# Patient Record
Sex: Female | Born: 1955 | Race: White | Hispanic: No | Smoking: Former smoker
Health system: Southern US, Community
[De-identification: ages and names within clinical notes are randomized; demographics above are authoritative.]

## PROBLEM LIST (undated history)

## (undated) DIAGNOSIS — J449 Chronic obstructive pulmonary disease, unspecified: Secondary | ICD-10-CM

## (undated) DIAGNOSIS — K219 Gastro-esophageal reflux disease without esophagitis: Secondary | ICD-10-CM

## (undated) DIAGNOSIS — I5189 Other ill-defined heart diseases: Secondary | ICD-10-CM

## (undated) DIAGNOSIS — K08109 Complete loss of teeth, unspecified cause, unspecified class: Secondary | ICD-10-CM

## (undated) DIAGNOSIS — C3490 Malignant neoplasm of unspecified part of unspecified bronchus or lung: Secondary | ICD-10-CM

## (undated) DIAGNOSIS — R911 Solitary pulmonary nodule: Secondary | ICD-10-CM

## (undated) DIAGNOSIS — R7303 Prediabetes: Secondary | ICD-10-CM

## (undated) DIAGNOSIS — E119 Type 2 diabetes mellitus without complications: Secondary | ICD-10-CM

## (undated) DIAGNOSIS — Z972 Presence of dental prosthetic device (complete) (partial): Secondary | ICD-10-CM

## (undated) DIAGNOSIS — F329 Major depressive disorder, single episode, unspecified: Secondary | ICD-10-CM

## (undated) DIAGNOSIS — J189 Pneumonia, unspecified organism: Secondary | ICD-10-CM

## (undated) DIAGNOSIS — Z8719 Personal history of other diseases of the digestive system: Secondary | ICD-10-CM

## (undated) DIAGNOSIS — F32A Depression, unspecified: Secondary | ICD-10-CM

## (undated) DIAGNOSIS — E785 Hyperlipidemia, unspecified: Secondary | ICD-10-CM

## (undated) DIAGNOSIS — I2510 Atherosclerotic heart disease of native coronary artery without angina pectoris: Secondary | ICD-10-CM

## (undated) DIAGNOSIS — Z9981 Dependence on supplemental oxygen: Secondary | ICD-10-CM

## (undated) DIAGNOSIS — I272 Pulmonary hypertension, unspecified: Secondary | ICD-10-CM

## (undated) DIAGNOSIS — Z8614 Personal history of Methicillin resistant Staphylococcus aureus infection: Secondary | ICD-10-CM

## (undated) DIAGNOSIS — IMO0001 Reserved for inherently not codable concepts without codable children: Secondary | ICD-10-CM

## (undated) DIAGNOSIS — I509 Heart failure, unspecified: Secondary | ICD-10-CM

## (undated) DIAGNOSIS — J42 Unspecified chronic bronchitis: Secondary | ICD-10-CM

## (undated) DIAGNOSIS — M1990 Unspecified osteoarthritis, unspecified site: Secondary | ICD-10-CM

## (undated) DIAGNOSIS — E669 Obesity, unspecified: Secondary | ICD-10-CM

## (undated) HISTORY — DX: Hyperlipidemia, unspecified: E78.5

## (undated) HISTORY — DX: Solitary pulmonary nodule: R91.1

## (undated) HISTORY — PX: APPENDECTOMY: SHX54

## (undated) HISTORY — DX: Obesity, unspecified: E66.9

## (undated) HISTORY — PX: NISSEN FUNDOPLICATION: SHX2091

## (undated) HISTORY — DX: Prediabetes: R73.03

## (undated) HISTORY — DX: Personal history of Methicillin resistant Staphylococcus aureus infection: Z86.14

## (undated) HISTORY — DX: Chronic obstructive pulmonary disease, unspecified: J44.9

## (undated) HISTORY — DX: Atherosclerotic heart disease of native coronary artery without angina pectoris: I25.10

## (undated) HISTORY — DX: Other ill-defined heart diseases: I51.89

## (undated) HISTORY — PX: BACK SURGERY: SHX140

## (undated) HISTORY — PX: TONSILLECTOMY: SUR1361

## (undated) HISTORY — PX: KNEE ARTHROSCOPY: SHX127

## (undated) HISTORY — DX: Gastro-esophageal reflux disease without esophagitis: K21.9

## (undated) HISTORY — DX: Pulmonary hypertension, unspecified: I27.20

## (undated) HISTORY — PX: HERNIA REPAIR: SHX51

---

## 1977-04-04 HISTORY — PX: TUBAL LIGATION: SHX77

## 1977-04-04 HISTORY — PX: BILATERAL OOPHORECTOMY: SHX1221

## 1978-12-04 HISTORY — PX: ABDOMINAL HYSTERECTOMY: SHX81

## 1988-12-03 HISTORY — PX: BREAST LUMPECTOMY: SHX2

## 1997-10-30 ENCOUNTER — Encounter: Admission: RE | Admit: 1997-10-30 | Discharge: 1998-01-28 | Payer: Self-pay | Admitting: Family Medicine

## 1998-01-07 ENCOUNTER — Ambulatory Visit: Admission: RE | Admit: 1998-01-07 | Discharge: 1998-01-07 | Payer: Self-pay | Admitting: Gynecology

## 1998-01-13 ENCOUNTER — Inpatient Hospital Stay (HOSPITAL_COMMUNITY): Admission: RE | Admit: 1998-01-13 | Discharge: 1998-01-16 | Payer: Self-pay | Admitting: Obstetrics and Gynecology

## 1998-05-02 ENCOUNTER — Encounter: Payer: Self-pay | Admitting: Family Medicine

## 1998-05-02 ENCOUNTER — Ambulatory Visit (HOSPITAL_COMMUNITY): Admission: RE | Admit: 1998-05-02 | Discharge: 1998-05-02 | Payer: Self-pay | Admitting: Family Medicine

## 1998-07-30 ENCOUNTER — Ambulatory Visit (HOSPITAL_COMMUNITY): Admission: RE | Admit: 1998-07-30 | Discharge: 1998-07-30 | Payer: Self-pay | Admitting: Gastroenterology

## 1999-04-05 HISTORY — PX: LUMBAR DISC SURGERY: SHX700

## 1999-06-11 ENCOUNTER — Encounter: Payer: Self-pay | Admitting: Emergency Medicine

## 1999-06-11 ENCOUNTER — Emergency Department (HOSPITAL_COMMUNITY): Admission: EM | Admit: 1999-06-11 | Discharge: 1999-06-11 | Payer: Self-pay | Admitting: Emergency Medicine

## 1999-06-11 ENCOUNTER — Encounter: Payer: Self-pay | Admitting: Internal Medicine

## 2002-05-08 ENCOUNTER — Emergency Department (HOSPITAL_COMMUNITY): Admission: EM | Admit: 2002-05-08 | Discharge: 2002-05-08 | Payer: Self-pay | Admitting: Emergency Medicine

## 2003-05-02 ENCOUNTER — Emergency Department (HOSPITAL_COMMUNITY): Admission: EM | Admit: 2003-05-02 | Discharge: 2003-05-02 | Payer: Self-pay | Admitting: Family Medicine

## 2003-05-07 ENCOUNTER — Emergency Department (HOSPITAL_COMMUNITY): Admission: AD | Admit: 2003-05-07 | Discharge: 2003-05-07 | Payer: Self-pay | Admitting: Family Medicine

## 2003-05-16 ENCOUNTER — Emergency Department (HOSPITAL_COMMUNITY): Admission: EM | Admit: 2003-05-16 | Discharge: 2003-05-16 | Payer: Self-pay | Admitting: Family Medicine

## 2003-11-02 ENCOUNTER — Emergency Department (HOSPITAL_COMMUNITY): Admission: EM | Admit: 2003-11-02 | Discharge: 2003-11-02 | Payer: Self-pay | Admitting: Emergency Medicine

## 2003-11-14 ENCOUNTER — Encounter: Admission: RE | Admit: 2003-11-14 | Discharge: 2003-11-14 | Payer: Self-pay | Admitting: Family Medicine

## 2003-12-01 ENCOUNTER — Ambulatory Visit (HOSPITAL_COMMUNITY): Admission: RE | Admit: 2003-12-01 | Discharge: 2003-12-01 | Payer: Self-pay | Admitting: *Deleted

## 2004-09-24 ENCOUNTER — Encounter: Admission: RE | Admit: 2004-09-24 | Discharge: 2004-09-24 | Payer: Self-pay | Admitting: Family Medicine

## 2004-10-08 ENCOUNTER — Ambulatory Visit: Payer: Self-pay | Admitting: Critical Care Medicine

## 2004-10-14 ENCOUNTER — Ambulatory Visit: Payer: Self-pay | Admitting: Cardiology

## 2004-10-14 ENCOUNTER — Ambulatory Visit: Admission: RE | Admit: 2004-10-14 | Discharge: 2004-10-14 | Payer: Self-pay | Admitting: Critical Care Medicine

## 2006-02-27 ENCOUNTER — Ambulatory Visit: Payer: Self-pay | Admitting: Family Medicine

## 2006-05-02 ENCOUNTER — Ambulatory Visit: Payer: Self-pay | Admitting: Family Medicine

## 2006-06-20 IMAGING — CR DG CHEST 2V
2 series · 2 of 2 positions shown · non-contrast
Comparison: none

CLINICAL DATA: Shortness of breath.  Bilateral lower extremity edema.  Smoker. 
 CHEST, TWO VIEWS

[view not recorded (1 of 2)]
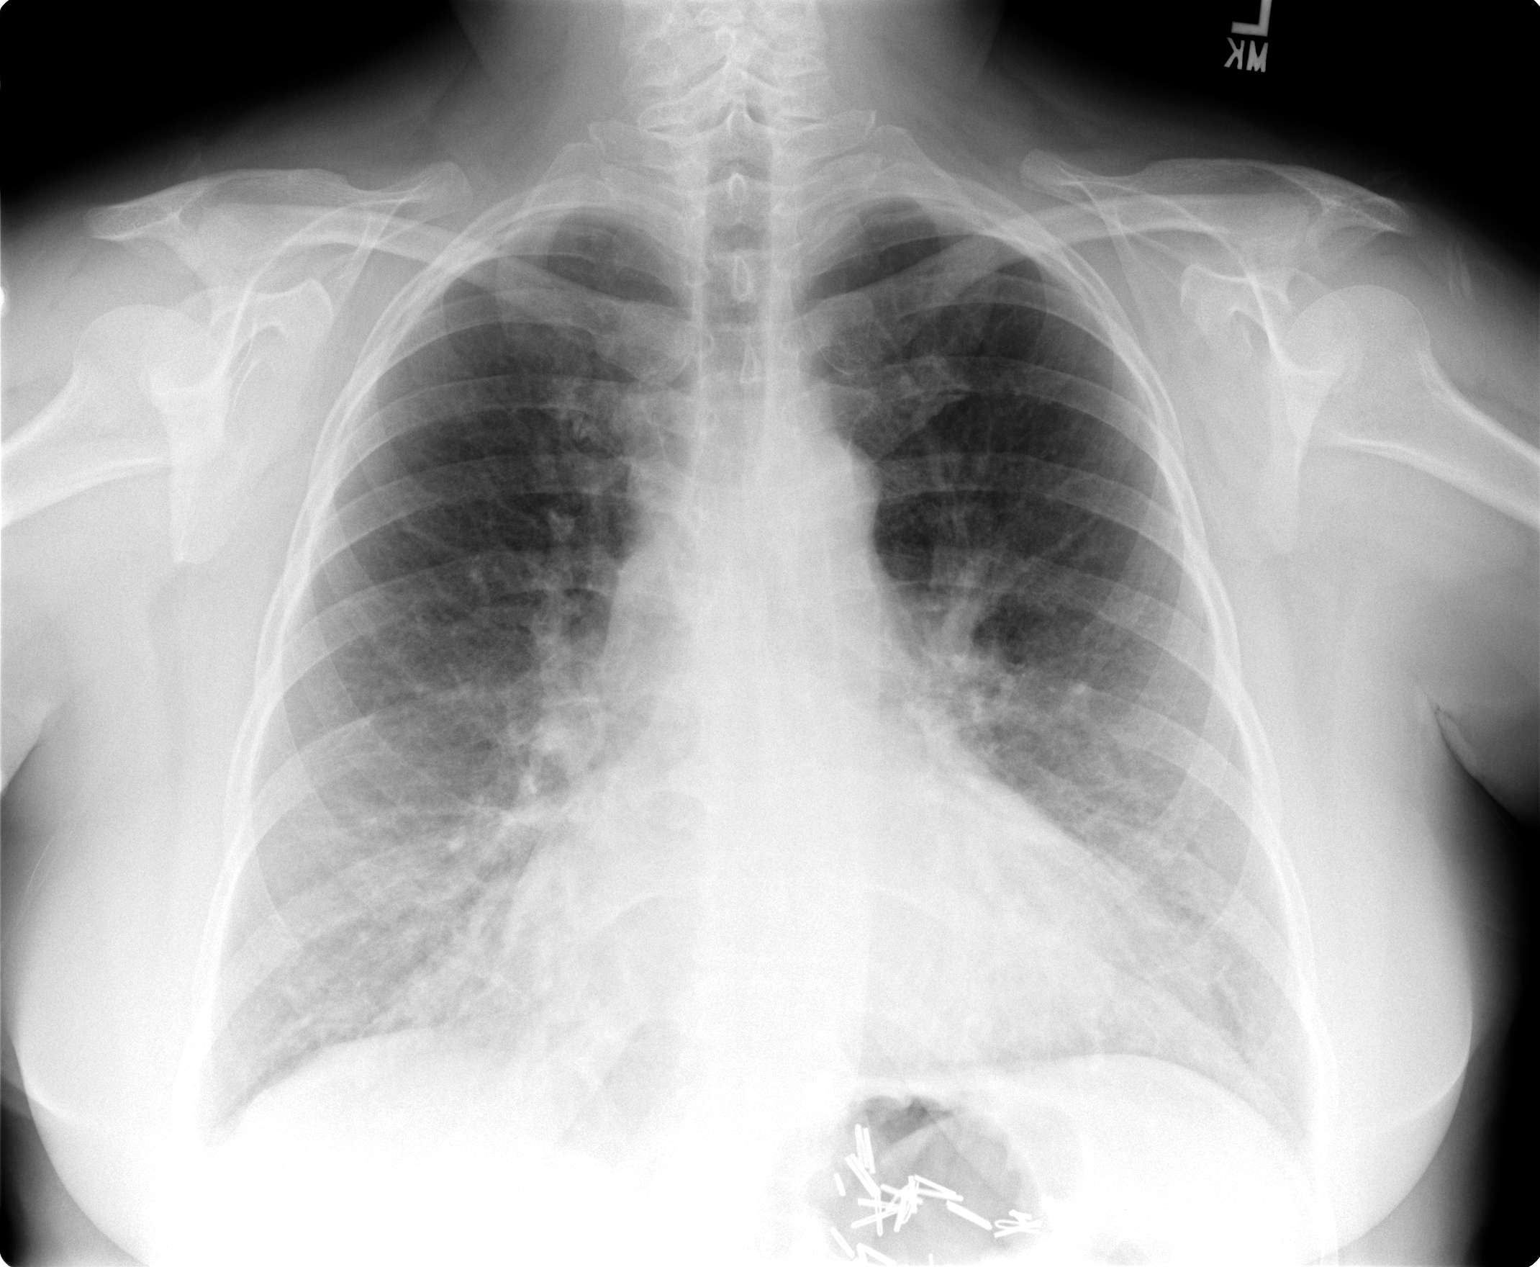

[view not recorded (2 of 2)]
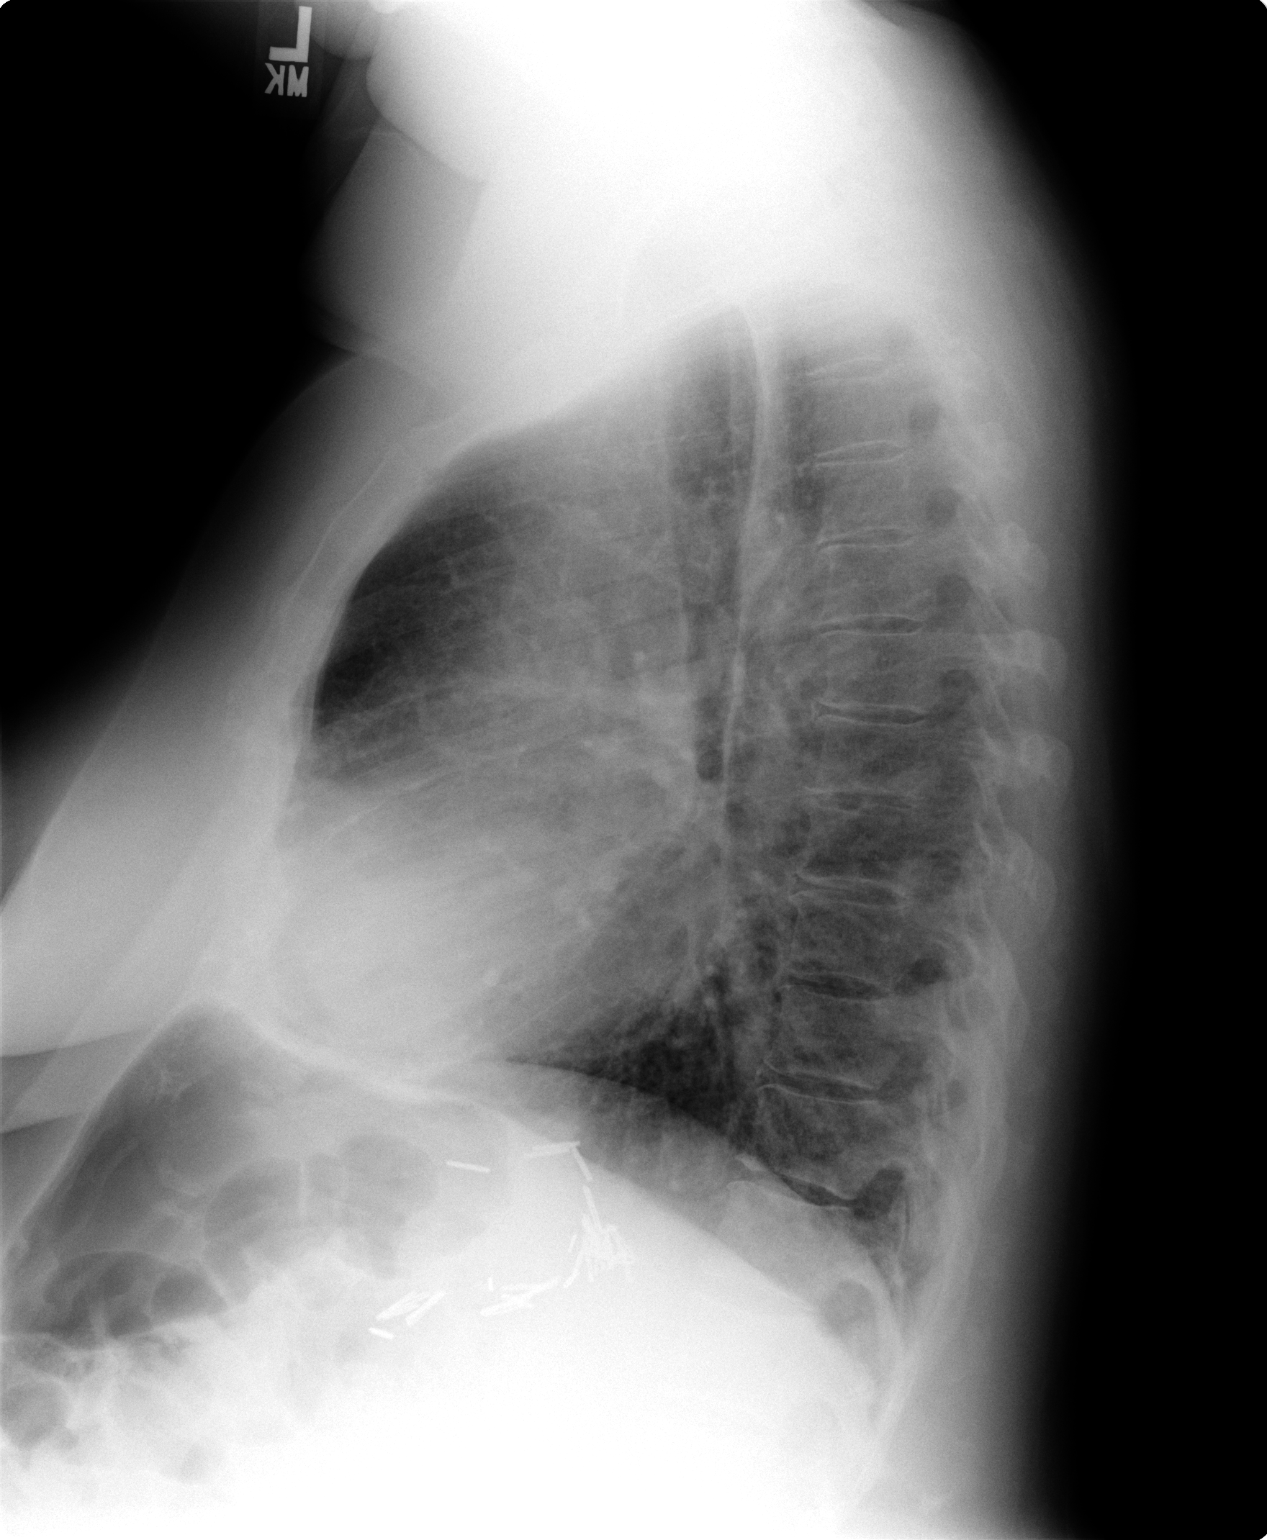

[2 of 2 positions shown; findings below may reference images not displayed]

FINDINGS: Comparison is made with [REDACTED] chest x-ray report 06/11/99.  Since that study again seen is slight cardiomegaly with perihilar interstitial edema.  No effusions are noted. Surgical clips are seen at the gastroesophageal junction region. 
 IMPRESSION
 Slight congestive heart failure.

## 2006-07-07 IMAGING — CR DG CHEST 2V
2 series · 2 of 2 positions shown · non-contrast
Comparison: 11/14/03.

CLINICAL DATA: Shortness of breath.  Follow-up congestive heart failure.  
 TWO VIEW CHEST

[view not recorded (1 of 2)]
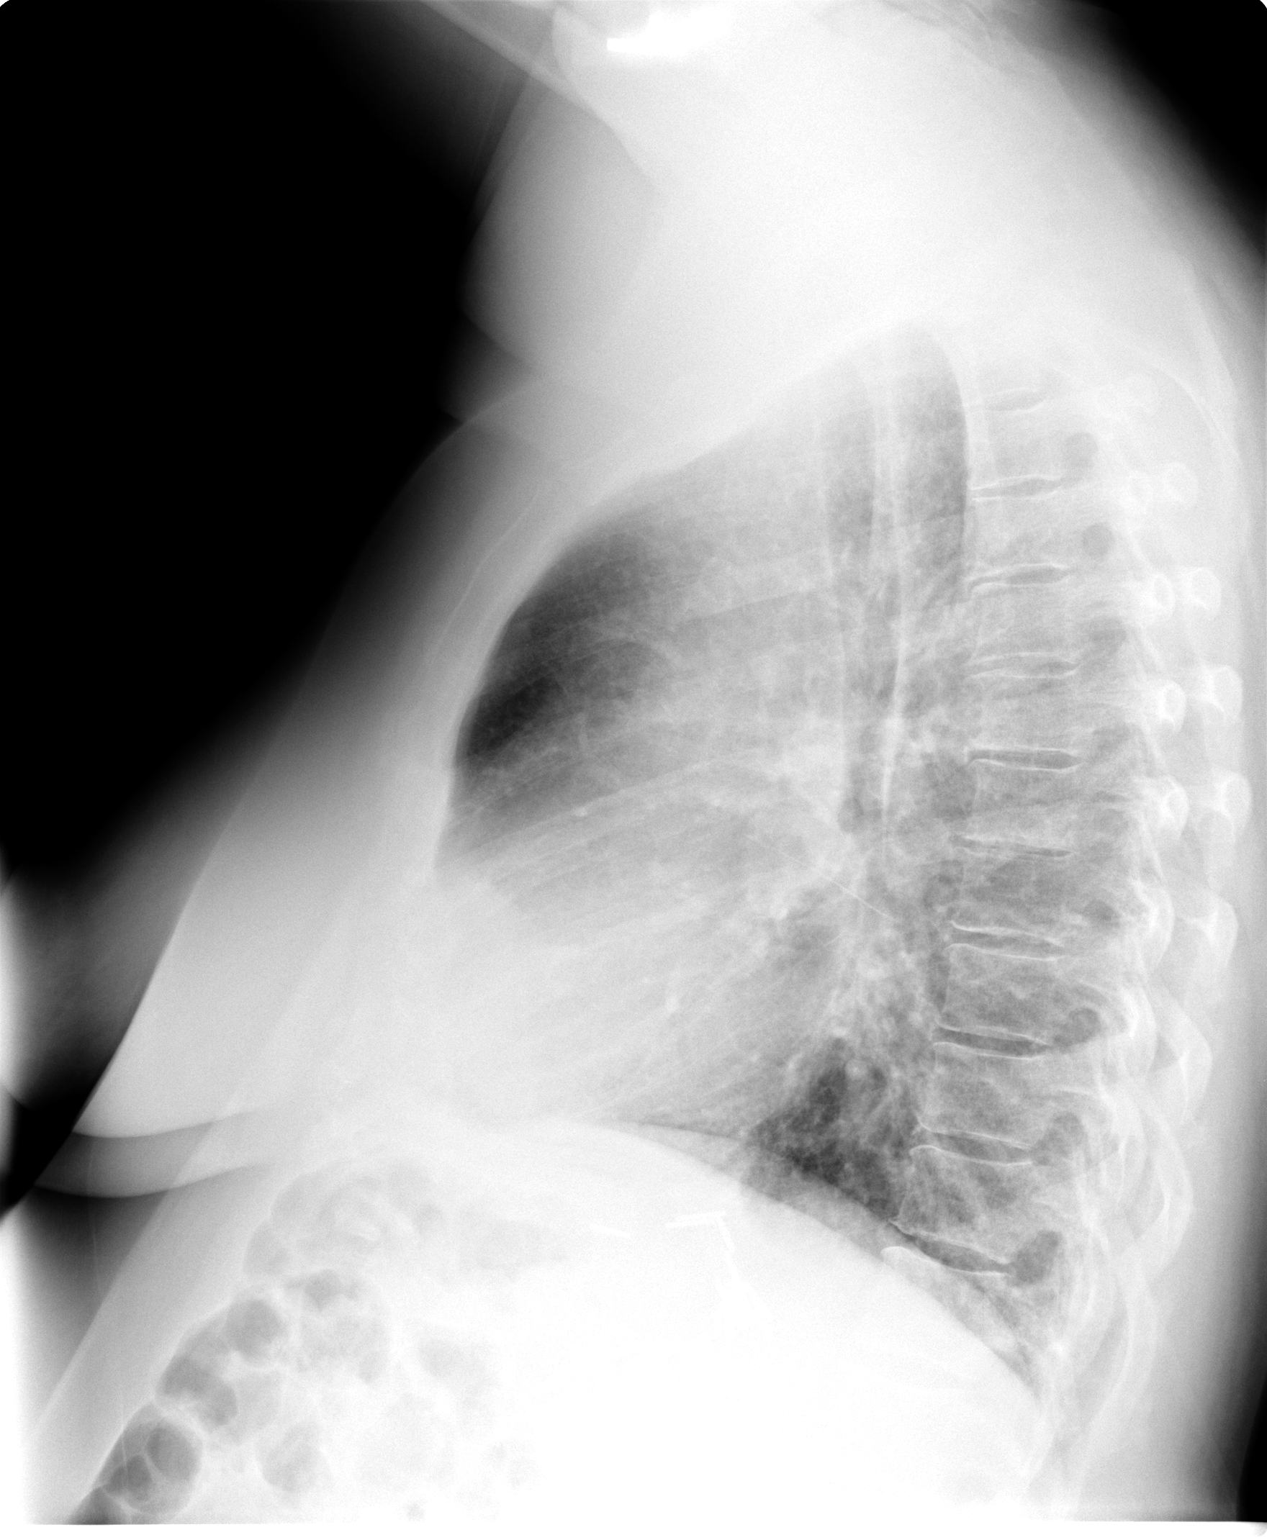

[view not recorded (2 of 2)]
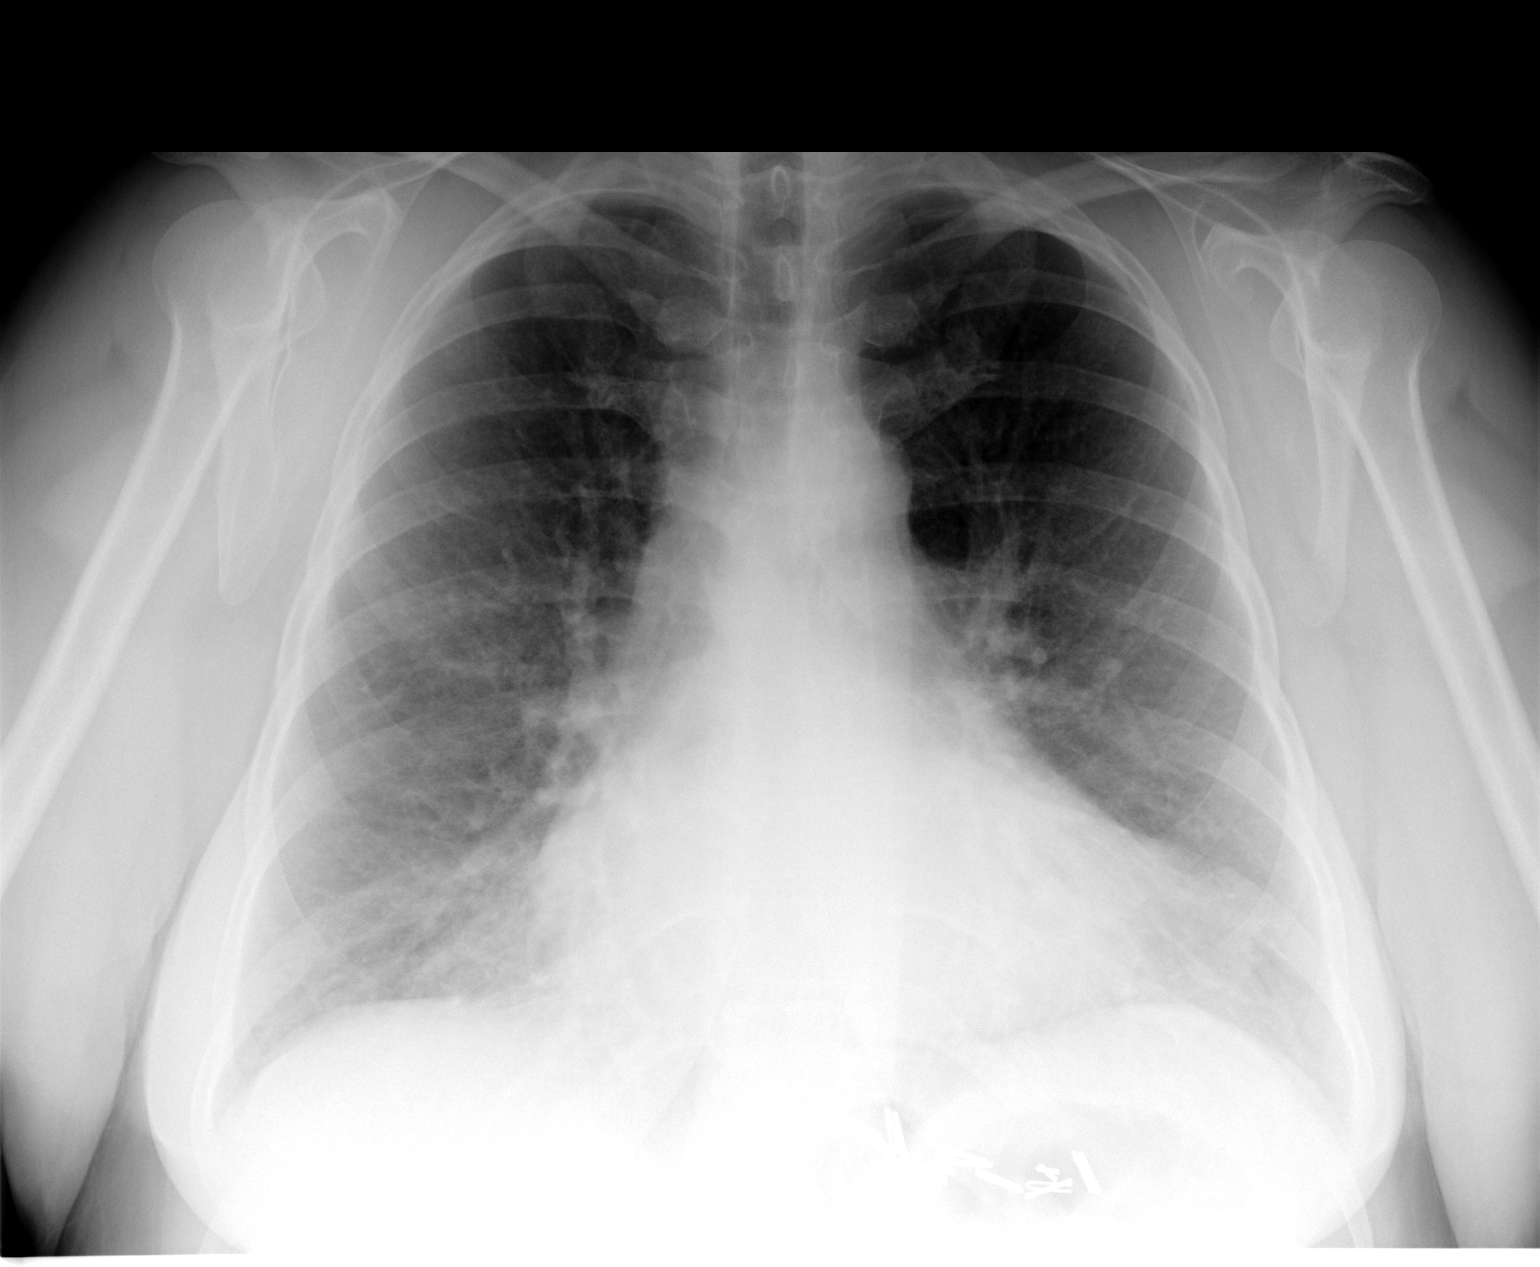

[2 of 2 positions shown; findings below may reference images not displayed]

Mild to moderate cardiomegaly is stable.  Pulmonary vascular redistribution is unchanged and consistent with chronic pulmonary venous hypertension.  There is no evidence of frank edema or focal infiltrate.  There is no evidence of pleural effusion.  No significant change is seen since the previous study.  Numerous surgical clips are again seen in the epigastric region.
 IMPRESSION
 Stable cardiomegaly and chronic pulmonary venous hypertension.  No acute pulmonary process.

## 2006-07-07 IMAGING — NM NM PULM PERFUSION & VENT (REBREATHING & WASHOUT)
1 series · 6 of 6 positions shown · non-contrast
Comparison: none

CLINICAL DATA: Progressive shortness of breath.  Evaluate for pulmonary embolism.  
 VENTILATION ? PERFUSION LUNG SCAN 
 Perfusion images were obtained in multiple projections following the intravenous injection of 3.3 mCi VVmUc MAA.  Ventilation images were subsequently obtained in the posterior projection using 30 mCi [id number] gas.  Comparison is made with chest radiograph also performed today. 
 The perfusion images show uniform distribution of Xenon activity throughout both lungs during the wash in phase.   Retention of Xenon activity is seen in both lungs which is symmetric, and raises suspicion for COPD.  However no focal areas of abnormal Xenon retention are noted in either lung. 
 Perfusion images show a diffusely heterogeneous pattern of radiopharmaceutical distribution throughout both lungs.  There are several small peripheral perfusion defects involving both lungs mainly in the bases however there is no evidence of moderate or large segmental perfusion defects in either lung.  These findings are consistent with a low probability of pulmonary embolism based on PIOPED criteria.    
 IMPRESSION
 Low probability for pulmonary embolism.

[vq lung vent/perf · 3.22mm/px · 6 of 12 frames shown]
[frame 2/12  full-range]
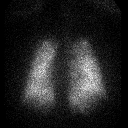
[frame 4/12  full-range]
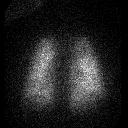
[frame 6/12  full-range]
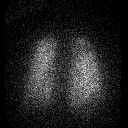
[frame 8/12  full-range]
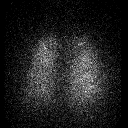
[frame 10/12  full-range]
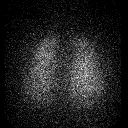
[frame 12/12  full-range]
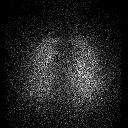

[6 of 6 positions shown; findings below may reference images not displayed]

## 2006-11-01 ENCOUNTER — Emergency Department (HOSPITAL_COMMUNITY): Admission: EM | Admit: 2006-11-01 | Discharge: 2006-11-01 | Payer: Self-pay | Admitting: Emergency Medicine

## 2007-05-01 IMAGING — CR DG CHEST 2V
2 series · 2 of 2 positions shown · non-contrast
Comparison: none

CLINICAL DATA: Short of breath, smoker, history of pulmonary hypertension. 
 CHEST, TWO VIEWS: 
 Since [REDACTED] chest x-ray 12/01/03 there is no interval change with stable slight cardiomegaly, pulmonary venous hypertension changes with the lungs otherwise clear.  Surgical clips at the superior stomach region are stable.  No other significant change nor abnormality is seen.

[w chest pa]
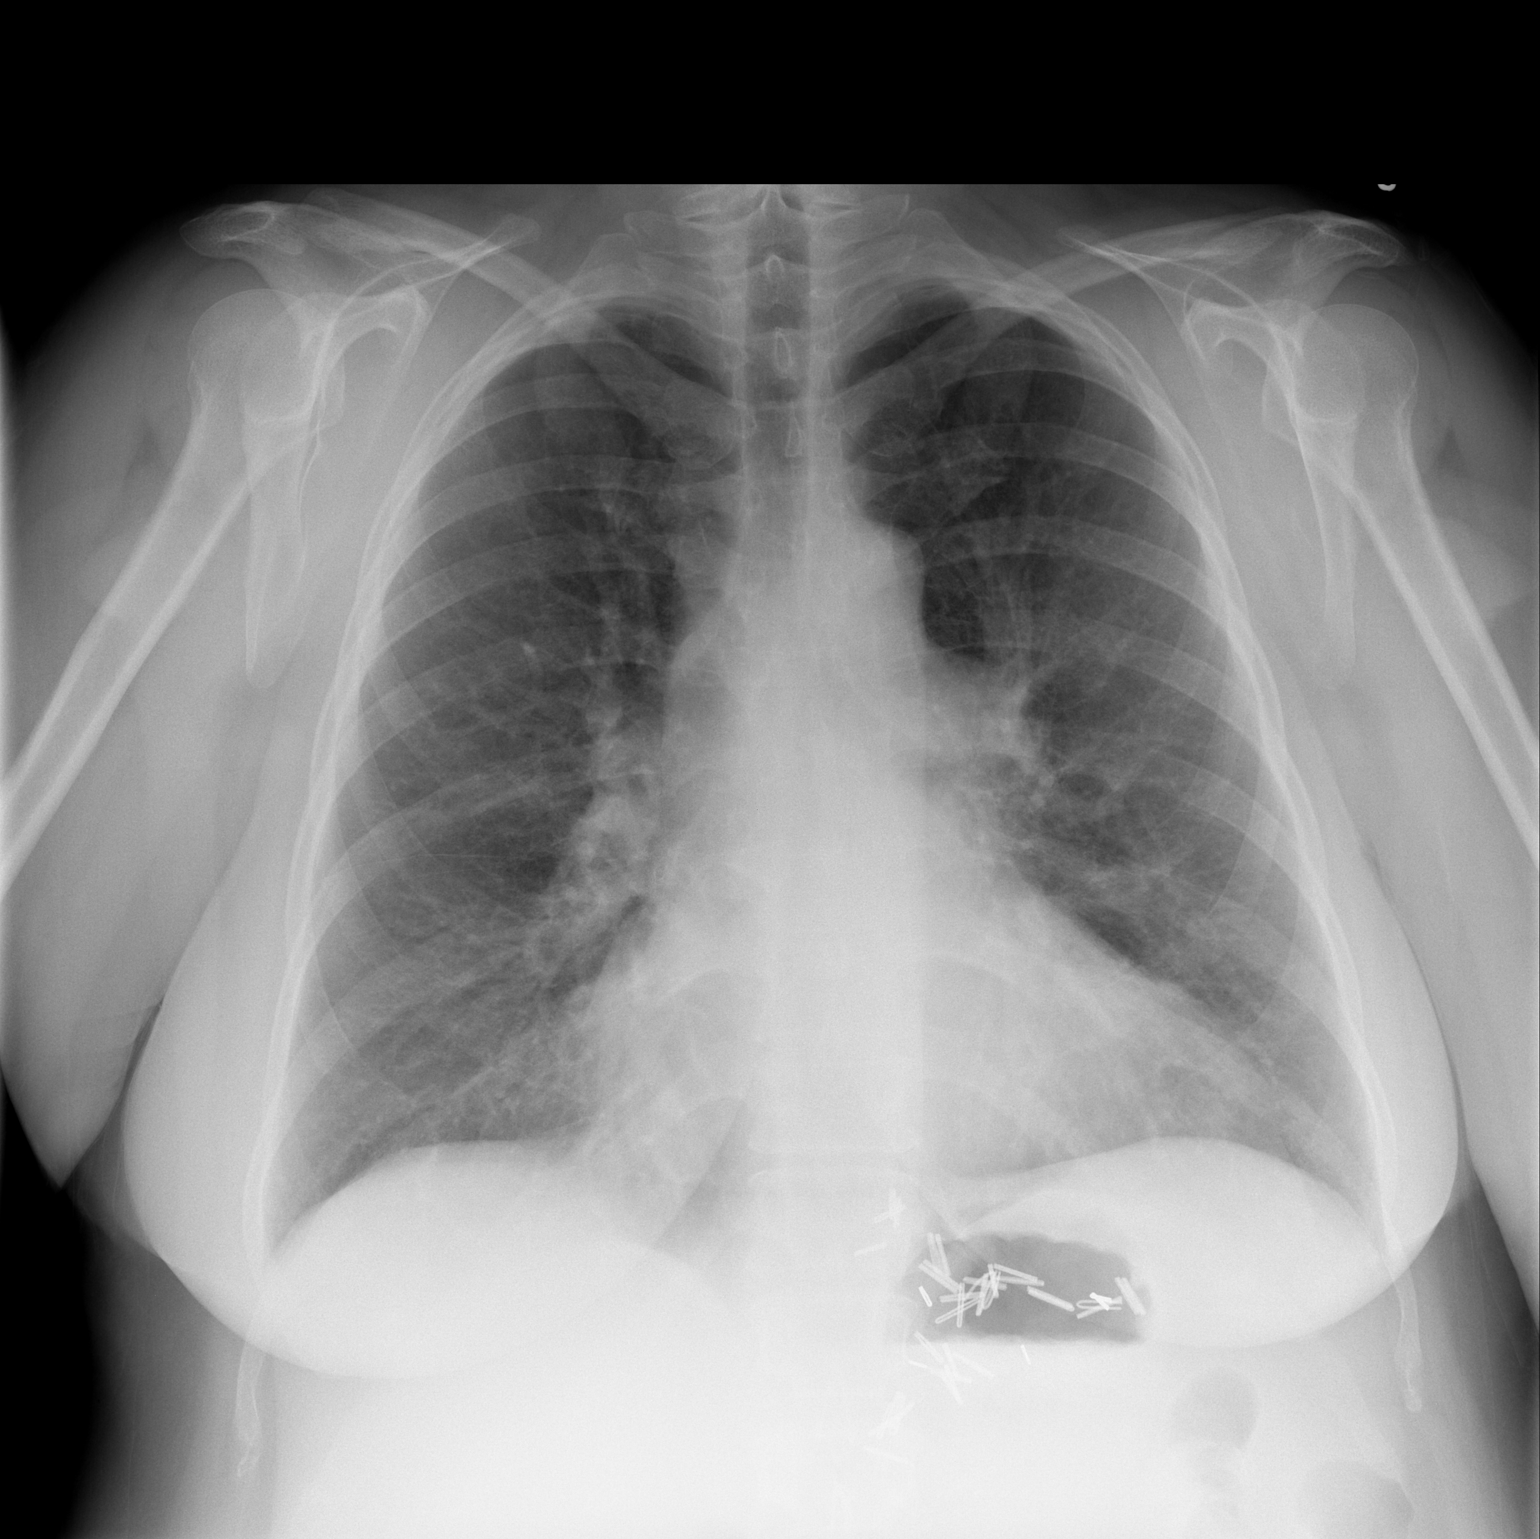

[w chest lat]
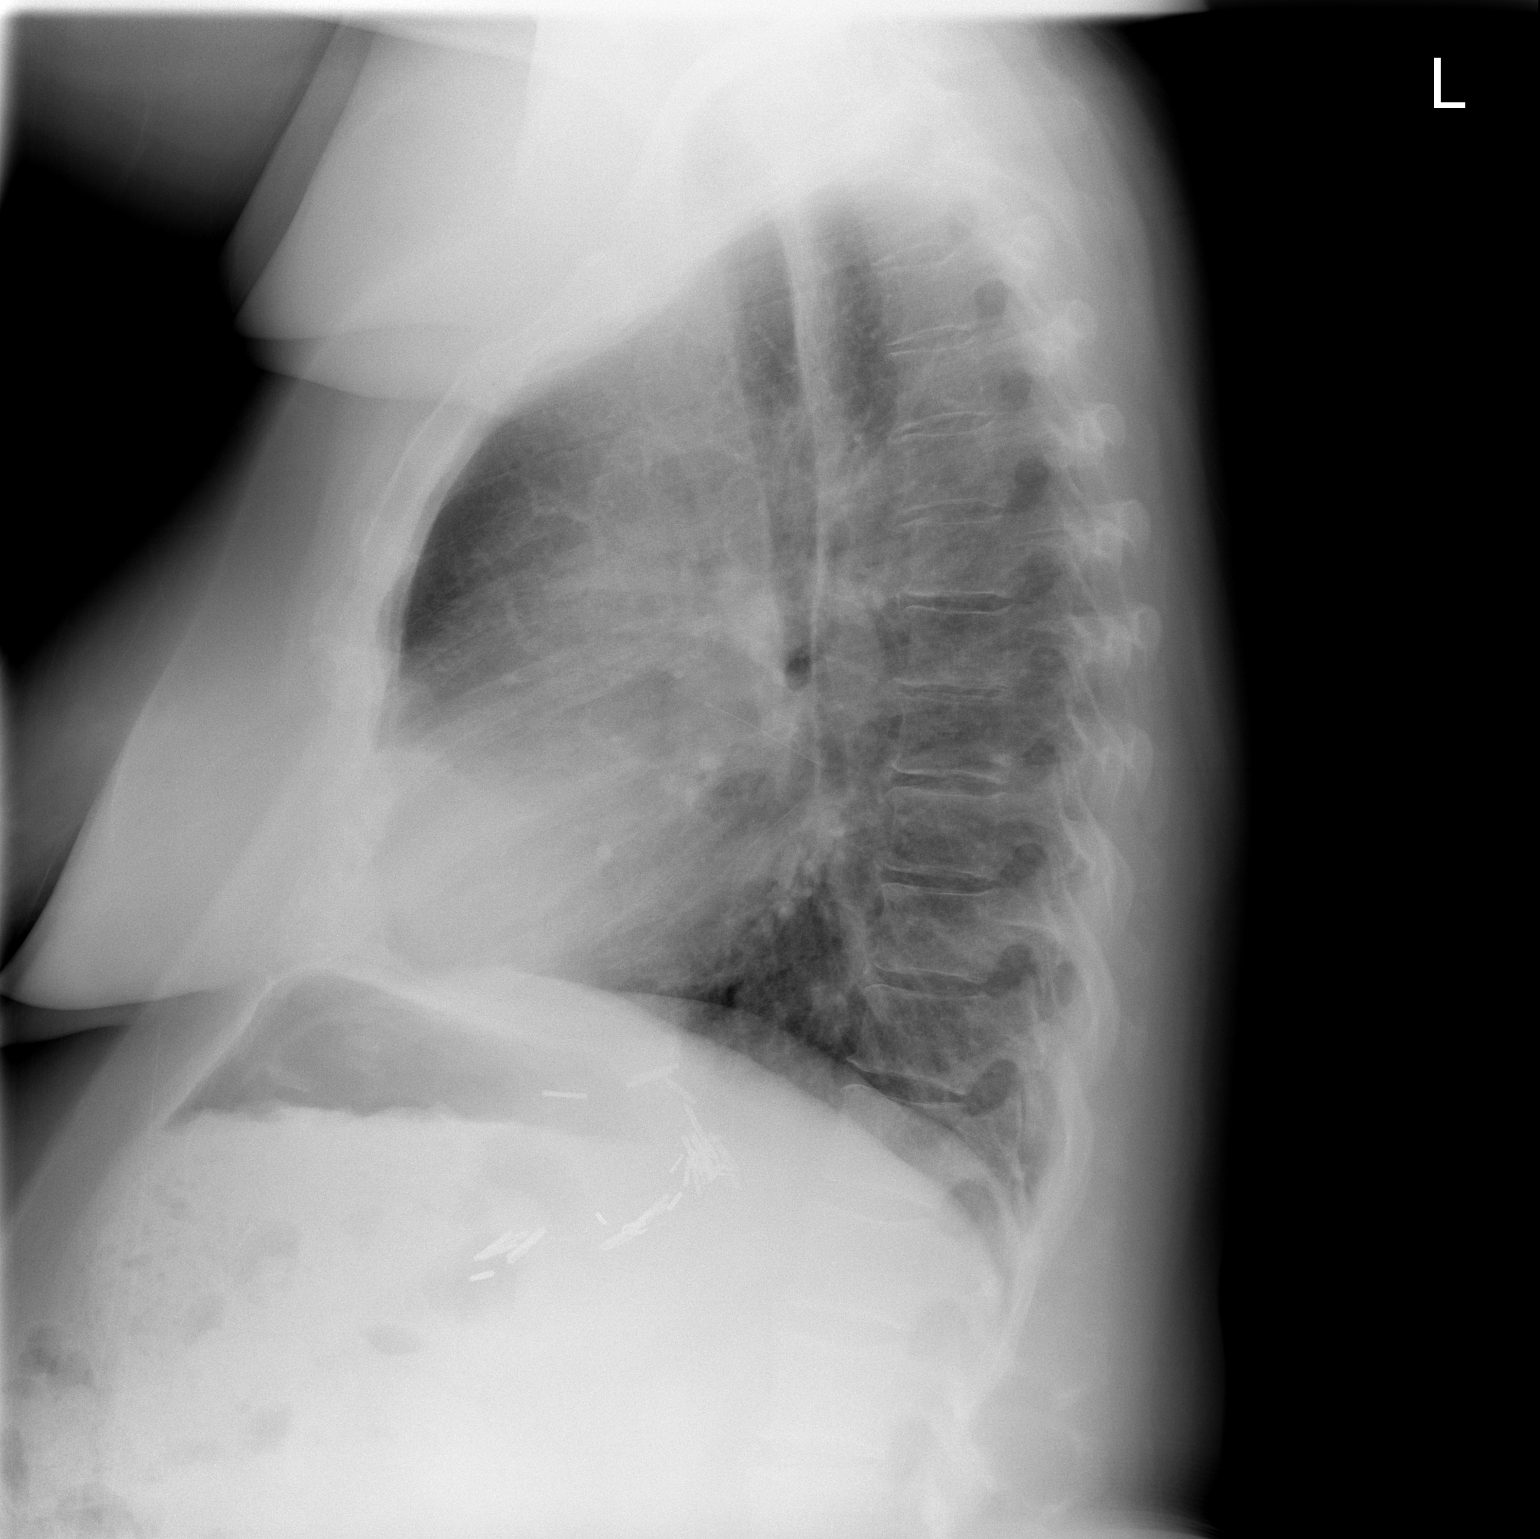

[2 of 2 positions shown; findings below may reference images not displayed]

IMPRESSION: Since [REDACTED] chest x-ray 12/01/03 no interval change: 
 1.  Stable slight cardiomegaly and chronic pulmonary venous hypertension. 
 2.  No active disease.

## 2007-05-21 IMAGING — CT CT ANGIO CHEST
2 of 3 series · 19 of 37 positions shown · IV contrast (APPLIED)
Comparison: none

HISTORY: Dyspnea, question pulmonary embolism

[Series 5: pe 1.0 b20f st · axial · 0.74mm/px · z∈[-341,-77]mm · 16 of 294 slices shown]
[im 15/294  lung]
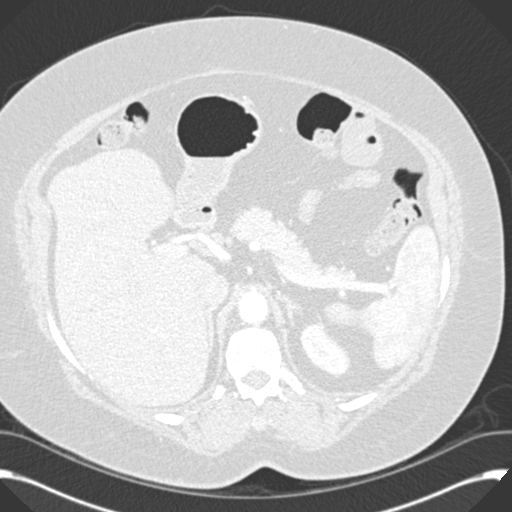
[im 30/294  mediastinal]
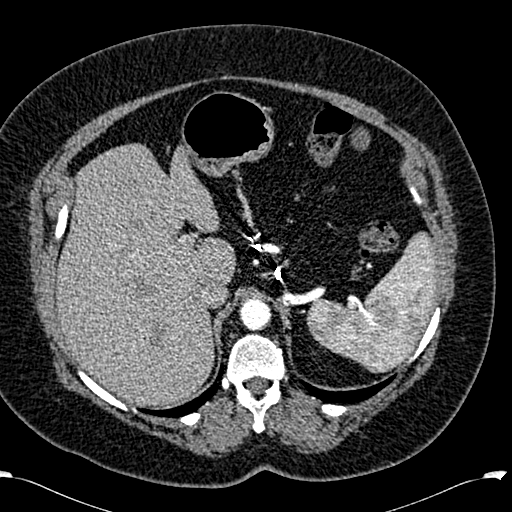
[im 59/294  lung]
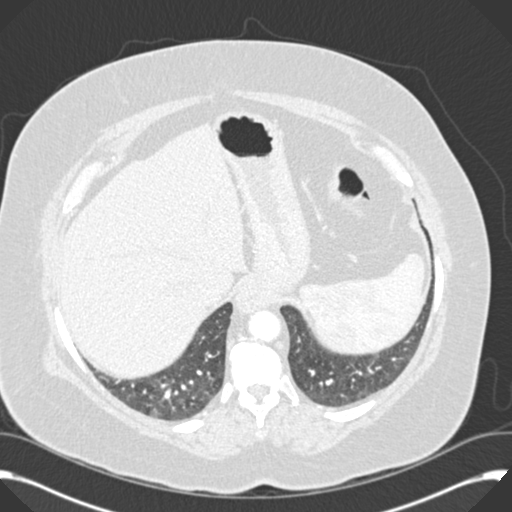
[im 74/294  mediastinal]
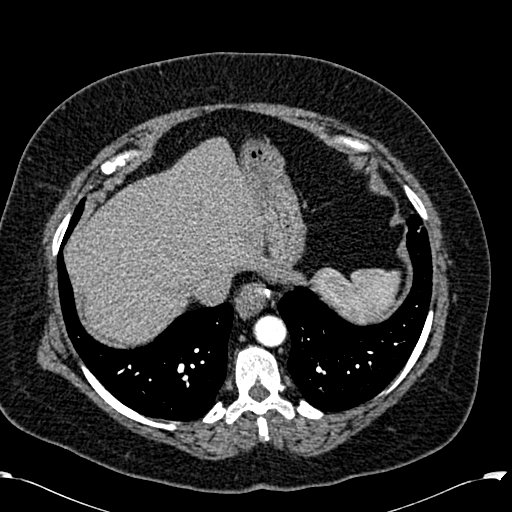
[im 88/294  lung]
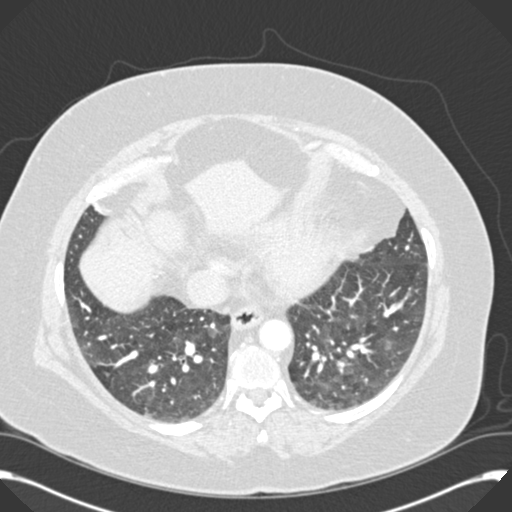
[im 103/294  mediastinal]
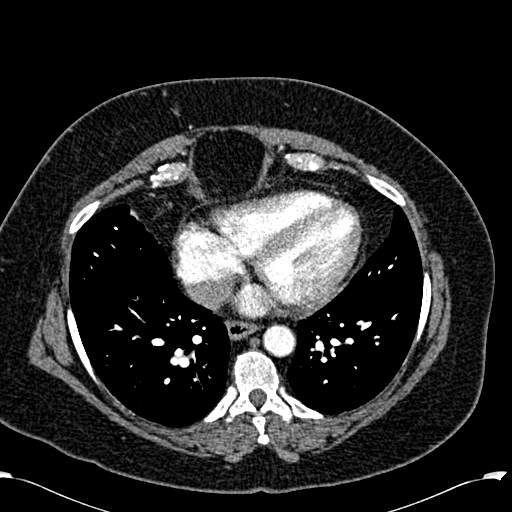
[im 132/294  lung]
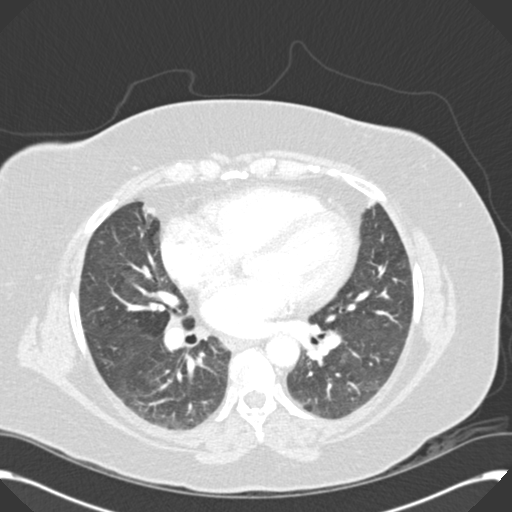
[im 147/294  mediastinal]
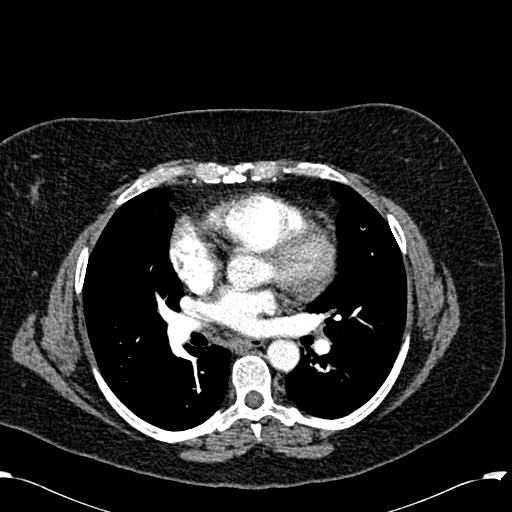
[im 162/294  lung]
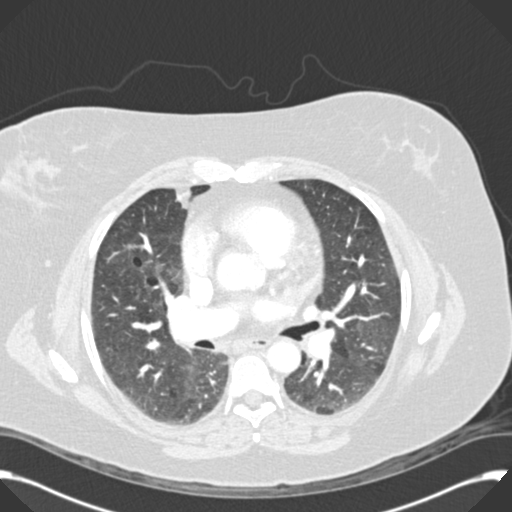
[im 176/294  mediastinal]
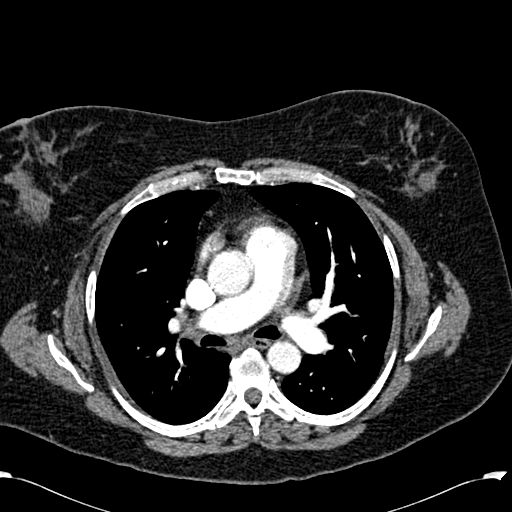
[im 191/294  lung]
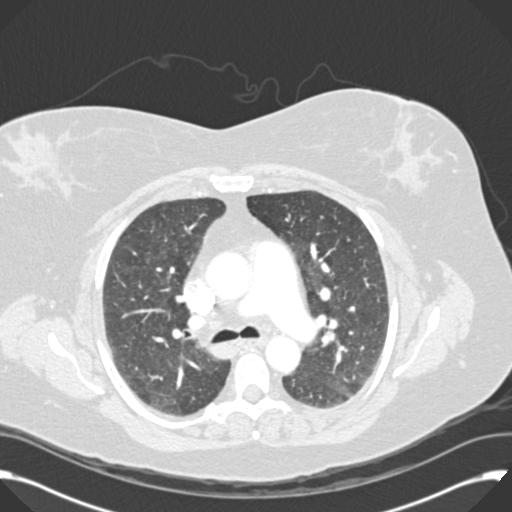
[im 206/294  mediastinal]
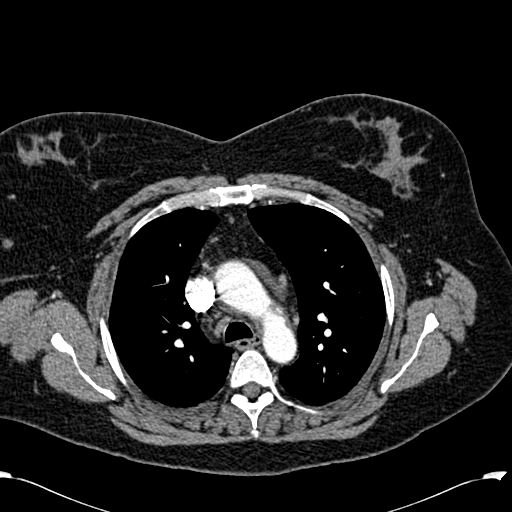
[im 220/294  lung]
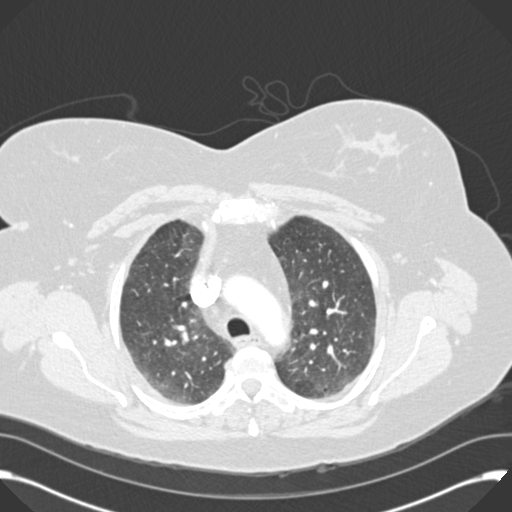
[im 235/294  mediastinal]
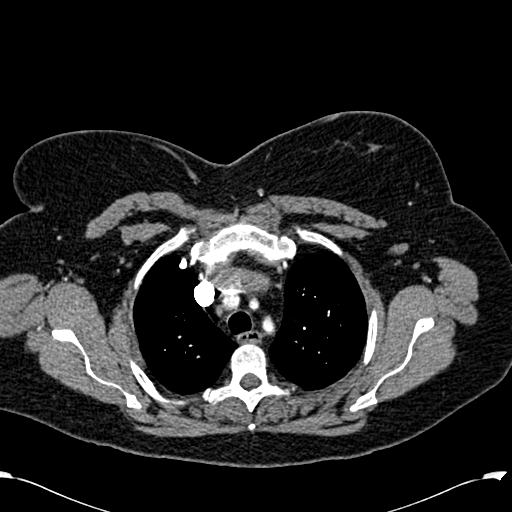
[im 264/294  lung]
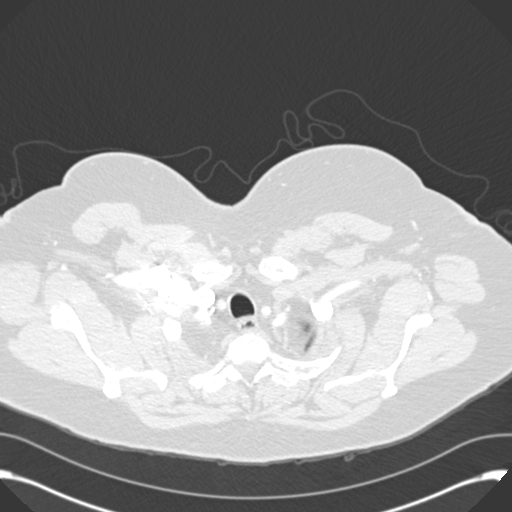
[im 279/294  mediastinal]
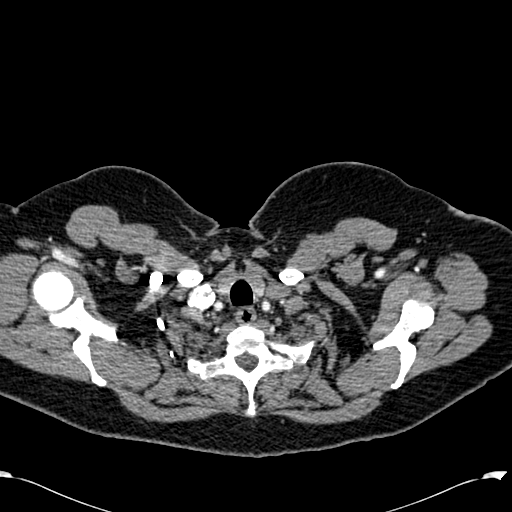

[Series 604: z radial range · coronal · 0.74mm/px · 3 of 40 slices shown]
[im 8/40  mediastinal]
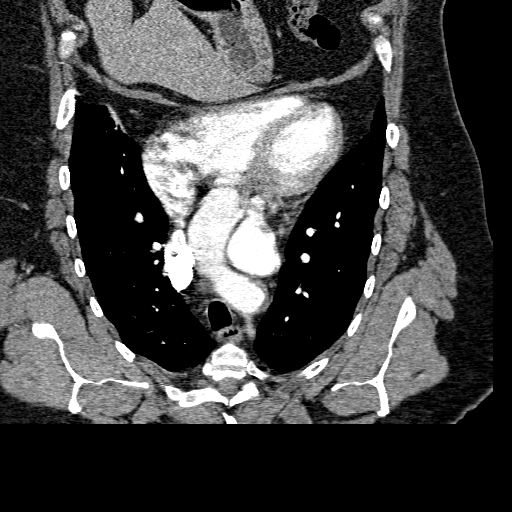
[im 16/40  mediastinal]
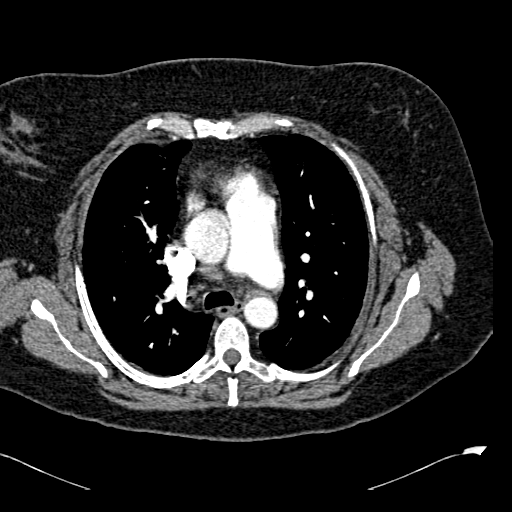
[im 20/40  mediastinal]
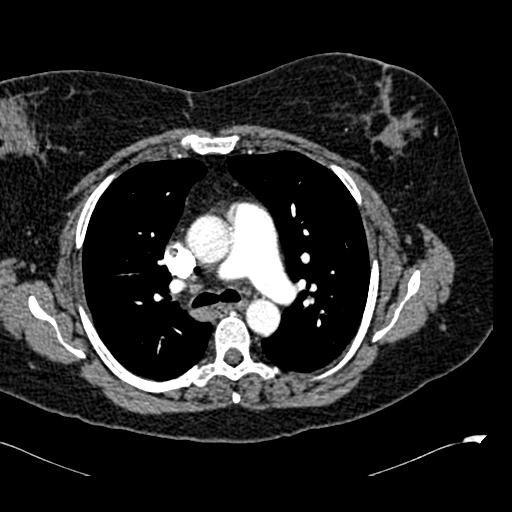

[19 of 37 positions shown; findings below may reference images not displayed]

CT ANGIO CHEST:

Multidetector helical CT imaging chest performed using pulmonary embolism
protocol following 150 cc Bmnipaque-4MM. Additional coronal and sagittal images
are reconstructed from the axial data set to assist in evaluation of the
thoracic vasculature.

Aorta normal caliber without aneurysm or dissection.
Pulmonary arteries patent without evidence of pulmonary embolism.
Single minimally prominent precarinal lymph node, 1.4 x 1.2 cm, image 33.
No other thoracic adenopathy.
Scattered mild groundglass infiltrate in mid and lower lungs bilaterally,
nonspecific, can be seen with alveolitis, edema. 
No pneumothorax.

Nonspecific 3 mm diameter right middle lobe nodule, image 52.
No other pulmonary nodules or pleural effusion.
Minimal subsegmental atelectasis medial right middle lobe.
Surgical clips at GE junction, question prior hiatal hernia repair or antireflux
procedure.
Remainder of upper abdomen unremarkable.
IMPRESSION: Mild scattered groundglass infiltrates, nonspecific.
No evidence of pulmonary embolism or other acute thoracic process.
3 mm diameter nonspecific right middle lobe nodule.
If patient has history of smoking, recommend followup CT in 6 months to confirm
nodule stability.

## 2007-06-06 ENCOUNTER — Inpatient Hospital Stay (HOSPITAL_COMMUNITY): Admission: EM | Admit: 2007-06-06 | Discharge: 2007-06-12 | Payer: Self-pay | Admitting: Emergency Medicine

## 2007-06-07 ENCOUNTER — Encounter (INDEPENDENT_AMBULATORY_CARE_PROVIDER_SITE_OTHER): Payer: Self-pay | Admitting: Emergency Medicine

## 2007-08-11 ENCOUNTER — Emergency Department (HOSPITAL_COMMUNITY): Admission: EM | Admit: 2007-08-11 | Discharge: 2007-08-11 | Payer: Self-pay | Admitting: Emergency Medicine

## 2007-09-28 ENCOUNTER — Emergency Department (HOSPITAL_COMMUNITY): Admission: EM | Admit: 2007-09-28 | Discharge: 2007-09-28 | Payer: Self-pay | Admitting: Emergency Medicine

## 2008-04-04 HISTORY — PX: CORONARY ANGIOPLASTY WITH STENT PLACEMENT: SHX49

## 2008-09-17 ENCOUNTER — Emergency Department (HOSPITAL_COMMUNITY): Admission: EM | Admit: 2008-09-17 | Discharge: 2008-09-18 | Payer: Self-pay | Admitting: Emergency Medicine

## 2008-10-22 ENCOUNTER — Emergency Department (HOSPITAL_COMMUNITY): Admission: EM | Admit: 2008-10-22 | Discharge: 2008-10-23 | Payer: Self-pay | Admitting: Emergency Medicine

## 2008-12-10 ENCOUNTER — Ambulatory Visit: Payer: Self-pay | Admitting: Internal Medicine

## 2008-12-10 ENCOUNTER — Ambulatory Visit: Payer: Self-pay | Admitting: Pulmonary Disease

## 2008-12-10 ENCOUNTER — Inpatient Hospital Stay (HOSPITAL_COMMUNITY): Admission: EM | Admit: 2008-12-10 | Discharge: 2008-12-14 | Payer: Self-pay | Admitting: Emergency Medicine

## 2008-12-10 ENCOUNTER — Encounter (INDEPENDENT_AMBULATORY_CARE_PROVIDER_SITE_OTHER): Payer: Self-pay | Admitting: Internal Medicine

## 2008-12-11 ENCOUNTER — Encounter (INDEPENDENT_AMBULATORY_CARE_PROVIDER_SITE_OTHER): Payer: Self-pay | Admitting: Internal Medicine

## 2008-12-11 ENCOUNTER — Encounter: Payer: Self-pay | Admitting: Internal Medicine

## 2008-12-11 ENCOUNTER — Encounter: Payer: Self-pay | Admitting: Pulmonary Disease

## 2008-12-11 ENCOUNTER — Ambulatory Visit: Payer: Self-pay | Admitting: Vascular Surgery

## 2008-12-14 ENCOUNTER — Encounter: Payer: Self-pay | Admitting: Internal Medicine

## 2008-12-22 ENCOUNTER — Ambulatory Visit: Payer: Self-pay | Admitting: Internal Medicine

## 2008-12-22 ENCOUNTER — Encounter: Payer: Self-pay | Admitting: Internal Medicine

## 2008-12-22 DIAGNOSIS — L0291 Cutaneous abscess, unspecified: Secondary | ICD-10-CM

## 2008-12-22 DIAGNOSIS — I519 Heart disease, unspecified: Secondary | ICD-10-CM

## 2008-12-22 DIAGNOSIS — Z794 Long term (current) use of insulin: Secondary | ICD-10-CM

## 2008-12-22 DIAGNOSIS — Z720 Tobacco use: Secondary | ICD-10-CM

## 2008-12-22 DIAGNOSIS — J449 Chronic obstructive pulmonary disease, unspecified: Secondary | ICD-10-CM | POA: Insufficient documentation

## 2008-12-22 DIAGNOSIS — E119 Type 2 diabetes mellitus without complications: Secondary | ICD-10-CM

## 2008-12-22 DIAGNOSIS — E785 Hyperlipidemia, unspecified: Secondary | ICD-10-CM

## 2008-12-22 DIAGNOSIS — R918 Other nonspecific abnormal finding of lung field: Secondary | ICD-10-CM | POA: Insufficient documentation

## 2008-12-22 DIAGNOSIS — L0390 Cellulitis, unspecified: Secondary | ICD-10-CM

## 2008-12-22 DIAGNOSIS — K219 Gastro-esophageal reflux disease without esophagitis: Secondary | ICD-10-CM

## 2008-12-22 DIAGNOSIS — I2510 Atherosclerotic heart disease of native coronary artery without angina pectoris: Secondary | ICD-10-CM

## 2008-12-31 DIAGNOSIS — A4902 Methicillin resistant Staphylococcus aureus infection, unspecified site: Secondary | ICD-10-CM | POA: Insufficient documentation

## 2008-12-31 DIAGNOSIS — J40 Bronchitis, not specified as acute or chronic: Secondary | ICD-10-CM

## 2008-12-31 DIAGNOSIS — I509 Heart failure, unspecified: Secondary | ICD-10-CM | POA: Insufficient documentation

## 2008-12-31 DIAGNOSIS — G4733 Obstructive sleep apnea (adult) (pediatric): Secondary | ICD-10-CM

## 2008-12-31 DIAGNOSIS — J449 Chronic obstructive pulmonary disease, unspecified: Secondary | ICD-10-CM

## 2008-12-31 DIAGNOSIS — Z6841 Body Mass Index (BMI) 40.0 and over, adult: Secondary | ICD-10-CM

## 2008-12-31 DIAGNOSIS — R0602 Shortness of breath: Secondary | ICD-10-CM

## 2009-01-01 ENCOUNTER — Ambulatory Visit: Payer: Self-pay | Admitting: Cardiology

## 2009-01-05 ENCOUNTER — Ambulatory Visit: Payer: Self-pay | Admitting: Critical Care Medicine

## 2009-01-05 DIAGNOSIS — I272 Pulmonary hypertension, unspecified: Secondary | ICD-10-CM | POA: Insufficient documentation

## 2009-01-06 ENCOUNTER — Telehealth (INDEPENDENT_AMBULATORY_CARE_PROVIDER_SITE_OTHER): Payer: Self-pay | Admitting: *Deleted

## 2009-01-08 ENCOUNTER — Telehealth: Payer: Self-pay | Admitting: Cardiology

## 2009-01-08 ENCOUNTER — Telehealth: Payer: Self-pay | Admitting: Internal Medicine

## 2009-01-09 ENCOUNTER — Telehealth: Payer: Self-pay | Admitting: Critical Care Medicine

## 2009-01-12 ENCOUNTER — Telehealth: Payer: Self-pay | Admitting: Critical Care Medicine

## 2009-01-12 ENCOUNTER — Encounter: Payer: Self-pay | Admitting: Internal Medicine

## 2009-01-19 ENCOUNTER — Telehealth: Payer: Self-pay | Admitting: Cardiology

## 2009-01-19 ENCOUNTER — Encounter: Payer: Self-pay | Admitting: Cardiology

## 2009-01-29 ENCOUNTER — Telehealth: Payer: Self-pay | Admitting: Cardiology

## 2009-01-30 ENCOUNTER — Telehealth (INDEPENDENT_AMBULATORY_CARE_PROVIDER_SITE_OTHER): Payer: Self-pay | Admitting: *Deleted

## 2009-02-02 ENCOUNTER — Telehealth (INDEPENDENT_AMBULATORY_CARE_PROVIDER_SITE_OTHER): Payer: Self-pay | Admitting: *Deleted

## 2009-02-04 ENCOUNTER — Inpatient Hospital Stay (HOSPITAL_COMMUNITY): Admission: AD | Admit: 2009-02-04 | Discharge: 2009-02-08 | Payer: Self-pay | Admitting: Internal Medicine

## 2009-02-04 ENCOUNTER — Ambulatory Visit: Payer: Self-pay | Admitting: Internal Medicine

## 2009-02-04 ENCOUNTER — Ambulatory Visit: Payer: Self-pay | Admitting: Cardiovascular Disease

## 2009-02-04 ENCOUNTER — Ambulatory Visit: Payer: Self-pay | Admitting: Emergency Medicine

## 2009-02-04 DIAGNOSIS — L0293 Carbuncle, unspecified: Secondary | ICD-10-CM

## 2009-02-04 DIAGNOSIS — L0292 Furuncle, unspecified: Secondary | ICD-10-CM | POA: Insufficient documentation

## 2009-02-05 ENCOUNTER — Encounter: Payer: Self-pay | Admitting: Cardiovascular Disease

## 2009-02-06 ENCOUNTER — Encounter (INDEPENDENT_AMBULATORY_CARE_PROVIDER_SITE_OTHER): Payer: Self-pay | Admitting: Internal Medicine

## 2009-02-09 ENCOUNTER — Telehealth (INDEPENDENT_AMBULATORY_CARE_PROVIDER_SITE_OTHER): Payer: Self-pay | Admitting: Internal Medicine

## 2009-02-12 ENCOUNTER — Telehealth: Payer: Self-pay | Admitting: Internal Medicine

## 2009-02-16 ENCOUNTER — Ambulatory Visit: Payer: Self-pay | Admitting: Internal Medicine

## 2009-02-17 LAB — CONVERTED CEMR LAB
AST: 14 units/L (ref 0–37)
Albumin: 3.8 g/dL (ref 3.5–5.2)
BUN: 10 mg/dL (ref 6–23)
CO2: 32 meq/L (ref 19–32)
Calcium: 9.6 mg/dL (ref 8.4–10.5)
Chloride: 98 meq/L (ref 96–112)
Cholesterol: 180 mg/dL (ref 0–200)
LDL Cholesterol: 109 mg/dL — ABNORMAL HIGH (ref 0–99)
Potassium: 4.2 meq/L (ref 3.5–5.3)
Total Bilirubin: 0.4 mg/dL (ref 0.3–1.2)
Total CHOL/HDL Ratio: 4.3
VLDL: 29 mg/dL (ref 0–40)

## 2009-02-18 ENCOUNTER — Ambulatory Visit: Payer: Self-pay | Admitting: Critical Care Medicine

## 2009-02-18 ENCOUNTER — Telehealth: Payer: Self-pay | Admitting: Cardiology

## 2009-02-19 ENCOUNTER — Encounter (INDEPENDENT_AMBULATORY_CARE_PROVIDER_SITE_OTHER): Payer: Self-pay | Admitting: *Deleted

## 2009-02-20 ENCOUNTER — Telehealth: Payer: Self-pay | Admitting: Cardiology

## 2009-02-27 ENCOUNTER — Telehealth: Payer: Self-pay | Admitting: Cardiology

## 2009-03-03 ENCOUNTER — Ambulatory Visit: Payer: Self-pay | Admitting: Cardiology

## 2009-03-03 ENCOUNTER — Encounter: Payer: Self-pay | Admitting: Cardiology

## 2009-03-03 ENCOUNTER — Inpatient Hospital Stay (HOSPITAL_COMMUNITY): Admission: EM | Admit: 2009-03-03 | Discharge: 2009-03-05 | Payer: Self-pay | Admitting: Emergency Medicine

## 2009-03-05 ENCOUNTER — Encounter: Payer: Self-pay | Admitting: Cardiology

## 2009-03-06 ENCOUNTER — Telehealth: Payer: Self-pay | Admitting: Cardiology

## 2009-03-16 ENCOUNTER — Telehealth: Payer: Self-pay | Admitting: Cardiology

## 2009-04-02 ENCOUNTER — Encounter (INDEPENDENT_AMBULATORY_CARE_PROVIDER_SITE_OTHER): Payer: Self-pay | Admitting: Internal Medicine

## 2009-04-08 ENCOUNTER — Encounter: Payer: Self-pay | Admitting: Internal Medicine

## 2009-04-10 ENCOUNTER — Encounter: Payer: Self-pay | Admitting: Internal Medicine

## 2009-05-04 ENCOUNTER — Telehealth (INDEPENDENT_AMBULATORY_CARE_PROVIDER_SITE_OTHER): Payer: Self-pay | Admitting: *Deleted

## 2009-05-05 ENCOUNTER — Encounter: Payer: Self-pay | Admitting: Internal Medicine

## 2009-05-30 ENCOUNTER — Emergency Department (HOSPITAL_COMMUNITY): Admission: EM | Admit: 2009-05-30 | Discharge: 2009-05-30 | Payer: Self-pay | Admitting: Emergency Medicine

## 2009-06-07 IMAGING — CR DG CHEST 2V
2 series · 2 of 2 positions shown · non-contrast
Comparison: 09/24/04.

CLINICAL DATA: Shortness of breath with bilateral lower extremity swelling for three to four days.  History of chronic bronchitis.  
 PA AND LATERAL CHEST - 2 VIEW:

[w chest pa]
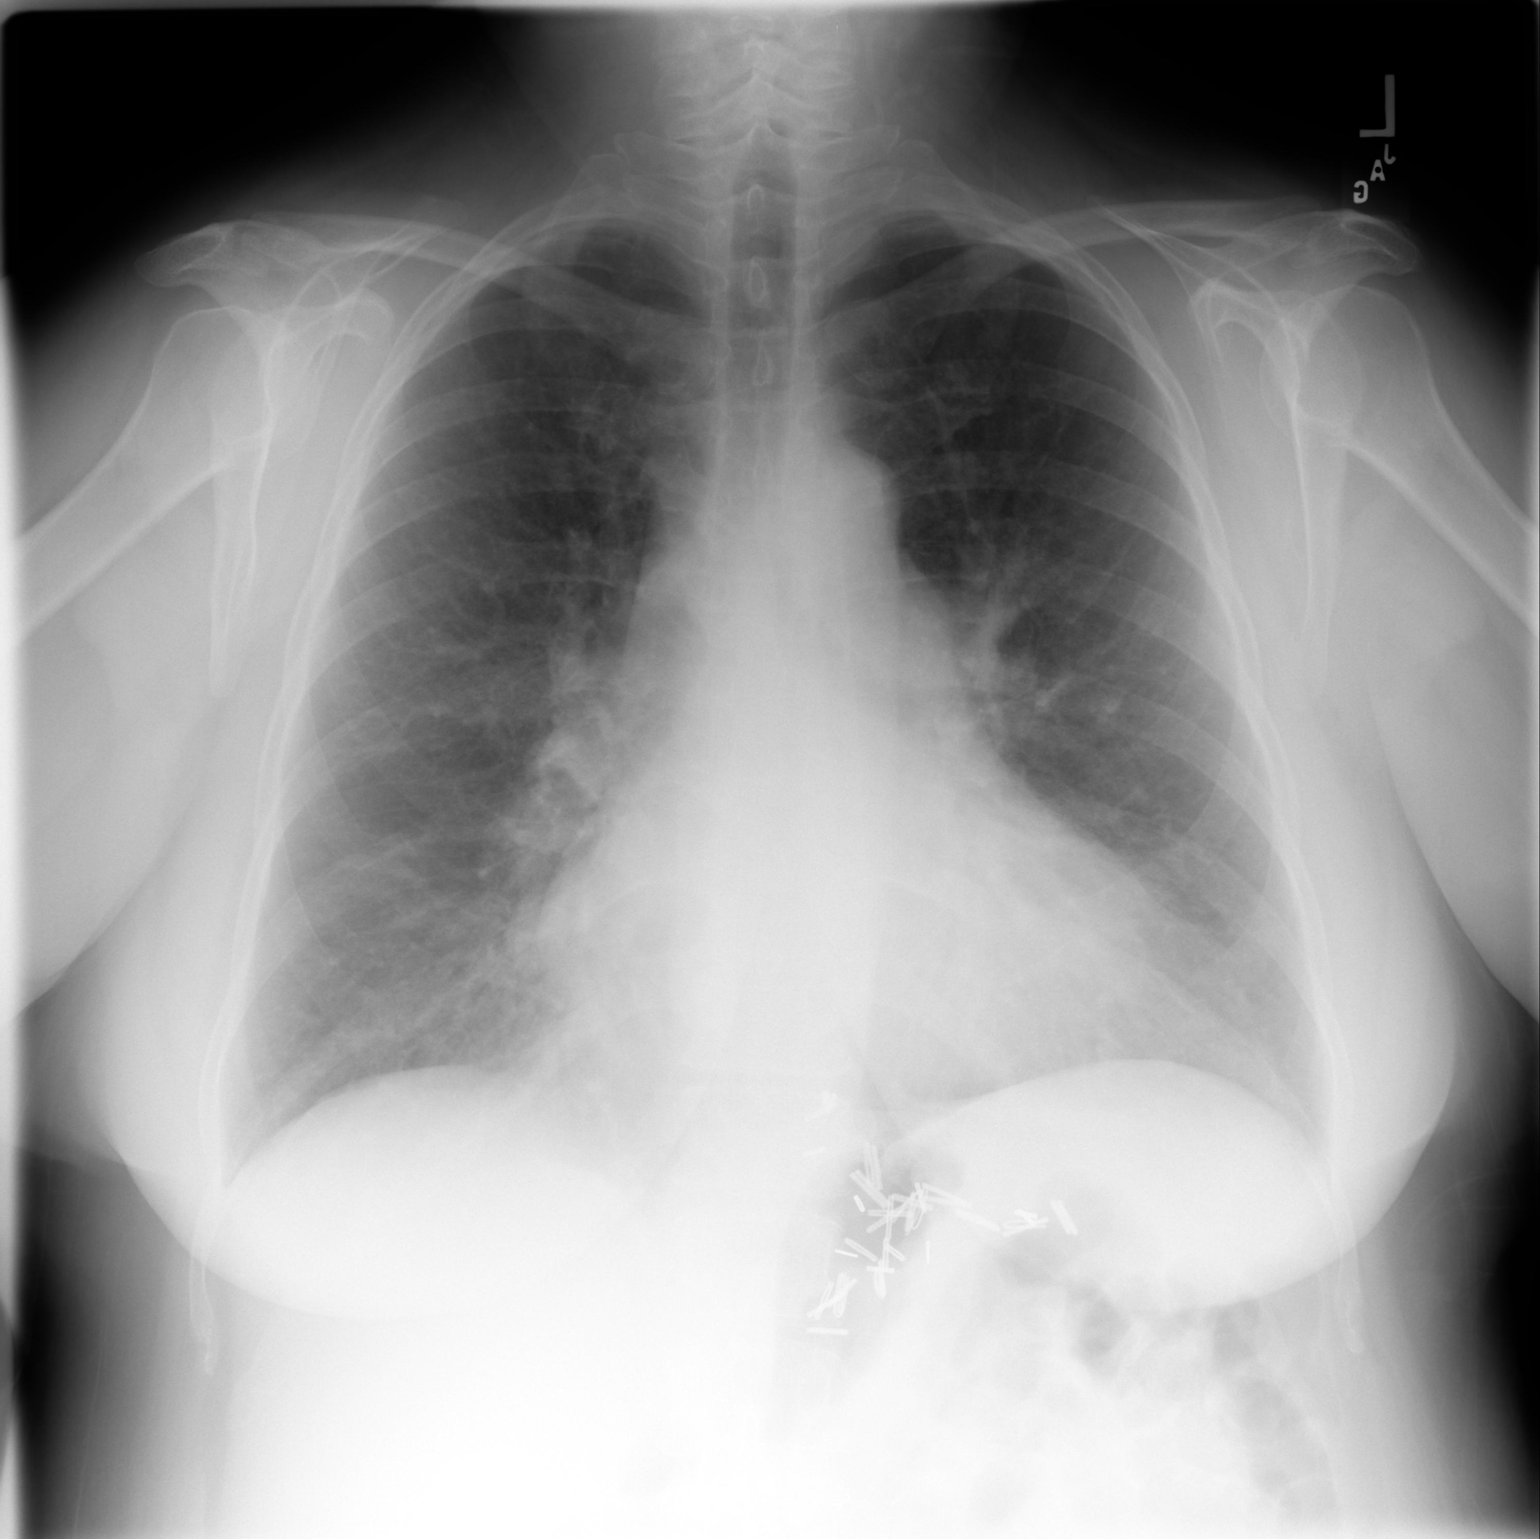

[w chest lat]
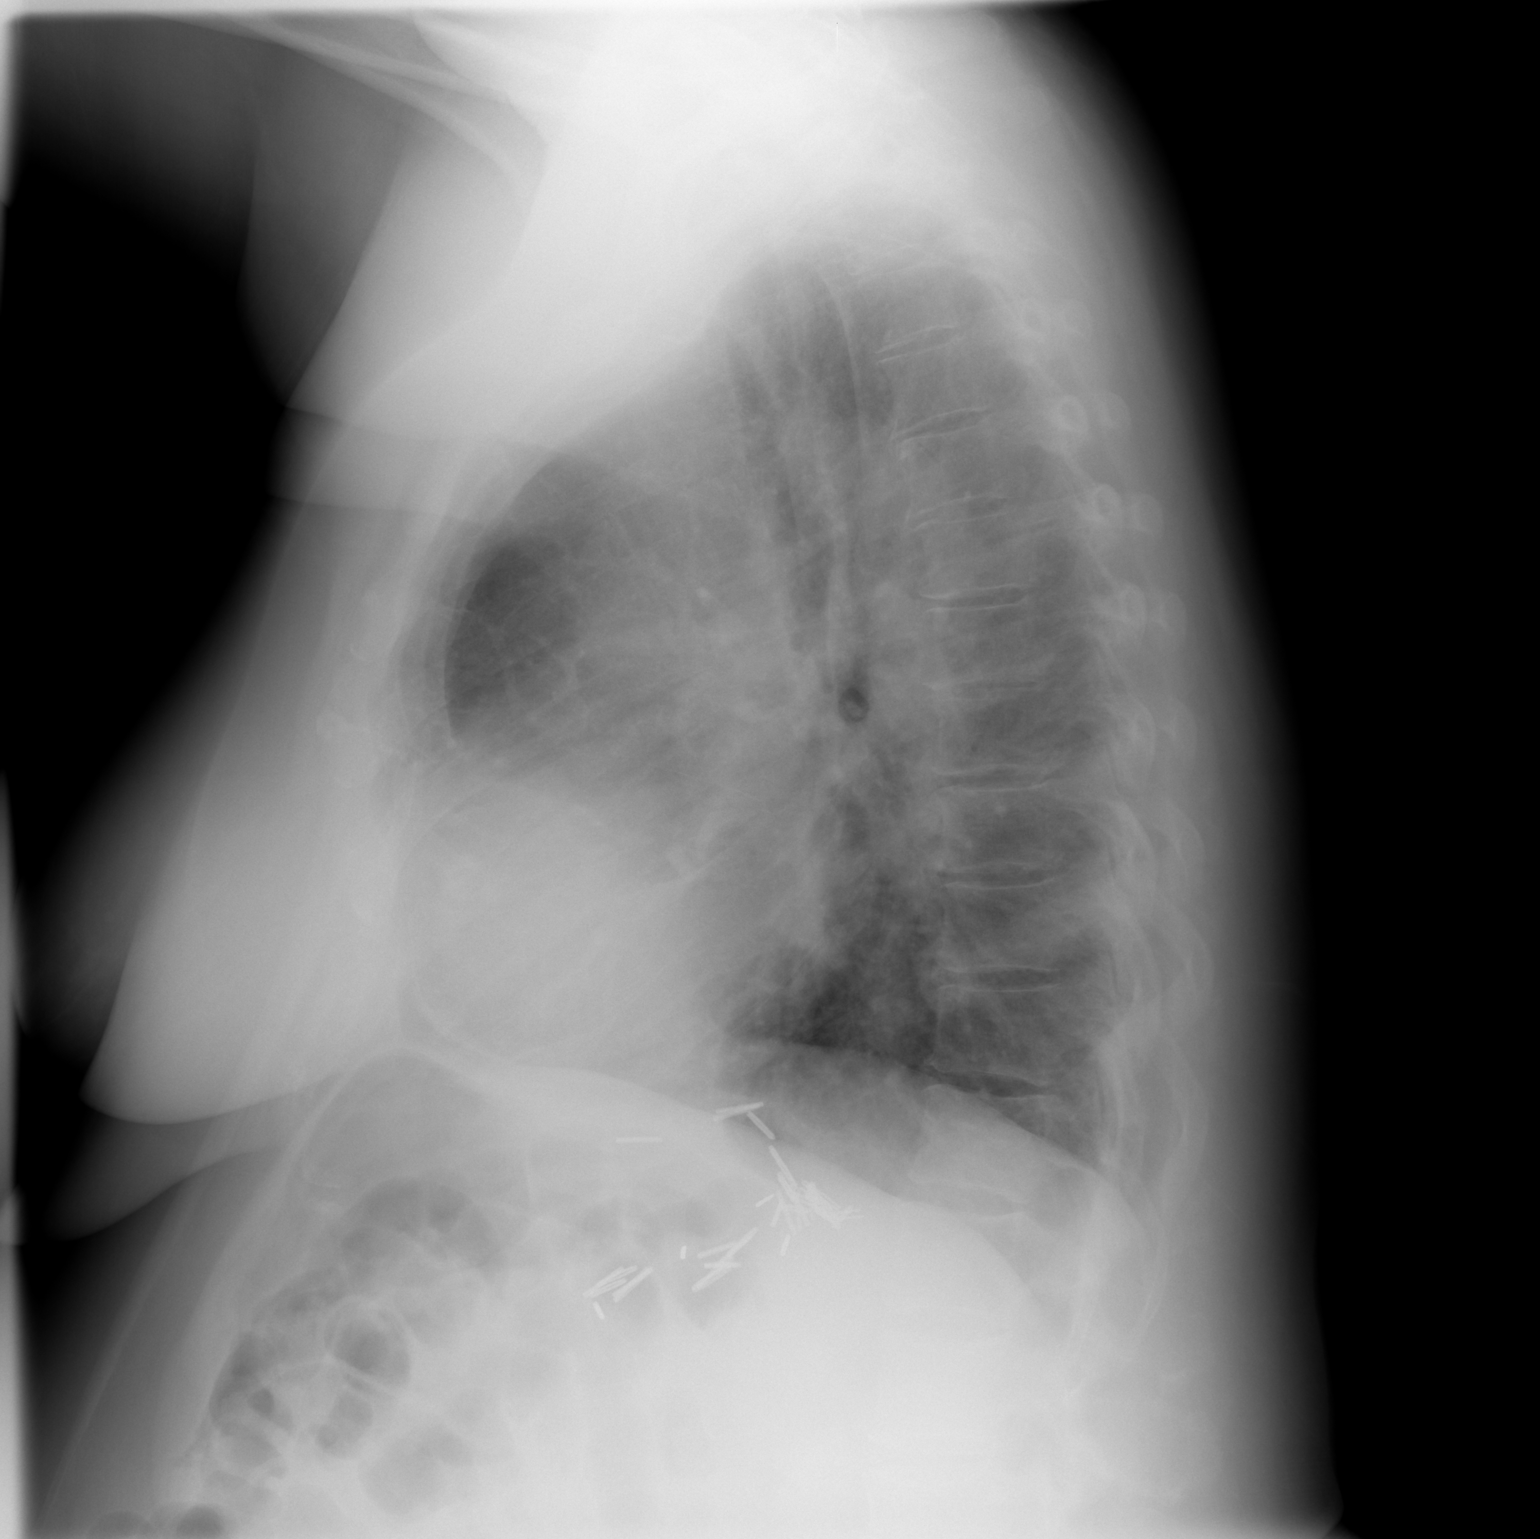

[2 of 2 positions shown; findings below may reference images not displayed]

FINDINGS: There is chronic cardiomegaly.  The pulmonary vascularity is at the upper limits of normal. There are no discrete consolidated infiltrates or effusions.  No acute bony abnormality.
IMPRESSION: No significant change since the prior study.  Chronic cardiomegaly.

## 2009-06-10 ENCOUNTER — Encounter: Payer: Self-pay | Admitting: Internal Medicine

## 2009-08-25 ENCOUNTER — Telehealth: Payer: Self-pay | Admitting: Cardiology

## 2009-08-26 ENCOUNTER — Telehealth (INDEPENDENT_AMBULATORY_CARE_PROVIDER_SITE_OTHER): Payer: Self-pay | Admitting: *Deleted

## 2009-09-03 ENCOUNTER — Telehealth (INDEPENDENT_AMBULATORY_CARE_PROVIDER_SITE_OTHER): Payer: Self-pay | Admitting: *Deleted

## 2009-09-30 ENCOUNTER — Emergency Department (HOSPITAL_COMMUNITY): Admission: EM | Admit: 2009-09-30 | Discharge: 2009-09-30 | Payer: Self-pay | Admitting: Emergency Medicine

## 2009-11-09 ENCOUNTER — Encounter: Payer: Self-pay | Admitting: Internal Medicine

## 2009-11-16 ENCOUNTER — Emergency Department (HOSPITAL_COMMUNITY): Admission: EM | Admit: 2009-11-16 | Discharge: 2009-11-16 | Payer: Self-pay | Admitting: Emergency Medicine

## 2009-12-16 ENCOUNTER — Telehealth (INDEPENDENT_AMBULATORY_CARE_PROVIDER_SITE_OTHER): Payer: Self-pay | Admitting: *Deleted

## 2010-01-10 IMAGING — CR DG CHEST 2V
2 series · 2 of 2 positions shown · non-contrast
Comparison: none

CLINICAL DATA: Short of breath

Chest 2 view:
Comparison 11/01/2006. There has been development of perihilar and bibasilar
interstitial edema or infiltrates. No definite effusion. Mild cardiomegaly.
Multiple vascular clips project at the GE junction. Visualized bones
unremarkable.

[w chest pa]
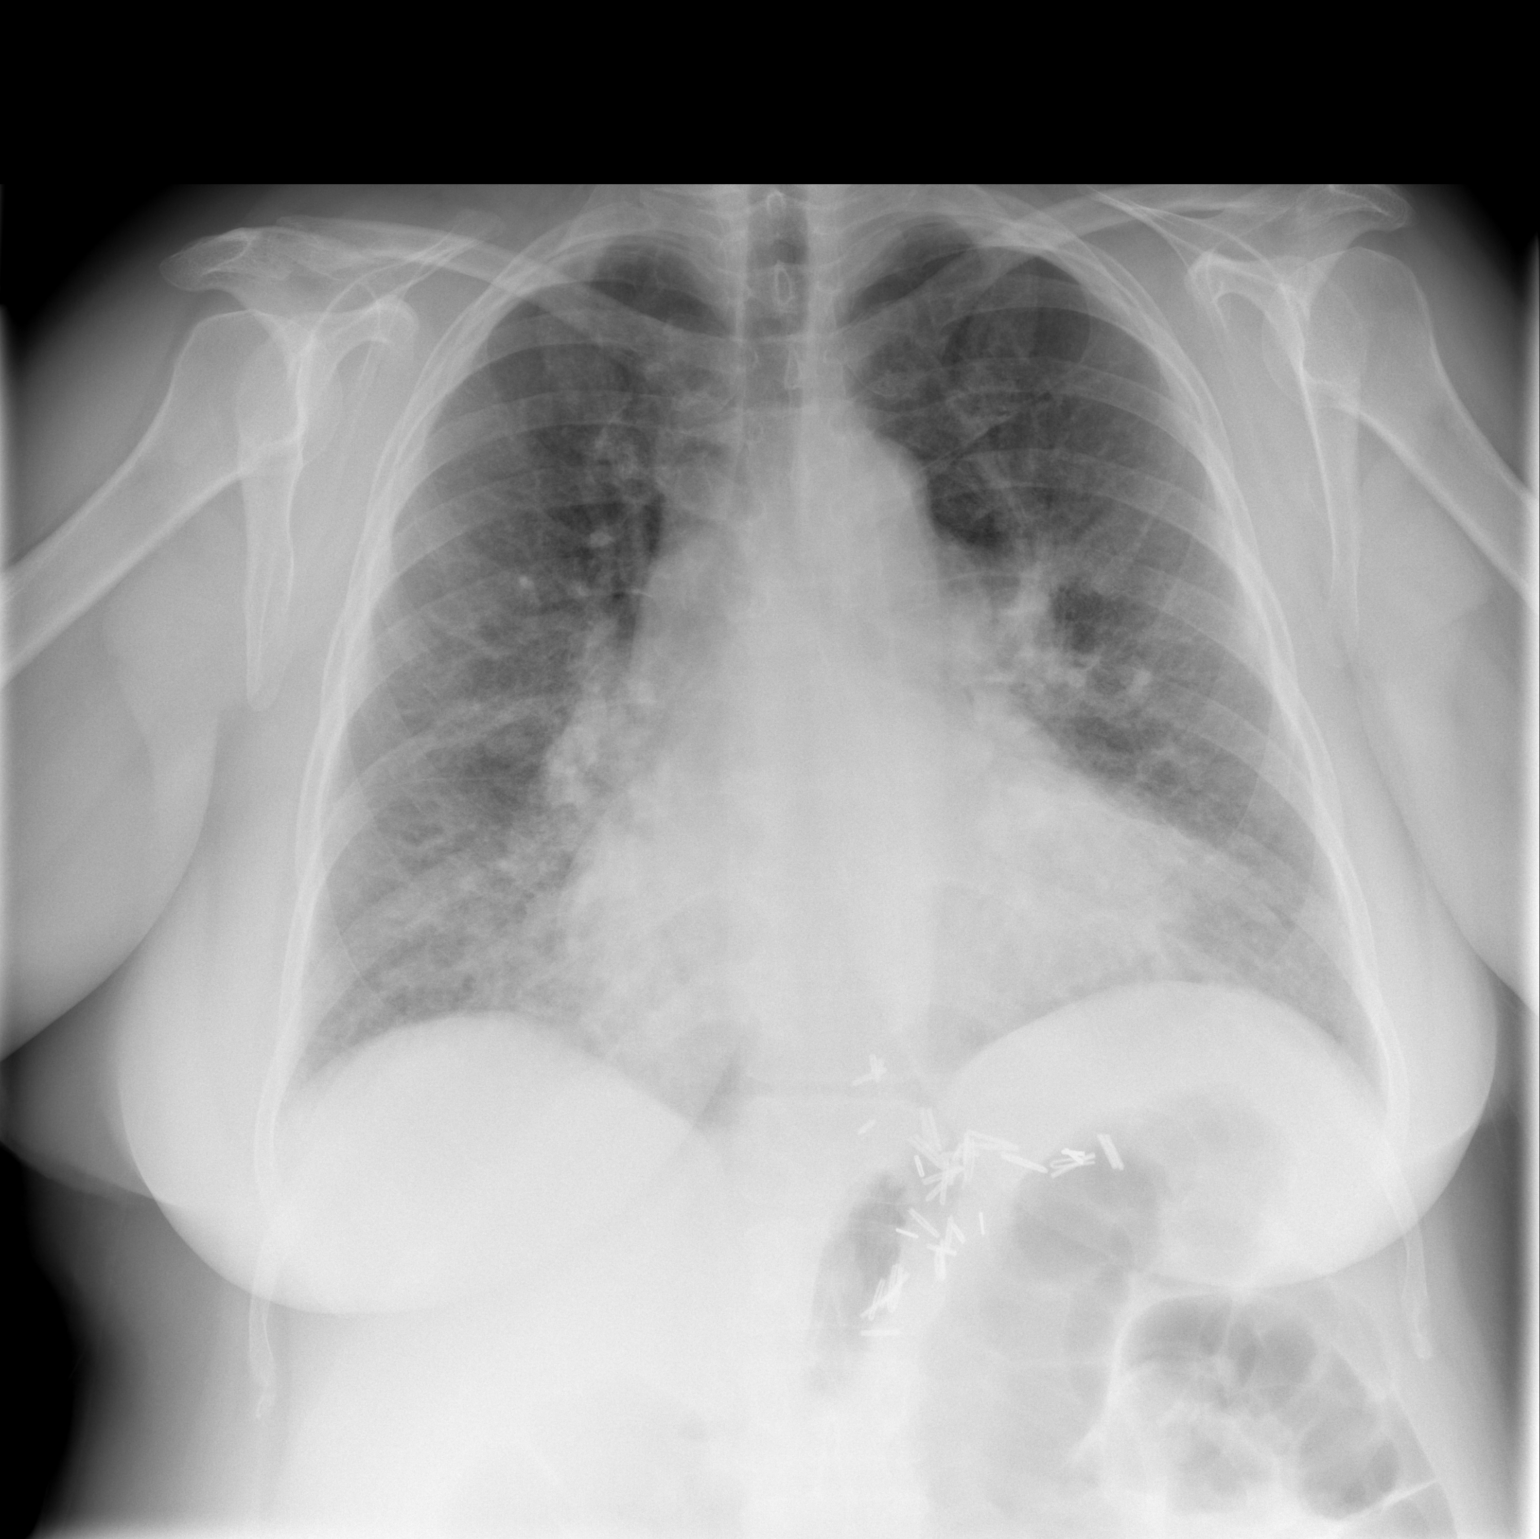

[w chest lat]
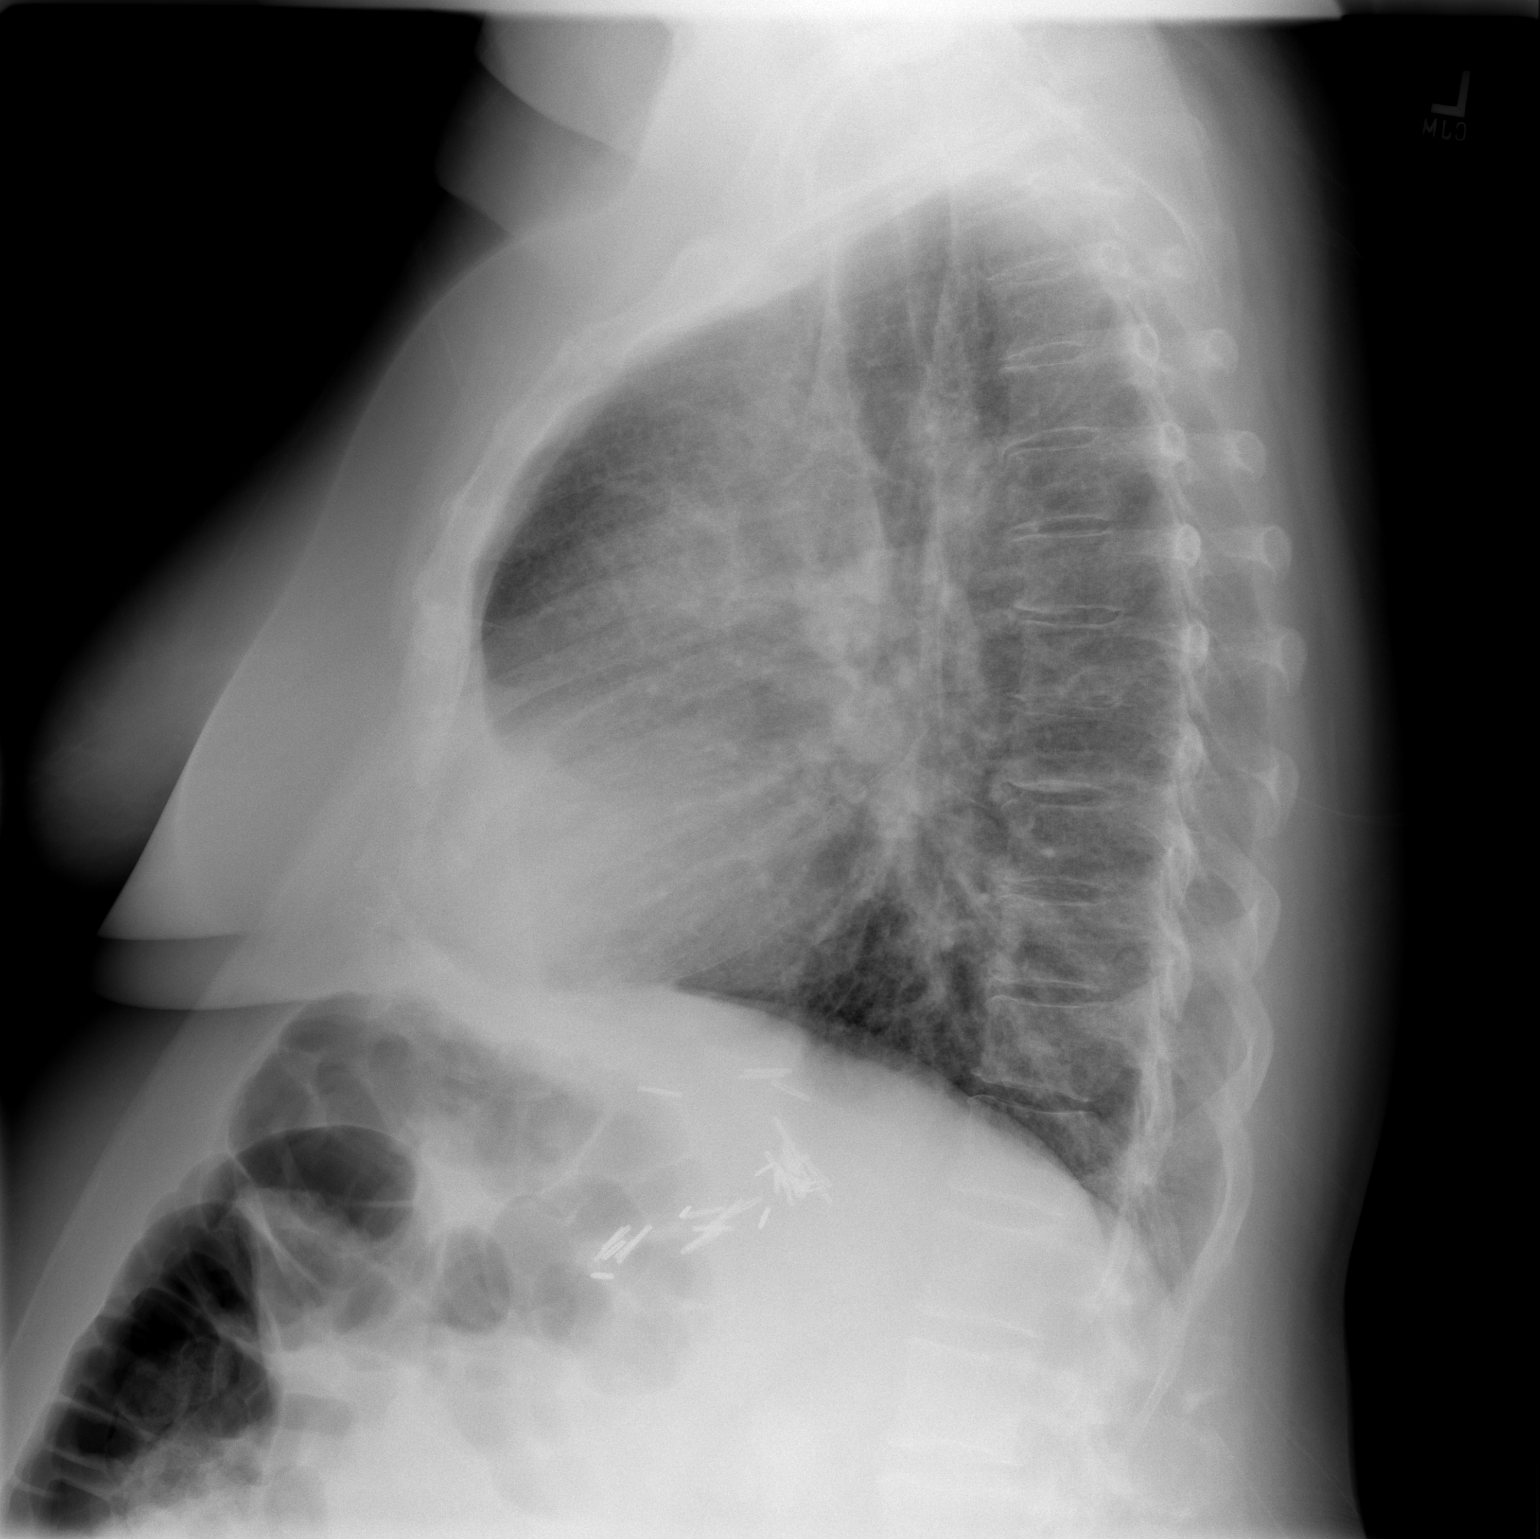

[2 of 2 positions shown; findings below may reference images not displayed]

IMPRESSION: 1. Stable  cardiomegaly with worsening bilateral edema or infiltrates

## 2010-01-11 IMAGING — CT CT CHEST W/O CM
3 series · 18 of 30 positions shown, 19 images · IV contrast (agent unspecified)
Comparison: CT chest with contrast dated 10/14/04.

CLINICAL DATA: Cough, 
CHEST CT WITHOUT CONTRAST:
TECHNIQUE: Multidetector CT imaging of the chest was performed following the standard protocol without IV contrast.

[Series 2: routine chest · axial · 0.70mm/px · z∈[-211,-71]mm · 4 of 57 slices shown, 5 images]
[im 15/57  mediastinal]
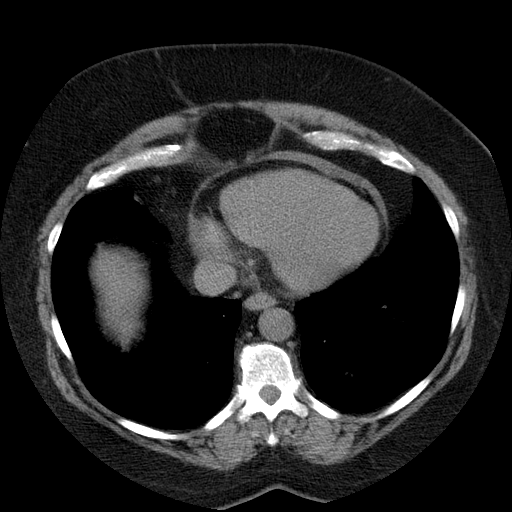
[im 15/57  lung]
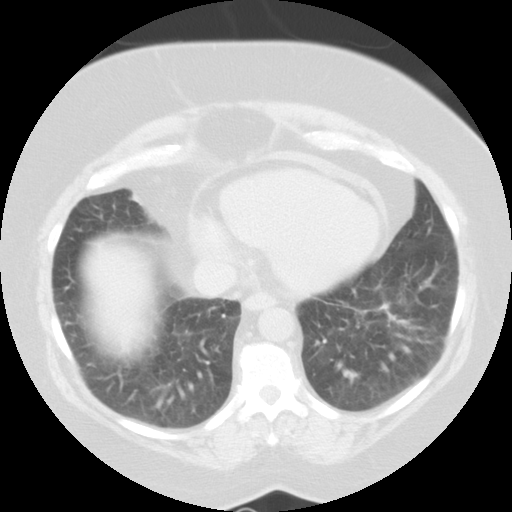
[im 29/57  lung]
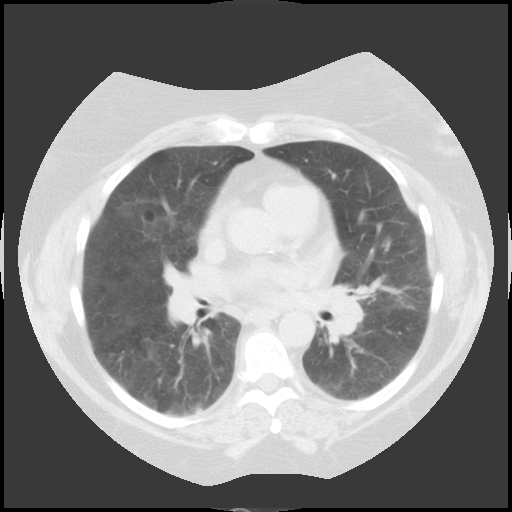
[im 31/57  lung]
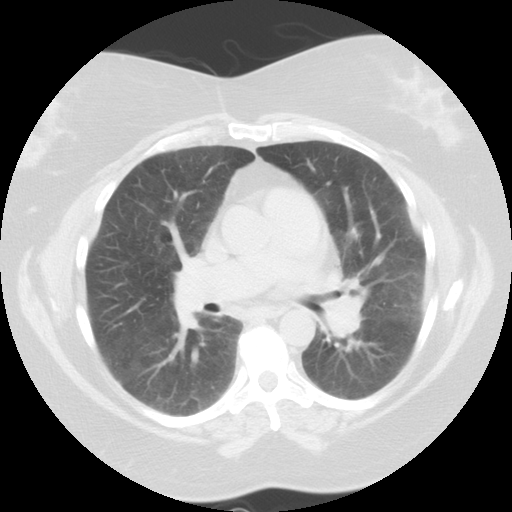
[im 43/57  lung]
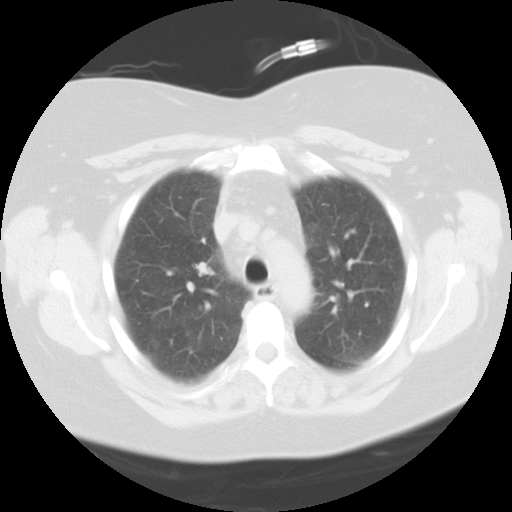

[Series 400: reformatted · coronal · 0.68mm/px · 6 of 125 slices shown (1 of 2)]
[im 13/125  lung]
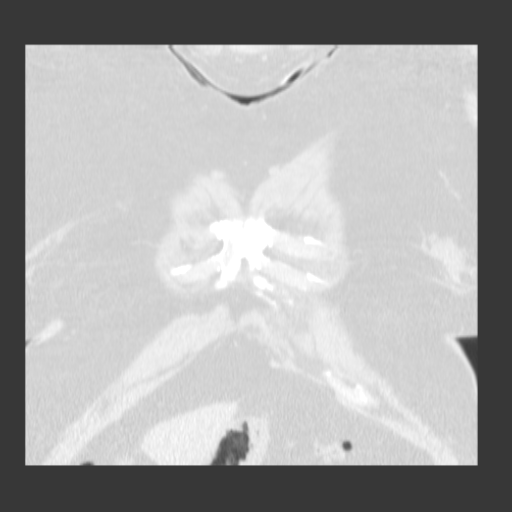
[im 25/125  lung]
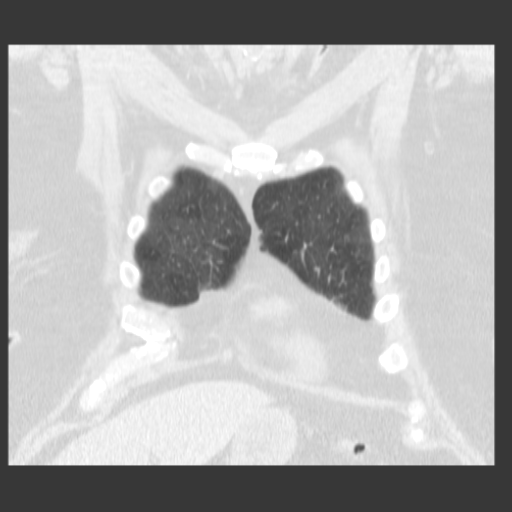
[im 38/125  lung]
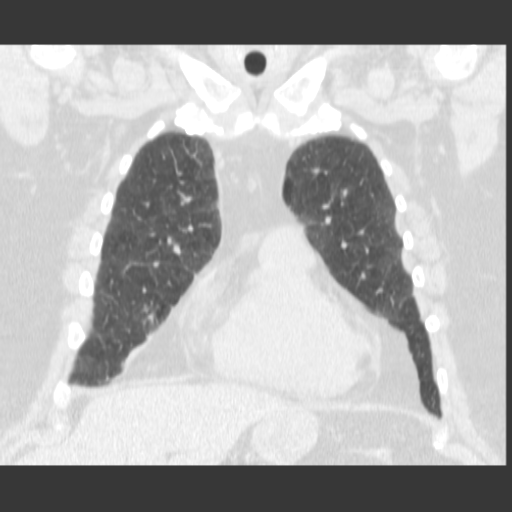
[im 50/125  lung]
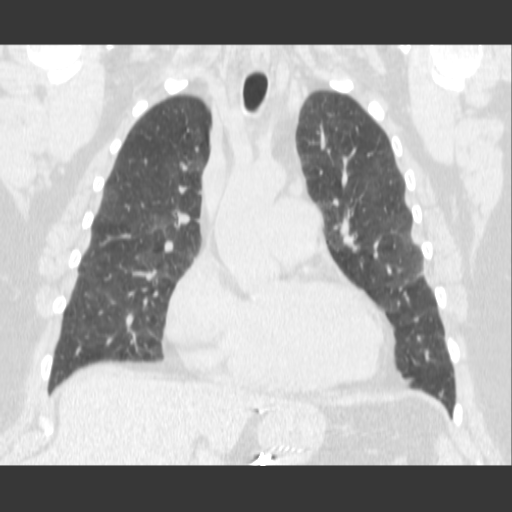
[im 75/125  lung]
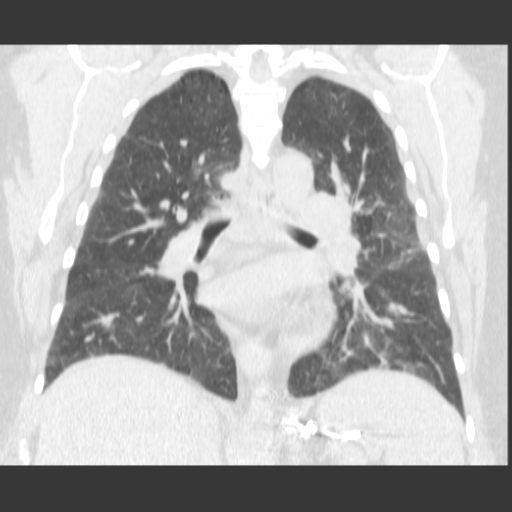
[im 87/125  lung]
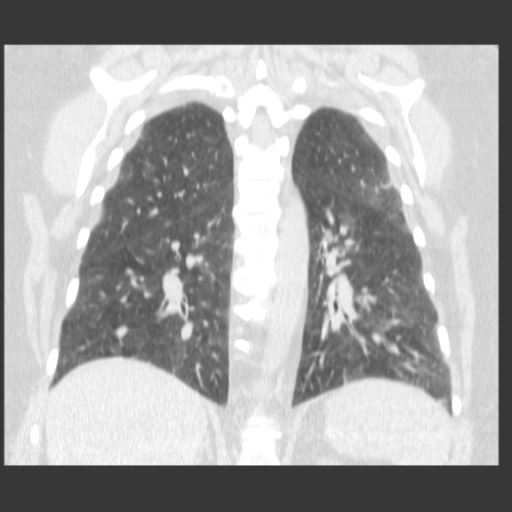

[Series 401: reformatted · sagittal · 0.68mm/px · 8 of 139 slices shown (2 of 2)]
[im 13/139  lung]
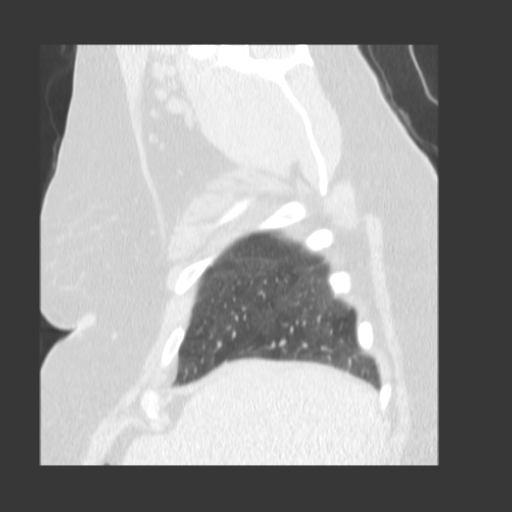
[im 38/139  lung]
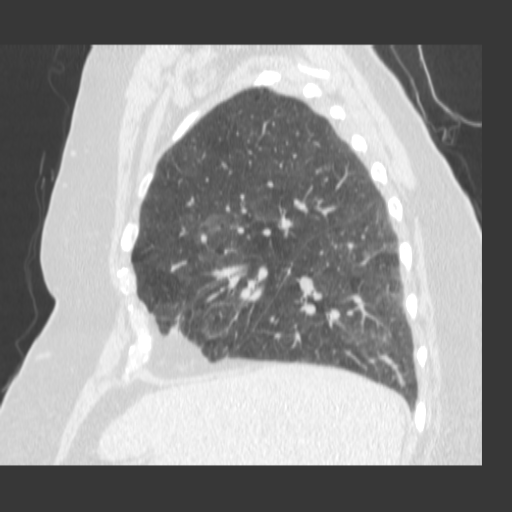
[im 51/139  lung]
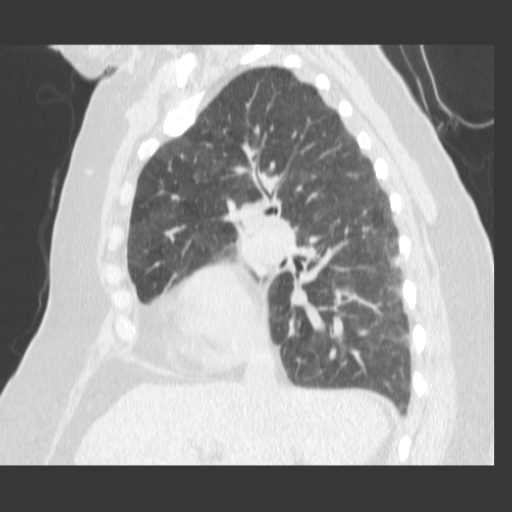
[im 63/139  lung]
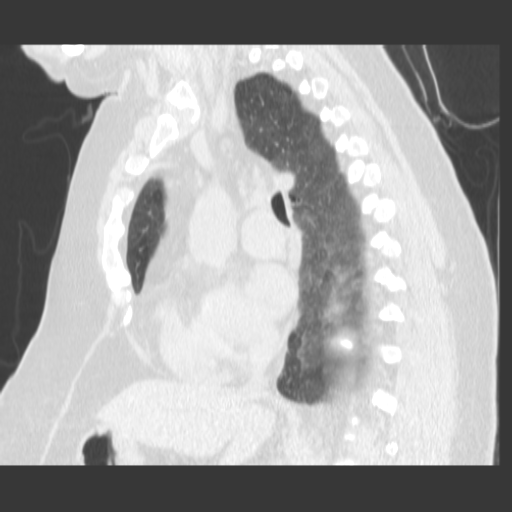
[im 76/139  lung]
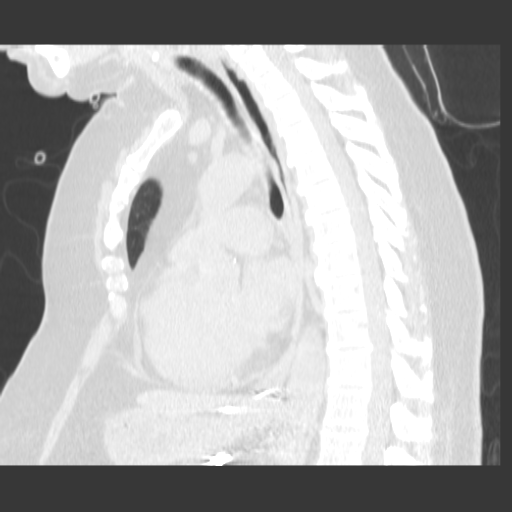
[im 88/139  lung]
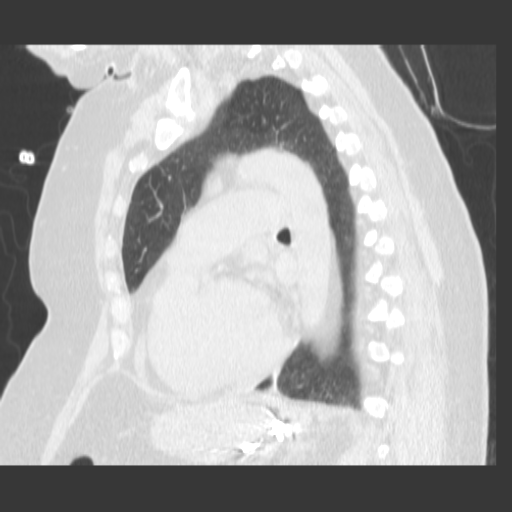
[im 101/139  lung]
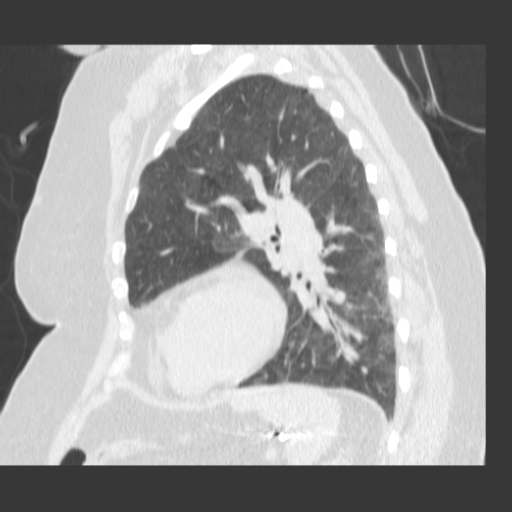
[im 126/139  lung]
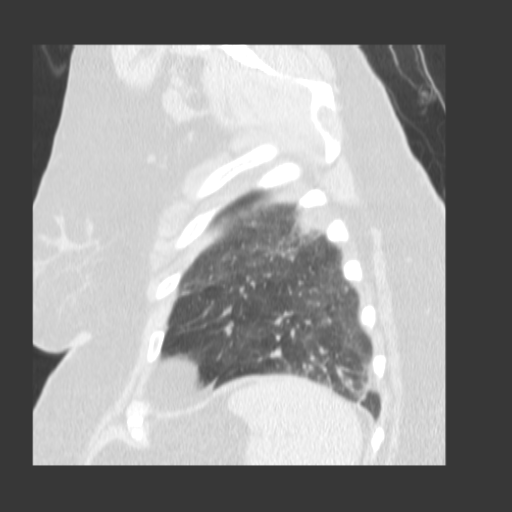

[18 of 30 positions shown; findings below may reference images not displayed]

FINDINGS: Review of lung parenchyma demonstrates ground-glass opacities diffusely in the lungs with lower lobe predominance.  No evidence of pleural effusions.  A small 3 mm pulmonary nodule in the right middle lobe (image 35) is unchanged from prior.  No evidence of consolidation.  No pneumothorax.  
The airway appears patent.  Review of the mediastinum demonstrates small peritracheal lymph nodes measuring 12 mm in the right lower paratracheal position on image 22, unchanged from 12 mm on prior.  There is a 10 mm prevascular node which compares to 9 mm on prior.  No new lymphadenopathy.  No evidence of supraclavicular or axillary lymphadenopathy. 
Limited view of the upper abdomen demonstrates surgical clips in the region of the gastric cardia.  Otherwise unremarkable.   There is a small pericardial effusion which is new from prior.  
Review of bone windows demonstrates no aggressive osseous lesion.
IMPRESSION: 1.  Ground-glass opacity diffusely in the lungs with lower lobe predominance, differential includes pulmonary edema and infection.  Favor pulmonary edema.  
2.  Mild mediastinal lymphadenopathy is unchanged from 10/14/04. 
3.  A small 3 mm pulmonary nodule is unchanged since 10/14/04 suggesting benign etiology. 
4.  New small pericardial effusion.

## 2010-01-14 IMAGING — CR DG CHEST 1V PORT
1 series · 1 of 1 positions shown · non-contrast
Comparison: 06/06/07.

CLINICAL DATA: Hypoxia.  Follow up congestive heart failure.  Pneumonia. 
 PORTABLE CHEST - 1 VIEW:

[view not recorded]
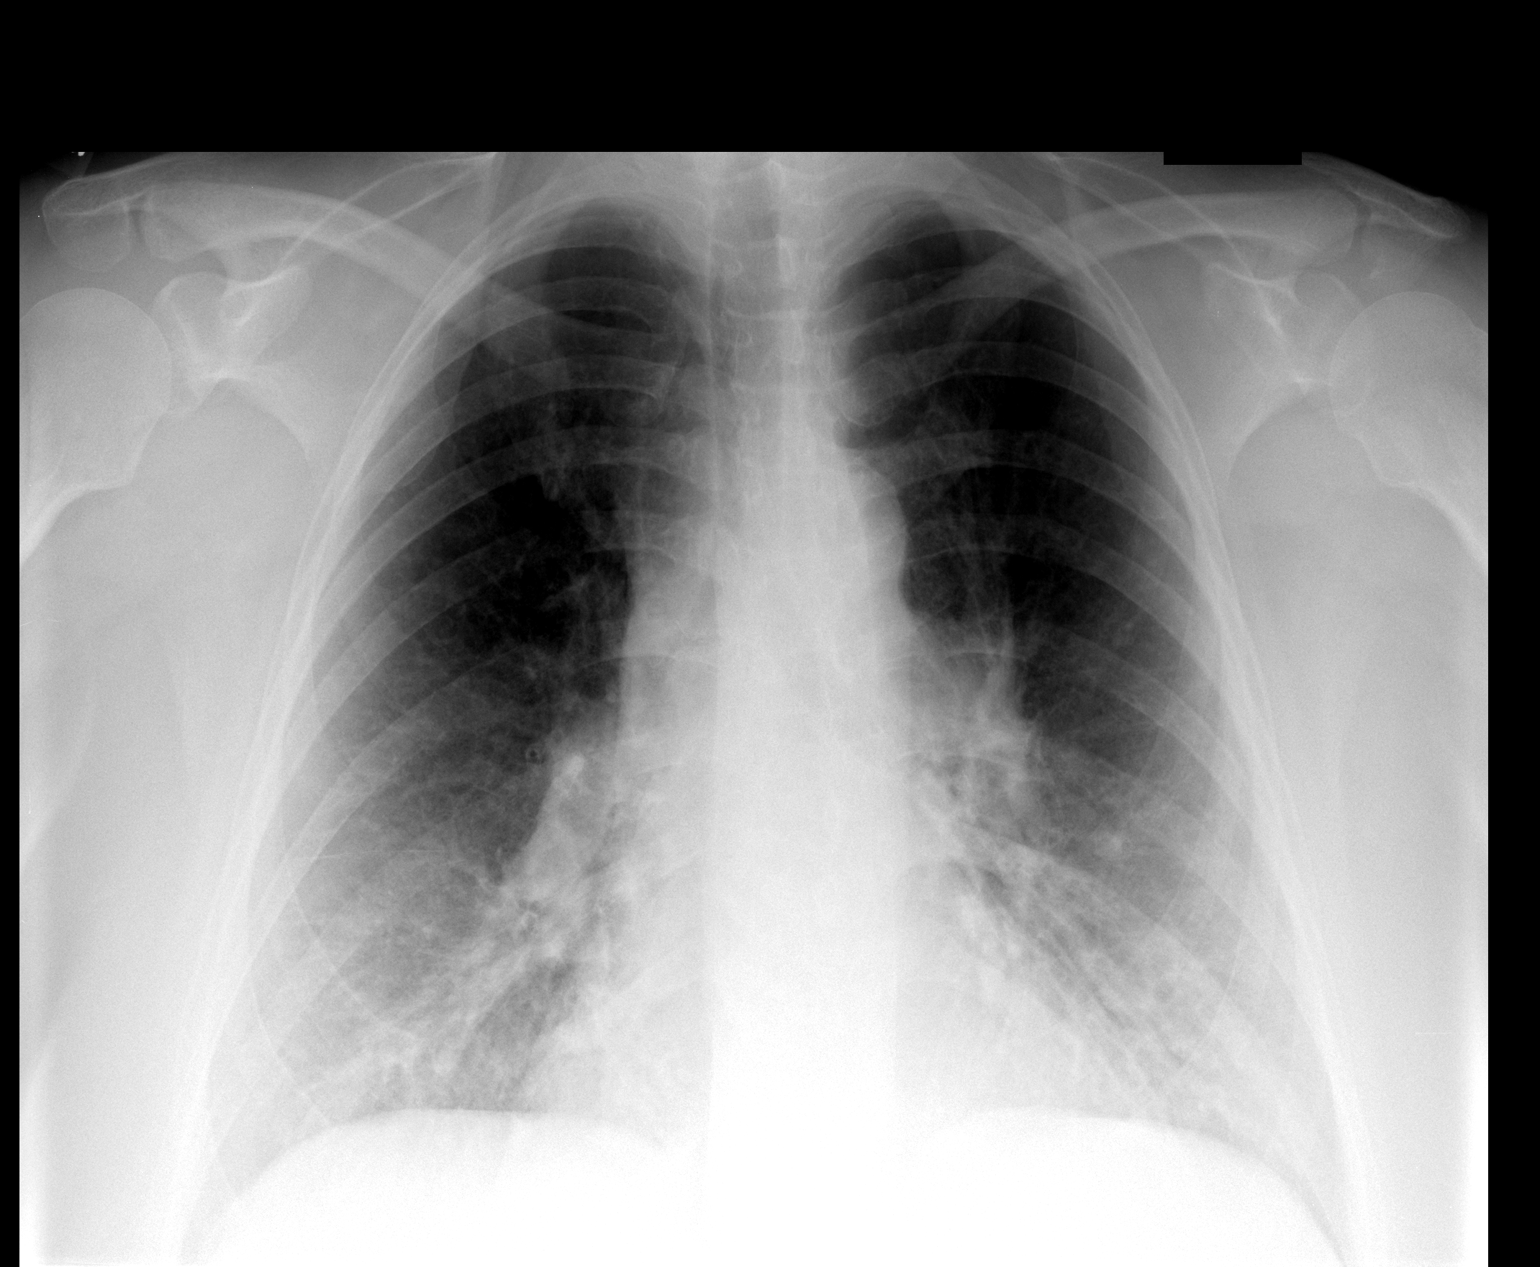

[1 of 1 positions shown; findings below may reference images not displayed]

FINDINGS: Decreased pulmonary edema seen since prior study.  There is some persistent edema mainly in the lung bases which shows improvement.  Cardiomegaly is again noted.
IMPRESSION: Resolving pulmonary edema since prior study.

## 2010-01-16 IMAGING — CR DG CHEST 1V PORT
1 series · 1 of 1 positions shown · non-contrast
Comparison: 06/10/2006

CLINICAL DATA: 52-year-old female, pneumonia. Hypoxia. 
 PORTABLE CHEST - 1 VIEW:

[view not recorded]
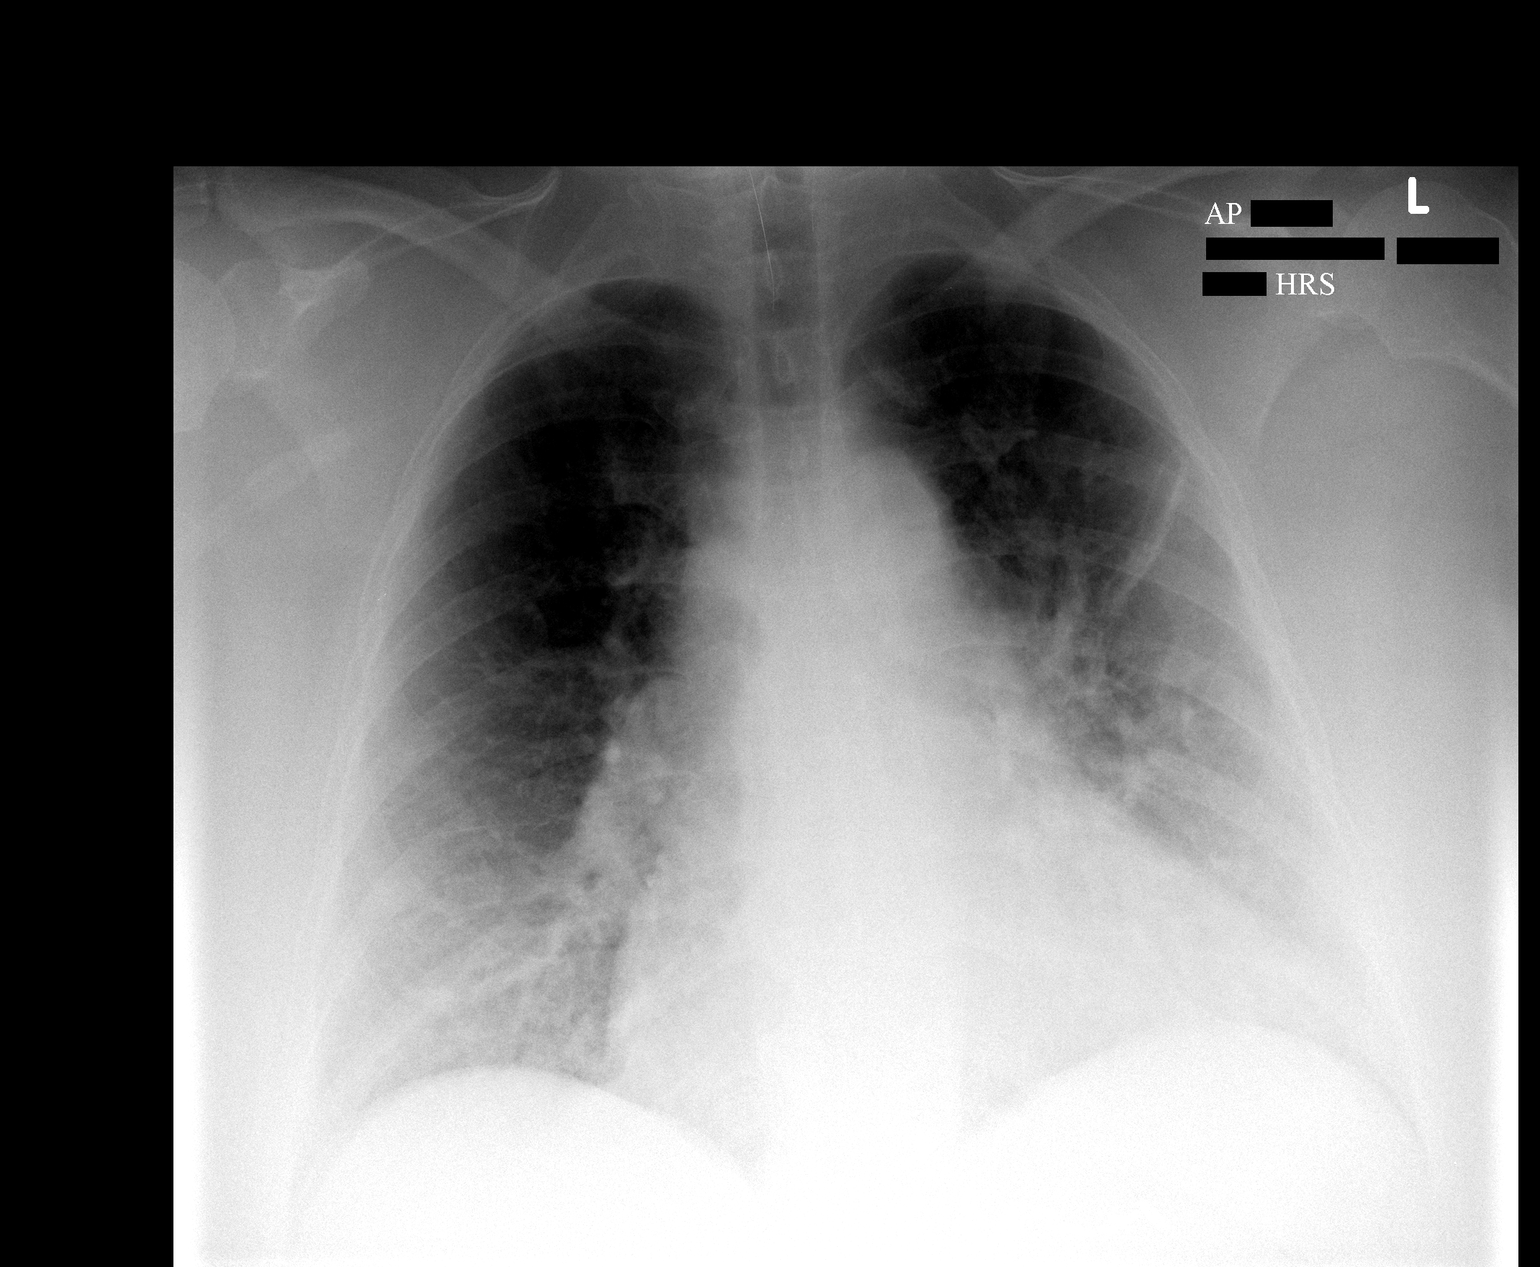

[1 of 1 positions shown; findings below may reference images not displayed]

FINDINGS: The cardiopericardial silhouette remains mildly enlarged. Lung volumes are low.  Moderate edema has increased somewhat over the past 2 days.  An area of subsegmental atelectasis is present in the left upper lobe.
IMPRESSION: 1.  Cardiomegaly and increasing edema compatible with congestive heart failure. 
 2.  Subsegmental atelectasis left upper lobe.

## 2010-01-19 ENCOUNTER — Encounter: Payer: Self-pay | Admitting: Internal Medicine

## 2010-01-20 ENCOUNTER — Emergency Department (HOSPITAL_COMMUNITY): Admission: EM | Admit: 2010-01-20 | Discharge: 2010-01-20 | Payer: Self-pay | Admitting: Emergency Medicine

## 2010-01-20 ENCOUNTER — Telehealth: Payer: Self-pay | Admitting: Internal Medicine

## 2010-01-22 ENCOUNTER — Telehealth (INDEPENDENT_AMBULATORY_CARE_PROVIDER_SITE_OTHER): Payer: Self-pay | Admitting: *Deleted

## 2010-03-11 ENCOUNTER — Emergency Department (HOSPITAL_COMMUNITY)
Admission: EM | Admit: 2010-03-11 | Discharge: 2010-03-11 | Payer: Self-pay | Source: Home / Self Care | Admitting: Emergency Medicine

## 2010-03-17 IMAGING — CR DG HAND COMPLETE 3+V*R*
3 series · 3 of 3 positions shown · non-contrast
Comparison: None available

CLINICAL DATA: Finger lesion

RIGHT HAND - COMPLETE 3+ VIEW

[x hand pa right]
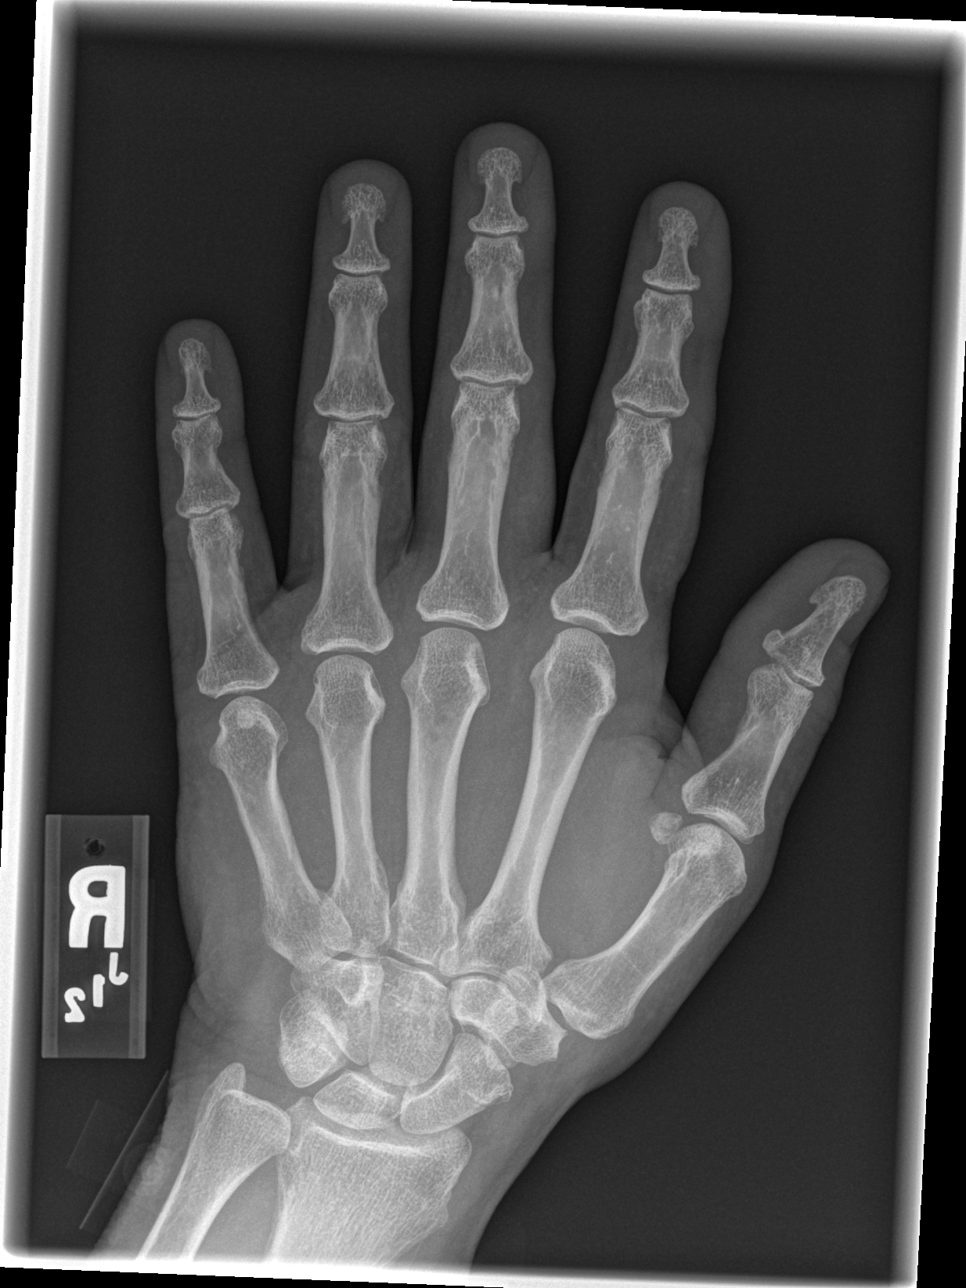

[x hand oblique right]
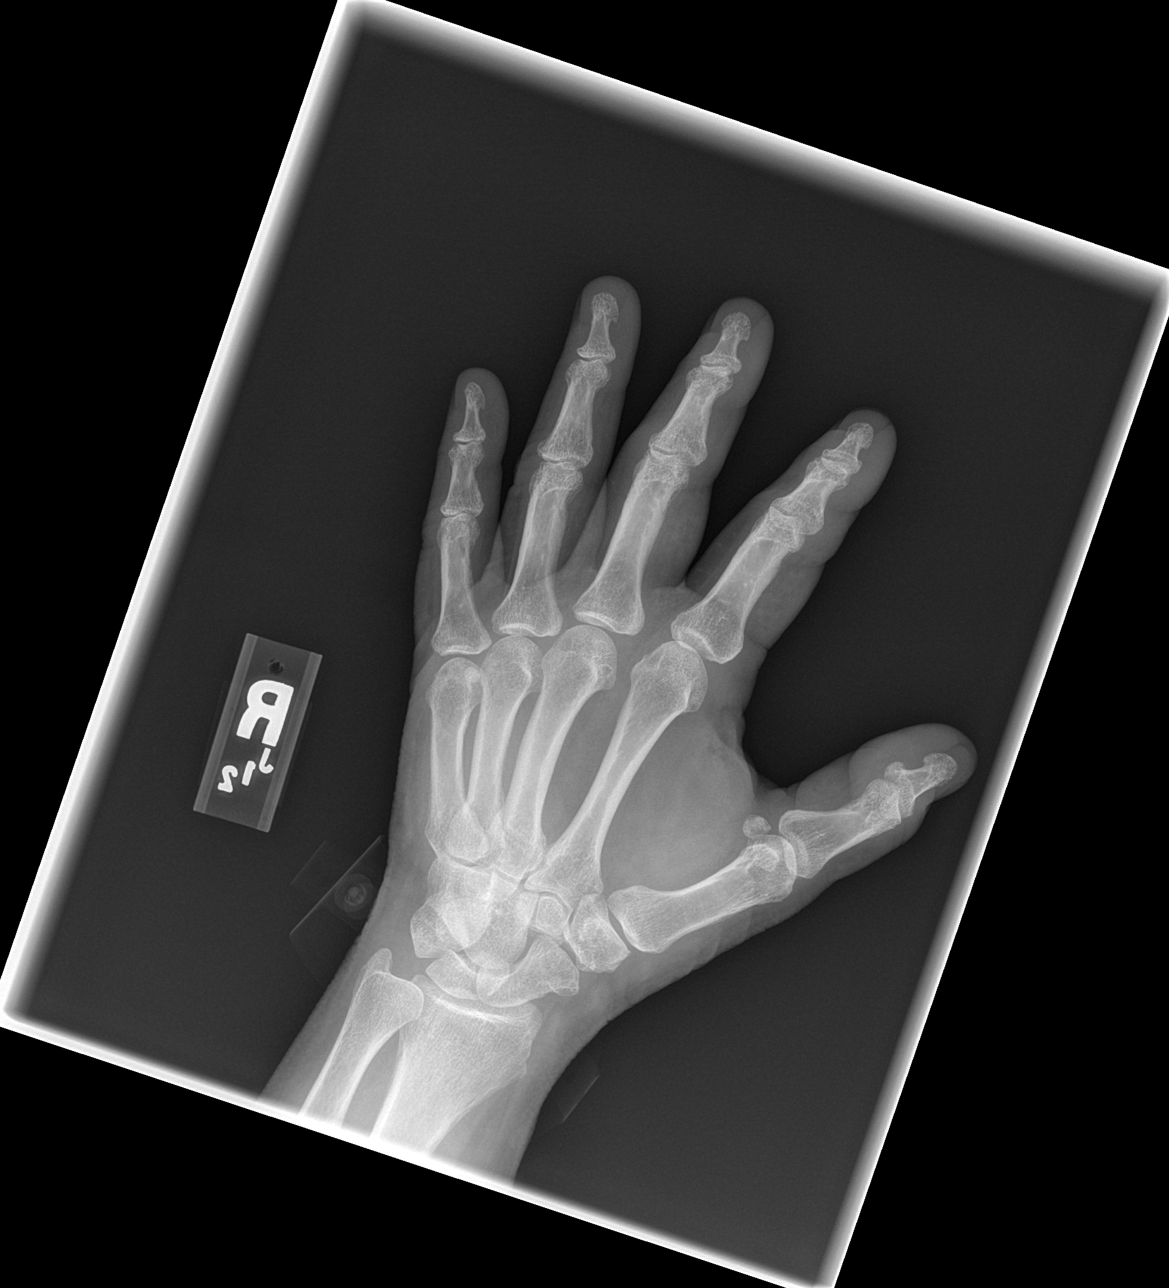

[x hand lat right]
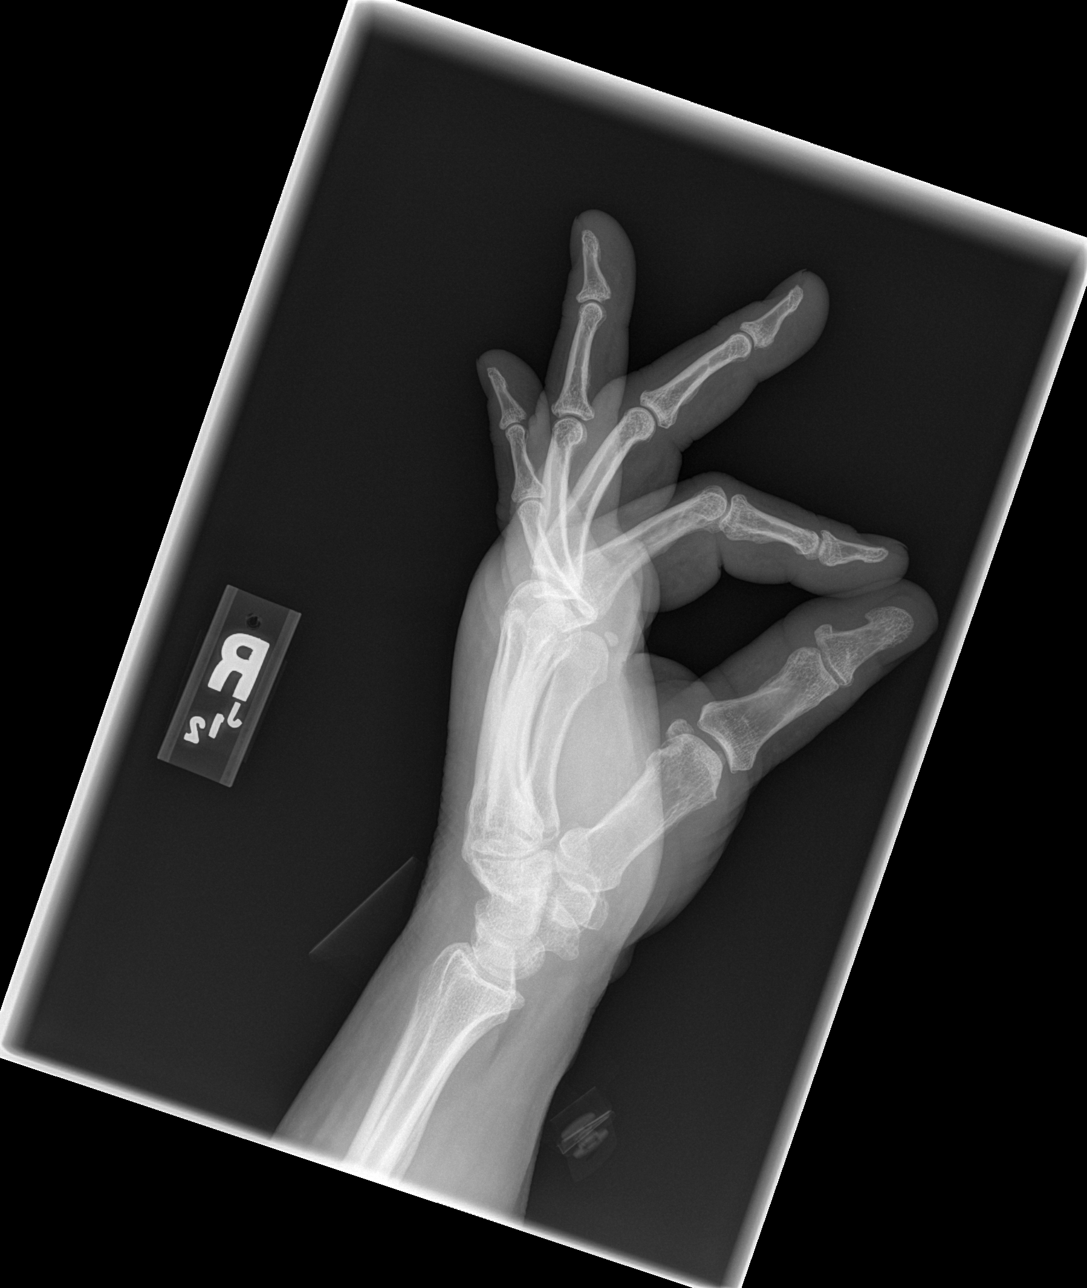

[3 of 3 positions shown; findings below may reference images not displayed]

FINDINGS: No radiodense foreign body or subcutaneous gas. Negative
for fracture, dislocation, or other acute abnormality.  Normal
alignment and mineralization. No significant degenerative change.
Regional soft tissues unremarkable.
IMPRESSION: Negative

## 2010-04-25 ENCOUNTER — Encounter: Payer: Self-pay | Admitting: Internal Medicine

## 2010-05-04 NOTE — Progress Notes (Signed)
Summary: plavix from [facility]  Phone Note Outgoing Call   Call placed by: Sim Boast, RN,  December 16, 2009 11:25 AM Call placed to: Patient Details for Reason: Plavix Summary of Call: called pt to let her know her Plavix 75 mg has arrived and will be at the front desk.  she states understanding.  lot number [id number]  EXP  JUL 2014 Initial call taken by: Sim Boast, RN,  December 16, 2009 11:27 AM

## 2010-05-04 NOTE — Progress Notes (Signed)
  Request from [facility] forwarded to [facility] for processing. [name]  May 04, 2009 8:50 AM    Appended Document:  2nd Request from [facility] forwarded to [facility] for processing

## 2010-05-04 NOTE — Miscellaneous (Signed)
Summary: [facility]: Verbal Orders  [facility]: Verbal Orders   Imported By: Bonner Puna 06/12/2009 14:10:30  _____________________________________________________________________  External Attachment:    Type:   Image     Comment:   External Document

## 2010-05-04 NOTE — Letter (Signed)
Summary: [place]   Imported By: Marilynne Drivers 04/18/2009 10:21:52  _____________________________________________________________________  External Attachment:    Type:   Image     Comment:   External Document

## 2010-05-04 NOTE — Miscellaneous (Signed)
Summary: [facility]: Verbal Order  [facility]: Verbal Order   Imported By: Bonner Puna 05/08/2009 11:44:45  _____________________________________________________________________  External Attachment:    Type:   Image     Comment:   External Document

## 2010-05-04 NOTE — Progress Notes (Signed)
  Phone Note Other Incoming   Details for Reason: run out meds for COPD Summary of Call: Got call for ED for medicine assistance. She went to ED for SOB due to COPD exacerbation on 01/20/2010 and has run out of COPD meds for months, can not afford meds including advair and albuterol. Needs medicine assistance for advair and albuterol and asked her to go to [facility] for the help such as finance or samples.  Initial call taken by: Geanie Kenning MD,  January 20, 2010 10:26 PM

## 2010-05-04 NOTE — Miscellaneous (Signed)
Summary: [place] CERTIFICATION   Imported By: Enedina Finner 01/21/2010 14:39:00  _____________________________________________________________________  External Attachment:    Type:   Image     Comment:   External Document

## 2010-05-04 NOTE — Miscellaneous (Signed)
Summary: [facility]: Orders  [facility]: Orders   Imported By: Bonner Puna 04/09/2009 13:51:15  _____________________________________________________________________  External Attachment:    Type:   Image     Comment:   External Document

## 2010-05-04 NOTE — Miscellaneous (Signed)
Summary: [facility]: Orders  [facility]: Orders   Imported By: Bonner Puna 04/13/2009 14:36:48  _____________________________________________________________________  External Attachment:    Type:   Image     Comment:   External Document

## 2010-05-04 NOTE — Letter (Signed)
Summary: [facility]   Imported By: Jamelle Haring 04/20/2009 08:27:01  _____________________________________________________________________  External Attachment:    Type:   Image     Comment:   External Document

## 2010-05-04 NOTE — Progress Notes (Signed)
Summary: returning call  Phone Note Call from Patient Call back at Home Phone [phone]   Caller: Patient Reason for Call: Talk to Nurse Summary of Call: returning call Initial call taken by: Darnell Level,  Aug 26, 2009 3:44 PM  Follow-up for Phone Call        order processed and should be in within 5 to 7 business days.  Pt aware that we will call her when it arrives. Follow-up by: Sim Boast, RN,  Aug 26, 2009 4:54 PM

## 2010-05-04 NOTE — Progress Notes (Signed)
Summary: Need Plavix ordered from [facility]  Phone Note Call from Patient Message from:  Patient on Aug 25, 2009 4:09 PM  Refills Requested: Medication #1:  PLAVIX 75 MG TABS Take 1 tablet by mouth once a day Caller: Patient Summary of Call: Pt calling  about her medication Plavix and getting it ordered from [facility] [phone] Initial call taken by: Delsa Sale,  Aug 25, 2009 4:11 PM  Follow-up for Phone Call        According to [facility] pt's paperwork needs to be redone.  forms started and mailed to pt for her to complete and return to [place] ASAP.  She is to include proof of income. lm for pt on voicemail of above. Follow-up by: Sim Boast, RN,  Aug 26, 2009 3:35 PM

## 2010-05-04 NOTE — Progress Notes (Signed)
Summary: samples request/gp  Phone Note Call from Patient   Caller: Patient Summary of Call: Pt. called and requested samples on her inhalers; pt. is taking Advair and Albuterol.  See Dr. Gwendolyn Fill note.  Stated she will talk to [facility] when she picks up the samples since she loss her insurance when she loss her job. Initial call taken by: Morrison Old RN,  January 22, 2010 12:22 PM  Follow-up for Phone Call        As long as we have samples and she is working with [facility] I am ok with giving her sample of each. Thanks Follow-up by: Larey Dresser MD,  January 22, 2010 1:52 PM  Additional Follow-up for Phone Call Additional follow up Details #1::        Pt. was called; instructed to talk to the Financial Counselor when she picks up the samples.  Additional Follow-up by: Morrison Old RN,  January 22, 2010 2:21 PM

## 2010-05-04 NOTE — Progress Notes (Signed)
Summary: plavix from [facility]  Phone Note Outgoing Call   Call placed by: Sim Boast, RN,  September 03, 2009 5:06 PM Call placed to: Patient Details for Reason: plavix samples Summary of Call: pt aware samples from [facility] here for her to pick up.  She also needs to schedule an appt. Initial call taken by: Sim Boast, RN,  September 03, 2009 5:06 PM

## 2010-05-04 NOTE — Miscellaneous (Signed)
Summary: [place] CERTIFICATION   Imported By: Enedina Finner 11/09/2009 16:38:40  _____________________________________________________________________  External Attachment:    Type:   Image     Comment:   External Document

## 2010-05-04 NOTE — Miscellaneous (Signed)
Summary: [facility] [place] By: Bonner Puna 04/09/2009 13:52:15  _____________________________________________________________________  External Attachment:    Type:   Image     Comment:   External Document

## 2010-05-07 NOTE — Miscellaneous (Signed)
Summary: [facility]: Orders  [facility]: Orders   Imported By: Bonner Puna 04/09/2009 12:34:10  _____________________________________________________________________  External Attachment:    Type:   Image     Comment:   External Document

## 2010-05-07 NOTE — Miscellaneous (Signed)
Summary: [facility]: Orders  [facility]: Orders   Imported By: Bonner Puna 05/08/2009 16:44:39  _____________________________________________________________________  External Attachment:    Type:   Image     Comment:   External Document

## 2010-06-01 ENCOUNTER — Emergency Department (HOSPITAL_COMMUNITY): Payer: Self-pay

## 2010-06-01 ENCOUNTER — Observation Stay (HOSPITAL_COMMUNITY)
Admission: EM | Admit: 2010-06-01 | Discharge: 2010-06-02 | Disposition: A | Discharge status: Home / Self Care | Payer: Self-pay | Attending: Internal Medicine | Admitting: Internal Medicine

## 2010-06-01 DIAGNOSIS — K219 Gastro-esophageal reflux disease without esophagitis: Secondary | ICD-10-CM | POA: Insufficient documentation

## 2010-06-01 DIAGNOSIS — I519 Heart disease, unspecified: Secondary | ICD-10-CM | POA: Insufficient documentation

## 2010-06-01 DIAGNOSIS — I2510 Atherosclerotic heart disease of native coronary artery without angina pectoris: Secondary | ICD-10-CM | POA: Insufficient documentation

## 2010-06-01 DIAGNOSIS — E669 Obesity, unspecified: Secondary | ICD-10-CM | POA: Insufficient documentation

## 2010-06-01 DIAGNOSIS — J4489 Other specified chronic obstructive pulmonary disease: Secondary | ICD-10-CM | POA: Insufficient documentation

## 2010-06-01 DIAGNOSIS — R0789 Other chest pain: Principal | ICD-10-CM | POA: Insufficient documentation

## 2010-06-01 DIAGNOSIS — Z9861 Coronary angioplasty status: Secondary | ICD-10-CM | POA: Insufficient documentation

## 2010-06-01 DIAGNOSIS — R079 Chest pain, unspecified: Secondary | ICD-10-CM

## 2010-06-01 DIAGNOSIS — J449 Chronic obstructive pulmonary disease, unspecified: Secondary | ICD-10-CM | POA: Insufficient documentation

## 2010-06-01 DIAGNOSIS — I2789 Other specified pulmonary heart diseases: Secondary | ICD-10-CM | POA: Insufficient documentation

## 2010-06-01 DIAGNOSIS — R0602 Shortness of breath: Secondary | ICD-10-CM

## 2010-06-01 DIAGNOSIS — F17200 Nicotine dependence, unspecified, uncomplicated: Secondary | ICD-10-CM | POA: Insufficient documentation

## 2010-06-01 DIAGNOSIS — R7309 Other abnormal glucose: Secondary | ICD-10-CM | POA: Insufficient documentation

## 2010-06-01 DIAGNOSIS — G4733 Obstructive sleep apnea (adult) (pediatric): Secondary | ICD-10-CM | POA: Insufficient documentation

## 2010-06-01 LAB — DIFFERENTIAL
Basophils Absolute: 0 10*3/uL (ref 0.0–0.1)
Basophils Relative: 0 % (ref 0–1)
Eosinophils Absolute: 0.3 10*3/uL (ref 0.0–0.7)
Eosinophils Relative: 3 % (ref 0–5)
Lymphs Abs: 1.9 10*3/uL (ref 0.7–4.0)
Neutro Abs: 6 10*3/uL (ref 1.7–7.7)
Neutrophils Relative %: 65 % (ref 43–77)

## 2010-06-01 LAB — COMPREHENSIVE METABOLIC PANEL
ALT: 19 U/L (ref 0–35)
Alkaline Phosphatase: 120 U/L — ABNORMAL HIGH (ref 39–117)
BUN: 10 mg/dL (ref 6–23)
CO2: 32 mEq/L (ref 19–32)
Chloride: 102 mEq/L (ref 96–112)
GFR calc non Af Amer: >60 mL/min (ref >60)
Glucose, Bld: 139 mg/dL — ABNORMAL HIGH (ref 70–99)
Potassium: 3.8 mEq/L (ref 3.5–5.1)
Total Bilirubin: 0.2 mg/dL — ABNORMAL LOW (ref 0.3–1.2)

## 2010-06-01 LAB — BRAIN NATRIURETIC PEPTIDE: Pro B Natriuretic peptide (BNP): <30 pg/mL (ref 0.0–100.0)

## 2010-06-01 LAB — CBC
HCT: 44.7 % (ref 36.0–46.0)
Hemoglobin: 14.3 g/dL (ref 12.0–15.0)
MCV: 92.5 fL (ref 78.0–100.0)
RBC: 4.83 MIL/uL (ref 3.87–5.11)
RDW: 13.1 % (ref 11.5–15.5)
WBC: 9.2 10*3/uL (ref 4.0–10.5)

## 2010-06-01 LAB — POCT CARDIAC MARKERS: CKMB, poc: <1 ng/mL — ABNORMAL LOW (ref 1.0–8.0)

## 2010-06-02 ENCOUNTER — Encounter: Payer: Self-pay | Admitting: Internal Medicine

## 2010-06-02 DIAGNOSIS — R0602 Shortness of breath: Secondary | ICD-10-CM

## 2010-06-02 DIAGNOSIS — R079 Chest pain, unspecified: Secondary | ICD-10-CM

## 2010-06-02 LAB — LIPID PANEL: Cholesterol: 268 mg/dL — ABNORMAL HIGH (ref 0–200)

## 2010-06-02 LAB — APTT: aPTT: 29 seconds (ref 24–37)

## 2010-06-02 LAB — CK TOTAL AND CKMB (NOT AT ARMC)
CK, MB: 1.2 ng/mL (ref 0.3–4.0)
Relative Index: INVALID (ref 0.0–2.5)
Total CK: 36 U/L (ref 7–177)

## 2010-06-02 LAB — CARDIAC PANEL(CRET KIN+CKTOT+MB+TROPI)
CK, MB: 1 ng/mL (ref 0.3–4.0)
Relative Index: INVALID (ref 0.0–2.5)
Total CK: 30 U/L (ref 7–177)

## 2010-06-02 LAB — PROTIME-INR: Prothrombin Time: 12.9 seconds (ref 11.6–15.2)

## 2010-06-02 NOTE — H&P (Signed)
Hospital Admission Note Date: 06/02/2010  Patient name: [name] Medical record number: [id number] Date of birth: [birth year] Age: 55 y.o. Gender: female PCP: Ludwig Lean, MD  Medical Service: Internal Medicine Teaching Service   Attending physician:  Dr. Eppie Gibson First Contact:   Dr. Shon Baton   Pager: [phone]  Second Contact:   Dr. Jerelene Redden  Pager: [phone] After Hours:    First Contact  Pager: [phone]      Second Contact  Pager: [phone]   Chief Complaint: Chest pain  History of Present Illness: Patient is a 55yo female with PMHx of CAD s/p PCI with LAD stenting in 12/2008, COPD, HTN, prediabetes, family history of early MI in mother and father, ongoing tobacco abuse, and medication noncompliance secondary to financial constraints who presents to [facility] for evaluation of chest pain. Patient describes sudden onset of substernal chest pain that began at 06:00PM on the evening prior to admission when the patient was at rest. Pain described as a constant, 8/10, nonradiating, pressure pain without notable alleviating or aggravating factors at home. Pain not worsened or alleviated by spicy foods, positional changes, or deep inspiration. However, patient does endorse reduction of pain to a 2/10 after receiving NTG in the ED. Associated with the chest pain, patient experienced difficulty catching her breath, and also describes short-lived burning epigastric pain in the days preceding admission. Otherwise, the patient denied associated diaphoresis, palpitations, tachycardia, nausea, vomiting, abdominal pain. Denies recent cough, sore throat, nasal congestion, or other symptoms suggestive of URI. Denies recent sick contacts. Denies recent long distance travel.  Of note, following her PCI with stenting in 12/2008, the patient was instructed to continue Plavix and aspirin, with close cardiology follow-up. However, secondary to financial constraints, pt has been off of all prescription medications except  Aspirin (including Plavix, pravastatin, inhalers) x 6-8 months. Patient also has not followed-up with cardiology as instructed. Furthermore, pt admits to continued tobacco abuse.   Current Outpatient Prescriptions: ** Of note, below are listed the medications that the patient was last instructed to continue. However, the patient is currently only taking ASA 369m qd and home oxygen.  Medication Sig  . albuterol (VENTOLIN HFA) 108 (90 BASE) MCG/ACT inhaler Inhale 2 puffs into the lungs. 2 puffs every 4-6 hours as needed for shortness of breath.   .Marland Kitchenaspirin 325 MG tablet Take 325 mg by mouth daily.    . clopidogrel (PLAVIX) 75 MG tablet Take 75 mg by mouth daily.    . Fluticasone-Salmeterol (ADVAIR DISKUS) 250-50 MCG/DOSE AEPB Inhale 1 puff into the lungs every 12 (twelve) hours.    . pravastatin (PRAVACHOL) 80 MG tablet Take 80 mg by mouth daily.    . Oxygen 4L [place]     Allergies: Fluconazole - swelling, itching.  Past Medical History: Diagnosis Date  . Coronary artery disease     S/P PCI of LAD with DES (12/2008). Total occlusion of RCA noted at that time., medically managed. ACS ruled out 03/2009 with Lexiscan myoview . Followed by [place].  . Pulmonary hypertension     2-D Echo (036/6440 - Systolic pressure was moderately increased. PA peak pressure  563mg. 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 Followed by Dr. WrJoya GaskinsLVelora Heckler . Diastolic dysfunction     2-D Echo (12/2008) - Normal LV Systolic funciton with EF 60-65%. Grade 1 diastolid dysfunction. No regional wall motion abnormalities. Moderate pulmonary HTN with PA peak pressure 5975m.  .Marland Kitchen  COPD (chronic obstructive pulmonary disease)     Severe. Gold Stage IV.  PFTs (12/2008) - severe obstructive airway disease. Active tobacco use. Requires 4L O2 at home.  . Pulmonary nodule, right     Small right middle lobe nodule. Stable as  of 12/2008.  Marland Kitchen Sleep apnea     Presumptive. No documented sleep studies.   . Prediabetes 12/2008    HgbA1c 6.4 (12/2008)  . Hx MRSA infection     Recurrent MRSA thigh abscesses.  . Tobacco abuse     Ongoing. 1/2 ppd since age 39yo  . Obesity   . Hyperlipidemia   . GERD (gastroesophageal reflux disease)     S/P Nissen fundoplication. (1995)    Past Surgical History: Procedure Date  . Total abdominal hysterectomy w/ bilateral salpingoophorectomy   . Nissen fundoplication 7907  . Appendectomy   . Rt knee surery     Family History: Problem Relation Age of Onset  . Heart disease Mother 54    Deceased from MI at 73yo  . Hypertension Mother   . Heart disease Father 57    Deceased of MI age 45yo  . Hypertension Father   . Hypertension Brother   . Lung cancer      Grandmother    Social History:  Social History  . Marital Status: Divorced    Number of Children: 2   Occupational History  . Former Scientist, water quality. Currently on disability    Social History Main Topics  . Smoking status: Current Everyday Smoker    Types: Cigarettes, 1/2 ppd since age 24 years old  . Smokeless tobacco: Never Used  . Alcohol Use: No, quit between 2005-2006.  . Drug Use: No  . Sexually Active: Not on file   Other Topics Concern  . Patient has not yet followed-up with Bonna Gains to assess qualification for orange card for medication assistance. States she makes too much money off of disability to qualify for Medicaid.    Review of Systems: Pertinent items are noted in HPI.  Vital Signs: T: 98 P: 78 BP: 131/60 RR: 20 O2 sat: 92 % on 2L   Physical Exam: General appearance: alert, cooperative, appears older than stated age and no distress Head: Normocephalic, without obvious abnormality, atraumatic Eyes: conjunctivae/corneas clear. PERRL, EOM's intact. Fundi benign. Nose: Nares normal. Septum midline. Mucosa normal. No drainage or sinus tenderness. Throat: lips, mucosa, and tongue normal;  teeth and gums normal Neck: no adenopathy and no JVD Lungs: BL diffuse coarse breath sounds. No wheezes appreciated. Heart: regular rate and rhythm, S1, S2 normal, no murmur, click, rub or gallop. Substernal chest pain reproducible on palpation. Abdomen: soft, non-tender; bowel sounds normal; no masses,  no organomegaly and obese Extremities: edema 2-3+ pitting pretibial edema Pulses: 2+ and symmetric  Lab results: CBC: WBC                                      9.2               4.0-10.5         K/uL RBC                                      4.83              3.87-5.11  MIL/uL Hemoglobin (HGB)                         14.3              12.0-15.0        g/dL Hematocrit (HCT)                         44.7              36.0-46.0        % MCV                                      92.5              78.0-100.0       fL MCH -                                    29.6              26.0-34.0        pg MCHC                                     32.0              30.0-36.0        g/dL RDW                                      13.1              11.5-15.5        % Platelet Count (PLT)                     179               150-400          K/uL Neutrophils, %                           65                43-77            % Lymphocytes, %                           21                12-46            % Monocytes, %                             10                3-12             % Eosinophils, %                           3  0-5              % Basophils, %                             0                 0-1              % Neutrophils, Absolute                    6.0               1.7-7.7          K/uL Lymphocytes, Absolute                    1.9               0.7-4.0          K/uL Monocytes, Absolute                      1.0               0.1-1.0          K/uL Eosinophils, Absolute                    0.3               0.0-0.7          K/uL Basophils, Absolute                      0.0                0.0-0.1          K/uL  CMET: Sodium (NA)                              143               135-145          mEq/L Potassium (K)                            3.8               3.5-5.1          mEq/L Chloride                                 102               96-112           mEq/L CO2                                      32                19-32            mEq/L Glucose                                  139  h      70-99            mg/dL BUN                                      10                6-23             mg/dL Creatinine                               0.75              0.4-1.2          mg/dL GFR, Est Non African American            >60               >60              mL/min Bilirubin, Total                         0.2        l      0.3-1.2          mg/dL Alkaline Phosphatase                     120        h      39-117           U/L SGOT (AST)                               16                0-37             U/L SGPT (ALT)                               19                0-35             U/L Total  Protein                           6.3               6.0-8.3          g/dL Albumin-Blood                            3.4        l      3.5-5.2          g/dL Calcium                                  9.2               8.4-10.5         Mg/dL  POC CE: CKMB, POC                                <  1.0       l      1.0-8.0          ng/mL Troponin I, POC                          <0.05             0.00-0.09        ng/mL Myoglobin, POC                           28.9              12-200           ng/mL  Beta Natriuretic Peptide                 <30.0             0.0-100.0        pg/mL  Imaging results:  1) CXR - Cardiomegaly and vascular congestion.  Chronic changes.   Assessment & Plan: 55 y/o female with PMHx of CAD, COPD, HLD, tobacco abuse and family history of early MI, non compliant with her plavix and statin, presents with substernal pressure-like CP going on for 1 day, at rest and with activity,  relieved with NTG and ASA in the ER.   1) CP - Differential for this patient with significant cardiac risk factors includes ACS, although CE neg x1, and patient without EKG changes > 8 hours post onset of pain. GERD exacerbation also a consideration given past severity of GERD requiring fundoplication without continued PPI therapy or GI follow-up. CP secondary to ACS or GERD may exhibit improvement with NTG, which was noted in the ED. Musculoskeletal pain also considered given reproducibility of symptoms on exam. Chest pain in the setting of severe secondary pulmonary hypertension and cor pulmonale a consider again in the setting of medication noncompliance and continued tobacco abuse. No evidence of infiltrate, PTX, widened mediastinum per CXR.  - Will admit for overnight observation - Will cycle cardiac enzymes and EKGs.  - Will get repeat 2D echo to evaluate EF, wall motion abnormality, progression of pulmonary hypertension.  - Consider inpatient cardiology consult - as patient has been lost to cardiology follow-up with [place] for > 1 year 2/2 financial constraints. Additionally, cardiology input regarding restarting Plavix and/or high dose aspirin > 1 year after placement of DES will aide management. -  Continue medical management with aspirin 325 mg, statin, smoking cessation.   2) COPD - Patient is mildly short of breath and has coarse breath sounds on exam. However, remains without wheezing or increased work of breathing to suggest acute COPD exacerbation. CXR negative for infiltrative process.  - Continue home inhalers - Continue home O2 at 4L by [place] - Social work consult for smoking cessation counseling.  3) Tobacco abuse - ongoing tobacco abuse 1/2 ppd x 40 years. - SW consult for smoking cessation   4) Financial difficulties - Patient cites financial constraints as reason for medication noncompliance x 6-8 months, and failure to have medical follow-up. Patient currently on disability,  however, does not have primary insurance.  - Will need to arrange follow-up with Clarice Pole for orange card  - Consider social work for additional medication assistance, once determine appropriate cardiac medications.   5) GERD - patient has history of severe GERD requiring Nissen fundoplication, however is not currently on daily PPI. GERD versus gastritis  may contribute to presenting symptoms. Will likely need outpatient Gi follow-up  - Start  Will treat with ppi bid in the hospital.  - Consider GI consult as an outpatient for follow-up of GERD s/p Nissen fundoplication.  6) HLD  - Check FLP - Resume home pravastatin at 80 mg daily, which may require further outpatient adjustment    7) Pre-diabetes - diagnosed 12/2008 with HgA1c of 6.4.  - Will recheck HbA1c   8) Mildly elevated ALP with normal transaminases - no symptoms of biliary obstruction and total bilirubin normal. Patient has had similar elevation of Alk phos since 2001. May reflect hepatic steatosis with h/o obesity, hyperlipidemia, tobacco abuse, versus vitamin D deficiency (although ca levels normal). Renal function normal.  - Check vitamin D levels - Will follow for now.   9) Diastolic dysfunction, grade 1 - pulmonary vascular congestion noted on CXR, patient with BL LE edema. Possible progression of disease with   - May benefit from ACEI in the setting of prediabetes and DHF versus lasix for pulmonary congestion/ peripheral edema. - Continue statin therapy - Recheck 2D echo for evaluation of progressive disease.     Chilton Greathouse, D.O. (PGY1):  ____________________________________  Date/ Time: ____________________________________    Lester Elko, M.D. (PGY2):   ____________________________________  Date/ Time: ____________________________________      I have seen and examined the patient. I reviewed the resident/fellow note and agree with the findings and plan of care as documented. My additions and revisions  are included.    Signature:  ____________________________________________     Internal Medicine Teaching Service Attending    Date:    ____________________________________________

## 2010-06-04 ENCOUNTER — Telehealth: Payer: Self-pay | Admitting: *Deleted

## 2010-06-04 ENCOUNTER — Other Ambulatory Visit: Payer: Self-pay | Admitting: Internal Medicine

## 2010-06-04 NOTE — Telephone Encounter (Signed)
Pt calls and states in order for her to get the nebulizer ordered that she needs [facility], please include the diag code on the script. Fax to [phone].

## 2010-06-04 NOTE — Discharge Summary (Signed)
Patient to follow-up with Dr. Wendee Beavers on March 13 at 815am.  Will need to see Clarice Pole re: orange card.  Address med compliance and financial difficulty at follow-up.

## 2010-06-09 ENCOUNTER — Telehealth: Payer: Self-pay | Admitting: *Deleted

## 2010-06-09 NOTE — Telephone Encounter (Signed)
Pt had called previously requesting an order for nebulizer machine. I called Dr. Shon Baton; she delivered the order to me . I faxed the order to [facility] who will deliver the machine.

## 2010-06-15 ENCOUNTER — Ambulatory Visit (INDEPENDENT_AMBULATORY_CARE_PROVIDER_SITE_OTHER): Payer: Self-pay | Admitting: Internal Medicine

## 2010-06-15 ENCOUNTER — Encounter: Payer: Self-pay | Admitting: Internal Medicine

## 2010-06-15 DIAGNOSIS — E785 Hyperlipidemia, unspecified: Secondary | ICD-10-CM

## 2010-06-15 DIAGNOSIS — I509 Heart failure, unspecified: Secondary | ICD-10-CM

## 2010-06-15 DIAGNOSIS — F17200 Nicotine dependence, unspecified, uncomplicated: Secondary | ICD-10-CM

## 2010-06-15 DIAGNOSIS — Z0000 Encounter for general adult medical examination without abnormal findings: Secondary | ICD-10-CM

## 2010-06-15 DIAGNOSIS — Z1231 Encounter for screening mammogram for malignant neoplasm of breast: Secondary | ICD-10-CM

## 2010-06-15 DIAGNOSIS — J449 Chronic obstructive pulmonary disease, unspecified: Secondary | ICD-10-CM

## 2010-06-15 DIAGNOSIS — J4489 Other specified chronic obstructive pulmonary disease: Secondary | ICD-10-CM

## 2010-06-15 DIAGNOSIS — R5381 Other malaise: Secondary | ICD-10-CM

## 2010-06-15 DIAGNOSIS — R5383 Other fatigue: Secondary | ICD-10-CM

## 2010-06-15 DIAGNOSIS — Z598 Other problems related to housing and economic circumstances: Secondary | ICD-10-CM

## 2010-06-15 DIAGNOSIS — Z599 Problem related to housing and economic circumstances, unspecified: Secondary | ICD-10-CM

## 2010-06-15 DIAGNOSIS — I2510 Atherosclerotic heart disease of native coronary artery without angina pectoris: Secondary | ICD-10-CM

## 2010-06-15 LAB — CBC
Hemoglobin: 14.7 g/dL (ref 12.0–15.0)
MCH: 29.9 pg (ref 26.0–34.0)
MCHC: 31.7 g/dL (ref 30.0–36.0)
Platelets: 177 10*3/uL (ref 150–400)
RBC: 4.92 MIL/uL (ref 3.87–5.11)

## 2010-06-15 LAB — BASIC METABOLIC PANEL
Calcium: 9.3 mg/dL (ref 8.4–10.5)
Glucose, Bld: 113 mg/dL — ABNORMAL HIGH (ref 70–99)
Potassium: 4.3 mEq/L (ref 3.5–5.3)
Sodium: 141 mEq/L (ref 135–145)

## 2010-06-15 NOTE — Assessment & Plan Note (Signed)
Uncontrolled. Patient recently started on statin 2 weeks ago. On follow up will recheck Lipid Panel, and cmet. Patient will most likely need to have statin increased and/or add second lipid lowering agent such as ezetimibe.

## 2010-06-15 NOTE — Assessment & Plan Note (Addendum)
No signs of volume overload. Continue current regimen.

## 2010-06-15 NOTE — Assessment & Plan Note (Signed)
Patient filled out paperwork for Mammogram scholarship. Will follow up.

## 2010-06-15 NOTE — Progress Notes (Deleted)
  Subjective:    Patient ID: [name], female    DOB: May 26, 1955, 55 y.o.   MRN: [id number]  HPI    Review of Systems     Objective:   Physical Exam        Assessment & Plan:

## 2010-06-15 NOTE — Assessment & Plan Note (Signed)
Counseled on smoking cessation  

## 2010-06-15 NOTE — Patient Instructions (Signed)
Make a follow up appointment in 1 month. Go to Education officer, museum referral for [employer]. Continue taking all medication as directed.

## 2010-06-15 NOTE — Assessment & Plan Note (Signed)
Stable. Continue aspirin/betablocker/statin. Once patient gets [employer] will refer to see Cardiologist for follow up.

## 2010-06-15 NOTE — Progress Notes (Signed)
  Subjective:    Patient ID: [name], female    DOB: 1955/11/15, 55 y.o.   MRN: [id number]  HPI  55 yr old woman with  Past Medical History  Diagnosis Date  . Coronary artery disease     S/P PCI of LAD with DES (12/2008). Total occlusion of RCA noted at that time., medically managed. ACS ruled out 03/2009 with Lexiscan myoview . Followed by [facility].  . Pulmonary hypertension     2-D Echo (70/9643) - Systolic pressure was moderately increased. PA peak pressure  3mHg. 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 Followed by Dr. WJoya Gaskins(Velora Heckler  . Diastolic dysfunction     2-D Echo (12/2008) - Normal LV Systolic funciton with EF 60-65%. Grade 1 diastolid dysfunction. No regional wall motion abnormalities. Moderate pulmonary HTN with PA peak pressure 545mg.  . Marland KitchenOPD (chronic obstructive pulmonary disease)     Severe. Gold Stage IV.  PFTs (12/2008) - severe obstructive airway disease. Active tobacco use. Requires 4L O2 at home.  . Pulmonary nodule, right     Small right middle lobe nodule. Stable as of 12/2008.  . Marland Kitchenleep apnea     Presumptive. No documented sleep studies.   . Prediabetes 12/2008    HgbA1c 6.4 (12/2008)  . Hx MRSA infection     Recurrent MRSA thigh abscesses.  . Tobacco abuse     Ongoing.  . Obesity   . Hyperlipidemia   . GERD (gastroesophageal reflux disease)     S/P Nissen fundoplication.   comes to the clinic for hospital follow up of Chest pain. Patient reports that chest pain resolved.   Reports that since starting medication she has been feeling weaker than usual. Denies more shortness of breath than usual, palpitations, diaphoresis, dysuria, frequency, hematochezia, or melena.  Hasn't seen Pulmonologist or Cardiologist, or gotten Mammogram over a year because she doesn't have the funds to do these tests. Reports to be taking all medication as directed.      Review of Systems  Constitutional: Positive for fatigue. Negative for fever and chills.  HENT: Negative.   Eyes: Negative.   Respiratory: Negative for cough, chest tightness and wheezing.   Cardiovascular: Negative for chest pain and palpitations.  Gastrointestinal: Negative.   Genitourinary: Negative.   Musculoskeletal: Negative.   Neurological: Negative.   Psychiatric/Behavioral: Negative.        Objective:   Physical Exam  Vitals reviewed. Constitutional: She is oriented to person, place, and time. She appears well-developed and well-nourished. No distress.       NAD on oxygen 2L, not using accessory muscles, talking in full sentences  HENT:  Mouth/Throat: Oropharynx is clear and moist.  Eyes: Conjunctivae and EOM are normal. Pupils are equal, round, and reactive to light.  Neck: Normal range of motion. Neck supple.  Cardiovascular: Normal rate, regular rhythm and normal heart sounds.   Pulmonary/Chest: Effort normal and breath sounds normal. No respiratory distress. She has no wheezes.  Abdominal: Soft. Bowel sounds are normal.  Musculoskeletal: Normal range of motion.  Neurological: She is alert and oriented to person, place, and time.  Skin: She is not diaphoretic.  Psychiatric: She has a normal mood and affect.          Assessment & Plan:

## 2010-06-15 NOTE — Assessment & Plan Note (Signed)
Stable. Continue current regimen. On follow up if patient has received [employer] will refer to Pulmonologist for follow up.

## 2010-06-15 NOTE — Assessment & Plan Note (Signed)
Refer to Education officer, museum for [employer]

## 2010-06-15 NOTE — Assessment & Plan Note (Addendum)
Most likely 2/2 to newly started beta blocker. Respiratory status stable. Continue betablocker for CAD. Will check CBC and Bmet.

## 2010-06-16 ENCOUNTER — Telehealth: Payer: Self-pay | Admitting: Licensed Clinical Social Worker

## 2010-06-16 LAB — POCT CARDIAC MARKERS
CKMB, poc: <1 ng/mL — ABNORMAL LOW (ref 1.0–8.0)
Myoglobin, poc: 23.1 ng/mL (ref 12–200)
Troponin i, poc: <0.05 ng/mL (ref 0.00–0.09)

## 2010-06-16 LAB — COMPREHENSIVE METABOLIC PANEL
ALT: 26 U/L (ref 0–35)
Alkaline Phosphatase: 112 U/L (ref 39–117)
BUN: 8 mg/dL (ref 6–23)
CO2: 38 mEq/L — ABNORMAL HIGH (ref 19–32)
Chloride: 99 mEq/L (ref 96–112)
GFR calc non Af Amer: >60 mL/min (ref >60)
Glucose, Bld: 104 mg/dL — ABNORMAL HIGH (ref 70–99)
Potassium: 3.8 mEq/L (ref 3.5–5.1)
Sodium: 143 mEq/L (ref 135–145)
Total Bilirubin: 0.5 mg/dL (ref 0.3–1.2)

## 2010-06-16 LAB — CBC
HCT: 44.8 % (ref 36.0–46.0)
Hemoglobin: 14.7 g/dL (ref 12.0–15.0)
MCV: 92.9 fL (ref 78.0–100.0)
RDW: 13.3 % (ref 11.5–15.5)
WBC: 7.4 10*3/uL (ref 4.0–10.5)

## 2010-06-16 LAB — DIFFERENTIAL
Basophils Absolute: 0 10*3/uL (ref 0.0–0.1)
Basophils Relative: 0 % (ref 0–1)
Eosinophils Absolute: 0.3 10*3/uL (ref 0.0–0.7)
Monocytes Relative: 10 % (ref 3–12)
Neutro Abs: 4 10*3/uL (ref 1.7–7.7)
Neutrophils Relative %: 54 % (ref 43–77)

## 2010-06-16 LAB — BRAIN NATRIURETIC PEPTIDE: Pro B Natriuretic peptide (BNP): 57 pg/mL (ref 0.0–100.0)

## 2010-06-16 LAB — D-DIMER, QUANTITATIVE: D-Dimer, Quant: 0.24 ug/mL-FEU (ref 0.00–0.48)

## 2010-06-17 NOTE — Telephone Encounter (Signed)
Extensive phone assessment and counseling with patient. Multiple health issues including COPD with oxygen. Patient said that she could not get in to see me because of her vehicle not working.   Patient informs me that she has been on SSD for one year and makes $1,212.  She will be eligible for Medicare in one year and is not eligible for Medicaid.  She as of yet has not met with Marlana Latus however I encouraged her to do so to obtain our sliding scale discount.   Patient said that her meds cost around $50 per month through [facility] and Dr. Shon Baton reworked her RX's to the [facility] $4 list.  She reports she is able to afford $50 per month.   The patient does not qualify for Foodstamps and she splits the rent and utilities with her roommate.  Rent is $575 per month.   Smoking Cessation:  The patient is not yet ready to quit smoking but she agreed to have me mail out the Quitline information and she knows that they are providing free patches or nicotine gum.   Colonscopy:  She informs me she needs a colonscopy and I will refer her to [facility] to speak with her about the study.   Transit:  I have given the patient information about the [employer] program so she can get to medical appointments.  Social Work as needed.

## 2010-06-17 NOTE — Telephone Encounter (Signed)
06/17/10  Left message

## 2010-06-21 LAB — DIFFERENTIAL
Basophils Absolute: 0 10*3/uL (ref 0.0–0.1)
Basophils Relative: 1 % (ref 0–1)
Monocytes Absolute: 0.6 10*3/uL (ref 0.1–1.0)
Neutro Abs: 4.4 10*3/uL (ref 1.7–7.7)

## 2010-06-21 LAB — POCT I-STAT, CHEM 8
Calcium, Ion: 1.11 mmol/L — ABNORMAL LOW (ref 1.12–1.32)
Chloride: 98 mEq/L (ref 96–112)
HCT: 46 % (ref 36.0–46.0)
Sodium: 142 mEq/L (ref 135–145)
TCO2: 41 mmol/L (ref 0–100)

## 2010-06-21 LAB — CBC
HCT: 43 % (ref 36.0–46.0)
MCHC: 33.9 g/dL (ref 30.0–36.0)
Platelets: 132 10*3/uL — ABNORMAL LOW (ref 150–400)
RDW: 14.1 % (ref 11.5–15.5)

## 2010-06-22 NOTE — Telephone Encounter (Addendum)
06/22/10 Called [facility] regarding colon research study and told her Beckie Busing was willing to make a home visit to begin process.  Appmt set for Thurs.  3/22 at 3:45 and patient was agreeable to that. Sampson Si

## 2010-06-25 LAB — WOUND CULTURE

## 2010-06-25 LAB — DIFFERENTIAL
Lymphs Abs: 1.4 10*3/uL (ref 0.7–4.0)
Monocytes Relative: 9 % (ref 3–12)
Neutro Abs: 6.8 10*3/uL (ref 1.7–7.7)
Neutrophils Relative %: 73 % (ref 43–77)

## 2010-06-25 LAB — CBC
RBC: 5.13 MIL/uL — ABNORMAL HIGH (ref 3.87–5.11)
WBC: 9.3 10*3/uL (ref 4.0–10.5)

## 2010-06-25 LAB — BASIC METABOLIC PANEL
Calcium: 9.3 mg/dL (ref 8.4–10.5)
Chloride: 101 mEq/L (ref 96–112)
Creatinine, Ser: 0.65 mg/dL (ref 0.4–1.2)
GFR calc Af Amer: >60 mL/min (ref >60)

## 2010-06-26 NOTE — Discharge Summary (Signed)
NAMEKRISTEE, [name]                  ACCOUNT NO.:  [id number]  MEDICAL RECORD NO.:  [id number]           PATIENT TYPE:  I  LOCATION:  2503                         FACILITY:  [facility]  PHYSICIAN:  Oval Linsey, MD     DATE OF BIRTH:  03/26/1956  DATE OF ADMISSION:  06/01/2010 DATE OF DISCHARGE:  06/02/2010                              DISCHARGE SUMMARY   ATTENDING PHYSICIAN:  Oval Linsey, MD  DISCHARGE DIAGNOSES: 1. Atypical chest pain likely secondary to costochondritis. 2. Coronary artery disease status post percutaneous coronary     intervention in September 2010. 3. Pulmonary hypertension. 4. Chronic obstructive pulmonary disease. 5. Tobacco abuse. 6. Gastroesophageal reflux disease. 7. Hyperlipidemia. 8. Prediabetes. 9. Grade 1 diastolic dysfunction per echocardiogram in 2010. 10.Obstructive sleep apnea, which is presumptive. 11.Ongoing tobacco abuse. 12.Obesity.  DISCHARGE MEDICATIONS: 1. Albuterol nebulizer 0.5% solution 2.5 mg inhaled every 6 hours as     needed for shortness of breath or wheezing. 2. Aspirin 81 mg by mouth daily. 3. Atrovent 500 mcg inhaled every 6 hours, use when awake     approximately 3 times a day, no more than 4 times in 24-hour     period. 4. Tylenol 650 mg by mouth every 6 hours as needed for pain. 5. Coreg 3.125 mg by mouth twice daily. 6. Ibuprofen 400 mg by mouth every 8 hours as needed for pain, do not     take more than prescribed amount, do not take other medicines, such     as Motrin, Aleve, BC, or Goody's powders with this medicine. 7. Pravastatin 40 mg by mouth daily. 8. Ranitidine 150 mg by mouth twice daily.  DISPOSITION AND FOLLOWUP:  [name] was discharged from the [facility] on June 02, 2010, in stable and improved condition.  Her chest pain was improved.  She is to follow up in the outpatient medicine clinic with Dr. Wendee Beavers on June 15, 2010, at 8:50 in the morning.  At that time, she should bring to [facility] the following: picture ID, tax return W2s from 2011, four current pay slips if working, a current bank statement, tax values of her home, and food stamps approval letters so she can undergo approval for an orange card.  It will need to be assured at her follow-up appointment that she has taken her nebulizers as instructed.  She will need to be questioned as to the stability of her financial situation.  CONSULTATIONS:  None.  PROCEDURES PERFORMED:  Two-view chest x-ray June 01, 2010, showing cardiomegaly and vascular congestion, chronic changes.  ADMISSION HISTORY:  [name] is a 55 year old woman with a past medical history of coronary artery disease status post percutaneous coronary intervention with LAD stenting in September 2010, chronic obstructive pulmonary disease, hypertension, prediabetes, a family history of early myocardial infarction in her mother and her father, ongoing tobacco abuse and medication noncompliance secondary to financial constraints who presents to the [facility] for evaluation of chest pain.  She describes sudden onset of substernal chest pain that began 6:00 p.m. the evening prior to admission when she was at  rest.  She describes a constant 8/10 nonradiating pressure-like pain without notable alleviating or aggravating factors at home.  Pain is not worsened or alleviated by spicy foods, positional changes, or deep inspiration.  However, she does endorse reduction of pain to 2/10 after receiving nitroglycerin in the emergency department.  Today with the chest pain, she experienced difficulty catching her breath, and also describes short-lived burning epigastric pain days prior to admission.  Otherwise, she denies any associated diaphoresis, palpitations, tachycardia, nausea, vomiting, or abdominal pain.  She denies any recent cough, sore throat, nasal congestion, or other symptoms suggestive of URI.  She denies recent sick contacts.   Denies recent long distance travel.  Of note, following her PCI with stenting in September 2010, she was instructed to continue Plavix and aspirin with close Cardiology follow-up.  However, secondary to financial constraints, she has been off all prescription medicines, except for aspirin, including her Plavix, pravastatin, and inhalers for 6-8 months.  She also has not followed up with Cardiology as instructed.  Furthermore, she admits to continued tobacco abuse.  PHYSICAL EXAMINATION: VITAL SIGNS:  Temperature 98, pulse 78, blood pressure 131/60,  respiratory rate 20, O2 sat 92% on 2 liters. GENERAL APPEARANCE:  Alert, cooperative, appears older than stated age, no acute distress. HEAD:  Normocephalic without obvious abnormality, atraumatic. EYES:  Conjunctivae and corneas are clear.  Pupils equal, round, reactive to light.  Extraocular movements intact.  Fundi benign. NOSE:  Nares are normal, septum midline.  Mucosa normal.  No drainage or sinus tenderness. THROAT:  Lips, mucosa and tongue are normal.  Teeth and gums are normal. NECK:  No adenopathy or JVD. LUNGS:  Bilateral diffuse coarse breath sounds.  No wheezes appreciated. HEART:  Regular rate and rhythm.  S1 and S2 normal.  No murmurs, clicks, rubs, or gallops.  Substernal chest pain is reproducible on palpation. ABDOMEN:  Soft, nontender.  Bowel sounds are normal.  No masses or organomegaly. Obese. EXTREMITIES:  Pitting pretibial edema 2 to 3+.  Pulses 2+ and symmetric.  ADMISSION LABORATORY DATA:  White blood cell count 9.2, hemoglobin 14.3, hematocrit 44.7, platelets 179. Sodium 143, potassium 3.8, chloride 102, bicarb 32, BUN 10, creatinine 0.75, glucose 139, total bili 0.2, alk phos 128, AST 16, ALT 19, total protein 6.3, albumin 3.4, calcium 9.2. Point-of-care cardiac markers, CK-MB less than 1, troponin less than 0.05, myoglobin 28.9. BNP less than 30.  HOSPITAL COURSE BY PROBLEM: 1. Atypical chest pain.  On  presentation, [name] had atypical     chest pain that was reproducible on physical exam most suggestive     of costochondritis.  However, given her significant cardiac risk     factors, she was admitted for chest pain rule out.  Cardiac enzymes     were negative x 3, and her EKG was without any change.  Given her     coronary artery disease history, it is important that she remain on     aspirin and beta-blocker.  She also was discharged with a statin     and on an H2 blocker.  At follow-up, it will need to be assessed     whether or not she has been compliant with her medical     regimen.  She was also given instructions to take low dose     ibuprofen for her costochondritis.  2. Chronic obstructive pulmonary disease.  [name] was continued     on her home bronchodilators and because of financial constraints,  was discharged on nebulizer treatments.  Social work consult for     smoking cessation counseling was also ordered, as she is a long-     term smoker, but is still actively smoking approximately half a pack     a day.  It is very important that she follow up with social work in     the outpatient medicine clinic to try to receive the [facility] card to     try to reduce cost as she is on expensive medicines for her chronic     obstructive pulmonary disease.  3. Tobacco abuse.  See above.  4. Gastroesophageal reflux disease.  [name] does have a history     of severe gastroesophageal reflux disease requiring Nissen     fundoplication.  Recurrence of her reflux disease may have     contributed to her presentation, and she was discharged home on an     H2 blocker given its reduced cost as compared to a proton pump     inhibitor.  5. Hyperlipidemia.  Fasting lipid panel was checked, and she was     above goal with cholesterol 268, triglycerides 124, HDL 41, LDL 202.       She was discharged on 40 mg of pravastatin daily because of cost.       However, if she is able to get the  orange card, at follow-up it would      be better if she is placed on more powerful statin treatment.  6. Prediabetes.  The patient was diagnosed with this in September 2010     and had a hemoglobin A1c at that time of 6.4%.  Repeat hemoglobin     A1c at this hospitalization showed a hemoglobin A1c of 5.9%.  She     should be monitored periodically as an outpatient.  DISCHARGE VITAL SIGNS:  Temperature 98.2, pulse 64, blood pressure 137/87, respiratory rate 15, O2 sat 99% on 4 L.  DISCHARGE LABORATORY DATA:  TSH 2.264, hemoglobin A1c 5.9%, PT 12.9, PTT 29.   ______________________________ Rikki Spearing, MD   ______________________________ Oval Linsey, MD   JW/MEDQ  D:  06/04/2010  T:  06/05/2010  Job:  [id number]  cc:   Ludwig Lean, MD  Electronically Signed by Rikki Spearing MD on 06/17/2010 05:50:10 PM Electronically Signed by Oval Linsey  on 06/26/2010 12:33:35 PM

## 2010-06-28 NOTE — Telephone Encounter (Deleted)
Date of below phone call should read 06/22/10.

## 2010-07-06 LAB — CBC
HCT: 37.8 % (ref 36.0–46.0)
Hemoglobin: 13.1 g/dL (ref 12.0–15.0)
MCHC: 34.7 g/dL (ref 30.0–36.0)
Platelets: 129 10*3/uL — ABNORMAL LOW (ref 150–400)
RDW: 13.3 % (ref 11.5–15.5)
RDW: 13.8 % (ref 11.5–15.5)

## 2010-07-06 LAB — HEPARIN LEVEL (UNFRACTIONATED)
Heparin Unfractionated: 0.22 IU/mL — ABNORMAL LOW (ref 0.30–0.70)
Heparin Unfractionated: 0.36 IU/mL (ref 0.30–0.70)

## 2010-07-06 LAB — BASIC METABOLIC PANEL
CO2: 34 mEq/L — ABNORMAL HIGH (ref 19–32)
Calcium: 9 mg/dL (ref 8.4–10.5)
Creatinine, Ser: 0.75 mg/dL (ref 0.4–1.2)
Glucose, Bld: 108 mg/dL — ABNORMAL HIGH (ref 70–99)

## 2010-07-07 LAB — COMPREHENSIVE METABOLIC PANEL
ALT: 20 U/L (ref 0–35)
AST: 13 U/L (ref 0–37)
Albumin: 3.3 g/dL — ABNORMAL LOW (ref 3.5–5.2)
Alkaline Phosphatase: 119 U/L — ABNORMAL HIGH (ref 39–117)
Alkaline Phosphatase: 122 U/L — ABNORMAL HIGH (ref 39–117)
BUN: 9 mg/dL (ref 6–23)
BUN: 6 mg/dL (ref 6–23)
CO2: 31 mEq/L (ref 19–32)
Chloride: 99 mEq/L (ref 96–112)
Chloride: 96 mEq/L (ref 96–112)
Creatinine, Ser: 0.64 mg/dL (ref 0.4–1.2)
GFR calc non Af Amer: >60 mL/min (ref >60)
Potassium: 3.8 mEq/L (ref 3.5–5.1)
Potassium: 3.5 mEq/L (ref 3.5–5.1)
Sodium: 136 mEq/L (ref 135–145)
Total Bilirubin: 0.7 mg/dL (ref 0.3–1.2)
Total Bilirubin: 1 mg/dL (ref 0.3–1.2)

## 2010-07-07 LAB — DRUGS OF ABUSE SCREEN W/O ALC, ROUTINE URINE
Cocaine Metabolites: NEGATIVE
Creatinine,U: 75.6 mg/dL
Opiate Screen, Urine: NEGATIVE
Phencyclidine (PCP): NEGATIVE

## 2010-07-07 LAB — CULTURE, BLOOD (ROUTINE X 2): Culture: NO GROWTH

## 2010-07-07 LAB — CBC
HCT: 36.6 % (ref 36.0–46.0)
HCT: 38.1 % (ref 36.0–46.0)
HCT: 43.1 % (ref 36.0–46.0)
Hemoglobin: 12.7 g/dL (ref 12.0–15.0)
Hemoglobin: 13.2 g/dL (ref 12.0–15.0)
Hemoglobin: 14.2 g/dL (ref 12.0–15.0)
Hemoglobin: 14.7 g/dL (ref 12.0–15.0)
MCHC: 35.2 g/dL (ref 30.0–36.0)
MCHC: 34.6 g/dL (ref 30.0–36.0)
MCHC: 34.7 g/dL (ref 30.0–36.0)
MCV: 91.6 fL (ref 78.0–100.0)
Platelets: 158 10*3/uL (ref 150–400)
Platelets: 137 10*3/uL — ABNORMAL LOW (ref 150–400)
RBC: 4.25 MIL/uL (ref 3.87–5.11)
RBC: 4.16 MIL/uL (ref 3.87–5.11)
RBC: 4.44 MIL/uL (ref 3.87–5.11)
RDW: 13.5 % (ref 11.5–15.5)
RDW: 13.1 % (ref 11.5–15.5)
RDW: 13.3 % (ref 11.5–15.5)
WBC: 11.7 10*3/uL — ABNORMAL HIGH (ref 4.0–10.5)
WBC: 8.9 10*3/uL (ref 4.0–10.5)

## 2010-07-07 LAB — BASIC METABOLIC PANEL
BUN: 5 mg/dL — ABNORMAL LOW (ref 6–23)
CO2: 34 mEq/L — ABNORMAL HIGH (ref 19–32)
CO2: 36 mEq/L — ABNORMAL HIGH (ref 19–32)
Calcium: 8.5 mg/dL (ref 8.4–10.5)
Calcium: 8.8 mg/dL (ref 8.4–10.5)
Chloride: 94 mEq/L — ABNORMAL LOW (ref 96–112)
Creatinine, Ser: 0.76 mg/dL (ref 0.4–1.2)
GFR calc Af Amer: >60 mL/min (ref >60)
GFR calc Af Amer: >60 mL/min (ref >60)
GFR calc non Af Amer: >60 mL/min (ref >60)
GFR calc non Af Amer: >60 mL/min (ref >60)
GFR calc non Af Amer: >60 mL/min (ref >60)
Glucose, Bld: 106 mg/dL — ABNORMAL HIGH (ref 70–99)
Potassium: 3.9 mEq/L (ref 3.5–5.1)
Potassium: 3.9 mEq/L (ref 3.5–5.1)
Sodium: 134 mEq/L — ABNORMAL LOW (ref 135–145)
Sodium: 138 mEq/L (ref 135–145)
Sodium: 144 mEq/L (ref 135–145)

## 2010-07-07 LAB — POCT CARDIAC MARKERS
CKMB, poc: <1 ng/mL — ABNORMAL LOW (ref 1.0–8.0)
Myoglobin, poc: 53.7 ng/mL (ref 12–200)
Troponin i, poc: <0.05 ng/mL (ref 0.00–0.09)

## 2010-07-07 LAB — POCT I-STAT, CHEM 8
Chloride: 97 mEq/L (ref 96–112)
HCT: 40 % (ref 36.0–46.0)
Potassium: 3.7 mEq/L (ref 3.5–5.1)
Sodium: 139 mEq/L (ref 135–145)

## 2010-07-07 LAB — PROTIME-INR
INR: 0.98 (ref 0.00–1.49)
Prothrombin Time: 12.9 seconds (ref 11.6–15.2)

## 2010-07-07 LAB — TROPONIN I: Troponin I: 0.01 ng/mL (ref 0.00–0.06)

## 2010-07-07 LAB — DIFFERENTIAL
Basophils Absolute: 0 10*3/uL (ref 0.0–0.1)
Basophils Relative: 0 % (ref 0–1)
Monocytes Relative: 10 % (ref 3–12)
Neutro Abs: 4.8 10*3/uL (ref 1.7–7.7)
Neutrophils Relative %: 58 % (ref 43–77)

## 2010-07-07 LAB — CULTURE, ROUTINE-ABSCESS

## 2010-07-07 LAB — URINALYSIS, ROUTINE W REFLEX MICROSCOPIC
Ketones, ur: NEGATIVE mg/dL
Nitrite: NEGATIVE
Protein, ur: NEGATIVE mg/dL
Urobilinogen, UA: 1 mg/dL (ref 0.0–1.0)

## 2010-07-07 LAB — ANAEROBIC CULTURE

## 2010-07-07 LAB — BRAIN NATRIURETIC PEPTIDE: Pro B Natriuretic peptide (BNP): 35 pg/mL (ref 0.0–100.0)

## 2010-07-09 LAB — DIFFERENTIAL
Basophils Absolute: 0 10*3/uL (ref 0.0–0.1)
Basophils Relative: 0 % (ref 0–1)
Basophils Relative: 1 % (ref 0–1)
Eosinophils Absolute: 0.2 10*3/uL (ref 0.0–0.7)
Lymphocytes Relative: 8 % — ABNORMAL LOW (ref 12–46)
Lymphs Abs: 1.8 10*3/uL (ref 0.7–4.0)
Monocytes Relative: 5 % (ref 3–12)
Neutro Abs: 8 10*3/uL — ABNORMAL HIGH (ref 1.7–7.7)
Neutrophils Relative %: 63 % (ref 43–77)
Neutrophils Relative %: 86 % — ABNORMAL HIGH (ref 43–77)

## 2010-07-09 LAB — BASIC METABOLIC PANEL
CO2: 36 mEq/L — ABNORMAL HIGH (ref 19–32)
CO2: 38 mEq/L — ABNORMAL HIGH (ref 19–32)
CO2: 39 mEq/L — ABNORMAL HIGH (ref 19–32)
Calcium: 9.1 mg/dL (ref 8.4–10.5)
Calcium: 9.2 mg/dL (ref 8.4–10.5)
Calcium: 9.3 mg/dL (ref 8.4–10.5)
Creatinine, Ser: 0.58 mg/dL (ref 0.4–1.2)
Creatinine, Ser: 0.66 mg/dL (ref 0.4–1.2)
Creatinine, Ser: 0.75 mg/dL (ref 0.4–1.2)
GFR calc Af Amer: >60 mL/min (ref >60)
GFR calc Af Amer: >60 mL/min (ref >60)
GFR calc Af Amer: >60 mL/min (ref >60)
GFR calc non Af Amer: >60 mL/min (ref >60)
GFR calc non Af Amer: >60 mL/min (ref >60)
Sodium: 137 mEq/L (ref 135–145)
Sodium: 142 mEq/L (ref 135–145)

## 2010-07-09 LAB — POCT I-STAT 3, ART BLOOD GAS (G3+)
Patient temperature: 97
pCO2 arterial: 74.7 mmHg (ref 35.0–45.0)
pH, Arterial: 7.389 (ref 7.350–7.400)
pO2, Arterial: 59 mmHg — ABNORMAL LOW (ref 80.0–100.0)

## 2010-07-09 LAB — SEDIMENTATION RATE: Sed Rate: 1 mm/hr (ref 0–22)

## 2010-07-09 LAB — BLOOD GAS, ARTERIAL
Acid-Base Excess: 7.1 mmol/L — ABNORMAL HIGH (ref 0.0–2.0)
Bicarbonate: 36.1 mEq/L — ABNORMAL HIGH (ref 20.0–24.0)
Drawn by: 270211
FIO2: 0.21 %
FIO2: 1 %
pCO2 arterial: 60.7 mmHg (ref 35.0–45.0)
pCO2 arterial: 66 mmHg (ref 35.0–45.0)
pH, Arterial: 7.32 — ABNORMAL LOW (ref 7.350–7.400)
pH, Arterial: 7.392 (ref 7.350–7.400)
pO2, Arterial: 44.5 mmHg — ABNORMAL LOW (ref 80.0–100.0)

## 2010-07-09 LAB — COMPREHENSIVE METABOLIC PANEL
ALT: 19 U/L (ref 0–35)
Alkaline Phosphatase: 108 U/L (ref 39–117)
BUN: 5 mg/dL — ABNORMAL LOW (ref 6–23)
CO2: 33 mEq/L — ABNORMAL HIGH (ref 19–32)
Calcium: 9.2 mg/dL (ref 8.4–10.5)
GFR calc non Af Amer: >60 mL/min (ref >60)
Glucose, Bld: 113 mg/dL — ABNORMAL HIGH (ref 70–99)
Sodium: 141 mEq/L (ref 135–145)

## 2010-07-09 LAB — POCT I-STAT 3, VENOUS BLOOD GAS (G3P V)
Acid-Base Excess: 8 mmol/L — ABNORMAL HIGH (ref 0.0–2.0)
Patient temperature: 37
Patient temperature: 37
pCO2, Ven: 76.6 mmHg (ref 45.0–50.0)
pH, Ven: 7.305 — ABNORMAL HIGH (ref 7.250–7.300)
pH, Ven: 7.327 — ABNORMAL HIGH (ref 7.250–7.300)
pO2, Ven: 40 mmHg (ref 30.0–45.0)

## 2010-07-09 LAB — CBC
Hemoglobin: 17.4 g/dL — ABNORMAL HIGH (ref 12.0–15.0)
Hemoglobin: 17.6 g/dL — ABNORMAL HIGH (ref 12.0–15.0)
MCHC: 33 g/dL (ref 30.0–36.0)
MCHC: 33.1 g/dL (ref 30.0–36.0)
MCHC: 33.6 g/dL (ref 30.0–36.0)
MCHC: 34 g/dL (ref 30.0–36.0)
MCV: 94.3 fL (ref 78.0–100.0)
RBC: 5.29 MIL/uL — ABNORMAL HIGH (ref 3.87–5.11)
RBC: 5.3 MIL/uL — ABNORMAL HIGH (ref 3.87–5.11)
RBC: 5.5 MIL/uL — ABNORMAL HIGH (ref 3.87–5.11)
RBC: 5.52 MIL/uL — ABNORMAL HIGH (ref 3.87–5.11)
RDW: 14.8 % (ref 11.5–15.5)
RDW: 14.9 % (ref 11.5–15.5)
WBC: 8.4 10*3/uL (ref 4.0–10.5)

## 2010-07-09 LAB — APTT: aPTT: 28 seconds (ref 24–37)

## 2010-07-09 LAB — CARDIAC PANEL(CRET KIN+CKTOT+MB+TROPI)
CK, MB: 2.2 ng/mL (ref 0.3–4.0)
Relative Index: INVALID (ref 0.0–2.5)
Troponin I: 0.02 ng/mL (ref 0.00–0.06)

## 2010-07-09 LAB — RHEUMATOID FACTOR: Rhuematoid fact SerPl-aCnc: <20 IU/mL (ref 0–20)

## 2010-07-09 LAB — TSH: TSH: 1.598 u[IU]/mL (ref 0.350–4.500)

## 2010-07-09 LAB — CK TOTAL AND CKMB (NOT AT ARMC): Relative Index: INVALID (ref 0.0–2.5)

## 2010-07-09 LAB — HIV ANTIBODY (ROUTINE TESTING W REFLEX): HIV: NONREACTIVE

## 2010-07-09 LAB — ANA: Anti Nuclear Antibody(ANA): NEGATIVE

## 2010-07-09 LAB — LIPID PANEL
Cholesterol: 210 mg/dL — ABNORMAL HIGH (ref 0–200)
HDL: 38 mg/dL — ABNORMAL LOW (ref >39)

## 2010-07-09 LAB — TECHNOLOGIST SMEAR REVIEW: Path Review: DECREASED

## 2010-07-09 LAB — TROPONIN I: Troponin I: 0.02 ng/mL (ref 0.00–0.06)

## 2010-07-09 LAB — C-REACTIVE PROTEIN: CRP: 0.5 mg/dL — ABNORMAL LOW (ref <0.6)

## 2010-07-09 LAB — PROTIME-INR: INR: 1 (ref 0.00–1.49)

## 2010-07-12 LAB — BRAIN NATRIURETIC PEPTIDE: Pro B Natriuretic peptide (BNP): 337 pg/mL — ABNORMAL HIGH (ref 0.0–100.0)

## 2010-07-12 LAB — DIFFERENTIAL
Lymphocytes Relative: 34 % (ref 12–46)
Monocytes Absolute: 0.6 10*3/uL (ref 0.1–1.0)
Monocytes Relative: 8 % (ref 3–12)
Neutro Abs: 4.2 10*3/uL (ref 1.7–7.7)

## 2010-07-12 LAB — BASIC METABOLIC PANEL
Calcium: 8.8 mg/dL (ref 8.4–10.5)
Chloride: 99 mEq/L (ref 96–112)
Creatinine, Ser: 0.8 mg/dL (ref 0.4–1.2)
GFR calc Af Amer: >60 mL/min (ref >60)
GFR calc non Af Amer: >60 mL/min (ref >60)

## 2010-07-12 LAB — CBC
Hemoglobin: 16.7 g/dL — ABNORMAL HIGH (ref 12.0–15.0)
RBC: 5.43 MIL/uL — ABNORMAL HIGH (ref 3.87–5.11)
WBC: 7.7 10*3/uL (ref 4.0–10.5)

## 2010-07-12 LAB — POCT CARDIAC MARKERS
CKMB, poc: <1 ng/mL — ABNORMAL LOW (ref 1.0–8.0)
Troponin i, poc: <0.05 ng/mL (ref 0.00–0.09)

## 2010-07-24 ENCOUNTER — Emergency Department (HOSPITAL_COMMUNITY)
Admission: EM | Admit: 2010-07-24 | Discharge: 2010-07-24 | Disposition: A | Discharge status: Home / Self Care | Payer: Self-pay | Attending: Emergency Medicine | Admitting: Emergency Medicine

## 2010-07-24 ENCOUNTER — Emergency Department (HOSPITAL_COMMUNITY): Payer: Self-pay

## 2010-07-24 DIAGNOSIS — I2510 Atherosclerotic heart disease of native coronary artery without angina pectoris: Secondary | ICD-10-CM | POA: Insufficient documentation

## 2010-07-24 DIAGNOSIS — J449 Chronic obstructive pulmonary disease, unspecified: Secondary | ICD-10-CM | POA: Insufficient documentation

## 2010-07-24 DIAGNOSIS — S92309A Fracture of unspecified metatarsal bone(s), unspecified foot, initial encounter for closed fracture: Secondary | ICD-10-CM | POA: Insufficient documentation

## 2010-07-24 DIAGNOSIS — J4489 Other specified chronic obstructive pulmonary disease: Secondary | ICD-10-CM | POA: Insufficient documentation

## 2010-07-24 DIAGNOSIS — IMO0002 Reserved for concepts with insufficient information to code with codable children: Secondary | ICD-10-CM | POA: Insufficient documentation

## 2010-07-24 DIAGNOSIS — W108XXA Fall (on) (from) other stairs and steps, initial encounter: Secondary | ICD-10-CM | POA: Insufficient documentation

## 2010-07-24 DIAGNOSIS — S82899A Other fracture of unspecified lower leg, initial encounter for closed fracture: Secondary | ICD-10-CM | POA: Insufficient documentation

## 2010-07-24 DIAGNOSIS — M79609 Pain in unspecified limb: Secondary | ICD-10-CM | POA: Insufficient documentation

## 2010-07-24 DIAGNOSIS — M25579 Pain in unspecified ankle and joints of unspecified foot: Secondary | ICD-10-CM | POA: Insufficient documentation

## 2010-07-24 DIAGNOSIS — Z8614 Personal history of Methicillin resistant Staphylococcus aureus infection: Secondary | ICD-10-CM | POA: Insufficient documentation

## 2010-07-24 DIAGNOSIS — Y92009 Unspecified place in unspecified non-institutional (private) residence as the place of occurrence of the external cause: Secondary | ICD-10-CM | POA: Insufficient documentation

## 2010-08-17 NOTE — H&P (Signed)
NAMEBRIANNA, BENNETT NO.:  [id number]   MEDICAL RECORD NO.:  [id number]          PATIENT TYPE:  EMS   LOCATION:  MAJO                         FACILITY:  [place]   PHYSICIAN:  Edythe Lynn, M.D.       DATE OF BIRTH:  [birth year]   DATE OF ADMISSION:  06/06/2007  DATE OF DISCHARGE:                              HISTORY & PHYSICAL   PRIMARY CARE PHYSICIAN:  Jill Alexanders, M.D.   CHIEF COMPLAINTS:  Dyspnea.   HISTORY OF PRESENT ILLNESS:  Mrs. Reggio is a 55 year old woman with  history of heavy tobacco abuse and bronchitis who presented to the  emergency room today after about 4 or 5 days of upper respiratory  symptoms followed by sore throat and coughing and now shortness of  breath.  The patient also is producing a greenish sputum.  She denies  any frank chest pain.   PAST MEDICAL HISTORY:  1. Obesity.  2. Hypertension.  3. Gastroesophageal reflux disease with recent Nissen procedure done      in 1995.  4. Pulmonary nodules in 2007.  5. Total hysterectomy and bilateral salpingo-oophorectomy.   MEDICATIONS:  None.   ALLERGIES:  NONE.   SOCIAL HISTORY:  Works at [employer].  She is divorced.  She has two  grown up children.  Does not drink any alcohol.   FAMILY HISTORY:  The patient's mother died with MI at age 27.  Father  died with an MI at age 27.  Grandmother with lung cancer.  Mother,  father and brother with hypertension.   REVIEW OF SYSTEMS:  As per HPI, all other symptoms systems reviewed and  negative.   PHYSICAL EXAM:  Upon admission indicates temperature of 100.4, blood  pressure 136/84, pulse 91, respirations 20, saturation 99 to zero% on  room air.  GENERAL APPEARANCE:  Obese woman in no acute distress sitting on the  stretcher, alert, oriented to place, person and time.  HEAD:  Normocephalic, atraumatic.  EYES:  Pupils equal, round and reactive to light and accommodation.  NOSE:  Intact.  THROAT:  Clear.  NECK: Supple.  No JVD.  CHEST:   Bilateral rhonchi.  Minimal wheezes and no crackles.  HEART: Regular rate and rhythm without murmurs, rubs or gallop.  The patient's abdomen is obese and soft, nontender.  Bowel sounds are  present.  LOWER EXTREMITIES: Without edema.  SKIN:  Warm, dry without any suspicious rashes.  The patient has  xanthelasma of both eyes.   LABORATORY VALUES:  On admission white blood cell count is 5.2,  hemoglobin 15.6, platelet count is 126.  Sodium 137, potassium 4,  chloride 99, bicarbonate 34, BUN 5, creatinine 0.7, glucose 145.  Chest  x-ray shows bilateral lower lobe infiltrate and cardiomegaly.   ASSESSMENT/PLAN:  45. A 55 year old woman tobacco abuser with history of reactive airways      presenting with a bilateral pneumonia, hypoxia and wheezing.  2. We will admit the patient to the hospital.  3. We will place her on intravenous empiric antibiotics for community-      acquired pneumonia  using Rocephin and Zithromax.  4. Bronchodilators will be used as needed.  5. Since the patient is a heavy tobacco user and there was question of      suspicious pulmonary nodules we will repeat the CT scan without      intravenous contrast to follow up on the possibility of lung      cancer.  6. Deep vein thrombosis prophylaxis and gastrointestinal prophylaxis      will be done in the usual fashion.      Edythe Lynn, M.D.  Electronically Signed     SL/MEDQ  D:  06/06/2007  T:  06/07/2007  Job:  [id number]   cc:   Jill Alexanders, M.D.

## 2010-08-20 NOTE — H&P (Signed)
[place]  Patient:    [name], [name]                    MRN: [id number] Adm. Date:  [id number] Attending:  Lajean Saver CC:         Farrel Gordon, M.D. (1 copy to his mailbox, another             Candace Gallus, M.D., [place]                         History and Physical  DATE OF BIRTH:  [birth year]  PROBLEM LIST: 1. Chest pressure, rule out myocardial infarction versus other etiologies. 2. History of hypertension.  No medications for the last six months.    History of left ventricular hypertrophy, mild by 2-D echocardiogram in    February 1999 with normal ejection fraction. 3. Heavy tobacco abuse, one and one-half to two packs per day for at least    20 years. 4. History of gastroesophageal reflux disease and hiatal hernia status post Nissen    fundoplication in 9379. 5. Obesity. 6. Chronic obstructive pulmonary disease with bilateral bibasilar scarring by    chest x-ray. 7. History of intermittent lower extremity edema in April 1999. 8. Total abdominal hysterectomy with bilateral salpingo-oophorectomy about eight    months ago on hormonal replacement therapy.  CHIEF COMPLAINT:  Chest pressure and shortness of breath.  HISTORY OF PRESENT ILLNESS:  [name] is a very pleasant 55 year old white female who woke up this morning at about 3 a.m. with substernal chest tightness.  The chest pain did not radiate to the neck, arms, or shoulders.  She describes some  shortness of breath with this chest tightness.  She denies nausea, vomiting, diaphoresis.  When she got up this morning, ambulation or physical activity did not affect her chest pressure.  She did not go to work and decided to come to the emergency department for further evaluation.   Her chest tightness improved and  then resolved after the third sublingual nitroglycerin.  She denies any recent upper respiratory infection, fever,  chills, cough, lower extremity edema.  She denies any previous history of diabetes.  She does not know her lipid status. he has been taking hormonal replacement therapy for at least 8 months after her hysterectomy.  She smokes as describes in Problem List, and she has a strong family medical history for early coronary artery disease as will be described in Family Medical History section.  The patient denies any previous anginal symptoms. She was at her usual health status until this morning.  She denies any recent heartburn.  She is not very active.  The patient denies any previous history of  similar chest symptoms.  PAST MEDICAL HISTORY:   As Problem List.  ALLERGIES:  None.  MEDICATIONS: 1. Premarin 0.625 mg p.o. q.d. 2. Albuterol MDI 2 puffs p.r.n.  FAMILY MEDICAL HISTORY:  The patients mother had her first MI at the age of 57 nd died due to an MI at the age of 80.  Her father had an MI at around 30 years old and died at age 41 from an acute MI.  Otherwise, there is no diabetes in the family.  Her grandmother had lung cancer.  Her mother, father, and brother had hypertension.  No history of strokes in the family.  SOCIAL HISTORY:  The patient is divorced.  She has two grown children.  She smokes as described in problem list.  She drinks occasionally.  She works in [employer].  REVIEW OF SYSTEMS:  As HPI.  The patient denies recent cough, fever, chills, nausea, vomiting, diarrhea, constipation, melena, tarry stools, bright red blood per rectum, focal weakness, swallowing problems, visual changes, skin rash, lower extremity edema, joint stiffness, or synovitis.  PHYSICAL EXAMINATION:  VITAL SIGNS:  Temperature 98.7, blood pressure 148/88, heart rate 105, respirations 22, oxygen saturation 95% on room air.  HEENT:  Normocephalic, atraumatic.  Nonicteric sclerae.  PERRLA.  EOMI. Funduscopic exam negative for papilledema or hemorrhages.  Mucous  membranes moist. Oropharynx clear.  Conjunctivae within normal limits.  NECK:  Supple.  No JVD, no bruits, no adenopathy, no thyromegaly.  LUNGS:  Clear to auscultation except for some occasional rales at the bases. No crackles, no wheezing.  Fair to poor air movement bilaterally.  CARDIAC:  Distant heart sounds.  Regular rate and rhythm.  No S3, no rubs.  I am unable to appreciate any murmurs.  BREASTS:  Within normal limits.  GU:  Within normal limits.  ABDOMEN:  Slightly obese, nontender, nondistended.  Bowel sounds were present. No hepatosplenomegaly.  No rebound, no guarding, no bruits, no masses.  RECTAL:  Normal sphincter tone, empty vault.  EXTREMITIES:  No edema, clubbing, or cyanosis.  Pulses 1+ bilaterally.  NEUROLOGIC:  Alert and oriented x 3.  Cranial nerves II-XII intact.  Strength 5/5 in all extremities.  DTRs 3/5 in all extremities.  Sensory intact.  Plantar reflexes downgoing bilaterally.  LABORATORY DATA:  EKG: Normal sinus rhythm with normal axis.  No ST changes. Normal R wave progression, ventricular rate of 93.  There is a T wave inversion in V1, otherwise negative EKG.  Troponin I less than 0.3, CK 50, MB 0.8.  Hemoglobin 14.9, MCV 89, WBC 7.5, no eft shift, platelet 181.  Sodium 138, potassium 3.7, chloride 99, CO2 29, BUN 8, creatinine 8.9, glucose 141.  LFTs within normal limits except alkaline phosphatase at 124 and albumin 3.4.  Initial chest x-ray consistent with cardiomegaly and possible CHF.  Repeat chest x-ray PA and lateral shows no evidence of CHF, no evidence of infiltrates. Cardiomegaly is present.  Bilateral lower lobe chronic scarring also appreciated in 1996.  ASSESSMENT/PLAN: 1. Chest pressure, rule out myocardial infarction.  The presentation of these chest    symptoms are somewhat atypical given the lack of history of anginal symptoms    with exercise.  Also to be noted, the symptoms persisted constantly for about     4-1/2 hours.  Despite the length of symptoms, the EKG and the cardiac enzymes    are negative so far.  The patients cardiac risk factors include a very strong     family medical history for early coronary artery disease, heavy smoking,    hypertension currently untreated, and obesity.  It is unknown to [place] the lipid    status of this patient.  She denies diabetes.  Currently the patient is chest    pain free.  The repeated chest x-ray revealed chronic pulmonary changes and o    congestive heart failure.  I discussed at length this case with Dr. Pernell Dupre,    [facility], who agrees with admitting [name] to a telemetry bed to [facility]    out MI.  If the EKG and cardiac enzymes series were to be negative, the patient    will be ambulated tomorrow on telemetry.  If no recurrent symptoms, Dr. Pernell Dupre recommended to discharge the patient with cardiac medications.  She will    return to Dr. Kyung Bacca office on Monday, June 14, 1999, to have an    adenosine Cardiolite.  If the cardiac enzymes or the EKG were to become    positive, the patient will remain in [facility] to undergo cardiac    catheterization on Monday.  In the meantime, we will use aspirin, nitroglycerin    drip, a beta blocker agent, and oxygen supplementation.  Also, Lasix will be    provided.  The BMET will be rechecked in the morning to rule out hypokalemia due    to Lasix. 2. Hypertension.  On admission, the patients blood pressure was slightly    increased.  The patient has a history of left ventricular hypertrophy noted y    echocardiogram in April 1999.  We will go ahead and resume the patients ACE    inhibitor and add Lasix as described in Problem #1.  A beta blocker agent also    could be upon discharge if the patients blood pressure and heart rate allow.  We will be monitoring the patients blood pressure throughout this hospital    stay. 3. Smoking.  I spent about 15 minutes talking about  the importance of smoking    cessation.  The patient will be started on nicotine patch. 4. History of gastroesophageal reflux disease.  The chest symptoms that the    patient is presenting with could be associated with reflux.  We will go ahead    and start the patient on PPI and follow the response.  Notice that esophageal    spasm and GERD tend to respond to nitrate therapy.  This could explain why the    patients substernal chest pressure got relief with nitroglycerin.    Nevertheless, the patient is being admitted to a telemetry bed to rule out    cardiac source of chest pain. DD:  06/11/99 TD:  06/11/99 Job: [id number]

## 2010-08-20 NOTE — Discharge Summary (Signed)
NAME:  [name], [name]                  ACCOUNT NO.:  [id number]   MEDICAL RECORD NO.:  [id number]          PATIENT TYPE:  INP   LOCATION:  6729                         FACILITY:  [place]   PHYSICIAN:  Doree Albee, M.D.DATE OF BIRTH:  1955/08/26   DATE OF ADMISSION:  06/06/2007  DATE OF DISCHARGE:  06/12/2007                               DISCHARGE SUMMARY   FINAL DISCHARGE DIAGNOSES:  1. Chronic obstructive pulmonary disease with type 2 respiratory      failure.  2. Pneumonia, improved.  3. Hypertension.  4. Hyperlipidemia.  5. Tobacco abuse.   CONDITION ON DISCHARGE:  Stable.   MEDICATIONS ON DISCHARGE:  1. Avelox 400 mg daily for 2 further days.  2. Zocor 20 mg daily, nightly.  3. Combivent inhaler 2 puffs four times a day.  4. Pulmicort inhaler 180 mcg 1 puff b.i.d.  5. Humibid 600 mg 2 tablets a day for 7 days.   HISTORY:  This 55 year old lady was admitted with 4-5 days history of  upper respiratory symptoms followed by sore throat and coughing and now  shortness of breath.  The patient has a cough productive of greenish  sputum.   Please see initial history and physical examination done by Dr. Edythe Lynn.   HOSPITAL PROGRESS:  The patient was admitted and started intravenous  antibiotics and nebulizers.  She underwent CT of the chest, which showed  a ground-glass opacities, which were consistent with probable pneumonia  bilaterally.  There was a small pericardial effusion and a small  pulmonary nodule.  She improved on the treatment described above  gradually.  She also underwent 2-D echocardiogram, which was largely  unrevealing.  Her main issue seemed to be one of  type 2 respiratory  failure.  In fact, the echocardiogram showed ejection fraction of 60-65%  with normal BNP.  On the day of discharge, she described her breathing  was much improved and it was felt that she would require home oxygen.  Unfortunately, the patient was unable to have home oxygen because  she  heated her house with an oak flame and that she was not able to make any  other arrangements.  She still wanted to go home despite concerns about  needing home oxygen.  She has been requested to followup with her  primary care physician.      Doree Albee, M.D.  Electronically Signed    NCG/MEDQ  D:  07/16/2007  T:  07/17/2007  Job:  [id number]

## 2010-08-20 NOTE — Consult Note (Signed)
[name], [name] NO.:  [id number]   MEDICAL RECORD NO.:  [id number]          PATIENT TYPE:  EMS   LOCATION:  [facility]JO                         FACILITY:  [facility]   PHYSICIAN:  Melrose Nakayama, MD  DATE OF BIRTH:  1955-06-11   DATE OF CONSULTATION:  DATE OF DISCHARGE:  08/11/2007                                 CONSULTATION   REQUESTING PHYSICIAN:  Milton Ferguson, MD   REASON FOR CONSULTATION:  Right long finger infection.   BRIEF HISTORY:  The patient is a 56-year right-hand-dominant female who  noticed a slow onset of pain and swelling in her right hand.  She does  not recall any specific trauma or any bites to the hand.  The patient  presented to the emergency department with a worsening redness and pain  to the right long finger.  I was consulted for the management of her  right long finger.  The concern was for an abscess in the dorsum of the  long finger.   She denies any fevers, chills, nausea, vomiting, or any other  constitutional symptoms.   PAST MEDICAL HISTORY:  Chronic bronchitis.   SOCIAL HISTORY:  She is a smoker.  No alcohol or drug use.   FAMILY HISTORY:  Coronary artery disease.  Both mother and father died  from heart attack.   She lives here in [place].   MEDICATIONS:  BC powder as needed.   ALLERGIES:  DIFLUCAN.   REVIEW OF SYSTEMS:  No recent illnesses or hospitalizations.   PHYSICAL EXAMINATION:  GENERAL:  She is a healthy-appearing female.  She  is alert and oriented to person, place, and time,  in no acute distress.  VITAL SIGNS:  Temperature 97.5, blood pressure 165/94, heart rate of 76,  and respirations 18.  EXTREMITIES:  On examination of the right hand, she does have some  swelling and fluctuance over the dorsal aspect of the right long finger.  Slight redness over the dorsum of the hand.  No ascending lymphangitis.  She has no evidence of flexor tenosynovitis.  She is able to make the OK  sign, cross her fingers,  abduct and adduct the digits.  Her fingertips  are warm, well perfused,  good capillary refill.  Good sensation.  She  does not have any other lesions to her index, ring, or small finger.  The abscess collection is over the dorsum of the long finger at the  level of proximal phalanx.   RADIOGRAPHY:  Two views of the hand did show the soft tissue swelling at  the long finger, did not see any evidence of fracture or foreign body.   IMPRESSION:  Right long finger abscess.   PROCEDURE:  After the patient was seen and examined, we talked about the  focal collection of the infection directly over the dorsum of the long  finger.  After the patient was counseled,  we elected to proceed with an  incision and drainage of the simple abscess over the dorsum of the long  finger.  A digital block was then performed with lidocaine 10  mL.  The  finger was then prepped with Betadine and sterilely draped.  After  adequate anesthesia, a small longitudinal incision was made centered  directly over the abscess where gross purulent material was then  encountered.  This was cultured.  The wound with the abscess area was  then evacuated and debridement was then carried out of the abscess  cavity.  The abscess was then thoroughly irrigated.  It was then packed  with iodoform gauze.  A sterile compressive dressing was then applied.  The patient was then placed in a finger splint to immobilize the digit.  The patient tolerated the procedure well.   IMPRESSION:  Right long finger abscess, status post incision and  drainage.   PLAN:  The patient is going to be discharged to home.  She will be sent  out on oral antibiotics, doxycycline 100 mg twice a day.  Pain  medications will be given.  She will be instructed to come back to the  office within 48 hours for wound check, packing removal, and likely  begin outpatient wound care.  Prior to discharge, the patient's  discharge instructions were explained to her and  all questions were  answered.  The patient voiced understanding.   DISCHARGE INSTRUCTIONS:  She is to keep the bandage on at all times.  All questions were addressed today.      Melrose Nakayama, MD  Electronically Signed     FWO/MEDQ  D:  08/13/2007  T:  08/14/2007  Job:  [id number]

## 2010-09-02 ENCOUNTER — Ambulatory Visit (HOSPITAL_COMMUNITY)
Admission: RE | Admit: 2010-09-02 | Discharge: 2010-09-02 | Disposition: A | Discharge status: Home / Self Care | Payer: No Typology Code available for payment source | Source: Ambulatory Visit | Attending: Internal Medicine | Admitting: Internal Medicine

## 2010-09-02 DIAGNOSIS — Z1231 Encounter for screening mammogram for malignant neoplasm of breast: Secondary | ICD-10-CM

## 2010-09-07 ENCOUNTER — Emergency Department (HOSPITAL_COMMUNITY): Payer: Self-pay

## 2010-09-07 ENCOUNTER — Observation Stay (HOSPITAL_COMMUNITY)
Admission: EM | Admit: 2010-09-07 | Discharge: 2010-09-08 | Disposition: A | Discharge status: Home / Self Care | Payer: Self-pay | Attending: Internal Medicine | Admitting: Internal Medicine

## 2010-09-07 ENCOUNTER — Encounter: Payer: Self-pay | Admitting: Internal Medicine

## 2010-09-07 DIAGNOSIS — R079 Chest pain, unspecified: Secondary | ICD-10-CM | POA: Insufficient documentation

## 2010-09-07 DIAGNOSIS — R0609 Other forms of dyspnea: Principal | ICD-10-CM | POA: Insufficient documentation

## 2010-09-07 DIAGNOSIS — J449 Chronic obstructive pulmonary disease, unspecified: Secondary | ICD-10-CM | POA: Insufficient documentation

## 2010-09-07 DIAGNOSIS — F17200 Nicotine dependence, unspecified, uncomplicated: Secondary | ICD-10-CM | POA: Insufficient documentation

## 2010-09-07 DIAGNOSIS — J441 Chronic obstructive pulmonary disease with (acute) exacerbation: Secondary | ICD-10-CM

## 2010-09-07 DIAGNOSIS — E785 Hyperlipidemia, unspecified: Secondary | ICD-10-CM | POA: Insufficient documentation

## 2010-09-07 DIAGNOSIS — J4489 Other specified chronic obstructive pulmonary disease: Secondary | ICD-10-CM | POA: Insufficient documentation

## 2010-09-07 DIAGNOSIS — R0989 Other specified symptoms and signs involving the circulatory and respiratory systems: Principal | ICD-10-CM | POA: Insufficient documentation

## 2010-09-07 DIAGNOSIS — K219 Gastro-esophageal reflux disease without esophagitis: Secondary | ICD-10-CM | POA: Insufficient documentation

## 2010-09-07 DIAGNOSIS — R0602 Shortness of breath: Secondary | ICD-10-CM

## 2010-09-07 LAB — D-DIMER, QUANTITATIVE: D-Dimer, Quant: 0.54 ug{FEU}/mL — ABNORMAL HIGH (ref 0.00–0.48)

## 2010-09-07 LAB — CBC
HCT: 40.9 % (ref 36.0–46.0)
Hemoglobin: 13.5 g/dL (ref 12.0–15.0)
MCH: 30.8 pg (ref 26.0–34.0)
MCHC: 33 g/dL (ref 30.0–36.0)
MCV: 93.2 fL (ref 78.0–100.0)
Platelets: 143 K/uL — ABNORMAL LOW (ref 150–400)
RBC: 4.39 MIL/uL (ref 3.87–5.11)
RDW: 13.4 % (ref 11.5–15.5)
WBC: 7.3 10*3/uL (ref 4.0–10.5)

## 2010-09-07 LAB — DIFFERENTIAL
Basophils Absolute: 0 10*3/uL (ref 0.0–0.1)
Basophils Relative: 0 % (ref 0–1)
Eosinophils Absolute: 0.3 K/uL (ref 0.0–0.7)
Eosinophils Relative: 3 % (ref 0–5)
Lymphocytes Relative: 31 % (ref 12–46)
Lymphs Abs: 2.2 K/uL (ref 0.7–4.0)
Monocytes Absolute: 0.7 K/uL (ref 0.1–1.0)
Monocytes Relative: 9 % (ref 3–12)
Neutro Abs: 4.1 10*3/uL (ref 1.7–7.7)
Neutrophils Relative %: 57 % (ref 43–77)

## 2010-09-07 LAB — COMPREHENSIVE METABOLIC PANEL WITH GFR
Albumin: 3.7 g/dL (ref 3.5–5.2)
BUN: 10 mg/dL (ref 6–23)
Calcium: 9 mg/dL (ref 8.4–10.5)
Glucose, Bld: 89 mg/dL (ref 70–99)
Total Protein: 6.8 g/dL (ref 6.0–8.3)

## 2010-09-07 LAB — CK TOTAL AND CKMB (NOT AT ARMC)
CK, MB: 1.5 ng/mL (ref 0.3–4.0)
Relative Index: INVALID (ref 0.0–2.5)
Total CK: 31 U/L (ref 7–177)

## 2010-09-07 LAB — PROTIME-INR
INR: 1.06 (ref 0.00–1.49)
Prothrombin Time: 14 seconds (ref 11.6–15.2)

## 2010-09-07 LAB — COMPREHENSIVE METABOLIC PANEL
ALT: 11 U/L (ref 0–35)
AST: 13 U/L (ref 0–37)
Alkaline Phosphatase: 104 U/L (ref 39–117)
CO2: 39 mEq/L — ABNORMAL HIGH (ref 19–32)
Chloride: 100 mEq/L (ref 96–112)
Creatinine, Ser: 0.61 mg/dL (ref 0.4–1.2)
GFR calc Af Amer: >60 mL/min (ref >60)
GFR calc non Af Amer: >60 mL/min (ref >60)
Potassium: 4.6 mEq/L (ref 3.5–5.1)
Sodium: 143 mEq/L (ref 135–145)
Total Bilirubin: 0.5 mg/dL (ref 0.3–1.2)

## 2010-09-07 LAB — TROPONIN I: Troponin I: <0.3 ng/mL (ref <0.30)

## 2010-09-07 LAB — APTT: aPTT: 34 seconds (ref 24–37)

## 2010-09-07 LAB — PRO B NATRIURETIC PEPTIDE: Pro B Natriuretic peptide (BNP): 233 pg/mL — ABNORMAL HIGH (ref 0–125)

## 2010-09-07 MED ORDER — IOHEXOL 300 MG/ML  SOLN
100.0000 mL | Freq: Once | INTRAMUSCULAR | Status: AC | PRN
  Administered 2010-09-07: 100 mL via INTRAVENOUS

## 2010-09-07 NOTE — Progress Notes (Signed)
Hospital Admission Note Date: 09/07/2010  Patient name: [name] Medical record number: [id number] Date of birth: Jan 24, 1956 Age: 55 y.o. Gender: female PCP: Ludwig Lean, MD  Medical Service: IMTS  Attending physician: Dr. Lane Hacker   Resident [phone]): Dr. Vanessa Kick   Pager: [phone] Resident ([place]): Dr. Silverio Decamp    Pager: [phone]  Chief Complaint: chest discomfort  History of Present Illness: Pt is a  55 year old woman with a h/o of CAD s/p PCI w/ LAD DES 12/2008,  COPD on home 02, HTN, a family history of early myocardial infarction in her mother and her father, ongoing tobacco abuse and medication noncompliance 2/2 financial constraints who presenting with chest discomfort. Patient noted sudden onset of right sided chest discomfort late last night. Describes the discomfort as a dull ache, radiates to her right scapula, and intensity does not vary with position or exertion. It has been persistent since onset and not associated with n/v/diaphoresis, palpitations, sob, fever or chills. Of note, she denies any recent cough, sore throat, nasal congestion, or other symptoms suggestive of URI. In the ED, her chest discomfort did not change after Nitro administration. She was admitted to the IM service for ACS rule out given her h/o and new EKG changes.   Of note, following her PCI with stenting in September 2010, she was instructed to continue Plavix and aspirin with close Cardiology follow-up. However, secondary to financial constraints, she has been off all prescription medicines, except for aspirin, including her Plavix, pravastatin, and inhalers until late March when she started taking her BB, statin and H2 blocker.  She also has not followed up with Cardiology as instructed.  Furthermore, she admits to continued tobacco abuse.   No current facility-administered medications for this visit.   Current Outpatient Prescriptions  Medication Sig Dispense Refill  . acetaminophen (TYLENOL)  325 MG tablet Take 650 mg by mouth every 6 (six) hours as needed.        Marland Kitchen albuterol (PROVENTIL) (2.5 MG/3ML) 0.083% nebulizer solution Take 2.5 mg by nebulization every 6 (six) hours as needed.        Marland Kitchen aspirin 81 MG tablet Take 81 mg by mouth daily.        . carvedilol (COREG) 3.125 MG tablet Take 3.125 mg by mouth 2 (two) times daily with a meal.        . furosemide (LASIX) 20 MG tablet Take 20 mg by mouth daily. As needed for lower extremity edema       . ibuprofen (ADVIL,MOTRIN) 400 MG tablet Take 400 mg by mouth every 8 (eight) hours as needed.        Marland Kitchen ipratropium (ATROVENT) 0.02 % nebulizer solution Take 500 mcg by nebulization 4 (four) times daily.        . pravastatin (PRAVACHOL) 40 MG tablet Take 40 mg by mouth daily.        . ranitidine (ZANTAC) 150 MG tablet Take 150 mg by mouth 2 (two) times daily.        Marland Kitchen UNABLE TO FIND Med Name:Oxygen 4L [place]        Facility-Administered Medications Ordered in Other Visits  Medication Dose Route Frequency Provider Last Rate Last Dose  . iohexol (OMNIPAQUE) 300 MG/ML injection 100 mL  100 mL Intravenous Once PRN Medication Radiologist   100 mL at 09/07/10 2237    Allergies: Fluconazole  Past Medical History  Diagnosis Date  . Coronary artery disease     S/P PCI of LAD with DES (12/2008). Total occlusion  of RCA noted at that time., medically managed. ACS ruled out 03/2009 with Lexiscan myoview . Followed by [facility].  . Pulmonary hypertension     2-D Echo (12/8117) - Systolic pressure was moderately increased. PA peak pressure  61mHg. 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 Followed by Dr. WJoya Gaskins(Vel[facility] Heckler  . Diastolic dysfunction     2-D Echo (12/2008) - Normal LV Systolic funciton with EF 60-65%. Grade 1 diastolid dysfunction. No regional wall motion abnormalities. Moderate pulmonary HTN with PA peak pressure 539mg.  . Marland KitchenOPD (chronic  obstructive pulmonary disease)     Severe. Gold Stage IV.  PFTs (12/2008) - severe obstructive airway disease. Active tobacco use. Requires 4L O2 at home.  . Pulmonary nodule, right     Small right middle lobe nodule. Stable as of 12/2008.  . Marland Kitchenleep apnea     Presumptive. No documented sleep studies.   . Prediabetes 12/2008    HgbA1c 6.4 (12/2008)  . Hx MRSA infection     Recurrent MRSA thigh abscesses.  . Tobacco abuse     Ongoing.  . Obesity   . Hyperlipidemia   . GERD (gastroesophageal reflux disease)     S/P Nissen fundoplication.    Past Surgical History  Procedure Date  . Total abdominal hysterectomy w/ bilateral salpingoophorectomy   . Nissen fundoplication     Family History  Problem Relation Age of Onset  . Heart disease Mother 4723  Deceased from MI at 56yo. Hypertension Mother   . Heart disease Father 5424  Deceased of MI age 289yo. Hypertension Father   . Hypertension Brother   . Lung cancer      Grandmother    History   Social History  . Marital Status: Single    Spouse Name: N/A    Number of Children: N/A  . Years of Education: N/A   Occupational History  . Not on file.   Social History Main Topics  . Smoking status: Current Everyday Smoker -- 0.5 packs/day    Types: Cigarettes  . Smokeless tobacco: Never Used  . Alcohol Use: Yes     occasional  . Drug Use: No  . Sexually Active: Not on file   Other Topics Concern  . Not on file   Social History Narrative   Formerly worked as a caScientist, water qualitynow disabled.Divorced.2 grown children.Lives with her grandson.  Smokes 1ppd and has been doing so for >3579yr Review of Systems: Pertinent items are noted in HPI.  Physical Exam: Vitals: t 98.1, p 86, rr 16, bp 154/74, 02 sat 93% 4L General appearance: alert, cooperative and no distress Head: Normocephalic, without obvious abnormality, atraumatic Throat: dry mucosal membranes Neck: no carotid bruit, no JVD and supple, symmetrical, trachea  midline Lungs: clear to auscultation bilaterally Heart: regular rate and rhythm, S1, S2 normal, no murmur, click, rub or gallop Abdomen: soft, non-tender; bowel sounds normal; no masses,  no organomegaly Extremities: extremities normal, atraumatic, no cyanosis or edema Pulses: 2+ and symmetric Neurologic: Grossly normal   Lab results: CBC:    Component Value Date/Time   WBC 7.3 09/07/2010 1946   HGB 13.5 09/07/2010 1946   HCT 40.9 09/07/2010 1946   PLT 143* 09/07/2010 1946   MCV 93.2 09/07/2010 1946   NEUTROABS 4.1 09/07/2010 1946   LYMPHSABS 2.2 09/07/2010 1946   MONOABS 0.7 09/07/2010 1946   EOSABS  0.3 09/07/2010 1946   BASOSABS 0.0 09/07/2010 1946    Comprehensive Metabolic Panel:    Component Value Date/Time   NA 143 09/07/2010 1946   K 4.6 09/07/2010 1946   CL 100 09/07/2010 1946   CO2 39* 09/07/2010 1946   BUN 10 09/07/2010 1946   CREATININE 0.61 09/07/2010 1946   CREATININE 0.73 06/15/2010 0920   GLUCOSE 89 09/07/2010 1946   CALCIUM 9.0 09/07/2010 1946   AST 13 09/07/2010 1946   ALT 11 09/07/2010 1946   ALKPHOS 104 09/07/2010 1946   BILITOT 0.5 09/07/2010 1946   PROT 6.8 09/07/2010 1946   ALBUMIN 3.7 09/07/2010 1946   D-Dimer, Fibrin Derivatives              0.54       h      0.00-0.48        ug/mL-FEU Beta Natriuretic Peptide                 233.0      h      0-125            Pg/mL  Creatine Kinase, Total                   31                7-177            U/L CK, MB                                   1.5               0.3-4.0          ng/mL Relative Index                           SEE NOTE.         0.0-2.5 Troponin I                               <0.30             <0.30            Ng/mL  Imaging results:   CHEST - 2 VIEW    Comparison: Chest x-ray 06/01/2010.    Findings: The heart is enlarged but stable.  The mediastinal and   hilar contours are slightly prominent but unchanged.  Chronic   bronchitic type lung changes but no acute pulmonary infiltrates or   effusions.  Streaky bibasilar  atelectasis is noted.    IMPRESSION:    1.  Stable cardiac enlargement.   2.  Chronic bronchitic type lung changes and streaky areas of   atelectasis.   CT ANGIOGRAPHY CHEST WITH CONTRAST    Technique:  Multidetector CT imaging of the chest was performed   using the standard protocol during bolus administration of   intravenous contrast.  Multiplanar CT image reconstructions   including MIPs were obtained to evaluate the vascular anatomy.    Contrast:  100 ml Omnipaque 300    Comparison:  12/13/2008    Findings:  Technically adequate study with good opacification of   the central and segmental pulmonary arteries.  No focal filling   defects.  No evidence of significant pulmonary embolus.  Normal   caliber  thoracic aorta with mild calcification.  Calcification in   the coronary arteries.  No significant lymphadenopathy in the   chest.  No pleural effusions.  Surgical clips in the EG junction.   Visualization of the lung fields is limited due to respiratory   motion artifact but there appears to be interstitial fibrosis in   the lung bases with linear atelectasis in the middle lungs   bilaterally.  No focal consolidation.    Review of the MIP images confirms the above findings.    IMPRESSION:   No evidence of significant pulmonary embolus.  Assessment & Plan by Problem: 55 y/o female with PMHx of CAD, COPD, HLD, tobacco abuse and family history of early MI, non compliant with her plavix presenting with chest discomfort.  1) Chest discomfort - Differential for this patient with significant cardiac risk factors includes ACS (although CE neg x1 but with new EKG changes). GERD exacerbation also a consideration given past severity of GERD requiring fundoplication without continued PPI therapy or GI follow-up in the past.  No evidence of infiltrate, PTX, widened mediastinum per CXR. PE ruled out with negative CTA. No radiographic or clinical indications for volume overload 2/2 CHF  exacerbation. - Will admit to tele for overnight observation  - Will cycle cardiac enzymes and EKGs.  - Check UDS - Consider inpatient cardiology consult - as patient has been lost to cardiology follow-up with [facility] for > 1 year 2/2 financial constraints. Additionally, cardiology input regarding restarting Plavix and/or high dose aspirin > 1 year after placement of DES will aide management.  - Continue medical management with aspirin 81 mg, statin, BB, smoking cessation.   2) COPD - At baseline, no wheezing or evidence of respiratory distress to suggest acute COPD exacerbation. CXR negative for infiltrative process.  - Continue home inhalers  - Continue home O2 at 4L by [place]  - Social work consult for smoking cessation counseling.   3) Tobacco abuse - ongoing tobacco abuse 1/2 ppd x 40 years.  - SW consult for smoking cessation  - declines nicotine patch  4) GERD -  - Resume home Ranitidine 164m BID  5) HLD  - check FLP - Resume home pravastatin at 40 mg daily, which may require further outpatient adjustment   6) DVT ppx - Heparin

## 2010-09-08 DIAGNOSIS — R079 Chest pain, unspecified: Secondary | ICD-10-CM

## 2010-09-08 LAB — URINALYSIS, ROUTINE W REFLEX MICROSCOPIC
Ketones, ur: NEGATIVE mg/dL
Nitrite: NEGATIVE
Protein, ur: NEGATIVE mg/dL
Specific Gravity, Urine: >1.046 — ABNORMAL HIGH (ref 1.005–1.030)
Urobilinogen, UA: 1 mg/dL (ref 0.0–1.0)

## 2010-09-08 LAB — BASIC METABOLIC PANEL
CO2: 34 mEq/L — ABNORMAL HIGH (ref 19–32)
Calcium: 9.3 mg/dL (ref 8.4–10.5)
Chloride: 99 mEq/L (ref 96–112)
Creatinine, Ser: 0.48 mg/dL (ref 0.4–1.2)
Glucose, Bld: 147 mg/dL — ABNORMAL HIGH (ref 70–99)

## 2010-09-08 LAB — LIPID PANEL
Total CHOL/HDL Ratio: 4.7 RATIO
VLDL: 18 mg/dL (ref 0–40)

## 2010-09-08 LAB — CARDIAC PANEL(CRET KIN+CKTOT+MB+TROPI)
CK, MB: 1.6 ng/mL (ref 0.3–4.0)
Relative Index: INVALID (ref 0.0–2.5)
Relative Index: INVALID (ref 0.0–2.5)
Total CK: 26 U/L (ref 7–177)
Troponin I: <0.3 ng/mL (ref <0.30)
Troponin I: <0.3 ng/mL (ref <0.30)

## 2010-09-08 LAB — CBC
MCH: 30.9 pg (ref 26.0–34.0)
MCHC: 33.9 g/dL (ref 30.0–36.0)
RDW: 13.2 % (ref 11.5–15.5)

## 2010-09-09 LAB — URINE CULTURE

## 2010-09-10 NOTE — Consult Note (Signed)
  NAMESHAUNIE, BOEHM NO.:  [id number]  MEDICAL RECORD NO.:  [id number]  LOCATION:  6580                         FACILITY:  [facility]  PHYSICIAN:  Carlena Bjornstad, MD, FACCDATE OF BIRTH:  [birth year]  DATE OF CONSULTATION: DATE OF DISCHARGE:                                CONSULTATION   HISTORY OF PRESENT ILLNESS:  The patient has been admitted with chest pain.  She is ready to go home and we have been asked to see if she can be cleared by Cardiology to go home for outpatient cardiology followup. The patient does have known coronary disease with a stent in 2010.  Her pain at that time was significant and in the center of her chest.  She was admitted to the hospital at this time with some vague right chest pain.  Her cardiac enzymes have been negative.  Her EKG has not shown any significant change.  ALLERGIES:  FLUCONAZOLE.  MEDICATIONS:  Albuterol, aspirin, carvedilol, furosemide, ibuprofen, Atrovent, Pravachol, and Zantac.  OTHER MEDICAL PROBLEMS:  See the list below.  FAMILY HISTORY:  There is a family history of coronary disease.  SOCIAL HISTORY:  The patient does continue to smoke.  REVIEW OF SYSTEMS:  Today, she denies fever, chills, headache, sweats, rash, change in vision, change in hearing, chest pain, cough, nausea, vomiting, or urinary symptoms.  All other systems are reviewed and are negative.  PHYSICAL EXAMINATION:  GENERAL:  The patient is overweight.  She is stable.  She has vague mild right chest discomfort that is not cardiac. The patient is oriented to person, time, and place.  Affect is normal. VITAL SIGNS:  Blood pressure is 147/92. HEAD:  Atraumatic. NECK:  There is no jugular venous distention. LUNGS:  A few scattered rhonchi.  There is no respiratory distress. CARDIAC:  S1 with an S2.  There are no clicks or significant murmurs. ABDOMEN:  Soft.  There is no significant peripheral edema.  LABORATORY DATA:  Troponins are completely  normal during this hospitalization.  EKGs reveal nonspecific ST-T wave changes.  Chest x- ray had shown no acute changes.  PROBLEMS: 1. History of coronary disease, stable at this time.  The patient had     a stent in 2010.  She is on appropriate medications. 2. History of pulmonary hypertension by echo in the past. 3. Evidence of diastolic dysfunction historically. 4. History of a pulmonary nodule, stable over time. 5. Tobacco abuse. 6. Obesity. 7. Hyperlipidemia. 8. GERD. 9. Current right chest pain.  I believe that this chest pain is     nonspecific.  I believe that is not cardiac in origin.  The patient is stable and she can be discharged home.  She should have a followup appointment with Dr. Percival Spanish who can then reassess her status and decide what type if any followup cardiac testing is needed.     Carlena Bjornstad, MD, [facility]     JDK/MEDQ  D:  09/08/2010  T:  09/08/2010  Job:  [id number]  Electronically Signed by Dola Argyle MD [place] on 09/10/2010 05:46:28 PM

## 2010-09-14 ENCOUNTER — Other Ambulatory Visit: Payer: Self-pay | Admitting: Internal Medicine

## 2010-09-14 DIAGNOSIS — R928 Other abnormal and inconclusive findings on diagnostic imaging of breast: Secondary | ICD-10-CM

## 2010-09-14 LAB — CULTURE, BLOOD (ROUTINE X 2)
Culture  Setup Time: 201206060820
Culture: NO GROWTH

## 2010-09-24 ENCOUNTER — Encounter: Payer: Self-pay | Admitting: Physician Assistant

## 2010-10-20 NOTE — Discharge Summary (Signed)
[name], [name] NO.:  [id number]  MEDICAL RECORD NO.:  [id number]  LOCATION:  6578                         FACILITY:  [facility]  PHYSICIAN:  [name], D.O.DATE OF BIRTH:  05/11/1955  DATE OF ADMISSION:  09/07/2010 DATE OF DISCHARGE:  09/08/2010                              DISCHARGE SUMMARY   DISCHARGE DIAGNOSES: 1. Chest discomfort that the likely secondary to musculoskeletal or     gastroesophageal reflux disease. 2. Chronic obstructive pulmonary disease. 3. Tobacco abuse. 4. Gastroesophageal reflux disease. 5. Hyperlipidemia.  DISCHARGE MEDICATIONS: 1. Bactroban 2% ointment apply to the nostril b.i.d. 2. Atrovent 0.5 nebulizer q.6 h. p.r.n. 3. Coreg 3.125 mg p.o. b.i.d. 4. Pravastatin 40 mg p.o. daily. 5. Ranitidine 150 mg p.o. b.i.d. 6. Flexeril 5 mg p.o. t.i.d. x2 weeks. 7. Aspirin 81 mg p.o. daily. 8. Meloxicam 50 mg p.o. daily. 9. Albuterol 2.5 mg nebulizer q.6 h. p.r.n.  DISPOSITION AND FOLLOWUP:  [name] was discharged from [facility] on September 08, 2010, in stable and improved condition.  [name] still has mild right chest discomfort,but  no nausea, vomiting, or shortness of breath.  Her condition is likely secondary to musculoskeletal versus GERD.  Likely noncardiac in origin.  The patient was seen and evaluated by [name] with [facility] and he recommend outpatient followup with [name]  The patient will have an appointment with [name], [place] on September 24, 2010 at 11 a.m.  [name] will also need to make an appointment with her primary care physician, [name] in 1-2 weeks for follow up.  At that time, he will need to 1. Reassess her fever as [name] did have one episode of elevated     temperature of 101.4, unclear etiology; however, her chest x-ray was clear,     urinalysis was also did not show any leukocytes or nitrite. 2. Reevaluate her right chest discomfort after 2 weeks of muscle      relaxant.  CONSULTATION:  Consultation, [place] cardiology with [name]  PROCEDURE PERFORMED: 1. Chest x-ray on September 07, 2010, shows stable cardiac enlargement.     Chronic lymphatic-type lung changes and streaky area of     atelectasis. 2. CT angiogram of chest on September 07, 2010, show no evidence of     significant pulmonary embolus.  ADMISSION HISTORY:  [name] is a 55 year old woman with a history of CAD, status post PCI with LAD/DES in September 2010, COPD on home oxygen, hypertension, family history of early myocardial infarction in her mother and father, ongoing tobacco abuse, and medication compliance secondary to financial constraints who presented with right chest discomfort.  The patient noted sudden onset of right-sided chest discomfort the night prior to admission.  [name] described the discomfort as a dull ache, radiates to her right scapula and intensity does not vary with position or exertion.  It has been consistent since the onset and has not associated with nausea, vomiting or diaphoresis or palpitation or shortness of breath, fever or chills.  Of note, [name] denies any recent cough, sore throat, nasal congestion, or other symptoms suggestive of URI.  In the ED, her chest  discomfort did not change after nitrite administration.  [name] was admitted to [facility] Service for ACS rule out given her history and new EKG changes.  Of note, following her PCI with stenting in September 2010, [name] was instructed to continue Plavix and aspirin with close Cardiology followup.  However, secondary to financial constraints, [name] has been off of her prescription medications except for aspirin.  [name] has not been taking her Plavix, pravastatin, and held them until late March when [name] started taking her beta-blocker, statin, and H2 blocker.  [name] also has not follow up with Cardiology as instructed. Furthermore, [name]  continues tobacco abuse.  PHYSICAL EXAMINATION ON ADMISSION:  VITAL SIGNS:   Temperature 98.1, pulse 86, blood pressure 154/74, respiration 16, O2 sat 93% on 4 liters nasal cannula. GENERAL APPEARANCE:  Alert and cooperative to examination.  No acute distress. HEENT:  Head:  Normocephalic, atraumatic.  Throat:  Dry mucosal membranes. NECK:  No carotid bruit.  No JVD, supple, symmetrical.  Trachea midline. LUNGS:  Clear to auscultation bilaterally.  No wheezes or crackles noted. HEART:  Regular rate and rhythm.  S1-S2 normal.  No murmur, click, rub, or gallop. ABDOMEN:  Soft, nontender, AND nondistended.  Normal bowel sounds, no mass, no organomegaly. EXTREMITIES:  [name] is in orthopedic boot, [name] is status post fall, in which [name] broke her left ankle and right leg, no cyanosis, mild edema on lower extremity.  Nontender.  Pulse +2 and symmetric. NEUROLOGIC:  Grossly normal.  ADMISSION LABS:  WBC 7.3, hemoglobin 13.5, hematocrit 40.9, platelet 145, MCV 93.2, absolute neutrophil 4.1.  Sodium 143, potassium 4.6, chloride 100, bicarb 39, BUN 10, creatinine 0.61, glucose 89.  Calcium 9, AST 13, ALT 11, alk phos 104, total bili 0.5, total protein 6.8, albumin 3.7, D-dimer 0.54.  BNP 233.  CK total of 33, CK-MB 1.5, troponin less than 0.030.  EKG reviews T-wave inversion on leads I, II, III, IV, normal sinus rhythm with heart rate of 63.  PR interval 164, QRS 86, and no ST elevation or depression.  HOSPITAL COURSE: 1. Right chest discomfort.  This is likely secondary to     musculoskeletal versus gastroesophageal reflux disease; however,     the patient had a significant cardiac risk factor including acute     coronary syndrome and new EKG changes, the patient was admitted for     further evaluation and workup.  The patient has no evidence of     infiltrates, pneumothorax, widened mediastinum per chest x-ray.     Pulmonary embolism was rule out with a negative CTA.  No     radiographic or clinical indication for volume overload secondary     to congestive heart  failure exacerbation.  [name] had EKG changes which shows T-wave inversion on V1-4 therefore, Cardiology was consulted     for possible Myoview; however, [name] evaluate the patient and     recommended that the patient can have follow up with [name]     as outpatient for further evaluation.  This current episode was     unlikely cardiac in origin.  We cycled her cardiac enzymes x3, which     were all negative.  The patient repeatedly states that [name] wanted     to go home and will agree to follow up as outpatient.  Even at     discharge, the patient still continued to have a right chest     discomfort, which [name] does not describe as pain.  The  patient will     need to discuss the option of restarting her Plavix or a high-dose     aspirin.  During this hospital course, we continued with medical     management with aspirin 81 mg, statin, beta-blocker and continued     counseling on smoking cessation.  The patient will have a followup     appointment with [name] on September 24, 2010.  [name]     was also sent home with a 2 weeks course of Flexeril 5 mg p.o.     t.i.d. to alleviate her muscle spasms.  The patient had one     elevated temperature of 101.4 of unknown etiology.  Chest x-ray was     clear, UA did not shows any leukocytes or nitrites.  [name] will need     to be follow up as outpatient if [name] continues to spike     temperature.   2. Chronic obstructive pulmonary disease.  The patient is at baseline     in which [name] uses 4-5 liters of nasal cannula at home.  No wheezing     or evidence of respiratory stress to suggest acute chronic     obstructive pulmonary disease exacerbation.  Chest x-ray negative     for infiltrate process.  We continue her home inhaler and 4 liters     of nasal cannula of O2. 3. Ongoing tobacco abuse, have a pack per day x40 years.  We consulted     a social work for smoking cessation.  The patient declines nicotine     patch at this time. 4.  Gastroesophageal reflux disease.  We will resume home ranitidine     150 mg t.i.d. 5. Hyperlipidemia.  Recheck her fasting lipid panel with a cholesterol     of 191, triglyceride 91, LDL 132, HDL 41.  We will resume her home     pravastatin 40 mg daily.  It is suggested that her PCP adjust her     statin during her outpatient appointment. 6. Deep venous thrombosis prophylaxis.  [name] received heparin 5000     units subcu t.i.d.  DISCHARGE VITAL SIGNS:  Temperature 97.3, blood pressure 150/102, respirations 20, pulse 82, O2 sat 90% on 4 liters of nasal cannula.  Her baseline is 88-92% on 4 liters at home.  DISCHARGE LABS:  Urinalysis shows small bilirubin, but negative nitrites and negative leukocytes.  Cardiac enzymes x3 were negative.  BMP; sodium 142, potassium 4.5, chloride 99, bicarb 34, BUN 13, creatinine 0.48, glucose 146.  CBC; WBC 7.6, hemoglobin 14.6, hematocrit 43.1, platelet 155.    ______________________________ Julius Bowels, MD   ______________________________ [name], D.O.    MH/MEDQ  D:  09/08/2010  T:  09/09/2010  Job:  [id number]  cc:   Minus Breeding, MD, [facility] Ludwig Lean, MD  Electronically Signed by Julius Bowels MD on 09/21/2010 01:59:14 PM Electronically Signed by Lane Hacker D.O. on 10/20/2010 08:57:59 AM

## 2010-12-04 ENCOUNTER — Other Ambulatory Visit: Payer: Self-pay | Admitting: Internal Medicine

## 2010-12-07 NOTE — Telephone Encounter (Signed)
Refills approved.  Please schedule a follow-up appointment with patient's PCP.

## 2010-12-07 NOTE — Telephone Encounter (Signed)
Message sent to front desk for an appt. 

## 2010-12-16 ENCOUNTER — Encounter: Payer: No Typology Code available for payment source | Admitting: Internal Medicine

## 2010-12-24 ENCOUNTER — Ambulatory Visit: Payer: Self-pay | Admitting: Licensed Clinical Social Worker

## 2010-12-24 ENCOUNTER — Ambulatory Visit (INDEPENDENT_AMBULATORY_CARE_PROVIDER_SITE_OTHER): Payer: Self-pay | Admitting: Internal Medicine

## 2010-12-24 ENCOUNTER — Encounter: Payer: Self-pay | Admitting: Internal Medicine

## 2010-12-24 ENCOUNTER — Other Ambulatory Visit: Payer: Self-pay | Admitting: Licensed Clinical Social Worker

## 2010-12-24 VITALS — BP 139/89 | HR 66 | Temp 98.7°F | Ht 65.0 in

## 2010-12-24 DIAGNOSIS — E785 Hyperlipidemia, unspecified: Secondary | ICD-10-CM

## 2010-12-24 DIAGNOSIS — K219 Gastro-esophageal reflux disease without esophagitis: Secondary | ICD-10-CM

## 2010-12-24 DIAGNOSIS — Z598 Other problems related to housing and economic circumstances: Secondary | ICD-10-CM

## 2010-12-24 DIAGNOSIS — F329 Major depressive disorder, single episode, unspecified: Secondary | ICD-10-CM | POA: Insufficient documentation

## 2010-12-24 DIAGNOSIS — Z599 Problem related to housing and economic circumstances, unspecified: Secondary | ICD-10-CM

## 2010-12-24 DIAGNOSIS — J449 Chronic obstructive pulmonary disease, unspecified: Secondary | ICD-10-CM

## 2010-12-24 DIAGNOSIS — I2510 Atherosclerotic heart disease of native coronary artery without angina pectoris: Secondary | ICD-10-CM

## 2010-12-24 MED ORDER — IPRATROPIUM BROMIDE 0.02 % IN SOLN: 500.0000 ug | Freq: Four times a day (QID) | RESPIRATORY_TRACT | Status: DC

## 2010-12-24 MED ORDER — FLUOXETINE HCL 20 MG PO CAPS: 20.0000 mg | ORAL_CAPSULE | Freq: Every day | ORAL | Status: DC

## 2010-12-24 MED ORDER — ALBUTEROL SULFATE (2.5 MG/3ML) 0.083% IN NEBU: 2.5000 mg | INHALATION_SOLUTION | Freq: Four times a day (QID) | RESPIRATORY_TRACT | Status: DC | PRN

## 2010-12-24 MED ORDER — ASPIRIN 81 MG PO TABS: 81.0000 mg | ORAL_TABLET | Freq: Every day | ORAL | Status: DC

## 2010-12-24 MED ORDER — ACETAMINOPHEN 325 MG PO TABS: 650.0000 mg | ORAL_TABLET | Freq: Four times a day (QID) | ORAL | Status: DC | PRN

## 2010-12-24 MED ORDER — PRAVASTATIN SODIUM 40 MG PO TABS: 40.0000 mg | ORAL_TABLET | Freq: Every day | ORAL | Status: DC

## 2010-12-24 MED ORDER — RANITIDINE HCL 150 MG PO TABS: 150.0000 mg | ORAL_TABLET | Freq: Two times a day (BID) | ORAL | Status: DC

## 2010-12-24 NOTE — Assessment & Plan Note (Signed)
Pain is likely 2/2 GERD that has been untreated. I would rather we do once daily PPI than H2 blocker, but financial constraints will not allow this. [name] will restart ranitidine.

## 2010-12-24 NOTE — Patient Instructions (Signed)
Return to clinic to see Dr. Nance Pew on 01/25/11. Please bring all your medications to your next clinic appointment.     What is this medicine? FLUOXETINE (floo OX e teen) belongs to a class of drugs known as selective serotonin reuptake inhibitors (SSRIs). It helps to treat mood problems such as depression, obsessive compulsive disorder, and panic attacks. It can also treat certain eating disorders. This medicine may be used for other purposes; ask your health care provider or pharmacist if you have questions.   What should I tell my health care provider before I take this medicine? They need to know if you have any of these conditions: -bipolar disorder or mania -diabetes -glaucoma -liver disease -psychosis -seizures -suicidal thoughts or history of attempted suicide -an unusual or allergic reaction to fluoxetine, other medicines, foods, dyes, or preservatives -pregnant or trying to get pregnant -breast-feeding   How should I use this medicine? Take this medicine by mouth with a glass of water. Follow the directions on the prescription label. You can take this medicine with or without food. Take your medicine at regular intervals. Do not take it more often than directed. Do not stop taking except on your doctor's advice.   A special MedGuide will be given to you by the pharmacist with each prescription and refill. Be sure to read this information carefully each time.   Talk to your pediatrician regarding the use of this medicine in children. While this drug may be prescribed for children as young as 7 years for selected conditions, precautions do apply.   Overdosage: If you think you have taken too much of this medicine contact a poison control center or emergency room at once. NOTE: This medicine is only for you. Do not share this medicine with others.   What if I miss a dose? If you miss a dose, skip the missed dose and go back to your regular dosing schedule. Do not take double  or extra doses.   What may interact with this medicine? Do not take fluoxetine with any of the following medications: -other medicines containing fluoxetine, like Sarafem or Symbyax -certain diet drugs like dexfenfluramine, fenfluramine, phentermine -cisapride -linezolid -medicines called MAO Inhibitors like Azilect, Carbex, Eldepryl, Marplan, Nardil, and Parnate -methylene blue -pimozide -procarbazine -thioridazine -tryptophan Fluoxetine may also interact with the following medications: -alcohol -any other medicines for depression, anxiety, or psychotic disturbances -aspirin and aspirin-like medicines -carbamazepine -cyproheptadine -dextromethorphan -flecainide -lithium -medicines for diabetes -medicines for migraine headache, like sumatriptan -medicines for sleep -medicines that treat or prevent blood clots like warfarin, enoxaparin, and dalteparin -metoprolol -NSAIDs, medicines for pain and inflammation, like ibuprofen or naproxen -phenytoin -propafenone -propranolol -St. John's wort -vinblastine This list may not describe all possible interactions. Give your health care provider a list of all the medicines, herbs, non-prescription drugs, or dietary supplements you use. Also tell them if you smoke, drink alcohol, or use illegal drugs. Some items may interact with your medicine.   What should I watch for while using this medicine? Visit your doctor or health care professional for regular checks on your progress. Continue to take your medicine even if you do not immediately feel better. It can take several weeks before you notice the full effect of this medicine.   Patients and their families should watch out for worsening depression or thoughts of suicide. Also watch out for any sudden or severe changes in feelings such as feeling anxious, agitated, panicky, irritable, hostile, aggressive, impulsive, severely restless, overly excited and hyperactive, or  not being able to  sleep. If this happens, especially at the beginning of treatment or after a change in dose, call your doctor.   You may get drowsy or dizzy. Do not drive, use machinery, or do anything that needs mental alertness until you know how this medicine affects you. Do not stand or sit up quickly, especially if you are an older patient. This reduces the risk of dizzy or fainting spells. Alcohol can make you more drowsy and dizzy. Avoid alcoholic drinks.   Your mouth may get dry. Chewing sugarless gum or sucking hard candy, and drinking plenty of water may help. Contact your doctor if the problem does not go away or is severe.   If you have diabetes, this medicine may affect blood sugar levels. Check your blood sugar. Talk to your doctor or health care professional if you notice changes.   If you have been taking this medicine regularly for some time, do not suddenly stop taking it. You must gradually reduce the dose or you may get side effects or have a worsening of your condition. Ask your doctor or health care professional for advice.   Do not treat yourself for coughs, colds or allergies without asking your doctor or health care professional for advice. Some ingredients can increase possible side effects.   What side effects may I notice from receiving this medicine? Side effects that you should report to your doctor or health care professional as soon as possible: -allergic reactions like skin rash, itching or hives, swelling of the face, lips, or tongue -breathing problems -confusion -fast or irregular heart rate, palpitations -flu-like fever, chills, cough, muscle or joint aches and pains -seizures -suicidal thoughts or other mood changes -tremors -trouble sleeping -unusual bleeding or bruising -unusually tired or weak -vomiting   Side effects that usually do not require medical attention (report to your doctor or health care professional if they continue or are bothersome): -blurred  vision -change in sex drive or performance -diarrhea -dry mouth -flushing -headache -increased or decreased appetite -nausea -sweating   This list may not describe all possible side effects. Call your doctor for medical advice about side effects. You may report side effects to FDA at 1-800-FDA-1088.   Where should I keep my medicine? Keep out of the reach of children.   Store at room temperature between 15 and 30 degrees C (59 and 86 degrees F). Throw away any unused medicine after the expiration date.   NOTE:This sheet is a summary. It may not cover all possible information. If you have questions about this medicine, talk to your doctor, pharmacist, or health care provider.      2011, [employer].

## 2010-12-24 NOTE — Assessment & Plan Note (Signed)
This is stable. I would like to see [name] on an inhaled steroid, but her financial situation will not allow it at this time. We will continue her albuterol and ipratropium nebs. I told her she could increase the frequency of ipratropium if she needed to.

## 2010-12-24 NOTE — Assessment & Plan Note (Signed)
She is getting paperwork together for [employer] [facility] [employer] our financial counselor.  I told her it is very important that she follow through, so we can get her any additional help we are able. I also put her in touch with Victory Dakin our social worker, who will work with Ms. [place] to get rides, food assistance and possibly other resources.

## 2010-12-24 NOTE — Assessment & Plan Note (Signed)
[name] needs to be on daily ASA, as it will absolutely decrease mortality.  She does not want to take beta blocker due to dizziness, which if discontinuing this medication gets her to take her other medications, I am okay with this. It too would decrease mortality, which I explained to Morgan Par today.  She is willing to restart ASA and pravastatin.

## 2010-12-24 NOTE — Assessment & Plan Note (Signed)
Given feeling of depression and hopeless, combined with the self-destructive behavior of discontinuing all meds, I am very concerned for [name]  She is really going through a lot medically, financially, and socially.  I referred her to [employer] our social worker, so that she can get more support. We will start Fluoxetine today.  I will have her follow up with Dr. Nance Pew in 1 month.

## 2010-12-24 NOTE — Progress Notes (Signed)
Subjective:   Patient ID: [name] female   DOB: Aug 15, 1955 56 y.o.   MRN: [id number] PI:000  [name] is a 55 y.o. woman PMH, CAD status post drug-eluting stent 2000 and, COPD on 4 L O2, GERD. She was recently hospitalized on the night of June 5 for chest pain rule out that was thought to be due to GERD. She presents today for followup of her GERD and COPD.  [name] states that she is feeling hopeless and has stopped taking her medications since Sept 1. She states that one of her medicines (she thinks it was Coreg) was making her dizzy and having falls. She states that she was getting dizzy whenever she transferred from laying to sitting or from sitting to standing. She states it was so bad, she just didn't want to deal with it anymore.  She is also getting overwhelmed with her home situation. Her 2 grandchildren (boy 18, and girl 4), live with her and she feels overwhelmed by their problems. She describes her grandson as being a "violent boy," but he has never been violent with her. [name] admits to feeling on the verge of depression. She has decreased sleep from baseline. She only gets 4-6 hours per night with many night time awakenings.  She does endorse a feeling of hopelessness.  She denies lack of interest, guilt, change in apatite, loss of concentration, psychomotor agitation, or suicidal ideation.  [name] has only needed her nebulizer treatments ~ BID   She has had an increase in her right sided chest pain since stopping ranitidine. She states the pain is a achy pain that is in her right chest and radiates to her back. It is unchanged with food. She states that this medicine releived her pain after her hospitalization and that it feels just like her GERD pain. Denies SOB, pain with activity, diaphoresis, or DOE above baseline COPD.        Past Medical History  Diagnosis Date  . Coronary artery disease     S/P PCI of LAD with DES (12/2008). Total occlusion of RCA noted at  that time., medically managed. ACS ruled out 03/2009 with Lexiscan myoview . Followed by [facility].  . Pulmonary hypertension     2-D Echo (61/9509) - Systolic pressure was moderately increased. PA peak pressure  20mHg. 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 Followed by Dr. WJoya Gaskins(Vel[facility] Heckler  . Diastolic dysfunction     2-D Echo (12/2008) - Normal LV Systolic funciton with EF 60-65%. Grade 1 diastolid dysfunction. No regional wall motion abnormalities. Moderate pulmonary HTN with PA peak pressure 561mg.  . Marland KitchenOPD (chronic obstructive pulmonary disease)     Severe. Gold Stage IV.  PFTs (12/2008) - severe obstructive airway disease. Active tobacco use. Requires 4L O2 at home.  . Pulmonary nodule, right     Small right middle lobe nodule. Stable as of 12/2008.  . Marland Kitchenleep apnea     Presumptive. No documented sleep studies.   . Prediabetes 12/2008    HgbA1c 6.4 (12/2008)  . Hx MRSA infection     Recurrent MRSA thigh abscesses.  . Tobacco abuse     Ongoing.  . Obesity   . Hyperlipidemia   . GERD (gastroesophageal reflux disease)     S/P Nissen fundoplication.   Current Outpatient Prescriptions  Medication Sig Dispense Refill  . albuterol (PROVENTIL) (2.5 MG/3ML) 0.083% nebulizer solution  Take 2.5 mg by nebulization every 6 (six) hours as needed.  75 mL  6  . ipratropium (ATROVENT) 0.02 % nebulizer solution Take 2.5 mLs (500 mcg total) by nebulization 4 (four) times daily.  75 mL  6  . UNABLE TO FIND Med Name:Oxygen 4L [place]       . acetaminophen (TYLENOL) 325 MG tablet Take 2 tablets (650 mg total) by mouth every 6 (six) hours as needed.  30 tablet  6  . aspirin 81 MG tablet Take 1 tablet (81 mg total) by mouth daily.  30 tablet  9  . carvedilol (COREG) 3.125 MG tablet TAKE ONE TABLET BY MOUTH TWICE DAILY  60 tablet  3  . FLUoxetine (PROZAC) 20 MG capsule Take 1 capsule (20 mg total) by mouth  daily.  30 capsule  2  . furosemide (LASIX) 20 MG tablet Take 20 mg by mouth daily. As needed for lower extremity edema       . pravastatin (PRAVACHOL) 40 MG tablet Take 1 tablet (40 mg total) by mouth daily.  30 tablet  6  . ranitidine (ZANTAC) 150 MG tablet Take 1 tablet (150 mg total) by mouth 2 (two) times daily.  60 tablet  6   Family History  Problem Relation Age of Onset  . Heart disease Mother 38    Deceased from MI at 18yo  . Hypertension Mother   . Heart disease Father 23    Deceased of MI age 75yo  . Hypertension Father   . Hypertension Brother   . Lung cancer      Grandmother   History   Social History  . Marital Status: Single    Spouse Name: N/A    Number of Children: N/A  . Years of Education: N/A   Social History Main Topics  . Smoking status: Current Everyday Smoker -- 0.5 packs/day    Types: Cigarettes  . Smokeless tobacco: Never Used  . Alcohol Use: Yes     occasional  . Drug Use: No  . Sexually Active: None   Other Topics Concern  . None   Social History Narrative   Formerly worked as a Scientist, water quality, now disabled.Divorced.2 grown children.Lives with her grandson.   Review of Systems: Constitutional: Denies fever, chills, diaphoresis, appetite change and Endorses fatigue.  HEENT: Denies  congestion, sore throat, rhinorrhea,  Respiratory: Denies SOB, DOE, cough. Endorses occasional chest tightness, and wheezing.   Cardiovascular: Denies chest pain, palpitations and leg swelling.  Gastrointestinal: Denies nausea, vomiting, Endorses abdominal pain.  Musculoskeletal: Denies myalgias, back pain, joint swelling, arthralgias and gait problem.  Psychiatric/Behavioral: Denies suicidal ideation, Endorses depressed mood   Objective:  Physical Exam: Filed Vitals:   12/24/10 0946  BP: 139/89  Pulse: 66  Temp: 98.7 F (37.1 C)  TempSrc: Oral  Height: _0  (1.651 m)   Constitutional: Vital signs reviewed.  Patient is an obese woman in no acute distress and  cooperative with exam. She is on o2 [place] today Head: Normocephalic and atraumatic Mouth: no erythema or exudates, MMM. Upper dentures present Eyes: PERRL, EOMI, conjunctivae normal, No scleral icterus.  Neck: Supple, Trachea midline normal ROM, No JVD Cardiovascular: RRR, S1 normal, S2 normal, no MRG, pulses symmetric and intact bilaterally. 1+ edema to the upper shins bilaterally Pulmonary/Chest: CTAB, no wheezes, rales, or rhonchi Abdominal: Soft. non-distended, bowel sounds are normal, no masses, organomegaly, or guarding present. There is mild TTP in the epigastrium Musculoskeletal: No joint deformities, erythema, or stiffness, ROM full and no  nontender Neurological: A&O x3, cranial nerve II-XII are grossly intact, no focal motor deficit, sensory intact to light touch bilaterally.  Skin: Warm, dry and intact. No rash, cyanosis, or clubbing.  Psychiatric: Depressed mood. Tearful during history. speech and behavior is normal. Judgment and thought content normal. Cognition and memory are normal.

## 2010-12-24 NOTE — Assessment & Plan Note (Signed)
LDL is above goal of <100, but given financial stress and social stress, I will hold off aggressive management for now.  If we can figure out how to pay for it,  I would like to start atorvastatin

## 2010-12-27 LAB — BLOOD GAS, ARTERIAL
Acid-Base Excess: 11.7 — ABNORMAL HIGH
Bicarbonate: 37.4 — ABNORMAL HIGH
O2 Saturation: 86.7
Patient temperature: 97.7
TCO2: 39.4
pH, Arterial: 7.381

## 2010-12-27 LAB — BASIC METABOLIC PANEL
BUN: 14
CO2: 34 — ABNORMAL HIGH
Chloride: 93 — ABNORMAL LOW
Chloride: 99
GFR calc Af Amer: >60
GFR calc non Af Amer: >60
Glucose, Bld: 155 — ABNORMAL HIGH
Potassium: 4
Potassium: 4.2
Sodium: 139

## 2010-12-27 LAB — CBC
HCT: 46.4 — ABNORMAL HIGH
HCT: 46.7 — ABNORMAL HIGH
HCT: 48.3 — ABNORMAL HIGH
Hemoglobin: 16 — ABNORMAL HIGH
Hemoglobin: 16.4 — ABNORMAL HIGH
MCHC: 33.7
MCHC: 33.9
MCV: 90.8
MCV: 91
MCV: 91.1
MCV: 91.2
Platelets: 119 — ABNORMAL LOW
Platelets: 126 — ABNORMAL LOW
RBC: 5.09
RDW: 13.6
RDW: 13.8
RDW: 14.4
WBC: 5.2

## 2010-12-27 LAB — DIFFERENTIAL
Eosinophils Absolute: 0
Eosinophils Relative: 1
Lymphocytes Relative: 17
Lymphs Abs: 0.9
Monocytes Relative: 15 — ABNORMAL HIGH
Neutrophils Relative %: 67

## 2010-12-27 LAB — COMPREHENSIVE METABOLIC PANEL
ALT: 19
ALT: 25
AST: 22
Albumin: 3.5
Alkaline Phosphatase: 88
BUN: 16
CO2: 32
Calcium: 8.9
Chloride: 95 — ABNORMAL LOW
Creatinine, Ser: 0.64
GFR calc Af Amer: >60
GFR calc non Af Amer: >60
GFR calc non Af Amer: >60
Glucose, Bld: 105 — ABNORMAL HIGH
Potassium: 3.8
Sodium: 138
Total Bilirubin: 0.5
Total Protein: 6.4
Total Protein: 6.9

## 2010-12-27 LAB — CULTURE, RESPIRATORY W GRAM STAIN: Culture: NORMAL

## 2010-12-27 LAB — RAPID STREP SCREEN (MED CTR MEBANE ONLY): Streptococcus, Group A Screen (Direct): NEGATIVE

## 2010-12-27 LAB — B-NATRIURETIC PEPTIDE (CONVERTED LAB): Pro B Natriuretic peptide (BNP): 54

## 2010-12-27 LAB — LIPID PANEL
Cholesterol: 210 — ABNORMAL HIGH
HDL: 27 — ABNORMAL LOW
Total CHOL/HDL Ratio: 7.8
Triglycerides: 97

## 2010-12-27 LAB — EXPECTORATED SPUTUM ASSESSMENT W GRAM STAIN, RFLX TO RESP C

## 2010-12-27 LAB — HEMOGLOBIN A1C: Mean Plasma Glucose: 129

## 2010-12-27 NOTE — Progress Notes (Signed)
Patient seen and examined. I agree with Dr. Pecola Leisure assessment and plan. I will see her back in my clinic in 4-5 weeks.

## 2010-12-27 NOTE — Progress Notes (Signed)
Patient was referred to Soc. Work by Dr. Rosine Door due to depression, financial difficulties, and issues of medication compliance. The patient's health issues include COPD, CAD, HTN, GERD, OSA. The patient is known to social work due to a previous referral in March where I spoke to the patient at length about her situation.  (Please see previous phone note 3/12).  Saw pt in exam room today.   The patient reported continued problems w/ transportation as her car is still not working.   Medicaid/Medicare:  The patient will be eligible for Medicare next year.  She is overincome for Medicaid.   Social Supports:  The patient takes care of her two grandchildren ages 33 and 13.  She reports difficulties w/ the 56 yo in that he does not wish to go on to college and is not working.  Her adult daughter is in the picture but struggling financially as well.   The patient lives w/ a roommate and they share expenses.     Financial Eligibility:  The patient still has not f/u with [name].  She was asked to do this back in March.  She tells me she is trying to get the information together.   The patient's SSD income is around 1,200, and her rent is just under $600 per month.  She does not get Foodstamps.     Church:  The patient said she is not connected w/ any church.   Depression:  I offered to provide information about counseling services and the patient declined at this time saying she would see how things go.  Urged her to connect w/ counseling before a major crisis came about.  PCP prescribed anti-depressant medication.   A/P:  55 yo female patient overwhelmed by health issues, primary caregiving of grandchildren, and limited resources.   1)  Handout given which explains how to access [employer].   2)  [employer] information given/  Encouraged her to call for appmt.   3)  Urged to certify with [employer] [facility]atus and explained to her that this would open some doors for her in terms of discounts and  resources.   4)  PCP and SW will continue to encourage MH counseling.

## 2010-12-30 LAB — CULTURE, ROUTINE-ABSCESS

## 2011-01-17 LAB — POCT CARDIAC MARKERS
CKMB, poc: <1 — ABNORMAL LOW
Myoglobin, poc: 40
Operator id: 277751
Troponin i, poc: <0.05

## 2011-01-17 LAB — DIFFERENTIAL
Eosinophils Absolute: 0.3
Lymphocytes Relative: 35
Lymphs Abs: 2.9
Neutrophils Relative %: 52

## 2011-01-17 LAB — CBC
Hemoglobin: 15.6 — ABNORMAL HIGH
MCHC: 33.6
MCV: 90.4
RBC: 5.15 — ABNORMAL HIGH

## 2011-01-17 LAB — COMPREHENSIVE METABOLIC PANEL
CO2: 28
Calcium: 9
Creatinine, Ser: 0.77
GFR calc non Af Amer: >60
Glucose, Bld: 86

## 2011-01-17 LAB — I-STAT 8, (EC8 V) (CONVERTED LAB)
Acid-Base Excess: 4 — ABNORMAL HIGH
BUN: 5 — ABNORMAL LOW
Bicarbonate: 31.2 — ABNORMAL HIGH
Chloride: 107
HCT: 52 — ABNORMAL HIGH
Hemoglobin: 17.7 — ABNORMAL HIGH
Operator id: 288831
Sodium: 141

## 2011-01-17 LAB — POCT I-STAT CREATININE: Creatinine, Ser: 0.9

## 2011-01-25 ENCOUNTER — Ambulatory Visit: Payer: Self-pay | Admitting: Internal Medicine

## 2011-04-24 IMAGING — CR DG CHEST 2V
2 series · 2 of 2 positions shown · non-contrast
Comparison: 06/12/2007.

CLINICAL DATA: Shortness of breath.

CHEST - 2 VIEW

[w chest pa]
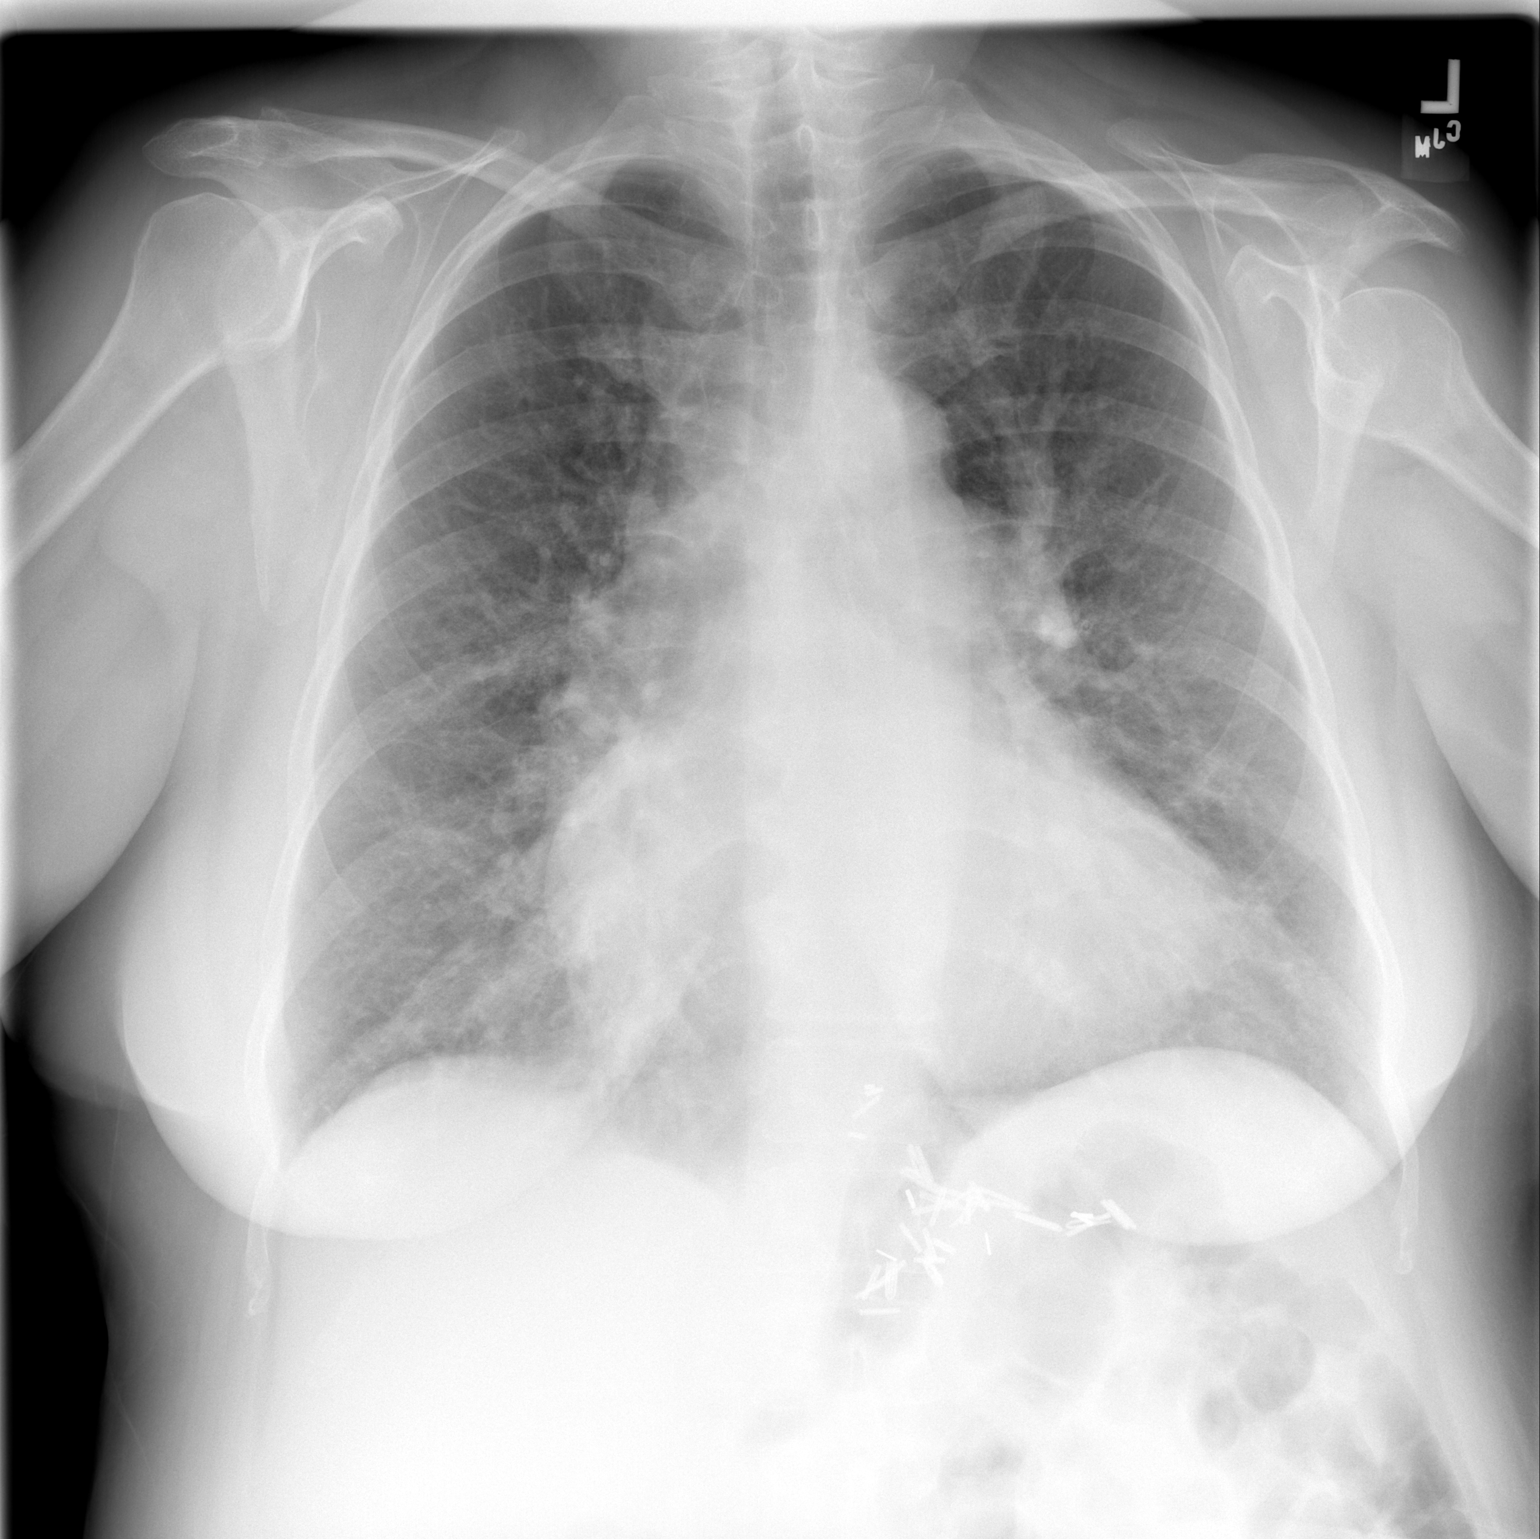

[w chest lat]
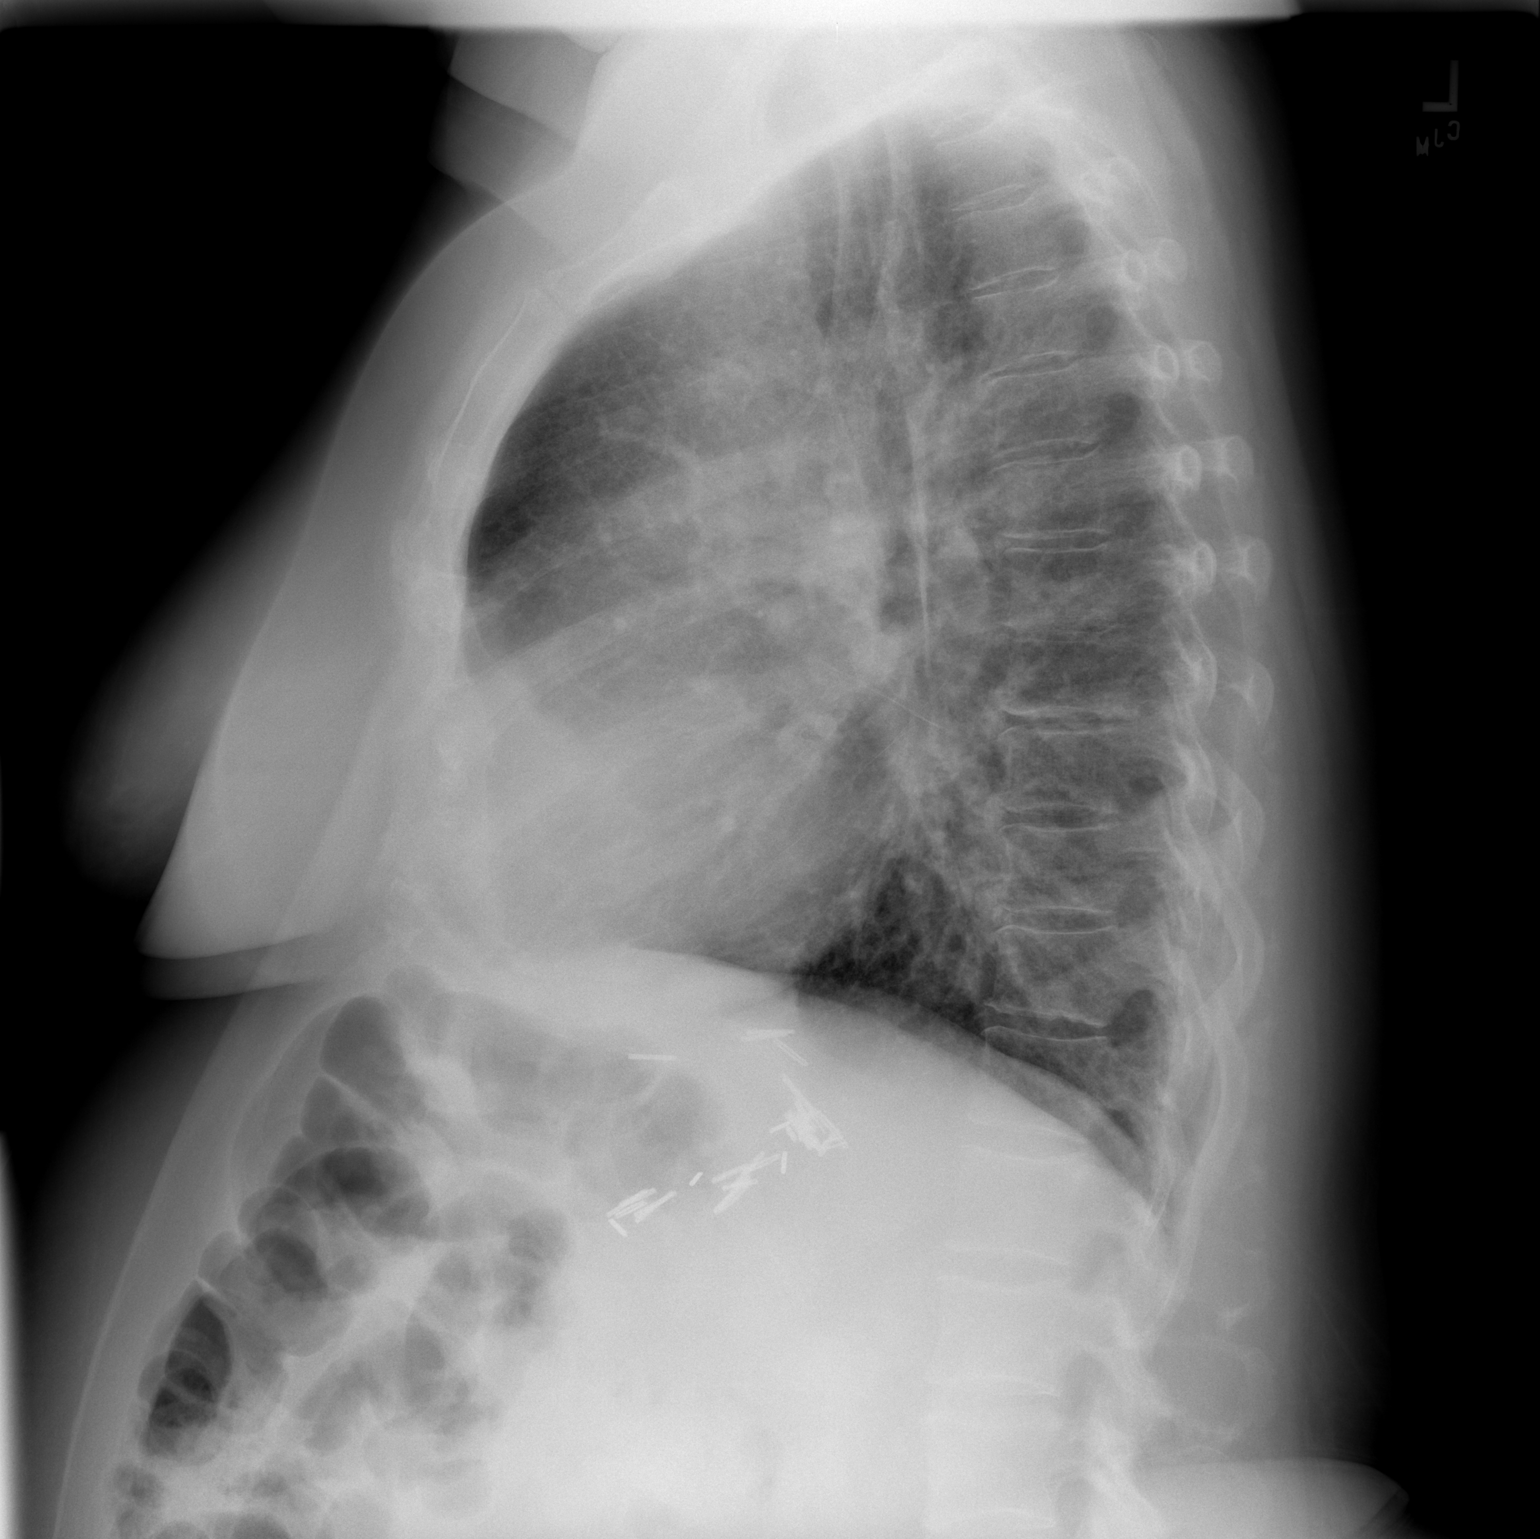

[2 of 2 positions shown; findings below may reference images not displayed]

FINDINGS: The cardiopericardial silhouette is enlarged. There is
vascular congestion without airspace pulmonary edema.  Numerous
surgical clips are seen in the region of the GE junction.  No
pleural effusion. Imaged bony structures of the thorax are intact.
IMPRESSION: Cardiomegaly with vascular congestion.

## 2011-05-12 ENCOUNTER — Emergency Department (HOSPITAL_COMMUNITY): Payer: Self-pay

## 2011-05-12 ENCOUNTER — Other Ambulatory Visit: Payer: Self-pay

## 2011-05-12 ENCOUNTER — Encounter (HOSPITAL_COMMUNITY): Payer: Self-pay | Admitting: Emergency Medicine

## 2011-05-12 ENCOUNTER — Emergency Department (HOSPITAL_COMMUNITY)
Admission: EM | Admit: 2011-05-12 | Discharge: 2011-05-12 | Disposition: A | Discharge status: Home / Self Care | Payer: Self-pay | Attending: Emergency Medicine | Admitting: Emergency Medicine

## 2011-05-12 DIAGNOSIS — J40 Bronchitis, not specified as acute or chronic: Secondary | ICD-10-CM | POA: Insufficient documentation

## 2011-05-12 DIAGNOSIS — R0602 Shortness of breath: Secondary | ICD-10-CM | POA: Insufficient documentation

## 2011-05-12 DIAGNOSIS — I509 Heart failure, unspecified: Secondary | ICD-10-CM | POA: Insufficient documentation

## 2011-05-12 MED ORDER — POTASSIUM CHLORIDE CRYS ER 10 MEQ PO TBCR: 10.0000 meq | EXTENDED_RELEASE_TABLET | Freq: Two times a day (BID) | ORAL | Status: DC

## 2011-05-12 MED ORDER — PREDNISONE 20 MG PO TABS: 60.0000 mg | ORAL_TABLET | Freq: Every day | ORAL | Status: DC

## 2011-05-12 MED ORDER — IPRATROPIUM BROMIDE 0.02 % IN SOLN
0.5000 mg | Freq: Once | RESPIRATORY_TRACT | Status: AC
  Administered 2011-05-12: 0.5 mg via RESPIRATORY_TRACT
  Filled 2011-05-12: qty 2.5

## 2011-05-12 MED ORDER — PREDNISONE 20 MG PO TABS
60.0000 mg | ORAL_TABLET | Freq: Once | ORAL | Status: AC
  Administered 2011-05-12: 60 mg via ORAL
  Filled 2011-05-12: qty 3

## 2011-05-12 MED ORDER — ALBUTEROL SULFATE (5 MG/ML) 0.5% IN NEBU
5.0000 mg | INHALATION_SOLUTION | Freq: Once | RESPIRATORY_TRACT | Status: AC
  Administered 2011-05-12: 5 mg via RESPIRATORY_TRACT
  Filled 2011-05-12: qty 1

## 2011-05-12 MED ORDER — ALBUTEROL SULFATE HFA 108 (90 BASE) MCG/ACT IN AERS
2.0000 | INHALATION_SPRAY | Freq: Once | RESPIRATORY_TRACT | Status: AC
  Administered 2011-05-12: 2 via RESPIRATORY_TRACT
  Filled 2011-05-12: qty 6.7

## 2011-05-12 MED ORDER — FUROSEMIDE 20 MG PO TABS: 40.0000 mg | ORAL_TABLET | Freq: Once | ORAL | Status: DC

## 2011-05-12 MED ORDER — FUROSEMIDE 20 MG PO TABS: 10.0000 mg | ORAL_TABLET | Freq: Two times a day (BID) | ORAL | Status: DC

## 2011-05-12 MED ORDER — AZITHROMYCIN 250 MG PO TABS
500.0000 mg | ORAL_TABLET | Freq: Once | ORAL | Status: AC
  Administered 2011-05-12: 500 mg via ORAL
  Filled 2011-05-12: qty 2

## 2011-05-12 MED ORDER — AZITHROMYCIN 250 MG PO TABS: ORAL_TABLET | ORAL | Status: DC

## 2011-05-12 NOTE — ED Notes (Signed)
Pt called for her ride. No distress. Has her o2 with her. States feeling better. Demonstrates usage of inhaler well.

## 2011-05-12 NOTE — ED Provider Notes (Signed)
Medical screening examination/treatment/procedure(s) were performed by non-physician practitioner and as supervising physician I was immediately available for consultation/collaboration.   Carmin Muskrat, MD 05/12/11 2348

## 2011-05-12 NOTE — ED Provider Notes (Signed)
History     CSN: [id number]  Arrival date & time 05/12/11  1419   First MD Initiated Contact with Patient 05/12/11 1529      Chief Complaint  Patient presents with  . Shortness of Breath    (Consider location/radiation/quality/duration/timing/severity/associated sxs/prior treatment) Patient is a 56 y.o. female presenting with shortness of breath. The history is provided by the patient.  Shortness of Breath  The current episode started 3 to 5 days ago. The onset was gradual. The problem occurs continuously. Associated symptoms include sore throat, cough, shortness of breath and wheezing. Pertinent negatives include no chest pain and no fever.  Pt states she has advanced COPD, on home O2 4LNC 24/7. States about 4 days ago, began having nasal congestion, sore throat, cough. States symptoms improving except for cough which is worsening. Pt denies any fever, states is having chills. Coughing up green phlegm. Denies chest pain, only pain with coughing. States she took her grand daughters albuterol inhaler, took it twice yesterday and once today, with no relief. Not tried any other medications. Deneis swelling in legs, n/v, weakness, dizziness, diaphoresis, recent surgeries, travel.   Past Medical History  Diagnosis Date  . Coronary artery disease     S/P PCI of LAD with DES (12/2008). Total occlusion of RCA noted at that time., medically managed. ACS ruled out 03/2009 with Lexiscan myoview . Followed by .  . Pulmonary hypertension     2-D Echo (01/6775) - Systolic pressure was moderately increased. PA peak pressure  34mHg. 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 Followed by Dr. WJoya Gaskins([facility] Heckler  . Diastolic dysfunction     2-D Echo (12/2008) - Normal LV Systolic funciton with EF 60-65%. Grade 1 diastolid dysfunction. No regional wall motion abnormalities. Moderate pulmonary HTN with  PA peak pressure 558mg.  . Marland KitchenOPD (chronic obstructive pulmonary disease)     Severe. Gold Stage IV.  PFTs (12/2008) - severe obstructive airway disease. Active tobacco use. Requires 4L O2 at home.  . Pulmonary nodule, right     Small right middle lobe nodule. Stable as of 12/2008.  . Marland Kitchenleep apnea     Presumptive. No documented sleep studies.   . Prediabetes 12/2008    HgbA1c 6.4 (12/2008)  . Hx MRSA infection     Recurrent MRSA thigh abscesses.  . Tobacco abuse     Ongoing.  . Obesity   . Hyperlipidemia   . GERD (gastroesophageal reflux disease)     S/P Nissen fundoplication.    Past Surgical History  Procedure Date  . Total abdominal hysterectomy w/ bilateral salpingoophorectomy   . Nissen fundoplication     Family History  Problem Relation Age of Onset  . Heart disease Mother 4785  Deceased from MI at 57yo. Hypertension Mother   . Heart disease Father 5418  Deceased of MI age 57yo. Hypertension Father   . Hypertension Brother   . Lung cancer      Grandmother    History  Substance Use Topics  . Smoking status: Current Everyday Smoker -- 0.5 packs/day    Types: Cigarettes  . Smokeless tobacco: Never Used  . Alcohol Use: Yes     occasional    OB History    Grav Para Term Preterm Abortions TAB SAB Ect Mult Living  Review of Systems  Constitutional: Negative for fever and chills.  HENT: Positive for congestion and sore throat. Negative for ear pain.   Eyes: Negative.   Respiratory: Positive for cough, shortness of breath and wheezing. Negative for chest tightness.   Cardiovascular: Negative for chest pain, palpitations and leg swelling.  Gastrointestinal: Negative.   Genitourinary: Negative.   Musculoskeletal: Negative.   Skin: Negative.   Neurological: Negative.   Psychiatric/Behavioral: Negative.     Allergies  Fluconazole  Home Medications   Current Outpatient Rx  Name Route Sig Dispense Refill  . BC HEADACHE POWDER PO Oral  Take 1 Package by mouth 3 (three) times daily as needed. For headache.      BP 145/85  Pulse 77  Temp(Src) 97.8 F (36.6 C) (Oral)  Resp 18  SpO2 93%  Physical Exam  Nursing note and vitals reviewed. Constitutional: She is oriented to person, place, and time. She appears well-developed and well-nourished. No distress.  HENT:  Head: Normocephalic and atraumatic.  Right Ear: External ear normal.  Left Ear: External ear normal.  Mouth/Throat: Oropharynx is clear and moist.       Clear rhinorrhea bilaterally  Eyes: Conjunctivae are normal.  Neck: Normal range of motion. Neck supple.  Cardiovascular: Normal rate, regular rhythm and normal heart sounds.   Pulmonary/Chest: Effort normal. She has wheezes.       Decreased air movement bilaterally. Wheezing in all lung fields  Abdominal: Soft. Bowel sounds are normal. She exhibits no distension. There is no tenderness.  Musculoskeletal: She exhibits no edema and no tenderness.  Lymphadenopathy:    She has no cervical adenopathy.  Neurological: She is alert and oriented to person, place, and time.  Skin: Skin is warm and dry.  Psychiatric: She has a normal mood and affect.    ED Course  Procedures (including critical care time)  Pt with URI symptoms, coughing, increase SOB. History of COPD. Pt in NAD. Wheezing on exam. Coughing. Doubt ACS, no CP, symptoms atypical. Will get ECG. CXR to rule out pneumonia. Will start nebs, steroids  Dg Chest 2 View  05/12/2011  *RADIOLOGY REPORT*  Clinical Data: Shortness of breath with cough for 3 days.  History of stent.  CHEST - 2 VIEW  Comparison: Radiographs and CT 09/07/2010.  Findings: There is stable cardiomegaly with chronic vascular congestion and diffuse central airway thickening.  There is no overt pulmonary edema or confluent airspace opacity.  There is no pleural effusion.  Multiple surgical clips are present within the epigastric region.  Osseous structures appear normal.  IMPRESSION: Stable  cardiomegaly, vascular congestion and chronic central airway thickening.  No acute cardiopulmonary process.  Original Report Authenticated By: Vivia Ewing, M.D.    Date: 05/12/2011  Rate: 67  Rhythm: normal sinus rhythm  QRS Axis: normal  Intervals: normal  ST/T Wave abnormalities: normal  Conduction Disutrbances:none  Narrative Interpretation:   Old EKG Reviewed: unchanged   6:21 PM Pt received two nebs, prednisone, zithromax 537m for possible bacterial infection. Vascular congestion on CXR, compared to old, similar in appearance. Pt used to be on lasix 10-25mBID, will start on that again. Pt's oxygen sat currenly 94-95% on 4L which is what she is on at home at all times. She is in no respiratory distress. Will d/c home. Pt states her insurance will kick in next month and she will be able to follow up.   No diagnosis found.    MDM  Renold Genta, PA 05/12/11 [place], PA 05/12/11 1825

## 2011-05-12 NOTE — ED Notes (Signed)
Pt c/o increased SOB and cough; pt sts recent bronchitis; pt sts painful with cough; pt on home O2 for COPD; pt sts not taking meds x 4 months

## 2011-06-10 ENCOUNTER — Ambulatory Visit (INDEPENDENT_AMBULATORY_CARE_PROVIDER_SITE_OTHER): Payer: Medicare Other | Admitting: Internal Medicine

## 2011-06-10 ENCOUNTER — Encounter: Payer: Self-pay | Admitting: Internal Medicine

## 2011-06-10 VITALS — BP 132/73 | HR 78 | Temp 98.0°F | Ht 65.0 in | Wt 262.5 lb

## 2011-06-10 DIAGNOSIS — N3941 Urge incontinence: Secondary | ICD-10-CM

## 2011-06-10 DIAGNOSIS — R350 Frequency of micturition: Secondary | ICD-10-CM

## 2011-06-10 DIAGNOSIS — J441 Chronic obstructive pulmonary disease with (acute) exacerbation: Secondary | ICD-10-CM

## 2011-06-10 LAB — POCT URINALYSIS DIPSTICK
Blood, UA: NEGATIVE
Protein, UA: NEGATIVE
Spec Grav, UA: 1.01
Urobilinogen, UA: 1
pH, UA: 7.5

## 2011-06-10 MED ORDER — DOXYCYCLINE HYCLATE 100 MG PO TABS: 100.0000 mg | ORAL_TABLET | Freq: Two times a day (BID) | ORAL | Status: AC

## 2011-06-10 MED ORDER — PREDNISONE 10 MG PO TABS: ORAL_TABLET | ORAL | Status: DC

## 2011-06-10 MED ORDER — TOLTERODINE TARTRATE 2 MG PO TABS: 1.0000 mg | ORAL_TABLET | Freq: Two times a day (BID) | ORAL | Status: DC

## 2011-06-10 NOTE — Progress Notes (Signed)
Subjective:     Patient ID: [name], female   DOB: 12-Dec-1955, 56 y.o.   MRN: [id number]  HPI Patient reports problems with urinary incontinence.  She has had six episodes of incontinence at night with associated urgency over the last 6 weeks.  She has always had urinary frequency but not incontinence.  Denies dysuria, fevers, or chills.  Denies hematuria, abnormal vaginal discharge or vaginal bleeding.  She denies incontinence/dribbling with coughing or laughing.  She admits to feelings of inability to empty her bladder.  She also reports low back pain for the past few days.  She describes the pain as an ache in her low back that worsens with laying down.  She does not describe any alleviating factors.  Admits to tingling and numbness in her left leg for the past month.  She admits to constipation for the past few weeks.    She also reports persistent symptoms of bronchitis.  Reports increased cough productive clear mucous with increased wheezing and SOB.  She is on 4L of continuous 02 at home.     Review of Systems  Constitutional: Negative for fever, chills, diaphoresis, activity change, appetite change, fatigue and unexpected weight change.  HENT: Negative for hearing loss, congestion and neck stiffness.   Eyes: Negative for photophobia, pain and visual disturbance.  Respiratory: Positive for cough, shortness of breath and wheezing.   Cardiovascular: Negative for chest pain and palpitations.  Gastrointestinal: Negative for abdominal pain, blood in stool and anal bleeding.  Genitourinary: Negative for dysuria, hematuria and difficulty urinating.  Musculoskeletal: Negative for joint swelling.  Neurological: Negative for syncope, speech difficulty, weakness, numbness and headaches.      Objective:   Physical Exam VItal signs reviewed GEN: No apparent distress.  Alert and oriented x 3.  Pleasant, conversant, and cooperative to exam. HEENT: head is autraumatic and normocephalic.  Neck  is supple without palpable masses or lymphadenopathy.  No JVD or carotid bruits.  Vision intact.  EOMI.  PERRLA.  Sclerae anicteric.  Conjunctivae without pallor or injection. Mucous membranes are moist.  Oropharynx is without erythema, exudates, or other abnormal lesions.   RESP:  Lungs are clear to ascultation bilaterally with good air movement.  No wheezes, ronchi, or rubs. CARDIOVASCULAR: regular rate, normal rhythm.  Clear S1, S2, no murmurs, gallops, or rubs. ABDOMEN: soft, non-tender, non-distended.  Bowels sounds present in all quadrants and normoactive.  No palpable masses. EXT: warm and dry.  Peripheral pulses equal, intact, and +2 globally.  No clubbing or cyanosis.  Trace edema in bilateral lower extremities. SKIN: warm and dry with normal turgor.  No rashes or abnormal lesions observed. NEURO: CN II-XII grossly intact.  Muscle strength +5/5 in bilateral upper and lower extremities.  Sensation is grossly intact.  No focal deficit.  Rectal tone normal.     Assessment:

## 2011-06-10 NOTE — Patient Instructions (Signed)
Schedule followup appointment with your primary care provider. We will refer you to a urologist for help with your incontinence. Tolterodine is a new medicine to help with incontinence. Use as directed. Doxycycline as an antibiotic to help with your bronchitis. Take as directed and do not miss any doses. Prednisone is a narrowing to help with your bronchitis. Take 4 tabs by mouth 43 days, then 3 tabs by mouth for 3 days, then 2 tabs by mouth for 3 days, then 1 tab by mouth for 3 days. If you develop any fevers, chills, worsening shortness of breath, weakness/numbness/tingling in any of her legs, worsening urinary incontinence, inability to urinate, bowel incontinence, inability to have a bowel movement go to the emergency room.

## 2011-06-15 DIAGNOSIS — N3941 Urge incontinence: Secondary | ICD-10-CM | POA: Insufficient documentation

## 2011-06-15 DIAGNOSIS — R350 Frequency of micturition: Secondary | ICD-10-CM | POA: Insufficient documentation

## 2011-06-15 NOTE — Assessment & Plan Note (Addendum)
Her back pain, contiptaion, and incontinence was initially concerning for possible cauda equina syndrome.  However, patient states that she has experience increased urinary urgency with nocturia for years and only recently has not woken up in time to make it to the bathroom.  She also admit to intermittent episodes constipation; this is not a new problem for her.  Additionally, she is without neurological deficit and has normal rectal tone on DRE performed today.  Given the additional history and exam findings, cauda equina syndrome seems unlikely; as does epidural abscess or other process causing neurological impingement.  Patient's symptoms seem most consistent with urge incontinence.  Will prescribe a trial of detrol for treatment of urge incontinence and obtain a u/a to assess for possible UTI that may be acutely exacerbating her incontinence. Will also refer pt to urology for further evaluation and treatment.

## 2011-06-15 NOTE — Assessment & Plan Note (Signed)
Will obtain urine analysis with culture to evaluate for urinary tract infection. Please refer to plan outlined in urge incontinence for additional details.

## 2011-07-17 IMAGING — CR DG CHEST 1V PORT
1 series · 1 of 1 positions shown · non-contrast
Comparison: 09/17/2008

CLINICAL DATA: Shortness of breath

PORTABLE CHEST - 1 VIEW

[AP]
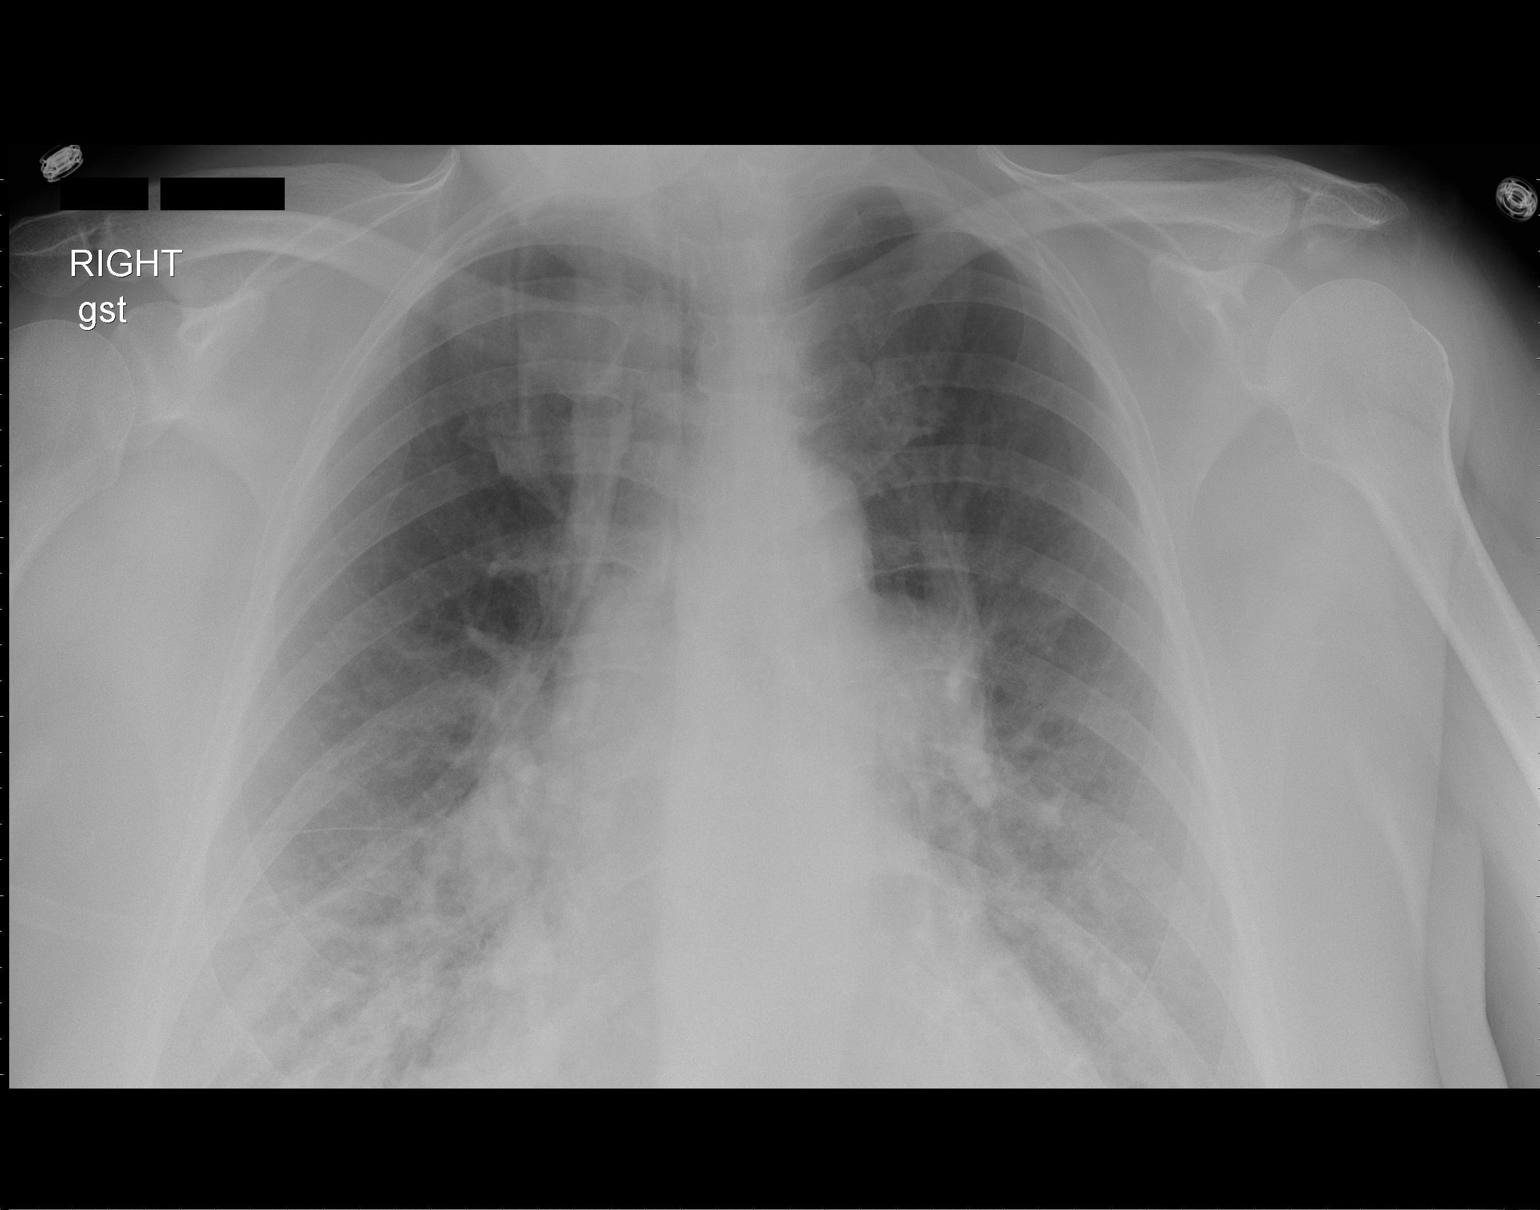

[1 of 1 positions shown; findings below may reference images not displayed]

FINDINGS: Heart size is enlarged.

No pleural effusion or pulmonary edema.

There is decreased aeration to the right lower lobe.

This may represent atelectasis and/or infiltrate.
IMPRESSION: 1.  Atelectasis versus infiltrate in the right lung base.

## 2011-07-17 IMAGING — CT CT ANGIO CHEST
2 of 7 series · 17 of 36 positions shown · IV contrast (APPLIED)
Comparison: Plain film of earlier today.  CT of 06/07/2007.

CLINICAL DATA: Chronic shortness of breath.  Hypoxia.  New right
ventricular dilatation on echocardiography.

CT ANGIOGRAPHY OF THE CHEST
TECHNIQUE: Multidetector CT angiography of the chest was performed
after contrast with bolus timed to evaluate the pulmonary arteries.
Multiplanar CT image reconstructions including MIPs were obtained
to evaluate the vascular anatomy.
Contrast:  100  ml [id number]

[Series 9: pulm embolism 1.0 b25f thins · axial · 0.65mm/px · z∈[-232,+6]mm · 15 of 274 slices shown]
[im 18/274  lung]
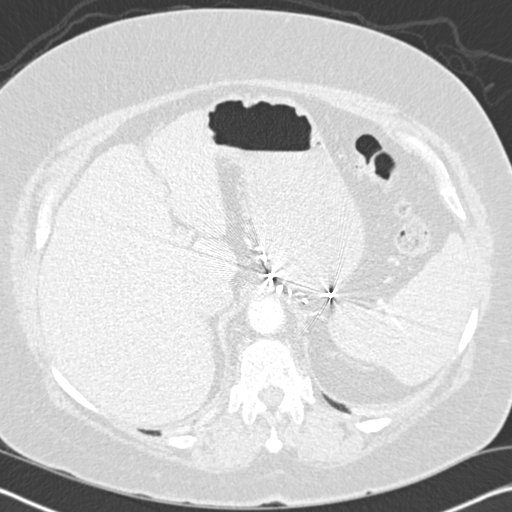
[im 35/274  mediastinal]
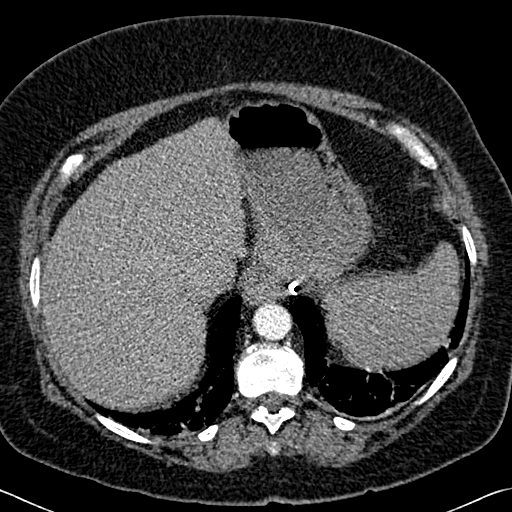
[im 52/274  lung]
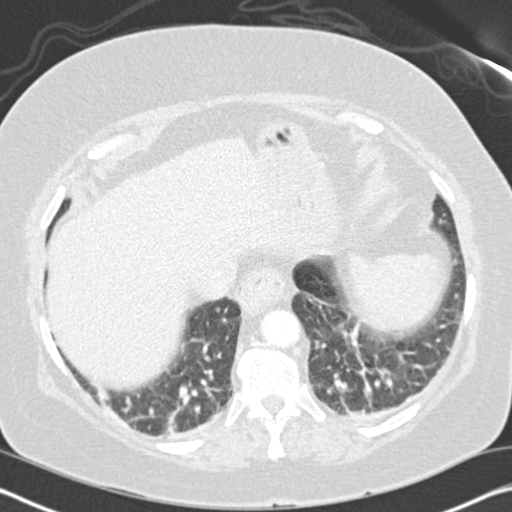
[im 69/274  mediastinal]
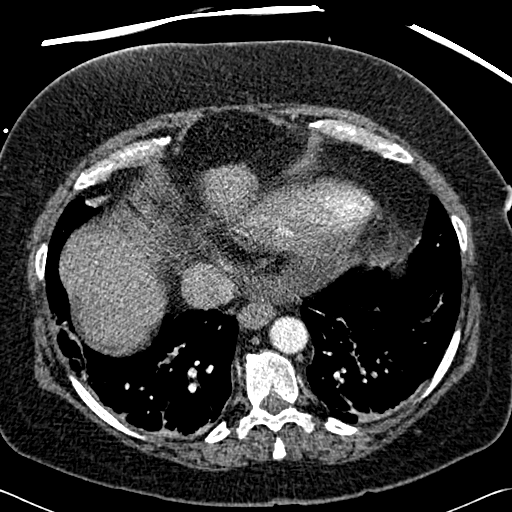
[im 86/274  lung]
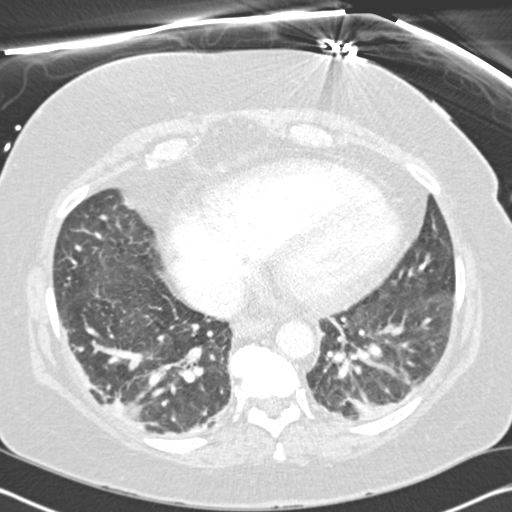
[im 103/274  mediastinal]
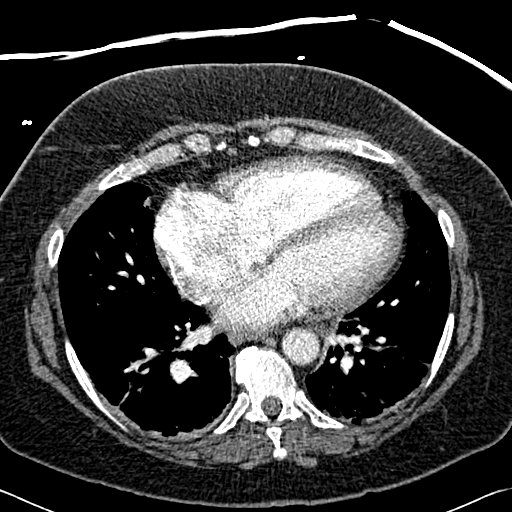
[im 120/274  lung]
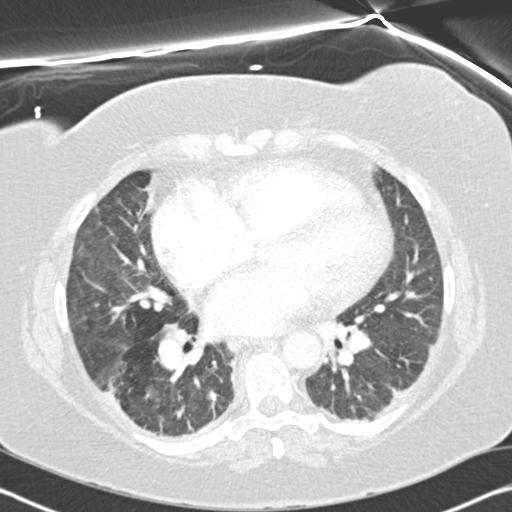
[im 137/274  mediastinal]
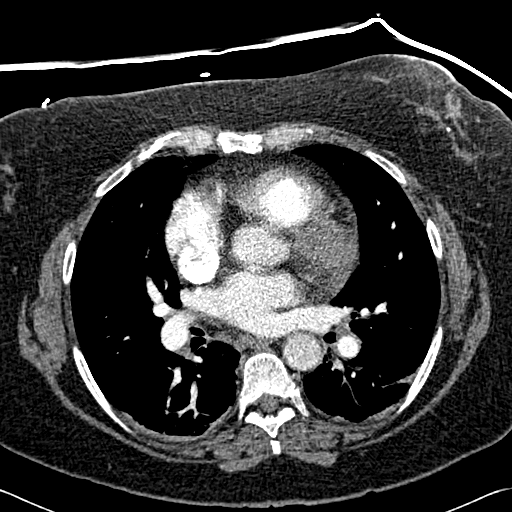
[im 154/274  lung]
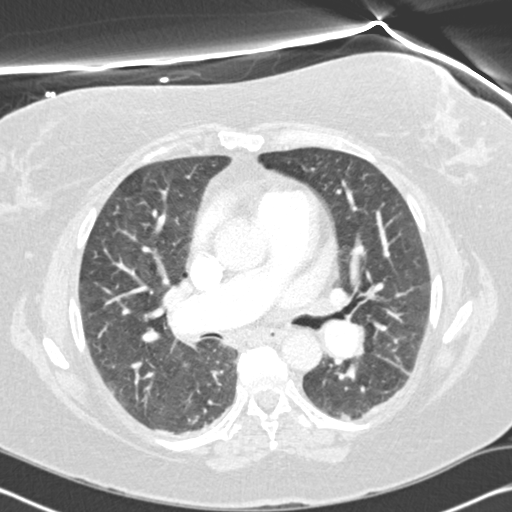
[im 171/274  mediastinal]
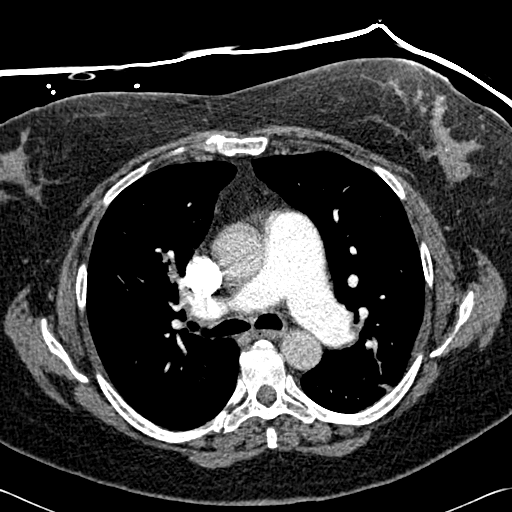
[im 188/274  lung]
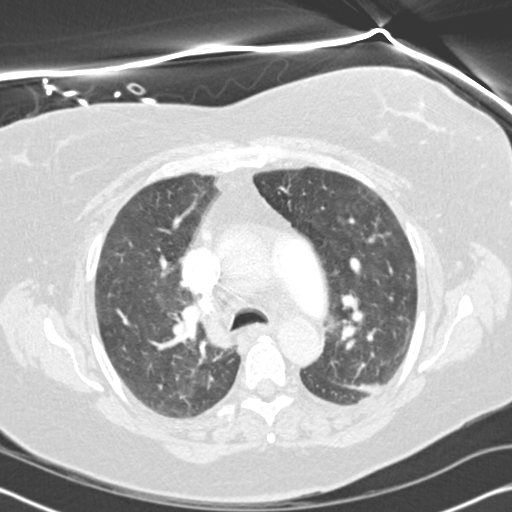
[im 205/274  mediastinal]
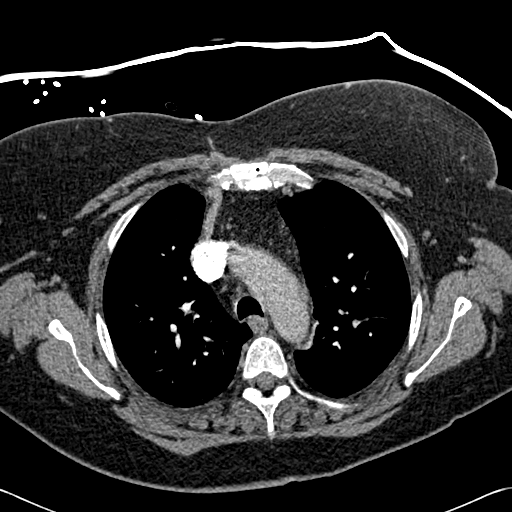
[im 222/274  lung]
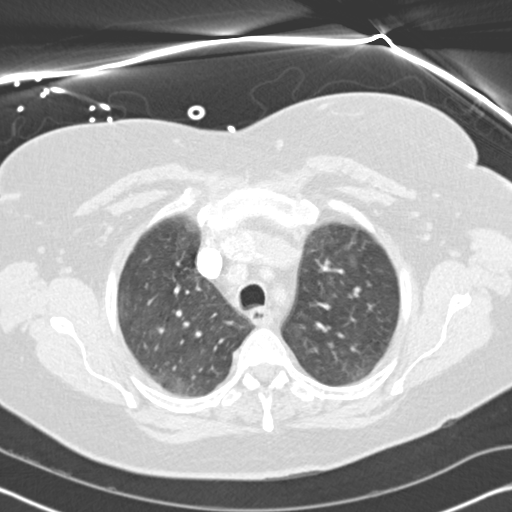
[im 239/274  mediastinal]
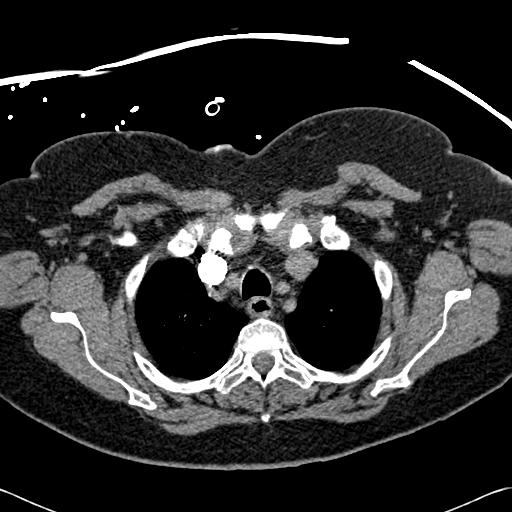
[im 256/274  lung]
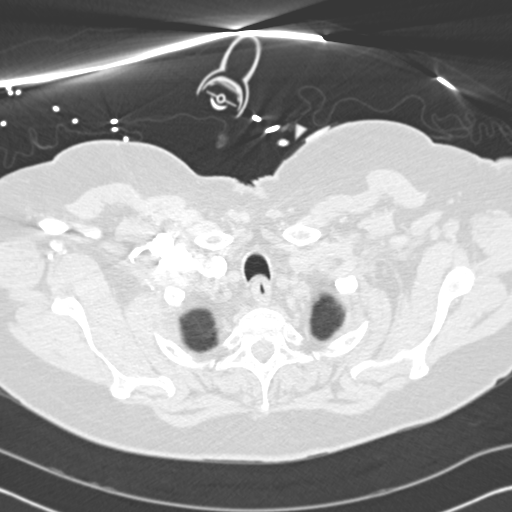

[Series 604: coronal mips · coronal · 0.65mm/px · 2 of 108 slices shown]
[im 36/108  mediastinal]
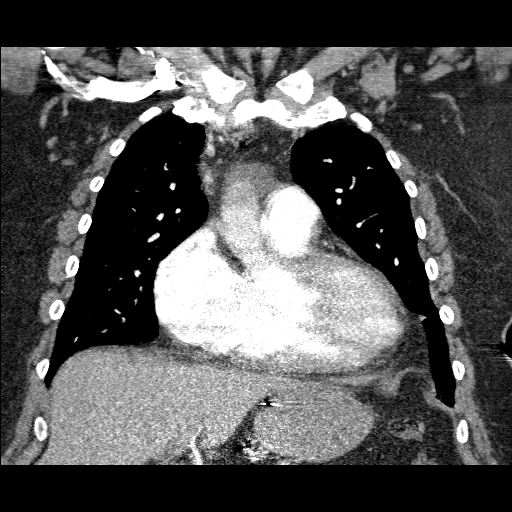
[im 72/108  mediastinal]
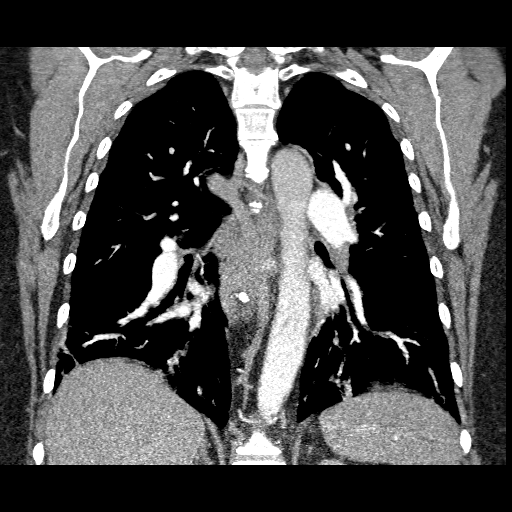

[17 of 36 positions shown; findings below may reference images not displayed]

FINDINGS: Lung windows demonstrate mild motion artifact.  Bibasilar
atelectasis.
Vague mild heterogeneous/mosaic attenuation within the lungs.  The
areas of relative lucency within the lungs have corresponding
decreased vascular size.  This suggests mosaic perfusion.  Felt to
be similar to on the prior exam.

Soft tissue windows:  The quality of this exam for evaluation of
pulmonary embolism is moderate.  The primary limitation is above
described motion artifact.  The bolus is mildly suboptimally timed.

No central, lobar, or large segmental embolism.

Enlargement of the pulmonary outflow tract.  This measures 3.4 cm.
Upper limits of normal 2.9 cm.  The pulmonary arteries measure
cm on the right and 2.6 cm left.  This pulmonary artery enlargement
was similar on the exam of 06/07/2007.

Normal aortic caliber without dissection.  Mild cardiomegaly.
Relative enlargement of the right ventricle relative to the left
ventricle suggests elevated right heart pressures. No pericardial
or pleural effusion.  9 mm low right paratracheal lymph node.
Mildly prominent left hilar nodal tissue including on image 45.

Small hiatal hernia with surgical changes at the gastroesophageal
junction.  Small prevascular lymph nodes which are decreased in
size since the prior exam.

Limited abdominal imaging demonstrates no significant findings. No
acute osseous abnormality.

 Review of the MIP images confirms the above findings.
IMPRESSION: 1. No evidence of pulmonary embolism.  Moderate quality exam with
limitations detailed above.
2.  Pulmonary artery enlargement suggests pulmonary arterial
hypertension.  Probable concurrent right heart pressure elevation
as  evidenced by relative displacement of the interventricular
septum towards the left ventricle.
3.  Mosaic perfusion within the lungs.  Differential considerations
include small airways disease or vascular phenomenon/secondary
vasoconstriction.  Expiratory imaging could help differentiate, if
desired.

## 2011-07-20 IMAGING — CT CT CHEST W/O CM
1 of 4 series · 9 of 32 positions shown, 12 images · non-contrast
Comparison: 12/10/2008

CLINICAL DATA: Follow up shortness of breath and hypoxia

CT CHEST WITHOUT CONTRAST
TECHNIQUE: Multidetector CT imaging of the chest was performed
following the standard protocol without IV contrast.

[Series 4: chest w/o 5.0 st · axial · non-contrast · 0.77mm/px · z∈[-742,-496]mm · 9 of 64 slices shown, 12 images]
[im 8/64  mediastinal]
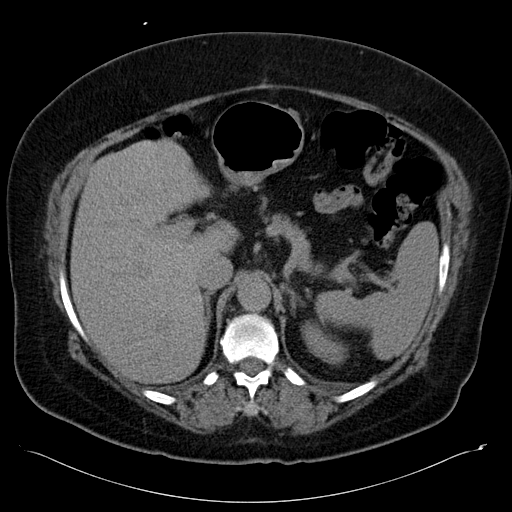
[im 8/64  lung]
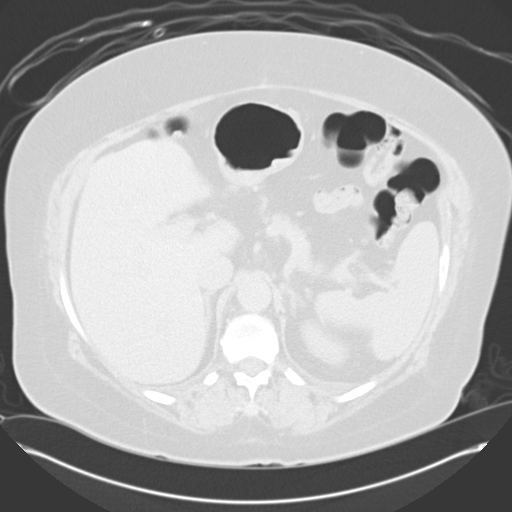
[im 15/64  lung]
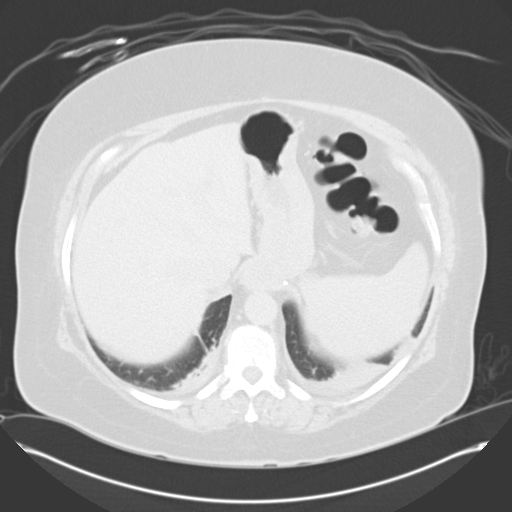
[im 22/64  lung]
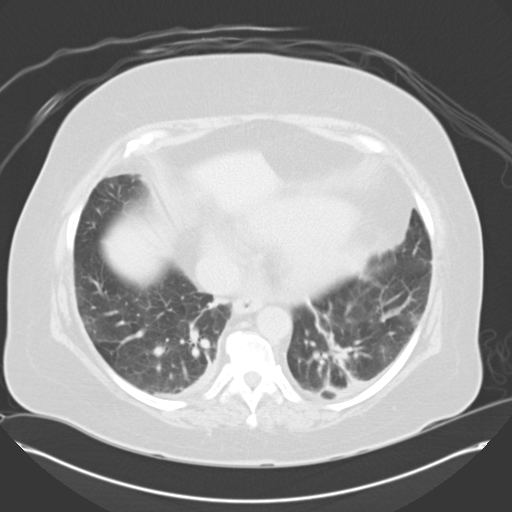
[im 29/64  lung]
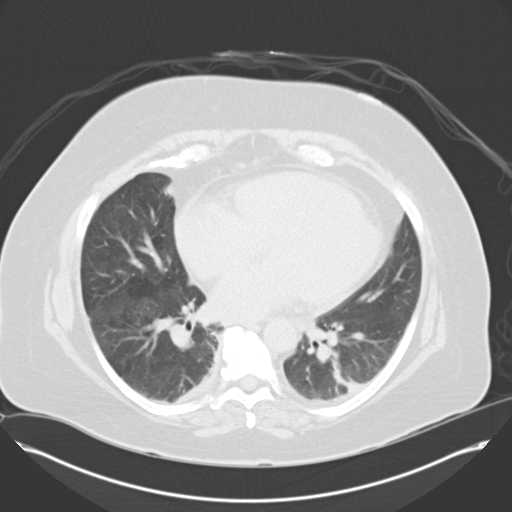
[im 31/64  mediastinal]
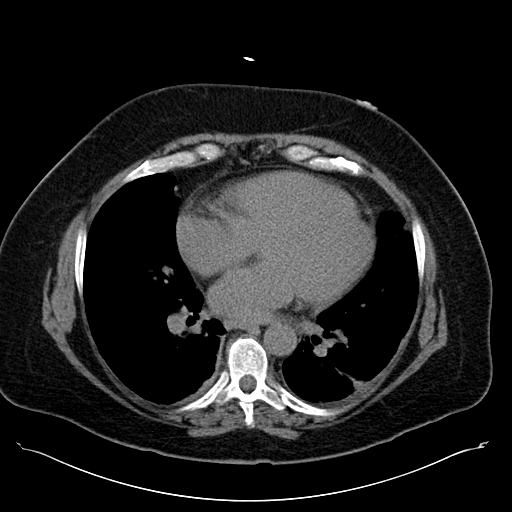
[im 31/64  lung]
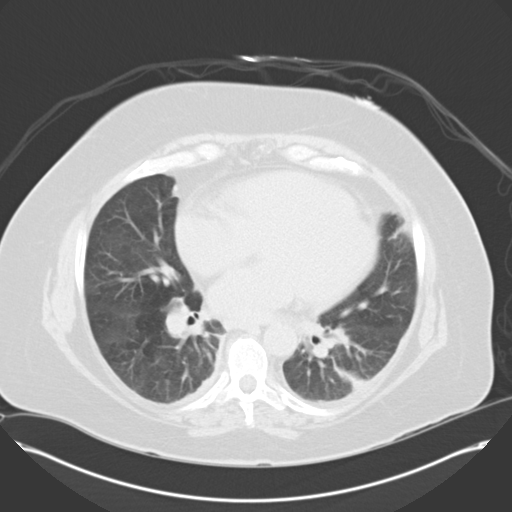
[im 36/64  lung]
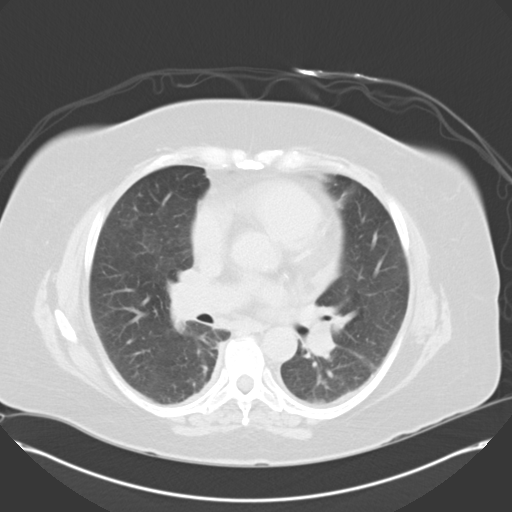
[im 43/64  lung]
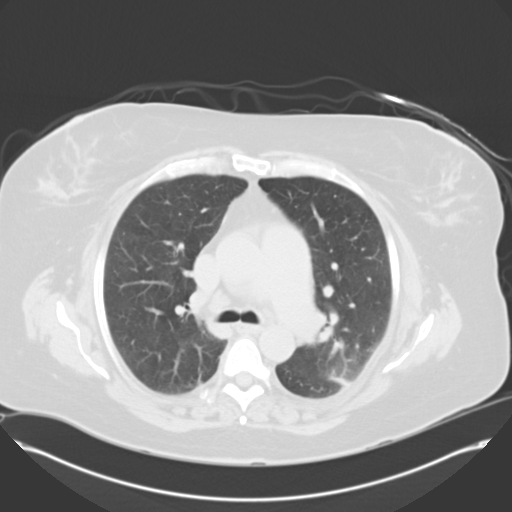
[im 50/64  lung]
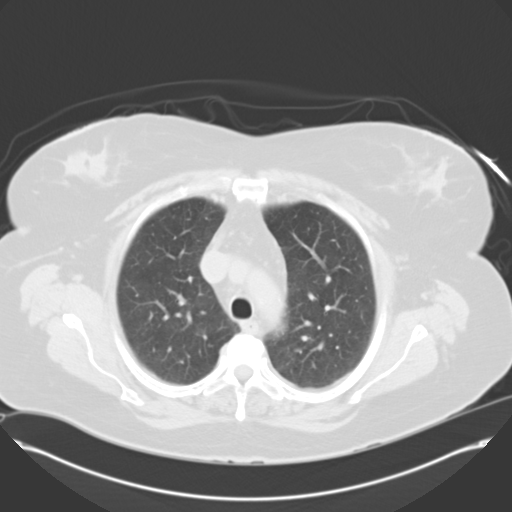
[im 57/64  mediastinal]
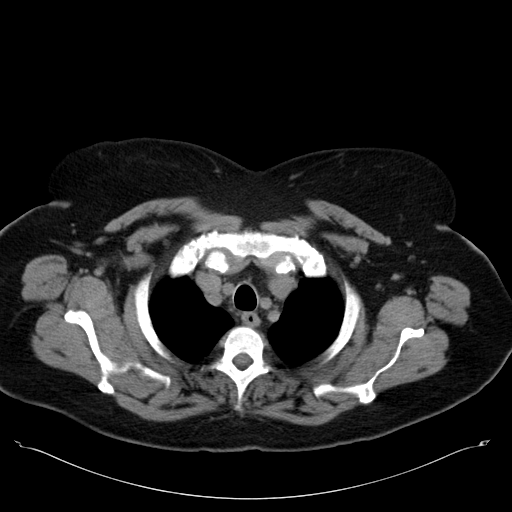
[im 57/64  lung]
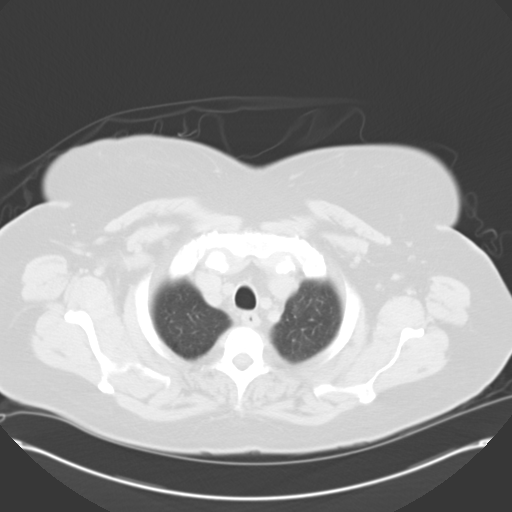

[9 of 32 positions shown; findings below may reference images not displayed]

FINDINGS: No enlarged axillary or supraclavicular lymph nodes.

There is no enlarged mediastinal or hilar lymph nodes. There is no
pericardial effusion.  There are two very small pleural effusions.

Atelectasis is present within both lung bases left greater than
right.

Tiny nodule within the right middle lobe is unchanged measuring 2-3
mm, image number 32.

There is a mosaic perfusion pattern within both lungs.  On the
excretory views there is evidence of air trapping which favors a
small airways disease.

Review of the visualized osseous structures shows multilevel
spondylosis.

Limited imaging through the upper abdomen shows extensive
postoperative changes in the region of the gastroesophageal
junction.
IMPRESSION: 1.  Very small bilateral pleural effusions with bibasilar
atelectasis.
2.  Abnormal air trapping on excretory views suggest a small
airways disease.

## 2011-07-27 NOTE — Progress Notes (Signed)
Addended by: Hulan Fray on: 07/27/2011 04:54 PM   Modules accepted: Orders

## 2011-10-08 IMAGING — CR DG CHEST 1V PORT
1 series · 1 of 1 positions shown · non-contrast
Comparison: Chest radiograph performed 12/10/2008, and CT of the
chest performed 12/13/2008

CLINICAL DATA: Mid to lower chest pain and shortness of breath;
history of smoking.

PORTABLE CHEST - 1 VIEW

[AP]
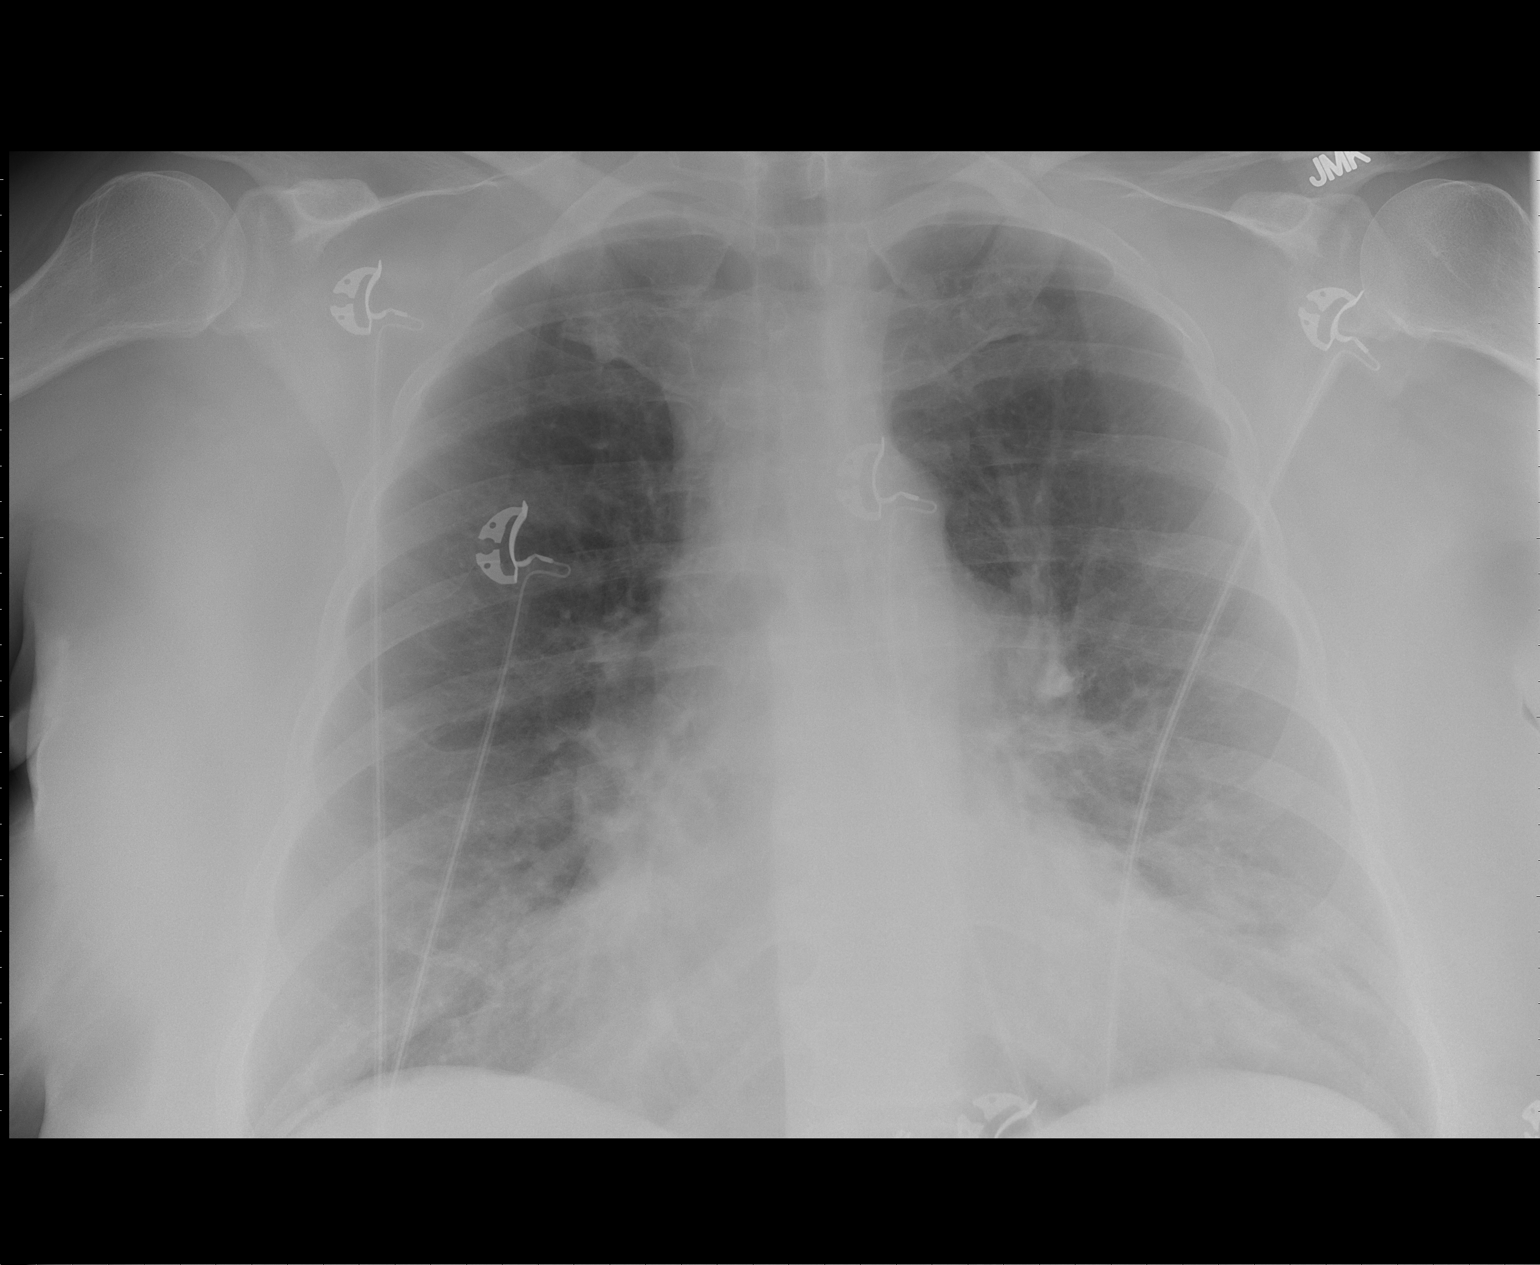

[1 of 1 positions shown; findings below may reference images not displayed]

FINDINGS: The lungs are well-aerated; mild bibasilar opacities may
reflect atelectasis or pneumonia.  There is no evidence of pleural
effusion or pneumothorax.

The cardiomediastinal silhouette is mildly enlarged.  No acute
osseous abnormalities are seen.
IMPRESSION: 1.  Mild bibasilar opacities may reflect atelectasis or pneumonia.
2.  Mild cardiomegaly.

## 2011-10-24 ENCOUNTER — Emergency Department (HOSPITAL_COMMUNITY)
Admission: EM | Admit: 2011-10-24 | Discharge: 2011-10-24 | Disposition: A | Discharge status: Home / Self Care | Payer: Medicare Other | Attending: Emergency Medicine | Admitting: Emergency Medicine

## 2011-10-24 ENCOUNTER — Encounter (HOSPITAL_COMMUNITY): Payer: Self-pay | Admitting: *Deleted

## 2011-10-24 ENCOUNTER — Emergency Department (HOSPITAL_COMMUNITY): Payer: Medicare Other

## 2011-10-24 DIAGNOSIS — G4730 Sleep apnea, unspecified: Secondary | ICD-10-CM | POA: Insufficient documentation

## 2011-10-24 DIAGNOSIS — F17200 Nicotine dependence, unspecified, uncomplicated: Secondary | ICD-10-CM | POA: Insufficient documentation

## 2011-10-24 DIAGNOSIS — Z9981 Dependence on supplemental oxygen: Secondary | ICD-10-CM | POA: Insufficient documentation

## 2011-10-24 DIAGNOSIS — I2510 Atherosclerotic heart disease of native coronary artery without angina pectoris: Secondary | ICD-10-CM | POA: Insufficient documentation

## 2011-10-24 DIAGNOSIS — E785 Hyperlipidemia, unspecified: Secondary | ICD-10-CM | POA: Insufficient documentation

## 2011-10-24 DIAGNOSIS — J4489 Other specified chronic obstructive pulmonary disease: Secondary | ICD-10-CM | POA: Insufficient documentation

## 2011-10-24 DIAGNOSIS — E669 Obesity, unspecified: Secondary | ICD-10-CM | POA: Insufficient documentation

## 2011-10-24 DIAGNOSIS — W010XXA Fall on same level from slipping, tripping and stumbling without subsequent striking against object, initial encounter: Secondary | ICD-10-CM | POA: Insufficient documentation

## 2011-10-24 DIAGNOSIS — I2789 Other specified pulmonary heart diseases: Secondary | ICD-10-CM | POA: Insufficient documentation

## 2011-10-24 DIAGNOSIS — M25579 Pain in unspecified ankle and joints of unspecified foot: Secondary | ICD-10-CM | POA: Insufficient documentation

## 2011-10-24 DIAGNOSIS — M79672 Pain in left foot: Secondary | ICD-10-CM

## 2011-10-24 DIAGNOSIS — M79609 Pain in unspecified limb: Secondary | ICD-10-CM | POA: Insufficient documentation

## 2011-10-24 DIAGNOSIS — K219 Gastro-esophageal reflux disease without esophagitis: Secondary | ICD-10-CM | POA: Insufficient documentation

## 2011-10-24 DIAGNOSIS — J449 Chronic obstructive pulmonary disease, unspecified: Secondary | ICD-10-CM | POA: Insufficient documentation

## 2011-10-24 MED ORDER — IBUPROFEN 800 MG PO TABS
800.0000 mg | ORAL_TABLET | Freq: Once | ORAL | Status: AC
  Administered 2011-10-24: 800 mg via ORAL
  Filled 2011-10-24: qty 1

## 2011-10-24 MED ORDER — IBUPROFEN 800 MG PO TABS: 400.0000 mg | ORAL_TABLET | Freq: Three times a day (TID) | ORAL | Status: AC

## 2011-10-24 NOTE — ED Notes (Signed)
Pt sts she was going down steps in her home earlier tonight when she lost her footing and fell. No LOC, did not hit or injure her head. Patient alert and oriented. sts that she fractured her left ankle last year. Can move all digits, pulse palpable. Patient ambulatory at this time, slight limp.

## 2011-10-24 NOTE — ED Provider Notes (Signed)
History     CSN: [id number]  Arrival date & time 10/24/11  1932   First MD Initiated Contact with Patient 10/24/11 2056      Chief Complaint  Patient presents with  . Ankle Pain    Left    (Consider location/radiation/quality/duration/timing/severity/associated sxs/prior treatment) HPI The patient presents immediately after a fall.  She notes that she tripped, and fell to her left ankle rolled inwards.  Since that time she's had soreness about the ankle.  She remains in the tori, but notes pain with weightbearing or any motion.  No relief with BC powder. Past Medical History  Diagnosis Date  . Coronary artery disease     S/P PCI of LAD with DES (12/2008). Total occlusion of RCA noted at that time., medically managed. ACS ruled out 03/2009 with Lexiscan myoview . Followed by [facility].  . Pulmonary hypertension     2-D Echo (89/3810) - Systolic pressure was moderately increased. PA peak pressure  66mHg. 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 Followed by Dr. WJoya Gaskins([facility]ler  . Diastolic dysfunction     2-D Echo (12/2008) - Normal LV Systolic funciton with EF 60-65%. Grade 1 diastolid dysfunction. No regional wall motion abnormalities. Moderate pulmonary HTN with PA peak pressure 527mg.  . Marland KitchenOPD (chronic obstructive pulmonary disease)     Severe. Gold Stage IV.  PFTs (12/2008) - severe obstructive airway disease. Active tobacco use. Requires 4L O2 at home.  . Pulmonary nodule, right     Small right middle lobe nodule. Stable as of 12/2008.  . Marland Kitchenleep apnea     Presumptive. No documented sleep studies.   . Prediabetes 12/2008    HgbA1c 6.4 (12/2008)  . Hx MRSA infection     Recurrent MRSA thigh abscesses.  . Tobacco abuse     Ongoing.  . Obesity   . Hyperlipidemia   . GERD (gastroesophageal reflux disease)     S/P Nissen fundoplication.    Past Surgical History  Procedure  Date  . Total abdominal hysterectomy w/ bilateral salpingoophorectomy   . Nissen fundoplication     Family History  Problem Relation Age of Onset  . Heart disease Mother [id number]  Deceased from MI at [age]yo. Hypertension Mother   . Heart disease Father [id number]  Deceased of MI age 57yo. Hypertension Father   . Hypertension Brother   . Lung cancer      Grandmother    History  Substance Use Topics  . Smoking status: Current Everyday Smoker -- 1.0 packs/day    Types: Cigarettes  . Smokeless tobacco: Never Used  . Alcohol Use: No    OB History    Grav Para Term Preterm Abortions TAB SAB Ect Mult Living                  Review of Systems  HENT: Negative.   Respiratory:       Patient is on oxygen for COPD, she denies any changes  Cardiovascular: Negative for chest pain.  Gastrointestinal: Negative.     Allergies  Fluconazole  Home Medications   Current Outpatient Rx  Name Route Sig Dispense Refill  . BC HEADACHE POWDER PO Oral Take 1 Package by mouth 3 (three) times daily as needed. For headache.      BP 136/77  Pulse 72  Temp 98.6 F (37 C) (Oral)  Resp 18  Ht 5' 5" (1.651 m)  Wt 250 lb (113.399 kg)  BMI 41.60 kg/m2  SpO2 91%  Physical Exam  Nursing note and vitals reviewed. Constitutional: She appears well-developed and well-nourished.  HENT:  Head: Normocephalic and atraumatic.  Eyes: Conjunctivae are normal.  Cardiovascular: Normal rate, regular rhythm and intact distal pulses.   Pulmonary/Chest: No stridor.       Patient is using supplemental oxygen  Musculoskeletal:       Feet:    ED Course  Procedures (including critical care time)  Labs Reviewed - No data to display No results found.   No diagnosis found.    MDM  The patient presents following a fall with left foot pain.  X-rays were negative.  The patient was discharged in stable condition to follow up with orthopedics as needed     Carmin Muskrat, MD 10/24/11 2153

## 2011-10-24 NOTE — ED Notes (Signed)
Pt verbalizes understanding

## 2012-01-04 IMAGING — CT CT MAXILLOFACIAL W/ CM
1 series · 16 of 30 positions shown, 20 images · IV contrast (agent unspecified)
Comparison: None.

CLINICAL DATA: Nasal and maxillary swelling and pain.  MRSA
infection.

CT MAXILLOFACIAL WITH CONTRAST
TECHNIQUE: Multidetector CT imaging of the maxillofacial
structures was performed with intravenous contrast. Multiplanar CT
image reconstructions were also generated.
Contrast: 100 ml Tmnipaque-EDD

[Series 3: orbit/facial 2.0 h30s · axial · 0.38mm/px · z∈[+1197,+1335]mm · 16 of 75 slices shown, 20 images]
[im 3/75  brain]
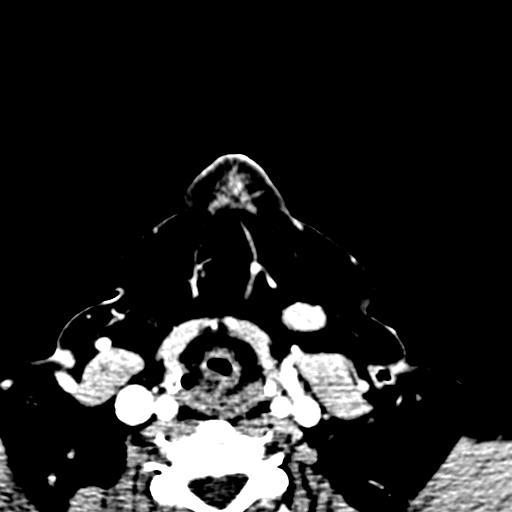
[im 3/75  bone]
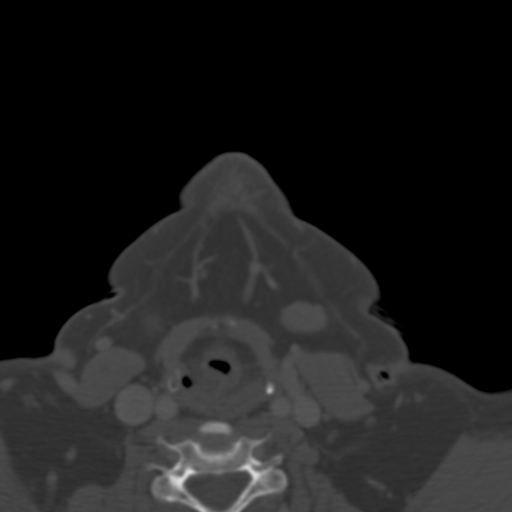
[im 8/75  bone]
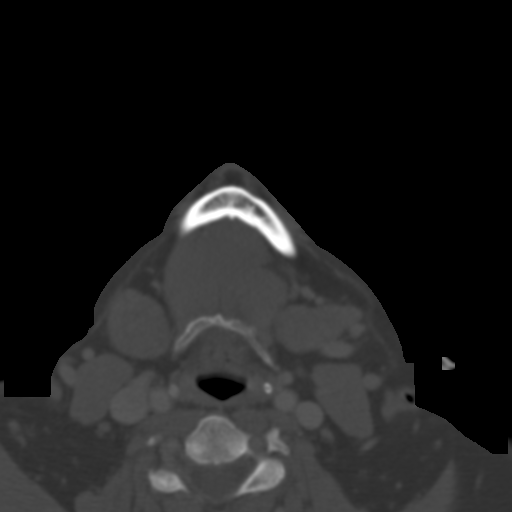
[im 13/75  bone]
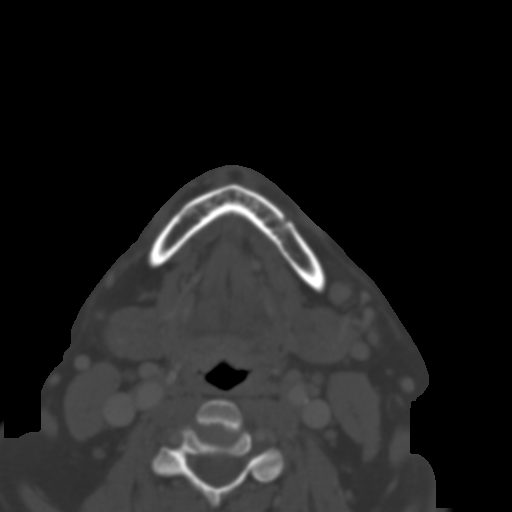
[im 18/75  bone]
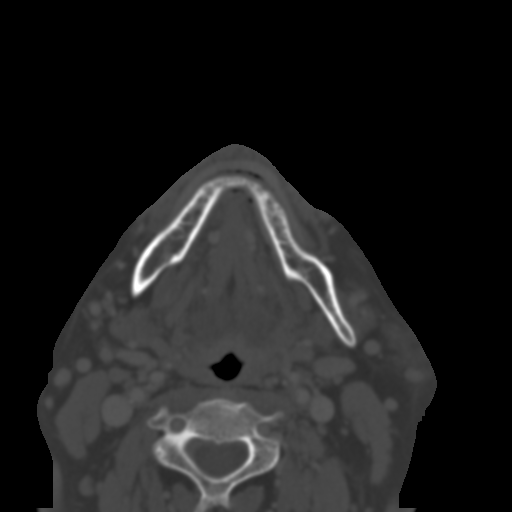
[im 21/75  brain]
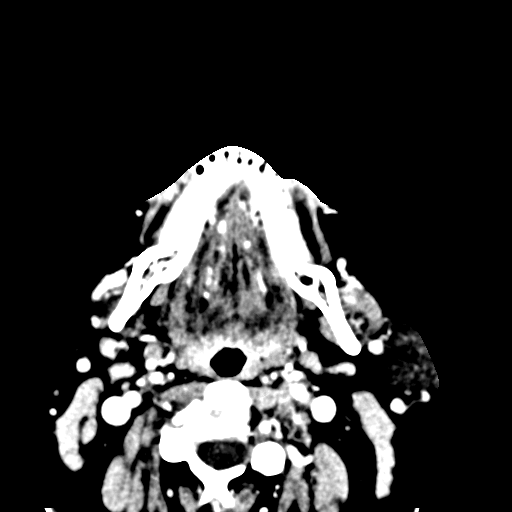
[im 21/75  bone]
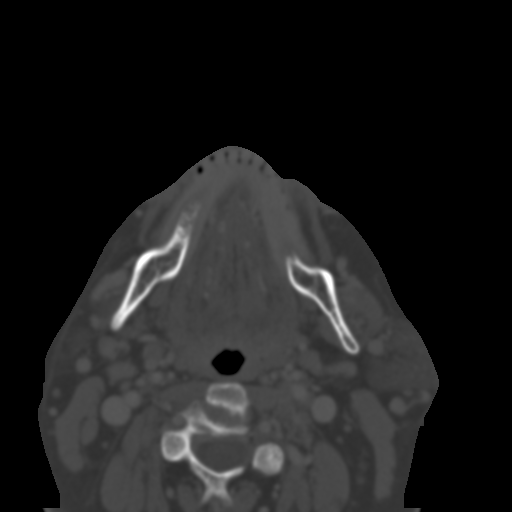
[im 26/75  bone]
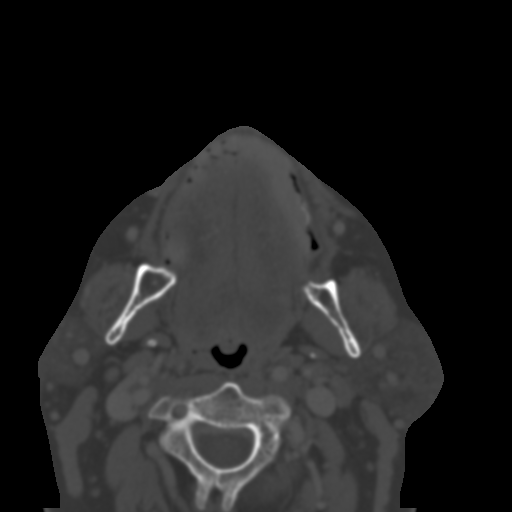
[im 31/75  bone]
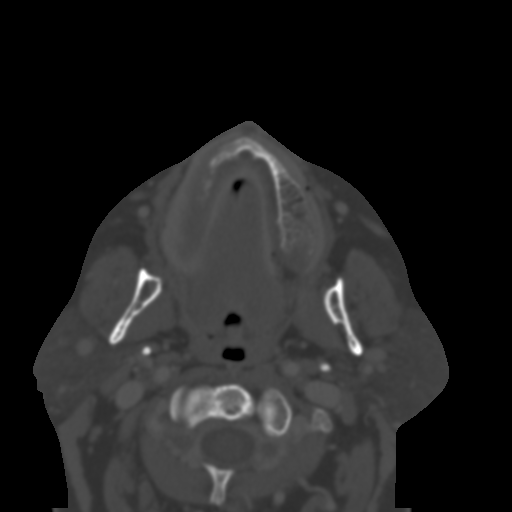
[im 36/75  bone]
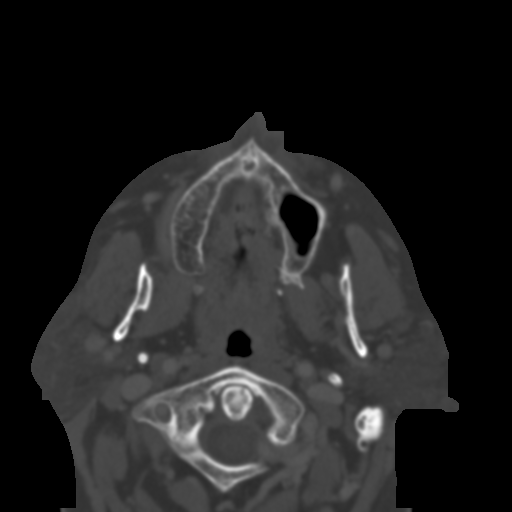
[im 39/75  brain]
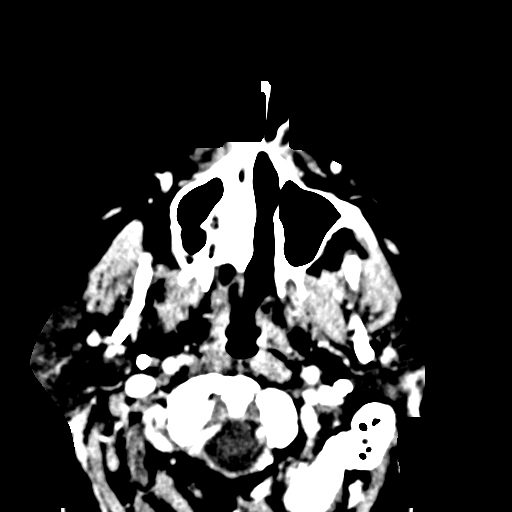
[im 39/75  bone]
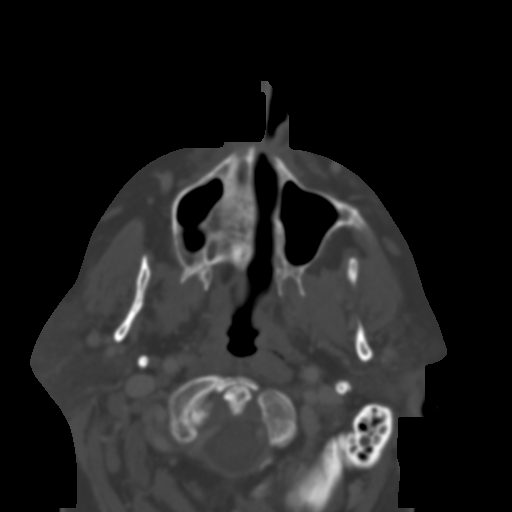
[im 44/75  bone]
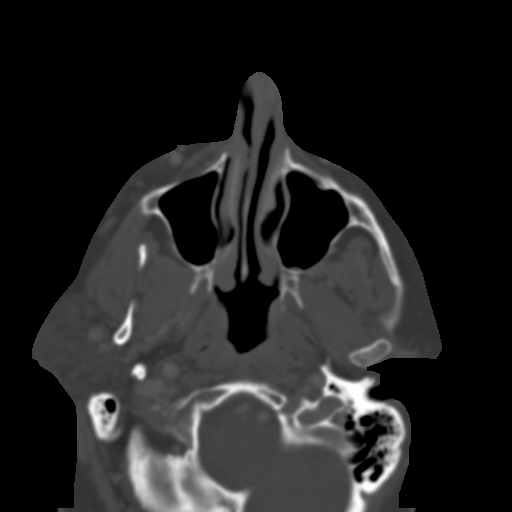
[im 49/75  bone]
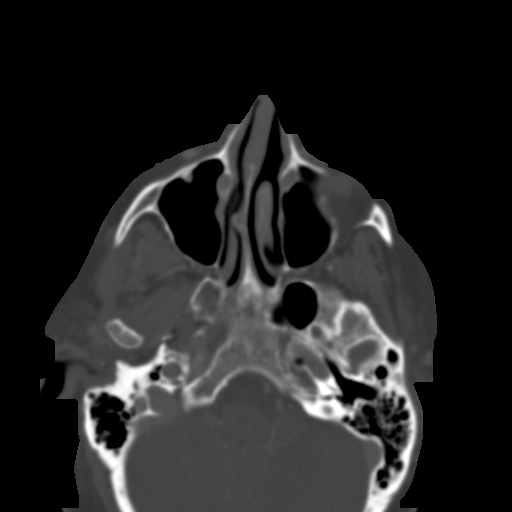
[im 54/75  bone]
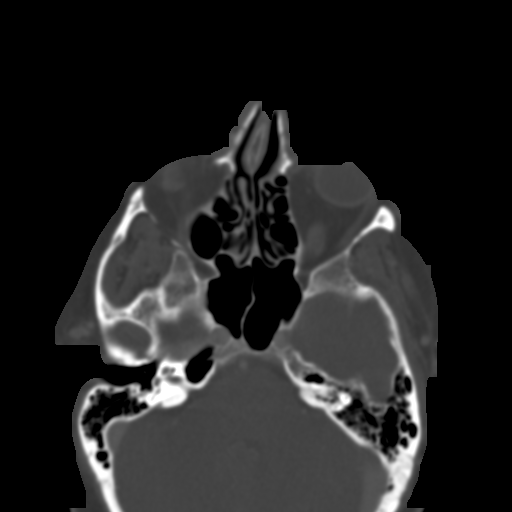
[im 57/75  brain]
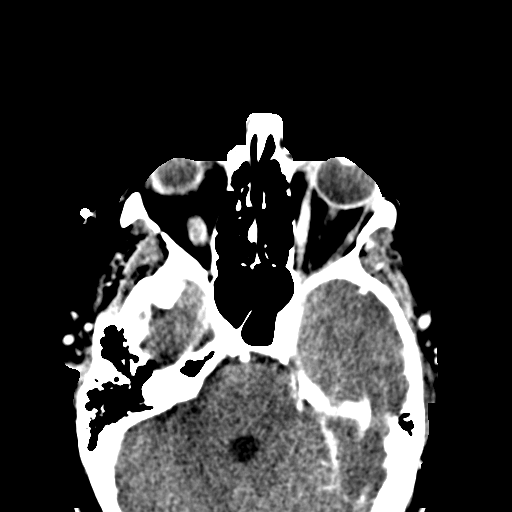
[im 57/75  bone]
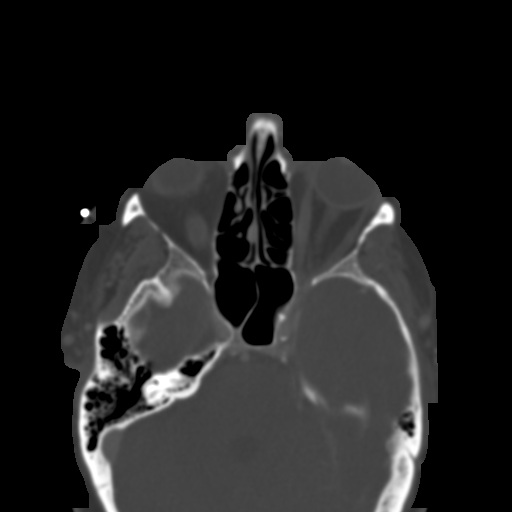
[im 62/75  bone]
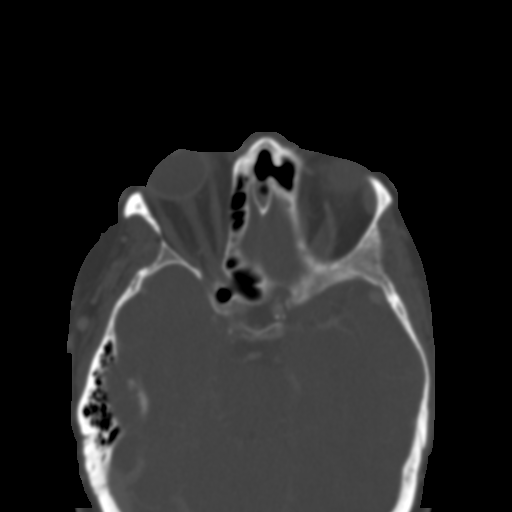
[im 67/75  bone]
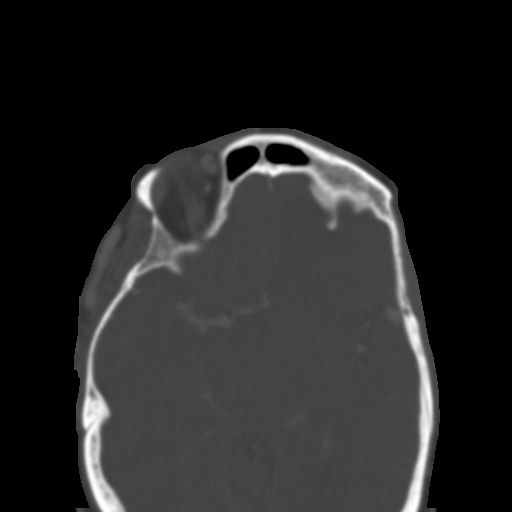
[im 72/75  bone]
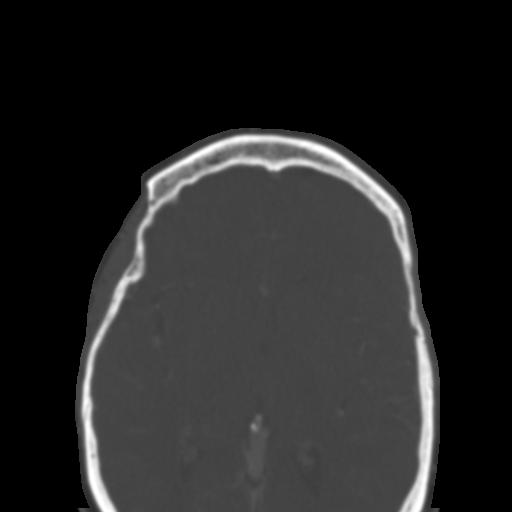

[16 of 30 positions shown; findings below may reference images not displayed]

FINDINGS: An 8 mm fluid attenuation collection is seen in the soft
tissues of the inferior nasal septum at the level of the anterior
maxillary spine, consistent with a small abscess.  Associated soft
tissue swelling is seen involving the nasal septum and upper
maxillary soft tissues in the midline.  No other abscess
identified.  No soft tissue mass identified.  No bone destruction
or other bone lesions identified.  The globes and intraorbital
structures appear normal.
IMPRESSION: 8 mm fluid collection in the soft tissues of the inferior nasal
septum, consistent with small abscess.

## 2012-01-12 ENCOUNTER — Emergency Department (HOSPITAL_COMMUNITY): Payer: Medicare Other

## 2012-01-12 ENCOUNTER — Encounter (HOSPITAL_COMMUNITY): Payer: Self-pay | Admitting: *Deleted

## 2012-01-12 ENCOUNTER — Emergency Department (HOSPITAL_COMMUNITY)
Admission: EM | Admit: 2012-01-12 | Discharge: 2012-01-12 | Disposition: A | Discharge status: Home / Self Care | Payer: Medicare Other | Attending: Emergency Medicine | Admitting: Emergency Medicine

## 2012-01-12 DIAGNOSIS — J449 Chronic obstructive pulmonary disease, unspecified: Secondary | ICD-10-CM | POA: Insufficient documentation

## 2012-01-12 DIAGNOSIS — J4489 Other specified chronic obstructive pulmonary disease: Secondary | ICD-10-CM | POA: Insufficient documentation

## 2012-01-12 DIAGNOSIS — Z9981 Dependence on supplemental oxygen: Secondary | ICD-10-CM | POA: Insufficient documentation

## 2012-01-12 DIAGNOSIS — F17200 Nicotine dependence, unspecified, uncomplicated: Secondary | ICD-10-CM | POA: Insufficient documentation

## 2012-01-12 DIAGNOSIS — K219 Gastro-esophageal reflux disease without esophagitis: Secondary | ICD-10-CM | POA: Insufficient documentation

## 2012-01-12 DIAGNOSIS — R079 Chest pain, unspecified: Secondary | ICD-10-CM | POA: Insufficient documentation

## 2012-01-12 DIAGNOSIS — R0602 Shortness of breath: Secondary | ICD-10-CM | POA: Insufficient documentation

## 2012-01-12 DIAGNOSIS — R0682 Tachypnea, not elsewhere classified: Secondary | ICD-10-CM | POA: Insufficient documentation

## 2012-01-12 DIAGNOSIS — E785 Hyperlipidemia, unspecified: Secondary | ICD-10-CM | POA: Insufficient documentation

## 2012-01-12 DIAGNOSIS — I2789 Other specified pulmonary heart diseases: Secondary | ICD-10-CM | POA: Insufficient documentation

## 2012-01-12 LAB — CBC WITH DIFFERENTIAL/PLATELET
Basophils Absolute: 0 10*3/uL (ref 0.0–0.1)
HCT: 43.4 % (ref 36.0–46.0)
Hemoglobin: 14 g/dL (ref 12.0–15.0)
Lymphocytes Relative: 25 % (ref 12–46)
Lymphs Abs: 1.9 10*3/uL (ref 0.7–4.0)
Monocytes Absolute: 0.7 10*3/uL (ref 0.1–1.0)
Neutro Abs: 4.7 10*3/uL (ref 1.7–7.7)
RBC: 4.59 MIL/uL (ref 3.87–5.11)
RDW: 13.3 % (ref 11.5–15.5)
WBC: 7.5 10*3/uL (ref 4.0–10.5)

## 2012-01-12 LAB — BASIC METABOLIC PANEL
CO2: 39 mEq/L — ABNORMAL HIGH (ref 19–32)
Chloride: 98 mEq/L (ref 96–112)
Creatinine, Ser: 0.58 mg/dL (ref 0.50–1.10)

## 2012-01-12 MED ORDER — ALBUTEROL SULFATE (5 MG/ML) 0.5% IN NEBU
5.0000 mg | INHALATION_SOLUTION | Freq: Once | RESPIRATORY_TRACT | Status: AC
  Administered 2012-01-12: 5 mg via RESPIRATORY_TRACT
  Filled 2012-01-12 (×2): qty 0.5

## 2012-01-12 MED ORDER — ALBUTEROL SULFATE HFA 108 (90 BASE) MCG/ACT IN AERS: 1.0000 | INHALATION_SPRAY | Freq: Four times a day (QID) | RESPIRATORY_TRACT | Status: DC | PRN

## 2012-01-12 MED ORDER — RANITIDINE HCL 150 MG PO CAPS: 150.0000 mg | ORAL_CAPSULE | Freq: Every day | ORAL | Status: DC

## 2012-01-12 MED ORDER — IPRATROPIUM BROMIDE 0.02 % IN SOLN
0.5000 mg | Freq: Once | RESPIRATORY_TRACT | Status: AC
  Administered 2012-01-12: 0.5 mg via RESPIRATORY_TRACT
  Filled 2012-01-12: qty 2.5

## 2012-01-12 MED ORDER — PREDNISONE 50 MG PO TABS: ORAL_TABLET | ORAL | Status: DC

## 2012-01-12 MED ORDER — PREDNISONE 20 MG PO TABS
60.0000 mg | ORAL_TABLET | Freq: Once | ORAL | Status: AC
  Administered 2012-01-12: 60 mg via ORAL
  Filled 2012-01-12: qty 3

## 2012-01-12 NOTE — ED Provider Notes (Signed)
History     CSN: [id number]  Arrival date & time 01/12/12  16   First MD Initiated Contact with Patient 01/12/12 1815      Chief Complaint  Patient presents with  . Shortness of Breath     Patient is a 56 y.o. female presenting with shortness of breath. The history is provided by the patient.  Shortness of Breath  Episode onset: several days ago. The onset was gradual. The problem occurs continuously. The problem has been gradually worsening. The problem is moderate. The symptoms are relieved by rest. Exacerbated by: exertion. Associated symptoms include chest pain and shortness of breath.  Pt presents from home for SOB She has h/o pulm HTN with chronic O2 (4L) at all times She feels SOB frequently.  She reports in past several days she feels more SOB with exertion.  She reports this is similar to prior worsening of her lung disease/COPD. She has no significant cough.  She also reports she has chest pain on the right side that is worse with breathing and palpation.  No trauma/falls but she might have slept on that side.  No fever/vomiting No new weakness reported   Past Medical History  Diagnosis Date  . Coronary artery disease     S/P PCI of LAD with DES (12/2008). Total occlusion of RCA noted at that time., medically managed. ACS ruled out 03/2009 with Lexiscan myoview . Followed by [facility].  . Pulmonary hypertension     2-D Echo (74/4514) - Systolic pressure was moderately increased. PA peak pressure  24mHg. 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 Followed by Dr. WJoya Gaskins(Velora Heckler  . Diastolic dysfunction     2-D Echo (12/2008) - Normal LV Systolic funciton with EF 60-65%. Grade 1 diastolid dysfunction. No regional wall motion abnormalities. Moderate pulmonary HTN with PA peak pressure 560mg.  . Marland KitchenOPD (chronic obstructive pulmonary disease)     Severe. Gold Stage IV.  PFTs  (12/2008) - severe obstructive airway disease. Active tobacco use. Requires 4L O2 at home.  . Pulmonary nodule, right     Small right middle lobe nodule. Stable as of 12/2008.  . Marland Kitchenleep apnea     Presumptive. No documented sleep studies.   . Prediabetes 12/2008    HgbA1c 6.4 (12/2008)  . Hx MRSA infection     Recurrent MRSA thigh abscesses.  . Tobacco abuse     Ongoing.  . Obesity   . Hyperlipidemia   . GERD (gastroesophageal reflux disease)     S/P Nissen fundoplication.    Past Surgical History  Procedure Date  . Total abdominal hysterectomy w/ bilateral salpingoophorectomy   . Nissen fundoplication     Family History  Problem Relation Age of Onset  . Heart disease Mother 471  Deceased from MI at 57yo. Hypertension Mother   . Heart disease Father [phone]  Deceased of MI age 57yo. Hypertension Father   . Hypertension Brother   . Lung cancer      Grandmother    History  Substance Use Topics  . Smoking status: Current Every Day Smoker -- 1.0 packs/day    Types: Cigarettes  . Smokeless tobacco: Never Used  . Alcohol Use: No    OB History    Grav Para Term Preterm Abortions TAB SAB Ect Mult Living  Review of Systems  Respiratory: Positive for shortness of breath.   Cardiovascular: Positive for chest pain.  All other systems reviewed and are negative.    Allergies  Fluconazole  Home Medications   Current Outpatient Rx  Name Route Sig Dispense Refill  . BC HEADACHE POWDER PO Oral Take 1 Package by mouth 3 (three) times daily as needed. For headache.      BP 151/100  Pulse 64  Temp 98.8 F (37.1 C) (Oral)  Resp 20  SpO2 93%  Physical Exam CONSTITUTIONAL: Well developed/well nourished HEAD AND FACE: Normocephalic/atraumatic EYES: EOMI/PERRL ENMT: Mucous membranes moist NECK: supple no meningeal signs SPINE:entire spine nontender CV: S1/S2 noted, no murmurs/rubs/gallops noted Chest - chest wall tender to palpation.  No  crepitance/bruising LUNGS: poor air entry bilaterally, tachypnea noted but she is able to speak to me.   ABDOMEN: soft, nontender, no rebound or guarding GU:no cva tenderness NEURO: Pt is awake/alert, moves all extremitiesx4 EXTREMITIES: pulses normal, full ROM, no pitting edema, no calf tenderness SKIN: warm, color normal PSYCH: no abnormalities of mood noted  ED Course  Procedures   Labs Reviewed  CBC WITH DIFFERENTIAL  BASIC METABOLIC PANEL   Dg Chest 2 View  01/12/2012  *RADIOLOGY REPORT*  Clinical Data: Chest tightness with shortness of breath for 2 days. Smoker.  CHEST - 2 VIEW  Comparison: Radiographs 05/12/2011.  CT 09/07/2010.  Findings: There is stable cardiac enlargement.  Central airway thickening and infrahilar scarring bilaterally are stable.  There is no airspace disease, edema or significant pleural effusion.  The osseous structures appear unchanged.  There are multiple surgical clips at the gastroesophageal junction.  IMPRESSION: Stable cardiomegaly and chronic lung disease.  No acute cardiopulmonary process.   Original Report Authenticated By: Vivia Ewing, M.D.    7:18 PM Pt well appearing, no significant distress, she is on her home O2 at this time.  She has poor air entry, will give nebs/steroids.  Her CP is reproducible and I doubt ACS/PE at this time   Pt ambulatory, no distress, no further pain.  At rest her pulse ox >90 with her home O2.  She feels improved and is requesting discharge home.  She would like to continue the prednisone at home.  She has improved air entry    MDM  Nursing notes including past medical history and social history reviewed and considered in documentation xrays reviewed and considered Labs/vital reviewed and considered Previous records reviewed and considered - h/o COPD per chart   I performed entire history/physical exam.  No midlevel involved in this case     Date: 01/12/2012  Rate: 69  Rhythm: normal sinus rhythm  QRS  Axis: normal  Intervals: normal  ST/T Wave abnormalities: nonspecific ST changes  Conduction Disutrbances:none  Narrative Interpretation:   Old EKG Reviewed: unchanged    Sharyon Cable, MD 01/13/12 0006

## 2012-01-12 NOTE — ED Notes (Signed)
Pt ambulating to bathroom without difficulty

## 2012-01-12 NOTE — ED Notes (Signed)
Patient with history of shortness of breath for the past few days.  Patient has been taking her breathing treatment with slight relief.  Patient is on home O2 at 4 Liters.  Patient also c/o right sided pain that radiates to her back

## 2012-02-05 ENCOUNTER — Encounter (HOSPITAL_COMMUNITY): Payer: Self-pay | Admitting: Emergency Medicine

## 2012-02-05 ENCOUNTER — Emergency Department (HOSPITAL_COMMUNITY): Payer: Medicare Other

## 2012-02-05 ENCOUNTER — Observation Stay (HOSPITAL_COMMUNITY)
Admission: EM | Admit: 2012-02-05 | Discharge: 2012-02-06 | Disposition: A | Discharge status: Home / Self Care | Payer: Medicare Other | Attending: Internal Medicine | Admitting: Internal Medicine

## 2012-02-05 DIAGNOSIS — Z7902 Long term (current) use of antithrombotics/antiplatelets: Secondary | ICD-10-CM | POA: Insufficient documentation

## 2012-02-05 DIAGNOSIS — Z23 Encounter for immunization: Secondary | ICD-10-CM | POA: Insufficient documentation

## 2012-02-05 DIAGNOSIS — E785 Hyperlipidemia, unspecified: Principal | ICD-10-CM | POA: Insufficient documentation

## 2012-02-05 DIAGNOSIS — I509 Heart failure, unspecified: Secondary | ICD-10-CM | POA: Insufficient documentation

## 2012-02-05 DIAGNOSIS — J441 Chronic obstructive pulmonary disease with (acute) exacerbation: Secondary | ICD-10-CM | POA: Insufficient documentation

## 2012-02-05 DIAGNOSIS — I2510 Atherosclerotic heart disease of native coronary artery without angina pectoris: Secondary | ICD-10-CM | POA: Insufficient documentation

## 2012-02-05 DIAGNOSIS — J438 Other emphysema: Secondary | ICD-10-CM | POA: Insufficient documentation

## 2012-02-05 DIAGNOSIS — I5189 Other ill-defined heart diseases: Secondary | ICD-10-CM | POA: Insufficient documentation

## 2012-02-05 DIAGNOSIS — Z720 Tobacco use: Secondary | ICD-10-CM | POA: Diagnosis present

## 2012-02-05 DIAGNOSIS — Z79899 Other long term (current) drug therapy: Secondary | ICD-10-CM | POA: Insufficient documentation

## 2012-02-05 DIAGNOSIS — R5383 Other fatigue: Secondary | ICD-10-CM | POA: Insufficient documentation

## 2012-02-05 DIAGNOSIS — I2789 Other specified pulmonary heart diseases: Secondary | ICD-10-CM | POA: Insufficient documentation

## 2012-02-05 DIAGNOSIS — J449 Chronic obstructive pulmonary disease, unspecified: Secondary | ICD-10-CM

## 2012-02-05 DIAGNOSIS — R0902 Hypoxemia: Secondary | ICD-10-CM

## 2012-02-05 DIAGNOSIS — J9612 Chronic respiratory failure with hypercapnia: Secondary | ICD-10-CM

## 2012-02-05 DIAGNOSIS — R5381 Other malaise: Secondary | ICD-10-CM | POA: Insufficient documentation

## 2012-02-05 DIAGNOSIS — R0602 Shortness of breath: Secondary | ICD-10-CM

## 2012-02-05 DIAGNOSIS — F17200 Nicotine dependence, unspecified, uncomplicated: Secondary | ICD-10-CM | POA: Insufficient documentation

## 2012-02-05 LAB — CBC WITH DIFFERENTIAL/PLATELET
Eosinophils Relative: 2 % (ref 0–5)
HCT: 43.5 % (ref 36.0–46.0)
Lymphocytes Relative: 18 % (ref 12–46)
Lymphs Abs: 1.3 10*3/uL (ref 0.7–4.0)
MCV: 97.1 fL (ref 78.0–100.0)
Monocytes Absolute: 0.8 10*3/uL (ref 0.1–1.0)
RBC: 4.48 MIL/uL (ref 3.87–5.11)
RDW: 13 % (ref 11.5–15.5)
WBC: 7.4 10*3/uL (ref 4.0–10.5)

## 2012-02-05 LAB — BASIC METABOLIC PANEL
BUN: 9 mg/dL (ref 6–23)
CO2: >45 mEq/L (ref 19–32)
Calcium: 9.4 mg/dL (ref 8.4–10.5)
Creatinine, Ser: 0.63 mg/dL (ref 0.50–1.10)
Glucose, Bld: 141 mg/dL — ABNORMAL HIGH (ref 70–99)

## 2012-02-05 LAB — URINALYSIS, ROUTINE W REFLEX MICROSCOPIC
Glucose, UA: NEGATIVE mg/dL
Hgb urine dipstick: NEGATIVE
Specific Gravity, Urine: 1.025 (ref 1.005–1.030)

## 2012-02-05 MED ORDER — ASPIRIN 81 MG PO CHEW
162.0000 mg | CHEWABLE_TABLET | Freq: Once | ORAL | Status: AC
  Administered 2012-02-05: 162 mg via ORAL
  Filled 2012-02-05: qty 2

## 2012-02-05 MED ORDER — DEXTROSE 5 % IV SOLN
500.0000 mg | INTRAVENOUS | Status: DC
  Administered 2012-02-05: 500 mg via INTRAVENOUS
  Filled 2012-02-05 (×2): qty 500

## 2012-02-05 MED ORDER — MUPIROCIN 2 % EX OINT
1.0000 "application " | TOPICAL_OINTMENT | Freq: Two times a day (BID) | CUTANEOUS | Status: DC
  Administered 2012-02-05: 1 via NASAL
  Filled 2012-02-05: qty 22

## 2012-02-05 MED ORDER — ALBUTEROL (5 MG/ML) CONTINUOUS INHALATION SOLN
15.0000 mg/h | INHALATION_SOLUTION | Freq: Once | RESPIRATORY_TRACT | Status: AC
  Administered 2012-02-05: 15 mg/h via RESPIRATORY_TRACT
  Filled 2012-02-05: qty 20

## 2012-02-05 MED ORDER — BIOTENE DRY MOUTH MT LIQD: 15.0000 mL | OROMUCOSAL | Status: DC | PRN

## 2012-02-05 MED ORDER — ASPIRIN EC 81 MG PO TBEC
81.0000 mg | DELAYED_RELEASE_TABLET | Freq: Every day | ORAL | Status: DC
  Administered 2012-02-05 – 2012-02-06 (×2): 81 mg via ORAL
  Filled 2012-02-05 (×2): qty 1

## 2012-02-05 MED ORDER — GUAIFENESIN-DM 100-10 MG/5ML PO SYRP: 5.0000 mL | ORAL_SOLUTION | ORAL | Status: DC | PRN

## 2012-02-05 MED ORDER — CHLORHEXIDINE GLUCONATE CLOTH 2 % EX PADS
6.0000 | MEDICATED_PAD | Freq: Every day | CUTANEOUS | Status: DC
  Administered 2012-02-06: 6 via TOPICAL

## 2012-02-05 MED ORDER — ALBUTEROL SULFATE (5 MG/ML) 0.5% IN NEBU: 2.5000 mg | INHALATION_SOLUTION | Freq: Once | RESPIRATORY_TRACT | Status: DC

## 2012-02-05 MED ORDER — INFLUENZA VIRUS VACC SPLIT PF IM SUSP
0.5000 mL | INTRAMUSCULAR | Status: AC
  Administered 2012-02-06: 0.5 mL via INTRAMUSCULAR
  Filled 2012-02-05: qty 0.5

## 2012-02-05 MED ORDER — ENOXAPARIN SODIUM 40 MG/0.4ML ~~LOC~~ SOLN
40.0000 mg | SUBCUTANEOUS | Status: DC
  Administered 2012-02-05: 40 mg via SUBCUTANEOUS
  Filled 2012-02-05 (×2): qty 0.4

## 2012-02-05 MED ORDER — SODIUM CHLORIDE 0.9 % IJ SOLN: 3.0000 mL | INTRAMUSCULAR | Status: DC | PRN

## 2012-02-05 MED ORDER — SODIUM CHLORIDE 0.9 % IV SOLN: 250.0000 mL | INTRAVENOUS | Status: DC | PRN

## 2012-02-05 MED ORDER — LEVALBUTEROL HCL 1.25 MG/0.5ML IN NEBU
1.2500 mg | INHALATION_SOLUTION | RESPIRATORY_TRACT | Status: DC
  Administered 2012-02-05 – 2012-02-06 (×6): 1.25 mg via RESPIRATORY_TRACT
  Filled 2012-02-05 (×10): qty 0.5

## 2012-02-05 MED ORDER — IPRATROPIUM BROMIDE 0.02 % IN SOLN
0.5000 mg | Freq: Once | RESPIRATORY_TRACT | Status: AC
  Administered 2012-02-05: 0.5 mg via RESPIRATORY_TRACT
  Filled 2012-02-05: qty 2.5

## 2012-02-05 MED ORDER — METHYLPREDNISOLONE SODIUM SUCC 125 MG IJ SOLR
125.0000 mg | Freq: Once | INTRAMUSCULAR | Status: AC
  Administered 2012-02-05: 125 mg via INTRAVENOUS
  Filled 2012-02-05: qty 2

## 2012-02-05 MED ORDER — IPRATROPIUM BROMIDE 0.02 % IN SOLN
0.5000 mg | RESPIRATORY_TRACT | Status: DC
  Administered 2012-02-05 – 2012-02-06 (×6): 0.5 mg via RESPIRATORY_TRACT
  Filled 2012-02-05 (×6): qty 2.5

## 2012-02-05 MED ORDER — AZITHROMYCIN 250 MG PO TABS
500.0000 mg | ORAL_TABLET | Freq: Once | ORAL | Status: AC
  Administered 2012-02-05: 500 mg via ORAL
  Filled 2012-02-05: qty 2

## 2012-02-05 MED ORDER — NICOTINE 7 MG/24HR TD PT24
7.0000 mg | MEDICATED_PATCH | Freq: Every day | TRANSDERMAL | Status: DC
  Administered 2012-02-05 – 2012-02-06 (×2): 7 mg via TRANSDERMAL
  Filled 2012-02-05 (×2): qty 1

## 2012-02-05 MED ORDER — SODIUM CHLORIDE 0.9 % IJ SOLN
3.0000 mL | Freq: Two times a day (BID) | INTRAMUSCULAR | Status: DC
  Administered 2012-02-05: 3 mL via INTRAVENOUS

## 2012-02-05 MED ORDER — SODIUM CHLORIDE 0.9 % IJ SOLN
3.0000 mL | Freq: Two times a day (BID) | INTRAMUSCULAR | Status: DC
  Administered 2012-02-06: 3 mL via INTRAVENOUS

## 2012-02-05 NOTE — ED Notes (Signed)
Pt c/o increased SOB and generalized weakness x 2 days with trouble holding things for a long period of time in both hands; pt sts some increased productive cough

## 2012-02-05 NOTE — H&P (Signed)
Hospital Admission Note Date: 02/05/2012  Patient name: [name] Medical record number: [id number] Date of birth: [birth year] Age: 56 y.o. Gender: female PCP: Karren Cobble, MD  Internal Medicine Teaching Service  Attending physician:  Dr. Daryll Drown     Internal Medicine Teaching Service Contact Information  1st Contact: Santa Lighter, MD  [phone] 2nd Contact:  Ivor Costa, MD   [phone]  After 5 pm or weekends: 1st Contact: Pager: [phone] 2nd Contact: Pager: [phone]  Chief Complaint: shortness of breath  History of Present Illness:  [name] is a 56 yo F with a history of COPD on home O2, recently out of her albuterol inhaler, CAD s/p 1 stent in 2011, current smoker, who presents with worsening shortness of breath and cough. SHe states that due to financial difficulties, she has not been able to refill her inhaler so she has been out of her albuterol for over a week. During that time she has had worsening SOB, dyspnea on exertion, and increased cough with green sputum production. She has also had some b/l hand tremors since she stopped smoking on Friday. Also dropped a few objects. Denies specific weakness or numbness.  +sick contacts (grandson and son with URI), itchy eyes, runny nose, nasal congestion. Decreased PO intake the past few days. Worsening short term memory over the past few weeks.  Denies chest pain, worsening LE edema, pillow orthopnea, nausea or vomiting, diarrhea or constipation.  Meds:   Medication List     As of 02/05/2012  7:32 PM    ASK your doctor about these medications         albuterol 108 (90 BASE) MCG/ACT inhaler   Commonly known as: PROVENTIL HFA;VENTOLIN HFA   Inhale 1-2 puffs into the lungs every 6 (six) hours as needed for wheezing.      albuterol (5 MG/ML) 0.5% nebulizer solution   Commonly known as: PROVENTIL   Take 2.5 mg by nebulization every 6 (six) hours as needed.      BC HEADACHE POWDER PO   Take 1 Package by mouth 3  (three) times daily as needed. For headache.      predniSONE 50 MG tablet   Commonly known as: DELTASONE   One tablet PO daily for 4 days      ranitidine 150 MG capsule   Commonly known as: ZANTAC   Take 1 capsule (150 mg total) by mouth daily.        Allergies: Allergies as of 02/05/2012 - Review Complete 02/05/2012  Allergen Reaction Noted  . Fluconazole Anaphylaxis    Past Medical History  Diagnosis Date  . Coronary artery disease     S/P PCI of LAD with DES (12/2008). Total occlusion of RCA noted at that time., medically managed. ACS ruled out 03/2009 with Lexiscan myoview . Followed by [facility].  . Pulmonary hypertension     2-D Echo (70/3500) - Systolic pressure was moderately increased. PA peak pressure  83mHg. 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 Followed by Dr. WJoya Gaskins([facility]ler  . Diastolic dysfunction     2-D Echo (12/2008) - Normal LV Systolic funciton with EF 60-65%. Grade 1 diastolid dysfunction. No regional wall motion abnormalities. Moderate pulmonary HTN with PA peak pressure 553mg.  . Marland KitchenOPD (chronic obstructive pulmonary disease)     Severe. Gold Stage IV.  PFTs (12/2008) - severe obstructive airway disease. Active tobacco use. Requires 4L O2 at home.  .Marland Kitchen  Pulmonary nodule, right     Small right middle lobe nodule. Stable as of 12/2008.  Marland Kitchen Sleep apnea     Presumptive. No documented sleep studies.   . Prediabetes 12/2008    HgbA1c 6.4 (12/2008)  . Hx MRSA infection     Recurrent MRSA thigh abscesses.  . Tobacco abuse     Ongoing.  . Obesity   . Hyperlipidemia   . GERD (gastroesophageal reflux disease)     S/P Nissen fundoplication.   Past Surgical History  Procedure Date  . Total abdominal hysterectomy w/ bilateral salpingoophorectomy   . Nissen fundoplication    Family History  Problem Relation Age of Onset  . Heart disease Mother 66     Deceased from MI at 25yo  . Hypertension Mother   . Heart disease Father 90    Deceased of MI age 31yo  . Hypertension Father   . Hypertension Brother   . Lung cancer      Grandmother   History   Social History  . Marital Status: Single    Spouse Name: N/A    Number of Children: N/A  . Years of Education: N/A   Occupational History  . Not on file.   Social History Main Topics  . Smoking status: Current Every Day Smoker -- 1.0 packs/day    Types: Cigarettes  . Smokeless tobacco: Never Used  . Alcohol Use: No  . Drug Use: No  . Sexually Active: Not on file   Other Topics Concern  . Not on file   Social History Narrative   Formerly worked as a Scientist, water quality, now disabled.Divorced.2 grown children.Lives with her grandson.    Review of Systems: Pertinent items noted in HPI   Physical Exam Blood pressure 107/64, pulse 98, temperature 98.2 F (36.8 C), temperature source Oral, resp. rate 11, SpO2 88.00%. General:  No acute distress, alert and oriented x 3 HEENT:  PERRL, EOMI, moist mucous membranes, b/l xanthomas noted Cardiovascular:  Rate is 100, normal S1, S2 no murmurs Respiratory:  Prolonged expiration, exp wheezes throughout, decreased BS throughout Abdomen:  Soft, nondistended, nontender, +bowel sounds Extremities:  Warm and well-perfused, trace edema Skin: Warm, dry, no rashes Strength: 5/5 throughout except grip strength 4/5 Cranial nerves tested and intact Neuro: Not anxious appearing, no depressed mood, normal affect  Lab results: Basic Metabolic Panel:  Basename 02/05/12 1526 02/05/12 1412  NA -- 143  K -- 3.7  CL -- 94*  CO2 -- >45*  GLUCOSE -- 141*  BUN -- 9  CREATININE -- 0.63  CALCIUM -- 9.4  MG 2.0 --  PHOS -- --   CBC:  Basename 02/05/12 1412  WBC 7.4  NEUTROABS 5.1  HGB 13.7  HCT 43.5  MCV 97.1  PLT 171   Cardiac Enzymes:  Basename 02/05/12 1526  CKTOTAL --  CKMB --  CKMBINDEX --  TROPONINI <0.30   BNP:  Basename 02/05/12  1412  PROBNP 592.2*   Urine Drug Screen: Drugs of Abuse     Component Value Date/Time   LABOPIA NEGATIVE 02/04/2009 2315   COCAINSCRNUR NEGATIVE 02/04/2009 2315   LABBENZ NEGATIVE 02/04/2009 2315   AMPHETMU NEGATIVE 02/04/2009 2315      Imaging results:  Dg Chest 2 View  02/05/2012  *RADIOLOGY REPORT*  Clinical Data: Cough, COPD, weakness  CHEST - 2 VIEW  Comparison: 01/12/2012  Findings: Stable cardiomegaly with vascular congestion and chronic interstitial changes, suspect related to chronic interstitial lung disease versus chronic edema.  Increased right base atelectasis  versus scarring.  Blunting of the right costophrenic angle noted, a small effusion is not excluded.  No pneumothorax.  Trachea is midline.  Postop changes of the GE junction.  IMPRESSION: Cardiomegaly with chronic interstitial changes versus edema.  Increased right base atelectasis and probable small right effusion.   Original Report Authenticated By: Jerilynn Mages. Annamaria Boots, M.D.       Assessment & Plan by Problem: Active Problems:  HYPERLIPIDEMIA  TOBACCO ABUSE  CORONARY ARTERY DISEASE  CHF  COPD exacerbation  Shortness of breath, cough - likely COPD exacerbation Pt with hx of COPD on home O2 presents with increased cough, green sputum production, worsening SOB, wheezing and hypoxemia in the setting of baseline COPD and recently being unable to refill her albuterol inhalers. CXR without consolidation to suggest pneumonia. Pt does not have an elevated WBC count or fever. CO2 was >45 indicating that she is chronically retaining CO2, compensated. She did have some hypotension to 70/52 in the ED, however, that resolved shortly after admission. PE less likely given no recent history of travel, no leg pain, no pleuritic pain. Pt does not appear volume overloaded, no crackles or edema on exam to indicate CHF exacerbation. CE negative x1. Will treat for COPD exacerbation.   -xopenex, ipratropium nebs q6 -iv solumedrol 19m, switch to  PO -azithromycin -ABG -O2 as needed to keep O2 bt 90-92% as se chronically retains CO2. Avoid O2 toxicity -incentive spirometry with aggressive pulmonary toilet -robitussen prn for cough  Tobacco abuse Pt notes b/l tremors since stopping smoking on Thursday. -discussed quitting smoking -will give nicotine patch  Hx of CAD s/p stent -ASA daily -pt should be on a statin, will start simvastatin  PPX -lovenox  Diet -regular  Dispo -consult social work to help with meds as outpatient   Signed: KSanta Lighter11/06/2011, 7:32 PM

## 2012-02-05 NOTE — ED Provider Notes (Addendum)
History     CSN: [id number]  Arrival date & time 02/05/12  1350   First MD Initiated Contact with Patient 02/05/12 1506      Chief Complaint  Patient presents with  . Shortness of Breath  . Weakness    (Consider location/radiation/quality/duration/timing/severity/associated sxs/prior treatment) HPI Comments: Pt with known CAD, advanced COPD, pulm HTN comes in with cc of sob, weakness. Pt reports that she has been a little more SOB than usual the last few days. She has baseline poor tolerance due to her disease to begin with, but feels her SOB is still worse than usual. She also has a new cough, with green sputum. Pt has been using 2-3 breathing tx as of late, and still feels that the wheezing never really subsides completely. No associated fevers. No chest pain, no orthopnea, PND. Pt reports feeling a little more tired than usual as well. She states that she will "doze off" while sitting on frequent occasions - though that is not new. Also reports difficulty gripping things with both of her hands - as e.g. She couldn't hold on to her diet pepsi can. Pt has no neck pain. She has no numbness, tingling in her upper extremities.   Patient is a 57 y.o. female presenting with shortness of breath and weakness. The history is provided by the patient and medical records.  Shortness of Breath  Associated symptoms include cough, shortness of breath and wheezing. Pertinent negatives include no chest pain and no fever.  Weakness The primary symptoms include dizziness. Primary symptoms do not include headaches, fever, nausea or vomiting.  Dizziness also occurs with weakness. Dizziness does not occur with nausea or vomiting.  Additional symptoms include weakness.    Past Medical History  Diagnosis Date  . Coronary artery disease     S/P PCI of LAD with DES (12/2008). Total occlusion of RCA noted at that time., medically managed. ACS ruled out 03/2009 with Lexiscan myoview . Followed by [facility].  .  Pulmonary hypertension     2-D Echo (02/6578) - Systolic pressure was moderately increased. PA peak pressure  15mHg. 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 Followed by Dr. WJoya Gaskins([facility] Heckler  . Diastolic dysfunction     2-D Echo (12/2008) - Normal LV Systolic funciton with EF 60-65%. Grade 1 diastolid dysfunction. No regional wall motion abnormalities. Moderate pulmonary HTN with PA peak pressure 553mg.  . Marland KitchenOPD (chronic obstructive pulmonary disease)     Severe. Gold Stage IV.  PFTs (12/2008) - severe obstructive airway disease. Active tobacco use. Requires 4L O2 at home.  . Pulmonary nodule, right     Small right middle lobe nodule. Stable as of 12/2008.  . Marland Kitchenleep apnea     Presumptive. No documented sleep studies.   . Prediabetes 12/2008    HgbA1c 6.4 (12/2008)  . Hx MRSA infection     Recurrent MRSA thigh abscesses.  . Tobacco abuse     Ongoing.  . Obesity   . Hyperlipidemia   . GERD (gastroesophageal reflux disease)     S/P Nissen fundoplication.    Past Surgical History  Procedure Date  . Total abdominal hysterectomy w/ bilateral salpingoophorectomy   . Nissen fundoplication     Family History  Problem Relation Age of Onset  . Heart disease Mother 4780  Deceased from MI at 57yo. Hypertension Mother   . Heart disease Father 5466  Deceased of MI age 80yo  . Hypertension Father   . Hypertension Brother   . Lung cancer      Grandmother    History  Substance Use Topics  . Smoking status: Current Every Day Smoker -- 1.0 packs/day    Types: Cigarettes  . Smokeless tobacco: Never Used  . Alcohol Use: No    OB History    Grav Para Term Preterm Abortions TAB SAB Ect Mult Living                  Review of Systems  Constitutional: Positive for activity change. Negative for fever.  HENT: Negative for facial swelling and neck pain.   Respiratory: Positive for  cough, shortness of breath and wheezing.   Cardiovascular: Negative for chest pain.  Gastrointestinal: Negative for nausea, vomiting, abdominal pain, diarrhea, constipation, blood in stool and abdominal distention.  Genitourinary: Negative for dysuria, hematuria and difficulty urinating.  Skin: Negative for color change.  Neurological: Positive for dizziness, tremors and weakness. Negative for speech difficulty, numbness and headaches.  Hematological: Does not bruise/bleed easily.  Psychiatric/Behavioral: Negative for confusion.    Allergies  Fluconazole  Home Medications   Current Outpatient Rx  Name  Route  Sig  Dispense  Refill  . ALBUTEROL SULFATE HFA 108 (90 BASE) MCG/ACT IN AERS   Inhalation   Inhale 1-2 puffs into the lungs every 6 (six) hours as needed for wheezing.   1 Inhaler   0   . ALBUTEROL SULFATE (5 MG/ML) 0.5% IN NEBU   Nebulization   Take 2.5 mg by nebulization every 6 (six) hours as needed.         . BC HEADACHE POWDER PO   Oral   Take 1 Package by mouth 3 (three) times daily as needed. For headache.         Marland Kitchen RANITIDINE HCL 150 MG PO CAPS   Oral   Take 1 capsule (150 mg total) by mouth daily.   30 capsule   0     BP 132/78  Pulse 97  Temp 98.2 F (36.8 C) (Oral)  Resp 22  SpO2 75%  Physical Exam  Nursing note and vitals reviewed. Constitutional: She is oriented to person, place, and time. She appears well-developed and well-nourished.  HENT:  Head: Normocephalic and atraumatic.       No cspine tenderness, and able to move neck freely  Eyes: EOM are normal. Pupils are equal, round, and reactive to light.  Neck: Neck supple. No JVD present.  Cardiovascular: Normal rate, regular rhythm and normal heart sounds.   No murmur heard. Pulmonary/Chest: Effort normal. No respiratory distress. She has wheezes.       Diffuse rhonchus breath sounds  Abdominal: Soft. She exhibits no distension. There is no tenderness. There is no rebound and no  guarding.  Musculoskeletal: She exhibits no edema and no tenderness.  Neurological: She is alert and oriented to person, place, and time. No cranial nerve deficit. Coordination normal.       Cerebellar exam is normal (finger to nose) Sensory exam normal for bilateral upper extremities - and patient is able to discriminate between sharp and dull. Motor exam is 4+/5 for upper and lower extremities, grip strength is equal bilaterally.    Skin: Skin is warm and dry.    ED Course  Procedures (including critical care time)  Labs Reviewed  BASIC METABOLIC PANEL - Abnormal; Notable for the following:    Chloride 94 (*)  CO2 >45 (*)     Glucose, Bld 141 (*)     All other components within normal limits  PRO B NATRIURETIC PEPTIDE - Abnormal; Notable for the following:    Pro B Natriuretic peptide (BNP) 592.2 (*)     All other components within normal limits  CBC WITH DIFFERENTIAL  MAGNESIUM  TROPONIN I  URINALYSIS, ROUTINE W REFLEX MICROSCOPIC  BLOOD GAS, ARTERIAL   Dg Chest 2 View  02/05/2012  *RADIOLOGY REPORT*  Clinical Data: Cough, COPD, weakness  CHEST - 2 VIEW  Comparison: 01/12/2012  Findings: Stable cardiomegaly with vascular congestion and chronic interstitial changes, suspect related to chronic interstitial lung disease versus chronic edema.  Increased right base atelectasis versus scarring.  Blunting of the right costophrenic angle noted, a small effusion is not excluded.  No pneumothorax.  Trachea is midline.  Postop changes of the GE junction.  IMPRESSION: Cardiomegaly with chronic interstitial changes versus edema.  Increased right base atelectasis and probable small right effusion.   Original Report Authenticated By: Jerilynn Mages. Annamaria Boots, M.D.      No diagnosis found.    MDM  Differential diagnosis includes: ACS syndrome CHF exacerbation COPD exacerbation Valvular disorder Myocarditis Pericarditis Pericardial effusion Pneumonia Pleural effusion Pulmonary  edema PE Anemia Neuropathy Electrolyte abnormality Disk herniation Spinal stenosis  Pt comes in with cc of sob, weakness, and some grip problems bilateral upper extremities. The SOB - can be attributed to his known severe cardiopulmonary disease. Has wheezing, has a new cough with new phlegm. Will get Azithromycin, Xray ordered to r/o pneumonia. If she has a pneumonia - she will need admission. Pt needs O2 at home all the time, no increased O2 need, but she does mention getting tired more easily. No new echo in at least 3 years - possible worsening pulm HTN? Has known CAD, last myoview also in 2010, and she had preserved EF at that time, and exam today doesn't show any fluid overload. O2 sats noted to be 92% on 4L, ans when she talks they drop to 87%. I checked the O2 sats from previous visit, and they are also in the upper 80s and low 90s - so doesn't appear to be a new finding - but we will keep PE in the back of our differential as well.  Finally, her neuro exam is normal, and grip strength is fine, neck exam is normal. Will check elytes. No concern for central process.   Date: 02/05/2012  Rate: 78  Rhythm: normal sinus rhythm  QRS Axis: normal  Intervals: normal  ST/T Wave abnormalities: normal  Conduction Disutrbances: none  Narrative Interpretation: unremarkable    Varney Biles, MD 02/05/12 1607  Wheezing persistent, although slightly improved air entry. Her peak flow number per RT is extremely poor. ABG shows respiratory failure on both ends - hypoxia and hypercapnia - though with pretty normal pH she is compensated. With COPD exacerbation, mild improvement with treatments - will admit this time.  Varney Biles, MD 02/05/12 1655  5:19 PM Pt not in respiratory distress, so no bipap ordered. Will repeat ABG in 2 hours, or recommend admitting team does so to trent pCO2, and to see if she will need bipap overnight. Again, compensated hypercapnic failure, so no bipap  acutely indicated.  Varney Biles, MD 02/05/12 1720

## 2012-02-06 LAB — COMPREHENSIVE METABOLIC PANEL
ALT: 11 U/L (ref 0–35)
Alkaline Phosphatase: 105 U/L (ref 39–117)
BUN: 13 mg/dL (ref 6–23)
CO2: >45 mEq/L (ref 19–32)
Chloride: 95 mEq/L — ABNORMAL LOW (ref 96–112)
GFR calc Af Amer: >90 mL/min (ref >90)
GFR calc non Af Amer: >90 mL/min (ref >90)
Glucose, Bld: 192 mg/dL — ABNORMAL HIGH (ref 70–99)
Potassium: 3.5 mEq/L (ref 3.5–5.1)
Sodium: 145 mEq/L (ref 135–145)
Total Bilirubin: 0.5 mg/dL (ref 0.3–1.2)
Total Protein: 6.9 g/dL (ref 6.0–8.3)

## 2012-02-06 LAB — CBC
HCT: 42.3 % (ref 36.0–46.0)
MCHC: 31 g/dL (ref 30.0–36.0)
Platelets: 180 10*3/uL (ref 150–400)
RDW: 13 % (ref 11.5–15.5)
WBC: 6.6 10*3/uL (ref 4.0–10.5)

## 2012-02-06 MED ORDER — ASPIRIN 81 MG PO TBEC: 81.0000 mg | DELAYED_RELEASE_TABLET | Freq: Every day | ORAL | Status: DC

## 2012-02-06 MED ORDER — PRAVASTATIN SODIUM 40 MG PO TABS: 40.0000 mg | ORAL_TABLET | Freq: Every day | ORAL | Status: DC

## 2012-02-06 MED ORDER — FLUTICASONE-SALMETEROL 250-50 MCG/DOSE IN AEPB: 1.0000 | INHALATION_SPRAY | Freq: Every day | RESPIRATORY_TRACT | Status: DC

## 2012-02-06 MED ORDER — SULFAMETHOXAZOLE-TMP DS 800-160 MG PO TABS: 1.0000 | ORAL_TABLET | Freq: Two times a day (BID) | ORAL | Status: DC

## 2012-02-06 MED ORDER — IPRATROPIUM BROMIDE 0.02 % IN SOLN: 250.0000 ug | Freq: Three times a day (TID) | RESPIRATORY_TRACT | Status: DC

## 2012-02-06 MED ORDER — PREDNISONE (PAK) 10 MG PO TABS: 10.0000 mg | ORAL_TABLET | Freq: Every day | ORAL | Status: DC

## 2012-02-06 NOTE — Discharge Summary (Signed)
Internal [facility] Discharge Note  Name: [name] MRN: [id number] DOB: March 27, 1956 56 y.o.  Date of Admission: 02/05/2012  2:41 PM Date of Discharge: 02/06/2012 Attending Physician: No att. providers found  Discharge Diagnosis: Active Problems:  HYPERLIPIDEMIA  TOBACCO ABUSE  CORONARY ARTERY DISEASE  CHF  COPD exacerbation   Discharge Medications:   Medication List     As of 02/06/2012  6:29 PM    STOP taking these medications         BC HEADACHE POWDER PO      predniSONE 50 MG tablet   Commonly known as: DELTASONE      TAKE these medications         albuterol 108 (90 BASE) MCG/ACT inhaler   Commonly known as: PROVENTIL HFA;VENTOLIN HFA   Inhale 1-2 puffs into the lungs every 6 (six) hours as needed for wheezing.      albuterol (5 MG/ML) 0.5% nebulizer solution   Commonly known as: PROVENTIL   Take 2.5 mg by nebulization every 6 (six) hours as needed.      aspirin 81 MG EC tablet   Take 1 tablet (81 mg total) by mouth daily.      Fluticasone-Salmeterol 250-50 MCG/DOSE Aepb   Commonly known as: ADVAIR   Inhale 1 puff into the lungs daily.      ipratropium 0.02 % nebulizer solution   Commonly known as: ATROVENT   Take 1.25 mLs (250 mcg total) by nebulization 3 (three) times daily.      pravastatin 40 MG tablet   Commonly known as: PRAVACHOL   Take 1 tablet (40 mg total) by mouth daily.      predniSONE 10 MG tablet   Commonly known as: STERAPRED UNI-PAK   Take 1 tablet (10 mg total) by mouth daily. Take 4 tabs daily for 3 days, 3 tabs daily for 3 days, 2 tabs daily for 3 days, 1 tab daily for 3 days, then stop.      ranitidine 150 MG capsule   Commonly known as: ZANTAC   Take 1 capsule (150 mg total) by mouth daily.      sulfamethoxazole-trimethoprim 800-160 MG per tablet   Commonly known as: BACTRIM DS   Take 1 tablet by mouth 2 (two) times daily.        Disposition and follow-up:   [name] was discharged from  [facility] in stable condition.  At the hospital follow up visit please address the following  -f/u COPD exacerbation -f/u with pt having trouble filling inhaler Rx -additional counseling on daily ASA use for CAD prevention -general risk stratification  Follow-up Appointments:     Follow-up Information    Follow up with Dorothy Spark, MD. On 02/15/2012. (13Nov at 2:45 pm with Dr. Jule Ser)    Contact information:   [address] [place] [address] [phone]         Discharge Orders    Future Appointments: Provider: Department: Dept Phone: Center:   02/15/2012 2:45 PM Dorothy Spark, MD [place] [phone] [facility]   03/09/2012 10:45 AM Terressa Koyanagi, MD [place] [phone] [facility]     Future Orders Please Complete By Expires   Diet - low sodium heart healthy      Increase activity slowly         Consultations:  none  Procedures Performed:  Dg Chest 2 View  02/05/2012  *RADIOLOGY REPORT*  Clinical Data: Cough, COPD, weakness  CHEST - 2 VIEW  Comparison: 01/12/2012  Findings: Stable cardiomegaly with vascular congestion and chronic interstitial changes, suspect related to chronic interstitial lung disease versus chronic edema.  Increased right base atelectasis versus scarring.  Blunting of the right costophrenic angle noted, a small effusion is not excluded.  No pneumothorax.  Trachea is midline.  Postop changes of the GE junction.  IMPRESSION: Cardiomegaly with chronic interstitial changes versus edema.  Increased right base atelectasis and probable small right effusion.   Original Report Authenticated By: Jerilynn Mages. Shick, M.D.    Dg Chest 2 View  01/12/2012  *RADIOLOGY REPORT*  Clinical Data: Chest tightness with shortness of breath for 2 days. Smoker.  CHEST - 2 VIEW  Comparison: Radiographs 05/12/2011.  CT 09/07/2010.  Findings: There is stable cardiac enlargement.  Central airway  thickening and infrahilar scarring bilaterally are stable.  There is no airspace disease, edema or significant pleural effusion.  The osseous structures appear unchanged.  There are multiple surgical clips at the gastroesophageal junction.  IMPRESSION: Stable cardiomegaly and chronic lung disease.  No acute cardiopulmonary process.   Original Report Authenticated By: Vivia Ewing, M.D.      Admission HPI: [name] is a 56 yo F with a history of COPD on home O2, recently out of her albuterol inhaler, CAD s/p 1 stent in 2011, current smoker, who presents with worsening shortness of breath and cough. SHe states that due to financial difficulties, she has not been able to refill her inhaler so she has been out of her albuterol for over a week. During that time she has had worsening SOB, dyspnea on exertion, and increased cough with green sputum production. She has also had some b/l hand tremors since she stopped smoking on Friday. Also dropped a few objects. Denies specific weakness or numbness.  +sick contacts (grandson and son with URI), itchy eyes, runny nose, nasal congestion. Decreased PO intake the past few days. Worsening short term memory over the past few weeks.  Denies chest pain, worsening LE edema, pillow orthopnea, nausea or vomiting, diarrhea or constipation   Hospital Course by problem list: Active Problems:  HYPERLIPIDEMIA  TOBACCO ABUSE  CORONARY ARTERY DISEASE  CHF  COPD exacerbation    COPD exacerbation  Pt with hx of COPD on home O2 presents with increased cough, green sputum production, worsening SOB, wheezing and hypoxemia in the setting of baseline COPD and recently being unable to refill her albuterol inhalers. CXR on 02/05/12 without consolidation to suggest pneumonia. She did not have an elevated WBC count or fever. CO2 was >45 indicating that she is chronically retaining CO2, compensated. She did have some hypotension to 70/52 in the ED, however, that resolved shortly  after admission. PE was thought to be less likely given no recent history of travel, no leg pain, no pleuritic pain. Pt did not appear volume overloaded, no crackles or edema on exam to indicate CHF exacerbation. CE were negative. She was treated for a COPD exacerbation with xopenex, ipratropium nebs q6, iv solumedrol 148m, azithromycin,O2 as needed to keep O2 bt 90-92% as she chronically retains CO2, and incentive spirometry with aggressive pulmonary toilet. On the day of discharge her symptoms had improved dramatically, her wheezing had improved, and her O2 requirement was stable. She was discharged to home with a Rx for a prednisone taper, antibiotics, as well as refills for her inhalers.  Tobacco abuse  Pt presented with b/l UR tremors since stopping smoking several days  previous to admission. She was given a nicotine patch and her tremors resolved. We discussed quitting smoking and offered resources and support. Will need outpatient f/u.   Hx of CAD s/p stent  Pt was taking BC at home which has >800 mg ASA. We recommended switching to daily 26m ASA. She is also not on a statin, so she was given a prescriptions for a statin on the [facility] $4 list.   Discharge Vitals:  BP 126/72  Pulse 86  Temp 98.5 F (36.9 C) (Oral)  Resp 20  Ht _0  (1.651 m)  Wt 249 lb 5.4 oz (113.1 kg)  BMI 41.49 kg/m2  SpO2 90%  Discharge Labs:  Results for orders placed during the hospital encounter of 02/05/12 (from the past 24 hour(s))  MRSA PCR SCREENING     Status: Abnormal   Collection Time   02/05/12  7:57 PM      Component Value Range   MRSA by PCR POSITIVE (*) NEGATIVE  URINALYSIS, ROUTINE W REFLEX MICROSCOPIC     Status: Abnormal   Collection Time   02/05/12  9:17 PM      Component Value Range   Color, Urine ORANGE (*) YELLOW   APPearance CLOUDY (*) CLEAR   Specific Gravity, Urine 1.025  1.005 - 1.030   pH 6.0  5.0 - 8.0   Glucose, UA NEGATIVE  NEGATIVE mg/dL   Hgb urine dipstick NEGATIVE   NEGATIVE   Bilirubin Urine LARGE (*) NEGATIVE   Ketones, ur 40 (*) NEGATIVE mg/dL   Protein, ur 30 (*) NEGATIVE mg/dL   Urobilinogen, UA >8.0 (*) 0.0 - 1.0 mg/dL   Nitrite NEGATIVE  NEGATIVE   Leukocytes, UA SMALL (*) NEGATIVE  URINE MICROSCOPIC-ADD ON     Status: Abnormal   Collection Time   02/05/12  9:17 PM      Component Value Range   Squamous Epithelial / LPF MANY (*) RARE   WBC, UA 3-6  <3 WBC/hpf   Bacteria, UA MANY (*) RARE   Casts HYALINE CASTS (*) NEGATIVE   Urine-Other LESS THAN 10 mL OF URINE SUBMITTED    COMPREHENSIVE METABOLIC PANEL     Status: Abnormal   Collection Time   02/06/12  4:35 AM      Component Value Range   Sodium 145  135 - 145 mEq/L   Potassium 3.5  3.5 - 5.1 mEq/L   Chloride 95 (*) 96 - 112 mEq/L   CO2 >45 (*) 19 - 32 mEq/L   Glucose, Bld 192 (*) 70 - 99 mg/dL   BUN 13  6 - 23 mg/dL   Creatinine, Ser 0.67  0.50 - 1.10 mg/dL   Calcium 9.4  8.4 - 10.5 mg/dL   Total Protein 6.9  6.0 - 8.3 g/dL   Albumin 3.1 (*) 3.5 - 5.2 g/dL   AST 13  0 - 37 U/L   ALT 11  0 - 35 U/L   Alkaline Phosphatase 105  39 - 117 U/L   Total Bilirubin 0.5  0.3 - 1.2 mg/dL   GFR calc non Af Amer >90  >90 mL/min   GFR calc Af Amer >90  >90 mL/min  CBC     Status: Normal   Collection Time   02/06/12  4:35 AM      Component Value Range   WBC 6.6  4.0 - 10.5 K/uL   RBC 4.39  3.87 - 5.11 MIL/uL   Hemoglobin 13.1  12.0 - 15.0 g/dL   HCT  42.3  36.0 - 46.0 %   MCV 96.4  78.0 - 100.0 fL   MCH 29.8  26.0 - 34.0 pg   MCHC 31.0  30.0 - 36.0 g/dL   RDW 13.0  11.5 - 15.5 %   Platelets 180  150 - 400 K/uL    Signed: Santa Lighter 02/06/2012, 6:29 PM   Time Spent on Discharge: 35 minutes Services Ordered on Discharge: none Equipment Ordered on Discharge: none

## 2012-02-06 NOTE — H&P (Signed)
Internal Medicine Teaching Service Attending Note Date: 02/06/2012  Patient name: [name]  Medical record number: [id number]  Date of birth: 31-Jul-1955   I have seen and evaluated [name] and discussed their care with the Residency Team.    [name] is a 56yo woman with a PMH of COPD on home O2 who presented to the [facility] ED with worsening SOB and cough.  She was noted by her roommate to be having increasing difficulty and "not looking right," which prompted her to get evaluated.  She has not been taking all of her medications for the past week due to expense.  She notes that she is able to afford her medications, but that she will need to cut back on her output to do so.  During the last week she has noted worsening SOB, DOE, increase cough with worsening greenish sputum.  She has had a sick contact in her grandson and son who have had a URI.  She reports rhinorrhea, congestion and decreased PO intake as well over the past few days.    Further symptoms include a bilateral hand tremor since stopping her inhaler and some worsening memory issues.   She specifically denied chest pain, LE edema, orthopnea, N/V/D/C  She received breathing treatments in the ED with improvement of her symptoms.   Further PMH, PSH, meds, allergies as per resident note.   Physical Exam: Blood pressure 126/72, pulse 86, temperature 98.5 F (36.9 C), temperature source Oral, resp. rate 20, height _0  (1.651 m), weight 249 lb 5.4 oz (113.1 kg), SpO2 90.00%. General appearance: alert, cooperative and no distress Head: Normocephalic, without obvious abnormality, atraumatic Eyes: EOMI, anicteric sclerae Lungs: decraesed breath sounds at bases, no wheezing, mildly increased expiratory phase Heart: mild tachycardia, regular rhythm, no murmur noted Abdomen: soft, NT, ND, +BS Extremities: warm, trace edema Pulses: 2+ and symmetric Skin: dry, no rash  Lab results: Results for orders placed during the hospital  encounter of 02/05/12 (from the past 24 hour(s))  MRSA PCR SCREENING     Status: Abnormal   Collection Time   02/05/12  7:57 PM      Component Value Range   MRSA by PCR POSITIVE (*) NEGATIVE  URINALYSIS, ROUTINE W REFLEX MICROSCOPIC     Status: Abnormal   Collection Time   02/05/12  9:17 PM      Component Value Range   Color, Urine ORANGE (*) YELLOW   APPearance CLOUDY (*) CLEAR   Specific Gravity, Urine 1.025  1.005 - 1.030   pH 6.0  5.0 - 8.0   Glucose, UA NEGATIVE  NEGATIVE mg/dL   Hgb urine dipstick NEGATIVE  NEGATIVE   Bilirubin Urine LARGE (*) NEGATIVE   Ketones, ur 40 (*) NEGATIVE mg/dL   Protein, ur 30 (*) NEGATIVE mg/dL   Urobilinogen, UA >8.0 (*) 0.0 - 1.0 mg/dL   Nitrite NEGATIVE  NEGATIVE   Leukocytes, UA SMALL (*) NEGATIVE  URINE MICROSCOPIC-ADD ON     Status: Abnormal   Collection Time   02/05/12  9:17 PM      Component Value Range   Squamous Epithelial / LPF MANY (*) RARE   WBC, UA 3-6  <3 WBC/hpf   Bacteria, UA MANY (*) RARE   Casts HYALINE CASTS (*) NEGATIVE   Urine-Other LESS THAN 10 mL OF URINE SUBMITTED    COMPREHENSIVE METABOLIC PANEL     Status: Abnormal   Collection Time   02/06/12  4:35 AM  Component Value Range   Sodium 145  135 - 145 mEq/L   Potassium 3.5  3.5 - 5.1 mEq/L   Chloride 95 (*) 96 - 112 mEq/L   CO2 >45 (*) 19 - 32 mEq/L   Glucose, Bld 192 (*) 70 - 99 mg/dL   BUN 13  6 - 23 mg/dL   Creatinine, Ser 0.67  0.50 - 1.10 mg/dL   Calcium 9.4  8.4 - 10.5 mg/dL   Total Protein 6.9  6.0 - 8.3 g/dL   Albumin 3.1 (*) 3.5 - 5.2 g/dL   AST 13  0 - 37 U/L   ALT 11  0 - 35 U/L   Alkaline Phosphatase 105  39 - 117 U/L   Total Bilirubin 0.5  0.3 - 1.2 mg/dL   GFR calc non Af Amer >90  >90 mL/min   GFR calc Af Amer >90  >90 mL/min  CBC     Status: Normal   Collection Time   02/06/12  4:35 AM      Component Value Range   WBC 6.6  4.0 - 10.5 K/uL   RBC 4.39  3.87 - 5.11 MIL/uL   Hemoglobin 13.1  12.0 - 15.0 g/dL   HCT 42.3  36.0 - 46.0 %   MCV  96.4  78.0 - 100.0 fL   MCH 29.8  26.0 - 34.0 pg   MCHC 31.0  30.0 - 36.0 g/dL   RDW 13.0  11.5 - 15.5 %   Platelets 180  150 - 400 K/uL    Imaging results:  Dg Chest 2 View  02/05/2012  *RADIOLOGY REPORT*  Clinical Data: Cough, COPD, weakness  CHEST - 2 VIEW  Comparison: 01/12/2012  Findings: Stable cardiomegaly with vascular congestion and chronic interstitial changes, suspect related to chronic interstitial lung disease versus chronic edema.  Increased right base atelectasis versus scarring.  Blunting of the right costophrenic angle noted, a small effusion is not excluded.  No pneumothorax.  Trachea is midline.  Postop changes of the GE junction.  IMPRESSION: Cardiomegaly with chronic interstitial changes versus edema.  Increased right base atelectasis and probable small right effusion.   Original Report Authenticated By: Jerilynn Mages. Annamaria Boots, M.D.     Assessment and Plan: I agree with the formulated Assessment and Plan with the following changes:   1. COPD exacerbation - Exacerbated by recent inability to get inhaler, albuterol.  - CXR above not concerning for pneumonia, with some atelectasis on the right - Agree with nebs q6 hours, prednisone, azithromycin, O2 prn, IS to bedside, robitussin for cough - As she improved considerably with breathing treatment in ED and she does not appear acutely volume overloaded, CHF exacerbation less likely to be the main cause of her symptoms - SW/CM for assistance with obtaining medications.  - Counsel tobacco cessation  Other issues as per Dr. Sherrell Puller note  Sid Falcon, MD 11/4/20134:41 PM

## 2012-02-06 NOTE — Progress Notes (Signed)
Utilization Review Completed.

## 2012-02-06 NOTE — Plan of Care (Signed)
Problem: Discharge Progression Outcomes Goal: Discharge plan in place and appropriate Outcome: Completed/Met Date Met:  02/06/12 Pt D/C'd to home, pt on O2 at home, all follow up appointments, medications, and instructions given to pt, pt verbalized understanding, pt left via wheelchair, pt stable upon d/c

## 2012-02-07 ENCOUNTER — Other Ambulatory Visit: Payer: Self-pay | Admitting: *Deleted

## 2012-02-07 LAB — URINE CULTURE: Colony Count: 35000

## 2012-02-08 ENCOUNTER — Other Ambulatory Visit: Payer: Self-pay | Admitting: *Deleted

## 2012-02-08 DIAGNOSIS — J4489 Other specified chronic obstructive pulmonary disease: Secondary | ICD-10-CM

## 2012-02-08 DIAGNOSIS — J449 Chronic obstructive pulmonary disease, unspecified: Secondary | ICD-10-CM

## 2012-02-08 MED ORDER — RANITIDINE HCL 150 MG PO CAPS: 150.0000 mg | ORAL_CAPSULE | Freq: Every day | ORAL | Status: DC

## 2012-02-08 MED ORDER — ALBUTEROL SULFATE (5 MG/ML) 0.5% IN NEBU: 2.5000 mg | INHALATION_SOLUTION | Freq: Four times a day (QID) | RESPIRATORY_TRACT | Status: DC | PRN

## 2012-02-08 NOTE — Telephone Encounter (Signed)
Patient refills approved.

## 2012-02-08 NOTE — Telephone Encounter (Signed)
Pt experience called, heather, and states pt needs her refills. i called pt and she states when she was disch the md only filled one of her nebulizer meds and she needs a breathing treatment, reviewing her med list i see that only one was filled, i apologized and told her it would be called in today please approve and i will call it in

## 2012-02-09 MED ORDER — ALBUTEROL SULFATE (2.5 MG/3ML) 0.083% IN NEBU: 2.5000 mg | INHALATION_SOLUTION | Freq: Four times a day (QID) | RESPIRATORY_TRACT | Status: DC | PRN

## 2012-02-15 ENCOUNTER — Ambulatory Visit (INDEPENDENT_AMBULATORY_CARE_PROVIDER_SITE_OTHER): Payer: Medicare Other | Admitting: Internal Medicine

## 2012-02-15 ENCOUNTER — Encounter: Payer: Self-pay | Admitting: Internal Medicine

## 2012-02-15 VITALS — BP 142/83 | HR 86 | Temp 99.6°F | Ht 65.0 in | Wt 256.7 lb

## 2012-02-15 DIAGNOSIS — Z0000 Encounter for general adult medical examination without abnormal findings: Secondary | ICD-10-CM

## 2012-02-15 DIAGNOSIS — J449 Chronic obstructive pulmonary disease, unspecified: Secondary | ICD-10-CM

## 2012-02-15 MED ORDER — TIOTROPIUM BROMIDE MONOHYDRATE 18 MCG IN CAPS: 18.0000 ug | ORAL_CAPSULE | Freq: Every day | RESPIRATORY_TRACT | Status: DC

## 2012-02-15 NOTE — Assessment & Plan Note (Addendum)
I did not address health maintenance issues at this visit except to discuss them with the patient and make her aware of them.  She says she has an appointment for an annual physical coming up in December with her PCP and we decided it would be best to defer these decision to that visit.  Her influenza vaccination is up to date. 1.   Health maintenance report shows last mammogram on Sep 02 2010, but I do not see one on that date.  I do see a screening mammogram in March 2012 that was BIRADS-0.  She needs to follow up with at least another screening mammogram soon. 2.   Consider referral for screening coloscopy at next visit. 3.   I do not see a pneumococcal vaccine documented, but she is eligible for one with her COPD. 4.   Tdap vaccination needed. 5.   Her last pap smear was around 5 years ago.  She has had a subtotal hysterectomy but still has her cervix, so cervical cancer screening is still indicated. 6.   Consider repeating a lipid panel.

## 2012-02-15 NOTE — Progress Notes (Signed)
  Subjective:    Patient ID: [name], female    DOB: 01/24/1956, 56 y.o.   MRN: [id number]  CC: COPD exacerbation.  HPI: This is a 56 year old woman with hyperlipidemia, hypertension, tobacco abuse, heart failure, COPD, and obesity.  She presents to the clinic today for hospital follow-up after a recent hospitalization for a COPD exacerbation.  She was admitted on November 3 and discharged November 4.  She was discharged home on a course of Bactrim which she has finished and a taper of prednisone, which she has 3 days left.  The COPD regimen she was sent home on was albuterol and ipratropium nebulized solutions as needed (albuterol q6hr, ipratropium TID) and fluticasone-salmeterol 1 puff daily.  She has some confusion with the pharmacy.  It seems she has numerous refills of the albuterol solution and only 1 remaining refill of the ipratropium solution.  When she went to pick up her medicines, she got 5 boxes of albuterol solution but only 1 box of ipratropium solution.  It was her understanding that the two should be taken together, which obviously could not happen the rest of the month - she will run out of ipratropium soon.  She is strapped for money; in fact, she reports having only $5 to her name and does not receive her next check until early December.  As far as the exacerbation goes, she is improving.  Her breathing is not quite back to baseline, but it is much improved.  Her cough has not resolved, but it too is much improved.  Sputum purulence has cleared; her cough is now producing a clear phlegm.  She denies chest pain, palpitations, and peripheral edema.   Review of Systems  All other systems reviewed and are negative.       Objective:   Physical Exam GENERAL: obese, in no distress LUNGS: normal work of breathing, diminished breath sounds in all fields, no rales or wheezes HEART: normal rate and regular rhythm; normal S1 and S2 without S3 or S4; no murmurs, rubs, or  clicks   Filed Vitals:   02/15/12 1515  BP:   Pulse: 86  Temp:         Assessment & Plan:

## 2012-02-15 NOTE — Patient Instructions (Addendum)
Continue using your ipratropium and albuterol nebulizer solution as you have been until your ipratropium runs out.  Then, begin using tiotropium (Spiriva) once a day.  Continue using your albuterol nebulizer solution alone from then on.  After your next paycheck, you will be able to pick up tiotropium from the pharmacy.  You should not need to use ipratropium at home anymore.

## 2012-02-15 NOTE — Assessment & Plan Note (Addendum)
Severe, gold stage IV.  Improving from recent exacerbation.  She needs to be on a long acting anticholinergic, a long acting beta-agonist, an inhaled corticosteroid, and short acting beta-agonist as needed.  Currently, she is lacking a long acting anticholinergic, but is instead on ipratropium as needed.  Because of her financial situation, we will have suboptimal coverage until December.  In an attempt to bridge the gap between now and her next check, I have instructed her to continue using ipratropium nebulized solution as she has been doing for the next few days.  Once she runs out of ipratropium, I have instructed her to begin daily tiotropium.  I have given her a sample of tiotropium today and sent over a rx for tiotropium to her pharmacy so that she can pick this up when she gets her check in early December.  At that point, she will be on a more appropriate regimen of daily fluticasone-salmeterol, daily tiotropium, and nebulized albuterol as needed. 1.   finish ipratropium then start daily inhaled 49mg tiotropium 2.   continue daily fluticasone-salmeterol 250-50, 1 puff once daily -- consider increasing to BID at next visit 3.   continue nebulized albuterol every six hours as needed 4.   finish last 3 days of prednisone taper

## 2012-03-09 ENCOUNTER — Ambulatory Visit: Payer: Medicare Other | Admitting: Internal Medicine

## 2012-05-06 IMAGING — CR DG CHEST 2V
2 series · 2 of 2 positions shown · non-contrast
Comparison: 03/03/2009

CLINICAL DATA: Shortness of breath.

CHEST - 2 VIEW

[w chest pa]
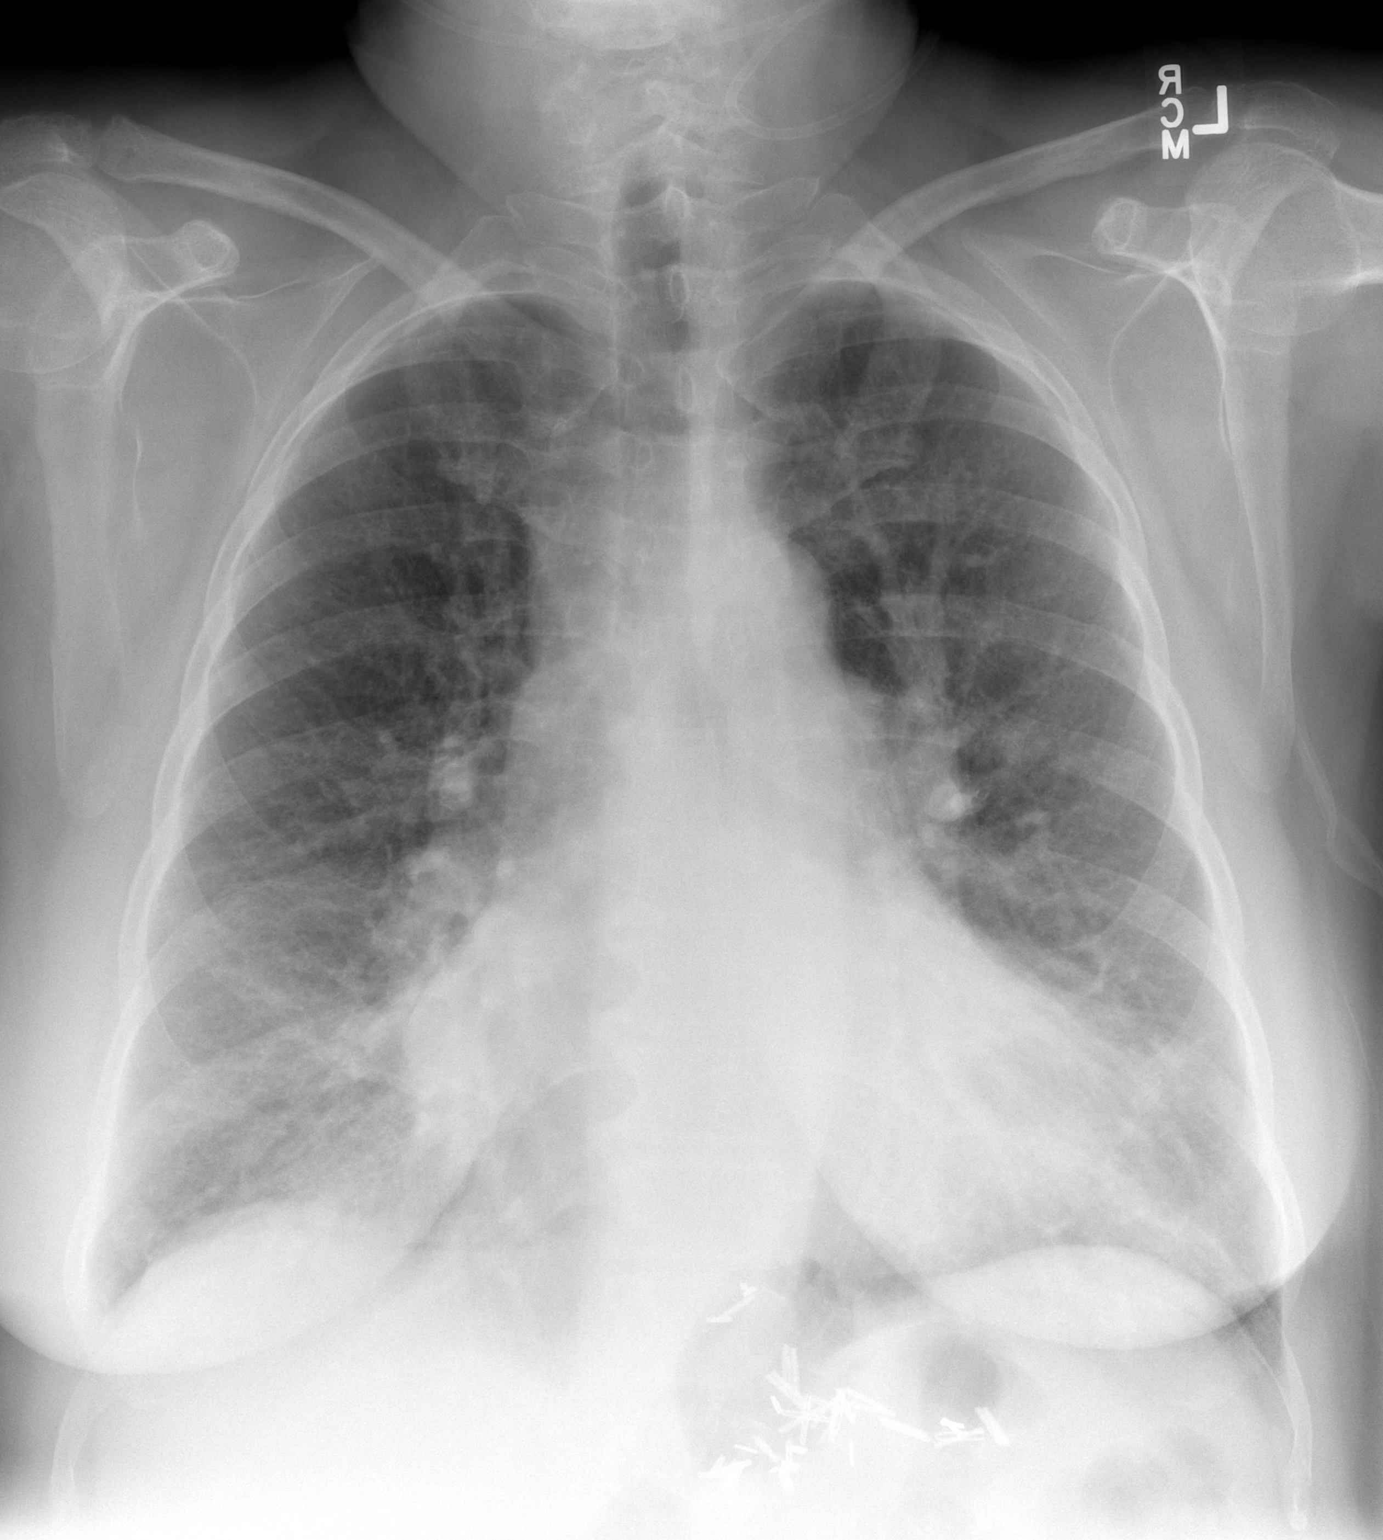

[w chest lat]
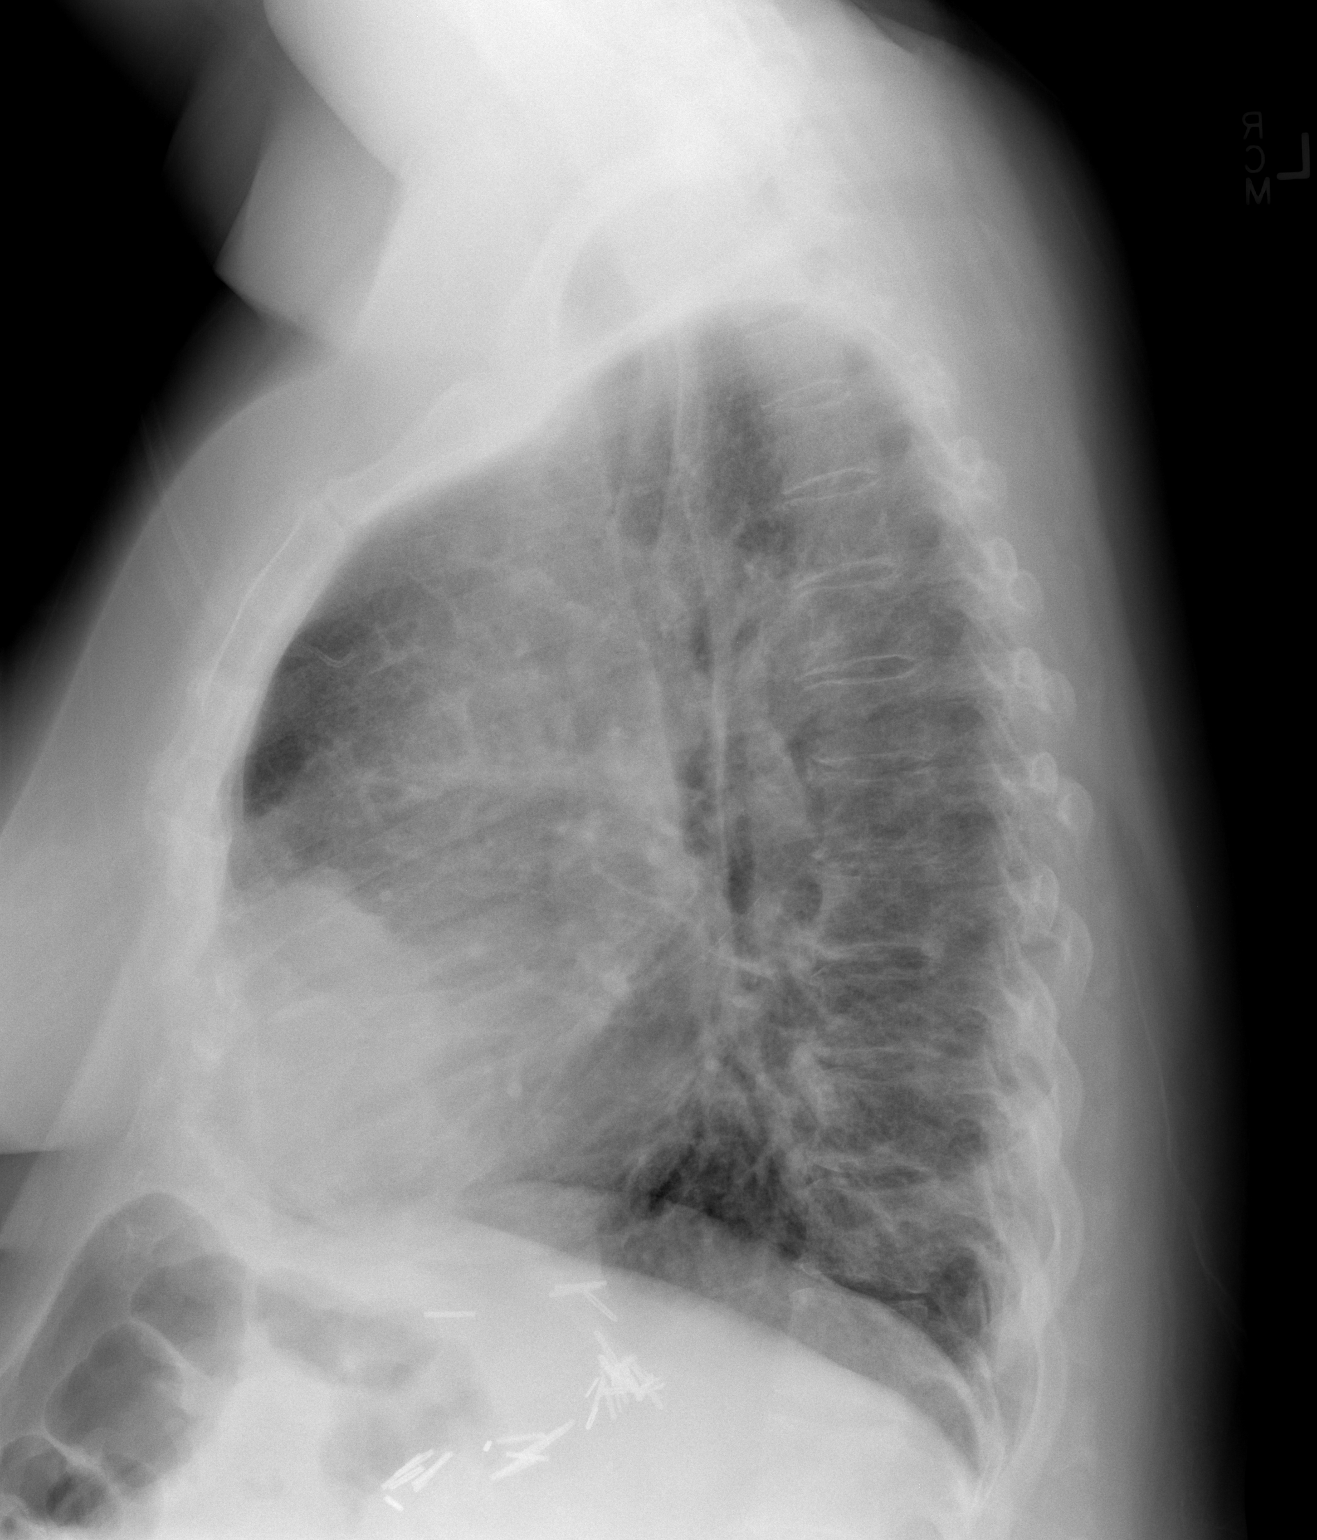

[2 of 2 positions shown; findings below may reference images not displayed]

FINDINGS: Cardiomegaly is identified.
Pulmonary vascular congestion is present.  Mild increased
interstitial opacities are present and may represent mild
interstitial edema or interstitial infection.
No definite focal airspace disease, pleural effusions, or
pneumothorax noted.
No acute bony abnormalities are identified.
Surgical clips overlying the upper abdomen are again identified.
IMPRESSION: Cardiomegaly with pulmonary vascular congestion and probable mild
interstitial edema versus interstitial infection.

## 2012-05-24 ENCOUNTER — Encounter: Payer: Self-pay | Admitting: Internal Medicine

## 2012-06-06 ENCOUNTER — Encounter: Payer: Self-pay | Admitting: Internal Medicine

## 2012-06-06 ENCOUNTER — Ambulatory Visit (INDEPENDENT_AMBULATORY_CARE_PROVIDER_SITE_OTHER): Payer: Medicare Other | Admitting: Internal Medicine

## 2012-06-06 VITALS — BP 147/81 | HR 65 | Temp 98.6°F | Ht 65.0 in | Wt 261.5 lb

## 2012-06-06 DIAGNOSIS — R0602 Shortness of breath: Secondary | ICD-10-CM

## 2012-06-06 MED ORDER — FLUTICASONE-SALMETEROL 250-50 MCG/DOSE IN AEPB: 1.0000 | INHALATION_SPRAY | Freq: Two times a day (BID) | RESPIRATORY_TRACT | Status: DC

## 2012-06-06 MED ORDER — IPRATROPIUM BROMIDE 0.02 % IN SOLN: 250.0000 ug | Freq: Three times a day (TID) | RESPIRATORY_TRACT | Status: DC

## 2012-06-06 MED ORDER — ALBUTEROL SULFATE HFA 108 (90 BASE) MCG/ACT IN AERS: 2.0000 | INHALATION_SPRAY | Freq: Four times a day (QID) | RESPIRATORY_TRACT | Status: DC | PRN

## 2012-06-06 MED ORDER — ALBUTEROL SULFATE (2.5 MG/3ML) 0.083% IN NEBU: 2.5000 mg | INHALATION_SOLUTION | Freq: Four times a day (QID) | RESPIRATORY_TRACT | Status: DC | PRN

## 2012-06-06 MED ORDER — PRAVASTATIN SODIUM 40 MG PO TABS: 40.0000 mg | ORAL_TABLET | Freq: Every day | ORAL | Status: DC

## 2012-06-06 NOTE — Progress Notes (Signed)
Subjective:     Patient ID: [name], female   DOB: 1955-10-23, 57 y.o.   MRN: [id number]  Cough Associated symptoms include headaches and shortness of breath. Pertinent negatives include no chest pain, chills, fever or wheezing.  Headache  Associated symptoms include coughing. Pertinent negatives include no abdominal pain, dizziness, fever, nausea, numbness, seizures, vomiting or weakness.   The patient is a 57 YO woman with PMH of COPD, GERD who is on 4 L O2 at home all the time. She comes in today with more problems breathing. She states she is out of her medicines except advair. She has not had albuterol or atrovent inhalers or nebulizers in some time. She is not having more cough or sputum production. She is able to ambulate without stopping to catch her breath for at least 100 feet. She is not taking any medicines currently except advair. She continues to smoke about 1/2 to 1 PPD and states that she does not have money for her medicines.   Review of Systems  Constitutional: Positive for activity change. Negative for fever, chills, diaphoresis, appetite change, fatigue and unexpected weight change.       Weights are stable. Less activity since breathing is not as good.  Respiratory: Positive for cough and shortness of breath. Negative for apnea, choking, chest tightness, wheezing and stridor.        Cough chronic and unchanged. O2 saturation on ambulation remained 91%.   Cardiovascular: Negative for chest pain, palpitations and leg swelling.  Gastrointestinal: Negative for nausea, vomiting, abdominal pain, diarrhea and constipation.  Musculoskeletal: Negative.   Skin: Negative.   Neurological: Positive for headaches. Negative for dizziness, tremors, seizures, syncope, facial asymmetry, speech difficulty, weakness, light-headedness and numbness.  Psychiatric/Behavioral: Negative.        Objective:   Physical Exam  Constitutional: She is oriented to person, place, and time. She  appears well-developed and well-nourished. No distress.  Slightly obese  HENT:  Head: Normocephalic and atraumatic.  Eyes: Conjunctivae are normal. Pupils are equal, round, and reactive to light.  Neck: Normal range of motion. Neck supple.  Cardiovascular: Normal rate and normal heart sounds.   No murmur heard. Pulmonary/Chest: Effort normal. No respiratory distress. She has no wheezes. She has no rales. She exhibits no tenderness.  Diminished breath sounds overall but no focal wheezing, rales, crackles.   Abdominal: Soft. Bowel sounds are normal. She exhibits no distension. There is no tenderness. There is no rebound.  Musculoskeletal: Normal range of motion. She exhibits no edema and no tenderness.  Neurological: She is alert and oriented to person, place, and time. No cranial nerve deficit.  Skin: Skin is warm and dry. She is not diaphoretic.       Assessment/Plan:   1. SOB - Likely secondary to non-compliance with COPD regimen. Not likely to be exacerbation and will not rx antibiotic or steroid at this time. Refilled all of her medications and advised her on their use. Advised her to call or return if breathing does not improve with resumption of her medications. Strongly advised smoking cessation and spent about 5-10 minutes in consultation explaining side effects however she does not wish to quit at this time.  2. Disposition - Refilled albuterol, spiriva, advair, ipratroprium and albuterol nebulized and pravachol. She can return in 2-3 months or sooner if symptoms do not alleviate or worsen.

## 2012-06-06 NOTE — Patient Instructions (Signed)
We will have you restart your nebulizer with albuterol and atrovent (ipratroprium) as needed. We would like you to use albuterol inhaler as needed.   You need to STOP SMOKING to improve your lungs. Your lungs will continue to decline and get worse and worse with the smoking.   Come back if your breathing gets worse or does not improve when you restart your nebulizers. Otherwise come back in 2-3 months to see your regular doctor.   Our number is [phone].

## 2012-07-06 ENCOUNTER — Encounter: Payer: Medicare Other | Admitting: Internal Medicine

## 2012-08-11 ENCOUNTER — Emergency Department (HOSPITAL_COMMUNITY): Payer: Medicare Other

## 2012-08-11 ENCOUNTER — Encounter (HOSPITAL_COMMUNITY): Payer: Self-pay | Admitting: *Deleted

## 2012-08-11 ENCOUNTER — Emergency Department (HOSPITAL_COMMUNITY)
Admission: EM | Admit: 2012-08-11 | Discharge: 2012-08-11 | Disposition: A | Discharge status: Home / Self Care | Payer: Medicare Other | Attending: Emergency Medicine | Admitting: Emergency Medicine

## 2012-08-11 DIAGNOSIS — Z8679 Personal history of other diseases of the circulatory system: Secondary | ICD-10-CM | POA: Insufficient documentation

## 2012-08-11 DIAGNOSIS — J159 Unspecified bacterial pneumonia: Secondary | ICD-10-CM | POA: Insufficient documentation

## 2012-08-11 DIAGNOSIS — I2789 Other specified pulmonary heart diseases: Secondary | ICD-10-CM | POA: Insufficient documentation

## 2012-08-11 DIAGNOSIS — R0989 Other specified symptoms and signs involving the circulatory and respiratory systems: Secondary | ICD-10-CM | POA: Insufficient documentation

## 2012-08-11 DIAGNOSIS — E785 Hyperlipidemia, unspecified: Secondary | ICD-10-CM | POA: Insufficient documentation

## 2012-08-11 DIAGNOSIS — E669 Obesity, unspecified: Secondary | ICD-10-CM | POA: Insufficient documentation

## 2012-08-11 DIAGNOSIS — Z8709 Personal history of other diseases of the respiratory system: Secondary | ICD-10-CM | POA: Insufficient documentation

## 2012-08-11 DIAGNOSIS — Z79899 Other long term (current) drug therapy: Secondary | ICD-10-CM | POA: Insufficient documentation

## 2012-08-11 DIAGNOSIS — I2510 Atherosclerotic heart disease of native coronary artery without angina pectoris: Secondary | ICD-10-CM | POA: Insufficient documentation

## 2012-08-11 DIAGNOSIS — F17200 Nicotine dependence, unspecified, uncomplicated: Secondary | ICD-10-CM | POA: Insufficient documentation

## 2012-08-11 DIAGNOSIS — J4489 Other specified chronic obstructive pulmonary disease: Secondary | ICD-10-CM | POA: Insufficient documentation

## 2012-08-11 DIAGNOSIS — J189 Pneumonia, unspecified organism: Secondary | ICD-10-CM

## 2012-08-11 DIAGNOSIS — R059 Cough, unspecified: Secondary | ICD-10-CM | POA: Insufficient documentation

## 2012-08-11 DIAGNOSIS — Z8614 Personal history of Methicillin resistant Staphylococcus aureus infection: Secondary | ICD-10-CM | POA: Insufficient documentation

## 2012-08-11 DIAGNOSIS — J449 Chronic obstructive pulmonary disease, unspecified: Secondary | ICD-10-CM | POA: Insufficient documentation

## 2012-08-11 DIAGNOSIS — R05 Cough: Secondary | ICD-10-CM | POA: Insufficient documentation

## 2012-08-11 DIAGNOSIS — Z8719 Personal history of other diseases of the digestive system: Secondary | ICD-10-CM | POA: Insufficient documentation

## 2012-08-11 LAB — COMPREHENSIVE METABOLIC PANEL
ALT: 13 U/L (ref 0–35)
AST: 10 U/L (ref 0–37)
Albumin: 3.5 g/dL (ref 3.5–5.2)
Alkaline Phosphatase: 114 U/L (ref 39–117)
BUN: 9 mg/dL (ref 6–23)
CO2: 41 mEq/L (ref 19–32)
Calcium: 9 mg/dL (ref 8.4–10.5)
Chloride: 97 mEq/L (ref 96–112)
Creatinine, Ser: 0.73 mg/dL (ref 0.50–1.10)
GFR calc Af Amer: >90 mL/min (ref >90)
GFR calc non Af Amer: >90 mL/min (ref >90)
Glucose, Bld: 61 mg/dL — ABNORMAL LOW (ref 70–99)
Potassium: 3.9 mEq/L (ref 3.5–5.1)
Sodium: 141 mEq/L (ref 135–145)
Total Bilirubin: 0.3 mg/dL (ref 0.3–1.2)
Total Protein: 6.8 g/dL (ref 6.0–8.3)

## 2012-08-11 LAB — CBC WITH DIFFERENTIAL/PLATELET
Basophils Absolute: 0.1 10*3/uL (ref 0.0–0.1)
Basophils Relative: 1 % (ref 0–1)
Eosinophils Absolute: 0.3 10*3/uL (ref 0.0–0.7)
Eosinophils Relative: 4 % (ref 0–5)
HCT: 42.9 % (ref 36.0–46.0)
Hemoglobin: 13.8 g/dL (ref 12.0–15.0)
Lymphocytes Relative: 27 % (ref 12–46)
Lymphs Abs: 2.1 10*3/uL (ref 0.7–4.0)
MCH: 30.3 pg (ref 26.0–34.0)
MCHC: 32.2 g/dL (ref 30.0–36.0)
MCV: 94.1 fL (ref 78.0–100.0)
Monocytes Absolute: 0.9 10*3/uL (ref 0.1–1.0)
Monocytes Relative: 12 % (ref 3–12)
Neutro Abs: 4.4 10*3/uL (ref 1.7–7.7)
Neutrophils Relative %: 57 % (ref 43–77)
Platelets: 168 10*3/uL (ref 150–400)
RBC: 4.56 MIL/uL (ref 3.87–5.11)
RDW: 13.6 % (ref 11.5–15.5)
WBC: 7.7 10*3/uL (ref 4.0–10.5)

## 2012-08-11 MED ORDER — DOXYCYCLINE HYCLATE 100 MG PO CAPS: 100.0000 mg | ORAL_CAPSULE | Freq: Two times a day (BID) | ORAL | Status: DC

## 2012-08-11 MED ORDER — ALBUTEROL SULFATE HFA 108 (90 BASE) MCG/ACT IN AERS: 2.0000 | INHALATION_SPRAY | RESPIRATORY_TRACT | Status: DC | PRN

## 2012-08-11 MED ORDER — PREDNISONE 20 MG PO TABS: ORAL_TABLET | ORAL | Status: DC

## 2012-08-11 NOTE — ED Provider Notes (Signed)
History     CSN: [id number]  Arrival date & time 08/11/12  1726   First MD Initiated Contact with Patient 08/11/12 1949      Chief Complaint  Patient presents with  . Shortness of Breath    (Consider location/radiation/quality/duration/timing/severity/associated sxs/prior treatment) HPI This 57 year old female has several days of coughing with shortness time she's not sure breath right now and does not want a nebulizer now, she has home nebulizers as needed, she has been using her albuterol nebulizer more often than usual over the last several days, she is no fever no confusion no chest pain no abdominal pain no vomiting or diarrhea. She tends to cough and wheeze more so at night with improvement with her nebulizer.She is slight edema to her ankles. Past Medical History  Diagnosis Date  . Coronary artery disease     S/P PCI of LAD with DES (12/2008). Total occlusion of RCA noted at that time., medically managed. ACS ruled out 03/2009 with Lexiscan myoview . Followed by [facility].  . Pulmonary hypertension     2-D Echo (33/8250) - Systolic pressure was moderately increased. PA peak pressure  17mHg. 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 Followed by Dr. WJoya Gaskins([facility] Heckler  . Diastolic dysfunction     2-D Echo (12/2008) - Normal LV Systolic funciton with EF 60-65%. Grade 1 diastolid dysfunction. No regional wall motion abnormalities. Moderate pulmonary HTN with PA peak pressure 566mg.  . Marland KitchenOPD (chronic obstructive pulmonary disease)     Severe. Gold Stage IV.  PFTs (12/2008) - severe obstructive airway disease. Active tobacco use. Requires 4L O2 at home.  . Pulmonary nodule, right     Small right middle lobe nodule. Stable as of 12/2008.  . Marland Kitchenleep apnea     Presumptive. No documented sleep studies.   . Prediabetes 12/2008    HgbA1c 6.4 (12/2008)  . Hx MRSA infection     Recurrent  MRSA thigh abscesses.  . Tobacco abuse     Ongoing.  . Obesity   . Hyperlipidemia   . GERD (gastroesophageal reflux disease)     S/P Nissen fundoplication.  . Shortness of breath     Past Surgical History  Procedure Laterality Date  . Total abdominal hysterectomy w/ bilateral salpingoophorectomy    . Nissen fundoplication      Family History  Problem Relation Age of Onset  . Heart disease Mother 4728  Deceased from MI at 58yo. Hypertension Mother   . Heart disease Father 5470  Deceased of MI age 58yo. Hypertension Father   . Hypertension Brother   . Lung cancer      Grandmother    History  Substance Use Topics  . Smoking status: Current Every Day Smoker -- 1.00 packs/day    Types: Cigarettes  . Smokeless tobacco: Never Used  . Alcohol Use: No    OB History   Grav Para Term Preterm Abortions TAB SAB Ect Mult Living                  Review of Systems 10 Systems reviewed and are negative for acute change except as noted in the HPI. Allergies  Fluconazole  Home Medications   Current Outpatient Rx  Name  Route  Sig  Dispense  Refill  . Aspirin-Salicylamide-Caffeine (BC HEADACHE POWDER PO)   Oral   Take 1 packet by mouth daily  as needed. For pain         . albuterol (PROVENTIL HFA;VENTOLIN HFA) 108 (90 BASE) MCG/ACT inhaler   Inhalation   Inhale 2 puffs into the lungs every 2 (two) hours as needed for wheezing or shortness of breath (cough).   1 Inhaler   0   . doxycycline (VIBRAMYCIN) 100 MG capsule   Oral   Take 1 capsule (100 mg total) by mouth 2 (two) times daily. One po bid x 7 days   14 capsule   0   . predniSONE (DELTASONE) 20 MG tablet      3 tabs po day one, then 2 po daily x 4 days   11 tablet   0     BP 160/98  Pulse 70  Temp(Src) 99 F (37.2 C) (Oral)  Resp 20  SpO2 92%  Physical Exam  Nursing note and vitals reviewed. Constitutional:  Awake, alert, nontoxic appearance.  HENT:  Head: Atraumatic.  Eyes: Right eye  exhibits no discharge. Left eye exhibits no discharge.  Neck: Neck supple.  Cardiovascular: Normal rate and regular rhythm.   No murmur heard. Pulmonary/Chest: Effort normal. No respiratory distress. She has no wheezes. She has rales. She exhibits no tenderness.  Lungs essentially clear except for a few bibasilar crackles  Abdominal: Soft. There is no tenderness. There is no rebound.  Musculoskeletal: She exhibits no tenderness.  Baseline ROM, no obvious new focal weakness. Trace edema lower legs.  Neurological:  Mental status and motor strength appears baseline for patient and situation.  Skin: No rash noted.  Psychiatric: She has a normal mood and affect.    ED Course  Procedures (including critical care time)  Clinically I suspect pulmonary infection with COPD exacerbation and not heart failure or ACS (BNP only mildly elevated). She is on home oxygen at 4 L per minute at baseline with pulse oximetry normal at 95% with her baseline supplemental oxygen.  Labs Reviewed  COMPREHENSIVE METABOLIC PANEL - Abnormal; Notable for the following:    CO2 41 (*)    Glucose, Bld 61 (*)    All other components within normal limits  PRO B NATRIURETIC PEPTIDE - Abnormal; Notable for the following:    Pro B Natriuretic peptide (BNP) 275.0 (*)    All other components within normal limits  CBC WITH DIFFERENTIAL  POCT I-STAT TROPONIN I   Dg Chest 2 View  08/11/2012  *RADIOLOGY REPORT*  Clinical Data: Shortness of breath.  Cough and congestion.  CHEST - 2 VIEW  Comparison: Two-view chest 02/05/2012.  Findings: The heart is enlarged.  Pulmonary vascular congestion has increased.  Bibasilar airspace disease is worse right than left. Small bilateral pleural effusions are suspected.  IMPRESSION:  1.  Cardiomegaly and increased pulmonary vascular congestion is worrisome for congestive heart failure. 2.  Bibasilar airspace disease, right greater than left.  Although this may represent atelectasis, infection is  not excluded.   Original Report Authenticated By: San Morelle, M.D.      1. CAP (community acquired pneumonia)   2. COPD (chronic obstructive pulmonary disease)       MDM  Patient informed of clinical course, understand medical decision-making process, and agree with plan.I doubt any other EMC precluding discharge at this time including, but not necessarily limited to the following:sepsis.        Babette Relic, MD 08/11/12 2255

## 2012-08-11 NOTE — ED Notes (Signed)
Pt in c/o increased shortness of breath, states she has a history of COPD and she has noted increased shortness of breath over last few days with exertion. Pt on home O2, speaking in full sentences in triage. Denies chest pain. Bilateral ankle swelling noted.

## 2012-08-24 ENCOUNTER — Ambulatory Visit: Payer: Medicare Other | Admitting: Internal Medicine

## 2012-08-26 IMAGING — CR DG CHEST 2V
2 series · 2 of 2 positions shown · non-contrast
Comparison: None.

CLINICAL DATA: Chest pain and shortness of breath.  COPD.

CHEST - 2 VIEW

[w chest pa]
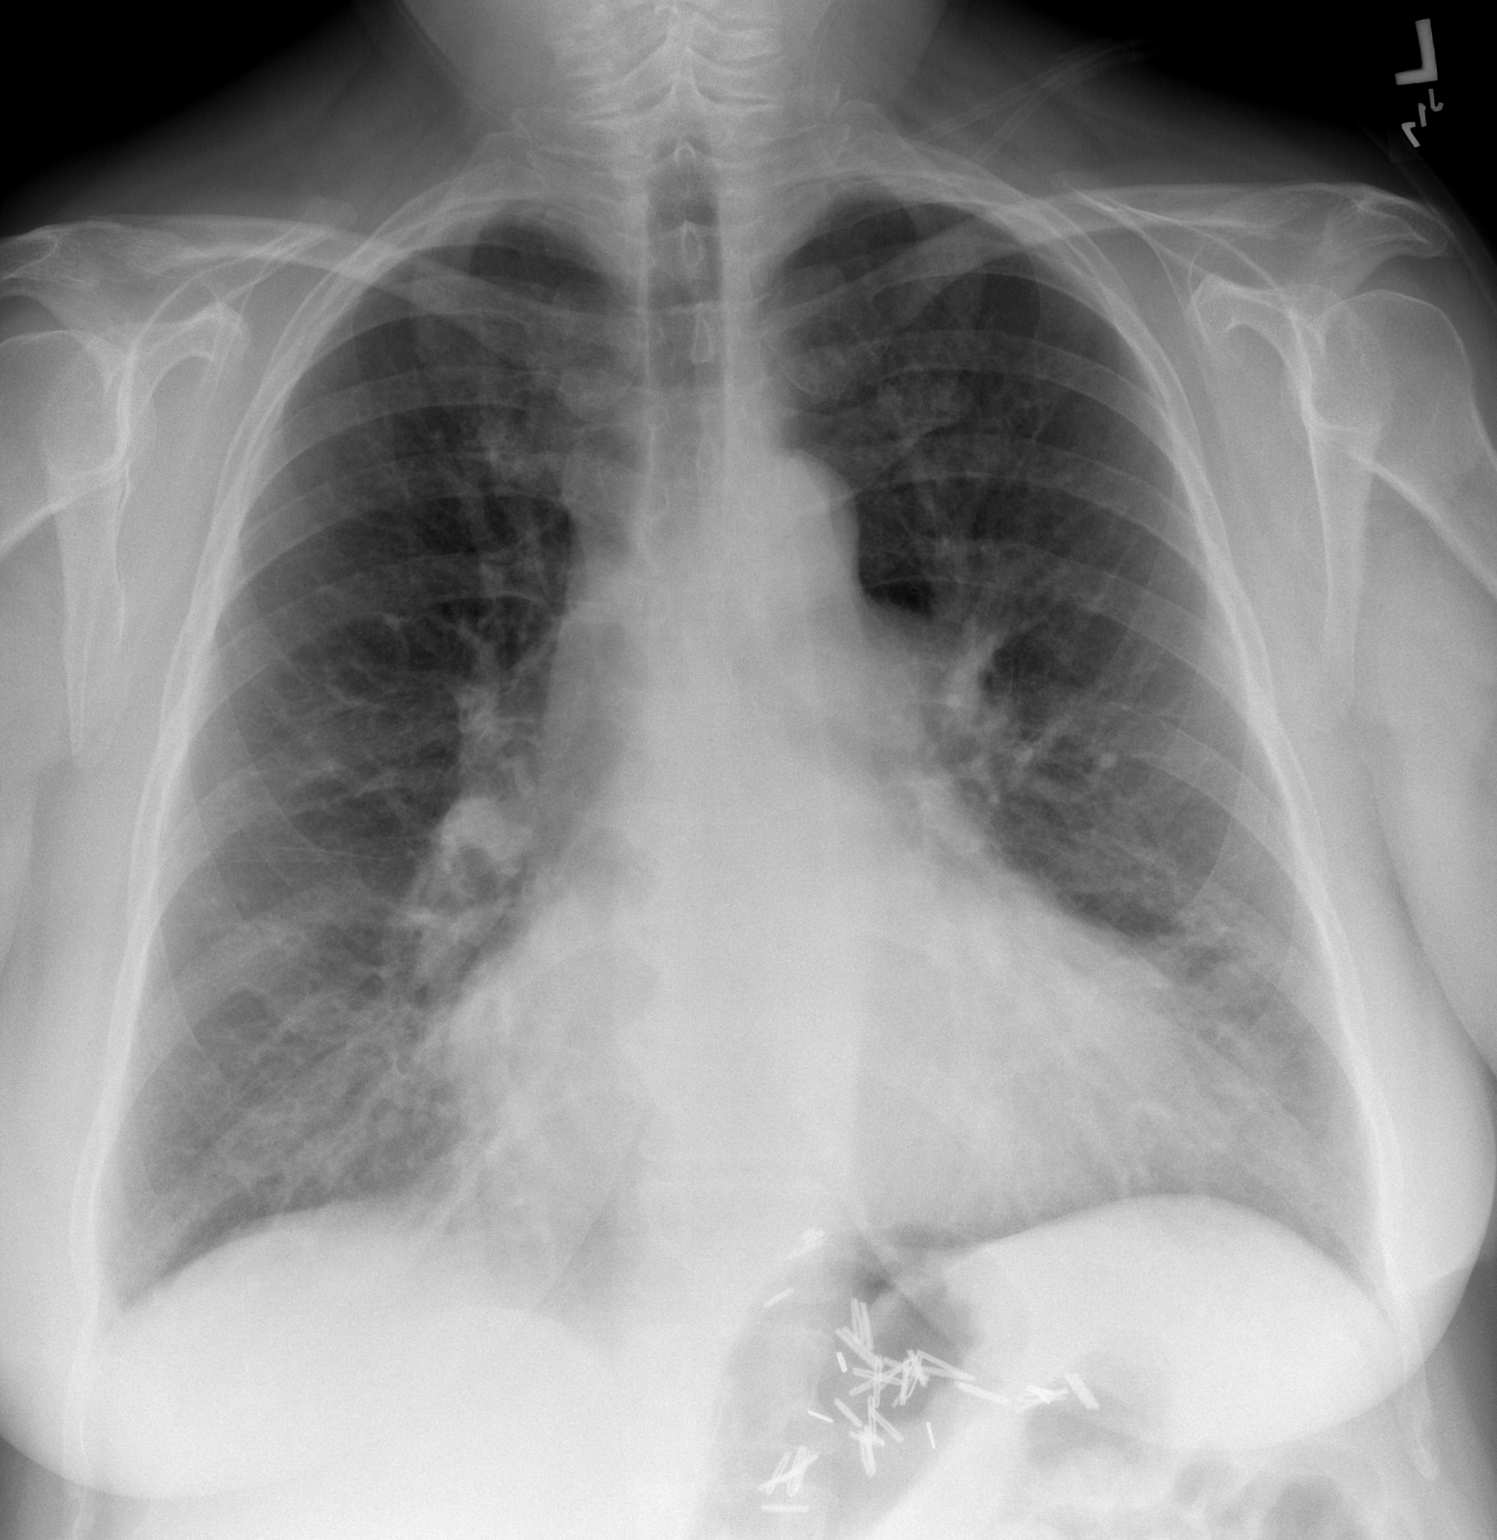

[w chest lat]
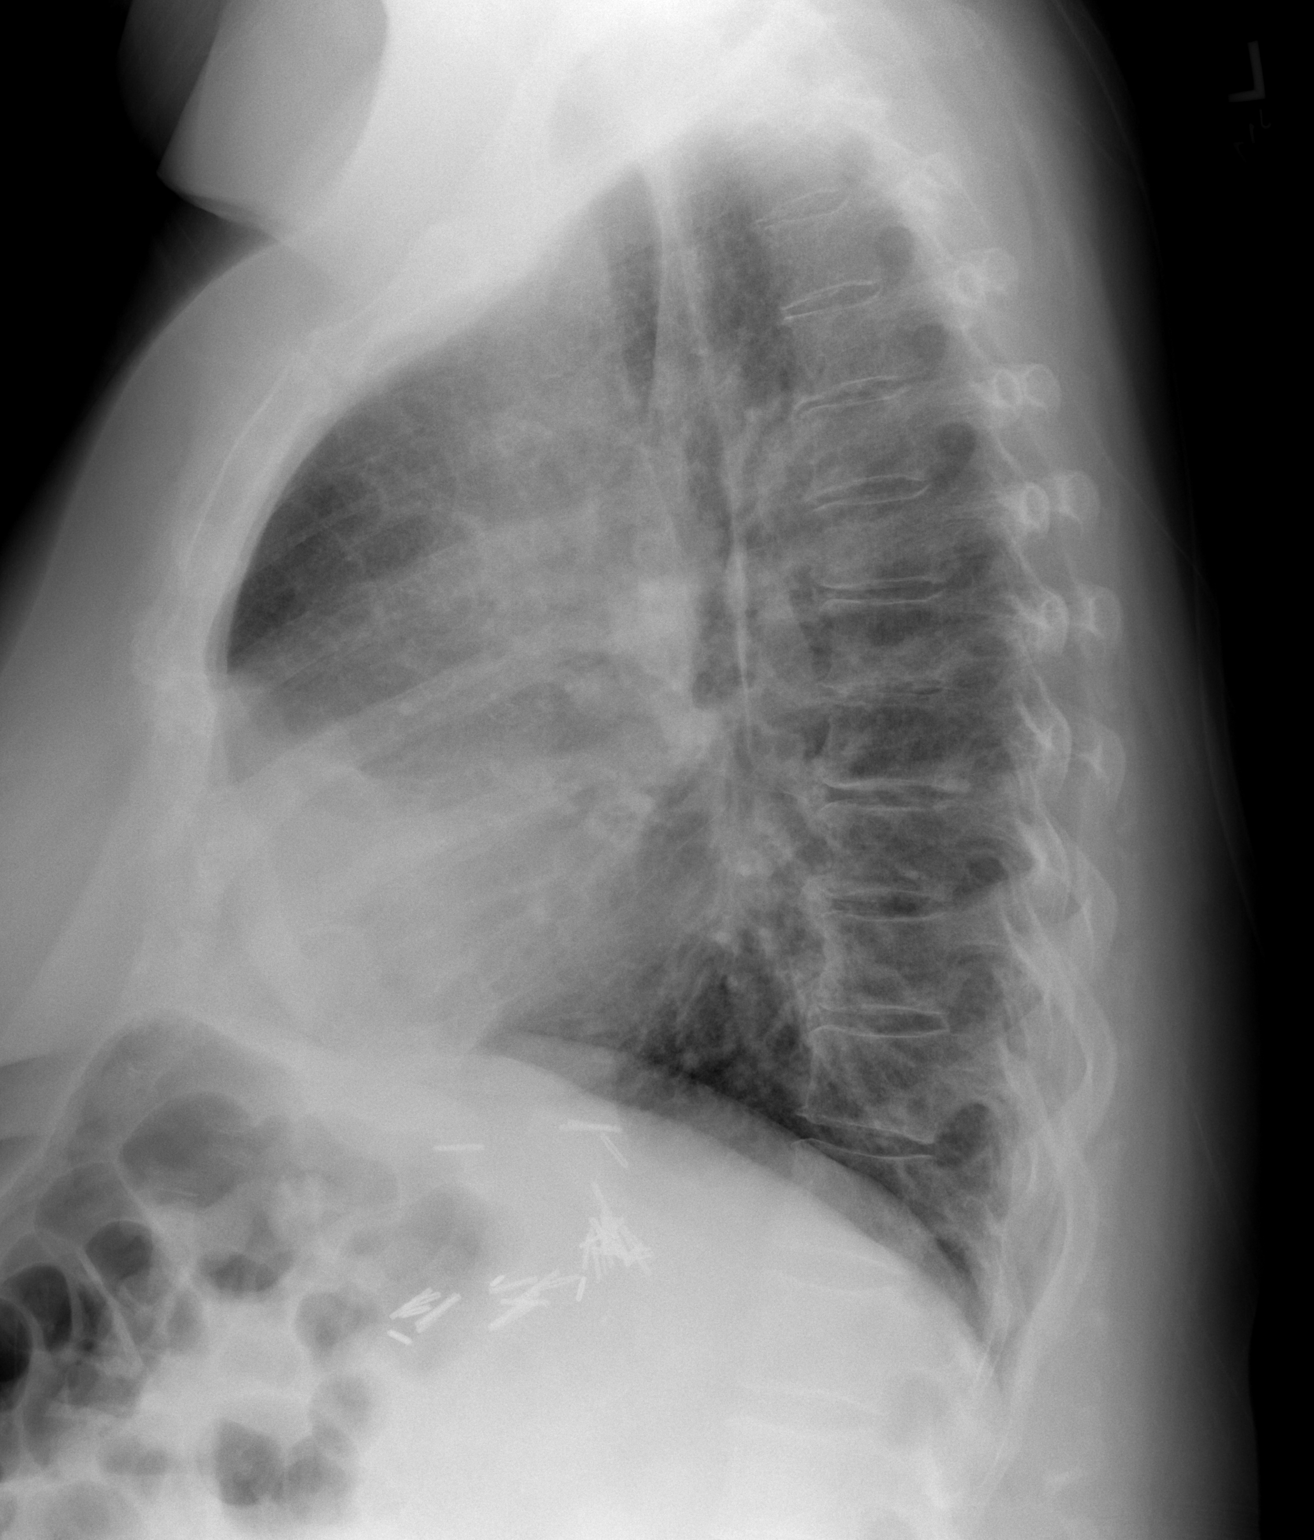

[2 of 2 positions shown; findings below may reference images not displayed]

FINDINGS: Cardiac enlargement is present.  Mild vascular
congestion.  Mild peribronchial  thickening suggesting chronic
bronchitis.  No overt failure or infiltrates.  Improved aeration
from priors.  Surgical clips surround the GE junction and are
stable.  Degenerative change thoracic spine.   No compression
fracture.
IMPRESSION: Cardiac enlargement with mild vascular congestion.  No definite
active infiltrates or failure.

## 2012-11-05 ENCOUNTER — Emergency Department (HOSPITAL_COMMUNITY): Payer: Medicare Other

## 2012-11-05 ENCOUNTER — Inpatient Hospital Stay (HOSPITAL_COMMUNITY)
Admission: EM | Admit: 2012-11-05 | Discharge: 2012-11-09 | DRG: 286 | Disposition: A | Discharge status: Home / Self Care | Payer: Medicare Other | Attending: Internal Medicine | Admitting: Internal Medicine

## 2012-11-05 ENCOUNTER — Encounter (HOSPITAL_COMMUNITY): Payer: Self-pay | Admitting: *Deleted

## 2012-11-05 DIAGNOSIS — Z9119 Patient's noncompliance with other medical treatment and regimen: Secondary | ICD-10-CM

## 2012-11-05 DIAGNOSIS — Z8614 Personal history of Methicillin resistant Staphylococcus aureus infection: Secondary | ICD-10-CM

## 2012-11-05 DIAGNOSIS — I5033 Acute on chronic diastolic (congestive) heart failure: Principal | ICD-10-CM

## 2012-11-05 DIAGNOSIS — J449 Chronic obstructive pulmonary disease, unspecified: Secondary | ICD-10-CM | POA: Diagnosis present

## 2012-11-05 DIAGNOSIS — I5030 Unspecified diastolic (congestive) heart failure: Secondary | ICD-10-CM

## 2012-11-05 DIAGNOSIS — Z91199 Patient's noncompliance with other medical treatment and regimen due to unspecified reason: Secondary | ICD-10-CM

## 2012-11-05 DIAGNOSIS — F17200 Nicotine dependence, unspecified, uncomplicated: Secondary | ICD-10-CM

## 2012-11-05 DIAGNOSIS — E662 Morbid (severe) obesity with alveolar hypoventilation: Secondary | ICD-10-CM | POA: Diagnosis present

## 2012-11-05 DIAGNOSIS — J9620 Acute and chronic respiratory failure, unspecified whether with hypoxia or hypercapnia: Secondary | ICD-10-CM | POA: Diagnosis present

## 2012-11-05 DIAGNOSIS — Z9981 Dependence on supplemental oxygen: Secondary | ICD-10-CM

## 2012-11-05 DIAGNOSIS — Z720 Tobacco use: Secondary | ICD-10-CM | POA: Diagnosis present

## 2012-11-05 DIAGNOSIS — E785 Hyperlipidemia, unspecified: Secondary | ICD-10-CM | POA: Diagnosis present

## 2012-11-05 DIAGNOSIS — J189 Pneumonia, unspecified organism: Secondary | ICD-10-CM | POA: Diagnosis present

## 2012-11-05 DIAGNOSIS — I272 Pulmonary hypertension, unspecified: Secondary | ICD-10-CM | POA: Diagnosis present

## 2012-11-05 DIAGNOSIS — Z9861 Coronary angioplasty status: Secondary | ICD-10-CM

## 2012-11-05 DIAGNOSIS — I2510 Atherosclerotic heart disease of native coronary artery without angina pectoris: Secondary | ICD-10-CM

## 2012-11-05 DIAGNOSIS — R7309 Other abnormal glucose: Secondary | ICD-10-CM | POA: Diagnosis present

## 2012-11-05 DIAGNOSIS — J438 Other emphysema: Secondary | ICD-10-CM | POA: Diagnosis present

## 2012-11-05 DIAGNOSIS — K219 Gastro-esophageal reflux disease without esophagitis: Secondary | ICD-10-CM | POA: Diagnosis present

## 2012-11-05 DIAGNOSIS — I509 Heart failure, unspecified: Secondary | ICD-10-CM | POA: Diagnosis present

## 2012-11-05 DIAGNOSIS — J9612 Chronic respiratory failure with hypercapnia: Secondary | ICD-10-CM

## 2012-11-05 DIAGNOSIS — R278 Other lack of coordination: Secondary | ICD-10-CM

## 2012-11-05 DIAGNOSIS — I5189 Other ill-defined heart diseases: Secondary | ICD-10-CM | POA: Diagnosis present

## 2012-11-05 DIAGNOSIS — E874 Mixed disorder of acid-base balance: Secondary | ICD-10-CM | POA: Diagnosis present

## 2012-11-05 DIAGNOSIS — I5021 Acute systolic (congestive) heart failure: Secondary | ICD-10-CM

## 2012-11-05 DIAGNOSIS — G4733 Obstructive sleep apnea (adult) (pediatric): Secondary | ICD-10-CM | POA: Diagnosis present

## 2012-11-05 DIAGNOSIS — I2789 Other specified pulmonary heart diseases: Secondary | ICD-10-CM | POA: Diagnosis present

## 2012-11-05 DIAGNOSIS — J9622 Acute and chronic respiratory failure with hypercapnia: Secondary | ICD-10-CM

## 2012-11-05 DIAGNOSIS — T380X5A Adverse effect of glucocorticoids and synthetic analogues, initial encounter: Secondary | ICD-10-CM | POA: Diagnosis not present

## 2012-11-05 DIAGNOSIS — R7303 Prediabetes: Secondary | ICD-10-CM | POA: Diagnosis present

## 2012-11-05 DIAGNOSIS — R911 Solitary pulmonary nodule: Secondary | ICD-10-CM | POA: Diagnosis present

## 2012-11-05 DIAGNOSIS — J9601 Acute respiratory failure with hypoxia: Secondary | ICD-10-CM

## 2012-11-05 DIAGNOSIS — E873 Alkalosis: Secondary | ICD-10-CM

## 2012-11-05 DIAGNOSIS — Z6841 Body Mass Index (BMI) 40.0 and over, adult: Secondary | ICD-10-CM

## 2012-11-05 DIAGNOSIS — J441 Chronic obstructive pulmonary disease with (acute) exacerbation: Secondary | ICD-10-CM

## 2012-11-05 HISTORY — DX: Heart failure, unspecified: I50.9

## 2012-11-05 LAB — URINALYSIS, ROUTINE W REFLEX MICROSCOPIC
Hgb urine dipstick: NEGATIVE
Nitrite: NEGATIVE
Specific Gravity, Urine: 1.007 (ref 1.005–1.030)
Urobilinogen, UA: 0.2 mg/dL (ref 0.0–1.0)
pH: 5 (ref 5.0–8.0)

## 2012-11-05 LAB — MRSA PCR SCREENING: MRSA by PCR: NEGATIVE

## 2012-11-05 LAB — CBC WITH DIFFERENTIAL/PLATELET
Basophils Absolute: 0 10*3/uL (ref 0.0–0.1)
Eosinophils Relative: 1 % (ref 0–5)
HCT: 43.6 % (ref 36.0–46.0)
Lymphocytes Relative: 12 % (ref 12–46)
MCHC: 31.4 g/dL (ref 30.0–36.0)
MCV: 97.8 fL (ref 78.0–100.0)
Monocytes Absolute: 1.4 10*3/uL — ABNORMAL HIGH (ref 0.1–1.0)
Monocytes Relative: 12 % (ref 3–12)
RDW: 13.3 % (ref 11.5–15.5)
WBC: 11.3 10*3/uL — ABNORMAL HIGH (ref 4.0–10.5)

## 2012-11-05 LAB — POCT I-STAT 3, ART BLOOD GAS (G3+)
Acid-Base Excess: 14 mmol/L — ABNORMAL HIGH (ref 0.0–2.0)
Bicarbonate: 43.5 mEq/L — ABNORMAL HIGH (ref 20.0–24.0)
O2 Saturation: 86 %
TCO2: 46 mmol/L (ref 0–100)
pO2, Arterial: 58 mmHg — ABNORMAL LOW (ref 80.0–100.0)

## 2012-11-05 LAB — BASIC METABOLIC PANEL
BUN: 10 mg/dL (ref 6–23)
CO2: 43 mEq/L (ref 19–32)
Calcium: 9.1 mg/dL (ref 8.4–10.5)
Creatinine, Ser: 0.73 mg/dL (ref 0.50–1.10)
Glucose, Bld: 122 mg/dL — ABNORMAL HIGH (ref 70–99)

## 2012-11-05 LAB — TROPONIN I: Troponin I: <0.3 ng/mL (ref <0.30)

## 2012-11-05 LAB — PHOSPHORUS: Phosphorus: 2.5 mg/dL (ref 2.3–4.6)

## 2012-11-05 MED ORDER — HEPARIN SODIUM (PORCINE) 5000 UNIT/ML IJ SOLN
5000.0000 [IU] | Freq: Three times a day (TID) | INTRAMUSCULAR | Status: DC
  Administered 2012-11-05 – 2012-11-08 (×10): 5000 [IU] via SUBCUTANEOUS
  Filled 2012-11-05 (×14): qty 1

## 2012-11-05 MED ORDER — METHYLPREDNISOLONE SODIUM SUCC 125 MG IJ SOLR
125.0000 mg | Freq: Once | INTRAMUSCULAR | Status: AC
  Administered 2012-11-05: 125 mg via INTRAVENOUS
  Filled 2012-11-05: qty 2

## 2012-11-05 MED ORDER — IPRATROPIUM BROMIDE 0.02 % IN SOLN
0.5000 mg | Freq: Once | RESPIRATORY_TRACT | Status: AC
  Administered 2012-11-05: 0.5 mg via RESPIRATORY_TRACT
  Filled 2012-11-05: qty 2.5

## 2012-11-05 MED ORDER — ASPIRIN 81 MG PO CHEW
324.0000 mg | CHEWABLE_TABLET | Freq: Once | ORAL | Status: AC
  Administered 2012-11-05: 324 mg via ORAL
  Filled 2012-11-05: qty 4

## 2012-11-05 MED ORDER — ALBUTEROL SULFATE (5 MG/ML) 0.5% IN NEBU
5.0000 mg | INHALATION_SOLUTION | Freq: Once | RESPIRATORY_TRACT | Status: AC
  Administered 2012-11-05: 5 mg via RESPIRATORY_TRACT
  Filled 2012-11-05: qty 1

## 2012-11-05 MED ORDER — SODIUM CHLORIDE 0.9 % IJ SOLN
3.0000 mL | Freq: Two times a day (BID) | INTRAMUSCULAR | Status: DC
  Administered 2012-11-06 – 2012-11-09 (×6): 3 mL via INTRAVENOUS

## 2012-11-05 MED ORDER — DEXTROSE 5 % IV SOLN
500.0000 mg | Freq: Once | INTRAVENOUS | Status: AC
  Administered 2012-11-05: 500 mg via INTRAVENOUS
  Filled 2012-11-05: qty 500

## 2012-11-05 MED ORDER — ALBUTEROL SULFATE (5 MG/ML) 0.5% IN NEBU
5.0000 mg | INHALATION_SOLUTION | RESPIRATORY_TRACT | Status: DC
  Administered 2012-11-05 – 2012-11-06 (×5): 5 mg via RESPIRATORY_TRACT
  Filled 2012-11-05 (×5): qty 1

## 2012-11-05 MED ORDER — FUROSEMIDE 10 MG/ML IJ SOLN
60.0000 mg | Freq: Once | INTRAMUSCULAR | Status: AC
  Administered 2012-11-05: 60 mg via INTRAVENOUS
  Filled 2012-11-05: qty 6

## 2012-11-05 MED ORDER — DEXTROSE 5 % IV SOLN
1.0000 g | Freq: Once | INTRAVENOUS | Status: AC
  Administered 2012-11-05: 1 g via INTRAVENOUS
  Filled 2012-11-05: qty 10

## 2012-11-05 MED ORDER — DEXTROSE 5 % IV SOLN
1.0000 g | INTRAVENOUS | Status: DC
  Administered 2012-11-06: 1 g via INTRAVENOUS
  Filled 2012-11-05 (×2): qty 10

## 2012-11-05 MED ORDER — FUROSEMIDE 10 MG/ML IJ SOLN
60.0000 mg | Freq: Four times a day (QID) | INTRAMUSCULAR | Status: DC
  Administered 2012-11-05 – 2012-11-06 (×2): 60 mg via INTRAVENOUS
  Filled 2012-11-05 (×4): qty 6

## 2012-11-05 MED ORDER — METHYLPREDNISOLONE SODIUM SUCC 125 MG IJ SOLR
125.0000 mg | Freq: Four times a day (QID) | INTRAMUSCULAR | Status: DC
  Administered 2012-11-05 – 2012-11-06 (×2): 125 mg via INTRAVENOUS
  Filled 2012-11-05 (×6): qty 2

## 2012-11-05 MED ORDER — SODIUM CHLORIDE 0.9 % IJ SOLN: 3.0000 mL | INTRAMUSCULAR | Status: DC | PRN

## 2012-11-05 MED ORDER — IPRATROPIUM BROMIDE 0.02 % IN SOLN
0.5000 mg | RESPIRATORY_TRACT | Status: DC
  Administered 2012-11-05 – 2012-11-06 (×5): 0.5 mg via RESPIRATORY_TRACT
  Filled 2012-11-05 (×5): qty 2.5

## 2012-11-05 MED ORDER — DEXTROSE 5 % IV SOLN
500.0000 mg | INTRAVENOUS | Status: DC
  Administered 2012-11-06: 500 mg via INTRAVENOUS
  Filled 2012-11-05 (×3): qty 500

## 2012-11-05 MED ORDER — SODIUM CHLORIDE 0.9 % IV SOLN: 250.0000 mL | INTRAVENOUS | Status: DC | PRN

## 2012-11-05 MED ORDER — NICOTINE 14 MG/24HR TD PT24
14.0000 mg | MEDICATED_PATCH | Freq: Every day | TRANSDERMAL | Status: DC
  Administered 2012-11-07 – 2012-11-09 (×3): 14 mg via TRANSDERMAL
  Filled 2012-11-05 (×5): qty 1

## 2012-11-05 NOTE — Consult Note (Signed)
PHARMACY CONSULT NOTE - INITIAL  Pharmacy Consult for : Azithromyciin, Ceftriaxone Indication: Empiric antibiotic coverage for presumed CAP  Allergies Fluconazole  CrCl: Estimated Creatinine Clearance: 95.6 ml/min (by C-G formula based on Cr of 0.73). CBC: WBC/Hgb/Hct/Plts:  11.3/13.7/43.6/198 (08/04 1553)   Current Medication:   albuterol 5 mg Nebulization Q4H  azithromycin 500 mg Intravenous Q24H  cefTRIAXone (ROCEPHIN)  IV 1 g Intravenous Q24H  [START ON 11/06/2012] furosemide 60 mg Intravenous Q6H  heparin 5,000 Units Subcutaneous Q8H  ipratropium 0.5 mg Nebulization Q4H  [START ON 11/06/2012] methylPREDNISolone (SOLU-MEDROL) injection 125 mg Intravenous Q6H  sodium chloride 3 mL Intravenous Q12H   Assessment:  57 y/o female with history of COPD to begin empiric antibiotic therapy for coverage of CAP vs COPD exacerbation vs new exacerbation of congestive heart failure.  CrCl 96 ml/min, WBC 11.3, 99.5 F (37.5 C) (Oral).   Goal of Therapy:   Renal Adjustment of Antibiotics  Plan:   Continue Azithromycin and Ceftriaxone as previously ordered.  No dose adjustments required.  Shanele Nissan, Craig Guess,  Pharm.D.  11/05/2012 8:24 PM

## 2012-11-05 NOTE — ED Provider Notes (Signed)
CSN: [id number]     Arrival date & time 11/05/12  1525 History     First MD Initiated Contact with Patient 11/05/12 1527     Chief Complaint  Patient presents with  . Shortness of Breath    HPI Patient reports worsening shortness of breath with associated cough over the past several days.  She has a history of COPD.  She wears 4 L of oxygen at home.  She continues to smoke cigarettes.  She has a history of coronary artery disease, pulmonary hypertension, diastolic dysfunction.  She states intermittent compliance with her medications.  She tried a breathing treatment today without improvement in her symptoms and now she presents emergency para per evaluation.  O2 sats on 4 L or 85%.  She denies fevers or chills.  She has no chest pain.  She does however have all mild discomfort in her chest only when she coughs.  She denies new bilateral leg swelling.   Past Medical History  Diagnosis Date  . Coronary artery disease     S/P PCI of LAD with DES (12/2008). Total occlusion of RCA noted at that time., medically managed. ACS ruled out 03/2009 with Lexiscan myoview . Followed by [facility].  . Pulmonary hypertension     2-D Echo (10/1217) - Systolic pressure was moderately increased. PA peak pressure  84mHg. 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 Followed by Dr. WJoya Gaskins([facility] Heckler  . Diastolic dysfunction     2-D Echo (12/2008) - Normal LV Systolic funciton with EF 60-65%. Grade 1 diastolid dysfunction. No regional wall motion abnormalities. Moderate pulmonary HTN with PA peak pressure 563mg.  . Marland KitchenOPD (chronic obstructive pulmonary disease)     Severe. Gold Stage IV.  PFTs (12/2008) - severe obstructive airway disease. Active tobacco use. Requires 4L O2 at home.  . Pulmonary nodule, right     Small right middle lobe nodule. Stable as of 12/2008.  . Marland Kitchenleep apnea     Presumptive. No documented  sleep studies.   . Prediabetes 12/2008    HgbA1c 6.4 (12/2008)  . Hx MRSA infection     Recurrent MRSA thigh abscesses.  . Tobacco abuse     Ongoing.  . Obesity   . Hyperlipidemia   . GERD (gastroesophageal reflux disease)     S/P Nissen fundoplication.  . Shortness of breath    Past Surgical History  Procedure Laterality Date  . Total abdominal hysterectomy w/ bilateral salpingoophorectomy    . Nissen fundoplication     Family History  Problem Relation Age of Onset  . Heart disease Mother 4733  Deceased from MI at 58yo. Hypertension Mother   . Heart disease Father 542  Deceased of MI age 58yo. Hypertension Father   . Hypertension Brother   . Lung cancer      Grandmother   History  Substance Use Topics  . Smoking status: Current Every Day Smoker -- 1.00 packs/day    Types: Cigarettes  . Smokeless tobacco: Never Used  . Alcohol Use: No   OB History   Grav Para Term Preterm Abortions TAB SAB Ect Mult Living                 Review of Systems  All other systems reviewed and are negative.    Allergies  Fluconazole  Home Medications   Current Outpatient Rx  Name  Route  Sig  Dispense  Refill  . albuterol (PROVENTIL HFA;VENTOLIN HFA) 108 (90 BASE) MCG/ACT inhaler   Inhalation   Inhale 2 puffs into the lungs every 2 (two) hours as needed for wheezing or shortness of breath (cough).   1 Inhaler   0   . Aspirin-Salicylamide-Caffeine (BC HEADACHE POWDER PO)   Oral   Take 1 packet by mouth daily as needed. For pain         . doxycycline (VIBRAMYCIN) 100 MG capsule   Oral   Take 1 capsule (100 mg total) by mouth 2 (two) times daily. One po bid x 7 days   14 capsule   0   . predniSONE (DELTASONE) 20 MG tablet      3 tabs po day one, then 2 po daily x 4 days   11 tablet   0    BP 124/67  Pulse 98  Temp(Src) 98.7 F (37.1 C) (Oral)  Resp 34  SpO2 89% Physical Exam  Nursing note and vitals reviewed. Constitutional: She is oriented to person,  place, and time. She appears well-developed and well-nourished. No distress.  HENT:  Head: Normocephalic and atraumatic.  Eyes: EOM are normal.  Neck: Normal range of motion.  Cardiovascular: Normal rate, regular rhythm and normal heart sounds.   Pulmonary/Chest: No respiratory distress. She has wheezes. She has rales.  Abdominal: Soft. She exhibits no distension. There is no tenderness.  Musculoskeletal: Normal range of motion. She exhibits no edema.  Neurological: She is alert and oriented to person, place, and time.  Skin: Skin is warm and dry.  Psychiatric: She has a normal mood and affect. Judgment normal.    ED Course   Procedures    Date: 11/05/2012  Rate: 98  Rhythm: normal sinus rhythm  QRS Axis: normal  Intervals: normal  ST/T Wave abnormalities: Inferior lateral T wave inversions  Conduction Disutrbances: none  Narrative Interpretation:   Old EKG Reviewed: Changed from prior EKG     Labs Reviewed  CBC WITH DIFFERENTIAL - Abnormal; Notable for the following:    WBC 11.3 (*)    Neutro Abs 8.5 (*)    Monocytes Absolute 1.4 (*)    All other components within normal limits  BASIC METABOLIC PANEL - Abnormal; Notable for the following:    Chloride 95 (*)    CO2 43 (*)    Glucose, Bld 122 (*)    All other components within normal limits  PRO B NATRIURETIC PEPTIDE - Abnormal; Notable for the following:    Pro B Natriuretic peptide (BNP) 4859.0 (*)    All other components within normal limits  TROPONIN I   Dg Chest Portable 1 View  11/05/2012   *RADIOLOGY REPORT*  Clinical Data: Respiratory distress, shortness of breath.  PORTABLE CHEST - 1 VIEW  Comparison: 08/11/2012  Findings: Cardiomegaly.  Vascular congestion.  Bilateral lower lobe atelectasis or infiltrates.  Airspace opacity in the right upper lobe peripherally.  Small bilateral pleural effusions.  Diffuse interstitial prominence could reflect interstitial edema.  IMPRESSION: Cardiomegaly with vascular congestion  and possible interstitial edema.  More focal opacities in the lung bases and inferior right upper lobe.  This could represent atelectasis or infiltrates.  Small effusions.   Original Report Authenticated By: Rolm Baptise, M.D.   I personally reviewed the imaging tests through PACS system I reviewed available ER/hospitalization records through the EMR   1. CHF (congestive heart failure), acute, systolic   2. COPD (chronic obstructive pulmonary disease)  MDM  Patient symptoms appear to possibly represent new onset congestive heart failure exacerbation.  IV Lasix will be given.  It's unclear as to whether or not some of this could represent pneumonia.  She will be given antibiotics to cover for community acquired pneumonia.  There is new inferior lateral T wave inversions as compared to prior EKG.  Aspirin given.  The patient has no active chest pain or other anginal symptoms at this time.  Hoy Morn, MD 11/05/12 [phone]

## 2012-11-05 NOTE — ED Notes (Signed)
PT reports a cough for 4 days. Pt reports  a Hx of COPD with a baseline O2 sats of 85 % on 4 liters nasal . Pt is also reporting to be an active smoker.

## 2012-11-05 NOTE — Progress Notes (Signed)
Arrived in pt room to place bipap at this time. Pt refused stating that she wanted to eat her dinner first, that she might give it a try later. Also pt stated that she was feeling a bit better. Pt also stated that she is very claustrophobic and she might not wear the mask. RT left bipap in room, Pt on 6L [place]. sats 87-89%. Pt has a compensated resp acidosis:  7.34/79/58/43. Will attempt to place bipap later on tonight per MD.

## 2012-11-05 NOTE — H&P (Signed)
Date: 11/06/2012               Patient Name:  [name] MRN: [id number]  DOB: [birth year] Age / Sex: 57 y.o., female   PCP: Provider Default, MD         Medical Service: Internal Medicine Teaching Service         Attending Physician: Dr. Dominic Pea, DO    First Contact: Dr. Juluis Mire Pager: [phone]  Second Contact: Dr. 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 Pt reports she was in her normal state of health until two days ago when she became short of breath with exertion, while at rest, and when sitting upright. She also reports having a productive cough at baseline but for the past two days it has been worse but dry in nature. She has also had subjective fever with chills. She also has had bilateral mild discomfort in her upper chest that is worse with cough. No nausea, vomiting, diarrhea.  She has history of CHF, but has been noncompliant with her home Lasix and has not taken it since last admission. States her leg swelling is at baseline. Denies orthopnea, PND. No recent changes in her diet or changes in weight. She reports fatigue for the past couple of months.  She reports being diagnosed with COPD 3 years ago with exacerbations every 6 months requiring hospitalization with no reported intubation. She is on 4L of home oxygen. She was using spirivia at home but ran out 2-3 months ago. Also has not been using rescue Albuterol inhaler. She continues to smoke 0.5 pack daily.    No recent sick  contacts or travel.      In the ER, her O2 sats on 4L were 85%.  Meds: Current Facility-Administered Medications  Medication Dose Route Frequency Provider Last Rate Last Dose  . 0.9 %  sodium chloride infusion  250 mL Intravenous PRN Na Li, MD 10 mL/hr at 11/05/12 2000 250 mL at 11/05/12 2000  . albuterol (PROVENTIL) (5 MG/ML) 0.5% nebulizer solution 5 mg  5 mg Nebulization Q4H Na Li, MD   5 mg at 11/06/12 0601  . azithromycin (ZITHROMAX) 500 mg in dextrose 5 % 250 mL IVPB  500 mg Intravenous Q24H Na Li, MD      . cefTRIAXone (ROCEPHIN) 1 g in dextrose 5 % 50 mL IVPB  1 g Intravenous Q24H Na Li, MD      . furosemide (LASIX) injection 60 mg  60 mg Intravenous Q6H Na Li, MD   60 mg at 11/06/12 0537  . heparin injection 5,000 Units  5,000 Units Subcutaneous Q8H Charlann Lange, MD   5,000 Units at 11/06/12 0537  . ipratropium (ATROVENT) nebulizer solution 0.5 mg  0.5 mg Nebulization Q4H Na Li, MD   0.5 mg at 11/06/12 0601  . methylPREDNISolone sodium succinate (SOLU-MEDROL) 125 mg/2 mL injection 125 mg  125 mg Intravenous Q6H Na Li, MD   125 mg at  11/06/12 0538  . nicotine (NICODERM CQ - dosed in mg/24 hours) patch 14 mg  14 mg Transdermal Daily Na Li, MD      . sodium chloride 0.9 % injection 3 mL  3 mL Intravenous Q12H Na Li, MD      . sodium chloride 0.9 % injection 3 mL  3 mL Intravenous PRN Charlann Lange, MD        Allergies: Allergies as of 11/05/2012 - Review Complete 11/05/2012  Allergen Reaction Noted  . Fluconazole Anaphylaxis    Past Medical History  Diagnosis Date  . Coronary artery disease     S/P PCI of LAD with DES (12/2008). Total occlusion of RCA noted at that time., medically managed. ACS ruled out 03/2009 with Lexiscan myoview . Followed by [facility].  . Pulmonary hypertension     2-D Echo (16/1096) - Systolic pressure was moderately increased. PA peak pressure  60mHg. secondary pulm htn likely on basis of comb of interstital lung disease, severe copd, small airways disease, severe sleep  apnea and cor pulmonale,. Followed by Dr. WJoya Gaskins(Vel[facility] Heckler  . Diastolic dysfunction     2-D Echo (12/2008) - Normal LV Systolic funciton with EF 60-65%. Grade 1 diastolid dysfunction. No regional wall motion abnormalities. Moderate pulmonary HTN with PA peak pressure 567mg.  . Marland KitchenOPD (chronic obstructive pulmonary disease)     Severe. Gold Stage IV.  PFTs (12/2008) - severe obstructive airway disease. Active tobacco use. Requires 4L O2 at home.  . Pulmonary nodule, right     Small right middle lobe nodule. Stable as of 12/2008.  . Marland Kitchenleep apnea     Presumptive. No documented sleep studies.   . Prediabetes 12/2008    HgbA1c 6.4 (12/2008)  . Hx MRSA infection     Recurrent MRSA thigh abscesses.  . Tobacco abuse     Ongoing.  . Obesity   . Hyperlipidemia   . GERD (gastroesophageal reflux disease)     S/P Nissen fundoplication.  . Shortness of breath    Past Surgical History  Procedure Laterality Date  . Total abdominal hysterectomy w/ bilateral salpingoophorectomy    . Nissen fundoplication     Family History  Problem Relation Age of Onset  . Heart disease Mother [id number]  Deceased from MI at 58yo. Hypertension Mother   . Heart disease Father [id number]  Deceased of MI age 58yo. Hypertension Father   . Hypertension Brother   . Lung cancer      Grandmother   History   Social History  . Marital Status: Single    Spouse Name: N/A    Number of Children: N/A  . Years of Education: N/A   Occupational History  . Not on file.   Social History Main Topics  . Smoking status: Current Every Day Smoker -- 1.00 packs/day    Types: Cigarettes  . Smokeless tobacco: Never Used  . Alcohol Use: No  . Drug Use: No  . Sexually Active: No   Other Topics Concern  . Not on file   Social History Narrative   Formerly worked as a caScientist, water qualitynow disabled.   Divorced.   2 grown children.   Lives with her grandson.    Review of Systems: Pertinent items are noted in HPI. 10 point ROS  performed.  Physical Exam: Blood pressure 121/87, pulse 77, temperature 97.7 F (36.5 C), temperature source Oral, resp. rate 15, height _0  (1.651 m), weight 111.9 kg (246 lb 11.1  oz), SpO2 78.00%.  Physical Exam  Constitutional: She is oriented to person, place, and time and well-developed, well-nourished, and in no distress. No distress.  HENT:  Head: Normocephalic and atraumatic.  Mouth/Throat: Oropharynx is clear and moist.  Eyes: Conjunctivae and EOM are normal. Pupils are equal, round, and reactive to light.  Neck: Normal range of motion. Neck supple.  Cardiovascular: Normal rate, regular rhythm, normal heart sounds and intact distal pulses.  Exam reveals no gallop and no friction rub.   No murmur heard. 1+ pitting edema to shins bilaterally  Pulmonary/Chest: No accessory muscle usage. Tachypnea ([place], RR=24) noted. No respiratory distress. She has rales in the right upper field, the right lower field and the left lower field.  On 6L oxygen via nasal cannula  Abdominal: Soft. Bowel sounds are normal.  Musculoskeletal: Normal range of motion.  Neurological: She is alert and oriented to person, place, and time. No cranial nerve deficit. GCS score is 15.  Skin: Skin is warm and dry. She is not diaphoretic.    Lab results:  Basic Metabolic Panel:  Recent Labs  11/05/12 1553 11/05/12 2045 11/06/12 0218  NA 142  --  140  K 3.6  --  3.3*  CL 95*  --  89*  CO2 43*  --  >45*  GLUCOSE 122*  --  240*  BUN 10  --  13  CREATININE 0.73  --  0.78  CALCIUM 9.1  --  8.9  MG  --  1.8  --   PHOS  --  2.5  --    CBC:  Recent Labs  11/05/12 1553 11/06/12 0218  WBC 11.3* 8.7  NEUTROABS 8.5*  --   HGB 13.7 14.1  HCT 43.6 43.5  MCV 97.8 96.9  PLT 198 211   Cardiac Enzymes:  Recent Labs  11/05/12 1553 11/05/12 2045 11/06/12 0218  TROPONINI <0.30 <0.30 <0.30   BNP:  Recent Labs  11/05/12 1553  PROBNP 4859.0*   Thyroid Function Tests:  Recent Labs   11/05/12 2045  TSH 0.708    Urinalysis:  Recent Labs  11/05/12 2115  COLORURINE YELLOW  LABSPEC 1.007  PHURINE 5.0  GLUCOSEU NEGATIVE  HGBUR NEGATIVE  BILIRUBINUR NEGATIVE  KETONESUR NEGATIVE  PROTEINUR NEGATIVE  UROBILINOGEN 0.2  NITRITE NEGATIVE  LEUKOCYTESUR NEGATIVE    Misc. Labs:  ABG    Component Value Date/Time   PHART 7.349* 11/05/2012 1738   PCO2ART 79.0* 11/05/2012 1738   PO2ART 58.0* 11/05/2012 1738   HCO3 43.5* 11/05/2012 1738   TCO2 46 11/05/2012 1738   O2SAT 86.0 11/05/2012 1738    Imaging results:  Dg Chest Portable 1 View  11/05/2012   *RADIOLOGY REPORT*  Clinical Data: Respiratory distress, shortness of breath.  PORTABLE CHEST - 1 VIEW  Comparison: 08/11/2012  Findings: Cardiomegaly.  Vascular congestion.  Bilateral lower lobe atelectasis or infiltrates.  Airspace opacity in the right upper lobe peripherally.  Small bilateral pleural effusions.  Diffuse interstitial prominence could reflect interstitial edema.  IMPRESSION: Cardiomegaly with vascular congestion and possible interstitial edema.  More focal opacities in the lung bases and inferior right upper lobe.  This could represent atelectasis or infiltrates.  Small effusions.   Original Report Authenticated By: Rolm Baptise, M.D.    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  # COPD exacerbation with chronic hypercapnia - O2 saturation 85 on 4LNC at presentation. Increase in SOB and cough x2 days. ABG shows a chronic hypercapnia with superimposed metabolic alkylosis. - No BiPap given chronicity of her hypercapnia - 6L O2 via [place] with continuous pulse ox, goal O2 sat 88-92% - Solu-Medrol 125 mg every 6 hours  - Duonebs q4h and PRN - Checking HIV, TSH - Plan discussed with Dr. Titus Mould by Dr. Nicoletta Dress  # community acquired pneumonia - WBC 11.3, multiple focal opacities on CXR, rales on physical exam. - IV Ceftriaxone and Azithromycin  - Checking blood cultures - Checking sputum culture, gram stain - Checking legionella antigen, strep pneumo urinary antigen, flu antigen - Incentive spirometry every 2 hours while awake  # possible acute on chronic CHF - BMP 4800. Lower extremity edema on exam. Denies orthopnea, PND. - Lasix 41m Q6H. Foley placed. - repeat 2D echo - Consult cards tomorrow am  # Hx of CAD - New inferior lateral T wave inversions as compared to prior EKG. No active chest pain or anginal symptoms. - ASA 3230mgiven in ED - Cycling troponins (neg x2)  # tobacco dependence  - nicotine patch 1411m#DVT PPX - Heparin subq  Dispo: Disposition is deferred at this time, awaiting improvement of current medical problems. Anticipated discharge in approximately 1-3 day(s).   The patient does have a current PCP (Provider Default, MD) and [facility].  The patient does not have transportation limitations that hinder transportation to clinic appointments.  Signed: MarJuluis MireD 11/06/2012, 6:48 AM

## 2012-11-06 ENCOUNTER — Encounter (HOSPITAL_COMMUNITY): Payer: Self-pay | Admitting: General Practice

## 2012-11-06 ENCOUNTER — Inpatient Hospital Stay (HOSPITAL_COMMUNITY): Payer: Medicare Other

## 2012-11-06 DIAGNOSIS — J441 Chronic obstructive pulmonary disease with (acute) exacerbation: Secondary | ICD-10-CM

## 2012-11-06 DIAGNOSIS — F17200 Nicotine dependence, unspecified, uncomplicated: Secondary | ICD-10-CM

## 2012-11-06 DIAGNOSIS — J9600 Acute respiratory failure, unspecified whether with hypoxia or hypercapnia: Secondary | ICD-10-CM

## 2012-11-06 DIAGNOSIS — J9610 Chronic respiratory failure, unspecified whether with hypoxia or hypercapnia: Secondary | ICD-10-CM

## 2012-11-06 DIAGNOSIS — I5033 Acute on chronic diastolic (congestive) heart failure: Principal | ICD-10-CM

## 2012-11-06 LAB — LEGIONELLA ANTIGEN, URINE: Legionella Antigen, Urine: NEGATIVE

## 2012-11-06 LAB — GLUCOSE, CAPILLARY
Glucose-Capillary: 348 mg/dL — ABNORMAL HIGH (ref 70–99)
Glucose-Capillary: 176 mg/dL — ABNORMAL HIGH (ref 70–99)

## 2012-11-06 LAB — BASIC METABOLIC PANEL
BUN: 13 mg/dL (ref 6–23)
CO2: >45 mEq/L (ref 19–32)
Chloride: 89 mEq/L — ABNORMAL LOW (ref 96–112)
GFR calc Af Amer: >90 mL/min (ref >90)
Glucose, Bld: 240 mg/dL — ABNORMAL HIGH (ref 70–99)
Potassium: 3.3 mEq/L — ABNORMAL LOW (ref 3.5–5.1)

## 2012-11-06 LAB — CBC
HCT: 43.5 % (ref 36.0–46.0)
Hemoglobin: 14.1 g/dL (ref 12.0–15.0)
MCHC: 32.4 g/dL (ref 30.0–36.0)
MCV: 96.9 fL (ref 78.0–100.0)

## 2012-11-06 LAB — HEMOGLOBIN A1C: Hgb A1c MFr Bld: 5.8 % — ABNORMAL HIGH (ref <5.7)

## 2012-11-06 LAB — TROPONIN I: Troponin I: <0.3 ng/mL (ref <0.30)

## 2012-11-06 MED ORDER — ACETAMINOPHEN 325 MG PO TABS
650.0000 mg | ORAL_TABLET | Freq: Once | ORAL | Status: AC
  Administered 2012-11-06: 650 mg via ORAL
  Filled 2012-11-06: qty 2

## 2012-11-06 MED ORDER — INSULIN ASPART 100 UNIT/ML ~~LOC~~ SOLN
0.0000 [IU] | SUBCUTANEOUS | Status: DC
  Administered 2012-11-06: 16 [IU] via SUBCUTANEOUS

## 2012-11-06 MED ORDER — IPRATROPIUM BROMIDE 0.02 % IN SOLN
0.5000 mg | Freq: Four times a day (QID) | RESPIRATORY_TRACT | Status: DC
  Administered 2012-11-06 – 2012-11-09 (×10): 0.5 mg via RESPIRATORY_TRACT
  Filled 2012-11-06 (×11): qty 2.5

## 2012-11-06 MED ORDER — INSULIN GLARGINE 100 UNIT/ML ~~LOC~~ SOLN
5.0000 [IU] | Freq: Every day | SUBCUTANEOUS | Status: DC
  Administered 2012-11-06 – 2012-11-08 (×3): 5 [IU] via SUBCUTANEOUS
  Filled 2012-11-06 (×5): qty 0.05

## 2012-11-06 MED ORDER — INSULIN ASPART 100 UNIT/ML ~~LOC~~ SOLN
0.0000 [IU] | Freq: Three times a day (TID) | SUBCUTANEOUS | Status: DC
  Administered 2012-11-06: 4 [IU] via SUBCUTANEOUS
  Administered 2012-11-07: 2 [IU] via SUBCUTANEOUS
  Administered 2012-11-07 (×2): 4 [IU] via SUBCUTANEOUS
  Administered 2012-11-08: 8 [IU] via SUBCUTANEOUS
  Administered 2012-11-08 (×2): 4 [IU] via SUBCUTANEOUS
  Administered 2012-11-09: 2 [IU] via SUBCUTANEOUS
  Administered 2012-11-09: 4 [IU] via SUBCUTANEOUS

## 2012-11-06 MED ORDER — POTASSIUM CHLORIDE CRYS ER 20 MEQ PO TBCR
40.0000 meq | EXTENDED_RELEASE_TABLET | Freq: Once | ORAL | Status: AC
  Administered 2012-11-06: 40 meq via ORAL
  Filled 2012-11-06: qty 2

## 2012-11-06 MED ORDER — ALBUTEROL SULFATE (5 MG/ML) 0.5% IN NEBU
5.0000 mg | INHALATION_SOLUTION | Freq: Four times a day (QID) | RESPIRATORY_TRACT | Status: DC
  Administered 2012-11-06 – 2012-11-09 (×11): 5 mg via RESPIRATORY_TRACT
  Filled 2012-11-06: qty 0.5
  Filled 2012-11-06 (×2): qty 1
  Filled 2012-11-06: qty 0.5
  Filled 2012-11-06: qty 1
  Filled 2012-11-06 (×4): qty 0.5
  Filled 2012-11-06: qty 1
  Filled 2012-11-06 (×4): qty 0.5
  Filled 2012-11-06 (×2): qty 1
  Filled 2012-11-06 (×2): qty 0.5

## 2012-11-06 MED ORDER — PREDNISONE 20 MG PO TABS
40.0000 mg | ORAL_TABLET | Freq: Every day | ORAL | Status: DC
  Administered 2012-11-07 – 2012-11-09 (×3): 40 mg via ORAL
  Filled 2012-11-06 (×4): qty 2

## 2012-11-06 MED ORDER — FUROSEMIDE 10 MG/ML IJ SOLN
60.0000 mg | Freq: Two times a day (BID) | INTRAMUSCULAR | Status: DC
  Administered 2012-11-06: 60 mg via INTRAVENOUS
  Filled 2012-11-06 (×2): qty 6

## 2012-11-06 NOTE — Progress Notes (Signed)
CSW order received for "tobacco abuse". This is not an appropriate CSW order.  Smoking cessation teaching/resources are provided by nursing services.  CSW will sign off.  Lorie Phenix. [place], [place]

## 2012-11-06 NOTE — Progress Notes (Signed)
Report called to [place], Therapist, sports.

## 2012-11-06 NOTE — Progress Notes (Signed)
Subjective:  Pt seen and examined. No acute events overnight. Pt reports feeling much better. Dyspnea , dry cough, and chest pain improved. No fevers, chills, palpitations, diaphoresis, or increased leg swelling.   Objective: Vital signs in last 24 hours: Filed Vitals:   11/06/12 0601 11/06/12 0724 11/06/12 0842 11/06/12 1140  BP:  118/62  122/65  Pulse: 77 92  63  Temp:  98.5 F (36.9 C)  97.7 F (36.5 C)  TempSrc:  Oral  Oral  Resp: _0 Height:      Weight:      SpO2: 78% 83% 89% 86%   Weight change:   Intake/Output Summary (Last 24 hours) at 11/06/12 1409 Last data filed at 11/06/12 1200  Gross per 24 hour  Intake    890 ml  Output   2620 ml  Net  -1730 ml   Constitutional: No distress.  HENT: Normocephalic    Neck: Normal range of motion. Neck supple.  Cardiovascular: Normal rate, regular rhythm, normal heart sounds and intact distal pulses.  1+ pitting edema to shins bilaterally  Pulmonary/Chest: No accessory muscle usage. No respiratory distress. She has rales in the right upper field, the right lower field and the left lower field.  Abdominal: Soft. Bowel sounds are normal.  Musculoskeletal: Normal range of motion.  Neurological: She is alert and oriented to person, place, and time. No cranial nerve deficit. GCS score is 15.  Skin: Skin is warm and dry. She is not diaphoretic.   Results: Basic Metabolic Panel:  Recent Labs Lab 11/05/12 1553 11/05/12 2045 11/06/12 0218  NA 142  --  140  K 3.6  --  3.3*  CL 95*  --  89*  CO2 43*  --  >45*  GLUCOSE 122*  --  240*  BUN 10  --  13  CREATININE 0.73  --  0.78  CALCIUM 9.1  --  8.9  MG  --  1.8  --   PHOS  --  2.5  --    Liver Function Tests: No results found for this basename: AST, ALT, ALKPHOS, BILITOT, PROT, ALBUMIN,  in the last 168 hours No results found for this basename: LIPASE, AMYLASE,  in the last 168 hours No results found for this basename: AMMONIA,  in the last 168  hours CBC:  Recent Labs Lab 11/05/12 1553 11/06/12 0218  WBC 11.3* 8.7  NEUTROABS 8.5*  --   HGB 13.7 14.1  HCT 43.6 43.5  MCV 97.8 96.9  PLT 198 211   Cardiac Enzymes:  Recent Labs Lab 11/05/12 2045 11/06/12 0218 11/06/12 1040  TROPONINI <0.30 <0.30 <0.30   BNP:  Recent Labs Lab 11/05/12 1553  PROBNP 4859.0*   D-Dimer: No results found for this basename: DDIMER,  in the last 168 hours CBG: No results found for this basename: GLUCAP,  in the last 168 hours Hemoglobin A1C: No results found for this basename: HGBA1C,  in the last 168 hours Fasting Lipid Panel: No results found for this basename: CHOL, HDL, LDLCALC, TRIG, CHOLHDL, LDLDIRECT,  in the last 168 hours Thyroid Function Tests:  Recent Labs Lab 11/05/12 2045  TSH 0.708   Coagulation: No results found for this basename: LABPROT, INR,  in the last 168 hours Anemia Panel: No results found for this basename: VITAMINB12, FOLATE, FERRITIN, TIBC, IRON, RETICCTPCT,  in the last 168 hours Urine Drug Screen: Drugs of Abuse     Component Value Date/Time   LABOPIA NEGATIVE 02/04/2009 2315  COCAINSCRNUR NEGATIVE 02/04/2009 2315   LABBENZ NEGATIVE 02/04/2009 2315   AMPHETMU NEGATIVE 02/04/2009 2315    Alcohol Level: No results found for this basename: ETH,  in the last 168 hours Urinalysis:  Recent Labs Lab 11/05/12 2115  [place] 1.007  PHURINE 5.0  [place]  PROTEINUR NEGATIVE  UROBILINOGEN 0.2  NITRITE NEGATIVE  [place]. Labs:   Micro Results: Recent Results (from the past 240 hour(s))  MRSA PCR SCREENING     Status: None   Collection Time    11/05/12  8:05 PM      Result Value Range Status   MRSA by PCR NEGATIVE  NEGATIVE Final   Comment:            The GeneXpert MRSA Assay (FDA     approved for NASAL specimens     only), is one component of a     comprehensive MRSA colonization      surveillance program. It is not     intended to diagnose MRSA     infection nor to guide or     monitor treatment for     MRSA infections.   Studies/Results: Dg Chest Port 1 View  11/06/2012   *RADIOLOGY REPORT*  Clinical Data: Pneumonia.  PORTABLE CHEST - 1 VIEW  Comparison: 11/05/2012  Findings: Cardiomegaly with vascular congestion.  Diffuse bilateral airspace opacities throughout the lungs.  This presumably represents edema.  No visible significant effusion.  No acute bony abnormality. Improvement in the focal opacity in the right upper lobe.  IMPRESSION: Cardiomegaly, vascular congestion and bilateral airspace opacities, most likely edema.   Original Report Authenticated By: Rolm Baptise, M.D.   Dg Chest Portable 1 View  11/05/2012   *RADIOLOGY REPORT*  Clinical Data: Respiratory distress, shortness of breath.  PORTABLE CHEST - 1 VIEW  Comparison: 08/11/2012  Findings: Cardiomegaly.  Vascular congestion.  Bilateral lower lobe atelectasis or infiltrates.  Airspace opacity in the right upper lobe peripherally.  Small bilateral pleural effusions.  Diffuse interstitial prominence could reflect interstitial edema.  IMPRESSION: Cardiomegaly with vascular congestion and possible interstitial edema.  More focal opacities in the lung bases and inferior right upper lobe.  This could represent atelectasis or infiltrates.  Small effusions.   Original Report Authenticated By: Rolm Baptise, M.D.   Medications: I have reviewed the patient's current medications. Scheduled Meds: . albuterol  5 mg Nebulization Q6H  . azithromycin  500 mg Intravenous Q24H  . cefTRIAXone (ROCEPHIN)  IV  1 g Intravenous Q24H  . [START ON 11/07/2012] furosemide  60 mg Intravenous Q12H  . heparin  5,000 Units Subcutaneous Q8H  . insulin aspart  0-24 Units Subcutaneous Q4H  . insulin glargine  5 Units Subcutaneous QHS  . ipratropium  0.5 mg Nebulization Q6H  . nicotine  14 mg Transdermal Daily  . potassium chloride  40 mEq Oral  Once  . [START ON 11/07/2012] predniSONE  40 mg Oral Q breakfast  . sodium chloride  3 mL Intravenous Q12H   Continuous Infusions:  PRN Meds:.sodium chloride, sodium chloride Assessment/Plan: Principal Problem:   Acute respiratory failure with hypoxia Active Problems:   OBESITY   TOBACCO ABUSE   CORONARY ARTERY DISEASE   PULMONARY HYPERTENSION, SECONDARY   CHRONIC OBSTRUCTIVE PULMONARY DISEASE   GASTROESOPHAGEAL REFLUX DISEASE   SLEEP APNEA   COPD (chronic obstructive pulmonary disease)   Hyperlipidemia   Coronary artery disease   Diastolic  dysfunction   CHF (congestive heart failure)   Hypercapnic respiratory failure, chronic   Metabolic alkalosis   CAP (community acquired pneumonia)   Acute on chronic diastolic heart failure   COPD exacerbation  Pt is a 57 yo Caucasian female with multiple pmh significant for obesity, HL, GERD s/p fundoplication, CAD s/p DES (2010), pulmonary HTN, 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   # COPD exacerbation with chronic hypercapnia - O2 saturation 85 on 4LNC at presentation. Increase in SOB and cough x2 days. ABG shows a chronic hypercapnia with superimposed metabolic alkylosis.  - No BiPap given chronicity of her hypercapnia  - 6L O2 via [place] with continuous pulse ox, goal O2 sat 88-92%  - Prednisone 40 mg daily  - Duonebs q6h and PRN  - HIV, TSH --> WNL - Ambulate order  # community acquired pneumonia - WBC 11.3, multiple focal opacities on CXR, rales on physical exam.  - IV Ceftriaxone and Azithromycin  - Awaiting blood cultures  - Awaiting legionella antigen, strep pneumo urinary antigen, flu antigen  - Incentive spirometry every 2 hours while awake  - Repeat posterior and lateral CXR - increasing b/l atelectasis and pulmonary  edema changes   # possible acute on chronic CHF - BMP 4800. Lower extremity edema on exam. Denies orthopnea, PND.  - Lasix 50m Q12H (foley placed)  - repeat EKG unchangedd    # Hx of CAD - New inferior lateral T wave inversions as compared to prior EKG. No active chest pain or anginal symptoms.  - ASA 3257mgiven in ED  - Cycling troponins (neg x3)   # tobacco dependence  - nicotine patch 145m#hyperglycemia - most likely due steroids -Start ISS -Lantus 5U before bed -Obtain HbA1c  #DVT PPX - Heparin subq   Dispo: Disposition is deferred at this time, awaiting improvement of current medical problems.  Anticipated discharge in approximately day(s).   The patient does have a current PCP (Provider Default, MD) and [facility].  The patient does have transportation limitations that hinder transportation to clinic appointments.  .Services Needed at time of discharge: Y = Yes, Blank = No PT:   OT:   RN:   Equipment:   Other:     LOS: 1 day   MarJuluis MireD 11/06/2012, 2:09 PM

## 2012-11-06 NOTE — Care Management Note (Addendum)
Page 1 of 1   11/09/2012     3:29:27 PM   CARE MANAGEMENT NOTE 11/09/2012  Patient:  [name], [name]   Account Number:  [id number]  Date Initiated:  11/06/2012  Documentation initiated by:  Elissa Hefty  Subjective/Objective Assessment:   adm w heart failure     Action/Plan:   lives alone   Anticipated DC Date:     Anticipated DC Plan:  [place]  CM consult      Choice offered to / List presented to:             [place].   Status of service:  In process, will continue to follow Medicare Important Message given?   (If response is "NO", the following Medicare IM given date fields will be blank) Date Medicare IM given:   Date Additional Medicare IM given:    Discharge Disposition:    Per UR Regulation:  Reviewed for med. necessity/level of care/duration of stay  If discussed at [place] of Stay Meetings, dates discussed:    Comments:  11/09/12 1150 In to speak with pt. about concerns of medications, and transportation.  Pt. states she does have prescription coverage with her insurance and can obtain her medications generally.  She states if she does not obtain her medications in the first of the month, then she does not have enough money to obtain them at the end of month. Pt. is a current smoker.  Pt. is not eligible for the [facility] program, since she has insurance, and her insurance has a prescription benefit.  This NCM offered [facility] as an option for her transportation difficulty, and she was interested. Will give pt. application for [facility]. Llana Aliment, RN, BSN NCM [phone]

## 2012-11-06 NOTE — H&P (Signed)
INTERNAL MEDICINE TEACHING SERVICE Attending Admission Note  Date: 11/06/2012  Patient name: [name]  Medical record number: [id number]  Date of birth: October 01, 1955    I have seen and evaluated Michelene Gardener and discussed their care with the Residency Team.  Acute on chronic hypoxic hypercarbic respiratory failure. She has O2 dependent GOLD stage 4 COPD. She continues to use tobacco, understands the risks.  She has not been taking her home lasix dose. She admits to eating an improper diet. She admits to skipping primary care appointments.  I explained medical consequences of her choices.   She is improved medically today. Decrease lasix to 60 mg IV q12hrs.  Continue IV Rocephin and azithro for now, but suspect this is more related to HFpEF. She very likely has Right sided failure due to pulmonary HTN causing left sided failure. Cont diuresis. Decrease steroids to prednisone 60 mg daily. PT consult.    Dominic Pea, DO 8/5/20141:31 PM

## 2012-11-07 DIAGNOSIS — I509 Heart failure, unspecified: Secondary | ICD-10-CM

## 2012-11-07 DIAGNOSIS — I5021 Acute systolic (congestive) heart failure: Secondary | ICD-10-CM

## 2012-11-07 DIAGNOSIS — R259 Unspecified abnormal involuntary movements: Secondary | ICD-10-CM

## 2012-11-07 DIAGNOSIS — R278 Other lack of coordination: Secondary | ICD-10-CM

## 2012-11-07 LAB — CBC
Hemoglobin: 13.6 g/dL (ref 12.0–15.0)
MCH: 29.6 pg (ref 26.0–34.0)
MCHC: 30.8 g/dL (ref 30.0–36.0)
Platelets: 248 10*3/uL (ref 150–400)
RDW: 13.2 % (ref 11.5–15.5)

## 2012-11-07 LAB — MAGNESIUM: Magnesium: 2.1 mg/dL (ref 1.5–2.5)

## 2012-11-07 LAB — HEPATIC FUNCTION PANEL
ALT: 14 U/L (ref 0–35)
AST: 12 U/L (ref 0–37)
Albumin: 3 g/dL — ABNORMAL LOW (ref 3.5–5.2)
Bilirubin, Direct: <0.1 mg/dL (ref 0.0–0.3)
Total Bilirubin: 0.2 mg/dL — ABNORMAL LOW (ref 0.3–1.2)

## 2012-11-07 LAB — BASIC METABOLIC PANEL
BUN: 26 mg/dL — ABNORMAL HIGH (ref 6–23)
BUN: 31 mg/dL — ABNORMAL HIGH (ref 6–23)
CO2: >45 mEq/L (ref 19–32)
Calcium: 9.3 mg/dL (ref 8.4–10.5)
Chloride: 84 mEq/L — ABNORMAL LOW (ref 96–112)
Creatinine, Ser: 0.8 mg/dL (ref 0.50–1.10)
Creatinine, Ser: 0.85 mg/dL (ref 0.50–1.10)
GFR calc Af Amer: >90 mL/min (ref >90)
GFR calc Af Amer: 86 mL/min — ABNORMAL LOW (ref >90)
GFR calc non Af Amer: 80 mL/min — ABNORMAL LOW (ref >90)
Potassium: 4.4 mEq/L (ref 3.5–5.1)
Potassium: 4.1 mEq/L (ref 3.5–5.1)

## 2012-11-07 LAB — GLUCOSE, CAPILLARY: Glucose-Capillary: 199 mg/dL — ABNORMAL HIGH (ref 70–99)

## 2012-11-07 MED ORDER — FUROSEMIDE 10 MG/ML IJ SOLN
60.0000 mg | Freq: Four times a day (QID) | INTRAMUSCULAR | Status: DC
  Administered 2012-11-07 – 2012-11-08 (×5): 60 mg via INTRAVENOUS
  Filled 2012-11-07 (×5): qty 6

## 2012-11-07 MED ORDER — AZITHROMYCIN 500 MG PO TABS
500.0000 mg | ORAL_TABLET | Freq: Every day | ORAL | Status: DC
  Administered 2012-11-07 – 2012-11-09 (×3): 500 mg via ORAL
  Filled 2012-11-07 (×4): qty 1

## 2012-11-07 MED ORDER — ACETAMINOPHEN 325 MG PO TABS
650.0000 mg | ORAL_TABLET | ORAL | Status: DC | PRN
  Administered 2012-11-07: 650 mg via ORAL
  Filled 2012-11-07: qty 2

## 2012-11-07 NOTE — Consult Note (Signed)
NEURO HOSPITALIST CONSULT NOTE    Reason for Consult: tremor  HPI:                                                                                                                                          [name] is an 57 y.o. female admitted for acute on chronic hypoxic hypercarbic respiratory failure. She has O2 dependent stage 4 COPD and  grade I diastolic dysfunction (EF 25-18%).  Per note diagnosed with COPD 3 years ago with exacerbations every 6 months requiring hospitalization. On ED admission patients O2 saturations on 4L was 85%. Currently she is on 6 liters [place] and sating at 88%.  Per patient she has intermittent waxing and waning bilateral arm "jerking" which is noticed also when she is sleeping.  Currently "it is not to bad but can get worse".  She often will have to put her utensils or glass down and wait a few seconds before attempting to perform her tasks. She denies tremor at rest and denies any familial history of tremor.      Past Medical History  Diagnosis Date  . Coronary artery disease     S/P PCI of LAD with DES (12/2008). Total occlusion of RCA noted at that time., medically managed. ACS ruled out 03/2009 with Lexiscan myoview . Followed by [facility].  . Pulmonary hypertension     2-D Echo (98/4210) - Systolic pressure was moderately increased. PA peak pressure  24mHg. 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 Followed by Dr. WJoya Gaskins(Vel[facility] Heckler  . Diastolic dysfunction     2-D Echo (12/2008) - Normal LV Systolic funciton with EF 60-65%. Grade 1 diastolid dysfunction. No regional wall motion abnormalities. Moderate pulmonary HTN with PA peak pressure 522mg.  . Marland KitchenOPD (chronic obstructive pulmonary disease)     Severe. Gold Stage IV.  PFTs (12/2008) - severe obstructive airway disease. Active tobacco use. Requires 4L O2 at home.  . Pulmonary nodule,  right     Small right middle lobe nodule. Stable as of 12/2008.  . Marland Kitchenleep apnea     Presumptive. No documented sleep studies.   . Prediabetes 12/2008    HgbA1c 6.4 (12/2008)  . Hx MRSA infection     Recurrent MRSA thigh abscesses.  . Tobacco abuse     Ongoing.  . Obesity   . Hyperlipidemia   . GERD (gastroesophageal reflux disease)     S/P Nissen fundoplication.  . Shortness of breath   . CHF (congestive heart failure)   . HeZXYOFVWA(677.3    Past Surgical History  Procedure Laterality Date  . Total abdominal hysterectomy w/ bilateral salpingoophorectomy    . Nissen fundoplication  Family History  Problem Relation Age of Onset  . Heart disease Mother 65    Deceased from MI at 85yo  . Hypertension Mother   . Heart disease Father 61    Deceased of MI age 40yo  . Hypertension Father   . Hypertension Brother   . Lung cancer      Grandmother     Social History:  reports that she has been smoking Cigarettes.  She has a 40 pack-year smoking history. She has never used smokeless tobacco. She reports that she does not drink alcohol or use illicit drugs.  Allergies  Allergen Reactions  . Fluconazole Anaphylaxis    MEDICATIONS:                                                                                                                     Prior to Admission:  Prescriptions prior to admission  Medication Sig Dispense Refill  . Aspirin-Salicylamide-Caffeine (BC HEADACHE POWDER PO) Take 1 packet by mouth daily as needed. For pain       Scheduled: . albuterol  5 mg Nebulization Q6H  . azithromycin  500 mg Oral Daily  . furosemide  60 mg Intravenous Q6H  . heparin  5,000 Units Subcutaneous Q8H  . insulin aspart  0-24 Units Subcutaneous TID AC & HS  . insulin glargine  5 Units Subcutaneous QHS  . ipratropium  0.5 mg Nebulization Q6H  . nicotine  14 mg Transdermal Daily  . predniSONE  40 mg Oral Q breakfast  . sodium chloride  3 mL Intravenous Q12H     ROS:                                                                                                                                        History obtained from the patient  General ROS: negative for - chills, fatigue, fever, night sweats, weight gain or weight loss Psychological ROS: negative for - behavioral disorder, hallucinations, memory difficulties, mood swings or suicidal ideation Ophthalmic ROS: negative for - blurry vision, double vision, eye pain or loss of vision ENT ROS: negative for - epistaxis, nasal discharge, oral lesions, sore throat, tinnitus or vertigo Allergy and Immunology ROS: negative for - hives or itchy/watery eyes Hematological and Lymphatic ROS: negative for - bleeding problems, bruising or swollen lymph nodes Endocrine ROS: negative for - galactorrhea, hair pattern changes, polydipsia/polyuria or temperature intolerance Respiratory ROS: negative  for - cough, hemoptysis, shortness of breath or wheezing Cardiovascular ROS: negative for - chest pain, dyspnea on exertion, edema or irregular heartbeat Gastrointestinal ROS: negative for - abdominal pain, diarrhea, hematemesis, nausea/vomiting or stool incontinence Genito-Urinary ROS: negative for - dysuria, hematuria, incontinence or urinary frequency/urgency Musculoskeletal ROS: negative for - joint swelling or muscular weakness Neurological ROS: as noted in HPI Dermatological ROS: negative for rash and skin lesion changes   Blood pressure 133/92, pulse 71, temperature 98.7 F (37.1 C), temperature source Oral, resp. rate 20, height _0  (1.651 m), weight 110.4 kg (243 lb 6.2 oz), SpO2 88.00%.   Neurologic Examination:                                                                                                      Mental Status: Alert, oriented, thought content appropriate.  Speech fluent without evidence of aphasia.  Able to follow 3 step commands without difficulty. Cranial Nerves: II: Discs flat bilaterally; Visual  fields grossly normal, pupils equal, round, reactive to light and accommodation III,IV, VI: ptosis not present, extra-ocular motions intact bilaterally V,VII: smile symmetric, facial light touch sensation normal bilaterally VIII: hearing normal bilaterally IX,X: gag reflex present XI: bilateral shoulder shrug XII: midline tongue extension Motor: 4/5 throughout --non tremor at rest, when arms held out reached patient showed intermittent quick, brief loss of tone in arms and wrist very much like asterixis Tone and bulk:normal tone throughout; no atrophy noted Sensory: Pinprick and light touch intact throughout, bilaterally Deep Tendon Reflexes:  Right: Upper Extremity   Left: Upper extremity   biceps (C-5 to C-6) 2/4   biceps (C-5 to C-6) 2/4 tricep (C7) 2/4    triceps (C7) 2/4 Brachioradialis (C6) 2/4  Brachioradialis (C6) 2/4  Lower Extremity Lower Extremity  quadriceps (L-2 to L-4) 2/4   quadriceps (L-2 to L-4) 2/4 Achilles (S1) 0/4   Achilles (S1) 0/4  Plantars: Right: downgoing   Left: downgoing Cerebellar: normal finger-to-nose,  normal heel-to-shin test CV: pulses palpable throughout    Lab Results  Component Value Date/Time   CHOL 191 09/08/2010  2:30 AM    Results for orders placed during the hospital encounter of 11/05/12 (from the past 48 hour(s))  CBC WITH DIFFERENTIAL     Status: Abnormal   Collection Time    11/05/12  3:53 PM      Result Value Range   WBC 11.3 (*) 4.0 - 10.5 K/uL   RBC 4.46  3.87 - 5.11 MIL/uL   Hemoglobin 13.7  12.0 - 15.0 g/dL   HCT 43.6  36.0 - 46.0 %   MCV 97.8  78.0 - 100.0 fL   MCH 30.7  26.0 - 34.0 pg   MCHC 31.4  30.0 - 36.0 g/dL   RDW 13.3  11.5 - 15.5 %   Platelets 198  150 - 400 K/uL   Neutrophils Relative % 75  43 - 77 %   Neutro Abs 8.5 (*) 1.7 - 7.7 K/uL   Lymphocytes Relative 12  12 - 46 %   Lymphs Abs 1.3  0.7 -  4.0 K/uL   Monocytes Relative 12  3 - 12 %   Monocytes Absolute 1.4 (*) 0.1 - 1.0 K/uL   Eosinophils Relative  1  0 - 5 %   Eosinophils Absolute 0.1  0.0 - 0.7 K/uL   Basophils Relative 0  0 - 1 %   Basophils Absolute 0.0  0.0 - 0.1 K/uL  BASIC METABOLIC PANEL     Status: Abnormal   Collection Time    11/05/12  3:53 PM      Result Value Range   Sodium 142  135 - 145 mEq/L   Potassium 3.6  3.5 - 5.1 mEq/L   Chloride 95 (*) 96 - 112 mEq/L   CO2 43 (*) 19 - 32 mEq/L   Comment: CRITICAL RESULT CALLED TO, READ BACK BY AND VERIFIED WITH:     RN MCKEOWN, A. 11/05/2012 1639 KERAN M.   Glucose, Bld 122 (*) 70 - 99 mg/dL   BUN 10  6 - 23 mg/dL   Creatinine, Ser 0.73  0.50 - 1.10 mg/dL   Calcium 9.1  8.4 - 10.5 mg/dL   GFR calc non Af Amer >90  >90 mL/min   GFR calc Af Amer >90  >90 mL/min   Comment:            The eGFR has been calculated     using the CKD EPI equation.     This calculation has not been     validated in all clinical     situations.     eGFR's persistently     <90 mL/min signify     possible Chronic Kidney Disease.  TROPONIN I     Status: None   Collection Time    11/05/12  3:53 PM      Result Value Range   Troponin I <0.30  <0.30 ng/mL   Comment:            Due to the release kinetics of cTnI,     a negative result within the first hours     of the onset of symptoms does not rule out     myocardial infarction with certainty.     If myocardial infarction is still suspected,     repeat the test at appropriate intervals.  PRO B NATRIURETIC PEPTIDE     Status: Abnormal   Collection Time    11/05/12  3:53 PM      Result Value Range   Pro B Natriuretic peptide (BNP) 4859.0 (*) 0 - 125 pg/mL  POCT I-STAT 3, BLOOD GAS (G3+)     Status: Abnormal   Collection Time    11/05/12  5:38 PM      Result Value Range   pH, Arterial 7.349 (*) 7.350 - 7.450   pCO2 arterial 79.0 (*) 35.0 - 45.0 mmHg   pO2, Arterial 58.0 (*) 80.0 - 100.0 mmHg   Bicarbonate 43.5 (*) 20.0 - 24.0 mEq/L   TCO2 46  0 - 100 mmol/L   O2 Saturation 86.0     Acid-Base Excess 14.0 (*) 0.0 - 2.0 mmol/L   Patient  temperature 98.6 F     Collection site RADIAL, ALLEN'S TEST ACCEPTABLE     Drawn by RT     Sample type ARTERIAL     Comment NOTIFIED PHYSICIAN    MRSA PCR SCREENING     Status: None   Collection Time    11/05/12  8:05 PM      Result Value Range  MRSA by PCR NEGATIVE  NEGATIVE   Comment:            The GeneXpert MRSA Assay (FDA     approved for NASAL specimens     only), is one component of a     comprehensive MRSA colonization     surveillance program. It is not     intended to diagnose MRSA     infection nor to guide or     monitor treatment for     MRSA infections.  CULTURE, BLOOD (ROUTINE X 2)     Status: None   Collection Time    11/05/12  8:20 PM      Result Value Range   Specimen Description BLOOD RIGHT ARM     Special Requests BOTTLES DRAWN AEROBIC AND ANAEROBIC 10CC EA     Culture  Setup Time       Value: 11/06/2012 05:55     Performed at [facility]   Culture       Value:        BLOOD CULTURE RECEIVED NO GROWTH TO DATE CULTURE WILL BE HELD FOR 5 DAYS BEFORE ISSUING A FINAL NEGATIVE REPORT     Performed at [facility]   Report Status PENDING    CULTURE, BLOOD (ROUTINE X 2)     Status: None   Collection Time    11/05/12  8:35 PM      Result Value Range   Specimen Description BLOOD RIGHT HAND     Special Requests BOTTLES DRAWN AEROBIC AND ANAEROBIC 10CC EA     Culture  Setup Time       Value: 11/06/2012 05:55     Performed at [facility]   Culture       Value:        BLOOD CULTURE RECEIVED NO GROWTH TO DATE CULTURE WILL BE HELD FOR 5 DAYS BEFORE ISSUING A FINAL NEGATIVE REPORT     Performed at [facility]   Report Status PENDING    HIV ANTIBODY (ROUTINE TESTING)     Status: None   Collection Time    11/05/12  8:45 PM      Result Value Range   HIV NON REACTIVE  NON REACTIVE   Comment: Performed at [place]     Status: None   Collection Time    11/05/12  8:45 PM      Result Value Range   Magnesium  1.8  1.5 - 2.5 mg/dL  PHOSPHORUS     Status: None   Collection Time    11/05/12  8:45 PM      Result Value Range   Phosphorus 2.5  2.3 - 4.6 mg/dL  TSH     Status: None   Collection Time    11/05/12  8:45 PM      Result Value Range   TSH 0.708  0.350 - 4.500 uIU/mL   Comment: Performed at [facility]  TROPONIN I     Status: None   Collection Time    11/05/12  8:45 PM      Result Value Range   Troponin I <0.30  <0.30 ng/mL   Comment:            Due to the release kinetics of cTnI,     a negative result within the first hours     of the onset of symptoms does not rule out     myocardial infarction with  certainty.     If myocardial infarction is still suspected,     repeat the test at appropriate intervals.  LEGIONELLA ANTIGEN, URINE     Status: None   Collection Time    11/05/12  9:15 PM      Result Value Range   Specimen Description URINE, CATHETERIZED     Special Requests NONE     Legionella Antigen, Urine       Value: Negative for Legionella pneumophilia serogroup 1     Performed at [facility]   Report Status 11/06/2012 FINAL    STREP PNEUMONIAE URINARY ANTIGEN     Status: None   Collection Time    11/05/12  9:15 PM      Result Value Range   Strep Pneumo Urinary Antigen NEGATIVE  NEGATIVE   Comment:            Infection due to S. pneumoniae     cannot be absolutely ruled out     since the antigen present     may be below the detection limit     of the test.  URINALYSIS, ROUTINE W REFLEX MICROSCOPIC     Status: None   Collection Time    11/05/12  9:15 PM      Result Value Range   Color, Urine YELLOW  YELLOW   APPearance CLEAR  CLEAR   Specific Gravity, Urine 1.007  1.005 - 1.030   pH 5.0  5.0 - 8.0   Glucose, UA NEGATIVE  NEGATIVE mg/dL   Hgb urine dipstick NEGATIVE  NEGATIVE   Bilirubin Urine NEGATIVE  NEGATIVE   Ketones, ur NEGATIVE  NEGATIVE mg/dL   Protein, ur NEGATIVE  NEGATIVE mg/dL   Urobilinogen, UA 0.2  0.0 - 1.0 mg/dL   Nitrite  NEGATIVE  NEGATIVE   Leukocytes, UA NEGATIVE  NEGATIVE   Comment: MICROSCOPIC NOT DONE ON URINES WITH NEGATIVE PROTEIN, BLOOD, LEUKOCYTES, NITRITE, OR GLUCOSE <1000 mg/dL.  BASIC METABOLIC PANEL     Status: Abnormal   Collection Time    11/06/12  2:18 AM      Result Value Range   Sodium 140  135 - 145 mEq/L   Potassium 3.3 (*) 3.5 - 5.1 mEq/L   Chloride 89 (*) 96 - 112 mEq/L   CO2 >45 (*) 19 - 32 mEq/L   Comment: CRITICAL RESULT CALLED TO, READ BACK BY AND VERIFIED WITH:     Judee Clara [id number] WILDERK   Glucose, Bld 240 (*) 70 - 99 mg/dL   BUN 13  6 - 23 mg/dL   Creatinine, Ser 0.78  0.50 - 1.10 mg/dL   Calcium 8.9  8.4 - 10.5 mg/dL   GFR calc non Af Amer >90  >90 mL/min   GFR calc Af Amer >90  >90 mL/min   Comment:            The eGFR has been calculated     using the CKD EPI equation.     This calculation has not been     validated in all clinical     situations.     eGFR's persistently     <90 mL/min signify     possible Chronic Kidney Disease.  CBC     Status: None   Collection Time    11/06/12  2:18 AM      Result Value Range   WBC 8.7  4.0 - 10.5 K/uL   RBC 4.49  3.87 - 5.11 MIL/uL   Hemoglobin  14.1  12.0 - 15.0 g/dL   HCT 43.5  36.0 - 46.0 %   MCV 96.9  78.0 - 100.0 fL   MCH 31.4  26.0 - 34.0 pg   MCHC 32.4  30.0 - 36.0 g/dL   RDW 13.2  11.5 - 15.5 %   Platelets 211  150 - 400 K/uL  TROPONIN I     Status: None   Collection Time    11/06/12  2:18 AM      Result Value Range   Troponin I <0.30  <0.30 ng/mL   Comment:            Due to the release kinetics of cTnI,     a negative result within the first hours     of the onset of symptoms does not rule out     myocardial infarction with certainty.     If myocardial infarction is still suspected,     repeat the test at appropriate intervals.  TROPONIN I     Status: None   Collection Time    11/06/12 10:40 AM      Result Value Range   Troponin I <0.30  <0.30 ng/mL   Comment:            Due to the  release kinetics of cTnI,     a negative result within the first hours     of the onset of symptoms does not rule out     myocardial infarction with certainty.     If myocardial infarction is still suspected,     repeat the test at appropriate intervals.  HEMOGLOBIN A1C     Status: Abnormal   Collection Time    11/06/12  2:11 PM      Result Value Range   Hemoglobin A1C 5.8 (*) <5.7 %   Comment: (NOTE)                                                                               According to the ADA Clinical Practice Recommendations for 2011, when     HbA1c is used as a screening test:      >=6.5%   Diagnostic of Diabetes Mellitus               (if abnormal result is confirmed)     5.7-6.4%   Increased risk of developing Diabetes Mellitus     References:Diagnosis and Classification of Diabetes Mellitus,Diabetes     EAVW,0981,19(JYNWG 1):S62-S69 and Standards of Medical Care in             Diabetes - 2011,Diabetes Care,2011,34 (Suppl 1):S11-S61.   Mean Plasma Glucose 120 (*) <117 mg/dL   Comment: Performed at [place], [place]     Status: Abnormal   Collection Time    11/06/12  4:16 PM      Result Value Range   Glucose-Capillary 348 (*) 70 - 99 mg/dL  GLUCOSE, [facility]     Status: Abnormal   Collection Time    11/06/12  9:15 PM      Result Value Range   Glucose-Capillary 176 (*) 70 - 99 mg/dL  BASIC METABOLIC PANEL  Status: Abnormal   Collection Time    11/07/12  5:00 AM      Result Value Range   Sodium 141  135 - 145 mEq/L   Potassium 4.4  3.5 - 5.1 mEq/L   Comment: DELTA CHECK NOTED     NO VISIBLE HEMOLYSIS   Chloride 90 (*) 96 - 112 mEq/L   CO2 45 (*) 19 - 32 mEq/L   Comment: CRITICAL RESULT CALLED TO, READ BACK BY AND VERIFIED WITH:     A WATSON,RN [id number] WILDERK 0656   Glucose, Bld 140 (*) 70 - 99 mg/dL   BUN 26 (*) 6 - 23 mg/dL   Comment: DELTA CHECK NOTED   Creatinine, Ser 0.80  0.50 - 1.10 mg/dL   Calcium 9.3  8.4 - 10.5 mg/dL   GFR  calc non Af Amer 80 (*) >90 mL/min   GFR calc Af Amer >90  >90 mL/min   Comment:            The eGFR has been calculated     using the CKD EPI equation.     This calculation has not been     validated in all clinical     situations.     eGFR's persistently     <90 mL/min signify     possible Chronic Kidney Disease.  CBC     Status: Abnormal   Collection Time    11/07/12  5:00 AM      Result Value Range   WBC 18.5 (*) 4.0 - 10.5 K/uL   RBC 4.60  3.87 - 5.11 MIL/uL   Hemoglobin 13.6  12.0 - 15.0 g/dL   HCT 44.1  36.0 - 46.0 %   MCV 95.9  78.0 - 100.0 fL   MCH 29.6  26.0 - 34.0 pg   MCHC 30.8  30.0 - 36.0 g/dL   RDW 13.2  11.5 - 15.5 %   Platelets 248  150 - 400 K/uL  MAGNESIUM     Status: None   Collection Time    11/07/12  5:00 AM      Result Value Range   Magnesium 2.2  1.5 - 2.5 mg/dL  GLUCOSE, [facility]     Status: Abnormal   Collection Time    11/07/12  6:12 AM      Result Value Range   Glucose-Capillary 144 (*) 70 - 99 mg/dL  GLUCOSE, CAPILL[facility]     Status: Abnormal   Collection Time    11/07/12 11:22 AM      Result Value Range   Glucose-Capillary 193 (*) 70 - 99 mg/dL    Dg Chest 2 View  11/06/2012   *RADIOLOGY REPORT*  Clinical Data: Dyspnea  CHEST - 2 VIEW  Comparison: 11/07/2010 609 hours  Findings: The cardiac shadow is again enlarged in size.  Patchy bilateral air elects cyst is noted.  There is also evidence of interstitial edema.  The overall appearance is slightly increased when compared the prior exam.  IMPRESSION: Increasing bilateral atelectasis as well as some pulmonary edema changes.   Original Report Authenticated By: Inez Catalina, M.D.   Dg Chest Port 1 View  11/06/2012   *RADIOLOGY REPORT*  Clinical Data: Pneumonia.  PORTABLE CHEST - 1 VIEW  Comparison: 11/05/2012  Findings: Cardiomegaly with vascular congestion.  Diffuse bilateral airspace opacities throughout the lungs.  This presumably represents edema.  No visible significant effusion.  No acute bony  abnormality. Improvement in the focal opacity in the right upper lobe.  IMPRESSION:  Cardiomegaly, vascular congestion and bilateral airspace opacities, most likely edema.   Original Report Authenticated By: Rolm Baptise, M.D.   Dg Chest Portable 1 View  11/05/2012   *RADIOLOGY REPORT*  Clinical Data: Respiratory distress, shortness of breath.  PORTABLE CHEST - 1 VIEW  Comparison: 08/11/2012  Findings: Cardiomegaly.  Vascular congestion.  Bilateral lower lobe atelectasis or infiltrates.  Airspace opacity in the right upper lobe peripherally.  Small bilateral pleural effusions.  Diffuse interstitial prominence could reflect interstitial edema.  IMPRESSION: Cardiomegaly with vascular congestion and possible interstitial edema.  More focal opacities in the lung bases and inferior right upper lobe.  This could represent atelectasis or infiltrates.  Small effusions.   Original Report Authenticated By: Rolm Baptise, M.D.    Assessment and plan discussed with with attending physician and they are in agreement.    Etta Quill PA-C [facility] [phone]  11/07/2012, 1:06 PM I have seen and evaluated the patient. I have reviewed the above note and made appropriate changes.   Assessment/Plan:  57 year old female with COPD and asterixis on exam. Asterixis is associated both with respiratory acidosis as well as hypoxia, I suspect that this secondary to her lung disease. She does not have any other findings to suggest liver dysfunction or renal dysfunction.  Treatment for this would be supportive, treating the underlying conditions that are associated with it.  No further recommendations at this time, please call if any further neurological questions remain.  Roland Rack, MD [facility] [phone]  If 7pm- 7am, please page neurology on call at [phone].

## 2012-11-07 NOTE — Progress Notes (Signed)
Pt. Very upset.  Wants to go home.  States her daughter is "upset with her" because the patient isn't at home to babysit.  States she is "claustrophobic" and can't stand the noises of a typical unit. I addressed the fact that we were continuing to treat her CHF and pneumonia, but she states, "I can take pills for that".  O2 sats continue to be 85-88% on 6 liters.  SOB with exertion.  When sitting bolt upright, patient is able to sat up to 90%.   MD notified.

## 2012-11-07 NOTE — Progress Notes (Signed)
Subjective: Pt seen and examined. No acute events overnight. Pt is feeling good and ready to go home. Denies fever, chills, dyspnea, CP, nausea, vomiting, abdominal pain, and increased leg swelling.    Objective: Vital signs in last 24 hours: Filed Vitals:   11/06/12 2105 11/07/12 0244 11/07/12 0453 11/07/12 0738  BP: 130/73  133/92   Pulse: 77 78 71   Temp: 98.3 F (36.8 C)  97.3 F (36.3 C)   TempSrc: Oral  Oral   Resp: _0 Height: 5' 5" (1.651 m)     Weight: 112.084 kg (247 lb 1.6 oz)  110.4 kg (243 lb 6.2 oz)   SpO2: 93% 94% 93% 90%   Weight change: 0.184 kg (6.5 oz)  Intake/Output Summary (Last 24 hours) at 11/07/12 0809 Last data filed at 11/07/12 0132  Gross per 24 hour  Intake 1227.17 ml  Output   1700 ml  Net -472.83 ml   Physical Exam: Constitutional: No distress.  HENT: Normocephalic  Neck: Normal range of motion. Neck supple.  Cardiovascular: Normal rate, regular rhythm, normal heart sounds and intact distal pulses.  1+ pitting edema to shins bilaterally  Pulmonary/Chest: No accessory muscle usage. No respiratory distress. She has rales in the right upper field, the right lower field and the left lower field.  Abdominal: Soft. Bowel sounds are normal.  Musculoskeletal: Normal range of motion.  Neurological: She is alert and oriented to person, place, and time. No cranial nerve deficit. GCS score is 15.  Skin: Skin is warm and dry. She is not diaphoretic    Lab Results: Basic Metabolic Panel:  Recent Labs Lab 11/05/12 1553 11/05/12 2045 11/06/12 0218 11/07/12 0500  NA 142  --  140 141  K 3.6  --  3.3* 4.4  CL 95*  --  89* 90*  CO2 43*  --  >45* 45*  GLUCOSE 122*  --  240* 140*  BUN 10  --  13 26*  CREATININE 0.73  --  0.78 0.80  CALCIUM 9.1  --  8.9 9.3  MG  --  1.8  --   --   PHOS  --  2.5  --   --    Liver Function Tests: No results found for this basename: AST, ALT, ALKPHOS, BILITOT, PROT, ALBUMIN,  in the last 168 hours No  results found for this basename: LIPASE, AMYLASE,  in the last 168 hours No results found for this basename: AMMONIA,  in the last 168 hours CBC:  Recent Labs Lab 11/05/12 1553 11/06/12 0218 11/07/12 0500  WBC 11.3* 8.7 18.5*  NEUTROABS 8.5*  --   --   HGB 13.7 14.1 13.6  HCT 43.6 43.5 44.1  MCV 97.8 96.9 95.9  PLT 198 211 248   Cardiac Enzymes:  Recent Labs Lab 11/05/12 2045 11/06/12 0218 11/06/12 1040  TROPONINI <0.30 <0.30 <0.30   BNP:  Recent Labs Lab 11/05/12 1553  PROBNP 4859.0*   D-Dimer: No results found for this basename: DDIMER,  in the last 168 hours CBG:  Recent Labs Lab 11/06/12 1616 11/06/12 2115 11/07/12 0612  GLUCAP 348* 176* 144*   Hemoglobin A1C:  Recent Labs Lab 11/06/12 1411  HGBA1C 5.8*   Fasting Lipid Panel: No results found for this basename: CHOL, HDL, LDLCALC, TRIG, CHOLHDL, LDLDIRECT,  in the last 168 hours Thyroid Function Tests:  Recent Labs Lab 11/05/12 2045  TSH 0.708   Coagulation: No results found for this basename: LABPROT, INR,  in the  last 168 hours Anemia Panel: No results found for this basename: VITAMINB12, FOLATE, FERRITIN, TIBC, IRON, RETICCTPCT,  in the last 168 hours Urine Drug Screen: Drugs of Abuse     Component Value Date/Time   LABOPIA NEGATIVE 02/04/2009 [place] 02/04/2009 [place] 02/04/2009 2315   AMPHETMU NEGATIVE 02/04/2009 2315    Alcohol Level: No results found for this basename: ETH,  in the last 168 hours Urinalysis:  Recent Labs Lab 11/05/12 2115  COLORURINE YELLOW  LABSPEC 1.007  PHURINE 5.0  [place]  UROBILINOGEN 0.2  NITRITE NEGATIVE  LEUKOCYTESUR NEGATIVE    Micro Results: Recent Results (from the past 240 hour(s))  MRSA PCR SCREENING     Status: None   Collection Time    11/05/12  8:05 PM      Result Value Range Status   MRSA by PCR NEGATIVE   NEGATIVE Final   Comment:            The GeneXpert MRSA Assay (FDA     approved for NASAL specimens     only), is one component of a     comprehensive MRSA colonization     surveillance program. It is not     intended to diagnose MRSA     infection nor to guide or     monitor treatment for     MRSA infections.   Studies/Results: Dg Chest 2 View  11/06/2012   *RADIOLOGY REPORT*  Clinical Data: Dyspnea  CHEST - 2 VIEW  Comparison: 11/07/2010 609 hours  Findings: The cardiac shadow is again enlarged in size.  Patchy bilateral air elects cyst is noted.  There is also evidence of interstitial edema.  The overall appearance is slightly increased when compared the prior exam.  IMPRESSION: Increasing bilateral atelectasis as well as some pulmonary edema changes.   Original Report Authenticated By: Inez Catalina, M.D.   Dg Chest Port 1 View  11/06/2012   *RADIOLOGY REPORT*  Clinical Data: Pneumonia.  PORTABLE CHEST - 1 VIEW  Comparison: 11/05/2012  Findings: Cardiomegaly with vascular congestion.  Diffuse bilateral airspace opacities throughout the lungs.  This presumably represents edema.  No visible significant effusion.  No acute bony abnormality. Improvement in the focal opacity in the right upper lobe.  IMPRESSION: Cardiomegaly, vascular congestion and bilateral airspace opacities, most likely edema.   Original Report Authenticated By: Rolm Baptise, M.D.   Dg Chest Portable 1 View  11/05/2012   *RADIOLOGY REPORT*  Clinical Data: Respiratory distress, shortness of breath.  PORTABLE CHEST - 1 VIEW  Comparison: 08/11/2012  Findings: Cardiomegaly.  Vascular congestion.  Bilateral lower lobe atelectasis or infiltrates.  Airspace opacity in the right upper lobe peripherally.  Small bilateral pleural effusions.  Diffuse interstitial prominence could reflect interstitial edema.  IMPRESSION: Cardiomegaly with vascular congestion and possible interstitial edema.  More focal opacities in the lung bases and inferior  right upper lobe.  This could represent atelectasis or infiltrates.  Small effusions.   Original Report Authenticated By: Rolm Baptise, M.D.   Medications: I have reviewed the patient's current medications. Scheduled Meds: . albuterol  5 mg Nebulization Q6H  . azithromycin  500 mg Oral Daily  . furosemide  60 mg Intravenous Q6H  . heparin  5,000 Units Subcutaneous Q8H  . insulin aspart  0-24 Units Subcutaneous TID AC & HS  . insulin glargine  5 Units Subcutaneous QHS  . ipratropium  0.5 mg Nebulization Q6H  . nicotine  14 mg Transdermal Daily  . predniSONE  40 mg Oral Q breakfast  . sodium chloride  3 mL Intravenous Q12H   Continuous Infusions:  PRN Meds:.sodium chloride, sodium chloride Assessment/Plan: Principal Problem:   Acute respiratory failure with hypoxia Active Problems:   OBESITY   TOBACCO ABUSE   CORONARY ARTERY DISEASE   PULMONARY HYPERTENSION, SECONDARY   CHRONIC OBSTRUCTIVE PULMONARY DISEASE   GASTROESOPHAGEAL REFLUX DISEASE   SLEEP APNEA   COPD (chronic obstructive pulmonary disease)   Hyperlipidemia   Coronary artery disease   Diastolic dysfunction   CHF (congestive heart failure)   Hypercapnic respiratory failure, chronic   Metabolic alkalosis   CAP (community acquired pneumonia)   Acute on chronic diastolic heart failure   COPD exacerbation  Pt is a 57 yo Caucasian female with multiple pmh significant for obesity, HL, GERD s/p fundoplication, CAD s/p DES (2010), pulmonary HTN, 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  # COPD exacerbation with chronic hypercapnia - O2 saturation 85 on 4LNC at presentation. Increase in SOB and cough x2 days. ABG shows a chronic hypercapnia with superimposed metabolic alkylosis.  - No BiPap given chronicity of her  hypercapnia  - 6L O2 via [place] with continuous pulse ox, goal O2 sat 88-92%  - Oral Prednisone 40 mg daily  - Duonebs q6h and PRN  - Ambulate order   # community acquired pneumonia - on admission WBC 11.3, multiple focal opacities on CXR, rales on physical exam.  - IV Azithromycin to PO Azithromycin Day 3/5 - Awaiting blood cultures  - Legionella antigen, strep pneumo urinary antigen negative ---> d/c ceftriaxone   - Incentive spirometry every 2 hours while awake   # possible acute on chronic CHF - BMP 4800. Lower extremity edema on exam. Denies orthopnea, PND.  8/5 CXR  increasing b/l atelectasis and pulmonary edema changes  - Increase IV Lasix 27m Q6H   - Check BMP and Mg levels x3 daily  - Obtain 2D- Echo to determine heart function - Obtain BNP - Strict I &O's and weight checks  # Hx of CAD - New inferior lateral T wave inversions as compared to prior EKG. No active chest pain or anginal symptoms.  - ASA 3259mgiven in ED  - Troponins neg x3  # tobacco dependence  - nicotine patch 1431m#hyperglycemia - most likely due steroids, HbA1c 5.8 - ISS  -Lantus 5U before bed   -Obtain HbA1c  #DVT PPX - Heparin subq  Dispo: Disposition is deferred at this time, awaiting improvement of current medical problems.  Anticipated discharge in approximately ? day(s).   The patient does have a current PCP (Provider Default, MD) and [facility].  The patient does have transportation limitations that hinder transportation to clinic appointments.  .Services Needed at time of discharge: Y = Yes, Blank = No PT: yes  OT:   RN:   Equipment:   Other:     LOS: 2 days   MarJuluis MireD 11/07/2012, 8:09 AM

## 2012-11-07 NOTE — Progress Notes (Addendum)
Echocardiogram 2D Echocardiogram has been performed.  11/07/2012 12:45 PM Maudry Mayhew, RVT, RDCS, RDMS

## 2012-11-07 NOTE — Progress Notes (Signed)
  Date: 11/07/2012  Patient name: [name]  Medical record number: [id number]  Date of birth: 09/05/1955   This patient has been seen and the plan of care was discussed with the house staff. Please see their note for complete details. I concur with their findings with the following additions/corrections:  Agree with escalation of IV diuresis. She has increasing pulmonary edema. Obtain TTE. She has known Pulm HTN and likely right sided failure. Concern for Left sided failure.  Follow BMP, Mg three times daily while on IV diuresis.  Watch telemetry.  Hemodynamics stable. Agree with PO prednisone and D/C rocephin.  Dominic Pea, DO 11/07/2012, 1:49 PM

## 2012-11-08 DIAGNOSIS — I2789 Other specified pulmonary heart diseases: Secondary | ICD-10-CM

## 2012-11-08 DIAGNOSIS — J9620 Acute and chronic respiratory failure, unspecified whether with hypoxia or hypercapnia: Secondary | ICD-10-CM

## 2012-11-08 DIAGNOSIS — I5031 Acute diastolic (congestive) heart failure: Secondary | ICD-10-CM

## 2012-11-08 DIAGNOSIS — R7303 Prediabetes: Secondary | ICD-10-CM | POA: Diagnosis present

## 2012-11-08 DIAGNOSIS — I2510 Atherosclerotic heart disease of native coronary artery without angina pectoris: Secondary | ICD-10-CM

## 2012-11-08 LAB — GLUCOSE, CAPILLARY
Glucose-Capillary: 110 mg/dL — ABNORMAL HIGH (ref 70–99)
Glucose-Capillary: 224 mg/dL — ABNORMAL HIGH (ref 70–99)

## 2012-11-08 LAB — BASIC METABOLIC PANEL
CO2: >45 mEq/L (ref 19–32)
CO2: >45 mEq/L (ref 19–32)
Chloride: 83 mEq/L — ABNORMAL LOW (ref 96–112)
Chloride: 83 mEq/L — ABNORMAL LOW (ref 96–112)
GFR calc Af Amer: 88 mL/min — ABNORMAL LOW (ref >90)
Glucose, Bld: 177 mg/dL — ABNORMAL HIGH (ref 70–99)
Potassium: 3.5 mEq/L (ref 3.5–5.1)
Potassium: 3.9 mEq/L (ref 3.5–5.1)
Sodium: 140 mEq/L (ref 135–145)
Sodium: 138 mEq/L (ref 135–145)

## 2012-11-08 LAB — URINALYSIS, ROUTINE W REFLEX MICROSCOPIC
Bilirubin Urine: NEGATIVE
Glucose, UA: NEGATIVE mg/dL
Hgb urine dipstick: NEGATIVE
Ketones, ur: NEGATIVE mg/dL
Specific Gravity, Urine: 1.014 (ref 1.005–1.030)
pH: 6 (ref 5.0–8.0)

## 2012-11-08 LAB — MAGNESIUM
Magnesium: 2.1 mg/dL (ref 1.5–2.5)
Magnesium: 2.2 mg/dL (ref 1.5–2.5)

## 2012-11-08 MED ORDER — SODIUM CHLORIDE 0.9 % IV SOLN: 250.0000 mL | INTRAVENOUS | Status: DC | PRN

## 2012-11-08 MED ORDER — FUROSEMIDE 10 MG/ML IJ SOLN
60.0000 mg | Freq: Three times a day (TID) | INTRAMUSCULAR | Status: DC
  Administered 2012-11-08 – 2012-11-09 (×2): 60 mg via INTRAVENOUS
  Filled 2012-11-08 (×3): qty 6

## 2012-11-08 MED ORDER — SODIUM CHLORIDE 0.9 % IJ SOLN
3.0000 mL | Freq: Two times a day (BID) | INTRAMUSCULAR | Status: DC
  Administered 2012-11-08 – 2012-11-09 (×3): 3 mL via INTRAVENOUS

## 2012-11-08 MED ORDER — SODIUM CHLORIDE 0.9 % IV SOLN
INTRAVENOUS | Status: DC
  Administered 2012-11-09: 1000 mL via INTRAVENOUS
  Administered 2012-11-09: 20 mL via INTRAVENOUS

## 2012-11-08 MED ORDER — SODIUM CHLORIDE 0.9 % IJ SOLN: 3.0000 mL | INTRAMUSCULAR | Status: DC | PRN

## 2012-11-08 MED ORDER — LORAZEPAM 0.5 MG PO TABS
0.5000 mg | ORAL_TABLET | Freq: Once | ORAL | Status: AC
  Administered 2012-11-08: 0.5 mg via ORAL
  Filled 2012-11-08: qty 1

## 2012-11-08 MED ORDER — ATORVASTATIN CALCIUM 20 MG PO TABS
20.0000 mg | ORAL_TABLET | Freq: Every day | ORAL | Status: DC
  Administered 2012-11-08 – 2012-11-09 (×2): 20 mg via ORAL
  Filled 2012-11-08 (×2): qty 1

## 2012-11-08 MED ORDER — ASPIRIN 81 MG PO CHEW
81.0000 mg | CHEWABLE_TABLET | Freq: Every day | ORAL | Status: DC
  Administered 2012-11-08 – 2012-11-09 (×2): 81 mg via ORAL
  Filled 2012-11-08 (×2): qty 1

## 2012-11-08 MED ORDER — POTASSIUM CHLORIDE CRYS ER 20 MEQ PO TBCR
40.0000 meq | EXTENDED_RELEASE_TABLET | Freq: Once | ORAL | Status: AC
  Administered 2012-11-08: 40 meq via ORAL
  Filled 2012-11-08 (×2): qty 1

## 2012-11-08 NOTE — Progress Notes (Signed)
Received referral for [facility] Care Management services. Will come to speak with patient regarding [place] Management services. Marthenia Rolling, MSN-Ed, RN,BSN- [facility] [phone]

## 2012-11-08 NOTE — Progress Notes (Signed)
Report given to receiving RN. Patient is stable with no complaints and no signs or symptoms of distress or discomfort.

## 2012-11-08 NOTE — Plan of Care (Signed)
Problem: Food- and Nutrition-Related Knowledge Deficit (NB-1.1) Goal: Nutrition education Formal process to instruct or train a patient/client in a skill or to impart knowledge to help patients/clients voluntarily manage or modify food choices and eating behavior to maintain or improve health. Outcome: Completed/Met Date Met:  11/08/12  Nutrition Education Note  RD consulted for nutrition education regarding new onset CHF. Pt reports that she has difficulty purchasing healthy foods. She doesn't like to eat vegetables. Dietary recall reveals: pizza, pastas, meats, "lots of macaroni and cheese."  RD provided "Low Sodium Nutrition Therapy" handout from the Academy of [employer]etics. Reviewed patient's dietary recall. Provided examples on ways to decrease sodium intake in diet. Discouraged intake of processed foods and use of salt shaker. Encouraged fresh fruits and vegetables as well as whole grain sources of carbohydrates to maximize fiber intake.   RD discussed why it is important for patient to adhere to diet recommendations, and emphasized the role of fluids, foods to avoid, and importance of weighing self daily. Teach back method used.  Expect poor compliance.  Body mass index is 39.61 kg/(m^2). Pt meets criteria for Obese Class II based on current BMI.  Current diet order is Carbohydrate Modified Medium, patient is consuming approximately 100% of meals at this time. Labs and medications reviewed. No further nutrition interventions warranted at this time. RD contact information provided. If additional nutrition issues arise, please re-consult RD.   Inda Coke MS, RD, LDN Pager: [phone] After-hours pager: [phone]

## 2012-11-08 NOTE — Progress Notes (Signed)
  Date: 11/08/2012  Patient name: [name]  Medical record number: [id number]  Date of birth: 1956-01-11   This patient has been seen and the plan of care was discussed with the house staff. Please see their note for complete details. I concur with their findings with the following additions/corrections:  She is -8.4 L since admission.  Continues to diuresis well on IV lasix.  Follow K, Mg every 8 hours.  Consulted HF team.  She is medically improving.  Plans for likely RHC due to elevated PA pressures.  Agree she likely has component of OHS/OSA, will need sleep study.  Dominic Pea, DO 11/08/2012, 2:21 PM

## 2012-11-08 NOTE — Progress Notes (Addendum)
Subjective: Pt seen and examined. No acute events overnight. She feels that her breathing has improved.  Denies fever, chills, dyspnea, CP, nausea, vomiting, abdominal pain, and increased leg swelling.    Objective: Vital signs in last 24 hours: Filed Vitals:   11/07/12 2016 11/08/12 0235 11/08/12 0538 11/08/12 0729  BP:   134/86   Pulse: 78 65 79   Temp:   98 F (36.7 C)   TempSrc:      Resp: _0 Height:      Weight:   107.956 kg (238 lb)   SpO2: 90% 92% 90% 90%   Weight change: -4.128 kg (-9 lb 1.6 oz)  Intake/Output Summary (Last 24 hours) at 11/08/12 0741 Last data filed at 11/08/12 0500  Gross per 24 hour  Intake    920 ml  Output   6450 ml  Net  -5530 ml   Physical Exam: Constitutional: No distress.  HENT: Normocephalic  Neck: Normal range of motion. Neck supple.  Cardiovascular: Normal rate, regular rhythm, normal heart sounds and intact distal pulses.  1+ pitting edema to shins bilaterally  Pulmonary/Chest: No accessory muscle usage. No respiratory distress. She has rales in the rightt lower field and the left lower field.  Abdominal: Soft. Bowel sounds are normal.  Musculoskeletal: Normal range of motion.  Neurological: She is alert and oriented to person, place, and time. .      Lab Results: Basic Metabolic Panel:  Recent Labs Lab 11/05/12 1553  11/05/12 2045  11/07/12 0500 11/07/12 1734  NA 142  --   --   < > 141 138  K 3.6  --   --   < > 4.4 4.1  CL 95*  --   --   < > 90* 84*  CO2 43*  --   --   < > 45* >45*  GLUCOSE 122*  --   --   < > 140* 229*  BUN 10  --   --   < > 26* 31*  CREATININE 0.73  --   --   < > 0.80 0.85  CALCIUM 9.1  --   --   < > 9.3 9.4  MG  --   < > 1.8  --  2.2 2.1  PHOS  --   --  2.5  --   --   --   < > = values in this interval not displayed. Liver Function Tests:  Recent Labs Lab 11/07/12 1734  AST 12  ALT 14  ALKPHOS 116  BILITOT 0.2*  PROT 7.0  ALBUMIN 3.0*   No results found for this basename:  LIPASE, AMYLASE,  in the last 168 hours  Recent Labs Lab 11/07/12 1644  AMMONIA 33   CBC:  Recent Labs Lab 11/05/12 1553 11/06/12 0218 11/07/12 0500  WBC 11.3* 8.7 18.5*  NEUTROABS 8.5*  --   --   HGB 13.7 14.1 13.6  HCT 43.6 43.5 44.1  MCV 97.8 96.9 95.9  PLT 198 211 248   Cardiac Enzymes:  Recent Labs Lab 11/05/12 2045 11/06/12 0218 11/06/12 1040  TROPONINI <0.30 <0.30 <0.30   BNP:  Recent Labs Lab 11/05/12 1553  PROBNP 4859.0*   D-Dimer: No results found for this basename: DDIMER,  in the last 168 hours CBG:  Recent Labs Lab 11/06/12 2115 11/07/12 0612 11/07/12 1122 11/07/12 1559 11/07/12 2102 11/08/12 0620  GLUCAP 176* 144* 193* 199* 152* 110*   Hemoglobin A1C:  Recent Labs Lab 11/06/12  1411  HGBA1C 5.8*   Fasting Lipid Panel: No results found for this basename: CHOL, HDL, LDLCALC, TRIG, CHOLHDL, LDLDIRECT,  in the last 168 hours Thyroid Function Tests:  Recent Labs Lab 11/05/12 2045  TSH 0.708   Coagulation: No results found for this basename: LABPROT, INR,  in the last 168 hours Anemia Panel: No results found for this basename: VITAMINB12, FOLATE, FERRITIN, TIBC, IRON, RETICCTPCT,  in the last 168 hours Urine Drug Screen: Drugs of Abuse     Component Value Date/Time   LABOPIA NEGATIVE 02/04/2009 [place] 02/04/2009 [place] 02/04/2009 [place] 02/04/2009 2315    Alcohol Level: No results found for this basename: ETH,  in the last 168 hours Urinalysis:  Recent Labs Lab 11/05/12 2115 11/07/12 1509  COLORURINE YELLOW YELLOW  LABSPEC 1.007 1.014  PHURINE 5.0 6.0  GLUCOSEU NEGATIVE NEGATIVE  HGBUR NEGATIVE NEGATIVE  BILIRUBINUR NEGATIVE NEGATIVE  KETONESUR NEGATIVE NEGATIVE  PROTEINUR NEGATIVE NEGATIVE  UROBILINOGEN 0.2 1.0  NITRITE NEGATIVE NEGATIVE  LEUKOCYTESUR NEGATIVE NEGATIVE    Micro Results: Recent Results (from the past 240 hour(s))  MRSA PCR SCREENING      Status: None   Collection Time    11/05/12  8:05 PM      Result Value Range Status   MRSA by PCR NEGATIVE  NEGATIVE Final   Comment:            The GeneXpert MRSA Assay (FDA     approved for NASAL specimens     only), is one component of a     comprehensive MRSA colonization     surveillance program. It is not     intended to diagnose MRSA     infection nor to guide or     monitor treatment for     MRSA infections.  CULTURE, BLOOD (ROUTINE X 2)     Status: None   Collection Time    11/05/12  8:20 PM      Result Value Range Status   Specimen Description BLOOD RIGHT ARM   Final   Special Requests BOTTLES DRAWN AEROBIC AND ANAEROBIC 10CC EA   Final   Culture  Setup Time     Final   Value: 11/06/2012 05:55     Performed at [facility]   Culture     Final   Value:        BLOOD CULTURE RECEIVED NO GROWTH TO DATE CULTURE WILL BE HELD FOR 5 DAYS BEFORE ISSUING A FINAL NEGATIVE REPORT     Performed at [facility]   Report Status PENDING   Incomplete  CULTURE, BLOOD (ROUTINE X 2)     Status: None   Collection Time    11/05/12  8:35 PM      Result Value Range Status   Specimen Description BLOOD RIGHT HAND   Final   Special Requests BOTTLES DRAWN AEROBIC AND ANAEROBIC 10CC EA   Final   Culture  Setup Time     Final   Value: 11/06/2012 05:55     Performed at [facility]   Culture     Final   Value:        BLOOD CULTURE RECEIVED NO GROWTH TO DATE CULTURE WILL BE HELD FOR 5 DAYS BEFORE ISSUING A FINAL NEGATIVE REPORT     Performed at [facility]   Report Status PENDING   Incomplete   Studies/Results: Dg Chest 2 View  11/06/2012   *  RADIOLOGY REPORT*  Clinical Data: Dyspnea  CHEST - 2 VIEW  Comparison: 11/07/2010 609 hours  Findings: The cardiac shadow is again enlarged in size.  Patchy bilateral air elects cyst is noted.  There is also evidence of interstitial edema.  The overall appearance is slightly increased when compared the prior exam.  IMPRESSION:  Increasing bilateral atelectasis as well as some pulmonary edema changes.   Original Report Authenticated By: Inez Catalina, M.D.   Medications: I have reviewed the patient's current medications. Scheduled Meds: . albuterol  5 mg Nebulization Q6H  . aspirin  81 mg Oral Daily  . atorvastatin  20 mg Oral q1800  . azithromycin  500 mg Oral Daily  . furosemide  60 mg Intravenous TID AC  . heparin  5,000 Units Subcutaneous Q8H  . insulin aspart  0-24 Units Subcutaneous TID AC & HS  . insulin glargine  5 Units Subcutaneous QHS  . ipratropium  0.5 mg Nebulization Q6H  . nicotine  14 mg Transdermal Daily  . predniSONE  40 mg Oral Q breakfast  . sodium chloride  3 mL Intravenous Q12H  . sodium chloride  3 mL Intravenous Q12H   Continuous Infusions:  PRN Meds:.sodium chloride, acetaminophen, sodium chloride Assessment/Plan: Principal Problem:   Acute respiratory failure with hypoxia Active Problems:   OBESITY   TOBACCO ABUSE   CORONARY ARTERY DISEASE   PULMONARY HYPERTENSION, SECONDARY   CHRONIC OBSTRUCTIVE PULMONARY DISEASE   GASTROESOPHAGEAL REFLUX DISEASE   SLEEP APNEA   COPD (chronic obstructive pulmonary disease)   Hyperlipidemia   Coronary artery disease   Diastolic dysfunction   CHF (congestive heart failure)   Hypercapnic respiratory failure, chronic   Metabolic alkalosis   CAP (community acquired pneumonia)   Acute on chronic diastolic heart failure   COPD exacerbation   Asterixis   Pre-diabetes  Pt is a 57 yo Caucasian female with multiple pmh significant for obesity, HL, GERD s/p fundoplication, CAD s/p DES (2010), pulmonary HTN, 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  # COPD exacerbation with chronic hypercapnia - O2 saturation 85 on 4LNC at presentation.  Increase in SOB and cough x2 days. ABG shows a chronic hypercapnia with superimposed metabolic alkalosis.  - No BiPap given chronicity of her hypercapnia  - Per cardiology 4L O2 via [place] with continuous pulse ox, goal O2 sat 88-92%  - Oral Prednisone 40 mg daily  - Duonebs q6h and PRN  - Ambulate order  -Per cardiology, needs pulmonology f/u and sleep study   # Acute on chronic CHF - BMP 4800. Lower extremity edema on exam. Denies orthopnea, PND.  8/5 CXR  increasing b/l atelectasis and pulmonary edema changes  - Continue IV Lasix 59m Q6H   - Check BMP and Mg levels x3 daily  - 2D- Echo with EF 55-60%, Pulmonary HTN 639mg ---> per CHF team RHC for tomm AM  - Strict I &O's - 1.7L out in last day. 8.1L since admission - Obtain daily weight - 5lb wt loss in last day, 7lb since admission - Adhere to salt restricted diet and fluid restriction <1500cc   # community acquired pneumonia - on admission WBC 11.3, multiple focal opacities on CXR, rales on physical exam.  - PO Azithromycin Day 4/5, s/p IV ceftriaxone and axithromycin - Awaiting blood cultures  -  Legionella antigen, strep pneumo urinary antigen negative   - Incentive spirometry every 2 hours while awake    # Hx of CAD - New inferior lateral T wave inversions as compared to prior EKG. No active chest pain or anginal symptoms. Troponins neg x 3 - Continue ASA 34m  - Continue Atorvastatin 213m- Obtain lipid panel   #hyperglycemia - due to pre-diabetes HbA1c 5.8 and steroids -Continue ISS  -Continue Lantus 5U before bed    # tobacco dependence  - nicotine patch 1417m-smoking cessation counseling   #DVT PPX - Heparin subq  Dispo: Disposition is deferred at this time, awaiting improvement of current medical problems.  Anticipated discharge in approximately ? day(s).   The patient does have a current PCP (Provider Default, MD) and [facility].  The patient does have  transportation limitations that hinder transportation to clinic appointments.  .Services Needed at time of discharge: Y = Yes, Blank = No PT: yes  OT:   RN:   Equipment:   Other:     LOS: 3 days   MarJuluis MireD 11/08/2012, 7:41 AM

## 2012-11-08 NOTE — Progress Notes (Signed)
Co-signed for Philemon Kingdom RN for medications administrations, assessments, progress notes, I & O's, care plans, and patient education. Tonny Branch, RN, BSN

## 2012-11-08 NOTE — Progress Notes (Addendum)
CRITICAL VALUE ALERT  Critical value received: CO2 greater than 45  Date of notification:  11-08-12  Time of notification:  1038AM  Critical value read back:yes  Nurse who received alert:  Lonzo Cloud RN  MD notified (1st page):  Dr. Naaman Plummer   Time of first page:  1050AM  MD notified (2nd page):  Time of second page:  Responding MD: Dr. Naaman Plummer   Time MD responded:  1053AM  No new orders given.

## 2012-11-08 NOTE — Progress Notes (Signed)
CRITICAL VALUE ALERT  Critical value received:  CO2 >45  Date of notification:  11/08/12  Time of notification:  1312  Critical value read back:yes  Nurse who received alert:  Gae Gallop RN  MD notified (1st page):  Dr. Naaman Plummer  Time of first page:  1313  MD notified (2nd page):  Time of second page:  Responding MD:  Dr. Naaman Plummer  Time MD responded:  [phone]

## 2012-11-08 NOTE — Consult Note (Signed)
Advanced Heart Failure Team Consult Note  Referring Physician: Dr Murlean Caller Primary Physician: Primary Cardiologist:    Reason for Consultation: Diastolic Heart Failure  HPI:    The Heart Failure Team was asked to provide a consult for diastolic heart failure.   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   Prior to admit she had not been taking any medications for at least 6 months. She admits to difficulty paying for medications.  She says the only time she sees a physician is during hospital admit. Unable to follow for appointments due to transportation.  Prior to admit she had been smoking 1/2 pack per day. Complains of difficulty performing ADLS. Able to walk short distances but required frequent rest breaks. She does not weight at home. Lives at home with grandson.   She presented to [facility] ED with increased dyspnea on exertion. Admit weight 246 pounds. In the ED O2 sats 85%She has been diuresing with intermittent IV lasix. Weight trending down.   Pertinent admission labs include: Pro BNP 4859, Ces negative, TSH 0.7, HIV NR, CO2 43, Potassium 3.6, creatinine 0.73, WBC 11.3, and Hemoglobin 13.7.   Dyspnea improving. + Orthopnea.    11/07/12 ECHO EF 55-60% Peak PA pressure 64    Review of Systems: [y] = yes, _0  = no   General: Weight gain _1 ; Weight loss _2 ; Anorexia _3 ; Fatigue [Y ]; Fever _4 ; Chills _5 ; Weakness _6   Cardiac: Chest pain/pressure _7 ; Resting SOB [Y ]; Exertional SOB [Y ]; Orthopnea [Y ]; Pedal Edema _8 ; Palpitations _9 ; Syncope _10 ; Presyncope _11 ; Paroxysmal nocturnal dyspnea[Y ]  Pulmonary: Cough [Y ]; Wheezing_12 ; Hemoptysis_13 ; Sputum _14 ; Snoring _15   GI: Vomiting_16 ; Dysphagia_17 ; Melena_18 ; Hematochezia _19 ; Heartburn_20 ; Abdominal pain _21 ;  Constipation _22 ; Diarrhea _23 ; BRBPR _24   GU: Hematuria_25 ; Dysuria _26 ; Nocturia_27   Vascular: Pain in legs with walking _28 ; Pain in feet with lying flat _29 ; Non-healing sores _30 ; Stroke _31 ; TIA _32 ; Slurred speech _33 ;  Neuro: Headaches_34 ; Vertigo_35 ; Seizures_36 ; Paresthesias_37 ;Blurred vision _38 ; Diplopia _39 ; Vision changes _40   Ortho/Skin: Arthritis _41 ; Joint pain [Y ]; Muscle pain _42 ; Joint swelling _43 ; Back Pain _44 ; Rash _45   Psych: Depression_46 ; Anxiety_47   Heme: Bleeding problems _48 ; Clotting disorders _49 ; Anemia _50   Endocrine: Diabetes _51 ; Thyroid dysfunction_52   Home Medications Prior to Admission medications   Medication Sig Start Date End Date Taking? Authorizing Provider  Aspirin-Salicylamide-Caffeine (BC HEADACHE POWDER PO) Take 1 packet by mouth daily as needed. For pain   Yes Historical Provider, MD    Past Medical History: Past Medical History  Diagnosis Date  . Coronary artery disease     S/P PCI of LAD with DES (12/2008). Total occlusion of RCA noted at that time., medically managed. ACS ruled out 03/2009 with Lexiscan myoview . Followed by .  . Pulmonary hypertension     2-D Echo (52/8413) - Systolic pressure was moderately increased. PA peak pressure  43mHg. secondary pulm htn likely on basis of comb of interstital lung disease, severe  copd, small airways disease, severe sleep apnea and cor pulmonale,. Followed by Dr. Joya Gaskins Velora Heckler)  . Diastolic dysfunction     2-D Echo (12/2008) - Normal LV Systolic funciton with EF 60-65%. Grade 1 diastolid dysfunction. No regional wall motion abnormalities. Moderate pulmonary HTN with PA peak pressure 52mHg.  .Marland KitchenCOPD (chronic obstructive pulmonary disease)     Severe. Gold Stage IV.  PFTs (12/2008) - severe obstructive airway disease. Active tobacco use. Requires 4L O2 at home.  . Pulmonary nodule, right     Small right middle lobe nodule. Stable as of 12/2008.  .Marland KitchenSleep apnea     Presumptive. No  documented sleep studies.   . Prediabetes 12/2008    HgbA1c 6.4 (12/2008)  . Hx MRSA infection     Recurrent MRSA thigh abscesses.  . Tobacco abuse     Ongoing.  . Obesity   . Hyperlipidemia   . GERD (gastroesophageal reflux disease)     S/P Nissen fundoplication.  . Shortness of breath   . CHF (congestive heart failure)   . HMHWKGSUP(103.1     Past Surgical History: Past Surgical History  Procedure Laterality Date  . Total abdominal hysterectomy w/ bilateral salpingoophorectomy    . Nissen fundoplication      Family History: Family History  Problem Relation Age of Onset  . Heart disease Mother 433   Deceased from MI at 437yo . Hypertension Mother   . Heart disease Father 519   Deceased of MI age 58yo . Hypertension Father   . Hypertension Brother   . Lung cancer      Grandmother    Social History: History   Social History  . Marital Status: Single    Spouse Name: N/A    Number of Children: N/A  . Years of Education: N/A   Social History Main Topics  . Smoking status: Current Every Day Smoker -- 1.00 packs/day for 40 years    Types: Cigarettes  . Smokeless tobacco: Never Used  . Alcohol Use: No  . Drug Use: No  . Sexually Active: No   Other Topics Concern  . None   Social History Narrative   Formerly worked as a cScientist, water quality now disabled.   Divorced.   2 grown children.   Lives with her grandson.    Allergies:  Allergies  Allergen Reactions  . Fluconazole Anaphylaxis    Objective:    Vital Signs:   Temp:  [98 F (36.7 C)-98.8 F (37.1 C)] 98 F (36.7 C) (08/07 0538) Pulse Rate:  [65-94] 94 (08/07 0859) Resp:  [18-22] 18 (08/07 0859) BP: (92-142)/(47-111) 108/76 mmHg (08/07 0908) SpO2:  [89 %-95 %] 89 % (08/07 0859) Weight:  [238 lb (107.956 kg)] 238 lb (107.956 kg) (08/07 0538) Last BM Date: 11/07/12  Weight change: Filed Weights   11/06/12 2105 11/07/12 0453 11/08/12 0538  Weight: 247 lb 1.6 oz (112.084 kg) 243 lb 6.2 oz (110.4 kg)  238 lb (107.956 kg)    Intake/Output:   Intake/Output Summary (Last 24 hours) at 11/08/12 1123 Last data filed at 11/08/12 1100  Gross per 24 hour  Intake    923 ml  Output   6500 ml  Net  -5577 ml     Physical Exam: General:  Chronically ill appearing. No resp difficulty HEENT: normal Neck: supple. JVP to jaw . Carotids 2+ bilat; no bruits. No lymphadenopathy or thryomegaly appreciated. Cor: PMI nondisplaced. Regular rate & rhythm. No rubs, gallops or murmurs. Lungs: decreased  in the bases on 4 liters [place] Abdomen: obese soft, nontender, nondistended. No hepatosplenomegaly. No bruits or masses. Good bowel sounds. Extremities: no cyanosis, clubbing, rash, edema Neuro: alert & orientedx3, cranial nerves grossly intact. moves all 4 extremities w/o difficulty. Affect pleasant  Telemetry: SR 80s  Labs: Basic Metabolic Panel:  Recent Labs Lab 11/05/12 1553 11/05/12 2045 11/06/12 0218 11/07/12 0500 11/07/12 1734 11/08/12 0845  NA 142  --  140 141 138 140  K 3.6  --  3.3* 4.4 4.1 3.5  CL 95*  --  89* 90* 84* 83*  CO2 43*  --  >45* 45* >45* >45*  GLUCOSE 122*  --  240* 140* 229* 224*  BUN 10  --  13 26* 31* 30*  CREATININE 0.73  --  0.78 0.80 0.85 0.83  CALCIUM 9.1  --  8.9 9.3 9.4 9.8  MG  --  1.8  --  2.2 2.1 2.1  PHOS  --  2.5  --   --   --   --     Liver Function Tests:  Recent Labs Lab 11/07/12 1734  AST 12  ALT 14  ALKPHOS 116  BILITOT 0.2*  PROT 7.0  ALBUMIN 3.0*   No results found for this basename: LIPASE, AMYLASE,  in the last 168 hours  Recent Labs Lab 11/07/12 1644  AMMONIA 33    CBC:  Recent Labs Lab 11/05/12 1553 11/06/12 0218 11/07/12 0500  WBC 11.3* 8.7 18.5*  NEUTROABS 8.5*  --   --   HGB 13.7 14.1 13.6  HCT 43.6 43.5 44.1  MCV 97.8 96.9 95.9  PLT 198 211 248    Cardiac Enzymes:  Recent Labs Lab 11/05/12 1553 11/05/12 2045 11/06/12 0218 11/06/12 1040  TROPONINI <0.30 <0.30 <0.30 <0.30    BNP: BNP (last 3  results)  Recent Labs  02/05/12 1412 08/11/12 1732 11/05/12 1553  PROBNP 592.2* 275.0* 4859.0*    CBG:  Recent Labs Lab 11/07/12 1122 11/07/12 1559 11/07/12 2102 11/08/12 0620 11/08/12 1109  GLUCAP 193* 199* 152* 110* 187*    Coagulation Studies: No results found for this basename: LABPROT, INR,  in the last 72 hours  Other results: EKG:  Imaging: Dg Chest 2 View  11/06/2012   *RADIOLOGY REPORT*  Clinical Data: Dyspnea  CHEST - 2 VIEW  Comparison: 11/07/2010 609 hours  Findings: The cardiac shadow is again enlarged in size.  Patchy bilateral air elects cyst is noted.  There is also evidence of interstitial edema.  The overall appearance is slightly increased when compared the prior exam.  IMPRESSION: Increasing bilateral atelectasis as well as some pulmonary edema changes.   Original Report Authenticated By: Inez Catalina, M.D.      Medications:     Current Medications: . albuterol  5 mg Nebulization Q6H  . azithromycin  500 mg Oral Daily  . furosemide  60 mg Intravenous Q6H  . heparin  5,000 Units Subcutaneous Q8H  . insulin aspart  0-24 Units Subcutaneous TID AC & HS  . insulin glargine  5 Units Subcutaneous QHS  . ipratropium  0.5 mg Nebulization Q6H  . nicotine  14 mg Transdermal Daily  . potassium chloride  40 mEq Oral Once  . predniSONE  40 mg Oral Q breakfast  . sodium chloride  3 mL Intravenous Q12H     Infusions:      Assessment:   1. A/C respiratory failure - on chronic 4 liters [place] 2. Acute diastolic Heart Failure 3. Pulmonary Hypertensio -  Peak Pa pressure 64 3. COPD 4. CAD-DES LAD total RCA occlusion 12/2008  5.Obesity 6. Tobacco- current smoker 1/2 pack per day 7. Noncompliance   Plan/Discussion:    [name] is 57 year old admitted with increased dyspnea which is multifactorial given her noncompliance with medications, diastolic heart failure and COPD.   1. Diastolic Heart Failure Diuresing well with lasix 60 mg IV every 6 hours. Renal  function ok Change to low sodium diet and limit fluids to < 1500cc Consult HF dietitian.  2. Pulmonary HTN ECHO 11/07/12 Peak PA pressure 64 Plan RHC for tomorrow to sort out volume.  Continue oxygen 4 liters [place] Will need sleep study once discharged. Refer to pulmonary for follow up in the community  3. COPD- chronic 4l iters [place] Continue 4 liters [place]. Would benefit form pulmonary follow up for ongoing management and sleep study.   4. CAD- DES -LAD total occlusion RCL 2010 Last lipid panel 09/08/10 Check lipid panel in am  Start aspirin 81 mg daily and lipitor 20 mg dialy  5. Current smoker Needs to stop smoking. Continue nicotine patch  6. Noncompliance Consult SW to assist with transportation to follow up appointments. Refer to [facility]   Length of Stay: 3  CLEGG,AMY 11/08/2012, 11:23 AM  Advanced Heart Failure Team Pager [phone] (M-F; 7a - 4p)  Please contact [facility] for night-coverage after hours (4p -7a ) and weekends on [email or web address]  Patient seen with NP, agree with the above note.    Patient remains volume overloaded on exam with acute on chronic diastolic CHF.  Continue Lasix 60 mg IV every 8 hours.  RHC tomorrow to aid with determination of volume status and to assess PA pressure.    Pulmonary hypertension is likely a mixed picture from left heart failure, parenchymal lung disease (COPD) and suspected OSA.    Loralie Champagne 11/08/2012 2:13 PM

## 2012-11-09 ENCOUNTER — Encounter (HOSPITAL_COMMUNITY)
Admission: EM | Disposition: A | Discharge status: Home / Self Care | Payer: Self-pay | Source: Home / Self Care | Attending: Internal Medicine

## 2012-11-09 DIAGNOSIS — I509 Heart failure, unspecified: Secondary | ICD-10-CM

## 2012-11-09 HISTORY — PX: RIGHT HEART CATHETERIZATION: SHX5447

## 2012-11-09 LAB — POCT I-STAT 3, VENOUS BLOOD GAS (G3P V)
Bicarbonate: 53.5 mEq/L — ABNORMAL HIGH (ref 20.0–24.0)
TCO2: >50 mmol/L (ref 0–100)
pCO2, Ven: 87.8 mmHg (ref 45.0–50.0)
pH, Ven: 7.393 — ABNORMAL HIGH (ref 7.250–7.300)
pO2, Ven: 33 mmHg (ref 30.0–45.0)

## 2012-11-09 LAB — BASIC METABOLIC PANEL
BUN: 35 mg/dL — ABNORMAL HIGH (ref 6–23)
BUN: 37 mg/dL — ABNORMAL HIGH (ref 6–23)
CO2: >45 mEq/L (ref 19–32)
CO2: 41 mEq/L (ref 19–32)
CO2: 39 mEq/L — ABNORMAL HIGH (ref 19–32)
Calcium: 9.8 mg/dL (ref 8.4–10.5)
Calcium: 9.7 mg/dL (ref 8.4–10.5)
Chloride: 86 mEq/L — ABNORMAL LOW (ref 96–112)
Chloride: 84 mEq/L — ABNORMAL LOW (ref 96–112)
Creatinine, Ser: 0.85 mg/dL (ref 0.50–1.10)
Creatinine, Ser: 0.82 mg/dL (ref 0.50–1.10)
GFR calc non Af Amer: 75 mL/min — ABNORMAL LOW (ref >90)
Glucose, Bld: 235 mg/dL — ABNORMAL HIGH (ref 70–99)
Glucose, Bld: 238 mg/dL — ABNORMAL HIGH (ref 70–99)
Potassium: 4.2 mEq/L (ref 3.5–5.1)

## 2012-11-09 LAB — CBC
HCT: 47.8 % — ABNORMAL HIGH (ref 36.0–46.0)
Hemoglobin: 15.7 g/dL — ABNORMAL HIGH (ref 12.0–15.0)
MCV: 93.2 fL (ref 78.0–100.0)
RBC: 5.13 MIL/uL — ABNORMAL HIGH (ref 3.87–5.11)
RDW: 13.2 % (ref 11.5–15.5)
WBC: 15.3 10*3/uL — ABNORMAL HIGH (ref 4.0–10.5)

## 2012-11-09 LAB — GLUCOSE, CAPILLARY
Glucose-Capillary: 141 mg/dL — ABNORMAL HIGH (ref 70–99)
Glucose-Capillary: 174 mg/dL — ABNORMAL HIGH (ref 70–99)

## 2012-11-09 LAB — LIPID PANEL
HDL: 30 mg/dL — ABNORMAL LOW (ref >39)
LDL Cholesterol: 136 mg/dL — ABNORMAL HIGH (ref 0–99)
Total CHOL/HDL Ratio: 7.9 RATIO
Triglycerides: 356 mg/dL — ABNORMAL HIGH (ref <150)
VLDL: 71 mg/dL — ABNORMAL HIGH (ref 0–40)

## 2012-11-09 LAB — MAGNESIUM: Magnesium: 2.5 mg/dL (ref 1.5–2.5)

## 2012-11-09 LAB — PROTIME-INR: Prothrombin Time: 12.2 seconds (ref 11.6–15.2)

## 2012-11-09 SURGERY — RIGHT HEART CATH
Anesthesia: LOCAL

## 2012-11-09 MED ORDER — ACETAMINOPHEN 325 MG PO TABS: 650.0000 mg | ORAL_TABLET | ORAL | Status: DC | PRN

## 2012-11-09 MED ORDER — FUROSEMIDE 20 MG PO TABS: 20.0000 mg | ORAL_TABLET | Freq: Two times a day (BID) | ORAL | Status: DC

## 2012-11-09 MED ORDER — LIDOCAINE HCL (PF) 1 % IJ SOLN
INTRAMUSCULAR | Status: AC
  Filled 2012-11-09: qty 30

## 2012-11-09 MED ORDER — FUROSEMIDE 40 MG PO TABS
40.0000 mg | ORAL_TABLET | Freq: Two times a day (BID) | ORAL | Status: DC
  Administered 2012-11-09 (×2): 40 mg via ORAL
  Filled 2012-11-09 (×4): qty 1

## 2012-11-09 MED ORDER — LOVASTATIN 20 MG PO TABS: 20.0000 mg | ORAL_TABLET | Freq: Every day | ORAL | Status: DC

## 2012-11-09 MED ORDER — ALBUTEROL SULFATE HFA 108 (90 BASE) MCG/ACT IN AERS: 1.0000 | INHALATION_SPRAY | RESPIRATORY_TRACT | Status: DC | PRN

## 2012-11-09 MED ORDER — HEPARIN SODIUM (PORCINE) 5000 UNIT/ML IJ SOLN
5000.0000 [IU] | Freq: Three times a day (TID) | INTRAMUSCULAR | Status: DC
  Filled 2012-11-09 (×4): qty 1

## 2012-11-09 MED ORDER — MIDAZOLAM HCL 2 MG/2ML IJ SOLN
INTRAMUSCULAR | Status: AC
  Filled 2012-11-09: qty 2

## 2012-11-09 MED ORDER — SODIUM CHLORIDE 0.9 % IJ SOLN: 3.0000 mL | Freq: Two times a day (BID) | INTRAMUSCULAR | Status: DC

## 2012-11-09 MED ORDER — TIOTROPIUM BROMIDE MONOHYDRATE 18 MCG IN CAPS
18.0000 ug | ORAL_CAPSULE | Freq: Every day | RESPIRATORY_TRACT | Status: DC
  Filled 2012-11-09: qty 5

## 2012-11-09 MED ORDER — ONDANSETRON HCL 4 MG/2ML IJ SOLN: 4.0000 mg | Freq: Four times a day (QID) | INTRAMUSCULAR | Status: DC | PRN

## 2012-11-09 MED ORDER — FENTANYL CITRATE 0.05 MG/ML IJ SOLN
INTRAMUSCULAR | Status: AC
  Filled 2012-11-09: qty 2

## 2012-11-09 MED ORDER — SODIUM CHLORIDE 0.9 % IV SOLN: 250.0000 mL | INTRAVENOUS | Status: DC | PRN

## 2012-11-09 MED ORDER — SODIUM CHLORIDE 0.9 % IJ SOLN: 3.0000 mL | INTRAMUSCULAR | Status: DC | PRN

## 2012-11-09 MED ORDER — PREDNISONE 20 MG PO TABS: 40.0000 mg | ORAL_TABLET | Freq: Every day | ORAL | Status: DC

## 2012-11-09 MED ORDER — ASPIRIN 81 MG PO CHEW: 81.0000 mg | CHEWABLE_TABLET | Freq: Every day | ORAL | Status: DC

## 2012-11-09 MED ORDER — TIOTROPIUM BROMIDE MONOHYDRATE 18 MCG IN CAPS: 18.0000 ug | ORAL_CAPSULE | Freq: Every day | RESPIRATORY_TRACT | Status: DC

## 2012-11-09 MED ORDER — NICOTINE 14 MG/24HR TD PT24: 1.0000 | MEDICATED_PATCH | TRANSDERMAL | Status: DC

## 2012-11-09 MED ORDER — ALBUTEROL SULFATE HFA 108 (90 BASE) MCG/ACT IN AERS
1.0000 | INHALATION_SPRAY | RESPIRATORY_TRACT | Status: DC | PRN
  Administered 2012-11-09: 1 via RESPIRATORY_TRACT
  Filled 2012-11-09: qty 6.7

## 2012-11-09 MED ORDER — MOMETASONE FURO-FORMOTEROL FUM 200-5 MCG/ACT IN AERO
2.0000 | INHALATION_SPRAY | Freq: Two times a day (BID) | RESPIRATORY_TRACT | Status: DC
  Administered 2012-11-09: 2 via RESPIRATORY_TRACT
  Filled 2012-11-09: qty 8.8

## 2012-11-09 MED ORDER — HEPARIN (PORCINE) IN NACL 2-0.9 UNIT/ML-% IJ SOLN
INTRAMUSCULAR | Status: AC
  Filled 2012-11-09: qty 500

## 2012-11-09 MED ORDER — MOMETASONE FURO-FORMOTEROL FUM 200-5 MCG/ACT IN AERO: 2.0000 | INHALATION_SPRAY | Freq: Two times a day (BID) | RESPIRATORY_TRACT | Status: DC

## 2012-11-09 MED ORDER — LORAZEPAM 2 MG/ML IJ SOLN: 1.0000 mg | Freq: Once | INTRAMUSCULAR | Status: DC

## 2012-11-09 NOTE — Progress Notes (Signed)
Pt post Rt AC. Cath site. With dsg D&I.  Spoke with Dr. Aundra Dubin asked if is ok for pt to get OOB now as no order seen indicating that.  Dr.  Aundra Dubin instructed it is ok to get pt OOB.  Pt informed and instructed to call when she is ready to do so.  Verbalized understanding.  Boss Danielsen,RN.

## 2012-11-09 NOTE — Progress Notes (Signed)
  Date: 11/09/2012  Patient name: [name]  Medical record number: [id number]  Date of birth: [birth year]   This patient has been seen and the plan of care was discussed with the house staff. Please see their note for complete details. I concur with their findings with the following additions/corrections:  Improved today. She is s/p RHC.  She has mild-mod PAH and now normal PCWP.  She will need aggressive treatment of COPD and continued O2 dependence as well as evaluation for OSA.  She can be changed to PO lasix, start with 40 mg PO bid for 7 days then 20 mg PO bid. She must be seen in one week in our clinic, I emphasized to her how important this is. She is at risk of being readmitted if she does not adhere to treatment and does not follow up. She understands.  She is eager to go home. Her COPD regimen needs to be discussed with CM to assure she can afford these medications. Prednisone 40 mg PO daily for 7 days total, including treatment here. She is medically stable for D/C if ok with cardiology. Discussed tobacco cessation, she understands.   Dominic Pea, DO 11/09/2012, 2:22 PM

## 2012-11-09 NOTE — Progress Notes (Signed)
Pt assessment completed, pt very anxious to go home today. Pt dc home per MD order, dc instruction and prescription given by Allayne Gitelman charge nurse. Pt taken down to the lobby on WC by Alinda Sierras NT, family member at her side. Pt's own portable O2 present at dc.

## 2012-11-09 NOTE — Progress Notes (Signed)
Thank you to teaching service for this referral.  We are not in network with the [facility] insurance.  For this reason the patient is not eligible for services.  Of note, [facility] Care Management services does not replace or interfere with any services that are arranged by inpatient case management or social work.  For additional questions or referrals please contact Corliss Blacker BSN RN [facility] Liaison at [phone].

## 2012-11-09 NOTE — Progress Notes (Signed)
Pt's Co2 41 paged on call MD at 1220 no response.  Able to get Dr. Naaman Plummer on the phone at this time and informed her of pt's Co2.  No new orders given.   Will continue to monitor.

## 2012-11-09 NOTE — Progress Notes (Signed)
Subjective:  Pt seen and examined. No complaints.  Wants to go home.    Objective: Vital signs in last 24 hours: Filed Vitals:   11/08/12 2028 11/08/12 2127 11/09/12 0223 11/09/12 0638  BP:  120/73  150/83  Pulse:  84  71  Temp:  98.5 F (36.9 C)  97.8 F (36.6 C)  TempSrc:  Oral  Oral  Resp:  20  20  Height:      Weight:    107 kg (235 lb 14.3 oz)  SpO2: 92% 92% 93% 92%   Weight change: -0.956 kg (-2 lb 1.7 oz)  Intake/Output Summary (Last 24 hours) at 11/09/12 0716 Last data filed at 11/09/12 0700  Gross per 24 hour  Intake    706 ml  Output   3275 ml  Net  -2569 ml   Physical Exam: Constitutional: No distress.  HENT: Normocephalic  Neck: Normal range of motion. Neck supple.  Cardiovascular: Normal rate, regular rhythm, normal heart sounds and intact distal pulses.  1+ pitting edema to shins bilaterally  Pulmonary/Chest: No accessory muscle usage. No respiratory distress. She has rales in the rightt lower field and the left lower field.  Abdominal: Soft. Bowel sounds are normal.  Musculoskeletal: Normal range of motion.  Neurological: She is alert and oriented to person, place, and time. .      Lab Results: Basic Metabolic Panel:  Recent Labs Lab 11/05/12 1553 11/05/12 2045  11/08/12 1215 11/08/12 1806 11/09/12 0447  NA 142  --   < > 138  --  138  K 3.6  --   < > 3.9  --  4.2  CL 95*  --   < > 83*  --  86*  CO2 43*  --   < > >45*  --  >45*  GLUCOSE 122*  --   < > 177*  --  142*  BUN 10  --   < > 31*  --  36*  CREATININE 0.73  --   < > 0.84  --  0.85  CALCIUM 9.1  --   < > 9.6  --  9.9  MG  --  1.8  < > 2.1 2.2 2.5  PHOS  --  2.5  --   --   --   --   < > = values in this interval not displayed. Liver Function Tests:  Recent Labs Lab 11/07/12 1734  AST 12  ALT 14  ALKPHOS 116  BILITOT 0.2*  PROT 7.0  ALBUMIN 3.0*   No results found for this basename: LIPASE, AMYLASE,  in the last 168 hours  Recent Labs Lab 11/07/12 1644  AMMONIA 33     CBC:  Recent Labs Lab 11/05/12 1553  11/07/12 0500 11/09/12 0447  WBC 11.3*  < > 18.5* 15.3*  NEUTROABS 8.5*  --   --   --   HGB 13.7  < > 13.6 15.7*  HCT 43.6  < > 44.1 47.8*  MCV 97.8  < > 95.9 93.2  PLT 198  < > 248 232  < > = values in this interval not displayed. Cardiac Enzymes:  Recent Labs Lab 11/05/12 2045 11/06/12 0218 11/06/12 1040  TROPONINI <0.30 <0.30 <0.30   BNP:  Recent Labs Lab 11/05/12 1553  PROBNP 4859.0*   D-Dimer: No results found for this basename: DDIMER,  in the last 168 hours CBG:  Recent Labs Lab 11/07/12 2102 11/08/12 0620 11/08/12 1109 11/08/12 1556 11/08/12 2110 11/09/12 0624  GLUCAP  152* 110* 187* 224* 161* 139*   Hemoglobin A1C:  Recent Labs Lab 11/06/12 1411  HGBA1C 5.8*   Fasting Lipid Panel:  Recent Labs Lab 11/09/12 0447  CHOL 237*  HDL 30*  LDLCALC 136*  TRIG 356*  CHOLHDL 7.9   Thyroid Function Tests:  Recent Labs Lab 11/05/12 2045  TSH 0.708   Coagulation:  Recent Labs Lab 11/09/12 0447  LABPROT 12.2  INR 0.92   Anemia Panel: No results found for this basename: VITAMINB12, FOLATE, FERRITIN, TIBC, IRON, RETICCTPCT,  in the last 168 hours Urine Drug Screen: Drugs of Abuse     Component Value Date/Time   LABOPIA NEGATIVE 02/04/2009 [place] 02/04/2009 [place] 02/04/2009 2315   AMPHETMU NEGATIVE 02/04/2009 2315    Alcohol Level: No results found for this basename: ETH,  in the last 168 hours Urinalysis:  Recent Labs Lab 11/05/12 2115 11/07/12 1509  COLORURINE YELLOW YELLOW  LABSPEC 1.007 1.014  PHURINE 5.0 6.0  GLUCOSEU NEGATIVE NEGATIVE  HGBUR NEGATIVE NEGATIVE  BILIRUBINUR NEGATIVE NEGATIVE  KETONESUR NEGATIVE NEGATIVE  PROTEINUR NEGATIVE NEGATIVE  UROBILINOGEN 0.2 1.0  NITRITE NEGATIVE NEGATIVE  LEUKOCYTESUR NEGATIVE NEGATIVE    Micro Results: Recent Results (from the past 240 hour(s))  MRSA PCR SCREENING     Status: None   Collection  Time    11/05/12  8:05 PM      Result Value Range Status   MRSA by PCR NEGATIVE  NEGATIVE Final   Comment:            The GeneXpert MRSA Assay (FDA     approved for NASAL specimens     only), is one component of a     comprehensive MRSA colonization     surveillance program. It is not     intended to diagnose MRSA     infection nor to guide or     monitor treatment for     MRSA infections.  CULTURE, BLOOD (ROUTINE X 2)     Status: None   Collection Time    11/05/12  8:20 PM      Result Value Range Status   Specimen Description BLOOD RIGHT ARM   Final   Special Requests BOTTLES DRAWN AEROBIC AND ANAEROBIC 10CC EA   Final   Culture  Setup Time     Final   Value: 11/06/2012 05:55     Performed at [facility]   Culture     Final   Value:        BLOOD CULTURE RECEIVED NO GROWTH TO DATE CULTURE WILL BE HELD FOR 5 DAYS BEFORE ISSUING A FINAL NEGATIVE REPORT     Performed at [facility]   Report Status PENDING   Incomplete  CULTURE, BLOOD (ROUTINE X 2)     Status: None   Collection Time    11/05/12  8:35 PM      Result Value Range Status   Specimen Description BLOOD RIGHT HAND   Final   Special Requests BOTTLES DRAWN AEROBIC AND ANAEROBIC 10CC EA   Final   Culture  Setup Time     Final   Value: 11/06/2012 05:55     Performed at [facility]   Culture     Final   Value:        BLOOD CULTURE RECEIVED NO GROWTH TO DATE CULTURE WILL BE HELD FOR 5 DAYS BEFORE ISSUING A FINAL NEGATIVE REPORT     Performed at  [facility]   Report Status PENDING   Incomplete   Studies/Results: No results found. Medications: I have reviewed the patient's current medications. Scheduled Meds: . albuterol  5 mg Nebulization Q6H  . aspirin  81 mg Oral Daily  . atorvastatin  20 mg Oral q1800  . azithromycin  500 mg Oral Daily  . furosemide  60 mg Intravenous TID AC  . heparin  5,000 Units Subcutaneous Q8H  . insulin aspart  0-24 Units Subcutaneous TID AC & HS  .  insulin glargine  5 Units Subcutaneous QHS  . ipratropium  0.5 mg Nebulization Q6H  . nicotine  14 mg Transdermal Daily  . predniSONE  40 mg Oral Q breakfast  . sodium chloride  3 mL Intravenous Q12H  . sodium chloride  3 mL Intravenous Q12H   Continuous Infusions: . sodium chloride 20 mL (11/09/12 0552)   PRN Meds:.sodium chloride, sodium chloride, acetaminophen, sodium chloride, sodium chloride Assessment/Plan: Principal Problem:   Acute respiratory failure with hypoxia Active Problems:   OBESITY   TOBACCO ABUSE   CORONARY ARTERY DISEASE   PULMONARY HYPERTENSION, SECONDARY   CHRONIC OBSTRUCTIVE PULMONARY DISEASE   GASTROESOPHAGEAL REFLUX DISEASE   SLEEP APNEA   COPD (chronic obstructive pulmonary disease)   Hyperlipidemia   Coronary artery disease   Diastolic dysfunction   CHF (congestive heart failure)   Hypercapnic respiratory failure, chronic   Metabolic alkalosis   CAP (community acquired pneumonia)   Acute on chronic diastolic heart failure   COPD exacerbation   Asterixis   Pre-diabetes  Pt is a 57 yo Caucasian female with multiple pmh significant for obesity, HL, GERD s/p fundoplication, CAD s/p DES (2010), pulmonary HTN, 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  # COPD exacerbation with chronic hypercapnia - O2 saturation 85 on 4LNC at presentation. Increase in SOB and cough x2 days. ABG shows a chronic hypercapnia with superimposed metabolic alkalosis.  - No BiPap given chronicity of her hypercapnia  - Per cardiology 4L O2 via [place] with continuous pulse ox, goal O2 sat 88-92%  - PT with 4L of oxygen and record O2 sats  - Oral Prednisone 40 mg daily Day 5/7 - Duonebs q6h and PRN  - Ambulate order  -Per cardiology, needs pulmonology f/u and sleep  study   # Acute on chronic CHF - BMP 4800. Lower extremity edema on exam. Denies orthopnea, PND.  8/5 CXR  increasing b/l atelectasis and pulmonary edema changes  - Continue IV Lasix 82m Q6H   - Check BMP and Mg levels x3 daily  - 2D- Echo with EF 55-60%, Pulmonary HTN 651mg --->RHC in AM  - Strict I &O's - 3.3L out in last day. 8.1L since admission - Obtain daily weight - 3lb wt loss in last day, 11lb since admission - Adhere to salt restricted diet and fluid restriction <1500cc   # community acquired pneumonia - on admission WBC 11.3, multiple focal opacities on CXR, rales on physical exam.  - PO Azithromycin Day 5 /5, s/p IV ceftriaxone and axithromycin - Awaiting blood cultures  - Legionella antigen, strep pneumo urinary antigen negative   - Incentive spirometry every 2 hours while awake    # Hx of CAD - New inferior lateral T wave inversions as compared to prior EKG. No active chest  pain or anginal symptoms. Troponins neg x 3 - Continue ASA 92m  - Continue Atorvastatin 241m- Obtain lipid panel -  Chol 237, Trig 356, LDL 136, HDL 30 - [place] Coronary Heart Disease Risk of 20.8% risk of MI or death in next 10 years  #hyperglycemia - due to pre-diabetes HbA1c 5.8 and steroids -Continue ISS  -Continue Lantus 5U before bed   # tobacco dependence  - nicotine patch 1461m-smoking cessation counseling   #DVT PPX - Heparin subq  Dispo: Disposition is deferred at this time, awaiting improvement of current medical problems.  Anticipated discharge in approximately ? day(s).   The patient does have a current PCP (Provider Default, MD) and [facility].  The patient does have transportation limitations that hinder transportation to clinic appointments.  .Services Needed at time of discharge: Y = Yes, Blank = No PT: yes  OT:   RN:   Equipment:   Other:     LOS: 4 days   MarJuluis MireD 11/09/2012, 7:16 AM

## 2012-11-09 NOTE — Progress Notes (Signed)
Pt refused to continue running NS ordered as was disconnected earlier to ambulate with pt and with plan to d/c today.  Zoe Goonan ABuck, RN.

## 2012-11-09 NOTE — Progress Notes (Signed)
Discharge instructions, home medications and heart failure education reviewed with patient.  Patient verbalized understanding and denied having any questions. Patient d/c to home with room mate on her own home oxygen. Tresa Endo

## 2012-11-09 NOTE — Progress Notes (Signed)
Patient ID: JAHARI WIGINTON, female   DOB: 07-13-1955, 57 y.o.   MRN: [id number]    SUBJECTIVE: Patient diuresed reasonably well yesterday.  She is breathing better.  Still on 6L O2 (4L at home).   Marland Kitchen albuterol  5 mg Nebulization Q6H  . aspirin  81 mg Oral Daily  . atorvastatin  20 mg Oral q1800  . azithromycin  500 mg Oral Daily  . [COMPLETED] fentaNYL      . furosemide  60 mg Intravenous TID AC  . heparin  5,000 Units Subcutaneous Q8H  . insulin aspart  0-24 Units Subcutaneous TID AC & HS  . insulin glargine  5 Units Subcutaneous QHS  . ipratropium  0.5 mg Nebulization Q6H  . [COMPLETED] midazolam      . nicotine  14 mg Transdermal Daily  . predniSONE  40 mg Oral Q breakfast  . sodium chloride  3 mL Intravenous Q12H  . sodium chloride  3 mL Intravenous Q12H      Filed Vitals:   11/08/12 2127 11/09/12 0223 11/09/12 0638 11/09/12 0822  BP: 120/73  150/83 131/79  Pulse: 84  71 86  Temp: 98.5 F (36.9 C)  97.8 F (36.6 C) 98.1 F (36.7 C)  TempSrc: Oral  Oral Oral  Resp: _0 Height:      Weight:   107 kg (235 lb 14.3 oz)   SpO2: 92% 93% 92% 95%    Intake/Output Summary (Last 24 hours) at 11/09/12 0909 Last data filed at 11/09/12 0800  Gross per 24 hour  Intake    466 ml  Output   3375 ml  Net  -2909 ml    LABS: Basic Metabolic Panel:  Recent Labs  11/08/12 1215 11/08/12 1806 11/09/12 0447  NA 138  --  138  K 3.9  --  4.2  CL 83*  --  86*  CO2 >45*  --  >45*  GLUCOSE 177*  --  142*  BUN 31*  --  36*  CREATININE 0.84  --  0.85  CALCIUM 9.6  --  9.9  MG 2.1 2.2 2.5   Liver Function Tests:  Recent Labs  11/07/12 1734  AST 12  ALT 14  ALKPHOS 116  BILITOT 0.2*  PROT 7.0  ALBUMIN 3.0*   No results found for this basename: LIPASE, AMYLASE,  in the last 72 hours CBC:  Recent Labs  11/07/12 0500 11/09/12 0447  WBC 18.5* 15.3*  HGB 13.6 15.7*  HCT 44.1 47.8*  MCV 95.9 93.2  PLT 248 232   Cardiac Enzymes:  Recent Labs  11/06/12 1040    TROPONINI <0.30   BNP: No components found with this basename: POCBNP,  D-Dimer: No results found for this basename: DDIMER,  in the last 72 hours Hemoglobin A1C:  Recent Labs  11/06/12 1411  HGBA1C 5.8*   Fasting Lipid Panel:  Recent Labs  11/09/12 0447  CHOL 237*  HDL 30*  LDLCALC 136*  TRIG 356*  CHOLHDL 7.9   Thyroid Function Tests: No results found for this basename: TSH, T4TOTAL, FREET3, T3FREE, THYROIDAB,  in the last 72 hours Anemia Panel: No results found for this basename: VITAMINB12, FOLATE, FERRITIN, TIBC, IRON, RETICCTPCT,  in the last 72 hours  RADIOLOGY: Dg Chest 2 View  11/06/2012   *RADIOLOGY REPORT*  Clinical Data: Dyspnea  CHEST - 2 VIEW  Comparison: 11/07/2010 609 hours  Findings: The cardiac shadow is again enlarged in size.  Patchy bilateral air elects cyst  is noted.  There is also evidence of interstitial edema.  The overall appearance is slightly increased when compared the prior exam.  IMPRESSION: Increasing bilateral atelectasis as well as some pulmonary edema changes.   Original Report Authenticated By: Inez Catalina, M.D.   Dg Chest Port 1 View  11/06/2012   *RADIOLOGY REPORT*  Clinical Data: Pneumonia.  PORTABLE CHEST - 1 VIEW  Comparison: 11/05/2012  Findings: Cardiomegaly with vascular congestion.  Diffuse bilateral airspace opacities throughout the lungs.  This presumably represents edema.  No visible significant effusion.  No acute bony abnormality. Improvement in the focal opacity in the right upper lobe.  IMPRESSION: Cardiomegaly, vascular congestion and bilateral airspace opacities, most likely edema.   Original Report Authenticated By: Rolm Baptise, M.D.   Dg Chest Portable 1 View  11/05/2012   *RADIOLOGY REPORT*  Clinical Data: Respiratory distress, shortness of breath.  PORTABLE CHEST - 1 VIEW  Comparison: 08/11/2012  Findings: Cardiomegaly.  Vascular congestion.  Bilateral lower lobe atelectasis or infiltrates.  Airspace opacity in the right  upper lobe peripherally.  Small bilateral pleural effusions.  Diffuse interstitial prominence could reflect interstitial edema.  IMPRESSION: Cardiomegaly with vascular congestion and possible interstitial edema.  More focal opacities in the lung bases and inferior right upper lobe.  This could represent atelectasis or infiltrates.  Small effusions.   Original Report Authenticated By: Rolm Baptise, M.D.    PHYSICAL EXAM General: NAD Neck: JVP difficult, ? 8-9 cm, no thyromegaly or thyroid nodule.  Lungs: Distant breath sounds bilaterally.  CV: Nondisplaced PMI.  Heart regular S1/S2, no S3/S4, no murmur.  No peripheral edema.  No carotid bruit.   Abdomen: Soft, nontender, no hepatosplenomegaly, no distention.  Neurologic: Alert and oriented x 3.  Psych: Normal affect. Extremities: No clubbing or cyanosis.   TELEMETRY: Reviewed telemetry pt in NSR  Assessment:   1. A/C respiratory failure - on chronic 4 liters [place]  2. Acute diastolic Heart Failure  3. Pulmonary Hypertension - Peak Pa pressure 64 by echo 3. COPD on 4L home oxygen.  4. CAD-DES LAD total RCA occlusion 12/2008  5.Obesity  6. Tobacco- current smoker 1/2 pack per day  7. Noncompliance   Plan/Discussion:   Ms Macaluso is 57 year old admitted with increased dyspnea which is multifactorial given her noncompliance with medications, diastolic heart failure and COPD.   1. Acute Diastolic Heart Failure  - Diuresing well with lasix 60 mg IV every 6 hours. Renal function ok  - Difficult exam for volume, RHC today.  2. Pulmonary HTN: ECHO 11/07/12 Peak PA pressure 64.  Suspect multifactorial: COPD, possible OSA, elevated LA pressure from diastolic dysfunction.  - RHC today - Continue oxygen 4 liters [place]  - Will need sleep study once discharged. Refer to pulmonary for follow up in the community  3. COPD- chronic 4 liters [place]  Continue 4 liters [place]. Would benefit form pulmonary follow up for ongoing management and sleep study.  4. CAD- DES -LAD  total occlusion RCL 2010  - Continue statin and ASA.  5. Current smoker  Needs to stop smoking. Continue nicotine patch  6. Noncompliance  Consult SW to assist with transportation to follow up appointments. Refer to [place] 11/09/2012 9:13 AM

## 2012-11-09 NOTE — Progress Notes (Signed)
Back at bedside. Explained [place] [facility] services. Consents signed. Clearly has issues with transportation. Will explore options with [place] [facility] community staff to see how can assist. Will follow up with patient post discharge. Confirmed best contact number.  Marthenia Rolling, MSN-Ed, RN, BSN- [facility] [phone]

## 2012-11-09 NOTE — Progress Notes (Signed)
Physical Therapy Evaluation Patient Details Name: [name] MRN: [id number] DOB: 24-Sep-1955 Today's Date: 11/09/2012 Time: 5956-3875 PT Time Calculation (min): 34 min  PT Assessment / Plan / Recommendation History of Present Illness  Pt presented with worsening SOB. Had been off meds for some time.   Clinical Impression  Pt is mobilizing at mod I level, feeling better than when she came into hospital. Encouraged her to get home pulse ox for biofeedback and monitoring her baseline levels. Encouraged her in HEP and healthy lifestyle. No acute or f/u PT needs at this time. PT signing off.    PT Assessment  Patent does not need any further PT services    Follow Up Recommendations  No PT follow up    Does the patient have the potential to tolerate intense rehabilitation      Barriers to Discharge        Equipment Recommendations  None recommended by PT    Recommendations for Other Services     Frequency      Precautions / Restrictions Precautions Precautions: Other (comment) (O2 dependent) Precaution Comments: O2 24/7 4L at home Restrictions Weight Bearing Restrictions: No   Pertinent Vitals/Pain O2 sats on 4L before ambulation 90% After ambulation 90%     Mobility  Bed Mobility Bed Mobility: Supine to Sit;Sitting - Scoot to Edge of Bed Supine to Sit: 7: Independent Sitting - Scoot to Edge of Bed: 7: Independent Transfers Transfers: Sit to Stand;Stand to Sit Sit to Stand: 7: Independent;From bed;From toilet Stand to Sit: 7: Independent;To chair/3-in-1 Ambulation/Gait Ambulation/Gait Assistance: 6: Modified independent (Device/Increase time) Ambulation Distance (Feet): 300 Feet Assistive device: None Ambulation/Gait Assistance Details: pt ambulates without AD with decreased gait speed but otherwise WFL Gait Pattern: Within Functional Limits Stairs: No Wheelchair Mobility Wheelchair Mobility: No    Exercises Other Exercises Other Exercises: discussed HEP and pt  verbalized understanding, admits that she needs to work on being more active, eating healthier, quitting smoking etc. Encouraged in these areas   PT Diagnosis:    PT Problem List:   PT Treatment Interventions:       PT Goals(Current goals can be found in the care plan section) Acute Rehab PT Goals Patient Stated Goal: return home PT Goal Formulation: No goals set, d/c therapy  Visit Information  Last PT Received On: 11/09/12 Assistance Needed: +1 History of Present Illness: Pt presented with worsening SOB. Had been off meds for some time.        Prior Functioning  Home Living Family/patient expects to be discharged to:: Private residence Living Arrangements: Other relatives;Non-relatives/Friends Available Help at Discharge: Family;Available 24 hours/day Type of Home: House Home Access: Stairs to enter [employer] of Steps: 1 Entrance Stairs-Rails: None Home Layout: One level (but with sunken rooms with 1 step) Home Equipment: Tub bench;Wheelchair - [employer] - 2 wheels;Cane - single point Prior Function Level of Independence: Independent Communication Communication: No difficulties    Cognition  Cognition Arousal/Alertness: Awake/alert Behavior During Therapy: WFL for tasks assessed/performed Overall Cognitive Status: Within Functional Limits for tasks assessed    Extremity/Trunk Assessment Upper Extremity Assessment Upper Extremity Assessment: Overall WFL for tasks assessed Lower Extremity Assessment Lower Extremity Assessment: Overall WFL for tasks assessed Cervical / Trunk Assessment Cervical / Trunk Assessment: Normal   Balance Balance Balance Assessed: Yes Dynamic Standing Balance Dynamic Standing - Balance Support: No upper extremity supported;During functional activity Dynamic Standing - Level of Assistance: 6: Modified independent (Device/Increase time)  End of Session PT - End of  Session Equipment Utilized During Treatment: Gait  belt;Oxygen Activity Tolerance: Patient tolerated treatment well Patient left: in chair;with call bell/phone within reach Nurse Communication: Mobility status  GP   Leighton Roach, [place]  [place], [place] 11/09/2012, 3:54 PM

## 2012-11-09 NOTE — Plan of Care (Signed)
Problem: Phase II Progression Outcomes Goal: O2 sats > equal to 90% on RA or at baseline Outcome: Completed/Met Date Met:  11/09/12 D/c on home oxygen  Problem: Discharge Progression Outcomes Goal: O2 sats > or equal 90% or at baseline Outcome: Completed/Met Date Met:  11/09/12 D/c home on oxygen

## 2012-11-09 NOTE — Progress Notes (Signed)
Came to bedside to speak with patient about [place] Management services. However, she was off the unit for cardiac cath. Left information at bedside. Will come back at later time. If unable to meet at bedside, will call patient post discharge and assess needs for [facility] Care Management services.  Marthenia Rolling, MSN- Ed, [facility] [phone]

## 2012-11-09 NOTE — CV Procedure (Signed)
   Cardiac Catheterization Procedure Note  Name: MYRICAL ANDUJO MRN: [id number] DOB: 1955-08-23  Procedure: Right Heart Cath  Indication: CHF, pulmonary hypertension.    Procedural Details: A peripheral IV was placed in the right brachial vein prior to procedure.  This was exchanged out for a 68F sheath. A Swan-Ganz catheter was used for the right heart catheterization. Standard protocol was followed for recording of right heart pressures and sampling of oxygen saturations. Fick cardiac output was calculated. There were no immediate procedural complications. The patient was transferred to the post catheterization recovery area for further monitoring.  Procedural Findings: Hemodynamics (mmHg) RA mean 9 RV 46/7 PA 49/27, mean 36 PCWP mean 11  Oxygen saturations: PA 58% AO 89%  Cardiac Output (Fick) 4.26  Cardiac Index (Fick) 2.01 PVR 5.9 WU  Final Conclusions:  Mild to moderate pulmonary arterial hypertension, normal PCWP.  I suspect that the pulmonary arterial HTN is due to a combination of COPD with hypoxemia and possible OSA.  She will need sleep study as outpatient and will need to continue home oxygen.  Given normal PCWP, can transition to po Lasix today.   [name] 11/09/2012, 9:41 AM

## 2012-11-09 NOTE — Progress Notes (Signed)
Foley d/c'd and pt tolerated well.  Urine clear and yellow.  Karie Kirks, Therapist, sports.

## 2012-11-11 NOTE — Discharge Summary (Signed)
Name: [name] MRN: [id number] DOB: 1955/04/15 57 y.o. PCP: Provider Default, MD  Date of Admission: 11/05/2012  3:25 PM Date of Discharge: 11/11/2012 Attending Physician: Dominic Pea, DO  Discharge Diagnosis: 1. Principal Problem:   Acute respiratory failure with hypoxia Active Problems:   OBESITY   TOBACCO ABUSE   CORONARY ARTERY DISEASE   PULMONARY HYPERTENSION, SECONDARY   CHRONIC OBSTRUCTIVE PULMONARY DISEASE   GASTROESOPHAGEAL REFLUX DISEASE   SLEEP APNEA   COPD (chronic obstructive pulmonary disease)   Hyperlipidemia   Coronary artery disease   Diastolic dysfunction   CHF (congestive heart failure)   Hypercapnic respiratory failure, chronic   Metabolic alkalosis   CAP (community acquired pneumonia)   Acute on chronic diastolic heart failure   COPD exacerbation   Asterixis   Pre-diabetes  Discharge Medications:   Medication List    STOP taking these medications       BC HEADACHE POWDER PO      TAKE these medications       albuterol 108 (90 BASE) MCG/ACT inhaler  Commonly known as:  PROVENTIL HFA;VENTOLIN HFA  Inhale 1 puff into the lungs every 4 (four) hours as needed for wheezing or shortness of breath.     aspirin 81 MG chewable tablet  Chew 1 tablet (81 mg total) by mouth daily.     furosemide 20 MG tablet  Commonly known as:  LASIX  Take 1 tablet (20 mg total) by mouth 2 (two) times daily.     lovastatin 20 MG tablet  Commonly known as:  MEVACOR  Take 1 tablet (20 mg total) by mouth at bedtime.     mometasone-formoterol 200-5 MCG/ACT Aero  Commonly known as:  DULERA  Inhale 2 puffs into the lungs 2 (two) times daily.     nicotine 14 mg/24hr patch  Commonly known as:  NICODERM CQ - dosed in mg/24 hours  Place 1 patch onto the skin daily.     predniSONE 20 MG tablet  Commonly known as:  DELTASONE  Take 2 tablets (40 mg total) by mouth daily with breakfast.     tiotropium 18 MCG inhalation capsule  Commonly known as:  SPIRIVA    Place 1 capsule (18 mcg total) into inhaler and inhale daily.        Disposition and follow-up:   Ms.[name] was discharged from [facility] in Stable condition.  At the hospital follow up visit please address:  1.  Compliance with medications, diet, monitoring of weight      Pulmonology follow-up for evaluation of OSA - sleep study      Pre-diabetes - continue to monitor       Recheck lipid panel and LFT's in 8 weeks - on statin therapy           2.  Labs / imaging needed at time of follow-up:  BMP   3.  Pending labs/ test needing follow-up: none  Follow-up Appointments:     Follow-up Information   Follow up with [facility]. Schedule an appointment as soon as possible for a visit in 1 week. (we will call you for follow up appt. )    Contact information:   [address] [place] [address] [phone]      Discharge Instructions:   Consultations: Treatment Team:  Larey Dresser, MD Neurology - Kathrynn Speed, MD  Procedures Performed:  Dg Chest 2 View  11/06/2012   *RADIOLOGY REPORT*  Clinical  Data: Dyspnea  CHEST - 2 VIEW  Comparison: 11/07/2010 609 hours  Findings: The cardiac shadow is again enlarged in size.  Patchy bilateral air elects cyst is noted.  There is also evidence of interstitial edema.  The overall appearance is slightly increased when compared the prior exam.  IMPRESSION: Increasing bilateral atelectasis as well as some pulmonary edema changes.   Original Report Authenticated By: Inez Catalina, M.D.   Dg Chest Port 1 View  11/06/2012   *RADIOLOGY REPORT*  Clinical Data: Pneumonia.  PORTABLE CHEST - 1 VIEW  Comparison: 11/05/2012  Findings: Cardiomegaly with vascular congestion.  Diffuse bilateral airspace opacities throughout the lungs.  This presumably represents edema.  No visible significant effusion.  No acute bony abnormality. Improvement in the focal opacity in the right upper lobe.  IMPRESSION:  Cardiomegaly, vascular congestion and bilateral airspace opacities, most likely edema.   Original Report Authenticated By: Rolm Baptise, M.D.   Dg Chest Portable 1 View  11/05/2012   *RADIOLOGY REPORT*  Clinical Data: Respiratory distress, shortness of breath.  PORTABLE CHEST - 1 VIEW  Comparison: 08/11/2012  Findings: Cardiomegaly.  Vascular congestion.  Bilateral lower lobe atelectasis or infiltrates.  Airspace opacity in the right upper lobe peripherally.  Small bilateral pleural effusions.  Diffuse interstitial prominence could reflect interstitial edema.  IMPRESSION: Cardiomegaly with vascular congestion and possible interstitial edema.  More focal opacities in the lung bases and inferior right upper lobe.  This could represent atelectasis or infiltrates.  Small effusions.   Original Report Authenticated By: Rolm Baptise, M.D.    2D Echo: 11/07/12 ------------------------------------------------------------ Study Conclusions  - Left ventricle: Systolic function was normal. The estimated ejection fraction was in the range of 55% to 60%. Regional wall motion abnormalities cannot be excluded. There was an increased relative contribution of atrial contraction to ventricular filling. - Ventricular septum: Septal motion showed paradox. - Left atrium: The atrium was mildly dilated. - Right atrium: The atrium was mildly dilated. - Pulmonary arteries: Systolic pressure was moderately increased. PA peak pressure: 67m Hg (S). Transthoracic echocardiography. M-mode, complete 2D, spectral Doppler, and color Doppler. Height: Height: 165.1cm. Height: 65in. Weight: Weight: 110.2kg. Weight: 242.5lb. Body mass index: BMI: 40.4kg/m^2. Body surface area: BSA: 2.140m. Patient status: Inpatient.  ------------------------------------------------------------  ------------------------------------------------------------ Left ventricle: Systolic function was normal. The estimated ejection fraction was in the  range of 55% to 60%. Regional wall motion abnormalities cannot be excluded. There was an increased relative contribution of atrial contraction to ventricular filling.  ------------------------------------------------------------ Aorta: Aortic root: The aortic root was not dilated.  ------------------------------------------------------------ Mitral valve: Doppler: Trivial regurgitation. Peak gradient: 36m25mg (D).  ------------------------------------------------------------ Left atrium: The atrium was mildly dilated.  ------------------------------------------------------------ Atrial septum: Poorly visualized.  ------------------------------------------------------------ Right ventricle: The cavity size was normal. Wall thickness was normal. Systolic function was normal.  ------------------------------------------------------------ Ventricular septum: Septal motion showed paradox.  ------------------------------------------------------------ Pulmonic valve: Poorly visualized.  ------------------------------------------------------------ Tricuspid valve: Poorly visualized. Mildly thickened leaflets. Doppler: Trivial regurgitation.  ------------------------------------------------------------ Pulmonary artery: Systolic pressure was moderately increased.  ------------------------------------------------------------ Right atrium: The atrium was mildly dilated.  ------------------------------------------------------------ Pericardium: There was no pericardial effusion.  ------------------------------------------------------------ Systemic veins: Inferior vena cava: The vessel was dilated; the respirophasic diameter changes were blunted (< 50%); findings are consistent with elevated central venous pressure.     Cardiac Cath: right heart catherization 11/09/12 Procedure: Right Heart Cath  Indication: CHF, pulmonary hypertension.  Procedural Details: A peripheral IV was  placed in the right brachial vein prior to procedure. This was exchanged out for  a 75F sheath. A Swan-Ganz catheter was used for the right heart catheterization. Standard protocol was followed for recording of right heart pressures and sampling of oxygen saturations. Fick cardiac output was calculated. There were no immediate procedural complications. The patient was transferred to the post catheterization recovery area for further monitoring.  Procedural Findings:  Hemodynamics (mmHg)  RA mean 9  RV 46/7  PA 49/27, mean 36  PCWP mean 11  Oxygen saturations:  PA 58%  AO 89%  Cardiac Output (Fick) 4.26  Cardiac Index (Fick) 2.01  PVR 5.9 WU  Final Conclusions: Mild to moderate pulmonary arterial hypertension, normal PCWP. I suspect that the pulmonary arterial HTN is due to a combination of COPD with hypoxemia and possible OSA.    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 Pt reports she was in her normal state of health until two days ago when she became short of breath with exertion, while at rest, and when sitting upright. She also reports having a productive cough at baseline but for the past two days it has been worse but dry in nature. She has also had subjective fever with chills. She also has had bilateral mild discomfort in her upper chest that is worse with cough. No nausea, vomiting, diarrhea. She has history of CHF, but has been noncompliant with her home Lasix and has not taken it since last admission. States her leg swelling is at baseline. Denies orthopnea, PND. No recent changes in her diet or changes in weight. She reports fatigue for the past couple of months. She reports being diagnosed with COPD 3 years ago with  exacerbations every 6 months requiring hospitalization with no reported intubation. She is on 4L of home oxygen. She was using spirivia at home but ran out 2-3 months ago. Also has not been using rescue Albuterol inhaler. She continues to smoke 0.5 pack daily. No recent sick contacts or travel. In the ER, her O2 sats on 4L were 85%.   Hospital Course by problem list: Principal Problem:   Acute respiratory failure with hypoxia Active Problems:   OBESITY   TOBACCO ABUSE   CORONARY ARTERY DISEASE   PULMONARY HYPERTENSION, SECONDARY   CHRONIC OBSTRUCTIVE PULMONARY DISEASE   GASTROESOPHAGEAL REFLUX DISEASE   SLEEP APNEA   COPD (chronic obstructive pulmonary disease)   Hyperlipidemia   Coronary artery disease   Diastolic dysfunction   CHF (congestive heart failure)   Hypercapnic respiratory failure, chronic   Metabolic alkalosis   CAP (community acquired pneumonia)   Acute on chronic diastolic heart failure   COPD exacerbation   Asterixis   Pre-diabetes   Acute Respiratory Failure with Hypoxia - Patient presented with worsening dyspnea and found on ABG to have chronic hypercapnia with compensated metabolic alkalosis. BiPaP was not indicated due to chronicity of hypercapnia. Patient was given supplemental oxygen  with oxygen saturation ranging 83-96% as well as dunonebs.   UDS and UA were negative. Patient with improved dyspnea during hospitalization and ambulated on home  4L oxygen with oxygen saturation of 90%.     Metabolic Alkalosis - Patient with chronic hypercapnia with resulting compensatory metabolic alkalosis due to COPD as evidenced by ABG.    Community Acquired Pneumonia - Patient  with dyspnea, cough, rales on exam and mild leukocytosis. Initial chest xray revealed focal opacities in the lung bases and inferior right upper lobe, possibly representing infiltrates. Patient was started on IV ceftriaxone (later discontinued) and azithromycin for 5 days. Blood cultures were negative.  Legionella, strep pneumo antigen were negative. HIV non-reactive.   COPD Exacerbation - Patient with  Gold stage 4 COPD. Patient was given supplemental oxygen to keep oxygen saturation above 88%, systemic corticosteroids, and duonebs. At time of discharge patient was instructed to use dulera twice a day, spiriva once a day, and albuterol as needed for acute symptoms. Patient also to take 42m prednisone for 2 additional days (received total of 7 days ).   Acute on chronic Diastolic Heart Failure - Patient with dyspnea, cough, noncompliance with diuretic therapy, salt rich diet, LE edema, and elevated BNP. Chest xray revealed vascular congestion and bilateral airspace opacities, most likely due to edema.Patient was started on IV lasix. 2-D echo revealed normal EF (55-60%) and pulmonary hypertension. Heart failure team  was consulted who performed right heart catherization due to elevated pulmonary hypertension which revealed normal PCWP.  Patient with weight loss of 11 lbs during hospitalization.  Patient's electrolytes were closely monitored and salt/fluid restricted diet was followed. Patient at time of discharge was instructed to take PO lasix, start with 40 mg PO bid for 7 days then 20 mg PO bid.   Pulmonary Hypertension - 2-D Echo with pulmonary hypertension 64 mmHg. Right heart catherization revealed mild to moderate pulmonary arterial hypertension, normal PCWP and it was suspected that the pulmonary arterial HTN is due to a combination of COPD with hypoxemia and possible OSA. It was recommended she obtain sleep study as outpatient and continue home oxygen.  Coronary Artery Disease - Patient is status post DES in 2010. Initial EKG revealed new inferior T wave inversions as compared to prior EKG with repeat unchanged.  Troponins were negative. Lipid panel revealed elevated cholesterol, triglycerides, LDH, and low HDL.Patient remained chest pain free throughout hospitalization. She was continued on  aspirin and atorvastatin during hospitalization and instructed to take aspirin 852mand mevacor 2040mt home.    Hyperlipidemia - Lipid panel revealed elevated cholesterol, triglycerides, LDH, and low HDL. Patient was continued on atorvastatin therapy throughout hospitalization and instructed to take mevacor 6m45mily.    Pre-diabetes - Patient with  HbA1c 5.8 and glucose levels of  122-348. Patient was started on ISS and lantus 5U.    Hypoalbuminemia - Patient with low albumin most likely due to malnutrition.  Asterixis - Patient with chronic jerking of arms. Neurology was consulted and etiology most likely  asterixis from hypoxia and respiratory acidosis. No further treatment was recommended.    Tobacco Dependence - Patient with history of tobacco use. Nicotine patch was administered during hospitalization as well as smoking cessation counseling.   Obesity - Patient with Obese class II classification with BMI of 39.61.  Discharge Vitals:   BP 116/80  Pulse 86  Temp(Src) 98.7 F (37.1 C) (Oral)  Resp 20  Ht _0  (1.651 m)  Wt 107 kg (235 lb 14.3 oz)  BMI 39.25 kg/m2  SpO2 96%  Discharge Labs:  No results found for this or any previous visit (from the past 24 hour(s)).  Signed: MarjJuluis Mire 11/11/2012, 5:31 PM   Time Spent on Discharge: 60 minutes Services Ordered on Discharge: none Equipment Ordered on Discharge: home portable oxygen

## 2012-11-12 LAB — CULTURE, BLOOD (ROUTINE X 2): Culture: NO GROWTH

## 2012-11-16 ENCOUNTER — Encounter: Payer: Self-pay | Admitting: Internal Medicine

## 2012-11-16 ENCOUNTER — Ambulatory Visit (INDEPENDENT_AMBULATORY_CARE_PROVIDER_SITE_OTHER): Payer: Medicare Other | Admitting: Internal Medicine

## 2012-11-16 VITALS — BP 114/71 | HR 80 | Temp 97.6°F | Ht 65.0 in | Wt 242.6 lb

## 2012-11-16 DIAGNOSIS — J449 Chronic obstructive pulmonary disease, unspecified: Secondary | ICD-10-CM

## 2012-11-16 DIAGNOSIS — R252 Cramp and spasm: Secondary | ICD-10-CM

## 2012-11-16 DIAGNOSIS — E669 Obesity, unspecified: Secondary | ICD-10-CM

## 2012-11-16 DIAGNOSIS — I519 Heart disease, unspecified: Secondary | ICD-10-CM

## 2012-11-16 DIAGNOSIS — I5189 Other ill-defined heart diseases: Secondary | ICD-10-CM

## 2012-11-16 MED ORDER — FLUTICASONE-SALMETEROL 100-50 MCG/DOSE IN AEPB: 1.0000 | INHALATION_SPRAY | Freq: Two times a day (BID) | RESPIRATORY_TRACT | Status: DC

## 2012-11-16 NOTE — Patient Instructions (Addendum)
General Instructions: -Use Spiriva, Advair, as instructed. Albuterol inhaler as needed.  -Follow up with your lung doctor.  -Follow up with your heart failure doctor.  -Take Lasix 39m twice per day, last dose no later than 4PM to avoid going to the bathroom at night.  -Limit your fluid intake to 1.5-2L per day.  -Follow up with [facility] two weeks.    Treatment Goals:  Goals (1 Years of Data) as of 11/16/12     Lifestyle    . Quit smoking / using tobacco       Progress Toward Treatment Goals:  Treatment Goal 11/16/2012  Stop smoking smoking less  Prevent falls improved    Self Care Goals & Plans:  Self Care Goal 11/16/2012  Manage my medications take my medicines as prescribed; bring my medications to every visit; refill my medications on time  Eat healthy foods drink diet soda or water instead of juice or soda; eat more vegetables; eat foods that are low in salt; eat baked foods instead of fried foods  Be physically active take a walk every day  Stop smoking call [facility] (1-800-QUIT-NOW)       Care Management & Community Referrals:  Referral 11/16/2012  Referrals made for care management support none needed  Referrals made to community resources none

## 2012-11-17 LAB — BASIC METABOLIC PANEL WITH GFR
BUN: 10 mg/dL (ref 6–23)
CO2: 36 mEq/L — ABNORMAL HIGH (ref 19–32)
Chloride: 95 mEq/L — ABNORMAL LOW (ref 96–112)
Glucose, Bld: 107 mg/dL — ABNORMAL HIGH (ref 70–99)
Potassium: 4.1 mEq/L (ref 3.5–5.3)
Sodium: 139 mEq/L (ref 135–145)

## 2012-11-17 NOTE — Assessment & Plan Note (Addendum)
She finished her prednisone taper and feels much better. No indication to prescribed doxycycline at this time.  Rx for Advair, Spiriva, and albuterol inhaler as well as OPC samples for these medications.  Referral to Pulmonology given concomitant pulmonary hypertension.

## 2012-11-17 NOTE — Progress Notes (Signed)
  Subjective:    Patient ID: [name], female    DOB: 03-03-1956, 57 y.o.   MRN: [id number]  HPI [name] is a 57 year old woman with PMH significant for COPD on home O2 at 4L continuous, obesity, tobacco use, pulmonary hypertension, and CHF who presents for HFU visit. She was hospitalized for management of acute respiratory failure with hypoxia thought to be secondary to volume overload and COPD exacerbation. She states that since her discharge her shortness of breath is back to baseline. She never took doxycycline but states that she did not have a cough or increased sputum production. Today she starts Lasix 19m BID, she took Lasix 475mBID for 7 days as instructed and had increased diuresis. She has dizziness upon standing but no falls.  She is unable to pay the copays for her albuterol inhaler, Spiriva, and Advair.  She was exposed to pink eye last week but denies eye discharge, itching/redness.    Review of Systems  Constitutional: Negative for fever, chills, diaphoresis, activity change, appetite change and fatigue.  Eyes: Positive for redness. Negative for discharge and itching.       Chronic dry eyes  Respiratory: Positive for shortness of breath. Negative for cough, chest tightness and wheezing.   Cardiovascular: Negative for chest pain, palpitations and leg swelling.  Gastrointestinal: Negative for abdominal distention.  Genitourinary: Positive for frequency. Negative for dysuria.  Skin: Negative for color change, pallor, rash and wound.  Neurological: Positive for dizziness. Negative for syncope.  Psychiatric/Behavioral: Negative for behavioral problems and agitation.       Objective:   Physical Exam  Nursing note and vitals reviewed. Constitutional: She is oriented to person, place, and time. She appears well-developed and well-nourished. No distress.  Obese  Eyes: Right eye exhibits no discharge. Left eye exhibits no discharge. No scleral icterus.  Mild redness of bl  conjunctiva   Cardiovascular: Normal rate and regular rhythm.   Pulmonary/Chest: Effort normal and breath sounds normal. No respiratory distress. She has no wheezes. She has no rales.  Abdominal: Soft.  Musculoskeletal: She exhibits edema. She exhibits no tenderness.  Trace pretibial edema  Neurological: She is alert and oriented to person, place, and time.  Skin: Skin is warm and dry. No rash noted. She is not diaphoretic. No erythema. No pallor.  Psychiatric: She has a normal mood and affect. Her behavior is normal.          Assessment & Plan:

## 2012-11-17 NOTE — Assessment & Plan Note (Addendum)
Pt now back to her regular Lasix dose of 65m BID, reports SOB at her baseline. Pt with O2 sats >90% with ambulation and continuous 2L O2 (pt's portable O2 tank delivers only up to 2L continuous and 4L pulse).  Referral to HF clinic given concomitant COPD and pulmonary hypertension.  BMET with nl K

## 2012-11-19 NOTE — Progress Notes (Signed)
Case discussed with Dr. Hayes Ludwig soon after the resident saw the patient.  We reviewed the resident's history and exam and pertinent patient test results.  I agree with the assessment, diagnosis, and plan of care documented in the resident's note.

## 2012-11-22 ENCOUNTER — Other Ambulatory Visit: Payer: Self-pay | Admitting: Internal Medicine

## 2012-11-22 DIAGNOSIS — J449 Chronic obstructive pulmonary disease, unspecified: Secondary | ICD-10-CM

## 2012-11-29 NOTE — Discharge Summary (Signed)
  Date: 11/29/2012  Patient name: [name]  Medical record number: [id number]  Date of birth: 12/26/1955   This patient has been discussed with the house staff. Please see their note for complete details. I concur with their findings and plan.  Dominic Pea, DO, [facility] 11/29/2012, 9:01 AM

## 2012-11-30 ENCOUNTER — Ambulatory Visit: Payer: Medicare Other | Admitting: Internal Medicine

## 2012-12-05 ENCOUNTER — Encounter (HOSPITAL_COMMUNITY): Payer: Medicare Other

## 2013-01-05 IMAGING — CR DG CHEST 2V
2 series · 2 of 2 positions shown · non-contrast
Comparison: 03/11/2010

CLINICAL DATA: Chest pain

CHEST - 2 VIEW

[w chest pa]
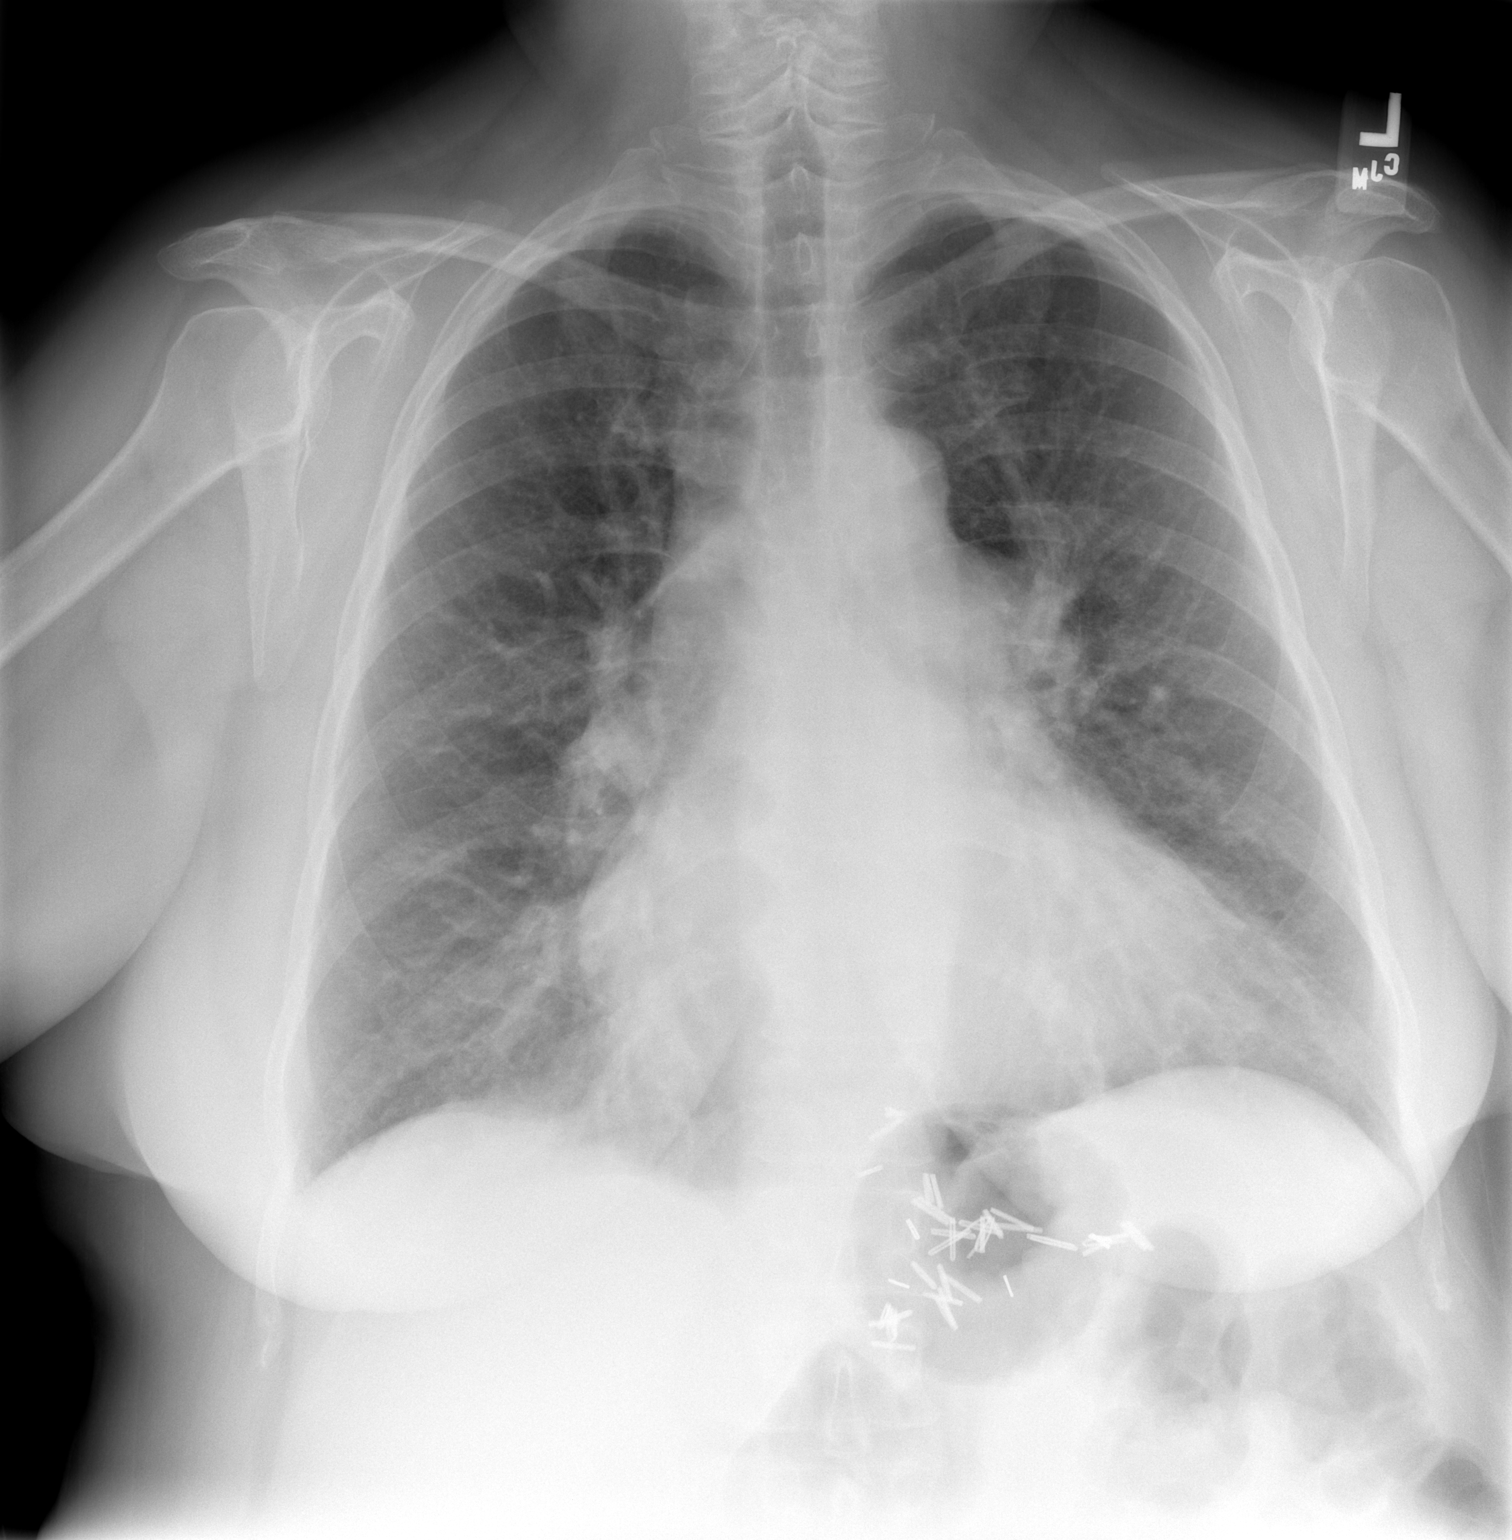

[w chest lat]
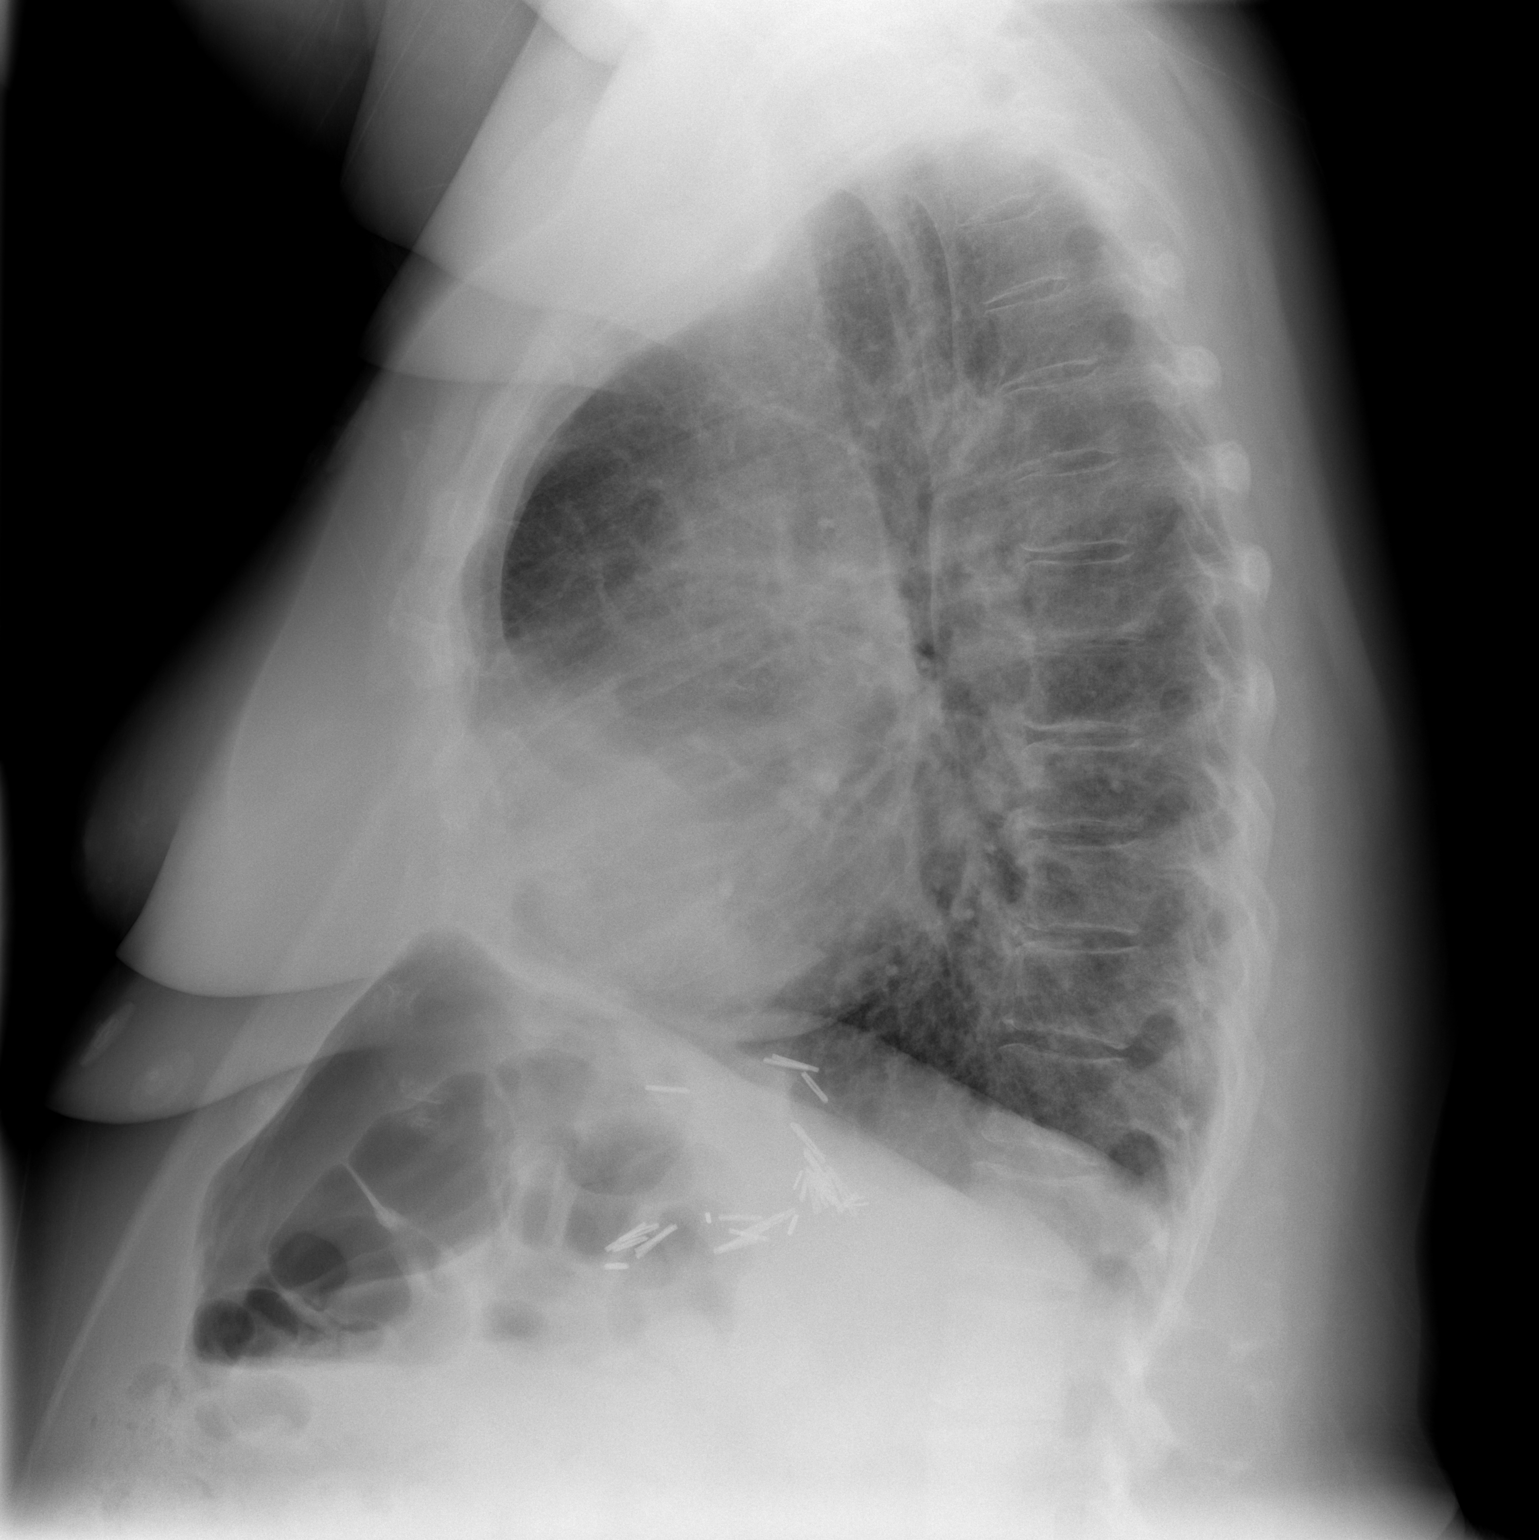

[2 of 2 positions shown; findings below may reference images not displayed]

FINDINGS: Moderate cardiomegaly.  Vascular congestion.  No Kerley B
lines.  No pneumothorax or pleural effusion.  Chronic basilar lung
opacities.
IMPRESSION: Cardiomegaly and vascular congestion.  Chronic changes.

## 2013-01-08 ENCOUNTER — Encounter (HOSPITAL_COMMUNITY): Payer: Medicare Other

## 2013-01-09 ENCOUNTER — Encounter: Payer: Self-pay | Admitting: Internal Medicine

## 2013-01-09 ENCOUNTER — Ambulatory Visit (HOSPITAL_COMMUNITY)
Admission: RE | Admit: 2013-01-09 | Discharge: 2013-01-09 | Disposition: A | Discharge status: Home / Self Care | Payer: Medicare Other | Source: Ambulatory Visit | Attending: Internal Medicine | Admitting: Internal Medicine

## 2013-01-09 ENCOUNTER — Ambulatory Visit (INDEPENDENT_AMBULATORY_CARE_PROVIDER_SITE_OTHER): Payer: Medicare Other | Admitting: Internal Medicine

## 2013-01-09 ENCOUNTER — Encounter: Payer: Self-pay | Admitting: Licensed Clinical Social Worker

## 2013-01-09 VITALS — BP 100/62 | HR 80 | Temp 97.7°F | Ht 65.0 in | Wt 251.8 lb

## 2013-01-09 DIAGNOSIS — J441 Chronic obstructive pulmonary disease with (acute) exacerbation: Secondary | ICD-10-CM

## 2013-01-09 DIAGNOSIS — R059 Cough, unspecified: Secondary | ICD-10-CM | POA: Insufficient documentation

## 2013-01-09 DIAGNOSIS — R509 Fever, unspecified: Secondary | ICD-10-CM | POA: Insufficient documentation

## 2013-01-09 DIAGNOSIS — J029 Acute pharyngitis, unspecified: Secondary | ICD-10-CM | POA: Insufficient documentation

## 2013-01-09 DIAGNOSIS — M546 Pain in thoracic spine: Secondary | ICD-10-CM | POA: Insufficient documentation

## 2013-01-09 DIAGNOSIS — R05 Cough: Secondary | ICD-10-CM | POA: Insufficient documentation

## 2013-01-09 DIAGNOSIS — I509 Heart failure, unspecified: Secondary | ICD-10-CM

## 2013-01-09 LAB — CBC WITH DIFFERENTIAL/PLATELET
Basophils Absolute: 0 10*3/uL (ref 0.0–0.1)
Eosinophils Absolute: 0.3 10*3/uL (ref 0.0–0.7)
Eosinophils Relative: 5 % (ref 0–5)
Lymphocytes Relative: 23 % (ref 12–46)
Lymphs Abs: 1.5 10*3/uL (ref 0.7–4.0)
MCH: 30.5 pg (ref 26.0–34.0)
MCV: 91.9 fL (ref 78.0–100.0)
Neutrophils Relative %: 55 % (ref 43–77)
Platelets: 143 10*3/uL — ABNORMAL LOW (ref 150–400)
RBC: 4.59 MIL/uL (ref 3.87–5.11)
RDW: 14.8 % (ref 11.5–15.5)
WBC: 6.5 10*3/uL (ref 4.0–10.5)

## 2013-01-09 LAB — COMPLETE METABOLIC PANEL WITH GFR
ALT: 29 U/L (ref 0–35)
Albumin: 3.7 g/dL (ref 3.5–5.2)
BUN: 8 mg/dL (ref 6–23)
CO2: 36 mEq/L — ABNORMAL HIGH (ref 19–32)
Calcium: 9.1 mg/dL (ref 8.4–10.5)
Chloride: 99 mEq/L (ref 96–112)
Creat: 0.69 mg/dL (ref 0.50–1.10)
GFR, Est African American: >89 mL/min
Potassium: 4.1 mEq/L (ref 3.5–5.3)
Sodium: 138 mEq/L (ref 135–145)

## 2013-01-09 MED ORDER — DOXYCYCLINE HYCLATE 50 MG PO CAPS: 100.0000 mg | ORAL_CAPSULE | Freq: Two times a day (BID) | ORAL | Status: DC

## 2013-01-09 MED ORDER — FUROSEMIDE 20 MG PO TABS: 40.0000 mg | ORAL_TABLET | ORAL | Status: DC

## 2013-01-09 MED ORDER — PREDNISONE 20 MG PO TABS: 40.0000 mg | ORAL_TABLET | Freq: Every day | ORAL | Status: DC

## 2013-01-09 MED ORDER — METHYLPREDNISOLONE SODIUM SUCC 125 MG IJ SOLR
125.0000 mg | Freq: Once | INTRAMUSCULAR | Status: AC
  Administered 2013-01-09: 125 mg via INTRAMUSCULAR

## 2013-01-09 MED ORDER — METHYLPREDNISOLONE SODIUM SUCC 125 MG IJ SOLR: 80.0000 mg | Freq: Once | INTRAMUSCULAR | Status: DC

## 2013-01-09 MED ORDER — IPRATROPIUM BROMIDE 0.02 % IN SOLN: 0.5000 mg | RESPIRATORY_TRACT | Status: DC | PRN

## 2013-01-09 NOTE — Patient Instructions (Addendum)
Think of your respiratory medications as multiple steps you can take to control your symptoms and avoid having to go to the ER  1. Plan A is your maintenance daily no matter what meds: Take 2 puffs of Dulera inhaler first thing in am and then another 2 puffs about 12 hours later. Hold each breath about 5 seconds and work on a smooth deep full insiration to get all the medication all the way into your lungs. Rinse and gargle after use and if any mouth or throat irritation then use [employer] toothpaste after each use.  2. Plan B only use after you've used your maintenance (Plan A) medication, and only if you can't catch your breath: Use Albuterol inhaler 2 puffs every 30 minute. You may repeat this medications up 4 times a day if not relief  3. Plan C only use after you've used plan A and B and still can't catch your breath: Albuterol nebulizer. I will call in Atrovent nebulizer as well. These two will work better in combination   3. Plan D (for Doctor):  If you've used A thru C and not doing a lot better or still needing C more than a once a day,  D = call the doctor for evaluation asap  4. Plan E (for ER):  If still not able to catch your breath, even after using your nebulizer up to every 4 hours, go to ER   Please take the prednisone, 50 mg every day for 5 days starting tomorrow, 01/10/2013  Please take doxycycline 100 mg twice a day for the next one week  Please come back to the clinic on Friday, 01/11/2013 for evaluation  Please take your Lasix pills 40 mg every morning. You need to take this medication daily to help with your fluids.  Please stop taking Flovent inhaler  I will ask our social worker Edwena Blow, to help you with transportation problems.

## 2013-01-09 NOTE — Assessment & Plan Note (Addendum)
She admits to noncompliance with her Lasix. She also reports that she has not been evaluated at heart failures clinic. Weight is up by 16 pounds in the last 8 weeks. Dry wt is around 234Lb. Chest x ray with possible pulmonary edema.  Plan. - Changed to furosemide 40 mg every morning as opposed to 20 mg twice a day. This will help with nocturia and improve her compliance. - Once she is stable from a COPD exacerbation, will discuss referring her to heart failure clinic

## 2013-01-09 NOTE — Progress Notes (Signed)
Patient ID: [name], female   DOB: [birth year], 57 y.o.   MRN: [id number]   Subjective:   HPI: MorganDannon B [name] is a 57 y.o. woman with past medical history of chronic respiratory failure due to COPD, on home oxygen 4 L per minute, morbidly obese, coronary artery disease, CHF, and obstructive sleep apnea, presents to the clinic with increasing shortness of breath and cough for 5 days.  Symptoms have been present for 5 days. At baseline, the patient is short of breath on moderate ambulation, but she is able to do her house chores with minimal difficulty. Since 5 days ago, she became more short of breath, with increased sputum production and wheezing. The sputum is green in color without blood. She report chest pain and abdominal pain which she attributes to coughing. She reports that 4 days prior to presentation she checked her temperature and it was 101.3. She reports sore throat, increased sneezing. No ear pain or drainage. She used her regular albuterol inhaler on the morning of presentation to the clinic. Normally she uses it 2 times a day but recently she has been using it up 4 time a day without much relief. She does also sometimes use albuterol inhaler once a day. She did not use it today. She reports that she has not been using her Dulera for 2 months but she used Flovent inhaler yesterday. She has significant difficulties affording her medications including inhalers in combination with noncompliance.   She denies history of a sick contact. She denies chills, fevers, or rigors. She reports increased fatigue. She reports nausea, but no vomiting, or diarrhea. No urinary symptoms.  Patient has gained almost 16 pounds over the last 2 months. She reports, that she's not been taking her frusemide 20 mg bid for the past 3 weeks due to nocturia.   Past Medical History  Diagnosis Date  . Coronary artery disease     S/P PCI of LAD with DES (12/2008). Total occlusion of RCA noted at that time.,  medically managed. ACS ruled out 03/2009 with Lexiscan myoview . Followed by [facility].  . Pulmonary hypertension     2-D Echo (35/4562) - Systolic pressure was moderately increased. PA peak pressure  62mHg. 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 Followed by Dr. WJoya Gaskins([facility] Heckler  . Diastolic dysfunction     2-D Echo (12/2008) - Normal LV Systolic funciton with EF 60-65%. Grade 1 diastolid dysfunction. No regional wall motion abnormalities. Moderate pulmonary HTN with PA peak pressure 535mg.  . Marland KitchenOPD (chronic obstructive pulmonary disease)     Severe. Gold Stage IV.  PFTs (12/2008) - severe obstructive airway disease. Active tobacco use. Requires 4L O2 at home.  . Pulmonary nodule, right     Small right middle lobe nodule. Stable as of 12/2008.  . Marland Kitchenleep apnea     Presumptive. No documented sleep studies.   . Prediabetes 12/2008    HgbA1c 6.4 (12/2008)  . Hx MRSA infection     Recurrent MRSA thigh abscesses.  . Tobacco abuse     Ongoing.  . Obesity   . Hyperlipidemia   . GERD (gastroesophageal reflux disease)     S/P Nissen fundoplication.  . Shortness of breath   . CHF (congestive heart failure)   . HeBWLSLHTD(428.7   Current Outpatient Prescriptions  Medication Sig Dispense Refill  . albuterol (PROVENTIL HFA;VENTOLIN HFA) 108 (90 BASE) MCG/ACT inhaler Inhale  1 puff into the lungs every 4 (four) hours as needed for wheezing or shortness of breath.  1 Inhaler  11  . aspirin 81 MG chewable tablet Chew 1 tablet (81 mg total) by mouth daily.  30 tablet  11  . doxycycline (VIBRAMYCIN) 50 MG capsule Take 2 capsules (100 mg total) by mouth 2 (two) times daily.  28 capsule  0  . fluticasone (FLOVENT HFA) 110 MCG/ACT inhaler Inhale 1 puff into the lungs 2 (two) times daily.      . furosemide (LASIX) 20 MG tablet Take 2 tablets (40 mg total) by mouth every morning.  90 tablet  11  .  lovastatin (MEVACOR) 20 MG tablet Take 1 tablet (20 mg total) by mouth at bedtime.  30 tablet  11  . nicotine (NICODERM CQ - DOSED IN MG/24 HOURS) 14 mg/24hr patch Place 1 patch onto the skin daily.  28 patch  0  . [START ON 01/10/2013] predniSONE (DELTASONE) 20 MG tablet Take 2 tablets (40 mg total) by mouth daily.  10 tablet  0  . mometasone-formoterol (DULERA) 100-5 MCG/ACT AERO Inhale 2 puffs into the lungs.       No current facility-administered medications for this visit.   Family History  Problem Relation Age of Onset  . Heart disease Mother 19    Deceased from MI at 77yo  . Hypertension Mother   . Heart disease Father 22    Deceased of MI age 5yo  . Hypertension Father   . Hypertension Brother   . Lung cancer      Grandmother   History   Social History  . Marital Status: Single    Spouse Name: N/A    Number of Children: N/A  . Years of Education: N/A   Social History Main Topics  . Smoking status: Current Every Day Smoker -- 0.50 packs/day for 40 years    Types: Cigarettes  . Smokeless tobacco: Never Used  . Alcohol Use: No  . Drug Use: No  . Sexual Activity: No   Other Topics Concern  . None   Social History Narrative   Formerly worked as a Scientist, water quality, now disabled.   Divorced.   2 grown children.   Lives with her grandson.   Review of Systems: Constitutional: Denies fever, chills, diaphoresis, appetite change and fatigue.  Cardiovascular: No chest pain, palpitations. She reports leg swelling.  Gastrointestinal:reports abdominal pain - due to coughing. Denies Nausea, vomiting, bloody stools Genitourinary: No dysuria, frequency, hematuria, or flank pain.  Musculoskeletal: reports myalgias and arthralgias  Psych: No depression symptoms. No SI or SA.   Objective:  Physical Exam: Filed Vitals:   01/09/13 0827  BP: 100/62  Pulse: 80  Temp: 97.7 F (36.5 C)  TempSrc: Oral  Height: _0  (1.651 m)  Weight: 251 lb 12.8 oz (114.216 kg)  SpO2: 82%    General: Obese.  MMM. On 4/L and saturates at 94% (82% at arrival on pulsatile oxygen delivery device). Poor inhaler technique (less than 50%).  Lungs: mild respiratory distress. Talking in full sentences, bilateral wheezing.  HEENT: marginal airway but not exudates or hyperemia of the oral pharynx.  Heart: RRR; no extra sounds or murmurs  Abdomen: Non-distended, normal BS, soft, nontender; no hepatosplenomegaly  Extremities:bilateral pedal edema to level of knees. No joint swelling or tenderness. Neurologic: Normal EOM,  Alert and oriented x3. No obvious neurologic/cranial nerve deficits.  Assessment & Plan:  I have discussed my assessment and plan  with  my attending  in the clinic, Dr. Lynnae January  as detailed under problem based charting.

## 2013-01-09 NOTE — Progress Notes (Signed)
Ms. Bandel was referred to CSW for assistance with transportation and medication procurement.  Pt is in need of inhaler but cost through insurance copayment is out of patient's ability to pay.  CSW met with Ms. Kilian following her scheduled [facility] appt.  Pt has Medicare Part D, but has not applied for the ExtraHelp program.  CSW provided Ms. Husk with information on ExtraHelp, pt will need to provide documentation that she is ineligible for future prescription assistance programs.  Pt is over income for Medicaid benefits.  Discussed transportation concerns, pt is currently using her roommate's car.  And, can you car for all morning appt.  Roommate goes in to work at noon.  Pt states her O2 tanks will not last beyond 2hrs, therefore she does not use public transportation.  CSW obtain applications for both [employer] and [employer]air patient assistance programs.  Will provide information to Ms. Gossman at her next appointment.

## 2013-01-09 NOTE — Assessment & Plan Note (Signed)
The patient's clinical presentation is consistent with COPD exacerbation as a result medication noncompliance, poor inhaler technique and lack of proper medications due to cost. Triggers of her current exacerbation is likely viral versus bacteria. Her oxygen saturation improved from 82% to 94% when she was put back on 4 L of home oxygen continuous flow. Chest exam with bilateral moderate wheezing. There is also component of CHF.  Plan. - Chest x-ray performed reveals bilateral, pulmonary scarring, versus pulmonary edema. No pneumonia - Outpatient management - IM Solu-Medrol 80 mg administered in the clinic - Continue with prednisone 40 mg daily for 5 days. - Doxycycline 100 mg twice a day for one week. - CBC, and CMP - Demonstrated to the patient the proper inhaler technique. - Discussed with social work about possibility of providing with Spiriva. She has Dulera and albuterol inhalers. She will also require assistance with transportation to the clinic.  - We will add Atrovent nebulizer in addition to albuterol - Stopped Flovent - Change her Lasix from 20 mg twice a day to 40 mg every morning to help with nocturia and thus better compliance. Encouraged her to restarted taking it as this is likely to help her breathing. - re-Evaluation on Friday, 01/11/2013.

## 2013-01-10 NOTE — Progress Notes (Signed)
I saw and evaluated the patient.  I personally confirmed the key portions of the history and exam documented by Dr. Alice Rieger and I reviewed pertinent patient test results.  The assessment, diagnosis, and plan were formulated together and I agree with the documentation in the resident's note.

## 2013-01-11 ENCOUNTER — Encounter: Payer: Self-pay | Admitting: Internal Medicine

## 2013-01-11 ENCOUNTER — Ambulatory Visit: Payer: Medicare Other

## 2013-01-15 ENCOUNTER — Encounter: Payer: Self-pay | Admitting: Licensed Clinical Social Worker

## 2013-01-15 NOTE — Progress Notes (Signed)
Pt was a no show to her next appointment.  Letter and information mailed out on 01/15/13

## 2013-01-16 ENCOUNTER — Encounter (HOSPITAL_COMMUNITY): Payer: Medicare Other

## 2013-02-04 ENCOUNTER — Telehealth (HOSPITAL_COMMUNITY): Payer: Self-pay

## 2013-02-04 NOTE — Telephone Encounter (Signed)
I have called and left a message with Gretchen to inquire about participation in [facility]monary [facility]. Will send letter in mail and follow up.

## 2013-02-06 ENCOUNTER — Encounter: Payer: Self-pay | Admitting: Internal Medicine

## 2013-02-06 ENCOUNTER — Ambulatory Visit (INDEPENDENT_AMBULATORY_CARE_PROVIDER_SITE_OTHER): Payer: Medicare Other | Admitting: Internal Medicine

## 2013-02-06 VITALS — BP 102/66 | HR 78 | Temp 98.8°F | Wt 247.9 lb

## 2013-02-06 DIAGNOSIS — H6690 Otitis media, unspecified, unspecified ear: Secondary | ICD-10-CM | POA: Insufficient documentation

## 2013-02-06 DIAGNOSIS — Z0000 Encounter for general adult medical examination without abnormal findings: Secondary | ICD-10-CM

## 2013-02-06 DIAGNOSIS — I509 Heart failure, unspecified: Secondary | ICD-10-CM

## 2013-02-06 DIAGNOSIS — T148XXA Other injury of unspecified body region, initial encounter: Secondary | ICD-10-CM | POA: Insufficient documentation

## 2013-02-06 DIAGNOSIS — J449 Chronic obstructive pulmonary disease, unspecified: Secondary | ICD-10-CM

## 2013-02-06 DIAGNOSIS — H6691 Otitis media, unspecified, right ear: Secondary | ICD-10-CM

## 2013-02-06 DIAGNOSIS — Z23 Encounter for immunization: Secondary | ICD-10-CM

## 2013-02-06 MED ORDER — AMOXICILLIN 500 MG PO CAPS: 500.0000 mg | ORAL_CAPSULE | Freq: Two times a day (BID) | ORAL | Status: DC

## 2013-02-06 MED ORDER — CYCLOBENZAPRINE HCL 5 MG PO TABS: 5.0000 mg | ORAL_TABLET | Freq: Three times a day (TID) | ORAL | Status: DC | PRN

## 2013-02-06 NOTE — Assessment & Plan Note (Signed)
Patient reports noncompliance with her Lasix secondary to inability to tolerate urinary frequency when she does take it. Patient has been taking her Lasix 40 mg as needed when she notices increased shortness of breath or bilateral lower extremity edema. I discussed the patient that she should be taking Lasix 40 mg every morning, however understanding that she has difficulty with this regimen and is unable to leave the house secondary to increased  urinary frequency, I told her to at least take 20 mg every morning if she can tolerate that as opposed to not taking any Lasix at all. I encouraged her to take Lasix 40 mg every morning if she is able to as this is the prescribed dose. Today patient's lungs are clear and she does not have much bilateral lower should the edema, therefore do not believe she is fluid overloaded. However her weight is up at 247.9 pounds, dry weight reported at 234 pounds. I asked her to take the Lasix 40 mg dose today and tomorrow at the very least given her weight is up.

## 2013-02-06 NOTE — Assessment & Plan Note (Signed)
Patient has some shortness of breath at baseline, however is able to ambulate and get around. She feels as though her breathing is almost at baseline though perhaps slightly worse than usual. I believe this might be due to the fact that she could be slightly volume overloaded given Lasix noncompliance. I do not have concern for acute COPD exacerbation at this time. Patient reports that she is out of her albuterol inhaler and has difficulty affording her medications therefore I gave her a free sample of albuterol today.  Patient received the flu shot today.

## 2013-02-06 NOTE — Assessment & Plan Note (Signed)
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 Patient requests muscle relaxer to help with this pain. I prescribed Flexeril at 5 mg to take 3 times a day as needed for back pain, #15.

## 2013-02-06 NOTE — Progress Notes (Signed)
Patient ID: [name], female   DOB: May 16, 1955, 57 y.o.   MRN: [id number] HPI The patient is a 57 y.o. female with a history of chronic respiratory failure due to COPD, on home oxygen 4 L per minute, morbid obesity, coronary artery disease, grade 1 diastolic CHF abuse, mild to moderate pulmonary hypertension presents to the clinic for an acute visit.  Her main complaint today is right ear pain that began Saturday morning when she woke up. She reports having dull achy constant pain with throbbing in her right ear, reporting it feels like" my ear is stopped up." Patient denies swimming recently or contact with any body of water. She does shower regularly and when she gets water in her ear she uses a Q-tip to remove the water. No recent trauma to her ear. The pain is interfering with her sleep.  She was last seen in clinic on October 8 by her PCP at which time she was diagnosed with an acute PD exacerbation attributed to medication noncompliance. Labs at this time were markedly unremarkable aside from a chronically elevated bicarbonate. Chest x-ray at this time showed mild perihilar and bibasal or edema versus scarring. Patient was given Solu-Medrol 80 mg IM in the clinic and sent home on prednisone 40 mg for a 5 day course. She was also given doxycycline 100 mg twice a day for a week. At her last clinic visit, Flovent was stopped, Atrovent nebulizer was added to her regimen and dulera was continued. However today Atrovent and does not appear her medication list. Patient was scheduled for a followup appointment on October 10 but she was a no-show. Today patient feels overall improved from her last visit. She finished the course of doxycycline and prednisone, which helped improve her symptoms. Over the last few days she notes having some worsening of her SOB and mild sputum production, but this is much less than her usual exacerbations. At baseline, the patient is short of breath on moderate ambulation, but she  is able to do her house chores with minimal difficulty- today she feels almost at baseline. She has been taking her albuterol inhaler twice daily, her duoneb once per week, and dulera twice daily. He no longer uses Flovent. She was not able to fill the Atrovent because she was not able to afford it. She still uses home O2 at 4LPM. Today she denies bilateral lower extremity edema, chest pain, nausea vomiting, sore throat, nasal congestion, conjunctivitis, headache, fevers, chills.  Patient has history of pulmonary hypertension attributed to COPD in combination with likely obstructive sleep apnea. She also has a history of diastolic CHF. She was hospitalized in August 2014 for acute on chronic respiratory failure attributed to COPD exacerbation with contributing acute on chronic diastolic CHF. Echo at this time showed preserved EF 55% with elevated pulmonary pressures 64 mmHg, questionable wall motion abnormalities. A right heart cath was done which showed mild to moderate pulmonary hypertension. Patient is on Lasix 40 mg by mouth every morning (this had been changed at her last appointment from 20 mg twice a day secondary to nocturia). Today weight is 247.9lbs. Her dry weight is reportedly 234 pounds. She reports taking her lasix only as needed (when she notices increasing SOB or BLE edema) since she is unable to tolerate the increased urinary frequency during the day.   Patient also reports she's helping to take care of a sick friend and is having to lift her and help transfer the patient. He reports 2 days ago  she strained her back and is now having some back pain. She is requesting Flexeril to help with this just for a couple of days.  ROS: General: no fevers, chills, changes in weight, changes in appetite Skin: no rash HEENT: no blurry vision, hearing changes, sore throat Pulm: no dyspnea, coughing, wheezing CV: no chest pain, palpitations, shortness of breath Abd: no abdominal pain, nausea/vomiting,  diarrhea/constipation GU: no dysuria, hematuria, polyuria Ext: no arthralgias, myalgias Neuro: no weakness, numbness, or tingling  Filed Vitals:   02/06/13 0931  BP: 102/66  Pulse: 78  Temp: 98.8 F (37.1 C)  Weight is 247lbs  Physical exam: General: alert, cooperative, and in no apparent distress; on 4LPM O2 HEENT: pupils equal round and reactive to light, vision grossly intact, oropharynx clear and non-erythematous; R TM w/ erythema and what looks like an injury to her external auditory canal, no exudate or drainage of any kind Neck: supple, no lymphadenopathy Lungs: clear to ascultation bilaterally, normal work of respiration, no wheezes, rales, ronchi Heart: regular rate and rhythm, no murmurs, gallops, or rubs Abdomen: obese, soft, non-tender, non-distended, normal bowel sounds Extremities: warm extremities bilaterally, unable to palpate DP pulses, trace BLE edema, xerosis to BLE  Neurologic: alert & oriented X3, cranial nerves II-XII grossly intact, strength grossly intact, sensation intact to light touch  Current Outpatient Prescriptions on File Prior to Visit  Medication Sig Dispense Refill  . albuterol (PROVENTIL HFA;VENTOLIN HFA) 108 (90 BASE) MCG/ACT inhaler Inhale 1 puff into the lungs every 4 (four) hours as needed for wheezing or shortness of breath.  1 Inhaler  11  . aspirin 81 MG chewable tablet Chew 1 tablet (81 mg total) by mouth daily.  30 tablet  11  . doxycycline (VIBRAMYCIN) 50 MG capsule Take 2 capsules (100 mg total) by mouth 2 (two) times daily.  28 capsule  0  . fluticasone (FLOVENT HFA) 110 MCG/ACT inhaler Inhale 1 puff into the lungs 2 (two) times daily.      . furosemide (LASIX) 20 MG tablet Take 2 tablets (40 mg total) by mouth every morning.  90 tablet  11  . ipratropium (ATROVENT) 0.02 % nebulizer solution Take 2.5 mLs (0.5 mg total) by nebulization every 4 (four) hours as needed for wheezing.  75 mL  2  . lovastatin (MEVACOR) 20 MG tablet Take 1 tablet  (20 mg total) by mouth at bedtime.  30 tablet  11  . mometasone-formoterol (DULERA) 100-5 MCG/ACT AERO Inhale 2 puffs into the lungs.      . nicotine (NICODERM CQ - DOSED IN MG/24 HOURS) 14 mg/24hr patch Place 1 patch onto the skin daily.  28 patch  0  . predniSONE (DELTASONE) 20 MG tablet Take 2 tablets (40 mg total) by mouth daily.  10 tablet  0   No current facility-administered medications on file prior to visit.    Assessment/Plan

## 2013-02-06 NOTE — Assessment & Plan Note (Signed)
I ordered a diagnostic bilateral mammogram given patient did have abnormalities on previous mammogram. I also ordered a colonoscopy for patient. She received the flu shot. Patient does not want to do a sleep study and she is claustrophobic and would not use the CPAP or BiPAP provided to her. Her pulmonary hypertension is thought to be due to a combination of OSA and COPD. I told her that if she changes her mind she can pursue the sleep study.

## 2013-02-06 NOTE — Assessment & Plan Note (Signed)
Patient presents with right ear pain that has been going on for 4 days. Exam there is some erythema to the ear canal and right tympanic membrane, no bulging of the tympanic membrane. There is also an area of irregularity in the ear canal that looks to be injured, however no active bleeding, discharge. I'm unable to elicit pain with deletion of the pinna, therefore do not believe this represents otitis externa. It is unclear if patient did in fact injure her ear canal, however she does not report any previous injury. She does use Q-tips regularly. I believe the current symptoms may be due to otitis media. -Amoxicillin 500 mg every 12 hours for 7 days -Tylenol when necessary for pain -Instructed patient to followup if her symptoms do not improve over the next week

## 2013-02-06 NOTE — Patient Instructions (Signed)
You for your visit today. Please return to clinic in 2 months to followup with your primary care doctor. However if your symptoms do not resolve within a week after you finish your antibiotic please call and make an appointment in our clinic or go to the emergency department.  For your ear pain, I gave you a seven-day course of amoxicillin 500 mg to take daily.   For your back strain, I gave you Flexeril should last you approximately 5 days.  I also submitted an order for a mammogram as well as a colonoscopy. They should call you to set these appointments up.  You received the flu shot today.

## 2013-02-07 ENCOUNTER — Encounter: Payer: Self-pay | Admitting: Internal Medicine

## 2013-02-07 NOTE — Progress Notes (Signed)
I saw and evaluated the patient.  I personally confirmed the key portions of the history and exam documented by Dr. Joan Flores and I reviewed pertinent patient test results.  The assessment, diagnosis, and plan were formulated together and I agree with the documentation in the resident's note.

## 2013-02-22 ENCOUNTER — Telehealth: Payer: Self-pay | Admitting: *Deleted

## 2013-02-22 NOTE — Telephone Encounter (Signed)
[facility] ([id number]). THEY CALLED THE PATIENT ON November 3. 014 WITH NO ANSWER.  OFFICE SPOKE WITH PATIENT ON November 20.014. [place] THAT PATIENT REFUSE ENTRY INTO THE PROGRAM. DUE TO TRANSPORTATION UNABLE TO COMMIT TO PROGRAM.  LELA Mosquero NTII 11-21-014  3;41PM

## 2013-02-25 ENCOUNTER — Encounter: Payer: Self-pay | Admitting: Internal Medicine

## 2013-02-26 ENCOUNTER — Telehealth: Payer: Self-pay | Admitting: *Deleted

## 2013-02-26 ENCOUNTER — Ambulatory Visit
Admission: RE | Admit: 2013-02-26 | Discharge: 2013-02-26 | Disposition: A | Discharge status: Home / Self Care | Payer: Medicare Other | Source: Ambulatory Visit | Attending: Internal Medicine | Admitting: Internal Medicine

## 2013-02-26 DIAGNOSIS — Z0000 Encounter for general adult medical examination without abnormal findings: Secondary | ICD-10-CM

## 2013-02-26 NOTE — Telephone Encounter (Signed)
[facility]ast ctr called states pt came in today for mammo, pt said she was not there for a mammo, tech went to check order and pt left while she was gone. They need to f/ due to past hx

## 2013-02-27 IMAGING — CR DG ANKLE COMPLETE 3+V*R*
3 series · 3 of 3 positions shown · non-contrast
Comparison: None.

CLINICAL DATA: Fall

RIGHT ANKLE - COMPLETE 3+ VIEW

[t ankle joint ap right]
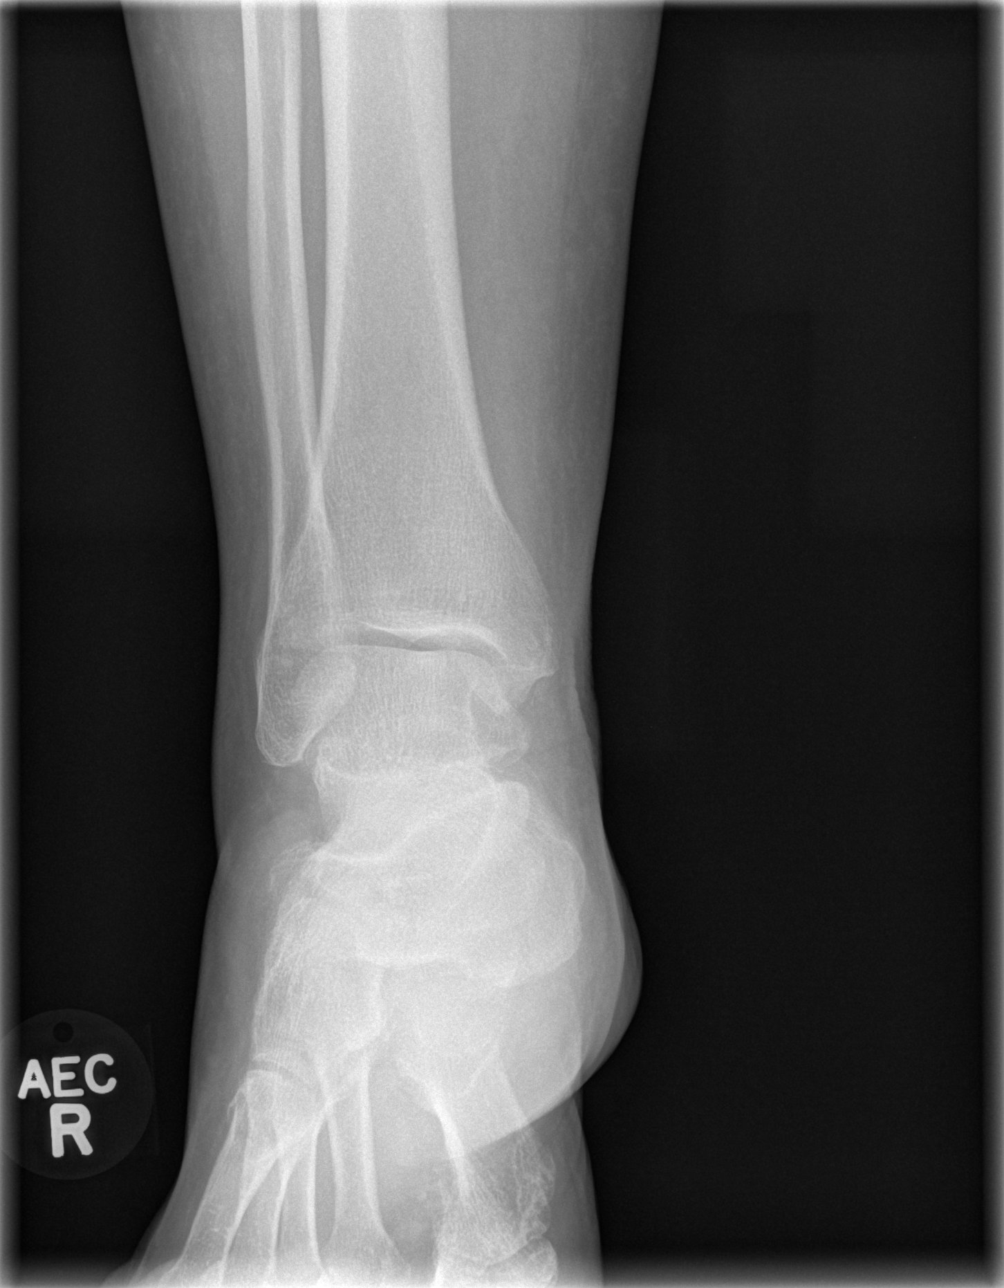

[t ankle joint oblique right]
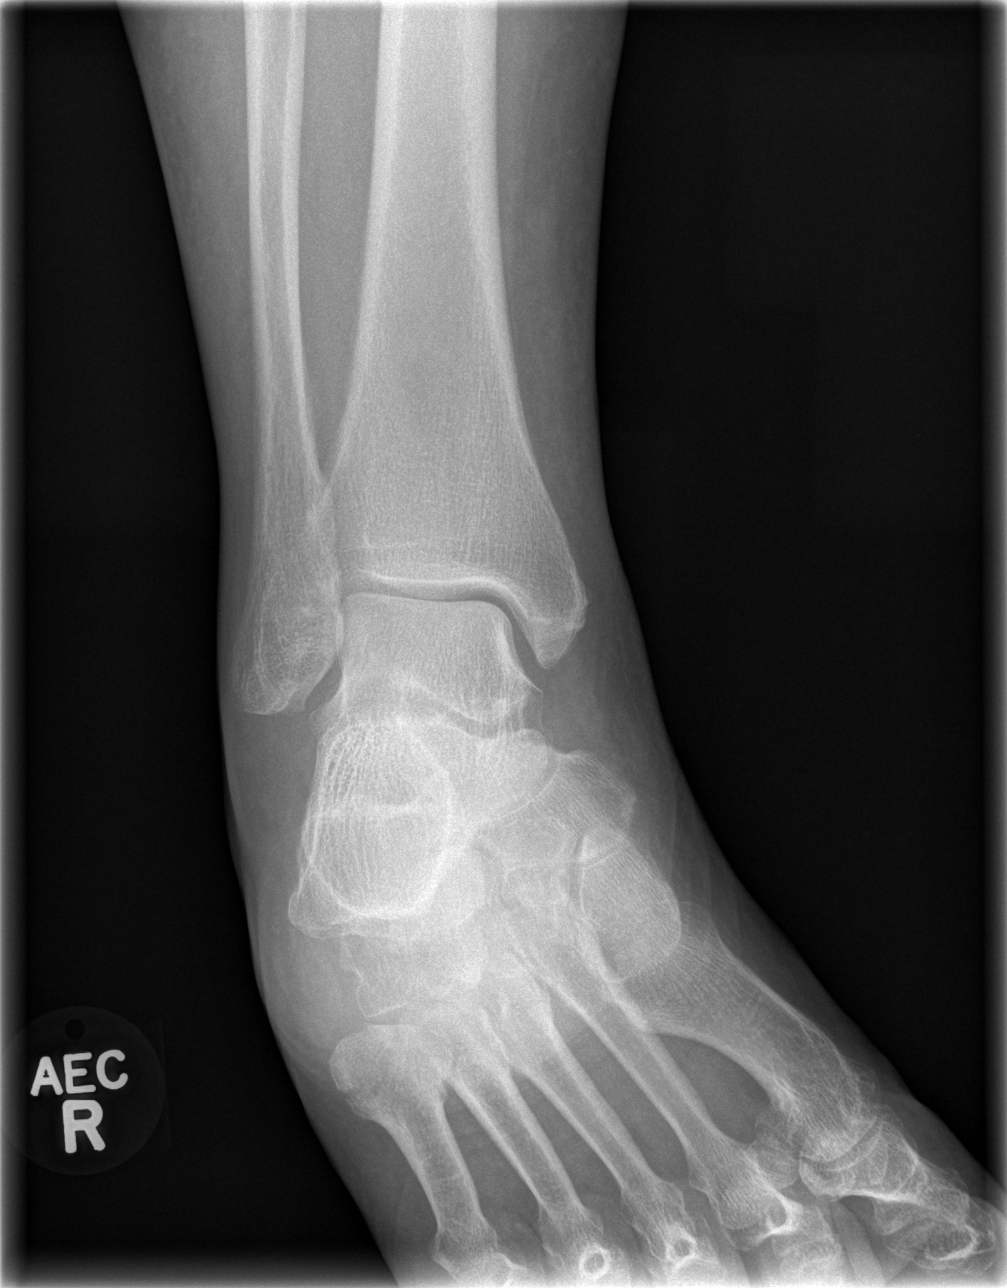

[t ankle joint lat right]
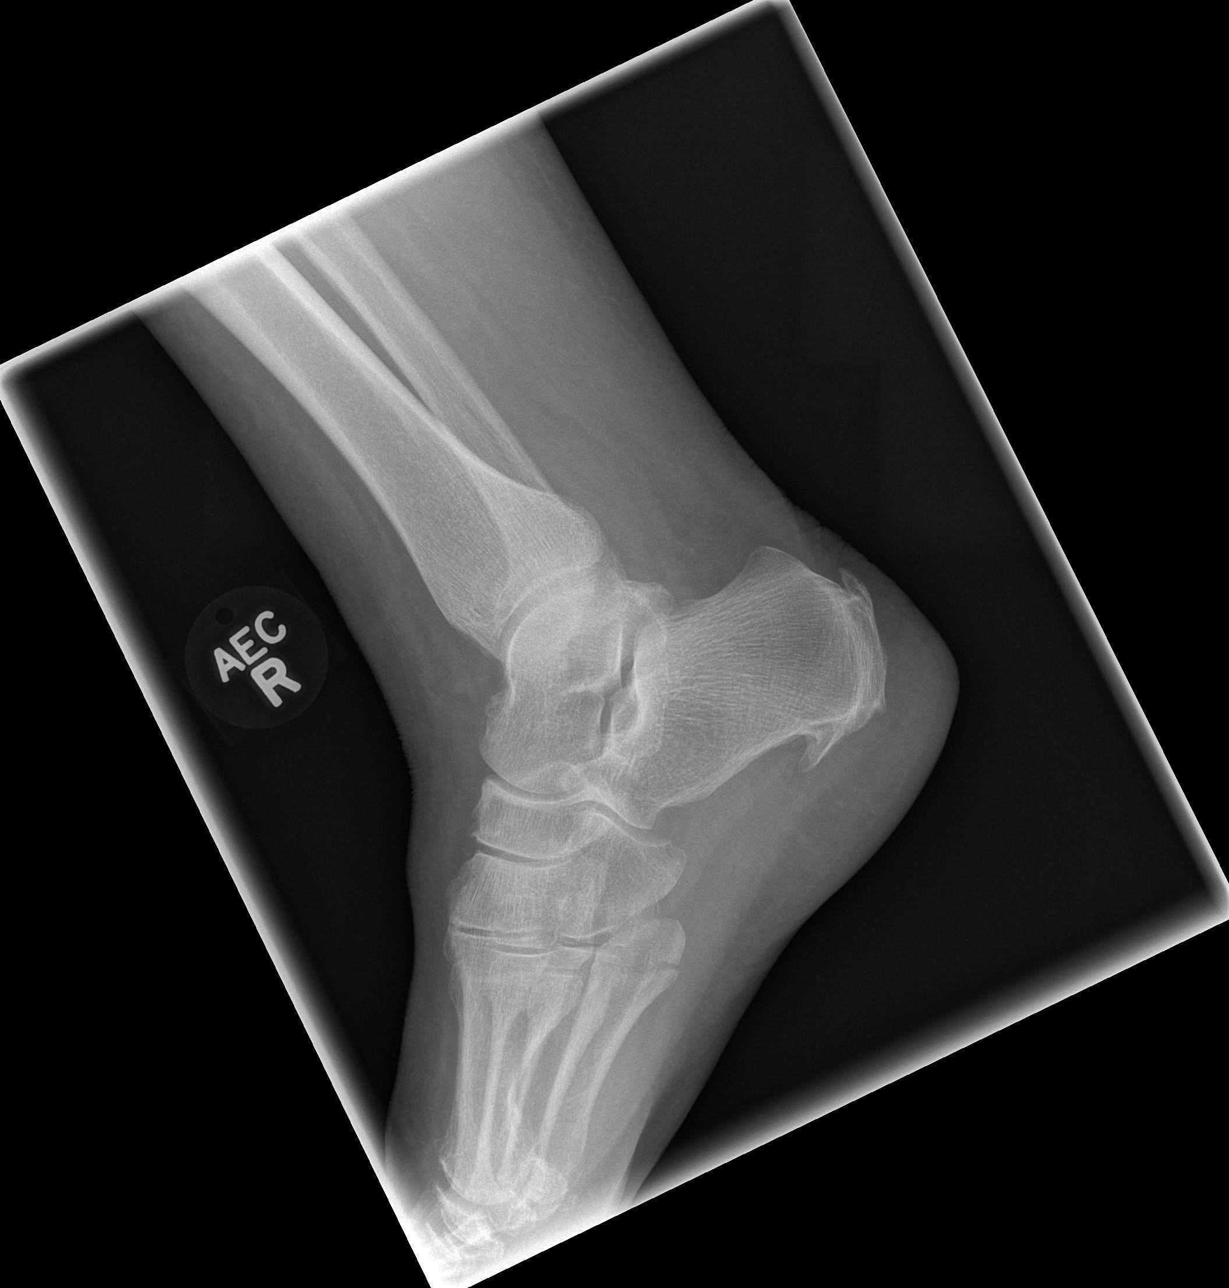

[3 of 3 positions shown; findings below may reference images not displayed]

FINDINGS: No acute fracture and no dislocation about the ankle
joint.  Spurring at the posterior and inferior calcaneus.  There is
a minimally displaced fracture at the base of the fifth metatarsal.
IMPRESSION: Acute fifth metatarsal fracture.

## 2013-02-27 IMAGING — CR DG ANKLE COMPLETE 3+V*L*
3 series · 3 of 3 positions shown · non-contrast
Comparison: None

CLINICAL DATA: Fall with left ankle pain.

LEFT ANKLE COMPLETE - 3+ VIEW

[t ankle joint ap left]
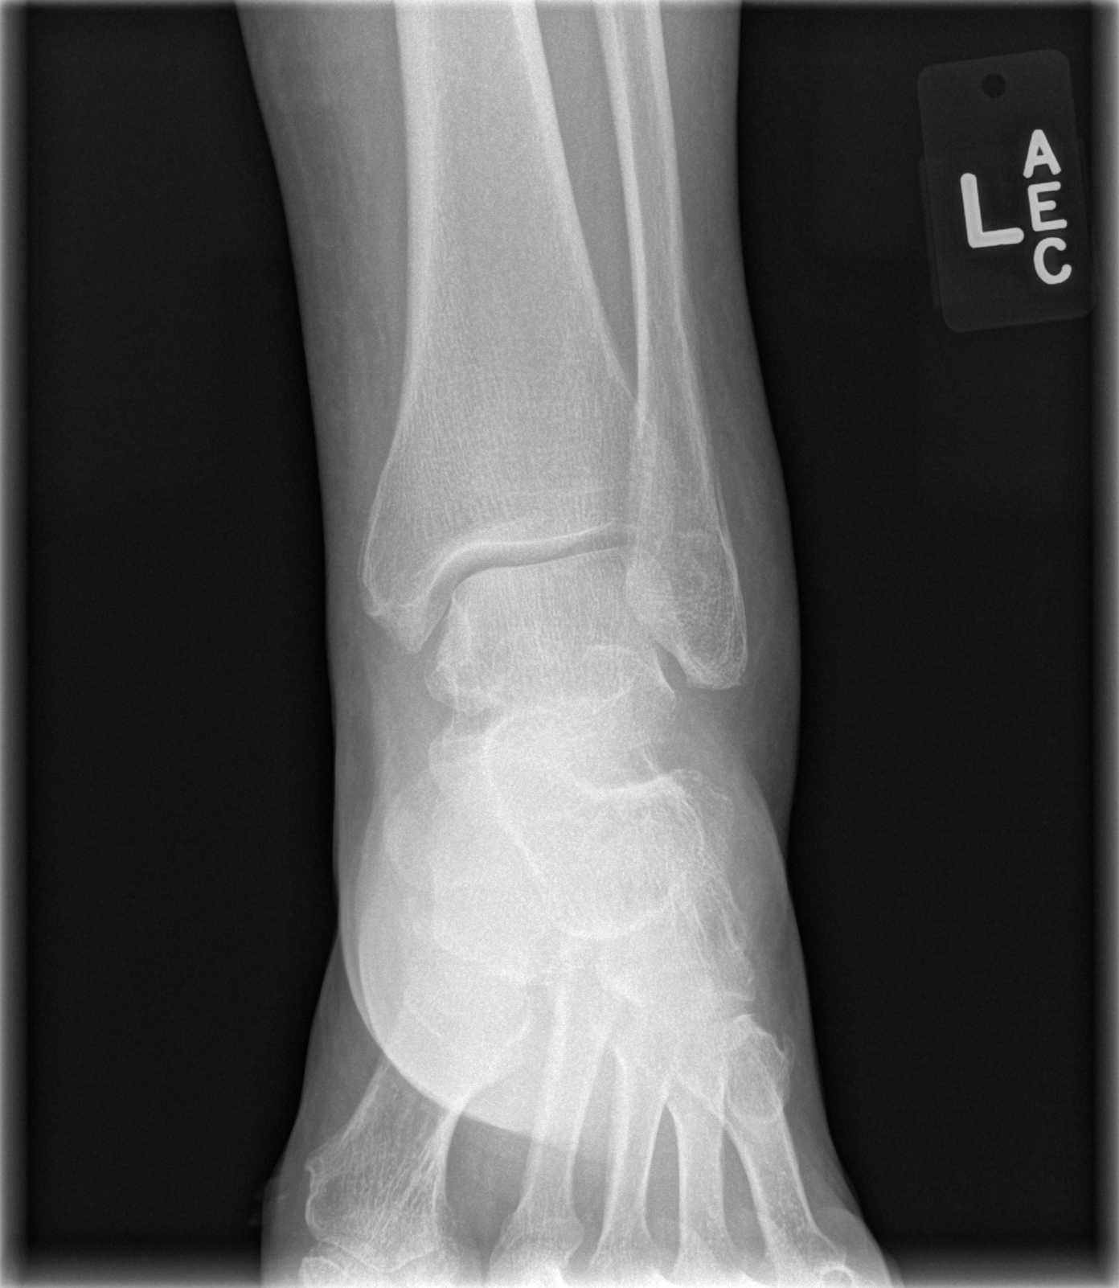

[t ankle joint oblique left]
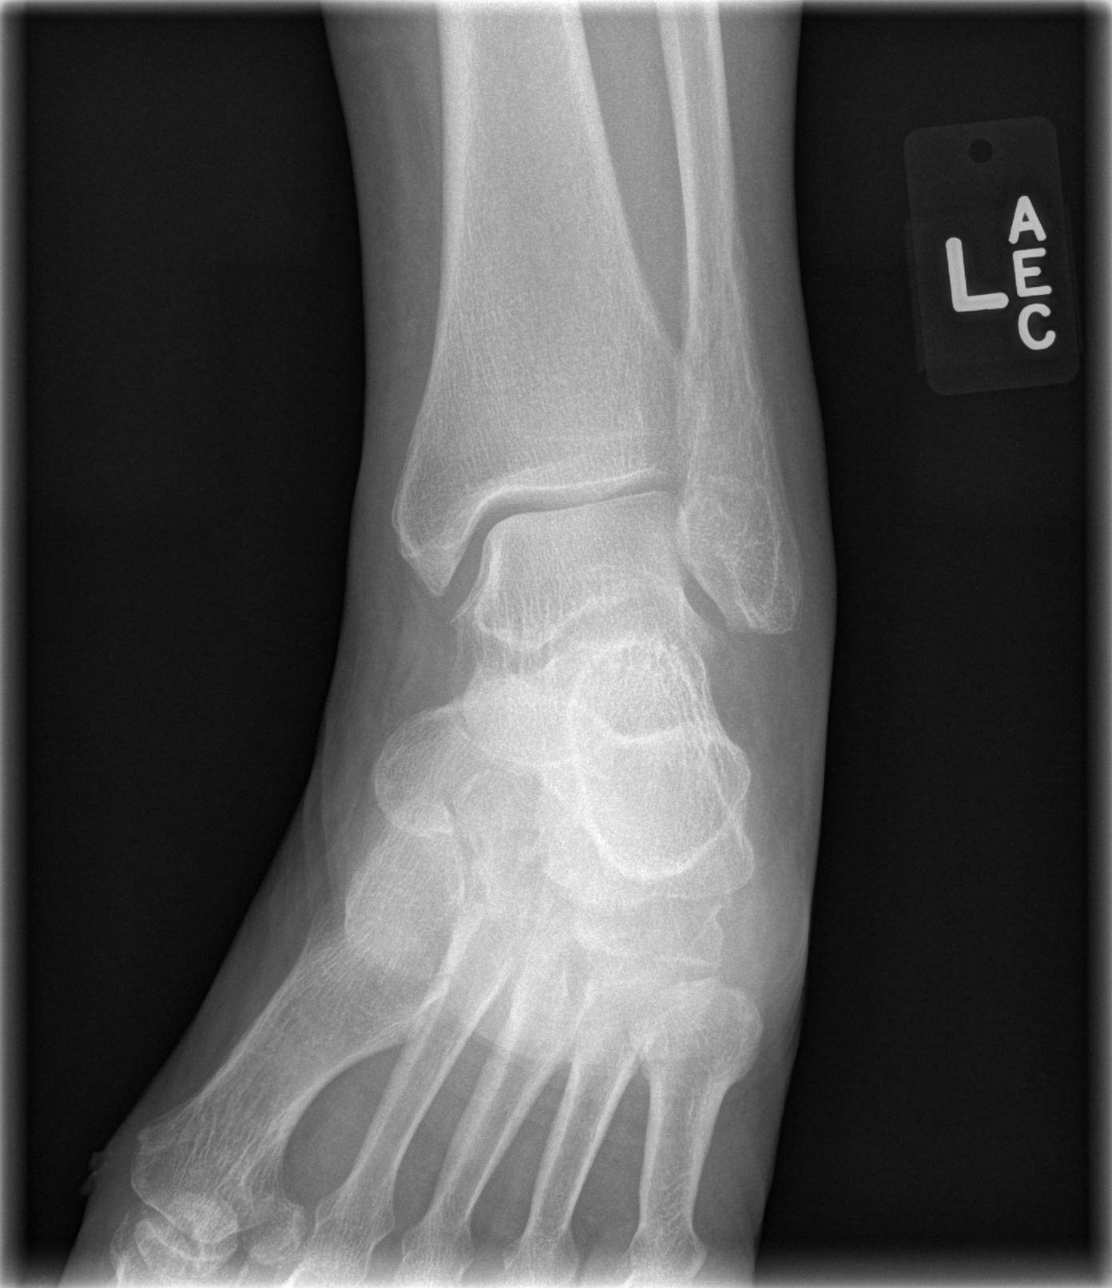

[t ankle joint lat left]
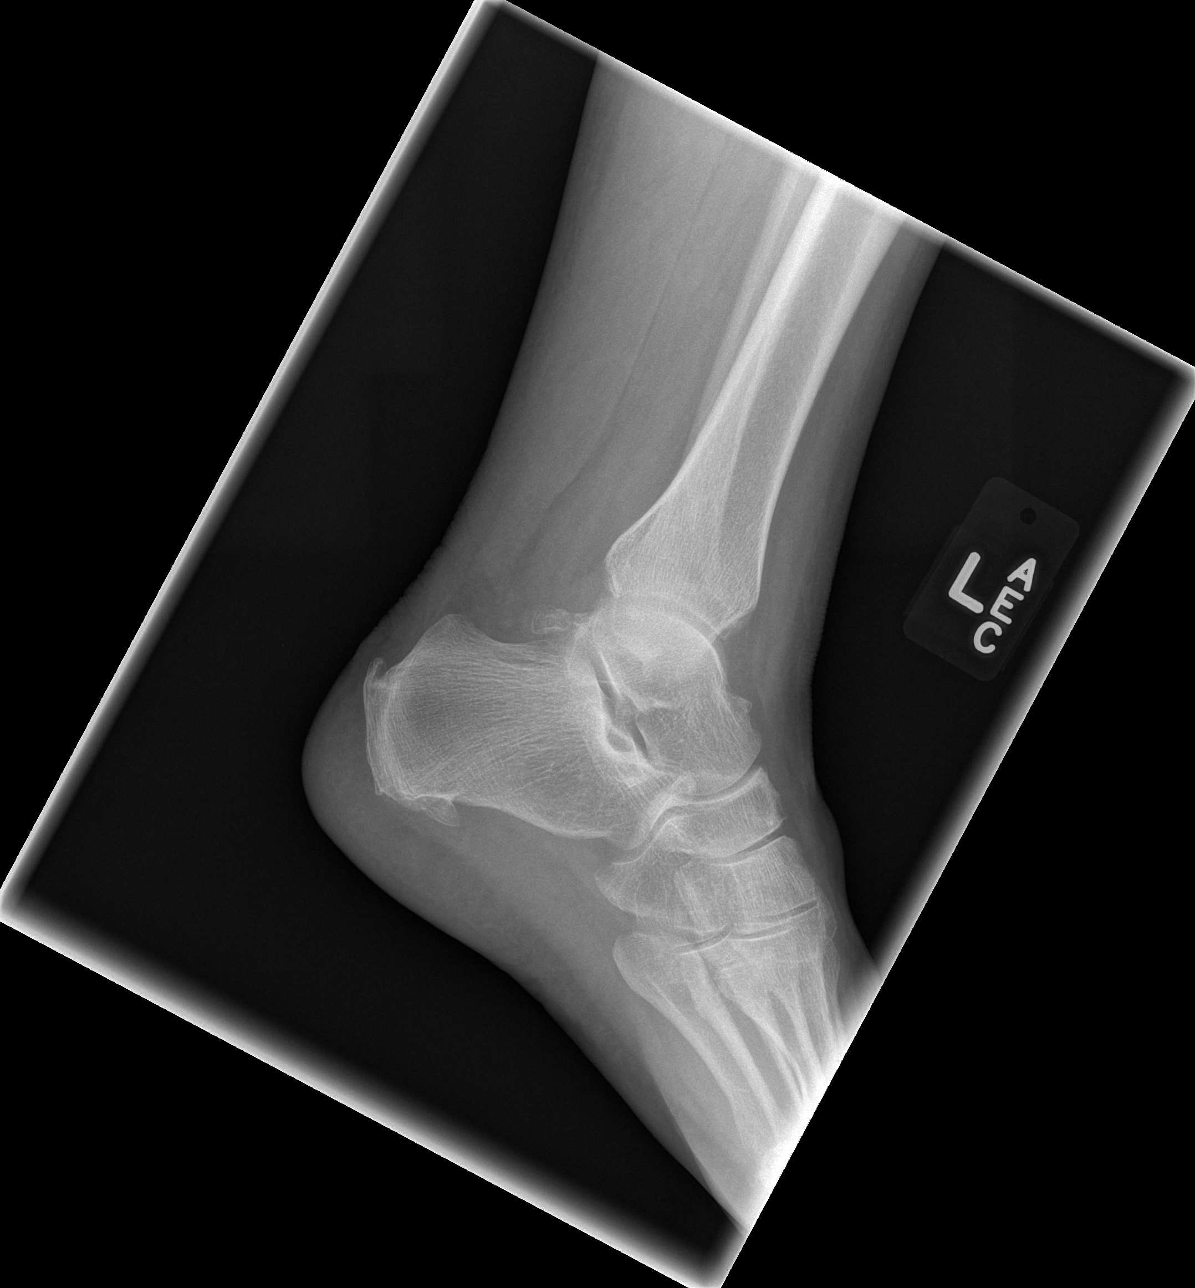

[3 of 3 positions shown; findings below may reference images not displayed]

FINDINGS: Mild periosteal thickening along the distal fibula noted.
There appears to be a subtle fracture line of the distal fibula but
may be subacute given the healing changes.
There is no evidence of sedation or dislocation.
No other fractures are identified.
A calcaneal spur is identified.
IMPRESSION: Nondisplaced distal fibular fracture with mild periosteal reaction
suggesting a subacute nature.

Calcaneal spur.

## 2013-02-27 NOTE — Telephone Encounter (Signed)
Pt's last MMG was 2012 and she required additional images but has never had them. She needs to return to the [facility]AP. It appears that the orders are in EPIC. Let me know if i need to do anything else to get this to happen.
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
## 2013-03-04 NOTE — Telephone Encounter (Signed)
Thanks. Pt has appointment with me on 04/17/2013. I will meet for first time then and discuss this further.

## 2013-03-04 NOTE — Telephone Encounter (Signed)
Thanks for F/U. Will send to PCP

## 2013-03-11 ENCOUNTER — Inpatient Hospital Stay (HOSPITAL_COMMUNITY)
Admission: EM | Admit: 2013-03-11 | Discharge: 2013-03-13 | DRG: 291 | Disposition: A | Discharge status: Home Health | Payer: Medicare Other | Attending: Internal Medicine | Admitting: Internal Medicine

## 2013-03-11 ENCOUNTER — Emergency Department (HOSPITAL_COMMUNITY): Payer: Medicare Other

## 2013-03-11 ENCOUNTER — Encounter (HOSPITAL_COMMUNITY): Payer: Self-pay | Admitting: Emergency Medicine

## 2013-03-11 DIAGNOSIS — Z9981 Dependence on supplemental oxygen: Secondary | ICD-10-CM

## 2013-03-11 DIAGNOSIS — K5900 Constipation, unspecified: Secondary | ICD-10-CM | POA: Diagnosis present

## 2013-03-11 DIAGNOSIS — J441 Chronic obstructive pulmonary disease with (acute) exacerbation: Secondary | ICD-10-CM | POA: Diagnosis present

## 2013-03-11 DIAGNOSIS — Z79899 Other long term (current) drug therapy: Secondary | ICD-10-CM

## 2013-03-11 DIAGNOSIS — E785 Hyperlipidemia, unspecified: Secondary | ICD-10-CM | POA: Diagnosis present

## 2013-03-11 DIAGNOSIS — G4730 Sleep apnea, unspecified: Secondary | ICD-10-CM | POA: Diagnosis present

## 2013-03-11 DIAGNOSIS — I5033 Acute on chronic diastolic (congestive) heart failure: Principal | ICD-10-CM | POA: Diagnosis present

## 2013-03-11 DIAGNOSIS — I2510 Atherosclerotic heart disease of native coronary artery without angina pectoris: Secondary | ICD-10-CM | POA: Diagnosis present

## 2013-03-11 DIAGNOSIS — Z9861 Coronary angioplasty status: Secondary | ICD-10-CM

## 2013-03-11 DIAGNOSIS — J449 Chronic obstructive pulmonary disease, unspecified: Secondary | ICD-10-CM

## 2013-03-11 DIAGNOSIS — E669 Obesity, unspecified: Secondary | ICD-10-CM | POA: Diagnosis present

## 2013-03-11 DIAGNOSIS — D72829 Elevated white blood cell count, unspecified: Secondary | ICD-10-CM | POA: Diagnosis present

## 2013-03-11 DIAGNOSIS — I2789 Other specified pulmonary heart diseases: Secondary | ICD-10-CM | POA: Diagnosis present

## 2013-03-11 DIAGNOSIS — I5030 Unspecified diastolic (congestive) heart failure: Secondary | ICD-10-CM | POA: Diagnosis present

## 2013-03-11 DIAGNOSIS — K219 Gastro-esophageal reflux disease without esophagitis: Secondary | ICD-10-CM | POA: Diagnosis present

## 2013-03-11 DIAGNOSIS — F17200 Nicotine dependence, unspecified, uncomplicated: Secondary | ICD-10-CM | POA: Diagnosis present

## 2013-03-11 DIAGNOSIS — Z91199 Patient's noncompliance with other medical treatment and regimen due to unspecified reason: Secondary | ICD-10-CM

## 2013-03-11 DIAGNOSIS — Z8614 Personal history of Methicillin resistant Staphylococcus aureus infection: Secondary | ICD-10-CM

## 2013-03-11 DIAGNOSIS — Z9119 Patient's noncompliance with other medical treatment and regimen: Secondary | ICD-10-CM

## 2013-03-11 DIAGNOSIS — Z720 Tobacco use: Secondary | ICD-10-CM | POA: Diagnosis present

## 2013-03-11 DIAGNOSIS — I509 Heart failure, unspecified: Secondary | ICD-10-CM | POA: Diagnosis present

## 2013-03-11 DIAGNOSIS — Z7982 Long term (current) use of aspirin: Secondary | ICD-10-CM

## 2013-03-11 DIAGNOSIS — Z6841 Body Mass Index (BMI) 40.0 and over, adult: Secondary | ICD-10-CM

## 2013-03-11 DIAGNOSIS — J9600 Acute respiratory failure, unspecified whether with hypoxia or hypercapnia: Secondary | ICD-10-CM | POA: Diagnosis present

## 2013-03-11 LAB — CBC WITH DIFFERENTIAL/PLATELET
Basophils Absolute: 0 10*3/uL (ref 0.0–0.1)
Eosinophils Absolute: 0.1 10*3/uL (ref 0.0–0.7)
Hemoglobin: 14.3 g/dL (ref 12.0–15.0)
Lymphocytes Relative: 11 % — ABNORMAL LOW (ref 12–46)
Lymphs Abs: 1.4 10*3/uL (ref 0.7–4.0)
MCHC: 31.2 g/dL (ref 30.0–36.0)
MCV: 98.5 fL (ref 78.0–100.0)
Neutro Abs: 10.4 10*3/uL — ABNORMAL HIGH (ref 1.7–7.7)
Platelets: 121 10*3/uL — ABNORMAL LOW (ref 150–400)
RBC: 4.65 MIL/uL (ref 3.87–5.11)
RDW: 13.3 % (ref 11.5–15.5)
WBC: 13.2 10*3/uL — ABNORMAL HIGH (ref 4.0–10.5)

## 2013-03-11 LAB — POCT I-STAT 3, ART BLOOD GAS (G3+)
Acid-Base Excess: 10 mmol/L — ABNORMAL HIGH (ref 0.0–2.0)
Bicarbonate: 40 mEq/L — ABNORMAL HIGH (ref 20.0–24.0)
O2 Saturation: 85 %
TCO2: 42 mmol/L (ref 0–100)
pCO2 arterial: 76.1 mmHg (ref 35.0–45.0)
pO2, Arterial: 60 mmHg — ABNORMAL LOW (ref 80.0–100.0)

## 2013-03-11 LAB — POCT I-STAT TROPONIN I

## 2013-03-11 LAB — COMPREHENSIVE METABOLIC PANEL
ALT: 13 U/L (ref 0–35)
AST: 10 U/L (ref 0–37)
Alkaline Phosphatase: 100 U/L (ref 39–117)
CO2: 40 mEq/L (ref 19–32)
Chloride: 95 mEq/L — ABNORMAL LOW (ref 96–112)
GFR calc Af Amer: >90 mL/min (ref >90)
GFR calc non Af Amer: >90 mL/min (ref >90)
Glucose, Bld: 126 mg/dL — ABNORMAL HIGH (ref 70–99)
Potassium: 3.9 mEq/L (ref 3.5–5.1)
Sodium: 138 mEq/L (ref 135–145)
Total Bilirubin: 0.7 mg/dL (ref 0.3–1.2)

## 2013-03-11 MED ORDER — IPRATROPIUM BROMIDE 0.02 % IN SOLN
1.0000 mg | Freq: Once | RESPIRATORY_TRACT | Status: AC
  Administered 2013-03-11: 1 mg via RESPIRATORY_TRACT
  Filled 2013-03-11: qty 5

## 2013-03-11 MED ORDER — ALBUTEROL (5 MG/ML) CONTINUOUS INHALATION SOLN
15.0000 mg/h | INHALATION_SOLUTION | Freq: Once | RESPIRATORY_TRACT | Status: AC
  Administered 2013-03-11: 15 mg/h via RESPIRATORY_TRACT
  Filled 2013-03-11: qty 20

## 2013-03-11 MED ORDER — ALBUTEROL (5 MG/ML) CONTINUOUS INHALATION SOLN
15.0000 mg/h | INHALATION_SOLUTION | RESPIRATORY_TRACT | Status: DC
  Administered 2013-03-11: 15 mg/h via RESPIRATORY_TRACT

## 2013-03-11 MED ORDER — FUROSEMIDE 10 MG/ML IJ SOLN
40.0000 mg | Freq: Once | INTRAMUSCULAR | Status: AC
  Administered 2013-03-11: 40 mg via INTRAVENOUS
  Filled 2013-03-11: qty 4

## 2013-03-11 NOTE — ED Notes (Signed)
Pt placed on 6L [place] by Respiratory tech and patients O2 decreased to

## 2013-03-11 NOTE — ED Provider Notes (Signed)
CSN: [id number]     Arrival date & time 03/11/13  1935 History   First MD Initiated Contact with Patient 03/11/13 1942     Chief Complaint  Patient presents with  . Shortness of Breath   (Consider location/radiation/quality/duration/timing/severity/associated sxs/prior Treatment) The history is provided by the patient.  [name] is a 57 y.o. female history of COPD, pulmonary hypertension, CAD here presenting with shortness of breath. Shortness of breath the last several days with some cough. Denies fevers or chills. Today has worsening short of breath. She called EMS and was found to be hypoxic 86% on 4 L oxygen. She was placed on BiPAP and was given 125 mg of Solu-Medrol and 1 duoneb.   Past Medical History  Diagnosis Date  . Coronary artery disease     S/P PCI of LAD with DES (12/2008). Total occlusion of RCA noted at that time., medically managed. ACS ruled out 03/2009 with Lexiscan myoview . Followed by [facility].  . Pulmonary hypertension     2-D Echo (83/8184) - Systolic pressure was moderately increased. PA peak pressure  94mHg. 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 Followed by Dr. WJoya Gaskins(Velora Heckler  . Diastolic dysfunction     2-D Echo (12/2008) - Normal LV Systolic funciton with EF 60-65%. Grade 1 diastolid dysfunction. No regional wall motion abnormalities. Moderate pulmonary HTN with PA peak pressure 532mg.  . Marland KitchenOPD (chronic obstructive pulmonary disease)     Severe. Gold Stage IV.  PFTs (12/2008) - severe obstructive airway disease. Active tobacco use. Requires 4L O2 at home.  . Pulmonary nodule, right     Small right middle lobe nodule. Stable as of 12/2008.  . Marland Kitchenleep apnea     Presumptive. No documented sleep studies.   . Prediabetes 12/2008    HgbA1c 6.4 (12/2008)  . Hx MRSA infection     Recurrent MRSA thigh abscesses.  . Tobacco abuse     Ongoing.  .  Obesity   . Hyperlipidemia   . GERD (gastroesophageal reflux disease)     S/P Nissen fundoplication.  . Shortness of breath   . CHF (congestive heart failure)   . HeCRFVOHKG(677.0   Past Surgical History  Procedure Laterality Date  . Total abdominal hysterectomy w/ bilateral salpingoophorectomy    . Nissen fundoplication     Family History  Problem Relation Age of Onset  . Heart disease Mother [id number]  Deceased from MI at 58yo. Hypertension Mother   . Heart disease Father 5425  Deceased of MI age 58yo. Hypertension Father   . Hypertension Brother   . Lung cancer      Grandmother   History  Substance Use Topics  . Smoking status: Current Every Day Smoker -- 0.50 packs/day for 40 years    Types: Cigarettes  . Smokeless tobacco: Never Used  . Alcohol Use: No   OB History   Grav Para Term Preterm Abortions TAB SAB Ect Mult Living                 Review of Systems  Respiratory: Positive for shortness of breath.   All other systems reviewed and are negative.    Allergies  Fluconazole  Home Medications   Current Outpatient Rx  Name  Route  Sig  Dispense  Refill  . albuterol (PROVENTIL HFA;VENTOLIN HFA) 108 (90 BASE) MCG/ACT inhaler  Inhalation   Inhale 1 puff into the lungs every 4 (four) hours as needed for wheezing or shortness of breath.   1 Inhaler   11   . aspirin 81 MG chewable tablet   Oral   Chew 1 tablet (81 mg total) by mouth daily.   30 tablet   11   . cyclobenzaprine (FLEXERIL) 5 MG tablet   Oral   Take 1 tablet (5 mg total) by mouth 3 (three) times daily as needed for muscle spasms.   15 tablet   0   . fluticasone (FLOVENT HFA) 110 MCG/ACT inhaler   Inhalation   Inhale 1 puff into the lungs 2 (two) times daily.         . furosemide (LASIX) 20 MG tablet   Oral   Take 2 tablets (40 mg total) by mouth every morning.   90 tablet   11   . ipratropium (ATROVENT) 0.02 % nebulizer solution   Nebulization   Take 2.5 mLs (0.5 mg total)  by nebulization every 4 (four) hours as needed for wheezing.   75 mL   2   . lovastatin (MEVACOR) 20 MG tablet   Oral   Take 1 tablet (20 mg total) by mouth at bedtime.   30 tablet   11   . mometasone-formoterol (DULERA) 100-5 MCG/ACT AERO   Inhalation   Inhale 2 puffs into the lungs.          BP 145/50  Pulse 101  Temp(Src) 100.1 F (37.8 C) (Oral)  Resp 18  SpO2 91% Physical Exam  Nursing note and vitals reviewed. Constitutional: She is oriented to person, place, and time.  Tachypneic, talking in full sentences   HENT:  Head: Normocephalic.  Mouth/Throat: Oropharynx is clear and moist.  Eyes: Conjunctivae are normal. Pupils are equal, round, and reactive to light.  Neck: Normal range of motion. Neck supple.  Cardiovascular: Normal rate and normal heart sounds.   Pulmonary/Chest:  Tachypneic, diminished breath sounds throughout. + crackles bilateral bases   Abdominal: Soft. Bowel sounds are normal. She exhibits no distension. There is no tenderness. There is no rebound and no guarding.  Musculoskeletal: Normal range of motion.  1+ edema bilaterally   Neurological: She is alert and oriented to person, place, and time.  Skin: Skin is warm and dry.  Psychiatric: She has a normal mood and affect. Her behavior is normal. Judgment and thought content normal.    ED Course  Procedures (including critical care time)  CRITICAL CARE Performed by: Darl Householder, Avontae Burkhead   Total critical care time: 30 min   Critical care time was exclusive of separately billable procedures and treating other patients.  Critical care was necessary to treat or prevent imminent or life-threatening deterioration.  Critical care was time spent personally by me on the following activities: development of treatment plan with patient and/or surrogate as well as nursing, discussions with consultants, evaluation of patient's response to treatment, examination of patient, obtaining history from patient or  surrogate, ordering and performing treatments and interventions, ordering and review of laboratory studies, ordering and review of radiographic studies, pulse oximetry and re-evaluation of patient's condition.   Labs Review Labs Reviewed  CBC WITH DIFFERENTIAL - Abnormal; Notable for the following:    WBC 13.2 (*)    Platelets 121 (*)    Neutrophils Relative % 79 (*)    Neutro Abs 10.4 (*)    Lymphocytes Relative 11 (*)    Monocytes Absolute 1.3 (*)  All other components within normal limits  COMPREHENSIVE METABOLIC PANEL - Abnormal; Notable for the following:    Chloride 95 (*)    CO2 40 (*)    Glucose, Bld 126 (*)    Albumin 3.3 (*)    All other components within normal limits  PRO B NATRIURETIC PEPTIDE - Abnormal; Notable for the following:    Pro B Natriuretic peptide (BNP) 663.4 (*)    All other components within normal limits  POCT I-STAT 3, BLOOD GAS (G3+) - Abnormal; Notable for the following:    pH, Arterial 7.332 (*)    pCO2 arterial 76.1 (*)    pO2, Arterial 60.0 (*)    Bicarbonate 40.0 (*)    Acid-Base Excess 10.0 (*)    All other components within normal limits  BLOOD GAS, ARTERIAL  POCT I-STAT TROPONIN I   Imaging Review Dg Chest Portable 1 View  03/11/2013   CLINICAL DATA:  Short of breath.  COPD.  EXAM: PORTABLE CHEST - 1 VIEW  COMPARISON:  01/09/2013.  FINDINGS: Cardiomegaly. Perihilar and basilar predominant airspace disease is present compatible with CHF and pulmonary edema. Monitoring leads project over the chest. Basilar atelectasis.  IMPRESSION: Moderate CHF.   Electronically Signed   By: Dereck Ligas M.D.   On: 03/11/2013 20:17    EKG Interpretation    Date/Time:  Monday March 11 2013 19:43:51 EST Ventricular Rate:  86 PR Interval:  135 QRS Duration: 91 QT Interval:  364 QTC Calculation: 435 R Axis:   102 Text Interpretation:  Sinus rhythm Consider right ventricular hypertrophy No significant change since last tracing Confirmed by Riyad Keena  MD,  Chenay Nesmith [id number]) on 03/11/2013 7:50:50 PM            MDM  No diagnosis found. Arliss Hepburn is a 57 y.o. female here with SOB. Likely COPD vs CHF. I titrated her off bipap. Will get ABG. Will likely admit.   9:53 PM Labs showed CO2 40. ABG showed CO2 77. However, she is compensated. I offered her bipap, but she refused. Will admit to internal medicine on step down. Ordered second continuous neb.    Wandra Arthurs, MD 03/11/13 2154

## 2013-03-11 NOTE — H&P (Signed)
Date: 03/11/2013               Patient Name:  [name] MRN: [id number]  DOB: [birth year] Age / Sex: 57 y.o., female   PCP: Jessee Avers, MD         Medical Service: Internal Medicine Teaching Service         Attending physician: Dr. Lynnae January  Internal Medicine Teaching Service Contact Information  Weekday Hours (7AM-5PM):  1st Contact: Dr. Bing Neighbors      Pager: [phone] 2nd Contact: Dr. Jessee Avers      Pager:[phone]  ** If no return call within 15 minutes (after trying both pagers listed above), please call after hours pagers.  After 5 pm or weekends: 1st Contact: Pager: [phone] 2nd Contact: Pager: [phone]   Chief Complaint: SOB, chest tightness  History of Present Illness: [name] is a 57yo WF w/ PMH obesity, HL, GERD, CAD s/p DES to LAD (2010), mild-moderate pulmonary HTN (R heart cath 11/2012), stage IV COPD on 4L home oxygen (last PFTs 2010), dCHF (grade I diastolic dysfunction w/ EF 60-65%) and tobacco abuse who presents with worsening SOB and chest tightness x 2 days.   Pt reports she was in her normal state of health until two days ago when she noticed her baseline SOB worsening. Today when she was sitting on the couch her SOB gradually worsened further, causing her to call EMS. Patient also has constant, worsening chest tightness over the past two days. She has a productive cough at baseline and denies that her cough has changed in nature recently, sputum is clear in color and no recent increase in amount of sputum produced. Patient is supposed to be on albuterol inhaler prn, albuterol/atrovent neb prn, flovent, dulera and lasix 76m daily. Patient ran out of her albuterol inhaler three days ago and just got it refilled yesterday. She took her albuterol inhaler twice yesterday and only once today. She has not been taking her nebulizer treatments because she "forgets she has them," nor has she been taking the dulera 2/2 financial constraints. She does take  flovent. She takes only half of the prescribed dose of lasix given urinary frequency interferes w/ her daily activities if she takes the full dose. She denies F/C, N/V/D, recent URI symptoms, pain in calves, leg swelling, dysuria, hematuria. She does endorse having a chronic issue w/ constipation that causes her some abd pain, but no change recently. Of note, patient is disabled and does not work. She lives w/ her grandson and a roommate who help care for her when necessary. No recent travel, but she does lead a rather sedentary lifestyle.   Patient was originally put on bipap by EMS for O2 sat 86% on 4LPM O2. Per EMS, she also received 1 duoneb as well as solumedrol 1243mby EMS. In the ED, T 100.1, BP 145/50, HR 101, RR 18, satting 91% on 5L via nonrebreather mask. CMP: bicarb 40 (approx baseline), Cr .62. CBC: WBC 13.2, PLT 121. Pro-BNP 663. POC Trop neg x 1. ABG: pH 7.3, pCO2 76.1, pO2 60, bicarb 40. CXR showed pulmonary edema c/w CHF. EKG showed NSR w/o change from prior.  When she arrived in ED bipap was removed at she was placed on nonrebreather and given continuous albuterol neb w/ atrovent x 1 dose, as well as lasix 4083mV x 1 dose.  Meds: Current Facility-Administered Medications  Medication Dose Route Frequency Provider Last Rate Last Dose  . albuterol (PROVENTIL,VENTOLIN) solution continuous neb  15 mg/hr Nebulization Continuous Wandra Arthurs, MD 3 mL/hr at 03/11/13 2114 15 mg/hr at 03/11/13 2114   Current Outpatient Prescriptions  Medication Sig Dispense Refill  . albuterol (PROVENTIL HFA;VENTOLIN HFA) 108 (90 BASE) MCG/ACT inhaler Inhale 1 puff into the lungs every 4 (four) hours as needed for wheezing or shortness of breath.  1 Inhaler  11  . aspirin 81 MG chewable tablet Chew 1 tablet (81 mg total) by mouth daily.  30 tablet  11  . cyclobenzaprine (FLEXERIL) 5 MG tablet Take 1 tablet (5 mg total) by mouth 3 (three) times daily as needed for muscle spasms.  15 tablet  0  . fluticasone  (FLOVENT HFA) 110 MCG/ACT inhaler Inhale 1 puff into the lungs 2 (two) times daily.      . furosemide (LASIX) 20 MG tablet Take 2 tablets (40 mg total) by mouth every morning.  90 tablet  11  . ipratropium (ATROVENT) 0.02 % nebulizer solution Take 2.5 mLs (0.5 mg total) by nebulization every 4 (four) hours as needed for wheezing.  75 mL  2  . lovastatin (MEVACOR) 20 MG tablet Take 1 tablet (20 mg total) by mouth at bedtime.  30 tablet  11  . mometasone-formoterol (DULERA) 100-5 MCG/ACT AERO Inhale 2 puffs into the lungs.        Allergies: Allergies as of 03/11/2013 - Review Complete 03/11/2013  Allergen Reaction Noted  . Fluconazole Anaphylaxis    Past Medical History  Diagnosis Date  . Coronary artery disease     S/P PCI of LAD with DES (12/2008). Total occlusion of RCA noted at that time., medically managed. ACS ruled out 03/2009 with Lexiscan myoview . Followed by [facility].  . Pulmonary hypertension     2-D Echo (62/8366) - Systolic pressure was moderately increased. PA peak pressure  77mHg. 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 Followed by Dr. WJoya Gaskins(Velora Heckler  . Diastolic dysfunction     2-D Echo (12/2008) - Normal LV Systolic funciton with EF 60-65%. Grade 1 diastolid dysfunction. No regional wall motion abnormalities. Moderate pulmonary HTN with PA peak pressure 514mg.  . Marland KitchenOPD (chronic obstructive pulmonary disease)     Severe. Gold Stage IV.  PFTs (12/2008) - severe obstructive airway disease. Active tobacco use. Requires 4L O2 at home.  . Pulmonary nodule, right     Small right middle lobe nodule. Stable as of 12/2008.  . Marland Kitchenleep apnea     Presumptive. No documented sleep studies.   . Prediabetes 12/2008    HgbA1c 6.4 (12/2008)  . Hx MRSA infection     Recurrent MRSA thigh abscesses.  . Tobacco abuse     Ongoing.  . Obesity   . Hyperlipidemia   . GERD  (gastroesophageal reflux disease)     S/P Nissen fundoplication.  . Shortness of breath   . CHF (congestive heart failure)   . HeQHUTMLYY(503.5   Past Surgical History  Procedure Laterality Date  . Total abdominal hysterectomy w/ bilateral salpingoophorectomy    . Nissen fundoplication     Family History  Problem Relation Age of Onset  . Heart disease Mother 474  Deceased from MI at 58yo. Hypertension Mother   . Heart disease Father 5473  Deceased of MI age 58yo. Hypertension Father   . Hypertension Brother   . Lung cancer      Grandmother  History   Social History  . Marital Status: Single    Spouse Name: N/A    Number of Children: N/A  . Years of Education: N/A   Occupational History  . Not on file.   Social History Main Topics  . Smoking status: Current Every Day Smoker --1ppd for 40 years    Types: Cigarettes  . Smokeless tobacco: Never Used  . Alcohol Use: No  . Drug Use: No- cocaine x 2 years "many years ago"- no use recently  . Sexual Activity: Not on file   Other Topics Concern  . Not on file   Social History Narrative   Formerly worked as a Scientist, water quality, now disabled.   Divorced.   2 grown children.   Lives with her grandson as well as a roommate   Review of Systems: General: no fevers, chills, changes in weight, changes in appetite Skin: no rash HEENT: no HA, sore throat Pulm: see HPI CV: see HPI Abd: +chronic abd pain 2/2 constipation, no N/V/D GU: no dysuria, hematuria Ext: no arthralgias, myalgias Neuro: no weakness, numbness, or tingling  Physical Exam: Blood pressure 93/49, pulse 109, temperature 100.1 F (37.8 C), temperature source Oral, resp. rate 21, SpO2 90.00%. on nonrebreather mask @ 5LPM O2  General: alert, cooperative, and in no apparent distress; nonrebreather mask in place HEENT: pupils equal round and reactive to light, vision grossly intact, oropharynx clear and non-erythematous; MMM Neck: supple, no JVD or carotid  bruits Lungs: decreased air movement bilaterally with bibasilar inspiratory crackles, mildly increased WOB, no wheezes or rhonchi Heart: tachycardic to low 100s, normal rhythm; no murmurs, gallops, or rubs Abdomen: obese abdomen; soft, non-tender, non-distended, normal bowel sounds Extremities: warm extremities bilaterally, trace BLE edema; no palpable cords or calf tenderness; no erythema to BLE Neurologic: alert & oriented X3, cranial nerves II-XII grossly intact, strength grossly intact, sensation intact to light touch  Lab results: Basic Metabolic Panel:  Recent Labs  03/11/13 1948  NA 138  K 3.9  CL 95*  CO2 40*  GLUCOSE 126*  BUN 7  CREATININE 0.62  CALCIUM 9.1   Liver Function Tests:  Recent Labs  03/11/13 1948  AST 10  ALT 13  ALKPHOS 100  BILITOT 0.7  PROT 6.5  ALBUMIN 3.3*   CBC:  Recent Labs  03/11/13 1948  WBC 13.2*  NEUTROABS 10.4*  HGB 14.3  HCT 45.8  MCV 98.5  PLT 121*   Cardiac Enzymes: No results found for this basename: CKTOTAL, CKMB, CKMBINDEX, TROPONINI,  in the last 72 hours BNP:  Recent Labs  03/11/13 1948  PROBNP 663.4*   MISC: ABG: pH 7.3, pCO2 76.1, pO2 60, bicarb 40  Imaging results:  Dg Chest Portable 1 View  03/11/2013   CLINICAL DATA:  Short of breath.  COPD.  EXAM: PORTABLE CHEST - 1 VIEW  COMPARISON:  01/09/2013.  FINDINGS: Cardiomegaly. Perihilar and basilar predominant airspace disease is present compatible with CHF and pulmonary edema. Monitoring leads project over the chest. Basilar atelectasis.  IMPRESSION: Moderate CHF.   Electronically Signed   By: Dereck Ligas M.D.   On: 03/11/2013 20:17   Other results: EKG: NSR, normal axis and intervals, RSR' in leads aVR and V1 c/w R heart strain, as well as biphasic P wave in lead V1 c/w L atrial enlargement. No ischemic changes.   Assessment & Plan by Problem:  # Shortness of breath: Patient's shortness of breath most likely represents a combination of COPD and CHF  exacerbation. She has  gold stage IV COPD and she has not been compliant to her home medications. She was supposed to take albuterol, Flovent, and Dulera inhalers, and albuterol/Atrovent nebulizers at home, but she is only taking albuterol and Flovent inhalers at home. She continues to smoke one pack a day. Patient has hx of grade 2 diastolic dysfunction by echo in 2010. She also has mild to moderate pulmonary hypertension as pulmonary artery pressure 36 as per R heart cath 11/2012. She supposed to take Lasix 40 mg daily, but she is only taking 20 mg daily. Although her proBNP is not significant elevated today (at 663), her physical exam and chest x-ray are consistent with pulmonary edema 2/2 CHF. Another important differential diagnosis is pulmonary embolism, which is unlikely given no signs of DVT, chest tightness is chronic and not pleuritic, no recent long distance traveling. Her Well's score is low probability (1.5). PNA is a possibility given leukocytosis and mild fever, though this did not appear on CXR and her leukocytosis is most likely due to Solu-Medrol treatment by EMS.  - Admit to tele - nebulizers: Xopenex and Atrovent every 6 hours - Albuterol nebulizers when necessary every 2 hours - Oral prednisone 50 mg daily - Robitussen prn for cough - O2 to keep O2 90-92% as she chronically retains CO2 (Avoid O2 toxicity) - incentive spirometry - start Z-Pak - Lasix 40 mg IV X 1. - will get 2 view CXR in AM after better diuresis.  - follow BMP for electrolytes - blood culture X 2 given fever  # Hx of CAD: Stable. The patient does have chest tightness that is not anginal in nature and is chronic. There are no ischemic changes on EKG and poc troponin negative.  - continue home aspirin 81 mg daily. - check trop X 1  # Hyperlipidemia: LDL was 136 on 11/09/12. Patient was on lovastatin 20 mg daily at home. -will switch to simvastatin 10 mg daily while in hospital  # Tobacco dependence  - nicotine  patch 66m -discussed quitting smoking  #DVT PPX: Sq lovenox  Dispo: Disposition is deferred at this time, awaiting improvement of current medical problems. Anticipated discharge in approximately 2 day(s).   The patient does have a current PCP (Jessee Avers MD) and does [facility].  The patient does not have transportation limitations that hinder transportation to clinic appointments.  Signed: RRebecca Eaton MD 03/11/2013, 10:31 PM

## 2013-03-11 NOTE — ED Notes (Signed)
Dr. Darl Householder at bedside. Respiratory at bedside.

## 2013-03-11 NOTE — ED Notes (Signed)
Patient called ems for SOB. Upon arrival found to have O2 saturations @ 86% using home O2 @ 4L via [place]. EMS gave 125 Solu-medrol and 5/.5 of Albuterol and Atrovent. Patient arrived on C-Pap with saturations maintaining at 89-90%.

## 2013-03-11 NOTE — ED Notes (Signed)
Dr. Darl Householder made aware of pts critical CO2 of 40.

## 2013-03-11 NOTE — Progress Notes (Signed)
Dr. Darl Householder made aware of critical values on pt ABG. CO2 76.1

## 2013-03-11 NOTE — ED Notes (Signed)
Pt O2 saturation at 86% with 6L [place]. Respiratory paged. Will try venturi mask.

## 2013-03-12 ENCOUNTER — Inpatient Hospital Stay (HOSPITAL_COMMUNITY): Payer: Medicare Other

## 2013-03-12 LAB — CBC
HCT: 44.5 % (ref 36.0–46.0)
Hemoglobin: 13.7 g/dL (ref 12.0–15.0)
MCV: 98.5 fL (ref 78.0–100.0)
Platelets: 125 10*3/uL — ABNORMAL LOW (ref 150–400)
RBC: 4.52 MIL/uL (ref 3.87–5.11)
RDW: 13.3 % (ref 11.5–15.5)
WBC: 12 10*3/uL — ABNORMAL HIGH (ref 4.0–10.5)

## 2013-03-12 LAB — BASIC METABOLIC PANEL
BUN: 10 mg/dL (ref 6–23)
CO2: 34 mEq/L — ABNORMAL HIGH (ref 19–32)
CO2: 37 mEq/L — ABNORMAL HIGH (ref 19–32)
Chloride: 90 mEq/L — ABNORMAL LOW (ref 96–112)
Chloride: 96 mEq/L (ref 96–112)
Creatinine, Ser: 0.83 mg/dL (ref 0.50–1.10)
Creatinine, Ser: 1.06 mg/dL (ref 0.50–1.10)
GFR calc non Af Amer: 76 mL/min — ABNORMAL LOW (ref >90)
Glucose, Bld: 405 mg/dL — ABNORMAL HIGH (ref 70–99)
Glucose, Bld: 205 mg/dL — ABNORMAL HIGH (ref 70–99)
Sodium: 138 mEq/L (ref 135–145)

## 2013-03-12 LAB — TROPONIN I: Troponin I: <0.3 ng/mL (ref <0.30)

## 2013-03-12 MED ORDER — NICOTINE 21 MG/24HR TD PT24
21.0000 mg | MEDICATED_PATCH | Freq: Every day | TRANSDERMAL | Status: DC
  Administered 2013-03-12 – 2013-03-13 (×2): 21 mg via TRANSDERMAL
  Filled 2013-03-12 (×2): qty 1

## 2013-03-12 MED ORDER — LEVALBUTEROL HCL 0.63 MG/3ML IN NEBU
0.6300 mg | INHALATION_SOLUTION | Freq: Four times a day (QID) | RESPIRATORY_TRACT | Status: DC
  Administered 2013-03-12 – 2013-03-13 (×6): 0.63 mg via RESPIRATORY_TRACT
  Filled 2013-03-12 (×14): qty 3

## 2013-03-12 MED ORDER — SODIUM CHLORIDE 0.9 % IV SOLN: 250.0000 mL | INTRAVENOUS | Status: DC | PRN

## 2013-03-12 MED ORDER — AZITHROMYCIN 250 MG PO TABS
250.0000 mg | ORAL_TABLET | Freq: Every day | ORAL | Status: DC
  Administered 2013-03-13: 250 mg via ORAL
  Filled 2013-03-12: qty 1

## 2013-03-12 MED ORDER — CYCLOBENZAPRINE HCL 10 MG PO TABS: 5.0000 mg | ORAL_TABLET | Freq: Three times a day (TID) | ORAL | Status: DC | PRN

## 2013-03-12 MED ORDER — ASPIRIN 81 MG PO CHEW
81.0000 mg | CHEWABLE_TABLET | Freq: Every day | ORAL | Status: DC
  Administered 2013-03-12 – 2013-03-13 (×2): 81 mg via ORAL
  Filled 2013-03-12 (×2): qty 1

## 2013-03-12 MED ORDER — SODIUM CHLORIDE 0.9 % IJ SOLN
3.0000 mL | Freq: Two times a day (BID) | INTRAMUSCULAR | Status: DC
  Administered 2013-03-12: 3 mL via INTRAVENOUS

## 2013-03-12 MED ORDER — GUAIFENESIN 100 MG/5ML PO SYRP
200.0000 mg | ORAL_SOLUTION | ORAL | Status: DC | PRN
  Filled 2013-03-12: qty 10

## 2013-03-12 MED ORDER — FUROSEMIDE 40 MG PO TABS
40.0000 mg | ORAL_TABLET | Freq: Every day | ORAL | Status: DC
  Administered 2013-03-13: 10:00:00 40 mg via ORAL
  Filled 2013-03-12 (×2): qty 1

## 2013-03-12 MED ORDER — POTASSIUM CHLORIDE CRYS ER 20 MEQ PO TBCR
40.0000 meq | EXTENDED_RELEASE_TABLET | ORAL | Status: AC
  Administered 2013-03-12 (×2): 40 meq via ORAL
  Filled 2013-03-12 (×2): qty 2

## 2013-03-12 MED ORDER — SODIUM CHLORIDE 0.9 % IJ SOLN
3.0000 mL | Freq: Two times a day (BID) | INTRAMUSCULAR | Status: DC
  Administered 2013-03-12 – 2013-03-13 (×3): 3 mL via INTRAVENOUS

## 2013-03-12 MED ORDER — AZITHROMYCIN 500 MG PO TABS
500.0000 mg | ORAL_TABLET | Freq: Every day | ORAL | Status: AC
  Administered 2013-03-12: 13:00:00 500 mg via ORAL
  Filled 2013-03-12: qty 1

## 2013-03-12 MED ORDER — ENOXAPARIN SODIUM 60 MG/0.6ML ~~LOC~~ SOLN
55.0000 mg | SUBCUTANEOUS | Status: DC
  Administered 2013-03-12 – 2013-03-13 (×2): 55 mg via SUBCUTANEOUS
  Filled 2013-03-12 (×2): qty 0.6

## 2013-03-12 MED ORDER — IPRATROPIUM BROMIDE 0.02 % IN SOLN
0.5000 mg | Freq: Four times a day (QID) | RESPIRATORY_TRACT | Status: DC
  Administered 2013-03-12 – 2013-03-13 (×6): 0.5 mg via RESPIRATORY_TRACT
  Filled 2013-03-12 (×6): qty 2.5

## 2013-03-12 MED ORDER — PREDNISONE 50 MG PO TABS
50.0000 mg | ORAL_TABLET | Freq: Every day | ORAL | Status: DC
  Administered 2013-03-12 – 2013-03-13 (×2): 50 mg via ORAL
  Filled 2013-03-12 (×3): qty 1

## 2013-03-12 MED ORDER — ALBUTEROL SULFATE (5 MG/ML) 0.5% IN NEBU: 2.5000 mg | INHALATION_SOLUTION | RESPIRATORY_TRACT | Status: DC | PRN

## 2013-03-12 MED ORDER — SODIUM CHLORIDE 0.9 % IJ SOLN: 3.0000 mL | INTRAMUSCULAR | Status: DC | PRN

## 2013-03-12 MED ORDER — FUROSEMIDE 10 MG/ML IJ SOLN
40.0000 mg | Freq: Once | INTRAMUSCULAR | Status: AC
  Administered 2013-03-12: 01:00:00 40 mg via INTRAVENOUS
  Filled 2013-03-12: qty 4

## 2013-03-12 NOTE — Progress Notes (Signed)
UR completed. Lilla Callejo RN CCM Case Mgmt phone [phone] 

## 2013-03-12 NOTE — Progress Notes (Signed)
Patient refused Lasix and was not accepting of heart failure teaching

## 2013-03-12 NOTE — Care Management Note (Signed)
Page 1 of 1   03/14/2013     11:30:39 AM   CARE MANAGEMENT NOTE 03/14/2013  Patient:  [name], [name]   Account Number:  [id number]  Date Initiated:  03/12/2013  Documentation initiated by:  [facility]  Subjective/Objective Assessment:   COPD, hypoxia     Action/Plan:   Anticipated DC Date:  03/13/2013   Anticipated DC Plan:  [place]  CM consult      Choice offered to / List presented to:             Status of service:  Completed, signed off Medicare Important Message given?   (If response is "NO", the following Medicare IM given date fields will be blank) Date Medicare IM given:   Date Additional Medicare IM given:    Discharge Disposition:  HOME/SELF CARE  Per UR Regulation:  Reviewed for med. necessity/level of care/duration of stay  If discussed at [place] of Stay Meetings, dates discussed:    Comments:  03/14/13 11:29 Tomi Bamberger RN, BSN [phone] patient dc to home, no NCM referral, no needs anticipated.  03/12/13 16:29 Tomi Bamberger RN, BSN [phone] patient lives with grandson, patient on NRB face mask at 31 L, NCM will continue to follow for dc needs.

## 2013-03-12 NOTE — Progress Notes (Signed)
Inpatient Diabetes Program Recommendations  AACE/ADA: New Consensus Statement on Inpatient Glycemic Control (2013)  Target Ranges:  Prepandial:   less than 140 mg/dL      Peak postprandial:   less than 180 mg/dL (1-2 hours)      Critically ill patients:  140 - 180 mg/dL   Reason for Visit: Hyperglycemia  Results for HAJAR, PENNINGER (MRN [id number]) as of 03/12/2013 11:29  Ref. Range 03/12/2013 01:15  Glucose Latest Range: 70-99 mg/dL 405 (H)   On Prednisone 49m QD.  Hx of "pre-DM" (05/12/2010)  **Consider addition of Novolog sensitive correction tidwc.  Will continue to follow. Thank you. RLorenda Peck RD, LDN, CDE Inpatient Diabetes Coordinator [phone]

## 2013-03-12 NOTE — Progress Notes (Addendum)
Subjective: Pt reports that she feels much better today. No new complaints. Currently on non-rebreather mask at 15L. No change or increase in sputum, breathing better today. No dizziness.  Objective: Vital signs in last 24 hours: Filed Vitals:   03/12/13 0115 03/12/13 0119 03/12/13 0530 03/12/13 0935  BP: 129/64  115/57   Pulse: 100  86   Temp: 98.4 F (36.9 C)  98.3 F (36.8 C)   TempSrc: Oral  Oral   Resp: 22  22   Height: _0  (1.651 m)     Weight: 245 lb 6 oz (111.3 kg)     SpO2: 91% 91% 95% 91%   Weight change:   Intake/Output Summary (Last 24 hours) at 03/12/13 1314 Last data filed at 03/12/13 1238  Gross per 24 hour  Intake    581 ml  Output    250 ml  Net    331 ml   General appearance: alert, cooperative and appears stated age, on O2 by nasal canula. Lungs: few Diffuse crackles heard. Heart: regular rate and rhythm, S1, S2 normal, no murmur, click, rub or gallop Abdomen: soft, non-tender; bowel sounds normal; no masses,  no organomegaly Extremities: extremities normal, atraumatic, no cyanosis or edema Pulses: No pedal edema.  Lab Results: Basic Metabolic Panel:  Recent Labs Lab 03/11/13 1948 03/12/13 0115  NA 138 140  K 3.9 3.0*  CL 95* 90*  CO2 40* 34*  GLUCOSE 126* 405*  BUN 7 10  CREATININE 0.62 0.83  CALCIUM 9.1 8.8   Liver Function Tests:  Recent Labs Lab 03/11/13 1948  AST 10  ALT 13  ALKPHOS 100  BILITOT 0.7  PROT 6.5  ALBUMIN 3.3*   CBC:  Recent Labs Lab 03/11/13 1948 03/12/13 0115  WBC 13.2* 12.0*  NEUTROABS 10.4*  --   HGB 14.3 13.7  HCT 45.8 44.5  MCV 98.5 98.5  PLT 121* 125*   Cardiac Enzymes:  Recent Labs Lab 03/12/13 0115  TROPONINI <0.30   BNP:  Recent Labs Lab 03/11/13 1948  PROBNP 663.4*   Urine Drug Screen: Drugs of Abuse     Component Value Date/Time   LABOPIA NEGATIVE 02/04/2009 2315   COCAINSCRNUR NEGATIVE 02/04/2009 2315   LABBENZ NEGATIVE 02/04/2009 2315   AMPHETMU NEGATIVE 02/04/2009 2315      Micro Results: No results found for this or any previous visit (from the past 240 hour(s)). Studies/Results: X-ray Chest Pa And Lateral   03/12/2013   CLINICAL DATA:  Short of breath, COPD  EXAM: CHEST  2 VIEW  COMPARISON:  Chest radiograph 03/11/2013   IMPRESSION: Bibasilar airspace opacity suggesting pulmonary edema versus infiltrates or combination of both. No significant change from prior.   Electronically Signed   By: Suzy Bouchard M.D.   On: 03/12/2013 09:15   Dg Chest Portable 1 View  03/11/2013   CLINICAL DATA:  Short of breath.  COPD.  EXAM: PORTABLE CHEST - 1 VIEW  COMPARISON:  01/09/2013.    IMPRESSION: Moderate CHF.   Electronically Signed   By: Dereck Ligas M.D.   On: 03/11/2013 20:17   Medications: I have reviewed the patient's current medications.  Scheduled Meds: . aspirin  81 mg Oral Daily  . [START ON 03/13/2013] azithromycin  250 mg Oral Daily  . enoxaparin (LOVENOX) injection  55 mg Subcutaneous Q24H  . ipratropium  0.5 mg Nebulization Q6H  . levalbuterol  0.63 mg Nebulization Q6H  . nicotine  21 mg Transdermal Daily  . predniSONE  50  mg Oral Q breakfast  . sodium chloride  3 mL Intravenous Q12H  . sodium chloride  3 mL Intravenous Q12H   Continuous Infusions:  PRN Meds:.sodium chloride, albuterol, cyclobenzaprine, guaifenesin, sodium chloride Assessment/Plan: Active Problems:   TOBACCO ABUSE   Coronary artery disease   Chronic diastolic CHF (congestive heart failure)   COPD exacerbation  # Shortness of breath: Likely due to COPD Vs Acute on chronic dCHF exacerbation. She has gold stage IV COPD, not been compliant to her home medications. Home meds-  albuterol, Flovent, and Dulera inhalers, and albuterol/Atrovent nebulizers at home, but she is only taking albuterol and Flovent inhalers at home. Current smoker. Echo 2014  - Grade 2 dCHF, 11/07/2012- Echo reveal EF- 55-60%.  She also has mild to moderate pulmonary hypertension as pulmonary artery pressure  36 as per R heart cath 11/2012. She supposed to take Lasix 40 mg daily, but she is only taking 20 mg daily. ProBNP- 663. PNA is a possibility given leukocytosis and mild fever, though this did not appear on CXR, repeat CBC repeat WBC- 12.0, likely due to Solu-Medrol treatment by EMS. Repeat Chest xray- read as no signif change from prior. Pt has gotten 40m Iv total of lasix, in-put/out-put chat- Output- 2551m. As per pts nurse, pt has not passed much urine since morning, and will have a scan done. - Cont albulizers: Xopenex and Atrovent every 6 hours  - Albuterol nebulizers when necessary every 2 hours  - Cont Oral prednisone 50 mg daily  - Robitussen prn for cough  - Change non-rebreather to O2, keep O2 sats- 88-92% as she chronically retains CO2 (Avoid O2 toxicity)  - incentive spirometry  - start Z-Pak . - follow BMP for electrolytes. - blood culture X2 given fever- Pending.`  # Hx of CAD: Stable. No chest pain, POC-Trop Neg. Hasnt seen her cardiologist in 2 years, because she said she couldn't afford it financially, though she has insurance. Says her cardiologist is with [facility].   # Hyperlipidemia: LDL was 136 on 11/09/12. Patient was on lovastatin 20 mg daily at home.  -will switch to simvastatin 10 mg daily while in hospital   # Tobacco dependence  - nicotine patch 1454m-discussed quitting smoking   #DVT PPX: Sq lovenox.  Dispo: Disposition is deferred at this time, awaiting improvement of current medical problems.  The patient does have a current PCP (RiJessee AversD) and [facility].  The patient does have transportation limitations that hinder transportation to clinic appointments.  .Services Needed at time of discharge: Y = Yes, Blank = No PT:   OT:   RN:   Equipment:   Other:     LOS: 1 day   EjiJenetta DownerD 03/12/2013, 1:14 PM

## 2013-03-12 NOTE — H&P (Signed)
  Date: 03/12/2013  Patient name: [name]  Medical record number: [id number]  Date of birth: 1955/12/01   I have seen and evaluated [name] and discussed their care with the Residency Team. [name] was admitted with acute Resp failure liekly 2/2 both COD exac and HF exac. On exam, she has poor air movement 2/2 body habitus but no rhonchi / crackles appreciated. She uses O2 at home continuously. She cont to smoke.   Assessment and Plan: I have seen and evaluated the patient as outlined above. I agree with the formulated Assessment and Plan as detailed in the residents' admission note, with the following changes:   1. Acute Resp Failure - 2/2 COPD and HF exac. She was tx with IV steroids, lasix, ABX, O2, and nebs and currently feels better but not at baseline. Cont tx both etiologies and follow exam, I/O, and wts.   2. Tobacco use - nicotine patch.  Bartholomew Crews, MD 12/9/20142:11 PM

## 2013-03-12 NOTE — Progress Notes (Signed)
PT arrived from ED via stretcher with venturi mask on. Pt in no apparent distress. Pt's VSS. Pt was placed on telemetry (box #16) and CCMD was notified. RN to get pt something to eat for dinner. Pt refusing to get swabbed for MRSA. Pt to placed on contact precautions but refuses to be swabbed for MRSA stating "unless they can do a blood test to prove that I have it, I'm not going to take any antibiotics or anything for it. It's embarrassing when I have to tell my visitors that I have MRSA and they have to put on gowns to come see me"  RN explained to pt the importance of being swabbed and the protocal. Pt states she understands. RN will continue to monitor pt.

## 2013-03-13 DIAGNOSIS — J441 Chronic obstructive pulmonary disease with (acute) exacerbation: Secondary | ICD-10-CM

## 2013-03-13 LAB — MRSA PCR SCREENING: MRSA by PCR: NEGATIVE

## 2013-03-13 MED ORDER — PREDNISONE 20 MG PO TABS: 40.0000 mg | ORAL_TABLET | Freq: Every day | ORAL | Status: DC

## 2013-03-13 MED ORDER — AZITHROMYCIN 250 MG PO TABS: 250.0000 mg | ORAL_TABLET | Freq: Every day | ORAL | Status: AC

## 2013-03-13 MED ORDER — ACETAMINOPHEN 325 MG PO TABS
650.0000 mg | ORAL_TABLET | Freq: Four times a day (QID) | ORAL | Status: DC | PRN
  Administered 2013-03-13: 650 mg via ORAL
  Filled 2013-03-13: qty 2

## 2013-03-13 MED ORDER — AZITHROMYCIN 250 MG PO TABS: 250.0000 mg | ORAL_TABLET | Freq: Once | ORAL | Status: DC

## 2013-03-13 NOTE — Discharge Summary (Signed)
Name: [name] MRN: [id number] DOB: 1955-06-29 57 y.o. PCP: Jessee Avers, MD  Date of Admission: 03/11/2013  7:35 PM Date of Discharge: 03/13/2013 Attending Physician: No att. providers found  Discharge Diagnosis:  Active Problems:   TOBACCO ABUSE   Coronary artery disease   Chronic diastolic CHF (congestive heart failure)   COPD exacerbation  Discharge Medications:   Medication List         albuterol 108 (90 BASE) MCG/ACT inhaler  Commonly known as:  PROVENTIL HFA;VENTOLIN HFA  Inhale 1 puff into the lungs every 4 (four) hours as needed for wheezing or shortness of breath.     aspirin 81 MG chewable tablet  Chew 1 tablet (81 mg total) by mouth daily.     azithromycin 250 MG tablet  Commonly known as:  ZITHROMAX  Take 1 tablet (250 mg total) by mouth daily.     cyclobenzaprine 5 MG tablet  Commonly known as:  FLEXERIL  Take 1 tablet (5 mg total) by mouth 3 (three) times daily as needed for muscle spasms.     fluticasone 110 MCG/ACT inhaler  Commonly known as:  FLOVENT HFA  Inhale 1 puff into the lungs 2 (two) times daily.     furosemide 20 MG tablet  Commonly known as:  LASIX  Take 2 tablets (40 mg total) by mouth every morning.     ipratropium 0.02 % nebulizer solution  Commonly known as:  ATROVENT  Take 2.5 mLs (0.5 mg total) by nebulization every 4 (four) hours as needed for wheezing.     lovastatin 20 MG tablet  Commonly known as:  MEVACOR  Take 1 tablet (20 mg total) by mouth at bedtime.     mometasone-formoterol 100-5 MCG/ACT Aero  Commonly known as:  DULERA  Inhale 2 puffs into the lungs.     predniSONE 20 MG tablet  Commonly known as:  DELTASONE  Take 2 tablets (40 mg total) by mouth daily with breakfast.        Disposition and follow-up:   [name] was discharged from [facility] in Good condition.  At the hospital follow up visit please address:  1.  Compliance with medications, inhalers and  frusemide, BMP, O2 requirements at home returned to normal? Pt should have a  Follow up visit with her PCP- so that she can have an out-pt cardiology follow up- she hasnt seen her cardiologist in about 2 years, and has CAD and pulm HTN. Continued counselling on smoking cessation.  2.  Labs / imaging needed at time of follow-up: None.  3.  Pending labs/ test needing follow-up: none.  Follow-up Appointments:     Follow-up Information   Follow up with Jessee Avers, MD On 03/18/2013. (At 10.30am)    Specialty:  Internal Medicine   Contact information:   [place] [address] [phone]       Discharge Instructions: Discharge Orders   Future Appointments Provider Department Dept Phone   03/18/2013 10:30 AM Clinton Gallant, MD [place] [phone]   04/11/2013 8:45 AM Gatha Mayer, MD [facility] [phone]   04/17/2013 3:45 PM Jessee Avers, MD [facility] [place] [phone]   Future Orders Complete By Expires   (HEART FAILURE PATIENTS) Call MD:  Anytime you have any of the following symptoms: 1) 3 pound weight gain in 24 hours or 5 pounds in 1 week 2) shortness of breath, with or without a dry hacking  cough 3) swelling in the hands, feet or stomach 4) if you have to sleep on extra pillows at night in order to breathe.  As directed    Call MD for:  persistant dizziness or light-headedness  As directed    Diet - low sodium heart healthy  As directed    Discharge instructions  As directed    Comments:     Please follow up with [place] in clinic on the 15th of this month. We will be discharging you homne on Azithromycin- 272m once a day for 3 more days. Also take your prednisone- 464mdaily for 5 more days.  Please use your inhalers and nebulizers and oxygen.  Also take your frusemide- 4030mnce a day to prevent fluid from accumulating in your body.   Increase activity slowly  As directed        Consultations:  None  Procedures Performed:  X-ray Chest Pa And Lateral   03/12/2013   CLINICAL DATA:  Short of breath, COPD  EXAM: CHEST  2 VIEW  COMPARISON:  Chest radiograph 03/11/2013  FINDINGS: Stable enlarged cardiac silhouette. There is bibasilar airspace disease . Trace effusions are present. There is central venous congestion in the upper lobes. No pneumothorax  IMPRESSION: Bibasilar airspace opacity suggesting pulmonary edema versus infiltrates or combination of both. No significant change from prior.   Electronically Signed   By: SteSuzy BouchardD.   On: 03/12/2013 09:15   Dg Chest Portable 1 View  03/11/2013   CLINICAL DATA:  Short of breath.  COPD.  EXAM: PORTABLE CHEST - 1 VIEW  COMPARISON:  01/09/2013.  FINDINGS: Cardiomegaly. Perihilar and basilar predominant airspace disease is present compatible with CHF and pulmonary edema. Monitoring leads project over the chest. Basilar atelectasis.  IMPRESSION: Moderate CHF.   Electronically Signed   By: GeoDereck LigasD.   On: 03/11/2013 20:17    2D Echo: None.  Cardiac Cath: None.  Admission HPI: Chief Complaint: SOB, chest tightness  History of Present Illness:  [name] a [age]yo w/ PMH obesity, HL, GERD, CAD s/p DES to LAD (2010), mild-moderate pulmonary HTN (R heart cath 11/2012), stage IV COPD on 4L home oxygen (last PFTs 2010), dCHF (grade I diastolic dysfunction w/ EF 60-65%) and tobacco abuse who presents with worsening SOB and chest tightness x 2 days.  Pt reports she was in her normal state of health until two days ago when she noticed her baseline SOB worsening. Today when she was sitting on the couch her SOB gradually worsened further, causing her to call EMS. Patient also has constant, worsening chest tightness over the past two days. She has a productive cough at baseline and denies that her cough has changed in nature recently, sputum is clear in color and no recent increase in amount of sputum produced. Patient  is supposed to be on albuterol inhaler prn, albuterol/atrovent neb prn, flovent, dulera and lasix 59m41mily. Patient ran out of her albuterol inhaler three days ago and just got it refilled yesterday. She took her albuterol inhaler twice yesterday and only once today. She has not been taking her nebulizer treatments because she "forgets she has them," nor has she been taking the dulera 2/2 financial constraints. She does take flovent. She takes only half of the prescribed dose of lasix given urinary frequency interferes w/ her daily activities if she takes the full dose. She denies F/C, N/V/D, recent URI symptoms, pain in calves, leg swelling, dysuria, hematuria. She does endorse  having a chronic issue w/ constipation that causes her some abd pain, but no change recently. Of note, patient is disabled and does not work. She lives w/ her grandson and a roommate who help care for her when necessary. No recent travel, but she does lead a rather sedentary lifestyle.  Patient was originally put on bipap by EMS for O2 sat 86% on 4LPM O2. Per EMS, she also received 1 duoneb as well as solumedrol 130m by EMS. In the ED, T 100.1, BP 145/50, HR 101, RR 18, satting 91% on 5L via nonrebreather mask. CMP: bicarb 40 (approx baseline), Cr .62. CBC: WBC 13.2, PLT 121. Pro-BNP 663. POC Trop neg x 1. ABG: pH 7.3, pCO2 76.1, pO2 60, bicarb 40. CXR showed pulmonary edema c/w CHF. EKG showed NSR w/o change from prior. When she arrived in ED bipap was removed at she was placed on nonrebreather and given continuous albuterol neb w/ atrovent x 1 dose, as well as lasix 415mIV x 1 dose.   Physical Exam:  Blood pressure 93/49, pulse 109, temperature 100.1 F (37.8 C), temperature source Oral, resp. rate 21, SpO2 90.00%. on nonrebreather mask @ 5LPM O2  General: alert, cooperative, and in no apparent distress; nonrebreather mask in place HEENT: pupils equal round and reactive to light, vision grossly intact, oropharynx clear and  non-erythematous; MMM Neck: supple, no JVD or carotid bruits Lungs: decreased air movement bilaterally with bibasilar inspiratory crackles, mildly increased WOB, no wheezes or rhonchi Heart: tachycardic to low 100s, normal rhythm; no murmurs, gallops, or rubs Abdomen: obese abdomen; soft, non-tender, non-distended, normal bowel sounds  Extremities: warm extremities bilaterally, trace BLE edema; no palpable cords or calf tenderness; no erythema to BLE Neurologic: alert & oriented X3, cranial nerves II-XII grossly intact, strength grossly intact, sensation intact to light touch  Hospital Course by problem list:    # Acute on Chronic Diastolic CHF, with COPD exacerbation: Pt presented with shortness of breath likely a combination of COPD and CHF exacerbation. She has gold stage IV COPD and she has not been compliant to her home medications. She was supposed to take albuterol, Flovent, and Dulera inhalers, and albuterol/Atrovent nebulizers at home, but she is only taking albuterol and Flovent inhalers at home. She continues to smoke one pack a day. Patient has hx of grade 2 diastolic dysfunction by echo in 2010. She also has mild to moderate pulmonary hypertension as pulmonary artery pressure 36 as per R heart cath 11/2012. She supposed to take Lasix 40 mg daily, but she is only taking 20 mg daily. Although her proBNP was no significantly elevated (663 on this admission), her physical exam and chest x-ray are consistent with pulmonary edema 2/2 CHF. PNA also possible given leukocytosis and mild fever, though not present on CXR. Pt was started on Albuterol, xopenex and ipratropium inhalers, was given a total of IV 8063masix, and started on her oral home dose of lasix- 12m79mily-one dose, with appropriate response, also Azithromycin was given to cover for CAP. Pt showed significant improvement, goal of O2 sats was 88-92% on O2 by nasal canula. Pt uses 4L of O2 at baseline at home. Discharged home to take- Oral  prednisone 40 mg daily for 5 more days then stop, and to complete 3 more days of 250mg63mAzithromycin.  # Hx of CAD: Stable. The patient does have chest tightness that is not anginal in nature and is chronic. There are no ischemic changes on EKG and poc troponin negative  and trop X1 negative. Continue home aspirin 81 mg daily. Chest pain soon resolved with diuretics and antibiotic treament.  # Hyperlipidemia: LDL was 136 on 11/09/12. Patient was on lovastatin 20 mg daily at home, continued on discharge.  # Tobacco dependence - nicotine patch 11m and counselled on smoking cessation, not ready to quit.  Discharge Vitals:   BP 147/70  Pulse 88  Temp(Src) 98.9 F (37.2 C) (Oral)  Resp 20  Ht _0  (1.651 m)  Wt 245 lb 15.1 oz (111.56 kg)  BMI 40.93 kg/m2  SpO2 93%  Discharge Labs:  Results for orders placed during the hospital encounter of 03/11/13 (from the past 24 hour(s))  MRSA PCR SCREENING     Status: None   Collection Time    03/13/13  9:53 AM      Result Value Range   MRSA by PCR NEGATIVE  NEGATIVE    Signed: EJenetta Downer MD 03/13/2013, 5:47 PM   Time Spent on Discharge: 35 minutes Services Ordered on Discharge: None Equipment Ordered on Discharge: None

## 2013-03-13 NOTE — Progress Notes (Signed)
  Date: 03/13/2013  Patient name: [name]  Medical record number: [id number]  Date of birth: 1955/12/07   This patient has been seen and the plan of care was discussed with the house staff. Please see their note for complete details. I concur with their findings with the following additions/corrections: Ms Worthy states that she is feeling better than her baseline, except for the freq urination. Her breathing is better and on exam, her air movement is better than last exam and there are no sig crackles / wheezing. She is stable for D/C on steroids,lasix, ABX, and MDI with close F/U.   Bartholomew Crews, MD 03/13/2013, 6:15 PM

## 2013-03-13 NOTE — Progress Notes (Addendum)
Subjective: Pt reports that she feel better than her baseline. Was initally adamnant about taking her frusemide yesterday, saying she didn't want to be up all night using the bathroom, but took her frusemide this morning. Pt eager to go home. Breathing better.   Objective: Vital signs in last 24 hours: Filed Vitals:   03/12/13 2155 03/13/13 0256 03/13/13 0715 03/13/13 0907  BP:   112/70   Pulse:   85   Temp:   98.3 F (36.8 C)   TempSrc:   Oral   Resp:   20   Height:      Weight:   245 lb 15.1 oz (111.56 kg)   SpO2: 91% 93% 91% 92%   Weight change:   Intake/Output Summary (Last 24 hours) at 03/13/13 1130 Last data filed at 03/13/13 0900  Gross per 24 hour  Intake   1025 ml  Output   1550 ml  Net   -525 ml   General appearance: NAD, alert, cooperative and appears as stated age, on O2 by nasal canula- 5L today. Lungs: Mild crackles in Rt lower lung zone. Much improved compared to yesterdays assessment. Heart: regular rate and rhythm, S1, S2 normal, no murmur, click, rub or gallop Abdomen: soft, non-tender; bowel sounds normal; no masses,  no organomegaly Extremities: extremities normal, atraumatic, no cyanosis or edema Pulses: No pedal edema.  Lab Results: Basic Metabolic Panel:  Recent Labs Lab 03/12/13 0115 03/12/13 1620  NA 140 138  K 3.0* 4.9  CL 90* 96  CO2 34* 37*  GLUCOSE 405* 205*  BUN 10 20  CREATININE 0.83 1.06  CALCIUM 8.8 9.1   Liver Function Tests:  Recent Labs Lab 03/11/13 1948  AST 10  ALT 13  ALKPHOS 100  BILITOT 0.7  PROT 6.5  ALBUMIN 3.3*   CBC:  Recent Labs Lab 03/11/13 1948 03/12/13 0115  WBC 13.2* 12.0*  NEUTROABS 10.4*  --   HGB 14.3 13.7  HCT 45.8 44.5  MCV 98.5 98.5  PLT 121* 125*   Cardiac Enzymes:  Recent Labs Lab 03/12/13 0115  TROPONINI <0.30   BNP:  Recent Labs Lab 03/11/13 1948  PROBNP 663.4*   Urine Drug Screen: Drugs of Abuse     Component Value Date/Time   LABOPIA NEGATIVE 02/04/2009 2315   COCAINSCRNUR NEGATIVE 02/04/2009 2315   LABBENZ NEGATIVE 02/04/2009 2315   AMPHETMU NEGATIVE 02/04/2009 2315     Micro Results: No results found for this or any previous visit (from the past 240 hour(s)). Studies/Results: X-ray Chest Pa And Lateral   03/12/2013   CLINICAL DATA:  Short of breath, COPD  EXAM: CHEST  2 VIEW  COMPARISON:  Chest radiograph 03/11/2013   IMPRESSION: Bibasilar airspace opacity suggesting pulmonary edema versus infiltrates or combination of both. No significant change from prior.   Electronically Signed   By: Suzy Bouchard M.D.   On: 03/12/2013 09:15   Dg Chest Portable 1 View  03/11/2013   CLINICAL DATA:  Short of breath.  COPD.  EXAM: PORTABLE CHEST - 1 VIEW  COMPARISON:  01/09/2013.    IMPRESSION: Moderate CHF.   Electronically Signed   By: Dereck Ligas M.D.   On: 03/11/2013 20:17   Medications: I have reviewed the patient's current medications.  Scheduled Meds: . aspirin  81 mg Oral Daily  . azithromycin  250 mg Oral Daily  . enoxaparin (LOVENOX) injection  55 mg Subcutaneous Q24H  . furosemide  40 mg Oral Daily  . ipratropium  0.5 mg  Nebulization Q6H  . levalbuterol  0.63 mg Nebulization Q6H  . nicotine  21 mg Transdermal Daily  . predniSONE  50 mg Oral Q breakfast  . sodium chloride  3 mL Intravenous Q12H  . sodium chloride  3 mL Intravenous Q12H   Continuous Infusions:  PRN Meds:.sodium chloride, acetaminophen, albuterol, cyclobenzaprine, guaifenesin, sodium chloride Assessment/Plan: Active Problems:   TOBACCO ABUSE   Coronary artery disease   Chronic diastolic CHF (congestive heart failure)   COPD exacerbation  # Shortness of breath: Combination of COPD and CHF exacerbation, with possible PNA. She has gold stage IV COPD, not been compliant to her home medications. Home meds-  albuterol, Flovent, and Dulera inhalers, and albuterol/Atrovent nebulizers at home, but she is only taking albuterol and Flovent inhalers at home. Current smoker. Echo  2014  - Grade 2 dCHF, 11/07/2012- Echo reveal EF- 55-60%.  She also has mild to moderate pulmonary hypertension as pulmonary artery pressure 36 as per R heart cath 11/2012. She supposed to take Lasix 40 mg daily, but she is only taking 20 mg daily. ProBNP- 663. PNA is a possibility given leukocytosis and mild fever, though this did not appear on CXR, repeat CBC repeat WBC- 12.0, likely due to Solu-Medrol treatment by EMS. Repeat Chest xray- read as no signif change from prior. Urine Output- 1550. O2 sats over the past 24hrs- 88-94%. - Discharge home today, with close follow up in clinic on Monday.  - Ambulatory pulse ox- O2 sats 88-96% on 5L, pt uses 4L at home, with continued diuresis O2 sats will improve and oxygen requirement will reduce to baseline.  - Prednisone 63m daily on discharge for 5 days. - Cont albulizers: Xopenex and Atrovent every 6 hours  - Albuterol nebulizers when necessary every 2 hours  -  Robitussen prn for cough  - Pt is on 2nd day of azithro, Continue azithromycin 2551mfor 3 more days.  # Hx of CAD: Stable. No chest pain, POC-Trop Neg. Hasnt seen her cardiologist in 2 years, because she said she couldn't afford it financially, though she has insurance. Says her cardiologist is with [facility].   # Hyperlipidemia: LDL was 136 on 11/09/12. Patient was on lovastatin 20 mg daily at home.  -will switch to simvastatin 10 mg daily while in hospital   # Tobacco dependence  - nicotine patch 1430m-discussed quitting smoking   #DVT PPX: Sq lovenox.  Dispo: Disposition is deferred at this time, awaiting improvement of current medical problems.  The patient does have a current PCP (RiJessee AversD) and [facility].  The patient does have transportation limitations that hinder transportation to clinic appointments.  .Services Needed at time of discharge: Y = Yes, Blank = No PT:   OT:   RN:   Equipment:   Other:     LOS: 2  days   EjiJenetta DownerD 03/13/2013, 11:30 AM

## 2013-03-13 NOTE — Progress Notes (Signed)
Patient is complaining of a HA. Internal teaching medicine was paged. Provider Rebecca Eaton told about patient's HA and order for Tylenol was placed. Will administered once available. Will continue to monitor.  Kanijah Groseclose J. Conley Canal RN

## 2013-03-13 NOTE — Clinical Documentation Improvement (Signed)
THIS DOCUMENT IS NOT A PERMANENT PART OF THE MEDICAL RECORD  Please update your documentation with the medical record to reflect your response to this query. If you need help knowing how to do this please call [phone].  03/13/13  Dear Dr. [facility],  In a better effort to capture your patient's severity of illness, reflect appropriate length of stay and utilization of resources, a review of the patient medical record has revealed the patient was admitted with respiratory failure 2/2 COPD and HF exacerbation (12/9 progress note).    Based on your clinical judgment, please clarify and document in progress note and discharge summary the:   Type of CHF - Systolic, Diastolic, Systolic and Diastolic, Other  Acuity of - Acute, Chronic, Acute on Chronic  In responding to this query please exercise your independent judgment.  The fact that a query is asked, does not imply that any particular answer is desired or expected.   Reviewed: chronic diastolic heart failure is documented  Thank You,  Zoila Shutter  Clinical Documentation Specialist: [place]

## 2013-03-18 ENCOUNTER — Encounter: Payer: Self-pay | Admitting: Internal Medicine

## 2013-03-18 ENCOUNTER — Ambulatory Visit: Payer: Medicare Other | Admitting: Internal Medicine

## 2013-03-21 NOTE — Addendum Note (Signed)
Addended by: Hulan Fray on: 03/21/2013 08:19 PM   Modules accepted: Orders

## 2013-04-11 ENCOUNTER — Ambulatory Visit: Payer: Medicare Other | Admitting: Internal Medicine

## 2013-04-13 IMAGING — CT CT ANGIO CHEST
2 of 7 series · 19 of 46 positions shown · IV contrast (APPLIED)
Comparison: 12/13/2008

CLINICAL DATA: Chest pain and shortness of breath.  Broken both
feet with in the past 6 weeks.

CT ANGIOGRAPHY CHEST WITH CONTRAST
TECHNIQUE: Multidetector CT imaging of the chest was performed
using the standard protocol during bolus administration of
intravenous contrast.  Multiplanar CT image reconstructions
including MIPs were obtained to evaluate the vascular anatomy.
Contrast:  100 ml Omnipaque 300

[Series 10: pulm embolism 1.0 b25f thin · axial · 0.70mm/px · z∈[-214,+60]mm · 16 of 301 slices shown]
[im 14/301  lung]
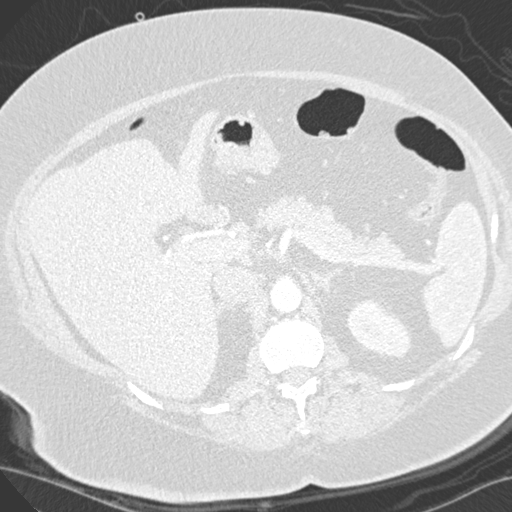
[im 28/301  soft-tissue]
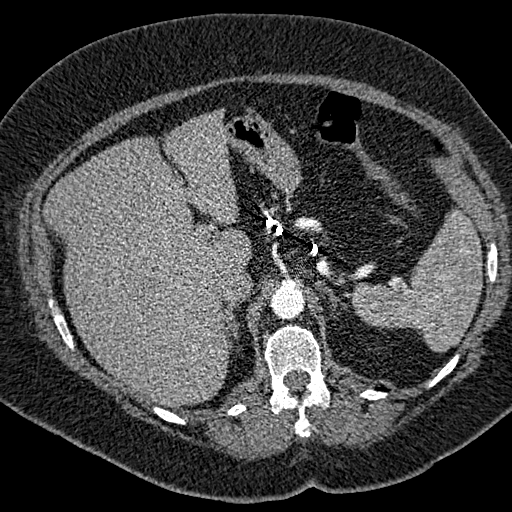
[im 55/301  lung]
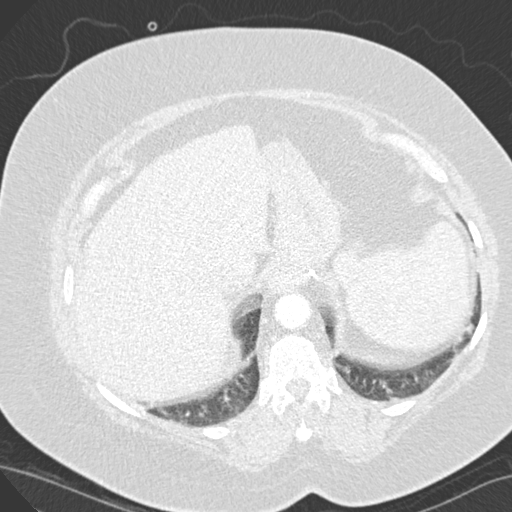
[im 69/301  soft-tissue]
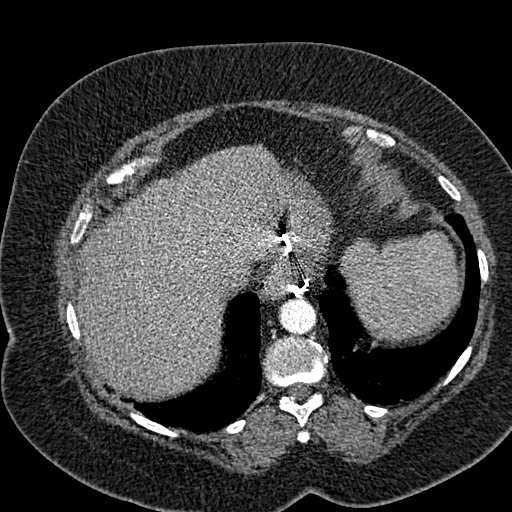
[im 82/301  lung]
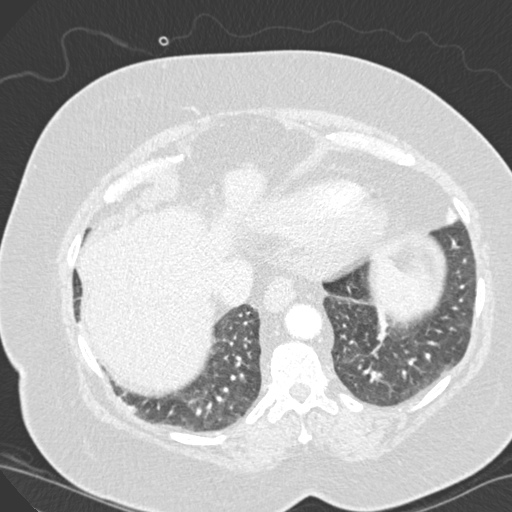
[im 110/301  soft-tissue]
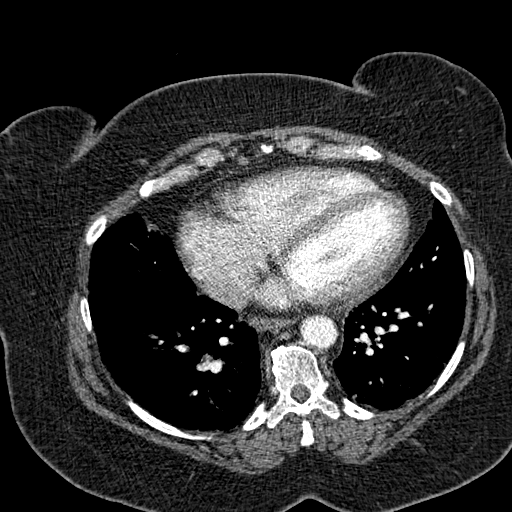
[im 123/301  lung]
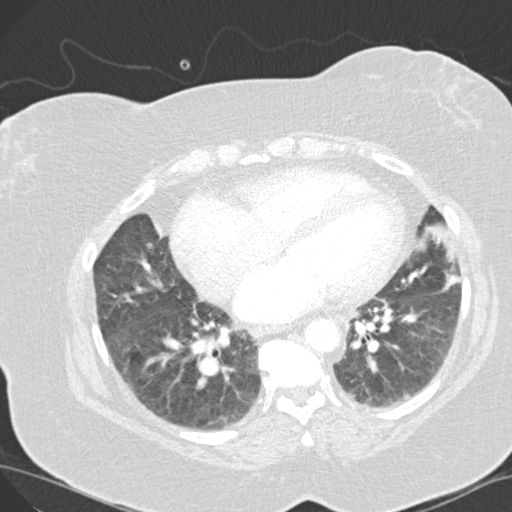
[im 137/301  soft-tissue]
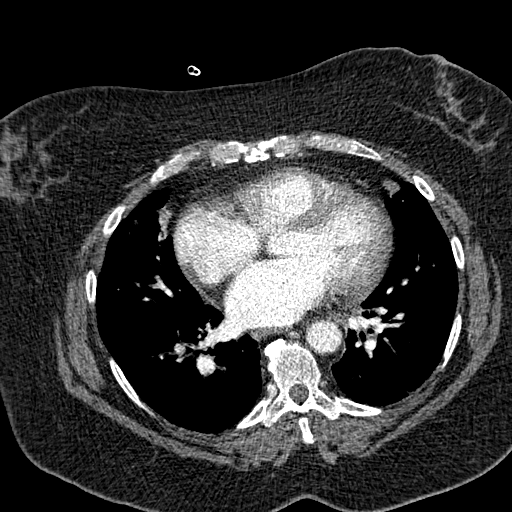
[im 164/301  lung]
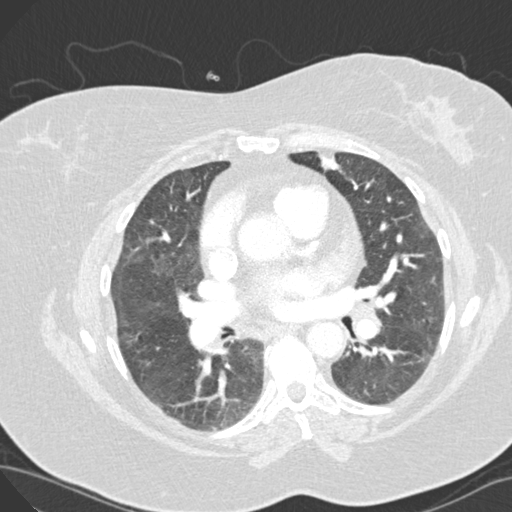
[im 178/301  soft-tissue]
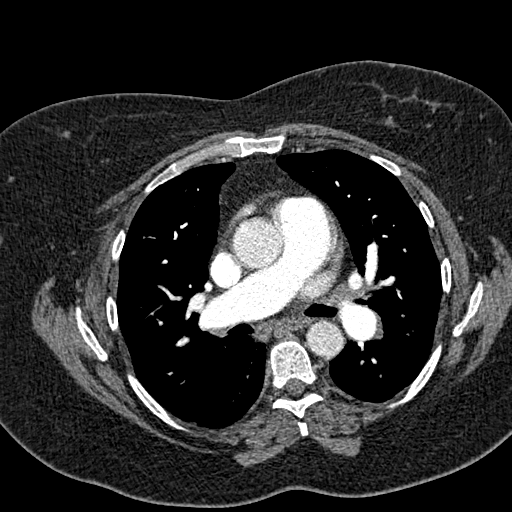
[im 191/301  lung]
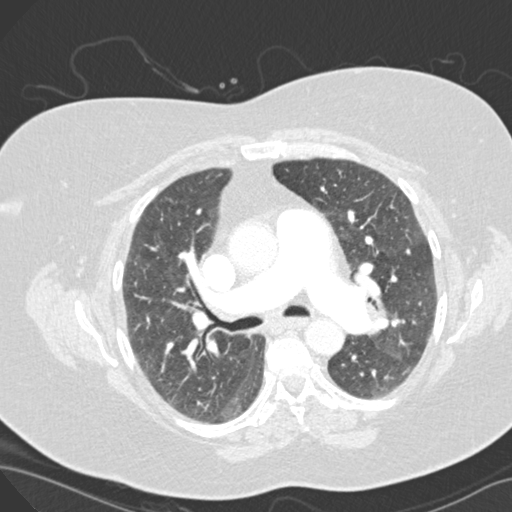
[im 219/301  soft-tissue]
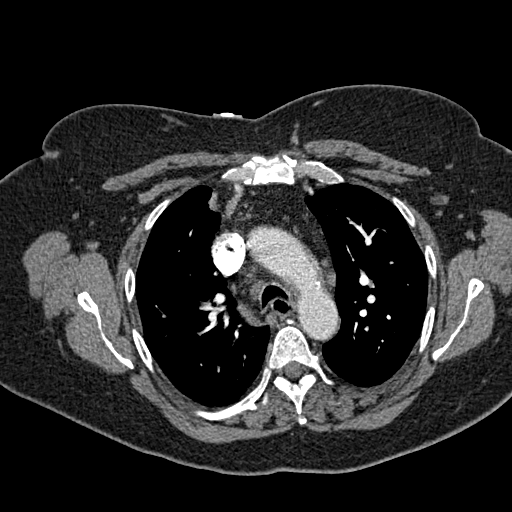
[im 232/301  lung]
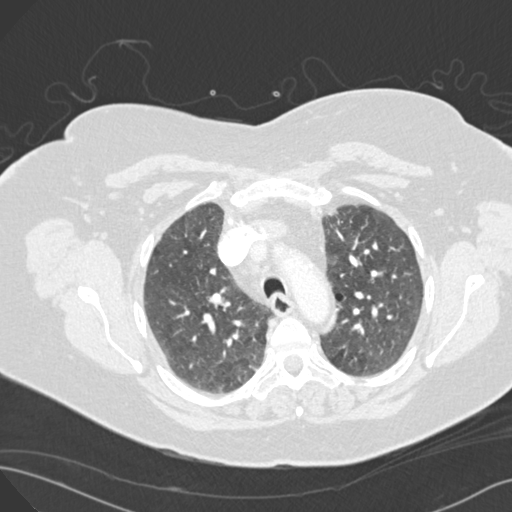
[im 246/301  soft-tissue]
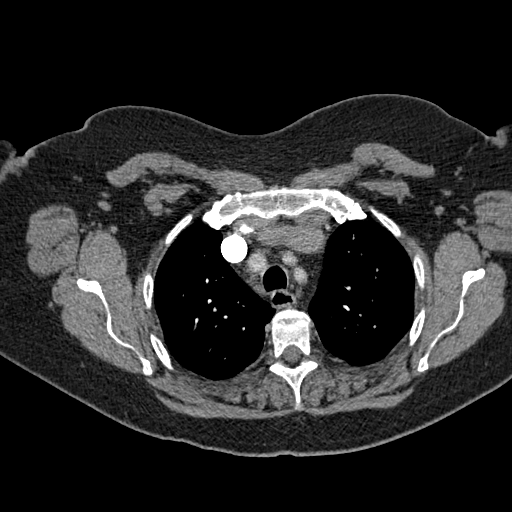
[im 273/301  lung]
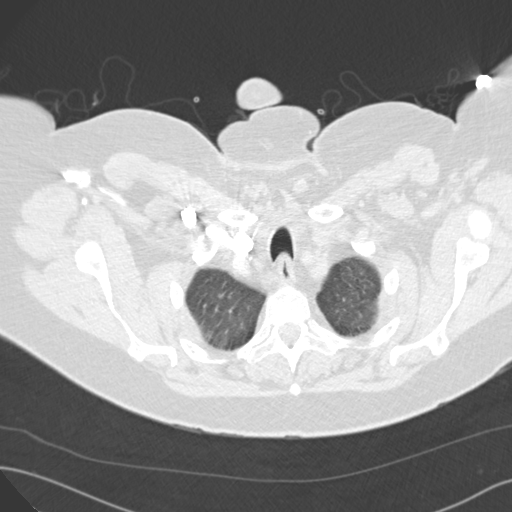
[im 287/301  soft-tissue]
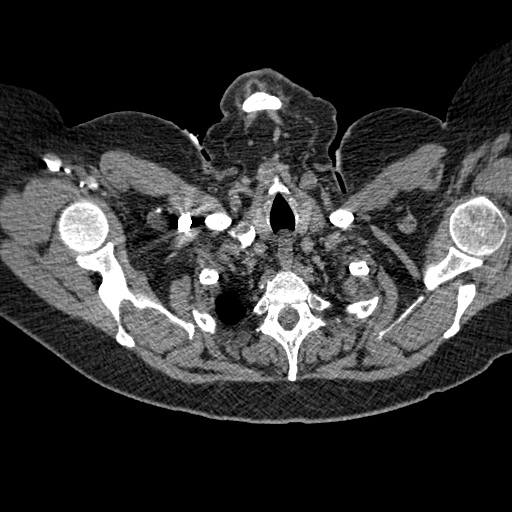

[Series 11: pulm embolism 2.0 spo thin · coronal · 0.70mm/px · 3 of 150 slices shown]
[im 38/150  soft-tissue]
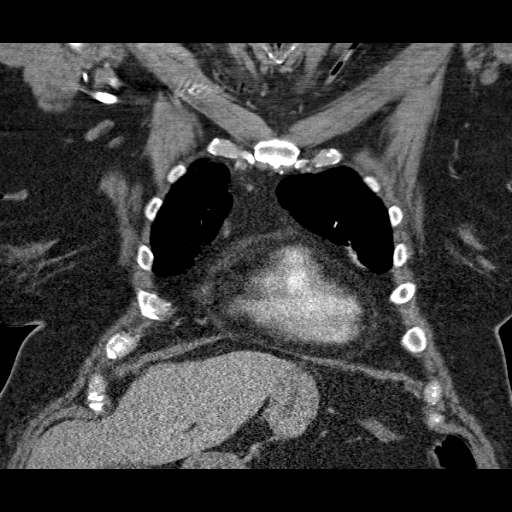
[im 75/150  soft-tissue]
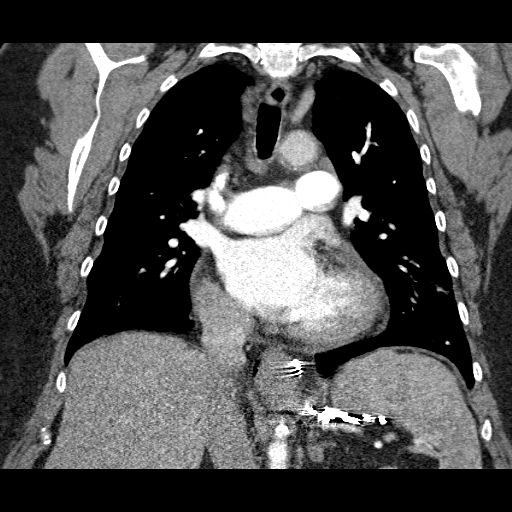
[im 112/150  soft-tissue]
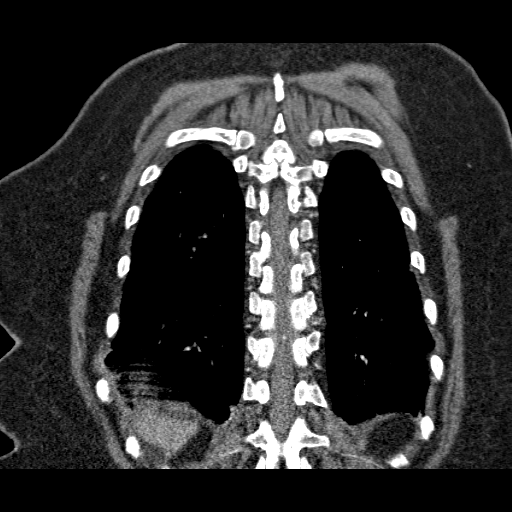

[19 of 46 positions shown; findings below may reference images not displayed]

FINDINGS: Technically adequate study with good opacification of
the central and segmental pulmonary arteries.  No focal filling
defects.  No evidence of significant pulmonary embolus.  Normal
caliber thoracic aorta with mild calcification.  Calcification in
the coronary arteries.  No significant lymphadenopathy in the
chest.  No pleural effusions.  Surgical clips in the EG junction.
Visualization of the lung fields is limited due to respiratory
motion artifact but there appears to be interstitial fibrosis in
the lung bases with linear atelectasis in the middle lungs
bilaterally.  No focal consolidation.

Review of the MIP images confirms the above findings.
IMPRESSION: No evidence of significant pulmonary embolus.

## 2013-04-13 IMAGING — CR DG CHEST 2V
2 series · 2 of 2 positions shown · non-contrast
Comparison: Chest x-ray 06/01/2010.

CLINICAL DATA: Chest pain.

CHEST - 2 VIEW

[w chest pa]
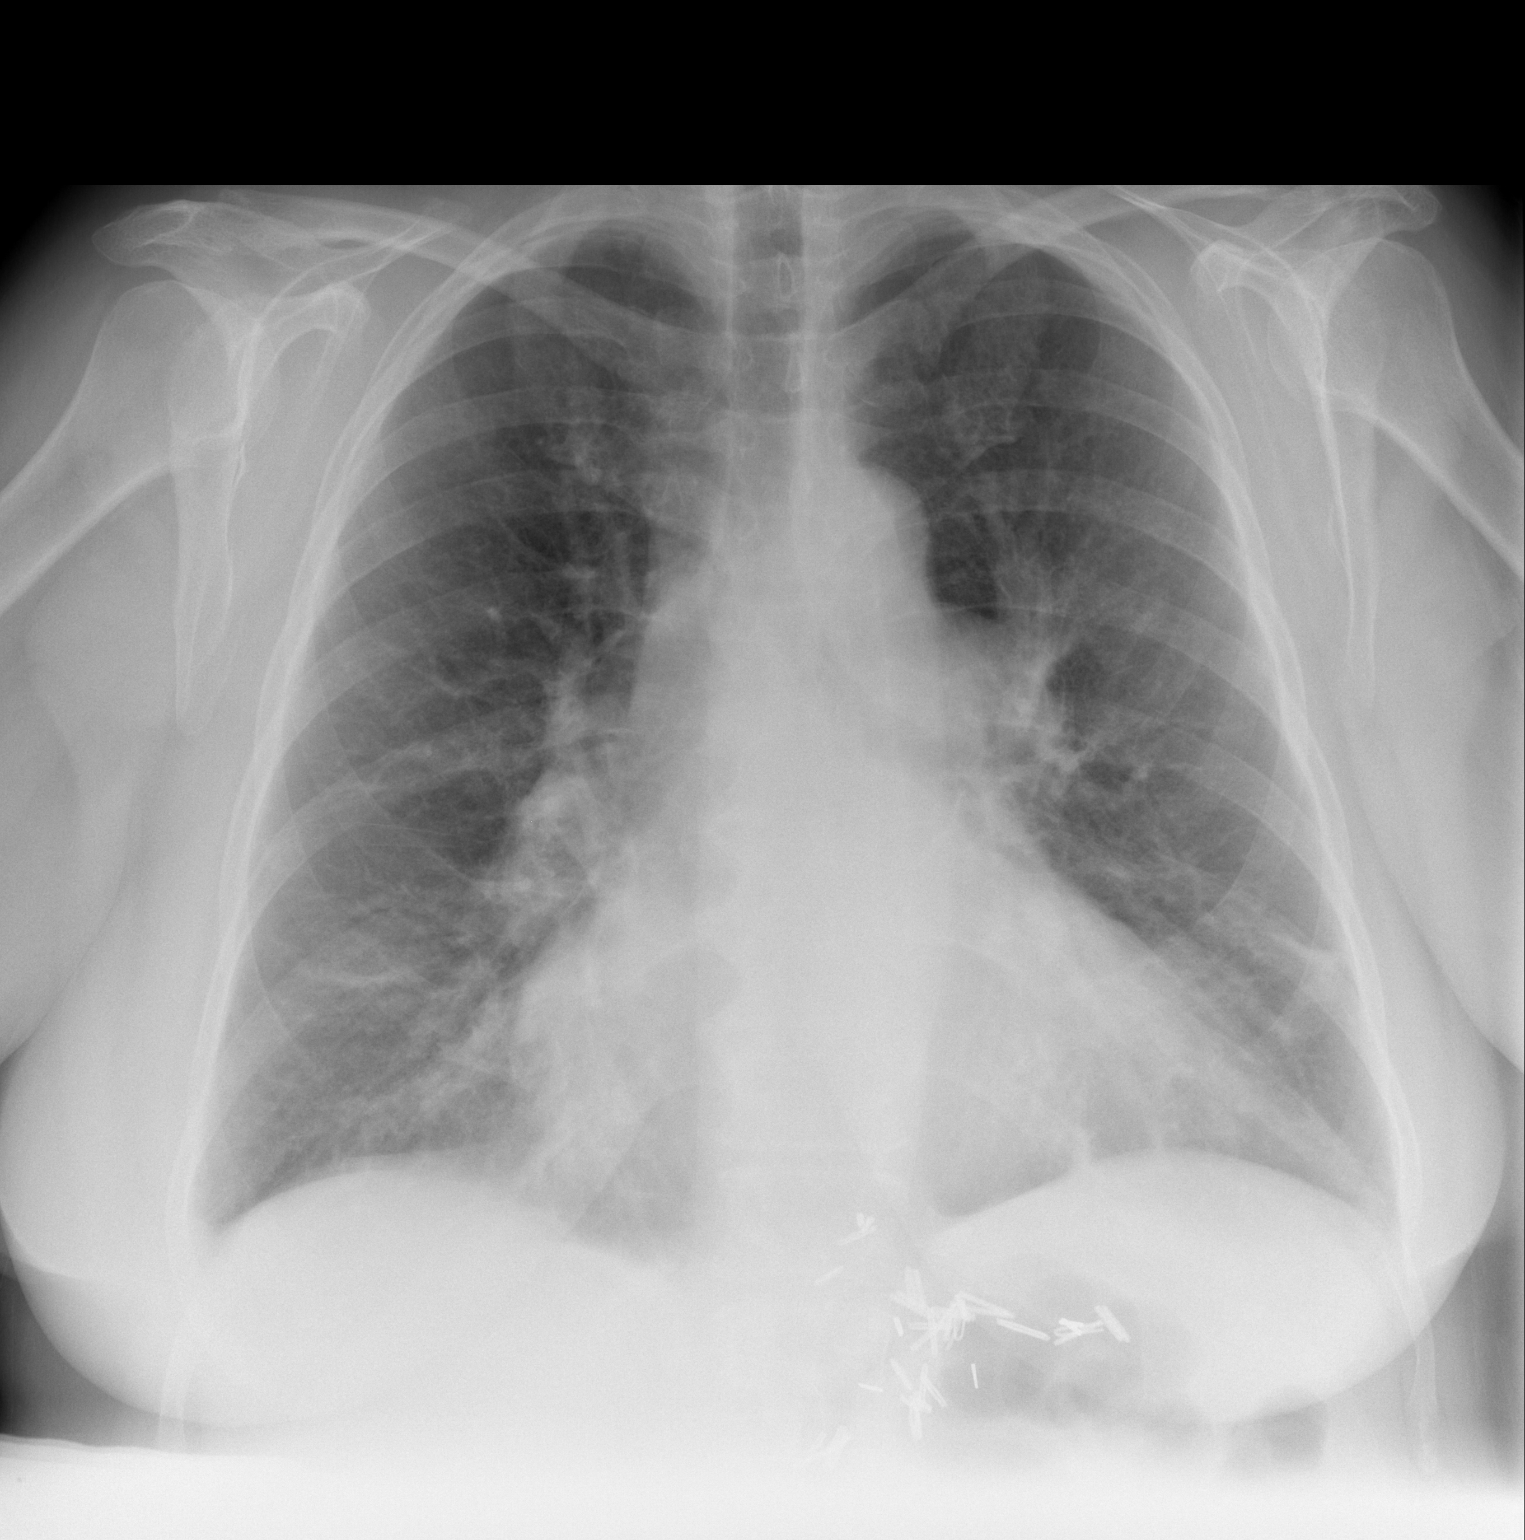

[w chest lat *]
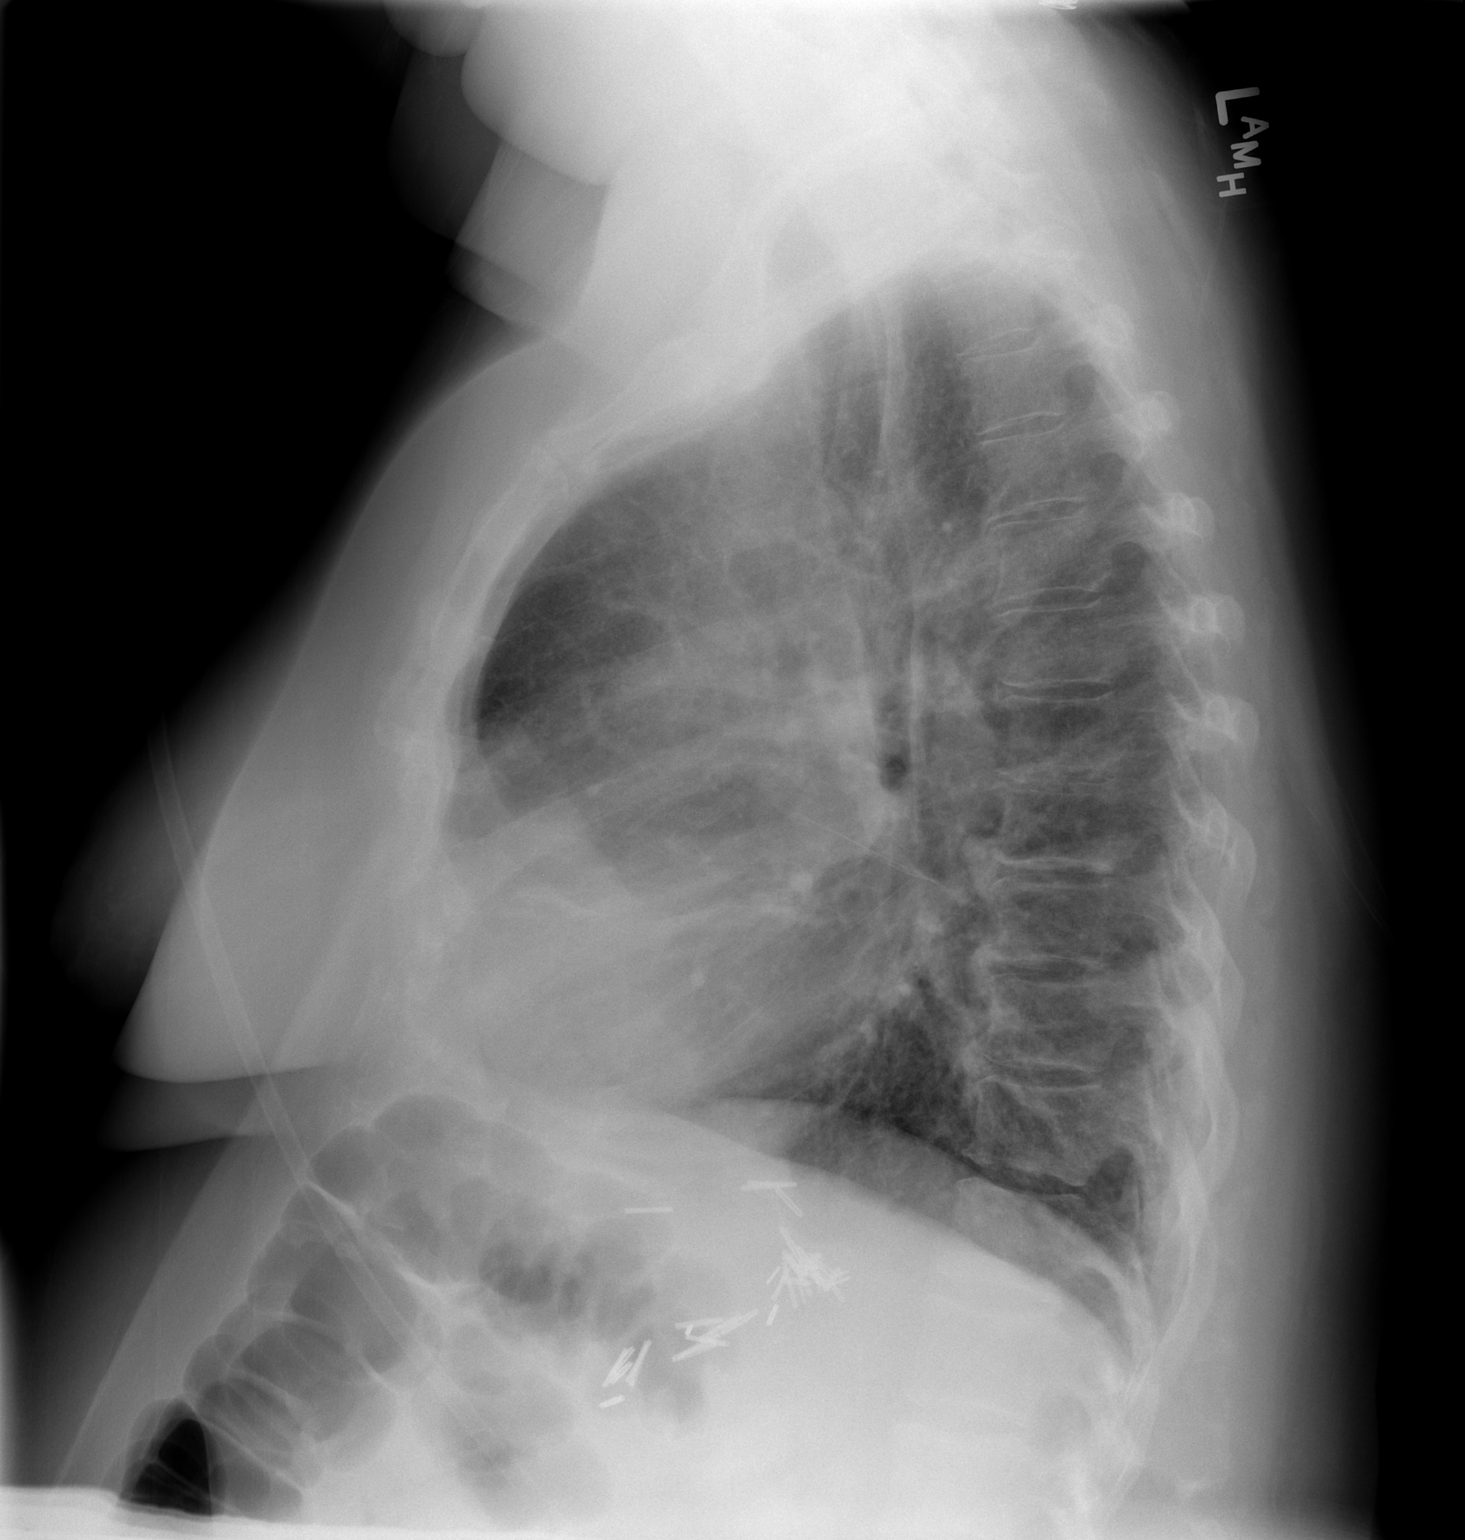

[2 of 2 positions shown; findings below may reference images not displayed]

FINDINGS: The heart is enlarged but stable.  The mediastinal and
hilar contours are slightly prominent but unchanged.  Chronic
bronchitic type lung changes but no acute pulmonary infiltrates or
effusions.  Streaky bibasilar atelectasis is noted.
IMPRESSION: 1.  Stable cardiac enlargement.
2.  Chronic bronchitic type lung changes and streaky areas of
atelectasis.

## 2013-04-16 NOTE — Addendum Note (Signed)
Addended by: Ebbie Latus on: 04/16/2013 08:57 AM   Modules accepted: Orders

## 2013-04-17 ENCOUNTER — Encounter: Payer: Medicare Other | Admitting: Internal Medicine

## 2013-04-18 ENCOUNTER — Encounter: Payer: Medicare Other | Admitting: Internal Medicine

## 2013-04-23 ENCOUNTER — Encounter: Payer: Self-pay | Admitting: Internal Medicine

## 2013-04-24 ENCOUNTER — Other Ambulatory Visit: Payer: Self-pay | Admitting: Licensed Clinical Social Worker

## 2013-04-24 ENCOUNTER — Encounter: Payer: Self-pay | Admitting: Licensed Clinical Social Worker

## 2013-04-24 ENCOUNTER — Encounter: Payer: Self-pay | Admitting: Internal Medicine

## 2013-04-24 ENCOUNTER — Ambulatory Visit (INDEPENDENT_AMBULATORY_CARE_PROVIDER_SITE_OTHER): Payer: Medicare HMO | Admitting: Internal Medicine

## 2013-04-24 VITALS — BP 120/80 | HR 68 | Temp 98.0°F | Ht 65.0 in | Wt 242.5 lb

## 2013-04-24 DIAGNOSIS — I2789 Other specified pulmonary heart diseases: Secondary | ICD-10-CM

## 2013-04-24 DIAGNOSIS — Z598 Other problems related to housing and economic circumstances: Secondary | ICD-10-CM

## 2013-04-24 DIAGNOSIS — R7309 Other abnormal glucose: Secondary | ICD-10-CM

## 2013-04-24 DIAGNOSIS — I2510 Atherosclerotic heart disease of native coronary artery without angina pectoris: Secondary | ICD-10-CM

## 2013-04-24 DIAGNOSIS — J449 Chronic obstructive pulmonary disease, unspecified: Secondary | ICD-10-CM

## 2013-04-24 DIAGNOSIS — J9610 Chronic respiratory failure, unspecified whether with hypoxia or hypercapnia: Secondary | ICD-10-CM

## 2013-04-24 DIAGNOSIS — G4730 Sleep apnea, unspecified: Secondary | ICD-10-CM

## 2013-04-24 DIAGNOSIS — J4489 Other specified chronic obstructive pulmonary disease: Secondary | ICD-10-CM

## 2013-04-24 DIAGNOSIS — I5032 Chronic diastolic (congestive) heart failure: Secondary | ICD-10-CM

## 2013-04-24 DIAGNOSIS — F17200 Nicotine dependence, unspecified, uncomplicated: Secondary | ICD-10-CM

## 2013-04-24 DIAGNOSIS — Z599 Problem related to housing and economic circumstances, unspecified: Secondary | ICD-10-CM

## 2013-04-24 DIAGNOSIS — Z0000 Encounter for general adult medical examination without abnormal findings: Secondary | ICD-10-CM

## 2013-04-24 LAB — GLUCOSE, CAPILLARY: Glucose-Capillary: 113 mg/dL — ABNORMAL HIGH (ref 70–99)

## 2013-04-24 LAB — POCT GLYCOSYLATED HEMOGLOBIN (HGB A1C): Hemoglobin A1C: 5.6

## 2013-04-24 NOTE — Assessment & Plan Note (Signed)
A few previous labs indicate elevated blood glucose. However, this might have been due to the steroids, which she was taking during her recent hospitalization for COPD exacerbation. Will need a repeat A1c today in order to exclude diabetes.

## 2013-04-24 NOTE — Assessment & Plan Note (Signed)
Symptoms are stable.  Unable to afford her medications, including Dulera.  Plan -Continue with home oxygen. -Will work on transportation issues before scheduling an appointment with pulmonary rehabilitation. - Continue with albuterol inhaler when necessary.

## 2013-04-24 NOTE — Assessment & Plan Note (Signed)
Patient reports that she is claustrophobic and that she would never use a use a mask. She further mentioned that it was not easy to get used to the canula O2 due to this claustrophobia. She also confirmed that she has never had a sleep study before.  Plan. -No need for sleep study if patient will not tolerate a mask. -Advised the patient instead focus on weight loss.

## 2013-04-24 NOTE — Assessment & Plan Note (Signed)
Not ready to quit. Provided information about the risks of smoking.

## 2013-04-24 NOTE — Patient Instructions (Addendum)
General Instructions: We will focus on trying to get you transportation and after that we can get your pending doctors' visits set up  Please take your medications as before We will check for diabetes today with a test called A1c and I will call you with the results.  Please come back in 1 month   Treatment Goals:  Goals (1 Years of Data) as of 04/24/13         As of Today     Lifestyle    . Quit smoking / using tobacco  No      Progress Toward Treatment Goals:  Treatment Goal 04/24/2013  Stop smoking smoking the same amount  Prevent falls unchanged    Self Care Goals & Plans:  Self Care Goal 04/24/2013  Manage my medications take my medicines as prescribed; bring my medications to every visit; refill my medications on time  Eat healthy foods -  Be physically active -  Stop smoking -    No flowsheet data found.   Care Management & Community Referrals:  Referral 01/09/2013  Referrals made for care management support -  Referrals made to community resources noncommercial health insurance options

## 2013-04-24 NOTE — Assessment & Plan Note (Signed)
Symptoms are stable. Will consider referral to pulmonary rehabilitation once transportation issue has been sorted out. Discussed with Education officer, museum , who referred the patient to [facility].

## 2013-04-24 NOTE — Progress Notes (Signed)
Ms. Biffle was referred to CSW as transportation has been a barrier for [name] and attending her medical appointments.  Pt states she has to rely on others for transportation.  CSW discussed SCAT, pt states she didn't she would qualify.  CSW encouraged pt to apply, as it would present a difficulty for pt to utilized the fixed route with her O2 tank.  In addition, Ms. Whisonant states her portable O2 tank only last 2 hours and the one pt is using does not have a setting for 4L.  Ms. Sampsel states her other portable tank has a slow leak and does not last 2 hours.  Pt states she can not afford to have [facility] charge her for another tank.  CSW discussed referral to [facility] to provide Ms. Degroff with additional support in the community to help address barriers.  Pt in agreement and CSW confirmed phone number and address.  Ms. Stegner provided with Part A of the SCAT application and a SASE to return application.  Once, CSW receives Part A, will forward Part B to PCP.

## 2013-04-24 NOTE — Progress Notes (Signed)
Patient ID: [name], female   DOB: May 11, 1955, 58 y.o.   MRN: [id number]   Subjective:   HPI: [name] is a 58 y.o. woman with past medical history of chronic respiratory failure due to COPD, on home oxygen 4 L per minute, morbidly obese, coronary artery disease, dCHF, and obstructive sleep apnea, PAH who presents for a routine follow up visit.    Patient was admitted last month for COPD exacerbation. She reports that she feels well today. She has been unable to afford her [facility] or Flovent. However, she reports, that she uses albuterol inhaler only 1 to 2 times a day. She denies any cough, wheezing, chest pain, or increased shortness of breath. She is compliant with her home oxygen.  Significantly, patient has issues with transportation, and as a, result she's been unable to attend her pulmonary rehabilitation. She was also unable to attend her colonoscopy appointment and mammogram. She lives with a friend and a son who lives out of town for transportation to clinic appointments. This is the main obstacle for the patient's health care in addition to medication cost. I discussed the issue with our social worker, who has referred the patient to [facility] for assistance with transportation.    Kindly see the A&P for the status of the pt's chronic medical problems.    Past Medical History  Diagnosis Date  . Coronary artery disease     S/P PCI of LAD with DES (12/2008). Total occlusion of RCA noted at that time., medically managed. ACS ruled out 03/2009 with Lexiscan myoview . Followed by [facility].  . Pulmonary hypertension     2-D Echo (40/9811) - Systolic pressure was moderately increased. PA peak pressure  30mHg. 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 Followed by Dr. WJoya Gaskins(Velora Heckler  . Diastolic dysfunction     2-D Echo (12/2008) - Normal LV Systolic funciton with EF  60-65%. Grade 1 diastolid dysfunction. No regional wall motion abnormalities. Moderate pulmonary HTN with PA peak pressure 543mg.  . Marland KitchenOPD (chronic obstructive pulmonary disease)     Severe. Gold Stage IV.  PFTs (12/2008) - severe obstructive airway disease. Active tobacco use. Requires 4L O2 at home.  . Pulmonary nodule, right     Small right middle lobe nodule. Stable as of 12/2008.  . Marland Kitchenleep apnea     Presumptive. No documented sleep studies.   . Prediabetes 12/2008    HgbA1c 6.4 (12/2008)  . Hx MRSA infection     Recurrent MRSA thigh abscesses.  . Tobacco abuse     Ongoing.  . Obesity   . Hyperlipidemia   . GERD (gastroesophageal reflux disease)     S/P Nissen fundoplication.  . Shortness of breath   . CHF (congestive heart failure)    Current Outpatient Prescriptions  Medication Sig Dispense Refill  . albuterol (PROVENTIL HFA;VENTOLIN HFA) 108 (90 BASE) MCG/ACT inhaler Inhale 1 puff into the lungs every 4 (four) hours as needed for wheezing or shortness of breath.  1 Inhaler  11  . aspirin 81 MG chewable tablet Chew 1 tablet (81 mg total) by mouth daily.  30 tablet  11  . furosemide (LASIX) 20 MG tablet Take 2 tablets (40 mg total) by mouth every morning.  90 tablet  11  . lovastatin (MEVACOR) 20 MG tablet Take 1 tablet (20 mg total) by mouth at bedtime.  30 tablet  11  .  cyclobenzaprine (FLEXERIL) 5 MG tablet Take 1 tablet (5 mg total) by mouth 3 (three) times daily as needed for muscle spasms.  15 tablet  0  . fluticasone (FLOVENT HFA) 110 MCG/ACT inhaler Inhale 1 puff into the lungs 2 (two) times daily.      Marland Kitchen ipratropium (ATROVENT) 0.02 % nebulizer solution Take 2.5 mLs (0.5 mg total) by nebulization every 4 (four) hours as needed for wheezing.  75 mL  2  . mometasone-formoterol (DULERA) 100-5 MCG/ACT AERO Inhale 2 puffs into the lungs.       No current facility-administered medications for this visit.   Family History  Problem Relation Age of Onset  . Heart disease Mother  25    Deceased from MI at 64yo  . Hypertension Mother   . Heart disease Father 33    Deceased of MI age 36yo  . Hypertension Father   . Hypertension Brother   . Lung cancer      Grandmother   History   Social History  . Marital Status: Single    Spouse Name: N/A    Number of Children: N/A  . Years of Education: N/A   Social History Main Topics  . Smoking status: Current Every Day Smoker -- 0.50 packs/day for 40 years    Types: Cigarettes  . Smokeless tobacco: Never Used  . Alcohol Use: No  . Drug Use: No  . Sexual Activity: None   Other Topics Concern  . None   Social History Narrative   Formerly worked as a Scientist, water quality, now disabled.   Divorced.   2 grown children.   Lives with her grandson.   Review of Systems: Constitutional: Denies fever, chills, diaphoresis, appetite change and fatigue.  Cardiovascular: No chest pain, palpitations. She denies leg swelling.  Gastrointestinal:reports abdominal pain - due to coughing. Denies Nausea, vomiting, bloody stools Genitourinary: No dysuria, frequency, hematuria, or flank pain.  Musculoskeletal: denies increased leg edema  Psych: No depression symptoms. No SI or SA.   Objective:  Physical Exam: Filed Vitals:   04/24/13 0914  BP: 120/80  Pulse: 68  Temp: 98 F (36.7 C)  TempSrc: Oral  Height: _0  (1.651 m)  Weight: 242 lb 8 oz (109.997 kg)  SpO2: 98%   General: Obese.  MMM. On 4/L and and not in acute distress, she looks comfortable  Lungs: normal work of breathing. No wheezing.  HEENT: marginal airway but not exudates or hyperemia of the oral pharynx.  Heart: RRR; no extra sounds or murmurs  Abdomen: Non-distended, normal BS, soft, nontender; no hepatosplenomegaly  Extremities:bilateral pedal edema to level of knees. No joint swelling or tenderness. Neurologic: Normal EOM,  Alert and oriented x3. No obvious neurologic/cranial nerve deficits.  Assessment & Plan:  I have discussed my assessment and plan  with  my  attending in the clinic, Dr. Lynnae January  as detailed under problem based charting.

## 2013-04-25 NOTE — Progress Notes (Signed)
Case discussed with Dr. Kazibwe soon after the resident saw the patient.  We reviewed the resident's history and exam and pertinent patient test results.  I agree with the assessment, diagnosis, and plan of care documented in the resident's note. 

## 2013-05-13 ENCOUNTER — Emergency Department (HOSPITAL_COMMUNITY): Payer: Medicare HMO

## 2013-05-13 ENCOUNTER — Encounter: Payer: Self-pay | Admitting: Licensed Clinical Social Worker

## 2013-05-13 ENCOUNTER — Emergency Department (HOSPITAL_COMMUNITY)
Admission: EM | Admit: 2013-05-13 | Discharge: 2013-05-14 | Disposition: A | Discharge status: Home / Self Care | Payer: Medicare HMO | Attending: Emergency Medicine | Admitting: Emergency Medicine

## 2013-05-13 ENCOUNTER — Encounter (HOSPITAL_COMMUNITY): Payer: Self-pay | Admitting: Emergency Medicine

## 2013-05-13 DIAGNOSIS — Z7982 Long term (current) use of aspirin: Secondary | ICD-10-CM | POA: Insufficient documentation

## 2013-05-13 DIAGNOSIS — Z8614 Personal history of Methicillin resistant Staphylococcus aureus infection: Secondary | ICD-10-CM | POA: Insufficient documentation

## 2013-05-13 DIAGNOSIS — R259 Unspecified abnormal involuntary movements: Secondary | ICD-10-CM | POA: Insufficient documentation

## 2013-05-13 DIAGNOSIS — E785 Hyperlipidemia, unspecified: Secondary | ICD-10-CM | POA: Insufficient documentation

## 2013-05-13 DIAGNOSIS — IMO0002 Reserved for concepts with insufficient information to code with codable children: Secondary | ICD-10-CM | POA: Insufficient documentation

## 2013-05-13 DIAGNOSIS — Z8719 Personal history of other diseases of the digestive system: Secondary | ICD-10-CM | POA: Insufficient documentation

## 2013-05-13 DIAGNOSIS — R079 Chest pain, unspecified: Secondary | ICD-10-CM | POA: Insufficient documentation

## 2013-05-13 DIAGNOSIS — J111 Influenza due to unidentified influenza virus with other respiratory manifestations: Secondary | ICD-10-CM | POA: Insufficient documentation

## 2013-05-13 DIAGNOSIS — I2789 Other specified pulmonary heart diseases: Secondary | ICD-10-CM | POA: Insufficient documentation

## 2013-05-13 DIAGNOSIS — E669 Obesity, unspecified: Secondary | ICD-10-CM | POA: Insufficient documentation

## 2013-05-13 DIAGNOSIS — R002 Palpitations: Secondary | ICD-10-CM | POA: Insufficient documentation

## 2013-05-13 DIAGNOSIS — J441 Chronic obstructive pulmonary disease with (acute) exacerbation: Secondary | ICD-10-CM | POA: Insufficient documentation

## 2013-05-13 DIAGNOSIS — F17200 Nicotine dependence, unspecified, uncomplicated: Secondary | ICD-10-CM | POA: Insufficient documentation

## 2013-05-13 DIAGNOSIS — R202 Paresthesia of skin: Secondary | ICD-10-CM

## 2013-05-13 DIAGNOSIS — R69 Illness, unspecified: Secondary | ICD-10-CM

## 2013-05-13 DIAGNOSIS — I509 Heart failure, unspecified: Secondary | ICD-10-CM | POA: Insufficient documentation

## 2013-05-13 DIAGNOSIS — Z79899 Other long term (current) drug therapy: Secondary | ICD-10-CM | POA: Insufficient documentation

## 2013-05-13 DIAGNOSIS — R209 Unspecified disturbances of skin sensation: Secondary | ICD-10-CM | POA: Insufficient documentation

## 2013-05-13 DIAGNOSIS — I2510 Atherosclerotic heart disease of native coronary artery without angina pectoris: Secondary | ICD-10-CM | POA: Insufficient documentation

## 2013-05-13 LAB — CBC
HCT: 42.8 % (ref 36.0–46.0)
Hemoglobin: 13.6 g/dL (ref 12.0–15.0)
MCH: 30.2 pg (ref 26.0–34.0)
MCHC: 31.8 g/dL (ref 30.0–36.0)
MCV: 95.1 fL (ref 78.0–100.0)
PLATELETS: 134 10*3/uL — AB (ref 150–400)
RBC: 4.5 MIL/uL (ref 3.87–5.11)
RDW: 13 % (ref 11.5–15.5)
WBC: 6.8 10*3/uL (ref 4.0–10.5)

## 2013-05-13 LAB — COMPREHENSIVE METABOLIC PANEL
ALT: 9 U/L (ref 0–35)
AST: 14 U/L (ref 0–37)
Albumin: 3.4 g/dL — ABNORMAL LOW (ref 3.5–5.2)
Alkaline Phosphatase: 88 U/L (ref 39–117)
BUN: 12 mg/dL (ref 6–23)
CALCIUM: 9.4 mg/dL (ref 8.4–10.5)
CO2: 42 mEq/L (ref 19–32)
CREATININE: 0.66 mg/dL (ref 0.50–1.10)
Chloride: 94 mEq/L — ABNORMAL LOW (ref 96–112)
GFR calc non Af Amer: >90 mL/min (ref >90)
Glucose, Bld: 88 mg/dL (ref 70–99)
Potassium: 4.4 mEq/L (ref 3.7–5.3)
Sodium: 143 mEq/L (ref 137–147)
TOTAL PROTEIN: 7 g/dL (ref 6.0–8.3)
Total Bilirubin: 0.4 mg/dL (ref 0.3–1.2)

## 2013-05-13 LAB — POCT I-STAT TROPONIN I: Troponin i, poc: 0 ng/mL (ref 0.00–0.08)

## 2013-05-13 LAB — POCT I-STAT, CHEM 8
BUN: 15 mg/dL (ref 6–23)
CHLORIDE: 89 meq/L — AB (ref 96–112)
Calcium, Ion: 1.2 mmol/L (ref 1.12–1.23)
Creatinine, Ser: 0.9 mg/dL (ref 0.50–1.10)
GLUCOSE: 106 mg/dL — AB (ref 70–99)
HCT: 43 % (ref 36.0–46.0)
HEMOGLOBIN: 14.6 g/dL (ref 12.0–15.0)
POTASSIUM: 4.3 meq/L (ref 3.7–5.3)
SODIUM: 139 meq/L (ref 137–147)
TCO2: 45 mmol/L (ref 0–100)

## 2013-05-13 LAB — PRO B NATRIURETIC PEPTIDE: Pro B Natriuretic peptide (BNP): 167.7 pg/mL — ABNORMAL HIGH (ref 0–125)

## 2013-05-13 MED ORDER — ALBUTEROL SULFATE (2.5 MG/3ML) 0.083% IN NEBU
5.0000 mg | INHALATION_SOLUTION | Freq: Once | RESPIRATORY_TRACT | Status: AC
  Administered 2013-05-13: 5 mg via RESPIRATORY_TRACT
  Filled 2013-05-13: qty 6

## 2013-05-13 MED ORDER — IPRATROPIUM BROMIDE 0.02 % IN SOLN
0.5000 mg | Freq: Once | RESPIRATORY_TRACT | Status: AC
  Administered 2013-05-13: 0.5 mg via RESPIRATORY_TRACT
  Filled 2013-05-13: qty 2.5

## 2013-05-13 NOTE — ED Provider Notes (Signed)
CSN: [id number]     Arrival date & time 05/13/13  1339 History   First MD Initiated Contact with Patient 05/13/13 2204     Chief Complaint  Patient presents with  . Shortness of Breath     (Consider location/radiation/quality/duration/timing/severity/associated sxs/prior Treatment) HPI Comments: Patient is a 58 year old female with a history of COPD, CHF, diastolic dysfunction, pulmonary hypertension, and coronary artery disease s/p PCI of LAD in 2010 who presents to the emergency department for shortness of breath x2 days. Patient is on 4 L chronic home O2 [place] she has been short of breath despite oxygen use. She also states that symptoms have been associated with central chest palpitations described as a "fluttering in her chest", an intermittent fleeting left-sided facial numbness, and sporadic b/l hand and arm "jerking" x2 days. She states these symptoms come on and resolve spontaneously. She denies any modifying factors of her symptoms. Patient denies fever, hemoptysis, cough, syncope/near syncope, vomiting, abdominal pain, and extremity weakness.  Patient with echo in 11/2012 with EF of 55-60%  The history is provided by the patient. No language interpreter was used.    Past Medical History  Diagnosis Date  . Coronary artery disease     S/P PCI of LAD with DES (12/2008). Total occlusion of RCA noted at that time., medically managed. ACS ruled out 03/2009 with Lexiscan myoview . Followed by [place].  . Pulmonary hypertension     2-D Echo (00/8676) - Systolic pressure was moderately increased. PA peak pressure  40mHg. 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 Followed by Dr. WJoya Gaskins(Velora Heckler  . Diastolic dysfunction     2-D Echo (12/2008) - Normal LV Systolic funciton with EF 60-65%. Grade 1 diastolid dysfunction. No regional wall motion abnormalities. Moderate pulmonary HTN with PA peak  pressure 551mg.  . Marland KitchenOPD (chronic obstructive pulmonary disease)     Severe. Gold Stage IV.  PFTs (12/2008) - severe obstructive airway disease. Active tobacco use. Requires 4L O2 at home.  . Pulmonary nodule, right     Small right middle lobe nodule. Stable as of 12/2008.  . Marland Kitchenleep apnea     Presumptive. No documented sleep studies.   . Prediabetes 12/2008    HgbA1c 6.4 (12/2008)  . Hx MRSA infection     Recurrent MRSA thigh abscesses.  . Tobacco abuse     Ongoing.  . Obesity   . Hyperlipidemia   . GERD (gastroesophageal reflux disease)     S/P Nissen fundoplication.  . Shortness of breath   . CHF (congestive heart failure)    Past Surgical History  Procedure Laterality Date  . Total abdominal hysterectomy w/ bilateral salpingoophorectomy    . Nissen fundoplication     Family History  Problem Relation Age of Onset  . Heart disease Mother 4741  Deceased from MI at 59yo. Hypertension Mother   . Heart disease Father 5419  Deceased of MI age 59yo. Hypertension Father   . Hypertension Brother   . Lung cancer      Grandmother   History  Substance Use Topics  . Smoking status: Current Every Day Smoker -- 0.50 packs/day for 40 years    Types: Cigarettes  . Smokeless tobacco: Never Used  . Alcohol Use: No   OB History   Grav Para Term Preterm Abortions TAB SAB Ect Mult Living  Review of Systems  Constitutional: Negative for fever.  Respiratory: Positive for shortness of breath.   Cardiovascular: Positive for chest pain and palpitations.  Gastrointestinal: Negative for vomiting, abdominal pain and diarrhea.  Neurological: Negative for weakness.  All other systems reviewed and are negative.     Allergies  Fluconazole  Home Medications   Current Outpatient Rx  Name  Route  Sig  Dispense  Refill  . albuterol (PROVENTIL HFA;VENTOLIN HFA) 108 (90 BASE) MCG/ACT inhaler   Inhalation   Inhale 1 puff into the lungs every 4 (four) hours as needed  for wheezing or shortness of breath.   1 Inhaler   11   . Ascorbic Acid Buffered (BUFFERED C POWDER PO)   Oral   Take 1 packet by mouth as needed (headache).         Marland Kitchen aspirin 81 MG chewable tablet   Oral   Chew 1 tablet (81 mg total) by mouth daily.   30 tablet   11   . furosemide (LASIX) 20 MG tablet   Oral   Take 2 tablets (40 mg total) by mouth every morning.   90 tablet   11   . ipratropium (ATROVENT) 0.02 % nebulizer solution   Nebulization   Take 2.5 mLs (0.5 mg total) by nebulization every 4 (four) hours as needed for wheezing.   75 mL   2   . lovastatin (MEVACOR) 20 MG tablet   Oral   Take 1 tablet (20 mg total) by mouth at bedtime.   30 tablet   11   . mometasone-formoterol (DULERA) 100-5 MCG/ACT AERO   Inhalation   Inhale 2 puffs into the lungs.          BP 165/72  Pulse 81  Temp(Src) 98.1 F (36.7 C) (Oral)  Resp 21  SpO2 95%   Physical Exam  Nursing note and vitals reviewed. Constitutional: She is oriented to person, place, and time. She appears well-developed and well-nourished. No distress.  HENT:  Head: Normocephalic and atraumatic.  Eyes: Conjunctivae and EOM are normal. Pupils are equal, round, and reactive to light. No scleral icterus.  Neck: Normal range of motion.  Cardiovascular: Normal rate, regular rhythm and normal heart sounds.   Pulmonary/Chest: Effort normal. No respiratory distress. She has no wheezes. She has no rales.  Decreased breath sounds diffusely. Poor effort. No retractions or accessory muscle use. Symmetric chest expansion.  Abdominal: Soft. There is no tenderness. There is no rebound and no guarding.  Musculoskeletal: Normal range of motion.  Neurological: She is alert and oriented to person, place, and time.  No focal neurologic deficits appreciated. No facial drooping. No gross sensory deficits appreciated. Patient speaks in full, unbroken, goal oriented sentences. GCS 15.  Skin: Skin is warm and dry. No rash  noted. She is not diaphoretic. No erythema. No pallor.  Psychiatric: She has a normal mood and affect. Her behavior is normal.     ED Course  Procedures (including critical care time) Labs Review Labs Reviewed  CBC - Abnormal; Notable for the following:    Platelets 134 (*)    All other components within normal limits  COMPREHENSIVE METABOLIC PANEL - Abnormal; Notable for the following:    Chloride 94 (*)    CO2 42 (*)    Albumin 3.4 (*)    All other components within normal limits  PRO B NATRIURETIC PEPTIDE - Abnormal; Notable for the following:    Pro B Natriuretic peptide (BNP) 167.7 (*)  All other components within normal limits  POCT I-STAT, CHEM 8 - Abnormal; Notable for the following:    Chloride 89 (*)    Glucose, Bld 106 (*)    All other components within normal limits  POCT I-STAT TROPONIN I   Imaging Review Dg Chest 2 View  05/14/2013   CLINICAL DATA:  Short of breath.  Right-sided chest pain.  EXAM: CHEST  2 VIEW  COMPARISON:  DG CHEST 2 VIEW dated 05/13/2013  FINDINGS: There is no interval change compared to the recent prior examination. Cardiomegaly and bilateral basilar predominant interstitial and alveolar opacity is present, most compatible with CHF. Blunting of both costophrenic angles is present compatible with tiny pleural effusions. Enlargement of the pulmonary arteries suggesting pulmonary arterial hypertension.  The study has nipple markers. There are no pulmonary nodules identified.  IMPRESSION: No interval change in the chest. Findings remain compatible with CHF. No discrete pulmonary nodules identified.   Electronically Signed   By: Dereck Ligas M.D.   On: 05/14/2013 01:00   Dg Chest 2 View  05/13/2013   CLINICAL DATA:  Chest pain and shortness of breath  EXAM: CHEST  2 VIEW  COMPARISON:  March 12, 2013  FINDINGS: There is a small area of patchy infiltrate in the lateral left base. There is no frank edema or consolidation elsewhere. There is an apparent  nipple shadow on the right. Heart is enlarged with pulmonary vascularity within normal limits. No adenopathy. No bone lesions. There is degenerative change in the thoracic spine. There are surgical clips in the upper abdomen.  IMPRESSION: Small area of patchy infiltrate left base. Probable nipple shadow on the right. Repeat study with nipple markers to confirm that this opacity indeed represents a nipple shadow would be advisable.  Stable cardiomegaly.   Electronically Signed   By: Lowella Grip M.D.   On: 05/13/2013 14:48    EKG Interpretation    Date/Time:  Monday May 13 2013 14:17:11 EST Ventricular Rate:  81 PR Interval:  132 QRS Duration: 80 QT Interval:  382 QTC Calculation: 443 R Axis:   89 Text Interpretation:  Normal sinus rhythm with sinus arrhythmia Normal ECG ED PHYSICIAN INTERPRETATION AVAILABLE IN [facility] Confirmed by TEST, RECORD ([id number]) on 05/15/2013 12:07:45 PM            MDM   Final diagnoses:  Influenza-like illness  Paresthesias    2315 - Patient presenting for mixed picture including shortness of breath, central chest palpitations, an intermittent fleeting left-sided facial numbness, and sporadic b/l hand and arm "jerking" x2 days. Symptoms are all intermittent. Patient is well and nontoxic appearing, hemodynamically stable, and afebrile. Neurologic exam today is nonfocal. Breath sounds decreased diffusely and patient has poor effort on exam. Otherwise, physical exam unremarkable. Initial chest x-ray shows small area of patchy infiltrate in the left lung base as well as probable nipple shadow on the right. Given symptoms of shortness of breath and x-ray findings, will repeat with nipple markers.  0110 - Have consulted with radiologist regarding repeat CXR; Dr. Gerilyn Pilgrim confirms no evidence of focal consolidation, infiltrate, or evidence of PNA. Though radiologist reads findings c/w with CHF do not believe clinical picture suggests acute CHF  exacerbation today. Patient does not appear to be fluid overloaded. She also has lowest BNP today since 2 years ago. No weight gain. Have discussed plan with Dr. Tawnya Crook who believe beneficial to treat patient for flu today given symptoms. Despite vitals, patient has been satting between 91-93% on  her chronic 4L home O2 on every encounter without dypsnea or tachypnea; this appears c/w her baseline as compared to her prior visits and vitals on last inpatient discharge. Believe patient hemodynamically stable and appropriate for discharge today. Tamiflu prescribed. Patient seen and evaluated also by Dr. Ernestina Patches who is in agreement with this workup, assessment, management plan, and patient's stability for discharge.     Antonietta Breach, PA-C 05/17/13 8264  Medical screening examination/treatment/procedure(s) were performed by non-physician practitioner and as supervising physician I was immediately available for consultation/collaboration.  EKG Interpretation    Date/Time:  Monday May 13 2013 14:17:11 EST Ventricular Rate:  81 PR Interval:  132 QRS Duration: 80 QT Interval:  382 QTC Calculation: 443 R Axis:   89 Text Interpretation:  Normal sinus rhythm with sinus arrhythmia Normal ECG ED PHYSICIAN INTERPRETATION AVAILABLE IN [facility] Confirmed by TEST, RECORD ([id number]) on 05/15/2013 12:07:45 PM              Neta Ehlers, MD 05/17/13 [phone]

## 2013-05-13 NOTE — ED Notes (Signed)
CRITICAL VALUE ALERT  Critical value received:  CO2 42  Date of notification:  05/13/2013  Time of notification:  0177  Critical value read back:yes  Nurse who received alert:  MG Braulio Conte, RN  MD notified (1st page):  Dr. Maryan Rued  Time of first page:  1544  MD notified (2nd page):  Time of second page:  Responding MD: Dr. Maryan Rued  Time MD responded:  [phone]

## 2013-05-13 NOTE — ED Notes (Signed)
Pt given graham crackers and drink.  Updated on plan of care.  Pt voices understanding.

## 2013-05-13 NOTE — Progress Notes (Signed)
Patient ID: [name], female   DOB: 12-Sep-1955, 58 y.o.   MRN: [id number] Pt returned Part A of her [facility] application.  CSW has placed PART B in PCP mailbox.  Once received back from PCP, CSW will fax to [facility].

## 2013-05-13 NOTE — ED Notes (Addendum)
Face numb for 2 days and increasing sob and arms jerking x 2 days pt is home o2 at 4 liters feels fluttering in the chest states that she has only had 2 cigarettes today

## 2013-05-14 ENCOUNTER — Emergency Department (HOSPITAL_COMMUNITY): Payer: Medicare HMO

## 2013-05-14 MED ORDER — OSELTAMIVIR PHOSPHATE 75 MG PO CAPS: 75.0000 mg | ORAL_CAPSULE | Freq: Once | ORAL | Status: DC

## 2013-05-14 MED ORDER — OSELTAMIVIR PHOSPHATE 75 MG PO CAPS: 75.0000 mg | ORAL_CAPSULE | Freq: Two times a day (BID) | ORAL | Status: DC

## 2013-05-14 MED ORDER — OSELTAMIVIR PHOSPHATE 75 MG PO CAPS
75.0000 mg | ORAL_CAPSULE | Freq: Once | ORAL | Status: DC
  Filled 2013-05-14: qty 1

## 2013-05-14 NOTE — ED Provider Notes (Addendum)
1:17 AM Pt presents w/ several days of illness beginning w/ myalgias & chills, now w/ cough & SOB.  On PE, pt afebrile, in NAD.  No significant wheezing or crackles on pulm exam.  Minimal BLLE edema.  CXR w/ possible opacity vs nipple shadow.  Will repeat.    Repeat CXR not c/w pna.  Suspect influenza.  In reviewing records O2 stats stable from prior. Pt has ambulated in dept.  As pt does not appear fluid overloaded, dehydrated or toxic appearing, I feel she is safe for d/c w/ Rx for tamiflu, close PCP f/u.   1. Influenza-like illness   2. Paresthesias      Neta Ehlers, MD 05/14/13 5396  Neta Ehlers, MD 05/17/13 7289

## 2013-05-14 NOTE — Discharge Instructions (Signed)
Influenza, Adult Influenza ("the flu") is a viral infection of the respiratory tract. It occurs more often in winter months because people spend more time in close contact with one another. Influenza can make you feel very sick. Influenza easily spreads from person to person (contagious). CAUSES  Influenza is caused by a virus that infects the respiratory tract. You can catch the virus by breathing in droplets from an infected person's cough or sneeze. You can also catch the virus by touching something that was recently contaminated with the virus and then touching your mouth, nose, or eyes. SYMPTOMS  Symptoms typically last 4 to 10 days and may include:  Fever.  Chills.  Headache, body aches, and muscle aches.  Sore throat.  Chest discomfort and cough.  Poor appetite.  Weakness or feeling tired.  Dizziness.  Nausea or vomiting. DIAGNOSIS  Diagnosis of influenza is often made based on your history and a physical exam. A nose or throat swab test can be done to confirm the diagnosis. RISKS AND COMPLICATIONS You may be at risk for a more severe case of influenza if you smoke cigarettes, have diabetes, have chronic heart disease (such as heart failure) or lung disease (such as asthma), or if you have a weakened immune system. Elderly people and pregnant women are also at risk for more serious infections. The most common complication of influenza is a lung infection (pneumonia). Sometimes, this complication can require emergency medical care and may be life-threatening. PREVENTION  An annual influenza vaccination (flu shot) is the best way to avoid getting influenza. An annual flu shot is now routinely recommended for all adults in the [place] TREATMENT  In mild cases, influenza goes away on its own. Treatment is directed at relieving symptoms. For more severe cases, your caregiver may prescribe antiviral medicines to shorten the sickness. Antibiotic medicines are not effective, because the  infection is caused by a virus, not by bacteria. HOME CARE INSTRUCTIONS  Only take over-the-counter or prescription medicines for pain, discomfort, or fever as directed by your caregiver.  Use a cool mist humidifier to make breathing easier.  Get plenty of rest until your temperature returns to normal. This usually takes 3 to 4 days.  Drink enough fluids to keep your urine clear or pale yellow.  Cover your mouth and nose when coughing or sneezing, and wash your hands well to avoid spreading the virus.  Stay home from work or school until your fever has been gone for at least 1 full day. SEEK MEDICAL CARE IF:   You have chest pain or a deep cough that worsens or produces more mucus.  You have nausea, vomiting, or diarrhea. SEEK IMMEDIATE MEDICAL CARE IF:   You have difficulty breathing, shortness of breath, or your skin or nails turn bluish.  You have severe neck pain or stiffness.  You have a severe headache, facial pain, or earache.  You have a worsening or recurring fever.  You have nausea or vomiting that cannot be controlled. MAKE SURE YOU:  Understand these instructions.  Will watch your condition.  Will get help right away if you are not doing well or get worse. Document Released: 03/18/2000 Document Revised: 09/20/2011 Document Reviewed: 06/20/2011 [facility] Patient Information 2014 [place], [place].

## 2013-05-14 NOTE — ED Notes (Signed)
Pt ambulatory to bathroom with 02 without any problems

## 2013-05-23 ENCOUNTER — Encounter: Payer: Self-pay | Admitting: Licensed Clinical Social Worker

## 2013-05-29 ENCOUNTER — Encounter: Payer: Medicare HMO | Admitting: Internal Medicine

## 2013-06-12 ENCOUNTER — Ambulatory Visit (INDEPENDENT_AMBULATORY_CARE_PROVIDER_SITE_OTHER): Payer: Medicare HMO | Admitting: Internal Medicine

## 2013-06-12 ENCOUNTER — Encounter: Payer: Self-pay | Admitting: Internal Medicine

## 2013-06-12 VITALS — BP 116/71 | HR 68 | Temp 99.0°F | Ht 65.0 in | Wt 243.6 lb

## 2013-06-12 DIAGNOSIS — Z0000 Encounter for general adult medical examination without abnormal findings: Secondary | ICD-10-CM

## 2013-06-12 DIAGNOSIS — J9610 Chronic respiratory failure, unspecified whether with hypoxia or hypercapnia: Secondary | ICD-10-CM

## 2013-06-12 DIAGNOSIS — E669 Obesity, unspecified: Secondary | ICD-10-CM

## 2013-06-12 DIAGNOSIS — F17200 Nicotine dependence, unspecified, uncomplicated: Secondary | ICD-10-CM

## 2013-06-12 DIAGNOSIS — I2789 Other specified pulmonary heart diseases: Secondary | ICD-10-CM

## 2013-06-12 DIAGNOSIS — J449 Chronic obstructive pulmonary disease, unspecified: Secondary | ICD-10-CM

## 2013-06-12 DIAGNOSIS — Z23 Encounter for immunization: Secondary | ICD-10-CM

## 2013-06-12 DIAGNOSIS — I2510 Atherosclerotic heart disease of native coronary artery without angina pectoris: Secondary | ICD-10-CM

## 2013-06-12 DIAGNOSIS — J984 Other disorders of lung: Secondary | ICD-10-CM

## 2013-06-12 DIAGNOSIS — E785 Hyperlipidemia, unspecified: Secondary | ICD-10-CM

## 2013-06-12 DIAGNOSIS — R7309 Other abnormal glucose: Secondary | ICD-10-CM

## 2013-06-12 MED ORDER — TIOTROPIUM BROMIDE MONOHYDRATE 18 MCG IN CAPS: 18.0000 ug | ORAL_CAPSULE | Freq: Every day | RESPIRATORY_TRACT | Status: DC

## 2013-06-12 MED ORDER — FUROSEMIDE 20 MG PO TABS: 40.0000 mg | ORAL_TABLET | ORAL | Status: DC

## 2013-06-12 MED ORDER — FLUTICASONE-SALMETEROL 250-50 MCG/DOSE IN AEPB
1.0000 | INHALATION_SPRAY | Freq: Two times a day (BID) | RESPIRATORY_TRACT | Status: DC
Start: 2013-06-12 — End: 2013-06-13

## 2013-06-12 MED ORDER — LOVASTATIN 20 MG PO TABS: 20.0000 mg | ORAL_TABLET | Freq: Every day | ORAL | Status: DC

## 2013-06-12 NOTE — Assessment & Plan Note (Addendum)
Stable today. She is now able to afford her inhalers however, she is hesitant about pulmonary rehabilitation.  Plan  - Start Spiriva,  And consider additional of Advair inhaler if her symptoms are worse.  - Continue with albuterol inhaler - Reiterated the need for pulmonary rehabilitation when she is ready and she has transportation.

## 2013-06-12 NOTE — Assessment & Plan Note (Signed)
Over the time, patient has been very hesitant in pursuing colonoscopy. I discussed risks and benefits of a colonoscopy for cancer screening as it has the potential for reducing morbidity and mortality.  However, I also went over risks of a colonoscopy given her comorbidities, especially her respiratory failure and oxygen dependence. I also discussed with her the option of colon cancer screening with other modalities including annual FOBT testing. Patient elected for annual FOBT screening in the place of a colonoscopy. Further risk stratification for the patient reveals no family history of colonic cancer. However, the patient is obese, and continues to smoke. This also has been discussed with the patient specifically mentioning to her that continuing cigarette smoking raises her risk for multiple cancers cancer, including colon cancer.Patient verbalized understanding. She is willing to have screening mammograms. Plan -Will not pursue colonoscopy per patient preference - Will continue with annual FOBT screening - Encouraged the patient to go for her mammograms - She'll get her Pap smear and a Pap smear clinic. - Given Tdap today.

## 2013-06-12 NOTE — Assessment & Plan Note (Signed)
Patient now ready to quit.

## 2013-06-12 NOTE — Assessment & Plan Note (Signed)
Start Spiriva , and Advair. Will follow up. Continue with home O2. I discussed the benefits of pulmonary rehabilitation , but the patient is not interested in trying it yet.

## 2013-06-12 NOTE — Patient Instructions (Signed)
Please start taking Spiriva and Advair  I strongly encourage you to consider Pulmonary rehab  Please use stool cards for screening of cancer of the colon Please follow up in 1 month

## 2013-06-12 NOTE — Progress Notes (Signed)
Patient ID: [name], female   DOB: Feb 28, 1956, 58 y.o.   MRN: [id number]  Subjective:   HPI: [name] is a 58 y.o. woman with past medical history of chronic respiratory failure due to COPD, on home oxygen 4 L per minute, morbidly obese, coronary artery disease, dCHF, and obstructive sleep apnea, PAH who presents for a routine follow up visit.   She otherwise feels well today. Recently, she obtained health insurance, and she now believes she can afford her inhalers. She also reports that she will be able to start utilizing a free, transportation service ([employer]) to doctor's appointments.  Kindly see the A&P for the status of the pt's chronic medical problems.    Past Medical History  Diagnosis Date  . Coronary artery disease     S/P PCI of LAD with DES (12/2008). Total occlusion of RCA noted at that time., medically managed. ACS ruled out 03/2009 with Lexiscan myoview . Followed by [place].  . Pulmonary hypertension     2-D Echo (49/8264) - Systolic pressure was moderately increased. PA peak pressure  69mHg. 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 Followed by Dr. WJoya Gaskins(Velora Heckler  . Diastolic dysfunction     2-D Echo (12/2008) - Normal LV Systolic funciton with EF 60-65%. Grade 1 diastolid dysfunction. No regional wall motion abnormalities. Moderate pulmonary HTN with PA peak pressure 536mg.  . Marland KitchenOPD (chronic obstructive pulmonary disease)     Severe. Gold Stage IV.  PFTs (12/2008) - severe obstructive airway disease. Active tobacco use. Requires 4L O2 at home.  . Pulmonary nodule, right     Small right middle lobe nodule. Stable as of 12/2008.  . Marland Kitchenleep apnea     Presumptive. No documented sleep studies.   . Prediabetes 12/2008    HgbA1c 6.4 (12/2008)  . Hx MRSA infection     Recurrent MRSA thigh abscesses.  . Tobacco abuse     Ongoing.  . Obesity   .  Hyperlipidemia   . GERD (gastroesophageal reflux disease)     S/P Nissen fundoplication.  . Shortness of breath   . CHF (congestive heart failure)    Current Outpatient Prescriptions  Medication Sig Dispense Refill  . albuterol (ACCUNEB) 1.25 MG/3ML nebulizer solution Take 1 ampule by nebulization every 6 (six) hours as needed for wheezing.      . Marland Kitchenlbuterol (PROVENTIL HFA;VENTOLIN HFA) 108 (90 BASE) MCG/ACT inhaler Inhale 1 puff into the lungs every 4 (four) hours as needed for wheezing or shortness of breath.  1 Inhaler  11  . Ascorbic Acid Buffered (BUFFERED C POWDER PO) Take 1 packet by mouth as needed (headache).      . Marland Kitchenspirin 81 MG chewable tablet Chew 1 tablet (81 mg total) by mouth daily.  30 tablet  11  . furosemide (LASIX) 20 MG tablet Take 2 tablets (40 mg total) by mouth every morning.  90 tablet  11  . ipratropium (ATROVENT) 0.02 % nebulizer solution Take 2.5 mLs (0.5 mg total) by nebulization every 4 (four) hours as needed for wheezing.  75 mL  2  . lovastatin (MEVACOR) 20 MG tablet Take 1 tablet (20 mg total) by mouth at bedtime.  30 tablet  11  . Fluticasone-Salmeterol (ADVAIR DISKUS) 250-50 MCG/DOSE AEPB Inhale 1 puff into the lungs 2 (two) times daily.  60 each  6  . tiotropium (SPIRIVA HANDIHALER) 18 MCG inhalation capsule Place 1  capsule (18 mcg total) into inhaler and inhale daily.  30 capsule  12   No current facility-administered medications for this visit.   Family History  Problem Relation Age of Onset  . Heart disease Mother 50    Deceased from MI at 47yo  . Hypertension Mother   . Heart disease Father 75    Deceased of MI age 3yo  . Hypertension Father   . Hypertension Brother   . Lung cancer      Grandmother   History   Social History  . Marital Status: Single    Spouse Name: N/A    Number of Children: N/A  . Years of Education: N/A   Social History Main Topics  . Smoking status: Current Every Day Smoker -- 0.50 packs/day for 40 years    Types:  Cigarettes  . Smokeless tobacco: Never Used  . Alcohol Use: No  . Drug Use: No  . Sexual Activity: None   Other Topics Concern  . None   Social History Narrative   Formerly worked as a Scientist, water quality, now disabled.   Divorced.   2 grown children.   Lives with her grandson.   Review of Systems: Constitutional: Denies fever, chills, diaphoresis, appetite change and fatigue.  Cardiovascular: No chest pain, palpitations. Improved/stable leg swelling.  Gastrointestinal:reports abdominal pain - due to coughing. Denies Nausea, vomiting, bloody stools Genitourinary: No dysuria, frequency, hematuria, or flank pain.  Psych: No depression symptoms. No SI or SA.   Objective:  Physical Exam: Filed Vitals:   06/12/13 1618  BP: 116/71  Pulse: 68  Temp: 99 F (37.2 C)  TempSrc: Oral  Height: _0  (1.651 m)  Weight: 243 lb 9.6 oz (110.496 kg)  SpO2: 91%   General: Obese.  MMM. On 4/L and and not in acute distress, she looks comfortable  Lungs: normal work of breathing. No wheezing.  Heart: RRR; no extra sounds or murmurs  Abdomen: Non-distended, normal BS, soft, nontender; no hepatosplenomegaly  Extremities:bilateral pedal edema to level of knees. No joint swelling or tenderness. Neurologic: Normal EOM,  Alert and oriented x3. No obvious neurologic/cranial nerve deficits.  Assessment & Plan:  I have discussed my assessment and plan  with  my attending in the clinic, Dr. Ellwood Dense  as detailed under problem based charting.

## 2013-06-13 ENCOUNTER — Telehealth: Payer: Self-pay | Admitting: *Deleted

## 2013-06-13 MED ORDER — ALBUTEROL SULFATE HFA 108 (90 BASE) MCG/ACT IN AERS: 1.0000 | INHALATION_SPRAY | RESPIRATORY_TRACT | Status: DC | PRN

## 2013-06-13 NOTE — Addendum Note (Signed)
Addended by: Jessee Avers on: 06/13/2013 07:37 PM   Modules accepted: Orders, Medications

## 2013-06-13 NOTE — Telephone Encounter (Signed)
Call from pt - First, she picked up Spiriva from the pharmacy - stated to check w/her doctor about taking Advair and Spiriva together which they do the same thing. Also was told yesterday you were going to refill Dulera and Albuterol.  Lovastatin and Lasix rxs called to [facility] on [address].

## 2013-06-14 NOTE — Telephone Encounter (Signed)
Talked to the patient last evening and clarified on her prescripts. All her meds are now sorted out.

## 2013-06-17 NOTE — Progress Notes (Signed)
Case discussed with Dr.Kazibwe at the time of the visit.  We reviewed the resident's history and exam and pertinent patient test results.  I agree with the assessment, diagnosis, and plan of care documented in the resident's note.

## 2013-06-24 ENCOUNTER — Other Ambulatory Visit: Payer: Self-pay | Admitting: Internal Medicine

## 2013-06-24 DIAGNOSIS — J9610 Chronic respiratory failure, unspecified whether with hypoxia or hypercapnia: Secondary | ICD-10-CM

## 2013-06-30 ENCOUNTER — Emergency Department (HOSPITAL_COMMUNITY): Payer: Medicare HMO

## 2013-06-30 ENCOUNTER — Inpatient Hospital Stay (HOSPITAL_COMMUNITY)
Admission: EM | Admit: 2013-06-30 | Discharge: 2013-07-03 | DRG: 291 | Disposition: A | Discharge status: Home / Self Care | Payer: Medicare HMO | Attending: Internal Medicine | Admitting: Internal Medicine

## 2013-06-30 ENCOUNTER — Encounter (HOSPITAL_COMMUNITY): Payer: Self-pay | Admitting: Emergency Medicine

## 2013-06-30 DIAGNOSIS — Z8249 Family history of ischemic heart disease and other diseases of the circulatory system: Secondary | ICD-10-CM

## 2013-06-30 DIAGNOSIS — Z91199 Patient's noncompliance with other medical treatment and regimen due to unspecified reason: Secondary | ICD-10-CM

## 2013-06-30 DIAGNOSIS — J189 Pneumonia, unspecified organism: Secondary | ICD-10-CM

## 2013-06-30 DIAGNOSIS — M25569 Pain in unspecified knee: Secondary | ICD-10-CM

## 2013-06-30 DIAGNOSIS — B957 Other staphylococcus as the cause of diseases classified elsewhere: Secondary | ICD-10-CM | POA: Diagnosis not present

## 2013-06-30 DIAGNOSIS — R7881 Bacteremia: Secondary | ICD-10-CM

## 2013-06-30 DIAGNOSIS — E874 Mixed disorder of acid-base balance: Secondary | ICD-10-CM | POA: Diagnosis present

## 2013-06-30 DIAGNOSIS — E662 Morbid (severe) obesity with alveolar hypoventilation: Secondary | ICD-10-CM | POA: Diagnosis present

## 2013-06-30 DIAGNOSIS — Z9981 Dependence on supplemental oxygen: Secondary | ICD-10-CM

## 2013-06-30 DIAGNOSIS — Z801 Family history of malignant neoplasm of trachea, bronchus and lung: Secondary | ICD-10-CM

## 2013-06-30 DIAGNOSIS — I2582 Chronic total occlusion of coronary artery: Secondary | ICD-10-CM | POA: Diagnosis present

## 2013-06-30 DIAGNOSIS — I2789 Other specified pulmonary heart diseases: Secondary | ICD-10-CM | POA: Diagnosis present

## 2013-06-30 DIAGNOSIS — Z6841 Body Mass Index (BMI) 40.0 and over, adult: Secondary | ICD-10-CM

## 2013-06-30 DIAGNOSIS — J441 Chronic obstructive pulmonary disease with (acute) exacerbation: Secondary | ICD-10-CM

## 2013-06-30 DIAGNOSIS — I509 Heart failure, unspecified: Secondary | ICD-10-CM

## 2013-06-30 DIAGNOSIS — R7309 Other abnormal glucose: Secondary | ICD-10-CM | POA: Diagnosis present

## 2013-06-30 DIAGNOSIS — J449 Chronic obstructive pulmonary disease, unspecified: Secondary | ICD-10-CM | POA: Diagnosis present

## 2013-06-30 DIAGNOSIS — Z794 Long term (current) use of insulin: Secondary | ICD-10-CM | POA: Diagnosis present

## 2013-06-30 DIAGNOSIS — Z720 Tobacco use: Secondary | ICD-10-CM | POA: Diagnosis present

## 2013-06-30 DIAGNOSIS — Z7982 Long term (current) use of aspirin: Secondary | ICD-10-CM

## 2013-06-30 DIAGNOSIS — Z9861 Coronary angioplasty status: Secondary | ICD-10-CM

## 2013-06-30 DIAGNOSIS — I272 Pulmonary hypertension, unspecified: Secondary | ICD-10-CM

## 2013-06-30 DIAGNOSIS — G8929 Other chronic pain: Secondary | ICD-10-CM | POA: Diagnosis present

## 2013-06-30 DIAGNOSIS — I2510 Atherosclerotic heart disease of native coronary artery without angina pectoris: Secondary | ICD-10-CM | POA: Diagnosis present

## 2013-06-30 DIAGNOSIS — F17200 Nicotine dependence, unspecified, uncomplicated: Secondary | ICD-10-CM

## 2013-06-30 DIAGNOSIS — M25572 Pain in left ankle and joints of left foot: Secondary | ICD-10-CM | POA: Diagnosis present

## 2013-06-30 DIAGNOSIS — M545 Low back pain, unspecified: Secondary | ICD-10-CM | POA: Diagnosis present

## 2013-06-30 DIAGNOSIS — Z888 Allergy status to other drugs, medicaments and biological substances status: Secondary | ICD-10-CM

## 2013-06-30 DIAGNOSIS — J4489 Other specified chronic obstructive pulmonary disease: Secondary | ICD-10-CM | POA: Diagnosis present

## 2013-06-30 DIAGNOSIS — E119 Type 2 diabetes mellitus without complications: Secondary | ICD-10-CM | POA: Diagnosis present

## 2013-06-30 DIAGNOSIS — I10 Essential (primary) hypertension: Secondary | ICD-10-CM | POA: Diagnosis present

## 2013-06-30 DIAGNOSIS — J9622 Acute and chronic respiratory failure with hypercapnia: Secondary | ICD-10-CM

## 2013-06-30 DIAGNOSIS — I5033 Acute on chronic diastolic (congestive) heart failure: Principal | ICD-10-CM

## 2013-06-30 DIAGNOSIS — Z6839 Body mass index (BMI) 39.0-39.9, adult: Secondary | ICD-10-CM

## 2013-06-30 DIAGNOSIS — J9620 Acute and chronic respiratory failure, unspecified whether with hypoxia or hypercapnia: Secondary | ICD-10-CM

## 2013-06-30 DIAGNOSIS — M25562 Pain in left knee: Secondary | ICD-10-CM | POA: Diagnosis present

## 2013-06-30 DIAGNOSIS — Z79899 Other long term (current) drug therapy: Secondary | ICD-10-CM

## 2013-06-30 DIAGNOSIS — J9819 Other pulmonary collapse: Secondary | ICD-10-CM | POA: Diagnosis present

## 2013-06-30 DIAGNOSIS — G4733 Obstructive sleep apnea (adult) (pediatric): Secondary | ICD-10-CM

## 2013-06-30 DIAGNOSIS — E785 Hyperlipidemia, unspecified: Secondary | ICD-10-CM | POA: Diagnosis present

## 2013-06-30 DIAGNOSIS — W19XXXA Unspecified fall, initial encounter: Secondary | ICD-10-CM

## 2013-06-30 DIAGNOSIS — Z9119 Patient's noncompliance with other medical treatment and regimen: Secondary | ICD-10-CM

## 2013-06-30 DIAGNOSIS — E876 Hypokalemia: Secondary | ICD-10-CM | POA: Diagnosis not present

## 2013-06-30 LAB — CBC WITH DIFFERENTIAL/PLATELET
Basophils Absolute: 0 10*3/uL (ref 0.0–0.1)
Basophils Relative: 0 % (ref 0–1)
EOS PCT: 1 % (ref 0–5)
Eosinophils Absolute: 0 10*3/uL (ref 0.0–0.7)
HCT: 43.9 % (ref 36.0–46.0)
Hemoglobin: 13.4 g/dL (ref 12.0–15.0)
LYMPHS ABS: 0.8 10*3/uL (ref 0.7–4.0)
LYMPHS PCT: 13 % (ref 12–46)
MCH: 30 pg (ref 26.0–34.0)
MCHC: 30.5 g/dL (ref 30.0–36.0)
MCV: 98.2 fL (ref 78.0–100.0)
MONO ABS: 0.5 10*3/uL (ref 0.1–1.0)
Monocytes Relative: 8 % (ref 3–12)
Neutro Abs: 4.6 10*3/uL (ref 1.7–7.7)
Neutrophils Relative %: 78 % — ABNORMAL HIGH (ref 43–77)
PLATELETS: 150 10*3/uL (ref 150–400)
RBC: 4.47 MIL/uL (ref 3.87–5.11)
RDW: 13.3 % (ref 11.5–15.5)
WBC: 5.9 10*3/uL (ref 4.0–10.5)

## 2013-06-30 LAB — BLOOD GAS, ARTERIAL
ACID-BASE EXCESS: 13 mmol/L — AB (ref 0.0–2.0)
BICARBONATE: 42.8 meq/L — AB (ref 20.0–24.0)
Drawn by: 232811
O2 Content: 4 L/min
O2 SAT: 88.3 %
PCO2 ART: 85.4 mmHg — AB (ref 35.0–45.0)
PO2 ART: 54.9 mmHg — AB (ref 80.0–100.0)
Patient temperature: 98.7
TCO2: 38.7 mmol/L (ref 0–100)
pH, Arterial: 7.321 — ABNORMAL LOW (ref 7.350–7.450)

## 2013-06-30 LAB — BASIC METABOLIC PANEL
BUN: 8 mg/dL (ref 6–23)
CO2: 41 meq/L — AB (ref 19–32)
CREATININE: 0.57 mg/dL (ref 0.50–1.10)
Calcium: 9.5 mg/dL (ref 8.4–10.5)
Chloride: 92 mEq/L — ABNORMAL LOW (ref 96–112)
GFR calc Af Amer: >90 mL/min (ref >90)
GLUCOSE: 120 mg/dL — AB (ref 70–99)
Potassium: 4.7 mEq/L (ref 3.7–5.3)
SODIUM: 139 meq/L (ref 137–147)

## 2013-06-30 LAB — TROPONIN I: Troponin I: <0.3 ng/mL (ref <0.30)

## 2013-06-30 MED ORDER — CEFTRIAXONE SODIUM 1 G IJ SOLR: 1.0000 g | Freq: Once | INTRAMUSCULAR | Status: DC

## 2013-06-30 MED ORDER — DEXTROSE 5 % IV SOLN: 500.0000 mg | Freq: Once | INTRAVENOUS | Status: DC

## 2013-06-30 NOTE — ED Notes (Signed)
Bed: FX25 Expected date:  Expected time:  Means of arrival:  Comments: EMS 14F fall

## 2013-06-30 NOTE — ED Provider Notes (Signed)
CSN: [id number]     Arrival date & time 06/30/13  1926 History   First MD Initiated Contact with Patient 06/30/13 1932     Chief Complaint  Patient presents with  . Knee Pain    HPI  [name] is a 58 y.o. female with a PMH of COPD on 4L home O2, CAD s/p DES, pulmonary HTN, CHF, diastolic dysfunction, sleep apnea, pre-diabetes, tobacco abuse, hyperlipidemia, and GERD who presents to the ED for evaluation of knee pain. History was provided by the patient. Patient alert and oriented x 3. Patient lethargic falling asleep during history. Patient is a poor historian and is changing her story and complaints. Patient states that PTA she was walking in the hallway and twisted and her knees gave out. She fell and hit her head on the stairs. Had a brief moment of LOC. Patient complains of knee pain bilaterally. She also has a mild headache. No neck pain. Has chronic lower back pain with no acute changes. Upon ROS patient does admit to chest pain which started upon arrival in the ED but has since resolved. Chest pain located on the right diffusely without radiation and is described as an aching pain. Patient states her breathing has been more difficulty during transfer to hospital. Patient arrived with hypoxia at 82%. She denies any changes in her SOB which is her baseline. Has chronic cough. No fevers, abdominal pain, emesis, vision changes, weakness, loss of sensation, numbness/tingling. Currently still smoking.    Past Medical History  Diagnosis Date  . Coronary artery disease     S/P PCI of LAD with DES (12/2008). Total occlusion of RCA noted at that time., medically managed. ACS ruled out 03/2009 with Lexiscan myoview . Followed by [facility].  . Pulmonary hypertension     2-D Echo (29/1916) - Systolic pressure was moderately increased. PA peak pressure  57mHg. secondary pulm htn likely on basis of comb of interstital lung disease, severe copd, small airways disease, severe sleep apnea and cor  pulmonale,. Followed by Dr. WJoya Gaskins(Velora Heckler  . Diastolic dysfunction     2-D Echo (12/2008) - Normal LV Systolic funciton with EF 60-65%. Grade 1 diastolid dysfunction. No regional wall motion abnormalities. Moderate pulmonary HTN with PA peak pressure 537mg.  . Marland KitchenOPD (chronic obstructive pulmonary disease)     Severe. Gold Stage IV.  PFTs (12/2008) - severe obstructive airway disease. Active tobacco use. Requires 4L O2 at home.  . Pulmonary nodule, right     Small right middle lobe nodule. Stable as of 12/2008.  . Marland Kitchenleep apnea     Presumptive. No documented sleep studies.   . Prediabetes 12/2008    HgbA1c 6.4 (12/2008)  . Hx MRSA infection     Recurrent MRSA thigh abscesses.  . Tobacco abuse     Ongoing.  . Obesity   . Hyperlipidemia   . GERD (gastroesophageal reflux disease)     S/P Nissen fundoplication.  . Shortness of breath   . CHF (congestive heart failure)    Past Surgical History  Procedure Laterality Date  . Total abdominal hysterectomy w/ bilateral salpingoophorectomy    . Nissen fundoplication     Family History  Problem Relation Age of Onset  . Heart disease Mother [id number]  Deceased from MI at 473yo. Hypertension Mother   . Heart disease Father [id number]  Deceased of MI age 59yo. Hypertension Father   . Hypertension Brother   . Lung cancer  Grandmother   History  Substance Use Topics  . Smoking status: Current Every Day Smoker -- 0.50 packs/day for 40 years    Types: Cigarettes  . Smokeless tobacco: Never Used  . Alcohol Use: No   OB History   Grav Para Term Preterm Abortions TAB SAB Ect Mult Living                 Review of Systems  Constitutional: Negative for fever.  Eyes: Negative for photophobia and visual disturbance.  Respiratory: Positive for cough and shortness of breath (chronic). Negative for wheezing.   Cardiovascular: Positive for chest pain and leg swelling.  Gastrointestinal: Negative for nausea, vomiting and abdominal pain.   Musculoskeletal: Positive for arthralgias (knees) and back pain (chronic). Negative for myalgias and neck pain.  Skin: Negative for wound.  Neurological: Positive for headaches. Negative for weakness.    Allergies  Fluconazole  Home Medications   Current Outpatient Rx  Name  Route  Sig  Dispense  Refill  . albuterol (ACCUNEB) 1.25 MG/3ML nebulizer solution   Nebulization   Take 1 ampule by nebulization every 6 (six) hours as needed for wheezing.         Marland Kitchen albuterol (PROVENTIL HFA;VENTOLIN HFA) 108 (90 BASE) MCG/ACT inhaler   Inhalation   Inhale 1 puff into the lungs every 4 (four) hours as needed for wheezing or shortness of breath.   1 Inhaler   11   . Ascorbic Acid Buffered (BUFFERED C POWDER PO)   Oral   Take 1 packet by mouth as needed (headache).         Marland Kitchen aspirin 81 MG chewable tablet   Oral   Chew 1 tablet (81 mg total) by mouth daily.   30 tablet   11   . furosemide (LASIX) 20 MG tablet   Oral   Take 2 tablets (40 mg total) by mouth every morning.   90 tablet   11   . ipratropium (ATROVENT) 0.02 % nebulizer solution   Nebulization   Take 2.5 mLs (0.5 mg total) by nebulization every 4 (four) hours as needed for wheezing.   75 mL   2   . lovastatin (MEVACOR) 20 MG tablet   Oral   Take 1 tablet (20 mg total) by mouth at bedtime.   30 tablet   11   . tiotropium (SPIRIVA HANDIHALER) 18 MCG inhalation capsule   Inhalation   Place 1 capsule (18 mcg total) into inhaler and inhale daily.   30 capsule   12    BP 146/95  Pulse 81  Temp(Src) 98.4 F (36.9 C) (Oral)  Resp 26  SpO2 81%   Physical Exam  Nursing note and vitals reviewed. Constitutional: She is oriented to person, place, and time. She appears well-developed and well-nourished. No distress.  Lethargic  HENT:  Head: Normocephalic and atraumatic.  Right Ear: External ear normal.  Left Ear: External ear normal.  Nose: Nose normal.  Mouth/Throat: Oropharynx is clear and moist.  Eyes:  Conjunctivae and EOM are normal. Pupils are equal, round, and reactive to light. Right eye exhibits no discharge. Left eye exhibits no discharge.  Neck: Normal range of motion. Neck supple.  No cervical spinal or paraspinal tenderness to palpation throughout.  No limitations with neck ROM.    Cardiovascular: Normal rate, regular rhythm, normal heart sounds and intact distal pulses.  Exam reveals no gallop and no friction rub.   No murmur heard. Dorsalis pedis pulses present and equal bilaterally  Pulmonary/Chest: Effort normal and breath sounds normal. No respiratory distress. She has no wheezes. She has no rales. She exhibits no tenderness.  Decreased breath sounds throughout. Patient speaking in full sentences.   Abdominal: Soft. Bowel sounds are normal. She exhibits no distension and no mass. There is no tenderness. There is no rebound and no guarding.  Musculoskeletal: Normal range of motion. She exhibits no edema and no tenderness.  No tenderness to palpation to the thoracic or lumbar spinous processes throughout.  No tenderness to palpation to the paraspinal muscles throughout. No tenderness to palpation to the LE or UE bilaterally. No knee tenderness bilaterally. Strength 5/5 in the upper and lower extremities bilaterally. 1+ pedal edema bilaterally.   Neurological: She is alert and oriented to person, place, and time.  GCS 15.  No focal neurological deficits.  CN 2-12 intact.  No pronator drift.   Skin: Skin is warm and dry. She is not diaphoretic.  No wounds throughout    ED Course  Procedures (including critical care time) Labs Review Labs Reviewed  CBC WITH DIFFERENTIAL  BASIC METABOLIC PANEL  TROPONIN I  BLOOD GAS, ARTERIAL   Imaging Review No results found.   EKG Interpretation   Date/Time:  Sunday June 30 2013 19:39:22 EDT Ventricular Rate:  82 PR Interval:  140 QRS Duration: 95 QT Interval:  401 QTC Calculation: 468 R Axis:   101 Text Interpretation:  Sinus  rhythm Consider right ventricular hypertrophy  Baseline wander in lead(s) V4 No significant change since last tracing  Confirmed by KNAPP  MD-J, JON ([id number]) on 06/30/2013 8:17:28 PM      CT Head Wo Contrast (Final result)  Result time: 06/30/13 20:44:31    Final result by Rad Results In Interface (06/30/13 20:44:31)    Narrative:   CLINICAL DATA: Dizzy. Near Fall.  EXAM: CT HEAD WITHOUT CONTRAST  TECHNIQUE: Contiguous axial images were obtained from the base of the skull through the vertex without intravenous contrast.  COMPARISON: None.  FINDINGS: Ventricles are normal in size and configuration. There are no parenchymal masses or mass effect. There is no evidence of an infarct. Minimal white matter hypoattenuation is noted consistent with chronic microvascular ischemic change.  No extra-axial masses or abnormal fluid collections.  There is no intracranial hemorrhage.  Visualized sinuses and mastoid air cells are clear.  IMPRESSION: 1. No acute intracranial abnormalities. 2. Minimal chronic microvascular ischemic change. 3. No other abnormalities.   Electronically Signed By: Lajean Manes M.D. On: 06/30/2013 20:44             DG Chest 2 View (Final result)  Result time: 06/30/13 20:35:12    Final result by Rad Results In Interface (06/30/13 20:35:12)    Narrative:   CLINICAL DATA: Short of breath.  EXAM: CHEST 2 VIEW  COMPARISON: 05/14/2013  FINDINGS: Cardiac silhouette is mildly enlarged. Normal mediastinal contours. Stable prominent pulmonary arteries.  Lungs are hyperexpanded. There is increased opacity at the right lung base when compared to the prior exam. This silhouettes the portion of the right lateral posterior hemidiaphragm.  There are prominent bronchovascular markings similar to the prior exam as well as mild bilateral interstitial thickening.  No pleural effusion or pneumothorax.  The bony thorax is demineralized but  intact.  IMPRESSION: 1. New right lower lobe opacity. This suggests pneumonia in the proper clinical setting. It could be due to atelectasis. 2. Lung hyperexpansion with prominent bronchovascular and mildly thickened interstitial markings. These findings are stable from the prior study. 3.  Mild stable cardiomegaly.   Electronically Signed By: Lajean Manes M.D. On: 06/30/2013 20:35             DG Knee Complete 4 Views Right (Final result)  Result time: 06/30/13 20:40:18    Final result by Rad Results In Interface (06/30/13 20:40:18)    Narrative:   CLINICAL DATA: Fall, bilateral knee pain  EXAM: RIGHT KNEE - COMPLETE 4+ VIEW  COMPARISON: Concurrently obtained radiographs of the contralateral left knee. Prior right knee MRI 03/04/2006  FINDINGS: There is no evidence of fracture, dislocation, or joint effusion. There is no evidence of arthropathy or other focal bone abnormality. Minimal tricompartmental degenerative change. Incidental note is made of a fabella. Soft tissues are unremarkable.  IMPRESSION: Negative.  Minimal tricompartmental degenerative changes.   Electronically Signed By: Jacqulynn Cadet M.D. On: 06/30/2013 20:40             DG Knee Complete 4 Views Left (Final result)  Result time: 06/30/13 20:39:17    Final result by Rad Results In Interface (06/30/13 20:39:17)    Narrative:   CLINICAL DATA: Fall, bilateral knee pain  EXAM: LEFT KNEE - COMPLETE 4+ VIEW  COMPARISON: Concurrently obtained radiographs of the contralateral right knee  FINDINGS: There is no evidence of fracture, dislocation, or joint effusion. There is no evidence of arthropathy or other focal bone abnormality. Incidental note is made of a fabella. Minimal tricompartmental degenerative changes. Soft tissues are unremarkable. Trace atherosclerotic calcification along the course of the superficial femoral artery.  IMPRESSION: 1. No acute fracture, malalignment or  joint effusion. 2. Minimal tricompartmental degenerative changes. 3. Trace atherosclerotic calcification along the course of the superficial femoral artery.   Electronically Signed By: Jacqulynn Cadet M.D. On: 06/30/2013 20:39    CBC WITH DIFFERENTIAL      Result Value Ref Range   WBC 5.9  4.0 - 10.5 K/uL   RBC 4.47  3.87 - 5.11 MIL/uL   Hemoglobin 13.4  12.0 - 15.0 g/dL   HCT 43.9  36.0 - 46.0 %   MCV 98.2  78.0 - 100.0 fL   MCH 30.0  26.0 - 34.0 pg   MCHC 30.5  30.0 - 36.0 g/dL   RDW 13.3  11.5 - 15.5 %   Platelets 150  150 - 400 K/uL   Neutrophils Relative % 78 (*) 43 - 77 %   Neutro Abs 4.6  1.7 - 7.7 K/uL   Lymphocytes Relative 13  12 - 46 %   Lymphs Abs 0.8  0.7 - 4.0 K/uL   Monocytes Relative 8  3 - 12 %   Monocytes Absolute 0.5  0.1 - 1.0 K/uL   Eosinophils Relative 1  0 - 5 %   Eosinophils Absolute 0.0  0.0 - 0.7 K/uL   Basophils Relative 0  0 - 1 %   Basophils Absolute 0.0  0.0 - 0.1 K/uL  BASIC METABOLIC PANEL      Result Value Ref Range   Sodium 139  137 - 147 mEq/L   Potassium 4.7  3.7 - 5.3 mEq/L   Chloride 92 (*) 96 - 112 mEq/L   CO2 41 (*) 19 - 32 mEq/L   Glucose, Bld 120 (*) 70 - 99 mg/dL   BUN 8  6 - 23 mg/dL   Creatinine, Ser 0.57  0.50 - 1.10 mg/dL   Calcium 9.5  8.4 - 10.5 mg/dL   GFR calc non Af Amer >90  >90 mL/min   GFR calc Af Amer >90  >  90 mL/min  TROPONIN I      Result Value Ref Range   Troponin I <0.30  <0.30 ng/mL  BLOOD GAS, ARTERIAL      Result Value Ref Range   O2 Content 4.0     Delivery systems NASAL CANNULA     pH, Arterial 7.321 (*) 7.350 - 7.450   pCO2 arterial 85.4 (*) 35.0 - 45.0 mmHg   pO2, Arterial 54.9 (*) 80.0 - 100.0 mmHg   Bicarbonate 42.8 (*) 20.0 - 24.0 mEq/L   TCO2 38.7  0 - 100 mmol/L   Acid-Base Excess 13.0 (*) 0.0 - 2.0 mmol/L   O2 Saturation 88.3     Patient temperature 98.7     Collection site RIGHT RADIAL     Drawn by [id number]     Sample type ARTERIAL     Allens test (pass/fail) PASS  PASS    MDM    Mailin Coglianese is a 58 y.o. female with a PMH of COPD on 4L home O2, CAD s/p DES, pulmonary HTN, CHF, diastolic dysfunction, sleep apnea, pre-diabetes, tobacco abuse, hyperlipidemia, and GERD who presents to the ED for evaluation of knee pain.  Rechecks  9:00 PM = Patient placed on BiPAP.   Consults  10:00 PM = Spoke with Dr. Nicoletta Dress. [facility] will be transferred to [facility] and admitted to step-down   Patient evaluated for a mechanical fall. Patient sustained a head injury with LOC. Head CT negative. Patient also complained of knee pain. X-rays showed degeneratives changes with no acute fx or malalignment. Patient hypoxic upon arrival. Found to have acute on chronic respiratory failure with hypercapnia. Patient placed on BiPAP due to elevated CO2 (85.4). Patient has hypoxia in the high 80 to low 90's on 4 L which appears to be her baseline. Patient found to have developing pneumonia. Blood cultures drawn. Patient started on Rocephin and azithromycin. Patient also complained of chest pain on ROS which resolved. EKG negative for any acute ischemic changes. Troponin negative. Possibly due to pneumonia vs other respiratory etiology. Patient stable. Transferred to [facility] for [facility].    Final impressions: 1. Acute on chronic respiratory failure with hypercapnia   2. Fall   3. Knee pain   4. Community acquired pneumonia     Mercy Moore PA-C   This patient was discussed with Dr. Shirlee Latch, PA-C 07/02/13 [phone]

## 2013-06-30 NOTE — Progress Notes (Signed)
Per RN, pt removed bipap mask and is refusing to put it back on.  Pt seen, HR80, rr21, spo2 91% on 4lnc.  Spoke with pt about benefits of using bipap, but she still refuses to wear it at this time.  Bipap remains in room on standby.

## 2013-06-30 NOTE — ED Provider Notes (Signed)
Pt presents with a recent fall.  She denies any acute problems with her breathing however she has chronic copd , is on o2 and was noted to be hypoxic here.  On exam she is alert, no resp distress but does appear somnolent and easily nods off.  AbG is consistent with an acute on chronic resp acidosis.  Will start bipap.  Start abx to treat CAP.  Las hospitalization was greater than 90 days ago.  Medical screening examination/treatment/procedure(s) were conducted as a shared visit with non-physician practitioner(s) and myself.  I personally evaluated the patient during the encounter.   EKG Interpretation   Date/Time:  Sunday June 30 2013 19:39:22 EDT Ventricular Rate:  82 PR Interval:  140 QRS Duration: 95 QT Interval:  401 QTC Calculation: 468 R Axis:   101 Text Interpretation:  Sinus rhythm Consider right ventricular hypertrophy  Baseline wander in lead(s) V4 No significant change since last tracing  Confirmed by Loden Laurent  MD-J, Carlon Chaloux ([id number]) on 06/30/2013 8:17:28 PM        Kathalene Frames, MD 06/30/13 [id number]

## 2013-06-30 NOTE — H&P (Signed)
Date: 06/30/2013               Patient Name:  [name] MRN: [id number]  DOB: 1956-01-22 Age / Sex: 58 y.o., female   PCP: Jessee Avers, MD         Medical Service: Internal Medicine Teaching Service         Attending Physician: Dr. Dominic Pea, DO    First Contact: Dr. Lucila Maine Pager: [phone]  Second Contact: Dr. Hayes Ludwig Pager: [phone]       After Hours (After 5p/  First Contact Pager: [phone]  weekends / holidays): Second Contact Pager: [phone]   Chief Complaint: Fall at home, hypoxia  History of Present Illness: [name] is a 58 y.o. female w/ PMHx of HTN, HLD, COPD (on 4L home O2), CAD (DES to LAD, 2010), dCHF, PAH, OSA, and GERD s/p Nissen Fundoplication, presents to the [facility] ED w/ complaints of recent fall at home. The patient claims that she has not been feeling well for the past 2 days and has been in bed, saying that she has been very tired. She got up today to go the the bathroom and said that she felt unsteady on her feet for a short time and fell to the ground, backwards, hit her head and hurt her knees bilaterally. She went to the [facility] ED for her knee pain and headache. She denies LOC, change in vision, dizziness, lightheadedness, palpitations, or chest pain.  On arrival to the ED, patient was noted to have SpO2 of 82%, but she denied any significant change from her baseline SOB as she has severe COPD and uses 4L O2. The patient was placed on BiPAP w/ significant improvement in SpO2 and decrease in SOB.  She denies any recent sick contacts, acute cough, nausea, vomiting, abdominal pain, diarrhea, LE swelling, or significant weight change.  Meds: Current Facility-Administered Medications  Medication Dose Route Frequency Provider Last Rate Last Dose  . azithromycin (ZITHROMAX) 500 mg in dextrose 5 % 250 mL IVPB  500 mg Intravenous Once Lucila Maine, PA-C      . cefTRIAXone (ROCEPHIN) 1 g in dextrose 5 % 50 mL IVPB  1 g Intravenous Once Lucila Maine, PA-C       Current Outpatient Prescriptions  Medication Sig Dispense Refill  . albuterol (ACCUNEB) 1.25 MG/3ML nebulizer solution Take 1 ampule by nebulization every 6 (six) hours as needed for wheezing.      Marland Kitchen albuterol (PROVENTIL HFA;VENTOLIN HFA) 108 (90 BASE) MCG/ACT inhaler Inhale 1 puff into the lungs every 4 (four) hours as needed for wheezing or shortness of breath.  1 Inhaler  11  . aspirin 81 MG chewable tablet Chew 1 tablet (81 mg total) by mouth daily.  30 tablet  11  . furosemide (LASIX) 20 MG tablet Take 40 mg by mouth 2 (two) times daily.      Marland Kitchen ipratropium (ATROVENT) 0.02 % nebulizer solution Take 2.5 mLs (0.5 mg total) by nebulization every 4 (four) hours as needed for wheezing.  75 mL  2  . lovastatin (MEVACOR) 20 MG tablet Take 1 tablet (20 mg total) by mouth at bedtime.  30 tablet  11  . tiotropium (SPIRIVA HANDIHALER) 18 MCG inhalation capsule Place 1 capsule (18 mcg total) into inhaler and inhale daily.  30 capsule  12    Allergies: Allergies as of 06/30/2013 - Review Complete 06/30/2013  Allergen Reaction Noted  . Fluconazole Anaphylaxis    Past Medical History  Diagnosis Date  . Coronary artery disease     S/P PCI of LAD with DES (12/2008). Total occlusion of RCA noted at that time., medically managed. ACS ruled out 03/2009 with Lexiscan myoview . Followed by [place].  . Pulmonary hypertension     2-D Echo (67/6720) - Systolic pressure was moderately increased. PA peak pressure  49mHg. 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 Followed by Dr. WJoya Gaskins(Velora Heckler  . Diastolic dysfunction     2-D Echo (12/2008) - Normal LV Systolic funciton with EF 60-65%. Grade 1 diastolid dysfunction. No regional wall motion abnormalities. Moderate pulmonary HTN with PA peak pressure 595mg.  . Marland KitchenOPD (chronic obstructive pulmonary disease)     Severe. Gold Stage IV.  PFTs  (12/2008) - severe obstructive airway disease. Active tobacco use. Requires 4L O2 at home.  . Pulmonary nodule, right     Small right middle lobe nodule. Stable as of 12/2008.  . Marland Kitchenleep apnea     Presumptive. No documented sleep studies.   . Prediabetes 12/2008    HgbA1c 6.4 (12/2008)  . Hx MRSA infection     Recurrent MRSA thigh abscesses.  . Tobacco abuse     Ongoing.  . Obesity   . Hyperlipidemia   . GERD (gastroesophageal reflux disease)     S/P Nissen fundoplication.  . Shortness of breath   . CHF (congestive heart failure)    Past Surgical History  Procedure Laterality Date  . Total abdominal hysterectomy w/ bilateral salpingoophorectomy    . Nissen fundoplication     Family History  Problem Relation Age of Onset  . Heart disease Mother 4722  Deceased from MI at 59yo. Hypertension Mother   . Heart disease Father 5433  Deceased of MI age 59yo. Hypertension Father   . Hypertension Brother   . Lung cancer      Grandmother   History   Social History  . Marital Status: Single    Spouse Name: N/A    Number of Children: N/A  . Years of Education: N/A   Occupational History  . Not on file.   Social History Main Topics  . Smoking status: Current Every Day Smoker -- 0.50 packs/day for 40 years    Types: Cigarettes  . Smokeless tobacco: Never Used  . Alcohol Use: No  . Drug Use: No  . Sexual Activity: Not on file   Other Topics Concern  . Not on file   Social History Narrative   Formerly worked as a caScientist, water qualitynow disabled.   Divorced.   2 grown children.   Lives with her grandson.    Review of Systems: General: Positive for fatigue. Denies fever, chills, diaphoresis, appetite change.  Respiratory: Positive for SOB. Denies cough, chest tightness, and wheezing.   Cardiovascular: Denies chest pain and palpitations.  Gastrointestinal: Denies nausea, vomiting, abdominal pain, diarrhea, constipation, blood in stool and abdominal distention.  Genitourinary:  Denies dysuria, urgency, frequency, hematuria, and flank pain. Endocrine: Denies hot or cold intolerance, polyuria, and polydipsia. Musculoskeletal: Positive for b/l knee pain and left ankle pain. Denies myalgias, back pain, joint swelling, and gait problem.  Skin: Denies pallor, rash and wounds.  Neurological: Positive for generalized weakness. Denies dizziness, seizures, syncope, lightheadedness, numbness and headaches.  Psychiatric/Behavioral: Denies mood changes, confusion, nervousness, sleep disturbance and agitation.  Physical Exam: Filed Vitals:   06/30/13 1926 06/30/13  1940 06/30/13 2117  BP:  146/95 117/83  Pulse: 80 81 86  Temp:  98.4 F (36.9 C)   TempSrc:  Oral   Resp: _0 Height:   _1  (1.651 m)  SpO2: 82% 81% 93%  General: Vital signs reviewed.  Patient is an obese female, in no acute distress and cooperative with exam. Lethargic on exam.  Head: Normocephalic and atraumatic. Eyes: PERRL, EOMI, conjunctivae normal, No scleral icterus. Xanthelasma. Neck: Supple, trachea midline, normal ROM, No JVD, masses, thyromegaly, or carotid bruit present.  Cardiovascular: RRR, S1 normal, S2 normal, no murmurs, gallops, or rubs. Pulmonary/Chest: Air entry equal bilaterally, no wheezes. Mild bibasilar crackles.  Abdominal: Soft, non-tender, non-distended, BS +, no masses, organomegaly, or guarding present.  Musculoskeletal: No joint deformities, erythema, or stiffness, ROM full and nontender. Extremities: No swelling or edema,  pulses symmetric and intact bilaterally. No cyanosis or clubbing. Left ankle tenderness to palpation.  Neurological: A&O x3, Strength is normal and symmetric bilaterally, cranial nerve II-XII are grossly intact, no focal motor deficit, sensory intact to light touch bilaterally.  Skin: Warm, dry and intact. No rashes or erythema.  Psychiatric: Normal mood and affect. speech and behavior is normal. Cognition and memory are normal.   Lab results: Basic  Metabolic Panel:  Recent Labs  06/30/13 2008  NA 139  K 4.7  CL 92*  CO2 41*  GLUCOSE 120*  BUN 8  CREATININE 0.57  CALCIUM 9.5   CBC:  Recent Labs  06/30/13 2008  WBC 5.9  NEUTROABS 4.6  HGB 13.4  HCT 43.9  MCV 98.2  PLT 150   Cardiac Enzymes:  Recent Labs  06/30/13 2008  TROPONINI <0.30   Imaging results:  Dg Chest 2 View  06/30/2013   CLINICAL DATA:  Short of breath.  EXAM: CHEST  2 VIEW  COMPARISON:  05/14/2013  FINDINGS: Cardiac silhouette is mildly enlarged. Normal mediastinal contours. Stable prominent pulmonary arteries.  Lungs are hyperexpanded. There is increased opacity at the right lung base when compared to the prior exam. This silhouettes the portion of the right lateral posterior hemidiaphragm.  There are prominent bronchovascular markings similar to the prior exam as well as mild bilateral interstitial thickening.  No pleural effusion or pneumothorax.  The bony thorax is demineralized but intact.  IMPRESSION: 1. New right lower lobe opacity. This suggests pneumonia in the proper clinical setting. It could be due to atelectasis. 2. Lung hyperexpansion with prominent bronchovascular and mildly thickened interstitial markings. These findings are stable from the prior study. 3. Mild stable cardiomegaly.   Electronically Signed   By: Lajean Manes M.D.   On: 06/30/2013 20:35   Ct Head Wo Contrast  06/30/2013   CLINICAL DATA:  Dizzy.  Near Fall.  EXAM: CT HEAD WITHOUT CONTRAST  TECHNIQUE: Contiguous axial images were obtained from the base of the skull through the vertex without intravenous contrast.  COMPARISON:  None.  FINDINGS: Ventricles are normal in size and configuration. There are no parenchymal masses or mass effect. There is no evidence of an infarct. Minimal white matter hypoattenuation is noted consistent with chronic microvascular ischemic change.  No extra-axial masses or abnormal fluid collections.  There is no intracranial hemorrhage.  Visualized  sinuses and mastoid air cells are clear.  IMPRESSION: 1. No acute intracranial abnormalities. 2. Minimal chronic microvascular ischemic change. 3. No other abnormalities.   Electronically Signed   By: Lajean Manes M.D.   On: 06/30/2013 20:44   Dg Knee Complete  4 Views Left  06/30/2013   CLINICAL DATA:  Fall, bilateral knee pain  EXAM: LEFT KNEE - COMPLETE 4+ VIEW  COMPARISON:  Concurrently obtained radiographs of the contralateral right knee  FINDINGS: There is no evidence of fracture, dislocation, or joint effusion. There is no evidence of arthropathy or other focal bone abnormality. Incidental note is made of a fabella. Minimal tricompartmental degenerative changes. Soft tissues are unremarkable. Trace atherosclerotic calcification along the course of the superficial femoral artery.  IMPRESSION: 1. No acute fracture, malalignment or joint effusion. 2. Minimal tricompartmental degenerative changes. 3. Trace atherosclerotic calcification along the course of the superficial femoral artery.   Electronically Signed   By: Jacqulynn Cadet M.D.   On: 06/30/2013 20:39   Dg Knee Complete 4 Views Right  06/30/2013   CLINICAL DATA:  Fall, bilateral knee pain  EXAM: RIGHT KNEE - COMPLETE 4+ VIEW  COMPARISON:  Concurrently obtained radiographs of the contralateral left knee. Prior right knee MRI 03/04/2006  FINDINGS: There is no evidence of fracture, dislocation, or joint effusion. There is no evidence of arthropathy or other focal bone abnormality. Minimal tricompartmental degenerative change. Incidental note is made of a fabella. Soft tissues are unremarkable.  IMPRESSION: Negative.  Minimal tricompartmental degenerative changes.   Electronically Signed   By: Jacqulynn Cadet M.D.   On: 06/30/2013 20:40   Other results: EKG: NSR @ 82 bpm, right axis deviation  Assessment & Plan by Problem:  Ms. Brownie Gockel is a 58 y.o. female w/ PMHx of HTN, HLD, COPD (on 4L home O2), CAD (DES to LAD, 2010), dCHF, PAH,  OSA, and GERD s/p Nissen Fundoplication, admitted for suspected COPD exacerbation.  Acute on Chronic Respiratory Failure 2/2 Mild Volume Overload- Patient w/ h/o severe COPD, on 4L O2 via [place] at home. Found to be hypoxic in [facility] ED, however, patient has baseline respiratory failure. ABG showed pH 7.32, pCO2 85.4, pO2 55, HCO3 42.8, SpO2 88.3%. Patient takes Lasix 40 mg po qd, but claims that she hasn't taken it in the last 2 days because she hasn't felt well and was supposed to go to her doctor's appointment. Patient w/ mild bibasilar crackles on exam and 3 pillow orthopnea. CXR in ED shows b/l opacities, originally thought to be pneumonia in the [facility] ED, started on Azithromycin + Rocephin for CAP coverage. No leukocytosis (5.9) on admission, no h/o cough, significant change from baseline SOB, or increased O2 requirement at home. Suspect that patient has mild pulmonary congestion 2/2 recent Lasix non-compliance in the setting of previously existing chronic respiratory failure. Troponin x1 negative. -Lasix 40 mg IV once; then restart home Lasix 40 mg po qd -Repeat CXR in AM -Troponin x2 pending -Hold Abx for now; can restart if no improvement in symptoms and CXR findings -Continue Duonebs q6h prn -CBC, BMP, magnesium in AM -O2 via [place] -Continue home Spiriva -Incentive spirometry for atelectasis  dCHF- Previous ECHO performed on 11/07/12 showed EF of 62-03%, normal systolic function. Paradoxical septal motion, pulmonary artery pressure of 64 mm Hg (see below). On Lasix as above, however, noncompliant for 2 days, likely contributing to current respiratory status. -Lasix 40 mg IV as above, then restart home dose  Fall at Home- Patient's chief complaint on presentation. She claims she fell at home from standing, fell backwards and hit her head and hurt her knees and left ankle. Bilateral knee XR show mild degenerative changes, otherwise completely normal. CT head w/out acute findings. Still w/ left ankle  pain. -Left ankle  XR -Ibuprofen prn pain -Ice pack for left ankle  HTN- Normotensive on admission. Patient takes Lasix 40 mg qd at home. -Lasix IV as above  CAD- Previous cardiac catheterization on 11/05/12 showed moderate PAH, likely d/t COPD + OSA. In 12/2008, patient had DES to proximal LAD w/ total RCA occlusion. On ASA 81 + Lovastatin 20 mg qhs.  -Continue ASA + Zocor  HLD- Patient on Lovastatin as above. Most recent lipid panel (11/09/12) shows total cholesterol of 237, triglycerides of 356, HDL of 30, LDL of 136. Xanthelasmas on exam. -Zocor   OSA- Does not use CPAP at home. No previous sleep study on record.  -Patient will likely need sleep study as an outpatient.  Pulmonary HTN- Previous ECHO from 11/07/12 shows PA pressure of 64 mm Hg. Likely contributing to respiratory status at this time. -O2 as above -Lasix IV  GERD- S/p Nissen Fundoplication. Not on any medications at home. Stable at this time.   DVT/PE PPx- Heparin [place]  Dispo: Disposition is deferred at this time, awaiting improvement of current medical problems. Anticipated discharge in approximately 1-2 day(s).   The patient does have a current PCP Jessee Avers, MD) and does need an [facility] hospital follow-up appointment after discharge.  The patient does not have transportation limitations that hinder transportation to clinic appointments.  Signed: Corky Sox, MD 06/30/2013, 10:26 PM

## 2013-06-30 NOTE — ED Notes (Signed)
Pt arrived to the ED via EMS with a complaint of a fall.  Pt states's he got up and felt dizzy then fell and hit her knees and head.  Pt's EMS account differs drastically from pt's account.  Pt told EMS that her knees hurt and gave out on her and she fell.  Pt is a COPD hx and was sating at 79%.  EMS gave her 6L of O2 to get her level at 84%.  Pt put on a non re breather and is sating at 91%.

## 2013-06-30 NOTE — ED Notes (Signed)
Respiratory notified for Bipap

## 2013-07-01 ENCOUNTER — Observation Stay (HOSPITAL_COMMUNITY): Payer: Medicare HMO

## 2013-07-01 DIAGNOSIS — I2789 Other specified pulmonary heart diseases: Secondary | ICD-10-CM

## 2013-07-01 DIAGNOSIS — J441 Chronic obstructive pulmonary disease with (acute) exacerbation: Secondary | ICD-10-CM

## 2013-07-01 DIAGNOSIS — I509 Heart failure, unspecified: Secondary | ICD-10-CM | POA: Diagnosis present

## 2013-07-01 DIAGNOSIS — G4733 Obstructive sleep apnea (adult) (pediatric): Secondary | ICD-10-CM

## 2013-07-01 DIAGNOSIS — J9620 Acute and chronic respiratory failure, unspecified whether with hypoxia or hypercapnia: Secondary | ICD-10-CM

## 2013-07-01 DIAGNOSIS — I272 Pulmonary hypertension, unspecified: Secondary | ICD-10-CM

## 2013-07-01 DIAGNOSIS — F17200 Nicotine dependence, unspecified, uncomplicated: Secondary | ICD-10-CM

## 2013-07-01 DIAGNOSIS — M25562 Pain in left knee: Secondary | ICD-10-CM | POA: Diagnosis present

## 2013-07-01 DIAGNOSIS — I5033 Acute on chronic diastolic (congestive) heart failure: Secondary | ICD-10-CM

## 2013-07-01 DIAGNOSIS — W19XXXA Unspecified fall, initial encounter: Secondary | ICD-10-CM | POA: Diagnosis present

## 2013-07-01 DIAGNOSIS — M25572 Pain in left ankle and joints of left foot: Secondary | ICD-10-CM | POA: Diagnosis present

## 2013-07-01 LAB — GLUCOSE, CAPILLARY: Glucose-Capillary: 122 mg/dL — ABNORMAL HIGH (ref 70–99)

## 2013-07-01 LAB — CBC
HCT: 43.1 % (ref 36.0–46.0)
Hemoglobin: 13.3 g/dL (ref 12.0–15.0)
MCH: 30 pg (ref 26.0–34.0)
MCHC: 30.9 g/dL (ref 30.0–36.0)
MCV: 97.3 fL (ref 78.0–100.0)
PLATELETS: 141 10*3/uL — AB (ref 150–400)
RBC: 4.43 MIL/uL (ref 3.87–5.11)
RDW: 13.2 % (ref 11.5–15.5)
WBC: 7.4 10*3/uL (ref 4.0–10.5)

## 2013-07-01 LAB — RAPID URINE DRUG SCREEN, HOSP PERFORMED
AMPHETAMINES: NOT DETECTED
BARBITURATES: NOT DETECTED
Benzodiazepines: NOT DETECTED
COCAINE: NOT DETECTED
OPIATES: NOT DETECTED
Tetrahydrocannabinol: NOT DETECTED

## 2013-07-01 LAB — MRSA PCR SCREENING: MRSA by PCR: NEGATIVE

## 2013-07-01 LAB — BLOOD GAS, ARTERIAL
Acid-Base Excess: 22.4 mmol/L — ABNORMAL HIGH (ref 0.0–2.0)
BICARBONATE: 49.2 meq/L — AB (ref 20.0–24.0)
Drawn by: 31394
O2 Content: 5 L/min
O2 SAT: 90.5 %
Patient temperature: 98.6
TCO2: 52 mmol/L (ref 0–100)
pCO2 arterial: 89.4 mmHg (ref 35.0–45.0)
pH, Arterial: 7.36 (ref 7.350–7.450)
pO2, Arterial: 58.5 mmHg — ABNORMAL LOW (ref 80.0–100.0)

## 2013-07-01 LAB — MAGNESIUM: MAGNESIUM: 1.7 mg/dL (ref 1.5–2.5)

## 2013-07-01 LAB — HEPATIC FUNCTION PANEL
ALBUMIN: 3.3 g/dL — AB (ref 3.5–5.2)
ALT: 14 U/L (ref 0–35)
AST: 13 U/L (ref 0–37)
Alkaline Phosphatase: 106 U/L (ref 39–117)
BILIRUBIN TOTAL: 0.6 mg/dL (ref 0.3–1.2)
Total Protein: 6.6 g/dL (ref 6.0–8.3)

## 2013-07-01 LAB — BASIC METABOLIC PANEL
BUN: 9 mg/dL (ref 6–23)
CO2: 39 meq/L — AB (ref 19–32)
CREATININE: 0.6 mg/dL (ref 0.50–1.10)
Calcium: 9.6 mg/dL (ref 8.4–10.5)
Chloride: 92 mEq/L — ABNORMAL LOW (ref 96–112)
GFR calc Af Amer: >90 mL/min (ref >90)
GFR calc non Af Amer: >90 mL/min (ref >90)
GLUCOSE: 119 mg/dL — AB (ref 70–99)
Potassium: 4.6 mEq/L (ref 3.7–5.3)
SODIUM: 141 meq/L (ref 137–147)

## 2013-07-01 LAB — TROPONIN I

## 2013-07-01 MED ORDER — METHYLPREDNISOLONE SODIUM SUCC 125 MG IJ SOLR
60.0000 mg | Freq: Two times a day (BID) | INTRAMUSCULAR | Status: DC
  Administered 2013-07-01 – 2013-07-02 (×2): 60 mg via INTRAVENOUS
  Filled 2013-07-01 (×4): qty 0.96

## 2013-07-01 MED ORDER — IPRATROPIUM-ALBUTEROL 0.5-2.5 (3) MG/3ML IN SOLN
3.0000 mL | RESPIRATORY_TRACT | Status: DC
  Administered 2013-07-01 (×3): 3 mL via RESPIRATORY_TRACT
  Filled 2013-07-01 (×2): qty 3

## 2013-07-01 MED ORDER — SODIUM CHLORIDE 0.9 % IJ SOLN: 3.0000 mL | INTRAMUSCULAR | Status: DC | PRN

## 2013-07-01 MED ORDER — ALBUTEROL SULFATE (2.5 MG/3ML) 0.083% IN NEBU: 2.5000 mg | INHALATION_SOLUTION | RESPIRATORY_TRACT | Status: DC | PRN

## 2013-07-01 MED ORDER — ALBUTEROL SULFATE (2.5 MG/3ML) 0.083% IN NEBU: 1.2500 mg | INHALATION_SOLUTION | Freq: Four times a day (QID) | RESPIRATORY_TRACT | Status: DC | PRN

## 2013-07-01 MED ORDER — NAFCILLIN SODIUM 2 G IJ SOLR
2.0000 g | INTRAMUSCULAR | Status: DC
  Filled 2013-07-01 (×3): qty 2000

## 2013-07-01 MED ORDER — LEVOFLOXACIN IN D5W 750 MG/150ML IV SOLN
750.0000 mg | INTRAVENOUS | Status: DC
  Administered 2013-07-01: 750 mg via INTRAVENOUS
  Filled 2013-07-01: qty 150

## 2013-07-01 MED ORDER — FUROSEMIDE 10 MG/ML IJ SOLN
40.0000 mg | INTRAMUSCULAR | Status: AC
  Administered 2013-07-01: 40 mg via INTRAVENOUS
  Filled 2013-07-01: qty 4

## 2013-07-01 MED ORDER — ASPIRIN 81 MG PO CHEW
81.0000 mg | CHEWABLE_TABLET | Freq: Every day | ORAL | Status: DC
  Administered 2013-07-01 – 2013-07-03 (×3): 81 mg via ORAL
  Filled 2013-07-01 (×3): qty 1

## 2013-07-01 MED ORDER — IPRATROPIUM BROMIDE 0.02 % IN SOLN: 0.5000 mg | RESPIRATORY_TRACT | Status: DC | PRN

## 2013-07-01 MED ORDER — TIOTROPIUM BROMIDE MONOHYDRATE 18 MCG IN CAPS
18.0000 ug | ORAL_CAPSULE | Freq: Every day | RESPIRATORY_TRACT | Status: DC
  Administered 2013-07-01: 18 ug via RESPIRATORY_TRACT
  Filled 2013-07-01: qty 5

## 2013-07-01 MED ORDER — IPRATROPIUM BROMIDE 0.02 % IN SOLN: 0.5000 mg | Freq: Four times a day (QID) | RESPIRATORY_TRACT | Status: DC

## 2013-07-01 MED ORDER — FUROSEMIDE 40 MG PO TABS
40.0000 mg | ORAL_TABLET | Freq: Every day | ORAL | Status: DC
  Administered 2013-07-01 – 2013-07-03 (×3): 40 mg via ORAL
  Filled 2013-07-01 (×3): qty 1

## 2013-07-01 MED ORDER — IBUPROFEN 200 MG PO TABS
200.0000 mg | ORAL_TABLET | Freq: Once | ORAL | Status: DC
  Filled 2013-07-01: qty 1

## 2013-07-01 MED ORDER — IPRATROPIUM-ALBUTEROL 0.5-2.5 (3) MG/3ML IN SOLN
3.0000 mL | Freq: Four times a day (QID) | RESPIRATORY_TRACT | Status: DC
  Filled 2013-07-01: qty 3

## 2013-07-01 MED ORDER — IPRATROPIUM-ALBUTEROL 0.5-2.5 (3) MG/3ML IN SOLN
3.0000 mL | Freq: Four times a day (QID) | RESPIRATORY_TRACT | Status: DC
  Administered 2013-07-02 – 2013-07-03 (×6): 3 mL via RESPIRATORY_TRACT
  Filled 2013-07-01 (×6): qty 3

## 2013-07-01 MED ORDER — SODIUM CHLORIDE 0.9 % IJ SOLN
3.0000 mL | Freq: Two times a day (BID) | INTRAMUSCULAR | Status: DC
  Administered 2013-07-01 – 2013-07-02 (×4): 3 mL via INTRAVENOUS

## 2013-07-01 MED ORDER — NAFCILLIN SODIUM 2 G IJ SOLR
2.0000 g | INTRAMUSCULAR | Status: DC
  Filled 2013-07-01 (×4): qty 2000

## 2013-07-01 MED ORDER — SODIUM CHLORIDE 0.9 % IJ SOLN: 3.0000 mL | Freq: Two times a day (BID) | INTRAMUSCULAR | Status: DC

## 2013-07-01 MED ORDER — OSELTAMIVIR PHOSPHATE 75 MG PO CAPS
75.0000 mg | ORAL_CAPSULE | Freq: Two times a day (BID) | ORAL | Status: DC
  Administered 2013-07-01: 75 mg via ORAL
  Filled 2013-07-01 (×3): qty 1

## 2013-07-01 MED ORDER — SODIUM CHLORIDE 0.9 % IV SOLN: 250.0000 mL | INTRAVENOUS | Status: DC | PRN

## 2013-07-01 MED ORDER — ALBUTEROL SULFATE HFA 108 (90 BASE) MCG/ACT IN AERS: 1.0000 | INHALATION_SPRAY | RESPIRATORY_TRACT | Status: DC | PRN

## 2013-07-01 MED ORDER — SIMVASTATIN 10 MG PO TABS
10.0000 mg | ORAL_TABLET | Freq: Every day | ORAL | Status: DC
  Administered 2013-07-02: 10 mg via ORAL
  Filled 2013-07-01 (×3): qty 1

## 2013-07-01 MED ORDER — MOMETASONE FURO-FORMOTEROL FUM 100-5 MCG/ACT IN AERO
2.0000 | INHALATION_SPRAY | Freq: Two times a day (BID) | RESPIRATORY_TRACT | Status: DC
  Filled 2013-07-01: qty 8.8

## 2013-07-01 MED ORDER — ALBUTEROL SULFATE (2.5 MG/3ML) 0.083% IN NEBU: 1.2500 mg | INHALATION_SOLUTION | Freq: Four times a day (QID) | RESPIRATORY_TRACT | Status: DC

## 2013-07-01 MED ORDER — HEPARIN SODIUM (PORCINE) 5000 UNIT/ML IJ SOLN
5000.0000 [IU] | Freq: Three times a day (TID) | INTRAMUSCULAR | Status: DC
  Administered 2013-07-01 – 2013-07-03 (×7): 5000 [IU] via SUBCUTANEOUS
  Filled 2013-07-01 (×10): qty 1

## 2013-07-01 MED ORDER — IBUPROFEN 800 MG PO TABS
800.0000 mg | ORAL_TABLET | Freq: Once | ORAL | Status: AC
  Administered 2013-07-01: 800 mg via ORAL
  Filled 2013-07-01 (×2): qty 1

## 2013-07-01 MED ORDER — VANCOMYCIN HCL IN DEXTROSE 1-5 GM/200ML-% IV SOLN
1000.0000 mg | Freq: Two times a day (BID) | INTRAVENOUS | Status: DC
  Administered 2013-07-01 – 2013-07-03 (×3): 1000 mg via INTRAVENOUS
  Filled 2013-07-01 (×5): qty 200

## 2013-07-01 MED ORDER — NAFCILLIN SODIUM 2 G IJ SOLR
2.0000 g | INTRAVENOUS | Status: DC
  Administered 2013-07-02 (×3): 2 g via INTRAVENOUS
  Filled 2013-07-01 (×7): qty 2000

## 2013-07-01 NOTE — H&P (Signed)
INTERNAL MEDICINE TEACHING SERVICE Attending Admission Note  Date: 07/01/2013  Patient name: [name]  Medical record number: [id number]  Date of birth: [birth year]    I have seen and evaluated [name] and discussed their care with the Residency Team.  58 yr old woman with hx CAD s/p DES to LAD, OSA, HFpEF, PAH, HTN, COPD on 4L home O2, s/p Nissen Fundoplication, presented with fall. She states she has been increasingly fatigued at home and fell unsteady on her feet, leading to a fall. She states she hit her head and hurt knees and left ankle. She denied LOC. In the ED, she was noted to have an O2 sat of 81% on 3 L [place]. She was placed on BiPAP with improvement. She was weaned off. She admits she has not followed up with pulmonary in months due to transportation problems. CXR shows persistent patchy right basilar atelectasis vs infiltrate. A left ankle xray showed no acute fracture or subluxation but evidence of an old fracture of distal fibula as well as posterior spurring of calcaneus. A CT of the head w/o contrast was performed without acute intracranial abnormalities. Both right and left knee 4 view xrays were performed and were negative for fracture but with evidence of tricompartmental degenerative changes. Her initial ABG actually shows chronic compensated respiratory acidosis. This morning she was noted to become more somnolent with increase in O2 rate, so concern for worsening respiratory acidosis was raised. Repeat ABG shows again chronic respiratory acidosis (though the CO2 increased slightly), but with higher HCO3 than expected, indicated a component of metabolic alkalosis.   On exam,  Filed Vitals:   07/01/13 1155  BP: 139/87  Pulse: 70  Temp:   Resp: 16  O2 sat = 91% on 5 L O2 Temp = 98.4 F  GEN: NAD, somewhat confused. HEENT: EOMI, PERRL, no icterus, xanthelasma noted. CV: S1S2, no m/r/g, RRR PULM: decreased breath sounds, crackles at bases. ABD/GI: Soft, NT,  +BS, obese, no guarding. LE/UE: L ankle is tender to palpation on lateral aspect, but there is no joint instability and no significant edema. There is also no echymosis.   A/P -Acute on chronic hypoxic respiratory failure: I agree with pulmonary consult. Her confusion may indicate that she has decompensation w/ hypercarbia. In this regard, will need to allow degree of hypoxia to 88%. Agree with duoneb tx and addition of solumedrol. If begins to have worsening symptoms such as productive cough, would add abx coverage. -HFpEF: recent rise in HCO3 with metabolic alkalosis on ABG indicates she responded well to diureses. Resume home dose lasix. -Fall: Likely due to above.  Treat L ankle pain conservatively. -rest per resident note.  Dominic Pea, DO, [facility] 07/01/2013, 2:01 PM

## 2013-07-01 NOTE — Evaluation (Signed)
Physical Therapy Evaluation Patient Details Name: [name] MRN: [id number] DOB: 1955-07-13 Today's Date: 07/01/2013   History of Present Illness  [name] is a 58 y.o. female w/ PMHx of HTN, HLD, COPD (on 4L home O2), CAD (DES to LAD, 2010), dCHF, PAH, OSA, and GERD s/p Nissen Fundoplication, presents to the [facility] ED w/ complaints of recent fall at home. On arrival to the ED, patient was noted to have SpO2 of 82%, but she denied any significant change from her baseline SOB as she has severe COPD and uses 4L O2.  Clinical Impression  Patient presents with problems listed below.  Will benefit from acute PT to maximize independence prior to discharge home with grandson.  Patient unable to attempt ambulation today due to decreased O2 sats with transfers and feeling lightheaded.  Was able to transfer bed <> chair with min assist.      Follow Up Recommendations Home health PT;Supervision/Assistance - 24 hour    Equipment Recommendations  None recommended by PT    Recommendations for Other Services       Precautions / Restrictions Precautions Precautions: Fall Precaution Comments: On O2 at 4 l/min at home. Restrictions Weight Bearing Restrictions: No      Mobility  Bed Mobility Overal bed mobility: Needs Assistance Bed Mobility: Supine to Sit;Sit to Supine     Supine to sit: Min guard Sit to supine: Min guard   General bed mobility comments: Patient using bed rail to move to sitting.  Once upright, patient reports feeling very lightheaded.  O2 sats at 90% on 5 liters O2.  At end of session, patient able to move to supine with min guard assist.  Transfers Overall transfer level: Needs assistance Equipment used: None Transfers: Sit to/from [facility] Sit to Stand: Min assist Stand pivot transfers: Min assist       General transfer comment: Verbal cues for hand placement and technique.  Assist to rise to standing and for balance.  Patient able  to bear min weight on LLE.  Able to take several steps to pivot to [facility].  Patient reports feeling lightheaded - O2 sats at 88%.  Pivoted back to bed with min assist.  Patient unable to attempt standing/ambulation due to lightheadedness and O2 sats at 88% with sitting.  Patient states she needs to lay down - returned to supine.  Encouraged patient to elevate HOB.  At 23*, patient with decreased O2 sats to 87%.  States feels better with head down.  Returned bed to 25*.  RN notified.  Ambulation/Gait             General Gait Details: Unable to attempt due to lightheadedness, decreased O2 sats, and Lt ankle pain.  Stairs            Wheelchair Mobility    Modified Rankin (Stroke Patients Only)       Balance                                     Pertinent Vitals/Pain Pain 8/10 in Lt ankle with mobility. O2 sats dropped to 87% with mobility/transfers.  Patient with lightheadedness and difficulty remaining awake.  O2 on during session at 5 l/min.    Home Living Family/patient expects to be discharged to:: Private residence Living Arrangements: Other relatives ([place] (59 yo)) Available Help at Discharge: Family;Available 24 hours/day Type of Home: House Home Access: Stairs  to enter Entrance Stairs-Rails: None Entrance Stairs-Number of Steps: 1 Home Layout: One level Home Equipment: Tub bench;Wheelchair - [employer] - 2 wheels;Cane - single point      Prior Function Level of Independence: Independent         Comments: Uses O2 at home     Hand Dominance        Extremity/Trunk Assessment   Upper Extremity Assessment: Generalized weakness           Lower Extremity Assessment: Generalized weakness;LLE deficits/detail   LLE Deficits / Details: Patient with decreased strength and ROM Lt ankle due to pain     Communication   Communication: No difficulties  Cognition Arousal/Alertness: Lethargic (Difficulty staying awake) Behavior During  Therapy: Flat affect Overall Cognitive Status: Within Functional Limits for tasks assessed                      General Comments      Exercises        Assessment/Plan    PT Assessment Patient needs continued PT services  PT Diagnosis Difficulty walking;Generalized weakness;Acute pain   PT Problem List Decreased strength;Decreased activity tolerance;Decreased balance;Decreased mobility;Decreased knowledge of use of DME;Cardiopulmonary status limiting activity;Obesity;Pain  PT Treatment Interventions DME instruction;Gait training;Functional mobility training;Therapeutic exercise;Patient/family education   PT Goals (Current goals can be found in the Care Plan section) Acute Rehab PT Goals Patient Stated Goal: To feel better PT Goal Formulation: With patient Time For Goal Achievement: 07/08/13 Potential to Achieve Goals: Good    Frequency Min 4X/week   Barriers to discharge        End of Session Equipment Utilized During Treatment: Gait belt;Oxygen Activity Tolerance: Patient limited by lethargy;Patient limited by pain;Treatment limited secondary to medical complications (Comment) (Decreased O2 sats) Patient left: in bed;with call bell/phone within reach    Functional Assessment Tool Used: Clinical judgement Functional Limitation: Mobility: Walking and moving around Mobility: Walking and Moving Around Current Status ([id number]): At least 20 percent but less than 40 percent impaired, limited or restricted Mobility: Walking and Moving Around Goal Status [phone]): At least 1 percent but less than 20 percent impaired, limited or restricted    Time: 1153-1216 PT Time Calculation (min): 23 min   Charges:   PT Evaluation $Initial PT Evaluation Tier I: 1 Procedure PT Treatments $Therapeutic Activity: 8-22 mins   PT G Codes:   Functional Assessment Tool Used: Clinical judgement Functional Limitation: Mobility: Walking and moving around    Despina Pole 07/01/2013, 12:58  PM Carita Pian. Sanjuana Kava, [place] Pager [phone]

## 2013-07-01 NOTE — Progress Notes (Addendum)
Subjective: Patient seen and examined at the bedside this morning. She is quite somnolent, but arousable. Initially satting 86% on 4L [place] and breathing about 10 breaths per minute, but upon arousal and increasing O2 to 5L, her sats increased to 90-95%. We have been trying to get her to see pulm for 8 months. She tells [place] she has missed her appointments due to transportation issues. She takes Spiriva and Albuterol prn at home but no Advair/Dulera. Denies starting any new medications last night. She says her fall was because she "got dizzy and tripped".  Objective: Vital signs in last 24 hours: Filed Vitals:   07/01/13 0434 07/01/13 0446 07/01/13 0730 07/01/13 0828  BP:  145/70 142/75   Pulse:  78 85   Temp:  98.4 F (36.9 C) 98.4 F (36.9 C)   TempSrc:  Oral Oral   Resp:  18 15   Height:      Weight: 244 lb 7.8 oz (110.9 kg)     SpO2:  95% 93% 92%   Weight change:   Intake/Output Summary (Last 24 hours) at 07/01/13 1127 Last data filed at 07/01/13 1100  Gross per 24 hour  Intake    363 ml  Output   2800 ml  Net  -2437 ml   General: Vital signs reviewed. Patient is an obese female, in no acute distress and cooperative with exam. Quite lethargic on exam.  Head: Normocephalic and atraumatic.  Eyes: PERRL, EOMI, conjunctivae normal, No scleral icterus. Xanthelasma.  Neck: Supple, trachea midline, normal ROM, No JVD, masses, thyromegaly, or carotid bruit present.  Cardiovascular: RRR, S1 normal, S2 normal, no murmurs, gallops, or rubs.  Pulmonary/Chest: Air entry equal bilaterally, no wheezes. Bibasilar crackles.  Abdominal: Soft, non-tender, non-distended, BS +, no masses, organomegaly, or guarding present.  Musculoskeletal: No joint deformities, erythema, or stiffness, ROM full and nontender.  Extremities: No swelling or edema, pulses symmetric and intact bilaterally. No cyanosis or clubbing. Left ankle tenderness to palpation, full range if motion.  Neurological: A&O x3, Strength  is normal and symmetric bilaterally, cranial nerve II-XII are grossly intact, no focal motor deficit, sensory intact to light touch bilaterally.  Skin: Warm, dry and intact. No rashes or erythema.  Psychiatric: Normal mood and affect. speech and behavior is normal. Cognition and memory are normal.   Lab Results: Basic Metabolic Panel:  Recent Labs Lab 06/30/13 2008 07/01/13 0500  NA 139 141  K 4.7 4.6  CL 92* 92*  CO2 41* 39*  GLUCOSE 120* 119*  BUN 8 9  CREATININE 0.57 0.60  CALCIUM 9.5 9.6  MG  --  1.7   Liver Function Tests:  Recent Labs Lab 07/01/13 0500  AST 13  ALT 14  ALKPHOS 106  BILITOT 0.6  PROT 6.6  ALBUMIN 3.3*   No results found for this basename: LIPASE, AMYLASE,  in the last 168 hours No results found for this basename: AMMONIA,  in the last 168 hours CBC:  Recent Labs Lab 06/30/13 2008 07/01/13 0500  WBC 5.9 7.4  NEUTROABS 4.6  --   HGB 13.4 13.3  HCT 43.9 43.1  MCV 98.2 97.3  PLT 150 141*   Cardiac Enzymes:  Recent Labs Lab 06/30/13 2008 07/01/13 0500  TROPONINI <0.30 <0.30   BNP: No results found for this basename: PROBNP,  in the last 168 hours D-Dimer: No results found for this basename: DDIMER,  in the last 168 hours CBG:  Recent Labs Lab 07/01/13 0609  GLUCAP 122*  Hemoglobin A1C: No results found for this basename: HGBA1C,  in the last 168 hours Fasting Lipid Panel: No results found for this basename: CHOL, HDL, LDLCALC, TRIG, CHOLHDL, LDLDIRECT,  in the last 168 hours Thyroid Function Tests: No results found for this basename: TSH, T4TOTAL, FREET4, T3FREE, THYROIDAB,  in the last 168 hours Coagulation: No results found for this basename: LABPROT, INR,  in the last 168 hours Anemia Panel: No results found for this basename: VITAMINB12, FOLATE, FERRITIN, TIBC, IRON, RETICCTPCT,  in the last 168 hours Urine Drug Screen: Drugs of Abuse     Component Value Date/Time   LABOPIA NEGATIVE 02/04/2009 [place] 02/04/2009 [place] 02/04/2009 2315   AMPHETMU NEGATIVE 02/04/2009 2315    Alcohol Level: No results found for this basename: ETH,  in the last 168 hours Urinalysis: No results found for this basename: COLORURINE, APPERANCEUR, LABSPEC, PHURINE, GLUCOSEU, HGBUR, BILIRUBINUR, KETONESUR, PROTEINUR, UROBILINOGEN, NITRITE, LEUKOCYTESUR,  in the last 168 hours Misc. Labs:  Micro Results: Recent Results (from the past 240 hour(s))  MRSA PCR SCREENING     Status: None   Collection Time    07/01/13  2:08 AM      Result Value Ref Range Status   MRSA by PCR NEGATIVE  NEGATIVE Final   Comment:            The GeneXpert MRSA Assay (FDA     approved for NASAL specimens     only), is one component of a     comprehensive MRSA colonization     surveillance program. It is not     intended to diagnose MRSA     infection nor to guide or     monitor treatment for     MRSA infections.   Studies/Results: Dg Chest 2 View  07/01/2013   CLINICAL DATA:  COPD, sleep apnea  EXAM: CHEST  2 VIEW  COMPARISON:  06/30/2013  FINDINGS: Cardiomediastinal silhouette is stable. Persistent patchy right basilar atelectasis or infiltrate. No pulmonary edema.  IMPRESSION: Cardiomegaly. Persistent patchy right basilar atelectasis or infiltrate. No pulmonary edema.   Electronically Signed   By: Lahoma Crocker M.D.   On: 07/01/2013 10:22   Dg Chest 2 View  06/30/2013   CLINICAL DATA:  Short of breath.  EXAM: CHEST  2 VIEW  COMPARISON:  05/14/2013  FINDINGS: Cardiac silhouette is mildly enlarged. Normal mediastinal contours. Stable prominent pulmonary arteries.  Lungs are hyperexpanded. There is increased opacity at the right lung base when compared to the prior exam. This silhouettes the portion of the right lateral posterior hemidiaphragm.  There are prominent bronchovascular markings similar to the prior exam as well as mild bilateral interstitial thickening.  No pleural effusion or pneumothorax.  The bony  thorax is demineralized but intact.  IMPRESSION: 1. New right lower lobe opacity. This suggests pneumonia in the proper clinical setting. It could be due to atelectasis. 2. Lung hyperexpansion with prominent bronchovascular and mildly thickened interstitial markings. These findings are stable from the prior study. 3. Mild stable cardiomegaly.   Electronically Signed   By: Lajean Manes M.D.   On: 06/30/2013 20:35   Dg Ankle Complete Left  07/01/2013   CLINICAL DATA:  Fall, left ankle pain and swelling  EXAM: LEFT ANKLE COMPLETE - 3+ VIEW  COMPARISON:  10/24/2011  FINDINGS: Three views of left ankle submitted. No acute fracture or subluxation. Ankle mortise is preserved. There is plantar and posterior spurring of calcaneus. Again noted  old fracture or distal left fibula.  IMPRESSION: No acute fracture or subluxation. Plantar and posterior spurring of calcaneus. Old fracture of distal fibula.   Electronically Signed   By: Lahoma Crocker M.D.   On: 07/01/2013 09:50   Ct Head Wo Contrast  06/30/2013   CLINICAL DATA:  Dizzy.  Near Fall.  EXAM: CT HEAD WITHOUT CONTRAST  TECHNIQUE: Contiguous axial images were obtained from the base of the skull through the vertex without intravenous contrast.  COMPARISON:  None.  FINDINGS: Ventricles are normal in size and configuration. There are no parenchymal masses or mass effect. There is no evidence of an infarct. Minimal white matter hypoattenuation is noted consistent with chronic microvascular ischemic change.  No extra-axial masses or abnormal fluid collections.  There is no intracranial hemorrhage.  Visualized sinuses and mastoid air cells are clear.  IMPRESSION: 1. No acute intracranial abnormalities. 2. Minimal chronic microvascular ischemic change. 3. No other abnormalities.   Electronically Signed   By: Lajean Manes M.D.   On: 06/30/2013 20:44   Dg Knee Complete 4 Views Left  06/30/2013   CLINICAL DATA:  Fall, bilateral knee pain  EXAM: LEFT KNEE - COMPLETE 4+ VIEW   COMPARISON:  Concurrently obtained radiographs of the contralateral right knee  FINDINGS: There is no evidence of fracture, dislocation, or joint effusion. There is no evidence of arthropathy or other focal bone abnormality. Incidental note is made of a fabella. Minimal tricompartmental degenerative changes. Soft tissues are unremarkable. Trace atherosclerotic calcification along the course of the superficial femoral artery.  IMPRESSION: 1. No acute fracture, malalignment or joint effusion. 2. Minimal tricompartmental degenerative changes. 3. Trace atherosclerotic calcification along the course of the superficial femoral artery.   Electronically Signed   By: Jacqulynn Cadet M.D.   On: 06/30/2013 20:39   Dg Knee Complete 4 Views Right  06/30/2013   CLINICAL DATA:  Fall, bilateral knee pain  EXAM: RIGHT KNEE - COMPLETE 4+ VIEW  COMPARISON:  Concurrently obtained radiographs of the contralateral left knee. Prior right knee MRI 03/04/2006  FINDINGS: There is no evidence of fracture, dislocation, or joint effusion. There is no evidence of arthropathy or other focal bone abnormality. Minimal tricompartmental degenerative change. Incidental note is made of a fabella. Soft tissues are unremarkable.  IMPRESSION: Negative.  Minimal tricompartmental degenerative changes.   Electronically Signed   By: Jacqulynn Cadet M.D.   On: 06/30/2013 20:40   Medications: I have reviewed the patient's current medications. Scheduled Meds: . albuterol  1.25 mg Nebulization Q6H  . aspirin  81 mg Oral Daily  . furosemide  40 mg Oral Daily  . heparin  5,000 Units Subcutaneous 3 times per day  . ipratropium  0.5 mg Nebulization Q6H  . mometasone-formoterol  2 puff Inhalation BID  . simvastatin  10 mg Oral q1800  . sodium chloride  3 mL Intravenous Q12H  . sodium chloride  3 mL Intravenous Q12H   Continuous Infusions:  PRN Meds:.sodium chloride, sodium chloride  Assessment/Plan: [name] is a 58 y.o. female w/  PMHx of HTN, HLD, COPD (on 4L home O2), CAD (DES to LAD, 2010), dCHF, PAH, OSA, and GERD s/p Nissen Fundoplication, admitted for suspected COPD exacerbation.   Acute on Chronic Hypoxic Respiratory Failure - Patient w/ h/o severe COPD, Gold stage 4, on 4L O2 via [place] at home. Satting 88-92% on 4L [place] here. She says her SOB is at baseline. Suspect a large element of this is her baseline, chronic respiratory  failure 2/2 COPD, she has been unable to follow up with pulmonology or pulmonary rehab due to transportation issues. (We are actively working to get her SCAT in the clinic.) We are holding off on steroids for now. CXR shows atelectasis vs. Infiltrate but do not suspect pneumonia given no leukocytosis, no h/o cough, no significant change from baseline SOB. Holding off on antibiotics for now. - Consult to pulmonology for further management recommendations and to get her linked into their system > ADDENDUM: Spoke with PA Darlina Sicilian, patient more somnolent on her exam, concerning for acute on chronic hypercarbic respiratory failure, will repeat ABG, proceed with BiPAP, give O2 to keep sats 88-92% (do not want to suppress respiratory drive), and add steroids for AECOPD - Duonebs q6h prn  - Starting Dulera 2 puffs BID - Flutter valve and consult to RT for teaching - UDS given somnolence - Consult to social work given transportation needs - PT eval and treat - BMP, CBC in am  dCHF - Mild volume overload 2/2 lasix noncompliance likely also playing a role in her respiratory failure, we are diuresing her. 2D echo on 11/07/12 showed EF of 98-26%, normal systolic function. Paradoxical septal motion, pulmonary artery pressure of 64 mm Hg (see below). Troponins have been negative. She is s/p 45m IV Lasix. Repeat CXR this am was without pulmonary edema. - Continue home Lasix 40 mg po qd   [place] her chronic hypoxia/COPD is contributing. Bilateral knee XR show mild degenerative changes,  otherwise completely normal. CT head w/out acute findings. Still w/ left ankle pain but ROM intact and XR shows no acute fracture or subluxation. - Ibuprofen prn pain  - Ice pack for left ankle   HTN - Normotensive on admission.  - Lasix as above  CAD- Previous cardiac catheterization on 11/05/12 showed moderate PAH, likely d/t COPD + OSA. In 12/2008, patient had DES to proximal LAD w/ total RCA occlusion. On ASA 81 + Lovastatin 20 mg qhs.  - Continue ASA + Zocor   HLD- Patient on Lovastatin as above. Most recent lipid panel (11/09/12) shows total cholesterol of 237, triglycerides of 356, HDL of 30, LDL of 136. Xanthelasmas on exam.  - Continue Zocor   OSA - Does not use CPAP at home. No previous sleep study on record.  -Patient will likely need sleep study as an outpatient if we can get her transportation needs worked out  Pulmonary HTN- Previous ECHO from 11/07/12 shows PA pressure of 64 mm Hg. Likely contributing to respiratory status at this time.  - O2 as above  - Lasix as above  GERD - S/p Nissen Fundoplication. Not on any medications at home. Stable at this time.   DVT/PE PPx - Heparin [place]   Dispo: Disposition is deferred at this time, awaiting improvement of current medical problems.  Anticipated discharge in approximately 1-3 day(s).   The patient does have a current PCP (Jessee Avers MD) and [facility].  The patient does not have transportation limitations that hinder transportation to clinic appointments.  .Services Needed at time of discharge: Y = Yes, Blank = No PT:   OT:   RN:   Equipment:   Other:     LOS: 1 day   SLesly Dukes MD 07/01/2013, 11:27 AM

## 2013-07-01 NOTE — ED Notes (Signed)
[facility] called for patient transport

## 2013-07-01 NOTE — Consult Note (Signed)
PULMONARY / CRITICAL CARE MEDICINE  Name: [name] MRN: [id number] DOB: 02-Jun-1955    ADMISSION DATE:  06/30/2013 CONSULTATION DATE:  07/01/2013  REFERRING MD :  Graciella Freer PRIMARY SERVICE:  [facility]  CHIEF COMPLAINT:  Acute on chronic respiratory failure   BRIEF PATIENT DESCRIPTION: 58 yo female with hx COPD ( 4L oxygen ), PAH, diastolic CHF, OSA (noncompliant with CPAP) admitted 3/29 by IMT[facility] with multifactorial acute on chronic respiratory failure.    SIGNIFICANT EVENTS / STUDIES:  3/29  CT head  >>> neg   LINES / TUBES:  CULTURES: 3/29  Blood >>>  ANTIBIOTICS: Rocephin 3/29 >>> Azithromycin  3/29 >>>  HISTORY OF PRESENT ILLNESS:  58 yo female with hx COPD (4L home O2), HTN, PAH, dCHF, OSA admitted 3/29 by IMTS after a fall at home.  She c/o 2 weeks increased SOB and significant malaise x 2 days.  She has not taken her lasix x 2 days.  She got up to go to the bathroom and had a mechanical fall, no LOC, no syncope which prompted her to seek care.  She was significantly hypoxic on arrival to ER and quickly improved with bipap.  Now in SDU.  Denies chest pain, lightheadedness, hemoptysis, purulent sputum.  SOB worse over past 2 weeks.  Does endorse orthopnea, some increased BLE edema. Difficult to obtain hpi - falls asleep quickly.   PAST MEDICAL HISTORY :  Past Medical History  Diagnosis Date  . Coronary artery disease     S/P PCI of LAD with DES (12/2008). Total occlusion of RCA noted at that time., medically managed. ACS ruled out 03/2009 with Lexiscan myoview . Followed by [facility].  . Pulmonary hypertension     2-D Echo (85/8850) - Systolic pressure was moderately increased. PA peak pressure  14mHg. 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 Followed by Dr. WJoya Gaskins(Vel[facility] Heckler  . Diastolic dysfunction     2-D Echo (12/2008) - Normal LV Systolic funciton with EF 60-65%. Grade 1  diastolid dysfunction. No regional wall motion abnormalities. Moderate pulmonary HTN with PA peak pressure 580mg.  . Marland KitchenOPD (chronic obstructive pulmonary disease)     Severe. Gold Stage IV.  PFTs (12/2008) - severe obstructive airway disease. Active tobacco use. Requires 4L O2 at home.  . Pulmonary nodule, right     Small right middle lobe nodule. Stable as of 12/2008.  . Marland Kitchenleep apnea     Presumptive. No documented sleep studies.   . Prediabetes 12/2008    HgbA1c 6.4 (12/2008)  . Hx MRSA infection     Recurrent MRSA thigh abscesses.  . Tobacco abuse     Ongoing.  . Obesity   . Hyperlipidemia   . GERD (gastroesophageal reflux disease)     S/P Nissen fundoplication.  . Shortness of breath   . CHF (congestive heart failure)    Past Surgical History  Procedure Laterality Date  . Total abdominal hysterectomy w/ bilateral salpingoophorectomy    . Nissen fundoplication     Prior to Admission medications   Medication Sig Start Date End Date Taking? Authorizing Provider  albuterol (ACCUNEB) 1.25 MG/3ML nebulizer solution Take 1 ampule by nebulization every 6 (six) hours as needed for wheezing.   Yes Historical Provider, MD  albuterol (PROVENTIL HFA;VENTOLIN HFA) 108 (90 BASE) MCG/ACT inhaler Inhale 1 puff into the lungs every 4 (four) hours as needed for wheezing or shortness of breath. 06/13/13  Yes Jessee Avers, MD  aspirin 81 MG chewable tablet Chew 1 tablet (81 mg total) by mouth daily. 11/09/12  Yes Na Li, MD  furosemide (LASIX) 20 MG tablet Take 40 mg by mouth 2 (two) times daily. 06/12/13  Yes Jessee Avers, MD  ipratropium (ATROVENT) 0.02 % nebulizer solution Take 2.5 mLs (0.5 mg total) by nebulization every 4 (four) hours as needed for wheezing. 01/09/13 01/09/14 Yes Jessee Avers, MD  lovastatin (MEVACOR) 20 MG tablet Take 1 tablet (20 mg total) by mouth at bedtime. 06/12/13  Yes Jessee Avers, MD  tiotropium (SPIRIVA HANDIHALER) 18 MCG inhalation capsule Place 1 capsule (18 mcg  total) into inhaler and inhale daily. 06/12/13  Yes Jessee Avers, MD   Allergies  Allergen Reactions  . Fluconazole Anaphylaxis    DIFLUCAN   FAMILY HISTORY:  Family History  Problem Relation Age of Onset  . Heart disease Mother 5    Deceased from MI at 52yo  . Hypertension Mother   . Heart disease Father 24    Deceased of MI age 58yo  . Hypertension Father   . Hypertension Brother   . Lung cancer      Grandmother   SOCIAL HISTORY:  reports that she has been smoking Cigarettes.  She has a 20 pack-year smoking history. She has never used smokeless tobacco. She reports that she does not drink alcohol or use illicit drugs.  REVIEW OF SYSTEMS:  Unable to provide.  VITAL SIGNS: Temp:  [98.4 F (36.9 C)-99.2 F (37.3 C)] 98.4 F (36.9 C) (03/30 0730) Pulse Rate:  [78-92] 85 (03/30 0730) Resp:  [15-27] 15 (03/30 0730) BP: (117-164)/(66-106) 142/75 mmHg (03/30 0730) SpO2:  [81 %-95 %] 92 % (03/30 0828) FiO2 (%):  [45 %] 45 % (03/29 2117) Weight:  [244 lb 7.8 oz (110.9 kg)-244 lb 14.9 oz (111.1 kg)] 244 lb 7.8 oz (110.9 kg) (03/30 0434)  PHYSICAL EXAMINATION: General:  Chronically ill appearing female, NAD  Neuro:  Somnolent, arousable and appropriate but quickly falls asleep, appears hypercarbic HEENT:  Mm dry, no JVD Cardiovascular:  s1s2 rrr Lungs:  resps even, shallow, diminished throughout, few bibasilar crackles, exp wheeze  Abdomen:  Soft, =bs Musculoskeletal:  Warm and dry, 1+ BLE edema   LABS: CBC  Recent Labs Lab 06/30/13 2008 07/01/13 0500  WBC 5.9 7.4  HGB 13.4 13.3  HCT 43.9 43.1  PLT 150 141*   Coag's No results found for this basename: APTT, INR,  in the last 168 hours BMET  Recent Labs Lab 06/30/13 2008 07/01/13 0500  NA 139 141  K 4.7 4.6  CL 92* 92*  CO2 41* 39*  BUN 8 9  CREATININE 0.57 0.60  GLUCOSE 120* 119*   Electrolytes  Recent Labs Lab 06/30/13 2008 07/01/13 0500  CALCIUM 9.5 9.6  MG  --  1.7   Sepsis Markers No  results found for this basename: LATICACIDVEN, PROCALCITON, O2SATVEN,  in the last 168 hours ABG  Recent Labs Lab 06/30/13 2038 07/01/13 1200  PHART 7.321* 7.360  PCO2ART 85.4* 89.4*  PO2ART 54.9* 58.5*   Liver Enzymes  Recent Labs Lab 07/01/13 0500  AST 13  ALT 14  ALKPHOS 106  BILITOT 0.6  ALBUMIN 3.3*   Cardiac Enzymes  Recent Labs Lab 06/30/13 2008 07/01/13 0500  TROPONINI <0.30 <0.30   Glucose  Recent Labs Lab 07/01/13 0609  GLUCAP 122*   IMAGING: Dg Chest 2 View  07/01/2013   CLINICAL DATA:  COPD, sleep apnea  EXAM: CHEST  2 VIEW  COMPARISON:  06/30/2013  FINDINGS: Cardiomediastinal silhouette is stable. Persistent patchy right basilar atelectasis or infiltrate. No pulmonary edema.  IMPRESSION: Cardiomegaly. Persistent patchy right basilar atelectasis or infiltrate. No pulmonary edema.   Electronically Signed   By: Lahoma Crocker M.D.   On: 07/01/2013 10:22   Dg Chest 2 View  06/30/2013   CLINICAL DATA:  Short of breath.  EXAM: CHEST  2 VIEW  COMPARISON:  05/14/2013  FINDINGS: Cardiac silhouette is mildly enlarged. Normal mediastinal contours. Stable prominent pulmonary arteries.  Lungs are hyperexpanded. There is increased opacity at the right lung base when compared to the prior exam. This silhouettes the portion of the right lateral posterior hemidiaphragm.  There are prominent bronchovascular markings similar to the prior exam as well as mild bilateral interstitial thickening.  No pleural effusion or pneumothorax.  The bony thorax is demineralized but intact.  IMPRESSION: 1. New right lower lobe opacity. This suggests pneumonia in the proper clinical setting. It could be due to atelectasis. 2. Lung hyperexpansion with prominent bronchovascular and mildly thickened interstitial markings. These findings are stable from the prior study. 3. Mild stable cardiomegaly.   Electronically Signed   By: Lajean Manes M.D.   On: 06/30/2013 20:35   Dg Ankle Complete  Left  07/01/2013   CLINICAL DATA:  Fall, left ankle pain and swelling  EXAM: LEFT ANKLE COMPLETE - 3+ VIEW  COMPARISON:  10/24/2011  FINDINGS: Three views of left ankle submitted. No acute fracture or subluxation. Ankle mortise is preserved. There is plantar and posterior spurring of calcaneus. Again noted old fracture or distal left fibula.  IMPRESSION: No acute fracture or subluxation. Plantar and posterior spurring of calcaneus. Old fracture of distal fibula.   Electronically Signed   By: Lahoma Crocker M.D.   On: 07/01/2013 09:50   Ct Head Wo Contrast  06/30/2013   CLINICAL DATA:  Dizzy.  Near Fall.  EXAM: CT HEAD WITHOUT CONTRAST  TECHNIQUE: Contiguous axial images were obtained from the base of the skull through the vertex without intravenous contrast.  COMPARISON:  None.  FINDINGS: Ventricles are normal in size and configuration. There are no parenchymal masses or mass effect. There is no evidence of an infarct. Minimal white matter hypoattenuation is noted consistent with chronic microvascular ischemic change.  No extra-axial masses or abnormal fluid collections.  There is no intracranial hemorrhage.  Visualized sinuses and mastoid air cells are clear.  IMPRESSION: 1. No acute intracranial abnormalities. 2. Minimal chronic microvascular ischemic change. 3. No other abnormalities.   Electronically Signed   By: Lajean Manes M.D.   On: 06/30/2013 20:44   Dg Knee Complete 4 Views Left  06/30/2013   CLINICAL DATA:  Fall, bilateral knee pain  EXAM: LEFT KNEE - COMPLETE 4+ VIEW  COMPARISON:  Concurrently obtained radiographs of the contralateral right knee  FINDINGS: There is no evidence of fracture, dislocation, or joint effusion. There is no evidence of arthropathy or other focal bone abnormality. Incidental note is made of a fabella. Minimal tricompartmental degenerative changes. Soft tissues are unremarkable. Trace atherosclerotic calcification along the course of the superficial femoral artery.   IMPRESSION: 1. No acute fracture, malalignment or joint effusion. 2. Minimal tricompartmental degenerative changes. 3. Trace atherosclerotic calcification along the course of the superficial femoral artery.   Electronically Signed   By: Jacqulynn Cadet M.D.   On: 06/30/2013 20:39   Dg Knee Complete 4 Views Right  06/30/2013   CLINICAL DATA:  Fall, bilateral knee pain  EXAM: RIGHT KNEE - COMPLETE 4+ VIEW  COMPARISON:  Concurrently obtained radiographs of the contralateral left knee. Prior right knee MRI 03/04/2006  FINDINGS: There is no evidence of fracture, dislocation, or joint effusion. There is no evidence of arthropathy or other focal bone abnormality. Minimal tricompartmental degenerative change. Incidental note is made of a fabella. Soft tissues are unremarkable.  IMPRESSION: Negative.  Minimal tricompartmental degenerative changes.   Electronically Signed   By: Jacqulynn Cadet M.D.   On: 06/30/2013 20:40   ASSESSMENT / PLAN:  Acute on chronic respiratory failure   Supplemental oxygen for SpO2>92  BiPAP qhs and PRN  ABG  Acute on chronic diastolic congestive heart failure  Per primary team  Lasix 40 mg PO daily  Acute exacerbation of COPD GOLD stage IV based on need for supplemental oxygen  Start DuoNeb and Albuterol PRN  Add Levaquin   Solu-Medrol 60 mg Iv q6h  Flutter valve / pulmonary hygiene  Pulmonary hypertension, secondary to pulmonary / cardiac disease  No specific treatment indicated, treat underlying disease   Obstructive sleep apnea, presumed - no polysomnography, noncompliant wit CPAP  Would recommend using BiPAP in stead of CPAP given hypercarbia  Needs polysomnography as outpatient  Encourage compliance  Tobacco use disorder  Tobacco avoidance  WHITEHEART,KATHRYN, NP 07/01/2013  11:21 AM Pager: [phone] or [phone]  I have personally obtained history, examined patient, evaluated and interpreted laboratory and imaging results, reviewed medical  records, formulated assessment / plan and placed orders.  Doree Fudge, MD [facility] [place] Pager: [phone]  07/01/2013, 1:19 PM

## 2013-07-01 NOTE — Consult Note (Addendum)
ANTIBIOTIC CONSULT NOTE - INITIAL  Pharmacy Consult for Levaquin Indication: COPD exacerbation  Allergies  Allergen Reactions  . Fluconazole Anaphylaxis    DIFLUCAN    Patient Measurements: Height: _0  (165.1 cm) Weight: 244 lb 7.8 oz (110.9 kg) IBW/kg (Calculated) : 57  Vital Signs: Temp: 98.4 F (36.9 C) (03/30 0730) Temp src: Oral (03/30 0730) BP: 139/87 mmHg (03/30 1155) Pulse Rate: 70 (03/30 1155) Intake/Output from previous day: 03/29 0701 - 03/30 0700 In: 240 [P.O.:240] Out: 1975 [Urine:1975] Intake/Output from this shift: Total I/O In: 123 [P.O.:120; I.V.:3] Out: 1775 [Urine:1775]  Labs:  Recent Labs  06/30/13 2008 07/01/13 0500  WBC 5.9 7.4  HGB 13.4 13.3  PLT 150 141*  CREATININE 0.57 0.60   Estimated Creatinine Clearance: 94 ml/min (by C-G formula based on Cr of 0.6).  Microbiology: Recent Results (from the past 720 hour(s))  MRSA PCR SCREENING     Status: None   Collection Time    07/01/13  2:08 AM      Result Value Ref Range Status   MRSA by PCR NEGATIVE  NEGATIVE Final   Comment:            The GeneXpert MRSA Assay (FDA     approved for NASAL specimens     only), is one component of a     comprehensive MRSA colonization     surveillance program. It is not     intended to diagnose MRSA     infection nor to guide or     monitor treatment for     MRSA infections.    Medical History: Past Medical History  Diagnosis Date  . Coronary artery disease     S/P PCI of LAD with DES (12/2008). Total occlusion of RCA noted at that time., medically managed. ACS ruled out 03/2009 with Lexiscan myoview . Followed by [facility].  . Pulmonary hypertension     2-D Echo (41/2878) - Systolic pressure was moderately increased. PA peak pressure  12mHg. 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 Followed by Dr. WJoya Gaskins(Velora Heckler  . Diastolic dysfunction     2-D Echo (12/2008) - Normal LV Systolic funciton with EF 60-65%. Grade 1 diastolid dysfunction. No regional wall motion abnormalities. Moderate pulmonary HTN with PA peak pressure 535mg.  . Marland KitchenOPD (chronic obstructive pulmonary disease)     Severe. Gold Stage IV.  PFTs (12/2008) - severe obstructive airway disease. Active tobacco use. Requires 4L O2 at home.  . Pulmonary nodule, right     Small right middle lobe nodule. Stable as of 12/2008.  . Marland Kitchenleep apnea     Presumptive. No documented sleep studies.   . Prediabetes 12/2008    HgbA1c 6.4 (12/2008)  . Hx MRSA infection     Recurrent MRSA thigh abscesses.  . Tobacco abuse     Ongoing.  . Obesity   . Hyperlipidemia   . GERD (gastroesophageal reflux disease)     S/P Nissen fundoplication.  . Shortness of breath   . CHF (congestive heart failure)    Assessment: 58yom to begin levaquin for COPD exacerbation. CXR shows persistent patchy right basilar atelectasis or infiltrate. Renal function wnl.  Goal of Therapy:  Appropriate levaquin dosing  Plan:  1) Levaquin 75021mV q24 2) Follow renal function, clinical progress, LOT  MarDeboraha Sprang30/2015,1:39 PM  Addendum: Adding Vancomycin to her regimen.  She is morbidly obese with  TBW of 110 kg.  She has an estimated crcl of 94 ml/min.  Will use weight adjusted dosing for her.  Plan: Begin Vancomycin 1gm IV every 12 hours. F/U renal function and UOP Monitor micro data and de-escalate antibiotics as appropriate.  Rober Minion, PharmD., MS Clinical Pharmacist Pager:  [phone] Thank you for allowing pharmacy to be part of this patients care team.

## 2013-07-01 NOTE — Progress Notes (Signed)
Lab called with ABG critical value, pH 7.36, Pco2 89.4, Po2 58.5, Bicarb 49.2. Called and spoke with Dr. Eulas Post with Internal Medicine. No new orders given at this time, will continue to monitor.

## 2013-07-01 NOTE — Progress Notes (Addendum)
UR completed.  Patient changed to inpatient- requiring IV antibiotics and IV solumedrol.

## 2013-07-02 DIAGNOSIS — I5033 Acute on chronic diastolic (congestive) heart failure: Principal | ICD-10-CM

## 2013-07-02 DIAGNOSIS — B9689 Other specified bacterial agents as the cause of diseases classified elsewhere: Secondary | ICD-10-CM

## 2013-07-02 DIAGNOSIS — R7881 Bacteremia: Secondary | ICD-10-CM

## 2013-07-02 DIAGNOSIS — I509 Heart failure, unspecified: Secondary | ICD-10-CM

## 2013-07-02 DIAGNOSIS — J9620 Acute and chronic respiratory failure, unspecified whether with hypoxia or hypercapnia: Secondary | ICD-10-CM

## 2013-07-02 LAB — BASIC METABOLIC PANEL
BUN: 16 mg/dL (ref 6–23)
CO2: 43 meq/L — AB (ref 19–32)
CREATININE: 0.71 mg/dL (ref 0.50–1.10)
Calcium: 9.4 mg/dL (ref 8.4–10.5)
Chloride: 88 mEq/L — ABNORMAL LOW (ref 96–112)
GFR calc Af Amer: >90 mL/min (ref >90)
GFR calc non Af Amer: >90 mL/min (ref >90)
Glucose, Bld: 131 mg/dL — ABNORMAL HIGH (ref 70–99)
Potassium: 3.9 mEq/L (ref 3.7–5.3)
Sodium: 140 mEq/L (ref 137–147)

## 2013-07-02 LAB — INFLUENZA PANEL BY PCR (TYPE A & B)
H1N1FLUPCR: NOT DETECTED
INFLBPCR: NEGATIVE
Influenza A By PCR: NEGATIVE

## 2013-07-02 LAB — HIV ANTIBODY (ROUTINE TESTING W REFLEX): HIV: NONREACTIVE

## 2013-07-02 MED ORDER — BUDESONIDE-FORMOTEROL FUMARATE 160-4.5 MCG/ACT IN AERO
2.0000 | INHALATION_SPRAY | Freq: Two times a day (BID) | RESPIRATORY_TRACT | Status: DC
  Administered 2013-07-02 – 2013-07-03 (×2): 2 via RESPIRATORY_TRACT
  Filled 2013-07-02: qty 6

## 2013-07-02 MED ORDER — VANCOMYCIN HCL 10 G IV SOLR
2000.0000 mg | Freq: Once | INTRAVENOUS | Status: AC
  Administered 2013-07-02: 2000 mg via INTRAVENOUS
  Filled 2013-07-02 (×2): qty 2000

## 2013-07-02 NOTE — Progress Notes (Signed)
Subjective: Patient seen and examined at the bedside this morning. She feels at baseline in terms of her SOB. Her only complaint is dizziness which is a chronic issue but perhaps worse over the past few days. She is alert, oriented, and not somnolent today. She does not like to use the BiPAP.   Objective: Vital signs in last 24 hours: Filed Vitals:   07/02/13 0828 07/02/13 0830 07/02/13 1155 07/02/13 1210  BP:  143/87    Pulse:  88 82   Temp:  98.3 F (36.8 C) 98.2 F (36.8 C)   TempSrc:  Oral Oral   Resp:  14 20   Height:      Weight:      SpO2: 97% 92% 98% 99%   Weight change: -9 lb 14.7 oz (-4.5 kg)  Intake/Output Summary (Last 24 hours) at 07/02/13 1348 Last data filed at 07/02/13 1300  Gross per 24 hour  Intake    920 ml  Output   2875 ml  Net  -1955 ml   General: Vital signs reviewed. Patient is an obese female, in no acute distress and cooperative with exam. Less lethargic today. Head: Normocephalic and atraumatic.  Eyes: PERRL, EOMI, conjunctivae normal, No scleral icterus. Xanthelasma.  Neck: Supple, trachea midline, normal ROM, No JVD, masses, thyromegaly, or carotid bruit present.  Cardiovascular: RRR, S1 normal, S2 normal, no murmurs, gallops, or rubs.  Pulmonary/Chest: Air entry equal bilaterally, no wheezes. Bibasilar crackles, improved. Abdominal: Soft, non-tender, non-distended, BS +, no masses, organomegaly, or guarding present.  Musculoskeletal: No joint deformities, erythema, or stiffness, ROM full and nontender.  Extremities: No swelling or edema, pulses symmetric and intact bilaterally. No cyanosis or clubbing. Left ankle tenderness to palpation, full range of motion.  Neurological: A&O x3, Strength is normal and symmetric bilaterally, cranial nerve II-XII are grossly intact, no focal motor deficit, sensory intact to light touch bilaterally.  Skin: Warm, dry and intact. No rashes or erythema.  Psychiatric: Normal mood and affect. speech and behavior is  normal. Cognition and memory are normal.   Lab Results: Basic Metabolic Panel:  Recent Labs Lab 07/01/13 0500 07/02/13 0324  NA 141 140  K 4.6 3.9  CL 92* 88*  CO2 39* 43*  GLUCOSE 119* 131*  BUN 9 16  CREATININE 0.60 0.71  CALCIUM 9.6 9.4  MG 1.7  --    Liver Function Tests:  Recent Labs Lab 07/01/13 0500  AST 13  ALT 14  ALKPHOS 106  BILITOT 0.6  PROT 6.6  ALBUMIN 3.3*   No results found for this basename: LIPASE, AMYLASE,  in the last 168 hours No results found for this basename: AMMONIA,  in the last 168 hours CBC:  Recent Labs Lab 06/30/13 2008 07/01/13 0500  WBC 5.9 7.4  NEUTROABS 4.6  --   HGB 13.4 13.3  HCT 43.9 43.1  MCV 98.2 97.3  PLT 150 141*   Cardiac Enzymes:  Recent Labs Lab 06/30/13 2008 07/01/13 0500  TROPONINI <0.30 <0.30   BNP: No results found for this basename: PROBNP,  in the last 168 hours D-Dimer: No results found for this basename: DDIMER,  in the last 168 hours CBG:  Recent Labs Lab 07/01/13 0609  GLUCAP 122*   Hemoglobin A1C: No results found for this basename: HGBA1C,  in the last 168 hours Fasting Lipid Panel: No results found for this basename: CHOL, HDL, LDLCALC, TRIG, CHOLHDL, LDLDIRECT,  in the last 168 hours Thyroid Function Tests: No results found for this  basename: TSH, T4TOTAL, FREET4, T3FREE, THYROIDAB,  in the last 168 hours Coagulation: No results found for this basename: LABPROT, INR,  in the last 168 hours Anemia Panel: No results found for this basename: VITAMINB12, FOLATE, FERRITIN, TIBC, IRON, RETICCTPCT,  in the last 168 hours Urine Drug Screen: Drugs of Abuse     Component Value Date/Time   LABOPIA NONE DETECTED 07/01/2013 1218   [place] 02/04/2009 [place] 07/01/2013 1218   [place] 02/04/2009 [place] DETECTED 07/01/2013 [place] 02/04/2009 2315   AMPHETMU NONE DETECTED 07/01/2013 1218   [place] 02/04/2009 2315    THCU NONE DETECTED 07/01/2013 1218   LABBARB NONE DETECTED 07/01/2013 1218    Alcohol Level: No results found for this basename: ETH,  in the last 168 hours Urinalysis: No results found for this basename: COLORURINE, APPERANCEUR, LABSPEC, PHURINE, GLUCOSEU, HGBUR, BILIRUBINUR, KETONESUR, PROTEINUR, UROBILINOGEN, NITRITE, LEUKOCYTESUR,  in the last 168 hours Misc. Labs:  Micro Results: Recent Results (from the past 240 hour(s))  CULTURE, BLOOD (ROUTINE X 2)     Status: None   Collection Time    06/30/13  9:40 PM      Result Value Ref Range Status   Specimen Description BLOOD BLOOD LEFT FOREARM   Final   Special Requests BOTTLES DRAWN AEROBIC ONLY 5CC   Final   Culture  Setup Time     Final   Value: 07/01/2013 01:36     Performed at [facility]   Culture     Final   Value: STAPHYLOCOCCUS SPECIES (COAGULASE NEGATIVE)     Note: RIFAMPIN AND GENTAMICIN SHOULD NOT BE USED AS SINGLE DRUGS FOR TREATMENT OF STAPH INFECTIONS.     Note: Gram Stain Report Called to,Read Back By and Verified With: HEATHER RICHARD ON 07/01/2013 AT 8:48P BY WILEJ     Performed at [facility]   Report Status PENDING   Incomplete  CULTURE, BLOOD (ROUTINE X 2)     Status: None   Collection Time    06/30/13  9:45 PM      Result Value Ref Range Status   Specimen Description BLOOD RIGHT ANTECUBITAL   Final   Special Requests BOTTLES DRAWN AEROBIC AND ANAEROBIC 5CC   Final   Culture  Setup Time     Final   Value: 07/01/2013 01:36     Performed at [facility]   Culture     Final   Value: STAPHYLOCOCCUS SPECIES (COAGULASE NEGATIVE)     Note: Gram Stain Report Called to,Read Back By and Verified With: HEATHER RICHARD ON 07/01/2013 AT 8:48P BY WILEJ     Performed at [facility]   Report Status PENDING   Incomplete  MRSA PCR SCREENING     Status: None   Collection Time    07/01/13  2:08 AM      Result Value Ref Range Status   MRSA by PCR NEGATIVE  NEGATIVE Final   Comment:              The GeneXpert MRSA Assay (FDA     approved for NASAL specimens     only), is one component of a     comprehensive MRSA colonization     surveillance program. It is not     intended to diagnose MRSA     infection nor to guide or     monitor treatment for     MRSA infections.  Studies/Results: Dg Chest 2 View  07/01/2013   CLINICAL DATA:  COPD, sleep apnea  EXAM: CHEST  2 VIEW  COMPARISON:  06/30/2013  FINDINGS: Cardiomediastinal silhouette is stable. Persistent patchy right basilar atelectasis or infiltrate. No pulmonary edema.  IMPRESSION: Cardiomegaly. Persistent patchy right basilar atelectasis or infiltrate. No pulmonary edema.   Electronically Signed   By: Lahoma Crocker M.D.   On: 07/01/2013 10:22   Dg Chest 2 View  06/30/2013   CLINICAL DATA:  Short of breath.  EXAM: CHEST  2 VIEW  COMPARISON:  05/14/2013  FINDINGS: Cardiac silhouette is mildly enlarged. Normal mediastinal contours. Stable prominent pulmonary arteries.  Lungs are hyperexpanded. There is increased opacity at the right lung base when compared to the prior exam. This silhouettes the portion of the right lateral posterior hemidiaphragm.  There are prominent bronchovascular markings similar to the prior exam as well as mild bilateral interstitial thickening.  No pleural effusion or pneumothorax.  The bony thorax is demineralized but intact.  IMPRESSION: 1. New right lower lobe opacity. This suggests pneumonia in the proper clinical setting. It could be due to atelectasis. 2. Lung hyperexpansion with prominent bronchovascular and mildly thickened interstitial markings. These findings are stable from the prior study. 3. Mild stable cardiomegaly.   Electronically Signed   By: Lajean Manes M.D.   On: 06/30/2013 20:35   Dg Ankle Complete Left  07/01/2013   CLINICAL DATA:  Fall, left ankle pain and swelling  EXAM: LEFT ANKLE COMPLETE - 3+ VIEW  COMPARISON:  10/24/2011  FINDINGS: Three views of left ankle submitted. No acute fracture  or subluxation. Ankle mortise is preserved. There is plantar and posterior spurring of calcaneus. Again noted old fracture or distal left fibula.  IMPRESSION: No acute fracture or subluxation. Plantar and posterior spurring of calcaneus. Old fracture of distal fibula.   Electronically Signed   By: Lahoma Crocker M.D.   On: 07/01/2013 09:50   Ct Head Wo Contrast  06/30/2013   CLINICAL DATA:  Dizzy.  Near Fall.  EXAM: CT HEAD WITHOUT CONTRAST  TECHNIQUE: Contiguous axial images were obtained from the base of the skull through the vertex without intravenous contrast.  COMPARISON:  None.  FINDINGS: Ventricles are normal in size and configuration. There are no parenchymal masses or mass effect. There is no evidence of an infarct. Minimal white matter hypoattenuation is noted consistent with chronic microvascular ischemic change.  No extra-axial masses or abnormal fluid collections.  There is no intracranial hemorrhage.  Visualized sinuses and mastoid air cells are clear.  IMPRESSION: 1. No acute intracranial abnormalities. 2. Minimal chronic microvascular ischemic change. 3. No other abnormalities.   Electronically Signed   By: Lajean Manes M.D.   On: 06/30/2013 20:44   Dg Knee Complete 4 Views Left  06/30/2013   CLINICAL DATA:  Fall, bilateral knee pain  EXAM: LEFT KNEE - COMPLETE 4+ VIEW  COMPARISON:  Concurrently obtained radiographs of the contralateral right knee  FINDINGS: There is no evidence of fracture, dislocation, or joint effusion. There is no evidence of arthropathy or other focal bone abnormality. Incidental note is made of a fabella. Minimal tricompartmental degenerative changes. Soft tissues are unremarkable. Trace atherosclerotic calcification along the course of the superficial femoral artery.  IMPRESSION: 1. No acute fracture, malalignment or joint effusion. 2. Minimal tricompartmental degenerative changes. 3. Trace atherosclerotic calcification along the course of the superficial femoral artery.    Electronically Signed   By: Jacqulynn Cadet M.D.   On: 06/30/2013 20:39  Dg Knee Complete 4 Views Right  06/30/2013   CLINICAL DATA:  Fall, bilateral knee pain  EXAM: RIGHT KNEE - COMPLETE 4+ VIEW  COMPARISON:  Concurrently obtained radiographs of the contralateral left knee. Prior right knee MRI 03/04/2006  FINDINGS: There is no evidence of fracture, dislocation, or joint effusion. There is no evidence of arthropathy or other focal bone abnormality. Minimal tricompartmental degenerative change. Incidental note is made of a fabella. Soft tissues are unremarkable.  IMPRESSION: Negative.  Minimal tricompartmental degenerative changes.   Electronically Signed   By: Jacqulynn Cadet M.D.   On: 06/30/2013 20:40   Medications: I have reviewed the patient's current medications. Scheduled Meds: . aspirin  81 mg Oral Daily  . furosemide  40 mg Oral Daily  . heparin  5,000 Units Subcutaneous 3 times per day  . ipratropium-albuterol  3 mL Nebulization QID  . methylPREDNISolone (SOLU-MEDROL) injection  60 mg Intravenous Q12H  . simvastatin  10 mg Oral q1800  . sodium chloride  3 mL Intravenous Q12H  . sodium chloride  3 mL Intravenous Q12H  . vancomycin  1,000 mg Intravenous Q12H   Continuous Infusions:  PRN Meds:.sodium chloride, albuterol, sodium chloride  Assessment/Plan: Ms. Anorah Trias is a 58 y.o. female w/ PMHx of HTN, HLD, COPD (on 4L home O2), CAD (DES to LAD, 2010), dCHF, PAH, OSA, and GERD s/p Nissen Fundoplication, admitted for suspected COPD exacerbation.   #Chronic Respiratory Failure 2/2 COPD - Presentation/ABG initially concerning for acute on chronic hypercarbic respiratory failure, however this is more likely her baseline, poorly-controlled Gold IV COPD (baseline FEV1 0.88 L , 37% pred 2010). UDS negative for illicits. Her SOB is at baseline. Denies cough. No fever, chills, lung exam only with mild bibasilar crackles today. Do not feel this is a COPD exacerbation or  pneumonia. - Appreciate PCCM recs, they have signed off - BiPAP prn and QHS (but patient is not complying) - O2 to keep sats 88-92% (do not want to suppress respiratory drive) - Stop steroids - Antibiotics > Vancomycin IV, as below - Symbicort 2 puffs BID - Duonebs q6h prn  - Restart Spiriva on discahrge - Flutter valve - Consult to social work given transportation needs - Needs outpatient sleep study and BIPAP titration study - Needs outpatient PFT - Educated at length to quit smoking - BMP, CBC in am  ABG    Component Value Date/Time   PHART 7.360 07/01/2013 1200   PCO2ART 89.4* 07/01/2013 1200   PO2ART 58.5* 07/01/2013 1200   HCO3 49.2* 07/01/2013 1200   TCO2 52.0 07/01/2013 1200   O2SAT 90.5 07/01/2013 1200    #Coagulase negative staph bacteremia - Blood cultures from 3/29 are positive x2 for STAPHYLOCOCCUS SPECIES (COAGULASE NEGATIVE). Clinically she is not bacteremic - no fever, AVSS, at her baseline state of health. Per Dr. Linus Salmons, this can be associatied with cardiac device (pacemaker) or vascular device but the patient does not have either, only cardiac stent. No other foreign material such as prosthetic joints, no lines. Therefore no indication for further evaluation. A short course of antibioitcs is sufficient as long as repeat blood cultures remain negative. No echocardiogram indicated at this time.  - Appreciate ID recs - Continue vancomycin for (day 2/7) - can transition to po linezolid to complete course if discharged tomorrow - Social work consult for [facility] letter - Follow up repeat blood cultures, drawn 3/31  #dCHF - Presented with volume overload 2/2 lasix noncompliance, we are diuresing her with her home  dose Lasix. 2D echo on 11/07/12 showed EF of 70-01%, normal systolic function. Paradoxical septal motion, pulmonary artery pressure of 64 mm Hg (see below). Troponins have been negative. Repeat CXR was without pulmonary edema. - Continue home Lasix 40 mg po qd  - Daily  weights > 244 > 235 - I/Os > UOP 3775 yesterday, net -[id number] since admission - Educated on importance of lasix adherence  #Fall at [place] feel her chronic hypoxia/COPD/Lasix noncompliance is contributing. Bilateral knee XR show mild degenerative changes, otherwise completely normal. CT head w/out acute findings. Still w/ left ankle pain but ROM intact and XR shows no acute fracture or subluxation. - Ibuprofen prn pain  - Ice pack for left ankle   #HTN - Normotensive. - Lasix as above  #CAD - Previous cardiac catheterization on 11/05/12 showed moderate PAH, likely d/t COPD + OSA. In 12/2008, patient had DES to proximal LAD w/ total RCA occlusion. On ASA 81 + Lovastatin 20 mg qhs.  - Continue ASA + Zocor   #HLD- Patient on Lovastatin as above. Most recent lipid panel (11/09/12) shows total cholesterol of 237, triglycerides of 356, HDL of 30, LDL of 136. Xanthelasmas on exam.  - Continue Zocor   #OSA - Does not use CPAP at home. No previous sleep study on record.  - Patient will likely need sleep study as an outpatient if we can get her transportation needs worked out  #Pulmonary HTN- Previous ECHO from 11/07/12 shows PA pressure of 64 mm Hg. Likely contributing to respiratory status at this time.  - O2 as above  - Lasix as above  #GERD - S/p Nissen Fundoplication. Not on any medications at home. Stable at this time.   #DVT/PE PPx - Heparin [place]   Dispo: Disposition is deferred at this time, awaiting improvement of current medical problems.  Anticipated discharge in approximately 1-3 day(s).   The patient does have a current PCP Jessee Avers, MD) and does need an [facility] hospital follow-up appointment after discharge.  The patient does not have transportation limitations that hinder transportation to clinic appointments.  .Services Needed at time of discharge: Y = Yes, Blank = No PT: Home health PT;Supervision/Assistance - 24 hour  OT:   RN:   Equipment:   Other:     LOS: 2 days    Lesly Dukes, MD 07/02/2013, 1:48 PM  Lesly Dukes, MD  [email or web address] Pager # [phone] Office # [phone]

## 2013-07-02 NOTE — Progress Notes (Signed)
  Date: 07/02/2013  Patient name: [name]  Medical record number: [id number]  Date of birth: 08/01/1955   This patient has been seen and the plan of care was discussed with the house staff. Please see their note for complete details. I concur with their findings with the following additions/corrections:  Overnight, surprising results of CoNS bacteremia 2/2 BC's. Would repeat BC today. She was started on Vanc and nafcillin overnight. Would watch another day to assure that repeat [facility] are negative. Clinically, she is improved. I agree that she does not have an exacerbation of COPD given overall clinical picture, she does have advanced COPD. Appreciate Pulm input. She will need to stop TOB use and needs outpatient sleep study.  Dominic Pea, DO, [facility] 07/02/2013, 1:48 PM

## 2013-07-02 NOTE — Progress Notes (Signed)
[facility] PCCM  S: Feels better  O:  Filed Vitals:   07/02/13 0446 07/02/13 0700 07/02/13 0828 07/02/13 0830  BP: 136/88   143/87  Pulse: 69   88  Temp: 98.3 F (36.8 C) 98.8 F (37.1 C)  98.3 F (36.8 C)  TempSrc: Axillary Oral  Oral  Resp: 15   14  Height:      Weight: 106.6 kg (235 lb 0.2 oz)     SpO2: 95%  97% 92%   4L [place]  Gen: chronically ill appearing, no acute distress HEENT: NCAT, EOMi, OP clear PULM: Few wheezes bilaterally, few crackles in bases CV: RRR, systolic murmur noted, no JVD AB: BS+, soft, nontender, no hsm Ext: warm, trace pretibial edema, no clubbing, no cyanosis  Impression: 1) Acute exacerbation of diastolic CHF > improving 2) Severe COPD (baseline FEV1 0.88 L , 37% pred 2010), still smoking; not in exacerbation 3) WHO III Pulmonary hypertension > no indication for pulmonary vasodilator 4) Chronic resting hypercarbia > from COPD and 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

## 2013-07-02 NOTE — Consult Note (Addendum)
[place] for Infectious Disease     Reason for Consult: bacteremia    Referring Physician: Dr. Murlean Caller  Principal Problem:   Acute-on-chronic respiratory failure Active Problems:   OBESITY   TOBACCO ABUSE   PULMONARY HYPERTENSION, SECONDARY   CHRONIC OBSTRUCTIVE PULMONARY DISEASE   SLEEP APNEA   PREDIABETES   Chronic respiratory failure   Acute on chronic diastolic heart failure   Fall   Left ankle pain   Left knee pain   Acute on chronic heart failure   Acute on chronic diastolic CHF (congestive heart failure), NYHA class 1   Acute on chronic respiratory failure   COPD exacerbation   Obstructive sleep apnea   Pulmonary hypertension   . aspirin  81 mg Oral Daily  . furosemide  40 mg Oral Daily  . heparin  5,000 Units Subcutaneous 3 times per day  . ipratropium-albuterol  3 mL Nebulization QID  . methylPREDNISolone (SOLU-MEDROL) injection  60 mg Intravenous Q12H  . nafcillin IV  2 g Intravenous 6 times per day  . simvastatin  10 mg Oral q1800  . sodium chloride  3 mL Intravenous Q12H  . sodium chloride  3 mL Intravenous Q12H  . vancomycin  2,000 mg Intravenous Once  . vancomycin  1,000 mg Intravenous Q12H    Recommendations: Continue vancomycin for 5-7 days (now day 2) - can use po linezolid to complete course if discharged soon Will repeat blood cultures I will d/c nafcillin  Assessment: She has CoNS bacteremia with 2/2. This can be associatied with cardiac device (pacemaker) or vascular device but the patient does not have either, only cardiac stent.  No other foreign material such as prosthetic joints, no lines.  Therefore no indication for further evaluation.  A short course of antibioitcs is sufficient as long as repeat blood cultures remain negative. No echocardiogram indicated.    Thanks for consult, call with questions  Antibiotics: Vancomycin day 2  HPI: [name] is a 58 y.o. female with COPD on chronic O2 at home, persistent smoker, CAD  with history of PCI who came in to ED after a fall at home.  She does not remember much of it.  She was brought in by EMS.  She was hypoxic and new infiltrate on CXR noted.  Also some ankle pain after twisting it but now resolved.  Blood cultures done for pneumonia work up grew 2/2 CoNS.  No fever, no chills.     Review of Systems: A comprehensive review of systems was negative.  Past Medical History  Diagnosis Date  . Coronary artery disease     S/P PCI of LAD with DES (12/2008). Total occlusion of RCA noted at that time., medically managed. ACS ruled out 03/2009 with Lexiscan myoview . Followed by [place].  . Pulmonary hypertension     2-D Echo (54/4920) - Systolic pressure was moderately increased. PA peak pressure  25mHg. 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 Followed by Dr. WJoya Gaskins(Velora Heckler  . Diastolic dysfunction     2-D Echo (12/2008) - Normal LV Systolic funciton with EF 60-65%. Grade 1 diastolid dysfunction. No regional wall motion abnormalities. Moderate pulmonary HTN with PA peak pressure 523mg.  . Marland KitchenOPD (chronic obstructive pulmonary disease)     Severe. Gold Stage IV.  PFTs (12/2008) - severe obstructive airway disease. Active tobacco use. Requires 4L O2 at home.  . Pulmonary nodule,  right     Small right middle lobe nodule. Stable as of 12/2008.  Marland Kitchen Sleep apnea     Presumptive. No documented sleep studies.   . Prediabetes 12/2008    HgbA1c 6.4 (12/2008)  . Hx MRSA infection     Recurrent MRSA thigh abscesses.  . Tobacco abuse     Ongoing.  . Obesity   . Hyperlipidemia   . GERD (gastroesophageal reflux disease)     S/P Nissen fundoplication.  . Shortness of breath   . CHF (congestive heart failure)     History  Substance Use Topics  . Smoking status: Current Every Day Smoker -- 0.50 packs/day for 40 years    Types: Cigarettes  . Smokeless tobacco: Never Used   . Alcohol Use: No    Family History  Problem Relation Age of Onset  . Heart disease Mother 103    Deceased from MI at 34yo  . Hypertension Mother   . Heart disease Father 67    Deceased of MI age 46yo  . Hypertension Father   . Hypertension Brother   . Lung cancer      Grandmother   Allergies  Allergen Reactions  . Fluconazole Anaphylaxis    DIFLUCAN    OBJECTIVE: Blood pressure 143/87, pulse 82, temperature 98.2 F (36.8 C), temperature source Oral, resp. rate 20, height _0  (1.651 m), weight 235 lb 0.2 oz (106.6 kg), SpO2 99.00%. General: awake, alert, nad Skin: no rashes Lungs: bibasilar crackles, fine Cor: RRR without m Abdomen: soft, nt, nd, Ext: no edema, no ankle or knee effusion  Microbiology: Recent Results (from the past 240 hour(s))  CULTURE, BLOOD (ROUTINE X 2)     Status: None   Collection Time    06/30/13  9:40 PM      Result Value Ref Range Status   Specimen Description BLOOD BLOOD LEFT FOREARM   Final   Special Requests BOTTLES DRAWN AEROBIC ONLY 5CC   Final   Culture  Setup Time     Final   Value: 07/01/2013 01:36     Performed at [facility]   Culture     Final   Value: STAPHYLOCOCCUS SPECIES (COAGULASE NEGATIVE)     Note: RIFAMPIN AND GENTAMICIN SHOULD NOT BE USED AS SINGLE DRUGS FOR TREATMENT OF STAPH INFECTIONS.     Note: Gram Stain Report Called to,Read Back By and Verified With: HEATHER RICHARD ON 07/01/2013 AT 8:48P BY WILEJ     Performed at [facility]   Report Status PENDING   Incomplete  CULTURE, BLOOD (ROUTINE X 2)     Status: None   Collection Time    06/30/13  9:45 PM      Result Value Ref Range Status   Specimen Description BLOOD RIGHT ANTECUBITAL   Final   Special Requests BOTTLES DRAWN AEROBIC AND ANAEROBIC 5CC   Final   Culture  Setup Time     Final   Value: 07/01/2013 01:36     Performed at [facility]   Culture     Final   Value: STAPHYLOCOCCUS SPECIES (COAGULASE NEGATIVE)     Note: Gram Stain  Report Called to,Read Back By and Verified With: HEATHER RICHARD ON 07/01/2013 AT 8:48P BY WILEJ     Performed at [facility]   Report Status PENDING   Incomplete  MRSA PCR SCREENING     Status: None   Collection Time    07/01/13  2:08 AM  Result Value Ref Range Status   MRSA by PCR NEGATIVE  NEGATIVE Final   Comment:            The GeneXpert MRSA Assay (FDA     approved for NASAL specimens     only), is one component of a     comprehensive MRSA colonization     surveillance program. It is not     intended to diagnose MRSA     infection nor to guide or     monitor treatment for     MRSA infections.    Scharlene Gloss, [place] [facility] [place] [email or web address] [phone] pager  [phone] cell 07/02/2013, 1:07 PM

## 2013-07-02 NOTE — Progress Notes (Addendum)
Physical Therapy Treatment Patient Details Name: [name] MRN: [id number] DOB: May 24, 1955 Today's Date: 07/02/2013    History of Present Illness [name] is a 58 y.o. female w/ PMHx of HTN, HLD, COPD (on 4L home O2), CAD (DES to LAD, 2010), dCHF, PAH, OSA, and GERD s/p Nissen Fundoplication, presents to the [facility] ED w/ complaints of recent fall at home. On arrival to the ED, patient was noted to have SpO2 of 82%, but she denied any significant change from her baseline SOB as she has severe COPD and uses 4L O2.    PT Comments    Cause for patient's dizziness remains unknown. Pt not orthostatic during session and had negative peripheral vestibular assessments. She reports the dizziness feels like "just before you fall asleep when you feel so tired you can barely stand up." Denies vertigo at time of fall or while in hospital, but reports she has had spinning sensation at times. Today, she was dizzy with initial supine to sit and then again with sit to stand. SaO2 was >90% throughout (and most often >94%0 on 4L.   Follow Up Recommendations  Home health PT;Supervision for mobility/OOB     Equipment Recommendations  None recommended by PT    Recommendations for Other Services       Precautions / Restrictions Precautions Precautions: Fall Precaution Comments: On O2 at 4 l/min at home. Restrictions Weight Bearing Restrictions: No    Mobility  Bed Mobility Overal bed mobility: Modified Independent Bed Mobility: Supine to Sit     Supine to sit: Modified independent (Device/Increase time)     General bed mobility comments: uses momentum to come up to sit; reported dizziness, however remained steady in sitting  Transfers Overall transfer level: Needs assistance Equipment used: None Transfers: Sit to/from Stand Sit to Stand: Min guard         General transfer comment: x3; feels unsteady, no perceptible sway noticed; monitored standing BP on 2nd attempt with no  orthostasis  Ambulation/Gait Ambulation/Gait assistance: Min guard Ambulation Distance (Feet): 38 Feet Assistive device: None Gait Pattern/deviations: Step-through pattern;Wide base of support;Decreased stride length   Gait velocity interpretation: Below normal speed for age/gender General Gait Details: remained in room due to reports of dizziness/lightheadedness; seated rest x 1; no overt loss of balance, however pt feels off-balance   Stairs            Wheelchair Mobility    Modified Rankin (Stroke Patients Only)       Balance Overall balance assessment: Needs assistance Sitting-balance support: No upper extremity supported;Feet supported Sitting balance-Leahy Scale: Good     Standing balance support: No upper extremity supported Standing balance-Leahy Scale: Fair                      Cognition Arousal/Alertness: Lethargic;Awake/alert Behavior During Therapy: Flat affect Overall Cognitive Status: Within Functional Limits for tasks assessed                      Exercises      General Comments General comments (skin integrity, edema, etc.): Pt reports she sometimes has vertigo/"spinning" (not recently or at time of fall). Pt with smooth pursuits and normal saccades. Normal VOR without dizziness precipitated. Not orthostatic. Cause of dizziness unknown      Pertinent Vitals/Pain SaO2 95% on 4L at rest          90-96% on 4L with activity  BP sitting 105/61  Stand 117/71    Home Living             Home Layout: Multi-level        Prior Function            PT Goals (current goals can now be found in the care plan section) Acute Rehab PT Goals Patient Stated Goal: To feel better PT Goal Formulation: With patient Time For Goal Achievement: 07/08/13 Potential to Achieve Goals: Good Progress towards PT goals: Progressing toward goals    Frequency  Min 3X/week    PT Plan Current plan remains appropriate    End of  Session Equipment Utilized During Treatment: Gait belt;Oxygen Activity Tolerance: Patient limited by fatigue Patient left: with call bell/phone within reach;in chair     Time: 8472-0721 PT Time Calculation (min): 34 min  Charges:  $Gait Training: 8-22 mins $Therapeutic Activity: 8-22 mins                    G Codes:    Yessika Otte 2013-07-24, 3:18 PM Pager [phone]

## 2013-07-02 NOTE — Care Management Note (Signed)
    Page 1 of 2   07/03/2013     11:38:42 AM   CARE MANAGEMENT NOTE 07/03/2013  Patient:  TRESA, JOLLEY   Account Number:  [id number]  Date Initiated:  07/02/2013  Documentation initiated by:  GRAVES-BIGELOW,BRENDA  Subjective/Objective Assessment:   Pt admitted for Fall at home, hypoxia. Plan for d/c on zyvox.     Action/Plan:   CM did call [facility]- zyvox can be ordered, none in stock. CM did call [facility] on [address]- they have medication available. CM called [employer] and zyvox is a tier 5 med with co pay being $6.60 for 20 tablets no prior authorization.   Anticipated DC Date:  07/03/2013   Anticipated DC Plan:  [place]  CM consult  Medication Assistance      [facility] Choice  HOME HEALTH   Choice offered to / List presented to:  C-1 Patient        [place] arranged  [place] [facility]      [place].   Status of service:  Completed, signed off Medicare Important Message given?   (If response is "NO", the following Medicare IM given date fields will be blank) Date Medicare IM given:   Date Additional Medicare IM given:    Discharge Disposition:  [place]  Per UR Regulation:  Reviewed for med. necessity/level of care/duration of stay  If discussed at [place] of Stay Meetings, dates discussed:    Comments:  07/03/13- 70- Marvetta Gibbons RN, BSN [phone] Pt for d/c later today- order for [facility]- PT placed - in to speak with pt- per conversation- pt agreeable to [facility]- list of [place] agencies for [facility] provides- per pt choice- she would like to use [facility] for services- referral called to Butch Penny with [facility] for HH-PT-  pt also states that she recently changed to [employer] and has home 02 with [facility] - she is in the process of getting her home 02 changed to a company in [employer] that contracts with [employer]- she is working through her PCP for this, CSW to see pt for transportation needs-  information given to pt on the Zvvox coverage and which pharmacy has drug in stock.

## 2013-07-03 LAB — CULTURE, BLOOD (ROUTINE X 2)

## 2013-07-03 LAB — CBC WITH DIFFERENTIAL/PLATELET
BASOS ABS: 0 10*3/uL (ref 0.0–0.1)
Basophils Relative: 0 % (ref 0–1)
Eosinophils Absolute: 0.1 10*3/uL (ref 0.0–0.7)
Eosinophils Relative: 1 % (ref 0–5)
HCT: 41 % (ref 36.0–46.0)
Hemoglobin: 13 g/dL (ref 12.0–15.0)
LYMPHS ABS: 2.1 10*3/uL (ref 0.7–4.0)
LYMPHS PCT: 24 % (ref 12–46)
MCH: 30.1 pg (ref 26.0–34.0)
MCHC: 31.7 g/dL (ref 30.0–36.0)
MCV: 94.9 fL (ref 78.0–100.0)
Monocytes Absolute: 1 10*3/uL (ref 0.1–1.0)
Monocytes Relative: 12 % (ref 3–12)
NEUTROS ABS: 5.4 10*3/uL (ref 1.7–7.7)
Neutrophils Relative %: 63 % (ref 43–77)
PLATELETS: 147 10*3/uL — AB (ref 150–400)
RBC: 4.32 MIL/uL (ref 3.87–5.11)
RDW: 13.4 % (ref 11.5–15.5)
WBC: 8.6 10*3/uL (ref 4.0–10.5)

## 2013-07-03 LAB — BASIC METABOLIC PANEL
BUN: 19 mg/dL (ref 6–23)
CO2: 39 mEq/L — ABNORMAL HIGH (ref 19–32)
CREATININE: 0.71 mg/dL (ref 0.50–1.10)
Calcium: 9 mg/dL (ref 8.4–10.5)
Chloride: 92 mEq/L — ABNORMAL LOW (ref 96–112)
GFR calc non Af Amer: >90 mL/min (ref >90)
Glucose, Bld: 118 mg/dL — ABNORMAL HIGH (ref 70–99)
Potassium: 3.3 mEq/L — ABNORMAL LOW (ref 3.7–5.3)
SODIUM: 141 meq/L (ref 137–147)

## 2013-07-03 MED ORDER — BUDESONIDE-FORMOTEROL FUMARATE 160-4.5 MCG/ACT IN AERO: 2.0000 | INHALATION_SPRAY | Freq: Two times a day (BID) | RESPIRATORY_TRACT | Status: DC

## 2013-07-03 MED ORDER — DIPHENHYDRAMINE HCL 12.5 MG/5ML PO ELIX
12.5000 mg | ORAL_SOLUTION | Freq: Once | ORAL | Status: AC
  Administered 2013-07-03: 12.5 mg via ORAL
  Filled 2013-07-03: qty 5

## 2013-07-03 MED ORDER — LINEZOLID 600 MG PO TABS: 600.0000 mg | ORAL_TABLET | Freq: Two times a day (BID) | ORAL | Status: AC

## 2013-07-03 MED ORDER — POTASSIUM CHLORIDE CRYS ER 20 MEQ PO TBCR
40.0000 meq | EXTENDED_RELEASE_TABLET | Freq: Once | ORAL | Status: AC
  Administered 2013-07-03: 40 meq via ORAL
  Filled 2013-07-03: qty 2

## 2013-07-03 NOTE — Discharge Summary (Signed)
Name: [name] MRN: [id number] DOB: Aug 06, 1955 58 y.o. PCP: Jessee Avers, MD  Date of Admission: 06/30/2013  7:30 PM Date of Discharge: 07/03/2013 Attending Physician: Axel Filler, MD  Discharge Diagnosis: Principal Problem:   Acute-on-chronic respiratory failure Active Problems:   OBESITY   TOBACCO ABUSE   PULMONARY HYPERTENSION, SECONDARY   CHRONIC OBSTRUCTIVE PULMONARY DISEASE   SLEEP APNEA   PREDIABETES   Chronic respiratory failure   Acute on chronic diastolic heart failure   Fall   Left ankle pain   Left knee pain   Acute on chronic heart failure   Acute on chronic diastolic CHF (congestive heart failure), NYHA class 1   Acute on chronic respiratory failure   COPD exacerbation   Obstructive sleep apnea   Pulmonary hypertension  Discharge Medications:   Medication List         albuterol 1.25 MG/3ML nebulizer solution  Commonly known as:  ACCUNEB  Take 1 ampule by nebulization every 6 (six) hours as needed for wheezing.     albuterol 108 (90 BASE) MCG/ACT inhaler  Commonly known as:  PROVENTIL HFA;VENTOLIN HFA  Inhale 1 puff into the lungs every 4 (four) hours as needed for wheezing or shortness of breath.     aspirin 81 MG chewable tablet  Chew 1 tablet (81 mg total) by mouth daily.     budesonide-formoterol 160-4.5 MCG/ACT inhaler  Commonly known as:  SYMBICORT  Inhale 2 puffs into the lungs 2 (two) times daily.     furosemide 20 MG tablet  Commonly known as:  LASIX  Take 40 mg by mouth 2 (two) times daily.     ipratropium 0.02 % nebulizer solution  Commonly known as:  ATROVENT  Take 2.5 mLs (0.5 mg total) by nebulization every 4 (four) hours as needed for wheezing.     linezolid 600 MG tablet  Commonly known as:  ZYVOX  Take 1 tablet (600 mg total) by mouth 2 (two) times daily.     lovastatin 20 MG tablet  Commonly known as:  MEVACOR  Take 1 tablet (20 mg total) by mouth at bedtime.     tiotropium 18 MCG inhalation capsule    Commonly known as:  SPIRIVA HANDIHALER  Place 1 capsule (18 mcg total) into inhaler and inhale daily.        Disposition and follow-up:   [name] was discharged from [facility] in Stable condition.  At the hospital follow up visit please address:  1.  Shortness of breath. Smoking cessation. Medication compliance. Social needs - Was her oxygen supplier changed (old one did not contract with new insurance company)? Transportation needs (has she established with [facility])? Able to follow up with pulmonology (perhaps the most important thing for her)?   2.  Labs / imaging needed at time of follow-up: BMP, sleep study, pulmononary rehab  3.  Pending labs/ test needing follow-up: Blood cultures 3/31 (NGTD)  Follow-up Appointments:     Follow-up Information   Follow up with Cresenciano Genre, MD On 07/09/2013. (_0 :45am. This is your primary care clinic.)    Specialty:  Internal Medicine   Contact information:   [place] [address] [phone]       Follow up with Simonne Maffucci, MD On 07/10/2013. (_1 . This is your lung doctor.)    Specialty:  Pulmonary Disease   Contact information:   [place] [address] [place] [address] [phone]  Follow up with [facility]. (HH-PT)    Contact information:   [address] [place] [address] [phone]       Discharge Instructions: Discharge Orders   Future Appointments Provider Department Dept Phone   07/09/2013 8:45 AM Cresenciano Genre, MD [place] [phone]   07/10/2013 10:00 AM Juanito Doom, MD [facility] [phone]   08/07/2013 3:15 PM Jessee Avers, MD [place] [phone]   Future Orders Complete By Expires   Call MD for:  difficulty breathing, headache or visual disturbances  As directed    Call MD for:  persistant dizziness or light-headedness  As directed     Call MD for:  temperature >100.4  As directed    Diet - low sodium heart healthy  As directed    Increase activity slowly  As directed       Consultations:  PCCM  Procedures Performed:  Dg Chest 2 View  07/01/2013   CLINICAL DATA:  COPD, sleep apnea  EXAM: CHEST  2 VIEW  COMPARISON:  06/30/2013  FINDINGS: Cardiomediastinal silhouette is stable. Persistent patchy right basilar atelectasis or infiltrate. No pulmonary edema.  IMPRESSION: Cardiomegaly. Persistent patchy right basilar atelectasis or infiltrate. No pulmonary edema.   Electronically Signed   By: Lahoma Crocker M.D.   On: 07/01/2013 10:22   Dg Chest 2 View  06/30/2013   CLINICAL DATA:  Short of breath.  EXAM: CHEST  2 VIEW  COMPARISON:  05/14/2013  FINDINGS: Cardiac silhouette is mildly enlarged. Normal mediastinal contours. Stable prominent pulmonary arteries.  Lungs are hyperexpanded. There is increased opacity at the right lung base when compared to the prior exam. This silhouettes the portion of the right lateral posterior hemidiaphragm.  There are prominent bronchovascular markings similar to the prior exam as well as mild bilateral interstitial thickening.  No pleural effusion or pneumothorax.  The bony thorax is demineralized but intact.  IMPRESSION: 1. New right lower lobe opacity. This suggests pneumonia in the proper clinical setting. It could be due to atelectasis. 2. Lung hyperexpansion with prominent bronchovascular and mildly thickened interstitial markings. These findings are stable from the prior study. 3. Mild stable cardiomegaly.   Electronically Signed   By: Lajean Manes M.D.   On: 06/30/2013 20:35   Dg Ankle Complete Left  07/01/2013   CLINICAL DATA:  Fall, left ankle pain and swelling  EXAM: LEFT ANKLE COMPLETE - 3+ VIEW  COMPARISON:  10/24/2011  FINDINGS: Three views of left ankle submitted. No acute fracture or subluxation. Ankle mortise is preserved. There is plantar and posterior spurring of calcaneus. Again noted  old fracture or distal left fibula.  IMPRESSION: No acute fracture or subluxation. Plantar and posterior spurring of calcaneus. Old fracture of distal fibula.   Electronically Signed   By: Lahoma Crocker M.D.   On: 07/01/2013 09:50   Ct Head Wo Contrast  06/30/2013   CLINICAL DATA:  Dizzy.  Near Fall.  EXAM: CT HEAD WITHOUT CONTRAST  TECHNIQUE: Contiguous axial images were obtained from the base of the skull through the vertex without intravenous contrast.  COMPARISON:  None.  FINDINGS: Ventricles are normal in size and configuration. There are no parenchymal masses or mass effect. There is no evidence of an infarct. Minimal white matter hypoattenuation is noted consistent with chronic microvascular ischemic change.  No extra-axial masses or abnormal fluid collections.  There is no intracranial hemorrhage.  Visualized sinuses and mastoid air cells are  clear.  IMPRESSION: 1. No acute intracranial abnormalities. 2. Minimal chronic microvascular ischemic change. 3. No other abnormalities.   Electronically Signed   By: Lajean Manes M.D.   On: 06/30/2013 20:44   Dg Knee Complete 4 Views Left  06/30/2013   CLINICAL DATA:  Fall, bilateral knee pain  EXAM: LEFT KNEE - COMPLETE 4+ VIEW  COMPARISON:  Concurrently obtained radiographs of the contralateral right knee  FINDINGS: There is no evidence of fracture, dislocation, or joint effusion. There is no evidence of arthropathy or other focal bone abnormality. Incidental note is made of a fabella. Minimal tricompartmental degenerative changes. Soft tissues are unremarkable. Trace atherosclerotic calcification along the course of the superficial femoral artery.  IMPRESSION: 1. No acute fracture, malalignment or joint effusion. 2. Minimal tricompartmental degenerative changes. 3. Trace atherosclerotic calcification along the course of the superficial femoral artery.   Electronically Signed   By: Jacqulynn Cadet M.D.   On: 06/30/2013 20:39   Dg Knee Complete 4 Views  Right  06/30/2013   CLINICAL DATA:  Fall, bilateral knee pain  EXAM: RIGHT KNEE - COMPLETE 4+ VIEW  COMPARISON:  Concurrently obtained radiographs of the contralateral left knee. Prior right knee MRI 03/04/2006  FINDINGS: There is no evidence of fracture, dislocation, or joint effusion. There is no evidence of arthropathy or other focal bone abnormality. Minimal tricompartmental degenerative change. Incidental note is made of a fabella. Soft tissues are unremarkable.  IMPRESSION: Negative.  Minimal tricompartmental degenerative changes.   Electronically Signed   By: Jacqulynn Cadet M.D.   On: 06/30/2013 20:40    Admission HPI:  [name] is a 58 y.o. female w/ PMHx of HTN, HLD, COPD (on 4L home O2), CAD (DES to LAD, 2010), dCHF, PAH, OSA, and GERD s/p Nissen Fundoplication, presents to the [facility] ED w/ complaints of recent fall at home. The patient claims that she has not been feeling well for the past 2 days and has been in bed, saying that she has been very tired. She got up today to go the the bathroom and said that she felt unsteady on her feet for a short time and fell to the ground, backwards, hit her head and hurt her knees bilaterally. She went to the [facility] ED for her knee pain and headache. She denies LOC, change in vision, dizziness, lightheadedness, palpitations, or chest pain.  On arrival to the ED, patient was noted to have SpO2 of 82%, but she denied any significant change from her baseline SOB as she has severe COPD and uses 4L O2. The patient was placed on BiPAP w/ significant improvement in SpO2 and decrease in SOB.  She denies any recent sick contacts, acute cough, nausea, vomiting, abdominal pain, diarrhea, LE swelling, or significant weight change.  Physical Exam:  Filed Vitals:    06/30/13 1926  06/30/13 1940  06/30/13 2117   BP:   146/95  117/83   Pulse:  80  81  86   Temp:   98.4 F (36.9 C)    TempSrc:   Oral    Resp:  _0 Height:    _1  (1.651 m)   SpO2:   82%  81%  93%   General: Vital signs reviewed. Patient is an obese female, in no acute distress and cooperative with exam. Lethargic on exam.  Head: Normocephalic and atraumatic.  Eyes: PERRL, EOMI, conjunctivae normal, No scleral icterus. Xanthelasma.  Neck: Supple, trachea midline, normal ROM, No  JVD, masses, thyromegaly, or carotid bruit present.  Cardiovascular: RRR, S1 normal, S2 normal, no murmurs, gallops, or rubs.  Pulmonary/Chest: Air entry equal bilaterally, no wheezes. Mild bibasilar crackles.  Abdominal: Soft, non-tender, non-distended, BS +, no masses, organomegaly, or guarding present.  Musculoskeletal: No joint deformities, erythema, or stiffness, ROM full and nontender.  Extremities: No swelling or edema, pulses symmetric and intact bilaterally. No cyanosis or clubbing. Left ankle tenderness to palpation.  Neurological: A&O x3, Strength is normal and symmetric bilaterally, cranial nerve II-XII are grossly intact, no focal motor deficit, sensory intact to light touch bilaterally.  Skin: Warm, dry and intact. No rashes or erythema.  Psychiatric: Normal mood and affect. speech and behavior is normal. Cognition and memory are normal.    Hospital Course by problem list: [name] is a 58 y.o. female w/ PMHx of HTN, HLD, COPD (on 4L home O2), CAD (DES to LAD, 2010), dCHF, PAH, OSA, and GERD s/p Nissen Fundoplication, admitted for suspected COPD exacerbation.   1. Chronic respiratory failure 2/2 COPD - ABG (below) initially concerning for acute on chronic hypercarbic respiratory failure, however we - and PCCM in consultation - ultimately felt this was more consistent with her baseline, poorly-controlled Gold IV COPD (FEV1 0.88 L, 37% pred 2010) and likely obesity hypoventilation syndrome. Her SOB was at baseline on home 4L continuous O2 via [employer]. She denied cough. No fever or chills, and her lung exam was only with mild bibasilar crackles that improved with resumption of her home  Lasix (she had been noncompliant at home). AVSS. Afebrile. We offered BiPAP prn and QHS but she refused this. O2 was provided to keep sats 88-92% - we did not want to suppress respiratory drive. She was maintained on Symbicort 2 puffs BID and Duonebs QID. She was encouraged to use her flutter valve. Since we did not suspect COPD exacerbation or pneumonia, steroids and antibiotics were stopped (though we did start her back on vancomycin for CoNS bacteremia, see below). We restarted her home Spiriva on discharge. Social work talked to the patient about her transportation needs. Care management talked to her about her oxygen needs (her new insurance company does not contract with her old [employer], so she is in the process of being switched to a different O2 supplier).  She really needs to stop smoking and follow up with pulmonology for repeat PFTs and outpatient sleep study with CPAP titration. This was arranged upon discharge.  ABG    Component  Value  Date/Time    PHART  7.360  07/01/2013 1200    PCO2ART  89.4*  07/01/2013 1200    PO2ART  58.5*  07/01/2013 1200    HCO3  49.2*  07/01/2013 1200    TCO2  52.0  07/01/2013 1200    O2SAT  90.5  07/01/2013 1200    2. Coagulase negative staph bacteremia - Blood cultures from 3/29 were positive x2 for STAPHYLOCOCCUS SPECIES (COAGULASE NEGATIVE). Clinically she did not appear bacteremic - no fever, AVSS, at her baseline state of health. Per Dr. Linus Salmons, who saw her in consultation, CoNS bacteremia is typically associated with foreign devices (pacemaker, etc.), but the patient only has a cardiac DES stent. She has no other foreign material such as prosthetic joints. No central lines. Therefore, ID felt there was no indication for further evaluation with TEE. A short course of antibioitcs should be sufficient as long as repeat blood cultures remained negative. (Blood cultures drawn 3/31 are NGTD.)  She received Vancomycin  IV for 3 days and on discharge, we transitioned her  antibiotics to Linezolid 675m po BID as recommended by ID. Care management called her insurance company to confirm that Linezolid is a Tier 5 med with co-pay being $6.60 for 20 tablets with no need for prior authorization. I sent her prescription to [facility] as they had it in stock.  3. Acute on chronic dCHF - Patient presented with volume overload 2/2 Lasix noncompliance at home. She diuresed very well on her home dose Lasix 452mpo daily. Weight trended down from 244 to 235. I/Os were net -6152 since admission. The crackles heard on admission exam improved. Troponins were trended and negative. Repeat CXR was without pulmonary edema. Patient was educated on importance of Lasix adherence prior to discharge.  4. Mechanical fall at Home - Bilateral knee XR showed mild degenerative changes, otherwise completely normal. CT head was without acute findings. Left ankle XR showed no acute fracture or subluxation. We provided ibuprofen prn pain and an ice pack for her left ankle.  5. Dizziness - Patient complained of dizziness, going on for months but perhaps worse over the past few days. It felt like "just before you fall asleep when you feel so tired you can barely stand up." She denied vertigo at time of her fall, or while in hospital, though she does report a "spinning sensation at times". PT was consulted. She was not orthostatic during the session and had negative peripheral vestibular assessments. They noted she was dizzy with initial supine to sit, and then again with sit to stand. SaO2 was >90% throughout on 4L. We arranged for her to get home health PT and discussed fall precautions.  6. HTN - Normotensive here.  7. CAD - Previous cardiac catheterization on 11/05/12 showed moderate PAH, likely due to COPD + OSA. In 12/2008, patient had DES to proximal LAD w/ total RCA occlusion. On ASA 81 + Lovastatin 20 mg qhs.  8. HLD - Patient on Lovastatin as above. Most recent lipid panel (11/09/12) showed total  cholesterol of 237, triglycerides of 356, HDL of 30, LDL of 136. Xanthelasmas on exam.   9. OSA - Does not use CPAP at home. No previous sleep study on record. She refused CPAP here. Patient needs a sleep study as an outpatient.  10. Pulmonary HTN - Previous ECHO from 11/07/12 shows PA pressure of 64 mm Hg. WHO III Pulmonary hypertension > no indication for pulmonary vasodilator, per PCCM.  11. Hypokalemia - Trend below. We gave K-dur [id number]. Repeat as outpatient.   Potassium   Date  Value  Ref Range  Status   07/03/2013  3.3*  3.7 - 5.3 mEq/L  Final   07/02/2013  3.9  3.7 - 5.3 mEq/L  Final   07/01/2013  4.6  3.7 - 5.3 mEq/L  Final   06/30/2013  4.7  3.7 - 5.3 mEq/L  Final   05/13/2013  4.3  3.7 - 5.3 mEq/L  Final    12. GERD - S/p Nissen Fundoplication. Not on any medications at home. Stable at this time.    Discharge Vitals:   BP 104/69  Pulse 81  Temp(Src) 98 F (36.7 C) (Oral)  Resp 16  Ht _0  (1.651 m)  Wt 235 lb 7.2 oz (106.8 kg)  BMI 39.18 kg/m2  SpO2 94%  Discharge Labs:  Results for orders placed during the hospital encounter of 06/30/13 (from the past 24 hour(s))  BASIC METABOLIC PANEL     Status: Abnormal  Collection Time    07/03/13  3:05 AM      Result Value Ref Range   Sodium 141  137 - 147 mEq/L   Potassium 3.3 (*) 3.7 - 5.3 mEq/L   Chloride 92 (*) 96 - 112 mEq/L   CO2 39 (*) 19 - 32 mEq/L   Glucose, Bld 118 (*) 70 - 99 mg/dL   BUN 19  6 - 23 mg/dL   Creatinine, Ser 0.71  0.50 - 1.10 mg/dL   Calcium 9.0  8.4 - 10.5 mg/dL   GFR calc non Af Amer >90  >90 mL/min   GFR calc Af Amer >90  >90 mL/min  CBC WITH DIFFERENTIAL     Status: Abnormal   Collection Time    07/03/13  3:05 AM      Result Value Ref Range   WBC 8.6  4.0 - 10.5 K/uL   RBC 4.32  3.87 - 5.11 MIL/uL   Hemoglobin 13.0  12.0 - 15.0 g/dL   HCT 41.0  36.0 - 46.0 %   MCV 94.9  78.0 - 100.0 fL   MCH 30.1  26.0 - 34.0 pg   MCHC 31.7  30.0 - 36.0 g/dL   RDW 13.4  11.5 - 15.5 %   Platelets 147  (*) 150 - 400 K/uL   Neutrophils Relative % 63  43 - 77 %   Neutro Abs 5.4  1.7 - 7.7 K/uL   Lymphocytes Relative 24  12 - 46 %   Lymphs Abs 2.1  0.7 - 4.0 K/uL   Monocytes Relative 12  3 - 12 %   Monocytes Absolute 1.0  0.1 - 1.0 K/uL   Eosinophils Relative 1  0 - 5 %   Eosinophils Absolute 0.1  0.0 - 0.7 K/uL   Basophils Relative 0  0 - 1 %   Basophils Absolute 0.0  0.0 - 0.1 K/uL    Signed: Lesly Dukes, MD 07/03/2013, 1:41 PM   Time Spent on Discharge: 40 minutes Services Ordered on Discharge: [facility] PT Equipment Ordered on Discharge: None

## 2013-07-03 NOTE — Progress Notes (Signed)
[name] to be D/C'd Home per MD order.  Discussed with the patient and all questions fully answered.    Medication List         albuterol 1.25 MG/3ML nebulizer solution  Commonly known as:  ACCUNEB  Take 1 ampule by nebulization every 6 (six) hours as needed for wheezing.     albuterol 108 (90 BASE) MCG/ACT inhaler  Commonly known as:  PROVENTIL HFA;VENTOLIN HFA  Inhale 1 puff into the lungs every 4 (four) hours as needed for wheezing or shortness of breath.     aspirin 81 MG chewable tablet  Chew 1 tablet (81 mg total) by mouth daily.     budesonide-formoterol 160-4.5 MCG/ACT inhaler  Commonly known as:  SYMBICORT  Inhale 2 puffs into the lungs 2 (two) times daily.     furosemide 20 MG tablet  Commonly known as:  LASIX  Take 40 mg by mouth 2 (two) times daily.     ipratropium 0.02 % nebulizer solution  Commonly known as:  ATROVENT  Take 2.5 mLs (0.5 mg total) by nebulization every 4 (four) hours as needed for wheezing.     linezolid 600 MG tablet  Commonly known as:  ZYVOX  Take 1 tablet (600 mg total) by mouth 2 (two) times daily.     lovastatin 20 MG tablet  Commonly known as:  MEVACOR  Take 1 tablet (20 mg total) by mouth at bedtime.     tiotropium 18 MCG inhalation capsule  Commonly known as:  SPIRIVA HANDIHALER  Place 1 capsule (18 mcg total) into inhaler and inhale daily.        VVS, Skin clean, dry and intact without evidence of skin break down, no evidence of skin tears noted. IV catheter discontinued intact. Site without signs and symptoms of complications. Dressing and pressure applied.  An After Visit Summary was printed and given to the patient.  D/c education completed with patient/family including follow up instructions, medication list, d/c activities limitations if indicated, with other d/c instructions as indicated by MD - patient able to verbalize understanding, all questions fully answered.   Patient instructed to return to ED, call  [phone], or call MD for any changes in condition.   Patient escorted via [place], and D/C home via private auto.  Wonda Cerise D 07/03/2013 2:25 PM

## 2013-07-03 NOTE — Progress Notes (Signed)
Subjective: Patient seen and examined at the bedside this morning. She feels at her baseline. She denies shortness of breath, chest pain. She is amenable to going home today on the Linezolid. She needs to talk to Care Management about her oxygen needs and Social Work about her transportation needs prior to discharge. I spoke with both providers personally.  Objective: Vital signs in last 24 hours: Filed Vitals:   07/03/13 0328 07/03/13 0500 07/03/13 0749 07/03/13 0836  BP: 128/82  140/74   Pulse: 76  52   Temp: 97.7 F (36.5 C)  98.5 F (36.9 C)   TempSrc: Oral  Oral   Resp: 12  18   Height:      Weight:  235 lb 7.2 oz (106.8 kg)    SpO2: 96%  92% 99%   Weight change: 7.1 oz (0.2 kg)  Intake/Output Summary (Last 24 hours) at 07/03/13 0915 Last data filed at 07/03/13 0800  Gross per 24 hour  Intake    560 ml  Output   1850 ml  Net  -1290 ml   Physical Exam General: Vital signs reviewed. Patient is an obese female, in no acute distress and cooperative with exam. Alert, conversant, oriented. Head: Normocephalic and atraumatic.  Eyes: PERRL, EOMI, conjunctivae normal, No scleral icterus. Xanthelasma.  Neck: Supple, trachea midline, normal ROM, No JVD, masses, thyromegaly, or carotid bruit present.  Cardiovascular: RRR, S1 normal, S2 normal, no murmurs, gallops, or rubs.  Pulmonary/Chest: Air entry equal bilaterally, no wheezes. Fine bibasilar crackles, improved. Abdominal: Soft, non-tender, non-distended, BS +, no masses, organomegaly, or guarding present.  Musculoskeletal: No joint deformities, erythema, or stiffness, ROM full and nontender.  Extremities: No swelling or edema, pulses symmetric and intact bilaterally. No cyanosis or clubbing. Neurological: A&O x3, Strength is normal and symmetric bilaterally, cranial nerve II-XII are grossly intact, no focal motor deficit, sensory intact to light touch bilaterally.  Skin: Warm, dry and intact. No rashes or erythema.    Psychiatric: Normal mood and affect. speech and behavior is normal. Cognition and memory are normal.   Lab Results: Basic Metabolic Panel:  Recent Labs Lab 07/01/13 0500 07/02/13 0324 07/03/13 0305  NA 141 140 141  K 4.6 3.9 3.3*  CL 92* 88* 92*  CO2 39* 43* 39*  GLUCOSE 119* 131* 118*  BUN _0 CREATININE 0.60 0.71 0.71  CALCIUM 9.6 9.4 9.0  MG 1.7  --   --    Liver Function Tests:  Recent Labs Lab 07/01/13 0500  AST 13  ALT 14  ALKPHOS 106  BILITOT 0.6  PROT 6.6  ALBUMIN 3.3*   CBC:  Recent Labs Lab 06/30/13 2008 07/01/13 0500 07/03/13 0305  WBC 5.9 7.4 8.6  NEUTROABS 4.6  --  5.4  HGB 13.4 13.3 13.0  HCT 43.9 43.1 41.0  MCV 98.2 97.3 94.9  PLT 150 141* 147*   Cardiac Enzymes:  Recent Labs Lab 06/30/13 2008 07/01/13 0500  TROPONINI <0.30 <0.30   CBG:  Recent Labs Lab 07/01/13 0609  GLUCAP 122*   Urine Drug Screen: Drugs of Abuse     Component Value Date/Time   LABOPIA NONE DETECTED 07/01/2013 1218   LABOPIA NEGATIVE 02/04/2009 2315   COCAINSCRNUR NONE DETECTED 07/01/2013 1218   COCAINSCRNUR NEGATIVE 02/04/2009 2315   LABBENZ NONE DETECTED 07/01/2013 1218   LABBENZ NEGATIVE 02/04/2009 2315   AMPHETMU NONE DETECTED 07/01/2013 1218   AMPHETMU NEGATIVE 02/04/2009 2315   THCU NONE DETECTED 07/01/2013 1218   LABBARB NONE  DETECTED 07/01/2013 1218     Micro Results: Recent Results (from the past 240 hour(s))  CULTURE, BLOOD (ROUTINE X 2)     Status: None   Collection Time    06/30/13  9:40 PM      Result Value Ref Range Status   Specimen Description BLOOD BLOOD LEFT FOREARM   Final   Special Requests BOTTLES DRAWN AEROBIC ONLY 5CC   Final   Culture  Setup Time     Final   Value: 07/01/2013 01:36     Performed at [facility]   Culture     Final   Value: STAPHYLOCOCCUS SPECIES (COAGULASE NEGATIVE)     Note: RIFAMPIN AND GENTAMICIN SHOULD NOT BE USED AS SINGLE DRUGS FOR TREATMENT OF STAPH INFECTIONS.     Note: Gram Stain Report  Called to,Read Back By and Verified With: HEATHER RICHARD ON 07/01/2013 AT 8:48P BY WILEJ     Performed at [facility]   Report Status 07/03/2013 FINAL   Final   Organism ID, Bacteria STAPHYLOCOCCUS SPECIES (COAGULASE NEGATIVE)   Final  CULTURE, BLOOD (ROUTINE X 2)     Status: None   Collection Time    06/30/13  9:45 PM      Result Value Ref Range Status   Specimen Description BLOOD RIGHT ANTECUBITAL   Final   Special Requests BOTTLES DRAWN AEROBIC AND ANAEROBIC 5CC   Final   Culture  Setup Time     Final   Value: 07/01/2013 01:36     Performed at [facility]   Culture     Final   Value: STAPHYLOCOCCUS SPECIES (COAGULASE NEGATIVE)     Note: SUSCEPTIBILITIES PERFORMED ON PREVIOUS CULTURE WITHIN THE LAST 5 DAYS.     Note: Gram Stain Report Called to,Read Back By and Verified With: HEATHER RICHARD ON 07/01/2013 AT 8:48P BY Dennard Nip     Performed at [facility]   Report Status 07/03/2013 FINAL   Final  MRSA PCR SCREENING     Status: None   Collection Time    07/01/13  2:08 AM      Result Value Ref Range Status   MRSA by PCR NEGATIVE  NEGATIVE Final   Comment:            The GeneXpert MRSA Assay (FDA     approved for NASAL specimens     only), is one component of a     comprehensive MRSA colonization     surveillance program. It is not     intended to diagnose MRSA     infection nor to guide or     monitor treatment for     MRSA infections.  CULTURE, BLOOD (ROUTINE X 2)     Status: None   Collection Time    07/02/13  8:35 AM      Result Value Ref Range Status   Specimen Description BLOOD RIGHT ANTECUBITAL   Final   Special Requests BOTTLES DRAWN AEROBIC ONLY 10CC   Final   Culture  Setup Time     Final   Value: 07/02/2013 13:32     Performed at [facility]   Culture     Final   Value:        BLOOD CULTURE RECEIVED NO GROWTH TO DATE CULTURE WILL BE HELD FOR 5 DAYS BEFORE ISSUING A FINAL NEGATIVE REPORT     Performed at [facility]    Report Status PENDING   Incomplete  CULTURE, BLOOD (  ROUTINE X 2)     Status: None   Collection Time    07/02/13  8:40 AM      Result Value Ref Range Status   Specimen Description BLOOD LEFT ARM   Final   Special Requests BOTTLES DRAWN AEROBIC ONLY 10CC   Final   Culture  Setup Time     Final   Value: 07/02/2013 13:32     Performed at [facility]   Culture     Final   Value:        BLOOD CULTURE RECEIVED NO GROWTH TO DATE CULTURE WILL BE HELD FOR 5 DAYS BEFORE ISSUING A FINAL NEGATIVE REPORT     Performed at [facility]   Report Status PENDING   Incomplete   Studies/Results: Dg Chest 2 View  07/01/2013   CLINICAL DATA:  COPD, sleep apnea  EXAM: CHEST  2 VIEW  COMPARISON:  06/30/2013  FINDINGS: Cardiomediastinal silhouette is stable. Persistent patchy right basilar atelectasis or infiltrate. No pulmonary edema.  IMPRESSION: Cardiomegaly. Persistent patchy right basilar atelectasis or infiltrate. No pulmonary edema.   Electronically Signed   By: Lahoma Crocker M.D.   On: 07/01/2013 10:22   Dg Ankle Complete Left  07/01/2013   CLINICAL DATA:  Fall, left ankle pain and swelling  EXAM: LEFT ANKLE COMPLETE - 3+ VIEW  COMPARISON:  10/24/2011  FINDINGS: Three views of left ankle submitted. No acute fracture or subluxation. Ankle mortise is preserved. There is plantar and posterior spurring of calcaneus. Again noted old fracture or distal left fibula.  IMPRESSION: No acute fracture or subluxation. Plantar and posterior spurring of calcaneus. Old fracture of distal fibula.   Electronically Signed   By: Lahoma Crocker M.D.   On: 07/01/2013 09:50   Medications: I have reviewed the patient's current medications. Scheduled Meds: . aspirin  81 mg Oral Daily  . budesonide-formoterol  2 puff Inhalation BID  . furosemide  40 mg Oral Daily  . heparin  5,000 Units Subcutaneous 3 times per day  . ipratropium-albuterol  3 mL Nebulization QID  . simvastatin  10 mg Oral q1800  . sodium chloride  3 mL  Intravenous Q12H  . sodium chloride  3 mL Intravenous Q12H  . vancomycin  1,000 mg Intravenous Q12H   Continuous Infusions:  PRN Meds:.sodium chloride, albuterol, sodium chloride  Assessment/Plan: [name] is a 58 y.o. female w/ PMHx of HTN, HLD, COPD (on 4L home O2), CAD (DES to LAD, 2010), dCHF, PAH, OSA, and GERD s/p Nissen Fundoplication, admitted for suspected COPD exacerbation.   #Chronic respiratory failure 2/2 COPD - Presentation/ABG initially concerning for acute on chronic hypercarbic respiratory failure, however we feel this is more consistent with her baseline, poorly-controlled Gold IV COPD (FEV1 0.88 L, 37% pred 2010). Her SOB is at baseline today, improved with diuresis (see dCHF below). Denies cough. No fever, chills, lung exam only with mild bibasilar crackles today. Mental status is clear. AVSS. Afebrile. Clinically, do not feel this is a COPD exacerbation or pneumonia. She needs to stop smoking. - Appreciate PCCM recs, they have signed off, I will arrange outpatient follow up - BiPAP prn and QHS (but patient is not complying) - O2 to keep sats 88-92% (do not want to suppress respiratory drive) - Steroids have been discontinued - Antibiotics > Vancomycin IV, as below - Symbicort 2 puffs BID - Duonebs qid - Albuterol prn - Restart Spiriva on discharge - Flutter valve - Consult to social work given  transportation needs - Consult to care management for oxygen needs (her new insurance does not contract with [facility]) - Needs outpatient sleep study and BiPAP titration study - Needs outpatient repeat PFTs - Educated at length to quit smoking - Medically stable for discharge today  ABG    Component Value Date/Time   PHART 7.360 07/01/2013 1200   PCO2ART 89.4* 07/01/2013 1200   PO2ART 58.5* 07/01/2013 1200   HCO3 49.2* 07/01/2013 1200   TCO2 52.0 07/01/2013 1200   O2SAT 90.5 07/01/2013 1200    #Coagulase negative staph bacteremia - Blood cultures from 3/29 were  positive x2 for STAPHYLOCOCCUS SPECIES (COAGULASE NEGATIVE). Clinically she does not appear bacteremic - no fever, AVSS, at her baseline state of health. Per Dr. Linus Salmons, CoNS bacteremia is typically associated with foreign devices (pacemaker, etc.), but the patient only has a cardiac DES stent. No other foreign material such as prosthetic joints. No central lines. Therefore, no indication for further evaluation with TEE. A short course of antibioitcs should be sufficient as long as repeat blood cultures remain negative. (Blood cultures drawn 3/31 are NGTD.) - Appreciate ID recs - Vancomycin IV (day 3/7) - On discharge, will transition antibiotics to Linezolid 679m po BID - Care Management called her insurance company, Linezolid is a tier 5 med with co-pay being $6.60 for 20 tablets with no need for prior authorization, will send prescription to [facility] as they have it in stock  #Acute on chronic dCHF - Patient presented with volume overload 2/2 Lasix noncompliance, she is diuresing well on her home dose Lasix. 2D echo on 11/07/12 showed EF of 516-60% normal systolic function. Paradoxical septal motion, pulmonary artery pressure of 64 mm Hg (see below). Troponins trended and negative. Repeat CXR was without pulmonary edema. - Continue home Lasix 40 mg po qd  - Daily weights > 244 > 235 > 235 - I/Os > UOP 2125 yesterday, net -[id number] admission - Educated on importance of Lasix adherence  [place] is contributing. Bilateral knee XR showed mild degenerative changes, otherwise completely normal. CT head w/out acute findings. Left ankle XR showed no acute fracture or subluxation. - Ibuprofen prn pain  - Ice pack for left ankle   #Dizziness - Patient complaining of dizziness, going on for months but perhaps worse over the past few days. It feels like "just before you fall asleep when you feel so tired you can barely stand up." Denies vertigo  at time of her fall, or while in hospital, though she does report a "spinning sensation at times". PT was consulted. She was not orthostatic during the session and had negative peripheral vestibular assessments. They noted she was dizzy with initial supine to sit, and then again with sit to stand. SaO2 was >90% throughout on 4L. - Home health PT - Fall precautions  #HTN - Normotensive. - Lasix as above  #CAD - Previous cardiac catheterization on 11/05/12 showed moderate PAH, likely due to COPD + OSA. In 12/2008, patient had DES to proximal LAD w/ total RCA occlusion. On ASA 81 + Lovastatin 20 mg qhs.  - Continue ASA daily  #HLD - Patient on Lovastatin as above. Most recent lipid panel (11/09/12) showed total cholesterol of 237, triglycerides of 356, HDL of 30, LDL of 136. Xanthelasmas on exam.  - Continue Zocor   #OSA - Does not use CPAP at home. No previous sleep study on record.  - Patient will likely need sleep study as an  outpatient if we can get her transportation needs worked out  #Pulmonary HTN - Previous ECHO from 11/07/12 shows PA pressure of 64 mm Hg. Likely contributing to respiratory status at this time.  - O2 as above  - Lasix as above  #Hypokalemia - Trend below. - Gave K-dur 50mq x1 - Continue to monitor  Potassium  Date Value Ref Range Status  07/03/2013 3.3* 3.7 - 5.3 mEq/L Final  07/02/2013 3.9  3.7 - 5.3 mEq/L Final  07/01/2013 4.6  3.7 - 5.3 mEq/L Final  06/30/2013 4.7  3.7 - 5.3 mEq/L Final  05/13/2013 4.3  3.7 - 5.3 mEq/L Final    #GERD - S/p Nissen Fundoplication. Not on any medications at home. Stable at this time.   #DVT/PE PPx - Heparin subq   Dispo: Anticipated discharge in approximately 0-1 day(s).   The patient does have a current PCP (Jessee Avers MD) and [facility].  The patient does not have transportation limitations that hinder transportation to clinic appointments.  .Services Needed at time of  discharge: Y = Yes, Blank = No PT: Home health PT;Supervision for mobility/OOB  OT:   RN:   Equipment: None recommended by PT   Other:     LOS: 3 days   SLesly Dukes MD 07/03/2013, 9:15 AM  SLesly Dukes MD  [email or web address] Pager # [phone] # [phone]

## 2013-07-03 NOTE — Clinical Social Work Psychosocial (Signed)
Clinical Social Work Department BRIEF PSYCHOSOCIAL ASSESSMENT 07/03/2013  Patient:  [name], [name]     Account Number:  [id number]     Admit date:  06/30/2013  Clinical Social Worker:  Lovey Newcomer  Date/Time:  07/03/2013 10:49 AM  Referred by:  Physician  Date Referred:  07/03/2013 Referred for  SNF Placement   Other Referral:   Interview type:  Patient Other interview type:   Patient alert and oriented at time of assessment.    PSYCHOSOCIAL DATA Living Status:  FAMILY Admitted from facility:   Level of care:   Primary support name:  Heather Primary support relationship to patient:  CHILD, ADULT Degree of support available:   Support is adequate.    CURRENT CONCERNS Current Concerns  Other - See comment   Other Concerns:    SOCIAL WORK ASSESSMENT / PLAN CSW met with patient at bedside to complete assessment. Patient sitting upright in chair at bedside. CSW inquired about patient's transportation needs. Patient states that she is connected with [facility] but her interview date was during this hospitalization and she missed it. Patient plans to follow up with [facility] post discharge. Patient states that she lives with her 83 year old grandson and has many family members that are supports for her. CSW offered patient list of local transportation resources for medical/non-medical transport. Patient accepted. Patient is happy to be returning home today.   Assessment/plan status:  No Further Intervention Required Other assessment/ plan:   Information/referral to community resources:   List of transportation resources given to patient.    PATIENT'S/FAMILY'S RESPONSE TO PLAN OF CARE: Patient plans to follow up with [facility] at discharge to complete process of signing up for their services. Patient was pleasant, appropriate, and appreciative of CSW contact. CSW signing off at this time.       Liz Beach MSW, [place], [place], [phone]

## 2013-07-03 NOTE — Discharge Instructions (Signed)
It was a pleasure taking care of you. - Please take Linezolid tablets for another 5 days to treat your bacterial blood infection. These should only cost about $6. Please note, I had to send the prescription to [facility] as your normal pharmacy did not have the medicine in stock. Please take your first pill tonight. - Please use your new Symbicort inhaler every day. This is to treat your COPD. Continue your Spiriva inhaler daily and Albuterol inhaler as needed. - Please follow up in the clinic as scheduled above. - We have also arranged for you to follow up with Dr. Lake Bells, the lung doctor who saw you in the hospital. It is very important that you establish care with him. He can set you up with pulmonary rehabilitation and other needed testing. - Please consider quitting tobacco. This would probably be the best thing for your health. - We have arranged for Home Health Physical Therapy to prevent falls and work on your strength. - If you develop shortness of breath, fever, cough, please call the clinic or return to the ED.    HH-PT arranged with [place]

## 2013-07-08 LAB — CULTURE, BLOOD (ROUTINE X 2)
Culture: NO GROWTH
Culture: NO GROWTH

## 2013-07-09 ENCOUNTER — Ambulatory Visit: Payer: Medicare HMO | Admitting: Internal Medicine

## 2013-07-10 ENCOUNTER — Inpatient Hospital Stay: Payer: Medicare HMO | Admitting: Pulmonary Disease

## 2013-08-07 ENCOUNTER — Encounter: Payer: Self-pay | Admitting: Internal Medicine

## 2013-08-07 ENCOUNTER — Ambulatory Visit (INDEPENDENT_AMBULATORY_CARE_PROVIDER_SITE_OTHER): Payer: Commercial Managed Care - HMO | Admitting: Internal Medicine

## 2013-08-07 VITALS — BP 115/73 | HR 70 | Temp 98.6°F | Ht 65.0 in | Wt 246.7 lb

## 2013-08-07 DIAGNOSIS — J449 Chronic obstructive pulmonary disease, unspecified: Secondary | ICD-10-CM

## 2013-08-07 DIAGNOSIS — J9610 Chronic respiratory failure, unspecified whether with hypoxia or hypercapnia: Secondary | ICD-10-CM

## 2013-08-07 DIAGNOSIS — H1045 Other chronic allergic conjunctivitis: Secondary | ICD-10-CM

## 2013-08-07 DIAGNOSIS — I509 Heart failure, unspecified: Secondary | ICD-10-CM

## 2013-08-07 DIAGNOSIS — I5032 Chronic diastolic (congestive) heart failure: Secondary | ICD-10-CM

## 2013-08-07 DIAGNOSIS — J4489 Other specified chronic obstructive pulmonary disease: Secondary | ICD-10-CM

## 2013-08-07 DIAGNOSIS — H1013 Acute atopic conjunctivitis, bilateral: Secondary | ICD-10-CM | POA: Insufficient documentation

## 2013-08-07 MED ORDER — OLOPATADINE HCL 0.2 % OP SOLN: 1.0000 [drp] | Freq: Two times a day (BID) | OPHTHALMIC | Status: DC

## 2013-08-07 NOTE — Assessment & Plan Note (Signed)
Assessment: Most likely diagnosis: Allergic conjunctivitis in view of clinical symptoms and examination findings. Patient endorses allergies. She does not have red flags for acute conjunctivitis.  Ddx: Viral conjunctivitis however, bacterial conjunctivitis is unlikely.    Plan: 1. Labs/imaging: none 2. Therapy: Will prescribe antihistamine ophthalmic drops: Olopatadine. 3. Follow up: Instructed her to seek emergent medical care in case of photophobia, increased headaches, reduced vision, fevers, or increased drainage from her eyes.

## 2013-08-07 NOTE — Assessment & Plan Note (Signed)
Please see my note under chronic respiratory failure.

## 2013-08-07 NOTE — Progress Notes (Signed)
Patient ID: Georga Stys, female   DOB: [birth year], 58 y.o.   MRN: [id number]   Subjective:   HPI: Ms.Symiah Brissia Delisa is a 58 y.o. with past medical history below presents with 3 days history of bilateral itching of the eyes.  Patient describes acute onset of bilateral itching of the eyes with thin drainage. No change in vision, and no sensation of foreign body in the eyes. She denies fevers, chills, rigors, nausea, vomiting. She denies history of contact with a person, with red eyes. She denies stickiness of her eyelids when she wakes up in the morning. She does not wear contacts. She denies photophobia.  Kindly see the A&P for the status of the pt's chronic medical problems. Specifically, she denies any symptoms for acute COPD exacerbation.    ROS: Constitutional: Denies fever, chills, diaphoresis, appetite change and fatigue.  Respiratory: Denies SOB, DOE, cough, chest tightness, and wheezing. Denies chest pain. CVS: No chest pain, palpitations and leg swelling. She continues to report easy fatigability with exertion.  GI: No abdominal pain, nausea, vomiting, bloody stools GU: No dysuria, frequency, hematuria, or flank pain.  MSK: No myalgias, back pain, joint swelling, arthralgias  Psych: No depression symptoms. No SI or SA.   Past Medical History  Diagnosis Date  . Coronary artery disease     S/P PCI of LAD with DES (12/2008). Total occlusion of RCA noted at that time., medically managed. ACS ruled out 03/2009 with Lexiscan myoview . Followed by [place].  . Pulmonary hypertension     2-D Echo (78/4696) - Systolic pressure was moderately increased. PA peak pressure  1mHg. 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 Followed by Dr. WJoya Gaskins(Velora Heckler  . Diastolic dysfunction     2-D Echo (12/2008) - Normal LV Systolic funciton with EF 60-65%. Grade 1 diastolid dysfunction. No  regional wall motion abnormalities. Moderate pulmonary HTN with PA peak pressure 579mg.  . Marland KitchenOPD (chronic obstructive pulmonary disease)     Severe. Gold Stage IV.  PFTs (12/2008) - severe obstructive airway disease. Active tobacco use. Requires 4L O2 at home.  . Pulmonary nodule, right     Small right middle lobe nodule. Stable as of 12/2008.  . Marland Kitchenleep apnea     Presumptive. No documented sleep studies.   . Prediabetes 12/2008    HgbA1c 6.4 (12/2008)  . Hx MRSA infection     Recurrent MRSA thigh abscesses.  . Tobacco abuse     Ongoing.  . Obesity   . Hyperlipidemia   . GERD (gastroesophageal reflux disease)     S/P Nissen fundoplication.  . Shortness of breath   . CHF (congestive heart failure)    Current Outpatient Prescriptions  Medication Sig Dispense Refill  . albuterol (ACCUNEB) 1.25 MG/3ML nebulizer solution Take 1 ampule by nebulization every 6 (six) hours as needed for wheezing.      . Marland Kitchenlbuterol (PROVENTIL HFA;VENTOLIN HFA) 108 (90 BASE) MCG/ACT inhaler Inhale 1 puff into the lungs every 4 (four) hours as needed for wheezing or shortness of breath.  1 Inhaler  11  . aspirin 81 MG chewable tablet Chew 1 tablet (81 mg total) by mouth daily.  30 tablet  11  . furosemide (LASIX) 20 MG tablet Take 40 mg by mouth 2 (two) times daily.      . Marland Kitchenpratropium (ATROVENT) 0.02 % nebulizer solution Take 2.5 mLs (0.5 mg total) by nebulization every 4 (  four) hours as needed for wheezing.  75 mL  2  . lovastatin (MEVACOR) 20 MG tablet Take 1 tablet (20 mg total) by mouth at bedtime.  30 tablet  11  . tiotropium (SPIRIVA HANDIHALER) 18 MCG inhalation capsule Place 1 capsule (18 mcg total) into inhaler and inhale daily.  30 capsule  12  . budesonide-formoterol (SYMBICORT) 160-4.5 MCG/ACT inhaler Inhale 2 puffs into the lungs 2 (two) times daily.  1 Inhaler  12  . Olopatadine HCl 0.2 % SOLN Apply 1 drop to eye 2 (two) times daily.  2.5 mL  0   No current facility-administered medications for this  visit.   Family History  Problem Relation Age of Onset  . Heart disease Mother 7    Deceased from MI at 59yo  . Hypertension Mother   . Heart disease Father 59    Deceased of MI age 59yo  . Hypertension Father   . Hypertension Brother   . Lung cancer      Grandmother   History   Social History  . Marital Status: Single    Spouse Name: N/A    Number of Children: N/A  . Years of Education: N/A   Social History Main Topics  . Smoking status: Current Every Day Smoker -- 0.50 packs/day for 40 years    Types: Cigarettes  . Smokeless tobacco: Never Used  . Alcohol Use: No  . Drug Use: No  . Sexual Activity: None   Other Topics Concern  . None   Social History Narrative   Formerly worked as a Scientist, water quality, now disabled.   Divorced.   2 grown children.   Lives with her grandson.    Objective:  Physical Exam: Filed Vitals:   08/07/13 1523  BP: 115/73  Pulse: 70  Temp: 98.6 F (37 C)  TempSrc: Oral  Height: _0  (1.651 m)  Weight: 246 lb 11.2 oz (111.902 kg)  SpO2: 90%   Her weight is about baseline.  General: Well nourished. No acute distress.  HEENT: Normal oral mucosa. MMM.  Bilateral conjunctival redness. Visual acuity is preserved. Pupils are equal, and reactive to light bilaterally. Thin discharge from both eyes is appreciated. No evidence of corneal abrasions. No foreign body in the eyes Lungs: CTA bilaterally without evidence of wheezing. She is on oxygen 4 L per minute.Marland Kitchen Heart: RRR; no extra sounds or murmurs  Abdomen: Non-distended, normal BS, soft, nontender; no hepatosplenomegaly  Extremities: minimal bilateral pedal edema. No joint swelling or tenderness. Neurologic: Normal EOM,  Alert and oriented x3. No obvious neurologic/cranial nerve deficits.  Assessment & Plan:  I have discussed my assessment and plan  with  my attending in the clinic, Dr. Ellwood Dense as detailed under problem based charting.

## 2013-08-07 NOTE — Assessment & Plan Note (Signed)
During her most recent hospitalization last month, patient was referred to pulmonology for further management. However, she did not show up for appointment. Emphasized to the patient the importance of pulmonology followup. She is also hesitant about pulmonology rehabilitation even though she understands the benefits. She admits to noncompliance with Symbicort due to the delivery device traumatizing her upper lip. However, she reports, that she is using Spiriva, and albuterol consistently.  Plan. -Reemphasized the need to followup with pulmonology. Patient verbalizes understanding and she stated that she will call Dr. Kyra Searles office to make appointment. - Reemphasized need for pulmonary rehabilitation - I will work with our staff to change her oxygen supplier [facility]. - I advised her to continue using her Spiriva and albuterol. In terms of her Symbicort, patient said that she actually has Advair at home, which I encouraged her to use in place of Symbicort if she pleases.

## 2013-08-07 NOTE — Patient Instructions (Signed)
Please use eye drops  (Olopatadine) for your eyes  Please experience changing your vision he should be evaluated immediately in the emergency department or call the clinic Please continue taking your medications, including inhalers. I strongly encourage you to followup with the lung doctors for your COPD I will order some labs today and if they're of concern, I will call you.

## 2013-08-07 NOTE — Assessment & Plan Note (Signed)
Symptoms symptoms stable at this time. Her weight is about baseline, even though our clinic , weighing scale indicates 246 pounds, which is up from 235 pounds 4 weeks ago - checked from different scale. On review of her chart her baseline dry weight seems to be around 240. She's been compliant with Lasix 40 mg daily. Plan. -Continue with the current diuretic dose of Lasix 40 mg daily - BMP today -She will followup in one month. -Encouraged home weight checks.

## 2013-08-08 LAB — BASIC METABOLIC PANEL WITH GFR
BUN: 8 mg/dL (ref 6–23)
CO2: 36 meq/L — AB (ref 19–32)
Calcium: 8.9 mg/dL (ref 8.4–10.5)
Chloride: 98 mEq/L (ref 96–112)
Creat: 0.71 mg/dL (ref 0.50–1.10)
GFR, Est African American: >89 mL/min
GFR, Est Non African American: >89 mL/min
GLUCOSE: 67 mg/dL — AB (ref 70–99)
Potassium: 4.3 mEq/L (ref 3.5–5.3)
SODIUM: 142 meq/L (ref 135–145)

## 2013-08-08 NOTE — Progress Notes (Signed)
Case discussed with Dr.Kazibwe at the time of the visit.  We reviewed the resident's history and exam and pertinent patient test results.  I agree with the assessment, diagnosis, and plan of care documented in the resident's note.    

## 2013-08-14 ENCOUNTER — Other Ambulatory Visit: Payer: Self-pay | Admitting: Internal Medicine

## 2013-08-14 DIAGNOSIS — J449 Chronic obstructive pulmonary disease, unspecified: Secondary | ICD-10-CM

## 2013-08-19 ENCOUNTER — Emergency Department (HOSPITAL_COMMUNITY): Payer: Medicare HMO

## 2013-08-19 ENCOUNTER — Encounter (HOSPITAL_COMMUNITY): Payer: Self-pay | Admitting: Emergency Medicine

## 2013-08-19 ENCOUNTER — Inpatient Hospital Stay (HOSPITAL_COMMUNITY)
Admission: EM | Admit: 2013-08-19 | Discharge: 2013-08-21 | DRG: 291 | Disposition: A | Discharge status: Home / Self Care | Payer: Medicare HMO | Attending: Internal Medicine | Admitting: Internal Medicine

## 2013-08-19 DIAGNOSIS — I2510 Atherosclerotic heart disease of native coronary artery without angina pectoris: Secondary | ICD-10-CM | POA: Diagnosis present

## 2013-08-19 DIAGNOSIS — G4733 Obstructive sleep apnea (adult) (pediatric): Secondary | ICD-10-CM | POA: Diagnosis present

## 2013-08-19 DIAGNOSIS — E785 Hyperlipidemia, unspecified: Secondary | ICD-10-CM | POA: Diagnosis present

## 2013-08-19 DIAGNOSIS — R7309 Other abnormal glucose: Secondary | ICD-10-CM | POA: Diagnosis present

## 2013-08-19 DIAGNOSIS — E669 Obesity, unspecified: Secondary | ICD-10-CM | POA: Diagnosis present

## 2013-08-19 DIAGNOSIS — Z9861 Coronary angioplasty status: Secondary | ICD-10-CM

## 2013-08-19 DIAGNOSIS — I272 Pulmonary hypertension, unspecified: Secondary | ICD-10-CM

## 2013-08-19 DIAGNOSIS — J4489 Other specified chronic obstructive pulmonary disease: Secondary | ICD-10-CM | POA: Diagnosis present

## 2013-08-19 DIAGNOSIS — J438 Other emphysema: Secondary | ICD-10-CM | POA: Diagnosis present

## 2013-08-19 DIAGNOSIS — I10 Essential (primary) hypertension: Secondary | ICD-10-CM | POA: Diagnosis present

## 2013-08-19 DIAGNOSIS — Z801 Family history of malignant neoplasm of trachea, bronchus and lung: Secondary | ICD-10-CM

## 2013-08-19 DIAGNOSIS — K219 Gastro-esophageal reflux disease without esophagitis: Secondary | ICD-10-CM | POA: Diagnosis present

## 2013-08-19 DIAGNOSIS — R911 Solitary pulmonary nodule: Secondary | ICD-10-CM | POA: Diagnosis present

## 2013-08-19 DIAGNOSIS — Z8249 Family history of ischemic heart disease and other diseases of the circulatory system: Secondary | ICD-10-CM

## 2013-08-19 DIAGNOSIS — I2789 Other specified pulmonary heart diseases: Secondary | ICD-10-CM | POA: Diagnosis present

## 2013-08-19 DIAGNOSIS — F17200 Nicotine dependence, unspecified, uncomplicated: Secondary | ICD-10-CM | POA: Diagnosis present

## 2013-08-19 DIAGNOSIS — Z9981 Dependence on supplemental oxygen: Secondary | ICD-10-CM

## 2013-08-19 DIAGNOSIS — J449 Chronic obstructive pulmonary disease, unspecified: Secondary | ICD-10-CM

## 2013-08-19 DIAGNOSIS — J9620 Acute and chronic respiratory failure, unspecified whether with hypoxia or hypercapnia: Secondary | ICD-10-CM | POA: Diagnosis present

## 2013-08-19 DIAGNOSIS — I5033 Acute on chronic diastolic (congestive) heart failure: Principal | ICD-10-CM | POA: Diagnosis present

## 2013-08-19 DIAGNOSIS — IMO0002 Reserved for concepts with insufficient information to code with codable children: Secondary | ICD-10-CM

## 2013-08-19 DIAGNOSIS — H1045 Other chronic allergic conjunctivitis: Secondary | ICD-10-CM | POA: Diagnosis present

## 2013-08-19 DIAGNOSIS — E662 Morbid (severe) obesity with alveolar hypoventilation: Secondary | ICD-10-CM | POA: Diagnosis present

## 2013-08-19 DIAGNOSIS — Z6841 Body Mass Index (BMI) 40.0 and over, adult: Secondary | ICD-10-CM

## 2013-08-19 DIAGNOSIS — I509 Heart failure, unspecified: Secondary | ICD-10-CM | POA: Diagnosis present

## 2013-08-19 DIAGNOSIS — Z7982 Long term (current) use of aspirin: Secondary | ICD-10-CM

## 2013-08-19 DIAGNOSIS — D696 Thrombocytopenia, unspecified: Secondary | ICD-10-CM | POA: Diagnosis present

## 2013-08-19 DIAGNOSIS — Z79899 Other long term (current) drug therapy: Secondary | ICD-10-CM

## 2013-08-19 DIAGNOSIS — Z888 Allergy status to other drugs, medicaments and biological substances status: Secondary | ICD-10-CM

## 2013-08-19 DIAGNOSIS — Z720 Tobacco use: Secondary | ICD-10-CM | POA: Diagnosis present

## 2013-08-19 DIAGNOSIS — Z8614 Personal history of Methicillin resistant Staphylococcus aureus infection: Secondary | ICD-10-CM

## 2013-08-19 LAB — BASIC METABOLIC PANEL
BUN: 12 mg/dL (ref 6–23)
CHLORIDE: 99 meq/L (ref 96–112)
CO2: 39 mEq/L — ABNORMAL HIGH (ref 19–32)
CREATININE: 0.71 mg/dL (ref 0.50–1.10)
Calcium: 9.2 mg/dL (ref 8.4–10.5)
GFR calc Af Amer: >90 mL/min (ref >90)
GFR calc non Af Amer: >90 mL/min (ref >90)
Glucose, Bld: 97 mg/dL (ref 70–99)
Potassium: 5 mEq/L (ref 3.7–5.3)
Sodium: 142 mEq/L (ref 137–147)

## 2013-08-19 LAB — HEPATIC FUNCTION PANEL
ALBUMIN: 3.5 g/dL (ref 3.5–5.2)
ALK PHOS: 103 U/L (ref 39–117)
ALT: 10 U/L (ref 0–35)
AST: 13 U/L (ref 0–37)
Bilirubin, Direct: <0.2 mg/dL (ref 0.0–0.3)
Total Bilirubin: 0.5 mg/dL (ref 0.3–1.2)
Total Protein: 6.9 g/dL (ref 6.0–8.3)

## 2013-08-19 LAB — BLOOD GAS, ARTERIAL
ACID-BASE EXCESS: 13.1 mmol/L — AB (ref 0.0–2.0)
BICARBONATE: 39.1 meq/L — AB (ref 20.0–24.0)
Drawn by: 23404
O2 Content: 4 L/min
O2 SAT: 87.7 %
PATIENT TEMPERATURE: 98.6
TCO2: 41.3 mmol/L (ref 0–100)
pCO2 arterial: 71 mmHg (ref 35.0–45.0)
pH, Arterial: 7.36 (ref 7.350–7.450)
pO2, Arterial: 55.5 mmHg — ABNORMAL LOW (ref 80.0–100.0)

## 2013-08-19 LAB — CBC WITH DIFFERENTIAL/PLATELET
Basophils Absolute: 0 10*3/uL (ref 0.0–0.1)
Basophils Relative: 1 % (ref 0–1)
Eosinophils Absolute: 0.1 10*3/uL (ref 0.0–0.7)
Eosinophils Relative: 2 % (ref 0–5)
HEMATOCRIT: 42.8 % (ref 36.0–46.0)
HEMOGLOBIN: 13.2 g/dL (ref 12.0–15.0)
Lymphocytes Relative: 16 % (ref 12–46)
Lymphs Abs: 0.9 10*3/uL (ref 0.7–4.0)
MCH: 30.8 pg (ref 26.0–34.0)
MCHC: 30.8 g/dL (ref 30.0–36.0)
MCV: 100 fL (ref 78.0–100.0)
MONO ABS: 0.5 10*3/uL (ref 0.1–1.0)
MONOS PCT: 9 % (ref 3–12)
Neutro Abs: 4 10*3/uL (ref 1.7–7.7)
Neutrophils Relative %: 72 % (ref 43–77)
Platelets: 124 10*3/uL — ABNORMAL LOW (ref 150–400)
RBC: 4.28 MIL/uL (ref 3.87–5.11)
RDW: 14.3 % (ref 11.5–15.5)
WBC: 5.5 10*3/uL (ref 4.0–10.5)

## 2013-08-19 LAB — MAGNESIUM: MAGNESIUM: 1.7 mg/dL (ref 1.5–2.5)

## 2013-08-19 LAB — PHOSPHORUS: Phosphorus: 4 mg/dL (ref 2.3–4.6)

## 2013-08-19 LAB — PRO B NATRIURETIC PEPTIDE: PRO B NATRI PEPTIDE: 1093 pg/mL — AB (ref 0–125)

## 2013-08-19 LAB — TROPONIN I: Troponin I: <0.3 ng/mL (ref <0.30)

## 2013-08-19 LAB — I-STAT TROPONIN, ED: Troponin i, poc: 0 ng/mL (ref 0.00–0.08)

## 2013-08-19 LAB — GLUCOSE, CAPILLARY: Glucose-Capillary: 269 mg/dL — ABNORMAL HIGH (ref 70–99)

## 2013-08-19 LAB — HEMOGLOBIN A1C
Hgb A1c MFr Bld: 5.9 % — ABNORMAL HIGH (ref <5.7)
Mean Plasma Glucose: 123 mg/dL — ABNORMAL HIGH (ref <117)

## 2013-08-19 MED ORDER — ALBUTEROL SULFATE (2.5 MG/3ML) 0.083% IN NEBU: 5.0000 mg | INHALATION_SOLUTION | RESPIRATORY_TRACT | Status: AC | PRN

## 2013-08-19 MED ORDER — FUROSEMIDE 10 MG/ML IJ SOLN
20.0000 mg | Freq: Once | INTRAMUSCULAR | Status: AC
  Administered 2013-08-19: 20 mg via INTRAVENOUS
  Filled 2013-08-19: qty 2

## 2013-08-19 MED ORDER — SODIUM CHLORIDE 0.9 % IJ SOLN: 3.0000 mL | INTRAMUSCULAR | Status: DC | PRN

## 2013-08-19 MED ORDER — METHYLPREDNISOLONE SODIUM SUCC 125 MG IJ SOLR
125.0000 mg | Freq: Once | INTRAMUSCULAR | Status: AC
  Administered 2013-08-19: 125 mg via INTRAVENOUS
  Filled 2013-08-19: qty 2

## 2013-08-19 MED ORDER — SODIUM CHLORIDE 0.9 % IJ SOLN
3.0000 mL | Freq: Two times a day (BID) | INTRAMUSCULAR | Status: DC
  Administered 2013-08-19 – 2013-08-20 (×3): 3 mL via INTRAVENOUS

## 2013-08-19 MED ORDER — SODIUM CHLORIDE 0.9 % IJ SOLN
3.0000 mL | Freq: Two times a day (BID) | INTRAMUSCULAR | Status: DC
  Administered 2013-08-20: 3 mL via INTRAVENOUS

## 2013-08-19 MED ORDER — IPRATROPIUM-ALBUTEROL 0.5-2.5 (3) MG/3ML IN SOLN
3.0000 mL | Freq: Once | RESPIRATORY_TRACT | Status: AC
  Administered 2013-08-19: 3 mL via RESPIRATORY_TRACT
  Filled 2013-08-19: qty 3

## 2013-08-19 MED ORDER — SODIUM CHLORIDE 0.9 % IV SOLN: 250.0000 mL | INTRAVENOUS | Status: DC | PRN

## 2013-08-19 MED ORDER — BENZONATATE 100 MG PO CAPS
100.0000 mg | ORAL_CAPSULE | Freq: Three times a day (TID) | ORAL | Status: DC
  Administered 2013-08-19 – 2013-08-20 (×3): 100 mg via ORAL
  Filled 2013-08-19 (×4): qty 1

## 2013-08-19 MED ORDER — ASPIRIN 81 MG PO CHEW
81.0000 mg | CHEWABLE_TABLET | Freq: Every day | ORAL | Status: DC
  Administered 2013-08-19 – 2013-08-21 (×3): 81 mg via ORAL
  Filled 2013-08-19 (×2): qty 1

## 2013-08-19 MED ORDER — FUROSEMIDE 10 MG/ML IJ SOLN
60.0000 mg | Freq: Every day | INTRAMUSCULAR | Status: DC
  Administered 2013-08-20: 60 mg via INTRAVENOUS
  Filled 2013-08-19 (×2): qty 6

## 2013-08-19 MED ORDER — ENOXAPARIN SODIUM 40 MG/0.4ML ~~LOC~~ SOLN
40.0000 mg | SUBCUTANEOUS | Status: DC
  Administered 2013-08-19: 40 mg via SUBCUTANEOUS
  Filled 2013-08-19 (×3): qty 0.4

## 2013-08-19 MED ORDER — ALBUTEROL SULFATE (2.5 MG/3ML) 0.083% IN NEBU
5.0000 mg | INHALATION_SOLUTION | Freq: Once | RESPIRATORY_TRACT | Status: AC
  Administered 2013-08-19: 5 mg via RESPIRATORY_TRACT
  Filled 2013-08-19: qty 6

## 2013-08-19 MED ORDER — IPRATROPIUM BROMIDE 0.02 % IN SOLN
0.5000 mg | Freq: Once | RESPIRATORY_TRACT | Status: AC
  Administered 2013-08-19: 0.5 mg via RESPIRATORY_TRACT
  Filled 2013-08-19: qty 2.5

## 2013-08-19 MED ORDER — ACETAMINOPHEN 500 MG PO TABS
500.0000 mg | ORAL_TABLET | ORAL | Status: DC | PRN
  Administered 2013-08-19 – 2013-08-20 (×3): 500 mg via ORAL
  Filled 2013-08-19 (×3): qty 1

## 2013-08-19 MED ORDER — NICOTINE 21 MG/24HR TD PT24
21.0000 mg | MEDICATED_PATCH | Freq: Every day | TRANSDERMAL | Status: DC
  Administered 2013-08-19 – 2013-08-21 (×3): 21 mg via TRANSDERMAL
  Filled 2013-08-19 (×3): qty 1

## 2013-08-19 MED ORDER — IPRATROPIUM-ALBUTEROL 0.5-2.5 (3) MG/3ML IN SOLN
3.0000 mL | RESPIRATORY_TRACT | Status: DC
  Administered 2013-08-19 – 2013-08-21 (×9): 3 mL via RESPIRATORY_TRACT
  Filled 2013-08-19 (×8): qty 3

## 2013-08-19 MED ORDER — POLYVINYL ALCOHOL 1.4 % OP SOLN
1.0000 [drp] | OPHTHALMIC | Status: DC | PRN
  Filled 2013-08-19: qty 15

## 2013-08-19 MED ORDER — FUROSEMIDE 10 MG/ML IJ SOLN
40.0000 mg | Freq: Once | INTRAMUSCULAR | Status: AC
  Administered 2013-08-19: 40 mg via INTRAVENOUS
  Filled 2013-08-19: qty 4

## 2013-08-19 NOTE — ED Notes (Signed)
Patient transported to X-ray

## 2013-08-19 NOTE — ED Provider Notes (Signed)
CSN: [id number]     Arrival date & time 08/19/13  1038 History   First MD Initiated Contact with Patient 08/19/13 1042     Chief Complaint  Patient presents with  . Shortness of Breath     (Consider location/radiation/quality/duration/timing/severity/associated sxs/prior Treatment) Patient is a 58 y.o. female presenting with shortness of breath. The history is provided by the patient and medical records.  Shortness of Breath Associated symptoms: cough and wheezing    This is a 58 year old female with past medical history significant for COPD, coronary artery disease status post stenting, pulmonary hypertension, congestive heart failure, presented to the ED for shortness of breath which began last night.   The patient is on 4 L nasal cannula at all times, states she increased it to 6 L with some improvement. Patient endorses recent nonproductive cough. No associated fever or chills. No recent sick contacts.  Patient states she has not been doing breathing treatments at home as her nebulizer machine is stacked on a shelf and has no one to help her get it down.  She has been using her inhalers. No significant weight change or change in nighttime orthopnea.  She denies any chest pain or palpitations.   She has previously seen Dr. Percival Spanish of [facility] cardiology after stentint-- no FU in approx 2 years but is hoping to re-establish.  Pt is currently being set up with [facility] pulmonology as well.  Pt continues to be a daily smoker.  Past Medical History  Diagnosis Date  . Coronary artery disease     S/P PCI of LAD with DES (12/2008). Total occlusion of RCA noted at that time., medically managed. ACS ruled out 03/2009 with Lexiscan myoview . Followed by [place].  . Pulmonary hypertension     2-D Echo (51/7616) - Systolic pressure was moderately increased. PA peak pressure  24mHg. secondary pulm htn likely on basis of comb of interstital lung disease, severe copd, small airways disease, severe sleep  apnea and cor pulmonale,. Followed by Dr. WJoya Gaskins(Velora Heckler  . Diastolic dysfunction     2-D Echo (12/2008) - Normal LV Systolic funciton with EF 60-65%. Grade 1 diastolid dysfunction. No regional wall motion abnormalities. Moderate pulmonary HTN with PA peak pressure 558mg.  . Marland KitchenOPD (chronic obstructive pulmonary disease)     Severe. Gold Stage IV.  PFTs (12/2008) - severe obstructive airway disease. Active tobacco use. Requires 4L O2 at home.  . Pulmonary nodule, right     Small right middle lobe nodule. Stable as of 12/2008.  . Marland Kitchenleep apnea     Presumptive. No documented sleep studies.   . Prediabetes 12/2008    HgbA1c 6.4 (12/2008)  . Hx MRSA infection     Recurrent MRSA thigh abscesses.  . Tobacco abuse     Ongoing.  . Obesity   . Hyperlipidemia   . GERD (gastroesophageal reflux disease)     S/P Nissen fundoplication.  . Shortness of breath   . CHF (congestive heart failure)    Past Surgical History  Procedure Laterality Date  . Total abdominal hysterectomy w/ bilateral salpingoophorectomy    . Nissen fundoplication     Family History  Problem Relation Age of Onset  . Heart disease Mother 4772  Deceased from MI at 59yo. Hypertension Mother   . Heart disease Father [id number]  Deceased of MI age 59yo. Hypertension Father   . Hypertension Brother   . Lung cancer      Grandmother  History  Substance Use Topics  . Smoking status: Current Every Day Smoker -- 0.50 packs/day for 40 years    Types: Cigarettes  . Smokeless tobacco: Never Used  . Alcohol Use: No   OB History   Grav Para Term Preterm Abortions TAB SAB Ect Mult Living                 Review of Systems  Respiratory: Positive for cough, shortness of breath and wheezing.   All other systems reviewed and are negative.     Allergies  Fluconazole  Home Medications   Prior to Admission medications   Medication Sig Start Date End Date Taking? Authorizing Provider  albuterol (ACCUNEB) 1.25 MG/3ML  nebulizer solution Take 1 ampule by nebulization every 6 (six) hours as needed for wheezing.    Historical Provider, MD  albuterol (PROVENTIL HFA;VENTOLIN HFA) 108 (90 BASE) MCG/ACT inhaler Inhale 1 puff into the lungs every 4 (four) hours as needed for wheezing or shortness of breath. 06/13/13   Jessee Avers, MD  aspirin 81 MG chewable tablet Chew 1 tablet (81 mg total) by mouth daily. 11/09/12   Charlann Lange, MD  budesonide-formoterol (SYMBICORT) 160-4.5 MCG/ACT inhaler Inhale 2 puffs into the lungs 2 (two) times daily. 07/03/13   Lesly Dukes, MD  furosemide (LASIX) 20 MG tablet Take 40 mg by mouth 2 (two) times daily. 06/12/13   Jessee Avers, MD  ipratropium (ATROVENT) 0.02 % nebulizer solution Take 2.5 mLs (0.5 mg total) by nebulization every 4 (four) hours as needed for wheezing. 01/09/13 01/09/14  Jessee Avers, MD  lovastatin (MEVACOR) 20 MG tablet Take 1 tablet (20 mg total) by mouth at bedtime. 06/12/13   Jessee Avers, MD  Olopatadine HCl 0.2 % SOLN Apply 1 drop to eye 2 (two) times daily. 08/07/13   Jessee Avers, MD  tiotropium (SPIRIVA HANDIHALER) 18 MCG inhalation capsule Place 1 capsule (18 mcg total) into inhaler and inhale daily. 06/12/13   Jessee Avers, MD   BP 132/74  Pulse 71  Temp(Src) 98.2 F (36.8 C) (Oral)  Resp 20  Ht _0  (1.626 m)  Wt 246 lb (111.585 kg)  BMI 42.21 kg/m2  SpO2 92%  Physical Exam  Nursing note and vitals reviewed. Constitutional: She is oriented to person, place, and time. She appears well-developed and well-nourished. No distress.  6L via [place]  HENT:  Head: Normocephalic and atraumatic.  Mouth/Throat: Oropharynx is clear and moist.  Eyes: Conjunctivae and EOM are normal. Pupils are equal, round, and reactive to light.  Neck: Normal range of motion.  Cardiovascular: Normal rate, regular rhythm and normal heart sounds.   Pulmonary/Chest: Effort normal. No accessory muscle usage. Not tachypneic. No respiratory distress. She has wheezes. She has rales.   Normal work of breathing without accessory muscle use, scattered wheezes and rales at bilateral bases, speaking in full complete sentences without difficulty  Abdominal: Soft. Bowel sounds are normal.  Musculoskeletal: Normal range of motion.  Trace edema bilateral ankles  Neurological: She is alert and oriented to person, place, and time.  Skin: Skin is warm and dry.  Psychiatric: She has a normal mood and affect.    ED Course  Procedures (including critical care time) Labs Review Labs Reviewed  CBC WITH DIFFERENTIAL - Abnormal; Notable for the following:    Platelets 124 (*)    All other components within normal limits  BASIC METABOLIC PANEL - Abnormal; Notable for the following:    CO2 39 (*)    All other components  within normal limits  PRO B NATRIURETIC PEPTIDE - Abnormal; Notable for the following:    Pro B Natriuretic peptide (BNP) 1093.0 (*)    All other components within normal limits  Randolm Idol, ED    Imaging Review Dg Chest 2 View  08/19/2013   CLINICAL DATA:  Shortness of Breath  EXAM: CHEST  2 VIEW  COMPARISON:  July 01, 2013 and May 13, 2013  FINDINGS: Interstitium in the mid and lower lung zones is thickened, a chronic finding. There is a minimal right effusion. There is cardiomegaly with pulmonary venous hypertension. There is no airspace consolidation. There are multiple surgical clips in the left upper abdomen. There is no appreciable adenopathy. There is degenerative change in the thoracic spine.  IMPRESSION: Findings indicative a degree of chronic congestive heart failure. No appreciable airspace consolidation.   Electronically Signed   By: Lowella Grip M.D.   On: 08/19/2013 11:30     EKG Interpretation None      MDM   Final diagnoses:  Acute on chronic respiratory failure  Acute on chronic diastolic heart failure  COPD (chronic obstructive pulmonary disease)  Pulmonary hypertension  TOBACCO ABUSE   58 year old female with hx of COPD,  CHF, on chronic supplemental oxygen therapy, presenting to the ED for shortness of breath. On exam she is in no acute distress. She does have scattered wheezes and rales at bilateral bases.  Will EKG, basic labs including CBC, BMP, BNP, trop, and CXR.  Solu-medrol and nebs given.  Will monitor closely.  After 3 nebs, dose of lasix, solu-medrol no significant improvement in condition.  On re-checks pt continues to be hypoxic with sats from 88-91% on 6L via [place], however she is in NAD and do not feel she required Bipap at this time.  Will admit for further management of her acute on chronic respiratory failure, likely secondary to combination of COPD/CHF.  Discussed with IM resident, Dr. Aundra Dubin who will admit to telemetry.  Temp admission orders placed.  Larene Pickett, PA-C 08/19/13 1455

## 2013-08-19 NOTE — H&P (Signed)
Date: 08/19/2013               Patient Name:  [name] MRN: [id number]  DOB: 17-Sep-1955 Age / Sex: 58 y.o., female   PCP: Morgan Avers, MD           Medical Service: Internal Medicine Teaching Service         Attending Physician: Dr. Madilyn Fireman, MD    First Contact: Dr. Michail Jewels, MD Pager: [phone] (7AM-5PM Mon-Fri)  Second Contact: Dr. Karlyn Agee, MD Pager: [phone]       After Hours (After 5p/  First Contact Pager: [phone]  weekends / holidays): Second Contact Pager: [phone]    Most Recent Discharge Date:  07/03/13  Chief Complaint:  Chief Complaint  Patient presents with  . Shortness of Breath      History of Present Illness:  [name] is a 58 y.o. female with a PMH of CAD, COPD (on 4L continuous home O2) with pulmonary hypertension, diastolic dysfunction, right pulmonary nodule, OSA, dyslipidemia, and GERD.  Pt presents to the ED with SOB, cough (with clear sputum production), and wheezing that has worsened since last night.  She states that she was sitting in her chair yesterday evening when she suddenly became short of breath.  She denies any CP, palpitations, N/V/D/C, abdominal pain, or dysuria.  She does report some chills (no fever), PND and LE swelling, but denies any orthopnea.  She denies any long distance travel or any sick contacts.  No history of blood clots.  She has never been intubated and was on prednisone and antibiotics in April 2015.  She reports compliance with her lasix 76m daily and with her inhalers.  She denies missing any doses besides today.  She does report consuming more sodium recently and had a sausage biscuit yesterday.  She has been using her rescue inhaler more frequently yesterday about 2-3 times which is unusual for her.  She is still smoking about 1 ppd of cigarettes but would like to quit.  She has not seen a pulmonologist in 4 years since she was diagnosed with COPD.  She does not weight herself daily but reports she  has gained weight recently.  According to chart review, she was 235 lbs on 3/31 and increased to 246 on 5/6, and is now 240.    Of note, she was last discharged on 07/03/13 with acute respiratory failure secondary to COPD/CHF.  Pt has been admitted 3 times in the past 6 months for similar issues.    ED course: In the ED, pt was given duonebs x2, albuterol/atrovent, 1222msolumedrol, and 2048mV lasix.  When we saw her she was saturating 94% on 6L [place].  She does not wish to be intubated but is agreeable to CPR.    Meds: Current Facility-Administered Medications  Medication Dose Route Frequency Provider Last Rate Last Dose  . 0.9 %  sodium chloride infusion  250 mL Intravenous PRN Morgan GenreD      . acetaminophen (TYLENOL) tablet 500 mg  500 mg Oral Q4H PRN Morgan GenreD   500 mg at 08/19/13 1717  . albuterol (PROVENTIL) (2.5 MG/3ML) 0.083% nebulizer solution 5 mg  5 mg Nebulization Q4H PRN Morgan PickettA-C      . aspirin chewable tablet 81 mg  81 mg Oral Daily Morgan GenreD   81 mg at 08/19/13 2000  . benzonatate (TESSALON) capsule 100 mg  100 mg Oral TID  Morgan Genre, MD   100 mg at 08/19/13 2137  . enoxaparin (LOVENOX) injection 40 mg  40 mg Subcutaneous Q24H Morgan Genre, MD   40 mg at 08/19/13 2000  . [START ON 08/20/2013] furosemide (LASIX) injection 60 mg  60 mg Intravenous Daily Morgan Genre, MD      . ipratropium-albuterol (DUONEB) 0.5-2.5 (3) MG/3ML nebulizer solution 3 mL  3 mL Nebulization Q4H [name]   3 mL at 08/19/13 2053  . nicotine (NICODERM CQ - dosed in mg/24 hours) patch 21 mg  21 mg Transdermal Daily Morgan Genre, MD   21 mg at 08/19/13 2000  . polyvinyl alcohol (LIQUIFILM TEARS) 1.4 % ophthalmic solution 1 drop  1 drop Both Eyes PRN Morgan Genre, MD      . sodium chloride 0.9 % injection 3 mL  3 mL Intravenous Q12H Morgan Genre, MD      . sodium chloride 0.9 % injection 3 mL  3 mL Intravenous Q12H Morgan Genre, MD   3 mL at 08/19/13  2136  . sodium chloride 0.9 % injection 3 mL  3 mL Intravenous PRN Morgan Genre, MD       Prescriptions prior to admission  Medication Sig Dispense Refill  . albuterol (ACCUNEB) 1.25 MG/3ML nebulizer solution Take 1 ampule by nebulization every 6 (six) hours as needed for wheezing.      Marland Kitchen albuterol (PROVENTIL HFA;VENTOLIN HFA) 108 (90 BASE) MCG/ACT inhaler Inhale 1 puff into the lungs every 4 (four) hours as needed for wheezing or shortness of breath.  1 Inhaler  11  . aspirin 81 MG chewable tablet Chew 1 tablet (81 mg total) by mouth daily.  30 tablet  11  . Fluticasone-Salmeterol (ADVAIR) 500-50 MCG/DOSE AEPB Inhale 1 puff into the lungs 2 (two) times daily.      . furosemide (LASIX) 20 MG tablet Take 20 mg by mouth 2 (two) times daily.       Marland Kitchen lovastatin (MEVACOR) 20 MG tablet Take 1 tablet (20 mg total) by mouth at bedtime.  30 tablet  11  . Olopatadine HCl 0.2 % SOLN Apply 1 drop to eye 2 (two) times daily.  2.5 mL  0  . tiotropium (SPIRIVA HANDIHALER) 18 MCG inhalation capsule Place 1 capsule (18 mcg total) into inhaler and inhale daily.  30 capsule  12    Allergies: Allergies as of 08/19/2013 - Review Complete 08/19/2013  Allergen Reaction Noted  . Fluconazole Anaphylaxis     PMH: Past Medical History  Diagnosis Date  . Coronary artery disease     S/P PCI of LAD with DES (12/2008). Total occlusion of RCA noted at that time., medically managed. ACS ruled out 03/2009 with Lexiscan myoview . Followed by [facility].  . Pulmonary hypertension     2-D Echo (02/314) - Systolic pressure was moderately increased. PA peak pressure  71mHg. 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 Followed by [name]([name]  . Diastolic dysfunction     2-D Echo (12/2008) - Normal LV Systolic funciton with EF 60-65%. Grade 1 diastolid dysfunction. No regional wall motion abnormalities. Moderate  pulmonary HTN with PA peak pressure 551mg.  . Marland KitchenOPD (chronic obstructive pulmonary disease)     Severe. Gold Stage IV.  PFTs (12/2008) - severe obstructive airway disease. Active tobacco use. Requires 4L O2 at home.  . Pulmonary nodule,  right     Small right middle lobe nodule. Stable as of 12/2008.  Marland Kitchen Sleep apnea     Presumptive. No documented sleep studies.   . Prediabetes 12/2008    HgbA1c 6.4 (12/2008)  . Hx MRSA infection     Recurrent MRSA thigh abscesses.  . Tobacco abuse     Ongoing.  . Obesity   . Hyperlipidemia   . GERD (gastroesophageal reflux disease)     S/P Nissen fundoplication.  . Shortness of breath   . CHF (congestive heart failure)     PSH: Past Surgical History  Procedure Laterality Date  . Total abdominal hysterectomy w/ bilateral salpingoophorectomy    . Nissen fundoplication      FH: Family History  Problem Relation Age of Onset  . Heart disease Mother 1    Deceased from MI at 71yo  . Hypertension Mother   . Heart disease Father 47    Deceased of MI age 28yo  . Hypertension Father   . Hypertension Brother   . Lung cancer      Grandmother    SH: History  Substance Use Topics  . Smoking status: Current Every Day Smoker -- 0.50 packs/day for 40 years    Types: Cigarettes  . Smokeless tobacco: Never Used  . Alcohol Use: No    Review of Systems: Pertinent items are noted in HPI.  Physical Exam: Filed Vitals:   08/19/13 2054  BP:   Pulse: 88  Temp:   Resp: 14   Physical Exam Constitutional: Vital signs reviewed.  Patient is a well-developed and well-nourished female in no acute distress who appears comfortable and is cooperative with exam.  Head: Normocephalic and atraumatic Eyes: PERRL, EOMI, conjunctivae injected, no scleral icterus.  Bilateral xanthelasma.   Neck: Supple, trachea midline no JVD appreciated, or carotid bruit present.  Cardiovascular: RRR, no MRG, pulses symmetric and intact bilaterally Pulmonary/Chest: normal  respiratory effort, no accessory muscle use, bibasilar crackles without wheezes or rhonchi Abdominal: Obese.  Soft. Non-tender, non-distended, bowel sounds are normal Neurological: A&O x3, cranial nerve II-XII are grossly intact, no focal motor deficit, tremulous with asterixis  Extremities: no c/c, trace-1+ LE pitting edema R>L Skin: Warm, dry and intact. No rash, cyanosis, or clubbing.   Lab results:  Basic Metabolic Panel:  Recent Labs  08/19/13 1055 08/19/13 1751  NA 142  --   K 5.0  --   CL 99  --   CO2 39*  --   GLUCOSE 97  --   BUN 12  --   CREATININE 0.71  --   CALCIUM 9.2  --   MG  --  1.7  PHOS  --  4.0   Anion Gap: 4  Calcium/Magnesium/Phosphorus:  Recent Labs Lab 08/19/13 1055 08/19/13 1751  CALCIUM 9.2  --   MG  --  1.7  PHOS  --  4.0    Liver Function Tests:  Recent Labs  08/19/13 1751  AST 13  ALT 10  ALKPHOS 103  BILITOT 0.5  PROT 6.9  ALBUMIN 3.5   CBC: Lab Results  Component Value Date   WBC 5.5 08/19/2013   HGB 13.2 08/19/2013   HCT 42.8 08/19/2013   MCV 100.0 08/19/2013   PLT 124* 08/19/2013   Lipase: No results found for this basename: LIPASE   Lactic Acid/Procalcitonin: No results found for this basename: LATICACIDVEN, PROCALCITON, O2SATVEN,  in the last 168 hours  Cardiac Enzymes:  Recent Labs  08/19/13 1107  TROPIPOC 0.00  Lab Results  Component Value Date   CKTOTAL 27 09/08/2010   CKMB 1.7 09/08/2010   TROPONINI <0.30 08/19/2013   BNP:  Recent Labs  08/19/13 1055  PROBNP 1093.0*   D-Dimer: No results found for this basename: DDIMER,  in the last 72 hours  Urine Drug Screen: Drugs of Abuse:     Component Value Date/Time   LABOPIA NONE DETECTED 07/01/2013 [place] 02/04/2009 [place] DETECTED 07/01/2013 [place] 02/04/2009 2315   LABBENZ NONE DETECTED 07/01/2013 [place] 02/04/2009 2315   AMPHETMU NONE DETECTED 07/01/2013 [place]  02/04/2009 2315   THCU NONE DETECTED 07/01/2013 1218   LABBARB NONE DETECTED 07/01/2013 1218    Urinalysis:   Imaging results:  Dg Chest 2 View  08/19/2013   CLINICAL DATA:  Shortness of Breath  EXAM: CHEST  2 VIEW  COMPARISON:  July 01, 2013 and May 13, 2013  FINDINGS: Interstitium in the mid and lower lung zones is thickened, a chronic finding. There is a minimal right effusion. There is cardiomegaly with pulmonary venous hypertension. There is no airspace consolidation. There are multiple surgical clips in the left upper abdomen. There is no appreciable adenopathy. There is degenerative change in the thoracic spine.  IMPRESSION: Findings indicative a degree of chronic congestive heart failure. No appreciable airspace consolidation.   Electronically Signed   By: Lowella Grip M.D.   On: 08/19/2013 11:30    EKG: EKG Interpretation  Date/Time:  Monday Aug 19 2013 10:42:21 EDT Ventricular Rate:  73 PR Interval:  139 QRS Duration: 93 QT Interval:  360 QTC Calculation: 397 R Axis:   105 Text Interpretation:  Sinus rhythm Consider right atrial enlargement Probable RVH w/ secondary repol abnormality Nonspecific T wave abnormality No significant change since last tracing Confirmed by Ashok Cordia  MD, Lennette Bihari ([id number]) on 08/19/2013 10:57:48 AM  Antibiotics: Antibiotics Given (last 72 hours)   None      Anti-infectives   None      SIRS/Sepsis/Septic Shock criteria met:  No  Consults:    Assessment & Plan by Problem: Principal Problem:   Acute on chronic respiratory failure Active Problems:   TOBACCO ABUSE   PULMONARY HYPERTENSION, SECONDARY   CHRONIC OBSTRUCTIVE PULMONARY DISEASE   SLEEP APNEA   Acute on chronic diastolic heart failure  [name] is a 58 y.o. female with a PMH of CAD, COPD (on 4L continuous home O2) with pulmonary hypertension, diastolic dysfunction, right pulmonary nodule, OSA, dyslipidemia, and GERD.  Pt presents to the ED with SOB, cough (with clear  sputum production), and wheezing that has worsened since last night.   Acute on chronic hypoxemic and hypercapnic respiratory failure  Pt presented with SOB, cough, and wheezing since last PM.  She is without wheezing on our exam but has bibasilar crackles.  SpO2 94 on 6L [place].  11/07/12 TTE: LVEF 55-60% with LA/RA dilation, with increased PAP 27mgHg.  5/18 CXR shows chronic CHF with no appreciable airspace consolidation.  There is cardiomegaly with pulmonary venous hypertension with minimal right effusion.  Most likely c/w acute diastolic CHF exacerbation given CXR findings, questionable compliance with lasix, and elevated BNP (1093) from 167 in February 2015.  AECOPD is also possible but unlikely as pt is without wheezing on exam.  OSA, pulmonary HTN, and obesity hypoventilation syndrome likely contributing also.  PE is unlikely given low Wells score and would not be expected to cause  hypercapnic failure.  Trop-i was negative.  PNA unlikely given afebrile without leukocytosis and negative CXR.   -lasix 78m IV given in ED with additional 450mIV Now -lasix 6074mV qd -trops x 3 -influenza panel, strep pneumo/legionella antigen -check PCT, hepatic panel -duonebs q4h   -albuterol q2h PRN -consider prednisone 78m67mily x 5 days if patient doesn't improve with lasix  -O2 [place] to keep O2sat>92%  -provide smoking cessation education -continuous pulse oximetry -PT/OT -ABG if decompensates -consult to respiratory care -strict Is/Os and daily weights  COPD  Pt reports increased SOB, cough, and sputum production compared to baseline.  O2 sats in the ED were 94% on 6L [place].  Pt is on continuous home O2_0 . CXR was c/w chronic CHF without appreciable airspace consolidation.  CXR excludes PNA and pneumothorax.  Wells score: 0 so risk of PE is low probability.  Pt last exacerbation requiring hospitalization was March 2015 and has never been intubated.  Pt reports compliance with medications and has been using her  rescue inhaler more frequently.  Pt last use of antibiotics was March 2015 and last use of prednisone was March 2015.  She was supposed to follow up with pulmonology according to OV notes, but did not go to her appointment.    -per above  Hypertension  Stable.  -continue home meds   CAD Cardiac cath on 11/05/12 showed moderate PAH.  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 On ASA 81mg12montinue ASA 81mg 46mstatin   Allergic conjuctivitis -lubricating eye drops  h/o Prediabetes Last HA1C 5.6 which does not fall in the category of prediabetes.   Lab Results  Component Value Date   HGBA1C 5.6 04/24/2013   h/o GERD  Not on any meds at home.  -monitor  Dyslipidemia  Pt is on lovastatin 20mg a101mme.  Bilateral xanthelasma.   Lab Results  Component Value Date   LDLCALC 136* 11/09/2012  -continue statin therapy  Mild chronic thrombocytopenia Platelets on admission 124 without symptoms/signs of bleeding.    -monitor for bleeding -monitor cbc  Obstructive sleep apnea  -CPAP   Substance abuse  Pt has a h/o tobacco use.  She is still smoking 1 ppd.  -nicotine patch 21mg da45m-CSW consult  FEN  Fluids-None Electrolytes- Nutrition- Carb modified  VTE prophylaxis  lovenox 78mg SQ 92mDisposition Disposition deferred at this time, awaiting improvement of current medical problems. Anticipated discharge in approximately 1-2 day(s).     Emergency Contact Contact Information   Name Relation Home Work Mobile   [place] [phone] patient does have a current PCP ([facility] [name].  Signed JacquelynJones Bales1, Internal Medicine Teaching Service [phone] Mon-Fri) 08/19/2013, 9:57 PM

## 2013-08-19 NOTE — ED Notes (Signed)
Attempted report 

## 2013-08-19 NOTE — Progress Notes (Signed)
1830Report  Received from ed 1900Transferred from ED ambulated to bed . Steady gait. [id number] 5.5 l/[place]  No apparent distress

## 2013-08-19 NOTE — ED Notes (Signed)
Respiratory to complete neb tx at 17:30

## 2013-08-19 NOTE — ED Notes (Signed)
Sob this am after feeling tired last night is on home o2 at 4 lnc all the time except when she takes it off to smoke so she ran her o2 up to 5 l  after she got sob states she only smoked 1/2 a cig this am

## 2013-08-20 DIAGNOSIS — F17200 Nicotine dependence, unspecified, uncomplicated: Secondary | ICD-10-CM

## 2013-08-20 DIAGNOSIS — G4733 Obstructive sleep apnea (adult) (pediatric): Secondary | ICD-10-CM

## 2013-08-20 DIAGNOSIS — R7309 Other abnormal glucose: Secondary | ICD-10-CM

## 2013-08-20 DIAGNOSIS — H1089 Other conjunctivitis: Secondary | ICD-10-CM

## 2013-08-20 DIAGNOSIS — I2510 Atherosclerotic heart disease of native coronary artery without angina pectoris: Secondary | ICD-10-CM

## 2013-08-20 DIAGNOSIS — J449 Chronic obstructive pulmonary disease, unspecified: Secondary | ICD-10-CM

## 2013-08-20 DIAGNOSIS — E785 Hyperlipidemia, unspecified: Secondary | ICD-10-CM

## 2013-08-20 DIAGNOSIS — D696 Thrombocytopenia, unspecified: Secondary | ICD-10-CM

## 2013-08-20 DIAGNOSIS — I10 Essential (primary) hypertension: Secondary | ICD-10-CM

## 2013-08-20 DIAGNOSIS — J9620 Acute and chronic respiratory failure, unspecified whether with hypoxia or hypercapnia: Secondary | ICD-10-CM

## 2013-08-20 DIAGNOSIS — K219 Gastro-esophageal reflux disease without esophagitis: Secondary | ICD-10-CM

## 2013-08-20 LAB — BASIC METABOLIC PANEL
BUN: 16 mg/dL (ref 6–23)
CO2: 43 meq/L — AB (ref 19–32)
CREATININE: 0.64 mg/dL (ref 0.50–1.10)
Calcium: 9.4 mg/dL (ref 8.4–10.5)
Chloride: 97 mEq/L (ref 96–112)
GFR calc Af Amer: >90 mL/min (ref >90)
GFR calc non Af Amer: >90 mL/min (ref >90)
GLUCOSE: 109 mg/dL — AB (ref 70–99)
POTASSIUM: 4.6 meq/L (ref 3.7–5.3)
Sodium: 144 mEq/L (ref 137–147)

## 2013-08-20 LAB — CBC WITH DIFFERENTIAL/PLATELET
BASOS ABS: 0 10*3/uL (ref 0.0–0.1)
Basophils Relative: 0 % (ref 0–1)
EOS PCT: 0 % (ref 0–5)
Eosinophils Absolute: 0 10*3/uL (ref 0.0–0.7)
HEMATOCRIT: 43.3 % (ref 36.0–46.0)
HEMOGLOBIN: 13.4 g/dL (ref 12.0–15.0)
LYMPHS PCT: 10 % — AB (ref 12–46)
Lymphs Abs: 0.8 10*3/uL (ref 0.7–4.0)
MCH: 30.2 pg (ref 26.0–34.0)
MCHC: 30.9 g/dL (ref 30.0–36.0)
MCV: 97.5 fL (ref 78.0–100.0)
MONO ABS: 0.7 10*3/uL (ref 0.1–1.0)
MONOS PCT: 9 % (ref 3–12)
Neutro Abs: 6.7 10*3/uL (ref 1.7–7.7)
Neutrophils Relative %: 81 % — ABNORMAL HIGH (ref 43–77)
Platelets: 151 10*3/uL (ref 150–400)
RBC: 4.44 MIL/uL (ref 3.87–5.11)
RDW: 14 % (ref 11.5–15.5)
WBC: 8.3 10*3/uL (ref 4.0–10.5)

## 2013-08-20 LAB — INFLUENZA PANEL BY PCR (TYPE A & B)
H1N1 flu by pcr: NOT DETECTED
Influenza A By PCR: NEGATIVE
Influenza B By PCR: NEGATIVE

## 2013-08-20 LAB — GLUCOSE, CAPILLARY
Glucose-Capillary: 115 mg/dL — ABNORMAL HIGH (ref 70–99)
Glucose-Capillary: 288 mg/dL — ABNORMAL HIGH (ref 70–99)

## 2013-08-20 LAB — TROPONIN I: Troponin I: <0.3 ng/mL (ref <0.30)

## 2013-08-20 LAB — URINALYSIS, ROUTINE W REFLEX MICROSCOPIC
BILIRUBIN URINE: NEGATIVE
GLUCOSE, UA: 100 mg/dL — AB
HGB URINE DIPSTICK: NEGATIVE
KETONES UR: NEGATIVE mg/dL
Leukocytes, UA: NEGATIVE
NITRITE: NEGATIVE
PH: 6.5 (ref 5.0–8.0)
Protein, ur: NEGATIVE mg/dL
SPECIFIC GRAVITY, URINE: 1.015 (ref 1.005–1.030)
Urobilinogen, UA: 1 mg/dL (ref 0.0–1.0)

## 2013-08-20 MED ORDER — PREDNISONE 50 MG PO TABS
50.0000 mg | ORAL_TABLET | Freq: Every day | ORAL | Status: DC
  Administered 2013-08-20: 50 mg via ORAL
  Filled 2013-08-20 (×2): qty 1

## 2013-08-20 MED ORDER — PREDNISONE 20 MG PO TABS
40.0000 mg | ORAL_TABLET | Freq: Every day | ORAL | Status: DC
  Administered 2013-08-21: 40 mg via ORAL
  Filled 2013-08-20 (×2): qty 2

## 2013-08-20 MED ORDER — ALBUTEROL SULFATE (2.5 MG/3ML) 0.083% IN NEBU: 5.0000 mg | INHALATION_SOLUTION | RESPIRATORY_TRACT | Status: AC | PRN

## 2013-08-20 MED ORDER — DM-GUAIFENESIN ER 30-600 MG PO TB12
1.0000 | ORAL_TABLET | Freq: Two times a day (BID) | ORAL | Status: DC | PRN
  Filled 2013-08-20: qty 1

## 2013-08-20 NOTE — Care Management Note (Addendum)
  Page 2 of 2   08/21/2013     3:12:27 PM CARE MANAGEMENT NOTE 08/21/2013  Patient:  [name], [name]   Account Number:  [id number]  Date Initiated:  08/20/2013  Documentation initiated by:  Mariann Laster  Subjective/Objective Assessment:   Admitted with Acute on chronic respiratory failure     Action/Plan:   CM to follow for disposition needs   Anticipated DC Date:  08/23/2013   Anticipated DC Plan:  [place]  CM consult      PAC Choice  DURABLE MEDICAL EQUIPMENT   Choice offered to / List presented to:  C-1 Patient   DME arranged  [facility]           Status of service:  Completed, signed off Medicare Important Message given?  YES (If response is "NO", the following Medicare IM given date fields will be blank) Date Medicare IM given:  08/20/2013 Date Additional Medicare IM given:    Discharge Disposition:  HOME/SELF CARE  Per UR Regulation:  Reviewed for med. necessity/level of care/duration of stay  If discussed at [place] of Stay Meetings, dates discussed:    Comments:  Donald Jacque RN, BSN, MSHL, CCM  Nurse - Case Manager, ([facility] [place])  [phone]  08/21/2013 Social:  From home with family and roommate.   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 Home DME:  walker, w/c, crutches, home O2 Patient states home oxygen currently set up through [facility] who is out of network with [facility] and has been trying to get things switched but has not occurred yet.  States [facility] is not getting paid for this service right now. CM contacted [facility] to confirm oxygen in place and instructed to complete new O2 sats and process with another Walking sats dropped to 88/89 with 4 L [place]  provider.   [facility] will pick up their o2 once new O2 is in place. CM faxed order to [facility]:  [place] phone and [phone] fax CM notified patient via TC post d/c of arrangements and plan.

## 2013-08-20 NOTE — Progress Notes (Signed)
UR completed Trace Wirick K. Shayleen Eppinger, RN, BSN, [place], CCM  08/20/2013 12:02 PM

## 2013-08-20 NOTE — Progress Notes (Addendum)
Subjective:   Pt has no new complaints this AM.  Pt is feeling better and did not have any events overnight. VSS.  Breathing is not yet back to baseline, however, she is back on her home oxygen requirements of 4L [place].  She was able to eat some breakfast.  -460m yesterday?  Weight down 6 lbs since yesterday?   Objective:   Vital signs in last 24 hours: Filed Vitals:   08/20/13 0214 08/20/13 0608 08/20/13 0736 08/20/13 0855  BP: 136/64 121/75  106/56  Pulse: 73 74  71  Temp: 98 F (36.7 C) 98.3 F (36.8 C)  98.6 F (37 C)  TempSrc: Oral Oral  Oral  Resp: _0 Height:      Weight:  240 lb 15.4 oz (109.3 kg)    SpO2: 95% 98% 91% 96%    Weight: Filed Weights   08/19/13 1044 08/19/13 1914 08/20/13 0608  Weight: 246 lb (111.585 kg) 240 lb 8 oz (109.09 kg) 240 lb 15.4 oz (109.3 kg)    Ins/Outs:  Intake/Output Summary (Last 24 hours) at 08/20/13 1309 Last data filed at 08/20/13 1108  Gross per 24 hour  Intake    802 ml  Output   1400 ml  Net   -598 ml    Physical Exam: Constitutional: Vital signs reviewed.  Patient is lying in bed in no acute distress and cooperative with exam.   HEENT: [place]/AT; PERRL, EOMI, conjunctivae injected, no scleral icterus  Cardiovascular: RRR, no MRG Pulmonary/Chest: normal respiratory effort, no accessory muscle use, mild bibasilar crackles improved from yesterday Abdominal: Soft. +BS, NT/ND Neurological: A&O x3, CN II_XII grossly intact; non-focal exam Extremities: 2+DP b/l, no C/C, trace-pitting edema bilaterally improved from yesterday  Skin: Warm, dry and intact. No rash  Lab Results:  BMP:  Recent Labs Lab 08/19/13 1055 08/19/13 1751 08/20/13 0730  NA 142  --  144  K 5.0  --  4.6  CL 99  --  97  CO2 39*  --  43*  GLUCOSE 97  --  109*  BUN 12  --  16  CREATININE 0.71  --  0.64  CALCIUM 9.2  --  9.4  MG  --  1.7  --   PHOS  --  4.0  --    Anion Gap:  4  CBC:  Recent Labs Lab 08/19/13 1055 08/20/13 0730  WBC  5.5 8.3  NEUTROABS 4.0 6.7  HGB 13.2 13.4  HCT 42.8 43.3  MCV 100.0 97.5  PLT 124* 151    Coagulation: No results found for this basename: LABPROT, INR,  in the last 168 hours  CBG:            Recent Labs Lab 08/19/13 2227 08/20/13 0651  GLUCAP 269* 115*           HA1C:       Recent Labs Lab 08/19/13 1751  HGBA1C 5.9*    Lipid Panel: No results found for this basename: CHOL, HDL, LDLCALC, TRIG, CHOLHDL, LDLDIRECT,  in the last 168 hours  LFTs:  Recent Labs Lab 08/19/13 1751  AST 13  ALT 10  ALKPHOS 103  BILITOT 0.5  PROT 6.9  ALBUMIN 3.5    Pancreatic Enzymes: No results found for this basename: LIPASE, AMYLASE,  in the last 168 hours  Lactic Acid/Procalcitonin: No results found for this basename: LATICACIDVEN, PROCALCITON,  in the last 168 hours  Ammonia: No results found for this basename: AMMONIA,  in the last 168 hours  Cardiac Enzymes:  Recent Labs Lab 08/19/13 2020 08/20/13 0110 08/20/13 0730  TROPONINI <0.30 <0.30 <0.30    EKG: EKG Interpretation  Date/Time:  Monday Aug 19 2013 10:42:21 EDT Ventricular Rate:  73 PR Interval:  139 QRS Duration: 93 QT Interval:  360 QTC Calculation: 397 R Axis:   105 Text Interpretation:  Sinus rhythm Consider right atrial enlargement Probable RVH w/ secondary repol abnormality Nonspecific T wave abnormality No significant change since last tracing Confirmed by STEINL  MD, Lennette Bihari ([id number]) on 08/19/2013 10:57:48 AM   BNP:  Recent Labs Lab 08/19/13 1055  PROBNP 1093.0*    D-Dimer: No results found for this basename: DDIMER,  in the last 168 hours  Urinalysis:  Recent Labs Lab 08/20/13 0610  COLORURINE YELLOW  LABSPEC 1.015  PHURINE 6.5  GLUCOSEU 100*  HGBUR NEGATIVE  BILIRUBINUR NEGATIVE  KETONESUR NEGATIVE  PROTEINUR NEGATIVE  UROBILINOGEN 1.0  NITRITE NEGATIVE  LEUKOCYTESUR NEGATIVE    Micro Results: No results found for this or any previous visit (from the past 240  hour(s)).  Blood Culture:    Component Value Date/Time   SDES BLOOD LEFT ARM 07/02/2013 0840   SPECREQUEST BOTTLES DRAWN AEROBIC ONLY 10CC 07/02/2013 0840   CULT  Value: NO GROWTH 5 DAYS Performed at [facility] 07/02/2013 0840   REPTSTATUS 07/08/2013 FINAL 07/02/2013 0840    Studies/Results: Dg Chest 2 View  08/19/2013   CLINICAL DATA:  Shortness of Breath  EXAM: CHEST  2 VIEW  COMPARISON:  July 01, 2013 and May 13, 2013  FINDINGS: Interstitium in the mid and lower lung zones is thickened, a chronic finding. There is a minimal right effusion. There is cardiomegaly with pulmonary venous hypertension. There is no airspace consolidation. There are multiple surgical clips in the left upper abdomen. There is no appreciable adenopathy. There is degenerative change in the thoracic spine.  IMPRESSION: Findings indicative a degree of chronic congestive heart failure. No appreciable airspace consolidation.   Electronically Signed   By: Lowella Grip M.D.   On: 08/19/2013 11:30    Medications:  Scheduled Meds: . aspirin  81 mg Oral Daily  . benzonatate  100 mg Oral TID  . enoxaparin (LOVENOX) injection  40 mg Subcutaneous Q24H  . furosemide  60 mg Intravenous Daily  . ipratropium-albuterol  3 mL Nebulization Q4H  . nicotine  21 mg Transdermal Daily  . predniSONE  50 mg Oral Q breakfast  . sodium chloride  3 mL Intravenous Q12H  . sodium chloride  3 mL Intravenous Q12H   Continuous Infusions:  PRN Meds: sodium chloride, acetaminophen, polyvinyl alcohol, sodium chloride  Antibiotics: Antibiotics Given (last 72 hours)   None      Day of Hospitalization: 1  Consults:  None  Assessment/Plan:   Principal Problem:   Acute on chronic respiratory failure Active Problems:   TOBACCO ABUSE   PULMONARY HYPERTENSION, SECONDARY   CHRONIC OBSTRUCTIVE PULMONARY DISEASE   SLEEP APNEA   Acute on chronic diastolic heart failure  Acute on chronic hypoxemic and hypercapnic  respiratory failure  Pt presented with SOB, cough, and wheezing since last PM. She reports her breathing is better this AM but not back to baseline.  She still has mild bibasilar crackles but without wheezing.  She is on her home 02 requirement of 4L [place]. Most likely c/w acute diastolic CHF exacerbation given CXR findings, questionable compliance with lasix, and elevated BNP (1093) from 167 in February 2015.  -continue  lasix 72m IV qd  -continue duonebs q4 h -albuterol q2h PRN  -add prednisone 421mdaily x 5 days  -O2 [place] to keep O2sat>92%  -provide smoking cessation education  -continuous pulse oximetry  -ambulate patient with pulse ox -ABG if decompensates  -consult to respiratory care  -strict Is/Os and daily weights   Acute on chronic diastolic heart failure -per above  COPD  -per above   Hypertension  Stable.  -continue home meds   CAD  Cardiac cath on 11/05/12 showed moderate PAH. 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 On ASA 8156m-Continue ASA 52m89md statin   Allergic conjuctivitis  -lubricating eye drops   h/o Prediabetes  Last HA1C 5.6 which does not fall in the category of prediabetes.  Lab Results   Component  Value  Date    HGBA1C  5.6  04/24/2013    h/o GERD  Not on any meds at home.  -monitor   Dyslipidemia  Pt is on lovastatin 20mg24mhome. Bilateral xanthelasma.  Lab Results   Component  Value  Date    LDLCALC  136*  11/09/2012   -continue statin therapy   Mild chronic thrombocytopenia  Platelets on admission 124 without symptoms/signs of bleeding.  -monitor for bleeding  -monitor cbc   Obstructive sleep apnea  -CPAP   Substance abuse  Pt has a h/o tobacco use. She is still smoking 1 ppd.  -nicotine patch 21mg 62my  -CSW consult   FEN  Fluids-None  Electrolytes-  Nutrition- Carb modified   VTE prophylaxis  lovenox 40mg S48m   Disposition Disposition is deferred, awaiting improvement of current medical  problems.  Anticipated discharge in approximately 1-2 day(s).     LOS: 1 day   JacquelJones BalesY-1, Internal Medicine Teaching Service [phone] Mon-Fri) 08/20/2013, 1:09 PM

## 2013-08-20 NOTE — H&P (Signed)
INTERNAL MEDICINE TEACHING ATTENDING NOTE  Day 1 of stay  Patient name: [name]  MRN: [id number] Date of birth: May 28, 1955   58 y.o. lady with CAD, COPD and diastolic dysfunction admitted with shortness of breath, assessed to be largely due to fluid overload. The patient has been diuresed and has put out >800 ml of urine since yesterday (from the chart).   Today, she feels subjectively better. Her lung exam is significant for no rales, rare occassional wheeze. Trace pedal edema. She has been decreased to 4l [place] at this time and breathing comfortably - saturating 96%.  Imaging and labs reviewed.   Marland Kitchen aspirin  81 mg Oral Daily  . benzonatate  100 mg Oral TID  . enoxaparin (LOVENOX) injection  40 mg Subcutaneous Q24H  . furosemide  60 mg Intravenous Daily  . ipratropium-albuterol  3 mL Nebulization Q4H  . nicotine  21 mg Transdermal Daily  . sodium chloride  3 mL Intravenous Q12H  . sodium chloride  3 mL Intravenous Q12H      Assessment and Plan  CHF exacerbation - Would continue lasix 60 mg daily. Patient seems to be clinically improving. Weight change from 246 to 240 lb overnight.  Component of COPD - I would give a quick course of 25m prednisone for 5 days.    I have seen and evaluated this patient and discussed it with my IM resident team.  Please see the rest of the plan per resident note from today.   Averee Harb 08/20/2013, 11:52 AM.

## 2013-08-20 NOTE — Progress Notes (Signed)
Patient is stable. VSS.  Complained of headache, gave PRN tylenol.  Pain was reduced.  No other complaints.

## 2013-08-20 NOTE — ED Provider Notes (Signed)
Medical screening examination/treatment/procedure(s) were conducted as a shared visit with non-physician practitioner(s) and myself.  I personally evaluated the patient during the encounter.   EKG Interpretation   Date/Time:  Monday Aug 19 2013 10:42:21 EDT Ventricular Rate:  73 PR Interval:  139 QRS Duration: 93 QT Interval:  360 QTC Calculation: 397 R Axis:   105 Text Interpretation:  Sinus rhythm Consider right atrial enlargement  Probable RVH w/ secondary repol abnormality Nonspecific T wave abnormality  No significant change since last tracing Confirmed by Cariann Kinnamon  MD, Lennette Bihari  ([id number]) on 08/19/2013 10:57:48 AM      Pt c/o wheezing, sob, hx copd. No fevers. Non prod cough. Wheezing bil. Improved mildly w nebs. Cxr.   Mirna Mires, MD 08/20/13 [phone]

## 2013-08-21 DIAGNOSIS — I5033 Acute on chronic diastolic (congestive) heart failure: Principal | ICD-10-CM

## 2013-08-21 DIAGNOSIS — H1045 Other chronic allergic conjunctivitis: Secondary | ICD-10-CM

## 2013-08-21 LAB — BASIC METABOLIC PANEL
BUN: 21 mg/dL (ref 6–23)
CHLORIDE: 94 meq/L — AB (ref 96–112)
CO2: 42 meq/L — AB (ref 19–32)
Calcium: 9.8 mg/dL (ref 8.4–10.5)
Creatinine, Ser: 0.73 mg/dL (ref 0.50–1.10)
GFR calc Af Amer: >90 mL/min (ref >90)
GFR calc non Af Amer: >90 mL/min (ref >90)
GLUCOSE: 105 mg/dL — AB (ref 70–99)
POTASSIUM: 4.6 meq/L (ref 3.7–5.3)
SODIUM: 143 meq/L (ref 137–147)

## 2013-08-21 LAB — GLUCOSE, CAPILLARY
GLUCOSE-CAPILLARY: 104 mg/dL — AB (ref 70–99)
Glucose-Capillary: 93 mg/dL (ref 70–99)

## 2013-08-21 MED ORDER — FUROSEMIDE 40 MG PO TABS
60.0000 mg | ORAL_TABLET | Freq: Every day | ORAL | Status: DC
  Administered 2013-08-21: 60 mg via ORAL
  Filled 2013-08-21: qty 1

## 2013-08-21 MED ORDER — BIOTENE DRY MOUTH MT LIQD
15.0000 mL | Freq: Two times a day (BID) | OROMUCOSAL | Status: DC
  Administered 2013-08-21: 15 mL via OROMUCOSAL

## 2013-08-21 MED ORDER — IPRATROPIUM-ALBUTEROL 0.5-2.5 (3) MG/3ML IN SOLN
3.0000 mL | Freq: Three times a day (TID) | RESPIRATORY_TRACT | Status: DC
  Administered 2013-08-21: 3 mL via RESPIRATORY_TRACT
  Filled 2013-08-21 (×2): qty 3

## 2013-08-21 MED ORDER — PREDNISONE 20 MG PO TABS: 40.0000 mg | ORAL_TABLET | Freq: Every day | ORAL | Status: DC

## 2013-08-21 MED ORDER — CHLORHEXIDINE GLUCONATE 0.12 % MT SOLN
15.0000 mL | Freq: Two times a day (BID) | OROMUCOSAL | Status: DC
  Administered 2013-08-21: 15 mL via OROMUCOSAL
  Filled 2013-08-21 (×3): qty 15

## 2013-08-21 MED ORDER — FUROSEMIDE 20 MG PO TABS: 60.0000 mg | ORAL_TABLET | Freq: Every day | ORAL | Status: DC

## 2013-08-21 NOTE — Discharge Summary (Signed)
Name: [name] MRN: [id number] DOB: [birth year] 58 y.o. PCP: Jessee Avers, MD _____________________________________________________  Date of Admission: 08/19/2013 10:38 AM Date of Discharge: 08/21/2013 Attending Physician: Madilyn Fireman, MD   Discharge Diagnosis: Acute on chronic hypoxemic and hypercarbic respiratory failure Acute on chronic diastolic HF CAD COPD Pulmonary hypertension Prediabetes Hypertension Dyslipidemia Obstructive sleep apnea Chronic mild thrombocyopenia Substance abuse  Discharge Medications:   Medication List         albuterol 1.25 MG/3ML nebulizer solution  Commonly known as:  ACCUNEB  Take 1 ampule by nebulization every 6 (six) hours as needed for wheezing.     albuterol 108 (90 BASE) MCG/ACT inhaler  Commonly known as:  PROVENTIL HFA;VENTOLIN HFA  Inhale 1 puff into the lungs every 4 (four) hours as needed for wheezing or shortness of breath.     aspirin 81 MG chewable tablet  Chew 1 tablet (81 mg total) by mouth daily.     Fluticasone-Salmeterol 500-50 MCG/DOSE Aepb  Commonly known as:  ADVAIR  Inhale 1 puff into the lungs 2 (two) times daily.     furosemide 20 MG tablet  Commonly known as:  LASIX  Take 3 tablets (60 mg total) by mouth daily.     lovastatin 20 MG tablet  Commonly known as:  MEVACOR  Take 1 tablet (20 mg total) by mouth at bedtime.     Olopatadine HCl 0.2 % Soln  Apply 1 drop to eye 2 (two) times daily.     predniSONE 20 MG tablet  Commonly known as:  DELTASONE  Take 2 tablets (40 mg total) by mouth daily with breakfast.     tiotropium 18 MCG inhalation capsule  Commonly known as:  SPIRIVA HANDIHALER  Place 1 capsule (18 mcg total) into inhaler and inhale daily.        Disposition and follow-up:   [name] was discharged from [facility] in stable condition to home.  She will be followed by [facility] services going forward.   Please address the following problems post  discharge:  1.Compliance with lasix and low-sodium diet.  2. Reassess oxygen requirements, on 5L home O2.  3.   Labs / imaging needed at time of follow-up: BMP  Pending labs/ test needing follow-up: None  Follow-up Appointments: Follow-up Information   Follow up with Elnora Morrison, MD On 08/29/2013. (3:00PM)    Specialty:  Internal Medicine   Contact information:   [place] [id number] [phone]       Discharge Instructions: Discharge Instructions   ([place]) Call MD:  Anytime you have any of the following symptoms: 1) 3 pound weight gain in 24 hours or 5 pounds in 1 week 2) shortness of breath, with or without a dry hacking cough 3) swelling in the hands, feet or stomach 4) if you have to sleep on extra pillows at night in order to breathe.    Complete by:  As directed      Call MD for:  extreme fatigue    Complete by:  As directed      Call MD for:  persistant dizziness or light-headedness    Complete by:  As directed      Diet - low sodium heart healthy    Complete by:  As directed      Increase activity slowly    Complete by:  As directed            Consultations:  None  Procedures Performed:  Dg Chest 2 View  08/19/2013   CLINICAL DATA:  Shortness of Breath  EXAM: CHEST  2 VIEW  COMPARISON:  July 01, 2013 and May 13, 2013  FINDINGS: Interstitium in the mid and lower lung zones is thickened, a chronic finding. There is a minimal right effusion. There is cardiomegaly with pulmonary venous hypertension. There is no airspace consolidation. There are multiple surgical clips in the left upper abdomen. There is no appreciable adenopathy. There is degenerative change in the thoracic spine.  IMPRESSION: Findings indicative a degree of chronic congestive heart failure. No appreciable airspace consolidation.   Electronically Signed   By: Lowella Grip M.D.   On: 08/19/2013 11:30    2D Echo: N/A  Cardiac Cath: N/A  Admission HPI:    [name] is a 58 y.o. female with a PMH of CAD, COPD (on 4L continuous home O2) with pulmonary hypertension, diastolic dysfunction, right pulmonary nodule, OSA, dyslipidemia, and GERD. Pt presents to the ED with SOB, cough (with clear sputum production), and wheezing that has worsened since last night. She states that she was sitting in her chair yesterday evening when she suddenly became short of breath. She denies any CP, palpitations, N/V/D/C, abdominal pain, or dysuria. She does report some chills (no fever), PND and LE swelling, but denies any orthopnea. She denies any long distance travel or any sick contacts. No history of blood clots. She has never been intubated and was on prednisone and antibiotics in April 2015. She reports compliance with her lasix 51m daily and with her inhalers. She denies missing any doses besides today. She does report consuming more sodium recently and had a sausage biscuit yesterday. She has been using her rescue inhaler more frequently yesterday about 2-3 times which is unusual for her. She is still smoking about 1 ppd of cigarettes but would like to quit. She has not seen a pulmonologist in 4 years since she was diagnosed with COPD. She does not weight herself daily but reports she has gained weight recently. According to chart review, she was 235 lbs on 3/31 and increased to 246 on 5/6, and is now 240.  Of note, she was last discharged on 07/03/13 with acute respiratory failure secondary to COPD/CHF. Pt has been admitted 3 times in the past 6 months for similar issues.  ED course: In the ED, pt was given duonebs x2, albuterol/atrovent, 1242msolumedrol, and 2048mV lasix. When we saw her she was saturating 94% on 6L [place]. She does not wish to be intubated but is agreeable to CPR.   Hospital Course by problem list: Principal Problem:   Acute on chronic respiratory failure Active Problems:   TOBACCO ABUSE   PULMONARY HYPERTENSION, SECONDARY   CHRONIC OBSTRUCTIVE  PULMONARY DISEASE   SLEEP APNEA   Acute on chronic diastolic heart failure   Acute on chronic hypoxemic and hypercapnic respiratory failure  Pt presented with SOB, cough, and wheezing.  She is without wheezing on our exam but has bibasilar crackles. SpO2 94 on 6L . 11/07/12 TTE: LVEF 55-60% with LA/RA dilation, with increased PAP 16m18m. 5/18 CXR shows chronic CHF with no appreciable airspace consolidation. There is cardiomegaly with pulmonary venous hypertension with minimal right effusion. Most likely c/w acute diastolic CHF exacerbation given CXR findings, questionable compliance with lasix, and elevated BNP (1093) from 167 in February 2015. AECOPD is also possible but unlikely as pt is without wheezing on exam. OSA, pulmonary HTN, and obesity hypoventilation syndrome likely contributing  also. PE is unlikely given low Wells score and would not be expected to cause hypercapnic failure. Trops x 3 negative. PNA unlikely given afebrile without leukocytosis and negative CXR.   Most likely c/w acute diastolic CHF exacerbation given CXR findings, questionable compliance with lasix, and elevated BNP (1093) from 167 in February 2015.  She was started on 10m lasix IV followed by resumption of her home dose of 427mlasix po.  Also given duobnebs every 4 hours and albuterol as needed every 2 hours.  She was discharged on her home O2 requirement of 4L but was 88% on 4L while ambulating.  I phoned her and requested she increase her O2 to 5L [place] and [facility].    Acute on chronic diastolic heart failure  Per above.  COPD  Per above.  Hypertension  Stable.  Continued home meds,   CAD  Cardiac cath on 11/05/12 showed moderate PAH. 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 On ASA 8111mContinued ASA 68m46md statin.  Allergic conjuctivitis  Provided lubricating eye drops.  h/o Prediabetes  Last HA1C 5.6 which does not fall in the  category of prediabetes.  Lab Results   Component  Value  Date    HGBA1C  5.6  04/24/2013    h/o GERD  Not on any meds at home.   Dyslipidemia  Pt is on lovastatin 20mg11mhome. Bilateral xanthelasma.  Lab Results   Component  Value  Date    LDLCALC  136*  11/09/2012    Mild chronic thrombocytopenia  Platelets on admission 124 without symptoms/signs of bleeding.   Obstructive sleep apnea  CPAP provided.   Substance abuse  Pt has a h/o tobacco use. She is still smoking 1 ppd. Provided nicotine patch 21mg 15my.  CSW consulted.   Discharge Vitals:   BP 133/77  Pulse 67  Temp(Src) 98.3 F (36.8 C) (Oral)  Resp 18  Ht _0  (1.651 m)  Wt 236 lb 8.9 oz (107.3 kg)  BMI 39.36 kg/m2  SpO2 96%  Discharge Labs:  Results for orders placed during the hospital encounter of 08/19/13 (from the past 24 hour(s))  GLUCOSE, CAPILLARY     Status: Abnormal   Collection Time    08/20/13  6:11 PM      Result Value Ref Range   Glucose-Capillary 288 (*) 70 - 99 mg/dL  BASIC METABOLIC PANEL     Status: Abnormal   Collection Time    08/21/13  5:00 AM      Result Value Ref Range   Sodium 143  137 - 147 mEq/L   Potassium 4.6  3.7 - 5.3 mEq/L   Chloride 94 (*) 96 - 112 mEq/L   CO2 42 (*) 19 - 32 mEq/L   Glucose, Bld 105 (*) 70 - 99 mg/dL   BUN 21  6 - 23 mg/dL   Creatinine, Ser 0.73  0.50 - 1.10 mg/dL   Calcium 9.8  8.4 - 10.5 mg/dL   GFR calc non Af Amer >90  >90 mL/min   GFR calc Af Amer >90  >90 mL/min  GLUCOSE, CAPILLARY     Status: Abnormal   Collection Time    08/21/13  6:07 AM      Result Value Ref Range   Glucose-Capillary 104 (*) 70 - 99 mg/dL    Signed: JacqueJones [id number], 11:32 AM   Time Spent on  Discharge: 80mnutes Services Ordered on Discharge: None Equipment Ordered on Discharge: Home oxygen.

## 2013-08-21 NOTE — Progress Notes (Signed)
Patient evaluated for community based chronic disease management services with [facility] as a benefit of patient's [employer]. Spoke with patient at bedside to explain [facility] services. Services have been accepted with written consent.  She has no authorized contacts at this time.  She is changing her oxygen service to [employer] from [facility].  Contact number is [phone].  Patient will receive a post discharge transition of care call and will be evaluated for monthly home visits for assessments and disease process education.  Left contact information and [facility] literature at bedside. Made Inpatient Case Manager aware that [place] [facility] following. Of note, [facility] Care Management services does not replace or interfere with any services that are arranged by inpatient case management or social work.  For additional questions or referrals please contact Corliss Blacker BSN RN [facility] Liaison at [phone].

## 2013-08-21 NOTE — Discharge Instructions (Signed)
Please keep your follow-up appointments; this is very important for your continued recovery.    We have made the following changes to your medications: increased your lasix (furosemide) to 75m (3 tablets) daily.   Please continue to take all of your medications as prescribed.  Do not miss any doses without contacting your primary physician.  If you have questions, please contact your physician.    Please bring your medicications with you to your appointments; medicications may be eye drops, herbals, vitamins, or pills.    If you believe you are having a life-threatening emergency, go to the nearest Emergency Department.      CLiz ClaiborneGuide  1) Find a DCharity fundraiserAlthough you won't have to find out who is covered by yFPL [place], it is a good idea to ask around and get recommendations. You will then need to call the office and see if the doctor you have chosen will accept you as a new patient and what types of options they offer for patients who are self-pay. Some doctors offer discounts or will set up payment plans for their patients who do not have insurance, but you will need to ask so you aren't surprised when you get to your appointment.  2) Contact Your Local Health Department Not all health departments have doctors that can see patients for sick visits, but many do, so it is worth a call to see if yours does. If you don't know where your local health department is, you can check in your phone book. The CDC also has a tool to help you locate your state's health department, and many state websites also have listings of all of their local health departments.  3) Find a WAlfarata ClinicIf your illness is not likely to be very severe or complicated, you may want to try a walk in clinic. These are popping up all over the country in pharmacies, drugstores, and shopping centers. They're usually staffed by nurse practitioners or physician assistants that have been  trained to treat common illnesses and complaints. They're usually fairly quick and inexpensive. However, if you have serious medical issues or chronic medical problems, these are probably not your best option.  No Primary Care Doctor: - Call Health Connect at  [phone]- they can help you locate a primary care doctor that  accepts your insurance, provides certain services, etc. - Physician Referral Service- [phone] Chronic Pain Problems: Organization         Address  Phone   Notes  [facility] [phone] need to be referred by their primary care doctor.   Medication Assistance: Organization         Address  Phone   Notes  [facility] HospitalMedication [facility]1[place] [place], [place] [id number]([phone]--Must be a resident of [facility]-- Must have NO insurance coverage whatsoever (no Medicaid/ Medicare, etc.) -- The pt. MUST have a primary care doctor that directs their care regularly and follows them in the community   MedAssist  [phone]  [employer] ([phone]   Agencies that provide inexpensive medical care: Organization         Address  Phone   Notes  M[place] ([phone]  MZ[facility] PontesInternal Medicine    [phone]  [facility] [place] [address]([phone]  [facility] [address] [place] [phone]   Planned Parenthood    [phone]   [facility]    [phone]   [place] and [address], [place] Phone:  [phone], Fax:  [phone] Hours of Operation:  9 am - 6 pm, M-F.  Also accepts Medicaid/Medicare and self-pay.  [facility] for [place], [address], [place] Phone: [phone], Fax: [phone]. Hours of Operation:  8:30 am - 5:30 pm, M-F.  Also accepts Medicaid and self-pay.   [place] [address], [place] Phone: [phone]   [place], [place], [place] [phone], Ext. [phone] Mondays & Thursdays: 7-9 AM.  First 15 patients are seen on a first come, first serve basis.    [place] Providers:  Organization         Address  Phone   Notes  [facility] [address], [place] [phone] Also accepts self-pay patients.  [facility] [address], [place]  [phone]   [place], [address], [place] [phone]   [facility] [address] [place] [phone]   Lucianne Lei [address], [place]   [phone] Only accepts [place] Access [place] patients after they have their name applied to their card.   Self-Pay (no insurance) in [facility]:  Organization         Address  Phone   Notes  Sickle Cell Patients, [facility] [place] [phone]   [facility] [place] [phone]   [facility] [place]  [place], [place], [place] [phone]   [facility]/Dr. Osei-Bonsu  [address], [place] or [address], [place] [phone] Phone number for both [place] and [place] locations is the same.  Urgent Medical and [facility] [address] [place] [phone]   [facility] [address], [place] or [address] [phone]   [facility] [address], [place] [phone], phone; [phone], fax Sees patients 1st and 3rd Saturday of every month.  Must not qualify for public or private insurance (i.e. Medicaid, Medicare, [place] Health Choice, Veterans' Benefits)  Household income should be no  more than 200% of the poverty level The clinic cannot treat you if you are pregnant or think you are pregnant  Sexually transmitted diseases are not treated at the clinic.    Dental Care: Organization         Address  Phone  Notes  [facility] Department of [facility] [phone] Accepts children up to age 58 who are enrolled in [place] or [place]; pregnant women with a Medicaid card; and children who have applied for Medicaid or  Health Choice, but were declined, whose parents can pay a reduced fee at time of service.  [facility] Department of [facility]  [address], [place] [phone] Accepts children up to age 85 who are enrolled in [place] or [place]; pregnant women with a Medicaid card; and  children who have applied for Medicaid or [place] Health Choice, but were declined, whose parents can pay a reduced fee at time of service.  [place] Adult Dental Access PROGRAM  [place] [phone] Patients are seen by appointment only. Walk-ins are not accepted. [place] will see patients 90 years of age and older. Monday - Tuesday (8am-5pm) Most Wednesdays (8:30-5pm) $30 per visit, cash only  [facility] Adult Dental Access PROGRAM  [address], [facility] [phone] Patients are seen by appointment only. Walk-ins are not accepted. [place] will see patients 15 years of age and older. One Wednesday Evening (Monthly: Volunteer Based).  $30 per visit, cash only  [place]  [phone] for adults; Children under age 46, call Graduate Pediatric Dentistry at [phone]. Children aged 9-14, please call [phone] to request a pediatric application.  Dental services are provided in all areas of dental care including fillings, crowns and bridges, complete and partial dentures, implants, gum treatment, root canals,  and extractions. Preventive care is also provided. Treatment is provided to both adults and children. Patients are selected via a lottery and there is often a waiting list.   [facility] [address] [place]  [phone] [email or web address]   [facility] [address] [place], [place] [phone], Ext. [phone] Second and Fourth Thursday of each month, opens at 6:30 AM; Clinic ends at 9 AM.  Patients are seen on a first-come first-served basis, and a limited number are seen during each clinic.   [facility]  [address] Hillard Danker [place], [place] [phone]   Eligibility Requirements You must have lived in [place], [place], or [place] counties for at least the last three months.   You cannot be eligible for state or federal sponsored [employer], including [employer], [place], or [employer].   You generally cannot be eligible for healthcare insurance through your employer.    How to apply: Eligibility screenings are held every Tuesday and Wednesday afternoon from 1:00 pm until 4:00 pm. You do not need an appointment for the interview!  [facility] [address] [place], [place]   [place]  [place] Department  [place]  [phone]    Behavioral Health Resources in the Community: Intensive Outpatient Programs Organization         Address  Phone  Notes  [place]. [address] [place], [place] [phone]   [facility]patient [address] [place], [place]   ADS: Alcohol & Drug Svcs [address] [place], [place]   [place] [address],  [place], [place] or [phone]   Substance Abuse Resources Organization         Address  Phone  Notes  Alcohol and Drug Services  [phone]    [place]  [phone]   The [place]   Chinita Pester  [phone]   Residential & Outpatient Substance Abuse Program  [phone]   Psychological Services Organization         Address  Phone  Notes  [facility]  [place]  [phone]   [place] [address], [place] or [phone]    Mobile Crisis Teams Organization  Address  Phone  Notes  [facility]atives, Mobile Crisis Care Unit  [phone]   Assertive Psychotherapeutic Services  [address] [place], [place]   [facility] [place] [address] [place] [phone]    Self-Help/Support Groups Organization         Address  Phone             Notes  Joy. of [place] - variety of support groups  [place] Call for more information  Narcotics Anonymous (NA), Caring Services [address], [employer] [place]  2 meetings at this location   Special educational needs teacher         Address  Phone  Notes  [facility] Residential Treatment [place],    [place]  [phone]   [facility]  [address], [place] [address], [place], [place]   [place], [place] [phone] Admissions: 8am-3pm M-F  Incentives Substance [place] [address],    [place], [place] [phone]   [facility] [address] [place], [place], [place]   [facility] [address]  [place], [place]   Insight Programs - Intensive Outpatient [address], [place], [place]   [facility] ([place].) [place].,  [place], [place] [phone] or [phone]   Residential Treatment Services (RTS) [address], [place], [place] Accepts Medicaid  [facility] [place] [address]    [place] [phone] Substance Abuse/Addiction Treatment   [facility] Organization         Address  Phone  Notes  [facility]  [phone]   Domenic Schwab, PhD [address] Arlis Porta [place], [place]   [phone] or [phone]   [place], [place] [phone]   [facility] [address], [place], [place] [phone] Insurance/Medicaid/sponsorship through [facility] and Families [address]                                    [place], [place] [phone] [place] [address], [place] [phone]    Dr. Adele Schilder  [phone]   [facility] [place] [facility] Dept. 1) [address] [place] 2) [place] 3)  [address], [place] [phone]  [phone]   [place] [phone] or [phone] (After Hours)

## 2013-08-21 NOTE — Progress Notes (Addendum)
3:19 PM  Phoned Ms. Ellender to request that she increase her oxygen to 5L [place] because her SpO2 was dropping to 88-89% on 4L with ambulation.  [facility] and can reassess her oxygen requirements at that time.    Jones Bales, MD

## 2013-08-21 NOTE — Progress Notes (Signed)
Subjective:   VSS.  Weight down 246-->236 lb; down 1.0L since admission.  She is 96% on 4L which is her home 02 requirement.  She feels she is back to her baseline in terms of breathing.    Objective:   Vital signs in last 24 hours: Filed Vitals:   08/20/13 2012 08/20/13 2321 08/21/13 0035 08/21/13 0610  BP:  120/56  133/77  Pulse:  66  67  Temp:  98 F (36.7 C)  98.3 F (36.8 C)  TempSrc:  Oral  Oral  Resp:  16  18  Height:      Weight:    236 lb 8.9 oz (107.3 kg)  SpO2: 96% 94% 94% 96%    Weight: Filed Weights   08/19/13 1914 08/20/13 0608 08/21/13 0610  Weight: 240 lb 8 oz (109.09 kg) 240 lb 15.4 oz (109.3 kg) 236 lb 8.9 oz (107.3 kg)    Ins/Outs:  Intake/Output Summary (Last 24 hours) at 08/21/13 1140 Last data filed at 08/21/13 0700  Gross per 24 hour  Intake    702 ml  Output   1150 ml  Net   -448 ml    Physical Exam: Constitutional: Vital signs reviewed.  Patient is lying in bed in no acute distress and cooperative with exam.   HEENT: /AT; PERRL, EOMI, conjunctivae injected, no scleral icterus  Cardiovascular: RRR, no MRG Pulmonary/Chest: normal respiratory effort, no accessory muscle use, CTAB Abdominal: Soft. +BS, NT/ND Neurological: A&O x3, CN II-XII grossly intact; non-focal exam Extremities: 2+DP b/l, no c/c/e Skin: Warm, dry and intact. No rash  Lab Results:  BMP:  Recent Labs Lab 08/19/13 1751 08/20/13 0730 08/21/13 0500  NA  --  144 143  K  --  4.6 4.6  CL  --  97 94*  CO2  --  43* 42*  GLUCOSE  --  109* 105*  BUN  --  16 21  CREATININE  --  0.64 0.73  CALCIUM  --  9.4 9.8  MG 1.7  --   --   PHOS 4.0  --   --    Anion Gap:    CBC:  Recent Labs Lab 08/19/13 1055 08/20/13 0730  WBC 5.5 8.3  NEUTROABS 4.0 6.7  HGB 13.2 13.4  HCT 42.8 43.3  MCV 100.0 97.5  PLT 124* 151    Coagulation: No results found for this basename: LABPROT, INR,  in the last 168 hours  CBG:            Recent Labs Lab 08/19/13 2227  08/20/13 0651 08/20/13 1811 08/21/13 0607  GLUCAP 269* 115* 288* 104*           HA1C:       Recent Labs Lab 08/19/13 1751  HGBA1C 5.9*    Lipid Panel: No results found for this basename: CHOL, HDL, LDLCALC, TRIG, CHOLHDL, LDLDIRECT,  in the last 168 hours  LFTs:  Recent Labs Lab 08/19/13 1751  AST 13  ALT 10  ALKPHOS 103  BILITOT 0.5  PROT 6.9  ALBUMIN 3.5    Pancreatic Enzymes: No results found for this basename: LIPASE, AMYLASE,  in the last 168 hours  Lactic Acid/Procalcitonin: No results found for this basename: LATICACIDVEN, PROCALCITON,  in the last 168 hours  Ammonia: No results found for this basename: AMMONIA,  in the last 168 hours  Cardiac Enzymes:  Recent Labs Lab 08/19/13 2020 08/20/13 0110 08/20/13 0730  TROPONINI <0.30 <0.30 <0.30    EKG: EKG Interpretation  Date/Time:  Monday Aug 19 2013 10:42:21 EDT Ventricular Rate:  73 PR Interval:  139 QRS Duration: 93 QT Interval:  360 QTC Calculation: 397 R Axis:   105 Text Interpretation:  Sinus rhythm Consider right atrial enlargement Probable RVH w/ secondary repol abnormality Nonspecific T wave abnormality No significant change since last tracing Confirmed by STEINL  MD, Lennette Bihari ([id number]) on 08/19/2013 10:57:48 AM   BNP:  Recent Labs Lab 08/19/13 1055  PROBNP 1093.0*    D-Dimer: No results found for this basename: DDIMER,  in the last 168 hours  Urinalysis:  Recent Labs Lab 08/20/13 0610  [place] 1.015  PHURINE 6.5  GLUCOSEU 100*  [place] 1.0  NITRITE NEGATIVE  LEUKOCYTESUR NEGATIVE    Micro Results: Recent Results (from the past 240 hour(s))  CULTURE, BLOOD (ROUTINE X 2)     Status: None   Collection Time    08/19/13  8:20 PM      Result Value Ref Range Status   Specimen Description BLOOD LEFT ARM   Final   Special Requests     Final   Value: BOTTLES DRAWN  AEROBIC AND ANAEROBIC BLUE 10CC RED 8CC   Culture  Setup Time     Final   Value: 08/20/2013 00:41     Performed at [facility]   Culture     Final   Value:        BLOOD CULTURE RECEIVED NO GROWTH TO DATE CULTURE WILL BE HELD FOR 5 DAYS BEFORE ISSUING A FINAL NEGATIVE REPORT     Performed at [facility]   Report Status PENDING   Incomplete    Blood Culture:    Component Value Date/Time   SDES BLOOD LEFT ARM 08/19/2013 2020   [place] 10CC RED [place] 08/19/2013 2020   CULT  Value:        BLOOD CULTURE RECEIVED NO GROWTH TO DATE CULTURE WILL BE HELD FOR 5 DAYS BEFORE ISSUING A FINAL NEGATIVE REPORT Performed at [facility] 08/19/2013 2020   REPTSTATUS PENDING 08/19/2013 2020    Studies/Results: No results found.  Medications:  Scheduled Meds: . antiseptic oral rinse  15 mL Mouth Rinse q12n4p  . aspirin  81 mg Oral Daily  . chlorhexidine  15 mL Mouth Rinse BID  . enoxaparin (LOVENOX) injection  40 mg Subcutaneous Q24H  . furosemide  60 mg Oral Daily  . ipratropium-albuterol  3 mL Nebulization TID  . nicotine  21 mg Transdermal Daily  . predniSONE  40 mg Oral Q breakfast  . sodium chloride  3 mL Intravenous Q12H  . sodium chloride  3 mL Intravenous Q12H   Continuous Infusions:  PRN Meds: sodium chloride, acetaminophen, dextromethorphan-guaiFENesin, polyvinyl alcohol, sodium chloride  Antibiotics: Antibiotics Given (last 72 hours)   None      Day of Hospitalization: 2  Consults:  None  Assessment/Plan:   Principal Problem:   Acute on chronic respiratory failure Active Problems:   TOBACCO ABUSE   PULMONARY HYPERTENSION, SECONDARY   CHRONIC OBSTRUCTIVE PULMONARY DISEASE   SLEEP APNEA   Acute on chronic diastolic heart failure  Acute on chronic hypoxemic and hypercapnic respiratory failure  Resolved.  Pt is back to her baseline in terms of breathing.  Likely due to acute on chronic diastolic HF.  She  has no other complaints and can be discharged today.  Pt ambulated on 4L  and O2 desaturated to 88-89%.   -d/c lasix 55m IV qd  -change home dose to 637mlasix po -continue prednisone 4018maily for 3 more days  Acute on chronic diastolic heart failure -per above  COPD  -per above   Hypertension  Stable.  -continue home meds   CAD  Cardiac cath on 11/05/12 showed moderate PAH. 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 On ASA 7m66mContinue ASA 7mg20m statin   Allergic conjuctivitis  -lubricating eye drops   h/o Prediabetes  Last HA1C 5.6 which does not fall in the category of prediabetes.  Lab Results   Component  Value  Date    HGBA1C  5.6  04/24/2013    h/o GERD  Not on any meds at home.  -monitor   Dyslipidemia  Pt is on lovastatin 20mg 70mome. Bilateral xanthelasma.  Lab Results   Component  Value  Date    LDLCALC  136*  11/09/2012   -would increase statin dose at next OV  Mild chronic thrombocytopenia  Platelets on admission 124 without symptoms/signs of bleeding.  -monitor for bleeding   Obstructive sleep apnea  -CPAP   Substance abuse  Pt has a h/o tobacco use. She is still smoking 1 ppd.  -nicotine patch 21mg d58m  -CSW consult   FEN  Fluids-None  Electrolytes-  Nutrition- Carb modified   VTE prophylaxis  lovenox 40mg SQ66m  Disposition Anticipated discharge today.  Patient has agreed to accept [facility] going forward.     LOS: 2 days   JacquelyJones Bales-1, Internal Medicine Teaching Service [phone] Mon-Fri) 08/21/2013, 11:40 AM

## 2013-08-21 NOTE — Progress Notes (Signed)
    Day 2 of stay      Patient name: [name]  Medical record number: [id number]  Date of birth: Jun 11, 1955  Patient looks much improved. She has no new complaints.   Filed Vitals:   08/20/13 2012 08/20/13 2321 08/21/13 0035 08/21/13 0610  BP:  120/56  133/77  Pulse:  66  67  Temp:  98 F (36.7 C)  98.3 F (36.8 C)  TempSrc:  Oral  Oral  Resp:  16  18  Height:      Weight:    236 lb 8.9 oz (107.3 kg)  SpO2: 96% 94% 94% 96%     Physical Exam   General: Resting in bed. HEENT: PERRL, EOMI, no scleral icterus. Heart: RRR, no rubs, murmurs or gallops. Lungs: Clear to auscultation bilaterally, no wheezes, rales, or rhonchi. Abdomen: Soft, nontender, nondistended, BS present. Extremities: Warm, no pedal edema. Neuro: Alert and oriented X3, gross intact.   Recent Labs Lab 08/19/13 1055 08/20/13 0730  HGB 13.2 13.4  HCT 42.8 43.3  WBC 5.5 8.3  PLT 124* 151     Recent Labs Lab 08/19/13 1055 08/19/13 1751 08/20/13 0730 08/21/13 0500  NA 142  --  144 143  K 5.0  --  4.6 4.6  CL 99  --  97 94*  CO2 39*  --  43* 42*  GLUCOSE 97  --  109* 105*  BUN 12  --  16 21  CREATININE 0.71  --  0.64 0.73  CALCIUM 9.2  --  9.4 9.8  MG  --  1.7  --   --   PHOS  --  4.0  --   --    Output -  >1 liter since admission  Weight change - 246lbs to 236 lbs in 3 days.  Assessment and Plan    The patient seems improved. She is closer to her outpatient weight. She will be sent home on increased dose of lasix 60 mg per day. Her ambulatory sats dropped to 88-89% on 4l when ambulated. We will ambulate her again on 5l and reevaluate. If she keeps >90% on 5l then we will send her home on 5l with an early PCP follow up and she can be reevaluated and oxygen readjusted based on her recovery.  Had a discussion with patient that she needs to see pulmonology and cardiology as outpatient and she agrees. Will ask PCP for the specialty referrals upon hospital follow up.    I have discussed  the care of this patient with my IM team residents. Please see the resident note for details.  Carmella Kees 08/21/2013, 12:57 PM.

## 2013-08-21 NOTE — Progress Notes (Signed)
Pts sat on 4L [place] is 94% at rest. Ambulated in hallway on 4L. Sat dropped to 88-89%. Pt reports no discomfort, feel back to baseline. Sats come back to 94% after resting

## 2013-08-22 LAB — RESPIRATORY VIRUS PANEL
Adenovirus: NOT DETECTED
INFLUENZA A H1: NOT DETECTED
INFLUENZA A H3: NOT DETECTED
INFLUENZA A: NOT DETECTED
INFLUENZA B 1: NOT DETECTED
Metapneumovirus: NOT DETECTED
PARAINFLUENZA 3 A: NOT DETECTED
Parainfluenza 1: NOT DETECTED
Parainfluenza 2: NOT DETECTED
RESPIRATORY SYNCYTIAL VIRUS A: NOT DETECTED
RESPIRATORY SYNCYTIAL VIRUS B: NOT DETECTED
Rhinovirus: NOT DETECTED

## 2013-08-26 LAB — CULTURE, BLOOD (ROUTINE X 2): CULTURE: NO GROWTH

## 2013-08-30 NOTE — Discharge Summary (Signed)
Reviewed. Agree with documentation.

## 2013-11-25 ENCOUNTER — Emergency Department (HOSPITAL_COMMUNITY): Payer: Medicare HMO

## 2013-11-25 ENCOUNTER — Encounter (HOSPITAL_COMMUNITY): Payer: Self-pay | Admitting: Emergency Medicine

## 2013-11-25 ENCOUNTER — Inpatient Hospital Stay (HOSPITAL_COMMUNITY)
Admission: EM | Admit: 2013-11-25 | Discharge: 2013-11-27 | DRG: 189 | Disposition: A | Discharge status: Home / Self Care | Payer: Medicare HMO | Attending: Internal Medicine | Admitting: Internal Medicine

## 2013-11-25 DIAGNOSIS — I2510 Atherosclerotic heart disease of native coronary artery without angina pectoris: Secondary | ICD-10-CM | POA: Diagnosis present

## 2013-11-25 DIAGNOSIS — E669 Obesity, unspecified: Secondary | ICD-10-CM | POA: Diagnosis present

## 2013-11-25 DIAGNOSIS — Z6841 Body Mass Index (BMI) 40.0 and over, adult: Secondary | ICD-10-CM | POA: Diagnosis not present

## 2013-11-25 DIAGNOSIS — G4733 Obstructive sleep apnea (adult) (pediatric): Secondary | ICD-10-CM | POA: Diagnosis present

## 2013-11-25 DIAGNOSIS — E662 Morbid (severe) obesity with alveolar hypoventilation: Secondary | ICD-10-CM | POA: Diagnosis present

## 2013-11-25 DIAGNOSIS — J441 Chronic obstructive pulmonary disease with (acute) exacerbation: Secondary | ICD-10-CM | POA: Diagnosis present

## 2013-11-25 DIAGNOSIS — F17200 Nicotine dependence, unspecified, uncomplicated: Secondary | ICD-10-CM | POA: Diagnosis present

## 2013-11-25 DIAGNOSIS — I2582 Chronic total occlusion of coronary artery: Secondary | ICD-10-CM | POA: Diagnosis present

## 2013-11-25 DIAGNOSIS — IMO0002 Reserved for concepts with insufficient information to code with codable children: Secondary | ICD-10-CM | POA: Diagnosis not present

## 2013-11-25 DIAGNOSIS — R0602 Shortness of breath: Secondary | ICD-10-CM | POA: Diagnosis present

## 2013-11-25 DIAGNOSIS — I5033 Acute on chronic diastolic (congestive) heart failure: Secondary | ICD-10-CM | POA: Diagnosis present

## 2013-11-25 DIAGNOSIS — Z888 Allergy status to other drugs, medicaments and biological substances status: Secondary | ICD-10-CM

## 2013-11-25 DIAGNOSIS — J9622 Acute and chronic respiratory failure with hypercapnia: Secondary | ICD-10-CM | POA: Diagnosis present

## 2013-11-25 DIAGNOSIS — Z8249 Family history of ischemic heart disease and other diseases of the circulatory system: Secondary | ICD-10-CM

## 2013-11-25 DIAGNOSIS — Z79899 Other long term (current) drug therapy: Secondary | ICD-10-CM

## 2013-11-25 DIAGNOSIS — I2789 Other specified pulmonary heart diseases: Secondary | ICD-10-CM | POA: Diagnosis present

## 2013-11-25 DIAGNOSIS — E872 Acidosis, unspecified: Secondary | ICD-10-CM | POA: Diagnosis present

## 2013-11-25 DIAGNOSIS — I272 Pulmonary hypertension, unspecified: Secondary | ICD-10-CM | POA: Diagnosis present

## 2013-11-25 DIAGNOSIS — J9620 Acute and chronic respiratory failure, unspecified whether with hypoxia or hypercapnia: Principal | ICD-10-CM | POA: Diagnosis present

## 2013-11-25 DIAGNOSIS — Z8614 Personal history of Methicillin resistant Staphylococcus aureus infection: Secondary | ICD-10-CM

## 2013-11-25 DIAGNOSIS — I509 Heart failure, unspecified: Secondary | ICD-10-CM | POA: Diagnosis present

## 2013-11-25 DIAGNOSIS — J9600 Acute respiratory failure, unspecified whether with hypoxia or hypercapnia: Secondary | ICD-10-CM | POA: Insufficient documentation

## 2013-11-25 DIAGNOSIS — R079 Chest pain, unspecified: Secondary | ICD-10-CM | POA: Diagnosis present

## 2013-11-25 DIAGNOSIS — Z720 Tobacco use: Secondary | ICD-10-CM | POA: Diagnosis present

## 2013-11-25 DIAGNOSIS — E785 Hyperlipidemia, unspecified: Secondary | ICD-10-CM | POA: Diagnosis present

## 2013-11-25 DIAGNOSIS — K219 Gastro-esophageal reflux disease without esophagitis: Secondary | ICD-10-CM | POA: Diagnosis present

## 2013-11-25 DIAGNOSIS — R0789 Other chest pain: Secondary | ICD-10-CM | POA: Diagnosis present

## 2013-11-25 DIAGNOSIS — Z66 Do not resuscitate: Secondary | ICD-10-CM | POA: Diagnosis present

## 2013-11-25 DIAGNOSIS — Z9861 Coronary angioplasty status: Secondary | ICD-10-CM | POA: Diagnosis not present

## 2013-11-25 DIAGNOSIS — Z801 Family history of malignant neoplasm of trachea, bronchus and lung: Secondary | ICD-10-CM | POA: Diagnosis not present

## 2013-11-25 DIAGNOSIS — J9601 Acute respiratory failure with hypoxia: Secondary | ICD-10-CM

## 2013-11-25 DIAGNOSIS — I5030 Unspecified diastolic (congestive) heart failure: Secondary | ICD-10-CM | POA: Diagnosis present

## 2013-11-25 DIAGNOSIS — J9602 Acute respiratory failure with hypercapnia: Secondary | ICD-10-CM

## 2013-11-25 LAB — I-STAT ARTERIAL BLOOD GAS, ED
ACID-BASE EXCESS: 14 mmol/L — AB (ref 0.0–2.0)
Bicarbonate: 46.2 mEq/L — ABNORMAL HIGH (ref 20.0–24.0)
O2 SAT: 85 %
TCO2: 49 mmol/L (ref 0–100)
pCO2 arterial: 95 mmHg (ref 35.0–45.0)
pH, Arterial: 7.294 — ABNORMAL LOW (ref 7.350–7.450)
pO2, Arterial: 60 mmHg — ABNORMAL LOW (ref 80.0–100.0)

## 2013-11-25 LAB — CBC
HCT: 47.7 % — ABNORMAL HIGH (ref 36.0–46.0)
Hemoglobin: 14.6 g/dL (ref 12.0–15.0)
MCH: 30.6 pg (ref 26.0–34.0)
MCHC: 30.6 g/dL (ref 30.0–36.0)
MCV: 100 fL (ref 78.0–100.0)
Platelets: 135 10*3/uL — ABNORMAL LOW (ref 150–400)
RBC: 4.77 MIL/uL (ref 3.87–5.11)
RDW: 13.4 % (ref 11.5–15.5)
WBC: 7.2 10*3/uL (ref 4.0–10.5)

## 2013-11-25 LAB — BASIC METABOLIC PANEL
ANION GAP: 6 (ref 5–15)
BUN: 13 mg/dL (ref 6–23)
CALCIUM: 9.1 mg/dL (ref 8.4–10.5)
CO2: 40 mEq/L (ref 19–32)
Chloride: 99 mEq/L (ref 96–112)
Creatinine, Ser: 0.74 mg/dL (ref 0.50–1.10)
GFR calc Af Amer: >90 mL/min (ref >90)
Glucose, Bld: 120 mg/dL — ABNORMAL HIGH (ref 70–99)
POTASSIUM: 4.7 meq/L (ref 3.7–5.3)
SODIUM: 145 meq/L (ref 137–147)

## 2013-11-25 LAB — I-STAT TROPONIN, ED: Troponin i, poc: 0 ng/mL (ref 0.00–0.08)

## 2013-11-25 LAB — PRO B NATRIURETIC PEPTIDE: PRO B NATRI PEPTIDE: 1501 pg/mL — AB (ref 0–125)

## 2013-11-25 MED ORDER — LEVOFLOXACIN IN D5W 750 MG/150ML IV SOLN
750.0000 mg | Freq: Once | INTRAVENOUS | Status: AC
  Administered 2013-11-25: 750 mg via INTRAVENOUS
  Filled 2013-11-25: qty 150

## 2013-11-25 MED ORDER — SODIUM CHLORIDE 0.9 % IJ SOLN
3.0000 mL | Freq: Two times a day (BID) | INTRAMUSCULAR | Status: DC
  Administered 2013-11-26 – 2013-11-27 (×3): 3 mL via INTRAVENOUS

## 2013-11-25 MED ORDER — MOMETASONE FURO-FORMOTEROL FUM 200-5 MCG/ACT IN AERO
2.0000 | INHALATION_SPRAY | Freq: Two times a day (BID) | RESPIRATORY_TRACT | Status: DC
  Administered 2013-11-25 – 2013-11-27 (×4): 2 via RESPIRATORY_TRACT
  Filled 2013-11-25: qty 8.8

## 2013-11-25 MED ORDER — TIOTROPIUM BROMIDE MONOHYDRATE 18 MCG IN CAPS
18.0000 ug | ORAL_CAPSULE | Freq: Every day | RESPIRATORY_TRACT | Status: DC
  Filled 2013-11-25: qty 5

## 2013-11-25 MED ORDER — ACETAMINOPHEN 650 MG RE SUPP: 650.0000 mg | Freq: Four times a day (QID) | RECTAL | Status: DC | PRN

## 2013-11-25 MED ORDER — IPRATROPIUM-ALBUTEROL 0.5-2.5 (3) MG/3ML IN SOLN
3.0000 mL | RESPIRATORY_TRACT | Status: DC
  Administered 2013-11-25 (×2): 3 mL via RESPIRATORY_TRACT
  Filled 2013-11-25 (×2): qty 3

## 2013-11-25 MED ORDER — OLOPATADINE HCL 0.1 % OP SOLN
1.0000 [drp] | Freq: Two times a day (BID) | OPHTHALMIC | Status: DC
  Administered 2013-11-25 – 2013-11-27 (×4): 1 [drp] via OPHTHALMIC
  Filled 2013-11-25: qty 5

## 2013-11-25 MED ORDER — ACETAMINOPHEN 325 MG PO TABS: 650.0000 mg | ORAL_TABLET | Freq: Four times a day (QID) | ORAL | Status: DC | PRN

## 2013-11-25 MED ORDER — ALBUTEROL SULFATE (2.5 MG/3ML) 0.083% IN NEBU: 2.5000 mg | INHALATION_SOLUTION | RESPIRATORY_TRACT | Status: DC | PRN

## 2013-11-25 MED ORDER — ALBUTEROL (5 MG/ML) CONTINUOUS INHALATION SOLN
10.0000 mg/h | INHALATION_SOLUTION | Freq: Once | RESPIRATORY_TRACT | Status: AC
  Administered 2013-11-25: 10 mg/h via RESPIRATORY_TRACT
  Filled 2013-11-25: qty 20

## 2013-11-25 MED ORDER — SIMVASTATIN 10 MG PO TABS
10.0000 mg | ORAL_TABLET | Freq: Every day | ORAL | Status: DC
  Administered 2013-11-26: 10 mg via ORAL
  Filled 2013-11-25 (×3): qty 1

## 2013-11-25 MED ORDER — ENOXAPARIN SODIUM 40 MG/0.4ML ~~LOC~~ SOLN
40.0000 mg | SUBCUTANEOUS | Status: DC
  Administered 2013-11-25 – 2013-11-26 (×2): 40 mg via SUBCUTANEOUS
  Filled 2013-11-25 (×3): qty 0.4

## 2013-11-25 MED ORDER — LEVOFLOXACIN IN D5W 750 MG/150ML IV SOLN
750.0000 mg | INTRAVENOUS | Status: DC
  Administered 2013-11-26: 750 mg via INTRAVENOUS
  Filled 2013-11-25 (×2): qty 150

## 2013-11-25 MED ORDER — METHYLPREDNISOLONE SODIUM SUCC 125 MG IJ SOLR
125.0000 mg | Freq: Once | INTRAMUSCULAR | Status: AC
  Administered 2013-11-25: 125 mg via INTRAVENOUS
  Filled 2013-11-25: qty 2

## 2013-11-25 MED ORDER — SODIUM CHLORIDE 0.9 % IJ SOLN
3.0000 mL | Freq: Two times a day (BID) | INTRAMUSCULAR | Status: DC
  Administered 2013-11-25: 3 mL via INTRAVENOUS

## 2013-11-25 MED ORDER — IPRATROPIUM-ALBUTEROL 0.5-2.5 (3) MG/3ML IN SOLN
3.0000 mL | Freq: Three times a day (TID) | RESPIRATORY_TRACT | Status: DC
  Administered 2013-11-26 (×2): 3 mL via RESPIRATORY_TRACT
  Filled 2013-11-25 (×2): qty 3

## 2013-11-25 NOTE — ED Notes (Signed)
Admitting at bedside.

## 2013-11-25 NOTE — ED Notes (Signed)
Respiratory tech paged and made aware of orders.

## 2013-11-25 NOTE — ED Notes (Signed)
Pt c/o SOB starting yesterday; pt noted to be tachpanic and cyanotic upon exam; pt on home O2 4L with sats 76%; pt placed on NRB with sats 98% on 15L

## 2013-11-25 NOTE — Progress Notes (Signed)
Pt transported on BiPAP V60 to 3S08. No complications noted. Vitals stable.

## 2013-11-25 NOTE — ED Notes (Signed)
pt presents with increased sob x2 days, also reports +1 pitting edema to bilateral legs. Pt states she is supporse to take 80  mg of lasix a day but has only been taking 40 mg a day because " I dont want to be in the bathroom all day."

## 2013-11-25 NOTE — ED Provider Notes (Signed)
CSN: [id number]     Arrival date & time 11/25/13  1056 History   First MD Initiated Contact with Patient 11/25/13 1112     Chief Complaint  Patient presents with  . Shortness of Breath     (Consider location/radiation/quality/duration/timing/severity/associated sxs/prior Treatment) HPI Comments: Patient presents to the ER for evaluation of shortness of breath. Patient reports that she started to feel increased shortness of breath yesterday. She said she was up last night with difficulty breathing and then this morning it worsened. Patient reports that she could not ambulate in her home without getting severely short of breath. She even had to use her oxygen while in the shower this morning. Patient has been experiencing some discomfort in the center of her chest associated with shortness of breath. She describes it as a heaviness. Discomfort is not currently present. She has not had any fever or increased shortness of breath.  Patient is a 58 y.o. female presenting with shortness of breath.  Shortness of Breath Associated symptoms: chest pain     Past Medical History  Diagnosis Date  . Coronary artery disease     S/P PCI of LAD with DES (12/2008). Total occlusion of RCA noted at that time., medically managed. ACS ruled out 03/2009 with Lexiscan myoview . Followed by [facility].  . Pulmonary hypertension     2-D Echo (70/0174) - Systolic pressure was moderately increased. PA peak pressure  28mHg. 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 Followed by Dr. WJoya Gaskins(Velora Heckler  . Diastolic dysfunction     2-D Echo (12/2008) - Normal LV Systolic funciton with EF 60-65%. Grade 1 diastolid dysfunction. No regional wall motion abnormalities. Moderate pulmonary HTN with PA peak pressure 516mg.  . Marland KitchenOPD (chronic obstructive pulmonary disease)     Severe. Gold Stage IV.  PFTs (12/2008) - severe  obstructive airway disease. Active tobacco use. Requires 4L O2 at home.  . Pulmonary nodule, right     Small right middle lobe nodule. Stable as of 12/2008.  . Marland Kitchenleep apnea     Presumptive. No documented sleep studies.   . Prediabetes 12/2008    HgbA1c 6.4 (12/2008)  . Hx MRSA infection     Recurrent MRSA thigh abscesses.  . Tobacco abuse     Ongoing.  . Obesity   . Hyperlipidemia   . GERD (gastroesophageal reflux disease)     S/P Nissen fundoplication.  . Shortness of breath   . CHF (congestive heart failure)    Past Surgical History  Procedure Laterality Date  . Total abdominal hysterectomy w/ bilateral salpingoophorectomy    . Nissen fundoplication     Family History  Problem Relation Age of Onset  . Heart disease Mother 472  Deceased from MI at 59yo. Hypertension Mother   . Heart disease Father 5471  Deceased of MI age 59yo. Hypertension Father   . Hypertension Brother   . Lung cancer      Grandmother   History  Substance Use Topics  . Smoking status: Current Every Day Smoker -- 0.50 packs/day for 40 years    Types: Cigarettes  . Smokeless tobacco: Never Used  . Alcohol Use: No   OB History   Grav Para Term Preterm Abortions TAB SAB Ect Mult Living                 Review of Systems  Respiratory: Positive for shortness of breath.   Cardiovascular: Positive for chest pain.  All other systems reviewed and are negative.     Allergies  Fluconazole  Home Medications   Prior to Admission medications   Medication Sig Start Date End Date Taking? Authorizing Provider  albuterol (ACCUNEB) 1.25 MG/3ML nebulizer solution Take 1 ampule by nebulization every 6 (six) hours as needed for wheezing.    Historical Provider, MD  albuterol (PROVENTIL HFA;VENTOLIN HFA) 108 (90 BASE) MCG/ACT inhaler Inhale 1 puff into the lungs every 4 (four) hours as needed for wheezing or shortness of breath. 06/13/13   Jessee Avers, MD  aspirin 81 MG chewable tablet Chew 1 tablet  (81 mg total) by mouth daily. 11/09/12   Charlann Lange, MD  Fluticasone-Salmeterol (ADVAIR) 500-50 MCG/DOSE AEPB Inhale 1 puff into the lungs 2 (two) times daily.    Historical Provider, MD  furosemide (LASIX) 20 MG tablet Take 3 tablets (60 mg total) by mouth daily. 08/21/13   Jones Bales, MD  lovastatin (MEVACOR) 20 MG tablet Take 1 tablet (20 mg total) by mouth at bedtime. 06/12/13   Jessee Avers, MD  Olopatadine HCl 0.2 % SOLN Apply 1 drop to eye 2 (two) times daily. 08/07/13   Jessee Avers, MD  predniSONE (DELTASONE) 20 MG tablet Take 2 tablets (40 mg total) by mouth daily with breakfast. 08/21/13   Jones Bales, MD  tiotropium (SPIRIVA HANDIHALER) 18 MCG inhalation capsule Place 1 capsule (18 mcg total) into inhaler and inhale daily. 06/12/13   Jessee Avers, MD   BP 107/72  Pulse 81  Resp 18  Ht _0  (1.651 m)  Wt 245 lb (111.131 kg)  BMI 40.77 kg/m2  SpO2 91% Physical Exam  Constitutional: She is oriented to person, place, and time. She appears well-developed and well-nourished. No distress.  HENT:  Head: Normocephalic and atraumatic.  Right Ear: Hearing normal.  Left Ear: Hearing normal.  Nose: Nose normal.  Mouth/Throat: Oropharynx is clear and moist and mucous membranes are normal.  Eyes: Conjunctivae and EOM are normal. Pupils are equal, round, and reactive to light.  Neck: Normal range of motion. Neck supple.  Cardiovascular: Regular rhythm, S1 normal and S2 normal.  Exam reveals no gallop and no friction rub.   No murmur heard. Pulmonary/Chest: Accessory muscle usage present. Tachypnea noted. She is in respiratory distress. She has decreased breath sounds. She exhibits no tenderness.  Abdominal: Soft. Normal appearance and bowel sounds are normal. There is no hepatosplenomegaly. There is no tenderness. There is no rebound, no guarding, no tenderness at McBurney's point and negative Murphy's sign. No hernia.  Musculoskeletal: Normal range of motion.  Neurological: She is  alert and oriented to person, place, and time. She has normal strength. No cranial nerve deficit or sensory deficit. Coordination normal. GCS eye subscore is 4. GCS verbal subscore is 5. GCS motor subscore is 6.  Skin: Skin is warm, dry and intact. No rash noted. No cyanosis.  Psychiatric: She has a normal mood and affect. Her speech is normal and behavior is normal. Thought content normal.    ED Course  Procedures (including critical care time) Labs Review Labs Reviewed  [place], ED  I-STAT ARTERIAL BLOOD GAS, ED    Imaging Review No results found.   EKG Interpretation   Date/Time:  Monday November 25 2013 11:05:39 EDT Ventricular Rate:  81 PR Interval:  128 QRS Duration: 82 QT Interval:  364 QTC Calculation: 422 R Axis:   100 Text Interpretation:  Normal sinus rhythm Possible Left atrial enlargement  Rightward axis Nonspecific ST and T wave abnormality Abnormal ECG No  significant change since last tracing Confirmed by POLLINA  MD,  CHRISTOPHER [phone]) on 11/25/2013 11:23:13 AM      MDM   Final diagnoses:  None   acute hypercarbic respiratory failure  COPD exacerbation  Patient presents to the ER for evaluation of difficulty breathing. Patient reports increased shortness of breath yesterday and last night, significant worsening today. She has severe dyspnea on exertion. Patient does have a history of COPD, uses 4 L by nasal cannula continuously at home. Upon arrival to the ER, oxygen saturation was 76% on her supplemental oxygen. She was placed on a nonrebreather initially, was then weaned back down to nasal cannula. Blood gas shows significant CO2 retention, was switched to BiPAP. Patient will require admission to the hospital for further management.  CRITICAL CARE Performed by: Orpah Greek   Total critical care time: 50mn  Critical care time was exclusive of separately billable procedures  and treating other patients.  Critical care was necessary to treat or prevent imminent or life-threatening deterioration.  Critical care was time spent personally by me on the following activities: development of treatment plan with patient and/or surrogate as well as nursing, discussions with consultants, evaluation of patient's response to treatment, examination of patient, obtaining history from patient or surrogate, ordering and performing treatments and interventions, ordering and review of laboratory studies, ordering and review of radiographic studies, pulse oximetry and re-evaluation of patient's condition.     COrpah Greek MD 11/25/13 [phone]

## 2013-11-25 NOTE — Progress Notes (Signed)
ANTIBIOTIC CONSULT NOTE - INITIAL  Pharmacy Consult for levaquin Indication: COPD exacerbation  Allergies  Allergen Reactions  . Fluconazole Anaphylaxis    DIFLUCAN    Patient Measurements: Height: _0  (165.1 cm) Weight: 251 lb 12.3 oz (114.2 kg) IBW/kg (Calculated) : 57 Adjusted Body Weight:   Vital Signs: Temp: 98.6 F (37 C) (08/24 1951) Temp src: Axillary (08/24 1951) BP: 123/67 mmHg (08/24 1951) Pulse Rate: 69 (08/24 1951) Intake/Output from previous day:   Intake/Output from this shift:    Labs:  Recent Labs  11/25/13 1113  WBC 7.2  HGB 14.6  PLT 135*  CREATININE 0.74   Estimated Creatinine Clearance: 95.5 ml/min (by C-G formula based on Cr of 0.74). No results found for this basename: VANCOTROUGH, VANCOPEAK, VANCORANDOM, GENTTROUGH, GENTPEAK, GENTRANDOM, TOBRATROUGH, TOBRAPEAK, TOBRARND, AMIKACINPEAK, AMIKACINTROU, AMIKACIN,  in the last 72 hours   Microbiology: No results found for this or any previous visit (from the past 720 hour(s)).  Medical History: Past Medical History  Diagnosis Date  . Coronary artery disease     S/P PCI of LAD with DES (12/2008). Total occlusion of RCA noted at that time., medically managed. ACS ruled out 03/2009 with Lexiscan myoview . Followed by .  . Pulmonary hypertension     2-D Echo (47/9987) - Systolic pressure was moderately increased. PA peak pressure  33mHg. 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 Followed by Dr. WJoya Gaskins(Velora Heckler  . Diastolic dysfunction     2-D Echo (12/2008) - Normal LV Systolic funciton with EF 60-65%. Grade 1 diastolid dysfunction. No regional wall motion abnormalities. Moderate pulmonary HTN with PA peak pressure 531mg.  . Marland KitchenOPD (chronic obstructive pulmonary disease)     Severe. Gold Stage IV.  PFTs (12/2008) - severe obstructive airway disease. Active tobacco use. Requires 4L O2 at  home.  . Pulmonary nodule, right     Small right middle lobe nodule. Stable as of 12/2008.  . Marland Kitchenleep apnea     Presumptive. No documented sleep studies.   . Prediabetes 12/2008    HgbA1c 6.4 (12/2008)  . Hx MRSA infection     Recurrent MRSA thigh abscesses.  . Tobacco abuse     Ongoing.  . Obesity   . Hyperlipidemia   . GERD (gastroesophageal reflux disease)     S/P Nissen fundoplication.  . Shortness of breath   . CHF (congestive heart failure)     Medications:  Anti-infectives   Start     Dose/Rate Route Frequency Ordered Stop   11/26/13 1400  levofloxacin (LEVAQUIN) IVPB 750 mg     750 mg 100 mL/hr over 90 Minutes Intravenous Every 24 hours 11/25/13 2053     11/25/13 1400  levofloxacin (LEVAQUIN) IVPB 750 mg     750 mg 100 mL/hr over 90 Minutes Intravenous  Once 11/25/13 1347 11/25/13 1547     Assessment: 5951of presented to the hospital with SOB. To start levaquin for COPD exacerbation. Pt is afebrile, WBC Is WNL and Scr is WNL. She has already received her first dose earlier today.   Levaquin 8/24>>  Goal of Therapy:  Eradication of infection  Plan:  1. Levaquin 75064mV Q24H 2. F/u renal fxn, C&S, clinical status  Jaquila Santelli, RacRande Lawman24/2015,8:55 PM

## 2013-11-25 NOTE — H&P (Signed)
Date: 11/25/2013               Patient Name:  [name] MRN: [id number]  DOB: 06-May-1955 Age / Sex: 58 y.o., female   PCP: Provider Default, MD         Medical Service: Internal Medicine Teaching Service         Attending Physician: Dr. Bartholomew Crews, MD    First Contact: Dr. Charlott Rakes Pager: [phone]  Second Contact: Dr. Adele Barthel Pager: [phone]       After Hours (After 5p/  First Contact Pager: [phone]  weekends / holidays): Second Contact Pager: [phone]   Chief Complaint: shortness of breath  History of Present Illness: [name] is 58 year old with COPD, CHF (EF 55-60%, 11/07/13), CAD, who presents today with shortness of breath. Interview was limited by her BiPAP mask.   She first noted symptoms on Saturday after coming home from seeing a friend. She was able to sleep through the night and slept most of Sunday. This morning, as she stepped into the shower, her symptoms worsened to the point where she needed to come into the ED.   For her home COPD medications, she takes Advair x 2/day, Spiriva x 1/day, and albuterol. She is inconsistent with her albuterol, but on her worst day, she uses it three times. She was hospitalized 3/29-4/1 for acute-on-chronic respiratory failure and scheduled to follow-up with pulmonology but did not make it to the appointment. She was last seen in clinic on 5/6 by Dr. Alice Rieger and noted her non-adherence with Symbicort since it damaged her upper lip and encouraged her to attend the appointment.  Associated with this shortness of breath is a productive cough with white-colored sputum. She also notes leg swelling since she has not taken her Lasix since Saturday. She denies fever, chills, nausea, chest pain, abdominal pain, vomiting.   In the ED, ABG showed pH 7.29, CO2 95, O2 60, bicarb 46 and was started on BiPAP.   Meds: Current Facility-Administered Medications  Medication Dose Route Frequency Provider Last Rate Last Dose  .  levofloxacin (LEVAQUIN) IVPB 750 mg  750 mg Intravenous Once Orpah Greek, MD 100 mL/hr at 11/25/13 1417 750 mg at 11/25/13 1417   Current Outpatient Prescriptions  Medication Sig Dispense Refill  . albuterol (ACCUNEB) 1.25 MG/3ML nebulizer solution Take 1 ampule by nebulization every 6 (six) hours as needed for wheezing.      Marland Kitchen albuterol (PROVENTIL HFA;VENTOLIN HFA) 108 (90 BASE) MCG/ACT inhaler Inhale 1 puff into the lungs every 4 (four) hours as needed for wheezing or shortness of breath.  1 Inhaler  11  . Aspirin-Caffeine 845-65 MG PACK Take 1-2 packets by mouth every 4 (four) hours as needed (pain).      . Fluticasone-Salmeterol (ADVAIR) 500-50 MCG/DOSE AEPB Inhale 1 puff into the lungs 2 (two) times daily.      . furosemide (LASIX) 20 MG tablet Take 3 tablets (60 mg total) by mouth daily.  90 tablet  3  . lovastatin (MEVACOR) 20 MG tablet Take 1 tablet (20 mg total) by mouth at bedtime.  30 tablet  11  . Olopatadine HCl 0.2 % SOLN Apply 1 drop to eye 2 (two) times daily.  2.5 mL  0  . tiotropium (SPIRIVA HANDIHALER) 18 MCG inhalation capsule Place 1 capsule (18 mcg total) into inhaler and inhale daily.  30 capsule  12    Allergies: Allergies as of 11/25/2013 - Review Complete 11/25/2013  Allergen Reaction  Noted  . Fluconazole Anaphylaxis    Past Medical History  Diagnosis Date  . Coronary artery disease     S/P PCI of LAD with DES (12/2008). Total occlusion of RCA noted at that time., medically managed. ACS ruled out 03/2009 with Lexiscan myoview . Followed by [facility].  . Pulmonary hypertension     2-D Echo (89/1694) - Systolic pressure was moderately increased. PA peak pressure  81mHg. 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 Followed by Dr. WJoya Gaskins(Velora Heckler  . Diastolic dysfunction     2-D Echo (12/2008) - Normal LV Systolic funciton with EF 60-65%. Grade 1 diastolid  dysfunction. No regional wall motion abnormalities. Moderate pulmonary HTN with PA peak pressure 563mg.  . Marland KitchenOPD (chronic obstructive pulmonary disease)     Severe. Gold Stage IV.  PFTs (12/2008) - severe obstructive airway disease. Active tobacco use. Requires 4L O2 at home.  . Pulmonary nodule, right     Small right middle lobe nodule. Stable as of 12/2008.  . Marland Kitchenleep apnea     Presumptive. No documented sleep studies.   . Prediabetes 12/2008    HgbA1c 6.4 (12/2008)  . Hx MRSA infection     Recurrent MRSA thigh abscesses.  . Tobacco abuse     Ongoing.  . Obesity   . Hyperlipidemia   . GERD (gastroesophageal reflux disease)     S/P Nissen fundoplication.  . Shortness of breath   . CHF (congestive heart failure)    Past Surgical History  Procedure Laterality Date  . Total abdominal hysterectomy w/ bilateral salpingoophorectomy    . Nissen fundoplication     Family History  Problem Relation Age of Onset  . Heart disease Mother 4766  Deceased from MI at 59yo. Hypertension Mother   . Heart disease Father 5429  Deceased of MI age 59yo. Hypertension Father   . Hypertension Brother   . Lung cancer      Grandmother   History   Social History  . Marital Status: Single    Spouse Name: N/A    Number of Children: N/A  . Years of Education: N/A   Occupational History  . Not on file.   Social History Main Topics  . Smoking status: Current Every Day Smoker -- 0.50 packs/day for 40 years    Types: Cigarettes  . Smokeless tobacco: Never Used  . Alcohol Use: No  . Drug Use: No  . Sexual Activity: Not on file   Other Topics Concern  . Not on file   Social History Narrative   Formerly worked as a caScientist, water qualitynow disabled.   Divorced.   2 grown children.   Lives with her grandson.    Review of Systems: Review of Systems  Constitutional: Negative for fever and chills.  Respiratory: Positive for cough, sputum production and shortness of breath.   Cardiovascular:  Positive for leg swelling. Negative for chest pain.  Gastrointestinal: Negative for nausea, vomiting, abdominal pain, diarrhea and constipation.  All other systems reviewed and are negative.    Physical Exam: Blood pressure 121/87, pulse 79, resp. rate 21, height _0  (1.651 m), weight 245 lb (111.131 kg), SpO2 95.00%.  General: resting in bed with BiPAP mask HEENT: PERRL, EOMI, no scleral icterus Cardiac: RRR, no rubs, murmurs or gallops, limited by body habitus and machine sounds Pulm: clear to auscultation bilaterally, no wheezes, rales,  or rhonchi Abd: soft, nontender, nondistended, BS present Ext: warm and well perfused, trace pitting edema on LE bilaterally Neuro: alert and oriented X3, cranial nerves II-XII grossly intact, strength and sensation to light touch equal in bilateral upper and lower extremities   Lab results: Basic Metabolic Panel:  Recent Labs  11/25/13 1113  NA 145  K 4.7  CL 99  CO2 40*  GLUCOSE 120*  BUN 13  CREATININE 0.74  CALCIUM 9.1   CBC:  Recent Labs  11/25/13 1113  WBC 7.2  HGB 14.6  HCT 47.7*  MCV 100.0  PLT 135*   BNP:  Recent Labs  11/25/13 1138  PROBNP 1501.0*    Imaging results:  Dg Chest 2 View  11/25/2013   CLINICAL DATA:  Shortness of breath, chest tightness, history COPD, pulmonary hypertension, coronary artery disease, smoking, CHF  EXAM: CHEST  2 VIEW  COMPARISON:  08/19/2013  FINDINGS: Enlargement of cardiac silhouette with pulmonary vascular congestion.  Mediastinal contour stable.  Slightly decreased interstitial prominence since previous exam.  No definite acute pulmonary edema, segmental consolidation, pleural effusion, or pneumothorax.  Bones appear demineralized with endplate spur formation lower thoracic spine.  Surgical clips at perigastric region.  IMPRESSION: Enlargement of cardiac silhouette with chronic pulmonary vascular congestion.  No acute pulmonary edema or consolidation identified.   Electronically  Signed   By: Lavonia Dana M.D.   On: 11/25/2013 12:41    Other results: EKG: Reviewed and compared with 08/19/13 Normal rhythm Normal axis Possible tall P waves in II, III, aVF stable from prior EKG   Assessment & Plan by Problem: Ms. Mullan is 58 year old with COPD, CHF, pulmonary HTN, CAD, tobacco abuse who presents today with acute-on-chronic hypoxic hypercarbic respiratory failure.  #Acute-on-chronic hypoxic hypercarbic respiratory failure: COPD exacerbation is possible given her history of non-adherence to treatment though a trigger cannot be identified. Infection is possible though she is not febrile and does not report any sick contacts. Though she missed her dose of Lasix, CHF exacerbation is less likely given CXR findings.  -Continue BiPAP -Reassess tonight and wean as tolerated -Give solumedrol 175m IV and consider prednisone if symptoms do not improve -Continue albuterol, Duoneb, Dulera, Spiriva  #CHF: Pro BNP on admission was 1501 vs 1093 (5/18). Though she does have some edema, SBP is ranging 94-128 and precludes u[place]oreafrom starting her on home dose of Lasix.   #CAD: Continue simvastatin 130m -Monitor on telemetry  #FEN:  -Diet: NPO  #DVT prophylaxis: Lovenox  #CODE STATUS: DNR/DNI -We discussed with the patient the risks/benefits of intubation and CPR, and she declined. She acknowledged understanding of her code status.  Dispo: Disposition is deferred at this time, awaiting improvement of current medical problems. Anticipated discharge in approximately 1-2 day(s).   The patient does not have a current PCP (Provider Default, MD) and [facility].  The patient does not know have transportation limitations that hinder transportation to clinic appointments.  Signed: RuCharlott RakesMD 11/25/2013, 2:33 PM

## 2013-11-26 ENCOUNTER — Encounter (HOSPITAL_COMMUNITY): Payer: Self-pay | Admitting: *Deleted

## 2013-11-26 DIAGNOSIS — J9620 Acute and chronic respiratory failure, unspecified whether with hypoxia or hypercapnia: Principal | ICD-10-CM

## 2013-11-26 DIAGNOSIS — I2510 Atherosclerotic heart disease of native coronary artery without angina pectoris: Secondary | ICD-10-CM

## 2013-11-26 DIAGNOSIS — F17200 Nicotine dependence, unspecified, uncomplicated: Secondary | ICD-10-CM

## 2013-11-26 DIAGNOSIS — R079 Chest pain, unspecified: Secondary | ICD-10-CM | POA: Diagnosis present

## 2013-11-26 DIAGNOSIS — I509 Heart failure, unspecified: Secondary | ICD-10-CM

## 2013-11-26 LAB — COMPREHENSIVE METABOLIC PANEL
ALK PHOS: 97 U/L (ref 39–117)
ALT: 8 U/L (ref 0–35)
AST: 9 U/L (ref 0–37)
Albumin: 3 g/dL — ABNORMAL LOW (ref 3.5–5.2)
Anion gap: 4 — ABNORMAL LOW (ref 5–15)
BILIRUBIN TOTAL: 0.4 mg/dL (ref 0.3–1.2)
BUN: 15 mg/dL (ref 6–23)
CALCIUM: 9.4 mg/dL (ref 8.4–10.5)
CHLORIDE: 98 meq/L (ref 96–112)
CO2: 41 meq/L — AB (ref 19–32)
Creatinine, Ser: 0.65 mg/dL (ref 0.50–1.10)
GFR calc Af Amer: >90 mL/min (ref >90)
Glucose, Bld: 150 mg/dL — ABNORMAL HIGH (ref 70–99)
Potassium: 4.7 mEq/L (ref 3.7–5.3)
SODIUM: 143 meq/L (ref 137–147)
Total Protein: 6.3 g/dL (ref 6.0–8.3)

## 2013-11-26 LAB — CBC
HCT: 44.7 % (ref 36.0–46.0)
Hemoglobin: 13.8 g/dL (ref 12.0–15.0)
MCH: 30.1 pg (ref 26.0–34.0)
MCHC: 30.9 g/dL (ref 30.0–36.0)
MCV: 97.4 fL (ref 78.0–100.0)
PLATELETS: 143 10*3/uL — AB (ref 150–400)
RBC: 4.59 MIL/uL (ref 3.87–5.11)
RDW: 13.3 % (ref 11.5–15.5)
WBC: 6.1 10*3/uL (ref 4.0–10.5)

## 2013-11-26 LAB — TROPONIN I: Troponin I: <0.3 ng/mL (ref <0.30)

## 2013-11-26 MED ORDER — IPRATROPIUM-ALBUTEROL 0.5-2.5 (3) MG/3ML IN SOLN: 3.0000 mL | RESPIRATORY_TRACT | Status: DC | PRN

## 2013-11-26 MED ORDER — PREDNISONE 50 MG PO TABS
60.0000 mg | ORAL_TABLET | Freq: Three times a day (TID) | ORAL | Status: DC
  Administered 2013-11-26: 60 mg via ORAL
  Filled 2013-11-26 (×3): qty 1

## 2013-11-26 MED ORDER — FUROSEMIDE 20 MG PO TABS
20.0000 mg | ORAL_TABLET | Freq: Every day | ORAL | Status: DC
Start: 2013-11-26 — End: 2013-11-27
  Administered 2013-11-26: 20 mg via ORAL
  Filled 2013-11-26 (×2): qty 1

## 2013-11-26 MED ORDER — TIOTROPIUM BROMIDE MONOHYDRATE 18 MCG IN CAPS
18.0000 ug | ORAL_CAPSULE | Freq: Every day | RESPIRATORY_TRACT | Status: DC
  Administered 2013-11-27: 18 ug via RESPIRATORY_TRACT
  Filled 2013-11-26: qty 5

## 2013-11-26 MED ORDER — PREDNISONE 50 MG PO TABS
60.0000 mg | ORAL_TABLET | Freq: Every day | ORAL | Status: DC
  Administered 2013-11-27: 60 mg via ORAL
  Filled 2013-11-26: qty 1

## 2013-11-26 MED ORDER — TRAMADOL HCL 50 MG PO TABS
50.0000 mg | ORAL_TABLET | Freq: Four times a day (QID) | ORAL | Status: DC | PRN
  Administered 2013-11-26: 50 mg via ORAL
  Filled 2013-11-26: qty 1

## 2013-11-26 MED ORDER — FUROSEMIDE 20 MG PO TABS
20.0000 mg | ORAL_TABLET | Freq: Once | ORAL | Status: DC
  Filled 2013-11-26: qty 1

## 2013-11-26 NOTE — Progress Notes (Signed)
Utilization review completed

## 2013-11-26 NOTE — Progress Notes (Signed)
Pt transferred to 5W04 via wheelchair per order. Report called to [facility]. Belongings sent with pt, family made aware by pt of new room number. CCMD(Tisha) called and notified of transfer. Pt stable at time of transfer

## 2013-11-26 NOTE — Progress Notes (Signed)
Pt refusing bedalarm. Pt states that she will call for assistance. Placed yellow armband and yellow socks on pt. Will continue to monitor pt. [name]

## 2013-11-26 NOTE — Progress Notes (Signed)
Subjective: Overnight, she complained of R chest pain that was reproducible. Troponins were cycled, and EKG was repeated.  This AM, she is off BiPAP on 4L O2 and is able to talk to [place]. She says she is on O2 at home though uses her friend's supply and states that it is 4L. She also notes increased sleepiness during the daytime though has never been formally tested for OSA. She also notes a headache and has not had a meal for 3 days.  Objective: Vital signs in last 24 hours: Filed Vitals:   11/26/13 0850 11/26/13 1207 11/26/13 1442 11/26/13 1500  BP:  123/64  107/60  Pulse:  71  77  Temp:  98.6 F (37 C)  98.3 F (36.8 C)  TempSrc:  Oral  Oral  Resp:  20  19  Height:      Weight:      SpO2: 98% 93% 93% 92%   Weight change:   Intake/Output Summary (Last 24 hours) at 11/26/13 1756 Last data filed at 11/26/13 1500  Gross per 24 hour  Intake    750 ml  Output    400 ml  Net    350 ml   General: resting in bed on 4L O2 by  HEENT: PERRL, EOMI, no scleral icterus Cardiac: RRR, no rubs, murmurs or gallops Pulm: bibasilar crackles, no wheezes, rales, or rhonchi Abd: soft, nontender, nondistended, BS present Ext: warm and well perfused, trace pedal edema bilaterally Neuro: responding to our questions appropriately   Lab Results: Basic Metabolic Panel:  Recent Labs Lab 11/25/13 1113 11/26/13 0051  NA 145 143  K 4.7 4.7  CL 99 98  CO2 40* 41*  GLUCOSE 120* 150*  BUN 13 15  CREATININE 0.74 0.65  CALCIUM 9.1 9.4   Liver Function Tests:  Recent Labs Lab 11/26/13 0051  AST 9  ALT 8  ALKPHOS 97  BILITOT 0.4  PROT 6.3  ALBUMIN 3.0*   CBC:  Recent Labs Lab 11/25/13 1113 11/26/13 0051  WBC 7.2 6.1  HGB 14.6 13.8  HCT 47.7* 44.7  MCV 100.0 97.4  PLT 135* 143*   Cardiac Enzymes:  Recent Labs Lab 11/26/13 0051 11/26/13 0654  TROPONINI <0.30 <0.30    Studies/Results: Dg Chest 2 View  11/25/2013   CLINICAL DATA:  Shortness of breath, chest  tightness, history COPD, pulmonary hypertension, coronary artery disease, smoking, CHF  EXAM: CHEST  2 VIEW  COMPARISON:  08/19/2013  FINDINGS: Enlargement of cardiac silhouette with pulmonary vascular congestion.  Mediastinal contour stable.  Slightly decreased interstitial prominence since previous exam.  No definite acute pulmonary edema, segmental consolidation, pleural effusion, or pneumothorax.  Bones appear demineralized with endplate spur formation lower thoracic spine.  Surgical clips at perigastric region.  IMPRESSION: Enlargement of cardiac silhouette with chronic pulmonary vascular congestion.  No acute pulmonary edema or consolidation identified.   Electronically Signed   By: Lavonia Dana M.D.   On: 11/25/2013 12:41   EKG: Reviewed and compared with 11/25/13 Normal sinus rhythm Right axis T wave inversions in V1-V4  Medications: I have reviewed the patient's current medications. Scheduled Meds: . enoxaparin (LOVENOX) injection  40 mg Subcutaneous Q24H  . furosemide  20 mg Oral Daily  . furosemide  20 mg Oral Once  . levofloxacin (LEVAQUIN) IV  750 mg Intravenous Q24H  . mometasone-formoterol  2 puff Inhalation BID  . olopatadine  1 drop Both Eyes BID  . [START ON 11/27/2013] predniSONE  60 mg Oral Daily  .  simvastatin  10 mg Oral q1800  . sodium chloride  3 mL Intravenous Q12H  . sodium chloride  3 mL Intravenous Q12H  . tiotropium  18 mcg Inhalation Daily   Continuous Infusions:  PRN Meds:.acetaminophen, acetaminophen, ipratropium-albuterol Assessment/Plan: Active Problems:   Chronic diastolic CHF (congestive heart failure)   Acute on chronic respiratory failure with hypercapnia  [name] is 58 year old with COPD, CHF, pulmonary HTN, CAD, tobacco abuse who was hospitalized for  acute-on-chronic hypoxic hypercarbic respiratory failure with right-sided chest pain likely MSK.   #Acute-on-chronic hypoxic hypercarbic respiratory failure: Likely multifactorial: poorly controlled  COPD & OSA coupled with pulmonary HTN. She is doing better today. -Discontinue BiPAP -Give prednisone 2m today and consider for tomorrow pending her symptoms -Switch to Spiriva -Switch Duoneb to q4h prn for wheezing -Continue Dulera -Transfer to floor today  #R chest pain: Troponins were negative x 3. EKG findings do show signs of right heart strain.  -Give Tylenol prn for pain  #CAD: Continue simvastatin 137m #CHF: As she can tolerate PO intake, she can resume Lasix 2010mShe takes 55m14m home. -Reassess later and add another dose if necessary    #FEN:  -Diet: Heart Healthy  #DVT prophylaxis: Lovenox   #CODE STATUS: DNR/DNI  -We discussed with the patient the risks/benefits of intubation and CPR, and she declined. She acknowledged understanding of her code status.   Dispo: Disposition is deferred at this time, awaiting improvement of current medical problems.  Anticipated discharge in approximately 1-2 day(s).   The patient does not have a current PCP (Provider Default, MD) and does not know need an [facility] follow-up appointment after discharge.  The patient does have transportation limitations that hinder transportation to clinic appointments.  .Services Needed at time of discharge: Y = Yes, Blank = No PT:   OT:   RN:   Equipment:   Other:     LOS: 1 day   [name] 11/26/2013, 5:56 PM

## 2013-11-26 NOTE — Progress Notes (Signed)
Pt. Was taken off BIPAP by RN & placed on a 6L [place]. Pt. Is doing well at this time & isn't in any distress. Pt. States that she doesn't use BIPAP or CPAP at home QHS but does use O2. BIPAP is at the pt.'s bedside on standby & will be used if needed.

## 2013-11-26 NOTE — Progress Notes (Signed)
  Date: 11/26/2013  Patient name: [name]  Medical record number: [id number]  Date of birth: Nov 10, 1955   I have seen and evaluated [name] and discussed their care with the Residency Team. [name] has severe COPD per PFT's from 12/2008. She also has elevated pul artery pressure at 64 on ECHO 11/2012. She is on home O2, Advair, SPiriva, and albuterol. She had a rather acute onset of dyspnea, increased sleepiness, productive cough. She had an ABG on admission of 7.29/95/60 which is most c/w chronic resp failure. Her mental status was altered but adequate for Bipap and was able to be transitioned to [place] O2 6 L overnight. She was also tx with ABX and 125 solumedrol. On this AM, she is on 4 L and is able to speak in full sentences, c/o HA, but feels better.   NAD, speaks in full sentences, lungs with good air flow and no end exp wheezing. Ext with trace edema B. She deseat to mid 80's when breathing through mouth during chest ausculation.   Assessment and Plan: I have seen and evaluated the patient as outlined above. I agree with the formulated Assessment and Plan as detailed in the residents' admission note, with the following changes:   1. Acute on chronic resp failure - She was both hypoxic and hypercapnic. The hypercapnia appears to be chronic. She has known COPD and this was thought to be 2/2 COPD exac but she responded too quickly (no wheezing on exam today) to be fully c/w a COPD exac. Never the less, she needs tx for COPD exac with PO steroids, ABX, and nebs. Additionally, she likely has OSA and OHS - both would contribute to her chronic CO2 retention. She has been questioned about OSA sxs and she does fall asleep easily during the day. Her PCP elected not to pursue a sleep study bc she refuses to wear a mask. It might be worthwhile to pursue though for information and she might tolerate a nose mask. Would rec setting up as outpt. Weight loss also needs to be encouraged. As for her pul  HTN, she has had a R heart cath in 2012 and thought to be 2/2 COPD and possible OSA and a sleep study and home O2 were rec.   2. Acute on chronic diastolic HF - Pt had not taken lasix for a few days. Resume lasix but at a lower dose. Follow volume status.   She will likely be ready for D/C in 1-2 days. Will need outpt sleep study, weight loss, and smoking cessation.   Bartholomew Crews, MD 8/25/20154:03 PM

## 2013-11-26 NOTE — Progress Notes (Signed)
Notified MD oncall that pt is having pain a 7 in back. Pt requesting pain medication. Pt states tynenol doesn't work. MD stated she would put order in. Will continue to monitor pt. Ranelle Oyster, RN

## 2013-11-26 NOTE — Progress Notes (Signed)
S: Went to eval pt on bipap. Pt doing well had just been transitioned off and on 4L O2 but then having some desats to 86s therefore increased to 6L [place]. Pt in NAD. Having some right sharp chest pain that is reproducible to palpation, no cough, no increased SOB. Pt very concerned about eating.   O:  Filed Vitals:   11/26/13 0000  BP: 110/69  Pulse: 70  Temp:   Resp: 16   General: resting in bed, NAD, very obese HEENT: PERRL, EOMI, no scleral icterus Cardiac: RRR, no rubs, murmurs or gallops, ttp over right sternal border Pulm: some decreased BS in lower bases, some expiratory wheeze and prolonged expiratory phase, moving normal volumes of air Abd: soft, nontender, nondistended, BS present Ext: warm and well perfused, no pedal edema Neuro: alert and oriented X3, cranial nerves II-XII grossly intact  A/P:  Pt off bipap but will keep NPO given possible need for bipap again tonight. Pt made aware of this possibility and amenable although resistant.   -CP most likely MSK in nature given reproducibility, pt has had GERD in past pt declined any GI cocktail or other PPI Rx when offered, admission EKG w/some TWI in v4-V5 which is new compared to 5/15, pt w/o active CP, TIMI score of 2 (ASA and known CAD). Will trend trops, repeat EKG in AM.   Clinton Gallant, MD  PGY-3 Pgr: [phone]

## 2013-11-26 NOTE — Clinical Documentation Improvement (Signed)
  Please clarify if obese in setting of BMI=41.86 kg/m2 can be further specified as one of the diagnoses listed below and document in pn or d/c summary.  Possible Clinical conditions  Morbid Obesity W/ BMI  Underweight w/BMI  Other condition___________________  Cannot clinically determine _____________  Risk Factors: Sign & Symptoms: Acute hypercarbic/hypoxic Resp Failure, CHF, Obese per pn 11/26/13 Diagnostics:  Treatment Monitoring  Thank You, Heloise Beecham ,RN Clinical Documentation Specialist:  [place] Information Management

## 2013-11-26 NOTE — Progress Notes (Signed)
Pt transferred from [facility]. Pt is A&Ox4. Pt has bruising to bilateral arms. Pt is from home. Pt oriented to unit and room. Will continue to monitor pt. Ranelle Oyster, RN

## 2013-11-26 NOTE — Clinical Documentation Improvement (Signed)
Please clarify the CHF (acuity and type) for this admission in setting of BNP= 1501 and document in pn or d/c summary   Possible Clinical Conditions?  Chronic Systolic Congestive Heart Failure Chronic Diastolic Congestive Heart Failure Chronic Systolic & Diastolic Congestive Heart Failure Acute Systolic Congestive Heart Failure Acute Diastolic Congestive Heart Failure Acute Systolic & Diastolic Congestive Heart Failure Acute on Chronic Systolic Congestive Heart Failure Acute on Chronic Diastolic Congestive Heart Failure Acute on Chronic Systolic & Diastolic Congestive Heart Failure Other Condition________________________________________ Cannot Clinically Determine  Supporting Information:  Risk Factors: Acute hypercarbic/hypoxic Resp Failure, CHF,  Signs & Symptoms: CHF (EF 55-60%, 11/07/13), per [facility]: Component      Pro B Natriuretic peptide (BNP)  Latest Ref Rng      0 - 125 pg/mL  11/25/2013      1501.0 (H)   Treatment: furosemide (LASIX) tablet 20 mg [  Thank You, Heloise Beecham ,RN Clinical Documentation Specialist:  [place] Information Management

## 2013-11-27 LAB — BASIC METABOLIC PANEL
ANION GAP: 4 — AB (ref 5–15)
BUN: 19 mg/dL (ref 6–23)
CHLORIDE: 97 meq/L (ref 96–112)
CO2: 43 mEq/L (ref 19–32)
CREATININE: 0.71 mg/dL (ref 0.50–1.10)
Calcium: 9.6 mg/dL (ref 8.4–10.5)
GFR calc non Af Amer: >90 mL/min (ref >90)
Glucose, Bld: 124 mg/dL — ABNORMAL HIGH (ref 70–99)
Potassium: 4.6 mEq/L (ref 3.7–5.3)
Sodium: 144 mEq/L (ref 137–147)

## 2013-11-27 LAB — MRSA PCR SCREENING: MRSA BY PCR: NEGATIVE

## 2013-11-27 MED ORDER — LEVOFLOXACIN 750 MG PO TABS
750.0000 mg | ORAL_TABLET | Freq: Every day | ORAL | Status: DC
  Administered 2013-11-27: 750 mg via ORAL
  Filled 2013-11-27: qty 1

## 2013-11-27 MED ORDER — LEVOFLOXACIN 750 MG PO TABS: 750.0000 mg | ORAL_TABLET | Freq: Every day | ORAL | Status: DC

## 2013-11-27 MED ORDER — TRAMADOL HCL 50 MG PO TABS: 50.0000 mg | ORAL_TABLET | Freq: Four times a day (QID) | ORAL | Status: DC | PRN

## 2013-11-27 MED ORDER — DOCUSATE SODIUM 100 MG PO CAPS
200.0000 mg | ORAL_CAPSULE | Freq: Once | ORAL | Status: AC
  Administered 2013-11-27: 200 mg via ORAL
  Filled 2013-11-27 (×2): qty 2

## 2013-11-27 MED ORDER — FUROSEMIDE 20 MG PO TABS: 40.0000 mg | ORAL_TABLET | Freq: Every day | ORAL | Status: DC

## 2013-11-27 MED ORDER — FUROSEMIDE 40 MG PO TABS
40.0000 mg | ORAL_TABLET | Freq: Every day | ORAL | Status: DC
  Administered 2013-11-27: 40 mg via ORAL
  Filled 2013-11-27 (×2): qty 1

## 2013-11-27 MED ORDER — LEVOFLOXACIN IN D5W 750 MG/150ML IV SOLN: 750.0000 mg | INTRAVENOUS | Status: DC

## 2013-11-27 MED ORDER — PREDNISONE 20 MG PO TABS: 60.0000 mg | ORAL_TABLET | Freq: Every day | ORAL | Status: DC

## 2013-11-27 MED ORDER — FUROSEMIDE 40 MG PO TABS
40.0000 mg | ORAL_TABLET | Freq: Every day | ORAL | Status: DC
  Filled 2013-11-27: qty 1

## 2013-11-27 NOTE — Discharge Summary (Signed)
Name: [name] MRN: [id number] DOB: 1955-12-05 58 y.o. PCP: Provider Default, MD  Date of Admission: 11/25/2013 11:08 AM Date of Discharge: 11/27/2013 Attending Physician: Larey Dresser, MD  Discharge Diagnosis: Principal Problem:   Acute on chronic respiratory failure with hypercapnia Active Problems:   OBESITY   TOBACCO ABUSE   PULMONARY HYPERTENSION, SECONDARY   Chronic diastolic CHF (congestive heart failure)   Right-sided chest pain  Discharge Medications:   Medication List    STOP taking these medications       Aspirin-Caffeine 845-65 MG Pack      TAKE these medications       albuterol 1.25 MG/3ML nebulizer solution  Commonly known as:  ACCUNEB  Take 1 ampule by nebulization every 6 (six) hours as needed for wheezing.     albuterol 108 (90 BASE) MCG/ACT inhaler  Commonly known as:  PROVENTIL HFA;VENTOLIN HFA  Inhale 1 puff into the lungs every 4 (four) hours as needed for wheezing or shortness of breath.     Fluticasone-Salmeterol 500-50 MCG/DOSE Aepb  Commonly known as:  ADVAIR  Inhale 1 puff into the lungs 2 (two) times daily.     furosemide 20 MG tablet  Commonly known as:  LASIX  Take 2 tablets (40 mg total) by mouth daily.     levofloxacin 750 MG tablet  Commonly known as:  LEVAQUIN  Take 1 tablet (750 mg total) by mouth daily.     lovastatin 20 MG tablet  Commonly known as:  MEVACOR  Take 1 tablet (20 mg total) by mouth at bedtime.     Olopatadine HCl 0.2 % Soln  Apply 1 drop to eye 2 (two) times daily.     predniSONE 20 MG tablet  Commonly known as:  DELTASONE  Take 3 tablets (60 mg total) by mouth daily.     tiotropium 18 MCG inhalation capsule  Commonly known as:  SPIRIVA HANDIHALER  Place 1 capsule (18 mcg total) into inhaler and inhale daily.     traMADol 50 MG tablet  Commonly known as:  ULTRAM  Take 1 tablet (50 mg total) by mouth every 6 (six) hours as needed for severe pain.        Disposition and follow-up:     [name] was discharged from [facility] in Stable condition.  At the hospital follow up visit please address:  1.  Pulmonology follow-up: sleep study, pulm rehab, PFTs  2.  CHF: weight log and possible adjustment of Lasix dose  3.  Labs / imaging needed at time of follow-up: none  4.  Pending labs/ test needing follow-up: none  Follow-up Appointments: Follow-up Information   Follow up with Juluis Mire, MD On 12/02/2013. (3:15PM)    Specialty:  Internal Medicine   Contact information:   [address] [place] [address] [phone]       Discharge Instructions: Discharge Instructions   ([place]) Call MD:  Anytime you have any of the following symptoms: 1) 3 pound weight gain in 24 hours or 5 pounds in 1 week 2) shortness of breath, with or without a dry hacking cough 3) swelling in the hands, feet or stomach 4) if you have to sleep on extra pillows at night in order to breathe.    Complete by:  As directed      Call MD for:  difficulty breathing, headache or visual disturbances    Complete by:  As directed      Call  MD for:  extreme fatigue    Complete by:  As directed      Diet - low sodium heart healthy    Complete by:  As directed      Increase activity slowly    Complete by:  As directed            Consultations:    Procedures Performed:  Dg Chest 2 View  11/25/2013   CLINICAL DATA:  Shortness of breath, chest tightness, history COPD, pulmonary hypertension, coronary artery disease, smoking, CHF  EXAM: CHEST  2 VIEW  COMPARISON:  08/19/2013  FINDINGS: Enlargement of cardiac silhouette with pulmonary vascular congestion.  Mediastinal contour stable.  Slightly decreased interstitial prominence since previous exam.  No definite acute pulmonary edema, segmental consolidation, pleural effusion, or pneumothorax.  Bones appear demineralized with endplate spur formation lower thoracic spine.  Surgical clips at perigastric region.   IMPRESSION: Enlargement of cardiac silhouette with chronic pulmonary vascular congestion.  No acute pulmonary edema or consolidation identified.   Electronically Signed   By: Lavonia Dana M.D.   On: 11/25/2013 12:41    Admission HPI: Ms. Gammage is 58 year old with COPD, CHF (EF 55-60%, 11/07/13), CAD, who presents today with shortness of breath. Interview was limited by her BiPAP mask.   She first noted symptoms on Saturday after coming home from seeing a friend. She was able to sleep through the night and slept most of Sunday. This morning, as she stepped into the shower, her symptoms worsened to the point where she needed to come into the ED.   For her home COPD medications, she takes Advair x 2/day, Spiriva x 1/day, and albuterol. She is inconsistent with her albuterol, but on her worst day, she uses it three times. She was hospitalized 3/29-4/1 for acute-on-chronic respiratory failure and scheduled to follow-up with pulmonology but did not make it to the appointment. She was last seen in clinic on 5/6 by Dr. Alice Rieger and noted her non-adherence with Symbicort since it damaged her upper lip and encouraged her to attend the appointment.   Associated with this shortness of breath is a productive cough with white-colored sputum. She also notes leg swelling since she has not taken her Lasix since Saturday. She denies fever, chills, nausea, chest pain, abdominal pain, vomiting.   In the ED, ABG showed pH 7.29, CO2 95, O2 60, bicarb 46 and was started on BiPAP.    Hospital Course by problem list:  #Acute-on-chronic respiratory failure with hypercapnia: Likely related to COPD exacerbation in the setting of her CHF and pulmonary HTN. She was treated with BiPAP before being transitioned oxygen by nasal cannula weaned down to her home 4L. Once she could tolerate PO intake, she received prednisone 36m x 5 days & Levaquin 725 mg x 5 days (of which 3 days completed inpatient) and was encouraged to reduce her  smoking.   #CHF: Lasix was held given her SBP trended 90-120s. As she began tolerating PO intake, Lasix was restarted as she had not taken it for 3 days. She reported inconsistent use at home and was encouraged to weigh herself and to call the clinic should her weight have increased >3lbs/day or >5lbs/week. Her weight at discharge was 247 lbs, admission was 245 lbs.   #Right-sided chest pain: Likely MSK given reproducibility on palpation. Troponins negative x 3 and stable EKG findings were reassuring to rule out ACS. She felt relief with tramadol 572mq6h and will need a refill of it at her next  office visit.   #OSA: She had never completed a sleep study before but advised to arrange with the help of her PCP.    Discharge Vitals:   BP 135/79  Pulse 70  Temp(Src) 98.4 F (36.9 C) (Oral)  Resp 20  Ht _0  (1.651 m)  Wt 247 lb 8 oz (112.265 kg)  BMI 41.19 kg/m2  SpO2 92%  Discharge Labs:  No results found for this or any previous visit (from the past 24 hour(s)).  Signed: Charlott Rakes, MD 12/01/2013, 8:41 PM    Services Ordered on Discharge: None Equipment Ordered on Discharge: None

## 2013-11-27 NOTE — Progress Notes (Signed)
Subjective: Yesterday, she complained of back pain/R chest pain that did not respond to Tylenol. She was given tramadol 5m for pain and felt much better. She is on 4L [place] with O2 sats trending in low 90s. As for her Lasix dose, she has reported different dosages.   Objective: Vital signs in last 24 hours: Filed Vitals:   11/26/13 2147 11/27/13 0149 11/27/13 0531 11/27/13 1349  BP: 139/64  141/70 135/79  Pulse: 65  60 70  Temp: 98.6 F (37 C)  98.4 F (36.9 C) 98.4 F (36.9 C)  TempSrc: Oral  Oral Oral  Resp:   16 20  Height: _0  (1.651 m)     Weight: 245 lb (111.131 kg) 241 lb 12.8 oz (109.68 kg) 247 lb 8 oz (112.265 kg)   SpO2: 92%  93% 92%   Weight change: 0 lb (0 kg)  Intake/Output Summary (Last 24 hours) at 11/27/13 1557 Last data filed at 11/27/13 1300  Gross per 24 hour  Intake    820 ml  Output      0 ml  Net    820 ml   General: resting in bed on 4L O2 by [place]  HEENT: PERRL, EOMI, no scleral icterus  Cardiac: RRR, no rubs, murmurs or gallops  Pulm: bibasilar crackles, no wheezes, rales, or rhonchi  Abd: soft, nontender, nondistended, BS present  Ext: warm and well perfused, trace pedal edema bilaterally  Neuro: responding to our questions appropriately   Lab Results: Basic Metabolic Panel:  Recent Labs Lab 11/26/13 0051 11/27/13 0635  NA 143 144  K 4.7 4.6  CL 98 97  CO2 41* 43*  GLUCOSE 150* 124*  BUN 15 19  CREATININE 0.65 0.71  CALCIUM 9.4 9.6   CBC:  Recent Labs Lab 11/25/13 1113 11/26/13 0051  WBC 7.2 6.1  HGB 14.6 13.8  HCT 47.7* 44.7  MCV 100.0 97.4  PLT 135* 143*   Medications: I have reviewed the patient's current medications. Scheduled Meds: . enoxaparin (LOVENOX) injection  40 mg Subcutaneous Q24H  . furosemide  40 mg Oral Daily  . levofloxacin  750 mg Oral Daily  . mometasone-formoterol  2 puff Inhalation BID  . olopatadine  1 drop Both Eyes BID  . predniSONE  60 mg Oral Daily  . simvastatin  10 mg Oral q1800  .  sodium chloride  3 mL Intravenous Q12H  . sodium chloride  3 mL Intravenous Q12H  . tiotropium  18 mcg Inhalation Daily   Continuous Infusions:  PRN Meds:.acetaminophen, acetaminophen, ipratropium-albuterol, traMADol Assessment/Plan:  Ms. PPlamondonis 58year old with COPD, CHF, pulmonary HTN, CAD, tobacco abuse who was hospitalized for acute-on-chronic hypoxic hypercarbic respiratory failure with right-sided chest pain likely MSK.   #Acute-on-chronic hypoxic hypercarbic respiratory failure: Likely multifactorial: poorly controlled COPD & OSA coupled with pulmonary HTN. She is doing better today and stable for discharge. -Continue prednisone 674mto complete 5-day course -Continue levofloxacin 72529mo complete 5-day course -Will need outpatient follow-up with pulmonology for pulm rehab, PFTs, and sleep study for OSA  #R musculoskeletal chest pain: She responded to Tramadol 57m61md felt much better.    #CAD: Continue simvastatin 10mg88mCHF: She will be discharged on 40mg 63m plan to follow-up in our clinic on Monday to reassessment of dose. -Educate her on how to check weights and call should she need to increase her dose  #FEN:  -Diet: Heart Healthy   #DVT prophylaxis: Lovenox   #CODE STATUS: DNR/DNI  Dispo: Disposition is deferred at this time, awaiting improvement of current medical problems.  Anticipated discharge in approximately 1 day(s).   The patient does have a current PCP (Provider Default, MD) and does need an [facility] hospital follow-up appointment after discharge.  The patient does have transportation limitations that hinder transportation to clinic appointments.  .Services Needed at time of discharge: Y = Yes, Blank = No PT:   OT:   RN:   Equipment:   Other:     LOS: 2 days   Charlott Rakes, MD 11/27/2013, 3:57 PM

## 2013-11-27 NOTE — Progress Notes (Signed)
Nsg Discharge Note  Admit Date:  11/25/2013 Discharge date: 11/27/2013   Marlinda Miranda to be D/C'd Home per MD order.  AVS completed.  Copy for chart, and copy for patient signed, and dated. Patient/caregiver able to verbalize understanding.  Discharge Medication:   Medication List    STOP taking these medications       Aspirin-Caffeine 845-65 MG Pack      TAKE these medications       albuterol 1.25 MG/3ML nebulizer solution  Commonly known as:  ACCUNEB  Take 1 ampule by nebulization every 6 (six) hours as needed for wheezing.     albuterol 108 (90 BASE) MCG/ACT inhaler  Commonly known as:  PROVENTIL HFA;VENTOLIN HFA  Inhale 1 puff into the lungs every 4 (four) hours as needed for wheezing or shortness of breath.     Fluticasone-Salmeterol 500-50 MCG/DOSE Aepb  Commonly known as:  ADVAIR  Inhale 1 puff into the lungs 2 (two) times daily.     furosemide 20 MG tablet  Commonly known as:  LASIX  Take 2 tablets (40 mg total) by mouth daily.     levofloxacin 750 MG tablet  Commonly known as:  LEVAQUIN  Take 1 tablet (750 mg total) by mouth daily.     lovastatin 20 MG tablet  Commonly known as:  MEVACOR  Take 1 tablet (20 mg total) by mouth at bedtime.     Olopatadine HCl 0.2 % Soln  Apply 1 drop to eye 2 (two) times daily.     predniSONE 20 MG tablet  Commonly known as:  DELTASONE  Take 3 tablets (60 mg total) by mouth daily.     tiotropium 18 MCG inhalation capsule  Commonly known as:  SPIRIVA HANDIHALER  Place 1 capsule (18 mcg total) into inhaler and inhale daily.     traMADol 50 MG tablet  Commonly known as:  ULTRAM  Take 1 tablet (50 mg total) by mouth every 6 (six) hours as needed for severe pain.        Discharge Assessment: Filed Vitals:   11/27/13 1349  BP: 135/79  Pulse: 70  Temp: 98.4 F (36.9 C)  Resp: 20   Skin clean, dry and intact without evidence of skin break down, no evidence of skin tears noted. IV catheter discontinued intact.  Site without signs and symptoms of complications - no redness or edema noted at insertion site, patient denies c/o pain - only slight tenderness at site.  Dressing with slight pressure applied.  D/c Instructions-Education: Discharge instructions given to patient/family with verbalized understanding. D/c education completed with patient/family including follow up instructions, medication list, d/c activities limitations if indicated, with other d/c instructions as indicated by MD - patient able to verbalize understanding, all questions fully answered. Patient instructed to return to ED, call 911, or call MD for any changes in condition.  Patient escorted via [place], and D/C home via private auto.  Dayle Points, RN 11/27/2013 3:39 PM

## 2013-11-27 NOTE — Care Management Note (Addendum)
Page 1 of 1   11/27/2013     4:45:17 PM CARE MANAGEMENT NOTE 11/27/2013  Patient:  [name], [name]   Account Number:  [id number]  Date Initiated:  11/26/2013  Documentation initiated by:  Marvetta Gibbons  Subjective/Objective Assessment:   Pt admitted with acute resp. failure  patient has oxygen at home and has her oxygen in her room.     Action/Plan:   PTA pt lived at home with family- followed by the outpt IM clinic   Anticipated DC Date:  11/27/2013   Anticipated DC Plan:  [place]  CM consult      Choice offered to / List presented to:             Status of service:  Completed, signed off Medicare Important Message given?  NA - LOS <3 / Initial given by admissions (If response is "NO", the following Medicare IM given date fields will be blank) Date Medicare IM given:   Medicare IM given by:   Date Additional Medicare IM given:   Additional Medicare IM given by:    Discharge Disposition:  HOME/SELF CARE  Per UR Regulation:  Reviewed for med. necessity/level of care/duration of stay  If discussed at [place] of Stay Meetings, dates discussed:    Comments:  11/27/13 [place], BSN [phone] patient dc today, patient has home oxygen and she had her oxygen tank in her room.

## 2013-11-27 NOTE — Progress Notes (Signed)
  Date: 11/27/2013  Patient name: [name]  Medical record number: [id number]  Date of birth: [birth year]   This patient has been seen and the plan of care was discussed with the house staff. Please see their note for complete details. I concur with their findings with the following additions/corrections: Pt feeling better today and is on home O2 of 4 L. Lungs have great air flow, no wheezing, and B inspiratory crackles B. Ms Kilty is quite anxious to be D/C'd today. She had not taken her lasix for a few days prior to admit and we had not Rx'd it on admit as she was unstable and no overt pul edema. It was resumed yesterday. Therefore, Ok to D/C on lasix and close F/U. She will eventually need pul/cardiac rehab. Sleep study. Smoking cessation. Tobacco cessation. Repeat PFTs.   Bartholomew Crews, MD 11/27/2013, 2:41 PM

## 2013-11-27 NOTE — Discharge Instructions (Signed)
Thank you for trusting [place] with your medical care!  You were hospitalized for acute respiratory failure and were treated with BiPAP and your home medications. Please continue taking prednisone and levaquin once a day until you run out of tablets.  For your heart failure, please take Lasix 71m every morning. Also weigh yourself and write them down each day, and if you notice your weight has gone up by 3 lbs in one day or 5 lbs in one week, please call the clinic ([phone] so that we can help you avoid coming into the hospital.

## 2013-12-02 ENCOUNTER — Encounter: Payer: Self-pay | Admitting: Internal Medicine

## 2013-12-02 ENCOUNTER — Ambulatory Visit (INDEPENDENT_AMBULATORY_CARE_PROVIDER_SITE_OTHER): Payer: Commercial Managed Care - HMO | Admitting: Internal Medicine

## 2013-12-02 VITALS — BP 107/81 | HR 71 | Temp 98.6°F | Ht 65.0 in | Wt 254.2 lb

## 2013-12-02 DIAGNOSIS — J449 Chronic obstructive pulmonary disease, unspecified: Secondary | ICD-10-CM

## 2013-12-02 DIAGNOSIS — I5032 Chronic diastolic (congestive) heart failure: Secondary | ICD-10-CM

## 2013-12-02 DIAGNOSIS — F17200 Nicotine dependence, unspecified, uncomplicated: Secondary | ICD-10-CM

## 2013-12-02 DIAGNOSIS — Z23 Encounter for immunization: Secondary | ICD-10-CM

## 2013-12-02 DIAGNOSIS — G4730 Sleep apnea, unspecified: Secondary | ICD-10-CM

## 2013-12-02 DIAGNOSIS — I509 Heart failure, unspecified: Secondary | ICD-10-CM

## 2013-12-02 DIAGNOSIS — Z0000 Encounter for general adult medical examination without abnormal findings: Secondary | ICD-10-CM

## 2013-12-02 NOTE — Progress Notes (Signed)
Patient ID: Whitney Hillegass, female   DOB: 03-03-1956, 58 y.o.   MRN: [id number]     Subjective:   Patient ID: Roshanda Balazs female   DOB: 1955-09-25 59 y.o.   MRN: [id number]  HPI: Ms.Monnica Amanie Mcculley is a 58 y.o. 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   She was hospitalized from 8/24-8/26 for acute on chronic respiratory failure thought to be due to COPD exacerbation in setting of chronic diastolic CHF and pulmonary hypertension. She was instructed to take prednisone 60 mg and levaquin for 2 days which she reports completing. She is compliant with taking spiriva and advair with normal rescue inhaler usage. She has been taking 40 mg of furosemide in the morning and 20 mg at night which she has not taken today because she was going to come to the doctor. She has noticed bilateral LE swelling that worsened today with no orthopnea or fatigue. Her weight on discharge was 247 lb and today is 254 lb. She denies dietary indiscretion. She denies fever, chills, cough, dyspnea, wheezing, or chest tightness. She continues to be on 4L home oxygen and smokes 0.5 pack per day. She declines sleep study because she does not want to wear a mask when she sleeps.    Past Medical History  Diagnosis Date  . Coronary artery disease     S/P PCI of LAD with DES (12/2008). Total occlusion of RCA noted at that time., medically managed. ACS ruled out 03/2009 with Lexiscan myoview . Followed by [facility].  . Pulmonary hypertension     2-D Echo (78/2956) - Systolic pressure was moderately increased. PA peak pressure  70mHg. 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 Followed by Dr. WJoya Gaskins([facility] Heckler  . Diastolic dysfunction     2-D Echo (12/2008)  - Normal LV Systolic funciton with EF 60-65%. Grade 1 diastolid dysfunction. No regional wall motion abnormalities. Moderate pulmonary HTN with PA peak pressure 563mg.  . Marland KitchenOPD (chronic obstructive pulmonary disease)     Severe. Gold Stage IV.  PFTs (12/2008) - severe obstructive airway disease. Active tobacco use. Requires 4L O2 at home.  . Pulmonary nodule, right     Small right middle lobe nodule. Stable as of 12/2008.  . Marland Kitchenleep apnea     Presumptive. No documented sleep studies.   . Prediabetes 12/2008    HgbA1c 6.4 (12/2008)  . Hx MRSA infection     Recurrent MRSA thigh abscesses.  . Tobacco abuse     Ongoing.  . Obesity   . Hyperlipidemia   . GERD (gastroesophageal reflux disease)     S/P Nissen fundoplication.  . Shortness of breath   . CHF (congestive heart failure)    Current Outpatient Prescriptions  Medication Sig Dispense Refill  . albuterol (ACCUNEB) 1.25 MG/3ML nebulizer solution Take 1 ampule by nebulization every 6 (six) hours as needed for wheezing.      . Marland Kitchenlbuterol (PROVENTIL HFA;VENTOLIN HFA) 108 (90 BASE) MCG/ACT inhaler Inhale 1 puff into the lungs every 4 (four) hours as needed for wheezing or shortness of breath.  1 Inhaler  11  . Fluticasone-Salmeterol (ADVAIR) 500-50 MCG/DOSE AEPB Inhale 1 puff into the lungs 2 (two) times daily.      . furosemide (LASIX) 20 MG tablet  Take 2 tablets (40 mg total) by mouth daily.  90 tablet  3  . levofloxacin (LEVAQUIN) 750 MG tablet Take 1 tablet (750 mg total) by mouth daily.  2 tablet  0  . lovastatin (MEVACOR) 20 MG tablet Take 1 tablet (20 mg total) by mouth at bedtime.  30 tablet  11  . Olopatadine HCl 0.2 % SOLN Apply 1 drop to eye 2 (two) times daily.  2.5 mL  0  . predniSONE (DELTASONE) 20 MG tablet Take 3 tablets (60 mg total) by mouth daily.  3 tablet  0  . tiotropium (SPIRIVA HANDIHALER) 18 MCG inhalation capsule Place 1 capsule (18 mcg total) into inhaler and inhale daily.  30 capsule  12  . traMADol (ULTRAM) 50 MG  tablet Take 1 tablet (50 mg total) by mouth every 6 (six) hours as needed for severe pain.  15 tablet  0   No current facility-administered medications for this visit.   Family History  Problem Relation Age of Onset  . Heart disease Mother 66    Deceased from MI at 52yo  . Hypertension Mother   . Heart disease Father 65    Deceased of MI age 10yo  . Hypertension Father   . Hypertension Brother   . Lung cancer      Grandmother   History   Social History  . Marital Status: Single    Spouse Name: N/A    Number of Children: N/A  . Years of Education: N/A   Social History Main Topics  . Smoking status: Current Every Day Smoker -- 0.50 packs/day for 40 years    Types: Cigarettes  . Smokeless tobacco: Never Used  . Alcohol Use: No  . Drug Use: No  . Sexual Activity: None   Other Topics Concern  . None   Social History Narrative   Formerly worked as a Scientist, water quality, now disabled.   Divorced.   2 grown children.   Lives with her grandson.   Review of Systems: Review of Systems  Constitutional: Negative for fever, chills and malaise/fatigue.       Weight gain  Respiratory: Negative for cough, sputum production, shortness of breath and wheezing.   Cardiovascular: Positive for leg swelling (bilateral LE ). Negative for chest pain and orthopnea.  Gastrointestinal: Positive for constipation. Negative for nausea, vomiting, abdominal pain and diarrhea.  Genitourinary: Negative for dysuria, urgency and frequency.  Neurological: Negative for dizziness.    Objective:  Physical Exam: Filed Vitals:   12/02/13 1521  BP: 107/81  Pulse: 71  Temp: 98.6 F (37 C)  TempSrc: Oral  Height: _0  (1.651 m)  Weight: 254 lb 3.2 oz (115.304 kg)  SpO2: 96%    Physical Exam  Constitutional: She is oriented to person, place, and time. She appears well-developed and well-nourished. No distress.  HENT:  Head: Normocephalic and atraumatic.  Eyes: EOM are normal.  Neck: Normal range of  motion. Neck supple.  Cardiovascular: Normal rate and regular rhythm.   Pulmonary/Chest: Effort normal and breath sounds normal.  Breathing on 4 L oxygen   Abdominal: Soft. Bowel sounds are normal. She exhibits no distension. There is no tenderness. There is no rebound and no guarding.  Musculoskeletal: Normal range of motion. She exhibits edema (+1 b/l pitting LE edema ). She exhibits no tenderness.  Neurological: She is alert and oriented to person, place, and time.  Skin: Skin is warm and dry. No rash noted. She is not diaphoretic. No erythema. No pallor.  Psychiatric: She has a normal mood and affect. Her behavior is normal. Judgment and thought content normal.    Assessment & Plan:   Please see problem list for problem-based assessment and plan

## 2013-12-02 NOTE — Patient Instructions (Addendum)
-  Start taking lasix 40 mg in the AM and 20 mg in PM, if you start to swell, take 40 mg twice a day -Glad you are doing better, Dr. Alice Rieger will see you back in 1 month  General Instructions:   Please bring your medicines with you each time you come to clinic.  Medicines may include prescription medications, over-the-counter medications, herbal remedies, eye drops, vitamins, or other pills.   Progress Toward Treatment Goals:  Treatment Goal 04/24/2013  Stop smoking smoking the same amount  Prevent falls unchanged    Self Care Goals & Plans:  Self Care Goal 12/02/2013  Manage my medications take my medicines as prescribed; bring my medications to every visit; refill my medications on time; follow the sick day instructions if I am sick  Monitor my health keep track of my weight  Eat healthy foods eat more vegetables; eat fruit for snacks and desserts  Be physically active find an activity I enjoy  Stop smoking -    No flowsheet data found.   Care Management & Community Referrals:  Referral 04/24/2013  Referrals made for care management support [facility] case management program  Referrals made to community resources transportation

## 2013-12-03 NOTE — Assessment & Plan Note (Addendum)
Assessment: Pt with chronic grade 1 diastolic CHF with recent hospitalization for exacerbation who presents with mild volume overload.   Plan:  -Pt instructed to continue furosemide 40 mg in AM and 20 in PM and if has fluid retention to take 40 mg BID (pt reported had not taken today)  -Pt instructed to follow salt and fluid restricted diet and monitor daily weight at home  -Consider starting aspirin 81 mg daily

## 2013-12-03 NOTE — Assessment & Plan Note (Signed)
Assessment: Pt with presumed OSA in setting of morbid obesity and pulmonary hypertension with no formal diagnosis on sleep study.   Plan:  -Pt declined sleep study testing

## 2013-12-03 NOTE — Assessment & Plan Note (Signed)
Assessment: Pt is a chronic smoker currently smoking 0.5 ppd who presents with no motivation to quit smoking cigarettes.   Plan:  -Encourage tobacco cessation -Pt aware of resources to aid in tobacco cessation

## 2013-12-03 NOTE — Assessment & Plan Note (Addendum)
Assessment: Pt with Gold Stage IV COPD on home oxygen compliant with maintenance therapy with recent hospitalization for exacerbation who presents with improved symptoms after corticosteroid and antibiotic therapy.   Plan:  -Measure SpO2 ---> 95% on 4 L O2, continue 4 L oxygen therapy   -Continue spiriva 18 mcg daily and advair 1 puff BID  -Continue albuterol nebulizer Q 6 hr and inhaler Q 4 hr PRN acute bronchospasm -Pt counseled on tobacco cessation  -Consider updated PFT (last one in 2010) and pulmonary rehabilitation

## 2013-12-03 NOTE — Assessment & Plan Note (Signed)
Pt received annual influenza vaccination today on 12/02/13.

## 2013-12-05 NOTE — Progress Notes (Signed)
INTERNAL MEDICINE TEACHING ATTENDING ADDENDUM - Corrado Hymon, MD: I reviewed and discussed at the time of visit with the resident Dr. Rabbani, the patient's medical history, physical examination, diagnosis and results of pertinent tests and treatment and I agree with the patient's care as documented.  

## 2013-12-22 ENCOUNTER — Emergency Department (HOSPITAL_COMMUNITY): Payer: Medicare HMO

## 2013-12-22 ENCOUNTER — Emergency Department (HOSPITAL_COMMUNITY)
Admission: EM | Admit: 2013-12-22 | Discharge: 2013-12-22 | Disposition: A | Discharge status: Home / Self Care | Payer: Medicare HMO | Attending: Emergency Medicine | Admitting: Emergency Medicine

## 2013-12-22 ENCOUNTER — Encounter (HOSPITAL_COMMUNITY): Payer: Self-pay | Admitting: Emergency Medicine

## 2013-12-22 DIAGNOSIS — G4730 Sleep apnea, unspecified: Secondary | ICD-10-CM | POA: Insufficient documentation

## 2013-12-22 DIAGNOSIS — I2510 Atherosclerotic heart disease of native coronary artery without angina pectoris: Secondary | ICD-10-CM | POA: Insufficient documentation

## 2013-12-22 DIAGNOSIS — Z8614 Personal history of Methicillin resistant Staphylococcus aureus infection: Secondary | ICD-10-CM | POA: Diagnosis not present

## 2013-12-22 DIAGNOSIS — J441 Chronic obstructive pulmonary disease with (acute) exacerbation: Secondary | ICD-10-CM | POA: Insufficient documentation

## 2013-12-22 DIAGNOSIS — I2789 Other specified pulmonary heart diseases: Secondary | ICD-10-CM | POA: Insufficient documentation

## 2013-12-22 DIAGNOSIS — R0602 Shortness of breath: Secondary | ICD-10-CM | POA: Diagnosis present

## 2013-12-22 DIAGNOSIS — Z8719 Personal history of other diseases of the digestive system: Secondary | ICD-10-CM | POA: Insufficient documentation

## 2013-12-22 DIAGNOSIS — F17200 Nicotine dependence, unspecified, uncomplicated: Secondary | ICD-10-CM | POA: Diagnosis not present

## 2013-12-22 DIAGNOSIS — Z79899 Other long term (current) drug therapy: Secondary | ICD-10-CM | POA: Insufficient documentation

## 2013-12-22 DIAGNOSIS — E785 Hyperlipidemia, unspecified: Secondary | ICD-10-CM | POA: Diagnosis not present

## 2013-12-22 DIAGNOSIS — I509 Heart failure, unspecified: Secondary | ICD-10-CM | POA: Diagnosis not present

## 2013-12-22 DIAGNOSIS — E669 Obesity, unspecified: Secondary | ICD-10-CM | POA: Insufficient documentation

## 2013-12-22 DIAGNOSIS — IMO0002 Reserved for concepts with insufficient information to code with codable children: Secondary | ICD-10-CM | POA: Insufficient documentation

## 2013-12-22 DIAGNOSIS — Z8679 Personal history of other diseases of the circulatory system: Secondary | ICD-10-CM

## 2013-12-22 DIAGNOSIS — Z8709 Personal history of other diseases of the respiratory system: Secondary | ICD-10-CM

## 2013-12-22 DIAGNOSIS — R0600 Dyspnea, unspecified: Secondary | ICD-10-CM

## 2013-12-22 LAB — BASIC METABOLIC PANEL
BUN: 10 mg/dL (ref 6–23)
CO2: >45 mEq/L (ref 19–32)
Calcium: 9.2 mg/dL (ref 8.4–10.5)
Chloride: 94 mEq/L — ABNORMAL LOW (ref 96–112)
Creatinine, Ser: 0.68 mg/dL (ref 0.50–1.10)
GFR calc Af Amer: >90 mL/min (ref >90)
GFR calc non Af Amer: >90 mL/min (ref >90)
Glucose, Bld: 98 mg/dL (ref 70–99)
Potassium: 4.3 mEq/L (ref 3.7–5.3)
Sodium: 144 mEq/L (ref 137–147)

## 2013-12-22 LAB — CBC
HCT: 45 % (ref 36.0–46.0)
Hemoglobin: 13.7 g/dL (ref 12.0–15.0)
MCH: 30.2 pg (ref 26.0–34.0)
MCHC: 30.4 g/dL (ref 30.0–36.0)
MCV: 99.1 fL (ref 78.0–100.0)
PLATELETS: 134 10*3/uL — AB (ref 150–400)
RBC: 4.54 MIL/uL (ref 3.87–5.11)
RDW: 13.5 % (ref 11.5–15.5)
WBC: 6.1 10*3/uL (ref 4.0–10.5)

## 2013-12-22 LAB — I-STAT TROPONIN, ED: TROPONIN I, POC: 0.02 ng/mL (ref 0.00–0.08)

## 2013-12-22 LAB — PRO B NATRIURETIC PEPTIDE: PRO B NATRI PEPTIDE: 861.4 pg/mL — AB (ref 0–125)

## 2013-12-22 LAB — D-DIMER, QUANTITATIVE (NOT AT ARMC): D-Dimer, Quant: <0.27 ug/mL-FEU (ref 0.00–0.48)

## 2013-12-22 NOTE — ED Notes (Signed)
Patient transported to X-ray

## 2013-12-22 NOTE — ED Notes (Signed)
Patient returned from xray, monitor reapplied

## 2013-12-22 NOTE — ED Notes (Signed)
Harvie Heck, PA at bedside

## 2013-12-22 NOTE — ED Provider Notes (Signed)
CSN: [id number]     Arrival date & time 12/22/13  1237 History   First MD Initiated Contact with Patient 12/22/13 1303     Chief Complaint  Patient presents with  . Shortness of Breath     (Consider location/radiation/quality/duration/timing/severity/associated sxs/prior Treatment) HPI Comments: This is a 58 year old female with past medical history significant for COPD, coronary artery disease status post stenting, pulmonary hypertension, congestive heart failure, presented to the ED for shortness of breath, onset 4 days ago.  The patient reports onset of symptoms triggered by a neighbor using chemicals to clean and spray for bugs. She reports increase in symptoms today. She reports associated lower extremity swelling right chronically greater than left. She reports taking increase in Lasix yesterday, last dose last night without full resolution of symptoms. Patient The patient is on home oxygen 4 L [place] all times. Increase to 4.5 L today. Patient reports right lower chest discomfort worsened with cough. PCP: Jessee Avers, MD   Patient is a 58 y.o. female presenting with shortness of breath. The history is provided by the patient. No language interpreter was used.  Shortness of Breath Associated symptoms: chest pain   Associated symptoms: no cough and no fever     Past Medical History  Diagnosis Date  . Coronary artery disease     S/P PCI of LAD with DES (12/2008). Total occlusion of RCA noted at that time., medically managed. ACS ruled out 03/2009 with Lexiscan myoview . Followed by [place].  . Pulmonary hypertension     2-D Echo (60/1093) - Systolic pressure was moderately increased. PA peak pressure  79mHg. 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 Followed by Dr. WJoya Gaskins(Velora Heckler  . Diastolic dysfunction     2-D Echo (12/2008) - Normal LV Systolic funciton with EF 60-65%.  Grade 1 diastolid dysfunction. No regional wall motion abnormalities. Moderate pulmonary HTN with PA peak pressure 590mg.  . Marland KitchenOPD (chronic obstructive pulmonary disease)     Severe. Gold Stage IV.  PFTs (12/2008) - severe obstructive airway disease. Active tobacco use. Requires 4L O2 at home.  . Pulmonary nodule, right     Small right middle lobe nodule. Stable as of 12/2008.  . Marland Kitchenleep apnea     Presumptive. No documented sleep studies.   . Prediabetes 12/2008    HgbA1c 6.4 (12/2008)  . Hx MRSA infection     Recurrent MRSA thigh abscesses.  . Tobacco abuse     Ongoing.  . Obesity   . Hyperlipidemia   . GERD (gastroesophageal reflux disease)     S/P Nissen fundoplication.  . Shortness of breath   . CHF (congestive heart failure)    Past Surgical History  Procedure Laterality Date  . Total abdominal hysterectomy w/ bilateral salpingoophorectomy    . Nissen fundoplication     Family History  Problem Relation Age of Onset  . Heart disease Mother 4747  Deceased from MI at 59yo. Hypertension Mother   . Heart disease Father [id number]  Deceased of MI age 59yo. Hypertension Father   . Hypertension Brother   . Lung cancer      Grandmother   History  Substance Use Topics  . Smoking status: Current Every Day Smoker -- 0.50 packs/day for 40 years    Types: Cigarettes  . Smokeless tobacco: Never Used  . Alcohol Use: No   OB History  Grav Para Term Preterm Abortions TAB SAB Ect Mult Living                 Review of Systems  Constitutional: Negative for fever and chills.  Respiratory: Positive for shortness of breath. Negative for cough.   Cardiovascular: Positive for chest pain and leg swelling. Negative for palpitations.  Skin: Negative for color change and wound.      Allergies  Fluconazole  Home Medications   Prior to Admission medications   Medication Sig Start Date End Date Taking? Authorizing Provider  albuterol (ACCUNEB) 1.25 MG/3ML nebulizer solution Take 1  ampule by nebulization every 6 (six) hours as needed for wheezing.    Historical Provider, MD  albuterol (PROVENTIL HFA;VENTOLIN HFA) 108 (90 BASE) MCG/ACT inhaler Inhale 1 puff into the lungs every 4 (four) hours as needed for wheezing or shortness of breath. 06/13/13   Jessee Avers, MD  Fluticasone-Salmeterol (ADVAIR) 500-50 MCG/DOSE AEPB Inhale 1 puff into the lungs 2 (two) times daily.    Historical Provider, MD  furosemide (LASIX) 20 MG tablet Take 2 tablets (40 mg total) by mouth daily. 11/27/13   Charlott Rakes, MD  lovastatin (MEVACOR) 20 MG tablet Take 1 tablet (20 mg total) by mouth at bedtime. 06/12/13   Jessee Avers, MD  Olopatadine HCl 0.2 % SOLN Apply 1 drop to eye 2 (two) times daily. 08/07/13   Jessee Avers, MD  tiotropium (SPIRIVA HANDIHALER) 18 MCG inhalation capsule Place 1 capsule (18 mcg total) into inhaler and inhale daily. 06/12/13   Jessee Avers, MD  traMADol (ULTRAM) 50 MG tablet Take 1 tablet (50 mg total) by mouth every 6 (six) hours as needed for severe pain. 11/27/13   Charlott Rakes, MD   BP 116/68  Pulse 78  Temp(Src) 98.5 F (36.9 C) (Oral)  Resp 18  Ht _0  (1.651 m)  Wt 254 lb (115.214 kg)  BMI 42.27 kg/m2  SpO2 93% Physical Exam  Nursing note and vitals reviewed. Constitutional: She appears well-developed and well-nourished.  Non-toxic appearance. She appears distressed. Nasal cannula in place.  HENT:  Head: Normocephalic and atraumatic.  Cardiovascular: Normal rate and regular rhythm.   Trace pitting edema to left lower extremity. 1+ pitting edema to right lower extremity.  Pulmonary/Chest: Effort normal. No respiratory distress. She has decreased breath sounds in the right upper field, the right middle field, the right lower field, the left upper field, the left middle field and the left lower field. She has no wheezes. She has no rhonchi. She has no rales. She exhibits no tenderness and no bony tenderness.  Patient is able to speak in complete  sentences. Decrease breath sounds in all lung fields  Abdominal: Soft. There is no tenderness. There is no rigidity, no rebound, no guarding and negative Murphy's sign.  Obese abdomen  Skin: She is not diaphoretic.    ED Course  Procedures (including critical care time) Labs Review Results for orders placed during the hospital encounter of 12/22/13  CBC      Result Value Ref Range   WBC 6.1  4.0 - 10.5 K/uL   RBC 4.54  3.87 - 5.11 MIL/uL   Hemoglobin 13.7  12.0 - 15.0 g/dL   HCT 45.0  36.0 - 46.0 %   MCV 99.1  78.0 - 100.0 fL   MCH 30.2  26.0 - 34.0 pg   MCHC 30.4  30.0 - 36.0 g/dL   RDW 13.5  11.5 - 15.5 %   Platelets 134 (*)  150 - 400 K/uL  BASIC METABOLIC PANEL      Result Value Ref Range   Sodium 144  137 - 147 mEq/L   Potassium 4.3  3.7 - 5.3 mEq/L   Chloride 94 (*) 96 - 112 mEq/L   CO2 >45 (*) 19 - 32 mEq/L   Glucose, Bld 98  70 - 99 mg/dL   BUN 10  6 - 23 mg/dL   Creatinine, Ser 0.68  0.50 - 1.10 mg/dL   Calcium 9.2  8.4 - 10.5 mg/dL   GFR calc non Af Amer >90  >90 mL/min   GFR calc Af Amer >90  >90 mL/min  PRO B NATRIURETIC PEPTIDE      Result Value Ref Range   Pro B Natriuretic peptide (BNP) 861.4 (*) 0 - 125 pg/mL  D-DIMER, QUANTITATIVE      Result Value Ref Range   D-Dimer, Quant <0.27  0.00 - 0.48 ug/mL-FEU  I-STAT TROPOININ, ED      Result Value Ref Range   Troponin i, poc 0.02  0.00 - 0.08 ng/mL   Comment 3            Dg Chest 2 View  12/22/2013   CLINICAL DATA:  Two-day history of shortness of breath; history of CHF, coronary artery disease with stent placement, COPD, tobacco use, and secondary pulmonary hypertension.  EXAM: CHEST  2 VIEW  COMPARISON:  PA and lateral chest of November 25, 2013  FINDINGS: The cardiopericardial silhouette remains enlarged. The pulmonary vascularity remains engorged but is slightly less conspicuous than on the previous study. There is no pleural effusion or alveolar infiltrate. The lungs are mildly hyperinflated. There is  hemidiaphragm flattening. The bony thorax is unremarkable.  IMPRESSION: CHF superimposed upon COPD. There is mild pulmonary interstitial edema. There is no alveolar pneumonia.   Electronically Signed   By: David  Martinique   On: 12/22/2013 15:05    Imaging Review No results found.   EKG Interpretation None      MDM   Final diagnoses:  Dyspnea  History of COPD  History of CHF (congestive heart failure)   Patient presents with increased shortness breath over the last several days history of CHF and COPD no wheezing on exam patient is in no acute distress, questionable CHF versus pneumonia 2 right lower lung. X-ray negative for acute findings chronic COPD and CHF, decrease in vascular congestion since prior, BNP 861 down from 15013 weeks ago. Given age and right-sided chest discomfort d-dimer was ordered, it was negative doubt PE.    4:05 PM Reevaluation patient resting comfortably in room oxygen saturation 97% on 4 L nasal cannula discussed negative workup with the patient as far will ambulate the patient for oxygen saturation. Pulse ox 88% 4L with ambulation.  Plan to discharge home with increase home oxygen and follow up with PCP.   Harvie Heck, PA-C 12/22/13 1742

## 2013-12-22 NOTE — ED Notes (Signed)
Pt reports having copd, wears home o2. Was around someone spraying bug spray on Wednesday and having increase in sob since. spo2 91% at triage.

## 2013-12-22 NOTE — Discharge Instructions (Signed)
Increase your oxygen to 4.5-5L. Call for a follow up appointment with a Family or Primary Care Provider.  Return if Symptoms worsen.   Take medication as prescribed.

## 2013-12-22 NOTE — ED Notes (Addendum)
Pt O2 while ambulating was 91%. Pt O2 then dropped to 88% on 4L of O2.

## 2013-12-23 ENCOUNTER — Other Ambulatory Visit: Payer: Self-pay | Admitting: Internal Medicine

## 2013-12-23 DIAGNOSIS — R928 Other abnormal and inconclusive findings on diagnostic imaging of breast: Secondary | ICD-10-CM

## 2013-12-26 ENCOUNTER — Telehealth: Payer: Self-pay | Admitting: *Deleted

## 2013-12-31 ENCOUNTER — Ambulatory Visit
Admission: RE | Admit: 2013-12-31 | Discharge: 2013-12-31 | Disposition: A | Discharge status: Home / Self Care | Payer: Medicare HMO | Source: Ambulatory Visit | Attending: Internal Medicine | Admitting: Internal Medicine

## 2013-12-31 DIAGNOSIS — R928 Other abnormal and inconclusive findings on diagnostic imaging of breast: Secondary | ICD-10-CM

## 2014-01-01 NOTE — ED Provider Notes (Signed)
Medical screening examination/treatment/procedure(s) were performed by non-physician practitioner and as supervising physician I was immediately available for consultation/collaboration.   EKG Interpretation   Date/Time:  Sunday December 22 2013 12:43:31 EDT Ventricular Rate:  77 PR Interval:  138 QRS Duration: 84 QT Interval:  378 QTC Calculation: 427 R Axis:   93 Text Interpretation:  Normal sinus rhythm with sinus arrhythmia Rightward  axis ST \T\ T wave abnormality, consider inferior ischemia ST \T\ T wave  abnormality, consider anterior ischemia Abnormal ECG ED PHYSICIAN  INTERPRETATION AVAILABLE IN [facility] Confirmed by TEST, Record  ([id number]) on 12/24/2013 7:23:44 AM       Babette Relic, MD 01/01/14 1329

## 2014-01-02 ENCOUNTER — Ambulatory Visit: Payer: Commercial Managed Care - HMO | Admitting: Internal Medicine

## 2014-01-02 ENCOUNTER — Encounter: Payer: Self-pay | Admitting: Internal Medicine

## 2014-01-22 ENCOUNTER — Encounter: Payer: Self-pay | Admitting: Internal Medicine

## 2014-01-22 ENCOUNTER — Ambulatory Visit (INDEPENDENT_AMBULATORY_CARE_PROVIDER_SITE_OTHER): Payer: Commercial Managed Care - HMO | Admitting: Internal Medicine

## 2014-01-22 VITALS — BP 109/62 | HR 83 | Temp 98.0°F | Ht 65.0 in | Wt 261.8 lb

## 2014-01-22 DIAGNOSIS — M79641 Pain in right hand: Secondary | ICD-10-CM | POA: Insufficient documentation

## 2014-01-22 DIAGNOSIS — M79642 Pain in left hand: Secondary | ICD-10-CM | POA: Insufficient documentation

## 2014-01-22 DIAGNOSIS — J9611 Chronic respiratory failure with hypoxia: Secondary | ICD-10-CM

## 2014-01-22 DIAGNOSIS — J449 Chronic obstructive pulmonary disease, unspecified: Secondary | ICD-10-CM

## 2014-01-22 DIAGNOSIS — I5032 Chronic diastolic (congestive) heart failure: Secondary | ICD-10-CM

## 2014-01-22 MED ORDER — FUROSEMIDE 20 MG PO TABS: 40.0000 mg | ORAL_TABLET | Freq: Two times a day (BID) | ORAL | Status: DC

## 2014-01-22 NOTE — Assessment & Plan Note (Signed)
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 Patient is chronic retainer of CO2. Ddx: Physical examination findings do not support, carpal tunnel syndrome. I do not suspect parkinsonism. Endocrine abnormalities can't be excluded, especially given the patient is using Lasix, even though she is noncompliant.   Plan: 1. Labs/imaging: BMP and magnesium level, referral for nerve conduction study. X ray of hands is unlikely to be helpful since pain does not seem arthritic in nature. 2. Therapy: Therapy will depend on the results from tests 3. Follow up: Patient is unlikely to go for his appointment with neurology. We'll try to investigate her problem from [facility] and see if a referral to neurology, might be beneficial.

## 2014-01-22 NOTE — Assessment & Plan Note (Signed)
No evidence of exacerbation currently. Cont with current treatment.

## 2014-01-22 NOTE — Progress Notes (Signed)
Patient ID: [name], female   DOB: [birth year], 58 y.o.   MRN: [id number]   Subjective:   HPI: [name] is a 58 y.o. woman with past medical history of Gold Stage IV COPD on continous 4L O2, chronic grade 1 diastolic CHF, pulmonary hypertension, CAD s/p stenting, hyperlipidemia, and tobacco abuse.   Bilateral hand pain: Patient describes episodes of hand pain or discomfort, which has been present for several years, but is becoming more frequent over the past few months. She describes episodes where her hands become numb and sometimes jerk. She has woken up several times with pain in her hands bilaterally. Sometimes the hands, just become "frozen". Occasionally, it is just one hand, but it does not affect one hand, more than the other. These episodes seem not to be increased by repetitive use of her hands. She's been afraid to lift fragile objects like water glasses due to fear of dropping them. She doesn't feel like her symptoms involve particular fingers of her hands. She has no problems with ambulation. She has no history of arthritis. The pain, or more precisely discomfort, of her hands is not affected by the weather. She has not noticed any relieving factors. She has no similar symptoms in her feet. She has never sought treatment for this problem but she is becoming more distressed and would like something to be done about it. She denies history of muscle cramps.  Kindly see the A&P for the status of the pt's chronic medical problems.  ROS: Constitutional: Denies fever, chills, diaphoresis, appetite change and fatigue.  Respiratory: Stable, shortness of breath has not worsened recently. She continues to use her home O2. No recent fever is increased wheezing, or chest tightness.  CVS: No chest pain, palpitations and leg swelling.  GI: No abdominal pain, nausea, vomiting, bloody stools GU: No dysuria, frequency, hematuria, or flank pain.  MSK: No myalgias, back pain, joint  swelling, arthralgias  Psych: No depression symptoms. No SI or SA.    Objective:  Physical Exam: Filed Vitals:   01/22/14 1402  BP: 109/62  Pulse: 83  Temp: 98 F (36.7 C)  TempSrc: Oral  Height: 5' 5" (1.651 m)  Weight: 261 lb 12.8 oz (118.752 kg)  SpO2: 83%   General: Well nourished. No acute distress. Bluish hue of the lips. HEENT: Normal oral mucosa. MMM.  Lungs: CTA bilaterally. No wheezing. Normal work of breathing. She is on 4 L of oxygen via nasal cannula. Initial reading of her O2 sats was 83%, but improved to 93% when the oxygen was increased to 4 L. Heart: RRR; no extra sounds or murmurs  Abdomen: Non-distended, normal bowel sounds, soft, nontender; no hepatosplenomegaly  Extremities: Bilateral pedal edema, or to the level shin. No joint swelling or tenderness. Phalen and Tinel's sign are both negative. Grip is normal. Sensation is equal, and there is no significant differences in the distribution of the pattern. Pain is not elicited by tapping of the carpal tunnel area. Tips of the fingers look normal. No skin breakdowns or wounds.  I witnesed occasional jerking of the hands at rest.   Neurologic: Normal EOM,  Alert and oriented x3. No obvious neurologic/cranial nerve deficits.  Assessment & Plan:  Discussed case with my attending in the clinic, Dr. Eppie Gibson See problem based charting.

## 2014-01-22 NOTE — Assessment & Plan Note (Signed)
Stable. Cont with O2 4L continously

## 2014-01-22 NOTE — Patient Instructions (Signed)
General Instructions: Please increase Lasix to 40 mg daily  I will order some labs today and send you to for nerve conduction studies.  I will give a you a call  Please come back in 2- 3 months  Please bring your medicines with you each time you come to clinic.  Medicines may include prescription medications, over-the-counter medications, herbal remedies, eye drops, vitamins, or other pills.   Progress Toward Treatment Goals:  Treatment Goal 04/24/2013  Stop smoking smoking the same amount  Prevent falls unchanged    Self Care Goals & Plans:  Self Care Goal 01/22/2014  Manage my medications take my medicines as prescribed; bring my medications to every visit; refill my medications on time  Monitor my health -  Eat healthy foods drink diet soda or water instead of juice or soda; eat more vegetables; eat foods that are low in salt; eat baked foods instead of fried foods; eat fruit for snacks and desserts  Be physically active -  Stop smoking go to the [employer] ([email or web address].info/)    No flowsheet data found.   Care Management & Community Referrals:  Referral 04/24/2013  Referrals made for care management support [facility] case management program  Referrals made to community resources transportation

## 2014-01-22 NOTE — Assessment & Plan Note (Signed)
Wt Readings from Last 5 Encounters:  01/22/14 261 lb 12.8 oz (118.752 kg)  12/22/13 254 lb (115.214 kg)  12/02/13 254 lb 3.2 oz (115.304 kg)  11/27/13 247 lb 8 oz (112.265 kg)  08/21/13 236 lb 8.9 oz (107.3 kg)   Her weight is up by 7 pounds since her last visit on 12/22/2013. Patient reports that she does not take her Lasix consistently due to disturbance with frequent urination. Physical examination reveals bilateral lower extremity edema to the level of the shins. However, her lung exam is normal and doesn't appear to be in respiratory distress. It is clear that she has excess volume is building up gradually. Discussed with the patient about her volume status, and the need to take her Lasix. Advised her to schedule her morning lasix dose earlier the day and avoid taking it in the evening. Plan  - increase lasix to 40 mg bid - patient is not compliant so I doubt she will take even daily - will check bmp today

## 2014-01-23 LAB — BASIC METABOLIC PANEL WITH GFR
BUN: 8 mg/dL (ref 6–23)
CO2: >45 mEq/L — ABNORMAL HIGH (ref 19–32)
Calcium: 8.7 mg/dL (ref 8.4–10.5)
Chloride: 94 mEq/L — ABNORMAL LOW (ref 96–112)
Creat: 0.78 mg/dL (ref 0.50–1.10)
GFR, Est African American: >89 mL/min
GFR, Est Non African American: 83 mL/min
Glucose, Bld: 85 mg/dL (ref 70–99)
Potassium: 4.4 mEq/L (ref 3.5–5.3)
SODIUM: 143 meq/L (ref 135–145)

## 2014-01-23 LAB — MAGNESIUM: MAGNESIUM: 1.9 mg/dL (ref 1.5–2.5)

## 2014-01-26 ENCOUNTER — Inpatient Hospital Stay (HOSPITAL_COMMUNITY)
Admission: EM | Admit: 2014-01-26 | Discharge: 2014-02-02 | DRG: 190 | Disposition: A | Discharge status: Home / Self Care | Payer: Medicare HMO | Attending: Internal Medicine | Admitting: Internal Medicine

## 2014-01-26 ENCOUNTER — Encounter (HOSPITAL_COMMUNITY): Payer: Self-pay | Admitting: Emergency Medicine

## 2014-01-26 ENCOUNTER — Emergency Department (HOSPITAL_COMMUNITY): Payer: Medicare HMO

## 2014-01-26 DIAGNOSIS — I5032 Chronic diastolic (congestive) heart failure: Secondary | ICD-10-CM | POA: Diagnosis present

## 2014-01-26 DIAGNOSIS — E669 Obesity, unspecified: Secondary | ICD-10-CM | POA: Diagnosis present

## 2014-01-26 DIAGNOSIS — Z9981 Dependence on supplemental oxygen: Secondary | ICD-10-CM

## 2014-01-26 DIAGNOSIS — R0602 Shortness of breath: Secondary | ICD-10-CM

## 2014-01-26 DIAGNOSIS — F17200 Nicotine dependence, unspecified, uncomplicated: Secondary | ICD-10-CM

## 2014-01-26 DIAGNOSIS — E874 Mixed disorder of acid-base balance: Secondary | ICD-10-CM | POA: Diagnosis present

## 2014-01-26 DIAGNOSIS — Z801 Family history of malignant neoplasm of trachea, bronchus and lung: Secondary | ICD-10-CM

## 2014-01-26 DIAGNOSIS — Z9861 Coronary angioplasty status: Secondary | ICD-10-CM

## 2014-01-26 DIAGNOSIS — J9620 Acute and chronic respiratory failure, unspecified whether with hypoxia or hypercapnia: Secondary | ICD-10-CM | POA: Diagnosis present

## 2014-01-26 DIAGNOSIS — I272 Other secondary pulmonary hypertension: Secondary | ICD-10-CM | POA: Diagnosis present

## 2014-01-26 DIAGNOSIS — K219 Gastro-esophageal reflux disease without esophagitis: Secondary | ICD-10-CM | POA: Diagnosis present

## 2014-01-26 DIAGNOSIS — F17210 Nicotine dependence, cigarettes, uncomplicated: Secondary | ICD-10-CM | POA: Diagnosis present

## 2014-01-26 DIAGNOSIS — R252 Cramp and spasm: Secondary | ICD-10-CM

## 2014-01-26 DIAGNOSIS — I2510 Atherosclerotic heart disease of native coronary artery without angina pectoris: Secondary | ICD-10-CM | POA: Diagnosis present

## 2014-01-26 DIAGNOSIS — Z66 Do not resuscitate: Secondary | ICD-10-CM | POA: Diagnosis present

## 2014-01-26 DIAGNOSIS — J449 Chronic obstructive pulmonary disease, unspecified: Secondary | ICD-10-CM | POA: Diagnosis present

## 2014-01-26 DIAGNOSIS — G253 Myoclonus: Secondary | ICD-10-CM | POA: Diagnosis not present

## 2014-01-26 DIAGNOSIS — Z8614 Personal history of Methicillin resistant Staphylococcus aureus infection: Secondary | ICD-10-CM

## 2014-01-26 DIAGNOSIS — Z720 Tobacco use: Secondary | ICD-10-CM | POA: Diagnosis present

## 2014-01-26 DIAGNOSIS — Z8249 Family history of ischemic heart disease and other diseases of the circulatory system: Secondary | ICD-10-CM

## 2014-01-26 DIAGNOSIS — J441 Chronic obstructive pulmonary disease with (acute) exacerbation: Principal | ICD-10-CM | POA: Diagnosis present

## 2014-01-26 DIAGNOSIS — E785 Hyperlipidemia, unspecified: Secondary | ICD-10-CM | POA: Diagnosis present

## 2014-01-26 DIAGNOSIS — M79642 Pain in left hand: Secondary | ICD-10-CM

## 2014-01-26 DIAGNOSIS — Z6841 Body Mass Index (BMI) 40.0 and over, adult: Secondary | ICD-10-CM

## 2014-01-26 DIAGNOSIS — R451 Restlessness and agitation: Secondary | ICD-10-CM | POA: Diagnosis not present

## 2014-01-26 DIAGNOSIS — I5030 Unspecified diastolic (congestive) heart failure: Secondary | ICD-10-CM | POA: Diagnosis present

## 2014-01-26 DIAGNOSIS — J9622 Acute and chronic respiratory failure with hypercapnia: Secondary | ICD-10-CM | POA: Diagnosis present

## 2014-01-26 DIAGNOSIS — M79641 Pain in right hand: Secondary | ICD-10-CM

## 2014-01-26 DIAGNOSIS — R339 Retention of urine, unspecified: Secondary | ICD-10-CM | POA: Diagnosis not present

## 2014-01-26 DIAGNOSIS — J9621 Acute and chronic respiratory failure with hypoxia: Secondary | ICD-10-CM | POA: Diagnosis present

## 2014-01-26 DIAGNOSIS — Z9119 Patient's noncompliance with other medical treatment and regimen: Secondary | ICD-10-CM | POA: Diagnosis present

## 2014-01-26 LAB — PRO B NATRIURETIC PEPTIDE: Pro B Natriuretic peptide (BNP): 2153 pg/mL — ABNORMAL HIGH (ref 0–125)

## 2014-01-26 LAB — COMPREHENSIVE METABOLIC PANEL
ALT: 11 U/L (ref 0–35)
AST: 16 U/L (ref 0–37)
Albumin: 3.2 g/dL — ABNORMAL LOW (ref 3.5–5.2)
Alkaline Phosphatase: 103 U/L (ref 39–117)
Anion gap: 3 — ABNORMAL LOW (ref 5–15)
BUN: 9 mg/dL (ref 6–23)
CALCIUM: 9.3 mg/dL (ref 8.4–10.5)
CO2: 45 meq/L — AB (ref 19–32)
Chloride: 93 mEq/L — ABNORMAL LOW (ref 96–112)
Creatinine, Ser: 0.79 mg/dL (ref 0.50–1.10)
GFR calc Af Amer: >90 mL/min (ref >90)
GFR calc non Af Amer: 89 mL/min — ABNORMAL LOW (ref >90)
Glucose, Bld: 114 mg/dL — ABNORMAL HIGH (ref 70–99)
Potassium: 4.5 mEq/L (ref 3.7–5.3)
SODIUM: 141 meq/L (ref 137–147)
Total Bilirubin: 0.8 mg/dL (ref 0.3–1.2)
Total Protein: 6.7 g/dL (ref 6.0–8.3)

## 2014-01-26 LAB — CBC WITH DIFFERENTIAL/PLATELET
BASOS ABS: 0 10*3/uL (ref 0.0–0.1)
Basophils Relative: 0 % (ref 0–1)
Eosinophils Absolute: 0.2 10*3/uL (ref 0.0–0.7)
Eosinophils Relative: 2 % (ref 0–5)
HCT: 45 % (ref 36.0–46.0)
Hemoglobin: 13.3 g/dL (ref 12.0–15.0)
LYMPHS PCT: 17 % (ref 12–46)
Lymphs Abs: 1.2 10*3/uL (ref 0.7–4.0)
MCH: 29.8 pg (ref 26.0–34.0)
MCHC: 29.6 g/dL — ABNORMAL LOW (ref 30.0–36.0)
MCV: 100.9 fL — ABNORMAL HIGH (ref 78.0–100.0)
Monocytes Absolute: 0.7 10*3/uL (ref 0.1–1.0)
Monocytes Relative: 9 % (ref 3–12)
Neutro Abs: 4.9 10*3/uL (ref 1.7–7.7)
Neutrophils Relative %: 72 % (ref 43–77)
PLATELETS: 147 10*3/uL — AB (ref 150–400)
RBC: 4.46 MIL/uL (ref 3.87–5.11)
RDW: 14.9 % (ref 11.5–15.5)
WBC: 6.9 10*3/uL (ref 4.0–10.5)

## 2014-01-26 LAB — TROPONIN I: Troponin I: <0.3 ng/mL (ref <0.30)

## 2014-01-26 MED ORDER — HYDROMORPHONE HCL 1 MG/ML IJ SOLN
1.0000 mg | Freq: Once | INTRAMUSCULAR | Status: AC
  Administered 2014-01-26: 1 mg via INTRAVENOUS
  Filled 2014-01-26: qty 1

## 2014-01-26 MED ORDER — ALBUTEROL (5 MG/ML) CONTINUOUS INHALATION SOLN
10.0000 mg/h | INHALATION_SOLUTION | RESPIRATORY_TRACT | Status: DC
  Administered 2014-01-26: 10 mg/h via RESPIRATORY_TRACT
  Filled 2014-01-26: qty 20

## 2014-01-26 MED ORDER — NALOXONE HCL 0.4 MG/ML IJ SOLN
0.4000 mg | Freq: Once | INTRAMUSCULAR | Status: AC
  Administered 2014-01-26: 0.4 mg via INTRAVENOUS
  Filled 2014-01-26: qty 1

## 2014-01-26 NOTE — ED Notes (Signed)
Patient transported to X-ray

## 2014-01-26 NOTE — ED Notes (Signed)
Pt desaturating down to 88% when on 4 L [place], pt placed on non rebreather, sat up to 95%

## 2014-01-26 NOTE — ED Notes (Signed)
Non rebreather removed by Dr. Colin Rhein, pt placed on 4L. Sats 96%

## 2014-01-26 NOTE — ED Notes (Signed)
Pt. reports progressing SOB with productive cough onset yesterday , denies fever or chills. Pt. states she still smokes cigarettes , history of COPD and CHF .

## 2014-01-26 NOTE — ED Notes (Signed)
Respiratory tech at the bedside with [employer].

## 2014-01-26 NOTE — ED Notes (Signed)
CRITICAL VALUE ALERT  Critical value received: PCO2 100.0, CO2 45  Date of notification:  01/26/2014  Time of notification: 2300

## 2014-01-26 NOTE — ED Provider Notes (Signed)
CSN: [id number]     Arrival date & time 01/26/14  1952 History   First MD Initiated Contact with Patient 01/26/14 2046     Chief Complaint  Patient presents with  . Shortness of Breath     (Consider location/radiation/quality/duration/timing/severity/associated sxs/prior Treatment) Patient is a 58 y.o. female presenting with shortness of breath.  Shortness of Breath Severity:  Severe Onset quality:  Gradual Duration:  1 day Timing:  Constant Progression:  Worsening Chronicity:  New Context: not activity, not pollens and not URI   Relieved by:  Nothing Worsened by:  Nothing tried Ineffective treatments: only intermittently using inhaler and nebulizer. Associated symptoms: cough (nonproductive)   Associated symptoms: no abdominal pain, no chest pain, no hemoptysis, no syncope and no vomiting     Past Medical History  Diagnosis Date  . Coronary artery disease     S/P PCI of LAD with DES (12/2008). Total occlusion of RCA noted at that time., medically managed. ACS ruled out 03/2009 with Lexiscan myoview . Followed by [facility].  . Pulmonary hypertension     2-D Echo (95/1884) - Systolic pressure was moderately increased. PA peak pressure  62mHg. 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 Followed by Dr. WJoya Gaskins(Velora Heckler  . Diastolic dysfunction     2-D Echo (12/2008) - Normal LV Systolic funciton with EF 60-65%. Grade 1 diastolid dysfunction. No regional wall motion abnormalities. Moderate pulmonary HTN with PA peak pressure 549mg.  . Marland KitchenOPD (chronic obstructive pulmonary disease)     Severe. Gold Stage IV.  PFTs (12/2008) - severe obstructive airway disease. Active tobacco use. Requires 4L O2 at home.  . Pulmonary nodule, right     Small right middle lobe nodule. Stable as of 12/2008.  . Marland Kitchenleep apnea     Presumptive. No documented sleep studies.   . Prediabetes 12/2008    HgbA1c  6.4 (12/2008)  . Hx MRSA infection     Recurrent MRSA thigh abscesses.  . Tobacco abuse     Ongoing.  . Obesity   . Hyperlipidemia   . GERD (gastroesophageal reflux disease)     S/P Nissen fundoplication.  . Shortness of breath   . CHF (congestive heart failure)    Past Surgical History  Procedure Laterality Date  . Total abdominal hysterectomy w/ bilateral salpingoophorectomy    . Nissen fundoplication     Family History  Problem Relation Age of Onset  . Heart disease Mother 4772  Deceased from MI at 59yo. Hypertension Mother   . Heart disease Father 5435  Deceased of MI age 59yo. Hypertension Father   . Hypertension Brother   . Lung cancer      Grandmother   History  Substance Use Topics  . Smoking status: Current Every Day Smoker -- 0.50 packs/day for 40 years    Types: Cigarettes  . Smokeless tobacco: Never Used     Comment: 2 per day  . Alcohol Use: No   OB History   Grav Para Term Preterm Abortions TAB SAB Ect Mult Living                 Review of Systems  Respiratory: Positive for cough (nonproductive) and shortness of breath. Negative for hemoptysis.   Cardiovascular: Negative for chest pain and syncope.  Gastrointestinal: Negative for vomiting and abdominal pain.  All other systems reviewed and are negative.  Allergies  Fluconazole  Home Medications   Prior to Admission medications   Medication Sig Start Date End Date Taking? Authorizing Provider  albuterol (PROVENTIL HFA;VENTOLIN HFA) 108 (90 BASE) MCG/ACT inhaler Inhale 2 puffs into the lungs 3 (three) times daily as needed for wheezing or shortness of breath.   Yes Historical Provider, MD  albuterol (PROVENTIL) (2.5 MG/3ML) 0.083% nebulizer solution Take 2.5 mg by nebulization daily as needed for wheezing or shortness of breath.   Yes Historical Provider, MD  Aspirin-Salicylamide-Caffeine (BC HEADACHE POWDER PO) Take 1 packet by mouth 5 (five) times daily as needed (pain/headache).   Yes  Historical Provider, MD  budesonide-formoterol (SYMBICORT) 160-4.5 MCG/ACT inhaler Inhale 1 puff into the lungs daily.   Yes Historical Provider, MD  Fluticasone-Salmeterol (ADVAIR) 500-50 MCG/DOSE AEPB Inhale 2 puffs into the lungs 2 (two) times daily.    Yes Historical Provider, MD  furosemide (LASIX) 20 MG tablet Take 60 mg by mouth daily.   Yes Historical Provider, MD  lovastatin (MEVACOR) 20 MG tablet Take 1 tablet (20 mg total) by mouth at bedtime. 06/12/13  Yes Jessee Avers, MD  tiotropium (SPIRIVA HANDIHALER) 18 MCG inhalation capsule Place 1 capsule (18 mcg total) into inhaler and inhale daily. 06/12/13  Yes Jessee Avers, MD   BP 136/83  Pulse 77  Temp(Src) 98.5 F (36.9 C) (Oral)  Resp 19  Ht _0  (1.651 m)  Wt 256 lb 13.4 oz (116.5 kg)  BMI 42.74 kg/m2  SpO2 93% Physical Exam  Vitals reviewed. Constitutional: She is oriented to person, place, and time. She appears well-developed and well-nourished.  HENT:  Head: Normocephalic and atraumatic.  Right Ear: External ear normal.  Left Ear: External ear normal.  Eyes: Conjunctivae and EOM are normal. Pupils are equal, round, and reactive to light.  Neck: Normal range of motion. Neck supple.  Cardiovascular: Normal rate, regular rhythm, normal heart sounds and intact distal pulses.   Pulmonary/Chest: Accessory muscle usage present. Tachypnea noted. She has decreased breath sounds (bilaterally). She has rales (bil).  Abdominal: Soft. Bowel sounds are normal. There is no tenderness.  Musculoskeletal: Normal range of motion.  Neurological: She is alert and oriented to person, place, and time.  Skin: Skin is warm and dry.    ED Course  Procedures (including critical care time) Labs Review Labs Reviewed  CBC WITH DIFFERENTIAL - Abnormal; Notable for the following:    MCV 100.9 (*)    MCHC 29.6 (*)    Platelets 147 (*)    All other components within normal limits  COMPREHENSIVE METABOLIC PANEL - Abnormal; Notable for the  following:    Chloride 93 (*)    CO2 45 (*)    Glucose, Bld 114 (*)    Albumin 3.2 (*)    GFR calc non Af Amer 89 (*)    Anion gap 3 (*)    All other components within normal limits  PRO B NATRIURETIC PEPTIDE - Abnormal; Notable for the following:    Pro B Natriuretic peptide (BNP) 2153.0 (*)    All other components within normal limits  PHOSPHORUS - Abnormal; Notable for the following:    Phosphorus 4.8 (*)    All other components within normal limits  BASIC METABOLIC PANEL - Abnormal; Notable for the following:    Chloride 92 (*)    CO2 45 (*)    Glucose, Bld 175 (*)    GFR calc non Af Amer 79 (*)    All other components within normal limits  CBC - Abnormal; Notable for the following:    HCT 46.4 (*)    MCV 102.0 (*)    MCHC 29.3 (*)    Platelets 123 (*)    All other components within normal limits  BLOOD GAS, ARTERIAL - Abnormal; Notable for the following:    pH, Arterial 7.210 (*)    pO2, Arterial 74.7 (*)    Bicarbonate 48.0 (*)    Acid-Base Excess 19.6 (*)    All other components within normal limits  BLOOD GAS, ARTERIAL - Abnormal; Notable for the following:    pH, Arterial 7.311 (*)    pCO2 arterial 98.7 (*)    pO2, Arterial 70.9 (*)    Bicarbonate 48.2 (*)    Acid-Base Excess 21.0 (*)    All other components within normal limits  BLOOD GAS, ARTERIAL - Abnormal; Notable for the following:    pH, Arterial 7.331 (*)    pCO2 arterial 95.8 (*)    pO2, Arterial 71.7 (*)    Bicarbonate 49.0 (*)    Acid-Base Excess 21.9 (*)    All other components within normal limits  TROPONIN I  MAGNESIUM  TSH  TROPONIN I  TROPONIN I  TROPONIN I  BLOOD GAS, ARTERIAL    Imaging Review Dg Chest 2 View  01/26/2014   CLINICAL DATA:  Initial evaluation for shortness of breath, cough. History of COPD. Smoker.  EXAM: CHEST  2 VIEW  COMPARISON:  Prior radiograph from 12/22/2013  FINDINGS: Study is limited by technique patient is rotated to the right. Cardiomegaly is grossly stable  from prior exam. Mediastinal silhouette within normal limits.  Changes suggesting COPD again seen. No focal infiltrate to suggest acute infectious pneumonitis. There is mild diffuse pulmonary vascular congestion without frank pulmonary edema. No pleural effusion. No pneumothorax.  No acute osseus abnormality. Surgical clips overlie the GE junction.  IMPRESSION: 1. Cardiomegaly with mild diffuse pulmonary vascular congestion without overt pulmonary edema. 2. COPD.   Electronically Signed   By: Jeannine Boga M.D.   On: 01/26/2014 21:39     EKG Interpretation   Date/Time:  Sunday January 26 2014 20:57:22 EDT Ventricular Rate:  85 PR Interval:  145 QRS Duration: 92 QT Interval:  352 QTC Calculation: 418 R Axis:   107 Text Interpretation:  Sinus rhythm Probable left atrial enlargement Right  axis deviation Abnormal T, consider ischemia, diffuse leads No significant  change since last tracing Confirmed by Debby Freiberg [phone]) on  01/26/2014 9:35:26 PM     CRITICAL CARE Performed by: Debby Freiberg   Total critical care time: 90 minutes  Critical care time was exclusive of separately billable procedures and treating other patients.  Critical care was necessary to treat or prevent imminent or life-threatening deterioration from acute hypercarbic respiratory failure  Critical care was time spent personally by me on the following activities: development of treatment plan with patient and/or surrogate as well as nursing, discussions with consultants, evaluation of patient's response to treatment, examination of patient, obtaining history from patient or surrogate, ordering and performing treatments and interventions, ordering and review of laboratory studies, ordering and review of radiographic studies, pulse oximetry and re-evaluation of patient's condition.  MDM   Final diagnoses:  SOB (shortness of breath)  Acute on chronic respiratory failure with hypercapnia  Bilateral hand  pain  Chronic diastolic CHF (congestive heart failure)  COPD, severe  Tobacco use disorder    58 y.o. female with pertinent PMH of COPD on 4L home O2, prior hypercarbic respiratory  failure, CAD, pulmonary HTN, CHF presents with recurrent acute on chronic dyspnea x 1 day.  No fevers, producitve cough, or other infectious symptoms.  Pt denies chest pain.  No known precipitating factors with exception of that pt continues to smoke.  On arrival vitals as above with hypoxia on home O2, intermittently requiring NRB at 15 L to maintain sats.  She was placed on bipap after VBG demonstrated hypercarbic respiratory failure.  Pt was given pain medication for chronic pain, shortly therafter became somnolent and unresponsive except to vigorous stimulation.  This resolved with narcan.  Pt then verified DNR/I status.  Consulted internal medicine for stepdown admission.  1. SOB (shortness of breath)   2. Acute on chronic respiratory failure with hypercapnia   3. Bilateral hand pain   4. Chronic diastolic CHF (congestive heart failure)   5. COPD, severe   6. Tobacco use disorder         Debby Freiberg, MD 01/27/14 [phone]

## 2014-01-26 NOTE — ED Notes (Addendum)
Pt very lethargic while nurse tech rounding, pt O2 saturation low after breathing treatment, pt very pale, and diaphoretic. Dr. Colin Rhein notified order to place pt on Bipap gotten, Respiratory tech notified.

## 2014-01-27 DIAGNOSIS — Z801 Family history of malignant neoplasm of trachea, bronchus and lung: Secondary | ICD-10-CM | POA: Diagnosis not present

## 2014-01-27 DIAGNOSIS — F17210 Nicotine dependence, cigarettes, uncomplicated: Secondary | ICD-10-CM | POA: Diagnosis present

## 2014-01-27 DIAGNOSIS — Z9119 Patient's noncompliance with other medical treatment and regimen: Secondary | ICD-10-CM | POA: Diagnosis present

## 2014-01-27 DIAGNOSIS — I2510 Atherosclerotic heart disease of native coronary artery without angina pectoris: Secondary | ICD-10-CM | POA: Diagnosis present

## 2014-01-27 DIAGNOSIS — I5032 Chronic diastolic (congestive) heart failure: Secondary | ICD-10-CM | POA: Diagnosis present

## 2014-01-27 DIAGNOSIS — R0602 Shortness of breath: Secondary | ICD-10-CM | POA: Diagnosis present

## 2014-01-27 DIAGNOSIS — Z9861 Coronary angioplasty status: Secondary | ICD-10-CM | POA: Diagnosis not present

## 2014-01-27 DIAGNOSIS — Z8614 Personal history of Methicillin resistant Staphylococcus aureus infection: Secondary | ICD-10-CM | POA: Diagnosis not present

## 2014-01-27 DIAGNOSIS — Z8249 Family history of ischemic heart disease and other diseases of the circulatory system: Secondary | ICD-10-CM | POA: Diagnosis not present

## 2014-01-27 DIAGNOSIS — Z720 Tobacco use: Secondary | ICD-10-CM

## 2014-01-27 DIAGNOSIS — Z6841 Body Mass Index (BMI) 40.0 and over, adult: Secondary | ICD-10-CM | POA: Diagnosis not present

## 2014-01-27 DIAGNOSIS — I272 Other secondary pulmonary hypertension: Secondary | ICD-10-CM | POA: Diagnosis present

## 2014-01-27 DIAGNOSIS — E785 Hyperlipidemia, unspecified: Secondary | ICD-10-CM

## 2014-01-27 DIAGNOSIS — R451 Restlessness and agitation: Secondary | ICD-10-CM | POA: Diagnosis not present

## 2014-01-27 DIAGNOSIS — Z66 Do not resuscitate: Secondary | ICD-10-CM | POA: Diagnosis present

## 2014-01-27 DIAGNOSIS — K219 Gastro-esophageal reflux disease without esophagitis: Secondary | ICD-10-CM | POA: Diagnosis present

## 2014-01-27 DIAGNOSIS — J9622 Acute and chronic respiratory failure with hypercapnia: Secondary | ICD-10-CM

## 2014-01-27 DIAGNOSIS — G253 Myoclonus: Secondary | ICD-10-CM | POA: Diagnosis not present

## 2014-01-27 DIAGNOSIS — J449 Chronic obstructive pulmonary disease, unspecified: Secondary | ICD-10-CM

## 2014-01-27 DIAGNOSIS — J9621 Acute and chronic respiratory failure with hypoxia: Secondary | ICD-10-CM | POA: Diagnosis present

## 2014-01-27 DIAGNOSIS — E874 Mixed disorder of acid-base balance: Secondary | ICD-10-CM | POA: Diagnosis present

## 2014-01-27 DIAGNOSIS — J441 Chronic obstructive pulmonary disease with (acute) exacerbation: Secondary | ICD-10-CM | POA: Diagnosis present

## 2014-01-27 DIAGNOSIS — R339 Retention of urine, unspecified: Secondary | ICD-10-CM | POA: Diagnosis not present

## 2014-01-27 DIAGNOSIS — Z9981 Dependence on supplemental oxygen: Secondary | ICD-10-CM | POA: Diagnosis not present

## 2014-01-27 DIAGNOSIS — E669 Obesity, unspecified: Secondary | ICD-10-CM | POA: Diagnosis present

## 2014-01-27 LAB — BLOOD GAS, ARTERIAL
Acid-Base Excess: 19.6 mmol/L — ABNORMAL HIGH (ref 0.0–2.0)
Acid-Base Excess: 21 mmol/L — ABNORMAL HIGH (ref 0.0–2.0)
Acid-Base Excess: 21.9 mmol/L — ABNORMAL HIGH (ref 0.0–2.0)
Acid-Base Excess: 21.6 mmol/L — ABNORMAL HIGH (ref 0.0–2.0)
BICARBONATE: 48 meq/L — AB (ref 20.0–24.0)
Bicarbonate: 48 mEq/L — ABNORMAL HIGH (ref 20.0–24.0)
Bicarbonate: 48.2 mEq/L — ABNORMAL HIGH (ref 20.0–24.0)
Bicarbonate: 49 mEq/L — ABNORMAL HIGH (ref 20.0–24.0)
DELIVERY SYSTEMS: POSITIVE
DELIVERY SYSTEMS: POSITIVE
Delivery systems: POSITIVE
Delivery systems: POSITIVE
Drawn by: 283401
Drawn by: 27733
Drawn by: 27733
Drawn by: 277331
EXPIRATORY PAP: 8
EXPIRATORY PAP: 8
EXPIRATORY PAP: 8
Expiratory PAP: 8
FIO2: 0.7 %
FIO2: 0.55 %
FIO2: 0.55 %
FIO2: 0.55 %
INSPIRATORY PAP: 20
Inspiratory PAP: 20
Inspiratory PAP: 20
Inspiratory PAP: 20
LHR: 12 {breaths}/min
O2 SAT: 95.7 %
O2 Saturation: 93 %
O2 Saturation: 93.8 %
O2 Saturation: 93.9 %
PATIENT TEMPERATURE: 98.6
PATIENT TEMPERATURE: 99.1
PCO2 ART: 95.8 mmHg — AB (ref 35.0–45.0)
PO2 ART: 71.7 mmHg — AB (ref 80.0–100.0)
Patient temperature: 99.1
Patient temperature: 99
RATE: 12 resp/min
RATE: 12 resp/min
RATE: 12 resp/min
TCO2: 51.9 mmol/L (ref 0–100)
TCO2: 51.2 mmol/L (ref 0–100)
TCO2: 51.9 mmol/L (ref 0–100)
TCO2: 50.5 mmol/L (ref 0–100)
pCO2 arterial: 98.7 mmHg (ref 35.0–45.0)
pCO2 arterial: 81.6 mmHg (ref 35.0–45.0)
pH, Arterial: 7.21 — ABNORMAL LOW (ref 7.350–7.450)
pH, Arterial: 7.311 — ABNORMAL LOW (ref 7.350–7.450)
pH, Arterial: 7.331 — ABNORMAL LOW (ref 7.350–7.450)
pH, Arterial: 7.389 (ref 7.350–7.450)
pO2, Arterial: 74.7 mmHg — ABNORMAL LOW (ref 80.0–100.0)
pO2, Arterial: 70.9 mmHg — ABNORMAL LOW (ref 80.0–100.0)
pO2, Arterial: 72.6 mmHg — ABNORMAL LOW (ref 80.0–100.0)

## 2014-01-27 LAB — CBC
HCT: 46.4 % — ABNORMAL HIGH (ref 36.0–46.0)
HEMOGLOBIN: 13.6 g/dL (ref 12.0–15.0)
MCH: 29.9 pg (ref 26.0–34.0)
MCHC: 29.3 g/dL — AB (ref 30.0–36.0)
MCV: 102 fL — ABNORMAL HIGH (ref 78.0–100.0)
Platelets: 123 10*3/uL — ABNORMAL LOW (ref 150–400)
RBC: 4.55 MIL/uL (ref 3.87–5.11)
RDW: 14.8 % (ref 11.5–15.5)
WBC: 6.6 10*3/uL (ref 4.0–10.5)

## 2014-01-27 LAB — BASIC METABOLIC PANEL
ANION GAP: 9 (ref 5–15)
BUN: 9 mg/dL (ref 6–23)
CO2: 45 mEq/L (ref 19–32)
CREATININE: 0.8 mg/dL (ref 0.50–1.10)
Calcium: 9.4 mg/dL (ref 8.4–10.5)
Chloride: 92 mEq/L — ABNORMAL LOW (ref 96–112)
GFR calc Af Amer: >90 mL/min (ref >90)
GFR, EST NON AFRICAN AMERICAN: 79 mL/min — AB (ref >90)
Glucose, Bld: 175 mg/dL — ABNORMAL HIGH (ref 70–99)
POTASSIUM: 4.2 meq/L (ref 3.7–5.3)
Sodium: 146 mEq/L (ref 137–147)

## 2014-01-27 LAB — MAGNESIUM: Magnesium: 1.9 mg/dL (ref 1.5–2.5)

## 2014-01-27 LAB — TROPONIN I: Troponin I: <0.3 ng/mL (ref <0.30)

## 2014-01-27 LAB — PHOSPHORUS: Phosphorus: 4.8 mg/dL — ABNORMAL HIGH (ref 2.3–4.6)

## 2014-01-27 LAB — TSH: TSH: 2.06 u[IU]/mL (ref 0.350–4.500)

## 2014-01-27 MED ORDER — LEVOFLOXACIN IN D5W 750 MG/150ML IV SOLN
750.0000 mg | INTRAVENOUS | Status: DC
  Administered 2014-01-27 – 2014-02-01 (×6): 750 mg via INTRAVENOUS
  Filled 2014-01-27 (×6): qty 150

## 2014-01-27 MED ORDER — IBUPROFEN 600 MG PO TABS
600.0000 mg | ORAL_TABLET | Freq: Once | ORAL | Status: AC
  Administered 2014-01-27: 600 mg via ORAL
  Filled 2014-01-27: qty 1

## 2014-01-27 MED ORDER — METHYLPREDNISOLONE SODIUM SUCC 125 MG IJ SOLR
125.0000 mg | INTRAMUSCULAR | Status: AC
  Administered 2014-01-27: 125 mg via INTRAVENOUS
  Filled 2014-01-27: qty 2

## 2014-01-27 MED ORDER — HEPARIN SODIUM (PORCINE) 5000 UNIT/ML IJ SOLN
5000.0000 [IU] | Freq: Three times a day (TID) | INTRAMUSCULAR | Status: DC
  Administered 2014-01-27 – 2014-02-02 (×19): 5000 [IU] via SUBCUTANEOUS
  Filled 2014-01-27 (×26): qty 1

## 2014-01-27 MED ORDER — NALOXONE HCL 0.4 MG/ML IJ SOLN: 0.4000 mg | Freq: Once | INTRAMUSCULAR | Status: DC

## 2014-01-27 MED ORDER — SODIUM CHLORIDE 0.9 % IJ SOLN
3.0000 mL | Freq: Two times a day (BID) | INTRAMUSCULAR | Status: DC
  Administered 2014-01-27 – 2014-02-01 (×6): 3 mL via INTRAVENOUS

## 2014-01-27 MED ORDER — BUDESONIDE-FORMOTEROL FUMARATE 160-4.5 MCG/ACT IN AERO
1.0000 | INHALATION_SPRAY | Freq: Every day | RESPIRATORY_TRACT | Status: DC
  Filled 2014-01-27: qty 6

## 2014-01-27 MED ORDER — SODIUM CHLORIDE 0.9 % IV SOLN
250.0000 mL | INTRAVENOUS | Status: DC | PRN
Start: 2014-01-27 — End: 2014-02-02

## 2014-01-27 MED ORDER — MOMETASONE FURO-FORMOTEROL FUM 200-5 MCG/ACT IN AERO
2.0000 | INHALATION_SPRAY | Freq: Two times a day (BID) | RESPIRATORY_TRACT | Status: DC
  Administered 2014-01-27 – 2014-02-02 (×10): 2 via RESPIRATORY_TRACT
  Filled 2014-01-27 (×2): qty 8.8

## 2014-01-27 MED ORDER — METHYLPREDNISOLONE SODIUM SUCC 125 MG IJ SOLR
80.0000 mg | Freq: Two times a day (BID) | INTRAMUSCULAR | Status: DC
  Administered 2014-01-27 – 2014-01-29 (×5): 80 mg via INTRAVENOUS
  Filled 2014-01-27 (×4): qty 1.28
  Filled 2014-01-27 (×2): qty 2
  Filled 2014-01-27 (×2): qty 1.28

## 2014-01-27 MED ORDER — SODIUM CHLORIDE 0.9 % IJ SOLN: 3.0000 mL | INTRAMUSCULAR | Status: DC | PRN

## 2014-01-27 MED ORDER — PNEUMOCOCCAL VAC POLYVALENT 25 MCG/0.5ML IJ INJ
0.5000 mL | INJECTION | INTRAMUSCULAR | Status: DC
  Filled 2014-01-27: qty 0.5

## 2014-01-27 MED ORDER — FUROSEMIDE 10 MG/ML IJ SOLN
40.0000 mg | INTRAMUSCULAR | Status: AC
  Administered 2014-01-27: 40 mg via INTRAVENOUS
  Filled 2014-01-27: qty 4

## 2014-01-27 MED ORDER — IPRATROPIUM-ALBUTEROL 0.5-2.5 (3) MG/3ML IN SOLN
3.0000 mL | RESPIRATORY_TRACT | Status: DC
  Administered 2014-01-27 – 2014-01-29 (×10): 3 mL via RESPIRATORY_TRACT
  Filled 2014-01-27 (×9): qty 3

## 2014-01-27 MED ORDER — SODIUM CHLORIDE 0.9 % IJ SOLN
3.0000 mL | Freq: Two times a day (BID) | INTRAMUSCULAR | Status: DC
  Administered 2014-01-27 – 2014-02-01 (×9): 3 mL via INTRAVENOUS

## 2014-01-27 MED ORDER — ONDANSETRON HCL 4 MG PO TABS
4.0000 mg | ORAL_TABLET | Freq: Four times a day (QID) | ORAL | Status: DC | PRN
Start: 2014-01-27 — End: 2014-02-02

## 2014-01-27 MED ORDER — ONDANSETRON HCL 4 MG/2ML IJ SOLN: 4.0000 mg | Freq: Four times a day (QID) | INTRAMUSCULAR | Status: DC | PRN

## 2014-01-27 MED ORDER — POLYVINYL ALCOHOL 1.4 % OP SOLN
1.0000 [drp] | OPHTHALMIC | Status: DC | PRN
  Administered 2014-01-27: 1 [drp] via OPHTHALMIC
  Filled 2014-01-27: qty 15

## 2014-01-27 NOTE — Progress Notes (Signed)
Case discussed with Dr. Kazibwe soon after the resident saw the patient.  We reviewed the resident's history and exam and pertinent patient test results.  I agree with the assessment, diagnosis, and plan of care documented in the resident's note. 

## 2014-01-27 NOTE — Progress Notes (Signed)
Attempted to call pt's daughter left message to call back.

## 2014-01-27 NOTE — Progress Notes (Signed)
Discussed with Dr. Posey Pronto if there was a need to do a bladder scan since pt has voided. He said it was ok to not do it. Will continue to monitor.

## 2014-01-27 NOTE — Progress Notes (Signed)
Dr. Posey Pronto returned page at 1439 and will come see family shortly.

## 2014-01-27 NOTE — Progress Notes (Signed)
CRITICAL VALUE ALERT  Critical value received:  PCO2 81.6  Date of notification:  01/27/14  Time of notification:  7616  Critical value read back:Yes.    Nurse who received alert:  Noreene Larsson  MD notified (1st page): Teaching Service  Time of first page:  1813  MD notified (2nd page):  Time of second page:  Responding MD:  Dr. Posey Pronto  Time MD responded:  [phone]

## 2014-01-27 NOTE — Progress Notes (Signed)
CRITICAL VALUE ALERT  Critical value received:  PCO2 95.8  Date of notification:  01/27/14  Time of notification:  1250  Critical value read back:Yes.    Nurse who received alert:  Noreene Larsson   MD notified (1st page):  Dr. Posey Pronto  Time of first page:  1300  MD notified (2nd page):  Time of second page:  Responding MD: Dr. Posey Pronto  Time MD responded:  [phone]

## 2014-01-27 NOTE — ED Notes (Addendum)
Pt still very lethargic, just responding to pain, VSS, pt continues on Bipap, Lasix 40 mg IV given as ordered by MD, pt still no make any urine, report given to stepdown nurse, awaiting for respiratory tech for transport to the floor.

## 2014-01-27 NOTE — Progress Notes (Signed)
Subjective: She was on BiPAP at the time of interview and difficult to understand. She admits to smoking though notes she is now at 2 ppd from 4 ppd a few weeks ago. She is frustrated that everyone seems to be ignoring her hand twitches and the doctor she saw in clinic a few days ago thought she was doing okay. She expressed her dissatisfaction with having to be hospitalized repeatedly and was anxious about whether or not she would make it out of the hospital alive.   I reassured that while she was in a precarious place with regards to her health, she could still make a turnaround but that we would be dishonest if we didn't tell her how poor her blood gases appeared. She confirmed that she did not want to be intubated with me again and also did not want to see her daughter later.   Objective: Vital signs in last 24 hours: Filed Vitals:   01/27/14 0446 01/27/14 0737 01/27/14 0826 01/27/14 0828  BP:  130/80 130/80   Pulse: 80 89 79   Temp:  99.1 F (37.3 C)    TempSrc:  Axillary    Resp: _0 Height:      Weight:      SpO2: 88% 94% 99% 95%   Weight change:   Intake/Output Summary (Last 24 hours) at 01/27/14 0841 Last data filed at 01/27/14 0500  Gross per 24 hour  Intake      0 ml  Output    700 ml  Net   -700 ml   General: resting in bed, wearing BiPAP mask HEENT: PERRL, EOMI, no scleral icterus Cardiac: RRR, no rubs, murmurs or gallops Pulm: difficult to appreciate breath sounds amidst the machine sounds Abd: soft, nontender, nondistended, BS present Ext: warm and well perfused, no pedal edema Neuro: alert and oriented X3, cranial nerves II-XII grossly intact, strength and sensation to light touch equal in bilateral upper and lower extremities  Lab Results: Basic Metabolic Panel:  Recent Labs Lab 01/22/14 1444 01/26/14 2138 01/27/14 0407  NA 143 141 146  K 4.4 4.5 4.2  CL 94* 93* 92*  CO2 >45* 45* 45*  GLUCOSE 85 114* 175*  BUN _1 CREATININE 0.78 0.79  0.80  CALCIUM 8.7 9.3 9.4  MG 1.9  --  1.9  PHOS  --   --  4.8*   CBC:  Recent Labs Lab 01/26/14 2138 01/27/14 0407  WBC 6.9 6.6  NEUTROABS 4.9  --   HGB 13.3 13.6  HCT 45.0 46.4*  MCV 100.9* 102.0*  PLT 147* 123*   Cardiac Enzymes:  Recent Labs Lab 01/26/14 2150 01/27/14 0407  TROPONINI <0.30 <0.30   Thyroid Function Tests:  Recent Labs Lab 01/27/14 0407  TSH 2.060   Medications: I have reviewed the patient's current medications. Scheduled Meds: . budesonide-formoterol  1 puff Inhalation Daily  . heparin  5,000 Units Subcutaneous 3 times per day  . ipratropium-albuterol  3 mL Nebulization Q4H  . levofloxacin (LEVAQUIN) IV  750 mg Intravenous Q24H  . mometasone-formoterol  2 puff Inhalation BID  . [START ON 01/28/2014] pneumococcal 23 valent vaccine  0.5 mL Intramuscular Tomorrow-1000  . sodium chloride  3 mL Intravenous Q12H  . sodium chloride  3 mL Intravenous Q12H   Continuous Infusions:  PRN Meds:.sodium chloride, ondansetron (ZOFRAN) IV, ondansetron, sodium chloride Assessment/Plan: Principal Problem:   Acute on chronic respiratory failure with hypercapnia Active Problems:   TOBACCO ABUSE  COPD, severe   Hyperlipidemia   Chronic diastolic CHF (congestive heart failure)  [name] is 58 year old with COPD, CHF, pulmonary HTN, CAD, HLD, tobacco abuse who presents today with acute-on-chronic hypoxic hypercarbic respiratory failure who is hospitalized for acute-on-chronic hypercarbic hypercapnic respiratory failure.  Acute-on-chronic hypercarbic hypercapnic respiratory failure: Unclear from her as to what may have triggered this episode though she does have a long history of not taking her medication. Last ABG is consistent with respiratory acidosis and metabolic alkalosis. Less likely cardiac given CXR findings, stable EKG findings though she did have significant LE edema at the time of admission. Plan to treat as COPD exacerbation. -Continue Solumedrol  54m IV BID -Continue Levaquin 7517m(Day 1/5) -Hold home meds until she can be weaned off BiPAP mask  CHF: Weight on admission was 260lbs, up from 247lbs at the time of discharge from last hospitalization. -Hold Lasix for now -Continue daily weights, strict I&Os  HLD: Hold home meds until she can tolerate PO intake.  #FEN:  -Diet: NPO  #DVT prophylaxis: heparin 5000 units subcutaneous  #CODE STATUS: DNR/DNI  Dispo: Disposition is deferred at this time, awaiting improvement of current medical problems.  Anticipated discharge in approximately 1-2 day(s).   The patient does have a current PCP (RJessee AversMD) and [facility].  The patient does have transportation limitations that hinder transportation to clinic appointments.  .Services Needed at time of discharge: Y = Yes, Blank = No PT:   OT:   RN:   Equipment:   Other:     LOS: 1 day   RuCharlott RakesMD 01/27/2014, 8:41 AM

## 2014-01-27 NOTE — Progress Notes (Signed)
CRITICAL VALUE ALERT  Critical value received:  Pco2  Unreadable greater than 130  Date of notification:  26 oct 15  Time of notification: 0405  Critical value read back:Yes.    Nurse who received alert:  Corie Chiquito   MD notified (1st page):  Dr Eulas Post  Time of first page:  0410  MD notified (2nd page):  Time of second page:  Responding MD: DR.Glenn  Time MD responded: [phone]

## 2014-01-27 NOTE — ED Notes (Signed)
Report attempted, RN no available at this time to get report, he will call back for report.

## 2014-01-27 NOTE — Progress Notes (Signed)
Paged Dr. Posey Pronto to let him know that pts family is at bedside. Awaiting call back.

## 2014-01-27 NOTE — Progress Notes (Signed)
**Note De-Identified  Obfuscation** Patient removed from BIPAP to 55% Venti mask,  SAT 90%, BBS dim/clr. RT to cont. To monitor

## 2014-01-27 NOTE — H&P (Signed)
Date: 01/27/2014               Patient Name:  [name] MRN: [id number]  DOB: 1955/11/17 Age / Sex: 58 y.o., female   PCP: Jessee Avers, MD         Medical Service: Internal Medicine Teaching Service         Attending Physician: Dr. Sid Falcon, MD    First Contact: Dr. Posey Pronto Pager: [phone]  Second Contact: Dr. Heber Franquez Pager: [phone]       After Hours (After 5p/  First Contact Pager: [phone]  weekends / holidays): Second Contact Pager: [phone]   Chief Complaint: shortness of breath  History of Present Illness: 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 It has been worsening over the past week requiring her to use her albuterol. She reports compliance of her medications at home. She has cough - unclear if productive. Denies fevers, chills, nausea/vomiting, abdominal pain, trouble urinating.  Rest of HPI per ED as pt is somnolent and undergoing BiPAP tx. No exacerbating or relieving factors. Only intermittently using inhaler and nebulizer. No hemoptysis, chest pain, or syncope.  She was last seen in clinic on 01/22/2014. Her weight was up 7 lbs to 261 from her last visit on 12/22/2013. Her lasix was increased to 74m BID. Her chronic respiratory failure and COPD were noted to be stable.   She was hospitalized from 8/24-8/26 for acute on chronic respiratory failure thought to be due to COPD exacerbation. Her ABG on admission was pH 7.29, CO2 95, O2 60, bicarb 46. She was treated with BiPAP and transitioned to [place]. She received prednisone 670mand levaquin 72547maily x 5 days.  Meds: Current Facility-Administered Medications  Medication Dose Route Frequency Provider Last Rate Last Dose  . albuterol (PROVENTIL,VENTOLIN) solution continuous neb  10 mg/hr Nebulization Continuous MatDebby FreibergD 2 mL/hr at 01/26/14  2221 10 mg/hr at 01/26/14 2221   Current Outpatient Prescriptions  Medication Sig Dispense Refill  . albuterol (PROVENTIL HFA;VENTOLIN HFA) 108 (90 BASE) MCG/ACT inhaler Inhale 2 puffs into the lungs 3 (three) times daily as needed for wheezing or shortness of breath.      . aMarland Kitchenbuterol (PROVENTIL) (2.5 MG/3ML) 0.083% nebulizer solution Take 2.5 mg by nebulization daily as needed for wheezing or shortness of breath.      . Aspirin-Salicylamide-Caffeine (BC HEADACHE POWDER PO) Take 1 packet by mouth 5 (five) times daily as needed (pain/headache).      . budesonide-formoterol (SYMBICORT) 160-4.5 MCG/ACT inhaler Inhale 1 puff into the lungs daily.      . Fluticasone-Salmeterol (ADVAIR) 500-50 MCG/DOSE AEPB Inhale 2 puffs into the lungs 2 (two) times daily.       . furosemide (LASIX) 20 MG tablet Take 60 mg by mouth daily.      . lMarland Kitchenvastatin (MEVACOR) 20 MG tablet Take 1 tablet (20 mg total) by mouth at bedtime.  30 tablet  11  . tiotropium (SPIRIVA HANDIHALER) 18 MCG inhalation capsule Place 1 capsule (18 mcg total) into inhaler and inhale daily.  30 capsule  12    Allergies: Allergies as of 01/26/2014 - Review Complete 01/26/2014  Allergen Reaction Noted  . Fluconazole Anaphylaxis    Past Medical History  Diagnosis Date  . Coronary artery disease     S/P PCI of LAD with DES (  12/2008). Total occlusion of RCA noted at that time., medically managed. ACS ruled out 03/2009 with Lexiscan myoview . Followed by [facility].  . Pulmonary hypertension     2-D Echo (23/7628) - Systolic pressure was moderately increased. PA peak pressure  10mHg. 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 Followed by Dr. WJoya Gaskins(Velora Heckler  . Diastolic dysfunction     2-D Echo (12/2008) - Normal LV Systolic funciton with EF 60-65%. Grade 1 diastolid dysfunction. No regional wall motion abnormalities. Moderate pulmonary HTN with  PA peak pressure 531mg.  . Marland KitchenOPD (chronic obstructive pulmonary disease)     Severe. Gold Stage IV.  PFTs (12/2008) - severe obstructive airway disease. Active tobacco use. Requires 4L O2 at home.  . Pulmonary nodule, right     Small right middle lobe nodule. Stable as of 12/2008.  . Marland Kitchenleep apnea     Presumptive. No documented sleep studies.   . Prediabetes 12/2008    HgbA1c 6.4 (12/2008)  . Hx MRSA infection     Recurrent MRSA thigh abscesses.  . Tobacco abuse     Ongoing.  . Obesity   . Hyperlipidemia   . GERD (gastroesophageal reflux disease)     S/P Nissen fundoplication.  . Shortness of breath   . CHF (congestive heart failure)    Past Surgical History  Procedure Laterality Date  . Total abdominal hysterectomy w/ bilateral salpingoophorectomy    . Nissen fundoplication     Family History  Problem Relation Age of Onset  . Heart disease Mother 4773  Deceased from MI at 59yo. Hypertension Mother   . Heart disease Father 5459  Deceased of MI age 575yo. Hypertension Father   . Hypertension Brother   . Lung cancer      Grandmother   History   Social History  . Marital Status: Single    Spouse Name: N/A    Number of Children: N/A  . Years of Education: N/A   Occupational History  . Not on file.   Social History Main Topics  . Smoking status: Current Every Day Smoker -- 0.50 packs/day for 40 years    Types: Cigarettes  . Smokeless tobacco: Never Used     Comment: 2 per day  . Alcohol Use: No  . Drug Use: No  . Sexual Activity: Not on file   Other Topics Concern  . Not on file   Social History Narrative   Formerly worked as a caScientist, water qualitynow disabled.   Divorced.   2 grown children.   Lives with her grandson.    Review of Systems: Constitutional: no fevers/chills Ears, nose, mouth, throat, and face: +cough Respiratory: +shortness of breath Cardiovascular: no chest pain Gastrointestinal: no nausea/vomiting, no abdominal pain Genitourinary: no trouble  urinating Hematologic/lymphatic: +edema   Physical Exam: Blood pressure 134/78, pulse 88, temperature 97.4 F (36.3 C), temperature source Oral, resp. rate 28, height _0  (1.651 m), weight 260 lb (117.935 kg), SpO2 96.00%. General Apperance: lying bed with BiPAP on  Head: Normocephalic, atraumatic Eyes: PERRL, EOMI, anicteric sclera Ears: Normal external ear canal Nose: Nares normal, septum midline, mucosa normal Throat: Lips, mucosa and tongue normal  Neck: Supple, trachea midline Back: No tenderness or bony abnormality  Lungs: Clear to auscultation bilaterally. No wheezes, rhonchi or rales. Limited by ongoing BiPAP therapy Chest Wall: Nontender, no deformity Heart: Regular rate and rhythm,  no murmur/rub/gallop. Limited by ongoing BiPAP therapy and body habitus. Abdomen: Soft, nontender, nondistended, no rebound/guarding Extremities: Normal, atraumatic, warm and well perfused, 3+ pitting edema pretibial  Pulses: 2+ throughout Skin: No rashes or lesions Neurologic: Somnolent but arousable and able to nod responses to questions.   Lab results: Basic Metabolic Panel:  Recent Labs  01/26/14 2138  NA 141  K 4.5  CL 93*  CO2 45*  GLUCOSE 114*  BUN 9  CREATININE 0.79  CALCIUM 9.3   Liver Function Tests:  Recent Labs  01/26/14 2138  AST 16  ALT 11  ALKPHOS 103  BILITOT 0.8  PROT 6.7  ALBUMIN 3.2*   CBC:  Recent Labs  01/26/14 2138  WBC 6.9  NEUTROABS 4.9  HGB 13.3  HCT 45.0  MCV 100.9*  PLT 147*   Cardiac Enzymes:  Recent Labs  01/26/14 2150  TROPONINI <0.30   BNP:  Recent Labs  01/26/14 2138  PROBNP 2153.0*    Imaging results:  Dg Chest 2 View  01/26/2014   CLINICAL DATA:  Initial evaluation for shortness of breath, cough. History of COPD. Smoker.  EXAM: CHEST  2 VIEW  COMPARISON:  Prior radiograph from 12/22/2013  FINDINGS: Study is limited by technique patient is rotated to the right. Cardiomegaly is grossly stable from prior exam.  Mediastinal silhouette within normal limits.  Changes suggesting COPD again seen. No focal infiltrate to suggest acute infectious pneumonitis. There is mild diffuse pulmonary vascular congestion without frank pulmonary edema. No pleural effusion. No pneumothorax.  No acute osseus abnormality. Surgical clips overlie the GE junction.  IMPRESSION: 1. Cardiomegaly with mild diffuse pulmonary vascular congestion without overt pulmonary edema. 2. COPD.   Electronically Signed   By: Jeannine Boga M.D.   On: 01/26/2014 21:39    Other results: EKG: Normal sinus rhythm. T wave inversion in II, III, aVF, V3, V4 seen on previous EKG. New T wave inversion in aVR, V6.  Assessment & Plan by Problem: Principal Problem:   Acute on chronic respiratory failure with hypercapnia Active Problems:   TOBACCO ABUSE   COPD, severe   Hyperlipidemia   Chronic diastolic CHF (congestive heart failure)  Acute on chronic hypercarbic hypoxic respiratory failure: Concerned for COPD exacerbation. Infection unlikely as she is afebrile with no leukocytosis. Cr stable - unlikely to be dehydrated. Low risk for PE by Wells and Geneva score. She has a history of grade 1 diastolic CHF (last echo 12/09/6732 - EF 55-60%). Her weight is stable from her clinic visit on 1021/2015. She is noncompliant with her Lasix therapy at home. Her Pro BNP is elevated to 2153. CXR with mild diffuse pulmonary vascular congestion without overt pulmonary edema. In the ED, an ABG found her pCO2 to be 100. She was started on BiPAP.  -Check TSH -Check Phos, Mag -Continue BiPAP -Wean as tolerated -Solumedrol 160m IV x1 -Levaquin 752mdaily -continue home symbicort 160-4.5 mcg 1 puff daily -continue home advair as dulera 200-5 mcg 2 puff BID -Duoneb 48m10m4hr -Lasix 40m248m x1 -2D echo in AM -Repeat ABG at 4AM -home spiriva 18mc648mily held  Diastolic CHF: Last echo 11/08/19/12/3788 55-60%. Her weight is stable from her clinic visit on 1021/2015. She  is noncompliant with her Lasix therapy at home. Her Pro BNP is elevated to 2153. ABG with nonspecific T wave abnormalities. Initial troponin negative. -Hold home lasix 60mg 69maily -Daily weight, strict i/os -Lasix 40mg I6m -2D echo in AM -Repeat EKG in AM -  Trend troponin  HLD:   -Hold home lovastatin 55m daily  FEN:  -NPO   DVT prophylaxis: subq heparin 5000u TID  Dispo: Disposition is deferred at this time, awaiting improvement of current medical problems. Anticipated discharge in approximately 2-3 day(s).   The patient does have a current PCP (Jessee Avers MD) and [facility].  The patient does not have transportation limitations that hinder transportation to clinic appointments.  Signed: JJacques Earthly MD 01/27/2014, 1:08 AM

## 2014-01-27 NOTE — H&P (Signed)
  Date: 01/27/2014  Patient name: [name]  Medical record number: [id number]  Date of birth: [birth year]   I have seen and evaluated [name] and discussed their care with the Residency Team.  Briefly, [name] is a 58yo woman with severe COPD on home oxygen, grade 1 dCHF, pHTN, CAD, HLD, tobacco use who presents with SOB, worsening over past week, requiring increased use of albuterol, however, this was not working.  She does note that she has been taking her medications at home.  When I asked her what changed to bring her in, she stated "I keep smoking and the weather change."  She does not note any URI or viral symptoms.  She has a cough as well.  Her ABG on admission showed acidosis with a very high CO2, she was placed on bipap, given steroids and levaquin.  It does not appear that she is having a concominant acute heart failure exacerbation given CXR.  I reviewed her chart, as her home meds note 2 inhaled steroids (Advair and symbicort).  She was prescribed symbicort a while ago (March 2015) however, it appears from chart review that she was actually taking Advair and her chart was appropriately updated.  However, on this admission when med reconciled by pharmacy tech, both medications were added to the list.  It is unclear if patient was taking both or merely had both with her on admission as I am not sure a thorough medication review could have been done based on her state at admission.   As her mental status and breathing improves, a thorough medication review will need to be done.    [name] did complain as well with hand jerking bilaterally, ocasional numbness and claw like contracture in her hands.  She notes that she has trouble holding things and is scared she will drop them.  I reviewed Dr. Arsenio Katz note where she complained of similar things.  NCS were considered, but were not ordered at that time.  This was [name] main concern.    Assessment and Plan: I have seen and  evaluated the patient as outlined above. I agree with the formulated Assessment and Plan as detailed in the residents' admission note, with the following changes:   1. Acute on chronic hypercarbic respiratory failure - Likely related to COPD exacerbation, possibly due to weather change or a viral illness (few symptoms to support though).  - Continue Bipap, monitor CO2 with ABG and attempt to wean as tolerated - Breathing treatments will likely not be consistently done with mask on, recommence once weaned off bipap  - Solumedrol 61m IV BID today, consider switch to oral prednisone tomorrow - Levaquin - D/C symbicort, only advair - Lasix X 1 - Consider repeating TTE given acute change and last was > 1 year ago.   2. Chronic diastolic heart failure.  - I am more inclined at this point to attribute her symptoms to COPD as noted above, given stable weight, only mild increase in BNP and no over fluid on CXR - Consider repeat TTE given > 1 year since last - Daily weight, i/o's - Trend TnI  3. Hand jerking/cramps - Would monitor for electrolyte abnormality - Unlikely to get NCS while here, but agree that this is likely the next step in work up - Once patient able to be weaned from [place] will investigate further  DNR/DNI, confirmed to resident team.    ESid Falcon MD 10/26/201512:45 PM

## 2014-01-27 NOTE — Progress Notes (Signed)
Pt's daughter Nira Conn called back said she would be up later in the morning will continue to monitor

## 2014-01-27 NOTE — Progress Notes (Signed)
58yo female c/o progressive SOB w/ productive cough since yesterday, still abuses tobacco, initial CXR negative, to begin IV ABX for severe COPD exacerbation.  Will begin Levaquin 767m IV Q24H for CrCl ~1014mmin and monitor CBC and Cx.  VeWynona NeatPharmD, BCPS 01/27/2014 3:20 AM

## 2014-01-27 NOTE — Progress Notes (Signed)
Dr. Eulas Post on the Floor made aware of patients Critical Abg, also dicussed with MD pts code status md has been unable to get family to notify will continue to monitor

## 2014-01-28 ENCOUNTER — Inpatient Hospital Stay (HOSPITAL_COMMUNITY): Payer: Medicare HMO

## 2014-01-28 DIAGNOSIS — M79643 Pain in unspecified hand: Secondary | ICD-10-CM

## 2014-01-28 DIAGNOSIS — R253 Fasciculation: Secondary | ICD-10-CM

## 2014-01-28 DIAGNOSIS — I2510 Atherosclerotic heart disease of native coronary artery without angina pectoris: Secondary | ICD-10-CM

## 2014-01-28 DIAGNOSIS — I272 Other secondary pulmonary hypertension: Secondary | ICD-10-CM

## 2014-01-28 LAB — BLOOD GAS, ARTERIAL
Acid-Base Excess: 18.8 mmol/L — ABNORMAL HIGH (ref 0.0–2.0)
Bicarbonate: 45.2 mEq/L — ABNORMAL HIGH (ref 20.0–24.0)
Drawn by: 31101
FIO2: 0.55 %
O2 Saturation: 89.4 %
PH ART: 7.369 (ref 7.350–7.450)
Patient temperature: 98.6
TCO2: 47.7 mmol/L (ref 0–100)
pCO2 arterial: 80.3 mmHg (ref 35.0–45.0)
pO2, Arterial: 58.5 mmHg — ABNORMAL LOW (ref 80.0–100.0)

## 2014-01-28 LAB — URINALYSIS, ROUTINE W REFLEX MICROSCOPIC
Glucose, UA: NEGATIVE mg/dL
Hgb urine dipstick: NEGATIVE
Ketones, ur: 15 mg/dL — AB
LEUKOCYTES UA: NEGATIVE
Nitrite: NEGATIVE
PROTEIN: NEGATIVE mg/dL
Specific Gravity, Urine: 1.02 (ref 1.005–1.030)
UROBILINOGEN UA: 1 mg/dL (ref 0.0–1.0)
pH: 7.5 (ref 5.0–8.0)

## 2014-01-28 LAB — BASIC METABOLIC PANEL
ANION GAP: 9 (ref 5–15)
BUN: 20 mg/dL (ref 6–23)
CALCIUM: 9.5 mg/dL (ref 8.4–10.5)
CO2: 43 meq/L — AB (ref 19–32)
CREATININE: 0.81 mg/dL (ref 0.50–1.10)
Chloride: 91 mEq/L — ABNORMAL LOW (ref 96–112)
GFR calc Af Amer: >90 mL/min (ref >90)
GFR calc non Af Amer: 78 mL/min — ABNORMAL LOW (ref >90)
Glucose, Bld: 195 mg/dL — ABNORMAL HIGH (ref 70–99)
Potassium: 4.1 mEq/L (ref 3.7–5.3)
Sodium: 143 mEq/L (ref 137–147)

## 2014-01-28 MED ORDER — FUROSEMIDE 10 MG/ML PO SOLN
40.0000 mg | Freq: Once | ORAL | Status: AC
  Administered 2014-01-28: 40 mg via ORAL
  Filled 2014-01-28: qty 4

## 2014-01-28 MED ORDER — FUROSEMIDE 8 MG/ML PO SOLN
40.0000 mg | Freq: Once | ORAL | Status: DC
  Filled 2014-01-28: qty 5

## 2014-01-28 MED ORDER — ACETAMINOPHEN 325 MG PO TABS
650.0000 mg | ORAL_TABLET | Freq: Once | ORAL | Status: AC | PRN
  Administered 2014-01-28: 650 mg via ORAL
  Filled 2014-01-28: qty 2

## 2014-01-28 NOTE — Progress Notes (Signed)
Placed pt. Back on bipap.

## 2014-01-28 NOTE — Progress Notes (Addendum)
Subjective: Overnight, she was weaned off BiPAP but required it again when O2 dropped to low 80s. She also required a Foley overnight for urinary retention. Repeat UA was showed ketones but was otherwise unremarkable.  This AM, she would like to try PO intake as she hasn't eaten since Saturday.   Yesterday, she expressed to me that if she is unable to express her preferences for medical decision making, she would like her children (Heather & Catalina Antigua) and grandson Beverely Low) to be involved.   Objective: Vital signs in last 24 hours: Filed Vitals:   01/28/14 0301 01/28/14 0500 01/28/14 0508 01/28/14 0650  BP:      Pulse:   78   Temp:      TempSrc:      Resp:   15   Height:      Weight:  261 lb 0.4 oz (118.4 kg)    SpO2: 95%  95% 82%   Weight change: 1 lb 0.4 oz (0.465 kg)  Intake/Output Summary (Last 24 hours) at 01/28/14 0701 Last data filed at 01/28/14 0500  Gross per 24 hour  Intake      0 ml  Output   1125 ml  Net  -1125 ml   General: resting in bed, wearing BiPAP mask HEENT: PERRL, EOMI, no scleral icterus Cardiac: RRR, no rubs, murmurs or gallops Pulm: difficult to appreciate breath sounds on posterior auscultation Abd: soft, nontender, nondistended, BS present Ext: warm and well perfused, no pedal edema Neuro: alert and oriented X3, cranial nerves II-XII grossly intact, strength and sensation to light touch equal in bilateral upper and lower extremities  Lab Results: Basic Metabolic Panel:  Recent Labs Lab 01/22/14 1444  01/27/14 0407 01/28/14 0359  NA 143  < > 146 143  K 4.4  < > 4.2 4.1  CL 94*  < > 92* 91*  CO2 >45*  < > 45* 43*  GLUCOSE 85  < > 175* 195*  BUN 8  < > 9 20  CREATININE 0.78  < > 0.80 0.81  CALCIUM 8.7  < > 9.4 9.5  MG 1.9  --  1.9  --   PHOS  --   --  4.8*  --   < > = values in this interval not displayed. CBC:  Recent Labs Lab 01/26/14 2138 01/27/14 0407  WBC 6.9 6.6  NEUTROABS 4.9  --   HGB 13.3 13.6  HCT 45.0 46.4*  MCV 100.9*  102.0*  PLT 147* 123*   Cardiac Enzymes:  Recent Labs Lab 01/27/14 0407 01/27/14 1003 01/27/14 1715  TROPONINI <0.30 <0.30 <0.30   Thyroid Function Tests:  Recent Labs Lab 01/27/14 0407  TSH 2.060   Medications: I have reviewed the patient's current medications. Scheduled Meds: . heparin  5,000 Units Subcutaneous 3 times per day  . ipratropium-albuterol  3 mL Nebulization Q4H  . levofloxacin (LEVAQUIN) IV  750 mg Intravenous Q24H  . methylPREDNISolone (SOLU-MEDROL) injection  80 mg Intravenous Q12H  . mometasone-formoterol  2 puff Inhalation BID  . pneumococcal 23 valent vaccine  0.5 mL Intramuscular Tomorrow-1000  . sodium chloride  3 mL Intravenous Q12H  . sodium chloride  3 mL Intravenous Q12H   Continuous Infusions:  PRN Meds:.sodium chloride, ondansetron (ZOFRAN) IV, ondansetron, polyvinyl alcohol, sodium chloride Assessment/Plan: Principal Problem:   Acute on chronic respiratory failure with hypercapnia Active Problems:   TOBACCO ABUSE   COPD, severe   Hyperlipidemia   Chronic diastolic CHF (congestive heart failure)  Ms. Piercey is  58 year old with COPD, CHF, pulmonary HTN, CAD, HLD, tobacco abuse who presents today with acute-on-chronic hypoxic hypercarbic respiratory failure who is hospitalized for acute-on-chronic hypercarbic hypercapnic respiratory failure and complains of hand pain/twitches.  Acute-on-chronic hypercarbic hypercapnic respiratory failure: Likely 2/2 COPD exacerbation from non-adherence to treatment. Repeat ABG shows improved CO2 though persistent respiratory acidosis and metabolic alkalosis.  -Continue Solumedrol 71m IV BID -Continue Levaquin 7529m(Day 2/5) -Continue Duonebs, Dulera as she can tolerate with mask  Hand pain/twitches: Unclear etiology though likely two separate processes. Not considered to be arthritic in nature per her last clinic note.  -Order hand XR -Continue assessing  CHF: Weight 261lbs, up from 256lbs yesterday.   -Give Lasix 4054mV and reassess tomorrow -Continue daily weights, strict I&Os  HLD: Hold home meds until she can tolerate PO intake.  #FEN:  -Diet: Heart Healthy  #DVT prophylaxis: heparin 5000 units subcutaneous  #CODE STATUS: DNR/DNI  Dispo: Disposition is deferred at this time, awaiting improvement of current medical problems.  Anticipated discharge in approximately 1-2 day(s).   The patient does have a current PCP (RiJessee AversD) and [facility].  The patient does have transportation limitations that hinder transportation to clinic appointments.  .Services Needed at time of discharge: Y = Yes, Blank = No PT:   OT:   RN:   Equipment:   Other:     LOS: 2 days   RusCharlott RakesD 01/28/2014, 7:01 AM

## 2014-01-28 NOTE — Progress Notes (Signed)
Utilization review completed

## 2014-01-28 NOTE — Progress Notes (Signed)
Pt  Has not voided since approxate 1400 hrs bladder scanned pt had a volume of greater than 650. Pt attempted to void multiple times with out success.  Placed foley per previous order with Toney Sang RN  to assist, pt tolerated well UA/ Culture sent per protocol.

## 2014-01-28 NOTE — Progress Notes (Signed)
  Date: 01/28/2014  Patient name: [name]  Medical record number: [id number]  Date of birth: 30-Apr-1955   This patient has been seen and the plan of care was discussed with the house staff. Please see their note for complete details. I concur with their findings.  Sid Falcon, MD 01/28/2014, 3:22 PM

## 2014-01-28 NOTE — Telephone Encounter (Signed)
Encounter closed

## 2014-01-29 DIAGNOSIS — R410 Disorientation, unspecified: Secondary | ICD-10-CM

## 2014-01-29 LAB — BASIC METABOLIC PANEL
Anion gap: 7 (ref 5–15)
BUN: 32 mg/dL — ABNORMAL HIGH (ref 6–23)
CALCIUM: 9.6 mg/dL (ref 8.4–10.5)
CHLORIDE: 91 meq/L — AB (ref 96–112)
CO2: 43 meq/L — AB (ref 19–32)
CREATININE: 0.93 mg/dL (ref 0.50–1.10)
GFR calc non Af Amer: 66 mL/min — ABNORMAL LOW (ref >90)
GFR, EST AFRICAN AMERICAN: 76 mL/min — AB (ref >90)
Glucose, Bld: 170 mg/dL — ABNORMAL HIGH (ref 70–99)
Potassium: 4.2 mEq/L (ref 3.7–5.3)
SODIUM: 141 meq/L (ref 137–147)

## 2014-01-29 LAB — URINE CULTURE
COLONY COUNT: NO GROWTH
Culture: NO GROWTH
Special Requests: NORMAL

## 2014-01-29 LAB — BLOOD GAS, ARTERIAL
Acid-Base Excess: 17.9 mmol/L — ABNORMAL HIGH (ref 0.0–2.0)
BICARBONATE: 43.9 meq/L — AB (ref 20.0–24.0)
Delivery systems: POSITIVE
Drawn by: 252031
EXPIRATORY PAP: 8
FIO2: 0.55 %
Inspiratory PAP: 20
O2 SAT: 95.5 %
PATIENT TEMPERATURE: 98.6
TCO2: 46.2 mmol/L (ref 0–100)
pCO2 arterial: 74.7 mmHg (ref 35.0–45.0)
pH, Arterial: 7.387 (ref 7.350–7.450)
pO2, Arterial: 83.3 mmHg (ref 80.0–100.0)

## 2014-01-29 MED ORDER — IPRATROPIUM-ALBUTEROL 0.5-2.5 (3) MG/3ML IN SOLN
3.0000 mL | Freq: Three times a day (TID) | RESPIRATORY_TRACT | Status: DC
  Administered 2014-01-29 – 2014-02-02 (×12): 3 mL via RESPIRATORY_TRACT
  Filled 2014-01-29 (×14): qty 3

## 2014-01-29 MED ORDER — PREDNISONE 20 MG PO TABS
40.0000 mg | ORAL_TABLET | Freq: Two times a day (BID) | ORAL | Status: DC
  Administered 2014-01-29 – 2014-01-30 (×2): 40 mg via ORAL
  Filled 2014-01-29 (×4): qty 2

## 2014-01-29 NOTE — Progress Notes (Signed)
CRITICAL VALUE ALERT  Critical value received:  CO2-43  Date of notification:  01/29/14  Time of notification:  8333  Critical value read back:Yes.    Nurse who received alert:  S.Young,RN  MD notified (1st page):  Gareth Morgan, MD at bedside  Time of first page:  0356  MD notified (2nd page):  Time of second page:  Responding MD:    Time MD responded:

## 2014-01-29 NOTE — Progress Notes (Signed)
Subjective: Overnight, she was put back on BiPAP with worsening agitation and disorientation. Repeat ABG showed improved pH and CO2.  This AM, she was oriented to person [facility]) and place [facility]) but not to date (November 2nd, 2015). She appeared very sleepy and noted a poor night.   Objective: Vital signs in last 24 hours: Filed Vitals:   01/29/14 0306 01/29/14 0625 01/29/14 0727 01/29/14 0743  BP: 149/78   157/93  Pulse: 76   81  Temp: 98.5 F (36.9 C)  96.6 F (35.9 C)   TempSrc: Axillary  Axillary   Resp: 19   27  Height:      Weight:  248 lb 7.3 oz (112.7 kg)    SpO2: 90%   96%   Weight change: -12 lb 9.1 oz (-5.7 kg)  Intake/Output Summary (Last 24 hours) at 01/29/14 1114 Last data filed at 01/29/14 0730  Gross per 24 hour  Intake    270 ml  Output   1750 ml  Net  -1480 ml   General: resting in bed, wearing O2 mask off BiPAP HEENT: PERRL, EOMI, no scleral icterus Cardiac: RRR, no rubs, murmurs or gallops Pulm: faint bibasilar crackles Abd: soft, nontender, nondistended, BS present Ext: warm and well perfused, 1+ trace pitting edema Neuro: alert and oriented x 2 as noted in HPI, cranial nerves II-XII grossly intact, strength and sensation to light touch equal in bilateral upper and lower extremities  Lab Results: Basic Metabolic Panel:  Recent Labs Lab 01/22/14 1444  01/27/14 0407 01/28/14 0359 01/29/14 0304  NA 143  < > 146 143 141  K 4.4  < > 4.2 4.1 4.2  CL 94*  < > 92* 91* 91*  CO2 >45*  < > 45* 43* 43*  GLUCOSE 85  < > 175* 195* 170*  BUN 8  < > 9 20 32*  CREATININE 0.78  < > 0.80 0.81 0.93  CALCIUM 8.7  < > 9.4 9.5 9.6  MG 1.9  --  1.9  --   --   PHOS  --   --  4.8*  --   --   < > = values in this interval not displayed.  CBC:  Recent Labs Lab 01/26/14 2138 01/27/14 0407  WBC 6.9 6.6  NEUTROABS 4.9  --   HGB 13.3 13.6  HCT 45.0 46.4*  MCV 100.9* 102.0*  PLT 147* 123*   Cardiac Enzymes:  Recent Labs Lab  01/27/14 0407 01/27/14 1003 01/27/14 1715  TROPONINI <0.30 <0.30 <0.30   Thyroid Function Tests:  Recent Labs Lab 01/27/14 0407  TSH 2.060   Medications: I have reviewed the patient's current medications. Scheduled Meds: . heparin  5,000 Units Subcutaneous 3 times per day  . ipratropium-albuterol  3 mL Nebulization TID  . levofloxacin (LEVAQUIN) IV  750 mg Intravenous Q24H  . methylPREDNISolone (SOLU-MEDROL) injection  80 mg Intravenous Q12H  . mometasone-formoterol  2 puff Inhalation BID  . pneumococcal 23 valent vaccine  0.5 mL Intramuscular Tomorrow-1000  . sodium chloride  3 mL Intravenous Q12H  . sodium chloride  3 mL Intravenous Q12H   Continuous Infusions:  PRN Meds:.sodium chloride, ondansetron (ZOFRAN) IV, ondansetron, polyvinyl alcohol, sodium chloride Assessment/Plan: Principal Problem:   Acute on chronic respiratory failure with hypercapnia Active Problems:   TOBACCO ABUSE   COPD, severe   Hyperlipidemia   Chronic diastolic CHF (congestive heart failure)  Ms. Bark is 58 year old with COPD, CHF, pulmonary HTN, CAD, HLD, tobacco  abuse who presents today with acute-on-chronic hypoxic hypercarbic respiratory failure who is hospitalized for acute-on-chronic hypercarbic hypercapnic respiratory failure now with confusion.  Confusion: Possibly delirium. Possibly related to receiving IV steroids overnight. Less likely alcohol withdrawal given no reported history. Less likely 2/2 hypercarbia given recent ABG.  -Encourage her to stay awake during the day with environmental cues -Communicate with caregivers -Transition IV solumedrol->prednisone 57m BID PO  Acute-on-chronic hypercarbic hypercapnic respiratory failure: Likely 2/2 COPD exacerbation from non-adherence to treatment. Repeat ABG shows improved CO2 though persistent respiratory acidosis and metabolic alkalosis 2/2 post-hypercapnic respiratory failure.  -Switch steroids as noted above -Continue Levaquin 7542m(Day  3/5) -Continue Duonebs, Dulera as she can tolerate with mask  Hand pain/twitches: XR unremarkable for acute findings.  -Continue assessing  CHF: Weight 248lbs, down from 261lbs yesterday; accuracy is thus questionable. She received Lasix 4016mV x 1 yesterday with net -1.6L. Higher BUN/Crt ratio suggest she is a bit more dehydrated; physical exam is not concerning for volume overload. -Continue daily weights, strict I&Os -Reassess for Lasix tomorrow  HLD: Hold home meds until she can tolerate PO intake.  #FEN:  -Diet: Heart Healthy  #DVT prophylaxis: heparin 5000 units subcutaneous  #CODE STATUS: DNR/DNI  Dispo: Disposition is deferred at this time, awaiting improvement of current medical problems.  Anticipated discharge in approximately 1-2 day(s).   The patient does have a current PCP (RiJessee AversD) and [facility].  The patient does have transportation limitations that hinder transportation to clinic appointments.  .Services Needed at time of discharge: Y = Yes, Blank = No PT:   OT:   RN:   Equipment:   Other:     LOS: 3 days   RusCharlott RakesD 01/29/2014, 11:14 AM

## 2014-01-29 NOTE — Progress Notes (Signed)
  Date: 01/29/2014  Patient name: [name]  Medical record number: [id number]  Date of birth: May 24, 1955   This patient has been seen and the plan of care was discussed with the house staff. Please see their note for complete details. I concur with their findings with the following additions/corrections:  Respiratory status is improving. She is requiring high levels of oxygen during the day and Bipap at night in the last 24 hours.  Will continue to titrate down support as possible.  ABG CO2 is improved to the 70s.  She is now having delirium which could be related to medications, sundowning, lack of sleep.  I am less inclined to think it is due to her respiratory status given this is improving daily.  Will transition to PO steroids and attempt to not given them late in the evening as this could be stimulating her and decreasing her sleep.    Sid Falcon, MD 01/29/2014, 2:53 PM

## 2014-01-30 LAB — BASIC METABOLIC PANEL
Anion gap: 4 — ABNORMAL LOW (ref 5–15)
BUN: 28 mg/dL — AB (ref 6–23)
CO2: 45 meq/L — AB (ref 19–32)
CREATININE: 0.78 mg/dL (ref 0.50–1.10)
Calcium: 9.2 mg/dL (ref 8.4–10.5)
Chloride: 93 mEq/L — ABNORMAL LOW (ref 96–112)
GFR calc Af Amer: >90 mL/min (ref >90)
GFR calc non Af Amer: 90 mL/min — ABNORMAL LOW (ref >90)
Glucose, Bld: 178 mg/dL — ABNORMAL HIGH (ref 70–99)
Potassium: 4.5 mEq/L (ref 3.7–5.3)
Sodium: 142 mEq/L (ref 137–147)

## 2014-01-30 MED ORDER — SENNOSIDES-DOCUSATE SODIUM 8.6-50 MG PO TABS
2.0000 | ORAL_TABLET | Freq: Once | ORAL | Status: AC
  Administered 2014-01-30: 2 via ORAL
  Filled 2014-01-30 (×2): qty 2

## 2014-01-30 MED ORDER — PREDNISONE 20 MG PO TABS
20.0000 mg | ORAL_TABLET | Freq: Once | ORAL | Status: AC
  Administered 2014-01-31: 20 mg via ORAL
  Filled 2014-01-30: qty 1

## 2014-01-30 NOTE — Progress Notes (Signed)
  Date: 01/30/2014  Patient name: [name]  Medical record number: [id number]  Date of birth: December 04, 1955   This patient has been seen and the plan of care was discussed with the house staff. Please see their upcoming note for complete details. I concur with their findings with the following additions/corrections:  When I saw her [name] was resting comfortably on NRB and O2 sat was 96%.  Can start weaning oxygen today as her goal O2 sat would be 88-92%.  I do not think she will likely need further BIPAP and she appears to be improving with therapy for her acute COPD exacerbation.    Sid Falcon, MD 01/30/2014, 3:18 PM

## 2014-01-30 NOTE — Progress Notes (Addendum)
Subjective: Overnight, she had retained about 500cc of urine. This AM, she was oriented to person [facility]) and place [facility]) but not to date (12/31/2013).   Objective: Vital signs in last 24 hours: Filed Vitals:   01/30/14 1100 01/30/14 1415 01/30/14 1416 01/30/14 1500  BP:      Pulse:   66   Temp: 97.8 F (36.6 C)   98.6 F (37 C)  TempSrc: Axillary   Axillary  Resp:   13   Height:      Weight:      SpO2:  94% 95%    Weight change: 3 lb 1.4 oz (1.4 kg)  Intake/Output Summary (Last 24 hours) at 01/30/14 1530 Last data filed at 01/30/14 0930  Gross per 24 hour  Intake    360 ml  Output    750 ml  Net   -390 ml   General: resting in bed, wearing BiPAP mask HEENT: PERRL, EOMI, no scleral icterus Cardiac: RRR, no rubs, murmurs or gallops Pulm: unable to auscultate Abd: soft, nontender, nondistended, BS present Ext: warm and well perfused, 1+ trace pitting edema otherwise stable from yesterday Neuro: alert and oriented x 2 as noted in HPI, cranial nerves II-XII grossly intact, strength and sensation to light touch equal in bilateral upper and lower extremities  Lab Results: Basic Metabolic Panel:  Recent Labs Lab 01/27/14 0407  01/29/14 0304 01/30/14 0255  NA 146  < > 141 142  K 4.2  < > 4.2 4.5  CL 92*  < > 91* 93*  CO2 45*  < > 43* 45*  GLUCOSE 175*  < > 170* 178*  BUN 9  < > 32* 28*  CREATININE 0.80  < > 0.93 0.78  CALCIUM 9.4  < > 9.6 9.2  MG 1.9  --   --   --   PHOS 4.8*  --   --   --   < > = values in this interval not displayed.  CBC:  Recent Labs Lab 01/26/14 2138 01/27/14 0407  WBC 6.9 6.6  NEUTROABS 4.9  --   HGB 13.3 13.6  HCT 45.0 46.4*  MCV 100.9* 102.0*  PLT 147* 123*   Cardiac Enzymes:  Recent Labs Lab 01/27/14 0407 01/27/14 1003 01/27/14 1715  TROPONINI <0.30 <0.30 <0.30   Thyroid Function Tests:  Recent Labs Lab 01/27/14 0407  TSH 2.060   Medications: I have reviewed the patient's current  medications. Scheduled Meds: . heparin  5,000 Units Subcutaneous 3 times per day  . ipratropium-albuterol  3 mL Nebulization TID  . levofloxacin (LEVAQUIN) IV  750 mg Intravenous Q24H  . mometasone-formoterol  2 puff Inhalation BID  . pneumococcal 23 valent vaccine  0.5 mL Intramuscular Tomorrow-1000  . [START ON 01/31/2014] predniSONE  20 mg Oral Once  . sodium chloride  3 mL Intravenous Q12H  . sodium chloride  3 mL Intravenous Q12H   Continuous Infusions:  PRN Meds:.sodium chloride, ondansetron (ZOFRAN) IV, ondansetron, polyvinyl alcohol, sodium chloride Assessment/Plan: Principal Problem:   Acute on chronic respiratory failure with hypercapnia Active Problems:   TOBACCO ABUSE   COPD, severe   Hyperlipidemia   Chronic diastolic CHF (congestive heart failure)  Ms. Hasten is 58 year old with COPD, CHF, pulmonary HTN, CAD, HLD, tobacco abuse who presents today with acute-on-chronic hypoxic hypercarbic respiratory failure who is hospitalized for acute-on-chronic hypercarbic hypercapnic respiratory failure with intermittent delirium.  Intermittent delirium: Last ABG makes hypercapnia less likely as cause of her confusion. Infection  less likely given vital signs and otherwise unremarkable UA on admission. Steroid effect possible though she has never had similar effects on prednisone in the past.   -Encourage her to stay awake during the day with environmental cues -Communicate with caregivers  Acute-on-chronic hypercarbic hypercapnic respiratory failure: Likely 2/2 COPD exacerbation from non-adherence to treatment.  -Transition off BiPAP today -Taper prednisone 52m -> prednisone 241mtomorrow -Continue Levaquin 75062mDay 4/5) -Continue Duonebs, Dulera  Hand pain/twitches: XR unremarkable for acute findings.  -Continue assessing  CHF: Weight 251lbs, up from 248lbs yesterday; BUN/Crt ratio continues to suggest that she is not volume overloaded. -Continue daily weights, strict  I&Os -Reassess for Lasix tomorrow  HLD: Hold home meds until she can tolerate PO intake.  #FEN:  -Diet: Heart Healthy  #DVT prophylaxis: heparin 5000 units subcutaneous  #CODE STATUS: DNR/DNI  Dispo: Disposition is deferred at this time, awaiting improvement of current medical problems.  Anticipated discharge in approximately 1-2 day(s).   The patient does have a current PCP (RiJessee AversD) and [facility].  The patient does have transportation limitations that hinder transportation to clinic appointments.  .Services Needed at time of discharge: Y = Yes, Blank = No PT:   OT:   RN:   Equipment:   Other:     LOS: 4 days   RusCharlott RakesD 01/30/2014, 3:30 PM

## 2014-01-30 NOTE — Progress Notes (Signed)
Pt stated she was doing fine on Venturi Device and did not want to go on BiPAP at this time. RT will continue to monitor.

## 2014-01-31 DIAGNOSIS — J441 Chronic obstructive pulmonary disease with (acute) exacerbation: Principal | ICD-10-CM

## 2014-01-31 LAB — CBC
HEMATOCRIT: 46.1 % — AB (ref 36.0–46.0)
Hemoglobin: 14.2 g/dL (ref 12.0–15.0)
MCH: 30.3 pg (ref 26.0–34.0)
MCHC: 30.8 g/dL (ref 30.0–36.0)
MCV: 98.5 fL (ref 78.0–100.0)
Platelets: 102 10*3/uL — ABNORMAL LOW (ref 150–400)
RBC: 4.68 MIL/uL (ref 3.87–5.11)
RDW: 14.4 % (ref 11.5–15.5)
WBC: 8.4 10*3/uL (ref 4.0–10.5)

## 2014-01-31 LAB — BASIC METABOLIC PANEL
BUN: 27 mg/dL — AB (ref 6–23)
CHLORIDE: 97 meq/L (ref 96–112)
CREATININE: 0.81 mg/dL (ref 0.50–1.10)
Calcium: 9.1 mg/dL (ref 8.4–10.5)
GFR calc non Af Amer: 78 mL/min — ABNORMAL LOW (ref >90)
GLUCOSE: 109 mg/dL — AB (ref 70–99)
POTASSIUM: 3.8 meq/L (ref 3.7–5.3)
Sodium: 146 mEq/L (ref 137–147)

## 2014-01-31 MED ORDER — LORAZEPAM 2 MG/ML IJ SOLN: 1.0000 mg | INTRAMUSCULAR | Status: DC | PRN

## 2014-01-31 MED ORDER — PREDNISONE 20 MG PO TABS
20.0000 mg | ORAL_TABLET | Freq: Once | ORAL | Status: AC
  Administered 2014-02-01: 20 mg via ORAL
  Filled 2014-01-31: qty 1

## 2014-01-31 NOTE — Progress Notes (Signed)
CRITICAL VALUE ALERT  Critical value received:  CO2 >45  Date of notification:  01/31/2014  Time of notification:  9450  Critical value read back:Yes.    Nurse who received alert:  K. Royden Purl, RN  MD notified (1st page):  Dr. Randell Patient  Time of first page:  [phone]  MD notified (2nd page):  Time of second page:  Responding MD:  Dr. Randell Patient  Time MD responded:  [phone]  No new orders received.

## 2014-01-31 NOTE — Progress Notes (Signed)
Subjective: She did not require BiPAP overnight and was maintained on 14L O2 this AM. Upon assessing orientation, she correctly stated the name, place, and date.   Objective: Vital signs in last 24 hours: Filed Vitals:   01/31/14 0405 01/31/14 0700 01/31/14 0915 01/31/14 1100  BP:      Pulse:      Temp:  97.5 F (36.4 C)  98.5 F (36.9 C)  TempSrc:  Oral  Oral  Resp:      Height:      Weight: 246 lb 11.1 oz (111.9 kg)     SpO2:   96%    Weight change: -4 lb 13.6 oz (-2.2 kg)  Intake/Output Summary (Last 24 hours) at 01/31/14 1213 Last data filed at 01/31/14 1100  Gross per 24 hour  Intake    753 ml  Output   1050 ml  Net   -297 ml   General: resting in bed, wearing Venturi mask HEENT: PERRL, EOMI, no scleral icterus Cardiac: RRR, no rubs, murmurs or gallops Pulm: fine bibasilar crackles Abd: soft, nontender, nondistended, BS present Ext: warm and well perfused, 1+ trace pitting edema otherwise stable from yesterday Neuro: alert and oriented x 3 as noted in HPI, cranial nerves II-XII grossly intact, strength and sensation to light touch equal in bilateral upper and lower extremities  Lab Results: Basic Metabolic Panel:  Recent Labs Lab 01/27/14 0407  01/30/14 0255 01/31/14 0232  NA 146  < > 142 146  K 4.2  < > 4.5 3.8  CL 92*  < > 93* 97  CO2 45*  < > 45* >45*  GLUCOSE 175*  < > 178* 109*  BUN 9  < > 28* 27*  CREATININE 0.80  < > 0.78 0.81  CALCIUM 9.4  < > 9.2 9.1  MG 1.9  --   --   --   PHOS 4.8*  --   --   --   < > = values in this interval not displayed.  CBC:  Recent Labs Lab 01/26/14 2138 01/27/14 0407 01/31/14 0232  WBC 6.9 6.6 8.4  NEUTROABS 4.9  --   --   HGB 13.3 13.6 14.2  HCT 45.0 46.4* 46.1*  MCV 100.9* 102.0* 98.5  PLT 147* 123* 102*   Cardiac Enzymes:  Recent Labs Lab 01/27/14 0407 01/27/14 1003 01/27/14 1715  TROPONINI <0.30 <0.30 <0.30   Thyroid Function Tests:  Recent Labs Lab 01/27/14 0407  TSH 2.060    Medications: I have reviewed the patient's current medications. Scheduled Meds: . heparin  5,000 Units Subcutaneous 3 times per day  . ipratropium-albuterol  3 mL Nebulization TID  . levofloxacin (LEVAQUIN) IV  750 mg Intravenous Q24H  . mometasone-formoterol  2 puff Inhalation BID  . pneumococcal 23 valent vaccine  0.5 mL Intramuscular Tomorrow-1000  . sodium chloride  3 mL Intravenous Q12H  . sodium chloride  3 mL Intravenous Q12H   Continuous Infusions:  PRN Meds:.sodium chloride, ondansetron (ZOFRAN) IV, ondansetron, polyvinyl alcohol, sodium chloride Assessment/Plan: Principal Problem:   Acute on chronic respiratory failure with hypercapnia Active Problems:   TOBACCO ABUSE   COPD, severe   Hyperlipidemia   Chronic diastolic CHF (congestive heart failure)  Ms. Scritchfield is 58 year old with COPD, CHF, pulmonary HTN, CAD, HLD, tobacco abuse who presents today with acute-on-chronic hypoxic hypercarbic respiratory failure who is hospitalized for acute-on-chronic hypercarbic hypercapnic respiratory failure with improved delirium.  Improved delirium: Likely related to dysregulation of day/night cycle and perhaps irregular steroid  dosing.   -Encourage her to stay awake during the day with environmental cues -Communicate with caregivers -Continue prednisone 72m  Acute-on-chronic hypercarbic hypercapnic respiratory failure: Likely 2/2 COPD exacerbation from non-adherence to treatment.  -Continue to wean O2 as tolerated -Continue prednisone 237mtomorrow -Continue Levaquin 75087mDay 5/5) -Continue Duonebs, Dulera  Hand pain/twitches: XR unremarkable for acute findings.  -Continue assessing  CHF: Weight 246lbs, down from 251lbs yesterday; BUN/Crt ratio does not suggest she is hypervolemic.  -Continue daily weights, strict I&Os -Defer Lasix until tomorrow  HLD: Hold home meds until she is better tolerating PO intake.  #FEN:  -Diet: Heart Healthy  #DVT prophylaxis: heparin 5000  units subcutaneous  #CODE STATUS: DNR/DNI  Dispo: Disposition is deferred at this time, awaiting improvement of current medical problems.  Anticipated discharge in approximately 1-2 day(s).   The patient does have a current PCP (RiJessee AversD) and [facility].  The patient does have transportation limitations that hinder transportation to clinic appointments.  .Services Needed at time of discharge: Y = Yes, Blank = No PT:   OT:   RN:   Equipment:   Other:     LOS: 5 days   RusCharlott RakesD 01/31/2014, 12:13 PM

## 2014-01-31 NOTE — Progress Notes (Signed)
  Date: 01/31/2014  Patient name: [name]  Medical record number: [id number]  Date of birth: 19-Nov-1955   This patient has been seen and the plan of care was discussed with the house staff. Please see their note for complete details. I concur with their findings with the following additions/corrections:  Patient off of Bipap X 24 hours now.  Still requiring high levels of oxygen by NRB.  Will request RT change her to an oximizer if possible to use nasal prongs so that she can eat and be active.  Will attempt to continue to wean her O2 today.  She likely needs to remain in SDU X 1 more day as we attempt to wean her O2 more.    Sid Falcon, MD 01/31/2014, 12:54 PM

## 2014-01-31 NOTE — Progress Notes (Signed)
Medicare Important Message given? YES  (If response is "NO", the following Medicare IM given date fields will be blank)  Date Medicare IM given: 01/31/14 Medicare IM given by:  Dahlia Client [employer]

## 2014-01-31 NOTE — Progress Notes (Signed)
Utilization review completed

## 2014-01-31 NOTE — Progress Notes (Signed)
ANTIBIOTIC CONSULT NOTE - FOLLOW UP  Pharmacy Consult for Levaquin Indication: COPD exac  Allergies  Allergen Reactions  . Fluconazole Anaphylaxis    DIFLUCAN    Patient Measurements: Height: _0  (165.1 cm) Weight: 246 lb 11.1 oz (111.9 kg) IBW/kg (Calculated) : 57 Adjusted Body Weight:   Vital Signs: Temp: 98.5 F (36.9 C) (10/30 1100) Temp Source: Oral (10/30 1100) BP: 127/72 mmHg (10/30 0346) Intake/Output from previous day: 10/29 0701 - 10/30 0700 In: 993 [P.O.:840; I.V.:3; IV Piggyback:150] Out: 900 [Urine:900] Intake/Output from this shift: Total I/O In: -  Out: 150 [Urine:150]  Labs:  Recent Labs  01/29/14 0304 01/30/14 0255 01/31/14 0232  WBC  --   --  8.4  HGB  --   --  14.2  PLT  --   --  102*  CREATININE 0.93 0.78 0.81   Estimated Creatinine Clearance: 93.3 ml/min (by C-G formula based on Cr of 0.81). No results found for this basename: Letta Median, VANCORANDOM, GENTTROUGH, GENTPEAK, GENTRANDOM, TOBRATROUGH, TOBRAPEAK, TOBRARND, AMIKACINPEAK, AMIKACINTROU, AMIKACIN,  in the last 72 hours   Microbiology: Recent Results (from the past 720 hour(s))  URINE CULTURE     Status: None   Collection Time    01/28/14  5:10 AM      Result Value Ref Range Status   Specimen Description URINE, CATHETERIZED   Final   Special Requests Normal   Final   Culture  Setup Time     Final   Value: 01/28/2014 06:25     Performed at [place]     Final   Value: NO GROWTH     Performed at [facility]   Culture     Final   Value: NO GROWTH     Performed at [facility]   Report Status 01/29/2014 FINAL   Final    Anti-infectives   Start     Dose/Rate Route Frequency Ordered Stop   01/27/14 0400  levofloxacin (LEVAQUIN) IVPB 750 mg     750 mg 100 mL/hr over 90 Minutes Intravenous Every 24 hours 01/27/14 0318        Assessment: 58 yo F with progressive SOB, productive cough x 24 hours. CXR neg for  pna.  Infectious Disease: IV abx for COPD exac. AFeb. Improving, but inc O2 needs per MD note. Will leave IV for now. Afebrile. WBC 8.4. LVQ 10/26 >> (plan 5 days)  Cardiovascular: HF, pulm HTN, CAD, HLD. BP elevated, HR WNL  Nephrology: SCr 0.8  Pulmonary: COPD exac (still smokes 2ppd) - SM 80 q12h >> prednisone, duonebs, dulera; VentiMask at 55% FiO2  Hematology / Oncology: Hgb WNL. Plts down to 102 (max 151)  PTA Medication Issues: Lasix on hold, statin, spiriva  Best Practices: Hep SQ  Goal of Therapy:  Treatment of respiratory condition  Plan:  Expected duration 5 days with resolution of temperature and/or normalization of WBC Levaquin 728m IV q24h. Dose ok for CrCl>50 Pharmacy will sign off of Levaquin protocol Please watch platelet drop on SQ heparin.  RAlford Highland CThe Timken Company10/30/2015,2:39 PM

## 2014-02-01 LAB — BASIC METABOLIC PANEL
BUN: 21 mg/dL (ref 6–23)
CO2: >45 mEq/L (ref 19–32)
CREATININE: 0.83 mg/dL (ref 0.50–1.10)
Calcium: 9 mg/dL (ref 8.4–10.5)
Chloride: 93 mEq/L — ABNORMAL LOW (ref 96–112)
GFR calc Af Amer: 88 mL/min — ABNORMAL LOW (ref >90)
GFR, EST NON AFRICAN AMERICAN: 76 mL/min — AB (ref >90)
GLUCOSE: 126 mg/dL — AB (ref 70–99)
POTASSIUM: 4 meq/L (ref 3.7–5.3)
Sodium: 143 mEq/L (ref 137–147)

## 2014-02-01 MED ORDER — PRAVASTATIN SODIUM 20 MG PO TABS
20.0000 mg | ORAL_TABLET | Freq: Every day | ORAL | Status: DC
  Administered 2014-02-01: 20 mg via ORAL
  Filled 2014-02-01 (×3): qty 1

## 2014-02-01 MED ORDER — ACETAMINOPHEN 325 MG PO TABS
650.0000 mg | ORAL_TABLET | Freq: Once | ORAL | Status: AC
  Administered 2014-02-01: 650 mg via ORAL
  Filled 2014-02-01: qty 2

## 2014-02-01 MED ORDER — PREDNISONE 10 MG PO TABS
10.0000 mg | ORAL_TABLET | Freq: Once | ORAL | Status: AC
  Administered 2014-02-02: 10 mg via ORAL
  Filled 2014-02-01 (×3): qty 1

## 2014-02-01 NOTE — Discharge Summary (Signed)
Name: [name] MRN: [id number] DOB: 1955-08-24 58 y.o. PCP: Morgan Avers, MD  Date of Admission: 01/26/2014  8:46 PM Date of Discharge: 02/02/2014 Attending Physician: Morgan Chiquito, MD  Discharge Diagnosis: Active Problems:   TOBACCO ABUSE   COPD, severe   Hyperlipidemia   Chronic diastolic CHF (congestive heart failure)  Discharge Medications:   Medication List    STOP taking these medications        budesonide-formoterol 160-4.5 MCG/ACT inhaler  Commonly known as:  SYMBICORT      TAKE these medications        albuterol 108 (90 BASE) MCG/ACT inhaler  Commonly known as:  PROVENTIL HFA;VENTOLIN HFA  Inhale 2 puffs into the lungs 3 (three) times daily as needed for wheezing or shortness of breath.     albuterol (2.5 MG/3ML) 0.083% nebulizer solution  Commonly known as:  PROVENTIL  Take 2.5 mg by nebulization daily as needed for wheezing or shortness of breath.     BC HEADACHE POWDER PO  Take 1 packet by mouth 5 (five) times daily as needed (pain/headache).     Fluticasone-Salmeterol 500-50 MCG/DOSE Aepb  Commonly known as:  ADVAIR  Inhale 2 puffs into the lungs 2 (two) times daily.     furosemide 20 MG tablet  Commonly known as:  LASIX  Take 60 mg by mouth daily.     lovastatin 20 MG tablet  Commonly known as:  MEVACOR  Take 1 tablet (20 mg total) by mouth at bedtime.     tiotropium 18 MCG inhalation capsule  Commonly known as:  SPIRIVA HANDIHALER  Place 1 capsule (18 mcg total) into inhaler and inhale daily.        Disposition and follow-up:   [name] was discharged from [facility] in Stable condition.  At the hospital follow up visit please address:  1.  COPD: adherence to treatment, f/u with pulmonology  2.  Smoking cessation  3.  Labs / imaging needed at time of follow-up: none  4.  Pending labs/ test needing follow-up: none  Follow-up Appointments: Follow-up Information    Follow up with Morgan Avers, MD.   Specialty:  Internal Medicine   Why:  We will call you with an appointment for early next week.   Contact information:   [place] [phone] [phone]       Discharge Instructions: Discharge Instructions    Diet - low sodium heart healthy    Complete by:  As directed      Discharge instructions    Complete by:  As directed   We will be calling you with an appointment for you to see [place] in clinic. You can schedule an appointment earlier if you feel SOB.   Also please continue with your prednisone taper- Take 50m tomorrow and 5 mg for 245mo days, then stop.     Increase activity slowly    Complete by:  As directed            Consultations:    Procedures Performed:  Dg Chest 2 View  01/26/2014   CLINICAL DATA:  Initial evaluation for shortness of breath, cough. History of COPD. Smoker.  EXAM: CHEST  2 VIEW  COMPARISON:  Prior radiograph from 12/22/2013  FINDINGS: Study is limited by technique patient is rotated to the right. Cardiomegaly is grossly stable from prior exam. Mediastinal silhouette within normal limits.  Changes suggesting COPD again seen. No focal infiltrate to suggest acute  infectious pneumonitis. There is mild diffuse pulmonary vascular congestion without frank pulmonary edema. No pleural effusion. No pneumothorax.  No acute osseus abnormality. Surgical clips overlie the GE junction.  IMPRESSION: 1. Cardiomegaly with mild diffuse pulmonary vascular congestion without overt pulmonary edema. 2. COPD.   Electronically Signed   By: [name] M.D.   On: 01/26/2014 21:39   Dg Hand Complete Left  01/28/2014   CLINICAL DATA:  Bilateral hand pain. No injury. Initial evaluation.  EXAM: LEFT HAND - COMPLETE 3+ VIEW  COMPARISON:  None.  FINDINGS: Subtle nondisplaced fracture of the distal shaft of the left fifth metacarpal cannot be excluded. This is identified only on one view. Lucency may just represent a soft tissue fold. No other  acute bony abnormalities identified. Diffuse degenerative change.  IMPRESSION: 1. Cannot exclude subtle nondisplaced fracture of the distal shaft of the left fifth metacarpal. This is seen only on one view . Lucency identified may just represent a soft tissue fold. 2. Diffuse degenerative change.   Electronically Signed   By: [name]   On: 01/28/2014 21:56   Dg Hand Complete Right  01/28/2014   CLINICAL DATA:  Bilateral hand pain. No known injury. Initial evaluation.  EXAM: RIGHT HAND - COMPLETE 3+ VIEW  COMPARISON:  None.  FINDINGS: No acute soft tissue bony abnormality identified. Diffuse mild degenerative change.  IMPRESSION: No acute bony abnormality.  Diffuse mild degenerative change.   Electronically Signed   By: Morgan Moores  [name]   On: 01/28/2014 21:59    Admission HPI: 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 It has been worsening over the past week requiring her to use her albuterol. She reports compliance of her medications at home. She has cough - unclear if productive. Denies fevers, chills, nausea/vomiting, abdominal pain, trouble urinating.  Rest of HPI per ED as pt is somnolent and undergoing BiPAP tx. No exacerbating or relieving factors. Only intermittently using inhaler and nebulizer. No hemoptysis, chest pain, or syncope.  She was last seen in clinic on 01/22/2014. Her weight was up 7 lbs to 261 from her last visit on 12/22/2013. Her lasix was increased to 10m BID. Her chronic respiratory failure and COPD were noted to be stable.   She was hospitalized from 8/24-8/26 for acute on chronic respiratory failure thought to be due to COPD exacerbation. Her ABG on admission was pH 7.29, CO2 95, O2 60, bicarb 46. She was treated with BiPAP and transitioned to [place]. She received prednisone 59and levaquin 592maily x 5  days.  Hospital Course by problem list: Principal Problem:   Acute on chronic respiratory failure with hypercapnia Active Problems:   TOBACCO ABUSE   COPD, severe   Hyperlipidemia   Chronic diastolic CHF (congestive heart failure)   Acute on chronic respiratory failure with hypercapnia: 2/2 severe COPD. She was treated with BiPAP, scheduled Duonebs, and solumedrol IV. BiPAP was weaned down to her home O2 with O2 sats 88-92%. She received levaquin IV x 6 days. Once she was tolerating PO intake, she was transitioned back to her home COPD meds and a prednisone taper (61m30m2, 20mg86m, 10mg 93mdays and 5mg x 51mays).   Delirium: Likely 2/2 steroid effect in the setting of respiratory distress. She was intermittently disoriented per family members and RN and  at bedside on morning rounds. Her steroid dosing was adjusted to allow her to receive them in the AM to better allow for her to maintain day/night cycle. As her respiratory status improved, her disorientation cleared as well. She was oriented to person, place, and time on the day of discharge.   Myoclonus: 2/2 hypercapnia. She complained of muscle jerking on admission which had worsened over the past weeks. Improved with BiPAP and therapy.  Tobacco abuse: She reported smoking 2 packs/day and was counseled on cutting back to better help her reduce her level of CO2.   HLD: Stable on home statin once she was tolerating PO intake.  CHF: Home Lasix was held on admission given that she appeared euvolemic. Daily weights were followed though too variable for assessment. She was asked to resume her home dose at the time of discharge with follow-up in clinic.   Discharge Vitals:   BP 134/74 mmHg  Pulse 75  Temp(Src) 98 F (36.7 C) (Oral)  Resp 24  Ht 5' 5" (1.651 m)  Wt 250 lb 3.6 oz (113.5 kg)  BMI 41.64 kg/m2  SpO2 93%  Discharge Labs:  No results found for this or any previous visit (from the past 24 hour(s)).  Signed: Charlott Rakes,  MD 02/11/2014, 4:54 PM    Services Ordered on Discharge: [place] Ordered on Discharge: None

## 2014-02-01 NOTE — Progress Notes (Signed)
CRITICAL VALUE ALERT  Critical value received: CO2.>45  Date of notification: Feb 01 2014  Time of notification:  0531  Critical value read back:Yes.    Nurse who received alert:  Christain [place]  MD notified (1st page):  Dr Randell Patient  Time of first page:  [phone]   Responding MD:  Dr. Randell Patient  Time MD responded:  [phone]

## 2014-02-01 NOTE — Progress Notes (Signed)
Subjective: Overnight, she continued to be off BiPAP and is now on 5-6L O2 with O2 sats 88-92%. Per RN, she is mentating well and has been making good urine. She reports using 4-5L O2 at home as well.   Objective: Vital signs in last 24 hours: Filed Vitals:   02/01/14 0841 02/01/14 0855 02/01/14 0900 02/01/14 1142  BP:  125/68 125/68 120/67  Pulse:  68 80 79  Temp:  98.6 F (37 C)  99 F (37.2 C)  TempSrc:  Oral  Oral  Resp:  _0 Height:      Weight:      SpO2: 88% 90% 97% 91%   Weight change: 4 lb 10.1 oz (2.1 kg)  Intake/Output Summary (Last 24 hours) at 02/01/14 1229 Last data filed at 02/01/14 1000  Gross per 24 hour  Intake    640 ml  Output   1650 ml  Net  -1010 ml   General: resting in bed, 5.5L O2 on nasal cannula HEENT: PERRL, EOMI, no scleral icterus Cardiac: RRR, no rubs, murmurs or gallops Pulm: fine bibasilar crackles Abd: soft, nontender, nondistended, BS present Ext: warm and well perfused, 1+ trace pitting edema otherwise stable from yesterday Neuro: alert and oriented x 3, cranial nerves II-XII grossly intact, strength and sensation to light touch equal in bilateral upper and lower extremities  Lab Results: Basic Metabolic Panel:  Recent Labs Lab 01/27/14 0407  01/31/14 0232 02/01/14 0335  NA 146  < > 146 143  K 4.2  < > 3.8 4.0  CL 92*  < > 97 93*  CO2 45*  < > >45* >45*  GLUCOSE 175*  < > 109* 126*  BUN 9  < > 27* 21  CREATININE 0.80  < > 0.81 0.83  CALCIUM 9.4  < > 9.1 9.0  MG 1.9  --   --   --   PHOS 4.8*  --   --   --   < > = values in this interval not displayed.  CBC:  Recent Labs Lab 01/26/14 2138 01/27/14 0407 01/31/14 0232  WBC 6.9 6.6 8.4  NEUTROABS 4.9  --   --   HGB 13.3 13.6 14.2  HCT 45.0 46.4* 46.1*  MCV 100.9* 102.0* 98.5  PLT 147* 123* 102*   Cardiac Enzymes:  Recent Labs Lab 01/27/14 0407 01/27/14 1003 01/27/14 1715  TROPONINI <0.30 <0.30 <0.30   Thyroid Function Tests:  Recent Labs Lab  01/27/14 0407  TSH 2.060   Medications: I have reviewed the patient's current medications. Scheduled Meds: . heparin  5,000 Units Subcutaneous 3 times per day  . ipratropium-albuterol  3 mL Nebulization TID  . mometasone-formoterol  2 puff Inhalation BID  . pneumococcal 23 valent vaccine  0.5 mL Intramuscular Tomorrow-1000  . sodium chloride  3 mL Intravenous Q12H  . sodium chloride  3 mL Intravenous Q12H   Continuous Infusions:  PRN Meds:.sodium chloride, ondansetron (ZOFRAN) IV, ondansetron, polyvinyl alcohol, sodium chloride Assessment/Plan: Principal Problem:   Acute on chronic respiratory failure with hypercapnia Active Problems:   TOBACCO ABUSE   COPD, severe   Hyperlipidemia   Chronic diastolic CHF (congestive heart failure)  [name] is 58 year old with COPD, CHF, pulmonary HTN, CAD, HLD, tobacco abuse who presents today with acute-on-chronic hypoxic hypercarbic respiratory failure who is hospitalized for acute-on-chronic hypercarbic hypercapnic respiratory failure with resolved delirium.  Resolved delirium: Likely related to dysregulation of day/night cycle and perhaps irregular steroid dosing.   Acute-on-chronic hypercarbic  hypercapnic respiratory failure: Likely 2/2 COPD exacerbation from non-adherence to treatment.  -Continue to wean O2 as tolerated to home 4L -Taper prednisone 72m->10mg tomorrow -Continue Duonebs, Dulera  Hand pain/twitches: XR unremarkable for acute findings. Likely related to hypercapnia.  -Continue assessing   CHF: Weight 251lbs, up from 246lbs yesterday; BUN/Crt ratio does not suggest she is hypervolemic.  -Continue daily weights, strict I&Os -Defer Lasix for now  HLD: Restart home statin.  #FEN:  -Diet: Heart Healthy  #DVT prophylaxis: heparin 5000 units subcutaneous  #CODE STATUS: DNR/DNI  Dispo: Disposition is deferred at this time, awaiting improvement of current medical problems.  Anticipated discharge in approximately 1-2  day(s).   The patient does have a current PCP (Morgan Avers MD) and [facility].  The patient does have transportation limitations that hinder transportation to clinic appointments.  .Services Needed at time of discharge: Y = Yes, Blank = No PT:   OT:   RN:   Equipment:   Other:     LOS: 6 days   RCharlott Rakes MD 02/01/2014, 12:29 PM

## 2014-02-02 ENCOUNTER — Other Ambulatory Visit: Payer: Self-pay | Admitting: Internal Medicine

## 2014-02-02 LAB — BASIC METABOLIC PANEL
Anion gap: 3 — ABNORMAL LOW (ref 5–15)
BUN: 16 mg/dL (ref 6–23)
CALCIUM: 8.7 mg/dL (ref 8.4–10.5)
CHLORIDE: 98 meq/L (ref 96–112)
CO2: 42 mEq/L (ref 19–32)
CREATININE: 0.65 mg/dL (ref 0.50–1.10)
GFR calc non Af Amer: >90 mL/min (ref >90)
Glucose, Bld: 133 mg/dL — ABNORMAL HIGH (ref 70–99)
Potassium: 3.9 mEq/L (ref 3.7–5.3)
Sodium: 143 mEq/L (ref 137–147)

## 2014-02-02 MED ORDER — PREDNISONE 5 MG PO TABS: 10.0000 mg | ORAL_TABLET | Freq: Every day | ORAL | Status: DC

## 2014-02-02 NOTE — Progress Notes (Signed)
Subjective: No complaints today. No use of BIPAP in 3 days. Saturating- 93% on %L of O2. No SOB and feels back to baseline. Ready to go home today.  Objective: Vital signs in last 24 hours: Filed Vitals:   02/01/14 2305 02/02/14 0155 02/02/14 0349 02/02/14 0736  BP: 130/90  120/71 134/74  Pulse: 71  68 75  Temp: 97.6 F (36.4 C)  98.4 F (36.9 C) 98 F (36.7 C)  TempSrc: Oral  Oral Oral  Resp: _0 Height:      Weight:   250 lb 3.6 oz (113.5 kg)   SpO2: 92% 84% 94% 93%   Weight change: -1 lb 1.6 oz (-0.5 kg)  Intake/Output Summary (Last 24 hours) at 02/02/14 0930 Last data filed at 02/02/14 0730  Gross per 24 hour  Intake   1000 ml  Output   2200 ml  Net  -1200 ml   General: resting in bed, 5L O2 on nasal cannula, Alert and oriented HEENT: PERRL, EOMI, no scleral icterus Cardiac: RRR, no rubs, murmurs or gallops Pulm: No crackles on exam Abd: soft, nontender, distended but normal for pt, BS present Ext: warm and well perfused, 1+ trace pitting edema  Neuro: alert and oriented x 3, cranial nerves II-XII grossly intact, strength and sensation to light touch equal in bilateral upper and lower extremities  Lab Results: Basic Metabolic Panel:  Recent Labs Lab 01/27/14 0407  02/01/14 0335 02/02/14 0353  NA 146  < > 143 143  K 4.2  < > 4.0 3.9  CL 92*  < > 93* 98  CO2 45*  < > >45* 42*  GLUCOSE 175*  < > 126* 133*  BUN 9  < > 21 16  CREATININE 0.80  < > 0.83 0.65  CALCIUM 9.4  < > 9.0 8.7  MG 1.9  --   --   --   PHOS 4.8*  --   --   --   < > = values in this interval not displayed.  CBC:  Recent Labs Lab 01/26/14 2138 01/27/14 0407 01/31/14 0232  WBC 6.9 6.6 8.4  NEUTROABS 4.9  --   --   HGB 13.3 13.6 14.2  HCT 45.0 46.4* 46.1*  MCV 100.9* 102.0* 98.5  PLT 147* 123* 102*   Cardiac Enzymes:  Recent Labs Lab 01/27/14 0407 01/27/14 1003 01/27/14 1715  TROPONINI <0.30 <0.30 <0.30   Thyroid Function Tests:  Recent Labs Lab 01/27/14 0407   TSH 2.060   Medications: I have reviewed the patient's current medications. Scheduled Meds: . heparin  5,000 Units Subcutaneous 3 times per day  . ipratropium-albuterol  3 mL Nebulization TID  . mometasone-formoterol  2 puff Inhalation BID  . pravastatin  20 mg Oral q1800  . predniSONE  10 mg Oral Once   Continuous Infusions:  PRN Meds:.sodium chloride, ondansetron **OR** ondansetron (ZOFRAN) IV, polyvinyl alcohol, sodium chloride Assessment/Plan: Principal Problem:   Acute on chronic respiratory failure with hypercapnia Active Problems:   TOBACCO ABUSE   COPD, severe   Hyperlipidemia   Chronic diastolic CHF (congestive heart failure)  Ms. Grimes is 58 year old with COPD, CHF, pulmonary HTN, CAD, HLD, tobacco abuse who presents today with acute-on-chronic hypoxic hypercarbic respiratory failure who is hospitalized for acute-on-chronic hypercarbic hypercapnic respiratory failure with resolved delirium.  Resolved delirium: Resolved. Likely related to dysregulation of day/night cycle and perhaps irregular steroid dosing.   Acute-on-chronic hypercarbic hypercapnic respiratory failure: Likely 2/2 COPD exacerbation from non-adherence to  treatment.  - Discharge home today, follow up with [place] in 1 week. -Continue to wean O2 as tolerated to home 5L -Cont prednisone taper 33m->10mg for 2 days, then 546mfor 2 days -Continue Duonebs, Dulera, discharge on home inhalers and nebs.  Hand pain/twitches: Resolved. XR unremarkable for acute findings. Likely related to hypercapnia.   CHF: Net negative- 7L since admission. Weight stable. -Defer Lasix for now  HLD: Restart home statin.  #FEN:  -Diet: Heart Healthy  #DVT prophylaxis: heparin 5000 units subcutaneous  #CODE STATUS: DNR/DNI  Dispo: Disposition is deferred at this time, awaiting improvement of current medical problems.  Anticipated discharge in approximately 1-2 day(s).   The patient does have a current PCP (RJessee AversMD)  and [facility].  The patient does have transportation limitations that hinder transportation to clinic appointments.  .Services Needed at time of discharge: Y = Yes, Blank = No PT:   OT:   RN:   Equipment:   Other:     LOS: 7 days   EjBethena RoysMD 02/02/2014, 9:30 AM

## 2014-02-02 NOTE — Discharge Instructions (Signed)
Thank you for trusting [place] with your medical care!  You were hospitalized for acute on chronic respiratory failure. You were treated with oxygen and steroids.   Please continue taking prednisone: 31m tomorrow, then 5 mg for 2 more days. That is a total of three days.  It is important you stop smoking to allow your body to breathe off carbon dioxide.   Chronic Respiratory Failure Respiratory failure is when your lungs are not working well and your breathing (respiratory) system fails. When respiratory failure occurs, it is difficult for your lungs to get enough oxygen or get rid of carbon dioxide or both. Respiratory failure can be life threatening.  Respiratory failure can be acute or chronic. Acute respiratory failure is sudden, severe, and requires emergency medical treatment. Chronic respiratory failure is less severe, happens over time, and requires ongoing treatment.  CAUSES  Any problem affecting the heart or lungs can cause respiratory failure. Some of these causes may be:  Chronic bronchitis and emphysema (COPD).  Blood clot going to the lung (pulmonary embolism).  Having water in the lungs caused by heart failure, lung injury, or infection (pulmonary edema).  Collapsed lung (pneumothorax).  Pneumonia.  Pulmonary fibrosis.  Obesity.  Asthma.  Heart failure.  Any type of trauma to the chest that can make breathing difficult.  Nerve or muscle diseases making chest movements difficult. SYMPTOMS  Signs and symptoms of chronic respiratory failure include:  Shortness of breath (dyspnea) with or without activity.  Rapid, fast breathing (tachypnea).  Wheezing.  Fast heart rate.  Bluish color to the fingernail or toenail beds.  Confusion or drowsiness or both. DIAGNOSIS  Initial diagnosis requires a thorough history and a physical exam by your health care provider. Additional tests may include:  Chest X-ray.  CT scan of your lungs.  Ultrasound to check for  blood clots.  Blood tests, such as an arterial blood gas test (ABG). This is a blood test that looks at the oxygen and carbon dioxide levels in your arterial blood.  Your vital signs will be taken. This includes your respiratory rate (how many times a minute you are breathing), oxygen saturation (this measures the oxygen level in your blood), heart rate, and blood pressure. These numbers help your health care provider determine the next steps.  Electrocardiogram. TREATMENT  Treatment of chronic respiratory failure depends on the cause of the respiratory failure. Treatment can include the following:  Oxygen. Oxygen can be delivered through the following:  Nasal cannula. This is small tubing that goes in your nose to give you oxygen.  Face mask. A face mask covers your nose and mouth to give you oxygen.  Medicine. Different medicines can be given to help with breathing. These can include:  Nebulizers. Nebulizers deliver medicines to open the air passages (bronchodilators). These medicines help to open or relax the airways in the lungs so you can breathe better. They can also help loosen mucus from your lungs.  Diuretics. Diuretic medicines can help you breathe better by getting rid of extra fluid in your body.  Steroids. Steroid medicines can help decrease inflammation in your lungs.  Chest tube. If you have a collapsed lung (pneumothorax), a chest tube is placed to help reinflate the lung.  Non-invasive positive pressure ventilation (NPPV). This is a tight-fitting mask that goes over your nose and mouth. The mask has tubing that is attached to a machine. The machine blows air into the tubing, which helps to keep the tiny air sacs (alveoli) in your  lungs open. This machine allows you to breathe on your own.  Ventilator. A ventilator is a breathing machine. When on a ventilator, a breathing tube is put into the lungs. A ventilator is used when you can no longer breathe well enough on your  own. You may have low oxygen levels or high carbon dioxide (CO2) levels in your blood. When you are on a ventilator, sedation and pain medicines are given to make you sleep so your lungs can heal. HOME CARE INSTRUCTIONS  Follow your health care provider's directions about medicines and respiratory therapy.  Quit smoking if you smoke. SEEK MEDICAL CARE IF:  You have increasing shortness of breath and are less functional than you have been.  You have increased sputum, wheezing, coughing, or loss of energy.  You are on oxygen and are requiring more. SEEK IMMEDIATE MEDICAL CARE IF:  Your shortness of breath is significantly worse.  You are unable to say more than a few words without having to catch your breath.  You are much less functional. MAKE SURE YOU:  Understand these instructions.  Will watch your condition.  Will get help right away if you are not doing well or get worse.

## 2014-02-02 NOTE — Progress Notes (Signed)
Discharge instructions given to patient with teach-back, all questions answered.  No complaints. Discharged home.

## 2014-02-03 NOTE — Progress Notes (Signed)
Rx called in to pharmacy. 

## 2014-02-03 NOTE — Progress Notes (Signed)
CARE MANAGEMENT NOTE 02/03/2014  Patient:  [name], [name]   Account Number:  [id number]  Date Initiated:  01/28/2014  Documentation initiated by:  Marvetta Gibbons  Subjective/Objective Assessment:   Pt admitted with resp failure/COPD     Action/Plan:   PTA pt lived at home- has home 02-uses 4L- NCM to follow for d/c needs   Anticipated DC Date:  02/03/2014   Anticipated DC Plan:  [place]  CM consult      Choice offered to / List presented to:             Status of service:  Completed, signed off Medicare Important Message given?  YES (If response is "NO", the following Medicare IM given date fields will be blank) Date Medicare IM given:  01/31/2014 Medicare IM given by:  Marvetta Gibbons Date Additional Medicare IM given:   Additional Medicare IM given by:    Discharge Disposition:  HOME/SELF CARE  Per UR Regulation:  Reviewed for med. necessity/level of care/duration of stay  If discussed at [place] of Stay Meetings, dates discussed:    Comments:  02/03/14- 1200- Marvetta Gibbons RN, BSN [phone] pt discharged over weekend, noted referral to [facility]- spoke with Domenick Bookbinder. with [facility] regarding referral- pt was recently discharged from [facility]- per conversation with Domenick Bookbinder. they will re-look at pt for appropriateness of services with [facility].  01/31/14- 90- Marvetta Gibbons RN, BSN [phone] Per conversation with pt- she has home 02- with [employer]- she also has a home nebulizer and RW. she has used [facility] in past for [facility]- NCM to follow for any d/c needs

## 2014-02-10 ENCOUNTER — Ambulatory Visit (INDEPENDENT_AMBULATORY_CARE_PROVIDER_SITE_OTHER): Payer: Medicare HMO | Admitting: Internal Medicine

## 2014-02-10 ENCOUNTER — Encounter: Payer: Self-pay | Admitting: Internal Medicine

## 2014-02-10 VITALS — BP 113/59 | HR 86 | Temp 98.2°F | Ht 65.0 in | Wt 256.1 lb

## 2014-02-10 DIAGNOSIS — I5032 Chronic diastolic (congestive) heart failure: Secondary | ICD-10-CM

## 2014-02-10 DIAGNOSIS — Z0000 Encounter for general adult medical examination without abnormal findings: Secondary | ICD-10-CM

## 2014-02-10 DIAGNOSIS — H538 Other visual disturbances: Secondary | ICD-10-CM

## 2014-02-10 DIAGNOSIS — R7303 Prediabetes: Secondary | ICD-10-CM

## 2014-02-10 DIAGNOSIS — R7309 Other abnormal glucose: Secondary | ICD-10-CM

## 2014-02-10 DIAGNOSIS — J449 Chronic obstructive pulmonary disease, unspecified: Secondary | ICD-10-CM

## 2014-02-10 LAB — CBC WITH DIFFERENTIAL/PLATELET
Basophils Absolute: 0 10*3/uL (ref 0.0–0.1)
Basophils Relative: 0 % (ref 0–1)
EOS ABS: 0.2 10*3/uL (ref 0.0–0.7)
EOS PCT: 2 % (ref 0–5)
HCT: 44.9 % (ref 36.0–46.0)
Hemoglobin: 14.3 g/dL (ref 12.0–15.0)
Lymphocytes Relative: 14 % (ref 12–46)
Lymphs Abs: 1.4 10*3/uL (ref 0.7–4.0)
MCH: 30 pg (ref 26.0–34.0)
MCHC: 31.8 g/dL (ref 30.0–36.0)
MCV: 94.1 fL (ref 78.0–100.0)
Monocytes Absolute: 0.8 10*3/uL (ref 0.1–1.0)
Monocytes Relative: 8 % (ref 3–12)
Neutro Abs: 7.5 10*3/uL (ref 1.7–7.7)
Neutrophils Relative %: 76 % (ref 43–77)
Platelets: 196 10*3/uL (ref 150–400)
RBC: 4.77 MIL/uL (ref 3.87–5.11)
RDW: 15.2 % (ref 11.5–15.5)
WBC: 9.9 10*3/uL (ref 4.0–10.5)

## 2014-02-10 LAB — BASIC METABOLIC PANEL WITH GFR
BUN: 8 mg/dL (ref 6–23)
CALCIUM: 9.1 mg/dL (ref 8.4–10.5)
CO2: 39 meq/L — AB (ref 19–32)
Chloride: 97 mEq/L (ref 96–112)
Creat: 0.72 mg/dL (ref 0.50–1.10)
GFR, Est African American: >89 mL/min
GFR, Est Non African American: >89 mL/min
GLUCOSE: 132 mg/dL — AB (ref 70–99)
Potassium: 4.4 mEq/L (ref 3.5–5.3)
SODIUM: 144 meq/L (ref 135–145)

## 2014-02-10 LAB — POCT GLYCOSYLATED HEMOGLOBIN (HGB A1C): Hemoglobin A1C: 5.6

## 2014-02-10 NOTE — Patient Instructions (Addendum)
General Instructions:  Please bring your medicines with you each time you come to clinic.  Medicines may include prescription medications, over-the-counter medications, herbal remedies, eye drops, vitamins, or other pills.  Please take your lasix daily as prescribed and weigh yourself daily. Weigh yourself daily, if your weight is up or down 3 pounds call us right away [phone]. If you start having worsening sob, chest pain, swelling, call us as well or if severe, go back to ER.   Please try to stop smoking. You can call 1800quitnow for assistance  Follow up with eye doctor when scheduled for appointment  Worsening of your headaches, let us know or go to emergency room. Try cutting back on the bc powders  Follow up with pcp next available  Progress Toward Treatment Goals:  Treatment Goal 02/10/2014  Stop smoking smoking the same amount  Prevent falls -    Self Care Goals & Plans:  Self Care Goal 01/22/2014  Manage my medications take my medicines as prescribed; bring my medications to every visit; refill my medications on time  Monitor my health -  Eat healthy foods drink diet soda or water instead of juice or soda; eat more vegetables; eat foods that are low in salt; eat baked foods instead of fried foods; eat fruit for snacks and desserts  Be physically active -  Stop smoking go to the [employer] (https://scott-booker.info/)    No flowsheet data found.   Care Management & Community Referrals:  Referral 04/24/2013  Referrals made for care management support [facility] case management program  Referrals made to community resources transportation

## 2014-02-10 NOTE — Assessment & Plan Note (Signed)
Recheck a1c today--5.6  Will continue to monitor, hopefully she can reduce weight as well

## 2014-02-10 NOTE — Progress Notes (Signed)
Subjective:   Patient ID: [name] female   DOB: 10-27-1955 58 y.o.   MRN: [id number]  HPI: [name] is a 58 y.o. female with COPD stage 4 on 4L NCO2 and diastolic CHF with elevated PA pressure presenting to opc today for hospital follow up visit. Recent admission for COPD exacerbation.   COPD stage 4--completed prednisone taper. Feeling back to baseline in regards to breathing. Taking advair and spiriva. Has not needed albuterol inhaler or nebulizer treatment since discharge. Still smoking up to 4-5 cigarettes per day. No intention to quit at this time and related to stress.   dCHF--weight up to 256 today (was 250.22lb's on 11/1). Non-compliant with lasix. Last dose was on Saturday of 49m daily. Reports improvement in edema and frequent urination when she does take her lasix. Will take today. Stressed importance of daily weights and medication compliance.   Headaches--frequent, constant, between eyes, occasional blurry vision. Chronic. Improved with bc powders. Possibly due to constant oxygen. No pain with eye movements or pressure behind eyes per patient. Last CT head 06/2013 for dizziness and near fall: no acute intracranial abnormality, minimal chronic microvascular ischemic change, no other abnormalities.  No recent fall lately but has fallen in the past. Does feel lightheaded at times and needs to take her time standing up. Orthostatic vital signs negative today.   Past Medical History  Diagnosis Date  . Coronary artery disease     S/P PCI of LAD with DES (12/2008). Total occlusion of RCA noted at that time., medically managed. ACS ruled out 03/2009 with Lexiscan myoview . Followed by [facility].  . Pulmonary hypertension     2-D Echo (040/9811 - Systolic pressure was moderately increased. PA peak pressure  578mg. 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 Followed by  Dr. WrJoya GaskinsLVelora Heckler . Diastolic dysfunction     2-D Echo (12/2008) - Normal LV Systolic funciton with EF 60-65%. Grade 1 diastolid dysfunction. No regional wall motion abnormalities. Moderate pulmonary HTN with PA peak pressure 593m.  . CMarland KitchenPD (chronic obstructive pulmonary disease)     Severe. Gold Stage IV.  PFTs (12/2008) - severe obstructive airway disease. Active tobacco use. Requires 4L O2 at home.  . Pulmonary nodule, right     Small right middle lobe nodule. Stable as of 12/2008.  . SMarland Kitcheneep apnea     Presumptive. No documented sleep studies.   . Prediabetes 12/2008    HgbA1c 6.4 (12/2008)  . Hx MRSA infection     Recurrent MRSA thigh abscesses.  . Tobacco abuse     Ongoing.  . Obesity   . Hyperlipidemia   . GERD (gastroesophageal reflux disease)     S/P Nissen fundoplication.  . Shortness of breath   . CHF (congestive heart failure)    Current Outpatient Prescriptions  Medication Sig Dispense Refill  . Aspirin-Salicylamide-Caffeine (BC HEADACHE POWDER PO) Take 1 packet by mouth 5 (five) times daily as needed (pain/headache).    . Fluticasone-Salmeterol (ADVAIR) 500-50 MCG/DOSE AEPB Inhale 2 puffs into the lungs 2 (two) times daily.     . furosemide (LASIX) 20 MG tablet Take 60 mg by mouth daily.    . lMarland Kitchenvastatin (MEVACOR) 20 MG tablet Take 1 tablet (20 mg total) by mouth at bedtime. 30 tablet 11  . tiotropium (SPIRIVA HANDIHALER) 18 MCG inhalation capsule Place 1 capsule (18 mcg total) into inhaler and inhale daily. 30Galva  capsule 12  . albuterol (PROVENTIL HFA;VENTOLIN HFA) 108 (90 BASE) MCG/ACT inhaler Inhale 2 puffs into the lungs 3 (three) times daily as needed for wheezing or shortness of breath.    Marland Kitchen albuterol (PROVENTIL) (2.5 MG/3ML) 0.083% nebulizer solution Take 2.5 mg by nebulization daily as needed for wheezing or shortness of breath.    . traMADol (ULTRAM) 50 MG tablet   0   No current facility-administered medications for this visit.   Family History  Problem  Relation Age of Onset  . Heart disease Mother 17    Deceased from MI at 63yo  . Hypertension Mother   . Heart disease Father 51    Deceased of MI age 24yo  . Hypertension Father   . Hypertension Brother   . Lung cancer      Grandmother   History   Social History  . Marital Status: Single    Spouse Name: N/A    Number of Children: N/A  . Years of Education: N/A   Social History Main Topics  . Smoking status: Current Every Day Smoker -- 0.50 packs/day for 40 years    Types: Cigarettes  . Smokeless tobacco: Never Used     Comment: 2 per day  . Alcohol Use: No  . Drug Use: No  . Sexual Activity: None   Other Topics Concern  . None   Social History Narrative   Formerly worked as a Scientist, water quality, now disabled.   Divorced.   2 grown children.   Lives with her grandson.   Review of Systems:  Constitutional:  Denies fever, chills  HEENT:  Denies congestion  Respiratory:  On chronic oxygen  Cardiovascular:  Denies chest pain  Gastrointestinal:  Constipation. Denies nausea, vomiting, abdominal pain.   Genitourinary:  Denies dysuria  Musculoskeletal:  Denies gait problem.   Skin:  Dry  Neurological:  Headaches occasional dizziness   Objective:  Physical Exam: Filed Vitals:   02/10/14 0916  BP: 113/59  Pulse: 86  Temp: 98.2 F (36.8 C)  TempSrc: Oral  Height: _0  (1.651 m)  Weight: 256 lb 1.6 oz (116.166 kg)  SpO2: 90%   Vitals reviewed. General: sitting in chair, NAD, on 4L [place] O2 HEENT: PERRL slightly slow to react, EOMI, no scleral icterus No obvious hemorrhage or dilated vessels on fundoscopy Cardiac: RRR Pulm: clear to auscultation bilaterally but decreased air movement, no wheezing Abd: soft, obese, BS present Ext: warm and well perfused, +1 pitting edema b/l Neuro: alert and oriented X3, strength and sensation to light touch equal in bilateral upper and lower extremities, gait stable without support, slight unsteadiness when eyes closed, no slurred speech,  finger to nose intact, able to puff cheeks, no tongue deviation noted, shrugging shoulders and closing eyes tightly  Assessment & Plan:  Discussed with Dr. Ellwood Dense

## 2014-02-10 NOTE — Progress Notes (Signed)
Patient ID: [name], female   DOB: [birth year], 58 y.o.   MRN: [id number] [facility] Attending  Case discussed with Dr. Eula Fried at the time of the visit.  We reviewed the resident's history and exam and pertinent patient test results.  I agree with the assessment, diagnosis, and plan of care documented in the resident's note.

## 2014-02-10 NOTE — Assessment & Plan Note (Addendum)
Associated with headache. No pain in eyes and pressure or pain with movement. Headache, chronic, CT head with no obvious mass 06/2013, relieved with bc powder. Occasionally blurry.   optho referral today, has never had eye exam

## 2014-02-10 NOTE — Assessment & Plan Note (Signed)
Does not want colonoscopy referral (reports constipation and trouble with anesthesia) or papsmear today. Encourage discussion with pcp on follow up.   Consider stool card if she can give sample Recommended miralax for constipation but she does not want to take anything for it

## 2014-02-10 NOTE — Assessment & Plan Note (Addendum)
Weight increasing. Up ~5 pounds since hospital discharge. Non-compliant with lasix.   Last echo 11/2012: Study Conclusions  - Left ventricle: Systolic function was normal. The estimated ejection fraction was in the range of 55% to 60%. Regional wall motion abnormalities cannot be excluded. There was an increased relative contribution of atrial contraction to ventricular filling. - Ventricular septum: Septal motion showed paradox. - Left atrium: The atrium was mildly dilated. - Right atrium: The atrium was mildly dilated. - Pulmonary arteries: Systolic pressure was moderately increased. PA peak pressure: 69m Hg (S). Transthoracic echocardiography. M-mode, complete 2D, spectral Doppler, and color Doppler. Height: Height: 165.1cm. Height: 65in. Weight: Weight: 110.2kg. Weight: 242.5lb. Body mass index: BMI: 40.4kg/m^2. Body surface area:  BSA: 2.148m. Patient status: Inpatient.  -recommended restart lasix today--6050maily as prescribed (although last pcp assessment last month says 37m76md, dose may have been increased on hospital discharge but d/c summary not complete yet) -daily weights, informed to let us k[place]oreaw if 3 pound changes or worsening of symptoms including sob or cp -bmet and cbc today -follow up 2 weeks for weight and with pcp

## 2014-02-10 NOTE — Assessment & Plan Note (Signed)
Back to baseline breathing s/p recent hospital admission for exacerbation per patient. Completed prednisone taper. On 4L [place] O2. Still smoking.   -advised to stop smoking if possible

## 2014-02-11 DIAGNOSIS — J9620 Acute and chronic respiratory failure, unspecified whether with hypoxia or hypercapnia: Secondary | ICD-10-CM | POA: Diagnosis present

## 2014-02-20 NOTE — Addendum Note (Signed)
Addended by: Marcelino Duster on: 02/20/2014 10:31 AM   Modules accepted: Orders

## 2014-02-22 ENCOUNTER — Emergency Department (HOSPITAL_COMMUNITY): Payer: Medicare HMO

## 2014-02-22 ENCOUNTER — Inpatient Hospital Stay (HOSPITAL_COMMUNITY)
Admission: EM | Admit: 2014-02-22 | Discharge: 2014-02-24 | DRG: 291 | Disposition: A | Discharge status: Home / Self Care | Payer: Medicare HMO | Attending: Internal Medicine | Admitting: Internal Medicine

## 2014-02-22 ENCOUNTER — Encounter (HOSPITAL_COMMUNITY): Payer: Self-pay | Admitting: Emergency Medicine

## 2014-02-22 DIAGNOSIS — I272 Other secondary pulmonary hypertension: Secondary | ICD-10-CM | POA: Diagnosis present

## 2014-02-22 DIAGNOSIS — J40 Bronchitis, not specified as acute or chronic: Secondary | ICD-10-CM

## 2014-02-22 DIAGNOSIS — I2781 Cor pulmonale (chronic): Secondary | ICD-10-CM | POA: Diagnosis present

## 2014-02-22 DIAGNOSIS — I2510 Atherosclerotic heart disease of native coronary artery without angina pectoris: Secondary | ICD-10-CM | POA: Diagnosis present

## 2014-02-22 DIAGNOSIS — Z9861 Coronary angioplasty status: Secondary | ICD-10-CM

## 2014-02-22 DIAGNOSIS — Z9119 Patient's noncompliance with other medical treatment and regimen: Secondary | ICD-10-CM | POA: Diagnosis present

## 2014-02-22 DIAGNOSIS — Z888 Allergy status to other drugs, medicaments and biological substances status: Secondary | ICD-10-CM

## 2014-02-22 DIAGNOSIS — J441 Chronic obstructive pulmonary disease with (acute) exacerbation: Secondary | ICD-10-CM | POA: Diagnosis present

## 2014-02-22 DIAGNOSIS — R0602 Shortness of breath: Secondary | ICD-10-CM | POA: Diagnosis not present

## 2014-02-22 DIAGNOSIS — G4730 Sleep apnea, unspecified: Secondary | ICD-10-CM | POA: Diagnosis present

## 2014-02-22 DIAGNOSIS — F17210 Nicotine dependence, cigarettes, uncomplicated: Secondary | ICD-10-CM | POA: Diagnosis present

## 2014-02-22 DIAGNOSIS — E785 Hyperlipidemia, unspecified: Secondary | ICD-10-CM | POA: Diagnosis present

## 2014-02-22 DIAGNOSIS — J9622 Acute and chronic respiratory failure with hypercapnia: Secondary | ICD-10-CM | POA: Diagnosis present

## 2014-02-22 DIAGNOSIS — I5033 Acute on chronic diastolic (congestive) heart failure: Secondary | ICD-10-CM | POA: Diagnosis not present

## 2014-02-22 DIAGNOSIS — J9620 Acute and chronic respiratory failure, unspecified whether with hypoxia or hypercapnia: Secondary | ICD-10-CM | POA: Diagnosis present

## 2014-02-22 DIAGNOSIS — Z9981 Dependence on supplemental oxygen: Secondary | ICD-10-CM

## 2014-02-22 DIAGNOSIS — K219 Gastro-esophageal reflux disease without esophagitis: Secondary | ICD-10-CM | POA: Diagnosis present

## 2014-02-22 DIAGNOSIS — Z7951 Long term (current) use of inhaled steroids: Secondary | ICD-10-CM

## 2014-02-22 LAB — BASIC METABOLIC PANEL
Anion gap: 6 (ref 5–15)
BUN: 8 mg/dL (ref 6–23)
CHLORIDE: 95 meq/L — AB (ref 96–112)
CO2: 41 mEq/L (ref 19–32)
CREATININE: 0.77 mg/dL (ref 0.50–1.10)
Calcium: 8.9 mg/dL (ref 8.4–10.5)
GFR calc non Af Amer: >90 mL/min (ref >90)
Glucose, Bld: 134 mg/dL — ABNORMAL HIGH (ref 70–99)
Potassium: 3.7 mEq/L (ref 3.7–5.3)
Sodium: 142 mEq/L (ref 137–147)

## 2014-02-22 LAB — CBC WITH DIFFERENTIAL/PLATELET
BASOS PCT: 0 % (ref 0–1)
Basophils Absolute: 0 10*3/uL (ref 0.0–0.1)
Eosinophils Absolute: 0.2 10*3/uL (ref 0.0–0.7)
Eosinophils Relative: 3 % (ref 0–5)
HCT: 42.8 % (ref 36.0–46.0)
Hemoglobin: 12.8 g/dL (ref 12.0–15.0)
Lymphocytes Relative: 25 % (ref 12–46)
Lymphs Abs: 1.5 10*3/uL (ref 0.7–4.0)
MCH: 29.4 pg (ref 26.0–34.0)
MCHC: 29.9 g/dL — AB (ref 30.0–36.0)
MCV: 98.2 fL (ref 78.0–100.0)
MONO ABS: 0.6 10*3/uL (ref 0.1–1.0)
Monocytes Relative: 9 % (ref 3–12)
Neutro Abs: 3.8 10*3/uL (ref 1.7–7.7)
Neutrophils Relative %: 63 % (ref 43–77)
Platelets: 132 10*3/uL — ABNORMAL LOW (ref 150–400)
RBC: 4.36 MIL/uL (ref 3.87–5.11)
RDW: 14 % (ref 11.5–15.5)
WBC: 6.1 10*3/uL (ref 4.0–10.5)

## 2014-02-22 LAB — PRO B NATRIURETIC PEPTIDE: Pro B Natriuretic peptide (BNP): 1172 pg/mL — ABNORMAL HIGH (ref 0–125)

## 2014-02-22 LAB — TROPONIN I
Troponin I: <0.3 ng/mL (ref <0.30)
Troponin I: <0.3 ng/mL (ref <0.30)

## 2014-02-22 MED ORDER — IPRATROPIUM-ALBUTEROL 0.5-2.5 (3) MG/3ML IN SOLN
3.0000 mL | Freq: Once | RESPIRATORY_TRACT | Status: AC
  Administered 2014-02-23: 3 mL via RESPIRATORY_TRACT
  Filled 2014-02-22: qty 3

## 2014-02-22 MED ORDER — AZITHROMYCIN 250 MG PO TABS: ORAL_TABLET | ORAL | Status: DC

## 2014-02-22 MED ORDER — IPRATROPIUM-ALBUTEROL 0.5-2.5 (3) MG/3ML IN SOLN
3.0000 mL | Freq: Once | RESPIRATORY_TRACT | Status: AC
  Administered 2014-02-22: 3 mL via RESPIRATORY_TRACT
  Filled 2014-02-22: qty 3

## 2014-02-22 MED ORDER — PREDNISONE 20 MG PO TABS: 60.0000 mg | ORAL_TABLET | Freq: Every day | ORAL | Status: DC

## 2014-02-22 MED ORDER — FUROSEMIDE 10 MG/ML IJ SOLN
20.0000 mg | Freq: Once | INTRAMUSCULAR | Status: AC
  Administered 2014-02-22: 20 mg via INTRAVENOUS
  Filled 2014-02-22: qty 2

## 2014-02-22 MED ORDER — DEXAMETHASONE SODIUM PHOSPHATE 10 MG/ML IJ SOLN
10.0000 mg | Freq: Once | INTRAMUSCULAR | Status: AC
  Administered 2014-02-22: 10 mg via INTRAVENOUS
  Filled 2014-02-22: qty 1

## 2014-02-22 MED ORDER — IPRATROPIUM-ALBUTEROL 0.5-2.5 (3) MG/3ML IN SOLN: 3.0000 mL | Freq: Once | RESPIRATORY_TRACT | Status: DC

## 2014-02-22 NOTE — ED Notes (Signed)
Pt c/o increased cough and SOB today; pt sts feels like she may be getting bronchitis; pt on home O2

## 2014-02-22 NOTE — ED Provider Notes (Addendum)
CSN: [id number]     Arrival date & time 02/22/14  1819 History   First MD Initiated Contact with Patient 02/22/14 1912     Chief Complaint  Patient presents with  . Cough  . Shortness of Breath     (Consider location/radiation/quality/duration/timing/severity/associated sxs/prior Treatment) HPI Comments: 58 year old female history of obesity, chronic tobacco abuse, CAD, pulmonary hypertension, severe COPD, sleep apnea, diastolic heart failure recent admission for COPD presents with worsening shortness of breath the past 24 hours. She feels this is overall similar to COPD however at times difficult to discern from bronchitis alone versus heart issues. Patient has fairly constant shortness of breath not worse with lying flat, mild exertional. No significant weight change or unilateral leg swelling or leg pain. Patient has mild anterior chest tightness similar to previous, brief, no specific times it's worse. Nonradiating. Patient on 6 L nasal cannula at home, patient has not required increased oxygen recently. No current antibiotics or steroids, recently finished  Patient is a 58 y.o. female presenting with cough and shortness of breath. The history is provided by the patient.  Cough Associated symptoms: shortness of breath   Associated symptoms: no chest pain, no chills, no fever, no headaches and no rash   Shortness of Breath Associated symptoms: cough   Associated symptoms: no abdominal pain, no chest pain, no fever, no headaches, no neck pain, no rash and no vomiting     Past Medical History  Diagnosis Date  . Coronary artery disease     S/P PCI of LAD with DES (12/2008). Total occlusion of RCA noted at that time., medically managed. ACS ruled out 03/2009 with Lexiscan myoview . Followed by [place].  . Pulmonary hypertension     2-D Echo (80/9983) - Systolic pressure was moderately increased. PA peak pressure  42mHg. secondary pulm htn likely on basis of comb of interstital lung  disease, severe copd, small airways disease, severe sleep apnea and cor pulmonale,. Followed by Dr. WJoya Gaskins(Velora Heckler  . Diastolic dysfunction     2-D Echo (12/2008) - Normal LV Systolic funciton with EF 60-65%. Grade 1 diastolid dysfunction. No regional wall motion abnormalities. Moderate pulmonary HTN with PA peak pressure 518mg.  . Marland KitchenOPD (chronic obstructive pulmonary disease)     Severe. Gold Stage IV.  PFTs (12/2008) - severe obstructive airway disease. Active tobacco use. Requires 4L O2 at home.  . Pulmonary nodule, right     Small right middle lobe nodule. Stable as of 12/2008.  . Marland Kitchenleep apnea     Presumptive. No documented sleep studies.   . Prediabetes 12/2008    HgbA1c 6.4 (12/2008)  . Hx MRSA infection     Recurrent MRSA thigh abscesses.  . Tobacco abuse     Ongoing.  . Obesity   . Hyperlipidemia   . GERD (gastroesophageal reflux disease)     S/P Nissen fundoplication.  . Shortness of breath   . CHF (congestive heart failure)    Past Surgical History  Procedure Laterality Date  . Total abdominal hysterectomy w/ bilateral salpingoophorectomy    . Nissen fundoplication     Family History  Problem Relation Age of Onset  . Heart disease Mother 4748  Deceased from MI at 59yo. Hypertension Mother   . Heart disease Father [id number]  Deceased of MI age 59yo. Hypertension Father   . Hypertension Brother   . Lung cancer      Grandmother   History  Substance Use Topics  .  Smoking status: Current Every Day Smoker -- 0.50 packs/day for 40 years    Types: Cigarettes  . Smokeless tobacco: Never Used     Comment: 2 per day  . Alcohol Use: No   OB History    No data available     Review of Systems  Constitutional: Negative for fever and chills.  HENT: Negative for congestion.   Eyes: Negative for visual disturbance.  Respiratory: Positive for cough, chest tightness and shortness of breath.   Cardiovascular: Negative for chest pain and leg swelling.  Gastrointestinal:  Negative for vomiting and abdominal pain.  Genitourinary: Negative for dysuria and flank pain.  Musculoskeletal: Negative for back pain, neck pain and neck stiffness.  Skin: Negative for rash.  Neurological: Negative for light-headedness and headaches.      Allergies  Fluconazole  Home Medications   Prior to Admission medications   Medication Sig Start Date End Date Taking? Authorizing Provider  albuterol (PROVENTIL HFA;VENTOLIN HFA) 108 (90 BASE) MCG/ACT inhaler Inhale 2 puffs into the lungs 3 (three) times daily as needed for wheezing or shortness of breath.   Yes Historical Provider, MD  albuterol (PROVENTIL) (2.5 MG/3ML) 0.083% nebulizer solution Take 2.5 mg by nebulization daily as needed for wheezing or shortness of breath.   Yes Historical Provider, MD  Aspirin-Salicylamide-Caffeine (BC HEADACHE POWDER PO) Take 1 packet by mouth 5 (five) times daily as needed (pain/headache).   Yes Historical Provider, MD  Fluticasone-Salmeterol (ADVAIR) 500-50 MCG/DOSE AEPB Inhale 2 puffs into the lungs 2 (two) times daily.    Yes Historical Provider, MD  furosemide (LASIX) 20 MG tablet Take 60 mg by mouth daily.   Yes Historical Provider, MD  lovastatin (MEVACOR) 20 MG tablet Take 1 tablet (20 mg total) by mouth at bedtime. 06/12/13  Yes Jessee Avers, MD  tiotropium (SPIRIVA HANDIHALER) 18 MCG inhalation capsule Place 1 capsule (18 mcg total) into inhaler and inhale daily. 06/12/13  Yes Jessee Avers, MD  azithromycin (ZITHROMAX Z-PAK) 250 MG tablet 2 po day one, then 1 daily x 4 days 02/22/14   Mariea Clonts, MD  predniSONE (DELTASONE) 20 MG tablet Take 3 tablets (60 mg total) by mouth daily with breakfast. 02/23/14   Mariea Clonts, MD   BP 154/92 mmHg  Pulse 84  Temp(Src) 98.1 F (36.7 C)  Resp 20  SpO2 90% Physical Exam  Constitutional: She is oriented to person, place, and time. She appears well-developed. No distress.  HENT:  Head: Normocephalic and atraumatic.  Eyes:  Conjunctivae are normal. Right eye exhibits no discharge. Left eye exhibits no discharge.  Neck: Normal range of motion. Neck supple. No tracheal deviation present.  Cardiovascular: Normal rate and regular rhythm.   Pulmonary/Chest: Effort normal. She has rales (fine crackles mid and lower lungs bilateral, and x-ray wheeze bilateral, no distress, nasal cannula in place).  Abdominal: Soft. She exhibits no distension. There is no tenderness. There is no guarding.  Musculoskeletal: She exhibits no edema or tenderness.  Neurological: She is alert and oriented to person, place, and time.  Skin: Skin is warm. No rash noted.  Psychiatric: She has a normal mood and affect.  Nursing note and vitals reviewed.   ED Course  Procedures (including critical care time) Labs Review Labs Reviewed  BASIC METABOLIC PANEL - Abnormal; Notable for the following:    Chloride 95 (*)    CO2 41 (*)    Glucose, Bld 134 (*)    All other components within normal limits  CBC WITH  DIFFERENTIAL - Abnormal; Notable for the following:    MCHC 29.9 (*)    Platelets 132 (*)    All other components within normal limits  PRO B NATRIURETIC PEPTIDE - Abnormal; Notable for the following:    Pro B Natriuretic peptide (BNP) 1172.0 (*)    All other components within normal limits  TROPONIN I  TROPONIN I    Imaging Review Dg Chest 2 View (if Patient Has Fever And/or Copd)  02/22/2014   CLINICAL DATA:  Shortness of breath starting today. Right back pain. Smoker. History of congestive heart failure, COPD.  EXAM: CHEST  2 VIEW  COMPARISON:  01/26/2014  FINDINGS: There is cardiomegaly. Mild hyperinflation of the lungs. Diffuse interstitial prominence could reflect interstitial edema. No confluent opacities or effusions. No acute bony abnormality.  IMPRESSION: Hyperinflation/COPD.  Cardiomegaly. Diffuse interstitial prominence, question early interstitial edema.   Electronically Signed   By: Rolm Baptise M.D.   On: 02/22/2014 19:24      EKG Interpretation   Date/Time:  Saturday February 22 2014 20:23:15 EST Ventricular Rate:  83 PR Interval:  135 QRS Duration: 104 QT Interval:  372 QTC Calculation: 437 R Axis:   100 Text Interpretation:  Sinus rhythm Atrial premature complex Consider RVH  w/ secondary repol abnormality T wave inversions improved since previous  Confirmed by Eshawn Coor  MD, Tisha Cline ([id number]) on 02/22/2014 9:37:00 PM      MDM   Final diagnoses:  Acute exacerbation of chronic obstructive pulmonary disease (COPD)  Bronchitis   Patient with significant lung disease history and heart disease presents with worsening dyspnea. Discussed differential likely COPD with possible bronchitis versus pneumonia versus mild pulmonary edema. Blood work, EKG, chest x-ray. Chest x-ray reviewed diffuse interstitial concerning for possible early edema.  20 mg IV Lasix given. Multiple rechecks in ER, patient feels improved after breathing treatment and being observed. Discussed observation/admission versus close follow-up outpatient on Monday. Patient was recently hospitalized and wishes to try to go home with close outpatient follow-up. Strict reasons to return given, patient will return for any worsening symptoms. Steroids given in ER. Plan for oral antibiotics, steroids at home. Pt has capacity to make decisions.   Initially patient hoped to go home however when bringing discharge papers in patient's oxygen in the upper 80s on 5 L which is more than her baseline of 4 L. Patient also mild sedate. We discussed the risk of sending home a night without being monitored the patient agreed to come in at least for observation versus full admission to ensure she turned the corner and improve.  [facility] antral medicine resident for bed placement. Repeat neb ordered. Patient received Lasix.  The patients results and plan were reviewed and discussed.   Any x-rays performed were personally reviewed by myself.   Differential diagnosis were  considered with the presenting HPI.  Medications  ipratropium-albuterol (DUONEB) 0.5-2.5 (3) MG/3ML nebulizer solution 3 mL (not administered)  dexamethasone (DECADRON) injection 10 mg (10 mg Intravenous Given 02/22/14 2048)  ipratropium-albuterol (DUONEB) 0.5-2.5 (3) MG/3ML nebulizer solution 3 mL (3 mLs Nebulization Given 02/22/14 2021)  furosemide (LASIX) injection 20 mg (20 mg Intravenous Given 02/22/14 2115)    Filed Vitals:   02/22/14 2200 02/22/14 2215 02/22/14 2245 02/22/14 2300  BP: 135/71 150/80 154/92 148/84  Pulse: 82 84 84 91  Temp:      Resp: _0 SpO2: 89% 87% 90% 87%    Final diagnoses:  Acute exacerbation of chronic obstructive pulmonary disease (COPD)  Bronchitis    Admission/ observation were discussed with the admitting physician, patient and/or family and they are comfortable with the plan.    Mariea Clonts, MD 02/23/14 [id number]

## 2014-02-22 NOTE — H&P (Signed)
Date: 02/22/2014               Patient Name:  [name] MRN: [id number]  DOB: February 09, 1956 Age / Sex: 58 y.o., female   PCP: Morgan Avers, MD         Medical Service: Internal Medicine Teaching Service         Attending Physician: Dr. Mariea Clonts, MD    First Contact: Dr. Posey [name] Pager: [phone]  Second Contact: Dr. Denton [name] Pager: [phone]       After Hours (After 5p/  First Contact Pager: [phone]  weekends / holidays): Second Contact Pager: [phone]   Chief Complaint: shortness of breath  History of Present Illness: 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 It started around 5 or 6pm as she was leaving a friend's house. Cough is productive of clear sputum. Albuterol improved her symptoms a little. She reports mild chest pain today mid sternal, nonradiating and throbbing in nature. No exacerbating or relieving factors. Denies sick contacts, recent travel. She has had her influenza vaccine. No past intubations. Continues to smoke 1/2 pack per day. Reports compliance of Advair and Spiriva. She has chills, mild increase in LE edema. Denies fevers, vision changes, hemoptysis, change in baseline exertional SOB or orthopnea, PND, nausea, vomiting, abdominal pain, diarrhea, dysuria, hematuria, rash, bleeding/bruising, edema, arthralgias, myalgias, LE pain, paresthesias, weakness, LH/dizziness. No history of clots.  She was last hospitalized 01/27/2014 to 02/02/2014 for acute on chronic respiratory failure with hypercapnia. She was treated with BiPAP, scheduled Duoneb tx, and IV solumedrol. She received IV Levaquin x 6 days and prednisone taper x 8 days.  She was seen in clinic 02/10/2014 for hospital follow up. At that time, she felt back to baseline in terms of her respiratory status. Also noted to be  noncompliant on lasix.  Meds: Current Facility-Administered Medications  Medication Dose Route Frequency Provider Last Rate Last Dose  . ipratropium-albuterol (DUONEB) 0.5-2.5 (3) MG/3ML nebulizer solution 3 mL  3 mL Nebulization Once Morgan Clonts, MD       Current Outpatient Prescriptions  Medication Sig Dispense Refill  . albuterol (PROVENTIL HFA;VENTOLIN HFA) 108 (90 BASE) MCG/ACT inhaler Inhale 2 puffs into the lungs 3 (three) times daily as needed for wheezing or shortness of breath.    Marland Kitchen albuterol (PROVENTIL) (2.5 MG/3ML) 0.083% nebulizer solution Take 2.5 mg by nebulization daily as needed for wheezing or shortness of breath.    . Aspirin-Salicylamide-Caffeine (BC HEADACHE POWDER PO) Take 1 packet by mouth 5 (five) times daily as needed (pain/headache).    . Fluticasone-Salmeterol (ADVAIR) 500-50 MCG/DOSE AEPB Inhale 2 puffs into the lungs 2 (two) times daily.     . furosemide (LASIX) 20 MG tablet Take 60 mg by mouth daily.    Marland Kitchen lovastatin (MEVACOR) 20 MG tablet Take 1 tablet (20 mg total) by mouth at bedtime. 30 tablet 11  . tiotropium (SPIRIVA HANDIHALER) 18 MCG inhalation capsule Place 1 capsule (18 mcg total) into inhaler and inhale daily. 30 capsule 12  . azithromycin (ZITHROMAX Z-PAK) 250 MG tablet 2 po day one, then 1 daily x 4 days 5 tablet 0  . [START ON 02/23/2014] predniSONE (DELTASONE) 20 MG tablet Take 3 tablets (60 mg total) by mouth daily with breakfast. 15 tablet 0    Allergies: Allergies  as of 02/22/2014 - Review Complete 02/22/2014  Allergen Reaction Noted  . Fluconazole Anaphylaxis    Past Medical History  Diagnosis Date  . Coronary artery disease     S/P PCI of LAD with DES (12/2008). Total occlusion of RCA noted at that time., medically managed. ACS ruled out 03/2009 with Lexiscan myoview . Followed by [facility].  . Pulmonary hypertension     2-D Echo (42/3536) - Systolic pressure was moderately increased. PA peak pressure  55mHg. secondary pulm htn likely  on basis of comb of interstital lung disease, severe copd, small airways disease, severe sleep apnea and cor pulmonale,. Followed by Dr. WJoya [name](Velora Heckler  . Diastolic dysfunction     2-D Echo (12/2008) - Normal LV Systolic funciton with EF 60-65%. Grade 1 diastolid dysfunction. No regional wall motion abnormalities. Moderate pulmonary HTN with PA peak pressure 560mg.  . Marland KitchenOPD (chronic obstructive pulmonary disease)     Severe. Gold Stage IV.  PFTs (12/2008) - severe obstructive airway disease. Active tobacco use. Requires 4L O2 at home.  . Pulmonary nodule, right     Small right middle lobe nodule. Stable as of 12/2008.  . Marland Kitchenleep apnea     Presumptive. No documented sleep studies.   . Prediabetes 12/2008    HgbA1c 6.4 (12/2008)  . Hx MRSA infection     Recurrent MRSA thigh abscesses.  . Tobacco abuse     Ongoing.  . Obesity   . Hyperlipidemia   . GERD (gastroesophageal reflux disease)     S/P Nissen fundoplication.  . Shortness of breath   . CHF (congestive heart failure)    Past Surgical History  Procedure Laterality Date  . Total abdominal hysterectomy w/ bilateral salpingoophorectomy    . Nissen fundoplication     Family History  Problem Relation Age of Onset  . Heart disease Mother 4764  Deceased from MI at 59yo. Hypertension Mother   . Heart disease Father 5466  Deceased of MI age 59yo. Hypertension Father   . Hypertension Brother   . Lung cancer      Grandmother   History   Social History  . Marital Status: Single    Spouse Name: N/A    Number of Children: N/A  . Years of Education: N/A   Occupational History  . Not on file.   Social History Main Topics  . Smoking status: Current Every Day Smoker -- 0.50 packs/day for 40 years    Types: Cigarettes  . Smokeless tobacco: Never Used     Comment: 2 per day  . Alcohol Use: No  . Drug Use: No  . Sexual Activity: Not on file   Other Topics Concern  . Not on file   Social History Narrative   Formerly  worked as a caScientist, water qualitynow disabled.   Divorced.   2 grown children.   Lives with her grandson.    Review of Systems: Constitutional: no fevers, +chills Eyes: no vision changes Ears, nose, mouth, throat, and face: +cough Respiratory: +shortness of breath Cardiovascular: +chest pain Gastrointestinal: no nausea/vomiting, no abdominal pain, no constipation, no diarrhea Genitourinary: no dysuria, no hematuria Integument: no rash Hematologic/lymphatic: no bleeding/bruising, +edema Musculoskeletal: no arthralgias, no myalgias Neurological: no paresthesias, no weakness  Physical Exam: Blood pressure 148/84, pulse 91, temperature 98.1 F (36.7 C), resp. rate 16, SpO2 87 %. 91% on 4L [address] General Apperance: NAD Head: Normocephalic, atraumatic Eyes: PERRL, EOMI, anicteric sclera Ears: Normal external ear canal Nose:  Nares normal, septum midline, mucosa normal Throat: Lips, mucosa and tongue normal  Neck: Supple, trachea midline, no JVD Back: No tenderness or bony abnormality  Lungs: Clear to auscultation bilaterally. No wheezes, rhonchi. Rare crackles. Breathing comfortably on 4L [place] Chest Wall: Nontender, no deformity Heart: Regular rate and rhythm, no murmur/rub/gallop Abdomen: Soft, nontender, nondistended, no rebound/guarding Extremities: Normal, atraumatic, warm and well perfused, 2+ pitting edema to midshin Pulses: 2+ throughout Skin: No rashes or lesions Neurologic: Alert and oriented x 3. CNII-XII intact. Normal strength and sensation  Lab results: Basic Metabolic Panel:  Recent Labs  02/22/14 1941  NA 142  K 3.7  CL 95*  CO2 41*  GLUCOSE 134*  BUN 8  CREATININE 0.77  CALCIUM 8.9   CBC:  Recent Labs  02/22/14 1941  WBC 6.1  NEUTROABS 3.8  HGB 12.8  HCT 42.8  MCV 98.2  PLT 132*   Cardiac Enzymes:  Recent Labs  02/22/14 1941 02/22/14 2149  TROPONINI <0.30 <0.30   BNP:  Recent Labs  02/22/14 1941  PROBNP 1172.0*   Misc. Labs: ABG    Component  Value Date/Time   PHART 7.306* 02/23/2014 0059   PCO2ART 96.0* 02/23/2014 0059   PO2ART 60.0* 02/23/2014 0059   HCO3 47.9* 02/23/2014 0059   TCO2 >50 02/23/2014 0059   O2SAT 86.0 02/23/2014 0059   Imaging results:  Dg Chest 2 View (if Patient Has Fever And/or Copd)  02/22/2014   CLINICAL DATA:  Shortness of breath starting today. Right back pain. Smoker. History of congestive heart failure, COPD.  EXAM: CHEST  2 VIEW  COMPARISON:  01/26/2014  FINDINGS: There is cardiomegaly. Mild hyperinflation of the lungs. Diffuse interstitial prominence could reflect interstitial edema. No confluent opacities or effusions. No acute bony abnormality.  IMPRESSION: Hyperinflation/COPD.  Cardiomegaly. Diffuse interstitial prominence, question early interstitial edema.   Electronically Signed   By: Rolm Baptise M.D.   On: 02/22/2014 19:24    Other results: EKG: Normal sinus rhythm, T wave inversion in III, aVF, V2, V3, V4 seen in previous EKG. T wave normalization in V6.  Assessment & Plan by Problem: Active Problems:   Acute on chronic respiratory failure with hypercapnia  Acute on chronic hypercarbic respiratory failure: Concerned for COPD exacerbation. Infection unlikely as she is afebrile with no leukocytosis. Low risk for PE by Wells and Geneva score. She has a history of grade 1 diastolic CHF (last echo 10/09/6765 - EF 55-60%). EKG without acute ischemic changes and troponins negative x 2. Her weight is 253lb from 256 on 02/10/2014. She is noncompliant with her Lasix therapy at home. Her Pro BNP is elevated to 1172 but decreased from previous (2153 on 01/26/2014). CXR with diffuse interstitial prominence. In the ED, she was given Decadron 42m IV x1, and duonebs x 2. An ABG found her pCO2 to be 96. -IV cefepime  -continue home advair as dulera 200-5 mcg 2 puff BID -Duoneb 362mQ6hr -BiPAP -home spiriva 1853mdaily held -Sputum culture -O2 prn to maintain sat 88-20-94%iastolic CHF: Last echo 8/67/0/9628 EF 55-60%. Her weight is 253lb from 256 on 02/10/2014. She is noncompliant with her Lasix therapy at home. Her Pro BNP is elevated to 1172 but decreased from previous (2153 on 01/26/2014). EKG without acute ischemic changes and troponins negative x 2. She received lasix 63m76m x 1 in the ED. -Hold home lasix 60mg59mdaily -Daily weight, strict i/os -Repeat EKG in AM -Trend troponin  HLD:  -Continue home lovastatin  73m daily as pravastatin 2106mdaily  FEN:  -heart healthy  DVT prophylaxis: subq heparin 5000u TID  Dispo: Disposition is deferred at this time, awaiting improvement of current medical problems. Anticipated discharge in approximately 1-2 day(s).   The patient does have a current PCP (RJessee AversMD) [facility].  The patient does not have transportation limitations that hinder transportation to clinic appointments.  Signed: JeJacques EarthlyMD 02/22/2014, 11:25 PM

## 2014-02-23 ENCOUNTER — Encounter (HOSPITAL_COMMUNITY): Payer: Self-pay | Admitting: *Deleted

## 2014-02-23 DIAGNOSIS — I272 Other secondary pulmonary hypertension: Secondary | ICD-10-CM | POA: Diagnosis present

## 2014-02-23 DIAGNOSIS — I2510 Atherosclerotic heart disease of native coronary artery without angina pectoris: Secondary | ICD-10-CM | POA: Diagnosis present

## 2014-02-23 DIAGNOSIS — Z7951 Long term (current) use of inhaled steroids: Secondary | ICD-10-CM | POA: Diagnosis not present

## 2014-02-23 DIAGNOSIS — I5033 Acute on chronic diastolic (congestive) heart failure: Secondary | ICD-10-CM | POA: Diagnosis present

## 2014-02-23 DIAGNOSIS — Z720 Tobacco use: Secondary | ICD-10-CM

## 2014-02-23 DIAGNOSIS — R0602 Shortness of breath: Secondary | ICD-10-CM | POA: Diagnosis present

## 2014-02-23 DIAGNOSIS — K219 Gastro-esophageal reflux disease without esophagitis: Secondary | ICD-10-CM | POA: Diagnosis present

## 2014-02-23 DIAGNOSIS — E785 Hyperlipidemia, unspecified: Secondary | ICD-10-CM | POA: Diagnosis present

## 2014-02-23 DIAGNOSIS — J9622 Acute and chronic respiratory failure with hypercapnia: Secondary | ICD-10-CM | POA: Diagnosis present

## 2014-02-23 DIAGNOSIS — J441 Chronic obstructive pulmonary disease with (acute) exacerbation: Secondary | ICD-10-CM | POA: Diagnosis present

## 2014-02-23 DIAGNOSIS — Z9119 Patient's noncompliance with other medical treatment and regimen: Secondary | ICD-10-CM | POA: Diagnosis present

## 2014-02-23 DIAGNOSIS — Z9981 Dependence on supplemental oxygen: Secondary | ICD-10-CM | POA: Diagnosis not present

## 2014-02-23 DIAGNOSIS — I2781 Cor pulmonale (chronic): Secondary | ICD-10-CM | POA: Diagnosis present

## 2014-02-23 DIAGNOSIS — G4730 Sleep apnea, unspecified: Secondary | ICD-10-CM | POA: Diagnosis present

## 2014-02-23 DIAGNOSIS — J9620 Acute and chronic respiratory failure, unspecified whether with hypoxia or hypercapnia: Secondary | ICD-10-CM | POA: Diagnosis present

## 2014-02-23 DIAGNOSIS — F17210 Nicotine dependence, cigarettes, uncomplicated: Secondary | ICD-10-CM | POA: Diagnosis present

## 2014-02-23 DIAGNOSIS — Z9861 Coronary angioplasty status: Secondary | ICD-10-CM | POA: Diagnosis not present

## 2014-02-23 DIAGNOSIS — Z888 Allergy status to other drugs, medicaments and biological substances status: Secondary | ICD-10-CM | POA: Diagnosis not present

## 2014-02-23 LAB — CBC
HEMATOCRIT: 45.6 % (ref 36.0–46.0)
HEMOGLOBIN: 13.5 g/dL (ref 12.0–15.0)
MCH: 29.3 pg (ref 26.0–34.0)
MCHC: 29.6 g/dL — ABNORMAL LOW (ref 30.0–36.0)
MCV: 99.1 fL (ref 78.0–100.0)
Platelets: 132 10*3/uL — ABNORMAL LOW (ref 150–400)
RBC: 4.6 MIL/uL (ref 3.87–5.11)
RDW: 14.2 % (ref 11.5–15.5)
WBC: 4.1 10*3/uL (ref 4.0–10.5)

## 2014-02-23 LAB — COMPREHENSIVE METABOLIC PANEL
ALBUMIN: 3.2 g/dL — AB (ref 3.5–5.2)
ALT: 8 U/L (ref 0–35)
ANION GAP: 9 (ref 5–15)
AST: 11 U/L (ref 0–37)
Alkaline Phosphatase: 111 U/L (ref 39–117)
BUN: 7 mg/dL (ref 6–23)
CALCIUM: 9.1 mg/dL (ref 8.4–10.5)
CO2: 40 mEq/L (ref 19–32)
Chloride: 96 mEq/L (ref 96–112)
Creatinine, Ser: 0.68 mg/dL (ref 0.50–1.10)
GFR calc non Af Amer: >90 mL/min (ref >90)
Glucose, Bld: 207 mg/dL — ABNORMAL HIGH (ref 70–99)
Potassium: 4.7 mEq/L (ref 3.7–5.3)
Sodium: 145 mEq/L (ref 137–147)
TOTAL PROTEIN: 6.7 g/dL (ref 6.0–8.3)
Total Bilirubin: 0.3 mg/dL (ref 0.3–1.2)

## 2014-02-23 LAB — POCT I-STAT 3, ART BLOOD GAS (G3+)
Acid-Base Excess: 18 mmol/L — ABNORMAL HIGH (ref 0.0–2.0)
BICARBONATE: 49.5 meq/L — AB (ref 20.0–24.0)
O2 Saturation: 88 %
PCO2 ART: 96.3 mmHg — AB (ref 35.0–45.0)
pH, Arterial: 7.319 — ABNORMAL LOW (ref 7.350–7.450)
pO2, Arterial: 64 mmHg — ABNORMAL LOW (ref 80.0–100.0)

## 2014-02-23 LAB — I-STAT ARTERIAL BLOOD GAS, ED
ACID-BASE EXCESS: 16 mmol/L — AB (ref 0.0–2.0)
BICARBONATE: 47.9 meq/L — AB (ref 20.0–24.0)
O2 Saturation: 86 %
pCO2 arterial: 96 mmHg (ref 35.0–45.0)
pH, Arterial: 7.306 — ABNORMAL LOW (ref 7.350–7.450)
pO2, Arterial: 60 mmHg — ABNORMAL LOW (ref 80.0–100.0)

## 2014-02-23 LAB — TROPONIN I: Troponin I: <0.3 ng/mL (ref <0.30)

## 2014-02-23 MED ORDER — DEXTROSE 5 % IV SOLN
1.0000 g | Freq: Three times a day (TID) | INTRAVENOUS | Status: DC
  Administered 2014-02-23: 1 g via INTRAVENOUS
  Filled 2014-02-23 (×3): qty 1

## 2014-02-23 MED ORDER — MOMETASONE FURO-FORMOTEROL FUM 200-5 MCG/ACT IN AERO
2.0000 | INHALATION_SPRAY | Freq: Two times a day (BID) | RESPIRATORY_TRACT | Status: DC
  Administered 2014-02-23 – 2014-02-24 (×3): 2 via RESPIRATORY_TRACT
  Filled 2014-02-23: qty 8.8

## 2014-02-23 MED ORDER — HEPARIN SODIUM (PORCINE) 5000 UNIT/ML IJ SOLN
5000.0000 [IU] | Freq: Three times a day (TID) | INTRAMUSCULAR | Status: DC
  Administered 2014-02-23 – 2014-02-24 (×4): 5000 [IU] via SUBCUTANEOUS
  Filled 2014-02-23 (×7): qty 1

## 2014-02-23 MED ORDER — PRAVASTATIN SODIUM 20 MG PO TABS
20.0000 mg | ORAL_TABLET | Freq: Every day | ORAL | Status: DC
  Administered 2014-02-23: 20 mg via ORAL
  Filled 2014-02-23 (×2): qty 1

## 2014-02-23 MED ORDER — HEPARIN SODIUM (PORCINE) 5000 UNIT/ML IJ SOLN: 5000.0000 [IU] | Freq: Three times a day (TID) | INTRAMUSCULAR | Status: DC

## 2014-02-23 MED ORDER — AZITHROMYCIN 250 MG PO TABS
250.0000 mg | ORAL_TABLET | Freq: Every day | ORAL | Status: DC
  Administered 2014-02-24: 250 mg via ORAL
  Filled 2014-02-23: qty 1

## 2014-02-23 MED ORDER — SODIUM CHLORIDE 0.9 % IJ SOLN
3.0000 mL | Freq: Two times a day (BID) | INTRAMUSCULAR | Status: DC
  Administered 2014-02-23: 3 mL via INTRAVENOUS

## 2014-02-23 MED ORDER — FUROSEMIDE 40 MG PO TABS
60.0000 mg | ORAL_TABLET | Freq: Every day | ORAL | Status: DC
  Administered 2014-02-23 – 2014-02-24 (×2): 60 mg via ORAL
  Filled 2014-02-23 (×2): qty 1

## 2014-02-23 MED ORDER — PREDNISONE 20 MG PO TABS
40.0000 mg | ORAL_TABLET | Freq: Every day | ORAL | Status: DC
  Administered 2014-02-23 – 2014-02-24 (×2): 40 mg via ORAL
  Filled 2014-02-23 (×3): qty 2

## 2014-02-23 MED ORDER — IPRATROPIUM-ALBUTEROL 0.5-2.5 (3) MG/3ML IN SOLN
3.0000 mL | Freq: Four times a day (QID) | RESPIRATORY_TRACT | Status: DC
  Administered 2014-02-23 – 2014-02-24 (×4): 3 mL via RESPIRATORY_TRACT
  Filled 2014-02-23 (×6): qty 3

## 2014-02-23 MED ORDER — SODIUM CHLORIDE 0.9 % IJ SOLN
3.0000 mL | Freq: Two times a day (BID) | INTRAMUSCULAR | Status: DC
  Administered 2014-02-23 (×2): 3 mL via INTRAVENOUS

## 2014-02-23 MED ORDER — AZITHROMYCIN 500 MG PO TABS
500.0000 mg | ORAL_TABLET | Freq: Every day | ORAL | Status: AC
  Administered 2014-02-23: 500 mg via ORAL
  Filled 2014-02-23: qty 1

## 2014-02-23 MED ORDER — LORAZEPAM 1 MG PO TABS
1.0000 mg | ORAL_TABLET | Freq: Once | ORAL | Status: AC
  Administered 2014-02-23: 1 mg via ORAL
  Filled 2014-02-23: qty 1

## 2014-02-23 NOTE — Progress Notes (Signed)
58yo female to begin IV ABX for COPD exacerbation.  Will start cefepime 1g IV Q8H for CrCl ~95 ml/min and monitor CBC and Cx.  Wynona Neat, PharmD, BCPS 02/23/2014 2:20 AM

## 2014-02-23 NOTE — ED Notes (Signed)
Bipap stopped, patient refused. Redmond Pulling, DO aware.

## 2014-02-23 NOTE — Progress Notes (Signed)
Patient refused BIPAP after 5 minutes could not tolerate. RN made aware

## 2014-02-23 NOTE — Progress Notes (Signed)
CRITICAL VALUE ALERT  Critical value received:  CO2 40  Date of notification:   02/23/2014  Time of notification:  0629  Critical value read back:Yes.    Nurse who received alert:  Tyrell Antonio  MD notified (1st page):  Lia Hopping cover,MD  Time of first page:  [phone]  MD notified (2nd page):  Time of second page:  Responding MD:  Lia Hopping cover, MD  Time MD responded: [phone]

## 2014-02-23 NOTE — ED Notes (Signed)
RT called to inform of need for Bipap.

## 2014-02-23 NOTE — Progress Notes (Signed)
Patient arrived via stretcher from ED. 02 at 4L/[place] and home oxygen tank with patient upon arrival to room. Patient alert and cooperative, but refused to have the MRSA swab done. "Each one of [place] have MRSA and I don't like those blue gowns." CHG bath only 1/2 completed. Patient refused to remove pants. Isolation gowns placed outside of room due to not knowing if patient is active MRSA or not, she has a positive history. VSS  and patient is NSR on monitor. 02 sats remain 89 to 90% on 4.5L. Patient refused to wear Bi-Pap. Will monitor closely.

## 2014-02-23 NOTE — ED Notes (Addendum)
Patient initially refused to have Bipap therapy started, but then agreed. Currently using bedside commode.

## 2014-02-23 NOTE — Progress Notes (Signed)
Patient educated in the need for continuous BiPap use per MD orders, patient refused overnight due to per patient statement of being claustrophobic. [name] is now willing to give the Bipap a try. Patient educated  about risk for respiratory compromise.  Morgan Dike RN

## 2014-02-23 NOTE — ED Notes (Signed)
Admitting residents at bedside

## 2014-02-23 NOTE — Progress Notes (Addendum)
Subjective: No complaints today. Saturating 88-91% on 4L. Stable and comfortable. No fevers. Pt refused BIPAP. But apperas very comfortable without it.  Objective: Vital signs in last 24 hours: Filed Vitals:   02/23/14 0742 02/23/14 0900 02/23/14 0908 02/23/14 0958  BP: 142/78   110/79  Pulse: 88 86  82  Temp: 98.1 F (36.7 C)     TempSrc: Oral     Resp: _0 Height:      Weight:      SpO2: 89% 86% 93% 93%   Weight change:   Intake/Output Summary (Last 24 hours) at 02/23/14 1009 Last data filed at 02/23/14 0900  Gross per 24 hour  Intake     50 ml  Output   1400 ml  Net  -1350 ml   GENERAL- alert,  appears as stated age, not in any distress. HEENT- Atraumatic, normocephalic, PERRL, EOMI, oral mucosa appears moist, neck supple. CARDIAC- RRR, no murmurs, rubs or gallops. RESP- Moving equal volumes of air but minimal air movement.  ABDOMEN- Soft, nontender, no guarding or rebound. EXTREMITIES- Pitting pedal edema. SKIN- Warm, dry, No rash or lesion. PSYCH- Normal mood and affect, appropriate thought content and speech.  Lab Results: Basic Metabolic Panel:  Recent Labs Lab 02/22/14 1941 02/23/14 0510  NA 142 145  K 3.7 4.7  CL 95* 96  CO2 41* 40*  GLUCOSE 134* 207*  BUN 8 7  CREATININE 0.77 0.68  CALCIUM 8.9 9.1   Liver Function Tests:  Recent Labs Lab 02/23/14 0510  AST 11  ALT 8  ALKPHOS 111  BILITOT 0.3  PROT 6.7  ALBUMIN 3.2*   CBC:  Recent Labs Lab 02/22/14 1941 02/23/14 0510  WBC 6.1 4.1  NEUTROABS 3.8  --   HGB 12.8 13.5  HCT 42.8 45.6  MCV 98.2 99.1  PLT 132* 132*   Cardiac Enzymes:  Recent Labs Lab 02/22/14 1941 02/22/14 2149 02/23/14 0510  TROPONINI <0.30 <0.30 <0.30   BNP:  Recent Labs Lab 02/22/14 1941  PROBNP 1172.0*   Studies/Results: Dg Chest 2 View (if Patient Has Fever And/or Copd)  02/22/2014   CLINICAL DATA:  Shortness of breath starting today. Right back pain. Smoker. History of congestive heart  failure, COPD.  EXAM: CHEST  2 VIEW  COMPARISON:  01/26/2014  FINDINGS: There is cardiomegaly. Mild hyperinflation of the lungs. Diffuse interstitial prominence could reflect interstitial edema. No confluent opacities or effusions. No acute bony abnormality.  IMPRESSION: Hyperinflation/COPD.  Cardiomegaly. Diffuse interstitial prominence, question early interstitial edema.   Electronically Signed   By: Rolm Baptise M.D.   On: 02/22/2014 19:24   Medications: I have reviewed the patient's current medications. Scheduled Meds: . ceFEPime (MAXIPIME) IV  1 g Intravenous 3 times per day  . heparin  5,000 Units Subcutaneous 3 times per day  . ipratropium-albuterol  3 mL Nebulization Q6H  . mometasone-formoterol  2 puff Inhalation BID  . pravastatin  20 mg Oral q1800  . sodium chloride  3 mL Intravenous Q12H  . sodium chloride  3 mL Intravenous Q12H   Continuous Infusions:  PRN Meds:. Assessment/Plan:  Acute on chronic hypercarbic respiratory failure: Pt appears comfortable. On 4L of room air. Her weight is 253lb from 256 on 02/10/2014. She is noncompliant with her Lasix therapy at home. Her Pro BNP is elevated to 1172 but decreased from previous (2153 on 01/26/2014). CXR with diffuse interstitial prominence.  -IV cefepime - D/c no evidence of infection. -Continue home advair as  dulera 200-5 mcg 2 puff BID -BiPAP- Hold pending ABG at 11am this morning.  -Home spiriva 53mg daily held -O2 prn to maintain sat 88-92% - Will discharge home on Azithro.  Diastolic CHF: Last echo 83/08/6699- EF 55-60%. Her weight is 253lb from 256 on 02/10/2014. She is noncompliant with her Lasix therapy at home.  She received lasix 2105mIV x 1 in the ED. - Discharge home on home lasix- home lasix 6037mO daily -Daily weight, strict i/os -Repeat EKG in AM -Pt encouraged to take home lasix, says its is a challenge having to use the bathroom, frequently when she is out of the house.  HLD:  -Continue home lovastatin 69m44maily as pravastatin 69mg33mly  FEN:  -heart healthy  DVT prophylaxis: subq heparin 5000u TID Dispo: Disposition is deferred at this time, awaiting improvement of current medical problems.  Anticipated discharge in approximately tomorrow.  Addendum- Pt ABG still with Elevated PCo2- 96, Ph- 7.31. No significant change. Pt is on Bipap. Cont Overnight. Give a break about 5pm, so pt can eat. Then continue BIPAP later overnight. Recheck ABG in the Am.   The patient does have a current PCP (RichJessee Avers and [facility].  The patient does not have transportation limitations that hinder transportation to clinic appointments.  .Services Needed at time of discharge: Y = Yes, Blank = No PT:   OT:   RN:   Equipment:   Other:     LOS: 1 day   EjiroBethena Roys11/22/2015, 10:09 AM

## 2014-02-23 NOTE — ED Notes (Signed)
Bipap placed by RT.

## 2014-02-24 DIAGNOSIS — J9622 Acute and chronic respiratory failure with hypercapnia: Secondary | ICD-10-CM

## 2014-02-24 LAB — POCT I-STAT 3, ART BLOOD GAS (G3+)
Acid-Base Excess: 20 mmol/L — ABNORMAL HIGH (ref 0.0–2.0)
Bicarbonate: 50.5 mEq/L — ABNORMAL HIGH (ref 20.0–24.0)
O2 SAT: 83 %
PO2 ART: 50 mmHg — AB (ref 80.0–100.0)
Patient temperature: 98.6
pCO2 arterial: 78.1 mmHg (ref 35.0–45.0)
pH, Arterial: 7.419 (ref 7.350–7.450)

## 2014-02-24 MED ORDER — PREDNISONE 10 MG PO TABS: ORAL_TABLET | ORAL | Status: DC

## 2014-02-24 MED ORDER — AZITHROMYCIN 250 MG PO TABS: ORAL_TABLET | ORAL | Status: DC

## 2014-02-24 NOTE — Progress Notes (Signed)
Unable to perform ABG on RA.  SPO2 dropped to 82% within a minute of taking off O2.  Placed pt back on 4L SPO2 86%

## 2014-02-24 NOTE — Progress Notes (Signed)
Subjective: This AM, she was taken off BiPAP and able to tolerate breakfast. She feels ready to go home. We reiterated the importance of taking her Lasix as it will relieve stress on her lungs.   Objective: Vital signs in last 24 hours: Filed Vitals:   02/24/14 0400 02/24/14 0453 02/24/14 0500 02/24/14 0755  BP: 138/64 138/64  145/65  Pulse: 64 65 66 68  Temp:    97.7 F (36.5 C)  TempSrc:    Oral  Resp: _0 Height:      Weight:   251 lb (113.853 kg)   SpO2: 93% 93% 90% 97%   Weight change: -2 lb 4.9 oz (-1.047 kg)  Intake/Output Summary (Last 24 hours) at 02/24/14 0830 Last data filed at 02/24/14 0500  Gross per 24 hour  Intake     50 ml  Output   2250 ml  Net  -2200 ml   General: resting in bed, wearing BiPAP mask HEENT: PERRL, EOMI, no scleral icterus Cardiac: RRR, no rubs, murmurs or gallops Pulm: clear to auscultation bilaterally, no wheezes, rales, or rhonchi though limited by low breath sounds Abd: soft, nontender, nondistended, BS present Ext: warm and well perfused, trace 1+ pitting edema Neuro: responds to questions appropriately; moving all extremities freely   Lab Results: Basic Metabolic Panel:  Recent Labs Lab 02/22/14 1941 02/23/14 0510  NA 142 145  K 3.7 4.7  CL 95* 96  CO2 41* 40*  GLUCOSE 134* 207*  BUN 8 7  CREATININE 0.77 0.68  CALCIUM 8.9 9.1   Liver Function Tests:  Recent Labs Lab 02/23/14 0510  AST 11  ALT 8  ALKPHOS 111  BILITOT 0.3  PROT 6.7  ALBUMIN 3.2*   CBC:  Recent Labs Lab 02/22/14 1941 02/23/14 0510  WBC 6.1 4.1  NEUTROABS 3.8  --   HGB 12.8 13.5  HCT 42.8 45.6  MCV 98.2 99.1  PLT 132* 132*   Cardiac Enzymes:  Recent Labs Lab 02/22/14 1941 02/22/14 2149 02/23/14 0510  TROPONINI <0.30 <0.30 <0.30   BNP:  Recent Labs Lab 02/22/14 1941  PROBNP 1172.0*   Studies/Results: Dg Chest 2 View (if Patient Has Fever And/or Copd)  02/22/2014   CLINICAL DATA:  Shortness of breath starting  today. Right back pain. Smoker. History of congestive heart failure, COPD.  EXAM: CHEST  2 VIEW  COMPARISON:  01/26/2014  FINDINGS: There is cardiomegaly. Mild hyperinflation of the lungs. Diffuse interstitial prominence could reflect interstitial edema. No confluent opacities or effusions. No acute bony abnormality.  IMPRESSION: Hyperinflation/COPD.  Cardiomegaly. Diffuse interstitial prominence, question early interstitial edema.   Electronically Signed   By: Rolm Baptise M.D.   On: 02/22/2014 19:24   Medications: I have reviewed the patient's current medications. Scheduled Meds: . azithromycin  250 mg Oral Daily  . furosemide  60 mg Oral Daily  . heparin  5,000 Units Subcutaneous 3 times per day  . ipratropium-albuterol  3 mL Nebulization Q6H  . mometasone-formoterol  2 puff Inhalation BID  . pravastatin  20 mg Oral q1800  . predniSONE  40 mg Oral Q breakfast  . sodium chloride  3 mL Intravenous Q12H  . sodium chloride  3 mL Intravenous Q12H   Continuous Infusions:  PRN Meds:. Assessment/Plan:  Acute on chronic hypercarbic respiratory failure: Likely 2/2 diastolic CHF in the setting of non-adherence to treatment with possible COPD exacerbation. Wt 241 lbs today, down from 256 on 02/10/2014. Leg edema improved.  -Discharge on  home Lasix 33m -Resume home COPD meds -Discharge on prednisone taper: 433mx 3 days, 2037m 3 days, 10 mg x 3 days  -Discharge on azithromycin 250m70m3 days  HLD:JQG:BEEFEOe statin.   FEN:  -Diet: Heart healthy  DVT prophylaxis: subq heparin 5000u TID  Dispo: Disposition is deferred at this time, awaiting improvement of current medical problems.      The patient does have a current PCP (RicJessee Avers) and [facility].  The patient does not have transportation limitations that hinder transportation to clinic appointments.  .Services Needed at time of discharge: Y = Yes, Blank = No PT:   OT:     RN:   Equipment:   Other:     LOS: 2 days   RushCharlott Rakes 02/24/2014, 8:30 AM

## 2014-02-24 NOTE — Progress Notes (Signed)
CRITICAL VALUE ALERT  Critical value received:  abg results PO2 50; PCO2 78.1  Date of notification:  02/24/14  Time of notification:  12:05  Critical value read back:Yes.    Nurse who received alert:  Tamar Miano Mariana Arn   MD notified (1st page):  Dr. Posey Pronto  Time of first page: 1205  MD notified (2nd page):  Time of second page:  Responding MD:  Dr. Posey Pronto  Time MD responded:  1207

## 2014-02-24 NOTE — Discharge Summary (Signed)
Name: [name] MRN: [id number] DOB: [birth year] 58 y.o. PCP: Jessee Avers, MD  Date of Admission: 02/22/2014  7:11 PM Date of Discharge: 02/24/2014 Attending Physician: Aldine Contes, MD  Discharge Diagnosis: Principal Problem:   Acute on chronic respiratory failure with hypercapnia Active Problems:   COPD exacerbation   Acute on chronic diastolic CHF (congestive heart failure)  Discharge Medications:   Medication List    STOP taking these medications        BC HEADACHE POWDER PO      TAKE these medications        albuterol 108 (90 BASE) MCG/ACT inhaler  Commonly known as:  PROVENTIL HFA;VENTOLIN HFA  Inhale 2 puffs into the lungs 3 (three) times daily as needed for wheezing or shortness of breath.     albuterol (2.5 MG/3ML) 0.083% nebulizer solution  Commonly known as:  PROVENTIL  Take 2.5 mg by nebulization daily as needed for wheezing or shortness of breath.     azithromycin 250 MG tablet  Commonly known as:  ZITHROMAX Z-PAK  Take 1 tablet per day until you run out.     Fluticasone-Salmeterol 500-50 MCG/DOSE Aepb  Commonly known as:  ADVAIR  Inhale 2 puffs into the lungs 2 (two) times daily.     furosemide 20 MG tablet  Commonly known as:  LASIX  Take 60 mg by mouth daily.     lovastatin 20 MG tablet  Commonly known as:  MEVACOR  Take 1 tablet (20 mg total) by mouth at bedtime.     predniSONE 10 MG tablet  Commonly known as:  DELTASONE  Take 4 tablets for three days, 2 tablets for three days, and 1 tablet for three days.     tiotropium 18 MCG inhalation capsule  Commonly known as:  SPIRIVA HANDIHALER  Place 1 capsule (18 mcg total) into inhaler and inhale daily.        Disposition and follow-up:   Ms.Jamiah Pricsilla Lindvall was discharged from [facility] in Stable condition.  At the hospital follow up visit please address:  1.  DNR/DNI status: clarify given that she has indicated it at prior hospitalizations and was  listed as Full Code on most recent; needs to be revisited frequently given the severity of her lung disease  2.  Adherence to Lasix & COPD meds  3.  Labs / imaging needed at time of follow-up: none   3.  Pending labs/ test needing follow-up: none  Follow-up Appointments: Follow-up Information    Follow up with Jacques Earthly, MD. Go on 03/03/2014.   Specialty:  Internal Medicine   Why:  130 PM   Contact information:   [place] [address] [phone]       Discharge Instructions: Discharge Instructions    Call MD for:  difficulty breathing, headache or visual disturbances    Complete by:  As directed      Call MD for:  persistant dizziness or light-headedness    Complete by:  As directed      Call MD for:  persistant nausea and vomiting    Complete by:  As directed      Call MD for:  temperature >100.4    Complete by:  As directed      Diet - low sodium heart healthy    Complete by:  As directed      Increase activity slowly    Complete by:  As directed  Consultations:    Procedures Performed:  Dg Chest 2 View (if Patient Has Fever And/or Copd)  02/22/2014   CLINICAL DATA:  Shortness of breath starting today. Right back pain. Smoker. History of congestive heart failure, COPD.  EXAM: CHEST  2 VIEW  COMPARISON:  01/26/2014  FINDINGS: There is cardiomegaly. Mild hyperinflation of the lungs. Diffuse interstitial prominence could reflect interstitial edema. No confluent opacities or effusions. No acute bony abnormality.  IMPRESSION: Hyperinflation/COPD.  Cardiomegaly. Diffuse interstitial prominence, question early interstitial edema.   Electronically Signed   By: Rolm Baptise M.D.   On: 02/22/2014 19:24   Dg Chest 2 View  01/26/2014   CLINICAL DATA:  Initial evaluation for shortness of breath, cough. History of COPD. Smoker.  EXAM: CHEST  2 VIEW  COMPARISON:  Prior radiograph from 12/22/2013  FINDINGS: Study is limited by technique patient is  rotated to the right. Cardiomegaly is grossly stable from prior exam. Mediastinal silhouette within normal limits.  Changes suggesting COPD again seen. No focal infiltrate to suggest acute infectious pneumonitis. There is mild diffuse pulmonary vascular congestion without frank pulmonary edema. No pleural effusion. No pneumothorax.  No acute osseus abnormality. Surgical clips overlie the GE junction.  IMPRESSION: 1. Cardiomegaly with mild diffuse pulmonary vascular congestion without overt pulmonary edema. 2. COPD.   Electronically Signed   By: Jeannine Boga M.D.   On: 01/26/2014 21:39   Dg Hand Complete Left  01/28/2014   CLINICAL DATA:  Bilateral hand pain. No injury. Initial evaluation.  EXAM: LEFT HAND - COMPLETE 3+ VIEW  COMPARISON:  None.  FINDINGS: Subtle nondisplaced fracture of the distal shaft of the left fifth metacarpal cannot be excluded. This is identified only on one view. Lucency may just represent a soft tissue fold. No other acute bony abnormalities identified. Diffuse degenerative change.  IMPRESSION: 1. Cannot exclude subtle nondisplaced fracture of the distal shaft of the left fifth metacarpal. This is seen only on one view . Lucency identified may just represent a soft tissue fold. 2. Diffuse degenerative change.   Electronically Signed   By: [place]   On: 01/28/2014 21:56   Dg Hand Complete Right  01/28/2014   CLINICAL DATA:  Bilateral hand pain. No known injury. Initial evaluation.  EXAM: RIGHT HAND - COMPLETE 3+ VIEW  COMPARISON:  None.  FINDINGS: No acute soft tissue bony abnormality identified. Diffuse mild degenerative change.  IMPRESSION: No acute bony abnormality.  Diffuse mild degenerative change.   Electronically Signed   By: Marcello Moores  Register   On: 01/28/2014 21:59    Admission HPI: Ms. 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 It started around 5 or 6pm as she was leaving a friend's house. Cough is productive of clear sputum. Albuterol improved her symptoms a little. She reports mild chest pain today mid sternal, nonradiating and throbbing in nature. No exacerbating or relieving factors. Denies sick contacts, recent travel. She has had her influenza vaccine. No past intubations. Continues to smoke 1/2 pack per day. Reports compliance of Advair and Spiriva. She has chills, mild increase in LE edema. Denies fevers, vision changes, hemoptysis, change in baseline exertional SOB or orthopnea, PND, nausea, vomiting, abdominal pain, diarrhea, dysuria, hematuria, rash, bleeding/bruising, edema, arthralgias, myalgias, LE pain, paresthesias, weakness, LH/dizziness. No history of clots.  She was last  hospitalized 01/27/2014 to 02/02/2014 for acute on chronic respiratory failure with hypercapnia. She was treated with BiPAP, scheduled Duoneb tx, and IV solumedrol. She received IV Levaquin x 6 days and prednisone taper x 8 days.  She was seen in clinic 02/10/2014 for hospital follow up. At that time, she felt back to baseline in terms of her respiratory status. Also noted to be noncompliant on lasix  Hospital Course by problem list: Principal Problem:   Acute on chronic respiratory failure with hypercapnia Active Problems:   COPD exacerbation   Acute on chronic diastolic CHF (congestive heart failure)   Acute on chronic respiratory failure with hypercapnia: Likely 2/2 acute on chronic diastolic CHF with possible COPD exacerbation given that she reported not taking her home Lasix on admission as it made her urinate frequently. She was initially placed on BiPAP and her home COPD meds were continued; she was transitioned off once her ABG showed improved gases (CO2 96->78). At the time of discharge, she could tolerate 4L O2 and was prescribed a prednisone taper (15m x 3 days, 293mx 3 days, 10 mg x 3  days) as well as azithromycin to complete a total 5-day course of antibiotics. She was also asked to resume Lasix 6063mD; her discharge weight was 251 lbs (net -3.4L).  Hyperlipidemia: Remained stable on home medications.   Discharge Vitals:   BP 126/77 mmHg  Pulse 83  Temp(Src) 98.9 F (37.2 C) (Oral)  Resp 20  Ht _0  (1.651 m)  Wt 251 lb (113.853 kg)  BMI 41.77 kg/m2  SpO2 87%  Discharge Labs:  Results for orders placed or performed during the hospital encounter of 02/22/14 (from the past 24 hour(s))  I-STAT 3, arterial blood gas (G3+)     Status: Abnormal   Collection Time: 02/24/14 11:50 AM  Result Value Ref Range   pH, Arterial 7.419 7.350 - 7.450   pCO2 arterial 78.1 (HH) 35.0 - 45.0 mmHg   pO2, Arterial 50.0 (L) 80.0 - 100.0 mmHg   Bicarbonate 50.5 (H) 20.0 - 24.0 mEq/L   TCO2 >50 0 - 100 mmol/L   O2 Saturation 83.0 %   Acid-Base Excess 20.0 (H) 0.0 - 2.0 mmol/L   Patient temperature 98.6 F    Collection site RADIAL, ALLEN'S TEST ACCEPTABLE    Sample type ARTERIAL    Comment NOTIFIED PHYSICIAN     Signed: RusCharlott RakesD 02/24/2014, 1:49 PM    Services Ordered on Discharge: None Equipment Ordered on Discharge: None

## 2014-02-24 NOTE — Progress Notes (Signed)
DC instructions given to pt at this time re: f/u appts, home meds, activity, s/s of problems to report to md.  Pt verbalized understanding of all instructions.  No s/s of any acute distress noted.  Pt waiting on ride

## 2014-02-24 NOTE — Progress Notes (Signed)
Pt seen and examined with Dr. Posey Pronto. Case d/w residents in detail. Please refer to resident note for details  Pt feels well today. Tolerated PO. Is off BIPAP and has no new complaints   Physical exam: Gen: AAO*3, NAD CVS: RRR, normal heart sounds Pulm: diminished air entry b/l but CTA b/l Abd: soft, non tender, BS + Ext: b/l LE edema improving - now trace  Assessment and Plan: 58 y/o female with likely acute COPD exacerbation  Acute COPD exacerbation: - Repeat ABG noted. Resp acidosis now resolved - Pt is currently asymptomatic and feels well - c/w dulera and nebs - c/w azithromycin. Resume home meds - c/w O2 via [place]  Mild Acute on chronic diastolic HF exacerbation: - c/w lasix - Pt educated about importance of medication compliance - daily weights, I's and O's - No further w/u for now  Pt stable for d/c home today

## 2014-02-24 NOTE — Discharge Instructions (Signed)
Thank you for trusting [place] with your medical care!  You were hospitalized for COPD exacerbation and treated with BiPAP, Lasix, prednisone.   Please pick up these medications from the pharmacy, and continue taking this medications at home. -Prednisone: 4 tablets (Tuesday-Thursday), 2 tablets (Friday-Sunday), 1 tablet (Monday-Wednesday) -Azithromycin: 1 tablet (Tuesday-Thursday)  Please continue taking Lasix at home as well to help keep stress off your lungs.

## 2014-03-03 ENCOUNTER — Encounter: Payer: Self-pay | Admitting: Internal Medicine

## 2014-03-03 ENCOUNTER — Ambulatory Visit: Payer: Medicare HMO | Admitting: Pulmonary Disease

## 2014-03-11 ENCOUNTER — Other Ambulatory Visit: Payer: Self-pay | Admitting: *Deleted

## 2014-03-11 MED ORDER — ALBUTEROL SULFATE HFA 108 (90 BASE) MCG/ACT IN AERS: 2.0000 | INHALATION_SPRAY | Freq: Three times a day (TID) | RESPIRATORY_TRACT | Status: DC | PRN

## 2014-03-13 ENCOUNTER — Encounter (HOSPITAL_COMMUNITY): Payer: Self-pay | Admitting: Cardiology

## 2014-03-18 ENCOUNTER — Observation Stay (HOSPITAL_COMMUNITY)
Admission: EM | Admit: 2014-03-18 | Discharge: 2014-03-20 | Disposition: A | Discharge status: Home / Self Care | Payer: Commercial Managed Care - HMO | Attending: Internal Medicine | Admitting: Internal Medicine

## 2014-03-18 ENCOUNTER — Encounter (HOSPITAL_COMMUNITY): Payer: Self-pay | Admitting: Physical Medicine and Rehabilitation

## 2014-03-18 ENCOUNTER — Emergency Department (HOSPITAL_COMMUNITY): Payer: Commercial Managed Care - HMO

## 2014-03-18 DIAGNOSIS — I5030 Unspecified diastolic (congestive) heart failure: Secondary | ICD-10-CM | POA: Diagnosis present

## 2014-03-18 DIAGNOSIS — E785 Hyperlipidemia, unspecified: Secondary | ICD-10-CM | POA: Insufficient documentation

## 2014-03-18 DIAGNOSIS — K219 Gastro-esophageal reflux disease without esophagitis: Secondary | ICD-10-CM | POA: Diagnosis not present

## 2014-03-18 DIAGNOSIS — I272 Other secondary pulmonary hypertension: Secondary | ICD-10-CM | POA: Insufficient documentation

## 2014-03-18 DIAGNOSIS — I2510 Atherosclerotic heart disease of native coronary artery without angina pectoris: Secondary | ICD-10-CM | POA: Insufficient documentation

## 2014-03-18 DIAGNOSIS — E669 Obesity, unspecified: Secondary | ICD-10-CM | POA: Insufficient documentation

## 2014-03-18 DIAGNOSIS — F17210 Nicotine dependence, cigarettes, uncomplicated: Secondary | ICD-10-CM | POA: Diagnosis not present

## 2014-03-18 DIAGNOSIS — I509 Heart failure, unspecified: Secondary | ICD-10-CM | POA: Diagnosis not present

## 2014-03-18 DIAGNOSIS — Z9981 Dependence on supplemental oxygen: Secondary | ICD-10-CM | POA: Insufficient documentation

## 2014-03-18 DIAGNOSIS — Z79899 Other long term (current) drug therapy: Secondary | ICD-10-CM | POA: Diagnosis not present

## 2014-03-18 DIAGNOSIS — J9620 Acute and chronic respiratory failure, unspecified whether with hypoxia or hypercapnia: Secondary | ICD-10-CM | POA: Diagnosis present

## 2014-03-18 DIAGNOSIS — J441 Chronic obstructive pulmonary disease with (acute) exacerbation: Principal | ICD-10-CM | POA: Insufficient documentation

## 2014-03-18 DIAGNOSIS — Z720 Tobacco use: Secondary | ICD-10-CM | POA: Diagnosis present

## 2014-03-18 DIAGNOSIS — Z6841 Body Mass Index (BMI) 40.0 and over, adult: Secondary | ICD-10-CM

## 2014-03-18 DIAGNOSIS — R0602 Shortness of breath: Secondary | ICD-10-CM

## 2014-03-18 LAB — CBC WITH DIFFERENTIAL/PLATELET
Basophils Absolute: 0 10*3/uL (ref 0.0–0.1)
Basophils Relative: 0 % (ref 0–1)
EOS ABS: 0.1 10*3/uL (ref 0.0–0.7)
Eosinophils Relative: 2 % (ref 0–5)
HEMATOCRIT: 44 % (ref 36.0–46.0)
HEMOGLOBIN: 13.4 g/dL (ref 12.0–15.0)
Lymphocytes Relative: 24 % (ref 12–46)
Lymphs Abs: 1.6 10*3/uL (ref 0.7–4.0)
MCH: 29.7 pg (ref 26.0–34.0)
MCHC: 30.5 g/dL (ref 30.0–36.0)
MCV: 97.6 fL (ref 78.0–100.0)
Monocytes Absolute: 0.5 10*3/uL (ref 0.1–1.0)
Monocytes Relative: 8 % (ref 3–12)
NEUTROS PCT: 66 % (ref 43–77)
Neutro Abs: 4.5 10*3/uL (ref 1.7–7.7)
Platelets: 137 10*3/uL — ABNORMAL LOW (ref 150–400)
RBC: 4.51 MIL/uL (ref 3.87–5.11)
RDW: 14.1 % (ref 11.5–15.5)
WBC: 6.8 10*3/uL (ref 4.0–10.5)

## 2014-03-18 LAB — COMPREHENSIVE METABOLIC PANEL
ALT: 9 U/L (ref 0–35)
ANION GAP: 8 (ref 5–15)
AST: 12 U/L (ref 0–37)
Albumin: 3.3 g/dL — ABNORMAL LOW (ref 3.5–5.2)
Alkaline Phosphatase: 113 U/L (ref 39–117)
BILIRUBIN TOTAL: 0.4 mg/dL (ref 0.3–1.2)
BUN: 8 mg/dL (ref 6–23)
CHLORIDE: 98 meq/L (ref 96–112)
CO2: 37 mEq/L — ABNORMAL HIGH (ref 19–32)
Calcium: 9.2 mg/dL (ref 8.4–10.5)
Creatinine, Ser: 0.65 mg/dL (ref 0.50–1.10)
GFR calc non Af Amer: >90 mL/min (ref >90)
GLUCOSE: 103 mg/dL — AB (ref 70–99)
POTASSIUM: 4.5 meq/L (ref 3.7–5.3)
Sodium: 143 mEq/L (ref 137–147)
TOTAL PROTEIN: 6.5 g/dL (ref 6.0–8.3)

## 2014-03-18 LAB — I-STAT TROPONIN, ED: Troponin i, poc: 0.01 ng/mL (ref 0.00–0.08)

## 2014-03-18 LAB — PRO B NATRIURETIC PEPTIDE: Pro B Natriuretic peptide (BNP): 400.4 pg/mL — ABNORMAL HIGH (ref 0–125)

## 2014-03-18 MED ORDER — METHYLPREDNISOLONE SODIUM SUCC 125 MG IJ SOLR
125.0000 mg | Freq: Once | INTRAMUSCULAR | Status: AC
  Administered 2014-03-18: 125 mg via INTRAVENOUS
  Filled 2014-03-18: qty 2

## 2014-03-18 MED ORDER — IPRATROPIUM BROMIDE 0.02 % IN SOLN
1.0000 mg | Freq: Once | RESPIRATORY_TRACT | Status: AC
  Administered 2014-03-18: 1 mg via RESPIRATORY_TRACT
  Filled 2014-03-18: qty 5

## 2014-03-18 MED ORDER — ALBUTEROL (5 MG/ML) CONTINUOUS INHALATION SOLN
10.0000 mg/h | INHALATION_SOLUTION | Freq: Once | RESPIRATORY_TRACT | Status: AC
  Administered 2014-03-18: 10 mg/h via RESPIRATORY_TRACT
  Filled 2014-03-18: qty 20

## 2014-03-18 NOTE — ED Notes (Signed)
Pt presents to department for evaluation of SOB. History of COPD, wears 4L O2 at home, states breathing became worse today. Denies chest pain. Pt is alert and oriented x4.

## 2014-03-18 NOTE — ED Provider Notes (Signed)
CSN: [id number]     Arrival date & time 03/18/14  1801 History   First MD Initiated Contact with Patient 03/18/14 1947     Chief Complaint  Patient presents with  . Shortness of Breath     HPI Pt was seen at 2010.  Per pt, c/o gradual onset and worsening of persistent cough, wheezing and SOB that began earlier today.  Describes her symptoms as "my COPD is acting up."  Has been using home MDI, nebs, and home O2 N/C 4L continuously without relief.  Denies CP/palpitations, no back pain, no abd pain, no N/V/D, no fevers, no rash.     Past Medical History  Diagnosis Date  . Coronary artery disease     S/P PCI of LAD with DES (12/2008). Total occlusion of RCA noted at that time., medically managed. ACS ruled out 03/2009 with Lexiscan myoview . Followed by [facility].  . Pulmonary hypertension     2-D Echo (30/1601) - Systolic pressure was moderately increased. PA peak pressure  18mHg. 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 Followed by Dr. WJoya Gaskins(Velora Heckler  . Diastolic dysfunction     2-D Echo (12/2008) - Normal LV Systolic funciton with EF 60-65%. Grade 1 diastolid dysfunction. No regional wall motion abnormalities. Moderate pulmonary HTN with PA peak pressure 580mg.  . Marland KitchenOPD (chronic obstructive pulmonary disease)     Severe. Gold Stage IV.  PFTs (12/2008) - severe obstructive airway disease. Active tobacco use. Requires 4L O2 at home.  . Pulmonary nodule, right     Small right middle lobe nodule. Stable as of 12/2008.  . Marland Kitchenleep apnea     Presumptive. No documented sleep studies.   . Prediabetes 12/2008    HgbA1c 6.4 (12/2008)  . Hx MRSA infection     Recurrent MRSA thigh abscesses.  . Tobacco abuse     Ongoing.  . Obesity   . Hyperlipidemia   . GERD (gastroesophageal reflux disease)     S/P Nissen fundoplication.  . Shortness of breath   . CHF (congestive heart failure)     Past Surgical History  Procedure Laterality Date  . Total abdominal hysterectomy w/ bilateral salpingoophorectomy    . Nissen fundoplication    . Right heart catheterization N/A 11/09/2012    Procedure: RIGHT HEART CATH;  Surgeon: DaLarey DresserMD;  [facility];  Service: Cardiovascular;  Laterality: N/A;   Family History  Problem Relation Age of Onset  . Heart disease Mother 4718  Deceased from MI at 476yo. Hypertension Mother   . Heart disease Father 5426  Deceased of MI age 59yo. Hypertension Father   . Hypertension Brother   . Lung cancer      Grandmother   History  Substance Use Topics  . Smoking status: Current Every Day Smoker -- 0.50 packs/day for 40 years    Types: Cigarettes  . Smokeless tobacco: Never Used     Comment: 2 per day  . Alcohol Use: No    Review of Systems ROS: Statement: All systems negative except as marked or noted in the HPI; Constitutional: Negative for fever and chills. ; ; Eyes: Negative for eye pain, redness and discharge. ; ; ENMT: Negative for ear pain, hoarseness, nasal congestion, sinus pressure and sore throat. ; ; Cardiovascular: Negative for chest pain, palpitations, diaphoresis, and peripheral edema. ; ; Respiratory: +cough,  wheezing, SOB. Negative for stridor. ; ; Gastrointestinal: Negative for nausea, vomiting, diarrhea, abdominal pain, blood in stool, hematemesis, jaundice and rectal bleeding. . ; ; Genitourinary: Negative for dysuria, flank pain and hematuria. ; ; Musculoskeletal: Negative for back pain and neck pain. Negative for swelling and trauma.; ; Skin: Negative for pruritus, rash, abrasions, blisters, bruising and skin lesion.; ; Neuro: Negative for headache, lightheadedness and neck stiffness. Negative for weakness, altered level of consciousness , altered mental status, extremity weakness, paresthesias, involuntary movement, seizure and syncope.      Allergies  Fluconazole  Home Medications   Prior to  Admission medications   Medication Sig Start Date End Date Taking? Authorizing Provider  ADVAIR DISKUS 250-50 MCG/DOSE AEPB Inhale 1 puff into the lungs daily. 03/04/14  Yes Historical Provider, MD  albuterol (PROVENTIL HFA;VENTOLIN HFA) 108 (90 BASE) MCG/ACT inhaler Inhale 2 puffs into the lungs 3 (three) times daily as needed for wheezing or shortness of breath. 03/11/14  Yes Jessee Avers, MD  Aspirin-Salicylamide-Caffeine (BC HEADACHE POWDER PO) Take 1 Package by mouth as needed (for headache).   Yes Historical Provider, MD  furosemide (LASIX) 20 MG tablet Take 40 mg by mouth 2 (two) times daily as needed for fluid or edema.    Yes Historical Provider, MD  lovastatin (MEVACOR) 20 MG tablet Take 1 tablet (20 mg total) by mouth at bedtime. 06/12/13  Yes Jessee Avers, MD  SYMBICORT 160-4.5 MCG/ACT inhaler Inhale 1 puff into the lungs daily. 03/04/14  Yes Historical Provider, MD  tiotropium (SPIRIVA HANDIHALER) 18 MCG inhalation capsule Place 1 capsule (18 mcg total) into inhaler and inhale daily. 06/12/13  Yes Jessee Avers, MD  albuterol (PROVENTIL) (2.5 MG/3ML) 0.083% nebulizer solution Take 2.5 mg by nebulization daily as needed for wheezing or shortness of breath.    Historical Provider, MD  azithromycin (ZITHROMAX Z-PAK) 250 MG tablet Take 1 tablet per day until you run out. 02/24/14   Charlott Rakes, MD  predniSONE (DELTASONE) 10 MG tablet Take 4 tablets for three days, 2 tablets for three days, and 1 tablet for three days. 02/24/14   Charlott Rakes, MD   BP 145/76 mmHg  Pulse 87  Temp(Src) 98.4 F (36.9 C) (Oral)  Resp 22  Ht _0  (1.651 m)  Wt 250 lb (113.399 kg)  BMI 41.60 kg/m2  SpO2 92% Physical Exam  2015; Physical examination:  Nursing notes reviewed; Vital signs and O2 SAT reviewed;  Constitutional: Well developed, Well nourished, Well hydrated, Uncomfortable appearing; Head:  Normocephalic, atraumatic; Eyes: EOMI, PERRL, No scleral icterus; ENMT: Mouth and pharynx normal,  Mucous membranes moist; Neck: Supple, Full range of motion, No lymphadenopathy; Cardiovascular: Regular rate and rhythm, No gallop; Respiratory: Breath sounds diminished & equal bilaterally, faint scattered wheezes. No audible wheezing. Speaking long phrases, tachypneic.; Chest: Nontender, Movement normal; Abdomen: Soft, Nontender, Nondistended, Normal bowel sounds; Genitourinary: No CVA tenderness; Extremities: Pulses normal, No tenderness, No edema, No calf edema or asymmetry.; Neuro: AA&Ox3, Major CN grossly intact.  Speech clear. No gross focal motor or sensory deficits in extremities.; Skin: Color normal, Warm, Dry.   ED Course  Procedures     EKG Interpretation   Date/Time:  Tuesday March 18 2014 18:38:01 EST Ventricular Rate:  78 PR Interval:  140 QRS Duration: 86 QT Interval:  400 QTC Calculation: 456 R Axis:   90 Text Interpretation:  Normal sinus rhythm Rightward axis RSR' or QR  pattern in V1 suggests right ventricular conduction delay Borderline ECG  When compared with ECG  of 02/22/2014 No significant change was found  Confirmed by [facility]  MD, Nunzio Cory [id number]) on 03/18/2014 9:35:16 PM      MDM  MDM Reviewed: previous chart, nursing note and vitals Reviewed previous: labs and ECG Interpretation: labs, ECG and x-ray Total time providing critical care: 30-74 minutes. This excludes time spent performing separately reportable procedures and services. Consults: admitting MD   CRITICAL CARE Performed by: Alfonzo Feller Total critical care time: 35 Critical care time was exclusive of separately billable procedures and treating other patients. Critical care was necessary to treat or prevent imminent or life-threatening deterioration. Critical care was time spent personally by me on the following activities: development of treatment plan with patient and/or surrogate as well as nursing, discussions with consultants, evaluation of patient's response to treatment,  examination of patient, obtaining history from patient or surrogate, ordering and performing treatments and interventions, ordering and review of laboratory studies, ordering and review of radiographic studies, pulse oximetry and re-evaluation of patient's condition.   Results for orders placed or performed during the hospital encounter of 03/18/14  CBC with Differential  Result Value Ref Range   WBC 6.8 4.0 - 10.5 K/uL   RBC 4.51 3.87 - 5.11 MIL/uL   Hemoglobin 13.4 12.0 - 15.0 g/dL   HCT 44.0 36.0 - 46.0 %   MCV 97.6 78.0 - 100.0 fL   MCH 29.7 26.0 - 34.0 pg   MCHC 30.5 30.0 - 36.0 g/dL   RDW 14.1 11.5 - 15.5 %   Platelets 137 (L) 150 - 400 K/uL   Neutrophils Relative % 66 43 - 77 %   Neutro Abs 4.5 1.7 - 7.7 K/uL   Lymphocytes Relative 24 12 - 46 %   Lymphs Abs 1.6 0.7 - 4.0 K/uL   Monocytes Relative 8 3 - 12 %   Monocytes Absolute 0.5 0.1 - 1.0 K/uL   Eosinophils Relative 2 0 - 5 %   Eosinophils Absolute 0.1 0.0 - 0.7 K/uL   Basophils Relative 0 0 - 1 %   Basophils Absolute 0.0 0.0 - 0.1 K/uL  Comprehensive metabolic panel  Result Value Ref Range   Sodium 143 137 - 147 mEq/L   Potassium 4.5 3.7 - 5.3 mEq/L   Chloride 98 96 - 112 mEq/L   CO2 37 (H) 19 - 32 mEq/L   Glucose, Bld 103 (H) 70 - 99 mg/dL   BUN 8 6 - 23 mg/dL   Creatinine, Ser 0.65 0.50 - 1.10 mg/dL   Calcium 9.2 8.4 - 10.5 mg/dL   Total Protein 6.5 6.0 - 8.3 g/dL   Albumin 3.3 (L) 3.5 - 5.2 g/dL   AST 12 0 - 37 U/L   ALT 9 0 - 35 U/L   Alkaline Phosphatase 113 39 - 117 U/L   Total Bilirubin 0.4 0.3 - 1.2 mg/dL   GFR calc non Af Amer >90 >90 mL/min   GFR calc Af Amer >90 >90 mL/min   Anion gap 8 5 - 15  Pro b natriuretic peptide  Result Value Ref Range   Pro B Natriuretic peptide (BNP) 400.4 (H) 0 - 125 pg/mL  I-Stat Troponin, ED (not at [facility])  Result Value Ref Range   Troponin i, poc 0.01 0.00 - 0.08 ng/mL   Comment 3           Dg Chest 2 View 03/18/2014   CLINICAL DATA:  Shortness of breath.  EXAM:  CHEST  2 VIEW  COMPARISON:  February 22, 2014.  FINDINGS:  Stable cardiomegaly. Stable mild central pulmonary vascular congestion is noted. No pneumothorax or significant pleural effusion is noted. Stable bibasilar interstitial densities are noted most consistent with scarring, but superimposed pulmonary edema cannot be excluded. Bony thorax appears intact.  IMPRESSION: Stable bibasilar interstitial densities are noted most consistent with scarring, but superimposed pulmonary edema cannot be excluded. Stable mild central pulmonary vascular congestion is noted.   Electronically Signed   By: Sabino Dick M.D.   On: 03/18/2014 20:24    2345:  On arrival to ED: pt with decreased lung sounds, tachypneic. Hour long neb and IV solumedrol given. After neb completed: lungs coarse bilat, no wheezing, Sats 93-96% on O2 4L N/C at rest. Pt ambulated with O2 Sats dropping to 78% while on her usual O2 4L N/C, and pt c/o increasing SOB with increasing RR. Pt escorted back to stretcher. Dx and testing d/w pt.  Questions answered.  Verb understanding, agreeable to admit. T/C to [facility] Resident, case discussed, including:  HPI, pertinent PM/SHx, VS/PE, dx testing, ED course and treatment:  Agreeable to admit, requests to write temporary orders, obtain tele bed to Dr. Wilber Bihari service.   Francine Graven, DO 03/21/14 1352

## 2014-03-18 NOTE — ED Notes (Signed)
Pt O2 sats dropped to 78% while ambulating on 4L O2 via [place].

## 2014-03-19 ENCOUNTER — Other Ambulatory Visit: Payer: Self-pay

## 2014-03-19 DIAGNOSIS — I272 Other secondary pulmonary hypertension: Secondary | ICD-10-CM | POA: Diagnosis not present

## 2014-03-19 DIAGNOSIS — F17210 Nicotine dependence, cigarettes, uncomplicated: Secondary | ICD-10-CM | POA: Diagnosis not present

## 2014-03-19 DIAGNOSIS — J441 Chronic obstructive pulmonary disease with (acute) exacerbation: Secondary | ICD-10-CM | POA: Diagnosis present

## 2014-03-19 DIAGNOSIS — Z9981 Dependence on supplemental oxygen: Secondary | ICD-10-CM | POA: Diagnosis not present

## 2014-03-19 LAB — TROPONIN I: Troponin I: <0.3 ng/mL (ref <0.30)

## 2014-03-19 MED ORDER — PREDNISONE 20 MG PO TABS
40.0000 mg | ORAL_TABLET | Freq: Every day | ORAL | Status: DC
  Administered 2014-03-19 – 2014-03-20 (×2): 40 mg via ORAL
  Filled 2014-03-19 (×3): qty 2

## 2014-03-19 MED ORDER — ACETAMINOPHEN 325 MG PO TABS
650.0000 mg | ORAL_TABLET | Freq: Four times a day (QID) | ORAL | Status: DC | PRN
  Administered 2014-03-19: 650 mg via ORAL
  Filled 2014-03-19: qty 2

## 2014-03-19 MED ORDER — DOXYCYCLINE HYCLATE 100 MG PO TABS: 100.0000 mg | ORAL_TABLET | Freq: Two times a day (BID) | ORAL | Status: AC

## 2014-03-19 MED ORDER — PREDNISONE 20 MG PO TABS: 40.0000 mg | ORAL_TABLET | Freq: Every day | ORAL | Status: DC

## 2014-03-19 MED ORDER — IPRATROPIUM-ALBUTEROL 0.5-2.5 (3) MG/3ML IN SOLN
3.0000 mL | RESPIRATORY_TRACT | Status: DC
  Filled 2014-03-19: qty 3

## 2014-03-19 MED ORDER — IPRATROPIUM-ALBUTEROL 0.5-2.5 (3) MG/3ML IN SOLN
3.0000 mL | RESPIRATORY_TRACT | Status: DC
  Administered 2014-03-19 (×2): 3 mL via RESPIRATORY_TRACT
  Filled 2014-03-19 (×2): qty 3

## 2014-03-19 MED ORDER — ALBUTEROL SULFATE (2.5 MG/3ML) 0.083% IN NEBU
2.5000 mg | INHALATION_SOLUTION | RESPIRATORY_TRACT | Status: AC | PRN
  Filled 2014-03-19: qty 3

## 2014-03-19 MED ORDER — PRAVASTATIN SODIUM 20 MG PO TABS
20.0000 mg | ORAL_TABLET | Freq: Every day | ORAL | Status: DC
  Administered 2014-03-19 – 2014-03-20 (×2): 20 mg via ORAL
  Filled 2014-03-19 (×2): qty 1

## 2014-03-19 MED ORDER — GUAIFENESIN ER 600 MG PO TB12
600.0000 mg | ORAL_TABLET | Freq: Two times a day (BID) | ORAL | Status: DC
  Administered 2014-03-19 – 2014-03-20 (×4): 600 mg via ORAL
  Filled 2014-03-19 (×5): qty 1

## 2014-03-19 MED ORDER — IBUPROFEN 800 MG PO TABS
800.0000 mg | ORAL_TABLET | Freq: Once | ORAL | Status: AC
  Administered 2014-03-19: 800 mg via ORAL
  Filled 2014-03-19: qty 1

## 2014-03-19 MED ORDER — DOXYCYCLINE HYCLATE 100 MG PO TABS
100.0000 mg | ORAL_TABLET | Freq: Two times a day (BID) | ORAL | Status: DC
  Administered 2014-03-19 – 2014-03-20 (×4): 100 mg via ORAL
  Filled 2014-03-19 (×5): qty 1

## 2014-03-19 MED ORDER — NICOTINE 14 MG/24HR TD PT24
14.0000 mg | MEDICATED_PATCH | Freq: Every day | TRANSDERMAL | Status: DC
  Administered 2014-03-19 – 2014-03-20 (×2): 14 mg via TRANSDERMAL
  Filled 2014-03-19 (×2): qty 1

## 2014-03-19 MED ORDER — IPRATROPIUM-ALBUTEROL 0.5-2.5 (3) MG/3ML IN SOLN
3.0000 mL | RESPIRATORY_TRACT | Status: DC | PRN
  Administered 2014-03-19: 3 mL via RESPIRATORY_TRACT

## 2014-03-19 MED ORDER — NICOTINE 14 MG/24HR TD PT24: 14.0000 mg | MEDICATED_PATCH | Freq: Every day | TRANSDERMAL | Status: DC

## 2014-03-19 MED ORDER — ENOXAPARIN SODIUM 40 MG/0.4ML ~~LOC~~ SOLN
40.0000 mg | SUBCUTANEOUS | Status: DC
  Administered 2014-03-19 – 2014-03-20 (×2): 40 mg via SUBCUTANEOUS
  Filled 2014-03-19 (×2): qty 0.4

## 2014-03-19 MED ORDER — FUROSEMIDE 40 MG PO TABS
40.0000 mg | ORAL_TABLET | Freq: Two times a day (BID) | ORAL | Status: DC | PRN
  Filled 2014-03-19: qty 1

## 2014-03-19 NOTE — Discharge Instructions (Signed)
Please follow-up with your primary care provider at the internal medicine clinic (Dr. Alice Rieger) on December 29 at 2:15 PM. Please continue taking your prednisone as prescribed. Please continue taking your doxycycline as prescribed.   Chronic Obstructive Pulmonary Disease Exacerbation  Chronic obstructive pulmonary disease (COPD) is a common lung problem. In COPD, the flow of air from the lungs is limited. COPD exacerbations are times that breathing gets worse and you need extra treatment. Without treatment they can be life threatening. If they happen often, your lungs can become more damaged. HOME CARE  Do not smoke.  Avoid tobacco smoke and other things that bother your lungs.  If given, take your antibiotic medicine as told. Finish the medicine even if you start to feel better.  Only take medicines as told by your doctor.  Drink enough fluids to keep your pee (urine) clear or pale yellow (unless your doctor has told you not to).  Use a cool mist machine (vaporizer).  If you use oxygen or a machine that turns liquid medicine into a mist (nebulizer), continue to use them as told.  Keep up with shots (vaccinations) as told by your doctor.  Exercise regularly.  Eat healthy foods.  Keep all doctor visits as told. GET HELP RIGHT AWAY IF:  You are very short of breath and it gets worse.  You have trouble talking.  You have bad chest pain.  You have blood in your spit (sputum).  You have a fever.  You keep throwing up (vomiting).  You feel weak, or you pass out (faint).  You feel confused.  You keep getting worse. MAKE SURE YOU:   Understand these instructions.  Will watch your condition.  Will get help right away if you are not doing well or get worse. Document Released: 03/10/2011 Document Revised: 01/09/2013 Document Reviewed: 11/23/2012 [facility] Patient Information 2015 [place], [place]. This information is not intended to replace advice given to you by your health  care provider. Make sure you discuss any questions you have with your health care provider.

## 2014-03-19 NOTE — Progress Notes (Signed)
Subjective:  Patient reporting she is feeling much better today, almost at her baseline.   Objective: Vital signs in last 24 hours: Filed Vitals:   03/19/14 0145 03/19/14 0407 03/19/14 0500 03/19/14 0856  BP: 117/75  110/41   Pulse: 90 90 90   Temp: 97.7 F (36.5 C)  98.1 F (36.7 C)   TempSrc: Oral  Oral   Resp: _0 Height: 5' 5" (1.651 m)     Weight: 255 lb 11.7 oz (116 kg)     SpO2: 92% 91% 92% 92%   Weight change:   Intake/Output Summary (Last 24 hours) at 03/19/14 1128 Last data filed at 03/19/14 1000  Gross per 24 hour  Intake    960 ml  Output    450 ml  Net    510 ml   General: alert, sitting up in bed, NAD, breathing comfortably   HEENT: [place]/AT, EOMI, sclera anicteric, mucus membranes moist CV: RRR, normal S1/S2, no m/g/r Pulm: CTA bilaterally, no wheezing present, appears comfortable on 5 L oxygen via [place] Abd: BS+, obese, non-tender Ext: warm, moves all, 2+ edema in bilateral lower extremities Neuro: alert and oriented x 3, CNs II-XII intact, strength 5/5 in upper and lower extremities bilaterally  Lab Results: Basic Metabolic Panel:  Recent Labs Lab 03/18/14 1839  NA 143  K 4.5  CL 98  CO2 37*  GLUCOSE 103*  BUN 8  CREATININE 0.65  CALCIUM 9.2   Liver Function Tests:  Recent Labs Lab 03/18/14 1839  AST 12  ALT 9  ALKPHOS 113  BILITOT 0.4  PROT 6.5  ALBUMIN 3.3*   No results for input(s): LIPASE, AMYLASE in the last 168 hours. No results for input(s): AMMONIA in the last 168 hours. CBC:  Recent Labs Lab 03/18/14 1839  WBC 6.8  NEUTROABS 4.5  HGB 13.4  HCT 44.0  MCV 97.6  PLT 137*   Cardiac Enzymes:  Recent Labs Lab 03/19/14 0402  TROPONINI <0.30   BNP:  Recent Labs Lab 03/18/14 1840  PROBNP 400.4*   D-Dimer: No results for input(s): DDIMER in the last 168 hours. CBG: No results for input(s): GLUCAP in the last 168 hours. Hemoglobin A1C: No results for input(s): HGBA1C in the last 168 hours. Fasting  Lipid Panel: No results for input(s): CHOL, HDL, LDLCALC, TRIG, CHOLHDL, LDLDIRECT in the last 168 hours. Thyroid Function Tests: No results for input(s): TSH, T4TOTAL, FREET4, T3FREE, THYROIDAB in the last 168 hours. Coagulation: No results for input(s): LABPROT, INR in the last 168 hours. Anemia Panel: No results for input(s): VITAMINB12, FOLATE, FERRITIN, TIBC, IRON, RETICCTPCT in the last 168 hours. Urine Drug Screen: Drugs of Abuse     Component Value Date/Time   LABOPIA NONE DETECTED 07/01/2013 1218   LABOPIA NEGATIVE 02/04/2009 2315   COCAINSCRNUR NONE DETECTED 07/01/2013 1218   COCAINSCRNUR NEGATIVE 02/04/2009 2315   LABBENZ NONE DETECTED 07/01/2013 1218   LABBENZ NEGATIVE 02/04/2009 2315   AMPHETMU NONE DETECTED 07/01/2013 1218   AMPHETMU NEGATIVE 02/04/2009 2315   THCU NONE DETECTED 07/01/2013 1218   LABBARB NONE DETECTED 07/01/2013 1218    Alcohol Level: No results for input(s): ETH in the last 168 hours. Urinalysis: No results for input(s): COLORURINE, LABSPEC, PHURINE, GLUCOSEU, HGBUR, BILIRUBINUR, KETONESUR, PROTEINUR, UROBILINOGEN, NITRITE, LEUKOCYTESUR in the last 168 hours.  Invalid input(s): APPERANCEUR Micro Results: No results found for this or any previous visit (from the past 240 hour(s)). Studies/Results: Dg Chest 2 View  03/18/2014   CLINICAL  DATA:  Shortness of breath.  EXAM: CHEST  2 VIEW  COMPARISON:  February 22, 2014.  FINDINGS: Stable cardiomegaly. Stable mild central pulmonary vascular congestion is noted. No pneumothorax or significant pleural effusion is noted. Stable bibasilar interstitial densities are noted most consistent with scarring, but superimposed pulmonary edema cannot be excluded. Bony thorax appears intact.  IMPRESSION: Stable bibasilar interstitial densities are noted most consistent with scarring, but superimposed pulmonary edema cannot be excluded. Stable mild central pulmonary vascular congestion is noted.   Electronically Signed    By: Sabino Dick M.D.   On: 03/18/2014 20:24   Medications: I have reviewed the patient's current medications. Scheduled Meds: . doxycycline  100 mg Oral Q12H  . enoxaparin (LOVENOX) injection  40 mg Subcutaneous Q24H  . guaiFENesin  600 mg Oral BID  . nicotine  14 mg Transdermal Daily  . pravastatin  20 mg Oral q1800  . 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 She was 11/30 for acute on chronic hypercapnic respiratory failure. She nearly required intubation in 01/2014.  Acute COPD exacerbation: End stage COPD on 4 L at home. Increased dyspnea and cough. Sputum production has been at her baseline. CXR with no evidence of pneumonia but possible edema.  -recommend follow up with pulmonology as an outpatient -continue doxycycline 100 mg BID as outpatient (start date 03/18/14) -maintain O2 sats between 88-92% -prednisone 40 mg PO -duonebs q 4 hours prn -albuterol -case management to help patient with affordability regarding outpatient medications  Tobacco abuse: -counseled on smoking cessation -nicotine patch  Diastolic CHF: Last echo 7/0/9295 - EF 55-60%. Her weight is 255lb from 250 on 03/03/2014. She is noncompliant with her Lasix therapy at home. Upon admission, ProBNP is 400 down from 1172 on 02/22/2014. EKG without acute ischemic changes and troponins negative.   - Cont with lasix 40 mg PO daily - Daily weights, strict i/os  Thrombocytopenia: Admission platelets  of 137. Stable from prior.   Diet: Carb modified VTE PPx: Lovenox SQ Dispo: Anticipated discharge is today.   The patient does have a current PCP Jessee Avers, MD) and does need an [facility] hospital follow-up appointment after discharge.  The patient does not have transportation limitations that hinder transportation to clinic appointments.   LOS: 1 day   Luan Moore, MD 03/19/2014, 11:28 AM

## 2014-03-19 NOTE — Care Management Note (Signed)
    Page 1 of 2   03/19/2014     2:36:35 PM CARE MANAGEMENT NOTE 03/19/2014  Patient:  [name], [name]   Account Number:  [id number]  Date Initiated:  03/19/2014  Documentation initiated by:  Mariann Laster  Subjective/Objective Assessment:   SOB, COPD     Action/Plan:   CM to follow for disposition needs   Anticipated DC Date:  03/19/2014   Anticipated DC Plan:  [place]  CM consult  Medication Assistance      [facility] Choice  NA   Choice offered to / List presented to:  NA           Status of service:  Completed, signed off Medicare Important Message given?  NO (If response is "NO", the following Medicare IM given date fields will be blank) Date Medicare IM given:   Medicare IM given by:   Date Additional Medicare IM given:   Additional Medicare IM given by:    Discharge Disposition:  HOME/SELF CARE  Per UR Regulation:  Reviewed for med. necessity/level of care/duration of stay  If discussed at [place] of Stay Meetings, dates discussed:    Comments:  Inara Dike RN, BSN, MSHL, CCM  Nurse - Case Manager,  ([facility])  [phone]  03/19/2014 CM Consult:  Medication Assistance Social:  From home with son &  family.  Patient on SSD and insured with [facility] HMO. Patient receives  her SSD check on 3rd of the month. PCP:  [facility] Clinic:  Dr. Jessee Avers Patient inelligible for [facility] Program - insured CM reviewed new meds:  -  doxycycline 100 MG tablet - nicotine 14 mg/24hr patch-  predniSONE 20 MG tablet Advised Prednisone on $4.00 med list available at [facility] and [facility].  (Patient states "I never go to [facility] because I do not like to stand in line")  CM encouraged to try [facility] or [facility] if preferred. CM provided education and hard copy of $4.00 med list.  CM encouraged to allow family members to asssit with task as much as possible. CM provided additional resource hard copy of [place] but advised that  eligibility for medications would exclude insuance coverage.  Patient confirms she is enrolled with [employer] for 2016 - 2017 term.

## 2014-03-19 NOTE — Plan of Care (Signed)
Problem: Phase I Progression Outcomes Goal: O2 sats > or equal 90% or at baseline Outcome: Progressing Patient admitted to floor from [facility] ED for further treatment of shortness of breath.  She arrived via stretcher requiring 6L O2 via nasal cannula with O2 sats 93-94%.  With exertion she desats to 85% on O2.  She is alert and oriented x4.  Placed on cardiac monitor and oriented to room and floor procedures.  With rest O2 sats increased to 91% on 6L nasal cannula.  Will continue to monitor patient condition.

## 2014-03-19 NOTE — Discharge Summary (Signed)
Name: [name] MRN: [id number] DOB: Aug 14, 1955 58 y.o. PCP: Jessee Avers, MD  Date of Admission: 03/18/2014  7:36 PM Date of Discharge: 03/20/2014 Attending Physician: No att. providers found  Discharge Diagnosis: Principal Problem:   COPD with acute exacerbation Active Problems:   Obesity   TOBACCO ABUSE   Coronary atherosclerosis   Chronic diastolic CHF (congestive heart failure)   Acute on chronic respiratory failure with hypercapnia  Discharge Medications:   Medication List    STOP taking these medications        azithromycin 250 MG tablet  Commonly known as:  ZITHROMAX Z-PAK      TAKE these medications        ADVAIR DISKUS 250-50 MCG/DOSE Aepb  Generic drug:  Fluticasone-Salmeterol  Inhale 1 puff into the lungs daily.     albuterol (2.5 MG/3ML) 0.083% nebulizer solution  Commonly known as:  PROVENTIL  Take 2.5 mg by nebulization daily as needed for wheezing or shortness of breath.     albuterol 108 (90 BASE) MCG/ACT inhaler  Commonly known as:  PROVENTIL HFA;VENTOLIN HFA  Inhale 2 puffs into the lungs 3 (three) times daily as needed for wheezing or shortness of breath.     BC HEADACHE POWDER PO  Take 1 Package by mouth as needed (for headache).     doxycycline 100 MG tablet  Commonly known as:  VIBRA-TABS  Take 1 tablet (100 mg total) by mouth every 12 (twelve) hours.     DSS 100 MG Caps  Take 100 mg by mouth daily.     furosemide 20 MG tablet  Commonly known as:  LASIX  Take 40 mg by mouth 2 (two) times daily as needed for fluid or edema.     lovastatin 20 MG tablet  Commonly known as:  MEVACOR  Take 1 tablet (20 mg total) by mouth at bedtime.     nicotine 14 mg/24hr patch  Commonly known as:  NICODERM CQ - dosed in mg/24 hours  Place 1 patch (14 mg total) onto the skin daily.     polyethylene glycol packet  Commonly known as:  MIRALAX / GLYCOLAX  Take 17 g by mouth daily as needed for mild constipation.     predniSONE 20 MG  tablet  Commonly known as:  DELTASONE  Take 2 tablets (40 mg total) by mouth daily with breakfast.     SYMBICORT 160-4.5 MCG/ACT inhaler  Generic drug:  budesonide-formoterol  Inhale 1 puff into the lungs daily.     tiotropium 18 MCG inhalation capsule  Commonly known as:  SPIRIVA HANDIHALER  Place 1 capsule (18 mcg total) into inhaler and inhale daily.        Disposition and follow-up:   MorganYeng Seanna [name] was discharged from [facility] in Stable condition.  At the hospital follow up visit please address:  1.  Completion of antibiotic and steroid therapy course.  Smoking cessation.  2.  Labs / imaging needed at time of follow-up: none  3.  Pending labs/ test needing follow-up: none  Follow-up Appointments:  Dr. Alice Rieger on 04/01/14.    Discharge Instructions: Discharge Instructions    Call MD for:  difficulty breathing, headache or visual disturbances    Complete by:  As directed      Call MD for:  extreme fatigue    Complete by:  As directed      Call MD for:  hives    Complete by:  As directed  Call MD for:  persistant dizziness or light-headedness    Complete by:  As directed      Call MD for:  persistant nausea and vomiting    Complete by:  As directed      Call MD for:  redness, tenderness, or signs of infection (pain, swelling, redness, odor or green/yellow discharge around incision site)    Complete by:  As directed      Call MD for:  severe uncontrolled pain    Complete by:  As directed      Call MD for:  temperature >100.4    Complete by:  As directed      Diet - low sodium heart healthy    Complete by:  As directed      Increase activity slowly    Complete by:  As directed            Consultations:    Procedures Performed:  Dg Chest 2 View  03/18/2014   CLINICAL DATA:  Shortness of breath.  EXAM: CHEST  2 VIEW  COMPARISON:  February 22, 2014.  FINDINGS: Stable cardiomegaly. Stable mild central pulmonary vascular  congestion is noted. No pneumothorax or significant pleural effusion is noted. Stable bibasilar interstitial densities are noted most consistent with scarring, but superimposed pulmonary edema cannot be excluded. Bony thorax appears intact.  IMPRESSION: Stable bibasilar interstitial densities are noted most consistent with scarring, but superimposed pulmonary edema cannot be excluded. Stable mild central pulmonary vascular congestion is noted.   Electronically Signed   By: Sabino Dick M.D.   On: 03/18/2014 20:24   Dg Chest 2 View (if Patient Has Fever And/or Copd)  02/22/2014   CLINICAL DATA:  Shortness of breath starting today. Right back pain. Smoker. History of congestive heart failure, COPD.  EXAM: CHEST  2 VIEW  COMPARISON:  01/26/2014  FINDINGS: There is cardiomegaly. Mild hyperinflation of the lungs. Diffuse interstitial prominence could reflect interstitial edema. No confluent opacities or effusions. No acute bony abnormality.  IMPRESSION: Hyperinflation/COPD.  Cardiomegaly. Diffuse interstitial prominence, question early interstitial edema.   Electronically Signed   By: Rolm Baptise M.D.   On: 02/22/2014 19:24   Admission HPI:   [name] is a 58yo woman w/ PMHx of end-stage COPD on 4 L oxygen, pulmonary hypertension, CAD s/p stent in 2585, grade I diastolic CHF, and tobacco abuse who presents to the ED with shortness of breath. Patient states she has had gradual worsening of her persistent cough and shortness of breath for the last 2-3 days. She reports her cough has been productive of yellow sputum. She reports chills, fatigue, and "feeling shaky", but denies fevers and wheezing. She states nothing has really changed from her baseline, but she came to the hospital because she "couldn't get air in or out." She denies any sick contacts. She reports she is still smoking 5-6 cigarettes a day despite being on oxygen. She reports her breathing improved after receiving a nebulizer treatment in the ED. Of  note, patient has been hospitalized twice in the last 6 weeks for acute on chronic hypercarbic respiratory failure due to her severe COPD. She also has a history of medication noncompliance.   In the ED, patient was saturating at 96% on 4 L oxygen and then desaturated to 87% upon ambulation. A CXR was done which showed no evidence of pneumonia, only stable mild central pulmonary vascular congestion.   Hospital Course by problem list: Principal Problem:   COPD with acute exacerbation Active Problems:  Obesity   TOBACCO ABUSE   Coronary atherosclerosis   Chronic diastolic CHF (congestive heart failure)   Acute on chronic respiratory failure with hypercapnia   Acute COPD exacerbation: Patient initially presenting with increased dyspnea, cough, and wheezing consistent with a acute COPD exacerbation. Patient was afebrile with no leukocytosis. Chest x-ray was clear without any evidence of pneumonia. EKG and troponins were unremarkable for any cardiac etiologies. Although patient felt better after several nebulizer treatments in the emergency department, patient desaturated into the upper 70s on ambulation, and therefore she was admitted to our service. Patient quickly weaned back down to her home levels of oxygen, 4 L, and was reporting that she was feeling back to her baseline. However, patient continued to desaturate down to the mid 80s with ambulation over 2 days. Given her lack of improvement, a d-dimer was tested to evaluate for pulmonary embolism, which was negative. Patient was discharged home with a five-day course total of prednisone 40 mg daily as well as continuing patient's home nebulizers and doxycycline 100 mg twice a day also for a five-day course (end date for both doxycycline and prednisone 03/23/2014). We continued to counsel the patient regarding smoking cessation.  Diastolic CHF: EKG without any acute ischemic changes and troponins were negative. Patient with no clinical signs of acute  decompensation. Patient was continued on home Lasix 40 mg by mouth daily.  Discharge Vitals:   BP 140/81 mmHg  Pulse 88  Temp(Src) 98.4 F (36.9 C) (Oral)  Resp 20  Ht 5' 5" (1.651 m)  Wt 254 lb 3.1 oz (115.3 kg)  BMI 42.30 kg/m2  SpO2 94%  Discharge Labs:  No results found for this or any previous visit (from the past 24 hour(s)).  Signed: Luan Moore, MD 03/21/2014, 1:41 PM    Services Ordered on Discharge: none Equipment Ordered on Discharge: none

## 2014-03-19 NOTE — H&P (Signed)
Date: 03/19/2014               Patient Name:  [name] MRN: [id number]  DOB: [birth year] Age / Sex: 58 y.o., female   PCP: Jessee Avers, MD         Medical Service: Internal Medicine Teaching Service         Attending Physician: Dr. Aldine Contes, MD    First Contact: Dr. Luan Moore Pager: [phone]  Second Contact: Dr. 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 Patient states she has had gradual worsening of her persistent cough and shortness of breath for the last 2-3 days. She reports her cough has been productive of yellow sputum. She reports chills, fatigue, and "feeling shaky", but denies fevers and wheezing. She states nothing has really changed from her baseline, but she came to the hospital because she "couldn't get air in or out." She denies any sick contacts. She reports she is still smoking 5-6 cigarettes a day despite being on oxygen. She reports her breathing improved after receiving a nebulizer treatment in the ED. Of note, patient has been hospitalized twice in the last 6 weeks for acute on chronic hypercarbic respiratory failure due to her severe COPD. She also has a history of medication noncompliance.   In the ED, patient was saturating at 96% on 4 L oxygen and then desaturated to 87% upon ambulation. A CXR was done which showed no evidence of pneumonia, only stable mild central pulmonary vascular congestion.   Meds: No current facility-administered medications for this encounter.    Allergies: Allergies as of 03/18/2014 - Review Complete 03/18/2014  Allergen Reaction Noted  . Fluconazole  Anaphylaxis, Itching, and Swelling    Past Medical History  Diagnosis Date  . Coronary artery disease     S/P PCI of LAD with DES (12/2008). Total occlusion of RCA noted at that time., medically managed. ACS ruled out 03/2009 with Lexiscan myoview . Followed by [facility].  . Pulmonary hypertension     2-D Echo (77/8242) - Systolic pressure was moderately increased. PA peak pressure  71mHg. 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 Followed by Dr. WJoya Gaskins(Vel[facility] Heckler  . Diastolic dysfunction     2-D Echo (12/2008) - Normal LV Systolic funciton with EF 60-65%. Grade 1 diastolid dysfunction. No regional wall motion abnormalities. Moderate pulmonary HTN with PA peak pressure 525mg.  . Marland KitchenOPD (chronic obstructive pulmonary disease)     Severe. Gold Stage IV.  PFTs (12/2008) - severe obstructive airway disease. Active tobacco use. Requires 4L O2 at home.  . Pulmonary nodule, right     Small right middle lobe nodule. Stable as of 12/2008.  . Marland Kitchenleep apnea     Presumptive. No documented sleep studies.   . Prediabetes 12/2008    HgbA1c 6.4 (12/2008)  . Hx MRSA infection     Recurrent MRSA thigh abscesses.  . Tobacco abuse     Ongoing.  . Obesity   . Hyperlipidemia   . GERD (gastroesophageal  reflux disease)     S/P Nissen fundoplication.  . Shortness of breath   . CHF (congestive heart failure)    Past Surgical History  Procedure Laterality Date  . Total abdominal hysterectomy w/ bilateral salpingoophorectomy    . Nissen fundoplication    . Right heart catheterization N/A 11/09/2012    Procedure: RIGHT HEART CATH;  Surgeon: Larey Dresser, MD;  Location: [facility] CATH LAB;  Service: Cardiovascular;  Laterality: N/A;   Family History  Problem Relation Age of Onset  . Heart disease Mother 74    Deceased from MI at 106yo  . Hypertension Mother   . Heart disease Father 51    Deceased of MI age 75yo    . Hypertension Father   . Hypertension Brother   . Lung cancer      Grandmother   History   Social History  . Marital Status: Single    Spouse Name: N/A    Number of Children: N/A  . Years of Education: N/A   Occupational History  . Not on file.   Social History Main Topics  . Smoking status: Current Every Day Smoker -- 0.50 packs/day for 40 years    Types: Cigarettes  . Smokeless tobacco: Never Used     Comment: 2 per day  . Alcohol Use: No  . Drug Use: No  . Sexual Activity: Not on file   Other Topics Concern  . Not on file   Social History Narrative   Formerly worked as a Scientist, water quality, now disabled.   Divorced.   2 grown children.   Lives with her grandson.    Review of Systems: General: Denies night sweats, changes in weight, changes in appetite HEENT: Denies headaches, ear pain, changes in vision, rhinorrhea, sore throat CV: Denies CP, palpitations, orthopnea Pulm: See HPI GI: Denies abdominal pain, nausea, vomiting, diarrhea, constipation, melena, hematochezia GU: Denies dysuria, hematuria, frequency Msk: Denies muscle cramps, joint pains Neuro: Denies weakness, numbness, tingling Skin: Denies rashes, bruising  Physical Exam: Blood pressure 166/83, pulse 95, temperature 98.4 F (36.9 C), temperature source Oral, resp. rate 22, height _0  (1.651 m), weight 113.399 kg (250 lb), SpO2 92 %. General: alert, sitting up in bed, NAD, breathing comfortably  HEENT: [place]/AT, EOMI, sclera anicteric, mucus membranes moist CV: RRR, normal S1/S2, no m/g/r Pulm: CTA bilaterally, no wheezing present, appears comfortable on 4 L oxygen via  Abd: BS+, obese, non-tender Ext: warm, no edema, moves all Neuro: alert and oriented x 3, CNs II-XII intact, strength 5/5 in upper and lower extremities bilaterally  Lab results: Basic Metabolic Panel:  Recent Labs  03/18/14 1839  NA 143  K 4.5  CL 98  CO2 37*  GLUCOSE 103*  BUN 8  CREATININE 0.65  CALCIUM 9.2   Liver  Function Tests:  Recent Labs  03/18/14 1839  AST 12  ALT 9  ALKPHOS 113  BILITOT 0.4  PROT 6.5  ALBUMIN 3.3*   No results for input(s): LIPASE, AMYLASE in the last 72 hours. No results for input(s): AMMONIA in the last 72 hours. CBC:  Recent Labs  03/18/14 1839  WBC 6.8  NEUTROABS 4.5  HGB 13.4  HCT 44.0  MCV 97.6  PLT 137*   Cardiac Enzymes: No results for input(s): CKTOTAL, CKMB, CKMBINDEX, TROPONINI in the last 72 hours. BNP:  Recent Labs  03/18/14 1840  PROBNP 400.4*   D-Dimer: No results for input(s): DDIMER in the last 72 hours. CBG: No results for input(s): GLUCAP in  the last 72 hours. Hemoglobin A1C: No results for input(s): HGBA1C in the last 72 hours. Fasting Lipid Panel: No results for input(s): CHOL, HDL, LDLCALC, TRIG, CHOLHDL, LDLDIRECT in the last 72 hours. Thyroid Function Tests: No results for input(s): TSH, T4TOTAL, FREET4, T3FREE, THYROIDAB in the last 72 hours. Anemia Panel: No results for input(s): VITAMINB12, FOLATE, FERRITIN, TIBC, IRON, RETICCTPCT in the last 72 hours. Coagulation: No results for input(s): LABPROT, INR in the last 72 hours. Urine Drug Screen: Drugs of Abuse     Component Value Date/Time   LABOPIA NONE DETECTED 07/01/2013 1218   LABOPIA NEGATIVE 02/04/2009 2315   COCAINSCRNUR NONE DETECTED 07/01/2013 1218   COCAINSCRNUR NEGATIVE 02/04/2009 2315   LABBENZ NONE DETECTED 07/01/2013 1218   LABBENZ NEGATIVE 02/04/2009 2315   AMPHETMU NONE DETECTED 07/01/2013 1218   AMPHETMU NEGATIVE 02/04/2009 2315   THCU NONE DETECTED 07/01/2013 1218   LABBARB NONE DETECTED 07/01/2013 1218    Alcohol Level: No results for input(s): ETH in the last 72 hours. Urinalysis: No results for input(s): COLORURINE, LABSPEC, PHURINE, GLUCOSEU, HGBUR, BILIRUBINUR, KETONESUR, PROTEINUR, UROBILINOGEN, NITRITE, LEUKOCYTESUR in the last 72 hours.  Invalid input(s): APPERANCEUR   Imaging results:  Dg Chest 2 View  03/18/2014   CLINICAL  DATA:  Shortness of breath.  EXAM: CHEST  2 VIEW  COMPARISON:  February 22, 2014.  FINDINGS: Stable cardiomegaly. Stable mild central pulmonary vascular congestion is noted. No pneumothorax or significant pleural effusion is noted. Stable bibasilar interstitial densities are noted most consistent with scarring, but superimposed pulmonary edema cannot be excluded. Bony thorax appears intact.  IMPRESSION: Stable bibasilar interstitial densities are noted most consistent with scarring, but superimposed pulmonary edema cannot be excluded. Stable mild central pulmonary vascular congestion is noted.   Electronically Signed   By: Sabino Dick M.D.   On: 03/18/2014 20:24    Other results: EKG: normal sinus rhythm, rightward axis  Assessment & Plan by Problem: Principal Problem:   COPD with acute exacerbation Active Problems:   Obesity   TOBACCO ABUSE   Coronary atherosclerosis   Chronic diastolic CHF (congestive heart failure)   Acute on chronic respiratory failure with hypercapnia Ms. Mertz is a 58 year old woman with history of Gold stage IV COPD on continuous 4L O2, grade 1 diastolic CHF, pulmonary HTN, CAD s/p PCI in 2010, HLD, tobacco abuse presenting with shortness of breath and cough x 2-3 days. She was 11/30 for acute on chronic hypercapnic respiratory failure. She nearly required intubation in 01/2014.  Acute COPD exacerbation: Increased shortness of breath, cough, wheezing, and general fatigue consistent with acute COPD exacerbation. She is afebrile. No leukocytosis. Chest x-ray does not indicate pneumonia. She felt better after nebulizing treatment in the ED, but continued to desaturate to upper 70s on ambulation thus admission. Her home regimen includes Advair, Spiriva and short acting bronchodilators. She received Solu-Medrol 125 mg IV.  Plan - Admit under observation  - By mouth prednisone 40 mg daily. - Duo-Nebs every 4 hours - Nebulizing albuterol every 2 hours when necessary -  Doxycycline 100 mg twice a day - Caution with oxygen to maintain O2 sats between 88-92%. Avoid higher oxygen saturations given her chronic hypercapnic state. - Hold Spiriva and Advair. restart on discharge.  -  Nicotine patch. She continues to smoke.  Diastolic CHF: Last echo 04/10/4942 - EF 55-60%. Her weight is 255lb from 250 on 03/03/2014. She is noncompliant with her Lasix therapy at home. ProBNP is 400 down from 1172 on 02/22/2014. EKG without acute  ischemic changes and troponins negative.  Plan  - Cont with lasix 40 mg PO daily - Daily weights, strict i/os - Repeat EKG in AM  Diet: Carb modified  VTE PPx: Lovenox SQ Dispo: Disposition is deferred at this time, awaiting improvement of current medical problems. Anticipated discharge in approximately 1-2 day(s).   The patient does have a current PCP Jessee Avers, MD) and does need an [facility] hospital follow-up appointment after discharge.  The patient does not have transportation limitations that hinder transportation to clinic appointments.  Signed: Albin Felling, MD 03/19/2014, 1:32 AM

## 2014-03-19 NOTE — Progress Notes (Signed)
Patient ambulated about 100 ft in the hall way with O2 at 4L, her pulse ox was 85. Increased it to 6 and she desat down to 80%. Monitoring pulse ox at rest to see how she does.

## 2014-03-19 NOTE — Progress Notes (Signed)
UR completed.

## 2014-03-20 DIAGNOSIS — I5030 Unspecified diastolic (congestive) heart failure: Secondary | ICD-10-CM

## 2014-03-20 DIAGNOSIS — D696 Thrombocytopenia, unspecified: Secondary | ICD-10-CM

## 2014-03-20 DIAGNOSIS — Z720 Tobacco use: Secondary | ICD-10-CM

## 2014-03-20 LAB — D-DIMER, QUANTITATIVE: D-Dimer, Quant: 0.3 ug/mL-FEU (ref 0.00–0.48)

## 2014-03-20 MED ORDER — POLYETHYLENE GLYCOL 3350 17 G PO PACK
17.0000 g | PACK | Freq: Every day | ORAL | Status: DC | PRN
Start: 2014-03-20 — End: 2014-04-10

## 2014-03-20 MED ORDER — POLYETHYLENE GLYCOL 3350 17 G PO PACK
17.0000 g | PACK | Freq: Every day | ORAL | Status: DC | PRN
  Administered 2014-03-20: 17 g via ORAL
  Filled 2014-03-20 (×2): qty 1

## 2014-03-20 MED ORDER — DSS 100 MG PO CAPS: 100.0000 mg | ORAL_CAPSULE | Freq: Every day | ORAL | Status: DC

## 2014-03-20 MED ORDER — DOCUSATE SODIUM 100 MG PO CAPS
100.0000 mg | ORAL_CAPSULE | Freq: Every day | ORAL | Status: DC
  Filled 2014-03-20: qty 1

## 2014-03-20 NOTE — Progress Notes (Addendum)
Subjective:  Patient desaturated to 85% with ambulation yesterday. Patient reports doing well this morning, able to ambulate back and forth from the bathroom with no major issues. Patient was extensively counseled on the importance of smoking cessation and its ability to keep her from having to come into the hospital.   She was short of breath with ambulation this morning with desat to 81 after about 173f. Feels that this is not her baseline.  Objective: Vital signs in last 24 hours: Filed Vitals:   03/19/14 1456 03/19/14 2101 03/20/14 0200 03/20/14 0546  BP: 152/84 132/59 123/73 124/70  Pulse: 84 87 89 84  Temp: 98.2 F (36.8 C) 98 F (36.7 C) 98 F (36.7 C) 98.2 F (36.8 C)  TempSrc: Oral Oral Oral Oral  Resp: _0 Height:      Weight:    254 lb 3.1 oz (115.3 kg)  SpO2: 90% 92% 94% 94%   Weight change: 4 lb 3.1 oz (1.901 kg)  Intake/Output Summary (Last 24 hours) at 03/20/14 0826 Last data filed at 03/20/14 06219 Gross per 24 hour  Intake   1440 ml  Output   1250 ml  Net    190 ml   General: alert, sitting up in bed, NAD, breathing comfortably   HEENT: [place]/AT, EOMI, sclera anicteric, mucus membranes moist CV: RRR, normal S1/S2, no m/g/r Pulm: CTA bilaterally, no wheezing, rales, or rhonchi, no increased work of breathing, on 4 L nasal cannula Ext: warm, moves all, 2+ edema in bilateral lower extremities Neuro: alert and oriented x 3, CNs II-XII intact, strength 5/5 in upper and lower extremities bilaterally  Lab Results: Basic Metabolic Panel:  Recent Labs Lab 03/18/14 1839  NA 143  K 4.5  CL 98  CO2 37*  GLUCOSE 103*  BUN 8  CREATININE 0.65  CALCIUM 9.2   Liver Function Tests:  Recent Labs Lab 03/18/14 1839  AST 12  ALT 9  ALKPHOS 113  BILITOT 0.4  PROT 6.5  ALBUMIN 3.3*   No results for input(s): LIPASE, AMYLASE in the last 168 hours. No results for input(s): AMMONIA in the last 168 hours. CBC:  Recent Labs Lab 03/18/14 1839   WBC 6.8  NEUTROABS 4.5  HGB 13.4  HCT 44.0  MCV 97.6  PLT 137*   Cardiac Enzymes:  Recent Labs Lab 03/19/14 0402  TROPONINI <0.30   BNP:  Recent Labs Lab 03/18/14 1840  PROBNP 400.4*   D-Dimer: No results for input(s): DDIMER in the last 168 hours. CBG: No results for input(s): GLUCAP in the last 168 hours. Hemoglobin A1C: No results for input(s): HGBA1C in the last 168 hours. Fasting Lipid Panel: No results for input(s): CHOL, HDL, LDLCALC, TRIG, CHOLHDL, LDLDIRECT in the last 168 hours. Thyroid Function Tests: No results for input(s): TSH, T4TOTAL, FREET4, T3FREE, THYROIDAB in the last 168 hours. Coagulation: No results for input(s): LABPROT, INR in the last 168 hours. Anemia Panel: No results for input(s): VITAMINB12, FOLATE, FERRITIN, TIBC, IRON, RETICCTPCT in the last 168 hours. Urine Drug Screen: Drugs of Abuse     Component Value Date/Time   LABOPIA NONE DETECTED 07/01/2013 1218   LABOPIA NEGATIVE 02/04/2009 2315   COCAINSCRNUR NONE DETECTED 07/01/2013 1218   COCAINSCRNUR NEGATIVE 02/04/2009 2315   LABBENZ NONE DETECTED 07/01/2013 1218   LABBENZ NEGATIVE 02/04/2009 2315   AMPHETMU NONE DETECTED 07/01/2013 1218   AMPHETMU NEGATIVE 02/04/2009 2315   THCU NONE DETECTED 07/01/2013 1218   LABBARB NONE DETECTED 07/01/2013  1218    Alcohol Level: No results for input(s): ETH in the last 168 hours. Urinalysis: No results for input(s): COLORURINE, LABSPEC, PHURINE, GLUCOSEU, HGBUR, BILIRUBINUR, KETONESUR, PROTEINUR, UROBILINOGEN, NITRITE, LEUKOCYTESUR in the last 168 hours.  Invalid input(s): APPERANCEUR Micro Results: No results found for this or any previous visit (from the past 240 hour(s)). Studies/Results: Dg Chest 2 View  03/18/2014   CLINICAL DATA:  Shortness of breath.  EXAM: CHEST  2 VIEW  COMPARISON:  February 22, 2014.  FINDINGS: Stable cardiomegaly. Stable mild central pulmonary vascular congestion is noted. No pneumothorax or significant  pleural effusion is noted. Stable bibasilar interstitial densities are noted most consistent with scarring, but superimposed pulmonary edema cannot be excluded. Bony thorax appears intact.  IMPRESSION: Stable bibasilar interstitial densities are noted most consistent with scarring, but superimposed pulmonary edema cannot be excluded. Stable mild central pulmonary vascular congestion is noted.   Electronically Signed   By: Sabino Dick M.D.   On: 03/18/2014 20:24   Medications: I have reviewed the patient's current medications. Scheduled Meds: . doxycycline  100 mg Oral Q12H  . enoxaparin (LOVENOX) injection  40 mg Subcutaneous Q24H  . guaiFENesin  600 mg Oral BID  . nicotine  14 mg Transdermal Daily  . pravastatin  20 mg Oral q1800  . predniSONE  40 mg Oral Q breakfast   Continuous Infusions:  PRN Meds:.acetaminophen, furosemide, ipratropium-albuterol Assessment/Plan: Principal Problem:   COPD with acute exacerbation Active Problems:   Obesity   TOBACCO ABUSE   Coronary atherosclerosis   Chronic diastolic CHF (congestive heart failure)   Acute on chronic respiratory failure with hypercapnia  Patient is a 58 year old woman with history of Gold stage IV COPD on continuous 4L O2, grade 1 diastolic CHF, pulmonary HTN, CAD s/p PCI in 2010, HLD, tobacco abuse presenting with COPD exacerbation.   Acute COPD exacerbation: End stage COPD on 4 L. Increased dyspnea and cough on presentation, now improved. Sputum production has been at her baseline. Admission CXR with no evidence of pneumonia but possible edema. Desaturated to 66 yesterday with ambulation. Patient has weaned back to home level of 4 L oxygen this morning. Desat to 81 on ambulation after 142f of walking.  -recommend follow up with pulmonology as an outpatient -continue doxycycline 100 mg BID (start date 03/19/14) -maintain O2 sats between 88-92% -prednisone 40 mg PO (start date 03/19/14) -duonebs q 4 hours  prn -albuterol -appreciate case management aid with medicine affordability -stat d-dimer, will consider further imaging if positive  Tobacco abuse: -counseled on smoking cessation -nicotine patch  Diastolic CHF: Last echo 85/0/9326- EF 55-60%. Her weight is 254 lb from 250 on 03/03/2014. Patient is +640 mL since admission. She is noncompliant with her Lasix therapy at home. Upon admission, ProBNP is 400 down from 1172 on 02/22/2014. EKG without acute ischemic changes and troponins negative.   - Cont with lasix 40 mg PO daily. Admission x-ray could not exclude edema.  - Consider repeat chest x-ray if patient's saturations do not improve.  - Daily weights, strict i/os  Thrombocytopenia: Admission platelets of 137. Stable from prior.   Diet: Carb modified VTE PPx: Lovenox SQ Dispo: Anticipated discharge is today.  The patient does have a current PCP (Jessee Avers MD) and [facility].  The patient does not have transportation limitations that hinder transportation to clinic appointments.   LOS: 2 days   LLuan Moore MD 03/20/2014, 8:26 AM

## 2014-03-20 NOTE — Progress Notes (Signed)
Patient is currently active with [facility] for chronic disease management services.  Patient has been engaged by a [employer] and LCSW.  Our community based plan of care has focused on disease management  And community resource support.  Patient will receive a post discharge transition of care call and will be evaluated for monthly home visits for assessments and disease process education.  Made Inpatient Case Manager aware that [facility] following. Patient states she will accept telephone calls for follow up but doesn't currently see the need for someone coming to her home. Of note, [facility] Management services does not replace or interfere with any services that are arranged by inpatient case management or social work.  For additional questions or referrals please contact, Natividad Brood, RN, BSN, CCM, [facility], [phone].

## 2014-03-20 NOTE — Progress Notes (Signed)
Nutrition Brief Note  Patient identified on the Malnutrition Screening Tool (MST) Report  Wt Readings from Last 15 Encounters:  03/20/14 254 lb 3.1 oz (115.3 kg)  02/24/14 251 lb (113.853 kg)  02/10/14 256 lb 1.6 oz (116.166 kg)  02/02/14 250 lb 3.6 oz (113.5 kg)  01/22/14 261 lb 12.8 oz (118.752 kg)  12/22/13 254 lb (115.214 kg)  12/02/13 254 lb 3.2 oz (115.304 kg)  11/27/13 247 lb 8 oz (112.265 kg)  08/21/13 236 lb 8.9 oz (107.3 kg)  08/07/13 246 lb 11.2 oz (111.902 kg)  07/03/13 235 lb 7.2 oz (106.8 kg)  06/12/13 243 lb 9.6 oz (110.496 kg)  04/24/13 242 lb 8 oz (109.997 kg)  03/13/13 245 lb 15.1 oz (111.56 kg)  02/06/13 247 lb 14.4 oz (112.447 kg)    Body mass index is 42.3 kg/(m^2). Patient meets criteria for Morbid Obesity based on current BMI. No evidence of weight loss per weight history. Pt reports having a decreased appetite 2-3 days PTA; her appetite has not returned to normal but, she is eating her meals.   Current diet order is Carb Modified, patient is consuming approximately 75-100% of meals at this time. Discussed diet and answered patient's questions. Labs and medications reviewed.   No nutrition interventions warranted at this time. If nutrition issues arise, please consult RD.   Pryor Ochoa RD, LDN Inpatient Clinical Dietitian Pager: [phone] After Hours Pager: [phone]

## 2014-03-20 NOTE — Progress Notes (Signed)
UR completed.

## 2014-03-20 NOTE — Progress Notes (Signed)
Pt seen and examined with Dr. Raelene Bott. Case d/w residents in detail on rounds. Please refer to resident note for details  Pt still with episode of desaturation to the early 80s whole ambulating and still with DOE.  Physical Exam: Gen: AAO*3, NAD CVS: RRR, normal heart sounds Pulm: CTA b/l, no wheezes Abd: soft, non tender, BS + Ext: no edema  Assessment and Plan: 58 y/o female with likely acute COPD exacerbation  Acute COPD exacerbation: - Pt with no evidence of PNA on exam and with normal WBC. Will treat as a COPD exacerbation - c/w nebs prn, O2 via [place] - Pt still with episodes of desaturation to 80s on ambulation- likely secondary to COPD - Pt with low Wells score and D Dimer is negative- unlikely PE - c/w prednisone 40 mg * 5 days. Restart advair on d/c - c/w doxycycline for now - Will reassess in the afternoon

## 2014-03-20 NOTE — Progress Notes (Signed)
Patient is discharged from room 3E16 at this time. Alert and in stable condition. IV site d/c'd as well as tele. Instructions read to patient and understanding verbalized. Left unit via wheelchair with all belongings at side.

## 2014-03-20 NOTE — Progress Notes (Signed)
Patient ambulated this am to the hallway with O2 4L in situ. She desats between 81 and 84 and was very exhausted after the 1101f walk. Put back in bed and will keep monitoring.

## 2014-04-01 ENCOUNTER — Ambulatory Visit: Payer: Medicare HMO | Admitting: Internal Medicine

## 2014-04-10 ENCOUNTER — Telehealth: Payer: Self-pay | Admitting: *Deleted

## 2014-04-10 ENCOUNTER — Ambulatory Visit (INDEPENDENT_AMBULATORY_CARE_PROVIDER_SITE_OTHER): Payer: Medicare HMO | Admitting: Internal Medicine

## 2014-04-10 ENCOUNTER — Inpatient Hospital Stay (HOSPITAL_COMMUNITY)
Admission: AD | Admit: 2014-04-10 | Discharge: 2014-04-12 | DRG: 293 | Disposition: A | Discharge status: Home / Self Care | Payer: Commercial Managed Care - HMO | Source: Ambulatory Visit | Attending: Internal Medicine | Admitting: Internal Medicine

## 2014-04-10 ENCOUNTER — Observation Stay (HOSPITAL_COMMUNITY): Payer: Commercial Managed Care - HMO

## 2014-04-10 ENCOUNTER — Encounter: Payer: Self-pay | Admitting: Internal Medicine

## 2014-04-10 ENCOUNTER — Encounter (HOSPITAL_COMMUNITY): Payer: Self-pay | Admitting: Surgery

## 2014-04-10 ENCOUNTER — Other Ambulatory Visit: Payer: Self-pay

## 2014-04-10 VITALS — BP 113/68 | HR 88 | Temp 98.0°F | Ht 65.0 in | Wt 256.9 lb

## 2014-04-10 DIAGNOSIS — Z955 Presence of coronary angioplasty implant and graft: Secondary | ICD-10-CM

## 2014-04-10 DIAGNOSIS — R0789 Other chest pain: Secondary | ICD-10-CM | POA: Diagnosis present

## 2014-04-10 DIAGNOSIS — I10 Essential (primary) hypertension: Secondary | ICD-10-CM | POA: Diagnosis present

## 2014-04-10 DIAGNOSIS — E785 Hyperlipidemia, unspecified: Secondary | ICD-10-CM | POA: Diagnosis not present

## 2014-04-10 DIAGNOSIS — I272 Other secondary pulmonary hypertension: Secondary | ICD-10-CM | POA: Diagnosis not present

## 2014-04-10 DIAGNOSIS — Z720 Tobacco use: Secondary | ICD-10-CM | POA: Diagnosis not present

## 2014-04-10 DIAGNOSIS — I5033 Acute on chronic diastolic (congestive) heart failure: Principal | ICD-10-CM | POA: Diagnosis present

## 2014-04-10 DIAGNOSIS — J449 Chronic obstructive pulmonary disease, unspecified: Secondary | ICD-10-CM | POA: Diagnosis not present

## 2014-04-10 DIAGNOSIS — R0600 Dyspnea, unspecified: Secondary | ICD-10-CM | POA: Diagnosis present

## 2014-04-10 DIAGNOSIS — Z883 Allergy status to other anti-infective agents status: Secondary | ICD-10-CM

## 2014-04-10 DIAGNOSIS — Z9981 Dependence on supplemental oxygen: Secondary | ICD-10-CM | POA: Diagnosis not present

## 2014-04-10 DIAGNOSIS — R918 Other nonspecific abnormal finding of lung field: Secondary | ICD-10-CM | POA: Diagnosis not present

## 2014-04-10 DIAGNOSIS — I2510 Atherosclerotic heart disease of native coronary artery without angina pectoris: Secondary | ICD-10-CM | POA: Diagnosis not present

## 2014-04-10 DIAGNOSIS — K5900 Constipation, unspecified: Secondary | ICD-10-CM | POA: Diagnosis present

## 2014-04-10 DIAGNOSIS — J9621 Acute and chronic respiratory failure with hypoxia: Secondary | ICD-10-CM

## 2014-04-10 DIAGNOSIS — Z9114 Patient's other noncompliance with medication regimen: Secondary | ICD-10-CM | POA: Diagnosis present

## 2014-04-10 DIAGNOSIS — Z90710 Acquired absence of both cervix and uterus: Secondary | ICD-10-CM

## 2014-04-10 DIAGNOSIS — Z8249 Family history of ischemic heart disease and other diseases of the circulatory system: Secondary | ICD-10-CM | POA: Diagnosis not present

## 2014-04-10 DIAGNOSIS — F17210 Nicotine dependence, cigarettes, uncomplicated: Secondary | ICD-10-CM | POA: Diagnosis not present

## 2014-04-10 DIAGNOSIS — M940 Chondrocostal junction syndrome [Tietze]: Secondary | ICD-10-CM | POA: Diagnosis not present

## 2014-04-10 LAB — CBC WITH DIFFERENTIAL/PLATELET
BASOS ABS: 0 10*3/uL (ref 0.0–0.1)
BASOS PCT: 0 % (ref 0–1)
EOS ABS: 0.2 10*3/uL (ref 0.0–0.7)
EOS PCT: 2 % (ref 0–5)
HEMATOCRIT: 46 % (ref 36.0–46.0)
Hemoglobin: 14.1 g/dL (ref 12.0–15.0)
LYMPHS PCT: 21 % (ref 12–46)
Lymphs Abs: 1.6 10*3/uL (ref 0.7–4.0)
MCH: 30.7 pg (ref 26.0–34.0)
MCHC: 30.7 g/dL (ref 30.0–36.0)
MCV: 100.2 fL — ABNORMAL HIGH (ref 78.0–100.0)
Monocytes Absolute: 0.9 10*3/uL (ref 0.1–1.0)
Monocytes Relative: 12 % (ref 3–12)
NEUTROS PCT: 65 % (ref 43–77)
Neutro Abs: 4.9 10*3/uL (ref 1.7–7.7)
PLATELETS: 146 10*3/uL — AB (ref 150–400)
RBC: 4.59 MIL/uL (ref 3.87–5.11)
RDW: 13.7 % (ref 11.5–15.5)
WBC: 7.5 10*3/uL (ref 4.0–10.5)

## 2014-04-10 LAB — BLOOD GAS, ARTERIAL
ACID-BASE EXCESS: 19.8 mmol/L — AB (ref 0.0–2.0)
Bicarbonate: 46.1 mEq/L — ABNORMAL HIGH (ref 20.0–24.0)
O2 CONTENT: 3 L/min
O2 SAT: 94.6 %
Patient temperature: 98.6
TCO2: 48.6 mmol/L (ref 0–100)
pCO2 arterial: 78.9 mmHg (ref 35.0–45.0)
pH, Arterial: 7.385 (ref 7.350–7.450)
pO2, Arterial: 65.7 mmHg — ABNORMAL LOW (ref 80.0–100.0)

## 2014-04-10 LAB — COMPREHENSIVE METABOLIC PANEL
ALK PHOS: 106 U/L (ref 39–117)
ALT: 10 U/L (ref 0–35)
ANION GAP: 6 (ref 5–15)
AST: 15 U/L (ref 0–37)
Albumin: 3.3 g/dL — ABNORMAL LOW (ref 3.5–5.2)
BILIRUBIN TOTAL: 0.7 mg/dL (ref 0.3–1.2)
BUN: 5 mg/dL — ABNORMAL LOW (ref 6–23)
CHLORIDE: 92 meq/L — AB (ref 96–112)
CO2: 43 mmol/L (ref 19–32)
Calcium: 9 mg/dL (ref 8.4–10.5)
Creatinine, Ser: 0.8 mg/dL (ref 0.50–1.10)
GFR calc Af Amer: >90 mL/min (ref >90)
GFR calc non Af Amer: 79 mL/min — ABNORMAL LOW (ref >90)
Glucose, Bld: 92 mg/dL (ref 70–99)
Potassium: 4.3 mmol/L (ref 3.5–5.1)
SODIUM: 141 mmol/L (ref 135–145)
Total Protein: 6.3 g/dL (ref 6.0–8.3)

## 2014-04-10 LAB — BRAIN NATRIURETIC PEPTIDE: B NATRIURETIC PEPTIDE 5: 171.9 pg/mL — AB (ref 0.0–100.0)

## 2014-04-10 LAB — TROPONIN I: Troponin I: <0.03 ng/mL (ref <0.031)

## 2014-04-10 LAB — MRSA PCR SCREENING: MRSA by PCR: NEGATIVE

## 2014-04-10 MED ORDER — FUROSEMIDE 10 MG/ML IJ SOLN
40.0000 mg | Freq: Two times a day (BID) | INTRAMUSCULAR | Status: DC
  Administered 2014-04-11: 40 mg via INTRAVENOUS
  Filled 2014-04-10 (×3): qty 4

## 2014-04-10 MED ORDER — ONDANSETRON HCL 4 MG PO TABS: 4.0000 mg | ORAL_TABLET | Freq: Four times a day (QID) | ORAL | Status: DC | PRN

## 2014-04-10 MED ORDER — SODIUM CHLORIDE 0.9 % IV SOLN: 250.0000 mL | INTRAVENOUS | Status: DC | PRN

## 2014-04-10 MED ORDER — SODIUM CHLORIDE 0.9 % IJ SOLN: 3.0000 mL | INTRAMUSCULAR | Status: DC | PRN

## 2014-04-10 MED ORDER — ALBUTEROL SULFATE (2.5 MG/3ML) 0.083% IN NEBU: 2.5000 mg | INHALATION_SOLUTION | Freq: Every day | RESPIRATORY_TRACT | Status: DC | PRN

## 2014-04-10 MED ORDER — ALBUTEROL SULFATE (2.5 MG/3ML) 0.083% IN NEBU: 3.0000 mL | INHALATION_SOLUTION | Freq: Three times a day (TID) | RESPIRATORY_TRACT | Status: DC | PRN

## 2014-04-10 MED ORDER — DOCUSATE SODIUM 100 MG PO CAPS
100.0000 mg | ORAL_CAPSULE | Freq: Every day | ORAL | Status: DC
  Administered 2014-04-11: 100 mg via ORAL
  Filled 2014-04-10: qty 1

## 2014-04-10 MED ORDER — ACETAMINOPHEN 650 MG RE SUPP: 650.0000 mg | Freq: Four times a day (QID) | RECTAL | Status: DC | PRN

## 2014-04-10 MED ORDER — PRAVASTATIN SODIUM 20 MG PO TABS
20.0000 mg | ORAL_TABLET | Freq: Every day | ORAL | Status: DC
  Administered 2014-04-10 – 2014-04-11 (×2): 20 mg via ORAL
  Filled 2014-04-10 (×3): qty 1

## 2014-04-10 MED ORDER — CETYLPYRIDINIUM CHLORIDE 0.05 % MT LIQD
7.0000 mL | Freq: Two times a day (BID) | OROMUCOSAL | Status: DC
  Administered 2014-04-11: 7 mL via OROMUCOSAL

## 2014-04-10 MED ORDER — BUDESONIDE-FORMOTEROL FUMARATE 160-4.5 MCG/ACT IN AERO
1.0000 | INHALATION_SPRAY | Freq: Every day | RESPIRATORY_TRACT | Status: DC
  Administered 2014-04-10 – 2014-04-12 (×3): 1 via RESPIRATORY_TRACT
  Filled 2014-04-10: qty 6

## 2014-04-10 MED ORDER — SODIUM CHLORIDE 0.9 % IJ SOLN
3.0000 mL | Freq: Two times a day (BID) | INTRAMUSCULAR | Status: DC
  Administered 2014-04-10 – 2014-04-12 (×4): 3 mL via INTRAVENOUS

## 2014-04-10 MED ORDER — ONDANSETRON HCL 4 MG/2ML IJ SOLN: 4.0000 mg | Freq: Four times a day (QID) | INTRAMUSCULAR | Status: DC | PRN

## 2014-04-10 MED ORDER — TIOTROPIUM BROMIDE MONOHYDRATE 18 MCG IN CAPS
18.0000 ug | ORAL_CAPSULE | Freq: Every day | RESPIRATORY_TRACT | Status: DC
  Administered 2014-04-11 – 2014-04-12 (×2): 18 ug via RESPIRATORY_TRACT
  Filled 2014-04-10: qty 5

## 2014-04-10 MED ORDER — MOMETASONE FURO-FORMOTEROL FUM 100-5 MCG/ACT IN AERO
2.0000 | INHALATION_SPRAY | Freq: Two times a day (BID) | RESPIRATORY_TRACT | Status: DC
  Filled 2014-04-10: qty 8.8

## 2014-04-10 MED ORDER — ALBUTEROL SULFATE (2.5 MG/3ML) 0.083% IN NEBU
2.5000 mg | INHALATION_SOLUTION | Freq: Once | RESPIRATORY_TRACT | Status: AC
  Administered 2014-04-10: 2.5 mg via RESPIRATORY_TRACT

## 2014-04-10 MED ORDER — ACETAMINOPHEN 325 MG PO TABS
650.0000 mg | ORAL_TABLET | Freq: Four times a day (QID) | ORAL | Status: DC | PRN
  Administered 2014-04-10 – 2014-04-11 (×2): 650 mg via ORAL
  Filled 2014-04-10 (×2): qty 2

## 2014-04-10 MED ORDER — SODIUM CHLORIDE 0.9 % IJ SOLN
3.0000 mL | Freq: Two times a day (BID) | INTRAMUSCULAR | Status: DC
  Administered 2014-04-11 (×2): 3 mL via INTRAVENOUS

## 2014-04-10 MED ORDER — NICOTINE 14 MG/24HR TD PT24
14.0000 mg | MEDICATED_PATCH | Freq: Every day | TRANSDERMAL | Status: DC
  Administered 2014-04-10 – 2014-04-12 (×3): 14 mg via TRANSDERMAL
  Filled 2014-04-10 (×3): qty 1

## 2014-04-10 MED ORDER — IPRATROPIUM BROMIDE 0.02 % IN SOLN
0.5000 mg | Freq: Once | RESPIRATORY_TRACT | Status: AC
  Administered 2014-04-10: 0.5 mg via RESPIRATORY_TRACT

## 2014-04-10 MED ORDER — HEPARIN SODIUM (PORCINE) 5000 UNIT/ML IJ SOLN
5000.0000 [IU] | Freq: Three times a day (TID) | INTRAMUSCULAR | Status: DC
  Administered 2014-04-10 – 2014-04-12 (×5): 5000 [IU] via SUBCUTANEOUS
  Filled 2014-04-10 (×8): qty 1

## 2014-04-10 NOTE — Progress Notes (Signed)
Patient arrived on unit from IM center, vital signs stable, placed on telemetry.  Patient's oxygen saturation 91% on 6L via nasal cannula.  Dr. Hayes Ludwig made aware; per MD, maintain saturations above 88%.  Patient rating throbbing pain in right chest 7/10.  Dr. Posey Pronto at bedside and aware.  Will await further orders and continue to monitor.

## 2014-04-10 NOTE — Assessment & Plan Note (Addendum)
Pt admitted 12/15 with COPD exacerbation with hypoxia and was started on a 5 day course of steroids and antibiotics. She states that even after completing the therapy course, she did not feel back to her baseline, and her breathing has been progressively getting worse. She endorses a fever of 102 over the weekend but has not checked her temp since. She has intermittently increased her O2 to 6L. She does endorse right lower thoracic pain over the past few days as well with green sputum. She is afebrile in the clinic but dose desaturate to 87% with ambulation on 4L O2. On exam, she has poor air movement, especially at the bases. She is still smoking but is down to 3-4 cig/day. With her h/o severe COPD and her deterioration from hospital discharge with a fever and right sided thoracic pain, I am concerned for a possible pneumonia in the setting of possible COPD exacerbation vs viral illness. She does have increased peripheral edema on exam, and has a reported h/o diastolic dysfunction. Last ECHO 11/2012 with normal EF, normal systolic function, with mildly dilated left and right atria. She states that she at potato chips yesterday and has not been taking her Lasix for 2 weeks, so CHF exacerbation with volume overload could also be contributing.  - Admit to IMTS to tele observation - CBC with diff  - CMP - ProBNP - Respiratory viral panel (unable to be obtained in the clinic) - CXR 2 view once to the floor - Consider fluid restricted diet

## 2014-04-10 NOTE — Progress Notes (Signed)
EKG performed and in chart as patient continues to have right medial chest pain.  Dr. Hayes Ludwig and Dr. Posey Pronto made aware; no additional orders at this time.  Dr. Hayes Ludwig turned patient's oxygen down to 4L via nasal cannula and added parameters for oxygen saturations.  Will continue to monitor.

## 2014-04-10 NOTE — Telephone Encounter (Signed)
Pt called with c/o SOB, cough and fever ( 101 today  - 102 yesterday) Chest hurts with cough, she states it feels like Bronchitis is coming on.  Hx : bronchitis, COPD Onset 2 days ago. Taking OTC meds for symptoms. Drinking okay, denies dizziness. Hospitalized 12/15 - 12/17  Offered an appointment this AM but she is not able to get here until PM Appointment at 1:45.  Pt advised to go to ED if any changes or return of chest pain.  She voices understanding.

## 2014-04-10 NOTE — Progress Notes (Signed)
CRITICAL VALUE ALERT  Critical value received:  CO2 78.9  Date of notification:  04/10/2014  Time of notification:  18:25  Critical value read back:Yes.    Nurse who received alert:  Levander Campion, RN  MD notified (1st page):  Dr. Hayes Ludwig  Time of first page:  18:15  MD notified (2nd page):  Time of second page:  Responding MD:  Dr. Hayes Ludwig  Time MD responded:  18:20

## 2014-04-10 NOTE — Telephone Encounter (Signed)
Agree with plan 

## 2014-04-10 NOTE — Progress Notes (Signed)
CRITICAL VALUE ALERT  Critical value received:  CO2 43  Date of notification:  04/10/2014  Time of notification:  1640  Critical value read back:Yes.    Nurse who received alert:  Levander Campion, RN  MD notified (1st page):  Dr. Hayes Ludwig  Time of first page:  1641  MD notified (2nd page):  Time of second page:  Responding MD:  Dr. Posey Pronto  Time MD responded:  16:50

## 2014-04-10 NOTE — Progress Notes (Signed)
Patient ID: Andria Head, female   DOB: July 30, 1955, 59 y.o.   MRN: [id number]  Subjective:   Patient ID: Britini Garcilazo female   DOB: 1956/01/25 59 y.o.   MRN: [id number]  HPI: Ms.Ikeisha Landree Fernholz is a 59 y.o. F w/ PMH COPD on 4 L [place], pulm HTN, CAD s/p stent, diastolic CHF who presents for an acute visit for increased SOB and dizziness.  She was discharged from the hospital 12/17 after being admitted with a COPD exacerbation. CXR was w/o PNA in the hospital. She was afebrile w/o leukocytosis. Sx improved after neb treatments initially but desaturated to the upper 70s with ambulation. A d-dimer was checked due to persistent hypoxia which was negative. She was ultimately discharged home on a 5 day course of steroids and doxycycline and advised to continue neb treatments.   Today she presents with increased SOB and green sputum production. She endorses dizziness with ambulation. She is is smoking 3-4 cig/day. She states that she did finish the steroid and abx after hospital discharge, but states that even after completion, she did not feel back to baseline and since her respiratory status has been slowly declining.   She did eat potato chips yesterday and has not been taking her Lasix x2 weeks.    Past Medical History  Diagnosis Date  . Coronary artery disease     S/P PCI of LAD with DES (12/2008). Total occlusion of RCA noted at that time., medically managed. ACS ruled out 03/2009 with Lexiscan myoview . Followed by [place] [place].  . Pulmonary hypertension     2-D Echo (42/5956) - Systolic pressure was moderately increased. PA peak pressure  57mHg. 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 Followed by Dr. WJoya Gaskins(Velora Heckler  . Diastolic dysfunction     2-D Echo (12/2008) - Normal LV Systolic funciton with EF 60-65%. Grade 1 diastolid dysfunction. No regional wall motion abnormalities. Moderate  pulmonary HTN with PA peak pressure 510mg.  . Marland KitchenOPD (chronic obstructive pulmonary disease)     Severe. Gold Stage IV.  PFTs (12/2008) - severe obstructive airway disease. Active tobacco use. Requires 4L O2 at home.  . Pulmonary nodule, right     Small right middle lobe nodule. Stable as of 12/2008.  . Marland Kitchenleep apnea     Presumptive. No documented sleep studies.   . Prediabetes 12/2008    HgbA1c 6.4 (12/2008)  . Hx MRSA infection     Recurrent MRSA thigh abscesses.  . Tobacco abuse     Ongoing.  . Obesity   . Hyperlipidemia   . GERD (gastroesophageal reflux disease)     S/P Nissen fundoplication.  . Shortness of breath   . CHF (congestive heart failure)    Current Outpatient Prescriptions  Medication Sig Dispense Refill  . ADVAIR DISKUS 250-50 MCG/DOSE AEPB Inhale 1 puff into the lungs daily.  6  . albuterol (PROVENTIL HFA;VENTOLIN HFA) 108 (90 BASE) MCG/ACT inhaler Inhale 2 puffs into the lungs 3 (three) times daily as needed for wheezing or shortness of breath. 3.7 g 11  . albuterol (PROVENTIL) (2.5 MG/3ML) 0.083% nebulizer solution Take 2.5 mg by nebulization daily as needed for wheezing or shortness of breath.    . Aspirin-Salicylamide-Caffeine (BC HEADACHE POWDER PO) Take 1 Package by mouth as needed (for headache).    . docusate sodium 100 MG CAPS Take 100 mg by mouth daily. 10 capsule 0  . furosemide (  LASIX) 20 MG tablet Take 40 mg by mouth 2 (two) times daily as needed for fluid or edema.     . lovastatin (MEVACOR) 20 MG tablet Take 1 tablet (20 mg total) by mouth at bedtime. 30 tablet 11  . nicotine (NICODERM CQ - DOSED IN MG/24 HOURS) 14 mg/24hr patch Place 1 patch (14 mg total) onto the skin daily. 28 patch 0  . polyethylene glycol (MIRALAX / GLYCOLAX) packet Take 17 g by mouth daily as needed for mild constipation. 14 each 0  . predniSONE (DELTASONE) 20 MG tablet Take 2 tablets (40 mg total) by mouth daily with breakfast. 4 tablet 0  . SYMBICORT 160-4.5 MCG/ACT inhaler Inhale  1 puff into the lungs daily.  11  . tiotropium (SPIRIVA HANDIHALER) 18 MCG inhalation capsule Place 1 capsule (18 mcg total) into inhaler and inhale daily. 30 capsule 12   No current facility-administered medications for this visit.   Family History  Problem Relation Age of Onset  . Heart disease Mother 68    Deceased from MI at 55yo  . Hypertension Mother   . Heart disease Father 13    Deceased of MI age 9yo  . Hypertension Father   . Hypertension Brother   . Lung cancer      Grandmother   History   Social History  . Marital Status: Single    Spouse Name: N/A    Number of Children: N/A  . Years of Education: N/A   Social History Main Topics  . Smoking status: Current Every Day Smoker -- 0.50 packs/day for 40 years    Types: Cigarettes  . Smokeless tobacco: Never Used     Comment: 1/2 pack or less. Wants to try the patches.  . Alcohol Use: No  . Drug Use: No  . Sexual Activity: None   Other Topics Concern  . None   Social History Narrative   Formerly worked as a Scientist, water quality, now disabled.   Divorced.   2 grown children.   Lives with her grandson.   Review of Systems: A 12 point ROS was performed; pertinent positives and negatives are noted below.  +malaise, fevers, chills, SOB, DOE, peripheral edema, or difficulty ambulating due to DOE.  Objective:  Physical Exam: Filed Vitals:   04/10/14 1350  BP: 113/68  Pulse: 88  Temp: 98 F (36.7 C)  TempSrc: Oral  Height: _0  (1.651 m)  Weight: 256 lb 14.4 oz (116.529 kg)  SpO2: 91%   Constitutional: Vital signs reviewed.  Patient is a well-developed and well-nourished female in moderate distress. Alert and oriented x3.  Head: Normocephalic and atraumatic Eyes: EOMI Neck: Supple, Trachea midline, normal ROM Cardiovascular: RRR, no MRG Pulmonary/Chest: Increased work of breathing. Poor air movement, esp at the bases. Abdominal: Soft. Non-tender, non-distended Musculoskeletal: Moves all 4 extremities Hematology:  No cervical adenopathy.  Neurological: A&O x3, cranial nerve II-XII are grossly intact, no focal motor deficit.  Skin: Scattered ecchymoses on the forearms Psychiatric: Appears ill. Speech and behavior normal for mood.   Assessment & Plan:   Please refer to Problem List based Assessment and Plan

## 2014-04-10 NOTE — H&P (Addendum)
Date: 04/10/2014               Patient Name:  [name] MRN: [id number]  DOB: 1955-06-29 Age / Sex: 59 y.o., female   PCP: Axel Filler, MD         Medical Service: Internal Medicine Teaching Service         Attending Physician: Dr. Sid Falcon, MD    First Contact: Dr. Charlott Rakes Pager: [phone]  Second Contact: Dr. Bing Neighbors Pager: [phone]       After Hours (After 5p/  First Contact Pager: [phone]  weekends / holidays): Second Contact Pager: [phone]   Chief Complaint: shortness of breath  History of Present Illness: [name] is a 59 year old female with COPD (home O2 4L), pulmonary HTN, CAD s/p stent, CHF (EF 55-60%),  who presented to clinic with shortness of breath.   On Tuesday, she was at her friend's house when she was washing clothes and first noted feeling short of breath. She reports that there are dogs there which may have affected her breathing and feels that she is not back at baseline. She reports having a temperature of 102-103F on Saturday but denies any sick contacts. Her shortness of breath has been associated with a cough productive of white-green mucus but no blood. She reports taking her COPD medications regularly [Spiriva in the morning, Advair in the morning & evening, Symbicort once a day, albuterol 0-3 puffs/day] though reports stopping Lasix for the last two weeks since she felt her ankles had shrunk; she reports her pharmacist told her that she may not need to take Lasix daily and has not weighed herself in over 2 years since she bought a scale for herself at home. Other than eating a bag of potato chips yesterday, she denies any change to PO intake, nausea, vomiting, diarrhea, wheezing, increased oxygen requirement.   She has been hospitalized four times in the last six months for acute-on-chronic hypercapnic respiratory failure, the most recent of which was 12/15-12/17 and was discharged on a five-day course of prednisone 89m and doxycycline  1079mBID which she reports completing.      Meds: Current Facility-Administered Medications  Medication Dose Route Frequency Provider Last Rate Last Dose  . 0.9 %  sodium chloride infusion  250 mL Intravenous PRN SoBlain PaisMD      . acetaminophen (TYLENOL) tablet 650 mg  650 mg Oral Q6H PRN SoBlain PaisMD   650 mg at 04/10/14 1845   Or  . acetaminophen (TYLENOL) suppository 650 mg  650 mg Rectal Q6H PRN SoBlain PaisMD      . albuterol (PROVENTIL) (2.5 MG/3ML) 0.083% nebulizer solution 2.5 mg  2.5 mg Nebulization Daily PRN SoBlain PaisMD      . albuterol (PROVENTIL) (2.5 MG/3ML) 0.083% nebulizer solution 3 mL  3 mL Inhalation TID PRN SoBlain PaisMD      . antiseptic oral rinse (CPC / CETYLPYRIDINIUM CHLORIDE 0.05%) solution 7 mL  7 mL Mouth Rinse BID EmSid FalconMD      . budesonide-formoterol (SYMBICORT) 160-4.5 MCG/ACT inhaler 1 puff  1 puff Inhalation Daily SoBlain PaisMD      . [SDerrill MemoN 04/11/2014] docusate sodium (COLACE) capsule 100 mg  100 mg Oral Daily SoBlain PaisMD      . heparin injection 5,000 Units  5,000 Units Subcutaneous 3 times per day SoBlain PaisMD      .  nicotine (NICODERM CQ - dosed in mg/24 hours) patch 14 mg  14 mg Transdermal Daily Blain Pais, MD      . ondansetron [facility]) tablet 4 mg  4 mg Oral Q6H PRN Blain Pais, MD       Or  . ondansetron (ZOFRAN) injection 4 mg  4 mg Intravenous Q6H PRN Blain Pais, MD      . pravastatin (PRAVACHOL) tablet 20 mg  20 mg Oral q1800 Blain Pais, MD      . sodium chloride 0.9 % injection 3 mL  3 mL Intravenous Q12H Blain Pais, MD      . sodium chloride 0.9 % injection 3 mL  3 mL Intravenous Q12H Blain Pais, MD      . sodium chloride 0.9 % injection 3 mL  3 mL Intravenous PRN Blain Pais, MD      . tiotropium (SPIRIVA) inhalation capsule 18 mcg  18 mcg Inhalation Daily Blain Pais, MD         Allergies: Allergies as of 04/10/2014 - Review Complete 04/10/2014  Allergen Reaction Noted  . Fluconazole Anaphylaxis, Itching, and Swelling    Past Medical History  Diagnosis Date  . Coronary artery disease     S/P PCI of LAD with DES (12/2008). Total occlusion of RCA noted at that time., medically managed. ACS ruled out 03/2009 with Lexiscan myoview . Followed by [place].  . Pulmonary hypertension     2-D Echo (33/0076) - Systolic pressure was moderately increased. PA peak pressure  69mHg. 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 Followed by Dr. WJoya Gaskins(Velora Heckler  . Diastolic dysfunction     2-D Echo (12/2008) - Normal LV Systolic funciton with EF 60-65%. Grade 1 diastolid dysfunction. No regional wall motion abnormalities. Moderate pulmonary HTN with PA peak pressure 512mg.  . Marland KitchenOPD (chronic obstructive pulmonary disease)     Severe. Gold Stage IV.  PFTs (12/2008) - severe obstructive airway disease. Active tobacco use. Requires 4L O2 at home.  . Pulmonary nodule, right     Small right middle lobe nodule. Stable as of 12/2008.  . Marland Kitchenleep apnea     Presumptive. No documented sleep studies.   . Prediabetes 12/2008    HgbA1c 6.4 (12/2008)  . Hx MRSA infection     Recurrent MRSA thigh abscesses.  . Tobacco abuse     Ongoing.  . Obesity   . Hyperlipidemia   . GERD (gastroesophageal reflux disease)     S/P Nissen fundoplication.  . Shortness of breath   . CHF (congestive heart failure)    Past Surgical History  Procedure Laterality Date  . Total abdominal hysterectomy w/ bilateral salpingoophorectomy    . Nissen fundoplication    . Right heart catheterization N/A 11/09/2012    Procedure: RIGHT HEART CATH;  Surgeon: DaLarey DresserMD;  [facility];  Service: Cardiovascular;  Laterality: N/A;   Family History  Problem Relation Age of Onset  . Heart disease  Mother 4724  Deceased from MI at 60yo. Hypertension Mother   . Heart disease Father 5413  Deceased of MI age 60yo. Hypertension Father   . Hypertension Brother   . Lung cancer      Grandmother   History   Social History  . Marital Status: Single    Spouse Name: N/A  Number of Children: N/A  . Years of Education: N/A   Occupational History  . Not on file.   Social History Main Topics  . Smoking status: Current Every Day Smoker -- 0.50 packs/day for 40 years    Types: Cigarettes  . Smokeless tobacco: Never Used     Comment: 1/2 pack or less. Wants to try the patches.  . Alcohol Use: No  . Drug Use: No  . Sexual Activity: Not on file   Other Topics Concern  . Not on file   Social History Narrative   Formerly worked as a Scientist, water quality, now disabled.   Divorced.   2 grown children.   Lives with her grandson.    Review of Systems: Review of Systems  Constitutional: Positive for fever.  Respiratory: Positive for cough, sputum production and shortness of breath. Negative for hemoptysis and wheezing.   Cardiovascular: Positive for chest pain.       Localized to R side, stable from last discharge, feels it might be related to indigestion  Gastrointestinal: Positive for constipation. Negative for nausea, vomiting, abdominal pain and diarrhea.       Last BM 6 days ago       Physical Exam: Blood pressure 119/67, pulse 87, temperature 98.2 F (36.8 C), temperature source Oral, resp. rate 18, height _0  (1.651 m), weight 251 lb 3.2 oz (113.944 kg), SpO2 89 %.  General: resting in bed, 4L O2 by  HEENT: PERRL, EOMI, no scleral icterus, oropharynx clear Cardiac: RRR, no rubs, murmurs or gallops Pulm: limited airflow bilaterally, no wheezes, rales, or rhonchi Abd: soft, nontender, nondistended, BS present, bruising present in RLQ (reports from prior hospitalization) Ext: warm and well perfused, 1-2+ pitting edema Neuro: alert and oriented X3, cranial nerves II-XII  grossly intact, strength and sensation to light touch equal in bilateral upper and lower extremities   Lab results: Basic Metabolic Panel:  Recent Labs  04/10/14 1542  NA 141  K 4.3  CL 92*  CO2 43*  GLUCOSE 92  BUN 5*  CREATININE 0.80  CALCIUM 9.0   Liver Function Tests:  Recent Labs  04/10/14 1542  AST 15  ALT 10  ALKPHOS 106  BILITOT 0.7  PROT 6.3  ALBUMIN 3.3*   CBC:  Recent Labs  04/10/14 1542  WBC 7.5  NEUTROABS 4.9  HGB 14.1  HCT 46.0  MCV 100.2*  PLT 146*    Imaging results:  X-ray Chest Pa And Lateral  04/10/2014   CLINICAL DATA:  Acute onset of dyspnea. Chronic shortness of breath. Initial encounter.  EXAM: CHEST  2 VIEW  COMPARISON:  Chest radiograph performed 03/18/2014  FINDINGS: Vascular congestion is noted, with bibasilar opacities, concerning for minimal interstitial edema. No definite pleural effusion or pneumothorax is seen.  The cardiomediastinal silhouette is mildly enlarged. No acute osseous abnormalities are identified. Postoperative change is noted at the upper abdomen.  IMPRESSION: Vascular congestion and mild cardiomegaly, with bibasilar airspace opacities, concerning for minimal interstitial edema.   Electronically Signed   By: Garald Balding M.D.   On: 04/10/2014 18:01    Other results: EKG: Reviewed and compared with 03/19/14 Normal sinus rhythm, axis aVF, V4: T wave flattening   Assessment & Plan by Problem:  Acute on chronic respiratory failure: Likely acute CHF exacerbation though may be multifactorial in etiology as she has pulmonary HTN likely to have gotten worse in the setting on missed Lasix concomitant (as suggested by CXR findings though BNP 172) with possible infection though  no infiltrate seen on CXR. ABG with CO2 79, improved from prior values in the 80s-90s.  -Check viral panel & continue droplet precautions -Continue home O2 with goal 88-92% -Order sputum culture -Check troponins x 3 -Give Lasix 68m IV tomorrow  AM -Give albuterol nebs prn for dyspnea -Give Symbicort 1 puff QD  -Give Spiriva   Atypical chest pain: Does not appear to be acute in nature as it's been present since her last hospitalization. Cardiac less likely with troponin negative x 1, EKG with mostly stable findings. GI pathology suspect given it's report as indigestion and prior GI procedure. MSK also possible given her large body habitus though unsure why it wouldn't have resolved.  -Continue reassessing tomorrow  Tobacco abuse: Reports smoking 3-4 cigarettes/day.  -Give nicotine 14g/patch -Recommend cessation  CHF: Unsure of her dry weight though has been 240-250 lbs over the past year per chart review. Currently 251 lbs. -Lasix as noted above  Constipation: Give Colace 107m   Hyperlipidemia: Continue home statin.   #FEN:  -Diet: 2g Na  #DVT prophylaxis: heparin 5000 units subcutaneous  #CODE STATUS: FULL CODE -Defer to daughter HeNira Conn son MiLegrand Comof patients lacks decision-making capacity -Confirmed with patient on admission      Dispo: Disposition is deferred at this time, awaiting improvement of current medical problems.   The patient does have a current PCP (JAxel FillerMD) and [facility].  The patient does have transportation limitations that hinder transportation to clinic appointments.  Signed: RuCharlott RakesMD 04/10/2014, 6:53 PM

## 2014-04-10 NOTE — Progress Notes (Signed)
Internal Medicine Clinic Attending  Case discussed with Dr. Glenn at the time of the visit.  We reviewed the resident's history and exam and pertinent patient test results.  I agree with the assessment, diagnosis, and plan of care documented in the resident's note.  

## 2014-04-11 DIAGNOSIS — Z720 Tobacco use: Secondary | ICD-10-CM | POA: Diagnosis not present

## 2014-04-11 DIAGNOSIS — Z8249 Family history of ischemic heart disease and other diseases of the circulatory system: Secondary | ICD-10-CM | POA: Diagnosis not present

## 2014-04-11 DIAGNOSIS — Z883 Allergy status to other anti-infective agents status: Secondary | ICD-10-CM | POA: Diagnosis not present

## 2014-04-11 DIAGNOSIS — K5900 Constipation, unspecified: Secondary | ICD-10-CM | POA: Diagnosis present

## 2014-04-11 DIAGNOSIS — I5033 Acute on chronic diastolic (congestive) heart failure: Principal | ICD-10-CM

## 2014-04-11 DIAGNOSIS — Z90710 Acquired absence of both cervix and uterus: Secondary | ICD-10-CM | POA: Diagnosis not present

## 2014-04-11 DIAGNOSIS — J449 Chronic obstructive pulmonary disease, unspecified: Secondary | ICD-10-CM

## 2014-04-11 DIAGNOSIS — M940 Chondrocostal junction syndrome [Tietze]: Secondary | ICD-10-CM

## 2014-04-11 DIAGNOSIS — F17210 Nicotine dependence, cigarettes, uncomplicated: Secondary | ICD-10-CM

## 2014-04-11 DIAGNOSIS — R0600 Dyspnea, unspecified: Secondary | ICD-10-CM | POA: Diagnosis present

## 2014-04-11 DIAGNOSIS — Z955 Presence of coronary angioplasty implant and graft: Secondary | ICD-10-CM | POA: Diagnosis not present

## 2014-04-11 DIAGNOSIS — I2510 Atherosclerotic heart disease of native coronary artery without angina pectoris: Secondary | ICD-10-CM | POA: Diagnosis present

## 2014-04-11 DIAGNOSIS — E785 Hyperlipidemia, unspecified: Secondary | ICD-10-CM | POA: Diagnosis present

## 2014-04-11 DIAGNOSIS — R0789 Other chest pain: Secondary | ICD-10-CM | POA: Diagnosis present

## 2014-04-11 DIAGNOSIS — I10 Essential (primary) hypertension: Secondary | ICD-10-CM | POA: Diagnosis present

## 2014-04-11 DIAGNOSIS — Z9114 Patient's other noncompliance with medication regimen: Secondary | ICD-10-CM | POA: Diagnosis present

## 2014-04-11 DIAGNOSIS — Z9981 Dependence on supplemental oxygen: Secondary | ICD-10-CM | POA: Diagnosis not present

## 2014-04-11 DIAGNOSIS — I272 Other secondary pulmonary hypertension: Secondary | ICD-10-CM | POA: Diagnosis present

## 2014-04-11 LAB — RESPIRATORY VIRUS PANEL
Adenovirus: NOT DETECTED
INFLUENZA A H3: NOT DETECTED
Influenza A H1: NOT DETECTED
Influenza A: NOT DETECTED
Influenza B: NOT DETECTED
METAPNEUMOVIRUS: NOT DETECTED
PARAINFLUENZA 1 A: NOT DETECTED
PARAINFLUENZA 3 A: NOT DETECTED
Parainfluenza 2: NOT DETECTED
Respiratory Syncytial Virus A: NOT DETECTED
Respiratory Syncytial Virus B: NOT DETECTED
Rhinovirus: NOT DETECTED

## 2014-04-11 MED ORDER — SENNOSIDES-DOCUSATE SODIUM 8.6-50 MG PO TABS
2.0000 | ORAL_TABLET | Freq: Every day | ORAL | Status: DC
  Administered 2014-04-11: 2 via ORAL
  Filled 2014-04-11 (×2): qty 2

## 2014-04-11 MED ORDER — FUROSEMIDE 10 MG/ML IJ SOLN
60.0000 mg | Freq: Two times a day (BID) | INTRAMUSCULAR | Status: DC
  Administered 2014-04-11 – 2014-04-12 (×2): 60 mg via INTRAVENOUS
  Filled 2014-04-11 (×3): qty 6

## 2014-04-11 MED ORDER — GUAIFENESIN-CODEINE 100-10 MG/5ML PO SOLN
5.0000 mL | Freq: Four times a day (QID) | ORAL | Status: DC | PRN
  Administered 2014-04-11: 5 mL via ORAL
  Filled 2014-04-11: qty 5

## 2014-04-11 NOTE — Plan of Care (Signed)
Problem: Phase I Progression Outcomes Goal: EF % per last Echo/documented,Core Reminder form on chart Outcome: Progressing EF is 55-60% as of 11/2012

## 2014-04-11 NOTE — Progress Notes (Signed)
  Date: 04/11/2014  Patient name: ERCELL PERLMAN  Medical record number: [id number]  Date of birth: 12-14-1955   I have seen and evaluated Michelene Gardener and discussed their care with the Residency Team.  Briefly, Ms. Tinner is a 59yo woman with pMH of COPD on home O2, HTN, CAD, CHF who presented to the clinic with SOB.  She has noted the symptoms starting on Tuesday when she was at a friends house.  She further notes a fever as far back as Saturday, however, this has not recurred.  She has a cough with white sputum and has been taking her medications as prescribed for her COPD.  She does take both advair and symbicort.  She notes she had been exposed to dog dander, but had no sick contacts.  She previously had been on lasix for 2nd pHTN and dCHF, however, she stopped taking this medication 2 weeks ago b/c it made her require the restroom too often and limited her lifestyle.  She has not noticed any weight gain, but does not weigh herself.  She further notes chronic chest pain on the right side of her chest which is reproducible, over the ribs, worse with movement and coughing.  She has had multiple recent admissions for acute on chronic respiratory failure, most recently in December.  On exam, she appeared like she felt unwell, but objectively there was not much concerning.  She was moving well in bed, RR, NR, no murmur noted.  She had limited airflow in the lungs, however, no wheezing or rales (I expect this is normal for her), she has some mild edema to bilateral LE to the calves.  She did have a dry cough while we were in there.  Her WBC was normal. CXR done showed minimal interstitial edema.  Assessment and Plan: I have seen and evaluated the patient as outlined above. I agree with the formulated Assessment and Plan as detailed in the residents' admission note, with the following changes:   1. Acute on chronic diastolic HF - I would anticipate this is her main problem and contributing to her SOB.  She may also  have an underlying viral URI that is worsening her course and affecting her feelings of fatigue.  She is not requiring more O2 or breathing treatments and is not wheezing, so I am not concerned about a COPD exacerbation at this time.  - Would continue her chronic medications for COPD - Lasix IV X 1 and restart home dose.  If her symptoms improve, it would be prudent to get her on a home dose which she can tolerate, as her previous dose was lifestyle limiting.  It will also be necessary to educate her on the need for this medication.  - Agree with checking viral panel and flu panel - Home O2 - If sputum, collect for culture  2. Atypical chest pain, likely costochondritis - Symptoms consistent, try voltaren gel - CE X 3 negative, not consistent with ACS  3. Tobacco abuse - Advise cessation, particularly in the setting of oxygen use - Nicotine patch  4. Constipation - Colace, consider senna or sorbitol as needed  Other issues are chronic and noted in Dr. Serita Grit H&P.  Sid Falcon, MD 1/8/20161:27 PM

## 2014-04-11 NOTE — Progress Notes (Addendum)
Subjective: This AM, she was sleeping on exam. She feels about the same as yesterday though reports wanting to lay down anytime she sits up as it makes her a little dizzy. For her home meds, she reports preferring Advair over Symbicort in terms of ease of use.    Objective: Vital signs in last 24 hours: Filed Vitals:   04/11/14 0150 04/11/14 0614 04/11/14 0730 04/11/14 1121  BP: 129/54 135/63  110/61  Pulse: 87 95  83  Temp: 98.6 F (37 C) 98.1 F (36.7 C)    TempSrc: Oral Oral    Resp: 20 20    Height:      Weight:  250 lb (113.399 kg)    SpO2: 90% 87% 88%    Weight change:   Intake/Output Summary (Last 24 hours) at 04/11/14 1236 Last data filed at 04/11/14 1124  Gross per 24 hour  Intake   1183 ml  Output   1900 ml  Net   -717 ml   General: resting in bed, NAD HEENT: PERRL, EOMI, no scleral icterus, oropharynx clear Cardiac: RRR, no rubs, murmurs or gallops, tenderness to palpation of R chest wall just medial to R breast Pulm: clear to auscultation bilaterally, no wheezes, rales, or rhonchi Abd: soft, nontender, nondistended, BS present Ext: warm and well perfused, 1-2+ pretibial edema, stable from yesterday Neuro: responds to questions appropriately; moving all extremities freely    Lab Results: Basic Metabolic Panel:  Recent Labs Lab 04/10/14 1542  NA 141  K 4.3  CL 92*  CO2 43*  GLUCOSE 92  BUN 5*  CREATININE 0.80  CALCIUM 9.0   Liver Function Tests:  Recent Labs Lab 04/10/14 1542  AST 15  ALT 10  ALKPHOS 106  BILITOT 0.7  PROT 6.3  ALBUMIN 3.3*   CBC:  Recent Labs Lab 04/10/14 1542  WBC 7.5  NEUTROABS 4.9  HGB 14.1  HCT 46.0  MCV 100.2*  PLT 146*   Cardiac Enzymes:  Recent Labs Lab 04/10/14 1815  TROPONINI <0.03   Urine Drug Screen: Drugs of Abuse     Component Value Date/Time   LABOPIA NONE DETECTED 07/01/2013 1218   [place] 02/04/2009 2315   COCAINSCRNUR NONE DETECTED 07/01/2013 [place]  NEGATIVE 02/04/2009 2315   LABBENZ NONE DETECTED 07/01/2013 1218   LABBENZ NEGATIVE 02/04/2009 2315   AMPHETMU NONE DETECTED 07/01/2013 1218   AMPHETMU NEGATIVE 02/04/2009 2315   THCU NONE DETECTED 07/01/2013 1218   LABBARB NONE DETECTED 07/01/2013 1218      Micro Results: Recent Results (from the past 240 hour(s))  MRSA PCR Screening     Status: None   Collection Time: 04/10/14  6:50 PM  Result Value Ref Range Status   MRSA by PCR NEGATIVE NEGATIVE Final    Comment:        The GeneXpert MRSA Assay (FDA approved for NASAL specimens only), is one component of a comprehensive MRSA colonization surveillance program. It is not intended to diagnose MRSA infection nor to guide or monitor treatment for MRSA infections.    Studies/Results: X-ray Chest Pa And Lateral  04/10/2014   CLINICAL DATA:  Acute onset of dyspnea. Chronic shortness of breath. Initial encounter.  EXAM: CHEST  2 VIEW  COMPARISON:  Chest radiograph performed 03/18/2014  FINDINGS: Vascular congestion is noted, with bibasilar opacities, concerning for minimal interstitial edema. No definite pleural effusion or pneumothorax is seen.  The cardiomediastinal silhouette is mildly enlarged. No acute osseous abnormalities are identified. Postoperative change  is noted at the upper abdomen.  IMPRESSION: Vascular congestion and mild cardiomegaly, with bibasilar airspace opacities, concerning for minimal interstitial edema.   Electronically Signed   By: Garald Balding M.D.   On: 04/10/2014 18:01   Medications: I have reviewed the patient's current medications. Scheduled Meds: . antiseptic oral rinse  7 mL Mouth Rinse BID  . budesonide-formoterol  1 puff Inhalation Daily  . docusate sodium  100 mg Oral Daily  . furosemide  40 mg Intravenous Q12H  . heparin  5,000 Units Subcutaneous 3 times per day  . nicotine  14 mg Transdermal Daily  . pravastatin  20 mg Oral q1800  . sodium chloride  3 mL Intravenous Q12H  . sodium chloride  3  mL Intravenous Q12H  . tiotropium  18 mcg Inhalation Daily   Continuous Infusions:  PRN Meds:.sodium chloride, acetaminophen **OR** acetaminophen, albuterol, ondansetron **OR** ondansetron (ZOFRAN) IV, sodium chloride Assessment/Plan: Acute on chronic heart failure: Likely 2/2 Lasix non-adherence with possible infection though no infiltrate seen on CXR.   -Check viral panel & continue droplet precautions -Continue home O2 with goal 88-92% -Order sputum culture -Increase to Lasix 81m IV BID -Give albuterol nebs prn for dyspnea -Give Symbicort 1 puff QD  -Give Spiriva   Atypical chest pain: MSK given reproducibility and location given the strain on her chest wall she is laying down on her side.  -Continue assessing to see how bothersome it truly is and whether or not she needs medication  Tobacco abuse: Reports smoking 3-4 cigarettes/day.  -Give nicotine 14g/patch -Recommend cessation  CHF: Wt 250lbs, down from 251 yesterday. Unsure of her dry weight though has been 240-250 lbs over the past year per chart review.  -Lasix as noted above  Constipation: Continue Colace 1033mand reassess.   Hyperlipidemia: Continue home statin.   #FEN:  -Diet: 2g Na  #DVT prophylaxis: heparin 5000 units subcutaneous  #CODE STATUS: FULL CODE -Defer to daughter HeNira Conn son MiLegrand Comof patients lacks decision-making capacity -Confirmed with patient on admission  Dispo: Disposition is deferred at this time, awaiting improvement of current medical problems.    The patient does have a current PCP (JAxel FillerMD) and [facility].  The patient does have transportation limitations that hinder transportation to clinic appointments.  .Services Needed at time of discharge: Y = Yes, Blank = No PT:   OT:   RN:   Equipment:   Other:     LOS: 1 day   RuCharlott RakesMD 04/11/2014, 12:36 PM

## 2014-04-11 NOTE — Progress Notes (Signed)
Respiratory panel came back negative for flu or upper respiratory virus.

## 2014-04-11 NOTE — Progress Notes (Signed)
Patient is currently active with [facility] for chronic disease management services.  Patient has been engaged by a [employer].  Our community based plan of care has focused on disease management of COPD/Smoking cessation and community resource support.  Patient will receive a post discharge transition of care call and will be evaluated for monthly home visits for assessments and disease process education.  Made Inpatient Case Manager aware that [facility] [facility] following. Of note, [facility] Care Management services does not replace or interfere with any services that are arranged by inpatient case management or social work.  For additional questions or referrals please contact Natividad Brood, RN, BSN, CCM, [facility] Liaison at [phone].

## 2014-04-11 NOTE — Progress Notes (Signed)
Notified on-call doctor of patient requesting something for coughing and chest pain from coughing so much. Orders given and will follow through with orders. Will continue to monitor patient to end of shift.

## 2014-04-11 NOTE — Care Management Note (Unsigned)
    Page 1 of 1   04/11/2014     4:46:15 PM CARE MANAGEMENT NOTE 04/11/2014  Patient:  [name], [name]   Account Number:  [id number]  Date Initiated:  04/11/2014  Documentation initiated by:  Harry Shuck  Subjective/Objective Assessment:   Pt adm on 04/10/13 with CHF exacerbation.  PTA, pt resides at home with grandson, roommate and her husband.  She states she is quite independent normally.  She is on chronic home oxygen,provided by [facility].     Action/Plan:   Pt denies any needs for home currently.  Will cont to follow for dc needs as pt progresses.   Anticipated DC Date:  04/14/2014   Anticipated DC Plan:  [place]  CM consult      Choice offered to / List presented to:             Status of service:  In process, will continue to follow Medicare Important Message given?   (If response is "NO", the following Medicare IM given date fields will be blank) Date Medicare IM given:   Medicare IM given by:   Date Additional Medicare IM given:   Additional Medicare IM given by:    Discharge Disposition:    Per UR Regulation:  Reviewed for med. necessity/level of care/duration of stay  If discussed at [place] of Stay Meetings, dates discussed:    Comments:

## 2014-04-11 NOTE — Progress Notes (Signed)
UR completed.

## 2014-04-12 LAB — BASIC METABOLIC PANEL
Anion gap: 7 (ref 5–15)
BUN: 11 mg/dL (ref 6–23)
CHLORIDE: 88 meq/L — AB (ref 96–112)
CO2: 44 mmol/L — AB (ref 19–32)
Calcium: 8.9 mg/dL (ref 8.4–10.5)
Creatinine, Ser: 0.74 mg/dL (ref 0.50–1.10)
GFR calc Af Amer: >90 mL/min (ref >90)
GFR calc non Af Amer: >90 mL/min (ref >90)
Glucose, Bld: 156 mg/dL — ABNORMAL HIGH (ref 70–99)
Potassium: 3.6 mmol/L (ref 3.5–5.1)
SODIUM: 139 mmol/L (ref 135–145)

## 2014-04-12 MED ORDER — FUROSEMIDE 20 MG PO TABS
40.0000 mg | ORAL_TABLET | Freq: Two times a day (BID) | ORAL | Status: DC
Start: 2014-04-12 — End: 2014-05-27

## 2014-04-12 MED ORDER — SENNOSIDES-DOCUSATE SODIUM 8.6-50 MG PO TABS: 2.0000 | ORAL_TABLET | Freq: Every day | ORAL | Status: DC

## 2014-04-12 MED ORDER — DICLOFENAC SODIUM 1 % TD GEL
2.0000 g | Freq: Four times a day (QID) | TRANSDERMAL | Status: DC
  Administered 2014-04-12: 2 g via TOPICAL
  Filled 2014-04-12: qty 100

## 2014-04-12 MED ORDER — DICLOFENAC SODIUM 1 % TD GEL: 2.0000 g | Freq: Four times a day (QID) | TRANSDERMAL | Status: DC

## 2014-04-12 MED ORDER — POLYETHYLENE GLYCOL 3350 17 G PO PACK: 17.0000 g | PACK | Freq: Every day | ORAL | Status: DC

## 2014-04-12 NOTE — Progress Notes (Signed)
Subjective: Pt asking when she can go home, she says she is not sure about why she was admitted in the first place. She feels okay. Back to her baseline. She has put out lot of urine.   Objective: Vital signs in last 24 hours: Filed Vitals:   04/11/14 1714 04/11/14 2204 04/12/14 0713 04/12/14 0723  BP: 128/91 110/58 96/62   Pulse: 82 81 92   Temp:  98.6 F (37 C) 98.5 F (36.9 C)   TempSrc:  Oral    Resp:  18    Height:      Weight:    248 lb (112.492 kg)  SpO2:  94%     Weight change:   Intake/Output Summary (Last 24 hours) at 04/12/14 1041 Last data filed at 04/12/14 0909  Gross per 24 hour  Intake   2295 ml  Output   3950 ml  Net  -1655 ml   General: resting in bed, NAD HEENT: PERRL, EOMI, no scleral icterus, oropharynx clear. Cardiac: RRR, no rubs, murmurs or gallops, minimal tenderness to palpation of R chest wall just medial to R breast Pulm: clear to auscultation bilaterally, no wheezes, rales, or rhonchi Abd: soft, nontender, nondistended, BS present Ext: warm and well perfused, 1-2+ pretibial edema, improved from yesterday Neuro: responds to questions appropriately; moving all extremities freely  Lab Results: Basic Metabolic Panel:  Recent Labs Lab 04/10/14 1542 04/12/14 0333  NA 141 139  K 4.3 3.6  CL 92* 88*  CO2 43* 44*  GLUCOSE 92 156*  BUN 5* 11  CREATININE 0.80 0.74  CALCIUM 9.0 8.9   Liver Function Tests:  Recent Labs Lab 04/10/14 1542  AST 15  ALT 10  ALKPHOS 106  BILITOT 0.7  PROT 6.3  ALBUMIN 3.3*   CBC:  Recent Labs Lab 04/10/14 1542  WBC 7.5  NEUTROABS 4.9  HGB 14.1  HCT 46.0  MCV 100.2*  PLT 146*   Cardiac Enzymes:  Recent Labs Lab 04/10/14 1815  TROPONINI <0.03   Urine Drug Screen: Drugs of Abuse     Component Value Date/Time   LABOPIA NONE DETECTED 07/01/2013 1218   [place] 02/04/2009 2315   [place] DETECTED 07/01/2013 [place] NEGATIVE 02/04/2009 2315   LABBENZ NONE  DETECTED 07/01/2013 1218   LABBENZ NEGATIVE 02/04/2009 2315   AMPHETMU NONE DETECTED 07/01/2013 1218   AMPHETMU NEGATIVE 02/04/2009 2315   THCU NONE DETECTED 07/01/2013 1218   [place] DETECTED 07/01/2013 1218      Micro Results: Recent Results (from the past 240 hour(s))  MRSA PCR Screening     Status: None   Collection Time: 04/10/14  6:50 PM  Result Value Ref Range Status   MRSA by PCR NEGATIVE NEGATIVE Final    Comment:        The GeneXpert MRSA Assay (FDA approved for NASAL specimens only), is one component of a comprehensive MRSA colonization surveillance program. It is not intended to diagnose MRSA infection nor to guide or monitor treatment for MRSA infections.   Respiratory virus panel (routine influenza)     Status: None   Collection Time: 04/11/14 12:33 AM  Result Value Ref Range Status   Source - RVPAN NASOPHARYNGEAL  Final   Respiratory Syncytial Virus A NOT DETECTED  Final   Respiratory Syncytial Virus B NOT DETECTED  Final   Influenza A NOT DETECTED  Final   Influenza B NOT DETECTED  Final   Parainfluenza 1 NOT DETECTED  Final  Parainfluenza 2 NOT DETECTED  Final   Parainfluenza 3 NOT DETECTED  Final   Metapneumovirus NOT DETECTED  Final   Rhinovirus NOT DETECTED  Final   Adenovirus NOT DETECTED  Final   Influenza A H1 NOT DETECTED  Final   Influenza A H3 NOT DETECTED  Final    Comment: (NOTE)       Normal Reference Range for each Analyte: NOT DETECTED Testing performed using the Luminex xTAG Respiratory Viral Panel test kit. The analytical performance characteristics of this assay have been determined by [employer].  The modifications have not been cleared or approved by the FDA. This assay has been validated pursuant to the CLIA regulations and is used for clinical purposes. Performed at [employer]    Studies/Results: X-ray Chest Pa And Lateral  04/10/2014   CLINICAL DATA:  Acute onset of dyspnea. Chronic shortness of  breath. Initial encounter.  EXAM: CHEST  2 VIEW  COMPARISON:  Chest radiograph performed 03/18/2014  FINDINGS: Vascular congestion is noted, with bibasilar opacities, concerning for minimal interstitial edema. No definite pleural effusion or pneumothorax is seen.  The cardiomediastinal silhouette is mildly enlarged. No acute osseous abnormalities are identified. Postoperative change is noted at the upper abdomen.  IMPRESSION: Vascular congestion and mild cardiomegaly, with bibasilar airspace opacities, concerning for minimal interstitial edema.   Electronically Signed   By: Garald Balding M.D.   On: 04/10/2014 18:01   Medications: I have reviewed the patient's current medications. Scheduled Meds: . antiseptic oral rinse  7 mL Mouth Rinse BID  . budesonide-formoterol  1 puff Inhalation Daily  . diclofenac sodium  2 g Topical QID  . heparin  5,000 Units Subcutaneous 3 times per day  . nicotine  14 mg Transdermal Daily  . pravastatin  20 mg Oral q1800  . senna-docusate  2 tablet Oral QHS  . sodium chloride  3 mL Intravenous Q12H  . sodium chloride  3 mL Intravenous Q12H  . tiotropium  18 mcg Inhalation Daily   Continuous Infusions:  PRN Meds:.sodium chloride, acetaminophen **OR** acetaminophen, albuterol, guaiFENesin-codeine, ondansetron **OR** ondansetron (ZOFRAN) IV, sodium chloride   Assessment/Plan: Acute on chronic heart failure: Likely 2/2 Lasix non-adherence with possible infection though no infiltrate seen on CXR.  - Stable for discharge home today, to continue lasix at home- 8m BID, start tomorrow.   - respiratory viral panel neg -Continue home O2 with goal 88-92% -Give albuterol nebs prn for dyspnea -Give Symbicort 1 puff QD  -Give Spiriva   Atypical chest pain: MSK given reproducibility and location given the strain on her chest wall she is laying down on her side.  -Voltaren gel  Tobacco abuse: Reports smoking 3-4 cigarettes/day.  -Give nicotine 14g/patch -Recommend  cessation  CHF: Wt 248 lbs, down from 251 on admission. Weight appears stable over the past year.  -Lasix as noted above  Constipation: Continue Colace 1011m Hyperlipidemia: Continue home statin.   #FEN:  -Diet:   #DVT prophylaxis: heparin 5000 units subcutaneous  #CODE STATUS: FULL CODE -Defer to daughter HeNira Conn son MiLegrand Comof patients lacks decision-making capacity -Confirmed with patient on admission  Dispo: Disposition is deferred at this time, awaiting improvement of current medical problems.    The patient does have a current PCP (JAxel FillerMD) and [facility].  The patient does have transportation limitations that hinder transportation to clinic appointments.  .Services Needed at time of discharge: Y = Yes, Blank =  No PT:   OT:   RN:   Equipment:   Other:     LOS: 2 days   Bethena Roys, MD 04/12/2014, 10:41 AM

## 2014-04-12 NOTE — Plan of Care (Signed)
Problem: Phase I Progression Outcomes Goal: EF % per last Echo/documented,Core Reminder form on chart Outcome: Completed/Met Date Met:  04/12/14 EF 55-60% as of 11/2012 Goal: Up in chair, BRP Outcome: Completed/Met Date Met:  04/12/14 Patient independent in room

## 2014-04-12 NOTE — Progress Notes (Signed)
Patient discharged to home, transported by daughter.  IV removed prior to discharge; IV site clean, dry, and intact.  Discharge instructions, education, medications, and follow-up discussed with patient prior to discharge.  Patient denies any questions or concerns at this time.  Will continue to monitor.

## 2014-04-12 NOTE — Progress Notes (Signed)
Dr. Denton Brick notified as patient has not had a BM since 1/1.  Patient states she thinks she will be able to go "when I get home".  Per Dr. Denton Brick, Sennakot-S and Miralax prescribed for patient at home.  Patient updated and agreeable.

## 2014-04-14 NOTE — Discharge Summary (Signed)
Name: [name] MRN: [id number] DOB: 1956/02/02 59 y.o. PCP: Axel Filler, MD  Date of Admission: 04/10/2014  4:24 PM Date of Discharge: 04/14/2014 Attending Physician: No att. providers found  Discharge Diagnosis: Acute on chronic diastolic CHF exacerbation Atypical chest pain COPD Tobacco abuse Constipation Hyperlipidemia  Discharge Medications:   Medication List    STOP taking these medications        ADVAIR DISKUS 250-50 MCG/DOSE Aepb  Generic drug:  Fluticasone-Salmeterol     nicotine 14 mg/24hr patch  Commonly known as:  NICODERM CQ - dosed in mg/24 hours      TAKE these medications        albuterol (2.5 MG/3ML) 0.083% nebulizer solution  Commonly known as:  PROVENTIL  Take 2.5 mg by nebulization daily as needed for wheezing or shortness of breath.     albuterol 108 (90 BASE) MCG/ACT inhaler  Commonly known as:  PROVENTIL HFA;VENTOLIN HFA  Inhale 2 puffs into the lungs 3 (three) times daily as needed for wheezing or shortness of breath.     BC HEADACHE POWDER PO  Take 1 Package by mouth as needed (for headache).     diclofenac sodium 1 % Gel  Commonly known as:  VOLTAREN  Apply 2 g topically 4 (four) times daily.     furosemide 20 MG tablet  Commonly known as:  LASIX  Take 2 tablets (40 mg total) by mouth 2 (two) times daily.     lovastatin 20 MG tablet  Commonly known as:  MEVACOR  Take 1 tablet (20 mg total) by mouth at bedtime.     polyethylene glycol packet  Commonly known as:  MIRALAX / GLYCOLAX  Take 17 g by mouth daily.     senna-docusate 8.6-50 MG per tablet  Commonly known as:  Senokot-S  Take 2 tablets by mouth at bedtime.     SYMBICORT 160-4.5 MCG/ACT inhaler  Generic drug:  budesonide-formoterol  Inhale 1 puff into the lungs daily.     tiotropium 18 MCG inhalation capsule  Commonly known as:  SPIRIVA HANDIHALER  Place 1 capsule (18 mcg total) into inhaler and inhale daily.        Disposition and follow-up:   MorganMaeby  B Cortner was [facility] from [facility] in Stable condition.  At the hospital follow up visit please address:  -CHF: adherence to Lasix, weight -Constipation: tolerance of Sennokot-S -COPD: smoking cessation -Code status: 6 hospitalizations in past year, smoking & non-adherence to medication in the setting of advanced cardiopulmonary disease  2.  Labs / imaging needed at time of follow-up: none  3.  Pending labs/ test needing follow-up: none  Follow-up Appointments: Follow-up Information    Follow up with Axel Filler, MD.   Specialty:  Internal Medicine   Why:  We will call you with an appointment to follow up with [place] in clinic in a week.   Contact information:   [place] [address] [phone]       Discharge Instructions: Discharge Instructions    Diet - low sodium heart healthy    Complete by:  As directed      Discharge instructions    Complete by:  As directed   Please take all your medications as prescribed. Remember to take your Lasix, do not take it today when you go home.  Continue with 67m two times a day till you follow [facility] with [place] [facility].  You will be called with an  appointment next week.  We have added miralax once a day to help with constipation. You can get it over the counter, we sent in a prescription. Which ever one is cheaper for you.  Cont using your inhalers and work on trying to quit smoking.     Increase activity slowly    Complete by:  As directed            Consultations:    Procedures Performed:  X-ray Chest Pa And Lateral  04/10/2014   CLINICAL DATA:  Acute onset of dyspnea. Chronic shortness of breath. Initial encounter.  EXAM: CHEST  2 VIEW  COMPARISON:  Chest radiograph performed 03/18/2014  FINDINGS: Vascular congestion is noted, with bibasilar opacities, concerning for minimal interstitial edema. No definite pleural effusion or pneumothorax is seen.  The cardiomediastinal silhouette is mildly  enlarged. No acute osseous abnormalities are identified. Postoperative change is noted at the upper abdomen.  IMPRESSION: Vascular congestion and mild cardiomegaly, with bibasilar airspace opacities, concerning for minimal interstitial edema.   Electronically Signed   By: Garald Balding M.D.   On: 04/10/2014 18:01   Dg Chest 2 View  03/18/2014   CLINICAL DATA:  Shortness of breath.  EXAM: CHEST  2 VIEW  COMPARISON:  February 22, 2014.  FINDINGS: Stable cardiomegaly. Stable mild central pulmonary vascular congestion is noted. No pneumothorax or significant pleural effusion is noted. Stable bibasilar interstitial densities are noted most consistent with scarring, but superimposed pulmonary edema cannot be excluded. Bony thorax appears intact.  IMPRESSION: Stable bibasilar interstitial densities are noted most consistent with scarring, but superimposed pulmonary edema cannot be excluded. Stable mild central pulmonary vascular congestion is noted.   Electronically Signed   By: Sabino Dick M.D.   On: 03/18/2014 20:24    Admission HPI: [name] is a 59 year old female with COPD (home O2 4L), pulmonary HTN, CAD s/p stent, CHF (EF 55-60%), who presented to clinic with shortness of breath.   On Tuesday, she was at her friend's house when she was washing clothes and first noted feeling short of breath. She reports that there are dogs there which may have affected her breathing and feels that she is not back at baseline. She reports having a temperature of 102-103F on Saturday but denies any sick contacts. Her shortness of breath has been associated with a cough productive of white-green mucus but no blood. She reports taking her COPD medications regularly [Spiriva in the morning, Advair in the morning & evening, Symbicort once a day, albuterol 0-3 puffs/day] though reports stopping Lasix for the last two weeks since she felt her ankles had shrunk; she reports her pharmacist told her that she may not need to take  Lasix daily and has not weighed herself in over 2 years since she bought a scale for herself at home. Other than eating a bag of potato chips yesterday, she denies any change to PO intake, nausea, vomiting, diarrhea, wheezing, increased oxygen requirement.   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   Hospital Course by problem list:   Acute on chronic CHF: Likely 2/2 non-adherence to home Lasix therapy. She was given Lasix 4067mV & 5m53m and asked to resume Lasix 40mg53m. Weight on admission of 251 lbs came down to 248 lbs by the time  of discharge. She reports having a scale at home but not using it for over a year.  Atypical chest pain: Likely MSK as it was reproducible and initial troponin on admission was negative. Pain improved with Voltaren gel and was prescribed at discharge.  COPD: Remained stable on home medications and oxygen.  Tobacco abuse: She was continued on 14g nicotine patch during her hospital stay and advised to cut down on her smoking.  Constipation: She was given Colace & Sennokot-S as she reported not having a bowel movement for several days prior to admission and was discharged on Sennokot-S.  Hyperlipidemia: Remained stable on home medications.  Code status: On admission, she reported that she would like to have CPR and intubation - inconsistent with prior admissions. She reported she had conversations with those that knew her and thus came to this decision. Her code status should be revisited at clinic visit in light of the likely poor prognosis conferred upon her by the severity of cardiopulmonary disease complicated by her numerous reports of non-adherence to medical therapy and further evidenced by repeat hospitalizations over the past year.      Discharge Vitals:   BP 114/64 mmHg  Pulse 96  Temp(Src) 98.5 F (36.9 C) (Oral)  Resp 18  Ht _0  (1.651 m)  Wt 248 lb (112.492 kg)  BMI 41.27 kg/m2  SpO2 90%  Discharge Labs:  No results found for this or any previous visit (from the past 24 hour(s)).  Signed: Charlott Rakes, MD 04/21/2014, 1:55 PM    Services Ordered on Discharge: None Equipment Ordered on Discharge: None

## 2014-04-23 ENCOUNTER — Ambulatory Visit: Payer: Medicare HMO | Admitting: Internal Medicine

## 2014-04-23 ENCOUNTER — Encounter: Payer: Self-pay | Admitting: Internal Medicine

## 2014-05-01 ENCOUNTER — Encounter: Payer: Self-pay | Admitting: Internal Medicine

## 2014-05-01 ENCOUNTER — Ambulatory Visit: Payer: Medicare HMO | Admitting: Internal Medicine

## 2014-05-07 DIAGNOSIS — J9600 Acute respiratory failure, unspecified whether with hypoxia or hypercapnia: Secondary | ICD-10-CM | POA: Diagnosis not present

## 2014-05-07 DIAGNOSIS — R0902 Hypoxemia: Secondary | ICD-10-CM | POA: Diagnosis not present

## 2014-05-07 DIAGNOSIS — J449 Chronic obstructive pulmonary disease, unspecified: Secondary | ICD-10-CM | POA: Diagnosis not present

## 2014-05-18 DIAGNOSIS — R0602 Shortness of breath: Secondary | ICD-10-CM | POA: Diagnosis not present

## 2014-05-22 ENCOUNTER — Inpatient Hospital Stay (HOSPITAL_COMMUNITY)
Admission: EM | Admit: 2014-05-22 | Discharge: 2014-05-27 | DRG: 291 | Disposition: A | Discharge status: Home Health | Payer: Commercial Managed Care - HMO | Attending: Internal Medicine | Admitting: Internal Medicine

## 2014-05-22 ENCOUNTER — Emergency Department (HOSPITAL_COMMUNITY): Payer: Commercial Managed Care - HMO

## 2014-05-22 ENCOUNTER — Encounter (HOSPITAL_COMMUNITY): Payer: Self-pay | Admitting: *Deleted

## 2014-05-22 DIAGNOSIS — I2510 Atherosclerotic heart disease of native coronary artery without angina pectoris: Secondary | ICD-10-CM | POA: Diagnosis present

## 2014-05-22 DIAGNOSIS — J9622 Acute and chronic respiratory failure with hypercapnia: Secondary | ICD-10-CM | POA: Diagnosis not present

## 2014-05-22 DIAGNOSIS — I2582 Chronic total occlusion of coronary artery: Secondary | ICD-10-CM | POA: Diagnosis present

## 2014-05-22 DIAGNOSIS — R109 Unspecified abdominal pain: Secondary | ICD-10-CM | POA: Diagnosis not present

## 2014-05-22 DIAGNOSIS — I272 Other secondary pulmonary hypertension: Secondary | ICD-10-CM | POA: Diagnosis present

## 2014-05-22 DIAGNOSIS — R410 Disorientation, unspecified: Secondary | ICD-10-CM | POA: Diagnosis not present

## 2014-05-22 DIAGNOSIS — Z91138 Patient's unintentional underdosing of medication regimen for other reason: Secondary | ICD-10-CM | POA: Diagnosis present

## 2014-05-22 DIAGNOSIS — R7309 Other abnormal glucose: Secondary | ICD-10-CM | POA: Diagnosis present

## 2014-05-22 DIAGNOSIS — Z888 Allergy status to other drugs, medicaments and biological substances status: Secondary | ICD-10-CM

## 2014-05-22 DIAGNOSIS — J441 Chronic obstructive pulmonary disease with (acute) exacerbation: Secondary | ICD-10-CM | POA: Diagnosis not present

## 2014-05-22 DIAGNOSIS — T380X5A Adverse effect of glucocorticoids and synthetic analogues, initial encounter: Secondary | ICD-10-CM | POA: Diagnosis not present

## 2014-05-22 DIAGNOSIS — T450X5A Adverse effect of antiallergic and antiemetic drugs, initial encounter: Secondary | ICD-10-CM | POA: Diagnosis not present

## 2014-05-22 DIAGNOSIS — Z7982 Long term (current) use of aspirin: Secondary | ICD-10-CM | POA: Diagnosis not present

## 2014-05-22 DIAGNOSIS — Z7951 Long term (current) use of inhaled steroids: Secondary | ICD-10-CM

## 2014-05-22 DIAGNOSIS — Z720 Tobacco use: Secondary | ICD-10-CM | POA: Diagnosis present

## 2014-05-22 DIAGNOSIS — T501X6A Underdosing of loop [high-ceiling] diuretics, initial encounter: Secondary | ICD-10-CM | POA: Diagnosis present

## 2014-05-22 DIAGNOSIS — T424X5A Adverse effect of benzodiazepines, initial encounter: Secondary | ICD-10-CM | POA: Diagnosis not present

## 2014-05-22 DIAGNOSIS — G4730 Sleep apnea, unspecified: Secondary | ICD-10-CM | POA: Diagnosis present

## 2014-05-22 DIAGNOSIS — E785 Hyperlipidemia, unspecified: Secondary | ICD-10-CM | POA: Diagnosis present

## 2014-05-22 DIAGNOSIS — K5900 Constipation, unspecified: Secondary | ICD-10-CM | POA: Diagnosis not present

## 2014-05-22 DIAGNOSIS — I517 Cardiomegaly: Secondary | ICD-10-CM | POA: Diagnosis not present

## 2014-05-22 DIAGNOSIS — Z8249 Family history of ischemic heart disease and other diseases of the circulatory system: Secondary | ICD-10-CM | POA: Diagnosis not present

## 2014-05-22 DIAGNOSIS — R05 Cough: Secondary | ICD-10-CM | POA: Diagnosis not present

## 2014-05-22 DIAGNOSIS — I5033 Acute on chronic diastolic (congestive) heart failure: Principal | ICD-10-CM | POA: Diagnosis present

## 2014-05-22 DIAGNOSIS — F17210 Nicotine dependence, cigarettes, uncomplicated: Secondary | ICD-10-CM | POA: Diagnosis present

## 2014-05-22 DIAGNOSIS — Z9114 Patient's other noncompliance with medication regimen: Secondary | ICD-10-CM | POA: Diagnosis present

## 2014-05-22 DIAGNOSIS — J9621 Acute and chronic respiratory failure with hypoxia: Secondary | ICD-10-CM | POA: Diagnosis not present

## 2014-05-22 DIAGNOSIS — Z955 Presence of coronary angioplasty implant and graft: Secondary | ICD-10-CM | POA: Diagnosis not present

## 2014-05-22 DIAGNOSIS — E669 Obesity, unspecified: Secondary | ICD-10-CM | POA: Diagnosis present

## 2014-05-22 DIAGNOSIS — Z6841 Body Mass Index (BMI) 40.0 and over, adult: Secondary | ICD-10-CM | POA: Diagnosis not present

## 2014-05-22 DIAGNOSIS — Z8614 Personal history of Methicillin resistant Staphylococcus aureus infection: Secondary | ICD-10-CM | POA: Diagnosis not present

## 2014-05-22 DIAGNOSIS — T486X6A Underdosing of antiasthmatics, initial encounter: Secondary | ICD-10-CM | POA: Diagnosis present

## 2014-05-22 DIAGNOSIS — I509 Heart failure, unspecified: Secondary | ICD-10-CM | POA: Diagnosis not present

## 2014-05-22 DIAGNOSIS — R079 Chest pain, unspecified: Secondary | ICD-10-CM | POA: Diagnosis not present

## 2014-05-22 DIAGNOSIS — J449 Chronic obstructive pulmonary disease, unspecified: Secondary | ICD-10-CM | POA: Diagnosis present

## 2014-05-22 DIAGNOSIS — F99 Mental disorder, not otherwise specified: Secondary | ICD-10-CM | POA: Diagnosis not present

## 2014-05-22 DIAGNOSIS — R0602 Shortness of breath: Secondary | ICD-10-CM | POA: Diagnosis not present

## 2014-05-22 DIAGNOSIS — Z9119 Patient's noncompliance with other medical treatment and regimen: Secondary | ICD-10-CM | POA: Diagnosis present

## 2014-05-22 DIAGNOSIS — Z598 Other problems related to housing and economic circumstances: Secondary | ICD-10-CM | POA: Diagnosis not present

## 2014-05-22 DIAGNOSIS — Z9981 Dependence on supplemental oxygen: Secondary | ICD-10-CM

## 2014-05-22 DIAGNOSIS — J9602 Acute respiratory failure with hypercapnia: Secondary | ICD-10-CM | POA: Diagnosis not present

## 2014-05-22 LAB — COMPREHENSIVE METABOLIC PANEL
ALBUMIN: 3 g/dL — AB (ref 3.5–5.2)
ALT: 8 U/L (ref 0–35)
AST: 10 U/L (ref 0–37)
Alkaline Phosphatase: 103 U/L (ref 39–117)
Anion gap: 6 (ref 5–15)
CO2: 46 mmol/L (ref 19–32)
CREATININE: 0.69 mg/dL (ref 0.50–1.10)
Calcium: 9 mg/dL (ref 8.4–10.5)
Chloride: 87 mmol/L — ABNORMAL LOW (ref 96–112)
GFR calc Af Amer: >90 mL/min (ref >90)
GFR calc non Af Amer: >90 mL/min (ref >90)
Glucose, Bld: 176 mg/dL — ABNORMAL HIGH (ref 70–99)
Potassium: 3.5 mmol/L (ref 3.5–5.1)
Sodium: 139 mmol/L (ref 135–145)
TOTAL PROTEIN: 5.8 g/dL — AB (ref 6.0–8.3)
Total Bilirubin: 1 mg/dL (ref 0.3–1.2)

## 2014-05-22 LAB — CBC
HCT: 41.6 % (ref 36.0–46.0)
HEMOGLOBIN: 12.9 g/dL (ref 12.0–15.0)
MCH: 31.1 pg (ref 26.0–34.0)
MCHC: 31 g/dL (ref 30.0–36.0)
MCV: 100.2 fL — ABNORMAL HIGH (ref 78.0–100.0)
Platelets: 136 10*3/uL — ABNORMAL LOW (ref 150–400)
RBC: 4.15 MIL/uL (ref 3.87–5.11)
RDW: 14.6 % (ref 11.5–15.5)
WBC: 6.7 10*3/uL (ref 4.0–10.5)

## 2014-05-22 LAB — I-STAT TROPONIN, ED: Troponin i, poc: 0.02 ng/mL (ref 0.00–0.08)

## 2014-05-22 LAB — BRAIN NATRIURETIC PEPTIDE: B Natriuretic Peptide: 536 pg/mL — ABNORMAL HIGH (ref 0.0–100.0)

## 2014-05-22 MED ORDER — LEVOFLOXACIN IN D5W 750 MG/150ML IV SOLN
750.0000 mg | INTRAVENOUS | Status: DC
  Administered 2014-05-23 – 2014-05-24 (×3): 750 mg via INTRAVENOUS
  Filled 2014-05-22 (×3): qty 150

## 2014-05-22 MED ORDER — IPRATROPIUM BROMIDE 0.02 % IN SOLN
0.5000 mg | Freq: Once | RESPIRATORY_TRACT | Status: AC
  Administered 2014-05-22: 0.5 mg via RESPIRATORY_TRACT
  Filled 2014-05-22: qty 2.5

## 2014-05-22 MED ORDER — ALBUTEROL (5 MG/ML) CONTINUOUS INHALATION SOLN
10.0000 mg/h | INHALATION_SOLUTION | RESPIRATORY_TRACT | Status: DC
  Administered 2014-05-22: 10 mg/h via RESPIRATORY_TRACT
  Filled 2014-05-22: qty 20

## 2014-05-22 MED ORDER — IPRATROPIUM-ALBUTEROL 0.5-2.5 (3) MG/3ML IN SOLN
3.0000 mL | Freq: Once | RESPIRATORY_TRACT | Status: AC
  Administered 2014-05-22: 3 mL via RESPIRATORY_TRACT
  Filled 2014-05-22: qty 3

## 2014-05-22 MED ORDER — FUROSEMIDE 10 MG/ML IJ SOLN
40.0000 mg | INTRAMUSCULAR | Status: AC
  Administered 2014-05-22: 40 mg via INTRAVENOUS
  Filled 2014-05-22: qty 4

## 2014-05-22 MED ORDER — METHYLPREDNISOLONE SODIUM SUCC 125 MG IJ SOLR
125.0000 mg | Freq: Once | INTRAMUSCULAR | Status: AC
  Administered 2014-05-22: 125 mg via INTRAVENOUS
  Filled 2014-05-22: qty 2

## 2014-05-22 MED ORDER — METHYLPREDNISOLONE SODIUM SUCC 125 MG IJ SOLR
50.0000 mg | Freq: Three times a day (TID) | INTRAMUSCULAR | Status: DC
  Administered 2014-05-23 – 2014-05-24 (×4): 50 mg via INTRAVENOUS
  Filled 2014-05-22: qty 2
  Filled 2014-05-22 (×2): qty 0.8
  Filled 2014-05-22 (×3): qty 2
  Filled 2014-05-22: qty 0.8

## 2014-05-22 MED ORDER — ACETAMINOPHEN 325 MG PO TABS
650.0000 mg | ORAL_TABLET | Freq: Four times a day (QID) | ORAL | Status: DC | PRN
  Administered 2014-05-23: 650 mg via ORAL
  Filled 2014-05-22: qty 2

## 2014-05-22 MED ORDER — PRAVASTATIN SODIUM 20 MG PO TABS
20.0000 mg | ORAL_TABLET | Freq: Every day | ORAL | Status: DC
  Administered 2014-05-23 – 2014-05-26 (×4): 20 mg via ORAL
  Filled 2014-05-22 (×5): qty 1

## 2014-05-22 MED ORDER — SODIUM CHLORIDE 0.9 % IJ SOLN
3.0000 mL | Freq: Two times a day (BID) | INTRAMUSCULAR | Status: DC
  Administered 2014-05-23 – 2014-05-27 (×9): 3 mL via INTRAVENOUS

## 2014-05-22 MED ORDER — SENNOSIDES-DOCUSATE SODIUM 8.6-50 MG PO TABS
2.0000 | ORAL_TABLET | Freq: Every day | ORAL | Status: DC
  Administered 2014-05-23 – 2014-05-26 (×4): 2 via ORAL
  Filled 2014-05-22 (×4): qty 2

## 2014-05-22 MED ORDER — ENOXAPARIN SODIUM 40 MG/0.4ML ~~LOC~~ SOLN: 40.0000 mg | SUBCUTANEOUS | Status: DC

## 2014-05-22 MED ORDER — ACETAMINOPHEN 650 MG RE SUPP: 650.0000 mg | Freq: Four times a day (QID) | RECTAL | Status: DC | PRN

## 2014-05-22 MED ORDER — ASPIRIN 81 MG PO CHEW
81.0000 mg | CHEWABLE_TABLET | Freq: Every day | ORAL | Status: DC
  Administered 2014-05-23 – 2014-05-27 (×5): 81 mg via ORAL
  Filled 2014-05-22 (×5): qty 1

## 2014-05-22 MED ORDER — MOMETASONE FURO-FORMOTEROL FUM 100-5 MCG/ACT IN AERO
2.0000 | INHALATION_SPRAY | Freq: Two times a day (BID) | RESPIRATORY_TRACT | Status: DC
  Administered 2014-05-23 – 2014-05-27 (×8): 2 via RESPIRATORY_TRACT
  Filled 2014-05-22: qty 8.8

## 2014-05-22 MED ORDER — GUAIFENESIN-DM 100-10 MG/5ML PO SYRP: 5.0000 mL | ORAL_SOLUTION | ORAL | Status: DC | PRN

## 2014-05-22 MED ORDER — FUROSEMIDE 10 MG/ML IJ SOLN
20.0000 mg | Freq: Once | INTRAMUSCULAR | Status: AC
Start: 2014-05-22 — End: 2014-05-22
  Administered 2014-05-22: 20 mg via INTRAVENOUS
  Filled 2014-05-22: qty 2

## 2014-05-22 MED ORDER — IPRATROPIUM-ALBUTEROL 0.5-2.5 (3) MG/3ML IN SOLN
3.0000 mL | RESPIRATORY_TRACT | Status: DC
  Administered 2014-05-23 – 2014-05-25 (×13): 3 mL via RESPIRATORY_TRACT
  Filled 2014-05-22 (×12): qty 3

## 2014-05-22 NOTE — ED Notes (Signed)
Will, PA made aware of pt's CO2 levels.

## 2014-05-22 NOTE — ED Notes (Signed)
Respiratory at bedside started continuous nebulizer treatment.

## 2014-05-22 NOTE — H&P (Signed)
Date: 05/23/2014               Patient Name:  [name] MRN: [id number]  DOB: 1955-07-05 Age / Sex: 59 y.o., female   PCP: Karren Cobble, MD         Medical Service: Internal Medicine Teaching Service         Attending Physician: Dr. Madilyn Fireman, MD    First Contact: Dr. Sherrine Maples Pager: [phone]  Second Contact: Dr. Redmond Pulling Pager: [phone]       After Hours (After 5p/  First Contact Pager: [phone]  weekends / holidays): Second Contact Pager: [phone]   Chief Complaint: SOB, hypoxia  History of Present Illness:   59 yo female with CHF, COPD gold stage IV, pulm HTN, CAD, tobacco abuse, multiple admission in the past with A o C CHF exacerbation last 1/7-1/11/16, on home oxygen 4L, here with SOB, found to be hypercarbic and hypoxic. Having SOB for last several days, has been using her nebs (having problem getting albuterol through insurance), hasn't been taking lasix. SOB is associated with cough and increased sputum. Has possible sick contact who has been sick in the hospital.   Her weight is also up from her baseline weight. Has leg swelling. Sat 73% on 4L, 76% on 6L. ABG obtained. Placed on bipap. Started on cont nebs, got 78m IV lasix, solumedrol.  BNP elevated 536, last 171.9. Last echo EF 525-85% diastolic dysfunction, mod increased pulm pressure 64.   Meds: Current Facility-Administered Medications  Medication Dose Route Frequency Provider Last Rate Last Dose  . acetaminophen (TYLENOL) tablet 650 mg  650 mg Oral Q6H PRN SWilber Oliphant MD       Or  . acetaminophen (TYLENOL) suppository 650 mg  650 mg Rectal Q6H PRN SWilber Oliphant MD      . aspirin chewable tablet 81 mg  81 mg Oral Daily Daryn Hicks, MD      . enoxaparin (LOVENOX) injection 40 mg  40 mg Subcutaneous Q24H SWilber Oliphant MD      . guaiFENesin-dextromethorphan (ROBITUSSIN DM) 100-10 MG/5ML syrup 5 mL  5 mL Oral Q4H PRN SWilber Oliphant MD      . ipratropium-albuterol (DUONEB) 0.5-2.5 (3) MG/3ML  nebulizer solution 3 mL  3 mL Nebulization Q4H SWilber Oliphant MD      . levofloxacin (LEVAQUIN) IVPB 750 mg  750 mg Intravenous Q24H SWilber Oliphant MD      . methylPREDNISolone sodium succinate (SOLU-MEDROL) 125 mg/2 mL injection 50 mg  50 mg Intravenous 3 times per day SWilber Oliphant MD      . mometasone-formoterol (DULERA) 100-5 MCG/ACT inhaler 2 puff  2 puff Inhalation BID SWilber Oliphant MD      . pravastatin (PRAVACHOL) tablet 20 mg  20 mg Oral q1800 SWilber Oliphant MD      . senna-docusate (Senokot-S) tablet 2 tablet  2 tablet Oral QHS SWilber Oliphant MD      . sodium chloride 0.9 % injection 3 mL  3 mL Intravenous Q12H SWilber Oliphant MD       Current Outpatient Prescriptions  Medication Sig Dispense Refill  . albuterol (PROVENTIL HFA;VENTOLIN HFA) 108 (90 BASE) MCG/ACT inhaler Inhale 2 puffs into the lungs 3 (three) times daily as needed for wheezing or shortness of breath. 3.7 g 11  . albuterol (PROVENTIL) (2.5 MG/3ML) 0.083% nebulizer solution Take 2.5 mg by nebulization daily as needed for wheezing or shortness of breath.    .Marland Kitchen  Aspirin-Salicylamide-Caffeine (BC HEADACHE POWDER PO) Take 1 Package by mouth as needed (for headache).    . diclofenac sodium (VOLTAREN) 1 % GEL Apply 2 g topically 4 (four) times daily. 1 Tube 0  . Fluticasone-Salmeterol (ADVAIR) 250-50 MCG/DOSE AEPB Inhale 1 puff into the lungs 2 (two) times daily.    . furosemide (LASIX) 20 MG tablet Take 2 tablets (40 mg total) by mouth 2 (two) times daily. 30 tablet   . lovastatin (MEVACOR) 20 MG tablet Take 1 tablet (20 mg total) by mouth at bedtime. 30 tablet 11  . polyethylene glycol (MIRALAX / GLYCOLAX) packet Take 17 g by mouth daily. 14 each 0  . senna-docusate (SENOKOT-S) 8.6-50 MG per tablet Take 2 tablets by mouth at bedtime. 60 tablet 0  . SYMBICORT 160-4.5 MCG/ACT inhaler Inhale 1 puff into the lungs daily.  11  . tiotropium (SPIRIVA HANDIHALER) 18 MCG inhalation capsule Place 1 capsule (18 mcg  total) into inhaler and inhale daily. 30 capsule 12    Allergies: Allergies as of 05/22/2014 - Review Complete 05/22/2014  Allergen Reaction Noted  . Fluconazole Anaphylaxis, Itching, and Swelling    Past Medical History  Diagnosis Date  . Coronary artery disease     S/P PCI of LAD with DES (12/2008). Total occlusion of RCA noted at that time., medically managed. ACS ruled out 03/2009 with Lexiscan myoview . Followed by [facility].  . Pulmonary hypertension     2-D Echo (31/5400) - Systolic pressure was moderately increased. PA peak pressure  69mHg. 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 Followed by Dr. WJoya Gaskins(Velora Heckler  . Diastolic dysfunction     2-D Echo (12/2008) - Normal LV Systolic funciton with EF 60-65%. Grade 1 diastolid dysfunction. No regional wall motion abnormalities. Moderate pulmonary HTN with PA peak pressure 584mg.  . Marland KitchenOPD (chronic obstructive pulmonary disease)     Severe. Gold Stage IV.  PFTs (12/2008) - severe obstructive airway disease. Active tobacco use. Requires 4L O2 at home.  . Pulmonary nodule, right     Small right middle lobe nodule. Stable as of 12/2008.  . Marland Kitchenrediabetes 12/2008    HgbA1c 6.4 (12/2008)  . Hx MRSA infection     Recurrent MRSA thigh abscesses.  . Tobacco abuse     Ongoing.  . Obesity   . Hyperlipidemia   . GERD (gastroesophageal reflux disease)     S/P Nissen fundoplication.  . Shortness of breath   . CHF (congestive heart failure)    Past Surgical History  Procedure Laterality Date  . Total abdominal hysterectomy w/ bilateral salpingoophorectomy    . Nissen fundoplication    . Right heart catheterization N/A 11/09/2012    Procedure: RIGHT HEART CATH;  Surgeon: DaLarey DresserMD;  [facility];  Service: Cardiovascular;  Laterality: N/A;   Family History  Problem Relation Age of Onset  . Heart disease Mother 4738   Deceased from MI at 60yo. Hypertension Mother   . Heart disease Father 5416  Deceased of MI age 60yo. Hypertension Father   . Hypertension Brother   . Lung cancer      Grandmother   History   Social History  . Marital Status: Single    Spouse Name: N/A  . Number of Children: N/A  . Years of Education: N/A   Occupational History  . Not on file.  Social History Main Topics  . Smoking status: Current Every Day Smoker -- 0.50 packs/day for 40 years    Types: Cigarettes  . Smokeless tobacco: Never Used     Comment: 1/2 pack or less. Wants to try the patches.  . Alcohol Use: No  . Drug Use: No  . Sexual Activity: Not on file   Other Topics Concern  . Not on file   Social History Narrative   Formerly worked as a Scientist, water quality, now disabled.   Divorced.   2 grown children.   Lives with her grandson.    Review of Systems: Review of Systems  Constitutional: Negative for fever, chills and diaphoresis.  HENT: Negative for congestion and sore throat.   Eyes: Negative for blurred vision, discharge and redness.  Respiratory: Positive for cough, sputum production and shortness of breath. Negative for wheezing.   Cardiovascular: Positive for leg swelling. Negative for chest pain and palpitations.  Gastrointestinal: Negative for heartburn, nausea, vomiting and abdominal pain.  Genitourinary: Negative for dysuria and hematuria.  Musculoskeletal: Negative.   Skin: Negative for itching and rash.  Neurological: Negative.  Negative for weakness and headaches.  Endo/Heme/Allergies: Negative.   Psychiatric/Behavioral: Negative.      Physical Exam: Blood pressure 132/69, pulse 98, temperature 98.7 F (37.1 C), temperature source Oral, resp. rate 18, height _0  (1.651 m), weight 254 lb (115.214 kg), SpO2 92 %. Physical Exam  Constitutional: She is oriented to person, place, and time. She appears well-developed and well-nourished. She appears distressed.  Ob bipap  HENT:  Head:  Normocephalic and atraumatic.  Right Ear: External ear normal.  Left Ear: External ear normal.  Mouth/Throat: Oropharynx is clear and moist.  Eyes: Conjunctivae and EOM are normal. Pupils are equal, round, and reactive to light. Right eye exhibits no discharge. Left eye exhibits no discharge. No scleral icterus.  Neck: Normal range of motion. Neck supple.  Cardiovascular: Normal rate, regular rhythm and normal heart sounds.  Exam reveals no gallop and no friction rub.   No murmur heard. Respiratory: She is in respiratory distress. She has no wheezes. She has rales. She exhibits no tenderness.  Has rales on RLL. Air movement low. No wheezing.   GI: Soft. Bowel sounds are normal. She exhibits no distension. There is no tenderness. There is no guarding.  Musculoskeletal: Normal range of motion.  Has 2+ pitting edema both legs. Right leg chronically bigger than left. No TTP.  Neurological: She is alert and oriented to person, place, and time. No cranial nerve deficit. Coordination normal.  Psychiatric: She has a normal mood and affect.     Lab results: Basic Metabolic Panel:  Recent Labs  05/22/14 1849  NA 139  K 3.5  CL 87*  CO2 46*  GLUCOSE 176*  BUN <5*  CREATININE 0.69  CALCIUM 9.0   Liver Function Tests:  Recent Labs  05/22/14 1849  AST 10  ALT 8  ALKPHOS 103  BILITOT 1.0  PROT 5.8*  ALBUMIN 3.0*  CBC:  Recent Labs  05/22/14 1849  WBC 6.7  HGB 12.9  HCT 41.6  MCV 100.2*  PLT 136*  Imaging results:  Dg Chest Port 1 View  05/22/2014   CLINICAL DATA:  Intermittent shortness of breath since 05/18/2014.  EXAM: PORTABLE CHEST - 1 VIEW  COMPARISON:  PA and lateral chest 04/10/2014 and 03/18/2014.  FINDINGS: Cardiomegaly and vascular congestion appear unchanged. No consolidative process, pneumothorax or effusion is identified. No focal bony abnormality is noted.  IMPRESSION: No  acute abnormality.  Cardiomegaly and chronic vascular congestion.   Electronically Signed    By: Inge Rise M.D.   On: 05/22/2014 20:02    Other results: EKG: TWI on precordial leads, same as prior.   Assessment & Plan by Problem: Principal Problem:   Acute on chronic respiratory failure with hypercapnia Active Problems:   Obesity   TOBACCO ABUSE   Pulmonary hypertension   COPD, severe   Acute on chronic diastolic CHF (congestive heart failure)  59 yo female with diastolic CHF, tobacco abuse, gold stage IV COPD, here with acute on chronic hypoxic,hypercarbic resp failure.  Acute on chronic hypoxic,hypercarbic resp failure - 2/2 mixed COPD Exacerbation Gold Stage IV or CHF exacerbation (hx of diastolic Heart failure and pulm HTN - follows with Dr. Joya Gaskins) also likely has OHS.  Mentation is intact, appears to be somewhat sleepy but likely since it's late.  Patient admits to medication noncompliance (lasix and nebs)  ABG initially 7.32/>100/91, on bipap for 1 hr repeat showed 7.306/>100/70. Sating 90-mid 90's.  Baseline bicarb around 44, and PCO2 around 60's.  Unlikely pneumonia as no fever, cxr neg for infiltrate. Unlikely PE, wells zero, has chronically larger right leg than left, no TTP, no erythema, no warmth, no recent travel. EKG unchanged, trop neg  Has COPD signs: increased cough and sputum. CHF signs: lower ext edema, increased weight, CXR showing vascular congestion, noncompliance with lasix. BNP 536, previously 171.   - Will treat for both COPD and CHF exacerbation. - Got lasix 65m IV, diuresing well with this. Will do lasix 238mIV once now and then consider 4074mV BID tomorrow if renal function allows.  - strict i/o, may insert foley if required for i/o and comfort measures - treat for COPD exc too with duoneb q4hr + dulera (in place of advair) + solumedrol 54m79mhr + Levaquin - check sputum cx - cont Bipap, will repeat ABG again around 1 am. If doesn't improve, may need intubation. Patient is ok with intubation if needed. - neuro checks q4hr to  monitor mental status while on bipap.  Tobacco abuse - 1/2 pack daily - encouraged tobacco cessation, refused nicotine patch  CAD -S/P PCI of LAD with DES (12/2008). Total occlusion of RCA noted at that time, medically managed. - Not on asa. Will add asa 81mg56mly.  Constipation - chronic  - cont senokot-S  Lovenox for dvt ppx. NPO as on bipap. Full code.  Dispo: Disposition is deferred at this time, awaiting improvement of current medical problems. Anticipated discharge in approximately 2-3 day(s).   The patient does have a current PCP (LawrKarren Cobble and [facility].  The patient does have transportation limitations that hinder transportation to clinic appointments.  Signed: TasriDellia Nims2/19/2016, 12:06 AM

## 2014-05-22 NOTE — ED Provider Notes (Signed)
CSN: [id number]     Arrival date & time 05/22/14  1836 History   First MD Initiated Contact with Patient 05/22/14 1855     Chief Complaint  Patient presents with  . Shortness of Breath   [name] is a 59 y.o. female with a history of COPD, CHF, pulmonary hypertension, coronary artery disease, diastolic dysfunction and smoker who presents to the emergency department complaining of worsening shortness of breath the past 5 days. She also reports she did not take her Lasix today. She is due to be on 40 mg Lasix twice a day. She reports she last took yesterday. Patient reports she stopped smoking 6 days ago. The patient is also complaining of some right upper quadrant/right sided low chest wall pain since today. The patient is on home oxygen at home on 4 L via nasal cannula. She reports no relief with this. She reports chronic constipation. The patient denies fevers, nausea, vomiting, palpitations, rashes,   (Consider location/radiation/quality/duration/timing/severity/associated sxs/prior Treatment) HPI  Past Medical History  Diagnosis Date  . Coronary artery disease     S/P PCI of LAD with DES (12/2008). Total occlusion of RCA noted at that time., medically managed. ACS ruled out 03/2009 with Lexiscan myoview . Followed by [facility].  . Pulmonary hypertension     2-D Echo (40/1027) - Systolic pressure was moderately increased. PA peak pressure  49mHg. 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 Followed by Dr. WJoya Gaskins(Velora Heckler  . Diastolic dysfunction     2-D Echo (12/2008) - Normal LV Systolic funciton with EF 60-65%. Grade 1 diastolid dysfunction. No regional wall motion abnormalities. Moderate pulmonary HTN with PA peak pressure 568mg.  . Marland KitchenOPD (chronic obstructive pulmonary disease)     Severe. Gold Stage IV.  PFTs (12/2008) - severe obstructive airway disease. Active tobacco use. Requires  4L O2 at home.  . Pulmonary nodule, right     Small right middle lobe nodule. Stable as of 12/2008.  . Marland Kitchenrediabetes 12/2008    HgbA1c 6.4 (12/2008)  . Hx MRSA infection     Recurrent MRSA thigh abscesses.  . Tobacco abuse     Ongoing.  . Obesity   . Hyperlipidemia   . GERD (gastroesophageal reflux disease)     S/P Nissen fundoplication.  . Shortness of breath   . CHF (congestive heart failure)    Past Surgical History  Procedure Laterality Date  . Total abdominal hysterectomy w/ bilateral salpingoophorectomy    . Nissen fundoplication    . Right heart catheterization N/A 11/09/2012    Procedure: RIGHT HEART CATH;  Surgeon: DaLarey DresserMD;  [facility];  Service: Cardiovascular;  Laterality: N/A;   Family History  Problem Relation Age of Onset  . Heart disease Mother 4738  Deceased from MI at 60yo. Hypertension Mother   . Heart disease Father 5486  Deceased of MI age 60yo. Hypertension Father   . Hypertension Brother   . Lung cancer      Grandmother   History  Substance Use Topics  . Smoking status: Current Every Day Smoker -- 0.50 packs/day for 40 years    Types: Cigarettes  . Smokeless tobacco: Never Used     Comment: 1/2 pack or less. Wants to try the patches.  . Alcohol Use: No   OB History    No data available  Review of Systems  Constitutional: Negative for fever and chills.  HENT: Negative for congestion, ear pain, sore throat and trouble swallowing.   Eyes: Negative for pain and visual disturbance.  Respiratory: Positive for cough and shortness of breath.   Cardiovascular: Positive for leg swelling. Negative for chest pain and palpitations.  Gastrointestinal: Positive for abdominal pain. Negative for nausea, vomiting and diarrhea.  Genitourinary: Negative for dysuria.  Musculoskeletal: Negative for back pain and neck pain.  Skin: Negative for rash.  Neurological: Negative for headaches.      Allergies  Fluconazole  Home  Medications   Prior to Admission medications   Medication Sig Start Date End Date Taking? Authorizing Provider  albuterol (PROVENTIL HFA;VENTOLIN HFA) 108 (90 BASE) MCG/ACT inhaler Inhale 2 puffs into the lungs 3 (three) times daily as needed for wheezing or shortness of breath. 03/11/14  Yes Jessee Avers, MD  albuterol (PROVENTIL) (2.5 MG/3ML) 0.083% nebulizer solution Take 2.5 mg by nebulization daily as needed for wheezing or shortness of breath.   Yes Historical Provider, MD  Aspirin-Salicylamide-Caffeine (BC HEADACHE POWDER PO) Take 1 Package by mouth as needed (for headache).   Yes Historical Provider, MD  diclofenac sodium (VOLTAREN) 1 % GEL Apply 2 g topically 4 (four) times daily. 04/12/14  Yes Ejiroghene E Emokpae, MD  Fluticasone-Salmeterol (ADVAIR) 250-50 MCG/DOSE AEPB Inhale 1 puff into the lungs 2 (two) times daily.   Yes Historical Provider, MD  furosemide (LASIX) 20 MG tablet Take 2 tablets (40 mg total) by mouth 2 (two) times daily. 04/12/14  Yes Ejiroghene E Emokpae, MD  lovastatin (MEVACOR) 20 MG tablet Take 1 tablet (20 mg total) by mouth at bedtime. 06/12/13  Yes Jessee Avers, MD  polyethylene glycol [facility] / GLYCOLAX) packet Take 17 g by mouth daily. 04/12/14  Yes Ejiroghene E Emokpae, MD  senna-docusate (SENOKOT-S) 8.6-50 MG per tablet Take 2 tablets by mouth at bedtime. 04/12/14  Yes Ejiroghene Arlyce Dice, MD  SYMBICORT 160-4.5 MCG/ACT inhaler Inhale 1 puff into the lungs daily. 03/04/14  Yes Historical Provider, MD  tiotropium (SPIRIVA HANDIHALER) 18 MCG inhalation capsule Place 1 capsule (18 mcg total) into inhaler and inhale daily. 06/12/13  Yes Jessee Avers, MD   BP 117/49 mmHg  Pulse 92  Temp(Src) 98.7 F (37.1 C) (Oral)  Resp 19  Ht _0  (1.651 m)  Wt 254 lb (115.214 kg)  BMI 42.27 kg/m2  SpO2 94% Physical Exam  Constitutional: She is oriented to person, place, and time. She appears well-developed and well-nourished. No distress.  HENT:  Head: Normocephalic  and atraumatic.  Mouth/Throat: Oropharynx is clear and moist. No oropharyngeal exudate.  Eyes: Conjunctivae are normal. Pupils are equal, round, and reactive to light. Right eye exhibits no discharge. Left eye exhibits no discharge.  Neck: Neck supple. No JVD present.  Cardiovascular: Normal rate, regular rhythm, normal heart sounds and intact distal pulses.  Exam reveals no gallop and no friction rub.   No murmur heard. Pulmonary/Chest:  Diminished lung sounds bilaterally with little air movement and rales. With increased work of breathing.   Abdominal: Soft. Bowel sounds are normal. She exhibits no distension and no mass. There is tenderness. There is no rebound and no guarding.  Musculoskeletal: She exhibits edema.  Mild bilateral lower extremity edema.   Lymphadenopathy:    She has no cervical adenopathy.  Neurological: She is alert and oriented to person, place, and time. Coordination normal.  Skin: Skin is warm and dry. No rash noted. No erythema. No pallor.  Psychiatric: She has a normal mood and affect. Her behavior is normal.  Nursing note and vitals reviewed.   ED Course  Procedures (including critical care time) Labs Review Labs Reviewed  CBC - Abnormal; Notable for the following:    MCV 100.2 (*)    Platelets 136 (*)    All other components within normal limits  BRAIN NATRIURETIC PEPTIDE - Abnormal; Notable for the following:    B Natriuretic Peptide 536.0 (*)    All other components within normal limits  COMPREHENSIVE METABOLIC PANEL - Abnormal; Notable for the following:    Chloride 87 (*)    CO2 46 (*)    Glucose, Bld 176 (*)    BUN <5 (*)    Total Protein 5.8 (*)    Albumin 3.0 (*)    All other components within normal limits  I-STAT ARTERIAL BLOOD GAS, ED - Abnormal; Notable for the following:    pCO2 arterial 99.5 (*)    pO2, Arterial 79.0 (*)    Bicarbonate 56.4 (*)    Acid-Base Excess 24.0 (*)    All other components within normal limits  CULTURE,  EXPECTORATED SPUTUM-ASSESSMENT  URINE CULTURE  BLOOD GAS, ARTERIAL  BASIC METABOLIC PANEL  CBC  MAGNESIUM  HIV ANTIBODY (ROUTINE TESTING)  URINALYSIS, ROUTINE W REFLEX MICROSCOPIC  BLOOD GAS, ARTERIAL  I-STAT TROPOININ, ED    Imaging Review Dg Chest Port 1 View  05/22/2014   CLINICAL DATA:  Intermittent shortness of breath since 05/18/2014.  EXAM: PORTABLE CHEST - 1 VIEW  COMPARISON:  PA and lateral chest 04/10/2014 and 03/18/2014.  FINDINGS: Cardiomegaly and vascular congestion appear unchanged. No consolidative process, pneumothorax or effusion is identified. No focal bony abnormality is noted.  IMPRESSION: No acute abnormality.  Cardiomegaly and chronic vascular congestion.   Electronically Signed   By: Inge Rise M.D.   On: 05/22/2014 20:02     EKG Interpretation   Date/Time:  Thursday May 22 2014 18:48:21 EST Ventricular Rate:  82 PR Interval:  140 QRS Duration: 84 QT Interval:  386 QTC Calculation: 450 R Axis:   94 Text Interpretation:  Normal sinus rhythm with sinus arrhythmia Rightward  axis RSR' or QR pattern in V1 suggests right ventricular conduction delay  ST \T\ T wave abnormality, consider anterior ischemia Abnormal ECG since  last tracing no significant change Confirmed by WENTZ  MD, ELLIOTT [phone])  on 05/22/2014 7:01:11 PM      Filed Vitals:   05/23/14 0030 05/23/14 0033 05/23/14 0045 05/23/14 0100  BP: 122/76  112/69 117/49  Pulse: 91  95 92  Temp:      TempSrc:      Resp:      Height:      Weight:      SpO2: 89% 91% 93% 94%     MDM   Final diagnoses:  Acute on chronic respiratory failure with hypercapnia   This is a 59 y.o. female with a history of COPD, CHF, pulmonary hypertension, coronary artery disease, diastolic dysfunction and smoker who presents to the emergency department complaining of worsening shortness of breath the past 5 days. She also reports she did not take her Lasix today. She is on 4 L of oxygen at home via nasal  cannula. According to triage nurse the patient had oxygen saturations in the 70s on 4 L. Patient placed on nonrebreather and improved. Patient was given 40 mg IV Lasix, 125 of Solu-Medrol, and continuous neb initially. CXR shows cardiomegaly and chronic vascular congestion. CMP  shows CO2 of 46. BNP is 536.  Still having shortness of breath and diminished lung sounds. ABG indicated a PCO2 of greater than 100 and a PH of 7.32. Patient placed on bipap and consulted for admission. Patient accepted for admission by internal medicine with attending Dr. Ellwood Dense.  Patient is okay with intubation if needed.   This patient was discussed with and evaluated by Dr. Eulis Foster who agrees with assessment and plan.   CRITICAL CARE Performed by: Hanley Hays   Total critical care time: 35 minutes   Critical care time was exclusive of separately billable procedures and treating other patients.  Critical care was necessary to treat or prevent imminent or life-threatening deterioration.  Critical care was time spent personally by me on the following activities: development of treatment plan with patient and/or surrogate as well as nursing, discussions with consultants, evaluation of patient's response to treatment, examination of patient, obtaining history from patient or surrogate, ordering and performing treatments and interventions, ordering and review of laboratory studies, ordering and review of radiographic studies, pulse oximetry and re-evaluation of patient's condition.   Hanley Hays, PA-C 05/23/14 0132  Richarda Blade, MD 05/23/14 [phone]

## 2014-05-22 NOTE — ED Notes (Signed)
Per admitting mds, if pt continues to tire out page.  Pt ok with intubation if needed.  Recheck ABG in an hr.

## 2014-05-22 NOTE — ED Notes (Signed)
Admitting at bedside.

## 2014-05-22 NOTE — ED Provider Notes (Signed)
  Face-to-face evaluation   History: She complains of trouble breathing for several days, and difficulty taking her nebulizer because it is "out of date".  She takes oxygen at home.  She stopped smoking 6 days ago.  Physical exam: Alert, calm, cooperative.  She is not lethargic.  Lungs with decreased abnormal bilaterally and scattered rails.  No wheezes.  Medical screening examination/treatment/procedure(s) were conducted as a shared visit with non-physician practitioner(s) and myself.  I personally evaluated the patient during the encounter   EKG Interpretation  Date/Time:  Thursday May 22 2014 18:48:21 EST Ventricular Rate:  82 PR Interval:  140 QRS Duration: 84 QT Interval:  386 QTC Calculation: 450 R Axis:   94 Text Interpretation:  Normal sinus rhythm with sinus arrhythmia Rightward axis RSR' or QR pattern in V1 suggests right ventricular conduction delay ST \T\ T wave abnormality, consider anterior ischemia Abnormal ECG since last tracing no significant change Confirmed by Eulis Foster  MD, Karmah Potocki [phone]) on 05/22/2014 7:01:11 PM       Richarda Blade, MD 05/23/14 [phone]

## 2014-05-22 NOTE — ED Notes (Addendum)
Pt sts she did not take her Lasix today due to not feeling well.  Stated she did not want to be up all day.

## 2014-05-22 NOTE — ED Notes (Signed)
Pt with intermittent sob since Sunday.  Today pt had increased sob.  Sats of 73% on 4 L in triage.  Increased to 76% on 6 L.  Pt c/o pain to R chest under R breast.

## 2014-05-22 NOTE — ED Notes (Signed)
Pt placed back on NRB.  On 4L [place], pt desated once again to 88%.  Will, PA made aware.

## 2014-05-22 NOTE — ED Notes (Addendum)
After completion of breathing treatment, pt still with diminished breath sounds with very little air movement.  Pt desated to 86% on 4L.  Pt placed on NRB once again.  Will, PA made aware.

## 2014-05-23 DIAGNOSIS — J9622 Acute and chronic respiratory failure with hypercapnia: Secondary | ICD-10-CM

## 2014-05-23 DIAGNOSIS — J441 Chronic obstructive pulmonary disease with (acute) exacerbation: Secondary | ICD-10-CM

## 2014-05-23 DIAGNOSIS — F17210 Nicotine dependence, cigarettes, uncomplicated: Secondary | ICD-10-CM

## 2014-05-23 DIAGNOSIS — I5033 Acute on chronic diastolic (congestive) heart failure: Principal | ICD-10-CM

## 2014-05-23 DIAGNOSIS — Z7982 Long term (current) use of aspirin: Secondary | ICD-10-CM

## 2014-05-23 DIAGNOSIS — I2510 Atherosclerotic heart disease of native coronary artery without angina pectoris: Secondary | ICD-10-CM

## 2014-05-23 DIAGNOSIS — K5900 Constipation, unspecified: Secondary | ICD-10-CM

## 2014-05-23 LAB — BLOOD GAS, ARTERIAL
Acid-Base Excess: 22.9 mmol/L — ABNORMAL HIGH (ref 0.0–2.0)
Bicarbonate: 49.3 mEq/L — ABNORMAL HIGH (ref 20.0–24.0)
Drawn by: 406621
FIO2: 0.6 %
O2 Saturation: 97.6 %
PATIENT TEMPERATURE: 98.6
PCO2 ART: 79.2 mmHg — AB (ref 35.0–45.0)
TCO2: 51.7 mmol/L (ref 0–100)
pH, Arterial: 7.411 (ref 7.350–7.450)
pO2, Arterial: 96.2 mmHg (ref 80.0–100.0)

## 2014-05-23 LAB — I-STAT ARTERIAL BLOOD GAS, ED
ACID-BASE EXCESS: 24 mmol/L — AB (ref 0.0–2.0)
Acid-Base Excess: 23 mmol/L — ABNORMAL HIGH (ref 0.0–2.0)
BICARBONATE: 56.4 meq/L — AB (ref 20.0–24.0)
BICARBONATE: 54.1 meq/L — AB (ref 20.0–24.0)
O2 SAT: 93 %
O2 Saturation: 92 %
PH ART: 7.369 (ref 7.350–7.450)
PO2 ART: 72 mmHg — AB (ref 80.0–100.0)
TCO2: >50 mmol/L (ref 0–100)
pCO2 arterial: 99.5 mmHg (ref 35.0–45.0)
pCO2 arterial: 93.9 mmHg (ref 35.0–45.0)
pH, Arterial: 7.362 (ref 7.350–7.450)
pO2, Arterial: 79 mmHg — ABNORMAL LOW (ref 80.0–100.0)

## 2014-05-23 LAB — URINALYSIS, ROUTINE W REFLEX MICROSCOPIC
Bilirubin Urine: NEGATIVE
Glucose, UA: NEGATIVE mg/dL
HGB URINE DIPSTICK: NEGATIVE
Ketones, ur: NEGATIVE mg/dL
Leukocytes, UA: NEGATIVE
Nitrite: NEGATIVE
PH: 5 (ref 5.0–8.0)
Protein, ur: NEGATIVE mg/dL
SPECIFIC GRAVITY, URINE: 1.008 (ref 1.005–1.030)
Urobilinogen, UA: 1 mg/dL (ref 0.0–1.0)

## 2014-05-23 LAB — BASIC METABOLIC PANEL
CALCIUM: 9 mg/dL (ref 8.4–10.5)
Chloride: 84 mmol/L — ABNORMAL LOW (ref 96–112)
Creatinine, Ser: 0.8 mg/dL (ref 0.50–1.10)
GFR calc non Af Amer: 79 mL/min — ABNORMAL LOW (ref >90)
Glucose, Bld: 257 mg/dL — ABNORMAL HIGH (ref 70–99)
Potassium: 3.1 mmol/L — ABNORMAL LOW (ref 3.5–5.1)
Sodium: 143 mmol/L (ref 135–145)

## 2014-05-23 LAB — MAGNESIUM: Magnesium: 1.4 mg/dL — ABNORMAL LOW (ref 1.5–2.5)

## 2014-05-23 LAB — GLUCOSE, CAPILLARY: Glucose-Capillary: 190 mg/dL — ABNORMAL HIGH (ref 70–99)

## 2014-05-23 LAB — CBC
HCT: 42.3 % (ref 36.0–46.0)
Hemoglobin: 13 g/dL (ref 12.0–15.0)
MCH: 30.6 pg (ref 26.0–34.0)
MCHC: 30.7 g/dL (ref 30.0–36.0)
MCV: 99.5 fL (ref 78.0–100.0)
PLATELETS: 133 10*3/uL — AB (ref 150–400)
RBC: 4.25 MIL/uL (ref 3.87–5.11)
RDW: 14.6 % (ref 11.5–15.5)
WBC: 8.6 10*3/uL (ref 4.0–10.5)

## 2014-05-23 LAB — MRSA PCR SCREENING: MRSA by PCR: NEGATIVE

## 2014-05-23 MED ORDER — POTASSIUM CHLORIDE CRYS ER 20 MEQ PO TBCR
40.0000 meq | EXTENDED_RELEASE_TABLET | Freq: Once | ORAL | Status: AC
  Administered 2014-05-23: 40 meq via ORAL
  Filled 2014-05-23: qty 2

## 2014-05-23 MED ORDER — ENOXAPARIN SODIUM 60 MG/0.6ML ~~LOC~~ SOLN
0.5000 mg/kg | SUBCUTANEOUS | Status: DC
  Administered 2014-05-23 – 2014-05-27 (×5): 60 mg via SUBCUTANEOUS
  Filled 2014-05-23 (×5): qty 0.6

## 2014-05-23 MED ORDER — IBUPROFEN 200 MG PO TABS
400.0000 mg | ORAL_TABLET | ORAL | Status: DC | PRN
  Administered 2014-05-23 – 2014-05-25 (×2): 400 mg via ORAL
  Filled 2014-05-23 (×2): qty 2

## 2014-05-23 MED ORDER — MAGNESIUM SULFATE 2 GM/50ML IV SOLN
2.0000 g | Freq: Once | INTRAVENOUS | Status: AC
  Administered 2014-05-23: 2 g via INTRAVENOUS
  Filled 2014-05-23: qty 50

## 2014-05-23 MED ORDER — IPRATROPIUM-ALBUTEROL 0.5-2.5 (3) MG/3ML IN SOLN: 3.0000 mL | RESPIRATORY_TRACT | Status: DC | PRN

## 2014-05-23 MED ORDER — CETYLPYRIDINIUM CHLORIDE 0.05 % MT LIQD
7.0000 mL | Freq: Two times a day (BID) | OROMUCOSAL | Status: DC
  Administered 2014-05-23 – 2014-05-27 (×5): 7 mL via OROMUCOSAL

## 2014-05-23 MED ORDER — FUROSEMIDE 10 MG/ML IJ SOLN
20.0000 mg | Freq: Once | INTRAMUSCULAR | Status: AC
  Administered 2014-05-23: 20 mg via INTRAVENOUS
  Filled 2014-05-23: qty 2

## 2014-05-23 NOTE — Progress Notes (Signed)
Went to check on patient. Talked to RT earlier, patient was on [place] , sat has been at goal 88-90's. Then when she was falling asleep, the sat dropped to high 70's. Was put back on partial non-re-breather. Currently sat high 90's on this setting. Patient is feeling fine. SOB has improved a lot, no chest pain. Has mild headache for which she is getting tylenol/ibuprofen.   Exam:  Heent: [place]/at Lungs: still has low airmovement, has RLL crackles, no wheezing on exam Heart: rrr, no mr/g Abd: soft, nt, nd.  A/p:  Continue same treatment plan for copd and chf exacerbation. Continue partial non-re-breather for now, goal oxygen sat is 88% or higher. Primary team will follow up tomorrow, otherwise we will keep monitoring her sats overnight.

## 2014-05-23 NOTE — Progress Notes (Signed)
Spoke with the patient about removing her foley. Pt stated "I don't want to have to get up and down to go to the bathroom." Educated the patient on the risks for infection with a urinary catheter and she said she understood and wanted to keep the foley in. Will attempt later to remove the foley and educate the pt.

## 2014-05-23 NOTE — ED Notes (Signed)
Called Dr. Gilford Rile with blood gas results.

## 2014-05-23 NOTE — ED Notes (Signed)
Patient is resting comfortably. 

## 2014-05-23 NOTE — Progress Notes (Signed)
Subjective: [name] feels much better this morning. She admits that she has not been taking her lasix at home, and only using some of her inhalers.  Objective: Vital signs in last 24 hours: Filed Vitals:   05/23/14 1101 05/23/14 1123 05/23/14 1200 05/23/14 1209  BP: 121/59  151/91   Pulse: 91  84   Temp: 98.4 F (36.9 C)     TempSrc: Oral     Resp: 22  14   Height:      Weight:      SpO2: 86% 81% 95% 95%   Weight change:   Intake/Output Summary (Last 24 hours) at 05/23/14 1213 Last data filed at 05/23/14 0900  Gross per 24 hour  Intake      0 ml  Output   2415 ml  Net  -2415 ml   Physical Exam: Appearance: in NAD, talkative, partial non-rebreather in place HEENT: [place]/AT, PERRL, EOMi, no lymphadenopathy Heart: RRR, normal S1S2, no MRG Lungs: on partial non-rebreather, no longer in respiratory distress, no wheezes, crackles at bases b/l Abdomen: BS+, soft, nontender, no organomegaly Extremities: normal range of motion, 1+ pitting edema R>L, right leg bigger than left (this is chronic for her), no tenderness Neurologic: A&Ox3, grossly intact Skin: no rashes or lesions  Lab Results: Basic Metabolic Panel:  Recent Labs Lab 05/22/14 1849 05/23/14 0120  NA 139 143  K 3.5 3.1*  CL 87* 84*  CO2 46* >50*  GLUCOSE 176* 257*  BUN <5* <5*  CREATININE 0.69 0.80  CALCIUM 9.0 9.0  MG  --  1.4*   Liver Function Tests:  Recent Labs Lab 05/22/14 1849  AST 10  ALT 8  ALKPHOS 103  BILITOT 1.0  PROT 5.8*  ALBUMIN 3.0*   CBC:  Recent Labs Lab 05/22/14 1849 05/23/14 0120  WBC 6.7 8.6  HGB 12.9 13.0  HCT 41.6 42.3  MCV 100.2* 99.5  PLT 136* 133*   CBG:  Recent Labs Lab 05/23/14 0729  GLUCAP 190*   Urine Drug Screen: Drugs of Abuse     Component Value Date/Time   LABOPIA NONE DETECTED 07/01/2013 1218   LABOPIA NEGATIVE 02/04/2009 2315   COCAINSCRNUR NONE DETECTED 07/01/2013 1218   COCAINSCRNUR NEGATIVE 02/04/2009 2315   LABBENZ NONE DETECTED  07/01/2013 1218   LABBENZ NEGATIVE 02/04/2009 2315   AMPHETMU NONE DETECTED 07/01/2013 1218   AMPHETMU NEGATIVE 02/04/2009 2315   THCU NONE DETECTED 07/01/2013 1218   LABBARB NONE DETECTED 07/01/2013 1218    Urinalysis:  Recent Labs Lab 05/23/14 0048  COLORURINE YELLOW  LABSPEC 1.008  PHURINE 5.0  GLUCOSEU NEGATIVE  HGBUR NEGATIVE  BILIRUBINUR NEGATIVE  KETONESUR NEGATIVE  PROTEINUR NEGATIVE  UROBILINOGEN 1.0  NITRITE NEGATIVE  LEUKOCYTESUR NEGATIVE    Micro Results: Recent Results (from the past 240 hour(s))  MRSA PCR Screening     Status: None   Collection Time: 05/23/14  7:10 AM  Result Value Ref Range Status   MRSA by PCR NEGATIVE NEGATIVE Final    Comment:        The GeneXpert MRSA Assay (FDA approved for NASAL specimens only), is one component of a comprehensive MRSA colonization surveillance program. It is not intended to diagnose MRSA infection nor to guide or monitor treatment for MRSA infections.    Studies/Results: Dg Chest Port 1 View  05/22/2014   CLINICAL DATA:  Intermittent shortness of breath since 05/18/2014.  EXAM: PORTABLE CHEST - 1 VIEW  COMPARISON:  PA and lateral chest 04/10/2014 and 03/18/2014.  FINDINGS: Cardiomegaly and vascular congestion appear unchanged. No consolidative process, pneumothorax or effusion is identified. No focal bony abnormality is noted.  IMPRESSION: No acute abnormality.  Cardiomegaly and chronic vascular congestion.   Electronically Signed   By: [name] M.D.   On: 05/22/2014 20:02   Medications: I have reviewed the patient's current medications. Scheduled Meds: . antiseptic oral rinse  7 mL Mouth Rinse BID  . aspirin  81 mg Oral Daily  . enoxaparin (LOVENOX) injection  0.5 mg/kg Subcutaneous Q24H  . ipratropium-albuterol  3 mL Nebulization Q4H  . levofloxacin (LEVAQUIN) IV  750 mg Intravenous Q24H  . methylPREDNISolone (SOLU-MEDROL) injection  50 mg Intravenous 3 times per day  . mometasone-formoterol  2  puff Inhalation BID  . pravastatin  20 mg Oral q1800  . senna-docusate  2 tablet Oral QHS  . sodium chloride  3 mL Intravenous Q12H   Continuous Infusions:  PRN Meds:.acetaminophen **OR** acetaminophen, guaiFENesin-dextromethorphan, ipratropium-albuterol Assessment/Plan: Principal Problem:   Acute on chronic respiratory failure with hypercapnia Active Problems:   Obesity   TOBACCO ABUSE   Pulmonary hypertension   COPD, severe   Acute on chronic diastolic CHF (congestive heart failure)  59 yo female with history of diastolic CHF, tobacco abuse and gold stage IV COPD admitted overnight with acute on chronic hypoxic, hypercarbic respiratory failure. Responded well to lasix and bipap.  Acute on chronic hypoxic,hypercarbic resp failure: Likely due to mixed COPD Exacerbation Gold Stage IV and CHF exacerbation (hx of diastolic heart failure and pulmonary HTN - follows with Dr. Joya [name] Also likely has OHS. Patient has been experiencing an unproductive cough and increase weight with CXR showing vascular congestion in the setting of noncompliance with lasix. BNP 536, previously 171. She has had clear mentation throughout and admitted to medication noncompliance, particularly with her lasix over the past few days. ABG initially 7.32/>100/91, on bipap for 1 hour repeat showed 7.306/>100/70. Of note, her baseline bicarb around 44, and PCO2 around 60's. Sating 90-mid 90's. CXR negative for infiltrate. Unlikely PE (Wells' Score is zero, has chronically larger right leg than left leg, no TTP, no erythema, no warmth). Negative troponin with unchanged EKG. Treating for both COPD and CHF exacerbation - Repeat ABG this afternoon ~4:00PM; goal for CO2 ~70s (near baseline 60s) - Got lasix 55m IV and lasix 215mIV; giving another 2074mV this afternoon ~2:00PM (Cr. 0.80 today) - Strict i/o, has foley, down 2325m53mter first 2 lasix doses - Treat for COPD exacerbation with duonebs q4hr + dulera (in place of advair)  + solumedrol 50mg27mr + Levaquin 750 mg daily - Sputum cx pending - Continue Bipap and/or partial non-rebreather as needed - Neuro checks q4hr to monitor mental status while on bipap  Tobacco abuse: 1/2 pack daily. Quit 5 days prior to admission due to breathing status. - Encouraged continued tobacco cessation - Refused nicotine patch  CAD: S/P PCI of LAD with DES (12/2008). Total occlusion of RCA noted at that time, medically managed. - Continue with added asa 81mg 8my in context of stent  Constipation: chronic  - Continue senokot-S  DVT Ppx: Lovenox  Diet: Patient allowed to eat (can take a break from bipap if necessary) - Regular diet  Full code including intubation.  Dispo: Disposition is deferred at this time, awaiting improvement of current medical problems.  Anticipated discharge in approximately 1-2 day(s).   The patient does have a current PCP (Morgan Cobbleand [facility]  [facility].  The patient does not have transportation limitations that hinder transportation to clinic appointments.  .Services Needed at time of discharge: Y = Yes, Blank = No PT:   OT:   RN:   Equipment:   Other:     LOS: 1 day   Drucilla Schmidt, MD 05/23/2014, 12:13 PM

## 2014-05-23 NOTE — Progress Notes (Signed)
Nutrition Brief Note  Patient identified on the Malnutrition Screening Tool (MST) Report  Wt Readings from Last 15 Encounters:  05/22/14 254 lb (115.214 kg)  04/12/14 248 lb (112.492 kg)  04/10/14 256 lb 14.4 oz (116.529 kg)  03/20/14 254 lb 3.1 oz (115.3 kg)  02/24/14 251 lb (113.853 kg)  02/10/14 256 lb 1.6 oz (116.166 kg)  01/22/14 261 lb 12.8 oz (118.752 kg)  12/22/13 254 lb (115.214 kg)  12/02/13 254 lb 3.2 oz (115.304 kg)  11/27/13 247 lb 8 oz (112.265 kg)  08/21/13 236 lb 8.9 oz (107.3 kg)  08/07/13 246 lb 11.2 oz (111.902 kg)  07/03/13 235 lb 7.2 oz (106.8 kg)  06/12/13 243 lb 9.6 oz (110.496 kg)  04/24/13 242 lb 8 oz (109.997 kg)   59 yo female with CHF, COPD gold stage IV, pulm HTN, CAD, tobacco abuse, multiple admission in the past with A o C CHF exacerbation last 1/7-1/11/16, on home oxygen 4L, here with SOB, found to be hypercarbic and hypoxic. Having SOB for last several days, has been using her nebs (having problem getting albuterol through insurance), hasn't been taking lasix. SOB is associated with cough and increased sputum. Has possible sick contact who has been sick in the hospital.    Spoke with RN who reports pt has a good appetite currently and PTA. Wt hx reveals no wt loss. RN reports that pt has been noncompliant with diuretics. Pt ate all of her breakfast this AM.   Body mass index is 42.27 kg/(m^2). Patient meets criteria for extreme obesity, class III based on current BMI.   Current diet order is regular, patient is consuming approximately 100% of meals at this time. Labs and medications reviewed.   No nutrition interventions warranted at this time. If nutrition issues arise, please consult RD.   Salil Raineri A. Jimmye Norman, RD, LDN, CDE Pager: [phone] After hours Pager: [phone]

## 2014-05-23 NOTE — Progress Notes (Signed)
CRITICAL VALUE ALERT  Critical value received:  CO2 79.2   Date of notification: 05/23/14  Time of notification:  1700  Critical value read back:Yes.    Nurse who received alert:  Newman Nickels RN  MD notified (1st page):  Dr. Sherrine Maples  Time of first page:  1704  Responding MD:  Dr. Sherrine Maples  Time MD responded: [phone]

## 2014-05-23 NOTE — Care Management Note (Addendum)
Page 1 of 1   05/27/2014     3:59:38 PM CARE MANAGEMENT NOTE 05/27/2014  Patient:  FOREST, PRUDEN   Account Number:  [id number]  Date Initiated:  05/23/2014  Documentation initiated by:  Elissa Hefty  Subjective/Objective Assessment:   adm w copd, chf     Action/Plan:   lives at home   Anticipated DC Date:  05/27/2014   Anticipated DC Plan:  [place]  CM consult      Choice offered to / List presented to:          [facility] arranged  [place] - 11 Patient Refused      Status of service:   Medicare Important Message given?  YES (If response is "NO", the following Medicare IM given date fields will be blank) Date Medicare IM given:  05/26/2014 Medicare IM given by:  Elissa Hefty Date Additional Medicare IM given:   Additional Medicare IM given by:    Discharge Disposition:  HOME/SELF CARE  Per UR Regulation:  Reviewed for med. necessity/level of care/duration of stay  If discussed at [place] of Stay Meetings, dates discussed:   05/27/2014    Comments:  2/23 1548 debbie Ulysse Siemen rn,bsn spoke w pt. she uses [facility] for home o2. she does have her portable o2 tank for dc home w her. she does not want hhrn or hhpt. she politely refuses hhc.

## 2014-05-23 NOTE — Progress Notes (Signed)
Went to check on patient. She is breathing comfortably. On non-re-breather currently. satting 95 on it. Agrees to go back on bipap after she gets something to eat. Asked RN to give her some food and then put her back on bipap.

## 2014-05-23 NOTE — Progress Notes (Signed)
Switched pt from partial rebreather to Venturi Mask 8L/40%. RT will continue to wean O2 as tolerated

## 2014-05-23 NOTE — Progress Notes (Signed)
Inpatient Diabetes Program Recommendations  AACE/ADA: New Consensus Statement on Inpatient Glycemic Control (2013)  Target Ranges:  Prepandial:   less than 140 mg/dL      Peak postprandial:   less than 180 mg/dL (1-2 hours)      Critically ill patients:  140 - 180 mg/dL   Reason for Assessment: Hyperglycemia  Diabetes history: Pre-diabetes 2010 with A1C of 6.4 Outpatient Diabetes medications: N/A Current orders for Inpatient glycemic control: None except for fasting CBG daily  Results for FLEURETTE, WOOLBRIGHT (MRN [id number]) as of 05/23/2014 11:47  Ref. Range 08/19/2013 17:51 02/10/2014 10:44  Hemoglobin A1C Latest Range: <5.7 % 5.9 (H) 5.6    NotResults for ANNARAE, MACNAIR (MRN [id number]) as of 05/23/2014 11:47  Ref. Range 05/23/2014 07:29  Glucose-Capillary Latest Range: 70-99 mg/dL 190 (H)   On SoluMedrol.  Glucose elevations likely related to SoluMedrol.    Recommendations: Increase frequency of CBGs to ac and HS.  Consider ordering correction Novolog correction if deemed appropriate.  Order another A1C if deemed appropriate Consider CHO Modified restriction to diet order if appropriate    Thank you.  Tekila Caillouet S. Marcelline Mates, RN, CNS, CDE Inpatient Diabetes Program, team pager [phone]

## 2014-05-24 LAB — TROPONIN I
TROPONIN I: 0.03 ng/mL (ref <0.031)
Troponin I: 0.03 ng/mL (ref <0.031)
Troponin I: 0.03 ng/mL (ref <0.031)

## 2014-05-24 LAB — CBC
HCT: 40 % (ref 36.0–46.0)
HEMOGLOBIN: 12.3 g/dL (ref 12.0–15.0)
MCH: 30.5 pg (ref 26.0–34.0)
MCHC: 30.8 g/dL (ref 30.0–36.0)
MCV: 99.3 fL (ref 78.0–100.0)
Platelets: 158 10*3/uL (ref 150–400)
RBC: 4.03 MIL/uL (ref 3.87–5.11)
RDW: 14.6 % (ref 11.5–15.5)
WBC: 11.5 10*3/uL — AB (ref 4.0–10.5)

## 2014-05-24 LAB — POCT I-STAT 3, ART BLOOD GAS (G3+)
Acid-Base Excess: 23 mmol/L — ABNORMAL HIGH (ref 0.0–2.0)
BICARBONATE: 52.4 meq/L — AB (ref 20.0–24.0)
O2 SAT: 99 %
PCO2 ART: 77.9 mmHg — AB (ref 35.0–45.0)
TCO2: >50 mmol/L (ref 0–100)
pH, Arterial: 7.436 (ref 7.350–7.450)
pO2, Arterial: 160 mmHg — ABNORMAL HIGH (ref 80.0–100.0)

## 2014-05-24 LAB — URINE CULTURE
CULTURE: NO GROWTH
Colony Count: NO GROWTH
Special Requests: NORMAL

## 2014-05-24 LAB — GLUCOSE, CAPILLARY
GLUCOSE-CAPILLARY: 215 mg/dL — AB (ref 70–99)
Glucose-Capillary: 180 mg/dL — ABNORMAL HIGH (ref 70–99)
Glucose-Capillary: 150 mg/dL — ABNORMAL HIGH (ref 70–99)
Glucose-Capillary: 253 mg/dL — ABNORMAL HIGH (ref 70–99)

## 2014-05-24 LAB — BASIC METABOLIC PANEL
BUN: 18 mg/dL (ref 6–23)
CHLORIDE: 84 mmol/L — AB (ref 96–112)
CO2: >50 mmol/L (ref 19–32)
CREATININE: 0.84 mg/dL (ref 0.50–1.10)
Calcium: 9 mg/dL (ref 8.4–10.5)
GFR calc Af Amer: 86 mL/min — ABNORMAL LOW (ref >90)
GFR calc non Af Amer: 75 mL/min — ABNORMAL LOW (ref >90)
GLUCOSE: 193 mg/dL — AB (ref 70–99)
Potassium: 4.5 mmol/L (ref 3.5–5.1)
Sodium: 139 mmol/L (ref 135–145)

## 2014-05-24 MED ORDER — METHYLPREDNISOLONE SODIUM SUCC 125 MG IJ SOLR
50.0000 mg | Freq: Two times a day (BID) | INTRAMUSCULAR | Status: AC
Start: 2014-05-24 — End: 2014-05-24
  Administered 2014-05-24: 50 mg via INTRAVENOUS

## 2014-05-24 MED ORDER — INSULIN ASPART 100 UNIT/ML ~~LOC~~ SOLN
0.0000 [IU] | Freq: Three times a day (TID) | SUBCUTANEOUS | Status: DC
  Administered 2014-05-24: 1 [IU] via SUBCUTANEOUS
  Administered 2014-05-24: 3 [IU] via SUBCUTANEOUS
  Administered 2014-05-24: 2 [IU] via SUBCUTANEOUS
  Administered 2014-05-25: 1 [IU] via SUBCUTANEOUS
  Administered 2014-05-25: 2 [IU] via SUBCUTANEOUS
  Administered 2014-05-25: 3 [IU] via SUBCUTANEOUS
  Administered 2014-05-26: 2 [IU] via SUBCUTANEOUS
  Administered 2014-05-26: 5 [IU] via SUBCUTANEOUS
  Administered 2014-05-27 (×2): 1 [IU] via SUBCUTANEOUS

## 2014-05-24 MED ORDER — PANTOPRAZOLE SODIUM 20 MG PO TBEC
20.0000 mg | DELAYED_RELEASE_TABLET | Freq: Every day | ORAL | Status: DC
  Administered 2014-05-24 – 2014-05-27 (×4): 20 mg via ORAL
  Filled 2014-05-24 (×4): qty 1

## 2014-05-24 MED ORDER — DIPHENHYDRAMINE HCL 25 MG PO CAPS
25.0000 mg | ORAL_CAPSULE | Freq: Every evening | ORAL | Status: DC | PRN
  Administered 2014-05-24: 25 mg via ORAL
  Filled 2014-05-24: qty 1

## 2014-05-24 MED ORDER — FUROSEMIDE 10 MG/ML IJ SOLN
20.0000 mg | Freq: Two times a day (BID) | INTRAMUSCULAR | Status: DC
  Administered 2014-05-24 (×2): 20 mg via INTRAVENOUS
  Filled 2014-05-24 (×4): qty 2

## 2014-05-24 NOTE — Progress Notes (Addendum)
Pt. Awaken from sleep thinking she see's her granddaughter in hall. Refuses to listen when reorienting. Pt getting very agitated and standing up beside bed.  Pt knows her name, DOB and that she is in [facility] hospital.  Called daughter, and pt talked with her.  Pt calmed down and was corroborative and apologized.  Bed alarm on.  Will continue to monitor. notified md. Saunders Revel T

## 2014-05-24 NOTE — Progress Notes (Signed)
Patient's CO2 >50.  Informed MD Ahmed of same.  MD asked about patient's glucose, advised lab value is 193.  Vicie Mutters, RN

## 2014-05-24 NOTE — Progress Notes (Signed)
Subjective: Pt reports feeling better.  States she has been coughing a lot, describes sputum as clear. C/o pain along the R rib cage underneath her breast.  The pain sometimes radiates to the sternum.  Pt states the pain is sporadic and doesn't recall any particular trigger.  Denies sweating, increased shortness of breath or radiating pain in arm.          Objective: Vital signs in last 24 hours: Filed Vitals:   05/23/14 2340 05/24/14 0400 05/24/14 0404 05/24/14 0719  BP:   135/85 135/78  Pulse:   86 82  Temp:   98.1 F (36.7 C) 98.7 F (37.1 C)  TempSrc:   Oral Oral  Resp:   19 20  Height:  _0  (1.651 m)    Weight:  113.7 kg (250 lb 10.6 oz)    SpO2: 88%  89% 92%   Weight change: -9.518 kg (-20 lb 15.7 oz).  Initial weight is likely incorrect.  Past recent measures have pt's baseline weight around 252-254 lbs.  A weight change of approx 4 lbs is  more likely.      Intake/Output Summary (Last 24 hours) at 05/24/14 0837 Last data filed at 05/24/14 0600  Gross per 24 hour  Intake   1110 ml  Output   1190 ml  Net    -80 ml   General appearance: alert, cooperative and sitting at the side of the bed, eating breakfast Lungs: Lower left lobe crackles, clear to ausculation in other lung fields Heart: regular rate and rhythm, S1, S2 normal, no murmur, click, rub or gallop. Tenderness to palpation over sternum.   Abdomen: soft, non tender with normal bowel sounds Extremities: no calf tenderness bilaterally.  Residual edema still present  Micro Results: Sputum Culture pending  Studies/Results: Dg Chest Port 1 View  05/22/2014   CLINICAL DATA:  Intermittent shortness of breath since 05/18/2014.  EXAM: PORTABLE CHEST - 1 VIEW  COMPARISON:  PA and lateral chest 04/10/2014 and 03/18/2014.  FINDINGS: Cardiomegaly and vascular congestion appear unchanged. No consolidative process, pneumothorax or effusion is identified. No focal bony abnormality is noted.  IMPRESSION: No acute abnormality.   Cardiomegaly and chronic vascular congestion.   Electronically Signed   By: Inge Rise M.D.   On: 05/22/2014 20:02   Medications: I have reviewed the patient's current medications. Scheduled Meds: . antiseptic oral rinse  7 mL Mouth Rinse BID  . aspirin  81 mg Oral Daily  . enoxaparin (LOVENOX) injection  0.5 mg/kg Subcutaneous Q24H  . insulin aspart  0-9 Units Subcutaneous TID WC  . ipratropium-albuterol  3 mL Nebulization Q4H  . levofloxacin (LEVAQUIN) IV  750 mg Intravenous Q24H  . methylPREDNISolone (SOLU-MEDROL) injection  50 mg Intravenous 3 times per day  . mometasone-formoterol  2 puff Inhalation BID  . pravastatin  20 mg Oral q1800  . senna-docusate  2 tablet Oral QHS  . sodium chloride  3 mL Intravenous Q12H   PRN Meds:.acetaminophen **OR** acetaminophen, guaiFENesin-dextromethorphan, ibuprofen, ipratropium-albuterol  Assessment/Plan: 59 yo female with history of diastolic CHF, tobacco abuse and gold stage IV COPD admitted with acute on chronic hypoxic, hypercarbic respiratory failure. Pt continues to improve with current treatment.    #Acute on chronic hypoxic, hypercarbic resp failure:   Treating for both COPD and CHF exacerbation - Two doses of Lasix 30m, 1st dose @ 8:45am - Strict i/o, has foley, down 23257mafter first 2 lasix doses, net output since admission -262546m Treat for COPD exacerbation with  duonebs q4hr + dulera (in place of advair) + solumedrol 40m q8hr + Levaquin 750 mg daily - Sputum cx pending - Continue Bipap and/or partial non-rebreather as needed-begin weaning of oxygen to nasal canula - Neuro checks q4hr to monitor mental status while on bipap  #Elevated Glucose -glucose levels remain elevated since admission, most recent was 193.   -Sliding scale insulin started   #Tobacco abuse: 1/2 pack daily. Quit 5 days prior to admission due to breathing status. - Encouraged continued tobacco cessation - Refused nicotine patch  #CAD: S/P PCI of  LAD with DES (12/2008). Total occlusion of RCA noted at that time, medically managed. - Continue with added asa 880mdaily in context of stent -troponin labs and EKG ordered due to pt complaints of chest pain.  R/o MI  #Constipation: chronic  - Continue senokot-S  #DVT Ppx: Lovenox  #Diet: Patient allowed to eat (can take a break from bipap if necessary) - Regular diet   This is a [employer] information officerote.  The care of the patient was discussed with Dr. BhEllwood Densend the assessment and plan formulated with their assistance.  Please see their attached note for official documentation of the daily encounter.   LOS: 2 days   KeClois DupesMed Student 05/24/2014, 8:37 AM

## 2014-05-24 NOTE — Progress Notes (Signed)
  PROGRESS NOTE MEDICINE TEACHING ATTENDING   Day 2 of stay Patient name: [name]   Medical record number: [id number] Date of birth: [birth year]   [name] reports breathing better this morning. She had a bout of chest pain, right sided under her breast last night, shooting up to her sternum. She does not associate it with nausea, vomiting or diaphoresis. It does not radiate up to her jaw or arm. It was at rest. It is resolved now.   Vitals: Blood pressure 135/78, pulse 82, temperature 98.7 F (37.1 C), temperature source Oral, resp. rate 20, height _0  (1.651 m), weight 250 lb 10.6 oz (113.7 kg), SpO2 90 %. General: Comfortable, sitting up in bed, no distress.  HEENT: PERRL, EOMI, no scleral icterus. Chest: Right inframammary tenderness. No tenderness over sternum.  Heart: RRR, no rubs, murmurs or gallops. Lungs: Low air movement, mild basal crackles on right side (much improved from yesterday), no wheezes, rales, or rhonchi. Abdomen: Soft, nontender, nondistended, BS present. Extremities: Warm, mild pedal edema present.  Brief Assessment and Plan  EKG reviewed. ST depression on precordial leads with persistent TWIs. Will get troponins and serial EKGs. Will consult cardiology if chest pain returns, EKG shows persistent changes or troponin is positive.   I do not think this right sided pain is liver or Gb pathology as liver enzymes have been normal and there are no associated symptoms of nausea or vomiting. If it persists, we will repeat liver enzymes and USG GB as needed.   I will try a PPI to see if it helps given the patient is on steroids and prone to gastritis.   Continue antibiotics. Would monitor QTc on levofloxacin - now 457.  CHF exacerbation - lungs better, breathing better. Weight loss from 269 lb on admission to 254lb today. Will continue diuretics    I have discussed the care of this patient with my IM team residents - Dr Redmond Pulling. Please see the resident note for  details.  [name], [name] 05/24/2014, 10:35 AM.

## 2014-05-24 NOTE — Progress Notes (Signed)
Subjective: [name] had an episode overnight where she thought she saw a mouse in the room.  She was reassured that there was not a mouse and went back to sleep.  This morning she is awake and alert, sitting up eating breakfast.  She says she is feeling better but reports chest pain.  Says she has had it off and on for the past two weeks.  It starts below right rib cage and travels to the middle of her chest.  She has not had radiation of the pain.   Objective: Vital signs in last 24 hours: Filed Vitals:   05/23/14 2340 05/24/14 0400 05/24/14 0404 05/24/14 0719  BP:   135/85 135/78  Pulse:   86 82  Temp:   98.1 F (36.7 C) 98.7 F (37.1 C)  TempSrc:   Oral Oral  Resp:   19 20  Height:  _0  (1.651 m)    Weight:  113.7 kg (250 lb 10.6 oz)    SpO2: 88%  89% 92%   Weight change: -9.518 kg (-20 lb 15.7 oz)  Intake/Output Summary (Last 24 hours) at 05/24/14 0805 Last data filed at 05/24/14 0600  Gross per 24 hour  Intake   1110 ml  Output   1190 ml  Net    -80 ml   Physical Exam: Appearance: sitting up on side of bed in NAD, talkative, venturi in place (she moves it at times in order to take sips of juice) HEENT: [name]/AT Heart: RRR, normal S1S2, no MRG, sternum TTP Lungs: on venturi, in no respiratory distress, no wheezes, crackles in the left lung base Abdomen: BS+, soft, nontender, no organomegaly Extremities: normal range of motion, trace pitting edema R>L, right leg bigger than left (this is chronic for her), no tenderness Neurologic: A&Ox3, grossly intact  Lab Results: Basic Metabolic Panel:  Recent Labs Lab 05/23/14 0120 05/24/14 0347  NA 143 139  K 3.1* 4.5  CL 84* 84*  CO2 >50* >50*  GLUCOSE 257* 193*  BUN <5* 18  CREATININE 0.80 0.84  CALCIUM 9.0 9.0  MG 1.4*  --    CBC:  Recent Labs Lab 05/23/14 0120 05/24/14 0347  WBC 8.6 11.5*  HGB 13.0 12.3  HCT 42.3 40.0  MCV 99.5 99.3  PLT 133* 158   CBG:  Recent Labs Lab 05/23/14 0729 05/24/14 0716    GLUCAP 190* 180*    Medications: I have reviewed the patient's current medications. Scheduled Meds: . antiseptic oral rinse  7 mL Mouth Rinse BID  . aspirin  81 mg Oral Daily  . enoxaparin (LOVENOX) injection  0.5 mg/kg Subcutaneous Q24H  . insulin aspart  0-9 Units Subcutaneous TID WC  . ipratropium-albuterol  3 mL Nebulization Q4H  . levofloxacin (LEVAQUIN) IV  750 mg Intravenous Q24H  . methylPREDNISolone (SOLU-MEDROL) injection  50 mg Intravenous 3 times per day  . mometasone-formoterol  2 puff Inhalation BID  . pravastatin  20 mg Oral q1800  . senna-docusate  2 tablet Oral QHS  . sodium chloride  3 mL Intravenous Q12H   Continuous Infusions: none  PRN Meds:.acetaminophen **OR** acetaminophen, guaiFENesin-dextromethorphan, ibuprofen, ipratropium-albuterol   Assessment/Plan: 59 yo female with history of diastolic CHF, tobacco abuse and gold stage IV COPD admitted overnight with acute on chronic hypoxic, hypercarbic respiratory failure. Responded well to lasix and bipap.  Acute on chronic hypoxic,hypercarbic resp failure: Similar to prior admissions, dyspnea appears to be combination of HF exacerbation and COPD exacerbation due to non-compliance with medications.  She admits to not regularly taking Lasix because it makes her urinate too much.  Also, reports she has not had her albuterol inhaler due to insurance coverage issues.  Reports compliance with 4L oxygen at home but has had to increase to 5L recently.  She says she quit smoking 1 week ago which is a big step.  Repeat ABG yesterday afternoon had improved CO2 to 79 (baseline 70s).  She has not required BiPAP since admission.  Initial weight was likely inaccurate (recorded as 269 and then 254 1 hour apart).  It is more likely that she was 254 pounds at admission since she is 250 today.  Baseline weight appears to be around 250 (this was her weight at discharge after prior admission).  Net fluid off is 2.4L.  She feels better and is  likely near baseline. - Lasix 65m IV BID today; reassess and consider resuming home po dose tomorrow - Strict i/o and daily weights - duonebs q4hr, dulera (in place of advair), Levaquin 750 mg daily - decrease solumedrol from 5105mTID to BID, can likely switch to po pred tomorrow - Sputum cx pending - Continue Bipap and/or partial non-rebreather as needed  Tobacco abuse: 1/2 pack daily. Quit 5 days prior to admission due to breathing status. - Encouraged continued tobacco cessation - declined nicotine patch  Chest pain in patient w/CAD: S/P PCI of LAD with DES (12/2008). Total occlusion of RCA noted at that time, medically managed.  ASA added this admission.  The patient is eating breakfast, in no distress, chest pain is reproducible on exam making me less suspicious for ACS.  Telemetry reviewed and there are respiratory alarms for desaturation but no alarms for cardiac abnormalities.  It is likely that the pain is musculoskeletal related to coughing. Stat EKG shows ? depressed ST in V1-V5, new TWI in III.  RN reports patient denied chest pain during EKG.   - awaiting stat troponin and will continue to trend (x 3 total) - repeating EKG - will consult cardiology if significant troponin elevation   Constipation: chronic  - Continue senokot-S  DVT Ppx: [place] Lovenox  Diet: heart healthy  Full code including intubation.  Dispo: Disposition is deferred at this time, awaiting improvement of current medical problems.  Anticipated discharge in approximately 1-2 day(s).   The patient does have a current PCP ([name]) and [facility].  The patient does not have transportation limitations that hinder transportation to clinic appointments.  .Services Needed at time of discharge: Y = Yes, Blank = No PT:   OT:   RN:   Equipment:   Other:     LOS: 2 days   Morgan OmanDO 05/24/2014, 8:05 AM

## 2014-05-24 NOTE — Progress Notes (Signed)
EKG CRITICAL VALUE     12 lead EKG performed.  Critical value noted. Tanda Rockers, RN notified.   Yehuda Mao, [place] 05/24/2014 12:25 PM

## 2014-05-24 NOTE — Progress Notes (Signed)
Patient called RN to her room, stated "there is a mouse in the corner of the ceiling". Patient also asked me if I saw water running down the wall. Patient oriented to person, place, and time.  Reassured patient that there was no mouse and no water. Asked patient if she ever saw mice at home, she stated yes but that was because there are mice at her home. Patient stated she believed me that there was no mouse and she would go back to sleep. Called MD Ahmed and informed of same and that patient was back to sleep.  Vicie Mutters, RN

## 2014-05-24 NOTE — Progress Notes (Signed)
Pt continues to hallucinate. Pt is oriented to time, but states she is not in the right room and needs to be taken to her room. Pt also states that she was given money by a friend and ran down the hall to hide it in another room. Pt is seeing bunnies and rain on the walls. MD was notified of the pts behavior. MD ordered an ABG. Will continue to monitor and orient pt.

## 2014-05-25 DIAGNOSIS — I509 Heart failure, unspecified: Secondary | ICD-10-CM

## 2014-05-25 DIAGNOSIS — F99 Mental disorder, not otherwise specified: Secondary | ICD-10-CM

## 2014-05-25 LAB — BASIC METABOLIC PANEL
BUN: 22 mg/dL (ref 6–23)
CO2: >50 mmol/L (ref 19–32)
Calcium: 8.9 mg/dL (ref 8.4–10.5)
Chloride: 87 mmol/L — ABNORMAL LOW (ref 96–112)
Creatinine, Ser: 0.86 mg/dL (ref 0.50–1.10)
GFR calc Af Amer: 84 mL/min — ABNORMAL LOW (ref >90)
GFR calc non Af Amer: 73 mL/min — ABNORMAL LOW (ref >90)
GLUCOSE: 184 mg/dL — AB (ref 70–99)
Potassium: 3.8 mmol/L (ref 3.5–5.1)
SODIUM: 139 mmol/L (ref 135–145)

## 2014-05-25 LAB — GLUCOSE, CAPILLARY
Glucose-Capillary: 155 mg/dL — ABNORMAL HIGH (ref 70–99)
Glucose-Capillary: 146 mg/dL — ABNORMAL HIGH (ref 70–99)
Glucose-Capillary: 245 mg/dL — ABNORMAL HIGH (ref 70–99)
Glucose-Capillary: 222 mg/dL — ABNORMAL HIGH (ref 70–99)

## 2014-05-25 LAB — CBC WITH DIFFERENTIAL/PLATELET
BASOS ABS: 0 10*3/uL (ref 0.0–0.1)
Basophils Relative: 0 % (ref 0–1)
Eosinophils Absolute: 0 10*3/uL (ref 0.0–0.7)
Eosinophils Relative: 0 % (ref 0–5)
HCT: 41.1 % (ref 36.0–46.0)
HEMOGLOBIN: 13 g/dL (ref 12.0–15.0)
Lymphocytes Relative: 5 % — ABNORMAL LOW (ref 12–46)
Lymphs Abs: 0.5 10*3/uL — ABNORMAL LOW (ref 0.7–4.0)
MCH: 30.8 pg (ref 26.0–34.0)
MCHC: 31.6 g/dL (ref 30.0–36.0)
MCV: 97.4 fL (ref 78.0–100.0)
Monocytes Absolute: 0.8 10*3/uL (ref 0.1–1.0)
Monocytes Relative: 8 % (ref 3–12)
NEUTROS PCT: 87 % — AB (ref 43–77)
Neutro Abs: 8.8 10*3/uL — ABNORMAL HIGH (ref 1.7–7.7)
Platelets: 153 10*3/uL (ref 150–400)
RBC: 4.22 MIL/uL (ref 3.87–5.11)
RDW: 14.7 % (ref 11.5–15.5)
WBC: 10.1 10*3/uL (ref 4.0–10.5)

## 2014-05-25 MED ORDER — LEVOFLOXACIN 750 MG PO TABS
750.0000 mg | ORAL_TABLET | Freq: Every day | ORAL | Status: DC
  Administered 2014-05-25 – 2014-05-26 (×2): 750 mg via ORAL
  Filled 2014-05-25 (×3): qty 1

## 2014-05-25 MED ORDER — PREDNISONE 20 MG PO TABS
40.0000 mg | ORAL_TABLET | Freq: Every day | ORAL | Status: DC
  Filled 2014-05-25: qty 2

## 2014-05-25 MED ORDER — FUROSEMIDE 40 MG PO TABS
40.0000 mg | ORAL_TABLET | Freq: Two times a day (BID) | ORAL | Status: DC
  Administered 2014-05-25 – 2014-05-27 (×4): 40 mg via ORAL
  Filled 2014-05-25 (×7): qty 1

## 2014-05-25 MED ORDER — PREDNISONE 20 MG PO TABS
40.0000 mg | ORAL_TABLET | Freq: Every day | ORAL | Status: DC
  Administered 2014-05-25 – 2014-05-26 (×2): 40 mg via ORAL
  Filled 2014-05-25 (×3): qty 2

## 2014-05-25 MED ORDER — LORAZEPAM 2 MG/ML IJ SOLN
1.0000 mg | Freq: Once | INTRAMUSCULAR | Status: AC
  Administered 2014-05-25: 1 mg via INTRAVENOUS

## 2014-05-25 MED ORDER — LORAZEPAM 2 MG/ML IJ SOLN
INTRAMUSCULAR | Status: AC
  Filled 2014-05-25: qty 1

## 2014-05-25 MED ORDER — IPRATROPIUM-ALBUTEROL 0.5-2.5 (3) MG/3ML IN SOLN
3.0000 mL | Freq: Three times a day (TID) | RESPIRATORY_TRACT | Status: DC
  Administered 2014-05-25 – 2014-05-27 (×6): 3 mL via RESPIRATORY_TRACT
  Filled 2014-05-25 (×5): qty 3

## 2014-05-25 NOTE — Progress Notes (Signed)
Pt very agitated and pulling off  o2 and leads. Pt o2 level down to 60-70's.  Getting more agitated.  Tried to  Reorient and reason with pt.  Unable to.  Md paged.  Daughter called.  Security called.  Pt wanting to leave.  Saunders Revel T

## 2014-05-25 NOTE — Progress Notes (Signed)
Took over from the outgoing RN, pt is asleep.

## 2014-05-25 NOTE — Progress Notes (Signed)
Assisted pt up to [facility].  Pt stated " go tell my son to come back in here.  He's right out there.  "  Reoriented pt and she gets little agitated but follows commands.  Bed alarm on.  Will continue to monitor. Saunders Revel T

## 2014-05-25 NOTE — Progress Notes (Signed)
Spoke with Dr. Radford Pax concerning patients increasing confusion, agitation, and hallucinations. Encouraged Dr. Radford Pax to send someone to come and assess the patient. Patient continues to remove O2. O2 sats 70-60% on RA. When O2 is on O2 sats 87-90%.

## 2014-05-25 NOTE — Progress Notes (Signed)
Echocardiogram 2D Echocardiogram has been performed.  Doyle Askew 05/25/2014, 3:41 PM

## 2014-05-25 NOTE — Progress Notes (Signed)
  PROGRESS NOTE MEDICINE TEACHING ATTENDING   Day 3 of stay Patient name: [name]   Medical record number: [id number] Date of birth: 1955/05/13   Met and examined patient. Although appears to be clinically improving, she is disoriented to time, person and place. She is talking but not meaningful. Does not report chest pain.  Blood pressure 129/68, pulse 77, temperature 98.2 F (36.8 C), temperature source Oral, resp. rate 18, height _0  (1.651 m), weight 247 lb 2.2 oz (112.1 kg), SpO2 94 %. Heart - regular rhythm Chest - no tenderness Abdomen - soft Lungs - examined lying - rales all fields. Pedal edema mild.   Treatment in the right direction, although acute delirium or psychosis likely due to steroids or sedating medicines or acute condition? Patient does not have a history of dementia. Currently - will try to give her time, relaxing environment, try reorientation through family interaction, might not be a good idea to move her to a different room at this time because change in environment might exacerbate mental condition. However it can be done if absolutely necessary. Discussed this with Dr Redmond Pulling.     I have discussed the care of this patient with my IM team residents. Please see the resident Dr Dois Davenport note note for details.  [place], [place] 05/25/2014, 12:07 PM.

## 2014-05-25 NOTE — Progress Notes (Signed)
Pt educated to keep right arm straight for iv antbx.  Refuses and requesting iv meds to be turned off.  Notified Md about request and meds being turned off. Will continue to monitor. Saunders Revel T

## 2014-05-25 NOTE — Progress Notes (Signed)
Pt pulled lead and o2 probe off.  Keeps pulling at things.  Pt talking about food in her room that we brought in.   Reoriented pt that nobody's been in her room and there is no food.  Will continue to monitor. Notified Md.  Saunders Revel T

## 2014-05-25 NOTE — Progress Notes (Signed)
Patient continues to refuse to wear O2, monitoring equipment. Family medicine left without declaring patient incompetent or ordering meds. Daughter has been called. MDs paged.

## 2014-05-25 NOTE — Progress Notes (Signed)
Subjective: RNs report patient with hallucinations and paranoid thinking over night.  She reportedly tried to remove her oxygen.  The patient was seen this AM and is alert and oriented but then mentions that the doctors last night were going to let her go out on a boat.  She is calm and says that she would like to go home and sleep in her own bed tonight.  She agrees to stay in the hospital today so I can see how she responds to po medications.  Objective: Vital signs in last 24 hours: Filed Vitals:   05/24/14 2005 05/24/14 2306 05/25/14 0302 05/25/14 0335  BP:  145/102 162/83   Pulse:  77    Temp:  98.9 F (37.2 C) 97.7 F (36.5 C)   TempSrc:  Oral Oral   Resp:   20   Height:      Weight:    112.1 kg (247 lb 2.2 oz)  SpO2: 91% 90% 94% 92%   Weight change: -0.4 kg (-14.1 oz)  Intake/Output Summary (Last 24 hours) at 05/25/14 7588 Last data filed at 05/25/14 0100  Gross per 24 hour  Intake    555 ml  Output   3375 ml  Net  -2820 ml   Physical Exam: Appearance: sitting up on side of bed in NAD, calm, nasal cannula in place HEENT: [place]/AT Heart: RRR, normal S1S2, no MRG Lungs: on nasal cannula 4.5L, in no respiratory distress, no wheezes/rales/rhonchi Abdomen: BS+, soft, nontender, nondistended Extremities: trace pitting edema R>L, right leg bigger than left (this is chronic for her), no tenderness Neurologic: A&Ox3, following commands, reports the doctors last night told her she could go out on a boat (clearly inaccurate)  Lab Results: Basic Metabolic Panel:  Recent Labs Lab 05/23/14 0120 05/24/14 0347 05/25/14 0322  NA 143 139 139  K 3.1* 4.5 3.8  CL 84* 84* 87*  CO2 >50* >50* >50*  GLUCOSE 257* 193* 184*  BUN <5* 18 22  CREATININE 0.80 0.84 0.86  CALCIUM 9.0 9.0 8.9  MG 1.4*  --   --    CBC:  Recent Labs Lab 05/24/14 0347 05/25/14 0322  WBC 11.5* 10.1  NEUTROABS  --  8.8*  HGB 12.3 13.0  HCT 40.0 41.1  MCV 99.3 97.4  PLT 158 153   CBG:  Recent  Labs Lab 05/23/14 0729 05/24/14 0716 05/24/14 1210 05/24/14 1712 05/24/14 2116  GLUCAP 190* 180* 215* 150* 253*    Medications: I have reviewed the patient's current medications. Scheduled Meds: . antiseptic oral rinse  7 mL Mouth Rinse BID  . aspirin  81 mg Oral Daily  . enoxaparin (LOVENOX) injection  0.5 mg/kg Subcutaneous Q24H  . furosemide  40 mg Oral BID  . insulin aspart  0-9 Units Subcutaneous TID WC  . ipratropium-albuterol  3 mL Nebulization Q4H  . levofloxacin  750 mg Oral Daily  . mometasone-formoterol  2 puff Inhalation BID  . pantoprazole  20 mg Oral Daily  . pravastatin  20 mg Oral q1800  . predniSONE  40 mg Oral Daily  . senna-docusate  2 tablet Oral QHS  . sodium chloride  3 mL Intravenous Q12H   Continuous Infusions: none  PRN Meds:.acetaminophen **OR** acetaminophen, guaiFENesin-dextromethorphan, ibuprofen, ipratropium-albuterol   Assessment/Plan: 59 yo female with history of diastolic CHF, tobacco abuse and gold stage IV COPD admitted overnight with acute on chronic hypoxic, hypercarbic respiratory failure. Responded well to lasix and bipap.  Acute on chronic hypoxic,hypercarbic resp failure:  From  a respiratory standpoint she has improved.  She has been weaned to 4.5L nasal cannula (her home oxygen is 4L), she is down 5L and her weight is down 7 pounds (currently 247 pounds, baseline is 250 pounds).  She reports breathing better.  She has oxygen at home.  I think her confusion and hallucinations during admission are likely due to high dose steroids and exacerbated by Ativan and Benadryl that she received overnight for sleep. - will stop IV Lasix today and resume home Lasix po 40m BID - ambulate and check oxygen saturations - Strict i/o and daily weights - duonebs q4hr, dulera (in place of advair), Levaquin 750 mg daily (stop IV and start po today x 2 more days for 5 day total) - stop IV Solumedrol and start po prednisone 450mdaily x 2 more days (5 day total  steroid) - avoid Benadryl and Ativan - Sputum cx pending - continue Bipap and/or partial non-rebreather as needed  Tobacco abuse: 1/2 pack daily. Quit 5 days prior to admission due to breathing status. - encouraged continued tobacco cessation - declined nicotine patch  Resolved chest pain in patient w/CAD: S/P PCI of LAD with DES (12/2008). Total occlusion of RCA noted at that time, medically managed.  ASA added this admission.  She reported chest pain yesterday morning that resolved.  Serial EKGs yesterday revealed depressed ST segments in V2-V6.  Troponin negative x 3.  The patient denies pain this AM.  Review of telemetry reveals respiratory alarms but no cardiac alarms.  Patient was no longer on telemetry this AM and will keep off to prevent agitation/anxiety.  I am not concerned for ACS given atypical pain, negative serial troponin.   - repeating EKG this AM - she will need cardiology follow-up  Constipation: chronic  - Continue senokot-S  DVT Ppx: [place] Lovenox  Diet: heart healthy  Full code including intubation.  Dispo: Disposition is deferred at this time, awaiting improvement of current medical problems.  Anticipated discharge in approximately 0-1 day(s).   The patient does have a current PCP (LKarren CobbleMD) and [facility].  The patient does not have transportation limitations that hinder transportation to clinic appointments.  .Services Needed at time of discharge: Y = Yes, Blank = No PT:   OT:   RN:   Equipment:   Other:     LOS: 3 days   AlFrancesca OmanDO 05/25/2014, 8:03 AM

## 2014-05-26 DIAGNOSIS — Z9981 Dependence on supplemental oxygen: Secondary | ICD-10-CM

## 2014-05-26 LAB — BASIC METABOLIC PANEL
Anion gap: 3 — ABNORMAL LOW (ref 5–15)
BUN: 21 mg/dL (ref 6–23)
CO2: 48 mmol/L (ref 19–32)
Calcium: 8.6 mg/dL (ref 8.4–10.5)
Chloride: 88 mmol/L — ABNORMAL LOW (ref 96–112)
Creatinine, Ser: 0.8 mg/dL (ref 0.50–1.10)
GFR calc non Af Amer: 79 mL/min — ABNORMAL LOW (ref >90)
Glucose, Bld: 178 mg/dL — ABNORMAL HIGH (ref 70–99)
Potassium: 4.3 mmol/L (ref 3.5–5.1)
SODIUM: 139 mmol/L (ref 135–145)

## 2014-05-26 LAB — CBC WITH DIFFERENTIAL/PLATELET
Basophils Absolute: 0 10*3/uL (ref 0.0–0.1)
Basophils Relative: 0 % (ref 0–1)
EOS PCT: 0 % (ref 0–5)
Eosinophils Absolute: 0 10*3/uL (ref 0.0–0.7)
HCT: 42.8 % (ref 36.0–46.0)
Hemoglobin: 13.4 g/dL (ref 12.0–15.0)
Lymphocytes Relative: 8 % — ABNORMAL LOW (ref 12–46)
Lymphs Abs: 0.7 10*3/uL (ref 0.7–4.0)
MCH: 30.7 pg (ref 26.0–34.0)
MCHC: 31.3 g/dL (ref 30.0–36.0)
MCV: 98.2 fL (ref 78.0–100.0)
Monocytes Absolute: 0.8 10*3/uL (ref 0.1–1.0)
Monocytes Relative: 10 % (ref 3–12)
Neutro Abs: 7 10*3/uL (ref 1.7–7.7)
Neutrophils Relative %: 82 % — ABNORMAL HIGH (ref 43–77)
Platelets: 144 10*3/uL — ABNORMAL LOW (ref 150–400)
RBC: 4.36 MIL/uL (ref 3.87–5.11)
RDW: 14.8 % (ref 11.5–15.5)
WBC: 8.5 10*3/uL (ref 4.0–10.5)

## 2014-05-26 LAB — HIV ANTIBODY (ROUTINE TESTING W REFLEX): HIV Screen 4th Generation wRfx: NONREACTIVE

## 2014-05-26 LAB — GLUCOSE, CAPILLARY
GLUCOSE-CAPILLARY: 265 mg/dL — AB (ref 70–99)
Glucose-Capillary: 102 mg/dL — ABNORMAL HIGH (ref 70–99)
Glucose-Capillary: 185 mg/dL — ABNORMAL HIGH (ref 70–99)
Glucose-Capillary: 243 mg/dL — ABNORMAL HIGH (ref 70–99)

## 2014-05-26 NOTE — Progress Notes (Signed)
CRITICAL VALUE ALERT  Critical value received: CO2 48  Date of notification:  05/26/14  Time of notification:  6546  Critical value read back:Yes.    Nurse who received alert:  [place], RN  MD notified (1st page):  Dr. Genene Churn  Time of first page:  [phone]  Responding MD:  Dr. Genene Churn  Time MD responded:  [phone]  MD made aware of lab trends, no new orders at this time. Will continue to monitor patient.

## 2014-05-26 NOTE — Progress Notes (Addendum)
  PROGRESS NOTE MEDICINE TEACHING ATTENDING   Day 4 of stay Patient name: [name]   Medical record number: [id number] Date of birth: 1955-08-30   Met with Ms Yankovich. Completely oriented today. Yesterday's disorientation likely due to ativan and benadryl. Reports doing better. No CP. Wants to go home. Blood pressure 108/62, pulse 94, temperature 98.1 F (36.7 C), temperature source Oral, resp. rate 20, height _0  (1.651 m), weight 247 lb 9.2 oz (112.3 kg), SpO2 91 %. Looks tired, deconditioned. Lung exam - crackly bases bilateral. Heart regular rate and rhythm, normal heart sounds. Pedal edema same as before 1+ pitting. The patient could be moved out of step down unit today to a regular room, ambulated with nursing care, and if no deterioration in condition might be discharged by the end of the day or tomorrow morning with follow up with PCP this week.   I have discussed the care of this patient with my IM team residents - Dr Hulen Luster and Eulas Post. Please see the resident note for details. [place], [place] 05/26/2014, 11:43 AM.

## 2014-05-26 NOTE — Progress Notes (Signed)
Inpatient Diabetes Program Recommendations  AACE/ADA: New Consensus Statement on Inpatient Glycemic Control (2013)  Target Ranges:  Prepandial:   less than 140 mg/dL      Peak postprandial:   less than 180 mg/dL (1-2 hours)      Critically ill patients:  140 - 180 mg/dL   Pt on sensitive correction tidwc while on Prednisone/steroid therapy. Please also consider:  Thank you Rosita Kea, RN, MSN, CDE  Diabetes Inpatient Program Office: [phone] Pager: [phone]

## 2014-05-26 NOTE — Progress Notes (Signed)
Subjective: Pt reports doing well overnight, no troubles breathing.  States she would sleep better if there was no one bothering her every 3-4 hrs.    Objective: Vital signs in last 24 hours: Filed Vitals:   05/26/14 0027 05/26/14 0500 05/26/14 0730 05/26/14 0818  BP: 123/104 139/73 108/62   Pulse:      Temp:  98.1 F (36.7 C) 98 F (36.7 C)   TempSrc:  Oral Oral   Resp: 20  20   Height:      Weight:  112.3 kg (247 lb 9.2 oz)    SpO2: 92% 92% 91% 95%   Weight change: 0.2 kg (7.1 oz)  Intake/Output Summary (Last 24 hours) at 05/26/14 2482 Last data filed at 05/26/14 0400  Gross per 24 hour  Intake    156 ml  Output   2450 ml  Net  -2294 ml   General appearance: alert, cooperative, no distress and sitting on bedside, eating breakfast Lungs: Lower left lobe with crackles, clear to auscultation in other lobes Heart: regular rate and rhythm, S1, S2 normal, no murmur, click, rub or gallop Abdomen: soft, non-tender; bowel sounds normal Extremities: mild residual edema, decreased since admission Neurologic: Grossly normal  Lab Results: Sputum culture pending  Micro Results: Recent Results (from the past 240 hour(s))  Urine culture     Status: None   Collection Time: 05/23/14 12:48 AM  Result Value Ref Range Status   Specimen Description URINE, CATHETERIZED  Final   Special Requests Normal  Final   Colony Count NO GROWTH Performed at [facility]   Final   Culture NO GROWTH Performed at [facility]   Final   Report Status 05/24/2014 FINAL  Final  MRSA PCR Screening     Status: None   Collection Time: 05/23/14  7:10 AM  Result Value Ref Range Status   MRSA by PCR NEGATIVE NEGATIVE Final    Comment:        The GeneXpert MRSA Assay (FDA approved for NASAL specimens only), is one component of a comprehensive MRSA colonization surveillance program. It is not intended to diagnose MRSA infection nor to guide or monitor treatment for MRSA  infections.    Studies/Results: 2D Echo with contrast 05/25/14 Study Conclusions - Left ventricle: The cavity size was normal. Wall thickness was increased in a pattern of mild LVH. Systolic function was normal. The estimated ejection fraction was in the range of 50% to 55%. Regional wall motion abnormalities cannot be excluded. Doppler parameters are consistent with abnormal left ventricular relaxation (grade 1 diastolic dysfunction). - Aortic valve: Valve area (Vmax): 2.59 cm^2. - Left atrium: The atrium was mildly dilated. - Right atrium: The atrium was mildly dilated. - Pericardium, extracardiac: A trivial pericardial effusion was identified posterior to the heart.  Medications: I have reviewed the patient's current medications. Scheduled Meds: . antiseptic oral rinse  7 mL Mouth Rinse BID  . aspirin  81 mg Oral Daily  . enoxaparin (LOVENOX) injection  0.5 mg/kg Subcutaneous Q24H  . furosemide  40 mg Oral BID  . insulin aspart  0-9 Units Subcutaneous TID WC  . ipratropium-albuterol  3 mL Nebulization TID  . levofloxacin  750 mg Oral Daily  . mometasone-formoterol  2 puff Inhalation BID  . pantoprazole  20 mg Oral Daily  . pravastatin  20 mg Oral q1800  . predniSONE  40 mg Oral Daily  . senna-docusate  2 tablet Oral QHS  . sodium chloride  3 mL Intravenous  Q12H   PRN Meds:.acetaminophen **OR** acetaminophen, guaiFENesin-dextromethorphan, ibuprofen, ipratropium-albuterol  Assessment/Plan: 59 yo female with history of diastolic CHF, tobacco abuse and gold stage IV COPD admitted with acute on chronic hypoxic, hypercarbic respiratory failure. Responded well to lasix and bipap.  Acute on chronic hypoxic,hypercarbic resp failure: From a respiratory standpoint she has improved. She has been weaned to 4.5L nasal cannula (her home oxygen is 4L), she is down 5L and her weight is down 7 pounds (currently 247 pounds, baseline is 250 pounds).   - pt on home Lasix po 26m  BID - ambulate and check oxygen saturations, pt seems doubtful that she'll tolerate it, but will try - Strict i/o and daily weights - duonebs q4hr, dulera (in place of advair), Levaquin 750 mg daily (stop IV and start po today x 2 more days for 5 day total) - po prednisone 410mdaily x 2 more days (5 day total steroid) - avoid Benadryl and Ativan-pt with improved cognition, confusion and hallucinations likely from Benadryl and Ativan - Sputum cx pending - continue Bipap and/or partial non-rebreather as needed.  Currently on nasal cannula, 4.5L  Tobacco abuse: 1/2 pack daily. Quit 5 days prior to admission due to breathing status. - encouraged continued tobacco cessation - declined nicotine patch  Resolved chest pain in patient w/CAD: S/P PCI of LAD with DES (12/2008). Total occlusion of RCA noted at that time, medically managed. ASA added this admission. She reported chest pain that has resolved. Serial EKGs yesterday revealed depressed ST segments in V2-V6. Troponin negative x 3. The patient denies pain this AM.  2D Echo showed no abnormalities and is unchanged from prior echo in Aug 2014   - she will need cardiology follow-up  Constipation: chronic  - Continue senokot-S  DVT Ppx: [place] Lovenox  Diet: heart healthy  This is a MeCare[employer] information officerote.  The care of the patient was discussed with Dr. BhEllwood Densend the assessment and plan formulated with their assistance.  Please see their attached note for official documentation of the daily encounter.   LOS: 4 days   KeClois DupesMed Student 05/26/2014, 9:09 AM

## 2014-05-26 NOTE — Progress Notes (Signed)
Subjective: Pt alert and oriented. States breathing is improved. Normally on 4L of O2 at home. States she will try and ambulate w/pulse ox today. Asking when she can go home.   Objective: Vital signs in last 24 hours: Filed Vitals:   05/26/14 0027 05/26/14 0500 05/26/14 0730 05/26/14 0818  BP: 123/104 139/73 108/62   Pulse:      Temp:  98.1 F (36.7 C) 98 F (36.7 C)   TempSrc:  Oral Oral   Resp: 20  20   Height:      Weight:  247 lb 9.2 oz (112.3 kg)    SpO2: 92% 92% 91% 95%   Weight change: 7.1 oz (0.2 kg)  Intake/Output Summary (Last 24 hours) at 05/26/14 1103 Last data filed at 05/26/14 0800  Gross per 24 hour  Intake    393 ml  Output   2450 ml  Net  -2057 ml   Physical Exam: Appearance: sitting up on side of bed in NAD, nasal cannula in place, calm and cooperative Heart: RRR, normal S1S2, no MRG Lungs: on nasal cannula 4L, in no respiratory distress, no wheezes/rales/rhonchi Abdomen: BS+, soft, nontender, nondistended Extremities: neg for pedal edema Neurologic: A&Ox3, affect appropriate  Lab Results: Basic Metabolic Panel:  Recent Labs Lab 05/23/14 0120  05/25/14 0322 05/26/14 0303  NA 143  < > 139 139  K 3.1*  < > 3.8 4.3  CL 84*  < > 87* 88*  CO2 >50*  < > >50* 48*  GLUCOSE 257*  < > 184* 178*  BUN <5*  < > 22 21  CREATININE 0.80  < > 0.86 0.80  CALCIUM 9.0  < > 8.9 8.6  MG 1.4*  --   --   --   < > = values in this interval not displayed. CBC:  Recent Labs Lab 05/25/14 0322 05/26/14 0303  WBC 10.1 8.5  NEUTROABS 8.8* 7.0  HGB 13.0 13.4  HCT 41.1 42.8  MCV 97.4 98.2  PLT 153 144*   CBG:  Recent Labs Lab 05/24/14 2116 05/25/14 0842 05/25/14 1147 05/25/14 1646 05/25/14 2131 05/26/14 0733  GLUCAP 253* 155* 146* 245* 222* 102*    Medications: I have reviewed the patient's current medications. Scheduled Meds: . antiseptic oral rinse  7 mL Mouth Rinse BID  . aspirin  81 mg Oral Daily  . enoxaparin (LOVENOX) injection  0.5 mg/kg  Subcutaneous Q24H  . furosemide  40 mg Oral BID  . insulin aspart  0-9 Units Subcutaneous TID WC  . ipratropium-albuterol  3 mL Nebulization TID  . levofloxacin  750 mg Oral Daily  . mometasone-formoterol  2 puff Inhalation BID  . pantoprazole  20 mg Oral Daily  . pravastatin  20 mg Oral q1800  . predniSONE  40 mg Oral Daily  . senna-docusate  2 tablet Oral QHS  . sodium chloride  3 mL Intravenous Q12H   Continuous Infusions: none  PRN Meds:.acetaminophen **OR** acetaminophen, guaiFENesin-dextromethorphan, ibuprofen, ipratropium-albuterol   Assessment/Plan: 59 yo female with history of diastolic CHF, tobacco abuse and gold stage IV COPD admitted overnight with acute on chronic hypoxic, hypercarbic respiratory failure. Responded well to lasix and bipap.  Acute on chronic hypoxic,hypercarbic resp failure:  Back on home 4L O2. Breathing has improved and pt is on po meds. Net negative 7.159 L output since admission and weight down to 247 lbs from admission weight of 269lbs.  - continue home Lasix po 34m BID - ambulate and check oxygen saturations - Strict  i/o and daily weights  - duonebs q4hr, dulera (in place of advair), Levaquin 750 mg qd, currently day 4. Will stop abx tomorrow for total of 5 days coverage.   - continue po prednisone 1m daily x 1 more day(5 day total steroid) - avoid Benadryl and Ativan as could be source of confusion as well as steroid psychosis - Sputum cx needs to be collected - can transfer out of stepdown if she is not discharged today  Resolved chest pain in patient w/CAD: S/P PCI of LAD with DES (12/2008). Total occlusion of RCA noted at that time, medically managed. Troponin negative x 3.   - ECHO yesterday revealed EF of 50-55% minimally decreased from EF of 55-60% on ECHO in 11/2012 - asa 864m- she will need cardiology follow-up  Constipation: chronic  - Continue senokot-S  DVT Ppx: [place] Lovenox  Diet: heart healthy  Full code including  intubation.  Dispo: Disposition today or tomorrow  The patient does have a current PCP (LKarren CobbleMD) and [facility].  The patient does not have transportation limitations that hinder transportation to clinic appointments.  .Services Needed at time of discharge: Y = Yes, Blank = No PT:   OT:   RN:   Equipment:   Other:     LOS: 4 days   DiJulious OkaMD 05/26/2014, 11:03 AM

## 2014-05-26 NOTE — Progress Notes (Signed)
PT ambulated with nurse on 4LNC and SPo2 remained 86-87%. Assisted back to chair.

## 2014-05-27 DIAGNOSIS — J449 Chronic obstructive pulmonary disease, unspecified: Secondary | ICD-10-CM

## 2014-05-27 LAB — GLUCOSE, CAPILLARY
GLUCOSE-CAPILLARY: 135 mg/dL — AB (ref 70–99)
Glucose-Capillary: 133 mg/dL — ABNORMAL HIGH (ref 70–99)

## 2014-05-27 MED ORDER — ASPIRIN 81 MG PO CHEW: 81.0000 mg | CHEWABLE_TABLET | Freq: Every day | ORAL | Status: DC

## 2014-05-27 MED ORDER — LEVOFLOXACIN 750 MG PO TABS
750.0000 mg | ORAL_TABLET | Freq: Once | ORAL | Status: AC
  Administered 2014-05-27: 750 mg via ORAL
  Filled 2014-05-27: qty 1

## 2014-05-27 MED ORDER — ALBUTEROL SULFATE HFA 108 (90 BASE) MCG/ACT IN AERS: 2.0000 | INHALATION_SPRAY | Freq: Four times a day (QID) | RESPIRATORY_TRACT | Status: DC | PRN

## 2014-05-27 MED ORDER — FUROSEMIDE 20 MG PO TABS: 40.0000 mg | ORAL_TABLET | Freq: Every day | ORAL | Status: DC

## 2014-05-27 NOTE — Progress Notes (Signed)
Subjective: [name] reports feeling well, c/o headache that is not being well controled with acetominophen.  She continues to have difficulty with sleeping, states "its hard to get sleep in this place."  Denies any difficulty with breathing at rest or with walking.  She is on 4L nasal canula.  Tolerated walking with nurse yesterday.  Asked about using BC powder for headache.  Discussed with team.      Objective: Vital signs in last 24 hours: Filed Vitals:   05/27/14 0730 05/27/14 0826 05/27/14 1112 05/27/14 1410  BP: 123/95  100/70   Pulse: 82  88   Temp: 98 F (36.7 C)  97.5 F (36.4 C)   TempSrc: Oral  Oral   Resp: 18  18   Height:      Weight:      SpO2: 92% 92% 92% 90%   Weight change: -0.4 kg (-14.1 oz)  Intake/Output Summary (Last 24 hours) at 05/27/14 1525 Last data filed at 05/27/14 1408  Gross per 24 hour  Intake   1166 ml  Output   1950 ml  Net   -784 ml   General appearance: alert and lying comfortably in bed in no acute distress.   Lungs: Left Lower lobe with crackles, clear to ausculation in all other lung fields Heart: regular rate and rhythm, S1, S2 normal, no murmur, click, rub or gallop Extremities: edema minimal.  Decreased since admission Lab Results: _0 @ Micro Results: Recent Results (from the past 240 hour(s))  Urine culture     Status: None   Collection Time: 05/23/14 12:48 AM  Result Value Ref Range Status   Specimen Description URINE, CATHETERIZED  Final   Special Requests Normal  Final   Colony Count NO GROWTH Performed at [facility]   Final   Culture NO GROWTH Performed at [facility]   Final   Report Status 05/24/2014 FINAL  Final  MRSA PCR Screening     Status: None   Collection Time: 05/23/14  7:10 AM  Result Value Ref Range Status   MRSA by PCR NEGATIVE NEGATIVE Final    Comment:        The GeneXpert MRSA Assay (FDA approved for NASAL specimens only), is one component of a comprehensive MRSA  colonization surveillance program. It is not intended to diagnose MRSA infection nor to guide or monitor treatment for MRSA infections.    Studies/Results: No results found. Medications: I have reviewed the patient's current medications. Scheduled Meds: . antiseptic oral rinse  7 mL Mouth Rinse BID  . aspirin  81 mg Oral Daily  . enoxaparin (LOVENOX) injection  0.5 mg/kg Subcutaneous Q24H  . furosemide  40 mg Oral BID  . insulin aspart  0-9 Units Subcutaneous TID WC  . ipratropium-albuterol  3 mL Nebulization TID  . mometasone-formoterol  2 puff Inhalation BID  . pantoprazole  20 mg Oral Daily  . pravastatin  20 mg Oral q1800  . senna-docusate  2 tablet Oral QHS  . sodium chloride  3 mL Intravenous Q12H   PRN Meds:.acetaminophen **OR** acetaminophen, guaiFENesin-dextromethorphan, ibuprofen, ipratropium-albuterol  Assessment/Plan: [name] is a 59 yo female with history of diastolic CHF, tobacco abuse and gold stage IV COPD admitted with acute on chronic hypoxic, hypercarbic respiratory failure.   Acute on chronic hypoxic,hypercarbic resp failure: From a respiratory standpoint she has improved. She has been weaned to 4.5L nasal cannula (her home oxygen is 4L), she is down 5L and her weight is down 7 pounds (currently  247 pounds, baseline is 250 pounds).  - pt on home Lasix po 15m BID-there was some discussion about how much lasix she is willing to take.  Will encourage compliance and discuss possible changes at f/u visit next week. - ambulate and check oxygen saturations, pt tolerated well.  Returned to her normal baseline - Strict i/o and daily weights - duonebs q4hr, dulera (in place of advair), completed Levaquin 750 mg antibiotic course.   - completed 5 day prednisone course - Sputum culture unable to be obtained during admission due to decreased sputum production.  Pt with no symptoms of infection and completed a full antibiotic course.  No further work up needed. -Pt  with home oxygen of 4 L -Discontinued symbicort, pt is already taking Advair.  Changed inhaler to Provent from ProAir, pt states her insurance will not cover the ProAir therefore she has not been using it.  Pt instructed to call clinic if she is unable to fill new prescription.  Will reassess at next week's follow up.    Tobacco abuse: 1/2 pack daily. Quit 5 days prior to admission due to breathing status. - encouraged continued tobacco cessation.  Will discuss at follow up and discuss other smoking cessation options, if pt is open to it.   - declined nicotine patch during admission.    Resolved chest pain in patient w/CAD: S/P PCI of LAD with DES (12/2008). Total occlusion of RCA noted at that time, medically managed. ASA added this admission. She reported chest pain that has resolved. Serial EKGs yesterday revealed depressed ST segments in V2-V6. Troponin negative x 3. The patient denies pain this AM. 2D Echo showed no abnormalities and is unchanged from prior echo in Aug 2014  - she will need cardiology follow-up  Constipation: chronic  - Pt states the current med regimen is not helping but thinks it will definitely improve once she's able to go home.    DVT Ppx: [place] Lovenox discontinued upon discharge.     Diet: Pt tolerating heart healthy diet well with no nausea or vomiting.   This is a MCareers information officerNote.  The care of the patient was discussed with Dr. JMarinda Elkand the assessment and plan formulated with their assistance.  Please see their attached note for official documentation of the daily encounter.   LOS: 5 days   KClois Dupes Med Student 05/27/2014, 3:25 PM

## 2014-05-27 NOTE — Progress Notes (Signed)
Subjective: Pt alert and oriented. States breathing is improved.   Objective: Vital signs in last 24 hours: Filed Vitals:   05/27/14 0410 05/27/14 0730 05/27/14 0826 05/27/14 1112  BP: 153/82 123/95  100/70  Pulse:  82  88  Temp: 98.5 F (36.9 C) 98 F (36.7 C)  97.5 F (36.4 C)  TempSrc: Oral Oral  Oral  Resp:  18  18  Height:      Weight:      SpO2: 95% 92% 92% 92%   Weight change: -14.1 oz (-0.4 kg)  Intake/Output Summary (Last 24 hours) at 05/27/14 1242 Last data filed at 05/27/14 1100  Gross per 24 hour  Intake    726 ml  Output   2275 ml  Net  -1549 ml   Physical Exam: Appearance:  NAD, nasal cannula in place, calm and cooperative Heart: RRR, normal S1S2, no MRG Lungs: on nasal cannula 4L, in no respiratory distress, no wheezes/rales/rhonchi Abdomen: BS+, soft, nontender, nondistended Extremities: trace pedal edema, rt leg larger than left- chronic Neurologic: A&Ox3, affect appropriate  Lab Results: Basic Metabolic Panel:  Recent Labs Lab 05/23/14 0120  05/25/14 0322 05/26/14 0303  NA 143  < > 139 139  K 3.1*  < > 3.8 4.3  CL 84*  < > 87* 88*  CO2 >50*  < > >50* 48*  GLUCOSE 257*  < > 184* 178*  BUN <5*  < > 22 21  CREATININE 0.80  < > 0.86 0.80  CALCIUM 9.0  < > 8.9 8.6  MG 1.4*  --   --   --   < > = values in this interval not displayed. CBC:  Recent Labs Lab 05/25/14 0322 05/26/14 0303  WBC 10.1 8.5  NEUTROABS 8.8* 7.0  HGB 13.0 13.4  HCT 41.1 42.8  MCV 97.4 98.2  PLT 153 144*   CBG:  Recent Labs Lab 05/25/14 2131 05/26/14 0733 05/26/14 1141 05/26/14 1632 05/26/14 2105 05/27/14 0727  GLUCAP 222* 102* 185* 265* 243* 133*    Medications: I have reviewed the patient's current medications. Scheduled Meds: . antiseptic oral rinse  7 mL Mouth Rinse BID  . aspirin  81 mg Oral Daily  . enoxaparin (LOVENOX) injection  0.5 mg/kg Subcutaneous Q24H  . furosemide  40 mg Oral BID  . insulin aspart  0-9 Units Subcutaneous TID WC  .  ipratropium-albuterol  3 mL Nebulization TID  . levofloxacin  750 mg Oral Once  . mometasone-formoterol  2 puff Inhalation BID  . pantoprazole  20 mg Oral Daily  . pravastatin  20 mg Oral q1800  . senna-docusate  2 tablet Oral QHS  . sodium chloride  3 mL Intravenous Q12H   Continuous Infusions: none  PRN Meds:.acetaminophen **OR** acetaminophen, guaiFENesin-dextromethorphan, ibuprofen, ipratropium-albuterol   Assessment/Plan: 59 yo female with history of diastolic CHF, tobacco abuse and gold stage IV COPD admitted overnight with acute on chronic hypoxic, hypercarbic respiratory failure. Responded well to lasix and bipap.  Acute on chronic hypoxic,hypercarbic resp failure:  Back on home 4L O2. Breathing has improved. Net negative 8.868 L output since admission and weight down to 246 lbs from admission weight of 269lbs.  - continue home Lasix po 28m BID, pt states she was only taking lasix 215mq AM. Non compliance has been an issue for patient.  - duonebs q4hr, dulera (in place of advair), Levaquin 750 mg qd, currently day 5.   Resolved chest pain in patient w/CAD: S/P PCI of LAD with  DES (12/2008). Total occlusion of RCA noted at that time, medically managed. Troponin negative x 3.   - ECHO this admission revealed EF of 50-55% minimally decreased from EF of 55-60% on ECHO in 11/2012 - asa 39m - she will need cardiology follow-up  Constipation: chronic  - Continue senokot-S  DVT Ppx: [place] Lovenox  Diet: heart healthy  Full code including intubation.  Dispo: Disposition today  The patient does have a current PCP (Karren Cobble MD) and [facility].  The patient does not have transportation limitations that hinder transportation to clinic appointments.  .Services Needed at time of discharge: Y = Yes, Blank = No PT:   OT:   RN:   Equipment:   Other:     LOS: 5 days   DJulious Oka MD 05/27/2014, 12:42 PM

## 2014-05-27 NOTE — Discharge Instructions (Signed)
Start taking lasix 39m once a day. Also, we prescribed you a new albuterol inhaler called Proair. Call the clinic if your insurance does not pay for this for another presciption.   Call your PCP or go to the ED if your breathing worsens.

## 2014-05-27 NOTE — Progress Notes (Signed)
Discharged home by wheelchair, discharge instructions given to pt. Belongings taken home

## 2014-05-27 NOTE — Progress Notes (Signed)
SATURATION QUALIFICATIONS: (This note is used to comply with regulatory documentation for home oxygen)  Patient Saturations on 4L at Rest = 89%  Patient Saturations on 6L at rest = 95%  Patient Saturations on 6 Liters of oxygen while Ambulating = 90%  Please briefly explain why patient needs home oxygen:Pt requiring 4L at rest and 6L with acitivity due to desaturation.  [name], [place]

## 2014-05-27 NOTE — Discharge Summary (Signed)
Name: [name] MRN: [id number] DOB: 1956-01-08 60 y.o. PCP: Karren Cobble, MD  Date of Admission: 05/22/2014  6:44 PM Date of Discharge: 05/27/2014 Attending Physician: Axel Filler, MD  Discharge Diagnosis: Acute on chronic respiratory failure with hypercapnia w/ acute on chronic diastolic CHF contributing to SOB.   TOBACCO ABUSE   COPD, severe  Discharge Medications:   Medication List    STOP taking these medications        BC HEADACHE POWDER PO      TAKE these medications        albuterol (2.5 MG/3ML) 0.083% nebulizer solution  Commonly known as:  PROVENTIL  Take 2.5 mg by nebulization daily as needed for wheezing or shortness of breath.     albuterol 108 (90 BASE) MCG/ACT inhaler  Commonly known as:  PROVENTIL HFA;VENTOLIN HFA  Inhale 2 puffs into the lungs 3 (three) times daily as needed for wheezing or shortness of breath.     albuterol 108 (90 BASE) MCG/ACT inhaler  Commonly known as:  PROVENTIL HFA;VENTOLIN HFA  Inhale 2 puffs into the lungs every 6 (six) hours as needed for wheezing or shortness of breath.     aspirin 81 MG chewable tablet  Chew 1 tablet (81 mg total) by mouth daily.     diclofenac sodium 1 % Gel  Commonly known as:  VOLTAREN  Apply 2 g topically 4 (four) times daily.     Fluticasone-Salmeterol 250-50 MCG/DOSE Aepb  Commonly known as:  ADVAIR  Inhale 1 puff into the lungs 2 (two) times daily.     furosemide 20 MG tablet  Commonly known as:  LASIX  Take 2 tablets (40 mg total) by mouth daily.     lovastatin 20 MG tablet  Commonly known as:  MEVACOR  Take 1 tablet (20 mg total) by mouth at bedtime.     polyethylene glycol packet  Commonly known as:  MIRALAX / GLYCOLAX  Take 17 g by mouth daily.     senna-docusate 8.6-50 MG per tablet  Commonly known as:  Senokot-S  Take 2 tablets by mouth at bedtime.     SYMBICORT 160-4.5 MCG/ACT inhaler  Generic drug:  budesonide-formoterol  Inhale 1 puff into the lungs daily.       tiotropium 18 MCG inhalation capsule  Commonly known as:  SPIRIVA HANDIHALER  Place 1 capsule (18 mcg total) into inhaler and inhale daily.        Disposition and follow-up:   [name] was discharged from [facility] in Stable condition.  At the hospital follow up visit please address:  1.  Please help set patient up with cardiology follow up now that she has medicare. Also please ensure she was able to fill albuterol inhaler. Her lasix was decreased from 35m BID to daily to avoid contraction alkalosis. Please reassess and adjust does as needed.   2.  Labs / imaging needed at time of follow-up: BMET  3.  Pending labs/ test needing follow-up: none  Follow-up Appointments: Follow-up Information    Follow up with [facility] 06[facility]/2014.   Why:  appointment at 10:15am   Contact information:   [id number]     Follow up with PAsencion Noble MD On 06/16/2014.   Specialty:  Pulmonary Disease   Why:  at 10:00am   Contact information:   [address] [phone]  Procedures Performed:  Dg Chest Port 1 View  05/22/2014   CLINICAL DATA:  Intermittent shortness of breath since 05/18/2014.  EXAM: PORTABLE CHEST - 1 VIEW  COMPARISON:  PA and lateral chest 04/10/2014 and 03/18/2014.  FINDINGS: Cardiomegaly and vascular congestion appear unchanged. No consolidative process, pneumothorax or effusion is identified. No focal bony abnormality is noted.  IMPRESSION: No acute abnormality.  Cardiomegaly and chronic vascular congestion.   Electronically Signed   By: Inge Rise M.D.   On: 05/22/2014 20:02    2D Echo: 05/25/14 Study Conclusions  - Left ventricle: The cavity size was normal. Wall thickness was increased in a pattern of mild LVH. Systolic function was normal. The estimated ejection fraction was in the range of 50% to 55%. Regional wall motion  abnormalities cannot be excluded. Doppler parameters are consistent with abnormal left ventricular relaxation (grade 1 diastolic dysfunction). - Aortic valve: Valve area (Vmax): 2.59 cm^2. - Left atrium: The atrium was mildly dilated. - Right atrium: The atrium was mildly dilated. - Pericardium, extracardiac: A trivial pericardial effusion was identified posterior to the heart.   Admission HPI: 59 yo female with CHF, COPD gold stage IV, pulm HTN, CAD, tobacco abuse, multiple admission in the past with A o C CHF exacerbation last 1/7-1/11/16, on home oxygen 4L, here with SOB, found to be hypercarbic and hypoxic. Having SOB for last several days, has been using her nebs (having problem getting albuterol through insurance), hasn't been taking lasix. SOB is associated with cough and increased sputum. Has possible sick contact who has been sick in the hospital.   Her weight is also up from her baseline weight. Has leg swelling. Sat 73% on 4L, 76% on 6L. ABG obtained. Placed on bipap. Started on cont nebs, got 24m IV lasix, solumedrol.  BNP elevated 536, last 171.9. Last echo EF 511-94% diastolic dysfunction, mod increased pulm pressure 64.   Hospital Course by problem list:   Acute on chronic respiratory failure with hypercapnia w/ acute on chronic diastolic CHF contributing to SOB.   TOBACCO ABUSE   COPD, severe    [name] a 59yo female with history of diastolic CHF, tobacco abuse and gold stage IV COPD admitted with acute on chronic hypoxic, hypercarbic respiratory failure. Pt became stable and was ambulating and tolerating a regular diet.   #Acute on chronic hypoxic,hypercarbic resp failure: On admission patient was placed on IV Lasix, duonebs q4hr, dulera (in place of advair), Levaquin 750 mg daily IV for 3 days, and IV solumedrol. Prior to discharge, the IV Levaquin and solumedrol was transitioned to PO for an additional 2 days, for 5 days total. Her IV Lasix was transitioned to  her home med of 432mBID. She was d/c'd home on lasix 4019maily to avoid contraction alkalosis. Pt was given Bipap and a partial non-rebreather prn on admission and was progressed to home 4L nasal cannula. Her weight decreased 8 lbs from admission, initial weight was 254. Her baseline weight is around 250 lbs, she is currently 246 lbs. at discharge. Net input/output was -8.868L. Pt ambulated with physical therapy and O2 sats on 4L was 89% at rest, and on ambulation with 6L O2 pt satted at 90%. Pt adjusts her home O2 up to 5L as needed such as when the weather gets cold.   #Delirium :On hospital day # 3, pt became confused and reported visual hallucinations. She also became agitated, demanding to go home. Treatment involved discontinuing Ativan and Benadryl, reorientation through family  interaction, calm environment and rest. Pt was not transferred to floor as initially plan to help prevent further deterioration. Symptoms resolved and pt returned to baseline. The likely cause may have been high dose steroids exacerbated by Ativan and Benadryl.   #Tobacco abuse: Pt smokes 1/2 pack daily. Quit 5 days prior to admission due to breathing status. Pt declined nicotine patch and was encouraged to continue tobacco cessation.   #Resolved chest pain in patient w/CAD: S/P PCI of LAD with DES (12/2008). Total occlusion of RCA noted at that time, medically managed. She reported chest pain on the morning of HD#3. On physical exam she reported symptoms in R rib cage that extended up to her sternum. Tender to palpation over sternum. A full cardiac workup included: serial EKGs, serial troponin, and 2D echocardiogram. Pt was on continuous telemetry which showed no cardiac alarms but revealed only respiratory alarms. Troponins were negative x3 with an EKG showing depressed ST segments in V2-V6. ASA added this admission. Recommend cardiology follow up.    Discharge Vitals:   BP 100/70 mmHg  Pulse 88  Temp(Src) 97.5 F (36.4  C) (Oral)  Resp 18  Ht _0  (1.651 m)  Wt 246 lb 11.1 oz (111.9 kg)  BMI 41.05 kg/m2  SpO2 92%  Discharge Labs:  Results for orders placed or performed during the hospital encounter of 05/22/14 (from the past 24 hour(s))  Glucose, capillary     Status: Abnormal   Collection Time: 05/26/14  4:32 PM  Result Value Ref Range   Glucose-Capillary 265 (H) 70 - 99 mg/dL   Comment 1 Notify RN   Glucose, capillary     Status: Abnormal   Collection Time: 05/26/14  9:05 PM  Result Value Ref Range   Glucose-Capillary 243 (H) 70 - 99 mg/dL   Comment 1 Capillary Specimen   Glucose, capillary     Status: Abnormal   Collection Time: 05/27/14  7:27 AM  Result Value Ref Range   Glucose-Capillary 133 (H) 70 - 99 mg/dL   Comment 1 Notify RN   Glucose, capillary     Status: Abnormal   Collection Time: 05/27/14 11:11 AM  Result Value Ref Range   Glucose-Capillary 135 (H) 70 - 99 mg/dL   Comment 1 Notify RN     Signed: Julious Oka, MD 05/27/2014, 1:51 PM    Services Ordered on Discharge: home health PT Equipment Ordered on Discharge: none

## 2014-05-27 NOTE — Progress Notes (Signed)
Pt refused lasix states "I don't want to be up all night on the bedside commode." Pt requested for med to be postponed until 4 am. Will give med then.

## 2014-05-27 NOTE — Evaluation (Signed)
Physical Therapy Evaluation Patient Details Name: [name] MRN: [id number] DOB: 07-12-1955 Today's Date: 05/27/2014   History of Present Illness  59 yo female with history of diastolic CHF, tobacco abuse and gold stage IV COPD admitted 2/18 with acute on chronic hypoxic, hypercarbic respiratory failure  Clinical Impression  Ms.Mcginnis states she has had progressive decline in function over the last 4 years and even more significantly over the last 8mowhere she will get out at times for grocery shopping but mainly stays in the home. Pt with sats 89-90% on arrival at rest on 4L when bumped to 6L sats 95% and maintained 90% on 6L with gait. Pt reported not being able to lie flat due to spinning. Performed DMarye Roundright with positive rotational nystagmus 6 sec, hegative on the left and treated with Epley maneuver. Pt states the spinning has been ongoing for years and never treated but positional along with periods of falling for no apparent reason which from pt description those instances sound more cardiovascular in origin. Pt educated for energy conservation, tripod breathing, benefit of RW for breathing and fall reduction. Pt with all above and below deficits who will benefit from continued therapy to maximize mobility, function, and independence to decrease fall risk.    Follow Up Recommendations Home health PT    Equipment Recommendations  None recommended by PT    Recommendations for Other Services       Precautions / Restrictions Precautions Precautions: Fall      Mobility  Bed Mobility Overal bed mobility: Modified Independent                Transfers Overall transfer level: Modified independent                  Ambulation/Gait Ambulation/Gait assistance: Supervision Ambulation Distance (Feet): 100 Feet Assistive device: Rolling walker (2 wheeled) Gait Pattern/deviations: Step-through pattern;Decreased stride length   Gait velocity interpretation: Below  normal speed for age/gender General Gait Details: cues for posture, breathing technique and activity tolerance  Stairs            Wheelchair Mobility    Modified Rankin (Stroke Patients Only)       Balance Overall balance assessment: History of Falls                                           Pertinent Vitals/Pain Pain Assessment: No/denies pain    Home Living Family/patient expects to be discharged to:: Private residence Living Arrangements: Non-relatives/Friends;Other relatives Available Help at Discharge: Family;Available 24 hours/day Type of Home: House Home Access: Stairs to enter Entrance Stairs-Rails: None Entrance Stairs-Number of Steps: 1 Home Layout: One level Home Equipment: Tub bench;Wheelchair - m[employer]- 2 wheels;Cane - single point      Prior Function Level of Independence: Independent         Comments: pt states she uses shower chair     Hand Dominance        Extremity/Trunk Assessment   Upper Extremity Assessment: Overall WFL for tasks assessed           Lower Extremity Assessment: Generalized weakness      Cervical / Trunk Assessment: Normal  Communication   Communication: No difficulties  Cognition Arousal/Alertness: Awake/alert Behavior During Therapy: WFL for tasks assessed/performed Overall Cognitive Status: Within Functional Limits for tasks assessed  General Comments      Exercises        Assessment/Plan    PT Assessment Patient needs continued PT services  PT Diagnosis Difficulty walking;Generalized weakness   PT Problem List Decreased strength;Decreased activity tolerance;Decreased balance;Decreased knowledge of use of DME;Decreased mobility;Cardiopulmonary status limiting activity  PT Treatment Interventions Gait training;DME instruction;Functional mobility training;Patient/family education;Therapeutic exercise;Therapeutic activities;Other (comment)  (canalith repositioning)   PT Goals (Current goals can be found in the Care Plan section) Acute Rehab PT Goals Patient Stated Goal: go home PT Goal Formulation: With patient Time For Goal Achievement: 06/03/14 Potential to Achieve Goals: Good    Frequency Min 3X/week   Barriers to discharge Decreased caregiver support      Co-evaluation               End of Session   Activity Tolerance: Patient tolerated treatment well Patient left: in bed;with call bell/phone within reach Nurse Communication: Precautions;Mobility status         Time: 1326-1405 PT Time Calculation (min) (ACUTE ONLY): 39 min   Charges:   PT Evaluation $Initial PT Evaluation Tier I: 1 Procedure PT Treatments $Therapeutic Activity: 8-22 mins $Canalith Rep Proc: 8-22 mins   PT G Codes:        Melford Aase 05/27/2014, 2:19 PM Elwyn Reach, [place]

## 2014-05-27 NOTE — Progress Notes (Signed)
MD made aware about the o2 saturation done during ambulation as reported  by PT. No order given.

## 2014-05-27 NOTE — Progress Notes (Signed)
Refused home health RN/PT.

## 2014-05-27 NOTE — Progress Notes (Signed)
Internal Medicine Attending  Date: 05/27/2014  Patient name: [name] Medical record number: [id number] Date of birth: 01/26/1956 Age: 59 y.o. Gender: female  I saw and evaluated the patient, and discussed her care with resident on A.M rounds.  I reviewed the resident's note by Dr. Hulen Luster and I agree with the resident's findings and plans as documented in her note, with the following additional comments.  Patient reports that she is doing well on her home regimen of 4 L/m nasal cannula oxygen; lung exam is clear.  She feels that she is at baseline and ready to go home.  I agree with plan to discharge home today with outpatient follow-up next week.  She has chronic hypercarbic respiratory failure with renal compensation, and would  likely benefit from outpatient pulmonology referral.  We discussed discharge plans and follow-up plans with patient at bedside.

## 2014-05-29 ENCOUNTER — Other Ambulatory Visit: Payer: Self-pay | Admitting: *Deleted

## 2014-05-29 DIAGNOSIS — E785 Hyperlipidemia, unspecified: Secondary | ICD-10-CM

## 2014-05-29 DIAGNOSIS — J449 Chronic obstructive pulmonary disease, unspecified: Secondary | ICD-10-CM

## 2014-05-29 MED ORDER — LOVASTATIN 20 MG PO TABS: 20.0000 mg | ORAL_TABLET | Freq: Every day | ORAL | Status: DC

## 2014-05-29 MED ORDER — TIOTROPIUM BROMIDE MONOHYDRATE 18 MCG IN CAPS: 18.0000 ug | ORAL_CAPSULE | Freq: Every day | RESPIRATORY_TRACT | Status: DC

## 2014-05-29 MED ORDER — FUROSEMIDE 40 MG PO TABS: 40.0000 mg | ORAL_TABLET | Freq: Every day | ORAL | Status: DC

## 2014-05-30 IMAGING — CR DG FOOT COMPLETE 3+V*L*
3 series · 3 of 3 positions shown · non-contrast
Comparison: None.

CLINICAL DATA: Fall

LEFT FOOT - COMPLETE 3+ VIEW

[x foot ap left]
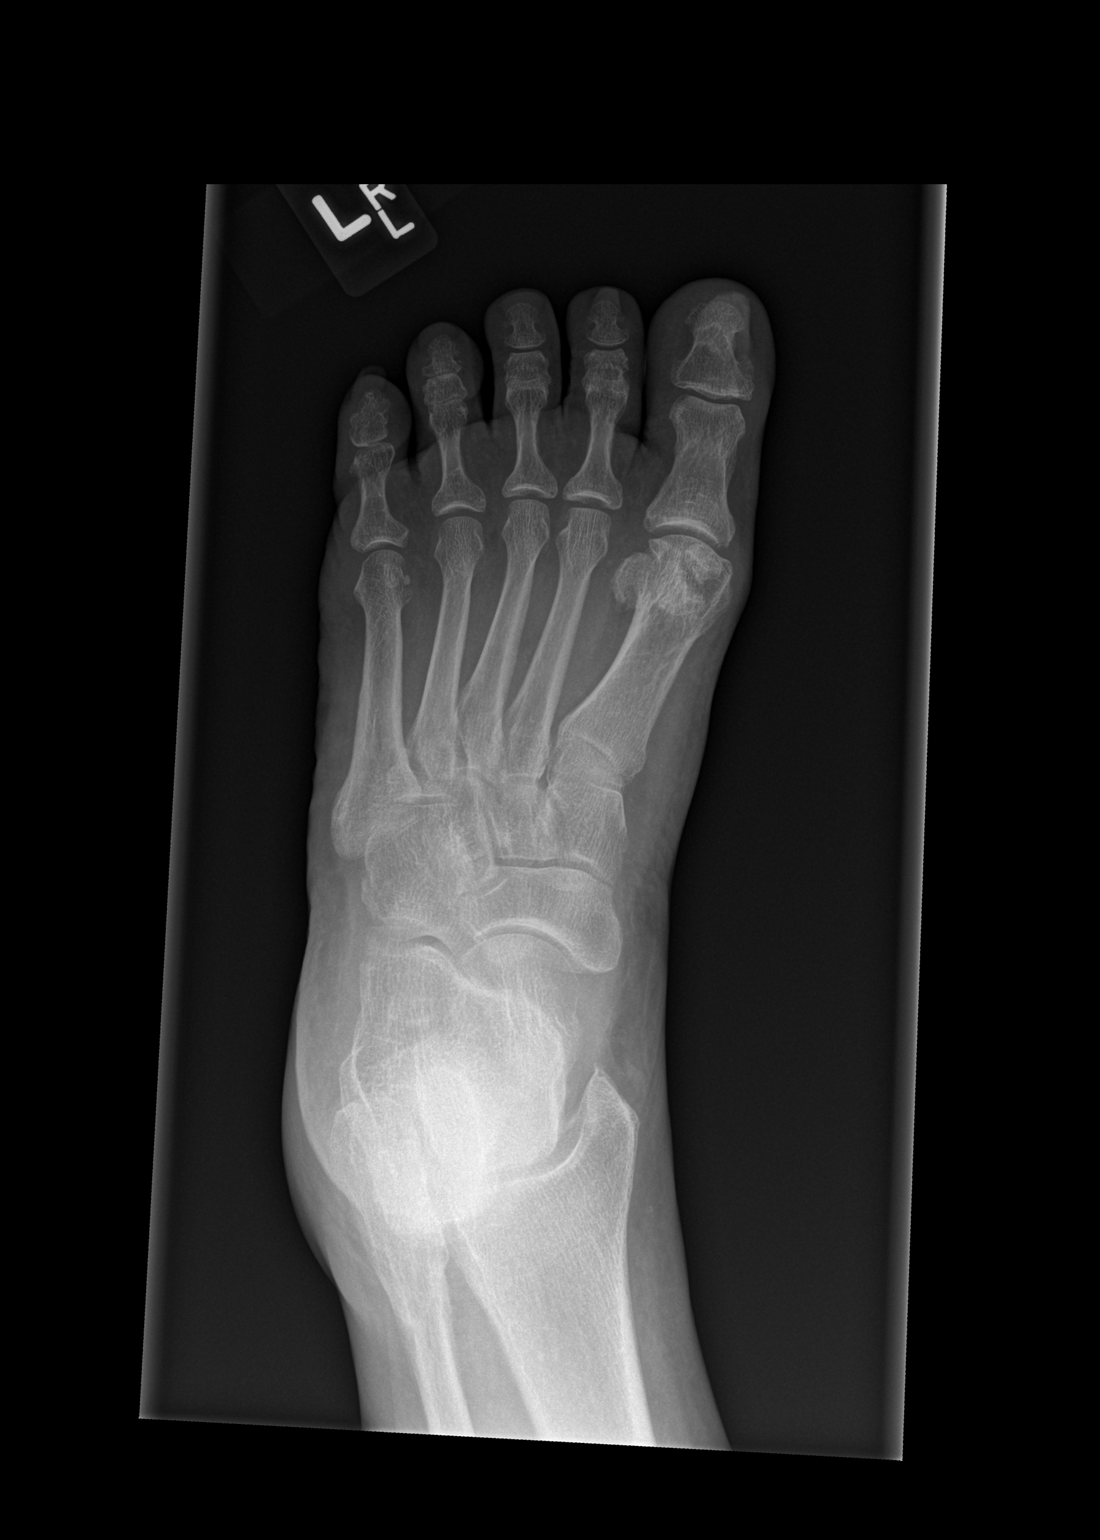

[x foot obl left]
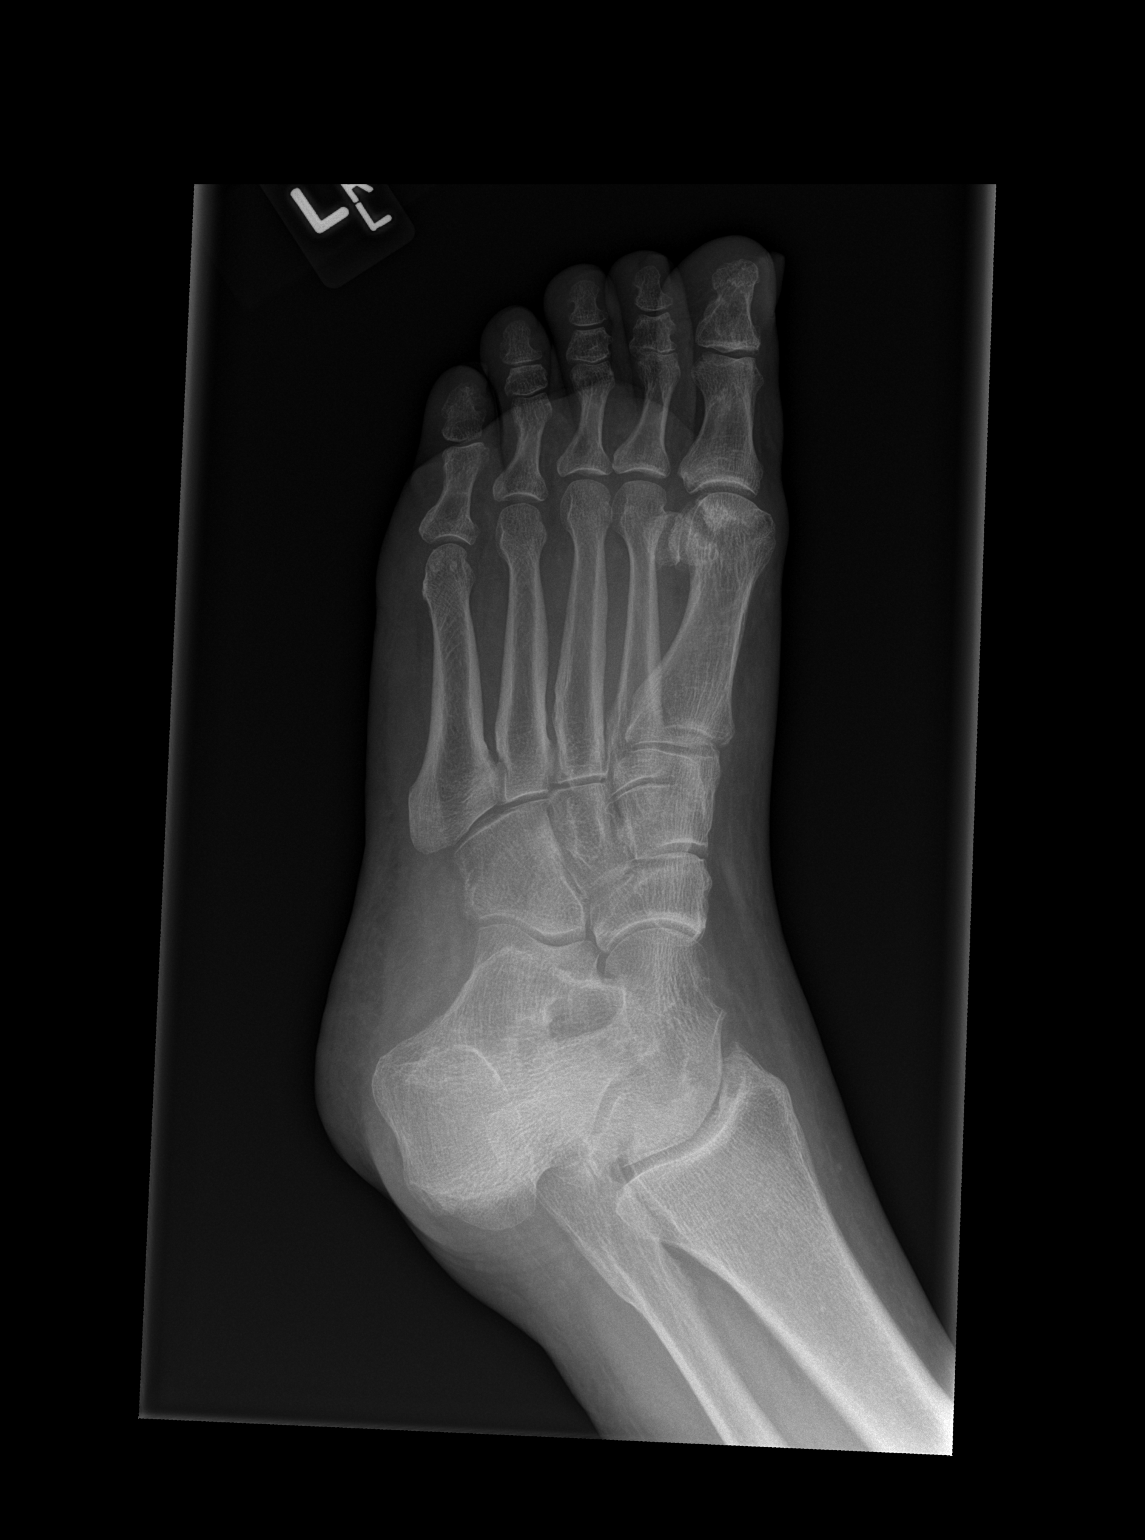

[x foot lat left]
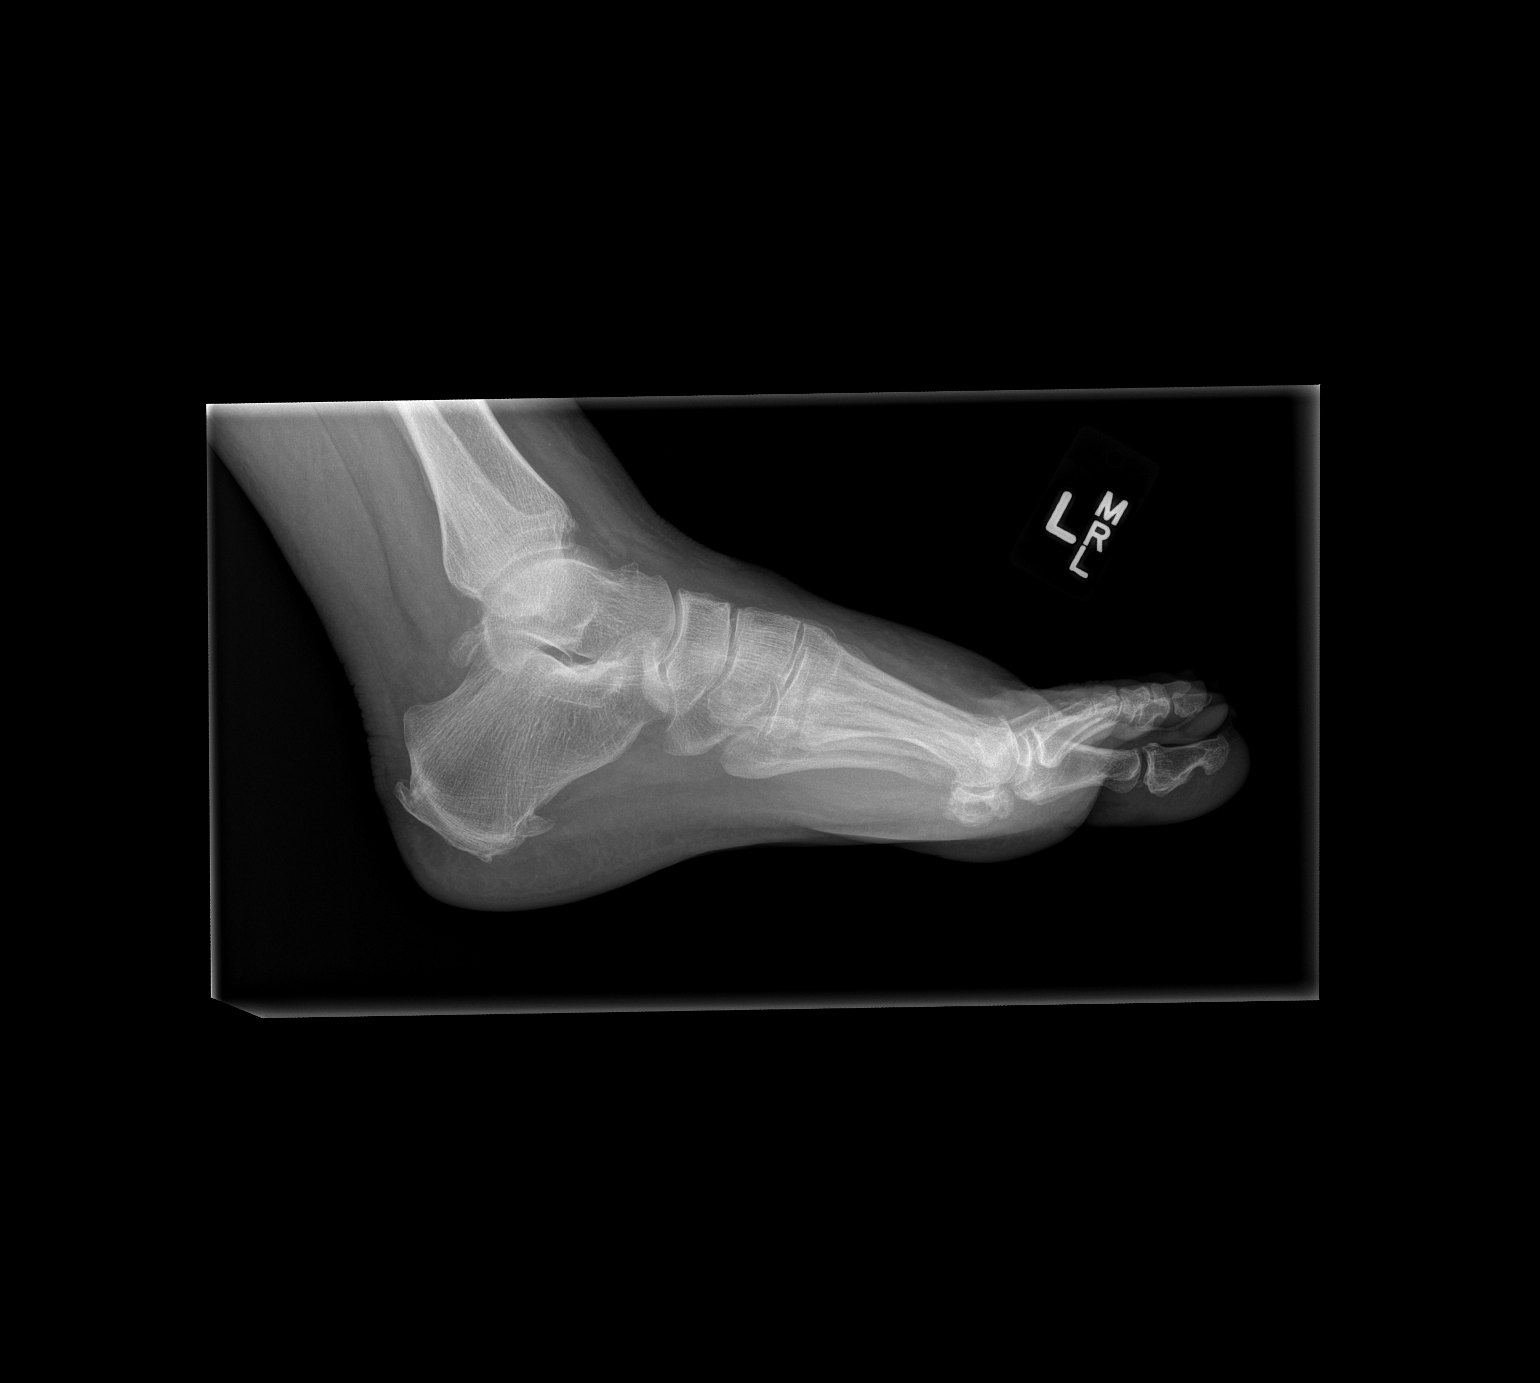

[3 of 3 positions shown; findings below may reference images not displayed]

FINDINGS: Negative for fracture.  Spurring of the calcaneus is
present.
IMPRESSION: Negative for fracture.

## 2014-06-02 ENCOUNTER — Encounter: Payer: Self-pay | Admitting: Internal Medicine

## 2014-06-02 ENCOUNTER — Ambulatory Visit: Payer: Medicare HMO | Admitting: Internal Medicine

## 2014-06-05 DIAGNOSIS — R0902 Hypoxemia: Secondary | ICD-10-CM | POA: Diagnosis not present

## 2014-06-05 DIAGNOSIS — J449 Chronic obstructive pulmonary disease, unspecified: Secondary | ICD-10-CM | POA: Diagnosis not present

## 2014-06-05 DIAGNOSIS — J9600 Acute respiratory failure, unspecified whether with hypoxia or hypercapnia: Secondary | ICD-10-CM | POA: Diagnosis not present

## 2014-06-16 ENCOUNTER — Inpatient Hospital Stay: Payer: Medicare HMO | Admitting: Critical Care Medicine

## 2014-06-29 ENCOUNTER — Emergency Department (HOSPITAL_COMMUNITY): Payer: Commercial Managed Care - HMO

## 2014-06-29 ENCOUNTER — Other Ambulatory Visit (HOSPITAL_COMMUNITY): Payer: Self-pay

## 2014-06-29 ENCOUNTER — Observation Stay (HOSPITAL_COMMUNITY)
Admission: EM | Admit: 2014-06-29 | Discharge: 2014-07-01 | Disposition: A | Discharge status: Home / Self Care | Payer: Commercial Managed Care - HMO | Attending: Oncology | Admitting: Oncology

## 2014-06-29 ENCOUNTER — Encounter (HOSPITAL_COMMUNITY): Payer: Self-pay | Admitting: Nurse Practitioner

## 2014-06-29 DIAGNOSIS — E669 Obesity, unspecified: Secondary | ICD-10-CM | POA: Insufficient documentation

## 2014-06-29 DIAGNOSIS — R079 Chest pain, unspecified: Secondary | ICD-10-CM | POA: Diagnosis not present

## 2014-06-29 DIAGNOSIS — Z79899 Other long term (current) drug therapy: Secondary | ICD-10-CM | POA: Insufficient documentation

## 2014-06-29 DIAGNOSIS — K219 Gastro-esophageal reflux disease without esophagitis: Secondary | ICD-10-CM | POA: Insufficient documentation

## 2014-06-29 DIAGNOSIS — Z888 Allergy status to other drugs, medicaments and biological substances status: Secondary | ICD-10-CM | POA: Diagnosis not present

## 2014-06-29 DIAGNOSIS — M25559 Pain in unspecified hip: Secondary | ICD-10-CM | POA: Insufficient documentation

## 2014-06-29 DIAGNOSIS — I200 Unstable angina: Secondary | ICD-10-CM

## 2014-06-29 DIAGNOSIS — I272 Other secondary pulmonary hypertension: Secondary | ICD-10-CM | POA: Insufficient documentation

## 2014-06-29 DIAGNOSIS — R0602 Shortness of breath: Secondary | ICD-10-CM | POA: Diagnosis not present

## 2014-06-29 DIAGNOSIS — Z955 Presence of coronary angioplasty implant and graft: Secondary | ICD-10-CM | POA: Diagnosis not present

## 2014-06-29 DIAGNOSIS — R52 Pain, unspecified: Secondary | ICD-10-CM

## 2014-06-29 DIAGNOSIS — J449 Chronic obstructive pulmonary disease, unspecified: Secondary | ICD-10-CM | POA: Diagnosis present

## 2014-06-29 DIAGNOSIS — Z9981 Dependence on supplemental oxygen: Secondary | ICD-10-CM | POA: Insufficient documentation

## 2014-06-29 DIAGNOSIS — I5021 Acute systolic (congestive) heart failure: Secondary | ICD-10-CM | POA: Diagnosis not present

## 2014-06-29 DIAGNOSIS — F17210 Nicotine dependence, cigarettes, uncomplicated: Secondary | ICD-10-CM | POA: Insufficient documentation

## 2014-06-29 DIAGNOSIS — I2510 Atherosclerotic heart disease of native coronary artery without angina pectoris: Secondary | ICD-10-CM | POA: Diagnosis not present

## 2014-06-29 DIAGNOSIS — I5033 Acute on chronic diastolic (congestive) heart failure: Secondary | ICD-10-CM | POA: Diagnosis present

## 2014-06-29 DIAGNOSIS — E785 Hyperlipidemia, unspecified: Secondary | ICD-10-CM | POA: Diagnosis not present

## 2014-06-29 DIAGNOSIS — Z720 Tobacco use: Secondary | ICD-10-CM | POA: Diagnosis present

## 2014-06-29 LAB — BASIC METABOLIC PANEL
Anion gap: 9 (ref 5–15)
BUN: 8 mg/dL (ref 6–23)
CALCIUM: 9 mg/dL (ref 8.4–10.5)
CHLORIDE: 91 mmol/L — AB (ref 96–112)
CO2: 39 mmol/L — AB (ref 19–32)
Creatinine, Ser: 0.76 mg/dL (ref 0.50–1.10)
GFR calc Af Amer: >90 mL/min (ref >90)
GFR, EST NON AFRICAN AMERICAN: 90 mL/min — AB (ref >90)
GLUCOSE: 102 mg/dL — AB (ref 70–99)
Potassium: 4 mmol/L (ref 3.5–5.1)
Sodium: 139 mmol/L (ref 135–145)

## 2014-06-29 LAB — BRAIN NATRIURETIC PEPTIDE: B Natriuretic Peptide: 195.1 pg/mL — ABNORMAL HIGH (ref 0.0–100.0)

## 2014-06-29 LAB — CBC
HCT: 43.1 % (ref 36.0–46.0)
Hemoglobin: 13.4 g/dL (ref 12.0–15.0)
MCH: 31 pg (ref 26.0–34.0)
MCHC: 31.1 g/dL (ref 30.0–36.0)
MCV: 99.8 fL (ref 78.0–100.0)
Platelets: 159 10*3/uL (ref 150–400)
RBC: 4.32 MIL/uL (ref 3.87–5.11)
RDW: 13.9 % (ref 11.5–15.5)
WBC: 7.8 10*3/uL (ref 4.0–10.5)

## 2014-06-29 LAB — I-STAT TROPONIN, ED: TROPONIN I, POC: 0.01 ng/mL (ref 0.00–0.08)

## 2014-06-29 MED ORDER — MORPHINE SULFATE 4 MG/ML IJ SOLN
4.0000 mg | Freq: Once | INTRAMUSCULAR | Status: AC
  Administered 2014-06-29: 4 mg via INTRAVENOUS
  Filled 2014-06-29: qty 1

## 2014-06-29 MED ORDER — ASPIRIN 81 MG PO CHEW
81.0000 mg | CHEWABLE_TABLET | Freq: Every day | ORAL | Status: DC
  Administered 2014-06-30 – 2014-07-01 (×2): 81 mg via ORAL
  Filled 2014-06-29 (×2): qty 1

## 2014-06-29 MED ORDER — BUDESONIDE-FORMOTEROL FUMARATE 160-4.5 MCG/ACT IN AERO: 1.0000 | INHALATION_SPRAY | Freq: Every day | RESPIRATORY_TRACT | Status: DC

## 2014-06-29 MED ORDER — ASPIRIN 81 MG PO CHEW
162.0000 mg | CHEWABLE_TABLET | Freq: Once | ORAL | Status: AC
  Administered 2014-06-29: 162 mg via ORAL
  Filled 2014-06-29: qty 2

## 2014-06-29 MED ORDER — MORPHINE SULFATE 2 MG/ML IJ SOLN: 1.0000 mg | INTRAMUSCULAR | Status: DC | PRN

## 2014-06-29 MED ORDER — ONDANSETRON HCL 4 MG/2ML IJ SOLN: 4.0000 mg | Freq: Four times a day (QID) | INTRAMUSCULAR | Status: DC | PRN

## 2014-06-29 MED ORDER — FUROSEMIDE 10 MG/ML IJ SOLN
40.0000 mg | Freq: Once | INTRAMUSCULAR | Status: AC
  Administered 2014-06-29: 40 mg via INTRAVENOUS
  Filled 2014-06-29: qty 4

## 2014-06-29 MED ORDER — MOMETASONE FURO-FORMOTEROL FUM 100-5 MCG/ACT IN AERO
2.0000 | INHALATION_SPRAY | Freq: Two times a day (BID) | RESPIRATORY_TRACT | Status: DC
  Administered 2014-06-30 (×2): 2 via RESPIRATORY_TRACT
  Filled 2014-06-29: qty 8.8

## 2014-06-29 MED ORDER — TIOTROPIUM BROMIDE MONOHYDRATE 18 MCG IN CAPS
18.0000 ug | ORAL_CAPSULE | Freq: Every day | RESPIRATORY_TRACT | Status: DC
  Administered 2014-06-30: 18 ug via RESPIRATORY_TRACT
  Filled 2014-06-29: qty 5

## 2014-06-29 MED ORDER — HEPARIN (PORCINE) IN NACL 100-0.45 UNIT/ML-% IJ SOLN
1100.0000 [IU]/h | INTRAMUSCULAR | Status: DC
  Administered 2014-06-29: 1100 [IU]/h via INTRAVENOUS
  Filled 2014-06-29: qty 250

## 2014-06-29 MED ORDER — SENNOSIDES-DOCUSATE SODIUM 8.6-50 MG PO TABS
2.0000 | ORAL_TABLET | Freq: Every day | ORAL | Status: DC
  Administered 2014-06-30: 2 via ORAL
  Filled 2014-06-29 (×3): qty 2

## 2014-06-29 MED ORDER — POLYETHYLENE GLYCOL 3350 17 G PO PACK
17.0000 g | PACK | Freq: Every day | ORAL | Status: DC
  Filled 2014-06-29 (×2): qty 1

## 2014-06-29 MED ORDER — ALBUTEROL SULFATE (2.5 MG/3ML) 0.083% IN NEBU: 2.5000 mg | INHALATION_SOLUTION | Freq: Every day | RESPIRATORY_TRACT | Status: DC | PRN

## 2014-06-29 MED ORDER — NITROGLYCERIN 0.4 MG SL SUBL
0.4000 mg | SUBLINGUAL_TABLET | SUBLINGUAL | Status: DC | PRN
  Administered 2014-06-29 (×2): 0.4 mg via SUBLINGUAL
  Filled 2014-06-29: qty 1

## 2014-06-29 MED ORDER — HEPARIN BOLUS VIA INFUSION
4000.0000 [IU] | Freq: Once | INTRAVENOUS | Status: AC
Start: 2014-06-29 — End: 2014-06-29
  Administered 2014-06-29: 4000 [IU] via INTRAVENOUS
  Filled 2014-06-29: qty 4000

## 2014-06-29 MED ORDER — FUROSEMIDE 40 MG PO TABS
40.0000 mg | ORAL_TABLET | Freq: Every day | ORAL | Status: DC
  Administered 2014-06-30 – 2014-07-01 (×2): 40 mg via ORAL
  Filled 2014-06-29 (×2): qty 1

## 2014-06-29 MED ORDER — ALBUTEROL SULFATE (2.5 MG/3ML) 0.083% IN NEBU
3.0000 mL | INHALATION_SOLUTION | Freq: Four times a day (QID) | RESPIRATORY_TRACT | Status: DC | PRN
  Administered 2014-07-01: 3 mL via RESPIRATORY_TRACT
  Filled 2014-06-29: qty 3

## 2014-06-29 MED ORDER — ALBUTEROL SULFATE HFA 108 (90 BASE) MCG/ACT IN AERS: 2.0000 | INHALATION_SPRAY | Freq: Three times a day (TID) | RESPIRATORY_TRACT | Status: DC | PRN

## 2014-06-29 MED ORDER — PRAVASTATIN SODIUM 40 MG PO TABS
40.0000 mg | ORAL_TABLET | Freq: Every day | ORAL | Status: DC
  Administered 2014-06-30 – 2014-07-01 (×2): 40 mg via ORAL
  Filled 2014-06-29 (×2): qty 1

## 2014-06-29 MED ORDER — ACETAMINOPHEN 325 MG PO TABS: 650.0000 mg | ORAL_TABLET | ORAL | Status: DC | PRN

## 2014-06-29 NOTE — Progress Notes (Addendum)
ANTICOAGULATION CONSULT NOTE - Initial Consult  Pharmacy Consult for Lovenox  (changing from IV heparin to Lovenox SQ) Indication: ACS / NSTEMI / CP  Allergies  Allergen Reactions  . Fluconazole Anaphylaxis, Itching and Swelling    Patient Measurements: Height: _0  (165.1 cm) Weight: 253 lb 15.5 oz (115.2 kg) IBW/kg (Calculated) : 57   Vital Signs: Temp: 98.9 F (37.2 C) (03/27 2333) Temp Source: Oral (03/27 2333) BP: 120/75 mmHg (03/27 2333) Pulse Rate: 86 (03/27 2333)  Labs:  Recent Labs  06/29/14 1855  HGB 13.4  HCT 43.1  PLT 159  CREATININE 0.76    Estimated Creatinine Clearance: 94.8 mL/min (by C-G formula based on Cr of 0.76).   Medications:  Scheduled:  . [START ON 06/30/2014] aspirin  81 mg Oral Daily  . [START ON 06/30/2014] furosemide  40 mg Oral Daily  . mometasone-formoterol  2 puff Inhalation BID  . [START ON 06/30/2014] polyethylene glycol  17 g Oral Daily  . [START ON 06/30/2014] pravastatin  40 mg Oral q1800  . senna-docusate  2 tablet Oral QHS  . [START ON 06/30/2014] tiotropium  18 mcg Inhalation Daily    Assessment: 59 y.o female was started on IV heparin drip at 21: 55.  Now MD has discontinued the heparin and changing to lovenox.   CBC within normal.  4000 units bolus of heparin was given at 21:55.  RN reports that heparin drip was discontinued at 23:20. Patient weighs 115 kg. SCr 0.76, CrCl ~ 95 ml/min   Goal of Therapy:  Anti-Xa level 0.6-1 units/ml 4hrs after LMWH dose given Monitor platelets by anticoagulation protocol: Yes   Plan:  Change from Heparin to  Lovenox 115 mg SQ q12h.  Okay to start lovenox now.  CBC q72 hours   Nicole Cella, RPh Clinical Pharmacist Pager: [phone] 06/29/2014,11:45 PM

## 2014-06-29 NOTE — Progress Notes (Signed)
ANTICOAGULATION CONSULT NOTE - Initial Consult  Pharmacy Consult for heparin Indication: chest pain/ACS  Allergies  Allergen Reactions  . Fluconazole Anaphylaxis, Itching and Swelling    Patient Measurements:  IBW: 57 Heparin Dosing Weight: 84 kg  Vital Signs: Temp: 98.8 F (37.1 C) (03/27 1816) Temp Source: Oral (03/27 1816) BP: 111/67 mmHg (03/27 2015) Pulse Rate: 83 (03/27 2015)  Labs:  Recent Labs  06/29/14 1855  HGB 13.4  HCT 43.1  PLT 159  CREATININE 0.76    CrCl cannot be calculated (Unknown ideal weight.).   Medical History: Past Medical History  Diagnosis Date  . Coronary artery disease     S/P PCI of LAD with DES (12/2008). Total occlusion of RCA noted at that time., medically managed. ACS ruled out 03/2009 with Lexiscan myoview . Followed by [place].  . Pulmonary hypertension     2-D Echo (56/9794) - Systolic pressure was moderately increased. PA peak pressure  43mHg. 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 Followed by Dr. WJoya Gaskins(Velora Heckler  . Diastolic dysfunction     2-D Echo (12/2008) - Normal LV Systolic funciton with EF 60-65%. Grade 1 diastolid dysfunction. No regional wall motion abnormalities. Moderate pulmonary HTN with PA peak pressure 562mg.  . Marland KitchenOPD (chronic obstructive pulmonary disease)     Severe. Gold Stage IV.  PFTs (12/2008) - severe obstructive airway disease. Active tobacco use. Requires 4L O2 at home.  . Pulmonary nodule, right     Small right middle lobe nodule. Stable as of 12/2008.  . Marland Kitchenrediabetes 12/2008    HgbA1c 6.4 (12/2008)  . Hx MRSA infection     Recurrent MRSA thigh abscesses.  . Tobacco abuse     Ongoing.  . Obesity   . Hyperlipidemia   . GERD (gastroesophageal reflux disease)     S/P Nissen fundoplication.  . Shortness of breath   . CHF (congestive heart failure)     Medications:  See  EMR   Assessment: Heparin for ACS. Scr <1, CBC stable and wnl.  Goal of Therapy:  Heparin level 0.3-0.7 units/ml Monitor platelets by anticoagulation protocol: Yes   Plan:  -Heparin bolus 4000 units x1 -Heparin 1100 units/hr  -Daily HL, CBC   AlHughes BetterPharmD, BCPS Clinical Pharmacist Pager: [phone]/27/2016 9:07 PM

## 2014-06-29 NOTE — Progress Notes (Signed)
Pt arrived to [facility] from [facility]. Pt ambulated from stretcher to bed with no assist.  Pt in no acute distress. Oriented to room and call bell.

## 2014-06-29 NOTE — ED Notes (Signed)
Bedside commode placed at bedside and rail is down at patients request. Non slip socks provided. All lines are on one side to prevent tangling. Patient has call bell within reach and gait is has been assessed and is steady. Tx team has been made aware of the need for frequent toileting rounds.

## 2014-06-29 NOTE — ED Notes (Addendum)
Pt states "my breathing just doesn't feel right. Ive had a cough and back pain." denies fevers. She wears 4L o2 [place] continuously. She is A&Ox4, able to speak in full sentences now

## 2014-06-29 NOTE — ED Notes (Signed)
Internal medicine at the bedside.

## 2014-06-29 NOTE — H&P (Signed)
Date: 06/29/2014               Patient Name:  [name] MRN: [id number]  DOB: May 04, 1955 Age / Sex: 59 y.o., female   PCP: Oval Linsey, MD         Medical Service: Internal Medicine Teaching Service         Attending Physician: Dr. Annia Belt, MD    First Contact: Dr. Osa Craver  Pager: [phone]  Second Contact: Dr. Bing Neighbors Pager: [phone]       After Hours (After 5p/  First Contact Pager: [phone]  weekends / holidays): Second Contact Pager: [phone]   Chief Complaint: Chest pain  History of Present Illness: [name] is a 59 year old female with severe COPD [home 4 L F7], diastolic CHF, coronary artery disease status post CABG, tobacco abuse who presents with chest pain.  Yesterday, she reports the onset of midsternal chest pain while resting at home. She describes the sensation as "something sitting on my chest." Pain does not radiate or extend anywhere, and she has never had this sensation before. She does not recall any alleviating or exacerbating factors. Of note, she has not taken her Lasix the last 3 days as she reports feeling dizzy and dry. Other than some baseline abdominal pain from her constipation, she denies any nausea, vomiting, diarrhea, fever, sick contacts, wheezing, falls, increased oxygen requirement, missing any of her other medications. She has a history of being hospitalized for acute respiratory failure, usually in the setting of acute COPD exacerbation with or without acute heart failure exacerbation, most recently last month. She does report having received the flu vaccine this past year. She was at home with her daughter and has smoked half pack per day since the age of 89 that denies any recent alcohol or illicit drug use. In the emergency department, her pain improved with sublingual nitroglycerin and morphine 4 mg IV. She also received aspirin 160 mg, Lasix 40 mg IV, and was started on heparin infusion for unstable  angina.   Meds: Current Facility-Administered Medications  Medication Dose Route Frequency Provider Last Rate Last Dose  . acetaminophen (TYLENOL) tablet 650 mg  650 mg Oral Q4H PRN Otho Bellows, MD      . albuterol (PROVENTIL) (2.5 MG/3ML) 0.083% nebulizer solution 3 mL  3 mL Inhalation Q6H PRN Otho Bellows, MD      . aspirin chewable tablet 81 mg  81 mg Oral Daily Otho Bellows, MD      . furosemide (LASIX) tablet 40 mg  40 mg Oral Daily Otho Bellows, MD      . mometasone-formoterol [facility]) 100-5 MCG/ACT inhaler 2 puff  2 puff Inhalation BID Otho Bellows, MD      . morphine 2 MG/ML injection 1-2 mg  1-2 mg Intravenous Q4H PRN Otho Bellows, MD      . nitroGLYCERIN (NITROSTAT) SL tablet 0.4 mg  0.4 mg Sublingual Q5 min PRN Varney Biles, MD   0.4 mg at 06/29/14 1927  . ondansetron (ZOFRAN) injection 4 mg  4 mg Intravenous Q6H PRN Otho Bellows, MD      . polyethylene glycol [facility] / GLYCOLAX) packet 17 g  17 g Oral Daily Otho Bellows, MD      . pravastatin (PRAVACHOL) tablet 40 mg  40 mg Oral q1800 Otho Bellows, MD      . senna-docusate (Senokot-S) tablet 2 tablet  2 tablet Oral QHS Otho Bellows,  MD      . tiotropium (SPIRIVA) inhalation capsule 18 mcg  18 mcg Inhalation Daily Otho Bellows, MD        Allergies: Allergies as of 06/29/2014 - Review Complete 06/29/2014  Allergen Reaction Noted  . Fluconazole Anaphylaxis, Itching, and Swelling    Past Medical History  Diagnosis Date  . Coronary artery disease     S/P PCI of LAD with DES (12/2008). Total occlusion of RCA noted at that time., medically managed. ACS ruled out 03/2009 with Lexiscan myoview . Followed by [place].  . Pulmonary hypertension     2-D Echo (21/3086) - Systolic pressure was moderately increased. PA peak pressure  52mHg. 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 Followed by Dr.  WJoya Gaskins(Velora Heckler  . Diastolic dysfunction     2-D Echo (12/2008) - Normal LV Systolic funciton with EF 60-65%. Grade 1 diastolid dysfunction. No regional wall motion abnormalities. Moderate pulmonary HTN with PA peak pressure 563mg.  . Marland KitchenOPD (chronic obstructive pulmonary disease)     Severe. Gold Stage IV.  PFTs (12/2008) - severe obstructive airway disease. Active tobacco use. Requires 4L O2 at home.  . Pulmonary nodule, right     Small right middle lobe nodule. Stable as of 12/2008.  . Marland Kitchenrediabetes 12/2008    HgbA1c 6.4 (12/2008)  . Hx MRSA infection     Recurrent MRSA thigh abscesses.  . Tobacco abuse     Ongoing.  . Obesity   . Hyperlipidemia   . GERD (gastroesophageal reflux disease)     S/P Nissen fundoplication.  . Shortness of breath   . CHF (congestive heart failure)    Past Surgical History  Procedure Laterality Date  . Total abdominal hysterectomy w/ bilateral salpingoophorectomy    . Nissen fundoplication    . Right heart catheterization N/A 11/09/2012    Procedure: RIGHT HEART CATH;  Surgeon: DaLarey DresserMD;  [facility];  Service: Cardiovascular;  Laterality: N/A;   Family History  Problem Relation Age of Onset  . Heart disease Mother [id number]  Deceased from MI at 60yo. Hypertension Mother   . Heart disease Father [id number]  Deceased of MI age 60yo. Hypertension Father   . Hypertension Brother   . Lung cancer      Grandmother   History   Social History  . Marital Status: Single    Spouse Name: N/A  . Number of Children: N/A  . Years of Education: N/A   Occupational History  . Not on file.   Social History Main Topics  . Smoking status: Current Every Day Smoker -- 0.50 packs/day for 40 years    Types: Cigarettes  . Smokeless tobacco: Never Used     Comment: 1/2 pack or less. Wants to try the patches.  . Alcohol Use: No  . Drug Use: No  . Sexual Activity: Not on file   Other Topics Concern  . Not on file   Social History Narrative    Formerly worked as a caScientist, water qualitynow disabled.   Divorced.   2 grown children.   Lives with her grandson.    Review of Systems: As noted in the history of present illness  Physical Exam: Blood pressure 128/74, pulse 90, temperature 98.3 F (36.8 C), temperature source Oral, resp. rate 18, SpO2 91 %.  General: Obese Caucasian female, sitting at the  edge of the bed, 4 L oxygen by nasal cannula HEENT: PERRL, EOMI, no scleral icterus, oropharynx clear Cardiac: Tachycardic but difficult to auscultate given large body habitus, no rubs, murmurs or gallops Pulm: Poor airflow bilaterally Abd: soft, nontender, nondistended, BS present Ext: warm and well perfused, 1+ trace pitting edema bilaterally Neuro: responds to questions appropriately; moving all extremities freely  Lab results: Basic Metabolic Panel:  Recent Labs  06/29/14 1855  NA 139  K 4.0  CL 91*  CO2 39*  GLUCOSE 102*  BUN 8  CREATININE 0.76  CALCIUM 9.0   CBC:  Recent Labs  06/29/14 1855  WBC 7.8  HGB 13.4  HCT 43.1  MCV 99.8  PLT 159      Imaging results:  Dg Chest 2 View  06/29/2014   CLINICAL DATA:  59 year old female with shortness of breath and midsternal chest pain since yesterday.  EXAM: CHEST  2 VIEW  COMPARISON:  Chest x-ray 05/22/2014.  FINDINGS: There is cephalization of the pulmonary vasculature and slight indistinctness of the interstitial markings suggestive of mild pulmonary edema. No pleural effusions. Mild cardiomegaly. Upper mediastinal contours are within normal limits. Numerous surgical clips near the gastroesophageal junction.  IMPRESSION: 1. The appearance of the chest suggests mild congestive heart failure, as above.   Electronically Signed   By: Vinnie Langton M.D.   On: 06/29/2014 18:47    Other results: EKG: Reviewed and compared with 05/24/2014 Normal sinus rhythm  Right axis deviation T wave pseudonormalization in V5, V6 T-wave flattening in V3 Q waves in V3, stable from  prior   Assessment & Plan by Problem:  Unstable angina: Her symptoms appear consistent with a cardiac etiology given its nonreproducibility, description, location. HEART score 6, which puts her at moderate risk for major adverse cardiac events. She also has risk factors given her obesity, prior history of cardiac disease, active smoking habit, lung disease, family history [mother died of MI in 17s, father died of MI in 27s]. Physical exam findings do not appear consistent with worsening of her lung disease given absence of wheezing. She underwent placement of drug-eluting stent in the proximal LAD after is found to be 90% stenosed on 12/12/2008; also notable was an occluded right coronary artery. Diastolic CHF may be contributory given chest x-ray findings of mild pulmonary edema though BNP was decreased from last hospitalization and physical exam findings were notable for leg swelling but no crackles. -Monitor on telemetry -Check troponins x 3 -Repeat EKG in AM -Continue aspirin 81 mg -Continue morphine 1-2 mg every 4 hours as needed for pain -Continue nitroglycerin sublingual prn for chest pain -Continue pravastatin 40 mg  Severe COPD [home 4 L O2]: FEV1/FVC 61%, FEV1 37% predicted with no improvement on bronchodilators though suggestive of severe airway obstruction as noted on PFTs 12/29/2008. She has been referred to Pulmonology multiple times in follow-up once on 12/11/2008 though has not followed up since citing cost and lack of transportation. Her medications include albuterol, Advair, Spiriva. -Continue albuterol -Continue Dulera 2 puffs twice daily -Continue Spiriva  Diastolic CHF: EF 37-35% with grade 1 diastolic dysfunction as noted on echo 05/25/14. Weight 246 pounds at the time of discharge her most recent hospitalization. -Continue Lasix 40 mg  Tobacco abuse: Continue smoking cessation counseling  Constipation: Continue MiraLAX and Senokot S  #FEN:  -Diet: Heart healthy  #DVT  prophylaxis: Lovenox  #CODE STATUS: FULL CODE -Defer to daughter Nira Conn [phone] if patients lacks decision-making capacity -Confirmed with patient on admission who  also reports that she would want to be intubated for no more than 2 weeks.  Dispo: Disposition is deferred at this time, awaiting improvement of current medical problems.    The patient does have a current PCP Oval Linsey, MD) and does need an [facility] hospital follow-up appointment after discharge.  The patient does have transportation limitations that hinder transportation to clinic appointments.  Signed: Riccardo Dubin, MD 06/29/2014, 10:29 PM

## 2014-06-29 NOTE — ED Notes (Signed)
Pt assisted to bathroom immediately upon arrival to room.  Pt then taken immediately taken to X-ray.

## 2014-06-29 NOTE — ED Provider Notes (Signed)
CSN: [id number]     Arrival date & time 06/29/14  22 History   First MD Initiated Contact with Patient 06/29/14 1836     Chief Complaint  Patient presents with  . Shortness of Breath     (Consider location/radiation/quality/duration/timing/severity/associated sxs/prior Treatment) HPI Comments: Pt comes in with cc of chest pain and DIB. Pt has hx of CAD, s/p stent placement, COPD on 4 L O2, CHF, Pulm HTN. She reports that she stopped taking her lasix 3 days ago. Titus Dubin, she started having some dib. She notices now, that just mild exertion gets her short of breath. This morning, she started having chest pain. Chest pain is described as heaviness in the middle of the chest. There is no associated diophoresis, and she has no specific aggravating or relieving factors. No increased wheezing, leg swelling or cough.    ROS 10 Systems reviewed and are negative for acute change except as noted in the HPI.     Patient is a 59 y.o. female presenting with shortness of breath. The history is provided by the patient.  Shortness of Breath Associated symptoms: chest pain     Past Medical History  Diagnosis Date  . Coronary artery disease     S/P PCI of LAD with DES (12/2008). Total occlusion of RCA noted at that time., medically managed. ACS ruled out 03/2009 with Lexiscan myoview . Followed by [facility].  . Pulmonary hypertension     2-D Echo (65/6812) - Systolic pressure was moderately increased. PA peak pressure  73mHg. 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 Followed by Dr. WJoya Gaskins(Velora Heckler  . Diastolic dysfunction     2-D Echo (12/2008) - Normal LV Systolic funciton with EF 60-65%. Grade 1 diastolid dysfunction. No regional wall motion abnormalities. Moderate pulmonary HTN with PA peak pressure 527mg.  . Marland KitchenOPD (chronic obstructive pulmonary disease)     Severe. Gold Stage IV.  PFTs (12/2008)  - severe obstructive airway disease. Active tobacco use. Requires 4L O2 at home.  . Pulmonary nodule, right     Small right middle lobe nodule. Stable as of 12/2008.  . Marland Kitchenrediabetes 12/2008    HgbA1c 6.4 (12/2008)  . Hx MRSA infection     Recurrent MRSA thigh abscesses.  . Tobacco abuse     Ongoing.  . Obesity   . Hyperlipidemia   . GERD (gastroesophageal reflux disease)     S/P Nissen fundoplication.  . Shortness of breath   . CHF (congestive heart failure)    Past Surgical History  Procedure Laterality Date  . Total abdominal hysterectomy w/ bilateral salpingoophorectomy    . Nissen fundoplication    . Right heart catheterization N/A 11/09/2012    Procedure: RIGHT HEART CATH;  Surgeon: DaLarey DresserMD;  [facility];  Service: Cardiovascular;  Laterality: N/A;   Family History  Problem Relation Age of Onset  . Heart disease Mother [id number]  Deceased from MI at 60yo. Hypertension Mother   . Heart disease Father 5421  Deceased of MI age 60yo. Hypertension Father   . Hypertension Brother   . Lung cancer      Grandmother   History  Substance Use Topics  . Smoking status: Current Every Day Smoker -- 0.50 packs/day for 40 years    Types: Cigarettes  . Smokeless tobacco: Never Used     Comment: 1/2 pack  or less. Wants to try the patches.  . Alcohol Use: No   OB History    No data available     Review of Systems  Respiratory: Positive for shortness of breath.   Cardiovascular: Positive for chest pain.  All other systems reviewed and are negative.     Allergies  Fluconazole  Home Medications   Prior to Admission medications   Medication Sig Start Date End Date Taking? Authorizing Provider  albuterol (PROVENTIL HFA;VENTOLIN HFA) 108 (90 BASE) MCG/ACT inhaler Inhale 2 puffs into the lungs 3 (three) times daily as needed for wheezing or shortness of breath. 03/11/14  Yes Jessee Avers, MD  albuterol (PROVENTIL) (2.5 MG/3ML) 0.083% nebulizer solution  Take 2.5 mg by nebulization daily as needed for wheezing or shortness of breath.   Yes Historical Provider, MD  Aspirin-Salicylamide-Caffeine (BC HEADACHE POWDER PO) Take 1 packet by mouth every 6 (six) hours as needed (headache).   Yes Historical Provider, MD  Fluticasone-Salmeterol (ADVAIR) 250-50 MCG/DOSE AEPB Inhale 1 puff into the lungs 2 (two) times daily.   Yes Historical Provider, MD  furosemide (LASIX) 40 MG tablet Take 1 tablet (40 mg total) by mouth daily. Please note higher strength tablet.  Only take 1 tablet daily. 05/29/14  Yes Oval Linsey, MD  lovastatin (MEVACOR) 20 MG tablet Take 1 tablet (20 mg total) by mouth at bedtime. 05/29/14  Yes Oval Linsey, MD  SYMBICORT 160-4.5 MCG/ACT inhaler Inhale 1 puff into the lungs daily. 03/04/14  Yes Historical Provider, MD  tiotropium (SPIRIVA HANDIHALER) 18 MCG inhalation capsule Place 1 capsule (18 mcg total) into inhaler and inhale daily. 05/29/14  Yes Oval Linsey, MD  albuterol (PROVENTIL HFA;VENTOLIN HFA) 108 (90 BASE) MCG/ACT inhaler Inhale 2 puffs into the lungs every 6 (six) hours as needed for wheezing or shortness of breath. 05/27/14   Norman Herrlich, MD  aspirin 81 MG chewable tablet Chew 1 tablet (81 mg total) by mouth daily. 05/27/14   Norman Herrlich, MD  diclofenac sodium (VOLTAREN) 1 % GEL Apply 2 g topically 4 (four) times daily. 04/12/14   Ejiroghene Arlyce Dice, MD  polyethylene glycol (MIRALAX / GLYCOLAX) packet Take 17 g by mouth daily. 04/12/14   Ejiroghene Arlyce Dice, MD  senna-docusate (SENOKOT-S) 8.6-50 MG per tablet Take 2 tablets by mouth at bedtime. 04/12/14   Ejiroghene E Emokpae, MD   BP 111/67 mmHg  Pulse 83  Temp(Src) 98.8 F (37.1 C) (Oral)  Resp 19  SpO2 95% Physical Exam  Constitutional: She is oriented to person, place, and time. She appears well-developed and well-nourished.  HENT:  Head: Normocephalic and atraumatic.  Eyes: EOM are normal. Pupils are equal, round, and reactive to light.  Neck: Neck supple.  JVD present.  Cardiovascular: Normal rate and regular rhythm.   Murmur heard. Pulmonary/Chest: Effort normal. No respiratory distress.  Abdominal: Soft. She exhibits no distension. There is no tenderness. There is no rebound and no guarding.  Musculoskeletal: She exhibits no tenderness.  Trace bilateral pitting edema  Neurological: She is alert and oriented to person, place, and time.  Skin: Skin is warm and dry.  Nursing note and vitals reviewed.   ED Course  Procedures (including critical care time) Labs Review Labs Reviewed  BASIC METABOLIC PANEL - Abnormal; Notable for the following:    Chloride 91 (*)    CO2 39 (*)    Glucose, Bld 102 (*)    GFR calc non Af Amer 90 (*)    All other components within  normal limits  BRAIN NATRIURETIC PEPTIDE - Abnormal; Notable for the following:    B Natriuretic Peptide 195.1 (*)    All other components within normal limits  CBC  HEPARIN LEVEL (UNFRACTIONATED)  CBC  I-STAT TROPOININ, ED    Imaging Review Dg Chest 2 View  06/29/2014   CLINICAL DATA:  59 year old female with shortness of breath and midsternal chest pain since yesterday.  EXAM: CHEST  2 VIEW  COMPARISON:  Chest x-ray 05/22/2014.  FINDINGS: There is cephalization of the pulmonary vasculature and slight indistinctness of the interstitial markings suggestive of mild pulmonary edema. No pleural effusions. Mild cardiomegaly. Upper mediastinal contours are within normal limits. Numerous surgical clips near the gastroesophageal junction.  IMPRESSION: 1. The appearance of the chest suggests mild congestive heart failure, as above.   Electronically Signed   By: Vinnie Langton M.D.   On: 06/29/2014 18:47     EKG Interpretation   Date/Time:  Sunday June 29 2014 18:04:47 EDT Ventricular Rate:  81 PR Interval:  136 QRS Duration: 80 QT Interval:  354 QTC Calculation: 411 R Axis:   89 Text Interpretation:  Normal sinus rhythm RSR' or QR pattern in V1  suggests right ventricular  conduction delay Nonspecific T wave abnormality  artifact vs flutter wave in lead II Abnormal ECG Confirmed by Kathrynn Humble,  MD, Thelma Comp [phone]) on 06/29/2014 6:39:32 PM      MDM   Final diagnoses:  Unstable angina  Acute systolic congestive heart failure    Differential diagnosis includes: ACS syndrome CHF exacerbation COPD exacerbation Valvular disorder Pericardial effusion Pleural effusion/ Pulmonary edema PE Anemia Musculoskeletal pain  Pt comes in with cc of chest pain and dib. Pt has CAD and cardiac risk factors. She has no evidence of wheezing on exam - and this is unlikely to be COPD. Clinically, with dyspnea on exertion, + JVD, med non compliance - this appears to be CHF exacerbation. CXR shows mild congestion only. Pt has had COPD and CHF exacerbation - no chest pain like such in the past -and so we have to consider ACS in the ddx. Pt given nitro - pain went from 8/10 to "0.5/10." Pt will get morphine. Once pain free, she will be moves to telemetry. Heparin started here, as this could be unstable angina. HD stable, ekg has no acute changes.  CRITICAL CARE Performed by: Varney Biles   Total critical care time: 32 minutes  Critical care time was exclusive of separately billable procedures and treating other patients.  Critical care was necessary to treat or prevent imminent or life-threatening deterioration.  Critical care was time spent personally by me on the following activities: development of treatment plan with patient and/or surrogate as well as nursing, discussions with consultants, evaluation of patient's response to treatment, examination of patient, obtaining history from patient or surrogate, ordering and performing treatments and interventions, ordering and review of laboratory studies, ordering and review of radiographic studies, pulse oximetry and re-evaluation of patient's condition.   Varney Biles, MD 06/29/14 2144

## 2014-06-30 ENCOUNTER — Observation Stay (HOSPITAL_COMMUNITY): Payer: Commercial Managed Care - HMO

## 2014-06-30 ENCOUNTER — Encounter (HOSPITAL_COMMUNITY): Payer: Self-pay | Admitting: Cardiology

## 2014-06-30 DIAGNOSIS — R079 Chest pain, unspecified: Secondary | ICD-10-CM | POA: Diagnosis not present

## 2014-06-30 DIAGNOSIS — I200 Unstable angina: Secondary | ICD-10-CM | POA: Diagnosis not present

## 2014-06-30 DIAGNOSIS — I272 Other secondary pulmonary hypertension: Secondary | ICD-10-CM | POA: Diagnosis not present

## 2014-06-30 DIAGNOSIS — I5021 Acute systolic (congestive) heart failure: Secondary | ICD-10-CM | POA: Diagnosis not present

## 2014-06-30 DIAGNOSIS — E669 Obesity, unspecified: Secondary | ICD-10-CM | POA: Diagnosis not present

## 2014-06-30 DIAGNOSIS — E785 Hyperlipidemia, unspecified: Secondary | ICD-10-CM | POA: Diagnosis not present

## 2014-06-30 DIAGNOSIS — R072 Precordial pain: Secondary | ICD-10-CM

## 2014-06-30 DIAGNOSIS — J449 Chronic obstructive pulmonary disease, unspecified: Secondary | ICD-10-CM

## 2014-06-30 DIAGNOSIS — K219 Gastro-esophageal reflux disease without esophagitis: Secondary | ICD-10-CM | POA: Diagnosis not present

## 2014-06-30 DIAGNOSIS — R0789 Other chest pain: Secondary | ICD-10-CM | POA: Diagnosis not present

## 2014-06-30 DIAGNOSIS — F17200 Nicotine dependence, unspecified, uncomplicated: Secondary | ICD-10-CM | POA: Diagnosis not present

## 2014-06-30 DIAGNOSIS — I5030 Unspecified diastolic (congestive) heart failure: Secondary | ICD-10-CM

## 2014-06-30 DIAGNOSIS — I2510 Atherosclerotic heart disease of native coronary artery without angina pectoris: Secondary | ICD-10-CM | POA: Diagnosis not present

## 2014-06-30 DIAGNOSIS — S7002XA Contusion of left hip, initial encounter: Secondary | ICD-10-CM | POA: Diagnosis not present

## 2014-06-30 DIAGNOSIS — K5900 Constipation, unspecified: Secondary | ICD-10-CM

## 2014-06-30 DIAGNOSIS — F17210 Nicotine dependence, cigarettes, uncomplicated: Secondary | ICD-10-CM | POA: Diagnosis not present

## 2014-06-30 LAB — TROPONIN I
Troponin I: <0.03 ng/mL (ref <0.031)
Troponin I: <0.03 ng/mL (ref <0.031)
Troponin I: <0.03 ng/mL (ref <0.031)
Troponin I: <0.03 ng/mL (ref <0.031)
Troponin I: <0.03 ng/mL (ref <0.031)

## 2014-06-30 LAB — CBC
HCT: 44.2 % (ref 36.0–46.0)
Hemoglobin: 13.5 g/dL (ref 12.0–15.0)
MCH: 31 pg (ref 26.0–34.0)
MCHC: 30.5 g/dL (ref 30.0–36.0)
MCV: 101.6 fL — AB (ref 78.0–100.0)
PLATELETS: 145 10*3/uL — AB (ref 150–400)
RBC: 4.35 MIL/uL (ref 3.87–5.11)
RDW: 13.9 % (ref 11.5–15.5)
WBC: 8.4 10*3/uL (ref 4.0–10.5)

## 2014-06-30 MED ORDER — ENOXAPARIN SODIUM 120 MG/0.8ML ~~LOC~~ SOLN
115.0000 mg | Freq: Two times a day (BID) | SUBCUTANEOUS | Status: DC
  Administered 2014-06-30 – 2014-07-01 (×4): 115 mg via SUBCUTANEOUS
  Filled 2014-06-30 (×5): qty 0.8

## 2014-06-30 NOTE — Progress Notes (Signed)
UR completed.

## 2014-06-30 NOTE — Progress Notes (Signed)
Subjective:  Patient was seen and examined this morning. Patient states her pain is much improved to a 1/10, located substernally, not reproducible with palpation, no radiation of pain. Pain was relieved yesterday with nitroglycerin and did not feel similar in nature to her chest pain in the past. She denies symptoms of reflux or anxiety. No nausea or vomiting.   Objective: Vital signs in last 24 hours: Filed Vitals:   06/29/14 2156 06/29/14 2333 06/30/14 0534 06/30/14 0936  BP: 128/74 120/75 114/60   Pulse: 90 86 77   Temp: 98.3 F (36.8 C) 98.9 F (37.2 C) 98.6 F (37 C)   TempSrc: Oral Oral Oral   Resp: _0 Height:  _1  (1.651 m)    Weight:  253 lb 15.5 oz (115.2 kg)    SpO2: 91% 91% 92% 90%   Weight change:   Intake/Output Summary (Last 24 hours) at 06/30/14 1034 Last data filed at 06/30/14 1004  Gross per 24 hour  Intake    120 ml  Output    500 ml  Net   -380 ml   General: Vital signs reviewed.  Patient is obese, in no acute distress and cooperative with exam.  Cardiovascular: RRR. Pain not reproducible with palpation of chest wall. Pulmonary/Chest: Clear to auscultation bilaterally, no wheezes, rales, or rhonchi.  Abdominal: Soft, non-tender, non-distended, BS + Extremities: 1-2+ pitting edema in right lower extremity. Trace pitting edema in left lower extremity. No calf tenderness. Neurological: A&O x3 Skin: Warm, dry and intact. No rashes or erythema. Psychiatric: Normal mood and affect. speech and behavior is normal. Cognition and memory are normal.   Lab Results: Basic Metabolic Panel:  Recent Labs Lab 06/29/14 1855  NA 139  K 4.0  CL 91*  CO2 39*  GLUCOSE 102*  BUN 8  CREATININE 0.76  CALCIUM 9.0   CBC:  Recent Labs Lab 06/29/14 1855 06/30/14 0207  WBC 7.8 8.4  HGB 13.4 13.5  HCT 43.1 44.2  MCV 99.8 101.6*  PLT 159 145*   Cardiac Enzymes:  Recent Labs Lab 06/30/14 0207 06/30/14 0500 06/30/14 0858  TROPONINI <0.03 <0.03  <0.03   Urine Drug Screen: Drugs of Abuse     Component Value Date/Time   LABOPIA NONE DETECTED 07/01/2013 1218   LABOPIA NEGATIVE 02/04/2009 2315   COCAINSCRNUR NONE DETECTED 07/01/2013 1218   COCAINSCRNUR NEGATIVE 02/04/2009 2315   LABBENZ NONE DETECTED 07/01/2013 1218   LABBENZ NEGATIVE 02/04/2009 2315   AMPHETMU NONE DETECTED 07/01/2013 1218   AMPHETMU NEGATIVE 02/04/2009 2315   THCU NONE DETECTED 07/01/2013 1218   LABBARB NONE DETECTED 07/01/2013 1218    Studies/Results: Dg Chest 2 View  06/29/2014   CLINICAL DATA:  59 year old female with shortness of breath and midsternal chest pain since yesterday.  EXAM: CHEST  2 VIEW  COMPARISON:  Chest x-ray 05/22/2014.  FINDINGS: There is cephalization of the pulmonary vasculature and slight indistinctness of the interstitial markings suggestive of mild pulmonary edema. No pleural effusions. Mild cardiomegaly. Upper mediastinal contours are within normal limits. Numerous surgical clips near the gastroesophageal junction.  IMPRESSION: 1. The appearance of the chest suggests mild congestive heart failure, as above.   Electronically Signed   By: Vinnie Langton M.D.   On: 06/29/2014 18:47   Medications:  I have reviewed the patient's current medications. Prior to Admission:  Prescriptions prior to admission  Medication Sig Dispense Refill Last Dose  . albuterol (PROVENTIL HFA;VENTOLIN HFA) 108 (90 BASE) MCG/ACT inhaler Inhale  2 puffs into the lungs 3 (three) times daily as needed for wheezing or shortness of breath. 3.7 g 11 06/29/2014 at Unknown time  . albuterol (PROVENTIL) (2.5 MG/3ML) 0.083% nebulizer solution Take 2.5 mg by nebulization daily as needed for wheezing or shortness of breath.   Past Week at Unknown time  . Aspirin-Salicylamide-Caffeine (BC HEADACHE POWDER PO) Take 1 packet by mouth every 6 (six) hours as needed (headache).   06/29/2014 at Unknown time  . Fluticasone-Salmeterol (ADVAIR) 250-50 MCG/DOSE AEPB Inhale 1 puff into the  lungs 2 (two) times daily.   06/29/2014 at Unknown time  . furosemide (LASIX) 40 MG tablet Take 1 tablet (40 mg total) by mouth daily. Please note higher strength tablet.  Only take 1 tablet daily. 30 tablet 2 Past Week at Unknown time  . lovastatin (MEVACOR) 20 MG tablet Take 1 tablet (20 mg total) by mouth at bedtime. 90 tablet 3 Past Week at Unknown time  . SYMBICORT 160-4.5 MCG/ACT inhaler Inhale 1 puff into the lungs daily.  11 06/29/2014 at Unknown time  . tiotropium (SPIRIVA HANDIHALER) 18 MCG inhalation capsule Place 1 capsule (18 mcg total) into inhaler and inhale daily. 30 capsule 12 06/29/2014 at Unknown time  . albuterol (PROVENTIL HFA;VENTOLIN HFA) 108 (90 BASE) MCG/ACT inhaler Inhale 2 puffs into the lungs every 6 (six) hours as needed for wheezing or shortness of breath. 1 Inhaler 2   . aspirin 81 MG chewable tablet Chew 1 tablet (81 mg total) by mouth daily. 30 tablet 11   . diclofenac sodium (VOLTAREN) 1 % GEL Apply 2 g topically 4 (four) times daily. 1 Tube 0 Past Month at Unknown time  . polyethylene glycol (MIRALAX / GLYCOLAX) packet Take 17 g by mouth daily. 14 each 0   . senna-docusate (SENOKOT-S) 8.6-50 MG per tablet Take 2 tablets by mouth at bedtime. 60 tablet 0 Past Week at Unknown time   Scheduled Meds: . aspirin  81 mg Oral Daily  . enoxaparin (LOVENOX) injection  115 mg Subcutaneous Q12H  . furosemide  40 mg Oral Daily  . mometasone-formoterol  2 puff Inhalation BID  . polyethylene glycol  17 g Oral Daily  . pravastatin  40 mg Oral q1800  . senna-docusate  2 tablet Oral QHS  . tiotropium  18 mcg Inhalation Daily   Continuous Infusions:  PRN Meds:.acetaminophen, albuterol, morphine injection, nitroGLYCERIN, ondansetron (ZOFRAN) IV Assessment/Plan: Principal Problem:   Chest pain Active Problems:   TOBACCO ABUSE   Coronary atherosclerosis   Severe chronic obstructive pulmonary disease   Hyperlipidemia   Acute on chronic diastolic CHF (congestive heart  failure)  Unstable Angina: Chest pain has improved from yesterday. Troponins negative x 3 (but cycled 2 hours apart). EKG shows old biphasic TWI in V4 and 5 with a possible new TWI in V6. HEART score 6, which puts her at moderate risk for major adverse cardiac events. History of drug-eluting stent in the proximal LAD after it was found to be 90% stenosed on 12/12/2008; also notable was an occluded right coronary artery. Patient was given ASA 325 and heparin yesterday for concern for ACS. Given history of stent placement and recurrent chest pain, patient may benefit from a myoview. We will consult cardiology to see if they have any further inpatient recommendations or if patient can be managed as an outpatient.  -Consult to Cardiology, appreciate recommendations -Monitor on telemetry -Trend 2 more troponins -Continue aspirin 81 mg daily -Continue morphine 1-2 mg Q4H prn -Continue nitroglycerin sublingual prn  for chest pain -Continue pravastatin 40 mg daily  Severe COPD [home 4 L O2]: FEV1/FVC 61%, FEV1 37% predicted with no improvement on bronchodilators though suggestive of severe airway obstruction as noted on PFTs 12/29/2008. Her home medications include albuterol, Advair, Spiriva. Patient is satting 90-92% on 4 L [place]. No wheezes or rhonchi on auscultation.  -Continue albuterol Q6H prn  -Continue Dulera 2 puffs BID -Continue Spiriva daily  Diastolic CHF: EF 91-66% with grade 1 diastolic dysfunction as noted on echo 05/25/14. Patient had not taken lasix in 3 days due to feeling unwell. BNP elevated at 195.1, but lower than previous BNP 536.0 on 05/22/14. 1-2+ piitting edema in right lower extremity, trace in left. No crackles on examination.  -Continue Lasix 40 mg daily  Chronic Left Hip Pain: Patient states she continues to have pain in her left hip that has been addressed at prior office visits. Not recently imaged.  -Left Hip Xray -On morphine as above  Tobacco abuse: Patient continues to smoke  1/2 ppd.  -Continue smoking cessation counseling  FEN:  -Diet: Heart healthy  DVT prophylaxis: Lovenox SQ QD  CODE STATUS: FULL CODE -Defer to daughter Nira Conn [phone] if patients lacks decision-making capacity -Confirmed with patient on admission who also reports that she would want to be intubated for no more than 2 weeks.  Dispo: Disposition is deferred at this time, awaiting improvement of current medical problems.  Anticipated discharge in approximately 1-2 day(s).   The patient does have a current PCP Oval Linsey, MD) and does need an [facility] hospital follow-up appointment after discharge.  The patient does not have transportation limitations that hinder transportation to clinic appointments.  .Services Needed at time of discharge: Y = Yes, Blank = No PT:   OT:   RN:   Equipment:   Other:     LOS: 1 day   Osa Craver, DO PGY-1 Internal Medicine Resident Pager # [phone] 06/30/2014 10:34 AM

## 2014-06-30 NOTE — Consult Note (Signed)
Primary cardiologist: Dr Percival Spanish  HPI: 59 year old female with past medical history of coronary artery disease, severe home O2 dependent COPD for evaluation of chest pain. Patient has had previous PCI of her LAD in 2010; RCA occluded at that time. Right heart catheterization August 2014 showed PA pressure 49/27 and pulmonary capillary wedge pressure 11. Patient has chronic dyspnea on exertion. Occasional orthopnea and pedal edema. She typically does not have exertional chest pain. 2 days ago she complained of general malaise. Yesterday she developed pain in the substernal area without radiation. Described as a sharp pain. No associated symptoms. Not pleuritic, positional or related to food. Pain lasted for 30 minutes and resolved. She had 2 subsequent episodes of similar description. No pain since last evening. Cardiology asked to evaluate.  Medications Prior to Admission  Medication Sig Dispense Refill  . albuterol (PROVENTIL HFA;VENTOLIN HFA) 108 (90 BASE) MCG/ACT inhaler Inhale 2 puffs into the lungs 3 (three) times daily as needed for wheezing or shortness of breath. 3.7 g 11  . albuterol (PROVENTIL) (2.5 MG/3ML) 0.083% nebulizer solution Take 2.5 mg by nebulization daily as needed for wheezing or shortness of breath.    . Aspirin-Salicylamide-Caffeine (BC HEADACHE POWDER PO) Take 1 packet by mouth every 6 (six) hours as needed (headache).    . Fluticasone-Salmeterol (ADVAIR) 250-50 MCG/DOSE AEPB Inhale 1 puff into the lungs 2 (two) times daily.    . furosemide (LASIX) 40 MG tablet Take 1 tablet (40 mg total) by mouth daily. Please note higher strength tablet.  Only take 1 tablet daily. 30 tablet 2  . lovastatin (MEVACOR) 20 MG tablet Take 1 tablet (20 mg total) by mouth at bedtime. 90 tablet 3  . SYMBICORT 160-4.5 MCG/ACT inhaler Inhale 1 puff into the lungs daily.  11  . tiotropium (SPIRIVA HANDIHALER) 18 MCG inhalation capsule Place 1 capsule (18 mcg total) into inhaler and inhale daily.  30 capsule 12  . albuterol (PROVENTIL HFA;VENTOLIN HFA) 108 (90 BASE) MCG/ACT inhaler Inhale 2 puffs into the lungs every 6 (six) hours as needed for wheezing or shortness of breath. 1 Inhaler 2  . aspirin 81 MG chewable tablet Chew 1 tablet (81 mg total) by mouth daily. 30 tablet 11  . diclofenac sodium (VOLTAREN) 1 % GEL Apply 2 g topically 4 (four) times daily. 1 Tube 0  . polyethylene glycol (MIRALAX / GLYCOLAX) packet Take 17 g by mouth daily. 14 each 0  . senna-docusate (SENOKOT-S) 8.6-50 MG per tablet Take 2 tablets by mouth at bedtime. 60 tablet 0    Allergies  Allergen Reactions  . Fluconazole Anaphylaxis, Itching and Swelling    Past Medical History  Diagnosis Date  . Coronary artery disease     S/P PCI of LAD with DES (12/2008). Total occlusion of RCA noted at that time., medically managed. ACS ruled out 03/2009 with Lexiscan myoview . Followed by [facility].  . Pulmonary hypertension     2-D Echo (84/1660) - Systolic pressure was moderately increased. PA peak pressure  6mHg. 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 Followed by Dr. WJoya Gaskins(Velora Heckler  . Diastolic dysfunction     2-D Echo (12/2008) - Normal LV Systolic funciton with EF 60-65%. Grade 1 diastolid dysfunction. No regional wall motion abnormalities. Moderate pulmonary HTN with PA peak pressure 569mg.  . Marland KitchenOPD (chronic obstructive pulmonary disease)     Severe. Gold Stage IV.  PFTs (12/2008) -  severe obstructive airway disease. Active tobacco use. Requires 4L O2 at home.  . Pulmonary nodule, right     Small right middle lobe nodule. Stable as of 12/2008.  Marland Kitchen Prediabetes 12/2008    HgbA1c 6.4 (12/2008)  . Hx MRSA infection     Recurrent MRSA thigh abscesses.  . Tobacco abuse     Ongoing.  . Obesity   . Hyperlipidemia   . GERD (gastroesophageal reflux disease)     S/P Nissen fundoplication.  . Shortness of  breath   . CHF (congestive heart failure)     Past Surgical History  Procedure Laterality Date  . Total abdominal hysterectomy w/ bilateral salpingoophorectomy    . Nissen fundoplication    . Right heart catheterization N/A 11/09/2012    Procedure: RIGHT HEART CATH;  Surgeon: Larey Dresser, MD;  [facility] C[facility] LAB;  Service: Cardiovascular;  Laterality: N/A;    History   Social History  . Marital Status: Single    Spouse Name: N/A  . Number of Children: N/A  . Years of Education: N/A   Occupational History  . Not on file.   Social History Main Topics  . Smoking status: Current Every Day Smoker -- 0.50 packs/day for 40 years    Types: Cigarettes  . Smokeless tobacco: Never Used     Comment: 1/2 pack or less. Wants to try the patches.  . Alcohol Use: No  . Drug Use: No  . Sexual Activity: Not on file   Other Topics Concern  . Not on file   Social History Narrative   Formerly worked as a Scientist, water quality, now disabled.   Divorced.   2 grown children.   Lives with her grandson.    Family History  Problem Relation Age of Onset  . Heart disease Mother 72    Deceased from MI at 17yo  . Hypertension Mother   . Heart disease Father 24    Deceased of MI age 66yo  . Hypertension Father   . Hypertension Brother   . Lung cancer      Grandmother    ROS:  Positive nonproductive cough but no fevers or chills, hemoptysis, dysphasia, odynophagia, melena, hematochezia, dysuria, hematuria, rash, seizure activity, orthopnea, PND, claudication. Remaining systems are negative.  Physical Exam:   Blood pressure 114/60, pulse 77, temperature 98.6 F (37 C), temperature source Oral, resp. rate 20, height _0  (1.651 m), weight 253 lb 15.5 oz (115.2 kg), SpO2 90 %.  General:  Well developed/obese, chronically ill appearing in NAD Skin warm/dry Patient not depressed No peripheral clubbing Back-normal HEENT-normal/normal eyelids Neck supple/normal carotid upstroke bilaterally; no  bruits; no JVD; no thyromegaly chest - diminished BS throughout; mild basilar crackles CV - RRR/normal S1 and S2; no murmurs, rubs or gallops;  PMI nondisplaced Abdomen -NT/ND, no HSM, no mass, + bowel sounds, no bruit 2+ femoral pulses, no bruits Ext-no edema, chords, 2+ DP Neuro-grossly nonfocal  ECG sinus rhythm, RV conduction delay, right axis deviation, nonspecific ST changes.  Results for orders placed or performed during the hospital encounter of 06/29/14 (from the past 48 hour(s))  CBC     Status: None   Collection Time: 06/29/14  6:55 PM  Result Value Ref Range   WBC 7.8 4.0 - 10.5 K/uL   RBC 4.32 3.87 - 5.11 MIL/uL   Hemoglobin 13.4 12.0 - 15.0 g/dL   HCT 43.1 36.0 - 46.0 %   MCV 99.8 78.0 - 100.0 fL   MCH 31.0 26.0 -  34.0 pg   MCHC 31.1 30.0 - 36.0 g/dL   RDW 13.9 11.5 - 15.5 %   Platelets 159 150 - 400 K/uL  Basic metabolic panel     Status: Abnormal   Collection Time: 06/29/14  6:55 PM  Result Value Ref Range   Sodium 139 135 - 145 mmol/L   Potassium 4.0 3.5 - 5.1 mmol/L   Chloride 91 (L) 96 - 112 mmol/L   CO2 39 (H) 19 - 32 mmol/L   Glucose, Bld 102 (H) 70 - 99 mg/dL   BUN 8 6 - 23 mg/dL   Creatinine, Ser 0.76 0.50 - 1.10 mg/dL   Calcium 9.0 8.4 - 10.5 mg/dL   GFR calc non Af Amer 90 (L) >90 mL/min   GFR calc Af Amer >90 >90 mL/min    Comment: (NOTE) The eGFR has been calculated using the CKD EPI equation. This calculation has not been validated in all clinical situations. eGFR's persistently <90 mL/min signify possible Chronic Kidney Disease.    Anion gap 9 5 - 15  BNP (order ONLY if patient complains of dyspnea/SOB AND you have documented it for THIS visit)     Status: Abnormal   Collection Time: 06/29/14  6:55 PM  Result Value Ref Range   B Natriuretic Peptide 195.1 (H) 0.0 - 100.0 pg/mL  I-stat troponin, ED (not at [facility])     Status: None   Collection Time: 06/29/14  7:04 PM  Result Value Ref Range   Troponin i, poc 0.01 0.00 - 0.08 ng/mL   Comment 3             Comment: Due to the release kinetics of cTnI, a negative result within the first hours of the onset of symptoms does not rule out myocardial infarction with certainty. If myocardial infarction is still suspected, repeat the test at appropriate intervals.   Troponin I-serum (0, 3, 6 hours)     Status: None   Collection Time: 06/29/14 11:35 PM  Result Value Ref Range   Troponin I <0.03 <0.031 ng/mL    Comment:        NO INDICATION OF MYOCARDIAL INJURY.   CBC     Status: Abnormal   Collection Time: 06/30/14  2:07 AM  Result Value Ref Range   WBC 8.4 4.0 - 10.5 K/uL   RBC 4.35 3.87 - 5.11 MIL/uL   Hemoglobin 13.5 12.0 - 15.0 g/dL   HCT 44.2 36.0 - 46.0 %   MCV 101.6 (H) 78.0 - 100.0 fL   MCH 31.0 26.0 - 34.0 pg   MCHC 30.5 30.0 - 36.0 g/dL   RDW 13.9 11.5 - 15.5 %   Platelets 145 (L) 150 - 400 K/uL  Troponin I-serum (0, 3, 6 hours)     Status: None   Collection Time: 06/30/14  2:07 AM  Result Value Ref Range   Troponin I <0.03 <0.031 ng/mL    Comment:        NO INDICATION OF MYOCARDIAL INJURY.   Troponin I-serum (0, 3, 6 hours)     Status: None   Collection Time: 06/30/14  5:00 AM  Result Value Ref Range   Troponin I <0.03 <0.031 ng/mL    Comment:        NO INDICATION OF MYOCARDIAL INJURY.     Dg Chest 2 View  06/29/2014   CLINICAL DATA:  59 year old female with shortness of breath and midsternal chest pain since yesterday.  EXAM: CHEST  2 VIEW  COMPARISON:  Chest x-ray 05/22/2014.  FINDINGS: There is cephalization of the pulmonary vasculature and slight indistinctness of the interstitial markings suggestive of mild pulmonary edema. No pleural effusions. Mild cardiomegaly. Upper mediastinal contours are within normal limits. Numerous surgical clips near the gastroesophageal junction.  IMPRESSION: 1. The appearance of the chest suggests mild congestive heart failure, as above.   Electronically Signed   By: Vinnie Langton M.D.   On: 06/29/2014 18:47     Assessment/Plan 1 chest pain-symptoms atypical. Enzymes negative. Will arrange nuclear study for risk stratification tomorrow morning. 2 coronary artery disease-continue aspirin and statin. 3 COPD-home O2 dependent 4 tobacco abuse-patient counseled on discontinuing. 5 history of chronic diastolic/right heart failure-euvolemic on examination. Continue present dose of Lasix.  Kirk Ruths MD 06/30/2014, 10:10 AM

## 2014-07-01 ENCOUNTER — Observation Stay (HOSPITAL_COMMUNITY): Payer: Commercial Managed Care - HMO

## 2014-07-01 DIAGNOSIS — I5033 Acute on chronic diastolic (congestive) heart failure: Secondary | ICD-10-CM

## 2014-07-01 DIAGNOSIS — F17200 Nicotine dependence, unspecified, uncomplicated: Secondary | ICD-10-CM | POA: Diagnosis not present

## 2014-07-01 DIAGNOSIS — I5021 Acute systolic (congestive) heart failure: Secondary | ICD-10-CM | POA: Diagnosis not present

## 2014-07-01 DIAGNOSIS — F17210 Nicotine dependence, cigarettes, uncomplicated: Secondary | ICD-10-CM | POA: Diagnosis not present

## 2014-07-01 DIAGNOSIS — I200 Unstable angina: Secondary | ICD-10-CM | POA: Diagnosis not present

## 2014-07-01 DIAGNOSIS — M1612 Unilateral primary osteoarthritis, left hip: Secondary | ICD-10-CM

## 2014-07-01 DIAGNOSIS — E669 Obesity, unspecified: Secondary | ICD-10-CM | POA: Diagnosis not present

## 2014-07-01 DIAGNOSIS — I272 Other secondary pulmonary hypertension: Secondary | ICD-10-CM | POA: Diagnosis not present

## 2014-07-01 DIAGNOSIS — R0789 Other chest pain: Secondary | ICD-10-CM | POA: Diagnosis not present

## 2014-07-01 DIAGNOSIS — E785 Hyperlipidemia, unspecified: Secondary | ICD-10-CM | POA: Diagnosis not present

## 2014-07-01 DIAGNOSIS — R079 Chest pain, unspecified: Secondary | ICD-10-CM | POA: Diagnosis not present

## 2014-07-01 DIAGNOSIS — K219 Gastro-esophageal reflux disease without esophagitis: Secondary | ICD-10-CM | POA: Diagnosis not present

## 2014-07-01 DIAGNOSIS — I2510 Atherosclerotic heart disease of native coronary artery without angina pectoris: Secondary | ICD-10-CM | POA: Diagnosis not present

## 2014-07-01 DIAGNOSIS — J449 Chronic obstructive pulmonary disease, unspecified: Secondary | ICD-10-CM | POA: Diagnosis not present

## 2014-07-01 DIAGNOSIS — M25559 Pain in unspecified hip: Secondary | ICD-10-CM | POA: Insufficient documentation

## 2014-07-01 LAB — BASIC METABOLIC PANEL
ANION GAP: 7 (ref 5–15)
BUN: 8 mg/dL (ref 6–23)
CO2: 45 mmol/L — AB (ref 19–32)
CREATININE: 0.59 mg/dL (ref 0.50–1.10)
Calcium: 8.9 mg/dL (ref 8.4–10.5)
Chloride: 89 mmol/L — ABNORMAL LOW (ref 96–112)
GFR calc Af Amer: >90 mL/min (ref >90)
GFR calc non Af Amer: >90 mL/min (ref >90)
GLUCOSE: 113 mg/dL — AB (ref 70–99)
POTASSIUM: 3.7 mmol/L (ref 3.5–5.1)
SODIUM: 141 mmol/L (ref 135–145)

## 2014-07-01 LAB — CBC
HCT: 43 % (ref 36.0–46.0)
HEMOGLOBIN: 13.4 g/dL (ref 12.0–15.0)
MCH: 31.5 pg (ref 26.0–34.0)
MCHC: 31.2 g/dL (ref 30.0–36.0)
MCV: 101.2 fL — AB (ref 78.0–100.0)
PLATELETS: 141 10*3/uL — AB (ref 150–400)
RBC: 4.25 MIL/uL (ref 3.87–5.11)
RDW: 14 % (ref 11.5–15.5)
WBC: 9 10*3/uL (ref 4.0–10.5)

## 2014-07-01 MED ORDER — TECHNETIUM TC 99M SESTAMIBI GENERIC - CARDIOLITE
30.0000 | Freq: Once | INTRAVENOUS | Status: AC | PRN
  Administered 2014-07-01: 30 via INTRAVENOUS

## 2014-07-01 MED ORDER — TECHNETIUM TC 99M SESTAMIBI GENERIC - CARDIOLITE
10.0000 | Freq: Once | INTRAVENOUS | Status: AC | PRN
  Administered 2014-07-01: 10 via INTRAVENOUS

## 2014-07-01 MED ORDER — PANTOPRAZOLE SODIUM 40 MG PO TBEC
40.0000 mg | DELAYED_RELEASE_TABLET | Freq: Every day | ORAL | Status: DC
  Administered 2014-07-01: 40 mg via ORAL

## 2014-07-01 MED ORDER — REGADENOSON 0.4 MG/5ML IV SOLN
INTRAVENOUS | Status: AC
  Administered 2014-07-01: 0.4 mg via INTRAVENOUS
  Filled 2014-07-01: qty 5

## 2014-07-01 MED ORDER — NAPROXEN 500 MG PO TABS: 500.0000 mg | ORAL_TABLET | Freq: Two times a day (BID) | ORAL | Status: DC

## 2014-07-01 MED ORDER — PANTOPRAZOLE SODIUM 40 MG PO TBEC: 40.0000 mg | DELAYED_RELEASE_TABLET | Freq: Every day | ORAL | Status: DC

## 2014-07-01 MED ORDER — REGADENOSON 0.4 MG/5ML IV SOLN
0.4000 mg | Freq: Once | INTRAVENOUS | Status: AC
  Administered 2014-07-01: 0.4 mg via INTRAVENOUS
  Filled 2014-07-01: qty 5

## 2014-07-01 MED ORDER — NAPROXEN 500 MG PO TABS
500.0000 mg | ORAL_TABLET | Freq: Two times a day (BID) | ORAL | Status: DC
  Administered 2014-07-01: 500 mg via ORAL
  Filled 2014-07-01 (×2): qty 1

## 2014-07-01 NOTE — Progress Notes (Signed)
Patient ID: [name], female   DOB: 05-19-1955, 59 y.o.   MRN: [id number] Medicine attending: I examined this patient this morning together with resident physician Dr. Osa Craver and I concur with her evaluation and management plan. The patient had a myocardial perfusion study this morning which showed a normal left ventricular ejection fraction of 63% with normal wall motion, no ventricular dilatation, and no areas of ischemia or infarction. She continues to complain of left hip pain. 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 We will start her on a nonsteroidal. If symptoms persist then referral to sports medicine or orthopedics.

## 2014-07-01 NOTE — Progress Notes (Signed)
UR completed.

## 2014-07-01 NOTE — Progress Notes (Signed)
Subjective:  Patient was seen and examined this morning after myoview. She denies any complaints of chest pain, increased shortness of breath, nausea or vomiting. She does have chronic hip pain when she walks.    Objective: Vital signs in last 24 hours: Filed Vitals:   07/01/14 0927 07/01/14 0929 07/01/14 0931 07/01/14 0933  BP: 121/69 111/70 107/68 91/67  Pulse: 99 90 88 87  Temp:      TempSrc:      Resp:      Height:      Weight:      SpO2:       Weight change: -5 lb 12.3 oz (-2.617 kg)  Intake/Output Summary (Last 24 hours) at 07/01/14 1107 Last data filed at 06/30/14 2101  Gross per 24 hour  Intake    360 ml  Output    600 ml  Net   -240 ml   General: Vital signs reviewed.  Patient is obese, in no acute distress and cooperative with exam.  Cardiovascular: RRR Pulmonary/Chest: Clear to auscultation bilaterally, no wheezes, rales, or rhonchi.  Abdominal: Soft, non-tender, non-distended, BS + Extremities: 1+ pitting edema in right lower extremity. Trace pitting edema in left lower extremity. No calf tenderness. Neurological: A&O x3 Skin: Warm, dry and intact. No rashes or erythema. Psychiatric: Normal mood and affect. speech and behavior is normal. Cognition and memory are normal.   Lab Results: Basic Metabolic Panel:  Recent Labs Lab 06/29/14 1855 07/01/14 0345  NA 139 141  K 4.0 3.7  CL 91* 89*  CO2 39* 45*  GLUCOSE 102* 113*  BUN 8 8  CREATININE 0.76 0.59  CALCIUM 9.0 8.9   CBC:  Recent Labs Lab 06/30/14 0207 07/01/14 0345  WBC 8.4 9.0  HGB 13.5 13.4  HCT 44.2 43.0  MCV 101.6* 101.2*  PLT 145* 141*   Cardiac Enzymes:  Recent Labs Lab 06/30/14 0500 06/30/14 0858 06/30/14 1337  TROPONINI <0.03 <0.03 <0.03   Urine Drug Screen: Drugs of Abuse     Component Value Date/Time   LABOPIA NONE DETECTED 07/01/2013 1218   LABOPIA NEGATIVE 02/04/2009 2315   COCAINSCRNUR NONE DETECTED 07/01/2013 1218   COCAINSCRNUR NEGATIVE 02/04/2009 2315   LABBENZ NONE DETECTED 07/01/2013 1218   LABBENZ NEGATIVE 02/04/2009 2315   AMPHETMU NONE DETECTED 07/01/2013 1218   AMPHETMU NEGATIVE 02/04/2009 2315   THCU NONE DETECTED 07/01/2013 1218   LABBARB NONE DETECTED 07/01/2013 1218    Studies/Results: Dg Chest 2 View  06/29/2014   CLINICAL DATA:  59 year old female with shortness of breath and midsternal chest pain since yesterday.  EXAM: CHEST  2 VIEW  COMPARISON:  Chest x-ray 05/22/2014.  FINDINGS: There is cephalization of the pulmonary vasculature and slight indistinctness of the interstitial markings suggestive of mild pulmonary edema. No pleural effusions. Mild cardiomegaly. Upper mediastinal contours are within normal limits. Numerous surgical clips near the gastroesophageal junction.  IMPRESSION: 1. The appearance of the chest suggests mild congestive heart failure, as above.   Electronically Signed   By: Vinnie Langton M.D.   On: 06/29/2014 18:47   Dg Hip Unilat With Pelvis 2-3 Views Left  06/30/2014   CLINICAL DATA:  Patient status post fall 6-9 months ago. Left hip pain and bruising in the gluteal area. No numbness or tingling.  EXAM: LEFT HIP (WITH PELVIS) 2-3 VIEWS  COMPARISON:  None.  FINDINGS: Mild left hip joint degenerative changes. Normal anatomic alignment. No evidence for acute fracture or dislocation. Exam limited due to body habitus.  IMPRESSION: Exam limited due to body habitus.  No acute osseous abnormality.   Electronically Signed   By: Lovey Newcomer M.D.   On: 06/30/2014 12:04   Medications:  I have reviewed the patient's current medications. Prior to Admission:  Prescriptions prior to admission  Medication Sig Dispense Refill Last Dose  . albuterol (PROVENTIL HFA;VENTOLIN HFA) 108 (90 BASE) MCG/ACT inhaler Inhale 2 puffs into the lungs 3 (three) times daily as needed for wheezing or shortness of breath. 3.7 g 11 06/29/2014 at Unknown time  . albuterol (PROVENTIL) (2.5 MG/3ML) 0.083% nebulizer solution Take 2.5 mg by  nebulization daily as needed for wheezing or shortness of breath.   Past Week at Unknown time  . Aspirin-Salicylamide-Caffeine (BC HEADACHE POWDER PO) Take 1 packet by mouth every 6 (six) hours as needed (headache).   06/29/2014 at Unknown time  . Fluticasone-Salmeterol (ADVAIR) 250-50 MCG/DOSE AEPB Inhale 1 puff into the lungs 2 (two) times daily.   06/29/2014 at Unknown time  . furosemide (LASIX) 40 MG tablet Take 1 tablet (40 mg total) by mouth daily. Please note higher strength tablet.  Only take 1 tablet daily. 30 tablet 2 Past Week at Unknown time  . lovastatin (MEVACOR) 20 MG tablet Take 1 tablet (20 mg total) by mouth at bedtime. 90 tablet 3 Past Week at Unknown time  . SYMBICORT 160-4.5 MCG/ACT inhaler Inhale 1 puff into the lungs daily.  11 06/29/2014 at Unknown time  . tiotropium (SPIRIVA HANDIHALER) 18 MCG inhalation capsule Place 1 capsule (18 mcg total) into inhaler and inhale daily. 30 capsule 12 06/29/2014 at Unknown time  . albuterol (PROVENTIL HFA;VENTOLIN HFA) 108 (90 BASE) MCG/ACT inhaler Inhale 2 puffs into the lungs every 6 (six) hours as needed for wheezing or shortness of breath. 1 Inhaler 2   . aspirin 81 MG chewable tablet Chew 1 tablet (81 mg total) by mouth daily. 30 tablet 11   . diclofenac sodium (VOLTAREN) 1 % GEL Apply 2 g topically 4 (four) times daily. 1 Tube 0 Past Month at Unknown time  . polyethylene glycol (MIRALAX / GLYCOLAX) packet Take 17 g by mouth daily. 14 each 0   . senna-docusate (SENOKOT-S) 8.6-50 MG per tablet Take 2 tablets by mouth at bedtime. 60 tablet 0 Past Week at Unknown time   Scheduled Meds: . aspirin  81 mg Oral Daily  . enoxaparin (LOVENOX) injection  115 mg Subcutaneous Q12H  . furosemide  40 mg Oral Daily  . mometasone-formoterol  2 puff Inhalation BID  . polyethylene glycol  17 g Oral Daily  . pravastatin  40 mg Oral q1800  . senna-docusate  2 tablet Oral QHS  . tiotropium  18 mcg Inhalation Daily   Continuous Infusions:  PRN  Meds:.acetaminophen, albuterol, nitroGLYCERIN, ondansetron (ZOFRAN) IV Assessment/Plan: Principal Problem:   Chest pain Active Problems:   TOBACCO ABUSE   Coronary atherosclerosis   Severe chronic obstructive pulmonary disease   Hyperlipidemia   Acute on chronic diastolic CHF (congestive heart failure)  Unstable Angina: Troponins were negative yesterday. Patient went for a myoview this morning for which results are still pending. If normal, patient can possibly be discharged home today. We will speak with Cardiology concerning their recommendations.   -Consult to Cardiology, appreciate recommendations -Monitor on telemetry -Continue aspirin 81 mg daily -Continue nitroglycerin sublingual prn for chest pain -Continue pravastatin 40 mg daily  Severe COPD [home 4 L O2]: FEV1/FVC 61%, FEV1 37% predicted with no improvement on bronchodilators though suggestive of severe airway obstruction  as noted on PFTs 12/29/2008. Patient is on albuterol, Advair, Spiriva at home along with 4 L of oxygen. Patient is satting 90-92% on 4 L [place]. CO2 this morning was high at 45 likely secondary to a chronic respiratory acidosis from COPD. Patient was not in any distress nor wheezing. She felt comfortable. -Continue albuterol Q6H prn  -Continue Dulera 2 puffs BID -Continue Spiriva daily  Diastolic CHF: EF 77-37% with grade 1 diastolic dysfunction as noted on echo 05/25/14. Patient had not taken lasix in 3 days due to feeling unwell. BNP elevated at 195.1, but lower than previous BNP 536.0 on 05/22/14. 1-2+ piitting edema in right lower extremity, trace in left. No crackles on examination.  -Continue Lasix 40 mg daily  Chronic Left Hip Osteoarthritis: Left hip xray was limited due to body habitus, but showed left hip degenerative changes but no acute osseous abnormality. -Follow up as outpatient -Consider naproxen 500 mg BID on discharge  Tobacco abuse: Patient continues to smoke 1/2 ppd.  -Continue smoking  cessation counseling  FEN:  -Diet: Heart healthy  DVT prophylaxis: Lovenox SQ QD  CODE STATUS: FULL CODE -Defer to daughter Nira Conn [phone] if patients lacks decision-making capacity -Confirmed with patient on admission who also reports that she would want to be intubated for no more than 2 weeks.  Dispo: Disposition is deferred at this time, awaiting improvement of current medical problems.  Anticipated discharge in approximately 1-2 day(s).   The patient does have a current PCP Oval Linsey, MD) and does need an [facility] hospital follow-up appointment after discharge.  The patient does not have transportation limitations that hinder transportation to clinic appointments.  .Services Needed at time of discharge: Y = Yes, Blank = No PT:   OT:   RN:   Equipment:   Other:     LOS: 2 days   Osa Craver, DO PGY-1 Internal Medicine Resident Pager # [phone] 07/01/2014 11:07 AM

## 2014-07-01 NOTE — Discharge Instructions (Signed)
Thank you for allowing [place] to be involved in your healthcare while you were hospitalized at [facility].   Please note that there have been changes to your home medications.  --> PLEASE LOOK AT YOUR DISCHARGE MEDICATION LIST FOR DETAILS.  Please call your PCP if you have any questions or concerns, or any difficulty getting any of your medications.  Please return to the ER if you have worsening of your symptoms or new severe symptoms arise.  Your stress test was normal. Please follow up with Cardiology. They will call you with an appointment.   Please follow up with the Internal Medicine Clinic as listed.  Please try naproxen 500 mg twice a day WITH MEALS for your back pain. Also take protonix 40 mg daily to protect your stomach and prevent reflux.  Osteoarthritis Osteoarthritis is a disease that causes soreness and inflammation of a joint. It occurs when the cartilage at the affected joint wears down. Cartilage acts as a cushion, covering the ends of bones where they meet to form a joint. Osteoarthritis is the most common form of arthritis. It often occurs in older people. The joints affected most often by this condition include those in the:  Ends of the fingers.  Thumbs.  Neck.  Lower back.  Knees.  Hips. CAUSES  Over time, the cartilage that covers the ends of bones begins to wear away. This causes bone to rub on bone, producing pain and stiffness in the affected joints.  RISK FACTORS Certain factors can increase your chances of having osteoarthritis, including:  Older age.  Excessive body weight.  Overuse of joints.  Previous joint injury. SIGNS AND SYMPTOMS   Pain, swelling, and stiffness in the joint.  Over time, the joint may lose its normal shape.  Small deposits of bone (osteophytes) may grow on the edges of the joint.  Bits of bone or cartilage can break off and float inside the joint space. This may cause more pain and damage. DIAGNOSIS    Your health care provider will do a physical exam and ask about your symptoms. Various tests may be ordered, such as:  X-rays of the affected joint.  An MRI scan.  Blood tests to rule out other types of arthritis.  Joint fluid tests. This involves using a needle to draw fluid from the joint and examining the fluid under a microscope. TREATMENT  Goals of treatment are to control pain and improve joint function. Treatment plans may include:  A prescribed exercise program that allows for rest and joint relief.  A weight control plan.  Pain relief techniques, such as:  Properly applied heat and cold.  Electric pulses delivered to nerve endings under the skin (transcutaneous electrical nerve stimulation [TENS]).  Massage.  Certain nutritional supplements.  Medicines to control pain, such as:  Acetaminophen.  Nonsteroidal anti-inflammatory drugs (NSAIDs), such as naproxen.  Narcotic or central-acting agents, such as tramadol.  Corticosteroids. These can be given orally or as an injection.  Surgery to reposition the bones and relieve pain (osteotomy) or to remove loose pieces of bone and cartilage. Joint replacement may be needed in advanced states of osteoarthritis. HOME CARE INSTRUCTIONS   Take medicines only as directed by your health care provider.  Maintain a healthy weight. Follow your health care provider's instructions for weight control. This may include dietary instructions.  Exercise as directed. Your health care provider can recommend specific types of exercise. These may include:  Strengthening exercises. These are done to strengthen  the muscles that support joints affected by arthritis. They can be performed with weights or with exercise bands to add resistance.  Aerobic activities. These are exercises, such as brisk walking or low-impact aerobics, that get your heart pumping.  Range-of-motion activities. These keep your joints limber.  Balance and agility  exercises. These help you maintain daily living skills.  Rest your affected joints as directed by your health care provider.  Keep all follow-up visits as directed by your health care provider. SEEK MEDICAL CARE IF:   Your skin turns red.  You develop a rash in addition to your joint pain.  You have worsening joint pain.  You have a fever along with joint or muscle aches. SEEK IMMEDIATE MEDICAL CARE IF:  You have a significant loss of weight or appetite.  You have night sweats. [place] of Arthritis and Musculoskeletal and Skin Diseases: www.niams.SouthExposed.es  [employer] on Aging: http://kim-miller.com/  American College of Rheumatology: www.rheumatology.org Document Released: 03/21/2005 Document Revised: 08/05/2013 Document Reviewed: 11/26/2012 [facility] Patient Information 2015 [place], [place]. This information is not intended to replace advice given to you by your health care provider. Make sure you discuss any questions you have with your health care provider.

## 2014-07-01 NOTE — Care Management Note (Signed)
    Page 1 of 1   07/01/2014     3:39:05 PM CARE MANAGEMENT NOTE 07/01/2014  Patient:  [name], [name]   Account Number:  [id number]  Date Initiated:  07/01/2014  Documentation initiated by:  Mehar Kirkwood  Subjective/Objective Assessment:   Pt adm on 06/29/14 with SOB, CP.  PTA, pt resides at home with family and is independent.  She is on home oxygen.     Action/Plan:   No dc needs identified.  Pt follows up at Internal Medicine Clinic.   Anticipated DC Date:  07/01/2014   Anticipated DC Plan:  [place]  CM consult      Choice offered to / List presented to:             Status of service:  Completed, signed off Medicare Important Message given?  NO (If response is "NO", the following Medicare IM given date fields will be blank) Date Medicare IM given:   Medicare IM given by:   Date Additional Medicare IM given:   Additional Medicare IM given by:    Discharge Disposition:  HOME/SELF CARE  Per UR Regulation:  Reviewed for med. necessity/level of care/duration of stay  If discussed at [place] of Stay Meetings, dates discussed:    Comments:

## 2014-07-01 NOTE — Discharge Summary (Signed)
Name: [name] MRN: [id number] DOB: 29-Nov-1955 59 y.o. PCP: Oval Linsey, MD  Date of Admission: 06/29/2014  6:07 PM Date of Discharge: 07/01/14 Attending Physician: Dr. Beryle Beams  Discharge Diagnosis:  Principal Problem:   Chest pain Active Problems:   TOBACCO ABUSE   Coronary atherosclerosis   Severe chronic obstructive pulmonary disease   Hyperlipidemia   Acute on chronic diastolic CHF (congestive heart failure)   Hip pain  Discharge Medications:   Medication List    STOP taking these medications        BC HEADACHE POWDER PO      TAKE these medications        albuterol (2.5 MG/3ML) 0.083% nebulizer solution  Commonly known as:  PROVENTIL  Take 2.5 mg by nebulization daily as needed for wheezing or shortness of breath.     albuterol 108 (90 BASE) MCG/ACT inhaler  Commonly known as:  PROVENTIL HFA;VENTOLIN HFA  Inhale 2 puffs into the lungs every 6 (six) hours as needed for wheezing or shortness of breath.     aspirin 81 MG chewable tablet  Chew 1 tablet (81 mg total) by mouth daily.     diclofenac sodium 1 % Gel  Commonly known as:  VOLTAREN  Apply 2 g topically 4 (four) times daily.     Fluticasone-Salmeterol 250-50 MCG/DOSE Aepb  Commonly known as:  ADVAIR  Inhale 1 puff into the lungs 2 (two) times daily.     furosemide 40 MG tablet  Commonly known as:  LASIX  Take 1 tablet (40 mg total) by mouth daily. Please note higher strength tablet.  Only take 1 tablet daily.     lovastatin 20 MG tablet  Commonly known as:  MEVACOR  Take 1 tablet (20 mg total) by mouth at bedtime.     naproxen 500 MG tablet  Commonly known as:  NAPROSYN  Take 1 tablet (500 mg total) by mouth 2 (two) times daily with a meal.     pantoprazole 40 MG tablet  Commonly known as:  PROTONIX  Take 1 tablet (40 mg total) by mouth daily.     polyethylene glycol packet  Commonly known as:  MIRALAX / GLYCOLAX  Take 17 g by mouth daily.     senna-docusate 8.6-50 MG per  tablet  Commonly known as:  Senokot-S  Take 2 tablets by mouth at bedtime.     SYMBICORT 160-4.5 MCG/ACT inhaler  Generic drug:  budesonide-formoterol  Inhale 1 puff into the lungs daily.     tiotropium 18 MCG inhalation capsule  Commonly known as:  SPIRIVA HANDIHALER  Place 1 capsule (18 mcg total) into inhaler and inhale daily.       Disposition and follow-up:   [name] was discharged from [facility] in Good condition.  At the hospital follow up visit please address:  1.  Chest Pain: Please assess any recurrence. Please discuss with patient increasing her to a moderate intensity statin like pravastatin 40 mg daily.   Left Hip Osteoarthritis: Please assess improvement of pain on Naproxen 500 mg BID.  2.  Labs / imaging needed at time of follow-up: None  3.  Pending labs/ test needing follow-up: None  Follow-up Appointments: Follow-up Information    Follow up with Duwaine Maxin, DO On 07/15/2014.   Specialty:  Internal Medicine   Why:  10:45 am   Contact information:   [place] [address] [phone]       Follow up  with Minus Breeding, MD.   Specialty:  Cardiology   Why:  The office will call.Appt. on April 27 _0 :30 with Cecilie Kicks please arrive 15 minutes early   Contact information:   [place] [address] [phone]       Discharge Instructions: Discharge Instructions    Diet - low sodium heart healthy    Complete by:  As directed      Increase activity slowly    Complete by:  As directed            Consultations: Treatment Team:  Rounding Lbcardiology, MD  Procedures Performed:  Dg Chest 2 View  06/29/2014   CLINICAL DATA:  59 year old female with shortness of breath and midsternal chest pain since yesterday.  EXAM: CHEST  2 VIEW  COMPARISON:  Chest x-ray 05/22/2014.  FINDINGS: There is cephalization of the pulmonary vasculature and slight indistinctness of the interstitial markings  suggestive of mild pulmonary edema. No pleural effusions. Mild cardiomegaly. Upper mediastinal contours are within normal limits. Numerous surgical clips near the gastroesophageal junction.  IMPRESSION: 1. The appearance of the chest suggests mild congestive heart failure, as above.   Electronically Signed   By: Vinnie Langton M.D.   On: 06/29/2014 18:47   Nm Myocar Multi W/spect W/wall Motion / Ef  07/01/2014   CLINICAL DATA:  Acute chest pain  EXAM: MYOCARDIAL IMAGING WITH SPECT (REST AND PHARMACOLOGIC-STRESS)  GATED LEFT VENTRICULAR WALL MOTION STUDY  LEFT VENTRICULAR EJECTION FRACTION  TECHNIQUE: Standard myocardial SPECT imaging was performed after resting intravenous injection of 10 mCi Tc-78msestamibi. Subsequently, intravenous infusion of Lexiscan was performed under the supervision of the Cardiology staff. At peak effect of the drug, 30 mCi [id number] was injected intravenously and standard myocardial SPECT imaging was performed. Quantitative gated imaging was also performed to evaluate left ventricular wall motion, and estimate left ventricular ejection fraction.  COMPARISON:  None.  FINDINGS: Perfusion: Allowing for attenuation artifact of the inferior wall and septum, No definite decreased activity in the left ventricle on stress imaging to suggest reversible ischemia or infarction.  Wall Motion: Normal left ventricular wall motion. No left ventricular dilation.  Left Ventricular Ejection Fraction: 63 %  End diastolic volume 98 ml  End systolic volume 37 ml  IMPRESSION: 1. No reversible ischemia or infarction.  2. Normal left ventricular wall motion.  3. Left ventricular ejection fraction 63%  4. Low-risk stress test findings*.  *2012 Appropriate Use Criteria for Coronary Revascularization Focused Update: J Am Coll Cardiol. 207858;85(0):277-412http://content.onairportbarriers.comspx?articleid=1201161   Electronically Signed   By: M.Jerilynn Mages Shick M.D.   On: 07/01/2014 11:27   Dg Hip Unilat With  Pelvis 2-3 Views Left  06/30/2014   CLINICAL DATA:  Patient status post fall 6-9 months ago. Left hip pain and bruising in the gluteal area. No numbness or tingling.  EXAM: LEFT HIP (WITH PELVIS) 2-3 VIEWS  COMPARISON:  None.  FINDINGS: Mild left hip joint degenerative changes. Normal anatomic alignment. No evidence for acute fracture or dislocation. Exam limited due to body habitus.  IMPRESSION: Exam limited due to body habitus.  No acute osseous abnormality.   Electronically Signed   By: DrLovey Newcomer.D.   On: 06/30/2014 12:04    2D Echo:  Study Conclusions  - Left ventricle: The cavity size was normal. Wall thickness was increased in a pattern of mild LVH. Systolic function was normal. The estimated ejection fraction was in the range of 50% to 55%. Regional wall motion  abnormalities cannot be excluded. Doppler parameters are consistent with abnormal left ventricular relaxation (grade 1 diastolic dysfunction). - Aortic valve: Valve area (Vmax): 2.59 cm^2. - Left atrium: The atrium was mildly dilated. - Right atrium: The atrium was mildly dilated. - Pericardium, extracardiac: A trivial pericardial effusion was identified posterior to the heart.  Admission HPI: Ms. Glaza is a 59 year old female with severe COPD [home 4 L Z6], diastolic CHF, coronary artery disease status post CABG, tobacco abuse who presents with chest pain.  Yesterday, she reports the onset of midsternal chest pain while resting at home. She describes the sensation as "something sitting on my chest." Pain does not radiate or extend anywhere, and she has never had this sensation before. She does not recall any alleviating or exacerbating factors. Of note, she has not taken her Lasix the last 3 days as she reports feeling dizzy and dry. Other than some baseline abdominal pain from her constipation, she denies any nausea, vomiting, diarrhea, fever, sick contacts, wheezing, falls, increased oxygen requirement, missing  any of her other medications. She has a history of being hospitalized for acute respiratory failure, usually in the setting of acute COPD exacerbation with or without acute heart failure exacerbation, most recently last month. She does report having received the flu vaccine this past year. She was at home with her daughter and has smoked half pack per day since the age of 7 that denies any recent alcohol or illicit drug use. In the emergency department, her pain improved with sublingual nitroglycerin and morphine 4 mg IV. She also received aspirin 160 mg, Lasix 40 mg IV, and was started on heparin infusion for unstable angina.  Hospital Course by problem list: Principal Problem:   Chest pain Active Problems:   TOBACCO ABUSE   Coronary atherosclerosis   Severe chronic obstructive pulmonary disease   Hyperlipidemia   Acute on chronic diastolic CHF (congestive heart failure)   Hip pain   Chest Pain: Patient presented with chest pain as described above. Symptoms appeared consistent with a cardiac etiology given its nonreproducibility, description, location. HEART score 6, which puts her at moderate risk for major adverse cardiac events. She also had risk factors given her obesity, prior history of cardiac disease, active smoking habit, lung disease, family history [mother died of MI in 60s, father died of MI in 74s]. Physical exam findings did not appear consistent with worsening of her lung disease given absence of wheezing. She underwent placement of drug-eluting stent in the proximal LAD after is found to be 90% stenosed on 12/12/2008; also notable was an occluded right coronary artery. Troponins were negative yesterday. Patient went for a myoview which revealed no reversible ischemia or infarction, normal left ventricular wall motion, left ventricular ejection fraction 63%, which revealed low-risk stress test findings. Patient was discharged on aspirin 81 mg daily and lovastatin 20 mg daily. Patient was  managed on pravastatin 40 mg daily during admission. Please discuss with patient increasing her to a moderate intensity statin like pravastatin 40 mg daily.  Severe COPD [home 4 L O2]: Previous PFTs revealed FEV1/FVC 61%, FEV1 37% predicted with no improvement on bronchodilators though suggestive of severe airway obstruction as noted on PFTs 12/29/2008. Patient is on albuterol, Advair, Spiriva at home along with 4 L of oxygen. Patient was satting 90-94% on 4 L [place]. Patient was continued on her home medications.  Diastolic CHF: EF 10-96% with grade 1 diastolic dysfunction as noted on echo 05/25/14. Patient had not taken lasix in 3 days  due to feeling unwell. BNP elevated at 195.1, but lower than previous BNP 536.0 on 05/22/14. 1-2+ piitting edema in right lower extremity, trace in left. No crackles on examination. Patient was continued on her home Lasix 40 mg daily.  Chronic Left Hip Osteoarthritis: Patient complained of chronic left hip pain. Left hip xray was limited due to body habitus, but showed left hip degenerative changes but no acute osseous abnormality. Patient was discharged on naproxen 500 mg BID on discharge. Please assess pain control on follow up.  Tobacco abuse: Patient continues to smoke 1/2 ppd. Patient recieved smoking cessation counseling.  Discharge Vitals:   BP 91/67 mmHg  Pulse 87  Temp(Src) 97.5 F (36.4 C) (Oral)  Resp 17  Ht _0  (1.651 m)  Wt 248 lb 3.2 oz (112.583 kg)  BMI 41.30 kg/m2  SpO2 91%  Signed: Osa Craver, DO PGY-1 Internal Medicine Resident Pager # [phone] 07/03/2014 8:17 PM

## 2014-07-01 NOTE — Progress Notes (Signed)
CRITICAL VALUE ALERT  Critical value received:  CO2=45  Date of notification:  07/01/2014  Time of notification:  0654  Critical value read back:yes  Nurse who received alert:  Milus Height  MD notified (1st page):  Dr. Marvel Plan  Time of first page:  [phone]  MD notified (2nd page):  Time of second page:  Responding MD:  Dr. Marvel Plan  Time MD responded:  [phone]

## 2014-07-01 NOTE — Progress Notes (Signed)
Lexiscan MV performed. Same day study.  Rosaria Ferries, PA-C 07/01/2014 9:37 AM Beeper [phone]

## 2014-07-01 NOTE — Progress Notes (Signed)
Patient Name: [name] Date of Encounter: 07/01/2014  Principal Problem:   Chest pain Active Problems:   TOBACCO ABUSE   Coronary atherosclerosis   Severe chronic obstructive pulmonary disease   Hyperlipidemia   Acute on chronic diastolic CHF (congestive heart failure)   Primary Cardiologist: Dr Percival Spanish  Patient Profile: 59 yo female w/ hx COPD on O2, LAD PCI w/ RCA 100% 2010, PAH, borderline DM, tob use, D-CHF, who was admitted 03/27 w/ chest pain.  SUBJECTIVE: No more chest pain, was a little SOB, got a neb and got better.  OBJECTIVE Filed Vitals:   06/30/14 1855 06/30/14 2103 07/01/14 0448 07/01/14 0843  BP: 99/47 95/65 116/60 115/62  Pulse: 80 80 81 75  Temp: 98.2 F (36.8 C) 98.5 F (36.9 C) 97.5 F (36.4 C)   TempSrc: Oral Oral Oral   Resp: _0 Height:      Weight:   248 lb 3.2 oz (112.583 kg)   SpO2: 93% 92% 91%     Intake/Output Summary (Last 24 hours) at 07/01/14 0034 Last data filed at 06/30/14 2101  Gross per 24 hour  Intake    580 ml  Output    600 ml  Net    -20 ml   Filed Weights   06/29/14 2333 07/01/14 0448  Weight: 253 lb 15.5 oz (115.2 kg) 248 lb 3.2 oz (112.583 kg)    PHYSICAL EXAM General: Well developed, well nourished, female in no acute distress. Head: Normocephalic, atraumatic.  Neck: Supple without bruits, JVD not seen elevated, difficult to assess 2nd body habitus. Lungs:  Resp regular and unlabored, rales bases. Heart: RRR, S1, S2, no S3, S4, or murmur; no rub. Abdomen: Soft, non-tender, non-distended, BS + x 4.  Extremities: No clubbing, cyanosis, no edema.  Neuro: Alert and oriented X 3. Moves all extremities spontaneously. Psych: Normal affect.  LABS: CBC: Recent Labs  06/30/14 0207 07/01/14 0345  WBC 8.4 9.0  HGB 13.5 13.4  HCT 44.2 43.0  MCV 101.6* 101.2*  PLT 145* 917*   Basic Metabolic Panel: Recent Labs  06/29/14 1855 07/01/14 0345  NA 139 141  K 4.0 3.7  CL 91* 89*  CO2 39* 45*    GLUCOSE 102* 113*  BUN 8 8  CREATININE 0.76 0.59  CALCIUM 9.0 8.9   Cardiac Enzymes: Recent Labs  06/30/14 0500 06/30/14 0858 06/30/14 1337  TROPONINI <0.03 <0.03 <0.03    Recent Labs  06/29/14 1904  TROPIPOC 0.01   BNP:  B NATRIURETIC PEPTIDE  Date/Time Value Ref Range Status  06/29/2014 06:55 PM 195.1* 0.0 - 100.0 pg/mL Final  05/22/2014 06:49 PM 536.0* 0.0 - 100.0 pg/mL Final   TELE: SR, seen in nuc med  Radiology/Studies: Dg Chest 2 View 06/29/2014   CLINICAL DATA:  59 year old female with shortness of breath and midsternal chest pain since yesterday.  EXAM: CHEST  2 VIEW  COMPARISON:  Chest x-ray 05/22/2014.  FINDINGS: There is cephalization of the pulmonary vasculature and slight indistinctness of the interstitial markings suggestive of mild pulmonary edema. No pleural effusions. Mild cardiomegaly. Upper mediastinal contours are within normal limits. Numerous surgical clips near the gastroesophageal junction.  IMPRESSION: 1. The appearance of the chest suggests mild congestive heart failure, as above.   Electronically Signed   By: Vinnie Langton M.D.   On: 06/29/2014 18:47   Dg Hip Unilat With Pelvis 2-3 Views Left 06/30/2014   CLINICAL DATA:  Patient status post fall 6-9 months  ago. Left hip pain and bruising in the gluteal area. No numbness or tingling.  EXAM: LEFT HIP (WITH PELVIS) 2-3 VIEWS  COMPARISON:  None.  FINDINGS: Mild left hip joint degenerative changes. Normal anatomic alignment. No evidence for acute fracture or dislocation. Exam limited due to body habitus.  IMPRESSION: Exam limited due to body habitus.  No acute osseous abnormality.   Electronically Signed   By: Lovey Newcomer M.D.   On: 06/30/2014 12:04   Current Medications:  . aspirin  81 mg Oral Daily  . enoxaparin (LOVENOX) injection  115 mg Subcutaneous Q12H  . furosemide  40 mg Oral Daily  . mometasone-formoterol  2 puff Inhalation BID  . polyethylene glycol  17 g Oral Daily  . pravastatin  40 mg  Oral q1800  . senna-docusate  2 tablet Oral QHS  . tiotropium  18 mcg Inhalation Daily   ASSESSMENT AND PLAN: Principal Problem:   Chest pain - ez neg MI, for MV today to evaluate for ischemia - known RCA occlusion, treated medically - pt does not routinely get chest pain, this was unusual - on ASA, statin  Active Problems:   TOBACCO ABUSE - per IM, cessation encouraged    Coronary atherosclerosis - see above    Severe chronic obstructive pulmonary disease - per IM    Hyperlipidemia - on statin, don't see any recent lipid profile - will leave to IM    Acute on chronic diastolic CHF (congestive heart failure) - Pt had 40 mg IV Lasix x 1 yesterday - wt down 5 lbs and no JVD seen - agree w/ po Lasix  Otherwise, per IM, will f/u on results.  Signed, Rosaria Ferries , PA-C 9:21 AM 07/01/2014  The nuclear study shows no ischemia. OK for patient to be discharged.  Daryel November, MD

## 2014-07-01 NOTE — Progress Notes (Signed)
1758 discharge instructions given and verbalized understanding Whheled in lobby by NT with portable O2  On by 

## 2014-07-03 DIAGNOSIS — R079 Chest pain, unspecified: Secondary | ICD-10-CM | POA: Diagnosis not present

## 2014-07-03 DIAGNOSIS — I200 Unstable angina: Secondary | ICD-10-CM | POA: Diagnosis not present

## 2014-07-03 DIAGNOSIS — F17200 Nicotine dependence, unspecified, uncomplicated: Secondary | ICD-10-CM | POA: Diagnosis not present

## 2014-07-03 DIAGNOSIS — R0789 Other chest pain: Secondary | ICD-10-CM | POA: Diagnosis not present

## 2014-07-03 DIAGNOSIS — J449 Chronic obstructive pulmonary disease, unspecified: Secondary | ICD-10-CM | POA: Diagnosis not present

## 2014-07-03 NOTE — Progress Notes (Signed)
Patient ID: [name], female   DOB: 31-Mar-1956, 59 y.o.   MRN: [id number] Medicine attending discharge note: I personally examined this patient on the day of discharge together with resident physician Dr. Osa Craver and I attest to the accuracy of her discharge evaluation and plan as recorded in her progress note dated 07/01/2014.  Clinical summary: 59 year old obese woman with oxygen-dependent obstructive airway disease who continues to smoke, coronary artery disease status post placement of a drug eluting stent to the LAD in 2010, right coronary artery found to be occluded at that time. She gives no history of prior MI, no history of coronary artery bypass surgery despite some notes to the contrary recorded by other examiners in her chart.. Echocardiogram done 05/25/2014 with ejection fraction 50-55 percent, grade 1 diastolic dysfunction, no major valve abnormalities. Increased ventricular wall thickness compatible with mild left ventricular hypertrophy. Regional wall motion abnormalities could not be excluded. She presented at the current time with sudden onset of nonradiating midsternal chest pressure while at rest; no associated nausea. Pain improved with administration of sublingual nitroglycerin and IV morphine in the emergency department. Aspirin and heparin were initiated. No acute ischemic changes on initial cardiogram which showed sinus rhythm, right ventricular conduction delay, right axis deviation, and nonspecific ST/T-wave changes. On initial exam blood pressure 128/74, pulse 90 regular, temperature 98.3, respirations 18, oxygen saturation 91%. Regular cardiac rhythm without murmur or gallop. Diffuse decreased breath sounds over the lung fields with poor air movement. 1+ peripheral edema.  Hospital course: Serial troponins and electrocardiograms showed no changes. However due to the ischemic quality of the chest pain in a lady with known underlying cardiopulmonary disease who had not  been reevaluated since December 2010, we asked cardiology to consult on the patient. They did feel that a follow-up Myoview study was indicated. This was done on March 29 which showed a normal left ventricular ejection fraction 63%, normal wall motion, no ventricular dilatation, no areas of ischemia or infarction. Her chest symptoms subsided spontaneously. Her main complaint was actually left hip pain. X-rays showed degenerative changes not unexpected in this obese woman. No other acute issues identified. She was discharged in stable condition. She will follow-up in our outpatient internal medicine clinic. She will continue her outpatient medications including aspirin and a statin. There were no complications.

## 2014-07-06 ENCOUNTER — Other Ambulatory Visit: Payer: Self-pay | Admitting: Internal Medicine

## 2014-07-06 DIAGNOSIS — R0902 Hypoxemia: Secondary | ICD-10-CM | POA: Diagnosis not present

## 2014-07-06 DIAGNOSIS — J449 Chronic obstructive pulmonary disease, unspecified: Secondary | ICD-10-CM | POA: Diagnosis not present

## 2014-07-06 DIAGNOSIS — J9600 Acute respiratory failure, unspecified whether with hypoxia or hypercapnia: Secondary | ICD-10-CM | POA: Diagnosis not present

## 2014-07-15 ENCOUNTER — Observation Stay (HOSPITAL_COMMUNITY)
Admission: EM | Admit: 2014-07-15 | Discharge: 2014-07-16 | Disposition: A | Discharge status: Home / Self Care | Payer: Commercial Managed Care - HMO | Attending: Internal Medicine | Admitting: Internal Medicine

## 2014-07-15 ENCOUNTER — Emergency Department (HOSPITAL_COMMUNITY): Payer: Commercial Managed Care - HMO

## 2014-07-15 ENCOUNTER — Encounter: Payer: Medicare HMO | Admitting: Internal Medicine

## 2014-07-15 ENCOUNTER — Encounter (HOSPITAL_COMMUNITY): Payer: Self-pay | Admitting: Emergency Medicine

## 2014-07-15 DIAGNOSIS — G4733 Obstructive sleep apnea (adult) (pediatric): Secondary | ICD-10-CM | POA: Diagnosis present

## 2014-07-15 DIAGNOSIS — J441 Chronic obstructive pulmonary disease with (acute) exacerbation: Secondary | ICD-10-CM | POA: Diagnosis not present

## 2014-07-15 DIAGNOSIS — Z8614 Personal history of Methicillin resistant Staphylococcus aureus infection: Secondary | ICD-10-CM | POA: Insufficient documentation

## 2014-07-15 DIAGNOSIS — Z720 Tobacco use: Secondary | ICD-10-CM | POA: Diagnosis not present

## 2014-07-15 DIAGNOSIS — E669 Obesity, unspecified: Secondary | ICD-10-CM | POA: Insufficient documentation

## 2014-07-15 DIAGNOSIS — K219 Gastro-esophageal reflux disease without esophagitis: Secondary | ICD-10-CM | POA: Insufficient documentation

## 2014-07-15 DIAGNOSIS — R05 Cough: Secondary | ICD-10-CM | POA: Diagnosis not present

## 2014-07-15 DIAGNOSIS — I5030 Unspecified diastolic (congestive) heart failure: Secondary | ICD-10-CM | POA: Diagnosis present

## 2014-07-15 DIAGNOSIS — Z7982 Long term (current) use of aspirin: Secondary | ICD-10-CM | POA: Diagnosis not present

## 2014-07-15 DIAGNOSIS — I2510 Atherosclerotic heart disease of native coronary artery without angina pectoris: Secondary | ICD-10-CM | POA: Insufficient documentation

## 2014-07-15 DIAGNOSIS — Z79899 Other long term (current) drug therapy: Secondary | ICD-10-CM | POA: Insufficient documentation

## 2014-07-15 DIAGNOSIS — E785 Hyperlipidemia, unspecified: Secondary | ICD-10-CM | POA: Diagnosis not present

## 2014-07-15 DIAGNOSIS — R0602 Shortness of breath: Secondary | ICD-10-CM | POA: Diagnosis not present

## 2014-07-15 DIAGNOSIS — Z9981 Dependence on supplemental oxygen: Secondary | ICD-10-CM | POA: Insufficient documentation

## 2014-07-15 DIAGNOSIS — R0789 Other chest pain: Secondary | ICD-10-CM | POA: Diagnosis not present

## 2014-07-15 DIAGNOSIS — F17210 Nicotine dependence, cigarettes, uncomplicated: Secondary | ICD-10-CM | POA: Diagnosis not present

## 2014-07-15 DIAGNOSIS — Z7951 Long term (current) use of inhaled steroids: Secondary | ICD-10-CM | POA: Insufficient documentation

## 2014-07-15 DIAGNOSIS — I509 Heart failure, unspecified: Secondary | ICD-10-CM | POA: Insufficient documentation

## 2014-07-15 DIAGNOSIS — J449 Chronic obstructive pulmonary disease, unspecified: Secondary | ICD-10-CM

## 2014-07-15 HISTORY — DX: Reserved for inherently not codable concepts without codable children: IMO0001

## 2014-07-15 LAB — CBC WITH DIFFERENTIAL/PLATELET
BASOS ABS: 0 10*3/uL (ref 0.0–0.1)
BASOS PCT: 0 % (ref 0–1)
EOS ABS: 0.2 10*3/uL (ref 0.0–0.7)
EOS PCT: 2 % (ref 0–5)
HCT: 41.8 % (ref 36.0–46.0)
HEMOGLOBIN: 13.1 g/dL (ref 12.0–15.0)
LYMPHS ABS: 0.9 10*3/uL (ref 0.7–4.0)
LYMPHS PCT: 11 % — AB (ref 12–46)
MCH: 31.3 pg (ref 26.0–34.0)
MCHC: 31.3 g/dL (ref 30.0–36.0)
MCV: 99.8 fL (ref 78.0–100.0)
MONO ABS: 0.9 10*3/uL (ref 0.1–1.0)
MONOS PCT: 11 % (ref 3–12)
NEUTROS ABS: 5.9 10*3/uL (ref 1.7–7.7)
Neutrophils Relative %: 76 % (ref 43–77)
Platelets: 143 10*3/uL — ABNORMAL LOW (ref 150–400)
RBC: 4.19 MIL/uL (ref 3.87–5.11)
RDW: 14 % (ref 11.5–15.5)
WBC: 7.8 10*3/uL (ref 4.0–10.5)

## 2014-07-15 LAB — BASIC METABOLIC PANEL
Anion gap: 6 (ref 5–15)
BUN: 9 mg/dL (ref 6–23)
CALCIUM: 8.9 mg/dL (ref 8.4–10.5)
CO2: 43 mmol/L (ref 19–32)
CREATININE: 0.83 mg/dL (ref 0.50–1.10)
Chloride: 92 mmol/L — ABNORMAL LOW (ref 96–112)
GFR, EST AFRICAN AMERICAN: 87 mL/min — AB (ref >90)
GFR, EST NON AFRICAN AMERICAN: 75 mL/min — AB (ref >90)
Glucose, Bld: 172 mg/dL — ABNORMAL HIGH (ref 70–99)
Potassium: 4.2 mmol/L (ref 3.5–5.1)
Sodium: 141 mmol/L (ref 135–145)

## 2014-07-15 LAB — I-STAT VENOUS BLOOD GAS, ED
Acid-Base Excess: 13 mmol/L — ABNORMAL HIGH (ref 0.0–2.0)
Bicarbonate: 42.7 mEq/L — ABNORMAL HIGH (ref 20.0–24.0)
O2 Saturation: 88 %
PCO2 VEN: 77.3 mmHg — AB (ref 45.0–50.0)
PO2 VEN: 61 mmHg — AB (ref 30.0–45.0)
TCO2: 45 mmol/L (ref 0–100)
pH, Ven: 7.35 — ABNORMAL HIGH (ref 7.250–7.300)

## 2014-07-15 LAB — BRAIN NATRIURETIC PEPTIDE: B NATRIURETIC PEPTIDE 5: 499.2 pg/mL — AB (ref 0.0–100.0)

## 2014-07-15 LAB — I-STAT TROPONIN, ED: Troponin i, poc: 0.02 ng/mL (ref 0.00–0.08)

## 2014-07-15 MED ORDER — LEVOFLOXACIN 750 MG PO TABS
750.0000 mg | ORAL_TABLET | Freq: Every day | ORAL | Status: DC
  Administered 2014-07-15 – 2014-07-16 (×2): 750 mg via ORAL
  Filled 2014-07-15 (×2): qty 1

## 2014-07-15 MED ORDER — IPRATROPIUM-ALBUTEROL 0.5-2.5 (3) MG/3ML IN SOLN
3.0000 mL | Freq: Once | RESPIRATORY_TRACT | Status: AC
Start: 2014-07-15 — End: 2014-07-15
  Administered 2014-07-15: 3 mL via RESPIRATORY_TRACT
  Filled 2014-07-15: qty 3

## 2014-07-15 MED ORDER — ACETAMINOPHEN 325 MG PO TABS
650.0000 mg | ORAL_TABLET | Freq: Four times a day (QID) | ORAL | Status: DC | PRN
  Administered 2014-07-15: 650 mg via ORAL
  Filled 2014-07-15: qty 2

## 2014-07-15 MED ORDER — ACETAMINOPHEN 650 MG RE SUPP: 650.0000 mg | Freq: Four times a day (QID) | RECTAL | Status: DC | PRN

## 2014-07-15 MED ORDER — FUROSEMIDE 10 MG/ML IJ SOLN
80.0000 mg | Freq: Once | INTRAMUSCULAR | Status: AC
  Administered 2014-07-15: 80 mg via INTRAVENOUS
  Filled 2014-07-15: qty 8

## 2014-07-15 MED ORDER — PANTOPRAZOLE SODIUM 40 MG PO TBEC
40.0000 mg | DELAYED_RELEASE_TABLET | Freq: Every day | ORAL | Status: DC
  Administered 2014-07-15 – 2014-07-16 (×2): 40 mg via ORAL
  Filled 2014-07-15 (×2): qty 1

## 2014-07-15 MED ORDER — SENNOSIDES-DOCUSATE SODIUM 8.6-50 MG PO TABS
2.0000 | ORAL_TABLET | Freq: Every day | ORAL | Status: DC
  Administered 2014-07-15: 2 via ORAL
  Filled 2014-07-15 (×2): qty 2

## 2014-07-15 MED ORDER — HEPARIN SODIUM (PORCINE) 5000 UNIT/ML IJ SOLN
5000.0000 [IU] | Freq: Three times a day (TID) | INTRAMUSCULAR | Status: DC
Start: 2014-07-15 — End: 2014-07-16
  Administered 2014-07-15 (×2): 5000 [IU] via SUBCUTANEOUS
  Filled 2014-07-15 (×3): qty 1

## 2014-07-15 MED ORDER — PREDNISONE 20 MG PO TABS
40.0000 mg | ORAL_TABLET | Freq: Every day | ORAL | Status: DC
  Administered 2014-07-16: 40 mg via ORAL
  Filled 2014-07-15 (×2): qty 2

## 2014-07-15 MED ORDER — BUDESONIDE 0.25 MG/2ML IN SUSP
0.2500 mg | Freq: Two times a day (BID) | RESPIRATORY_TRACT | Status: DC
  Administered 2014-07-15: 0.25 mg via RESPIRATORY_TRACT
  Filled 2014-07-15 (×5): qty 2

## 2014-07-15 MED ORDER — ASPIRIN 81 MG PO CHEW
81.0000 mg | CHEWABLE_TABLET | Freq: Every day | ORAL | Status: DC
  Administered 2014-07-15 – 2014-07-16 (×2): 81 mg via ORAL
  Filled 2014-07-15 (×2): qty 1

## 2014-07-15 MED ORDER — IPRATROPIUM-ALBUTEROL 0.5-2.5 (3) MG/3ML IN SOLN
3.0000 mL | RESPIRATORY_TRACT | Status: DC
  Administered 2014-07-15: 3 mL via RESPIRATORY_TRACT
  Filled 2014-07-15: qty 3

## 2014-07-15 MED ORDER — METHYLPREDNISOLONE SODIUM SUCC 125 MG IJ SOLR
60.0000 mg | Freq: Once | INTRAMUSCULAR | Status: AC
  Administered 2014-07-15: 60 mg via INTRAVENOUS
  Filled 2014-07-15: qty 2

## 2014-07-15 MED ORDER — SODIUM CHLORIDE 0.9 % IV BOLUS (SEPSIS)
500.0000 mL | Freq: Once | INTRAVENOUS | Status: AC
  Administered 2014-07-15: 500 mL via INTRAVENOUS

## 2014-07-15 MED ORDER — PRAVASTATIN SODIUM 10 MG PO TABS
10.0000 mg | ORAL_TABLET | Freq: Every day | ORAL | Status: DC
  Administered 2014-07-15: 10 mg via ORAL
  Filled 2014-07-15 (×2): qty 1

## 2014-07-15 MED ORDER — ALBUTEROL SULFATE (2.5 MG/3ML) 0.083% IN NEBU: 5.0000 mg | INHALATION_SOLUTION | RESPIRATORY_TRACT | Status: DC | PRN

## 2014-07-15 MED ORDER — ALBUTEROL SULFATE (2.5 MG/3ML) 0.083% IN NEBU
5.0000 mg | INHALATION_SOLUTION | Freq: Once | RESPIRATORY_TRACT | Status: AC
  Administered 2014-07-15: 5 mg via RESPIRATORY_TRACT
  Filled 2014-07-15: qty 6

## 2014-07-15 MED ORDER — IPRATROPIUM-ALBUTEROL 0.5-2.5 (3) MG/3ML IN SOLN
3.0000 mL | Freq: Three times a day (TID) | RESPIRATORY_TRACT | Status: DC
Start: 2014-07-15 — End: 2014-07-16
  Administered 2014-07-16 (×2): 3 mL via RESPIRATORY_TRACT
  Filled 2014-07-15 (×3): qty 3

## 2014-07-15 MED ORDER — POLYETHYLENE GLYCOL 3350 17 G PO PACK
17.0000 g | PACK | Freq: Every day | ORAL | Status: DC
  Administered 2014-07-15: 17 g via ORAL
  Filled 2014-07-15 (×2): qty 1

## 2014-07-15 NOTE — ED Provider Notes (Signed)
CSN: [id number]     Arrival date & time 07/15/14  1023 History   First MD Initiated Contact with Patient 07/15/14 1101     Chief Complaint  Patient presents with  . Shortness of Breath     (Consider location/radiation/quality/duration/timing/severity/associated sxs/prior Treatment) HPI Comments: Patient is a 59 yo F PMHx significant for CAD, pulm HTN, COPD on 4L home O2, CHF, HLD presenting to the ED for worsening shortness of breath over the last three days with associated congestion and cough. Patient has also required an increase in her home O2 to 5L. Denies fevers, vomiting, diarrhea, abdominal pain.   Patient is a 59 y.o. female presenting with shortness of breath.  Shortness of Breath Associated symptoms: cough   Associated symptoms: no chest pain     Past Medical History  Diagnosis Date  . Coronary artery disease     S/P PCI of LAD with DES (12/2008). Total occlusion of RCA noted at that time., medically managed. ACS ruled out 03/2009 with Lexiscan myoview . Followed by [facility].  . Pulmonary hypertension     2-D Echo (93/2671) - Systolic pressure was moderately increased. PA peak pressure  61mHg. 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 Followed by Dr. WJoya Gaskins(Velora Heckler  . Diastolic dysfunction     2-D Echo (12/2008) - Normal LV Systolic funciton with EF 60-65%. Grade 1 diastolid dysfunction. No regional wall motion abnormalities. Moderate pulmonary HTN with PA peak pressure 516mg.  . Marland KitchenOPD (chronic obstructive pulmonary disease)     Severe. Gold Stage IV.  PFTs (12/2008) - severe obstructive airway disease. Active tobacco use. Requires 4L O2 at home.  . Pulmonary nodule, right     Small right middle lobe nodule. Stable as of 12/2008.  . Marland Kitchenrediabetes 12/2008    HgbA1c 6.4 (12/2008)  . Hx MRSA infection     Recurrent MRSA thigh abscesses.  . Tobacco abuse     Ongoing.  .  Obesity   . Hyperlipidemia   . GERD (gastroesophageal reflux disease)     S/P Nissen fundoplication.  . CHF (congestive heart failure)    Past Surgical History  Procedure Laterality Date  . Total abdominal hysterectomy w/ bilateral salpingoophorectomy    . Nissen fundoplication    . Right heart catheterization N/A 11/09/2012    Procedure: RIGHT HEART CATH;  Surgeon: DaLarey DresserMD;  [facility];  Service: Cardiovascular;  Laterality: N/A;   Family History  Problem Relation Age of Onset  . Heart disease Mother [id number]  Deceased from MI at 60yo. Hypertension Mother   . Heart disease Father 5429  Deceased of MI age 60yo. Hypertension Father   . Hypertension Brother   . Lung cancer      Grandmother   History  Substance Use Topics  . Smoking status: Current Every Day Smoker -- 0.50 packs/day for 40 years    Types: Cigarettes  . Smokeless tobacco: Never Used     Comment: 1/2 pack or less. Wants to try the patches.  . Alcohol Use: No   OB History    No data available     Review of Systems  Respiratory: Positive for cough, chest tightness and shortness of breath.   Cardiovascular: Negative for chest pain.  All other systems reviewed and are negative.     Allergies  Fluconazole  Home Medications  Prior to Admission medications   Medication Sig Start Date End Date Taking? Authorizing Provider  albuterol (PROVENTIL HFA;VENTOLIN HFA) 108 (90 BASE) MCG/ACT inhaler Inhale 2 puffs into the lungs every 6 (six) hours as needed for wheezing or shortness of breath. 05/27/14  Yes Norman Herrlich, MD  albuterol (PROVENTIL) (2.5 MG/3ML) 0.083% nebulizer solution Take 2.5 mg by nebulization daily as needed for wheezing or shortness of breath.   Yes Historical Provider, MD  aspirin 81 MG chewable tablet Chew 1 tablet (81 mg total) by mouth daily. Patient taking differently: Chew 81 mg by mouth at bedtime.  05/27/14  Yes Norman Herrlich, MD  Fluticasone-Salmeterol (ADVAIR  DISKUS) 250-50 MCG/DOSE AEPB Inhale 1 puff into the lungs 2 (two) times daily. 07/07/14  Yes Oval Linsey, MD  furosemide (LASIX) 40 MG tablet Take 1 tablet (40 mg total) by mouth daily. Please note higher strength tablet.  Only take 1 tablet daily. 05/29/14  Yes Oval Linsey, MD  lovastatin (MEVACOR) 20 MG tablet Take 1 tablet (20 mg total) by mouth at bedtime. 05/29/14  Yes Oval Linsey, MD  pantoprazole (PROTONIX) 40 MG tablet Take 1 tablet (40 mg total) by mouth daily. 07/01/14  Yes Alexa Sherral Hammers, MD  Polyvinyl Alcohol-Povidone (TEARS PLUS OP) Place 1 drop into both eyes daily as needed (dry eyes).   Yes Historical Provider, MD  senna-docusate (SENOKOT-S) 8.6-50 MG per tablet Take 2 tablets by mouth at bedtime. 04/12/14  Yes Ejiroghene E Emokpae, MD  tiotropium (SPIRIVA HANDIHALER) 18 MCG inhalation capsule Place 1 capsule (18 mcg total) into inhaler and inhale daily. 05/29/14  Yes Oval Linsey, MD  diclofenac sodium (VOLTAREN) 1 % GEL Apply 2 g topically 4 (four) times daily. Patient not taking: Reported on 07/15/2014 04/12/14   Ejiroghene Arlyce Dice, MD  naproxen (NAPROSYN) 500 MG tablet Take 1 tablet (500 mg total) by mouth 2 (two) times daily with a meal. Patient not taking: Reported on 07/15/2014 07/01/14   Alexa Sherral Hammers, MD  polyethylene glycol (MIRALAX / GLYCOLAX) packet Take 17 g by mouth daily. Patient not taking: Reported on 07/15/2014 04/12/14   Ejiroghene E Emokpae, MD   BP 96/50 mmHg  Pulse 95  Temp(Src) 98.7 F (37.1 C) (Oral)  Resp 19  Wt 256 lb 9.6 oz (116.393 kg)  SpO2 91% Physical Exam  Constitutional: She is oriented to person, place, and time. She appears well-developed and well-nourished. She is not intubated. Nasal cannula in place.  HENT:  Head: Normocephalic and atraumatic.  Right Ear: External ear normal.  Left Ear: External ear normal.  Nose: Nose normal.  Eyes: Conjunctivae are normal.  Neck: Neck supple.  Cardiovascular: Normal rate, regular rhythm and  normal heart sounds.   Pulmonary/Chest: Accessory muscle usage present. No tachypnea. She is not intubated. She has decreased breath sounds in the right lower field and the left lower field. She has wheezes.  Abdominal: Soft. Bowel sounds are normal. There is no tenderness. There is no rigidity, no rebound and no guarding.  Musculoskeletal: Normal range of motion.  Neurological: She is alert and oriented to person, place, and time.  Skin: Skin is warm and dry. She is not diaphoretic.    ED Course  Procedures (including critical care time) Medications  pantoprazole (PROTONIX) EC tablet 40 mg (not administered)  pravastatin (PRAVACHOL) tablet 10 mg (not administered)  aspirin chewable tablet 81 mg (not administered)  polyethylene glycol (MIRALAX / GLYCOLAX) packet 17 g (not administered)  senna-docusate (Senokot-S) tablet 2 tablet (not  administered)  heparin injection 5,000 Units (not administered)  acetaminophen (TYLENOL) tablet 650 mg (not administered)    Or  acetaminophen (TYLENOL) suppository 650 mg (not administered)  ipratropium-albuterol (DUONEB) 0.5-2.5 (3) MG/3ML nebulizer solution 3 mL (not administered)  budesonide (PULMICORT) nebulizer solution 0.25 mg (not administered)  albuterol (PROVENTIL) (2.5 MG/3ML) 0.083% nebulizer solution 5 mg (not administered)  predniSONE (DELTASONE) tablet 40 mg (not administered)  ipratropium-albuterol (DUONEB) 0.5-2.5 (3) MG/3ML nebulizer solution 3 mL (3 mLs Nebulization Given 07/15/14 1226)  furosemide (LASIX) injection 80 mg (80 mg Intravenous Given 07/15/14 1307)  sodium chloride 0.9 % bolus 500 mL (0 mLs Intravenous Stopped 07/15/14 1427)  albuterol (PROVENTIL) (2.5 MG/3ML) 0.083% nebulizer solution 5 mg (5 mg Nebulization Given 07/15/14 1304)    Labs Review Labs Reviewed  BASIC METABOLIC PANEL - Abnormal; Notable for the following:    Chloride 92 (*)    CO2 43 (*)    Glucose, Bld 172 (*)    GFR calc non Af Amer 75 (*)    GFR calc Af  Amer 87 (*)    All other components within normal limits  BRAIN NATRIURETIC PEPTIDE - Abnormal; Notable for the following:    B Natriuretic Peptide 499.2 (*)    All other components within normal limits  CBC WITH DIFFERENTIAL/PLATELET - Abnormal; Notable for the following:    Platelets 143 (*)    Lymphocytes Relative 11 (*)    All other components within normal limits  I-STAT VENOUS BLOOD GAS, ED - Abnormal; Notable for the following:    pH, Ven 7.350 (*)    pCO2, Ven 77.3 (*)    pO2, Ven 61.0 (*)    Bicarbonate 42.7 (*)    Acid-Base Excess 13.0 (*)    All other components within normal limits  I-STAT TROPOININ, ED    Imaging Review Dg Chest 2 View  07/15/2014   CLINICAL DATA:  Shortness of breath with cough for 3 days  EXAM: CHEST  2 VIEW  COMPARISON:  June 29, 2014  FINDINGS: There is stable generalized interstitial edema with pulmonary venous hypertension and cardiomegaly. There is no airspace consolidation. No adenopathy. There are surgical clips in the upper abdomen. There is mild degenerative change in the thoracic spine.  IMPRESSION: Stable changes of congestive heart failure.  No new opacity.   Electronically Signed   By: Lowella Grip III M.D.   On: 07/15/2014 11:50     EKG Interpretation   Date/Time:  Tuesday July 15 2014 10:33:09 EDT Ventricular Rate:  96 PR Interval:  124 QRS Duration: 84 QT Interval:  338 QTC Calculation: 427 R Axis:   104 Text Interpretation:  Normal sinus rhythm Possible Right ventricular  hypertrophy Nonspecific ST and T wave abnormality Abnormal ECG No  significant change was found Confirmed by CAMPOS  MD, KEVIN ([id number]) on  07/15/2014 12:43:29 PM      MDM   Final diagnoses:  Acute exacerbation of chronic obstructive pulmonary disease (COPD)    Filed Vitals:   07/15/14 1503  BP:   Pulse: 95  Temp: 98.7 F (37.1 C)  Resp: 19   Afebrile, NAD, non-toxic appearing, AAOx4.  I have reviewed nursing notes, vital signs, and all  appropriate lab and imaging results for this patient.   Patient with likely COPD exacerbation. Diminished breath sounds at bases bilaterally with wheezes noted at apices. Lung exam improved after albuterol treatment. Patient mildly hypotensive, gentle IV hydration will be ordered. Given evidence of interstitial edema with increased BNP will  order IV Lasix. Will admit patient to internal medicine teaching service for further management and evaluation. Patient d/w with Dr. Venora Maples, agrees with plan.    Baron Sane, PA-C 07/15/14 [place], MD 07/15/14 [phone]

## 2014-07-15 NOTE — ED Notes (Signed)
Patient transported to X-ray 

## 2014-07-15 NOTE — H&P (Signed)
Date: 07/15/2014               Patient Name:  [name] MRN: [id number]  DOB: 11-Feb-1956 Age / Sex: 59 y.o., female   PCP: Oval Linsey, MD         Medical Service: Internal Medicine Teaching Service         Attending Physician: Dr. Aldine Contes, MD    First Contact: Dr. Lottie Mussel Pager: [phone]  Second Contact: Dr. Michail Jewels Pager: [phone]       After Hours (After 5p/  First Contact Pager: [phone]  weekends / holidays): Second Contact Pager: [phone]   Chief Complaint: SOB  History of Present Illness: [name] is a 59 year old woman with COPD Gold stage IV, CAD s/p PCI 2010, pre-diabetes, dCHF, HL who presents with SOB. She was scheduled to come to [facility] clinic for follow-up on 3/27-3/29/16 admission for chest pain rule-out but came to the ED today due to increased SOB. She says for the past few days she has had increased SOB, particularly with exertion. She also has had a concurrent cough productive of green sputum which is new. She said she felt wheezy until the breathing treatments in the ED. She says she is compliant with her advair and spiriva at home. She is using her albuterol three or four times a day when normally she does not use it at all. She denies any fevers, chills, night sweats, chest pain, abdominal pain, nausea, emesis, diarrhea, constipation.   In the ED, one duoneb treatment, one albuterol neb treatment, lasix 80 mg iv once, and NS 500 cc bolus once. She felt her breathing had improved after the second breathing treatment.  Meds: Current Facility-Administered Medications  Medication Dose Route Frequency Provider Last Rate Last Dose  . acetaminophen (TYLENOL) tablet 650 mg  650 mg Oral Q6H PRN Jones Bales, MD       Or  . acetaminophen (TYLENOL) suppository 650 mg  650 mg Rectal Q6H PRN Jones Bales, MD      . albuterol (PROVENTIL) (2.5 MG/3ML) 0.083% nebulizer solution 5 mg  5 mg Nebulization Q2H PRN Jones Bales, MD      . aspirin  chewable tablet 81 mg  81 mg Oral Daily Jones Bales, MD      . budesonide (PULMICORT) nebulizer solution 0.25 mg  0.25 mg Nebulization BID Jones Bales, MD      . heparin injection 5,000 Units  5,000 Units Subcutaneous 3 times per day Jones Bales, MD      . ipratropium-albuterol (DUONEB) 0.5-2.5 (3) MG/3ML nebulizer solution 3 mL  3 mL Nebulization Q4H Jones Bales, MD      . levofloxacin (LEVAQUIN) tablet 750 mg  750 mg Oral Daily Kelby Aline, MD      . methylPREDNISolone sodium succinate (SOLU-MEDROL) 125 mg/2 mL injection 60 mg  60 mg Intravenous Once Kelby Aline, MD      . pantoprazole (PROTONIX) EC tablet 40 mg  40 mg Oral Daily Jones Bales, MD      . polyethylene glycol (MIRALAX / GLYCOLAX) packet 17 g  17 g Oral Daily Jones Bales, MD      . pravastatin (PRAVACHOL) tablet 10 mg  10 mg Oral q1800 Jones Bales, MD      . Derrill Memo ON 07/16/2014] predniSONE (DELTASONE) tablet 40 mg  40 mg Oral Q breakfast Jones Bales, MD      .  senna-docusate (Senokot-S) tablet 2 tablet  2 tablet Oral QHS Jones Bales, MD        Allergies: Allergies as of 07/15/2014 - Review Complete 07/15/2014  Allergen Reaction Noted  . Fluconazole Anaphylaxis, Itching, and Swelling    Past Medical History  Diagnosis Date  . Coronary artery disease     S/P PCI of LAD with DES (12/2008). Total occlusion of RCA noted at that time., medically managed. ACS ruled out 03/2009 with Lexiscan myoview . Followed by [facility].  . Pulmonary hypertension     2-D Echo (30/1601) - Systolic pressure was moderately increased. PA peak pressure  9mHg. 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 Followed by Dr. WJoya Gaskins(Velora Heckler  . Diastolic dysfunction     2-D Echo (12/2008) - Normal LV Systolic funciton with EF 60-65%. Grade 1 diastolid dysfunction. No regional wall motion abnormalities.  Moderate pulmonary HTN with PA peak pressure 589mg.  . Marland KitchenOPD (chronic obstructive pulmonary disease)     Severe. Gold Stage IV.  PFTs (12/2008) - severe obstructive airway disease. Active tobacco use. Requires 4L O2 at home.  . Pulmonary nodule, right     Small right middle lobe nodule. Stable as of 12/2008.  . Marland Kitchenrediabetes 12/2008    HgbA1c 6.4 (12/2008)  . Hx MRSA infection     Recurrent MRSA thigh abscesses.  . Tobacco abuse     Ongoing.  . Obesity   . Hyperlipidemia   . GERD (gastroesophageal reflux disease)     S/P Nissen fundoplication.  . CHF (congestive heart failure)    Past Surgical History  Procedure Laterality Date  . Total abdominal hysterectomy w/ bilateral salpingoophorectomy    . Nissen fundoplication    . Right heart catheterization N/A 11/09/2012    Procedure: RIGHT HEART CATH;  Surgeon: DaLarey DresserMD;  [facility];  Service: Cardiovascular;  Laterality: N/A;   Family History  Problem Relation Age of Onset  . Heart disease Mother 471  Deceased from MI at 60yo. Hypertension Mother   . Heart disease Father 5446  Deceased of MI age 60yo. Hypertension Father   . Hypertension Brother   . Lung cancer      Grandmother   History   Social History  . Marital Status: Single    Spouse Name: N/A  . Number of Children: N/A  . Years of Education: N/A   Occupational History  . Not on file.   Social History Main Topics  . Smoking status: Current Every Day Smoker -- 0.50 packs/day for 40 years    Types: Cigarettes  . Smokeless tobacco: Never Used     Comment: 1/2 pack or less. Wants to try the patches.  . Alcohol Use: No  . Drug Use: No  . Sexual Activity: Not on file   Other Topics Concern  . Not on file   Social History Narrative   Formerly worked as a caScientist, water qualitynow disabled.   Divorced.   2 grown children.   Lives with her grandson.    Review of Systems: A comprehensive review of systems was negative except for: as noted above per  HPI  Physical Exam: Blood pressure 96/50, pulse 89, temperature 98.7 F (37.1 C), temperature source Oral, resp. rate 19, weight 256 lb 9.6 oz (116.393 kg), SpO2 90 %.  Gen: A&O x 4, No acute distress sitting  on side of bed, obese HEENT: Atraumatic, PERRL, EOMI, sclerae anicteric, moist mucous membranes Heart: Regular rate and rhythm, normal S1 S2, no murmurs, rubs, or gallops Lungs: Small expiratory wheezes bilaterally, respirations unlabored on 5 L O2 nasal cannula Abd: Soft, non-tender, non-distended, + bowel sounds, no hepatosplenomegaly Ext: 0-1+ pitting edema to shins bilaterally  Lab results: Basic Metabolic Panel:  Recent Labs  07/15/14 1045  NA 141  K 4.2  CL 92*  CO2 43*  GLUCOSE 172*  BUN 9  CREATININE 0.83  CALCIUM 8.9   CBC:  Recent Labs  07/15/14 1045  WBC 7.8  NEUTROABS 5.9  HGB 13.1  HCT 41.8  MCV 99.8  PLT 143*   Venous blood gas pH 7.35, pCO2 77, pO2 43, bicarb 43 i-stat troponin 0.02 BNP 499  Imaging results:  Dg Chest 2 View  07/15/2014   CLINICAL DATA:  Shortness of breath with cough for 3 days  EXAM: CHEST  2 VIEW  COMPARISON:  June 29, 2014  FINDINGS: There is stable generalized interstitial edema with pulmonary venous hypertension and cardiomegaly. There is no airspace consolidation. No adenopathy. There are surgical clips in the upper abdomen. There is mild degenerative change in the thoracic spine.  IMPRESSION: Stable changes of congestive heart failure.  No new opacity.   Electronically Signed   By: Lowella Grip III M.D.   On: 07/15/2014 11:50    Other results: EKG: NSR, no ST or t-wave changes, compared to prior 07/01/14  Assessment & Plan by Problem: Principal Problem:   COPD with acute exacerbation   #Acute COPD exacerbation: Morgan Harral is likely having acute COPD exacerbation. She has several days of increasing SOB with cough productive of green sputum. She was wheezy but had clearing of much of her wheezing after neb treatment  in ED. While, she is afebrile with no leukocytosis, she does have increased dyspnea and sputum as well as risk factors of heart disease and FEV1<50% so will give antibiotics. CXR was stable from previous on 06/29/14 with generalzied interstitial edema with pulmonary venous HTN and cardiomegaly. She is Gold stage IV with last PFT 2010 with FEV1/FVC 61%, FEV1 37% predicted with no improvement on bronchodilators though suggestive of severe airway obstruction. She is on home O2 4L and has had several referrals to pulmonology but has not gone due to cost and transprtation. At home she is on advair 1 puff bid, spiriva 18 mcg inh daily, albuterol 2.5 mg neb dailyprn/albuterol 2 puff q6hprn. -duoneb 3 mL neb q4h scheduled -pulmicort (budesonide) 0.25 mg neb bid -albuterol 5 mg neb q2hprn -solumedrol 60 mg iv once, then prednisone 40 mg daily -levofloxacin 750 mg po daily -O2 supplementation as needed with goal oxygenation sat low 90s -daily weights -THN consult (patient resistant)  #CAD: Morgan Casella is s/p DES to LAD (90% stenosed) in 2010. She had a lexiscan myoview 07/01/14 when she presented with chest pain which showed no reversible ischemia or infarction, normal left ventricular wall motion, EF 63% and overall low-risk stress test. Today her EKG is unchanged from prior and i-stat troponin negative with no chest pain. At home she is on ASA 81 mg daily, lovastatin 20 mg qhs. -cont ASA 81 mg daily and pravastatin 10 mg daily  #Chronic diastolic CHF: Last echo 2/70/78 with EF 67-54%, grade 1 diastolic dysfunction. At home she is on ASA 81 mg daily, lasix 40 mg daily, lovastatin 20 mg qhs. -cont ASA 81 mg daily, pravastatin 10 mg daily -hold lasix 40  mg daily for now as soft BP and minimal LE edema  #pre-DM:  Last A1c 5.6 on 02/2014. Currently managed with lifestyle -cont to monitor blood glucose  #HL: At home she is on lovastatin 20 mg qhs. Last lipid panel 11/2012 with total cholesterol 237, LDL 136, HDL 30,  triglyceride 356. -PCP may consider changing statin to high intensity such as atorvastatin 40 mg qhs -cont pravastatin 10 mg daily  #Diet: HH/CM  #DVT PPx: heparin 5000 u [place] tid  #Code: Full  Dispo: Disposition is deferred at this time, awaiting improvement of current medical problems. Anticipated discharge in approximately 1-2 day(s).   The patient does have a current PCP Oval Linsey, MD) and does need an [facility] hospital follow-up appointment after discharge.  The patient does not know have transportation limitations that hinder transportation to clinic appointments.  Signed: Kelby Aline, MD 07/15/2014, 3:52 PM

## 2014-07-15 NOTE — ED Notes (Signed)
Pt from home c/o increased SOB and chest tightness; pt on 5L of O2 at home

## 2014-07-15 NOTE — ED Notes (Signed)
Lab results given to Jan, Pa.

## 2014-07-16 ENCOUNTER — Other Ambulatory Visit: Payer: Self-pay

## 2014-07-16 DIAGNOSIS — K219 Gastro-esophageal reflux disease without esophagitis: Secondary | ICD-10-CM | POA: Diagnosis not present

## 2014-07-16 DIAGNOSIS — Z7982 Long term (current) use of aspirin: Secondary | ICD-10-CM | POA: Diagnosis not present

## 2014-07-16 DIAGNOSIS — I2510 Atherosclerotic heart disease of native coronary artery without angina pectoris: Secondary | ICD-10-CM | POA: Diagnosis not present

## 2014-07-16 DIAGNOSIS — Z7951 Long term (current) use of inhaled steroids: Secondary | ICD-10-CM | POA: Diagnosis not present

## 2014-07-16 DIAGNOSIS — Z720 Tobacco use: Secondary | ICD-10-CM | POA: Diagnosis not present

## 2014-07-16 DIAGNOSIS — J441 Chronic obstructive pulmonary disease with (acute) exacerbation: Secondary | ICD-10-CM

## 2014-07-16 DIAGNOSIS — E785 Hyperlipidemia, unspecified: Secondary | ICD-10-CM | POA: Diagnosis not present

## 2014-07-16 DIAGNOSIS — I509 Heart failure, unspecified: Secondary | ICD-10-CM | POA: Diagnosis not present

## 2014-07-16 DIAGNOSIS — E669 Obesity, unspecified: Secondary | ICD-10-CM | POA: Diagnosis not present

## 2014-07-16 LAB — CBC
HCT: 42.5 % (ref 36.0–46.0)
Hemoglobin: 13.3 g/dL (ref 12.0–15.0)
MCH: 31.1 pg (ref 26.0–34.0)
MCHC: 31.3 g/dL (ref 30.0–36.0)
MCV: 99.5 fL (ref 78.0–100.0)
Platelets: 177 10*3/uL (ref 150–400)
RBC: 4.27 MIL/uL (ref 3.87–5.11)
RDW: 14 % (ref 11.5–15.5)
WBC: 6.3 10*3/uL (ref 4.0–10.5)

## 2014-07-16 LAB — BASIC METABOLIC PANEL
Anion gap: 9 (ref 5–15)
BUN: 16 mg/dL (ref 6–23)
CALCIUM: 8.6 mg/dL (ref 8.4–10.5)
CO2: 39 mmol/L — ABNORMAL HIGH (ref 19–32)
Chloride: 93 mmol/L — ABNORMAL LOW (ref 96–112)
Creatinine, Ser: 0.85 mg/dL (ref 0.50–1.10)
GFR, EST AFRICAN AMERICAN: 85 mL/min — AB (ref >90)
GFR, EST NON AFRICAN AMERICAN: 73 mL/min — AB (ref >90)
GLUCOSE: 147 mg/dL — AB (ref 70–99)
Potassium: 4.9 mmol/L (ref 3.5–5.1)
SODIUM: 141 mmol/L (ref 135–145)

## 2014-07-16 MED ORDER — PREDNISONE 20 MG PO TABS: 40.0000 mg | ORAL_TABLET | Freq: Every day | ORAL | Status: DC

## 2014-07-16 MED ORDER — FUROSEMIDE 40 MG PO TABS
40.0000 mg | ORAL_TABLET | Freq: Every day | ORAL | Status: DC
  Filled 2014-07-16: qty 1

## 2014-07-16 MED ORDER — ALBUTEROL SULFATE (2.5 MG/3ML) 0.083% IN NEBU: 2.5000 mg | INHALATION_SOLUTION | Freq: Four times a day (QID) | RESPIRATORY_TRACT | Status: DC | PRN

## 2014-07-16 MED ORDER — LEVOFLOXACIN 750 MG PO TABS: 750.0000 mg | ORAL_TABLET | Freq: Every day | ORAL | Status: DC

## 2014-07-16 NOTE — Discharge Summary (Signed)
Name: [name] MRN: [id number] DOB: [birth year] 59 y.o. PCP: Oval Linsey, MD  Date of Admission: 07/15/2014 10:56 AM Date of Discharge: 07/16/2014 Attending Physician: Aldine Contes, MD  Discharge Diagnosis:  Principal Problem:   COPD with acute exacerbation Active Problems:   Sleep apnea   Hyperlipidemia   Chronic diastolic CHF (congestive heart failure)  Discharge Medications:   Medication List    TAKE these medications        albuterol 108 (90 BASE) MCG/ACT inhaler  Commonly known as:  PROVENTIL HFA;VENTOLIN HFA  Inhale 2 puffs into the lungs every 6 (six) hours as needed for wheezing or shortness of breath.     albuterol (2.5 MG/3ML) 0.083% nebulizer solution  Commonly known as:  PROVENTIL  Take 3 mLs (2.5 mg total) by nebulization every 6 (six) hours as needed for wheezing or shortness of breath.     aspirin 81 MG chewable tablet  Chew 1 tablet (81 mg total) by mouth daily.     Fluticasone-Salmeterol 250-50 MCG/DOSE Aepb  Commonly known as:  ADVAIR DISKUS  Inhale 1 puff into the lungs 2 (two) times daily.     furosemide 40 MG tablet  Commonly known as:  LASIX  Take 1 tablet (40 mg total) by mouth daily. Please note higher strength tablet.  Only take 1 tablet daily.     levofloxacin 750 MG tablet  Commonly known as:  LEVAQUIN  Take 1 tablet (750 mg total) by mouth daily. Take Thurs 4/14, Fri 4/15, and Sat 4/16     lovastatin 20 MG tablet  Commonly known as:  MEVACOR  Take 1 tablet (20 mg total) by mouth at bedtime.     pantoprazole 40 MG tablet  Commonly known as:  PROTONIX  Take 1 tablet (40 mg total) by mouth daily.     predniSONE 20 MG tablet  Commonly known as:  DELTASONE  Take 2 tablets (40 mg total) by mouth daily with breakfast. Take Thurs 4/14, Fri 4/15, and Sat 4/16     senna-docusate 8.6-50 MG per tablet  Commonly known as:  Senokot-S  Take 2 tablets by mouth at bedtime.     TEARS PLUS OP  Place 1 drop into both eyes daily as  needed (dry eyes).     tiotropium 18 MCG inhalation capsule  Commonly known as:  SPIRIVA HANDIHALER  Place 1 capsule (18 mcg total) into inhaler and inhale daily.        Disposition and follow-up:   [name] was discharged from [facility] in [place] condition.  At the hospital follow up visit please address:  1.  Respiratory status, tobacco abuse, statin intensity, whether she is more open to [facility] services to prevent readmission  2.  Labs / imaging needed at time of follow-up: none  3.  Pending labs/ test needing follow-up: none  Follow-up Appointments: Follow-up Information    Follow up with Albin Felling, MD On 07/23/2014.   Specialty:  Internal Medicine   Why:  @ 9:15 am   Contact information:   [place] [address] [phone]       Discharge Instructions: Discharge Instructions    Diet - low sodium heart healthy    Complete by:  As directed      Increase activity slowly    Complete by:  As directed            Procedures Performed:  Dg Chest 2 View  07/15/2014   CLINICAL  DATA:  Shortness of breath with cough for 3 days  EXAM: CHEST  2 VIEW  COMPARISON:  June 29, 2014  FINDINGS: There is stable generalized interstitial edema with pulmonary venous hypertension and cardiomegaly. There is no airspace consolidation. No adenopathy. There are surgical clips in the upper abdomen. There is mild degenerative change in the thoracic spine.  IMPRESSION: Stable changes of congestive heart failure.  No new opacity.   Electronically Signed   By: Lowella Grip III M.D.   On: 07/15/2014 11:50   Dg Chest 2 View  06/29/2014   CLINICAL DATA:  59 year old female with shortness of breath and midsternal chest pain since yesterday.  EXAM: CHEST  2 VIEW  COMPARISON:  Chest x-ray 05/22/2014.  FINDINGS: There is cephalization of the pulmonary vasculature and slight indistinctness of the interstitial markings suggestive of mild pulmonary edema. No pleural  effusions. Mild cardiomegaly. Upper mediastinal contours are within normal limits. Numerous surgical clips near the gastroesophageal junction.  IMPRESSION: 1. The appearance of the chest suggests mild congestive heart failure, as above.   Electronically Signed   By: Vinnie Langton M.D.   On: 06/29/2014 18:47   Nm Myocar Multi W/spect W/wall Motion / Ef  07/01/2014   CLINICAL DATA:  Acute chest pain  EXAM: MYOCARDIAL IMAGING WITH SPECT (REST AND PHARMACOLOGIC-STRESS)  GATED LEFT VENTRICULAR WALL MOTION STUDY  LEFT VENTRICULAR EJECTION FRACTION  TECHNIQUE: Standard myocardial SPECT imaging was performed after resting intravenous injection of 10 mCi Tc-67msestamibi. Subsequently, intravenous infusion of Lexiscan was performed under the supervision of the Cardiology staff. At peak effect of the drug, 30 mCi [id number] was injected intravenously and standard myocardial SPECT imaging was performed. Quantitative gated imaging was also performed to evaluate left ventricular wall motion, and estimate left ventricular ejection fraction.  COMPARISON:  None.  FINDINGS: Perfusion: Allowing for attenuation artifact of the inferior wall and septum, No definite decreased activity in the left ventricle on stress imaging to suggest reversible ischemia or infarction.  Wall Motion: Normal left ventricular wall motion. No left ventricular dilation.  Left Ventricular Ejection Fraction: 63 %  End diastolic volume 98 ml  End systolic volume 37 ml  IMPRESSION: 1. No reversible ischemia or infarction.  2. Normal left ventricular wall motion.  3. Left ventricular ejection fraction 63%  4. Low-risk stress test findings*.  *2012 Appropriate Use Criteria for Coronary Revascularization Focused Update: J Am Coll Cardiol. 202979;89(2):119-417http://content.onairportbarriers.comspx?articleid=[email or web address]   Electronically Signed   By: M.Jerilynn Mages Shick M.D.   On: 07/01/2014 11:27   Dg Hip Unilat With Pelvis 2-3 Views Left  06/30/2014   CLINICAL  DATA:  Patient status post fall 6-9 months ago. Left hip pain and bruising in the gluteal area. No numbness or tingling.  EXAM: LEFT HIP (WITH PELVIS) 2-3 VIEWS  COMPARISON:  None.  FINDINGS: Mild left hip joint degenerative changes. Normal anatomic alignment. No evidence for acute fracture or dislocation. Exam limited due to body habitus.  IMPRESSION: Exam limited due to body habitus.  No acute osseous abnormality.   Electronically Signed   By: DrLovey Newcomer.D.   On: 06/30/2014 12:04    Admission HPI: Ms PaBlasdells a [age]ear old woman with COPD Gold stage IV, CAD s/p PCI 2010, pre-diabetes, dCHF, HL who presents with SOB. [facility]3/29[facility] admission for chest pain rule-out but came to the ED today due to increased SOB. She says for the past few days she  has had increased SOB, particularly with exertion. She also has had a concurrent cough productive of green sputum which is new. She said she felt wheezy until the breathing treatments in the ED. She says she is compliant with her advair and spiriva at home. She is using her albuterol three or four times a day when normally she does not use it at all. She denies any fevers, chills, night sweats, chest pain, abdominal pain, nausea, emesis, diarrhea, constipation.   In the ED, one duoneb treatment, one albuterol neb treatment, lasix 80 mg iv once, and NS 500 cc bolus once. She felt her breathing had improved after the second breathing treatment.  Hospital Course by problem list: Principal Problem:   COPD with acute exacerbation Active Problems:   Sleep apnea   Hyperlipidemia   Chronic diastolic CHF (congestive heart failure)   #Acute COPD exacerbation: Ms Marlar was admitted for acute COPD exacerbation. She had several days of increasing SOB with cough productive of green sputum. She was wheezy but had clearing of much of her wheezing after neb treatment in ED. While, she is afebrile with no leukocytosis,  she does have increased dyspnea and sputum as well as risk factors of heart disease and FEV1<50% so will give antibiotics. CXR was stable from previous on 06/29/14 with generalzied interstitial edema with pulmonary venous HTN and cardiomegaly. She is Gold stage IV with last PFT 2010 with FEV1/FVC 61%, FEV1 37% predicted with no improvement on bronchodilators though suggestive of severe airway obstruction. She is on home O2 4L and has had several referrals to pulmonology but has not gone due to cost and transprtation. At home she is on advair 1 puff bid, spiriva 18 mcg inh daily, albuterol 2.5 mg neb dailyprn/albuterol 2 puff q6hprn. She received scheduled duonebs, pulmicort, as well as albuterol prn. She received solumedrol 60 mg iv once on admission with much improvement in symptoms. She was transitioned to prednisone 40 mg po the day of discharge. She is to complete 5 day course of steroids and levofloxacin 750 mg po daily on 4/16. She was also offered [facility] consult given frequent admissions but very resistant. She was educated on importance of tobacco cessation.  #CAD: Ms Guerrieri is s/p DES to LAD (90% stenosed) in 2010. She had a lexiscan myoview 07/01/14 when she presented with chest pain which showed no reversible ischemia or infarction, normal left ventricular wall motion, EF 63% and overall low-risk stress test. Today her EKG is unchanged from prior and i-stat troponin negative with no chest pain. At home she is on ASA 81 mg daily, lovastatin 20 mg qhs which were continued.  #Chronic diastolic CHF: Last echo 3/55/97 with EF 41-63%, grade 1 diastolic dysfunction. At home she is on ASA 81 mg daily, lasix 40 mg daily, lovastatin 20 mg qhs. The ASA and statin were continued while the lasix was initially held for a day due to soft BP but resumed on discharge.  #HL: At home she is on lovastatin 20 mg qhs. Last lipid panel 11/2012 with total cholesterol 237, LDL 136, HDL 30, triglyceride 356. PCP may consider  changing statin to high intensity such as atorvastatin 40 mg qhs.  #pre-DM: Last A1c 5.6 on 02/2014. Currently managed with lifestyle.  Discharge Vitals:   BP 102/60 mmHg  Pulse 82  Temp(Src) 98.6 F (37 C) (Oral)  Resp 18  Wt 256 lb 9.9 oz (116.4 kg)  SpO2 95%  Discharge Physical Exam: Gen: A&O x 4, no acute distress laying  in bed, obese HEENT: Atraumatic, PERRL, EOMI, sclerae anicteric, moist mucous membranes Heart: Regular rate and rhythm, normal S1 S2, no murmurs, rubs, or gallops Lungs: Clear to ascultation bilaterally, respirations unlabored on 5 L O2 nasal cannula Abd: Soft, non-tender, non-distended, + bowel sounds, no hepatosplenomegaly Ext: 0-1+ pitting edema to shins bilaterally  Discharge Labs:  Basic Metabolic Panel:  Recent Labs  07/15/14 1045 07/16/14 0426  NA 141 141  K 4.2 4.9  CL 92* 93*  CO2 43* 39*  GLUCOSE 172* 147*  BUN 9 16  CREATININE 0.83 0.85  CALCIUM 8.9 8.6   CBC:  Recent Labs  07/15/14 1045 07/16/14 0426  WBC 7.8 6.3  NEUTROABS 5.9  --   HGB 13.1 13.3  HCT 41.8 42.5  MCV 99.8 99.5  PLT 143* 177   Misc. Labs: Venous blood gas pH 7.35, pCO2 77, pO2 43, bicarb 43 i-stat troponin 0.02 BNP 499   Signed: Kelby Aline, MD 07/16/2014, 1:22 PM    Services Ordered on Discharge: none Equipment Ordered on Discharge: none

## 2014-07-16 NOTE — Progress Notes (Signed)
Subjective: [name] feels much improved today. She says that her SOB is nearly to baseline and cough is decreased. She was a little wheezy when she woke up this morning but that has resolved. She was agreeable to continuing oral steroid and antibiotics through Saturday 4/16  Objective: Vital signs in last 24 hours: Filed Vitals:   07/15/14 2124 07/15/14 2334 07/16/14 0532 07/16/14 0733  BP: 106/88  113/64   Pulse: 96  87   Temp: 98.6 F (37 C)  98.4 F (36.9 C)   TempSrc: Oral  Oral   Resp: 18  18   Weight: 256 lb 9.9 oz (116.4 kg)     SpO2: 92% 88% 95% 91%   Weight change:   Intake/Output Summary (Last 24 hours) at 07/16/14 1052 Last data filed at 07/16/14 1700  Gross per 24 hour  Intake    720 ml  Output    925 ml  Net   -205 ml   Gen: A&O x 4, no acute distress laying in bed, obese HEENT: Atraumatic, PERRL, EOMI, sclerae anicteric, moist mucous membranes Heart: Regular rate and rhythm, normal S1 S2, no murmurs, rubs, or gallops Lungs: Clear to ascultation bilaterally, respirations unlabored on 5 L O2 nasal cannula Abd: Soft, non-tender, non-distended, + bowel sounds, no hepatosplenomegaly Ext: 0-1+ pitting edema to shins bilaterally  Lab Results: Basic Metabolic Panel:  Recent Labs Lab 07/15/14 1045 07/16/14 0426  NA 141 141  K 4.2 4.9  CL 92* 93*  CO2 43* 39*  GLUCOSE 172* 147*  BUN 9 16  CREATININE 0.83 0.85  CALCIUM 8.9 8.6   CBC:  Recent Labs Lab 07/15/14 1045 07/16/14 0426  WBC 7.8 6.3  NEUTROABS 5.9  --   HGB 13.1 13.3  HCT 41.8 42.5  MCV 99.8 99.5  PLT 143* 177   Venous blood gas pH 7.35, pCO2 77, pO2 43, bicarb 43 i-stat troponin 0.02 BNP 499  Micro Results: No results found for this or any previous visit (from the past 240 hour(s)). Studies/Results: Dg Chest 2 View  07/15/2014   CLINICAL DATA:  Shortness of breath with cough for 3 days  EXAM: CHEST  2 VIEW  COMPARISON:  June 29, 2014  FINDINGS: There is stable generalized  interstitial edema with pulmonary venous hypertension and cardiomegaly. There is no airspace consolidation. No adenopathy. There are surgical clips in the upper abdomen. There is mild degenerative change in the thoracic spine.  IMPRESSION: Stable changes of congestive heart failure.  No new opacity.   Electronically Signed   By: Lowella Grip III M.D.   On: 07/15/2014 11:50   Medications: I have reviewed the patient's current medications. Scheduled Meds: . aspirin  81 mg Oral Daily  . budesonide (PULMICORT) nebulizer solution  0.25 mg Nebulization BID  . furosemide  40 mg Oral Daily  . heparin  5,000 Units Subcutaneous 3 times per day  . ipratropium-albuterol  3 mL Nebulization TID  . levofloxacin  750 mg Oral Daily  . pantoprazole  40 mg Oral Daily  . polyethylene glycol  17 g Oral Daily  . pravastatin  10 mg Oral q1800  . predniSONE  40 mg Oral Q breakfast  . senna-docusate  2 tablet Oral QHS   Continuous Infusions:  PRN Meds:.acetaminophen **OR** acetaminophen, albuterol Assessment/Plan: Principal Problem:   COPD with acute exacerbation Active Problems:   Sleep apnea   Hyperlipidemia   Chronic diastolic CHF (congestive heart failure)  #Acute COPD exacerbation: [name] feels much improved  since receiving iv solumedrol and beginning levaquin. She is currently sating low to mid 90s on nasal cannula. Lungs clear on exam. She is agreeable with finishing 5 day course of antibiotics and steroids as out-patient with close PCP follow-up. -duoneb 3 mL neb q4h scheduled -pulmicort (budesonide) 0.25 mg neb bid -albuterol 5 mg neb q2hprn -prednisone 40 mg daily through 4/16 (5 d) -levofloxacin 750 mg po daily trough 4/16 (5 d) -O2 supplementation as needed with goal oxygenation sat low 90s -daily weights -[facility] consult (patient resistant)  #CAD: [name] is s/p DES to LAD (90% stenosed) in 2010. She had a lexiscan myoview 07/01/14 when she presented with chest pain which showed no reversible  ischemia or infarction, normal left ventricular wall motion, EF 63% and overall low-risk stress test. Today her EKG is unchanged from prior and i-stat troponin negative with no chest pain. At home she is on ASA 81 mg daily, lovastatin 20 mg qhs. -cont ASA 81 mg daily and pravastatin 10 mg daily  #Chronic diastolic CHF: Last echo 2/76/18 with EF 48-59%, grade 1 diastolic dysfunction. At home she is on ASA 81 mg daily, lasix 40 mg daily, lovastatin 20 mg qhs. -cont ASA 81 mg daily, pravastatin 10 mg daily -resume lasix 40 mg daily  #pre-DM: Last A1c 5.6 on 02/2014. Currently managed with lifestyle -cont to monitor blood glucose  #HL: At home she is on lovastatin 20 mg qhs. Last lipid panel 11/2012 with total cholesterol 237, LDL 136, HDL 30, triglyceride 356. -PCP may consider changing statin to high intensity such as atorvastatin 40 mg qhs -cont pravastatin 10 mg daily  #Diet: HH/CM  #DVT PPx: heparin 5000 u  tid  #Code: Full  Dispo: Disposition is deferred at this time, awaiting improvement of current medical problems.  Anticipated discharge in approximately 1 day(s).   The patient does have a current PCP Oval Linsey, MD) and does need an [facility] hospital follow-up appointment after discharge.  The patient does not know have transportation limitations that hinder transportation to clinic appointments.  .Services Needed at time of discharge: Y = Yes, Blank = No PT:   OT:   RN:   Equipment:   Other:       Kelby Aline, MD 07/16/2014, 10:52 AM

## 2014-07-16 NOTE — Progress Notes (Signed)
UR completed.

## 2014-07-16 NOTE — Consult Note (Signed)
Spoke with patient at bedside regarding the restart of services with [facility]. Patient declined services, stating, "I know you guys are there but, I just don't want all of that right now.  . Patient signed consent form and packet with information given for disease management support or other services. Updated inpatient RNCM of patient's decision.  Will notify referral source of patient declining services.  For questions please contact: Natividad Brood, RN BSN [facility] Liaison.  [phone] business mobile phone

## 2014-07-16 NOTE — Discharge Instructions (Signed)
It was a pleasure to care for you at [facility]. You are to take three more days (Thursday 4/14, Friday 4/15, Saturday 4/16) of the levofloxacin antibiotic and prednisone 40 mg steroid which were sent to the [place]. Please attend your follow-up appointment with Dr Albin Felling on 4/20 . Please return to the Emergency Department or seek medical attention if you have any new or worsening trouble breathing, chest pain, or other worrisome medical condition.   Lottie Mussel, MD

## 2014-07-16 NOTE — Progress Notes (Signed)
Patient seen and examined. Case d/w residents in detail. I have reviewed Dr. Bebe Shaggy note and agree with findings and plan as documented  Patient is a 59 y/o female with PMH of COPD Gold stage IV, CAD, pre diabetes, chronic dCHF, HLD who p/w SOB and cough. Patient was recently admitted from 3/27-3/29 for CP rule out but now returns with worsening SOB assoc with a cough productive of greenish phlegm. SOB worsened with exertion and progressed over the last 2-3 days. She has required multiple doses of her albuterol at home and also complained of assoc wheezing.   Physical Exam: Gen: AAO*3, NAD CVS: RRR, normal heart sounds Lungs: CTA b/l Abd: soft, non tender, BS + Ext: trace b/l LE edema  Assessment and Plan: 59 y/o female with mild COPD exacerbation. She received 1 dose of IV solumedrol yesterday and duoneb treatments. She was also started on levaquin PO. She has improved and feels back to baseline. Will c/w prednisone and po abx and d/c home today.   Continue with other medical management per Dr. Bebe Shaggy note.

## 2014-07-18 ENCOUNTER — Other Ambulatory Visit: Payer: Self-pay

## 2014-07-18 NOTE — Patient Outreach (Signed)
[facility]) Care Management  07/18/2014  [name] May 11, 1955 [id number]   CSW called patient to verify interest in receiving services through [facility]. CSW verified that patient was interested in doing a financial assessment. CSW made plan with patient that a SW would follow-up with her next week to schedule a Financial assessment. Patient reports trouble affording specialist copays and reports she doesn't think she can qualify for Medicaid. Patient wished to wait until next week to follow-up with SW to scheduled financial assessment.

## 2014-07-21 ENCOUNTER — Other Ambulatory Visit: Payer: Self-pay | Admitting: *Deleted

## 2014-07-21 NOTE — Patient Outreach (Addendum)
Referral received through care manager assistant, L. Laurance Flatten, from insurance company regarding the possibility of providing services to this member.  Member was recently admitted to hospital on 07/15/14 and discharged on 07/16/14 with a diagnosis of COPD exacerbation.  She also has a history of CHF.    Placed call to member to initiate transition of care program.  Identity verified, [facility] services explained.  Member states that she feels that she does not need any further assistance at this time, stating that her biggest concern was being able to afford her medications.  She reports that she now is in a pharmacy program where she can afford her medications.  She is unable to name the program, but states that her medications are much more affordable now and will not have any problems taking them.  Services offered again, including the transition of care program.  She states that she is ok with doing the transition of care program, but that is the only program she is willing to be involved in.    The member reports that she fell over a week ago (prior to going into the hospital) and injured her leg.  She states that she reported this when she was admitted, but states that she has had no testing done on the injury.  She reports that she continues to have some pain in that area, and has swelling and bruising.  Encouraged to voice concerns to primary care physician during follow up visit on this Wednesday.  Member also states that she has a follow up visit with her cardiologist on next week.  Member reports that she already has transportation set up for these visits.  Member states that she is a current smoker, smoking between a half pack to a pack of cigarettes every day.  She does not have a desire to quit, stating that she has been smoking for the past 45 years and does not want to stop.  Member also reports to being on 4-5 liters of oxygen continuously.  Dangers of smoking with oxygen discussed, member states tht she  is careful and removes her oxygen when smoking.    Member denies any further questions or concerns at this time.  Provided with this care manager's contact information.  Will place transition of care call next week.    Valente David, BSN, [facility]  [facility] Care Manager [phone]

## 2014-07-23 ENCOUNTER — Other Ambulatory Visit: Payer: Self-pay | Admitting: Pharmacist

## 2014-07-23 ENCOUNTER — Ambulatory Visit (INDEPENDENT_AMBULATORY_CARE_PROVIDER_SITE_OTHER): Payer: Commercial Managed Care - HMO | Admitting: Internal Medicine

## 2014-07-23 ENCOUNTER — Telehealth: Payer: Self-pay | Admitting: *Deleted

## 2014-07-23 ENCOUNTER — Encounter: Payer: Self-pay | Admitting: Internal Medicine

## 2014-07-23 VITALS — BP 118/63 | HR 84 | Temp 98.2°F | Wt 261.0 lb

## 2014-07-23 DIAGNOSIS — M25561 Pain in right knee: Secondary | ICD-10-CM

## 2014-07-23 DIAGNOSIS — Z9981 Dependence on supplemental oxygen: Secondary | ICD-10-CM | POA: Diagnosis not present

## 2014-07-23 DIAGNOSIS — F17210 Nicotine dependence, cigarettes, uncomplicated: Secondary | ICD-10-CM

## 2014-07-23 DIAGNOSIS — E785 Hyperlipidemia, unspecified: Secondary | ICD-10-CM | POA: Diagnosis not present

## 2014-07-23 DIAGNOSIS — F17200 Nicotine dependence, unspecified, uncomplicated: Secondary | ICD-10-CM

## 2014-07-23 DIAGNOSIS — J449 Chronic obstructive pulmonary disease, unspecified: Secondary | ICD-10-CM

## 2014-07-23 DIAGNOSIS — R6889 Other general symptoms and signs: Secondary | ICD-10-CM | POA: Diagnosis not present

## 2014-07-23 NOTE — Assessment & Plan Note (Signed)
We extensively discussed smoking cessation today. She continues to smoke 1/2 pack per day. Patient has no desire to quit because she "doesn't see the point" and that she just has a "bad habit." She states she cannot quit because she gets intense cravings for cigarettes. She has tried nicotine patches in the past with no success. She does not wish to try the patches again or Chantix. I encouraged her to engage in activities that she enjoys when she starts to get a craving. She states she cannot do anything because she is "tied to the chair and oxygen tank." I suspect that there is underlying depression and patient did not wish to discuss this possibility.  - Continue to encourage smoking cessation

## 2014-07-23 NOTE — Assessment & Plan Note (Signed)
Patient completed Levaquin and Prednisone for her acute exacerbation. She appears to be doing better. No wheezing on exam. We extensively discussed smoking cessation but patient has no desire to quit. Unfortunately, her respiratory status will continue to decline as she continues to smoke and refuses pulmonary rehab.  - Continue home oxygen therapy - Continue albuterol inhaler and nebs - Continue Spiriva and Advair - Continue to encourage smoking cessation  - Close follow up to prevent rehospitalizations

## 2014-07-23 NOTE — Telephone Encounter (Signed)
Spoke w/ dr rivet after a call from [facility] pharmacist, dr rivet instructs that lovastatin be increased to 2 daily= 30m. Pt was instructed to do so, understands and f/u appt is made for 5/27 w/ dr kEppie Gibson

## 2014-07-23 NOTE — Progress Notes (Signed)
Internal Medicine Clinic Attending  Case discussed with Dr. Arcelia Jew at the time of the visit.  We reviewed the resident's history and exam and pertinent patient test results.  I agree with the assessment, diagnosis, and plan of care documented in the resident's note.

## 2014-07-23 NOTE — Patient Outreach (Signed)
Called [name] physician, Dr. Arcelia Jew, to verify if should be taking a higher dose of lovastatin while she uses up this prescription in order to achieve greater LDL lowering until she starts on the atorvastatin. Provider agreed with this recommendation to increase her lovastatin dose. Per Dr. Arcelia Jew, patient should start taking lovastatin 2 tablets (40 mg) daily at bedtime until she finishes the medication that she has and then start on the atorvastatin. Dr. Arcelia Jew reports that she will also reevaluate at the patient's next visit to consider increasing the lovastatin dose further. Verified that physician's office will call patient to notify her of this new change in dosing.  Harlow Asa, PharmD Clinical Pharmacist [facility] [phone]

## 2014-07-23 NOTE — Progress Notes (Signed)
Subjective:    Patient ID: [name], female    DOB: [birth year], 59 y.o.   MRN: [id number]  HPI [name] is a 59 yo woman with PMHx of COPD on home oxygen, pulmonary HTN, chronic diastolic HF, hyperlipidemia, and tobacco abuse who presents today for a hospital follow up. Patient was hospitalized from 4/12-4/13 for an acute on chronic COPD exacerbation. She improved after being started on antibiotics and steroids. She was discharged on Levaquin 750 mg daily for 3 days and Prednisone 40 mg daily for 3 days. She completed both therapies. Today, she reports she is feeling better than when she was in the hospital. She notes some wheezing and cough (sometimes productive with yellow sputum), but this is normal for her. She is still smoking 1/2 pack per day. She does not wish to quit as she "doesn't see the point." She also describes getting intense cravings which prevents her from quitting.   She also notes she fell about 1 week ago while walking to the bathroom. She reports she fell on her right knee and has bruising on her right foot and outer lower leg. She notes associated swelling and pain that has not been relieved with naproxen that she received in the hospital. She also has a cut on her right knee for which she denies any purulent drainage.    Review of Systems General: Denies fever, chills, night sweats, changes in weight, changes in appetite HEENT: Denies headaches, ear pain, changes in vision, rhinorrhea, sore throat CV: Denies CP, palpitations, orthopnea Pulm: See above GI: Denies abdominal pain, nausea, vomiting, diarrhea, constipation, melena, hematochezia GU: Denies dysuria, hematuria, frequency Msk: Denies muscle cramps Neuro: Denies weakness, numbness, tingling Skin: Denies rashes    Objective:   Physical Exam General: appears chronically ill, sitting up in chair HEENT: [place]/AT, EOMI, mucus membranes moist CV: RRR, no m/g/r Pulm: CTA bilaterally, no wheezing, breaths  non-labored on oxygen via [place] Abd: BS+, soft, obese, non-tender Ext: warm, trace edema bilaterally. Right knee has a small lesion across the anterior aspect. Mild erythema present surrounding lesion. No purulent drainage. There is bruising along the lateral side of the right leg and across the dorsum of the right foot. The right foot appears swollen compared to left.  Neuro: alert and oriented x 3, no focal deficits    Assessment & Plan:  Refer to A&P documentation.

## 2014-07-23 NOTE — Assessment & Plan Note (Signed)
Last lipid panel 11/2012 with total cholesterol 237, LDL 136, HDL 30, triglyceride 356. She is currently taking lovastatin 20 mg daily. Given patient's multiple risk factors she should be on a high intensity statin. Patient states she just refilled her lovastatin for a 3 month supply. We agreed to switch to atorvastatin 40 mg daily once she runs out of lovastatin.  - Switch to atorvastatin 40 mg daily when out of lovastatin

## 2014-07-23 NOTE — Patient Outreach (Signed)
Called to speak with patient's local pharmacy, [facility], about patient's report that she is unable to get her albuterol, either Proventil or Proair, inhaler without a prior authorization. Per [facility] Medicare formulary, Ventolin HFA inhaler is their covered albuterol inhaler option. Notified the pharmacist, Ulice Dash, at [facility] and asked him to resubmit the claim, dispensing the Ventolin HFA. Ulice Dash reported that the claim went through with a copay of $7.40. Called to notify patient that the claim went through, of the copay amount and that the pharmacy will be getting her prescription ready.  Harlow Asa, PharmD Clinical Pharmacist [facility] [phone]

## 2014-07-23 NOTE — Patient Instructions (Addendum)
It was a pleasure seeing you today, [name]   1. COPD - Continue oxygen therapy - Continue albuterol, Spiriva, Advair  2. Smoking - Quitting smoking is the best thing you can do for your COPD - Please consider trying nicotine patches or gum, or Chantix - Try to find activities that you enjoy doing that you can turn to when you have a craving  3. High cholesterol - Continue lovastatin 20 mg daily for now - We will switch to Atorvastatin 40 mg daily when you run out  4. Knee Pain - Use neosporin on your cut - Wash daily with soap and water - Try tylenol, advil for pain  General Instructions:   Please bring your medicines with you each time you come to clinic.  Medicines may include prescription medications, over-the-counter medications, herbal remedies, eye drops, vitamins, or other pills.   Progress Toward Treatment Goals:  Treatment Goal 02/10/2014  Stop smoking smoking the same amount  Prevent falls -    Self Care Goals & Plans:  Self Care Goal 07/23/2014  Manage my medications take my medicines as prescribed; bring my medications to every visit; refill my medications on time  Monitor my health keep track of my weight  Eat healthy foods eat more vegetables; eat foods that are low in salt; eat baked foods instead of fried foods; eat smaller portions  Be physically active find an activity I enjoy  Stop smoking cut down the number of cigarettes smoked; set a quit date and stop smoking  Prevent falls have my vision checked; wear appropriate shoes    No flowsheet data found.   Care Management & Community Referrals:  Referral 04/24/2013  Referrals made for care management support [facility] case management program  Referrals made to community resources transportation

## 2014-07-23 NOTE — Assessment & Plan Note (Signed)
Patient reports she had a fall about 1 week ago and she fell on her right knee. She states she was given naproxen in the hospital but this did not help her pain. She does have a laceration on her right anterior knee which is not draining purulent fluid. There is mild erythema surrounding the lesion, but does not appear cellulitic. She has some bruising on the lateral aspect of her right leg and her right foot.  - Recommended using neosporin on laceration and cleaning with soap and water daily - ibuprofen, tylenol for pain - Recommended to come back to clinic if not better in the next few weeks

## 2014-07-23 NOTE — Patient Outreach (Addendum)
[name] was referred for pharmacy to provide assistance with obtaining her albuterol inhaler. Spoke with patient. [name] reported that the pharmacy told her that the claim needs a prior authorization and that this process would need to be completed by her physician. She states that she received a letter in the mail stating that neither ProAir and Proventil are covered by her insurance and it is her understanding that this is why she requires a prior authorization.   Per the [facility] Medicare formulary webiste, Ventolin HFA inhaler is their covered albuterol inhaler option. Will contact the patient's local pharmacy, [facility], to have them try this option.  Patient also reported that she had been told by her physician to stop her lovastatin and start atorvastatin for her cholesterol. Patient reports that she told her physician that she had just received a 90 day supply of lovastatin in the mail and did not want to waste it. Per patient, physician told her to continue the lovastatin 20 mg once daily until gone and then start the atorvastatin 40 mg. Explained to [name] that atorvastatin is a stronger statin meant to lower her bad cholesterol more than her current lovastatin therapy. Will call [name] physician to suggest a higher dose of lovastatin while she uses up this prescription in order to achieve greater LDL lowering until she starts on the atorvastatin.  Harlow Asa, PharmD Clinical Pharmacist [facility] [phone]

## 2014-07-25 ENCOUNTER — Encounter: Payer: Self-pay | Admitting: *Deleted

## 2014-07-25 ENCOUNTER — Other Ambulatory Visit: Payer: Self-pay | Admitting: *Deleted

## 2014-07-25 NOTE — Patient Outreach (Signed)
[facility]) Care Management  [facility] Social Work  07/25/2014  [name] May 27, 1955 [id number]  Subjective:    Patient lacks financial resources to pay monthly expenses, as well as afford medical procedures.   Current Medications:  Current Outpatient Prescriptions  Medication Sig Dispense Refill  . albuterol (PROVENTIL HFA;VENTOLIN HFA) 108 (90 BASE) MCG/ACT inhaler Inhale 2 puffs into the lungs every 6 (six) hours as needed for wheezing or shortness of breath. 1 Inhaler 2  . albuterol (PROVENTIL) (2.5 MG/3ML) 0.083% nebulizer solution Take 3 mLs (2.5 mg total) by nebulization every 6 (six) hours as needed for wheezing or shortness of breath. 75 mL 1  . aspirin 81 MG chewable tablet Chew 1 tablet (81 mg total) by mouth daily. (Patient taking differently: Chew 81 mg by mouth at bedtime. ) 30 tablet 11  . Fluticasone-Salmeterol (ADVAIR DISKUS) 250-50 MCG/DOSE AEPB Inhale 1 puff into the lungs 2 (two) times daily. 60 each 5  . furosemide (LASIX) 40 MG tablet Take 1 tablet (40 mg total) by mouth daily. Please note higher strength tablet.  Only take 1 tablet daily. 30 tablet 2  . lovastatin (MEVACOR) 20 MG tablet Take 1 tablet (20 mg total) by mouth at bedtime. 90 tablet 3  . pantoprazole (PROTONIX) 40 MG tablet Take 1 tablet (40 mg total) by mouth daily. 30 tablet 0  . Polyvinyl Alcohol-Povidone (TEARS PLUS OP) Place 1 drop into both eyes daily as needed (dry eyes).    Marland Kitchen senna-docusate (SENOKOT-S) 8.6-50 MG per tablet Take 2 tablets by mouth at bedtime. (Patient not taking: Reported on 07/21/2014) 60 tablet 0  . tiotropium (SPIRIVA HANDIHALER) 18 MCG inhalation capsule Place 1 capsule (18 mcg total) into inhaler and inhale daily. 30 capsule 12   No current facility-administered medications for this visit.    Functional Status:  In your present state of health, do you have any difficulty performing the following activities: 07/25/2014 07/23/2014  Hearing? - N  Vision? - N   Difficulty concentrating or making decisions? - N  Walking or climbing stairs? - N  Dressing or bathing? - N  Doing errands, shopping? - N  Conservation officer, nature and eating ? N -  Using the Toilet? N -  In the past six months, have you accidently leaked urine? N -  Do you have problems with loss of bowel control? N -  Managing your Medications? N -  Managing your Finances? N -  Housekeeping or managing your Housekeeping? N -    Fall/Depression Screening:  PHQ 2/9 Scores 07/23/2014 04/10/2014 02/10/2014 01/22/2014 12/02/2013 08/07/2013 06/12/2013  PHQ - 2 Score 0 1 0 2 0 0 0  PHQ- 9 Score - - 0 11 - - -    Assessment:   CSW was able to make initial contact with patient today to perform phone assessment.  CSW introduced self, explained role and types of services provided through [employer].  CSW further explained to patient that CSW works with patients RNCM, Valente David, also with Scientist, clinical (histocompatibility and immunogenetics).  Last, CSW explained to patient the reason for the call, indicating that CSW is aware of patients request for financial assistance, based on the referral CSW received while patient was recently hospitalized.  Before proceeding any further with the conversation, CSW was able to get patient to verbally consent to converse with CSW, as well as allow CSW to make referrals to community agencies and resources, if necessary.  Patient then provided two HIPAA  compliant identifiers for CSW, which included her full name and date of birth.  Patient admitted to [place] that she has been trying to obtain financial assistance through various agencies and resources; however, she is always told that she is overqualified.  CSW inquired as to whether or not patient would be interested in having CSW assist her with completion of an FAF (Financial Assessment Form) to see if patient is eligible for financial assistance through [facility].  At that point,  patient admitted to [place] that her [place] well-exceeds the [place] for fiscal year 2016.  Patient went on to say that she has been living with her female friend for 12 plus years and that all of the bills are in his name.  Patient reported that the only bill that she currently has in her name is her cell phone service.  CSW agreed to provide patient with a complete listing of all community agencies and resources in [facility] that may be able to offer financial assistance, at least for future reference.  In addition, CSW agreed to mail patient an Adult Medicaid application through the [place], as patient would like to see if she is eligible to have her Medicare premiums covered through [place].  Last, CSW will mail patient a complete list of Food Banks/Food Pantries and Soup Kitchens in [place] and [place], per patients request.  CSW inquired as to how CSW could be of further assistance to patient at this time.  Patient denied being able to identify any additional social work needs at present.  CSW provided patient with CSW's contact information, encouraging patient to contact CSW directly if social work needs arise in the near future.  Patient voiced understanding and was agreeable to this plan.  CSW will converse with Mrs. Lane to report findings of phone conversation with patient today.  CSW will also notify patients Primary Care Physician, Dr. Oval Linsey of CSW's involvement with patient.  Plan:   CSW will no longer attempt to try and make contact with patient, either by phone or in person, as all goals of treatment have been met from social work standpoint and no additional social work needs have been identified at this time. CSW will mail a packet of information to patients home for her review, which will include all of the following information: Adult Medicaid application, Food Banks/Food Pantries and [employer] in [place] and a list of all community agencies and resources in [place] that may be able to assist with finances. CSW will submit a case closure request to Lurline Del, Care Management Assistant with [facility], in the form of an In [employer], ensuring that Mrs. Laurance Flatten is aware of Monica Lane's (RNCM with [facility]) continued involvement with patient for disease management services.   Nat Christen, BSW, MSW, [facility] [place], [place], [place] [address] [email or web address] [phone]

## 2014-07-30 ENCOUNTER — Other Ambulatory Visit: Payer: Self-pay | Admitting: *Deleted

## 2014-07-30 ENCOUNTER — Encounter: Payer: Self-pay | Admitting: Cardiology

## 2014-07-30 ENCOUNTER — Ambulatory Visit (INDEPENDENT_AMBULATORY_CARE_PROVIDER_SITE_OTHER): Payer: Commercial Managed Care - HMO | Admitting: Cardiology

## 2014-07-30 VITALS — BP 128/64 | HR 84 | Ht 65.0 in | Wt 262.6 lb

## 2014-07-30 DIAGNOSIS — E785 Hyperlipidemia, unspecified: Secondary | ICD-10-CM | POA: Diagnosis not present

## 2014-07-30 DIAGNOSIS — J449 Chronic obstructive pulmonary disease, unspecified: Secondary | ICD-10-CM | POA: Diagnosis not present

## 2014-07-30 DIAGNOSIS — I2510 Atherosclerotic heart disease of native coronary artery without angina pectoris: Secondary | ICD-10-CM | POA: Diagnosis not present

## 2014-07-30 DIAGNOSIS — I5032 Chronic diastolic (congestive) heart failure: Secondary | ICD-10-CM | POA: Diagnosis not present

## 2014-07-30 DIAGNOSIS — R6889 Other general symptoms and signs: Secondary | ICD-10-CM | POA: Diagnosis not present

## 2014-07-30 NOTE — Patient Outreach (Signed)
Call placed to member for follow up from discharge last week for the transition of care program.  Member verifies date of birth and address, and reports that she is "doing all right."  Member asked about daily weights, and the member reports that she just haven't gotten around to doing it.  This care manager asked if she could attempt to weigh herself on a daily basis for monitor fluid levels and weight gain.  She states "probably not."  Discussed the importance of monitoring fluid levels and weight given her diagnosis of CHF and taking Lasix, she states "they have been trying to get me to do that for years and I just haven't done it."  Member again states that she has a scale, but won't weigh herself.  Member states that she has been having trouble contacting her mail order pharmacy.  She has been trying to contact them to inform them not to send her current prescription of Lovastatin because the physician is going to change the medication to Atorvastatin.  She reports that the mail order pharmacy is through [place].  Informed member that this care manager would contact [facility] and inform them of this difficulty.  This care manager will also inform the [facility] care manager of the difficulty getting member to fully engage with the [facility] program.  Member denies any further questions.  Encouraged to voice concerns with this care manager.  Will place transition of care call next week.  Valente David, BSN, [facility]  [facility] Care Manager [phone]

## 2014-07-30 NOTE — Patient Instructions (Signed)
Your physician recommends that you schedule a follow-up appointment in: 3 Months with dr Percival Spanish  Your Physician recommends you follow-up with a Pulmonary Doctor

## 2014-07-30 NOTE — Patient Outreach (Signed)
Call placed to A. Coley from [place] to discuss member's involvement with [facility] and to inform her of member's concerns regarding mail order prescription for Lovastatin.  No answer, HIPPA compliant voice message left.  Will await a call back.  Valente David, BSN, [place] [facility]  [facility] Care Manager [phone]

## 2014-07-30 NOTE — Progress Notes (Signed)
Cardiology Office Note   Date:  07/30/2014   ID:  ELEEN LITZ, DOB Dec 15, 1955, MRN [id number]  PCP:  Karren Cobble, MD  Cardiologist:  Dr. Percival Spanish    Chief Complaint  Patient presents with  . Hospitalization Follow-up    pt denied chest pain      History of Present Illness: LASHAWNA [name] is a 59 y.o. female who presents for post hospital visit from 07/01/14 for chest pain with negative nuc.  Also with acute on chronic systolic HF.   Readmitted 07/15/14 with COPD exacerbation.  Since that admit she has done well on home oxygen.  She was recommended to follow up with Pulmonary for fine tuning of her COPD.  She is very short of breath with exertion. She has some chest pressure but is never sure due to lungs or heart.     Other hx of LAD PCI w/ RCA 100% 2010, PAH, borderline DM, tob use, D-CHF.  Her mevacor is being changed to zocor 40 per her PCP.   Lipid Panel     Component Value Date/Time   CHOL 237* 11/09/2012 0447   TRIG 356* 11/09/2012 0447   HDL 30* 11/09/2012 0447   CHOLHDL 7.9 11/09/2012 0447   VLDL 71* 11/09/2012 0447   LDLCALC 136* 11/09/2012 0447    Wt is up 14 lbs since discharge.  Her rt lower ext edematous today but she has not taken her lasix today.  She stated when she takes she does not have that much edema.      Past Medical History  Diagnosis Date  . Coronary artery disease     S/P PCI of LAD with DES (12/2008). Total occlusion of RCA noted at that time., medically managed. ACS ruled out 03/2009 with Lexiscan myoview . Followed by [place].  . Pulmonary hypertension     2-D Echo (67/0110) - Systolic pressure was moderately increased. PA peak pressure  51mHg. 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 Followed by Dr. WJoya Gaskins(Velora Heckler  . Diastolic dysfunction     2-D Echo (12/2008) - Normal LV Systolic funciton with EF 60-65%. Grade 1 diastolid  dysfunction. No regional wall motion abnormalities. Moderate pulmonary HTN with PA peak pressure 584mg.  . Marland KitchenOPD (chronic obstructive pulmonary disease)     Severe. Gold Stage IV.  PFTs (12/2008) - severe obstructive airway disease. Active tobacco use. Requires 4L O2 at home.  . Pulmonary nodule, right     Small right middle lobe nodule. Stable as of 12/2008.  . Marland Kitchenrediabetes 12/2008    HgbA1c 6.4 (12/2008)  . Hx MRSA infection     Recurrent MRSA thigh abscesses.  . Tobacco abuse     Ongoing.  . Obesity   . Hyperlipidemia   . GERD (gastroesophageal reflux disease)     S/P Nissen fundoplication.  . CHF (congestive heart failure)   . Shortness of breath dyspnea     Past Surgical History  Procedure Laterality Date  . Total abdominal hysterectomy w/ bilateral salpingoophorectomy    . Nissen fundoplication    . Right heart catheterization N/A 11/09/2012    Procedure: RIGHT HEART CATH;  Surgeon: DaLarey DresserMD;  [facility];  Service: Cardiovascular;  Laterality: N/A;     Current Outpatient Prescriptions  Medication Sig Dispense Refill  . albuterol (PROVENTIL HFA;VENTOLIN HFA) 108 (90 BASE) MCG/ACT inhaler Inhale 2 puffs into the lungs  every 6 (six) hours as needed for wheezing or shortness of breath. 1 Inhaler 2  . albuterol (PROVENTIL) (2.5 MG/3ML) 0.083% nebulizer solution Take 3 mLs (2.5 mg total) by nebulization every 6 (six) hours as needed for wheezing or shortness of breath. 75 mL 1  . aspirin 81 MG chewable tablet Chew 1 tablet (81 mg total) by mouth daily. (Patient taking differently: Chew 81 mg by mouth at bedtime. ) 30 tablet 11  . Fluticasone-Salmeterol (ADVAIR DISKUS) 250-50 MCG/DOSE AEPB Inhale 1 puff into the lungs 2 (two) times daily. 60 each 5  . furosemide (LASIX) 40 MG tablet Take 1 tablet (40 mg total) by mouth daily. Please note higher strength tablet.  Only take 1 tablet daily. 30 tablet 2  . lovastatin (MEVACOR) 20 MG tablet Take 1 tablet (20 mg total)  by mouth at bedtime. 90 tablet 3  . naproxen (NAPROSYN) 500 MG tablet Take 1 tablet by mouth as needed.  0  . pantoprazole (PROTONIX) 40 MG tablet Take 1 tablet (40 mg total) by mouth daily. 30 tablet 0  . Polyvinyl Alcohol-Povidone (TEARS PLUS OP) Place 1 drop into both eyes daily as needed (dry eyes).    Marland Kitchen tiotropium (SPIRIVA HANDIHALER) 18 MCG inhalation capsule Place 1 capsule (18 mcg total) into inhaler and inhale daily. 30 capsule 12   No current facility-administered medications for this visit.    Allergies:   Fluconazole    Social History:  The patient  reports that she has been smoking Cigarettes.  She has a 20 pack-year smoking history. She has never used smokeless tobacco. She reports that she does not drink alcohol or use illicit drugs.   Family History:  The patient's family history includes Heart disease (age of onset: 55) in her mother; Heart disease (age of onset: 25) in her father; Hypertension in her brother, father, and mother; Lung cancer in an other family member.    ROS:  General:no colds or fevers,  weight increase but did not take her lasix today Skin:no rashes or ulcers HEENT:no blurred vision, no congestion CV:see HPI PUL:see HPI GI:no diarrhea constipation or melena, no indigestion GU:no hematuria, no dysuria MS:no joint pain, no claudication Neuro:no syncope, no lightheadedness Endo:no diabetes, no thyroid disease  Wt Readings from Last 3 Encounters:  07/30/14 262 lb 9.6 oz (119.115 kg)  07/23/14 261 lb (118.389 kg)  07/01/14 248 lb 3.2 oz (112.583 kg)     PHYSICAL EXAM: VS:  BP 128/64 mmHg  Pulse 84  Ht 5' 5" (1.651 m)  Wt 262 lb 9.6 oz (119.115 kg)  BMI 43.70 kg/m2 , BMI Body mass index is 43.7 kg/(m^2). General:Pleasant affect, NAD Skin:Warm and dry, brisk capillary refill HEENT:normocephalic, sclera clear, mucus membranes moist Neck:supple, no JVD, no bruits  Heart:S1S2 RRR without murmur, gallup, rub or click Lungs:dimiinshed breath sounds  without rales, rhonchi, or wheezes SWH:QPRF, non tender, + BS, do not palpate liver spleen or masses Ext:+ Rt  lower ext edema to her knee, , has small laceration on Rt knee healing and bruising around rt foot ankle, all of this from a fall., 2+ radial pulses Neuro:alert and oriented X 3, MAE, follows commands, + facial symmetry    EKG:  EKG is ordered today. The ekg ordered today demonstrates SR at 57 with RVH and no acute changes.     Recent Labs: 01/27/2014: TSH 2.060 03/18/2014: Pro B Natriuretic peptide (BNP) 400.4* 05/22/2014: ALT 8 05/23/2014: Magnesium 1.4* 07/15/2014: B Natriuretic Peptide 499.2* 07/16/2014: BUN 16; Creatinine  0.85; Hemoglobin 13.3; Platelets 177; Potassium 4.9; Sodium 141    Lipid Panel    Component Value Date/Time   CHOL 237* 11/09/2012 0447   TRIG 356* 11/09/2012 0447   HDL 30* 11/09/2012 0447   CHOLHDL 7.9 11/09/2012 0447   VLDL 71* 11/09/2012 0447   LDLCALC 136* 11/09/2012 0447       Other studies Reviewed: Additional studies/ records that were reviewed today include:hospital notes and labs..   ASSESSMENT AND PLAN:  Chest pain in March - ez neg MI, negative myoview for ischemia - known RCA occlusion, treated medically - pt does not routinely get chest pain, this was unusual - on ASA, statin She will follow up with Dr. Vita Barley in 3 months though instructed to call if increasing chest pain.  Active Problems:  TOBACCO ABUSE - per IM, cessation encouraged   Coronary atherosclerosis - previous PCI to LAD in 2010   Severe chronic obstructive pulmonary disease - per PCP     Hyperlipidemia - on statin, mevacor changed to zocor.   Acute on chronic diastolic CHF (congestive heart failure) - Right heart catheterization August 2014 showed PA pressure 49/27 and pulmonary capillary wedge pressure 11.     Current medicines are reviewed with the patient today.  The patient Has no concerns regarding medicines.  The following changes  have been made:  See above Labs/ tests ordered today include:see above  Disposition:   FU:  see above  Signed, Isaiah Serge, NP  07/30/2014 2:12 PM    [facility], [place], [place], [place] Phone: [phone]; Fax: [phone]

## 2014-07-31 ENCOUNTER — Other Ambulatory Visit: Payer: Self-pay | Admitting: Internal Medicine

## 2014-08-01 ENCOUNTER — Other Ambulatory Visit: Payer: Self-pay | Admitting: *Deleted

## 2014-08-01 NOTE — Patient Outreach (Signed)
Call placed to [facility] [place], to discuss the member's case and her concerns.  No answer, HIPPA compliant voice message left.  Will await for call back.  Valente David, BSN, [place] Management  [facility] Care Manager [phone]

## 2014-08-01 NOTE — Patient Outreach (Signed)
Call received back from [facility], [place].  This care manager informed her of member's decision to only take advantage of the transition of care program and that the member declined any further involvement with [facility] once this was complete.  Also informed Ms. Coley of member's inquire of medications being sent from mail order pharmacy.  Ms. Lenon Curt informed this care manager that she is not involved with the mail order pharmacy in any way.  Contact number for [facility] pharmacy provided, will contact member to assure that she has the correct number.    Also discussed with Ms. Coley the involvement with [facility] pharmacy and Education officer, museum.  Updated on social worker closure due to member not being eligible for resources.  Ms. Lenon Curt aware of the plan to close case once the transition of care program is complete.    Valente David, BSN, [place] Management  [facility] Care Manager [phone]

## 2014-08-05 DIAGNOSIS — J9600 Acute respiratory failure, unspecified whether with hypoxia or hypercapnia: Secondary | ICD-10-CM | POA: Diagnosis not present

## 2014-08-05 DIAGNOSIS — J449 Chronic obstructive pulmonary disease, unspecified: Secondary | ICD-10-CM | POA: Diagnosis not present

## 2014-08-05 DIAGNOSIS — R0902 Hypoxemia: Secondary | ICD-10-CM | POA: Diagnosis not present

## 2014-08-08 ENCOUNTER — Other Ambulatory Visit: Payer: Self-pay | Admitting: *Deleted

## 2014-08-08 NOTE — Patient Outreach (Signed)
Call placed to member for transition of care.  No answer, HIPPA compliant voice message left.  Will await for call back.  Will try again next week.  Valente David, BSN, [place] Management  [facility] Care Manager [phone]

## 2014-08-15 ENCOUNTER — Other Ambulatory Visit: Payer: Self-pay | Admitting: *Deleted

## 2014-08-15 NOTE — Patient Outreach (Signed)
Call placed to member for transition of care.  Member states that she is doing fine and is still able to care for herself.  Member again denies weighing self on a regular basis, stating "I just take my fluid pill like they told me to and keep on going."  Services beyond transition of care program again offered, but member declines offer.  Member made aware that transition of care program will be complete next week and that her case will be closed at that time.  Member states "that is fine."  Member made aware that if anything changes and she is in need of any resources or [facility] services, she will be able to contact the main office for assistance.  Member verbalizes understanding.    Will call member next week for final transition of care call.  Valente David, BSN, [place] Management  [facility] Care Manager [phone]

## 2014-08-18 IMAGING — CR DG CHEST 2V
2 series · 2 of 2 positions shown · non-contrast
Comparison: Radiographs 05/12/2011.  CT 09/07/2010.

CLINICAL DATA: Chest tightness with shortness of breath for 2 days.
Smoker.

CHEST - 2 VIEW

[w chest pa]
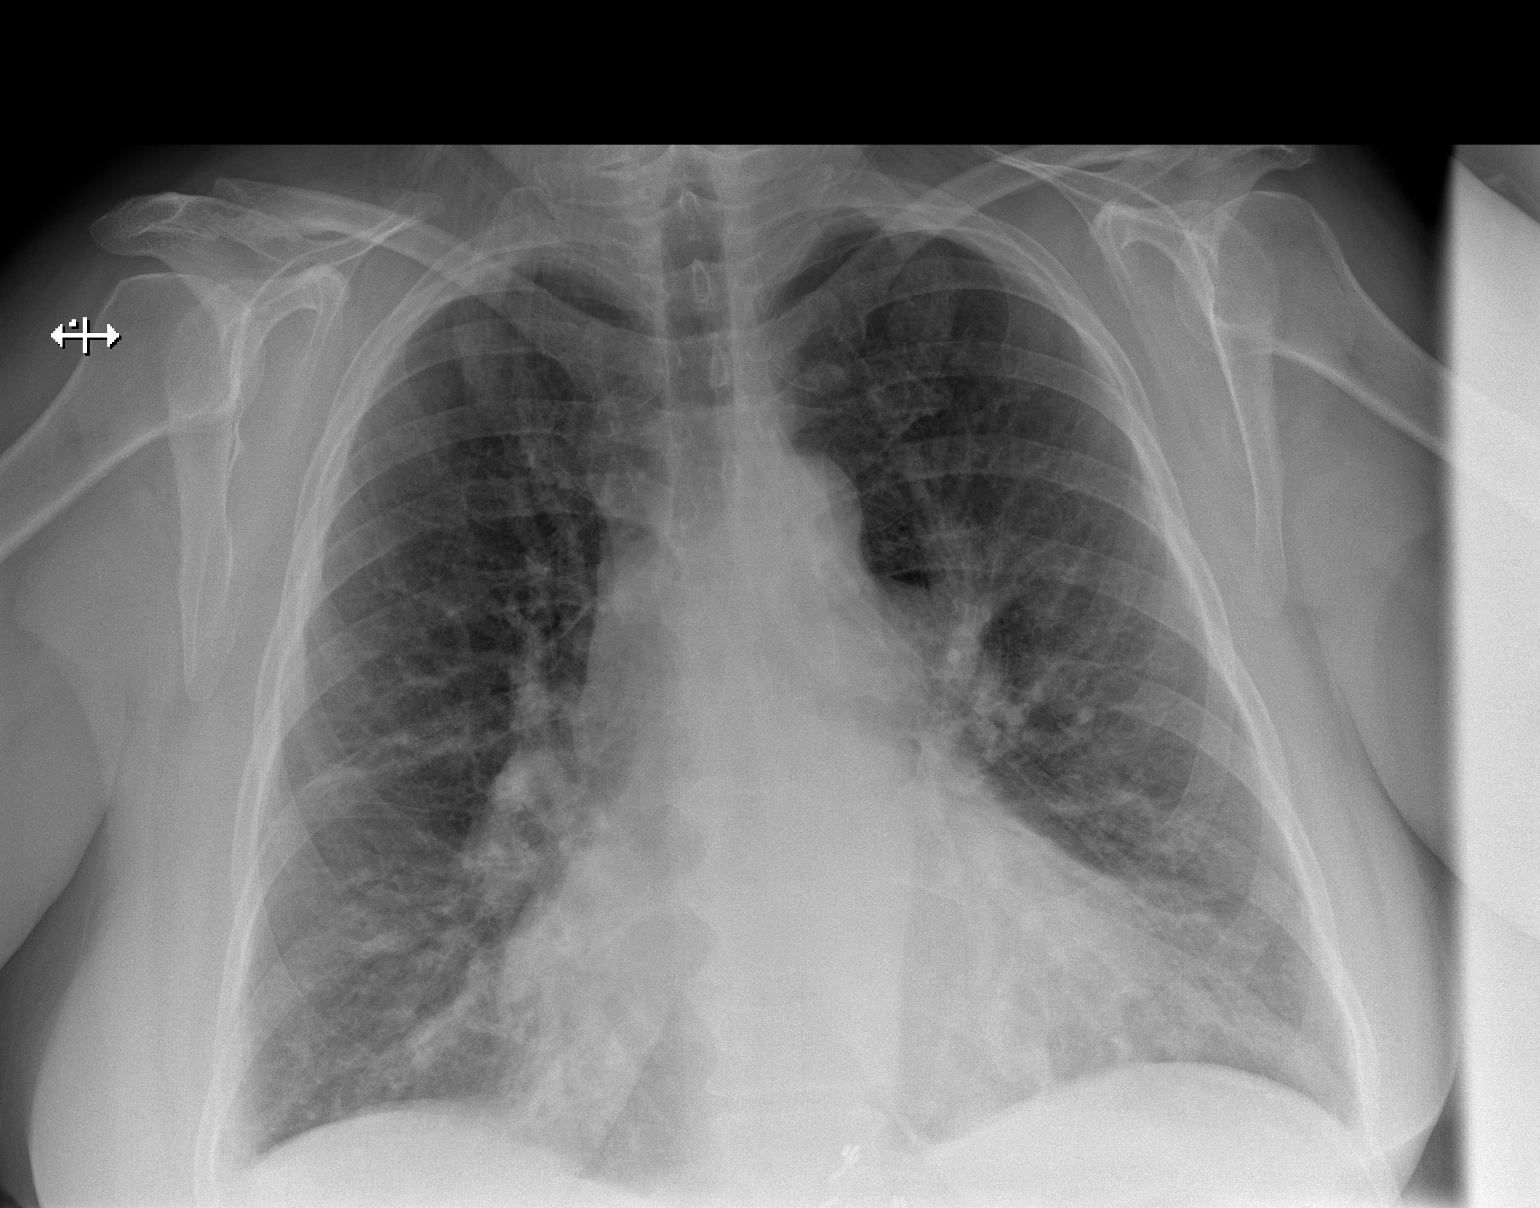

[w chest lat]
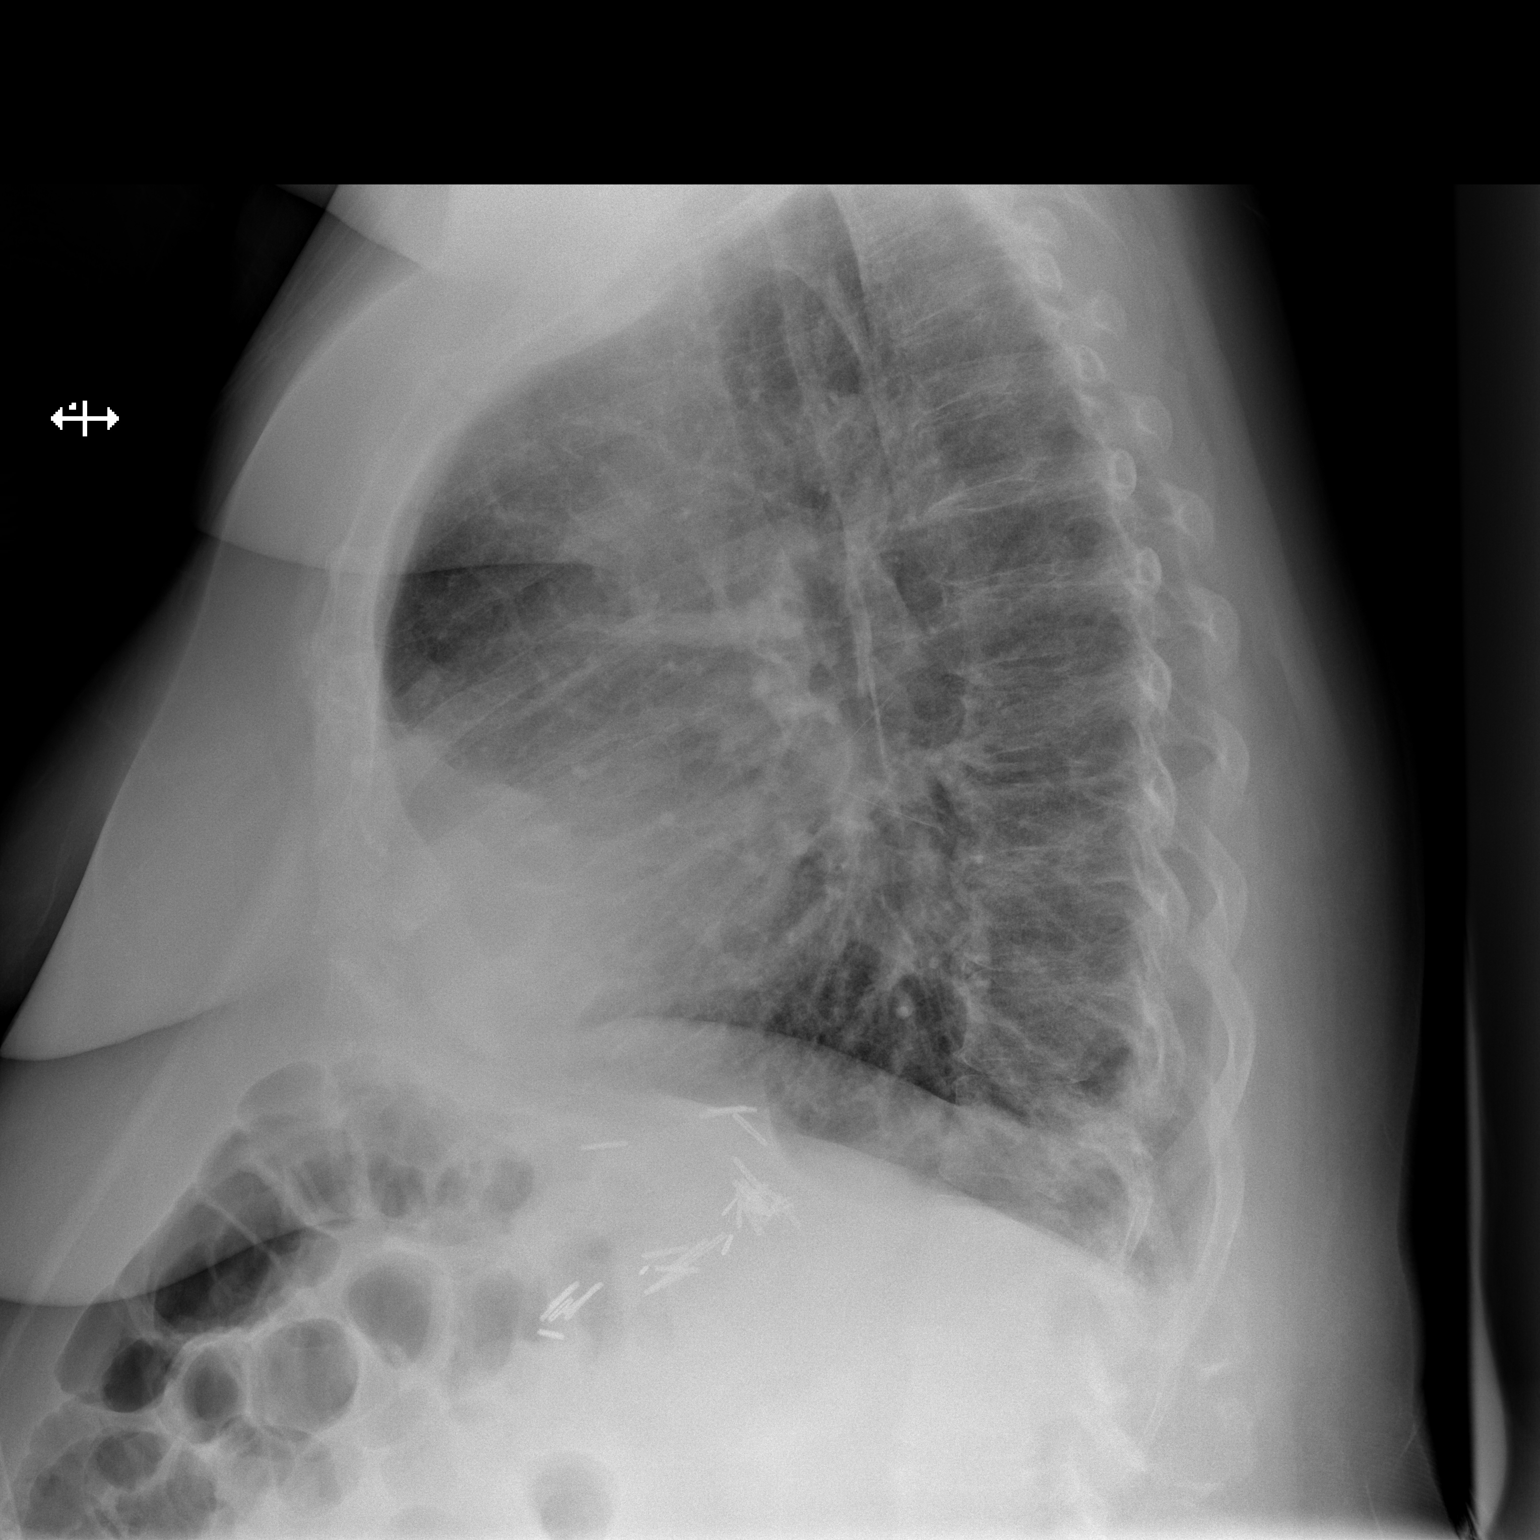

[2 of 2 positions shown; findings below may reference images not displayed]

FINDINGS: There is stable cardiac enlargement.  Central airway
thickening and infrahilar scarring bilaterally are stable.  There
is no airspace disease, edema or significant pleural effusion.  The
osseous structures appear unchanged.  There are multiple surgical
clips at the gastroesophageal junction.
IMPRESSION: Stable cardiomegaly and chronic lung disease.  No acute
cardiopulmonary process.

## 2014-08-20 ENCOUNTER — Other Ambulatory Visit: Payer: Self-pay | Admitting: *Deleted

## 2014-08-20 NOTE — Patient Outreach (Signed)
Call placed to member to complete transition of care program and close case.  No answer, will call back.  Valente David, BSN, [place] Management  [facility] Care Manager [phone]

## 2014-08-22 ENCOUNTER — Encounter: Payer: Self-pay | Admitting: *Deleted

## 2014-08-22 ENCOUNTER — Other Ambulatory Visit: Payer: Self-pay | Admitting: *Deleted

## 2014-08-22 NOTE — Patient Outreach (Signed)
Call placed to member to complete transition of care program.  Member again states that she does not want any home visits, nor does she desire to continue with [facility].  Member encouraged to continue to have regular visits with primary care provider and cardiologist and to continue to take medications as prescribed.  Also encouraged to weigh self on a regular basis to monitor fluid levels.  Member made aware that if anything changes with her current situation that she is able to contact the main office for further assessment of needs.  Will send physician and member a case closure letter. Will request care manager assistant to close case.  Valente David, BSN, [place] Management  [facility] Care Manager [phone]

## 2014-08-28 NOTE — Patient Outreach (Signed)
[facility]) Care Management  08/28/2014  [name] [birth year] [id number]   Notification from Valente David, RN patient refused to participate with [place] Management services.  Ronnell [place]. [place], [employer] Management [place] Assistant Phone: [phone] Fax: [phone]

## 2014-08-29 ENCOUNTER — Encounter: Payer: Commercial Managed Care - HMO | Admitting: Internal Medicine

## 2014-09-01 ENCOUNTER — Encounter (HOSPITAL_COMMUNITY): Payer: Self-pay | Admitting: Emergency Medicine

## 2014-09-01 ENCOUNTER — Emergency Department (HOSPITAL_COMMUNITY): Payer: Commercial Managed Care - HMO

## 2014-09-01 ENCOUNTER — Inpatient Hospital Stay (HOSPITAL_COMMUNITY)
Admission: EM | Admit: 2014-09-01 | Discharge: 2014-09-04 | DRG: 291 | Disposition: A | Discharge status: Home / Self Care | Payer: Commercial Managed Care - HMO | Attending: Internal Medicine | Admitting: Internal Medicine

## 2014-09-01 DIAGNOSIS — G4730 Sleep apnea, unspecified: Secondary | ICD-10-CM | POA: Diagnosis present

## 2014-09-01 DIAGNOSIS — I509 Heart failure, unspecified: Secondary | ICD-10-CM | POA: Diagnosis not present

## 2014-09-01 DIAGNOSIS — R739 Hyperglycemia, unspecified: Secondary | ICD-10-CM | POA: Diagnosis present

## 2014-09-01 DIAGNOSIS — I272 Other secondary pulmonary hypertension: Secondary | ICD-10-CM | POA: Diagnosis not present

## 2014-09-01 DIAGNOSIS — Z955 Presence of coronary angioplasty implant and graft: Secondary | ICD-10-CM

## 2014-09-01 DIAGNOSIS — Z720 Tobacco use: Secondary | ICD-10-CM | POA: Diagnosis present

## 2014-09-01 DIAGNOSIS — M79604 Pain in right leg: Secondary | ICD-10-CM | POA: Diagnosis present

## 2014-09-01 DIAGNOSIS — I5032 Chronic diastolic (congestive) heart failure: Secondary | ICD-10-CM

## 2014-09-01 DIAGNOSIS — I5030 Unspecified diastolic (congestive) heart failure: Secondary | ICD-10-CM | POA: Diagnosis present

## 2014-09-01 DIAGNOSIS — E785 Hyperlipidemia, unspecified: Secondary | ICD-10-CM | POA: Diagnosis present

## 2014-09-01 DIAGNOSIS — J9622 Acute and chronic respiratory failure with hypercapnia: Secondary | ICD-10-CM | POA: Diagnosis present

## 2014-09-01 DIAGNOSIS — R0602 Shortness of breath: Secondary | ICD-10-CM | POA: Diagnosis not present

## 2014-09-01 DIAGNOSIS — K219 Gastro-esophageal reflux disease without esophagitis: Secondary | ICD-10-CM | POA: Diagnosis present

## 2014-09-01 DIAGNOSIS — Z90710 Acquired absence of both cervix and uterus: Secondary | ICD-10-CM | POA: Diagnosis not present

## 2014-09-01 DIAGNOSIS — G4733 Obstructive sleep apnea (adult) (pediatric): Secondary | ICD-10-CM | POA: Diagnosis present

## 2014-09-01 DIAGNOSIS — R911 Solitary pulmonary nodule: Secondary | ICD-10-CM | POA: Diagnosis present

## 2014-09-01 DIAGNOSIS — I2510 Atherosclerotic heart disease of native coronary artery without angina pectoris: Secondary | ICD-10-CM | POA: Diagnosis present

## 2014-09-01 DIAGNOSIS — R079 Chest pain, unspecified: Secondary | ICD-10-CM | POA: Diagnosis present

## 2014-09-01 DIAGNOSIS — F17210 Nicotine dependence, cigarettes, uncomplicated: Secondary | ICD-10-CM | POA: Diagnosis not present

## 2014-09-01 DIAGNOSIS — E669 Obesity, unspecified: Secondary | ICD-10-CM | POA: Diagnosis present

## 2014-09-01 DIAGNOSIS — J9620 Acute and chronic respiratory failure, unspecified whether with hypoxia or hypercapnia: Secondary | ICD-10-CM | POA: Diagnosis present

## 2014-09-01 DIAGNOSIS — Z7982 Long term (current) use of aspirin: Secondary | ICD-10-CM | POA: Diagnosis not present

## 2014-09-01 DIAGNOSIS — M79609 Pain in unspecified limb: Secondary | ICD-10-CM | POA: Diagnosis not present

## 2014-09-01 DIAGNOSIS — J449 Chronic obstructive pulmonary disease, unspecified: Secondary | ICD-10-CM | POA: Diagnosis not present

## 2014-09-01 DIAGNOSIS — Z6841 Body Mass Index (BMI) 40.0 and over, adult: Secondary | ICD-10-CM | POA: Diagnosis not present

## 2014-09-01 DIAGNOSIS — M7989 Other specified soft tissue disorders: Secondary | ICD-10-CM | POA: Diagnosis not present

## 2014-09-01 DIAGNOSIS — J9621 Acute and chronic respiratory failure with hypoxia: Secondary | ICD-10-CM | POA: Diagnosis not present

## 2014-09-01 DIAGNOSIS — I5043 Acute on chronic combined systolic (congestive) and diastolic (congestive) heart failure: Principal | ICD-10-CM | POA: Diagnosis present

## 2014-09-01 DIAGNOSIS — I288 Other diseases of pulmonary vessels: Secondary | ICD-10-CM | POA: Diagnosis not present

## 2014-09-01 DIAGNOSIS — F17200 Nicotine dependence, unspecified, uncomplicated: Secondary | ICD-10-CM

## 2014-09-01 LAB — CBC WITH DIFFERENTIAL/PLATELET
BASOS PCT: 0 % (ref 0–1)
Basophils Absolute: 0 10*3/uL (ref 0.0–0.1)
Eosinophils Absolute: 0.1 10*3/uL (ref 0.0–0.7)
Eosinophils Relative: 1 % (ref 0–5)
HCT: 42.3 % (ref 36.0–46.0)
HEMOGLOBIN: 12.9 g/dL (ref 12.0–15.0)
LYMPHS ABS: 1.4 10*3/uL (ref 0.7–4.0)
Lymphocytes Relative: 20 % (ref 12–46)
MCH: 31.4 pg (ref 26.0–34.0)
MCHC: 30.5 g/dL (ref 30.0–36.0)
MCV: 102.9 fL — ABNORMAL HIGH (ref 78.0–100.0)
MONOS PCT: 9 % (ref 3–12)
Monocytes Absolute: 0.7 10*3/uL (ref 0.1–1.0)
NEUTROS ABS: 4.9 10*3/uL (ref 1.7–7.7)
NEUTROS PCT: 70 % (ref 43–77)
PLATELETS: 146 10*3/uL — AB (ref 150–400)
RBC: 4.11 MIL/uL (ref 3.87–5.11)
RDW: 14.8 % (ref 11.5–15.5)
WBC: 7.1 10*3/uL (ref 4.0–10.5)

## 2014-09-01 LAB — BASIC METABOLIC PANEL
ANION GAP: 4 — AB (ref 5–15)
CO2: 42 mmol/L — AB (ref 22–32)
Calcium: 8.7 mg/dL — ABNORMAL LOW (ref 8.9–10.3)
Chloride: 92 mmol/L — ABNORMAL LOW (ref 101–111)
Creatinine, Ser: 0.8 mg/dL (ref 0.44–1.00)
GLUCOSE: 123 mg/dL — AB (ref 65–99)
POTASSIUM: 3.5 mmol/L (ref 3.5–5.1)
Sodium: 138 mmol/L (ref 135–145)

## 2014-09-01 LAB — TROPONIN I: Troponin I: 0.03 ng/mL (ref <0.031)

## 2014-09-01 LAB — I-STAT CG4 LACTIC ACID, ED: LACTIC ACID, VENOUS: 1.55 mmol/L (ref 0.5–2.0)

## 2014-09-01 LAB — BRAIN NATRIURETIC PEPTIDE: B NATRIURETIC PEPTIDE 5: 910.3 pg/mL — AB (ref 0.0–100.0)

## 2014-09-01 MED ORDER — ALBUTEROL (5 MG/ML) CONTINUOUS INHALATION SOLN
10.0000 mg/h | INHALATION_SOLUTION | Freq: Once | RESPIRATORY_TRACT | Status: AC
  Administered 2014-09-01: 10 mg/h via RESPIRATORY_TRACT
  Filled 2014-09-01: qty 20

## 2014-09-01 MED ORDER — FUROSEMIDE 10 MG/ML IJ SOLN
80.0000 mg | Freq: Once | INTRAMUSCULAR | Status: AC
  Administered 2014-09-01: 80 mg via INTRAVENOUS
  Filled 2014-09-01: qty 8

## 2014-09-01 MED ORDER — IPRATROPIUM BROMIDE 0.02 % IN SOLN
0.5000 mg | Freq: Once | RESPIRATORY_TRACT | Status: AC
  Administered 2014-09-01: 0.5 mg via RESPIRATORY_TRACT
  Filled 2014-09-01: qty 2.5

## 2014-09-01 NOTE — ED Notes (Signed)
The patient said she has been for two and strarted having lung pain on the right side. The patient denies any coughing, no diaphoresis but she is lightheaded, dizzy, and feels weak.  The patient is pale and has dyspnea at rest.  EMS placed an IV in the left [facility] gave her 158m of Solumedrol, and a duoneb.  Initially the patient's oxygen level was in the 80's.  They placed her on a non-rebreather and she came back up to 83. After a duoneb the level came up to 93.  The patient did say she has been out of lasix and has not taken it for about four days.  She does have some peripheral edema that normal for her.  She rates her pain 7/10.

## 2014-09-01 NOTE — H&P (Signed)
Date: 09/02/2014               Patient Name:  [name] MRN: [id number]  DOB: [birth year] Age / Sex: 59 y.o., female   PCP: Oval Linsey, MD         Medical Service: Internal Medicine Teaching Service         Attending Physician: Dr. Sid Falcon, MD    First Contact: Dr. Charlott Rakes Pager: [phone]  Second Contact: Dr. Juluis Mire Pager: [phone]       After Hours (After 5p/  First Contact Pager: [phone]  weekends / holidays): Second Contact Pager: [phone]   Chief Complaint: SOB, weakness  History of Present Illness: [name] is a 59 year old woman with combined CHF, COPD, CAD s/p DES, HL who presents with SOB and weakness. She says that she has had progressive SOB for the past week to week and a half. She has also had generalized weakness and says for the past four days she has been very somnolent. She has not taken her medicines other than inhalers for the past two days, including missing lasix. She says for the past week she has used her albuterol nebulizer about once a day. She also notes R sided aching chest pain that does through to the right side of her back. She says this is more of a chronic pain but that it has occurred more frequently the past week. She is unsure if anything such as exertion, breathing, eating, motions make it better or worse. She also notes that her R leg is achy but does not think she has increased swelling. She does not think her two pillow orthopnea has changed. She also says she has had a subjective chill. A few days ago she had four loose bowel movements but this has resolved. She denies any cough, abdominal pain, nausea, emesis, constipation, dysuria, focal weakness, numbness.   EMS gave her solumedrol 125 mg iv once and a duoneb treatment when her initial oxygen level was in the 80s and placed her on nonrebreather and came up to 93%. She says she has been out of lasix for about four days.  In the ED,  she received lasix 80 mg iv once, duoneb x 1.  She was sating in the 80s on 6L nasal cannula during our interview and we placed her on venturimask.  Meds: No current facility-administered medications for this encounter.   Current Outpatient Prescriptions  Medication Sig Dispense Refill  . albuterol (PROVENTIL HFA;VENTOLIN HFA) 108 (90 BASE) MCG/ACT inhaler Inhale 2 puffs into the lungs every 6 (six) hours as needed for wheezing or shortness of breath. 1 Inhaler 2  . albuterol (PROVENTIL) (2.5 MG/3ML) 0.083% nebulizer solution Take 3 mLs (2.5 mg total) by nebulization every 6 (six) hours as needed for wheezing or shortness of breath. 75 mL 1  . aspirin 81 MG chewable tablet Chew 1 tablet (81 mg total) by mouth daily. (Patient taking differently: Chew 81 mg by mouth at bedtime. ) 30 tablet 11  . Aspirin-Salicylamide-Caffeine (BC HEADACHE PO) Take 1 Package by mouth daily as needed (pain).    . Fluticasone-Salmeterol (ADVAIR DISKUS) 250-50 MCG/DOSE AEPB Inhale 1 puff into the lungs 2 (two) times daily. 60 each 5  . furosemide (LASIX) 40 MG tablet Take 1 tablet (40 mg total) by mouth daily. Please note higher strength tablet.  Only take 1 tablet daily. 30 tablet 2  . lovastatin (MEVACOR) 20 MG tablet Take 1 tablet (20 mg  total) by mouth at bedtime. (Patient taking differently: Take 40 mg by mouth at bedtime. ) 90 tablet 3  . pantoprazole (PROTONIX) 40 MG tablet Take 1 tablet (40 mg total) by mouth daily. 30 tablet 0  . tiotropium (SPIRIVA HANDIHALER) 18 MCG inhalation capsule Place 1 capsule (18 mcg total) into inhaler and inhale daily. (Patient taking differently: Place 18 mcg into inhaler and inhale 2 (two) times daily. ) 30 capsule 12  . naproxen (NAPROSYN) 500 MG tablet Take 1 tablet by mouth as needed.  0    Allergies: Allergies as of 09/01/2014 - Review Complete 09/01/2014  Allergen Reaction Noted  . Fluconazole Anaphylaxis, Itching, and Swelling    Past Medical History  Diagnosis Date  . Coronary artery disease     S/P PCI of LAD  with DES (12/2008). Total occlusion of RCA noted at that time., medically managed. ACS ruled out 03/2009 with Lexiscan myoview . Followed by [facility].  . Pulmonary hypertension     2-D Echo (40/9811) - Systolic pressure was moderately increased. PA peak pressure  57mHg. 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 Followed by Dr. WJoya Gaskins(Velora Heckler  . Diastolic dysfunction     2-D Echo (12/2008) - Normal LV Systolic funciton with EF 60-65%. Grade 1 diastolid dysfunction. No regional wall motion abnormalities. Moderate pulmonary HTN with PA peak pressure 563mg.  . Marland KitchenOPD (chronic obstructive pulmonary disease)     Severe. Gold Stage IV.  PFTs (12/2008) - severe obstructive airway disease. Active tobacco use. Requires 4L O2 at home.  . Pulmonary nodule, right     Small right middle lobe nodule. Stable as of 12/2008.  . Marland Kitchenrediabetes 12/2008    HgbA1c 6.4 (12/2008)  . Hx MRSA infection     Recurrent MRSA thigh abscesses.  . Tobacco abuse     Ongoing.  . Obesity   . Hyperlipidemia   . GERD (gastroesophageal reflux disease)     S/P Nissen fundoplication.  . CHF (congestive heart failure)   . Shortness of breath dyspnea    Past Surgical History  Procedure Laterality Date  . Total abdominal hysterectomy w/ bilateral salpingoophorectomy    . Nissen fundoplication    . Right heart catheterization N/A 11/09/2012    Procedure: RIGHT HEART CATH;  Surgeon: DaLarey DresserMD;  [facility];  Service: Cardiovascular;  Laterality: N/A;   Family History  Problem Relation Age of Onset  . Heart disease Mother 6067  Deceased from MI at 60yo. Hypertension Mother   . Heart disease Father 5460  Deceased of MI age 30109yo. Hypertension Father   . Hypertension Brother   . Lung cancer      Grandmother   History   Social History  . Marital Status: Single    Spouse Name: N/A  . Number of  Children: N/A  . Years of Education: N/A   Occupational History  . Not on file.   Social History Main Topics  . Smoking status: Current Every Day Smoker -- 0.50 packs/day for 40 years    Types: Cigarettes  . Smokeless tobacco: Never Used     Comment: 1/2 pack or less.   . Alcohol Use: No  . Drug Use: No  . Sexual Activity: No   Other Topics Concern  . Not on file   Social History Narrative   Formerly worked as a  Scientist, water quality, now disabled.   Divorced.   2 grown children.   Lives with her grandson.    Review of Systems: Review of systems negative except as noted above per HPI  Physical Exam: Blood pressure 126/73, pulse 99, resp. rate 27, SpO2 88 %.  Gen: Chronically ill appearing, uncomfortable, jittery, A&O x 3 HEENT: Atraumatic, PERRL, EOMI, sclerae anicteric, moist mucous membranes Heart: Regular rate and rhythm, normal S1 S2, no murmurs, rubs, or gallops Lungs: Soft bibasilar crackles, respirations somewhat labored Abd: Soft, non-tender, non-distended, + bowel sounds, no hepatosplenomegaly, well healed vertical midline scar Ext: b/l 2+ pitting edema to knees. R leg more swollen than L, with warmth, erythema, tenderness  Lab results: Basic Metabolic Panel:  Recent Labs  09/01/14 2209  NA 138  K 3.5  CL 92*  CO2 42*  GLUCOSE 123*  BUN <5*  CREATININE 0.80  CALCIUM 8.7*   CBC:  Recent Labs  09/01/14 2209  WBC 7.1  NEUTROABS 4.9  HGB 12.9  HCT 42.3  MCV 102.9*  PLT 146*   Cardiac Enzymes:  Recent Labs  09/01/14 2200  TROPONINI 0.03     Misc. Labs: BNP 910 i-stat lactic acid 1.55 pH 7.32, pCO2 82, pO2 55, bicarb 41  Imaging results:  Dg Chest Portable 1 View  09/01/2014   CLINICAL DATA:  Acute onset of shortness of breath and right-sided chest pain. Initial encounter.  EXAM: PORTABLE CHEST - 1 VIEW  COMPARISON:  Chest radiograph performed 07/15/2014  FINDINGS: The lungs are well-aerated. Vascular congestion is noted. A small right pleural  effusion is noted. Increased interstitial markings raise concern for pulmonary edema. No pneumothorax is seen.  The cardiomediastinal silhouette is enlarged. No acute osseous abnormalities are seen.  IMPRESSION: Vascular congestion and cardiomegaly. Small right pleural effusion noted. Increased interstitial markings raise concern for pulmonary edema.   Electronically Signed   By: Garald Balding M.D.   On: 09/01/2014 22:20    Other results: EKG: NSR, t-wave inversion in II, III, aVF, V1-V6, inversion in II, III, aVF, V4-V6 new from prior 07/30/14, new ST depression in V3-V5 from prior   Assessment & Plan by Problem: Active Problems:   Obesity   TOBACCO ABUSE   Coronary atherosclerosis   Severe chronic obstructive pulmonary disease   GASTROESOPHAGEAL REFLUX DISEASE   Sleep apnea   Hyperlipidemia   Chronic diastolic CHF (congestive heart failure)   Acute on chronic respiratory failure with hypercapnia   Chest pain  #Acute on Chronic Respiratory Failure with Hypercapnia: The cause of [name]'s dyspnea and weakness is unclear. She has known combined CHF and notes she has not taken her home lasix in two days due to fatigue. She also had bibasilar crackles and LE edema. Her BNP is elevated at 910 with CXR demonstrating pulmonary edema. However, her recent TTE 05/2014 was notable for EF 22-63%, grade 1 diastolic dysfunction and she is only slightly more hypercapnic from her baseline as a chronic retainer. There is also concern for PE given her impressive hypoxia, R sided chest pain, and swollen RLE with warmth, tenderness, erythema. She will get chest CTA in ED and consider RLE Doppler if CTA negative. Also consider COPD exacerbation but she denies cough, and was not wheezy although she has already received solumedrol and breathing treatments. She already received lasix 80 mg iv in the ED with 830 cc urine charted so far. -chest CTA, consider RLE Doppler if negative -troponin q6h x 3 -repeat EKG in  am -duoneb q4hprn -dulera  2 puff inh bid -spiriva 18 mcg in daily -O2 sat >88%, venturimask for now but BiPAP if needed -telemetry -daily weight, I&O -reassess volume status and further diuresis in morning per day team  #Chest pain: [name] complains of R sided intermittent chest pain that radiates to her R back. She is unsure of chronicity but makes it sound chronic. She is unsure of exacerbating factors. There is concern for PE given her impressive hypoxia, R sided chest pain, and swollen RLE with warmth, tenderness, erythema. She will get chest CTA in ED and consider RLE Doppler if CTA negative. She also has history of CAD and while her initial troponin was negative, her EKG was concerning for new lateral ST depression. -chest CTA, consider RLE Doppler if negative -troponin q6h x 3 -repeat EKG in am -telemetry -tylenol 650 mg po or pr q6hprn -ASA 81 mg daily, pravastatin 40 mg qhs  #Combined CHF: See above. Her last TTE 05/2014 was notable for EF 50-03%, grade 1 diastolic dysfunction. At home she is on lasix 40 gm daily, ASA 81 mg daily, lovastatin 20 mg qhs. She already received lasix 80 mg iv in the ED with 830 cc urine charted so far. -O2 sat >88%, ventimask for now but BiPAP if needed -telemetry -daily weight, I&O -reassess volume status and further diuresis in morning per day team -ASA 81 mg daily, pravastatin 40 mg qhs  #COPD: See above. COPD exacerbation also possible but less likely given clinical picture. She was not wheezy on exam although she received solumedrol 125 mg iv per EMS and two duoneb treatments prior to our assessment. PFT in 12/2008 with FEV1/FVC 60%, FEV1 37%, FVC 45%. She requires 4L O2 at home.  She also uses Advair 1 puff bid, Spiriva 18 mcg inh daily, albuterol 2 puff q6hprn.  -duoneb q4hprn -dulera 2 puff inh bid -spiriva 18 mcg in daily -O2 sat >88%, ventimask for now but BiPAP if needed -telemetry  CAD: [name] has CAD s/p PCI of LAD w DES, RCA 100%  occluded and medically managed in 2010. Initial troponin negative but EKG concerning for new lateral ST depression. Last stress test 06/2014 with no reversible ischemia or infarction, normal LV wall motion, LVEF 63%, overall low-risk stress test.  Last seen by cardiology 07/30/14 with no change in management and told to follow-up with Dr Percival Spanish in 3 months. At home she is on ASA 81 mg daily and lovastatin 20 mg qhs. -troponin q6h x 3 -telemetry -ASA 81 mg daily, pravastatin 40 mg qhs  #HL: Last lipid profile 11/2012 with cholesterol 237, LDL 136, HDL 30, triglyceride 356. At home she is on lovastatin 20 mg qhs. -pravastatin 40 mg qhs  #Diet: NPO  #DVT PPx: lovenox 40 mg [place] daily  #Code: Full  Dispo: Disposition is deferred at this time, awaiting improvement of current medical problems. Anticipated discharge in approximately 1-2 day(s).   The patient does have a current PCP Oval Linsey, MD) and does need an [facility] hospital follow-up appointment after discharge.  The patient does not know have transportation limitations that hinder transportation to clinic appointments.  Signed: Lottie Mussel, MD Internal Medicine, PGY-1 Pager [phone] 09/02/2014, 1:20 AM

## 2014-09-01 NOTE — ED Provider Notes (Signed)
CSN: [id number]     Arrival date & time 09/01/14  2128 History   First MD Initiated Contact with Patient 09/01/14 2131     Chief Complaint  Patient presents with  . Chest Pain    The patient said she has been for two and strarted having lung pain on the right side. The patient denies any coughing, no diaphoresis but she is lightheaded, dizzy, and feels weak.     (Consider location/radiation/quality/duration/timing/severity/associated sxs/prior Treatment) Patient is a 59 y.o. female presenting with chest pain and shortness of breath. The history is provided by the patient.  Chest Pain Associated symptoms: cough (tonight), fatigue (past 3 days) and shortness of breath   Associated symptoms: no abdominal pain, no fever and not vomiting   Shortness of Breath Severity:  Moderate Onset quality:  Gradual Timing:  Constant Progression:  Worsening Chronicity:  New Context: not URI   Context comment:  Missed lasix for 4 days Relieved by:  Nothing Worsened by:  Nothing tried Associated symptoms: chest pain (R side) and cough (tonight)   Associated symptoms: no abdominal pain, no fever, no rash and no vomiting     Past Medical History  Diagnosis Date  . Coronary artery disease     S/P PCI of LAD with DES (12/2008). Total occlusion of RCA noted at that time., medically managed. ACS ruled out 03/2009 with Lexiscan myoview . Followed by [place].  . Pulmonary hypertension     2-D Echo (46/5681) - Systolic pressure was moderately increased. PA peak pressure  19mHg. 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 Followed by Dr. WJoya Gaskins(Velora Heckler  . Diastolic dysfunction     2-D Echo (12/2008) - Normal LV Systolic funciton with EF 60-65%. Grade 1 diastolid dysfunction. No regional wall motion abnormalities. Moderate pulmonary HTN with PA peak pressure 528mg.  . Marland KitchenOPD (chronic obstructive pulmonary  disease)     Severe. Gold Stage IV.  PFTs (12/2008) - severe obstructive airway disease. Active tobacco use. Requires 4L O2 at home.  . Pulmonary nodule, right     Small right middle lobe nodule. Stable as of 12/2008.  . Marland Kitchenrediabetes 12/2008    HgbA1c 6.4 (12/2008)  . Hx MRSA infection     Recurrent MRSA thigh abscesses.  . Tobacco abuse     Ongoing.  . Obesity   . Hyperlipidemia   . GERD (gastroesophageal reflux disease)     S/P Nissen fundoplication.  . CHF (congestive heart failure)   . Shortness of breath dyspnea    Past Surgical History  Procedure Laterality Date  . Total abdominal hysterectomy w/ bilateral salpingoophorectomy    . Nissen fundoplication    . Right heart catheterization N/A 11/09/2012    Procedure: RIGHT HEART CATH;  Surgeon: DaLarey DresserMD;  [facility];  Service: Cardiovascular;  Laterality: N/A;   Family History  Problem Relation Age of Onset  . Heart disease Mother 4794  Deceased from MI at 60yo. Hypertension Mother   . Heart disease Father 5458  Deceased of MI age 60yo. Hypertension Father   . Hypertension Brother   . Lung cancer      Grandmother   History  Substance Use Topics  . Smoking status: Current Every Day Smoker -- 0.50 packs/day for 40 years    Types: Cigarettes  . Smokeless tobacco: Never Used  Comment: 1/2 pack or less.   . Alcohol Use: No   OB History    No data available     Review of Systems  Constitutional: Positive for fatigue (past 3 days). Negative for fever and chills.  HENT: Positive for rhinorrhea.   Respiratory: Positive for cough (tonight) and shortness of breath.   Cardiovascular: Positive for chest pain (R side).  Gastrointestinal: Negative for vomiting and abdominal pain.  Skin: Negative for rash.  All other systems reviewed and are negative.     Allergies  Fluconazole  Home Medications   Prior to Admission medications   Medication Sig Start Date End Date Taking? Authorizing  Provider  albuterol (PROVENTIL HFA;VENTOLIN HFA) 108 (90 BASE) MCG/ACT inhaler Inhale 2 puffs into the lungs every 6 (six) hours as needed for wheezing or shortness of breath. 05/27/14   Norman Herrlich, MD  albuterol (PROVENTIL) (2.5 MG/3ML) 0.083% nebulizer solution Take 3 mLs (2.5 mg total) by nebulization every 6 (six) hours as needed for wheezing or shortness of breath. 07/16/14   Kelby Aline, MD  aspirin 81 MG chewable tablet Chew 1 tablet (81 mg total) by mouth daily. Patient taking differently: Chew 81 mg by mouth at bedtime.  05/27/14   Norman Herrlich, MD  Fluticasone-Salmeterol (ADVAIR DISKUS) 250-50 MCG/DOSE AEPB Inhale 1 puff into the lungs 2 (two) times daily. 07/07/14   Oval Linsey, MD  furosemide (LASIX) 40 MG tablet Take 1 tablet (40 mg total) by mouth daily. Please note higher strength tablet.  Only take 1 tablet daily. 05/29/14   Oval Linsey, MD  lovastatin (MEVACOR) 20 MG tablet Take 1 tablet (20 mg total) by mouth at bedtime. 05/29/14   Oval Linsey, MD  naproxen (NAPROSYN) 500 MG tablet Take 1 tablet by mouth as needed. 07/01/14   Historical Provider, MD  pantoprazole (PROTONIX) 40 MG tablet Take 1 tablet (40 mg total) by mouth daily. 08/03/14   Oval Linsey, MD  Polyvinyl Alcohol-Povidone (TEARS PLUS OP) Place 1 drop into both eyes daily as needed (dry eyes).    Historical Provider, MD  tiotropium (SPIRIVA HANDIHALER) 18 MCG inhalation capsule Place 1 capsule (18 mcg total) into inhaler and inhale daily. 05/29/14   Oval Linsey, MD   SpO2 93% Physical Exam  Constitutional: She is oriented to person, place, and time. She appears well-developed and well-nourished. No distress.  HENT:  Head: Normocephalic and atraumatic.  Mouth/Throat: Oropharynx is clear and moist.  Eyes: EOM are normal. Pupils are equal, round, and reactive to light.  Neck: Normal range of motion. Neck supple.  Cardiovascular: Normal rate and regular rhythm.  Exam reveals no friction rub.   No murmur  heard. Pulmonary/Chest: Effort normal and breath sounds normal. No respiratory distress. She has no wheezes. She has no rales.  Abdominal: Soft. She exhibits no distension. There is no tenderness. There is no rebound.  Musculoskeletal: Normal range of motion. She exhibits no edema.  Neurological: She is alert and oriented to person, place, and time. No cranial nerve deficit. She exhibits normal muscle tone.  Skin: Skin is warm. No rash noted. She is not diaphoretic.  Nursing note and vitals reviewed.   ED Course  Procedures (including critical care time) Labs Review Labs Reviewed  CULTURE, BLOOD (ROUTINE X 2)  CULTURE, BLOOD (ROUTINE X 2)  CBC WITH DIFFERENTIAL/PLATELET  BASIC METABOLIC PANEL  BRAIN NATRIURETIC PEPTIDE  I-STAT CG4 LACTIC ACID, ED    Imaging Review Dg Chest Portable 1 View  09/01/2014  CLINICAL DATA:  Acute onset of shortness of breath and right-sided chest pain. Initial encounter.  EXAM: PORTABLE CHEST - 1 VIEW  COMPARISON:  Chest radiograph performed 07/15/2014  FINDINGS: The lungs are well-aerated. Vascular congestion is noted. A small right pleural effusion is noted. Increased interstitial markings raise concern for pulmonary edema. No pneumothorax is seen.  The cardiomediastinal silhouette is enlarged. No acute osseous abnormalities are seen.  IMPRESSION: Vascular congestion and cardiomegaly. Small right pleural effusion noted. Increased interstitial markings raise concern for pulmonary edema.   Electronically Signed   By: Garald Balding M.D.   On: 09/01/2014 22:20     EKG Interpretation   Date/Time:  Monday Sep 01 2014 21:38:59 EDT Ventricular Rate:  86 PR Interval:  141 QRS Duration: 100 QT Interval:  358 QTC Calculation: 428 R Axis:   102 Text Interpretation:  Sinus rhythm Right axis deviation Abnormal R-wave  progression, early transition Repol abnrm suggests ischemia, diffuse leads  T wave inversions in V3-V6, new Confirmed by [facility]  MD, [place] [employer] ([id number])  on  09/01/2014 10:31:53 PM      MDM   Final diagnoses:  CHF exacerbation    55F with hx of COPD presents with fatigue for 3 days. Also having R sided chest pain. Patient here with shortness of breath also - she hasn't taken her lasix for 4 days. Here SOB, mild diffuse wheezing. CXR shows concern for pulmonary edema. Given 80 mg of lasix. Concern for COPD + heart failure. Mild edema in bilateral lower extremities. I turned her O2 up to 5L which impoved her sats from the mid 80s to the high 80s. BNP elevated. Admitted.     Evelina Bucy, MD 09/02/14 [phone]

## 2014-09-02 ENCOUNTER — Inpatient Hospital Stay (HOSPITAL_COMMUNITY): Payer: Commercial Managed Care - HMO

## 2014-09-02 ENCOUNTER — Other Ambulatory Visit: Payer: Self-pay | Admitting: *Deleted

## 2014-09-02 ENCOUNTER — Encounter (HOSPITAL_COMMUNITY): Payer: Self-pay

## 2014-09-02 ENCOUNTER — Other Ambulatory Visit: Payer: Self-pay

## 2014-09-02 DIAGNOSIS — Z7982 Long term (current) use of aspirin: Secondary | ICD-10-CM

## 2014-09-02 DIAGNOSIS — Z9981 Dependence on supplemental oxygen: Secondary | ICD-10-CM

## 2014-09-02 DIAGNOSIS — F17210 Nicotine dependence, cigarettes, uncomplicated: Secondary | ICD-10-CM

## 2014-09-02 DIAGNOSIS — E785 Hyperlipidemia, unspecified: Secondary | ICD-10-CM

## 2014-09-02 DIAGNOSIS — M79604 Pain in right leg: Secondary | ICD-10-CM | POA: Diagnosis present

## 2014-09-02 DIAGNOSIS — J449 Chronic obstructive pulmonary disease, unspecified: Secondary | ICD-10-CM

## 2014-09-02 DIAGNOSIS — R079 Chest pain, unspecified: Secondary | ICD-10-CM

## 2014-09-02 DIAGNOSIS — M7989 Other specified soft tissue disorders: Secondary | ICD-10-CM

## 2014-09-02 DIAGNOSIS — J9622 Acute and chronic respiratory failure with hypercapnia: Secondary | ICD-10-CM

## 2014-09-02 DIAGNOSIS — R911 Solitary pulmonary nodule: Secondary | ICD-10-CM

## 2014-09-02 DIAGNOSIS — Z79899 Other long term (current) drug therapy: Secondary | ICD-10-CM

## 2014-09-02 DIAGNOSIS — Z7951 Long term (current) use of inhaled steroids: Secondary | ICD-10-CM

## 2014-09-02 DIAGNOSIS — J9620 Acute and chronic respiratory failure, unspecified whether with hypoxia or hypercapnia: Secondary | ICD-10-CM | POA: Diagnosis present

## 2014-09-02 DIAGNOSIS — I5040 Unspecified combined systolic (congestive) and diastolic (congestive) heart failure: Secondary | ICD-10-CM

## 2014-09-02 DIAGNOSIS — I2510 Atherosclerotic heart disease of native coronary artery without angina pectoris: Secondary | ICD-10-CM

## 2014-09-02 DIAGNOSIS — J9621 Acute and chronic respiratory failure with hypoxia: Secondary | ICD-10-CM

## 2014-09-02 DIAGNOSIS — M79609 Pain in unspecified limb: Secondary | ICD-10-CM

## 2014-09-02 LAB — BASIC METABOLIC PANEL
ANION GAP: 12 (ref 5–15)
Anion gap: 8 (ref 5–15)
CHLORIDE: 91 mmol/L — AB (ref 101–111)
CHLORIDE: 87 mmol/L — AB (ref 101–111)
CO2: 39 mmol/L — ABNORMAL HIGH (ref 22–32)
CO2: 43 mmol/L — ABNORMAL HIGH (ref 22–32)
Calcium: 8.6 mg/dL — ABNORMAL LOW (ref 8.9–10.3)
Calcium: 9 mg/dL (ref 8.9–10.3)
Creatinine, Ser: 0.94 mg/dL (ref 0.44–1.00)
Creatinine, Ser: 0.87 mg/dL (ref 0.44–1.00)
GLUCOSE: 263 mg/dL — AB (ref 65–99)
Glucose, Bld: 175 mg/dL — ABNORMAL HIGH (ref 65–99)
POTASSIUM: 4 mmol/L (ref 3.5–5.1)
Potassium: 3.3 mmol/L — ABNORMAL LOW (ref 3.5–5.1)
Sodium: 138 mmol/L (ref 135–145)
Sodium: 142 mmol/L (ref 135–145)

## 2014-09-02 LAB — GLUCOSE, CAPILLARY
GLUCOSE-CAPILLARY: 153 mg/dL — AB (ref 65–99)
Glucose-Capillary: 234 mg/dL — ABNORMAL HIGH (ref 65–99)
Glucose-Capillary: 187 mg/dL — ABNORMAL HIGH (ref 65–99)
Glucose-Capillary: 147 mg/dL — ABNORMAL HIGH (ref 65–99)

## 2014-09-02 LAB — BLOOD GAS, ARTERIAL
Acid-Base Excess: 14.2 mmol/L — ABNORMAL HIGH (ref 0.0–2.0)
BICARBONATE: 40.8 meq/L — AB (ref 20.0–24.0)
Drawn by: 42624
O2 Content: 6 L/min
O2 Saturation: 87.4 %
PATIENT TEMPERATURE: 98.6
PH ART: 7.32 — AB (ref 7.350–7.450)
TCO2: 43.3 mmol/L (ref 0–100)
pCO2 arterial: 81.5 mmHg (ref 35.0–45.0)
pO2, Arterial: 55.4 mmHg — ABNORMAL LOW (ref 80.0–100.0)

## 2014-09-02 LAB — TROPONIN I
Troponin I: <0.03 ng/mL (ref <0.031)
Troponin I: <0.03 ng/mL (ref <0.031)

## 2014-09-02 LAB — MRSA PCR SCREENING: MRSA by PCR: NEGATIVE

## 2014-09-02 MED ORDER — ASPIRIN 81 MG PO CHEW
81.0000 mg | CHEWABLE_TABLET | Freq: Every day | ORAL | Status: DC
  Administered 2014-09-02 – 2014-09-04 (×3): 81 mg via ORAL
  Filled 2014-09-02 (×2): qty 1

## 2014-09-02 MED ORDER — ACETAMINOPHEN 325 MG PO TABS: 650.0000 mg | ORAL_TABLET | Freq: Four times a day (QID) | ORAL | Status: DC | PRN

## 2014-09-02 MED ORDER — POTASSIUM CHLORIDE CRYS ER 20 MEQ PO TBCR
40.0000 meq | EXTENDED_RELEASE_TABLET | Freq: Once | ORAL | Status: AC
  Administered 2014-09-02: 40 meq via ORAL
  Filled 2014-09-02: qty 2

## 2014-09-02 MED ORDER — MOMETASONE FURO-FORMOTEROL FUM 100-5 MCG/ACT IN AERO
2.0000 | INHALATION_SPRAY | Freq: Two times a day (BID) | RESPIRATORY_TRACT | Status: DC
  Administered 2014-09-02 – 2014-09-04 (×4): 2 via RESPIRATORY_TRACT
  Filled 2014-09-02: qty 8.8

## 2014-09-02 MED ORDER — IPRATROPIUM-ALBUTEROL 0.5-2.5 (3) MG/3ML IN SOLN
3.0000 mL | RESPIRATORY_TRACT | Status: DC | PRN
  Administered 2014-09-02 – 2014-09-03 (×3): 3 mL via RESPIRATORY_TRACT
  Filled 2014-09-02 (×3): qty 3

## 2014-09-02 MED ORDER — PRAVASTATIN SODIUM 40 MG PO TABS
40.0000 mg | ORAL_TABLET | Freq: Every day | ORAL | Status: DC
  Administered 2014-09-02 – 2014-09-03 (×2): 40 mg via ORAL
  Filled 2014-09-02 (×3): qty 1

## 2014-09-02 MED ORDER — FUROSEMIDE 10 MG/ML IJ SOLN
40.0000 mg | Freq: Two times a day (BID) | INTRAMUSCULAR | Status: AC
  Administered 2014-09-02: 40 mg via INTRAVENOUS
  Filled 2014-09-02 (×2): qty 4

## 2014-09-02 MED ORDER — TIOTROPIUM BROMIDE MONOHYDRATE 18 MCG IN CAPS
18.0000 ug | ORAL_CAPSULE | Freq: Every day | RESPIRATORY_TRACT | Status: DC
  Administered 2014-09-03 – 2014-09-04 (×2): 18 ug via RESPIRATORY_TRACT
  Filled 2014-09-02: qty 5

## 2014-09-02 MED ORDER — INSULIN ASPART 100 UNIT/ML ~~LOC~~ SOLN: 0.0000 [IU] | Freq: Every day | SUBCUTANEOUS | Status: DC

## 2014-09-02 MED ORDER — ACETAMINOPHEN 650 MG RE SUPP: 650.0000 mg | Freq: Four times a day (QID) | RECTAL | Status: DC | PRN

## 2014-09-02 MED ORDER — ENOXAPARIN SODIUM 40 MG/0.4ML ~~LOC~~ SOLN: 40.0000 mg | SUBCUTANEOUS | Status: DC

## 2014-09-02 MED ORDER — INSULIN ASPART 100 UNIT/ML ~~LOC~~ SOLN
0.0000 [IU] | Freq: Three times a day (TID) | SUBCUTANEOUS | Status: DC
  Administered 2014-09-02: 1 [IU] via SUBCUTANEOUS
  Administered 2014-09-02: 2 [IU] via SUBCUTANEOUS

## 2014-09-02 MED ORDER — SODIUM CHLORIDE 0.9 % IJ SOLN
3.0000 mL | Freq: Two times a day (BID) | INTRAMUSCULAR | Status: DC
  Administered 2014-09-02: 10 mL via INTRAVENOUS
  Administered 2014-09-03 – 2014-09-04 (×2): 3 mL via INTRAVENOUS

## 2014-09-02 MED ORDER — IOHEXOL 350 MG/ML SOLN
80.0000 mL | Freq: Once | INTRAVENOUS | Status: AC | PRN
  Administered 2014-09-02: 80 mL via INTRAVENOUS

## 2014-09-02 MED ORDER — INSULIN ASPART 100 UNIT/ML ~~LOC~~ SOLN
0.0000 [IU] | Freq: Three times a day (TID) | SUBCUTANEOUS | Status: DC
  Administered 2014-09-03 (×2): 2 [IU] via SUBCUTANEOUS
  Administered 2014-09-04: 3 [IU] via SUBCUTANEOUS
  Administered 2014-09-04: 2 [IU] via SUBCUTANEOUS

## 2014-09-02 MED ORDER — PANTOPRAZOLE SODIUM 40 MG PO TBEC
40.0000 mg | DELAYED_RELEASE_TABLET | Freq: Every day | ORAL | Status: DC
  Administered 2014-09-02 – 2014-09-04 (×3): 40 mg via ORAL
  Filled 2014-09-02 (×3): qty 1

## 2014-09-02 MED ORDER — ALUM & MAG HYDROXIDE-SIMETH 200-200-20 MG/5ML PO SUSP
30.0000 mL | ORAL | Status: DC | PRN
  Administered 2014-09-02: 30 mL via ORAL
  Filled 2014-09-02: qty 30

## 2014-09-02 MED ORDER — ENOXAPARIN SODIUM 60 MG/0.6ML ~~LOC~~ SOLN
0.5000 mg/kg | SUBCUTANEOUS | Status: DC
  Administered 2014-09-02 – 2014-09-04 (×3): 60 mg via SUBCUTANEOUS
  Filled 2014-09-02 (×3): qty 0.6

## 2014-09-02 NOTE — Progress Notes (Addendum)
Subjective: This morning, she reported her breathing felt better and that her chest pain had resolved. She felt things have been declining since she had her fall 6-8 weeks ago. We explained to her the findings noted on her CTA on admission in the importance of following up with oncology should she pursue for additional workup. She would like to know all of her options, and we recommended that she follow-up with them as an outpatient to review them.  Per chart review, she was referred to [facility] though has not been cooperative with them, and per her case manager Valente David, she did not appear interested in wanting to continue working with them though they may be able to help her arrange for transportation to get to her appointments. Social work was initially contacted to help her procure resources so that she could afford her co-pays for her specialty clinic appointments though was unsuccessful in finding her resources for which she qualified.   Objective: Vital signs in last 24 hours: Filed Vitals:   09/02/14 1300 09/02/14 1400 09/02/14 1500 09/02/14 1600  BP: 128/72 118/74 117/69 128/90  Pulse: 77 86 87 86  Temp:    98.4 F (36.9 C)  TempSrc:    Oral  Resp: _0 Height:    _1  (1.651 m)  Weight:    261 lb 0.4 oz (118.4 kg)  SpO2: 91% 91% 92% 79%   Weight change:   Intake/Output Summary (Last 24 hours) at 09/02/14 1629 Last data filed at 09/02/14 1200  Gross per 24 hour  Intake    250 ml  Output   3770 ml  Net  -3520 ml   General: Obese Caucasian female, resting in bed, 4 L O2 by nasal cannula HEENT: PERRL, EOMI, no scleral icterus, oropharynx clear Cardiac: RRR, no rubs, murmurs or gallops Pulm: clear to auscultation bilaterally in the posterior lung fields, no wheezes, rales, or rhonchi Abd: soft, nontender, nondistended, BS present Ext: warm and well perfused, 1+ pitting edema right greater than left, right lower extremity with induration Neuro: responds to questions  appropriately; moving all extremities freely   Lab Results: Basic Metabolic Panel:  Recent Labs Lab 09/02/14 0358 09/02/14 1200  NA 138 142  K 3.3* 4.0  CL 91* 87*  CO2 39* 43*  GLUCOSE 263* 175*  BUN <5* <5*  CREATININE 0.94 0.87  CALCIUM 8.6* 9.0   CBC:  Recent Labs Lab 09/01/14 2209  WBC 7.1  NEUTROABS 4.9  HGB 12.9  HCT 42.3  MCV 102.9*  PLT 146*   Cardiac Enzymes:  Recent Labs Lab 09/01/14 2200 09/02/14 0358 09/02/14 1200  TROPONINI 0.03 <0.03 <0.03   CBG:  Recent Labs Lab 09/02/14 0739 09/02/14 1139 09/02/14 1605  GLUCAP 234* 187* 147*     Micro Results: Recent Results (from the past 240 hour(s))  MRSA PCR Screening     Status: None   Collection Time: 09/02/14  5:56 AM  Result Value Ref Range Status   MRSA by PCR NEGATIVE NEGATIVE Final    Comment:        The GeneXpert MRSA Assay (FDA approved for NASAL specimens only), is one component of a comprehensive MRSA colonization surveillance program. It is not intended to diagnose MRSA infection nor to guide or monitor treatment for MRSA infections.    Studies/Results: Ct Angio Chest Pe W/cm &/or Wo Cm  09/02/2014   CLINICAL DATA:  Right-sided chest pain. Dizziness, lightheaded and weakness. Shortness of breath. Symptoms for  7-10 days.  EXAM: CT ANGIOGRAPHY CHEST WITH CONTRAST  TECHNIQUE: Multidetector CT imaging of the chest was performed using the standard protocol during bolus administration of intravenous contrast. Multiplanar CT image reconstructions and MIPs were obtained to evaluate the vascular anatomy.  CONTRAST:  29m OMNIPAQUE IOHEXOL 350 MG/ML SOLN  COMPARISON:  Chest radiograph earlier this day.  Chest CT 09/07/2010  FINDINGS: There are no filling defects within the pulmonary arteries to suggest pulmonary embolus.  There is moderate multi chamber cardiomegaly. Enlargement of the main pulmonary artery measuring 4.1 cm. The thoracic aorta is normal in caliber with mild atherosclerosis.  Coronary artery calcifications are seen.  Diminutive right pleural effusion. Small amount of fluid in the right minor fissure. No left pleural effusion. Physiologic pericardial fluid without effusion.  Prevascular lymph node measures 10 mm in short axis dimension. Right upper paratracheal lymph node measures 7 mm in short axis dimension. Right lower paratracheal lymph node measures 11 mm in short axis dimension. Left hilar lymph node measures 7 mm.  Within the left lower lobe there is a 2.9 x 2.0 x 2.8 cm nodular opacity with surrounding ill-defined margins and mild surrounding ground-glass opacity. Mild smooth septal thickening is seen. There is scattered linear atelectasis without definite additional pulmonary nodule. Trachea and mainstem bronchi are patent.  Evaluation of the upper abdomen demonstrates postsurgical change at the gastroesophageal junction with small hiatal hernia. Multiple surgical clips in the left upper quadrant of the abdomen. No definite acute abnormality.  Degenerative change in the spine. No lytic or blastic osseous lesion.  Review of the MIP images confirms the above findings.  IMPRESSION: 1. No pulmonary embolus. 2. Left lower lobe 2.9 cm nodular opacity. Surrounding ill-defined margins raise concern for malignancy, however infectious or inflammatory etiologies are also considered. If there signs or symptoms concerning for infection, follow-up CT after course of treatment is recommended. Further characterization could be considered with PET-CT. Shotty mediastinal lymph nodes that are nonspecific. 3. Multi chamber cardiomegaly with dilatation of the main pulmonary artery, can be seen with pulmonary arterial hypertension. 4. Small right pleural effusion and fluid in the minor fissure. Minimal septal thickening. Findings may reflect fluid overload/mild CHF.   Electronically Signed   By: MJeb LeveringM.D.   On: 09/02/2014 03:35   Dg Chest Portable 1 View  09/01/2014   CLINICAL DATA:   Acute onset of shortness of breath and right-sided chest pain. Initial encounter.  EXAM: PORTABLE CHEST - 1 VIEW  COMPARISON:  Chest radiograph performed 07/15/2014  FINDINGS: The lungs are well-aerated. Vascular congestion is noted. A small right pleural effusion is noted. Increased interstitial markings raise concern for pulmonary edema. No pneumothorax is seen.  The cardiomediastinal silhouette is enlarged. No acute osseous abnormalities are seen.  IMPRESSION: Vascular congestion and cardiomegaly. Small right pleural effusion noted. Increased interstitial markings raise concern for pulmonary edema.   Electronically Signed   By: JGarald BaldingM.D.   On: 09/01/2014 22:20   Medications: I have reviewed the patient's current medications. Scheduled Meds: . aspirin  81 mg Oral Daily  . enoxaparin (LOVENOX) injection  0.5 mg/kg Subcutaneous Q24H  . furosemide  40 mg Intravenous Q12H  . insulin aspart  0-9 Units Subcutaneous TID WC  . mometasone-formoterol  2 puff Inhalation BID  . pantoprazole  40 mg Oral Daily  . pravastatin  40 mg Oral q1800  . sodium chloride  3 mL Intravenous Q12H  . tiotropium  18 mcg Inhalation Daily   Continuous Infusions:  PRN Meds:.acetaminophen **OR** acetaminophen, ipratropium-albuterol Assessment/Plan:  Acute on chronic hypoxic, hypercapnic respiratory failure: Likely secondary to acute CHF exacerbation given the improvement in her respiratory symptoms with Lasix, chest x-ray findings on admission. No evidence of DVT on lower extremity Dopplers. Prior hospitalizations have been notable for COPD exacerbations though currently she does not have symptoms of wheezing or productive cough. Weight today 261 pounds, down from 266 pounds, with -1.1 L out today, and net -3.3 L since admission. -Continue Lasix 40 mg IV 2 with plan to transition to home Lasix tomorrow  Chest pain: Etiology unclear though resolved at bedside. EKG changes initially suggestive of cardiac etiology  though troponins were negative 3. -Monitor on telemetry  New pulmonary nodule: CT of admission notable for 2.9 cm nodule concerning for malignancy.  -Arrange for outpatient oncology follow-up -Consult social work for additional help as barriers to her ability to make it to referral appointments included cost and transportation  Chronic combined CHF: Echo notable for EF 50-55% with grade 1 diastolic dysfunction in February 2016. Her medications include Lasix 40 mg. -As noted above  COPD Gold stage IV: PFTs notable for FEV1/FVC 61%, postbronchodilator FEV1 44%, in September 2010. She is on 4 L of oxygen at home and continues to smoke cigarettes. Her medications include Advair 1 puff twice daily, Spiriva 18 g inhaler daily, albuterol 2 puffs every 6 hours as needed for shortness of breath. -Continue home medications -Continue oxygen for goal O2 sats 88-92%  Coronary artery disease: She underwent angioplasty of LAD with drug-eluting stent and was noted to have 100% occlusion of RCA as of 2010. Her medications include aspirin 81 mg, lovastatin 20 mg at bedtime. -Continue pravastatin and all other home medications besides lovastatin  Hyperlipidemia: Lipid panel August 2014 notable for total cholesterol 237, LDL 136, HDL 30, triglycerides 356. -Statin as noted above  Hyperglycemia: Glucose trending mostly in upper 100s to mid 200s. A1c 5.6, November 2015. -Check CBG before meals and at bedtime -Continue SSI-S -Follow-up A1c  #FEN:  -Diet: Heart healthy/carb modified  #DVT prophylaxis: Lovenox  #CODE STATUS: FULL CODE -Confirmed with patient on admission  Dispo: Disposition is deferred at this time, awaiting improvement of current medical problems.   The patient does have a current PCP Oval Linsey, MD) and does need an [facility] hospital follow-up appointment after discharge.  The patient does have transportation limitations that hinder transportation to clinic appointments.  .Services  Needed at time of discharge: Y = Yes, Blank = No PT:   OT:   RN:   Equipment:   Other:     LOS: 1 day   Riccardo Dubin, MD 09/02/2014, 4:29 PM

## 2014-09-02 NOTE — ED Notes (Addendum)
Pt placed on Venturi mask at 14 liters. Dr Daryll Drown ordered that we can hold off on bipap right now, if pt maintains sat above 84% and remains stable then the pt can remain on the venturi mask. Pt is stable right now, no sob. Pt to have CT angio, pt not going to telementary bed at this time.

## 2014-09-02 NOTE — Progress Notes (Signed)
Right Lower Ext. Venous Duplex Completed. Negative for deep or superficial vein thrombosis in the right lower extremity. Oda Cogan, BS, RDMS, RVT

## 2014-09-02 NOTE — Patient Outreach (Signed)
Call received from Dr. Posey Pronto regarding patient involvement with [facility].  Dr. Posey Pronto notified that the patient went through with the transition of care program after the last hospitalization, but refused any further involvement with [facility].  Patient readmitted to hospital on yesterday with CHF exacerbation.  Dr. Posey Pronto expresses concern regarding patient's self care management and transportation to physician appointments.  Made aware that [facility] does assist with getting transportation set up for appointments, [facility] and my appointmate explained.  Dr. Posey Pronto also made aware that a financial needs assessment was done by the social worker and that the patient did not qualify for financial assistance.    Dr. Posey Pronto states that he will speak with the patient again tomorrow regarding involvement with [facility].  Informed that if the patient is agreeable to services, to place consult for further evaluation.    Will notify hospital liaison of patient admission.  Valente David, BSN, [facility]  [facility] Care Manager [phone]

## 2014-09-02 NOTE — Progress Notes (Signed)
Pt refused HS dose of Lasix. Pt wishes to take her Lasix earlier in the evening (dinner time). Will continue to monitor.

## 2014-09-02 NOTE — ED Notes (Signed)
Pt's O2 sat ranging from 83-88% on 6L [place], admitting MD in room. Respiratory tech in room to obtain ABG. Pt has a bed, admitting MD requested that I not call report yet until he finishes his assessment.

## 2014-09-02 NOTE — Progress Notes (Signed)
Inpatient Diabetes Program Recommendations  AACE/ADA: New Consensus Statement on Inpatient Glycemic Control (2013)  Target Ranges:  Prepandial:   less than 140 mg/dL      Peak postprandial:   less than 180 mg/dL (1-2 hours)      Critically ill patients:  140 - 180 mg/dL   Reason for Assessment: Hyperglycemia  Diabetes history: Pre-diabetes Outpatient Diabetes medications: None Current orders for Inpatient glycemic control: Novolog sensitive tidwc  Results for [name], [name] (MRN [id number]) as of 09/02/2014 13:22  Ref. Range 09/02/2014 07:39 09/02/2014 11:39  Glucose-Capillary Latest Ref Range: 65-99 mg/dL 234 (H) 187 (H)  Results for [name], [name] (MRN [id number]) as of 09/02/2014 13:22  Ref. Range 09/01/2014 22:09 09/02/2014 03:58 09/02/2014 12:00  Glucose Latest Ref Range: 65-99 mg/dL 123 (H) 263 (H) 175 (H)   Results for [name], [name] (MRN [id number]) as of 09/02/2014 13:22  Ref. Range 02/10/2014 10:44  Hemoglobin A1C Unknown 5.6   Received Solumedrol 125 mg by EMS at admission   Inpatient Diabetes Program Recommendations Insulin - Basal: If FBS >180 mg/dL, consider addition of Lantus 15 units QHS Correction (SSI): Increase Novolog to moderate tidwc and hs HgbA1C: Pending  Note: Will f/u when results in for HgbA1C.  Thank you. Lorenda Peck, RD, LDN, CDE Inpatient Diabetes Coordinator [phone]

## 2014-09-02 NOTE — ED Notes (Signed)
Pt back in room with this RN

## 2014-09-02 NOTE — Procedures (Signed)
Pt taken off BIPAP and placed on Venturi mask set at 55% with 14L for transport. Pt transported to 2S16 by RN and RT without complications.  Pt left on venturi and resting comfortably with HR-88, RR-16 and SpO2-93. BIPAP placed on standby in pt's room and unit RT notified.

## 2014-09-03 DIAGNOSIS — R739 Hyperglycemia, unspecified: Secondary | ICD-10-CM

## 2014-09-03 LAB — HEMOGLOBIN A1C
Hgb A1c MFr Bld: 6.2 % — ABNORMAL HIGH (ref 4.8–5.6)
MEAN PLASMA GLUCOSE: 131 mg/dL

## 2014-09-03 LAB — COMPREHENSIVE METABOLIC PANEL
ALBUMIN: 2.7 g/dL — AB (ref 3.5–5.0)
ALT: 11 U/L — ABNORMAL LOW (ref 14–54)
ANION GAP: 6 (ref 5–15)
AST: 8 U/L — ABNORMAL LOW (ref 15–41)
Alkaline Phosphatase: 92 U/L (ref 38–126)
BILIRUBIN TOTAL: 0.5 mg/dL (ref 0.3–1.2)
BUN: 6 mg/dL (ref 6–20)
CALCIUM: 8.6 mg/dL — AB (ref 8.9–10.3)
CO2: 48 mmol/L — AB (ref 22–32)
Chloride: 86 mmol/L — ABNORMAL LOW (ref 101–111)
Creatinine, Ser: 0.82 mg/dL (ref 0.44–1.00)
GFR calc Af Amer: >60 mL/min (ref >60)
GFR calc non Af Amer: >60 mL/min (ref >60)
Glucose, Bld: 136 mg/dL — ABNORMAL HIGH (ref 65–99)
Potassium: 4 mmol/L (ref 3.5–5.1)
Sodium: 140 mmol/L (ref 135–145)
TOTAL PROTEIN: 7.1 g/dL (ref 6.5–8.1)

## 2014-09-03 LAB — MAGNESIUM: MAGNESIUM: 1.7 mg/dL (ref 1.7–2.4)

## 2014-09-03 LAB — GLUCOSE, CAPILLARY
GLUCOSE-CAPILLARY: 114 mg/dL — AB (ref 65–99)
Glucose-Capillary: 134 mg/dL — ABNORMAL HIGH (ref 65–99)
Glucose-Capillary: 128 mg/dL — ABNORMAL HIGH (ref 65–99)
Glucose-Capillary: 121 mg/dL — ABNORMAL HIGH (ref 65–99)

## 2014-09-03 LAB — HIV ANTIBODY (ROUTINE TESTING W REFLEX): HIV Screen 4th Generation wRfx: NONREACTIVE

## 2014-09-03 MED ORDER — FUROSEMIDE 10 MG/ML IJ SOLN
40.0000 mg | Freq: Two times a day (BID) | INTRAMUSCULAR | Status: DC
  Administered 2014-09-03 – 2014-09-04 (×3): 40 mg via INTRAVENOUS
  Filled 2014-09-03 (×4): qty 4

## 2014-09-03 MED ORDER — IPRATROPIUM-ALBUTEROL 0.5-2.5 (3) MG/3ML IN SOLN
3.0000 mL | RESPIRATORY_TRACT | Status: DC
  Administered 2014-09-03 – 2014-09-04 (×5): 3 mL via RESPIRATORY_TRACT
  Filled 2014-09-03 (×6): qty 3

## 2014-09-03 NOTE — Progress Notes (Signed)
Patient seen and examined. Case d/w residents in detail on morning rounds. I agree with findings and plan as documented in Dr. Brandt Loosen note.  Patient with mildly worsening SOB today after refusing her lasix last night.  Will c/w IV lasix today and monitor I's and O's.   Patient also noted to have large pulm nodule - 2.9 cms which is concerning for malignancy. Will need outpatient PET scan and pulm f/u.

## 2014-09-03 NOTE — Progress Notes (Signed)
Subjective: Patient did not take her lasix last night because it was too late and she did not want to urinate whole night. She feels her breathing is worse compared to yesterday but not as bad as admission. Has some mild leg swelling. No other complaints. Patient feels scared about her lung mass.    Objective: Vital signs in last 24 hours: Filed Vitals:   09/03/14 0736 09/03/14 1056 09/03/14 1200 09/03/14 1428  BP:  118/74  128/66  Pulse:  79  84  Temp:    97.3 F (36.3 C)  TempSrc:    Axillary  Resp:    18  Height:      Weight:      SpO2: 92%  83% 90%   Weight change: -5 lb 11.7 oz (-2.6 kg)  Intake/Output Summary (Last 24 hours) at 09/03/14 1505 Last data filed at 09/03/14 1057  Gross per 24 hour  Intake    794 ml  Output    200 ml  Net    594 ml   General: Obese Caucasian female, resting in bed, 4 L O2 by nasal cannula HEENT: PERRL, EOMI, no scleral icterus, oropharynx clear Cardiac: RRR, no rubs, murmurs or gallops Pulm: decreased BS, some mild crackles on the bases, no wheezing.  Abd: soft, nontender, nondistended, BS present Ext: warm and well perfused, 1+ pitting edema, equal in size.  Neuro: responds to questions appropriately; moving all extremities freely   Lab Results: Basic Metabolic Panel:  Recent Labs Lab 09/02/14 1200 09/03/14 0540  NA 142 140  K 4.0 4.0  CL 87* 86*  CO2 43* 48*  GLUCOSE 175* 136*  BUN <5* 6  CREATININE 0.87 0.82  CALCIUM 9.0 8.6*  MG  --  1.7   CBC:  Recent Labs Lab 09/01/14 2209  WBC 7.1  NEUTROABS 4.9  HGB 12.9  HCT 42.3  MCV 102.9*  PLT 146*   Cardiac Enzymes:  Recent Labs Lab 09/01/14 2200 09/02/14 0358 09/02/14 1200  TROPONINI 0.03 <0.03 <0.03   CBG:  Recent Labs Lab 09/02/14 0739 09/02/14 1139 09/02/14 1605 09/02/14 2115 09/03/14 0630 09/03/14 1055  GLUCAP 234* 187* 147* 153* 134* 128*     Micro Results: Recent Results (from the past 240 hour(s))  Culture, blood (routine x 2)      Status: None (Preliminary result)   Collection Time: 09/01/14 10:00 PM  Result Value Ref Range Status   Specimen Description BLOOD RIGHT ARM  Final   Special Requests   Final    BOTTLES DRAWN AEROBIC AND ANAEROBIC 5CC RED 10CC BLUE   Culture   Final           BLOOD CULTURE RECEIVED NO GROWTH TO DATE CULTURE WILL BE HELD FOR 5 DAYS BEFORE ISSUING A FINAL NEGATIVE REPORT Performed at [facility]    Report Status PENDING  Incomplete  Culture, blood (routine x 2)     Status: None (Preliminary result)   Collection Time: 09/01/14 10:10 PM  Result Value Ref Range Status   Specimen Description BLOOD RIGHT HAND  Final   Special Requests BOTTLES DRAWN AEROBIC ONLY [place]  Final   Culture   Final           BLOOD CULTURE RECEIVED NO GROWTH TO DATE CULTURE WILL BE HELD FOR 5 DAYS BEFORE ISSUING A FINAL NEGATIVE REPORT Performed at [facility]    Report Status PENDING  Incomplete  MRSA PCR Screening     Status: None   Collection  Time: 09/02/14  5:56 AM  Result Value Ref Range Status   MRSA by PCR NEGATIVE NEGATIVE Final    Comment:        The GeneXpert MRSA Assay (FDA approved for NASAL specimens only), is one component of a comprehensive MRSA colonization surveillance program. It is not intended to diagnose MRSA infection nor to guide or monitor treatment for MRSA infections.    Studies/Results: Ct Angio Chest Pe W/cm &/or Wo Cm  09/02/2014   CLINICAL DATA:  Right-sided chest pain. Dizziness, lightheaded and weakness. Shortness of breath. Symptoms for 7-10 days.  EXAM: CT ANGIOGRAPHY CHEST WITH CONTRAST  TECHNIQUE: Multidetector CT imaging of the chest was performed using the standard protocol during bolus administration of intravenous contrast. Multiplanar CT image reconstructions and MIPs were obtained to evaluate the vascular anatomy.  CONTRAST:  23m OMNIPAQUE IOHEXOL 350 MG/ML SOLN  COMPARISON:  Chest radiograph earlier this day.  Chest CT 09/07/2010  FINDINGS: There are  no filling defects within the pulmonary arteries to suggest pulmonary embolus.  There is moderate multi chamber cardiomegaly. Enlargement of the main pulmonary artery measuring 4.1 cm. The thoracic aorta is normal in caliber with mild atherosclerosis. Coronary artery calcifications are seen.  Diminutive right pleural effusion. Small amount of fluid in the right minor fissure. No left pleural effusion. Physiologic pericardial fluid without effusion.  Prevascular lymph node measures 10 mm in short axis dimension. Right upper paratracheal lymph node measures 7 mm in short axis dimension. Right lower paratracheal lymph node measures 11 mm in short axis dimension. Left hilar lymph node measures 7 mm.  Within the left lower lobe there is a 2.9 x 2.0 x 2.8 cm nodular opacity with surrounding ill-defined margins and mild surrounding ground-glass opacity. Mild smooth septal thickening is seen. There is scattered linear atelectasis without definite additional pulmonary nodule. Trachea and mainstem bronchi are patent.  Evaluation of the upper abdomen demonstrates postsurgical change at the gastroesophageal junction with small hiatal hernia. Multiple surgical clips in the left upper quadrant of the abdomen. No definite acute abnormality.  Degenerative change in the spine. No lytic or blastic osseous lesion.  Review of the MIP images confirms the above findings.  IMPRESSION: 1. No pulmonary embolus. 2. Left lower lobe 2.9 cm nodular opacity. Surrounding ill-defined margins raise concern for malignancy, however infectious or inflammatory etiologies are also considered. If there signs or symptoms concerning for infection, follow-up CT after course of treatment is recommended. Further characterization could be considered with PET-CT. Shotty mediastinal lymph nodes that are nonspecific. 3. Multi chamber cardiomegaly with dilatation of the main pulmonary artery, can be seen with pulmonary arterial hypertension. 4. Small right pleural  effusion and fluid in the minor fissure. Minimal septal thickening. Findings may reflect fluid overload/mild CHF.   Electronically Signed   By: MJeb LeveringM.D.   On: 09/02/2014 03:35   Dg Chest Portable 1 View  09/01/2014   CLINICAL DATA:  Acute onset of shortness of breath and right-sided chest pain. Initial encounter.  EXAM: PORTABLE CHEST - 1 VIEW  COMPARISON:  Chest radiograph performed 07/15/2014  FINDINGS: The lungs are well-aerated. Vascular congestion is noted. A small right pleural effusion is noted. Increased interstitial markings raise concern for pulmonary edema. No pneumothorax is seen.  The cardiomediastinal silhouette is enlarged. No acute osseous abnormalities are seen.  IMPRESSION: Vascular congestion and cardiomegaly. Small right pleural effusion noted. Increased interstitial markings raise concern for pulmonary edema.   Electronically Signed   By: JFrancoise SchaumannD.  On: 09/01/2014 22:20   Medications: I have reviewed the patient's current medications. Scheduled Meds: . aspirin  81 mg Oral Daily  . enoxaparin (LOVENOX) injection  0.5 mg/kg Subcutaneous Q24H  . furosemide  40 mg Intravenous BID  . insulin aspart  0-15 Units Subcutaneous TID WC  . ipratropium-albuterol  3 mL Nebulization Q4H  . mometasone-formoterol  2 puff Inhalation BID  . pantoprazole  40 mg Oral Daily  . pravastatin  40 mg Oral q1800  . sodium chloride  3 mL Intravenous Q12H  . tiotropium  18 mcg Inhalation Daily   Continuous Infusions:  PRN Meds:.acetaminophen **OR** acetaminophen, alum & mag hydroxide-simeth, ipratropium-albuterol Assessment/Plan:  Acute on chronic hypoxic, hypercapnic respiratory failure: Likely secondary to acute CHF exacerbation given the improvement in her respiratory symptoms with Lasix, chest x-ray findings on admission and BNP elevation to 900's. She missed her lasix at home for few days as she was not feeling well.  Echo notable for EF 50-55% with grade 1 diastolic  dysfunction in February 2016.    No evidence of DVT on lower extremity Dopplers. Prior hospitalizations have been notable for COPD exacerbations though currently she does not have symptoms of wheezing or productive cough. Weight today 261 pounds, down from 266 pounds, with -3 L since admission.  - Continue Lasix 40 mg IV 2 (switched Pm dose to 5 pm). May transition to PO tomorrow.  Chest pain - resolved, likely 2/2 to volume overload. Etiology unclear though resolved at bedside. EKG changes initially suggestive of cardiac etiology though troponins were negative 3. -Monitor on telemetry  New pulmonary nodule: CT of admission notable for 2.9 cm nodule concerning for malignancy, has some lymphadenopathies as well.  - will need PET outpatient. Used to see Dr. Joya Gaskins pulm, will make appt for follow up with Dr. Joya Gaskins for lung nodule evaluation.  COPD Gold stage IV: PFTs notable for FEV1/FVC 61%, postbronchodilator FEV1 44%, in September 2010. She is on 4 L of oxygen at home and continues to smoke cigarettes. Her medications include Advair 1 puff twice daily, Spiriva 18 g inhaler daily, albuterol 2 puffs every 6 hours as needed for shortness of breath. -Continue home medications -Continue oxygen for goal O2 sats 88-92%  Coronary artery disease: She underwent angioplasty of LAD with drug-eluting stent and was noted to have 100% occlusion of RCA as of 2010. Her medications include aspirin 81 mg, lovastatin 20 mg at bedtime. -Continue pravastatin and all other home medications besides lovastatin  Hyperlipidemia: Lipid panel August 2014 notable for total cholesterol 237, LDL 136, HDL 30, triglycerides 356. -Statin as noted above  Hyperglycemia: Glucose trending mostly in upper 100s to mid 200s. A1c 5.6, November 2015.  hgba1c rechecked 6.2 -Continue SSI-S  #FEN:  -Diet: Heart healthy/carb modified  #DVT prophylaxis: Lovenox  #CODE STATUS: FULL CODE -Confirmed with patient on  admission  Dispo: Disposition is deferred at this time, awaiting improvement of current medical problems.   The patient does have a current PCP Oval Linsey, MD) and does need an [facility] hospital follow-up appointment after discharge.  The patient does have transportation limitations that hinder transportation to clinic appointments.  .Services Needed at time of discharge: Y = Yes, Blank = No PT:   OT:   RN:   Equipment:   Other:     LOS: 2 days   Dellia Nims, MD 09/03/2014, 3:05 PM

## 2014-09-03 NOTE — Clinical Documentation Improvement (Signed)
Current chart reflects 'Obesity'.  Per nursing documentation, height 5 feet 5 inches, weight 261.0, BMI 43.5.  Please clarify if the patient's obesity can be further clarified and if so, please document in your progress notes and carry over to the discharge summary.  Possible Clinical Conditions: -Morbid Obesity -Obesity only as currently documented -Other condition (please specify)  Thank you, Mateo Flow, RN [phone] Clinical Documentation Specialist

## 2014-09-03 NOTE — Patient Outreach (Signed)
[facility]) Care Management  09/03/2014  JAENA BROCATO December 10, 1955 [id number]   Consult received for patient from MD, message sent to [place] to engage patient for [place].  Ronnell Freshwater. [place], [place] Management [place] Assistant Phone: [phone] Fax: [phone]

## 2014-09-03 NOTE — Progress Notes (Signed)
Subjective:    Patient said she felt "terrible" last night, did not sleep well and complained of dry cough. She is short of breath with minimal movement. She denies any chest pain, dizziness, light-headedness, hemoptysis, or sputum production. She has decreased appetite, about stable from baseline. No other complaints.    Objective:    Vital Signs:   Temp:  [97.3 F (36.3 C)-99 F (37.2 C)] 97.3 F (36.3 C) (06/01 1428) Pulse Rate:  [79-87] 84 (06/01 1428) Resp:  [18-21] 18 (06/01 1428) BP: (118-133)/(47-77) 128/66 mmHg (06/01 1428) SpO2:  [83 %-92 %] 89 % (06/01 1609) FiO2 (%):  [45 %] 45 % (06/01 1609) Weight:  [118.4 kg (261 lb 0.4 oz)] 118.4 kg (261 lb 0.4 oz) (06/01 0533) Last BM Date: 09/02/14  24-hour weight change: Weight change: -2.6 kg (-5 lb 11.7 oz)  Intake/Output:   Intake/Output Summary (Last 24 hours) at 09/03/14 1635 Last data filed at 09/03/14 1621  Gross per 24 hour  Intake    794 ml  Output    700 ml  Net     94 ml      Physical Exam: General: Appears chronically ill and fatigued, resting in bed, on 4L oxygen by [place].  Lungs:  Decreased BS bilaterally, bibasilar crackles, no wheezing noted  Heart: RRR. S1 and S2 normal without gallop, murmur, or rubs.  Abdomen:  BS normoactive. Soft, nondistended, non-tender.  No masses or organomegaly.  Extremities: Neuro: Warm and well perfused,  1+ symmetrical pitting edema to knees bilaterally.  Responds to questions appropriately, moves all extremities equally, no gross deficits     Labs:  Basic Metabolic Panel:  Recent Labs Lab 09/01/14 2209 09/02/14 0358 09/02/14 1200 09/03/14 0540  NA 138 138 142 140  K 3.5 3.3* 4.0 4.0  CL 92* 91* 87* 86*  CO2 42* 39* 43* 48*  GLUCOSE 123* 263* 175* 136*  BUN <5* <5* <5* 6  CREATININE 0.80 0.94 0.87 0.82  CALCIUM 8.7* 8.6* 9.0 8.6*  MG  --   --   --  1.7    Liver Function Tests:  Recent Labs Lab 09/03/14 0540  AST 8*  ALT 11*  ALKPHOS 92  BILITOT  0.5  PROT 7.1  ALBUMIN 2.7*   CBC:  Recent Labs Lab 09/01/14 2209  WBC 7.1  NEUTROABS 4.9  HGB 12.9  HCT 42.3  MCV 102.9*  PLT 146*    Cardiac Enzymes:  Recent Labs Lab 09/01/14 2200 09/02/14 0358 09/02/14 1200  TROPONINI 0.03 <0.03 <0.03    CBG:  Recent Labs Lab 09/02/14 1139 09/02/14 1605 09/02/14 2115 09/03/14 0630 09/03/14 1055  GLUCAP 187* 147* 153* 134* 128*    Microbiology: Results for orders placed or performed during the hospital encounter of 09/01/14  Culture, blood (routine x 2)     Status: None (Preliminary result)   Collection Time: 09/01/14 10:00 PM  Result Value Ref Range Status   Specimen Description BLOOD RIGHT ARM  Final   Special Requests   Final    BOTTLES DRAWN AEROBIC AND ANAEROBIC 5CC RED 10CC BLUE   Culture   Final           BLOOD CULTURE RECEIVED NO GROWTH TO DATE CULTURE WILL BE HELD FOR 5 DAYS BEFORE ISSUING A FINAL NEGATIVE REPORT Performed at [facility]    Report Status PENDING  Incomplete  Culture, blood (routine x 2)     Status: None (Preliminary result)   Collection Time: 09/01/14 10:10 PM  Result Value Ref Range Status   Specimen Description BLOOD RIGHT HAND  Final   Special Requests BOTTLES DRAWN AEROBIC ONLY [place]  Final   Culture   Final           BLOOD CULTURE RECEIVED NO GROWTH TO DATE CULTURE WILL BE HELD FOR 5 DAYS BEFORE ISSUING A FINAL NEGATIVE REPORT Performed at [facility]    Report Status PENDING  Incomplete  MRSA PCR Screening     Status: None   Collection Time: 09/02/14  5:56 AM  Result Value Ref Range Status   MRSA by PCR NEGATIVE NEGATIVE Final    Comment:        The GeneXpert MRSA Assay (FDA approved for NASAL specimens only), is one component of a comprehensive MRSA colonization surveillance program. It is not intended to diagnose MRSA infection nor to guide or monitor treatment for MRSA infections.    Imaging: No new imaging    Medications:    Infusions:     Scheduled Medications: . aspirin  81 mg Oral Daily  . enoxaparin (LOVENOX) injection  0.5 mg/kg Subcutaneous Q24H  . furosemide  40 mg Intravenous BID  . insulin aspart  0-15 Units Subcutaneous TID WC  . ipratropium-albuterol  3 mL Nebulization Q4H  . mometasone-formoterol  2 puff Inhalation BID  . pantoprazole  40 mg Oral Daily  . pravastatin  40 mg Oral q1800  . sodium chloride  3 mL Intravenous Q12H  . tiotropium  18 mcg Inhalation Daily    PRN Medications: acetaminophen **OR** acetaminophen, alum & mag hydroxide-simeth, ipratropium-albuterol   Assessment/ Plan:   59 year old woman with PMHx most significant for severe COPD on home oxygen 4L [place] and dCHF who presents with acute on chronic hypoxic and hypercapnic respiratory failure and new finding of a 2.9 cm pulmonary nodule.   Principal Problem:   Acute on chronic respiratory failure with hypercapnia Active Problems:   Obesity   TOBACCO ABUSE   Coronary atherosclerosis   Severe chronic obstructive pulmonary disease   GASTROESOPHAGEAL REFLUX DISEASE   Sleep apnea   Hyperlipidemia   Chronic diastolic CHF (congestive heart failure)   Chest pain   Pain of right lower extremity  Assessment/Plan:  Acute on chronic hypoxic, hypercapnic respiratory failure:  Most likely secondary to acute CHF exacerbation given fluid overload on physical exam, CXR, and BNP elevation to 900s on admission. Patient requires Lasix 40 mg daily at baseline and missed two doses prior to admission. Echo in Feb 2016 notable for EF 50-55% with grade I systolic failure. Currently diuresing, patient is net -3.4 liters and net -2.6 kg since admission. No PM Lasix dose 5/31 as patient declined.   Patient has multiple past hospitalizations for COPD exacerbations but has no wheezing or productive cough during this episode.  - Continue Lasix 40 mg IV 2 (switched PM dose to 5 pm per patient request). May transition to PO tomorrow. - PT for  deconditioning  Chest pain - resolved, likely 2/2 cardiac stress in setting of volume overload given EKG with new ST depression and t-wave inversions . Etiology unclear though resolved at bedside. Troponins negative x3 - Telemetry  New pulmonary nodule: CT of admission notable for 2.9 cm nodule concerning for malignancy, has some lymphadenopathies as well.  - will need PET outpatient. Used to see Dr. Joya Gaskins pulm, will make appt for follow up with Dr. Joya Gaskins for lung nodule evaluation.  COPD Gold stage IV: PFTs notable for FEV1/FVC 61%, postbronchodilator FEV1  44%, in September 2010. She is on 4 L of oxygen at home and continues to smoke cigarettes. Her home medications include Advair 1 puff twice daily, Spiriva 18 g inhaler daily, albuterol 2 puffs every 6 hours as needed for shortness of breath. -Continue home medications -Continue oxygen for goal O2 sats 88-92%   Coronary artery disease: She underwent angioplasty of LAD with drug-eluting stent and was noted to have 100% occlusion of RCA as of 2010. Her medications include aspirin 81 mg, lovastatin 20 mg at bedtime. -Continue pravastatin, all other home meds except lovastatin  Hyperlipidemia: Lipid panel August 2014 notable for total cholesterol 237, LDL 136, HDL 30, triglycerides 356. -Statin as noted above  Hyperglycemia: Glucose trending mostly in the mid 100s. HbA1c 6.2%. Well-controlled - SSI  FEN:  -Diet: Heart healthy/carb modified  DVT prophylaxis: Lovenox  CODE STATUS: FULL CODE -Confirmed with patient on admission  Dispo: Disposition is deferred at this time, awaiting improvement of current medical problems.   Length of Stay: 2 day(s)   Signed: Betsey Amen, MS4

## 2014-09-04 ENCOUNTER — Other Ambulatory Visit: Payer: Self-pay | Admitting: *Deleted

## 2014-09-04 ENCOUNTER — Telehealth: Payer: Self-pay | Admitting: *Deleted

## 2014-09-04 DIAGNOSIS — J449 Chronic obstructive pulmonary disease, unspecified: Secondary | ICD-10-CM

## 2014-09-04 DIAGNOSIS — K219 Gastro-esophageal reflux disease without esophagitis: Secondary | ICD-10-CM

## 2014-09-04 DIAGNOSIS — E785 Hyperlipidemia, unspecified: Secondary | ICD-10-CM

## 2014-09-04 LAB — GLUCOSE, CAPILLARY
Glucose-Capillary: 136 mg/dL — ABNORMAL HIGH (ref 65–99)
Glucose-Capillary: 162 mg/dL — ABNORMAL HIGH (ref 65–99)

## 2014-09-04 MED ORDER — ATORVASTATIN CALCIUM 40 MG PO TABS: 40.0000 mg | ORAL_TABLET | Freq: Every day | ORAL | Status: DC

## 2014-09-04 MED ORDER — TIOTROPIUM BROMIDE MONOHYDRATE 18 MCG IN CAPS: 18.0000 ug | ORAL_CAPSULE | Freq: Two times a day (BID) | RESPIRATORY_TRACT | Status: DC

## 2014-09-04 MED ORDER — ALBUTEROL SULFATE (2.5 MG/3ML) 0.083% IN NEBU: 2.5000 mg | INHALATION_SOLUTION | Freq: Four times a day (QID) | RESPIRATORY_TRACT | Status: DC | PRN

## 2014-09-04 MED ORDER — ALBUTEROL SULFATE HFA 108 (90 BASE) MCG/ACT IN AERS: 2.0000 | INHALATION_SPRAY | Freq: Four times a day (QID) | RESPIRATORY_TRACT | Status: DC | PRN

## 2014-09-04 MED ORDER — FUROSEMIDE 40 MG PO TABS: 40.0000 mg | ORAL_TABLET | Freq: Two times a day (BID) | ORAL | Status: DC

## 2014-09-04 MED ORDER — FLUTICASONE-SALMETEROL 250-50 MCG/DOSE IN AEPB: 1.0000 | INHALATION_SPRAY | Freq: Two times a day (BID) | RESPIRATORY_TRACT | Status: DC

## 2014-09-04 NOTE — Progress Notes (Signed)
Patient seen and examined. Case d/w residents in detail. I agree with findings and plan as documented in Dr. Brandt Loosen note.   Patient is much improved today and is stable for d/c home on PO lasix (40 mg BID).  Outpatient f/u with [facility] and with pulm (for pulm nodule)

## 2014-09-04 NOTE — Progress Notes (Signed)
Respiratory staff placed Venturi mask at 55% on pt r/t O2 sats 83.  Physician called and notified.

## 2014-09-04 NOTE — Progress Notes (Signed)
Subjective: Doing well today. Breathing is better. No problems overnight.  Today 253lb (down from 261). -888 last 24hrs.   Objective: Vital signs in last 24 hours: Filed Vitals:   09/03/14 2152 09/04/14 0121 09/04/14 0423 09/04/14 0513  BP:  104/63  125/62  Pulse:  94  91  Temp:  97.8 F (36.6 C)  99.1 F (37.3 C)  TempSrc:  Oral  Tympanic  Resp:  20  20  Height:      Weight:    253 lb 9.6 oz (115.032 kg)  SpO2: 93% 91% 93% 92%   Weight change: -7 lb 6.8 oz (-3.368 kg)  Intake/Output Summary (Last 24 hours) at 09/04/14 0950 Last data filed at 09/04/14 0939  Gross per 24 hour  Intake   1030 ml  Output   1900 ml  Net   -870 ml   General: Obese Caucasian female, resting in bed, 4 L O2 by nasal cannula HEENT: PERRL, EOMI, no scleral icterus, oropharynx clear Cardiac: RRR, no rubs, murmurs or gallops Pulm: decreased BS, some mild crackles on the bases, no wheezing.  Abd: soft, nontender, nondistended, BS present Ext: warm and well perfused,trace edema, equal in size.  Neuro: responds to questions appropriately; moving all extremities freely   Lab Results: Basic Metabolic Panel:  Recent Labs Lab 09/02/14 1200 09/03/14 0540  NA 142 140  K 4.0 4.0  CL 87* 86*  CO2 43* 48*  GLUCOSE 175* 136*  BUN <5* 6  CREATININE 0.87 0.82  CALCIUM 9.0 8.6*  MG  --  1.7   CBC:  Recent Labs Lab 09/01/14 2209  WBC 7.1  NEUTROABS 4.9  HGB 12.9  HCT 42.3  MCV 102.9*  PLT 146*   Cardiac Enzymes:  Recent Labs Lab 09/01/14 2200 09/02/14 0358 09/02/14 1200  TROPONINI 0.03 <0.03 <0.03   CBG:  Recent Labs Lab 09/02/14 2115 09/03/14 0630 09/03/14 1055 09/03/14 1621 09/03/14 2133 09/04/14 0649  GLUCAP 153* 134* 128* 114* 121* 136*     Micro Results: Recent Results (from the past 240 hour(s))  Culture, blood (routine x 2)     Status: None (Preliminary result)   Collection Time: 09/01/14 10:00 PM  Result Value Ref Range Status   Specimen Description BLOOD  RIGHT ARM  Final   Special Requests   Final    BOTTLES DRAWN AEROBIC AND ANAEROBIC 5CC RED 10CC BLUE   Culture   Final           BLOOD CULTURE RECEIVED NO GROWTH TO DATE CULTURE WILL BE HELD FOR 5 DAYS BEFORE ISSUING A FINAL NEGATIVE REPORT Performed at [facility]    Report Status PENDING  Incomplete  Culture, blood (routine x 2)     Status: None (Preliminary result)   Collection Time: 09/01/14 10:10 PM  Result Value Ref Range Status   Specimen Description BLOOD RIGHT HAND  Final   Special Requests BOTTLES DRAWN AEROBIC ONLY [place]  Final   Culture   Final           BLOOD CULTURE RECEIVED NO GROWTH TO DATE CULTURE WILL BE HELD FOR 5 DAYS BEFORE ISSUING A FINAL NEGATIVE REPORT Performed at [facility]    Report Status PENDING  Incomplete  MRSA PCR Screening     Status: None   Collection Time: 09/02/14  5:56 AM  Result Value Ref Range Status   MRSA by PCR NEGATIVE NEGATIVE Final    Comment:        The GeneXpert  MRSA Assay (FDA approved for NASAL specimens only), is one component of a comprehensive MRSA colonization surveillance program. It is not intended to diagnose MRSA infection nor to guide or monitor treatment for MRSA infections.    Studies/Results: No results found. Medications: I have reviewed the patient's current medications. Scheduled Meds: . aspirin  81 mg Oral Daily  . enoxaparin (LOVENOX) injection  0.5 mg/kg Subcutaneous Q24H  . furosemide  40 mg Intravenous BID  . insulin aspart  0-15 Units Subcutaneous TID WC  . ipratropium-albuterol  3 mL Nebulization Q4H  . mometasone-formoterol  2 puff Inhalation BID  . pantoprazole  40 mg Oral Daily  . pravastatin  40 mg Oral q1800  . sodium chloride  3 mL Intravenous Q12H  . tiotropium  18 mcg Inhalation Daily   Continuous Infusions:  PRN Meds:.acetaminophen **OR** acetaminophen, alum & mag hydroxide-simeth, ipratropium-albuterol Assessment/Plan:  Acute on chronic hypoxic, hypercapnic  respiratory failure: Likely secondary to acute CHF exacerbation given the improvement in her respiratory symptoms with Lasix, chest x-ray findings on admission and BNP elevation to 900's. She missed her lasix at home for few days as she was not feeling well.  Echo notable for EF 50-55% with grade 1 diastolic dysfunction in February 2016.    No evidence of DVT on lower extremity Dopplers. Prior hospitalizations have been notable for COPD exacerbations though currently she does not have symptoms of wheezing or productive cough. Weight today 261 pounds, down from 266 pounds, with -3 L since admission.  - diuresed well with IV lasix 44m BID. Weight is down breathing is better. Will send out today on 454mPO lasix BID (used to be on once a day 4059m This can be adjusted on follow up next week at the clinic.  Chest pain - resolved, likely 2/2 to volume overload.  troponins were negative 3.   New pulmonary nodule: CT of admission notable for 2.9 cm nodule concerning for malignancy, has some lymphadenopathies as well.  - will need PET outpatient. Used to see Dr. WriJoya Gaskinslm, will make appt for follow up with Dr. WriJoya Gaskinsr lung nodule evaluation.  COPD Gold stage IV: PFTs notable for FEV1/FVC 61%, postbronchodilator FEV1 44%, in September 2010. She is on 4 L of oxygen at home and continues to smoke cigarettes. Her medications include Advair 1 puff twice daily, Spiriva 18 g inhaler daily, albuterol 2 puffs every 6 hours as needed for shortness of breath. -Continue home medications -Continue oxygen for goal O2 sats 88-92%  Coronary artery disease: She underwent angioplasty of LAD with drug-eluting stent and was noted to have 100% occlusion of RCA as of 2010. Her medications include aspirin 81 mg, lipitor 65m47mily - cont home meds on discharge.  Hyperlipidemia: Lipid panel August 2014 notable for total cholesterol 237, LDL 136, HDL 30, triglycerides 356. -Statin as noted above  Hyperglycemia:  Glucose trending mostly in low 100's. A1c 5.6, November 2015.  hgba1c rechecked 6.2. Doing well.  #FEN:  -Diet: Heart healthy/carb modified  #DVT prophylaxis: Lovenox  #CODE STATUS: FULL CODE -Confirmed with patient on admission  Dispo: d/c home today.  The patient does have a current PCP (LawOval Linsey) and [facility] discharge.  The patient does have transportation limitations that hinder transportation to clinic appointments.  .Services Needed at time of discharge: Y = Yes, Blank = No PT:   OT:   RN:   Equipment:   Other:     LOS: 3 days  Dellia Nims, MD 09/04/2014, 9:50 AM

## 2014-09-04 NOTE — Discharge Summary (Signed)
Name: [name] MRN: [id number] DOB: Nov 27, 1955 59 y.o. PCP: Oval Linsey, MD  Date of Admission: 09/01/2014  9:28 PM Date of Discharge: 09/04/2014 Attending Physician: Aldine Contes, MD  Discharge Diagnosis:  Principal Problem:   Acute on chronic respiratory failure with hypercapnia Active Problems:   Obesity   TOBACCO ABUSE   Coronary atherosclerosis   Severe chronic obstructive pulmonary disease   GASTROESOPHAGEAL REFLUX DISEASE   Sleep apnea   Hyperlipidemia   Chronic diastolic CHF (congestive heart failure)   Chest pain   Pain of right lower extremity  Discharge Medications:   Medication List    STOP taking these medications        lovastatin 20 MG tablet  Commonly known as:  MEVACOR      TAKE these medications        albuterol 108 (90 BASE) MCG/ACT inhaler  Commonly known as:  PROVENTIL HFA;VENTOLIN HFA  Inhale 2 puffs into the lungs every 6 (six) hours as needed for wheezing or shortness of breath.     albuterol (2.5 MG/3ML) 0.083% nebulizer solution  Commonly known as:  PROVENTIL  Take 3 mLs (2.5 mg total) by nebulization every 6 (six) hours as needed for wheezing or shortness of breath.     aspirin 81 MG chewable tablet  Chew 1 tablet (81 mg total) by mouth daily.     atorvastatin 40 MG tablet  Commonly known as:  LIPITOR  Take 1 tablet (40 mg total) by mouth daily.     BC HEADACHE PO  Take 1 Package by mouth daily as needed (pain).     Fluticasone-Salmeterol 250-50 MCG/DOSE Aepb  Commonly known as:  ADVAIR DISKUS  Inhale 1 puff into the lungs 2 (two) times daily.     furosemide 40 MG tablet  Commonly known as:  LASIX  Take 1 tablet (40 mg total) by mouth 2 (two) times daily. Please note higher strength tablet.  Only take 1 tablet daily.     naproxen 500 MG tablet  Commonly known as:  NAPROSYN  Take 1 tablet by mouth as needed.     pantoprazole 40 MG tablet  Commonly known as:  PROTONIX  Take 1 tablet (40 mg total) by mouth daily.       tiotropium 18 MCG inhalation capsule  Commonly known as:  SPIRIVA HANDIHALER  Place 1 capsule (18 mcg total) into inhaler and inhale 2 (two) times daily.        Disposition and follow-up:   [name] was discharged from [facility] in Stable condition.  At the hospital follow up visit please address:  1.  Please assess her lasix dose. We are sending her out on 24m lasix PO BID. She used to be on 475mdaily in the past. She does have hx of dehydration from lasix. Please adjust it as appropriate.  Please make sure her lung nodule is followed closely. We have made appt with Dr. WrJoya Gaskinspulm).  2.  Labs / imaging needed at time of follow-up: PET scan for lung nodule  3.  Pending labs/ test needing follow-up:   Follow-up Appointments: Follow-up Information    Follow up with PaAsencion NobleMD On 09/24/2014.   Specialty:  Pulmonary Disease   Why:  11:15 am   Contact information:   [address] [place] [id number]     Follow up with JoLuanne BrasMD On 09/11/2014.   Specialty:  Internal Medicine   Why:  @  10:15am   Contact information:   [place] [id number] [phone]       Discharge Instructions:   Consultations:    Procedures Performed:  Ct Angio Chest Pe W/cm &/or Wo Cm  09/02/2014   CLINICAL DATA:  Right-sided chest pain. Dizziness, lightheaded and weakness. Shortness of breath. Symptoms for 7-10 days.  EXAM: CT ANGIOGRAPHY CHEST WITH CONTRAST  TECHNIQUE: Multidetector CT imaging of the chest was performed using the standard protocol during bolus administration of intravenous contrast. Multiplanar CT image reconstructions and MIPs were obtained to evaluate the vascular anatomy.  CONTRAST:  64m OMNIPAQUE IOHEXOL 350 MG/ML SOLN  COMPARISON:  Chest radiograph earlier this day.  Chest CT 09/07/2010  FINDINGS: There are no filling defects within the pulmonary arteries to suggest pulmonary embolus.  There is moderate  multi chamber cardiomegaly. Enlargement of the main pulmonary artery measuring 4.1 cm. The thoracic aorta is normal in caliber with mild atherosclerosis. Coronary artery calcifications are seen.  Diminutive right pleural effusion. Small amount of fluid in the right minor fissure. No left pleural effusion. Physiologic pericardial fluid without effusion.  Prevascular lymph node measures 10 mm in short axis dimension. Right upper paratracheal lymph node measures 7 mm in short axis dimension. Right lower paratracheal lymph node measures 11 mm in short axis dimension. Left hilar lymph node measures 7 mm.  Within the left lower lobe there is a 2.9 x 2.0 x 2.8 cm nodular opacity with surrounding ill-defined margins and mild surrounding ground-glass opacity. Mild smooth septal thickening is seen. There is scattered linear atelectasis without definite additional pulmonary nodule. Trachea and mainstem bronchi are patent.  Evaluation of the upper abdomen demonstrates postsurgical change at the gastroesophageal junction with small hiatal hernia. Multiple surgical clips in the left upper quadrant of the abdomen. No definite acute abnormality.  Degenerative change in the spine. No lytic or blastic osseous lesion.  Review of the MIP images confirms the above findings.  IMPRESSION: 1. No pulmonary embolus. 2. Left lower lobe 2.9 cm nodular opacity. Surrounding ill-defined margins raise concern for malignancy, however infectious or inflammatory etiologies are also considered. If there signs or symptoms concerning for infection, follow-up CT after course of treatment is recommended. Further characterization could be considered with PET-CT. Shotty mediastinal lymph nodes that are nonspecific. 3. Multi chamber cardiomegaly with dilatation of the main pulmonary artery, can be seen with pulmonary arterial hypertension. 4. Small right pleural effusion and fluid in the minor fissure. Minimal septal thickening. Findings may reflect fluid  overload/mild CHF.   Electronically Signed   By: MJeb LeveringM.D.   On: 09/02/2014 03:35   Dg Chest Portable 1 View  09/01/2014   CLINICAL DATA:  Acute onset of shortness of breath and right-sided chest pain. Initial encounter.  EXAM: PORTABLE CHEST - 1 VIEW  COMPARISON:  Chest radiograph performed 07/15/2014  FINDINGS: The lungs are well-aerated. Vascular congestion is noted. A small right pleural effusion is noted. Increased interstitial markings raise concern for pulmonary edema. No pneumothorax is seen.  The cardiomediastinal silhouette is enlarged. No acute osseous abnormalities are seen.  IMPRESSION: Vascular congestion and cardiomegaly. Small right pleural effusion noted. Increased interstitial markings raise concern for pulmonary edema.   Electronically Signed   By: JGarald BaldingM.D.   On: 09/01/2014 22:20    Admission HPI:   Ms PFitzwateris a 59year old woman with combined CHF, COPD, CAD s/p DES, HL who presents with SOB and weakness. She says that she has  had progressive SOB for the past week to week and a half. She has also had generalized weakness and says for the past four days she has been very somnolent. She has not taken her medicines other than inhalers for the past two days, including missing lasix. She says for the past week she has used her albuterol nebulizer about once a day. She also notes R sided aching chest pain that does through to the right side of her back. She says this is more of a chronic pain but that it has occurred more frequently the past week. She is unsure if anything such as exertion, breathing, eating, motions make it better or worse. She also notes that her R leg is achy but does not think she has increased swelling. She does not think her two pillow orthopnea has changed. She also says she has had a subjective chill. A few days ago she had four loose bowel movements but this has resolved. She denies any cough, abdominal pain, nausea, emesis, constipation, dysuria,  focal weakness, numbness.   EMS gave her solumedrol 125 mg iv once and a duoneb treatment when her initial oxygen level was in the 80s and placed her on nonrebreather and came up to 93%. She says she has been out of lasix for about four days.  In the ED, she received lasix 80 mg iv once, duoneb x 1. She was sating in the 80s on 6L nasal cannula during our interview and we placed her on venturimask.  Hospital Course by problem list:  Acute on chronic hypoxic, hypercapnic respiratory failure: Likely secondary to acute CHF exacerbation given the improvement in her respiratory symptoms with Lasix, chest x-ray findings on admission and BNP elevation to 900's. She missed her lasix at home for few days as she was not feeling well.  Echo notable for EF 50-55% with grade 1 diastolic dysfunction in February 2016.   No evidence of DVT on lower extremity Dopplers. Prior hospitalizations have been notable for COPD exacerbations though currently she does not have symptoms of wheezing or productive cough. Weight today 261 pounds, down from 266 pounds, with -3 L since admission.  - diuresed well with IV lasix 56m BID. Weight is down breathing is better. Will send out today on 42mPO lasix BID (used to be on once a day 4062m This can be adjusted on follow up next week at the clinic.  Chest pain - resolved, likely 2/2 to volume overload. troponins were negative 3.  New pulmonary nodule: CT of admission notable for 2.9 cm nodule concerning for malignancy, has some lymphadenopathies as well.  - will need PET outpatient. Used to see Dr. WriJoya Gaskinslm, will make appt for follow up with Dr. WriJoya Gaskinsr lung nodule evaluation.  COPD Gold stage IV: PFTs notable for FEV1/FVC 61%, postbronchodilator FEV1 44%, in September 2010. She is on 4 L of oxygen at home and continues to smoke cigarettes. Her medications include Advair 1 puff twice daily, Spiriva 18 g inhaler daily, albuterol 2 puffs every 6 hours as needed for  shortness of breath. -Continue home medications -Continue oxygen for goal O2 sats 88-92%  Coronary artery disease: She underwent angioplasty of LAD with drug-eluting stent and was noted to have 100% occlusion of RCA as of 2010. Her medications include aspirin 81 mg, lipitor 90m55mily - cont home meds on discharge.  Hyperlipidemia: Lipid panel August 2014 notable for total cholesterol 237, LDL 136, HDL 30, triglycerides 356. -Statin as noted above  Hyperglycemia:  Glucose trending mostly in low 100's. A1c 5.6, November 2015.  hgba1c rechecked 6.2. Doing well.  Discharge Vitals:   BP 122/62 mmHg  Pulse 86  Temp(Src) 99.1 F (37.3 C) (Tympanic)  Resp 20  Ht 5' 5" (1.651 m)  Wt 253 lb 9.6 oz (115.032 kg)  BMI 42.20 kg/m2  SpO2 92%  Discharge Labs:  Results for orders placed or performed during the hospital encounter of 09/01/14 (from the past 24 hour(s))  Glucose, capillary     Status: Abnormal   Collection Time: 09/03/14  4:21 PM  Result Value Ref Range   Glucose-Capillary 114 (H) 65 - 99 mg/dL   Comment 1 Notify RN   Glucose, capillary     Status: Abnormal   Collection Time: 09/03/14  9:33 PM  Result Value Ref Range   Glucose-Capillary 121 (H) 65 - 99 mg/dL  Glucose, capillary     Status: Abnormal   Collection Time: 09/04/14  6:49 AM  Result Value Ref Range   Glucose-Capillary 136 (H) 65 - 99 mg/dL  Glucose, capillary     Status: Abnormal   Collection Time: 09/04/14 11:21 AM  Result Value Ref Range   Glucose-Capillary 162 (H) 65 - 99 mg/dL    Signed: Dellia Nims, MD 09/04/2014, 1:38 PM    Services Ordered on Discharge:  Equipment Ordered on Discharge:

## 2014-09-04 NOTE — Consult Note (Signed)
   [facility] CM Inpatient Consult   09/04/2014  [name] 10-10-1955 [id number] Spoke with patient at bedside regarding being restarted with [facility] services. Patient states she feels like she is getting enough calls between her Doctor, general practice and from [place] and she did not feel she needed home visits.  She states she is in need of transportation assistance and hopes that [facility] can assist with this.  She states that she has some transportation from her insurance at one time but was not sure of it continuing.  She is agreeable to community Social worker assessment for her transportation needs through [facility].   Consent form signed. Patient states she has at lease two [facility] folders in her home already.  Of note, [facility] Care Management services does not replace or interfere with any services that are arranged by inpatient case management or social work. Will also follow up with [facility] regarding post hospital follow up calls. For additional questions or referrals please contact: Natividad Brood, RN BSN [facility] Liaison  [phone] business mobile phone

## 2014-09-04 NOTE — Discharge Instructions (Signed)
Start taking Lasix 36m twice a day until you follow up at our clinic.  Keep our other meds the same.  Follow up with your lung doctor about your lung nodule.

## 2014-09-04 NOTE — Telephone Encounter (Signed)
Opened in error.Morgan Hidden Cassady6/2/20164:27 PM

## 2014-09-04 NOTE — Telephone Encounter (Signed)
Dr Genene Churn, none of pt's scripts at discharge went to pharmacy, they all failed due to system dysfunction, could you please check them and resend them, the spiriva is written for 2 x daily, is this correct? thanks

## 2014-09-04 NOTE — Progress Notes (Signed)
Orders received for pt discharge.  Discharge summary printed and reviewed with pt.  Explained medication regimen, and pt had no further questions at this time.  IV removed and site remains clean, dry, intact.  Telemetry removed.  Pt in stable condition and awaiting transport.

## 2014-09-05 ENCOUNTER — Other Ambulatory Visit: Payer: Self-pay | Admitting: Internal Medicine

## 2014-09-05 ENCOUNTER — Telehealth: Payer: Self-pay | Admitting: Pharmacist

## 2014-09-05 DIAGNOSIS — R0902 Hypoxemia: Secondary | ICD-10-CM | POA: Diagnosis not present

## 2014-09-05 DIAGNOSIS — J449 Chronic obstructive pulmonary disease, unspecified: Secondary | ICD-10-CM

## 2014-09-05 DIAGNOSIS — J9600 Acute respiratory failure, unspecified whether with hypoxia or hypercapnia: Secondary | ICD-10-CM | POA: Diagnosis not present

## 2014-09-05 MED ORDER — FLUTICASONE-SALMETEROL 250-50 MCG/DOSE IN AEPB: 1.0000 | INHALATION_SPRAY | Freq: Two times a day (BID) | RESPIRATORY_TRACT | Status: DC

## 2014-09-05 MED ORDER — ALBUTEROL SULFATE (2.5 MG/3ML) 0.083% IN NEBU
2.5000 mg | INHALATION_SOLUTION | Freq: Four times a day (QID) | RESPIRATORY_TRACT | Status: DC | PRN
Start: 2014-09-05 — End: 2014-09-05

## 2014-09-05 MED ORDER — TIOTROPIUM BROMIDE MONOHYDRATE 18 MCG IN CAPS: 18.0000 ug | ORAL_CAPSULE | Freq: Every day | RESPIRATORY_TRACT | Status: DC

## 2014-09-05 MED ORDER — ALBUTEROL SULFATE HFA 108 (90 BASE) MCG/ACT IN AERS: 2.0000 | INHALATION_SPRAY | Freq: Four times a day (QID) | RESPIRATORY_TRACT | Status: DC | PRN

## 2014-09-05 MED ORDER — ATORVASTATIN CALCIUM 40 MG PO TABS: 40.0000 mg | ORAL_TABLET | Freq: Every day | ORAL | Status: DC

## 2014-09-05 MED ORDER — PANTOPRAZOLE SODIUM 40 MG PO TBEC: 40.0000 mg | DELAYED_RELEASE_TABLET | Freq: Every day | ORAL | Status: DC

## 2014-09-05 NOTE — Telephone Encounter (Signed)
Patient was contacted for clinical pharmacist support during transition of care from hospital to home.  Medications were reviewed with the patient, including name, instructions, indication, goals of therapy, potential side effects, importance of adherence, and safe use.   Dose was clarified for furosemide and patient is taking 40 mg BID. Patient reports mild cramping in her legs and planning to decrease furosemide dose. Encouraged adherence to furosemide regimen considering recent CHF exacerbation and advised patient to seek medical attention if symptoms worsen/persist rather than non-adherence. Patient verbalized understanding and does have a primary care follow up appointment on 09/11/14. Also provided information on dietary sources of potassium.  Otherwise, patient reports no known adherence challenges at this time.

## 2014-09-08 ENCOUNTER — Telehealth: Payer: Self-pay | Admitting: *Deleted

## 2014-09-08 LAB — CULTURE, BLOOD (ROUTINE X 2)
CULTURE: NO GROWTH
CULTURE: NO GROWTH

## 2014-09-08 NOTE — Telephone Encounter (Addendum)
Started the atorva Friday PM and dizzy for first time Sat AM. Stays all day long. No N/V/weakness/falls/CP. No other sxs. Never had this before. To stop the atova and ask Edd Fabian to call her in the AM. If no better, will need appt.

## 2014-09-08 NOTE — Telephone Encounter (Signed)
Pt called with c/o Lipitor making her feel dizzy, room is spinning.  Onset a few hours after taking the med at bedtime and she wakes up feeling like this in AM.  The feeling does not stop.  Other meds also cause dizziness.   She has a f/u appointment on 6/9 with Dr Ronnald Ramp. Please advise

## 2014-09-09 ENCOUNTER — Other Ambulatory Visit: Payer: Self-pay | Admitting: Internal Medicine

## 2014-09-09 NOTE — Telephone Encounter (Signed)
Talked with pt and she feels much better today.  She will stay off Lipitor.

## 2014-09-09 NOTE — Telephone Encounter (Signed)
Than you. Will enter as allergy

## 2014-09-09 NOTE — Telephone Encounter (Signed)
Pt called and no answer.  Message left to call clinic with BP reading and update on sx.

## 2014-09-11 ENCOUNTER — Encounter: Payer: Self-pay | Admitting: Internal Medicine

## 2014-09-11 ENCOUNTER — Ambulatory Visit (INDEPENDENT_AMBULATORY_CARE_PROVIDER_SITE_OTHER): Payer: Commercial Managed Care - HMO | Admitting: Internal Medicine

## 2014-09-11 VITALS — BP 129/73 | HR 81 | Temp 98.0°F | Ht 65.0 in | Wt 256.3 lb

## 2014-09-11 DIAGNOSIS — E785 Hyperlipidemia, unspecified: Secondary | ICD-10-CM | POA: Diagnosis not present

## 2014-09-11 DIAGNOSIS — I5033 Acute on chronic diastolic (congestive) heart failure: Secondary | ICD-10-CM

## 2014-09-11 DIAGNOSIS — I509 Heart failure, unspecified: Secondary | ICD-10-CM | POA: Diagnosis not present

## 2014-09-11 LAB — BASIC METABOLIC PANEL WITH GFR
BUN: 5 mg/dL — AB (ref 6–23)
CO2: 36 meq/L — AB (ref 19–32)
Calcium: 8.9 mg/dL (ref 8.4–10.5)
Chloride: 91 mEq/L — ABNORMAL LOW (ref 96–112)
Creat: 0.8 mg/dL (ref 0.50–1.10)
GFR, EST NON AFRICAN AMERICAN: 80 mL/min
GFR, Est African American: >89 mL/min
Glucose, Bld: 133 mg/dL — ABNORMAL HIGH (ref 70–99)
POTASSIUM: 3.7 meq/L (ref 3.5–5.3)
SODIUM: 138 meq/L (ref 135–145)

## 2014-09-11 MED ORDER — FUROSEMIDE 40 MG PO TABS: 40.0000 mg | ORAL_TABLET | Freq: Every day | ORAL | Status: DC

## 2014-09-11 MED ORDER — LOVASTATIN 40 MG PO TABS
40.0000 mg | ORAL_TABLET | Freq: Every day | ORAL | Status: DC
Start: 2014-09-11 — End: 2015-04-08

## 2014-09-11 NOTE — Progress Notes (Signed)
Subjective:   Patient ID: LAKETTA SODERBERG female   DOB: 01-Aug-1955 59 y.o.   MRN: [id number]  HPI: Ms. DAYNA ALIA is a 59 y.o. female w/ PMHx of HTN, HLD, CAD (s/p DES to LAD a/ chronic total occlusion of RCA, 2010), pulmonary HTN, COPD, chronic dCHF, and GERD, presents to the clinic today for a hospital follow-up visit. Patient was recently admitted w/ acute respiratory failure in the setting of an acute diastolic CHF exacerbation. She was discharged on an increased dose of Lasix at 40 mg bid. She has been taking this except for the past 2 days she went back to her normal dose of 40 mg daily because she felt like she was too dry. She denies any further difficulty w/ her breathing that is different from her baseline (uses 4L O2 at home). She denies any worsening DOE, cough, chest pain, and states that her LE edema has completely resolved. She also states that she is sleeping as comfortably as she normally would. The patient was also changed to Lipitor 40 mg (from lovastatin 20 mg) and states she started to have dizziness and lightheadedness with this so she has stopped taking it.    Current Outpatient Prescriptions  Medication Sig Dispense Refill  . albuterol (PROVENTIL) (2.5 MG/3ML) 0.083% nebulizer solution Take 3 mLs (2.5 mg total) by nebulization every 6 (six) hours as needed for shortness of breath. 360 mL 3  . aspirin 81 MG chewable tablet Chew 1 tablet (81 mg total) by mouth daily. (Patient taking differently: Chew 81 mg by mouth at bedtime. ) 30 tablet 11  . Aspirin-Salicylamide-Caffeine (BC HEADACHE PO) Take 1 Package by mouth daily as needed (pain).    . Fluticasone-Salmeterol (ADVAIR DISKUS) 250-50 MCG/DOSE AEPB Inhale 1 puff into the lungs 2 (two) times daily. 60 each 5  . furosemide (LASIX) 40 MG tablet Take 1 tablet (40 mg total) by mouth daily. 30 tablet   . lovastatin (MEVACOR) 40 MG tablet Take 1 tablet (40 mg total) by mouth at bedtime. 30 tablet 5  . naproxen (NAPROSYN) 500 MG  tablet Take 1 tablet by mouth as needed.  0  . pantoprazole (PROTONIX) 40 MG tablet Take 1 tablet (40 mg total) by mouth daily. 30 tablet 0  . tiotropium (SPIRIVA HANDIHALER) 18 MCG inhalation capsule Place 1 capsule (18 mcg total) into inhaler and inhale daily. 30 capsule 12   No current facility-administered medications for this visit.   Review of Systems  General: Denies fever, diaphoresis, appetite change, and fatigue.  Respiratory: Denies SOB, cough, and wheezing.   Cardiovascular: Denies chest pain and palpitations.  Gastrointestinal: Denies nausea, vomiting, abdominal pain, and diarrhea Musculoskeletal: Denies myalgias, arthralgias, back pain, and gait problem.  Neurological: Positive for dizziness. Denies syncope, weakness, lightheadedness, and headaches.  Psychiatric/Behavioral: Denies mood changes, sleep disturbance, and agitation.   Objective:   Physical Exam: Filed Vitals:   09/11/14 1028  BP: 129/73  Pulse: 81  Temp: 98 F (36.7 C)  TempSrc: Oral  Height: _0  (1.651 m)  Weight: 256 lb 4.8 oz (116.257 kg)  SpO2: 94%    General: White female, alert, cooperative, NAD. On 4L O2 via [place].  HEENT: PERRL, EOMI. Moist mucus membranes Neck: Full range of motion without pain, supple, no lymphadenopathy or carotid bruits Lungs: Air entry equal bilaterally. Mild crackles in the left lung base, otherwise clear to auscultation.  Heart: RRR, no murmurs, gallops, or rubs Abdomen: Soft, non-tender, non-distended, BS + Extremities: No cyanosis or  clubbing. Trace pitting edema. Skin dry.  Neurologic: Alert & oriented x3, cranial nerves II-XII intact, strength grossly intact, sensation intact to light touch   Assessment & Plan:   Please see problem based assessment and plan.

## 2014-09-11 NOTE — Assessment & Plan Note (Signed)
Patient w/ dizziness related to atorvastatin 40 mg qhs, changed from lovastatin 20 mg qhs on hospital discharge. Atorvastatin discontinued, listed as an allergy in her chart.  -Restart Lovastatin at 40 mg qhs. Can discuss optimal statin therapy w/ PCP at next clinic visit.

## 2014-09-11 NOTE — Assessment & Plan Note (Signed)
Patient recently admitted for CHF exacerbation. Diuresed well in the hospital, discharged on increased dose of Lasix, ie: 40 mg bid. States she has been doing this since her discharge until 2 days ago when she went back to her previous dose of Lasix 40 mg daily because she felt like the twice daily dosing was too much. On exam today, patient looks to be at her dry weight. Only trace pitting edema in LE's, pulmonary exam w/ only mild crackles in the left base (patient w/ known h/o lung disease). No obvious JVD on exam. BP normal.  -Repeat BMP today.  -Advised patient it is okay to take Lasix 40 mg daily at this time. She is to go home and weigh herself and this will count as her dry weight. If her weight increases >5 lbs, she is to take Lasix 40 mg bid for 1-2 days based on her weight. -Patient will follow up w/ Dr. Joya Gaskins and Dr. Percival Spanish in the next 1-2 months.  -RTC in 6-8 weeks (after above follow up appointments)

## 2014-09-11 NOTE — Progress Notes (Signed)
Medicine attending: Medical history, presenting problems, physical findings, and medications, reviewed with Dr Luanne Bras and I concur with his evaluation and management plan.

## 2014-09-11 NOTE — Patient Instructions (Signed)
1. Please schedule follow up for 6-8 weeks.   2. Please take all medications as previously prescribed with the following changes:  Take Lasix 40 mg DAILY. If your weight increases 5 pounds or more from your dry weight (TODAY'S WEIGHT), take Lasix 40 mg TWICE DAILY FOR 1 OR 2 DAYS.   3. If you have worsening of your symptoms or new symptoms arise, please call the clinic ([phone]), or go to the ER immediately if symptoms are severe.  You have done a great job in taking all your medications. Please continue to do this.

## 2014-09-18 ENCOUNTER — Other Ambulatory Visit: Payer: Self-pay | Admitting: *Deleted

## 2014-09-18 NOTE — Patient Outreach (Addendum)
[facility]) Care Management  09/18/2014  [name] 07-Oct-1955 [id number]   Phone call to patient to provide referrals for transportation.  Patient states that she has had a negative experience with [employer] and has declined use.  Patient states she is aware of her [employer] transportation benefit and was also given contact information for [employer].  Per patient, the Human transportation picks her up 45 minutes before her appointment time and she runs low on her Oxygen.   Per patient, she plans to contact [employer] for enrollment into their transportation program.  Per patient, if she cannot get enrolled with [employer] before, her next appointment, her sister will come down from [place] to provide transportation.  Care management assistant to send patient community resources for transportation in [place].  Patient discussed having no further social work needs.  Case to be closed to social work.   Sheralyn Boatman [facility] Care Management [phone]

## 2014-09-18 NOTE — Patient Outreach (Signed)
[facility]) Care Management  09/18/2014  [name] [birth year] [id number]   Phone call to patient to follow up on questions regarding transportation needs.  Voicemail message left for a return call.     Sheralyn Boatman [facility] Care Management [phone]

## 2014-09-19 NOTE — Patient Outreach (Signed)
[facility]) Care Management  09/19/2014  [name] [birth year] [id number]   Notification received from [facility], LCSW to send community resources on transportation services in [place]. A list of transportation resources was mailed out to the patient on 09/19/2014.  Nance Mccombs L. [facility]ings [facility] Care Management Assistant [phone]

## 2014-09-22 ENCOUNTER — Telehealth: Payer: Self-pay | Admitting: *Deleted

## 2014-09-22 NOTE — Telephone Encounter (Signed)
Call from [place] with [facility] care management # [phone] She reports pt is taking ASA 81 mg daily and BC powers 3 -4 times day making total ASA amount of   2500 - 3300 mg  plus daily caffeine intake of  Over 195 mg.  Pt is not taking her lasix and continues to have swelling.        Last visit 6/9, at that visit she was taking lasix and only had trace pitting edema in LE. Will schedule pt with PCP.

## 2014-09-22 NOTE — Telephone Encounter (Signed)
Return call by Lelon Frohlich. Nurse reports pt states she does not want to stop BC powders because it helps.   She does not weigh daily, still smokes and does not follow diet.  Today pt states swelling is better and she is taking lasix again.

## 2014-09-23 ENCOUNTER — Encounter: Payer: Self-pay | Admitting: Internal Medicine

## 2014-09-24 ENCOUNTER — Inpatient Hospital Stay (HOSPITAL_COMMUNITY)
Admission: AD | Admit: 2014-09-24 | Discharge: 2014-09-27 | DRG: 291 | Disposition: A | Discharge status: Home / Self Care | Payer: Commercial Managed Care - HMO | Source: Ambulatory Visit | Attending: Critical Care Medicine | Admitting: Critical Care Medicine

## 2014-09-24 ENCOUNTER — Inpatient Hospital Stay (HOSPITAL_COMMUNITY): Payer: Commercial Managed Care - HMO

## 2014-09-24 ENCOUNTER — Encounter: Payer: Self-pay | Admitting: Critical Care Medicine

## 2014-09-24 ENCOUNTER — Ambulatory Visit (INDEPENDENT_AMBULATORY_CARE_PROVIDER_SITE_OTHER): Payer: Commercial Managed Care - HMO | Admitting: Critical Care Medicine

## 2014-09-24 VITALS — BP 130/74 | HR 94 | Temp 98.7°F | Ht 64.5 in | Wt 261.6 lb

## 2014-09-24 DIAGNOSIS — Z6841 Body Mass Index (BMI) 40.0 and over, adult: Secondary | ICD-10-CM | POA: Diagnosis not present

## 2014-09-24 DIAGNOSIS — I509 Heart failure, unspecified: Secondary | ICD-10-CM | POA: Diagnosis not present

## 2014-09-24 DIAGNOSIS — Z90710 Acquired absence of both cervix and uterus: Secondary | ICD-10-CM

## 2014-09-24 DIAGNOSIS — Z716 Tobacco abuse counseling: Secondary | ICD-10-CM | POA: Diagnosis present

## 2014-09-24 DIAGNOSIS — D696 Thrombocytopenia, unspecified: Secondary | ICD-10-CM | POA: Diagnosis not present

## 2014-09-24 DIAGNOSIS — Z7982 Long term (current) use of aspirin: Secondary | ICD-10-CM | POA: Diagnosis not present

## 2014-09-24 DIAGNOSIS — I5020 Unspecified systolic (congestive) heart failure: Secondary | ICD-10-CM | POA: Diagnosis not present

## 2014-09-24 DIAGNOSIS — K219 Gastro-esophageal reflux disease without esophagitis: Secondary | ICD-10-CM | POA: Diagnosis present

## 2014-09-24 DIAGNOSIS — E785 Hyperlipidemia, unspecified: Secondary | ICD-10-CM | POA: Diagnosis present

## 2014-09-24 DIAGNOSIS — I2510 Atherosclerotic heart disease of native coronary artery without angina pectoris: Secondary | ICD-10-CM | POA: Diagnosis present

## 2014-09-24 DIAGNOSIS — E669 Obesity, unspecified: Secondary | ICD-10-CM | POA: Diagnosis present

## 2014-09-24 DIAGNOSIS — J9621 Acute and chronic respiratory failure with hypoxia: Secondary | ICD-10-CM | POA: Diagnosis present

## 2014-09-24 DIAGNOSIS — Z888 Allergy status to other drugs, medicaments and biological substances status: Secondary | ICD-10-CM

## 2014-09-24 DIAGNOSIS — G459 Transient cerebral ischemic attack, unspecified: Secondary | ICD-10-CM | POA: Diagnosis not present

## 2014-09-24 DIAGNOSIS — T380X5A Adverse effect of glucocorticoids and synthetic analogues, initial encounter: Secondary | ICD-10-CM | POA: Diagnosis present

## 2014-09-24 DIAGNOSIS — R6889 Other general symptoms and signs: Secondary | ICD-10-CM | POA: Diagnosis not present

## 2014-09-24 DIAGNOSIS — J9811 Atelectasis: Secondary | ICD-10-CM | POA: Diagnosis not present

## 2014-09-24 DIAGNOSIS — J9601 Acute respiratory failure with hypoxia: Secondary | ICD-10-CM

## 2014-09-24 DIAGNOSIS — Z8614 Personal history of Methicillin resistant Staphylococcus aureus infection: Secondary | ICD-10-CM

## 2014-09-24 DIAGNOSIS — J9600 Acute respiratory failure, unspecified whether with hypoxia or hypercapnia: Secondary | ICD-10-CM | POA: Diagnosis present

## 2014-09-24 DIAGNOSIS — E873 Alkalosis: Secondary | ICD-10-CM | POA: Diagnosis present

## 2014-09-24 DIAGNOSIS — E876 Hypokalemia: Secondary | ICD-10-CM | POA: Diagnosis present

## 2014-09-24 DIAGNOSIS — I5033 Acute on chronic diastolic (congestive) heart failure: Principal | ICD-10-CM

## 2014-09-24 DIAGNOSIS — I272 Other secondary pulmonary hypertension: Secondary | ICD-10-CM | POA: Diagnosis not present

## 2014-09-24 DIAGNOSIS — Z955 Presence of coronary angioplasty implant and graft: Secondary | ICD-10-CM

## 2014-09-24 DIAGNOSIS — R069 Unspecified abnormalities of breathing: Secondary | ICD-10-CM | POA: Diagnosis not present

## 2014-09-24 DIAGNOSIS — F17210 Nicotine dependence, cigarettes, uncomplicated: Secondary | ICD-10-CM | POA: Diagnosis present

## 2014-09-24 DIAGNOSIS — R0602 Shortness of breath: Secondary | ICD-10-CM | POA: Diagnosis present

## 2014-09-24 DIAGNOSIS — R739 Hyperglycemia, unspecified: Secondary | ICD-10-CM | POA: Diagnosis present

## 2014-09-24 DIAGNOSIS — R911 Solitary pulmonary nodule: Secondary | ICD-10-CM | POA: Diagnosis present

## 2014-09-24 DIAGNOSIS — J441 Chronic obstructive pulmonary disease with (acute) exacerbation: Secondary | ICD-10-CM | POA: Diagnosis present

## 2014-09-24 DIAGNOSIS — J9622 Acute and chronic respiratory failure with hypercapnia: Secondary | ICD-10-CM | POA: Diagnosis not present

## 2014-09-24 LAB — CBC WITH DIFFERENTIAL/PLATELET
Basophils Absolute: 0 10*3/uL (ref 0.0–0.1)
Basophils Relative: 0 % (ref 0–1)
EOS ABS: 0.2 10*3/uL (ref 0.0–0.7)
Eosinophils Relative: 2 % (ref 0–5)
HCT: 42.8 % (ref 36.0–46.0)
Hemoglobin: 13.5 g/dL (ref 12.0–15.0)
LYMPHS PCT: 12 % (ref 12–46)
Lymphs Abs: 0.9 10*3/uL (ref 0.7–4.0)
MCH: 31.5 pg (ref 26.0–34.0)
MCHC: 31.5 g/dL (ref 30.0–36.0)
MCV: 100 fL (ref 78.0–100.0)
Monocytes Absolute: 0.6 10*3/uL (ref 0.1–1.0)
Monocytes Relative: 8 % (ref 3–12)
Neutro Abs: 5.8 10*3/uL (ref 1.7–7.7)
Neutrophils Relative %: 78 % — ABNORMAL HIGH (ref 43–77)
PLATELETS: 149 10*3/uL — AB (ref 150–400)
RBC: 4.28 MIL/uL (ref 3.87–5.11)
RDW: 14.1 % (ref 11.5–15.5)
WBC: 7.4 10*3/uL (ref 4.0–10.5)

## 2014-09-24 LAB — STREP PNEUMONIAE URINARY ANTIGEN: Strep Pneumo Urinary Antigen: NEGATIVE

## 2014-09-24 LAB — COMPREHENSIVE METABOLIC PANEL
ALK PHOS: 96 U/L (ref 38–126)
ALT: 8 U/L — AB (ref 14–54)
AST: 12 U/L — AB (ref 15–41)
Albumin: 2.9 g/dL — ABNORMAL LOW (ref 3.5–5.0)
Anion gap: 6 (ref 5–15)
BUN: <5 mg/dL — ABNORMAL LOW (ref 6–20)
CHLORIDE: 93 mmol/L — AB (ref 101–111)
CO2: 44 mmol/L — AB (ref 22–32)
Calcium: 8.6 mg/dL — ABNORMAL LOW (ref 8.9–10.3)
Creatinine, Ser: 0.77 mg/dL (ref 0.44–1.00)
GFR calc Af Amer: >60 mL/min (ref >60)
Glucose, Bld: 133 mg/dL — ABNORMAL HIGH (ref 65–99)
POTASSIUM: 3.2 mmol/L — AB (ref 3.5–5.1)
SODIUM: 143 mmol/L (ref 135–145)
Total Bilirubin: 0.5 mg/dL (ref 0.3–1.2)
Total Protein: 6.7 g/dL (ref 6.5–8.1)

## 2014-09-24 LAB — MAGNESIUM: Magnesium: 1.8 mg/dL (ref 1.7–2.4)

## 2014-09-24 LAB — PROTIME-INR
INR: 1.05 (ref 0.00–1.49)
Prothrombin Time: 13.9 seconds (ref 11.6–15.2)

## 2014-09-24 LAB — TROPONIN I
TROPONIN I: 0.03 ng/mL (ref <0.031)
Troponin I: 0.03 ng/mL (ref <0.031)

## 2014-09-24 LAB — BRAIN NATRIURETIC PEPTIDE: B Natriuretic Peptide: 743.9 pg/mL — ABNORMAL HIGH (ref 0.0–100.0)

## 2014-09-24 LAB — D-DIMER, QUANTITATIVE (NOT AT ARMC): D-Dimer, Quant: 0.5 ug/mL-FEU — ABNORMAL HIGH (ref 0.00–0.48)

## 2014-09-24 LAB — PROCALCITONIN

## 2014-09-24 LAB — LACTIC ACID, PLASMA: Lactic Acid, Venous: 1.2 mmol/L (ref 0.5–2.0)

## 2014-09-24 LAB — PHOSPHORUS: Phosphorus: 3 mg/dL (ref 2.5–4.6)

## 2014-09-24 LAB — GLUCOSE, CAPILLARY: Glucose-Capillary: 144 mg/dL — ABNORMAL HIGH (ref 65–99)

## 2014-09-24 LAB — MRSA PCR SCREENING: MRSA BY PCR: NEGATIVE

## 2014-09-24 MED ORDER — ASPIRIN 81 MG PO CHEW
81.0000 mg | CHEWABLE_TABLET | Freq: Every day | ORAL | Status: DC
  Administered 2014-09-24 – 2014-09-27 (×4): 81 mg via ORAL
  Filled 2014-09-24 (×5): qty 1

## 2014-09-24 MED ORDER — IPRATROPIUM-ALBUTEROL 0.5-2.5 (3) MG/3ML IN SOLN: 3.0000 mL | Freq: Four times a day (QID) | RESPIRATORY_TRACT | Status: DC

## 2014-09-24 MED ORDER — POTASSIUM CHLORIDE CRYS ER 20 MEQ PO TBCR
40.0000 meq | EXTENDED_RELEASE_TABLET | Freq: Once | ORAL | Status: AC
  Administered 2014-09-24: 40 meq via ORAL
  Filled 2014-09-24: qty 2

## 2014-09-24 MED ORDER — ALBUTEROL SULFATE (2.5 MG/3ML) 0.083% IN NEBU: 2.5000 mg | INHALATION_SOLUTION | RESPIRATORY_TRACT | Status: DC | PRN

## 2014-09-24 MED ORDER — FUROSEMIDE 10 MG/ML IJ SOLN
40.0000 mg | Freq: Four times a day (QID) | INTRAMUSCULAR | Status: AC
  Administered 2014-09-24 – 2014-09-25 (×3): 40 mg via INTRAVENOUS
  Filled 2014-09-24 (×3): qty 4

## 2014-09-24 MED ORDER — HEPARIN SODIUM (PORCINE) 5000 UNIT/ML IJ SOLN
5000.0000 [IU] | Freq: Three times a day (TID) | INTRAMUSCULAR | Status: DC
  Administered 2014-09-24 – 2014-09-26 (×6): 5000 [IU] via SUBCUTANEOUS
  Filled 2014-09-24 (×8): qty 1

## 2014-09-24 MED ORDER — ARFORMOTEROL TARTRATE 15 MCG/2ML IN NEBU
15.0000 ug | INHALATION_SOLUTION | Freq: Two times a day (BID) | RESPIRATORY_TRACT | Status: DC
  Administered 2014-09-24 – 2014-09-27 (×4): 15 ug via RESPIRATORY_TRACT
  Filled 2014-09-24 (×9): qty 2

## 2014-09-24 MED ORDER — METHYLPREDNISOLONE SODIUM SUCC 40 MG IJ SOLR
40.0000 mg | Freq: Four times a day (QID) | INTRAMUSCULAR | Status: DC
  Administered 2014-09-24: 40 mg via INTRAVENOUS
  Filled 2014-09-24 (×4): qty 1

## 2014-09-24 MED ORDER — MAGNESIUM OXIDE 400 (241.3 MG) MG PO TABS
400.0000 mg | ORAL_TABLET | Freq: Once | ORAL | Status: AC
  Administered 2014-09-24: 400 mg via ORAL
  Filled 2014-09-24: qty 1

## 2014-09-24 MED ORDER — BUDESONIDE 0.5 MG/2ML IN SUSP
0.5000 mg | Freq: Two times a day (BID) | RESPIRATORY_TRACT | Status: DC
  Administered 2014-09-24 – 2014-09-27 (×5): 0.5 mg via RESPIRATORY_TRACT
  Filled 2014-09-24 (×8): qty 2

## 2014-09-24 MED ORDER — PANTOPRAZOLE SODIUM 40 MG PO TBEC
40.0000 mg | DELAYED_RELEASE_TABLET | Freq: Every day | ORAL | Status: DC
  Administered 2014-09-25 – 2014-09-27 (×3): 40 mg via ORAL
  Filled 2014-09-24 (×4): qty 1

## 2014-09-24 MED ORDER — ONDANSETRON HCL 4 MG/2ML IJ SOLN: 4.0000 mg | Freq: Four times a day (QID) | INTRAMUSCULAR | Status: DC | PRN

## 2014-09-24 NOTE — Patient Instructions (Signed)
You will be admitted to [facility] ICU via ED

## 2014-09-24 NOTE — H&P (Signed)
PULMONARY / CRITICAL CARE MEDICINE   Name: [name] MRN: [id number] DOB: 01-04-1956    ADMISSION DATE:  09/24/2014  REFERRING MD :  Joya Gaskins  CHIEF COMPLAINT:  SOB/Hypoxia  INITIAL PRESENTATION:  [name] is a 59 yo female with a PMH of COPD (Gold D), pulmonary HTN, CHF, CAD (s/p DES to LAD and chronic total occulsion of RCA 2010). She was in office on 6/22 seeing Dr. Joya Gaskins experiencing respiratory distress  W/ O2 sats in 60s, and was transported via EMS to our intensive care unit. Of note she has experience a 10 pound weight gain since a lasix dose change recently.   STUDIES:    SIGNIFICANT EVENTS: 6/22: Direct admit to ICU from Dr. Bettina Gavia office.    HISTORY OF PRESENT ILLNESS: [name] is a 59 yo female with a PMH of COPD Gold D, pulmonary HTN, CHF, CAD (S/p DES to LAD and chronic total occulusion of RCA, 2010), hyperlipidemia and GERD. Myocardial imaging with SPECT (rest and pharmacologic stress) shows EF of 56mHg and low risk stress test findings. She uses 4L [place] at basline. She presents to our intensive care unit 6/22 after experiencing acute respiratory distress w/ sats in 60s on 4 liters in Dr. WBettina Gaviaoffice via EMS. Significantly she has experienced a 10 pound weight gain since her lasix dose was lowered. [facility] from 5/30-6/2.   PAST MEDICAL HISTORY :   has a past medical history of Coronary artery disease; Pulmonary hypertension; Diastolic dysfunction; COPD (chronic obstructive pulmonary disease); Pulmonary nodule, right; Prediabetes (12/2008); MRSA infection; Tobacco abuse; Obesity; Hyperlipidemia; GERD (gastroesophageal reflux disease); CHF (congestive heart failure); and Shortness of breath dyspnea.  has past surgical history that includes Total abdominal hysterectomy w/ bilateral salpingoophorectomy; Nissen fundoplication; and right heart catheterization (N/A,  11/09/2012). Prior to Admission medications   Medication Sig Start Date End Date Taking? Authorizing Provider  albuterol (PROVENTIL) (2.5 MG/3ML) 0.083% nebulizer solution Take 3 mLs (2.5 mg total) by nebulization every 6 (six) hours as needed for shortness of breath. 09/05/14   LOval Linsey MD  aspirin 81 MG chewable tablet Chew 1 tablet (81 mg total) by mouth daily. Patient taking differently: Chew 81 mg by mouth at bedtime.  05/27/14   DNorman Herrlich MD  Aspirin-Salicylamide-Caffeine (BC HEADACHE PO) Take 1 Package by mouth daily as needed (pain).    Historical Provider, MD  Fluticasone-Salmeterol (ADVAIR DISKUS) 250-50 MCG/DOSE AEPB Inhale 1 puff into the lungs 2 (two) times daily. 09/05/14   TDellia Nims MD  furosemide (LASIX) 40 MG tablet Take 1 tablet (40 mg total) by mouth daily. 09/11/14   ECorky Sox MD  lovastatin (MEVACOR) 40 MG tablet Take 1 tablet (40 mg total) by mouth at bedtime. 09/11/14 09/11/15  ECorky Sox MD  pantoprazole (PROTONIX) 40 MG tablet Take 1 tablet (40 mg total) by mouth daily. 09/05/14   Tasrif Ahmed, MD  tiotropium (SPIRIVA HANDIHALER) 18 MCG inhalation capsule Place 1 capsule (18 mcg total) into inhaler and inhale daily. 09/05/14   Tasrif Ahmed, MD  VENTOLIN HFA 108 (90 BASE) MCG/ACT inhaler Inhale 2 puffs into the lungs every 6 (six) hours as needed. 09/05/14   Historical Provider, MD   Allergies  Allergen Reactions  . Fluconazole Anaphylaxis, Itching and Swelling  . Atorvastatin Other (See Comments)    Dizziness after med started and resolved per pt after med stopped. September 08 2014    FAMILY  HISTORY:  indicated that her mother is deceased. She indicated that her father is deceased.  SOCIAL HISTORY:  reports that she has been smoking Cigarettes.  She started smoking about 45 years ago. She has been smoking about 1.00 pack per day. She has never used smokeless tobacco. She reports that she does not drink alcohol or use illicit drugs.  REVIEW OF SYSTEMS:   Constitutional: Positive for fatigue, denies weight loss, fever, chills. Reports possible sick contacts with roommates grandchildren. Neuro: Positive for intermittent HA, negative for dysarthria, weakness HEENT: Denies blurred vision, rhinorhhea, sore throat. Pulmonary: Positive for productive cough with thick white sputum and SOB. Cardio: Denies Chest pain, chest pressure, jaw or shoulder pain, denies palpitations, syncope, presyncope. GI: Denis n/v/d, constipation. GU: denies urinary changes,dysuria, polyuria. Endocrine: denies heat or cool intolerance.   SUBJECTIVE: feels SOB VITAL SIGNS: Temp:  [98.7 F (37.1 C)-99.1 F (37.3 C)] 99.1 F (37.3 C) (06/22 1301) Pulse Rate:  [84-94] 88 (06/22 1400) Resp:  [20-27] 20 (06/22 1400) BP: (130-139)/(61-82) 139/77 mmHg (06/22 1400) SpO2:  [70 %-99 %] 98 % (06/22 1400) Weight:  [118.661 kg (261 lb 9.6 oz)] 118.661 kg (261 lb 9.6 oz) (06/22 1100) HEMODYNAMICS:   VENTILATOR SETTINGS:   INTAKE / OUTPUT:  Intake/Output Summary (Last 24 hours) at 09/24/14 1528 Last data filed at 09/24/14 1455  Gross per 24 hour  Intake      0 ml  Output   1450 ml  Net  -1450 ml    PHYSICAL EXAMINATION: General: lying in bed, slight SOB with nonrebreather mask Neuro:  Alert and oriented. CN II-XII grossly intact. HEENT: 2m PERRL Cardiovascular:  Regular rate and rhythm. No rubs, gallops, or murmurs Lungs:  Rales prominent in bases and posterior lung fields bilaterally Abdomen: obese, non tender, nondistened Musculoskeletal:  intact Skin:  Warm, dry  LABS:  CBC  Recent Labs Lab 09/24/14 1300  WBC 7.4  HGB 13.5  HCT 42.8  PLT 149*   Coag's  Recent Labs Lab 09/24/14 1300  INR 1.05   BMET  Recent Labs Lab 09/24/14 1300  NA 143  K 3.2*  CL 93*  CO2 44*  BUN <5*  CREATININE 0.77  GLUCOSE 133*   Electrolytes  Recent Labs Lab 09/24/14 1300  CALCIUM 8.6*  MG 1.8  PHOS 3.0   Sepsis Markers  Recent Labs Lab 09/24/14 1300   LATICACIDVEN 1.2  PROCALCITON <0.10   ABG No results for input(s): PHART, PCO2ART, PO2ART in the last 168 hours. Liver Enzymes  Recent Labs Lab 09/24/14 1300  AST 12*  ALT 8*  ALKPHOS 96  BILITOT 0.5  ALBUMIN 2.9*   Cardiac Enzymes  Recent Labs Lab 09/24/14 1300  TROPONINI 0.03   Glucose  Recent Labs Lab 09/24/14 1228  GLUCAP 144*    Imaging No results found.   ASSESSMENT / PLAN:  PULMONARY OETT A: Acute on chronic hypoxic respiratory failure in setting of what is likely decompensated diastolic HF w/ pulmonary edema superimposed on underlying GOLD D COPD.  Hx she gives is more c/w decomp HF than typical COPD exacerbation.  P:   -CXR now and in AM -Maintain Sats >88% -IS q 1 hr while awake -diuresis as BP,BUN and cr tolerated   CARDIOVASCULAR CVL A:  Acute on chronic decompensated diastolic HF  R> L LE edema  Hyperlipidemia P:  -Diuresis -Tele -EKG -Cycle CEs x 3  RENAL A:   Hypokalemia Metabolic Alkalosis Clinically Volume overload P:   -Give magox 4023m-  Replace potassium -Diuresis -Strict I&O  GASTROINTESTINAL A:   No acute problems P:   NPO for now  HEMATOLOGIC A:  Slight thrombocytopenia Positive DDimer w/ asymmetric LE edema  P:  -Monitor on CBC -LE Doppler  INFECTIOUS A:  No acute process P:   -Trend fever curve -Urine legionella and strep antigens -Monitor WBC   ENDOCRINE A:   Mild Hyperglycemia- likely steroid induced P:  - trend glucose; ssi as indicated   NEUROLOGIC A:  No acute process P:   -Q 4hr assessments RASS goal: 0   FAMILY  - Updates:   - Inter-disciplinary family meet or Palliative Care meeting due by:      TODAY'S SUMMARY: likely acute on chronic decomp HF w/ volume overload. Not what one would consider typical AECOPD. Therefore will focus on diuresis and supportive care. Will hold off on escalated rx for airway disease at this time.   Erick Colace ACNP-BC [place] Pager # [phone] OR # [phone] if no answer   STAFF NOTE: I, Merrie Roof, MD FACP have personally reviewed patient's available data, including medical history, events of note, physical examination and test results as part of my evaluation. I have discussed with resident/NP and other care providers such as pharmacist, RN and RRT. In addition, I personally evaluated patient and elicited key findings of: No distress, no sig wheezing, no NIMV needed at this stage, dc roids, continued copd regimen, classic presentation for pulm edema, chf, has known stent, r/o ischemia, trop, ecg, lasix, repalce K, mag prior to diuresis, goal net neg 1,5 liters, chem in am, consider doppler as unilateral swelling, in icu, likley to go to floor in am , tele   Lavon Paganini. Titus Mould, MD, [place] Pgr: [facility] 09/24/2014 4:04 PM

## 2014-09-24 NOTE — Telephone Encounter (Signed)
Pt now has scheduled appointment with PCP for 9/9.

## 2014-09-24 NOTE — H&P (Signed)
  PCCM   58 y.o.F with copd Gold D and CHF. Pt with acute distress in the office .  Sats 60% on 4L  Pt with 10# weight gain over 2 weeks with lowered lasix dose  Plan is admit directly to ICU via EMS Bed available and confirmed on 53M ICU providers available for acceptance.  Full H and P will follow.  Mariel Sleet Beeper  [phone]  Cell  [phone]  If no response or cell goes to voicemail, call beeper [phone]

## 2014-09-24 NOTE — Assessment & Plan Note (Signed)
Acute on chronic CHF 10# weight gain Copd exac Ongoing tobacco use  Acute resp failure, sats 60% on 4L Plan  ADM direct to icu  See h and p

## 2014-09-24 NOTE — Progress Notes (Signed)
Subjective:    Patient ID: [name], female    DOB: 1956/02/07, 59 y.o.   MRN: [id number]  HPI 09/24/2014 Chief Complaint  Patient presents with  . Pulmonary Consult    Former pt - last seen 2010.  Was [facility]'d from [facility] approx 2 wks ago.  o2 sat 70% on 5lpm pulsed upon arrival to exam room.  Fatigued yesterday.  Increased SOB and chest tightness this morning. Cough with white mucus.  No f/c/s.    Markedly worse Not seen in pulm since 2010 Severe dyspnea, sats 60% on arrival on 4L    Current Medications, Allergies, Complete Past Medical History, Past Surgical History, Family History, and Social History were reviewed in [place] record per todays encounter:  09/24/2014  Review of Systems  Constitutional: Positive for diaphoresis, fatigue and unexpected weight change.  HENT: Negative.  Negative for ear pain, postnasal drip, rhinorrhea, sinus pressure, sore throat, trouble swallowing and voice change.   Eyes: Negative.   Respiratory: Positive for cough, choking, chest tightness, shortness of breath and wheezing. Negative for apnea and stridor.   Cardiovascular: Positive for chest pain and leg swelling. Negative for palpitations.  Gastrointestinal: Negative.  Negative for nausea, vomiting, abdominal pain and abdominal distention.  Genitourinary: Negative.   Musculoskeletal: Negative.  Negative for myalgias and arthralgias.  Skin: Negative.  Negative for rash.  Allergic/Immunologic: Negative.  Negative for environmental allergies and food allergies.  Neurological: Negative.  Negative for dizziness, syncope, weakness and headaches.  Hematological: Negative.  Negative for adenopathy. Does not bruise/bleed easily.  Psychiatric/Behavioral: Negative.  Negative for sleep disturbance and agitation. The patient is not nervous/anxious.        Objective:   Physical Exam  Filed Vitals:   09/24/14 1052 09/24/14 1059 09/24/14 1100  BP:   130/74  Pulse:   94  Temp:    98.7 F (37.1 C)  TempSrc:   Oral  Height:   5' 4.5" (1.638 m)  Weight:   261 lb 9.6 oz (118.661 kg)  SpO2: 70% 88% 94%    Gen: Pleasant, well-nourished, in no distress,  normal affect  ENT: No lesions,  mouth clear,  oropharynx clear, no postnasal drip  Neck: No JVD, no TMG, no carotid bruits  Lungs: No use of accessory muscles, no dullness to percussion, rales , poor airflow  Cardiovascular: RRR, heart sounds normal, no murmur or gallops, no peripheral edema  Abdomen: soft and NT, no HSM,  BS normal  Musculoskeletal: No deformities, no cyanosis or clubbing  Neuro: alert, non focal  Skin: Warm, no lesions or rashes  No results found.       Assessment & Plan:  I personally reviewed all images and lab data in the [facility] system as well as any outside material available during this office visit and agree with the  radiology impressions.   Acute on chronic diastolic CHF (congestive heart failure) Acute on chronic CHF 10# weight gain Copd exac Ongoing tobacco use  Acute resp failure, sats 60% on 4L Plan  ADM direct to icu  See h and p

## 2014-09-24 NOTE — Progress Notes (Signed)
Pt has not been on Bipap since admitted to hospital. Bipap is not needed at this time. Pt states she is feeling better since she arrived. RT will continue to monitor.

## 2014-09-25 ENCOUNTER — Inpatient Hospital Stay (HOSPITAL_COMMUNITY): Payer: Commercial Managed Care - HMO

## 2014-09-25 ENCOUNTER — Encounter (HOSPITAL_COMMUNITY): Payer: Self-pay | Admitting: *Deleted

## 2014-09-25 DIAGNOSIS — I5020 Unspecified systolic (congestive) heart failure: Secondary | ICD-10-CM

## 2014-09-25 DIAGNOSIS — I509 Heart failure, unspecified: Secondary | ICD-10-CM | POA: Insufficient documentation

## 2014-09-25 DIAGNOSIS — J9622 Acute and chronic respiratory failure with hypercapnia: Secondary | ICD-10-CM

## 2014-09-25 DIAGNOSIS — G459 Transient cerebral ischemic attack, unspecified: Secondary | ICD-10-CM

## 2014-09-25 LAB — BASIC METABOLIC PANEL
ANION GAP: 7 (ref 5–15)
BUN: 6 mg/dL (ref 6–20)
BUN: 9 mg/dL (ref 6–20)
CALCIUM: 8.5 mg/dL — AB (ref 8.9–10.3)
CHLORIDE: 83 mmol/L — AB (ref 101–111)
CO2: 47 mmol/L — ABNORMAL HIGH (ref 22–32)
CO2: >50 mmol/L — ABNORMAL HIGH (ref 22–32)
Calcium: 8.4 mg/dL — ABNORMAL LOW (ref 8.9–10.3)
Chloride: 89 mmol/L — ABNORMAL LOW (ref 101–111)
Creatinine, Ser: 0.84 mg/dL (ref 0.44–1.00)
Creatinine, Ser: 0.98 mg/dL (ref 0.44–1.00)
GFR calc Af Amer: >60 mL/min (ref >60)
GFR calc Af Amer: >60 mL/min (ref >60)
GFR calc non Af Amer: >60 mL/min (ref >60)
GFR calc non Af Amer: >60 mL/min (ref >60)
GLUCOSE: 150 mg/dL — AB (ref 65–99)
Glucose, Bld: 194 mg/dL — ABNORMAL HIGH (ref 65–99)
Potassium: 3.4 mmol/L — ABNORMAL LOW (ref 3.5–5.1)
Potassium: 4.6 mmol/L (ref 3.5–5.1)
SODIUM: 141 mmol/L (ref 135–145)
SODIUM: 143 mmol/L (ref 135–145)

## 2014-09-25 LAB — CBC
HCT: 46.1 % — ABNORMAL HIGH (ref 36.0–46.0)
Hemoglobin: 14.1 g/dL (ref 12.0–15.0)
MCH: 31.1 pg (ref 26.0–34.0)
MCHC: 30.6 g/dL (ref 30.0–36.0)
MCV: 101.8 fL — AB (ref 78.0–100.0)
Platelets: 154 10*3/uL (ref 150–400)
RBC: 4.53 MIL/uL (ref 3.87–5.11)
RDW: 14.1 % (ref 11.5–15.5)
WBC: 6.7 10*3/uL (ref 4.0–10.5)

## 2014-09-25 LAB — LEGIONELLA ANTIGEN, URINE

## 2014-09-25 LAB — TROPONIN I: Troponin I: <0.03 ng/mL (ref <0.031)

## 2014-09-25 MED ORDER — TIOTROPIUM BROMIDE MONOHYDRATE 18 MCG IN CAPS
18.0000 ug | ORAL_CAPSULE | Freq: Every day | RESPIRATORY_TRACT | Status: DC
  Administered 2014-09-26 – 2014-09-27 (×2): 18 ug via RESPIRATORY_TRACT
  Filled 2014-09-25: qty 5

## 2014-09-25 MED ORDER — CETYLPYRIDINIUM CHLORIDE 0.05 % MT LIQD
7.0000 mL | Freq: Two times a day (BID) | OROMUCOSAL | Status: DC
  Administered 2014-09-25 – 2014-09-27 (×4): 7 mL via OROMUCOSAL

## 2014-09-25 MED ORDER — PRAVASTATIN SODIUM 40 MG PO TABS
40.0000 mg | ORAL_TABLET | Freq: Every day | ORAL | Status: DC
Start: 2014-09-25 — End: 2014-09-27
  Administered 2014-09-25 – 2014-09-26 (×2): 40 mg via ORAL
  Filled 2014-09-25 (×4): qty 1

## 2014-09-25 MED ORDER — FUROSEMIDE 10 MG/ML IJ SOLN
40.0000 mg | Freq: Two times a day (BID) | INTRAMUSCULAR | Status: AC
  Administered 2014-09-25 – 2014-09-26 (×3): 40 mg via INTRAVENOUS
  Filled 2014-09-25 (×5): qty 4

## 2014-09-25 NOTE — Progress Notes (Signed)
VASCULAR LAB PRELIMINARY  PRELIMINARY  PRELIMINARY  PRELIMINARY  Bilateral lower extremity venous duplex  completed.    Preliminary report:  Bilateral:  No evidence of DVT, superficial thrombosis, or Baker's Cyst.    Tinsleigh Slovacek, RVT 09/25/2014, 10:17 AM

## 2014-09-25 NOTE — Progress Notes (Signed)
Attempted to call report to [facility]. Nurse not available. Will call back in 10 minutes.

## 2014-09-25 NOTE — Consult Note (Signed)
   [facility] CM Inpatient Consult   09/25/2014  [name] 10-29-1955 [id number] Patient is currently active with [place] Management for community resources with LCSW.  Patient has been engaged by a LCSW.  Patient had declined disease management services in the past but wanted assistance with transportation issues with [facility]. This Probation officer will follow for needs.  Made Inpatient Case Manager aware that [place] Management following. Of note, [facility] Care Management services does not replace or interfere with any services that are arranged by inpatient case management or social work.  For additional questions or referrals please contact: Natividad Brood, RN BSN [facility] Liaison  [phone] business mobile phone

## 2014-09-25 NOTE — Care Management Note (Signed)
Case Management Note  Patient Details  Name: [name] MRN: [id number] Date of Birth: Jul 12, 1955  Subjective/Objective:   Patient awake and alert, now on [place], but desats easily.  Frustrated with her situation.  Wants to know what she could do to stay out of the hospital, but states she is 59 yo and does not need someone to come and tell her what to do at home.  Shares a house with a roommate who has a disabled husband.  Her grandson and dog live with her and her son comes for dinner every night.  She states she can only buy what she can afford to feed everybody.  She states her house is a mess and admits that she really doesn't want anyone to come to her house because it is such a mess.  Agreeable to one visit from [facility] RN to see what they could offer her.  I feel that she would benefit from a SW also so assist with community programs and possible assistance with getting Medicaid.  Patient has been hospitalized every month for the last 6 months, deemed high risk readmission.  Insurance in net work with [facility], not [place] so referral made to them.  Will need orders on discharge for RN and SW.                 Action/Plan:   Expected Discharge Date:                  Expected Discharge Plan:  [place]  In-House Referral:     Discharge planning Services     Post Acute Care Choice:    Choice offered to:     DME Arranged:    DME Agency:  [place]:    [facility] Agency:     Status of Service:  In process, will continue to follow  Medicare Important Message Given:    Date Medicare IM Given:    Medicare IM give by:    Date Additional Medicare IM Given:    Additional Medicare Important Message give by:     If discussed at [place] of Stay Meetings, dates discussed:    Additional Comments:  Vergie Living, RN 09/25/2014, 11:22 AM

## 2014-09-25 NOTE — Progress Notes (Signed)
PULMONARY / CRITICAL CARE MEDICINE   Name: [name] MRN: [id number] DOB: [birth year]    ADMISSION DATE:  09/24/2014  REFERRING MD :  Joya Gaskins  CHIEF COMPLAINT:  SOB/Hypoxia  INITIAL PRESENTATION:  [name] is a 59 yo female with a PMH of COPD (Gold D), pulmonary HTN, CHF, CAD (s/p DES to LAD and chronic total occulsion of RCA 2010). She was in office on 6/22 seeing Dr. Joya Gaskins experiencing respiratory distress  W/ O2 sats in 60s, and was transported via EMS to our intensive care unit. Of note she has experience a 10 pound weight gain since a lasix dose change recently.   STUDIES:    SIGNIFICANT EVENTS: 6/22: Direct admit to ICU from Dr. Bettina Gavia office.  6/23- lasix improved after neg balance  SUBJECTIVE: improved sob  VITAL SIGNS: Temp:  [98.5 F (36.9 C)-99.3 F (37.4 C)] 98.5 F (36.9 C) (06/23 0334) Pulse Rate:  [73-98] 74 (06/23 0600) Resp:  [17-27] 21 (06/23 0600) BP: (91-141)/(51-93) 111/62 mmHg (06/23 0600) SpO2:  [70 %-99 %] 96 % (06/23 0600) Weight:  [115.5 kg (254 lb 10.1 oz)-118.661 kg (261 lb 9.6 oz)] 115.5 kg (254 lb 10.1 oz) (06/23 0500) HEMODYNAMICS:   VENTILATOR SETTINGS:   INTAKE / OUTPUT:  Intake/Output Summary (Last 24 hours) at 09/25/14 0645 Last data filed at 09/25/14 0522  Gross per 24 hour  Intake      0 ml  Output   4025 ml  Net  -4025 ml    PHYSICAL EXAMINATION: General: lying in bed, no distress Neuro:  Alert and oriented. CN II-XII grossly intact. HEENT: 46m PERRL Cardiovascular:  Regular rate and rhythm. No rubs, gallops, or murmurs Lungs:  Mild residual basilar crackles Abdomen: obese, non tender, nondistened Musculoskeletal:  intact Skin:  Warm, dry     Imaging No results found.   ASSESSMENT / PLAN:  PULMONARY OETT A: Acute on chronic hypoxic respiratory failure in setting of what is likely decompensated diastolic HF w/ pulmonary edema superimposed on underlying GOLD D COPD.  Hx she gives is more c/w decomp HF than  typical COPD exacerbation.  R/o effusion ATX bases Prior lung mass/nodule P:   -CXR now and in AM -Maintain Sats >88% -IS q 1 hr while awake -diuresis further -CT chest pre dc, after diuretics -[place] bases for effusion if no improved fully  CARDIOVASCULAR CVL A:  Acute on chronic decompensated diastolic HF  R/O MI Troponin neg x 3 R> L LE edema  Hyperlipidemia P:  - Continue diuresis, IV lasix -Tele  RENAL A:   Hypokalemia>>>resolved Metabolic Alkalosis Clinically Volume overload P:   -Diuresis, lower -Strict I&O  GASTROINTESTINAL A:   No acute problems P:   Cardiac diet  HEMATOLOGIC A:  Slight thrombocytopenia Positive DDimer w/ asymmetric LE edema  P:  -Monitor on CBC -LE Doppler 6/23 will monitor results -lasix hemonentration noted  INFECTIOUS A:  No acute process Strep pneumo antigen neg, PCT <0.10 P:   -Trend fever curve -Monitor WBC Low suspcion  ENDOCRINE A:   Mild Hyperglycemia- likely steroid induced P:  - trend glucose -AC&HS blood glucose checks -Add SSI  NEUROLOGIC A:  No acute process P:   -Q 4hr assessments RASS goal: 0 Ambulating, with sats   FAMILY  - Updates:   - Inter-disciplinary family meet or Palliative Care meeting due by:      TODAY'S SUMMARY: Better since diuresis with improvement of SOB. Off non rebreather and on 6L [place]. Diuresed 4 liters since  6/22 admission. Decrease lasix frequency. CE negative. Currently being evaluated for LE DVT. She no longer requires ICU and can be transferred to floor.    STAFF NOTE: I, Merrie Roof, MD FACP have personally reviewed patient's available data, including medical history, events of note, physical examination and test results as part of my evaluation. I have discussed with resident/NP and other care providers such as pharmacist, RN and RRT. In addition, I personally evaluated patient and elicited key findings of: no distress, lungs improved examination, some more  diuresis needed, no ischemia noted, will need CT chest follow up after diuresis, to sdu  Lavon Paganini. Titus Mould, MD, [place] Pgr: [place] [facility] 09/25/2014 12:03 PM

## 2014-09-26 ENCOUNTER — Inpatient Hospital Stay (HOSPITAL_COMMUNITY): Payer: Commercial Managed Care - HMO

## 2014-09-26 LAB — BASIC METABOLIC PANEL
BUN: 8 mg/dL (ref 6–20)
CALCIUM: 8.5 mg/dL — AB (ref 8.9–10.3)
CO2: >50 mmol/L — ABNORMAL HIGH (ref 22–32)
Chloride: 82 mmol/L — ABNORMAL LOW (ref 101–111)
Creatinine, Ser: 0.87 mg/dL (ref 0.44–1.00)
GFR calc non Af Amer: >60 mL/min (ref >60)
Glucose, Bld: 137 mg/dL — ABNORMAL HIGH (ref 65–99)
Potassium: 3.6 mmol/L (ref 3.5–5.1)
SODIUM: 140 mmol/L (ref 135–145)

## 2014-09-26 MED ORDER — ENOXAPARIN SODIUM 40 MG/0.4ML ~~LOC~~ SOLN
40.0000 mg | SUBCUTANEOUS | Status: DC
  Administered 2014-09-27: 40 mg via SUBCUTANEOUS
  Filled 2014-09-26: qty 0.4

## 2014-09-26 MED ORDER — POTASSIUM CHLORIDE CRYS ER 20 MEQ PO TBCR
40.0000 meq | EXTENDED_RELEASE_TABLET | Freq: Once | ORAL | Status: AC
  Administered 2014-09-26: 40 meq via ORAL
  Filled 2014-09-26: qty 2

## 2014-09-26 NOTE — Progress Notes (Signed)
Nutrition Brief Note  Patient identified on the Malnutrition Screening Tool (MST) Report.  Wt Readings from Last 15 Encounters:  09/26/14 252 lb 6.8 oz (114.5 kg)  09/24/14 261 lb 9.6 oz (118.661 kg)  09/11/14 256 lb 4.8 oz (116.257 kg)  09/04/14 253 lb 9.6 oz (115.032 kg)  07/30/14 262 lb 9.6 oz (119.115 kg)  07/23/14 261 lb (118.389 kg)  07/15/14 256 lb 9.9 oz (116.4 kg)  07/01/14 248 lb 3.2 oz (112.583 kg)  05/27/14 246 lb 11.1 oz (111.9 kg)  04/12/14 248 lb (112.492 kg)  04/10/14 256 lb 14.4 oz (116.529 kg)  03/20/14 254 lb 3.1 oz (115.3 kg)  02/24/14 251 lb (113.853 kg)  02/10/14 256 lb 1.6 oz (116.166 kg)  02/02/14 250 lb 3.6 oz (113.5 kg)    Body mass index is 42.68 kg/(m^2). Patient meets criteria for Obesity Class III based on current BMI.   Current diet order is Heart Healthy.  Labs and medications reviewed.   No nutrition interventions warranted at this time. If nutrition issues arise, please consult RD.   Arthur Holms, RD, LDN Pager #: [phone] After-Hours Pager #: [phone]

## 2014-09-26 NOTE — Progress Notes (Signed)
No new complaints Hasn't ambulated much since admission No cough or CP  Filed Vitals:   09/26/14 0852 09/26/14 1220 09/26/14 1326 09/26/14 1330  BP: 118/88 105/66 117/75   Pulse: 83 76 78 97  Temp: 98.1 F (36.7 C)  98.2 F (36.8 C)   TempSrc: Axillary  Oral   Resp: _0 Height:      Weight:      SpO2: 93% 93% 91% 91%   4 lpm [place]  NAD JVP not well visualized Diminished BS, no wheezes, faint bibasilar crackles RRR s M Obese, soft, NT, +BS Ext warm without edema  BMET    Component Value Date/Time   NA 140 09/26/2014 0500   K 3.6 09/26/2014 0500   CL 82* 09/26/2014 0500   CO2 >50* 09/26/2014 0500   GLUCOSE 137* 09/26/2014 0500   BUN 8 09/26/2014 0500   CREATININE 0.87 09/26/2014 0500   CREATININE 0.80 09/11/2014 1045   CALCIUM 8.5* 09/26/2014 0500   GFRNONAA >60 09/26/2014 0500   GFRNONAA 80 09/11/2014 1045   GFRAA >60 09/26/2014 0500   GFRAA >89 09/11/2014 1045    CBC    Component Value Date/Time   WBC 6.7 09/25/2014 0027   RBC 4.53 09/25/2014 0027   HGB 14.1 09/25/2014 0027   HCT 46.1* 09/25/2014 0027   PLT 154 09/25/2014 0027   MCV 101.8* 09/25/2014 0027   MCH 31.1 09/25/2014 0027   MCHC 30.6 09/25/2014 0027   RDW 14.1 09/25/2014 0027   LYMPHSABS 0.9 09/24/2014 1300   MONOABS 0.6 09/24/2014 1300   EOSABS 0.2 09/24/2014 1300   BASOSABS 0.0 09/24/2014 1300    No new CXR  IMPRESSION: Acute on chronic hypoxemic resp failure - baseline 4 lpm [place] COPD CHF Pulm edema - clinically resolving Still smoking  PLAN/REC: Cont current med Rx [facility] Counseled re: smoking cessation Recheck 2v CXR today Anticipate DC home in next day or two  Transfer to med-surg  Merton Border, MD ; [facility] service Mobile [phone].  After 5:30 PM or weekends, call [phone]

## 2014-09-26 NOTE — Progress Notes (Signed)
Pt being transferred to [place]. Report given to [place], [place]

## 2014-09-27 LAB — BASIC METABOLIC PANEL
ANION GAP: 10 (ref 5–15)
BUN: 8 mg/dL (ref 6–20)
CHLORIDE: 85 mmol/L — AB (ref 101–111)
CO2: 45 mmol/L — ABNORMAL HIGH (ref 22–32)
Calcium: 9.2 mg/dL (ref 8.9–10.3)
Creatinine, Ser: 0.53 mg/dL (ref 0.44–1.00)
GFR calc Af Amer: >60 mL/min (ref >60)
GFR calc non Af Amer: >60 mL/min (ref >60)
GLUCOSE: 145 mg/dL — AB (ref 65–99)
POTASSIUM: 4.4 mmol/L (ref 3.5–5.1)
SODIUM: 140 mmol/L (ref 135–145)

## 2014-09-27 MED ORDER — FUROSEMIDE 40 MG PO TABS: 40.0000 mg | ORAL_TABLET | Freq: Two times a day (BID) | ORAL | Status: DC

## 2014-09-27 MED ORDER — FUROSEMIDE 10 MG/ML IJ SOLN: 40.0000 mg | Freq: Two times a day (BID) | INTRAMUSCULAR | Status: DC

## 2014-09-27 NOTE — Discharge Summary (Signed)
Physician Discharge Summary       Patient ID: [name] MRN: [id number] DOB/AGE: 60-Jun-1957 60 y.o.  Admit date: 09/24/2014 Discharge date: 09/27/2014  Discharge Diagnoses:   Acute on chronic hypoxic respiratory failure decompensated diastolic HF  pulmonary edema  GOLD D COPD.  Pulmonary Nodule- 3 mm LLL nodule  Tobacco abuse  Acute on chronic decompensated diastolic HF  Hypokalemia  Metabolic alkalosis  Thrombocytopenia  Hyperglycemia   Detailed Hospital Course:  [name] is a 59 yo female with a PMH of COPD Gold D, pulmonary HTN, CHF, CAD (S/p DES to LAD and chronic total occulusion of RCA, 2010), hyperlipidemia and GERD. Myocardial imaging with SPECT (rest and pharmacologic stress) shows EF of 33mHg and low risk stress test findings. She uses 4L [place] at basline. She presents to our intensive care unit 6/22 after experiencing acute respiratory distress w/ sats in 60s on 4 liters in Dr. WBettina Gaviaoffice via EMS. Significantly she has experienced a 10 pound weight gain since her lasix dose was lowered. [facility]30-6/2.  She was initially admitted to the intensive care. Therapeutic interventions included: oxygen, lasix and supportive BDs. She had significant improvement with these interventions and we were able to transfer her out of the intensive care the following day. We checked cardiac enzymes, these were negative, we also had lower extremity dopplers these were negative. She had made persistent improvements and was ready for discharge as of 6/25 with the following plan of care.     Discharge Plan by active problems   Acute on chronic hypoxic respiratory failure in setting of what is likely decompensated diastolic HF w/ pulmonary edema superimposed on underlying GOLD D COPD.  Plan:  -diuresis further, with instruction not to skip doses.  -Keep f/u appointment with  cardiology - cont bid lasix  Pulmonary Nodule- 3 mm LLL nodule recently found. High risk for CA. She was seeing Dr WJoya Gaskinsto have this assessed, when she had to be admitted for fluid overload. She wants to go home now and have this worked up as outpatient. Tobacco abuse Plan F/u our clinic  Significant Hospital tests/ studies  Consults   Discharge Exam: BP 119/51 mmHg  Pulse 74  Temp(Src) 98.1 F (36.7 C) (Oral)  Resp 21  Ht 5' 4.5" (1.638 m)  Wt 113.4 kg (250 lb)  BMI 42.27 kg/m2  SpO2 91%  4 liters  General: lying in bed, no distress Neuro: Alert and oriented. HEENT: PERRL Cardiovascular: Regular rate and rhythm. No rubs, gallops, or murmurs Lungs: Clear with no rales/ crackles heard Abdomen: obese, non tender, nondistened Musculoskeletal: Intact. Too heavy to sit up, but she rolls side to side. Skin: Warm, dry  Labs at discharge Lab Results  Component Value Date   CREATININE 0.53 09/27/2014   BUN 8 09/27/2014   NA 140 09/27/2014   K 4.4 09/27/2014   CL 85* 09/27/2014   CO2 45* 09/27/2014   Lab Results  Component Value Date   WBC 6.7 09/25/2014   HGB 14.1 09/25/2014   HCT 46.1* 09/25/2014   MCV 101.8* 09/25/2014   PLT 154 09/25/2014   Lab Results  Component Value Date   ALT 8* 09/24/2014   AST 12* 09/24/2014   ALKPHOS 96 09/24/2014   BILITOT 0.5 09/24/2014   Lab Results  Component Value Date   INR 1.05 09/24/2014   INR 0.92 11/09/2012   INR 1.06 09/07/2010  Current radiology studies Dg Chest 2 View  09/26/2014   CLINICAL DATA:  Congestive heart failure. Coronary artery disease. Pulmonary nodule.  EXAM: CHEST  2 VIEW  COMPARISON:  Multiple exams, including 09/25/2014 and 09/02/2014  FINDINGS: Although there is continued interstitial opacity and indistinctness of the pulmonary vasculature, this appears mildly improved from yesterday's exam.  The patient is rotated to the right on today's radiograph, reducing diagnostic sensitivity and  specificity. Persistent indistinct rounded density in the left lower lobe representing the large pulmonary nodule, concerning for possible malignancy. There is some continued ill-defined density at the right lung base probably from atelectasis/scarring given the appearance on prior chest CT.  IMPRESSION: 1. Improved but not completely resolved interstitial edema. 2. Large left lower lobe pulmonary nodule, nearly 3 cm diameter. 3. Suspected scarring or atelectasis at the right lung base.   Electronically Signed   By: Van Clines M.D.   On: 09/26/2014 16:00    Disposition:  01-Home or Self Care      Discharge Instructions    Diet - low sodium heart healthy    Complete by:  As directed      Discharge instructions    Complete by:  As directed   Check weight daily     Increase activity slowly    Complete by:  As directed             Medication List    TAKE these medications        albuterol (2.5 MG/3ML) 0.083% nebulizer solution  Commonly known as:  PROVENTIL  Take 3 mLs (2.5 mg total) by nebulization every 6 (six) hours as needed for shortness of breath.     VENTOLIN HFA 108 (90 BASE) MCG/ACT inhaler  Generic drug:  albuterol  Inhale 2 puffs into the lungs every 6 (six) hours as needed.     aspirin 81 MG chewable tablet  Chew 1 tablet (81 mg total) by mouth daily.     BC HEADACHE PO  Take 1 Package by mouth daily as needed (pain).     Fluticasone-Salmeterol 250-50 MCG/DOSE Aepb  Commonly known as:  ADVAIR DISKUS  Inhale 1 puff into the lungs 2 (two) times daily.     furosemide 40 MG tablet  Commonly known as:  LASIX  Take 1 tablet (40 mg total) by mouth 2 (two) times daily.     lovastatin 40 MG tablet  Commonly known as:  MEVACOR  Take 1 tablet (40 mg total) by mouth at bedtime.     pantoprazole 40 MG tablet  Commonly known as:  PROTONIX  Take 1 tablet (40 mg total) by mouth daily.     tiotropium 18 MCG inhalation capsule  Commonly known as:  SPIRIVA  HANDIHALER  Place 1 capsule (18 mcg total) into inhaler and inhale daily.       Follow-up Information    Schedule an appointment as soon as possible for a visit with Asencion Noble, MD.   Specialty:  Pulmonary Disease   Why:  4-6 weeks    Contact information:   [place] [address] [phone]       Follow up with Minus Breeding, MD.   Specialty:  Cardiology   Why:  keep your appointment on the 22nd    Contact information:   [place] [address] [phone]       Follow up with Luanne Bras, MD In 1 week.   Specialty:  Internal Medicine  Why:  call    Contact information:   [place] [address] [phone]       Discharged Condition: good  Physician Statement:   The Patient was personally examined, the discharge assessment and plan has been personally reviewed and I agree with ACNP Babcock's assessment and plan. > 30 minutes of time have been dedicated to discharge assessment, planning and discharge instructions.   Signed: BABCOCK,PETE 09/27/2014, 4:48 PM   I examined this patient, reviewed and agree with plans CD Annamaria Boots, MD PCCM

## 2014-09-27 NOTE — Progress Notes (Signed)
PULMONARY / CRITICAL CARE MEDICINE   Name: [name] MRN: [id number] DOB: [birth year]    ADMISSION DATE:  09/24/2014  REFERRING MD :  Joya Gaskins  CHIEF COMPLAINT:  SOB/Hypoxia  INITIAL PRESENTATION:  [name] is a 59 yo female smoker with a PMH of COPD (Gold D), pulmonary HTN, CHF, CAD (s/p DES to LAD and chronic total occulsion of RCA 2010). She was in office on 6/22 seeing Dr. Joya Gaskins experiencing respiratory distress  W/ O2 sats in 60s, and was transported via EMS to our intensive care unit. Of note she has experience a 10 pound weight gain since a lasix dose change recently.   STUDIES:    SIGNIFICANT EVENTS: 6/22: Direct admit to ICU from Dr. Bettina Gavia office.  6/23- lasix improved after neg balance  SUBJECTIVE: improved sob. She is aware of LLL nodule and prefers outpatient w/u rather than staying for needle bx this admission. She admits acute fluid retention was due to skipping lasix.  VITAL SIGNS: Temp:  [98 F (36.7 C)-99.7 F (37.6 C)] 99.2 F (37.3 C) (06/25 0630) Pulse Rate:  [76-97] 85 (06/25 0521) Resp:  [17-23] 20 (06/25 0521) BP: (100-123)/(54-88) 114/63 mmHg (06/25 0521) SpO2:  [91 %-97 %] 92 % (06/25 0521) Weight:  [113.4 kg (250 lb)] 113.4 kg (250 lb) (06/24 2131) HEMODYNAMICS:   VENTILATOR SETTINGS:   INTAKE / OUTPUT:  Intake/Output Summary (Last 24 hours) at 09/27/14 0804 Last data filed at 09/27/14 0612  Gross per 24 hour  Intake      0 ml  Output   1700 ml  Net  -1700 ml     PHYSICAL EXAMINATION: General: lying in bed, no distress Neuro:  Alert and oriented. CN II-XII grossly intact. HEENT: 25m PERRL Cardiovascular:  Regular rate and rhythm. No rubs, gallops, or murmurs Lungs:  Clear with no rales/ crackles heard Abdomen: obese, non tender, nondistened Musculoskeletal:  Intact. Too heavy to sit up, but she rolls side to side. Skin:  Warm, dry   Imaging Dg Chest 2 View  09/26/2014   CLINICAL DATA:  Congestive heart failure. Coronary  artery disease. Pulmonary nodule.  EXAM: CHEST  2 VIEW  COMPARISON:  Multiple exams, including 09/25/2014 and 09/02/2014  FINDINGS: Although there is continued interstitial opacity and indistinctness of the pulmonary vasculature, this appears mildly improved from yesterday's exam.  The patient is rotated to the right on today's radiograph, reducing diagnostic sensitivity and specificity. Persistent indistinct rounded density in the left lower lobe representing the large pulmonary nodule, concerning for possible malignancy. There is some continued ill-defined density at the right lung base probably from atelectasis/scarring given the appearance on prior chest CT.  IMPRESSION: 1. Improved but not completely resolved interstitial edema. 2. Large left lower lobe pulmonary nodule, nearly 3 cm diameter. 3. Suspected scarring or atelectasis at the right lung base.   Electronically Signed   By: WVan ClinesM.D.   On: 09/26/2014 16:00   CXR- 09/26/14 images reviewed by me. IMPRESSION: 1. Improved but not completely resolved interstitial edema. 2. Large left lower lobe pulmonary nodule, nearly 3 cm diameter. 3. Suspected scarring or atelectasis at the right lung base. Electronically Signed  By: WVan ClinesM.D.  On: 09/26/2014 16:00  ASSESSMENT / PLAN:  PULMONARY OETT A: Acute on chronic hypoxic respiratory failure in setting of what is likely decompensated diastolic HF w/ pulmonary edema superimposed on underlying GOLD D COPD.  Hx she gives is more c/w decomp HF than typical COPD exacerbation. She skips lasix on  days when she is going out, as for appointments. ATX bases, residual interstitial edema. She is significantly improved  P:   -diuresis further, with instruction not to skip doses.  -Keep f/u appointment with cardiology  Pulmonary Nodule- 3 mm LLL nodule recently found. High risk for CA. She was seeing Dr Joya Gaskins to have this assessed, when she had to be admitted for fluid  overload. She wants to go home now and have this worked up as outpatient. P: - Needs early ROV with [facility] pulm/ [place] to arrange probable needle bx.  Tobacco abuse P: -Continue smoking cessation counseling effort  CARDIOVASCULAR CVL A:  Acute on chronic decompensated diastolic HF  Troponin neg x 3 Doppler Neg for DVT R> L LE edema  Hyperlipidemia P:  - Continue diuresis, IV lasix   RENAL A:   Hypokalemia>>>resolved Metabolic Alkalosis Clinically Volume overload P:   -Diuresis, lower  GASTROINTESTINAL A:   No acute problems P:   Cardiac diet  HEMATOLOGIC A:  Slight thrombocytopenia Positive DDimer w/ asymmetric LE edema  P:  -Monitor on CBC -LE Doppler 6/23 neg -lasix hemonentration noted  INFECTIOUS A:  No acute process Strep pneumo antigen neg, PCT <0.10 P:   Low suspicion  ENDOCRINE A:   Mild Hyperglycemia- likely steroid induced P:  - trended glucose  NEUROLOGIC A:  No acute process P:   -Q 4hr assessments RASS goal: 0 Ambulating, with sats   Today: She appears to have reached maximum hospital benefit.  Ok to discharge home to her home regimen with office pulmonary and cardiac f/u/   CD Annamaria Boots, MD [facility] 09/27/2014 8:04 AM

## 2014-09-27 NOTE — Progress Notes (Signed)
Patient discharge teaching given, including activity, diet, follow-up appoints, and medications. Patient verbalized understanding of all discharge instructions. IV access was d/c'd. Vitals are stable. Skin is intact except as charted in most recent assessments. Pt to be escorted out by NT, to be driven home by family.  Jillyn Ledger, MBA, BS, RN

## 2014-09-28 ENCOUNTER — Other Ambulatory Visit: Payer: Self-pay | Admitting: Acute Care

## 2014-09-28 DIAGNOSIS — I5023 Acute on chronic systolic (congestive) heart failure: Secondary | ICD-10-CM

## 2014-09-28 NOTE — Care Management Note (Signed)
Case Management Note  Patient Details  Name: [name] MRN: [id number] Date of Birth: Dec 29, 1955  Subjective/Objective:                    Action/Plan: CM notes pt discharged after hours; no HH orders or F2F placed and pt is HRI.  Cm called Kary Kos, PA and requested he place [facility] resumption orders with F2F.  [facility] aware.  No other Cm needs were communicated.  Expected Discharge Date:                  Expected Discharge Plan:  [place]  In-House Referral:     Discharge planning Services     Post Acute Care Choice:    Choice offered to:     DME Arranged:    DME Agency:  [place]:    [facility] Agency:     Status of Service:  Completed, signed off  Medicare Important Message Given:    Date Medicare IM Given:    Medicare IM give by:    Date Additional Medicare IM Given:    Additional Medicare Important Message give by:     If discussed at [place] of Stay Meetings, dates discussed:    Additional Comments:  Dellie Catholic, RN 09/28/2014, 4:37 PM

## 2014-09-29 ENCOUNTER — Other Ambulatory Visit: Payer: Self-pay | Admitting: *Deleted

## 2014-09-29 ENCOUNTER — Encounter: Payer: Self-pay | Admitting: Pharmacist

## 2014-09-29 NOTE — Patient Outreach (Signed)
Received a pharmacy referral for Ms. Openshaw to address medicaiton non compliance. Note that per this referral, Ms. Carthen is taking 3 to 4 BC powders along with her daily aspirn and that she is not taking her Lasix. Note that Ms. Posner was also in the hospital from 09/24/14 to 09/27/14 for acute on chronic hypoxic respiratory failure and was noted to have had a 10 pound weight gain.  [place] to discuss joining her for a home visit with the patient. Brayton Layman reports that she is going to see the patient on Thrusday, but that, at this time, the patient reports that she does not need any pharmacy assistance. Spoke with [facility] about concerns for her BC powder use and need for Lasix use. Brayton Layman reports that she has spoken with Ms. Sorn about each of these concerns.   At this time, will close the pharmacy episode as patient refuses to participate. Asked Brayton Layman to please let me know if the patient is interested in speaking with pharmacy in the future.  Harlow Asa, PharmD Clinical Pharmacist [facility] [phone]

## 2014-09-29 NOTE — Patient Outreach (Signed)
Referral received from [facility], requesting assistance with care management of patient.  Patient is known to [facility], has had several admissions over the past 6 months, and has refused involvement with [facility] several times in the past.  Most recently discharged 09/27/14.  Call placed to member in attempt to engage member in involvement with [facility] once again.  Reintroduced self, [facility] care management services explained, identity verified.  Member states that she understands the program and is aware of the resources that could be offered, however is reluctant to agree to services.  Multiple admissions discussed and the opportunity to assist member with remaining out of hospital.  Although member is still reluctant, she does agree to services.    Member reports that she does understand her discharge instructions, however, she admits that she has not been following them, particularly the diet instructions, monitoring weights, taking Lasix, and smoking cessation.  Member states that she does not have a reason for not following instructions, but also states that she does not like going in and out of the hospital.  Discussed with member the importance of following instructions to decrease risk of readmission.  Member states that she does not take her Lasix as instructed because it makes her "go to the bathroom too much, then I get dehydrated."  Member made aware of the benefits of taking the lasix, to decrease fluid buildup, and she states that she will try to take it at least once a day.  Member instructed to follow physician instructions and take as prescribed, member states that she will try.  Discussed with member the importance of monitoring daily weights.  Member states that she does have a scale.  Suggestion made that member place scale in a place where she will see it on a daily basis, preferably in the bathroom, as motivation to weigh self.  Member states "I will try."  Member reports that she does know that  she should be on a low salt diet, but denies adhering to it.  She states she is aware of foods that are high in sodium, and what foods to stay away from, providing some examples.  Encouraged to adhere to prescribed diet.  Complete list of medications reviewed, including concern for member taking several BC powders for headaches.  Member denies taking several, and states that she only takes one when she needs it.    Discussed the possibility of a home visit with [facility], to include the pharmacist and social worker along with this care Freight forwarder.  Member questions "do they both have to come at the same time?"  Member made aware that it would benefit her to have all disciplines involved, but she declines involvement with pharmacist.  She states that she does not have a problem managing or affording her medications.  She does state that transportation continues to be a concern of hers.  Member agrees to have home visit scheduled for this Thursday, and made aware that the social worker would be invited to attend the visit.  Member denies any further concerns at this time.  Encouraged to contact this care manager with any questions.  Contact information provided.  Will notify pharmacist of member's decision not to be involved with that specific discipline at this time.  Valente David, BSN, [place] Management  [facility] Care Manager [phone]

## 2014-10-01 ENCOUNTER — Telehealth: Payer: Self-pay | Admitting: Internal Medicine

## 2014-10-01 ENCOUNTER — Other Ambulatory Visit: Payer: Self-pay | Admitting: Internal Medicine

## 2014-10-01 NOTE — Telephone Encounter (Signed)
Calling with concerns for patient about leg cramps.

## 2014-10-01 NOTE — Telephone Encounter (Signed)
Talked with pt - problems with leg cramps past few days since taking generic Lasix correctly. Problems with transportation and prefers not to come in to clinic. Bertram Denver will call pt about appt  10/08/14. If any change - to call clinic. Return call to [place] [facility]. Hilda Blades Ruperto Kiernan RN 10/01/14 1:50PM

## 2014-10-02 ENCOUNTER — Other Ambulatory Visit: Payer: Commercial Managed Care - HMO | Admitting: *Deleted

## 2014-10-02 ENCOUNTER — Other Ambulatory Visit: Payer: Self-pay | Admitting: *Deleted

## 2014-10-02 NOTE — Patient Outreach (Signed)
[facility]) Care Management   10/02/2014  AIYANNAH FAYAD 06-Dec-1955 [id number]  JULITZA RICKLES is an 59 y.o. female  Subjective:   Objective:   Review of Systems  Constitutional: Negative.   HENT: Negative.   Eyes: Negative.   Respiratory: Negative.   Cardiovascular: Negative.   Gastrointestinal: Negative.   Genitourinary: Negative.   Musculoskeletal: Negative.   Skin: Negative.   Neurological: Negative.   Endo/Heme/Allergies: Negative.   Psychiatric/Behavioral: Negative.     Physical Exam  Constitutional: She is oriented to person, place, and time. She appears well-developed and well-nourished.  Neck: Normal range of motion.  Cardiovascular: Normal rate, regular rhythm and normal heart sounds.   Respiratory: Effort normal and breath sounds normal.  GI: Soft. Bowel sounds are normal.  Musculoskeletal: Normal range of motion.  Neurological: She is alert and oriented to person, place, and time.  Skin: Skin is warm and dry.   BP 112/68 mmHg  Pulse 84  Resp 20  Ht 1.651 m (_0 )  Wt 245 lb (111.131 kg)  BMI 40.77 kg/m2  SpO2 96%  Current Medications:   Current Outpatient Prescriptions  Medication Sig Dispense Refill  . albuterol (PROVENTIL) (2.5 MG/3ML) 0.083% nebulizer solution Take 3 mLs (2.5 mg total) by nebulization every 6 (six) hours as needed for shortness of breath. 360 mL 3  . aspirin 81 MG chewable tablet Chew 1 tablet (81 mg total) by mouth daily. (Patient taking differently: Chew 81 mg by mouth at bedtime. ) 30 tablet 11  . Aspirin-Salicylamide-Caffeine (BC HEADACHE PO) Take 1 Package by mouth daily as needed (pain).    . Fluticasone-Salmeterol (ADVAIR DISKUS) 250-50 MCG/DOSE AEPB Inhale 1 puff into the lungs 2 (two) times daily. 60 each 5  . furosemide (LASIX) 40 MG tablet Take 1 tablet (40 mg total) by mouth 2 (two) times daily. 60 tablet 6  . lovastatin (MEVACOR) 40 MG tablet Take 1 tablet (40 mg total) by mouth at bedtime. 30 tablet 5  .  pantoprazole (PROTONIX) 40 MG tablet TAKE 1 TABLET(40 MG) BY MOUTH DAILY 90 tablet 0  . tiotropium (SPIRIVA HANDIHALER) 18 MCG inhalation capsule Place 1 capsule (18 mcg total) into inhaler and inhale daily. 30 capsule 12  . VENTOLIN HFA 108 (90 BASE) MCG/ACT inhaler Inhale 2 puffs into the lungs every 6 (six) hours as needed.  2   No current facility-administered medications for this visit.    Functional Status:   In your present state of health, do you have any difficulty performing the following activities: 10/02/2014 09/25/2014  Hearing? N N  Vision? Y Y  Difficulty concentrating or making decisions? [name]  Walking or climbing stairs? Y Y  Dressing or bathing? N N  Doing errands, shopping? [name]  Preparing Food and eating ? Y -  Using the Toilet? - -  In the past six months, have you accidently leaked urine? N -  Do you have problems with loss of bowel control? N -  Managing your Medications? N -  Managing your Finances? Y -  Housekeeping or managing your Housekeeping? Y -    Fall/Depression Screening:    PHQ 2/9 Scores 10/02/2014 09/11/2014 07/23/2014 04/10/2014 02/10/2014 01/22/2014 12/02/2013  PHQ - 2 Score 1 2 0 1 0 2 0  PHQ- 9 Score - 10 - - 0 11 -    Assessment:    Arrived at [employer] home for joint visit with social worker, C. Land.  Member has door open in preparation  of home visit.  Member currently lives in a converted garage with her 72 year old grandson.  Member has history of CHF, COPD, and is home oxygen.  Member has visible irritants in residence that would trigger exacerbation of her condition (spiderwebs on the walls/ceilings, dog hair, dust/dirt) on multiple surfaces.  Member also has carpet in the garage that presents a hazard.  Member states that she knows it need to be done, but that she will "get to it when I can."  She reports that she does try to clean sometimes it has gotten too bad, but right now she "don't even care about it anymore."  This care manager explained the  effect that the irritants has on her health, asking if she has someone (including grandson) that can help her clean.  Member states that the grandson has "anger issues" and that she does not like to bother him.  She does state she will look into having some family/friends come to help her clean up.  Social worker questions about saving money to hire someone to do an initial clean and she keep it clean going forward.  She states she does not have the funds available to have that done.  Member also states that she does not have the financial means to visit different specialist (cardiology and pulmonology) as instructed.  Social worker inquires about the possibility of Medicaid, member states that she has applied for it in the past, but not recently.  She states that she did not qualify the last time she applied.  Social worker offers assistance in applying for it again in hopes of being able to go to specialist.  Member also states that she only eat one meal a day because she can't afford food on a regular basis.  This care manager discussed options of healthy inexpensive meals, particularly for breakfast (oatmeal, cream of wheat, etc), but member states that she is a picky eater and will not eat things like that.  She also reports that she does not eat any vegetables, and does not desire to change her eating habits.  Social worker informed member that will look into other resources to help provide food.  Member reports that she is taking all medications as prescribed except for BC powder and Lasix.  Member states that she will not take the Lasix twice a day because it makes her go to the bathroom too much.  Member reports taking up to 2-3 [facility] powders/day.  Member informed of concern with BC powder in addition to taking her daily aspirin.  Member states that she will stop taking her daily aspirin before stopping her BC powder.  Member encouraged to discuss her feelings toward the Lasix and the [facility] powder with her  primary care physician on her next visit.  Encouraged to take as prescribed until her visit.  Member states that she is taking her inhalers appropriately, although she is not rinsing her mouth out after use.  She states that she will begin to do so.  Member has working scale, but does not weigh self, stating "I don't know why, I just don't."  Member encouraged to do so, provided a [facility] calendar to record readings.  Member also provided with education materials for CHF and COPD, zones for each condition discussed.  Member states that she spoke with A. Coley from [place], and was told that she has a follow up appointment for next week.  Member is unsure of appointment.  This care manager offered to  call either physician office or Ms. Coley to confirm appointment, member states that she would like to do that herself.  Encouraged to do so as soon as possible in order to arrange transportation.  Member verbalizes understanding.  Member denies any further concerns at this time.  Contact information provided, encouraged to use 24 hour nurse line or to contact this care manager with any questions.    Plan:   Will continue with transition of care calls next week. Will contact Sheryn Bison with [facility] to provide update on member involvement. Will schedule routine visit for next month.  [facility] CM Care Plan Problem One        Patient Outreach from 10/02/2014 in [place] Problem One  recent hospitalization for CHF exacerbation   Care Plan for Problem One  Active   [facility]N Long Term Goal (31-90 days)  Member will remain out of hospital for the next 31 days   THN Long Term Goal Start Date  09/29/14   Interventions for Problem One Long Term Goal  Discussed education plans for CHF including zones   THN CM Short Term Goal #1 (0-30 days)  Member will weigh self every day and record weights for the next 4 weeks   THN CM Short Term Goal #1 Start Date  09/29/14   Interventions for Short Term Goal #1   Discussed the importance of monitoring daily weights in effort to assess fluid status   THN CM Short Term Goal #2 (0-30 days)  Mmber will take medications as prescribed for the next 4 weeks   THN CM Short Term Goal #2 Start Date  09/29/14   Interventions for Short Term Goal #2  Educated on the purpose and importance of taking medications exactly as prescribed and notifying physician if there are any problems   THN CM Short Term Goal #3 (0-30 days)  Member will make follow up appointments with primary physician and cardiologist within the next 2 weeks   THN CM Short Term Goal #3 Start Date  09/29/14    [place] Problem Two        Patient Outreach Telephone from 07/25/2014 in [place] Problem Two  Patient lacks financial resources to pay monthly expenses and medical procedures.   Care Plan for Problem Two  Not Active   THN CM Short Term Goal #1 (0-30 days)  CSW will provide patient with a list of community agencies and resources that offer assistance with finances and assist patient with completion of applications, within the next two weeks.   THN CM Short Term Goal #1 Start Date  07/25/14   [facility] CM Short Term Goal #1 Met Date   07/25/14   Interventions for Short Term Goal #2   Patient and CSW will review a list of community agenices and resources that may be able to offer financial assistance and CSW will assist patient with completion of applications.   THN CM Short Term Goal #2 (0-30 days)  CSW will provide patient with an Adult Medicaid application and assist with completion, within the next two weeks.   THN CM Short Term Goal #2 Start Date  07/25/14   [facility] CM Short Term Goal #2 Met Date  07/25/14   Interventions for Short Term Goal #2  After Adult Medicaid application is completed, CSW will submit the application to the [place] for processing.      THN CM  Care Plan Problem Three        Patient Outreach from 10/02/2014 in  [place] for Problem Three  Active   [facility] CM Short Term Goal #1 Start Date  10/02/14     Valente David, BSN, [place] [phone]

## 2014-10-02 NOTE — Patient Outreach (Signed)
[facility]) Care Management  [facility] Social Work  10/02/2014  JENI DULING July 15, 1955 [id number]  Subjective:    Objective:   Current Medications:  Current Outpatient Prescriptions  Medication Sig Dispense Refill  . albuterol (PROVENTIL) (2.5 MG/3ML) 0.083% nebulizer solution Take 3 mLs (2.5 mg total) by nebulization every 6 (six) hours as needed for shortness of breath. 360 mL 3  . aspirin 81 MG chewable tablet Chew 1 tablet (81 mg total) by mouth daily. (Patient taking differently: Chew 81 mg by mouth at bedtime. ) 30 tablet 11  . Aspirin-Salicylamide-Caffeine (BC HEADACHE PO) Take 1 Package by mouth daily as needed (pain).    . Fluticasone-Salmeterol (ADVAIR DISKUS) 250-50 MCG/DOSE AEPB Inhale 1 puff into the lungs 2 (two) times daily. 60 each 5  . furosemide (LASIX) 40 MG tablet Take 1 tablet (40 mg total) by mouth 2 (two) times daily. 60 tablet 6  . lovastatin (MEVACOR) 40 MG tablet Take 1 tablet (40 mg total) by mouth at bedtime. 30 tablet 5  . pantoprazole (PROTONIX) 40 MG tablet TAKE 1 TABLET(40 MG) BY MOUTH DAILY 90 tablet 0  . tiotropium (SPIRIVA HANDIHALER) 18 MCG inhalation capsule Place 1 capsule (18 mcg total) into inhaler and inhale daily. 30 capsule 12  . VENTOLIN HFA 108 (90 BASE) MCG/ACT inhaler Inhale 2 puffs into the lungs every 6 (six) hours as needed.  2   No current facility-administered medications for this visit.    Functional Status:  In your present state of health, do you have any difficulty performing the following activities: 09/25/2014 09/11/2014  Hearing? N N  Vision? Y Y  Difficulty concentrating or making decisions? Y N  Walking or climbing stairs? Y Y  Dressing or bathing? N N  Doing errands, shopping? Y N  Preparing Food and eating ? - -  Using the Toilet? - -  In the past six months, have you accidently leaked urine? - -  Do you have problems with loss of bowel control? - -  Managing your Medications? - -  Managing your  Finances? - -  Housekeeping or managing your Housekeeping? - -    Fall/Depression Screening:  PHQ 2/9 Scores 09/11/2014 07/23/2014 04/10/2014 02/10/2014 01/22/2014 12/02/2013 08/07/2013  PHQ - 2 Score 2 0 1 0 2 0 0  PHQ- 9 Score 10 - - 0 11 - -    Assessment: Joint visit with RNCM to complete needs assessment.  Patient has been residing in a rented converted garage since  2002. Reports having a 1/2 bathroom and access to the kitchen.  Patient's 60 year old grandson resides with her, offering limited support.  Patient's home cluttered, with visible dog fur, dust and spider  webbs covering the walls and surfaces. RNCM discussed how the condition of her home could affect her health.  Patient verbalized an understanding but states having very little energy to clean.  Patient states that she does not have the funds to pay someone to clean her home, however states that she could probably enlist family and friends to come assist her.  Patient states that she currently uses her [employer] transportation benefit for her transportation needs to medical appointments.  She has also completed the application for  [employer] that will be mailed today to be used as another Building surveyor.   Patient denied having any additional social work needs.  Obtaining food resources through [employer] discussed.  Plan: CSW to follow up with patient regarding food resource through [employer].     Sheralyn Boatman [facility] Care Management [phone]

## 2014-10-03 ENCOUNTER — Telehealth: Payer: Self-pay | Admitting: Internal Medicine

## 2014-10-03 NOTE — Telephone Encounter (Signed)
Return call to Lelon Frohlich at [facility] [phone] - unable to talk. Left a message on ID recording - pt has an appt  10/08/14 9:15AM with Dr Redmond Pulling. Pt is to bring meds to appt. Pt states has problems with transportation. Ann told C. Cyndi Bender pt is non - compliant with diet, wt and meds. Hilda Blades Marcos Ruelas RN 10/03/14 3:25PM

## 2014-10-03 NOTE — Telephone Encounter (Signed)
[name] from [facility] called to report non compliance with pat's diet, weight and meds.  She can be reached @ [phone]

## 2014-10-05 DIAGNOSIS — J9600 Acute respiratory failure, unspecified whether with hypoxia or hypercapnia: Secondary | ICD-10-CM | POA: Diagnosis not present

## 2014-10-05 DIAGNOSIS — R0902 Hypoxemia: Secondary | ICD-10-CM | POA: Diagnosis not present

## 2014-10-05 DIAGNOSIS — J449 Chronic obstructive pulmonary disease, unspecified: Secondary | ICD-10-CM | POA: Diagnosis not present

## 2014-10-07 ENCOUNTER — Encounter: Payer: Self-pay | Admitting: *Deleted

## 2014-10-08 ENCOUNTER — Telehealth: Payer: Self-pay | Admitting: Internal Medicine

## 2014-10-08 ENCOUNTER — Encounter: Payer: Self-pay | Admitting: Internal Medicine

## 2014-10-08 ENCOUNTER — Ambulatory Visit (INDEPENDENT_AMBULATORY_CARE_PROVIDER_SITE_OTHER): Payer: Commercial Managed Care - HMO | Admitting: Internal Medicine

## 2014-10-08 ENCOUNTER — Telehealth: Payer: Self-pay | Admitting: Cardiology

## 2014-10-08 VITALS — BP 115/68 | HR 99 | Temp 98.2°F | Ht 64.5 in | Wt 248.3 lb

## 2014-10-08 DIAGNOSIS — I5033 Acute on chronic diastolic (congestive) heart failure: Secondary | ICD-10-CM

## 2014-10-08 DIAGNOSIS — I509 Heart failure, unspecified: Secondary | ICD-10-CM | POA: Diagnosis not present

## 2014-10-08 DIAGNOSIS — R6889 Other general symptoms and signs: Secondary | ICD-10-CM | POA: Diagnosis not present

## 2014-10-08 LAB — BASIC METABOLIC PANEL WITH GFR
BUN: 9 mg/dL (ref 6–23)
CALCIUM: 9.4 mg/dL (ref 8.4–10.5)
CO2: 36 mEq/L — ABNORMAL HIGH (ref 19–32)
Chloride: 94 mEq/L — ABNORMAL LOW (ref 96–112)
Creat: 0.87 mg/dL (ref 0.50–1.10)
GFR, EST AFRICAN AMERICAN: 84 mL/min
GFR, Est Non African American: 73 mL/min
GLUCOSE: 155 mg/dL — AB (ref 70–99)
Potassium: 3.1 mEq/L — ABNORMAL LOW (ref 3.5–5.3)
SODIUM: 143 meq/L (ref 135–145)

## 2014-10-08 LAB — MAGNESIUM: MAGNESIUM: 1.7 mg/dL (ref 1.5–2.5)

## 2014-10-08 NOTE — Telephone Encounter (Signed)
OK to close note

## 2014-10-08 NOTE — Telephone Encounter (Signed)
Noted. Thanks.

## 2014-10-08 NOTE — Assessment & Plan Note (Addendum)
Reports improvement since admission.  However, is only taking Lasix daily instead of BID due to leg cramping (she decreased to daily about 4 days ago). She also complains of frequent nocturia due BID dosing (evening lasix dose).  SpO2 100%, lung CTA, no dyspnea on home 4L via [place], some LE edema but not severe, weight stable (250 lbs at discharge, 248 today).  She has hx of hospitalization due to respiratory failure with questionable med compliance (lasix).  She says in the past she was only taking lasix intermittently so for her to take it daily is an improvement.  I encouraged her to take BID to avoid pulmonary edema and re-hospitalization but she is resistant.  She tells me she plans to increase to BID if her weight is above 148 otherwise she will only take 77m daily.   - continue Lasix 463m(I advised 4063mID but patient is only willing to take 40 mg daily) - advised to move evening Lasix dose up earlier to reduce nocturia (but she is not willing to take BID anymore) - she will continue to weigh daily and notify HF clinic or OPC of > 3 pound wt gain - BMP today to check electrolytes; add K supplement if necessary - follow-up with cardiology as previously scheduled on 07/22 - return in two months to see PCP as previously scheduled

## 2014-10-08 NOTE — Telephone Encounter (Signed)
Return call to Clair Gulling, Physical Therapist with [facility]. He called patient to set up time for PT evaluation and pt refused to see him.  She states that she was to "Overwhelmed" at this time.  Hospital d/c 6/25

## 2014-10-08 NOTE — Telephone Encounter (Signed)
Calling to advise Dr. Parks Ranger the patient refused the physical therapy services.

## 2014-10-08 NOTE — Patient Instructions (Signed)
1. Please increase your Lasix to 60m twice per day.  You can move the second dose to earlier in the afternoon so it is not close to bedtime and you are not getting up to go to the bathroom as often. I will check your labs today to see if you may have electrolyte problems that may be causing your cramping.  If so, I may be able to prescribe a supplement to help with the cramping.  Please weigh yourself daily and call heart doctor or clinic if your weight goes up by more than 3 pounds.    2. Please take all medications as prescribed.    3. If you have worsening of your symptoms or new symptoms arise, please call the clinic [phone], or go to the ER immediately if symptoms are severe.  Please return for follow-up in September or sooner if you have problems.

## 2014-10-08 NOTE — Telephone Encounter (Signed)
[name] called in wanting to inform Dr.Hochrein that the pt refused PT home health care.   Thanks

## 2014-10-08 NOTE — Progress Notes (Signed)
Subjective:    Patient ID: [name], female    DOB: 03/02/1956, 59 y.o.   MRN: [id number]  HPI Comments: [name] is a 59 year old woman with PMH as below here for hospital follow-up.  Please see problem based charting for assessment and plan.     Past Medical History  Diagnosis Date  . Coronary artery disease     S/P PCI of LAD with DES (12/2008). Total occlusion of RCA noted at that time., medically managed. ACS ruled out 03/2009 with Lexiscan myoview . Followed by [place].  . Pulmonary hypertension     2-D Echo (67/8938) - Systolic pressure was moderately increased. PA peak pressure  36mHg. 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 Followed by Dr. WJoya Gaskins(Velora Heckler  . Diastolic dysfunction     2-D Echo (12/2008) - Normal LV Systolic funciton with EF 60-65%. Grade 1 diastolid dysfunction. No regional wall motion abnormalities. Moderate pulmonary HTN with PA peak pressure 516mg.  . Marland KitchenOPD (chronic obstructive pulmonary disease)     Severe. Gold Stage IV.  PFTs (12/2008) - severe obstructive airway disease. Active tobacco use. Requires 4L O2 at home.  . Pulmonary nodule, right     Small right middle lobe nodule. Stable as of 12/2008.  . Marland Kitchenrediabetes 12/2008    HgbA1c 6.4 (12/2008)  . Hx MRSA infection     Recurrent MRSA thigh abscesses.  . Tobacco abuse     Ongoing.  . Obesity   . Hyperlipidemia   . GERD (gastroesophageal reflux disease)     S/P Nissen fundoplication.  . CHF (congestive heart failure)   . Shortness of breath dyspnea    Current Outpatient Prescriptions on File Prior to Visit  Medication Sig Dispense Refill  . albuterol (PROVENTIL) (2.5 MG/3ML) 0.083% nebulizer solution Take 3 mLs (2.5 mg total) by nebulization every 6 (six) hours as needed for shortness of breath. 360 mL 3  . aspirin 81 MG chewable tablet Chew 1 tablet (81 mg total) by mouth daily. (Patient  taking differently: Chew 81 mg by mouth at bedtime. ) 30 tablet 11  . Aspirin-Salicylamide-Caffeine (BC HEADACHE PO) Take 1 Package by mouth daily as needed (pain).    . Fluticasone-Salmeterol (ADVAIR DISKUS) 250-50 MCG/DOSE AEPB Inhale 1 puff into the lungs 2 (two) times daily. 60 each 5  . furosemide (LASIX) 40 MG tablet Take 1 tablet (40 mg total) by mouth 2 (two) times daily. 60 tablet 6  . lovastatin (MEVACOR) 40 MG tablet Take 1 tablet (40 mg total) by mouth at bedtime. 30 tablet 5  . pantoprazole (PROTONIX) 40 MG tablet TAKE 1 TABLET(40 MG) BY MOUTH DAILY 90 tablet 0  . tiotropium (SPIRIVA HANDIHALER) 18 MCG inhalation capsule Place 1 capsule (18 mcg total) into inhaler and inhale daily. 30 capsule 12  . VENTOLIN HFA 108 (90 BASE) MCG/ACT inhaler Inhale 2 puffs into the lungs every 6 (six) hours as needed.  2   No current facility-administered medications on file prior to visit.    Review of Systems  Constitutional: Negative for fever, chills and appetite change.  Respiratory: Positive for shortness of breath. Negative for cough and wheezing.        Baseline dyspnea.  Breathing well on usual 4L via [place].   Cardiovascular: Positive for leg swelling. Negative for chest pain.  Gastrointestinal: Negative for nausea, vomiting, abdominal pain, diarrhea, constipation and blood in stool.  Genitourinary: Negative for dysuria and hematuria.       Normal UOP.  Neurological: Negative for syncope and light-headedness.       Filed Vitals:   10/08/14 0912  BP: 115/68  Pulse: 99  Temp: 98.2 F (36.8 C)  TempSrc: Oral  Height: 5' 4.5" (1.638 m)  Weight: 248 lb 4.8 oz (112.628 kg)  SpO2: 93%     Objective:   Physical Exam  Constitutional: She is oriented to person, place, and time. She appears well-developed. No distress.  HENT:  Head: Normocephalic and atraumatic.  Mouth/Throat: Oropharynx is clear and moist. No oropharyngeal exudate.  Eyes: EOM are normal. Pupils are equal, round, and  reactive to light.  Neck: Neck supple.  Cardiovascular: Normal rate, regular rhythm and normal heart sounds.  Exam reveals no gallop and no friction rub.   No murmur heard. Pulmonary/Chest: Effort normal and breath sounds normal. No respiratory distress. She has no wheezes. She has no rales.  Abdominal: Soft. Bowel sounds are normal. She exhibits no distension. There is no tenderness. There is no rebound.  Musculoskeletal: Normal range of motion. She exhibits edema. She exhibits no tenderness.  1+ B/L LE edema.  Neurological: She is alert and oriented to person, place, and time. No cranial nerve deficit.  Skin: Skin is warm. She is not diaphoretic.  Psychiatric: She has a normal mood and affect. Her behavior is normal.  Vitals reviewed.         Assessment & Plan:  Please see problem based charting for assessment and plan.

## 2014-10-09 ENCOUNTER — Other Ambulatory Visit: Payer: Self-pay | Admitting: Internal Medicine

## 2014-10-09 ENCOUNTER — Other Ambulatory Visit: Payer: Self-pay | Admitting: *Deleted

## 2014-10-09 DIAGNOSIS — E876 Hypokalemia: Secondary | ICD-10-CM

## 2014-10-09 MED ORDER — POTASSIUM CHLORIDE ER 20 MEQ PO TBCR: 40.0000 meq | EXTENDED_RELEASE_TABLET | Freq: Every day | ORAL | Status: DC

## 2014-10-09 NOTE — Patient Outreach (Signed)
Call placed to member for weekly transition of care program follow up.  Member states that she is "fine."  This care manager inquired about the member's PCP appointment yesterday, member responds with "it was a waste of time."  She states that all the physician wanted to do was "harp on certain things" that she feels are not a concern.  She continues to refuse to take he Lasix twice a day despite being advised to.  She states that she will take it once a day and that she will keep trying to weigh herself daily, recording the weights.  This care manager inquired about next appointment, member reports that she doesn't know and that she "just go to keep yall off my back."    Member reports that she received a voice message from a respiratory therapist stating that they needed to come to her home due to a report of her smoking in her home with oxygen.  Member states that she is not happy about this and that she "don't need them to come to my house."  Member states that she is aware of the dangers of smoking with oxygen.    This care manager discussed with member the need to schedule another home visit for this month.  Member states that she is not certain that she would like another home visit.  She states that she knows what she is supposed to be doing.  Member encouraged to give some thought to allowing a second home visit.  This care manager will continue with transition of care calls next week and will discuss scheduling another visit at that time.  Will also continue to discuss physician instructions and advised plan of care.  Valente David, BSN, [place] Management  [facility] Care Manager [phone]

## 2014-10-10 NOTE — Progress Notes (Signed)
Clarification of KCL called to pharmacy.  Total of 40 meq daily.

## 2014-10-10 NOTE — Telephone Encounter (Signed)
This was a new med when seen by Dr Redmond Pulling this month. Next appt isn't till Sep with Dr Eppie Gibson and I do not want to cont KCl without rechecking the K. Can she come in early next week for BMP recheck?

## 2014-10-10 NOTE — Telephone Encounter (Signed)
Dr Redmond Pulling ordered KCL 40 meq daily.  Pharmacy sent in this refill on same day.

## 2014-10-13 NOTE — Progress Notes (Signed)
Internal Medicine Clinic Attending  Case discussed with Dr. Redmond Pulling soon after the resident saw the patient.  We reviewed the resident's history and exam and pertinent patient test results.  I agree with the assessment, diagnosis, and plan of care documented in the resident's note.

## 2014-10-14 ENCOUNTER — Other Ambulatory Visit: Payer: Self-pay | Admitting: *Deleted

## 2014-10-14 ENCOUNTER — Other Ambulatory Visit: Payer: Self-pay | Admitting: Internal Medicine

## 2014-10-14 NOTE — Patient Outreach (Addendum)
[facility]) Care Management  10/14/2014  TYNISA VOHS 1955-10-25 [id number]   Phone call to patient to complete Medicaid application.  Medicaid application completed by phone.  This Education officer, museum will take application to patient's home for signature today at 4 pm and then will submit application to the Department of [place].  This Education officer, museum will also provide a list of documents she will need to complete eligibility process so that she can prepare for it ahead of time.    Sheralyn Boatman [facility] Care Management [phone]

## 2014-10-15 ENCOUNTER — Encounter: Payer: Self-pay | Admitting: *Deleted

## 2014-10-15 ENCOUNTER — Other Ambulatory Visit: Payer: Self-pay | Admitting: *Deleted

## 2014-10-15 NOTE — Patient Outreach (Signed)
Call placed for Transition of Care.  Member reports that she is doing well.  She states that she has continued to only take her Lasix once a day.  She reports a 4 pound weight gain in one day over the weekend, but she denies taking the Lasix twice a day at that time.  She reports that her weight came back down by 5 pounds the next day.  She states that she would have taken an extra dose of Lasix had she gained more weight.  As of right now, she states she will continue to take it daily.   As an update for a previous conversation, she reports that the call that she received regarding her oxygen equipment was from [place].  She denies providing them with a call back, stating that she no longer uses them therefore there is no need for them to make a visit.  She reports using [facility], but states that she can no longer get tanks delivered due to having unpaid copays.  She states that she is able to have someone pick up tanks for her.  She states she is now down to two tanks and will have her daughter go pick up more.    This care manger inquires about a home visit, member continues to be reluctant to schedule home visit, however does agree to a home visit for this month.  Member denies any further questions or concerns at this time.  Encouraged to contact this care manager with any concerns.  Valente David, BSN, [place] Management  [facility] Care Manager [phone]

## 2014-10-15 NOTE — Patient Instructions (Signed)
2

## 2014-10-15 NOTE — Patient Outreach (Signed)
[facility]) Care Management  10/15/2014  [name] 1956-01-09 [id number]   This social worker mailed patient's Medicaid application to the [place], [place] [id number].  Patient should receive correspondence letter from the Department of Social Service within the next 30 days.   Sheralyn Boatman [facility] Care Management [phone]

## 2014-10-16 ENCOUNTER — Encounter: Payer: Self-pay | Admitting: *Deleted

## 2014-10-21 ENCOUNTER — Other Ambulatory Visit: Payer: Self-pay | Admitting: Internal Medicine

## 2014-10-21 ENCOUNTER — Other Ambulatory Visit: Payer: Self-pay | Admitting: *Deleted

## 2014-10-21 ENCOUNTER — Encounter: Payer: Self-pay | Admitting: *Deleted

## 2014-10-21 DIAGNOSIS — E876 Hypokalemia: Secondary | ICD-10-CM

## 2014-10-21 NOTE — Patient Outreach (Addendum)
Call placed to member to follow up on last week's call.  Member states "I'm ok."  Member remains reluctant to engage in conversation, continues to deny taking Lasix as prescribed.  She reports gaining weight over the weekend, but that she is beginning to lose it now.  Member still is not keeping a record of weights.  Member continues to smoke, stating no interest in cessation.  Member continues to be noncompliant with care, including medication management, self-care (cleaning of home to decrease risk of exacerbation), and is reluctant to any care provided by [facility].  Collaboration with this Transport planner, Education officer, museum (Kenyon Ana), Tax inspector Marla Roe), [place] Director (Dr. Verl Blalock), and [facility] representatives, decision made to close case due to member's noncompliance and failure to progress towards goal.    Member made aware of case closure due to failure to progress towards goal.  Member states "ok, that's fine if that's what yall want to do."  Member encouraged to call [facility] main office if she was interested in reestablishing care with [facility].  Member verbalizes understanding.  Will send case closure letter to physician and notify care manager assistant to close case.  Valente David, BSN, [place] Management  [facility] Care Manager [phone]

## 2014-10-22 ENCOUNTER — Other Ambulatory Visit: Payer: Commercial Managed Care - HMO

## 2014-10-24 ENCOUNTER — Ambulatory Visit: Payer: Commercial Managed Care - HMO | Admitting: Cardiology

## 2014-10-29 ENCOUNTER — Observation Stay (HOSPITAL_COMMUNITY)
Admission: EM | Admit: 2014-10-29 | Discharge: 2014-10-30 | Disposition: A | Discharge status: Home / Self Care | Payer: Commercial Managed Care - HMO | Attending: Emergency Medicine | Admitting: Emergency Medicine

## 2014-10-29 ENCOUNTER — Emergency Department (HOSPITAL_COMMUNITY): Payer: Commercial Managed Care - HMO

## 2014-10-29 ENCOUNTER — Encounter (HOSPITAL_COMMUNITY): Payer: Self-pay | Admitting: *Deleted

## 2014-10-29 DIAGNOSIS — Z7982 Long term (current) use of aspirin: Secondary | ICD-10-CM | POA: Diagnosis not present

## 2014-10-29 DIAGNOSIS — R0602 Shortness of breath: Secondary | ICD-10-CM | POA: Diagnosis not present

## 2014-10-29 DIAGNOSIS — R918 Other nonspecific abnormal finding of lung field: Secondary | ICD-10-CM | POA: Diagnosis not present

## 2014-10-29 DIAGNOSIS — R3915 Urgency of urination: Secondary | ICD-10-CM | POA: Insufficient documentation

## 2014-10-29 DIAGNOSIS — I272 Other secondary pulmonary hypertension: Secondary | ICD-10-CM | POA: Diagnosis not present

## 2014-10-29 DIAGNOSIS — Z9114 Patient's other noncompliance with medication regimen: Secondary | ICD-10-CM | POA: Diagnosis not present

## 2014-10-29 DIAGNOSIS — E876 Hypokalemia: Secondary | ICD-10-CM | POA: Diagnosis present

## 2014-10-29 DIAGNOSIS — R300 Dysuria: Secondary | ICD-10-CM | POA: Diagnosis not present

## 2014-10-29 DIAGNOSIS — R51 Headache: Secondary | ICD-10-CM | POA: Diagnosis not present

## 2014-10-29 DIAGNOSIS — R531 Weakness: Secondary | ICD-10-CM | POA: Diagnosis not present

## 2014-10-29 DIAGNOSIS — J449 Chronic obstructive pulmonary disease, unspecified: Secondary | ICD-10-CM | POA: Diagnosis not present

## 2014-10-29 DIAGNOSIS — E785 Hyperlipidemia, unspecified: Secondary | ICD-10-CM | POA: Insufficient documentation

## 2014-10-29 DIAGNOSIS — F17210 Nicotine dependence, cigarettes, uncomplicated: Secondary | ICD-10-CM | POA: Diagnosis not present

## 2014-10-29 DIAGNOSIS — Z79899 Other long term (current) drug therapy: Secondary | ICD-10-CM | POA: Insufficient documentation

## 2014-10-29 DIAGNOSIS — Z6841 Body Mass Index (BMI) 40.0 and over, adult: Secondary | ICD-10-CM | POA: Insufficient documentation

## 2014-10-29 DIAGNOSIS — Z888 Allergy status to other drugs, medicaments and biological substances status: Secondary | ICD-10-CM | POA: Insufficient documentation

## 2014-10-29 DIAGNOSIS — I2510 Atherosclerotic heart disease of native coronary artery without angina pectoris: Secondary | ICD-10-CM | POA: Diagnosis not present

## 2014-10-29 DIAGNOSIS — K219 Gastro-esophageal reflux disease without esophagitis: Secondary | ICD-10-CM | POA: Diagnosis not present

## 2014-10-29 DIAGNOSIS — R079 Chest pain, unspecified: Principal | ICD-10-CM | POA: Diagnosis present

## 2014-10-29 DIAGNOSIS — I5032 Chronic diastolic (congestive) heart failure: Secondary | ICD-10-CM | POA: Diagnosis not present

## 2014-10-29 DIAGNOSIS — G4733 Obstructive sleep apnea (adult) (pediatric): Secondary | ICD-10-CM | POA: Diagnosis present

## 2014-10-29 DIAGNOSIS — R350 Frequency of micturition: Secondary | ICD-10-CM | POA: Diagnosis not present

## 2014-10-29 DIAGNOSIS — R0789 Other chest pain: Secondary | ICD-10-CM | POA: Diagnosis not present

## 2014-10-29 DIAGNOSIS — E669 Obesity, unspecified: Secondary | ICD-10-CM | POA: Insufficient documentation

## 2014-10-29 DIAGNOSIS — R0600 Dyspnea, unspecified: Secondary | ICD-10-CM | POA: Diagnosis present

## 2014-10-29 DIAGNOSIS — R000 Tachycardia, unspecified: Secondary | ICD-10-CM | POA: Diagnosis not present

## 2014-10-29 DIAGNOSIS — I5030 Unspecified diastolic (congestive) heart failure: Secondary | ICD-10-CM | POA: Diagnosis present

## 2014-10-29 DIAGNOSIS — I7789 Other specified disorders of arteries and arterioles: Secondary | ICD-10-CM | POA: Diagnosis not present

## 2014-10-29 LAB — COMPREHENSIVE METABOLIC PANEL
ALK PHOS: 116 U/L (ref 38–126)
ALT: 8 U/L — ABNORMAL LOW (ref 14–54)
AST: 14 U/L — ABNORMAL LOW (ref 15–41)
Albumin: 3 g/dL — ABNORMAL LOW (ref 3.5–5.0)
Anion gap: 14 (ref 5–15)
BUN: <5 mg/dL — ABNORMAL LOW (ref 6–20)
CO2: 37 mmol/L — ABNORMAL HIGH (ref 22–32)
Calcium: 8.5 mg/dL — ABNORMAL LOW (ref 8.9–10.3)
Chloride: 82 mmol/L — ABNORMAL LOW (ref 101–111)
Creatinine, Ser: 1 mg/dL (ref 0.44–1.00)
GFR calc Af Amer: >60 mL/min (ref >60)
GFR calc non Af Amer: 60 mL/min — ABNORMAL LOW (ref >60)
Glucose, Bld: 162 mg/dL — ABNORMAL HIGH (ref 65–99)
POTASSIUM: 2.1 mmol/L — AB (ref 3.5–5.1)
Sodium: 133 mmol/L — ABNORMAL LOW (ref 135–145)
Total Bilirubin: 1 mg/dL (ref 0.3–1.2)
Total Protein: 6.6 g/dL (ref 6.5–8.1)

## 2014-10-29 LAB — URINALYSIS, ROUTINE W REFLEX MICROSCOPIC
Glucose, UA: NEGATIVE mg/dL
Hgb urine dipstick: NEGATIVE
KETONES UR: 15 mg/dL — AB
Nitrite: NEGATIVE
PH: 6 (ref 5.0–8.0)
Protein, ur: NEGATIVE mg/dL
Specific Gravity, Urine: 1.02 (ref 1.005–1.030)
Urobilinogen, UA: 1 mg/dL (ref 0.0–1.0)

## 2014-10-29 LAB — CBC WITH DIFFERENTIAL/PLATELET
BASOS ABS: 0 10*3/uL (ref 0.0–0.1)
BASOS PCT: 0 % (ref 0–1)
Eosinophils Absolute: 0.2 10*3/uL (ref 0.0–0.7)
Eosinophils Relative: 2 % (ref 0–5)
HCT: 41.9 % (ref 36.0–46.0)
HEMOGLOBIN: 14.2 g/dL (ref 12.0–15.0)
LYMPHS PCT: 14 % (ref 12–46)
Lymphs Abs: 1.8 10*3/uL (ref 0.7–4.0)
MCH: 32.3 pg (ref 26.0–34.0)
MCHC: 33.9 g/dL (ref 30.0–36.0)
MCV: 95.4 fL (ref 78.0–100.0)
MONO ABS: 1 10*3/uL (ref 0.1–1.0)
MONOS PCT: 7 % (ref 3–12)
Neutro Abs: 10.2 10*3/uL — ABNORMAL HIGH (ref 1.7–7.7)
Neutrophils Relative %: 77 % (ref 43–77)
Platelets: 227 10*3/uL (ref 150–400)
RBC: 4.39 MIL/uL (ref 3.87–5.11)
RDW: 13.9 % (ref 11.5–15.5)
WBC: 13.2 10*3/uL — ABNORMAL HIGH (ref 4.0–10.5)

## 2014-10-29 LAB — I-STAT CG4 LACTIC ACID, ED
Lactic Acid, Venous: 1.43 mmol/L (ref 0.5–2.0)
Lactic Acid, Venous: 0.72 mmol/L (ref 0.5–2.0)

## 2014-10-29 LAB — URINE MICROSCOPIC-ADD ON

## 2014-10-29 LAB — BRAIN NATRIURETIC PEPTIDE: B NATRIURETIC PEPTIDE 5: 54.5 pg/mL (ref 0.0–100.0)

## 2014-10-29 LAB — I-STAT TROPONIN, ED: TROPONIN I, POC: 0.02 ng/mL (ref 0.00–0.08)

## 2014-10-29 MED ORDER — POTASSIUM CHLORIDE CRYS ER 20 MEQ PO TBCR
80.0000 meq | EXTENDED_RELEASE_TABLET | Freq: Once | ORAL | Status: AC
  Administered 2014-10-29: 80 meq via ORAL
  Filled 2014-10-29: qty 4

## 2014-10-29 MED ORDER — IOHEXOL 350 MG/ML SOLN
100.0000 mL | Freq: Once | INTRAVENOUS | Status: AC | PRN
  Administered 2014-10-29: 100 mL via INTRAVENOUS

## 2014-10-29 MED ORDER — SODIUM CHLORIDE 0.9 % IV BOLUS (SEPSIS)
1000.0000 mL | Freq: Once | INTRAVENOUS | Status: AC
  Administered 2014-10-29: 1000 mL via INTRAVENOUS

## 2014-10-29 MED ORDER — POTASSIUM CHLORIDE 10 MEQ/100ML IV SOLN
10.0000 meq | INTRAVENOUS | Status: AC
  Administered 2014-10-29 (×2): 10 meq via INTRAVENOUS
  Filled 2014-10-29 (×3): qty 100

## 2014-10-29 NOTE — ED Notes (Signed)
Upon arrival pt had to use restroom before allowing an EKG to be completed.

## 2014-10-29 NOTE — ED Notes (Signed)
Critical K+ reported to [facility]

## 2014-10-29 NOTE — ED Provider Notes (Signed)
CSN: [id number]     Arrival date & time 10/29/14  1724 History   First MD Initiated Contact with Patient 10/29/14 1739     Chief Complaint  Patient presents with  . Chest Pain  . Shortness of Breath     (Consider location/radiation/quality/duration/timing/severity/associated sxs/prior Treatment) The history is provided by the patient and medical records. No language interpreter was used.     [name] is a 59 y.o. female  with a hx of CAD, pulmonary HTN, diastolic dysfunction, CPOD, NIDDM, CHF, medication noncompliance presents to the Emergency Department complaining of gradual, persistent, progressively worsening CP and SOB onset 10am today.  Pt wears oxygen at home 4L via [place]. EMS reports that the patient does not have air conditioning at home.  Pt was given ASA and Nitro x1 enroute without relief.  Pt reports her oxygen machine had to be turned off at 4am and she was using the portable oxygen which is "not as good."  She reports this was when she began to feel SOB.  Nothing makes it better and movement makes both the CP and SOB worse.  Pt reports urinary urgency, frequency and dysuria with difficulty urinating on and off for a few days but worse today.  Pt also endorses generalized throbbing headache.  Pt denies fever, chills, headache, neck pain, abd pain, N/V/D, syncope.  Pt reports she has only been taking her Lasix x1 per day but missed Monday.  She reports she uses 245 as her dry weight, but has been gaining weight all week.  She did not weigh today.  She reports completion of menopause.  Pt reports she smokes 0.5 ppd.     Past Medical History  Diagnosis Date  . Coronary artery disease     S/P PCI of LAD with DES (12/2008). Total occlusion of RCA noted at that time., medically managed. ACS ruled out 03/2009 with Lexiscan myoview . Followed by [facility].  . Pulmonary hypertension     2-D Echo (25/4270) - Systolic pressure was moderately increased. PA peak pressure  25mHg. secondary pulm  htn likely on basis of comb of interstital lung disease, severe copd, small airways disease, severe sleep apnea and cor pulmonale,. Followed by Dr. WJoya Gaskins(Velora Heckler  . Diastolic dysfunction     2-D Echo (12/2008) - Normal LV Systolic funciton with EF 60-65%. Grade 1 diastolid dysfunction. No regional wall motion abnormalities. Moderate pulmonary HTN with PA peak pressure 532mg.  . Marland KitchenOPD (chronic obstructive pulmonary disease)     Severe. Gold Stage IV.  PFTs (12/2008) - severe obstructive airway disease. Active tobacco use. Requires 4L O2 at home.  . Pulmonary nodule, right     Small right middle lobe nodule. Stable as of 12/2008.  . Marland Kitchenrediabetes 12/2008    HgbA1c 6.4 (12/2008)  . Hx MRSA infection     Recurrent MRSA thigh abscesses.  . Tobacco abuse     Ongoing.  . Obesity   . Hyperlipidemia   . GERD (gastroesophageal reflux disease)     S/P Nissen fundoplication.  . CHF (congestive heart failure)   . Shortness of breath dyspnea    Past Surgical History  Procedure Laterality Date  . Total abdominal hysterectomy w/ bilateral salpingoophorectomy    . Nissen fundoplication    . Right heart catheterization N/A 11/09/2012    Procedure: RIGHT HEART CATH;  Surgeon: DaLarey DresserMD;  [facility];  Service: Cardiovascular;  Laterality: N/A;   Family History  Problem Relation Age of Onset  .  Heart disease Mother 45    Deceased from MI at 71yo  . Hypertension Mother   . Heart disease Father 69    Deceased of MI age 107yo  . Hypertension Father   . Hypertension Brother   . Lung cancer      Grandmother   History  Substance Use Topics  . Smoking status: Current Every Day Smoker -- 1.00 packs/day    Types: Cigarettes    Start date: 04/04/1969  . Smokeless tobacco: Never Used     Comment: 1/2 pack or less. 1 pack to .5 pack per day  . Alcohol Use: No   OB History    No data available     Review of Systems  Constitutional: Negative for fever, diaphoresis, appetite  change, fatigue and unexpected weight change.  HENT: Negative for mouth sores.   Eyes: Negative for visual disturbance.  Respiratory: Positive for shortness of breath. Negative for cough, chest tightness and wheezing.   Cardiovascular: Positive for chest pain and leg swelling.  Gastrointestinal: Negative for nausea, vomiting, abdominal pain, diarrhea and constipation.  Endocrine: Negative for polydipsia, polyphagia and polyuria.  Genitourinary: Positive for dysuria, urgency, frequency and difficulty urinating. Negative for hematuria.  Musculoskeletal: Negative for back pain and neck stiffness.  Skin: Negative for rash.  Allergic/Immunologic: Negative for immunocompromised state.  Neurological: Positive for headaches. Negative for syncope and light-headedness.  Hematological: Does not bruise/bleed easily.  Psychiatric/Behavioral: Negative for sleep disturbance. The patient is not nervous/anxious.       Allergies  Fluconazole and Atorvastatin  Home Medications   Prior to Admission medications   Medication Sig Start Date End Date Taking? Authorizing Provider  albuterol (PROVENTIL) (2.5 MG/3ML) 0.083% nebulizer solution Take 3 mLs (2.5 mg total) by nebulization every 6 (six) hours as needed for shortness of breath. 09/05/14  Yes Oval Linsey, MD  aspirin 81 MG chewable tablet Chew 1 tablet (81 mg total) by mouth daily. Patient taking differently: Chew 81 mg by mouth at bedtime.  05/27/14  Yes Norman Herrlich, MD  Aspirin-Salicylamide-Caffeine [facility] HEADACHE PO) Take 1 Package by mouth daily as needed (pain).   Yes Historical Provider, MD  Fluticasone-Salmeterol (ADVAIR DISKUS) 250-50 MCG/DOSE AEPB Inhale 1 puff into the lungs 2 (two) times daily. 09/05/14  Yes Tasrif Ahmed, MD  furosemide (LASIX) 40 MG tablet Take 1 tablet (40 mg total) by mouth 2 (two) times daily. 09/27/14  Yes Erick Colace, NP  lovastatin (MEVACOR) 40 MG tablet Take 1 tablet (40 mg total) by mouth at bedtime. 09/11/14 09/11/15  Yes Corky Sox, MD  pantoprazole (PROTONIX) 40 MG tablet TAKE 1 TABLET(40 MG) BY MOUTH DAILY 10/01/14  Yes Nischal Dareen Piano, MD  tiotropium (SPIRIVA HANDIHALER) 18 MCG inhalation capsule Place 1 capsule (18 mcg total) into inhaler and inhale daily. 09/05/14  Yes Tasrif Ahmed, MD  VENTOLIN HFA 108 (90 BASE) MCG/ACT inhaler Inhale 2 puffs into the lungs every 6 (six) hours as needed for shortness of breath.  09/05/14  Yes Historical Provider, MD  potassium chloride 20 MEQ TBCR Take 40 mEq by mouth daily. Patient not taking: Reported on 10/29/2014 10/09/14   Francesca Oman, DO   BP 141/78 mmHg  Pulse 84  Temp(Src) 98.4 F (36.9 C) (Oral)  Resp 23  Ht _0  (1.651 m)  Wt 247 lb 6.4 oz (112.22 kg)  BMI 41.17 kg/m2  SpO2 96% Physical Exam  Constitutional: She appears well-developed and well-nourished. No distress.  Awake, alert, nontoxic appearance  HENT:  Head: Normocephalic and atraumatic.  Mouth/Throat: Oropharynx is clear and moist. No oropharyngeal exudate.  Eyes: Conjunctivae are normal. No scleral icterus.  Neck: Normal range of motion. Neck supple.  Cardiovascular: Normal rate, regular rhythm, normal heart sounds and intact distal pulses.   Pulmonary/Chest: Effort normal. No respiratory distress. She has decreased breath sounds (throughout). She has no wheezes. She has rales (fine, bilateral bases).  Equal chest expansion  Abdominal: Soft. Bowel sounds are normal. She exhibits no mass. There is tenderness in the suprapubic area. There is no rebound and no guarding.  Mild tenderness to the suprapubic region Obese abd  Musculoskeletal: Normal range of motion. She exhibits no edema.  Neurological: She is alert.  Speech is clear and goal oriented Moves extremities without ataxia  Skin: Skin is warm and dry. She is not diaphoretic.  Erythema of the right lower leg, worst on the anterior with increased warmth  Psychiatric: She has a normal mood and affect.  Nursing note and vitals  reviewed.   ED Course  Procedures (including critical care time) Labs Review Labs Reviewed  CBC WITH DIFFERENTIAL/PLATELET - Abnormal; Notable for the following:    WBC 13.2 (*)    Neutro Abs 10.2 (*)    All other components within normal limits  COMPREHENSIVE METABOLIC PANEL - Abnormal; Notable for the following:    Sodium 133 (*)    Potassium 2.1 (*)    Chloride 82 (*)    CO2 37 (*)    Glucose, Bld 162 (*)    BUN <5 (*)    Calcium 8.5 (*)    Albumin 3.0 (*)    AST 14 (*)    ALT 8 (*)    GFR calc non Af Amer 60 (*)    All other components within normal limits  URINALYSIS, ROUTINE W REFLEX MICROSCOPIC (NOT AT [facility]) - Abnormal; Notable for the following:    Color, Urine AMBER (*)    APPearance CLOUDY (*)    Bilirubin Urine SMALL (*)    Ketones, ur 15 (*)    Leukocytes, UA TRACE (*)    All other components within normal limits  URINE MICROSCOPIC-ADD ON - Abnormal; Notable for the following:    Squamous Epithelial / LPF MANY (*)    Bacteria, UA FEW (*)    All other components within normal limits  BRAIN NATRIURETIC PEPTIDE  I-STAT TROPOININ, ED  I-STAT CG4 LACTIC ACID, ED  I-STAT CG4 LACTIC ACID, ED    Imaging Review Dg Chest 2 View  10/29/2014   CLINICAL DATA:  Shortness of breath and chest pain  EXAM: CHEST  2 VIEW  COMPARISON:  CT 09/02/2014  FINDINGS: Heart size is moderately enlarged. No pleural effusion or edema. No airspace consolidation. Left lower lobe lung mass is again noted measuring 3.9 cm and suspicious for malignancy.  IMPRESSION: 1. No acute findings. 2. Persistent left lower lobe lung mass worrisome for malignancy.   Electronically Signed   By: Kerby Moors M.D.   On: 10/29/2014 18:29   Ct Angio Chest Pe W/cm &/or Wo Cm  10/30/2014   CLINICAL DATA:  Dyspnea and chest pain, onset today  EXAM: CT ANGIOGRAPHY CHEST WITH CONTRAST  TECHNIQUE: Multidetector CT imaging of the chest was performed using the standard protocol during bolus administration of  intravenous contrast. Multiplanar CT image reconstructions and MIPs were obtained to evaluate the vascular anatomy.  CONTRAST:  143m OMNIPAQUE IOHEXOL 350 MG/ML SOLN  COMPARISON:  09/02/2014  FINDINGS: Cardiovascular: There is good opacification  of the pulmonary arteries. There is no pulmonary embolism. The thoracic aorta is normal in caliber and intact. There is mild enlargement of the central pulmonary arteries, raising the question of pulmonary arterial hypertension.  Lungs: The left lower lobe mass has enlarged, measuring 3.6 x 4.2 by 3.8 cm. and previously measuring 2.0 x 2.8 x 2.8 cm. Neoplasm cannot be excluded, although the rate of enlargement would be more compatible with an inflammatory lesion. No other masses are evident.  Central airways: Patent  Effusions: None  Lymphadenopathy: Moderately prominent left hilar nodes measuring 10 mm and 13 mm in short axis. These could be reactive but are not conclusively characterized. There are a few physiologic -appearing mediastinal nodes and axillary nodes.  Esophagus: Unremarkable.  Moderate hiatal hernia.  Upper abdomen: Hiatal hernia. Prior surgery around the GE junction and proximal stomach.  Musculoskeletal: No significant musculoskeletal lesion.  Review of the MIP images confirms the above findings.  IMPRESSION: 1. Negative for pulmonary embolism. 2. Mild enlargement of the central pulmonary arteries. This can be seen with pulmonary arterial hypertension. 3. 4 cm left lower lobe lung mass, enlarged from 09/02/2014 when it measured just under 3 cm. Neoplastic or inflammatory etiologies are both possible. There also are moderately prominent left hilar nodes. Consider PET-CT for characterization and staging.   Electronically Signed   By: Andreas Newport M.D.   On: 10/30/2014 00:50     EKG Interpretation   Date/Time:  Wednesday October 29 2014 17:40:12 EDT Ventricular Rate:  108 PR Interval:  137 QRS Duration: 100 QT Interval:  349 QTC Calculation:  468 R Axis:   118 Text Interpretation:  Sinus tachycardia Atrial premature complex Probable  RVH w/ secondary repol abnormality No significant change was found  Confirmed by CAMPOS  MD, Lennette Bihari ([id number]) on 10/29/2014 6:16:44 PM      MDM   Final diagnoses:  SOB (shortness of breath)  Chest pain  Hypokalemia  Tachycardia  Weakness  Mass of lower lobe of left lung   Michelene Gardener presents with CP and SOB onset this morning.  Pt with long Hx of noncompliance.  Endorses symptoms of UTI and right lower leg is clinically consistent with mild cellulitis.  Pt also with HA, but normal gross neurologic exam.  Will begin lab work-up.    Pt with noted hypotension on my exam. ? From nitro administration vs infection vs 3rd spacing of fluids  Will give judicious fluid bolus.  Pt HR hovering around 100.  94% on 4L via [place].  EKG is nonischemic.  11:50 PM Patient with significant hypokalemia.  Mild monocytosis at 13.4. No evidence of urinary tract infection. Lactic acid normal. Patient's blood pressure has improved after fluid bolus. Likely secondary to her nitroglycerin administration. She remains tachycardic. Her chest x-ray shows persistent left lung mass concerning for malignancy. This raises concern for possible PE. Will obtain CT AngioJet chest.  Patient's hypokalemia has begun replacement here in the emergency department but she will need admission for further replacement.  1:19 AM Pt without active chest pain and improved SOB.  CT scan with enlarging mass, but no PE.  Pt takes ASA daily, but is not anticoagulated.    Discussed with Internal Medicine who will admit.    The patient was discussed with and seen by Dr. Venora Maples who agrees with the treatment plan.   Jarrett Soho Prestina Raigoza, PA-C 10/30/14 [id number]  Jola Schmidt, MD 10/31/14 [phone]

## 2014-10-29 NOTE — ED Notes (Signed)
Pt arrives via EMS from home c/o SOB and CP starting today. Pt wears 4L [place] chonric. EMS reports that pts home was very hot and without AC. EMS gave 324 ASA and 1 NTG with no relief of pain.

## 2014-10-30 ENCOUNTER — Telehealth: Payer: Self-pay | Admitting: Internal Medicine

## 2014-10-30 ENCOUNTER — Other Ambulatory Visit: Payer: Self-pay | Admitting: Internal Medicine

## 2014-10-30 ENCOUNTER — Ambulatory Visit: Payer: Commercial Managed Care - HMO | Admitting: *Deleted

## 2014-10-30 DIAGNOSIS — I272 Other secondary pulmonary hypertension: Secondary | ICD-10-CM | POA: Diagnosis not present

## 2014-10-30 DIAGNOSIS — E876 Hypokalemia: Secondary | ICD-10-CM

## 2014-10-30 DIAGNOSIS — R300 Dysuria: Secondary | ICD-10-CM | POA: Diagnosis not present

## 2014-10-30 DIAGNOSIS — R918 Other nonspecific abnormal finding of lung field: Secondary | ICD-10-CM | POA: Diagnosis not present

## 2014-10-30 DIAGNOSIS — R0602 Shortness of breath: Secondary | ICD-10-CM | POA: Diagnosis not present

## 2014-10-30 DIAGNOSIS — R3915 Urgency of urination: Secondary | ICD-10-CM | POA: Diagnosis not present

## 2014-10-30 DIAGNOSIS — R51 Headache: Secondary | ICD-10-CM | POA: Diagnosis not present

## 2014-10-30 DIAGNOSIS — R0600 Dyspnea, unspecified: Secondary | ICD-10-CM | POA: Diagnosis present

## 2014-10-30 DIAGNOSIS — I7789 Other specified disorders of arteries and arterioles: Secondary | ICD-10-CM | POA: Diagnosis not present

## 2014-10-30 DIAGNOSIS — I5032 Chronic diastolic (congestive) heart failure: Secondary | ICD-10-CM | POA: Diagnosis not present

## 2014-10-30 DIAGNOSIS — I2510 Atherosclerotic heart disease of native coronary artery without angina pectoris: Secondary | ICD-10-CM | POA: Diagnosis not present

## 2014-10-30 DIAGNOSIS — R350 Frequency of micturition: Secondary | ICD-10-CM | POA: Diagnosis not present

## 2014-10-30 DIAGNOSIS — R079 Chest pain, unspecified: Secondary | ICD-10-CM | POA: Diagnosis not present

## 2014-10-30 LAB — BASIC METABOLIC PANEL
ANION GAP: 9 (ref 5–15)
BUN: <5 mg/dL — ABNORMAL LOW (ref 6–20)
CALCIUM: 7.9 mg/dL — AB (ref 8.9–10.3)
CO2: 35 mmol/L — AB (ref 22–32)
Chloride: 91 mmol/L — ABNORMAL LOW (ref 101–111)
Creatinine, Ser: 0.76 mg/dL (ref 0.44–1.00)
GFR calc non Af Amer: >60 mL/min (ref >60)
Glucose, Bld: 155 mg/dL — ABNORMAL HIGH (ref 65–99)
Potassium: 3 mmol/L — ABNORMAL LOW (ref 3.5–5.1)
Sodium: 135 mmol/L (ref 135–145)

## 2014-10-30 LAB — TROPONIN I: Troponin I: <0.03 ng/mL (ref <0.031)

## 2014-10-30 MED ORDER — PRAVASTATIN SODIUM 40 MG PO TABS: 40.0000 mg | ORAL_TABLET | Freq: Every day | ORAL | Status: DC

## 2014-10-30 MED ORDER — PANTOPRAZOLE SODIUM 40 MG PO TBEC: 40.0000 mg | DELAYED_RELEASE_TABLET | Freq: Every day | ORAL | Status: DC

## 2014-10-30 MED ORDER — MOMETASONE FURO-FORMOTEROL FUM 100-5 MCG/ACT IN AERO: 2.0000 | INHALATION_SPRAY | Freq: Two times a day (BID) | RESPIRATORY_TRACT | Status: DC

## 2014-10-30 MED ORDER — ASPIRIN 81 MG PO CHEW
81.0000 mg | CHEWABLE_TABLET | Freq: Every day | ORAL | Status: DC
Start: 2014-10-30 — End: 2014-10-30

## 2014-10-30 MED ORDER — POTASSIUM CHLORIDE ER 20 MEQ PO TBCR: 40.0000 meq | EXTENDED_RELEASE_TABLET | Freq: Every day | ORAL | Status: DC

## 2014-10-30 MED ORDER — IPRATROPIUM-ALBUTEROL 0.5-2.5 (3) MG/3ML IN SOLN: 3.0000 mL | Freq: Four times a day (QID) | RESPIRATORY_TRACT | Status: DC

## 2014-10-30 MED ORDER — TIOTROPIUM BROMIDE MONOHYDRATE 18 MCG IN CAPS: 18.0000 ug | ORAL_CAPSULE | Freq: Every day | RESPIRATORY_TRACT | Status: DC

## 2014-10-30 MED ORDER — ACETAMINOPHEN 325 MG PO TABS: 650.0000 mg | ORAL_TABLET | Freq: Four times a day (QID) | ORAL | Status: DC | PRN

## 2014-10-30 MED ORDER — ACETAMINOPHEN 650 MG RE SUPP: 650.0000 mg | Freq: Four times a day (QID) | RECTAL | Status: DC | PRN

## 2014-10-30 MED ORDER — ENOXAPARIN SODIUM 40 MG/0.4ML ~~LOC~~ SOLN: 40.0000 mg | SUBCUTANEOUS | Status: DC

## 2014-10-30 NOTE — Discharge Instructions (Signed)
Continue taking your medications as prescribed. You will be contacted for follow up with your doctor.  Return to the ER for worsening condition or new concerning symptoms.  It is very important to take your potassium tablets!!  The mass in your lung has gotten bigger.  This will need to be further evaluated by your doctor   Hypokalemia Hypokalemia means that the amount of potassium in the blood is lower than normal.Potassium is a chemical, called an electrolyte, that helps regulate the amount of fluid in the body. It also stimulates muscle contraction and helps nerves function properly.Most of the body's potassium is inside of cells, and only a very small amount is in the blood. Because the amount in the blood is so small, minor changes can be life-threatening. CAUSES  Antibiotics.  Diarrhea or vomiting.  Using laxatives too much, which can cause diarrhea.  Chronic kidney disease.  Water pills (diuretics).  Eating disorders (bulimia).  Low magnesium level.  Sweating a lot. SIGNS AND SYMPTOMS  Weakness.  Constipation.  Fatigue.  Muscle cramps.  Mental confusion.  Skipped heartbeats or irregular heartbeat (palpitations).  Tingling or numbness. DIAGNOSIS  Your health care provider can diagnose hypokalemia with blood tests. In addition to checking your potassium level, your health care provider may also check other lab tests. TREATMENT Hypokalemia can be treated with potassium supplements taken by mouth or adjustments in your current medicines. If your potassium level is very low, you may need to get potassium through a vein (IV) and be monitored in the hospital. A diet high in potassium is also helpful. Foods high in potassium are:  Nuts, such as peanuts and pistachios.  Seeds, such as sunflower seeds and pumpkin seeds.  Peas, lentils, and lima beans.  Whole grain and bran cereals and breads.  Fresh fruit and vegetables, such as apricots, avocado, bananas,  cantaloupe, kiwi, oranges, tomatoes, asparagus, and potatoes.  Orange and tomato juices.  Red meats.  Fruit yogurt. HOME CARE INSTRUCTIONS  Take all medicines as prescribed by your health care provider.  Maintain a healthy diet by including nutritious food, such as fruits, vegetables, nuts, whole grains, and lean meats.  If you are taking a laxative, be sure to follow the directions on the label. SEEK MEDICAL CARE IF:  Your weakness gets worse.  You feel your heart pounding or racing.  You are vomiting or having diarrhea.  You are diabetic and having trouble keeping your blood glucose in the normal range. SEEK IMMEDIATE MEDICAL CARE IF:  You have chest pain, shortness of breath, or dizziness.  You are vomiting or having diarrhea for more than 2 days.  You faint. MAKE SURE YOU:   Understand these instructions.  Will watch your condition.  Will get help right away if you are not doing well or get worse. Document Released: 03/21/2005 Document Revised: 01/09/2013 Document Reviewed: 09/21/2012 [facility] Patient Information 2015 [place], [place]. This information is not intended to replace advice given to you by your health care provider. Make sure you discuss any questions you have with your health care provider.  Potassium Content of Foods Potassium is a mineral found in many foods and drinks. It helps keep fluids and minerals balanced in your body and affects how steadily your heart beats. Potassium also helps control your blood pressure and keep your muscles and nervous system healthy. Certain health conditions and medicines may change the balance of potassium in your body. When this happens, you can help balance your level of potassium through the  foods that you do or do not eat. Your health care provider or dietitian may recommend an amount of potassium that you should have each day. The following lists of foods provide the amount of potassium (in parentheses) per serving in  each item. HIGH IN POTASSIUM  The following foods and beverages have 200 mg or more of potassium per serving:  Apricots, 2 raw or 5 dry (200 mg).  Artichoke, 1 medium (345 mg).  Avocado, raw,  each (245 mg).  Banana, 1 medium (425 mg).  Beans, lima, or baked beans, canned,  cup (280 mg).  Beans, white, canned,  cup (595 mg).  Beef roast, 3 oz (320 mg).  Beef, ground, 3 oz (270 mg).  Beets, raw or cooked,  cup (260 mg).  Bran muffin, 2 oz (300 mg).  Broccoli,  cup (230 mg).  Brussels sprouts,  cup (250 mg).  Cantaloupe,  cup (215 mg).  Cereal, 100% bran,  cup (200-400 mg).  Cheeseburger, single, fast food, 1 each (225-400 mg).  Chicken, 3 oz (220 mg).  Clams, canned, 3 oz (535 mg).  Crab, 3 oz (225 mg).  Dates, 5 each (270 mg).  Dried beans and peas,  cup (300-475 mg).  Figs, dried, 2 each (260 mg).  Fish: halibut, tuna, cod, snapper, 3 oz (480 mg).  Fish: salmon, haddock, swordfish, perch, 3 oz (300 mg).  Fish, tuna, canned 3 oz (200 mg).  [place] fries, fast food, 3 oz (470 mg).  Granola with fruit and nuts,  cup (200 mg).  Grapefruit juice,  cup (200 mg).  Greens, beet,  cup (655 mg).  Honeydew melon,  cup (200 mg).  Kale, raw, 1 cup (300 mg).  Kiwi, 1 medium (240 mg).  Kohlrabi, rutabaga, parsnips,  cup (280 mg).  Lentils,  cup (365 mg).  Mango, 1 each (325 mg).  Milk, chocolate, 1 cup (420 mg).  Milk: nonfat, low-fat, whole, buttermilk, 1 cup (350-380 mg).  Molasses, 1 Tbsp (295 mg).  Mushrooms,  cup (280) mg.  Nectarine, 1 each (275 mg).  Nuts: almonds, peanuts, hazelnuts, [place], cashew, mixed, 1 oz (200 mg).  Nuts, pistachios, 1 oz (295 mg).  Orange, 1 each (240 mg).  Orange juice,  cup (235 mg).  Papaya, medium,  fruit (390 mg).  Peanut butter, chunky, 2 Tbsp (240 mg).  Peanut butter, smooth, 2 Tbsp (210 mg).  Pear, 1 medium (200 mg).  Pomegranate, 1 whole (400 mg).  Pomegranate juice,  cup  (215 mg).  Pork, 3 oz (350 mg).  Potato chips, salted, 1 oz (465 mg).  Potato, baked with skin, 1 medium (925 mg).  Potatoes, boiled,  cup (255 mg).  Potatoes, mashed,  cup (330 mg).  Prune juice,  cup (370 mg).  Prunes, 5 each (305 mg).  Pudding, chocolate,  cup (230 mg).  Pumpkin, canned,  cup (250 mg).  Raisins, seedless,  cup (270 mg).  Seeds, sunflower or pumpkin, 1 oz (240 mg).  Soy milk, 1 cup (300 mg).  Spinach,  cup (420 mg).  Spinach, canned,  cup (370 mg).  Sweet potato, baked with skin, 1 medium (450 mg).  Swiss chard,  cup (480 mg).  Tomato or vegetable juice,  cup (275 mg).  Tomato sauce or puree,  cup (400-550 mg).  Tomato, raw, 1 medium (290 mg).  Tomatoes, canned,  cup (200-300 mg).  [place], 3 oz (250 mg).  Wheat germ, 1 oz (250 mg).  Winter squash,  cup (250 mg).  Yogurt, plain or  fruited, 6 oz (260-435 mg).  Zucchini,  cup (220 mg). MODERATE IN POTASSIUM The following foods and beverages have 50-200 mg of potassium per serving:  Apple, 1 each (150 mg).  Apple juice,  cup (150 mg).  Applesauce,  cup (90 mg).  Apricot nectar,  cup (140 mg).  Asparagus, small spears,  cup or 6 spears (155 mg).  Bagel, cinnamon raisin, 1 each (130 mg).  Bagel, egg or plain, 4 in., 1 each (70 mg).  Beans, green,  cup (90 mg).  Beans, yellow,  cup (190 mg).  Beer, regular, 12 oz (100 mg).  Beets, canned,  cup (125 mg).  Blackberries,  cup (115 mg).  Blueberries,  cup (60 mg).  Bread, whole wheat, 1 slice (70 mg).  Broccoli, raw,  cup (145 mg).  Cabbage,  cup (150 mg).  Carrots, cooked or raw,  cup (180 mg).  Cauliflower, raw,  cup (150 mg).  Celery, raw,  cup (155 mg).  Cereal, bran flakes, cup (120-150 mg).  Cheese, cottage,  cup (110 mg).  Cherries, 10 each (150 mg).  Chocolate, 1 oz bar (165 mg).  Coffee, brewed 6 oz (90 mg).  Corn,  cup or 1 ear (195 mg).  Cucumbers,  cup (80  mg).  Egg, large, 1 each (60 mg).  Eggplant,  cup (60 mg).  Endive, raw, cup (80 mg).  English muffin, 1 each (65 mg).  Fish, orange roughy, 3 oz (150 mg).  Frankfurter, beef or pork, 1 each (75 mg).  Fruit cocktail,  cup (115 mg).  Grape juice,  cup (170 mg).  Grapefruit,  fruit (175 mg).  Grapes,  cup (155 mg).  Greens: kale, turnip, collard,  cup (110-150 mg).  Ice cream or frozen yogurt, chocolate,  cup (175 mg).  Ice cream or frozen yogurt, vanilla,  cup (120-150 mg).  Lemons, limes, 1 each (80 mg).  Lettuce, all types, 1 cup (100 mg).  Mixed vegetables,  cup (150 mg).  Mushrooms, raw,  cup (110 mg).  Nuts: walnuts, pecans, or macadamia, 1 oz (125 mg).  Oatmeal,  cup (80 mg).  Okra,  cup (110 mg).  Onions, raw,  cup (120 mg).  Peach, 1 each (185 mg).  Peaches, canned,  cup (120 mg).  Pears, canned,  cup (120 mg).  Peas, green, frozen,  cup (90 mg).  Peppers, green,  cup (130 mg).  Peppers, red,  cup (160 mg).  Pineapple juice,  cup (165 mg).  Pineapple, fresh or canned,  cup (100 mg).  Plums, 1 each (105 mg).  Pudding, vanilla,  cup (150 mg).  Raspberries,  cup (90 mg).  Rhubarb,  cup (115 mg).  Rice, wild,  cup (80 mg).  Shrimp, 3 oz (155 mg).  Spinach, raw, 1 cup (170 mg).  Strawberries,  cup (125 mg).  Summer squash  cup (175-200 mg).  Swiss chard, raw, 1 cup (135 mg).  Tangerines, 1 each (140 mg).  Tea, brewed, 6 oz (65 mg).  Turnips,  cup (140 mg).  Watermelon,  cup (85 mg).  Wine, red, table, 5 oz (180 mg).  Wine, white, table, 5 oz (100 mg). LOW IN POTASSIUM The following foods and beverages have less than 50 mg of potassium per serving.  Bread, white, 1 slice (30 mg).  Carbonated beverages, 12 oz (less than 5 mg).  Cheese, 1 oz (20-30 mg).  Cranberries,  cup (45 mg).  Cranberry juice cocktail,  cup (20 mg).  Fats and oils, 1 Tbsp (less than  5 mg).  Hummus, 1 Tbsp (32  mg).  Nectar: papaya, mango, or pear,  cup (35 mg).  Rice, white or brown,  cup (50 mg).  Spaghetti or macaroni,  cup cooked (30 mg).  Tortilla, flour or corn, 1 each (50 mg).  Waffle, 4 in., 1 each (50 mg).  Water chestnuts,  cup (40 mg). Document Released: 11/02/2004 Document Revised: 03/26/2013 Document Reviewed: 02/15/2013 [facility] Patient Information 2015 [place], [place]. This information is not intended to replace advice given to you by your health care provider. Make sure you discuss any questions you have with your health care provider.

## 2014-10-30 NOTE — ED Provider Notes (Signed)
Patient initially seen by PA Muthersbaugh, with complaint of chest pain and shortness of breath.  Patient found to be hypokalemic.  Patient reports she doesn't like taking her potassium as it makes her feel bad.  Patient was to be admitted to internal medicine service due to persistent hyperkalemia.  Repeat potassium is improved.  Patient has chronic dyspnea which is improved, no signs of acute fluid overload.  Patient has worsening mass in her left lower lung which she has been advised that in the past.  Internal medicine has close follow-up, but does not feel that she meets criteria for admission.  Patient discharged to follow-up with internal medicine as scheduled.  Linton Flemings, MD 10/30/14 [phone]

## 2014-10-30 NOTE — Telephone Encounter (Signed)
  Reason for call:   I placed an outgoing call to [name] at 7:21PM on 7/28.  [name] called her this AM to make sure she was coming in today to have her potassium checked but she did not make it.  [name] said she was not able to get a ride to the [facility] today and she would try tomorrow.  I explained to her the importance of taking her potassium and she stated she was taking it although it made her nauseated.  She voiced understanding and said that she would continue to take her potassium.      Assessment/ Plan:   Hypokalemia:  Pt had hypokalemia of 2.1 on 7/28 in the ED that came up to 3.0 with IV and oral potassium.  Pt had not been taking potassium as instructed at home.  She was supposed to come in to the [facility] today to have it repeated but did not make it today due to transportation issues.  She stated she would cont to take her potassium and try to come in on 7/29 to have it checked.  I stressed the importance of getting her lab checked tomorrow and her continuing to take potassium.  I will also send a message to Dr. Bettina Gavia office since she has an appt with him on 8/1 to have her potassium checked then also.    As always, pt is advised that if symptoms worsen or new symptoms arise, they should go to an urgent care facility or to to ER for further evaluation.   Jones Bales, MD   10/30/2014, 7:21 PM

## 2014-10-30 NOTE — ED Notes (Signed)
Patient transported to CT 

## 2014-10-30 NOTE — ED Notes (Addendum)
Preparing for discharge. Awaiting dispo.

## 2014-11-03 ENCOUNTER — Ambulatory Visit (INDEPENDENT_AMBULATORY_CARE_PROVIDER_SITE_OTHER): Payer: Commercial Managed Care - HMO | Admitting: Critical Care Medicine

## 2014-11-03 ENCOUNTER — Encounter: Payer: Self-pay | Admitting: Critical Care Medicine

## 2014-11-03 ENCOUNTER — Other Ambulatory Visit (INDEPENDENT_AMBULATORY_CARE_PROVIDER_SITE_OTHER): Payer: Commercial Managed Care - HMO

## 2014-11-03 VITALS — BP 140/98 | HR 92 | Ht 65.0 in | Wt 251.0 lb

## 2014-11-03 DIAGNOSIS — I5032 Chronic diastolic (congestive) heart failure: Secondary | ICD-10-CM | POA: Diagnosis not present

## 2014-11-03 DIAGNOSIS — J449 Chronic obstructive pulmonary disease, unspecified: Secondary | ICD-10-CM

## 2014-11-03 DIAGNOSIS — R918 Other nonspecific abnormal finding of lung field: Secondary | ICD-10-CM | POA: Diagnosis not present

## 2014-11-03 DIAGNOSIS — E876 Hypokalemia: Secondary | ICD-10-CM | POA: Diagnosis not present

## 2014-11-03 DIAGNOSIS — R6889 Other general symptoms and signs: Secondary | ICD-10-CM | POA: Diagnosis not present

## 2014-11-03 LAB — BASIC METABOLIC PANEL
BUN: 4 mg/dL — ABNORMAL LOW (ref 6–23)
CO2: 35 mEq/L — ABNORMAL HIGH (ref 19–32)
CREATININE: 0.88 mg/dL (ref 0.40–1.20)
Calcium: 8.9 mg/dL (ref 8.4–10.5)
Chloride: 92 mEq/L — ABNORMAL LOW (ref 96–112)
GFR: 69.56 mL/min (ref >60.00)
Glucose, Bld: 109 mg/dL — ABNORMAL HIGH (ref 70–99)
Potassium: 3.6 mEq/L (ref 3.5–5.1)
Sodium: 139 mEq/L (ref 135–145)

## 2014-11-03 MED ORDER — ALPRAZOLAM 0.5 MG PO TABS: ORAL_TABLET | ORAL | Status: DC

## 2014-11-03 NOTE — Assessment & Plan Note (Signed)
Left lower lobe lung mass that initially was 1 x 2.8 cm in size that now has increased to 3 x 4 cm in size over the past several months on recent CT scan of chest This is malignancy until proven otherwise Plan Obtain PET scan Obtain needle biopsy of left lower lobe mass

## 2014-11-03 NOTE — Patient Instructions (Signed)
Increase lasix to 6m twice daily for 3 days then 436mtwice daily thereafter  Increase potassium to 4055mtwice daily for 3 days then 28m76maily thereafter Labs today: BMET A PET scan will be obtained A needle biopsy of the left lung mass will be obtained No other medication changes Return 6 weeks

## 2014-11-03 NOTE — Assessment & Plan Note (Signed)
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

## 2014-11-03 NOTE — Progress Notes (Signed)
Subjective:    Patient ID: [name], female    DOB: February 13, 1956, 59 y.o.   MRN: [id number]  HPI 11/03/2014 Chief Complaint  Patient presents with  . Hospitalization Follow-up    Here to follow up from when she was sent to the ED from our office back in June.    recently in [place] for copd chf exac.  Has enlarging LLL mass   Back in ED 7/27 for low potassium and given fluids.   CT angio for pe neg.  Pt was 244 now 255.  Not urinating as she was before.  Dyspnea is stable.  No real mucus. No chest pain on the left.  Does not have A/C in her home.  Has a fan.   Pt denies any significant sore throat, nasal congestion or excess secretions, fever, chills, sweats, unintended weight loss, pleurtic or exertional chest pain, orthopnea PND, or leg swelling Pt denies any increase in rescue therapy over baseline, denies waking up needing it or having any early am or nocturnal exacerbations of coughing/wheezing/or dyspnea. Pt also denies any obvious fluctuation in symptoms with  weather or environmental change or other alleviating or aggravating factors   Current Medications, Allergies, Complete Past Medical History, Past Surgical History, Family History, and Social History were reviewed in [facility] record per todays encounter:  11/03/2014     Review of Systems  Constitutional: Positive for fatigue.  HENT: Negative.  Negative for ear pain, postnasal drip, rhinorrhea, sinus pressure, sore throat, trouble swallowing and voice change.   Eyes: Negative.   Respiratory: Negative.  Negative for apnea, cough, choking, chest tightness, shortness of breath, wheezing and stridor.   Cardiovascular: Negative.  Negative for chest pain, palpitations and leg swelling.  Gastrointestinal: Negative.  Negative for nausea, vomiting, abdominal pain and abdominal distention.  Genitourinary: Negative.   Musculoskeletal: Negative.  Negative for myalgias and arthralgias.  Skin: Negative.  Negative  for rash.  Allergic/Immunologic: Negative.  Negative for environmental allergies and food allergies.  Neurological: Negative.  Negative for dizziness, syncope, weakness and headaches.  Hematological: Negative.  Negative for adenopathy. Does not bruise/bleed easily.  Psychiatric/Behavioral: Negative.  Negative for sleep disturbance and agitation. The patient is not nervous/anxious.        Objective:   Physical Exam Filed Vitals:   11/03/14 1038  BP: 140/98  Pulse: 92  Height: _0  (1.651 m)  Weight: 251 lb (113.853 kg)  SpO2: 97%    Gen: Pleasant, well-nourished, in no distress,  normal affect  ENT: No lesions,  mouth clear,  oropharynx clear, no postnasal drip  Neck: No JVD, no TMG, no carotid bruits  Lungs: No use of accessory muscles, no dullness to percussion, distant BS,  Cardiovascular: RRR, heart sounds normal, no murmur or gallops, no peripheral edema  Abdomen: soft and NT, no HSM,  BS normal  Musculoskeletal: No deformities, no cyanosis or clubbing  Neuro: alert, non focal  Skin: Warm, no lesions or rashes  No results found.        Assessment & Plan:  I personally reviewed all images and lab data in the [facility] system as well as any outside material available during this office visit and agree with the  radiology impressions.   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 Current dyspnea is exacerbated by the patient's diastolic heart failure Plan Continued inhaled medications and oxygen as prescribed  Lung mass Left lower lobe lung mass that initially was 1 x  2.8 cm in size that now has increased to 3 x 4 cm in size over the past several months on recent CT scan of chest This is malignancy until proven otherwise Plan Obtain PET scan Obtain needle biopsy of left lower lobe mass  Hypokalemia Hypokalemia from recent hospitalization and ER visit due to diuretic's Increase lasix to 6076mwice daily for 3 days then 98m5mice daily thereafter  Increase potassium to 98me66mice daily for 3 days then 98meq29mly thereafter Labs today: BMET    Drina Chasyaeen today for hospitalization follow-up.  Diagnoses and all orders for this visit:  Hypokalemia Orders: -     Basic Metabolic Panel (BMET); Future  Lung mass Orders: -     NM PET Image Initial (PI) Skull Base To Thigh; Future -     CT Biopsy; Future  Chronic diastolic CHF (congestive heart failure)  Severe chronic obstructive pulmonary disease  Other orders -     ALPRAZolam (XANAX) 0.5 MG tablet; Take 1 tablet prior to PET scan

## 2014-11-03 NOTE — Assessment & Plan Note (Signed)
Hypokalemia from recent hospitalization and ER visit due to diuretic's Increase lasix to 24m twice daily for 3 days then 428mtwice daily thereafter  Increase potassium to 4013mtwice daily for 3 days then 28m33maily thereafter Labs today: BMET

## 2014-11-03 NOTE — Assessment & Plan Note (Signed)
Patient with increased weight gain of 6 pounds over one week with history of chronic diastolic heart failure Plan Increase lasix to 47m twice daily for 3 days then 481mtwice daily thereafter  Increase potassium to 4069mtwice daily for 3 days then 67m65maily thereafter Labs today: BMET

## 2014-11-04 NOTE — Progress Notes (Signed)
Quick Note:  Called, spoke with pt. Discussed results and recs per Dr. Joya Gaskins. She verbalized understanding and voiced no further questions or concerns at this time. ______

## 2014-11-05 DIAGNOSIS — J449 Chronic obstructive pulmonary disease, unspecified: Secondary | ICD-10-CM | POA: Diagnosis not present

## 2014-11-05 DIAGNOSIS — J9600 Acute respiratory failure, unspecified whether with hypoxia or hypercapnia: Secondary | ICD-10-CM | POA: Diagnosis not present

## 2014-11-05 DIAGNOSIS — R0902 Hypoxemia: Secondary | ICD-10-CM | POA: Diagnosis not present

## 2014-11-12 ENCOUNTER — Ambulatory Visit (HOSPITAL_COMMUNITY)
Admission: RE | Admit: 2014-11-12 | Discharge: 2014-11-12 | Disposition: A | Discharge status: Home / Self Care | Payer: Commercial Managed Care - HMO | Source: Ambulatory Visit | Attending: Critical Care Medicine | Admitting: Critical Care Medicine

## 2014-11-12 DIAGNOSIS — I714 Abdominal aortic aneurysm, without rupture: Secondary | ICD-10-CM | POA: Diagnosis not present

## 2014-11-12 DIAGNOSIS — R918 Other nonspecific abnormal finding of lung field: Secondary | ICD-10-CM | POA: Diagnosis not present

## 2014-11-12 DIAGNOSIS — I700 Atherosclerosis of aorta: Secondary | ICD-10-CM | POA: Insufficient documentation

## 2014-11-12 DIAGNOSIS — R590 Localized enlarged lymph nodes: Secondary | ICD-10-CM | POA: Insufficient documentation

## 2014-11-12 DIAGNOSIS — I2510 Atherosclerotic heart disease of native coronary artery without angina pectoris: Secondary | ICD-10-CM | POA: Diagnosis not present

## 2014-11-12 LAB — GLUCOSE, CAPILLARY: GLUCOSE-CAPILLARY: 160 mg/dL — AB (ref 65–99)

## 2014-11-12 MED ORDER — FLUDEOXYGLUCOSE F - 18 (FDG) INJECTION
16.6000 | Freq: Once | INTRAVENOUS | Status: DC | PRN
  Administered 2014-11-12: 16.6 via INTRAVENOUS
  Filled 2014-11-12: qty 16.6

## 2014-11-17 ENCOUNTER — Other Ambulatory Visit: Payer: Self-pay | Admitting: Radiology

## 2014-11-18 ENCOUNTER — Ambulatory Visit (HOSPITAL_COMMUNITY)
Admission: RE | Admit: 2014-11-18 | Discharge: 2014-11-18 | Disposition: A | Discharge status: Home / Self Care | Payer: Commercial Managed Care - HMO | Source: Ambulatory Visit | Attending: Critical Care Medicine | Admitting: Critical Care Medicine

## 2014-11-18 ENCOUNTER — Ambulatory Visit (HOSPITAL_COMMUNITY)
Admission: RE | Admit: 2014-11-18 | Discharge: 2014-11-18 | Disposition: A | Discharge status: Home / Self Care | Payer: Commercial Managed Care - HMO | Source: Ambulatory Visit | Attending: Interventional Radiology | Admitting: Interventional Radiology

## 2014-11-18 ENCOUNTER — Encounter (HOSPITAL_COMMUNITY): Payer: Self-pay

## 2014-11-18 DIAGNOSIS — E785 Hyperlipidemia, unspecified: Secondary | ICD-10-CM | POA: Diagnosis not present

## 2014-11-18 DIAGNOSIS — Z79899 Other long term (current) drug therapy: Secondary | ICD-10-CM | POA: Insufficient documentation

## 2014-11-18 DIAGNOSIS — I509 Heart failure, unspecified: Secondary | ICD-10-CM | POA: Diagnosis not present

## 2014-11-18 DIAGNOSIS — C3432 Malignant neoplasm of lower lobe, left bronchus or lung: Secondary | ICD-10-CM | POA: Insufficient documentation

## 2014-11-18 DIAGNOSIS — Z9981 Dependence on supplemental oxygen: Secondary | ICD-10-CM | POA: Diagnosis not present

## 2014-11-18 DIAGNOSIS — J449 Chronic obstructive pulmonary disease, unspecified: Secondary | ICD-10-CM | POA: Diagnosis not present

## 2014-11-18 DIAGNOSIS — I272 Other secondary pulmonary hypertension: Secondary | ICD-10-CM | POA: Diagnosis not present

## 2014-11-18 DIAGNOSIS — Z9889 Other specified postprocedural states: Secondary | ICD-10-CM

## 2014-11-18 DIAGNOSIS — C3492 Malignant neoplasm of unspecified part of left bronchus or lung: Secondary | ICD-10-CM | POA: Diagnosis not present

## 2014-11-18 DIAGNOSIS — R918 Other nonspecific abnormal finding of lung field: Secondary | ICD-10-CM

## 2014-11-18 DIAGNOSIS — I2510 Atherosclerotic heart disease of native coronary artery without angina pectoris: Secondary | ICD-10-CM | POA: Diagnosis not present

## 2014-11-18 DIAGNOSIS — E669 Obesity, unspecified: Secondary | ICD-10-CM | POA: Insufficient documentation

## 2014-11-18 DIAGNOSIS — Z7982 Long term (current) use of aspirin: Secondary | ICD-10-CM | POA: Insufficient documentation

## 2014-11-18 DIAGNOSIS — F17210 Nicotine dependence, cigarettes, uncomplicated: Secondary | ICD-10-CM | POA: Diagnosis not present

## 2014-11-18 DIAGNOSIS — K219 Gastro-esophageal reflux disease without esophagitis: Secondary | ICD-10-CM | POA: Diagnosis not present

## 2014-11-18 DIAGNOSIS — R911 Solitary pulmonary nodule: Secondary | ICD-10-CM | POA: Diagnosis not present

## 2014-11-18 DIAGNOSIS — R222 Localized swelling, mass and lump, trunk: Secondary | ICD-10-CM | POA: Diagnosis not present

## 2014-11-18 LAB — PROTIME-INR
INR: 1.04 (ref 0.00–1.49)
PROTHROMBIN TIME: 13.8 s (ref 11.6–15.2)

## 2014-11-18 LAB — CBC
HEMATOCRIT: 41.9 % (ref 36.0–46.0)
HEMOGLOBIN: 13.6 g/dL (ref 12.0–15.0)
MCH: 32.5 pg (ref 26.0–34.0)
MCHC: 32.5 g/dL (ref 30.0–36.0)
MCV: 100 fL (ref 78.0–100.0)
Platelets: 231 10*3/uL (ref 150–400)
RBC: 4.19 MIL/uL (ref 3.87–5.11)
RDW: 14.7 % (ref 11.5–15.5)
WBC: 10.1 10*3/uL (ref 4.0–10.5)

## 2014-11-18 LAB — APTT: APTT: 27 s (ref 24–37)

## 2014-11-18 MED ORDER — MIDAZOLAM HCL 2 MG/2ML IJ SOLN
INTRAMUSCULAR | Status: AC | PRN
  Administered 2014-11-18: 0.5 mg via INTRAVENOUS

## 2014-11-18 MED ORDER — FENTANYL CITRATE (PF) 100 MCG/2ML IJ SOLN
INTRAMUSCULAR | Status: AC
  Filled 2014-11-18: qty 2

## 2014-11-18 MED ORDER — FENTANYL CITRATE (PF) 100 MCG/2ML IJ SOLN
INTRAMUSCULAR | Status: AC | PRN
  Administered 2014-11-18: 25 ug via INTRAVENOUS

## 2014-11-18 MED ORDER — HYDROCODONE-ACETAMINOPHEN 5-325 MG PO TABS
1.0000 | ORAL_TABLET | ORAL | Status: DC | PRN
  Filled 2014-11-18: qty 2

## 2014-11-18 MED ORDER — MIDAZOLAM HCL 2 MG/2ML IJ SOLN
INTRAMUSCULAR | Status: AC
  Filled 2014-11-18: qty 2

## 2014-11-18 MED ORDER — SODIUM CHLORIDE 0.9 % IV SOLN
INTRAVENOUS | Status: DC
  Administered 2014-11-18: 07:00:00 via INTRAVENOUS

## 2014-11-18 NOTE — Progress Notes (Signed)
Pt states she had a coughing spell and spit up some blood streaked mucousy spit.  Sputum was visualized in napkin.  Pt states she is not having SOB, her VS are stable.  Dr. Vernard Gambles was notified and states some blood steaking is normal with a lung biopsy and pt should be fine as long as there are no other symptoms, like SOB.  Pt's post biopsy xray was also good per Dr. Vernard Gambles.  Pt was informed of all this and gave pt the okay to go ahead and eat her sandwich.

## 2014-11-18 NOTE — Discharge Instructions (Signed)
Needle Biopsy of Lung, Care After Refer to this sheet in the next few weeks. These instructions provide you with information on caring for yourself after your procedure. Your health care provider may also give you more specific instructions. Your treatment has been planned according to current medical practices, but problems sometimes occur. Call your health care provider if you have any problems or questions after your procedure. WHAT TO EXPECT AFTER THE PROCEDURE  A bandage will be applied over the area where the needle was inserted. You may be asked to apply pressure to the bandage for several minutes to ensure there is minimal bleeding.  In most cases, you can leave when your needle biopsy procedure is completed. Do not drive yourself home. Someone else should take you home.  If you received an IV sedative or general anesthetic, you will be taken to a comfortable place to relax while the medicine wears off.  If you have upcoming travel scheduled, talk to your health care provider about when it is safe to travel by air after the procedure. HOME CARE INSTRUCTIONS  Expect to take it easy for the rest of the day.  Protect the area where you received the needle biopsy by keeping the bandage in place for as long as instructed.  You may feel some mild pain or discomfort in the area, but this should stop in a day or two.  Take medicines only as directed by your health care provider. SEEK MEDICAL CARE IF:   You have pain at the biopsy site that worsens or is not helped by medicine.  You have swelling or drainage at the needle biopsy site.  You have a fever. SEEK IMMEDIATE MEDICAL CARE IF:   You have new or worsening shortness of breath.  You have chest pain.  You are coughing up blood.  You have bleeding that does not stop with pressure or a bandage.  You develop light-headedness or fainting. Document Released: 01/16/2007 Document Revised: 08/05/2013 Document Reviewed:  08/13/2012 [facility] Patient Information 2015 [place], [place]. This information is not intended to replace advice given to you by your health care provider. Make sure you discuss any questions you have with your health care provider. Lung Biopsy A lung biopsy is a procedure in which a tissue sample is removed from the lung. The tissue can be examined under a microscope to help diagnose various lung disorders.  LET [facility] CARE PROVIDER KNOW ABOUT:  Any allergies you have.  All medicines you are taking, including vitamins, herbs, eye drops, creams, and over-the-counter medicines.  Previous problems you or members of your family have had with the use of anesthetics.  Any blood disorders or bleeding problems that you have.  Previous surgeries you have had.  Medical conditions you have. RISKS AND COMPLICATIONS Generally, a lung biopsy is a safe procedure. However, problems can occur and include:  Collapse of the lung.   Bleeding.   Infection.  BEFORE THE PROCEDURE  Do not eat or drink anything after midnight on the night before the procedure or as directed by your health care provider.  Ask your health care provider about changing or stopping your regular medicines. This is especially important if you are taking diabetes medicines or blood thinners.  Plan to have someone take you home after the procedure. PROCEDURE Various methods can be used to perform a lung biopsy:   Needle biopsy. A biopsy needle is inserted into the lung. The needle is used to collect the tissue sample. A CT  scanner may be used to guide the needle to the right place in the lung. For this method, a medicine is used to numb the area where the biopsy sample will be taken (local anesthetic).  Bronchoscopy. A flexible tube (bronchoscope) is inserted into your lungs by going through your mouth or nose. A needle or forceps is passed through the bronchoscope to remove the tissue sample. For this method, medicine  may be used to numb the back of your throat.  Open biopsy. A cut (incision) is made in your chest. The tissue sample is then removed using surgical tools. The incision is closed with skin glue, skin adhesive strips, or stitches. For this method, you will be given medicine to make you sleep through the procedure (general anesthetic). AFTER THE PROCEDURE  Your recovery will be assessed and monitored.  You might have soreness and tenderness at the site of the biopsy for a few days after the procedure.  You might have a cough and some soreness in your throat for a few days if a bronchoscope was used. Document Released: 06/09/2004 Document Revised: 08/05/2013 Document Reviewed: 09/02/2012 [facility] Patient Information 2015 [place], [place]. This information is not intended to replace advice given to you by your health care provider. Make sure you discuss any questions you have with your health care pConscious Sedation, Adult, Care After Refer to this sheet in the next few weeks. These instructions provide you with information on caring for yourself after your procedure. Your health care provider may also give you more specific instructions. Your treatment has been planned according to current medical practices, but problems sometimes occur. Call your health care provider if you have any problems or questions after your procedure. WHAT TO EXPECT AFTER THE PROCEDURE  After your procedure:  You may feel sleepy, clumsy, and have poor balance for several hours.  Vomiting may occur if you eat too soon after the procedure. HOME CARE INSTRUCTIONS  Do not participate in any activities where you could become injured for at least 24 hours. Do not:  Drive.  Swim.  Ride a bicycle.  Operate heavy machinery.  Cook.  Use power tools.  Climb ladders.  Work from a high place.  Do not make important decisions or sign legal documents until you are improved.  If you vomit, drink water, juice, or soup  when you can drink without vomiting. Make sure you have little or no nausea before eating solid foods.  Only take over-the-counter or prescription medicines for pain, discomfort, or fever as directed by your health care provider.  Make sure you and your family fully understand everything about the medicines given to you, including what side effects may occur.  You should not drink alcohol, take sleeping pills, or take medicines that cause drowsiness for at least 24 hours.  If you smoke, do not smoke without supervision.  If you are feeling better, you may resume normal activities 24 hours after you were sedated.  Keep all appointments with your health care provider. SEEK MEDICAL CARE IF:  Your skin is pale or bluish in color.  You continue to feel nauseous or vomit.  Your pain is getting worse and is not helped by medicine.  You have bleeding or swelling.  You are still sleepy or feeling clumsy after 24 hours. SEEK IMMEDIATE MEDICAL CARE IF:  You develop a rash.  You have difficulty breathing.  You develop any type of allergic problem.  You have a fever. MAKE SURE YOU:  Understand these instructions.  Will watch your condition.  Will get help right away if you are not doing well or get worse. Document Released: 01/09/2013 Document Reviewed: 01/09/2013 [facility] Patient Information 2015 [place], [place]. This information is not intended to replace advice given to you by your health care provider. Make sure you discuss any questions you have with your health care provider.

## 2014-11-18 NOTE — Procedures (Signed)
CT core biopsy L lung nodule 18g x3 to surg path No complication No blood loss. See complete dictation in [facility].

## 2014-11-18 NOTE — H&P (Signed)
Chief Complaint: Patient was seen in consultation today for left lung mass at the request of Broxton E  Referring Physician(s): Wright,Patrick E  History of Present Illness: [name] is a 59 y.o. female   Pt with Hx COPD CTA 08/2014 for SOB was neg for PE and did show LLL mass. New CT 10/2014 revealed enlarging mass +PET 11/12/14: IMPRESSION: 1. 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 2. Mildly FDG avid left infrahilar node, possibly metastatic. Otherwise no evidence of hypermetabolic metastatic disease.  Now scheduled for LLL mass biopsy per Dr Joya Gaskins + smoker  Past Medical History  Diagnosis Date  . Coronary artery disease     S/P PCI of LAD with DES (12/2008). Total occlusion of RCA noted at that time., medically managed. ACS ruled out 03/2009 with Lexiscan myoview . Followed by [facility].  . Pulmonary hypertension     2-D Echo (07/5407) - Systolic pressure was moderately increased. PA peak pressure  49mHg. 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 Followed by Dr. WJoya Gaskins([facility] Heckler  . Diastolic dysfunction     2-D Echo (12/2008) - Normal LV Systolic funciton with EF 60-65%. Grade 1 diastolid dysfunction. No regional wall motion abnormalities. Moderate pulmonary HTN with PA peak pressure 520mg.  . Marland KitchenOPD (chronic obstructive pulmonary disease)     Severe. Gold Stage IV.  PFTs (12/2008) - severe obstructive airway disease. Active tobacco use. Requires 4L O2 at home.  . Pulmonary nodule, right     Small right middle lobe nodule. Stable as of 12/2008.  . Marland Kitchenrediabetes 12/2008    HgbA1c 6.4 (12/2008)  . Hx MRSA infection     Recurrent MRSA thigh abscesses.  . Tobacco abuse     Ongoing.  . Obesity   . Hyperlipidemia   . GERD (gastroesophageal reflux disease)     S/P Nissen  fundoplication.  . CHF (congestive heart failure)   . Shortness of breath dyspnea     Past Surgical History  Procedure Laterality Date  . Total abdominal hysterectomy w/ bilateral salpingoophorectomy    . Nissen fundoplication    . Right heart catheterization N/A 11/09/2012    Procedure: RIGHT HEART CATH;  Surgeon: DaLarey DresserMD;  [facility];  Service: Cardiovascular;  Laterality: N/A;    Allergies: Fluconazole and Atorvastatin  Medications: Prior to Admission medications   Medication Sig Start Date End Date Taking? Authorizing Provider  albuterol (PROVENTIL) (2.5 MG/3ML) 0.083% nebulizer solution Take 3 mLs (2.5 mg total) by nebulization every 6 (six) hours as needed for shortness of breath. 09/05/14  Yes LaOval LinseyMD  ALPRAZolam (XDuanne Moron0.5 MG tablet Take 1 tablet prior to PET scan 11/03/14  Yes PaElsie StainMD  Aspirin-Salicylamide-Caffeine ([facility] Hospital[facility]ADACHE PO) Take 1 Package by mouth daily as needed (pain).   Yes Historical Provider, MD  Fluticasone-Salmeterol (ADVAIR DISKUS) 250-50 MCG/DOSE AEPB Inhale 1 puff into the lungs 2 (two) times daily. 09/05/14  Yes Tasrif Ahmed, MD  furosemide (LASIX) 40 MG tablet Take 1 tablet (40 mg total) by mouth 2 (two) times daily. 09/27/14  Yes PeErick ColaceNP  lovastatin (MEVACOR) 40 MG tablet Take 1 tablet (40 mg total) by mouth at bedtime. 09/11/14 09/11/15 Yes EdCorky SoxMD  pantoprazole (PROTONIX) 40 MG tablet TAKE 1 TABLET(40 MG) BY MOUTH DAILY 10/01/14  Yes  Aldine Contes, MD  Potassium Chloride ER 20 MEQ TBCR Take 40 mEq by mouth daily. Patient taking differently: Take 20 mEq by mouth 2 (two) times daily.  10/30/14  Yes Linton Flemings, MD  tiotropium (SPIRIVA HANDIHALER) 18 MCG inhalation capsule Place 1 capsule (18 mcg total) into inhaler and inhale daily. 09/05/14  Yes Tasrif Ahmed, MD  VENTOLIN HFA 108 (90 BASE) MCG/ACT inhaler Inhale 2 puffs into the lungs every 6 (six) hours as needed for shortness of breath.  09/05/14  Yes  Historical Provider, MD  aspirin 81 MG chewable tablet Chew 1 tablet (81 mg total) by mouth daily. Patient taking differently: Chew 81 mg by mouth at bedtime.  05/27/14   Norman Herrlich, MD     Family History  Problem Relation Age of Onset  . Heart disease Mother 12    Deceased from MI at 19yo  . Hypertension Mother   . Heart disease Father 36    Deceased of MI age 68yo  . Hypertension Father   . Hypertension Brother   . Lung cancer      Grandmother    Social History   Social History  . Marital Status: Single    Spouse Name: N/A  . Number of Children: N/A  . Years of Education: N/A   Social History Main Topics  . Smoking status: Current Every Day Smoker -- 1.00 packs/day    Types: Cigarettes    Start date: 04/04/1969  . Smokeless tobacco: Never Used     Comment: 1/2 pack or less. 1 pack to .5 pack per day  . Alcohol Use: No  . Drug Use: No  . Sexual Activity: No   Other Topics Concern  . None   Social History Narrative   Formerly worked as a Scientist, water quality, now disabled.   Divorced.   2 grown children.   Lives with her grandson.     Review of Systems: A 12 point ROS discussed and pertinent positives are indicated in the HPI above.  All other systems are negative.  Review of Systems  Constitutional: Negative for fever and activity change.  Respiratory: Positive for shortness of breath.   Cardiovascular: Negative for chest pain.  Musculoskeletal: Negative for back pain.  Neurological: Negative for weakness.  Psychiatric/Behavioral: Negative for behavioral problems and confusion.    Vital Signs: BP 113/52 mmHg  Pulse 101  Temp(Src) 99.4 F (37.4 C) (Oral)  Resp 22  Ht _0  (1.651 m)  Wt 251 lb (113.853 kg)  BMI 41.77 kg/m2  SpO2 93%  Physical Exam  Constitutional: She is oriented to person, place, and time.  Cardiovascular: Normal rate, regular rhythm and normal heart sounds.   No murmur heard. Pulmonary/Chest: Effort normal and breath sounds normal. She  has no wheezes.  Abdominal: Soft. Bowel sounds are normal. There is no tenderness.  Musculoskeletal: Normal range of motion.  Neurological: She is alert and oriented to person, place, and time.  Skin: Skin is warm and dry.  Psychiatric: She has a normal mood and affect. Her behavior is normal. Judgment and thought content normal.  Nursing note and vitals reviewed.   Mallampati Score:  MD Evaluation Airway: WNL Heart: WNL Abdomen: WNL Chest/ Lungs: WNL ASA  Classification: 3 Mallampati/Airway Score: One  Imaging: Dg Chest 2 View  10/29/2014   CLINICAL DATA:  Shortness of breath and chest pain  EXAM: CHEST  2 VIEW  COMPARISON:  CT 09/02/2014  FINDINGS: Heart size is moderately enlarged. No pleural effusion or edema.  No airspace consolidation. Left lower lobe lung mass is again noted measuring 3.9 cm and suspicious for malignancy.  IMPRESSION: 1. No acute findings. 2. Persistent left lower lobe lung mass worrisome for malignancy.   Electronically Signed   By: Kerby Moors M.D.   On: 10/29/2014 18:29   Ct Angio Chest Pe W/cm &/or Wo Cm  10/30/2014   CLINICAL DATA:  Dyspnea and chest pain, onset today  EXAM: CT ANGIOGRAPHY CHEST WITH CONTRAST  TECHNIQUE: Multidetector CT imaging of the chest was performed using the standard protocol during bolus administration of intravenous contrast. Multiplanar CT image reconstructions and MIPs were obtained to evaluate the vascular anatomy.  CONTRAST:  160m OMNIPAQUE IOHEXOL 350 MG/ML SOLN  COMPARISON:  09/02/2014  FINDINGS: Cardiovascular: There is good opacification of the pulmonary arteries. There is no pulmonary embolism. The thoracic aorta is normal in caliber and intact. There is mild enlargement of the central pulmonary arteries, raising the question of pulmonary arterial hypertension.  Lungs: The left lower lobe mass has enlarged, measuring 3.6 x 4.2 by 3.8 cm. and previously measuring 2.0 x 2.8 x 2.8 cm. Neoplasm cannot be excluded, although the  rate of enlargement would be more compatible with an inflammatory lesion. No other masses are evident.  Central airways: Patent  Effusions: None  Lymphadenopathy: Moderately prominent left hilar nodes measuring 10 mm and 13 mm in short axis. These could be reactive but are not conclusively characterized. There are a few physiologic -appearing mediastinal nodes and axillary nodes.  Esophagus: Unremarkable.  Moderate hiatal hernia.  Upper abdomen: Hiatal hernia. Prior surgery around the GE junction and proximal stomach.  Musculoskeletal: No significant musculoskeletal lesion.  Review of the MIP images confirms the above findings.  IMPRESSION: 1. Negative for pulmonary embolism. 2. Mild enlargement of the central pulmonary arteries. This can be seen with pulmonary arterial hypertension. 3. 4 cm left lower lobe lung mass, enlarged from 09/02/2014 when it measured just under 3 cm. Neoplastic or inflammatory etiologies are both possible. There also are moderately prominent left hilar nodes. Consider PET-CT for characterization and staging.   Electronically Signed   By: DAndreas NewportM.D.   On: 10/30/2014 00:50   Nm Pet Image Initial (pi) Skull Base To Thigh  11/12/2014   CLINICAL DATA:  Initial treatment strategy for left lower lobe lung mass.  EXAM: NUCLEAR MEDICINE PET SKULL BASE TO THIGH  TECHNIQUE: 16.6 mCi F-18 FDG was injected intravenously. Full-ring PET imaging was performed from the skull base to thigh after the radiotracer. CT data was obtained and used for attenuation correction and anatomic localization.  FASTING BLOOD GLUCOSE:  Value: 160 mg/dl  COMPARISON:  Most recent chest CT from 10/30/2014.  FINDINGS: NECK  No hypermetabolic lymph nodes in the neck.  CHEST  There is an intensely hypermetabolic 5.3 x 4.6 cm left lower lobe lung mass (series 6/image 50) with max SUV 25.0, which demonstrates irregular margins, interval increased size from 4.2 x 4.0 cm on 10/30/2014, in keeping with a primary  bronchogenic carcinoma. This lung mass abuts the visceral pleural surface, with no definite chest wall invasion.  There are no additional significant pulmonary nodules or lung masses. No acute consolidative airspace disease. Mild scarring versus atelectasis in the right middle lobe, lingula and left lower lobe.  There is a mildly FDG avid mildly enlarged left infrahilar node measuring 1.0 cm short axis (series 4/image 74) with max SUV 3.2. There are no additional enlarged or hypermetabolic hilar nodes.  No hypermetabolic or enlarged  mediastinal nodes. A 0.8 cm short axis right paratracheal node (4/52) is unchanged in size since 09/07/2010 chest CT and demonstrates a max SUV of 2.8, equal to the mediastinal blood pool activity.  There is no pleural effusion or pneumothorax. There is mild cardiomegaly. There is atherosclerosis of the thoracic aorta, the great vessels of the mediastinum and the coronary arteries, including calcified atherosclerotic plaque in the left anterior descending, left circumflex and right coronary arteries. The main pulmonary artery is dilated (3.4 cm diameter), unchanged.  ABDOMEN/PELVIS  No abnormal hypermetabolic activity within the liver, pancreas, adrenal glands, or spleen. No hypermetabolic lymph nodes in the abdomen or pelvis. Surgical clips are again noted surrounding the proximal stomach. Infrarenal 3.2 cm abdominal aortic aneurysm. Status post hysterectomy, with no abnormal findings at the vaginal cuff. No adnexal abnormality.  SKELETON  No focal hypermetabolic activity to suggest skeletal metastasis.  IMPRESSION: 1. 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 2. Mildly FDG avid left infrahilar node, possibly metastatic. Otherwise no evidence of hypermetabolic metastatic disease. 3. Atherosclerosis, including three-vessel coronary artery disease. Please note that although the presence of coronary artery  calcium documents the presence of coronary artery disease, the severity of this disease and any potential stenosis cannot be assessed on this non-gated CT examination. Assessment for potential risk factor modification, dietary therapy or pharmacologic therapy may be warranted, if clinically indicated. 4. Infrarenal 3.2 cm abdominal aortic aneurysm. Recommend followup by ultrasound in 3 years. This recommendation follows ACR consensus guidelines: White Paper of the ACR Incidental Findings Committee II on Vascular Findings. Natasha Mead Coll Radiol 2013; 10:789-794   Electronically Signed   By: Ilona Sorrel M.D.   On: 11/12/2014 15:08    Labs:  CBC:  Recent Labs  09/24/14 1300 09/25/14 0027 10/29/14 1844 11/18/14 0705  WBC 7.4 6.7 13.2* 10.1  HGB 13.5 14.1 14.2 13.6  HCT 42.8 46.1* 41.9 41.9  PLT 149* 154 227 231    COAGS:  Recent Labs  09/24/14 1300 11/18/14 0705  INR 1.05 1.04  APTT  --  27    BMP:  Recent Labs  09/27/14 0315 10/08/14 1031 10/29/14 1844 10/30/14 0139 11/03/14 1120  NA 140 143 133* 135 139  K 4.4 3.1* 2.1* 3.0* 3.6  CL 85* 94* 82* 91* 92*  CO2 45* 36* 37* 35* 35*  GLUCOSE 145* 155* 162* 155* 109*  BUN 8 9 <5* <5* 4*  CALCIUM 9.2 9.4 8.5* 7.9* 8.9  CREATININE 0.53 0.87 1.00 0.76 0.88  GFRNONAA >60 73 60* >60  --   GFRAA >60 84 >60 >60  --     LIVER FUNCTION TESTS:  Recent Labs  05/22/14 1849 09/03/14 0540 09/24/14 1300 10/29/14 1844  BILITOT 1.0 0.5 0.5 1.0  AST 10 8* 12* 14*  ALT 8 11* 8* 8*  ALKPHOS 103 92 96 116  PROT 5.8* 7.1 6.7 6.6  ALBUMIN 3.0* 2.7* 2.9* 3.0*    TUMOR MARKERS: No results for input(s): AFPTM, CEA, CA199, CHROMGRNA in the last 8760 hours.  Assessment and Plan:  Enlarging left lung mass +PET Now for bx of same Risks and Benefits discussed with the patient including, but not limited to bleeding, infection, damage to adjacent structures or low yield requiring additional tests. All of the patient's questions were  answered, patient is agreeable to proceed. Consent signed and in chart.   Thank you for this interesting consult.  I greatly enjoyed meeting Nayomi  B U[place] and look forward to participating in their care.  A copy of this report was sent to the requesting provider on this date.  Signed: Adriell Polansky A 11/18/2014, 8:18 AM   I spent a total of  30 Minutes   in face to face in clinical consultation, greater than 50% of which was counseling/coordinating care for left lung mass bx

## 2014-11-20 ENCOUNTER — Telehealth: Payer: Self-pay | Admitting: Internal Medicine

## 2014-11-20 NOTE — Telephone Encounter (Signed)
Calling to give the dr some information about patient. She has not had a hospitalization in over 60 days she does not follow her diet doesn't not weigh daily last weight was 247 lbs. Pt does smoke but has cut back to half pack per day. Wearing oxygen continuously except when smoke has lung biopsy 8/16 and has follow up with the dr Joya Gaskins 9/14

## 2014-11-20 NOTE — Telephone Encounter (Signed)
So noted 

## 2014-11-24 ENCOUNTER — Encounter: Payer: Self-pay | Admitting: *Deleted

## 2014-11-25 ENCOUNTER — Telehealth: Payer: Self-pay | Admitting: Critical Care Medicine

## 2014-11-25 DIAGNOSIS — C801 Malignant (primary) neoplasm, unspecified: Secondary | ICD-10-CM

## 2014-11-25 DIAGNOSIS — R918 Other nonspecific abnormal finding of lung field: Secondary | ICD-10-CM

## 2014-11-25 NOTE — Telephone Encounter (Signed)
Spoke with the pt  She is requesting results of bx  I advised will forward msg to PW to check on this  Please advise, thanks!

## 2014-11-26 ENCOUNTER — Telehealth: Payer: Self-pay | Admitting: *Deleted

## 2014-11-26 DIAGNOSIS — R918 Other nonspecific abnormal finding of lung field: Secondary | ICD-10-CM

## 2014-11-26 NOTE — Telephone Encounter (Signed)
Called, spoke with pt.  Discussed below.  Pt verbalized understanding and voiced no further questions or concerns at this time.  She is aware [facility] will be calling her for an appt and will call back if she doesn't receive a call within the next few days.

## 2014-11-26 NOTE — Telephone Encounter (Signed)
We have to send a staff message to [place] ([place]) as well as Norton Blizzard, Nurse Navigator for [facility] for all new patient appointments.  This was marked as high priority and subject was ASAP Referral.  They contact the patient with the appt.  I will keep check on it but they usually staff message [place] once the patient has been scheduled to let us know.

## 2014-11-26 NOTE — Telephone Encounter (Signed)
Pt needs [facility] referral for adenocA Pt knows dx  Bx pos adenocarcinoma Needs med and rad onc to see  Not a surgical candidate

## 2014-11-26 NOTE — Telephone Encounter (Signed)
[name]

## 2014-11-26 NOTE — Telephone Encounter (Signed)
Oncology Nurse Navigator Documentation  Oncology Nurse Navigator Flowsheets 11/26/2014  Navigator Encounter Type Introductory phone call/I received a referral from Dr. Bettina Gavia office.  I called patient to schedule for thoracic clinic on 12/04/14 arrive at 2:30.  Patient verbalized understanding of appt time and place.    Treatment Phase Abnormal Scans  Interventions Coordination of Care  Coordination of Care MD Appointments  Time Spent with Patient 15

## 2014-11-26 NOTE — Telephone Encounter (Signed)
[facility], referral placed for [place].  Please ensure this is scheduled ASAP per Dr. Joya Gaskins.  Thank you!

## 2014-12-01 ENCOUNTER — Telehealth: Payer: Self-pay | Admitting: *Deleted

## 2014-12-01 ENCOUNTER — Other Ambulatory Visit: Payer: Self-pay | Admitting: *Deleted

## 2014-12-01 DIAGNOSIS — R918 Other nonspecific abnormal finding of lung field: Secondary | ICD-10-CM

## 2014-12-01 NOTE — Telephone Encounter (Signed)
Oncology Nurse Navigator Documentation  Oncology Nurse Navigator Flowsheets 12/01/2014  Navigator Encounter Type Telephone/per Dr. Darcey Nora, I ordered PFT's and scheduled before thoracic clinic on Thursday.  I called [facility] resp care ans received a time and date. I then called patient and gave per time and date and pre procedure instructions.  No breathing medications, no smoking and no caffeine.  She verbalized understanding of appt time and place.   Treatment Phase Abnormal Scans  Interventions Coordination of Care  Coordination of Care Other  Time Spent with Patient 30

## 2014-12-02 ENCOUNTER — Ambulatory Visit (HOSPITAL_COMMUNITY)
Admission: RE | Admit: 2014-12-02 | Discharge: 2014-12-02 | Disposition: A | Discharge status: Home / Self Care | Payer: Commercial Managed Care - HMO | Source: Ambulatory Visit | Attending: Cardiothoracic Surgery | Admitting: Cardiothoracic Surgery

## 2014-12-02 DIAGNOSIS — R918 Other nonspecific abnormal finding of lung field: Secondary | ICD-10-CM

## 2014-12-02 LAB — PULMONARY FUNCTION TEST
DL/VA % pred: 48 %
DL/VA: 2.32 ml/min/mmHg/L
DLCO cor % pred: 32 %
DLCO cor: 7.86 ml/min/mmHg
DLCO unc % pred: 32 %
DLCO unc: 7.91 ml/min/mmHg
FEF 25-75 Pre: 0.69 L/sec
FEF2575-%Pred-Pre: 29 %
FEV1-%Pred-Pre: 42 %
FEV1-Pre: 1.09 L
FEV1FVC-%Pred-Pre: 88 %
FEV6-%Pred-Pre: 49 %
FEV6-Pre: 1.57 L
FEV6FVC-%Pred-Pre: 104 %
FVC-%Pred-Pre: 48 %
FVC-Pre: 1.58 L
Pre FEV1/FVC ratio: 69 %
Pre FEV6/FVC Ratio: 100 %
RV % pred: 138 %
RV: 2.76 L
TLC % pred: 92 %
TLC: 4.68 L

## 2014-12-03 ENCOUNTER — Telehealth: Payer: Self-pay | Admitting: *Deleted

## 2014-12-03 NOTE — Telephone Encounter (Signed)
Called pt and confirmed 12/04/14 clinic appt w/ her.  Pt understands arrival time/length of appt and location.  She is on oxygen and will need to use one of our tanks.  Made Dana aware.

## 2014-12-04 ENCOUNTER — Ambulatory Visit (HOSPITAL_BASED_OUTPATIENT_CLINIC_OR_DEPARTMENT_OTHER): Payer: Commercial Managed Care - HMO | Admitting: Internal Medicine

## 2014-12-04 ENCOUNTER — Ambulatory Visit: Payer: Commercial Managed Care - HMO | Attending: Internal Medicine | Admitting: Physical Therapy

## 2014-12-04 ENCOUNTER — Encounter: Payer: Self-pay | Admitting: *Deleted

## 2014-12-04 ENCOUNTER — Institutional Professional Consult (permissible substitution) (INDEPENDENT_AMBULATORY_CARE_PROVIDER_SITE_OTHER): Payer: Commercial Managed Care - HMO | Admitting: Cardiothoracic Surgery

## 2014-12-04 ENCOUNTER — Other Ambulatory Visit (HOSPITAL_COMMUNITY)
Admission: RE | Admit: 2014-12-04 | Discharge: 2014-12-04 | Disposition: A | Discharge status: Home / Self Care | Payer: Commercial Managed Care - HMO | Source: Ambulatory Visit | Attending: Internal Medicine | Admitting: Internal Medicine

## 2014-12-04 ENCOUNTER — Encounter: Payer: Self-pay | Admitting: Internal Medicine

## 2014-12-04 ENCOUNTER — Ambulatory Visit
Admission: RE | Admit: 2014-12-04 | Discharge: 2014-12-04 | Disposition: A | Discharge status: Home / Self Care | Payer: Commercial Managed Care - HMO | Source: Ambulatory Visit | Attending: Radiation Oncology | Admitting: Radiation Oncology

## 2014-12-04 VITALS — BP 116/70 | HR 93 | Temp 97.9°F | Resp 16 | Wt 256.4 lb

## 2014-12-04 VITALS — BP 116/70 | HR 93 | Temp 97.9°F | Resp 18 | Ht 65.0 in | Wt 256.4 lb

## 2014-12-04 DIAGNOSIS — M25561 Pain in right knee: Secondary | ICD-10-CM | POA: Insufficient documentation

## 2014-12-04 DIAGNOSIS — M25562 Pain in left knee: Secondary | ICD-10-CM

## 2014-12-04 DIAGNOSIS — R29898 Other symptoms and signs involving the musculoskeletal system: Secondary | ICD-10-CM

## 2014-12-04 DIAGNOSIS — C3412 Malignant neoplasm of upper lobe, left bronchus or lung: Secondary | ICD-10-CM | POA: Diagnosis not present

## 2014-12-04 DIAGNOSIS — R5381 Other malaise: Secondary | ICD-10-CM

## 2014-12-04 DIAGNOSIS — C3492 Malignant neoplasm of unspecified part of left bronchus or lung: Secondary | ICD-10-CM | POA: Diagnosis not present

## 2014-12-04 NOTE — Progress Notes (Signed)
[facility] Clinical Social Work  Clinical Social Work met with patient/family at [employer] appointment to offer support and assess for psychosocial needs.  Patient was accompanied by her brother and sister in law.  Ms. Coburn shared she is overwhelmed by the information she has received, but her only concern at this time is transportation.  The patient does not drive and does not have suitable transportation for 5 days a week radiation.  CSW and patient/family discussed different options.  SCAT and ACS Road to Recovery are best options for patient- CSW completed and faxed in SCAT application.  Once patient is ready to begin treatment, CSW will provide more information on ACS program.  Clinical Social Work briefly discussed [place] Work role and [employer] support programs/services.  Clinical Social Work encouraged patient to call with any additional questions or concerns.   Polo Riley, MSW, LCSW, OSW-C Clinical Social Worker [facility] [phone]

## 2014-12-04 NOTE — Progress Notes (Signed)
[place] Telephone:([phone]   [phone] Multidisciplinary thoracic oncology clinic  CONSULT NOTE  REFERRING PHYSICIAN: Dr. Asencion Noble  REASON FOR CONSULTATION:  59 years old white female recently diagnosed with lung cancer.  HPI [name] is a 59 y.o. female with multiple medical problems including history of coronary artery disease, hypertension, COPD, dyslipidemia, congestive heart failure, GERD, obesity status post Nissen fundoplication. The patient has been complaining of chest pain as well as shortness breath for few months. She was seen at the emergency department on 09/02/2014 and CT angiogram of the chest performed at that time and it showed 2.9 cm left lower lobe nodular opacity with surrounding ill-defined margins raising concern for malignancy but the infectious or inflammatory etiology are also considered. The patient was treated with a course of antibiotics. Follow-up chest x-ray on 10/29/2014 showed persistent left lower lobe lung mass worrisome for malignancy. This was followed by CT angiogram of the chest on 10/30/2014 and it showed enlargement of the left lower lobe mass which measured 3.6 x 4.2 x 3.8 cm concerning for neoplasm. There was also moderately prominent left hilar node measuring 1.0 and 1.3 cm.  The patient had a PET scan on 11/12/2014 and it showed an intensely hypermetabolic 5.3 x 4.6 cm left lower lobe lung mass with maximum SUV of 25.0 and increased in the interval from 4.2 x 4.0 cm questionable for primary bronchogenic carcinoma. The lung mass abuts the visceral pleural surface but no definite chest wall invasion. There is a mildly FDG avid mildly enlarged left infrahilar node measuring 1.0 cm with maximum SUV of 3.2. There are no additional enlarged or hypermetabolic hilar nodes and no hypermetabolic or enlarged mediastinal nodes. There was a 0.8 cm short axis right paratracheal node unchanged in size since 09/07/2010. On  11/18/2014 the patient underwent CT-guided core biopsy of the left lower lobe lung lesion by interventional radiology. The final pathology (Accession: [id number]) showed adenocarcinoma. The malignant cells are positive for TTF-1 and Napsin-A. They are negative for WT-1, and essentially negative for estrogen receptor. These findings are consistent with primary lung adenocarcinoma.  Dr. Joya Gaskins kindly referred the patient to the multidisciplinary thoracic oncology clinic today for further evaluation and recommendation regarding management of her recently diagnosed lung cancer. When seen today the patient continues to complain of shortness of breath and she is currently on home oxygen 4 L/m she also has dry cough but no significant chest pain or hemoptysis. She denied having any significant weight loss or night sweats. She continues to have arthralgias especially in the knees bilaterally as well as the swelling of the lower extremities and she is currently on diuretics. The patient has no significant headache or visual changes. Family history significant for mother and father with skin cancer and heart disease. She also has a sister with ovarian cancer. The patient is single and has 2 children a son and daughter. She was accompanied today by her brother Darnell Level and her sister-in-law Opal Sidles. She used to work in [employer]. She has a history of smoking up to 2 packs per day for around 45 years and unfortunately she continues to smoke the 0.5 pack per day. She has no history of alcohol or drug abuse.  HPI  Past Medical History  Diagnosis Date  . Coronary artery disease     S/P PCI of LAD with DES (12/2008). Total occlusion of RCA noted at that time., medically managed. ACS ruled out 03/2009 with Lexiscan myoview .  Followed by [facility].  . Pulmonary hypertension     2-D Echo (67/5916) - Systolic pressure was moderately increased. PA peak pressure  72mHg. secondary pulm htn likely on basis of comb of  interstital lung disease, severe copd, small airways disease, severe sleep apnea and cor pulmonale,. Followed by Dr. WJoya Gaskins(Vel[facility] Heckler  . Diastolic dysfunction     2-D Echo (12/2008) - Normal LV Systolic funciton with EF 60-65%. Grade 1 diastolid dysfunction. No regional wall motion abnormalities. Moderate pulmonary HTN with PA peak pressure 521mg.  . Marland KitchenOPD (chronic obstructive pulmonary disease)     Severe. Gold Stage IV.  PFTs (12/2008) - severe obstructive airway disease. Active tobacco use. Requires 4L O2 at home.  . Pulmonary nodule, right     Small right middle lobe nodule. Stable as of 12/2008.  . Marland Kitchenrediabetes 12/2008    HgbA1c 6.4 (12/2008)  . Hx MRSA infection     Recurrent MRSA thigh abscesses.  . Tobacco abuse     Ongoing.  . Obesity   . Hyperlipidemia   . GERD (gastroesophageal reflux disease)     S/P Nissen fundoplication.  . CHF (congestive heart failure)   . Shortness of breath dyspnea     Past Surgical History  Procedure Laterality Date  . Total abdominal hysterectomy w/ bilateral salpingoophorectomy    . Nissen fundoplication    . Right heart catheterization N/A 11/09/2012    Procedure: RIGHT HEART CATH;  Surgeon: DaLarey DresserMD;  [facility];  Service: Cardiovascular;  Laterality: N/A;    Family History  Problem Relation Age of Onset  . Heart disease Mother 4753  Deceased from MI at 60yo. Hypertension Mother   . Heart disease Father [id number]  Deceased of MI age 60yo. Hypertension Father   . Hypertension Brother   . Lung cancer      Grandmother    Social History Social History  Substance Use Topics  . Smoking status: Current Every Day Smoker -- 1.00 packs/day    Types: Cigarettes    Start date: 04/04/1969  . Smokeless tobacco: Never Used     Comment: 1/2 pack or less. 1 pack to .5 pack per day  . Alcohol Use: No    Allergies  Allergen Reactions  . Fluconazole Anaphylaxis, Itching and Swelling  . Atorvastatin Other (See Comments)     Dizziness after med started and resolved per pt after med stopped. September 08 2014    Current Outpatient Prescriptions  Medication Sig Dispense Refill  . albuterol (PROVENTIL) (2.5 MG/3ML) 0.083% nebulizer solution Take 3 mLs (2.5 mg total) by nebulization every 6 (six) hours as needed for shortness of breath. 360 mL 3  . ALPRAZolam (XANAX) 0.5 MG tablet Take 1 tablet prior to PET scan 1 tablet 0  . aspirin 81 MG chewable tablet Chew 1 tablet (81 mg total) by mouth daily. (Patient taking differently: Chew 81 mg by mouth at bedtime. ) 30 tablet 11  . Aspirin-Salicylamide-Caffeine (BC HEADACHE PO) Take 1 Package by mouth daily as needed (pain).    . Fluticasone-Salmeterol (ADVAIR DISKUS) 250-50 MCG/DOSE AEPB Inhale 1 puff into the lungs 2 (two) times daily. 60 each 5  . furosemide (LASIX) 40 MG tablet Take 1 tablet (40 mg total) by mouth 2 (two) times daily. 60 tablet 6  . lovastatin (MEVACOR) 40 MG tablet Take 1 tablet (40 mg total) by mouth at bedtime. 30 tablet 5  . pantoprazole (PROTONIX) 40 MG tablet  TAKE 1 TABLET(40 MG) BY MOUTH DAILY 90 tablet 0  . Potassium Chloride ER 20 MEQ TBCR Take 40 mEq by mouth daily. (Patient taking differently: Take 20 mEq by mouth 2 (two) times daily. ) 30 tablet 1  . tiotropium (SPIRIVA HANDIHALER) 18 MCG inhalation capsule Place 1 capsule (18 mcg total) into inhaler and inhale daily. 30 capsule 12  . VENTOLIN HFA 108 (90 BASE) MCG/ACT inhaler Inhale 2 puffs into the lungs every 6 (six) hours as needed for shortness of breath.   2   No current facility-administered medications for this visit.    Review of Systems  Constitutional: positive for fatigue Eyes: negative Ears, nose, mouth, throat, and face: negative Respiratory: positive for cough, dyspnea on exertion and wheezing Cardiovascular: negative Gastrointestinal: negative Genitourinary:negative Integument/breast: negative Hematologic/lymphatic: negative Musculoskeletal:negative Neurological:  negative Behavioral/Psych: negative Endocrine: negative Allergic/Immunologic: negative  Physical Exam  QAS:TMHDQ, healthy, no distress, well nourished and well developed SKIN: skin color, texture, turgor are normal, no rashes or significant lesions HEAD: Normocephalic, No masses, lesions, tenderness or abnormalities EYES: normal, PERRLA EARS: External ears normal, Canals clear OROPHARYNX:no exudate, no erythema and lips, buccal mucosa, and tongue normal  NECK: supple, no adenopathy, no JVD LYMPH:  no palpable lymphadenopathy, no hepatosplenomegaly BREAST:not examined LUNGS: expiratory wheezes bilaterally HEART: regular rate & rhythm, no murmurs and no gallops ABDOMEN:abdomen soft, non-tender, obese, normal bowel sounds and no masses or organomegaly BACK: Back symmetric, no curvature., No CVA tenderness EXTREMITIES:no joint deformities, effusion, or inflammation, 1+ edema bilaterally.  NEURO: alert & oriented x 3 with fluent speech, no focal motor/sensory deficits  PERFORMANCE STATUS: ECOG 1  LABORATORY DATA: Lab Results  Component Value Date   WBC 10.1 11/18/2014   HGB 13.6 11/18/2014   HCT 41.9 11/18/2014   MCV 100.0 11/18/2014   PLT 231 11/18/2014      Chemistry      Component Value Date/Time   NA 139 11/03/2014 1120   K 3.6 11/03/2014 1120   CL 92* 11/03/2014 1120   CO2 35* 11/03/2014 1120   BUN 4* 11/03/2014 1120   CREATININE 0.88 11/03/2014 1120   CREATININE 0.87 10/08/2014 1031      Component Value Date/Time   CALCIUM 8.9 11/03/2014 1120   ALKPHOS 116 10/29/2014 1844   AST 14* 10/29/2014 1844   ALT 8* 10/29/2014 1844   BILITOT 1.0 10/29/2014 1844       RADIOGRAPHIC STUDIES: Dg Chest 1 View  11/18/2014   CLINICAL DATA:  Follow-up after left lung lesion biopsy.  EXAM: CHEST  1 VIEW  COMPARISON:  11/18/2014 and 10/29/2014  FINDINGS: Patient has a known round mass in the left lower chest. No evidence for a pneumothorax. Densities in lower chest probably  represent overlying soft tissues or atelectasis. Heart size is upper limits of normal but unchanged.  IMPRESSION: Negative for pneumothorax following the left lung lesion biopsy.   Electronically Signed   By: Markus Daft M.D.   On: 11/18/2014 12:05   Nm Pet Image Initial (pi) Skull Base To Thigh  11/12/2014   CLINICAL DATA:  Initial treatment strategy for left lower lobe lung mass.  EXAM: NUCLEAR MEDICINE PET SKULL BASE TO THIGH  TECHNIQUE: 16.6 mCi F-18 FDG was injected intravenously. Full-ring PET imaging was performed from the skull base to thigh after the radiotracer. CT data was obtained and used for attenuation correction and anatomic localization.  FASTING BLOOD GLUCOSE:  Value: 160 mg/dl  COMPARISON:  Most recent chest CT from 10/30/2014.  FINDINGS:  NECK  No hypermetabolic lymph nodes in the neck.  CHEST  There is an intensely hypermetabolic 5.3 x 4.6 cm left lower lobe lung mass (series 6/image 50) with max SUV 25.0, which demonstrates irregular margins, interval increased size from 4.2 x 4.0 cm on 10/30/2014, in keeping with a primary bronchogenic carcinoma. This lung mass abuts the visceral pleural surface, with no definite chest wall invasion.  There are no additional significant pulmonary nodules or lung masses. No acute consolidative airspace disease. Mild scarring versus atelectasis in the right middle lobe, lingula and left lower lobe.  There is a mildly FDG avid mildly enlarged left infrahilar node measuring 1.0 cm short axis (series 4/image 74) with max SUV 3.2. There are no additional enlarged or hypermetabolic hilar nodes.  No hypermetabolic or enlarged mediastinal nodes. A 0.8 cm short axis right paratracheal node (4/52) is unchanged in size since 09/07/2010 chest CT and demonstrates a max SUV of 2.8, equal to the mediastinal blood pool activity.  There is no pleural effusion or pneumothorax. There is mild cardiomegaly. There is atherosclerosis of the thoracic aorta, the great vessels of the  mediastinum and the coronary arteries, including calcified atherosclerotic plaque in the left anterior descending, left circumflex and right coronary arteries. The main pulmonary artery is dilated (3.4 cm diameter), unchanged.  ABDOMEN/PELVIS  No abnormal hypermetabolic activity within the liver, pancreas, adrenal glands, or spleen. No hypermetabolic lymph nodes in the abdomen or pelvis. Surgical clips are again noted surrounding the proximal stomach. Infrarenal 3.2 cm abdominal aortic aneurysm. Status post hysterectomy, with no abnormal findings at the vaginal cuff. No adnexal abnormality.  SKELETON  No focal hypermetabolic activity to suggest skeletal metastasis.  IMPRESSION: 1. 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 2. Mildly FDG avid left infrahilar node, possibly metastatic. Otherwise no evidence of hypermetabolic metastatic disease. 3. Atherosclerosis, including three-vessel coronary artery disease. Please note that although the presence of coronary artery calcium documents the presence of coronary artery disease, the severity of this disease and any potential stenosis cannot be assessed on this non-gated CT examination. Assessment for potential risk factor modification, dietary therapy or pharmacologic therapy may be warranted, if clinically indicated. 4. Infrarenal 3.2 cm abdominal aortic aneurysm. Recommend followup by ultrasound in 3 years. This recommendation follows ACR consensus guidelines: White Paper of the ACR Incidental Findings Committee II on Vascular Findings. [facility] Radiol 2013; 10:789-794   Electronically Signed   By: Ilona Sorrel M.D.   On: 11/12/2014 15:08   Ct Biopsy  11/18/2014   CLINICAL DATA:  Hypermetabolic left lower lobe lung lesion  EXAM: CT GUIDED CORE BIOPSY OF LEFT LOWER LOBE LUNG LESION  ANESTHESIA/SEDATION: Intravenous Fentanyl and Versed were administered as conscious sedation during  continuous cardiorespiratory monitoring by the radiology RN, with a total moderate sedation time of 4 minutes.  PROCEDURE: The procedure risks, benefits, and alternatives were explained to the patient. Questions regarding the procedure were encouraged and answered. The patient understands and consents to the procedure.  Select axial scans through the thorax were obtained. The lesion was localized and an appropriate skin entry site was determined and marked.  The operative field was prepped with Betadinein a sterile fashion, and a sterile drape was applied covering the operative field. A sterile gown and sterile gloves were used for the procedure. Local anesthesia was provided with 1% Lidocaine.  Under CT fluoroscopic guidance, a 17 gauge trocar needle was advanced to the  margin of the lesion. Once needle tip position was confirmed, coaxial 18-gauge core biopsy samples were obtained, submitted in formalin to surgical pathology. The guide needle was removed. Postprocedure scans show no pneumothorax or significant regional alveolar hemorrhage. The patient tolerated the procedure well.  COMPLICATIONS: None immediate  FINDINGS: Left lower lobe lung mass was again localized. CT-guided core biopsy samples were obtained without evident complication.  IMPRESSION: 1. Technically successful CT-guided left lower lobe lung lesion core biopsy.   Electronically Signed   By: Lucrezia Europe M.D.   On: 11/18/2014 12:40    ASSESSMENT: This is a very pleasant 59 years old white female with questionably stage IIA (T2b, N0, M0) non-small cell lung cancer, adenocarcinoma diagnosed in August 2016 and presented with left lower lobe lung mass. This could be also stage IIb if the left hilar lymph nodes are positive for malignancy.   PLAN: I had a lengthy discussion with the patient and her family today about her current disease stage, prognosis and treatment options. The patient has multiple medical condition and she is currently on home  oxygen. I doubt she will be a good surgical candidate for resection. She will be seen later today by Dr. Prescott Gum for evaluation and consideration of bronchoscopy with endobronchial biopsy for evaluation of the left hilar lymphadenopathy. If the hilar lymph nodes are negative for malignancy, the patient may benefit from curative radiotherapy but if it is positive for malignancy she may be considered for a course of concurrent chemoradiation. The patient will be seen later today by Dr. Pablo Ledger for evaluation and discussion of the radiotherapy option. I will arrange for the patient to come back for follow-up visit in 2 weeks for reevaluation and further discussion of her treatment options based on the biopsy results. I will complete the staging workup by ordering CT scan of the head to rule out brain metastases as the patient is claustrophobic and did not prefer to proceed with MRI of the brain. The patient and her family the time to ask questions and I answered them completely to their satisfaction. The patient was seen at the multidisciplinary thoracic oncology clinic today by medical oncology, radiation oncology, thoracic surgery, thoracic navigator, social worker and physical therapist.  The patient voices understanding of current disease status and treatment options and is in agreement with the current care plan.  All questions were answered. The patient knows to call the clinic with any problems, questions or concerns. We can certainly see the patient much sooner if necessary.  Thank you so much for allowing me to participate in the care of LOUNA ROTHGEB. I will continue to follow up the patient with you and assist in her care.  I spent 40 minutes counseling the patient face to face. The total time spent in the appointment was 60 minutes.  Disclaimer: This note was dictated with voice recognition software. Similar sounding words can inadvertently be transcribed and may not be corrected upon  review.   Magdalyn Arenivas K. December 04, 2014, 3:14 PM

## 2014-12-04 NOTE — H&P (Signed)
PCP is Karren Cobble, MD Referring Provider is Elsie Stain, MD  No chief complaint on file.  biopsy-proven adenocarcinoma left lower lobe, 4.2 cm  HPI: 59 year old Caucasian obese female active smoker on home oxygen was evaluated for shortness of breath and chest pain. CT scan showed a left lower lobe mass 4 cm in diameter. PET scan was performed showing this to be hypermetabolic without evidence of mediastinal nodal involvement or distant metastatic disease. CT scan of the head last year was negative for metastatic disease.  The patient has severe COPD and is  on home oxygen 4 L/m. She was admitted for COPD flare up in May of this year. She has never been on a ventilator for her COPD. She has  previously had pulmonary function testing--FEV1 1.0, FVC 1.4, DLCO diffusion capacity only 32 percent of predicted. She has cor pulmonale and ejection fraction of 50%. She had a total RCA occlusion in 2010 which was treated with PCI and drug-eluting stent.  She presents to the thoracic oncology clinic for evaluation of possible resection. The radiation oncologist and medical oncologist also are concerned about a  N1 level hilar lymph node along the left mainstem bronchus with mild activity and PET scan.  The patient had minimal hemoptysis after her transthoracic needle biopsy of the left lower lobe mass.   Past Medical History  Diagnosis Date  . Coronary artery disease     S/P PCI of LAD with DES (12/2008). Total occlusion of RCA noted at that time., medically managed. ACS ruled out 03/2009 with Lexiscan myoview . Followed by [place].  . Pulmonary hypertension     2-D Echo (05/7251) - Systolic pressure was moderately increased. PA peak pressure  75mHg. 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 Followed by Dr. WJoya Gaskins(Velora Heckler  . Diastolic dysfunction     2-D Echo (12/2008) - Normal LV  Systolic funciton with EF 60-65%. Grade 1 diastolid dysfunction. No regional wall motion abnormalities. Moderate pulmonary HTN with PA peak pressure 54mg.  . Marland KitchenOPD (chronic obstructive pulmonary disease)     Severe. Gold Stage IV.  PFTs (12/2008) - severe obstructive airway disease. Active tobacco use. Requires 4L O2 at home.  . Pulmonary nodule, right     Small right middle lobe nodule. Stable as of 12/2008.  . Marland Kitchenrediabetes 12/2008    HgbA1c 6.4 (12/2008)  . Hx MRSA infection     Recurrent MRSA thigh abscesses.  . Tobacco abuse     Ongoing.  . Obesity   . Hyperlipidemia   . GERD (gastroesophageal reflux disease)     S/P Nissen fundoplication.  . CHF (congestive heart failure)   . Shortness of breath dyspnea     Past Surgical History  Procedure Laterality Date  . Total abdominal hysterectomy w/ bilateral salpingoophorectomy    . Nissen fundoplication    . Right heart catheterization N/A 11/09/2012    Procedure: RIGHT HEART CATH;  Surgeon: DaLarey DresserMD;  [facility];  Service: Cardiovascular;  Laterality: N/A;    Family History  Problem Relation Age of Onset  . Heart disease Mother 474  Deceased from MI at 471yo. Hypertension Mother   . Heart disease Father 5425  Deceased of MI age 60yo. Hypertension Father   . Hypertension Brother   . Lung cancer      Grandmother  Social History Social History  Substance Use Topics  . Smoking status: Current Every Day Smoker -- 1.00 packs/day    Types: Cigarettes    Start date: 04/04/1969  . Smokeless tobacco: Never Used     Comment: 1/2 pack or less. 1 pack to .5 pack per day  . Alcohol Use: No    Current Outpatient Prescriptions  Medication Sig Dispense Refill  . albuterol (PROVENTIL) (2.5 MG/3ML) 0.083% nebulizer solution Take 3 mLs (2.5 mg total) by nebulization every 6 (six) hours as needed for shortness of breath. 360 mL 3  . ALPRAZolam (XANAX) 0.5 MG tablet Take 1 tablet prior to PET scan 1 tablet 0   . aspirin 81 MG chewable tablet Chew 1 tablet (81 mg total) by mouth daily. (Patient taking differently: Chew 81 mg by mouth at bedtime. ) 30 tablet 11  . Aspirin-Salicylamide-Caffeine (BC HEADACHE PO) Take 1 Package by mouth daily as needed (pain).    . Fluticasone-Salmeterol (ADVAIR DISKUS) 250-50 MCG/DOSE AEPB Inhale 1 puff into the lungs 2 (two) times daily. 60 each 5  . furosemide (LASIX) 40 MG tablet Take 1 tablet (40 mg total) by mouth 2 (two) times daily. 60 tablet 6  . lovastatin (MEVACOR) 40 MG tablet Take 1 tablet (40 mg total) by mouth at bedtime. 30 tablet 5  . pantoprazole (PROTONIX) 40 MG tablet TAKE 1 TABLET(40 MG) BY MOUTH DAILY 90 tablet 0  . Potassium Chloride ER 20 MEQ TBCR Take 40 mEq by mouth daily. (Patient taking differently: Take 20 mEq by mouth 2 (two) times daily. ) 30 tablet 1  . tiotropium (SPIRIVA HANDIHALER) 18 MCG inhalation capsule Place 1 capsule (18 mcg total) into inhaler and inhale daily. 30 capsule 12  . VENTOLIN HFA 108 (90 BASE) MCG/ACT inhaler Inhale 2 puffs into the lungs every 6 (six) hours as needed for shortness of breath.   2   No current facility-administered medications for this visit.    Allergies  Allergen Reactions  . Fluconazole Anaphylaxis, Itching and Swelling  . Atorvastatin Other (See Comments)    Dizziness after med started and resolved per pt after med stopped. September 08 2014    Review of Systems        Review of Systems :  [ y ] = yes, [  ] = no        General :  Weight gain [   ]    Weight loss  [   ]  Fatigue [  ]  Fever [  ]  Chills  [  ]                                Weakness  [  ]           Cardiac :  Chest pain/ pressure [  ]  Resting SOB [  ] exertional SOB [ y ]                        Orthopnea [  ]  Pedal edema  [  y]  Palpitations [  ] Syncope/presyncope _0                         Paroxysmal nocturnal dyspnea [  ]        Pulmonary : cough Blue.Reese  ]  wheezing [  y]  Hemoptysis [  ]  Sputum [ y ] Snoring [  ]                               Pneumothorax [  ]  Sleep apnea [  ]       GI : Vomiting [  ]  Dysphagia [  ]  Melena  [  ]  Abdominal pain [  ] BRBPR [  ]              Heart burn [ y]  Constipation [  ] Diarrhea  [  ] Colonoscopy [  ]       GU : Hematuria [  ]  Dysuria [  ]  Nocturia [  ] UTI's [  ]       Vascular : Claudication [  ]  Rest pain [ y ]  DVT [  ] Vein stripping [  ] leg ulcers [  ]                          TIA [  ] Stroke [  ]  Varicose veins [  ]       NEURO :  Headaches  [  ] Seizures [  ] Vision changes [  ] Paresthesias [  ]       Musculoskeletal :  Arthritis Blue.Reese  ] Gout  [  ]  Back pain [  ]  Joint pain [  ]       Skin :  Rash [  ]  Melanoma [  ]        Heme : Bleeding problems [  ]Clotting Disorders [  ] Anemia [  ]Blood Transfusion _0        Endocrine : Diabetes [  ] Thyroid Disorder  [  ]       Psych : Depression [  y]  Anxiety [  ]  Psych hospitalizations [  ]        Multiple previous operations including fundoplication for GERD and low back surgery. No history of thoracic trauma. No history of pneumothorax.                                     BP 116/70 mmHg  Pulse 93  Temp(Src) 97.9 F (36.6 C)  Resp 16  Wt 256 lb 6.4 oz (116.302 kg)  SpO2 93% Physical Exam      Physical Exam  General: Obese Caucasian female on nasal cannula oxygen and sitting in wheelchair HEENT: Normocephalic pupils equal , dentition with full dental plates Neck: Supple without JVD, adenopathy, or bruit Chest: Clear to auscultation, symmetrical breath sounds, no rhonchi, no tenderness             or deformity Cardiovascular: Regular rate and rhythm, no murmur, no gallop, peripheral pulses             palpable in all extremities Abdomen: Obese  Soft, nontender, no palpable mass or organomegaly Extremities: Warm, well-perfused, no clubbing cyanosis , mild edema without tenderness,              no venous stasis changes of the legs Rectal/GU: Deferred Neuro: Grossly non--focal and symmetrical  throughout Skin: Clean and dry without rash or ulceration   Diagnostic Tests: Chest CT scan, PET scan, primary function  tests and echocardiogram all personally reviewed  Impression: 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 The patient is not a candidate for VATS-lobectomy because of her severe COPD and cor pulmonale-pulmonary hypertension. Recommend radiation therapy with possible combination chemotherapy.  Because of the left hilar node with mild activity and PET scan will proceed with video bronchoscopy and endobronchial ultrasound directed biopsy of the node a [place] on September 12. I've discussed the procedure including use of general anesthesia and the risks of hemoptysis and she understands and agrees to proceed with surgery. We will plan on this being outpatient but the patient understands she may need to be watched overnight because of her poor pulmonary  function.  Plan: Bronchoscopy-EBUS September 12 at [facility] hospital   Len Childs, MD [facility] [phone]

## 2014-12-04 NOTE — Therapy (Signed)
[place], [place], [address] Phone: [phone]   Fax:  [phone]  Physical Therapy Evaluation  Patient Details  Name: [name] MRN: [id number] Date of Birth: 01/04/1956 Referring Provider:  Curt Bears, MD  Encounter Date: 12/04/2014      PT End of Session - 12/04/14 1742    Visit Number 1   Number of Visits 1   PT Start Time 2446   PT Stop Time 1555  with interruption   PT Time Calculation (min) 40 min   Activity Tolerance Treatment limited secondary to medical complications (Comment)   Behavior During Therapy [facility] for tasks assessed/performed      Past Medical History  Diagnosis Date  . Coronary artery disease     S/P PCI of LAD with DES (12/2008). Total occlusion of RCA noted at that time., medically managed. ACS ruled out 03/2009 with Lexiscan myoview . Followed by [place].  . Pulmonary hypertension     2-D Echo (28/6381) - Systolic pressure was moderately increased. PA peak pressure  74mHg. 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 Followed by Dr. WJoya Gaskins(Velora Heckler  . Diastolic dysfunction     2-D Echo (12/2008) - Normal LV Systolic funciton with EF 60-65%. Grade 1 diastolid dysfunction. No regional wall motion abnormalities. Moderate pulmonary HTN with PA peak pressure 512mg.  . Marland KitchenOPD (chronic obstructive pulmonary disease)     Severe. Gold Stage IV.  PFTs (12/2008) - severe obstructive airway disease. Active tobacco use. Requires 4L O2 at home.  . Pulmonary nodule, right     Small right middle lobe nodule. Stable as of 12/2008.  . Marland Kitchenrediabetes 12/2008    HgbA1c 6.4 (12/2008)  . Hx MRSA infection     Recurrent MRSA thigh abscesses.  . Tobacco abuse     Ongoing.  . Obesity   . Hyperlipidemia   . GERD (gastroesophageal reflux disease)     S/P Nissen fundoplication.  . CHF  (congestive heart failure)   . Shortness of breath dyspnea     Past Surgical History  Procedure Laterality Date  . Total abdominal hysterectomy w/ bilateral salpingoophorectomy    . Nissen fundoplication    . Right heart catheterization N/A 11/09/2012    Procedure: RIGHT HEART CATH;  Surgeon: DaLarey DresserMD;  [facility];  Service: Cardiovascular;  Laterality: N/A;    There were no vitals filed for this visit.  Visit Diagnosis:  Physical debility - Plan: PT plan of care cert/re-cert  Pain in joint, lower leg, right - Plan: PT plan of care cert/re-cert  Pain in joint involving lower leg, left - Plan: PT plan of care cert/re-cert  Weakness of both legs - Plan: PT plan of care cert/re-cert      Subjective Assessment - 12/04/14 1558    Subjective Pt. presented with chest pain and shortness of breath 10/30/14.   Patient is accompained by: Family member  brother and his wife   Pertinent History CT 10/30/14 showed left lung mass; had PET and CT biopsy which led to diagnosis of left lower lobe adenocarcinoma.  Left borderline enlarged hilar node but no clear hypermetabolism.  current smoker; home O2, CHF, pulmonary HTN, CAD, AAA.  Patient Stated Goals get info from all providers at multidisciplinary clinic   Currently in Pain? Yes   Pain Score 6    Pain Location Leg   Pain Orientation Right;Left   Pain Descriptors / Indicators Other (Comment)  knees feel stiff   Pain Onset More than a month ago   Aggravating Factors  sitting too long   Pain Relieving Factors Tylenol, changing positions            [facility] PT Assessment - 12/04/14 0001    Assessment   Medical Diagnosis left lower lobe adenocarcinoma   Onset Date/Surgical Date 10/30/14   Precautions   Precautions Fall;Other (comment)  on O2 fulltime; cancer precautions   Restrictions   Weight Bearing Restrictions No   Balance Screen   Has the patient fallen  in the past 6 months No   Has the patient had a decrease in activity level because of a fear of falling?  No   Is the patient reluctant to leave their home because of a fear of falling?  No   Home Social worker Private residence   Living Arrangements Other relatives;Non-relatives/Friends  grandson   Type of [place] Other (Comment)  one step inside house, difficult for her to negotiate   Prior Function   Level of Independence Needs assistance with ADLs;Needs assistance with homemaking  uses a shower chair   Leisure no exercise; on O2 fulltime with limited mobility   Cognition   Overall Cognitive Status Within Functional Limits for tasks assessed   Functional Tests   Functional tests Sit to Stand   Sit to Stand   Comments not tested due to need for O2 and limited mobility   Posture/Postural Control   Posture/Postural Control Postural limitations   Postural Limitations Flexed trunk   ROM / Strength   AROM / PROM / Strength AROM   AROM   Overall AROM Comments unable to test standing trunk AROM as patient reports bending would result in dizziness; UE AROM with moderate limitations in shoulder motions bilat.   Ambulation/Gait   Ambulation/Gait Yes   Ambulation/Gait Assistance 6: Modified independent (Device/Increase time)  per pt.'s report;    Ambulation Distance (Feet) --  uses wheelchair for distances at hospital   Assistive device Other (Comment)  has mutliple devices but doesn't use them; uses cart at [place] Yes   Dynamic Standing Balance   Dynamic Standing - Comments reaches 10 inches forward in standing, low for her age                           PT Education - 12/04/14 1741    Education provided Yes   Education Details posture, breathing, staying active, energy conservation, PT info   Person(s) Educated Patient;Other (comment)  brother and sister-in-law   Methods Explanation;Handout    Comprehension Verbalized understanding               Lung Clinic Goals - 12/04/14 1749    Patient will be able to verbalize understanding of the benefit of exercise to decrease fatigue.   Status Achieved   Patient will be able to verbalize the importance of posture.   Status Achieved   Patient will be able to demonstrate diaphragmatic breathing for improved lung function.   Status Achieved   Patient will be able to verbalize understanding of the role of physical therapy to prevent  functional decline and who to contact if physical therapy is needed.   Status Achieved             Plan - 12-15-2014 1743    Clinical Impression Statement Very debilitated 59 year-old woman on fulltime O2 at 2L, in a wheelchair today for traveling hospital hallway distances and unable to perform someof evaluation due to episodes of dizziness and debility.                                                     Pt will benefit from skilled therapeutic intervention in order to improve on the following deficits Cardiopulmonary status limiting activity;Decreased mobility   Rehab Potential Fair   Clinical Impairments Affecting Rehab Potential On fulltime O2   PT Frequency One time visit   PT Treatment/Interventions Patient/family education   PT Next Visit Plan None at this time; may benefit from therapy to improve mobility such as with negotiating the step in her home   PT Home Exercise Plan see education section   Consulted and Agree with Plan of Care Patient          G-Codes - 12/15/14 1749    Functional Assessment Tool Used clinical judgement   Functional Limitation Mobility: Walking and moving around   Mobility: Walking and Moving Around Current Status [phone]) At least 60 percent but less than 80 percent impaired, limited or restricted   Mobility: Walking and Moving Around Goal Status [phone]) At least 60 percent but less than 80 percent impaired, limited or restricted   Mobility: Walking and Moving  Around Discharge Status [phone]) At least 60 percent but less than 80 percent impaired, limited or restricted       Problem List Patient Active Problem List   Diagnosis Date Noted  . Non-small cell carcinoma of lung 2014/12/15  . Dyspnea 10/30/2014  . Hypokalemia 10/09/2014  . Pain of right lower extremity 09/02/2014  . Right knee pain 07/23/2014  . Hip pain   . Chest pain 06/29/2014  . Blurry vision, bilateral 02/10/2014  . Bilateral hand pain 01/22/2014  . Preventative health care 02/06/2013  . Chronic diastolic CHF (congestive heart failure) 11/05/2012  . Hyperlipidemia   . Urge incontinence 06/15/2011  . Financial difficulties 06/15/2010  . Pulmonary hypertension 01/05/2009  . Obesity 12/31/2008  . Sleep apnea 12/31/2008  . TOBACCO ABUSE 12/22/2008  . Coronary atherosclerosis 12/22/2008  . Severe chronic obstructive pulmonary disease 12/22/2008  . Lung mass 12/22/2008  . GASTROESOPHAGEAL REFLUX DISEASE 12/22/2008  . Prediabetes 12/22/2008    SALISBURY,DONNA 12-15-14, 5:52 PM  [place], [place], [address] Phone: [phone]   Fax:  [place], PT 2014-12-15 5:52 PM

## 2014-12-04 NOTE — Progress Notes (Signed)
  Radiation Oncology         [phone]) [phone] ________________________________  Initial outpatient Consultation - Date: 12/04/2014   Name: [name] MRN: [id number]   DOB: May 01, 1955  REFERRING PHYSICIAN: Elsie Stain, MD  DIAGNOSIS AND STAGE: T2Nx Stage II Lung Cancer  HISTORY OF PRESENT ILLNESS::[name] is a 59 y.o. female who presented with chest pain and shortness of breath. A CT scan on 10/30/14 showed a left lung mass. A PET scan on 11/12/14 shows this is the only mass with questionable spread to a precarinal lymph node and a lymph node along the left proximal bronchus. No metastases were noted. A CT guided biopsy on 11/18/14 revealed adenocarcinoma of the left lung. She is scheduled to undergo core biopsy of a borderline lymph node along her left proximal bronchus on 12/15/14 by Dr. Prescott Gum. I was asked to see her today for my opinion regarding radiation and the management of her disease.  PREVIOUS RADIATION THERAPY: No  Past medical, social and family history were reviewed in the electronic chart. Review of symptoms was reviewed in the electronic chart. Medications were reviewed in the electronic chart.   PHYSICAL EXAM: There were no vitals filed for this visit.. . Pt presents to the clinic in a wheelchair. Nasal cannula in place with 2 L of oxygen.  IMPRESSION: T2Nx Stage II Lung Cancer  PLAN: She was seen today at the multidisciplinary thoracic oncology clinic. She will undergo core biopsy of a borderline lymph node along her left proximal bronchus on 12/15/14 by Dr. Prescott Gum. If the biopsy is negative, then the left lower lung mass will be treated with radiation alone. If the lymph node is positive, then treatment would involve chemotherapy as well. She will undergo an MRI to rule out brain metastasis. We discussed her diagnosis and stage. We discussed 6 weeks of treatment as an outpatient. We discussed the process of simulation and the placement of tattoos. We discussed  dysphagia, weight loss and fatigue as the acute side effects of radiation. We discussed damage to critical normal structures in the chest as well as the spinal cord as possible but improbably. We will schedule him for sim next week  and begin treatments as soon as possible. I encouraged her to start eating and gaining weight.   I spent 40 minutes  face to face with the patient and more than 50% of that time was spent in counseling and/or coordination of care.   This document serves as a record of services personally performed by Thea Silversmith, MD. It was created on her behalf by Darcus Austin, a trained medical scribe. The creation of this record is based on the scribe's personal observations and the provider's statements to them. This document has been checked and approved by the attending provider.   ------------------------------------------------  Thea Silversmith, MD

## 2014-12-05 ENCOUNTER — Telehealth: Payer: Self-pay | Admitting: Internal Medicine

## 2014-12-05 ENCOUNTER — Other Ambulatory Visit: Payer: Self-pay | Admitting: Internal Medicine

## 2014-12-05 ENCOUNTER — Telehealth: Payer: Self-pay | Admitting: Critical Care Medicine

## 2014-12-05 DIAGNOSIS — K219 Gastro-esophageal reflux disease without esophagitis: Secondary | ICD-10-CM

## 2014-12-05 DIAGNOSIS — J449 Chronic obstructive pulmonary disease, unspecified: Secondary | ICD-10-CM

## 2014-12-05 NOTE — Telephone Encounter (Signed)
No need to see me. Focus on cancer MDs

## 2014-12-05 NOTE — Telephone Encounter (Signed)
Spoke with pt, aware of below recs.  appt cancelled.  Nothing further needed.

## 2014-12-05 NOTE — Telephone Encounter (Signed)
s.w. pt and advised on Sept appt pt ok and aware

## 2014-12-05 NOTE — Telephone Encounter (Signed)
Patient wants to know if it is necessary to see Dr. Joya Gaskins on 9/14.  She says that she is seeing so many providers right now and having so many procedures done.  She says that if it is necessary for her to come she will, but she prefers not to come if not necessary.  Dr. Joya Gaskins, please advise.

## 2014-12-06 DIAGNOSIS — J449 Chronic obstructive pulmonary disease, unspecified: Secondary | ICD-10-CM | POA: Diagnosis not present

## 2014-12-06 DIAGNOSIS — J9600 Acute respiratory failure, unspecified whether with hypoxia or hypercapnia: Secondary | ICD-10-CM | POA: Diagnosis not present

## 2014-12-06 DIAGNOSIS — R0902 Hypoxemia: Secondary | ICD-10-CM | POA: Diagnosis not present

## 2014-12-09 ENCOUNTER — Other Ambulatory Visit: Payer: Self-pay | Admitting: *Deleted

## 2014-12-09 DIAGNOSIS — C3490 Malignant neoplasm of unspecified part of unspecified bronchus or lung: Secondary | ICD-10-CM

## 2014-12-09 NOTE — Addendum Note (Signed)
Addended by: Truddie Crumble on: 12/09/2014 12:05 PM   Modules accepted: Orders

## 2014-12-10 ENCOUNTER — Telehealth: Payer: Self-pay | Admitting: *Deleted

## 2014-12-10 NOTE — Telephone Encounter (Signed)
Oncology Nurse Navigator Documentation  Oncology Nurse Navigator Flowsheets 12/10/2014  Navigator Encounter Type Telephone/called to follow up from thoracic clinic on 12/04/14.  I was unable to reach and left a vm message with my name and phone number to call if needed  Treatment Phase Abnormal Scans  Time Spent with Patient 15

## 2014-12-11 ENCOUNTER — Other Ambulatory Visit: Payer: Self-pay | Admitting: Internal Medicine

## 2014-12-11 ENCOUNTER — Ambulatory Visit (HOSPITAL_COMMUNITY)
Admission: RE | Admit: 2014-12-11 | Discharge: 2014-12-11 | Disposition: A | Discharge status: Home / Self Care | Payer: Commercial Managed Care - HMO | Source: Ambulatory Visit | Attending: Internal Medicine | Admitting: Internal Medicine

## 2014-12-11 ENCOUNTER — Encounter: Payer: Commercial Managed Care - HMO | Admitting: Internal Medicine

## 2014-12-11 DIAGNOSIS — C3492 Malignant neoplasm of unspecified part of left bronchus or lung: Secondary | ICD-10-CM | POA: Diagnosis not present

## 2014-12-11 DIAGNOSIS — R51 Headache: Secondary | ICD-10-CM | POA: Diagnosis not present

## 2014-12-11 DIAGNOSIS — C3432 Malignant neoplasm of lower lobe, left bronchus or lung: Secondary | ICD-10-CM | POA: Diagnosis not present

## 2014-12-11 MED ORDER — IOHEXOL 300 MG/ML  SOLN
100.0000 mL | Freq: Once | INTRAMUSCULAR | Status: AC | PRN
Start: 2014-12-11 — End: 2014-12-11
  Administered 2014-12-11: 100 mL via INTRAVENOUS

## 2014-12-12 ENCOUNTER — Encounter (HOSPITAL_COMMUNITY)
Admission: RE | Admit: 2014-12-12 | Discharge: 2014-12-12 | Disposition: A | Discharge status: Home / Self Care | Payer: Commercial Managed Care - HMO | Source: Ambulatory Visit | Attending: Cardiothoracic Surgery | Admitting: Cardiothoracic Surgery

## 2014-12-12 ENCOUNTER — Encounter (HOSPITAL_COMMUNITY): Payer: Self-pay

## 2014-12-12 ENCOUNTER — Ambulatory Visit: Payer: Commercial Managed Care - HMO | Admitting: Internal Medicine

## 2014-12-12 VITALS — BP 122/71 | HR 97 | Temp 98.6°F | Resp 18 | Ht 64.0 in | Wt 260.1 lb

## 2014-12-12 DIAGNOSIS — Z9981 Dependence on supplemental oxygen: Secondary | ICD-10-CM | POA: Diagnosis not present

## 2014-12-12 DIAGNOSIS — I2510 Atherosclerotic heart disease of native coronary artery without angina pectoris: Secondary | ICD-10-CM | POA: Diagnosis not present

## 2014-12-12 DIAGNOSIS — Z955 Presence of coronary angioplasty implant and graft: Secondary | ICD-10-CM | POA: Diagnosis not present

## 2014-12-12 DIAGNOSIS — E785 Hyperlipidemia, unspecified: Secondary | ICD-10-CM | POA: Diagnosis not present

## 2014-12-12 DIAGNOSIS — I5032 Chronic diastolic (congestive) heart failure: Secondary | ICD-10-CM | POA: Diagnosis not present

## 2014-12-12 DIAGNOSIS — C3490 Malignant neoplasm of unspecified part of unspecified bronchus or lung: Secondary | ICD-10-CM | POA: Diagnosis not present

## 2014-12-12 DIAGNOSIS — R222 Localized swelling, mass and lump, trunk: Secondary | ICD-10-CM | POA: Diagnosis not present

## 2014-12-12 DIAGNOSIS — Z01818 Encounter for other preprocedural examination: Secondary | ICD-10-CM | POA: Diagnosis not present

## 2014-12-12 HISTORY — DX: Presence of dental prosthetic device (complete) (partial): Z97.2

## 2014-12-12 HISTORY — DX: Unspecified osteoarthritis, unspecified site: M19.90

## 2014-12-12 HISTORY — DX: Major depressive disorder, single episode, unspecified: F32.9

## 2014-12-12 HISTORY — DX: Pneumonia, unspecified organism: J18.9

## 2014-12-12 HISTORY — DX: Dependence on supplemental oxygen: Z99.81

## 2014-12-12 HISTORY — DX: Depression, unspecified: F32.A

## 2014-12-12 HISTORY — DX: Complete loss of teeth, unspecified cause, unspecified class: K08.109

## 2014-12-12 LAB — CBC
HCT: 39.6 % (ref 36.0–46.0)
Hemoglobin: 12.6 g/dL (ref 12.0–15.0)
MCH: 32.6 pg (ref 26.0–34.0)
MCHC: 31.8 g/dL (ref 30.0–36.0)
MCV: 102.6 fL — ABNORMAL HIGH (ref 78.0–100.0)
Platelets: 212 10*3/uL (ref 150–400)
RBC: 3.86 MIL/uL — ABNORMAL LOW (ref 3.87–5.11)
RDW: 16.2 % — ABNORMAL HIGH (ref 11.5–15.5)
WBC: 9.3 10*3/uL (ref 4.0–10.5)

## 2014-12-12 LAB — COMPREHENSIVE METABOLIC PANEL
ALT: 8 U/L — ABNORMAL LOW (ref 14–54)
AST: 16 U/L (ref 15–41)
Albumin: 2.7 g/dL — ABNORMAL LOW (ref 3.5–5.0)
Alkaline Phosphatase: 140 U/L — ABNORMAL HIGH (ref 38–126)
Anion gap: 5 (ref 5–15)
BUN: <5 mg/dL — ABNORMAL LOW (ref 6–20)
CO2: 39 mmol/L — ABNORMAL HIGH (ref 22–32)
Calcium: 8.7 mg/dL — ABNORMAL LOW (ref 8.9–10.3)
Chloride: 94 mmol/L — ABNORMAL LOW (ref 101–111)
Creatinine, Ser: 0.71 mg/dL (ref 0.44–1.00)
GFR calc Af Amer: >60 mL/min (ref >60)
GFR calc non Af Amer: >60 mL/min (ref >60)
Glucose, Bld: 118 mg/dL — ABNORMAL HIGH (ref 65–99)
Potassium: 3.5 mmol/L (ref 3.5–5.1)
Sodium: 138 mmol/L (ref 135–145)
Total Bilirubin: 0.6 mg/dL (ref 0.3–1.2)
Total Protein: 6.6 g/dL (ref 6.5–8.1)

## 2014-12-12 LAB — APTT: aPTT: 28 seconds (ref 24–37)

## 2014-12-12 LAB — PROTIME-INR
INR: 1.06 (ref 0.00–1.49)
Prothrombin Time: 14 seconds (ref 11.6–15.2)

## 2014-12-12 NOTE — Progress Notes (Signed)
  Patient has stopped her aspirin 3/4 days ago per TCTS instructions.

## 2014-12-12 NOTE — Progress Notes (Signed)
Anesthesia Chart Review:  Pt is 59 year old female scheduled for video bronchoscopy with endobronchial ultrasound on 12/15/2014 with Dr. Prescott Gum.   Pulmonologist is Dr. Asencion Noble. Cardiologist is Dr. Percival Spanish.   PMH includes: CAD (s/p PCI of LAD with DES (12/2008). Total occlusion of RCA noted at that time, medically managed), pulmonary HTN, CHF, diastolic dysfunction, severe COPD (requires 4L 02), prediabetes, hyperlipidemia, GERD. Current smoker. BMI 45.   Medications include: albuterol, ASA, lasix, lovastatin, protonix, potassium, spiriva.   Preoperative labs pending.    Chest x-ray 10/29/2014 reviewed.  1. No acute findings. 2. Persistent left lower lobe lung mass worrisome for malignancy.  CXR 12/12/2014 is pending.   EKG 10/29/2014: Sinus tachycardia (108 bpm). Atrial premature complex. Probable RVH w/ secondary repol abnormality. EKG in the setting of hypokalemia.   Nuclear stress test 07/01/2014: 1. No reversible ischemia or infarction. 2. Normal left ventricular wall motion. 3. Left ventricular ejection fraction 63% 4. Low-risk stress test findings  Echo 05/25/2014:  - Left ventricle: The cavity size was normal. Wall thickness was increased in a pattern of mild LVH. Systolic function was normal. The estimated ejection fraction was in the range of 50% to 55%. Regional wall motion abnormalities cannot be excluded. Doppler parameters are consistent with abnormal left ventricular relaxation (grade 1 diastolic dysfunction). - Aortic valve: Valve area (Vmax): 2.59 cm^2. - Left atrium: The atrium was mildly dilated. - Right atrium: The atrium was mildly dilated. - Pericardium, extracardiac: A trivial pericardial effusion was identified posterior to the heart.  R heart cath 11/09/2012: -Mild to moderate pulmonary arterial hypertension, normal PCWP. I suspect that the pulmonary arterial HTN is due to a combination of COPD with hypoxemia and possible OSA. She will need sleep study as  outpatient and will need to continue home oxygen. Given normal PCWP, can transition to po Lasix today.   Cardiac cath 12/12/2008: 1. Severe LAD stenosis with successful PCI with DES 2. Total occlusion of RCA with robust left-to-right collaterals 3. Mild L CX stenosis 4. Severe pulmonary HTN in the setting of normal LV end diastolic pressure.   If no changes, I anticipate pt can proceed with surgery as scheduled.   Willeen Cass, FNP-BC [facility]/Anesthesiology Phone: [phone] 12/12/2014 2:26 PM

## 2014-12-12 NOTE — Progress Notes (Signed)
12/12/14 1342  OBSTRUCTIVE SLEEP APNEA  Have you ever been diagnosed with sleep apnea through a sleep study? No  Do you snore loudly (loud enough to be heard through closed doors)?  1  Do you often feel tired, fatigued, or sleepy during the daytime (such as falling asleep during driving or talking to someone)? 1  Has anyone observed you stop breathing during your sleep? 0  Do you have, or are you being treated for high blood pressure? 0  BMI more than 35 kg/m2? 1  Age > 50 (1-yes) 1  Neck circumference greater than:Female 16 inches or larger, Female 17inches or larger? 1 (29.5)  Female Gender (Yes=1) 0  Obstructive Sleep Apnea Score 5  Score 5 or greater  Results sent to PCP

## 2014-12-12 NOTE — Pre-Procedure Instructions (Signed)
[name]  12/12/2014      [id number] - [place], [place] - [address] AT [place] [place] [address] Phone: [phone] Fax: [phone]  [facility] [place], [place] [address] Phone: [phone] Fax: [phone]    Your procedure is scheduled on September 12th 2016 at 1115 am.  Report to [place] at 915 A.M.  Call this number if you have problems the morning of surgery:  [phone]   Call this number if you have problems in the days leading up to your surgery:  [phone]    Remember:  Do not eat food or drink liquids after midnight Sunday Sept 11th.  Take these medicines the morning of surgery with A SIP OF WATER Use your inhalers, bring albuterol inhaler with you.    STOP: ALL Vitamins, Supplements, Effient and Herbal Medications, Fish Oils, Aspirins, NSAIDs (Nonsteroidal Anti-inflammatories such as Ibuprofen, Aleve, or Advil), and Goody's/BC Powders 7 days prior to surgery, until after surgery as directed by your physician.     Do not wear jewelry, make-up or nail polish.  Do not wear lotions, powders, or perfumes.  You may wear deodorant.  Do not shave 48 hours prior to surgery.  Men may shave face and neck.  Do not bring valuables to the hospital.  [facility] is not responsible for any belongings or valuables.  Contacts, dentures or bridgework may not be worn into surgery.  Leave your suitcase in the car.  After surgery it may be brought to your room.  For patients admitted to the hospital, discharge time will be determined by your treatment team.  Patients discharged the day of surgery will not be allowed to drive home.   Name and phone number of your driver:   [name] (sister) [phone] Special instructions: Please follow these instructions carefully:  1. Shower with CHG Soap  the night before surgery and the morning of Surgery. 2. If you choose to wash your hair, wash your hair first as usual with your normal shampoo. 3. After you shampoo, rinse your hair and body thoroughly to remove the Shampoo. 4. Use CHG as you would any other liquid soap. You can apply chg directly to the skin and wash gently with scrungie or a clean washcloth. 5. Apply the CHG Soap to your body ONLY FROM THE NECK DOWN. Do not use on open wounds or open sores. Avoid contact with your eyes, ears, mouth and genitals (private parts). Wash genitals (private parts) with your normal soap. 6. Wash thoroughly, paying special attention to the area where your surgery will be performed. 7. Thoroughly rinse your body with warm water from the neck down. 8. DO NOT shower/wash with your normal soap after using and rinsing off the CHG Soap. 9. Pat yourself dry with a clean towel.  10. Wear clean pajamas.  11. Place clean sheets on your bed the night of your first shower and do not sleep with pets.  Day of Surgery  Do not apply any lotions/deodorants the morning of surgery. Please wear clean clothes to the hospital/surgery center.    Please read over the following fact sheets that you were given. Pain Booklet, Coughing and Deep Breathing and Surgical Site Infection Prevention

## 2014-12-12 NOTE — Progress Notes (Signed)
Patient wears 4L of oxygen at home, all the time.  Patient follows with Dr. Joya Gaskins for pulmonology, and Dr. Percival Spanish for Cardiology.  PCP is Dr. Eppie Gibson.  Cath: 2010 ([facility]) R Heart Stent  ECHO: 05/25/14 EPIC  Stress: 07/01/2014 EPIC  EKG: 10/30/14 EPIC

## 2014-12-15 ENCOUNTER — Ambulatory Visit (HOSPITAL_COMMUNITY): Payer: Commercial Managed Care - HMO

## 2014-12-15 ENCOUNTER — Encounter (HOSPITAL_COMMUNITY): Payer: Self-pay | Admitting: *Deleted

## 2014-12-15 ENCOUNTER — Encounter: Payer: Commercial Managed Care - HMO | Admitting: *Deleted

## 2014-12-15 ENCOUNTER — Ambulatory Visit (HOSPITAL_COMMUNITY): Payer: Commercial Managed Care - HMO | Admitting: Emergency Medicine

## 2014-12-15 ENCOUNTER — Ambulatory Visit (HOSPITAL_COMMUNITY): Payer: Commercial Managed Care - HMO | Admitting: Anesthesiology

## 2014-12-15 ENCOUNTER — Ambulatory Visit (HOSPITAL_COMMUNITY)
Admission: RE | Admit: 2014-12-15 | Discharge: 2014-12-15 | Disposition: A | Discharge status: Home / Self Care | Payer: Commercial Managed Care - HMO | Source: Ambulatory Visit | Attending: Cardiothoracic Surgery | Admitting: Cardiothoracic Surgery

## 2014-12-15 ENCOUNTER — Encounter (HOSPITAL_COMMUNITY)
Admission: RE | Disposition: A | Discharge status: Home / Self Care | Payer: Self-pay | Source: Ambulatory Visit | Attending: Cardiothoracic Surgery

## 2014-12-15 DIAGNOSIS — R0602 Shortness of breath: Secondary | ICD-10-CM

## 2014-12-15 DIAGNOSIS — I2510 Atherosclerotic heart disease of native coronary artery without angina pectoris: Secondary | ICD-10-CM | POA: Insufficient documentation

## 2014-12-15 DIAGNOSIS — Z9889 Other specified postprocedural states: Secondary | ICD-10-CM | POA: Diagnosis not present

## 2014-12-15 DIAGNOSIS — J449 Chronic obstructive pulmonary disease, unspecified: Secondary | ICD-10-CM | POA: Diagnosis not present

## 2014-12-15 DIAGNOSIS — C3490 Malignant neoplasm of unspecified part of unspecified bronchus or lung: Secondary | ICD-10-CM | POA: Diagnosis not present

## 2014-12-15 DIAGNOSIS — C3432 Malignant neoplasm of lower lobe, left bronchus or lung: Secondary | ICD-10-CM | POA: Diagnosis not present

## 2014-12-15 DIAGNOSIS — K219 Gastro-esophageal reflux disease without esophagitis: Secondary | ICD-10-CM | POA: Diagnosis not present

## 2014-12-15 DIAGNOSIS — Z955 Presence of coronary angioplasty implant and graft: Secondary | ICD-10-CM | POA: Diagnosis not present

## 2014-12-15 DIAGNOSIS — Z9981 Dependence on supplemental oxygen: Secondary | ICD-10-CM | POA: Diagnosis not present

## 2014-12-15 DIAGNOSIS — I5032 Chronic diastolic (congestive) heart failure: Secondary | ICD-10-CM | POA: Insufficient documentation

## 2014-12-15 DIAGNOSIS — E785 Hyperlipidemia, unspecified: Secondary | ICD-10-CM | POA: Diagnosis not present

## 2014-12-15 HISTORY — PX: VIDEO BRONCHOSCOPY WITH ENDOBRONCHIAL ULTRASOUND: SHX6177

## 2014-12-15 SURGERY — BRONCHOSCOPY, WITH EBUS
Anesthesia: General

## 2014-12-15 MED ORDER — FENTANYL CITRATE (PF) 100 MCG/2ML IJ SOLN: 25.0000 ug | INTRAMUSCULAR | Status: DC | PRN

## 2014-12-15 MED ORDER — LACTATED RINGERS IV SOLN
INTRAVENOUS | Status: DC | PRN
  Administered 2014-12-15: 12:00:00 via INTRAVENOUS

## 2014-12-15 MED ORDER — ONDANSETRON HCL 4 MG/2ML IJ SOLN
INTRAMUSCULAR | Status: DC | PRN
  Administered 2014-12-15: 4 mg via INTRAVENOUS

## 2014-12-15 MED ORDER — LIDOCAINE HCL 4 % MT SOLN
OROMUCOSAL | Status: DC | PRN
  Administered 2014-12-15: 4 mL via TOPICAL

## 2014-12-15 MED ORDER — LACTATED RINGERS IV SOLN
INTRAVENOUS | Status: DC
  Administered 2014-12-15: 09:00:00 via INTRAVENOUS

## 2014-12-15 MED ORDER — REMIFENTANIL HCL 2 MG IV SOLR
INTRAVENOUS | Status: DC | PRN
  Administered 2014-12-15: .5 ug/kg/min via INTRAVENOUS

## 2014-12-15 MED ORDER — SODIUM CHLORIDE 0.9 % IJ SOLN: 3.0000 mL | INTRAMUSCULAR | Status: DC | PRN

## 2014-12-15 MED ORDER — PROPOFOL 10 MG/ML IV BOLUS
INTRAVENOUS | Status: DC | PRN
  Administered 2014-12-15: 120 mg via INTRAVENOUS

## 2014-12-15 MED ORDER — SODIUM CHLORIDE 0.9 % IV SOLN: 250.0000 mL | INTRAVENOUS | Status: DC | PRN

## 2014-12-15 MED ORDER — SODIUM CHLORIDE 0.9 % IJ SOLN: 3.0000 mL | Freq: Two times a day (BID) | INTRAMUSCULAR | Status: DC

## 2014-12-15 MED ORDER — ACETAMINOPHEN 650 MG RE SUPP: 650.0000 mg | RECTAL | Status: DC | PRN

## 2014-12-15 MED ORDER — CEFAZOLIN SODIUM-DEXTROSE 2-3 GM-% IV SOLR
INTRAVENOUS | Status: DC | PRN
  Administered 2014-12-15: 2 g via INTRAVENOUS

## 2014-12-15 MED ORDER — ACETAMINOPHEN 325 MG PO TABS: 650.0000 mg | ORAL_TABLET | ORAL | Status: DC | PRN

## 2014-12-15 MED ORDER — ALBUTEROL SULFATE HFA 108 (90 BASE) MCG/ACT IN AERS: 2.0000 | INHALATION_SPRAY | Freq: Four times a day (QID) | RESPIRATORY_TRACT | Status: DC | PRN

## 2014-12-15 MED ORDER — PROMETHAZINE HCL 25 MG/ML IJ SOLN: 6.2500 mg | INTRAMUSCULAR | Status: DC | PRN

## 2014-12-15 MED ORDER — SUCCINYLCHOLINE CHLORIDE 20 MG/ML IJ SOLN
INTRAMUSCULAR | Status: DC | PRN
  Administered 2014-12-15: 100 mg via INTRAVENOUS

## 2014-12-15 MED ORDER — LIDOCAINE HCL (CARDIAC) 20 MG/ML IV SOLN
INTRAVENOUS | Status: DC | PRN
  Administered 2014-12-15: 60 mg via INTRAVENOUS

## 2014-12-15 MED ORDER — OXYCODONE HCL 5 MG PO TABS: 5.0000 mg | ORAL_TABLET | ORAL | Status: DC | PRN

## 2014-12-15 MED ORDER — ALBUTEROL SULFATE (2.5 MG/3ML) 0.083% IN NEBU: 2.5000 mg | INHALATION_SOLUTION | Freq: Four times a day (QID) | RESPIRATORY_TRACT | Status: DC | PRN

## 2014-12-15 SURGICAL SUPPLY — 29 items
BALL CTTN LRG ABS STRL LF (GAUZE/BANDAGES/DRESSINGS)
BRUSH CYTOL CELLEBRITY 1.5X140 (MISCELLANEOUS) IMPLANT
CANISTER SUCTION 2500CC (MISCELLANEOUS) ×3 IMPLANT
CONT SPEC 4OZ CLIKSEAL STRL BL (MISCELLANEOUS) ×3 IMPLANT
COTTONBALL LRG STERILE PKG (GAUZE/BANDAGES/DRESSINGS) IMPLANT
COVER TABLE BACK 60X90 (DRAPES) ×3 IMPLANT
FORCEPS BIOP RJ4 1.8 (CUTTING FORCEPS) IMPLANT
GAUZE SPONGE 4X4 12PLY STRL (GAUZE/BANDAGES/DRESSINGS) IMPLANT
GLOVE BIO SURGEON STRL SZ7.5 (GLOVE) ×3 IMPLANT
KIT CLEAN ENDO COMPLIANCE (KITS) ×6 IMPLANT
KIT ROOM TURNOVER OR (KITS) ×3 IMPLANT
MARKER SKIN DUAL TIP RULER LAB (MISCELLANEOUS) ×3 IMPLANT
NDL BIOPSY TRANSBRONCH 21G (NEEDLE) IMPLANT
NDL BLUNT 18X1 FOR OR ONLY (NEEDLE) IMPLANT
NEEDLE 22X1 1/2 (OR ONLY) (NEEDLE) IMPLANT
NEEDLE BIOPSY TRANSBRONCH 21G (NEEDLE) IMPLANT
NEEDLE BLUNT 18X1 FOR OR ONLY (NEEDLE) IMPLANT
NEEDLE SONO TIP II EBUS (NEEDLE) ×3 IMPLANT
NS IRRIG 1000ML POUR BTL (IV SOLUTION) ×3 IMPLANT
OIL SILICONE PENTAX (PARTS (SERVICE/REPAIRS)) ×3 IMPLANT
PAD ARMBOARD 7.5X6 YLW CONV (MISCELLANEOUS) ×6 IMPLANT
SYR 20CC LL (SYRINGE) ×3 IMPLANT
SYR 20ML ECCENTRIC (SYRINGE) ×3 IMPLANT
SYR 5ML LUER SLIP (SYRINGE) ×3 IMPLANT
SYR CONTROL 10ML LL (SYRINGE) IMPLANT
TOWEL OR 17X26 10 PK STRL BLUE (TOWEL DISPOSABLE) ×3 IMPLANT
TRAP SPECIMEN MUCOUS 40CC (MISCELLANEOUS) ×3 IMPLANT
TUBE CONNECTING 20'X1/4 (TUBING) ×2
TUBE CONNECTING 20X1/4 (TUBING) ×4 IMPLANT

## 2014-12-15 NOTE — Addendum Note (Signed)
Addendum  created 12/15/14 1445 by Kerby Less, CRNA   Modules edited: Anesthesia Medication Administration

## 2014-12-15 NOTE — Progress Notes (Signed)
Oncology Nurse Navigator Documentation  Oncology Nurse Navigator Flowsheets 12/15/2014  Navigator Encounter Type Other/I received a phone call from Dr. Darcey Nora today.  He states patient has social concerns with living situation and transportation.  I looked in chart and noticed that CSW completed SCAT forms and gave patient information on road to recovery.  Lauren will follow up with patient this Thursday at [facility] and I will call tomorrow.    Treatment Phase Pre tx  Coordination of Care Other  Time Spent with Patient 15

## 2014-12-15 NOTE — Progress Notes (Signed)
The patient was examined and preop studies reviewed. There has been no change from the prior exam and the patient is ready for surgery.   Plan bronchoscopy and Endobronchial biopsy of mediastinal nodes today

## 2014-12-15 NOTE — Transfer of Care (Signed)
Immediate Anesthesia Transfer of Care Note  Patient: [name]  Procedure(s) Performed: Procedure(s): VIDEO BRONCHOSCOPY WITH ENDOBRONCHIAL ULTRASOUND (N/A)  Patient Location: PACU  Anesthesia Type:General  Level of Consciousness: awake, alert , patient cooperative and responds to stimulation  Airway & Oxygen Therapy: Patient Spontanous Breathing and Patient connected to nasal cannula oxygen  Post-op Assessment: Report given to RN, Post -op Vital signs reviewed and stable and Patient moving all extremities  Post vital signs: Reviewed and stable  Last Vitals:  Filed Vitals:   12/15/14 0909  BP: 120/70  Pulse: 97  Temp: 37.1 C  Resp: 18    Complications: No apparent anesthesia complications

## 2014-12-15 NOTE — Anesthesia Procedure Notes (Signed)
Procedure Name: Intubation Date/Time: 12/15/2014 11:52 AM Performed by: Rebekah Chesterfield L Pre-anesthesia Checklist: Patient identified, Emergency Drugs available, Suction available, Patient being monitored and Timeout performed Patient Re-evaluated:Patient Re-evaluated prior to inductionOxygen Delivery Method: Circle system utilized Preoxygenation: Pre-oxygenation with 100% oxygen Intubation Type: IV induction and Cricoid Pressure applied Ventilation: Mask ventilation with difficulty and Oral airway inserted - appropriate to patient size Laryngoscope Size: Mac and 3 Grade View: Grade II Tube type: Oral Tube size: 8.5 mm Number of attempts: 1 Airway Equipment and Method: Stylet Placement Confirmation: ETT inserted through vocal cords under direct vision,  breath sounds checked- equal and bilateral and positive ETCO2 Secured at: 20 cm Tube secured with: Tape Dental Injury: Teeth and Oropharynx as per pre-operative assessment

## 2014-12-15 NOTE — Anesthesia Postprocedure Evaluation (Signed)
  Anesthesia Post-op Note  Patient: [name]  Procedure(s) Performed: Procedure(s): VIDEO BRONCHOSCOPY WITH ENDOBRONCHIAL ULTRASOUND (N/A)  Patient Location: PACU  Anesthesia Type:General  Level of Consciousness: awake and alert   Airway and Oxygen Therapy: Patient Spontanous Breathing  Post-op Pain: none  Post-op Assessment: Post-op Vital signs reviewed              Post-op Vital Signs: Reviewed  Last Vitals:  Filed Vitals:   12/15/14 1345  BP: 144/70  Pulse:   Temp:   Resp: 18    Complications: No apparent anesthesia complications

## 2014-12-15 NOTE — Brief Op Note (Signed)
12/15/2014  12:37 PM  PATIENT:  Michelene Gardener  59 y.o. female  PRE-OPERATIVE DIAGNOSIS:  LUNG CANCER  POST-OPERATIVE DIAGNOSIS:  LUNG CANCER  PROCEDURE:  Procedure(s): VIDEO BRONCHOSCOPY WITH ENDOBRONCHIAL ULTRASOUND (N/A)  aspiration of 4 L node  SURGEON:  Surgeon(s) and Role:    * Ivin Poot, MD - Primary  PHYSICIAN ASSISTANT:   ASSISTANTS: none   ANESTHESIA:   general  EBL:     BLOOD ADMINISTERED:none  DRAINS: none   LOCAL MEDICATIONS USED:  NONE  SPECIMEN:  Fine Needle Aspirate  DISPOSITION OF SPECIMEN:  PATHOLOGY  COUNTS:  YES  TOURNIQUET:  * No tourniquets in log *  DICTATION: .Dragon Dictation  PLAN OF CARE: Discharge to home after PACU  PATIENT DISPOSITION:  PACU - hemodynamically stable.   Delay start of Pharmacological VTE agent (>24hrs) due to surgical blood loss or risk of bleeding: yes

## 2014-12-15 NOTE — Anesthesia Preprocedure Evaluation (Addendum)
Anesthesia Evaluation  Patient identified by MRN, date of birth, ID band Patient awake    Reviewed: Allergy & Precautions, NPO status , Patient's Chart, lab work & pertinent test results  Airway Mallampati: I  TM Distance: >3 FB Neck ROM: Full    Dental  (+) Dental Advisory Given   Pulmonary shortness of breath, sleep apnea , COPD,  oxygen dependent, Current Smoker,    breath sounds clear to auscultation       Cardiovascular hypertension, Pt. on medications + CAD, + Cardiac Stents and +CHF   Rhythm:Regular Rate:Normal     Neuro/Psych Depression negative neurological ROS     GI/Hepatic Neg liver ROS, GERD  ,  Endo/Other  Morbid obesity  Renal/GU negative Renal ROS     Musculoskeletal  (+) Arthritis ,   Abdominal   Peds  Hematology negative hematology ROS (+)   Anesthesia Other Findings   Reproductive/Obstetrics                            Anesthesia Physical Anesthesia Plan  ASA: III  Anesthesia Plan: General   Post-op Pain Management:    Induction: Intravenous  Airway Management Planned: Oral ETT  Additional Equipment:   Intra-op Plan:   Post-operative Plan: Extubation in OR and Possible Post-op intubation/ventilation  Informed Consent: I have reviewed the patients History and Physical, chart, labs and discussed the procedure including the risks, benefits and alternatives for the proposed anesthesia with the patient or authorized representative who has indicated his/her understanding and acceptance.   Dental advisory given  Plan Discussed with: CRNA  Anesthesia Plan Comments:         Anesthesia Quick Evaluation

## 2014-12-16 ENCOUNTER — Encounter (HOSPITAL_COMMUNITY): Payer: Self-pay | Admitting: Cardiothoracic Surgery

## 2014-12-16 NOTE — Op Note (Signed)
[name], [name] NO.:  [id number]  MEDICAL RECORD NO.:  [id number]  LOCATION:  [facility]                         FACILITY:  [facility]  PHYSICIAN:  Ivin Poot, M.D.  DATE OF BIRTH:  [birth year]  DATE OF PROCEDURE:  12/15/2014 DATE OF DISCHARGE:  12/15/2014                              OPERATIVE REPORT   OPERATION: 1. Video bronchoscopy. 2. Transbronchial ultrasound-guided aspiration of mediastinal lymph     node, 4L.  SURGEON:  Ivin Poot, M.D.  ANESTHESIA:  General.  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  DESCRIPTION OF PROCEDURE:  The patient was brought to the operating room and placed supine on the operating room table.  After the procedure had been described in full and a proper informed consent had been obtained, general anesthesia was induced and the procedure was done via the endotracheal tube.  A proper time-out was performed.  A video bronchoscope was passed down to the distal carina and the mainstem bronchus on the left side.  There were no endobronchial lesions in the left mainstem bronchus, left upper lobe and left lower lobe segmental orifices.  The bronchoscope was then passed down the right mainstem bronchus, and the endobronchial segments of the right upper lobe, right middle lobe, and right lower lobe were examined and no endobronchial lesions.  The bronchoscope was withdrawn.  The EBUS scope was then inserted to the orifice of the left mainstem bronchus and using the ultrasound transducer, the mediastinal nodes and the 4 L station were identified.  Several transbronchial needle aspirates were performed measuring at least 12.  Quick prep on the initial passes was negative for cancer.  Final preparation on  the remaining cytology aspirates submitted and are pending.  There was no significant bleeding, and the scope was removed.  The patient was extubated and returned to recovery room in stable condition.     Ivin Poot, M.D.     PV/MEDQ  D:  12/15/2014  T:  12/16/2014  Job:  [id number]

## 2014-12-17 ENCOUNTER — Ambulatory Visit: Payer: Commercial Managed Care - HMO | Admitting: Critical Care Medicine

## 2014-12-17 ENCOUNTER — Other Ambulatory Visit: Payer: Self-pay | Admitting: *Deleted

## 2014-12-18 ENCOUNTER — Encounter: Payer: Self-pay | Admitting: Internal Medicine

## 2014-12-18 ENCOUNTER — Telehealth: Payer: Self-pay | Admitting: Internal Medicine

## 2014-12-18 ENCOUNTER — Encounter (HOSPITAL_COMMUNITY): Payer: Self-pay

## 2014-12-18 ENCOUNTER — Ambulatory Visit (HOSPITAL_BASED_OUTPATIENT_CLINIC_OR_DEPARTMENT_OTHER): Payer: Commercial Managed Care - HMO | Admitting: Internal Medicine

## 2014-12-18 ENCOUNTER — Telehealth: Payer: Self-pay | Admitting: *Deleted

## 2014-12-18 VITALS — BP 130/51 | HR 102 | Temp 99.3°F | Resp 19 | Ht 64.0 in | Wt 258.1 lb

## 2014-12-18 DIAGNOSIS — C3492 Malignant neoplasm of unspecified part of left bronchus or lung: Secondary | ICD-10-CM

## 2014-12-18 NOTE — Progress Notes (Signed)
[facility] [phone]   [phone]  OFFICE PROGRESS NOTE  Karren Cobble, MD [address] [place] [address]  DIAGNOSIS: Stage IIA (T2b, N0, M0) non-small cell lung cancer, adenocarcinoma with negative EGFR mutation and negative ALK gene translocation, diagnosed in August 2016 and presented with left lower lobe lung mass.   PRIOR THERAPY: None  CURRENT THERAPY: The patient is expected to undergo curative radiotherapy to the left lower lobe lung mass under the care of Dr. Pablo Ledger.  INTERVAL HISTORY: [name] 59 y.o. female returns to the clinic today for follow-up visit. The patient is feeling fine today with no specific complaints. She denied having any significant chest pain, shortness breath, cough or hemoptysis. She has no weight loss or night sweats. She has no nausea or vomiting. She was seen recently by Dr. Prescott Gum and the patient underwent repeat bronchoscopy with EBUS and biopsies of the suspicious mediastinal lymph nodes. The fine-needle aspiration on 4L and 4R lymph nodes showed no malignant cells identified. The patient is here today for reevaluation and discussion of her treatment options.  MEDICAL HISTORY: Past Medical History  Diagnosis Date  . Coronary artery disease     S/P PCI of LAD with DES (12/2008). Total occlusion of RCA noted at that time., medically managed. ACS ruled out 03/2009 with Lexiscan myoview . Followed by [facility].  . Pulmonary hypertension     2-D Echo (23/3007) - Systolic pressure was moderately increased. PA peak pressure  44mHg. 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 Followed by Dr. WJoya Gaskins(Velora Heckler  . Diastolic dysfunction     2-D Echo (12/2008) - Normal LV Systolic funciton with EF 60-65%. Grade 1 diastolid dysfunction. No regional wall motion abnormalities. Moderate pulmonary HTN with PA  peak pressure 5105mg.  . Marland KitchenOPD (chronic obstructive pulmonary disease)     Severe. Gold Stage IV.  PFTs (12/2008) - severe obstructive airway disease. Active tobacco use. Requires 4L O2 at home.  . Pulmonary nodule, right     Small right middle lobe nodule. Stable as of 12/2008.  . Marland Kitchenrediabetes 12/2008    HgbA1c 6.4 (12/2008)  . Hx MRSA infection     Recurrent MRSA thigh abscesses.  . Tobacco abuse     Ongoing.  . Obesity   . Hyperlipidemia   . GERD (gastroesophageal reflux disease)     S/P Nissen fundoplication.  . CHF (congestive heart failure)   . H/O right coronary artery stent placement 2010  . On home oxygen therapy since 2010    4L all the time  . Shortness of breath dyspnea     with a lot of exertion; if fluid builds up  . Pneumonia   . Depression   . Arthritis   . Cancer     lung cancer  . Full dentures     ALLERGIES:  is allergic to fluconazole and atorvastatin.  MEDICATIONS:  Current Outpatient Prescriptions  Medication Sig Dispense Refill  . albuterol (PROVENTIL) (2.5 MG/3ML) 0.083% nebulizer solution Take 3 mLs (2.5 mg total) by nebulization every 6 (six) hours as needed for shortness of breath. 360 mL 3  . albuterol (VENTOLIN HFA) 108 (90 BASE) MCG/ACT inhaler Inhale 2 puffs into the lungs every 6 (six) hours as needed for shortness of breath. 18 g 11  . ALPRAZolam (XANAX) 0.5 MG tablet Take 1 tablet prior to  PET scan (Patient not taking: Reported on 12/09/2014) 1 tablet 0  . aspirin 81 MG chewable tablet Chew 1 tablet (81 mg total) by mouth daily. (Patient taking differently: Chew 81 mg by mouth at bedtime. ) 30 tablet 11  . Aspirin-Salicylamide-Caffeine (BC HEADACHE PO) Take 1 Package by mouth daily as needed (pain).    . Fluticasone-Salmeterol (ADVAIR DISKUS) 250-50 MCG/DOSE AEPB Inhale 1 puff into the lungs 2 (two) times daily. 60 each 5  . furosemide (LASIX) 40 MG tablet Take 1 tablet (40 mg total) by mouth 2 (two) times daily. 60 tablet 6  . lovastatin  (MEVACOR) 40 MG tablet Take 1 tablet (40 mg total) by mouth at bedtime. 30 tablet 5  . pantoprazole (PROTONIX) 40 MG tablet Take 1 tablet (40 mg total) by mouth daily. 90 tablet 3  . Potassium Chloride ER 20 MEQ TBCR Take 40 mEq by mouth daily. (Patient taking differently: Take 20 mEq by mouth 2 (two) times daily. ) 30 tablet 1  . tiotropium (SPIRIVA HANDIHALER) 18 MCG inhalation capsule Place 1 capsule (18 mcg total) into inhaler and inhale daily. 30 capsule 12   No current facility-administered medications for this visit.    SURGICAL HISTORY:  Past Surgical History  Procedure Laterality Date  . Total abdominal hysterectomy w/ bilateral salpingoophorectomy    . Nissen fundoplication    . Right heart catheterization N/A 11/09/2012    Procedure: RIGHT HEART CATH;  Surgeon: Larey Dresser, MD;  Location[facility] CATH LAB;  Service: Cardiovascular;  Laterality: N/A;  . Appendectomy    . Tonsillectomy    . Back surgery  [facility], Dr. Lorin Mercy  . Breast surgery  25 years ago    Lumpectomy (biopsy negative)  . Cardiac catheterization  2010    [facility]  . Abdominal hysterectomy    . Knee cartilage surgery Right 10 years ago approx  . Tubal ligation  1979  . Video bronchoscopy with endobronchial ultrasound N/A 12/15/2014    Procedure: VIDEO BRONCHOSCOPY WITH ENDOBRONCHIAL ULTRASOUND;  Surgeon: Ivin Poot, MD;  Location: [facility] OR;  Service: Thoracic;  Laterality: N/A;    REVIEW OF SYSTEMS:  Constitutional: negative Eyes: negative Ears, nose, mouth, throat, and face: negative Respiratory: negative Cardiovascular: negative Gastrointestinal: negative Genitourinary:negative Integument/breast: negative Hematologic/lymphatic: negative Musculoskeletal:negative Neurological: negative Behavioral/Psych: negative Endocrine: negative Allergic/Immunologic: negative   PHYSICAL EXAMINATION: General appearance: alert, cooperative and no distress Head: Normocephalic, without obvious abnormality,  atraumatic Neck: no adenopathy, no JVD, supple, symmetrical, trachea midline and thyroid not enlarged, symmetric, no tenderness/mass/nodules Lymph nodes: Cervical, supraclavicular, and axillary nodes normal. Resp: clear to auscultation bilaterally Back: symmetric, no curvature. ROM normal. No CVA tenderness. Cardio: regular rate and rhythm, S1, S2 normal, no murmur, click, rub or gallop GI: soft, non-tender; bowel sounds normal; no masses,  no organomegaly Extremities: extremities normal, atraumatic, no cyanosis or edema Neurologic: Alert and oriented X 3, normal strength and tone. Normal symmetric reflexes. Normal coordination and gait  ECOG PERFORMANCE STATUS: 1 - Symptomatic but completely ambulatory  Blood pressure 130/51, pulse 102, temperature 99.3 F (37.4 C), temperature source Oral, resp. rate 19, height _0  (1.626 m), weight 258 lb 1.6 oz (117.073 kg), SpO2 92 %.  LABORATORY DATA: Lab Results  Component Value Date   WBC 9.3 12/12/2014   HGB 12.6 12/12/2014   HCT 39.6 12/12/2014   MCV 102.6* 12/12/2014   PLT 212 12/12/2014      Chemistry      Component Value Date/Time  NA 138 12/12/2014 1402   K 3.5 12/12/2014 1402   CL 94* 12/12/2014 1402   CO2 39* 12/12/2014 1402   BUN <5* 12/12/2014 1402   CREATININE 0.71 12/12/2014 1402   CREATININE 0.87 10/08/2014 1031      Component Value Date/Time   CALCIUM 8.7* 12/12/2014 1402   ALKPHOS 140* 12/12/2014 1402   AST 16 12/12/2014 1402   ALT 8* 12/12/2014 1402   BILITOT 0.6 12/12/2014 1402       RADIOGRAPHIC STUDIES: Dg Chest 2 View  12/12/2014   CLINICAL DATA:  Preoperative evaluation for left lung mass.  EXAM: CHEST  2 VIEW  COMPARISON:  11/18/2014  FINDINGS: Again noted is a round mass in the left lower chest, measuring over 4 cm. Heart size is slightly enlarged. Prominent lung markings are unchanged. No evidence for pulmonary edema or focal airspace disease. Multiple surgical clips in the upper abdomen. Trachea is  midline. No large pleural effusions. Negative for a pneumothorax.  IMPRESSION: Left lung mass without acute chest findings.   Electronically Signed   By: Markus Daft M.D.   On: 12/12/2014 15:47   Ct Head W Wo Contrast  12/11/2014   CLINICAL DATA:  Newly diagnosed left lower lobe non-small-cell lung cancer. Chronic headaches. Staging.  EXAM: CT HEAD WITHOUT AND WITH CONTRAST  TECHNIQUE: Contiguous axial images were obtained from the base of the skull through the vertex without and with intravenous contrast  CONTRAST:  175m OMNIPAQUE IOHEXOL 300 MG/ML  SOLN  COMPARISON:  Noncontrast head CT 06/30/2013  FINDINGS: There is no evidence of acute cortical infarct, intracranial hemorrhage, mass, midline shift, or extra-axial fluid collection. Ventricles and sulci are normal. Scattered areas of hypoattenuation in the subcortical and deep cerebral white matter bilaterally have mildly progressed from the prior study and are nonspecific but compatible with mild chronic small vessel ischemic disease. No abnormal enhancement is identified.  Visualized orbits are unremarkable. Visualized paranasal sinuses and mastoid air cells are clear. No destructive skull lesion is seen. There is mild-to-moderate calcified atherosclerosis at the skull base.  IMPRESSION: 1. No evidence of intracranial metastases. 2. Mild chronic small vessel ischemic disease.   Electronically Signed   By: ALogan BoresM.D.   On: 12/11/2014 08:41   Dg Chest Port 1 View  12/15/2014   CLINICAL DATA:  Status post bronchoscopy and left lung biopsy.  EXAM: PORTABLE CHEST - 1 VIEW  COMPARISON:  Chest x-ray 12/12/2014  FINDINGS: The heart is enlarged but stable. Persistent left lower lobe lung mass. Low lung volumes with vascular crowding and atelectasis. No postprocedural pneumothorax or pleural effusion.  IMPRESSION: No postprocedural pneumothorax or pleural effusion.   Electronically Signed   By: PMarijo SanesM.D.   On: 12/15/2014 13:29    ASSESSMENT AND  PLAN: This is a very pleasant 59years old white female recently diagnosed with a stage II a non-small cell lung cancer. Biopsy of the mediastinal lymph nodes showed no malignant cells. The patient is not a good surgical candidate for resection because of her poor pulmonary function and other comorbidities. I had a lengthy discussion with the patient today about her condition. I recommended for her to proceed with curative radiotherapy as recommended by Dr. WPablo Ledger I don't see a need for a concurrent chemotherapy with radiation as the patient may undergo few fractions of radiotherapy. I will arrange for the patient to have repeat CT scan of the chest in 4 months for reevaluation of her disease after the curative radiotherapy.  She was advised to call immediately if she has any concerning symptoms in the interval. The patient voices understanding of current disease status and treatment options and is in agreement with the current care plan.  All questions were answered. The patient knows to call the clinic with any problems, questions or concerns. We can certainly see the patient much sooner if necessary.  I spent 15 minutes counseling the patient face to face. The total time spent in the appointment was 25 minutes.  Disclaimer: This note was dictated with voice recognition software. Similar sounding words can inadvertently be transcribed and may not be corrected upon review.

## 2014-12-18 NOTE — Telephone Encounter (Signed)
Oncology Nurse Navigator Documentation  Oncology Nurse Navigator Flowsheets 12/18/2014  Navigator Encounter Type Clinic/MDC/I spoke with patient today with Dr. Julien Nordmann.  Patient will be receiving radiation treatment and then follow up with Dr. Julien Nordmann.  Patient expressed need for transportation.  I helped patient complete SCAT forms.  I will review with CSW and then fax.    Patient Visit Type Follow-up  Treatment Phase Abnormal Scans  Interventions Coordination of Care  Coordination of Care -  Time Spent with Patient 45

## 2014-12-18 NOTE — Telephone Encounter (Signed)
per pof to sch pt appt-gave pt copy of avs

## 2014-12-20 ENCOUNTER — Other Ambulatory Visit: Payer: Self-pay

## 2014-12-20 ENCOUNTER — Encounter (HOSPITAL_COMMUNITY): Payer: Self-pay | Admitting: *Deleted

## 2014-12-20 ENCOUNTER — Inpatient Hospital Stay (HOSPITAL_COMMUNITY)
Admission: EM | Admit: 2014-12-20 | Discharge: 2014-12-22 | DRG: 862 | Disposition: A | Discharge status: Home / Self Care | Payer: Medicare HMO | Attending: Internal Medicine | Admitting: Internal Medicine

## 2014-12-20 ENCOUNTER — Emergency Department (HOSPITAL_COMMUNITY): Payer: Medicare HMO

## 2014-12-20 DIAGNOSIS — I2510 Atherosclerotic heart disease of native coronary artery without angina pectoris: Secondary | ICD-10-CM | POA: Diagnosis present

## 2014-12-20 DIAGNOSIS — Z90710 Acquired absence of both cervix and uterus: Secondary | ICD-10-CM | POA: Diagnosis not present

## 2014-12-20 DIAGNOSIS — E538 Deficiency of other specified B group vitamins: Secondary | ICD-10-CM | POA: Diagnosis present

## 2014-12-20 DIAGNOSIS — F329 Major depressive disorder, single episode, unspecified: Secondary | ICD-10-CM | POA: Diagnosis present

## 2014-12-20 DIAGNOSIS — E785 Hyperlipidemia, unspecified: Secondary | ICD-10-CM | POA: Diagnosis present

## 2014-12-20 DIAGNOSIS — F17210 Nicotine dependence, cigarettes, uncomplicated: Secondary | ICD-10-CM | POA: Diagnosis present

## 2014-12-20 DIAGNOSIS — R197 Diarrhea, unspecified: Secondary | ICD-10-CM | POA: Diagnosis present

## 2014-12-20 DIAGNOSIS — M79605 Pain in left leg: Secondary | ICD-10-CM | POA: Diagnosis present

## 2014-12-20 DIAGNOSIS — I272 Other secondary pulmonary hypertension: Secondary | ICD-10-CM | POA: Diagnosis present

## 2014-12-20 DIAGNOSIS — Z6841 Body Mass Index (BMI) 40.0 and over, adult: Secondary | ICD-10-CM

## 2014-12-20 DIAGNOSIS — R509 Fever, unspecified: Secondary | ICD-10-CM | POA: Diagnosis not present

## 2014-12-20 DIAGNOSIS — J9612 Chronic respiratory failure with hypercapnia: Secondary | ICD-10-CM | POA: Diagnosis not present

## 2014-12-20 DIAGNOSIS — Z9981 Dependence on supplemental oxygen: Secondary | ICD-10-CM

## 2014-12-20 DIAGNOSIS — A419 Sepsis, unspecified organism: Secondary | ICD-10-CM

## 2014-12-20 DIAGNOSIS — D7589 Other specified diseases of blood and blood-forming organs: Secondary | ICD-10-CM | POA: Diagnosis present

## 2014-12-20 DIAGNOSIS — Z888 Allergy status to other drugs, medicaments and biological substances status: Secondary | ICD-10-CM

## 2014-12-20 DIAGNOSIS — K219 Gastro-esophageal reflux disease without esophagitis: Secondary | ICD-10-CM | POA: Diagnosis present

## 2014-12-20 DIAGNOSIS — R0602 Shortness of breath: Secondary | ICD-10-CM

## 2014-12-20 DIAGNOSIS — Y848 Other medical procedures as the cause of abnormal reaction of the patient, or of later complication, without mention of misadventure at the time of the procedure: Secondary | ICD-10-CM | POA: Diagnosis present

## 2014-12-20 DIAGNOSIS — R069 Unspecified abnormalities of breathing: Secondary | ICD-10-CM | POA: Diagnosis not present

## 2014-12-20 DIAGNOSIS — Z8249 Family history of ischemic heart disease and other diseases of the circulatory system: Secondary | ICD-10-CM

## 2014-12-20 DIAGNOSIS — B349 Viral infection, unspecified: Secondary | ICD-10-CM | POA: Diagnosis present

## 2014-12-20 DIAGNOSIS — Z955 Presence of coronary angioplasty implant and graft: Secondary | ICD-10-CM | POA: Diagnosis not present

## 2014-12-20 DIAGNOSIS — R51 Headache: Secondary | ICD-10-CM | POA: Diagnosis not present

## 2014-12-20 DIAGNOSIS — Z794 Long term (current) use of insulin: Secondary | ICD-10-CM

## 2014-12-20 DIAGNOSIS — M79671 Pain in right foot: Secondary | ICD-10-CM | POA: Diagnosis present

## 2014-12-20 DIAGNOSIS — R7309 Other abnormal glucose: Secondary | ICD-10-CM | POA: Diagnosis present

## 2014-12-20 DIAGNOSIS — C3490 Malignant neoplasm of unspecified part of unspecified bronchus or lung: Secondary | ICD-10-CM | POA: Diagnosis not present

## 2014-12-20 DIAGNOSIS — I959 Hypotension, unspecified: Secondary | ICD-10-CM | POA: Diagnosis not present

## 2014-12-20 DIAGNOSIS — I5032 Chronic diastolic (congestive) heart failure: Secondary | ICD-10-CM | POA: Diagnosis not present

## 2014-12-20 DIAGNOSIS — R05 Cough: Secondary | ICD-10-CM | POA: Diagnosis not present

## 2014-12-20 DIAGNOSIS — E669 Obesity, unspecified: Secondary | ICD-10-CM | POA: Diagnosis present

## 2014-12-20 DIAGNOSIS — M79604 Pain in right leg: Secondary | ICD-10-CM | POA: Diagnosis present

## 2014-12-20 DIAGNOSIS — E119 Type 2 diabetes mellitus without complications: Secondary | ICD-10-CM | POA: Diagnosis present

## 2014-12-20 DIAGNOSIS — C3492 Malignant neoplasm of unspecified part of left bronchus or lung: Secondary | ICD-10-CM | POA: Diagnosis present

## 2014-12-20 DIAGNOSIS — Z8614 Personal history of Methicillin resistant Staphylococcus aureus infection: Secondary | ICD-10-CM | POA: Diagnosis not present

## 2014-12-20 DIAGNOSIS — M79672 Pain in left foot: Secondary | ICD-10-CM | POA: Diagnosis present

## 2014-12-20 DIAGNOSIS — Z90722 Acquired absence of ovaries, bilateral: Secondary | ICD-10-CM | POA: Diagnosis present

## 2014-12-20 DIAGNOSIS — Z801 Family history of malignant neoplasm of trachea, bronchus and lung: Secondary | ICD-10-CM | POA: Diagnosis not present

## 2014-12-20 DIAGNOSIS — J449 Chronic obstructive pulmonary disease, unspecified: Secondary | ICD-10-CM

## 2014-12-20 DIAGNOSIS — G63 Polyneuropathy in diseases classified elsewhere: Secondary | ICD-10-CM

## 2014-12-20 DIAGNOSIS — I5030 Unspecified diastolic (congestive) heart failure: Secondary | ICD-10-CM | POA: Diagnosis present

## 2014-12-20 DIAGNOSIS — T814XXA Infection following a procedure, initial encounter: Secondary | ICD-10-CM | POA: Diagnosis not present

## 2014-12-20 LAB — CBC WITH DIFFERENTIAL/PLATELET
BASOS ABS: 0 10*3/uL (ref 0.0–0.1)
Basophils Relative: 0 %
EOS PCT: 1 %
Eosinophils Absolute: 0.2 10*3/uL (ref 0.0–0.7)
HCT: 39.8 % (ref 36.0–46.0)
Hemoglobin: 12.6 g/dL (ref 12.0–15.0)
LYMPHS ABS: 1.4 10*3/uL (ref 0.7–4.0)
LYMPHS PCT: 10 %
MCH: 32.9 pg (ref 26.0–34.0)
MCHC: 31.7 g/dL (ref 30.0–36.0)
MCV: 103.9 fL — AB (ref 78.0–100.0)
MONO ABS: 1.2 10*3/uL — AB (ref 0.1–1.0)
MONOS PCT: 8 %
Neutro Abs: 12.3 10*3/uL — ABNORMAL HIGH (ref 1.7–7.7)
Neutrophils Relative %: 81 %
PLATELETS: 247 10*3/uL (ref 150–400)
RBC: 3.83 MIL/uL — ABNORMAL LOW (ref 3.87–5.11)
RDW: 17 % — AB (ref 11.5–15.5)
WBC: 15.1 10*3/uL — ABNORMAL HIGH (ref 4.0–10.5)

## 2014-12-20 LAB — I-STAT TROPONIN, ED: Troponin i, poc: 0 ng/mL (ref 0.00–0.08)

## 2014-12-20 LAB — BASIC METABOLIC PANEL
Anion gap: 7 (ref 5–15)
CALCIUM: 8.8 mg/dL — AB (ref 8.9–10.3)
CO2: 33 mmol/L — ABNORMAL HIGH (ref 22–32)
Chloride: 97 mmol/L — ABNORMAL LOW (ref 101–111)
Creatinine, Ser: 0.75 mg/dL (ref 0.44–1.00)
GFR calc Af Amer: >60 mL/min (ref >60)
GLUCOSE: 165 mg/dL — AB (ref 65–99)
Potassium: 4.1 mmol/L (ref 3.5–5.1)
Sodium: 137 mmol/L (ref 135–145)

## 2014-12-20 LAB — I-STAT CG4 LACTIC ACID, ED: Lactic Acid, Venous: 3.6 mmol/L (ref 0.5–2.0)

## 2014-12-20 LAB — BRAIN NATRIURETIC PEPTIDE: B Natriuretic Peptide: 45.6 pg/mL (ref 0.0–100.0)

## 2014-12-20 MED ORDER — TIOTROPIUM BROMIDE MONOHYDRATE 18 MCG IN CAPS
18.0000 ug | ORAL_CAPSULE | Freq: Every day | RESPIRATORY_TRACT | Status: DC
  Administered 2014-12-21 – 2014-12-22 (×2): 18 ug via RESPIRATORY_TRACT
  Filled 2014-12-20 (×2): qty 5

## 2014-12-20 MED ORDER — VANCOMYCIN HCL IN DEXTROSE 750-5 MG/150ML-% IV SOLN
750.0000 mg | Freq: Two times a day (BID) | INTRAVENOUS | Status: DC
  Administered 2014-12-21 – 2014-12-22 (×3): 750 mg via INTRAVENOUS
  Filled 2014-12-20 (×5): qty 150

## 2014-12-20 MED ORDER — ACETAMINOPHEN 325 MG PO TABS
650.0000 mg | ORAL_TABLET | Freq: Four times a day (QID) | ORAL | Status: DC | PRN
  Administered 2014-12-21: 650 mg via ORAL
  Filled 2014-12-20: qty 2

## 2014-12-20 MED ORDER — PIPERACILLIN-TAZOBACTAM 3.375 G IVPB 30 MIN
3.3750 g | INTRAVENOUS | Status: AC
  Administered 2014-12-20: 3.375 g via INTRAVENOUS
  Filled 2014-12-20: qty 50

## 2014-12-20 MED ORDER — SODIUM CHLORIDE 0.9 % IJ SOLN
3.0000 mL | Freq: Two times a day (BID) | INTRAMUSCULAR | Status: DC
  Administered 2014-12-21 – 2014-12-22 (×4): 3 mL via INTRAVENOUS

## 2014-12-20 MED ORDER — PIPERACILLIN-TAZOBACTAM 3.375 G IVPB
3.3750 g | Freq: Three times a day (TID) | INTRAVENOUS | Status: DC
  Administered 2014-12-21 – 2014-12-22 (×4): 3.375 g via INTRAVENOUS
  Filled 2014-12-20 (×6): qty 50

## 2014-12-20 MED ORDER — SODIUM CHLORIDE 0.9 % IV BOLUS (SEPSIS)
1000.0000 mL | INTRAVENOUS | Status: AC
  Administered 2014-12-20 (×4): 1000 mL via INTRAVENOUS

## 2014-12-20 MED ORDER — METHYLPREDNISOLONE SODIUM SUCC 125 MG IJ SOLR
125.0000 mg | Freq: Once | INTRAMUSCULAR | Status: AC
  Administered 2014-12-20: 125 mg via INTRAVENOUS
  Filled 2014-12-20: qty 2

## 2014-12-20 MED ORDER — PANTOPRAZOLE SODIUM 40 MG PO TBEC
40.0000 mg | DELAYED_RELEASE_TABLET | Freq: Every day | ORAL | Status: DC
  Administered 2014-12-21 – 2014-12-22 (×2): 40 mg via ORAL
  Filled 2014-12-20 (×2): qty 1

## 2014-12-20 MED ORDER — ASPIRIN 81 MG PO CHEW
81.0000 mg | CHEWABLE_TABLET | Freq: Every day | ORAL | Status: DC
Start: 2014-12-21 — End: 2014-12-22
  Administered 2014-12-21 – 2014-12-22 (×2): 81 mg via ORAL
  Filled 2014-12-20 (×2): qty 1

## 2014-12-20 MED ORDER — HEPARIN SODIUM (PORCINE) 5000 UNIT/ML IJ SOLN
5000.0000 [IU] | Freq: Three times a day (TID) | INTRAMUSCULAR | Status: DC
  Administered 2014-12-21 – 2014-12-22 (×6): 5000 [IU] via SUBCUTANEOUS
  Filled 2014-12-20 (×8): qty 1

## 2014-12-20 MED ORDER — ALBUTEROL SULFATE (2.5 MG/3ML) 0.083% IN NEBU
5.0000 mg | INHALATION_SOLUTION | Freq: Once | RESPIRATORY_TRACT | Status: AC
  Administered 2014-12-20: 5 mg via RESPIRATORY_TRACT
  Filled 2014-12-20: qty 6

## 2014-12-20 MED ORDER — ACETAMINOPHEN 650 MG RE SUPP: 650.0000 mg | Freq: Four times a day (QID) | RECTAL | Status: DC | PRN

## 2014-12-20 MED ORDER — ALBUTEROL SULFATE (2.5 MG/3ML) 0.083% IN NEBU: 2.5000 mg | INHALATION_SOLUTION | RESPIRATORY_TRACT | Status: DC | PRN

## 2014-12-20 MED ORDER — PRAVASTATIN SODIUM 40 MG PO TABS
40.0000 mg | ORAL_TABLET | Freq: Every day | ORAL | Status: DC
  Administered 2014-12-21: 40 mg via ORAL
  Filled 2014-12-20 (×2): qty 1

## 2014-12-20 MED ORDER — SODIUM CHLORIDE 0.9 % IV SOLN
2000.0000 mg | INTRAVENOUS | Status: AC
  Administered 2014-12-20: 2000 mg via INTRAVENOUS
  Filled 2014-12-20: qty 2000

## 2014-12-20 MED ORDER — ACETAMINOPHEN 325 MG PO TABS
650.0000 mg | ORAL_TABLET | Freq: Once | ORAL | Status: AC
  Administered 2014-12-20: 650 mg via ORAL
  Filled 2014-12-20: qty 2

## 2014-12-20 MED ORDER — MOMETASONE FURO-FORMOTEROL FUM 100-5 MCG/ACT IN AERO
2.0000 | INHALATION_SPRAY | Freq: Two times a day (BID) | RESPIRATORY_TRACT | Status: DC
  Administered 2014-12-21 – 2014-12-22 (×3): 2 via RESPIRATORY_TRACT
  Filled 2014-12-20: qty 8.8

## 2014-12-20 NOTE — ED Notes (Signed)
Notified MD Pollina of lactic acid result

## 2014-12-20 NOTE — ED Notes (Signed)
Called to give report to 3S. Nurse unavailable. Requested to talk to charge nurse. On hold.

## 2014-12-20 NOTE — ED Notes (Signed)
Pt used bedside commode for urine sample but had a diarrheal episode. Sample unusable. Will try again if able.

## 2014-12-20 NOTE — ED Notes (Signed)
Bed: WA08 Expected date:  Expected time:  Means of arrival:  Comments: Ems- sob

## 2014-12-20 NOTE — ED Provider Notes (Signed)
CSN: [id number]     Arrival date & time 12/20/14  1627 History   First MD Initiated Contact with Patient 12/20/14 1639     Chief Complaint  Patient presents with  . Shortness of Breath   (Consider location/radiation/quality/duration/timing/severity/associated sxs/prior Treatment) The history is provided by the patient and medical records.    59 y.o. F with hx of CAD with stent placement, pulmonary HTN, CHF, COPD on home O2, diabetes, HLP, GERD, presenting to the ED for SOB.  Patient states she began feeling "bad" yesterday.  She states she has been having some burning pain in both of her legs and then her breathing started "acting up".  Patient is on home O2, 4L at baseline.  She states she has used her albuterol inhaler but not the nebulizer.  She also has not taken her lasix in the past 2 days as it has been difficult for her to get up because of pain in her legs and some fatigue.  Patient was also recently diagnosed with left lower lobe lung cancer. She has been seeing oncology, Dr. Earlie Server. She is scheduled to undergo radiation only as she is not a surgical candidate given her multiple comorbidities.  Denies fever but states she has been chilled for 2 days now despite not having air conditioning in her house.  Chronic cough noted, cannot endorse if worse than baseline.  Patient febrile and tachycardic on arrival, BP stable.  Past Medical History  Diagnosis Date  . Coronary artery disease     S/P PCI of LAD with DES (12/2008). Total occlusion of RCA noted at that time., medically managed. ACS ruled out 03/2009 with Lexiscan myoview . Followed by [facility].  . Pulmonary hypertension     2-D Echo (05/4095) - Systolic pressure was moderately increased. PA peak pressure  52mHg. 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 Followed by Dr. WJoya Gaskins(Velora Heckler  . Diastolic dysfunction     2-D Echo  (12/2008) - Normal LV Systolic funciton with EF 60-65%. Grade 1 diastolid dysfunction. No regional wall motion abnormalities. Moderate pulmonary HTN with PA peak pressure 543mg.  . Marland KitchenOPD (chronic obstructive pulmonary disease)     Severe. Gold Stage IV.  PFTs (12/2008) - severe obstructive airway disease. Active tobacco use. Requires 4L O2 at home.  . Pulmonary nodule, right     Small right middle lobe nodule. Stable as of 12/2008.  . Marland Kitchenrediabetes 12/2008    HgbA1c 6.4 (12/2008)  . Hx MRSA infection     Recurrent MRSA thigh abscesses.  . Tobacco abuse     Ongoing.  . Obesity   . Hyperlipidemia   . GERD (gastroesophageal reflux disease)     S/P Nissen fundoplication.  . CHF (congestive heart failure)   . H/O right coronary artery stent placement 2010  . On home oxygen therapy since 2010    4L all the time  . Shortness of breath dyspnea     with a lot of exertion; if fluid builds up  . Pneumonia   . Depression   . Arthritis   . Cancer     lung cancer  . Full dentures    Past Surgical History  Procedure Laterality Date  . Total abdominal hysterectomy w/ bilateral salpingoophorectomy    . Nissen fundoplication    . Right heart catheterization N/A 11/09/2012    Procedure: RIGHT HEART CATH;  Surgeon: DaLarey DresserMD;  Location: [facility] CATH LAB;  Service: Cardiovascular;  Laterality: N/A;  . Appendectomy    . Tonsillectomy    . Back surgery  [facility], Dr. Lorin Mercy  . Breast surgery  25 years ago    Lumpectomy (biopsy negative)  . Cardiac catheterization  2010    [facility]  . Abdominal hysterectomy    . Knee cartilage surgery Right 10 years ago approx  . Tubal ligation  1979  . Video bronchoscopy with endobronchial ultrasound N/A 12/15/2014    Procedure: VIDEO BRONCHOSCOPY WITH ENDOBRONCHIAL ULTRASOUND;  Surgeon: Ivin Poot, MD;  Location: [facility] OR;  Service: Thoracic;  Laterality: N/A;   Family History  Problem Relation Age of Onset  . Heart disease Mother 76    Deceased  from MI at 79yo  . Hypertension Mother   . Heart disease Father 89    Deceased of MI age 2yo  . Hypertension Father   . Hypertension Brother   . Lung cancer      Grandmother   Social History  Substance Use Topics  . Smoking status: Current Every Day Smoker -- 0.50 packs/day for 45 years    Types: Cigarettes    Start date: 04/04/1969  . Smokeless tobacco: Never Used     Comment: 1/2 pack or less. 1 pack to .5 pack per day  . Alcohol Use: No   OB History    No data available     Review of Systems  Respiratory: Positive for cough, shortness of breath and wheezing.   All other systems reviewed and are negative.     Allergies  Fluconazole and Atorvastatin  Home Medications   Prior to Admission medications   Medication Sig Start Date End Date Taking? Authorizing Provider  albuterol (PROVENTIL) (2.5 MG/3ML) 0.083% nebulizer solution Take 3 mLs (2.5 mg total) by nebulization every 6 (six) hours as needed for shortness of breath. 09/05/14   Oval Linsey, MD  albuterol (VENTOLIN HFA) 108 (90 BASE) MCG/ACT inhaler Inhale 2 puffs into the lungs every 6 (six) hours as needed for shortness of breath. 12/05/14   Oval Linsey, MD  aspirin 81 MG chewable tablet Chew 1 tablet (81 mg total) by mouth daily. Patient taking differently: Chew 81 mg by mouth at bedtime.  05/27/14   Norman Herrlich, MD  Fluticasone-Salmeterol (ADVAIR DISKUS) 250-50 MCG/DOSE AEPB Inhale 1 puff into the lungs 2 (two) times daily. 09/05/14   Dellia Nims, MD  furosemide (LASIX) 40 MG tablet Take 1 tablet (40 mg total) by mouth 2 (two) times daily. 09/27/14   Erick Colace, NP  lovastatin (MEVACOR) 40 MG tablet Take 1 tablet (40 mg total) by mouth at bedtime. 09/11/14 09/11/15  Corky Sox, MD  pantoprazole (PROTONIX) 40 MG tablet Take 1 tablet (40 mg total) by mouth daily. 12/05/14   Oval Linsey, MD  Potassium Chloride ER 20 MEQ TBCR Take 40 mEq by mouth daily. Patient taking differently: Take 20 mEq by mouth 2 (two)  times daily.  10/30/14   Linton Flemings, MD  tiotropium (SPIRIVA HANDIHALER) 18 MCG inhalation capsule Place 1 capsule (18 mcg total) into inhaler and inhale daily. 09/05/14   Tasrif Ahmed, MD   BP 122/66 mmHg  Pulse 101  Temp(Src) 100.1 F (37.8 C) (Oral)  Resp 18  SpO2 94%   Physical Exam  Constitutional: She is oriented to person, place, and time. She appears well-developed and well-nourished.  Shivering in room, states she is chilled  HENT:  Head:  Normocephalic and atraumatic.  Mouth/Throat: Oropharynx is clear and moist.  Eyes: Conjunctivae and EOM are normal. Pupils are equal, round, and reactive to light.  Neck: Normal range of motion.  Cardiovascular: Normal rate, regular rhythm and normal heart sounds.   Pulmonary/Chest: Accessory muscle usage present. Tachypnea noted. She is in respiratory distress. She has wheezes.  Mild respiratory distress; increased work of breathing with tachypnea; coarse breath sounds bilaterally with wheezes noted  Abdominal: Soft. Bowel sounds are normal. There is no tenderness.  Musculoskeletal: Normal range of motion.  2+ pitting edema of BLE  Neurological: She is alert and oriented to person, place, and time.  Skin: Skin is warm and dry.  Psychiatric: She has a normal mood and affect.  Nursing note and vitals reviewed.   ED Course  Procedures (including critical care time)  CRITICAL CARE Performed by: Larene Pickett   Total critical care time: 45  Critical care time was exclusive of separately billable procedures and treating other patients.  Critical care was necessary to treat or prevent imminent or life-threatening deterioration.  Critical care was time spent personally by me on the following activities: development of treatment plan with patient and/or surrogate as well as nursing, discussions with consultants, evaluation of patient's response to treatment, examination of patient, obtaining history from patient or surrogate, ordering and  performing treatments and interventions, ordering and review of laboratory studies, ordering and review of radiographic studies, pulse oximetry and re-evaluation of patient's condition.  Medications  sodium chloride 0.9 % bolus 1,000 mL (1,000 mLs Intravenous New Bag/Given 12/20/14 1853)  vancomycin (VANCOCIN) 2,000 mg in sodium chloride 0.9 % 500 mL IVPB (2,000 mg Intravenous New Bag/Given 12/20/14 1904)  vancomycin (VANCOCIN) IVPB 750 mg/150 ml premix (not administered)  piperacillin-tazobactam (ZOSYN) IVPB 3.375 g (not administered)  methylPREDNISolone sodium succinate (SOLU-MEDROL) 125 mg/2 mL injection 125 mg (125 mg Intravenous Given 12/20/14 1734)  albuterol (PROVENTIL) (2.5 MG/3ML) 0.083% nebulizer solution 5 mg (5 mg Nebulization Given 12/20/14 1734)  piperacillin-tazobactam (ZOSYN) IVPB 3.375 g (0 g Intravenous Stopped 12/20/14 1859)  acetaminophen (TYLENOL) tablet 650 mg (650 mg Oral Given 12/20/14 1856)    Sepsis - Repeat Assessment  Performed at:    [facility]     Blood pressure 107/62, pulse 98, temperature 102 F (38.9 C), temperature source Oral, resp. rate 20, height 5' 5" (1.651 m), weight 255 lb (115.667 kg), SpO2 97 %.  Heart:     Regular rate and rhythm  Lungs:    Wheezing  Capillary Refill:   <2 sec  Peripheral Pulse:   Radial pulse palpable  Skin:     Normal Color    Labs Review Labs Reviewed  CBC WITH DIFFERENTIAL/PLATELET - Abnormal; Notable for the following:    WBC 15.1 (*)    RBC 3.83 (*)    MCV 103.9 (*)    RDW 17.0 (*)    Neutro Abs 12.3 (*)    Monocytes Absolute 1.2 (*)    All other components within normal limits  BASIC METABOLIC PANEL - Abnormal; Notable for the following:    Chloride 97 (*)    CO2 33 (*)    Glucose, Bld 165 (*)    BUN <5 (*)    Calcium 8.8 (*)    All other components within normal limits  I-STAT CG4 LACTIC ACID, ED - Abnormal; Notable for the following:    Lactic Acid, Venous 3.60 (*)    All other components within  normal limits  CULTURE, BLOOD (  ROUTINE X 2)  CULTURE, BLOOD (ROUTINE X 2)  URINE CULTURE  BRAIN NATRIURETIC PEPTIDE  URINALYSIS, ROUTINE W REFLEX MICROSCOPIC (NOT AT [facility])  Randolm Idol, ED    Imaging Review Dg Chest Port 1 View  12/20/2014   CLINICAL DATA:  Shortness of breath, fever, cough  EXAM: PORTABLE CHEST - 1 VIEW  COMPARISON:  12/15/2014  FINDINGS: Cardiomediastinal silhouette is stable. Nodular mass in left lower lobe again noted. No acute infiltrate or pulmonary edema. Bony thorax is stable.  IMPRESSION: No active disease.  Again noted nodular mass in left lower lobe.   Electronically Signed   By: Lahoma Crocker M.D.   On: 12/20/2014 17:13   I have personally reviewed and evaluated these images and lab results as part of my medical decision-making.   EKG Interpretation None      MDM   Final diagnoses:  Sepsis, due to unspecified organism  Shortness of breath  Fever, unspecified fever cause  Chronic obstructive pulmonary disease, unspecified COPD, unspecified chronic bronchitis type   59 year old female here with shortness of breath which is likely multifactorial given her CHF, COPD, and newly diagnosed lung cancer. She is febrile and tachycardic on arrival. Code sepsis was activated.  She is on her baseline 4L home O2 mild distress noted so was given albuterol and solu-medrol.  She has known CHF, however EF 50-55%.  IVF started at 30 cc/kg rate.  She has a lactic acidosis at 3.6 with leukocytosis at 15,000.  CO2 elevated which appears chronic for patient given her chronic O2 requirement.  CXR without acute infiltrate or pulmonary edema.  Blood and urine cultures pending.  HR improving with fluids, patient tolerating well.  Case discussed with IM teaching service, will admit for further management. Patient appears stable for transfer to [facility].  EMTALA completed, Dr. Daryll Drown accepting.  Larene Pickett, PA-C 12/20/14 2123  Orpah Greek, MD 12/21/14 2225

## 2014-12-20 NOTE — ED Notes (Signed)
[facility] presents with a 59 yo female from home with SOB for 6 hours and chest wall pain.  Upon [facility] arrival pt O2 SATs were at 89% with dyspnea and diminished breath sounds and wheezing.  Pt took home Duoneb without relief and [facility] gave Duoneb and increased SATs to 96% while on neb.  12 lead unremarkable.  Dx with lung cancer last month.

## 2014-12-20 NOTE — Progress Notes (Signed)
ANTIBIOTIC CONSULT NOTE - INITIAL  Pharmacy Consult for vancomycin, Zosyn Indication: pneumonia/rule out sepsis  Allergies  Allergen Reactions  . Fluconazole Anaphylaxis, Itching and Swelling  . Atorvastatin Other (See Comments)    Dizziness after med started and resolved per pt after med stopped. September 08 2014    Patient Measurements: Height: _0  (165.1 cm) Weight: 255 lb (115.667 kg) IBW/kg (Calculated) : 57  Vital Signs: Temp: 100.1 F (37.8 C) (09/17 1640) Temp Source: Oral (09/17 1735) BP: 107/62 mmHg (09/17 1735) Pulse Rate: 98 (09/17 1735) Intake/Output from previous day:   Intake/Output from this shift:    Labs: No results for input(s): WBC, HGB, PLT, LABCREA, CREATININE in the last 72 hours. Estimated Creatinine Clearance: 95 mL/min (by C-G formula based on Cr of 0.71). No results for input(s): VANCOTROUGH, VANCOPEAK, VANCORANDOM, GENTTROUGH, GENTPEAK, GENTRANDOM, TOBRATROUGH, TOBRAPEAK, TOBRARND, AMIKACINPEAK, AMIKACINTROU, AMIKACIN in the last 72 hours.   Microbiology: No results found for this or any previous visit (from the past 720 hour(s)).  Medical History: Past Medical History  Diagnosis Date  . Coronary artery disease     S/P PCI of LAD with DES (12/2008). Total occlusion of RCA noted at that time., medically managed. ACS ruled out 03/2009 with Lexiscan myoview . Followed by [facility].  . Pulmonary hypertension     2-D Echo (68/1275) - Systolic pressure was moderately increased. PA peak pressure  32mHg. 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 Followed by Dr. WJoya Gaskins(Velora Heckler  . Diastolic dysfunction     2-D Echo (12/2008) - Normal LV Systolic funciton with EF 60-65%. Grade 1 diastolid dysfunction. No regional wall motion abnormalities. Moderate pulmonary HTN with PA peak pressure 53mg.  . Marland KitchenOPD (chronic obstructive pulmonary disease)     Severe.  Gold Stage IV.  PFTs (12/2008) - severe obstructive airway disease. Active tobacco use. Requires 4L O2 at home.  . Pulmonary nodule, right     Small right middle lobe nodule. Stable as of 12/2008.  . Marland Kitchenrediabetes 12/2008    HgbA1c 6.4 (12/2008)  . Hx MRSA infection     Recurrent MRSA thigh abscesses.  . Tobacco abuse     Ongoing.  . Obesity   . Hyperlipidemia   . GERD (gastroesophageal reflux disease)     S/P Nissen fundoplication.  . CHF (congestive heart failure)   . H/O right coronary artery stent placement 2010  . On home oxygen therapy since 2010    4L all the time  . Shortness of breath dyspnea     with a lot of exertion; if fluid builds up  . Pneumonia   . Depression   . Arthritis   . Cancer     lung cancer  . Full dentures     Medications:  Scheduled:   Infusions:  . sodium chloride     Assessment: [age]o with NSCLC currently undergoing curative radiotherapy presents to ER with CC SOB. To start vancomycin and Zosyn per pharmacy dosing for rule out sepsis/PNA. Elevated lactate and Tmax 100.1. WBC 15.1 and SCr 0.75 with CrCl N 90  Goal of Therapy:  Vancomycin trough level 15-20 mcg/ml  Plan:  1) Vancomycin 2g IV x 1 then [id number] based on current weight and renal function 2) Zosyn 3.375g IV q8 (extended interval infusion) for CrCl > 20 ml/min   JusAdrian SaranharmD, BCPS Pager [phone]/2016 5:48 PM

## 2014-12-20 NOTE — ED Notes (Signed)
Called to give report to nurse. Nurse will call back.

## 2014-12-20 NOTE — H&P (Signed)
Date: 12/20/2014               Patient Name:  [name] MRN: [id number]  DOB: 01/29/1956 Age / Sex: 59 y.o., female   PCP: Oval Linsey, MD         Medical Service: Internal Medicine Teaching Service         Attending Physician: Dr. Sid Falcon, MD    First Contact: Dr. Jule Ser Pager: [phone]  Second Contact: Dr. Dellia Nims Pager: [phone]       After Hours (After 5p/  First Contact Pager: [phone]  weekends / holidays): Second Contact Pager: [phone]   Chief Complaint: "I feel cold and tired."  History of Present Illness: [name] is a 59 year old lady with history of COPD on 4L O2 at home, newly diagnosed stage II adenocarcinoma of the lung status post bronchoscopy with biopsy 5 days ago, pulmonary hypertension, coronary artery disease status post DES placement 2010, heart failure with preserved ejection fraction, presenting with subjective fever and malaise. When she awoke this morning, she started feeling cold and tired. She usually sits at home with a fan on her because she's naturally so hot, but she had to wrap herself up in blankets. She also complained of a mild bifrontal headache that improved since she's been in the hospital, intermittent diarrhea over the last few months, and bilateral stabbing leg pain over the last month. She denied other symptoms such as cough, nausea, headache, dysuria, or localizing signs of infection. Her grandson said she looked bad so he called an ambulance to come her get her. In the emergency department, she was febrile to 102, tachycardic to 98, with leukocytosis of 15k and a lactic acid of 3.6, so sepsis protocol was initiated. Chest x-ray showed a left-sided lung mass, unchanged from prior exam, without evidence of acute process. Urinalysis and blood cultures were obtained.  Meds: Current Facility-Administered Medications  Medication Dose Route Frequency Provider Last Rate Last Dose  . acetaminophen (TYLENOL) tablet 650 mg  650 mg  Oral Q6H PRN Lucious Groves, DO       Or  . acetaminophen (TYLENOL) suppository 650 mg  650 mg Rectal Q6H PRN Lucious Groves, DO      . albuterol (PROVENTIL) (2.5 MG/3ML) 0.083% nebulizer solution 2.5 mg  2.5 mg Nebulization Q2H PRN Lucious Groves, DO      . [START ON 12/21/2014] aspirin chewable tablet 81 mg  81 mg Oral Daily Lucious Groves, DO      . heparin injection 5,000 Units  5,000 Units Subcutaneous 3 times per day Lucious Groves, DO      . mometasone-formoterol (DULERA) 100-5 MCG/ACT inhaler 2 puff  2 puff Inhalation BID Lucious Groves, DO      . [START ON 12/21/2014] pantoprazole (PROTONIX) EC tablet 40 mg  40 mg Oral Daily Lucious Groves, DO      . [START ON 12/21/2014] piperacillin-tazobactam (ZOSYN) IVPB 3.375 g  3.375 g Intravenous Q8H Adrian Saran, [facility]      . [START ON 12/21/2014] pravastatin (PRAVACHOL) tablet 40 mg  40 mg Oral q1800 Lucious Groves, DO      . sodium chloride 0.9 % injection 3 mL  3 mL Intravenous Q12H Lucious Groves, DO      . [START ON 12/21/2014] tiotropium (SPIRIVA) inhalation capsule 18 mcg  18 mcg Inhalation Daily Lucious Groves, DO      . [START ON 12/21/2014]  vancomycin (VANCOCIN) IVPB 750 mg/150 ml premix  750 mg Intravenous Q12H Adrian Saran, [facility]        Allergies: Allergies as of 12/20/2014 - Review Complete 12/20/2014  Allergen Reaction Noted  . Fluconazole Anaphylaxis, Itching, and Swelling   . Atorvastatin Other (See Comments) 09/09/2014   Past Medical History  Diagnosis Date  . Coronary artery disease     S/P PCI of LAD with DES (12/2008). Total occlusion of RCA noted at that time., medically managed. ACS ruled out 03/2009 with Lexiscan myoview . Followed by [place].  . Pulmonary hypertension     2-D Echo (43/8381) - Systolic pressure was moderately increased. PA peak pressure  59mHg. 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 Followed by Dr.  WJoya Gaskins(Velora Heckler  . Diastolic dysfunction     2-D Echo (12/2008) - Normal LV Systolic funciton with EF 60-65%. Grade 1 diastolid dysfunction. No regional wall motion abnormalities. Moderate pulmonary HTN with PA peak pressure 589mg.  . Marland KitchenOPD (chronic obstructive pulmonary disease)     Severe. Gold Stage IV.  PFTs (12/2008) - severe obstructive airway disease. Active tobacco use. Requires 4L O2 at home.  . Pulmonary nodule, right     Small right middle lobe nodule. Stable as of 12/2008.  . Marland Kitchenrediabetes 12/2008    HgbA1c 6.4 (12/2008)  . Hx MRSA infection     Recurrent MRSA thigh abscesses.  . Tobacco abuse     Ongoing.  . Obesity   . Hyperlipidemia   . GERD (gastroesophageal reflux disease)     S/P Nissen fundoplication.  . CHF (congestive heart failure)   . H/O right coronary artery stent placement 2010  . On home oxygen therapy since 2010    4L all the time  . Shortness of breath dyspnea     with a lot of exertion; if fluid builds up  . Pneumonia   . Depression   . Arthritis   . Cancer     lung cancer  . Full dentures    Past Surgical History  Procedure Laterality Date  . Total abdominal hysterectomy w/ bilateral salpingoophorectomy    . Nissen fundoplication    . Right heart catheterization N/A 11/09/2012    Procedure: RIGHT HEART CATH;  Surgeon: DaLarey DresserMD;  [facility];  Service: Cardiovascular;  Laterality: N/A;  . Appendectomy    . Tonsillectomy    . [facility] Breast surgery  25 years ago    Lumpectomy (biopsy negative)  . [facility]    . Knee cartilage surgery Right 10 years ago approx  . Tubal ligation  1979  . Video bronchoscopy with endobronchial ultrasound N/A 12/15/2014    Procedure: VIDEO BRONCHOSCOPY WITH ENDOBRONCHIAL ULTRASOUND;  Surgeon: PeIvin PootMD;  [facility];  Service: Thoracic;  Laterality: N/A;   Family History  Problem  Relation Age of Onset  . Heart disease Mother 4793  Deceased from MI at 60yo. Hypertension Mother   . Heart disease Father 5438  Deceased of MI age 60yo. Hypertension Father   . Hypertension Brother   . Lung cancer      Grandmother   Social History   Social History  . Marital Status: Single  Spouse Name: N/A  . Number of Children: N/A  . Years of Education: N/A   Occupational History  . Not on file.   Social History Main Topics  . Smoking status: Current Every Day Smoker -- 0.50 packs/day for 45 years    Types: Cigarettes    Start date: 04/04/1969  . Smokeless tobacco: Never Used     Comment: 1/2 pack or less. 1 pack to .5 pack per day  . Alcohol Use: No  . Drug Use: No  . Sexual Activity: No   Other Topics Concern  . Not on file   Social History Narrative   Formerly worked as a Scientist, water quality, now disabled.   Divorced.   2 grown children.   Lives with her grandson.    Review of Systems  Constitutional: Positive for fever, chills and malaise/fatigue.  Respiratory: Positive for cough and wheezing. Negative for hemoptysis, sputum production and shortness of breath.   Cardiovascular: Negative for chest pain, orthopnea, leg swelling and PND.  Gastrointestinal: Positive for diarrhea. Negative for heartburn, nausea, vomiting, abdominal pain, constipation, blood in stool and melena.  Genitourinary: Negative for dysuria, urgency, frequency and flank pain.  Musculoskeletal: Positive for myalgias. Negative for back pain, falls and neck pain.  Skin: Negative for rash.  Neurological: Negative for dizziness, tingling, tremors, seizures, loss of consciousness and headaches.   Physical Exam: Blood pressure 134/68, pulse 96, temperature 98.9 F (37.2 C), temperature source Oral, resp. rate 23, height _0  (1.651 m), weight 119.205 kg (262 lb 12.8 oz), SpO2 95 %.  General: obese woman sleeping in bed HEENT: no scleral icterus Cardiac: RRR, no rubs, murmurs or gallops Pulm:  crackles in left lower lobe, otherwise clear to auscultation, satting 92% on 3L Abd: soft, nontender, obese, BS present Ext: warm and well perfused, 1+ pitting edema lower extremities Neuro: alert and oriented X3, cranial nerves II-XII grossly intact  Lab results: Basic Metabolic Panel:  Recent Labs  12/20/14 1715  NA 137  K 4.1  CL 97*  CO2 33*  GLUCOSE 165*  BUN <5*  CREATININE 0.75  CALCIUM 8.8*   CBC:  Recent Labs  12/20/14 1715  WBC 15.1*  NEUTROABS 12.3*  HGB 12.6  HCT 39.8  MCV 103.9*  PLT 247   Urine Drug Screen: Drugs of Abuse     Component Value Date/Time   LABOPIA NONE DETECTED 07/01/2013 1218   [place] 02/04/2009 2315   [place] DETECTED 07/01/2013 [place] 02/04/2009 2315   LABBENZ NONE DETECTED 07/01/2013 [place] 02/04/2009 2315   AMPHETMU NONE DETECTED 07/01/2013 1218   AMPHETMU NEGATIVE 02/04/2009 2315   THCU NONE DETECTED 07/01/2013 1218   LABBARB NONE DETECTED 07/01/2013 1218    Imaging results:  Dg Chest Port 1 View  12/20/2014   CLINICAL DATA:  Shortness of breath, fever, cough  EXAM: PORTABLE CHEST - 1 VIEW  COMPARISON:  12/15/2014  FINDINGS: Cardiomediastinal silhouette is stable. Nodular mass in left lower lobe again noted. No acute infiltrate or pulmonary edema. Bony thorax is stable.  IMPRESSION: No active disease.  Again noted nodular mass in left lower lobe.   Electronically Signed   By: Lahoma Crocker M.D.   On: 12/20/2014 17:13   Assessment & Plan by Problem: Ms. Hoggard is a 59 year old lady presenting with signs suggestive of sepsis of unknown origin. One consideration is an occult infection in her lung mass because she underwent bronchoscopy with biopsy 5 days ago.  Although the chest x-ray did not show any changes from her previous imaging, there may be an infection hiding within the mass. I also considered hyperthyroidism given her long history of heat intolerance and intermittent  diarrhea. Alternatively, this may also be a viral process given the acute onset and her generalized malaise. We will follow blood cultures, urinalysis, TSH, and continue vancomycin and Zosyn empirically for sepsis. Regarding her lower leg pain, I suspect this is diabetic neuropathy given the distribution and description of stabbing pain, but this may be B12 deficiency given her macrocytosis. She has good pulses and no signs of arterial insufficiency.  Sepsis of unknown origin: Per above, my differential includes infection within lung mass, hyperthyroidism, or viral process. -Continue vancomycin/Zosyn for now -Will bolus with fluids as needed -Follow blood cultures -Follow urinalysis -Follow TSH  Bilateral lower leg pain: Per above, I suspect this is likely diabetic neuropathy versus B12 deficiency. -Follow A1c -Follow B12 level -Can consider trial of gabapentin  COPD: Not an acute issue. -Continue O2 at 3L -Continue home duonebs  Dispo: Disposition is deferred at this time, awaiting improvement of current medical problems. Anticipated discharge in approximately 1-2 day(s).   The patient does have a current PCP Oval Linsey, MD) and does need an [facility] hospital follow-up appointment after discharge.  The patient does not have transportation limitations that hinder transportation to clinic appointments.  Signed: Loleta Chance, MD 12/20/2014, 11:01 PM

## 2014-12-20 NOTE — ED Notes (Signed)
Pt is violently shivering and hot to the touch.  Oral temp of 102.0

## 2014-12-21 DIAGNOSIS — Z9981 Dependence on supplemental oxygen: Secondary | ICD-10-CM

## 2014-12-21 DIAGNOSIS — E538 Deficiency of other specified B group vitamins: Secondary | ICD-10-CM | POA: Diagnosis present

## 2014-12-21 DIAGNOSIS — M79604 Pain in right leg: Secondary | ICD-10-CM

## 2014-12-21 DIAGNOSIS — R51 Headache: Secondary | ICD-10-CM

## 2014-12-21 DIAGNOSIS — G63 Polyneuropathy in diseases classified elsewhere: Secondary | ICD-10-CM

## 2014-12-21 DIAGNOSIS — J449 Chronic obstructive pulmonary disease, unspecified: Secondary | ICD-10-CM

## 2014-12-21 DIAGNOSIS — A419 Sepsis, unspecified organism: Secondary | ICD-10-CM

## 2014-12-21 DIAGNOSIS — C3490 Malignant neoplasm of unspecified part of unspecified bronchus or lung: Secondary | ICD-10-CM

## 2014-12-21 DIAGNOSIS — M79605 Pain in left leg: Secondary | ICD-10-CM

## 2014-12-21 LAB — CBC
HEMATOCRIT: 37 % (ref 36.0–46.0)
Hemoglobin: 11.4 g/dL — ABNORMAL LOW (ref 12.0–15.0)
MCH: 32.2 pg (ref 26.0–34.0)
MCHC: 30.8 g/dL (ref 30.0–36.0)
MCV: 104.5 fL — AB (ref 78.0–100.0)
PLATELETS: 239 10*3/uL (ref 150–400)
RBC: 3.54 MIL/uL — AB (ref 3.87–5.11)
RDW: 17.1 % — ABNORMAL HIGH (ref 11.5–15.5)
WBC: 9.6 10*3/uL (ref 4.0–10.5)

## 2014-12-21 LAB — BASIC METABOLIC PANEL
Anion gap: 4 — ABNORMAL LOW (ref 5–15)
CHLORIDE: 102 mmol/L (ref 101–111)
CO2: 32 mmol/L (ref 22–32)
CREATININE: 0.75 mg/dL (ref 0.44–1.00)
Calcium: 8.3 mg/dL — ABNORMAL LOW (ref 8.9–10.3)
GFR calc non Af Amer: >60 mL/min (ref >60)
Glucose, Bld: 199 mg/dL — ABNORMAL HIGH (ref 65–99)
POTASSIUM: 4.9 mmol/L (ref 3.5–5.1)
SODIUM: 138 mmol/L (ref 135–145)

## 2014-12-21 LAB — URINALYSIS, ROUTINE W REFLEX MICROSCOPIC
Bilirubin Urine: NEGATIVE
Glucose, UA: NEGATIVE mg/dL
Hgb urine dipstick: NEGATIVE
Ketones, ur: NEGATIVE mg/dL
LEUKOCYTES UA: NEGATIVE
NITRITE: NEGATIVE
PROTEIN: NEGATIVE mg/dL
Specific Gravity, Urine: 1.005 (ref 1.005–1.030)
Urobilinogen, UA: 0.2 mg/dL (ref 0.0–1.0)
pH: 5.5 (ref 5.0–8.0)

## 2014-12-21 LAB — VITAMIN B12: VITAMIN B 12: 161 pg/mL — AB (ref 180–914)

## 2014-12-21 LAB — TSH: TSH: 1.244 u[IU]/mL (ref 0.350–4.500)

## 2014-12-21 LAB — FOLATE: FOLATE: 8.6 ng/mL (ref >5.9)

## 2014-12-21 LAB — MRSA PCR SCREENING: MRSA by PCR: NEGATIVE

## 2014-12-21 LAB — LACTIC ACID, PLASMA
Lactic Acid, Venous: 1.5 mmol/L (ref 0.5–2.0)
Lactic Acid, Venous: 2.8 mmol/L (ref 0.5–2.0)

## 2014-12-21 MED ORDER — KETOROLAC TROMETHAMINE 30 MG/ML IJ SOLN
30.0000 mg | Freq: Four times a day (QID) | INTRAMUSCULAR | Status: DC | PRN
  Administered 2014-12-21 (×2): 30 mg via INTRAVENOUS
  Filled 2014-12-21 (×2): qty 1

## 2014-12-21 MED ORDER — SODIUM CHLORIDE 0.9 % IV BOLUS (SEPSIS)
500.0000 mL | Freq: Once | INTRAVENOUS | Status: AC
  Administered 2014-12-21: 500 mL via INTRAVENOUS

## 2014-12-21 MED ORDER — CYANOCOBALAMIN 1000 MCG/ML IJ SOLN
1000.0000 ug | Freq: Once | INTRAMUSCULAR | Status: AC
  Administered 2014-12-21: 1000 ug via INTRAMUSCULAR
  Filled 2014-12-21: qty 1

## 2014-12-21 NOTE — Progress Notes (Signed)
Utilization Review Completed.Donne Anon T9/18/2016

## 2014-12-21 NOTE — Progress Notes (Signed)
Subjective:  Patient is doing better now. Afebrile overnight. Vit B12 was low. Complaining of leg pain.   Objective: Vital signs in last 24 hours: Filed Vitals:   12/21/14 0400 12/21/14 0720 12/21/14 0722 12/21/14 1119  BP:  124/55  117/67  Pulse:  86  78  Temp: 98.8 F (37.1 C)  98.6 F (37 C) 97.7 F (36.5 C)  TempSrc: Oral  Oral Oral  Resp:  18  21  Height:      Weight:      SpO2:  97%  100%   Weight change:   Intake/Output Summary (Last 24 hours) at 12/21/14 1357 Last data filed at 12/21/14 1109  Gross per 24 hour  Intake    773 ml  Output   1700 ml  Net   -927 ml   Vitals reviewed. General: obese female, resting in bed, NAD HEENT: PERRL, EOMI, no scleral icterus Cardiac: RRR, no rubs, murmurs or gallops Pulm: clear to auscultation bilaterally, no wheezes, rales, or rhonchi Abd: soft, nontender, nondistended, BS present Ext: warm and well perfused, 1+ pedela edema b/l. Neuro: alert and oriented X3  Lab Results: Basic Metabolic Panel:  Recent Labs Lab 12/20/14 1715 12/21/14 0610  NA 137 138  K 4.1 4.9  CL 97* 102  CO2 33* 32  GLUCOSE 165* 199*  BUN <5* <5*  CREATININE 0.75 0.75  CALCIUM 8.8* 8.3*   Liver Function Tests: No results for input(s): AST, ALT, ALKPHOS, BILITOT, PROT, ALBUMIN in the last 168 hours. No results for input(s): LIPASE, AMYLASE in the last 168 hours. No results for input(s): AMMONIA in the last 168 hours. CBC:  Recent Labs Lab 12/20/14 1715 12/21/14 0610  WBC 15.1* 9.6  NEUTROABS 12.3*  --   HGB 12.6 11.4*  HCT 39.8 37.0  MCV 103.9* 104.5*  PLT 247 239   Cardiac Enzymes: No results for input(s): CKTOTAL, CKMB, CKMBINDEX, TROPONINI in the last 168 hours. BNP: No results for input(s): PROBNP in the last 168 hours. D-Dimer: No results for input(s): DDIMER in the last 168 hours. CBG: No results for input(s): GLUCAP in the last 168 hours. Hemoglobin A1C: No results for input(s): HGBA1C in the last 168  hours. Fasting Lipid Panel: No results for input(s): CHOL, HDL, LDLCALC, TRIG, CHOLHDL, LDLDIRECT in the last 168 hours. Thyroid Function Tests:  Recent Labs Lab 12/20/14 2333  TSH 1.244   Coagulation: No results for input(s): LABPROT, INR in the last 168 hours. Anemia Panel:  Recent Labs Lab 12/20/14 2333  VITAMINB12 161*  FOLATE 8.6   Urine Drug Screen: Drugs of Abuse     Component Value Date/Time   LABOPIA NONE DETECTED 07/01/2013 1218   LABOPIA NEGATIVE 02/04/2009 2315   COCAINSCRNUR NONE DETECTED 07/01/2013 1218   COCAINSCRNUR NEGATIVE 02/04/2009 2315   LABBENZ NONE DETECTED 07/01/2013 1218   LABBENZ NEGATIVE 02/04/2009 2315   AMPHETMU NONE DETECTED 07/01/2013 1218   AMPHETMU NEGATIVE 02/04/2009 2315   THCU NONE DETECTED 07/01/2013 1218   LABBARB NONE DETECTED 07/01/2013 1218    Alcohol Level: No results for input(s): ETH in the last 168 hours. Urinalysis:  Recent Labs Lab 12/20/14 2347  COLORURINE YELLOW  LABSPEC 1.005  PHURINE 5.5  GLUCOSEU NEGATIVE  HGBUR NEGATIVE  BILIRUBINUR NEGATIVE  KETONESUR NEGATIVE  PROTEINUR NEGATIVE  UROBILINOGEN 0.2  NITRITE NEGATIVE  LEUKOCYTESUR NEGATIVE   Misc. Labs:  Micro Results: Recent Results (from the past 240 hour(s))  Blood Culture (routine x 2)     Status: None (Preliminary  result)   Collection Time: 12/20/14  5:14 PM  Result Value Ref Range Status   Specimen Description BLOOD LEFT ANTECUBITAL  Final   Special Requests BOTTLES DRAWN AEROBIC AND ANAEROBIC 5 CC EACH  Final   Culture   Final    NO GROWTH < 24 HOURS Performed at [facility]    Report Status PENDING  Incomplete  Blood Culture (routine x 2)     Status: None (Preliminary result)   Collection Time: 12/20/14  5:15 PM  Result Value Ref Range Status   Specimen Description BLOOD RIGHT ANTECUBITAL  Final   Special Requests BOTTLES DRAWN AEROBIC AND ANAEROBIC 5CC EACH  Final   Culture   Final    NO GROWTH < 24 HOURS Performed at [facility]    Report Status PENDING  Incomplete  MRSA PCR Screening     Status: None   Collection Time: 12/20/14 11:17 PM  Result Value Ref Range Status   MRSA by PCR NEGATIVE NEGATIVE Final    Comment:        The GeneXpert MRSA Assay (FDA approved for NASAL specimens only), is one component of a comprehensive MRSA colonization surveillance program. It is not intended to diagnose MRSA infection nor to guide or monitor treatment for MRSA infections.    Studies/Results: Dg Chest Port 1 View  12/20/2014   CLINICAL DATA:  Shortness of breath, fever, cough  EXAM: PORTABLE CHEST - 1 VIEW  COMPARISON:  12/15/2014  FINDINGS: Cardiomediastinal silhouette is stable. Nodular mass in left lower lobe again noted. No acute infiltrate or pulmonary edema. Bony thorax is stable.  IMPRESSION: No active disease.  Again noted nodular mass in left lower lobe.   Electronically Signed   By: Lahoma Crocker M.D.   On: 12/20/2014 17:13   Medications: I have reviewed the patient's current medications. Scheduled Meds: . aspirin  81 mg Oral Daily  . cyanocobalamin  1,000 mcg Intramuscular Once  . heparin  5,000 Units Subcutaneous 3 times per day  . mometasone-formoterol  2 puff Inhalation BID  . pantoprazole  40 mg Oral Daily  . piperacillin-tazobactam (ZOSYN)  IV  3.375 g Intravenous Q8H  . pravastatin  40 mg Oral q1800  . sodium chloride  3 mL Intravenous Q12H  . tiotropium  18 mcg Inhalation Daily  . vancomycin  750 mg Intravenous Q12H   Continuous Infusions:  PRN Meds:.acetaminophen **OR** acetaminophen, albuterol, ketorolac Assessment/Plan: Principal Problem:   Sepsis Active Problems:   Obesity   Coronary atherosclerosis   Severe chronic obstructive pulmonary disease   Gastroesophageal reflux disease   Prediabetes   Hyperlipidemia   Chronic diastolic CHF (congestive heart failure)   Non-small cell carcinoma of lung   Hypercapnic respiratory failure, chronic   Bilateral leg and foot pain    Macrocytosis without anemia   B12 deficiency   COLD (chronic obstructive lung disease)  59 yo female with copd 4L home o2, new diag stage II adenocarcionma of lung s/p bronch 5 days PTA, here with fever sepsis.  Sepsis of unknown origin - unclear etiology, could be 2/2 to recent bronch. - CXR just shows stable nodular mass. No new infiltrate. Vitals stable with fluids. - cont vanc + zosyn. F/up cultures - repeat CXR.  Bilateral low leg pain - could be 2/2 to diabetic neuropathy or low B12 - give 1x IV b12 today.  COPD - cont home o2 4L - cont home duonebs  Stage II adenocarcionma of lung - s/p bronch biopsy  5 days ago - f/up with Dr. Earlie Server outpatient.   Dispo: Disposition is deferred at this time, awaiting improvement of current medical problems.  Anticipated discharge in approximately 1-2 day(s).   The patient does have a current PCP Oval Linsey, MD) and does need an [facility] hospital follow-up appointment after discharge.  The patient does have transportation limitations that hinder transportation to clinic appointments.  .Services Needed at time of discharge: Y = Yes, Blank = No PT:   OT:   RN:   Equipment:   Other:     LOS: 1 day   Dellia Nims, MD 12/21/2014, 1:57 PM

## 2014-12-22 ENCOUNTER — Telehealth: Payer: Self-pay | Admitting: Medical Oncology

## 2014-12-22 ENCOUNTER — Inpatient Hospital Stay (HOSPITAL_COMMUNITY): Payer: Medicare HMO

## 2014-12-22 LAB — URINE CULTURE: CULTURE: NO GROWTH

## 2014-12-22 LAB — CBC WITH DIFFERENTIAL/PLATELET
BASOS PCT: 0 %
Basophils Absolute: 0 10*3/uL (ref 0.0–0.1)
EOS ABS: 0 10*3/uL (ref 0.0–0.7)
Eosinophils Relative: 0 %
HCT: 37.1 % (ref 36.0–46.0)
Hemoglobin: 11.3 g/dL — ABNORMAL LOW (ref 12.0–15.0)
LYMPHS ABS: 1.8 10*3/uL (ref 0.7–4.0)
Lymphocytes Relative: 18 %
MCH: 32.3 pg (ref 26.0–34.0)
MCHC: 30.5 g/dL (ref 30.0–36.0)
MCV: 106 fL — ABNORMAL HIGH (ref 78.0–100.0)
MONO ABS: 0.7 10*3/uL (ref 0.1–1.0)
MONOS PCT: 8 %
Neutro Abs: 7.1 10*3/uL (ref 1.7–7.7)
Neutrophils Relative %: 74 %
Platelets: 214 10*3/uL (ref 150–400)
RBC: 3.5 MIL/uL — ABNORMAL LOW (ref 3.87–5.11)
RDW: 17.4 % — AB (ref 11.5–15.5)
WBC: 9.6 10*3/uL (ref 4.0–10.5)

## 2014-12-22 LAB — BASIC METABOLIC PANEL
Anion gap: 3 — ABNORMAL LOW (ref 5–15)
BUN: 7 mg/dL (ref 6–20)
CALCIUM: 8.4 mg/dL — AB (ref 8.9–10.3)
CO2: 35 mmol/L — AB (ref 22–32)
CREATININE: 0.87 mg/dL (ref 0.44–1.00)
Chloride: 99 mmol/L — ABNORMAL LOW (ref 101–111)
GFR calc non Af Amer: >60 mL/min (ref >60)
Glucose, Bld: 166 mg/dL — ABNORMAL HIGH (ref 65–99)
Potassium: 4.2 mmol/L (ref 3.5–5.1)
SODIUM: 137 mmol/L (ref 135–145)

## 2014-12-22 LAB — HEMOGLOBIN A1C
Hgb A1c MFr Bld: 7 % — ABNORMAL HIGH (ref 4.8–5.6)
Mean Plasma Glucose: 154 mg/dL

## 2014-12-22 MED ORDER — LEVOFLOXACIN 750 MG PO TABS
750.0000 mg | ORAL_TABLET | Freq: Every day | ORAL | Status: DC
  Administered 2014-12-22: 750 mg via ORAL
  Filled 2014-12-22: qty 1

## 2014-12-22 MED ORDER — LEVOFLOXACIN 750 MG PO TABS: 750.0000 mg | ORAL_TABLET | Freq: Every day | ORAL | Status: AC

## 2014-12-22 MED ORDER — FUROSEMIDE 40 MG PO TABS
40.0000 mg | ORAL_TABLET | Freq: Two times a day (BID) | ORAL | Status: DC
  Administered 2014-12-22: 40 mg via ORAL
  Filled 2014-12-22 (×3): qty 1

## 2014-12-22 NOTE — Progress Notes (Signed)
Subjective: No acute events overnight, afebrile overnight.  Patient states leg pain is much improved with occasional moments of shooting pain, but otherwise not bothering her.  No nausea, vomiting, or diarrhea.  No urinary complaints.  States her shortness of breath is about at baseline.  Objective: Vital signs in last 24 hours: Filed Vitals:   12/22/14 0731 12/22/14 0818 12/22/14 0900 12/22/14 1206  BP: 126/80  126/80 105/61  Pulse: 77  85 90  Temp:  98.3 F (36.8 C)  97.5 F (36.4 C)  TempSrc:  Oral  Oral  Resp: _0 Height:      Weight:      SpO2: 94%  93% 92%   Weight change: 9 lb 15.9 oz (4.533 kg)  Intake/Output Summary (Last 24 hours) at 12/22/14 1448 Last data filed at 12/22/14 1300  Gross per 24 hour  Intake   1603 ml  Output   4700 ml  Net  -3097 ml   Physical Exam: General: Alert, cooperative, obese female lying in bed, NAD. HEENT: EOMI. Moist mucus membranes Neck: Full range of motion without pain Lungs: Clear to ascultation bilaterally, normal work of respiration, no wheezes, rales, rhonchi Heart: RRR, no murmurs, gallops, or rubs Abdomen: Soft, non-tender, non-distended, BS + Extremities: 1+ bilateral pedal edema.  Bilateral lower extremities non-tender to palpation Neurologic: Alert & oriented X3, cranial nerves II-XII intact, strength grossly intact, sensation intact to light touch  Lab Results: Basic Metabolic Panel:  Recent Labs Lab 12/21/14 0610 12/22/14 0220  NA 138 137  K 4.9 4.2  CL 102 99*  CO2 32 35*  GLUCOSE 199* 166*  BUN <5* 7  CREATININE 0.75 0.87  CALCIUM 8.3* 8.4*   Liver Function Tests: No results for input(s): AST, ALT, ALKPHOS, BILITOT, PROT, ALBUMIN in the last 168 hours. No results for input(s): LIPASE, AMYLASE in the last 168 hours. No results for input(s): AMMONIA in the last 168 hours. CBC:  Recent Labs Lab 12/20/14 1715 12/21/14 0610 12/22/14 0220  WBC 15.1* 9.6 9.6  NEUTROABS 12.3*  --  7.1  HGB 12.6  11.4* 11.3*  HCT 39.8 37.0 37.1  MCV 103.9* 104.5* 106.0*  PLT 247 239 214   Cardiac Enzymes: No results for input(s): CKTOTAL, CKMB, CKMBINDEX, TROPONINI in the last 168 hours. BNP: No results for input(s): PROBNP in the last 168 hours. D-Dimer: No results for input(s): DDIMER in the last 168 hours. CBG: No results for input(s): GLUCAP in the last 168 hours. Hemoglobin A1C:  Recent Labs Lab 12/20/14 2333  HGBA1C 7.0*   Fasting Lipid Panel: No results for input(s): CHOL, HDL, LDLCALC, TRIG, CHOLHDL, LDLDIRECT in the last 168 hours. Thyroid Function Tests:  Recent Labs Lab 12/20/14 2333  TSH 1.244   Coagulation: No results for input(s): LABPROT, INR in the last 168 hours. Anemia Panel:  Recent Labs Lab 12/20/14 2333  VITAMINB12 161*  FOLATE 8.6   Urine Drug Screen: Drugs of Abuse     Component Value Date/Time   LABOPIA NONE DETECTED 07/01/2013 1218   LABOPIA NEGATIVE 02/04/2009 2315   COCAINSCRNUR NONE DETECTED 07/01/2013 1218   COCAINSCRNUR NEGATIVE 02/04/2009 2315   LABBENZ NONE DETECTED 07/01/2013 1218   LABBENZ NEGATIVE 02/04/2009 2315   AMPHETMU NONE DETECTED 07/01/2013 1218   AMPHETMU NEGATIVE 02/04/2009 2315   THCU NONE DETECTED 07/01/2013 1218   LABBARB NONE DETECTED 07/01/2013 1218    Alcohol Level: No results for input(s): ETH in the last 168 hours. Urinalysis:  Recent Labs  Lab 12/20/14 2347  COLORURINE YELLOW  LABSPEC 1.005  PHURINE 5.5  GLUCOSEU NEGATIVE  HGBUR NEGATIVE  BILIRUBINUR NEGATIVE  KETONESUR NEGATIVE  PROTEINUR NEGATIVE  UROBILINOGEN 0.2  NITRITE NEGATIVE  LEUKOCYTESUR NEGATIVE    Micro Results: Recent Results (from the past 240 hour(s))  Blood Culture (routine x 2)     Status: None (Preliminary result)   Collection Time: 12/20/14  5:14 PM  Result Value Ref Range Status   Specimen Description BLOOD LEFT ANTECUBITAL  Final   Special Requests BOTTLES DRAWN AEROBIC AND ANAEROBIC 5 CC EACH  Final   Culture   Final     NO GROWTH 2 DAYS Performed at [facility]    Report Status PENDING  Incomplete  Blood Culture (routine x 2)     Status: None (Preliminary result)   Collection Time: 12/20/14  5:15 PM  Result Value Ref Range Status   Specimen Description BLOOD RIGHT ANTECUBITAL  Final   Special Requests BOTTLES DRAWN AEROBIC AND ANAEROBIC 5CC EACH  Final   Culture   Final    NO GROWTH 2 DAYS Performed at [facility]    Report Status PENDING  Incomplete  MRSA PCR Screening     Status: None   Collection Time: 12/20/14 11:17 PM  Result Value Ref Range Status   MRSA by PCR NEGATIVE NEGATIVE Final    Comment:        The GeneXpert MRSA Assay (FDA approved for NASAL specimens only), is one component of a comprehensive MRSA colonization surveillance program. It is not intended to diagnose MRSA infection nor to guide or monitor treatment for MRSA infections.   Urine culture     Status: None   Collection Time: 12/20/14 11:47 PM  Result Value Ref Range Status   Specimen Description URINE, CLEAN CATCH  Final   Special Requests NONE  Final   Culture NO GROWTH 1 DAY  Final   Report Status 12/22/2014 FINAL  Final   Studies/Results: Dg Chest 2 View  12/22/2014   CLINICAL DATA:  Fever. Shortness of breath. Left lower lobe lung mass.  EXAM: CHEST  2 VIEW  COMPARISON:  12/20/2014  FINDINGS: Cardiomegaly remains stable. Changes of COPD again noted. 5 cm left lower lobe mass again demonstrated. No other areas of airspace consolidation are seen. No evidence of pneumothorax or pleural effusion. Numerous surgical clips again seen in the epigastric region.  IMPRESSION: Stable 5 cm left lower lobe mass.  Stable cardiomegaly and COPD.   Electronically Signed   By: Earle Gell M.D.   On: 12/22/2014 07:55   Dg Chest Port 1 View  12/20/2014   CLINICAL DATA:  Shortness of breath, fever, cough  EXAM: PORTABLE CHEST - 1 VIEW  COMPARISON:  12/15/2014  FINDINGS: Cardiomediastinal silhouette is stable. Nodular  mass in left lower lobe again noted. No acute infiltrate or pulmonary edema. Bony thorax is stable.  IMPRESSION: No active disease.  Again noted nodular mass in left lower lobe.   Electronically Signed   By: Lahoma Crocker M.D.   On: 12/20/2014 17:13   Medications:  Scheduled Meds: . aspirin  81 mg Oral Daily  . furosemide  40 mg Oral BID  . heparin  5,000 Units Subcutaneous 3 times per day  . levofloxacin  750 mg Oral Daily  . mometasone-formoterol  2 puff Inhalation BID  . pantoprazole  40 mg Oral Daily  . pravastatin  40 mg Oral q1800  . sodium chloride  3 mL Intravenous Q12H  .  tiotropium  18 mcg Inhalation Daily   Continuous Infusions:  PRN Meds:.acetaminophen **OR** acetaminophen, albuterol, ketorolac Assessment/Plan: Principal Problem:   Sepsis Active Problems:   Obesity   Coronary atherosclerosis   Severe chronic obstructive pulmonary disease   Gastroesophageal reflux disease   Prediabetes   Hyperlipidemia   Chronic diastolic CHF (congestive heart failure)   Non-small cell carcinoma of lung   Hypercapnic respiratory failure, chronic   Bilateral leg and foot pain   Macrocytosis without anemia   B12 deficiency   COLD (chronic obstructive lung disease)  59 yo female with copd 4L home o2, new diag stage II adenocarcionma of lung s/p bronch 5 days PTA, here with fever sepsis.  Sepsis of unknown origin: unclear etiology, could be 2/2 bronchoscopy 5 days prior to admission, could be viral etiology -repeat CXR this morning shows stable 5 cm left lower lobe mass.  Stable cardiomegaly and COPD.  No other areas of consolidation are seen. -Tmax last 24 hours 98.3. -WBC on admission 15.1 >> 9.6 today (74% PMNs, 18% Lymphs, 8% Monos) -Lactic acid on admission 3.6 >> 2.8 >> 1.5 -Blood cultures x 2 NGTD -Discontinue Vancomycin and Zosyn >> Levaquin 760m PO daily for 3 more days. -IV fluids prn for hypotension  Bilateral lower extremity pain: could be related to low B12 or diabetic  neuropathy.   -symptoms much improved. -B12 x 1 given yesterday -Hgb A1c 7.0 -Toradol 373mIV q6h prn  COPD -Continue home oxygen.  Will check pulse ox while ambulating. -Continue Spiriva and Dulera  Stage II adenocarcinoma of the lung -status post bronchoscopy 5 days ago -follow up with Dr. MoEarlie Serverutpatient  CHF -weight up from 261 to 264 -restart home Lasix 4079mO bid  HLD: home meds are aspirin 19m20mily and lovastatin 40mg68mly -Pravastatin 40mg 85my -Continue aspirin  GERD: home meds are pantoprazole 40mg d45m -continue pantoprazole   Dispo: [facility] today [facility]   The patient does have a current PCP (LawrenOval [facility]seynd [facility]  The patient does have transportation limitations that hinder transportation to clinic appointments.   LOS: 2 days   Andrew Jule Ser19/2016, 2:48 PM

## 2014-12-22 NOTE — Telephone Encounter (Signed)
Admitted with sepsis -does mohamed want to see her sooner than jan? Note to [place].

## 2014-12-22 NOTE — Progress Notes (Signed)
SATURATION QUALIFICATIONS: (This note is used to comply with regulatory documentation for home oxygen)  Patient Saturations on Room Air at Rest = 81%  Patient Saturations on Room Air while Ambulating = 72%  Patient Saturations on 4 Liters of oxygen while Ambulating = 88-90%  Please briefly explain why patient needs home oxygen: Patient become SOB with activity and desats into the 70s while ambulating. Patient states she has been on O2 since 2010.

## 2014-12-22 NOTE — Progress Notes (Signed)
[facility]    Date of Admission:  12/20/2014   Total days of antibiotics 3        Day 1 levofloxacin           ID: [name] is a 59 y.o. female with newly diagnosed NSCca of lung admitted for sepsis of unknown etiology, possibly pneumonia Principal Problem:   Sepsis Active Problems:   Obesity   Coronary atherosclerosis   Severe chronic obstructive pulmonary disease   Gastroesophageal reflux disease   Prediabetes   Hyperlipidemia   Chronic diastolic CHF (congestive heart failure)   Non-small cell carcinoma of lung   Hypercapnic respiratory failure, chronic   Bilateral leg and foot pain   Macrocytosis without anemia   B12 deficiency   COLD (chronic obstructive lung disease)    Subjective: Feeling better, no fever, chills, decrease cough, leg pain improved, blood cx negative at 48hr  Medications:  . aspirin  81 mg Oral Daily  . furosemide  40 mg Oral BID  . heparin  5,000 Units Subcutaneous 3 times per day  . levofloxacin  750 mg Oral Daily  . mometasone-formoterol  2 puff Inhalation BID  . pantoprazole  40 mg Oral Daily  . pravastatin  40 mg Oral q1800  . sodium chloride  3 mL Intravenous Q12H  . tiotropium  18 mcg Inhalation Daily    Objective: Vital signs in last 24 hours: Temp:  [97.5 F (36.4 C)-98.4 F (36.9 C)] 98.4 F (36.9 C) (09/19 1526) Pulse Rate:  [75-104] 93 (09/19 1526) Resp:  [12-21] 21 (09/19 1526) BP: (105-126)/(52-80) 108/59 mmHg (09/19 1526) SpO2:  [82 %-96 %] 96 % (09/19 1526) Weight:  [264 lb 15.9 oz (120.2 kg)] 264 lb 15.9 oz (120.2 kg) (09/19 0454)  Physical Exam  Constitutional:  oriented to person, place, and time. appears well-developed and well-nourished. No distress.  HENT: [place]/AT, PERRLA, no scleral icterus Mouth/Throat: Oropharynx is clear and moist. No oropharyngeal exudate.  Cardiovascular: Normal rate, regular rhythm and normal heart sounds. Exam reveals no gallop and no friction rub.  No  murmur heard.  Pulmonary/Chest: Effort normal and breath sounds normal. No respiratory distress.  has no wheezes.  Neck = supple, no nuchal rigidity Abdominal: Soft. Bowel sounds are normal.  exhibits no distension. There is no tenderness.  Lymphadenopathy: no cervical adenopathy. No axillary adenopathy Neurological: alert and oriented to person, place, and time.  Skin: Skin is warm and dry. No rash noted. No erythema.  Ext: +1 edema LE bilaterally Psychiatric: a normal mood and affect.  behavior is normal.    Lab Results  Recent Labs  12/21/14 0610 12/22/14 0220  WBC 9.6 9.6  HGB 11.4* 11.3*  HCT 37.0 37.1  NA 138 137  K 4.9 4.2  CL 102 99*  CO2 32 35*  BUN <5* 7  CREATININE 0.75 0.87    Microbiology: 9/17 blood cx x 2 NGTD at 48hr  Studies/Results: Dg Chest 2 View  12/22/2014   CLINICAL DATA:  Fever. Shortness of breath. Left lower lobe lung mass.  EXAM: CHEST  2 VIEW  COMPARISON:  12/20/2014  FINDINGS: Cardiomegaly remains stable. Changes of COPD again noted. 5 cm left lower lobe mass again demonstrated. No other areas of airspace consolidation are seen. No evidence of pneumothorax or pleural effusion. Numerous surgical clips again seen in the epigastric region.  IMPRESSION: Stable 5 cm left lower lobe mass.  Stable cardiomegaly and COPD.   Electronically Signed  By: Earle Gell M.D.   On: 12/22/2014 07:55   - i reviewed cxr adn agree with unchanged left lower lobe mass.  Assessment/Plan: Sepsis of unknown origin: possibly pneumonia due to her pulmonary symptoms, difficult to tell on repeat CXR since appears unchanged from August CXR.  - afebrile and WBC normalizing with antibiotics -will switch from Vancomycin and Zosyn to levofloxacin 792m PO daily for 3 more days to treat for HCAP  Bilateral lower extremity pain: etiology unclear since she had acute onset nad relief once getting IVF and abtx. No signs of cellulitis  Vitamin B12 deficiency -B12 x 1 given  yesterday   COPD -Continue home oxygen. Will check pulse ox while ambulating. -Continue Spiriva and Dulera  Stage II adenocarcinoma of the lung -status post bronchoscopy 5 days ago, -follow up with Dr. MEarlie Serveroutpatient to see if needs to be moved up - need to ensure follow up visit with radiology oncology  [place] [facility] Cell: [phone]: [phone]  12/22/2014, 5:26 PM

## 2014-12-22 NOTE — Evaluation (Signed)
Physical Therapy Evaluation Patient Details Name: [name] MRN: [id number] DOB: December 10, 1955 Today's Date: 12/22/2014   History of Present Illness  Ms. Pridmore is a 59yo woman with PMH of COPD on 4L of home O2, adenocarcinoma of the lung, stage 2 with bronchoscopy 5 days ago, pulmonary HTN, CAD with DES in 2010, HfPEF who presented with fever and malaise due to sepsis.  Clinical Impression  Patient presents with deficits in endurance and strength limiting mobility at this time.  She prefers no follow up currently due to poor living situation (does not want HHPT,) and having multiple follow up appointments already due to cancer treatments.  Feel she could benefit from outpatient vestibular rehab to ensure BPPV symptoms subside.  Patient to have PCP refer if/when needed.  No further acute PT needs at d/c planned for later today.     Follow Up Recommendations No PT follow up    Equipment Recommendations  None recommended by PT    Recommendations for Other Services       Precautions / Restrictions Precautions Precautions: Fall Precaution Comments: oxygen dependent      Mobility  Bed Mobility Overal bed mobility: Modified Independent                Transfers Overall transfer level: Modified independent               General transfer comment: reported brief episode of spinning upon returning to supine  Ambulation/Gait Ambulation/Gait assistance: Supervision;Min guard Ambulation Distance (Feet): 50 Feet Assistive device: None Gait Pattern/deviations: Step-through pattern;Decreased stride length;Wide base of support     General Gait Details: in room only per patient request due to taking Lasix; SOB and eventually hypoxic ambulating in room on 4L O2.  Sat to recover edge of bed x 2 minutes to achieve Saturations 90%.  Patient reports not up much since Saturday.  Stairs            Wheelchair Mobility    Modified Rankin (Stroke Patients Only)       Balance  Overall balance assessment: Modified Independent                                           Pertinent Vitals/Pain Pain Assessment: No/denies pain    Home Living Family/patient expects to be discharged to:: Private residence Living Arrangements: Children Available Help at Discharge: Family;Available 24 hours/day Type of Home: House Home Access: Stairs to enter Entrance Stairs-Rails: None Entrance Stairs-Number of Steps: 1 Home Layout: One level Home Equipment: Tub bench;Wheelchair - [employer] - 2 wheels;Cane - single point      Prior Function Level of Independence: Independent         Comments: reports she lives in a home with grandson and other family and house has no air conditioning; states other family helping her to try to get out to two bedroom apartment with grandson     Hand Dominance        Extremity/Trunk Assessment               Lower Extremity Assessment: Overall WFL for tasks assessed         Communication   Communication: No difficulties  Cognition Arousal/Alertness: Awake/alert Behavior During Therapy: WFL for tasks assessed/performed Overall Cognitive Status: Within Functional Limits for tasks assessed  General Comments General comments (skin integrity, edema, etc.): 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  Wanted to try repositioning today.  Performed Eply for right posterior canal BPPV x 1 repitition.  Patient unwilling to perform second attempt due to fatigue and dizziness.  Issued information (referral form) for outpatient neurorehabilitation for pt to take to PCP.  Did not want to pursue HHPT nor outpatient PT right now due to having poor living conditions, and needing multiple follow up appointments right now for cancer treatment    Exercises        Assessment/Plan    PT Assessment All further PT needs can be met in the next venue of care  PT Diagnosis Generalized  weakness;Other (comment) (BPPV)   PT Problem List Decreased mobility;Decreased activity tolerance;Decreased strength;Other (comment) (vertigo)  PT Treatment Interventions     PT Goals (Current goals can be found in the Care Plan section) Acute Rehab PT Goals Patient Stated Goal: To go home PT Goal Formulation: All assessment and education complete, DC therapy    Frequency     Barriers to discharge        Co-evaluation               End of Session Equipment Utilized During Treatment: Oxygen Activity Tolerance: Patient limited by fatigue Patient left: in bed;with call bell/phone within reach           Time: 1200-1236 PT Time Calculation (min) (ACUTE ONLY): 36 min   Charges:   PT Evaluation $Initial PT Evaluation Tier I: 1 Procedure PT Treatments $Canalith Rep Proc: 8-22 mins   PT G Codes:        [name],[name] 12/24/2014, 2:59 PM  Magda Kiel, [place] December 24, 2014

## 2014-12-22 NOTE — Discharge Instructions (Signed)
Please make your hospital follow-up appointment at the [facility] that has been scheduled for you.  Please follow up with Dr. Lew Dawes office to schedule any follow up after being discharged.  You have been given a prescription for Levaquin to take for 3 more days.  A prescription has been sent to your pharmacy.

## 2014-12-22 NOTE — Discharge Summary (Signed)
Name: [name] MRN: [id number] DOB: 11/17/1955 60 y.o. PCP: Oval Linsey, MD  Date of Admission: 12/20/2014  4:28 PM Date of Discharge: 12/22/2014 Attending Physician: No att. providers found  Discharge Diagnosis: Principal Problem:   Sepsis Active Problems:   Obesity   Coronary atherosclerosis   Severe chronic obstructive pulmonary disease   Gastroesophageal reflux disease   Prediabetes   Hyperlipidemia   Chronic diastolic CHF (congestive heart failure)   Non-small cell carcinoma of lung   Hypercapnic respiratory failure, chronic   Bilateral leg and foot pain   Macrocytosis without anemia   B12 deficiency   COLD (chronic obstructive lung disease)  Discharge Medications:   Medication List    TAKE these medications        albuterol (2.5 MG/3ML) 0.083% nebulizer solution  Commonly known as:  PROVENTIL  Take 3 mLs (2.5 mg total) by nebulization every 6 (six) hours as needed for shortness of breath.     albuterol 108 (90 BASE) MCG/ACT inhaler  Commonly known as:  VENTOLIN HFA  Inhale 2 puffs into the lungs every 6 (six) hours as needed for shortness of breath.     aspirin 81 MG chewable tablet  Chew 1 tablet (81 mg total) by mouth daily.     Fluticasone-Salmeterol 250-50 MCG/DOSE Aepb  Commonly known as:  ADVAIR DISKUS  Inhale 1 puff into the lungs 2 (two) times daily.     furosemide 40 MG tablet  Commonly known as:  LASIX  Take 1 tablet (40 mg total) by mouth 2 (two) times daily.     levofloxacin 750 MG tablet  Commonly known as:  LEVAQUIN  Take 1 tablet (750 mg total) by mouth daily.  Start taking on:  12/23/2014     lovastatin 40 MG tablet  Commonly known as:  MEVACOR  Take 1 tablet (40 mg total) by mouth at bedtime.     pantoprazole 40 MG tablet  Commonly known as:  PROTONIX  Take 1 tablet (40 mg total) by mouth daily.     Potassium Chloride ER 20 MEQ Tbcr  Take 40 mEq by mouth daily.     tiotropium 18 MCG inhalation capsule  Commonly known  as:  SPIRIVA HANDIHALER  Place 1 capsule (18 mcg total) into inhaler and inhale daily.        Disposition and follow-up:   [name] was discharged from [facility] in Stable condition.  At the hospital follow up visit please address:  1.  Patients' completion of 3 more days Levaquin.  Please ask her whether she has been able to get in touch with Dr. Julien Nordmann about any follow up, if needed.  We tried to call their office to see about follow up but were only able to leave a message.  Patient instructed to reach out to his office to see if they wanted her to come in sooner.  Please ensure she has follow up with radiation-oncology.  Please address her Hgb A1c of 7.0 with her and whether starting any treatment is appropriate at this time.    2.  Labs / imaging needed at time of follow-up: CBC  3.  Pending labs/ test needing follow-up: blood cultures  Follow-up Appointments: Follow-up Information    Follow up with Osa Craver, MD On 12/30/2014.   Specialty:  Internal Medicine   Why:  Appointment time @ 3:45 pm   Contact information:   [place]  [id number] [phone]  Discharge Instructions:   Consultations:    Procedures Performed:  Dg Chest 2 View  12/22/2014   CLINICAL DATA:  Fever. Shortness of breath. Left lower lobe lung mass.  EXAM: CHEST  2 VIEW  COMPARISON:  12/20/2014  FINDINGS: Cardiomegaly remains stable. Changes of COPD again noted. 5 cm left lower lobe mass again demonstrated. No other areas of airspace consolidation are seen. No evidence of pneumothorax or pleural effusion. Numerous surgical clips again seen in the epigastric region.  IMPRESSION: Stable 5 cm left lower lobe mass.  Stable cardiomegaly and COPD.   Electronically Signed   By: Earle Gell M.D.   On: 12/22/2014 07:55   Dg Chest 2 View  12/12/2014   CLINICAL DATA:  Preoperative evaluation for left lung mass.  EXAM: CHEST  2 VIEW  COMPARISON:  11/18/2014  FINDINGS:  Again noted is a round mass in the left lower chest, measuring over 4 cm. Heart size is slightly enlarged. Prominent lung markings are unchanged. No evidence for pulmonary edema or focal airspace disease. Multiple surgical clips in the upper abdomen. Trachea is midline. No large pleural effusions. Negative for a pneumothorax.  IMPRESSION: Left lung mass without acute chest findings.   Electronically Signed   By: Markus Daft M.D.   On: 12/12/2014 15:47   Ct Head W Wo Contrast  12/11/2014   CLINICAL DATA:  Newly diagnosed left lower lobe non-small-cell lung cancer. Chronic headaches. Staging.  EXAM: CT HEAD WITHOUT AND WITH CONTRAST  TECHNIQUE: Contiguous axial images were obtained from the base of the skull through the vertex without and with intravenous contrast  CONTRAST:  129m OMNIPAQUE IOHEXOL 300 MG/ML  SOLN  COMPARISON:  Noncontrast head CT 06/30/2013  FINDINGS: There is no evidence of acute cortical infarct, intracranial hemorrhage, mass, midline shift, or extra-axial fluid collection. Ventricles and sulci are normal. Scattered areas of hypoattenuation in the subcortical and deep cerebral white matter bilaterally have mildly progressed from the prior study and are nonspecific but compatible with mild chronic small vessel ischemic disease. No abnormal enhancement is identified.  Visualized orbits are unremarkable. Visualized paranasal sinuses and mastoid air cells are clear. No destructive skull lesion is seen. There is mild-to-moderate calcified atherosclerosis at the skull base.  IMPRESSION: 1. No evidence of intracranial metastases. 2. Mild chronic small vessel ischemic disease.   Electronically Signed   By: ALogan BoresM.D.   On: 12/11/2014 08:41   Dg Chest Port 1 View  12/20/2014   CLINICAL DATA:  Shortness of breath, fever, cough  EXAM: PORTABLE CHEST - 1 VIEW  COMPARISON:  12/15/2014  FINDINGS: Cardiomediastinal silhouette is stable. Nodular mass in left lower lobe again noted. No acute infiltrate  or pulmonary edema. Bony thorax is stable.  IMPRESSION: No active disease.  Again noted nodular mass in left lower lobe.   Electronically Signed   By: LLahoma CrockerM.D.   On: 12/20/2014 17:13   Dg Chest Port 1 View  12/15/2014   CLINICAL DATA:  Status post bronchoscopy and left lung biopsy.  EXAM: PORTABLE CHEST - 1 VIEW  COMPARISON:  Chest x-ray 12/12/2014  FINDINGS: The heart is enlarged but stable. Persistent left lower lobe lung mass. Low lung volumes with vascular crowding and atelectasis. No postprocedural pneumothorax or pleural effusion.  IMPRESSION: No postprocedural pneumothorax or pleural effusion.   Electronically Signed   By: PMarijo SanesM.D.   On: 12/15/2014 13:29    2D Echo: none   Cardiac Cath: none  Admission HPI: Ms.  Ruppert is a 59 year old lady with history of COPD on 4L O2 at home, newly diagnosed stage II adenocarcinoma of the lung status post bronchoscopy with biopsy 5 days ago, pulmonary hypertension, coronary artery disease status post DES placement 2010, heart failure with preserved ejection fraction, presenting with subjective fever and malaise. When she awoke this morning, she started feeling cold and tired. She usually sits at home with a fan on her because she's naturally so hot, but she had to wrap herself up in blankets. She also complained of a mild bifrontal headache that improved since she's been in the hospital, intermittent diarrhea over the last few months, and bilateral stabbing leg pain over the last month. She denied other symptoms such as cough, nausea, headache, dysuria, or localizing signs of infection. Her grandson said she looked bad so he called an ambulance to come her get her. In the emergency department, she was febrile to 102, tachycardic to 98, with leukocytosis of 15k and a lactic acid of 3.6, so sepsis protocol was initiated. Chest x-ray showed a left-sided lung mass, unchanged from prior exam, without evidence of acute process. Urinalysis and blood  cultures were obtained.  Hospital Course by problem list: Principal Problem:   Sepsis Active Problems:   Obesity   Coronary atherosclerosis   Severe chronic obstructive pulmonary disease   Gastroesophageal reflux disease   Prediabetes   Hyperlipidemia   Chronic diastolic CHF (congestive heart failure)   Non-small cell carcinoma of lung   Hypercapnic respiratory failure, chronic   Bilateral leg and foot pain   Macrocytosis without anemia   B12 deficiency   COLD (chronic obstructive lung disease)   1. Sepsis of unknown origin: One consideration to her sepsis could be an occult infection within her lung mass as she underwent bronchoscopy with biopsy 5 days ago.  Her chest x-ray did not show any changes from previous imaging, but it is possible that there was an infection within the mass.  This may also have beeen a viral process given her acute onset and generalized malaise.  She was started empirically on vancomycin and Zosyn, blood cultures x 2, and urine cultures were obtained.  Her urine culture did not reveal any source of infection and her blood cultures have shown no growth to date.  Her WBC on admission was 15.1 and trended down to 9.6 the following day and stayed at 9.6 on day of discharge.  We repeated a chest x-ray on 9/19 that did not show any new consolidation or changes from previous chest x-ray. On day of discharge, we converted her to PO Levaquin 790m daily for 3 more days to complete 5 days of antibiotic therapy.  2. Bilateral lower extremity pain: potentially secondary to diabetic neuropathy given her distribution and description of stabbing pain.  Her vitamin B12 level was also low and this was also supplemented.  However, she states that her leg pain improved prior to receiving dose of Vitamin B12 so it is unclear if this was the source of her pain.  We obtained a Hgb A1c at admission and this was found to be 7.0.   Her pain from this was also controlled with Toradol 397mIV  every 6 hours as needed.  3. COPD: this was not an acute issue.  We continued her home oxygen and her home inhalers.  We had nursing complete a pulse-ox check with ambulation with the following results:  SATURATION QUALIFICATIONS: (This note is used to comply with regulatory documentation for  home oxygen)  Patient Saturations on Room Air at Rest = 81%  Patient Saturations on Room Air while Ambulating = 72%  Patient Saturations on 4 Liters of oxygen while Ambulating = 88-90%  Please briefly explain why patient needs home oxygen: Patient become SOB with activity and desats into the 70s while ambulating. Patient states she has been on O2 since 2010.  4. Stage II adenocarcinoma of lung s/p bronchoscopy with biopsy 5 days prior to admission:  Follow up with Dr. Earlie Server outpatient as needed.  Discharge Vitals:   BP 108/59 mmHg  Pulse 93  Temp(Src) 98.4 F (36.9 C) (Oral)  Resp 21  Ht _0  (1.651 m)  Wt 264 lb 15.9 oz (120.2 kg)  BMI 44.10 kg/m2  SpO2 96%  Discharge Labs:  Results for orders placed or performed during the hospital encounter of 12/20/14 (from the past 24 hour(s))  CBC with Differential/Platelet     Status: Abnormal   Collection Time: 12/22/14  2:20 AM  Result Value Ref Range   WBC 9.6 4.0 - 10.5 K/uL   RBC 3.50 (L) 3.87 - 5.11 MIL/uL   Hemoglobin 11.3 (L) 12.0 - 15.0 g/dL   HCT 37.1 36.0 - 46.0 %   MCV 106.0 (H) 78.0 - 100.0 fL   MCH 32.3 26.0 - 34.0 pg   MCHC 30.5 30.0 - 36.0 g/dL   RDW 17.4 (H) 11.5 - 15.5 %   Platelets 214 150 - 400 K/uL   Neutrophils Relative % 74 %   Neutro Abs 7.1 1.7 - 7.7 K/uL   Lymphocytes Relative 18 %   Lymphs Abs 1.8 0.7 - 4.0 K/uL   Monocytes Relative 8 %   Monocytes Absolute 0.7 0.1 - 1.0 K/uL   Eosinophils Relative 0 %   Eosinophils Absolute 0.0 0.0 - 0.7 K/uL   Basophils Relative 0 %   Basophils Absolute 0.0 0.0 - 0.1 K/uL  Basic metabolic panel     Status: Abnormal   Collection Time: 12/22/14  2:20 AM  Result Value Ref  Range   Sodium 137 135 - 145 mmol/L   Potassium 4.2 3.5 - 5.1 mmol/L   Chloride 99 (L) 101 - 111 mmol/L   CO2 35 (H) 22 - 32 mmol/L   Glucose, Bld 166 (H) 65 - 99 mg/dL   BUN 7 6 - 20 mg/dL   Creatinine, Ser 0.87 0.44 - 1.00 mg/dL   Calcium 8.4 (L) 8.9 - 10.3 mg/dL   GFR calc non Af Amer >60 >60 mL/min   GFR calc Af Amer >60 >60 mL/min   Anion gap 3 (L) 5 - 15    Signed: Jule Ser, DO 12/22/2014, 5:56 PM    Services Ordered on Discharge: none Equipment Ordered on Discharge: none

## 2014-12-22 NOTE — Progress Notes (Signed)
Discharge instructions reviewed with the patient and her grandson, [name], who was at bedside. Patient verbalizes understanding of all discharge instructions and understands prescription was faxed to her pharmacy for her to pick up. VSS, pt is in no acute distress.   Grandson brought her the home O2 she uses, and she was discharged via wheelchair.

## 2014-12-23 ENCOUNTER — Telehealth: Payer: Self-pay | Admitting: Medical Oncology

## 2014-12-23 NOTE — Telephone Encounter (Signed)
-----  Message from Curt Bears, MD sent at 12/22/2014  5:06 PM EDT ----- Regarding: RE: admitted with sepsis unknow etiolo I just saw her few days ago. No need for me to see her anytime soon. Thank you ----- Message -----    From: Ardeen Garland, RN    Sent: 12/22/2014   3:19 PM      To: Curt Bears, MD Subject: admitted with sepsis unknow etiolo             Internal med resident called to see if you want to see her sooner than jan? [phone] or [phone]

## 2014-12-23 NOTE — Telephone Encounter (Signed)
Internal med resident notified.

## 2014-12-24 ENCOUNTER — Other Ambulatory Visit: Payer: Self-pay | Admitting: Internal Medicine

## 2014-12-24 ENCOUNTER — Telehealth: Payer: Self-pay | Admitting: Medical Oncology

## 2014-12-24 NOTE — Telephone Encounter (Signed)
asking about f/u to radiation-note to [facility].

## 2014-12-25 LAB — CULTURE, BLOOD (ROUTINE X 2)
Culture: NO GROWTH
Culture: NO GROWTH

## 2014-12-29 ENCOUNTER — Telehealth: Payer: Self-pay | Admitting: *Deleted

## 2014-12-29 NOTE — Telephone Encounter (Signed)
Pt called for HFU Transition of Care.  No answer.  Message left that she has an appointment tomorrow in clinic.

## 2014-12-30 ENCOUNTER — Emergency Department (HOSPITAL_COMMUNITY): Payer: Commercial Managed Care - HMO

## 2014-12-30 ENCOUNTER — Ambulatory Visit: Payer: Commercial Managed Care - HMO | Admitting: Internal Medicine

## 2014-12-30 ENCOUNTER — Emergency Department (HOSPITAL_COMMUNITY)
Admission: EM | Admit: 2014-12-30 | Discharge: 2014-12-30 | Disposition: A | Discharge status: Home / Self Care | Payer: Commercial Managed Care - HMO | Attending: Physician Assistant | Admitting: Physician Assistant

## 2014-12-30 ENCOUNTER — Encounter (HOSPITAL_COMMUNITY): Payer: Self-pay | Admitting: *Deleted

## 2014-12-30 DIAGNOSIS — I2510 Atherosclerotic heart disease of native coronary artery without angina pectoris: Secondary | ICD-10-CM | POA: Insufficient documentation

## 2014-12-30 DIAGNOSIS — R079 Chest pain, unspecified: Secondary | ICD-10-CM | POA: Insufficient documentation

## 2014-12-30 DIAGNOSIS — R0602 Shortness of breath: Secondary | ICD-10-CM

## 2014-12-30 DIAGNOSIS — K219 Gastro-esophageal reflux disease without esophagitis: Secondary | ICD-10-CM | POA: Diagnosis not present

## 2014-12-30 DIAGNOSIS — E785 Hyperlipidemia, unspecified: Secondary | ICD-10-CM | POA: Diagnosis not present

## 2014-12-30 DIAGNOSIS — Z7982 Long term (current) use of aspirin: Secondary | ICD-10-CM | POA: Diagnosis not present

## 2014-12-30 DIAGNOSIS — Z79899 Other long term (current) drug therapy: Secondary | ICD-10-CM | POA: Diagnosis not present

## 2014-12-30 DIAGNOSIS — J441 Chronic obstructive pulmonary disease with (acute) exacerbation: Secondary | ICD-10-CM | POA: Diagnosis not present

## 2014-12-30 DIAGNOSIS — F329 Major depressive disorder, single episode, unspecified: Secondary | ICD-10-CM | POA: Diagnosis not present

## 2014-12-30 DIAGNOSIS — Z720 Tobacco use: Secondary | ICD-10-CM | POA: Diagnosis not present

## 2014-12-30 DIAGNOSIS — M1990 Unspecified osteoarthritis, unspecified site: Secondary | ICD-10-CM | POA: Diagnosis not present

## 2014-12-30 DIAGNOSIS — R0789 Other chest pain: Secondary | ICD-10-CM | POA: Diagnosis not present

## 2014-12-30 DIAGNOSIS — J189 Pneumonia, unspecified organism: Secondary | ICD-10-CM | POA: Diagnosis not present

## 2014-12-30 DIAGNOSIS — Z955 Presence of coronary angioplasty implant and graft: Secondary | ICD-10-CM | POA: Insufficient documentation

## 2014-12-30 DIAGNOSIS — E669 Obesity, unspecified: Secondary | ICD-10-CM | POA: Insufficient documentation

## 2014-12-30 DIAGNOSIS — R300 Dysuria: Secondary | ICD-10-CM | POA: Diagnosis not present

## 2014-12-30 DIAGNOSIS — R0600 Dyspnea, unspecified: Secondary | ICD-10-CM | POA: Diagnosis present

## 2014-12-30 LAB — I-STAT TROPONIN, ED
Troponin i, poc: 0.01 ng/mL (ref 0.00–0.08)
Troponin i, poc: 0 ng/mL (ref 0.00–0.08)

## 2014-12-30 LAB — CBC WITH DIFFERENTIAL/PLATELET
BASOS ABS: 0 10*3/uL (ref 0.0–0.1)
BASOS PCT: 0 %
Eosinophils Absolute: 0.2 10*3/uL (ref 0.0–0.7)
Eosinophils Relative: 1 %
HEMATOCRIT: 36.8 % (ref 36.0–46.0)
Hemoglobin: 11.5 g/dL — ABNORMAL LOW (ref 12.0–15.0)
LYMPHS PCT: 9 %
Lymphs Abs: 1 10*3/uL (ref 0.7–4.0)
MCH: 33 pg (ref 26.0–34.0)
MCHC: 31.3 g/dL (ref 30.0–36.0)
MCV: 105.7 fL — AB (ref 78.0–100.0)
MONO ABS: 1.5 10*3/uL — AB (ref 0.1–1.0)
Monocytes Relative: 13 %
NEUTROS ABS: 9 10*3/uL — AB (ref 1.7–7.7)
NEUTROS PCT: 77 %
Platelets: 205 10*3/uL (ref 150–400)
RBC: 3.48 MIL/uL — AB (ref 3.87–5.11)
RDW: 16.7 % — AB (ref 11.5–15.5)
WBC: 11.6 10*3/uL — AB (ref 4.0–10.5)

## 2014-12-30 LAB — BASIC METABOLIC PANEL
ANION GAP: 6 (ref 5–15)
BUN: <5 mg/dL — ABNORMAL LOW (ref 6–20)
CALCIUM: 8.7 mg/dL — AB (ref 8.9–10.3)
CO2: 35 mmol/L — ABNORMAL HIGH (ref 22–32)
Chloride: 98 mmol/L — ABNORMAL LOW (ref 101–111)
Creatinine, Ser: 0.7 mg/dL (ref 0.44–1.00)
GLUCOSE: 149 mg/dL — AB (ref 65–99)
POTASSIUM: 3.9 mmol/L (ref 3.5–5.1)
SODIUM: 139 mmol/L (ref 135–145)

## 2014-12-30 LAB — URINALYSIS, ROUTINE W REFLEX MICROSCOPIC
BILIRUBIN URINE: NEGATIVE
Glucose, UA: NEGATIVE mg/dL
KETONES UR: NEGATIVE mg/dL
Leukocytes, UA: NEGATIVE
Nitrite: NEGATIVE
PH: 8 (ref 5.0–8.0)
Protein, ur: NEGATIVE mg/dL
SPECIFIC GRAVITY, URINE: 1.042 — AB (ref 1.005–1.030)
UROBILINOGEN UA: 0.2 mg/dL (ref 0.0–1.0)

## 2014-12-30 LAB — I-STAT ARTERIAL BLOOD GAS, ED
ACID-BASE EXCESS: 8 mmol/L — AB (ref 0.0–2.0)
BICARBONATE: 34.9 meq/L — AB (ref 20.0–24.0)
O2 Saturation: 91 %
PH ART: 7.384 (ref 7.350–7.450)
TCO2: 37 mmol/L (ref 0–100)
pCO2 arterial: 58.4 mmHg (ref 35.0–45.0)
pO2, Arterial: 63 mmHg — ABNORMAL LOW (ref 80.0–100.0)

## 2014-12-30 LAB — URINE MICROSCOPIC-ADD ON

## 2014-12-30 LAB — BRAIN NATRIURETIC PEPTIDE: B NATRIURETIC PEPTIDE 5: 168.9 pg/mL — AB (ref 0.0–100.0)

## 2014-12-30 MED ORDER — IOHEXOL 350 MG/ML SOLN
100.0000 mL | Freq: Once | INTRAVENOUS | Status: AC | PRN
Start: 2014-12-30 — End: 2014-12-30
  Administered 2014-12-30: 100 mL via INTRAVENOUS

## 2014-12-30 MED ORDER — ASPIRIN 325 MG PO TABS
325.0000 mg | ORAL_TABLET | Freq: Every day | ORAL | Status: DC
  Administered 2014-12-30: 325 mg via ORAL
  Filled 2014-12-30: qty 1

## 2014-12-30 NOTE — Discharge Instructions (Signed)
We're very sorry about your cancer. We think that the chest pain is likely from her cancer pain. Please use additional oxygen at home as needed. Please follow-up with your rad onc later this week as planned.

## 2014-12-30 NOTE — ED Provider Notes (Signed)
CSN: [id number]     Arrival date & time 12/30/14  1423 History   First MD Initiated Contact with Patient 12/30/14 1456     Chief Complaint  Patient presents with  . Respiratory Distress   Patient is a 59 y.o. female presenting with shortness of breath.  Shortness of Breath Severity:  Mild Onset quality:  Gradual Timing:  Constant Progression:  Unchanged Chronicity:  Chronic Context: activity   Context: not URI and not weather changes   Relieved by:  Nothing Associated symptoms: chest pain   Associated symptoms: no abdominal pain, no cough, no diaphoresis, no fever, no headaches, no neck pain, no sore throat, no syncope, no swollen glands, no vomiting and no wheezing   Associated symptoms comment:  Right sided chest pain Risk factors: no hx of PE/DVT     Past Medical History  Diagnosis Date  . Coronary artery disease     S/P PCI of LAD with DES (12/2008). Total occlusion of RCA noted at that time., medically managed. ACS ruled out 03/2009 with Lexiscan myoview . Followed by [place].  . Pulmonary hypertension     2-D Echo (05/1113) - Systolic pressure was moderately increased. PA peak pressure  11mHg. 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 Followed by Dr. WJoya Gaskins(Velora Heckler  . Diastolic dysfunction     2-D Echo (12/2008) - Normal LV Systolic funciton with EF 60-65%. Grade 1 diastolid dysfunction. No regional wall motion abnormalities. Moderate pulmonary HTN with PA peak pressure 59mg.  . Marland KitchenOPD (chronic obstructive pulmonary disease)     Severe. Gold Stage IV.  PFTs (12/2008) - severe obstructive airway disease. Active tobacco use. Requires 4L O2 at home.  . Pulmonary nodule, right     Small right middle lobe nodule. Stable as of 12/2008.  . Marland Kitchenrediabetes 12/2008    HgbA1c 6.4 (12/2008)  . Hx MRSA infection     Recurrent MRSA thigh abscesses.  . Tobacco abuse     Ongoing.  .  Obesity   . Hyperlipidemia   . GERD (gastroesophageal reflux disease)     S/P Nissen fundoplication.  . CHF (congestive heart failure)   . H/O right coronary artery stent placement 2010  . On home oxygen therapy since 2010    4L all the time  . Shortness of breath dyspnea     with a lot of exertion; if fluid builds up  . Pneumonia   . Depression   . Arthritis   . Cancer     lung cancer  . Full dentures    Past Surgical History  Procedure Laterality Date  . Total abdominal hysterectomy w/ bilateral salpingoophorectomy    . Nissen fundoplication    . Right heart catheterization N/A 11/09/2012    Procedure: RIGHT HEART CATH;  Surgeon: DaLarey DresserMD;  [facility];  Service: Cardiovascular;  Laterality: N/A;  . Appendectomy    . Tonsillectomy    [facility]  25 years ago    Lumpectomy (biopsy negative)  . [facility]    . Knee cartilage surgery Right 10 years ago approx  . Tubal ligation  1979  . Video bronchoscopy with endobronchial ultrasound N/A 12/15/2014    Procedure: VIDEO BRONCHOSCOPY WITH ENDOBRONCHIAL ULTRASOUND;  Surgeon: PeIvin PootMD;  Location: [facility] OR;  Service: Thoracic;  Laterality: N/A;   Family History  Problem Relation Age of Onset  . Heart disease Mother 52    Deceased from MI at 84yo  . Hypertension Mother   . Heart disease Father 51    Deceased of MI age 45yo  . Hypertension Father   . Hypertension Brother   . Lung cancer      Grandmother   Social History  Substance Use Topics  . Smoking status: Current Every Day Smoker -- 0.50 packs/day for 45 years    Types: Cigarettes    Start date: 04/04/1969  . Smokeless tobacco: Never Used     Comment: 1/2 pack or less. 1 pack to .5 pack per day  . Alcohol Use: No   OB History    No data available     Review of Systems  Constitutional: Negative for fever and diaphoresis.   HENT: Negative for sore throat.   Respiratory: Positive for shortness of breath. Negative for cough and wheezing.   Cardiovascular: Positive for chest pain and leg swelling. Negative for syncope.  Gastrointestinal: Negative for vomiting, abdominal pain, diarrhea and constipation.  Genitourinary: Positive for dysuria. Negative for flank pain, decreased urine volume and difficulty urinating.  Musculoskeletal: Negative for neck pain.  Neurological: Negative for headaches.  All other systems reviewed and are negative.     Allergies  Fluconazole and Atorvastatin  Home Medications   Prior to Admission medications   Medication Sig Start Date End Date Taking? Authorizing Provider  albuterol (PROVENTIL) (2.5 MG/3ML) 0.083% nebulizer solution Take 3 mLs (2.5 mg total) by nebulization every 6 (six) hours as needed for shortness of breath. 09/05/14   Oval Linsey, MD  albuterol (VENTOLIN HFA) 108 (90 BASE) MCG/ACT inhaler Inhale 2 puffs into the lungs every 6 (six) hours as needed for shortness of breath. 12/05/14   Oval Linsey, MD  aspirin 81 MG chewable tablet Chew 1 tablet (81 mg total) by mouth daily. Patient taking differently: Chew 81 mg by mouth at bedtime.  05/27/14   Norman Herrlich, MD  Fluticasone-Salmeterol (ADVAIR DISKUS) 250-50 MCG/DOSE AEPB Inhale 1 puff into the lungs 2 (two) times daily. 09/05/14   Dellia Nims, MD  furosemide (LASIX) 40 MG tablet Take 1 tablet (40 mg total) by mouth 2 (two) times daily. 09/27/14   Erick Colace, NP  lovastatin (MEVACOR) 40 MG tablet Take 1 tablet (40 mg total) by mouth at bedtime. 09/11/14 09/11/15  Corky Sox, MD  pantoprazole (PROTONIX) 40 MG tablet Take 1 tablet (40 mg total) by mouth daily. 12/05/14   Oval Linsey, MD  Potassium Chloride ER 20 MEQ TBCR Take 40 mEq by mouth daily. Patient taking differently: Take 20 mEq by mouth 2 (two) times daily.  10/30/14   Linton Flemings, MD  tiotropium (SPIRIVA HANDIHALER) 18 MCG inhalation capsule Place 1  capsule (18 mcg total) into inhaler and inhale daily. 09/05/14   Tasrif Ahmed, MD   BP 155/78 mmHg  Pulse 92  Temp(Src) 98.8 F (37.1 C) (Oral)  Resp 22  Ht _0  (1.651 m)  Wt 250 lb (113.399 kg)  BMI 41.60 kg/m2  SpO2 96% Physical Exam  Constitutional: She is oriented to person, place, and time. She appears well-developed and well-nourished. No distress.  HENT:  Head: Normocephalic.  Eyes: Pupils are equal, round, and reactive to light.  Neck: Normal range of motion.  Cardiovascular: Normal rate.   Pulmonary/Chest: Effort normal. No respiratory distress. She has no  wheezes. She exhibits no tenderness.  Decreased movement in left lower lobe.   Minimal crackles at bases bilaterally  Abdominal: Soft. She exhibits no distension. There is no tenderness. There is no rebound and no guarding.  Musculoskeletal: Normal range of motion. She exhibits edema.  Neurological: She is alert and oriented to person, place, and time. No cranial nerve deficit.  Skin: Skin is warm. She is not diaphoretic. No erythema.  Psychiatric: She has a normal mood and affect.  Nursing note and vitals reviewed.   ED Course  Procedures (including critical care time) Labs Review Labs Reviewed  BASIC METABOLIC PANEL - Abnormal; Notable for the following:    Chloride 98 (*)    CO2 35 (*)    Glucose, Bld 149 (*)    BUN <5 (*)    Calcium 8.7 (*)    All other components within normal limits  CBC WITH DIFFERENTIAL/PLATELET - Abnormal; Notable for the following:    WBC 11.6 (*)    RBC 3.48 (*)    Hemoglobin 11.5 (*)    MCV 105.7 (*)    RDW 16.7 (*)    Neutro Abs 9.0 (*)    Monocytes Absolute 1.5 (*)    All other components within normal limits  BRAIN NATRIURETIC PEPTIDE - Abnormal; Notable for the following:    B Natriuretic Peptide 168.9 (*)    All other components within normal limits  I-STAT ARTERIAL BLOOD GAS, ED - Abnormal; Notable for the following:    pCO2 arterial 58.4 (*)    pO2, Arterial 63.0  (*)    Bicarbonate 34.9 (*)    Acid-Base Excess 8.0 (*)    All other components within normal limits  URINALYSIS, ROUTINE W REFLEX MICROSCOPIC (NOT AT [facility])  Randolm Idol, ED  Randolm Idol, ED    Imaging Review Dg Chest 2 View  12/30/2014   CLINICAL DATA:  Chest pain for 2 days.  EXAM: CHEST  2 VIEW  COMPARISON:  12/22/2014  FINDINGS: Trace bilateral pleural effusions. Bilateral interstitial thickening. Persistent left lower lobe lung mass. No pneumothorax. Stable cardiomegaly. No acute osseous abnormality.  IMPRESSION: 1. Cardiomegaly with trace bilateral pleural effusions and bilateral interstitial prominence concerning for mild CHF. 2. Persistent left lower lobe lung mass.   Electronically Signed   By: Kathreen Devoid   On: 12/30/2014 16:26   Ct Angio Chest Pe W/cm &/or Wo Cm  12/30/2014   CLINICAL DATA:  Chest pain and shortness of breath  EXAM: CT ANGIOGRAPHY CHEST WITH CONTRAST  TECHNIQUE: Multidetector CT imaging of the chest was performed using the standard protocol during bolus administration of intravenous contrast. Multiplanar CT image reconstructions and MIPs were obtained to evaluate the vascular anatomy.  CONTRAST:  184m OMNIPAQUE IOHEXOL 350 MG/ML SOLN  COMPARISON:  Chest radiograph December 30, 2014 ; chest CT October 30, 2014 ; PET-CT November 12, 2014  FINDINGS: There is no demonstrable pulmonary embolus. There is atherosclerotic change in the aorta. No aneurysm or dissection is seen. There is mild atherosclerotic change in the visualized great vessels without hemodynamically significant obstruction.  There is prominence of the central pulmonary arteries with peripheral tapering suggesting pulmonary arterial hypertension.  The known left lower lobe mass now measures 6.5 x 5.8 cm, larger than on recent PET-CT examination. This mass surrounds but does not frankly invade several peripheral pulmonary arterial branches in the left lower lobe.  There is no frank edema or consolidation.  There is inferior posterior left base atelectasis. There are no blastic  or lytic bone lesions.  Thyroid appears unremarkable. There are small mediastinal lymph nodes throughout the chest. There is a lymph node in the left hilar region measuring 1.6 x 1.3 cm.  There is mild pericardial thickening. There is a small hiatal hernia. There is extensive calcification in the left anterior descending coronary artery.  In the visualized upper abdomen, there are multiple surgical clips. There is atherosclerotic change in the abdominal aorta.  There are no blastic or lytic bone lesions.  Review of the MIP images confirms the above findings.  IMPRESSION: No demonstrable pulmonary embolus.  Left lower lobe mass continues to enlarge consistent with neoplasia. There is an enlarged left hilar lymph node.  Left base atelectasis noted.  Slight pericardial thickening.  Suspect pulmonary arterial hypertension. There is extensive left anterior descending coronary artery calcification.   Electronically Signed   By: Lowella Grip III M.D.   On: 12/30/2014 18:35   I have personally reviewed and evaluated these images and lab results as part of my medical decision-making.    EKG Interpretation None      MDM   Ms. Tiwana Chavis is a 84-year-old female with multiple chemistries including congestive heart failure, COPD, recently diagnosed lung cancer presents to emergency department today with increasing sputum production shortness of breath and right-sided chest pain that she states is constant without exacerbating or relieving symptoms. Patient also presented to the hospital similar fashion recently and was admitted for pneumonia. Patient was sent home on 3 days of Levaquin and since that time she's been doing well until this morning. Patient also endorses a slight increase in her leg swelling.  Patient well appearing. Labs and oxygen saturation at baseline. Patient has slight decrease in work of breathing but has no  pneumonia, no PE. This is more than likely due to her cancer burden. Patient would like to be discharged home. Patient encouraged to follow up with her PCP. Patient in agreement with plan and discharged home.    Final diagnoses:  SOB (shortness of breath)  Chest pain, unspecified chest pain type    Roberto Scales, MD 12/31/14 [place], MD 01/01/15 1454

## 2014-12-30 NOTE — ED Notes (Signed)
Pt. Left with all belongings. Discharge instructions were reviewed and all questions were answered

## 2014-12-30 NOTE — ED Notes (Signed)
Pt was diagnosed with sepsis 2 weeks ago, and was hospitalized at [facility] long.  Pt said she has felt well since then until yesterday.  Pt started to aching and having the shakes.  She had a sudden onset of dull right sided CP.  She called EMS today sfter she noticed that she had bilateral edema on both legs.  Pt has Hx of COPD and is on 4L of continuous O2 at home.  Pt was given a duoneb in route and her pain has decreased form 8-6. VS are as follows: BP: 126/78 HR: 90 Resp:22 SPO2 93% on 4L.

## 2014-12-30 NOTE — ED Notes (Signed)
Pt used bedside commode. Pt did not follow instructed to obtain UA, pt contaminated urine sample with bath tissue. Will continue to monitor and reassess for another UA

## 2014-12-30 NOTE — ED Notes (Signed)
Tried the in and out cath on patient pt did not corporate patient would not let me notified RN Randall Hiss

## 2015-01-01 ENCOUNTER — Ambulatory Visit: Payer: Commercial Managed Care - HMO

## 2015-01-01 ENCOUNTER — Ambulatory Visit
Admission: RE | Admit: 2015-01-01 | Discharge: 2015-01-01 | Disposition: A | Discharge status: Home / Self Care | Payer: Commercial Managed Care - HMO | Source: Ambulatory Visit | Attending: Radiation Oncology | Admitting: Radiation Oncology

## 2015-01-01 ENCOUNTER — Ambulatory Visit
Admission: RE | Admit: 2015-01-01 | Payer: Commercial Managed Care - HMO | Source: Ambulatory Visit | Admitting: Radiation Oncology

## 2015-01-01 DIAGNOSIS — C3490 Malignant neoplasm of unspecified part of unspecified bronchus or lung: Secondary | ICD-10-CM | POA: Insufficient documentation

## 2015-01-01 DIAGNOSIS — C3492 Malignant neoplasm of unspecified part of left bronchus or lung: Secondary | ICD-10-CM

## 2015-01-01 DIAGNOSIS — Z510 Encounter for antineoplastic radiation therapy: Secondary | ICD-10-CM | POA: Insufficient documentation

## 2015-01-01 NOTE — Progress Notes (Deleted)
[facility] Simulation and Treatment Planning Note   Name: [name] MRN: [id number]  Date: 01/01/2015  DOB: 03-09-1956  Status: outpatient    DIAGNOSIS: Non-small cell carcinoma of lung   Staging form: Lung, AJCC 7th Edition     Clinical stage from 12/04/2014: Stage IIA (T2b, N0, M0) - Signed by Curt Bears, MD on 12/04/2014       Staging comments: Adenocarcinoma   CONSENT VERIFIED: yes   SET UP AND IMMOBILIZATION: Patient is setup supine with arms in a wing board.   NARRATIVE: The patient was brought to the [place].  Identity was confirmed.  All relevant records and images related to the planned course of therapy were reviewed.  Then, the patient was positioned in a stable reproducible clinical set-up for radiation therapy.  CT images were obtained.  Skin markings were placed.  The CT images were loaded into the planning software where the target and avoidance structures were contoured.  The radiation prescription was entered and confirmed.   TREATMENT PLANNING NOTE:  Treatment planning then occurred. I have requested 3D simulation with [facility] of the spinal cord, total lungs and gross tumor volume. I have also requested mlcs and an isodose plan.

## 2015-01-02 ENCOUNTER — Telehealth: Payer: Self-pay | Admitting: Licensed Clinical Social Worker

## 2015-01-02 ENCOUNTER — Telehealth: Payer: Self-pay | Admitting: *Deleted

## 2015-01-02 NOTE — Telephone Encounter (Signed)
During [facility] call [name] discussed needed assistance with transportation.  Pt's EMR reflect pt as having [employer].  CSW placed call to Ms. Tilly.  CSW left message requesting return call. Provided information for [facility] transportation/[employer] [phone] and briefly discussed SCAT.  CSW provided contact hours and phone number.

## 2015-01-02 NOTE — Telephone Encounter (Signed)
Transition Care Management Follow-up Telephone Call   Date discharged?09/27   How have you been since you were released from the hospital? "about the same, no one cares or listens"   Do you understand why you were in the hospital? yes   Do you understand the discharge instructions? yes   Where were you discharged to? home   Items Reviewed:  Medications reviewed: yes  Allergies reviewed: yes  Dietary changes reviewed: yes, no changes but not hungry, have not eaten in 2 days, had diarrhea for 2 days  Referrals reviewed: yes   Functional Questionnaire:   Activities of Daily Living (ADLs):   She states they are independent in the following: does per self but has help available States they require assistance with the following: does self   Any transportation issues/concerns?: yes, cancer ctr supposed to be working on transportation but would like [facility]c csw to address   Any patient concerns? yes, my legs hurting all the time, my side hurting, nobody listens   Confirmed importance and date/time of follow-up visits scheduled yes  Provider Appointment booked with dr Eppie Gibson 10/14- she was in the ED 0n 9/27  Confirmed with patient if condition begins to worsen call PCP or go to the ER.  Patient was given the office number and encouraged to call back with question or concerns.  : yes

## 2015-01-02 NOTE — Telephone Encounter (Signed)
Thank you.  Please see CSW telephone note 01/02/15

## 2015-01-05 DIAGNOSIS — J9600 Acute respiratory failure, unspecified whether with hypoxia or hypercapnia: Secondary | ICD-10-CM | POA: Diagnosis not present

## 2015-01-05 DIAGNOSIS — J449 Chronic obstructive pulmonary disease, unspecified: Secondary | ICD-10-CM | POA: Diagnosis not present

## 2015-01-05 DIAGNOSIS — R0902 Hypoxemia: Secondary | ICD-10-CM | POA: Diagnosis not present

## 2015-01-05 NOTE — Telephone Encounter (Signed)
CSW placed called to pt.  CSW left message requesting return call. CSW provided contact hours and phone number.

## 2015-01-07 ENCOUNTER — Ambulatory Visit
Admission: RE | Admit: 2015-01-07 | Discharge: 2015-01-07 | Disposition: A | Discharge status: Home / Self Care | Payer: Commercial Managed Care - HMO | Source: Ambulatory Visit | Attending: Radiation Oncology | Admitting: Radiation Oncology

## 2015-01-07 VITALS — BP 130/75 | HR 78 | Temp 98.6°F

## 2015-01-07 DIAGNOSIS — C3492 Malignant neoplasm of unspecified part of left bronchus or lung: Secondary | ICD-10-CM | POA: Diagnosis not present

## 2015-01-07 DIAGNOSIS — C3490 Malignant neoplasm of unspecified part of unspecified bronchus or lung: Secondary | ICD-10-CM | POA: Diagnosis not present

## 2015-01-07 DIAGNOSIS — Z510 Encounter for antineoplastic radiation therapy: Secondary | ICD-10-CM | POA: Diagnosis not present

## 2015-01-07 NOTE — Progress Notes (Signed)
[facility] Simulation and Treatment Planning Note   Name: [name] MRN: [id number]  Date: 01/07/2015  DOB: 05-May-1955  Status: outpatient    DIAGNOSIS: Non-small cell carcinoma of lung ([place])   Staging form: Lung, AJCC 7th Edition     Clinical stage from 12/04/2014: Stage IIA (T2b, N0, M0) - Signed by Curt Bears, MD on 12/04/2014       Staging comments: Adenocarcinoma      CONSENT VERIFIED: yes   SET UP AND IMMOBILIZATION: Patient is setup supine with arms in a wing board.   NARRATIVE: The patient was brought to the [place].  Identity was confirmed.  All relevant records and images related to the planned course of therapy were reviewed.  Then, the patient was positioned in a stable reproducible clinical set-up for radiation therapy.  CT images were obtained.  Skin markings were placed.  The CT images were loaded into the planning software where the target and avoidance structures were contoured.  The radiation prescription was entered and confirmed.   TREATMENT PLANNING NOTE:  Treatment planning then occurred. I have requested 3D simulation with [facility] of the spinal cord, total lungs and gross tumor volume. I have also requested mlcs and an isodose plan.

## 2015-01-07 NOTE — Addendum Note (Signed)
Encounter addended by: Thea Silversmith, MD on: 01/07/2015  1:35 PM<BR>     Documentation filed: Follow-up Section, LOS Section, Notes Section

## 2015-01-07 NOTE — Addendum Note (Signed)
Encounter addended by: Thea Silversmith, MD on: 01/07/2015  1:36 PM<BR>     Documentation filed: Notes Section

## 2015-01-08 NOTE — Telephone Encounter (Signed)
No response back from [name]  CSW mailed letter with transportation resources.

## 2015-01-08 NOTE — Addendum Note (Signed)
Encounter addended by: Norm Salt, RN on: 01/08/2015  9:17 AM<BR>     Documentation filed: Charges VN

## 2015-01-11 ENCOUNTER — Other Ambulatory Visit: Payer: Self-pay | Admitting: Internal Medicine

## 2015-01-11 DIAGNOSIS — E876 Hypokalemia: Secondary | ICD-10-CM

## 2015-01-11 DIAGNOSIS — T502X5A Adverse effect of carbonic-anhydrase inhibitors, benzothiadiazides and other diuretics, initial encounter: Principal | ICD-10-CM

## 2015-01-13 DIAGNOSIS — C3490 Malignant neoplasm of unspecified part of unspecified bronchus or lung: Secondary | ICD-10-CM | POA: Diagnosis not present

## 2015-01-13 DIAGNOSIS — C3492 Malignant neoplasm of unspecified part of left bronchus or lung: Secondary | ICD-10-CM | POA: Diagnosis not present

## 2015-01-13 DIAGNOSIS — Z510 Encounter for antineoplastic radiation therapy: Secondary | ICD-10-CM | POA: Diagnosis not present

## 2015-01-14 ENCOUNTER — Ambulatory Visit
Admission: RE | Admit: 2015-01-14 | Discharge: 2015-01-14 | Disposition: A | Discharge status: Home / Self Care | Payer: Commercial Managed Care - HMO | Source: Ambulatory Visit | Attending: Radiation Oncology | Admitting: Radiation Oncology

## 2015-01-14 DIAGNOSIS — C3492 Malignant neoplasm of unspecified part of left bronchus or lung: Secondary | ICD-10-CM | POA: Diagnosis not present

## 2015-01-14 DIAGNOSIS — Z510 Encounter for antineoplastic radiation therapy: Secondary | ICD-10-CM | POA: Diagnosis not present

## 2015-01-14 DIAGNOSIS — C3490 Malignant neoplasm of unspecified part of unspecified bronchus or lung: Secondary | ICD-10-CM | POA: Diagnosis not present

## 2015-01-15 ENCOUNTER — Telehealth: Payer: Self-pay

## 2015-01-15 ENCOUNTER — Ambulatory Visit
Admission: RE | Admit: 2015-01-15 | Discharge: 2015-01-15 | Disposition: A | Discharge status: Home / Self Care | Payer: Commercial Managed Care - HMO | Source: Ambulatory Visit | Attending: Radiation Oncology | Admitting: Radiation Oncology

## 2015-01-15 DIAGNOSIS — C3490 Malignant neoplasm of unspecified part of unspecified bronchus or lung: Secondary | ICD-10-CM | POA: Diagnosis not present

## 2015-01-15 DIAGNOSIS — Z510 Encounter for antineoplastic radiation therapy: Secondary | ICD-10-CM | POA: Diagnosis not present

## 2015-01-15 DIAGNOSIS — C3492 Malignant neoplasm of unspecified part of left bronchus or lung: Secondary | ICD-10-CM | POA: Diagnosis not present

## 2015-01-15 NOTE — Telephone Encounter (Signed)
Received call from Idamae Lusher, Case manager with [facility].  Patient has an appointment with [place] tomorrow 01/16/15. Idamae Lusher wanted to let us know that patient reports she has been taking Aleve and Tylenol for pain and it is not helping her. Also, patient reports to [place] that she is feeling down regarding her health.   FYI

## 2015-01-16 ENCOUNTER — Ambulatory Visit (INDEPENDENT_AMBULATORY_CARE_PROVIDER_SITE_OTHER): Payer: Commercial Managed Care - HMO | Admitting: Internal Medicine

## 2015-01-16 ENCOUNTER — Encounter: Payer: Self-pay | Admitting: Internal Medicine

## 2015-01-16 ENCOUNTER — Ambulatory Visit
Admission: RE | Admit: 2015-01-16 | Discharge: 2015-01-16 | Disposition: A | Discharge status: Home / Self Care | Payer: Commercial Managed Care - HMO | Source: Ambulatory Visit | Attending: Radiation Oncology | Admitting: Radiation Oncology

## 2015-01-16 VITALS — BP 151/77 | HR 92 | Temp 98.9°F | Wt 253.9 lb

## 2015-01-16 DIAGNOSIS — J449 Chronic obstructive pulmonary disease, unspecified: Secondary | ICD-10-CM

## 2015-01-16 DIAGNOSIS — C3492 Malignant neoplasm of unspecified part of left bronchus or lung: Secondary | ICD-10-CM | POA: Diagnosis not present

## 2015-01-16 DIAGNOSIS — E538 Deficiency of other specified B group vitamins: Secondary | ICD-10-CM

## 2015-01-16 DIAGNOSIS — I5032 Chronic diastolic (congestive) heart failure: Secondary | ICD-10-CM

## 2015-01-16 DIAGNOSIS — F329 Major depressive disorder, single episode, unspecified: Secondary | ICD-10-CM | POA: Diagnosis not present

## 2015-01-16 DIAGNOSIS — F322 Major depressive disorder, single episode, severe without psychotic features: Secondary | ICD-10-CM

## 2015-01-16 DIAGNOSIS — K219 Gastro-esophageal reflux disease without esophagitis: Secondary | ICD-10-CM

## 2015-01-16 DIAGNOSIS — D692 Other nonthrombocytopenic purpura: Secondary | ICD-10-CM

## 2015-01-16 DIAGNOSIS — Z23 Encounter for immunization: Secondary | ICD-10-CM | POA: Diagnosis not present

## 2015-01-16 DIAGNOSIS — Z7951 Long term (current) use of inhaled steroids: Secondary | ICD-10-CM

## 2015-01-16 DIAGNOSIS — G63 Polyneuropathy in diseases classified elsewhere: Secondary | ICD-10-CM | POA: Diagnosis not present

## 2015-01-16 DIAGNOSIS — E876 Hypokalemia: Secondary | ICD-10-CM

## 2015-01-16 DIAGNOSIS — C3490 Malignant neoplasm of unspecified part of unspecified bronchus or lung: Secondary | ICD-10-CM | POA: Diagnosis not present

## 2015-01-16 DIAGNOSIS — Z9981 Dependence on supplemental oxygen: Secondary | ICD-10-CM

## 2015-01-16 DIAGNOSIS — I2510 Atherosclerotic heart disease of native coronary artery without angina pectoris: Secondary | ICD-10-CM

## 2015-01-16 DIAGNOSIS — Z0000 Encounter for general adult medical examination without abnormal findings: Secondary | ICD-10-CM

## 2015-01-16 DIAGNOSIS — Z510 Encounter for antineoplastic radiation therapy: Secondary | ICD-10-CM | POA: Diagnosis not present

## 2015-01-16 DIAGNOSIS — T502X5A Adverse effect of carbonic-anhydrase inhibitors, benzothiadiazides and other diuretics, initial encounter: Secondary | ICD-10-CM

## 2015-01-16 MED ORDER — CYANOCOBALAMIN 1000 MCG/ML IJ SOLN
1000.0000 ug | Freq: Once | INTRAMUSCULAR | Status: AC
  Administered 2015-01-16: 1000 ug via INTRAMUSCULAR

## 2015-01-16 MED ORDER — GABAPENTIN 300 MG PO CAPS: 300.0000 mg | ORAL_CAPSULE | Freq: Three times a day (TID) | ORAL | Status: DC

## 2015-01-16 MED ORDER — CYANOCOBALAMIN 1000 MCG/ML IJ SOLN: 1000.0000 ug | Freq: Once | INTRAMUSCULAR | Status: DC

## 2015-01-16 MED ORDER — ASPIRIN 81 MG PO CHEW: 81.0000 mg | CHEWABLE_TABLET | Freq: Every day | ORAL | Status: AC

## 2015-01-16 MED ORDER — FUROSEMIDE 40 MG PO TABS: 40.0000 mg | ORAL_TABLET | Freq: Every day | ORAL | Status: DC | PRN

## 2015-01-16 MED ORDER — POTASSIUM CHLORIDE CRYS ER 20 MEQ PO TBCR: 40.0000 meq | EXTENDED_RELEASE_TABLET | Freq: Every day | ORAL | Status: DC | PRN

## 2015-01-16 NOTE — Assessment & Plan Note (Signed)
Her breathing is stable on the Advair 250-50 one puff twice daily, tiotropium 18 g daily, and as needed albuterol either via a metered-dose inhaler or nebulization. She is also on supplemental home oxygen therapy. We will continue with these therapies and reassess her pulmonary symptoms at the follow-up visit.

## 2015-01-16 NOTE — Assessment & Plan Note (Signed)
Review of [name] symptoms in the lower extremities suggest she may be suffering from neuropathic pain. 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 Unfortunately, these are usually nonreversible. That being said, she was given a vitamin B12 shot today to follow-up on the one that she had received last month. She will require continued IM B12 supplementation until her stores have been repleted. With regards to her neuropathic pain we will start gabapentin 300 mg by mouth at night and titrate up by 300 mg daily every week until she reaches 300 mg 3 times daily. We will maintain that level until she is seen again in clinic where further adjustments will be made if necessary. I fully expect this to be the case. She was reminded of the side effects associated with gabapentin, specifically somnolence. She was asked not to quit on the medication unless she had a side effect as we would need to be titrating up the medication over the next several weeks to months. Between the vitamin B12 supplementation and the gabapentin escalation I am hopeful we can get control of her neuropathic pain. 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 We will reassess her symptoms at the follow-up visit.

## 2015-01-16 NOTE — Assessment & Plan Note (Signed)
Since her diagnosis of lung cancer she has been depressed. Her PHQ score today was 5. We had a long discussion about depression and the expectations for such symptoms given her recent diagnosis. We discussed pharmacologic therapy, and at this point she preferred to give it some time. We will reassess her depression at the follow-up visit and institute pharmacotherapy if necessary and desired.

## 2015-01-16 NOTE — Assessment & Plan Note (Signed)
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 This is asymptomatic at this time. We will continue to follow it and reassess at the return visit.

## 2015-01-16 NOTE — Progress Notes (Signed)
   Subjective:    Patient ID: [name], female    DOB: 04/09/1955, 59 y.o.   MRN: [id number]  HPI  [name] is here for follow-up of her COPD and lung cancer. Please see the A&P for the status of the pt's chronic medical problems.  She was recently assigned to my panel and this is the first time we are meeting. I asked her if there were any issues that she wanted me to address and she specifically stated her bilateral lower extremity leg pain. She describes this pain as a shooting pain that radiates from the knees down to the toes. It is stocking in distribution and has been present for several months. The pain at times can be excruciating. She alternates Tylenol with Naprosyn and has some relief, but not significant. She also mentions that she has been somewhat depressed since her diagnosis of lung cancer. We had a long discussion about options for therapy and she decided to defer pharmacotherapy at this time. We are hopeful that with continued treatment of her lung cancer and with time from diagnosis, her depression may improve. She was satisfied with this approach.  Review of Systems  Respiratory: Positive for shortness of breath.   Cardiovascular: Positive for leg swelling. Negative for chest pain.  Musculoskeletal: Positive for myalgias and arthralgias.  Neurological: Positive for numbness.  Psychiatric/Behavioral: Positive for dysphoric mood.      Objective:   Physical Exam  Constitutional: She is oriented to person, place, and time. She appears well-developed and well-nourished. No distress.  HENT:  Head: Normocephalic and atraumatic.  Eyes: Conjunctivae are normal. Right eye exhibits no discharge. Left eye exhibits no discharge. No scleral icterus.  Musculoskeletal: Normal range of motion. She exhibits edema. She exhibits no tenderness.  Neurological: She is alert and oriented to person, place, and time. She exhibits normal muscle tone.  Skin: She is not diaphoretic.    Psychiatric: She has a normal mood and affect. Her behavior is normal. Judgment and thought content normal.  Nursing note and vitals reviewed.     Assessment & Plan:   Please see problem oriented charting.

## 2015-01-16 NOTE — Assessment & Plan Note (Signed)
She is still having occasional symptoms of gastroesophageal reflux disease despite the pantoprazole and prior Nissen fundoplication. I told her that it was safe to take as needed Tums. We will reassess her symptoms of reflux while on the pantoprazole and as needed Tums at the follow-up visit.

## 2015-01-16 NOTE — Assessment & Plan Note (Signed)
She is not having any symptoms of decompensated chronic diastolic heart failure. She has been taking her Lasix on an as-needed basis rather than daily. As she is asymptomatic from a cardiorespiratory standpoint on the as needed Lasix I told her she could continue with this dosing schedule. She is aware that every time she takes a Lasix tablet she also needs to take her potassium. We will reassess for decompensation of her chronic diastolic heart failure at the follow-up visit.

## 2015-01-16 NOTE — Assessment & Plan Note (Signed)
She was recently diagnosed with stage IIA (T2b, N0, M0) adenocarcinoma of the left lung. She is not a surgical candidate given her severe chronic obstructive pulmonary disease. She is therefore being treated with radiation therapy. She states she started her first dose yesterday and is told they plan on 6 weeks of radiation. As she is being followed closely by the [facility] I will defer further management of her cancer to them. We will reassess her progress at the follow-up visit.

## 2015-01-16 NOTE — Patient Instructions (Signed)
It was nice to meet you.  I look forward to taking care of you for years to come.  1) I started gabapentin for your lower leg pain which I think is from some nerve damage.  Start with 300 mg (1 tablet) each night for 1 week, then increase to 300 mg (1 tablet) twice daily for 1 week, then increase to 300 mg (1 tablet) three times a day thereafter.  We will likely increase the dose when I see you again.  2) You can take the lasix and the potassium as needed.  3) You can take as needed TUMS for your heartburn.  Keep taking the protonix.  4) Keep taking the other medications as you are.  5) We gave you a vitamin B12 shot today.  We will give you another shot when you come back to clinic.  6) We gave you the flu shot today.  I will see you back in 3 months at the last clinic slot of the morning so we may have more reviewing your history.

## 2015-01-19 ENCOUNTER — Ambulatory Visit
Admission: RE | Admit: 2015-01-19 | Discharge: 2015-01-19 | Disposition: A | Discharge status: Home / Self Care | Payer: Commercial Managed Care - HMO | Source: Ambulatory Visit | Attending: Radiation Oncology | Admitting: Radiation Oncology

## 2015-01-19 DIAGNOSIS — C3490 Malignant neoplasm of unspecified part of unspecified bronchus or lung: Secondary | ICD-10-CM | POA: Diagnosis not present

## 2015-01-19 DIAGNOSIS — Z510 Encounter for antineoplastic radiation therapy: Secondary | ICD-10-CM | POA: Diagnosis not present

## 2015-01-19 DIAGNOSIS — C3492 Malignant neoplasm of unspecified part of left bronchus or lung: Secondary | ICD-10-CM | POA: Diagnosis not present

## 2015-01-20 ENCOUNTER — Ambulatory Visit: Payer: Commercial Managed Care - HMO | Admitting: Radiation Oncology

## 2015-01-20 ENCOUNTER — Ambulatory Visit
Admission: RE | Admit: 2015-01-20 | Discharge: 2015-01-20 | Disposition: A | Discharge status: Home / Self Care | Payer: Commercial Managed Care - HMO | Source: Ambulatory Visit | Attending: Radiation Oncology | Admitting: Radiation Oncology

## 2015-01-20 ENCOUNTER — Ambulatory Visit: Payer: Commercial Managed Care - HMO

## 2015-01-21 ENCOUNTER — Ambulatory Visit
Admission: RE | Admit: 2015-01-21 | Discharge: 2015-01-21 | Disposition: A | Discharge status: Home / Self Care | Payer: Commercial Managed Care - HMO | Source: Ambulatory Visit | Attending: Radiation Oncology | Admitting: Radiation Oncology

## 2015-01-21 DIAGNOSIS — C3490 Malignant neoplasm of unspecified part of unspecified bronchus or lung: Secondary | ICD-10-CM | POA: Diagnosis not present

## 2015-01-21 DIAGNOSIS — C3492 Malignant neoplasm of unspecified part of left bronchus or lung: Secondary | ICD-10-CM

## 2015-01-21 DIAGNOSIS — Z510 Encounter for antineoplastic radiation therapy: Secondary | ICD-10-CM | POA: Diagnosis not present

## 2015-01-21 MED ORDER — ALRA NON-METALLIC DEODORANT (RAD-ONC): 1.0000 "application " | Freq: Once | TOPICAL | Status: DC

## 2015-01-21 MED ORDER — RADIAPLEXRX EX GEL: Freq: Once | CUTANEOUS | Status: DC

## 2015-01-21 NOTE — Progress Notes (Signed)
Weekly Management Note Current Dose:10.8 Gy  Projected Dose:70.2 Gy   Narrative:  The patient presents for routine under treatment assessment.  CBCT/MVCT images/Port film x-rays were reviewed.  The chart was checked. Doing well. Fatigued. Difficulty with transportation.   Physical Findings:  Unchanged  Vitals: There were no vitals filed for this visit. Weight:  Wt Readings from Last 3 Encounters:  01/16/15 253 lb 14.4 oz (115.168 kg)  12/30/14 250 lb (113.399 kg)  12/22/14 264 lb 15.9 oz (120.2 kg)   Lab Results  Component Value Date   WBC 11.6* 12/30/2014   HGB 11.5* 12/30/2014   HCT 36.8 12/30/2014   MCV 105.7* 12/30/2014   PLT 205 12/30/2014   Lab Results  Component Value Date   CREATININE 0.70 12/30/2014   BUN <5* 12/30/2014   NA 139 12/30/2014   K 3.9 12/30/2014   CL 98* 12/30/2014   CO2 35* 12/30/2014     Impression:  The patient is tolerating radiation.  Plan:  Continue treatment as planned. Refer to SW.

## 2015-01-21 NOTE — Progress Notes (Signed)
Routine of clinic reviewed.Given radiaplex to apply to affected area of chest posterior and anterior when changes develop.Reviewed possible side effects to include fatigue, irritation of esophagus, cough, increase in shortness of breath and skin changes.Given Radiation Therapy and You Booklet.

## 2015-01-22 ENCOUNTER — Ambulatory Visit
Admission: RE | Admit: 2015-01-22 | Discharge: 2015-01-22 | Disposition: A | Discharge status: Home / Self Care | Payer: Commercial Managed Care - HMO | Source: Ambulatory Visit | Attending: Radiation Oncology | Admitting: Radiation Oncology

## 2015-01-22 DIAGNOSIS — C3490 Malignant neoplasm of unspecified part of unspecified bronchus or lung: Secondary | ICD-10-CM | POA: Diagnosis not present

## 2015-01-22 DIAGNOSIS — C3492 Malignant neoplasm of unspecified part of left bronchus or lung: Secondary | ICD-10-CM | POA: Diagnosis not present

## 2015-01-22 DIAGNOSIS — Z510 Encounter for antineoplastic radiation therapy: Secondary | ICD-10-CM | POA: Diagnosis not present

## 2015-01-23 ENCOUNTER — Ambulatory Visit
Admission: RE | Admit: 2015-01-23 | Discharge: 2015-01-23 | Disposition: A | Discharge status: Home / Self Care | Payer: Commercial Managed Care - HMO | Source: Ambulatory Visit | Attending: Radiation Oncology | Admitting: Radiation Oncology

## 2015-01-23 DIAGNOSIS — C3492 Malignant neoplasm of unspecified part of left bronchus or lung: Secondary | ICD-10-CM | POA: Diagnosis not present

## 2015-01-23 DIAGNOSIS — C3490 Malignant neoplasm of unspecified part of unspecified bronchus or lung: Secondary | ICD-10-CM | POA: Diagnosis not present

## 2015-01-23 DIAGNOSIS — Z510 Encounter for antineoplastic radiation therapy: Secondary | ICD-10-CM | POA: Diagnosis not present

## 2015-01-26 ENCOUNTER — Ambulatory Visit
Admission: RE | Admit: 2015-01-26 | Discharge: 2015-01-26 | Disposition: A | Discharge status: Home / Self Care | Payer: Commercial Managed Care - HMO | Source: Ambulatory Visit | Attending: Radiation Oncology | Admitting: Radiation Oncology

## 2015-01-26 DIAGNOSIS — C3492 Malignant neoplasm of unspecified part of left bronchus or lung: Secondary | ICD-10-CM | POA: Diagnosis not present

## 2015-01-26 DIAGNOSIS — C3490 Malignant neoplasm of unspecified part of unspecified bronchus or lung: Secondary | ICD-10-CM | POA: Diagnosis not present

## 2015-01-26 DIAGNOSIS — Z510 Encounter for antineoplastic radiation therapy: Secondary | ICD-10-CM | POA: Diagnosis not present

## 2015-01-27 ENCOUNTER — Emergency Department (HOSPITAL_COMMUNITY): Payer: Commercial Managed Care - HMO

## 2015-01-27 ENCOUNTER — Ambulatory Visit
Admission: RE | Admit: 2015-01-27 | Discharge: 2015-01-27 | Disposition: A | Discharge status: Home / Self Care | Payer: Commercial Managed Care - HMO | Source: Ambulatory Visit | Attending: Radiation Oncology | Admitting: Radiation Oncology

## 2015-01-27 ENCOUNTER — Encounter (HOSPITAL_COMMUNITY): Payer: Self-pay | Admitting: Emergency Medicine

## 2015-01-27 ENCOUNTER — Encounter: Payer: Self-pay | Admitting: Radiation Oncology

## 2015-01-27 ENCOUNTER — Encounter: Payer: Self-pay | Admitting: *Deleted

## 2015-01-27 ENCOUNTER — Other Ambulatory Visit: Payer: Self-pay

## 2015-01-27 ENCOUNTER — Emergency Department (HOSPITAL_COMMUNITY)
Admission: EM | Admit: 2015-01-27 | Discharge: 2015-01-28 | Disposition: A | Discharge status: Home / Self Care | Payer: Commercial Managed Care - HMO | Attending: Emergency Medicine | Admitting: Emergency Medicine

## 2015-01-27 VITALS — BP 100/74 | HR 102 | Temp 99.0°F | Ht 65.0 in | Wt 251.4 lb

## 2015-01-27 DIAGNOSIS — C3492 Malignant neoplasm of unspecified part of left bronchus or lung: Secondary | ICD-10-CM

## 2015-01-27 DIAGNOSIS — E669 Obesity, unspecified: Secondary | ICD-10-CM | POA: Diagnosis not present

## 2015-01-27 DIAGNOSIS — R0602 Shortness of breath: Secondary | ICD-10-CM | POA: Diagnosis not present

## 2015-01-27 DIAGNOSIS — J40 Bronchitis, not specified as acute or chronic: Secondary | ICD-10-CM | POA: Diagnosis not present

## 2015-01-27 DIAGNOSIS — Z85118 Personal history of other malignant neoplasm of bronchus and lung: Secondary | ICD-10-CM | POA: Insufficient documentation

## 2015-01-27 DIAGNOSIS — F17210 Nicotine dependence, cigarettes, uncomplicated: Secondary | ICD-10-CM | POA: Diagnosis not present

## 2015-01-27 DIAGNOSIS — M1990 Unspecified osteoarthritis, unspecified site: Secondary | ICD-10-CM | POA: Insufficient documentation

## 2015-01-27 DIAGNOSIS — Z9889 Other specified postprocedural states: Secondary | ICD-10-CM | POA: Insufficient documentation

## 2015-01-27 DIAGNOSIS — F329 Major depressive disorder, single episode, unspecified: Secondary | ICD-10-CM | POA: Diagnosis not present

## 2015-01-27 DIAGNOSIS — E785 Hyperlipidemia, unspecified: Secondary | ICD-10-CM | POA: Insufficient documentation

## 2015-01-27 DIAGNOSIS — R05 Cough: Secondary | ICD-10-CM | POA: Diagnosis not present

## 2015-01-27 DIAGNOSIS — Z8614 Personal history of Methicillin resistant Staphylococcus aureus infection: Secondary | ICD-10-CM | POA: Insufficient documentation

## 2015-01-27 DIAGNOSIS — Z510 Encounter for antineoplastic radiation therapy: Secondary | ICD-10-CM | POA: Diagnosis not present

## 2015-01-27 DIAGNOSIS — J441 Chronic obstructive pulmonary disease with (acute) exacerbation: Secondary | ICD-10-CM | POA: Diagnosis not present

## 2015-01-27 DIAGNOSIS — Z7982 Long term (current) use of aspirin: Secondary | ICD-10-CM | POA: Diagnosis not present

## 2015-01-27 DIAGNOSIS — I2510 Atherosclerotic heart disease of native coronary artery without angina pectoris: Secondary | ICD-10-CM | POA: Diagnosis not present

## 2015-01-27 DIAGNOSIS — Z79899 Other long term (current) drug therapy: Secondary | ICD-10-CM | POA: Diagnosis not present

## 2015-01-27 DIAGNOSIS — Z7951 Long term (current) use of inhaled steroids: Secondary | ICD-10-CM | POA: Diagnosis not present

## 2015-01-27 DIAGNOSIS — Z8701 Personal history of pneumonia (recurrent): Secondary | ICD-10-CM | POA: Insufficient documentation

## 2015-01-27 DIAGNOSIS — R0902 Hypoxemia: Secondary | ICD-10-CM | POA: Diagnosis not present

## 2015-01-27 DIAGNOSIS — Z9861 Coronary angioplasty status: Secondary | ICD-10-CM | POA: Diagnosis not present

## 2015-01-27 DIAGNOSIS — C3490 Malignant neoplasm of unspecified part of unspecified bronchus or lung: Secondary | ICD-10-CM | POA: Diagnosis not present

## 2015-01-27 DIAGNOSIS — I509 Heart failure, unspecified: Secondary | ICD-10-CM | POA: Insufficient documentation

## 2015-01-27 LAB — CBC WITH DIFFERENTIAL/PLATELET
BASOS ABS: 0 10*3/uL (ref 0.0–0.1)
Basophils Relative: 0 %
EOS ABS: 0.1 10*3/uL (ref 0.0–0.7)
Eosinophils Relative: 1 %
HCT: 40.2 % (ref 36.0–46.0)
Hemoglobin: 12.5 g/dL (ref 12.0–15.0)
Lymphocytes Relative: 6 %
Lymphs Abs: 0.5 10*3/uL — ABNORMAL LOW (ref 0.7–4.0)
MCH: 33.2 pg (ref 26.0–34.0)
MCHC: 31.1 g/dL (ref 30.0–36.0)
MCV: 106.6 fL — ABNORMAL HIGH (ref 78.0–100.0)
MONO ABS: 1.1 10*3/uL — AB (ref 0.1–1.0)
MONOS PCT: 12 %
NEUTROS ABS: 7.6 10*3/uL (ref 1.7–7.7)
NEUTROS PCT: 81 %
Platelets: 267 10*3/uL (ref 150–400)
RBC: 3.77 MIL/uL — ABNORMAL LOW (ref 3.87–5.11)
RDW: 14.2 % (ref 11.5–15.5)
WBC: 9.4 10*3/uL (ref 4.0–10.5)

## 2015-01-27 LAB — BASIC METABOLIC PANEL
ANION GAP: 7 (ref 5–15)
BUN: 5 mg/dL — ABNORMAL LOW (ref 6–20)
CALCIUM: 8.8 mg/dL — AB (ref 8.9–10.3)
CO2: 43 mmol/L — ABNORMAL HIGH (ref 22–32)
CREATININE: 0.7 mg/dL (ref 0.44–1.00)
Chloride: 87 mmol/L — ABNORMAL LOW (ref 101–111)
Glucose, Bld: 149 mg/dL — ABNORMAL HIGH (ref 65–99)
Potassium: 3.9 mmol/L (ref 3.5–5.1)
SODIUM: 137 mmol/L (ref 135–145)

## 2015-01-27 LAB — I-STAT TROPONIN, ED: TROPONIN I, POC: 0 ng/mL (ref 0.00–0.08)

## 2015-01-27 LAB — BRAIN NATRIURETIC PEPTIDE: B Natriuretic Peptide: 86.6 pg/mL (ref 0.0–100.0)

## 2015-01-27 LAB — I-STAT CG4 LACTIC ACID, ED: LACTIC ACID, VENOUS: 2.11 mmol/L — AB (ref 0.5–2.0)

## 2015-01-27 MED ORDER — IPRATROPIUM-ALBUTEROL 0.5-2.5 (3) MG/3ML IN SOLN
3.0000 mL | Freq: Once | RESPIRATORY_TRACT | Status: AC
  Administered 2015-01-27: 3 mL via RESPIRATORY_TRACT
  Filled 2015-01-27: qty 3

## 2015-01-27 MED ORDER — PREDNISONE 20 MG PO TABS: 40.0000 mg | ORAL_TABLET | Freq: Every day | ORAL | Status: DC

## 2015-01-27 MED ORDER — SODIUM CHLORIDE 0.9 % IV BOLUS (SEPSIS)
500.0000 mL | Freq: Once | INTRAVENOUS | Status: AC
  Administered 2015-01-27: 500 mL via INTRAVENOUS

## 2015-01-27 MED ORDER — PREDNISONE 20 MG PO TABS
60.0000 mg | ORAL_TABLET | Freq: Once | ORAL | Status: AC
  Administered 2015-01-27: 60 mg via ORAL
  Filled 2015-01-27: qty 3

## 2015-01-27 MED ORDER — DOXYCYCLINE HYCLATE 100 MG PO CAPS: 100.0000 mg | ORAL_CAPSULE | Freq: Two times a day (BID) | ORAL | Status: DC

## 2015-01-27 NOTE — Progress Notes (Signed)
Weekly Management Note Current Dose:21.6 Gy  Projected Dose: 70.2 Gy   Narrative:  The patient presents for routine under treatment assessment.  CBCT/MVCT images/Port film x-rays were reviewed.  The chart was checked. Pt feeling bad. "I think I need to go to the ER."  Ran out of O2 on way to treatment today. O2 sat in nursing was 69%. Productive cough with temperature elevated at home.   Physical Findings: Pale. Increased respiratory effort.   Vitals:  Filed Vitals:   01/27/15 1731  BP: 100/74  Pulse: 102  Temp: 99 F (37.2 C)   Weight:  Wt Readings from Last 3 Encounters:  01/27/15 251 lb 6.4 oz (114.034 kg)  01/16/15 253 lb 14.4 oz (115.168 kg)  12/30/14 250 lb (113.399 kg)   Lab Results  Component Value Date   WBC 11.6* 12/30/2014   HGB 11.5* 12/30/2014   HCT 36.8 12/30/2014   MCV 105.7* 12/30/2014   PLT 205 12/30/2014   Lab Results  Component Value Date   CREATININE 0.70 12/30/2014   BUN <5* 12/30/2014   NA 139 12/30/2014   K 3.9 12/30/2014   CL 98* 12/30/2014   CO2 35* 12/30/2014     Impression:  The patient is tolerating radiation.  Plan:  Continue treatment as planned. To ER.  Both for oxygen and possible admission.

## 2015-01-27 NOTE — ED Notes (Signed)
Provider notified of abnormal lactic acid.

## 2015-01-27 NOTE — Progress Notes (Addendum)
[facility] received consult for oxygen for transport home for patient.  EDCM spoke to patient at bedside.  Patient reports she has oxygen at home, she doesn't have oxygen to go home on.  Patient's oxygen tank was empty upon arrival to cancer center today.  EDCM suggested to take ambulance home so that the patient will have oxygen.  Patient reports she does not want to take ambulance home because insurance will not pay for it.  Patient reports she gets her oxygen delivered by [employer].  EDCM placing call to [employer] [phone].  Discussed with EDP and EDPA.  01/27/2015 A. Sheehan Stacey 2144pm Charge RN spoke to patient at bedside, patient confirmed she has oxygen at home and oxygen concentrator at home.  Charge RN called emergency contacts listed in patient's chart to assist with transport home. EDCM on hold with [employer] for approximately 1/2 hour without success, no one picked up..  No further EDCM needs at home.  01/27/2015 A.Seher Schlagel RNCM [id number] EDCM went to speak to patient at bedside to ask for permission to email her social worker Lauren at the cancer center regarding financial difficulty/difficulty paying bills.  Patient reports if we can get in touch with her friend Truman Hayward she can get a ride home.

## 2015-01-27 NOTE — ED Notes (Signed)
Bed: WA06 Expected date:  Expected time:  Means of arrival:  Comments: Cancer center

## 2015-01-27 NOTE — Discharge Instructions (Signed)
Continue your breathing treatments at home. Take prednisone as prescribe until all gone. Doxycycline as prescribed for possible infection. Follow up with your doctor. Make sure you are on oxygen at all times.    Acute Bronchitis Bronchitis is inflammation of the airways that extend from the windpipe into the lungs (bronchi). The inflammation often causes mucus to develop. This leads to a cough, which is the most common symptom of bronchitis.  In acute bronchitis, the condition usually develops suddenly and goes away over time, usually in a couple weeks. Smoking, allergies, and asthma can make bronchitis worse. Repeated episodes of bronchitis may cause further lung problems.  CAUSES Acute bronchitis is most often caused by the same virus that causes a cold. The virus can spread from person to person (contagious) through coughing, sneezing, and touching contaminated objects. SIGNS AND SYMPTOMS   Cough.   Fever.   Coughing up mucus.   Body aches.   Chest congestion.   Chills.   Shortness of breath.   Sore throat.  DIAGNOSIS  Acute bronchitis is usually diagnosed through a physical exam. Your health care provider will also ask you questions about your medical history. Tests, such as chest X-rays, are sometimes done to rule out other conditions.  TREATMENT  Acute bronchitis usually goes away in a couple weeks. Oftentimes, no medical treatment is necessary. Medicines are sometimes given for relief of fever or cough. Antibiotic medicines are usually not needed but may be prescribed in certain situations. In some cases, an inhaler may be recommended to help reduce shortness of breath and control the cough. A cool mist vaporizer may also be used to help thin bronchial secretions and make it easier to clear the chest.  HOME CARE INSTRUCTIONS  Get plenty of rest.   Drink enough fluids to keep your urine clear or pale yellow (unless you have a medical condition that requires fluid  restriction). Increasing fluids may help thin your respiratory secretions (sputum) and reduce chest congestion, and it will prevent dehydration.   Take medicines only as directed by your health care provider.  If you were prescribed an antibiotic medicine, finish it all even if you start to feel better.  Avoid smoking and secondhand smoke. Exposure to cigarette smoke or irritating chemicals will make bronchitis worse. If you are a smoker, consider using nicotine gum or skin patches to help control withdrawal symptoms. Quitting smoking will help your lungs heal faster.   Reduce the chances of another bout of acute bronchitis by washing your hands frequently, avoiding people with cold symptoms, and trying not to touch your hands to your mouth, nose, or eyes.   Keep all follow-up visits as directed by your health care provider.  SEEK MEDICAL CARE IF: Your symptoms do not improve after 1 week of treatment.  SEEK IMMEDIATE MEDICAL CARE IF:  You develop an increased fever or chills.   You have chest pain.   You have severe shortness of breath.  You have bloody sputum.   You develop dehydration.  You faint or repeatedly feel like you are going to pass out.  You develop repeated vomiting.  You develop a severe headache. MAKE SURE YOU:   Understand these instructions.  Will watch your condition.  Will get help right away if you are not doing well or get worse.   This information is not intended to replace advice given to you by your health care provider. Make sure you discuss any questions you have with your health care provider.  Document Released: 04/28/2004 Document Revised: 04/11/2014 Document Reviewed: 09/11/2012 Elsevier Interactive Patient Education [employer].

## 2015-01-27 NOTE — Progress Notes (Signed)
[name] has completed 8 fractions to her left lower lobe.  She reports pain due to a headache at a 10/10.  She reports feeling bad starting yesterday and feels like she needs to go the ER.  She has chills, shakes, increased cough with clear mucus.  On arrival in the clinic, her oxygen sat was 67% on 2.5 L of oxygen.  She went up to 89% on her home dose of 4 L.   BP 100/74 mmHg  Pulse 102  Temp(Src) 99 F (37.2 C) (Oral)  Ht _0  (1.651 m)  Wt 251 lb 6.4 oz (114.034 kg)  BMI 41.84 kg/m2  SpO2 67%

## 2015-01-27 NOTE — ED Notes (Signed)
Called placed to Amy, regarding getting patient some oxygen.

## 2015-01-27 NOTE — ED Provider Notes (Signed)
CSN: [id number]     Arrival date & time 01/27/15  1806 History   First MD Initiated Contact with Patient 01/27/15 1814     Chief Complaint  Patient presents with  . Shortness of Breath     (Consider location/radiation/quality/duration/timing/severity/associated sxs/prior Treatment) HPI [name] is a 59 y.o. female with history of coronary disease, pulmonary hypertension, CHF, COPD, recently diagnosed lung cancer, presents to emergency department with shortness of breath. Patient is currently being treated with radiation for her lung cancer. She states she started feeling worse yesterday. She reports productive cough, but states it is chronic. She states she has generalized weakness and chills. She states "i fee like  iam getting sick." Patient apparently was receiving radiation today, when she started becoming more hypoxic. Patient was found to have oxygen saturation of 66% on room air. She was was to be on 4 L but it was discovered that her tank was out of oxygen. Patient was brought to emergency department for further evaluation. Patient states that she is currently being treated for COPD and she does nebulizer treatments several times a day. She states she has not had any today because she has not been feeling well. Pt placed on 4L [place] and by the time pt arrived to ED from cancer center, oxygen sat improved. =  Past Medical History  Diagnosis Date  . Coronary artery disease     S/P PCI of LAD with DES (12/2008). Total occlusion of RCA noted at that time., medically managed. ACS ruled out 03/2009 with Lexiscan myoview . Followed by [facility].  . Pulmonary hypertension ([place])     2-D Echo (30/1601) - Systolic pressure was moderately increased. PA peak pressure  73mHg. 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 Followed by Dr. WJoya Gaskins([facility]  . Diastolic dysfunction     2-D Echo (12/2008) -  Normal LV Systolic funciton with EF 60-65%. Grade 1 diastolid dysfunction. No regional wall motion abnormalities. Moderate pulmonary HTN with PA peak pressure 582mg.  . Marland KitchenOPD (chronic obstructive pulmonary disease) (HCC)     Severe. Gold Stage IV.  PFTs (12/2008) - severe obstructive airway disease. Active tobacco use. Requires 4L O2 at home.  . Pulmonary nodule, right     Small right middle lobe nodule. Stable as of 12/2008.  . Marland Kitchenrediabetes 12/2008    HgbA1c 6.4 (12/2008)  . Hx MRSA infection     Recurrent MRSA thigh abscesses.  . Tobacco abuse     Ongoing.  . Obesity   . Hyperlipidemia   . GERD (gastroesophageal reflux disease)     S/P Nissen fundoplication.  . CHF (congestive heart failure) ([facility]  . H/O right coronary artery stent placement 2010  . On home oxygen therapy since 2010    4L all the time  . Shortness of breath dyspnea     with a lot of exertion; if fluid builds up  . Pneumonia   . Depression   . Arthritis   . Cancer ([facility]    lung cancer  . Full dentures    Past Surgical History  Procedure Laterality Date  . Total abdominal hysterectomy w/ bilateral salpingoophorectomy    . Nissen fundoplication    . Right heart catheterization N/A 11/09/2012    Procedure: RIGHT HEART CATH;  Surgeon: DaLarey DresserMD;  [facility];  Service: Cardiovascular;  Laterality: N/A;  . Appendectomy    .  Tonsillectomy    . Back surgery  [facility], Dr. Lorin Mercy  . Breast surgery  25 years ago    Lumpectomy (biopsy negative)  . Cardiac catheterization  2010    [facility]  . Abdominal hysterectomy    . Knee cartilage surgery Right 10 years ago approx  . Tubal ligation  1979  . Video bronchoscopy with endobronchial ultrasound N/A 12/15/2014    Procedure: VIDEO BRONCHOSCOPY WITH ENDOBRONCHIAL ULTRASOUND;  Surgeon: Ivin Poot, MD;  Location: [facility] OR;  Service: Thoracic;  Laterality: N/A;   Family History  Problem Relation Age of Onset  . Heart disease Mother 78     Deceased from MI at 96yo  . Hypertension Mother   . Heart disease Father 35    Deceased of MI age 32yo  . Hypertension Father   . Hypertension Brother   . Lung cancer      Grandmother   Social History  Substance Use Topics  . Smoking status: Current Every Day Smoker -- 0.25 packs/day for 45 years    Types: Cigarettes    Start date: 04/04/1969  . Smokeless tobacco: Never Used     Comment: 1/2 pack or less. 1 pack to .5 pack per day  . Alcohol Use: No   OB History    No data available     Review of Systems  Constitutional: Negative for fever and chills.  Respiratory: Positive for cough, chest tightness and shortness of breath.   Cardiovascular: Positive for chest pain. Negative for palpitations and leg swelling.  Gastrointestinal: Negative for nausea, vomiting, abdominal pain and diarrhea.  Genitourinary: Negative for dysuria, flank pain and pelvic pain.  Musculoskeletal: Negative for myalgias, arthralgias, neck pain and neck stiffness.  Skin: Negative for rash.  Neurological: Negative for dizziness, weakness and headaches.  All other systems reviewed and are negative.     Allergies  Fluconazole and Atorvastatin  Home Medications   Prior to Admission medications   Medication Sig Start Date End Date Taking? Authorizing Provider  acetaminophen (TYLENOL) 500 MG tablet Take 500 mg by mouth every 6 (six) hours as needed.    Historical Provider, MD  albuterol (PROVENTIL) (2.5 MG/3ML) 0.083% nebulizer solution Take 3 mLs (2.5 mg total) by nebulization every 6 (six) hours as needed for shortness of breath. 09/05/14   Oval Linsey, MD  albuterol (VENTOLIN HFA) 108 (90 BASE) MCG/ACT inhaler Inhale 2 puffs into the lungs every 6 (six) hours as needed for shortness of breath. 12/05/14   Oval Linsey, MD  aspirin 81 MG chewable tablet Chew 1 tablet (81 mg total) by mouth at bedtime. 01/16/15   Oval Linsey, MD  Fluticasone-Salmeterol (ADVAIR DISKUS) 250-50 MCG/DOSE AEPB Inhale 1  puff into the lungs 2 (two) times daily. 09/05/14   Dellia Nims, MD  furosemide (LASIX) 40 MG tablet Take 1 tablet (40 mg total) by mouth daily as needed for fluid. Patient not taking: Reported on 01/27/2015 01/16/15   Oval Linsey, MD  gabapentin (NEURONTIN) 300 MG capsule Take 1 capsule (300 mg total) by mouth 3 (three) times daily. 01/16/15   Oval Linsey, MD  lovastatin (MEVACOR) 40 MG tablet Take 1 tablet (40 mg total) by mouth at bedtime. 09/11/14 09/11/15  Corky Sox, MD  Naproxen Sodium (ALEVE) 220 MG CAPS Take 220 mg by mouth 2 (two) times daily as needed (pain).    Historical Provider, MD  pantoprazole (PROTONIX) 40 MG tablet Take 1 tablet (40 mg total) by mouth daily. 12/05/14  Oval Linsey, MD  potassium chloride SA (K-DUR,KLOR-CON) 20 MEQ tablet Take 2 tablets (40 mEq total) by mouth daily as needed (Take when taking lasix). 01/16/15   Oval Linsey, MD  tiotropium (SPIRIVA HANDIHALER) 18 MCG inhalation capsule Place 1 capsule (18 mcg total) into inhaler and inhale daily. 09/05/14   Tasrif Ahmed, MD   BP 108/83 mmHg  Pulse 92  Temp(Src) 98.4 F (36.9 C) (Oral)  Resp 20  SpO2 91% Physical Exam  Constitutional: She appears well-developed and well-nourished. No distress.  HENT:  Head: Normocephalic.  Eyes: Conjunctivae are normal.  Neck: Neck supple.  Cardiovascular: Normal rate, regular rhythm and normal heart sounds.   Pulmonary/Chest: Effort normal. She has no wheezes. She has no rales.  Decreased air movement bilaterally.   Abdominal: Soft. Bowel sounds are normal. She exhibits no distension. There is no tenderness. There is no rebound.  Musculoskeletal: She exhibits no edema.  Neurological: She is alert.  Skin: Skin is warm and dry.  Psychiatric: She has a normal mood and affect. Her behavior is normal.  Nursing note and vitals reviewed.   ED Course  Procedures (including critical care time) Labs Review Labs Reviewed  CBC WITH DIFFERENTIAL/PLATELET - Abnormal;  Notable for the following:    RBC 3.77 (*)    MCV 106.6 (*)    Lymphs Abs 0.5 (*)    Monocytes Absolute 1.1 (*)    All other components within normal limits  BASIC METABOLIC PANEL - Abnormal; Notable for the following:    Chloride 87 (*)    CO2 43 (*)    Glucose, Bld 149 (*)    BUN 5 (*)    Calcium 8.8 (*)    All other components within normal limits  I-STAT CG4 LACTIC ACID, ED - Abnormal; Notable for the following:    Lactic Acid, Venous 2.11 (*)    All other components within normal limits  BRAIN NATRIURETIC PEPTIDE  I-STAT TROPOININ, ED  I-STAT CG4 LACTIC ACID, ED    Imaging Review Dg Chest 2 View  01/27/2015  CLINICAL DATA:  receiving radiation (earlier today) for lung cancer and started having shortness of breathe associated with weakness, chili's, shaking and coughing up clear mucous. Her O2 was at 66% EXAM: CHEST - 2 VIEW COMPARISON:  12/30/2014 and previous FINDINGS: Left lower lung mass as before. Perihilar interstitial prominence. Heart size upper limits normal. No pneumothorax. No effusion. Surgical clips in the upper abdomen. IMPRESSION: 1. Left lower lobe mass as before.  No acute disease. Electronically Signed   By: Lucrezia Europe M.D.   On: 01/27/2015 19:02   I have personally reviewed and evaluated these images and lab results as part of my medical decision-making.  <ECG> ED ECG REPORT   Date: 01/27/2015  Rate: 60  Rhythm: normal sinus rhythm  QRS Axis: normal  Intervals: normal  ST/T Wave abnormalities: normal  Conduction Disutrbances:none  Narrative Interpretation:   Old EKG Reviewed: unchanged  I have personally reviewed the EKG tracing and agree with the computerized printout as noted.   MDM   Final diagnoses:  Hypoxia  Bronchitis   Pt with shortness of breath, coming from cancer center. Pt with hx of COPD, CHF, lung ca. Apparently her oxygen tank was out, oxygen sat at 66%. Once placed on oxygen, pt's oxygen sat came to mid 90s. Pt admitted to some  SOB since yesterday. Will get labs, CXR, try a duo neb.    No evidence of heart failure based on x-ray and  labs. No changes on the chest x-ray. Negative troponin. Patient continues to maintain oxygen saturation 90s on her usual 4 L. She is afebrile. When I went to speak with patient about discharge, patient admitted that she had no oxygen and her tank and was unable to get home safely. Patient does not drive and usable chair baseline. Advised we can get her home by ptar, however patient refused bc it was expensive. Social worker was consulted. Unable to assist at this time. We tried to call family to see if they can bring her oxygen and take her home. Pt said no one could do that,. I explained to patient that her only option right now is to take [employer] home. I will be discharging her, she does not need to be admitted to the hospital at this time. P Marena Chancy will safely get her home with oxygen where she can be placed on her oxygen at home.  Filed Vitals:   01/27/15 1823 01/27/15 1955 01/27/15 2000 01/27/15 2222  BP: 108/83 107/63 99/52 80/52  Pulse: 92 95 93 96  Temp: 98.4 F (36.9 C) 99 F (37.2 C)    TempSrc: Oral Oral    Resp: _0 SpO2: 91% 91% 92% 98%   Filed Vitals:   01/27/15 1823 01/27/15 1955 01/27/15 2000 01/27/15 2222  BP: 108/83 107/63 99/52 80/52  Pulse: 92 95 93 96  Temp: 98.4 F (36.9 C) 99 F (37.2 C)    TempSrc: Oral Oral    Resp: _1 SpO2: 91% 91% 92% 98%       Jeannett Senior, PA-C 01/27/15 2228  Orpah Greek, MD 01/27/15 2307

## 2015-01-27 NOTE — ED Notes (Signed)
Per [place], patient was receiving radiation for lung cancer and started having shortness of breathe associated with weakness, chili's, shaking and coughing up clear mucous. Her O2 was at 66% but tank was empty.  She is on 4L at home.

## 2015-01-27 NOTE — Progress Notes (Signed)
[place] Work  Clinical Social Work was referred by Pension scheme manager for transportation assistance.  Clinical Social Worker contacted patient by phone.  CSW is familiar with patient, CSW and patient reviewed transportation resources at [facility].  [name] is utilizing [employer] and family when available.  The patient is registered for SCAT.  CSW re-educated on SCAT and [facility] Road to Recovery.  CSW encouraged patient to follow up with CSW if she has any questions or concerns.   Polo Riley, MSW, LCSW, OSW-C Clinical Social Worker [facility] [phone]

## 2015-01-27 NOTE — ED Notes (Addendum)
PTAR contacted

## 2015-01-28 ENCOUNTER — Ambulatory Visit
Admission: RE | Admit: 2015-01-28 | Discharge: 2015-01-28 | Disposition: A | Discharge status: Home / Self Care | Payer: Commercial Managed Care - HMO | Source: Ambulatory Visit | Attending: Radiation Oncology | Admitting: Radiation Oncology

## 2015-01-28 ENCOUNTER — Ambulatory Visit: Payer: Commercial Managed Care - HMO

## 2015-01-28 DIAGNOSIS — R0602 Shortness of breath: Secondary | ICD-10-CM | POA: Diagnosis not present

## 2015-01-29 ENCOUNTER — Ambulatory Visit
Admission: RE | Admit: 2015-01-29 | Discharge: 2015-01-29 | Disposition: A | Discharge status: Home / Self Care | Payer: Commercial Managed Care - HMO | Source: Ambulatory Visit | Attending: Radiation Oncology | Admitting: Radiation Oncology

## 2015-01-29 DIAGNOSIS — C3490 Malignant neoplasm of unspecified part of unspecified bronchus or lung: Secondary | ICD-10-CM | POA: Diagnosis not present

## 2015-01-29 DIAGNOSIS — C3492 Malignant neoplasm of unspecified part of left bronchus or lung: Secondary | ICD-10-CM | POA: Diagnosis not present

## 2015-01-29 DIAGNOSIS — Z510 Encounter for antineoplastic radiation therapy: Secondary | ICD-10-CM | POA: Diagnosis not present

## 2015-01-30 ENCOUNTER — Ambulatory Visit
Admission: RE | Admit: 2015-01-30 | Discharge: 2015-01-30 | Disposition: A | Discharge status: Home / Self Care | Payer: Commercial Managed Care - HMO | Source: Ambulatory Visit | Attending: Radiation Oncology | Admitting: Radiation Oncology

## 2015-01-30 DIAGNOSIS — Z510 Encounter for antineoplastic radiation therapy: Secondary | ICD-10-CM | POA: Diagnosis not present

## 2015-01-30 DIAGNOSIS — C3490 Malignant neoplasm of unspecified part of unspecified bronchus or lung: Secondary | ICD-10-CM | POA: Diagnosis not present

## 2015-01-30 DIAGNOSIS — C3492 Malignant neoplasm of unspecified part of left bronchus or lung: Secondary | ICD-10-CM | POA: Diagnosis not present

## 2015-02-01 ENCOUNTER — Ambulatory Visit: Admission: RE | Admit: 2015-02-01 | Payer: Commercial Managed Care - HMO | Source: Ambulatory Visit

## 2015-02-02 ENCOUNTER — Ambulatory Visit
Admission: RE | Admit: 2015-02-02 | Discharge: 2015-02-02 | Disposition: A | Discharge status: Home / Self Care | Payer: Commercial Managed Care - HMO | Source: Ambulatory Visit | Attending: Radiation Oncology | Admitting: Radiation Oncology

## 2015-02-02 ENCOUNTER — Ambulatory Visit: Payer: Commercial Managed Care - HMO

## 2015-02-02 DIAGNOSIS — C3492 Malignant neoplasm of unspecified part of left bronchus or lung: Secondary | ICD-10-CM | POA: Diagnosis not present

## 2015-02-02 DIAGNOSIS — C3490 Malignant neoplasm of unspecified part of unspecified bronchus or lung: Secondary | ICD-10-CM | POA: Diagnosis not present

## 2015-02-02 DIAGNOSIS — Z510 Encounter for antineoplastic radiation therapy: Secondary | ICD-10-CM | POA: Diagnosis not present

## 2015-02-03 ENCOUNTER — Ambulatory Visit
Admission: RE | Admit: 2015-02-03 | Discharge: 2015-02-03 | Disposition: A | Discharge status: Home / Self Care | Payer: Commercial Managed Care - HMO | Source: Ambulatory Visit | Attending: Radiation Oncology | Admitting: Radiation Oncology

## 2015-02-03 ENCOUNTER — Encounter: Payer: Self-pay | Admitting: Radiation Oncology

## 2015-02-03 VITALS — BP 109/57 | Temp 99.3°F | Ht 65.0 in | Wt 250.7 lb

## 2015-02-03 DIAGNOSIS — C3490 Malignant neoplasm of unspecified part of unspecified bronchus or lung: Secondary | ICD-10-CM | POA: Diagnosis not present

## 2015-02-03 DIAGNOSIS — C3492 Malignant neoplasm of unspecified part of left bronchus or lung: Secondary | ICD-10-CM

## 2015-02-03 DIAGNOSIS — Z510 Encounter for antineoplastic radiation therapy: Secondary | ICD-10-CM | POA: Diagnosis not present

## 2015-02-03 NOTE — Progress Notes (Signed)
[name] is here for her 12th fraction of radiation to her Left Lower Lobe. She reports being tired all the time, and very frustrated with this feeling. Her oxygen level is 95% on 5L pulsating oxygen but she is on oxygen at 4 Liters at home. The skin to her Left lateral side is normal in appearance and she denies any pain or tenderness in this area. She reports a decreased appetite, and does not eat well at home most days, but some days she states she can't stop eating. Her grandson lives with her, and helps her if she asks him for something.   BP 109/57 mmHg  Temp(Src) 99.3 F (37.4 C)  Ht _0  (1.651 m)  Wt 250 lb 11.2 oz (113.717 kg)  BMI 41.72 kg/m2  SpO2 95%   Wt Readings from Last 3 Encounters:  02/03/15 250 lb 11.2 oz (113.717 kg)  01/27/15 251 lb 6.4 oz (114.034 kg)  01/16/15 253 lb 14.4 oz (115.168 kg)

## 2015-02-03 NOTE — Addendum Note (Signed)
Encounter addended by: Thea Silversmith, MD on: 02/03/2015  4:35 PM<BR>     Documentation filed: Notes Section, Problem List

## 2015-02-03 NOTE — Progress Notes (Signed)
Weekly Management Note Current Dose:21.6 Gy  Projected Dose: 70.2 Gy   Narrative:  The patient presents for routine under treatment assessment.  CBCT/MVCT images/Port film x-rays were reviewed.  The chart was checked. To ER. No O2 available. Family unsupportive. Has reflux due to recent steroids.   Physical Findings: Pale. Normal respiratory effort.   Vitals:  Filed Vitals:   02/03/15 1611  BP: 109/57  Temp: 99.3 F (37.4 C)   Weight:  Wt Readings from Last 3 Encounters:  02/03/15 250 lb 11.2 oz (113.717 kg)  01/27/15 251 lb 6.4 oz (114.034 kg)  01/16/15 253 lb 14.4 oz (115.168 kg)   Lab Results  Component Value Date   WBC 9.4 01/27/2015   HGB 12.5 01/27/2015   HCT 40.2 01/27/2015   MCV 106.6* 01/27/2015   PLT 267 01/27/2015   Lab Results  Component Value Date   CREATININE 0.70 01/27/2015   BUN 5* 01/27/2015   NA 137 01/27/2015   K 3.9 01/27/2015   CL 87* 01/27/2015   CO2 43* 01/27/2015     Impression:  The patient is tolerating radiation.  Plan:  Continue treatment as planned. Pepcid or prilosec for reflux.

## 2015-02-04 ENCOUNTER — Ambulatory Visit
Admission: RE | Admit: 2015-02-04 | Discharge: 2015-02-04 | Disposition: A | Discharge status: Home / Self Care | Payer: Commercial Managed Care - HMO | Source: Ambulatory Visit | Attending: Radiation Oncology | Admitting: Radiation Oncology

## 2015-02-04 DIAGNOSIS — C3492 Malignant neoplasm of unspecified part of left bronchus or lung: Secondary | ICD-10-CM | POA: Diagnosis not present

## 2015-02-04 DIAGNOSIS — C3490 Malignant neoplasm of unspecified part of unspecified bronchus or lung: Secondary | ICD-10-CM | POA: Diagnosis not present

## 2015-02-04 DIAGNOSIS — Z510 Encounter for antineoplastic radiation therapy: Secondary | ICD-10-CM | POA: Diagnosis not present

## 2015-02-05 ENCOUNTER — Ambulatory Visit
Admission: RE | Admit: 2015-02-05 | Discharge: 2015-02-05 | Disposition: A | Discharge status: Home / Self Care | Payer: Commercial Managed Care - HMO | Source: Ambulatory Visit | Attending: Radiation Oncology | Admitting: Radiation Oncology

## 2015-02-05 ENCOUNTER — Encounter: Payer: Self-pay | Admitting: Radiation Oncology

## 2015-02-05 ENCOUNTER — Encounter: Payer: Self-pay | Admitting: *Deleted

## 2015-02-05 DIAGNOSIS — J449 Chronic obstructive pulmonary disease, unspecified: Secondary | ICD-10-CM | POA: Diagnosis not present

## 2015-02-05 DIAGNOSIS — Z510 Encounter for antineoplastic radiation therapy: Secondary | ICD-10-CM | POA: Diagnosis not present

## 2015-02-05 DIAGNOSIS — R0902 Hypoxemia: Secondary | ICD-10-CM | POA: Diagnosis not present

## 2015-02-05 DIAGNOSIS — J9600 Acute respiratory failure, unspecified whether with hypoxia or hypercapnia: Secondary | ICD-10-CM | POA: Diagnosis not present

## 2015-02-05 DIAGNOSIS — C3490 Malignant neoplasm of unspecified part of unspecified bronchus or lung: Secondary | ICD-10-CM | POA: Diagnosis not present

## 2015-02-05 DIAGNOSIS — C3492 Malignant neoplasm of unspecified part of left bronchus or lung: Secondary | ICD-10-CM | POA: Diagnosis not present

## 2015-02-05 NOTE — Progress Notes (Signed)
[place] Work  Clinical Social Work was referred by ED RNCM.  CSW attempted to call patient and left voicemail to return call when available.  Polo Riley, MSW, LCSW, OSW-C Clinical Social Worker [facility] [phone]

## 2015-02-05 NOTE — Progress Notes (Addendum)
[name] fell while getting out of her car in [place]te [place] area.  Has a mild injury to her left knee.  Area cleansed with Normal saline. Grades pain as a level 7/10. Note hypotension  BP 92/56 mmHg  Pulse 88  Temp(Src) 98.8 F (37.1 C)   Prior BP on 02/03/15 BP 109/57

## 2015-02-06 ENCOUNTER — Ambulatory Visit
Admission: RE | Admit: 2015-02-06 | Discharge: 2015-02-06 | Disposition: A | Discharge status: Home / Self Care | Payer: Commercial Managed Care - HMO | Source: Ambulatory Visit | Attending: Radiation Oncology | Admitting: Radiation Oncology

## 2015-02-06 DIAGNOSIS — C3492 Malignant neoplasm of unspecified part of left bronchus or lung: Secondary | ICD-10-CM | POA: Diagnosis not present

## 2015-02-06 DIAGNOSIS — Z510 Encounter for antineoplastic radiation therapy: Secondary | ICD-10-CM | POA: Diagnosis not present

## 2015-02-06 DIAGNOSIS — C3490 Malignant neoplasm of unspecified part of unspecified bronchus or lung: Secondary | ICD-10-CM | POA: Diagnosis not present

## 2015-02-08 ENCOUNTER — Other Ambulatory Visit: Payer: Self-pay | Admitting: Internal Medicine

## 2015-02-08 DIAGNOSIS — K219 Gastro-esophageal reflux disease without esophagitis: Secondary | ICD-10-CM

## 2015-02-09 ENCOUNTER — Ambulatory Visit
Admission: RE | Admit: 2015-02-09 | Discharge: 2015-02-09 | Disposition: A | Discharge status: Home / Self Care | Payer: Commercial Managed Care - HMO | Source: Ambulatory Visit | Attending: Radiation Oncology | Admitting: Radiation Oncology

## 2015-02-09 DIAGNOSIS — C3490 Malignant neoplasm of unspecified part of unspecified bronchus or lung: Secondary | ICD-10-CM | POA: Diagnosis not present

## 2015-02-09 DIAGNOSIS — C3492 Malignant neoplasm of unspecified part of left bronchus or lung: Secondary | ICD-10-CM | POA: Diagnosis not present

## 2015-02-09 DIAGNOSIS — Z510 Encounter for antineoplastic radiation therapy: Secondary | ICD-10-CM | POA: Diagnosis not present

## 2015-02-10 ENCOUNTER — Ambulatory Visit
Admission: RE | Admit: 2015-02-10 | Discharge: 2015-02-10 | Disposition: A | Discharge status: Home / Self Care | Payer: Commercial Managed Care - HMO | Source: Ambulatory Visit | Attending: Radiation Oncology | Admitting: Radiation Oncology

## 2015-02-10 VITALS — BP 110/63 | HR 95 | Resp 18 | Wt 254.0 lb

## 2015-02-10 DIAGNOSIS — Z510 Encounter for antineoplastic radiation therapy: Secondary | ICD-10-CM | POA: Diagnosis not present

## 2015-02-10 DIAGNOSIS — C3492 Malignant neoplasm of unspecified part of left bronchus or lung: Secondary | ICD-10-CM

## 2015-02-10 DIAGNOSIS — C3490 Malignant neoplasm of unspecified part of unspecified bronchus or lung: Secondary | ICD-10-CM | POA: Diagnosis not present

## 2015-02-10 NOTE — Progress Notes (Addendum)
Vitals stable. Oxygen 4 liters pulsating noted. Denies pain but does reports discomfort on left side that is constant and worse with deep breaths. Reports a dry cough during the day and a productive cough at night with clear mucous. Denies hemoptysis. Denies skin changes within treatment field. Denies using radiaplex to date. Denies painful or difficult swallowing. Weight elevated 4 lb. Informed patient since she has been holding her Lasix.   BP 110/63 mmHg  Pulse 95  Resp 18  Wt 254 lb (115.214 kg)  SpO2 96% Wt Readings from Last 3 Encounters:  02/10/15 254 lb (115.214 kg)  02/03/15 250 lb 11.2 oz (113.717 kg)  01/27/15 251 lb 6.4 oz (114.034 kg)

## 2015-02-10 NOTE — Progress Notes (Signed)
Weekly Management Note Current Dose:45.9 Gy  Projected Dose: 70.2 Gy   Narrative:  The patient presents for routine under treatment assessment.  CBCT/MVCT images/Port film x-rays were reviewed.  The chart was checked. Knee better. C/o left chest wall pain. Worse with respiration. With roommate today.   Physical Findings: Pale. Normal respiratory effort.   Vitals:  Filed Vitals:   02/10/15 0903  BP: 110/63  Pulse: 95  Resp: 18   Weight:  Wt Readings from Last 3 Encounters:  02/10/15 254 lb (115.214 kg)  02/03/15 250 lb 11.2 oz (113.717 kg)  01/27/15 251 lb 6.4 oz (114.034 kg)   Lab Results  Component Value Date   WBC 9.4 01/27/2015   HGB 12.5 01/27/2015   HCT 40.2 01/27/2015   MCV 106.6* 01/27/2015   PLT 267 01/27/2015   Lab Results  Component Value Date   CREATININE 0.70 01/27/2015   BUN 5* 01/27/2015   NA 137 01/27/2015   K 3.9 01/27/2015   CL 87* 01/27/2015   CO2 43* 01/27/2015     Impression:  The patient is tolerating radiation.  Plan:  Continue treatment as planned. Pepcid or prilosec for reflux. No pain meds for now for left chest wall pain. If needed, will let nurse know and we can prescribe some tramadol.

## 2015-02-11 ENCOUNTER — Ambulatory Visit
Admission: RE | Admit: 2015-02-11 | Discharge: 2015-02-11 | Disposition: A | Discharge status: Home / Self Care | Payer: Commercial Managed Care - HMO | Source: Ambulatory Visit | Attending: Radiation Oncology | Admitting: Radiation Oncology

## 2015-02-11 DIAGNOSIS — C3492 Malignant neoplasm of unspecified part of left bronchus or lung: Secondary | ICD-10-CM | POA: Diagnosis not present

## 2015-02-11 DIAGNOSIS — C3490 Malignant neoplasm of unspecified part of unspecified bronchus or lung: Secondary | ICD-10-CM | POA: Diagnosis not present

## 2015-02-11 DIAGNOSIS — Z510 Encounter for antineoplastic radiation therapy: Secondary | ICD-10-CM | POA: Diagnosis not present

## 2015-02-12 ENCOUNTER — Ambulatory Visit
Admission: RE | Admit: 2015-02-12 | Discharge: 2015-02-12 | Disposition: A | Discharge status: Home / Self Care | Payer: Commercial Managed Care - HMO | Source: Ambulatory Visit | Attending: Radiation Oncology | Admitting: Radiation Oncology

## 2015-02-12 DIAGNOSIS — C3490 Malignant neoplasm of unspecified part of unspecified bronchus or lung: Secondary | ICD-10-CM | POA: Diagnosis not present

## 2015-02-12 DIAGNOSIS — Z510 Encounter for antineoplastic radiation therapy: Secondary | ICD-10-CM | POA: Diagnosis not present

## 2015-02-12 DIAGNOSIS — C3492 Malignant neoplasm of unspecified part of left bronchus or lung: Secondary | ICD-10-CM | POA: Diagnosis not present

## 2015-02-13 ENCOUNTER — Ambulatory Visit
Admission: RE | Admit: 2015-02-13 | Discharge: 2015-02-13 | Disposition: A | Discharge status: Home / Self Care | Payer: Commercial Managed Care - HMO | Source: Ambulatory Visit | Attending: Radiation Oncology | Admitting: Radiation Oncology

## 2015-02-13 DIAGNOSIS — Z510 Encounter for antineoplastic radiation therapy: Secondary | ICD-10-CM | POA: Diagnosis not present

## 2015-02-13 DIAGNOSIS — C3490 Malignant neoplasm of unspecified part of unspecified bronchus or lung: Secondary | ICD-10-CM | POA: Diagnosis not present

## 2015-02-13 DIAGNOSIS — C3492 Malignant neoplasm of unspecified part of left bronchus or lung: Secondary | ICD-10-CM | POA: Diagnosis not present

## 2015-02-15 ENCOUNTER — Encounter (HOSPITAL_COMMUNITY): Payer: Self-pay

## 2015-02-15 ENCOUNTER — Emergency Department (HOSPITAL_COMMUNITY): Payer: Commercial Managed Care - HMO

## 2015-02-15 ENCOUNTER — Inpatient Hospital Stay (HOSPITAL_COMMUNITY)
Admission: EM | Admit: 2015-02-15 | Discharge: 2015-02-18 | DRG: 190 | Disposition: A | Discharge status: Home Health | Payer: Commercial Managed Care - HMO | Attending: Internal Medicine | Admitting: Internal Medicine

## 2015-02-15 DIAGNOSIS — Z8614 Personal history of Methicillin resistant Staphylococcus aureus infection: Secondary | ICD-10-CM

## 2015-02-15 DIAGNOSIS — F17200 Nicotine dependence, unspecified, uncomplicated: Secondary | ICD-10-CM | POA: Diagnosis present

## 2015-02-15 DIAGNOSIS — I2510 Atherosclerotic heart disease of native coronary artery without angina pectoris: Secondary | ICD-10-CM | POA: Diagnosis present

## 2015-02-15 DIAGNOSIS — R0602 Shortness of breath: Secondary | ICD-10-CM | POA: Diagnosis not present

## 2015-02-15 DIAGNOSIS — W19XXXA Unspecified fall, initial encounter: Secondary | ICD-10-CM

## 2015-02-15 DIAGNOSIS — I5032 Chronic diastolic (congestive) heart failure: Secondary | ICD-10-CM | POA: Diagnosis present

## 2015-02-15 DIAGNOSIS — I5030 Unspecified diastolic (congestive) heart failure: Secondary | ICD-10-CM | POA: Diagnosis present

## 2015-02-15 DIAGNOSIS — S8992XA Unspecified injury of left lower leg, initial encounter: Secondary | ICD-10-CM | POA: Diagnosis not present

## 2015-02-15 DIAGNOSIS — I272 Other secondary pulmonary hypertension: Secondary | ICD-10-CM | POA: Diagnosis not present

## 2015-02-15 DIAGNOSIS — Z7982 Long term (current) use of aspirin: Secondary | ICD-10-CM

## 2015-02-15 DIAGNOSIS — R7303 Prediabetes: Secondary | ICD-10-CM | POA: Diagnosis present

## 2015-02-15 DIAGNOSIS — J440 Chronic obstructive pulmonary disease with acute lower respiratory infection: Secondary | ICD-10-CM | POA: Diagnosis not present

## 2015-02-15 DIAGNOSIS — M1990 Unspecified osteoarthritis, unspecified site: Secondary | ICD-10-CM | POA: Diagnosis present

## 2015-02-15 DIAGNOSIS — M79662 Pain in left lower leg: Secondary | ICD-10-CM | POA: Diagnosis not present

## 2015-02-15 DIAGNOSIS — E538 Deficiency of other specified B group vitamins: Secondary | ICD-10-CM | POA: Diagnosis present

## 2015-02-15 DIAGNOSIS — Z90710 Acquired absence of both cervix and uterus: Secondary | ICD-10-CM | POA: Diagnosis not present

## 2015-02-15 DIAGNOSIS — Z923 Personal history of irradiation: Secondary | ICD-10-CM

## 2015-02-15 DIAGNOSIS — Z955 Presence of coronary angioplasty implant and graft: Secondary | ICD-10-CM

## 2015-02-15 DIAGNOSIS — Z9981 Dependence on supplemental oxygen: Secondary | ICD-10-CM

## 2015-02-15 DIAGNOSIS — S93402A Sprain of unspecified ligament of left ankle, initial encounter: Secondary | ICD-10-CM | POA: Diagnosis present

## 2015-02-15 DIAGNOSIS — J9611 Chronic respiratory failure with hypoxia: Secondary | ICD-10-CM | POA: Diagnosis present

## 2015-02-15 DIAGNOSIS — E785 Hyperlipidemia, unspecified: Secondary | ICD-10-CM | POA: Diagnosis present

## 2015-02-15 DIAGNOSIS — K219 Gastro-esophageal reflux disease without esophagitis: Secondary | ICD-10-CM | POA: Diagnosis present

## 2015-02-15 DIAGNOSIS — Y92002 Bathroom of unspecified non-institutional (private) residence single-family (private) house as the place of occurrence of the external cause: Secondary | ICD-10-CM | POA: Diagnosis not present

## 2015-02-15 DIAGNOSIS — W108XXA Fall (on) (from) other stairs and steps, initial encounter: Secondary | ICD-10-CM | POA: Diagnosis present

## 2015-02-15 DIAGNOSIS — C3492 Malignant neoplasm of unspecified part of left bronchus or lung: Secondary | ICD-10-CM | POA: Diagnosis not present

## 2015-02-15 DIAGNOSIS — Z888 Allergy status to other drugs, medicaments and biological substances status: Secondary | ICD-10-CM

## 2015-02-15 DIAGNOSIS — G63 Polyneuropathy in diseases classified elsewhere: Secondary | ICD-10-CM | POA: Diagnosis present

## 2015-02-15 DIAGNOSIS — J9612 Chronic respiratory failure with hypercapnia: Secondary | ICD-10-CM | POA: Diagnosis present

## 2015-02-15 DIAGNOSIS — E876 Hypokalemia: Secondary | ICD-10-CM | POA: Diagnosis present

## 2015-02-15 DIAGNOSIS — J189 Pneumonia, unspecified organism: Secondary | ICD-10-CM | POA: Diagnosis present

## 2015-02-15 DIAGNOSIS — F17210 Nicotine dependence, cigarettes, uncomplicated: Secondary | ICD-10-CM | POA: Diagnosis not present

## 2015-02-15 DIAGNOSIS — I959 Hypotension, unspecified: Secondary | ICD-10-CM

## 2015-02-15 DIAGNOSIS — S99912A Unspecified injury of left ankle, initial encounter: Secondary | ICD-10-CM | POA: Diagnosis not present

## 2015-02-15 DIAGNOSIS — J449 Chronic obstructive pulmonary disease, unspecified: Secondary | ICD-10-CM

## 2015-02-15 DIAGNOSIS — Z720 Tobacco use: Secondary | ICD-10-CM | POA: Diagnosis present

## 2015-02-15 DIAGNOSIS — M25572 Pain in left ankle and joints of left foot: Secondary | ICD-10-CM | POA: Diagnosis not present

## 2015-02-15 DIAGNOSIS — Z79899 Other long term (current) drug therapy: Secondary | ICD-10-CM

## 2015-02-15 DIAGNOSIS — Y92009 Unspecified place in unspecified non-institutional (private) residence as the place of occurrence of the external cause: Secondary | ICD-10-CM

## 2015-02-15 LAB — BRAIN NATRIURETIC PEPTIDE: B Natriuretic Peptide: 145.4 pg/mL — ABNORMAL HIGH (ref 0.0–100.0)

## 2015-02-15 LAB — MRSA PCR SCREENING: MRSA by PCR: NEGATIVE

## 2015-02-15 LAB — CBC
HCT: 43.5 % (ref 36.0–46.0)
Hemoglobin: 13 g/dL (ref 12.0–15.0)
MCH: 31.9 pg (ref 26.0–34.0)
MCHC: 29.9 g/dL — ABNORMAL LOW (ref 30.0–36.0)
MCV: 106.6 fL — ABNORMAL HIGH (ref 78.0–100.0)
PLATELETS: 179 10*3/uL (ref 150–400)
RBC: 4.08 MIL/uL (ref 3.87–5.11)
RDW: 13.9 % (ref 11.5–15.5)
WBC: 7.7 10*3/uL (ref 4.0–10.5)

## 2015-02-15 LAB — I-STAT TROPONIN, ED: Troponin i, poc: 0 ng/mL (ref 0.00–0.08)

## 2015-02-15 LAB — BASIC METABOLIC PANEL
ANION GAP: 5 (ref 5–15)
BUN: <5 mg/dL — ABNORMAL LOW (ref 6–20)
CALCIUM: 8.1 mg/dL — AB (ref 8.9–10.3)
CO2: 43 mmol/L — AB (ref 22–32)
Chloride: 92 mmol/L — ABNORMAL LOW (ref 101–111)
Creatinine, Ser: 0.75 mg/dL (ref 0.44–1.00)
GFR calc non Af Amer: >60 mL/min (ref >60)
Glucose, Bld: 176 mg/dL — ABNORMAL HIGH (ref 65–99)
Potassium: 3.2 mmol/L — ABNORMAL LOW (ref 3.5–5.1)
Sodium: 140 mmol/L (ref 135–145)

## 2015-02-15 LAB — PROTIME-INR
INR: 1.02 (ref 0.00–1.49)
Prothrombin Time: 13.6 seconds (ref 11.6–15.2)

## 2015-02-15 LAB — I-STAT CG4 LACTIC ACID, ED: Lactic Acid, Venous: 1.27 mmol/L (ref 0.5–2.0)

## 2015-02-15 LAB — D-DIMER, QUANTITATIVE (NOT AT ARMC): D DIMER QUANT: 1.12 ug{FEU}/mL — AB (ref 0.00–0.48)

## 2015-02-15 MED ORDER — SODIUM CHLORIDE 0.9 % IJ SOLN
3.0000 mL | Freq: Two times a day (BID) | INTRAMUSCULAR | Status: DC
  Administered 2015-02-15 – 2015-02-18 (×6): 3 mL via INTRAVENOUS

## 2015-02-15 MED ORDER — VANCOMYCIN HCL IN DEXTROSE 750-5 MG/150ML-% IV SOLN
750.0000 mg | Freq: Two times a day (BID) | INTRAVENOUS | Status: DC
  Filled 2015-02-15 (×2): qty 150

## 2015-02-15 MED ORDER — POTASSIUM CHLORIDE CRYS ER 20 MEQ PO TBCR
40.0000 meq | EXTENDED_RELEASE_TABLET | Freq: Once | ORAL | Status: AC
  Administered 2015-02-15: 40 meq via ORAL
  Filled 2015-02-15: qty 2

## 2015-02-15 MED ORDER — MOMETASONE FURO-FORMOTEROL FUM 100-5 MCG/ACT IN AERO
2.0000 | INHALATION_SPRAY | Freq: Two times a day (BID) | RESPIRATORY_TRACT | Status: DC
  Administered 2015-02-15 – 2015-02-18 (×6): 2 via RESPIRATORY_TRACT
  Filled 2015-02-15: qty 8.8

## 2015-02-15 MED ORDER — GABAPENTIN 300 MG PO CAPS
300.0000 mg | ORAL_CAPSULE | Freq: Three times a day (TID) | ORAL | Status: DC
  Administered 2015-02-15 – 2015-02-18 (×8): 300 mg via ORAL
  Filled 2015-02-15 (×8): qty 1

## 2015-02-15 MED ORDER — PIPERACILLIN-TAZOBACTAM 3.375 G IVPB
3.3750 g | Freq: Three times a day (TID) | INTRAVENOUS | Status: DC
  Administered 2015-02-16: 3.375 g via INTRAVENOUS
  Filled 2015-02-15 (×3): qty 50

## 2015-02-15 MED ORDER — PIPERACILLIN-TAZOBACTAM 3.375 G IVPB
3.3750 g | Freq: Four times a day (QID) | INTRAVENOUS | Status: DC
  Administered 2015-02-15: 3.375 g via INTRAVENOUS
  Filled 2015-02-15 (×3): qty 50

## 2015-02-15 MED ORDER — CYANOCOBALAMIN 1000 MCG/ML IJ SOLN
1000.0000 ug | Freq: Once | INTRAMUSCULAR | Status: AC
  Administered 2015-02-16: 1000 ug via INTRAMUSCULAR
  Filled 2015-02-15: qty 1

## 2015-02-15 MED ORDER — PRAVASTATIN SODIUM 40 MG PO TABS
40.0000 mg | ORAL_TABLET | Freq: Every day | ORAL | Status: DC
  Administered 2015-02-16 – 2015-02-17 (×2): 40 mg via ORAL
  Filled 2015-02-15 (×2): qty 1

## 2015-02-15 MED ORDER — SODIUM CHLORIDE 0.9 % IV BOLUS (SEPSIS)
1000.0000 mL | Freq: Once | INTRAVENOUS | Status: AC
  Administered 2015-02-15: 1000 mL via INTRAVENOUS

## 2015-02-15 MED ORDER — VANCOMYCIN HCL IN DEXTROSE 1-5 GM/200ML-% IV SOLN
1000.0000 mg | Freq: Once | INTRAVENOUS | Status: AC
  Administered 2015-02-15: 1000 mg via INTRAVENOUS
  Filled 2015-02-15: qty 200

## 2015-02-15 MED ORDER — ALBUTEROL SULFATE HFA 108 (90 BASE) MCG/ACT IN AERS: 2.0000 | INHALATION_SPRAY | Freq: Four times a day (QID) | RESPIRATORY_TRACT | Status: DC | PRN

## 2015-02-15 MED ORDER — PANTOPRAZOLE SODIUM 40 MG PO TBEC
40.0000 mg | DELAYED_RELEASE_TABLET | Freq: Every day | ORAL | Status: DC
  Administered 2015-02-16 – 2015-02-18 (×3): 40 mg via ORAL
  Filled 2015-02-15 (×3): qty 1

## 2015-02-15 MED ORDER — ASPIRIN 81 MG PO CHEW
81.0000 mg | CHEWABLE_TABLET | Freq: Every day | ORAL | Status: DC
  Administered 2015-02-15 – 2015-02-17 (×3): 81 mg via ORAL
  Filled 2015-02-15 (×3): qty 1

## 2015-02-15 MED ORDER — TIOTROPIUM BROMIDE MONOHYDRATE 18 MCG IN CAPS
18.0000 ug | ORAL_CAPSULE | Freq: Every day | RESPIRATORY_TRACT | Status: DC
  Administered 2015-02-16 – 2015-02-18 (×3): 18 ug via RESPIRATORY_TRACT
  Filled 2015-02-15: qty 5

## 2015-02-15 MED ORDER — ACETAMINOPHEN 500 MG PO TABS
500.0000 mg | ORAL_TABLET | Freq: Four times a day (QID) | ORAL | Status: DC | PRN
  Administered 2015-02-15: 500 mg via ORAL
  Filled 2015-02-15: qty 1

## 2015-02-15 MED ORDER — ALBUTEROL SULFATE (2.5 MG/3ML) 0.083% IN NEBU
2.5000 mg | INHALATION_SOLUTION | Freq: Four times a day (QID) | RESPIRATORY_TRACT | Status: DC | PRN
  Administered 2015-02-16 – 2015-02-17 (×4): 2.5 mg via RESPIRATORY_TRACT
  Filled 2015-02-15 (×4): qty 3

## 2015-02-15 MED ORDER — POTASSIUM CHLORIDE CRYS ER 20 MEQ PO TBCR: 40.0000 meq | EXTENDED_RELEASE_TABLET | Freq: Every day | ORAL | Status: DC | PRN

## 2015-02-15 MED ORDER — FUROSEMIDE 40 MG PO TABS: 40.0000 mg | ORAL_TABLET | Freq: Every day | ORAL | Status: DC | PRN

## 2015-02-15 MED ORDER — IOHEXOL 350 MG/ML SOLN
90.0000 mL | Freq: Once | INTRAVENOUS | Status: AC | PRN
  Administered 2015-02-15: 90 mL via INTRAVENOUS

## 2015-02-15 MED ORDER — NAPROXEN 250 MG PO TABS
500.0000 mg | ORAL_TABLET | Freq: Two times a day (BID) | ORAL | Status: DC | PRN
  Administered 2015-02-16 – 2015-02-17 (×2): 500 mg via ORAL
  Filled 2015-02-15 (×2): qty 2

## 2015-02-15 MED ORDER — NAPROXEN 250 MG PO TABS: 500.0000 mg | ORAL_TABLET | Freq: Two times a day (BID) | ORAL | Status: DC

## 2015-02-15 MED ORDER — FUROSEMIDE 40 MG PO TABS
40.0000 mg | ORAL_TABLET | Freq: Every day | ORAL | Status: DC
  Administered 2015-02-17 – 2015-02-18 (×2): 40 mg via ORAL
  Filled 2015-02-15 (×3): qty 1

## 2015-02-15 MED ORDER — ENOXAPARIN SODIUM 40 MG/0.4ML ~~LOC~~ SOLN: 40.0000 mg | SUBCUTANEOUS | Status: DC

## 2015-02-15 NOTE — Progress Notes (Signed)
ANTIBIOTIC CONSULT NOTE - INITIAL  Pharmacy Consult for vancomycin Indication: HCAP  Allergies  Allergen Reactions  . Fluconazole Anaphylaxis, Itching and Swelling  . Atorvastatin Other (See Comments)    Dizziness after med started and resolved per pt after med stopped. September 08 2014    Patient Measurements: Height: _0  (165.1 cm) Weight: 262 lb 9.1 oz (119.1 kg) IBW/kg (Calculated) : 57  Vital Signs: Temp: 99.1 F (37.3 C) (11/13 2020) Temp Source: Oral (11/13 2020) BP: 102/76 mmHg (11/13 2020) Pulse Rate: 100 (11/13 2020) Intake/Output from previous day:   Intake/Output from this shift: Total I/O In: 250 [IV Piggyback:250] Out: 400 [Urine:400]  Labs:  Recent Labs  02/15/15 1345  WBC 7.7  HGB 13.0  PLT 179  CREATININE 0.75   Estimated Creatinine Clearance: 96.6 mL/min (by C-G formula based on Cr of 0.75). No results for input(s): VANCOTROUGH, VANCOPEAK, VANCORANDOM, GENTTROUGH, GENTPEAK, GENTRANDOM, TOBRATROUGH, TOBRAPEAK, TOBRARND, AMIKACINPEAK, AMIKACINTROU, AMIKACIN in the last 72 hours.   Microbiology: No results found for this or any previous visit (from the past 720 hour(s)).  Medical History: Past Medical History  Diagnosis Date  . Coronary artery disease     S/P PCI of LAD with DES (12/2008). Total occlusion of RCA noted at that time., medically managed. ACS ruled out 03/2009 with Lexiscan myoview . Followed by [facility].  . Pulmonary hypertension ([place])     2-D Echo (75/1025) - Systolic pressure was moderately increased. PA peak pressure  45mHg. 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 Followed by Dr. WJoya Gaskins([facility]  . Diastolic dysfunction     2-D Echo (12/2008) - Normal LV Systolic funciton with EF 60-65%. Grade 1 diastolid dysfunction. No regional wall motion abnormalities. Moderate pulmonary HTN with PA peak pressure 529mg.  . Marland KitchenOPD (chronic  obstructive pulmonary disease) (HCC)     Severe. Gold Stage IV.  PFTs (12/2008) - severe obstructive airway disease. Active tobacco use. Requires 4L O2 at home.  . Pulmonary nodule, right     Small right middle lobe nodule. Stable as of 12/2008.  . Marland Kitchenrediabetes 12/2008    HgbA1c 6.4 (12/2008)  . Hx MRSA infection     Recurrent MRSA thigh abscesses.  . Tobacco abuse     Ongoing.  . Obesity   . Hyperlipidemia   . GERD (gastroesophageal reflux disease)     S/P Nissen fundoplication.  . [place]ive heart failure[place]  . H/O right coronary artery stent placement 2010  . On home oxygen therapy since 2010    4L all the time  . Shortness of breath dyspnea     with a lot of exertion; if fluid builds up  . Pneumonia   . Depression   . Arthritis   . Cancer [place] [place]    lung cancer  . Full dentures     Medications:  Prescriptions prior to admission  Medication Sig Dispense Refill Last Dose  . acetaminophen (TYLENOL) 500 MG tablet Take 500 mg by mouth every 6 (six) hours as needed for moderate pain.    02/14/2015 at Unknown time  . albuterol (PROVENTIL) (2.5 MG/3ML) 0.083% nebulizer solution Take 3 mLs (2.5 mg total) by nebulization every 6 (six) hours as needed for shortness of breath. 360 mL 3 02/14/2015 at Unknown time  . albuterol (VENTOLIN HFA) 108 (90 BASE) MCG/ACT inhaler Inhale 2 puffs into the lungs every 6 (six) hours as needed  for shortness of breath. 18 g 11 02/15/2015 at Unknown time  . aspirin 81 MG chewable tablet Chew 1 tablet (81 mg total) by mouth at bedtime. 90 tablet 3 02/14/2015 at Unknown time  . Fluticasone-Salmeterol (ADVAIR DISKUS) 250-50 MCG/DOSE AEPB Inhale 1 puff into the lungs 2 (two) times daily. 60 each 5 02/15/2015 at Unknown time  . furosemide (LASIX) 20 MG tablet Take 20 mg by mouth 2 (two) times daily as needed for fluid or edema.   Past Month at Unknown time  . furosemide (LASIX) 40 MG tablet Take 1 tablet (40 mg total) by mouth daily as needed for  fluid. 90 tablet 3 02/14/2015 at Unknown time  . gabapentin (NEURONTIN) 300 MG capsule Take 1 capsule (300 mg total) by mouth 3 (three) times daily. 90 capsule 11 02/14/2015 at Unknown time  . lovastatin (MEVACOR) 40 MG tablet Take 1 tablet (40 mg total) by mouth at bedtime. 30 tablet 5 02/14/2015 at Unknown time  . Naproxen Sodium (ALEVE) 220 MG CAPS Take 220 mg by mouth 2 (two) times daily as needed (pain).   Past Month at Unknown time  . pantoprazole (PROTONIX) 40 MG tablet Take 1 tablet (40 mg total) by mouth daily. 90 tablet 3 Past Month at Unknown time  . potassium chloride SA (K-DUR,KLOR-CON) 20 MEQ tablet Take 2 tablets (40 mEq total) by mouth daily as needed (Take when taking lasix). (Patient taking differently: Take 20 mEq by mouth daily as needed (Take when taking lasix). ) 180 tablet 3 Past Month at Unknown time  . tiotropium (SPIRIVA HANDIHALER) 18 MCG inhalation capsule Place 1 capsule (18 mcg total) into inhaler and inhale daily. 30 capsule 12 02/15/2015 at Unknown time  . doxycycline (VIBRAMYCIN) 100 MG capsule Take 1 capsule (100 mg total) by mouth 2 (two) times daily. 20 capsule 0 Taking   Assessment: 59 y/o female with NSCLC who presents to the ED after fall. Pharmacy consulted to begin vancomycin for HCAP (Zosyn is also ordered). She received vancomycin 1000 mg IV 20:08 today. Tmax is 99.1, WBC are normal, and renal function is normal.  Goal of Therapy:  Vancomycin trough level 15-20 mcg/ml  Plan:  - Vancomycin 750 mg IV q12h - Monitor renal function, clinical progress   [facility], Pharm.D., BCPS Clinical Pharmacist Pager: [phone] 02/15/2015 10:30 PM

## 2015-02-15 NOTE — ED Notes (Signed)
Phlebotomy called to collect blood cultures.

## 2015-02-15 NOTE — ED Notes (Signed)
Attempted report.

## 2015-02-15 NOTE — ED Notes (Signed)
Dr. Billy Fischer at bedside.

## 2015-02-15 NOTE — ED Notes (Signed)
Elmyra Ricks, RN attempted report.

## 2015-02-15 NOTE — Progress Notes (Signed)
Report rec'd. Awaiting Patient's arrival.

## 2015-02-15 NOTE — ED Notes (Signed)
Pt reports 45 minutes PTA she was stepping down step from bathroom and left foot/leg gave out,laid on floor for 20 minutes before grandson got up to help her off floor.  Pain to left ankle, unable to bear full weight on left side.  Pt reports she has had increase in shortness of breath with rest.  Pt usually only short of breath with exertion.  Is on 4L of O2 via [place].  Pt reports being "shaky" today.  No chest pain.  Pt talking in complete sentences.

## 2015-02-15 NOTE — ED Notes (Signed)
Pt O2 sat dropped down to 86% on 4L. Pt O2 increased to 6L.

## 2015-02-15 NOTE — ED Provider Notes (Signed)
CSN: [id number]     Arrival date & time 02/15/15  1257 History   First MD Initiated Contact with Patient 02/15/15 1326     Chief Complaint  Patient presents with  . Shortness of Breath  . Ankle Injury     (Consider location/radiation/quality/duration/timing/severity/associated sxs/prior Treatment) HPI Comments: Woke this am with generalized weakness, shortness of breath Felt like foot gave out over step and fell down onto cement coverec by small amt of carpet No head trauma/or trauma to areas other than left ankle Left ankle pain severe Pt with SOB and generalized weakness preventing her from getting up for 20 minutes until she had help Radiation has changed appetite, decreased po intake No CP/no cough/no fever SOB always worse with laying down, no change with nebulizers Feels shaky today      Past Medical History  Diagnosis Date  . Coronary artery disease     S/P PCI of LAD with DES (12/2008). Total occlusion of RCA noted at that time., medically managed. ACS ruled out 03/2009 with Lexiscan myoview . Followed by [facility].  . Pulmonary hypertension ([place])     2-D Echo (86/7619) - Systolic pressure was moderately increased. PA peak pressure  50mHg. 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 Followed by Dr. WJoya Gaskins(Velora Heckler  . Diastolic dysfunction     2-D Echo (12/2008) - Normal LV Systolic funciton with EF 60-65%. Grade 1 diastolid dysfunction. No regional wall motion abnormalities. Moderate pulmonary HTN with PA peak pressure 576mg.  . Marland KitchenOPD (chronic obstructive pulmonary disease) (HCC)     Severe. Gold Stage IV.  PFTs (12/2008) - severe obstructive airway disease. Active tobacco use. Requires 4L O2 at home.  . Pulmonary nodule, right     Small right middle lobe nodule. Stable as of 12/2008.  . Marland Kitchenrediabetes 12/2008    HgbA1c 6.4 (12/2008)  . Hx MRSA infection     Recurrent  MRSA thigh abscesses.  . Tobacco abuse     Ongoing.  . Obesity   . Hyperlipidemia   . GERD (gastroesophageal reflux disease)     S/P Nissen fundoplication.  . CHF (congestive heart failure) ([facility]  . H/O right coronary artery stent placement 2010  . On home oxygen therapy since 2010    4L all the time  . Shortness of breath dyspnea     with a lot of exertion; if fluid builds up  . Pneumonia   . Depression   . Arthritis   . Cancer [place]    lung cancer  . Full dentures    Past Surgical History  Procedure Laterality Date  . Total abdominal hysterectomy w/ bilateral salpingoophorectomy    . Nissen fundoplication    . Right heart catheterization N/A 11/09/2012    Procedure: RIGHT HEART CATH;  Surgeon: DaLarey DresserMD;  [facility];  Service: Cardiovascular;  Laterality: N/A;  . Appendectomy    . Tonsillectomy    . [facility]. Breast surgery  25 years ago    Lumpectomy (biopsy negative)  . [facility]    . Knee cartilage surgery Right 10 years ago approx  . Tubal ligation  1979  . Video bronchoscopy with endobronchial ultrasound N/A 12/15/2014    Procedure: VIDEO BRONCHOSCOPY WITH ENDOBRONCHIAL ULTRASOUND;  Surgeon: PeTharon Aquas  Kerby Less, MD;  Location: [facility] OR;  Service: Thoracic;  Laterality: N/A;   Family History  Problem Relation Age of Onset  . Heart disease Mother 49    Deceased from MI at 77yo  . Hypertension Mother   . Heart disease Father 44    Deceased of MI age 69yo  . Hypertension Father   . Hypertension Brother   . Lung cancer      Grandmother   Social History  Substance Use Topics  . Smoking status: Current Every Day Smoker -- 0.25 packs/day for 45 years    Types: Cigarettes    Start date: 04/04/1969  . Smokeless tobacco: Never Used     Comment: 1/2 pack or less. 1 pack to .5 pack per day  . Alcohol Use: No   OB History    No data available     Review  of Systems  Constitutional: Positive for appetite change and fatigue. Negative for fever.  HENT: Negative for sore throat.   Eyes: Negative for visual disturbance.  Respiratory: Positive for shortness of breath. Negative for cough.   Cardiovascular: Negative for chest pain.  Gastrointestinal: Negative for vomiting, abdominal pain, diarrhea and constipation. Nausea: chronic from radiation.  Genitourinary: Negative for difficulty urinating.  Musculoskeletal: Positive for arthralgias. Negative for back pain and neck pain.  Skin: Negative for rash.  Neurological: Positive for weakness (generalized). Negative for syncope, speech difficulty, numbness and headaches.      Allergies  Fluconazole and Atorvastatin  Home Medications   Prior to Admission medications   Medication Sig Start Date End Date Taking? Authorizing Provider  acetaminophen (TYLENOL) 500 MG tablet Take 500 mg by mouth every 6 (six) hours as needed for moderate pain.    Yes Historical Provider, MD  albuterol (PROVENTIL) (2.5 MG/3ML) 0.083% nebulizer solution Take 3 mLs (2.5 mg total) by nebulization every 6 (six) hours as needed for shortness of breath. 09/05/14  Yes Oval Linsey, MD  albuterol (VENTOLIN HFA) 108 (90 BASE) MCG/ACT inhaler Inhale 2 puffs into the lungs every 6 (six) hours as needed for shortness of breath. 12/05/14  Yes Oval Linsey, MD  aspirin 81 MG chewable tablet Chew 1 tablet (81 mg total) by mouth at bedtime. 01/16/15  Yes Oval Linsey, MD  Fluticasone-Salmeterol (ADVAIR DISKUS) 250-50 MCG/DOSE AEPB Inhale 1 puff into the lungs 2 (two) times daily. 09/05/14  Yes Tasrif Ahmed, MD  furosemide (LASIX) 20 MG tablet Take 20 mg by mouth 2 (two) times daily as needed for fluid or edema.   Yes Historical Provider, MD  furosemide (LASIX) 40 MG tablet Take 1 tablet (40 mg total) by mouth daily as needed for fluid. 01/16/15  Yes Oval Linsey, MD  gabapentin (NEURONTIN) 300 MG capsule Take 1 capsule (300 mg total) by  mouth 3 (three) times daily. 01/16/15  Yes Oval Linsey, MD  lovastatin (MEVACOR) 40 MG tablet Take 1 tablet (40 mg total) by mouth at bedtime. 09/11/14 09/11/15 Yes Corky Sox, MD  Naproxen Sodium (ALEVE) 220 MG CAPS Take 220 mg by mouth 2 (two) times daily as needed (pain).   Yes Historical Provider, MD  pantoprazole (PROTONIX) 40 MG tablet Take 1 tablet (40 mg total) by mouth daily. 12/05/14  Yes Oval Linsey, MD  potassium chloride SA (K-DUR,KLOR-CON) 20 MEQ tablet Take 2 tablets (40 mEq total) by mouth daily as needed (Take when taking lasix). Patient taking differently: Take 20 mEq by mouth daily as needed (Take when taking lasix).  01/16/15  Yes Oval Linsey,  MD  tiotropium (SPIRIVA HANDIHALER) 18 MCG inhalation capsule Place 1 capsule (18 mcg total) into inhaler and inhale daily. 09/05/14  Yes Tasrif Ahmed, MD  doxycycline (VIBRAMYCIN) 100 MG capsule Take 1 capsule (100 mg total) by mouth 2 (two) times daily. 01/27/15   Tatyana Kirichenko, PA-C   BP 112/69 mmHg  Pulse 88  Temp(Src) 98.6 F (37 C) (Oral)  Resp 20  Ht _0  (1.651 m)  Wt 262 lb 9.1 oz (119.1 kg)  BMI 43.69 kg/m2  SpO2 90% Physical Exam  Constitutional: She is oriented to person, place, and time. She appears well-developed and well-nourished. No distress.  HENT:  Head: Normocephalic and atraumatic.  Eyes: Conjunctivae and EOM are normal.  Neck: Normal range of motion.  Cardiovascular: Normal rate, regular rhythm, normal heart sounds and intact distal pulses.  Exam reveals no gallop and no friction rub.   No murmur heard. Pulmonary/Chest: Effort normal and breath sounds normal. No respiratory distress. She has no wheezes. She has no rales.  Abdominal: Soft. She exhibits no distension. There is no tenderness. There is no guarding.  Musculoskeletal: She exhibits tenderness (left distal tibia, lateral and medial malleolus). She exhibits no edema.       Left knee: No tenderness found.       Right ankle: She exhibits  normal range of motion, no swelling, no ecchymosis and normal pulse.       Left ankle: She exhibits swelling. She exhibits normal range of motion, no ecchymosis, no deformity, no laceration and normal pulse. Tenderness. Lateral malleolus, medial malleolus and AITFL tenderness found. No proximal fibula tenderness found.       Cervical back: She exhibits no tenderness.  Neurological: She is alert and oriented to person, place, and time.  Skin: Skin is warm and dry. No rash noted. She is not diaphoretic. No erythema.  Nursing note and vitals reviewed.   ED Course  Procedures (including critical care time) Labs Review Labs Reviewed  BASIC METABOLIC PANEL - Abnormal; Notable for the following:    Potassium 3.2 (*)    Chloride 92 (*)    CO2 43 (*)    Glucose, Bld 176 (*)    BUN <5 (*)    Calcium 8.1 (*)    All other components within normal limits  CBC - Abnormal; Notable for the following:    MCV 106.6 (*)    MCHC 29.9 (*)    All other components within normal limits  D-DIMER, QUANTITATIVE (NOT AT [facility]) - Abnormal; Notable for the following:    D-Dimer, Quant 1.12 (*)    All other components within normal limits  BRAIN NATRIURETIC PEPTIDE - Abnormal; Notable for the following:    B Natriuretic Peptide 145.4 (*)    All other components within normal limits  MRSA PCR SCREENING  CULTURE, BLOOD (ROUTINE X 2)  CULTURE, BLOOD (ROUTINE X 2)  PROTIME-INR  COMPREHENSIVE METABOLIC PANEL  I-STAT TROPOININ, ED  I-STAT CG4 LACTIC ACID, ED    Imaging Review Dg Chest 2 View  02/15/2015  CLINICAL DATA:  Increased shortness of breath, left lower lobe mass EXAM: CHEST  2 VIEW COMPARISON:  01/27/2015 FINDINGS: Cardiomegaly evident with chronic vascular congestion. Large left lower lobe masslike opacity persists, better demonstrated on the CT comparison. Right lung remains clear. No superimposed edema or pneumothorax. Trachea midline. IMPRESSION: Cardiomegaly with vascular congestion Left lower lobe  masslike opacity, stable to minimal improvement by plain radiography. Patient is undergoing radiation. Electronically Signed   By: Jerilynn Mages.  Shick M.D.   On: 02/15/2015 14:40   Dg Tibia/fibula Left  02/15/2015  CLINICAL DATA:  Pt reports 45 minutes PTA she was stepping down step from bathroom and left foot/leg gave out,laid on floor for 20 minutes before grandson got up to help her off floor. Pain to left ankle, unable to bear full weight on left side. Pt reports she has had increase in shortness of breath with rest. Pt usually only short of breath with exertion EXAM: LEFT TIBIA AND FIBULA - 2 VIEW COMPARISON:  None. FINDINGS: No convincing fracture. No bone lesion. Knee and ankle joints are normally aligned. There is diffuse subcutaneous soft tissue edema throughout the leg most prominently involving the distal leg and ankle. IMPRESSION: No fracture or dislocation. Electronically Signed   By: Lajean Manes M.D.   On: 02/15/2015 14:37   Dg Ankle Complete Left  02/15/2015  CLINICAL DATA:  Fall when stepping down from bathroom with left ankle pain. Unable to bear weight. EXAM: LEFT ANKLE COMPLETE - 3+ VIEW COMPARISON:  07/01/2013 and 10/24/2011 FINDINGS: Moderate soft tissue swelling over the ankle. Ankle mortise is within normal. Findings suggesting an old distal fibular fracture. No acute fracture or dislocation. Minimal degenerative change over the midfoot/hindfoot. Spurring over the posterior and inferior calcaneus. IMPRESSION: No acute fracture. Electronically Signed   By: Marin Olp M.D.   On: 02/15/2015 14:37   Ct Angio Chest Pe W/cm &/or Wo Cm  02/15/2015  ADDENDUM REPORT: 02/15/2015 18:11 ADDENDUM: The previously noted left lower lobe lung mass has increased in size, from 5.5 cm to 6.3 cm. Surrounding new lower attenuation airspace consolidation most likely reflects postobstructive pneumonia. Electronically Signed   By: Garald Balding M.D.   On: 02/15/2015 18:11  02/15/2015  CLINICAL DATA:  History  of lung carcinoma with increasing shortness of Breath EXAM: CT ANGIOGRAPHY CHEST WITH CONTRAST TECHNIQUE: Multidetector CT imaging of the chest was performed using the standard protocol during bolus administration of intravenous contrast. Multiplanar CT image reconstructions and MIPs were obtained to evaluate the vascular anatomy. CONTRAST:  8m OMNIPAQUE IOHEXOL 350 MG/ML SOLN COMPARISON:  Chest x-ray from earlier in the same day FINDINGS: Lungs are well aerated bilaterally. Left lower lobe pneumonia is identified with dense consolidation. No other focal infiltrate is noted. No sizable parenchymal lesions are seen. The thoracic inlet is within normal limits. Calcific changes of the thoracic aorta are noted without aneurysmal dilatation or dissection. The pulmonary artery demonstrates a normal branching pattern. No filling defects to suggest pulmonary emboli are identified. No significant hilar or mediastinal adenopathy is noted. Upper abdomen and bony structures are within normal limits. Postsurgical changes are noted about the gastroesophageal junction. Review of the MIP images confirms the above findings. IMPRESSION: Left lower lobe pneumonia. No evidence of pulmonary embolism. Electronically Signed: By: MInez CatalinaM.D. On: 02/15/2015 17:03   I have personally reviewed and evaluated these images and lab results as part of my medical decision-making.   EKG Interpretation   Date/Time:  Sunday February 15 2015 13:06:05 EST Ventricular Rate:  107 PR Interval:  152 QRS Duration: 78 QT Interval:  344 QTC Calculation: 459 R Axis:   87 Text Interpretation:  Sinus tachycardia Nonspecific ST and T wave  abnormality Abnormal ECG Artifact, however ST abnormality in inferior  leads appears to be new from prior Confirmed by SThe [facility] Eye Surgical CenterMD, ERavenden ([id number] on 02/15/2015 1:32:13 PM      MDM   CRITICAL CARE: Hypotension Performed by: SAlvino Chapel  Total critical care time: 15  minutes  Critical care time was exclusive of separately billable procedures and treating other patients.  Critical care was necessary to treat or prevent imminent or life-threatening deterioration.  Critical care was time spent personally by me on the following activities: development of treatment plan with patient and/or surrogate as well as nursing, discussions with consultants, evaluation of patient's response to treatment, examination of patient, obtaining history from patient or surrogate, ordering and performing treatments and interventions, ordering and review of laboratory studies, ordering and review of radiographic studies, pulse oximetry and re-evaluation of patient's condition.   Final diagnoses:  HCAP (healthcare-associated pneumonia)  Left ankle sprain, initial encounter  Hypotension, unspecified hypotension type   59yo female with history of COPD on 4L of O2, chronic diastolic CHF, adenocarcinoma of left lung on radiation, CAD, pulmonary hypertension, presents with concern for shortness of breath, generalized weakness, and fall with left ankle pain.  Regarding fall, doubt cervical or intracranial head injury by history and physical exam, NEXUS/[place] CT criteria.  Only area of tenderness is of left ankle and distal tibia.  XR showed no sign of acute fx, and pt likely with ankle sprain which may be treated with aircast and crutches to use as needed.    Patient noted to be hypotensive on arrival to the ED to 86/60 (confirmed on manual BP). Rectal temperature showed pt afebrile, and by initial history, hypotension thought to be secondary to decreased po intake/dehydration or in setting of dyspnea, r/o PE. Marland Kitchen CXR not initially concerning for pneumonia.  DDimer ordered and positive and CTA PE study done given hypotension, SOB, generalized weakness, risk factors and pos ddimer which showed no sign of PE< however did show concerns for LLL pneumonia.  Blood cx ordered and pt given  vancomycin/zosyn.  She received 3L of NS with improvement in blood pressures, normal O2 on home O2, however given concern of hypotension, HCAP will admit to Internal Medicine for further care.     Gareth Morgan, MD 02/16/15 0005

## 2015-02-15 NOTE — H&P (Signed)
Date: 02/15/2015               Patient Name:  [name] MRN: [id number]  DOB: 1955-11-18 Age / Sex: 59 y.o., female   PCP: Oval Linsey, MD         Medical Service: Internal Medicine Teaching Service         Attending Physician: Dr. Aldine Contes, MD    First Contact: Dr. Zada Finders, MD Pager: [phone]  Second Contact: Dr. Dellia Nims, MD Pager: [phone]       After Hours (After 5p/  First Contact Pager: [phone]  weekends / holidays): Second Contact Pager: [phone]   Chief Complaint: Ankle Pain  History of Present Illness:   Ms. Katzman is a 59 year old woman with a past medical history of Stage 2 NSCLC (on radiation therapy), COPD (4L [place] at home, FEV1 42% pred, FEV1/FVC 88% 12/02/14), pulmonary HTN, CAD (DES 2010), CHF (EF 50-55%, g1dCHF), BPPV, and B12 Deficiency who presents with left ankle pain after a fall. This occurred in her home when she was stepping down from her toilet from using the bathroom. She uses an add-on bathroom in a soon-to-be condemned home that has multiple steps. She lost sense of where her foot was in space and believe she rolled her ankle while getting down. She reports he ankle pain as a severe aching feeling. She denied any salient antecedents such as vertigo, light-headedness, or heart palpitations. She denies hitting her head during the fall or losing consciousness. She reported feeling short of breath after the fall, which has since resolved. She has a chronic dry cough that is unchanged. Otherwise, she denies any fever, chills, night sweats, chest pain, nausea, vomiting, diarrhea, increased sputum production, hemoptysis, or blackouts.   She reports at least a couple falls in the past year with a similar description of her "foot giving out." She endorses episodic vertigo, but only when she lies on her back. 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 She was  started on Gabapentin 300 mg TID, but she has only been taking one pill at night due to daytime somnolence, and she addresses her "stabbing" sensation (separate from her new ankle pain) in the day with either Tylenol or Aleve, which is moderately effective. She also says her vision is very poor and it has been a while since she has updated her glasses prescription. She reports there her home is difficult to navigate and will be condemned within the month. She has access to a walker at home, but rarely uses it. She says she is first on the wait-list for an apartment. She continues to smoke a few cigarettes a day, has not tried nicotine replacement therapy before, and is not interested at the time. She does not drink.   With respect to management of her Stage 2 NSCLC, she is nearing the end of a 6 week course of radiation. She is scheduled to receive 4 treatments this week and her final two next week. She is not on chemotherapy, and is followed by oncologist Dr. Curt Bears.   In the ED, Tibia/fibula and ankle xrays on the left demonstrated no fracture or dislocation. She was noted to be tachycardic to 103, and in the setting of her known malignancy, and D-dimer was obtained which was elevated to 1.12. She continued to deny chest pain, but was on 6L [place], over her home 4L  O2 [place]. A CT angiogram was obtained which demonstrated no pulmonary embolism, but showed a left lower lobe pneumonia. She was briefly hypotensive to 86/60 which has since resolved, and was afebrile without a leukocytosis. Her lactic acid was 1.27. Her heart rate also normalized.   Meds: Current Facility-Administered Medications  Medication Dose Route Frequency Provider Last Rate Last Dose  . acetaminophen (TYLENOL) tablet 500 mg  500 mg Oral Q6H PRN Liberty Handy, MD   500 mg at 02/15/15 2251  . albuterol (PROVENTIL) (2.5 MG/3ML) 0.083% nebulizer solution 2.5 mg  2.5 mg Nebulization Q6H PRN Liberty Handy, MD      . aspirin chewable tablet 81  mg  81 mg Oral QHS Liberty Handy, MD   81 mg at 02/15/15 2258  . [START ON 02/16/2015] enoxaparin (LOVENOX) injection 40 mg  40 mg Subcutaneous Q24H Liberty Handy, MD      . Derrill Memo ON 02/16/2015] furosemide (LASIX) tablet 40 mg  40 mg Oral Daily Liberty Handy, MD      . gabapentin (NEURONTIN) capsule 300 mg  300 mg Oral TID Liberty Handy, MD   300 mg at 02/15/15 2257  . mometasone-formoterol (DULERA) 100-5 MCG/ACT inhaler 2 puff  2 puff Inhalation BID Liberty Handy, MD      . Derrill Memo ON 02/16/2015] naproxen (NAPROSYN) tablet 500 mg  500 mg Oral BID WC Liberty Handy, MD      . Derrill Memo ON 02/16/2015] pantoprazole (PROTONIX) EC tablet 40 mg  40 mg Oral Daily Liberty Handy, MD      . Derrill Memo ON 02/16/2015] piperacillin-tazobactam (ZOSYN) IVPB 3.375 g  3.375 g Intravenous 3 times per day Aldine Contes, MD      . Derrill Memo ON 02/16/2015] pravastatin (PRAVACHOL) tablet 40 mg  40 mg Oral q1800 Liberty Handy, MD      . sodium chloride 0.9 % injection 3 mL  3 mL Intravenous Q12H Liberty Handy, MD   3 mL at 02/15/15 2258  . [START ON 02/16/2015] tiotropium (SPIRIVA) inhalation capsule 18 mcg  18 mcg Inhalation Daily Liberty Handy, MD      . Derrill Memo ON 02/16/2015] vancomycin (VANCOCIN) IVPB 750 mg/150 ml premix  750 mg Intravenous Q12H [place]        Allergies: Allergies as of 02/15/2015 - Review Complete 02/15/2015  Allergen Reaction Noted  . Fluconazole Anaphylaxis, Itching, and Swelling   . Atorvastatin Other (See Comments) 09/09/2014   Past Medical History  Diagnosis Date  . Coronary artery disease     S/P PCI of LAD with DES (12/2008). Total occlusion of RCA noted at that time., medically managed. ACS ruled out 03/2009 with Lexiscan myoview . Followed by [place].  . Pulmonary hypertension ([place])     2-D Echo (88/3374) - Systolic pressure was moderately increased. PA peak pressure  30mHg. secondary pulm htn likely on basis of comb of interstital lung disease, severe copd, small airways disease, severe sleep  apnea and cor pulmonale,. Followed by Dr. WJoya Gaskins(Vel[facility] Heckler  . Diastolic dysfunction     2-D Echo (12/2008) - Normal LV Systolic funciton with EF 60-65%. Grade 1 diastolid dysfunction. No regional wall motion abnormalities. Moderate pulmonary HTN with PA peak pressure 526mg.  . Marland KitchenOPD (chronic obstructive pulmonary disease) (HCC)     Severe. Gold Stage IV.  PFTs (12/2008) - severe obstructive airway disease. Active tobacco use. Requires 4L O2 at home.  . Pulmonary nodule, right     Small right middle lobe nodule. Stable as of 12/2008.  . Marland Kitchenrediabetes  12/2008    HgbA1c 6.4 (12/2008)  . Hx MRSA infection     Recurrent MRSA thigh abscesses.  . Tobacco abuse     Ongoing.  . Obesity   . Hyperlipidemia   . GERD (gastroesophageal reflux disease)     S/P Nissen fundoplication.  . CHF (congestive heart failure) ([place])   . H/O right coronary artery stent placement 2010  . On home oxygen therapy since 2010    4L all the time  . Shortness of breath dyspnea     with a lot of exertion; if fluid builds up  . Pneumonia   . Depression   . Arthritis   . Cancer ([place])     lung cancer  . Full dentures    Past Surgical History  Procedure Laterality Date  . Total abdominal hysterectomy w/ bilateral salpingoophorectomy    . Nissen fundoplication    . Right heart catheterization N/A 11/09/2012    Procedure: RIGHT HEART CATH;  Surgeon: Larey Dresser, MD;  Location: [facility] CATH LAB;  Service: Cardiovascular;  Laterality: N/A;  . Appendectomy    . Tonsillectomy    . Back surgery  [facility], Dr. Lorin Mercy  . Breast surgery  25 years ago    Lumpectomy (biopsy negative)  . Cardiac catheterization  2010    [facility]  . Abdominal hysterectomy    . Knee cartilage surgery Right 10 years ago approx  . Tubal ligation  1979  . Video bronchoscopy with endobronchial ultrasound N/A 12/15/2014    Procedure: VIDEO BRONCHOSCOPY WITH ENDOBRONCHIAL ULTRASOUND;  Surgeon: Ivin Poot, MD;  [facility]: [facility] OR;  Service:  Thoracic;  Laterality: N/A;   Family History  Problem Relation Age of Onset  . Heart disease Mother 11    Deceased from MI at 77yo  . Hypertension Mother   . Heart disease Father 89    Deceased of MI age 61yo  . Hypertension Father   . Hypertension Brother   . Lung cancer      Grandmother   Social History   Social History  . Marital Status: Single    Spouse Name: N/A  . Number of Children: N/A  . Years of Education: N/A   Occupational History  . Not on file.   Social History Main Topics  . Smoking status: Current Every Day Smoker -- 0.25 packs/day for 45 years    Types: Cigarettes    Start date: 04/04/1969  . Smokeless tobacco: Never Used     Comment: 1/2 pack or less. 1 pack to .5 pack per day  . Alcohol Use: No  . Drug Use: No  . Sexual Activity: No   Other Topics Concern  . Not on file   Social History Narrative   Formerly worked as a Scientist, water quality, now disabled.   Divorced.   2 grown children.   Lives with her grandson.    Review of Systems: Negative except per HPI.  Physical Exam: Blood pressure 102/76, pulse 100, temperature 99.1 F (37.3 C), temperature source Oral, resp. rate 20, height _0  (1.651 m), weight 262 lb 9.1 oz (119.1 kg), SpO2 94 %. General: Obese woman, sitting in bed, in no acute distress HEENT: Head atraumatic, Moist mucous membranes, No tonsillar erythema or exudates, EOMI, PERRL, sclerae anicteric Cardiovascular: RRR no m/r/g Pulmonary: Clear to ausculation bilaterally. No crackles or wheezes. Abdomen: Normal bowel sounds, soft, non-tender, non-distended MSK: Left lateral malleolus tender to palpation with range of motion limited by pain.  Extremities: 2+ pitting edema noted bilaterally. 1+ symmetric DP pulses. No cyanosis or clubbing. Neurological: Tongue midline, face symmetric. Symmetric sensation to light touch in lower extremities. Proprioception in RLE normal, unable to assess in LLE due to pain. Unable to assess strength in LLE  since limited by pain, but able to move LLE without resistance. Other extremities 5/5 strength. Normal FTN.   Lab results: Basic Metabolic Panel:  Recent Labs  02/15/15 1345  NA 140  K 3.2*  CL 92*  CO2 43*  GLUCOSE 176*  BUN <5*  CREATININE 0.75  CALCIUM 8.1*   CBC:  Recent Labs  02/15/15 1345  WBC 7.7  HGB 13.0  HCT 43.5  MCV 106.6*  PLT 179   D-Dimer:  Recent Labs  02/15/15 1345  DDIMER 1.12*   Coagulation:  Recent Labs  02/15/15 1345  LABPROT 13.6  INR 1.02    Imaging results:  Dg Chest 2 View  02/15/2015  CLINICAL DATA:  Increased shortness of breath, left lower lobe mass EXAM: CHEST  2 VIEW COMPARISON:  01/27/2015 FINDINGS: Cardiomegaly evident with chronic vascular congestion. Large left lower lobe masslike opacity persists, better demonstrated on the CT comparison. Right lung remains clear. No superimposed edema or pneumothorax. Trachea midline. IMPRESSION: Cardiomegaly with vascular congestion Left lower lobe masslike opacity, stable to minimal improvement by plain radiography. Patient is undergoing radiation. Electronically Signed   By: Jerilynn Mages.  Shick M.D.   On: 02/15/2015 14:40   Dg Tibia/fibula Left  02/15/2015  CLINICAL DATA:  Pt reports 45 minutes PTA she was stepping down step from bathroom and left foot/leg gave out,laid on floor for 20 minutes before grandson got up to help her off floor. Pain to left ankle, unable to bear full weight on left side. Pt reports she has had increase in shortness of breath with rest. Pt usually only short of breath with exertion EXAM: LEFT TIBIA AND FIBULA - 2 VIEW COMPARISON:  None. FINDINGS: No convincing fracture. No bone lesion. Knee and ankle joints are normally aligned. There is diffuse subcutaneous soft tissue edema throughout the leg most prominently involving the distal leg and ankle. IMPRESSION: No fracture or dislocation. Electronically Signed   By: Lajean Manes M.D.   On: 02/15/2015 14:37   Dg Ankle Complete  Left  02/15/2015  CLINICAL DATA:  Fall when stepping down from bathroom with left ankle pain. Unable to bear weight. EXAM: LEFT ANKLE COMPLETE - 3+ VIEW COMPARISON:  07/01/2013 and 10/24/2011 FINDINGS: Moderate soft tissue swelling over the ankle. Ankle mortise is within normal. Findings suggesting an old distal fibular fracture. No acute fracture or dislocation. Minimal degenerative change over the midfoot/hindfoot. Spurring over the posterior and inferior calcaneus. IMPRESSION: No acute fracture. Electronically Signed   By: Marin Olp M.D.   On: 02/15/2015 14:37   Ct Angio Chest Pe W/cm &/or Wo Cm  02/15/2015  ADDENDUM REPORT: 02/15/2015 18:11 ADDENDUM: The previously noted left lower lobe lung mass has increased in size, from 5.5 cm to 6.3 cm. Surrounding new lower attenuation airspace consolidation most likely reflects postobstructive pneumonia. Electronically Signed   By: Garald Balding M.D.   On: 02/15/2015 18:11  02/15/2015  CLINICAL DATA:  History of lung carcinoma with increasing shortness of Breath EXAM: CT ANGIOGRAPHY CHEST WITH CONTRAST TECHNIQUE: Multidetector CT imaging of the chest was performed using the standard protocol during bolus administration of intravenous contrast. Multiplanar CT image reconstructions and MIPs were obtained to evaluate the vascular anatomy. CONTRAST:  12m OMNIPAQUE IOHEXOL 350  MG/ML SOLN COMPARISON:  Chest x-ray from earlier in the same day FINDINGS: Lungs are well aerated bilaterally. Left lower lobe pneumonia is identified with dense consolidation. No other focal infiltrate is noted. No sizable parenchymal lesions are seen. The thoracic inlet is within normal limits. Calcific changes of the thoracic aorta are noted without aneurysmal dilatation or dissection. The pulmonary artery demonstrates a normal branching pattern. No filling defects to suggest pulmonary emboli are identified. No significant hilar or mediastinal adenopathy is noted. Upper abdomen and bony  structures are within normal limits. Postsurgical changes are noted about the gastroesophageal junction. Review of the MIP images confirms the above findings. IMPRESSION: Left lower lobe pneumonia. No evidence of pulmonary embolism. Electronically Signed: By: Inez Catalina M.D. On: 02/15/2015 17:03    EKG:  Ventricular Rate: 107 PR Interval: 152 QRS Duration: 78 QT Interval: 344 QTC Calculation: 459 R Axis: 87 Text Interpretation: Sinus tachycardia Nonspecific ST and T wave  abnormality Abnormal ECG Artifact, however ST abnormality in inferior  leads appears to be new from prior  Assessment & Plan by Problem:  Hospital-Acquired Pneumonia: Left lower lobe pneumonia noted on CT scan, but appears to not have any acute symptoms over her baseline. She lacks fever, leukocytosis, and I could not appreciate any salient pulmonary exam findings. Nonetheless, we will treat her as a hospital-acquired pneumonia given her admission on 12/20/14. Other considerations are radiation-induced pneumonitis and post-obstructive pneumonia. Her O2 requirements have not changed from baseline, she denies increased sputum production - therefore, I do not suspect a concomitant COPD exacerbation at this time. - Vancomycin IV per pharm - Zosyn 3.375 g IV q8h - Sputum culture pending, Blood culture pending - Consider to Levaquin based on cultures to complete a 7 day course - Clarification from radiology on how this is differentiated from radiation pneumonitis   Ankle Strain secondary to Fall secondary to B12 Neuropathy: In setting of known B12 deficiency and lack of clear antecedents, this is likely due to a loss of proprioception and environmental barriers in the home. Her known BPPV does not appear to have played a role in this episode. 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 No evidence of fracture  on imaging or exam, likely ligamentous strain. - Naproxen 500 mg BID prn pain, Acetaminophen 500 mg q6h prn - Ice packs - Continue home Gabapentin 300 mg TID, continue switching to amitriptyline or other alternative on discharge given daytime somnolence - B12 injection in AM  COPD: On regimen of Advair, albuterol inhaler and nebulizer, and spiriva at home. PFTs performed this year appear improved from previous in 2010.  - Albuterol 2.5 mg nebulizer q6h prn, Spriva 18 mcg daily, Dulera 2 puffs BID - 4LNC, with SpO2 27-74%  Diastolic CHF: Edema noted on exam. Takes furosemide 40 mg as needed at home - Furosemide 40 mg daily, watch for improvement in edema and discontinue  Hypokalemia: 3.2. Takes potassium tablets only when she take furosemide at home. - 40 mEq K-dur once - BMET in AM  GERD: S/p fundoplication. Currently asymptomatic - Protonix 40 mg daily  CAD: ASA 81 mg daily. Pravastatin 40 mg daily.  HLD: ASA 81 mg daily. Pravastatin 40 mg daily.  Depression versus Adjustment disorder: Reported low mood at previous clinic visits with discussions to start pharmacotherapy. She may benefit from an SNRI or TCA that could concurrently address both her neuropathy and mood  symptoms.  DVT Prophylaxis: Lovenox [place] Admit to: Stepdown Code Status: Full Diet: Heart healthy  Dispo: Disposition is deferred at this time, awaiting improvement of current medical problems. Anticipated discharge in approximately 3-4 day(s).   The patient does have a current PCP Oval Linsey, MD) and does need an [facility] hospital follow-up appointment after discharge.  The patient does have transportation limitations that hinder transportation to clinic appointments.  Signed: Liberty Handy, MD 02/15/2015, 9:48 PM

## 2015-02-16 ENCOUNTER — Ambulatory Visit: Payer: Commercial Managed Care - HMO

## 2015-02-16 ENCOUNTER — Ambulatory Visit
Admit: 2015-02-16 | Discharge: 2015-02-16 | Disposition: A | Discharge status: Home / Self Care | Payer: Commercial Managed Care - HMO | Attending: Radiation Oncology | Admitting: Radiation Oncology

## 2015-02-16 LAB — COMPREHENSIVE METABOLIC PANEL
ALBUMIN: 2.1 g/dL — AB (ref 3.5–5.0)
ALK PHOS: 177 U/L — AB (ref 38–126)
ALT: 12 U/L — AB (ref 14–54)
ANION GAP: 4 — AB (ref 5–15)
AST: 13 U/L — ABNORMAL LOW (ref 15–41)
BILIRUBIN TOTAL: 0.6 mg/dL (ref 0.3–1.2)
BUN: <5 mg/dL — ABNORMAL LOW (ref 6–20)
CALCIUM: 8 mg/dL — AB (ref 8.9–10.3)
CO2: 38 mmol/L — AB (ref 22–32)
CREATININE: 0.67 mg/dL (ref 0.44–1.00)
Chloride: 98 mmol/L — ABNORMAL LOW (ref 101–111)
GFR calc Af Amer: >60 mL/min (ref >60)
GFR calc non Af Amer: >60 mL/min (ref >60)
GLUCOSE: 122 mg/dL — AB (ref 65–99)
Potassium: 3.8 mmol/L (ref 3.5–5.1)
Sodium: 140 mmol/L (ref 135–145)
TOTAL PROTEIN: 5.2 g/dL — AB (ref 6.5–8.1)

## 2015-02-16 MED ORDER — ENOXAPARIN SODIUM 60 MG/0.6ML ~~LOC~~ SOLN
60.0000 mg | SUBCUTANEOUS | Status: DC
  Administered 2015-02-16 – 2015-02-18 (×3): 60 mg via SUBCUTANEOUS
  Filled 2015-02-16 (×3): qty 0.6

## 2015-02-16 MED ORDER — KETOROLAC TROMETHAMINE 30 MG/ML IJ SOLN
30.0000 mg | Freq: Once | INTRAMUSCULAR | Status: AC
  Administered 2015-02-16: 30 mg via INTRAVENOUS
  Filled 2015-02-16: qty 1

## 2015-02-16 MED ORDER — LEVOFLOXACIN 750 MG PO TABS
750.0000 mg | ORAL_TABLET | Freq: Every day | ORAL | Status: DC
  Administered 2015-02-16 – 2015-02-18 (×3): 750 mg via ORAL
  Filled 2015-02-16 (×3): qty 1

## 2015-02-16 NOTE — Clinical Social Work Placement (Signed)
CLINICAL SOCIAL WORK PLACEMENT  NOTE  Date:  02/16/2015  Patient Details  Name: [name] MRN: [id number] Date of Birth: 1955-06-27  Clinical Social Work is seeking post-discharge placement for this patient at the [place] level of care (*CSW will initial, date and re-position this form in  chart as items are completed):  Yes   Patient/family provided with [facility] Work Department's list of facilities offering this level of care within the geographic area requested by the patient (or if unable, by the patient's family).  Yes   Patient/family informed of their freedom to choose among providers that offer the needed level of care, that participate in Medicare, Medicaid or managed care program needed by the patient, have an available bed and are willing to accept the patient.  Yes   Patient/family informed of [employer]'s ownership interest in [facility] and [facility], as well as of the fact that they are under no obligation to receive care at these facilities.  PASRR submitted to EDS on       PASRR number received on 02/16/15     Existing PASRR number confirmed on       FL2 transmitted to all facilities in geographic area requested by pt/family on 02/16/15     FL2 transmitted to all facilities within larger geographic area on       Patient informed that his/her managed care company has contracts with or will negotiate with certain facilities, including the following:            Patient/family informed of bed offers received.  Patient chooses bed at       Physician recommends and patient chooses bed at      Patient to be transferred to   on  .  Patient to be transferred to facility by       Patient family notified on   of transfer.  Name of family member notified:        PHYSICIAN Please sign FL2     Additional Comment:    _______________________________________________ Raynelle Highland BSW Intern, [phone]

## 2015-02-16 NOTE — NC FL2 (Signed)
[place] LEVEL OF CARE SCREENING TOOL     IDENTIFICATION  Patient Name: [name] Birthdate: 1955/11/06 Sex: female Admission Date (Current Location): 02/15/2015  [facility] and [place] Number: Herbalist and Address:  [facility] [place]. [facility] [place] [address], [place], [place] [address]      Provider Number: [id number]  Attending Physician Name and Address:  Aldine Contes, MD  Relative Name and Phone Number:  Renato Shin [phone]) Nolon Nations [phone])    Current Level of Care: Hospital Recommended Level of Care: [place] Prior Approval Number:    Date Approved/Denied:   PASRR Number:  [id number]  Discharge Plan: SNF    Current Diagnoses: Patient Active Problem List   Diagnosis Date Noted  . Pneumonia 02/15/2015  . HCAP (healthcare-associated pneumonia) 02/15/2015  . Sprain of left ankle 02/15/2015  . Fall at home 02/15/2015  . Hypokalemia 02/15/2015  . Major depression ([place]) 01/16/2015  . Senile purpura ([place]) 01/16/2015  . Vitamin B12 deficiency neuropathy 12/21/2014  . Hypercapnic respiratory failure, chronic ([place]) 12/20/2014  . Primary adenocarcinoma of left lung ([place]) 12/04/2014  . Preventative health care 02/06/2013  . Chronic diastolic CHF (congestive heart failure) ([place]) 11/05/2012  . Hyperlipidemia   . Urge incontinence 06/15/2011  . Financial difficulties 06/15/2010  . Pulmonary hypertension ([place]) 01/05/2009  . Body mass index (BMI) 40.0-44.9, adult ([place]) 12/31/2008  . Sleep apnea 12/31/2008  . Tobacco abuse 12/22/2008  . Coronary atherosclerosis 12/22/2008  . Severe chronic obstructive pulmonary disease ([place]) 12/22/2008  . Gastroesophageal reflux disease 12/22/2008  . Prediabetes 12/22/2008    Orientation ACTIVITIES/SOCIAL BLADDER RESPIRATION    Self, Time, Situation, Place  Active Continent O2 (As needed) (6L)  BEHAVIORAL SYMPTOMS/MOOD NEUROLOGICAL BOWEL NUTRITION STATUS       Continent Diet (Heart Healthy)  PHYSICIAN VISITS COMMUNICATION OF NEEDS Height & Weight Skin    Verbally _0  (165.1 cm) 262 lbs. Normal          AMBULATORY STATUS RESPIRATION    Supervision limited O2 (As needed) (6L)      Personal Care Assistance Level of Assistance   (Limited Assistance)            Functional Limitations Info                SPECIAL CARE FACTORS FREQUENCY  PT (By licensed PT)     PT Frequency: 5x/week             Additional Factors Info  Code Status, Allergies Code Status Info: FULL CODE Allergies Info: Fluconazole, Atorvastatin           Current Medications (02/16/2015): Current Facility-Administered Medications  Medication Dose Route Frequency Provider Last Rate Last Dose  . acetaminophen (TYLENOL) tablet 500 mg  500 mg Oral Q6H PRN Liberty Handy, MD   500 mg at 02/15/15 2251  . albuterol (PROVENTIL) (2.5 MG/3ML) 0.083% nebulizer solution 2.5 mg  2.5 mg Nebulization Q6H PRN Liberty Handy, MD      . aspirin chewable tablet 81 mg  81 mg Oral QHS Liberty Handy, MD   81 mg at 02/15/15 2258  . enoxaparin (LOVENOX) injection 60 mg  60 mg Subcutaneous Q24H Kendra P Hiatt, RPH   60 mg at 02/16/15 1103  . furosemide (LASIX) tablet 40 mg  40 mg Oral Daily Liberty Handy, MD   40 mg at 02/16/15 1052  . gabapentin (NEURONTIN) capsule 300 mg  300 mg Oral TID Liberty Handy, MD  300 mg at 02/16/15 1052  . levofloxacin (LEVAQUIN) tablet 750 mg  750 mg Oral Daily Tasrif Ahmed, MD   750 mg at 02/16/15 1103  . mometasone-formoterol (DULERA) 100-5 MCG/ACT inhaler 2 puff  2 puff Inhalation BID Liberty Handy, MD   2 puff at 02/16/15 0820  . naproxen (NAPROSYN) tablet 500 mg  500 mg Oral BID PRN Liberty Handy, MD      . pantoprazole (PROTONIX) EC tablet 40 mg  40 mg Oral Daily Liberty Handy, MD   40 mg at 02/16/15 1052  . pravastatin (PRAVACHOL) tablet 40 mg  40 mg Oral q1800 Liberty Handy, MD      . sodium chloride 0.9 % injection 3 mL  3 mL Intravenous Q12H Liberty Handy, MD   3 mL at 02/16/15 1104  . tiotropium (SPIRIVA) inhalation capsule 18 mcg  18 mcg Inhalation Daily Liberty Handy, MD   18 mcg at 02/16/15 9211   Do not use this list as official medication orders. Please verify with discharge summary.  Discharge Medications:   Medication List    ASK your doctor about these medications        acetaminophen 500 MG tablet  Commonly known as:  TYLENOL  Take 500 mg by mouth every 6 (six) hours as needed for moderate pain.     albuterol (2.5 MG/3ML) 0.083% nebulizer solution  Commonly known as:  PROVENTIL  Take 3 mLs (2.5 mg total) by nebulization every 6 (six) hours as needed for shortness of breath.     albuterol 108 (90 BASE) MCG/ACT inhaler  Commonly known as:  VENTOLIN HFA  Inhale 2 puffs into the lungs every 6 (six) hours as needed for shortness of breath.     ALEVE 220 MG Caps  Generic drug:  Naproxen Sodium  Take 220 mg by mouth 2 (two) times daily as needed (pain).     aspirin 81 MG chewable tablet  Chew 1 tablet (81 mg total) by mouth at bedtime.     doxycycline 100 MG capsule  Commonly known as:  VIBRAMYCIN  Take 1 capsule (100 mg total) by mouth 2 (two) times daily.     Fluticasone-Salmeterol 250-50 MCG/DOSE Aepb  Commonly known as:  ADVAIR DISKUS  Inhale 1 puff into the lungs 2 (two) times daily.     furosemide 20 MG tablet  Commonly known as:  LASIX  Take 20 mg by mouth 2 (two) times daily as needed for fluid or edema.     furosemide 40 MG tablet  Commonly known as:  LASIX  Take 1 tablet (40 mg total) by mouth daily as needed for fluid.     gabapentin 300 MG capsule  Commonly known as:  NEURONTIN  Take 1 capsule (300 mg total) by mouth 3 (three) times daily.     lovastatin 40 MG tablet  Commonly known as:  MEVACOR  Take 1 tablet (40 mg total) by mouth at bedtime.     pantoprazole 40 MG tablet  Commonly known as:  PROTONIX  Take 1 tablet (40 mg total) by mouth daily.     potassium chloride SA 20 MEQ tablet   Commonly known as:  K-DUR,KLOR-CON  Take 2 tablets (40 mEq total) by mouth daily as needed (Take when taking lasix).     tiotropium 18 MCG inhalation capsule  Commonly known as:  SPIRIVA HANDIHALER  Place 1 capsule (18 mcg total) into inhaler and inhale daily.        Relevant Imaging Results:  Relevant Lab Results:  Recent Labs    Additional Information SS# [id number]   Raynelle Highland BSW Intern, [phone]  Glendon Axe, MSW, LCSWA [phone] 02/16/2015 2:05 PM

## 2015-02-16 NOTE — Progress Notes (Signed)
Utilization Review Completed.  

## 2015-02-16 NOTE — Clinical Social Work Note (Signed)
Clinical Social Work Assessment  Patient Details  Name: [name] MRN: [id number] Date of Birth: 02/02/1956  Date of referral:  02/16/15               Reason for consult:  Facility Placement                Permission sought to share information with:  Chartered certified accountant granted to share information::  Yes, Advertising account executive::  SNF   Housing/Transportation Living arrangements for the past 2 months:  [place] of Information:  Patient Patient Interpreter Needed:  None Criminal Activity/Legal Involvement Pertinent to Current Situation/Hospitalization:  No - Comment as needed Significant Relationships:  Other Family Members Lives with:  Self, Roommate, Relatives (Grandson and Roomates) Do you feel safe going back to the place where you live?  Yes Need for family participation in patient care:  Yes (Comment)  Care giving concerns:  Patient is currently in chemo therapy and is very concerned about missing treatments.    Social Worker assessment / plan:  BSW intern met with patient at bedside to complete assessment. Patient stated that she was admitted to the hospital yesterday from a fall. Patient stated that patient roommate brought her to the hospital. BSW intern asked patient where she lived prior to her hospitalization. Patient states that she lives in a home with her 60 year old grandson, a roommate and her husband, along with the roommate's son. Patient stated that she is in the final stretch of chemo therapies at [facility]. Patient states that she has missed one treatment because of hospitalization and that concerned her the most. Patient was very driven to complete her chemo therapies because she only had six treatments remaining (four treatments this week, and two next week).  BSW intern asked patient if she would be willing to go to a skilled nursing facility at discharge. Patient initially refused because she  did not want to miss anymore chemo treatments and she could not pay for the facility expenses. Patient also stated that she did not want to live in a nursing home and she was worried about leaving her grandson for a long period of time. BSW intern explained to the patient that a SNF was different than a nursing home and that SNF was suggested for short term rehab to regain strength in her legs and feet. Patient asked BSW intern if paitnet would be forced to go to a skilled nursing facility if BSW intern sent out patient information to SNFs. BSW intern explained that sending out patient information to SNFs did not mean patient would be required to go to SNF, but rather SNF could be an option if patient chooses to do so. BSW intern aslo explained to patient that the facility could transfer patient to and from [facility] for chemo treatments and insurance would potentially pay 100% of SNF for the first 20 days. Once patient understood this, patient was more agreeable, but was still not sure if she wanted to go to a SNF or not. BSW intern explained the SNF process and told the patient that a decision was not needed immediately. BSW intern also explained that a Education officer, museum would return with bed offers. BSW intern will continue to follow and assist as needed.  Employment status:  Disabled (Comment on whether or not currently receiving Disability) Insurance information:  Managed Medicare PT Recommendations:  [place] / Referral to community  resources:  [place]  Patient/Family's Response to care:  Patient seemed to be content with care provided.  Patient/Family's Understanding of and Emotional Response to Diagnosis, Current Treatment, and Prognosis:  Patient seemed to be anxious and concerned about finishing her cancer treatments. Patient initially did not want SNF, but after further explanation, patient seemed to be more agreeable to SNF. Patient  understands current treatment and post discharge needs.  Emotional Assessment Appearance:  Appears stated age Attitude/Demeanor/Rapport:  Guarded, Apprehensive Affect (typically observed):  Appropriate, Flat, Frustrated Orientation:  Oriented to Self, Oriented to Place, Oriented to  Time, Oriented to Situation Alcohol / Substance use:  Not Applicable Psych involvement (Current and /or in the community):  No (Comment)  Discharge Needs  Concerns to be addressed:  Discharge Planning Concerns Readmission within the last 30 days:  No Current discharge risk:  None Barriers to Discharge:  Continued Medical Work up    [employer], [phone]

## 2015-02-16 NOTE — Evaluation (Signed)
Physical Therapy Evaluation Patient Details Name: [name] MRN: [id number] DOB: Dec 31, 1955 Today's Date: 02/16/2015   History of Present Illness  Ms. Napolitano is a 59 year old woman with a past medical history of Stage 2 NSCLC (on radiation therapy), COPD (4L [place] at home, FEV1 42% pred, FEV1/FVC 88% 12/02/14), pulmonary HTN, CAD (DES 2010), CHF (EF 50-55%, g1dCHF), BPPV, and B12 Deficiency who presents with left ankle pain after a fall. This occurred in her home when she was stepping down from her toilet from using the bathroom. She uses an add-on bathroom in a soon-to-be condemned home that has multiple steps. She lost sense of where her foot was in space and believe she rolled her ankle while getting down. She reports he ankle pain as a severe aching feeling. She denied any salient antecedents such as vertigo, light-headedness, or heart palpitations. She denies hitting her head during the fall or losing consciousness. She reported feeling short of breath after the fall, which has since resolved.  Clinical Impression  Pt admitted with above diagnosis. Pt currently with functional limitations due to the deficits listed below (see PT Problem List). Pt having a lot of difficulty with stand pivot transfers.  Pt states "If I had a cast shoe, it would help."  Pt barely able to lift bottom off 3N1 to get to bed.  Will benefit from SNF for therapy.  Will follow acutely.   Desat to 81% on 4L and had to incr O2 to 6LO2.   Pt will benefit from skilled PT to increase their independence and safety with mobility to allow discharge to the venue listed below.      Follow Up Recommendations SNF;Supervision/Assistance - 24 hour    Equipment Recommendations  Other (comment);3in1 (PT) (TBA)    Recommendations for Other Services       Precautions / Restrictions Precautions Precautions: Fall Restrictions Weight Bearing Restrictions: No      Mobility  Bed Mobility Overal bed mobility: Needs Assistance Bed  Mobility: Sit to Supine       Sit to supine: Mod assist   General bed mobility comments: Needed assist for LEs into bed.   Transfers Overall transfer level: Needs assistance Equipment used: None Transfers: Sit to/from [facility] Sit to Stand: Max assist;+2 physical assistance Stand pivot transfers: Max assist;+2 physical assistance       General transfer comment: Pt had difficulty powering up from 3N1.  Needed max assist and barely cleared bottom enough to move with 3N1 to bed.  Not bearing weight on left LE due to pain.   Ambulation/Gait                Stairs            Wheelchair Mobility    Modified Rankin (Stroke Patients Only)       Balance Overall balance assessment: Needs assistance;History of Falls Sitting-balance support: No upper extremity supported;Feet supported Sitting balance-Leahy Scale: Fair Sitting balance - Comments: Pt able to sit EOB without support and can reach side to side.    Standing balance support: Bilateral upper extremity supported;During functional activity Standing balance-Leahy Scale: Zero Standing balance comment: unable to achieve full upright stance even with max assist.                              Pertinent Vitals/Pain Pain Assessment: Faces Faces Pain Scale: Hurts whole lot Pain Location: left ankle Pain Descriptors / Indicators:  Aching;Sore Pain Intervention(s): Limited activity within patient's tolerance;Monitored during session;Repositioned  O2 >90% on 4L on arrival.  Desat to 81% on 4LO2 after transfer.  Had to incr sats to 6LO2 to incr sats back to >90%.  LEft pt on 6LO2 with nursing aware.  BP 108/64.      Home Living Family/patient expects to be discharged to:: Private residence Living Arrangements: Other relatives (2 year old grandson) Available Help at Discharge: Family;Available 24 hours/day Type of Home: House Home Access: Stairs to enter Entrance Stairs-Rails:  None Entrance Stairs-Number of Steps: 1 + 1 Home Layout: Multi-level Home Equipment: Cane - single point;Crutches;Walker - 2 wheels;Shower seat;Wheelchair - manual (home O2 4L, wheelchair is roommates, ) Additional Comments: Pt reports roommate can assist some.  Friends drive her to chemo.    Prior Function Level of Independence: Independent         Comments: Per chart lives in soon to be condemned home with add on bathroom which is where pt fell.      Hand Dominance        Extremity/Trunk Assessment   Upper Extremity Assessment: Defer to OT evaluation           Lower Extremity Assessment: RLE deficits/detail;LLE deficits/detail RLE Deficits / Details: grossly 2/5 LLE Deficits / Details: grossly 2/5  Cervical / Trunk Assessment: Normal  Communication   Communication: No difficulties  Cognition Arousal/Alertness: Awake/alert Behavior During Therapy: Anxious Overall Cognitive Status: Within Functional Limits for tasks assessed                      General Comments General comments (skin integrity, edema, etc.): Swollen feet noted    Exercises        Assessment/Plan    PT Assessment Patient needs continued PT services  PT Diagnosis Generalized weakness;Acute pain   PT Problem List Decreased activity tolerance;Decreased balance;Decreased mobility;Decreased strength;Decreased knowledge of use of DME;Decreased safety awareness;Decreased knowledge of precautions;Pain;Cardiopulmonary status limiting activity;Decreased skin integrity;Obesity  PT Treatment Interventions DME instruction;Gait training;Functional mobility training;Therapeutic activities;Therapeutic exercise;Balance training;Patient/family education   PT Goals (Current goals can be found in the Care Plan section) Acute Rehab PT Goals Patient Stated Goal: to go home PT Goal Formulation: With patient Time For Goal Achievement: 03/02/15 Potential to Achieve Goals: Good    Frequency Min 3X/week    Barriers to discharge Decreased caregiver support      Co-evaluation               End of Session Equipment Utilized During Treatment: Gait belt;Oxygen Activity Tolerance: Patient limited by fatigue;Patient limited by pain Patient left: in bed;with call bell/phone within reach Nurse Communication: Mobility status;Need for lift equipment         Time: [id number] PT Time Calculation (min) (ACUTE ONLY): 14 min   Charges:   PT Evaluation $Initial PT Evaluation Tier I: 1 Procedure     PT G CodesDenice Paradise 2015/03/16, 10:32 AM Amanda [facility] Acute Rehabilitation [phone] (pager)

## 2015-02-16 NOTE — Progress Notes (Signed)
Subjective: Patient with continued left ankle pain. She has difficulty putting weight on her left foot. She denies any shortness of breath. She has a dry cough which is her baseline. She is on home oxygen with continuous 4L [place].  Objective: Vital signs in last 24 hours: Filed Vitals:   02/16/15 0818 02/16/15 0821 02/16/15 0900 02/16/15 1100  BP: 102/67  101/64 111/70  Pulse: 89  88 88  Temp: 98.7 F (37.1 C)   98.3 F (36.8 C)  TempSrc: Oral   Oral  Resp: 19     Height:      Weight:      SpO2: 93% 92%     Weight change:   Intake/Output Summary (Last 24 hours) at 02/16/15 1443 Last data filed at 02/16/15 0400  Gross per 24 hour  Intake    503 ml  Output   1025 ml  Net   -522 ml   General: resting in bed Cardiac: RRR, no rubs, murmurs or gallops Pulm: mild crackles b/l lower lobes, more pronounced on the left Abd: soft, nontender, nondistended, BS present Ext: left ankle tender to palpation, decreased ROM, no erythema or bruising. B/l LE edema   Assessment/Plan: Principal Problem:   HCAP (healthcare-associated pneumonia) Active Problems:   Tobacco abuse   Severe chronic obstructive pulmonary disease (HCC)   Chronic diastolic CHF (congestive heart failure) (HCC)   Primary adenocarcinoma of left lung (HCC)   Hypercapnic respiratory failure, chronic (HCC)   Vitamin B12 deficiency neuropathy   Pneumonia   Sprain of left ankle   Fall at home   Hypokalemia  59 y.o. Female with PMH of Stage 2 NSCLC (on radiation therapy), COPD (4L [place] at home, FEV1 42% pred, FEV1/FVC 88% 12/02/14), pulmonary HTN, CAD (DES 2010), CHF (EF 50-55%, g1dCHF) who presented after a fall at home.  Hospital-Acquired Pneumonia: Left lower lobe postobstructive pneumonia noted on CTA of Chest. Of note, her left lower lobe lung mass has increased in size from 5.5 cm to 6.3 cm. She does not report any acute symptoms over her baseline, no fever, increased sputum production, or leukocytosis. She has been  satting well at rest on her home O2 of 4L [place], but has desaturated with activity. She was started on Vancomycin and Zosyn. She is currently undergoing XRT for her NSCLC. We spoke to radiology, who do not think this is radiation pneumonitis per imaging. -D/C Vanc and Zosyn -Start Levaquin 750 mg po daily for 7 days total antibiotic course (anticipated end date 02/21/15)  Left Ankle Sprain: Patient's main concern is her left ankle pain. She has b/l LE edema, with slightly worse swelling at the left ankle. There is no ecchymoses or erythema on exam. She is tender to touch with decreased ROM. Imaging shows no evidence for acute fracture, suggesting this is more likely to be an ankle sprain. PT has evaluated and recommend SNF placement.  -Naproxen 500 mg BID prn pain, Acetaminophen 500 mg q6h prn -Toradol 30 mg once -Ice application  COPD: On home oxygen 4L [place] -Albuterol, Spiriva, Dulera -4 L  to keep SpO2 88-92%   Dispo: Disposition is deferred at this time, awaiting improvement of current medical problems.    The patient does have a current PCP Oval Linsey, MD) and does need an [facility] hospital follow-up appointment after discharge.    LOS: 1 day   Zada Finders, MD 02/16/2015, 2:43 PM

## 2015-02-17 ENCOUNTER — Ambulatory Visit: Admit: 2015-02-17 | Payer: Commercial Managed Care - HMO

## 2015-02-17 ENCOUNTER — Ambulatory Visit
Admission: RE | Admit: 2015-02-17 | Payer: Commercial Managed Care - HMO | Source: Ambulatory Visit | Admitting: Radiation Oncology

## 2015-02-17 ENCOUNTER — Ambulatory Visit: Payer: Commercial Managed Care - HMO

## 2015-02-17 LAB — CBC
HEMATOCRIT: 38.3 % (ref 36.0–46.0)
Hemoglobin: 11.1 g/dL — ABNORMAL LOW (ref 12.0–15.0)
MCH: 31.7 pg (ref 26.0–34.0)
MCHC: 29 g/dL — ABNORMAL LOW (ref 30.0–36.0)
MCV: 109.4 fL — AB (ref 78.0–100.0)
Platelets: 126 10*3/uL — ABNORMAL LOW (ref 150–400)
RBC: 3.5 MIL/uL — ABNORMAL LOW (ref 3.87–5.11)
RDW: 14.2 % (ref 11.5–15.5)
WBC: 6.4 10*3/uL (ref 4.0–10.5)

## 2015-02-17 NOTE — Progress Notes (Signed)
Patient seen and examined. Case d/w residents in detail. I agree with findings and plan as documented in Dr. Serita Grit note.  Patient with episode of SOB and hypoxia this AM. Started on 55% VM. She felt better after breathing treatments. L ankle pain is improving. Only mild bibasilar crackles on lung exam. If persistent or worsening hypoxia would consider repeat CXR. C/w levaquin for treatment of PNA.   Once improved patient will require SNF placement for rehab. Ankle pain is slowly improving. Will monitor

## 2015-02-17 NOTE — Progress Notes (Signed)
Subjective: Patient reports difficulty breathing when sitting at the edge of her bed this morning with associated dizziness. She was desaturating and started on 55% VM after breathing treatments provided some relief. She was speaking well when seen and able to answer questions appropriately. She says her left ankle pain is improving and was able to bear some weight on it. Objective: Vital signs in last 24 hours: Filed Vitals:   02/16/15 2040 02/17/15 0355 02/17/15 0511 02/17/15 0937  BP: 100/62 95/61    Pulse: 104 104    Temp: 99.3 F (37.4 C) 98.4 F (36.9 C)    TempSrc: Oral Oral    Resp: 15 19    Height:      Weight:      SpO2: 88% 88% 92% 90%   Weight change:   Intake/Output Summary (Last 24 hours) at 02/17/15 1547 Last data filed at 02/17/15 1455  Gross per 24 hour  Intake    682 ml  Output   1550 ml  Net   -868 ml   General: resting in bed Cardiac: RRR, no rubs, murmurs or gallops Pulm: mild crackles b/l lower lobes Abd: soft, nontender, nondistended, BS present Ext: left ankle still swollen compared to right, though b/l LEs are edematous   Assessment/Plan: Principal Problem:   HCAP (healthcare-associated pneumonia) Active Problems:   Tobacco abuse   Severe chronic obstructive pulmonary disease (HCC)   Chronic diastolic CHF (congestive heart failure) (HCC)   Primary adenocarcinoma of left lung (HCC)   Hypercapnic respiratory failure, chronic (HCC)   Vitamin B12 deficiency neuropathy   Pneumonia   Sprain of left ankle   Fall at home   Hypokalemia  59 y.o. Female with PMH of Stage 2 NSCLC (on radiation therapy), COPD (4L [place] at home, FEV1 42% pred, FEV1/FVC 88% 12/02/14), pulmonary HTN, CAD (DES 2010), CHF (EF 50-55%, g1dCHF) who presented after a fall at home.  Hospital-Acquired Pneumonia: Left lower lobe postobstructive pneumonia noted on CTA of Chest. Of note, her left lower lobe lung mass has increased in size from 5.5 cm to 6.3 cm. She does not report any  acute symptoms over her baseline, no fever, increased sputum production, or leukocytosis. She is currently undergoing XRT for her NSCLC. -Continue Levaquin 750 mg po daily for 7 days total antibiotic course (anticipated end date 02/21/15)  Left Ankle Sprain: Patient's left ankle pain is improving. She says Toradol was helpful yesterday. -Naproxen 500 mg BID prn pain, Acetaminophen 500 mg J0A prn -Ice application, ACE wrap  COPD: Was hypoxic and short of breath this morning satting 86% on 6L [place]. Placed on 55% VM after breathing treatments provided some relief. Will continue to monitor as underlying pneumonia may cause an exacerbation of her COPD. -Continue to monitor -Albuterol, Spiriva, Dulera -4 L [place] to keep SpO2 88-92%   Dispo: Disposition is deferred at this time, awaiting improvement of current medical problems.    The patient does have a current PCP Oval Linsey, MD) and does need an [facility] hospital follow-up appointment after discharge.    LOS: 2 days   Zada Finders, MD 02/17/2015, 3:47 PM

## 2015-02-17 NOTE — Progress Notes (Signed)
Pt appears SOB at this time SAT 86% on 6L [place]. RT placing pt on 55% VM at this time. RT gave PRN breathing treatment and scheduled inhalers.

## 2015-02-17 NOTE — Evaluation (Signed)
Occupational Therapy Evaluation Patient Details Name: [name] MRN: [id number] DOB: 02/02/1956 Today's Date: 02/17/2015    History of Present Illness 59 y.o. female with Lt ankle pain after a fall which occured in pt's home when she was stepping down from her toilet from using bathroom. 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   Clinical Impression   .Pt admitted to hospital due to reason stated above. Pt currently with functional limitiations due to the deficits listed below (see OT problem list). Prior to admission pt was independent with ADLs. Pt currently requires set up to total assistance with ADLs. Pt expressing concern regarding continuing rehab at a facility due to not being able to go radiation appointments. Pt stated "I have already missed two appointments and do not need to miss another one". Pt will benefit from skilled OT to increase her independence and safety with ADLs and balance to allow discharge to venue listed below.    Follow Up Recommendations  SNF    Equipment Recommendations  Other (comment) (TBD next venue)    Recommendations for Other Services       Precautions / Restrictions Precautions Precautions: Fall Restrictions Weight Bearing Restrictions: No      Mobility Bed Mobility Overal bed mobility: Needs Assistance Bed Mobility: Supine to Sit     Supine to sit: Min guard;HOB elevated        Transfers Overall transfer level: Needs assistance Equipment used: None Transfers: Squat Pivot Transfers     Squat pivot transfers: Min guard     General transfer comment: Pt able to squat pivot towards chair, required verbal cues for safety and sequence due to impulsivitiy    Balance Overall balance assessment: Needs assistance Sitting-balance support: No upper extremity supported;Feet supported Sitting balance-Leahy Scale: Fair     Standing balance support: Bilateral upper  extremity supported Standing balance-Leahy Scale: Poor Standing balance comment: unable to stand in full upright position due to fear of pain in Lt LE                            ADL Overall ADL's : Needs assistance/impaired                     Lower Body Dressing: Total assistance;Sitting/lateral leans Lower Body Dressing Details (indicate cue type and reason): required total assistance for donning bil LE socks Toilet Transfer: Minimal assistance;+2 for safety/equipment;Squat-pivot;BSC Toilet Transfer Details (indicate cue type and reason): Pt displayed impulsive behavior when transferring from bed to [facility], required +2 min A for safety and required verbal cues for sequence and safety. Toileting- Clothing Manipulation and Hygiene: Total assistance;Sit to/from stand Toileting - Clothing Manipulation Details (indicate cue type and reason): Pt unable to perform toileting hygiene, however was able to stand bent over bed to allow therapist to perform peri-care             Vision     Perception     Praxis      Pertinent Vitals/Pain Pain Assessment: 0-10 Pain Score: 9  Pain Location: Lt ankle Pain Descriptors / Indicators: Aching Pain Intervention(s): Monitored during session;Repositioned (Offered pt ice to help with pain and edema pt declined)     Hand Dominance Right   Extremity/Trunk Assessment Upper Extremity Assessment Upper Extremity Assessment: Overall WFL for tasks assessed   Lower Extremity Assessment Lower Extremity Assessment: Defer to PT evaluation  Cervical / Trunk Assessment Cervical / Trunk Assessment: Normal   Communication Communication Communication: No difficulties   Cognition Arousal/Alertness: Awake/alert Behavior During Therapy: Impulsive Overall Cognitive Status: No family/caregiver present to determine baseline cognitive functioning         Following Commands: Follows one step commands consistently Safety/Judgement:  Decreased awareness of safety     General Comments: Pt displaying decreased safety awareness and implusive behavior.   General Comments    SpO2 destat to 88% while breifly off and HR increased to 114bpm, however SpO2 increased back to 92% once oxygen was back in. Only removed so pt could perform toilet transfer and added extension line to oxygen    Exercises       Shoulder Instructions      Home Living Family/patient expects to be discharged to:: Private residence Living Arrangements: Other relatives Available Help at Discharge: Family;Available 24 hours/day Type of Home: House Home Access: Stairs to enter [employer] of Steps: 2 Entrance Stairs-Rails: None Home Layout: Multi-level Alternate Level Stairs-Number of Steps: 1 (has to step up and down on a stool to get to bathroom) Alternate Level Stairs-Rails: None Bathroom Shower/Tub: Teacher, early years/pre: Standard Bathroom Accessibility: Yes   Home Equipment: Cane - single point;Crutches;Walker - 2 wheels;Shower seat;Wheelchair - manual   Additional Comments: Pt has add-on bathroom which requires her to step on stool to access. Pt report grandson and roommate can assist her upon discharge      Prior Functioning/Environment Level of Independence: Independent             OT Diagnosis: Generalized weakness;Acute pain   OT Problem List: Decreased strength;Decreased activity tolerance;Impaired balance (sitting and/or standing);Decreased safety awareness;Decreased knowledge of use of DME or AE;Pain   OT Treatment/Interventions: Self-care/ADL training;Therapeutic exercise;DME and/or AE instruction;Therapeutic activities;Patient/family education;Balance training    OT Goals(Current goals can be found in the care plan section) Acute Rehab OT Goals Patient Stated Goal: to go home OT Goal Formulation: With patient Time For Goal Achievement: 03/03/15 Potential to Achieve Goals: Good ADL Goals Pt Will  Perform Grooming: with modified independence;sitting (standing if can tolerate) Pt Will Perform Lower Body Bathing: with modified independence;with adaptive equipment;sit to/from stand Pt Will Perform Lower Body Dressing: with modified independence;with adaptive equipment;sit to/from stand Pt Will Transfer to Toilet: with supervision;ambulating;bedside commode Pt Will Perform Toileting - Clothing Manipulation and hygiene: with min assist;sit to/from stand  OT Frequency: Min 2X/week   Barriers to D/C:            Co-evaluation              End of Session Equipment Utilized During Treatment: Gait belt;Oxygen (4L of O2) Nurse Communication: Patient requests pain meds  Activity Tolerance: Patient tolerated treatment well Patient left: in bed;with call bell/phone within reach;with chair alarm set   Time: 5947-0761 OT Time Calculation (min): 22 min Charges:  OT General Charges $OT Visit: 1 Procedure OT Evaluation $Initial OT Evaluation Tier I: 1 Procedure G-Codes:    Lin Landsman 2015/02/19, 2:44 PM

## 2015-02-18 ENCOUNTER — Ambulatory Visit: Payer: Commercial Managed Care - HMO

## 2015-02-18 ENCOUNTER — Ambulatory Visit
Admission: RE | Admit: 2015-02-18 | Discharge: 2015-02-18 | Disposition: A | Discharge status: Home / Self Care | Payer: Commercial Managed Care - HMO | Source: Ambulatory Visit | Attending: Radiation Oncology | Admitting: Radiation Oncology

## 2015-02-18 DIAGNOSIS — J189 Pneumonia, unspecified organism: Secondary | ICD-10-CM

## 2015-02-18 LAB — BLOOD GAS, ARTERIAL
ACID-BASE EXCESS: 14.9 mmol/L — AB (ref 0.0–2.0)
BICARBONATE: 41.4 meq/L — AB (ref 20.0–24.0)
DRAWN BY: 44589
O2 CONTENT: 6 L/min
O2 Saturation: 91.2 %
PATIENT TEMPERATURE: 98.6
PCO2 ART: 81 mmHg — AB (ref 35.0–45.0)
TCO2: 43.9 mmol/L (ref 0–100)
pH, Arterial: 7.329 — ABNORMAL LOW (ref 7.350–7.450)
pO2, Arterial: 63.7 mmHg — ABNORMAL LOW (ref 80.0–100.0)

## 2015-02-18 MED ORDER — IPRATROPIUM-ALBUTEROL 0.5-2.5 (3) MG/3ML IN SOLN
3.0000 mL | RESPIRATORY_TRACT | Status: DC
  Administered 2015-02-18: 3 mL via RESPIRATORY_TRACT
  Filled 2015-02-18: qty 3

## 2015-02-18 MED ORDER — LEVOFLOXACIN 750 MG PO TABS: 750.0000 mg | ORAL_TABLET | Freq: Every day | ORAL | Status: DC

## 2015-02-18 NOTE — Progress Notes (Signed)
Panic co2 level of 81 reported by lab. MD paged and notified

## 2015-02-18 NOTE — Care Management Important Message (Signed)
Important Message  Patient Details  Name: [name] MRN: [id number] Date of Birth: 1955-12-07   Medicare Important Message Given:  Yes    Barb Merino Granger 02/18/2015, 12:41 PM

## 2015-02-18 NOTE — Progress Notes (Signed)
Pt discharged home this pm. Condition stable. Will attend radiation clinic at 1410hrs today as an outpatient

## 2015-02-18 NOTE — Progress Notes (Signed)
Pt condition much improved at this time. Oxygen sats 90% and higher on 4l oxygen via nasal canula. Scheduled for discharge home this pm

## 2015-02-18 NOTE — Progress Notes (Signed)
Orthopedic Tech Progress Note Patient Details:  NYKIAH MA 15-Jun-1955 [id number]  Ortho Devices Type of Ortho Device: ASO Ortho Device/Splint Location: lle Ortho Device/Splint Interventions: Application   Hildred Priest 02/18/2015, 12:31 PM

## 2015-02-18 NOTE — Progress Notes (Signed)
Physical Therapy Treatment Patient Details Name: [name] MRN: [id number] DOB: 08/26/1955 Today's Date: 02/18/2015    History of Present Illness 59 y.o. female with Lt ankle pain after a fall which occured in pt's home when she was stepping down from her toilet from using bathroom. 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    PT Comments    Pt has progressed well with ability to transfer at mod I level. However, due to left ankle pain, she is still unable to ambulate even short distances. Dr. Posey Pronto ordered ankle brace for increased stability and pain management. Recommend outpt PT to address left ankle weakness.    Follow Up Recommendations  Outpatient PT;Supervision - Intermittent     Equipment Recommendations  3in1 (PT) (TBA)    Recommendations for Other Services       Precautions / Restrictions Precautions Precautions: Fall Restrictions Weight Bearing Restrictions: No    Mobility  Bed Mobility Overal bed mobility: Modified Independent Bed Mobility: Sit to Supine;Supine to Sit     Supine to sit: HOB elevated;Modified independent (Device/Increase time) Sit to supine: Modified independent (Device/Increase time);HOB elevated   General bed mobility comments: pt able to move in and out of bed without assist  Transfers Overall transfer level: Modified independent Equipment used: Rolling walker (2 wheeled) Transfers: Sit to/from [facility] Sit to Stand: Modified independent (Device/Increase time) Stand pivot transfers: Modified independent (Device/Increase time)       General transfer comment: pt able to stand from bed and BSC as well as transfer to and from with use of RW and no assistance from PT  Ambulation/Gait Ambulation/Gait assistance: Min assist Ambulation Distance (Feet): 1 Feet Assistive device: Rolling walker (2 wheeled) Gait Pattern/deviations: Step-to pattern;Wide  base of support     General Gait Details: pt only abnle to take 2 steps before ankle too painful and had to sit. Distance not functional for getting around house or even through a doorway. Discussed a brace with lateral stability to help pain and instability and spoke with Dr Posey Pronto who ordered ASO for left ankle.    Stairs            Wheelchair Mobility    Modified Rankin (Stroke Patients Only)       Balance Overall balance assessment: History of Falls;Needs assistance Sitting-balance support: No upper extremity supported Sitting balance-Leahy Scale: Good Sitting balance - Comments: Pt able to sit EOB without support and can reach side to side.    Standing balance support: Single extremity supported Standing balance-Leahy Scale: Poor Standing balance comment: pt requires stable surface to stand safely                    Cognition Arousal/Alertness: Awake/alert Behavior During Therapy: Impulsive Overall Cognitive Status: No family/caregiver present to determine baseline cognitive functioning         Following Commands: Follows one step commands consistently Safety/Judgement: Decreased awareness of safety     General Comments: pt still moves very quickly but ankle pain is less today so pt is more safe with her mobility    Exercises      General Comments        Pertinent Vitals/Pain Pain Assessment: Faces Faces Pain Scale: Hurts even more Pain Location: left ankle    Home Living  Prior Function            PT Goals (current goals can now be found in the care plan section) Acute Rehab PT Goals Patient Stated Goal: to go home PT Goal Formulation: With patient Time For Goal Achievement: 03/02/15 Potential to Achieve Goals: Good Progress towards PT goals: Progressing toward goals    Frequency  Min 3X/week    PT Plan Discharge plan needs to be updated    Co-evaluation             End of Session Equipment  Utilized During Treatment: Oxygen Activity Tolerance: Patient limited by pain;Patient tolerated treatment well Patient left: in bed;with call bell/phone within reach     Time: 1140-1157 PT Time Calculation (min) (ACUTE ONLY): 17 min  Charges:  $Therapeutic Activity: 8-22 mins                    G Codes:     Leighton Roach, PT  Acute Rehab Services  [phone]  Leighton Roach 02/18/2015, 12:23 PM

## 2015-02-18 NOTE — Progress Notes (Signed)
Pt has low oxygen sats around the high 70s and low 80s. Respiratory therapist called and notified. Pt does not show any signs of respiratory distress, encouraged to cough and deep breathe with no improvement on oxygen saturations noted

## 2015-02-18 NOTE — Discharge Instructions (Signed)
Please continue to take the antibiotic Levaquin as prescribed, your last dose will be on November 20th. Follow up with your regular doctor in 1-2 weeks.

## 2015-02-18 NOTE — Care Management Note (Signed)
Case Management Note  Patient Details  Name: [name] MRN: [id number] Date of Birth: 1956-02-12  Subjective/Objective:                    Action/Plan:   Expected Discharge Date:                  Expected Discharge Plan:  Skilled Nursing Facility  In-House Referral:  Clinical Social Work  Discharge planning Services     Post Acute Care Choice:    Choice offered to:     DME Arranged:    DME Agency:     HH Arranged:    [place] Agency:     Status of Service:  In process, will continue to follow  Medicare Important Message Given:    Date Medicare IM Given:    Medicare IM give by:    Date Additional Medicare IM Given:    Additional Medicare Important Message give by:     If discussed at [place] of Stay Meetings, dates discussed:    Additional Comments: UR updated   Marilu Favre, RN 02/18/2015, 9:52 AM

## 2015-02-18 NOTE — Progress Notes (Signed)
Subjective: Patient was not saturating well this morning, but was breathing better and felt she was almost to her baseline. She was given breathing treatments and afterwards was breathing well on her home 4L O2 [place]. She says her left ankle pain is improving. She is refusing to go to SNF, but is agreeable for home health. Objective: Vital signs in last 24 hours: Filed Vitals:   02/17/15 1430 02/17/15 2022 02/17/15 2104 02/18/15 0507  BP: 96/76  99/49 94/60  Pulse: 117  95 105  Temp: 97.7 F (36.5 C)  97.8 F (36.6 C) 98.9 F (37.2 C)  TempSrc: Oral  Oral Oral  Resp: _0 Height:      Weight:      SpO2: 99% 98% 92% 90%   Weight change:   Intake/Output Summary (Last 24 hours) at 02/18/15 1144 Last data filed at 02/18/15 0900  Gross per 24 hour  Intake    720 ml  Output   2500 ml  Net  -1780 ml   General: resting in bed Cardiac: RRR, no rubs, murmurs or gallops Pulm: mild crackles b/l lower lobes Abd: soft, nontender, nondistended, BS present Ext: left ankle wrapped, b/l LE edema   Assessment/Plan: Principal Problem:   HCAP (healthcare-associated pneumonia) Active Problems:   Tobacco abuse   Severe chronic obstructive pulmonary disease (HCC)   Chronic diastolic CHF (congestive heart failure) (HCC)   Primary adenocarcinoma of left lung (HCC)   Hypercapnic respiratory failure, chronic (HCC)   Vitamin B12 deficiency neuropathy   Pneumonia   Sprain of left ankle   Fall at home   Hypokalemia  59 y.o. Female with PMH of Stage 2 NSCLC (on radiation therapy), COPD (4L [place] at home, FEV1 42% pred, FEV1/FVC 88% 12/02/14), pulmonary HTN, CAD (DES 2010), CHF (EF 50-55%, g1dCHF) who presented after a fall at home.  Hospital-Acquired Pneumonia: Left lower lobe postobstructive pneumonia noted on CTA of Chest. Of note, her left lower lobe lung mass has increased in size from 5.5 cm to 6.3 cm. She does not report any acute symptoms over her baseline, no fever, increased sputum  production, or leukocytosis. She is currently undergoing XRT for her NSCLC. -Discharge to XRT session -Continue Levaquin 750 mg po daily for 7 days total antibiotic course  Left Ankle Sprain: Patient's left ankle pain is improving. Can bear more weight on ankle. -HHPT, Crutches on discharge -Naproxen 500 mg BID prn pain, Acetaminophen 500 mg I5Y prn -Ice application, ACE wrap  COPD: Was hypoxic this morning satting in the 80s on 6L [place]. She was given breathing treatments, and when seen again was satting well at 90-94 % on her home 4L [place].  -Albuterol, Spiriva, Dulera -4 L [place] to keep SpO2 88-92%   Dispo: Disposition is deferred at this time, awaiting improvement of current medical problems.    The patient does have a current PCP Oval Linsey, MD) and does need an [facility] hospital follow-up appointment after discharge.    LOS: 3 days   Zada Finders, MD 02/18/2015, 11:44 AM

## 2015-02-18 NOTE — Discharge Summary (Signed)
Name: [name] MRN: [id number] DOB: 06-19-1955 59 y.o. PCP: Oval Linsey, MD  Date of Admission: 02/15/2015  1:21 PM Date of Discharge: 02/18/2015 Attending Physician: Aldine Contes, MD  Discharge Diagnosis: Post-Obstructive Pneumonia  Discharge Medications:   Medication List    STOP taking these medications        doxycycline 100 MG capsule  Commonly known as:  VIBRAMYCIN      TAKE these medications        acetaminophen 500 MG tablet  Commonly known as:  TYLENOL  Take 500 mg by mouth every 6 (six) hours as needed for moderate pain.     albuterol (2.5 MG/3ML) 0.083% nebulizer solution  Commonly known as:  PROVENTIL  Take 3 mLs (2.5 mg total) by nebulization every 6 (six) hours as needed for shortness of breath.     albuterol 108 (90 BASE) MCG/ACT inhaler  Commonly known as:  VENTOLIN HFA  Inhale 2 puffs into the lungs every 6 (six) hours as needed for shortness of breath.     ALEVE 220 MG Caps  Generic drug:  Naproxen Sodium  Take 220 mg by mouth 2 (two) times daily as needed (pain).     aspirin 81 MG chewable tablet  Chew 1 tablet (81 mg total) by mouth at bedtime.     Fluticasone-Salmeterol 250-50 MCG/DOSE Aepb  Commonly known as:  ADVAIR DISKUS  Inhale 1 puff into the lungs 2 (two) times daily.     furosemide 20 MG tablet  Commonly known as:  LASIX  Take 20 mg by mouth 2 (two) times daily as needed for fluid or edema.     furosemide 40 MG tablet  Commonly known as:  LASIX  Take 1 tablet (40 mg total) by mouth daily as needed for fluid.     gabapentin 300 MG capsule  Commonly known as:  NEURONTIN  Take 1 capsule (300 mg total) by mouth 3 (three) times daily.     levofloxacin 750 MG tablet  Commonly known as:  LEVAQUIN  Take 1 tablet (750 mg total) by mouth daily.     lovastatin 40 MG tablet  Commonly known as:  MEVACOR  Take 1 tablet (40 mg total) by mouth at bedtime.     pantoprazole 40 MG tablet  Commonly known as:  PROTONIX  Take 1  tablet (40 mg total) by mouth daily.     potassium chloride SA 20 MEQ tablet  Commonly known as:  K-DUR,KLOR-CON  Take 2 tablets (40 mEq total) by mouth daily as needed (Take when taking lasix).     tiotropium 18 MCG inhalation capsule  Commonly known as:  SPIRIVA HANDIHALER  Place 1 capsule (18 mcg total) into inhaler and inhale daily.        Disposition and follow-up:   [name] was discharged from [facility] in Stable condition.  At the hospital follow up visit please address:  1. -Completion of Levaquin antibiotic course (last date 11/19) for post-obstructive pneumonia.  -Please address if patient has further episodes of hypoxia, requiring more than her home 4L [place]  -Please ask if patient has had further falls, if she is using her crutches/wheelchair to assist with mobility, and if she has improvement in her left ankle pain  2.  Labs / imaging needed at time of follow-up: none  3.  Pending labs/ test needing follow-up: none  Follow-up Appointments: Follow-up Information    Follow up with Karren Cobble, MD. Schedule an  appointment as soon as possible for a visit in 1 week.   Specialty:  Internal Medicine   Contact information:   [address] [place] [address] [phone]       Discharge Instructions: Discharge Instructions    Call MD for:  difficulty breathing, headache or visual disturbances    Complete by:  As directed      Call MD for:  extreme fatigue    Complete by:  As directed      Call MD for:  persistant dizziness or light-headedness    Complete by:  As directed      Call MD for:  severe uncontrolled pain    Complete by:  As directed      Call MD for:  temperature >100.4    Complete by:  As directed      DME Crutches    Complete by:  As directed      Diet - low sodium heart healthy    Complete by:  As directed      Face-to-face encounter (required for Medicare/Medicaid patients)    Complete by:  As directed   I  Zada Finders certify that this patient is under my care and that I, or a nurse practitioner or physician's assistant working with me, had a face-to-face encounter that meets the physician face-to-face encounter requirements with this patient on 02/18/2015. The encounter with the patient was in whole, or in part for the following medical condition(s) which is the primary reason for home health care (List medical condition): COPD, Left ankle sprain  The encounter with the patient was in whole, or in part, for the following medical condition, which is the primary reason for home health care:  COPD, Left ankle sprain  I certify that, based on my findings, the following services are medically necessary home health services:  Physical therapy  Reason for Medically Necessary Home Health Services:  Therapy- Personnel officer, Public librarian  My clinical findings support the need for the above services:  Shortness of breath with activity  Further, I certify that my clinical findings support that this patient is homebound due to:  Shortness of Breath with activity     Home Health    Complete by:  As directed   To provide the following care/treatments:  PT     Increase activity slowly    Complete by:  As directed            Consultations:    Procedures Performed:  Dg Chest 2 View  02/15/2015  CLINICAL DATA:  Increased shortness of breath, left lower lobe mass EXAM: CHEST  2 VIEW COMPARISON:  01/27/2015 FINDINGS: Cardiomegaly evident with chronic vascular congestion. Large left lower lobe masslike opacity persists, better demonstrated on the CT comparison. Right lung remains clear. No superimposed edema or pneumothorax. Trachea midline. IMPRESSION: Cardiomegaly with vascular congestion Left lower lobe masslike opacity, stable to minimal improvement by plain radiography. Patient is undergoing radiation. Electronically Signed   By: Jerilynn Mages.  Shick M.D.   On: 02/15/2015 14:40   Dg Chest 2  View  01/27/2015  CLINICAL DATA:  receiving radiation (earlier today) for lung cancer and started having shortness of breathe associated with weakness, chili's, shaking and coughing up clear mucous. Her O2 was at 66% EXAM: CHEST - 2 VIEW COMPARISON:  12/30/2014 and previous FINDINGS: Left lower lung mass as before. Perihilar interstitial prominence. Heart size upper limits normal. No pneumothorax. No effusion. Surgical clips in the upper abdomen.  IMPRESSION: 1. Left lower lobe mass as before.  No acute disease. Electronically Signed   By: Lucrezia Europe M.D.   On: 01/27/2015 19:02   Dg Tibia/fibula Left  02/15/2015  CLINICAL DATA:  Pt reports 45 minutes PTA she was stepping down step from bathroom and left foot/leg gave out,laid on floor for 20 minutes before grandson got up to help her off floor. Pain to left ankle, unable to bear full weight on left side. Pt reports she has had increase in shortness of breath with rest. Pt usually only short of breath with exertion EXAM: LEFT TIBIA AND FIBULA - 2 VIEW COMPARISON:  None. FINDINGS: No convincing fracture. No bone lesion. Knee and ankle joints are normally aligned. There is diffuse subcutaneous soft tissue edema throughout the leg most prominently involving the distal leg and ankle. IMPRESSION: No fracture or dislocation. Electronically Signed   By: Lajean Manes M.D.   On: 02/15/2015 14:37   Dg Ankle Complete Left  02/15/2015  CLINICAL DATA:  Fall when stepping down from bathroom with left ankle pain. Unable to bear weight. EXAM: LEFT ANKLE COMPLETE - 3+ VIEW COMPARISON:  07/01/2013 and 10/24/2011 FINDINGS: Moderate soft tissue swelling over the ankle. Ankle mortise is within normal. Findings suggesting an old distal fibular fracture. No acute fracture or dislocation. Minimal degenerative change over the midfoot/hindfoot. Spurring over the posterior and inferior calcaneus. IMPRESSION: No acute fracture. Electronically Signed   By: Marin Olp M.D.   On:  02/15/2015 14:37   Ct Angio Chest Pe W/cm &/or Wo Cm  02/15/2015  ADDENDUM REPORT: 02/15/2015 18:11 ADDENDUM: The previously noted left lower lobe lung mass has increased in size, from 5.5 cm to 6.3 cm. Surrounding new lower attenuation airspace consolidation most likely reflects postobstructive pneumonia. Electronically Signed   By: Garald Balding M.D.   On: 02/15/2015 18:11  02/15/2015  CLINICAL DATA:  History of lung carcinoma with increasing shortness of Breath EXAM: CT ANGIOGRAPHY CHEST WITH CONTRAST TECHNIQUE: Multidetector CT imaging of the chest was performed using the standard protocol during bolus administration of intravenous contrast. Multiplanar CT image reconstructions and MIPs were obtained to evaluate the vascular anatomy. CONTRAST:  22m OMNIPAQUE IOHEXOL 350 MG/ML SOLN COMPARISON:  Chest x-ray from earlier in the same day FINDINGS: Lungs are well aerated bilaterally. Left lower lobe pneumonia is identified with dense consolidation. No other focal infiltrate is noted. No sizable parenchymal lesions are seen. The thoracic inlet is within normal limits. Calcific changes of the thoracic aorta are noted without aneurysmal dilatation or dissection. The pulmonary artery demonstrates a normal branching pattern. No filling defects to suggest pulmonary emboli are identified. No significant hilar or mediastinal adenopathy is noted. Upper abdomen and bony structures are within normal limits. Postsurgical changes are noted about the gastroesophageal junction. Review of the MIP images confirms the above findings. IMPRESSION: Left lower lobe pneumonia. No evidence of pulmonary embolism. Electronically Signed: By: MInez CatalinaM.D. On: 02/15/2015 17:03    2D Echo: TTE 05/25/2014 - Left ventricle: The cavity size was normal. Wall thickness was increased in a pattern of mild LVH. Systolic function was normal. The estimated ejection fraction was in the range of 50% to 55%. Regional wall motion  abnormalities cannot be excluded. Doppler parameters are consistent with abnormal left ventricular relaxation (grade 1 diastolic dysfunction). - Aortic valve: Valve area (Vmax): 2.59 cm^2. - Left atrium: The atrium was mildly dilated. - Right atrium: The atrium was mildly dilated. - Pericardium, extracardiac: A trivial pericardial effusion  was identified posterior to the heart.  Cardiac Cath: 8/8//14 Final Conclusions:Mild to moderate pulmonary arterial hypertension, normal PCWP. I suspect that the pulmonary arterial HTN is due to a combination of COPD with hypoxemia and possible OSA. She will need sleep study as outpatient and will need to continue home oxygen. Given normal PCWP, can transition to po Lasix today.   Admission HPI: Ms. Duba is a 59 year old woman with a past medical history of Stage 2 NSCLC (on radiation therapy), COPD (4L [place] at home, FEV1 42% pred, FEV1/FVC 88% 12/02/14), pulmonary HTN, CAD (DES 2010), CHF (EF 50-55%, g1dCHF), BPPV, and B12 Deficiency who presents with left ankle pain after a fall. This occurred in her home when she was stepping down from her toilet from using the bathroom. She uses an add-on bathroom in a soon-to-be condemned home that has multiple steps. She lost sense of where her foot was in space and believe she rolled her ankle while getting down. She reports he ankle pain as a severe aching feeling. She denied any salient antecedents such as vertigo, light-headedness, or heart palpitations. She denies hitting her head during the fall or losing consciousness. She reported feeling short of breath after the fall, which has since resolved. She has a chronic dry cough that is unchanged. Otherwise, she denies any fever, chills, night sweats, chest pain, nausea, vomiting, diarrhea, increased sputum production, hemoptysis, or blackouts.   She reports at least a couple falls in the past year with a similar description of her "foot giving out." She endorses  episodic vertigo, but only when she lies on her back. 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 She was started on Gabapentin 300 mg TID, but she has only been taking one pill at night due to daytime somnolence, and she addresses her "stabbing" sensation (separate from her new ankle pain) in the day with either Tylenol or Aleve, which is moderately effective. She also says her vision is very poor and it has been a while since she has updated her glasses prescription. She reports there her home is difficult to navigate and will be condemned within the month. She has access to a walker at home, but rarely uses it. She says she is first on the wait-list for an apartment. She continues to smoke a few cigarettes a day, has not tried nicotine replacement therapy before, and is not interested at the time. She does not drink.   With respect to management of her Stage 2 NSCLC, she is nearing the end of a 6 week course of radiation. She is scheduled to receive 4 treatments this week and her final two next week. She is not on chemotherapy, and is followed by oncologist Dr. Curt Bears.   In the ED, Tibia/fibula and ankle xrays on the left demonstrated no fracture or dislocation. She was noted to be tachycardic to 103, and in the setting of her known malignancy, and D-dimer was obtained which was elevated to 1.12. She continued to deny chest pain, but was on 6L [place], over her home 4L O2 [place]. A CT angiogram was obtained which demonstrated no pulmonary embolism, but showed a left lower lobe pneumonia. She was briefly hypotensive to 86/60 which has since resolved, and was afebrile without a leukocytosis. Her lactic acid was 1.27. Her heart rate also normalized.   Hospital Course by problem list:  Post-obstructive pneumonia: Patient came to ED  for fall resulting in left ankle sprain. She has been undergoing XRT for NSCLC and  has COPD requiring 4L O2 on [place]. While patient was in the ED for her left ankle sprain, she was noted to be hypoxic and tachycardic. She did not have chest pain but was requiring 6L O2 on [place]. In the setting of known malignancy, D-Dimer was obtained and returned elevated. Subsequently, CT angiogram was obtained which did not show evidence of PE, but did demonstrate a post-obstructive pneumonia in the left lower lobe. Patient did not have symptoms of increased cough or sputum production. She was afebrile and without leukocytosis. IV Vancomycin and Zosyn were begun and quickly de-escalated to oral Levaquin. Bibasilar crackles were noted on exam. Patient did have further episodes of hypoxia requiring 55% VM for a short period of time. O2 status became stable at 88-92% on her home 4L via [place] and she was discharged to immediately resume her radiation therapy.  Left ankle sprain: Patient sprained left ankle after falling at home when stepping onto a stool to reach her bathroom. She was noted to have a swollen left ankle compared to her right, although her legs were edematous bilaterally. Xray was negative for acute fracture or dislocation. Pain was controlled with Naproxen, Acetaminophen, one time Toradol shot, and ice application/ACE wrap. She was able to bear some weight on her ankle at discharge and given ankle brace and order for crutches to use at home.  Discharge Vitals:   BP 94/60 mmHg  Pulse 100  Temp(Src) 98.9 F (37.2 C) (Oral)  Resp 20  Ht _0  (1.651 m)  Wt 262 lb 9.1 oz (119.1 kg)  BMI 43.69 kg/m2  SpO2 90%  Discharge Labs:  No results found for this or any previous visit (from the past 24 hour(s)).  Signed: Zada Finders, MD 02/20/2015, 3:40 PM    Services Ordered on Discharge: [place] Ordered on Discharge: Crutches, Ankle Brace

## 2015-02-19 ENCOUNTER — Ambulatory Visit
Admission: RE | Admit: 2015-02-19 | Discharge: 2015-02-19 | Disposition: A | Discharge status: Home / Self Care | Payer: Commercial Managed Care - HMO | Source: Ambulatory Visit | Attending: Radiation Oncology | Admitting: Radiation Oncology

## 2015-02-19 ENCOUNTER — Ambulatory Visit: Payer: Commercial Managed Care - HMO

## 2015-02-19 DIAGNOSIS — C3490 Malignant neoplasm of unspecified part of unspecified bronchus or lung: Secondary | ICD-10-CM | POA: Diagnosis not present

## 2015-02-19 DIAGNOSIS — S93402A Sprain of unspecified ligament of left ankle, initial encounter: Secondary | ICD-10-CM | POA: Insufficient documentation

## 2015-02-19 DIAGNOSIS — Z510 Encounter for antineoplastic radiation therapy: Secondary | ICD-10-CM | POA: Diagnosis not present

## 2015-02-19 DIAGNOSIS — C3492 Malignant neoplasm of unspecified part of left bronchus or lung: Secondary | ICD-10-CM | POA: Diagnosis not present

## 2015-02-19 NOTE — Progress Notes (Signed)
Patient ID: [name], female   DOB: February 01, 1956, 59 y.o.   MRN: [id number] Medicine attending discharge note: I personally examined this patient on the day of discharge together with resident physician Dr. Zada Finders and I tested the accuracy of discharge evaluation and plan as recorded in his progress note dated 02/18/2015.  Clinical summary: The proximate cause for admission was a fall at home where she twisted her left ankle but did not fracture it. She was treated conservatively with nonsteroidals, Ace bandage, ice, and was seen in consultation by occupational therapy and physical therapy. Outpatient follow-up will be arranged. 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 EGFR and ALK mutations not detected. Biopsies of mediastinal lymph nodes were negative. In view of her advanced, oxygen dependent, obstructive airway disease and Gold  IV obstructive airway disease, she was referred for radiation and is near completion of a planned course of 7000 gray by Dr. Thea Silversmith. Due to increasing dyspnea and hypoxia at time of admission compared with her baseline, CT angiogram of the chest was done to rule out pulmonary embolus. No emboli were seen however an area of postobstructive pneumonia in the left lower lobe was seen. She had no fever or sputum production. No leukocytosis. She was started on a seven-day course of Levaquin.  Disposition: Condition stable at time of discharge. She will continue her home oxygen and other bronchodilators and inhaled steroids. She will complete her course of radiation therapy and will resume immediately upon discharge today. She will follow up with oncology doctors Earlie Server and Pablo Ledger as well as Dr. Eppie Gibson for her general medical problems in our outpatient clinic. There were no complications.

## 2015-02-20 ENCOUNTER — Ambulatory Visit
Admission: RE | Admit: 2015-02-20 | Discharge: 2015-02-20 | Disposition: A | Discharge status: Home / Self Care | Payer: Commercial Managed Care - HMO | Source: Ambulatory Visit | Attending: Radiation Oncology | Admitting: Radiation Oncology

## 2015-02-20 ENCOUNTER — Ambulatory Visit: Payer: Commercial Managed Care - HMO

## 2015-02-20 ENCOUNTER — Ambulatory Visit: Payer: Commercial Managed Care - HMO | Admitting: Internal Medicine

## 2015-02-20 ENCOUNTER — Encounter: Payer: Self-pay | Admitting: Internal Medicine

## 2015-02-20 DIAGNOSIS — Z510 Encounter for antineoplastic radiation therapy: Secondary | ICD-10-CM | POA: Diagnosis not present

## 2015-02-20 DIAGNOSIS — C3492 Malignant neoplasm of unspecified part of left bronchus or lung: Secondary | ICD-10-CM | POA: Diagnosis not present

## 2015-02-20 DIAGNOSIS — C3490 Malignant neoplasm of unspecified part of unspecified bronchus or lung: Secondary | ICD-10-CM | POA: Diagnosis not present

## 2015-02-20 LAB — CULTURE, BLOOD (ROUTINE X 2)
CULTURE: NO GROWTH
Culture: NO GROWTH

## 2015-02-22 ENCOUNTER — Ambulatory Visit
Admission: RE | Admit: 2015-02-22 | Discharge: 2015-02-22 | Disposition: A | Discharge status: Home / Self Care | Payer: Commercial Managed Care - HMO | Source: Ambulatory Visit | Attending: Radiation Oncology | Admitting: Radiation Oncology

## 2015-02-22 DIAGNOSIS — C3492 Malignant neoplasm of unspecified part of left bronchus or lung: Secondary | ICD-10-CM | POA: Diagnosis not present

## 2015-02-22 DIAGNOSIS — C3490 Malignant neoplasm of unspecified part of unspecified bronchus or lung: Secondary | ICD-10-CM | POA: Diagnosis not present

## 2015-02-22 DIAGNOSIS — Z510 Encounter for antineoplastic radiation therapy: Secondary | ICD-10-CM | POA: Diagnosis not present

## 2015-02-23 ENCOUNTER — Ambulatory Visit: Payer: Commercial Managed Care - HMO

## 2015-02-23 ENCOUNTER — Ambulatory Visit
Admission: RE | Admit: 2015-02-23 | Discharge: 2015-02-23 | Disposition: A | Discharge status: Home / Self Care | Payer: Commercial Managed Care - HMO | Source: Ambulatory Visit | Attending: Radiation Oncology | Admitting: Radiation Oncology

## 2015-02-23 DIAGNOSIS — C3490 Malignant neoplasm of unspecified part of unspecified bronchus or lung: Secondary | ICD-10-CM | POA: Diagnosis not present

## 2015-02-23 DIAGNOSIS — Z510 Encounter for antineoplastic radiation therapy: Secondary | ICD-10-CM | POA: Diagnosis not present

## 2015-02-23 DIAGNOSIS — C3492 Malignant neoplasm of unspecified part of left bronchus or lung: Secondary | ICD-10-CM | POA: Diagnosis not present

## 2015-02-24 ENCOUNTER — Ambulatory Visit: Payer: Commercial Managed Care - HMO

## 2015-02-24 ENCOUNTER — Ambulatory Visit
Admission: RE | Admit: 2015-02-24 | Discharge: 2015-02-24 | Disposition: A | Discharge status: Home / Self Care | Payer: Commercial Managed Care - HMO | Source: Ambulatory Visit | Attending: Radiation Oncology | Admitting: Radiation Oncology

## 2015-02-24 ENCOUNTER — Ambulatory Visit
Admission: RE | Admit: 2015-02-24 | Payer: Commercial Managed Care - HMO | Source: Ambulatory Visit | Admitting: Radiation Oncology

## 2015-02-24 DIAGNOSIS — C3492 Malignant neoplasm of unspecified part of left bronchus or lung: Secondary | ICD-10-CM | POA: Diagnosis not present

## 2015-02-24 DIAGNOSIS — C3490 Malignant neoplasm of unspecified part of unspecified bronchus or lung: Secondary | ICD-10-CM | POA: Diagnosis not present

## 2015-02-24 DIAGNOSIS — Z510 Encounter for antineoplastic radiation therapy: Secondary | ICD-10-CM | POA: Diagnosis not present

## 2015-02-25 ENCOUNTER — Ambulatory Visit: Payer: Commercial Managed Care - HMO

## 2015-02-25 ENCOUNTER — Encounter: Payer: Self-pay | Admitting: Radiation Oncology

## 2015-02-25 ENCOUNTER — Ambulatory Visit
Admission: RE | Admit: 2015-02-25 | Discharge: 2015-02-25 | Disposition: A | Discharge status: Home / Self Care | Payer: Commercial Managed Care - HMO | Source: Ambulatory Visit | Attending: Radiation Oncology | Admitting: Radiation Oncology

## 2015-02-25 DIAGNOSIS — Z510 Encounter for antineoplastic radiation therapy: Secondary | ICD-10-CM | POA: Diagnosis not present

## 2015-02-25 DIAGNOSIS — C3492 Malignant neoplasm of unspecified part of left bronchus or lung: Secondary | ICD-10-CM | POA: Diagnosis not present

## 2015-02-25 DIAGNOSIS — C3490 Malignant neoplasm of unspecified part of unspecified bronchus or lung: Secondary | ICD-10-CM | POA: Diagnosis not present

## 2015-03-02 ENCOUNTER — Ambulatory Visit: Payer: Commercial Managed Care - HMO

## 2015-03-02 NOTE — Progress Notes (Signed)
  Radiation Oncology         [phone] ________________________________  Name: FALYNN AILEY MRN: [id number]  Date: 02/25/2015  DOB: [birth year]  End of Treatment Note  Diagnosis:   Primary adenocarcinoma of left lung [facility])   Staging form: Lung, AJCC 7th Edition     Clinical stage from 12/04/2014: Stage IIA (T2b, N0, M0) - Signed by Curt Bears, MD on 12/04/2014       Staging comments: Adenocarcinoma      Indication for treatment:  Curative       Radiation treatment dates:   01/15/2015-02/25/2015  Site/dose:   Left lower lobe to 70.2 Gy at 2.7 Gy per fraction x 26 fractions.   Beams/energy:   A 3D plan was delivered with 10 and 15 MV photons. Daily CBCT was used for image guidance.   Narrative: The patient tolerated radiation treatment relatively well.   She was seen in the ER a few times for orthopedic complaints as well as breathing difficulties.   Plan: The patient has completed radiation treatment. The patient will return to radiation oncology clinic for routine followup in one month. I advised them to call or return sooner if they have any questions or concerns related to their recovery or treatment.  ------------------------------------------------  Thea Silversmith, MD

## 2015-03-03 ENCOUNTER — Emergency Department (HOSPITAL_COMMUNITY): Payer: Commercial Managed Care - HMO

## 2015-03-03 ENCOUNTER — Inpatient Hospital Stay (HOSPITAL_COMMUNITY)
Admission: EM | Admit: 2015-03-03 | Discharge: 2015-03-06 | DRG: 191 | Disposition: A | Discharge status: Home / Self Care | Payer: Commercial Managed Care - HMO | Attending: Oncology | Admitting: Oncology

## 2015-03-03 ENCOUNTER — Ambulatory Visit: Payer: Commercial Managed Care - HMO

## 2015-03-03 ENCOUNTER — Encounter (HOSPITAL_COMMUNITY): Payer: Self-pay

## 2015-03-03 DIAGNOSIS — Z955 Presence of coronary angioplasty implant and graft: Secondary | ICD-10-CM

## 2015-03-03 DIAGNOSIS — F17210 Nicotine dependence, cigarettes, uncomplicated: Secondary | ICD-10-CM | POA: Diagnosis not present

## 2015-03-03 DIAGNOSIS — E785 Hyperlipidemia, unspecified: Secondary | ICD-10-CM | POA: Diagnosis present

## 2015-03-03 DIAGNOSIS — K219 Gastro-esophageal reflux disease without esophagitis: Secondary | ICD-10-CM | POA: Diagnosis present

## 2015-03-03 DIAGNOSIS — I509 Heart failure, unspecified: Secondary | ICD-10-CM

## 2015-03-03 DIAGNOSIS — E538 Deficiency of other specified B group vitamins: Secondary | ICD-10-CM | POA: Diagnosis not present

## 2015-03-03 DIAGNOSIS — E669 Obesity, unspecified: Secondary | ICD-10-CM | POA: Diagnosis present

## 2015-03-03 DIAGNOSIS — Z8249 Family history of ischemic heart disease and other diseases of the circulatory system: Secondary | ICD-10-CM

## 2015-03-03 DIAGNOSIS — J440 Chronic obstructive pulmonary disease with acute lower respiratory infection: Secondary | ICD-10-CM | POA: Diagnosis not present

## 2015-03-03 DIAGNOSIS — I5032 Chronic diastolic (congestive) heart failure: Secondary | ICD-10-CM | POA: Diagnosis not present

## 2015-03-03 DIAGNOSIS — J9612 Chronic respiratory failure with hypercapnia: Secondary | ICD-10-CM | POA: Diagnosis not present

## 2015-03-03 DIAGNOSIS — G63 Polyneuropathy in diseases classified elsewhere: Secondary | ICD-10-CM | POA: Diagnosis present

## 2015-03-03 DIAGNOSIS — R0602 Shortness of breath: Secondary | ICD-10-CM | POA: Diagnosis present

## 2015-03-03 DIAGNOSIS — J189 Pneumonia, unspecified organism: Secondary | ICD-10-CM | POA: Diagnosis not present

## 2015-03-03 DIAGNOSIS — Y95 Nosocomial condition: Secondary | ICD-10-CM | POA: Diagnosis present

## 2015-03-03 DIAGNOSIS — Z9981 Dependence on supplemental oxygen: Secondary | ICD-10-CM | POA: Diagnosis not present

## 2015-03-03 DIAGNOSIS — I2510 Atherosclerotic heart disease of native coronary artery without angina pectoris: Secondary | ICD-10-CM | POA: Diagnosis not present

## 2015-03-03 DIAGNOSIS — J449 Chronic obstructive pulmonary disease, unspecified: Secondary | ICD-10-CM | POA: Diagnosis not present

## 2015-03-03 DIAGNOSIS — F329 Major depressive disorder, single episode, unspecified: Secondary | ICD-10-CM | POA: Diagnosis present

## 2015-03-03 DIAGNOSIS — Z801 Family history of malignant neoplasm of trachea, bronchus and lung: Secondary | ICD-10-CM | POA: Diagnosis not present

## 2015-03-03 DIAGNOSIS — I272 Other secondary pulmonary hypertension: Secondary | ICD-10-CM | POA: Diagnosis present

## 2015-03-03 DIAGNOSIS — Z923 Personal history of irradiation: Secondary | ICD-10-CM | POA: Diagnosis not present

## 2015-03-03 DIAGNOSIS — C3492 Malignant neoplasm of unspecified part of left bronchus or lung: Secondary | ICD-10-CM | POA: Diagnosis present

## 2015-03-03 DIAGNOSIS — Z6841 Body Mass Index (BMI) 40.0 and over, adult: Secondary | ICD-10-CM

## 2015-03-03 DIAGNOSIS — I5030 Unspecified diastolic (congestive) heart failure: Secondary | ICD-10-CM | POA: Diagnosis present

## 2015-03-03 DIAGNOSIS — W19XXXD Unspecified fall, subsequent encounter: Secondary | ICD-10-CM

## 2015-03-03 DIAGNOSIS — J441 Chronic obstructive pulmonary disease with (acute) exacerbation: Secondary | ICD-10-CM | POA: Diagnosis present

## 2015-03-03 DIAGNOSIS — Y92009 Unspecified place in unspecified non-institutional (private) residence as the place of occurrence of the external cause: Secondary | ICD-10-CM

## 2015-03-03 HISTORY — DX: Personal history of other diseases of the digestive system: Z87.19

## 2015-03-03 HISTORY — DX: Unspecified chronic bronchitis: J42

## 2015-03-03 HISTORY — DX: Malignant neoplasm of unspecified part of unspecified bronchus or lung: C34.90

## 2015-03-03 LAB — BASIC METABOLIC PANEL
Anion gap: 6 (ref 5–15)
CO2: 40 mmol/L — ABNORMAL HIGH (ref 22–32)
CREATININE: 0.81 mg/dL (ref 0.44–1.00)
Calcium: 8.4 mg/dL — ABNORMAL LOW (ref 8.9–10.3)
Chloride: 92 mmol/L — ABNORMAL LOW (ref 101–111)
Glucose, Bld: 137 mg/dL — ABNORMAL HIGH (ref 65–99)
POTASSIUM: 3.6 mmol/L (ref 3.5–5.1)
SODIUM: 138 mmol/L (ref 135–145)

## 2015-03-03 LAB — TROPONIN I: TROPONIN I: 0.03 ng/mL (ref <0.031)

## 2015-03-03 LAB — CBC WITH DIFFERENTIAL/PLATELET
BASOS ABS: 0 10*3/uL (ref 0.0–0.1)
BASOS PCT: 0 %
Eosinophils Absolute: 0.1 10*3/uL (ref 0.0–0.7)
Eosinophils Relative: 2 %
HEMATOCRIT: 41.3 % (ref 36.0–46.0)
Hemoglobin: 12.2 g/dL (ref 12.0–15.0)
LYMPHS PCT: 11 %
Lymphs Abs: 0.7 10*3/uL (ref 0.7–4.0)
MCH: 30.9 pg (ref 26.0–34.0)
MCHC: 29.5 g/dL — ABNORMAL LOW (ref 30.0–36.0)
MCV: 104.6 fL — AB (ref 78.0–100.0)
Monocytes Absolute: 0.5 10*3/uL (ref 0.1–1.0)
Monocytes Relative: 8 %
NEUTROS ABS: 4.5 10*3/uL (ref 1.7–7.7)
NEUTROS PCT: 79 %
Platelets: 251 10*3/uL (ref 150–400)
RBC: 3.95 MIL/uL (ref 3.87–5.11)
RDW: 14.4 % (ref 11.5–15.5)
WBC: 5.8 10*3/uL (ref 4.0–10.5)

## 2015-03-03 LAB — BRAIN NATRIURETIC PEPTIDE: B NATRIURETIC PEPTIDE 5: 811.1 pg/mL — AB (ref 0.0–100.0)

## 2015-03-03 LAB — I-STAT TROPONIN, ED: Troponin i, poc: 0.02 ng/mL (ref 0.00–0.08)

## 2015-03-03 MED ORDER — MOMETASONE FURO-FORMOTEROL FUM 100-5 MCG/ACT IN AERO
2.0000 | INHALATION_SPRAY | Freq: Two times a day (BID) | RESPIRATORY_TRACT | Status: DC
  Administered 2015-03-03 – 2015-03-06 (×6): 2 via RESPIRATORY_TRACT
  Filled 2015-03-03: qty 8.8

## 2015-03-03 MED ORDER — PREDNISONE 20 MG PO TABS
40.0000 mg | ORAL_TABLET | Freq: Every day | ORAL | Status: DC
  Administered 2015-03-04 – 2015-03-06 (×3): 40 mg via ORAL
  Filled 2015-03-03 (×3): qty 2

## 2015-03-03 MED ORDER — PRAVASTATIN SODIUM 10 MG PO TABS
10.0000 mg | ORAL_TABLET | Freq: Every day | ORAL | Status: DC
  Administered 2015-03-03 – 2015-03-05 (×3): 10 mg via ORAL
  Filled 2015-03-03 (×4): qty 1

## 2015-03-03 MED ORDER — IPRATROPIUM-ALBUTEROL 0.5-2.5 (3) MG/3ML IN SOLN
3.0000 mL | RESPIRATORY_TRACT | Status: DC
  Administered 2015-03-03 – 2015-03-04 (×7): 3 mL via RESPIRATORY_TRACT
  Filled 2015-03-03 (×6): qty 3

## 2015-03-03 MED ORDER — ASPIRIN 81 MG PO CHEW: 81.0000 mg | CHEWABLE_TABLET | Freq: Every day | ORAL | Status: DC

## 2015-03-03 MED ORDER — PIPERACILLIN-TAZOBACTAM 3.375 G IVPB 30 MIN
3.3750 g | Freq: Once | INTRAVENOUS | Status: AC
  Administered 2015-03-03: 3.375 g via INTRAVENOUS
  Filled 2015-03-03: qty 50

## 2015-03-03 MED ORDER — PANTOPRAZOLE SODIUM 40 MG PO TBEC
40.0000 mg | DELAYED_RELEASE_TABLET | Freq: Every day | ORAL | Status: DC
  Administered 2015-03-03 – 2015-03-06 (×4): 40 mg via ORAL
  Filled 2015-03-03 (×3): qty 1

## 2015-03-03 MED ORDER — ASPIRIN 81 MG PO CHEW: 324.0000 mg | CHEWABLE_TABLET | Freq: Once | ORAL | Status: DC

## 2015-03-03 MED ORDER — ENOXAPARIN SODIUM 40 MG/0.4ML ~~LOC~~ SOLN
40.0000 mg | SUBCUTANEOUS | Status: DC
  Administered 2015-03-03: 40 mg via SUBCUTANEOUS
  Filled 2015-03-03: qty 0.4

## 2015-03-03 MED ORDER — TIOTROPIUM BROMIDE MONOHYDRATE 18 MCG IN CAPS
18.0000 ug | ORAL_CAPSULE | Freq: Every day | RESPIRATORY_TRACT | Status: DC
Start: 2015-03-04 — End: 2015-03-06
  Administered 2015-03-04 – 2015-03-06 (×3): 18 ug via RESPIRATORY_TRACT
  Filled 2015-03-03: qty 5

## 2015-03-03 MED ORDER — ALBUTEROL SULFATE (2.5 MG/3ML) 0.083% IN NEBU: 2.5000 mg | INHALATION_SOLUTION | Freq: Four times a day (QID) | RESPIRATORY_TRACT | Status: DC | PRN

## 2015-03-03 MED ORDER — ALBUTEROL SULFATE HFA 108 (90 BASE) MCG/ACT IN AERS: 2.0000 | INHALATION_SPRAY | Freq: Four times a day (QID) | RESPIRATORY_TRACT | Status: DC | PRN

## 2015-03-03 MED ORDER — VANCOMYCIN HCL 10 G IV SOLR
2500.0000 mg | Freq: Once | INTRAVENOUS | Status: AC
  Administered 2015-03-03: 2500 mg via INTRAVENOUS
  Filled 2015-03-03: qty 2500

## 2015-03-03 MED ORDER — POTASSIUM CHLORIDE CRYS ER 20 MEQ PO TBCR: 40.0000 meq | EXTENDED_RELEASE_TABLET | Freq: Every day | ORAL | Status: DC | PRN

## 2015-03-03 MED ORDER — METHYLPREDNISOLONE SODIUM SUCC 125 MG IJ SOLR
125.0000 mg | Freq: Once | INTRAMUSCULAR | Status: AC
  Administered 2015-03-03: 125 mg via INTRAVENOUS
  Filled 2015-03-03: qty 2

## 2015-03-03 MED ORDER — PIPERACILLIN-TAZOBACTAM IN DEX 2-0.25 GM/50ML IV SOLN: 2.2500 g | Freq: Once | INTRAVENOUS | Status: DC

## 2015-03-03 MED ORDER — ASPIRIN 81 MG PO CHEW
81.0000 mg | CHEWABLE_TABLET | Freq: Every day | ORAL | Status: DC
  Administered 2015-03-04 – 2015-03-05 (×2): 81 mg via ORAL
  Filled 2015-03-03 (×2): qty 1

## 2015-03-03 MED ORDER — GABAPENTIN 300 MG PO CAPS
300.0000 mg | ORAL_CAPSULE | Freq: Three times a day (TID) | ORAL | Status: DC
  Administered 2015-03-03: 300 mg via ORAL
  Filled 2015-03-03 (×2): qty 1

## 2015-03-03 MED ORDER — GABAPENTIN 300 MG PO CAPS: 300.0000 mg | ORAL_CAPSULE | Freq: Three times a day (TID) | ORAL | Status: DC

## 2015-03-03 MED ORDER — IPRATROPIUM-ALBUTEROL 0.5-2.5 (3) MG/3ML IN SOLN
3.0000 mL | Freq: Once | RESPIRATORY_TRACT | Status: DC
  Filled 2015-03-03: qty 3

## 2015-03-03 NOTE — ED Notes (Addendum)
Pt. Presents with complaint of SOB for past few days and CP starting this AM. Pt. States CP is R sided with no radiation. CP does increase with palpitation. Pt. With hx of L sided lung cancer and recently txt for PNA. Pt. Wears 4L [place] at home. 88% on 4L at EMS arrival, 94% NRB. Pt. Given 324 ASA and 1 Nitro in route.

## 2015-03-03 NOTE — H&P (Signed)
Date: 03/03/2015               Patient Name:  [name] MRN: [id number]  DOB: 1955/11/23 Age / Sex: 59 y.o., female   PCP: Oval Linsey, MD         Medical Service: Internal Medicine Teaching Service         Attending Physician: Dr. Annia Belt, MD    First Contact: Dr. Zada Finders Pager: [phone]  Second Contact: Dr. Osa Craver Pager: [phone]       After Hours (After 5p/  First Contact Pager: [phone]  weekends / holidays): Second Contact Pager: [phone]   Chief Complaint: Shortness of breath  History of Present Illness:  [name] is a 59 year old woman with PMH of Stage 2 NSCLC (completed radiation therapy 11/23), COPD (4L [place] at home, FEV1 42%, FEV1/FVC 88% 12/02/14), pulmonary HTN, CAD s/p DES 2010, CHF (EF 50-55%), BPPV, and B12 deficiency who presents with right sided chest pain and worsened shortness of breath. She has baseline shortness of breath, but states that this morning her dyspnea worsened to the point she needed to call EMS. She thinks her O2 sat was in the low 80s at that time. She also reports right sided chest pain which does not radiate, is dull, constant, and worse with inspiration and palpation. She has associated increase in cough productive of clear sputum, but no fever, chills, or purulent sputum. She denies any orthopnea, peripheral edema, change in weight, N/V/D/C.  She was recently hospitalized from 11/13-//16 for presumed post-obstructive pneumonia and completed a week of antibiotics. At that time she had similar presentation without obvious sign of infection. She completed her radiation treatment on 11/23 and denies ill effects after her sessions other than fatigue.  She was given Vancomycin and Zosyn in the ED for presumed HCAP, and Solu-Medrol for COPD. EKG does show TWI in V2-5 not seen on prior. Initial Troponin is 0.02. BNP elevated at 811 (baseline mid-100 or lower).   Meds: Current Facility-Administered Medications  Medication  Dose Route Frequency Provider Last Rate Last Dose  . albuterol (PROVENTIL) (2.5 MG/3ML) 0.083% nebulizer solution 2.5 mg  2.5 mg Nebulization Q6H PRN Annia Belt, MD      . aspirin chewable tablet 324 mg  324 mg Oral Once Leo Grosser, MD   324 mg at 03/03/15 1441  . [START ON 03/04/2015] aspirin chewable tablet 81 mg  81 mg Oral QHS Annia Belt, MD      . enoxaparin (LOVENOX) injection 40 mg  40 mg Subcutaneous Q24H Tasrif Ahmed, MD   40 mg at 03/03/15 1937  . gabapentin (NEURONTIN) capsule 300 mg  300 mg Oral TID Annia Belt, MD      . ipratropium-albuterol (DUONEB) 0.5-2.5 (3) MG/3ML nebulizer solution 3 mL  3 mL Nebulization Once Gustavus Bryant, MD   3 mL at 03/03/15 1645  . ipratropium-albuterol (DUONEB) 0.5-2.5 (3) MG/3ML nebulizer solution 3 mL  3 mL Nebulization Q4H Tasrif Ahmed, MD   3 mL at 03/03/15 1730  . mometasone-formoterol (DULERA) 100-5 MCG/ACT inhaler 2 puff  2 puff Inhalation BID Dellia Nims, MD   2 puff at 03/03/15 2009  . pantoprazole (PROTONIX) EC tablet 40 mg  40 mg Oral Daily Tasrif Ahmed, MD   40 mg at 03/03/15 1937  . potassium chloride SA (K-DUR,KLOR-CON) CR tablet 40 mEq  40 mEq Oral Daily PRN Tasrif Ahmed, MD      . pravastatin (PRAVACHOL) tablet  10 mg  10 mg Oral q1800 Tasrif Ahmed, MD   10 mg at 03/03/15 1937  . [START ON 03/04/2015] predniSONE (DELTASONE) tablet 40 mg  40 mg Oral Q breakfast Tasrif Ahmed, MD      . Derrill Memo ON 03/04/2015] tiotropium (SPIRIVA) inhalation capsule 18 mcg  18 mcg Inhalation Daily Dellia Nims, MD        Allergies: Allergies as of 03/03/2015 - Review Complete 03/03/2015  Allergen Reaction Noted  . Fluconazole Anaphylaxis, Itching, and Swelling   . Atorvastatin Other (See Comments) 09/09/2014   Past Medical History  Diagnosis Date  . Coronary artery disease     S/P PCI of LAD with DES (12/2008). Total occlusion of RCA noted at that time., medically managed. ACS ruled out 03/2009 with Lexiscan myoview . Followed  by [place].  . Pulmonary hypertension ([place])     2-D Echo (91/6384) - Systolic pressure was moderately increased. PA peak pressure  26mHg. 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 Followed by Dr. WJoya Gaskins([facility] Heckler  . Diastolic dysfunction     2-D Echo (12/2008) - Normal LV Systolic funciton with EF 60-65%. Grade 1 diastolid dysfunction. No regional wall motion abnormalities. Moderate pulmonary HTN with PA peak pressure 518mg.  . Marland KitchenOPD (chronic obstructive pulmonary disease) (HCC)     Severe. Gold Stage IV.  PFTs (12/2008) - severe obstructive airway disease. Active tobacco use. Requires 4L [place] at home.  . Pulmonary nodule, right     Small right middle lobe nodule. Stable as of 12/2008.  . Marland Kitchenrediabetes 12/2008    HgbA1c 6.4 (12/2008)  . Hx MRSA infection     Recurrent MRSA thigh abscesses.  . Tobacco abuse     Ongoing.  . Obesity   . Hyperlipidemia   . GERD (gastroesophageal reflux disease)     S/P Nissen fundoplication.  . CHF (congestive heart failure) ([place]  . H/O right coronary artery stent placement 2010  . On home oxygen therapy since 2010    4L all the time  . Shortness of breath dyspnea     with a lot of exertion; if fluid builds up  . Pneumonia   . Depression   . Arthritis   . Cancer ([facility]    lung cancer  . Full dentures    Past Surgical History  Procedure Laterality Date  . Total abdominal hysterectomy w/ bilateral salpingoophorectomy    . Nissen fundoplication    . Right heart catheterization N/A 11/09/2012    Procedure: RIGHT HEART CATH;  Surgeon: DaLarey DresserMD;  [facility];  Service: Cardiovascular;  Laterality: N/A;  . Appendectomy    . Tonsillectomy    . [facility] Breast surgery  25 years ago    Lumpectomy (biopsy negative)  . [facility]    . Knee  cartilage surgery Right 10 years ago approx  . Tubal ligation  1979  . Video bronchoscopy with endobronchial ultrasound N/A 12/15/2014    Procedure: VIDEO BRONCHOSCOPY WITH ENDOBRONCHIAL ULTRASOUND;  Surgeon: PeIvin PootMD;  [facility];  Service: Thoracic;  Laterality: N/A;   Family History  Problem Relation Age of Onset  . Heart disease Mother 4770  Deceased from MI at 60yo. Hypertension Mother   . Heart  disease Father 92    Deceased of MI age 64yo  . Hypertension Father   . Hypertension Brother   . Lung cancer      Grandmother   Social History   Social History  . Marital Status: Single    Spouse Name: N/A  . Number of Children: N/A  . Years of Education: N/A   Occupational History  . Not on file.   Social History Main Topics  . Smoking status: Current Every Day Smoker -- 0.25 packs/day for 45 years    Types: Cigarettes    Start date: 04/04/1969  . Smokeless tobacco: Never Used     Comment: 1/2 pack or less. 1 pack to .5 pack per day  . Alcohol Use: No  . Drug Use: No  . Sexual Activity: No   Other Topics Concern  . Not on file   Social History Narrative   Formerly worked as a Scientist, water quality, now disabled.   Divorced.   2 grown children.   Lives with her grandson.    Review of Systems: Review of Systems  Constitutional: Negative for fever and chills.  Respiratory: Positive for cough, sputum production and shortness of breath. Negative for hemoptysis and wheezing.   Cardiovascular: Positive for chest pain. Negative for palpitations and leg swelling.  Gastrointestinal: Negative for nausea, vomiting, abdominal pain, diarrhea, constipation and blood in stool.  Genitourinary: Negative for dysuria and hematuria.  Musculoskeletal: Negative for myalgias, joint pain and falls.  Skin: Negative for rash.  Neurological: Negative for dizziness, tingling, tremors, weakness and headaches.     Physical Exam: Blood pressure 90/61, pulse 85, temperature 97.6 F (36.4  C), temperature source Oral, resp. rate 20, height _0  (1.651 m), weight 247 lb 9.2 oz (112.3 kg), SpO2 90 %. Physical Exam  Constitutional: She is oriented to person, place, and time. She appears well-developed and well-nourished. No distress.  Wearing non-rebreather  HENT:  Head: Normocephalic and atraumatic.  Eyes: EOM are normal. Pupils are equal, round, and reactive to light.  Cardiovascular: Normal rate, regular rhythm and intact distal pulses.   Distant heart sounds.  Pulmonary/Chest: Effort normal.  Difficult to assess as patient had difficulty sitting up, some basilar wheezing noted.  Abdominal: Soft. Bowel sounds are normal. There is no tenderness.  Musculoskeletal: She exhibits edema. She exhibits no tenderness.  Left ankle brace.  Neurological: She is alert and oriented to person, place, and time.  Skin: Skin is warm and dry.  Psychiatric: She has a normal mood and affect.     Lab results: Basic Metabolic Panel:  Recent Labs  03/03/15 1455  NA 138  K 3.6  CL 92*  CO2 40*  GLUCOSE 137*  BUN <5*  CREATININE 0.81  CALCIUM 8.4*   Liver Function Tests: No results for input(s): AST, ALT, ALKPHOS, BILITOT, PROT, ALBUMIN in the last 72 hours. No results for input(s): LIPASE, AMYLASE in the last 72 hours. No results for input(s): AMMONIA in the last 72 hours. CBC:  Recent Labs  03/03/15 1455  WBC 5.8  NEUTROABS 4.5  HGB 12.2  HCT 41.3  MCV 104.6*  PLT 251   Cardiac Enzymes: No results for input(s): CKTOTAL, CKMB, CKMBINDEX, TROPONINI in the last 72 hours. BNP: No results for input(s): PROBNP in the last 72 hours. D-Dimer: No results for input(s): DDIMER in the last 72 hours. CBG: No results for input(s): GLUCAP in the last 72 hours. Hemoglobin A1C: No results for input(s): HGBA1C in the last 72 hours. Fasting  Lipid Panel: No results for input(s): CHOL, HDL, LDLCALC, TRIG, CHOLHDL, LDLDIRECT in the last 72 hours. Thyroid Function Tests: No results  for input(s): TSH, T4TOTAL, FREET4, T3FREE, THYROIDAB in the last 72 hours. Anemia Panel: No results for input(s): VITAMINB12, FOLATE, FERRITIN, TIBC, IRON, RETICCTPCT in the last 72 hours. Coagulation: No results for input(s): LABPROT, INR in the last 72 hours. Urine Drug Screen: Drugs of Abuse     Component Value Date/Time   LABOPIA NONE DETECTED 07/01/2013 1218   LABOPIA NEGATIVE 02/04/2009 2315   COCAINSCRNUR NONE DETECTED 07/01/2013 1218   COCAINSCRNUR NEGATIVE 02/04/2009 2315   LABBENZ NONE DETECTED 07/01/2013 1218   LABBENZ NEGATIVE 02/04/2009 2315   AMPHETMU NONE DETECTED 07/01/2013 1218   AMPHETMU NEGATIVE 02/04/2009 2315   THCU NONE DETECTED 07/01/2013 1218   LABBARB NONE DETECTED 07/01/2013 1218    Alcohol Level: No results for input(s): ETH in the last 72 hours. Urinalysis: No results for input(s): COLORURINE, LABSPEC, PHURINE, GLUCOSEU, HGBUR, BILIRUBINUR, KETONESUR, PROTEINUR, UROBILINOGEN, NITRITE, LEUKOCYTESUR in the last 72 hours.  Invalid input(s): APPERANCEUR   Imaging results:  Dg Chest 2 View  03/03/2015  CLINICAL DATA:  Shortness of breath for several days. Right-sided chest pain for 1 day. History of lung carcinoma on the left. EXAM: CHEST  2 VIEW COMPARISON:  Chest radiograph February 15, 2015 and chest CT February 15, 2015 FINDINGS: There is persistent left lower lobe airspace consolidation. There is new patchy consolidation in medial right base. Lungs elsewhere clear. Heart is mildly enlarged but stable. Pulmonary vascularity is within normal limits. No adenopathy apparent. No bone lesions. IMPRESSION: Persistent left lower lobe airspace consolidation consistent with pneumonia. New patchy airspace consolidation medial right base. Stable cardiac prominence. Electronically Signed   By: Lowella Grip III M.D.   On: 03/03/2015 15:26    Other results: EKG: sinus rhythm, low voltage precordial leads, TWI V2-5, early R-wave transition.  Assessment & Plan by  Problem: Active Problems:   Coronary atherosclerosis   Severe chronic obstructive pulmonary disease (HCC)   Gastroesophageal reflux disease   Chronic diastolic CHF (congestive heart failure) (HCC)   Primary adenocarcinoma of left lung (HCC)   Hypercapnic respiratory failure, chronic (HCC)   Vitamin B12 deficiency neuropathy   Major depression (HCC)   SOB (shortness of breath)  Exacerbation of Severe COPD: 59 year old woman with PMH of Stage 2 NSCLC (completed radiation therapy 11/23), COPD (4L [place] at home, FEV1 42%, FEV1/FVC 88% 12/02/14), pulmonary HTN, CAD s/p DES 2010, CHF (EF 50-55%), BPPV, and B12 deficiency who presents with right sided chest pain and worsened shortness of breath. Differentials include COPD exacerbation, Post-obstructive PNA, CHF, ACS, or radiation pneumonitis. She does not have leukocytosis, purulent sputum, or fever which makes me think this is less likely a pneumonia in addition to her recent completed antibiotic course. She does have severe COPD which is easily exacerbated, which may be the most likely cause of her symptoms. Radiation pneumonitis is another consideration as she recently completed treatment for lung cancer. She was given Vancomycin and Zosyn in the ED for presumed HCAP, and Solu-Medrol for COPD. EKG does show TWI in V2-5 not seen on prior. Initial Troponin is 0.02. BNP elevated at 811 (baseline mid-100 or lower). -Hold antibiotics -Start Prednisone 40 mg tmrw -Albuterol, Duonebs, Dulera, Spiriva -Cardiac monitoring -Repeat EKG -Trend troponins -CBC, CMP -Pulse ox checks -monitor I/O -4L  with SpO2 88-92 %  GERD: Pravastatin 10 mg  CAD: Aspirin  Diet: Heart  Code: Full  Dispo: Disposition is deferred at this time, awaiting improvement of current medical problems. Anticipated discharge in approximately 1-3 day(s).   The patient does have a current PCP Oval Linsey, MD) and does need an [facility] hospital follow-up appointment after  discharge.  The patient does have transportation limitations that hinder transportation to clinic appointments.  Signed: Zada Finders, MD 03/03/2015, 8:11 PM

## 2015-03-03 NOTE — Progress Notes (Signed)
Pt admitted from ED to this unit at 6.45pm, vitals stable, tele monitoring started, pt is in comfortable position, Injection vancomycin is running right now,oxygen started at 4l/min via nasal canula, medicines and admission assessment is in progress, will continue to monitor the patient

## 2015-03-03 NOTE — ED Notes (Signed)
Attempted report.

## 2015-03-03 NOTE — ED Provider Notes (Signed)
CSN: [id number]     Arrival date & time 03/03/15  1428 History   None    Chief Complaint  Patient presents with  . Chest Pain  . Shortness of Breath    Patient is a 59 y.o. female presenting with chest pain. The history is provided by the patient.  Chest Pain Pain location:  R chest Pain quality: dull   Pain radiates to:  Does not radiate Pain radiates to the back: no   Onset quality:  Gradual Timing:  Constant Chronicity:  New Associated symptoms: cough and shortness of breath   Associated symptoms: no abdominal pain, no back pain, no dizziness, no fever, no headache, no nausea, no palpitations and not vomiting     Past Medical History  Diagnosis Date  . Coronary artery disease     S/P PCI of LAD with DES (12/2008). Total occlusion of RCA noted at that time., medically managed. ACS ruled out 03/2009 with Lexiscan myoview . Followed by [facility].  . Pulmonary hypertension ([place])     2-D Echo (04/6008) - Systolic pressure was moderately increased. PA peak pressure  51mHg. 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 Followed by Dr. WJoya Gaskins(Velora Heckler  . Diastolic dysfunction     2-D Echo (12/2008) - Normal LV Systolic funciton with EF 60-65%. Grade 1 diastolid dysfunction. No regional wall motion abnormalities. Moderate pulmonary HTN with PA peak pressure 553mg.  . Marland KitchenOPD (chronic obstructive pulmonary disease) (HCC)     Severe. Gold Stage IV.  PFTs (12/2008) - severe obstructive airway disease. Active tobacco use. Requires 4L O2 at home.  . Pulmonary nodule, right     Small right middle lobe nodule. Stable as of 12/2008.  . Marland Kitchenrediabetes     HgbA1c 6.4 (12/2008)  . Hx MRSA infection     Recurrent MRSA thigh abscesses.  . Obesity   . Hyperlipidemia   . GERD (gastroesophageal reflux disease)     S/P Nissen fundoplication.  . CHF (congestive heart failure) ([facility]  . On home oxygen  therapy since 2010    4L all the time  . Shortness of breath dyspnea     with a lot of exertion; if fluid builds up  . Pneumonia   . Depression   . Full dentures   . Chronic bronchitis ([facility]    "get it most years" (03/03/2015)  . Lung cancer ([facility]    "left"  . History of hiatal hernia   . Arthritis     "some scattered" (03/03/2015)   Past Surgical History  Procedure Laterality Date  . Nissen fundoplication    . Right heart catheterization N/A 11/09/2012    Procedure: RIGHT HEART CATH;  Surgeon: DaLarey DresserMD;  [facility];  Service: Cardiovascular;  Laterality: N/A;  . Appendectomy    . Tonsillectomy    . Back surgery    . Knee arthroscopy Right ~ 2000  . Tubal ligation  1979  . Video bronchoscopy with endobronchial ultrasound N/A 12/15/2014    Procedure: VIDEO BRONCHOSCOPY WITH ENDOBRONCHIAL ULTRASOUND;  Surgeon: PeIvin PootMD;  [place] [facility] Service: Thoracic;  Laterality: N/A;  . Coronary [facility] with stent placement  2010    [facility]  . Bilateral oophorectomy  1979  . Hernia repair    . Breast lumpectomy Right 1990's     (biopsy negative)  . Lumbar disc surgery  2001    "herniated discs; [facility], Dr. Lorin Mercy"  . Abdominal hysterectomy  1980's    "still have my cervix"   Family History  Problem Relation Age of Onset  . Heart disease Mother 72    Deceased from MI at 59yo  . Hypertension Mother   . Heart disease Father 39    Deceased of MI age 49yo  . Hypertension Father   . Hypertension Brother   . Lung cancer      Grandmother   Social History  Substance Use Topics  . Smoking status: Current Every Day Smoker -- 2.00 packs/day for 45 years    Types: Cigarettes    Start date: 04/04/1969  . Smokeless tobacco: Never Used  . Alcohol Use: No   OB History    No data available     Review of Systems  Constitutional: Negative for fever.  HENT: Negative for rhinorrhea and sore throat.   Eyes: Negative for visual  disturbance.  Respiratory: Positive for cough and shortness of breath. Negative for chest tightness.   Cardiovascular: Positive for chest pain. Negative for palpitations.  Gastrointestinal: Negative for nausea, vomiting, abdominal pain and constipation.  Genitourinary: Negative for dysuria and hematuria.  Musculoskeletal: Negative for back pain and neck pain.  Skin: Negative for rash.  Neurological: Negative for dizziness and headaches.  Psychiatric/Behavioral: Negative for confusion.  All other systems reviewed and are negative.   Allergies  Fluconazole and Atorvastatin  Home Medications   Prior to Admission medications   Medication Sig Start Date End Date Taking? Authorizing Provider  acetaminophen (TYLENOL) 500 MG tablet Take 500 mg by mouth every 6 (six) hours as needed for moderate pain.    Yes Historical Provider, MD  albuterol (PROVENTIL) (2.5 MG/3ML) 0.083% nebulizer solution Take 3 mLs (2.5 mg total) by nebulization every 6 (six) hours as needed for shortness of breath. 09/05/14  Yes Oval Linsey, MD  albuterol (VENTOLIN HFA) 108 (90 BASE) MCG/ACT inhaler Inhale 2 puffs into the lungs every 6 (six) hours as needed for shortness of breath. 12/05/14  Yes Oval Linsey, MD  aspirin 81 MG chewable tablet Chew 1 tablet (81 mg total) by mouth at bedtime. 01/16/15  Yes Oval Linsey, MD  Fluticasone-Salmeterol (ADVAIR DISKUS) 250-50 MCG/DOSE AEPB Inhale 1 puff into the lungs 2 (two) times daily. 09/05/14  Yes Tasrif Ahmed, MD  furosemide (LASIX) 20 MG tablet Take 20-40 mg by mouth daily as needed for fluid or edema.    Yes Historical Provider, MD  gabapentin (NEURONTIN) 300 MG capsule Take 1 capsule (300 mg total) by mouth 3 (three) times daily. Patient taking differently: Take 300-600 mg by mouth at bedtime.  01/16/15  Yes Oval Linsey, MD  lovastatin (MEVACOR) 40 MG tablet Take 1 tablet (40 mg total) by mouth at bedtime. 09/11/14 09/11/15 Yes Corky Sox, MD  Naproxen Sodium (ALEVE) 220  MG CAPS Take 220 mg by mouth 2 (two) times daily as needed (pain).   Yes Historical Provider, MD  pantoprazole (PROTONIX) 40 MG tablet Take 1 tablet (40 mg total) by mouth daily. 12/05/14  Yes Oval Linsey, MD  potassium chloride SA (K-DUR,KLOR-CON) 20 MEQ tablet Take 2 tablets (40 mEq total) by mouth daily as needed (Take when taking lasix). Patient taking differently: Take 20 mEq by mouth daily as needed (Take when taking lasix).  01/16/15  Yes Oval Linsey, MD  Probiotic Product (PROBIOTIC DAILY PO) Take 1 capsule by mouth daily.   Yes Historical Provider, MD  tiotropium (SPIRIVA HANDIHALER) 18 MCG inhalation capsule Place 1 capsule (18 mcg total) into inhaler and inhale daily. 09/05/14  Yes Tasrif Ahmed, MD  furosemide (LASIX) 40 MG tablet Take 1 tablet (40 mg total) by mouth daily as needed for fluid. Patient not taking: Reported on 03/03/2015 01/16/15   Oval Linsey, MD  levofloxacin (LEVAQUIN) 750 MG tablet Take 1 tablet (750 mg total) by mouth daily. Patient not taking: Reported on 03/03/2015 02/18/15   Zada Finders, MD   BP 104/75 mmHg  Pulse 95  Temp(Src) 98.1 F (36.7 C) (Oral)  Resp 27  SpO2 92% Physical Exam  Constitutional: She is oriented to person, place, and time. She appears well-developed and well-nourished. No distress.  HENT:  Head: Normocephalic and atraumatic.  Mouth/Throat: Oropharynx is clear and moist.  Eyes: EOM are normal. Pupils are equal, round, and reactive to light.  Neck: Neck supple. No JVD present.  Cardiovascular: Normal rate, regular rhythm, normal heart sounds and intact distal pulses.  Exam reveals no gallop.   No murmur heard. Pulmonary/Chest: Effort normal. She has decreased breath sounds (bibasilar). She has wheezes (bilateral). She has rales (bilateral).  Abdominal: Soft. She exhibits no distension. There is no tenderness.  Musculoskeletal: Normal range of motion. She exhibits no tenderness.  Neurological: She is alert and oriented to person,  place, and time. No cranial nerve deficit. She exhibits normal muscle tone.  Skin: Skin is warm and dry. No rash noted.  Psychiatric: Her behavior is normal.    ED Course  Procedures  None   Labs Review Labs Reviewed  BASIC METABOLIC PANEL - Abnormal; Notable for the following:    Chloride 92 (*)    CO2 40 (*)    Glucose, Bld 137 (*)    BUN <5 (*)    Calcium 8.4 (*)    All other components within normal limits  CBC WITH DIFFERENTIAL/PLATELET - Abnormal; Notable for the following:    MCV 104.6 (*)    MCHC 29.5 (*)    All other components within normal limits  BRAIN NATRIURETIC PEPTIDE - Abnormal; Notable for the following:    B Natriuretic Peptide 811.1 (*)    All other components within normal limits  TROPONIN I  TROPONIN I  COMPREHENSIVE METABOLIC PANEL  CBC WITH DIFFERENTIAL/PLATELET  I-STAT TROPOININ, ED  I-STAT CG4 LACTIC ACID, ED    Imaging Review Dg Chest 2 View  03/03/2015  CLINICAL DATA:  Shortness of breath for several days. Right-sided chest pain for 1 day. History of lung carcinoma on the left. EXAM: CHEST  2 VIEW COMPARISON:  Chest radiograph February 15, 2015 and chest CT February 15, 2015 FINDINGS: There is persistent left lower lobe airspace consolidation. There is new patchy consolidation in medial right base. Lungs elsewhere clear. Heart is mildly enlarged but stable. Pulmonary vascularity is within normal limits. No adenopathy apparent. No bone lesions. IMPRESSION: Persistent left lower lobe airspace consolidation consistent with pneumonia. New patchy airspace consolidation medial right base. Stable cardiac prominence. Electronically Signed   By: Lowella Grip III M.D.   On: 03/03/2015 15:26   I have personally reviewed and evaluated these images and lab results as part of my medical decision-making.    EKG Interpretation   Date/Time:  Tuesday March 03 2015 14:32:36 EST Ventricular Rate:  87 PR Interval:  129 QRS Duration: 94 QT Interval:   409 QTC Calculation: 492 R Axis:   106 Text Interpretation:  Sinus rhythm Atrial premature complexes Right axis  deviation Low voltage,  precordial leads Abnormal R-wave progression, early  transition Abnormal T, consider ischemia, diffuse leads Confirmed by KNOTT  MD, DANIEL [phone]) on 03/03/2015 2:36:59 PM      MDM   Final diagnoses:  HCAP (healthcare-associated pneumonia)  Chronic congestive heart failure, unspecified congestive heart failure type [facility])     59 yo F with a PMH of CAD s/p DES in LAD in 2010, pHTN, chronic diastolic HF, COPD on 4L [place] at home, and left lung adenocarcinoma on radiation who presents with SOB and right sided chest pain. Symptoms have been ongoing x 1 week but got worse today. Chest pain does not radiate. Constant, 8/10, dull in nature. Received ASA 325 mg and NTG tablets which dropped her BP. DDx includes postobstructive PNA, ACS, pleural effusion, COPD exacerbation, CHF exacerbation. Will get appropriate labs and imaging.   I interpreted the EKG. NSR, rate 87,k new TWI in lateral and septal leads compared to EKG 02/15/15. No ST elevation or depression, no ectopy, no BBB.   I independently reviewed and interpreted available labs and imaging studies. CXR with persistent LLL PNA and new RLL PNA. Troponin negative. Will treat for HCAP. Admit for further management of PNA and ACS workup.  Discussed with Dr. Ashok Cordia.  Gustavus Bryant, MD 03/03/15 [place], MD 03/04/15 [phone]

## 2015-03-04 DIAGNOSIS — I509 Heart failure, unspecified: Secondary | ICD-10-CM | POA: Insufficient documentation

## 2015-03-04 DIAGNOSIS — C3492 Malignant neoplasm of unspecified part of left bronchus or lung: Secondary | ICD-10-CM

## 2015-03-04 DIAGNOSIS — E538 Deficiency of other specified B group vitamins: Secondary | ICD-10-CM

## 2015-03-04 DIAGNOSIS — F322 Major depressive disorder, single episode, severe without psychotic features: Secondary | ICD-10-CM

## 2015-03-04 DIAGNOSIS — J9612 Chronic respiratory failure with hypercapnia: Secondary | ICD-10-CM

## 2015-03-04 DIAGNOSIS — I2510 Atherosclerotic heart disease of native coronary artery without angina pectoris: Secondary | ICD-10-CM

## 2015-03-04 DIAGNOSIS — J449 Chronic obstructive pulmonary disease, unspecified: Secondary | ICD-10-CM

## 2015-03-04 DIAGNOSIS — K219 Gastro-esophageal reflux disease without esophagitis: Secondary | ICD-10-CM

## 2015-03-04 DIAGNOSIS — I5032 Chronic diastolic (congestive) heart failure: Secondary | ICD-10-CM

## 2015-03-04 LAB — COMPREHENSIVE METABOLIC PANEL
ALT: 7 U/L — AB (ref 14–54)
AST: 17 U/L (ref 15–41)
Albumin: 2.3 g/dL — ABNORMAL LOW (ref 3.5–5.0)
Alkaline Phosphatase: 173 U/L — ABNORMAL HIGH (ref 38–126)
Anion gap: 5 (ref 5–15)
BILIRUBIN TOTAL: 0.7 mg/dL (ref 0.3–1.2)
BUN: <5 mg/dL — ABNORMAL LOW (ref 6–20)
CHLORIDE: 91 mmol/L — AB (ref 101–111)
CO2: 42 mmol/L — ABNORMAL HIGH (ref 22–32)
CREATININE: 0.96 mg/dL (ref 0.44–1.00)
Calcium: 8.4 mg/dL — ABNORMAL LOW (ref 8.9–10.3)
Glucose, Bld: 215 mg/dL — ABNORMAL HIGH (ref 65–99)
Potassium: 4.4 mmol/L (ref 3.5–5.1)
Sodium: 138 mmol/L (ref 135–145)
TOTAL PROTEIN: 5.9 g/dL — AB (ref 6.5–8.1)

## 2015-03-04 LAB — CBC WITH DIFFERENTIAL/PLATELET
BASOS ABS: 0 10*3/uL (ref 0.0–0.1)
BASOS PCT: 0 %
EOS ABS: 0 10*3/uL (ref 0.0–0.7)
EOS PCT: 0 %
HCT: 43.4 % (ref 36.0–46.0)
HEMOGLOBIN: 13.2 g/dL (ref 12.0–15.0)
LYMPHS ABS: 0.1 10*3/uL — AB (ref 0.7–4.0)
Lymphocytes Relative: 2 %
MCH: 31.7 pg (ref 26.0–34.0)
MCHC: 30.4 g/dL (ref 30.0–36.0)
MCV: 104.3 fL — ABNORMAL HIGH (ref 78.0–100.0)
Monocytes Absolute: 0 10*3/uL — ABNORMAL LOW (ref 0.1–1.0)
Monocytes Relative: 0 %
NEUTROS PCT: 97 %
Neutro Abs: 5.1 10*3/uL (ref 1.7–7.7)
PLATELETS: 221 10*3/uL (ref 150–400)
RBC: 4.16 MIL/uL (ref 3.87–5.11)
RDW: 14.3 % (ref 11.5–15.5)
WBC: 5.2 10*3/uL (ref 4.0–10.5)

## 2015-03-04 LAB — GLUCOSE, CAPILLARY
GLUCOSE-CAPILLARY: 207 mg/dL — AB (ref 65–99)
GLUCOSE-CAPILLARY: 187 mg/dL — AB (ref 65–99)
GLUCOSE-CAPILLARY: 266 mg/dL — AB (ref 65–99)
GLUCOSE-CAPILLARY: 253 mg/dL — AB (ref 65–99)

## 2015-03-04 LAB — TROPONIN I

## 2015-03-04 MED ORDER — ENOXAPARIN SODIUM 60 MG/0.6ML ~~LOC~~ SOLN
60.0000 mg | SUBCUTANEOUS | Status: DC
  Administered 2015-03-04 – 2015-03-05 (×2): 60 mg via SUBCUTANEOUS
  Filled 2015-03-04 (×2): qty 0.6

## 2015-03-04 MED ORDER — CETYLPYRIDINIUM CHLORIDE 0.05 % MT LIQD
7.0000 mL | Freq: Two times a day (BID) | OROMUCOSAL | Status: DC
  Administered 2015-03-04 – 2015-03-06 (×5): 7 mL via OROMUCOSAL

## 2015-03-04 MED ORDER — GABAPENTIN 300 MG PO CAPS
300.0000 mg | ORAL_CAPSULE | Freq: Every day | ORAL | Status: DC
  Administered 2015-03-05: 300 mg via ORAL
  Filled 2015-03-04: qty 1

## 2015-03-04 MED ORDER — INSULIN ASPART 100 UNIT/ML ~~LOC~~ SOLN
0.0000 [IU] | Freq: Three times a day (TID) | SUBCUTANEOUS | Status: DC
  Administered 2015-03-04 – 2015-03-05 (×2): 5 [IU] via SUBCUTANEOUS
  Administered 2015-03-05: 3 [IU] via SUBCUTANEOUS
  Administered 2015-03-05: 1 [IU] via SUBCUTANEOUS

## 2015-03-04 MED ORDER — GABAPENTIN 300 MG PO CAPS
300.0000 mg | ORAL_CAPSULE | Freq: Once | ORAL | Status: AC
  Administered 2015-03-04: 300 mg via ORAL
  Filled 2015-03-04: qty 1

## 2015-03-04 MED ORDER — IPRATROPIUM-ALBUTEROL 0.5-2.5 (3) MG/3ML IN SOLN
3.0000 mL | Freq: Four times a day (QID) | RESPIRATORY_TRACT | Status: DC
  Administered 2015-03-05 – 2015-03-06 (×5): 3 mL via RESPIRATORY_TRACT
  Filled 2015-03-04 (×5): qty 3

## 2015-03-04 MED ORDER — INSULIN ASPART 100 UNIT/ML ~~LOC~~ SOLN
0.0000 [IU] | Freq: Every day | SUBCUTANEOUS | Status: DC
  Administered 2015-03-04: 3 [IU] via SUBCUTANEOUS
  Administered 2015-03-05: 2 [IU] via SUBCUTANEOUS

## 2015-03-04 NOTE — Progress Notes (Signed)
   Subjective: Patient feels her breathing is improved today on Nasal [place]. She was able to speak to [place] without trouble while laying down in bed and sitting up. She does have some continued shortness of breath, but was able to walk a little bit. She denies any current chest pain.  Objective: Vital signs in last 24 hours: Filed Vitals:   03/04/15 0646 03/04/15 0735 03/04/15 1137 03/04/15 1218  BP:    98/61  Pulse:    67  Temp:    99 F (37.2 C)  TempSrc:    Oral  Resp:    20  Height:      Weight:      SpO2: 93% 90% 91% 90%   Weight change:   Intake/Output Summary (Last 24 hours) at 03/04/15 1647 Last data filed at 03/04/15 1506  Gross per 24 hour  Intake    945 ml  Output   1800 ml  Net   -855 ml   General: resting in bed HEENT: PERRL, EOMI, no scleral icterus Cardiac: RRR, no rubs, murmurs or gallops Pulm: mild basilar crackles heard Abd: soft, nontender Ext: +1 pedal edema bilaterally   Assessment/Plan: Principal Problem:   Severe chronic obstructive pulmonary disease (HCC) Active Problems:   Coronary atherosclerosis   Gastroesophageal reflux disease   Chronic diastolic CHF (congestive heart failure) (HCC)   Primary adenocarcinoma of left lung (HCC)   Hypercapnic respiratory failure, chronic (HCC)   Vitamin B12 deficiency neuropathy   Major depression (HCC)   SOB (shortness of breath)  [name] is a 59 year old woman with PMH of Stage 2 NSCLC (completed radiation therapy 11/23), COPD (4L [place] at home, FEV1 42%, FEV1/FVC 88% 12/02/14), pulmonary HTN, CAD s/p DES 2010, CHF (EF 50-55%), BPPV, and B12 deficiency who presents with right sided chest pain and worsened shortness of breath  Exacerbation of Severe COPD: Patient presented with worsened dyspnea, cough with minimal clear sputum production, and right sided chest pain. She does not have leukocytosis, purulent sputum, or fever which makes me think this is less likely a pneumonia in addition to her recent  completed antibiotic course. Her symptoms are improved today after dose of IV Solu-Medrol yesterday and oral Prednisone today. She is on home oxygen 4L continuous via [place]. Currently satting b/w 88-92% on 5L [place]. We will continue to monitor overnight and try to ween down to home 4L. -Continue oral Prednisone for 5 day total course -Ween to 4L [place] with SpO2 88-92 % -Albuterol, Duonebs, Dulera, Spiriva  Chest Pain: Patient presented with right sided chest pain which did not radiate, was dull, constant, and worse with inspiration and palpation. Her EKG did show TWI in V2-V5 on arrival and was seen again on repeat EKG today. Troponins have been negative. She does not have anymore chest pain today. Her pain is unlikely to be ACS, probably musculoskeletal in nature. T wave inversions may be due to demand, but unsure of cause. -Repeat EKG tomorrow morning  Dispo: Disposition is deferred at this time, awaiting improvement of current medical problems.  Anticipated discharge in approximately 1 day(s).   The patient does have a current PCP Oval Linsey, MD) and does need an [facility] hospital follow-up appointment after discharge.  The patient does have transportation limitations that hinder transportation to clinic appointments.    LOS: 1 day   Zada Finders, MD 03/04/2015, 4:47 PM

## 2015-03-04 NOTE — Progress Notes (Signed)
Inpatient Diabetes Program Recommendations  AACE/ADA: New Consensus Statement on Inpatient Glycemic Control (2015)  Target Ranges:  Prepandial:   less than 140 mg/dL      Peak postprandial:   less than 180 mg/dL (1-2 hours)      Critically ill patients:  140 - 180 mg/dL   Review of Glycemic Control  Diabetes history: "Pre-diabetes" Outpatient Diabetes medications: None noted Current orders for Inpatient glycemic control: No meds ordered  Inpatient Diabetes Program Recommendations:    While on the prednisone, please add a mild correction tidwc. Sensitive correction tidwc.  Thank you Rosita Kea, RN, MSN, CDE  Diabetes Inpatient Program Office: [phone] Pager: [phone] 8:00 am to 5:00 pm

## 2015-03-04 NOTE — Consult Note (Signed)
   [facility] CM Inpatient Consult   03/04/2015  [name] 10/09/1955 [id number] Patient was assessed for [facility] [employer] for community services through her [place] [employer]. Met with patient at length.  Patient states she is struggling with finances.  She is currently in the process of moving from her current residence on 03/13/15 to an apartment, temporarily with a roommate and her grandson Charm Rings doesn't really want to move] she says.  "There is a lot of stress right now."   She states she has applied for an apartment in her daughter's complex but, she is on a waiting list for this, but at the top of the list she was told by that apartment manager.  She states she feels that she has been progressive with her Radiation Oncology treatment which started in August and completed the treatments recently and feels better with the outcome, so far. She states, "I just can't seem to get rid of this pneumonia."   She verbalizes struggles financially with her grandson.  Patient was previously active with [facility]  With community nursing and social work and she states she was "let go" because she "wasn't doing what was needed at the time". Will follow up with [facility] leaders to check for restart of services.  Will continue to follow up, as appropriate.  For questions, please contact: Natividad Brood, RN BSN [facility] Liaison  [phone] business mobile phone

## 2015-03-04 NOTE — Progress Notes (Signed)
Pt O2 sat down to 85% on 4Lnc overnight, pt increased to 6Lnc. O2 sat parameters clarified with MD on call, new orders given. Pt also with mild confusion overnight, per NT pt stated her name was [name] and could not remember where she was. When RN assessed pt, pt A/Ox4. MD notified of this as well.

## 2015-03-05 ENCOUNTER — Other Ambulatory Visit: Payer: Self-pay

## 2015-03-05 LAB — BASIC METABOLIC PANEL
Anion gap: 3 — ABNORMAL LOW (ref 5–15)
BUN: 8 mg/dL (ref 6–20)
CO2: 44 mmol/L — ABNORMAL HIGH (ref 22–32)
CREATININE: 0.81 mg/dL (ref 0.44–1.00)
Calcium: 8.4 mg/dL — ABNORMAL LOW (ref 8.9–10.3)
Chloride: 93 mmol/L — ABNORMAL LOW (ref 101–111)
GFR calc Af Amer: >60 mL/min (ref >60)
Glucose, Bld: 173 mg/dL — ABNORMAL HIGH (ref 65–99)
Potassium: 4.1 mmol/L (ref 3.5–5.1)
SODIUM: 140 mmol/L (ref 135–145)

## 2015-03-05 LAB — GLUCOSE, CAPILLARY
GLUCOSE-CAPILLARY: 150 mg/dL — AB (ref 65–99)
GLUCOSE-CAPILLARY: 290 mg/dL — AB (ref 65–99)
Glucose-Capillary: 216 mg/dL — ABNORMAL HIGH (ref 65–99)
Glucose-Capillary: 246 mg/dL — ABNORMAL HIGH (ref 65–99)

## 2015-03-05 MED ORDER — ACETAMINOPHEN 325 MG PO TABS
650.0000 mg | ORAL_TABLET | Freq: Four times a day (QID) | ORAL | Status: DC | PRN
  Administered 2015-03-05: 650 mg via ORAL
  Filled 2015-03-05: qty 2

## 2015-03-05 NOTE — Progress Notes (Signed)
SATURATION QUALIFICATIONS: (This note is used to comply with regulatory documentation for home oxygen)  Patient Saturations on 5 L [place] at Rest = 90%  Patient Saturations on 5 L [place] while Ambulating = 75%

## 2015-03-05 NOTE — Consult Note (Signed)
   [facility] CM Inpatient Consult   03/05/2015  [name] Sep 21, 1955 [id number]  Follow up with patient for restart of [facility] services.  Patient states, "I am so relieved to know that my oncology people gave me good news yesterday regarding my radiation has worked on the lung cancer.  I feel like if I can just get into a better living situation, I will get better.  I feel like I could really use the follow up with [facility] again." Follow up with [facility] Assistant clinical director, regarding restart of services needs. Consent form signed and folder given.  Patient will receive post follow up and be evaluated for home visits.  Patient's best contact is her phone and her grandson, Beverely Low, has access to this number as well at [phone].  Patient aware that [facility] does not replace or interfere with any services needed or arranged by the inpatient care management staff.  For questions, please contact: Natividad Brood, RN BSN [facility] Liaison  [phone] business mobile phone

## 2015-03-05 NOTE — Progress Notes (Signed)
Inpatient Diabetes Program Recommendations  AACE/ADA: New Consensus Statement on Inpatient Glycemic Control (2015)  Target Ranges:  Prepandial:   less than 140 mg/dL      Peak postprandial:   less than 180 mg/dL (1-2 hours)      Critically ill patients:  140 - 180 mg/dL   Review of Glycemic Control  Inpatient Diabetes Program Recommendations:    May want to order the combination diet of carbohydrate modified/heart healthy diet rather than just the heart healthy diet (has high carbohydrate content)  Thank you Rosita Kea, RN, MSN, CDE  Diabetes Inpatient Program Office: [phone] Pager: [phone] 8:00 am to 5:00 pm

## 2015-03-05 NOTE — Care Management Note (Signed)
Case Management Note  Patient Details  Name: [name] MRN: [id number] Date of Birth: 23-Oct-1955  Subjective/Objective:     Admitted with COPD               Action/Plan: Patient lives with her grandson, has home 02, nebulizer machine, walker. Patient could benefit from a Disease Management Program for COPD but patient refused all services. Patient stated that she is moving to a new apartment and doe not have to time to deal with any HHC. CM informed patient that if she changed her mind, her PCP could make [place] arrangements from his office. Patient has private insurance with [employer] and is on the [place] program for medication assistance; Pharmacy of choice is [facility], patient states no problem getting her medication. CM will continue to follow for DCP.  Expected Discharge Date:    possibly 03/06/2015              Expected Discharge Plan:  Home/Self Care  Choice offered to:  Patient  HH Arranged:  Patient Refused  Status of Service:  In process, will continue to follow  Sherrilyn Rist [phone] 03/05/2015, 3:18 PM

## 2015-03-05 NOTE — Progress Notes (Signed)
Subjective: Patient sleeping comfortably on [place].  Upon awakening, she feels her breathing is improved today on [place] and she is ready to go home.  She has not walked in the hallway, but she has walked to/from bathroom.  She denies continued chest pain.  Objective: Vital signs in last 24 hours: Filed Vitals:   03/04/15 2051 03/05/15 0031 03/05/15 0539 03/05/15 0737  BP: 105/56 111/43 114/75 117/73  Pulse: 86 74 81 80  Temp: 98.2 F (36.8 C) 98 F (36.7 C) 98.5 F (36.9 C)   TempSrc: Oral Oral Oral   Resp: _0 Height:      Weight:   247 lb 4.8 oz (112.175 kg)   SpO2: 92% 90% 93% 93%   Weight change: -15 lb 0.8 oz (-6.826 kg)  Intake/Output Summary (Last 24 hours) at 03/05/15 0912 Last data filed at 03/05/15 0540  Gross per 24 hour  Intake    960 ml  Output   2350 ml  Net  -1390 ml   General: resting in bed HEENT: PERRL, EOMI, no scleral icterus Cardiac: RRR, no rubs, murmurs or gallops Pulm: No use of accessory muscles or increased WOB. No wheezing or crackles.  Minimally decreased breath sounds in LLL.  Decreased air movement throughout. Abd: soft, nontender Ext: +1 pedal edema bilaterally   Assessment/Plan: Principal Problem:   Severe chronic obstructive pulmonary disease (HCC) Active Problems:   Coronary atherosclerosis   Gastroesophageal reflux disease   Chronic diastolic CHF (congestive heart failure) (HCC)   Primary adenocarcinoma of left lung (HCC)   Hypercapnic respiratory failure, chronic (HCC)   Vitamin B12 deficiency neuropathy   Major depression (HCC)   SOB (shortness of breath)   Chronic congestive heart failure [facility])  Ms. Erionna Strum is a 59 year old woman with PMH of Stage 2 NSCLC (completed radiation therapy 11/23), COPD (4L [place] at home, FEV1 42%, FEV1/FVC 88% 12/02/14), pulmonary HTN, CAD s/p DES 2010, CHF (EF 50-55%), BPPV, and B12 deficiency who presents with right sided chest pain and worsened shortness of breath  Exacerbation of Severe  COPD: She is on home oxygen 4L continuous via [place].  Patient presented with worsened dyspnea, cough with minimal clear sputum production, and right sided chest pain. CXR shows LLL consolidation thought to be residual NSCLC vs scarring s/p radiation therapy.  She does not have leukocytosis, purulent sputum, or fever which makes me think this is less likely a pneumonia in addition to her recent completed antibiotic course. Her symptoms continue to improve after dose of IV Solu-Medrol and Prednisone. Currently satting b/w 90-93% on 5L [place]. However, she desatted to 75% on 5L [place] and complained of dizziness and HA while ambulating. - Continue oral Prednisone for 5 day total course - Wean to 4L [place] with SpO2 88-92 % - Albuterol, Duonebs, Dulera, Spiriva - PT/OT/IS  H/o Stage 2a NSCLC s/p radiation therapy completed 11/23: Followed by Dr. Pablo Ledger ([facility]). - CTM  Chest Pain, resolved: Patient presented with right sided chest pain which did not radiate, was dull, constant, and worse with inspiration and palpation. Her EKG did show TWI in V2-V5 on arrival and was seen again on repeat ECG yesterday and today. Troponins have been negative. She does not have anymore chest pain today. Her pain is unlikely to be ACS, probably musculoskeletal in nature. T wave inversions may be due to demand, but unsure of cause. - ECG unchanged from baseline with resolution of TWI in V6.  Dispo:  Anticipated discharge in approximately 1 day(s).   The patient does have a current PCP Oval Linsey, MD) and does need an [facility] hospital follow-up appointment after discharge.  The patient does have transportation limitations that hinder transportation to clinic appointments.    LOS: 2 days   Iline Oven, MD 03/05/2015, 9:12 AM

## 2015-03-05 NOTE — Progress Notes (Deleted)
EKG COMPLETED. SEE PAPER CHART.

## 2015-03-05 NOTE — Discharge Summary (Signed)
Name: [name] MRN: [id number] DOB: [birth year] 59 y.o. PCP: Oval Linsey, MD  Date of Admission: 03/03/2015  2:28 PM Date of Discharge: 03/06/2015 Attending Physician: Annia Belt, MD  Discharge Diagnosis: 1. Exacerbation of Severe COPD   Principal Problem:   Severe chronic obstructive pulmonary disease (HCC) Active Problems:   Coronary atherosclerosis   Gastroesophageal reflux disease   Chronic diastolic CHF (congestive heart failure) (HCC)   Primary adenocarcinoma of left lung (HCC)   Hypercapnic respiratory failure, chronic (HCC)   Vitamin B12 deficiency neuropathy   Major depression (HCC)   SOB (shortness of breath)   Chronic congestive heart failure ([place])  Discharge Medications:   Medication List    STOP taking these medications        levofloxacin 750 MG tablet  Commonly known as:  LEVAQUIN      TAKE these medications        acetaminophen 500 MG tablet  Commonly known as:  TYLENOL  Take 500 mg by mouth every 6 (six) hours as needed for moderate pain.     albuterol (2.5 MG/3ML) 0.083% nebulizer solution  Commonly known as:  PROVENTIL  Take 3 mLs (2.5 mg total) by nebulization every 6 (six) hours as needed for shortness of breath.     albuterol 108 (90 BASE) MCG/ACT inhaler  Commonly known as:  VENTOLIN HFA  Inhale 2 puffs into the lungs every 6 (six) hours as needed for shortness of breath.     ALEVE 220 MG Caps  Generic drug:  Naproxen Sodium  Take 220 mg by mouth 2 (two) times daily as needed (pain).     aspirin 81 MG chewable tablet  Chew 1 tablet (81 mg total) by mouth at bedtime.     Fluticasone-Salmeterol 250-50 MCG/DOSE Aepb  Commonly known as:  ADVAIR DISKUS  Inhale 1 puff into the lungs 2 (two) times daily.     furosemide 40 MG tablet  Commonly known as:  LASIX  Take 1 tablet (40 mg total) by mouth daily as needed for fluid.     gabapentin 300 MG capsule  Commonly known as:  NEURONTIN  Take 1 capsule (300 mg total) by  mouth 3 (three) times daily.     lovastatin 40 MG tablet  Commonly known as:  MEVACOR  Take 1 tablet (40 mg total) by mouth at bedtime.     pantoprazole 40 MG tablet  Commonly known as:  PROTONIX  Take 1 tablet (40 mg total) by mouth daily.     pantoprazole 40 MG tablet  Commonly known as:  PROTONIX  Take 1 tablet (40 mg total) by mouth daily.     potassium chloride SA 20 MEQ tablet  Commonly known as:  K-DUR,KLOR-CON  Take 2 tablets (40 mEq total) by mouth daily as needed (Take when taking lasix).     predniSONE 20 MG tablet  Commonly known as:  DELTASONE  Take 2 tablets (40 mg total) by mouth daily with breakfast.     PROBIOTIC DAILY PO  Take 1 capsule by mouth daily.     tiotropium 18 MCG inhalation capsule  Commonly known as:  SPIRIVA HANDIHALER  Place 1 capsule (18 mcg total) into inhaler and inhale daily.        Disposition and follow-up:   Ms.Larina B Wengert was discharged from [facility] in Stable condition.  At the hospital follow up visit please address:  1.  Continued chest pain, continued SOB, medication adherence  2.  Labs / imaging needed at time of follow-up: CXR 4 weeks post discharge for resolution of LLL consolidation  3.  Pending labs/ test needing follow-up: none  Follow-up Appointments: Follow-up Information    Follow up with Karren Cobble, MD On 03/16/2015.   Specialty:  Internal Medicine   Why:  9:45a   Contact information:   [address] [place] [address] [phone]       Discharge Instructions: Discharge Instructions    Call MD for:  difficulty breathing, headache or visual disturbances    Complete by:  As directed      Call MD for:  persistant dizziness or light-headedness    Complete by:  As directed      Diet - low sodium heart healthy    Complete by:  As directed      Increase activity slowly    Complete by:  As directed            Consultations:    Procedures Performed:  Dg Chest 2  View  03/03/2015  CLINICAL DATA:  Shortness of breath for several days. Right-sided chest pain for 1 day. History of lung carcinoma on the left. EXAM: CHEST  2 VIEW COMPARISON:  Chest radiograph February 15, 2015 and chest CT February 15, 2015 FINDINGS: There is persistent left lower lobe airspace consolidation. There is new patchy consolidation in medial right base. Lungs elsewhere clear. Heart is mildly enlarged but stable. Pulmonary vascularity is within normal limits. No adenopathy apparent. No bone lesions. IMPRESSION: Persistent left lower lobe airspace consolidation consistent with pneumonia. New patchy airspace consolidation medial right base. Stable cardiac prominence. Electronically Signed   By: Lowella Grip III M.D.   On: 03/03/2015 15:26   Dg Chest 2 View  02/15/2015  CLINICAL DATA:  Increased shortness of breath, left lower lobe mass EXAM: CHEST  2 VIEW COMPARISON:  01/27/2015 FINDINGS: Cardiomegaly evident with chronic vascular congestion. Large left lower lobe masslike opacity persists, better demonstrated on the CT comparison. Right lung remains clear. No superimposed edema or pneumothorax. Trachea midline. IMPRESSION: Cardiomegaly with vascular congestion Left lower lobe masslike opacity, stable to minimal improvement by plain radiography. Patient is undergoing radiation. Electronically Signed   By: Jerilynn Mages.  Shick M.D.   On: 02/15/2015 14:40   Dg Tibia/fibula Left  02/15/2015  CLINICAL DATA:  Pt reports 45 minutes PTA she was stepping down step from bathroom and left foot/leg gave out,laid on floor for 20 minutes before grandson got up to help her off floor. Pain to left ankle, unable to bear full weight on left side. Pt reports she has had increase in shortness of breath with rest. Pt usually only short of breath with exertion EXAM: LEFT TIBIA AND FIBULA - 2 VIEW COMPARISON:  None. FINDINGS: No convincing fracture. No bone lesion. Knee and ankle joints are normally aligned. There is  diffuse subcutaneous soft tissue edema throughout the leg most prominently involving the distal leg and ankle. IMPRESSION: No fracture or dislocation. Electronically Signed   By: Lajean Manes M.D.   On: 02/15/2015 14:37   Dg Ankle Complete Left  02/15/2015  CLINICAL DATA:  Fall when stepping down from bathroom with left ankle pain. Unable to bear weight. EXAM: LEFT ANKLE COMPLETE - 3+ VIEW COMPARISON:  07/01/2013 and 10/24/2011 FINDINGS: Moderate soft tissue swelling over the ankle. Ankle mortise is within normal. Findings suggesting an old distal fibular fracture. No acute fracture or dislocation. Minimal degenerative change over the midfoot/hindfoot. Spurring over the posterior  and inferior calcaneus. IMPRESSION: No acute fracture. Electronically Signed   By: Marin Olp M.D.   On: 02/15/2015 14:37   Ct Angio Chest Pe W/cm &/or Wo Cm  02/15/2015  ADDENDUM REPORT: 02/15/2015 18:11 ADDENDUM: The previously noted left lower lobe lung mass has increased in size, from 5.5 cm to 6.3 cm. Surrounding new lower attenuation airspace consolidation most likely reflects postobstructive pneumonia. Electronically Signed   By: Garald Balding M.D.   On: 02/15/2015 18:11  02/15/2015  CLINICAL DATA:  History of lung carcinoma with increasing shortness of Breath EXAM: CT ANGIOGRAPHY CHEST WITH CONTRAST TECHNIQUE: Multidetector CT imaging of the chest was performed using the standard protocol during bolus administration of intravenous contrast. Multiplanar CT image reconstructions and MIPs were obtained to evaluate the vascular anatomy. CONTRAST:  55m OMNIPAQUE IOHEXOL 350 MG/ML SOLN COMPARISON:  Chest x-ray from earlier in the same day FINDINGS: Lungs are well aerated bilaterally. Left lower lobe pneumonia is identified with dense consolidation. No other focal infiltrate is noted. No sizable parenchymal lesions are seen. The thoracic inlet is within normal limits. Calcific changes of the thoracic aorta are noted without  aneurysmal dilatation or dissection. The pulmonary artery demonstrates a normal branching pattern. No filling defects to suggest pulmonary emboli are identified. No significant hilar or mediastinal adenopathy is noted. Upper abdomen and bony structures are within normal limits. Postsurgical changes are noted about the gastroesophageal junction. Review of the MIP images confirms the above findings. IMPRESSION: Left lower lobe pneumonia. No evidence of pulmonary embolism. Electronically Signed: By: MInez CatalinaM.D. On: 02/15/2015 17:03    2D Echo: none  Cardiac Cath: none  Admission HPI: Ms. CCaterina Racineis a 59year old woman with PMH of Stage 2 NSCLC (completed radiation therapy 11/23), COPD (4L [place] at home, FEV1 42%, FEV1/FVC 88% 12/02/14), pulmonary HTN, CAD s/p DES 2010, CHF (EF 50-55%), BPPV, and B12 deficiency who presents with right sided chest pain and worsened shortness of breath. She has baseline shortness of breath, but states that this morning her dyspnea worsened to the point she needed to call EMS. She thinks her O2 sat was in the low 80s at that time. She also reports right sided chest pain which does not radiate, is dull, constant, and worse with inspiration and palpation. She has associated increase in cough productive of clear sputum, but no fever, chills, or purulent sputum. She denies any orthopnea, peripheral edema, change in weight, N/V/D/C.  She was recently hospitalized from 11/13-//16 for presumed post-obstructive pneumonia and completed a week of antibiotics. At that time she had similar presentation without obvious sign of infection. She completed her radiation treatment on 11/23 and denies ill effects after her sessions other than fatigue.  She was given Vancomycin and Zosyn in the ED for presumed HCAP, and Solu-Medrol for COPD. EKG does show TWI in V2-5 not seen on prior. Initial Troponin is 0.02. BNP elevated at 811 (baseline mid-100 or lower).   Hospital Course by problem  list: Principal Problem:   Severe chronic obstructive pulmonary disease (HCC) Active Problems:   Coronary atherosclerosis   Gastroesophageal reflux disease   Chronic diastolic CHF (congestive heart failure) (HCC)   Primary adenocarcinoma of left lung (HCC)   Hypercapnic respiratory failure, chronic (HCC)   Vitamin B12 deficiency neuropathy   Major depression (HCC)   SOB (shortness of breath)   Chronic congestive heart failure (HCC)   COPD Exacerbation: Patient presented with SOB and LLL consolidation concerning for PNA.  However, she  did not have leukocytosis, fever, or purulent sputum.  Therefore, her symptoms were felt to be more consistent with viral COPD exacerbation.  Patient was given one does of Vancomycin and Zosyn in the ED for possible HCAP, which were discontinued on admission.  She also received one dose of IV Solumedrol and continued on Prednisone 40 mg daily for 5 days of total steroid (last dose 12/3).  She ambulated and maintained oxygen saturation > 88% on day of discharge.  She felt safe to return home.  Discharge Vitals:   BP 116/63 mmHg  Pulse 80  Temp(Src) 98.4 F (36.9 C) (Oral)  Resp 18  Ht 5' 5" (1.651 m)  Wt 248 lb 1.6 oz (112.537 kg)  BMI 41.29 kg/m2  SpO2 93%  Discharge Labs:  Results for orders placed or performed during the hospital encounter of 03/03/15 (from the past 24 hour(s))  Glucose, capillary     Status: Abnormal   Collection Time: 03/05/15 11:24 AM  Result Value Ref Range   Glucose-Capillary 216 (H) 65 - 99 mg/dL   Comment 1 Notify RN   Glucose, capillary     Status: Abnormal   Collection Time: 03/05/15  4:52 PM  Result Value Ref Range   Glucose-Capillary 290 (H) 65 - 99 mg/dL   Comment 1 Notify RN   Glucose, capillary     Status: Abnormal   Collection Time: 03/05/15  8:40 PM  Result Value Ref Range   Glucose-Capillary 246 (H) 65 - 99 mg/dL   Comment 1 Notify RN    Comment 2 Document in Chart   Glucose, capillary     Status: Abnormal     Collection Time: 03/06/15  6:18 AM  Result Value Ref Range   Glucose-Capillary 116 (H) 65 - 99 mg/dL   Comment 1 Notify RN    Comment 2 Document in Chart     Signed: Iline Oven, MD 03/06/2015, 10:03 AM    Services Ordered on Discharge: none Equipment Ordered on Discharge: 3n1 bedside commode

## 2015-03-05 NOTE — Evaluation (Signed)
Occupational Therapy Evaluation Patient Details Name: [name] MRN: [id number] DOB: 1955/05/07 Today's Date: 03/05/2015    History of Present Illness 59 yo femaile presents with complaint of SOB for past few days and CP starting 11/29 AM. Pt. States CP is R sided with no radiation. Pt with hx of L sided lung cancer and recently txt for PNA. Pt. Wears 4L [place] at home. Pt in recently admitted to hospital secondary to left ankle pain due to fall.    Clinical Impression   Patient presenting with decreased ADL and functional mobility independence. Patient independent to mod I PTA. Patient currently functioning at an overall supervision to min assist level. Patient will benefit from acute OT to increase overall independence in the areas of ADLs, functional mobility, and overall safety in order to safely discharge home with HHOT.   Question patient's home environment situation. Patient reports she does not feel comfortable, but her house is soon to be condemned and she has to be out by December 9th. Pt reports she uses a bed pan on her couch and her grandson empties it for her. Patient is in need of a BSC for her hygiene and safety.     Follow Up Recommendations  Home health OT;Supervision - Intermittent    Equipment Recommendations  3 in 1 bedside comode    Recommendations for Other Services  None at this time  Precautions / Restrictions Precautions Precautions: Fall Restrictions Weight Bearing Restrictions: No    Mobility Bed Mobility General bed mobility comments: Pt found seated EOB upon OT entering/exiting room  Transfers Overall transfer level: Needs assistance Equipment used: None Transfers: Sit to/from Stand Sit to Stand: Supervision General transfer comment: supervision for safety     Balance Overall balance assessment: History of Falls;Needs assistance Sitting-balance support: No upper extremity supported;Feet supported Sitting balance-Leahy Scale: Good     Standing  balance support: No upper extremity supported;During functional activity Standing balance-Leahy Scale: Fair     ADL Overall ADL's : Needs assistance/impaired Eating/Feeding: Set up;Sitting   Grooming: Supervision/safety;Standing;Wash/dry hands   Upper Body Bathing: Set up;Sitting   Lower Body Bathing: Minimal assistance;Sit to/from stand   Upper Body Dressing : Set up;Sitting   Lower Body Dressing: Minimal assistance;Sit to/from stand   Toilet Transfer: Supervision/safety;Grab bars;Comfort height toilet;Ambulation   Toileting- Clothing Manipulation and Hygiene: Supervision/safety;Sit to/from stand       Functional mobility during ADLs: Supervision/safety        Pertinent Vitals/Pain Pain Assessment: No/denies pain     Hand Dominance Right   Extremity/Trunk Assessment Upper Extremity Assessment Upper Extremity Assessment: Overall WFL for tasks assessed   Lower Extremity Assessment Lower Extremity Assessment: Defer to PT evaluation   Cervical / Trunk Assessment Cervical / Trunk Assessment: Normal   Communication Communication Communication: No difficulties   Cognition Arousal/Alertness: Awake/alert Behavior During Therapy: WFL for tasks assessed/performed Overall Cognitive Status: Within Functional Limits for tasks assessed              Home Living Family/patient expects to be discharged to:: Private residence Living Arrangements: Children;Other relatives Available Help at Discharge: Family;Available 24 hours/day Type of Home: House Home Access: Stairs to enter [employer] of Steps: 1 step down   Home Layout: Multi-level Alternate Level Stairs-Number of Steps: 1 - has to step on/off stool to get in/out of bathroom, this is how patient fell   Bathroom Shower/Tub: Corporate investment banker: Standard     Home Equipment: Cane - single point;Crutches;Walker - 2  wheels;Shower seat;Wheelchair - manual   Additional Comments:  Pt reports that she has to move out of her house on December 9th. She states her roommates have found another place, but she's not sure where she'll be.       Prior Functioning/Environment Level of Independence: Independent  Comments: Pt lives in soon to be condemned home with add on bathroom which is where pt fell ~ 2 weeks ago with hospital admission    OT Diagnosis: Generalized weakness   OT Problem List: Decreased strength;Decreased activity tolerance;Impaired balance (sitting and/or standing);Decreased safety awareness;Decreased knowledge of use of DME or AE;Pain   OT Treatment/Interventions: Self-care/ADL training;Therapeutic exercise;DME and/or AE instruction;Therapeutic activities;Patient/family education;Balance training;Energy conservation    OT Goals(Current goals can be found in the care plan section) Acute Rehab OT Goals Patient Stated Goal: to go home OT Goal Formulation: With patient Time For Goal Achievement: 03/19/15 Potential to Achieve Goals: Good ADL Goals Pt Will Perform Grooming: Independently;standing Pt Will Perform Lower Body Bathing: Independently;sit to/from stand Pt Will Perform Lower Body Dressing: Independently;sit to/from stand Pt Will Transfer to Toilet: ambulating;bedside commode;with modified independence Pt Will Perform Tub/Shower Transfer: Tub transfer;ambulating;3 in 1;with modified independence Additional ADL Goal #1: Pt will be independent with functional mobility  OT Frequency: Min 2X/week   Barriers to D/C: Inaccessible home environment  Pt reports having to step on/off stool to get into her bathroom   End of Session Equipment Utilized During Treatment: Other (comment) (4L/min via [place])  Activity Tolerance: Patient tolerated treatment well Patient left: in bed;with call bell/phone within reach   Time: 6067-7034 OT Time Calculation (min): 23 min Charges:  OT General Charges $OT Visit: 1 Procedure OT Evaluation $Initial OT Evaluation Tier  I: 1 Procedure OT Treatments $Self Care/Home Management : 8-22 mins  Narely Nobles , MS, OTR/L, CLT Pager: [phone]  03/05/2015, 2:44 PM

## 2015-03-06 LAB — GLUCOSE, CAPILLARY
GLUCOSE-CAPILLARY: 116 mg/dL — AB (ref 65–99)
Glucose-Capillary: 185 mg/dL — ABNORMAL HIGH (ref 65–99)

## 2015-03-06 MED ORDER — PREDNISONE 20 MG PO TABS: 40.0000 mg | ORAL_TABLET | Freq: Every day | ORAL | Status: DC

## 2015-03-06 MED ORDER — PANTOPRAZOLE SODIUM 40 MG PO TBEC: 40.0000 mg | DELAYED_RELEASE_TABLET | Freq: Every day | ORAL | Status: DC

## 2015-03-06 NOTE — Progress Notes (Signed)
Pt has orders to be discharged. Discharge instructions given and pt has no additional questions at this time. Medication regimen reviewed and pt educated. Pt verbalized understanding and has no additional questions. Telemetry box removed. IV removed and site in good condition. Pt stable and waiting for transportation.   Britiney Blahnik RN 

## 2015-03-06 NOTE — Progress Notes (Signed)
SATURATION QUALIFICATIONS: (This note is used to comply with regulatory documentation for home oxygen)  Patient Saturations on 4 L [place] at Rest = 92%  Patient Saturations on 4 L [place] while Ambulating = 88%

## 2015-03-06 NOTE — Progress Notes (Signed)
Subjective: NAEON. Patient satted > 98% on home 4L overnight.  She wants to go home today.  Objective: Vital signs in last 24 hours: Filed Vitals:   03/05/15 1625 03/05/15 2024 03/05/15 2037 03/06/15 0559  BP:   100/52 116/63  Pulse:  76 82 80  Temp:   97.8 F (36.6 C) 98.4 F (36.9 C)  TempSrc:   Oral Oral  Resp:  _0 Height:      Weight:    248 lb 1.6 oz (112.537 kg)  SpO2: 94% 93% 93% 93%   Weight change: 12.8 oz (0.363 kg)  Intake/Output Summary (Last 24 hours) at 03/06/15 0645 Last data filed at 03/06/15 0528  Gross per 24 hour  Intake   1260 ml  Output   1200 ml  Net     60 ml   General: resting in bed HEENT: PERRL, EOMI, no scleral icterus Cardiac: RRR, no rubs, murmurs or gallops Pulm: No use of accessory muscles or increased WOB. No wheezing or crackles.  Decreased air movement throughout. Abd: soft, nontender Ext: +1 pedal edema bilaterally   Assessment/Plan: Principal Problem:   Severe chronic obstructive pulmonary disease (HCC) Active Problems:   Coronary atherosclerosis   Gastroesophageal reflux disease   Chronic diastolic CHF (congestive heart failure) (HCC)   Primary adenocarcinoma of left lung (HCC)   Hypercapnic respiratory failure, chronic (HCC)   Vitamin B12 deficiency neuropathy   Major depression (HCC)   SOB (shortness of breath)   Chronic congestive heart failure [facility])  [name] is a 59 year old woman with PMH of Stage 2 NSCLC (completed radiation therapy 11/23), COPD (4L [place] at home, FEV1 42%, FEV1/FVC 88% 12/02/14), pulmonary HTN, CAD s/p DES 2010, CHF (EF 50-55%), BPPV, and B12 deficiency who presents with right sided chest pain and worsened shortness of breath  Exacerbation of Severe COPD: She is on home oxygen 4L continuous via [place].  Patient presented with worsened dyspnea, cough with minimal clear sputum production, and right sided chest pain. CXR shows LLL consolidation thought to be residual NSCLC vs scarring s/p radiation  therapy.  She does not have leukocytosis, purulent sputum, or fever which makes me think this is less likely a pneumonia in addition to her recent completed antibiotic course. Her symptoms continue to improve after dose of IV Solu-Medrol and Prednisone. Currently satting b/w 90-93% on 5L [place]. However, she desatted to 75% on 5L [place] and complained of dizziness and HA while ambulating. - Continue oral Prednisone for 5 day total course (last dose 12/3) - Wean to 4L [place] with SpO2 88-92 % - Albuterol, Duonebs, Dulera, Spiriva - PT/OT/IS  H/o Stage 2a NSCLC s/p radiation therapy completed 11/23: Followed by Dr. Pablo Ledger ([facility]). - CTM  Chest Pain, resolved: Patient presented with right sided chest pain which did not radiate, was dull, constant, and worse with inspiration and palpation. Her EKG did show TWI in V2-V5 on arrival and was seen again on repeat ECG yesterday and today. Troponins have been negative. She does not have anymore chest pain today. Her pain is unlikely to be ACS, probably musculoskeletal in nature. T wave inversions may be due to demand, but unsure of cause. - ECG unchanged from baseline with resolution of TWI in V6.  Dispo:  Anticipated discharge in approximately 1 day(s).   The patient does have a current PCP Oval Linsey, MD) and does need an [facility] hospital follow-up appointment after discharge.  The patient does have transportation  limitations that hinder transportation to clinic appointments.    LOS: 3 days   Iline Oven, MD 03/06/2015, 6:45 AM

## 2015-03-06 NOTE — Progress Notes (Signed)
PT Cancellation Note  Patient Details Name: [name] MRN: [id number] DOB: April 06, 1955   Cancelled Treatment:    Reason Eval/Treat Not Completed: Other (comment) (Pt is leaving soon and declined the evaluation).   Ramond Dial 03/06/2015, 11:37 AM   Mee Hives, PT MS Acute Rehab Dept. Number: [facility] [phone] and [place] [phone]

## 2015-03-06 NOTE — Care Management Important Message (Signed)
Important Message  Patient Details  Name: [name] MRN: [id number] Date of Birth: 04/13/1955   Medicare Important Message Given:  Yes    [name] 03/06/2015, 2:04 PM

## 2015-03-06 NOTE — Discharge Instructions (Signed)
1. Take Prednisone 40 mg tomorrow (12/3). 2. Continue home COPD medications 3. Follow up with Dr. Eppie Gibson in [facility]

## 2015-03-07 DIAGNOSIS — R0902 Hypoxemia: Secondary | ICD-10-CM | POA: Diagnosis not present

## 2015-03-07 DIAGNOSIS — J9600 Acute respiratory failure, unspecified whether with hypoxia or hypercapnia: Secondary | ICD-10-CM | POA: Diagnosis not present

## 2015-03-07 DIAGNOSIS — J449 Chronic obstructive pulmonary disease, unspecified: Secondary | ICD-10-CM | POA: Diagnosis not present

## 2015-03-09 ENCOUNTER — Other Ambulatory Visit: Payer: Self-pay | Admitting: *Deleted

## 2015-03-10 ENCOUNTER — Encounter: Payer: Self-pay | Admitting: *Deleted

## 2015-03-10 NOTE — Patient Outreach (Signed)
Transition of care call (week 1- discharged 12/2): Spoke with pt who reports doing good since discharge.  Pt reports getting ready to move, down payment made but does not have the current address available.  Pt states while in the hospital let them know would do home health after she moves as her current house is too little.   Pt states she is to f/u with Dr. Eppie Gibson 12/12, no problems with transportation.   RN CM discussed with pt  f/u with weekly phone calls 31 days post discharge and as requested will schedule home visit after her move.    Plan to f/u again telephonically 12/12 as part of ongoing transition of care. Plan to inform Dr. Eppie Gibson by in basket in [place] involvement.

## 2015-03-15 ENCOUNTER — Emergency Department (HOSPITAL_COMMUNITY): Payer: Commercial Managed Care - HMO

## 2015-03-15 ENCOUNTER — Encounter (HOSPITAL_COMMUNITY): Payer: Self-pay

## 2015-03-15 ENCOUNTER — Inpatient Hospital Stay (HOSPITAL_COMMUNITY)
Admission: EM | Admit: 2015-03-15 | Discharge: 2015-03-18 | DRG: 190 | Disposition: A | Discharge status: Home / Self Care | Payer: Commercial Managed Care - HMO | Attending: Oncology | Admitting: Oncology

## 2015-03-15 DIAGNOSIS — Z6841 Body Mass Index (BMI) 40.0 and over, adult: Secondary | ICD-10-CM

## 2015-03-15 DIAGNOSIS — Z85118 Personal history of other malignant neoplasm of bronchus and lung: Secondary | ICD-10-CM

## 2015-03-15 DIAGNOSIS — Z79899 Other long term (current) drug therapy: Secondary | ICD-10-CM | POA: Diagnosis not present

## 2015-03-15 DIAGNOSIS — I959 Hypotension, unspecified: Secondary | ICD-10-CM | POA: Diagnosis not present

## 2015-03-15 DIAGNOSIS — C3490 Malignant neoplasm of unspecified part of unspecified bronchus or lung: Secondary | ICD-10-CM | POA: Diagnosis not present

## 2015-03-15 DIAGNOSIS — Y95 Nosocomial condition: Secondary | ICD-10-CM | POA: Diagnosis present

## 2015-03-15 DIAGNOSIS — I2510 Atherosclerotic heart disease of native coronary artery without angina pectoris: Secondary | ICD-10-CM | POA: Diagnosis present

## 2015-03-15 DIAGNOSIS — Z7952 Long term (current) use of systemic steroids: Secondary | ICD-10-CM | POA: Diagnosis not present

## 2015-03-15 DIAGNOSIS — J440 Chronic obstructive pulmonary disease with acute lower respiratory infection: Secondary | ICD-10-CM | POA: Diagnosis not present

## 2015-03-15 DIAGNOSIS — Z888 Allergy status to other drugs, medicaments and biological substances status: Secondary | ICD-10-CM | POA: Diagnosis not present

## 2015-03-15 DIAGNOSIS — K219 Gastro-esophageal reflux disease without esophagitis: Secondary | ICD-10-CM | POA: Diagnosis present

## 2015-03-15 DIAGNOSIS — R0789 Other chest pain: Secondary | ICD-10-CM | POA: Diagnosis not present

## 2015-03-15 DIAGNOSIS — Z9981 Dependence on supplemental oxygen: Secondary | ICD-10-CM

## 2015-03-15 DIAGNOSIS — Z7982 Long term (current) use of aspirin: Secondary | ICD-10-CM

## 2015-03-15 DIAGNOSIS — Z9119 Patient's noncompliance with other medical treatment and regimen: Secondary | ICD-10-CM | POA: Diagnosis not present

## 2015-03-15 DIAGNOSIS — E538 Deficiency of other specified B group vitamins: Secondary | ICD-10-CM | POA: Diagnosis present

## 2015-03-15 DIAGNOSIS — J441 Chronic obstructive pulmonary disease with (acute) exacerbation: Secondary | ICD-10-CM

## 2015-03-15 DIAGNOSIS — F17210 Nicotine dependence, cigarettes, uncomplicated: Secondary | ICD-10-CM | POA: Diagnosis not present

## 2015-03-15 DIAGNOSIS — J189 Pneumonia, unspecified organism: Secondary | ICD-10-CM | POA: Diagnosis not present

## 2015-03-15 DIAGNOSIS — I5030 Unspecified diastolic (congestive) heart failure: Secondary | ICD-10-CM | POA: Diagnosis present

## 2015-03-15 DIAGNOSIS — E785 Hyperlipidemia, unspecified: Secondary | ICD-10-CM | POA: Diagnosis present

## 2015-03-15 DIAGNOSIS — Z7951 Long term (current) use of inhaled steroids: Secondary | ICD-10-CM | POA: Diagnosis not present

## 2015-03-15 DIAGNOSIS — I5032 Chronic diastolic (congestive) heart failure: Secondary | ICD-10-CM | POA: Diagnosis not present

## 2015-03-15 DIAGNOSIS — R05 Cough: Secondary | ICD-10-CM | POA: Diagnosis not present

## 2015-03-15 DIAGNOSIS — R0602 Shortness of breath: Secondary | ICD-10-CM | POA: Diagnosis not present

## 2015-03-15 DIAGNOSIS — J449 Chronic obstructive pulmonary disease, unspecified: Secondary | ICD-10-CM | POA: Diagnosis present

## 2015-03-15 DIAGNOSIS — C3432 Malignant neoplasm of lower lobe, left bronchus or lung: Secondary | ICD-10-CM | POA: Diagnosis not present

## 2015-03-15 LAB — BASIC METABOLIC PANEL
ANION GAP: 4 — AB (ref 5–15)
BUN: <5 mg/dL — ABNORMAL LOW (ref 6–20)
CALCIUM: 8.2 mg/dL — AB (ref 8.9–10.3)
CHLORIDE: 96 mmol/L — AB (ref 101–111)
CO2: 38 mmol/L — AB (ref 22–32)
Creatinine, Ser: 0.68 mg/dL (ref 0.44–1.00)
GFR calc non Af Amer: >60 mL/min (ref >60)
Glucose, Bld: 133 mg/dL — ABNORMAL HIGH (ref 65–99)
Potassium: 4.2 mmol/L (ref 3.5–5.1)
Sodium: 138 mmol/L (ref 135–145)

## 2015-03-15 LAB — CBC WITH DIFFERENTIAL/PLATELET
BASOS ABS: 0 10*3/uL (ref 0.0–0.1)
BASOS PCT: 0 %
Eosinophils Absolute: 0.1 10*3/uL (ref 0.0–0.7)
Eosinophils Relative: 1 %
HEMATOCRIT: 40.5 % (ref 36.0–46.0)
HEMOGLOBIN: 11.7 g/dL — AB (ref 12.0–15.0)
Lymphocytes Relative: 6 %
Lymphs Abs: 0.5 10*3/uL — ABNORMAL LOW (ref 0.7–4.0)
MCH: 30.5 pg (ref 26.0–34.0)
MCHC: 28.9 g/dL — ABNORMAL LOW (ref 30.0–36.0)
MCV: 105.7 fL — ABNORMAL HIGH (ref 78.0–100.0)
Monocytes Absolute: 1 10*3/uL (ref 0.1–1.0)
Monocytes Relative: 11 %
NEUTROS ABS: 7.8 10*3/uL — AB (ref 1.7–7.7)
NEUTROS PCT: 82 %
Platelets: 160 10*3/uL (ref 150–400)
RBC: 3.83 MIL/uL — ABNORMAL LOW (ref 3.87–5.11)
RDW: 14.5 % (ref 11.5–15.5)
WBC: 9.5 10*3/uL (ref 4.0–10.5)

## 2015-03-15 LAB — PROCALCITONIN

## 2015-03-15 LAB — I-STAT TROPONIN, ED: TROPONIN I, POC: 0.08 ng/mL (ref 0.00–0.08)

## 2015-03-15 LAB — MRSA PCR SCREENING: MRSA BY PCR: NEGATIVE

## 2015-03-15 LAB — I-STAT CG4 LACTIC ACID, ED: Lactic Acid, Venous: 0.72 mmol/L (ref 0.5–2.0)

## 2015-03-15 LAB — BRAIN NATRIURETIC PEPTIDE: B NATRIURETIC PEPTIDE 5: 996.2 pg/mL — AB (ref 0.0–100.0)

## 2015-03-15 MED ORDER — GUAIFENESIN-DM 100-10 MG/5ML PO SYRP
5.0000 mL | ORAL_SOLUTION | ORAL | Status: DC | PRN
  Administered 2015-03-16 – 2015-03-18 (×2): 5 mL via ORAL
  Filled 2015-03-15 (×2): qty 5

## 2015-03-15 MED ORDER — PIPERACILLIN-TAZOBACTAM 3.375 G IVPB
3.3750 g | Freq: Three times a day (TID) | INTRAVENOUS | Status: DC
  Administered 2015-03-16: 3.375 g via INTRAVENOUS
  Filled 2015-03-15 (×3): qty 50

## 2015-03-15 MED ORDER — VANCOMYCIN HCL 10 G IV SOLR
2000.0000 mg | Freq: Once | INTRAVENOUS | Status: DC
  Administered 2015-03-15: 2000 mg via INTRAVENOUS
  Filled 2015-03-15: qty 2000

## 2015-03-15 MED ORDER — PANTOPRAZOLE SODIUM 40 MG PO TBEC
40.0000 mg | DELAYED_RELEASE_TABLET | Freq: Every day | ORAL | Status: DC
  Administered 2015-03-16 – 2015-03-18 (×3): 40 mg via ORAL
  Filled 2015-03-15 (×3): qty 1

## 2015-03-15 MED ORDER — GABAPENTIN 300 MG PO CAPS
300.0000 mg | ORAL_CAPSULE | Freq: Every day | ORAL | Status: DC
  Administered 2015-03-15 – 2015-03-17 (×3): 300 mg via ORAL
  Filled 2015-03-15: qty 3
  Filled 2015-03-15 (×2): qty 1

## 2015-03-15 MED ORDER — TIOTROPIUM BROMIDE MONOHYDRATE 18 MCG IN CAPS
18.0000 ug | ORAL_CAPSULE | Freq: Every day | RESPIRATORY_TRACT | Status: DC
  Administered 2015-03-16 – 2015-03-18 (×3): 18 ug via RESPIRATORY_TRACT
  Filled 2015-03-15: qty 5

## 2015-03-15 MED ORDER — ACETAMINOPHEN 500 MG PO TABS
500.0000 mg | ORAL_TABLET | Freq: Four times a day (QID) | ORAL | Status: DC | PRN
  Administered 2015-03-15: 500 mg via ORAL
  Filled 2015-03-15: qty 1

## 2015-03-15 MED ORDER — GUAIFENESIN ER 600 MG PO TB12
600.0000 mg | ORAL_TABLET | Freq: Two times a day (BID) | ORAL | Status: DC
  Administered 2015-03-15 – 2015-03-18 (×6): 600 mg via ORAL
  Filled 2015-03-15 (×6): qty 1

## 2015-03-15 MED ORDER — SODIUM CHLORIDE 0.9 % IJ SOLN
3.0000 mL | Freq: Two times a day (BID) | INTRAMUSCULAR | Status: DC
  Administered 2015-03-15 – 2015-03-18 (×5): 3 mL via INTRAVENOUS

## 2015-03-15 MED ORDER — ENOXAPARIN SODIUM 40 MG/0.4ML ~~LOC~~ SOLN
40.0000 mg | SUBCUTANEOUS | Status: DC
  Administered 2015-03-15 – 2015-03-17 (×3): 40 mg via SUBCUTANEOUS
  Filled 2015-03-15 (×3): qty 0.4

## 2015-03-15 MED ORDER — ALBUTEROL SULFATE (2.5 MG/3ML) 0.083% IN NEBU: 2.5000 mg | INHALATION_SOLUTION | Freq: Four times a day (QID) | RESPIRATORY_TRACT | Status: DC | PRN

## 2015-03-15 MED ORDER — PIPERACILLIN-TAZOBACTAM 3.375 G IVPB 30 MIN
3.3750 g | Freq: Once | INTRAVENOUS | Status: AC
  Administered 2015-03-15: 3.375 g via INTRAVENOUS
  Filled 2015-03-15: qty 50

## 2015-03-15 MED ORDER — VANCOMYCIN HCL IN DEXTROSE 750-5 MG/150ML-% IV SOLN
750.0000 mg | Freq: Two times a day (BID) | INTRAVENOUS | Status: DC
  Filled 2015-03-15: qty 150

## 2015-03-15 MED ORDER — PREDNISONE 20 MG PO TABS
40.0000 mg | ORAL_TABLET | Freq: Every day | ORAL | Status: DC
  Administered 2015-03-15 – 2015-03-18 (×3): 40 mg via ORAL
  Filled 2015-03-15 (×4): qty 2

## 2015-03-15 MED ORDER — ALBUTEROL SULFATE HFA 108 (90 BASE) MCG/ACT IN AERS: 2.0000 | INHALATION_SPRAY | Freq: Four times a day (QID) | RESPIRATORY_TRACT | Status: DC | PRN

## 2015-03-15 MED ORDER — ALBUTEROL SULFATE (2.5 MG/3ML) 0.083% IN NEBU
5.0000 mg | INHALATION_SOLUTION | Freq: Once | RESPIRATORY_TRACT | Status: AC
  Administered 2015-03-15: 5 mg via RESPIRATORY_TRACT
  Filled 2015-03-15: qty 6

## 2015-03-15 MED ORDER — VANCOMYCIN HCL IN DEXTROSE 1-5 GM/200ML-% IV SOLN
1000.0000 mg | Freq: Once | INTRAVENOUS | Status: DC
  Administered 2015-03-15: 1000 mg via INTRAVENOUS
  Filled 2015-03-15: qty 200

## 2015-03-15 MED ORDER — IPRATROPIUM-ALBUTEROL 0.5-2.5 (3) MG/3ML IN SOLN
3.0000 mL | RESPIRATORY_TRACT | Status: DC
  Administered 2015-03-15 – 2015-03-16 (×6): 3 mL via RESPIRATORY_TRACT
  Filled 2015-03-15 (×6): qty 3

## 2015-03-15 MED ORDER — ACETAMINOPHEN 325 MG PO TABS
650.0000 mg | ORAL_TABLET | Freq: Four times a day (QID) | ORAL | Status: DC | PRN
  Filled 2015-03-15: qty 2

## 2015-03-15 MED ORDER — PRAVASTATIN SODIUM 40 MG PO TABS
40.0000 mg | ORAL_TABLET | Freq: Every day | ORAL | Status: DC
Start: 2015-03-16 — End: 2015-03-18
  Administered 2015-03-16 – 2015-03-17 (×2): 40 mg via ORAL
  Filled 2015-03-15 (×3): qty 1

## 2015-03-15 MED ORDER — SODIUM CHLORIDE 0.9 % IV BOLUS (SEPSIS): 500.0000 mL | INTRAVENOUS | Status: DC

## 2015-03-15 MED ORDER — PREDNISOLONE 5 MG PO TABS
40.0000 mg | ORAL_TABLET | Freq: Every day | ORAL | Status: DC
  Filled 2015-03-15: qty 8

## 2015-03-15 MED ORDER — MOMETASONE FURO-FORMOTEROL FUM 100-5 MCG/ACT IN AERO
2.0000 | INHALATION_SPRAY | Freq: Two times a day (BID) | RESPIRATORY_TRACT | Status: DC
  Administered 2015-03-16 – 2015-03-18 (×5): 2 via RESPIRATORY_TRACT
  Filled 2015-03-15: qty 8.8

## 2015-03-15 MED ORDER — SODIUM CHLORIDE 0.9 % IV BOLUS (SEPSIS)
1000.0000 mL | INTRAVENOUS | Status: DC
Start: 2015-03-15 — End: 2015-03-15
  Administered 2015-03-15 (×2): 1000 mL via INTRAVENOUS

## 2015-03-15 MED ORDER — ASPIRIN 81 MG PO CHEW
81.0000 mg | CHEWABLE_TABLET | Freq: Every day | ORAL | Status: DC
  Administered 2015-03-16 – 2015-03-17 (×2): 81 mg via ORAL
  Filled 2015-03-15 (×2): qty 1

## 2015-03-15 MED ORDER — IPRATROPIUM-ALBUTEROL 0.5-2.5 (3) MG/3ML IN SOLN: 3.0000 mL | Freq: Four times a day (QID) | RESPIRATORY_TRACT | Status: DC | PRN

## 2015-03-15 NOTE — Progress Notes (Addendum)
ANTIBIOTIC CONSULT NOTE - INITIAL  Pharmacy Consult for Vancomycin and Zosyn Indication: pneumonia  Allergies  Allergen Reactions  . Fluconazole Anaphylaxis, Itching and Swelling  . Atorvastatin Other (See Comments)    Dizziness after med started and resolved per pt after med stopped. September 08 2014    Patient Measurements: Height: 5' 5" (165.1 cm) Weight: 250 lb (113.399 kg) IBW/kg (Calculated) : 57 Adjusted Body Weight:   Vital Signs: Temp: 98.9 F (37.2 C) (12/11 1608) Temp Source: Oral (12/11 1608) BP: 85/61 mmHg (12/11 1730) Pulse Rate: 89 (12/11 1730) Intake/Output from previous day:   Intake/Output from this shift:    Labs:  Recent Labs  03/15/15 1715  WBC 9.5  HGB 11.7*  PLT 160   Estimated Creatinine Clearance: 92.8 mL/min (by C-G formula based on Cr of 0.81). No results for input(s): VANCOTROUGH, VANCOPEAK, VANCORANDOM, GENTTROUGH, GENTPEAK, GENTRANDOM, TOBRATROUGH, TOBRAPEAK, TOBRARND, AMIKACINPEAK, AMIKACINTROU, AMIKACIN in the last 72 hours.   Microbiology: Recent Results (from the past 720 hour(s))  Blood culture (routine x 2)     Status: None   Collection Time: 02/15/15  6:16 PM  Result Value Ref Range Status   Specimen Description BLOOD RIGHT HAND  Final   Special Requests BOTTLES DRAWN AEROBIC AND ANAEROBIC 5CC   Final   Culture NO GROWTH 5 DAYS  Final   Report Status 02/20/2015 FINAL  Final  Blood culture (routine x 2)     Status: None   Collection Time: 02/15/15  6:20 PM  Result Value Ref Range Status   Specimen Description BLOOD LEFT ANTECUBITAL  Final   Special Requests BOTTLES DRAWN AEROBIC AND ANAEROBIC 10ML   Final   Culture NO GROWTH 5 DAYS  Final   Report Status 02/20/2015 FINAL  Final  MRSA PCR Screening     Status: None   Collection Time: 02/15/15 10:05 PM  Result Value Ref Range Status   MRSA by PCR NEGATIVE NEGATIVE Final    Comment:        The GeneXpert MRSA Assay (FDA approved for NASAL specimens only), is one component of  a comprehensive MRSA colonization surveillance program. It is not intended to diagnose MRSA infection nor to guide or monitor treatment for MRSA infections.     Medical History: Past Medical History  Diagnosis Date  . Coronary artery disease     S/P PCI of LAD with DES (12/2008). Total occlusion of RCA noted at that time., medically managed. ACS ruled out 03/2009 with Lexiscan myoview . Followed by [place].  . Pulmonary hypertension ([place])     2-D Echo (05/6376) - Systolic pressure was moderately increased. PA peak pressure  57mHg. 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 Followed by Dr. WJoya Gaskins(Vel[facility] Heckler  . Diastolic dysfunction     2-D Echo (12/2008) - Normal LV Systolic funciton with EF 60-65%. Grade 1 diastolid dysfunction. No regional wall motion abnormalities. Moderate pulmonary HTN with PA peak pressure 552mg.  . Marland KitchenOPD (chronic obstructive pulmonary disease) (HCC)     Severe. Gold Stage IV.  PFTs (12/2008) - severe obstructive airway disease. Active tobacco use. Requires 4L O2 at home.  . Pulmonary nodule, right     Small right middle lobe nodule. Stable as of 12/2008.  . Marland Kitchenrediabetes     HgbA1c 6.4 (12/2008)  . Hx MRSA infection     Recurrent MRSA thigh abscesses.  . Obesity   . Hyperlipidemia   .  GERD (gastroesophageal reflux disease)     S/P Nissen fundoplication.  . CHF (congestive heart failure) ([place])   . On home oxygen therapy since 2010    4L all the time  . Shortness of breath dyspnea     with a lot of exertion; if fluid builds up  . Pneumonia   . Depression   . Full dentures   . Chronic bronchitis ([place])     "get it most years" (03/03/2015)  . Lung cancer ([place])     "left"  . History of hiatal hernia   . Arthritis     "some scattered" (03/03/2015)    Medications:  Scheduled:   Assessment: 59yo female s/p recent admission, now returning with R/O HCAP.   She has COPD & is on [place]-4L at home, presenting with worsening SOB.  She received Vancomycin for ~24hr during her mid-Nov admission.  WBC wnl 12/1:  Cr 0.81 (normalized CrCl ~4m/min)  Goal of Therapy:  Vancomycin trough level 15-20 mcg/ml  Plan:  Called RN & told her not to give Vanc 10080malready ordered, but not charted Zosyn 3.375g IV x 1 over 3091m then q8 with 4hr infusion Vanc 2000m76m x 1, then 750mg30mq12 Watch renal fxn Vanc trough when appropriate  KendrGracy BruinsrmD [facility]

## 2015-03-15 NOTE — ED Notes (Signed)
Pt arrived via POV (son dropped off).  Pt states she has been moving all day and in the cold.  SOB started getting bad around 3pm.  Pt is O2 dependent at 4L

## 2015-03-15 NOTE — ED Notes (Signed)
[facility] contacted to page Code Sepsis

## 2015-03-15 NOTE — H&P (Signed)
Date: 03/15/2015               Patient Name:  [name] MRN: [id number]  DOB: 06-29-1955 Age / Sex: 59 y.o., female   PCP: Oval Linsey, MD         Medical Service: Internal Medicine Teaching Service         Attending Physician: Dr. Annia Belt, MD    First Contact: Dr. Lindon Romp Pager: [phone]  Second Contact: Dr. Osa Craver Pager: [phone]       After Hours (After 5p/  First Contact Pager: [phone]  weekends / holidays): Second Contact Pager: [phone]   Chief Complaint: dyspnea   History of Present Illness:   This is a 59 yo woman with PMH of Stage 2 NSCLC (completed radiation therapy 11/23), COPD (4L [place] at home, FEV1 42%, FEV1/FVC 88% 12/02/14), pulmonary HTN, CAD s/p DES 2010, CHF (EF 50-55%), BPPV, and B12 deficiency  She says that she had shortness of breath and wheezing lasting 30 minutes this morning, which began while she was moving around in the house and then had to sit down. She says that she has been having dry non-productive cough for few days.  She denies f/c/n/v, any sick contacts or URI symptoms. She denies lightheadedness or dizziness, and no LOC. She says that she has stable 2 pillow orthopnea. She says that after she was discharged from the hospital last time, she felt better, and then seems to have gotten a little worse. She denies hematochezia or melena. No weight changes She is on 4L home oxygen and says she has not had increased oxygen requirements. She has not used her inhalers more frequently. She took all of her meds this morning.  She says she only takes lasix if she gets edema of the legs and she has not taken that in a while. She has noticed increased edema of her leg right being more than the left.   She was recently hospitalized several times 11/29 to 12/2 for COPD exacerbation  And given steroids  11/13-11/16 for presumed post obstructive pneumonia and finished week of antibiotics. She finished her radiation treatment  11/23.  In the ER, her blood pressure dropped to 85/61 so she was given 1 L of bolus. On exam, she was a little tachycardic at 100, with BP running at 90/70 and was resting comfortably in bed.         Meds: Current Facility-Administered Medications  Medication Dose Route Frequency Provider Last Rate Last Dose  . acetaminophen (TYLENOL) tablet 500 mg  500 mg Oral Q6H PRN Alexa Sherral Hammers, MD      . Derrill Memo ON 03/16/2015] aspirin chewable tablet 81 mg  81 mg Oral QHS Alexa Sherral Hammers, MD      . enoxaparin (LOVENOX) injection 40 mg  40 mg Subcutaneous Q24H Alexa Sherral Hammers, MD      . gabapentin (NEURONTIN) capsule 300 mg  300 mg Oral QHS Alexa Sherral Hammers, MD      . guaiFENesin (MUCINEX) 12 hr tablet 600 mg  600 mg Oral BID Alexa Sherral Hammers, MD      . guaiFENesin-dextromethorphan (ROBITUSSIN DM) 100-10 MG/5ML syrup 5 mL  5 mL Oral Q4H PRN Alexa Sherral Hammers, MD      . ipratropium-albuterol (DUONEB) 0.5-2.5 (3) MG/3ML nebulizer solution 3 mL  3 mL Nebulization Q6H PRN Alexa Sherral Hammers, MD      . Derrill Memo ON 03/16/2015] mometasone-formoterol (DULERA) 100-5 MCG/ACT inhaler 2 puff  2 puff Inhalation BID Alexa Sherral Hammers, MD      . Derrill Memo ON 03/16/2015] pantoprazole (PROTONIX) EC tablet 40 mg  40 mg Oral Daily Alexa Sherral Hammers, MD      . Derrill Memo ON 03/16/2015] piperacillin-tazobactam (ZOSYN) IVPB 3.375 g  3.375 g Intravenous 3 times per day Jaquita Folds, RPH      . [START ON 03/16/2015] pravastatin (PRAVACHOL) tablet 40 mg  40 mg Oral q1800 Norman Herrlich, MD      . sodium chloride 0.9 % injection 3 mL  3 mL Intravenous Q12H Alexa Sherral Hammers, MD      . Derrill Memo ON 03/16/2015] tiotropium (SPIRIVA) inhalation capsule 18 mcg  18 mcg Inhalation Daily Alexa Sherral Hammers, MD        Allergies: Allergies as of 03/15/2015 - Review Complete 03/15/2015  Allergen Reaction Noted  . Fluconazole Anaphylaxis, Itching, and Swelling   . Atorvastatin Other (See Comments) 09/09/2014   Past  Medical History  Diagnosis Date  . Coronary artery disease     S/P PCI of LAD with DES (12/2008). Total occlusion of RCA noted at that time., medically managed. ACS ruled out 03/2009 with Lexiscan myoview . Followed by [place].  . Pulmonary hypertension ([place])     2-D Echo (86/7672) - Systolic pressure was moderately increased. PA peak pressure  62mHg. 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 Followed by Dr. WJoya Gaskins(Velora Heckler  . Diastolic dysfunction     2-D Echo (12/2008) - Normal LV Systolic funciton with EF 60-65%. Grade 1 diastolid dysfunction. No regional wall motion abnormalities. Moderate pulmonary HTN with PA peak pressure 589mg.  . Marland KitchenOPD (chronic obstructive pulmonary disease) (HCC)     Severe. Gold Stage IV.  PFTs (12/2008) - severe obstructive airway disease. Active tobacco use. Requires 4L O2 at home.  . Pulmonary nodule, right     Small right middle lobe nodule. Stable as of 12/2008.  . Marland Kitchenrediabetes     HgbA1c 6.4 (12/2008)  . Hx MRSA infection     Recurrent MRSA thigh abscesses.  . Obesity   . Hyperlipidemia   . GERD (gastroesophageal reflux disease)     S/P Nissen fundoplication.  . CHF [place]ive heart failure) ([place]  . On home oxygen therapy since 2010    4L all the time  . Shortness of breath dyspnea     with a lot of exertion; if fluid builds up  . Pneumonia   . Depression   . Full dentures   . Chronic bronchitis ([place]    "get it most years" (03/03/2015)  . [place] cancer [place]    "left"  . History of hiatal hernia   . Arthritis     "some scattered" (03/03/2015)   Past Surgical History  Procedure Laterality Date  . Nissen fundoplication    . Right heart catheterization N/A 11/09/2012    Procedure: RIGHT HEART CATH;  Surgeon: DaLarey DresserMD;  [facility];  Service: Cardiovascular;  Laterality: N/A;  . Appendectomy    . Tonsillectomy    .  Back surgery    . Knee arthroscopy Right ~ 2000  . Tubal ligation  1979  . Video bronchoscopy with endobronchial ultrasound N/A 12/15/2014    Procedure: VIDEO BRONCHOSCOPY WITH ENDOBRONCHIAL ULTRASOUND;  Surgeon: PeIvin PootMD;  [facility];  Service: Thoracic;  Laterality: N/A;  . Coronary angioplasty with  stent placement  2010    [facility]; RCA  . Bilateral oophorectomy  1979  . Hernia repair    . Breast lumpectomy Right 1990's     (biopsy negative)  . Lumbar disc surgery  2001    "herniated discs; both by [facility], Dr. Lorin Mercy"  . Abdominal hysterectomy  1980's    "still have my cervix"   Family History  Problem Relation Age of Onset  . Heart disease Mother 65    Deceased from MI at 65yo  . Hypertension Mother   . Heart disease Father 48    Deceased of MI age 62yo  . Hypertension Father   . Hypertension Brother   . Lung cancer      Grandmother   Social History   Social History  . Marital Status: Divorced    Spouse Name: N/A  . Number of Children: N/A  . Years of Education: N/A   Occupational History  . Not on file.   Social History Main Topics  . Smoking status: Current Every Day Smoker -- 2.00 packs/day for 45 years    Types: Cigarettes    Start date: 04/04/1969  . Smokeless tobacco: Never Used  . Alcohol Use: No  . Drug Use: No  . Sexual Activity: Not Currently    Birth Control/ Protection: None   Other Topics Concern  . Not on file   Social History Narrative   Formerly worked as a Scientist, water quality, now disabled.   Divorced.   2 grown children.   Lives with her grandson.    Review of Systems: A 12 point ROS obtained and the pertinents noted in HPI and others unremarkable  Physical Exam: Blood pressure 102/57, pulse 88, temperature 98.9 F (37.2 C), temperature source Oral, resp. rate 22, height _0  (1.651 m), weight 250 lb (113.399 kg), SpO2 97 %.  General: A&O, in resting in bed CV: tachycardic to 100, normal s1, s2, no m/r/g,  Resp: coarse  crackles heard throughout and end expiratory wheezing heard throughout  Abdomen: soft, nontender, nondistended, +BS in all 4 quadrants,  Skin: warm, dry, intact, no open lesions or rashes noted Extremities: pulses intact b/l, 2+ pitting edema, clubbing or cyanosis, no calf tenderness Neurologic: no focal deficits     Lab results: Results for orders placed or performed during the hospital encounter of 03/15/15 (from the past 24 hour(s))  CBC with Differential/Platelet     Status: Abnormal   Collection Time: 03/15/15  5:15 PM  Result Value Ref Range   WBC 9.5 4.0 - 10.5 K/uL   RBC 3.83 (L) 3.87 - 5.11 MIL/uL   Hemoglobin 11.7 (L) 12.0 - 15.0 g/dL   HCT 40.5 36.0 - 46.0 %   MCV 105.7 (H) 78.0 - 100.0 fL   MCH 30.5 26.0 - 34.0 pg   MCHC 28.9 (L) 30.0 - 36.0 g/dL   RDW 14.5 11.5 - 15.5 %   Platelets 160 150 - 400 K/uL   Neutrophils Relative % 82 %   Neutro Abs 7.8 (H) 1.7 - 7.7 K/uL   Lymphocytes Relative 6 %   Lymphs Abs 0.5 (L) 0.7 - 4.0 K/uL   Monocytes Relative 11 %   Monocytes Absolute 1.0 0.1 - 1.0 K/uL   Eosinophils Relative 1 %   Eosinophils Absolute 0.1 0.0 - 0.7 K/uL   Basophils Relative 0 %   Basophils Absolute 0.0 0.0 - 0.1 K/uL  Basic metabolic panel     Status: Abnormal   Collection Time: 03/15/15  5:15 PM  Result Value Ref Range   Sodium 138 135 - 145 mmol/L   Potassium 4.2 3.5 - 5.1 mmol/L   Chloride 96 (L) 101 - 111 mmol/L   CO2 38 (H) 22 - 32 mmol/L   Glucose, Bld 133 (H) 65 - 99 mg/dL   BUN <5 (L) 6 - 20 mg/dL   Creatinine, Ser 0.68 0.44 - 1.00 mg/dL   Calcium 8.2 (L) 8.9 - 10.3 mg/dL   GFR calc non Af Amer >60 >60 mL/min   GFR calc Af Amer >60 >60 mL/min   Anion gap 4 (L) 5 - 15  Brain natriuretic peptide     Status: Abnormal   Collection Time: 03/15/15  5:15 PM  Result Value Ref Range   B Natriuretic Peptide 996.2 (H) 0.0 - 100.0 pg/mL  I-stat troponin, ED     Status: None   Collection Time: 03/15/15  5:20 PM  Result Value Ref Range   Troponin i,  poc 0.08 0.00 - 0.08 ng/mL   Comment 3          I-Stat CG4 Lactic Acid, ED  (not at  [facility])     Status: None   Collection Time: 03/15/15  6:09 PM  Result Value Ref Range   Lactic Acid, Venous 0.72 0.5 - 2.0 mmol/L  MRSA PCR Screening     Status: None   Collection Time: 03/15/15  8:54 PM  Result Value Ref Range   MRSA by PCR NEGATIVE NEGATIVE     Imaging results:  Dg Chest 2 View  03/15/2015  CLINICAL DATA:  59 year old female with shortness of breath and cough for 1 day. Hyper metabolic left lower lobe tumor on PET-CT in August. Subsequent encounter. EXAM: CHEST  2 VIEW COMPARISON:  03/03/2015 and earlier FINDINGS: Confluent left lung base opacity in the area of the hypermetabolic tumor, lateral basal segment left lower lobe. On CTA in November pneumonia surrounded the mass. No pleural effusion. Cardiomegaly and mediastinal lipomatosis. Right lung base appears stable. No pneumothorax or pulmonary edema. Osteopenia. No acute osseous abnormality identified. Extensive surgical clips in the epigastrium again noted. IMPRESSION: 1. Continued left lower lobe pneumonia since November, probably postobstructive in relation to known left lower lobe hypermetabolic tumor. No pleural effusion. 2. No new No acute cardiopulmonary abnormality. Electronically Signed   By: Genevie Ann M.D.   On: 03/15/2015 17:22    Other results: EKG: sinus tach  Assessment & Plan by Problem: Active Problems:   HCAP (healthcare-associated pneumonia)  Shortness of breath most likely due to COPD exacerbation vs chronic worsening of HF (or both) vs radiation pneumonitis:  Patient with acute onset of shortness of breath this morning, who is on home 4L of oxygen with recent admissions for copd exacerbation and postobstructive pneumonia. She has frequent exacerbations of her COPD and she is on home 4L oxygen which makes me think to be the most likely diagnosis.  She could have continuing non-resolving pneumonia but  Labs showed no  leukocytosis, and she was afebrile, and she had no sputum production, but her cxr showing continuing left lower lobe pneumonia- so I think she is less likely to have this. . She could also have worsening of her heart failure as Her BNP has slowly crept up from 86 1 month ago to 996 today and she is not that compliant with her lasix but she did not feel short of breath after she was given a liter of bolus int he ER and CXR showed no  acute cardiopulmonary edema. On exam she had diffuse crackles and wheezing and 2+ pitting edema which the patient says is chronic.  Another differential is PE, as she was acutely short of breath and tachycardic and she has active cancer going on and her wells score 2.5, But she had a CTA done last month which was negative.  Another differential could be the radiation pneumonitis, Symptoms caused by subacute radiation pneumonitis usually develop approximately four to twelve weeks following irradiation and include dyspnea. She finished radiation 11/23 Worsening of her malignancy is also on differential   -ordered procalcitonin to differentiate the etiologies  -hold antibiotics, received vanc and zosyn in the ER- but not continuing as we don't think she has HCAP  -prednisone 40 mg- may want to give her for an extended time this time- for 10-14 days  -albuterol, duonebs, dulera, spiriva  -mucinex, and robitussin  -AM CBC and BMP -daily weights -monitor I&O -monitor pulse ox    Acute on chronic congestive heart failure: Pt with rising BNP over a month, who is  Noncompliant with her home lasix, however cxr showing no cardiopulmonary edema, and she has 2+ pitting edema and crackles on exam. Per patient, no weight changes recently.  -can reconsider starting home lasix tomorrow as her blood pressure was soft today at 80s/60s so holding lasix  -monitor I&O -daily weights  H/o Stage 2a NSCLC s/p radiation therapy completed 11/23: Followed by Dr. Pablo Ledger ([facility]).  GERD- Protonix  HLD- pravastatin       Dispo: Disposition is deferred at this time, awaiting improvement of current medical problems. Anticipated discharge in approximately 2 day(s).   The patient does have a current PCP Oval Linsey, MD) and does need an [facility] hospital follow-up appointment after discharge.  The patient does not have transportation limitations that hinder transportation to clinic appointments.  Signed: Burgess Estelle, MD 03/15/2015, 9:08 PM

## 2015-03-15 NOTE — ED Provider Notes (Signed)
CSN: [id number]     Arrival date & time 03/15/15  1559 History   First MD Initiated Contact with Patient 03/15/15 1607     Chief Complaint  Patient presents with  . Shortness of Breath     (Consider location/radiation/quality/duration/timing/severity/associated sxs/prior Treatment) HPI Comments: Patient with PMH of CAD pulmonary HTN, CHF, lung cancer, COPD, presents to the ED with a chief complaint of cough and wheezing.  She states that she began wheezing while moving today.  States that it came on suddenly.  She reports that she has been coughing for the past week or so.  She reports associated chest tightness.  She has not tried taking anything for her symptoms.  She states that she is on 4L home O2.  She denies any associated fevers, chills, nausea, or vomiting.   The history is provided by the patient. No language interpreter was used.    Past Medical History  Diagnosis Date  . Coronary artery disease     S/P PCI of LAD with DES (12/2008). Total occlusion of RCA noted at that time., medically managed. ACS ruled out 03/2009 with Lexiscan myoview . Followed by [facility].  . Pulmonary hypertension ([place])     2-D Echo (14/4315) - Systolic pressure was moderately increased. PA peak pressure  43mHg. 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 Followed by Dr. WJoya Gaskins(Velora Heckler  . Diastolic dysfunction     2-D Echo (12/2008) - Normal LV Systolic funciton with EF 60-65%. Grade 1 diastolid dysfunction. No regional wall motion abnormalities. Moderate pulmonary HTN with PA peak pressure 531mg.  . Marland KitchenOPD (chronic obstructive pulmonary disease) (HCC)     Severe. Gold Stage IV.  PFTs (12/2008) - severe obstructive airway disease. Active tobacco use. Requires 4L O2 at home.  . Pulmonary nodule, right     Small right middle lobe nodule. Stable as of 12/2008.  . Marland Kitchenrediabetes     HgbA1c 6.4 (12/2008)  .  Hx MRSA infection     Recurrent MRSA thigh abscesses.  . Obesity   . Hyperlipidemia   . GERD (gastroesophageal reflux disease)     S/P Nissen fundoplication.  . [place]estive heart failure) [place]  . On home oxygen therapy since 2010    4L all the time  . Shortness of breath dyspnea     with a lot of exertion; if fluid builds up  . Pneumonia   . Depression   . Full dentures   . Chronic bronchitis ([place]    "get it most years" (03/03/2015)  . Lung cancer ([place]    "[place]"  . History of hiatal hernia   . Arthritis     "some scattered" (03/03/2015)   Past Surgical History  Procedure Laterality Date  . Nissen fundoplication    . Right heart catheterization N/A 11/09/2012    Procedure: RIGHT HEART CATH;  Surgeon: DaLarey DresserMD;  [facility];  Service: Cardiovascular;  Laterality: N/A;  . Appendectomy    . Tonsillectomy    . Back surgery    . Knee arthroscopy Right ~ 2000  . Tubal ligation  1979  . Video bronchoscopy with endobronchial ultrasound N/A 12/15/2014    Procedure: VIDEO BRONCHOSCOPY WITH ENDOBRONCHIAL ULTRASOUND;  Surgeon: PeIvin PootMD;  [facility] Service: Thoracic;  Laterality: N/A;  . [facility]  [facility]  .  Bilateral oophorectomy  1979  . Hernia repair    . Breast lumpectomy Right 1990's     (biopsy negative)  . Lumbar disc surgery  2001    "herniated discs; both by [facility], Dr. Lorin Mercy"  . Abdominal hysterectomy  1980's    "still have my cervix"   Family History  Problem Relation Age of Onset  . Heart disease Mother 43    Deceased from MI at 30yo  . Hypertension Mother   . Heart disease Father 72    Deceased of MI age 37yo  . Hypertension Father   . Hypertension Brother   . Lung cancer      Grandmother   Social History  Substance Use Topics  . Smoking status: Current Every Day Smoker -- 2.00 packs/day for 45 years    Types: Cigarettes    Start date: 04/04/1969  . Smokeless  tobacco: Never Used  . Alcohol Use: No   OB History    No data available     Review of Systems  Constitutional: Negative for fever and chills.  Respiratory: Positive for cough, chest tightness, shortness of breath and wheezing.   Cardiovascular: Negative for chest pain.  Gastrointestinal: Negative for nausea, vomiting, diarrhea and constipation.  Genitourinary: Negative for dysuria.  All other systems reviewed and are negative.     Allergies  Fluconazole and Atorvastatin  Home Medications   Prior to Admission medications   Medication Sig Start Date End Date Taking? Authorizing Provider  acetaminophen (TYLENOL) 500 MG tablet Take 500 mg by mouth every 6 (six) hours as needed for moderate pain.     Historical Provider, MD  albuterol (PROVENTIL) (2.5 MG/3ML) 0.083% nebulizer solution Take 3 mLs (2.5 mg total) by nebulization every 6 (six) hours as needed for shortness of breath. 09/05/14   Oval Linsey, MD  albuterol (VENTOLIN HFA) 108 (90 BASE) MCG/ACT inhaler Inhale 2 puffs into the lungs every 6 (six) hours as needed for shortness of breath. 12/05/14   Oval Linsey, MD  aspirin 81 MG chewable tablet Chew 1 tablet (81 mg total) by mouth at bedtime. 01/16/15   Oval Linsey, MD  Fluticasone-Salmeterol (ADVAIR DISKUS) 250-50 MCG/DOSE AEPB Inhale 1 puff into the lungs 2 (two) times daily. 09/05/14   Dellia Nims, MD  furosemide (LASIX) 40 MG tablet Take 1 tablet (40 mg total) by mouth daily as needed for fluid. 01/16/15   Oval Linsey, MD  gabapentin (NEURONTIN) 300 MG capsule Take 1 capsule (300 mg total) by mouth 3 (three) times daily. Patient taking differently: Take 300-600 mg by mouth at bedtime.  01/16/15   Oval Linsey, MD  lovastatin (MEVACOR) 40 MG tablet Take 1 tablet (40 mg total) by mouth at bedtime. 09/11/14 09/11/15  Corky Sox, MD  Naproxen Sodium (ALEVE) 220 MG CAPS Take 220 mg by mouth 2 (two) times daily as needed (pain).    Historical Provider, MD  pantoprazole  (PROTONIX) 40 MG tablet Take 1 tablet (40 mg total) by mouth daily. 12/05/14   Oval Linsey, MD  pantoprazole (PROTONIX) 40 MG tablet Take 1 tablet (40 mg total) by mouth daily. 03/06/15   Iline Oven, MD  potassium chloride SA (K-DUR,KLOR-CON) 20 MEQ tablet Take 2 tablets (40 mEq total) by mouth daily as needed (Take when taking lasix). Patient taking differently: Take 20 mEq by mouth daily as needed (Take when taking lasix).  01/16/15   Oval Linsey, MD  predniSONE (DELTASONE) 20 MG tablet Take 2 tablets (40 mg total) by  mouth daily with breakfast. Patient not taking: Reported on 03/09/2015 03/06/15   Iline Oven, MD  Probiotic Product (PROBIOTIC DAILY PO) Take 1 capsule by mouth daily.    Historical Provider, MD  tiotropium (SPIRIVA HANDIHALER) 18 MCG inhalation capsule Place 1 capsule (18 mcg total) into inhaler and inhale daily. 09/05/14   Tasrif Ahmed, MD   BP 91/59 mmHg  Pulse 96  Temp(Src) 98.9 F (37.2 C) (Oral)  Resp 17  Ht _0  (1.651 m)  Wt 113.399 kg  BMI 41.60 kg/m2  SpO2 90% Physical Exam  Constitutional: She is oriented to person, place, and time. She appears well-developed and well-nourished.  HENT:  Head: Normocephalic and atraumatic.  Eyes: Conjunctivae and EOM are normal. Pupils are equal, round, and reactive to light.  Neck: Normal range of motion. Neck supple.  Cardiovascular: Normal rate and regular rhythm.  Exam reveals no gallop and no friction rub.   No murmur heard. Pulmonary/Chest: Effort normal. No respiratory distress. She has wheezes. She has rales. She exhibits no tenderness.  Abdominal: Soft. Bowel sounds are normal. She exhibits no distension and no mass. There is no tenderness. There is no rebound and no guarding.  Musculoskeletal: Normal range of motion. She exhibits no edema or tenderness.  Neurological: She is alert and oriented to person, place, and time.  Skin: Skin is warm and dry.  Psychiatric: She has a normal mood and affect. Her  behavior is normal. Judgment and thought content normal.  Nursing note and vitals reviewed.   ED Course  Procedures (including critical care time) Results for orders placed or performed during the hospital encounter of 03/15/15  CBC with Differential/Platelet  Result Value Ref Range   WBC 9.5 4.0 - 10.5 K/uL   RBC 3.83 (L) 3.87 - 5.11 MIL/uL   Hemoglobin 11.7 (L) 12.0 - 15.0 g/dL   HCT 40.5 36.0 - 46.0 %   MCV 105.7 (H) 78.0 - 100.0 fL   MCH 30.5 26.0 - 34.0 pg   MCHC 28.9 (L) 30.0 - 36.0 g/dL   RDW 14.5 11.5 - 15.5 %   Platelets 160 150 - 400 K/uL   Neutrophils Relative % 82 %   Neutro Abs 7.8 (H) 1.7 - 7.7 K/uL   Lymphocytes Relative 6 %   Lymphs Abs 0.5 (L) 0.7 - 4.0 K/uL   Monocytes Relative 11 %   Monocytes Absolute 1.0 0.1 - 1.0 K/uL   Eosinophils Relative 1 %   Eosinophils Absolute 0.1 0.0 - 0.7 K/uL   Basophils Relative 0 %   Basophils Absolute 0.0 0.0 - 0.1 K/uL  Basic metabolic panel  Result Value Ref Range   Sodium 138 135 - 145 mmol/L   Potassium 4.2 3.5 - 5.1 mmol/L   Chloride 96 (L) 101 - 111 mmol/L   CO2 38 (H) 22 - 32 mmol/L   Glucose, Bld 133 (H) 65 - 99 mg/dL   BUN <5 (L) 6 - 20 mg/dL   Creatinine, Ser 0.68 0.44 - 1.00 mg/dL   Calcium 8.2 (L) 8.9 - 10.3 mg/dL   GFR calc non Af Amer >60 >60 mL/min   GFR calc Af Amer >60 >60 mL/min   Anion gap 4 (L) 5 - 15  Brain natriuretic peptide  Result Value Ref Range   B Natriuretic Peptide 996.2 (H) 0.0 - 100.0 pg/mL  I-stat troponin, ED  Result Value Ref Range   Troponin i, poc 0.08 0.00 - 0.08 ng/mL   Comment 3  I-Stat CG4 Lactic Acid, ED  (not at  [facility])  Result Value Ref Range   Lactic Acid, Venous 0.72 0.5 - 2.0 mmol/L   Dg Chest 2 View  03/15/2015  CLINICAL DATA:  59 year old female with shortness of breath and cough for 1 day. Hyper metabolic left lower lobe tumor on PET-CT in August. Subsequent encounter. EXAM: CHEST  2 VIEW COMPARISON:  03/03/2015 and earlier FINDINGS: Confluent left lung  base opacity in the area of the hypermetabolic tumor, lateral basal segment left lower lobe. On CTA in November pneumonia surrounded the mass. No pleural effusion. Cardiomegaly and mediastinal lipomatosis. Right lung base appears stable. No pneumothorax or pulmonary edema. Osteopenia. No acute osseous abnormality identified. Extensive surgical clips in the epigastrium again noted. IMPRESSION: 1. Continued left lower lobe pneumonia since November, probably postobstructive in relation to known left lower lobe hypermetabolic tumor. No pleural effusion. 2. No new No acute cardiopulmonary abnormality. Electronically Signed   By: Genevie Ann M.D.   On: 03/15/2015 17:22   Dg Chest 2 View  03/03/2015  CLINICAL DATA:  Shortness of breath for several days. Right-sided chest pain for 1 day. History of lung carcinoma on the left. EXAM: CHEST  2 VIEW COMPARISON:  Chest radiograph February 15, 2015 and chest CT February 15, 2015 FINDINGS: There is persistent left lower lobe airspace consolidation. There is new patchy consolidation in medial right base. Lungs elsewhere clear. Heart is mildly enlarged but stable. Pulmonary vascularity is within normal limits. No adenopathy apparent. No bone lesions. IMPRESSION: Persistent left lower lobe airspace consolidation consistent with pneumonia. New patchy airspace consolidation medial right base. Stable cardiac prominence. Electronically Signed   By: Lowella Grip III M.D.   On: 03/03/2015 15:26   Dg Chest 2 View  02/15/2015  CLINICAL DATA:  Increased shortness of breath, left lower lobe mass EXAM: CHEST  2 VIEW COMPARISON:  01/27/2015 FINDINGS: Cardiomegaly evident with chronic vascular congestion. Large left lower lobe masslike opacity persists, better demonstrated on the CT comparison. Right lung remains clear. No superimposed edema or pneumothorax. Trachea midline. IMPRESSION: Cardiomegaly with vascular congestion Left lower lobe masslike opacity, stable to minimal improvement  by plain radiography. Patient is undergoing radiation. Electronically Signed   By: Jerilynn Mages.  Shick M.D.   On: 02/15/2015 14:40   Dg Tibia/fibula Left  02/15/2015  CLINICAL DATA:  Pt reports 45 minutes PTA she was stepping down step from bathroom and left foot/leg gave out,laid on floor for 20 minutes before grandson got up to help her off floor. Pain to left ankle, unable to bear full weight on left side. Pt reports she has had increase in shortness of breath with rest. Pt usually only short of breath with exertion EXAM: LEFT TIBIA AND FIBULA - 2 VIEW COMPARISON:  None. FINDINGS: No convincing fracture. No bone lesion. Knee and ankle joints are normally aligned. There is diffuse subcutaneous soft tissue edema throughout the leg most prominently involving the distal leg and ankle. IMPRESSION: No fracture or dislocation. Electronically Signed   By: Lajean Manes M.D.   On: 02/15/2015 14:37   Dg Ankle Complete Left  02/15/2015  CLINICAL DATA:  Fall when stepping down from bathroom with left ankle pain. Unable to bear weight. EXAM: LEFT ANKLE COMPLETE - 3+ VIEW COMPARISON:  07/01/2013 and 10/24/2011 FINDINGS: Moderate soft tissue swelling over the ankle. Ankle mortise is within normal. Findings suggesting an old distal fibular fracture. No acute fracture or dislocation. Minimal degenerative change over the midfoot/hindfoot. Spurring over the posterior  and inferior calcaneus. IMPRESSION: No acute fracture. Electronically Signed   By: Marin Olp M.D.   On: 02/15/2015 14:37   Ct Angio Chest Pe W/cm &/or Wo Cm  02/15/2015  ADDENDUM REPORT: 02/15/2015 18:11 ADDENDUM: The previously noted left lower lobe lung mass has increased in size, from 5.5 cm to 6.3 cm. Surrounding new lower attenuation airspace consolidation most likely reflects postobstructive pneumonia. Electronically Signed   By: Garald Balding M.D.   On: 02/15/2015 18:11  02/15/2015  CLINICAL DATA:  History of lung carcinoma with increasing shortness of  Breath EXAM: CT ANGIOGRAPHY CHEST WITH CONTRAST TECHNIQUE: Multidetector CT imaging of the chest was performed using the standard protocol during bolus administration of intravenous contrast. Multiplanar CT image reconstructions and MIPs were obtained to evaluate the vascular anatomy. CONTRAST:  61m OMNIPAQUE IOHEXOL 350 MG/ML SOLN COMPARISON:  Chest x-ray from earlier in the same day FINDINGS: Lungs are well aerated bilaterally. Left lower lobe pneumonia is identified with dense consolidation. No other focal infiltrate is noted. No sizable parenchymal lesions are seen. The thoracic inlet is within normal limits. Calcific changes of the thoracic aorta are noted without aneurysmal dilatation or dissection. The pulmonary artery demonstrates a normal branching pattern. No filling defects to suggest pulmonary emboli are identified. No significant hilar or mediastinal adenopathy is noted. Upper abdomen and bony structures are within normal limits. Postsurgical changes are noted about the gastroesophageal junction. Review of the MIP images confirms the above findings. IMPRESSION: Left lower lobe pneumonia. No evidence of pulmonary embolism. Electronically Signed: By: MInez CatalinaM.D. On: 02/15/2015 17:03    I have personally reviewed and evaluated these images and lab results as part of my medical decision-making.   EKG Interpretation   Date/Time:  Sunday March 15 2015 16:18:26 EST Ventricular Rate:  93 PR Interval:  128 QRS Duration: 97 QT Interval:  344 QTC Calculation: 428 R Axis:   115 Text Interpretation:  Sinus rhythm Right axis deviation Repol abnrm  suggests ischemia, inferior leads No significant change since last tracing  Confirmed by PMaryan Rued MD, WLoree Fee([id number] on 03/15/2015 4:54:35 PM      MDM   Final diagnoses:  HCAP (healthcare-associated pneumonia)    Patient with cough, wheezing, and SOB.  Will give nebs and reassess.  1745 notified that patient is more hypotensive.   85/61.  CXR reviewed and shows persistent pneumonia.  Patient seen by and discussed with Dr. PMaryan Rued who recommends treating for sepsis. Will start weight based fluid bolus over concern for sepsis.  HCAP as probable source.  Patient discussed with Dr. RMarvel Planfrom IM.  Teaching Service to admit.  Continue fluids and abx.  Ease of fluids if pressures stabilize 2/2 elevated BNP.   RMontine Circle PA-C 03/15/15 1838  WBlanchie Dessert MD 03/16/15 [phone]

## 2015-03-15 NOTE — Progress Notes (Signed)
Utilization review completed

## 2015-03-16 ENCOUNTER — Ambulatory Visit: Payer: Commercial Managed Care - HMO | Admitting: *Deleted

## 2015-03-16 ENCOUNTER — Encounter: Payer: Commercial Managed Care - HMO | Admitting: Internal Medicine

## 2015-03-16 LAB — BASIC METABOLIC PANEL
Anion gap: 8 (ref 5–15)
CHLORIDE: 99 mmol/L — AB (ref 101–111)
CO2: 32 mmol/L (ref 22–32)
CREATININE: 0.73 mg/dL (ref 0.44–1.00)
Calcium: 8.6 mg/dL — ABNORMAL LOW (ref 8.9–10.3)
GFR calc Af Amer: >60 mL/min (ref >60)
GFR calc non Af Amer: >60 mL/min (ref >60)
GLUCOSE: 186 mg/dL — AB (ref 65–99)
POTASSIUM: 5.1 mmol/L (ref 3.5–5.1)
Sodium: 139 mmol/L (ref 135–145)

## 2015-03-16 LAB — CBC
HEMATOCRIT: 43.4 % (ref 36.0–46.0)
Hemoglobin: 12.4 g/dL (ref 12.0–15.0)
MCH: 30.4 pg (ref 26.0–34.0)
MCHC: 28.6 g/dL — AB (ref 30.0–36.0)
MCV: 106.4 fL — AB (ref 78.0–100.0)
PLATELETS: 168 10*3/uL (ref 150–400)
RBC: 4.08 MIL/uL (ref 3.87–5.11)
RDW: 14.6 % (ref 11.5–15.5)
WBC: 8.2 10*3/uL (ref 4.0–10.5)

## 2015-03-16 LAB — INFLUENZA PANEL BY PCR (TYPE A & B)
H1N1FLUPCR: NOT DETECTED
Influenza A By PCR: NEGATIVE
Influenza B By PCR: NEGATIVE

## 2015-03-16 MED ORDER — AZITHROMYCIN 500 MG PO TABS
500.0000 mg | ORAL_TABLET | Freq: Every day | ORAL | Status: DC
  Administered 2015-03-16 – 2015-03-17 (×2): 500 mg via ORAL
  Filled 2015-03-16 (×2): qty 1

## 2015-03-16 MED ORDER — IPRATROPIUM-ALBUTEROL 0.5-2.5 (3) MG/3ML IN SOLN: 3.0000 mL | RESPIRATORY_TRACT | Status: DC | PRN

## 2015-03-16 MED ORDER — IPRATROPIUM-ALBUTEROL 0.5-2.5 (3) MG/3ML IN SOLN
3.0000 mL | Freq: Three times a day (TID) | RESPIRATORY_TRACT | Status: DC
  Administered 2015-03-17: 3 mL via RESPIRATORY_TRACT
  Filled 2015-03-16: qty 3

## 2015-03-16 NOTE — Progress Notes (Signed)
Internal Medicine Attending  Date: 03/16/2015  Patient name: [name] Medical record number: [id number] Date of birth: 07/04/1955 Age: 59 y.o. Gender: female  I saw and evaluated the patient. I reviewed the resident's note by Dr. Lovena Le and I agree with the resident's findings and plans as documented in his progress note.  Our impression is that she has a chronic obstructive pulmonary disease exacerbation related to an acute bronchitis or an atypical community-acquired pneumonia. She is being treated with appropriate antibiotics as well as steroids and bronchodilators. Please see my H&P dated 03/16/2015 and attached to Dr. Sherlynn Carbon H&P dated 03/15/2015 for the specifics of my evaluation, assessment, and plan from earlier this morning.

## 2015-03-16 NOTE — Progress Notes (Signed)
Subjective: [name]  Patient complaining of continued fevers, HA, and minimally increased cough.  Symptoms started while she was being moved out of her house, when it became "[place]" due to the police needing to be called.  The cough is productive of clear sputum. She has not been around anyone else who hs been sick.  She is comfortably lying near supine in bed.  Objective: Vital signs in last 24 hours: Filed Vitals:   03/15/15 1930 03/16/15 0539 03/16/15 0822 03/16/15 1021  BP: 102/57   114/55  Pulse: 88  88 92  Temp:    98.6 F (37 C)  TempSrc:    Oral  Resp: _0 Height:      Weight:    260 lb 2.3 oz (118 kg)  SpO2: 97% 91% 96% 96%   Weight change:   Intake/Output Summary (Last 24 hours) at 03/16/15 1045 Last data filed at 03/16/15 1022  Gross per 24 hour  Intake    240 ml  Output      0 ml  Net    240 ml   Physical Exam  Constitutional: She is oriented to person, place, and time and well-developed, well-nourished, and in no distress. No distress.  Lying near supine in bed, NAD  HENT:  Head: Normocephalic and atraumatic.  Eyes: EOM are normal. No scleral icterus.  Neck: No JVD present. No tracheal deviation present.  Cardiovascular: Normal rate, regular rhythm and normal heart sounds.   Pulmonary/Chest: Effort normal and breath sounds normal. No stridor. No respiratory distress. She has no wheezes.  No crackles.  Abdominal: Soft. She exhibits no distension. There is no tenderness. There is no rebound and no guarding.  Protuberant, but not distended or TTP  Musculoskeletal: Normal range of motion.  1+ edema to midshin.  Neurological: She is alert and oriented to person, place, and time.  Skin: Skin is warm and dry. No rash noted. She is not diaphoretic. No erythema.    Lab Results: Basic Metabolic Panel:  Recent Labs Lab 03/15/15 1715 03/16/15 0740  NA 138 139  K 4.2 5.1  CL 96* 99*  CO2 38* 32  GLUCOSE 133* 186*  BUN <5* <5*  CREATININE 0.68  0.73  CALCIUM 8.2* 8.6*   Liver Function Tests: No results for input(s): AST, ALT, ALKPHOS, BILITOT, PROT, ALBUMIN in the last 168 hours. No results for input(s): LIPASE, AMYLASE in the last 168 hours. No results for input(s): AMMONIA in the last 168 hours. CBC:  Recent Labs Lab 03/15/15 1715 03/16/15 0740  WBC 9.5 8.2  NEUTROABS 7.8*  --   HGB 11.7* 12.4  HCT 40.5 43.4  MCV 105.7* 106.4*  PLT 160 168   Cardiac Enzymes: No results for input(s): CKTOTAL, CKMB, CKMBINDEX, TROPONINI in the last 168 hours. BNP: No results for input(s): PROBNP in the last 168 hours. D-Dimer: No results for input(s): DDIMER in the last 168 hours. CBG: No results for input(s): GLUCAP in the last 168 hours. Hemoglobin A1C: No results for input(s): HGBA1C in the last 168 hours. Fasting Lipid Panel: No results for input(s): CHOL, HDL, LDLCALC, TRIG, CHOLHDL, LDLDIRECT in the last 168 hours. Thyroid Function Tests: No results for input(s): TSH, T4TOTAL, FREET4, T3FREE, THYROIDAB in the last 168 hours. Coagulation: No results for input(s): LABPROT, INR in the last 168 hours. Anemia Panel: No results for input(s): VITAMINB12, FOLATE, FERRITIN, TIBC, IRON, RETICCTPCT in the last 168 hours. Urine Drug Screen: Drugs of Abuse  Component Value Date/Time   LABOPIA NONE DETECTED 07/01/2013 1218   LABOPIA NEGATIVE 02/04/2009 2315   COCAINSCRNUR NONE DETECTED 07/01/2013 1218   COCAINSCRNUR NEGATIVE 02/04/2009 2315   LABBENZ NONE DETECTED 07/01/2013 1218   LABBENZ NEGATIVE 02/04/2009 2315   AMPHETMU NONE DETECTED 07/01/2013 1218   AMPHETMU NEGATIVE 02/04/2009 2315   THCU NONE DETECTED 07/01/2013 1218   LABBARB NONE DETECTED 07/01/2013 1218    Alcohol Level: No results for input(s): ETH in the last 168 hours. Urinalysis: No results for input(s): COLORURINE, LABSPEC, PHURINE, GLUCOSEU, HGBUR, BILIRUBINUR, KETONESUR, PROTEINUR, UROBILINOGEN, NITRITE, LEUKOCYTESUR in the last 168 hours.  Invalid  input(s): APPERANCEUR Misc. Labs:   Micro Results: Recent Results (from the past 240 hour(s))  MRSA PCR Screening     Status: None   Collection Time: 03/15/15  8:54 PM  Result Value Ref Range Status   MRSA by PCR NEGATIVE NEGATIVE Final    Comment:        The GeneXpert MRSA Assay (FDA approved for NASAL specimens only), is one component of a comprehensive MRSA colonization surveillance program. It is not intended to diagnose MRSA infection nor to guide or monitor treatment for MRSA infections.    Studies/Results: Dg Chest 2 View  03/15/2015  CLINICAL DATA:  59 year old female with shortness of breath and cough for 1 day. Hyper metabolic left lower lobe tumor on PET-CT in August. Subsequent encounter. EXAM: CHEST  2 VIEW COMPARISON:  03/03/2015 and earlier FINDINGS: Confluent left lung base opacity in the area of the hypermetabolic tumor, lateral basal segment left lower lobe. On CTA in November pneumonia surrounded the mass. No pleural effusion. Cardiomegaly and mediastinal lipomatosis. Right lung base appears stable. No pneumothorax or pulmonary edema. Osteopenia. No acute osseous abnormality identified. Extensive surgical clips in the epigastrium again noted. IMPRESSION: 1. Continued left lower lobe pneumonia since November, probably postobstructive in relation to known left lower lobe hypermetabolic tumor. No pleural effusion. 2. No new No acute cardiopulmonary abnormality. Electronically Signed   By: Genevie Ann M.D.   On: 03/15/2015 17:22   Medications: I have reviewed the patient's current medications. Scheduled Meds: . aspirin  81 mg Oral QHS  . azithromycin  500 mg Oral Daily  . enoxaparin (LOVENOX) injection  40 mg Subcutaneous Q24H  . gabapentin  300 mg Oral QHS  . guaiFENesin  600 mg Oral BID  . ipratropium-albuterol  3 mL Nebulization Q4H  . mometasone-formoterol  2 puff Inhalation BID  . pantoprazole  40 mg Oral Daily  . piperacillin-tazobactam (ZOSYN)  IV  3.375 g  Intravenous 3 times per day  . pravastatin  40 mg Oral q1800  . predniSONE  40 mg Oral Q breakfast  . sodium chloride  3 mL Intravenous Q12H  . tiotropium  18 mcg Inhalation Daily   Continuous Infusions:  PRN Meds:.acetaminophen, acetaminophen, guaiFENesin-dextromethorphan Assessment/Plan: Principal Problem:   COPD exacerbation ([place]) Active Problems:   Severe chronic obstructive pulmonary disease (HCC)   Chronic diastolic CHF (congestive heart failure) ([place])   HCAP (healthcare-associated pneumonia)  Ms. Lucatero is a 59 yo woman with PMH of Stage 2 NSCLC (completed radiation therapy 11/23), COPD (4L [place] at home, FEV1 42%, FEV1/FVC 88% 12/02/14), pulmonary HTN, CAD s/p DES 2010, CHF (EF 50-55%), BPPV, and B12 deficiency.  Acute COPD Exacerbation, possibly exacerbated by viral or atypical PNA: Patient with acute onset SOB, likely worsened by longstanding end-stage COPD with/without radiation pneumonitis.  She has been afebrile without leukocytosis, but with increased cough and questionably increased sputum  production.  CXR showing continuing left lower lobe opacity, which is continued tumor vs post-obstructive PNA. Despite increased BNP, she does not appear clinically hypervolemic and CXR showed no acute cardiopulmonary edema. PE should be considered, as she was acutely short of breath and tachycardic and she has active cancer. Wells score 2.5. CTA done last month which was negative. She finished radiation 11/23.  Given her possibly increased cough/sputum, it is reasonable to cover for atypical PNA with Azithromycin. - Azithromycin 500 mg, then 250 mg daily - Prednisone 40 mg daily - Albuterol, duonebs, dulera, spiriva  - Mucinex, and robitussin  - Daily weights - I/Os  HFpEF: Pt with rising BNP over a month. CXR showing no cardiopulmonary edema. Does not clinically appear hypervolemic.  CTM - Lasix 20 mg daily - I/O - Daily weights  H/o Stage 2a NSCLC s/p radiation therapy completed  11/23: Followed by Dr. Pablo Ledger ([facility]).  GERD- Protonix HLD- pravastatin   Dispo: Disposition is deferred at this time, awaiting improvement of current medical problems.  Anticipated discharge in approximately 1-2 day(s).   The patient does have a current PCP Oval Linsey, MD) and does need an [facility] hospital follow-up appointment after discharge.  The patient does not have transportation limitations that hinder transportation to clinic appointments.  .Services Needed at time of discharge: Y = Yes, Blank = No PT:   OT:   RN:   Equipment:   Other:     LOS: 1 day   Iline Oven, MD 03/16/2015, 10:45 AM

## 2015-03-17 DIAGNOSIS — C3490 Malignant neoplasm of unspecified part of unspecified bronchus or lung: Secondary | ICD-10-CM

## 2015-03-17 DIAGNOSIS — Z7951 Long term (current) use of inhaled steroids: Secondary | ICD-10-CM

## 2015-03-17 DIAGNOSIS — E785 Hyperlipidemia, unspecified: Secondary | ICD-10-CM

## 2015-03-17 DIAGNOSIS — K219 Gastro-esophageal reflux disease without esophagitis: Secondary | ICD-10-CM

## 2015-03-17 DIAGNOSIS — Z923 Personal history of irradiation: Secondary | ICD-10-CM

## 2015-03-17 LAB — CBC WITH DIFFERENTIAL/PLATELET
BASOS ABS: 0 10*3/uL (ref 0.0–0.1)
Basophils Relative: 0 %
EOS PCT: 1 %
Eosinophils Absolute: 0.1 10*3/uL (ref 0.0–0.7)
HEMATOCRIT: 39.6 % (ref 36.0–46.0)
Hemoglobin: 11.5 g/dL — ABNORMAL LOW (ref 12.0–15.0)
LYMPHS PCT: 7 %
Lymphs Abs: 0.6 10*3/uL — ABNORMAL LOW (ref 0.7–4.0)
MCH: 30.7 pg (ref 26.0–34.0)
MCHC: 29 g/dL — AB (ref 30.0–36.0)
MCV: 105.9 fL — AB (ref 78.0–100.0)
MONO ABS: 0.4 10*3/uL (ref 0.1–1.0)
MONOS PCT: 5 %
NEUTROS ABS: 7.6 10*3/uL (ref 1.7–7.7)
Neutrophils Relative %: 87 %
PLATELETS: 167 10*3/uL (ref 150–400)
RBC: 3.74 MIL/uL — ABNORMAL LOW (ref 3.87–5.11)
RDW: 14.6 % (ref 11.5–15.5)
WBC: 8.7 10*3/uL (ref 4.0–10.5)

## 2015-03-17 LAB — BASIC METABOLIC PANEL
ANION GAP: 5 (ref 5–15)
CALCIUM: 8.4 mg/dL — AB (ref 8.9–10.3)
CO2: 38 mmol/L — AB (ref 22–32)
CREATININE: 0.78 mg/dL (ref 0.44–1.00)
Chloride: 94 mmol/L — ABNORMAL LOW (ref 101–111)
GFR calc Af Amer: >60 mL/min (ref >60)
GLUCOSE: 150 mg/dL — AB (ref 65–99)
Potassium: 4.2 mmol/L (ref 3.5–5.1)
Sodium: 137 mmol/L (ref 135–145)

## 2015-03-17 MED ORDER — IPRATROPIUM-ALBUTEROL 0.5-2.5 (3) MG/3ML IN SOLN
3.0000 mL | Freq: Four times a day (QID) | RESPIRATORY_TRACT | Status: DC
  Administered 2015-03-17 – 2015-03-18 (×5): 3 mL via RESPIRATORY_TRACT
  Filled 2015-03-17 (×5): qty 3

## 2015-03-17 MED ORDER — FUROSEMIDE 20 MG PO TABS
20.0000 mg | ORAL_TABLET | Freq: Every day | ORAL | Status: DC
  Administered 2015-03-18: 20 mg via ORAL
  Filled 2015-03-17: qty 1

## 2015-03-17 MED ORDER — FUROSEMIDE 40 MG PO TABS: 40.0000 mg | ORAL_TABLET | Freq: Every day | ORAL | Status: DC

## 2015-03-17 MED ORDER — FUROSEMIDE 40 MG PO TABS
40.0000 mg | ORAL_TABLET | Freq: Once | ORAL | Status: AC
  Administered 2015-03-17: 40 mg via ORAL
  Filled 2015-03-17: qty 1

## 2015-03-17 NOTE — Progress Notes (Signed)
Subjective: NAEON.  Patient states cough unchanged, productive of clear sputum.  She denies F/C or chest pain.  She is SOB when ambulating to bathroom on RA, but is not SOB when on her home 4L [place].  However, she desaturated to 82% on 4L while ambulating.    Objective: Vital signs in last 24 hours: Filed Vitals:   03/16/15 2029 03/16/15 2059 03/17/15 0440 03/17/15 0842  BP:  114/46 125/53   Pulse:  86 100   Temp:  98.5 F (36.9 C) 97.9 F (36.6 C)   TempSrc:  Oral Oral   Resp:  18 19   Height:      Weight:  260 lb 9.3 oz (118.2 kg)    SpO2: 93% 90% 90% 93%   Weight change: 10 lb 2.3 oz (4.601 kg)  Intake/Output Summary (Last 24 hours) at 03/17/15 0856 Last data filed at 03/17/15 0601  Gross per 24 hour  Intake    540 ml  Output    350 ml  Net    190 ml   Physical Exam  Constitutional: She is oriented to person, place, and time and well-developed, well-nourished, and in no distress. No distress.  Lying near supine in bed, NAD  HENT:  Head: Normocephalic and atraumatic.  Eyes: EOM are normal. No scleral icterus.  Neck: No JVD present. No tracheal deviation present.  Cardiovascular: Normal rate, regular rhythm and normal heart sounds.   Pulmonary/Chest: Effort normal and breath sounds normal. No stridor. No respiratory distress. She has no wheezes.  No crackles.  Abdominal: Soft. She exhibits no distension. There is no tenderness. There is no rebound and no guarding.  Protuberant, but not distended or TTP  Musculoskeletal: Normal range of motion.  2+ edema to midshin.  Neurological: She is alert and oriented to person, place, and time.  Skin: Skin is warm and dry. No rash noted. She is not diaphoretic. No erythema.    Lab Results: Basic Metabolic Panel:  Recent Labs Lab 03/16/15 0740 03/17/15 0525  NA 139 137  K 5.1 4.2  CL 99* 94*  CO2 32 38*  GLUCOSE 186* 150*  BUN <5* <5*  CREATININE 0.73 0.78  CALCIUM 8.6* 8.4*   Liver Function Tests: No results for  input(s): AST, ALT, ALKPHOS, BILITOT, PROT, ALBUMIN in the last 168 hours. No results for input(s): LIPASE, AMYLASE in the last 168 hours. No results for input(s): AMMONIA in the last 168 hours. CBC:  Recent Labs Lab 03/15/15 1715 03/16/15 0740 03/17/15 0525  WBC 9.5 8.2 8.7  NEUTROABS 7.8*  --  7.6  HGB 11.7* 12.4 11.5*  HCT 40.5 43.4 39.6  MCV 105.7* 106.4* 105.9*  PLT 160 168 167   Cardiac Enzymes: No results for input(s): CKTOTAL, CKMB, CKMBINDEX, TROPONINI in the last 168 hours. BNP: No results for input(s): PROBNP in the last 168 hours. D-Dimer: No results for input(s): DDIMER in the last 168 hours. CBG: No results for input(s): GLUCAP in the last 168 hours. Hemoglobin A1C: No results for input(s): HGBA1C in the last 168 hours. Fasting Lipid Panel: No results for input(s): CHOL, HDL, LDLCALC, TRIG, CHOLHDL, LDLDIRECT in the last 168 hours. Thyroid Function Tests: No results for input(s): TSH, T4TOTAL, FREET4, T3FREE, THYROIDAB in the last 168 hours. Coagulation: No results for input(s): LABPROT, INR in the last 168 hours. Anemia Panel: No results for input(s): VITAMINB12, FOLATE, FERRITIN, TIBC, IRON, RETICCTPCT in the last 168 hours. Urine Drug Screen: Drugs of Abuse     Component  Value Date/Time   LABOPIA NONE DETECTED 07/01/2013 1218   LABOPIA NEGATIVE 02/04/2009 2315   COCAINSCRNUR NONE DETECTED 07/01/2013 1218   COCAINSCRNUR NEGATIVE 02/04/2009 2315   LABBENZ NONE DETECTED 07/01/2013 1218   LABBENZ NEGATIVE 02/04/2009 2315   AMPHETMU NONE DETECTED 07/01/2013 1218   AMPHETMU NEGATIVE 02/04/2009 2315   THCU NONE DETECTED 07/01/2013 1218   LABBARB NONE DETECTED 07/01/2013 1218    Alcohol Level: No results for input(s): ETH in the last 168 hours. Urinalysis: No results for input(s): COLORURINE, LABSPEC, PHURINE, GLUCOSEU, HGBUR, BILIRUBINUR, KETONESUR, PROTEINUR, UROBILINOGEN, NITRITE, LEUKOCYTESUR in the last 168 hours.  Invalid input(s):  APPERANCEUR Misc. Labs:   Micro Results: Recent Results (from the past 240 hour(s))  Blood Culture (routine x 2)     Status: None (Preliminary result)   Collection Time: 03/15/15  5:50 PM  Result Value Ref Range Status   Specimen Description BLOOD LEFT ARM  Final   Special Requests BOTTLES DRAWN AEROBIC AND ANAEROBIC 5 CC  Final   Culture NO GROWTH < 24 HOURS  Final   Report Status PENDING  Incomplete  Blood Culture (routine x 2)     Status: None (Preliminary result)   Collection Time: 03/15/15  5:59 PM  Result Value Ref Range Status   Specimen Description BLOOD RIGHT ARM  Final   Special Requests BOTTLES DRAWN AEROBIC AND ANAEROBIC 4CC  Final   Culture NO GROWTH < 24 HOURS  Final   Report Status PENDING  Incomplete  MRSA PCR Screening     Status: None   Collection Time: 03/15/15  8:54 PM  Result Value Ref Range Status   MRSA by PCR NEGATIVE NEGATIVE Final    Comment:        The GeneXpert MRSA Assay (FDA approved for NASAL specimens only), is one component of a comprehensive MRSA colonization surveillance program. It is not intended to diagnose MRSA infection nor to guide or monitor treatment for MRSA infections.    Studies/Results: Dg Chest 2 View  03/15/2015  CLINICAL DATA:  59 year old female with shortness of breath and cough for 1 day. Hyper metabolic left lower lobe tumor on PET-CT in August. Subsequent encounter. EXAM: CHEST  2 VIEW COMPARISON:  03/03/2015 and earlier FINDINGS: Confluent left lung base opacity in the area of the hypermetabolic tumor, lateral basal segment left lower lobe. On CTA in November pneumonia surrounded the mass. No pleural effusion. Cardiomegaly and mediastinal lipomatosis. Right lung base appears stable. No pneumothorax or pulmonary edema. Osteopenia. No acute osseous abnormality identified. Extensive surgical clips in the epigastrium again noted. IMPRESSION: 1. Continued left lower lobe pneumonia since November, probably postobstructive in  relation to known left lower lobe hypermetabolic tumor. No pleural effusion. 2. No new No acute cardiopulmonary abnormality. Electronically Signed   By: Genevie Ann M.D.   On: 03/15/2015 17:22   Medications: I have reviewed the patient's current medications. Scheduled Meds: . aspirin  81 mg Oral QHS  . azithromycin  500 mg Oral Daily  . enoxaparin (LOVENOX) injection  40 mg Subcutaneous Q24H  . gabapentin  300 mg Oral QHS  . guaiFENesin  600 mg Oral BID  . ipratropium-albuterol  3 mL Nebulization TID  . mometasone-formoterol  2 puff Inhalation BID  . pantoprazole  40 mg Oral Daily  . pravastatin  40 mg Oral q1800  . predniSONE  40 mg Oral Q breakfast  . sodium chloride  3 mL Intravenous Q12H  . tiotropium  18 mcg Inhalation Daily   Continuous Infusions:  PRN Meds:.acetaminophen, acetaminophen, guaiFENesin-dextromethorphan, ipratropium-albuterol Assessment/Plan: Principal Problem:   COPD exacerbation ([place]) Active Problems:   Severe chronic obstructive pulmonary disease (HCC)   Chronic diastolic CHF (congestive heart failure) ([place])   HCAP (healthcare-associated pneumonia)  Ms. Yarde is a 59 yo woman with PMH of Stage 2 NSCLC (completed radiation therapy 11/23), COPD (4L [place] at home, FEV1 42%, FEV1/FVC 88% 12/02/14), pulmonary HTN, CAD s/p DES 2010, CHF (EF 50-55%), BPPV, and B12 deficiency.  Acute COPD Exacerbation, possibly exacerbated by viral or atypical PNA: Patient with acute onset SOB, likely worsened by longstanding end-stage COPD with/without radiation pneumonitis.  She has been afebrile without leukocytosis, but with increased cough and questionably increased sputum production.  CXR showing continuing left lower lobe opacity, which is continued tumor vs post-obstructive PNA. Despite increased BNP, she does not appear clinically hypervolemic and CXR showed no acute cardiopulmonary edema. PE should be considered, as she was acutely short of breath and tachycardic and she has active  cancer. Wells score 2.5. CTA done last month which was negative. She finished radiation 11/23.  Given her possibly increased cough/sputum, it is reasonable to cover for atypical PNA with Azithromycin.  Patient continues to desaturate when ambulating on home 4L.  Will continue steroids, antibiotics, and diurese patient and re-evaluate tomorrow. - Azithromycin 500 mg - Prednisone 40 mg daily - Albuterol, duonebs, dulera, spiriva  - Mucinex, and robitussin  - Daily weights - I/Os  HFpEF: Pt with rising BNP over a month. CXR showing no cardiopulmonary edema. Legs increasingly swollen today to 2+ edema but no crackles on exam.  CTM - Lasix 40 mg once, then Lasix 20 mg daily starting 12/14. - I/O - Daily weights  H/o Stage 2a NSCLC s/p radiation therapy completed 11/23: Followed by Dr. Pablo Ledger ([facility]).  GERD- Protonix HLD- pravastatin   Dispo: Disposition is deferred at this time, awaiting improvement of current medical problems.  Anticipated discharge in approximately 1-2 day(s).   The patient does have a current PCP Oval Linsey, MD) and does need an [facility] hospital follow-up appointment after discharge.  The patient does not have transportation limitations that hinder transportation to clinic appointments.  .Services Needed at time of discharge: Y = Yes, Blank = No PT:   OT:   RN:   Equipment:   Other:     LOS: 2 days   Iline Oven, MD 03/17/2015, 8:56 AM

## 2015-03-17 NOTE — Progress Notes (Signed)
Baseline oxygen saturation at 4 lpm/[place] was 90%,ambulated patient,we were in just passed by the bathroom ,patient felt very dizzy and her oxygen saturation fell at 82% @ 4 lpm/[place] Immediately placed back in bed for safety.

## 2015-03-17 NOTE — Clinical Documentation Improvement (Signed)
Internal Medicine  Can a diagnosis to support patient's BMI of 41.60 be provided.  Thank you   Morbid Obesity  (due to excessive calories or with alveolar hypoventilation or hypoventilation syndrome)  Obesity  Other  Clinically Undetermined  Document any associated diagnoses/conditions.   Supporting Information: Noted above    Please exercise your independent, professional judgment when responding. A specific answer is not anticipated or expected.   Thank You,  [place] [phone]

## 2015-03-18 ENCOUNTER — Other Ambulatory Visit: Payer: Self-pay | Admitting: Internal Medicine

## 2015-03-18 DIAGNOSIS — E669 Obesity, unspecified: Secondary | ICD-10-CM

## 2015-03-18 DIAGNOSIS — C3432 Malignant neoplasm of lower lobe, left bronchus or lung: Secondary | ICD-10-CM

## 2015-03-18 DIAGNOSIS — J441 Chronic obstructive pulmonary disease with (acute) exacerbation: Secondary | ICD-10-CM

## 2015-03-18 DIAGNOSIS — E119 Type 2 diabetes mellitus without complications: Secondary | ICD-10-CM

## 2015-03-18 DIAGNOSIS — Z9981 Dependence on supplemental oxygen: Secondary | ICD-10-CM

## 2015-03-18 DIAGNOSIS — I5032 Chronic diastolic (congestive) heart failure: Secondary | ICD-10-CM

## 2015-03-18 IMAGING — CR DG CHEST 2V
2 series · 2 of 2 positions shown · non-contrast
Comparison: Two-view chest 02/05/2012.

CLINICAL DATA: Shortness of breath.  Cough and congestion.

CHEST - 2 VIEW

[w chest pa]
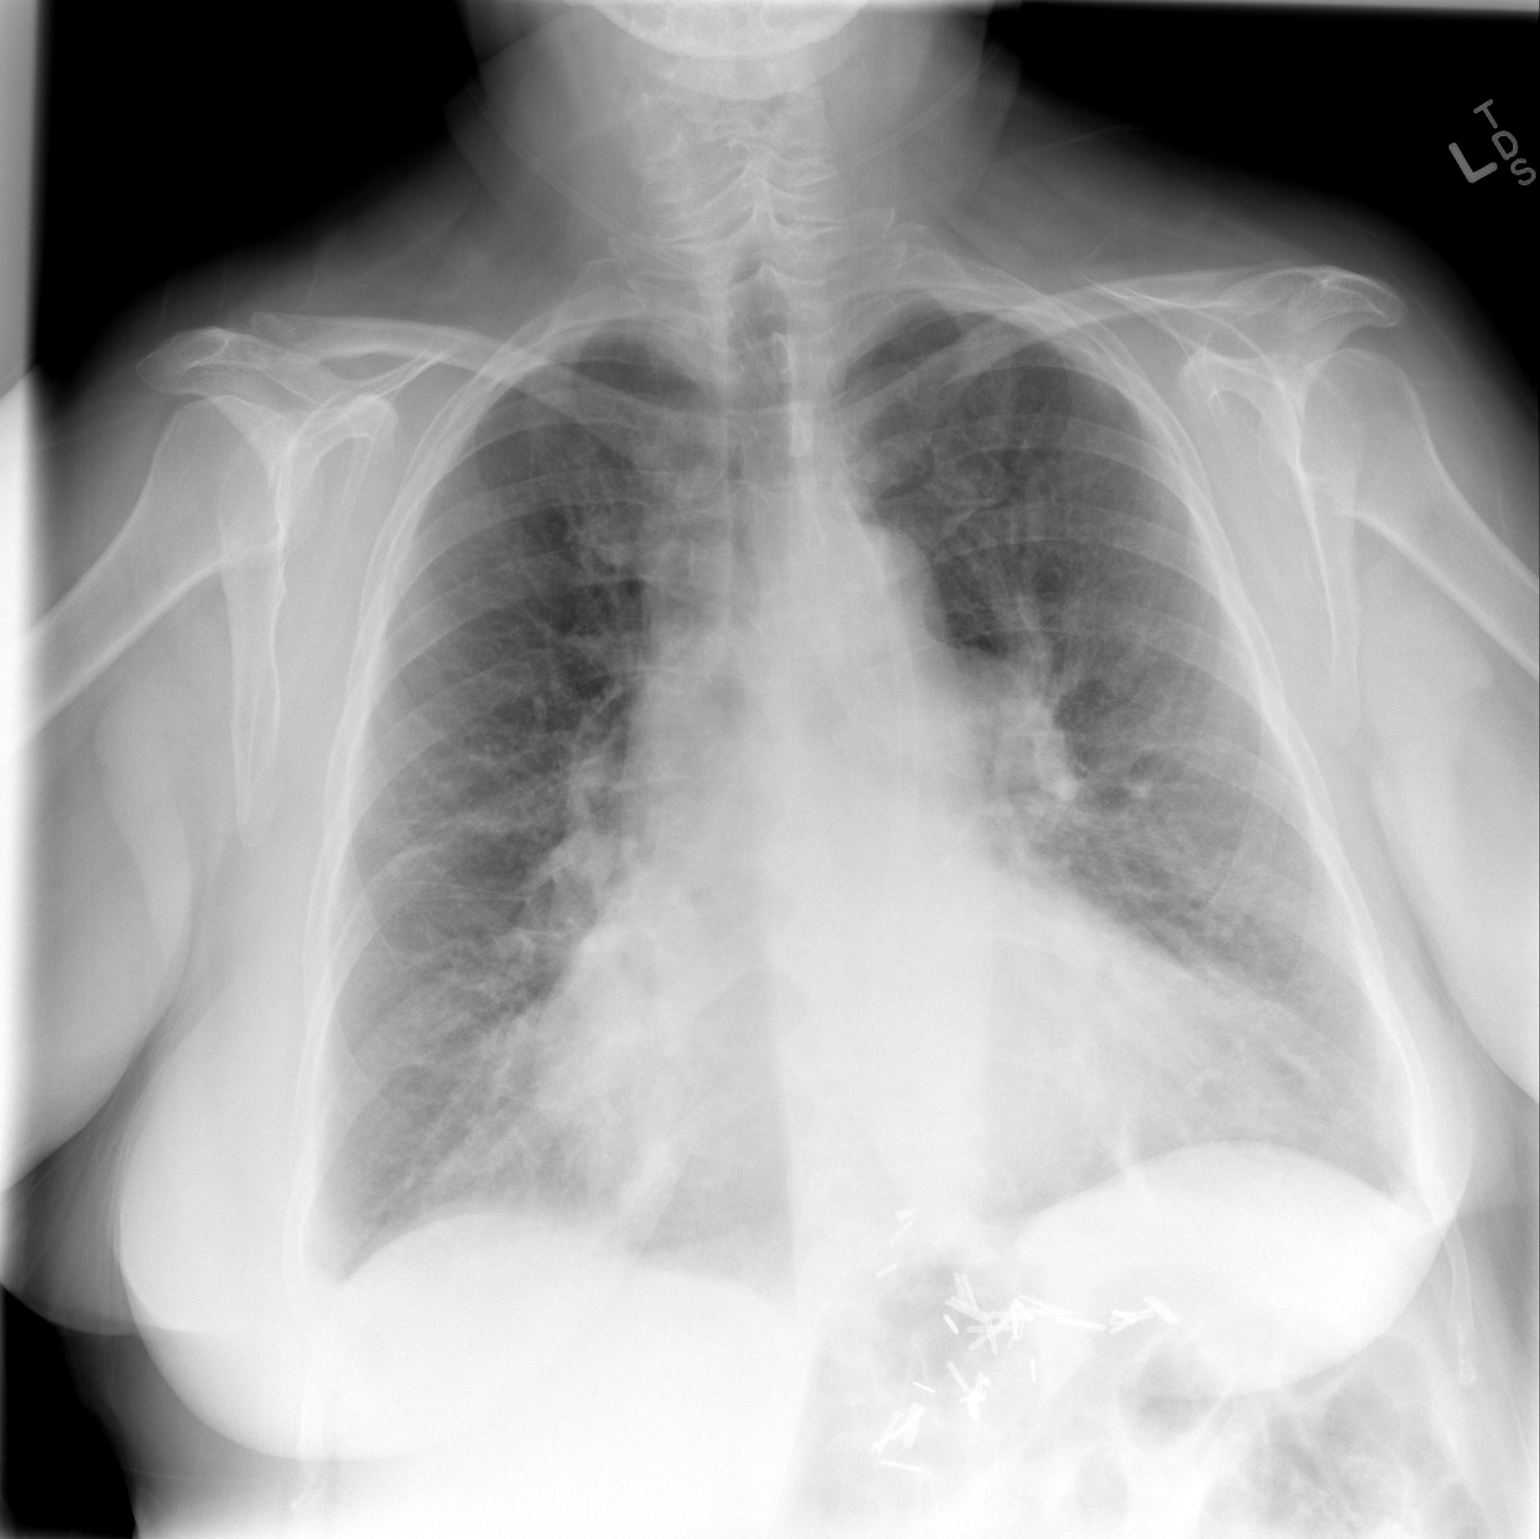

[w chest lat]
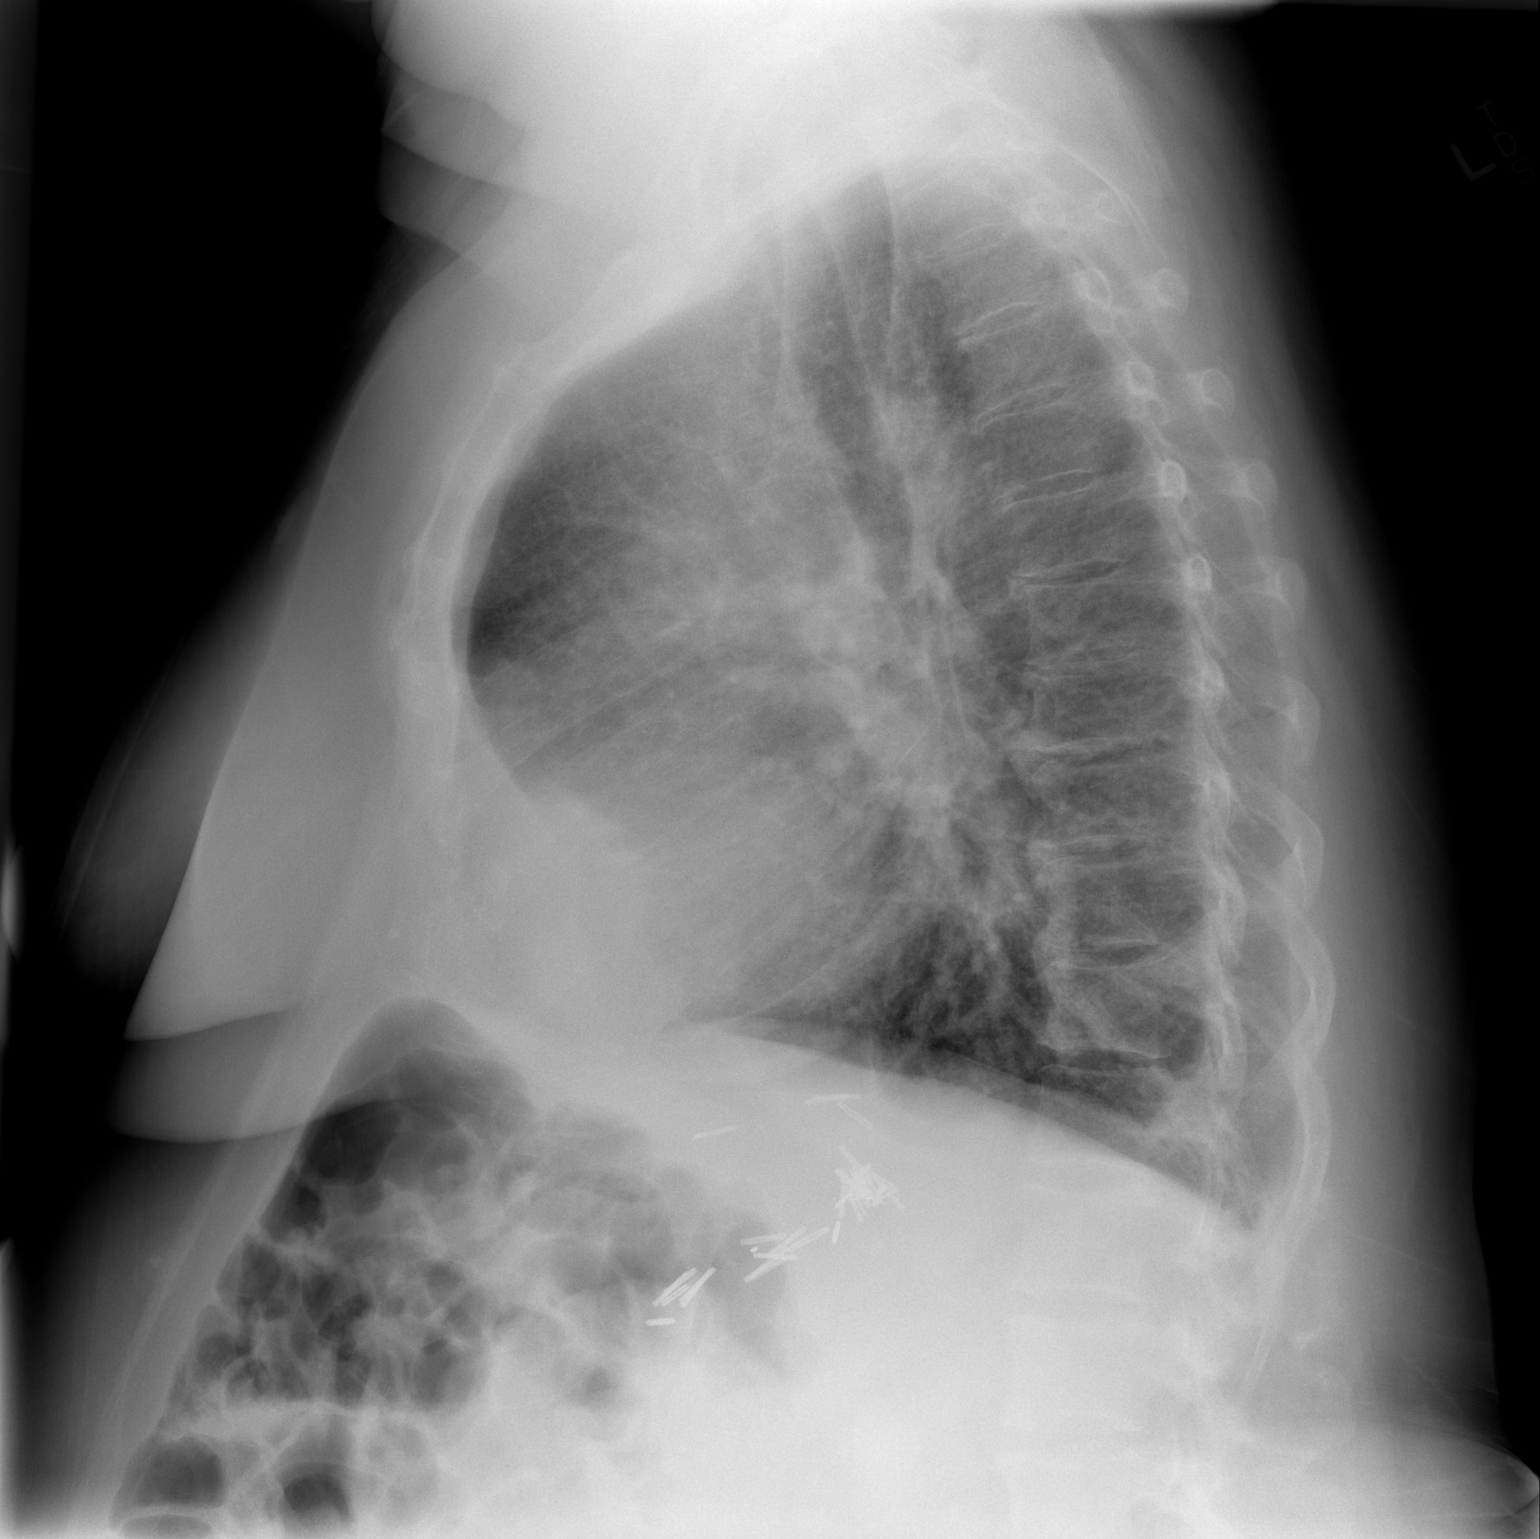

[2 of 2 positions shown; findings below may reference images not displayed]

FINDINGS: The heart is enlarged.  Pulmonary vascular congestion has
increased.  Bibasilar airspace disease is worse right than left.
Small bilateral pleural effusions are suspected.
IMPRESSION: 1.  Cardiomegaly and increased pulmonary vascular congestion is
worrisome for congestive heart failure.
2.  Bibasilar airspace disease, right greater than left.  Although
this may represent atelectasis, infection is not excluded.

## 2015-03-18 MED ORDER — DEXTROMETHORPHAN POLISTIREX ER 30 MG/5ML PO SUER
30.0000 mg | Freq: Two times a day (BID) | ORAL | Status: DC
  Administered 2015-03-18: 30 mg via ORAL
  Filled 2015-03-18 (×2): qty 5

## 2015-03-18 MED ORDER — DEXTROMETHORPHAN POLISTIREX ER 30 MG/5ML PO SUER: 30.0000 mg | Freq: Two times a day (BID) | ORAL | Status: DC

## 2015-03-18 MED ORDER — FUROSEMIDE 40 MG PO TABS: 40.0000 mg | ORAL_TABLET | Freq: Every day | ORAL | Status: DC

## 2015-03-18 MED ORDER — AZITHROMYCIN 250 MG PO TABS: 250.0000 mg | ORAL_TABLET | Freq: Every day | ORAL | Status: DC

## 2015-03-18 MED ORDER — PREDNISONE 20 MG PO TABS: 40.0000 mg | ORAL_TABLET | Freq: Every day | ORAL | Status: DC

## 2015-03-18 MED ORDER — AZITHROMYCIN 500 MG PO TABS
250.0000 mg | ORAL_TABLET | Freq: Every day | ORAL | Status: DC
  Administered 2015-03-18: 250 mg via ORAL
  Filled 2015-03-18: qty 1

## 2015-03-18 NOTE — Progress Notes (Signed)
Patient discharge papers printed,explained and given to the patient.Questions were answered satisfactorily by the time of discharged.No complaint,no skin issues and discharged on stable condition.Patient discharged with her own home oxygen with her.

## 2015-03-18 NOTE — Progress Notes (Signed)
Patient sitting at the bedside,baseline oxygen saturation was 90% @ 4 lpm/[place].Ambulated patient,patient barely out from her room ,oxygen saturation dropped to 84% @ 4 lpm/[place],patient verbalized "i need to turned back,i feel dizzy ". Returned to her room,sitting up at the bedside.Oxygen saturation went up to 91% _0  lpm/[place].Encouraged patient to sit up for a long time.

## 2015-03-18 NOTE — Progress Notes (Signed)
Patient refused bath.

## 2015-03-18 NOTE — Progress Notes (Signed)
Patient given rest for few minutes while sitting at the bedside,her oxygen saturation was 92% @ 5 lpm/[place],Ambulated toward the hallway,she ambulated the same distance on the hallway,about 14 feet,her oxygen saturation dropped down only at 88% @ 5 lpm/[place] and the patient did not complained of being dizzy.

## 2015-03-18 NOTE — Progress Notes (Signed)
Subjective: NAEON.  She feels about at her baseline, except for cough.  She normally takes Delsym at home for cough.  She ambulated this morning almost to the nursing station, at which point she became SOB.    She feels like she could manage herself at home.  Objective: Vital signs in last 24 hours: Filed Vitals:   03/17/15 2144 03/18/15 0302 03/18/15 0500 03/18/15 0553  BP:    100/49  Pulse:    73  Temp:    97.8 F (36.6 C)  TempSrc:    Oral  Resp:    22  Height:      Weight:   263 lb 3.7 oz (119.4 kg)   SpO2: 94% 91%  90%   Weight change: 3 lb 1.7 oz (1.41 kg)  Intake/Output Summary (Last 24 hours) at 03/18/15 1610 Last data filed at 03/17/15 2000  Gross per 24 hour  Intake    600 ml  Output    350 ml  Net    250 ml   Physical Exam  Constitutional: She is oriented to person, place, and time and well-developed, well-nourished, and in no distress. No distress.  Lying supine in bed. Easily arousable. NAD  HENT:  Head: Normocephalic and atraumatic.  Eyes: EOM are normal. No scleral icterus.  Neck: No JVD present. No tracheal deviation present.  Cardiovascular: Normal rate, regular rhythm and normal heart sounds.   Pulmonary/Chest: Breath sounds normal. No stridor. No respiratory distress.  Poor inspiratory effort.  Minimal scattered wheezes. Coarse inspiratory sounds without crackles.  Abdominal: Soft. She exhibits no distension. There is no tenderness. There is no rebound and no guarding.  Protuberant, but not distended or TTP  Musculoskeletal: Normal range of motion.  2+ edema to midshin.  Neurological: She is alert and oriented to person, place, and time.  Skin: Skin is warm and dry. No rash noted. She is not diaphoretic. No erythema.    Lab Results: Basic Metabolic Panel:  Recent Labs Lab 03/16/15 0740 03/17/15 0525  NA 139 137  K 5.1 4.2  CL 99* 94*  CO2 32 38*  GLUCOSE 186* 150*  BUN <5* <5*  CREATININE 0.73 0.78  CALCIUM 8.6* 8.4*   Liver Function  Tests: No results for input(s): AST, ALT, ALKPHOS, BILITOT, PROT, ALBUMIN in the last 168 hours. No results for input(s): LIPASE, AMYLASE in the last 168 hours. No results for input(s): AMMONIA in the last 168 hours. CBC:  Recent Labs Lab 03/15/15 1715 03/16/15 0740 03/17/15 0525  WBC 9.5 8.2 8.7  NEUTROABS 7.8*  --  7.6  HGB 11.7* 12.4 11.5*  HCT 40.5 43.4 39.6  MCV 105.7* 106.4* 105.9*  PLT 160 168 167   Cardiac Enzymes: No results for input(s): CKTOTAL, CKMB, CKMBINDEX, TROPONINI in the last 168 hours. BNP: No results for input(s): PROBNP in the last 168 hours. D-Dimer: No results for input(s): DDIMER in the last 168 hours. CBG: No results for input(s): GLUCAP in the last 168 hours. Hemoglobin A1C: No results for input(s): HGBA1C in the last 168 hours. Fasting Lipid Panel: No results for input(s): CHOL, HDL, LDLCALC, TRIG, CHOLHDL, LDLDIRECT in the last 168 hours. Thyroid Function Tests: No results for input(s): TSH, T4TOTAL, FREET4, T3FREE, THYROIDAB in the last 168 hours. Coagulation: No results for input(s): LABPROT, INR in the last 168 hours. Anemia Panel: No results for input(s): VITAMINB12, FOLATE, FERRITIN, TIBC, IRON, RETICCTPCT in the last 168 hours. Urine Drug Screen: Drugs of Abuse     Component  Value Date/Time   LABOPIA NONE DETECTED 07/01/2013 1218   LABOPIA NEGATIVE 02/04/2009 2315   COCAINSCRNUR NONE DETECTED 07/01/2013 1218   COCAINSCRNUR NEGATIVE 02/04/2009 2315   LABBENZ NONE DETECTED 07/01/2013 1218   LABBENZ NEGATIVE 02/04/2009 2315   AMPHETMU NONE DETECTED 07/01/2013 1218   AMPHETMU NEGATIVE 02/04/2009 2315   THCU NONE DETECTED 07/01/2013 1218   LABBARB NONE DETECTED 07/01/2013 1218    Alcohol Level: No results for input(s): ETH in the last 168 hours. Urinalysis: No results for input(s): COLORURINE, LABSPEC, PHURINE, GLUCOSEU, HGBUR, BILIRUBINUR, KETONESUR, PROTEINUR, UROBILINOGEN, NITRITE, LEUKOCYTESUR in the last 168 hours.  Invalid  input(s): APPERANCEUR Misc. Labs:   Micro Results: Recent Results (from the past 240 hour(s))  Blood Culture (routine x 2)     Status: None (Preliminary result)   Collection Time: 03/15/15  5:50 PM  Result Value Ref Range Status   Specimen Description BLOOD LEFT ARM  Final   Special Requests BOTTLES DRAWN AEROBIC AND ANAEROBIC 5 CC  Final   Culture NO GROWTH 2 DAYS  Final   Report Status PENDING  Incomplete  Blood Culture (routine x 2)     Status: None (Preliminary result)   Collection Time: 03/15/15  5:59 PM  Result Value Ref Range Status   Specimen Description BLOOD RIGHT ARM  Final   Special Requests BOTTLES DRAWN AEROBIC AND ANAEROBIC 4CC  Final   Culture NO GROWTH 2 DAYS  Final   Report Status PENDING  Incomplete  MRSA PCR Screening     Status: None   Collection Time: 03/15/15  8:54 PM  Result Value Ref Range Status   MRSA by PCR NEGATIVE NEGATIVE Final    Comment:        The GeneXpert MRSA Assay (FDA approved for NASAL specimens only), is one component of a comprehensive MRSA colonization surveillance program. It is not intended to diagnose MRSA infection nor to guide or monitor treatment for MRSA infections.    Studies/Results: No results found. Medications: I have reviewed the patient's current medications. Scheduled Meds: . aspirin  81 mg Oral QHS  . azithromycin  250 mg Oral Daily  . enoxaparin (LOVENOX) injection  40 mg Subcutaneous Q24H  . furosemide  20 mg Oral Daily  . gabapentin  300 mg Oral QHS  . guaiFENesin  600 mg Oral BID  . ipratropium-albuterol  3 mL Nebulization Q6H  . mometasone-formoterol  2 puff Inhalation BID  . pantoprazole  40 mg Oral Daily  . pravastatin  40 mg Oral q1800  . predniSONE  40 mg Oral Q breakfast  . sodium chloride  3 mL Intravenous Q12H  . tiotropium  18 mcg Inhalation Daily   Continuous Infusions:  PRN Meds:.acetaminophen, acetaminophen, guaiFENesin-dextromethorphan Assessment/Plan: Principal Problem:   COPD  exacerbation ([place]) Active Problems:   Severe chronic obstructive pulmonary disease (HCC)   Chronic diastolic CHF (congestive heart failure) ([place])   HCAP (healthcare-associated pneumonia)  [name] is a 59 yo woman with PMH of Stage 2 NSCLC (completed radiation therapy 11/23), COPD (4L [place] at home, FEV1 42%, FEV1/FVC 88% 12/02/14), pulmonary HTN, CAD s/p DES 2010, CHF (EF 50-55%), BPPV, and B12 deficiency.  Acute COPD Exacerbation, possibly exacerbated by viral or atypical PNA: Patient with acute onset SOB, likely worsened by longstanding end-stage COPD with/without radiation pneumonitis.  She has been afebrile without leukocytosis, but with increased cough and questionably increased sputum production.  CXR showing continuing left lower lobe opacity, which is continued tumor vs post-obstructive PNA. Despite increased BNP, she  does not appear clinically hypervolemic and CXR showed no acute cardiopulmonary edema. PE should be considered, as she was acutely short of breath and tachycardic and she has active cancer. Wells score 2.5. CTA done last month which was negative. She finished radiation 11/23.  Given her possibly increased cough/sputum, it is reasonable to cover for atypical PNA with Azithromycin.  Patient continues to desaturate when ambulating longer distances on home 4L.  Will continue steroids, antibiotics, and diurese patient and re-evaluate. If patient able to manage her normal ambulation distance, will consider discharge with close followup. - Azithromycin 250 mg - Prednisone 40 mg daily - Albuterol, duonebs, dulera, spiriva  - Mucinex, and robitussin  - Daily weights - I/Os - PT  HFpEF: Pt with rising BNP over a month. CXR showing no cardiopulmonary edema. Legs increasingly swollen to 2+ edema but no crackles on exam.  CTM - Lasix 40 mg daily - I/O - Daily weights  H/o Stage 2a NSCLC s/p radiation therapy completed 11/23: Followed by Dr. Pablo Ledger ([facility]).  GERD- Protonix HLD- pravastatin   Dispo: Disposition is deferred at this time, awaiting improvement of current medical problems.  Anticipated discharge in approximately 1-2 day(s).   The patient does have a current PCP Oval Linsey, MD) and does need an [facility] hospital follow-up appointment after discharge.  The patient does not have transportation limitations that hinder transportation to clinic appointments.  .Services Needed at time of discharge: Y = Yes, Blank = No PT:   OT:   RN:   Equipment:   Other:     LOS: 3 days   Iline Oven, MD 03/18/2015, 7:23 AM

## 2015-03-18 NOTE — Care Management Important Message (Signed)
Important Message  Patient Details  Name: [name] MRN: [id number] Date of Birth: 07/01/1955   Medicare Important Message Given:  Yes    [name] 03/18/2015, 10:25 AM

## 2015-03-18 NOTE — Progress Notes (Signed)
Refused bath this morning.

## 2015-03-18 NOTE — Progress Notes (Signed)
Nurse tech notified RN of leaking foley catheter. RN assessed foley and withdrew 6 mLs from foley balloon. RN reinserted 10 mls will continue to monitor patient.

## 2015-03-18 NOTE — Evaluation (Signed)
Physical Therapy Evaluation and Discharge Patient Details Name: [name] MRN: [id number] DOB: 1956-03-09 Today's Date: 03/18/2015   History of Present Illness  [name] is a 59 year old woman with stage II non-small cell lung cancer who recently completed a course of radiation therapy on November 23, oxygen requiring chronic obstructive pulmonary disease complicated by pulmonary hypertension, coronary artery disease, and vitamin B12 deficiency who presents with the acute onset of shortness of breath above her baseline associated with headaches and increased cough and sputum production.  Clinical Impression  Pt functioning at baseline. Pt stay at home and lives a sedentary lifestyle at baseline. Pt does not drive and rarely gets out of house. Pt functioning a mod I/ supervision. Pt on 4LO2 at home and con't to require O2 for mobility. Pt with no further acute PT needs at this time. PT SIGNING OFF. Please re-consult if needed in future.    Follow Up Recommendations No PT follow up;Supervision - Intermittent    Equipment Recommendations  None recommended by PT    Recommendations for Other Services       Precautions / Restrictions Precautions Precautions: Fall Restrictions Weight Bearing Restrictions: No      Mobility  Bed Mobility Overal bed mobility: Modified Independent             General bed mobility comments: used bedrail  Transfers Overall transfer level: Modified independent Equipment used: None Transfers: Sit to/from Stand Sit to Stand: Modified independent (Device/Increase time)         General transfer comment: pt pushed up from chair appropriately  Ambulation/Gait Ambulation/Gait assistance: Supervision Ambulation Distance (Feet): 60 Feet Assistive device: Rolling walker (2 wheeled) Gait Pattern/deviations: Step-through pattern;Decreased stride length;Wide base of support Gait velocity: decreased due to onset of SOB   General Gait Details: + SOB, pt  stopped half way to take standing rest break and reports "this is all i really walk"  Stairs            Wheelchair Mobility    Modified Rankin (Stroke Patients Only)       Balance Overall balance assessment: History of Falls;Needs assistance         Standing balance support: No upper extremity supported;During functional activity Standing balance-Leahy Scale: Fair Standing balance comment: pt okay during static standing but requires assist for dynamic activities                             Pertinent Vitals/Pain Pain Assessment: No/denies pain    Home Living Family/patient expects to be discharged to:: Private residence Living Arrangements:  (roomates (3 other people)) Available Help at Discharge: Family;Available 24 hours/day Type of Home: House Home Access: Stairs to enter Entrance Stairs-Rails: None Entrance Stairs-Number of Steps: 3 Home Layout: One level Home Equipment: Walker - 2 wheels;Cane - single point;Bedside commode;Shower seat      Prior Function Level of Independence: Independent with assistive device(s)         Comments: pt recently moved into new home. she has been using a walker for the last 2 weeks since she fell and hurt her ankle. otherwise she is able to dress, bath, and complete meal prep     Hand Dominance   Dominant Hand: Right    Extremity/Trunk Assessment   Upper Extremity Assessment: Overall WFL for tasks assessed           Lower Extremity Assessment: Overall WFL for tasks assessed (noted bilat LE edema)  Cervical / Trunk Assessment: Normal  Communication   Communication: No difficulties  Cognition Arousal/Alertness: Awake/alert Behavior During Therapy: WFL for tasks assessed/performed Overall Cognitive Status: Within Functional Limits for tasks assessed                      General Comments General comments (skin integrity, edema, etc.): pt of note self neglects herself, pt has refused a  bath since she's been here and has a sedentary lifestyle. she does not drive and depends on daughter to get her groceries and take her to appointments    Exercises        Assessment/Plan    PT Assessment Patent does not need any further PT services  PT Diagnosis Difficulty walking   PT Problem List    PT Treatment Interventions     PT Goals (Current goals can be found in the Care Plan section) Acute Rehab PT Goals Patient Stated Goal: to go home PT Goal Formulation: All assessment and education complete, DC therapy    Frequency     Barriers to discharge        Co-evaluation               End of Session Equipment Utilized During Treatment: Oxygen;Gait belt Activity Tolerance: Patient tolerated treatment well Patient left: in bed;with call bell/phone within reach Nurse Communication: Mobility status         Time: 6815-9470 PT Time Calculation (min) (ACUTE ONLY): 14 min   Charges:   PT Evaluation $Initial PT Evaluation Tier I: 1 Procedure     PT G CodesKingsley [name] 03/18/2015, 2:38 PM   [name], PT, DPT Pager #: [phone] Office #: [phone]

## 2015-03-18 NOTE — Progress Notes (Signed)
Pt. Still refusing to get a bath.

## 2015-03-18 NOTE — Discharge Summary (Signed)
Name: [name] MRN: [id number] DOB: 09-14-1955 59 y.o. PCP: Oval Linsey, MD  Date of Admission: 03/15/2015  4:00 PM Date of Discharge: 03/18/2015 Attending Physician: Annia Belt, MD  Discharge Diagnosis: 1. COPD Exacerbation   Principal Problem:   COPD exacerbation ([place]) Active Problems:   Severe chronic obstructive pulmonary disease (HCC)   Chronic diastolic CHF (congestive heart failure) ([place])   HCAP (healthcare-associated pneumonia)  Discharge Medications:   Medication List    TAKE these medications        acetaminophen 500 MG tablet  Commonly known as:  TYLENOL  Take 500 mg by mouth every 6 (six) hours as needed for moderate pain.     albuterol (2.5 MG/3ML) 0.083% nebulizer solution  Commonly known as:  PROVENTIL  Take 3 mLs (2.5 mg total) by nebulization every 6 (six) hours as needed for shortness of breath.     albuterol 108 (90 BASE) MCG/ACT inhaler  Commonly known as:  VENTOLIN HFA  Inhale 2 puffs into the lungs every 6 (six) hours as needed for shortness of breath.     aspirin 81 MG chewable tablet  Chew 1 tablet (81 mg total) by mouth at bedtime.     azithromycin 250 MG tablet  Commonly known as:  ZITHROMAX  Take 1 tablet (250 mg total) by mouth daily.     dextromethorphan 30 MG/5ML liquid  Commonly known as:  DELSYM  Take 5 mLs (30 mg total) by mouth 2 (two) times daily.     Fluticasone-Salmeterol 250-50 MCG/DOSE Aepb  Commonly known as:  ADVAIR DISKUS  Inhale 1 puff into the lungs 2 (two) times daily.     furosemide 40 MG tablet  Commonly known as:  LASIX  Take 1 tablet (40 mg total) by mouth daily.     gabapentin 300 MG capsule  Commonly known as:  NEURONTIN  Take 1 capsule (300 mg total) by mouth 3 (three) times daily.     lovastatin 40 MG tablet  Commonly known as:  MEVACOR  Take 1 tablet (40 mg total) by mouth at bedtime.     OXYGEN  Inhale 4 L into the lungs continuous.     pantoprazole 40 MG tablet  Commonly known  as:  PROTONIX  Take 1 tablet (40 mg total) by mouth daily.     potassium chloride SA 20 MEQ tablet  Commonly known as:  K-DUR,KLOR-CON  Take 2 tablets (40 mEq total) by mouth daily as needed (Take when taking lasix).     predniSONE 20 MG tablet  Commonly known as:  DELTASONE  Take 2 tablets (40 mg total) by mouth daily with breakfast.     tiotropium 18 MCG inhalation capsule  Commonly known as:  SPIRIVA HANDIHALER  Place 1 capsule (18 mcg total) into inhaler and inhale daily.        Disposition and follow-up:   [name] was discharged from [facility] in Stable condition.  At the hospital follow up visit please address:  1.  SOB, medication adherence, volume status, need for electric wheelchair  2.  Labs / imaging needed at time of follow-up: BMP (for K)  3.  Pending labs/ test needing follow-up: none  Follow-up Appointments: Follow-up Information    Follow up with Loleta Chance, MD On 03/25/2015.   Specialty:  Internal Medicine   Why:  10:45am   Contact information:   [place] [address] [phone]       Discharge  Instructions: Discharge Instructions    Call MD for:  difficulty breathing, headache or visual disturbances    Complete by:  As directed      Call MD for:  persistant dizziness or light-headedness    Complete by:  As directed      Diet - low sodium heart healthy    Complete by:  As directed      Increase activity slowly    Complete by:  As directed            Consultations:    Procedures Performed:  Dg Chest 2 View  03/15/2015  CLINICAL DATA:  59 year old female with shortness of breath and cough for 1 day. Hyper metabolic left lower lobe tumor on PET-CT in August. Subsequent encounter. EXAM: CHEST  2 VIEW COMPARISON:  03/03/2015 and earlier FINDINGS: Confluent left lung base opacity in the area of the hypermetabolic tumor, lateral basal segment left lower lobe. On CTA in November pneumonia surrounded the  mass. No pleural effusion. Cardiomegaly and mediastinal lipomatosis. Right lung base appears stable. No pneumothorax or pulmonary edema. Osteopenia. No acute osseous abnormality identified. Extensive surgical clips in the epigastrium again noted. IMPRESSION: 1. Continued left lower lobe pneumonia since November, probably postobstructive in relation to known left lower lobe hypermetabolic tumor. No pleural effusion. 2. No new No acute cardiopulmonary abnormality. Electronically Signed   By: Genevie Ann M.D.   On: 03/15/2015 17:22   Dg Chest 2 View  03/03/2015  CLINICAL DATA:  Shortness of breath for several days. Right-sided chest pain for 1 day. History of lung carcinoma on the left. EXAM: CHEST  2 VIEW COMPARISON:  Chest radiograph February 15, 2015 and chest CT February 15, 2015 FINDINGS: There is persistent left lower lobe airspace consolidation. There is new patchy consolidation in medial right base. Lungs elsewhere clear. Heart is mildly enlarged but stable. Pulmonary vascularity is within normal limits. No adenopathy apparent. No bone lesions. IMPRESSION: Persistent left lower lobe airspace consolidation consistent with pneumonia. New patchy airspace consolidation medial right base. Stable cardiac prominence. Electronically Signed   By: Lowella Grip III M.D.   On: 03/03/2015 15:26    2D Echo:   Cardiac Cath:   Admission HPI: This is a 59 yo woman with PMH of Stage 2 NSCLC (completed radiation therapy 11/23), COPD (4L [place] at home, FEV1 42%, FEV1/FVC 88% 12/02/14), pulmonary HTN, CAD s/p DES 2010, CHF (EF 50-55%), BPPV, and B12 deficiency  She says that she had shortness of breath and wheezing lasting 30 minutes this morning, which began while she was moving around in the house and then had to sit down. She says that she has been having dry non-productive cough for few days. She denies f/c/n/v, any sick contacts or URI symptoms. She denies lightheadedness or dizziness, and no LOC. She says that she  has stable 2 pillow orthopnea. She says that after she was discharged from the hospital last time, she felt better, and then seems to have gotten a little worse. She denies hematochezia or melena. No weight changes She is on 4L home oxygen and says she has not had increased oxygen requirements. She has not used her inhalers more frequently. She took all of her meds this morning.  She says she only takes lasix if she gets edema of the legs and she has not taken that in a while. She has noticed increased edema of her leg right being more than the left.   She was recently hospitalized several times 11/29  to 12/2 for COPD exacerbation And given steroids  11/13-11/16 for presumed post obstructive pneumonia and finished week of antibiotics. She finished her radiation treatment 11/23.  In the ER, her blood pressure dropped to 85/61 so she was given 1 L of bolus. On exam, she was a little tachycardic at 100, with BP running at 90/70 and was resting comfortably in bed.  Hospital Course by problem list: Principal Problem:   COPD exacerbation ([place]) Active Problems:   Severe chronic obstructive pulmonary disease (HCC)   Chronic diastolic CHF (congestive heart failure) (HCC)   HCAP (healthcare-associated pneumonia)   COPD Exacerbation: This admission, patient continued to have a LLL opacity, but again had no leukocytosis, fever, or purulent sputum.  However, her cough was increased and her symptoms may have been exacerbated by viral or atypical bacterial PNA.  Patient was given supportive care with scheduled Duonebs, as well as Prednisone 40 mg and Azithromycin.  Patient was discharged with total 5 day courses of Prednisone and Azithromycin.  On day of discharge, patient ambulated without symptoms on her home 4L [place] despite desaturation to 85%. Patient's SpO2 corrected to >88% on 5L.  Patient was discharged home on 4L [place], as she was asymptomatic.  At follow up, please address whether patient may benefit  from electric wheelchair given her quick desaturation with exertion.  HFpEF: Patient had increasing BNP over the last few admission.  CXR was negative for pulmonary edema and patient was not hypervolemic on exam.  During admission, her legs swelled to 2+ edema.  She was started on scheduled Lasix 40 mg PO daily.  At follow up, please check BMP to ensure she is not hypokalemic.  Discharge Vitals:   BP 112/61 mmHg  Pulse 87  Temp(Src) 97.9 F (36.6 C) (Oral)  Resp 22  Ht 5' 5" (1.651 m)  Wt 263 lb 3.7 oz (119.4 kg)  BMI 43.80 kg/m2  SpO2 92%  Discharge Labs:  No results found for this or any previous visit (from the past 24 hour(s)).  Signed: Iline Oven, MD 03/18/2015, 2:30 PM    Services Ordered on Discharge: none Equipment Ordered on Discharge: none

## 2015-03-18 NOTE — Progress Notes (Signed)
Sitting at the bedside ,her oxygen saturation was 90% @ 4 LPM/[name],anbulated toward the hallway,she walked the same distance ,oxygen saturation dropped down to 84% @ 4 lpm/[name] but the patient was not dizzy at this time.

## 2015-03-19 ENCOUNTER — Telehealth: Payer: Self-pay

## 2015-03-19 ENCOUNTER — Other Ambulatory Visit: Payer: Self-pay | Admitting: *Deleted

## 2015-03-19 NOTE — Telephone Encounter (Signed)
Transition Care Management Follow-up Telephone Call   Date discharged? 12/14  How have you been since you were released from the hospital? "okay" legs are "pretty swollen today" I instructed her to prop them up which she states she is going.  Pt reports shortness of breath but no more than baseline.    Do you understand why you were in the hospital? yes   Do you understand the discharge instructions? yes   Where were you discharged to? home   Items Reviewed:  Medications reviewed: yes  Allergies reviewed: yes  Dietary changes reviewed: yes  Referrals reviewed: yes   Functional Questionnaire:   Activities of Daily Living (ADLs):   She states they are independent in the following: ambulation, bathing and hygiene, feeding, continence, grooming, toileting and dressing States they require assistance with the following: na    Any transportation issues/concerns?: no   Any patient concerns? no   Confirmed importance and date/time of follow-up visits scheduled yes  Provider Appointment booked with Dr. Melburn Hake 12/21 at 1045  Confirmed with patient if condition begins to worsen call PCP or go to the ER.  Patient was given the office number and encouraged to call back with question or concerns.  : yes, pt to call Monday about earlier appointment if edema in legs does not start to decrease over next few days

## 2015-03-19 NOTE — Patient Outreach (Signed)
First attempt made to contact pt for transition of care (discharged 12/14).  HIPPA compliant voice message left with contact number.  If no response, will try again 12/16.     [name]   [facility]  [phone]

## 2015-03-20 ENCOUNTER — Other Ambulatory Visit: Payer: Self-pay | Admitting: *Deleted

## 2015-03-20 LAB — CULTURE, BLOOD (ROUTINE X 2)
CULTURE: NO GROWTH
CULTURE: NO GROWTH

## 2015-03-20 NOTE — Patient Outreach (Addendum)
Transition of care call (week 1- discharged 12/14).  Pt reports since discharge home,"Tired, can't get strength back".  Pt reports recent hospitalization- came in for  sob (moving to new home, cold weather).  Pt reports not doing nothing since discharge.  Pt reports she received a call yesterday about an electric wheelchair because of her sob walking but her house is too small, would be hard to use.  Pt states she uses a walker to get around  the house, has wheelchair if needed.  RN CM requested pt's current address to schedule home visit for week of 12/26 to which pt states it is either [address], [place], [place].  Discussed with pt  Coworker (covering for this RN CM) will be  f/u with her telephonically 12/22 as part of ongoing transition of care.     Zara Chess.   [facility]  [phone]

## 2015-03-23 ENCOUNTER — Telehealth: Payer: Self-pay | Admitting: *Deleted

## 2015-03-23 NOTE — Telephone Encounter (Signed)
We will be sure to encourage the consumption of more fresh fruit and vegetables and less processed foods with sodium in it when she is seen on 03/25/2015.  We will also make sure she is compliant with her oxygen therapy.  Some of the water retention is to be expected after discharge as she was placed on a short steroid burst for her recent COPD exacerbation.  This should improve after completion of the prednisone course.

## 2015-03-23 NOTE — Telephone Encounter (Signed)
[facility] has called pt: Pt stated this weekend she had increased swelling of legs and feet Just moved didn't have stove hooked up, been eating a lot of frozen foods and convience foods She stated she increased her use of inhalers and oxygen up to 5 liters back down to 4 liters this am She is continuing using prepared frozen foods She has appt 12/21 at 1045 dr Melburn Hake The ph call was disconnected while they spoke and nurse did not get to ask pt if she is taking her lasix as ordered at [facility], she has attempted to call back but cannot get pt

## 2015-03-25 ENCOUNTER — Ambulatory Visit (INDEPENDENT_AMBULATORY_CARE_PROVIDER_SITE_OTHER): Payer: Commercial Managed Care - HMO | Admitting: Internal Medicine

## 2015-03-25 ENCOUNTER — Encounter: Payer: Self-pay | Admitting: Internal Medicine

## 2015-03-25 ENCOUNTER — Encounter: Payer: Self-pay | Admitting: Licensed Clinical Social Worker

## 2015-03-25 VITALS — BP 116/63 | HR 94 | Temp 98.5°F | Ht 64.0 in | Wt 242.9 lb

## 2015-03-25 DIAGNOSIS — H449 Unspecified disorder of globe: Secondary | ICD-10-CM

## 2015-03-25 DIAGNOSIS — F17210 Nicotine dependence, cigarettes, uncomplicated: Secondary | ICD-10-CM | POA: Diagnosis not present

## 2015-03-25 DIAGNOSIS — I5032 Chronic diastolic (congestive) heart failure: Secondary | ICD-10-CM | POA: Diagnosis not present

## 2015-03-25 DIAGNOSIS — Z720 Tobacco use: Secondary | ICD-10-CM

## 2015-03-25 DIAGNOSIS — D649 Anemia, unspecified: Secondary | ICD-10-CM | POA: Insufficient documentation

## 2015-03-25 DIAGNOSIS — J449 Chronic obstructive pulmonary disease, unspecified: Secondary | ICD-10-CM

## 2015-03-25 DIAGNOSIS — Z9981 Dependence on supplemental oxygen: Secondary | ICD-10-CM | POA: Diagnosis not present

## 2015-03-25 LAB — GLUCOSE, CAPILLARY: Glucose-Capillary: 179 mg/dL — ABNORMAL HIGH (ref 65–99)

## 2015-03-25 MED ORDER — SULFAMETHOXAZOLE-TRIMETHOPRIM 400-80 MG PO TABS: 1.0000 | ORAL_TABLET | Freq: Every day | ORAL | Status: AC

## 2015-03-25 MED ORDER — NICOTINE 14 MG/24HR TD PT24: 14.0000 mg | MEDICATED_PATCH | TRANSDERMAL | Status: DC

## 2015-03-25 MED ORDER — PREDNISONE 20 MG PO TABS: 40.0000 mg | ORAL_TABLET | Freq: Every day | ORAL | Status: AC

## 2015-03-25 MED ORDER — NICOTINE POLACRILEX 4 MG MT GUM
4.0000 mg | CHEWING_GUM | OROMUCOSAL | Status: DC | PRN
Start: 2015-03-25 — End: 2015-04-13

## 2015-03-25 NOTE — Assessment & Plan Note (Signed)
She is still smoking 2 cigarettes per day; she direly wants to quit, but tells me that smoking helps her shortness of breath by clearing out her lungs. I prescribed her a nicotine patch and gum today, and reiterated the crucial importance of quitting.

## 2015-03-25 NOTE — Assessment & Plan Note (Signed)
I checked a ferritin today; if this is low, we can consider starting iron to boost her oxygen carrying capacity and perhaps help with her dyspnea.

## 2015-03-25 NOTE — Patient Instructions (Addendum)
Miss Daughenbaugh,  It was a pleasure meeting you today.  I think you're still in a COPD exacerbation today. Please increase your oxygen to 5 L. We will give you another 5 day course of prednisone, and a 5 day course of an antibiotic called Bactrim. I'm hoping this will help get you to the holidays.  Quitting smoking is hands down the best thing you can do for your health. I've also prescribed you a nicotine patch and gum. Only take the gum when you feel the urge to smoke.  The swelling in your legs should get better in treating your COPD. Continue taking the Lasix 40 mg daily. I've also prescribed you compression stockings.  Sometime in January, we can talk about getting you pulmonary rehabilitation, to teach you some techniques to breathe better and live with your condition.  Take care, and have a Merry Christmas, Dr. Melburn Hake

## 2015-03-25 NOTE — Progress Notes (Signed)
I saw and evaluated the patient. I personally confirmed the key portions of Dr. Melburn Hake' history and exam and reviewed pertinent patient test results. The assessment, diagnosis, and plan were formulated together and I agree with the documentation in the resident's note.  I agree she clinically has an acute COPD exacerbation again given her symptomatology and this will be treated with antibiotics, another steroid burst, and an increase in her O2.

## 2015-03-25 NOTE — Progress Notes (Signed)
Patient ID: [name], female   DOB: 01/15/1956, 59 y.o.   MRN: [id number] [facility] Subjective:   Patient ID: [name] female   DOB: 15-May-1955 59 y.o.   MRN: [id number]  HPI: [name] is a 59 y.o. 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  She was discharged on December 14 after a COPD exacerbation that responded well to oral prednisone, azithromycin, and scheduled bronchodilators.  Since that time, she tells me she has been coughing up more, thicker sputum. She is still on 4 L oxygen at home, and her dyspnea on exertion is stable. She is still smoking 2 cigarettes per day. She has never tried the nicotine patch or gum. Also, she has never gone to pulmonary rehabilitation.  She complains of worsening swelling in her bilateral lower legs. She is only able to wear sandals, and her feet are constantly cold. She is taking Lasix 40 mg daily. She's never tried compression socks.  Please see the assessment and plan for the status of the patient's chronic medical problems.  Past Medical History  Diagnosis Date  . Coronary artery disease     S/P PCI of LAD with DES (12/2008). Total occlusion of RCA noted at that time., medically managed. ACS ruled out 03/2009 with Lexiscan myoview . Followed by [facility].  . Pulmonary hypertension ([facility])     2-D Echo (44/0347) - Systolic pressure was moderately increased. PA peak pressure  70mHg. 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 Followed by Dr. WJoya Gaskins([facility]  . Diastolic dysfunction     2-D Echo (12/2008) - Normal LV Systolic funciton with EF 60-65%. Grade 1 diastolid dysfunction. No regional wall motion abnormalities. Moderate pulmonary HTN  with PA peak pressure 564mg.  . Marland KitchenOPD (chronic obstructive pulmonary disease) (HCC)     Severe. Gold Stage IV.  PFTs (12/2008) - severe obstructive airway disease. Active tobacco use. Requires 4L O2 at home.  . Pulmonary nodule, right     Small right middle lobe nodule. Stable as of 12/2008.  . Marland Kitchenrediabetes     HgbA1c 6.4 (12/2008)  . Hx MRSA infection     Recurrent MRSA thigh abscesses.  . Obesity   . Hyperlipidemia   . GERD (gastroesophageal reflux disease)     S/P Nissen fundoplication.  . CHF (congestive heart failure) ([facility]  . On home oxygen therapy since 2010    4L all the time  . Shortness of breath dyspnea     with a lot of exertion; if fluid builds up  . Pneumonia   . Depression   . Full dentures   . Chronic bronchitis ([facility]    "get it most years" (03/03/2015)  . Lung cancer ([facility]    "left"  . History of hiatal hernia   . Arthritis     "some scattered" (03/03/2015)   Current Outpatient Prescriptions  Medication Sig Dispense Refill  . acetaminophen (TYLENOL) 500 MG tablet Take 500 mg by mouth every 6 (six) hours as needed for moderate pain.     . Marland Kitchenlbuterol (PROVENTIL) (2.5 MG/3ML) 0.083% nebulizer solution Take 3 mLs (2.5 mg total) by nebulization every 6 (six) hours as needed for shortness of breath. 360 mL 3  .  albuterol (VENTOLIN HFA) 108 (90 BASE) MCG/ACT inhaler Inhale 2 puffs into the lungs every 6 (six) hours as needed for shortness of breath. 18 g 11  . aspirin 81 MG chewable tablet Chew 1 tablet (81 mg total) by mouth at bedtime. 90 tablet 3  . azithromycin (ZITHROMAX) 250 MG tablet Take 1 tablet (250 mg total) by mouth daily. 2 tablet 0  . dextromethorphan (DELSYM) 30 MG/5ML liquid Take 5 mLs (30 mg total) by mouth 2 (two) times daily. (Patient not taking: Reported on 03/20/2015) 89 mL 0  . Fluticasone-Salmeterol (ADVAIR DISKUS) 250-50 MCG/DOSE AEPB Inhale 1 puff into the lungs 2 (two) times daily. 60 each 5  . furosemide (LASIX) 40 MG tablet Take 1 tablet  (40 mg total) by mouth daily. 90 tablet 3  . gabapentin (NEURONTIN) 300 MG capsule Take 1 capsule (300 mg total) by mouth 3 (three) times daily. (Patient taking differently: Take 300 mg by mouth at bedtime as needed (for nerve pain). ) 90 capsule 11  . lovastatin (MEVACOR) 40 MG tablet Take 1 tablet (40 mg total) by mouth at bedtime. 30 tablet 5  . OXYGEN Inhale 4 L into the lungs continuous.    . pantoprazole (PROTONIX) 40 MG tablet Take 1 tablet (40 mg total) by mouth daily. 30 tablet 3  . potassium chloride SA (K-DUR,KLOR-CON) 20 MEQ tablet Take 2 tablets (40 mEq total) by mouth daily as needed (Take when taking lasix). (Patient taking differently: Take 20 mEq by mouth daily as needed (Take when taking lasix). ) 180 tablet 3  . predniSONE (DELTASONE) 20 MG tablet Take 2 tablets (40 mg total) by mouth daily with breakfast. 4 tablet 0  . tiotropium (SPIRIVA HANDIHALER) 18 MCG inhalation capsule Place 1 capsule (18 mcg total) into inhaler and inhale daily. 30 capsule 12   No current facility-administered medications for this visit.   Family History  Problem Relation Age of Onset  . Heart disease Mother 20    Deceased from MI at 41yo  . Hypertension Mother   . Heart disease Father 22    Deceased of MI age 81yo  . Hypertension Father   . Hypertension Brother   . Lung cancer      Grandmother   Social History   Social History  . Marital Status: Divorced    Spouse Name: N/A  . Number of Children: N/A  . Years of Education: N/A   Social History Main Topics  . Smoking status: Current Every Day Smoker -- 2.00 packs/day for 45 years    Types: Cigarettes    Start date: 04/04/1969  . Smokeless tobacco: Never Used     Comment: 2 per day  . Alcohol Use: No  . Drug Use: No  . Sexual Activity: Not Currently    Birth Control/ Protection: None   Other Topics Concern  . None   Social History Narrative   Formerly worked as a Scientist, water quality, now disabled.   Divorced.   2 grown children.   Lives  with her grandson.   Review of Systems  Constitutional: Positive for malaise/fatigue. Negative for fever and chills.  Eyes: Negative for blurred vision and double vision.  Respiratory: Positive for cough, sputum production, shortness of breath and wheezing. Negative for hemoptysis.   Cardiovascular: Positive for orthopnea and leg swelling. Negative for chest pain.  Gastrointestinal: Negative for heartburn and abdominal pain.  Genitourinary: Negative for dysuria and urgency.  Musculoskeletal: Negative for myalgias.  Neurological: Positive for headaches. Negative for dizziness and  loss of consciousness.  Psychiatric/Behavioral: Negative for depression.    Objective:  Physical Exam: Filed Vitals:   03/25/15 1025  BP: 116/63  Pulse: 94  Temp: 98.5 F (36.9 C)  TempSrc: Oral  Height: _0  (1.626 m)  Weight: 242 lb 14.4 oz (110.179 kg)  SpO2: 86%   General: overweight white lady on 4L [place], resting in chair comfortably HEENT: no scleral icterus, extra-ocular muscles intact, oropharynx without lesions Cardiac: regular rate and rhythm, no rubs, murmurs or gallops Pulm: breathing well on 4L [place], some end expiratory ronchi loudest in left lower lobe, no bibasilar crackles, and otherwise clear Abd: bowel sounds normal, soft, nondistended, non-tender Ext: warm and well perfused, 2+ pitting edema to knees Lymph: no cervical or supraclavicular lymphadenopathy Skin: no rash, hair, or nail changes Neuro: alert and oriented X3, cranial nerves II-XII grossly intact, moving all extremities well   Assessment & Plan:  Case discussed with Dr. Eppie Gibson  Severe chronic obstructive pulmonary disease ([facility]) She has severe COPD at baseline, and appears to be in yet another early exacerbation given her increased sputum volume and purulence, and oxygen saturation of 84% on 4 L. She is compliant with her medications. She denies any fever suggestive of postobstructive pneumonia, as she recently completed a  course of radiation for left lower lobe adenocarcinoma.  I recommended she increase her oxygen to 5 L, with a goal saturation between 88-92%, to improve her dyspnea, and help with her lower extremity edema from presumptive cor pulmonale. I also treated her with a five-day course of prednisone and Bactrim for her acute COPD exacerbation. I considered doxycycline, but she tells me she only has 30 cents to her name, and can't afford this medication. She just completed a course of azithromycin during her most recent hospitalization. I also prescribed her a nicotine patch and gum, she is still smoking 2 cigarettes per day, but would like to quit.  Going forward, her prognosis is tenuous given the severity of her COPD, recent radiation for her lung cancer, and frequent admissions. Our overall goal is to keep her out of the hospital and improve her quality of life. I've written for a electric wheelchair to help her get around. She is interested in pulmonary rehabilitation as well but did not want a referral until after the holidays. She would like to revisit this idea at her next appointment in January.  Tobacco abuse She is still smoking 2 cigarettes per day; she direly wants to quit, but tells me that smoking helps her shortness of breath by clearing out her lungs. I prescribed her a nicotine patch and gum today, and reiterated the crucial importance of quitting.  Normocytic anemia I checked a ferritin today; if this is low, we can consider starting iron to boost her oxygen carrying capacity and perhaps help with her dyspnea.  Chronic diastolic CHF (congestive heart failure) (HCC) She complains of lower extremity edema which I believe is from cor pulmonale from her severe COPD; thus, we need to treat her underlying lung disease the best we can to negate the hypoxic vasoconstriction and ease the back pressure on the right ventricle. I recommended she increase her oxygen to 5 L with a goal of 88-92%  saturation. I'm also treating her for an acute COPD exacerbation per my prior assessment note. We'll continue the Lasix 72m daily for now. I've also written her for compression stockings, as I believe she has some element of chronic venous insufficiency as well.    Medications  Ordered Meds ordered this encounter  Medications  . nicotine (NICODERM CQ - DOSED IN MG/24 HOURS) 14 mg/24hr patch    Sig: Place 1 patch (14 mg total) onto the skin daily.    Dispense:  90 patch    Refill:  3  . nicotine polacrilex (NICORETTE) 4 MG gum    Sig: Take 1 each (4 mg total) by mouth as needed for smoking cessation.    Dispense:  100 tablet    Refill:  0  . sulfamethoxazole-trimethoprim (BACTRIM) 400-80 MG tablet    Sig: Take 1 tablet by mouth daily.    Dispense:  5 tablet    Refill:  0  . predniSONE (DELTASONE) 20 MG tablet    Sig: Take 2 tablets (40 mg total) by mouth daily with breakfast.    Dispense:  10 tablet    Refill:  0   Other Orders Orders Placed This Encounter  Procedures  . DME Wheelchair electric  . Compression stockings  . Glucose, capillary  . Ferritin  . BMP8+Anion Gap  . Ambulatory referral to Social Work    Referral Priority:  Routine    Referral Type:  Consultation    Referral Reason:  Specialty Services Required    Number of Visits Requested:  1   Follow Up: Return in about 4 weeks (around 04/22/2015) for follow up.

## 2015-03-25 NOTE — Assessment & Plan Note (Addendum)
She complains of lower extremity edema which I believe is from cor pulmonale from her severe COPD; thus, we need to treat her underlying lung disease the best we can to negate the hypoxic vasoconstriction and ease the back pressure on the right ventricle. I recommended she increase her oxygen to 5 L with a goal of 88-92% saturation. I'm also treating her for an acute COPD exacerbation per my prior assessment note. We'll continue the Lasix 71m daily for now. I've also written her for compression stockings, as I believe she has some element of chronic venous insufficiency as well.

## 2015-03-25 NOTE — Assessment & Plan Note (Signed)
She has severe COPD at baseline, and appears to be in yet another early exacerbation given her increased sputum volume and purulence, and oxygen saturation of 84% on 4 L. She is compliant with her medications. She denies any fever suggestive of postobstructive pneumonia, as she recently completed a course of radiation for left lower lobe adenocarcinoma.  I recommended she increase her oxygen to 5 L, with a goal saturation between 88-92%, to improve her dyspnea, and help with her lower extremity edema from presumptive cor pulmonale. I also treated her with a five-day course of prednisone and Bactrim for her acute COPD exacerbation. I considered doxycycline, but she tells me she only has 30 cents to her name, and can't afford this medication. She just completed a course of azithromycin during her most recent hospitalization. I also prescribed her a nicotine patch and gum, she is still smoking 2 cigarettes per day, but would like to quit.  Going forward, her prognosis is tenuous given the severity of her COPD, recent radiation for her lung cancer, and frequent admissions. Our overall goal is to keep her out of the hospital and improve her quality of life. I've written for a electric wheelchair to help her get around. She is interested in pulmonary rehabilitation as well but did not want a referral until after the holidays. She would like to revisit this idea at her next appointment in January.

## 2015-03-25 NOTE — Progress Notes (Signed)
[name] was referred to CSW as pt voiced difficulty affording her prescriptions at this time.  CSW met with pt following her scheduled [facility] appointment.  [name] states she recently moved and can not afford her $3 Rx copayment at this time.  Pt is insured and receives ExtraHelp for prescription coverage.  Pt is linked with [facility] for TOC.  CSW placed call to pt's pharmacy, total cost for sulfamethoxazole-trimethoprim (BACTRIM) 400-80 MG tablet and predniSONE (DELTASONE) 20 MG tablet prescription is $3.40.  Case discussed with physician and financial counselor.  [facility] assistance available for the above medications.  CSW spoke with [name] and informed pt [facility] would be able to assist with the above medications but unable to assist the NRT, compression stockings or copayment of electric wheelchair.  [name] provided with financial assistance.  CSW will sign off.

## 2015-03-26 ENCOUNTER — Encounter: Payer: Self-pay | Admitting: Internal Medicine

## 2015-03-26 ENCOUNTER — Ambulatory Visit: Payer: Commercial Managed Care - HMO

## 2015-03-26 LAB — BMP8+ANION GAP
ANION GAP: 14 mmol/L (ref 10.0–18.0)
BUN / CREAT RATIO: 6 — AB (ref 11–26)
BUN: 5 mg/dL — AB (ref 8–27)
CALCIUM: 9.5 mg/dL (ref 8.7–10.3)
CHLORIDE: 86 mmol/L — AB (ref 96–106)
CO2: 40 mmol/L — AB (ref 18–29)
CREATININE: 0.78 mg/dL (ref 0.57–1.00)
GFR calc Af Amer: 96 mL/min/{1.73_m2} (ref >59)
GFR calc non Af Amer: 83 mL/min/{1.73_m2} (ref >59)
GLUCOSE: 154 mg/dL — AB (ref 65–99)
Potassium: 3.1 mmol/L — ABNORMAL LOW (ref 3.5–5.2)
Sodium: 140 mmol/L (ref 134–144)

## 2015-03-26 LAB — FERRITIN: Ferritin: 132 ng/mL (ref 15–150)

## 2015-03-27 ENCOUNTER — Other Ambulatory Visit: Payer: Self-pay

## 2015-03-27 DIAGNOSIS — F17200 Nicotine dependence, unspecified, uncomplicated: Secondary | ICD-10-CM

## 2015-03-27 NOTE — Patient Outreach (Signed)
This RNCM was successful in making contact with patient via telephone. patient identified herself by proving her date of birth and address.    Patient was very receptive to call and readily participated in this assessment of community case management needs. Patient admitted to being an everyday smoker.  States further she cannot afford the nicorette tablets, would like to quit smoking.  This RNCM advised patient on a benefit of [facility] Program is to assist with smoking cessation.  Patient agreed to referral, referral placed.     Patient and this RNCM reviewed and updated her case management care plan Patient advised her primary care manager will be back next week and I will update her case manager on this assessment.    Plan; Update patient's primary care coordinator upon her return next week

## 2015-03-31 ENCOUNTER — Telehealth: Payer: Self-pay | Admitting: Dietician

## 2015-03-31 NOTE — Telephone Encounter (Signed)
Called to find out when her diabetes eye exam was scheduled. She says she is scheduled for January 10th, 2017. CDE will forward this information to medical records to retrieve the report after her visit occurs.

## 2015-04-02 ENCOUNTER — Other Ambulatory Visit: Payer: Self-pay | Admitting: *Deleted

## 2015-04-02 ENCOUNTER — Telehealth: Payer: Self-pay | Admitting: *Deleted

## 2015-04-02 NOTE — Telephone Encounter (Signed)
[facility] calls and states pt does not open up and will not be be specific about her problems, she calls today and states in ph call pt related these concerns but would not elaborate: 1) is not taking lasix daily, seems only sporadically 2) cannot pay for some of her meds 3) continues to eat frozen, prepackagized foods 4) has increased smoking 5) has constant edema in feet and legs 6) becoming more short of breath 7) living in the living room of a very small house with appr 4 more adults and several children from time to time Will call and make appt for pt, will speak with shanag. And send this note to [facility] , pcp and csw  04/08/2015 1123 Spoke w/ csw, shana, then called pt, made appt for 1/12 dr Marvel Plan. Pt states she has misplaced her meds but does not need help w/ meds at this time. States she has not gotten her compression hose as of yet, states edema is no better nor no worse. States she is working on housing and it is not [employer] unless she ask for help. Will f/u as pt request

## 2015-04-02 NOTE — Telephone Encounter (Signed)
Thank you for the information.  Agree with scheduling clinic appointment for reassessment.

## 2015-04-02 NOTE — Patient Outreach (Signed)
Transition of care call (week 3, discharged 12/14):  Pt reports finished Prednisone and Bactrim, feels better.  Pt reports she quit taking her Lasix for a few days because it was not working but went back taking it today, only went to the bathroom once.   Pt reports she has a lot swelling, there all the time, worse today.  Pt reports she did not get prescription filled for nicotine patch, insurance does not cover, cannot afford.  RN CM informed pt that [facility] program no longer assists patients with smoking cessation but  [facility] provides nicotine patches for free as well as counseling services.   Also, discussed doing a home visit (part of transition of care) to which pt agreed, new address provided - [address]  [place], [place] [address].    Plan to f/u again 1/6 (home visit) as part of ongoing transition of care.    Addendum: As requested, called pt back to provide contact number for [facility].     Zara Chess.   [facility]  [phone]

## 2015-04-07 NOTE — Telephone Encounter (Signed)
Thank you.  I met with Ms. Steinmeyer on 03/25/15.  During this encounter patient stated she could not afford her medications. Pt states she has multiple bills to cover and at this time has zero funds available for medications.  [facility] provided funds to cover 2 medications prescribed during the office visit. See CSW encounter on 03/25/15 for additional information. Pt states she is over-income for full medicaid benefits but receives ExtraHelp assistance.  Copayment for medications no more than $3 per Rx.  [facility] care manager notified during this encounter.  This worker unaware of additional resources available.

## 2015-04-08 ENCOUNTER — Other Ambulatory Visit: Payer: Self-pay | Admitting: Internal Medicine

## 2015-04-08 ENCOUNTER — Encounter: Payer: Self-pay | Admitting: Licensed Clinical Social Worker

## 2015-04-08 DIAGNOSIS — E785 Hyperlipidemia, unspecified: Secondary | ICD-10-CM

## 2015-04-09 ENCOUNTER — Ambulatory Visit
Admission: RE | Admit: 2015-04-09 | Discharge: 2015-04-09 | Disposition: A | Discharge status: Home / Self Care | Payer: Commercial Managed Care - HMO | Source: Ambulatory Visit | Attending: Radiation Oncology | Admitting: Radiation Oncology

## 2015-04-09 ENCOUNTER — Inpatient Hospital Stay
Admission: RE | Admit: 2015-04-09 | Payer: Commercial Managed Care - HMO | Source: Ambulatory Visit | Admitting: Radiation Oncology

## 2015-04-09 ENCOUNTER — Telehealth: Payer: Self-pay

## 2015-04-09 DIAGNOSIS — C3492 Malignant neoplasm of unspecified part of left bronchus or lung: Secondary | ICD-10-CM

## 2015-04-09 NOTE — Progress Notes (Signed)
No show.

## 2015-04-09 NOTE — Telephone Encounter (Signed)
Anne with [facility] called to report that during a transition of care call with patient, pt reported to her that she has not taken any of her medications in 3 days (including lasix) because they "disappeared."  Patient has refills ready to be picked up by told the RN she didn't know who would get them for her.  Also, [facility] RN reporting pt sounded audibly congested, pt endorses a "cold."  She has increased her supplemental O2 to 5L for comfort.  Pt is scheduled to be seen on 1/12.    I called patient.. She states "I'm coughing a whole lot, I mean, I'm sick."  She reports not taking her medicine because "I lost it."  She has someone working on replacing it with what is waiting at the pharmacy.  Pt endorses increased shortness of breath, green sputum, frequent cough.  Denies any additional swelling to her lower extremities.  She agrees to be seen tomorrow afternoon, she does not want to end up back in the hospital.  Pt scheduled with Dr. Lovena Le at 3:30pm.

## 2015-04-10 ENCOUNTER — Inpatient Hospital Stay (HOSPITAL_COMMUNITY): Payer: Commercial Managed Care - HMO

## 2015-04-10 ENCOUNTER — Encounter: Payer: Self-pay | Admitting: Internal Medicine

## 2015-04-10 ENCOUNTER — Inpatient Hospital Stay (HOSPITAL_COMMUNITY)
Admission: AD | Admit: 2015-04-10 | Discharge: 2015-04-13 | DRG: 190 | Disposition: A | Discharge status: Home / Self Care | Payer: Commercial Managed Care - HMO | Source: Ambulatory Visit | Attending: Student in an Organized Health Care Education/Training Program | Admitting: Student in an Organized Health Care Education/Training Program

## 2015-04-10 ENCOUNTER — Other Ambulatory Visit: Payer: Self-pay | Admitting: *Deleted

## 2015-04-10 ENCOUNTER — Encounter (HOSPITAL_COMMUNITY): Payer: Self-pay | Admitting: Rehabilitation

## 2015-04-10 ENCOUNTER — Ambulatory Visit (INDEPENDENT_AMBULATORY_CARE_PROVIDER_SITE_OTHER): Payer: Commercial Managed Care - HMO | Admitting: Internal Medicine

## 2015-04-10 VITALS — BP 106/47 | HR 105 | Temp 98.7°F | Ht 64.0 in | Wt 253.3 lb

## 2015-04-10 DIAGNOSIS — I5033 Acute on chronic diastolic (congestive) heart failure: Secondary | ICD-10-CM | POA: Diagnosis not present

## 2015-04-10 DIAGNOSIS — Z923 Personal history of irradiation: Secondary | ICD-10-CM | POA: Diagnosis not present

## 2015-04-10 DIAGNOSIS — E538 Deficiency of other specified B group vitamins: Secondary | ICD-10-CM | POA: Diagnosis present

## 2015-04-10 DIAGNOSIS — R911 Solitary pulmonary nodule: Secondary | ICD-10-CM | POA: Diagnosis present

## 2015-04-10 DIAGNOSIS — I872 Venous insufficiency (chronic) (peripheral): Secondary | ICD-10-CM | POA: Diagnosis present

## 2015-04-10 DIAGNOSIS — Z9114 Patient's other noncompliance with medication regimen: Secondary | ICD-10-CM

## 2015-04-10 DIAGNOSIS — Z9119 Patient's noncompliance with other medical treatment and regimen: Secondary | ICD-10-CM | POA: Diagnosis not present

## 2015-04-10 DIAGNOSIS — J441 Chronic obstructive pulmonary disease with (acute) exacerbation: Secondary | ICD-10-CM | POA: Diagnosis not present

## 2015-04-10 DIAGNOSIS — R0602 Shortness of breath: Secondary | ICD-10-CM | POA: Diagnosis not present

## 2015-04-10 DIAGNOSIS — K219 Gastro-esophageal reflux disease without esophagitis: Secondary | ICD-10-CM | POA: Diagnosis present

## 2015-04-10 DIAGNOSIS — E872 Acidosis: Secondary | ICD-10-CM | POA: Diagnosis not present

## 2015-04-10 DIAGNOSIS — F329 Major depressive disorder, single episode, unspecified: Secondary | ICD-10-CM | POA: Diagnosis present

## 2015-04-10 DIAGNOSIS — C3490 Malignant neoplasm of unspecified part of unspecified bronchus or lung: Secondary | ICD-10-CM | POA: Diagnosis not present

## 2015-04-10 DIAGNOSIS — Z79899 Other long term (current) drug therapy: Secondary | ICD-10-CM

## 2015-04-10 DIAGNOSIS — I5032 Chronic diastolic (congestive) heart failure: Secondary | ICD-10-CM

## 2015-04-10 DIAGNOSIS — I2510 Atherosclerotic heart disease of native coronary artery without angina pectoris: Secondary | ICD-10-CM | POA: Diagnosis present

## 2015-04-10 DIAGNOSIS — G4730 Sleep apnea, unspecified: Secondary | ICD-10-CM | POA: Diagnosis present

## 2015-04-10 DIAGNOSIS — R300 Dysuria: Secondary | ICD-10-CM | POA: Insufficient documentation

## 2015-04-10 DIAGNOSIS — R05 Cough: Secondary | ICD-10-CM | POA: Diagnosis not present

## 2015-04-10 DIAGNOSIS — Z9981 Dependence on supplemental oxygen: Secondary | ICD-10-CM

## 2015-04-10 DIAGNOSIS — J449 Chronic obstructive pulmonary disease, unspecified: Secondary | ICD-10-CM

## 2015-04-10 DIAGNOSIS — Z85118 Personal history of other malignant neoplasm of bronchus and lung: Secondary | ICD-10-CM

## 2015-04-10 DIAGNOSIS — Z888 Allergy status to other drugs, medicaments and biological substances status: Secondary | ICD-10-CM | POA: Diagnosis not present

## 2015-04-10 DIAGNOSIS — Z720 Tobacco use: Secondary | ICD-10-CM | POA: Diagnosis present

## 2015-04-10 DIAGNOSIS — Z955 Presence of coronary angioplasty implant and graft: Secondary | ICD-10-CM | POA: Diagnosis not present

## 2015-04-10 DIAGNOSIS — R7303 Prediabetes: Secondary | ICD-10-CM | POA: Diagnosis present

## 2015-04-10 DIAGNOSIS — E785 Hyperlipidemia, unspecified: Secondary | ICD-10-CM | POA: Diagnosis present

## 2015-04-10 DIAGNOSIS — R600 Localized edema: Secondary | ICD-10-CM | POA: Diagnosis not present

## 2015-04-10 DIAGNOSIS — J9601 Acute respiratory failure with hypoxia: Secondary | ICD-10-CM | POA: Diagnosis not present

## 2015-04-10 DIAGNOSIS — F17210 Nicotine dependence, cigarettes, uncomplicated: Secondary | ICD-10-CM | POA: Diagnosis not present

## 2015-04-10 DIAGNOSIS — I272 Other secondary pulmonary hypertension: Secondary | ICD-10-CM | POA: Diagnosis present

## 2015-04-10 DIAGNOSIS — C3492 Malignant neoplasm of unspecified part of left bronchus or lung: Secondary | ICD-10-CM | POA: Diagnosis present

## 2015-04-10 DIAGNOSIS — G4733 Obstructive sleep apnea (adult) (pediatric): Secondary | ICD-10-CM | POA: Diagnosis present

## 2015-04-10 LAB — CBC WITH DIFFERENTIAL/PLATELET
BASOS ABS: 0 10*3/uL (ref 0.0–0.1)
BASOS PCT: 0 %
Eosinophils Absolute: 0.1 10*3/uL (ref 0.0–0.7)
Eosinophils Relative: 1 %
HEMATOCRIT: 41.2 % (ref 36.0–46.0)
Hemoglobin: 12.6 g/dL (ref 12.0–15.0)
LYMPHS PCT: 13 %
Lymphs Abs: 1 10*3/uL (ref 0.7–4.0)
MCH: 30.2 pg (ref 26.0–34.0)
MCHC: 30.6 g/dL (ref 30.0–36.0)
MCV: 98.8 fL (ref 78.0–100.0)
MONO ABS: 0.7 10*3/uL (ref 0.1–1.0)
Monocytes Relative: 9 %
NEUTROS ABS: 6 10*3/uL (ref 1.7–7.7)
Neutrophils Relative %: 77 %
PLATELETS: 164 10*3/uL (ref 150–400)
RBC: 4.17 MIL/uL (ref 3.87–5.11)
RDW: 14.1 % (ref 11.5–15.5)
WBC: 7.9 10*3/uL (ref 4.0–10.5)

## 2015-04-10 LAB — BLOOD GAS, ARTERIAL
ACID-BASE EXCESS: 8.2 mmol/L — AB (ref 0.0–2.0)
Bicarbonate: 34.4 mEq/L — ABNORMAL HIGH (ref 20.0–24.0)
Drawn by: 358491
O2 CONTENT: 4 L/min
O2 Saturation: 92.1 %
PATIENT TEMPERATURE: 98.6
PCO2 ART: 69.8 mmHg — AB (ref 35.0–45.0)
PO2 ART: 64.7 mmHg — AB (ref 80.0–100.0)
TCO2: 36.5 mmol/L (ref 0–100)
pH, Arterial: 7.313 — ABNORMAL LOW (ref 7.350–7.450)

## 2015-04-10 LAB — COMPREHENSIVE METABOLIC PANEL
ALBUMIN: 2.8 g/dL — AB (ref 3.5–5.0)
ALT: 39 U/L (ref 14–54)
AST: 29 U/L (ref 15–41)
Alkaline Phosphatase: 186 U/L — ABNORMAL HIGH (ref 38–126)
Anion gap: 7 (ref 5–15)
BILIRUBIN TOTAL: 0.5 mg/dL (ref 0.3–1.2)
BUN: 5 mg/dL — AB (ref 6–20)
CHLORIDE: 96 mmol/L — AB (ref 101–111)
CO2: 37 mmol/L — ABNORMAL HIGH (ref 22–32)
CREATININE: 0.72 mg/dL (ref 0.44–1.00)
Calcium: 8.9 mg/dL (ref 8.9–10.3)
GFR calc Af Amer: >60 mL/min (ref >60)
GFR calc non Af Amer: >60 mL/min (ref >60)
GLUCOSE: 131 mg/dL — AB (ref 65–99)
POTASSIUM: 4.2 mmol/L (ref 3.5–5.1)
Sodium: 140 mmol/L (ref 135–145)
Total Protein: 6.1 g/dL — ABNORMAL LOW (ref 6.5–8.1)

## 2015-04-10 LAB — URINALYSIS, ROUTINE W REFLEX MICROSCOPIC
Bilirubin Urine: NEGATIVE
GLUCOSE, UA: NEGATIVE mg/dL
Hgb urine dipstick: NEGATIVE
Ketones, ur: NEGATIVE mg/dL
LEUKOCYTES UA: NEGATIVE
Nitrite: NEGATIVE
PH: 5.5 (ref 5.0–8.0)
Protein, ur: NEGATIVE mg/dL
Specific Gravity, Urine: 1.008 (ref 1.005–1.030)

## 2015-04-10 LAB — BRAIN NATRIURETIC PEPTIDE: B NATRIURETIC PEPTIDE 5: 414 pg/mL — AB (ref 0.0–100.0)

## 2015-04-10 LAB — MAGNESIUM: Magnesium: 1.8 mg/dL (ref 1.7–2.4)

## 2015-04-10 MED ORDER — ASPIRIN 81 MG PO CHEW
81.0000 mg | CHEWABLE_TABLET | Freq: Every day | ORAL | Status: DC
  Administered 2015-04-10 – 2015-04-12 (×3): 81 mg via ORAL
  Filled 2015-04-10 (×3): qty 1

## 2015-04-10 MED ORDER — PREDNISONE 20 MG PO TABS
40.0000 mg | ORAL_TABLET | Freq: Every day | ORAL | Status: DC
  Administered 2015-04-11 – 2015-04-13 (×3): 40 mg via ORAL
  Filled 2015-04-10 (×3): qty 2

## 2015-04-10 MED ORDER — IPRATROPIUM-ALBUTEROL 0.5-2.5 (3) MG/3ML IN SOLN
3.0000 mL | RESPIRATORY_TRACT | Status: DC
  Administered 2015-04-10 – 2015-04-11 (×7): 3 mL via RESPIRATORY_TRACT
  Filled 2015-04-10 (×7): qty 3

## 2015-04-10 MED ORDER — ALBUTEROL SULFATE (2.5 MG/3ML) 0.083% IN NEBU
2.5000 mg | INHALATION_SOLUTION | Freq: Four times a day (QID) | RESPIRATORY_TRACT | Status: DC | PRN
  Administered 2015-04-12: 2.5 mg via RESPIRATORY_TRACT
  Filled 2015-04-10: qty 3

## 2015-04-10 MED ORDER — PANTOPRAZOLE SODIUM 40 MG PO TBEC
40.0000 mg | DELAYED_RELEASE_TABLET | Freq: Every day | ORAL | Status: DC
  Administered 2015-04-11 – 2015-04-13 (×3): 40 mg via ORAL
  Filled 2015-04-10 (×3): qty 1

## 2015-04-10 MED ORDER — IPRATROPIUM-ALBUTEROL 0.5-2.5 (3) MG/3ML IN SOLN: 3.0000 mL | RESPIRATORY_TRACT | Status: DC

## 2015-04-10 MED ORDER — PREDNISONE 20 MG PO TABS
40.0000 mg | ORAL_TABLET | Freq: Once | ORAL | Status: AC
  Administered 2015-04-10: 40 mg via ORAL

## 2015-04-10 MED ORDER — GUAIFENESIN-CODEINE 100-10 MG/5ML PO SOLN
5.0000 mL | Freq: Four times a day (QID) | ORAL | Status: DC | PRN
  Administered 2015-04-10 – 2015-04-11 (×2): 5 mL via ORAL
  Filled 2015-04-10 (×2): qty 5

## 2015-04-10 MED ORDER — ENOXAPARIN SODIUM 40 MG/0.4ML ~~LOC~~ SOLN: 40.0000 mg | SUBCUTANEOUS | Status: DC

## 2015-04-10 MED ORDER — PRAVASTATIN SODIUM 40 MG PO TABS
40.0000 mg | ORAL_TABLET | Freq: Every day | ORAL | Status: DC
  Administered 2015-04-10 – 2015-04-12 (×3): 40 mg via ORAL
  Filled 2015-04-10 (×3): qty 1

## 2015-04-10 MED ORDER — ACETAMINOPHEN 650 MG RE SUPP: 650.0000 mg | Freq: Four times a day (QID) | RECTAL | Status: DC | PRN

## 2015-04-10 MED ORDER — FUROSEMIDE 10 MG/ML IJ SOLN
40.0000 mg | Freq: Three times a day (TID) | INTRAMUSCULAR | Status: DC
  Administered 2015-04-10 – 2015-04-11 (×3): 40 mg via INTRAVENOUS
  Filled 2015-04-10 (×3): qty 4

## 2015-04-10 MED ORDER — ACETAMINOPHEN 325 MG PO TABS
650.0000 mg | ORAL_TABLET | Freq: Four times a day (QID) | ORAL | Status: DC | PRN
  Administered 2015-04-13: 650 mg via ORAL
  Filled 2015-04-10: qty 2

## 2015-04-10 MED ORDER — SODIUM CHLORIDE 0.9 % IJ SOLN: 3.0000 mL | Freq: Two times a day (BID) | INTRAMUSCULAR | Status: DC

## 2015-04-10 MED ORDER — AZITHROMYCIN 250 MG PO TABS
250.0000 mg | ORAL_TABLET | Freq: Every day | ORAL | Status: DC
  Administered 2015-04-11 – 2015-04-13 (×3): 250 mg via ORAL
  Filled 2015-04-10 (×3): qty 1

## 2015-04-10 MED ORDER — ENOXAPARIN SODIUM 40 MG/0.4ML ~~LOC~~ SOLN
40.0000 mg | SUBCUTANEOUS | Status: DC
  Administered 2015-04-10 – 2015-04-12 (×3): 40 mg via SUBCUTANEOUS
  Filled 2015-04-10 (×3): qty 0.4

## 2015-04-10 MED ORDER — AZITHROMYCIN 500 MG PO TABS
500.0000 mg | ORAL_TABLET | Freq: Every day | ORAL | Status: AC
  Administered 2015-04-10: 500 mg via ORAL
  Filled 2015-04-10: qty 1

## 2015-04-10 MED ORDER — PREDNISONE 20 MG PO TABS
40.0000 mg | ORAL_TABLET | Freq: Every day | ORAL | Status: DC
  Filled 2015-04-10: qty 2

## 2015-04-10 MED ORDER — SODIUM CHLORIDE 0.9 % IJ SOLN
3.0000 mL | Freq: Two times a day (BID) | INTRAMUSCULAR | Status: DC
  Administered 2015-04-10 – 2015-04-13 (×6): 3 mL via INTRAVENOUS

## 2015-04-10 NOTE — Addendum Note (Signed)
Addended by: Viviano Simas A on: 04/10/2015 04:04 PM   Modules accepted: Orders

## 2015-04-10 NOTE — Progress Notes (Signed)
CRITICAL VALUE ALERT  Critical value received:  CO2 69.8. PH 7.31, Po2 64.2, Bicarb 36.5  Date of notification: 04/10/2015  Time of notification:  17:45  Critical value read back:yes  Nurse who received alert:  Rafael Bihari  MD notified (1st page): Dunn  Time of first page:  17:52  MD notified (2nd page): Jule Ser  Time of second page:17:55  Responding MD:  Edward Qualia  Time MD responded:  17:56

## 2015-04-10 NOTE — Patient Outreach (Signed)
[facility]) Care Management   04/10/2015  [name] 05-Nov-1955 [id number]  [name] is an 60 y.o. female  Subjective:  Pt reports she got all of  her medications, roommate's son placed bags of medications in a different spot.  Pt reports she did not take her Lasix today- to see MD.  Pt reports sob when she does anything.   Pt reports on cough- when she does bring  something up, is a lot of green sputum but does get clearer.  Pt reports has swelling in legs.   Objective:   Filed Vitals:   04/10/15 1321  BP: 110/70  Pulse: 97  Resp: 28    ROS  Physical Exam  Constitutional: She is oriented to person, place, and time. She appears well-developed and well-nourished.  Respiratory: She has wheezes.  Wheezing noted on inspiration in anterior right upper lobe,congestion on inspiration in left lower lobe posteriorly  Diminished sounds in right lower lobe posteriorly   Musculoskeletal: She exhibits edema.  2+ edema in bilateral lower legs,top of right foot, 2-3+ top of left foot.   Neurological: She is alert and oriented to person, place, and time.  Skin: Skin is warm and dry.  Psychiatric: She has a normal mood and affect. Her behavior is normal. Judgment and thought content normal.    Current Medications:  Reviewed with pt.  Current Outpatient Prescriptions  Medication Sig Dispense Refill  . acetaminophen (TYLENOL) 500 MG tablet Take 500 mg by mouth every 6 (six) hours as needed for moderate pain.     Marland Kitchen albuterol (VENTOLIN HFA) 108 (90 BASE) MCG/ACT inhaler Inhale 2 puffs into the lungs every 6 (six) hours as needed for shortness of breath. 18 g 11  . aspirin 81 MG chewable tablet Chew 1 tablet (81 mg total) by mouth at bedtime. 90 tablet 3  . Fluticasone-Salmeterol (ADVAIR DISKUS) 250-50 MCG/DOSE AEPB Inhale 1 puff into the lungs 2 (two) times daily. 60 each 5  . furosemide (LASIX) 40 MG tablet Take 1 tablet (40 mg total) by mouth daily. 90 tablet 3  . lovastatin  (MEVACOR) 40 MG tablet Take 1 tablet (40 mg total) by mouth at bedtime. 30 tablet 11  . OXYGEN Inhale 5 L into the lungs continuous.    . pantoprazole (PROTONIX) 40 MG tablet Take 1 tablet (40 mg total) by mouth daily. 30 tablet 3  . potassium chloride SA (K-DUR,KLOR-CON) 20 MEQ tablet Take 2 tablets (40 mEq total) by mouth daily as needed (Take when taking lasix). (Patient taking differently: Take 20 mEq by mouth daily as needed (Take when taking lasix). ) 180 tablet 3  . tiotropium (SPIRIVA HANDIHALER) 18 MCG inhalation capsule Place 1 capsule (18 mcg total) into inhaler and inhale daily. 30 capsule 12  . albuterol (PROVENTIL) (2.5 MG/3ML) 0.083% nebulizer solution Take 3 mLs (2.5 mg total) by nebulization every 6 (six) hours as needed for shortness of breath. (Patient not taking: Reported on 04/10/2015) 360 mL 3  . azithromycin (ZITHROMAX) 250 MG tablet Take 1 tablet (250 mg total) by mouth daily. (Patient not taking: Reported on 03/27/2015) 2 tablet 0  . gabapentin (NEURONTIN) 300 MG capsule Take 1 capsule (300 mg total) by mouth 3 (three) times daily. (Patient not taking: Reported on 04/10/2015) 90 capsule 11  . nicotine (NICODERM CQ - DOSED IN MG/24 HOURS) 14 mg/24hr patch Place 1 patch (14 mg total) onto the skin daily. (Patient not taking: Reported on 04/10/2015) 90 patch 3  .  nicotine polacrilex (NICORETTE) 4 MG gum Take 1 each (4 mg total) by mouth as needed for smoking cessation. (Patient not taking: Reported on 03/27/2015) 100 tablet 0   No current facility-administered medications for this visit.    Functional Status:   In your present state of health, do you have any difficulty performing the following activities: 03/25/2015 03/15/2015  Hearing? N N  Vision? Y N  Difficulty concentrating or making decisions? N N  Walking or climbing stairs? Y N  Dressing or bathing? N N  Doing errands, shopping? N Y    Fall/Depression Screening:    PHQ 2/9 Scores 03/25/2015 01/16/2015 10/08/2014  10/02/2014 09/11/2014 07/23/2014 04/10/2014  PHQ - 2 Score _0 0 1  PHQ- 9 Score _1 - 10 - -    Assessment:  COPD-   Pt coughed (croupy) several times, wheezing noted on inspiration in right upper lobe anterior, congestion on inspiration left lower lobe posterior, diminished right lower lobe posterior.   2 + edema in bilateral lower legs/ top of right foot, 2-3 + top of left foot.     Plan:  Pt to f/u with Dr. Lovena Le today- acute office visit (cough, sob, edema)            Per pt, to f/u with cancer MD next week.             Plan to f/u again with pt  telephonically on 1/11 as part of ongoing transition of care.             Plan to route in Epic today's  home visit to Dr. Eppie Gibson  [facility] CM Care Plan Problem Two        Most Recent Value   Care Plan Problem Two  Risk for readmission- 6 admits in last 6 months, most recent COPD exacerbation    Role Documenting the Problem Two  Care Management Coordinator   Care Plan for Problem Two  Active   Interventions for Problem Two Long Term Goal   Discussed with pt ongoing medcation compliance, f/u with MD post discharge - pt to see Dr. Melburn Hake 12/21   [place] Term Goal (31-90) days  Pt would not readmit 31days from day of discharge    [facility] Long Term Goal Start Date  03/20/15   [facility] CM Short Term Goal #1 (0-30 days)  Pt would overdue activities (recent move), pace herself for the next 30 days    [facility] CM Short Term Goal #1 Start Date  03/20/15   Interventions for Short Term Goal #2   Discussed with pt importance of pacing activities post discharge (as reported, pt overdid it with recent move).    [facility] CM Short Term Goal #2 (0-30 days)  Pt will see improvement in cough, sob in the next 7 days    [facility] CM Short Term Goal #2 Start Date  04/10/15   Interventions for Short Term Goal #2  Reviewed with pt s/s to report to MD.  Pt scheduled to f/u with MD today 1/6- cough, increased sob.      Zara Chess.   [facility]   [phone]

## 2015-04-10 NOTE — Assessment & Plan Note (Signed)
A/P:  Acute Exacerbation of Severe COPD: GOLD Stage 3D (PFTs Aug 2016), presenting with increased SOB, cough, and purulent sputum. Given other URI symptoms and diffuse breath sounds, etiology likely viral or atypical bacterial PNA more than focal PNA. However, symptoms likely worsened by HFpEF exacerbation as well. PE felt to be less likely given her classic symptom triad, but patient does have lung cancer. Due to her other comorbidities, including CAD, HFpEF and lung cancer, as well as poor livinig situation, inpatient admission warranted for further workup and management.  - ADMIT patient to telemetry  - Consider Azithromycin and Prednisone  - Consider Duonebs q4h PRN  - Consider CXR, BNP, Troponin  - Consider Lasix 40 mg IV BID

## 2015-04-10 NOTE — Assessment & Plan Note (Signed)
HFpEF exacerbation: Grade 1 diastolic dysfunction on Echo from feb 2016.  Now presenting with 10 lb weight gain and 2+ LE edema to knee.  Exacerbation likely 2/2 medication noncompliance, with patient not taking her Lasix.  Patient unable to manage treatment with increased Lasix dosing at home. - ADMIT patient - Consider BNP - Consider Lasix 40 mg IV BID

## 2015-04-10 NOTE — Progress Notes (Signed)
Patient ID: [name], female   DOB: [birth year], 60 y.o.   MRN: [id number]    Subjective:   Patient ID: POETRY CERRO female   DOB: 10-02-1955 60 y.o.   MRN: [id number]  HPI: Ms.Aven B Heyliger is a 60 y.o. female with PMH as below, here for evaluation of SOB and leg swelling.  Please see Problem-Based charting for the status of the patient's chronic medical issues.  Ms. Trieu is a 60 yo woman with PMH of Stage 2 NSCLC (completed radiation therapy 11/23), COPD (4L [place] at home, FEV1 42%, FEV1/FVC 88% 12/02/14), pulmonary HTN, CAD s/p DES 2010, CHF (EF 50-55%), BPPV, and B12 deficiency.  She presents with a 4-5 day history of worsening SOB and cough with increased production of green sputum.  Her sputum is normally clear, but now green.  She has been using her albuterol inhaler 6-7 times per day recently without relief.  She reports appropriate use of her Spiriva and Advair inhalers.  She finished a course of Prednisone and Bactrim in December for COPD exacerbation.  She reports feeling back to her baseline after the treatment course, but now feels "much worse."  She also complains of 1 week history of worsening LE edema.  She has also had a 10 lb weight gain since her last clinic visit.  She always sleeps sitting upright.  She has not been taking her Lasix at home for the last week because she lives in an apartment with 7 other people and she does not want to have to wait to urinate.  She has also been experiencing urinary incontinence for the last month, causing her to sometimes urinate on herself.    She denies chest pain, but reports pain from coughing.  She endorses subjective fever, chills, runny nose, and sore throat.  Her roommate was recently sick with Strep throat, but she does not think her sore throat feels like Strep throat.  She reports 2-3 days of dysuria, described as burning with urination. She denies odd color or odor of blood in the urine.  She denies dysphagia, N/V, constipation, diarrhea,  or hematochezia.  In the clinic, she was saturating 86% on her home O2 4L [place].  O2 sat improved to 88-90% on clinic portable O2 4L [place].  Patient does not believe she would be able to manage increased Lasix dosing at home.   Past Medical History  Diagnosis Date  . Coronary artery disease     S/P PCI of LAD with DES (12/2008). Total occlusion of RCA noted at that time., medically managed. ACS ruled out 03/2009 with Lexiscan myoview . Followed by [place].  . Pulmonary hypertension ([place])     2-D Echo (61/4431) - Systolic pressure was moderately increased. PA peak pressure  19mHg. 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 Followed by Dr. WJoya Gaskins([facility]  . Diastolic dysfunction     2-D Echo (12/2008) - Normal LV Systolic funciton with EF 60-65%. Grade 1 diastolid dysfunction. No regional wall motion abnormalities. Moderate pulmonary HTN with PA peak pressure 537mg.  . Marland KitchenOPD (chronic obstructive pulmonary disease) (HCC)     Severe. Gold Stage IV.  PFTs (12/2008) - severe obstructive airway disease. Active tobacco use. Requires 4L O2 at home.  . Pulmonary nodule, right     Small right middle lobe nodule. Stable as of 12/2008.  . Marland Kitchenrediabetes     HgbA1c 6.4 (12/2008)  .  Hx MRSA infection     Recurrent MRSA thigh abscesses.  . Obesity   . Hyperlipidemia   . GERD (gastroesophageal reflux disease)     S/P Nissen fundoplication.  . CHF (congestive heart failure) ([place])   . On home oxygen therapy since 2010    4L all the time  . Shortness of breath dyspnea     with a lot of exertion; if fluid builds up  . Pneumonia   . Depression   . Full dentures   . Chronic bronchitis ([place])     "get it most years" (03/03/2015)  . Lung cancer ([place])     "left"  . History of hiatal hernia   . Arthritis     "some scattered" (03/03/2015)   Current Outpatient Prescriptions  Medication Sig Dispense Refill    . acetaminophen (TYLENOL) 500 MG tablet Take 500 mg by mouth every 6 (six) hours as needed for moderate pain.     Marland Kitchen albuterol (PROVENTIL) (2.5 MG/3ML) 0.083% nebulizer solution Take 3 mLs (2.5 mg total) by nebulization every 6 (six) hours as needed for shortness of breath. (Patient not taking: Reported on 04/10/2015) 360 mL 3  . albuterol (VENTOLIN HFA) 108 (90 BASE) MCG/ACT inhaler Inhale 2 puffs into the lungs every 6 (six) hours as needed for shortness of breath. 18 g 11  . aspirin 81 MG chewable tablet Chew 1 tablet (81 mg total) by mouth at bedtime. 90 tablet 3  . azithromycin (ZITHROMAX) 250 MG tablet Take 1 tablet (250 mg total) by mouth daily. (Patient not taking: Reported on 03/27/2015) 2 tablet 0  . Fluticasone-Salmeterol (ADVAIR DISKUS) 250-50 MCG/DOSE AEPB Inhale 1 puff into the lungs 2 (two) times daily. 60 each 5  . furosemide (LASIX) 40 MG tablet Take 1 tablet (40 mg total) by mouth daily. 90 tablet 3  . gabapentin (NEURONTIN) 300 MG capsule Take 1 capsule (300 mg total) by mouth 3 (three) times daily. (Patient not taking: Reported on 04/10/2015) 90 capsule 11  . lovastatin (MEVACOR) 40 MG tablet Take 1 tablet (40 mg total) by mouth at bedtime. 30 tablet 11  . nicotine (NICODERM CQ - DOSED IN MG/24 HOURS) 14 mg/24hr patch Place 1 patch (14 mg total) onto the skin daily. (Patient not taking: Reported on 04/10/2015) 90 patch 3  . nicotine polacrilex (NICORETTE) 4 MG gum Take 1 each (4 mg total) by mouth as needed for smoking cessation. (Patient not taking: Reported on 03/27/2015) 100 tablet 0  . OXYGEN Inhale 5 L into the lungs continuous.    . pantoprazole (PROTONIX) 40 MG tablet Take 1 tablet (40 mg total) by mouth daily. 30 tablet 3  . potassium chloride SA (K-DUR,KLOR-CON) 20 MEQ tablet Take 2 tablets (40 mEq total) by mouth daily as needed (Take when taking lasix). (Patient taking differently: Take 20 mEq by mouth daily as needed (Take when taking lasix). ) 180 tablet 3  . tiotropium  (SPIRIVA HANDIHALER) 18 MCG inhalation capsule Place 1 capsule (18 mcg total) into inhaler and inhale daily. 30 capsule 12   No current facility-administered medications for this visit.   Family History  Problem Relation Age of Onset  . Heart disease Mother 56    Deceased from MI at 52yo  . Hypertension Mother   . Heart disease Father 64    Deceased of MI age 33yo  . Hypertension Father   . Hypertension Brother   . Lung cancer      Grandmother   Social History  Social History  . Marital Status: Divorced    Spouse Name: N/A  . Number of Children: N/A  . Years of Education: N/A   Social History Main Topics  . Smoking status: Current Every Day Smoker -- 2.00 packs/day for 45 years    Types: Cigarettes    Start date: 04/04/1969  . Smokeless tobacco: Never Used     Comment: 2 per day  . Alcohol Use: No  . Drug Use: No  . Sexual Activity: Not Currently    Birth Control/ Protection: None   Other Topics Concern  . Not on file   Social History Narrative   Formerly worked as a Scientist, water quality, now disabled.   Divorced.   2 grown children.   Lives with her grandson.   Review of Systems: Pertinent items noted in HPI and remainder of 10 point ROS otherwise negative.  Objective:  Physical Exam: There were no vitals filed for this visit. Physical Exam  Constitutional: She is oriented to person, place, and time.  Obese female, sitting in chair, [place] in place.  Appears older than stated age.  Uncomfortable appearing but in NAD.  HENT:  Head: Normocephalic and atraumatic.  Eyes: EOM are normal. No scleral icterus.  Neck: No tracheal deviation present.  JVD could not be appreciated 2/2 body habitus.  Cardiovascular:  Tachycardic, regular rhythm. Heart sounds distant but no murmurs appreciated.  Distal pulses could not be palpated through edema, but feet warm to touch.  Pulmonary/Chest:  Poor inspiratory effort.  Diffuse, coarse, inspiratory and expiratory breath sounds throughout.   No crackles or wheezes appreciated.  No use of accessory muscles.  Abdominal: Soft. She exhibits no distension. There is no tenderness. There is no rebound and no guarding.  Musculoskeletal:  3+ swelling of feet bilaterally.  2+ edema to knee bilaterally.  Neurological: She is alert and oriented to person, place, and time.  Skin: Skin is warm and dry. No rash noted. There is erythema.     Assessment & Plan:   Ms. Mitter is a 60 yo woman with PMH of Stage 2 NSCLC (completed radiation therapy 11/23), COPD (4L [place] at home, FEV1 42%, FEV1/FVC 88% 12/02/14), pulmonary HTN, CAD s/p DES 2010, CHF (EF 50-55%), BPPV, and B12 deficiency.  Acute Exacerbation of Severe COPD: GOLD Stage 3D (PFTs Aug 2016), presenting with increased SOB, cough, and purulent sputum.  Given other URI symptoms and diffuse breath sounds, etiology likely viral or atypical bacterial PNA more than focal PNA.  However, symptoms likely worsened by HFpEF exacerbation as well.  PE felt to be less likely given her classic symptom triad, but patient does have lung cancer.  Due to her other comorbidities, including CAD, HFpEF and lung cancer, as well as poor livinig situation, inpatient admission warranted for further workup and management.  - ADMIT patient to telemetry - Consider Azithromycin and Prednisone - Consider Duonebs q4h PRN - Consider CXR, BNP, Troponin - Consider Lasix 40 mg IV BID  HFpEF exacerbation: Grade 1 diastolic dysfunction on Echo from feb 2016.  Now presenting with 10 lb weight gain and 2+ LE edema to knee.  Exacerbation likely 2/2 medication noncompliance, with patient not taking her Lasix.   - Consider BNP - Consider Lasix 40 mg IV BID  Dysuria: 3 day h/o burning with urination. - Recommend U/A and culture  H/o Stage 2a NSCLC: s/p radiation therapy completed 11/23: Followed by Dr. Pablo Ledger ([facility]).Marland Kitchen  GERD: Protonix 40 mg daily HLD: Pravastatin  Patient and  case were discussed with Dr.  Daryll Drown.  Please refer to Problem Based charting for further documentation.

## 2015-04-10 NOTE — Patient Instructions (Signed)
You are being admitted for management of your COPD and heart failure.

## 2015-04-10 NOTE — H&P (Signed)
Date: 04/10/2015               Patient Name:  [name] MRN: [id number]  DOB: 1955/12/17 Age / Sex: 60 y.o., female   PCP: Oval Linsey, MD         Medical Service: Internal Medicine Teaching Service         Attending Physician: Dr. Axel Filler, MD    First Contact: Dr. Jule Ser Pager: [phone]  Second Contact: Dr. Albin Felling  Pager: [phone]       After Hours (After 5p/  First Contact Pager: [phone]  weekends / holidays): Second Contact Pager: [phone]   Chief Complaint: shortness of breath and leg swelling  History of Present Illness: Mr. [name] is a 60 y.o. woman with past medical history of stage 2 NSCLC (completed radiation therapy on 02/25/2015), COPD on home oxygen requirement of 4L via [place] (FEV1 42%, FEV1/FVC 88% on 12/02/2014, pulmonary hypertension, CAD status post DES 2010, congestive heart failure with EF 50-55%, BPPV and vitamin B12 deficiency who presents as a direct admit from the Internal Medicine Clinic this afternoon.  She is complaining of a couple days worth of worsening cough productive of green sputum and shortness of breath on exertion.  She apparently has chronic sputum production that is clear.  She has had increased albuterol use of 6-7 times per day without relief.  She is appropriately using her Spirivia and Advair inhalers.  She finished a course of prednisone and and TMP-SMX for a COPD exacerbation in December.  Additionally, patient complains of approximately 1 week of Lasix non-compliance that has resulted in worsening lower extremity edema.  She has about a 10 pound weight gain since her last clinic visit and reports sleeping upright.  She states the reason for not taking her Lasix is because she lives in an apartment with 7 other people and has trouble waiting to urinate and has experienced occasional incontinence.  She denies chest pain, lightheadedness, abdominal pain, nausea, vomiting, diarrhea, constipation.  She does have pain from  coughing, subjective fever, chills, rhinorrhea, nasal congestion, sore throat, dysuria.   When seen in clinic, she was saturating at 86% on her home oxygen of 4L [place].  This improved to 88-90% on clinic portable oxygen 4L [place].  Patient expressed concern that she would not be able to manage an increased Lasix dosing at home and, thus, IMTS was called to admit patient.  Patient still smokes approximately 5 cigarettes per day but her history is much more extensive and she reports smoking upwards of 2 PPD's for many years.  Meds: No current facility-administered medications for this encounter.    Allergies: Allergies as of 04/10/2015 - Review Complete 04/10/2015  Allergen Reaction Noted  . Fluconazole Anaphylaxis, Itching, and Swelling   . Atorvastatin Other (See Comments) 09/09/2014   Past Medical History  Diagnosis Date  . Coronary artery disease     S/P PCI of LAD with DES (12/2008). Total occlusion of RCA noted at that time., medically managed. ACS ruled out 03/2009 with Lexiscan myoview . Followed by [facility].  . Pulmonary hypertension ([place])     2-D Echo (20/9470) - Systolic pressure was moderately increased. PA peak pressure  38mHg. 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 Followed by Dr. WJoya Gaskins(Velora Heckler  . Diastolic dysfunction     2-D Echo (12/2008) - Normal LV Systolic funciton with EF  60-65%. Grade 1 diastolid dysfunction. No regional wall motion abnormalities. Moderate pulmonary HTN with PA peak pressure 70mHg.  .Marland KitchenCOPD (chronic obstructive pulmonary disease) (HCC)     Severe. Gold Stage IV.  PFTs (12/2008) - severe obstructive airway disease. Active tobacco use. Requires 4L O2 at home.  . Pulmonary nodule, right     Small right middle lobe nodule. Stable as of 12/2008.  .Marland KitchenPrediabetes     HgbA1c 6.4 (12/2008)  . Hx MRSA infection     Recurrent MRSA thigh abscesses.  . Obesity    . Hyperlipidemia   . GERD (gastroesophageal reflux disease)     S/P Nissen fundoplication.  . CHF (congestive heart failure) ([place]   . On home oxygen therapy since 2010    4L all the time  . Shortness of breath dyspnea     with a lot of exertion; if fluid builds up  . Pneumonia   . Depression   . Full dentures   . Chronic bronchitis ([place]     "get it most years" (03/03/2015)  . Lung cancer ([place]     "left"  . History of hiatal hernia   . Arthritis     "some scattered" (03/03/2015)   Past Surgical History  Procedure Laterality Date  . Nissen fundoplication    . Right heart catheterization N/A 11/09/2012    Procedure: RIGHT HEART CATH;  Surgeon: DLarey Dresser MD;  [facility];  Service: Cardiovascular;  Laterality: N/A;  . Appendectomy    . Tonsillectomy    . Back surgery    . Knee arthroscopy Right ~ 2000  . Tubal ligation  1979  . Video bronchoscopy with endobronchial ultrasound N/A 12/15/2014    Procedure: VIDEO BRONCHOSCOPY WITH ENDOBRONCHIAL ULTRASOUND;  Surgeon: PIvin Poot MD;  [place]  Service: Thoracic;  Laterality: N/A;  . Coronary [facility]oplasty with stent placement  2010    [facility]  . Bilateral oophorectomy  1979  . Hernia repair    . Breast lumpectomy Right 1990's     (biopsy negative)  . Lumbar disc surgery  2001    "[facility]  . Abdominal hysterectomy  1980's    "still have my cervix"   Family History  Problem Relation Age of Onset  . Heart disease Mother 464   Deceased from MI at 453yo . Hypertension Mother   . Heart disease Father 531   Deceased of MI age 61yo . Hypertension Father   . Hypertension Brother   . Lung cancer      Grandmother   Social History   Social History  . Marital Status: Divorced    Spouse Name: N/A  . Number of Children: N/A  . Years of Education: N/A   Occupational History  . Not on file.   Social History Main Topics  . Smoking status: Current Every  Day Smoker -- 2.00 packs/day for 45 years    Types: Cigarettes    Start date: 04/04/1969  . Smokeless tobacco: Never Used     Comment: 2 per day  . Alcohol Use: No  . Drug Use: No  . Sexual Activity: Not Currently    Birth Control/ Protection: None   Other Topics Concern  . Not on file   Social History Narrative   Formerly worked as a cScientist, water quality now disabled.   Divorced.   2 grown children.   Lives with her grandson.  Review of Systems: Pertinent items are noted in HPI.  Physical Exam: There were no vitals taken for this visit.  Physical Exam  Constitutional: She is oriented to person, place, and time.  Obese female, sitting in wheelchair on oxygen via nasal canula.  No acute distress  HENT:  Head: Normocephalic and atraumatic.  Eyes: EOM are normal.  Cardiovascular: Normal rate and regular rhythm.   Heart sounds distant, but no obvious murmurs appreciated.  Pulmonary/Chest:  Poor inspiratory effort, but breath sounds are coarse throughout lung fields  Abdominal: Soft. There is no tenderness.  Musculoskeletal:  2+ pitting edema bilaterally of lower extremities extending up to her knees.  Know abdominal edema present.  Neurological: She is alert and oriented to person, place, and time. No cranial nerve deficit.  Skin: Skin is warm and dry. There is erythema (bilateral lower extremity edema likely due to venous stasis).  Psychiatric: She has a normal mood and affect.    Lab results: Basic Metabolic Panel: No results for input(s): NA, K, CL, CO2, GLUCOSE, BUN, CREATININE, CALCIUM, MG, PHOS in the last 72 hours. Liver Function Tests: No results for input(s): AST, ALT, ALKPHOS, BILITOT, PROT, ALBUMIN in the last 72 hours. No results for input(s): LIPASE, AMYLASE in the last 72 hours. No results for input(s): AMMONIA in the last 72 hours. CBC: No results for input(s): WBC, NEUTROABS, HGB, HCT, MCV, PLT in the last 72 hours. Cardiac Enzymes: No results for input(s):  CKTOTAL, CKMB, CKMBINDEX, TROPONINI in the last 72 hours. BNP: No results for input(s): PROBNP in the last 72 hours. D-Dimer: No results for input(s): DDIMER in the last 72 hours. CBG: No results for input(s): GLUCAP in the last 72 hours. Hemoglobin A1C: No results for input(s): HGBA1C in the last 72 hours. Fasting Lipid Panel: No results for input(s): CHOL, HDL, LDLCALC, TRIG, CHOLHDL, LDLDIRECT in the last 72 hours. Thyroid Function Tests: No results for input(s): TSH, T4TOTAL, FREET4, T3FREE, THYROIDAB in the last 72 hours. Anemia Panel: No results for input(s): VITAMINB12, FOLATE, FERRITIN, TIBC, IRON, RETICCTPCT in the last 72 hours. Coagulation: No results for input(s): LABPROT, INR in the last 72 hours. Urine Drug Screen: Drugs of Abuse     Component Value Date/Time   LABOPIA NONE DETECTED 07/01/2013 1218   LABOPIA NEGATIVE 02/04/2009 2315   COCAINSCRNUR NONE DETECTED 07/01/2013 1218   COCAINSCRNUR NEGATIVE 02/04/2009 2315   LABBENZ NONE DETECTED 07/01/2013 1218   LABBENZ NEGATIVE 02/04/2009 2315   AMPHETMU NONE DETECTED 07/01/2013 1218   AMPHETMU NEGATIVE 02/04/2009 2315   THCU NONE DETECTED 07/01/2013 1218   LABBARB NONE DETECTED 07/01/2013 1218    Alcohol Level: No results for input(s): ETH in the last 72 hours. Urinalysis: No results for input(s): COLORURINE, LABSPEC, PHURINE, GLUCOSEU, HGBUR, BILIRUBINUR, KETONESUR, PROTEINUR, UROBILINOGEN, NITRITE, LEUKOCYTESUR in the last 72 hours.  Invalid input(s): APPERANCEUR   Imaging results:  No results found.  Other results: EKG: none  Assessment & Plan by Problem: Active Problems:   * No active hospital problems. *  60 y.o. woman with past medical history of stage 2 NSCLC (completed radiation therapy on 02/25/2015), COPD on home oxygen requirement of 4L via [place] (FEV1 42%, FEV1/FVC 88% on 12/02/2014, pulmonary hypertension, CAD status post DES 2010, congestive heart failure with EF 50-55%, BPPV and vitamin B12  deficiency who presents as a direct admit from the Internal Medicine Clinic this afternoon.  Acute Exacerbation for Severe COPD: based on PFTs from August 2016, [name] has GOLD stage 3D COPD  who presents from clinic with increased SOB on exertion, worsening cough with increased sputum production.  She also reports other sick contacts and upper respiratory symptoms.  Her breath sounds are diffusely coarse throughout all lung fields - monitor on telemetry and check EKG - CBC - chest x-ray - check ABG - prednisone 66m daily x 5 days - albuterol nebs q6h prn - Duones q4h - Azithromycin 5070mtoday, then 25063m 4 days - Guaifenesin-Codeine 100-54m46mL 5mL 66m as needed  - supplemental oxygen to keep O2 saturation 92%. - smoking cessation  HFpEF exacerbation: Grade 1 diastolic dysfunction on Echo from feb 2016. Now presenting with 10 lb weight gain and 2+ LE edema to knee. Exacerbation likely 2/2 medication noncompliance, with patient not taking her Lasix. - check BNP - monitor on telemetry and check EKG - CBC - chest x-ray - daily weights, I/O's - Lasix 40mg 87mID  Dysuria: reports 3 days of burning with urination - check urinalysis and culture  History of Stage 2a NSCLC: status post radiation therapy completed 11/23  - Followed by Dr. WentwoPablo Ledger [facility])  GERD - Protonix 40 mg daily  HLD: on Lovastatin at home - Pravastatin  FEN: Fluids: none Electrolytes: check magnesium, BMET Nutrition: Heart healthy  DVT PPx: Lovenox  CODE: Full  Dispo: Disposition is deferred at this time, awaiting improvement of current medical problems.   The patient does have a current PCP (LawreOval Linseyand does not [facility].  The patient does have transportation limitations that hinder transportation to clinic appointments.  Signed: AndrewJule Ser/09/2015, 5:05 PM

## 2015-04-10 NOTE — Assessment & Plan Note (Signed)
Patient reports three day h/o burning with urination without odd color, odor, or blood.  She has had urinary incontinence for 1 month.  A/P: Possible UTI. - U/A and culture when admitted.

## 2015-04-10 NOTE — Progress Notes (Signed)
Patient arrived in the unit at 6:30 accompanied by RN and NT via wheelchair. Orientation given to the unit. Patient verbalized understanding.

## 2015-04-11 DIAGNOSIS — J441 Chronic obstructive pulmonary disease with (acute) exacerbation: Principal | ICD-10-CM

## 2015-04-11 DIAGNOSIS — K219 Gastro-esophageal reflux disease without esophagitis: Secondary | ICD-10-CM

## 2015-04-11 DIAGNOSIS — C3490 Malignant neoplasm of unspecified part of unspecified bronchus or lung: Secondary | ICD-10-CM

## 2015-04-11 DIAGNOSIS — I5032 Chronic diastolic (congestive) heart failure: Secondary | ICD-10-CM

## 2015-04-11 DIAGNOSIS — Z9114 Patient's other noncompliance with medication regimen: Secondary | ICD-10-CM

## 2015-04-11 DIAGNOSIS — R300 Dysuria: Secondary | ICD-10-CM

## 2015-04-11 DIAGNOSIS — Z9981 Dependence on supplemental oxygen: Secondary | ICD-10-CM

## 2015-04-11 DIAGNOSIS — R600 Localized edema: Secondary | ICD-10-CM

## 2015-04-11 DIAGNOSIS — Z923 Personal history of irradiation: Secondary | ICD-10-CM

## 2015-04-11 DIAGNOSIS — E785 Hyperlipidemia, unspecified: Secondary | ICD-10-CM

## 2015-04-11 LAB — BASIC METABOLIC PANEL
ANION GAP: 11 (ref 5–15)
BUN: <5 mg/dL — ABNORMAL LOW (ref 6–20)
CO2: 36 mmol/L — ABNORMAL HIGH (ref 22–32)
Calcium: 8.7 mg/dL — ABNORMAL LOW (ref 8.9–10.3)
Chloride: 91 mmol/L — ABNORMAL LOW (ref 101–111)
Creatinine, Ser: 0.76 mg/dL (ref 0.44–1.00)
GLUCOSE: 235 mg/dL — AB (ref 65–99)
POTASSIUM: 4.4 mmol/L (ref 3.5–5.1)
Sodium: 138 mmol/L (ref 135–145)

## 2015-04-11 LAB — CBC
HEMATOCRIT: 43.3 % (ref 36.0–46.0)
Hemoglobin: 13.2 g/dL (ref 12.0–15.0)
MCH: 30.2 pg (ref 26.0–34.0)
MCHC: 30.5 g/dL (ref 30.0–36.0)
MCV: 99.1 fL (ref 78.0–100.0)
PLATELETS: 175 10*3/uL (ref 150–400)
RBC: 4.37 MIL/uL (ref 3.87–5.11)
RDW: 14 % (ref 11.5–15.5)
WBC: 6.8 10*3/uL (ref 4.0–10.5)

## 2015-04-11 MED ORDER — IPRATROPIUM-ALBUTEROL 0.5-2.5 (3) MG/3ML IN SOLN
3.0000 mL | Freq: Four times a day (QID) | RESPIRATORY_TRACT | Status: DC
  Administered 2015-04-12 – 2015-04-13 (×7): 3 mL via RESPIRATORY_TRACT
  Filled 2015-04-11 (×7): qty 3

## 2015-04-11 MED ORDER — FUROSEMIDE 10 MG/ML IJ SOLN
40.0000 mg | Freq: Two times a day (BID) | INTRAMUSCULAR | Status: DC
  Administered 2015-04-11 – 2015-04-13 (×4): 40 mg via INTRAVENOUS
  Filled 2015-04-11 (×4): qty 4

## 2015-04-11 MED ORDER — GUAIFENESIN-CODEINE 100-10 MG/5ML PO SOLN
5.0000 mL | ORAL | Status: DC | PRN
  Administered 2015-04-11 – 2015-04-12 (×6): 5 mL via ORAL
  Filled 2015-04-11 (×6): qty 5

## 2015-04-11 MED ORDER — DM-GUAIFENESIN ER 30-600 MG PO TB12: 1.0000 | ORAL_TABLET | Freq: Two times a day (BID) | ORAL | Status: DC | PRN

## 2015-04-11 NOTE — Evaluation (Signed)
Physical Therapy Evaluation Patient Details Name: [name] MRN: [id number] DOB: [birth year] Today's Date: 04/11/2015   History of Present Illness  60 y.o. female admitted with Acute Exacerbation for Severe COPD. Hx of history of stage 2 NSCLC, pulmonary HTN, CAD, and CHF  Clinical Impression  Pt admitted with above diagnosis. Pt currently with functional limitations due to the deficits listed below (see PT Problem List). SpO2 87% on 6L supplemental O2 with gait, improves to 90% with 8L, seating and resting at 90% on 5L. Tolerated 115 feet of gait with standing rest break. Educated on monitoring of symptoms, pursed lip breathing, and energy conservation techniques. Has plenty of support at home. Is concerned about getting rides to her follow-up oncology appointments. Pt will benefit from skilled PT to increase their independence and safety with mobility to allow discharge to the venue listed below.       Follow Up Recommendations Other (comment) (Cardiopulmonary Rehab)    Equipment Recommendations  None recommended by PT    Recommendations for Other Services       Precautions / Restrictions Precautions Precautions: None Precaution Comments: Monitor O2 Restrictions Weight Bearing Restrictions: No      Mobility  Bed Mobility               General bed mobility comments: sitting EOB  Transfers Overall transfer level: Needs assistance Equipment used: Rolling walker (2 wheeled) Transfers: Sit to/from Stand Sit to Stand: Supervision         General transfer comment: supervision for safety. VC for hand palacement. Good stability with support from RW.  Ambulation/Gait Ambulation/Gait assistance: Supervision Ambulation Distance (Feet): 115 Feet Assistive device: Rolling walker (2 wheeled) Gait Pattern/deviations: Step-through pattern;Decreased stride length;Trunk flexed Gait velocity: decreased Gait velocity interpretation: Below normal speed for age/gender General  Gait Details: VC for upright posture and walker placement for proximity. No buckling noted. VC for pursed lip breathing. 3/4 dyspnea. SpO2 87% on 6L, 90% on 8L. Required one standing rest break to complete full distance. Very fatigued towards end of bout. no overt loss of balance noted. Cues for pursed lip breathing technique, monitoring of symptoms, and energy conservation techniques.  Stairs            Wheelchair Mobility    Modified Rankin (Stroke Patients Only)       Balance Overall balance assessment: Needs assistance Sitting-balance support: No upper extremity supported;Feet supported Sitting balance-Leahy Scale: Normal     Standing balance support: No upper extremity supported Standing balance-Leahy Scale: Fair                               Pertinent Vitals/Pain Pain Assessment: No/denies pain    Home Living Family/patient expects to be discharged to:: Private residence Living Arrangements: Children;Other relatives Available Help at Discharge: Family;Available 24 hours/day Type of Home: House Home Access: Stairs to enter Entrance Stairs-Rails: None Entrance Stairs-Number of Steps: 3 Home Layout: One level Home Equipment: Walker - 2 wheels;Cane - single point;Shower seat      Prior Function Level of Independence: Needs assistance   Gait / Transfers Assistance Needed: furniture walks in the house. Needs assist on steps, uses RW out of house but rarely goes out.  ADL's / Homemaking Assistance Needed: Independent        Hand Dominance   Dominant Hand: Right    Extremity/Trunk Assessment   Upper Extremity Assessment: Defer to OT evaluation  Lower Extremity Assessment: Generalized weakness (BIL LE edema)         Communication      Cognition Arousal/Alertness: Awake/alert Behavior During Therapy: WFL for tasks assessed/performed Overall Cognitive Status: Within Functional Limits for tasks assessed                       General Comments General comments (skin integrity, edema, etc.): BIL LE edema    Exercises        Assessment/Plan    PT Assessment Patient needs continued PT services  PT Diagnosis Difficulty walking;Abnormality of gait;Generalized weakness   PT Problem List Decreased strength;Decreased activity tolerance;Decreased mobility;Decreased balance;Decreased knowledge of use of DME;Cardiopulmonary status limiting activity;Obesity  PT Treatment Interventions DME instruction;Gait training;Stair training;Functional mobility training;Therapeutic activities;Therapeutic exercise;Balance training;Patient/family education   PT Goals (Current goals can be found in the Care Plan section) Acute Rehab PT Goals Patient Stated Goal: Breath better PT Goal Formulation: With patient Time For Goal Achievement: 04/25/15 Potential to Achieve Goals: Good    Frequency Min 3X/week   Barriers to discharge        Co-evaluation               End of Session Equipment Utilized During Treatment: Oxygen Activity Tolerance: Patient tolerated treatment well Patient left: in bed;with call bell/phone within reach Nurse Communication: Mobility status (SpO2)         Time: 2174-7159 PT Time Calculation (min) (ACUTE ONLY): 23 min   Charges:   PT Evaluation $PT Eval Moderate Complexity: 1 Procedure PT Treatments $Gait Training: 8-22 mins   PT G CodesEllouise Newer 04/11/2015, 10:40 AM  Elayne Snare, [place]

## 2015-04-11 NOTE — Progress Notes (Signed)
Pt given heart failure and COPD booklet

## 2015-04-11 NOTE — Progress Notes (Signed)
Subjective: Patient seen and examined this morning on rounds.  No acute events since admission.  Patient reports feeling "fair" this morning but is overall better than when she came in.  She is diuresing well, breathing is okay on her home oxygen level.  Biggest complaint this AM is her cough.  Objective: Vital signs in last 24 hours: Filed Vitals:   04/11/15 0001 04/11/15 0330 04/11/15 0404 04/11/15 0756  BP: 107/65  120/63   Pulse: 112  97   Temp:   98 F (36.7 C)   TempSrc:   Oral   Resp: 18  18   Height:      Weight:   242 lb 3.2 oz (109.861 kg)   SpO2: 92% 93% 94% 94%   Weight change:   Intake/Output Summary (Last 24 hours) at 04/11/15 0901 Last data filed at 04/11/15 0807  Gross per 24 hour  Intake    240 ml  Output   5200 ml  Net  -4960 ml   General: sitting up in bed, on supplemental oxygen via [place], not in distress HEENT: EOMI, no scleral icterus Cardiac: RRR, no rubs, murmurs or gallops Pulm: no obvious increased work of breathing, breath sounds are better today with coarse breath sounds still present throughout and expiratory wheezes Abd: soft, nontender, nondistended Ext: warm and well perfused, 2+ bilateral lower extremity edema with signs of stasis dermatitis Neuro: alert and oriented X3, no focal deficits  Lab Results: Basic Metabolic Panel:  Recent Labs Lab 04/10/15 1808 04/11/15 0339  NA 140 138  K 4.2 4.4  CL 96* 91*  CO2 37* 36*  GLUCOSE 131* 235*  BUN 5* <5*  CREATININE 0.72 0.76  CALCIUM 8.9 8.7*  MG 1.8  --    Liver Function Tests:  Recent Labs Lab 04/10/15 1808  AST 29  ALT 39  ALKPHOS 186*  BILITOT 0.5  PROT 6.1*  ALBUMIN 2.8*   No results for input(s): LIPASE, AMYLASE in the last 168 hours. No results for input(s): AMMONIA in the last 168 hours. CBC:  Recent Labs Lab 04/10/15 1808 04/11/15 0339  WBC 7.9 6.8  NEUTROABS 6.0  --   HGB 12.6 13.2  HCT 41.2 43.3  MCV 98.8 99.1  PLT 164 175   Cardiac Enzymes: No  results for input(s): CKTOTAL, CKMB, CKMBINDEX, TROPONINI in the last 168 hours. BNP: No results for input(s): PROBNP in the last 168 hours. D-Dimer: No results for input(s): DDIMER in the last 168 hours. CBG: No results for input(s): GLUCAP in the last 168 hours. Hemoglobin A1C: No results for input(s): HGBA1C in the last 168 hours. Fasting Lipid Panel: No results for input(s): CHOL, HDL, LDLCALC, TRIG, CHOLHDL, LDLDIRECT in the last 168 hours. Thyroid Function Tests: No results for input(s): TSH, T4TOTAL, FREET4, T3FREE, THYROIDAB in the last 168 hours. Coagulation: No results for input(s): LABPROT, INR in the last 168 hours. Anemia Panel: No results for input(s): VITAMINB12, FOLATE, FERRITIN, TIBC, IRON, RETICCTPCT in the last 168 hours. Urine Drug Screen: Drugs of Abuse     Component Value Date/Time   LABOPIA NONE DETECTED 07/01/2013 1218   LABOPIA NEGATIVE 02/04/2009 2315   COCAINSCRNUR NONE DETECTED 07/01/2013 1218   COCAINSCRNUR NEGATIVE 02/04/2009 2315   LABBENZ NONE DETECTED 07/01/2013 1218   LABBENZ NEGATIVE 02/04/2009 2315   AMPHETMU NONE DETECTED 07/01/2013 1218   AMPHETMU NEGATIVE 02/04/2009 2315   THCU NONE DETECTED 07/01/2013 1218   LABBARB NONE DETECTED 07/01/2013 1218    Alcohol Level: No results  for input(s): ETH in the last 168 hours. Urinalysis:  Recent Labs Lab 04/10/15 1858  COLORURINE YELLOW  LABSPEC 1.008  PHURINE 5.5  GLUCOSEU NEGATIVE  HGBUR NEGATIVE  BILIRUBINUR NEGATIVE  KETONESUR NEGATIVE  PROTEINUR NEGATIVE  NITRITE NEGATIVE  LEUKOCYTESUR NEGATIVE    Micro Results: No results found for this or any previous visit (from the past 240 hour(s)). Studies/Results: Dg Chest 2 View  04/10/2015  CLINICAL DATA:  Exacerbation of COPD. EXAM: CHEST  2 VIEW COMPARISON:  03/15/2015 FINDINGS: Dz since prior exam, the patchy airspace consolidation in the left lower lobe has become more focal bordering on the oblique fissure. Interstitial thickening  noted more diffusely in the prior study has improved. Lungs remain hyperexpanded. No pleural effusion or pneumothorax. Cardiac silhouette is mildly enlarged. No convincing mediastinal or hilar masses or adenopathy. There are multiple surgical vascular clips in the epigastric region. Bony thorax is grossly intact. IMPRESSION: 1. Improved overall lung aeration when compared to the prior chest radiograph with breast interstitial thickening. This suggests improved interstitial edema. 2. There is persistent left lower lobe opacity has become more consolidated since the prior CT. Some of this may be due to atelectasis. 3. There are no new areas of lung opacity. Electronically Signed   By: Lajean Manes M.D.   On: 04/10/2015 19:21   Medications:  Scheduled Meds: . aspirin  81 mg Oral QHS  . azithromycin  250 mg Oral Daily  . enoxaparin (LOVENOX) injection  40 mg Subcutaneous Q24H  . furosemide  40 mg Intravenous BID  . ipratropium-albuterol  3 mL Nebulization Q4H  . pantoprazole  40 mg Oral Daily  . pravastatin  40 mg Oral q1800  . predniSONE  40 mg Oral Q breakfast  . sodium chloride  3 mL Intravenous Q12H   Continuous Infusions:  PRN Meds:.acetaminophen **OR** acetaminophen, albuterol, guaiFENesin-codeine Assessment/Plan: Principal Problem:   COPD exacerbation (HCC) Active Problems:   Tobacco abuse   Coronary atherosclerosis   Pulmonary hypertension (HCC)   Severe chronic obstructive pulmonary disease (HCC)   Sleep apnea   Primary adenocarcinoma of left lung (HCC)   Major depression ([place])   Dysuria   Acute on chronic diastolic heart failure ([place])  60 y.o. woman with past medical history of stage 2 NSCLC (completed radiation therapy on 02/25/2015), COPD on home oxygen requirement of 4L via  (FEV1 42%, FEV1/FVC 88% on 12/02/2014, pulmonary hypertension, CAD status post DES 2010, congestive heart failure with EF 50-55%, BPPV and vitamin B12 deficiency who was admitted directly from [facility] on  04/10/2015 for SOB and increased leg swelling.  Acute Exacerbation for Severe COPD: based on PFTs from August 2016, Ms. Slutsky has GOLD stage 3D COPD who presened from clinic with increased SOB on exertion, worsening cough with increased sputum production. She also reports other sick contacts and upper respiratory symptoms... This morning her lungs sound better, she is maintaining oxygen saturation at her home oxygen level, and overall is better but still has a cough that is bothering her. - CBC with no leukocytosis, afebrile on vitals  - chest x-ray is improved compared to prior studies with no new areas of lung opacity - ABG yesterday did show pH 7.31 with pCO2 69, which is suggests a chronicity to her acidosis and increased pCO2 - continue prednisone 15m daily x 5 days (1/6-1/10) - albuterol nebs q6h prn - Duonebs q4h - received Azithromycin 5069myesterday, continue 25023m 4 more days - Guaifenesin-Codeine 100-69m50mL 5mL 75m as needed  - supplemental  oxygen to keep O2 saturation 92%. - smoking cessation  HFpEF exacerbation: Grade 1 diastolic dysfunction on Echo from Feb 2016. Now presenting with 10 lb weight gain and 2+ LE edema to knee. Exacerbation likely 2/2 medication noncompliance, with patient not taking her Lasix... This morning she has put out net almost 5L of urine.  Her weight is down 5 pounds today - BNP elevated to 414 (was as high as 996 last month) - daily weights, I/O's - decrease Lasix 28m IV from TID to BID  Dysuria: reports 3 days of burning with urination - urinalysis with no evidence of UTI  History of Stage 2a NSCLC: status post radiation therapy completed 11/23  - Followed by Dr. WPablo Ledger([facility])  GERD - continue Protonix 40 mg daily  HLD: on Lovastatin at home - continue Pravastatin  FEN: Fluids: none Electrolytes: magnesium normal, BMET with normal potassium and sodium.  Continue to monitor Nutrition: Heart healthy  DVT  PPx: Lovenox  CODE: Full  Dispo: Disposition is deferred at this time, awaiting improvement of current medical problems.  Possible d/c tommorow.  The patient does have a current PCP (Oval Linsey MD) and [facility].  The patient does have transportation limitations that hinder transportation to clinic appointments.  .Services Needed at time of discharge: Y = Yes, Blank = No PT:   OT:   RN:   Equipment:   Other:     LOS: 1 day   AJule Ser DO 04/11/2015, 9:01 AM

## 2015-04-11 NOTE — Progress Notes (Signed)
Patient seen and examined. Case d/w residents in detail.   HPI: In brief, 60 y/o female with PMH of NSCLC s/p RT, COPD on home O2, pulm HTN, CAD s/p DES, chronic diastolic HF, BPPV who p/w worsening cough * 2-3 days. Patient states that she has not been compliant with her lasix at home over the past week and has noted progressive LE edema. Over the last 23 days she developed worsening cough productive of greenish phlegm (usually clear) and worsening SOB/DOE. She has noted that she has been using her albuterol inhaler more often (approx 6 times/day) with no relief. She is noted to have a 10 lb weight gain since her last admission. No CP, no palpitations, no diaphoresis, no n/v, no abd pain, no syncope.   Physical Exam: Gen: AAO*3, NAD CVS: RRR, normal heart sounds Lungs: b/l coarse breath sounds with scattered b/l exp wheeze + Abd: soft, non tender, BS + Ext: 2 + b/l LE pitting edema  Assessment and Plan: 60 y/o female with worsening cough, LE edema likely secondary to acute HFpEF exacerbation with likely acute COPD exacerbation. CXR is improved compared to prior studies. She has been diuresed significantly and is negative approx 4 L over the last 24 hours. Will decrease lasix to 40 mg bid. Strict I's and O's and daily weights.  C/w nebs q 6 hrs and PO prednisone for a 5 day course. Will also complete 5 day course of azithromycin for acute COPD exacerbation.  Will likely d/c home in AM if continues to improve

## 2015-04-11 NOTE — Progress Notes (Signed)
Pt a/o, no c/o pain, pt on 5L O2, pt resting comfortably, VSS, pt stable

## 2015-04-12 ENCOUNTER — Inpatient Hospital Stay (HOSPITAL_COMMUNITY): Payer: Commercial Managed Care - HMO

## 2015-04-12 LAB — BASIC METABOLIC PANEL
Anion gap: 9 (ref 5–15)
BUN: 13 mg/dL (ref 6–20)
CALCIUM: 8.6 mg/dL — AB (ref 8.9–10.3)
CO2: 42 mmol/L — AB (ref 22–32)
CREATININE: 0.79 mg/dL (ref 0.44–1.00)
Chloride: 84 mmol/L — ABNORMAL LOW (ref 101–111)
GFR calc non Af Amer: >60 mL/min (ref >60)
GLUCOSE: 211 mg/dL — AB (ref 65–99)
Potassium: 3.5 mmol/L (ref 3.5–5.1)
Sodium: 135 mmol/L (ref 135–145)

## 2015-04-12 MED ORDER — MOMETASONE FURO-FORMOTEROL FUM 100-5 MCG/ACT IN AERO
2.0000 | INHALATION_SPRAY | Freq: Two times a day (BID) | RESPIRATORY_TRACT | Status: DC
  Administered 2015-04-12 – 2015-04-13 (×3): 2 via RESPIRATORY_TRACT
  Filled 2015-04-12: qty 8.8

## 2015-04-12 MED ORDER — BENZONATATE 100 MG PO CAPS
100.0000 mg | ORAL_CAPSULE | Freq: Two times a day (BID) | ORAL | Status: DC
  Administered 2015-04-12 – 2015-04-13 (×3): 100 mg via ORAL
  Filled 2015-04-12 (×2): qty 1

## 2015-04-12 MED ORDER — TIOTROPIUM BROMIDE MONOHYDRATE 18 MCG IN CAPS
18.0000 ug | ORAL_CAPSULE | Freq: Every day | RESPIRATORY_TRACT | Status: DC
  Administered 2015-04-12 – 2015-04-13 (×2): 18 ug via RESPIRATORY_TRACT
  Filled 2015-04-12: qty 5

## 2015-04-12 MED ORDER — ALBUTEROL SULFATE HFA 108 (90 BASE) MCG/ACT IN AERS: 2.0000 | INHALATION_SPRAY | Freq: Four times a day (QID) | RESPIRATORY_TRACT | Status: DC | PRN

## 2015-04-12 NOTE — Progress Notes (Signed)
Subjective: No acute events overnight. She reports feeling "the same" this morning. Complains of a cough.   Objective: Vital signs in last 24 hours: Filed Vitals:   04/12/15 0000 04/12/15 0255 04/12/15 0335 04/12/15 0757  BP: 103/50  122/58   Pulse: 96  109   Temp: 98.1 F (36.7 C)  98.4 F (36.9 C)   TempSrc: Oral     Resp: 20     Height:      Weight:   243 lb 3.2 oz (110.315 kg)   SpO2: 92% 89% 92% 85%   Weight change: -3 lb 15 oz (-1.785 kg)  Intake/Output Summary (Last 24 hours) at 04/12/15 0837 Last data filed at 04/11/15 2329  Gross per 24 hour  Intake    843 ml  Output   3200 ml  Net  -2357 ml   General: sitting up in bed, on supplemental oxygen via [place], NAD HEENT: [place]/AT, EOMI, no scleral icterus Cardiac: RRR, no m/g/r Pulm: increased coarse breath sounds compared to yesterday, expiratory wheezing present, on 4 L oxygen via  Abd: BS+, soft, obese, nontender, nondistended Ext: warm, 2+ bilateral lower extremity edema with signs of stasis dermatitis Neuro: alert and oriented X 3, no focal deficits  Lab Results: Basic Metabolic Panel:  Recent Labs Lab 04/10/15 1808 04/11/15 0339 04/12/15 0329  NA 140 138 135  K 4.2 4.4 3.5  CL 96* 91* 84*  CO2 37* 36* 42*  GLUCOSE 131* 235* 211*  BUN 5* <5* 13  CREATININE 0.72 0.76 0.79  CALCIUM 8.9 8.7* 8.6*  MG 1.8  --   --    Liver Function Tests:  Recent Labs Lab 04/10/15 1808  AST 29  ALT 39  ALKPHOS 186*  BILITOT 0.5  PROT 6.1*  ALBUMIN 2.8*   CBC:  Recent Labs Lab 04/10/15 1808 04/11/15 0339  WBC 7.9 6.8  NEUTROABS 6.0  --   HGB 12.6 13.2  HCT 41.2 43.3  MCV 98.8 99.1  PLT 164 175   Urinalysis:  Recent Labs Lab 04/10/15 1858  COLORURINE YELLOW  LABSPEC 1.008  PHURINE 5.5  GLUCOSEU NEGATIVE  HGBUR NEGATIVE  BILIRUBINUR NEGATIVE  KETONESUR NEGATIVE  PROTEINUR NEGATIVE  NITRITE NEGATIVE  LEUKOCYTESUR NEGATIVE    Studies/Results: Dg Chest 2 View  04/10/2015  CLINICAL DATA:   Exacerbation of COPD. EXAM: CHEST  2 VIEW COMPARISON:  03/15/2015 FINDINGS: Dz since prior exam, the patchy airspace consolidation in the left lower lobe has become more focal bordering on the oblique fissure. Interstitial thickening noted more diffusely in the prior study has improved. Lungs remain hyperexpanded. No pleural effusion or pneumothorax. Cardiac silhouette is mildly enlarged. No convincing mediastinal or hilar masses or adenopathy. There are multiple surgical vascular clips in the epigastric region. Bony thorax is grossly intact. IMPRESSION: 1. Improved overall lung aeration when compared to the prior chest radiograph with breast interstitial thickening. This suggests improved interstitial edema. 2. There is persistent left lower lobe opacity has become more consolidated since the prior CT. Some of this may be due to atelectasis. 3. There are no new areas of lung opacity. Electronically Signed   By: Lajean Manes M.D.   On: 04/10/2015 19:21   Medications:  Scheduled Meds: . aspirin  81 mg Oral QHS  . azithromycin  250 mg Oral Daily  . benzonatate  100 mg Oral BID  . enoxaparin (LOVENOX) injection  40 mg Subcutaneous Q24H  . furosemide  40 mg Intravenous BID  . ipratropium-albuterol  3 mL  Nebulization Q6H WA  . mometasone-formoterol  2 puff Inhalation BID  . pantoprazole  40 mg Oral Daily  . pravastatin  40 mg Oral q1800  . predniSONE  40 mg Oral Q breakfast  . sodium chloride  3 mL Intravenous Q12H  . tiotropium  18 mcg Inhalation Daily   Continuous Infusions:  PRN Meds:.acetaminophen **OR** acetaminophen, albuterol, guaiFENesin-codeine Assessment/Plan:  60 y.o. woman with past medical history of stage 2 NSCLC (completed radiation therapy on 02/25/2015), COPD on home oxygen requirement of 4L via [place] (FEV1 42%, FEV1/FVC 88% on 12/02/2014, pulmonary hypertension, CAD status post DES 2010, congestive heart failure with EF 50-55%, BPPV and vitamin B12 deficiency who was admitted directly  from [facility] on 04/10/2015 for SOB and increased leg swelling likely secondary to an acute COPD exacerbation and CHF exacerbation.   Acute Exacerbation for Severe COPD: GOLD stage 3D COPD based on PFTs from August 2016. Still complaining of a productive cough and breath sounds more coarse compared to yesterday. She has expiratory wheezing as well.  - Repeat CXR>> mildly improved aeration compared to CXR 1/6, no new consolidations - Start tessalon pearls BID for cough - Restart home inhalers- Advair, Spiriva, Albuterol - Continue prednisone 91m daily x 5 days (1/6-1/10) - Continue albuterol nebs q6h prn - Continue Duonebs q4h - Continue Azithromycin 2517mdaily  - Guaifenesin-Codeine 100-1070mmL 5mL31mh as needed  - Supplemental oxygen to keep O2 saturation 88-92% - Smoking cessation counseling   HFpEF exacerbation: Secondary to medication noncompliance. Grade 1 diastolic dysfunction on Echo from Feb 2016.UOP 3600 ml over last 24 hours. Weight is up 1 lb from yesterday- likely not accurate given UOP.   - Continue Lasix 40 mg IV BID - Daily weights, I/O's  History of Stage 2a NSCLC: status post radiation therapy completed 11/23  - Followed by Dr. [facility])  GERD - Continue Protonix 40 mg daily  HLD: on Lovastatin at home - Continue Pravastatin  Diet: Heart healthy DVT PPx: Lovenox SQ CODE: Full Dispo: Possible discharge today or tomorrow  The patient does have a current PCP (LawOval Linsey) and [facility].  The patient does have transportation limitations that hinder transportation to clinic appointments.  .Services Needed at time of discharge: Y = Yes, Blank = No PT:   OT:   RN:   Equipment:   Other:     LOS: 2 days   CarlJuliet Rude 04/12/2015, 8:37 AM

## 2015-04-12 NOTE — Progress Notes (Signed)
Utilization review completed

## 2015-04-12 NOTE — Progress Notes (Signed)
Pt. C/o of constant cough unrelieved by PRN cough medication. On call for Imts. Paged.

## 2015-04-13 ENCOUNTER — Other Ambulatory Visit: Payer: Self-pay | Admitting: Pharmacist

## 2015-04-13 ENCOUNTER — Other Ambulatory Visit: Payer: Commercial Managed Care - HMO

## 2015-04-13 LAB — BASIC METABOLIC PANEL
Anion gap: 10 (ref 5–15)
BUN: 13 mg/dL (ref 6–20)
CALCIUM: 9.1 mg/dL (ref 8.9–10.3)
CO2: 45 mmol/L — AB (ref 22–32)
CREATININE: 0.73 mg/dL (ref 0.44–1.00)
Chloride: 81 mmol/L — ABNORMAL LOW (ref 101–111)
GFR calc non Af Amer: >60 mL/min (ref >60)
Glucose, Bld: 162 mg/dL — ABNORMAL HIGH (ref 65–99)
Potassium: 3.8 mmol/L (ref 3.5–5.1)
Sodium: 136 mmol/L (ref 135–145)

## 2015-04-13 MED ORDER — BENZONATATE 100 MG PO CAPS: 100.0000 mg | ORAL_CAPSULE | Freq: Two times a day (BID) | ORAL | Status: DC

## 2015-04-13 MED ORDER — PREDNISONE 10 MG PO TABS: ORAL_TABLET | ORAL | Status: DC

## 2015-04-13 MED ORDER — DEXTROMETHORPHAN POLISTIREX ER 30 MG/5ML PO SUER
30.0000 mg | Freq: Two times a day (BID) | ORAL | Status: DC | PRN
  Administered 2015-04-13: 30 mg via ORAL
  Filled 2015-04-13 (×3): qty 5

## 2015-04-13 MED ORDER — DEXTROMETHORPHAN POLISTIREX ER 30 MG/5ML PO SUER: 30.0000 mg | Freq: Two times a day (BID) | ORAL | Status: DC | PRN

## 2015-04-13 MED ORDER — PREDNISONE 20 MG PO TABS: 40.0000 mg | ORAL_TABLET | Freq: Every day | ORAL | Status: DC

## 2015-04-13 MED ORDER — AZITHROMYCIN 250 MG PO TABS: 250.0000 mg | ORAL_TABLET | Freq: Every day | ORAL | Status: AC

## 2015-04-13 NOTE — Discharge Summary (Signed)
Name: [name] MRN: [id number] DOB: 1956-01-07 60 y.o. PCP: Oval Linsey, MD  Date of Admission: 04/10/2015  5:02 PM Date of Discharge: 04/13/2015 Attending Physician: No att. providers found  Discharge Diagnosis:  Principal Problem:   COPD exacerbation ([place]) Active Problems:   Tobacco abuse   Coronary atherosclerosis   Pulmonary hypertension (HCC)   Severe chronic obstructive pulmonary disease ([place])   Sleep apnea   Primary adenocarcinoma of left lung (HCC)   Major depression ([place])   Dysuria   Acute on chronic diastolic heart failure ([place])  Discharge Medications:   Medication List    STOP taking these medications        acetaminophen 500 MG tablet  Commonly known as:  TYLENOL     azithromycin 250 MG tablet  Commonly known as:  ZITHROMAX     nicotine 14 mg/24hr patch  Commonly known as:  NICODERM CQ - dosed in mg/24 hours     nicotine polacrilex 4 MG gum  Commonly known as:  NICORETTE      TAKE these medications        albuterol (2.5 MG/3ML) 0.083% nebulizer solution  Commonly known as:  PROVENTIL  Take 3 mLs (2.5 mg total) by nebulization every 6 (six) hours as needed for shortness of breath.     albuterol 108 (90 Base) MCG/ACT inhaler  Commonly known as:  VENTOLIN HFA  Inhale 2 puffs into the lungs every 6 (six) hours as needed for shortness of breath.     aspirin 81 MG chewable tablet  Chew 1 tablet (81 mg total) by mouth at bedtime.     benzonatate 100 MG capsule  Commonly known as:  TESSALON  Take 1 capsule (100 mg total) by mouth 2 (two) times daily.     dextromethorphan 30 MG/5ML liquid  Commonly known as:  DELSYM  Take 5 mLs (30 mg total) by mouth 2 (two) times daily as needed for cough.     Fluticasone-Salmeterol 250-50 MCG/DOSE Aepb  Commonly known as:  ADVAIR DISKUS  Inhale 1 puff into the lungs 2 (two) times daily.     furosemide 40 MG tablet  Commonly known as:  LASIX  Take 1 tablet (40 mg total) by mouth daily.     gabapentin  300 MG capsule  Commonly known as:  NEURONTIN  Take 1 capsule (300 mg total) by mouth 3 (three) times daily.     lovastatin 40 MG tablet  Commonly known as:  MEVACOR  Take 1 tablet (40 mg total) by mouth at bedtime.     OXYGEN  Inhale 4 L into the lungs continuous.     pantoprazole 40 MG tablet  Commonly known as:  PROTONIX  Take 1 tablet (40 mg total) by mouth daily.     potassium chloride SA 20 MEQ tablet  Commonly known as:  K-DUR,KLOR-CON  Take 2 tablets (40 mEq total) by mouth daily as needed (Take when taking lasix).     predniSONE 10 MG tablet  Commonly known as:  DELTASONE  Follow the following taper: 12m x 1 more day, then 287mx 5 days, then 1057m 5 days     tiotropium 18 MCG inhalation capsule  Commonly known as:  SPIRIVA HANDIHALER  Place 1 capsule (18 mcg total) into inhaler and inhale daily.        Disposition and follow-up:   MorganShyla B [name] St[facility].    1.  At the  hospital follow up visit please address:  - patient completion of azithromycin (end date 1/10) - patient following prednisone taper - patient compliant with Lasix (check weight and legs for swelling) - ask if she has heard about pulm rehab referral (she may not hear from them until 1 week after discharge or so) - check O2 sats on home oxygen as well as clinic oxygen to make sure her home O2 is functioning properly  2.  Labs / imaging needed at time of follow-up: BMET  3.  Pending labs/ test needing follow-up: none  Follow-up Appointments: Follow-up Information    Follow up with Osa Craver, MD On 04/16/2015.   Specialty:  Internal Medicine   Why:  appointment time at 2:45pm   Contact information:   [place] [address] [phone]       Discharge Instructions: Discharge Instructions    DME Bedside commode    Complete by:  As directed      Diet - low sodium heart healthy    Complete by:  As directed        Increase activity slowly    Complete by:  As directed            Consultations:    Procedures Performed:  Dg Chest 2 View  04/12/2015  CLINICAL DATA:  COPD exacerbation. Shortness of breath, cough, and chest pain. EXAM: CHEST  2 VIEW COMPARISON:  04/10/2015 FINDINGS: Cardiac silhouette remains enlarged. The lungs are hyperinflated. Mild diffuse interstitial prominence is unchanged. Focal rounded opacity in the left lower lobe (in the region of known lung mass demonstrated on prior CT and PET-CT) persists, with additional patchy opacities in the left lower lobe having slightly improved from yesterday's study. Streaky right basilar opacity is unchanged. No acute osseous abnormality is identified. Numerous surgical clips are noted in the epigastric region. IMPRESSION: Slightly improved aeration of the left lung base, otherwise unchanged examination. Electronically Signed   By: Logan Bores M.D.   On: 04/12/2015 10:45   Dg Chest 2 View  04/10/2015  CLINICAL DATA:  Exacerbation of COPD. EXAM: CHEST  2 VIEW COMPARISON:  03/15/2015 FINDINGS: Dz since prior exam, the patchy airspace consolidation in the left lower lobe has become more focal bordering on the oblique fissure. Interstitial thickening noted more diffusely in the prior study has improved. Lungs remain hyperexpanded. No pleural effusion or pneumothorax. Cardiac silhouette is mildly enlarged. No convincing mediastinal or hilar masses or adenopathy. There are multiple surgical vascular clips in the epigastric region. Bony thorax is grossly intact. IMPRESSION: 1. Improved overall lung aeration when compared to the prior chest radiograph with breast interstitial thickening. This suggests improved interstitial edema. 2. There is persistent left lower lobe opacity has become more consolidated since the prior CT. Some of this may be due to atelectasis. 3. There are no new areas of lung opacity. Electronically Signed   By: Lajean Manes M.D.   On:  04/10/2015 19:21    2D Echo: none  Cardiac Cath: none  Admission HPI:  [name] is a 60 y.o. woman with past medical history of stage 2 NSCLC (completed radiation therapy on 02/25/2015), COPD on home oxygen requirement of 4L via [place] (FEV1 42%, FEV1/FVC 88% on 12/02/2014, pulmonary hypertension, CAD status post DES 2010, congestive heart failure with EF 50-55%, BPPV and vitamin B12 deficiency who presents as a direct admit from the Internal Medicine Clinic this afternoon. She is complaining of a couple days worth of worsening  cough productive of green sputum and shortness of breath on exertion. She apparently has chronic sputum production that is clear. She has had increased albuterol use of 6-7 times per day without relief. She is appropriately using her Spirivia and Advair inhalers. She finished a course of prednisone and and TMP-SMX for a COPD exacerbation in December. Additionally, patient complains of approximately 1 week of Lasix non-compliance that has resulted in worsening lower extremity edema. She has about a 10 pound weight gain since her last clinic visit and reports sleeping upright. She states the reason for not taking her Lasix is because she lives in an apartment with 7 other people and has trouble waiting to urinate and has experienced occasional incontinence.  She denies chest pain, lightheadedness, abdominal pain, nausea, vomiting, diarrhea, constipation. She does have pain from coughing, subjective fever, chills, rhinorrhea, nasal congestion, sore throat, dysuria. When seen in clinic, she was saturating at 86% on her home oxygen of 4L [place]. This improved to 88-90% on clinic portable oxygen 4L [place]. Patient expressed concern that she would not be able to manage an increased Lasix dosing at home and, thus, IMTS was called to admit patient. Patient still smokes approximately 5 cigarettes per day but her history is much more extensive and she reports smoking upwards of 2  PPD's for many years.  Hospital Course by problem list: Principal Problem:   COPD exacerbation ([place]) Active Problems:   Tobacco abuse   Coronary atherosclerosis   Pulmonary hypertension (HCC)   Severe chronic obstructive pulmonary disease (HCC)   Sleep apnea   Primary adenocarcinoma of left lung (HCC)   Major depression (HCC)   Dysuria   Acute on chronic diastolic heart failure (HCC)   Acute Exacerbation for Severe COPD: GOLD stage 3D COPD based on PFTs from August 2016.  Frequent exacerbations and hospitalizations admitted on January 6 for COPD exacerbation and volume overload related to heart failure.  She received systemic steroids, frequent breathing treatments, and azithromycin.  Her respiratory function returned to near baseline by time of admission.  Given her advanced lung disease, already being oxygen dependent, and additional limited pulmonary reserve from lung cancer and radiation, we probably will be dealing with someone who is chronically hypoxic and not attempt to push her oxygen saturation to far at the expense of causing CO2 retention.  We ambulated her with nursing prior to discharge while monitoring her oxygen saturation and she maintained in the upper 80s oxygen saturation.  We discharged home on an extended prednisone taper, azithromycin for 1 more day to complete 5 day course, and with referral to pulmonary rehab.  HFpEF exacerbation: Secondary to medication noncompliance. Grade 1 diastolic dysfunction on Echo from Feb 2016.We diuresed with IV Lasix and patient was back to baseline weight down 10 pounds at discharge.  We discharged home on regular dose of Lasix as patient was back to baseline weight.  Bedside commode was ordered at discharge for patient due to frequent urination on Lasix and inability to get to bathroom due to living conditions.  Discharge Vitals:   BP 114/68 mmHg  Pulse 101  Temp(Src) 98.7 F (37.1 C) (Oral)  Resp 20  Ht _0  (1.651 m)  Wt 241 lb 8  oz (109.544 kg)  BMI 40.19 kg/m2  SpO2 85%  Discharge Labs:  No results found for this or any previous visit (from the past 24 hour(s)).  Signed: Jule Ser, DO 04/15/2015, 5:00 PM    Services Ordered on Discharge: pulmonary rehab Equipment Ordered  on Discharge: bedside commode

## 2015-04-13 NOTE — Progress Notes (Signed)
PT Cancellation Note  Patient Details Name: [name] MRN: [id number] DOB: [birth year]   Cancelled Treatment:    Reason Eval/Treat Not Completed: Other (comment) (refused due to going home)   Denice Paradise 04/13/2015, 4:07 PM  Ysabel Cowgill,PT Acute Rehabilitation [phone] (pager)

## 2015-04-13 NOTE — Progress Notes (Signed)
SATURATION QUALIFICATIONS: (This note is used to comply with regulatory documentation for home oxygen)  Patient Saturations on Room Air at Rest =NA  Patient Saturations on Room Air while Ambulating = NA  Patient Saturations on 4 Liters of oxygen while Ambulating = 86%  Please briefly explain why patient needs home oxygen:

## 2015-04-13 NOTE — Progress Notes (Signed)
Internal Medicine Attending:   I saw and examined the patient. I reviewed the resident's note and I agree with the resident's findings and plan as documented in the resident's note.  60 year old woman with severe COPD and frequent exacerbations admitted on 1/6 with acute hypoxic respiratory failure due to COPD exacerbation and probable volume overload related to heart failure. She has diuresed well with IV Lasix, now back to her usual weight. Respiratory function appears to be nearing her baseline. Still having a lot of nonproductive cough. Lungs have distant sounds but inspiratory low pitched wheezing at the bases. Plan to continue prednisone and azithromycin along with frequent breathing treatments while in-house. Would have the patient work with physical therapy today, ambulate and check oxygen saturations. She may be ready for to discharge home today, will need follow-up with pulmonary rehabilitation.

## 2015-04-13 NOTE — Progress Notes (Signed)
Internal Medicine Clinic Attending  Case discussed with Dr. Lovena Le at the time of the visit.  We reviewed the resident's history and exam and pertinent patient test results.  I agree with the assessment, diagnosis, and plan of care documented in the resident's note.

## 2015-04-13 NOTE — Patient Outreach (Signed)
[facility]) Care Management  [place]   04/13/2015  [name] May 14, 1955 [id number]  Subjective: [name] is a 60 y.o. female who was referred to [place] for smoking cessation.   [facility] is no longer providing smoking cessation services. I tried to call the patient to provide other resources but I was unable to reach her.  I called the patient to discuss these resources and I had to leave a HIPAA compliant message for patient to return my phone call.  I will reach out on 04/17/15 if the patient does not return my call today.  Nicoletta Ba, PharmD, [facility] [phone]

## 2015-04-13 NOTE — Care Management Important Message (Signed)
Important Message  Patient Details  Name: [name] MRN: [id number] Date of Birth: 09-Jun-1955   Medicare Important Message Given:  Yes    Louanne Belton 04/13/2015, 1:14 PMImportant Message  Patient Details  Name: [name] MRN: [id number] Date of Birth: 1955-09-20   Medicare Important Message Given:  Yes    Neidy Guerrieri G 04/13/2015, 1:14 PM

## 2015-04-13 NOTE — Consult Note (Signed)
   [facility] CM Inpatient Consult   04/13/2015  [name] 16-Jul-1955 [id number] Patient is currently active [up to admission] with [facility] for chronic disease management services for COPD exacerbation, HF.  Patient has been engaged by a [employer].   Patient will receive a post discharge transition of care call and will be evaluated for monthly home visits for assessments and disease process education.  Made Inpatient Case Manager, Judson Roch,  aware that [facility] involvement. Of note, [facility] Care Management services does not replace or interfere with any services that are needed or arranged by inpatient case management or social work.  For additional questions or referrals please contact: Natividad Brood, RN BSN [facility] Liaison  [phone] business mobile phone

## 2015-04-13 NOTE — Discharge Instructions (Signed)
Dear [name],  I have sent in a prescription to your pharmacy for 4 medications: 1.  Azithromycin 269m daily for 1 more day 2.  Prednisone taper as directed 3.  Cough medicine to take as needed 4.  Tessalon pearls to take as needed for your cough  I have sent a message to our social worker in clinic (Golden Hurter about your transportation needs in order to get to your appointments.    I have placed a referral for you to be seen by pulmonary rehab and they will be contacting you next week probably.

## 2015-04-13 NOTE — Progress Notes (Signed)
Pt resting o2 sats 89% on 4 liters of o2.  MD ordered for pt to ambulate.  Pt o2 sats dropped to 86% on 4 liters and pt extremely winded after walking 100 ft.  Pt back in room sitting on the side of bed, coughing.  Will continue to monitor.

## 2015-04-13 NOTE — Progress Notes (Signed)
Subjective: Patient seen and examined this morning on rounds.  No acute events overnight, but still complaining of cough that is not any better.  As needed cough syrup was changed from Robitussin AC to Delsym today.  Will ask nursing to ambulate patient while monitoring oxygen saturations on her home oxygen.  Objective: Vital signs in last 24 hours: Filed Vitals:   04/12/15 2038 04/13/15 0259 04/13/15 0445 04/13/15 0716  BP: 102/50  106/59   Pulse: 108  102   Temp: 98.6 F (37 C)  98.4 F (36.9 C)   TempSrc: Oral  Oral   Resp: 18  20   Height:      Weight:   241 lb 8 oz (109.544 kg)   SpO2: 93% 95% 94% 86%   Weight change: -1 lb 11.2 oz (-0.771 kg)  Intake/Output Summary (Last 24 hours) at 04/13/15 0946 Last data filed at 04/13/15 3491  Gross per 24 hour  Intake    823 ml  Output   3625 ml  Net  -2802 ml   General: sitting up in bed, on supplemental oxygen via [place], no acute distress, coughing intermittently HEENT: normocephalic/atraumatic, EOMI, no scleral icterus Cardiac: RRR, no murmurs Pulm: coarse breath sounds, expiratory wheezing present, on 4 L oxygen via [place] Ext: warm, 2+ bilateral lower extremity edema with signs of stasis dermatitis Neuro: alert and oriented X 3, no focal deficits  Lab Results: Basic Metabolic Panel:  Recent Labs Lab 04/10/15 1808  04/12/15 0329 04/13/15 0535  NA 140  < > 135 136  K 4.2  < > 3.5 3.8  CL 96*  < > 84* 81*  CO2 37*  < > 42* 45*  GLUCOSE 131*  < > 211* 162*  BUN 5*  < > 13 13  CREATININE 0.72  < > 0.79 0.73  CALCIUM 8.9  < > 8.6* 9.1  MG 1.8  --   --   --   < > = values in this interval not displayed. Liver Function Tests:  Recent Labs Lab 04/10/15 1808  AST 29  ALT 39  ALKPHOS 186*  BILITOT 0.5  PROT 6.1*  ALBUMIN 2.8*   CBC:  Recent Labs Lab 04/10/15 1808 04/11/15 0339  WBC 7.9 6.8  NEUTROABS 6.0  --   HGB 12.6 13.2  HCT 41.2 43.3  MCV 98.8 99.1  PLT 164 175   Urinalysis:  Recent Labs Lab  04/10/15 1858  COLORURINE YELLOW  LABSPEC 1.008  PHURINE 5.5  GLUCOSEU NEGATIVE  HGBUR NEGATIVE  BILIRUBINUR NEGATIVE  KETONESUR NEGATIVE  PROTEINUR NEGATIVE  NITRITE NEGATIVE  LEUKOCYTESUR NEGATIVE    Studies/Results: Dg Chest 2 View  04/12/2015  CLINICAL DATA:  COPD exacerbation. Shortness of breath, cough, and chest pain. EXAM: CHEST  2 VIEW COMPARISON:  04/10/2015 FINDINGS: Cardiac silhouette remains enlarged. The lungs are hyperinflated. Mild diffuse interstitial prominence is unchanged. Focal rounded opacity in the left lower lobe (in the region of known lung mass demonstrated on prior CT and PET-CT) persists, with additional patchy opacities in the left lower lobe having slightly improved from yesterday's study. Streaky right basilar opacity is unchanged. No acute osseous abnormality is identified. Numerous surgical clips are noted in the epigastric region. IMPRESSION: Slightly improved aeration of the left lung base, otherwise unchanged examination. Electronically Signed   By: Logan Bores M.D.   On: 04/12/2015 10:45   Medications:  Scheduled Meds: . aspirin  81 mg Oral QHS  . azithromycin  250 mg Oral Daily  .  benzonatate  100 mg Oral BID  . enoxaparin (LOVENOX) injection  40 mg Subcutaneous Q24H  . furosemide  40 mg Intravenous BID  . ipratropium-albuterol  3 mL Nebulization Q6H WA  . mometasone-formoterol  2 puff Inhalation BID  . pantoprazole  40 mg Oral Daily  . pravastatin  40 mg Oral q1800  . predniSONE  40 mg Oral Q breakfast  . sodium chloride  3 mL Intravenous Q12H  . tiotropium  18 mcg Inhalation Daily   Continuous Infusions:  PRN Meds:.acetaminophen **OR** acetaminophen, albuterol, dextromethorphan Assessment/Plan:  60 y.o. woman with past medical history of stage 2 NSCLC (completed radiation therapy on 02/25/2015), COPD on home oxygen requirement of 4L via [place] (FEV1 42%, FEV1/FVC 88% on 12/02/2014, pulmonary hypertension, CAD status post DES 2010, congestive  heart failure with EF 50-55%, BPPV and vitamin B12 deficiency who was admitted directly from [facility] on 04/10/2015 for SOB and increased leg swelling likely secondary to an acute COPD exacerbation and CHF exacerbation.   Acute Exacerbation for Severe COPD: GOLD stage 3D COPD based on PFTs from August 2016. Still complaining of a cough that is not any better  - Continue tessalon pearls BID for cough - Restarted home inhalers yesterday - Advair, Spiriva, Albuterol - Continue prednisone 63m daily x 5 days (1/6-1/10) - Continue albuterol nebs q6h prn - Continue Duonebs q4h - Continue Azithromycin 2568mdaily (1/6-1/10)  - Dextromethorphan 3032mmL BID as needed - Supplemental oxygen to keep O2 saturation 88-92% - Smoking cessation counseling  - I have asked nursing to ambulate patient while on her home oxygen of 4L [place].  Given her advanced lung disease, already being oxygen dependent, and additional limited pulmonary reserve from lung cancer and radiation, we probably will be dealing with someone who is chronically hypoxic and not attempt to push her oxygen saturation to far at the expense of causing CO2 retention - Will look into referral to Cardio-Pulmonary Rehab  HFpEF exacerbation: Secondary to medication noncompliance. Grade 1 diastolic dysfunction on Echo from Feb 2016.Net UOP -2802 ml over last 24 hours. Weight is down to 241 pounds today down almost 10 pounds from admission   - Discharge on home Lasix  History of Stage 2a NSCLC: status post radiation therapy completed 11/23  - [facility]  GERD - Continue Protonix 40 mg daily  HLD: on Lovastatin at home - Continue Pravastatin  Diet: Heart healthy DVT PPx: Lovenox SQ CODE: Full Dispo: [facility] follow [facility].  The patient does have a current PCP (LaOval LinseyD) and [facility]  [facility].  The patient does have transportation limitations that hinder transportation to clinic appointments.  .Services Needed at time of discharge: Y = Yes, Blank = No PT:   OT:   RN:   Equipment:   Other:  pulm rehab    LOS: 3 days   AndJule SerO 04/13/2015, 9:46 AM

## 2015-04-14 ENCOUNTER — Other Ambulatory Visit: Payer: Self-pay | Admitting: *Deleted

## 2015-04-14 ENCOUNTER — Telehealth: Payer: Self-pay | Admitting: *Deleted

## 2015-04-14 ENCOUNTER — Telehealth: Payer: Self-pay | Admitting: Internal Medicine

## 2015-04-14 NOTE — Telephone Encounter (Signed)
Called and left a message with lab appt 04/17/15

## 2015-04-14 NOTE — Telephone Encounter (Signed)
"  I was hospitalized and missed yesterday's appointment.  Please reschedule lab."

## 2015-04-14 NOTE — Patient Outreach (Signed)
First attempt made to contact pt for transition of care (discharged 1/9).  HIPPA compliant voice message left with contact number.   If no response, will try again.     Zara Chess.   [facility]  [phone]

## 2015-04-15 ENCOUNTER — Ambulatory Visit: Payer: Commercial Managed Care - HMO | Admitting: *Deleted

## 2015-04-16 ENCOUNTER — Encounter: Payer: Self-pay | Admitting: Internal Medicine

## 2015-04-16 ENCOUNTER — Other Ambulatory Visit: Payer: Self-pay | Admitting: *Deleted

## 2015-04-16 ENCOUNTER — Ambulatory Visit (INDEPENDENT_AMBULATORY_CARE_PROVIDER_SITE_OTHER): Payer: Commercial Managed Care - HMO | Admitting: Internal Medicine

## 2015-04-16 VITALS — BP 110/64 | HR 98 | Temp 98.5°F | Wt 243.0 lb

## 2015-04-16 DIAGNOSIS — Z7951 Long term (current) use of inhaled steroids: Secondary | ICD-10-CM | POA: Diagnosis not present

## 2015-04-16 DIAGNOSIS — Z9981 Dependence on supplemental oxygen: Secondary | ICD-10-CM | POA: Diagnosis not present

## 2015-04-16 DIAGNOSIS — I5032 Chronic diastolic (congestive) heart failure: Secondary | ICD-10-CM

## 2015-04-16 DIAGNOSIS — J449 Chronic obstructive pulmonary disease, unspecified: Secondary | ICD-10-CM | POA: Diagnosis not present

## 2015-04-16 MED ORDER — GUAIFENESIN ER 600 MG PO TB12: 600.0000 mg | ORAL_TABLET | Freq: Two times a day (BID) | ORAL | Status: DC

## 2015-04-16 NOTE — Addendum Note (Signed)
Addended by: Vickii Chafe on: 04/16/2015 04:57 PM   Modules accepted: Orders

## 2015-04-16 NOTE — Progress Notes (Signed)
Subjective:    Patient ID: [name], female    DOB: 1955/10/06, 60 y.o.   MRN: [id number]  HPI [name] is a 60 y.o. female with PMHx of severe COPD and HFpEF, tobacco abuse and lung cancer who presents to the clinic for follow up for a recent COPD exacerbation. Please see A&P for the status of the patient's chronic medical problems.   Past Medical History  Diagnosis Date  . Coronary artery disease     S/P PCI of LAD with DES (12/2008). Total occlusion of RCA noted at that time., medically managed. ACS ruled out 03/2009 with Lexiscan myoview . Followed by [facility].  . Pulmonary hypertension ([facility])     2-D Echo (21/1941) - Systolic pressure was moderately increased. PA peak pressure  62mHg. 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 Followed by Dr. WJoya Gaskins([facility]  . Diastolic dysfunction     2-D Echo (12/2008) - Normal LV Systolic funciton with EF 60-65%. Grade 1 diastolid dysfunction. No regional wall motion abnormalities. Moderate pulmonary HTN with PA peak pressure 523mg.  . Marland KitchenOPD (chronic obstructive pulmonary disease) (HCC)     Severe. Gold Stage IV.  PFTs (12/2008) - severe obstructive airway disease. Active tobacco use. Requires 4L O2 at home.  . Pulmonary nodule, right     Small right middle lobe nodule. Stable as of 12/2008.  . Marland Kitchenrediabetes     HgbA1c 6.4 (12/2008)  . Hx MRSA infection     Recurrent MRSA thigh abscesses.  . Obesity   . Hyperlipidemia   . GERD (gastroesophageal reflux disease)     S/P Nissen fundoplication.  . CHF (congestive heart failure) ([facility]  . On home oxygen therapy since 2010    4L all the time  . Shortness of breath dyspnea     with a lot of exertion; if fluid builds up  . Pneumonia   . Depression   . Full dentures   . Chronic bronchitis ([facility]    "get it most years" (03/03/2015)  . Lung cancer ([facility]    "left"  . History of hiatal  hernia   . Arthritis     "some scattered" (03/03/2015)    Outpatient Encounter Prescriptions as of 04/16/2015  Medication Sig Note  . albuterol (PROVENTIL) (2.5 MG/3ML) 0.083% nebulizer solution Take 3 mLs (2.5 mg total) by nebulization every 6 (six) hours as needed for shortness of breath.   . Marland Kitchenlbuterol (VENTOLIN HFA) 108 (90 BASE) MCG/ACT inhaler Inhale 2 puffs into the lungs every 6 (six) hours as needed for shortness of breath.   . Marland Kitchenspirin 81 MG chewable tablet Chew 1 tablet (81 mg total) by mouth at bedtime.   . benzonatate (TESSALON) 100 MG capsule Take 1 capsule (100 mg total) by mouth 2 (two) times daily.   . Marland Kitchenextromethorphan (DELSYM) 30 MG/5ML liquid Take 5 mLs (30 mg total) by mouth 2 (two) times daily as needed for cough.   . Fluticasone-Salmeterol (ADVAIR DISKUS) 250-50 MCG/DOSE AEPB Inhale 1 puff into the lungs 2 (two) times daily.   . furosemide (LASIX) 40 MG tablet Take 1 tablet (40 mg total) by mouth daily.   . Marland Kitchenabapentin (NEURONTIN) 300 MG capsule Take 1 capsule (300 mg total) by mouth 3 (three) times daily. (Patient taking differently: Take 300 mg by mouth at bedtime. )   . lovastatin (MEVACOR) 40 MG tablet Take 1 tablet (40  mg total) by mouth at bedtime.   . OXYGEN Inhale 4 L into the lungs continuous.    . pantoprazole (PROTONIX) 40 MG tablet Take 1 tablet (40 mg total) by mouth daily.   . potassium chloride SA (K-DUR,KLOR-CON) 20 MEQ tablet Take 2 tablets (40 mEq total) by mouth daily as needed (Take when taking lasix). (Patient taking differently: Take 20 mEq by mouth 2 (two) times daily. ) 04/10/2015: When take Lasix   . predniSONE (DELTASONE) 10 MG tablet Follow the following taper: 42m x 1 more day, then 240mx 5 days, then 1033m 5 days   . tiotropium (SPIRIVA HANDIHALER) 18 MCG inhalation capsule Place 1 capsule (18 mcg total) into inhaler and inhale daily.    No facility-administered encounter medications on file as of 04/16/2015.    Family History  Problem Relation  Age of Onset  . Heart disease Mother 47 8 Deceased from MI at 47y2yo Hypertension Mother   . Heart disease Father 54 82 Deceased of MI age 54y74yo Hypertension Father   . Hypertension Brother   . Lung cancer      Grandmother    Social History   Social History  . Marital Status: Divorced    Spouse Name: N/A  . Number of Children: N/A  . Years of Education: N/A   Occupational History  . Not on file.   Social History Main Topics  . Smoking status: Current Every Day Smoker -- 2.00 packs/day for 45 years    Types: Cigarettes    Start date: 04/04/1969  . Smokeless tobacco: Never Used     Comment: 2 per day  . Alcohol Use: No  . Drug Use: No  . Sexual Activity: Not Currently    Birth Control/ Protection: None   Other Topics Concern  . Not on file   Social History Narrative   Formerly worked as a casScientist, water qualityow disabled.   Divorced.   2 grown children.   Lives with her grandson.    Review of Systems General: Admits to fatigue. Denies fever, chills, change in appetite and diaphoresis.  Respiratory: Admits to SOB, cough, DOE, and wheezing.  Denies chest tightness. Cardiovascular: Denies chest pain and palpitations.  Gastrointestinal: Denies nausea, vomiting, abdominal pain Skin: Denies rash and wounds.  Neurological: Denies dizziness, headaches, weakness, lightheadedness Psychiatric/Behavioral: Admits to sleep disturbance. Denies confusion.    Objective:   Physical Exam Filed Vitals:   04/16/15 1453  BP: 110/64  Pulse: 98  Temp: 98.5 F (36.9 C)  TempSrc: Oral  Weight: 243 lb (110.224 kg)   General: Vital signs reviewed.  Patient is chronically ill appearing, in no acute distress and cooperative with exam.  Cardiovascular: Tachycardic, regular rhythm, S1 normal, S2 normal, no murmurs, gallops, or rubs. Pulmonary/Chest: Diffuse rhonchi. Localized rales in left mid lung. No wheezes. Abdominal: Soft, non-tender, non-distended, BS + Extremities: 1-2+ pitting  edema in bilateral lower extremities up to mid shins.  + clubbing. Psychiatric: Normal mood and affect. speech and behavior is normal. Cognition and memory are normal.      Assessment & Plan:   Please see problem based assessment and plan.

## 2015-04-16 NOTE — Assessment & Plan Note (Addendum)
Patient has a history of grade 1 diastolic dysfunction on echocardiogram from February 2016. Patient took Lasix prn at home. On recent admission, weight was up at 253 lbs and patient was diuresed to her baseline weight on discharge, 241.5 lbs. Patient was discharged on Lasix 40 mg daily. A bedside commode was ordered for patient for frequent urination and difficulty ambulating. Weight today is 243 pounds. Patient denies weight gain or increased swelling, but she does have 1-2+ pitting edema. She did not take her lasix today as she knew she would be away from home.   Plan: -Lasix 40 mg daily -Kdur 40 mEq daily -BMET -DME Bedside commode

## 2015-04-16 NOTE — Patient Instructions (Signed)
FOR YOUR COPD: -CONTINUE OXYGEN -WE WILL ORDER AN "OXYMIZER" AND BEDSIDE COMMODE FOR YOU -WE WILL HAVE PULMONARY REHAB FOLLOW UP WITH YOU -CONTINUE YOUR PREDNISONE TAPER  FOR YOUR COUGH: -CONTINUE COUGH SYRUP -TESSALON PEARLS -TAKE MUCINEX 600 MG TWICE A DAY  FOR YOUR SWELLING: -TAKE LASIX 40 MG EVERY DAY

## 2015-04-16 NOTE — Assessment & Plan Note (Addendum)
Patient has a history of severe COPD stage 3D based on PFTs from August 2016. Patient was recently admitted to the hospital for a COPD exacerbation and treated with systemic steroids, breathing treatments and azithromycin. Patient was discharged home on extended prednisone taper, azithromycin to complete a 5 day course and referred to pulmonary rehab. Today, patient is satting 92% on our oxygen, but desaturates on her pulsating oxygen to mid 80s, sometimes lower to 70s. Patient was transitioned back to our continuous O2. She states she continues to have a cough, fatigue and feel short of breath. The cough keeps her up at night, but unfortunately I feel she is almost maxxed out on her therapy. Patient is on tessalon and dextromethorphan. I have added Mucinex. I would avoid Hycodan, Robitussin AC or Tussionex in her due to opioids and risk of suppressing her already very poor respiratory drive. Patient has not yet heard from pulmonary rehab or received her bedside commode or oxymizer.   Plan: -Continue home 4 L [place] -DME order placed for Oxymizer and bedside commode -Continue lasix for CHF -Follow up with Pulmonary rehab -Continue Spiriva daily -Continue fluticasone-salmeterol 250-50 mcg BID -Continue tessalon and dextromethorphan for cough -continue albuterol prn  -Continue home nebulizers

## 2015-04-16 NOTE — Patient Outreach (Signed)
Transition of care call (week 1- discharged 1/9, first attempt done 1/10).  Pt reports still coughing, hurt from coughing, can't sleep.  Pt reports f/u with MD today, MD added Muccinex DM, medicine being  picked from pharmacy now.  Pt reports medications the same from hospital discharge except added Muccinex.    Pt reports MD ordered an oxymizer, said would help and a  bedside commode.   Discussed with pt f/u again telephonically 1/18 as part of ongoing transition of care, discuss then scheduling home visit.     Zara Chess.   [place] [facility]  [phone]

## 2015-04-17 ENCOUNTER — Other Ambulatory Visit: Payer: Commercial Managed Care - HMO

## 2015-04-17 ENCOUNTER — Ambulatory Visit (HOSPITAL_COMMUNITY): Payer: Commercial Managed Care - HMO

## 2015-04-17 ENCOUNTER — Other Ambulatory Visit: Payer: Self-pay | Admitting: Pharmacist

## 2015-04-17 ENCOUNTER — Telehealth: Payer: Self-pay | Admitting: Internal Medicine

## 2015-04-17 LAB — BMP8+ANION GAP
Anion Gap: 17 mmol/L (ref 10.0–18.0)
BUN/Creatinine Ratio: 20 (ref 11–26)
BUN: 14 mg/dL (ref 8–27)
CALCIUM: 9.3 mg/dL (ref 8.7–10.3)
CHLORIDE: 86 mmol/L — AB (ref 96–106)
CO2: 39 mmol/L — ABNORMAL HIGH (ref 18–29)
CREATININE: 0.71 mg/dL (ref 0.57–1.00)
GFR, EST AFRICAN AMERICAN: 107 mL/min/{1.73_m2} (ref >59)
GFR, EST NON AFRICAN AMERICAN: 93 mL/min/{1.73_m2} (ref >59)
Glucose: 222 mg/dL — ABNORMAL HIGH (ref 65–99)
Potassium: 4 mmol/L (ref 3.5–5.2)
Sodium: 142 mmol/L (ref 134–144)

## 2015-04-17 NOTE — Patient Outreach (Signed)
[facility]) Care Management  [place]   04/17/2015  DANYAL ADORNO 1955/10/08 [id number]  Subjective: [name] is a 60 y.o. female who was referred to [facility] for smoking cessation.   [facility] is no longer providing smoking cessation services. I provided her the resources available, including 1-800-QUIT-NOW and the [place] Smoking Cessation Classes (phone number provided).  I asked patient if she was taking her furosemide because there was concern that she was not taking it after her hospitalization as she was not taking it before. She reports that she is currently taking all of her medications as prescribed and denies wanting or needing to review her medications.   Will not open to pharmacy program as patient declined services for medication management.   Nicoletta Ba, PharmD, [facility] [phone]

## 2015-04-17 NOTE — Telephone Encounter (Signed)
Pt called in and informed just released from hosptial will call back to reschedule

## 2015-04-19 NOTE — Progress Notes (Signed)
Internal Medicine Clinic Attending  Case discussed with Dr. Marvel Plan at the time of the visit.  We reviewed the resident's history and exam and pertinent patient test results.  I agree with the assessment, diagnosis, and plan of care documented in the resident's note.

## 2015-04-20 ENCOUNTER — Ambulatory Visit: Payer: Commercial Managed Care - HMO | Admitting: Internal Medicine

## 2015-04-22 ENCOUNTER — Other Ambulatory Visit: Payer: Self-pay | Admitting: *Deleted

## 2015-04-22 NOTE — Patient Outreach (Signed)
Transition of care call (week 2, discharged 1/9): Spoke with pt who reports cough doing better, out of Muccinex, not taking any medication for cough now.  Pt reports has some swelling, taking all of her medications, includes Lasix.  Pt reports oxymizer and bedside commode were not delivered yet.   Discussed with pt doing a home visit as part of transition of care to which pt agreed, home visit scheduled for 1/25.     Zara Chess.   [place] [facility] Management  [phone]

## 2015-04-24 ENCOUNTER — Telehealth: Payer: Self-pay | Admitting: *Deleted

## 2015-04-24 ENCOUNTER — Encounter (HOSPITAL_COMMUNITY): Payer: Self-pay

## 2015-04-24 ENCOUNTER — Ambulatory Visit (HOSPITAL_COMMUNITY)
Admission: RE | Admit: 2015-04-24 | Discharge: 2015-04-24 | Disposition: A | Discharge status: Home / Self Care | Payer: Commercial Managed Care - HMO | Source: Ambulatory Visit | Attending: Internal Medicine | Admitting: Internal Medicine

## 2015-04-24 DIAGNOSIS — C3492 Malignant neoplasm of unspecified part of left bronchus or lung: Secondary | ICD-10-CM | POA: Diagnosis not present

## 2015-04-24 DIAGNOSIS — I2510 Atherosclerotic heart disease of native coronary artery without angina pectoris: Secondary | ICD-10-CM | POA: Diagnosis not present

## 2015-04-24 DIAGNOSIS — I700 Atherosclerosis of aorta: Secondary | ICD-10-CM | POA: Insufficient documentation

## 2015-04-24 DIAGNOSIS — J439 Emphysema, unspecified: Secondary | ICD-10-CM | POA: Insufficient documentation

## 2015-04-24 MED ORDER — IOHEXOL 300 MG/ML  SOLN
75.0000 mL | Freq: Once | INTRAMUSCULAR | Status: AC | PRN
  Administered 2015-04-24: 75 mL via INTRAVENOUS

## 2015-05-01 ENCOUNTER — Other Ambulatory Visit: Payer: Self-pay | Admitting: *Deleted

## 2015-05-01 ENCOUNTER — Telehealth: Payer: Self-pay | Admitting: Internal Medicine

## 2015-05-01 NOTE — Patient Outreach (Signed)
F/u call to [facility] (Dr. Caroline More nurse) to return voice message left.   Discussed with MD's nurse findings from today's home visit with pt-  +2 edema in bilateral lower extremities/top of both feet, pain +8, red, warm to touch with left being worse, started a week ago, pt took 80 mg of Lasix this am- did not help. RN CM discussed with pt f/u at ED if gets worse.  Pt unable to see MD today due to problems with transportation,appointment was made for 1/30.   MD's nurse to relay this information  to MD plus to call pt - discuss f/u at ED.     Zara Chess.   [place] [facility]  [phone]

## 2015-05-01 NOTE — Telephone Encounter (Signed)
Patient called to r/s cx appt to 01/31 @ 11:30 lab, 12 MD.

## 2015-05-01 NOTE — Telephone Encounter (Signed)
HHN states at visit today it is noted that pt's bil lower legs are swollen, red and have multiple blister like area across R foot all toes, L lower leg is worse, states she took 20m lasix this am, she states the pain in her legs 8/10. She has no transportation and cannot come in today. She has appt mon but HHN is very worried about this situation, states that it is a pretty bad case of cellulitis in her opinion. She has encouraged pt to go to ED if she has any change even slight. Pt was agreeable. VS were stable, with 4.5 l/min o2 SAT 95%, states cough is not as bad, afebrile.  i called pt and ask her to go to ED or urgent care, she refuses and would like for someone to call her something in for her for the legs, she does have appt mon am. She states she does not want to go to ED and will wait til Monday unless someone calls a abx in

## 2015-05-01 NOTE — Telephone Encounter (Addendum)
Rec'd call from  [place] (ROSE).  Patient has Cellulitis in Both of her Legs that is very painful.  Patient was offered an appointment for today but, she has no transportation to get here.  Rose would like to know if an antibiotic medication can be called in until she is Seen on Monday 05/04/2015.  Patient is also requesting to switch her medication refills to [facility] on [address]

## 2015-05-01 NOTE — Patient Outreach (Signed)
[facility]) Care Management   05/01/2015 - home visit (part of transition of care)  [name] 1956/03/24 [id number]  [name] is an 60 y.o. female  Subjective:  Pt reports having swelling and pain in bilateral lower legs for a week,  took 80 mg of Lasix this am thinking it would help.  Pt states she also has been taking 600 mg of Gabapentin at night.   Pt reports both medications have not helped.  Pt reports left leg is worse.   Pt reports cough is better, when cough, mostly cough in morning/night.  Pt reports bringing up mucous, color clear.  Pt reports she has not been doing her nebulizer treatments.    Objective:   Filed Vitals:   05/01/15 1317  BP: 116/78  Pulse: 96  Resp: 20    ROS  Physical Exam  Constitutional: She is oriented to person, place, and time. She appears well-developed and well-nourished.  Cardiovascular: Regular rhythm.   Respiratory:  Congestion noted  in LUL, LLL, RLL posterior on expiration   GI: Soft. Bowel sounds are normal.  Musculoskeletal: Normal range of motion. She exhibits edema and tenderness.  Bilateral legs- swelling, redness, painful, warm to touch.   Neurological: She is alert and oriented to person, place, and time.  Skin: No rash noted. There is erythema.  Swelling, redness, warm to touch in bilateral lower legs/top of feet.   Psychiatric: She has a normal mood and affect. Her behavior is normal. Judgment and thought content normal.    Current Medications:  Reviewed with pt  Current Outpatient Prescriptions  Medication Sig Dispense Refill  . albuterol (PROVENTIL) (2.5 MG/3ML) 0.083% nebulizer solution Take 3 mLs (2.5 mg total) by nebulization every 6 (six) hours as needed for shortness of breath. 360 mL 3  . albuterol (VENTOLIN HFA) 108 (90 BASE) MCG/ACT inhaler Inhale 2 puffs into the lungs every 6 (six) hours as needed for shortness of breath. 18 g 11  . aspirin 81 MG chewable tablet Chew 1 tablet (81 mg total) by  mouth at bedtime. 90 tablet 3  . Fluticasone-Salmeterol (ADVAIR DISKUS) 250-50 MCG/DOSE AEPB Inhale 1 puff into the lungs 2 (two) times daily. 60 each 5  . furosemide (LASIX) 40 MG tablet Take 1 tablet (40 mg total) by mouth daily. 90 tablet 3  . lovastatin (MEVACOR) 40 MG tablet Take 1 tablet (40 mg total) by mouth at bedtime. 30 tablet 11  . OXYGEN Inhale 4 L into the lungs continuous.     . pantoprazole (PROTONIX) 40 MG tablet Take 1 tablet (40 mg total) by mouth daily. 30 tablet 3  . potassium chloride SA (K-DUR,KLOR-CON) 20 MEQ tablet Take 2 tablets (40 mEq total) by mouth daily as needed (Take when taking lasix). (Patient taking differently: Take 20 mEq by mouth 2 (two) times daily. ) 180 tablet 3  . tiotropium (SPIRIVA HANDIHALER) 18 MCG inhalation capsule Place 1 capsule (18 mcg total) into inhaler and inhale daily. 30 capsule 12  . benzonatate (TESSALON) 100 MG capsule Take 1 capsule (100 mg total) by mouth 2 (two) times daily. (Patient not taking: Reported on 05/01/2015) 20 capsule 0  . dextromethorphan (DELSYM) 30 MG/5ML liquid Take 5 mLs (30 mg total) by mouth 2 (two) times daily as needed for cough. (Patient not taking: Reported on 05/01/2015) 89 mL 0  . gabapentin (NEURONTIN) 300 MG capsule Take 1 capsule (300 mg total) by mouth 3 (three) times daily. (Patient taking differently: Take 300  mg by mouth at bedtime. ) 90 capsule 11  . guaiFENesin (MUCINEX) 600 MG 12 hr tablet Take 1 tablet (600 mg total) by mouth 2 (two) times daily. (Patient not taking: Reported on 05/01/2015) 60 tablet 3  . predniSONE (DELTASONE) 10 MG tablet Follow the following taper: 62m x 1 more day, then 261mx 5 days, then 1035m 5 days (Patient not taking: Reported on 05/01/2015) 19 tablet 0   No current facility-administered medications for this visit.    Functional Status:   In your present state of health, do you have any difficulty performing the following activities: 04/16/2015 04/10/2015  Hearing? N N  Vision?  N N  Difficulty concentrating or making decisions? N N  Walking or climbing stairs? Y Y  Dressing or bathing? N N  Doing errands, shopping? Y N    Fall/Depression Screening:    PHQ 2/9 Scores 04/16/2015 03/25/2015 01/16/2015 10/08/2014 10/02/2014 09/11/2014 07/23/2014  PHQ - 2 Score _0 0  PHQ- 9 Score - _1 - 10 -    Assessment:  Skin integrity- redness/swelling noted in bilateral lower legs/top of feet, left being the worse.  Warm to touch in bilateral lower legs/top of left foot.   Per pt, pain in both legs +8.                            COPD:  Congestion noted on expiration in LUL, LLL, RLL posterior bases.  Pt on 4.5 L [place]   Plan:  RN CM called Dr. KliCaroline Morefice, spoke with [place]- message to be sent to MD's nurse about redness/swelling/pain in pt's bilateral legs.  Also since pt was unable to f/u with MD today, appointment was made to see  MD 1/30.   Waiting for MD's nurse to return call.                        Pt to start back doing nebulizer treatments - help with congestion in lungs.            Plan to continue to follow pt for transition of care, next follow up will be telephonically 2/3.     THN CM Care Plan Problem Two        Most Recent Value   Care Plan Problem Two  Risk for readmission- 6 admits in last 6 months, most recent COPD exacerbation    Role Documenting the Problem Two  Care Management Coordinator   Care Plan for Problem Two  Active   Interventions for Problem Two Long Term Goal   Discussed f/u with weekly phone calls,schedule home visit, medication compliance    THN Long Term Goal (31-90) days  Pt would not readmit 31days from day of discharge    THN Long Term Goal Start Date  04/16/15   THN CM Short Term Goal #1 (0-30 days)  Pt would be compliant with all medications for next 30 days    THN CM Short Term Goal #1 Start Date  04/16/15   Interventions for Short Term Goal #2   Discussed with pt benefit  of taking nebulizer treatments - help break up  congestion.      THN CM Care Plan Problem Three        Most Recent Value   Care Plan Problem Three  Skin integrity- bilateral lower legs showing signs of infection (redness, pain, edema)  Role Documenting the Problem Three  Care Management Coordinator   Care Plan for Problem Three  Active   THN CM Short Term Goal #1 (0-30 days)  Pt's legs would show improvement in the next 7 days    THN CM Short Term Goal #1 Start Date  05/01/15   Interventions for Short Term Goal #1  RN CM called MD office, reported redness/pain/swelling/warm to touch in bilateral legs, MD appointment made for pt to f/u 1/30.       Zara Chess.   [place] Care Management  [phone]

## 2015-05-01 NOTE — Telephone Encounter (Signed)
Called pt, relayed instructions on lasix and ED, reminded pt of appt monday

## 2015-05-01 NOTE — Telephone Encounter (Signed)
From the description, bilateral lower extremity edema and redness is more likely to be stasis dermatitis, related to fluid overload, rather than due to cellulitis. I agree with the additional dose of lasix, she can take 79m tomorrow as well. [facility]

## 2015-05-04 ENCOUNTER — Inpatient Hospital Stay (HOSPITAL_COMMUNITY): Payer: Commercial Managed Care - HMO

## 2015-05-04 ENCOUNTER — Ambulatory Visit (INDEPENDENT_AMBULATORY_CARE_PROVIDER_SITE_OTHER): Payer: Commercial Managed Care - HMO | Admitting: Pulmonary Disease

## 2015-05-04 ENCOUNTER — Encounter: Payer: Self-pay | Admitting: Pulmonary Disease

## 2015-05-04 ENCOUNTER — Inpatient Hospital Stay (HOSPITAL_COMMUNITY)
Admission: AD | Admit: 2015-05-04 | Discharge: 2015-05-06 | DRG: 292 | Disposition: A | Discharge status: Home / Self Care | Payer: Commercial Managed Care - HMO | Source: Ambulatory Visit | Attending: Oncology | Admitting: Oncology

## 2015-05-04 VITALS — BP 120/57 | HR 88 | Temp 98.9°F | Resp 24 | Ht 65.0 in | Wt 253.7 lb

## 2015-05-04 DIAGNOSIS — I272 Other secondary pulmonary hypertension: Secondary | ICD-10-CM | POA: Diagnosis not present

## 2015-05-04 DIAGNOSIS — E8770 Fluid overload, unspecified: Secondary | ICD-10-CM | POA: Insufficient documentation

## 2015-05-04 DIAGNOSIS — C3432 Malignant neoplasm of lower lobe, left bronchus or lung: Secondary | ICD-10-CM | POA: Diagnosis present

## 2015-05-04 DIAGNOSIS — I872 Venous insufficiency (chronic) (peripheral): Secondary | ICD-10-CM | POA: Insufficient documentation

## 2015-05-04 DIAGNOSIS — C3492 Malignant neoplasm of unspecified part of left bronchus or lung: Secondary | ICD-10-CM | POA: Diagnosis present

## 2015-05-04 DIAGNOSIS — E119 Type 2 diabetes mellitus without complications: Secondary | ICD-10-CM | POA: Diagnosis present

## 2015-05-04 DIAGNOSIS — I5033 Acute on chronic diastolic (congestive) heart failure: Secondary | ICD-10-CM | POA: Diagnosis not present

## 2015-05-04 DIAGNOSIS — Z923 Personal history of irradiation: Secondary | ICD-10-CM | POA: Diagnosis not present

## 2015-05-04 DIAGNOSIS — I8311 Varicose veins of right lower extremity with inflammation: Secondary | ICD-10-CM | POA: Diagnosis not present

## 2015-05-04 DIAGNOSIS — G4730 Sleep apnea, unspecified: Secondary | ICD-10-CM | POA: Diagnosis present

## 2015-05-04 DIAGNOSIS — E785 Hyperlipidemia, unspecified: Secondary | ICD-10-CM | POA: Diagnosis present

## 2015-05-04 DIAGNOSIS — I2510 Atherosclerotic heart disease of native coronary artery without angina pectoris: Secondary | ICD-10-CM | POA: Diagnosis present

## 2015-05-04 DIAGNOSIS — Z9981 Dependence on supplemental oxygen: Secondary | ICD-10-CM

## 2015-05-04 DIAGNOSIS — Z6841 Body Mass Index (BMI) 40.0 and over, adult: Secondary | ICD-10-CM

## 2015-05-04 DIAGNOSIS — K219 Gastro-esophageal reflux disease without esophagitis: Secondary | ICD-10-CM | POA: Diagnosis present

## 2015-05-04 DIAGNOSIS — I5030 Unspecified diastolic (congestive) heart failure: Secondary | ICD-10-CM | POA: Diagnosis present

## 2015-05-04 DIAGNOSIS — R918 Other nonspecific abnormal finding of lung field: Secondary | ICD-10-CM | POA: Diagnosis not present

## 2015-05-04 DIAGNOSIS — E669 Obesity, unspecified: Secondary | ICD-10-CM | POA: Diagnosis not present

## 2015-05-04 DIAGNOSIS — J449 Chronic obstructive pulmonary disease, unspecified: Secondary | ICD-10-CM | POA: Diagnosis present

## 2015-05-04 DIAGNOSIS — I8312 Varicose veins of left lower extremity with inflammation: Secondary | ICD-10-CM | POA: Diagnosis not present

## 2015-05-04 DIAGNOSIS — F17210 Nicotine dependence, cigarettes, uncomplicated: Secondary | ICD-10-CM | POA: Diagnosis present

## 2015-05-04 DIAGNOSIS — I5032 Chronic diastolic (congestive) heart failure: Secondary | ICD-10-CM

## 2015-05-04 DIAGNOSIS — M793 Panniculitis, unspecified: Secondary | ICD-10-CM

## 2015-05-04 DIAGNOSIS — I509 Heart failure, unspecified: Secondary | ICD-10-CM

## 2015-05-04 DIAGNOSIS — Z955 Presence of coronary angioplasty implant and graft: Secondary | ICD-10-CM

## 2015-05-04 DIAGNOSIS — I8310 Varicose veins of unspecified lower extremity with inflammation: Secondary | ICD-10-CM

## 2015-05-04 DIAGNOSIS — Z720 Tobacco use: Secondary | ICD-10-CM | POA: Diagnosis present

## 2015-05-04 LAB — CBC
HCT: 40.5 % (ref 36.0–46.0)
Hemoglobin: 12.5 g/dL (ref 12.0–15.0)
MCH: 29.8 pg (ref 26.0–34.0)
MCHC: 30.9 g/dL (ref 30.0–36.0)
MCV: 96.7 fL (ref 78.0–100.0)
Platelets: 218 10*3/uL (ref 150–400)
RBC: 4.19 MIL/uL (ref 3.87–5.11)
RDW: 14.4 % (ref 11.5–15.5)
WBC: 8.8 10*3/uL (ref 4.0–10.5)

## 2015-05-04 LAB — BRAIN NATRIURETIC PEPTIDE: B NATRIURETIC PEPTIDE 5: 75 pg/mL (ref 0.0–100.0)

## 2015-05-04 LAB — TROPONIN I: Troponin I: <0.03 ng/mL (ref <0.031)

## 2015-05-04 LAB — COMPREHENSIVE METABOLIC PANEL
ALBUMIN: 2.7 g/dL — AB (ref 3.5–5.0)
ALT: 20 U/L (ref 14–54)
ANION GAP: 7 (ref 5–15)
AST: 19 U/L (ref 15–41)
Alkaline Phosphatase: 180 U/L — ABNORMAL HIGH (ref 38–126)
BUN: <5 mg/dL — ABNORMAL LOW (ref 6–20)
CO2: 39 mmol/L — AB (ref 22–32)
Calcium: 9.2 mg/dL (ref 8.9–10.3)
Chloride: 95 mmol/L — ABNORMAL LOW (ref 101–111)
Creatinine, Ser: 0.79 mg/dL (ref 0.44–1.00)
GFR calc non Af Amer: >60 mL/min (ref >60)
GLUCOSE: 137 mg/dL — AB (ref 65–99)
POTASSIUM: 3.8 mmol/L (ref 3.5–5.1)
SODIUM: 141 mmol/L (ref 135–145)
TOTAL PROTEIN: 6.4 g/dL — AB (ref 6.5–8.1)
Total Bilirubin: 0.4 mg/dL (ref 0.3–1.2)

## 2015-05-04 LAB — TSH: TSH: 3.879 u[IU]/mL (ref 0.350–4.500)

## 2015-05-04 MED ORDER — DEXTROMETHORPHAN POLISTIREX ER 30 MG/5ML PO SUER
30.0000 mg | Freq: Two times a day (BID) | ORAL | Status: DC | PRN
Start: 2015-05-04 — End: 2015-05-06
  Filled 2015-05-04: qty 5

## 2015-05-04 MED ORDER — IPRATROPIUM-ALBUTEROL 0.5-2.5 (3) MG/3ML IN SOLN: 3.0000 mL | RESPIRATORY_TRACT | Status: DC | PRN

## 2015-05-04 MED ORDER — PRAVASTATIN SODIUM 40 MG PO TABS
40.0000 mg | ORAL_TABLET | Freq: Every day | ORAL | Status: DC
  Administered 2015-05-04 – 2015-05-05 (×2): 40 mg via ORAL
  Filled 2015-05-04 (×2): qty 1

## 2015-05-04 MED ORDER — POTASSIUM CHLORIDE CRYS ER 20 MEQ PO TBCR
40.0000 meq | EXTENDED_RELEASE_TABLET | Freq: Two times a day (BID) | ORAL | Status: DC
  Administered 2015-05-04 – 2015-05-06 (×5): 40 meq via ORAL
  Filled 2015-05-04 (×5): qty 2

## 2015-05-04 MED ORDER — PANTOPRAZOLE SODIUM 40 MG PO TBEC
40.0000 mg | DELAYED_RELEASE_TABLET | Freq: Every day | ORAL | Status: DC
  Administered 2015-05-05 – 2015-05-06 (×2): 40 mg via ORAL
  Filled 2015-05-04 (×2): qty 1

## 2015-05-04 MED ORDER — HEPARIN SODIUM (PORCINE) 5000 UNIT/ML IJ SOLN
5000.0000 [IU] | Freq: Three times a day (TID) | INTRAMUSCULAR | Status: DC
  Administered 2015-05-04 – 2015-05-06 (×6): 5000 [IU] via SUBCUTANEOUS
  Filled 2015-05-04 (×6): qty 1

## 2015-05-04 MED ORDER — ALBUTEROL SULFATE (2.5 MG/3ML) 0.083% IN NEBU
2.5000 mg | INHALATION_SOLUTION | RESPIRATORY_TRACT | Status: DC | PRN
Start: 2015-05-04 — End: 2015-05-06

## 2015-05-04 MED ORDER — FUROSEMIDE 10 MG/ML IJ SOLN
80.0000 mg | Freq: Three times a day (TID) | INTRAMUSCULAR | Status: DC
  Administered 2015-05-04 – 2015-05-06 (×6): 80 mg via INTRAVENOUS
  Filled 2015-05-04 (×6): qty 8

## 2015-05-04 MED ORDER — GABAPENTIN 300 MG PO CAPS
300.0000 mg | ORAL_CAPSULE | Freq: Every day | ORAL | Status: DC
  Administered 2015-05-05 (×2): 300 mg via ORAL
  Filled 2015-05-04 (×3): qty 1

## 2015-05-04 MED ORDER — SODIUM CHLORIDE 0.9% FLUSH
3.0000 mL | Freq: Two times a day (BID) | INTRAVENOUS | Status: DC
  Administered 2015-05-04 – 2015-05-06 (×5): 3 mL via INTRAVENOUS

## 2015-05-04 MED ORDER — ACETAMINOPHEN 325 MG PO TABS
650.0000 mg | ORAL_TABLET | Freq: Four times a day (QID) | ORAL | Status: DC | PRN
  Administered 2015-05-06: 650 mg via ORAL
  Filled 2015-05-04: qty 2

## 2015-05-04 MED ORDER — ASPIRIN 81 MG PO CHEW
81.0000 mg | CHEWABLE_TABLET | Freq: Every day | ORAL | Status: DC
  Administered 2015-05-05 (×2): 81 mg via ORAL
  Filled 2015-05-04 (×3): qty 1

## 2015-05-04 NOTE — Consult Note (Signed)
   [facility] CM Inpatient Consult   05/04/2015  [name] Dec 15, 1955 [id number] Patient is an active [place] [facility] patient.   Patient has been engaged by a [employer]. Our community based plan of care has focused on disease management and community resource support.  Patient will receive a post discharge transition of care call and will be evaluated for monthly home visits for assessments and disease process education.  Made Inpatient Case Manager aware that [place] Management following. Of note, [facility] Care Management services does not replace or interfere with any services that are needed or arranged by inpatient case management or social work.  For additional questions or referrals please contact: Natividad Brood, RN BSN [facility] Liaison  [phone] business mobile phone Toll free office [phone]

## 2015-05-04 NOTE — Progress Notes (Signed)
I saw and evaluated the patient.  I personally confirmed the key portions of Dr. Marjory Sneddon history and exam and reviewed pertinent patient test results.  The assessment, diagnosis, and plan were formulated together and I agree with the documentation in the resident's note.  Her social situation remains problematic and she has been sleeping in a recliner.  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  There also may be a component of acute on chronic diastolic heart failure although I believe her social situation with sleeping in a recliner and questionable ability to self manage her chronic medical problems is contributing more to her painful LE edema.  Her pulmonary disease is stable and this does not represent an acute decompensation of her cor pulmonale.  She will likely benefit from IV diuresis, lying in bed with legs elevated, and the use of "Juxtalight" compression stockings rather than the traditional compression stockings, which I do not believe she will be able to apply with any required frequency to be effective.  Social work may want to further explore housing options to allow her to have a bed so her legs can be elevated at least at night (currently lives in a 2 bedroom house with 7 adults per her report).

## 2015-05-04 NOTE — Assessment & Plan Note (Signed)
Assessment: Increased erythema of BLE symmetric. Suspect this is a panniculitis of her chronic venous insufficiency. She is afebrile today - low suspicion for cellulitis.  Plan: -Diuresis per inpatient team -Consider compression stockings

## 2015-05-04 NOTE — Progress Notes (Signed)
Subjective:    Patient ID: [name], female    DOB: January 04, 1956, 60 y.o.   MRN: [id number]  HPI [name] is a 60 year old woman with history of CAD s/p PCI in 2010, pulmonary HTN, COPD on 4L [place], diastolic dysfunction, prediabetes, obesity, GERD, lung cancer, depression presenting for evaluation of BLE erythema.  She reports that for the past week she has noticed increasing erythema of her BLE. She first noticed that her feet and ankles were red. The redness increased to midshins bilaterally by 3 days ago. Now it is to her thighs. There is increased warmth. No drainage. No trauma. She has had increased BLE swelling. She has pain that is new for the past 4 days. It is a throbbing, shooting pain with tingling. Minimal worsening of her baseline dyspnea. Denies chest pain.She takes Lasix 51m daily. She did not take her dose this morning. She has also tried tylenol and Aleve for her pain with no relief.   Review of Systems Ears, nose, mouth, throat, and face: +chronic cough Gastrointestinal: no nausea/vomiting, no abdominal pain, no constipation, no diarrhea Genitourinary: no dysuria  Past Medical History  Diagnosis Date  . Coronary artery disease     S/P PCI of LAD with DES (12/2008). Total occlusion of RCA noted at that time., medically managed. ACS ruled out 03/2009 with Lexiscan myoview . Followed by [facility].  . [facility]     2-D Echo (062/7035 - Systolic pressure was moderately increased. PA peak pressure  555mg. 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 Followed by Dr. WrJoya GaskinsLVelora Heckler . Diastolic dysfunction     2-D Echo (12/2008) - Normal LV Systolic funciton with EF 60-65%. Grade 1 diastolid dysfunction. No regional wall motion abnormalities. Moderate pulmonary HTN with PA peak pressure 5973m.  . CMarland KitchenPD (chronic obstructive pulmonary disease) (HCC)    Severe. Gold Stage IV.  PFTs (12/2008) - severe obstructive airway disease. Active tobacco use. Requires 4L O2 at home.  . Pulmonary nodule, right     Small right middle lobe nodule. Stable as of 12/2008.  . PMarland Kitchenediabetes     HgbA1c 6.4 (12/2008)  . Hx MRSA infection     Recurrent MRSA thigh abscesses.  . Obesity   . Hyperlipidemia   . GERD (gastroesophageal reflux disease)     S/P Nissen fundoplication.  . CHF (congestive heart failure) ([facility] . On home oxygen therapy since 2010    4L all the time  . Shortness of breath dyspnea     with a lot of exertion; if fluid builds up  . Pneumonia   . Depression   . Full dentures   . Chronic bronchitis ([facility]   "get it most years" (03/03/2015)  . Lung cancer ([facility]   "left"  . History of hiatal hernia   . Arthritis     "some scattered" (03/03/2015)   Social History   Social History  . Marital Status: Divorced    Spouse Name: N/A  . Number of Children: N/A  . Years of Education: N/A   Social History Main Topics  . Smoking status: Current Every Day Smoker -- 0.10 packs/day for 45 years    Types: Cigarettes    Start date: 04/04/1969  . Smokeless tobacco: Never Used     Comment: 1 cig per day  . Alcohol Use: No  . Drug  Use: No  . Sexual Activity: Not Currently    Birth Control/ Protection: None   Other Topics Concern  . None   Social History Narrative   Formerly worked as a Scientist, water quality, now disabled.   Divorced.   2 grown children.   Lives with her grandson.    Current Outpatient Prescriptions on File Prior to Visit  Medication Sig Dispense Refill  . albuterol (PROVENTIL) (2.5 MG/3ML) 0.083% nebulizer solution Take 3 mLs (2.5 mg total) by nebulization every 6 (six) hours as needed for shortness of breath. 360 mL 3  . albuterol (VENTOLIN HFA) 108 (90 BASE) MCG/ACT inhaler Inhale 2 puffs into the lungs every 6 (six) hours as needed for shortness of breath. 18 g 11  . aspirin 81 MG chewable tablet Chew 1 tablet (81 mg total)  by mouth at bedtime. 90 tablet 3  . benzonatate (TESSALON) 100 MG capsule Take 1 capsule (100 mg total) by mouth 2 (two) times daily. (Patient not taking: Reported on 05/01/2015) 20 capsule 0  . dextromethorphan (DELSYM) 30 MG/5ML liquid Take 5 mLs (30 mg total) by mouth 2 (two) times daily as needed for cough. (Patient not taking: Reported on 05/01/2015) 89 mL 0  . Fluticasone-Salmeterol (ADVAIR DISKUS) 250-50 MCG/DOSE AEPB Inhale 1 puff into the lungs 2 (two) times daily. 60 each 5  . furosemide (LASIX) 40 MG tablet Take 1 tablet (40 mg total) by mouth daily. 90 tablet 3  . gabapentin (NEURONTIN) 300 MG capsule Take 1 capsule (300 mg total) by mouth 3 (three) times daily. (Patient taking differently: Take 300 mg by mouth at bedtime. ) 90 capsule 11  . guaiFENesin (MUCINEX) 600 MG 12 hr tablet Take 1 tablet (600 mg total) by mouth 2 (two) times daily. (Patient not taking: Reported on 05/01/2015) 60 tablet 3  . lovastatin (MEVACOR) 40 MG tablet Take 1 tablet (40 mg total) by mouth at bedtime. 30 tablet 11  . OXYGEN Inhale 4 L into the lungs continuous.     . pantoprazole (PROTONIX) 40 MG tablet Take 1 tablet (40 mg total) by mouth daily. 30 tablet 3  . potassium chloride SA (K-DUR,KLOR-CON) 20 MEQ tablet Take 2 tablets (40 mEq total) by mouth daily as needed (Take when taking lasix). (Patient taking differently: Take 20 mEq by mouth 2 (two) times daily. ) 180 tablet 3  . predniSONE (DELTASONE) 10 MG tablet Follow the following taper: 45m x 1 more day, then 278mx 5 days, then 1063m 5 days (Patient not taking: Reported on 05/01/2015) 19 tablet 0  . tiotropium (SPIRIVA HANDIHALER) 18 MCG inhalation capsule Place 1 capsule (18 mcg total) into inhaler and inhale daily. 30 capsule 12   No current facility-administered medications on file prior to visit.    Today's Vitals   05/04/15 1051 05/04/15 1053  BP: 120/57   Pulse: 88   Temp: 98.9 F (37.2 C)   TempSrc: Oral   Resp: 24   Height: _0  (1.651  m)   Weight: 253 lb 11.2 oz (115.078 kg)   SpO2: 94%   PainSc:  10-Worst pain ever    Objective:   Physical Exam  Constitutional: She appears well-developed. No distress.  HENT:  Head: Normocephalic and atraumatic.  Eyes: Conjunctivae are normal. No scleral icterus.  Cardiovascular: Normal rate, regular rhythm and normal heart sounds.   Pulmonary/Chest: No respiratory distress. She has wheezes (scattered). She has rales (Scattered).  Musculoskeletal: She exhibits edema (3+ BLE to thighs) and tenderness.  Neurological: She  is alert.  Skin: Skin is dry. There is erythema (BLE to thighs).  No ulcerations   Assessment & Plan:  Please refer to problem based charting.

## 2015-05-04 NOTE — H&P (Signed)
Date: 05/04/2015               Patient Name:  [name] MRN: [id number]  DOB: 06-03-1955 Age / Sex: 60 y.o., female   PCP: Morgan Linsey, MD         Medical Service: Internal Medicine Teaching Service         Attending Physician: Dr. Annia Belt, MD    First Contact: Dr. Jule Ser Pager: [phone]  Second Contact: Dr. Albin Felling Pager: [phone]       After Hours (After 5p/  First Contact Pager: [phone]  weekends / holidays): Second Contact Pager: [phone]   Chief Complaint: leg swelling  History of Present Illness: [name] is a 60 y.o. female with past medical history of stage 2 NSCLC (completed radiation therapy on 02/25/2015), COPDn stage 3D on home oxygen requirement of 4L via [place] (FEV1 42%, FEV1/FVC 88% on 12/02/2014), pulmonary hypertension, CAD status post DES 2010, congestive heart failure with EF 50-55%, BPPV and vitamin B12 deficiency who presents as a direct admit from the Internal Medicine Clinic this afternoon due to increased lower extremity swelling and pain.  Patient reports that she has had increased lower extremity swelling and erythema of her bilateral lower extremities for past week.  She reports compliance with her home Lasix dose of 20m daily  since her most recent hospital discharge.  She recently increased this to 823mdaily after calling the clinic on 1/27.  She reports watching her salt intake but otherwise has not been restricting her fluid intake.  She has been sleeping in a recliner due to living situation.  Patient reports initially noticing that her feet and ankles were red and this then spread to include her shins 3 days ago.  Again, this remained bilateral.  Patient now states it has gone to her thighs.  This has coincided with increased swelling.   No drainage or trauma to the area although patient reports feeling that the bottoms of her feet have felt moist.  With this increased swelling and erythema, patient also reports new pain for  the past 4 days described as throbbing and tingling.  She has attempted pain control with Tylenol and Aleve without relief.  She has minimal worsening of her baseline dyspnea.  Currently on 4.5L via nasal cannula whereas her home requirement is 4L.  She denies chest pain.  She reports ongoing cough that frequently keeps her up at night.  Mildly productive.  Denies fevers but says she has had some chills.  No nausea or vomiting, no myalgias, no sore throat, no sinus congestion or rhinorrhea.  Her weight in clinic today is back up to 253 pounds.  At last discharge, we had diuresed her to 241 pounds (which is close to her baseline).  Admission to hospital weight is 247.  She was seen in clinic on 04/16/2015 and weighed 243 pounds.  Meds: Current Facility-Administered Medications  Medication Dose Route Frequency Provider Last Rate Last Dose  . albuterol (PROVENTIL) (2.5 MG/3ML) 0.083% nebulizer solution 2.5 mg  2.5 mg Nebulization Q2H PRN CaJuliet RudeMD      . aspirin chewable tablet 81 mg  81 mg Oral QHS Carly J Rivet, MD      . dextromethorphan (DELSYM) 30 MG/5ML liquid 30 mg  30 mg Oral BID PRN Carly J Rivet, MD      . furosemide (LASIX) injection 80 mg  80 mg Intravenous 3 times per day CaJuliet RudeMD      .  gabapentin (NEURONTIN) capsule 300 mg  300 mg Oral QHS Carly J Rivet, MD      . heparin injection 5,000 Units  5,000 Units Subcutaneous 3 times per day Carly J Rivet, MD      . ipratropium-albuterol (DUONEB) 0.5-2.5 (3) MG/3ML nebulizer solution 3 mL  3 mL Nebulization Q4H PRN Carly J Rivet, MD      . pantoprazole (PROTONIX) EC tablet 40 mg  40 mg Oral Daily Carly J Rivet, MD      . potassium chloride SA (K-DUR,KLOR-CON) CR tablet 40 mEq  40 mEq Oral BID Carly J Rivet, MD      . pravastatin (PRAVACHOL) tablet 40 mg  40 mg Oral q1800 Carly J Rivet, MD      . sodium chloride flush (NS) 0.9 % injection 3 mL  3 mL Intravenous Q12H Carly Montey Hora, MD        Allergies: Allergies as of  05/04/2015 - Review Complete 05/04/2015  Allergen Reaction Noted  . Fluconazole Anaphylaxis, Itching, and Swelling   . Atorvastatin Other (See Comments) 09/09/2014   Past Medical History  Diagnosis Date  . Coronary artery disease     S/P PCI of LAD with DES (12/2008). Total occlusion of RCA noted at that time., medically managed. ACS ruled out 03/2009 with Lexiscan myoview . Followed by [facility].  . Pulmonary hypertension ([place])     2-D Echo (74/8270) - Systolic pressure was moderately increased. PA peak pressure  67mHg. 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 Followed by Dr. WJoya Gaskins(Velora Heckler  . Diastolic dysfunction     2-D Echo (12/2008) - Normal LV Systolic funciton with EF 60-65%. Grade 1 diastolid dysfunction. No regional wall motion abnormalities. Moderate pulmonary HTN with PA peak pressure 546mg.  . Marland KitchenOPD (chronic obstructive pulmonary disease) (HCC)     Severe. Gold Stage IV.  PFTs (12/2008) - severe obstructive airway disease. Active tobacco use. Requires 4L O2 at home.  . Pulmonary nodule, right     Small right middle lobe nodule. Stable as of 12/2008.  . Marland Kitchenrediabetes     HgbA1c 6.4 (12/2008)  . Hx MRSA infection     Recurrent MRSA thigh abscesses.  . Obesity   . Hyperlipidemia   . GERD (gastroesophageal reflux disease)     S/P Nissen fundoplication.  . CHF (congestive heart failure) ([facility]  . On home oxygen therapy since 2010    4L all the time  . Shortness of breath dyspnea     with a lot of exertion; if fluid builds up  . Pneumonia   . Depression   . Full dentures   . Chronic bronchitis ([facility]    "get it most years" (03/03/2015)  . Lung cancer ([facility]    "left"  . History of hiatal hernia   . Arthritis     "some scattered" (03/03/2015)   Past Surgical History  Procedure Laterality Date  . Nissen fundoplication    . Right heart catheterization N/A 11/09/2012      Procedure: RIGHT HEART CATH;  Surgeon: DaLarey DresserMD;  [facility];  Service: Cardiovascular;  Laterality: N/A;  . Appendectomy    . Tonsillectomy    . Back surgery    . Knee arthroscopy Right ~ 2000  . Tubal ligation  1979  . Video bronchoscopy with endobronchial ultrasound N/A 12/15/2014    Procedure: VIDEO BRONCHOSCOPY WITH  ENDOBRONCHIAL ULTRASOUND;  Surgeon: Ivin Poot, MD;  Location[facility];  Service: Thoracic;  Laterality: N/A;  . Coronary angioplasty with stent placement  2010    [facility]; RCA  . Bilateral oophorectomy  1979  . Hernia repair    . Breast lumpectomy Right 1990's     (biopsy negative)  . Lumbar disc surgery  2001    "herniated discs; both by [facility], Dr. Lorin Mercy"  . Abdominal hysterectomy  1980's    "still have my cervix"   Family History  Problem Relation Age of Onset  . Heart disease Mother 27    Deceased from MI at 64yo  . Hypertension Mother   . Heart disease Father 82    Deceased of MI age 81yo  . Hypertension Father   . Hypertension Brother   . Lung cancer      Grandmother   Social History   Social History  . Marital Status: Divorced    Spouse Name: N/A  . Number of Children: N/A  . Years of Education: N/A   Occupational History  . Not on file.   Social History Main Topics  . Smoking status: Current Every Day Smoker -- 0.10 packs/day for 45 years    Types: Cigarettes    Start date: 04/04/1969  . Smokeless tobacco: Never Used     Comment: 1 cig per day  . Alcohol Use: No  . Drug Use: No  . Sexual Activity: Not Currently    Birth Control/ Protection: None   Other Topics Concern  . Not on file   Social History Narrative   Formerly worked as a Scientist, water quality, now disabled.   Divorced.   2 grown children.   Lives with her grandson.    Review of Systems: Pertinent items are noted in HPI.  Physical Exam: Blood pressure 117/59, pulse 82, temperature 98.4 F (36.9 C), temperature source Oral, resp. rate 20, height 5'  5" (1.651 m), weight 247 lb 11.2 oz (112.356 kg), SpO2 95 %.  Physical Exam  Constitutional: She is oriented to person, place, and time.  Obese, chronically ill appearing, sitting in wheelchair, on supplemental oxygen via [place]  HENT:  Head: Normocephalic and atraumatic.  Eyes: Conjunctivae and EOM are normal.  Neck: Normal range of motion.  JVD not appreciated  Cardiovascular: Normal rate and regular rhythm.   No murmur heard. Pulmonary/Chest:  Scattered wheezes and fine crackles in the bases.  No increased work of breathing currently.  Abdominal: Soft. Bowel sounds are normal. She exhibits no distension. There is no tenderness.  Musculoskeletal:  2-3+ pitting edema of bilateral lower extremities extending past her knees to thighs.  No abdominal pitting edema  Lymphadenopathy:    She has no cervical adenopathy.  Neurological: She is alert and oriented to person, place, and time.  Skin:  Warmth and erythema present bilaterally of her lower extremities.  No ulcerations or drainage    Lab results: Basic Metabolic Panel: No results for input(s): NA, K, CL, CO2, GLUCOSE, BUN, CREATININE, CALCIUM, MG, PHOS in the last 72 hours. Liver Function Tests: No results for input(s): AST, ALT, ALKPHOS, BILITOT, PROT, ALBUMIN in the last 72 hours. No results for input(s): LIPASE, AMYLASE in the last 72 hours. No results for input(s): AMMONIA in the last 72 hours. CBC: No results for input(s): WBC, NEUTROABS, HGB, HCT, MCV, PLT in the last 72 hours. Cardiac Enzymes: No results for input(s): CKTOTAL, CKMB, CKMBINDEX, TROPONINI in the last 72 hours. BNP: No results for input(s):  PROBNP in the last 72 hours. D-Dimer: No results for input(s): DDIMER in the last 72 hours. CBG: No results for input(s): GLUCAP in the last 72 hours. Hemoglobin A1C: No results for input(s): HGBA1C in the last 72 hours. Fasting Lipid Panel: No results for input(s): CHOL, HDL, LDLCALC, TRIG, CHOLHDL, LDLDIRECT in the  last 72 hours. Thyroid Function Tests: No results for input(s): TSH, T4TOTAL, FREET4, T3FREE, THYROIDAB in the last 72 hours. Anemia Panel: No results for input(s): VITAMINB12, FOLATE, FERRITIN, TIBC, IRON, RETICCTPCT in the last 72 hours. Coagulation: No results for input(s): LABPROT, INR in the last 72 hours. Urine Drug Screen: Drugs of Abuse     Component Value Date/Time   LABOPIA NONE DETECTED 07/01/2013 1218   LABOPIA NEGATIVE 02/04/2009 2315   COCAINSCRNUR NONE DETECTED 07/01/2013 1218   COCAINSCRNUR NEGATIVE 02/04/2009 2315   LABBENZ NONE DETECTED 07/01/2013 1218   LABBENZ NEGATIVE 02/04/2009 2315   AMPHETMU NONE DETECTED 07/01/2013 1218   AMPHETMU NEGATIVE 02/04/2009 2315   THCU NONE DETECTED 07/01/2013 1218   LABBARB NONE DETECTED 07/01/2013 1218    Alcohol Level: No results for input(s): ETH in the last 72 hours. Urinalysis: No results for input(s): COLORURINE, LABSPEC, PHURINE, GLUCOSEU, HGBUR, BILIRUBINUR, KETONESUR, PROTEINUR, UROBILINOGEN, NITRITE, LEUKOCYTESUR in the last 72 hours.  Invalid input(s): APPERANCEUR   Imaging results:  No results found.  Other results: EKG: none recorded at time of H&P  Assessment & Plan by Problem: Active Problems:   Acute exacerbation of congestive heart failure ([place])   60 y.o. female with past medical history of stage 2 NSCLC (completed radiation therapy on 02/25/2015), COPDn stage 3D on home oxygen requirement of 4L via [place] (FEV1 42%, FEV1/FVC 88% on 12/02/2014), pulmonary hypertension, CAD status post DES 2010, congestive heart failure with EF 50-55%, and vitamin B12 deficiency who presents as a direct admit from the Internal Medicine Clinic due to increased lower extremity swelling and pain.  Acute Exacerbation of Chronic HFpEF: increased peripheral edema, increased weight gain of 10 pounds from January 12.  No significant increase in her baseline dyspnea to suggest an underlying COPD exacerbation.  Oxygen saturation is okay  on her home requirement.  She is reportedly sleeping in a recliner due to poor social situation and this has likely contributed to painful edema given her chronic venous insufficiency.  She reports taking her Lasix as directed but this was an issue with last hospitalization based on her living situation which has not changed so non-compliance with diuretics remains a possibility.  Her last Echo was in February 2016 which showed preserved EF and grade 1 diastolic dysfunction.   - check BNP - check troponin x 1  - check TSH - check CBC, CMP - monitor on telemetry and check EKG - chest x-ray - TED hose, elevate legs - daily weights, I/O's - Lasix 85m IV TID - K-Dur 417m BID  Lipodermatosclerosis: without ulceration and likely due to chronic venous insufficiency in setting of sleeping upright in a recliner - as above, continue diuresis, elevate legs, TED hose - do not suspect cellulitis  COPD Gold Stage 3D: patient with mild decrease in her baseline dyspnea.  Chronic cough that is mildly productive, afebrile.  She continues to smoke.  Do not suspect an exacerbation of her chronic lung disease or pneumonia in recently hospitalized patient.  - check CBC - albuterol nebulizers PRN - duonebs Q4H PRN - dextromethorphan BID PRN.  Would try to avoid cough medications with opiate to avoid further depressing her already  poor respiratory drive - supplemental oxygen  History of Stage 2a NSCLC: status post radiation therapy completed 11/23  - Followed by Dr. Pablo Ledger ([facility])  GERD - Protonix 40 mg daily  CAD: on Lovastatin and aspirin at home - Pravastatin and aspirin  Health Maintenance: - check A1C, HIV  FEN Fluids: none Electrolytes: K-dur 35mq BID and monitor lytes Nutrition: heart healthy  DVT PPx: Heparin SQ  CODE: FULL   Dispo: Disposition is deferred at this time, awaiting improvement of current medical problems.   The patient does have a current  PCP (Morgan Linsey MD) and [facility].  The patient does have transportation limitations that hinder transportation to clinic appointments.  Signed: AJule Ser DO 05/04/2015, 1:59 PM

## 2015-05-04 NOTE — Assessment & Plan Note (Signed)
Assessment: Increased peripheral edema. Weight increased from 243 on 04/16/2015 to 253 today. Minimal increase in dyspnea from baseline. She is sleeping in a recliner due to social situation. +PND. Suspect medication noncompliance but she reports she takes Lasix 70m daily. No recent chest pain. Last echo in Feb 2016 with preserved ejection fraction and grade 1 diastolic dysfunction.   Plan: -Admit to inpatient for further management. -Likely will need IV diuresis. -Discussed with inpatient team.

## 2015-05-05 ENCOUNTER — Other Ambulatory Visit: Payer: Commercial Managed Care - HMO

## 2015-05-05 ENCOUNTER — Ambulatory Visit: Payer: Commercial Managed Care - HMO | Admitting: Internal Medicine

## 2015-05-05 ENCOUNTER — Encounter (HOSPITAL_COMMUNITY): Payer: Self-pay | Admitting: General Practice

## 2015-05-05 DIAGNOSIS — Z7982 Long term (current) use of aspirin: Secondary | ICD-10-CM

## 2015-05-05 DIAGNOSIS — Z9981 Dependence on supplemental oxygen: Secondary | ICD-10-CM

## 2015-05-05 DIAGNOSIS — K219 Gastro-esophageal reflux disease without esophagitis: Secondary | ICD-10-CM

## 2015-05-05 DIAGNOSIS — C3432 Malignant neoplasm of lower lobe, left bronchus or lung: Secondary | ICD-10-CM

## 2015-05-05 DIAGNOSIS — Z923 Personal history of irradiation: Secondary | ICD-10-CM

## 2015-05-05 DIAGNOSIS — I872 Venous insufficiency (chronic) (peripheral): Secondary | ICD-10-CM | POA: Insufficient documentation

## 2015-05-05 DIAGNOSIS — I8311 Varicose veins of right lower extremity with inflammation: Secondary | ICD-10-CM

## 2015-05-05 DIAGNOSIS — E8770 Fluid overload, unspecified: Secondary | ICD-10-CM | POA: Insufficient documentation

## 2015-05-05 DIAGNOSIS — I8312 Varicose veins of left lower extremity with inflammation: Secondary | ICD-10-CM

## 2015-05-05 DIAGNOSIS — Z79899 Other long term (current) drug therapy: Secondary | ICD-10-CM

## 2015-05-05 DIAGNOSIS — I5033 Acute on chronic diastolic (congestive) heart failure: Principal | ICD-10-CM

## 2015-05-05 DIAGNOSIS — J449 Chronic obstructive pulmonary disease, unspecified: Secondary | ICD-10-CM

## 2015-05-05 DIAGNOSIS — I2510 Atherosclerotic heart disease of native coronary artery without angina pectoris: Secondary | ICD-10-CM

## 2015-05-05 LAB — HEMOGLOBIN A1C
Hgb A1c MFr Bld: 7.7 % — ABNORMAL HIGH (ref 4.8–5.6)
MEAN PLASMA GLUCOSE: 174 mg/dL

## 2015-05-05 LAB — CBC
HCT: 40.8 % (ref 36.0–46.0)
HEMOGLOBIN: 12.4 g/dL (ref 12.0–15.0)
MCH: 29.4 pg (ref 26.0–34.0)
MCHC: 30.4 g/dL (ref 30.0–36.0)
MCV: 96.7 fL (ref 78.0–100.0)
Platelets: 215 10*3/uL (ref 150–400)
RBC: 4.22 MIL/uL (ref 3.87–5.11)
RDW: 14.5 % (ref 11.5–15.5)
WBC: 7.1 10*3/uL (ref 4.0–10.5)

## 2015-05-05 LAB — BASIC METABOLIC PANEL
ANION GAP: 8 (ref 5–15)
CHLORIDE: 92 mmol/L — AB (ref 101–111)
CO2: 39 mmol/L — ABNORMAL HIGH (ref 22–32)
Calcium: 8.6 mg/dL — ABNORMAL LOW (ref 8.9–10.3)
Creatinine, Ser: 0.81 mg/dL (ref 0.44–1.00)
Glucose, Bld: 168 mg/dL — ABNORMAL HIGH (ref 65–99)
POTASSIUM: 4 mmol/L (ref 3.5–5.1)
SODIUM: 139 mmol/L (ref 135–145)

## 2015-05-05 LAB — HIV ANTIBODY (ROUTINE TESTING W REFLEX): HIV Screen 4th Generation wRfx: NONREACTIVE

## 2015-05-05 MED ORDER — TRIAMCINOLONE 0.1 % CREAM:EUCERIN CREAM 1:1
TOPICAL_CREAM | Freq: Two times a day (BID) | CUTANEOUS | Status: DC
  Administered 2015-05-05 – 2015-05-06 (×3): via TOPICAL
  Filled 2015-05-05: qty 1

## 2015-05-05 NOTE — Progress Notes (Signed)
Notified by CMT that patient had 6-7 beats of VTach. Checked on patient and patient was getting up to the bathroom. Patient was asymptomatic.Notified hospitalist via text page. Will continue to monitor to end of shift.

## 2015-05-05 NOTE — Progress Notes (Signed)
Subjective: Patient seen and examined this morning.  No acute events since admission.  She reports her legs are feeling better this morning.  Objective: Vital signs in last 24 hours: Filed Vitals:   05/04/15 2140 05/05/15 0025 05/05/15 0534 05/05/15 0740  BP: 103/55 97/56 100/54 117/61  Pulse: 95 84 81 81  Temp: 98.2 F (36.8 C) 97.9 F (36.6 C) 98 F (36.7 C) 98 F (36.7 C)  TempSrc: Oral Oral Oral Oral  Resp:  _0 Height:      Weight:   237 lb 6.4 oz (107.684 kg)   SpO2: 97% 94% 96% 94%   Weight change:   Intake/Output Summary (Last 24 hours) at 05/05/15 1339 Last data filed at 05/05/15 0908  Gross per 24 hour  Intake    360 ml  Output   2400 ml  Net  -2040 ml   General: resting in bed, no distress HEENT: PERRL, EOMI, no scleral icterus Cardiac: RRR, no rubs, murmurs or gallops Pulm: decreased air movement bilaterally, no increased work of breathing Abd: soft, nontender, nondistended, BS present Ext: 2+ pitting edema of bilateral lower extremities with warmth and erythema.  Overall improved from admission Neuro: alert and oriented X3, cranial nerves II-XII grossly intact  Lab Results: Basic Metabolic Panel:  Recent Labs Lab 05/04/15 1453 05/05/15 0421  NA 141 139  K 3.8 4.0  CL 95* 92*  CO2 39* 39*  GLUCOSE 137* 168*  BUN <5* <5*  CREATININE 0.79 0.81  CALCIUM 9.2 8.6*   Liver Function Tests:  Recent Labs Lab 05/04/15 1453  AST 19  ALT 20  ALKPHOS 180*  BILITOT 0.4  PROT 6.4*  ALBUMIN 2.7*   No results for input(s): LIPASE, AMYLASE in the last 168 hours. No results for input(s): AMMONIA in the last 168 hours. CBC:  Recent Labs Lab 05/04/15 1453 05/05/15 0421  WBC 8.8 7.1  HGB 12.5 12.4  HCT 40.5 40.8  MCV 96.7 96.7  PLT 218 215   Cardiac Enzymes:  Recent Labs Lab 05/04/15 1453  TROPONINI <0.03   BNP: No results for input(s): PROBNP in the last 168 hours. D-Dimer: No results for input(s): DDIMER in the last 168  hours. CBG: No results for input(s): GLUCAP in the last 168 hours. Hemoglobin A1C:  Recent Labs Lab 05/04/15 1453  HGBA1C 7.7*   Fasting Lipid Panel: No results for input(s): CHOL, HDL, LDLCALC, TRIG, CHOLHDL, LDLDIRECT in the last 168 hours. Thyroid Function Tests:  Recent Labs Lab 05/04/15 1453  TSH 3.879   Coagulation: No results for input(s): LABPROT, INR in the last 168 hours. Anemia Panel: No results for input(s): VITAMINB12, FOLATE, FERRITIN, TIBC, IRON, RETICCTPCT in the last 168 hours. Urine Drug Screen: Drugs of Abuse     Component Value Date/Time   LABOPIA NONE DETECTED 07/01/2013 1218   LABOPIA NEGATIVE 02/04/2009 2315   COCAINSCRNUR NONE DETECTED 07/01/2013 1218   COCAINSCRNUR NEGATIVE 02/04/2009 2315   LABBENZ NONE DETECTED 07/01/2013 1218   LABBENZ NEGATIVE 02/04/2009 2315   AMPHETMU NONE DETECTED 07/01/2013 1218   AMPHETMU NEGATIVE 02/04/2009 2315   THCU NONE DETECTED 07/01/2013 1218   LABBARB NONE DETECTED 07/01/2013 1218    Alcohol Level: No results for input(s): ETH in the last 168 hours. Urinalysis: No results for input(s): COLORURINE, LABSPEC, PHURINE, GLUCOSEU, HGBUR, BILIRUBINUR, KETONESUR, PROTEINUR, UROBILINOGEN, NITRITE, LEUKOCYTESUR in the last 168 hours.  Invalid input(s): APPERANCEUR Misc. Labs:   Micro Results: No results found for this or any previous visit (  from the past 240 hour(s)). Studies/Results: Dg Chest 2 View  05/04/2015  CLINICAL DATA:  Volume overload.  History of COPD. EXAM: CHEST  2 VIEW COMPARISON:  April 12, 2015 FINDINGS: The heart size and mediastinal contours are stable. The heart size is enlarged. There is patchy opacity of bilateral lung bases. There is mild increased pulmonary interstitium bilaterally. The visualized skeletal structures are stable. IMPRESSION: Patchy consolidation of both lung bases suspicious for pneumonia. Increased pulmonary interstitium bilaterally consistent with mild pulmonary interstitial  edema. Electronically Signed   By: Abelardo Diesel M.D.   On: 05/04/2015 14:13   Medications:  Scheduled Meds: . aspirin  81 mg Oral QHS  . furosemide  80 mg Intravenous 3 times per day  . gabapentin  300 mg Oral QHS  . heparin  5,000 Units Subcutaneous 3 times per day  . pantoprazole  40 mg Oral Daily  . potassium chloride  40 mEq Oral BID  . pravastatin  40 mg Oral q1800  . sodium chloride flush  3 mL Intravenous Q12H  . triamcinolone 0.1 % cream : eucerin   Topical BID   Continuous Infusions:  PRN Meds:.acetaminophen, albuterol, dextromethorphan, ipratropium-albuterol Assessment/Plan: Principal Problem:   Acute exacerbation of congestive heart failure (HCC) Active Problems:   Body mass index (BMI) 40.0-44.9, adult (HCC)   Tobacco abuse   Coronary atherosclerosis   Pulmonary hypertension (HCC)   Severe chronic obstructive pulmonary disease (HCC)   Gastroesophageal reflux disease   Hyperlipidemia   Diastolic CHF ([place])   Primary adenocarcinoma of left lung ([place])  60 y.o. female with past medical history of stage 2 NSCLC (completed radiation therapy on 02/25/2015), COPDn stage 3D on home oxygen requirement of 4L via [place] (FEV1 42%, FEV1/FVC 88% on 12/02/2014), pulmonary hypertension, CAD status post DES 2010, congestive heart failure with EF 50-55%, and vitamin B12 deficiency who presents as a direct admit from the Internal Medicine Clinic due to increased lower extremity swelling and pain.  Lipodermatosclerosis: without ulceration and likely due to chronic venous insufficiency in setting of sleeping upright in a recliner.  Her normal BNP and CXR do not suggest an acute exacerbation of her chronic heart failure.  She reports sleeping in a recliner at home due to not having an available bed.  She maintains that she has been compliant with her Lasix at home. - continue diuresis with Lasix 58m TID - monitor BMP on diuresis - elevate legs - TED hose - do not suspect cellulitis so not  doing antibiotics - CSW consult for living situation  Chronic HFpEF: increased peripheral edema, increased weight gain.  Her weight this morning is back to her baseline at last discharge.  Normal BNP and her CXR do not suggest acute exacerbation of her heart failure.  Her last Echo was in February 2016 which showed preserved EF and grade 1 diastolic dysfunction.  - TSH normal - follow BMP - TED hose, elevate legs - daily weights, I/O's - Lasix 843mIV TID - K-Dur 4025mBID  COPD Gold Stage 3D: patient with mild decrease in her baseline dyspnea. Chronic cough that is mildly productive, afebrile. She continues to smoke. Do not suspect an exacerbation of her chronic lung disease or pneumonia in recently hospitalized patient.  - albuterol nebulizers PRN - duonebs Q4H PRN - dextromethorphan BID PRN. Would try to avoid cough medications with opiate to avoid further depressing her already poor respiratory drive - supplemental oxygen  Health Maintenance: - check A1C, HIV   FEN Fluids: none  Electrolytes: K-dur 70mq BID and monitor lytes Nutrition: heart healthy  DVT PPx: Heparin SQ  CODE: FULL   Dispo: Disposition is deferred at this time, awaiting improvement of current medical problems.   The patient does have a current PCP (Oval Linsey MD) and [facility].  The patient does have transportation limitations that hinder transportation to clinic appointments.  .Services Needed at time of discharge: Y = Yes, Blank = No PT:   OT:   RN:   Equipment:   Other:     LOS: 1 day   AJule Ser DO 05/05/2015, 1:39 PM

## 2015-05-05 NOTE — Progress Notes (Signed)
Patient had some concern of getting lasix at night and also getting Potassium. Explained to patient that she is ordered lasix three times a day and potassium twice daily. The patient was not fond of the orders written for she did not understand why lasix is given in the evening when it was given during the day. Patient also wanted left upper side rail down. Explained to patient that per policy and safety reasons we are to have both upper rails up. Patient understood but still wanted her left upper side rail down. Will continue to monitor patient to end of shift.

## 2015-05-05 NOTE — Progress Notes (Signed)
Utilization review completed. Ari Bernabei, RN, BSN. 

## 2015-05-06 LAB — BASIC METABOLIC PANEL
ANION GAP: 8 (ref 5–15)
BUN: 8 mg/dL (ref 6–20)
CO2: 41 mmol/L — AB (ref 22–32)
Calcium: 8.8 mg/dL — ABNORMAL LOW (ref 8.9–10.3)
Chloride: 87 mmol/L — ABNORMAL LOW (ref 101–111)
Creatinine, Ser: 0.88 mg/dL (ref 0.44–1.00)
GFR calc Af Amer: >60 mL/min (ref >60)
GFR calc non Af Amer: >60 mL/min (ref >60)
GLUCOSE: 256 mg/dL — AB (ref 65–99)
POTASSIUM: 4.2 mmol/L (ref 3.5–5.1)
Sodium: 136 mmol/L (ref 135–145)

## 2015-05-06 MED ORDER — FUROSEMIDE 40 MG PO TABS: 40.0000 mg | ORAL_TABLET | Freq: Two times a day (BID) | ORAL | Status: DC

## 2015-05-06 MED ORDER — TRIAMCINOLONE 0.1 % CREAM:EUCERIN CREAM 1:1: 1.0000 "application " | TOPICAL_CREAM | Freq: Two times a day (BID) | CUTANEOUS | Status: DC

## 2015-05-06 NOTE — Progress Notes (Signed)
Inpatient Diabetes Program Recommendations  AACE/ADA: New Consensus Statement on Inpatient Glycemic Control (2015)  Target Ranges:  Prepandial:   less than 140 mg/dL      Peak postprandial:   less than 180 mg/dL (1-2 hours)      Critically ill patients:  140 - 180 mg/dL   Review of Glycemic Control:  Results for SHAMEKA, AGGARWAL (MRN [id number]) as of 05/06/2015 10:26  Ref. Range 05/04/2015 14:53 05/05/2015 04:21 05/06/2015 04:20  Glucose Latest Ref Range: 65-99 mg/dL 137 (H) 168 (H) 256 (H)  Results for VERTIE, DIBBERN (MRN [id number]) as of 05/06/2015 10:26  Ref. Range 05/04/2015 14:53  Hemoglobin A1C Latest Ref Range: 4.8-5.6 % 7.7 (H)    Diabetes history: Pre-Diabetes since 2010 Outpatient Diabetes medications: None Current orders for Inpatient glycemic control: None  Inpatient Diabetes Program Recommendations:    Note that lab glucose elevated this morning.  Also, A1C is 7.7%.  Could this be new diagnosis of diabetes?  Please consider ordering CBG's tid with meals and HS with sensitive Novolog correction.  If this is new diagnosis, patient will need "Survival Skill education" while in the hospital and dietician consult.  Note that patient was recently on Prednisone taper prior to admit also?  Will follow.  Thanks, Adah Perl, RN, BC-ADM Inpatient Diabetes Coordinator Pager [phone] (8a-5p)

## 2015-05-06 NOTE — Progress Notes (Signed)
Orthopedic Tech Progress Note Patient Details:  [name] [birth year] [id number]  Ortho Devices Type of Ortho Device: Louretta Parma boot Ortho Device/Splint Location: bilateral Ortho Device/Splint Interventions: Application   Hildred Priest 05/06/2015, 12:13 PM

## 2015-05-06 NOTE — Care Management Note (Signed)
Case Management Note  Patient Details  Name: [name] MRN: [id number] Date of Birth: March 26, 1956  Subjective/Objective:       Admitted with CHF             Action/Plan: Patient recently moved in with a friend due to her home / apt being condemned; She is on the waiting list for housing - [employer], they have availability but patient needs to be on the ground floor. Patient stated that her family members will help her move. She has home oxygen and a walker that she use when she leaves her home. Has private insurance with [facility] Medicare with prescription drug coverage and also in the [employer] program to assist with her medication. Has scales at home but they are packed up.  Very difficult situation- CM can arrange for a hospital bed but there is no room in her current friends home. Patient sleeps in a recliner and stated that she will continue to sleep in that until she gets her apt which hopefully will be soon. She sees Dr Joya Gaskins Pulmonary and Dr Percival [facility]. [facility] is following also. CM will continue to follow for DCP.  Expected Discharge Date:   possibly 05/10/2015               Expected Discharge Plan:  Home/Self Care  Discharge planning Services  CM Consult  Status of Service:  In process, will continue to follow  Sherrilyn Rist [phone] 05/06/2015, 9:50 AM

## 2015-05-06 NOTE — Progress Notes (Addendum)
Heart Failure Navigator Consult Note  Presentation: [name]  is a 60 y.o. female with past medical history of stage 2 NSCLC (completed radiation therapy on 02/25/2015), COPDn stage 3D on home oxygen requirement of 4L via [place] (FEV1 42%, FEV1/FVC 88% on 12/02/2014), pulmonary hypertension, CAD status post DES 2010, congestive heart failure with EF 50-55%, BPPV and vitamin B12 deficiency who presents as a direct admit from the Internal Medicine Clinic this afternoon due to increased lower extremity swelling and pain.  Patient reports that she has had increased lower extremity swelling and erythema of her bilateral lower extremities for past week. She reports compliance with her home Lasix dose of 56m daily since her most recent hospital discharge. She recently increased this to 844mdaily after calling the clinic on 1/27. She reports watching her salt intake but otherwise has not been restricting her fluid intake. She has been sleeping in a recliner due to living situation. Patient reports initially noticing that her feet and ankles were red and this then spread to include her shins 3 days ago. Again, this remained bilateral. Patient now states it has gone to her thighs. This has coincided with increased swelling. No drainage or trauma to the area although patient reports feeling that the bottoms of her feet have felt moist. With this increased swelling and erythema, patient also reports new pain for the past 4 days described as throbbing and tingling. She has attempted pain control with Tylenol and Aleve without relief. She has minimal worsening of her baseline dyspnea. Currently on 4.5L via nasal cannula whereas her home requirement is 4L. She denies chest pain. She reports ongoing cough that frequently keeps her up at night. Mildly productive. Denies fevers but says she has had some chills. No nausea or vomiting, no myalgias, no sore throat, no sinus congestion or rhinorrhea.  Her  weight in clinic today is back up to 253 pounds. At last discharge, we had diuresed her to 241 pounds (which is close to her baseline). Admission to hospital weight is 247. She was seen in clinic on 04/16/2015 and weighed 243 pounds.  Past Medical History  Diagnosis Date  . Coronary artery disease     S/P PCI of LAD with DES (12/2008). Total occlusion of RCA noted at that time., medically managed. ACS ruled out 03/2009 with Lexiscan myoview . Followed by [facility].  . [place]    2-D Echo (0940/1027- Systolic pressure was moderately increased. PA peak pressure  5943m. 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 Followed by Dr. WriJoya GaskinseVelora Heckler. Diastolic dysfunction     2-D Echo (12/2008) - Normal LV Systolic funciton with EF 60-65%. Grade 1 diastolid dysfunction. No regional wall motion abnormalities. Moderate pulmonary HTN with PA peak pressure 16m47m  . COMarland KitchenD (chronic obstructive pulmonary disease) (HCC)     Severe. Gold Stage IV.  PFTs (12/2008) - severe obstructive airway disease. Active tobacco use. Requires 4L O2 at home.  . Pulmonary nodule, right     Small right middle lobe nodule. Stable as of 12/2008.  . PrMarland Kitchendiabetes     HgbA1c 6.4 (12/2008)  . Hx MRSA infection     Recurrent MRSA thigh abscesses.  . Obesity   . Hyperlipidemia   . GERD (gastroesophageal reflux disease)     S/P Nissen fundoplication.  . CHF (congestive heart failure) (HCC)[place]. On home oxygen therapy  since 2010    4L all the time  . Shortness of breath dyspnea     with a lot of exertion; if fluid builds up  . Pneumonia   . Depression   . Full dentures   . Chronic bronchitis ([place])     "get it most years" (03/03/2015)  . Lung cancer ([place])     "left"  . History of hiatal hernia   . Arthritis     "some scattered" (03/03/2015)    Social History   Social History  . Marital Status:  Divorced    Spouse Name: N/A  . Number of Children: N/A  . Years of Education: N/A   Social History Main Topics  . Smoking status: Current Every Day Smoker -- 0.10 packs/day for 45 years    Types: Cigarettes    Start date: 04/04/1969  . Smokeless tobacco: Never Used     Comment: 1 cig per day  . Alcohol Use: No  . Drug Use: No  . Sexual Activity: Not Currently    Birth Control/ Protection: None   Other Topics Concern  . None   Social History Narrative   Formerly worked as a Scientist, water quality, now disabled.   Divorced.   2 grown children.   Lives with her grandson.    ECHO:Study Conclusions-05/25/14  - Left ventricle: The cavity size was normal. Wall thickness was increased in a pattern of mild LVH. Systolic function was normal. The estimated ejection fraction was in the range of 50% to 55%. Regional wall motion abnormalities cannot be excluded. Doppler parameters are consistent with abnormal left ventricular relaxation (grade 1 diastolic dysfunction). - Aortic valve: Valve area (Vmax): 2.59 cm^2. - Left atrium: The atrium was mildly dilated. - Right atrium: The atrium was mildly dilated. - Pericardium, extracardiac: A trivial pericardial effusion was identified posterior to the heart.  Transthoracic echocardiography. M-mode, complete 2D, spectral Doppler, and color Doppler. Birthdate: Patient birthdate: [birth year]. Age: Patient is 60 yr old. Sex: Gender: female. BMI: 41.1 kg/m^2. Blood pressure:   129/68 Patient status: Inpatient. Study date: Study date: 05/25/2014. Study time: 03:05 PM.  BNP    Component Value Date/Time   BNP 75.0 05/04/2015 1453    ProBNP    Component Value Date/Time   PROBNP 400.4* 03/18/2014 1840     Education Assessment and Provision:  Detailed education and instructions provided on heart failure disease management including the following:  Signs and symptoms of Heart Failure When to call the physician Importance of  daily weights Low sodium diet Fluid restriction Medication management Anticipated future follow-up appointments  Patient education given on each of the above topics.  Patient acknowledges understanding and acceptance of all instructions.  I spoke with Ms. Brester regarding her HF.  She tells me that she has a scale that is packed up in her belongings and will not be able to be accessed until she unpacks (when she moves into an apt.)  I have stressed the importance of daily weights and how weight gains relate to the signs and symptoms of HF.  I have provided her a scale for daily use.  She admits that she is eating "what I can get" right now due to living and financial situation.  I reinforced a low sodium diet as well as high sodium foods to avoid.  She does tell me that she "never uses" white table salt.   She denies any issues with getting or taking prescribed medications however has been an issue in the past according  to chart.    I have referred her to the [facility] Clinic as she has been admitted 6/86moand is at high risk for readmission.  She has an appt on Wed Jan 8 at 10:00 am in the [facility] Clinic.  I also plan to refer to the AHF Clinic Outpatient LCSW for additional housing and financial resources well as the HF Paramedicine Program.     Education Materials:  "Living Better With Heart Failure" Booklet, Daily Weight Tracker Tool    High Risk Criteria for Readmission and/or Poor Patient Outcomes:   EF <30%- no 50-55% with grade 1 dias dys  2 or more admissions in 6 months- yes 6/631moDifficult social situation- yes--currently living with friends--all of her belongings "packed up" and she is sleeping in a chair.  Demonstrates medication noncompliance-  Denies ?    Barriers of Care:  Knowledge,compliance and social situation  Discharge Planning:   Plans to return to home with "friends".  She is awaiting someone to move out from an apt so she can move in.  She will benefit from HF Paramedicine  program for ongoing support due to ongoing risk for readmission.

## 2015-05-06 NOTE — Progress Notes (Signed)
Pt discharged home, Given discharge instructions, verblized understanding. Taken out via wheelchair.

## 2015-05-06 NOTE — Progress Notes (Signed)
Subjective: Patient seen and examined this morning.  No acute events overnight.  Her leg pain and swelling is doing better.  She reports being ready to go home.  We will have orthopedics see her to apply wraps to legs and arrange for home health services.  Objective: Vital signs in last 24 hours: Filed Vitals:   05/05/15 0740 05/05/15 1947 05/06/15 0542 05/06/15 1148  BP: 117/61 98/48 102/56 103/62  Pulse: 81 76 89 72  Temp: 98 F (36.7 C) 99.1 F (37.3 C) 99 F (37.2 C) 98.3 F (36.8 C)  TempSrc: Oral Oral Oral Oral  Resp: _0 Height:      Weight:   235 lb 8 oz (106.822 kg)   SpO2: 94% 96% 91% 93%   Weight change: -12 lb 3.2 oz (-5.534 kg)  Intake/Output Summary (Last 24 hours) at 05/06/15 1211 Last data filed at 05/06/15 0957  Gross per 24 hour  Intake    960 ml  Output   2850 ml  Net  -1890 ml   General: resting in bed, no distress HEENT: PERRL, EOMI, no scleral icterus Cardiac: RRR, no rubs, murmurs or gallops Pulm: decreased air movement bilaterally, no increased work of breathing Abd: soft, nontender, nondistended Ext: 2+ pitting edema of bilateral lower extremities with mild erythema.  Neuro: alert and oriented X3, no focal deficits  Lab Results: Basic Metabolic Panel:  Recent Labs Lab 05/05/15 0421 05/06/15 0420  NA 139 136  K 4.0 4.2  CL 92* 87*  CO2 39* 41*  GLUCOSE 168* 256*  BUN <5* 8  CREATININE 0.81 0.88  CALCIUM 8.6* 8.8*   Liver Function Tests:  Recent Labs Lab 05/04/15 1453  AST 19  ALT 20  ALKPHOS 180*  BILITOT 0.4  PROT 6.4*  ALBUMIN 2.7*   No results for input(s): LIPASE, AMYLASE in the last 168 hours. No results for input(s): AMMONIA in the last 168 hours. CBC:  Recent Labs Lab 05/04/15 1453 05/05/15 0421  WBC 8.8 7.1  HGB 12.5 12.4  HCT 40.5 40.8  MCV 96.7 96.7  PLT 218 215   Cardiac Enzymes:  Recent Labs Lab 05/04/15 1453  TROPONINI <0.03   BNP: No results for input(s): PROBNP in the last 168  hours. D-Dimer: No results for input(s): DDIMER in the last 168 hours. CBG: No results for input(s): GLUCAP in the last 168 hours. Hemoglobin A1C:  Recent Labs Lab 05/04/15 1453  HGBA1C 7.7*   Fasting Lipid Panel: No results for input(s): CHOL, HDL, LDLCALC, TRIG, CHOLHDL, LDLDIRECT in the last 168 hours. Thyroid Function Tests:  Recent Labs Lab 05/04/15 1453  TSH 3.879   Coagulation: No results for input(s): LABPROT, INR in the last 168 hours. Anemia Panel: No results for input(s): VITAMINB12, FOLATE, FERRITIN, TIBC, IRON, RETICCTPCT in the last 168 hours. Urine Drug Screen: Drugs of Abuse     Component Value Date/Time   LABOPIA NONE DETECTED 07/01/2013 1218   LABOPIA NEGATIVE 02/04/2009 2315   COCAINSCRNUR NONE DETECTED 07/01/2013 1218   COCAINSCRNUR NEGATIVE 02/04/2009 2315   LABBENZ NONE DETECTED 07/01/2013 1218   LABBENZ NEGATIVE 02/04/2009 2315   AMPHETMU NONE DETECTED 07/01/2013 1218   AMPHETMU NEGATIVE 02/04/2009 2315   THCU NONE DETECTED 07/01/2013 1218   LABBARB NONE DETECTED 07/01/2013 1218    Alcohol Level: No results for input(s): ETH in the last 168 hours. Urinalysis: No results for input(s): COLORURINE, LABSPEC, PHURINE, GLUCOSEU, HGBUR, BILIRUBINUR, KETONESUR, PROTEINUR, UROBILINOGEN, NITRITE, LEUKOCYTESUR in the last 168  hours.  Invalid input(s): APPERANCEUR Misc. Labs:   Micro Results: No results found for this or any previous visit (from the past 240 hour(s)). Studies/Results: Dg Chest 2 View  05/04/2015  CLINICAL DATA:  Volume overload.  History of COPD. EXAM: CHEST  2 VIEW COMPARISON:  April 12, 2015 FINDINGS: The heart size and mediastinal contours are stable. The heart size is enlarged. There is patchy opacity of bilateral lung bases. There is mild increased pulmonary interstitium bilaterally. The visualized skeletal structures are stable. IMPRESSION: Patchy consolidation of both lung bases suspicious for pneumonia. Increased pulmonary  interstitium bilaterally consistent with mild pulmonary interstitial edema. Electronically Signed   By: Abelardo Diesel M.D.   On: 05/04/2015 14:13   Medications:  Scheduled Meds: . aspirin  81 mg Oral QHS  . furosemide  80 mg Intravenous 3 times per day  . gabapentin  300 mg Oral QHS  . heparin  5,000 Units Subcutaneous 3 times per day  . pantoprazole  40 mg Oral Daily  . potassium chloride  40 mEq Oral BID  . pravastatin  40 mg Oral q1800  . sodium chloride flush  3 mL Intravenous Q12H  . triamcinolone 0.1 % cream : eucerin   Topical BID   Continuous Infusions:  PRN Meds:.acetaminophen, albuterol, dextromethorphan, ipratropium-albuterol Assessment/Plan: Principal Problem:   Acute exacerbation of congestive heart failure (HCC) Active Problems:   Body mass index (BMI) 40.0-44.9, adult (HCC)   Tobacco abuse   Coronary atherosclerosis   Pulmonary hypertension (HCC)   Severe chronic obstructive pulmonary disease (HCC)   Gastroesophageal reflux disease   Hyperlipidemia   Diastolic CHF ([place])   Primary adenocarcinoma of left lung (HCC)   Volume overload   Venous (peripheral) insufficiency  60 y.o. female with past medical history of stage 2 NSCLC (completed radiation therapy on 02/25/2015), COPDn stage 3D on home oxygen requirement of 4L via [place] (FEV1 42%, FEV1/FVC 88% on 12/02/2014), pulmonary hypertension, CAD status post DES 2010, congestive heart failure with EF 50-55%, and vitamin B12 deficiency who presents as a direct admit from the Internal Medicine Clinic due to increased lower extremity swelling and pain.  Lipodermatosclerosis: without ulceration and likely due to chronic venous insufficiency in setting of sleeping upright in a recliner.  Her normal BNP and CXR do not suggest an acute exacerbation of her chronic heart failure.  She reports sleeping in a recliner at home due to not having an available bed.  She maintains that she has been compliant with her Lasix at home. - elevate  legs - continue triamcinolone and eucerin cream - place Unna boot prior to discharge, arrange home health - do not suspect cellulitis so not doing antibiotics  Chronic HFpEF: increased peripheral edema, increased weight gain.  Her weight this morning is back to her baseline at last discharge.  Normal BNP and her CXR do not suggest acute exacerbation of her heart failure.  Her last Echo was in February 2016 which showed preserved EF and grade 1 diastolic dysfunction.  - discharge on home diuretics and potassium supplementation  COPD Gold Stage 3D: patient with mild decrease in her baseline dyspnea. Chronic cough that is mildly productive, afebrile. She continues to smoke. Do not suspect an exacerbation of her chronic lung disease or pneumonia in recently hospitalized patient.  - albuterol nebulizers PRN - duonebs Q4H PRN - dextromethorphan BID PRN. Would try to avoid cough medications with opiate to avoid further depressing her already poor respiratory drive - supplemental oxygen  Newly Diagnosed DM2: -  A1C of 7.7, will have patient follow up as outpatient.  FEN Fluids: none Electrolytes: K-dur 81mq BID and monitor lytes Nutrition: heart healthy  DVT PPx: Heparin SQ  CODE: FULL   Dispo: Discharge home today.   The patient does have a current PCP (Oval Linsey MD) and [facility].  The patient does have transportation limitations that hinder transportation to clinic appointments.  .Services Needed at time of discharge: Y = Yes, Blank = No PT:   OT:   RN:  yes  Equipment:   Other:     LOS: 2 days   AJule Ser DO 05/06/2015, 12:11 PM

## 2015-05-06 NOTE — Telephone Encounter (Signed)
Review as speaking w/ HHN

## 2015-05-06 NOTE — Discharge Summary (Signed)
Name: [name] MRN: [id number] DOB: [birth year] 60 y.o. PCP: Oval Linsey, MD  Date of Admission: 05/04/2015 12:58 PM Date of Discharge: 05/06/2015 Attending Physician: Annia Belt, MD  Discharge Diagnosis: 1. Lipodermatosclerosis 2.. Chronic HFpEF 3.  COPD Stage IIID 4.  Newly diagnosed type II DM  Principal Problem:   Acute exacerbation of congestive heart failure (HCC) Active Problems:   Body mass index (BMI) 40.0-44.9, adult (HCC)   Tobacco abuse   Coronary atherosclerosis   Pulmonary hypertension (HCC)   Severe chronic obstructive pulmonary disease (HCC)   Gastroesophageal reflux disease   Hyperlipidemia   Diastolic CHF ([place])   Primary adenocarcinoma of left lung (HCC)   Volume overload   Venous (peripheral) insufficiency  Discharge Medications:   Medication List    ASK your doctor about these medications        albuterol (2.5 MG/3ML) 0.083% nebulizer solution  Commonly known as:  PROVENTIL  Take 3 mLs (2.5 mg total) by nebulization every 6 (six) hours as needed for shortness of breath.     albuterol 108 (90 Base) MCG/ACT inhaler  Commonly known as:  VENTOLIN HFA  Inhale 2 puffs into the lungs every 6 (six) hours as needed for shortness of breath.     ALEVE 220 MG tablet  Generic drug:  naproxen sodium  Take 220 mg by mouth 2 (two) times daily as needed (pain).     aspirin 81 MG chewable tablet  Chew 1 tablet (81 mg total) by mouth at bedtime.     BC HEADACHE POWDER PO  Take 1 packet by mouth 3 (three) times daily as needed (pain).     benzonatate 100 MG capsule  Commonly known as:  TESSALON  Take 1 capsule (100 mg total) by mouth 2 (two) times daily.     dextromethorphan 30 MG/5ML liquid  Commonly known as:  DELSYM  Take 5 mLs (30 mg total) by mouth 2 (two) times daily as needed for cough.     Fluticasone-Salmeterol 250-50 MCG/DOSE Aepb  Commonly known as:  ADVAIR DISKUS  Inhale 1 puff into the lungs 2 (two) times daily.     furosemide 40 MG tablet  Commonly known as:  LASIX  Take 1 tablet (40 mg total) by mouth daily.     gabapentin 300 MG capsule  Commonly known as:  NEURONTIN  Take 1 capsule (300 mg total) by mouth 3 (three) times daily.     guaiFENesin 600 MG 12 hr tablet  Commonly known as:  MUCINEX  Take 1 tablet (600 mg total) by mouth 2 (two) times daily.     lovastatin 40 MG tablet  Commonly known as:  MEVACOR  Take 1 tablet (40 mg total) by mouth at bedtime.     OXYGEN  Inhale 4.5 L into the lungs continuous.     pantoprazole 40 MG tablet  Commonly known as:  PROTONIX  Take 1 tablet (40 mg total) by mouth daily.     potassium chloride SA 20 MEQ tablet  Commonly known as:  K-DUR,KLOR-CON  Take 2 tablets (40 mEq total) by mouth daily as needed (Take when taking lasix).     tiotropium 18 MCG inhalation capsule  Commonly known as:  SPIRIVA HANDIHALER  Place 1 capsule (18 mcg total) into inhaler and inhale daily.        Disposition and follow-up:   Ms.Zafirah B Laskin was discharged from [facility] in Stable condition.    At the hospital follow  up visit please address:  - leg swelling with Unna boots and triamcinolone/eucerin cream.  Has home health contacted her?  Is she still sleeping in a recliner? - compliance with Lasix - how's her breathing? - did she ever get her bedside commode? - any update on housing situation?  May need to get social work involved - has she been able to reschedule with her oncologist? - her newly diagnosed diabetes and consider treatment options with her  2.  Labs / imaging needed at time of follow-up: none  3.  Pending labs/ test needing follow-up: none  Follow-up Appointments:   Discharge Instructions:   Consultations:    Procedures Performed:  Dg Chest 2 View  05/04/2015  CLINICAL DATA:  Volume overload.  History of COPD. EXAM: CHEST  2 VIEW COMPARISON:  April 12, 2015 FINDINGS: The heart size and mediastinal contours are  stable. The heart size is enlarged. There is patchy opacity of bilateral lung bases. There is mild increased pulmonary interstitium bilaterally. The visualized skeletal structures are stable. IMPRESSION: Patchy consolidation of both lung bases suspicious for pneumonia. Increased pulmonary interstitium bilaterally consistent with mild pulmonary interstitial edema. Electronically Signed   By: Abelardo Diesel M.D.   On: 05/04/2015 14:13   Dg Chest 2 View  04/12/2015  CLINICAL DATA:  COPD exacerbation. Shortness of breath, cough, and chest pain. EXAM: CHEST  2 VIEW COMPARISON:  04/10/2015 FINDINGS: Cardiac silhouette remains enlarged. The lungs are hyperinflated. Mild diffuse interstitial prominence is unchanged. Focal rounded opacity in the left lower lobe (in the region of known lung mass demonstrated on prior CT and PET-CT) persists, with additional patchy opacities in the left lower lobe having slightly improved from yesterday's study. Streaky right basilar opacity is unchanged. No acute osseous abnormality is identified. Numerous surgical clips are noted in the epigastric region. IMPRESSION: Slightly improved aeration of the left lung base, otherwise unchanged examination. Electronically Signed   By: Logan Bores M.D.   On: 04/12/2015 10:45   Dg Chest 2 View  04/10/2015  CLINICAL DATA:  Exacerbation of COPD. EXAM: CHEST  2 VIEW COMPARISON:  03/15/2015 FINDINGS: Dz since prior exam, the patchy airspace consolidation in the left lower lobe has become more focal bordering on the oblique fissure. Interstitial thickening noted more diffusely in the prior study has improved. Lungs remain hyperexpanded. No pleural effusion or pneumothorax. Cardiac silhouette is mildly enlarged. No convincing mediastinal or hilar masses or adenopathy. There are multiple surgical vascular clips in the epigastric region. Bony thorax is grossly intact. IMPRESSION: 1. Improved overall lung aeration when compared to the prior chest radiograph  with breast interstitial thickening. This suggests improved interstitial edema. 2. There is persistent left lower lobe opacity has become more consolidated since the prior CT. Some of this may be due to atelectasis. 3. There are no new areas of lung opacity. Electronically Signed   By: Lajean Manes M.D.   On: 04/10/2015 19:21   Ct Chest W Contrast  04/24/2015  CLINICAL DATA:  Left-sided non-small cell lung cancer diagnosed in summer 2016. Prior radiation therapy. EXAM: CT CHEST WITH CONTRAST TECHNIQUE: Multidetector CT imaging of the chest was performed during intravenous contrast administration. CONTRAST:  4m OMNIPAQUE IOHEXOL 300 MG/ML  SOLN COMPARISON:  Multiple exams, including 02/05/2015 FINDINGS: Mediastinum/Nodes: Right paratracheal node 0.8 cm in short axis on image 12 series 2, formerly 0.9 cm by my measurement. Prevascular node 0.8 cm in short axis on image 17 series 2, formerly 1.0 cm by my measurement. No  significant subcarinal or hilar adenopathy. Mild right heart prominence. Coronary, aortic arch, and branch vessel atherosclerotic vascular disease. Lungs/Pleura: Emphysema. Consolidation in the left lower lobe anteriorly, with truncation of air bronchograms, in the vicinity of the prior left lower lobe mass. Presumably much of this represents radiation pneumonitis although underlying mass within this consolidation is certainly a possibility. The region of consolidation measures approximately 9.4 by 6.1 by 6.9 cm. This is roughly similar in volume to the prior exam. There is some minimal atelectasis anteriorly in the right middle lobe. No new nodule identified. No pleural effusion. Upper abdomen: Postoperative findings at the gastroesophageal junction and upper stomach region. Musculoskeletal: Thoracic spondylosis. IMPRESSION: 1. Nine flow 4 by 6.1 by 6.9 cm region of consolidation in the left lower lobe likely from radiation pneumonitis and obscuring the regional underlying tumor. Difficult to  gauge the size of the underlying tumor. The area of consolidation is roughly similar in volume to the prior exam. 2. Emphysema. 3. Upper normal sized mediastinal lymph nodes without overt adenopathy. 4. Mild right heart prominence. 5. Coronary, aortic arch, and branch vessel atherosclerotic vascular disease. Electronically Signed   By: Van Clines M.D.   On: 04/24/2015 16:16    2D Echo: none  Cardiac Cath: none  Admission HPI:  Ms. HAIVEN NARDONE is a 60 y.o. female with past medical history of stage 2 NSCLC (completed radiation therapy on 02/25/2015), COPDn stage 3D on home oxygen requirement of 4L via [place] (FEV1 42%, FEV1/FVC 88% on 12/02/2014), pulmonary hypertension, CAD status post DES 2010, congestive heart failure with EF 50-55%, BPPV and vitamin B12 deficiency who presents as a direct admit from the Internal Medicine Clinic this afternoon due to increased lower extremity swelling and pain.  Patient reports that she has had increased lower extremity swelling and erythema of her bilateral lower extremities for past week. She reports compliance with her home Lasix dose of 65m daily since her most recent hospital discharge. She recently increased this to 888mdaily after calling the clinic on 1/27. She reports watching her salt intake but otherwise has not been restricting her fluid intake. She has been sleeping in a recliner due to living situation. Patient reports initially noticing that her feet and ankles were red and this then spread to include her shins 3 days ago. Again, this remained bilateral. Patient now states it has gone to her thighs. This has coincided with increased swelling.  No drainage or trauma to the area although patient reports feeling that the bottoms of her feet have felt moist. With this increased swelling and erythema, patient also reports new pain for the past 4 days described as throbbing and tingling. She has attempted pain control with Tylenol and Aleve  without relief. She has minimal worsening of her baseline dyspnea. Currently on 4.5L via nasal cannula whereas her home requirement is 4L. She denies chest pain. She reports ongoing cough that frequently keeps her up at night. Mildly productive. Denies fevers but says she has had some chills. No nausea or vomiting, no myalgias, no sore throat, no sinus congestion or rhinorrhea.  Her weight in clinic today is back up to 253 pounds. At last discharge, we had diuresed her to 241 pounds (which is close to her baseline). Admission to hospital weight is 247. She was seen in clinic on 04/16/2015 and weighed 243 pounds.  Hospital Course by problem list: Principal Problem:   Acute exacerbation of congestive heart failure (HCC) Active Problems:   Body mass index (BMI)  40.0-44.9, adult ([place])   Tobacco abuse   Coronary atherosclerosis   Pulmonary hypertension (HCC)   Severe chronic obstructive pulmonary disease (HCC)   Gastroesophageal reflux disease   Hyperlipidemia   Diastolic CHF ([place])   Primary adenocarcinoma of left lung (HCC)   Volume overload   Venous (peripheral) insufficiency   Lipodermatosclerosis: She was without ulceration and this was likely due to chronic venous insufficiency in setting of sleeping upright in a recliner. Her normal BNP and CXR did not suggest an acute exacerbation of her chronic heart failure. She reports sleeping in a recliner at home due to not having an available bed. She maintains that she has been compliant with her Lasix at home.  We recommended elevating her legs and, if possible, not sleeping in a recliner like she had been doing.  In the hospital, we did not suspect cellulitis so we did not start antibiotics.  We began triamcinolone/eucerin cream which we continued at discharge.  Do to patient being unable to comply with wearing and/or changing her own compression stockings, we arranged for the orthopedic tech to place Unna boots prior to discharge.  We  then arranged for home health services at discharge to help with changing of these boots.  Chronic HFpEF: Her weight was increased at admission and she had peripheral edema.  However, her normal BNP and her CXR do not suggest acute exacerbation of her heart failure. Her last Echo was in February 2016 which showed preserved EF and grade 1 diastolic dysfunction.We continued diuresis while inpatient on Lasix IV three times daily and she ultimately was discharged at 235 pounds, which is about 6 pounds less than her last discharge weight. We discharged on her home diuretics and potassium supplementation  COPD Gold Stage 3D: patient with mild decrease in her baseline dyspnea. Chronic cough that is mildly productive, afebrile. She continues to smoke. Did not suspect an exacerbation of her chronic lung disease or pneumonia in recently hospitalized patient. We treated with albuterol nebulizers PRN, Duonebs Q4H PRN, and dextromethorphan BID PRN, avoiding cough medications with opiates to avoid further depressing her already poor respiratory drive.  We continued supplemental oxygen.  Newly Diagnosed DM2:  A1C of 7.7.  She has received innumerable courses of prednisone over her many hospitalizations.  No new medications were started and will have patient follow up as outpatient.  Discharge Vitals:   BP 103/62 mmHg  Pulse 72  Temp(Src) 98.3 F (36.8 C) (Oral)  Resp 18  Ht 5' 5" (1.651 m)  Wt 235 lb 8 oz (106.822 kg)  BMI 39.19 kg/m2  SpO2 93%  Discharge Labs:  Results for orders placed or performed during the hospital encounter of 05/04/15 (from the past 24 hour(s))  Basic metabolic panel     Status: Abnormal   Collection Time: 05/06/15  4:20 AM  Result Value Ref Range   Sodium 136 135 - 145 mmol/L   Potassium 4.2 3.5 - 5.1 mmol/L   Chloride 87 (L) 101 - 111 mmol/L   CO2 41 (H) 22 - 32 mmol/L   Glucose, Bld 256 (H) 65 - 99 mg/dL   BUN 8 6 - 20 mg/dL   Creatinine, Ser 0.88 0.44 - 1.00 mg/dL    Calcium 8.8 (L) 8.9 - 10.3 mg/dL   GFR calc non Af Amer >60 >60 mL/min   GFR calc Af Amer >60 >60 mL/min   Anion gap 8 5 - 15    Signed: Jule Ser, DO 05/06/2015, 12:19 PM  Services Ordered on Discharge: [place] Ordered on Discharge: unna boots

## 2015-05-06 NOTE — Progress Notes (Signed)
Talked to patient about [place] choices, patient chose [place] for [facility]. Butch Penny with [facility] called for arrangements. Mindi Slicker [facility] [phone]

## 2015-05-07 ENCOUNTER — Other Ambulatory Visit: Payer: Self-pay | Admitting: *Deleted

## 2015-05-07 NOTE — Patient Outreach (Signed)
Transition of care call (week 1, discharged 2/1):   Pt reports was discharged home with [place], [facility] RN suppose to come out to change dressings, have not heard from them yet, has phone number to call if needed.  Pt reports pain is gone. Pt reports she has been compliant with taking her Lasix 40 mg twice a day.   Pt reports hospital  provided her with a scale, did not weigh today.  RN CM reinforced importance of weighing daily, same time  before breakfast to which pt voiced understanding.  Pt reports to f/u with Primary Care MD 2/6 and HF clinic 2/8.   As discussed with pt, plan to f/u 2/9- home visit (part of transition of care).     Zara Chess.   [facility]  [phone]

## 2015-05-08 ENCOUNTER — Telehealth (HOSPITAL_COMMUNITY): Payer: Self-pay | Admitting: Surgery

## 2015-05-08 ENCOUNTER — Ambulatory Visit: Payer: Self-pay | Admitting: *Deleted

## 2015-05-08 ENCOUNTER — Ambulatory Visit: Payer: Commercial Managed Care - HMO | Admitting: Internal Medicine

## 2015-05-08 ENCOUNTER — Telehealth: Payer: Self-pay | Admitting: Internal Medicine

## 2015-05-08 DIAGNOSIS — E119 Type 2 diabetes mellitus without complications: Secondary | ICD-10-CM | POA: Diagnosis not present

## 2015-05-08 DIAGNOSIS — H40013 Open angle with borderline findings, low risk, bilateral: Secondary | ICD-10-CM | POA: Diagnosis not present

## 2015-05-08 DIAGNOSIS — H04123 Dry eye syndrome of bilateral lacrimal glands: Secondary | ICD-10-CM | POA: Diagnosis not present

## 2015-05-08 DIAGNOSIS — H2513 Age-related nuclear cataract, bilateral: Secondary | ICD-10-CM | POA: Diagnosis not present

## 2015-05-08 LAB — HM DIABETES EYE EXAM

## 2015-05-08 NOTE — Telephone Encounter (Signed)
Heart Failure Nurse Navigator Post Discharge Telephone Call  I called to check on [name] after her recent Hospitalization.  She tells me that she has started feeling "some better".  She is concerned because she said she was told to take Lasix 80 mg twice daily by the physician in the hospital and the office called her yesterday and said that she should be taking a "different dose".  In review of the chart---the discharge summary says Lasix 40 mg daily, however med list in computer says Lasix 40 mg twice daily.  She is expecting a call back for clarification from the Internal Medicine Clinic today.  She has not taken ANY Lasix yet today.  She was given a scale by myself during her hospitalization and she tells me that she has not began weighing yet.  I strongly encouraged her to use the scale and begin weighing each day as we discussed in the hospital.  She does admit that prior to admission she had been eating lemons (almost a whole bag) and she adds table salt to these lemons while eating.  I discouraged the use of any table salt and asked her to begin reading labels and restrict sodium in her diet as well.  We discussed high sodium foods to avoid.  She has follow-up appt scheduled next Monday 2/6 with Internal Medicine and 05/13/15 with [facility].  I encouraged her to call me back with any concerns or questions regarding her HF or if she does not hear from the clinic in regards to her Lasix dosage.

## 2015-05-08 NOTE — Telephone Encounter (Signed)
Attempted to call pt back, got vmail, lm for rtc

## 2015-05-08 NOTE — Telephone Encounter (Signed)
Pt requesting the nurse to back regarding Lasix.

## 2015-05-10 DIAGNOSIS — F329 Major depressive disorder, single episode, unspecified: Secondary | ICD-10-CM | POA: Diagnosis not present

## 2015-05-10 DIAGNOSIS — E119 Type 2 diabetes mellitus without complications: Secondary | ICD-10-CM | POA: Diagnosis not present

## 2015-05-10 DIAGNOSIS — K219 Gastro-esophageal reflux disease without esophagitis: Secondary | ICD-10-CM | POA: Diagnosis not present

## 2015-05-10 DIAGNOSIS — I8312 Varicose veins of left lower extremity with inflammation: Secondary | ICD-10-CM | POA: Diagnosis not present

## 2015-05-10 DIAGNOSIS — M150 Primary generalized (osteo)arthritis: Secondary | ICD-10-CM | POA: Diagnosis not present

## 2015-05-10 DIAGNOSIS — J449 Chronic obstructive pulmonary disease, unspecified: Secondary | ICD-10-CM | POA: Diagnosis not present

## 2015-05-10 DIAGNOSIS — I8311 Varicose veins of right lower extremity with inflammation: Secondary | ICD-10-CM | POA: Diagnosis not present

## 2015-05-10 DIAGNOSIS — I2510 Atherosclerotic heart disease of native coronary artery without angina pectoris: Secondary | ICD-10-CM | POA: Diagnosis not present

## 2015-05-10 DIAGNOSIS — I5031 Acute diastolic (congestive) heart failure: Secondary | ICD-10-CM | POA: Diagnosis not present

## 2015-05-11 ENCOUNTER — Encounter: Payer: Self-pay | Admitting: Internal Medicine

## 2015-05-11 ENCOUNTER — Ambulatory Visit: Payer: Commercial Managed Care - HMO | Admitting: Internal Medicine

## 2015-05-11 NOTE — Progress Notes (Signed)
Patient ID: [name], female   DOB: [birth year], 60 y.o.   MRN: [id number]    Advanced Heart Failure Consult Note   Referring Physician: Eppie Gibson Primary Care: Dr Oval Linsey Primary Cardiologist: Previously Dr. Percival Spanish  HPI: [name] is a 60 y.o. female with history of CAD s/p PCI 2010, pulmonary HTN, COPD on 4L via [place] chronically, Chronic diastolic CHF ECHO 0/1779 EF 50-55%, grade 1 DD, obesity, and primary adenocarcinoma of left lung who presents to establish in the HF clinic.   Has had 6 hospital admits in last 6 months for HF and related complaints.   Last amitted 05/04/15 - 05/06/15 for peripheral edema though BNP and CXR not suggestive of acute exacerbation. 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 Discharged with UNNA boots and HH.    She presents today to establish with the HF clinic. Feels stable overall. Weight at home 237. Weight mostly stable. Goes up and down. Pees well on current lasix. SOB stable, long term. Can walk from front of Heart and Vascular building to reception without much trouble. Avoids steps and hills.  Has shower chair, but no real DOE with ADLs. On chronic 02.  Snores loudly, has not had sleep study. Drinks 2 L of mountain dew everyday, doesn't drink much else. Eats pizza, tacos, BBQ regularly. Had hot dogs for dinner last night. + edema and has legs wrapped. No CP.   Still smoking.    Labs 04/16/15   K 4.0, Creatinine 0.71 Labs 05/06/15 K 4.2, Creatinine 0.88  Echo 05/2014 EF 50-55%, grade 1 DD, mild LAE, mild RAE, Normal RV  PFTs 12/02/14 FVC 1.58 (48%) FEV1 1.09 (42%) DLCO 7.91 (32%)  Review of Systems: [y] = yes, _0  = no   General: Weight gain _1 ; Weight loss _2 ; Anorexia _3 ; Fatigue [y]; Fever _4 ; Chills _5 ; Weakness _6   Cardiac: Chest pain/pressure _7 ; Resting SOB _8 ; Exertional SOB [y]; Orthopnea [y]; Pedal Edema [y]; Palpitations _9 ; Syncope _10 ; Presyncope _11 ; Paroxysmal nocturnal dyspnea_12    Pulmonary: Cough [y]; Wheezing_13 ; Hemoptysis_14 ; Sputum [y]; Snoring _15   GI: Vomiting_16 ; Dysphagia_17 ; Melena_18 ; Hematochezia _19 ; Heartburn_20 ; Abdominal pain _21 ; Constipation _22 ; Diarrhea _23 ; BRBPR _24   GU: Hematuria_25 ; Dysuria _26 ; Nocturia_27   Vascular: Pain in legs with walking _28 ; Pain in feet with lying flat _29 ; Non-healing sores _30 ; Stroke _31 ; TIA _32 ; Slurred speech _33 ;  Neuro: Headaches_34 ; Vertigo_35 ; Seizures_36 ; Paresthesias_37 ;Blurred vision _38 ; Diplopia _39 ; Vision changes _40   Ortho/Skin: Arthritis [y]; Joint pain [y]; Muscle pain _41 ; Joint swelling _42 ; Back Pain _43 ; Rash _44   Psych: Depression_45 ; Anxiety_46   Heme: Bleeding problems _47 ; Clotting disorders _48 ; Anemia _49   Endocrine: Diabetes _50 ; Thyroid dysfunction_51    Past Medical History  Diagnosis Date  . Coronary artery disease     S/P PCI of LAD with DES (12/2008). Total occlusion of RCA noted at that time., medically managed. ACS ruled out 03/2009 with Lexiscan myoview . Followed by [facility].  . Pulmonary hypertension ([place])     2-D Echo (39/0300) - Systolic pressure was moderately increased. PA peak pressure  63mHg. secondary pulm htn likely on basis of comb of interstital lung disease, severe copd, small airways disease, severe sleep apnea and  cor pulmonale,. Followed by Dr. Joya Gaskins [facility])  . Diastolic dysfunction     2-D Echo (12/2008) - Normal LV Systolic funciton with EF 60-65%. Grade 1 diastolid dysfunction. No regional wall motion abnormalities. Moderate pulmonary HTN with PA peak pressure 63mHg.  .Marland KitchenCOPD (chronic obstructive pulmonary disease) (HCC)     Severe. Gold Stage IV.  PFTs (12/2008) - severe obstructive airway disease. Active tobacco use. Requires 4L O2 at home.  . Pulmonary nodule, right     Small right middle lobe nodule. Stable as of 12/2008.  .Marland KitchenPrediabetes     HgbA1c 6.4 (12/2008)  . Hx MRSA infection     Recurrent MRSA thigh abscesses.  . Obesity   . Hyperlipidemia   .  GERD (gastroesophageal reflux disease)     S/P Nissen fundoplication.  . CHF (congestive heart failure) ([facility]   . On home oxygen therapy since 2010    4L all the time  . Shortness of breath dyspnea     with a lot of exertion; if fluid builds up  . Pneumonia   . Depression   . Full dentures   . Chronic bronchitis ([facility]     "get it most years" (03/03/2015)  . Lung cancer ([facility]     "left"  . History of hiatal hernia   . Arthritis     "some scattered" (03/03/2015)    Current Outpatient Prescriptions  Medication Sig Dispense Refill  . albuterol (PROVENTIL) (2.5 MG/3ML) 0.083% nebulizer solution Take 3 mLs (2.5 mg total) by nebulization every 6 (six) hours as needed for shortness of breath. 360 mL 3  . albuterol (VENTOLIN HFA) 108 (90 BASE) MCG/ACT inhaler Inhale 2 puffs into the lungs every 6 (six) hours as needed for shortness of breath. 18 g 11  . aspirin 81 MG chewable tablet Chew 1 tablet (81 mg total) by mouth at bedtime. 90 tablet 3  . Aspirin-Salicylamide-Caffeine (BC HEADACHE POWDER PO) Take 1 packet by mouth 3 (three) times daily as needed (pain).    . Fluticasone-Salmeterol (ADVAIR DISKUS) 250-50 MCG/DOSE AEPB Inhale 1 puff into the lungs 2 (two) times daily. 60 each 5  . furosemide (LASIX) 40 MG tablet Take 1 tablet (40 mg total) by mouth 2 (two) times daily. 90 tablet 3  . gabapentin (NEURONTIN) 300 MG capsule Take 300 mg by mouth at bedtime.    . lovastatin (MEVACOR) 40 MG tablet Take 1 tablet (40 mg total) by mouth at bedtime. 30 tablet 11  . OXYGEN Inhale 4.5 L into the lungs continuous.     . pantoprazole (PROTONIX) 40 MG tablet Take 1 tablet (40 mg total) by mouth daily. 30 tablet 3  . potassium chloride SA (K-DUR,KLOR-CON) 20 MEQ tablet Take 20 mEq by mouth 2 (two) times daily.    .Marland Kitchentiotropium (SPIRIVA HANDIHALER) 18 MCG inhalation capsule Place 1 capsule (18 mcg total) into inhaler and inhale daily. 30 capsule 12   No current facility-administered medications for this  encounter.    Allergies  Allergen Reactions  . Fluconazole Anaphylaxis, Itching and Swelling  . Atorvastatin Other (See Comments)    Dizziness after med started and resolved per pt after med stopped. September 08 2014      Social History   Social History  . Marital Status: Divorced    Spouse Name: N/A  . Number of Children: N/A  . Years of Education: N/A   Occupational History  . Not on file.   Social History Main Topics  . Smoking status: Current  Every Day Smoker -- 0.10 packs/day for 45 years    Types: Cigarettes    Start date: 04/04/1969  . Smokeless tobacco: Never Used     Comment: 1 cig per day  . Alcohol Use: No  . Drug Use: No  . Sexual Activity: Not Currently    Birth Control/ Protection: None   Other Topics Concern  . Not on file   Social History Narrative   Formerly worked as a Scientist, water quality, now disabled.   Divorced.   2 grown children.   Lives with her grandson.      Family History  Problem Relation Age of Onset  . Heart disease Mother 59    Deceased from MI at 70yo  . Hypertension Mother   . Heart disease Father 44    Deceased of MI age 84yo  . Hypertension Father   . Hypertension Brother   . Lung cancer      Grandmother    Filed Vitals:   05/13/15 1003  BP: 106/62  Pulse: 98  Weight: 245 lb (111.131 kg)  SpO2: 89%   Wt Readings from Last 3 Encounters:  05/13/15 245 lb (111.131 kg)  05/06/15 235 lb 8 oz (106.822 kg)  05/04/15 253 lb 11.2 oz (115.078 kg)     PHYSICAL EXAM: General:  Chronically ill appearing.  HEENT: normal Neck: supple. JVD difficult, appears 7-8, Carotids 2+ bilat; no bruits. No thyromegaly or nodule noted. Cor: PMI nondisplaced. RRR No rubs, gallops or murmurs. Lungs: Coarse throughout. Decreased BS  Abdomen: Obese, soft, NT, ND, no HSM. No bruits or masses. +BS  Extremities: no cyanosis, clubbing, rash. UNNA boots in place. 2+ edema with trace - 1+ thigh edema Neuro: alert & oriented x 3, cranial nerves grossly intact.  moves all 4 extremities w/o difficulty. Affect pleasant.  ECG: NSR 87 with PACs, Incomplete RBBB  ASSESSMENT & PLAN:  1. Chronic diastolic HF - ECHO 0/2409 EF 50-55%, Grade 1 DD - Increase lasix 80 mg in the am and 40 mg pm and K 20 meq BID.  - Will add metolazone 2.5 mg as needed for weight gains of 5 lbs. Take one tomorrow.  - Needs an Echo.  - Will establish with paramedicine. - Re-educated on limiting salt intake, keeping fluid below 2 L a day, and taking daily weights. - Encouraged to call HF clinic with weight gain of 3 lbs overnight or 5 lbs in one week.  - Reinforced use of sliding scale diuretics, taking an extra lasix for weight gains as above.  2. CAD s/p PCI 2010 - No active symptoms.  - Continue ASA 81 mg and Lovastatin 40 mg daily.    2. COPD - on home 02 4L via [place] 3. Lung cancer, left - Stage IIA (T2b, N0, M0) non-small cell lung cancer, adenocarcinoma with negative EGFR mutation and negative ALK gene translocation, diagnosed in August 2016 and presented with left lower lobe lung mass.  - Needs to follow up with Dr Mora Appl. States mass has been gone on recent x rays.  - Underwent radio-curative therapy. Poor resection candidate with lung disease.  4. Obesity - encouraged to watch portions and increase activity as able.  5. Diabetes - Hgb A1C 7.7 05/04/15,states she has not been told she has diabetes.  - Needs to follow up with IM 6. OSA/?OHS - Snores very loudly - She says she is claustrophobic and is very unlikely to wear a CPAP mask, wishes to hold off at this time.   Will  follow up 1 week.  Legrand Como [address]" [place], PA-C 05/13/2015 11:05 AM   Patient seen and examined with Oda Kilts, PA-C. We discussed all aspects of the encounter. I agree with the assessment and plan as stated above.   Long talk with patient about reasons for recurrent volume overload and hospitalizations. Discussed need for fluid restriction and sliding scale diuretics. Will increase lasix as  above. May need to switch to torsemide. Can use metolazone for weight gain > 5 pounds. Will switch from 2L bottle of Mountain Dew to 1L bottle of [place]. Will see back in 1-2 weeks to reassess her progress. Will arrange paramedicine. Refuses sleep study. Needs to stop smoking. CAD stable without ischemia.   Bob Eastwood,MD 4:50 PM

## 2015-05-13 ENCOUNTER — Other Ambulatory Visit (HOSPITAL_COMMUNITY): Payer: Self-pay | Admitting: Student

## 2015-05-13 ENCOUNTER — Encounter (HOSPITAL_COMMUNITY): Payer: Self-pay

## 2015-05-13 ENCOUNTER — Telehealth (HOSPITAL_COMMUNITY): Payer: Self-pay | Admitting: Cardiology

## 2015-05-13 ENCOUNTER — Ambulatory Visit (HOSPITAL_COMMUNITY)
Admit: 2015-05-13 | Discharge: 2015-05-13 | Disposition: A | Discharge status: Home / Self Care | Payer: Commercial Managed Care - HMO | Source: Ambulatory Visit | Attending: Internal Medicine | Admitting: Internal Medicine

## 2015-05-13 VITALS — BP 106/62 | HR 98 | Wt 245.0 lb

## 2015-05-13 DIAGNOSIS — Z79899 Other long term (current) drug therapy: Secondary | ICD-10-CM | POA: Diagnosis not present

## 2015-05-13 DIAGNOSIS — Z888 Allergy status to other drugs, medicaments and biological substances status: Secondary | ICD-10-CM | POA: Diagnosis not present

## 2015-05-13 DIAGNOSIS — I272 Other secondary pulmonary hypertension: Secondary | ICD-10-CM | POA: Insufficient documentation

## 2015-05-13 DIAGNOSIS — I509 Heart failure, unspecified: Secondary | ICD-10-CM | POA: Diagnosis not present

## 2015-05-13 DIAGNOSIS — Z955 Presence of coronary angioplasty implant and graft: Secondary | ICD-10-CM | POA: Diagnosis not present

## 2015-05-13 DIAGNOSIS — Z8249 Family history of ischemic heart disease and other diseases of the circulatory system: Secondary | ICD-10-CM | POA: Diagnosis not present

## 2015-05-13 DIAGNOSIS — Z9981 Dependence on supplemental oxygen: Secondary | ICD-10-CM | POA: Insufficient documentation

## 2015-05-13 DIAGNOSIS — C3432 Malignant neoplasm of lower lobe, left bronchus or lung: Secondary | ICD-10-CM | POA: Insufficient documentation

## 2015-05-13 DIAGNOSIS — E669 Obesity, unspecified: Secondary | ICD-10-CM | POA: Insufficient documentation

## 2015-05-13 DIAGNOSIS — Z7982 Long term (current) use of aspirin: Secondary | ICD-10-CM | POA: Diagnosis not present

## 2015-05-13 DIAGNOSIS — Z720 Tobacco use: Secondary | ICD-10-CM | POA: Diagnosis not present

## 2015-05-13 DIAGNOSIS — I2510 Atherosclerotic heart disease of native coronary artery without angina pectoris: Secondary | ICD-10-CM

## 2015-05-13 DIAGNOSIS — K219 Gastro-esophageal reflux disease without esophagitis: Secondary | ICD-10-CM | POA: Insufficient documentation

## 2015-05-13 DIAGNOSIS — F17210 Nicotine dependence, cigarettes, uncomplicated: Secondary | ICD-10-CM | POA: Insufficient documentation

## 2015-05-13 DIAGNOSIS — I5032 Chronic diastolic (congestive) heart failure: Secondary | ICD-10-CM | POA: Diagnosis not present

## 2015-05-13 DIAGNOSIS — E785 Hyperlipidemia, unspecified: Secondary | ICD-10-CM | POA: Diagnosis not present

## 2015-05-13 DIAGNOSIS — R6889 Other general symptoms and signs: Secondary | ICD-10-CM | POA: Diagnosis not present

## 2015-05-13 DIAGNOSIS — Z923 Personal history of irradiation: Secondary | ICD-10-CM | POA: Insufficient documentation

## 2015-05-13 DIAGNOSIS — E119 Type 2 diabetes mellitus without complications: Secondary | ICD-10-CM | POA: Insufficient documentation

## 2015-05-13 DIAGNOSIS — J449 Chronic obstructive pulmonary disease, unspecified: Secondary | ICD-10-CM | POA: Insufficient documentation

## 2015-05-13 DIAGNOSIS — G4733 Obstructive sleep apnea (adult) (pediatric): Secondary | ICD-10-CM | POA: Insufficient documentation

## 2015-05-13 LAB — BASIC METABOLIC PANEL
ANION GAP: 9 (ref 5–15)
BUN: 5 mg/dL — ABNORMAL LOW (ref 6–20)
CO2: 38 mmol/L — ABNORMAL HIGH (ref 22–32)
Calcium: 9.1 mg/dL (ref 8.9–10.3)
Chloride: 89 mmol/L — ABNORMAL LOW (ref 101–111)
Creatinine, Ser: 0.89 mg/dL (ref 0.44–1.00)
GFR calc Af Amer: >60 mL/min (ref >60)
GLUCOSE: 288 mg/dL — AB (ref 65–99)
POTASSIUM: 3.7 mmol/L (ref 3.5–5.1)
SODIUM: 136 mmol/L (ref 135–145)

## 2015-05-13 LAB — BRAIN NATRIURETIC PEPTIDE: B NATRIURETIC PEPTIDE 5: 59.2 pg/mL (ref 0.0–100.0)

## 2015-05-13 MED ORDER — POTASSIUM CHLORIDE CRYS ER 20 MEQ PO TBCR: 20.0000 meq | EXTENDED_RELEASE_TABLET | Freq: Two times a day (BID) | ORAL | Status: DC

## 2015-05-13 MED ORDER — METOLAZONE 2.5 MG PO TABS: 2.5000 mg | ORAL_TABLET | ORAL | Status: DC | PRN

## 2015-05-13 MED ORDER — POTASSIUM CHLORIDE CRYS ER 20 MEQ PO TBCR: 20.0000 meq | EXTENDED_RELEASE_TABLET | ORAL | Status: DC

## 2015-05-13 MED ORDER — FUROSEMIDE 40 MG PO TABS: 40.0000 mg | ORAL_TABLET | ORAL | Status: DC

## 2015-05-13 NOTE — Patient Instructions (Signed)
INCREASE Lasix to 80 mg (2 tabs) in the AM and 40 mg (1 tab) in the PM  ADD Metolazone 2.5 mg as needed for 5 lbs weight gain BE SURE TO TAKE ONE TODAY 05/13/2015  When you take a METOLAZONE be sure to take additional 20 meQ of Potassium  Labs today Your physician recommends that you schedule a follow-up appointment with Oncology Your physician recommends that you schedule a follow-up appointment with Internal Medicine  Your physician recommends that you schedule a follow-up appointment in: 1 week with Dr.Bensimhon  Do the following things EVERYDAY: 1) Weigh yourself in the morning before breakfast. Write it down and keep it in a log. 2) Take your medicines as prescribed 3) Eat low salt foods-Limit salt (sodium) to 2000 mg per day.  4) Stay as active as you can everyday 5) Limit all fluids for the day to less than 2 liters 6)

## 2015-05-13 NOTE — Telephone Encounter (Signed)
-----  Message from [name] Friar, PA-C sent at 05/13/2015 12:49 PM EST ----- Increase potassium to 40 meq q am and 20 meq q pm.    Should also still take extra 20 when she needs metolazone.   Legrand Como [address]" [place], [place] 05/13/2015 12:49 PM

## 2015-05-13 NOTE — Progress Notes (Signed)
Advanced Heart Failure Medication Review by a Pharmacist  Does the patient  feel that his/her medications are working for him/her?  yes  Has the patient been experiencing any side effects to the medications prescribed?  no  Does the patient measure his/her own blood pressure or blood glucose at home?  no   Does the patient have any problems obtaining medications due to transportation or finances?   no  Understanding of regimen: good Understanding of indications: good Potential of compliance: good Patient understands to avoid NSAIDs. Patient understands to avoid decongestants.  Issues to address at subsequent visits: None   Pharmacist comments:  Ms. Pae is a pleasant 60 yo F presenting with her son and a current medication list. She reports good compliance with her regimen but does admit to taking lasix 80 mg BID since she was discharged last Wednesday but was notified on Friday to only take 40 mg BID and has adhered to this regimen since. We did discuss the risks of NSAID use as well as BC powder and she stated that she has greatly reduced her use of BC powder recently.   Ruta Hinds. Velva Harman, PharmD, BCPS, CPP Clinical Pharmacist Pager: [phone] Phone: [phone] 05/13/2015 10:20 AM      Time with patient: 10 minutes Preparation and documentation time: 2 minutes Total time: 12 minutes

## 2015-05-13 NOTE — Telephone Encounter (Signed)
Pt aware and voiced understanding 

## 2015-05-14 ENCOUNTER — Telehealth: Payer: Self-pay | Admitting: Internal Medicine

## 2015-05-14 ENCOUNTER — Telehealth: Payer: Self-pay | Admitting: *Deleted

## 2015-05-14 ENCOUNTER — Other Ambulatory Visit: Payer: Self-pay | Admitting: *Deleted

## 2015-05-14 DIAGNOSIS — E119 Type 2 diabetes mellitus without complications: Secondary | ICD-10-CM | POA: Diagnosis not present

## 2015-05-14 DIAGNOSIS — I5031 Acute diastolic (congestive) heart failure: Secondary | ICD-10-CM | POA: Diagnosis not present

## 2015-05-14 DIAGNOSIS — I2510 Atherosclerotic heart disease of native coronary artery without angina pectoris: Secondary | ICD-10-CM | POA: Diagnosis not present

## 2015-05-14 DIAGNOSIS — I8311 Varicose veins of right lower extremity with inflammation: Secondary | ICD-10-CM | POA: Diagnosis not present

## 2015-05-14 DIAGNOSIS — M150 Primary generalized (osteo)arthritis: Secondary | ICD-10-CM | POA: Diagnosis not present

## 2015-05-14 DIAGNOSIS — K219 Gastro-esophageal reflux disease without esophagitis: Secondary | ICD-10-CM | POA: Diagnosis not present

## 2015-05-14 DIAGNOSIS — I8312 Varicose veins of left lower extremity with inflammation: Secondary | ICD-10-CM | POA: Diagnosis not present

## 2015-05-14 DIAGNOSIS — J449 Chronic obstructive pulmonary disease, unspecified: Secondary | ICD-10-CM | POA: Diagnosis not present

## 2015-05-14 DIAGNOSIS — F329 Major depressive disorder, single episode, unspecified: Secondary | ICD-10-CM | POA: Diagnosis not present

## 2015-05-14 NOTE — Patient Outreach (Signed)
[facility]) Care Management   05/14/2015  SIMONE RODENBECK 1956/02/21 [id number]  KATALEYAH CARDUCCI is an 60 y.o. female  Subjective:  Pt reports f/u at Heart Failure clinic yesterday, informed still holding fluid.   Pt reports  Metolazone rx was called in, was suppose to take one yesterday and if have a weight gain of 5 lbs.  Pt reports to  have someone pick up the medication today.  Pt reports her weight today is 242 lbs. Up   from 237 lbs at discharge.  Pt reports [place] RN coming once a week to change Unna boot dressings, redness gone.   Pt reports no new update on moving, need to have someone move out of ground floor apartment.    Objective:   Filed Vitals:   05/14/15 1017  BP: 102/70  Pulse: 85  Resp: 20    ROS  Physical Exam  Constitutional: She is oriented to person, place, and time. She appears well-developed and well-nourished.  Cardiovascular: Normal rate and regular rhythm.   Respiratory: Effort normal.  Congestion noted on inspiration in RLL posteriorly   GI: Soft. Bowel sounds are normal.  Musculoskeletal: Normal range of motion. She exhibits edema.  Edema top of feet.   Neurological: She is alert and oriented to person, place, and time.  3+ edema in right lower extremity above Una boot.  +2 in LLE.    Skin: Skin is warm and dry.  Psychiatric: She has a normal mood and affect. Her behavior is normal. Judgment and thought content normal.    Current Medications:  Reviewed with pt.  Current Outpatient Prescriptions  Medication Sig Dispense Refill  . albuterol (PROVENTIL) (2.5 MG/3ML) 0.083% nebulizer solution Take 3 mLs (2.5 mg total) by nebulization every 6 (six) hours as needed for shortness of breath. 360 mL 3  . albuterol (VENTOLIN HFA) 108 (90 BASE) MCG/ACT inhaler Inhale 2 puffs into the lungs every 6 (six) hours as needed for shortness of breath. 18 g 11  . aspirin 81 MG chewable tablet Chew 1 tablet (81 mg total) by mouth at bedtime. 90 tablet 3  .  Aspirin-Salicylamide-Caffeine (BC HEADACHE POWDER PO) Take 1 packet by mouth 3 (three) times daily as needed (pain).    . Fluticasone-Salmeterol (ADVAIR DISKUS) 250-50 MCG/DOSE AEPB Inhale 1 puff into the lungs 2 (two) times daily. 60 each 5  . furosemide (LASIX) 40 MG tablet Take 1-2 tablets (40-80 mg total) by mouth as directed. Take 80 mg (2 tabs) in the AM and 40 mg (1 tab) in the PM 90 tablet 3  . gabapentin (NEURONTIN) 300 MG capsule Take 300 mg by mouth at bedtime.    . lovastatin (MEVACOR) 40 MG tablet Take 1 tablet (40 mg total) by mouth at bedtime. 30 tablet 11  . OXYGEN Inhale 4.5 L into the lungs continuous.     . pantoprazole (PROTONIX) 40 MG tablet Take 1 tablet (40 mg total) by mouth daily. 30 tablet 3  . potassium chloride SA (K-DUR,KLOR-CON) 20 MEQ tablet Take 1-2 tablets (20-40 mEq total) by mouth as directed. Take 40 meq in the am and 20 meq in the PM May take add'l 20 meq prn with Metolazone 105 tablet 3  . tiotropium (SPIRIVA HANDIHALER) 18 MCG inhalation capsule Place 1 capsule (18 mcg total) into inhaler and inhale daily. 30 capsule 12  . metolazone (ZAROXOLYN) 2.5 MG tablet TAKE 1 TABLET BY MOUTH AS NEEDED(FOR 5 LBS WEIGHT GAIN) (Patient not taking: Reported on 05/14/2015)  90 tablet 3   No current facility-administered medications for this visit.    Functional Status:   In your present state of health, do you have any difficulty performing the following activities: 05/05/2015 05/04/2015  Hearing? N N  Vision? N -  Difficulty concentrating or making decisions? N N  Walking or climbing stairs? N Y  Dressing or bathing? N N  Doing errands, shopping? N Y    Fall/Depression Screening:    PHQ 2/9 Scores 05/04/2015 04/16/2015 03/25/2015 01/16/2015 10/08/2014 10/02/2014 09/11/2014  PHQ - 2 Score _0 PHQ- 9 Score - - _1 - 10    Assessment:  HF=edema noted in bilateral lower extremities above Unna boots- +3 in right, +2 in left.   Reported weight                               Today 242 lbs.  Pt needs to pick up Metolazone rx, take as ordered.                          Skin integrity- Unna boots intact.    Plan:  Pt to have friend or daughter pick up Metolazone rx, take today and thereafter with weight gain of 5 lbs.             Pt to continue to weigh daily, record, call MD if weight continues to elevate.             Pt to f/u at Heart Failure clinic 2/16.              Plan to follow up with pt telephonically 2/15 as part of ongoing transition of care.   [facility] CM Care Plan Problem One        Most Recent Value   Care Plan Problem One  Risk for readmission- > 6 admits in 6 months.    Role Documenting the Problem One  Care Management Coordinator   Care Plan for Problem One  Active   [facility] Long Term Goal (31-90 days)  Pt would not readmit 31 days from day of discharge    [place] Term Goal Start Date  05/07/15   Interventions for Problem One Long Term Goal  Discussed with pt importance of medication compliance, f/u with MD    [facility] CM Short Term Goal #1 (0-30 days)  Skin integrity in pt's bilateral lower extremities would improve in the next 30 days    [facility] CM Short Term Goal #1 Start Date  05/07/15   Interventions for Short Term Goal #1  Discussed with pt compliance ongoing compliance with diurectic, keeping [place] on [facility] RN to change).    [facility] CM Short Term Goal #2 (0-30 days)  Pt would weigh daily for the next 14 days    [facility] CM Short Term Goal #2 Start Date  05/07/15   Interventions for Short Term Goal #2  Reinforced with pt to pick up Metolazone, take as ordered by MD      Zara Chess.   [place] Care Management  [phone]

## 2015-05-14 NOTE — Telephone Encounter (Signed)
per pof to sch pt appt-cld & spoke to pt and gave pt tiem & date of appt

## 2015-05-14 NOTE — Telephone Encounter (Signed)
FYI "I need an appointment for lab and to see Dr. Julien Nordmann.  I had scans 04-24-2015.  Previously scheduled appointments were cancelled due to hospitalizations."  P.O.F. Generated.  Return number [phone].

## 2015-05-18 DIAGNOSIS — I8312 Varicose veins of left lower extremity with inflammation: Secondary | ICD-10-CM | POA: Diagnosis not present

## 2015-05-18 DIAGNOSIS — E119 Type 2 diabetes mellitus without complications: Secondary | ICD-10-CM | POA: Diagnosis not present

## 2015-05-18 DIAGNOSIS — I5031 Acute diastolic (congestive) heart failure: Secondary | ICD-10-CM | POA: Diagnosis not present

## 2015-05-18 DIAGNOSIS — K219 Gastro-esophageal reflux disease without esophagitis: Secondary | ICD-10-CM | POA: Diagnosis not present

## 2015-05-18 DIAGNOSIS — I8311 Varicose veins of right lower extremity with inflammation: Secondary | ICD-10-CM | POA: Diagnosis not present

## 2015-05-18 DIAGNOSIS — M150 Primary generalized (osteo)arthritis: Secondary | ICD-10-CM | POA: Diagnosis not present

## 2015-05-18 DIAGNOSIS — I2510 Atherosclerotic heart disease of native coronary artery without angina pectoris: Secondary | ICD-10-CM | POA: Diagnosis not present

## 2015-05-18 DIAGNOSIS — J449 Chronic obstructive pulmonary disease, unspecified: Secondary | ICD-10-CM | POA: Diagnosis not present

## 2015-05-18 DIAGNOSIS — F329 Major depressive disorder, single episode, unspecified: Secondary | ICD-10-CM | POA: Diagnosis not present

## 2015-05-20 ENCOUNTER — Other Ambulatory Visit: Payer: Self-pay | Admitting: *Deleted

## 2015-05-20 NOTE — Patient Outreach (Signed)
Transition of care call (week 3, discharged 2/1):  Pt reports picked up the  Metolazone rx on  2/11, took one time, weight down to 239 lbs today.   Pt reports she took the Metolazone by itself, reread the papers and realized she was to take it in addition to her Lasix.  Pt reports she started back on Lasix 2/12 (80 mg in am, 40 mg in pm, swelling in legs is coming down, no c/o pain.  Pt reports weighing every day,watching her salt.   Pt reports HH RN is coming twice a week to change bandages (Una boot) on legs.  Pt reports she was suppose to f/u with MD (Primary Care) 2/16 but had to cancel, did not arrange her transportation in time.  Pt reports she left a voice message, to call again today to reschedule appointment.   As discussed, plan to f/u again telephonically 2/22 as part of ongoing transition of care.   Zara Chess.   [place] Care Management  [phone]

## 2015-05-21 ENCOUNTER — Encounter (HOSPITAL_COMMUNITY): Payer: Commercial Managed Care - HMO | Admitting: Internal Medicine

## 2015-05-21 ENCOUNTER — Other Ambulatory Visit: Payer: Self-pay

## 2015-05-21 DIAGNOSIS — K219 Gastro-esophageal reflux disease without esophagitis: Secondary | ICD-10-CM | POA: Diagnosis not present

## 2015-05-21 DIAGNOSIS — F329 Major depressive disorder, single episode, unspecified: Secondary | ICD-10-CM | POA: Diagnosis not present

## 2015-05-21 DIAGNOSIS — M150 Primary generalized (osteo)arthritis: Secondary | ICD-10-CM | POA: Diagnosis not present

## 2015-05-21 DIAGNOSIS — I8311 Varicose veins of right lower extremity with inflammation: Secondary | ICD-10-CM | POA: Diagnosis not present

## 2015-05-21 DIAGNOSIS — I2510 Atherosclerotic heart disease of native coronary artery without angina pectoris: Secondary | ICD-10-CM | POA: Diagnosis not present

## 2015-05-21 DIAGNOSIS — I5031 Acute diastolic (congestive) heart failure: Secondary | ICD-10-CM | POA: Diagnosis not present

## 2015-05-21 DIAGNOSIS — E119 Type 2 diabetes mellitus without complications: Secondary | ICD-10-CM | POA: Diagnosis not present

## 2015-05-21 DIAGNOSIS — I8312 Varicose veins of left lower extremity with inflammation: Secondary | ICD-10-CM | POA: Diagnosis not present

## 2015-05-21 DIAGNOSIS — J449 Chronic obstructive pulmonary disease, unspecified: Secondary | ICD-10-CM | POA: Diagnosis not present

## 2015-05-25 DIAGNOSIS — F329 Major depressive disorder, single episode, unspecified: Secondary | ICD-10-CM | POA: Diagnosis not present

## 2015-05-25 DIAGNOSIS — E119 Type 2 diabetes mellitus without complications: Secondary | ICD-10-CM | POA: Diagnosis not present

## 2015-05-25 DIAGNOSIS — I5031 Acute diastolic (congestive) heart failure: Secondary | ICD-10-CM | POA: Diagnosis not present

## 2015-05-25 DIAGNOSIS — I8311 Varicose veins of right lower extremity with inflammation: Secondary | ICD-10-CM | POA: Diagnosis not present

## 2015-05-25 DIAGNOSIS — J449 Chronic obstructive pulmonary disease, unspecified: Secondary | ICD-10-CM | POA: Diagnosis not present

## 2015-05-25 DIAGNOSIS — M150 Primary generalized (osteo)arthritis: Secondary | ICD-10-CM | POA: Diagnosis not present

## 2015-05-25 DIAGNOSIS — I8312 Varicose veins of left lower extremity with inflammation: Secondary | ICD-10-CM | POA: Diagnosis not present

## 2015-05-25 DIAGNOSIS — K219 Gastro-esophageal reflux disease without esophagitis: Secondary | ICD-10-CM | POA: Diagnosis not present

## 2015-05-25 DIAGNOSIS — I2510 Atherosclerotic heart disease of native coronary artery without angina pectoris: Secondary | ICD-10-CM | POA: Diagnosis not present

## 2015-05-27 ENCOUNTER — Ambulatory Visit (INDEPENDENT_AMBULATORY_CARE_PROVIDER_SITE_OTHER): Payer: Commercial Managed Care - HMO | Admitting: Internal Medicine

## 2015-05-27 ENCOUNTER — Encounter: Payer: Self-pay | Admitting: Internal Medicine

## 2015-05-27 ENCOUNTER — Other Ambulatory Visit: Payer: Self-pay | Admitting: *Deleted

## 2015-05-27 VITALS — BP 109/60 | HR 90 | Temp 98.9°F | Wt 243.3 lb

## 2015-05-27 DIAGNOSIS — I5032 Chronic diastolic (congestive) heart failure: Secondary | ICD-10-CM | POA: Diagnosis not present

## 2015-05-27 DIAGNOSIS — R6889 Other general symptoms and signs: Secondary | ICD-10-CM | POA: Diagnosis not present

## 2015-05-27 DIAGNOSIS — J449 Chronic obstructive pulmonary disease, unspecified: Secondary | ICD-10-CM

## 2015-05-27 MED ORDER — AZITHROMYCIN 250 MG PO TABS: ORAL_TABLET | ORAL | Status: AC

## 2015-05-27 MED ORDER — GUAIFENESIN-CODEINE 100-10 MG/5ML PO SOLN: 5.0000 mL | Freq: Four times a day (QID) | ORAL | Status: DC | PRN

## 2015-05-27 NOTE — Patient Instructions (Signed)
General Instructions:  I want you to continue to monitor your weights, continue the lasix 68m in AM and 435min PM with use of Metolazone as needed to keep weight at 235-237.  I am giving you a cough syrup and an antibiotic to see if this will help your cough.  We will see you back in 1-2 weeks  Please bring your medicines with you each time you come to clinic.  Medicines may include prescription medications, over-the-counter medications, herbal remedies, eye drops, vitamins, or other pills.   Progress Toward Treatment Goals:  Treatment Goal 09/11/2014  Stop smoking -  Prevent falls unchanged    Self Care Goals & Plans:  Self Care Goal 04/16/2015  Manage my medications bring my medications to every visit; refill my medications on time; take my medicines as prescribed  Eat healthy foods -  Be physically active -  Stop smoking set a quit date and stop smoking  Prevent falls have my vision checked; wear appropriate shoes  Meeting treatment goals -    No flowsheet data found.   Care Management & Community Referrals:  Referral 04/24/2013  [facility] care [facility]  Referrals made to community resources transportation

## 2015-05-27 NOTE — Patient Outreach (Signed)
Transition of care call (week 4, discharged 2/1).  Pt reports took another Metolazone 2/21 because [facility] RN who came on  2/20 said she had some fluid in her lungs, weight up to 240 lbs.  Pt reports she took the medication right this time, in addition to her Lasix (56m in am, 40 mg in pm) even though weight was up  3 lbs not 5 lbs..  Pt reports she  feels better, tightness in chest gone, swelling down.  Pt reports HH RN continues  to wrap legs, to f/u with MD today - ask how long HPark ViewRN will be coming.  Pt reports to f/u with cancer MD tomorrow.  RN CM discussed with pt coworker Pam RN CM will be doing final transition of care call next week  And then [place].     RZara Chess   [facility]  [phone]

## 2015-05-27 NOTE — Assessment & Plan Note (Signed)
A: Chronic diastolic CHF  P: Her weight is up compared to her previous weight on discharge (235), however she does not appear grossly volume overloaded and there may be some variation in the scales/ additional clothing ect.  With [facility]'s help she seems to be getting a better idea of how to dose metolazone and I have tried to reinforce this today.

## 2015-05-27 NOTE — Progress Notes (Signed)
[facility] Subjective:   Patient ID: BIANA HAGGAR female   DOB: 1955/09/17 60 y.o.   MRN: [id number]  HPI: Ms.Veneda B Pikus is a 60 y.o. female with a PMH detailed below who presents for hospital follow up.  She was discharged about 3 weeks ago after a CHF exacerbation.  [facility] has been following her since discharge.  She reports that she has been tracking her weight which was 237 on her home scale after discharge.  She has been taking her lasix twice day (80 in AM and 40 in PM) as directed .  She has used a dose of metolazone twice when encouraged by [facility] nurses (last use yesterday).  She reports that the metolazone helps greatly when she uses it and that her weight this morning was back down to 237 lbs on her home scale.  She is chronic on 4L of home O2 and currently reports she feels comfortable at this dose.   She notes her priority today is her chronic cough.  She reports the cough has been ongoing for weeks to months and "no one is doing anything about it."  She reports usually her cough is more of a clear color but recently it has changed to more greenish.  She has tried mucinex that helps some.  Delsym and tessalon pearls were less helpful.  She deneis any fever, but does feel cold sometimes.  She also dropped off a form today requesting to allow a dog to be home with her, she reports that walking the dog is the only exercise she gets and it a great part of her overall well being.     Past Medical History  Diagnosis Date  . Coronary artery disease     S/P PCI of LAD with DES (12/2008). Total occlusion of RCA noted at that time., medically managed. ACS ruled out 03/2009 with Lexiscan myoview . Followed by [facility].  . Pulmonary hypertension ([place])     2-D Echo (56/4332) - Systolic pressure was moderately increased. PA peak pressure  26mHg. secondary pulm htn likely on basis of comb of interstital lung disease, severe copd, small airways disease, severe sleep apnea and cor  pulmonale,. Followed by Dr. WJoya Gaskins([facility]  . Diastolic dysfunction     2-D Echo (12/2008) - Normal LV Systolic funciton with EF 60-65%. Grade 1 diastolid dysfunction. No regional wall motion abnormalities. Moderate pulmonary HTN with PA peak pressure 545mg.  . Marland KitchenOPD (chronic obstructive pulmonary disease) (HCC)     Severe. Gold Stage IV.  PFTs (12/2008) - severe obstructive airway disease. Active tobacco use. Requires 4L O2 at home.  . Pulmonary nodule, right     Small right middle lobe nodule. Stable as of 12/2008.  . Marland Kitchenrediabetes     HgbA1c 6.4 (12/2008)  . Hx MRSA infection     Recurrent MRSA thigh abscesses.  . Obesity   . Hyperlipidemia   . GERD (gastroesophageal reflux disease)     S/P Nissen fundoplication.  . [place]estive heart failure) ([place]  . On home oxygen therapy since 2010    4L all the time  . Shortness of breath dyspnea     with a lot of exertion; if fluid builds up  . Pneumonia   . Depression   . Full dentures   . [place]    "get it most years" (03/03/2015)  . [place] cancer ([place]    "left"  . History of hiatal hernia   .  Arthritis     "some scattered" (03/03/2015)   Current Outpatient Prescriptions  Medication Sig Dispense Refill  . albuterol (PROVENTIL) (2.5 MG/3ML) 0.083% nebulizer solution Take 3 mLs (2.5 mg total) by nebulization every 6 (six) hours as needed for shortness of breath. 360 mL 3  . albuterol (VENTOLIN HFA) 108 (90 BASE) MCG/ACT inhaler Inhale 2 puffs into the lungs every 6 (six) hours as needed for shortness of breath. 18 g 11  . aspirin 81 MG chewable tablet Chew 1 tablet (81 mg total) by mouth at bedtime. 90 tablet 3  . Aspirin-Salicylamide-Caffeine (BC HEADACHE POWDER PO) Take 1 packet by mouth 3 (three) times daily as needed (pain).    Marland Kitchen azithromycin (ZITHROMAX Z-PAK) 250 MG tablet Take 2 tablets (500 mg) on  Day 1,  followed by 1 tablet (250 mg) once daily on Days 2 through 5. 6 each 0  . Fluticasone-Salmeterol  (ADVAIR DISKUS) 250-50 MCG/DOSE AEPB Inhale 1 puff into the lungs 2 (two) times daily. 60 each 5  . furosemide (LASIX) 40 MG tablet Take 1-2 tablets (40-80 mg total) by mouth as directed. Take 80 mg (2 tabs) in the AM and 40 mg (1 tab) in the PM 90 tablet 3  . gabapentin (NEURONTIN) 300 MG capsule Take 300 mg by mouth at bedtime.    Marland Kitchen guaiFENesin-codeine 100-10 MG/5ML syrup Take 5 mLs by mouth every 6 (six) hours as needed for cough. 120 mL 0  . lovastatin (MEVACOR) 40 MG tablet Take 1 tablet (40 mg total) by mouth at bedtime. 30 tablet 11  . metolazone (ZAROXOLYN) 2.5 MG tablet TAKE 1 TABLET BY MOUTH AS NEEDED(FOR 5 LBS WEIGHT GAIN) (Patient not taking: Reported on 05/14/2015) 90 tablet 3  . OXYGEN Inhale 4.5 L into the lungs continuous.     . pantoprazole (PROTONIX) 40 MG tablet Take 1 tablet (40 mg total) by mouth daily. 30 tablet 3  . potassium chloride SA (K-DUR,KLOR-CON) 20 MEQ tablet Take 1-2 tablets (20-40 mEq total) by mouth as directed. Take 40 meq in the am and 20 meq in the PM May take add'l 20 meq prn with Metolazone 105 tablet 3  . tiotropium (SPIRIVA HANDIHALER) 18 MCG inhalation capsule Place 1 capsule (18 mcg total) into inhaler and inhale daily. 30 capsule 12   No current facility-administered medications for this visit.   Family History  Problem Relation Age of Onset  . Heart disease Mother 39    Deceased from MI at 67yo  . Hypertension Mother   . Heart disease Father 38    Deceased of MI age 21yo  . Hypertension Father   . Hypertension Brother   . Lung cancer      Grandmother   Social History   Social History  . Marital Status: Divorced    Spouse Name: N/A  . Number of Children: N/A  . Years of Education: N/A   Social History Main Topics  . Smoking status: Current Every Day Smoker -- 0.50 packs/day for 45 years    Types: Cigarettes    Start date: 04/04/1969  . Smokeless tobacco: Never Used     Comment: 1 cig per day  . Alcohol Use: No  . Drug Use: No  .  Sexual Activity: Not Currently    Birth Control/ Protection: None   Other Topics Concern  . None   Social History Narrative   Formerly worked as a Scientist, water quality, now disabled.   Divorced.   2 grown children.   Lives with her  grandson.   Review of Systems: Review of Systems  Constitutional: Negative for fever, chills and weight loss.  Respiratory: Positive for cough, sputum production and shortness of breath (chronic). Negative for wheezing.   Cardiovascular: Negative for chest pain and leg swelling ("cant tell with these Unna whatever on").  Genitourinary: Negative for dysuria.     Objective:  Physical Exam: Filed Vitals:   05/27/15 1511  BP: 109/60  Pulse: 90  Temp: 98.9 F (37.2 C)  TempSrc: Oral  Weight: 243 lb 4.8 oz (110.36 kg)  SpO2: 93%  Physical Exam  Constitutional: No distress.  Coughing frequently  Cardiovascular: Normal rate and normal heart sounds.   Pulmonary/Chest: Effort normal.  Mild crackles at left base  Abdominal: Soft. Bowel sounds are normal.  Musculoskeletal:  Bilateral unna boots in place  Nursing note and vitals reviewed.   Assessment & Plan:  Case discussed with Dr. Dareen Piano  Severe chronic obstructive pulmonary disease ([place]) A: Severe COPD  P: Her overall respiratory status appears to be rogughly at baseline, however with her increased sputum production and change in consistency I am concerned for a possible mild developing COPD exacerbation, will treat with Zpak and Guaifenesin codeine and follow up in 1-2 weeks.  Will try to avoid steroids as much as possible with her newly diagnosed steroid inducted diabetes.  Diastolic CHF (HCC) A: Chronic diastolic CHF  P: Her weight is up compared to her previous weight on discharge (235), however she does not appear grossly volume overloaded and there may be some variation in the scales/ additional clothing ect.  With [facility]'s help she seems to be getting a better idea of how to dose metolazone and I have  tried to reinforce this today.    Medications Ordered Meds ordered this encounter  Medications  . guaiFENesin-codeine 100-10 MG/5ML syrup    Sig: Take 5 mLs by mouth every 6 (six) hours as needed for cough.    Dispense:  120 mL    Refill:  0  . azithromycin (ZITHROMAX Z-PAK) 250 MG tablet    Sig: Take 2 tablets (500 mg) on  Day 1,  followed by 1 tablet (250 mg) once daily on Days 2 through 5.    Dispense:  6 each    Refill:  0   Other Orders No orders of the defined types were placed in this encounter.   Follow Up: Return 1-2 weeks.

## 2015-05-27 NOTE — Assessment & Plan Note (Signed)
A: Severe COPD  P: Her overall respiratory status appears to be rogughly at baseline, however with her increased sputum production and change in consistency I am concerned for a possible mild developing COPD exacerbation, will treat with Zpak and Guaifenesin codeine and follow up in 1-2 weeks.  Will try to avoid steroids as much as possible with her newly diagnosed steroid inducted diabetes.

## 2015-05-28 ENCOUNTER — Other Ambulatory Visit (HOSPITAL_BASED_OUTPATIENT_CLINIC_OR_DEPARTMENT_OTHER): Payer: Commercial Managed Care - HMO

## 2015-05-28 ENCOUNTER — Ambulatory Visit (HOSPITAL_BASED_OUTPATIENT_CLINIC_OR_DEPARTMENT_OTHER): Payer: Commercial Managed Care - HMO | Admitting: Internal Medicine

## 2015-05-28 ENCOUNTER — Encounter: Payer: Self-pay | Admitting: Internal Medicine

## 2015-05-28 ENCOUNTER — Telehealth (HOSPITAL_COMMUNITY): Payer: Self-pay

## 2015-05-28 VITALS — BP 92/35 | HR 95 | Temp 97.8°F | Resp 16 | Ht 65.0 in | Wt 239.4 lb

## 2015-05-28 DIAGNOSIS — R0602 Shortness of breath: Secondary | ICD-10-CM

## 2015-05-28 DIAGNOSIS — C3432 Malignant neoplasm of lower lobe, left bronchus or lung: Secondary | ICD-10-CM | POA: Diagnosis not present

## 2015-05-28 DIAGNOSIS — C3492 Malignant neoplasm of unspecified part of left bronchus or lung: Secondary | ICD-10-CM

## 2015-05-28 DIAGNOSIS — R609 Edema, unspecified: Secondary | ICD-10-CM

## 2015-05-28 LAB — CBC WITH DIFFERENTIAL/PLATELET
BASO%: 0.7 % (ref 0.0–2.0)
Basophils Absolute: 0.1 10*3/uL (ref 0.0–0.1)
EOS ABS: 0.2 10*3/uL (ref 0.0–0.5)
EOS%: 1.4 % (ref 0.0–7.0)
HCT: 42.7 % (ref 34.8–46.6)
HEMOGLOBIN: 14.1 g/dL (ref 11.6–15.9)
LYMPH%: 4.8 % — AB (ref 14.0–49.7)
MCH: 29.9 pg (ref 25.1–34.0)
MCHC: 32.9 g/dL (ref 31.5–36.0)
MCV: 91 fL (ref 79.5–101.0)
MONO#: 0.9 10*3/uL (ref 0.1–0.9)
MONO%: 7.8 % (ref 0.0–14.0)
NEUT%: 85.3 % — ABNORMAL HIGH (ref 38.4–76.8)
NEUTROS ABS: 10 10*3/uL — AB (ref 1.5–6.5)
Platelets: 246 10*3/uL (ref 145–400)
RBC: 4.7 10*6/uL (ref 3.70–5.45)
RDW: 15 % — AB (ref 11.2–14.5)
WBC: 11.7 10*3/uL — AB (ref 3.9–10.3)
lymph#: 0.6 10*3/uL — ABNORMAL LOW (ref 0.9–3.3)

## 2015-05-28 LAB — COMPREHENSIVE METABOLIC PANEL
ALBUMIN: 3.1 g/dL — AB (ref 3.5–5.0)
ALK PHOS: 211 U/L — AB (ref 40–150)
ALT: 10 U/L (ref 0–55)
ANION GAP: 17 meq/L — AB (ref 3–11)
AST: 12 U/L (ref 5–34)
BILIRUBIN TOTAL: 0.61 mg/dL (ref 0.20–1.20)
BUN: 9.2 mg/dL (ref 7.0–26.0)
CO2: 37 mEq/L — ABNORMAL HIGH (ref 22–29)
Calcium: 9.8 mg/dL (ref 8.4–10.4)
Chloride: 82 mEq/L — ABNORMAL LOW (ref 98–109)
Creatinine: 1 mg/dL (ref 0.6–1.1)
EGFR: 61 mL/min/{1.73_m2} — AB (ref >90)
Glucose: 205 mg/dl — ABNORMAL HIGH (ref 70–140)
POTASSIUM: 3.4 meq/L — AB (ref 3.5–5.1)
SODIUM: 136 meq/L (ref 136–145)
TOTAL PROTEIN: 7.4 g/dL (ref 6.4–8.3)

## 2015-05-28 NOTE — Patient Instructions (Signed)
Smoking Cessation, Tips for Success If you are ready to quit smoking, congratulations! You have chosen to help yourself be healthier. Cigarettes bring nicotine, tar, carbon monoxide, and other irritants into your body. Your lungs, heart, and blood vessels will be able to work better without these poisons. There are many different ways to quit smoking. Nicotine gum, nicotine patches, a nicotine inhaler, or nicotine nasal spray can help with physical craving. Hypnosis, support groups, and medicines help break the habit of smoking. WHAT THINGS CAN I DO TO MAKE QUITTING EASIER?  Here are some tips to help you quit for good:  Pick a date when you will quit smoking completely. Tell all of your friends and family about your plan to quit on that date.  Do not try to slowly cut down on the number of cigarettes you are smoking. Pick a quit date and quit smoking completely starting on that day.  Throw away all cigarettes.   Clean and remove all ashtrays from your home, work, and car.  On a card, write down your reasons for quitting. Carry the card with you and read it when you get the urge to smoke.  Cleanse your body of nicotine. Drink enough water and fluids to keep your urine clear or pale yellow. Do this after quitting to flush the nicotine from your body.  Learn to predict your moods. Do not let a bad situation be your excuse to have a cigarette. Some situations in your life might tempt you into wanting a cigarette.  Never have "just one" cigarette. It leads to wanting another and another. Remind yourself of your decision to quit.  Change habits associated with smoking. If you smoked while driving or when feeling stressed, try other activities to replace smoking. Stand up when drinking your coffee. Brush your teeth after eating. Sit in a different chair when you read the paper. Avoid alcohol while trying to quit, and try to drink fewer caffeinated beverages. Alcohol and caffeine may urge you to  smoke.  Avoid foods and drinks that can trigger a desire to smoke, such as sugary or spicy foods and alcohol.  Ask people who smoke not to smoke around you.  Have something planned to do right after eating or having a cup of coffee. For example, plan to take a walk or exercise.  Try a relaxation exercise to calm you down and decrease your stress. Remember, you may be tense and nervous for the first 2 weeks after you quit, but this will pass.  Find new activities to keep your hands busy. Play with a pen, coin, or rubber band. Doodle or draw things on paper.  Brush your teeth right after eating. This will help cut down on the craving for the taste of tobacco after meals. You can also try mouthwash.   Use oral substitutes in place of cigarettes. Try using lemon drops, carrots, cinnamon sticks, or chewing gum. Keep them handy so they are available when you have the urge to smoke.  When you have the urge to smoke, try deep breathing.  Designate your home as a nonsmoking area.  If you are a heavy smoker, ask your health care provider about a prescription for nicotine chewing gum. It can ease your withdrawal from nicotine.  Reward yourself. Set aside the cigarette money you save and buy yourself something nice.  Look for support from others. Join a support group or smoking cessation program. Ask someone at home or at work to help you with your plan   to quit smoking.  Always ask yourself, "Do I need this cigarette or is this just a reflex?" Tell yourself, "Today, I choose not to smoke," or "I do not want to smoke." You are reminding yourself of your decision to quit.  Do not replace cigarette smoking with electronic cigarettes (commonly called e-cigarettes). The safety of e-cigarettes is unknown, and some may contain harmful chemicals.  If you relapse, do not give up! Plan ahead and think about what you will do the next time you get the urge to smoke. HOW WILL I FEEL WHEN I QUIT SMOKING? You  may have symptoms of withdrawal because your body is used to nicotine (the addictive substance in cigarettes). You may crave cigarettes, be irritable, feel very hungry, cough often, get headaches, or have difficulty concentrating. The withdrawal symptoms are only temporary. They are strongest when you first quit but will go away within 10-14 days. When withdrawal symptoms occur, stay in control. Think about your reasons for quitting. Remind yourself that these are signs that your body is healing and getting used to being without cigarettes. Remember that withdrawal symptoms are easier to treat than the major diseases that smoking can cause.  Even after the withdrawal is over, expect periodic urges to smoke. However, these cravings are generally short lived and will go away whether you smoke or not. Do not smoke! WHAT RESOURCES ARE AVAILABLE TO HELP ME QUIT SMOKING? Your health care provider can direct you to community resources or hospitals for support, which may include:  Group support.  Education.  Hypnosis.  Therapy.   This information is not intended to replace advice given to you by your health care provider. Make sure you discuss any questions you have with your health care provider.   Document Released: 12/18/2003 Document Revised: 04/11/2014 Document Reviewed: 09/06/2012 Elsevier Interactive Patient Education [employer].

## 2015-05-28 NOTE — Progress Notes (Signed)
Internal Medicine Clinic Attending  Case discussed with Dr. Hoffman soon after the resident saw the patient.  We reviewed the resident's history and exam and pertinent patient test results.  I agree with the assessment, diagnosis, and plan of care documented in the resident's note. 

## 2015-05-28 NOTE — Progress Notes (Signed)
[place] [phone]   [phone]  OFFICE PROGRESS NOTE  Karren Cobble, MD [address] [place] [address]  DIAGNOSIS: Stage IIA (T2b, N0, M0) non-small cell lung cancer, adenocarcinoma with negative EGFR mutation and negative ALK gene translocation, diagnosed in August 2016 and presented with left lower lobe lung mass.   PRIOR THERAPY: Curative radiotherapy to the left lower lobe lung mass under the care of Dr. Pablo Ledger  CURRENT THERAPY: Observation  INTERVAL HISTORY: [name] 60 y.o. female returns to the clinic today for follow-up visit accompanied by 2 family members. The patient is feeling fine today with no specific complaints except for the baseline shortness of breath and she is currently on home oxygen. She was using her oxygen intermittently and her oxygen saturation was down to 85%. Her blood pressure is also low 92/35 but she is on Lasix 80 mg a.m. and 40 mg p.m. She was seen yesterday by her primary care physician for chest congestion and she was started on Zithromax and cough syrup. She denied having any significant chest pain, or hemoptysis. She has no weight loss or night sweats. She has no nausea or vomiting. She had repeat CT scan of the chest last month and she is here for evaluation and discussion of her scan results.  MEDICAL HISTORY: Past Medical History  Diagnosis Date  . Coronary artery disease     S/P PCI of LAD with DES (12/2008). Total occlusion of RCA noted at that time., medically managed. ACS ruled out 03/2009 with Lexiscan myoview . Followed by [facility].  . Pulmonary hypertension ([facility])     2-D Echo (71/2197) - Systolic pressure was moderately increased. PA peak pressure  87mHg. 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 Followed by Dr. WJoya Gaskins([facility]  . Diastolic dysfunction     2-D Echo (12/2008) -  Normal LV Systolic funciton with EF 60-65%. Grade 1 diastolid dysfunction. No regional wall motion abnormalities. Moderate pulmonary HTN with PA peak pressure 590mg.  . Marland KitchenOPD (chronic obstructive pulmonary disease) (HCC)     Severe. Gold Stage IV.  PFTs (12/2008) - severe obstructive airway disease. Active tobacco use. Requires 4L O2 at home.  . Pulmonary nodule, right     Small right middle lobe nodule. Stable as of 12/2008.  . Marland Kitchenrediabetes     HgbA1c 6.4 (12/2008)  . Hx MRSA infection     Recurrent MRSA thigh abscesses.  . Obesity   . Hyperlipidemia   . GERD (gastroesophageal reflux disease)     S/P Nissen fundoplication.  . CHF (congestive heart failure) ([facility] [place]  . On home oxygen therapy since 2010    4L all the time  . Shortness of breath dyspnea     with a lot of exertion; if fluid builds up  . Pneumonia   . Depression   . Full dentures   . Chronic bronchitis ([facility]    "get it most years" (03/03/2015)  . Lung cancer ([facility]    "left"  . History of hiatal hernia   . Arthritis     "some scattered" (03/03/2015)    ALLERGIES:  is allergic to fluconazole and atorvastatin.  MEDICATIONS:  Current Outpatient Prescriptions  Medication Sig Dispense Refill  . albuterol (PROVENTIL) (2.5 MG/3ML) 0.083% nebulizer solution Take 3 mLs (2.5 mg total) by nebulization every 6 (six) hours as needed  for shortness of breath. 360 mL 3  . albuterol (VENTOLIN HFA) 108 (90 BASE) MCG/ACT inhaler Inhale 2 puffs into the lungs every 6 (six) hours as needed for shortness of breath. 18 g 11  . aspirin 81 MG chewable tablet Chew 1 tablet (81 mg total) by mouth at bedtime. 90 tablet 3  . Aspirin-Salicylamide-Caffeine (BC HEADACHE POWDER PO) Take 1 packet by mouth 3 (three) times daily as needed (pain).    . Fluticasone-Salmeterol (ADVAIR DISKUS) 250-50 MCG/DOSE AEPB Inhale 1 puff into the lungs 2 (two) times daily. 60 each 5  . furosemide (LASIX) 40 MG tablet Take 1-2 tablets (40-80 mg total) by mouth as  directed. Take 80 mg (2 tabs) in the AM and 40 mg (1 tab) in the PM 90 tablet 3  . gabapentin (NEURONTIN) 300 MG capsule Take 300 mg by mouth at bedtime.    Marland Kitchen guaiFENesin-codeine 100-10 MG/5ML syrup Take 5 mLs by mouth every 6 (six) hours as needed for cough. 120 mL 0  . lovastatin (MEVACOR) 40 MG tablet Take 1 tablet (40 mg total) by mouth at bedtime. 30 tablet 11  . metolazone (ZAROXOLYN) 2.5 MG tablet TAKE 1 TABLET BY MOUTH AS NEEDED(FOR 5 LBS WEIGHT GAIN) 90 tablet 3  . OXYGEN Inhale 4.5 L into the lungs continuous.     . pantoprazole (PROTONIX) 40 MG tablet Take 1 tablet (40 mg total) by mouth daily. 30 tablet 3  . potassium chloride SA (K-DUR,KLOR-CON) 20 MEQ tablet Take 1-2 tablets (20-40 mEq total) by mouth as directed. Take 40 meq in the am and 20 meq in the PM May take add'l 20 meq prn with Metolazone 105 tablet 3  . tiotropium (SPIRIVA HANDIHALER) 18 MCG inhalation capsule Place 1 capsule (18 mcg total) into inhaler and inhale daily. 30 capsule 12  . azithromycin (ZITHROMAX Z-PAK) 250 MG tablet Take 2 tablets (500 mg) on  Day 1,  followed by 1 tablet (250 mg) once daily on Days 2 through 5. (Patient not taking: Reported on 05/28/2015) 6 each 0   No current facility-administered medications for this visit.    SURGICAL HISTORY:  Past Surgical History  Procedure Laterality Date  . Nissen fundoplication    . Right heart catheterization N/A 11/09/2012    Procedure: RIGHT HEART CATH;  Surgeon: Larey Dresser, MD;  Location[facility];  Service: Cardiovascular;  Laterality: N/A;  . Appendectomy    . Tonsillectomy    . Back surgery    . Knee arthroscopy Right ~ 2000  . Tubal ligation  1979  . Video bronchoscopy with endobronchial ultrasound N/A 12/15/2014    Procedure: VIDEO BRONCHOSCOPY WITH ENDOBRONCHIAL ULTRASOUND;  Surgeon: Ivin Poot, MD;  Location: [facility];  Service: Thoracic;  Laterality: N/A;  . Coronary angioplasty with stent placement  2010    [facility]; RCA  . Bilateral  oophorectomy  1979  . Hernia repair    . Breast lumpectomy Right 1990's     (biopsy negative)  . Lumbar disc surgery  2001    "herniated discs; both by [facility], Dr. Lorin Mercy"  . Abdominal hysterectomy  1980's    "still have my cervix"    REVIEW OF SYSTEMS:  A comprehensive review of systems was negative except for: Constitutional: positive for fatigue Respiratory: positive for cough, dyspnea on exertion and sputum Swelling of the lower extremities   PHYSICAL EXAMINATION: General appearance: alert, cooperative, fatigued and no distress Head: Normocephalic, without obvious abnormality, atraumatic Neck: no adenopathy, no JVD,  supple, symmetrical, trachea midline and thyroid not enlarged, symmetric, no tenderness/mass/nodules Lymph nodes: Cervical, supraclavicular, and axillary nodes normal. Resp: wheezes bilaterally Back: symmetric, no curvature. ROM normal. No CVA tenderness. Cardio: regular rate and rhythm, S1, S2 normal, no murmur, click, rub or gallop GI: soft, non-tender; bowel sounds normal; no masses,  no organomegaly Extremities: edema 2+ Neurologic: Alert and oriented X 3, normal strength and tone. Normal symmetric reflexes. Normal coordination and gait  ECOG PERFORMANCE STATUS: 1 - Symptomatic but completely ambulatory  Blood pressure 92/35, pulse 95, temperature 97.8 F (36.6 C), temperature source Oral, resp. rate 16, height _0  (1.651 m), weight 239 lb 6.4 oz (108.591 kg), SpO2 85 %.  LABORATORY DATA: Lab Results  Component Value Date   WBC 11.7* 05/28/2015   HGB 14.1 05/28/2015   HCT 42.7 05/28/2015   MCV 91.0 05/28/2015   PLT 246 05/28/2015      Chemistry      Component Value Date/Time   NA 136 05/13/2015 1148   NA 142 04/16/2015 1545   K 3.7 05/13/2015 1148   CL 89* 05/13/2015 1148   CO2 38* 05/13/2015 1148   BUN 5* 05/13/2015 1148   BUN 14 04/16/2015 1545   CREATININE 0.89 05/13/2015 1148   CREATININE 0.87 10/08/2014 1031      Component Value  Date/Time   CALCIUM 9.1 05/13/2015 1148   ALKPHOS 180* 05/04/2015 1453   AST 19 05/04/2015 1453   ALT 20 05/04/2015 1453   BILITOT 0.4 05/04/2015 1453       RADIOGRAPHIC STUDIES: Dg Chest 2 View  05/04/2015  CLINICAL DATA:  Volume overload.  History of COPD. EXAM: CHEST  2 VIEW COMPARISON:  April 12, 2015 FINDINGS: The heart size and mediastinal contours are stable. The heart size is enlarged. There is patchy opacity of bilateral lung bases. There is mild increased pulmonary interstitium bilaterally. The visualized skeletal structures are stable. IMPRESSION: Patchy consolidation of both lung bases suspicious for pneumonia. Increased pulmonary interstitium bilaterally consistent with mild pulmonary interstitial edema. Electronically Signed   By: Abelardo Diesel M.D.   On: 05/04/2015 14:13   ASSESSMENT AND PLAN: This is a very pleasant 60 years old white female recently diagnosed with a stage IIA non-small cell lung cancer. Biopsy of the mediastinal lymph nodes showed no malignant cells. She is status post curative radiotherapy to the left lower lobe lung mass. Restaging CT scan of the chest showed no evidence for disease progression. I discussed the scan results with the patient and her family. I recommended for her to continue on observation with repeat CT scan of the chest in 6 months. The patient will continue on home oxygen for her shortness breath. For the lower extremity edema, she is currently on Lasix and followed by her primary care physician. She was advised to call immediately if she has any concerning symptoms in the interval. The patient voices understanding of current disease status and treatment options and is in agreement with the current care plan.  All questions were answered. The patient knows to call the clinic with any problems, questions or concerns. We can certainly see the patient much sooner if necessary.  Disclaimer: This note was dictated with voice recognition  software. Similar sounding words can inadvertently be transcribed and may not be corrected upon review.

## 2015-05-29 ENCOUNTER — Encounter: Payer: Self-pay | Admitting: *Deleted

## 2015-05-29 DIAGNOSIS — I8311 Varicose veins of right lower extremity with inflammation: Secondary | ICD-10-CM | POA: Diagnosis not present

## 2015-05-29 DIAGNOSIS — I8312 Varicose veins of left lower extremity with inflammation: Secondary | ICD-10-CM | POA: Diagnosis not present

## 2015-05-29 DIAGNOSIS — I2510 Atherosclerotic heart disease of native coronary artery without angina pectoris: Secondary | ICD-10-CM | POA: Diagnosis not present

## 2015-05-29 DIAGNOSIS — E119 Type 2 diabetes mellitus without complications: Secondary | ICD-10-CM | POA: Diagnosis not present

## 2015-05-29 DIAGNOSIS — F329 Major depressive disorder, single episode, unspecified: Secondary | ICD-10-CM | POA: Diagnosis not present

## 2015-05-29 DIAGNOSIS — J449 Chronic obstructive pulmonary disease, unspecified: Secondary | ICD-10-CM | POA: Diagnosis not present

## 2015-05-29 DIAGNOSIS — M150 Primary generalized (osteo)arthritis: Secondary | ICD-10-CM | POA: Diagnosis not present

## 2015-05-29 DIAGNOSIS — I5031 Acute diastolic (congestive) heart failure: Secondary | ICD-10-CM | POA: Diagnosis not present

## 2015-05-29 DIAGNOSIS — K219 Gastro-esophageal reflux disease without esophagitis: Secondary | ICD-10-CM | POA: Diagnosis not present

## 2015-06-01 ENCOUNTER — Telehealth: Payer: Self-pay | Admitting: Internal Medicine

## 2015-06-01 DIAGNOSIS — K219 Gastro-esophageal reflux disease without esophagitis: Secondary | ICD-10-CM | POA: Diagnosis not present

## 2015-06-01 DIAGNOSIS — I8312 Varicose veins of left lower extremity with inflammation: Secondary | ICD-10-CM | POA: Diagnosis not present

## 2015-06-01 DIAGNOSIS — M150 Primary generalized (osteo)arthritis: Secondary | ICD-10-CM | POA: Diagnosis not present

## 2015-06-01 DIAGNOSIS — J449 Chronic obstructive pulmonary disease, unspecified: Secondary | ICD-10-CM | POA: Diagnosis not present

## 2015-06-01 DIAGNOSIS — E119 Type 2 diabetes mellitus without complications: Secondary | ICD-10-CM | POA: Diagnosis not present

## 2015-06-01 DIAGNOSIS — I5031 Acute diastolic (congestive) heart failure: Secondary | ICD-10-CM | POA: Diagnosis not present

## 2015-06-01 DIAGNOSIS — I2510 Atherosclerotic heart disease of native coronary artery without angina pectoris: Secondary | ICD-10-CM | POA: Diagnosis not present

## 2015-06-01 DIAGNOSIS — I8311 Varicose veins of right lower extremity with inflammation: Secondary | ICD-10-CM | POA: Diagnosis not present

## 2015-06-01 DIAGNOSIS — F329 Major depressive disorder, single episode, unspecified: Secondary | ICD-10-CM | POA: Diagnosis not present

## 2015-06-01 NOTE — Telephone Encounter (Signed)
Lvm advising appts 8/23 lab @ 10am and 8/30 md @ 11.15am. Also advised, md ordered ct prior to md appt and that c-sched will call with that appt.

## 2015-06-03 ENCOUNTER — Other Ambulatory Visit: Payer: Self-pay | Admitting: Internal Medicine

## 2015-06-03 ENCOUNTER — Telehealth: Payer: Self-pay | Admitting: Licensed Clinical Social Worker

## 2015-06-03 DIAGNOSIS — J449 Chronic obstructive pulmonary disease, unspecified: Secondary | ICD-10-CM

## 2015-06-03 NOTE — Telephone Encounter (Signed)
CSW received voice mail from [name] requesting return call.  CSW placed called to pt.  CSW left message requesting return call. CSW provided contact hours and phone number.

## 2015-06-03 NOTE — Telephone Encounter (Signed)
I completed the form last week and put it in the usual box for completed paperwork.  I suspect it has not yet been processed.  I filled out the paperwork as directed.  Unfortunately, her explanation does not meet the required criteria for a medical waiver for accomodation.

## 2015-06-03 NOTE — Telephone Encounter (Signed)
CSW received call from Ms. Garn.  Pt states during her last office visit to [facility], she provided a form from her apartment complex for PCP to sign indicating her need to have her pet to stay with her. Ms. Hollar placed call to CSW to inquire if form had been completed.  Pt states she needs her dog to stay with her as the dog provides the only motivation for her to get out of the house and exercises.  Ms. Santoni states she walks the dog two times a day.  Form has not been scanned in chart.  Pt notified this worker will route to PCP for inquiry.  CSW placed call to apartment complex, [employer] [phone] requesting a fax copy of this form in case PCP is in need of another copy.

## 2015-06-04 DIAGNOSIS — I2510 Atherosclerotic heart disease of native coronary artery without angina pectoris: Secondary | ICD-10-CM | POA: Diagnosis not present

## 2015-06-04 DIAGNOSIS — J449 Chronic obstructive pulmonary disease, unspecified: Secondary | ICD-10-CM | POA: Diagnosis not present

## 2015-06-04 DIAGNOSIS — M150 Primary generalized (osteo)arthritis: Secondary | ICD-10-CM | POA: Diagnosis not present

## 2015-06-04 DIAGNOSIS — F329 Major depressive disorder, single episode, unspecified: Secondary | ICD-10-CM | POA: Diagnosis not present

## 2015-06-04 DIAGNOSIS — I8312 Varicose veins of left lower extremity with inflammation: Secondary | ICD-10-CM | POA: Diagnosis not present

## 2015-06-04 DIAGNOSIS — E119 Type 2 diabetes mellitus without complications: Secondary | ICD-10-CM | POA: Diagnosis not present

## 2015-06-04 DIAGNOSIS — I5031 Acute diastolic (congestive) heart failure: Secondary | ICD-10-CM | POA: Diagnosis not present

## 2015-06-04 DIAGNOSIS — K219 Gastro-esophageal reflux disease without esophagitis: Secondary | ICD-10-CM | POA: Diagnosis not present

## 2015-06-04 DIAGNOSIS — I8311 Varicose veins of right lower extremity with inflammation: Secondary | ICD-10-CM | POA: Diagnosis not present

## 2015-06-04 NOTE — Telephone Encounter (Signed)
CSW placed to [name] Message left that form was completed and faxed x 3 to the fax number on the [employer] form.  CSW offered to mail copy to patient or directly to the Management company.  Pt notified request does not meet criteria for medical waiver of accommodation.

## 2015-06-04 NOTE — Telephone Encounter (Signed)
Thank you.  The front office provided the form and it was faxed to complex.  I will notify the patient.  Thank you.

## 2015-06-04 NOTE — Telephone Encounter (Signed)
Received staff message from desk nurse asking that patient be called to confirm appointments. Called patient re previous message below for August appointments. Per patient she received the message and the appointments are fine.

## 2015-06-05 ENCOUNTER — Other Ambulatory Visit: Payer: Self-pay

## 2015-06-05 DIAGNOSIS — J449 Chronic obstructive pulmonary disease, unspecified: Secondary | ICD-10-CM | POA: Diagnosis not present

## 2015-06-05 DIAGNOSIS — J9600 Acute respiratory failure, unspecified whether with hypoxia or hypercapnia: Secondary | ICD-10-CM | POA: Diagnosis not present

## 2015-06-05 DIAGNOSIS — R0902 Hypoxemia: Secondary | ICD-10-CM | POA: Diagnosis not present

## 2015-06-05 NOTE — Patient Outreach (Signed)
Unsuccessful attempt made to contact patient via telephone for community care coordination. HIPPA compliant message left with this RNCM's contact information.    Plan: udpate patient's primary care case manager advising on unsuccessful attempt to contact.

## 2015-06-08 ENCOUNTER — Other Ambulatory Visit: Payer: Self-pay | Admitting: *Deleted

## 2015-06-08 NOTE — Patient Outreach (Signed)
Final transition of care call (discharged 2/1).  Followed up with pt as coworker Pam RN CM (covering for current RN CM) made an attempt to f/u with pt 3/3.    Pt reports doing good, to move today into apartment complex [employer]).  Pt reports wounds to bilateral legs better, HH RN coming twice a week.  Pt reports edema in legs is gone.  RN CM discussed with pt completion of transition of care program, plan to refer her to  [facility] for ongoing  community nurse case management  as she is covering the area she is moving to.  Pt agreed to the plan.  Pt reports she is also going to participate in HF program (Paramedic f/u with her).      Plan to send in basket to [facility] care management assistant - transfer to new RN CM.         Zara Chess.   [facility]  [phone]

## 2015-06-10 DIAGNOSIS — F329 Major depressive disorder, single episode, unspecified: Secondary | ICD-10-CM | POA: Diagnosis not present

## 2015-06-10 DIAGNOSIS — K219 Gastro-esophageal reflux disease without esophagitis: Secondary | ICD-10-CM | POA: Diagnosis not present

## 2015-06-10 DIAGNOSIS — J449 Chronic obstructive pulmonary disease, unspecified: Secondary | ICD-10-CM | POA: Diagnosis not present

## 2015-06-10 DIAGNOSIS — E119 Type 2 diabetes mellitus without complications: Secondary | ICD-10-CM | POA: Diagnosis not present

## 2015-06-10 DIAGNOSIS — M150 Primary generalized (osteo)arthritis: Secondary | ICD-10-CM | POA: Diagnosis not present

## 2015-06-10 DIAGNOSIS — I2510 Atherosclerotic heart disease of native coronary artery without angina pectoris: Secondary | ICD-10-CM | POA: Diagnosis not present

## 2015-06-10 DIAGNOSIS — I8311 Varicose veins of right lower extremity with inflammation: Secondary | ICD-10-CM | POA: Diagnosis not present

## 2015-06-10 DIAGNOSIS — I5031 Acute diastolic (congestive) heart failure: Secondary | ICD-10-CM | POA: Diagnosis not present

## 2015-06-10 DIAGNOSIS — I8312 Varicose veins of left lower extremity with inflammation: Secondary | ICD-10-CM | POA: Diagnosis not present

## 2015-06-11 ENCOUNTER — Encounter (HOSPITAL_COMMUNITY): Payer: Commercial Managed Care - HMO | Admitting: Internal Medicine

## 2015-06-12 IMAGING — CR DG CHEST 1V PORT
1 series · 1 of 1 positions shown · non-contrast
Comparison: 08/11/2012

CLINICAL DATA: Respiratory distress, shortness of breath.

PORTABLE CHEST - 1 VIEW

[AP]
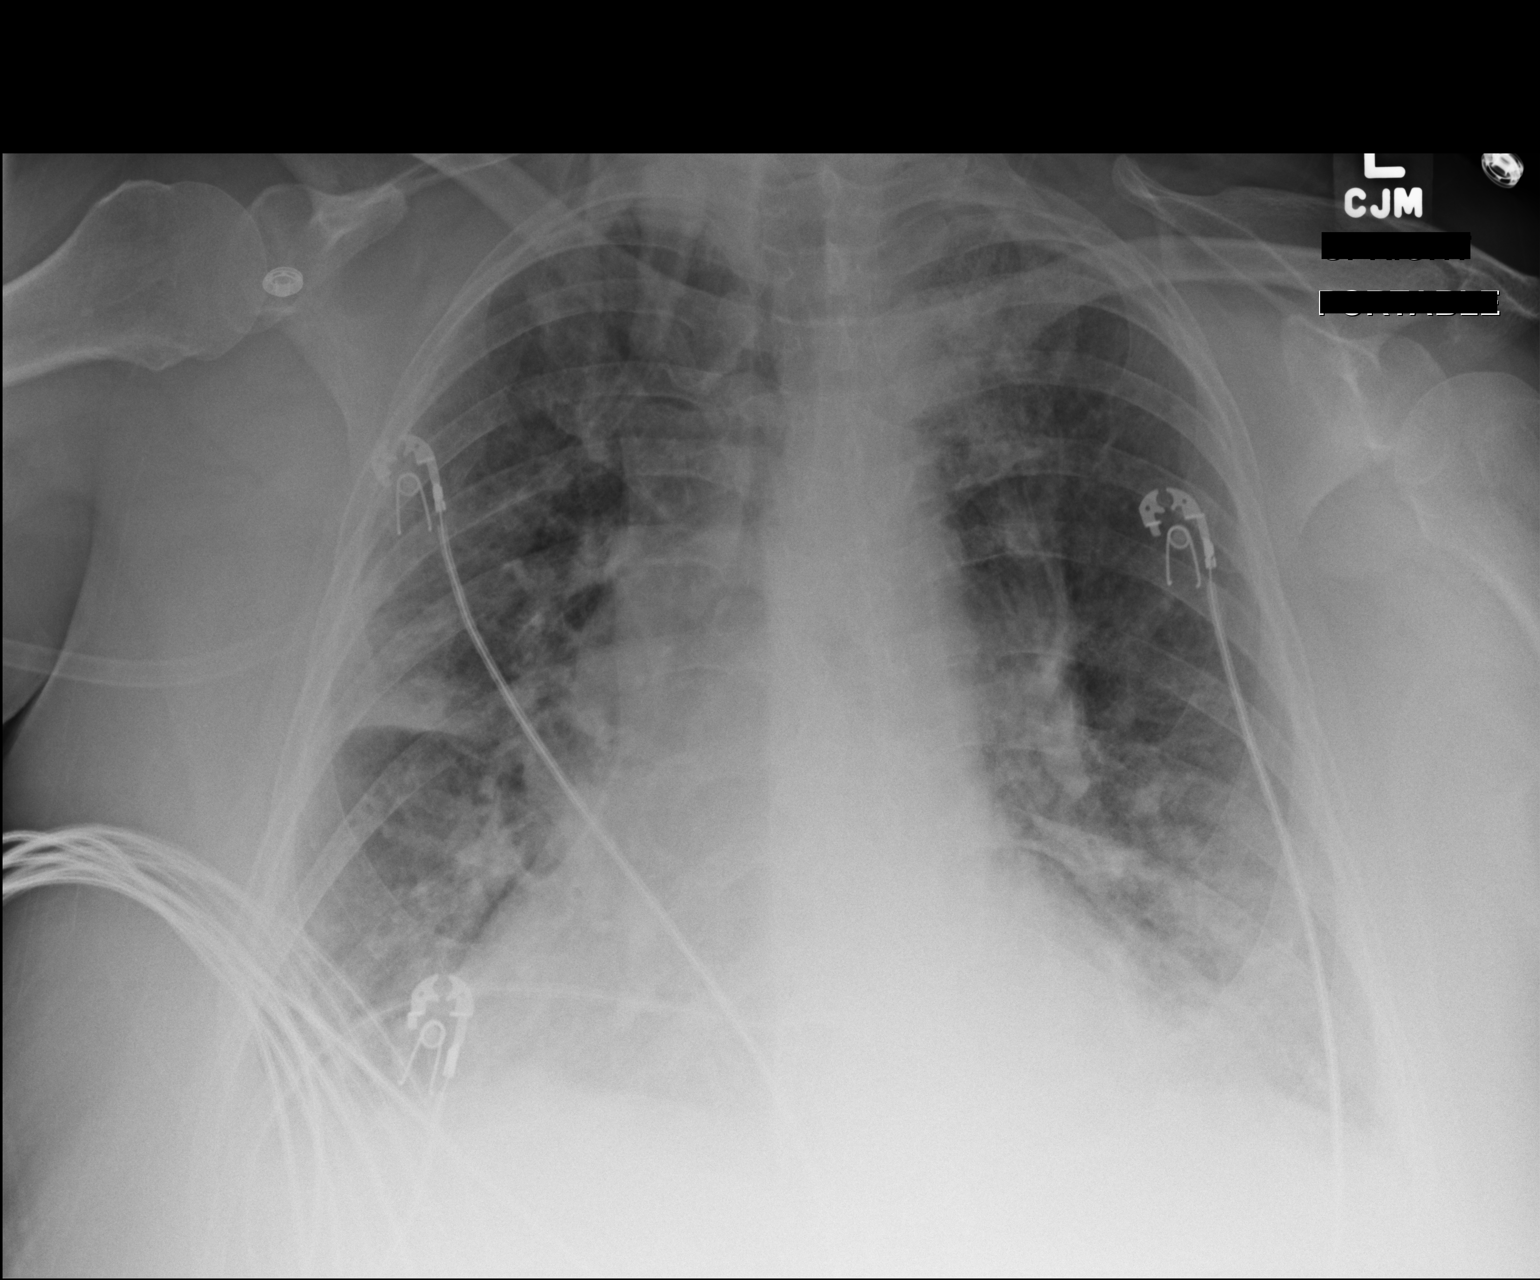

[1 of 1 positions shown; findings below may reference images not displayed]

FINDINGS: Cardiomegaly.  Vascular congestion.  Bilateral lower lobe
atelectasis or infiltrates.  Airspace opacity in the right upper
lobe peripherally.  Small bilateral pleural effusions.  Diffuse
interstitial prominence could reflect interstitial edema.
IMPRESSION: Cardiomegaly with vascular congestion and possible interstitial
edema.

More focal opacities in the lung bases and inferior right upper
lobe.  This could represent atelectasis or infiltrates.

Small effusions.

## 2015-06-13 IMAGING — CR DG CHEST 1V PORT
1 series · 1 of 1 positions shown · non-contrast
Comparison: 11/05/2012

CLINICAL DATA: Pneumonia.

PORTABLE CHEST - 1 VIEW

[AP]
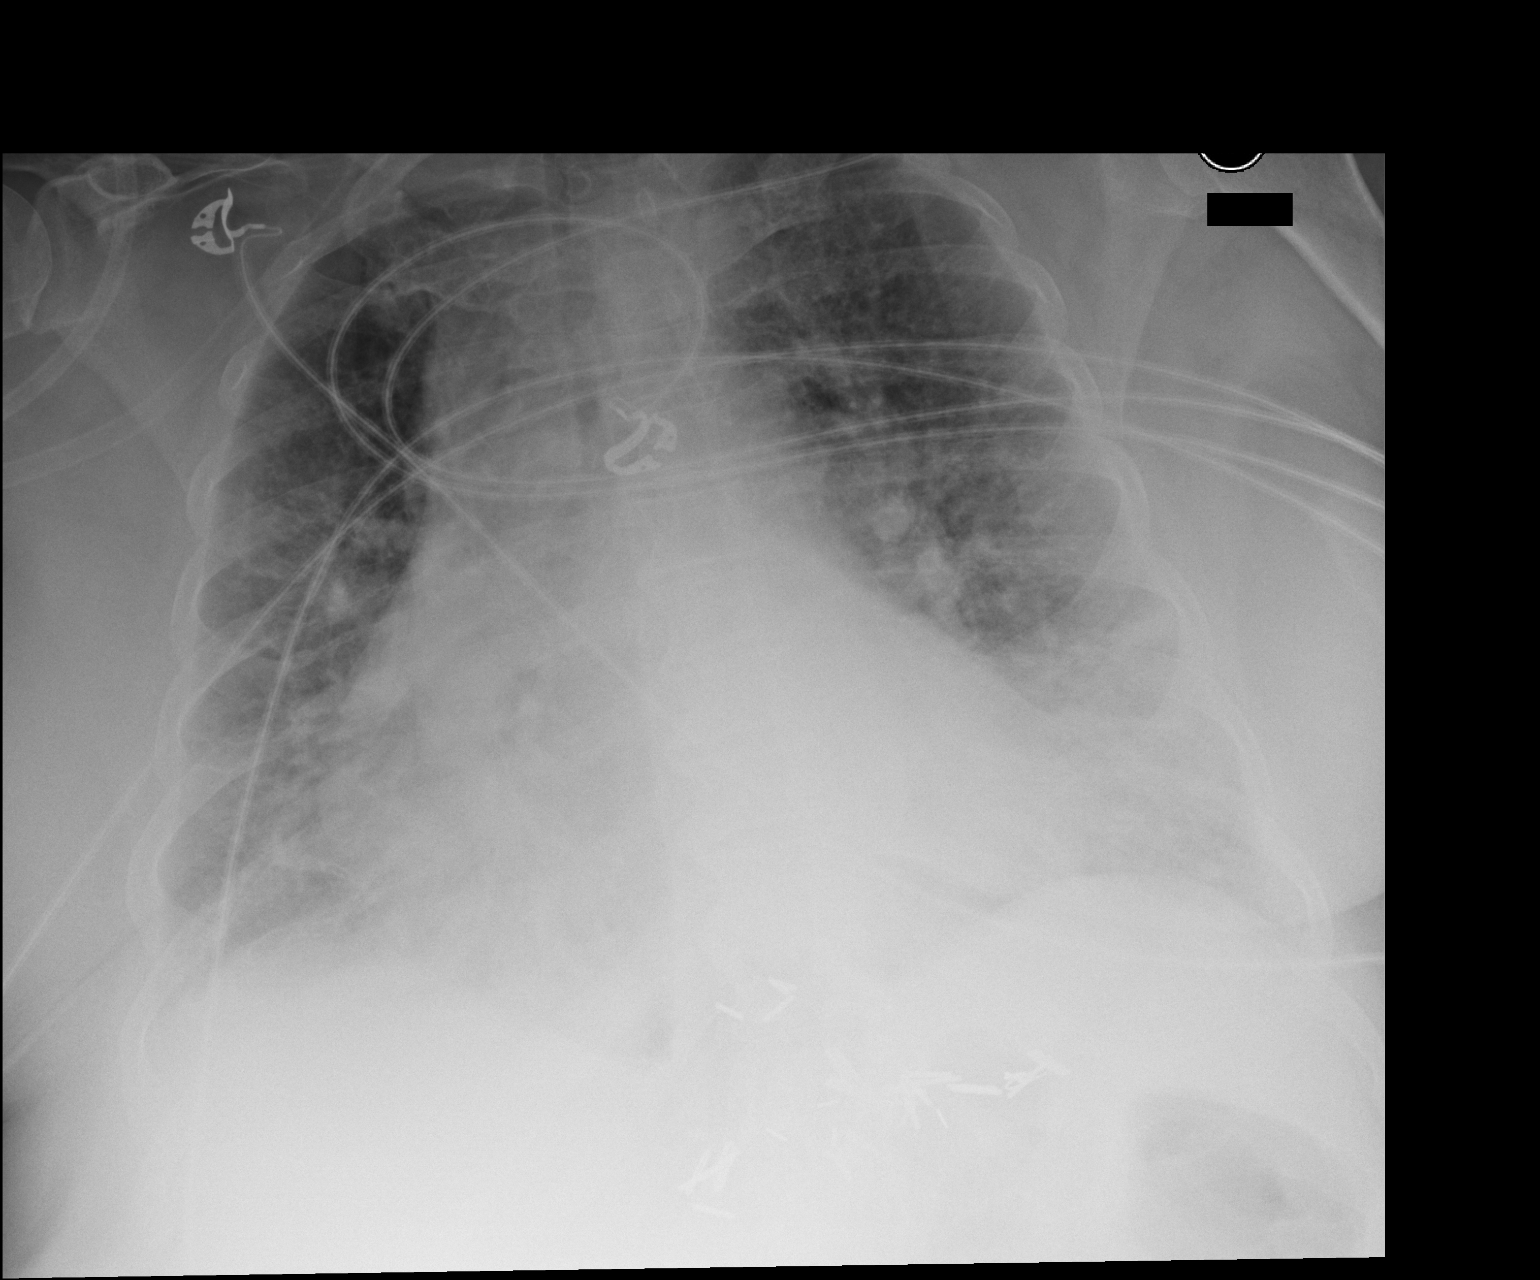

[1 of 1 positions shown; findings below may reference images not displayed]

FINDINGS: Cardiomegaly with vascular congestion.  Diffuse bilateral
airspace opacities throughout the lungs.  This presumably
represents edema.  No visible significant effusion.  No acute bony
abnormality. Improvement in the focal opacity in the right upper
lobe.
IMPRESSION: Cardiomegaly, vascular congestion and bilateral airspace opacities,
most likely edema.

## 2015-06-13 IMAGING — CR DG CHEST 2V
2 series · 2 of 2 positions shown · non-contrast
Comparison: 11/07/2010 [DATE] hours

CLINICAL DATA: Dyspnea

CHEST - 2 VIEW

[w chest pa]
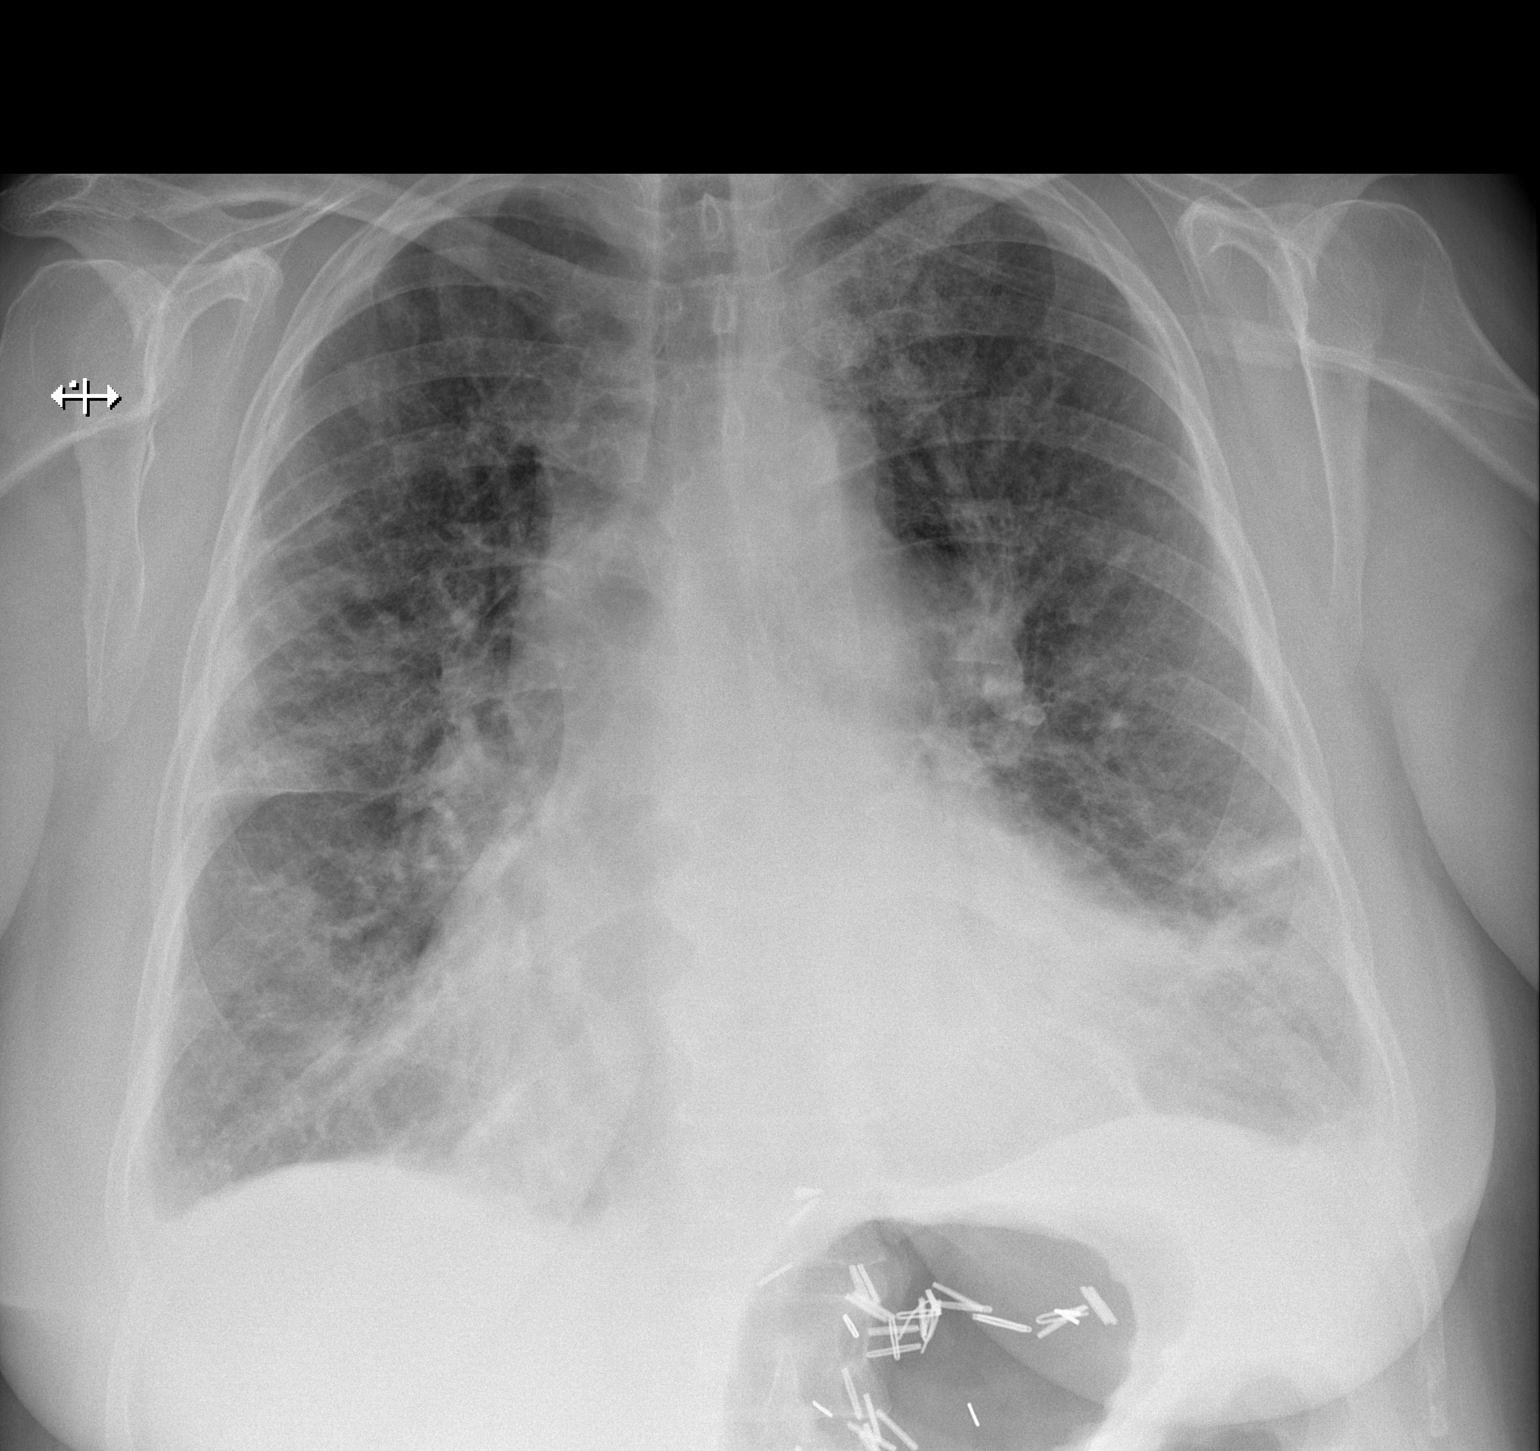

[w chest lat]
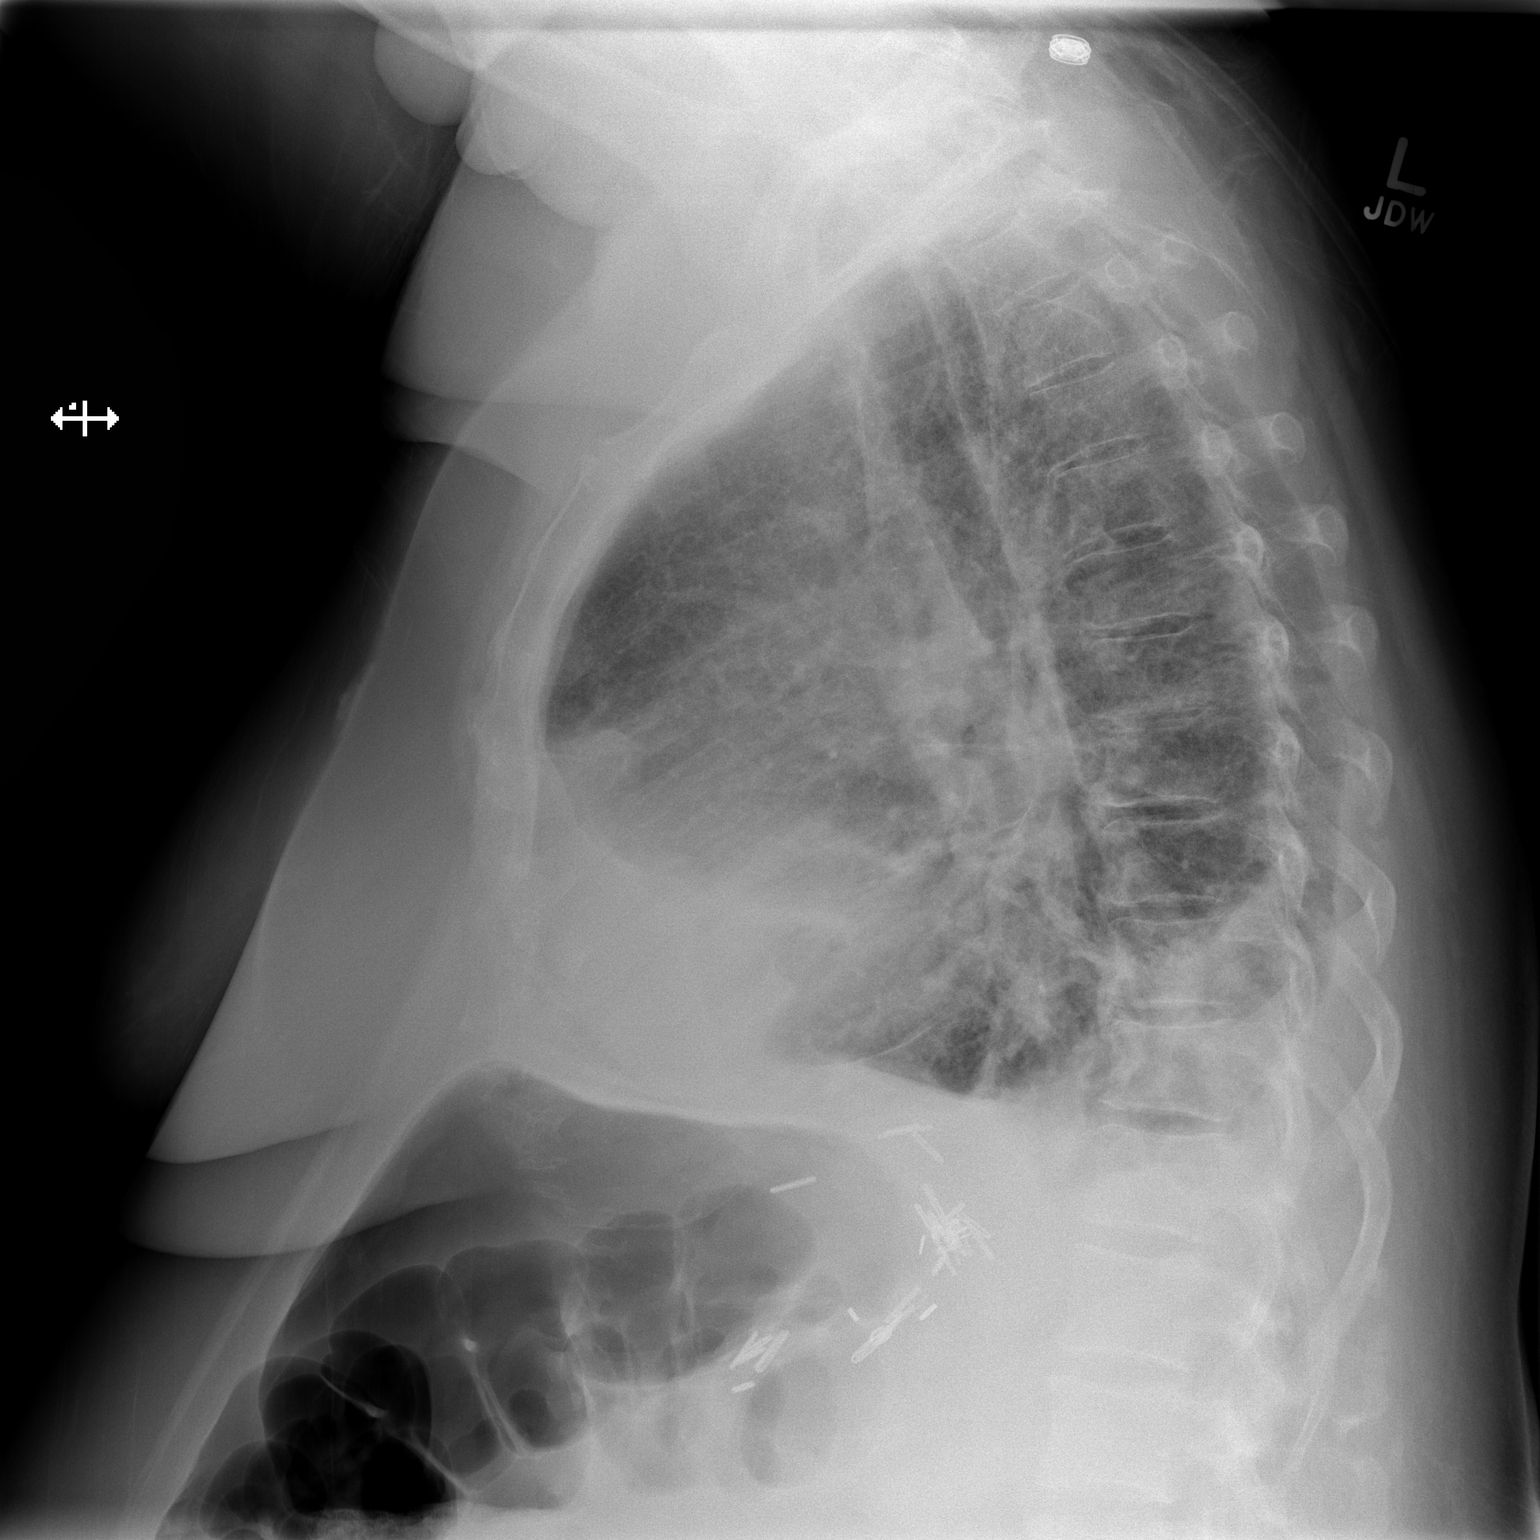

[2 of 2 positions shown; findings below may reference images not displayed]

FINDINGS: The cardiac shadow is again enlarged in size.  Patchy
bilateral air elects cyst is noted.  There is also evidence of
interstitial edema.  The overall appearance is slightly increased
when compared the prior exam.
IMPRESSION: Increasing bilateral atelectasis as well as some pulmonary edema
changes.

## 2015-06-15 DIAGNOSIS — I8311 Varicose veins of right lower extremity with inflammation: Secondary | ICD-10-CM | POA: Diagnosis not present

## 2015-06-15 DIAGNOSIS — I2510 Atherosclerotic heart disease of native coronary artery without angina pectoris: Secondary | ICD-10-CM | POA: Diagnosis not present

## 2015-06-15 DIAGNOSIS — I8312 Varicose veins of left lower extremity with inflammation: Secondary | ICD-10-CM | POA: Diagnosis not present

## 2015-06-15 DIAGNOSIS — K219 Gastro-esophageal reflux disease without esophagitis: Secondary | ICD-10-CM | POA: Diagnosis not present

## 2015-06-15 DIAGNOSIS — J449 Chronic obstructive pulmonary disease, unspecified: Secondary | ICD-10-CM | POA: Diagnosis not present

## 2015-06-15 DIAGNOSIS — F329 Major depressive disorder, single episode, unspecified: Secondary | ICD-10-CM | POA: Diagnosis not present

## 2015-06-15 DIAGNOSIS — E119 Type 2 diabetes mellitus without complications: Secondary | ICD-10-CM | POA: Diagnosis not present

## 2015-06-15 DIAGNOSIS — I5031 Acute diastolic (congestive) heart failure: Secondary | ICD-10-CM | POA: Diagnosis not present

## 2015-06-15 DIAGNOSIS — M150 Primary generalized (osteo)arthritis: Secondary | ICD-10-CM | POA: Diagnosis not present

## 2015-06-18 DIAGNOSIS — I2510 Atherosclerotic heart disease of native coronary artery without angina pectoris: Secondary | ICD-10-CM | POA: Diagnosis not present

## 2015-06-18 DIAGNOSIS — K219 Gastro-esophageal reflux disease without esophagitis: Secondary | ICD-10-CM | POA: Diagnosis not present

## 2015-06-18 DIAGNOSIS — F329 Major depressive disorder, single episode, unspecified: Secondary | ICD-10-CM | POA: Diagnosis not present

## 2015-06-18 DIAGNOSIS — I8312 Varicose veins of left lower extremity with inflammation: Secondary | ICD-10-CM | POA: Diagnosis not present

## 2015-06-18 DIAGNOSIS — M150 Primary generalized (osteo)arthritis: Secondary | ICD-10-CM | POA: Diagnosis not present

## 2015-06-18 DIAGNOSIS — I8311 Varicose veins of right lower extremity with inflammation: Secondary | ICD-10-CM | POA: Diagnosis not present

## 2015-06-18 DIAGNOSIS — J449 Chronic obstructive pulmonary disease, unspecified: Secondary | ICD-10-CM | POA: Diagnosis not present

## 2015-06-18 DIAGNOSIS — I5031 Acute diastolic (congestive) heart failure: Secondary | ICD-10-CM | POA: Diagnosis not present

## 2015-06-18 DIAGNOSIS — E119 Type 2 diabetes mellitus without complications: Secondary | ICD-10-CM | POA: Diagnosis not present

## 2015-06-22 DIAGNOSIS — K219 Gastro-esophageal reflux disease without esophagitis: Secondary | ICD-10-CM | POA: Diagnosis not present

## 2015-06-22 DIAGNOSIS — I2510 Atherosclerotic heart disease of native coronary artery without angina pectoris: Secondary | ICD-10-CM | POA: Diagnosis not present

## 2015-06-22 DIAGNOSIS — I8312 Varicose veins of left lower extremity with inflammation: Secondary | ICD-10-CM | POA: Diagnosis not present

## 2015-06-22 DIAGNOSIS — F329 Major depressive disorder, single episode, unspecified: Secondary | ICD-10-CM | POA: Diagnosis not present

## 2015-06-22 DIAGNOSIS — J449 Chronic obstructive pulmonary disease, unspecified: Secondary | ICD-10-CM | POA: Diagnosis not present

## 2015-06-22 DIAGNOSIS — I5031 Acute diastolic (congestive) heart failure: Secondary | ICD-10-CM | POA: Diagnosis not present

## 2015-06-22 DIAGNOSIS — I8311 Varicose veins of right lower extremity with inflammation: Secondary | ICD-10-CM | POA: Diagnosis not present

## 2015-06-22 DIAGNOSIS — M150 Primary generalized (osteo)arthritis: Secondary | ICD-10-CM | POA: Diagnosis not present

## 2015-06-22 DIAGNOSIS — E119 Type 2 diabetes mellitus without complications: Secondary | ICD-10-CM | POA: Diagnosis not present

## 2015-06-23 ENCOUNTER — Telehealth (HOSPITAL_COMMUNITY): Payer: Self-pay

## 2015-06-25 DIAGNOSIS — I8312 Varicose veins of left lower extremity with inflammation: Secondary | ICD-10-CM | POA: Diagnosis not present

## 2015-06-25 DIAGNOSIS — K219 Gastro-esophageal reflux disease without esophagitis: Secondary | ICD-10-CM | POA: Diagnosis not present

## 2015-06-25 DIAGNOSIS — E119 Type 2 diabetes mellitus without complications: Secondary | ICD-10-CM | POA: Diagnosis not present

## 2015-06-25 DIAGNOSIS — F329 Major depressive disorder, single episode, unspecified: Secondary | ICD-10-CM | POA: Diagnosis not present

## 2015-06-25 DIAGNOSIS — J449 Chronic obstructive pulmonary disease, unspecified: Secondary | ICD-10-CM | POA: Diagnosis not present

## 2015-06-25 DIAGNOSIS — M150 Primary generalized (osteo)arthritis: Secondary | ICD-10-CM | POA: Diagnosis not present

## 2015-06-25 DIAGNOSIS — I2510 Atherosclerotic heart disease of native coronary artery without angina pectoris: Secondary | ICD-10-CM | POA: Diagnosis not present

## 2015-06-25 DIAGNOSIS — I8311 Varicose veins of right lower extremity with inflammation: Secondary | ICD-10-CM | POA: Diagnosis not present

## 2015-06-25 DIAGNOSIS — I5031 Acute diastolic (congestive) heart failure: Secondary | ICD-10-CM | POA: Diagnosis not present

## 2015-06-29 ENCOUNTER — Inpatient Hospital Stay (HOSPITAL_COMMUNITY)
Admission: RE | Admit: 2015-06-29 | Payer: Commercial Managed Care - HMO | Source: Ambulatory Visit | Admitting: Internal Medicine

## 2015-07-03 ENCOUNTER — Other Ambulatory Visit: Payer: Self-pay | Admitting: Internal Medicine

## 2015-07-03 NOTE — Telephone Encounter (Signed)
Last appt was 05/27/15; no f/u appt scheduled.

## 2015-07-03 NOTE — Telephone Encounter (Signed)
Needs PCP appt next 90 days

## 2015-07-03 NOTE — Telephone Encounter (Signed)
Message sent to front office.

## 2015-07-06 ENCOUNTER — Telehealth: Payer: Self-pay | Admitting: *Deleted

## 2015-07-06 NOTE — Telephone Encounter (Signed)
Pt states her legs were doing so well that the una boots were discontinued, after appr 1 1/2 wks she noticed that ble were swelling and then last Thursday she noted red areas on ble, she thought they were mosquito bites but then realized that they were blisters, L leg has multiple blisters, R only has a couple. She has been using compression stockings but they are not working well, she is not sitting for long periods of time, she is up every hour to the bathroom. She is taking her medications as directed and when directed, she states she is doing everything she is ask to do. She is worried about this problem.

## 2015-07-07 ENCOUNTER — Ambulatory Visit (INDEPENDENT_AMBULATORY_CARE_PROVIDER_SITE_OTHER): Payer: Commercial Managed Care - HMO | Admitting: Internal Medicine

## 2015-07-07 ENCOUNTER — Encounter: Payer: Self-pay | Admitting: Internal Medicine

## 2015-07-07 ENCOUNTER — Telehealth: Payer: Self-pay

## 2015-07-07 VITALS — BP 116/52 | HR 97 | Temp 97.9°F | Wt 246.4 lb

## 2015-07-07 DIAGNOSIS — Z720 Tobacco use: Secondary | ICD-10-CM

## 2015-07-07 DIAGNOSIS — I8312 Varicose veins of left lower extremity with inflammation: Secondary | ICD-10-CM | POA: Diagnosis not present

## 2015-07-07 DIAGNOSIS — I8311 Varicose veins of right lower extremity with inflammation: Secondary | ICD-10-CM | POA: Diagnosis not present

## 2015-07-07 DIAGNOSIS — M793 Panniculitis, unspecified: Secondary | ICD-10-CM

## 2015-07-07 DIAGNOSIS — I8310 Varicose veins of unspecified lower extremity with inflammation: Secondary | ICD-10-CM

## 2015-07-07 NOTE — Telephone Encounter (Signed)
Dr. Gordy Levan pointed out that while in house this rx was a 1:1 ratio, called in rx given at discharge as 1:1

## 2015-07-07 NOTE — Assessment & Plan Note (Signed)
-  discussed smoking cessation

## 2015-07-07 NOTE — Telephone Encounter (Signed)
triamciolone 0.1% cream:eucerin cream order written 05/06/15 is not clear to pharmacy(patient just sent script to them yesterday)- they need to know the ratio of creams or if we would like to give one or the other.  Please send new rx to [facility] if applicable  (pt in office today)

## 2015-07-07 NOTE — Assessment & Plan Note (Signed)
Pt p/w "blisters" on her LE.  However, I was unable to appreciate any blisters.  She has been wearing compression socks intermittently but states that a band forms around her legs because they are too tight.  She reports purchasing them online and has never been measured.  Denies fever/chills.  She has not been weight herself daily and missed her appt with Dr. Tempie Hoist due to transportation?  She has gained 5 lbs since her last visit and was told to use metolazone if she gained 5 lbs.  However, she has also not been compliant with metolazone.  She reports compliance with lasix bid (80 in AM and 40 in PM).  She also reports watching her salt intake. -advised to f/u with Dr. Tempie Hoist  -she has transportation through [facility] and so this should not be an issue -advised that [facility] get properly [facility] for compression socks  -advised to take metolazone as directed and weight daily

## 2015-07-07 NOTE — Progress Notes (Signed)
Patient ID: [name], female   DOB: [birth year], 60 y.o.   MRN: [id number]     Subjective:   Patient ID: [name] female    DOB: [birth year] 60 y.o.    MRN: [id number] Health Maintenance Due: Health Maintenance Due  Topic Date Due  . Hepatitis C Screening  [birth year]  . PNEUMOCOCCAL POLYSACCHARIDE VACCINE (1) 05/30/1956  . URINE MICROALBUMIN  05/30/1964  . PAP SMEAR  05/31/1975  . COLONOSCOPY  05/30/2004    _________________________________________________  HPI: [name] is a 60 y.o. female here for an acute visit for lower extremity edema.  Pt has a PMH outlined below.  Please see problem-based charting assessment and plan for further status of patient's chronic medical problems addressed at today's visit.  PMH: Past Medical History  Diagnosis Date  . Coronary artery disease     S/P PCI of LAD with DES (12/2008). Total occlusion of RCA noted at that time., medically managed. ACS ruled out 03/2009 with Lexiscan myoview . Followed by [facility].  . Pulmonary hypertension ([place])     2-D Echo (87/5643) - Systolic pressure was moderately increased. PA peak pressure  85mHg. 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 Followed by Dr. WJoya Gaskins([facility]  . Diastolic dysfunction     2-D Echo (12/2008) - Normal LV Systolic funciton with EF 60-65%. Grade 1 diastolid dysfunction. No regional wall motion abnormalities. Moderate pulmonary HTN with PA peak pressure 575mg.  . Marland KitchenOPD (chronic obstructive pulmonary disease) (HCC)     Severe. Gold Stage IV.  PFTs (12/2008) - severe obstructive airway disease. Active tobacco use. Requires 4L O2 at home.  . Pulmonary nodule, right     Small right middle lobe nodule. Stable as of 12/2008.  . Marland Kitchenrediabetes     HgbA1c 6.4 (12/2008)  . Hx MRSA infection     Recurrent MRSA thigh abscesses.  . Obesity   . Hyperlipidemia   . GERD (gastroesophageal  reflux disease)     S/P Nissen fundoplication.  . CH[facility] ([facility]estive heart failure) ([facility]  . On home oxygen therapy since 2010    4L all the time  . Shortness of breath dyspnea     with a lot of exertion; if fluid builds up  . Pneumonia   . Depression   . Full dentures   . [place]    "get it most years" (03/03/2015)  . Lung cancer ([facility]    "left"  . History of hiatal hernia   . Arthritis     "some scattered" (03/03/2015)    Medications: Current Outpatient Prescriptions on File Prior to Visit  Medication Sig Dispense Refill  . albuterol (PROVENTIL) (2.5 MG/3ML) 0.083% nebulizer solution Take 3 mLs (2.5 mg total) by nebulization every 6 (six) hours as needed for shortness of breath. 360 mL 3  . albuterol (VENTOLIN HFA) 108 (90 BASE) MCG/ACT inhaler Inhale 2 puffs into the lungs every 6 (six) hours as needed for shortness of breath. 18 g 11  . aspirin 81 MG chewable tablet Chew 1 tablet (81 mg total) by mouth at bedtime. 90 tablet 3  . Aspirin-Salicylamide-Caffeine (BC HEADACHE POWDER PO) Take 1 packet by mouth 3 (three) times daily as needed (pain).    . Fluticasone-Salmeterol (ADVAIR DISKUS) 250-50 MCG/DOSE AEPB Inhale 1 puff into the lungs 2 (two) times daily. 60 each 11  . furosemide (LASIX) 40 MG tablet  Take 1-2 tablets (40-80 mg total) by mouth as directed. Take 80 mg (2 tabs) in the AM and 40 mg (1 tab) in the PM 90 tablet 3  . gabapentin (NEURONTIN) 300 MG capsule Take 300 mg by mouth at bedtime.    Marland Kitchen guaiFENesin-codeine 100-10 MG/5ML syrup Take 5 mLs by mouth every 6 (six) hours as needed for cough. 120 mL 0  . lovastatin (MEVACOR) 40 MG tablet Take 1 tablet (40 mg total) by mouth at bedtime. 30 tablet 11  . metolazone (ZAROXOLYN) 2.5 MG tablet TAKE 1 TABLET BY MOUTH AS NEEDED(FOR 5 LBS WEIGHT GAIN) 90 tablet 3  . OXYGEN Inhale 4.5 L into the lungs continuous.     . pantoprazole (PROTONIX) 40 MG tablet TAKE 1 TABLET BY MOUTH DAILY 30 tablet 3  . potassium  chloride SA (K-DUR,KLOR-CON) 20 MEQ tablet Take 1-2 tablets (20-40 mEq total) by mouth as directed. Take 40 meq in the am and 20 meq in the PM May take add'l 20 meq prn with Metolazone 105 tablet 3  . tiotropium (SPIRIVA HANDIHALER) 18 MCG inhalation capsule Place 1 capsule (18 mcg total) into inhaler and inhale daily. 30 capsule 12   No current facility-administered medications on file prior to visit.    Allergies: Allergies  Allergen Reactions  . Fluconazole Anaphylaxis, Itching and Swelling  . Atorvastatin Other (See Comments)    Dizziness after med started and resolved per pt after med stopped. September 08 2014    FH: Family History  Problem Relation Age of Onset  . Heart disease Mother 84    Deceased from MI at 58yo  . Hypertension Mother   . Heart disease Father 83    Deceased of MI age 18yo  . Hypertension Father   . Hypertension Brother   . Lung cancer      Grandmother    SH: Social History   Social History  . Marital Status: Divorced    Spouse Name: N/A  . Number of Children: N/A  . Years of Education: N/A   Social History Main Topics  . Smoking status: Current Every Day Smoker -- 0.50 packs/day for 45 years    Types: Cigarettes    Start date: 04/04/1969  . Smokeless tobacco: Never Used     Comment: 1 cig per day  . Alcohol Use: No  . Drug Use: No  . Sexual Activity: Not Currently    Birth Control/ Protection: None   Other Topics Concern  . None   Social History Narrative   Formerly worked as a Scientist, water quality, now disabled.   Divorced.   2 grown children.   Lives with her grandson.    Review of Systems: Constitutional: Negative for fever, chills and weight loss.  Eyes: Negative for blurred vision.  Respiratory: Negative for cough and +shortness of breath.  Cardiovascular: Negative for chest pain, palpitations and +leg swelling.    Objective:   Vital Signs: Filed Vitals:   07/07/15 1428  BP: 116/52  Pulse: 97  Temp: 97.9 F (36.6 C)  TempSrc: Oral    Weight: 246 lb 6.4 oz (111.766 kg)  SpO2: 94%      BP Readings from Last 3 Encounters:  07/07/15 116/52  05/28/15 92/35  05/27/15 109/60    Physical Exam: Constitutional: Vital signs reviewed.  Patient is in NAD and cooperative with exam.  Head: Normocephalic and atraumatic. Eyes: EOMI, conjunctivae nl, no scleral icterus.  Neck: Supple. Cardiovascular: RRR, no MRG. Pulmonary/Chest: normal effort, CTAB, no wheezes, rales,  or rhonchi. On 4L O2 [place].  Abdominal: Soft. Obese. NT/ND +BS. Neurological: A&O x3, cranial nerves II-XII are grossly intact, moving all extremities. Extremities: Chronic venous stasis changes.  No open wounds.  Skin: Warm, dry and intact.    Assessment & Plan:   Assessment and plan was discussed and formulated with my attending.

## 2015-07-07 NOTE — Patient Instructions (Signed)
Thank you for your visit today.   Please return to the internal medicine clinic in June to see Dr. Eppie Gibson. Please continue to use the cream on your legs.   You may also go to [facility] and they will measure you for properly fitted compression socks.  In the meantime, continue to wear the ones that you have. Also, you need to follow up with Dr. Tempie Hoist.  Please do not miss appointments with him. Please make sure to weight yourself everyday.  Take your metolazone as instructed.  We will also refer you to podiatry for nail trimming.     Please be sure to bring all of your medications with you to every visit; this includes herbal supplements, vitamins, eye drops, and any over-the-counter medications.   Should you have any questions regarding your medications and/or any new or worsening symptoms, please be sure to call the clinic at [phone].   If you believe that you are suffering from a life threatening condition or one that may result in the loss of limb or function, then you should call 911 and proceed to the nearest Emergency Department.  Heart Failure Heart failure means your heart has trouble pumping blood. This makes it hard for your body to work well. Heart failure is usually a long-term (chronic) condition. You must take good care of yourself and follow your doctor's treatment plan. HOME CARE  Take your heart medicine as told by your doctor.  Do not stop taking medicine unless your doctor tells you to.  Do not skip any dose of medicine.  Refill your medicines before they run out.  Take other medicines only as told by your doctor or pharmacist.  Stay active if told by your doctor. The elderly and people with severe heart failure should talk with a doctor about physical activity.  Eat heart-healthy foods. Choose foods that are without trans fat and are low in saturated fat, cholesterol, and salt (sodium). This includes fresh or frozen fruits and vegetables, fish, lean  meats, fat-free or low-fat dairy foods, whole grains, and high-fiber foods. Lentils and dried peas and beans (legumes) are also good choices.  Limit salt if told by your doctor.  Cook in a healthy way. Roast, grill, broil, bake, poach, steam, or stir-fry foods.  Limit fluids as told by your doctor.  Weigh yourself every morning. Do this after you pee (urinate) and before you eat breakfast. Write down your weight to give to your doctor.  Take your blood pressure and write it down if your doctor tells you to.  Ask your doctor how to check your pulse. Check your pulse as told.  Lose weight if told by your doctor.  Stop smoking or chewing tobacco. Do not use gum or patches that help you quit without your doctor's approval.  Schedule and go to doctor visits as told.  Nonpregnant women should have no more than 1 drink a day. Men should have no more than 2 drinks a day. Talk to your doctor about drinking alcohol.  Stop illegal drug use.  Stay current with shots (immunizations).  Manage your health conditions as told by your doctor.  Learn to manage your stress.  Rest when you are tired.  If it is really hot outside:  Avoid intense activities.  Use air conditioning or fans, or get in a cooler place.  Avoid caffeine and alcohol.  Wear loose-fitting, lightweight, and light-colored clothing.  If it is really cold outside:  Avoid intense activities.  Layer  your clothing.  Wear mittens or gloves, a hat, and a scarf when going outside.  Avoid alcohol.  Learn about heart failure and get support as needed.  Get help to maintain or improve your quality of life and your ability to care for yourself as needed. GET HELP IF:   You gain weight quickly.  You are more short of breath than usual.  You cannot do your normal activities.  You tire easily.  You cough more than normal, especially with activity.  You have any or more puffiness (swelling) in areas such as your  hands, feet, ankles, or belly (abdomen).  You cannot sleep because it is hard to breathe.  You feel like your heart is beating fast (palpitations).  You get dizzy or light-headed when you stand up. GET HELP RIGHT AWAY IF:   You have trouble breathing.  There is a change in mental status, such as becoming less alert or not being able to focus.  You have chest pain or discomfort.  You faint. MAKE SURE YOU:   Understand these instructions.  Will watch your condition.  Will get help right away if you are not doing well or get worse.   This information is not intended to replace advice given to you by your health care provider. Make sure you discuss any questions you have with your health care provider.   Document Released: 12/29/2007 Document Revised: 04/11/2014 Document Reviewed: 05/07/2012 Elsevier Interactive Patient Education [employer].

## 2015-07-08 NOTE — Progress Notes (Signed)
Internal Medicine Clinic Attending  Case discussed with Dr. Gordy Levan at the time of the visit.  We reviewed the resident's history and exam and pertinent patient test results.  I agree with the assessment, diagnosis, and plan of care documented in the resident's note.

## 2015-07-15 ENCOUNTER — Encounter: Payer: Self-pay | Admitting: *Deleted

## 2015-07-16 ENCOUNTER — Telehealth (HOSPITAL_COMMUNITY): Payer: Self-pay | Admitting: *Deleted

## 2015-07-16 NOTE — Telephone Encounter (Signed)
Katie called and stated that pts weight is up 7lbs in 4 days. Pt admitted to having high sodium foods and increased fluid intake.  Pt has metolazone as needed for 5lb weight gain. Pt had not taken her metolazone. I advised katie to have patient take her metolazone and check her weight if her weight does not go down to call the on call number.

## 2015-07-20 ENCOUNTER — Other Ambulatory Visit: Payer: Self-pay | Admitting: Internal Medicine

## 2015-07-20 ENCOUNTER — Other Ambulatory Visit: Payer: Self-pay

## 2015-07-20 DIAGNOSIS — Z1231 Encounter for screening mammogram for malignant neoplasm of breast: Secondary | ICD-10-CM

## 2015-08-04 ENCOUNTER — Telehealth: Payer: Self-pay | Admitting: Licensed Clinical Social Worker

## 2015-08-04 NOTE — Telephone Encounter (Signed)
CSW referred by paramedicine to assist with meals on wheels referral. Patient lives at home and struggling with meal prep due to health issues. Paramedic stated she sometimes eats popcorn for dinner due to inability to prepare meals. CSW contacted [employer] [facility] [place] and made referral for [employer]. Patient will receive call within the next week from a social worker to complete home assessment for MOW's. CSW relayed information to paramedic who will follow up with patient on visit. CSW continues to be available as needed. Raquel Sarna, LCSW [phone]

## 2015-08-05 ENCOUNTER — Ambulatory Visit: Payer: Commercial Managed Care - HMO

## 2015-08-16 IMAGING — CR DG CHEST 2V
2 series · 2 of 2 positions shown · non-contrast
Comparison: 11/06/2012.

CLINICAL DATA: Productive cough, upper back pain, sore throat and
fever.

EXAM:
CHEST  2 VIEW

[w chest pa]
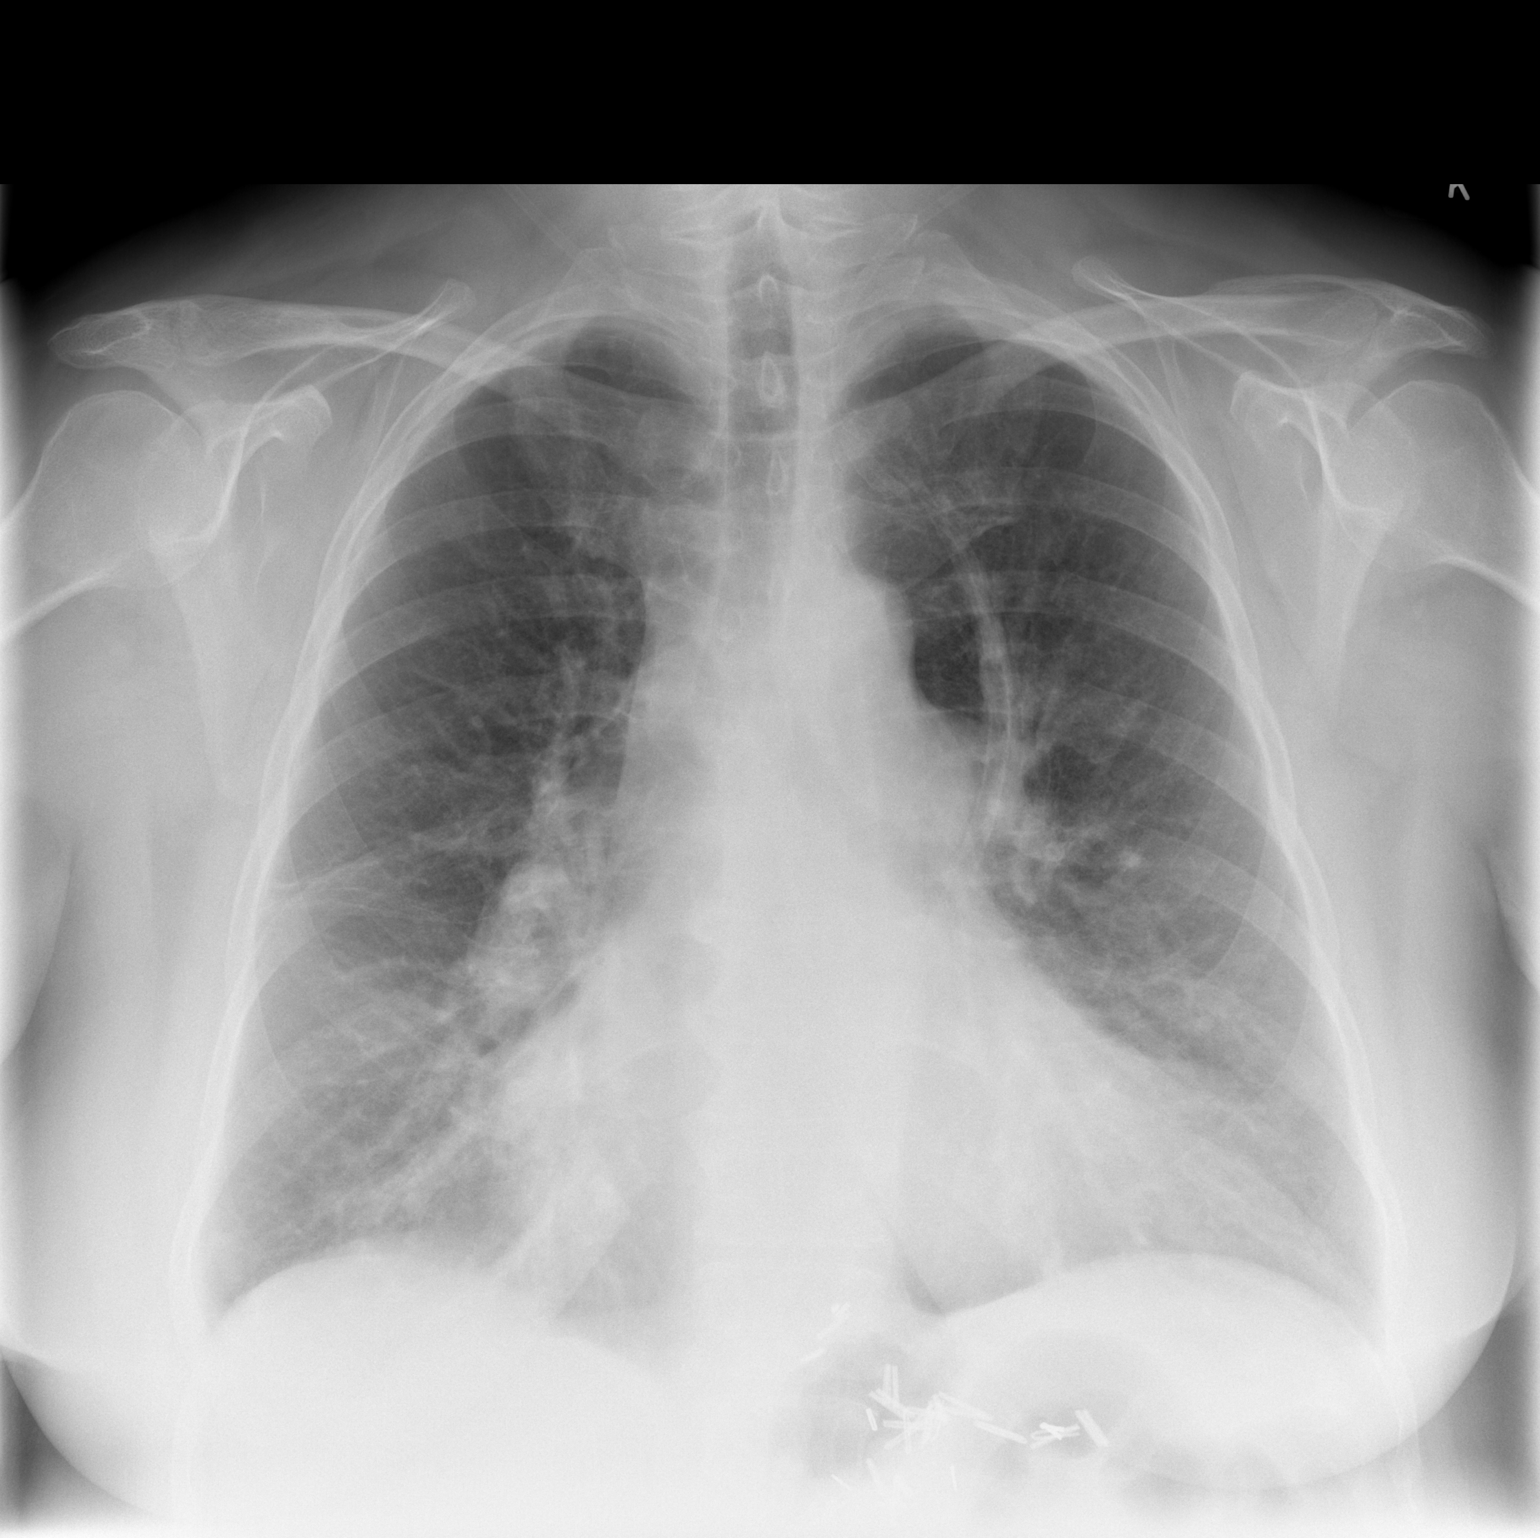

[w chest lat]
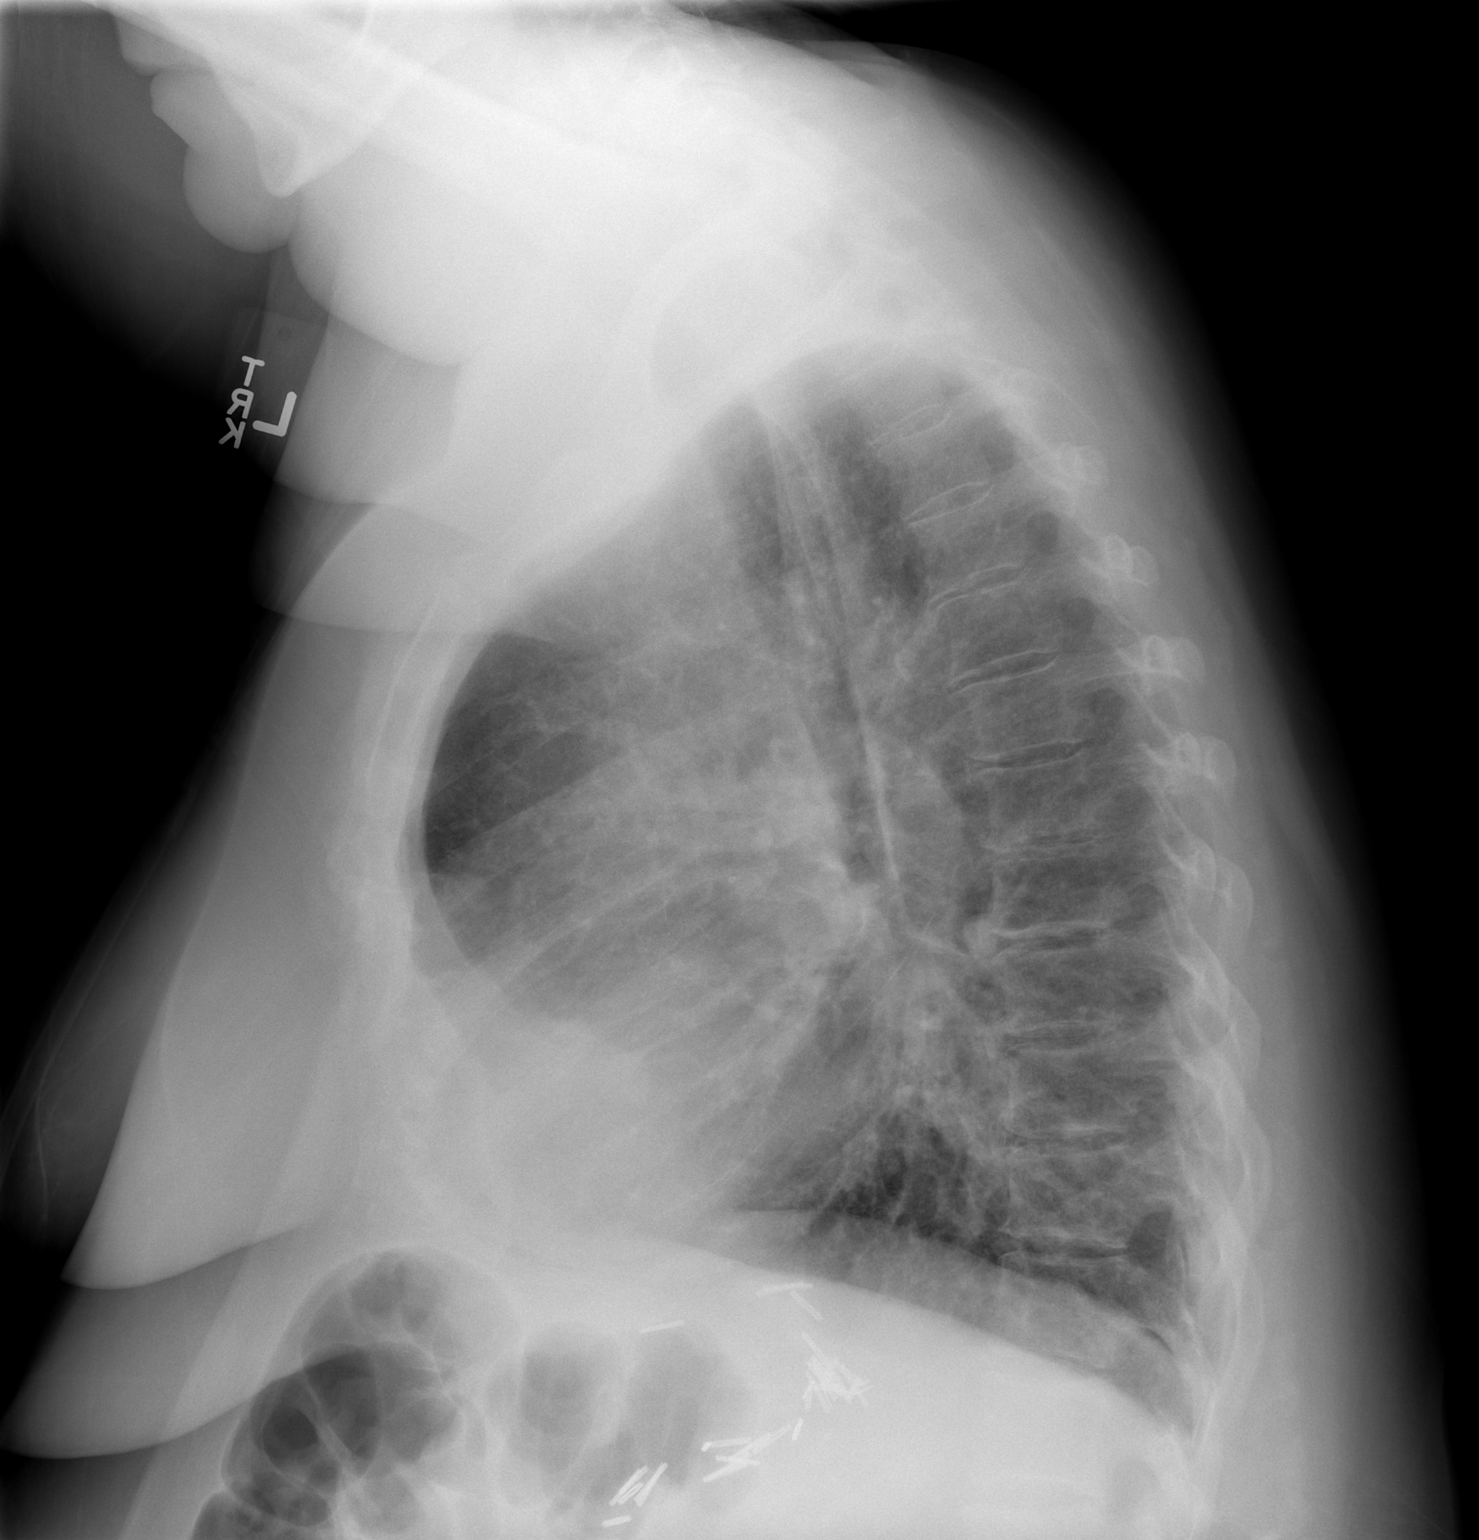

[2 of 2 positions shown; findings below may reference images not displayed]

FINDINGS: Trachea is midline. Heart is enlarged, stable. Hilar regions appear
mildly prominent, as before. Mild perihilar and bibasilar airspace
disease. No definite pleural fluid. No definite pleural fluid.
IMPRESSION: Mild perihilar and bibasilar edema versus scarring.

## 2015-08-20 ENCOUNTER — Telehealth (HOSPITAL_COMMUNITY): Payer: Self-pay | Admitting: *Deleted

## 2015-08-20 NOTE — Telephone Encounter (Signed)
Katie called to report pt has been having dizziness over the weekend, it is a little better now.  BP was 112/60 sitting and 120/70 standing.  She states pt wt is stable at 235, pt does have edema in feet.  She is requesting an appt.  appt sch for Wed 5/24 at 11:40 w/Amy Ninfa Meeker, NP

## 2015-08-26 ENCOUNTER — Encounter (HOSPITAL_COMMUNITY): Payer: Commercial Managed Care - HMO

## 2015-09-09 ENCOUNTER — Other Ambulatory Visit (HOSPITAL_COMMUNITY): Payer: Self-pay | Admitting: *Deleted

## 2015-09-09 ENCOUNTER — Other Ambulatory Visit (HOSPITAL_COMMUNITY): Payer: Self-pay | Admitting: Internal Medicine

## 2015-09-09 DIAGNOSIS — I509 Heart failure, unspecified: Secondary | ICD-10-CM

## 2015-09-09 DIAGNOSIS — I5032 Chronic diastolic (congestive) heart failure: Secondary | ICD-10-CM

## 2015-09-09 MED ORDER — POTASSIUM CHLORIDE CRYS ER 20 MEQ PO TBCR: EXTENDED_RELEASE_TABLET | ORAL | Status: DC

## 2015-09-09 MED ORDER — FUROSEMIDE 40 MG PO TABS: ORAL_TABLET | ORAL | Status: DC

## 2015-09-10 ENCOUNTER — Ambulatory Visit (INDEPENDENT_AMBULATORY_CARE_PROVIDER_SITE_OTHER): Payer: Commercial Managed Care - HMO | Admitting: Internal Medicine

## 2015-09-10 ENCOUNTER — Encounter: Payer: Self-pay | Admitting: Internal Medicine

## 2015-09-10 VITALS — BP 100/65 | HR 82 | Temp 98.2°F | Wt 235.7 lb

## 2015-09-10 DIAGNOSIS — Z23 Encounter for immunization: Secondary | ICD-10-CM

## 2015-09-10 DIAGNOSIS — I872 Venous insufficiency (chronic) (peripheral): Secondary | ICD-10-CM

## 2015-09-10 DIAGNOSIS — Z79899 Other long term (current) drug therapy: Secondary | ICD-10-CM

## 2015-09-10 DIAGNOSIS — E538 Deficiency of other specified B group vitamins: Secondary | ICD-10-CM

## 2015-09-10 DIAGNOSIS — F17200 Nicotine dependence, unspecified, uncomplicated: Secondary | ICD-10-CM | POA: Diagnosis not present

## 2015-09-10 DIAGNOSIS — I5032 Chronic diastolic (congestive) heart failure: Secondary | ICD-10-CM | POA: Diagnosis not present

## 2015-09-10 DIAGNOSIS — Z720 Tobacco use: Secondary | ICD-10-CM

## 2015-09-10 DIAGNOSIS — I700 Atherosclerosis of aorta: Secondary | ICD-10-CM

## 2015-09-10 DIAGNOSIS — I2510 Atherosclerotic heart disease of native coronary artery without angina pectoris: Secondary | ICD-10-CM | POA: Diagnosis not present

## 2015-09-10 DIAGNOSIS — E1159 Type 2 diabetes mellitus with other circulatory complications: Secondary | ICD-10-CM

## 2015-09-10 DIAGNOSIS — H9319 Tinnitus, unspecified ear: Secondary | ICD-10-CM | POA: Insufficient documentation

## 2015-09-10 DIAGNOSIS — E1151 Type 2 diabetes mellitus with diabetic peripheral angiopathy without gangrene: Secondary | ICD-10-CM

## 2015-09-10 DIAGNOSIS — J449 Chronic obstructive pulmonary disease, unspecified: Secondary | ICD-10-CM

## 2015-09-10 DIAGNOSIS — H8110 Benign paroxysmal vertigo, unspecified ear: Secondary | ICD-10-CM

## 2015-09-10 DIAGNOSIS — I272 Other secondary pulmonary hypertension: Secondary | ICD-10-CM

## 2015-09-10 DIAGNOSIS — G63 Polyneuropathy in diseases classified elsewhere: Secondary | ICD-10-CM

## 2015-09-10 DIAGNOSIS — C3492 Malignant neoplasm of unspecified part of left bronchus or lung: Secondary | ICD-10-CM

## 2015-09-10 DIAGNOSIS — Z9981 Dependence on supplemental oxygen: Secondary | ICD-10-CM

## 2015-09-10 DIAGNOSIS — E785 Hyperlipidemia, unspecified: Secondary | ICD-10-CM

## 2015-09-10 DIAGNOSIS — Z7982 Long term (current) use of aspirin: Secondary | ICD-10-CM

## 2015-09-10 DIAGNOSIS — Z0000 Encounter for general adult medical examination without abnormal findings: Secondary | ICD-10-CM

## 2015-09-10 LAB — POCT GLYCOSYLATED HEMOGLOBIN (HGB A1C): HEMOGLOBIN A1C: 7.3

## 2015-09-10 LAB — GLUCOSE, CAPILLARY: Glucose-Capillary: 156 mg/dL — ABNORMAL HIGH (ref 65–99)

## 2015-09-10 MED ORDER — CYANOCOBALAMIN 1000 MCG/ML IJ SOLN
1000.0000 ug | Freq: Once | INTRAMUSCULAR | Status: AC
  Administered 2015-09-10: 1000 ug via INTRAMUSCULAR

## 2015-09-10 MED ORDER — LISINOPRIL 5 MG PO TABS: 5.0000 mg | ORAL_TABLET | Freq: Every day | ORAL | Status: DC

## 2015-09-10 MED ORDER — POTASSIUM CHLORIDE CRYS ER 20 MEQ PO TBCR: 20.0000 meq | EXTENDED_RELEASE_TABLET | Freq: Two times a day (BID) | ORAL | Status: DC

## 2015-09-10 MED ORDER — VITAMIN B-12 1000 MCG PO TABS
1000.0000 ug | ORAL_TABLET | Freq: Every day | ORAL | Status: DC
Start: 2015-09-10 — End: 2016-11-14

## 2015-09-10 MED ORDER — METFORMIN HCL 500 MG PO TABS: 500.0000 mg | ORAL_TABLET | Freq: Two times a day (BID) | ORAL | Status: DC

## 2015-09-10 MED ORDER — ROSUVASTATIN CALCIUM 20 MG PO TABS: 20.0000 mg | ORAL_TABLET | Freq: Every day | ORAL | Status: DC

## 2015-09-10 NOTE — Assessment & Plan Note (Signed)
Assessment  Ms. Serrao has been compliant with her aspirin and lovastatin. She denies any chest pain.  Plan  We will continue the aspirin at 81 mg by mouth daily. Given the new diagnosis of diabetes we will also start a very low dose of an ACE inhibitor, specifically lisinopril 5 mg by mouth daily. Her low intensity statin was converted to a high intensity statin, specifically rosuvastatin 20 mg by mouth daily. We will reassess for evidence of angina at the follow-up visit on the above regimen.

## 2015-09-10 NOTE — Assessment & Plan Note (Signed)
Assessment  Her severe oxygen requiring chronic obstructive pulmonary disease is symptomatically stable on as needed albuterol, Advair Diskus 1 puff twice daily, and tiotropium 1 puff daily. She has been compliant with her home oxygen at 4 L/m.  Plan  We will continue her current inhaler regimen and supplemental home oxygen therapy. We discussed the importance of smoking cessation. Please see the section below for specifics. We will reassess her pulmonary symptoms at the follow-up visit

## 2015-09-10 NOTE — Assessment & Plan Note (Signed)
Assessment  Her pulmonary hypertension was felt to be secondary to her severe chronic obstructive pulmonary disease with associated hypoxemia.  Plan  We will continue the management of her chronic obstructive pulmonary disease as outlined above including continuing the oxygen therapy 4 L/m. We will reassess for any evidence of decompensated right heart failure at the follow-up visit.

## 2015-09-10 NOTE — Assessment & Plan Note (Signed)
Assessment  [name] was diagnosed with diabetes today given her hemoglobin A1c of 7.3, random blood sugars over 200, and associated polyuria and polydipsia. She previously had an A1c of 7.7 while hospitalized after being on prednisone for significant time. This time she has not been on prednisone recently and the fact that her A1c is over 6.5 consistently with random sugars over 200 that are symptomatic, a diagnosis of diabetes can be confidently made. Fortunately, at this point the only associated process is atherosclerosis of the aorta. This is currently asymptomatic.  Plan  We discussed diet and exercise as well as medications to treat her diabetes. [name] was honest and stated that she did not think diet was something that she could be successful with even with dietary education. She preferred the initiation of medication. We therefore started metformin 500 mg by mouth daily for one week to be increased to 500 mg twice daily thereafter. We will reassess her diabetic control with a repeat hemoglobin A1c at the follow-up visit on the 500 mg twice a day dose of metformin. At the follow-up visit we will also initiate other diabetic preventative care including a formal foot exam and eye exam. She was started on low-dose lisinopril 5 mg by mouth daily as a nephroprotectant. We will continue to aggressively treat her atherosclerotic risk factors, namely her hyperlipidemia, and hopefully eventually her tobacco abuse.

## 2015-09-10 NOTE — Assessment & Plan Note (Signed)
We discussed colon cancer screening with colonoscopy or stool cards. She is a very poor candidate for colonoscopy given her underlying cardiovascular and pulmonary disease. Sedation for colonoscopy is rather high risk. She is not interested in stool cards at this time as she has 1 or 2 bowel movements a week. We will reassess her interest in stool cards as a mode for colon cancer screening at the follow-up visit. She did received the Prevnar 13 Pneumovax today and next year will receive the Pneumovax 23 vaccination. We will discuss a Pap smear as well as draw a hepatitis C screen at the follow-up visit.

## 2015-09-10 NOTE — Assessment & Plan Note (Signed)
Assessment  It appears the oral vitamin B12 that was previously prescribed has fallen off of her medication list. She is now complaining of burning and tingling in her feet that I believe is related to the vitamin B12 deficiency. Her diabetes is not long-standing enough, nor severely out-of-control enough, to simply be a diabetic neuropathy.  Plan  She was started back on oral vitamin B 12 at 1000 mcg by mouth daily. She was also given a 1000 mcg intramuscular dose of vitamin B12. At each clinic visit we will provide her with an intramuscular dose of vitamin B12. At the follow-up visit I will reassess the vitamin B12 level on the oral therapy. Our goal is to prevent any further damage to her nerves peripherally related to the vitamin B12 deficiency.

## 2015-09-10 NOTE — Assessment & Plan Note (Signed)
Assessment  Her chronic venous insufficiency is clinically improved with aggressive therapy for her chronic diastolic heart failure.  Plan  We will continue aggressive therapy for her chronic diastolic heart failure as well as continue oxygen therapy for her chronic obstructive pulmonary disease with associated pulmonary hypertension all in an attempt to decrease the lower extremity edema that only further complicates her chronic venous insufficiency. We will reassess the success of this therapy via clinical examination at the return appointment.

## 2015-09-10 NOTE — Assessment & Plan Note (Signed)
Assessment  [name] is tolerating a low intensity statin well without myalgias. That being said, she was just diagnosed with diabetes and has known coronary artery disease with atherosclerotic changes. She is at high risk for a cardiovascular event within the next 10 years. She therefore requires therapy with a high intensity statin.  Plan  We will discontinue the lovastatin and start rosuvastatin 20 mg by mouth daily. We will reassess her tolerance of this medication at the follow-up visit.

## 2015-09-10 NOTE — Assessment & Plan Note (Signed)
Assessment  The patient's chronic diastolic heart failure has symptomatically been well controlled with furosemide 80 mg by mouth in the morning and 40 mg by mouth in the evening. She states she very rarely has required the metolazone as her weight has been within a rather tight range over the last several months.  Plan  We will continue the furosemide at 80 mg by mouth in the morning and 40 mg by mouth for the afternoon. She will continue to weigh herself and utilize the metolazone dose were her weight to increase by 5 pounds. We have changed the potassium chloride to 20 mEq twice daily given that we are also starting a low-dose ACE inhibitor as a nephroprotectant for her newly diagnosed diabetes. We will check a basic metabolic panel at the follow-up visit to assure the potassium and renal function are stable.

## 2015-09-10 NOTE — Patient Instructions (Signed)
It was great to see you again!  It has been a very long time!  I am glad your living situation is a little better now.  1) We gave you a pneumonia-13 shot and a vitamin B12 shot today.  2) We stopped your lovastatin and replaced it with a more powerful rosuvastatin 20 mg by mouth daily to better protect your heart.  3) We started lisinopril 5 mg daily for your heart failure.  4) We started vitamin B12 (cyanocolbalamin) 1000 mcg daily to get your levels up and protect the nerves in your feet.  5) Keep taking the other medications as you are.  6) Call if your vertigo gets bad enough and I will send a referral to physical therapy to address the issue as you have had done before.  7) Start metformin for the diabetes.  Take 500 mg every morning for 1 week and then 500 mg twice a day thereafter.  I will see you back in 3 months, sooner if necessary.

## 2015-09-10 NOTE — Progress Notes (Signed)
   Subjective:    Patient ID: [name], female    DOB: [birth year], 60 y.o.   MRN: [id number]  HPI  [name] is here for follow-up of her chronic diastolic heart failure, oxygen requiring chronic obstructive pulmonary disease, coronary artery disease, tobacco abuse, and hyperglycemia. Please see the A&P for the status of the pt's chronic medical problems.  Review of Systems  Constitutional: Negative for activity change, appetite change and unexpected weight change.  HENT: Positive for tinnitus.        Subjective tinnitus  Respiratory: Positive for shortness of breath. Negative for chest tightness and wheezing.   Cardiovascular: Positive for leg swelling. Negative for chest pain and palpitations.  Gastrointestinal: Positive for constipation. Negative for nausea, vomiting, abdominal pain and diarrhea.  Endocrine: Positive for polydipsia and polyuria.  Genitourinary: Positive for frequency.  Skin: Positive for color change.       Lower extremity erythema is improved  Neurological: Positive for dizziness and numbness.       Vertigo with lying down and moving in a lying position Tingling, numbness, and pain in bilateral feet  Psychiatric/Behavioral: Negative for dysphoric mood.      Objective:   Physical Exam  Constitutional: She is oriented to person, place, and time. She appears well-developed and well-nourished. No distress.  HENT:  Head: Normocephalic and atraumatic.  Eyes: Conjunctivae are normal. Right eye exhibits no discharge. Left eye exhibits no discharge. No scleral icterus.  Cardiovascular: Normal rate, regular rhythm and normal heart sounds.  Exam reveals no gallop and no friction rub.   No murmur heard. Pulmonary/Chest: Effort normal and breath sounds normal. No respiratory distress. She has no wheezes. She has no rales.  Abdominal: Soft. Bowel sounds are normal. She exhibits no distension. There is no tenderness. There is no rebound and no guarding.    Musculoskeletal: Normal range of motion. She exhibits edema. She exhibits no tenderness.  Lower extremity edema improved from previous visit  Neurological: She is alert and oriented to person, place, and time. She exhibits normal muscle tone.  Skin: Skin is warm and dry. No rash noted. She is not diaphoretic. There is erythema.  Bilateral lower extremity erythema improved.  No associated warmth or skin breakdown.  Psychiatric: She has a normal mood and affect. Her behavior is normal. Judgment and thought content normal.  Nursing note and vitals reviewed.     Assessment & Plan:   Please see problem oriented charting.

## 2015-09-10 NOTE — Assessment & Plan Note (Signed)
Assessment  She has a history of benign paroxysmal positional vertigo which has responded to vestibular rehabilitation in the past. This has recently recurred with symptoms of vertigo occurring mainly with lying down and turning over in bed.   Plan  I offered her referral back for vestibular rehabilitation but she is not interested at this time as it is not that symptomatic. I asked her to call the clinic to let us know if it worsens and I can place a referral at that time rather than have her make a special appointment.

## 2015-09-10 NOTE — Assessment & Plan Note (Signed)
Assessment  [name] remains in the pre-contemplative stages of smoking cessation. She has tried nicotine replacement previously without success. She has also tried Wellbutrin in the past without success. She is not interested in Chantix given her concerns over the side effects she's been told of including depression and suicidality. We spoke for more than 3 minutes about the risks of continued tobacco abuse given her underlying diabetes, coronary artery disease, and severe chronic obstructive pulmonary disease complicated by pulmonary artery hypertension. She is fully aware of these risks.  Plan  [name] agreed to discuss the issue of smoking cessation at the follow-up visit and will reconsider Wellbutrin therapy at that time.

## 2015-09-10 NOTE — Assessment & Plan Note (Signed)
Assessment  She underwent curative intent radiation therapy to her lung adenocarcinoma. A recent restaging evaluation with a CT scan done by Dr. Earlie Server at the [facility] revealed no progression radiographically.  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 Earlie Server at the [facility].

## 2015-09-11 ENCOUNTER — Encounter: Payer: Commercial Managed Care - HMO | Admitting: Internal Medicine

## 2015-09-22 ENCOUNTER — Encounter: Payer: Self-pay | Admitting: Licensed Clinical Social Worker

## 2015-09-22 ENCOUNTER — Encounter (HOSPITAL_COMMUNITY): Payer: Self-pay | Admitting: Internal Medicine

## 2015-09-22 ENCOUNTER — Ambulatory Visit (HOSPITAL_COMMUNITY)
Admission: RE | Admit: 2015-09-22 | Discharge: 2015-09-22 | Disposition: A | Discharge status: Home / Self Care | Payer: Commercial Managed Care - HMO | Source: Ambulatory Visit | Attending: Internal Medicine | Admitting: Internal Medicine

## 2015-09-22 VITALS — BP 94/62 | HR 74 | Wt 235.5 lb

## 2015-09-22 DIAGNOSIS — G4730 Sleep apnea, unspecified: Secondary | ICD-10-CM

## 2015-09-22 DIAGNOSIS — E785 Hyperlipidemia, unspecified: Secondary | ICD-10-CM | POA: Diagnosis not present

## 2015-09-22 DIAGNOSIS — G4733 Obstructive sleep apnea (adult) (pediatric): Secondary | ICD-10-CM | POA: Diagnosis not present

## 2015-09-22 DIAGNOSIS — I2510 Atherosclerotic heart disease of native coronary artery without angina pectoris: Secondary | ICD-10-CM | POA: Insufficient documentation

## 2015-09-22 DIAGNOSIS — I272 Other secondary pulmonary hypertension: Secondary | ICD-10-CM | POA: Insufficient documentation

## 2015-09-22 DIAGNOSIS — I5032 Chronic diastolic (congestive) heart failure: Secondary | ICD-10-CM | POA: Insufficient documentation

## 2015-09-22 DIAGNOSIS — J449 Chronic obstructive pulmonary disease, unspecified: Secondary | ICD-10-CM | POA: Insufficient documentation

## 2015-09-22 DIAGNOSIS — R6889 Other general symptoms and signs: Secondary | ICD-10-CM | POA: Diagnosis not present

## 2015-09-22 DIAGNOSIS — Z6839 Body mass index (BMI) 39.0-39.9, adult: Secondary | ICD-10-CM | POA: Insufficient documentation

## 2015-09-22 DIAGNOSIS — Z9981 Dependence on supplemental oxygen: Secondary | ICD-10-CM | POA: Diagnosis not present

## 2015-09-22 DIAGNOSIS — Z955 Presence of coronary angioplasty implant and graft: Secondary | ICD-10-CM | POA: Insufficient documentation

## 2015-09-22 DIAGNOSIS — C3492 Malignant neoplasm of unspecified part of left bronchus or lung: Secondary | ICD-10-CM | POA: Diagnosis not present

## 2015-09-22 DIAGNOSIS — Z888 Allergy status to other drugs, medicaments and biological substances status: Secondary | ICD-10-CM | POA: Diagnosis not present

## 2015-09-22 DIAGNOSIS — Z7984 Long term (current) use of oral hypoglycemic drugs: Secondary | ICD-10-CM | POA: Insufficient documentation

## 2015-09-22 DIAGNOSIS — Z7982 Long term (current) use of aspirin: Secondary | ICD-10-CM | POA: Diagnosis not present

## 2015-09-22 DIAGNOSIS — E119 Type 2 diabetes mellitus without complications: Secondary | ICD-10-CM | POA: Insufficient documentation

## 2015-09-22 DIAGNOSIS — F17210 Nicotine dependence, cigarettes, uncomplicated: Secondary | ICD-10-CM | POA: Insufficient documentation

## 2015-09-22 DIAGNOSIS — Z79899 Other long term (current) drug therapy: Secondary | ICD-10-CM | POA: Diagnosis not present

## 2015-09-22 DIAGNOSIS — E669 Obesity, unspecified: Secondary | ICD-10-CM | POA: Insufficient documentation

## 2015-09-22 DIAGNOSIS — Z8249 Family history of ischemic heart disease and other diseases of the circulatory system: Secondary | ICD-10-CM | POA: Diagnosis not present

## 2015-09-22 DIAGNOSIS — Z923 Personal history of irradiation: Secondary | ICD-10-CM | POA: Diagnosis not present

## 2015-09-22 LAB — BASIC METABOLIC PANEL
ANION GAP: 7 (ref 5–15)
BUN: 13 mg/dL (ref 6–20)
CALCIUM: 9 mg/dL (ref 8.9–10.3)
CO2: 37 mmol/L — ABNORMAL HIGH (ref 22–32)
CREATININE: 1.24 mg/dL — AB (ref 0.44–1.00)
Chloride: 87 mmol/L — ABNORMAL LOW (ref 101–111)
GFR, EST AFRICAN AMERICAN: 53 mL/min — AB (ref >60)
GFR, EST NON AFRICAN AMERICAN: 46 mL/min — AB (ref >60)
GLUCOSE: 122 mg/dL — AB (ref 65–99)
Potassium: 3.8 mmol/L (ref 3.5–5.1)
SODIUM: 131 mmol/L — AB (ref 135–145)

## 2015-09-22 LAB — BRAIN NATRIURETIC PEPTIDE: B Natriuretic Peptide: 51.2 pg/mL (ref 0.0–100.0)

## 2015-09-22 NOTE — Patient Instructions (Signed)
Routine lab work today. Will notify you of abnormal results  Follow up with Dr.Bensimhon in 3 months.

## 2015-09-22 NOTE — Progress Notes (Signed)
Paramedicine Multidisciplinary Team Update  [name] is enrolled in the [employer] Program through [facility].  The patient presents today in association with an [facility] Appointment.  Patient living/home environment and social support-- Patient reports she lives at home with her 61yo grandson [name] Patient denies any other support persons.  Insurance/ Prescription Coverage-- Patient reports she has the extra help program for prescription assistance.  Does the patient have a scale and weigh each day? Yes and shares weekly with Joellen Jersey -paramedicine  Does the patient follow a [name] salt diet? "try to"   Is patient compliant with medications? Yes  Does the patient have transportation for physician appointments? [employer]  Does the patient contact the HF Clinic appropriately with worsening symptoms or weight increases? yes  Do you have an Advanced Directive? CSW provided patient with a blank copy and will review and return to clinic for further assistance.  Are there any identified obstacles / challenges for adherence to current treatment plan? Patient reports "smoking". CSW provided smoking cessation for patient to review and consider.   Patient verbalizes understanding of support services through HF clinic and will contact CSW if further needs arise. CSW will continue to provide support and coordinate care through paramedicine program. Raquel Sarna, LCSW [phone]

## 2015-09-22 NOTE — Progress Notes (Signed)
Patient ID: [name], female   DOB: 1955-04-12, 60 y.o.   MRN: [id number] Patient ID: [name], female   DOB: Feb 10, 1956, 60 y.o.   MRN: [id number]    Advanced Heart Failure Clinic Note   Referring Physician: Eppie [name] Primary Care: [name] Primary Cardiologist: Previously [name]  HPI: [name] is a 60 y.o. female with history of CAD s/p PCI 2010, pulmonary HTN, COPD on 4L via [place] chronically, Chronic diastolic CHF ECHO 07/1285 EF 50-55%, grade 1 DD, obesity, and primary adenocarcinoma of left lung who presents to establish in the HF clinic.   Has had 6 hospital admits in last 6 months for HF and related complaints.   Last amitted 05/04/15 - 05/06/15 for peripheral edema though BNP and CXR not suggestive of acute exacerbation. 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 Discharged with [facility] boots and HH.    She returns today for routine f/u. She is here with [name] from [employer]. Feels stable overall. Weight mostly stable. Goes up and down. Now taking lasix 80 daily. Takes metolazone if she starts gaining week. Has taken about 5 metolazone total. Still smoking 1/2 ppd. Not watching diet that closely. SBP 80-110. Walks with walker. Mild DOE. PCP just started metformin and lisinopril.   Labs 04/16/15   K 4.0, Creatinine 0.71 Labs 05/06/15 K 4.2, Creatinine 0.88  Echo 05/2014 EF 50-55%, grade 1 DD, mild LAE, mild RAE, Normal RV  PFTs 12/02/14 FVC 1.58 (48%) FEV1 1.09 (42%) DLCO 7.91 (32%)  Review of Systems: [y] = yes, _0  = no   General: Weight gain _1 ; Weight loss _2 ; Anorexia _3 ; Fatigue [y]; Fever _4 ; Chills _5 ; Weakness _6   Cardiac: Chest pain/pressure _7 ; Resting SOB _8 ; Exertional SOB [y]; Orthopnea [y]; Pedal Edema [y]; Palpitations _9 ; Syncope _10 ; Presyncope _11 ; Paroxysmal nocturnal dyspnea_12   Pulmonary: Cough [y]; Wheezing_13 ; Hemoptysis_14 ; Sputum [y]; Snoring _15   GI: Vomiting_16 ; Dysphagia_17 ; Melena_18 ;  Hematochezia _19 ; Heartburn_20 ; Abdominal pain _21 ; Constipation _22 ; Diarrhea _23 ; BRBPR _24   GU: Hematuria_25 ; Dysuria _26 ; Nocturia_27   Vascular: Pain in legs with walking _28 ; Pain in feet with lying flat _29 ; Non-healing sores _30 ; Stroke _31 ; TIA _32 ; Slurred speech _33 ;  Neuro: Headaches_34 ; Vertigo_35 ; Seizures_36 ; Paresthesias_37 ;Blurred vision _38 ; Diplopia _39 ; Vision changes _40   Ortho/Skin: Arthritis [y]; Joint pain [y]; Muscle pain _41 ; Joint swelling _42 ; Back Pain _43 ; Rash _44   Psych: Depression_45 ; Anxiety_46   Heme: Bleeding problems _47 ; Clotting disorders _48 ; Anemia _49   Endocrine: Diabetes _50 ; Thyroid dysfunction_51    Past Medical History  Diagnosis Date  . Coronary artery disease     S/P PCI of LAD with DES (12/2008). Total occlusion of RCA noted at that time., medically managed. ACS ruled out 03/2009 with Lexiscan myoview . Followed by [facility].  . Pulmonary hypertension ([place])     2-D Echo (86/7672) - Systolic pressure was moderately increased. PA peak pressure  39mHg. 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 Followed by [name]([name]  . Diastolic dysfunction     2-D Echo (12/2008) - Normal LV Systolic funciton with EF 60-65%. Grade 1  diastolid dysfunction. No regional wall motion abnormalities. Moderate pulmonary HTN with PA peak pressure 23mHg.  .Marland KitchenCOPD (chronic obstructive pulmonary disease) (HCC)     Severe. Gold Stage IV.  PFTs (12/2008) - severe obstructive airway disease. Active tobacco use. Requires 4L O2 at home.  . Pulmonary nodule, right     Small right middle lobe nodule. Stable as of 12/2008.  .Marland KitchenPrediabetes     HgbA1c 6.4 (12/2008)  . Hx MRSA infection     Recurrent MRSA thigh abscesses.  . Obesity   . Hyperlipidemia   . GERD (gastroesophageal reflux disease)     S/P Nissen fundoplication.  . CHF (congestive heart failure) ([facility]   . On  home oxygen therapy since 2010    4L all the time  . Shortness of breath dyspnea     with a lot of exertion; if fluid builds up  . Pneumonia   . Depression   . Full dentures   . Chronic bronchitis ([place]     "get it most years" (03/03/2015)  . Lung cancer ([facility]     "left"  . History of hiatal hernia   . Arthritis     "some scattered" (03/03/2015)    Current Outpatient Prescriptions  Medication Sig Dispense Refill  . albuterol (PROVENTIL) (2.5 MG/3ML) 0.083% nebulizer solution Take 3 mLs (2.5 mg total) by nebulization every 6 (six) hours as needed for shortness of breath. 360 mL 3  . albuterol (VENTOLIN HFA) 108 (90 BASE) MCG/ACT inhaler Inhale 2 puffs into the lungs every 6 (six) hours as needed for shortness of breath. 18 g 11  . aspirin 81 MG chewable tablet Chew 1 tablet (81 mg total) by mouth at bedtime. 90 tablet 3  . Aspirin-Salicylamide-Caffeine (BC HEADACHE POWDER PO) Take 1 packet by mouth 3 (three) times daily as needed (pain).    . Fluticasone-Salmeterol (ADVAIR DISKUS) 250-50 MCG/DOSE AEPB Inhale 1 puff into the lungs 2 (two) times daily. 60 each 11  . furosemide (LASIX) 40 MG tablet Take 80 mg by mouth daily.    .Marland Kitchengabapentin (NEURONTIN) 300 MG capsule Take 300 mg by mouth at bedtime.    .Marland Kitchenlisinopril (PRINIVIL,ZESTRIL) 5 MG tablet Take 1 tablet (5 mg total) by mouth daily. 90 tablet 3  . metFORMIN (GLUCOPHAGE) 500 MG tablet Take 1 tablet (500 mg total) by mouth 2 (two) times daily with a meal. 180 tablet 3  . metolazone (ZAROXOLYN) 2.5 MG tablet TAKE 1 TABLET BY MOUTH AS NEEDED(FOR 5 LBS WEIGHT GAIN) 90 tablet 3  . OXYGEN Inhale 4 L into the lungs continuous.    . pantoprazole (PROTONIX) 40 MG tablet TAKE 1 TABLET BY MOUTH DAILY 30 tablet 3  . potassium chloride SA (K-DUR,KLOR-CON) 20 MEQ tablet Take 1 tablet (20 mEq total) by mouth 2 (two) times daily. 180 tablet 3  . rosuvastatin (CRESTOR) 20 MG tablet Take 1 tablet (20 mg total) by mouth daily. 90 tablet 3  . tiotropium  (SPIRIVA HANDIHALER) 18 MCG inhalation capsule Place 1 capsule (18 mcg total) into inhaler and inhale daily. 30 capsule 12  . vitamin B-12 (CYANOCOBALAMIN) 1000 MCG tablet Take 1 tablet (1,000 mcg total) by mouth daily. 90 tablet 3   No current facility-administered medications for this encounter.    Allergies  Allergen Reactions  . Fluconazole Anaphylaxis, Itching and Swelling  . Atorvastatin Other (See Comments)    Dizziness after med started and resolved per pt after med stopped. September 08 2014  Social History   Social History  . Marital Status: Divorced    Spouse Name: N/A  . Number of Children: N/A  . Years of Education: N/A   Occupational History  . Not on file.   Social History Main Topics  . Smoking status: Current Every Day Smoker -- 0.50 packs/day for 45 years    Types: Cigarettes    Start date: 04/04/1969  . Smokeless tobacco: Never Used     Comment: 1 cig per day  . Alcohol Use: No  . Drug Use: No  . Sexual Activity: Not Currently    Birth Control/ Protection: None   Other Topics Concern  . Not on file   Social History Narrative   Formerly worked as a Scientist, water quality, now disabled.   Divorced.   2 grown children.   Lives with her grandson.      Family History  Problem Relation Age of Onset  . Heart disease Mother 38    Deceased from MI at 61yo  . Hypertension Mother   . Heart disease Father 3    Deceased of MI age 25yo  . Hypertension Father   . Hypertension Brother   . Lung cancer      Grandmother    Filed Vitals:   09/22/15 1348  BP: 94/62  Pulse: 74  Weight: 235 lb 8 oz (106.822 kg)  SpO2: 96%   Wt Readings from Last 3 Encounters:  09/22/15 235 lb 8 oz (106.822 kg)  09/10/15 235 lb 11.2 oz (106.913 kg)  07/07/15 246 lb 6.4 oz (111.766 kg)     PHYSICAL EXAM: General: Sitting in chair wearing O2. NAD HEENT: normal Neck: supple. JVD difficult, appears 6-7, Carotids 2+ bilat; no bruits. No thyromegaly or nodule noted. Cor: PMI  nondisplaced. RRR No rubs, gallops or murmurs. Lungs: Coarse throughout. Decreased BS  Abdomen: Obese, soft, NT, ND, no HSM. No bruits or masses. +BS  Extremities: no cyanosis, clubbing, rash. Trace edema Neuro: alert & oriented x 3, cranial nerves grossly intact. moves all 4 extremities w/o difficulty. Affect pleasant.  ECG: NSR 87 with PACs, Incomplete RBBB  ASSESSMENT & PLAN:  1. Chronic diastolic HF - ECHO 09/7670 EF 50-55%, Grade 1 DD - Volume status much improved. Will continue current regimen. Continue to take metolazone as needed. - Continue paramedicine. - Re-educated on limiting salt intake, keeping fluid below 2 L a day, and taking daily weights.  - labs today 2. CAD s/p PCI 2010 - No active symptoms.  - Continue ASA 81 mg and Lovastatin 40 mg daily.    2. COPD - on home 02 4L via [place] - counseled on need to quit smoking 3. Lung cancer, left - Stage IIA (T2b, N0, M0) non-small cell lung cancer, adenocarcinoma with negative EGFR mutation and negative ALK gene translocation, diagnosed in August 2016 and presented with left lower lobe lung mass.  - Underwent radio-curative therapy. Poor resection candidate with lung disease.  4. Obesity - encouraged to watch portions and increase activity as able.  5. Diabetes - Hgb A1C 7.7 05/04/15, recently started metformin - Needs to follow up with IM 6. OSA/?OHS - Snores very loudly - She says she is claustrophobic and is very unlikely to wear a CPAP mask, wishes to hold off at this time.    Bensimhon, Daniel,MD 2:11 PM

## 2015-09-23 ENCOUNTER — Ambulatory Visit
Admission: RE | Admit: 2015-09-23 | Discharge: 2015-09-23 | Disposition: A | Discharge status: Home / Self Care | Payer: Commercial Managed Care - HMO | Source: Ambulatory Visit

## 2015-09-23 ENCOUNTER — Other Ambulatory Visit: Payer: Self-pay | Admitting: Internal Medicine

## 2015-09-23 ENCOUNTER — Ambulatory Visit: Payer: Commercial Managed Care - HMO

## 2015-09-23 DIAGNOSIS — Z1231 Encounter for screening mammogram for malignant neoplasm of breast: Secondary | ICD-10-CM

## 2015-09-25 ENCOUNTER — Other Ambulatory Visit: Payer: Self-pay | Admitting: Internal Medicine

## 2015-09-25 DIAGNOSIS — R928 Other abnormal and inconclusive findings on diagnostic imaging of breast: Secondary | ICD-10-CM

## 2015-10-01 ENCOUNTER — Other Ambulatory Visit: Payer: Self-pay | Admitting: Internal Medicine

## 2015-10-01 DIAGNOSIS — J449 Chronic obstructive pulmonary disease, unspecified: Secondary | ICD-10-CM

## 2015-10-05 ENCOUNTER — Other Ambulatory Visit: Payer: Self-pay

## 2015-10-05 DIAGNOSIS — I5032 Chronic diastolic (congestive) heart failure: Secondary | ICD-10-CM

## 2015-10-05 NOTE — Telephone Encounter (Signed)
Requesting furosemide to be filled @ [facility] on [address].

## 2015-10-07 ENCOUNTER — Other Ambulatory Visit: Payer: Commercial Managed Care - HMO

## 2015-10-07 MED ORDER — FUROSEMIDE 40 MG PO TABS: ORAL_TABLET | ORAL | Status: DC

## 2015-10-12 ENCOUNTER — Ambulatory Visit
Admission: RE | Admit: 2015-10-12 | Discharge: 2015-10-12 | Disposition: A | Discharge status: Home / Self Care | Payer: Commercial Managed Care - HMO | Source: Ambulatory Visit | Attending: Internal Medicine | Admitting: Internal Medicine

## 2015-10-12 DIAGNOSIS — N63 Unspecified lump in breast: Secondary | ICD-10-CM | POA: Diagnosis not present

## 2015-10-12 DIAGNOSIS — R928 Other abnormal and inconclusive findings on diagnostic imaging of breast: Secondary | ICD-10-CM

## 2015-10-16 IMAGING — DX DG CHEST 1V PORT
1 series · 1 of 1 positions shown · non-contrast
Comparison: 01/09/2013.

CLINICAL DATA: Short of breath.  COPD.

EXAM:
PORTABLE CHEST - 1 VIEW

[portable]
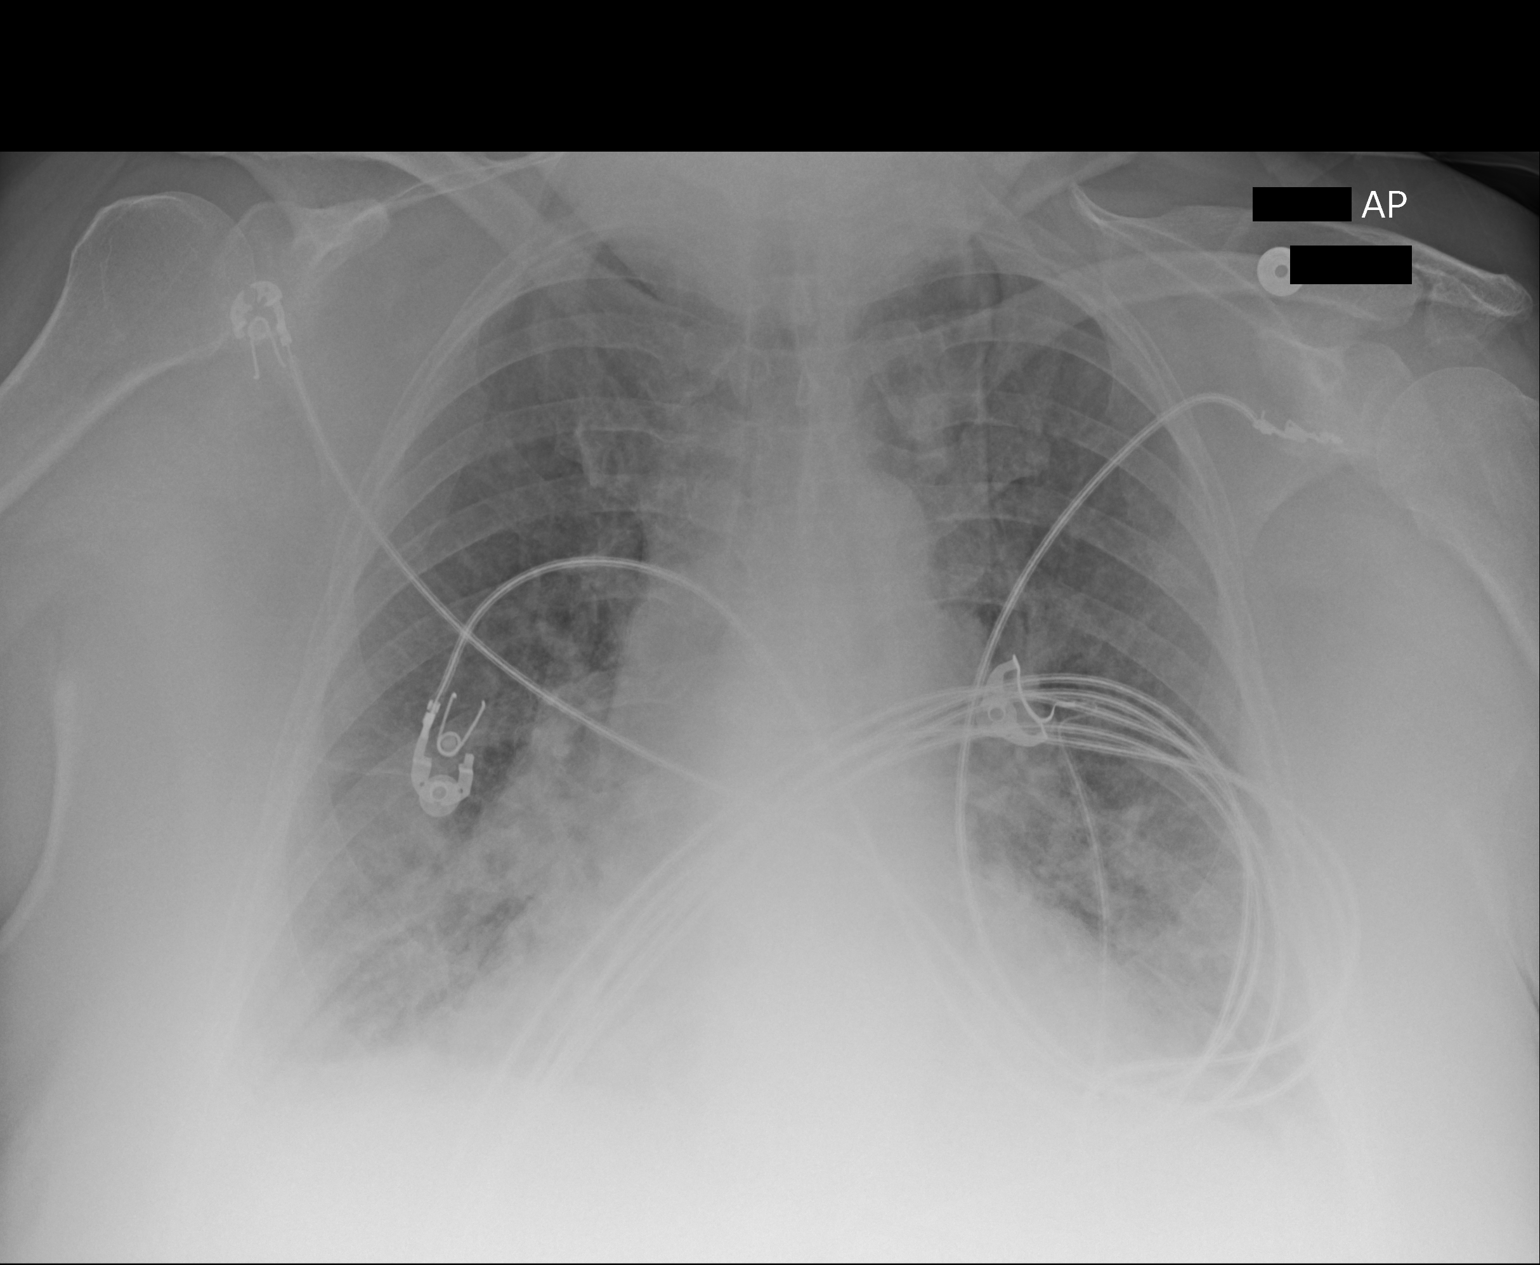

[1 of 1 positions shown; findings below may reference images not displayed]

FINDINGS: Cardiomegaly. Perihilar and basilar predominant airspace disease is
present compatible with CHF and pulmonary edema. Monitoring leads
project over the chest. Basilar atelectasis.
IMPRESSION: Moderate CHF.

## 2015-10-17 IMAGING — CR DG CHEST 2V
2 series · 2 of 2 positions shown · non-contrast
Comparison: Chest radiograph 03/11/2013

CLINICAL DATA: Short of breath, COPD

EXAM:
CHEST  2 VIEW

[w chest pa]
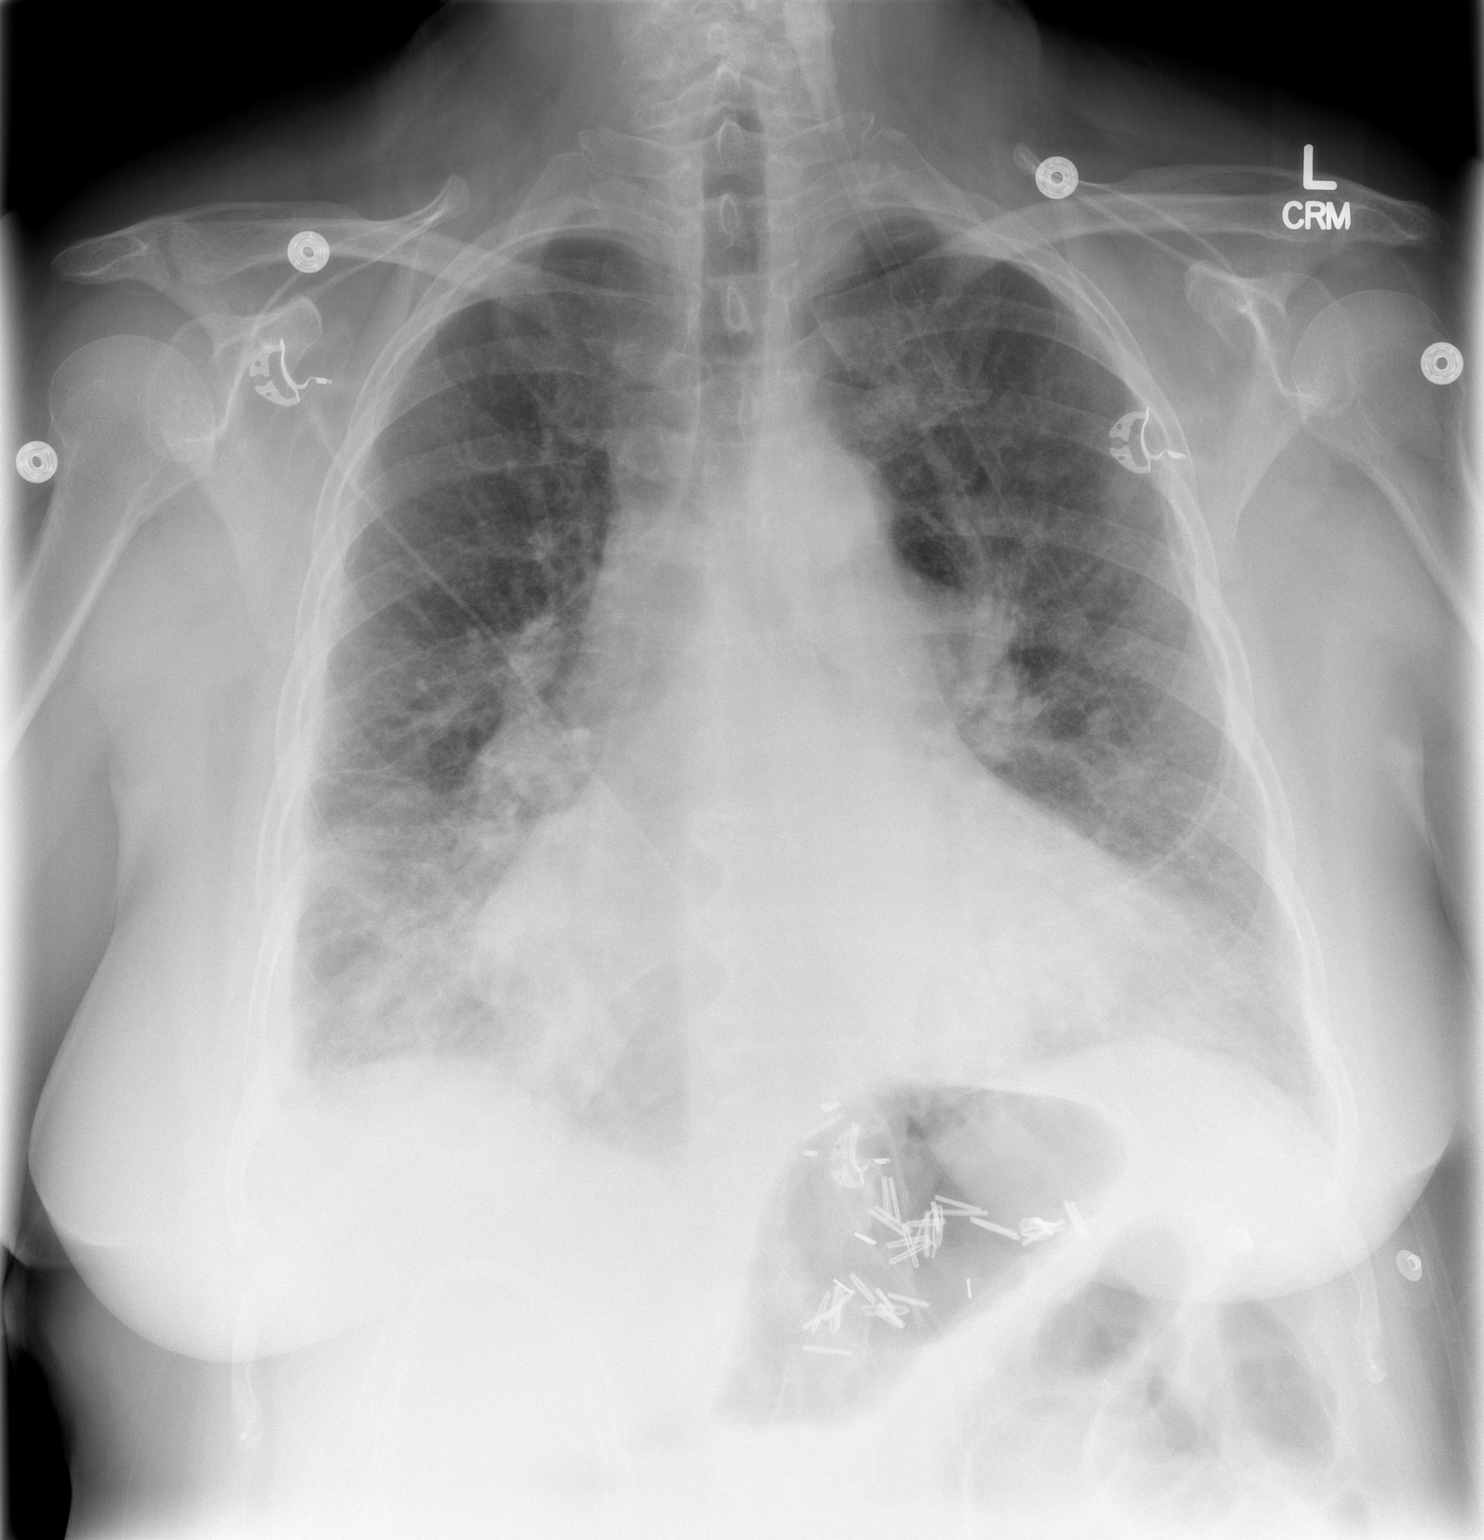

[w chest lat]
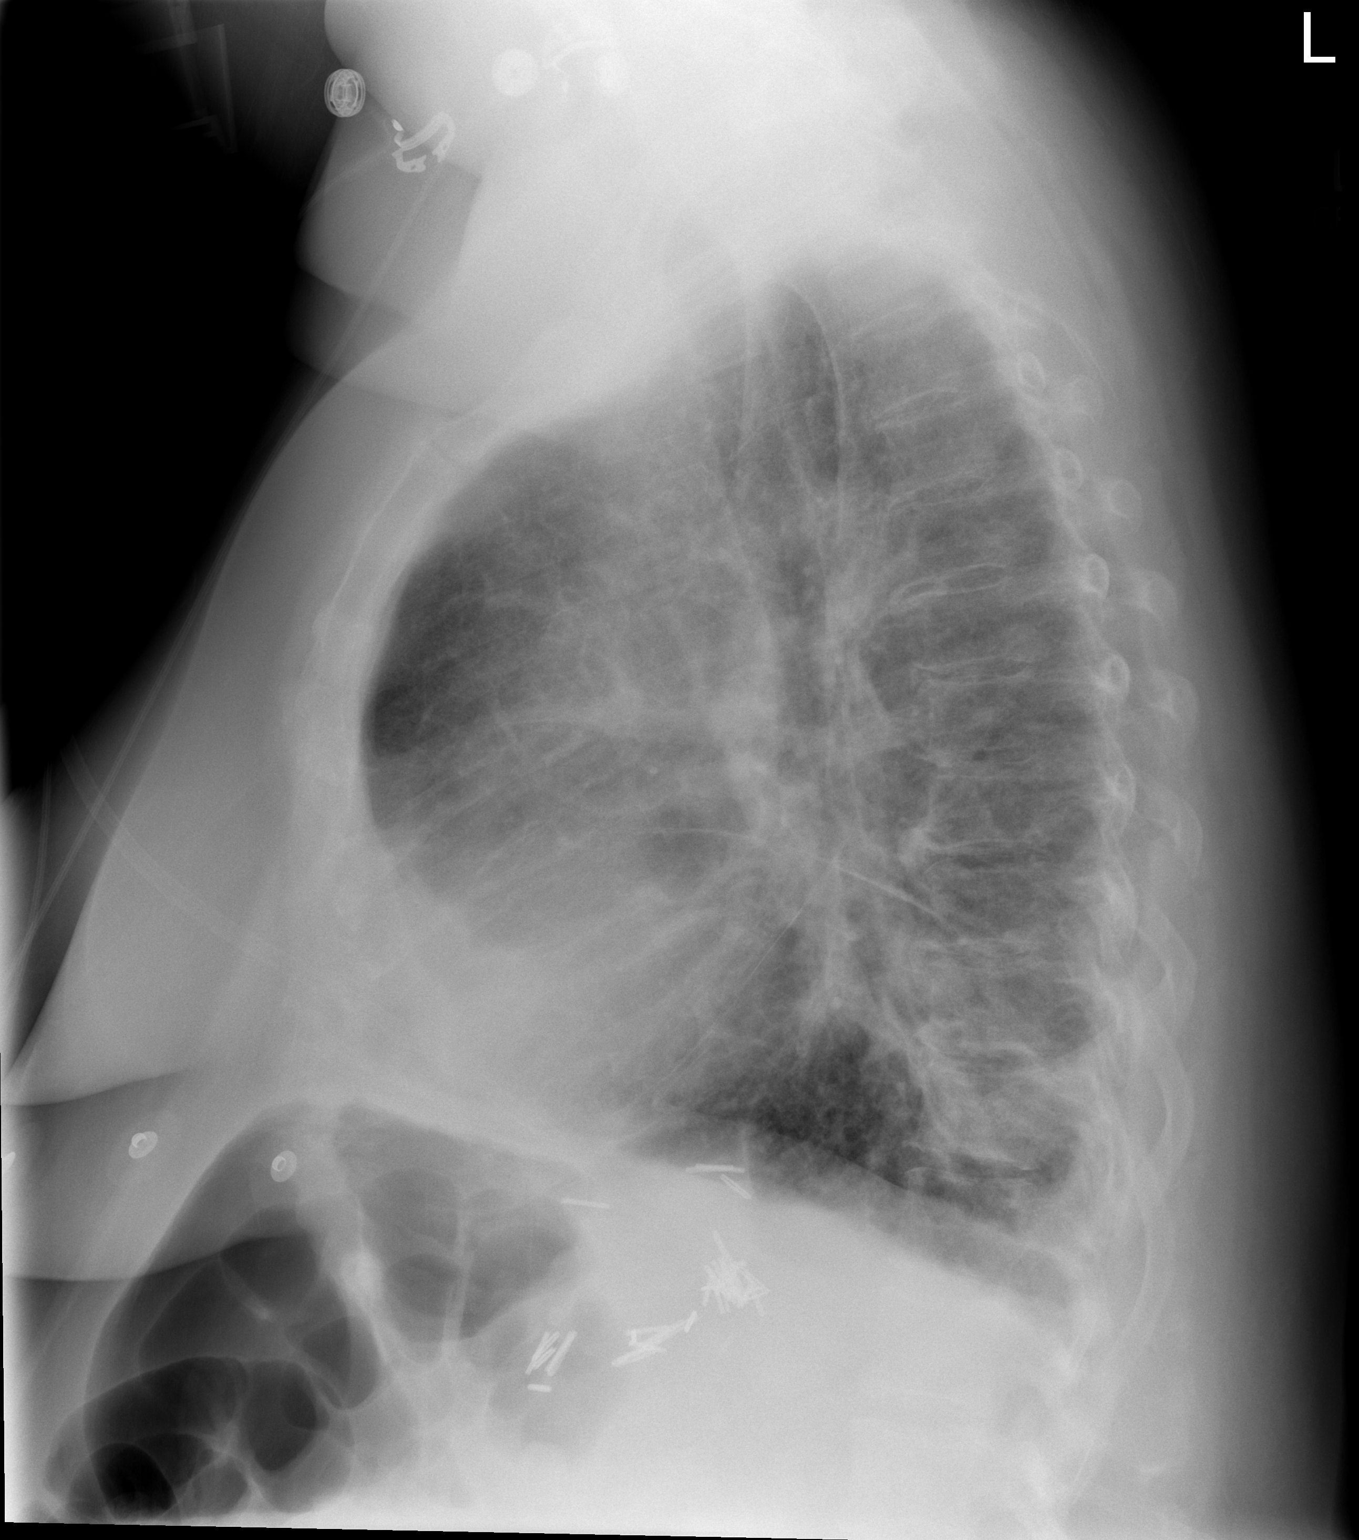

[2 of 2 positions shown; findings below may reference images not displayed]

FINDINGS: Stable enlarged cardiac silhouette. There is bibasilar airspace
disease . Trace effusions are present. There is central venous
congestion in the upper lobes. No pneumothorax
IMPRESSION: Bibasilar airspace opacity suggesting pulmonary edema versus
infiltrates or combination of both. No significant change from
prior.

## 2015-11-05 ENCOUNTER — Telehealth (HOSPITAL_COMMUNITY): Payer: Self-pay | Admitting: *Deleted

## 2015-11-05 ENCOUNTER — Other Ambulatory Visit: Payer: Self-pay | Admitting: Internal Medicine

## 2015-11-05 DIAGNOSIS — K219 Gastro-esophageal reflux disease without esophagitis: Secondary | ICD-10-CM

## 2015-11-05 DIAGNOSIS — I700 Atherosclerosis of aorta: Principal | ICD-10-CM

## 2015-11-05 DIAGNOSIS — E1151 Type 2 diabetes mellitus with diabetic peripheral angiopathy without gangrene: Secondary | ICD-10-CM

## 2015-11-05 MED ORDER — PANTOPRAZOLE SODIUM 40 MG PO TBEC: 40.0000 mg | DELAYED_RELEASE_TABLET | Freq: Every day | ORAL | 3 refills | Status: AC

## 2015-11-05 MED ORDER — LISINOPRIL 5 MG PO TABS: 5.0000 mg | ORAL_TABLET | Freq: Every day | ORAL | 3 refills | Status: DC

## 2015-11-05 NOTE — Telephone Encounter (Signed)
Katie called to report pt's BP is low today at 82 systolic, she reports in June it was 94/50 and a few weeks ago was 110/56.  HR is 86 and wt is stable.  Pt has been taking meds correctly and has not had to take any Metolazone or erxtra Lasix.  She reports pt is stable and asymptomatic.  Discussed w/Amy Ninfa Meeker, NP she recommends pt hold tom AM Lisinopril and start taking it in PM starting tomorrow evening.  Joellen Jersey is aware and will advise pt

## 2015-11-05 NOTE — Telephone Encounter (Signed)
Patient requesting a refill on her protonix

## 2015-11-10 ENCOUNTER — Telehealth: Payer: Self-pay | Admitting: Medical Oncology

## 2015-11-10 NOTE — Telephone Encounter (Signed)
She has not heard about an appt for her scan. I told her to call central scheduling and I will cc prior auth staff

## 2015-11-20 ENCOUNTER — Encounter (INDEPENDENT_AMBULATORY_CARE_PROVIDER_SITE_OTHER): Payer: Self-pay

## 2015-11-20 ENCOUNTER — Ambulatory Visit (INDEPENDENT_AMBULATORY_CARE_PROVIDER_SITE_OTHER): Payer: Commercial Managed Care - HMO | Admitting: Internal Medicine

## 2015-11-20 ENCOUNTER — Encounter: Payer: Self-pay | Admitting: Internal Medicine

## 2015-11-20 VITALS — BP 85/52 | HR 68 | Temp 99.0°F | Wt 241.1 lb

## 2015-11-20 DIAGNOSIS — E1159 Type 2 diabetes mellitus with other circulatory complications: Secondary | ICD-10-CM

## 2015-11-20 DIAGNOSIS — I5032 Chronic diastolic (congestive) heart failure: Secondary | ICD-10-CM

## 2015-11-20 DIAGNOSIS — E538 Deficiency of other specified B group vitamins: Secondary | ICD-10-CM | POA: Diagnosis not present

## 2015-11-20 DIAGNOSIS — E1151 Type 2 diabetes mellitus with diabetic peripheral angiopathy without gangrene: Secondary | ICD-10-CM

## 2015-11-20 DIAGNOSIS — Z0000 Encounter for general adult medical examination without abnormal findings: Secondary | ICD-10-CM

## 2015-11-20 DIAGNOSIS — G63 Polyneuropathy in diseases classified elsewhere: Secondary | ICD-10-CM

## 2015-11-20 DIAGNOSIS — E785 Hyperlipidemia, unspecified: Secondary | ICD-10-CM

## 2015-11-20 DIAGNOSIS — Z7984 Long term (current) use of oral hypoglycemic drugs: Secondary | ICD-10-CM

## 2015-11-20 DIAGNOSIS — J449 Chronic obstructive pulmonary disease, unspecified: Secondary | ICD-10-CM

## 2015-11-20 DIAGNOSIS — H8110 Benign paroxysmal vertigo, unspecified ear: Secondary | ICD-10-CM

## 2015-11-20 DIAGNOSIS — I700 Atherosclerosis of aorta: Secondary | ICD-10-CM | POA: Diagnosis not present

## 2015-11-20 DIAGNOSIS — I2510 Atherosclerotic heart disease of native coronary artery without angina pectoris: Secondary | ICD-10-CM | POA: Diagnosis not present

## 2015-11-20 DIAGNOSIS — Z720 Tobacco use: Secondary | ICD-10-CM

## 2015-11-20 DIAGNOSIS — C3492 Malignant neoplasm of unspecified part of left bronchus or lung: Secondary | ICD-10-CM

## 2015-11-20 DIAGNOSIS — F17210 Nicotine dependence, cigarettes, uncomplicated: Secondary | ICD-10-CM

## 2015-11-20 DIAGNOSIS — R6889 Other general symptoms and signs: Secondary | ICD-10-CM | POA: Diagnosis not present

## 2015-11-20 LAB — POCT GLYCOSYLATED HEMOGLOBIN (HGB A1C): Hemoglobin A1C: 6.5

## 2015-11-20 LAB — GLUCOSE, CAPILLARY: Glucose-Capillary: 120 mg/dL — ABNORMAL HIGH (ref 65–99)

## 2015-11-20 MED ORDER — CYANOCOBALAMIN 1000 MCG/ML IJ SOLN
1000.0000 ug | Freq: Once | INTRAMUSCULAR | Status: AC
  Administered 2015-11-20: 1000 ug via INTRAMUSCULAR

## 2015-11-20 MED ORDER — BUPROPION HCL ER (SR) 150 MG PO TB12: 150.0000 mg | ORAL_TABLET | Freq: Two times a day (BID) | ORAL | 0 refills | Status: DC

## 2015-11-20 MED ORDER — CYANOCOBALAMIN 1000 MCG/ML IJ SOLN: 1000.0000 ug | Freq: Once | INTRAMUSCULAR | 0 refills | Status: DC

## 2015-11-20 NOTE — Assessment & Plan Note (Signed)
Assessment  Her benign paroxysmal positional vertigo has continued to slowly improve despite the lack of vestibular rehabilitation. She states her symptoms are currently minimal at best and she is not interested in vestibular rehabilitation at this time.  Plan  We will reassess her symptoms of her benign paroxysmal positional vertigo at the follow-up visit.

## 2015-11-20 NOTE — Assessment & Plan Note (Signed)
Assessment  She continues to have some numbness in her feet, particularly in the first toe on the right. She states she's been compliant with her vitamin B12 pills and notes some improvement in her symptoms since the initiation of this therapy.  Plan  She was given 1000 g IM of vitamin B12 today to increase her hepatic B12 stores. She was encouraged to continue to take the oral vitamin B12 on a daily basis. A B12 level was drawn at this clinic visit and is pending at the time of this dictation.

## 2015-11-20 NOTE — Assessment & Plan Note (Signed)
Assessment  Her chronic shortness of breath is stable on her current regimen of oxygen 4 L/m at rest, fluticasone-salmeterol 250-50 1 puff twice daily, Spiriva 1 puff daily, and albuterol as needed. Unfortunately, she continues to smoke that is interested in pharmacotherapy to assist with quitting and this will be crucial in the long-term management of her chronic obstructive pulmonary disease.  Plan  We will continue the current inhaler regimen as noted above. Please see the tobacco abuse section for specifics on that therapy. We will reassess her pulmonary symptoms at the follow-up visit.

## 2015-11-20 NOTE — Assessment & Plan Note (Signed)
Assessment  She follows up with Dr. Earlie Server regarding her lung adenocarcinoma treated with curative intent radiotherapy next week.  Plan  I encouraged her to attend her follow-up appointment with Dr. Earlie Server to reassess for evidence of recurrence.

## 2015-11-20 NOTE — Assessment & Plan Note (Signed)
A hepatitis C was drawn today as part of the screen recommended for her birth cohort. In approximately 9 months she will be due for the Pneumovax 23 vaccination as she received the pneumococcal 13 vaccination at the last visit. At follow-up we will discuss her interests in a Pap smear and colonoscopy. A diabetic foot exam was completed today. She is otherwise up-to-date on her health care maintenance.

## 2015-11-20 NOTE — Progress Notes (Signed)
   Subjective:    Patient ID: [name], female    DOB: 1955-05-18, 60 y.o.   MRN: [id number]  SHADANA PRY is here for follow-up of her diabetes, COPD, chronic diastolic heart failure, and B12 neuropathy. Please see the A&P for the status of the pt's chronic medical problems.  Nicotine Dependence  Her urge triggers include company of smokers.   Review of Systems  Constitutional: Negative for activity change and appetite change.  Respiratory: Positive for shortness of breath.   Cardiovascular: Negative for chest pain, palpitations and leg swelling.  Neurological: Positive for light-headedness, numbness and headaches. Negative for dizziness.       Mild orthostatic lightheadedness Intermittent numbness of the right hand and persistent numbness of the right big toe.      Objective:   Physical Exam  Constitutional: She is oriented to person, place, and time. She appears well-developed and well-nourished. No distress.  HENT:  Head: Normocephalic and atraumatic.  Eyes: Conjunctivae are normal. Right eye exhibits no discharge. Left eye exhibits no discharge. No scleral icterus.  Cardiovascular: Normal rate, regular rhythm and normal heart sounds.   Pulmonary/Chest: Effort normal and breath sounds normal. No respiratory distress. She has no wheezes. She has no rales.  Abdominal: Soft. Bowel sounds are normal. She exhibits no distension. There is no tenderness. There is no rebound and no guarding.  Musculoskeletal: Normal range of motion. She exhibits no edema.  Neurological: She is alert and oriented to person, place, and time. She exhibits normal muscle tone.  Skin: Skin is warm and dry. No rash noted. She is not diaphoretic. No erythema. No pallor.  Psychiatric: She has a normal mood and affect. Her behavior is normal. Judgment and thought content normal.  Nursing note and vitals reviewed.     Assessment & Plan:   Please see problem oriented charting.

## 2015-11-20 NOTE — Assessment & Plan Note (Signed)
Assessment  She continues to remain pain-free with regards to her underlying coronary artery disease. This is on aspirin 81 mg by mouth daily and rosuvastatin 20 mg by mouth daily.  Plan  We will continue the aspirin and rosuvastatin at their current doses. As noted in the tobacco cessation section we are also pharmacologically treating her tobacco dependence in hopes of her quitting in the near future. We will reassess for cardiac chest pain at the follow-up visit.

## 2015-11-20 NOTE — Assessment & Plan Note (Signed)
Assessment  Her diabetes is well controlled today with a hemoglobin A1c of 6.5, down from 7.3. This is on metformin 500 mg by mouth twice daily. She has tolerated the initiation of the metformin therapy, specifically denying any GI side effects. She has no symptoms consistent with claudication that would suggest symptomatic atherosclerosis of the aorta.  Plan  We will continue the metformin at 500 mg by mouth twice daily and repeat the hemoglobin A1c at the follow-up visit to assure she is within target range of less than 7.0.

## 2015-11-20 NOTE — Assessment & Plan Note (Signed)
Assessment  Her systolic blood pressure remains low at 85 although she is minimally symptomatic with this. She denies a change in her underlying chronic dyspnea, mostly related to her pulmonary disease. She denies paroxysmal nocturnal dyspnea but prefers to sleep sitting up and therefore we are unable to assess for orthopnea. Her current heart failure regimen includes Lasix 80 mg in the morning and 40 mg at night, metolazone 2.5 mg as needed which she states she rarely takes, and lisinopril 5 mg by mouth at bedtime.  Plan  We will continue with the Lasix, lisinopril, and as needed metolazone at the current doses. We will reassess her heart failure symptoms at the follow-up visit. As long as she is only minimally symptomatic we will tolerate the low afterload.

## 2015-11-20 NOTE — Assessment & Plan Note (Signed)
Assessment  She is tolerating the rosuvastatin at 20 mg by mouth daily without myalgias.  Plan  We will continue with the high intensity statin. We will reassess her tolerance of this medication at the follow-up visit.

## 2015-11-20 NOTE — Patient Instructions (Signed)
It was great to see you again.  1) Keep taking the medications as you are.  2) I wrote you a letter for your dog.  3) We started bupropion SR 150 mg.  Start with 150 mg once a day for 3 days then take 150 mg twice a day for three months.  If this helps with quitting smoking and you feel you need another three months let me know and I will refill it.  Don't try to quit smoking until you are on the medication for at least 1 week.  4) We gave you a B12 shot today.  5) I will call you with the results of your labs early next week.  I will see you back in three months, sooner if necessary.

## 2015-11-20 NOTE — Assessment & Plan Note (Signed)
Assessment  [name] has thought about smoking cessation and my prior offer for pharmacotherapy assistance. She is willing to give bupropion another try. She was praised for her interest in smoking cessation given the tremendous health benefits she is likely to receive from it.  Plan  She was started on Wellbutrin 150 mg by mouth daily for 3 days to be followed by Wellbutrin 150 mg by mouth twice daily for 3 months. She was asked to call me if she was able to quit smoking and after 3 months felt she needed another course of the Wellbutrin. If she does so I will refill it for an additional 3 months. We will reassess the success of her smoking cessation attempt with the Wellbutrin at the follow-up visit.

## 2015-11-21 LAB — VITAMIN B12: Vitamin B-12: 305 pg/mL (ref 211–946)

## 2015-11-21 LAB — BMP8+ANION GAP
ANION GAP: 15 mmol/L (ref 10.0–18.0)
BUN/Creatinine Ratio: 24 (ref 12–28)
BUN: 31 mg/dL — AB (ref 8–27)
CALCIUM: 9.1 mg/dL (ref 8.7–10.3)
CHLORIDE: 88 mmol/L — AB (ref 96–106)
CO2: 30 mmol/L — ABNORMAL HIGH (ref 18–29)
Creatinine, Ser: 1.31 mg/dL — ABNORMAL HIGH (ref 0.57–1.00)
GFR, EST AFRICAN AMERICAN: 51 mL/min/{1.73_m2} — AB (ref >59)
GFR, EST NON AFRICAN AMERICAN: 44 mL/min/{1.73_m2} — AB (ref >59)
Glucose: 113 mg/dL — ABNORMAL HIGH (ref 65–99)
Potassium: 5.8 mmol/L — ABNORMAL HIGH (ref 3.5–5.2)
Sodium: 133 mmol/L — ABNORMAL LOW (ref 134–144)

## 2015-11-21 LAB — HEPATITIS C ANTIBODY: Hep C Virus Ab: <0.1 s/co ratio (ref 0.0–0.9)

## 2015-11-24 NOTE — Progress Notes (Signed)
Patient ID: [name], female   DOB: 10/17/1955, 60 y.o.   MRN: [id number]  BMP: Na 133, K 5.8, BUN 31, Cr 1.31, eGFR 44  Kidney function is stable at stage 3.  My suspicion is that she is pre-renal related to her diuretics and the summer heat.  Given this needed balance clinically between the two organ systems and the resultant compromised eGFR I do not believe she requires as much potassium supplementation.  She is taking KCl 20 mEq BID.  I have asked her to decrease it to KCl 20 mEq daily.  She has already taken this morning's dose so she knows to no longer take the evening dose.  She is scheduled to have labs drawn in the AM in Oncology and I will also follow-up on those values.  Vitamin B12 305 (up from 161).    Now within the normal range again, but the low normal range.  We will continue the vitamin B12 oral supplementation and continue to booster her intake with vitamin B12 1000 mcg IM each clinic visit.  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  Hepatitis C Ab: Negative.

## 2015-11-25 ENCOUNTER — Other Ambulatory Visit (HOSPITAL_BASED_OUTPATIENT_CLINIC_OR_DEPARTMENT_OTHER): Payer: Commercial Managed Care - HMO

## 2015-11-25 ENCOUNTER — Ambulatory Visit (HOSPITAL_COMMUNITY)
Admission: RE | Admit: 2015-11-25 | Discharge: 2015-11-25 | Disposition: A | Discharge status: Home / Self Care | Payer: Commercial Managed Care - HMO | Source: Ambulatory Visit | Attending: Internal Medicine | Admitting: Internal Medicine

## 2015-11-25 DIAGNOSIS — C3432 Malignant neoplasm of lower lobe, left bronchus or lung: Secondary | ICD-10-CM

## 2015-11-25 DIAGNOSIS — C3492 Malignant neoplasm of unspecified part of left bronchus or lung: Secondary | ICD-10-CM | POA: Diagnosis not present

## 2015-11-25 DIAGNOSIS — J701 Chronic and other pulmonary manifestations due to radiation: Secondary | ICD-10-CM | POA: Diagnosis not present

## 2015-11-25 DIAGNOSIS — I2510 Atherosclerotic heart disease of native coronary artery without angina pectoris: Secondary | ICD-10-CM | POA: Insufficient documentation

## 2015-11-25 DIAGNOSIS — R6889 Other general symptoms and signs: Secondary | ICD-10-CM | POA: Diagnosis not present

## 2015-11-25 DIAGNOSIS — J90 Pleural effusion, not elsewhere classified: Secondary | ICD-10-CM | POA: Diagnosis not present

## 2015-11-25 DIAGNOSIS — I700 Atherosclerosis of aorta: Secondary | ICD-10-CM | POA: Insufficient documentation

## 2015-11-25 DIAGNOSIS — I517 Cardiomegaly: Secondary | ICD-10-CM | POA: Diagnosis not present

## 2015-11-25 DIAGNOSIS — I288 Other diseases of pulmonary vessels: Secondary | ICD-10-CM | POA: Diagnosis not present

## 2015-11-25 DIAGNOSIS — I313 Pericardial effusion (noninflammatory): Secondary | ICD-10-CM | POA: Insufficient documentation

## 2015-11-25 LAB — COMPREHENSIVE METABOLIC PANEL
ANION GAP: 10 meq/L (ref 3–11)
AST: 7 U/L (ref 5–34)
Albumin: 3.1 g/dL — ABNORMAL LOW (ref 3.5–5.0)
Alkaline Phosphatase: 152 U/L — ABNORMAL HIGH (ref 40–150)
BILIRUBIN TOTAL: 0.32 mg/dL (ref 0.20–1.20)
BUN: 21.4 mg/dL (ref 7.0–26.0)
CALCIUM: 9.4 mg/dL (ref 8.4–10.4)
CO2: 35 meq/L — AB (ref 22–29)
CREATININE: 1.6 mg/dL — AB (ref 0.6–1.1)
Chloride: 90 mEq/L — ABNORMAL LOW (ref 98–109)
EGFR: 35 mL/min/{1.73_m2} — ABNORMAL LOW (ref >90)
Glucose: 115 mg/dl (ref 70–140)
Potassium: 4.4 mEq/L (ref 3.5–5.1)
Sodium: 135 mEq/L — ABNORMAL LOW (ref 136–145)
TOTAL PROTEIN: 6.8 g/dL (ref 6.4–8.3)

## 2015-11-25 LAB — CBC WITH DIFFERENTIAL/PLATELET
BASO%: 0.3 % (ref 0.0–2.0)
Basophils Absolute: 0 10*3/uL (ref 0.0–0.1)
EOS%: 6.9 % (ref 0.0–7.0)
Eosinophils Absolute: 0.6 10*3/uL — ABNORMAL HIGH (ref 0.0–0.5)
HEMATOCRIT: 34.7 % — AB (ref 34.8–46.6)
HGB: 11.1 g/dL — ABNORMAL LOW (ref 11.6–15.9)
LYMPH#: 0.8 10*3/uL — AB (ref 0.9–3.3)
LYMPH%: 9.6 % — ABNORMAL LOW (ref 14.0–49.7)
MCH: 29.1 pg (ref 25.1–34.0)
MCHC: 32 g/dL (ref 31.5–36.0)
MCV: 91.1 fL (ref 79.5–101.0)
MONO#: 0.8 10*3/uL (ref 0.1–0.9)
MONO%: 9.5 % (ref 0.0–14.0)
NEUT%: 73.7 % (ref 38.4–76.8)
NEUTROS ABS: 6.4 10*3/uL (ref 1.5–6.5)
PLATELETS: 171 10*3/uL (ref 145–400)
RBC: 3.81 10*6/uL (ref 3.70–5.45)
RDW: 15 % — ABNORMAL HIGH (ref 11.2–14.5)
WBC: 8.7 10*3/uL (ref 3.9–10.3)

## 2015-11-25 MED ORDER — IOPAMIDOL (ISOVUE-300) INJECTION 61%
75.0000 mL | Freq: Once | INTRAVENOUS | Status: AC | PRN
  Administered 2015-11-25: 60 mL via INTRAVENOUS

## 2015-11-30 ENCOUNTER — Other Ambulatory Visit: Payer: Self-pay | Admitting: Internal Medicine

## 2015-11-30 DIAGNOSIS — I5032 Chronic diastolic (congestive) heart failure: Secondary | ICD-10-CM

## 2015-11-30 MED ORDER — POTASSIUM CHLORIDE CRYS ER 20 MEQ PO TBCR: 20.0000 meq | EXTENDED_RELEASE_TABLET | Freq: Every day | ORAL | 3 refills | Status: DC

## 2015-12-02 ENCOUNTER — Encounter: Payer: Self-pay | Admitting: Internal Medicine

## 2015-12-02 ENCOUNTER — Ambulatory Visit (HOSPITAL_BASED_OUTPATIENT_CLINIC_OR_DEPARTMENT_OTHER): Payer: Commercial Managed Care - HMO | Admitting: Internal Medicine

## 2015-12-02 VITALS — BP 104/54 | HR 80 | Temp 98.3°F | Resp 16 | Ht 65.0 in | Wt 238.6 lb

## 2015-12-02 DIAGNOSIS — C3492 Malignant neoplasm of unspecified part of left bronchus or lung: Secondary | ICD-10-CM

## 2015-12-02 DIAGNOSIS — R0602 Shortness of breath: Secondary | ICD-10-CM | POA: Diagnosis not present

## 2015-12-02 DIAGNOSIS — R6889 Other general symptoms and signs: Secondary | ICD-10-CM | POA: Diagnosis not present

## 2015-12-02 DIAGNOSIS — C3432 Malignant neoplasm of lower lobe, left bronchus or lung: Secondary | ICD-10-CM

## 2015-12-02 DIAGNOSIS — J449 Chronic obstructive pulmonary disease, unspecified: Secondary | ICD-10-CM

## 2015-12-02 NOTE — Progress Notes (Signed)
[facility] [phone]   [phone]  OFFICE PROGRESS NOTE  Karren Cobble, MD [address] [place] [address]  DIAGNOSIS: Stage IIA (T2b, N0, M0) non-small cell lung cancer, adenocarcinoma with negative EGFR mutation and negative ALK gene translocation, diagnosed in August 2016 and presented with left lower lobe lung mass.   PRIOR THERAPY: Curative radiotherapy to the left lower lobe lung mass under the care of Dr. Pablo Ledger  CURRENT THERAPY: Observation  INTERVAL HISTORY: [name] 60 y.o. female returns to the clinic today for follow-up visit accompanied by a family member. The patient is feeling fine today with no specific complaints except for the baseline shortness of breath and she is currently on home oxygen 4 L/min. she is followed by Dr. Eppie Gibson was entered on meds in and Dr. Raeford Razor from cardiology. She denied having any significant chest pain, or hemoptysis. She has no weight loss or night sweats. She has no nausea or vomiting. She denied having any bleeding issues. She had repeat CT scan of the chest recently and she is here for evaluation and discussion of her scan results.  MEDICAL HISTORY: Past Medical History:  Diagnosis Date  . Arthritis    "some scattered" (03/03/2015)  . CHF (congestive heart failure) ([place])   . Chronic bronchitis ([place])    "get it most years" (03/03/2015)  . COPD (chronic obstructive pulmonary disease) ([facility])    Severe. Gold Stage IV.  PFTs (12/2008) - severe obstructive airway disease. Active tobacco use. Requires 4L O2 at home.  . Coronary artery disease    S/P PCI of LAD with DES (12/2008). Total occlusion of RCA noted at that time., medically managed. ACS ruled out 03/2009 with Lexiscan myoview . Followed by [facility].  . Depression   . Diastolic dysfunction    2-D Echo (12/2008) - Normal LV Systolic funciton with EF 60-65%. Grade 1 diastolid dysfunction. No regional wall motion abnormalities.  Moderate pulmonary HTN with PA peak pressure 55mHg.  . Full dentures   . GERD (gastroesophageal reflux disease)    S/P Nissen fundoplication.  .Marland KitchenHistory of hiatal hernia   . Hx MRSA infection    Recurrent MRSA thigh abscesses.  . Hyperlipidemia   . Lung cancer ([facility]    "left"  . Obesity   . On home oxygen therapy since 2010   4L all the time  . Pneumonia   . Prediabetes    HgbA1c 6.4 (12/2008)  . Pulmonary hypertension ([place] [facility]    2-D Echo (068/0881 - Systolic pressure was moderately increased. PA peak pressure  532mg. 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 Followed by Dr. WrJoya GaskinsLVelora Heckler . Pulmonary nodule, right    Small right middle lobe nodule. Stable as of 12/2008.  . Marland Kitchenhortness of breath dyspnea    with a lot of exertion; if fluid builds up    ALLERGIES:  is allergic to fluconazole and atorvastatin.  MEDICATIONS:  Current Outpatient Prescriptions  Medication Sig Dispense Refill  . albuterol (PROVENTIL) (2.5 MG/3ML) 0.083% nebulizer solution Take 3 mLs (2.5 mg total) by nebulization every 6 (six) hours as needed for shortness of breath. 360 mL 3  . albuterol (VENTOLIN HFA) 108 (90 BASE) MCG/ACT inhaler Inhale 2 puffs into the lungs every 6 (six) hours as needed for shortness of breath. 18 g 11  . aspirin 81 MG chewable tablet  Chew 1 tablet (81 mg total) by mouth at bedtime. 90 tablet 3  . Aspirin-Salicylamide-Caffeine (BC HEADACHE POWDER PO) Take 1 packet by mouth 3 (three) times daily as needed (pain).    Marland Kitchen buPROPion (WELLBUTRIN SR) 150 MG 12 hr tablet Take 1 tablet (150 mg total) by mouth 2 (two) times daily. Start with 150 mg once daily for 3 days 180 tablet 0  . Fluticasone-Salmeterol (ADVAIR DISKUS) 250-50 MCG/DOSE AEPB Inhale 1 puff into the lungs 2 (two) times daily. 60 each 11  . furosemide (LASIX) 40 MG tablet Take 80 mg (2 tablets) in the morning and 40 mg (1  tablet) in the afternoon 270 tablet 3  . gabapentin (NEURONTIN) 300 MG capsule Take 300 mg by mouth at bedtime.    Marland Kitchen lisinopril (PRINIVIL,ZESTRIL) 5 MG tablet Take 1 tablet (5 mg total) by mouth at bedtime. 90 tablet 3  . metFORMIN (GLUCOPHAGE) 500 MG tablet Take 1 tablet (500 mg total) by mouth 2 (two) times daily with a meal. 180 tablet 3  . metolazone (ZAROXOLYN) 2.5 MG tablet TAKE 1 TABLET BY MOUTH AS NEEDED(FOR 5 LBS WEIGHT GAIN) 90 tablet 3  . OXYGEN Inhale 4 L into the lungs continuous.    . pantoprazole (PROTONIX) 40 MG tablet Take 1 tablet (40 mg total) by mouth daily. 90 tablet 3  . potassium chloride SA (K-DUR,KLOR-CON) 20 MEQ tablet Take 1 tablet (20 mEq total) by mouth daily. 90 tablet 3  . rosuvastatin (CRESTOR) 20 MG tablet Take 1 tablet (20 mg total) by mouth daily. 90 tablet 3  . tiotropium (SPIRIVA HANDIHALER) 18 MCG inhalation capsule Place 1 capsule (18 mcg total) into inhaler and inhale daily. 90 capsule 3  . vitamin B-12 (CYANOCOBALAMIN) 1000 MCG tablet Take 1 tablet (1,000 mcg total) by mouth daily. 90 tablet 3   No current facility-administered medications for this visit.     SURGICAL HISTORY:  Past Surgical History:  Procedure Laterality Date  . ABDOMINAL HYSTERECTOMY  1980's   "still have my cervix"  . APPENDECTOMY    . BACK SURGERY    . BILATERAL OOPHORECTOMY  1979  . BREAST LUMPECTOMY Right 1990's    (biopsy negative)  . CORONARY ANGIOPLASTY WITH STENT PLACEMENT  2010   [place]; RCA  . HERNIA REPAIR    . KNEE ARTHROSCOPY Right ~ 2000  . [place] SURGERY  2001   "herniated discs; [facility], Dr. Lorin Mercy"  . NISSEN FUNDOPLICATION    . RIGHT HEART CATHETERIZATION N/A 11/09/2012   Procedure: RIGHT HEART CATH;  Surgeon: Larey Dresser, MD;  Location: [facility] CATH LAB;  Service: Cardiovascular;  Laterality: N/A;  . TONSILLECTOMY    . TUBAL LIGATION  1979  . VIDEO BRONCHOSCOPY WITH ENDOBRONCHIAL ULTRASOUND N/A 12/15/2014   Procedure: VIDEO BRONCHOSCOPY WITH  ENDOBRONCHIAL ULTRASOUND;  Surgeon: Ivin Poot, MD;  Location: [facility] OR;  Service: Thoracic;  Laterality: N/A;    REVIEW OF SYSTEMS:  A comprehensive review of systems was negative except for: Constitutional: positive for fatigue Respiratory: positive for cough and dyspnea on exertion   PHYSICAL EXAMINATION: General appearance: alert, cooperative, fatigued and no distress Head: Normocephalic, without obvious abnormality, atraumatic Neck: no adenopathy, no JVD, supple, symmetrical, trachea midline and thyroid not enlarged, symmetric, no tenderness/mass/nodules Lymph nodes: Cervical, supraclavicular, and axillary nodes normal. Resp: wheezes bilaterally Back: symmetric, no curvature. ROM normal. No CVA tenderness. Cardio: regular rate and rhythm, S1, S2 normal, no murmur, click, rub or gallop GI: soft, non-tender; bowel sounds  normal; no masses,  no organomegaly Extremities: edema 2+ Neurologic: Alert and oriented X 3, normal strength and tone. Normal symmetric reflexes. Normal coordination and gait  ECOG PERFORMANCE STATUS: 1 - Symptomatic but completely ambulatory  Blood pressure (!) 104/54, pulse 80, temperature 98.3 F (36.8 C), temperature source Oral, resp. rate 16, height _0  (1.651 m), weight 238 lb 9.6 oz (108.2 kg), SpO2 (!) 86 %.  LABORATORY DATA: Lab Results  Component Value Date   WBC 8.7 11/25/2015   HGB 11.1 (L) 11/25/2015   HCT 34.7 (L) 11/25/2015   MCV 91.1 11/25/2015   PLT 171 11/25/2015      Chemistry      Component Value Date/Time   NA 135 (L) 11/25/2015 0937   K 4.4 11/25/2015 0937   CL 88 (L) 11/20/2015 1017   CO2 35 (H) 11/25/2015 0937   BUN 21.4 11/25/2015 0937   CREATININE 1.6 (H) 11/25/2015 0937      Component Value Date/Time   CALCIUM 9.4 11/25/2015 0937   ALKPHOS 152 (H) 11/25/2015 0937   AST 7 11/25/2015 0937   ALT <9 11/25/2015 0937   BILITOT 0.32 11/25/2015 0937       RADIOGRAPHIC STUDIES: Ct Chest W Contrast  Result Date:  11/25/2015 CLINICAL DATA:  Re- stage left lower lobe lung cancer status post radiation therapy. EXAM: CT CHEST WITH CONTRAST TECHNIQUE: Multidetector CT imaging of the chest was performed during intravenous contrast administration. CONTRAST:  64m ISOVUE-300 IOPAMIDOL (ISOVUE-300) INJECTION 61% COMPARISON:  04/24/2015 chest CT. FINDINGS: Mediastinum/Nodes: Stable mild cardiomegaly. Trace pericardial effusion/thickening. Left anterior descending, left circumflex and right coronary atherosclerosis. Atherosclerotic nonaneurysmal thoracic aorta. Stable dilated main pulmonary artery (3.7 cm diameter). No central pulmonary emboli. No discrete thyroid nodules. Unremarkable esophagus. No pathologically enlarged axillary, mediastinal or hilar lymph nodes. Stable top-normal 0.9 cm right paratracheal node (series 2/ image 36). Lungs/Pleura: No pneumothorax. No right pleural effusion. New small layering left pleural effusion. Mild centrilobular and paraseptal emphysema. Stable mosaic attenuation throughout both lungs. There is masslike fibrosis in the peripheral left lower lobe and inferior segment of the lingula measuring 9.0 x 5.7 cm in maximum axial dimensions (series 5/ image 106), previously 9.4 x 6.1 cm, slightly decreased. There is worsened volume loss in the left lower lobe. No acute consolidative airspace disease or new significant pulmonary nodules. Upper abdomen: Postsurgical changes from Nissen fundoplication. Musculoskeletal: No aggressive appearing focal osseous lesions. Mild-to-moderate thoracic spondylosis. IMPRESSION: 1. Evolving masslike radiation fibrosis in the left lower lobe, with no specific findings to suggest local tumor recurrence. 2. No evidence of metastatic disease in the chest. 3. New small left pleural effusion and trace pericardial effusion/thickening. 4. Stable dilated main pulmonary artery suggesting chronic pulmonary hypertension. Stable mosaic attenuation throughout the lungs, which could be  due to mosaic perfusion from pulmonary vascular disease and/or air trapping. 5. Additional findings include mild cardiomegaly, aortic atherosclerosis and three-vessel coronary atherosclerosis. Electronically Signed   By: JIlona SorrelM.D.   On: 11/25/2015 13:02   ASSESSMENT AND PLAN: This is a very pleasant 60years old white female recently diagnosed with a stage IIA non-small cell lung cancer. Biopsy of the mediastinal lymph nodes showed no malignant cells. She is status post curative radiotherapy to the left lower lobe lung mass. Restaging CT scan of the chest showed no evidence for disease progression. I discussed the scan results with the patient. I recommended for her to continue on observation with repeat CT scan of the chest in 6 months. The  patient will continue on home oxygen for her shortness breath as recommended by her primary care physician. She was advised to call immediately if she has any concerning symptoms in the interval. The patient voices understanding of current disease status and treatment options and is in agreement with the current care plan.  All questions were answered. The patient knows to call the clinic with any problems, questions or concerns. We can certainly see the patient much sooner if necessary.  Disclaimer: This note was dictated with voice recognition software. Similar sounding words can inadvertently be transcribed and may not be corrected upon review.

## 2015-12-02 NOTE — Patient Instructions (Signed)
Smoking Cessation, Tips for Success If you are ready to quit smoking, congratulations! You have chosen to help yourself be healthier. Cigarettes bring nicotine, tar, carbon monoxide, and other irritants into your body. Your lungs, heart, and blood vessels will be able to work better without these poisons. There are many different ways to quit smoking. Nicotine gum, nicotine patches, a nicotine inhaler, or nicotine nasal spray can help with physical craving. Hypnosis, support groups, and medicines help break the habit of smoking. WHAT THINGS CAN I DO TO MAKE QUITTING EASIER?  Here are some tips to help you quit for good:  Pick a date when you will quit smoking completely. Tell all of your friends and family about your plan to quit on that date.  Do not try to slowly cut down on the number of cigarettes you are smoking. Pick a quit date and quit smoking completely starting on that day.  Throw away all cigarettes.   Clean and remove all ashtrays from your home, work, and car.  On a card, write down your reasons for quitting. Carry the card with you and read it when you get the urge to smoke.  Cleanse your body of nicotine. Drink enough water and fluids to keep your urine clear or pale yellow. Do this after quitting to flush the nicotine from your body.  Learn to predict your moods. Do not let a bad situation be your excuse to have a cigarette. Some situations in your life might tempt you into wanting a cigarette.  Never have "just one" cigarette. It leads to wanting another and another. Remind yourself of your decision to quit.  Change habits associated with smoking. If you smoked while driving or when feeling stressed, try other activities to replace smoking. Stand up when drinking your coffee. Brush your teeth after eating. Sit in a different chair when you read the paper. Avoid alcohol while trying to quit, and try to drink fewer caffeinated beverages. Alcohol and caffeine may urge you to  smoke.  Avoid foods and drinks that can trigger a desire to smoke, such as sugary or spicy foods and alcohol.  Ask people who smoke not to smoke around you.  Have something planned to do right after eating or having a cup of coffee. For example, plan to take a walk or exercise.  Try a relaxation exercise to calm you down and decrease your stress. Remember, you may be tense and nervous for the first 2 weeks after you quit, but this will pass.  Find new activities to keep your hands busy. Play with a pen, coin, or rubber band. Doodle or draw things on paper.  Brush your teeth right after eating. This will help cut down on the craving for the taste of tobacco after meals. You can also try mouthwash.   Use oral substitutes in place of cigarettes. Try using lemon drops, carrots, cinnamon sticks, or chewing gum. Keep them handy so they are available when you have the urge to smoke.  When you have the urge to smoke, try deep breathing.  Designate your home as a nonsmoking area.  If you are a heavy smoker, ask your health care provider about a prescription for nicotine chewing gum. It can ease your withdrawal from nicotine.  Reward yourself. Set aside the cigarette money you save and buy yourself something nice.  Look for support from others. Join a support group or smoking cessation program. Ask someone at home or at work to help you with your plan  to quit smoking.  Always ask yourself, "Do I need this cigarette or is this just a reflex?" Tell yourself, "Today, I choose not to smoke," or "I do not want to smoke." You are reminding yourself of your decision to quit.  Do not replace cigarette smoking with electronic cigarettes (commonly called e-cigarettes). The safety of e-cigarettes is unknown, and some may contain harmful chemicals.  If you relapse, do not give up! Plan ahead and think about what you will do the next time you get the urge to smoke. HOW WILL I FEEL WHEN I QUIT SMOKING? You  may have symptoms of withdrawal because your body is used to nicotine (the addictive substance in cigarettes). You may crave cigarettes, be irritable, feel very hungry, cough often, get headaches, or have difficulty concentrating. The withdrawal symptoms are only temporary. They are strongest when you first quit but will go away within 10-14 days. When withdrawal symptoms occur, stay in control. Think about your reasons for quitting. Remind yourself that these are signs that your body is healing and getting used to being without cigarettes. Remember that withdrawal symptoms are easier to treat than the major diseases that smoking can cause.  Even after the withdrawal is over, expect periodic urges to smoke. However, these cravings are generally short lived and will go away whether you smoke or not. Do not smoke! WHAT RESOURCES ARE AVAILABLE TO HELP ME QUIT SMOKING? Your health care provider can direct you to community resources or hospitals for support, which may include:  Group support.  Education.  Hypnosis.  Therapy.   This information is not intended to replace advice given to you by your health care provider. Make sure you discuss any questions you have with your health care provider.   Document Released: 12/18/2003 Document Revised: 04/11/2014 Document Reviewed: 09/06/2012 Elsevier Interactive Patient Education [employer].

## 2015-12-15 ENCOUNTER — Telehealth (HOSPITAL_COMMUNITY): Payer: Self-pay | Admitting: Surgery

## 2015-12-15 NOTE — Telephone Encounter (Signed)
[name] HF Community Paramedic met with [name] today in her home.  The decision was made that she has made significant improvements and will be discharged from the program.  The patient will continue to follow in the [facility].

## 2015-12-18 IMAGING — CR DG CHEST 2V
2 series · 2 of 2 positions shown · non-contrast
Comparison: March 12, 2013

CLINICAL DATA: Chest pain and shortness of breath

EXAM:
CHEST  2 VIEW

[w chest pa]
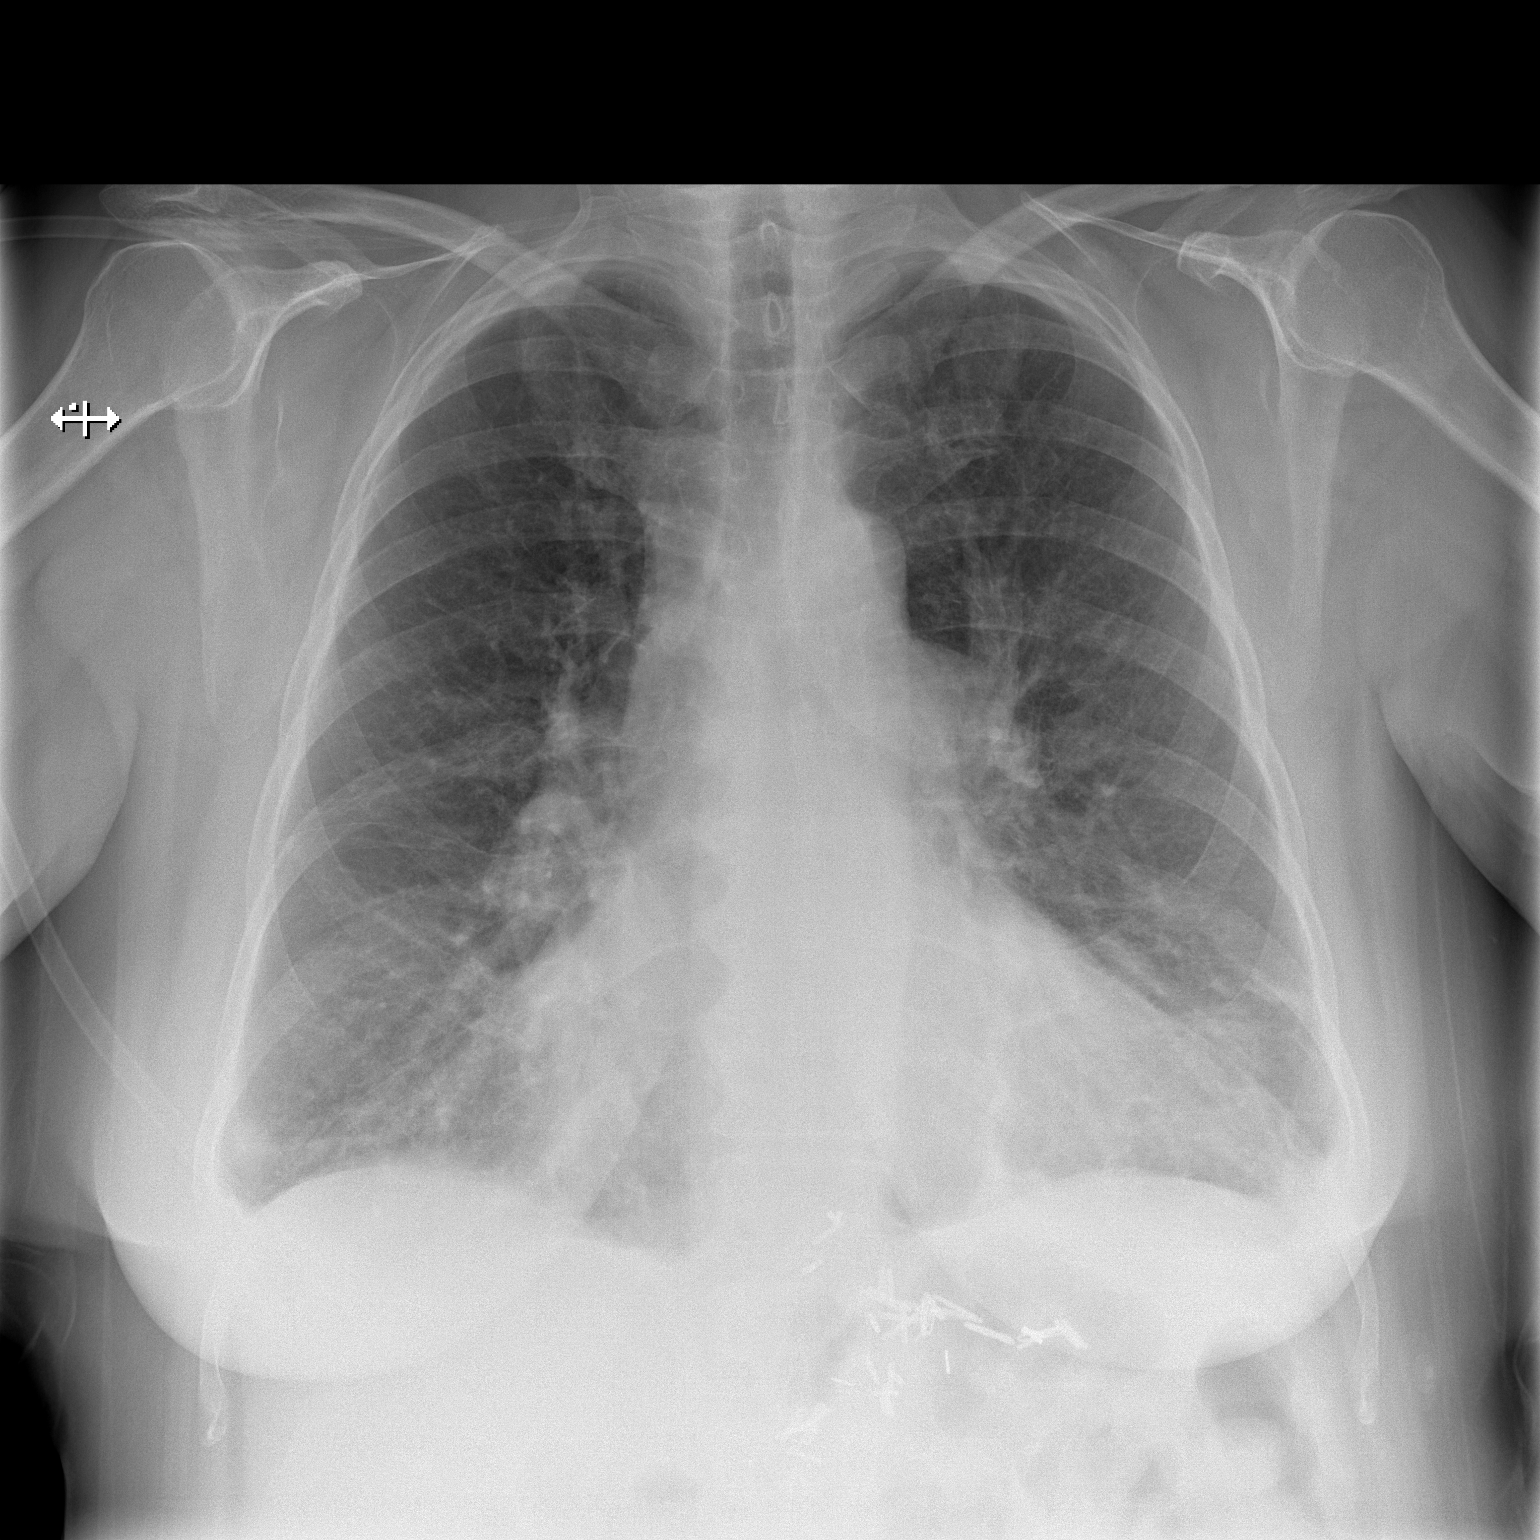

[w chest lat]
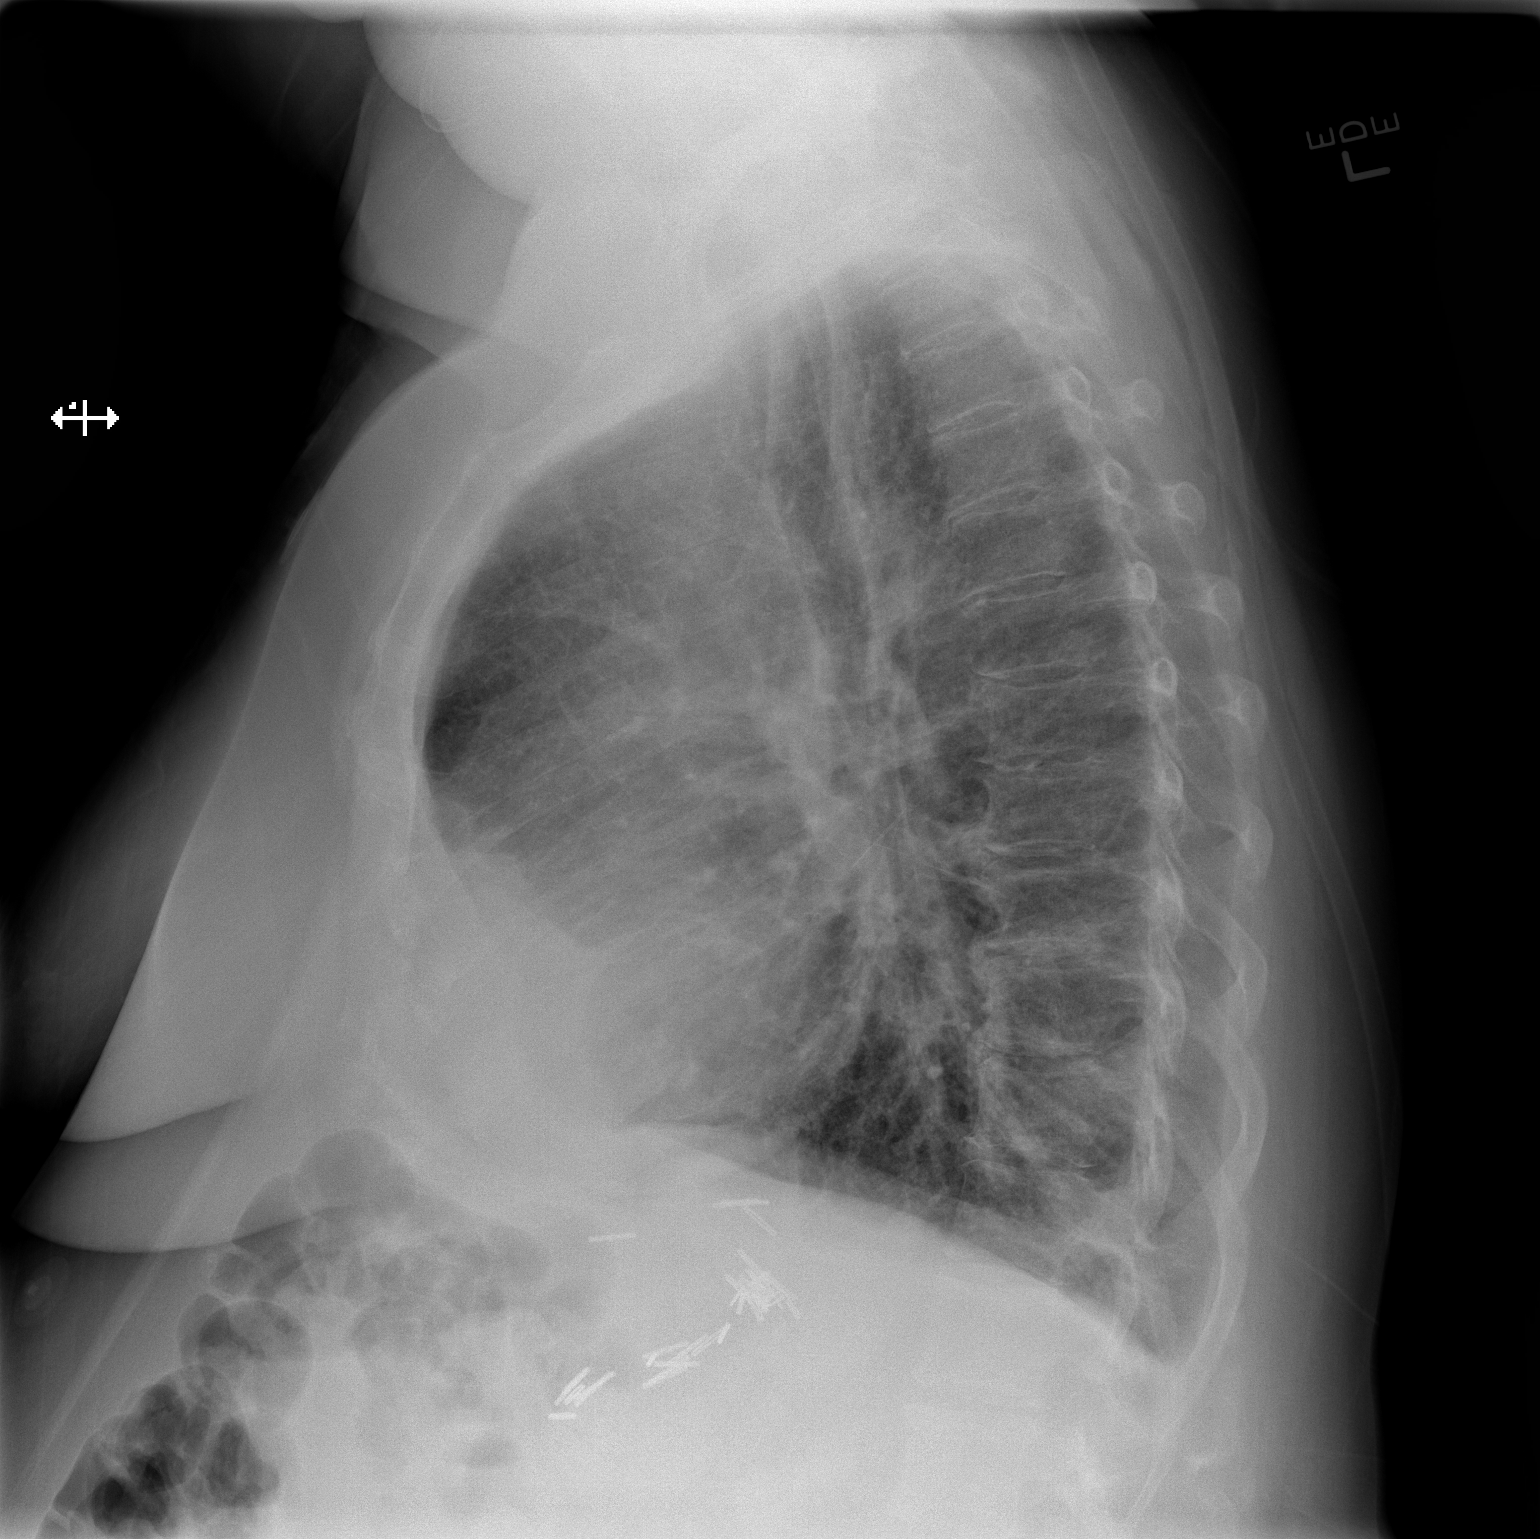

[2 of 2 positions shown; findings below may reference images not displayed]

FINDINGS: There is a small area of patchy infiltrate in the lateral left base.
There is no frank edema or consolidation elsewhere. There is an
apparent nipple shadow on the right. Heart is enlarged with
pulmonary vascularity within normal limits. No adenopathy. No bone
lesions. There is degenerative change in the thoracic spine. There
are surgical clips in the upper abdomen.
IMPRESSION: Small area of patchy infiltrate left base. Probable nipple shadow on
the right. Repeat study with nipple markers to confirm that this
opacity indeed represents a nipple shadow would be advisable.

Stable cardiomegaly.

## 2015-12-19 IMAGING — CR DG CHEST 2V
2 series · 2 of 2 positions shown · non-contrast
Comparison: DG CHEST 2 VIEW dated 05/13/2013

CLINICAL DATA: Short of breath.  Right-sided chest pain.

EXAM:
CHEST  2 VIEW

[w chest pa]
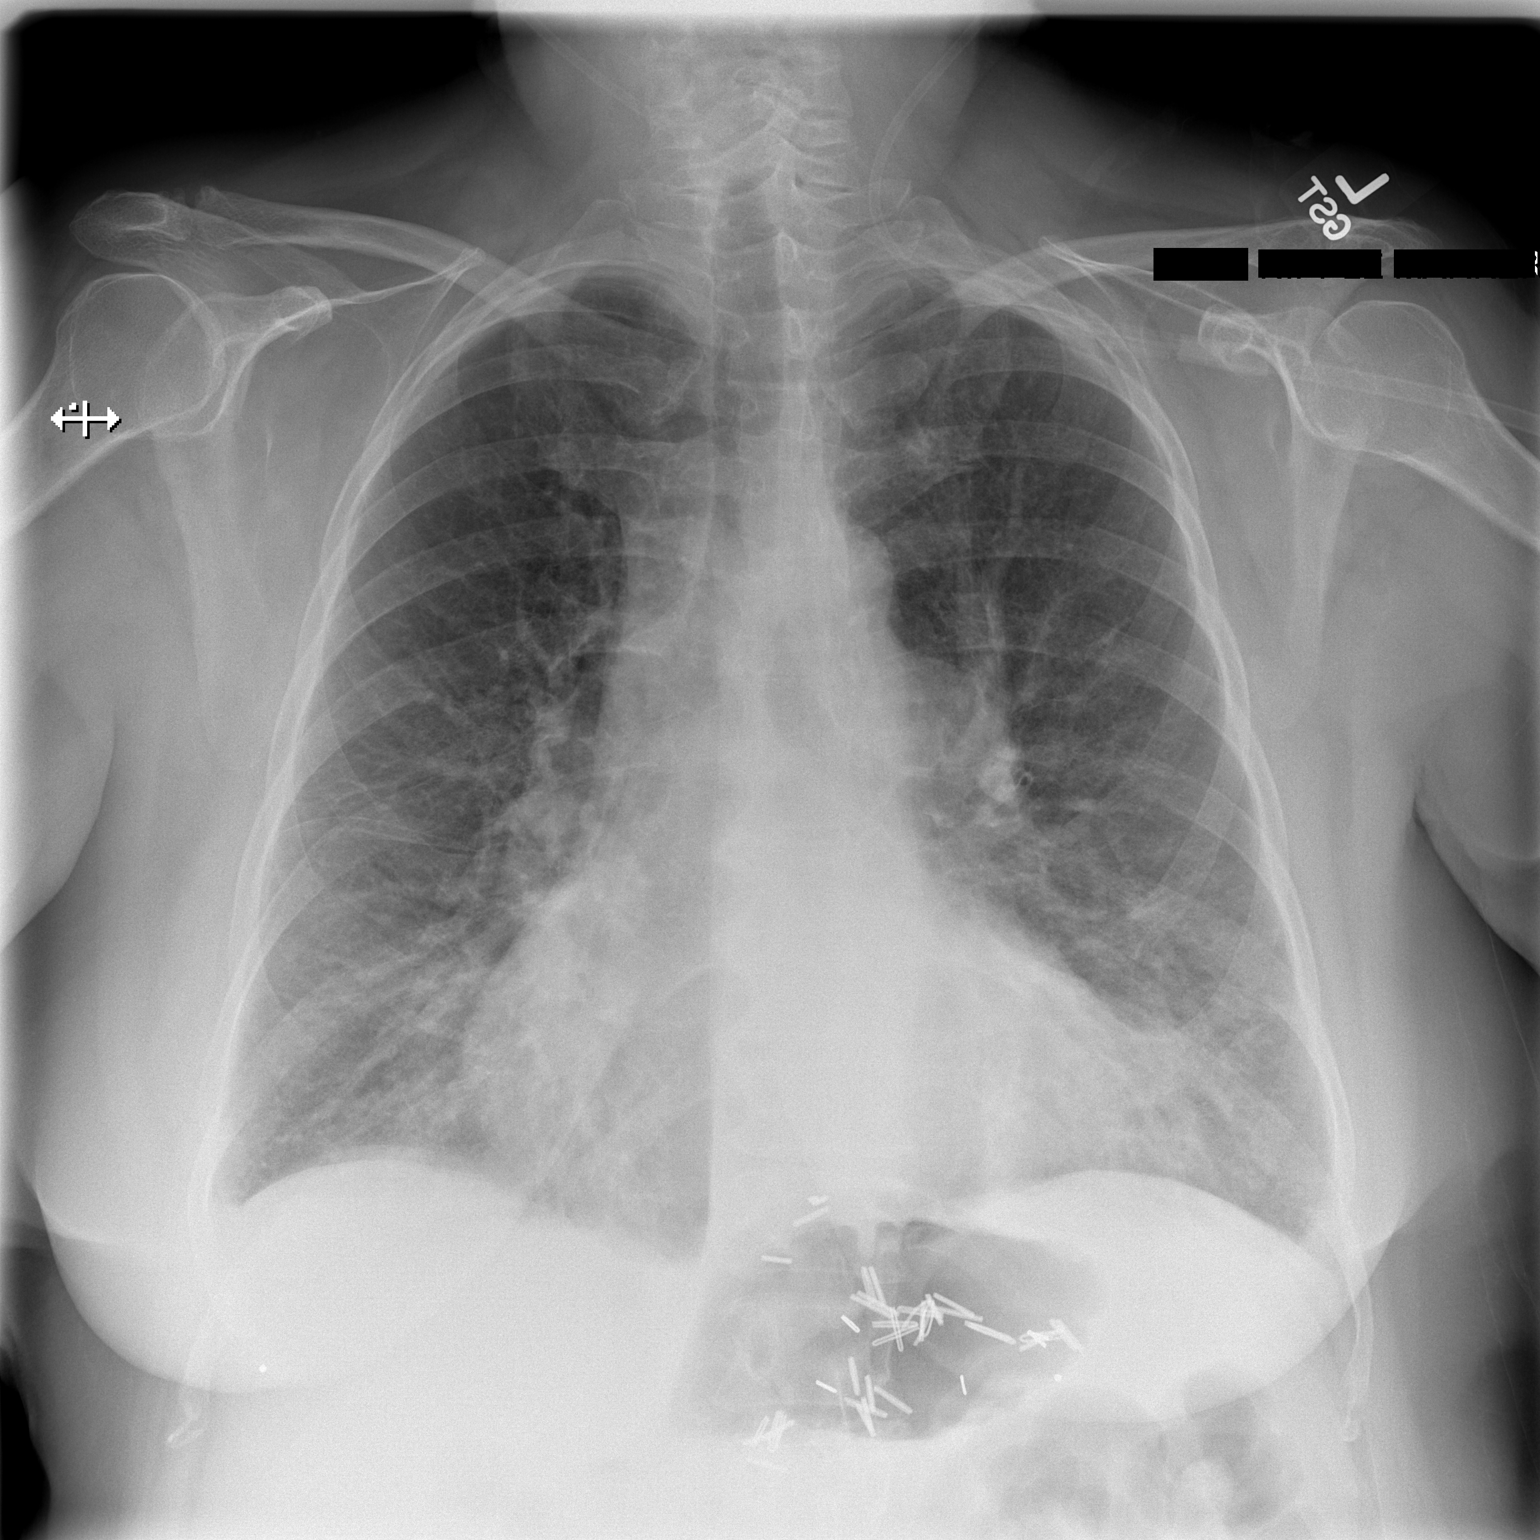

[w chest lat]
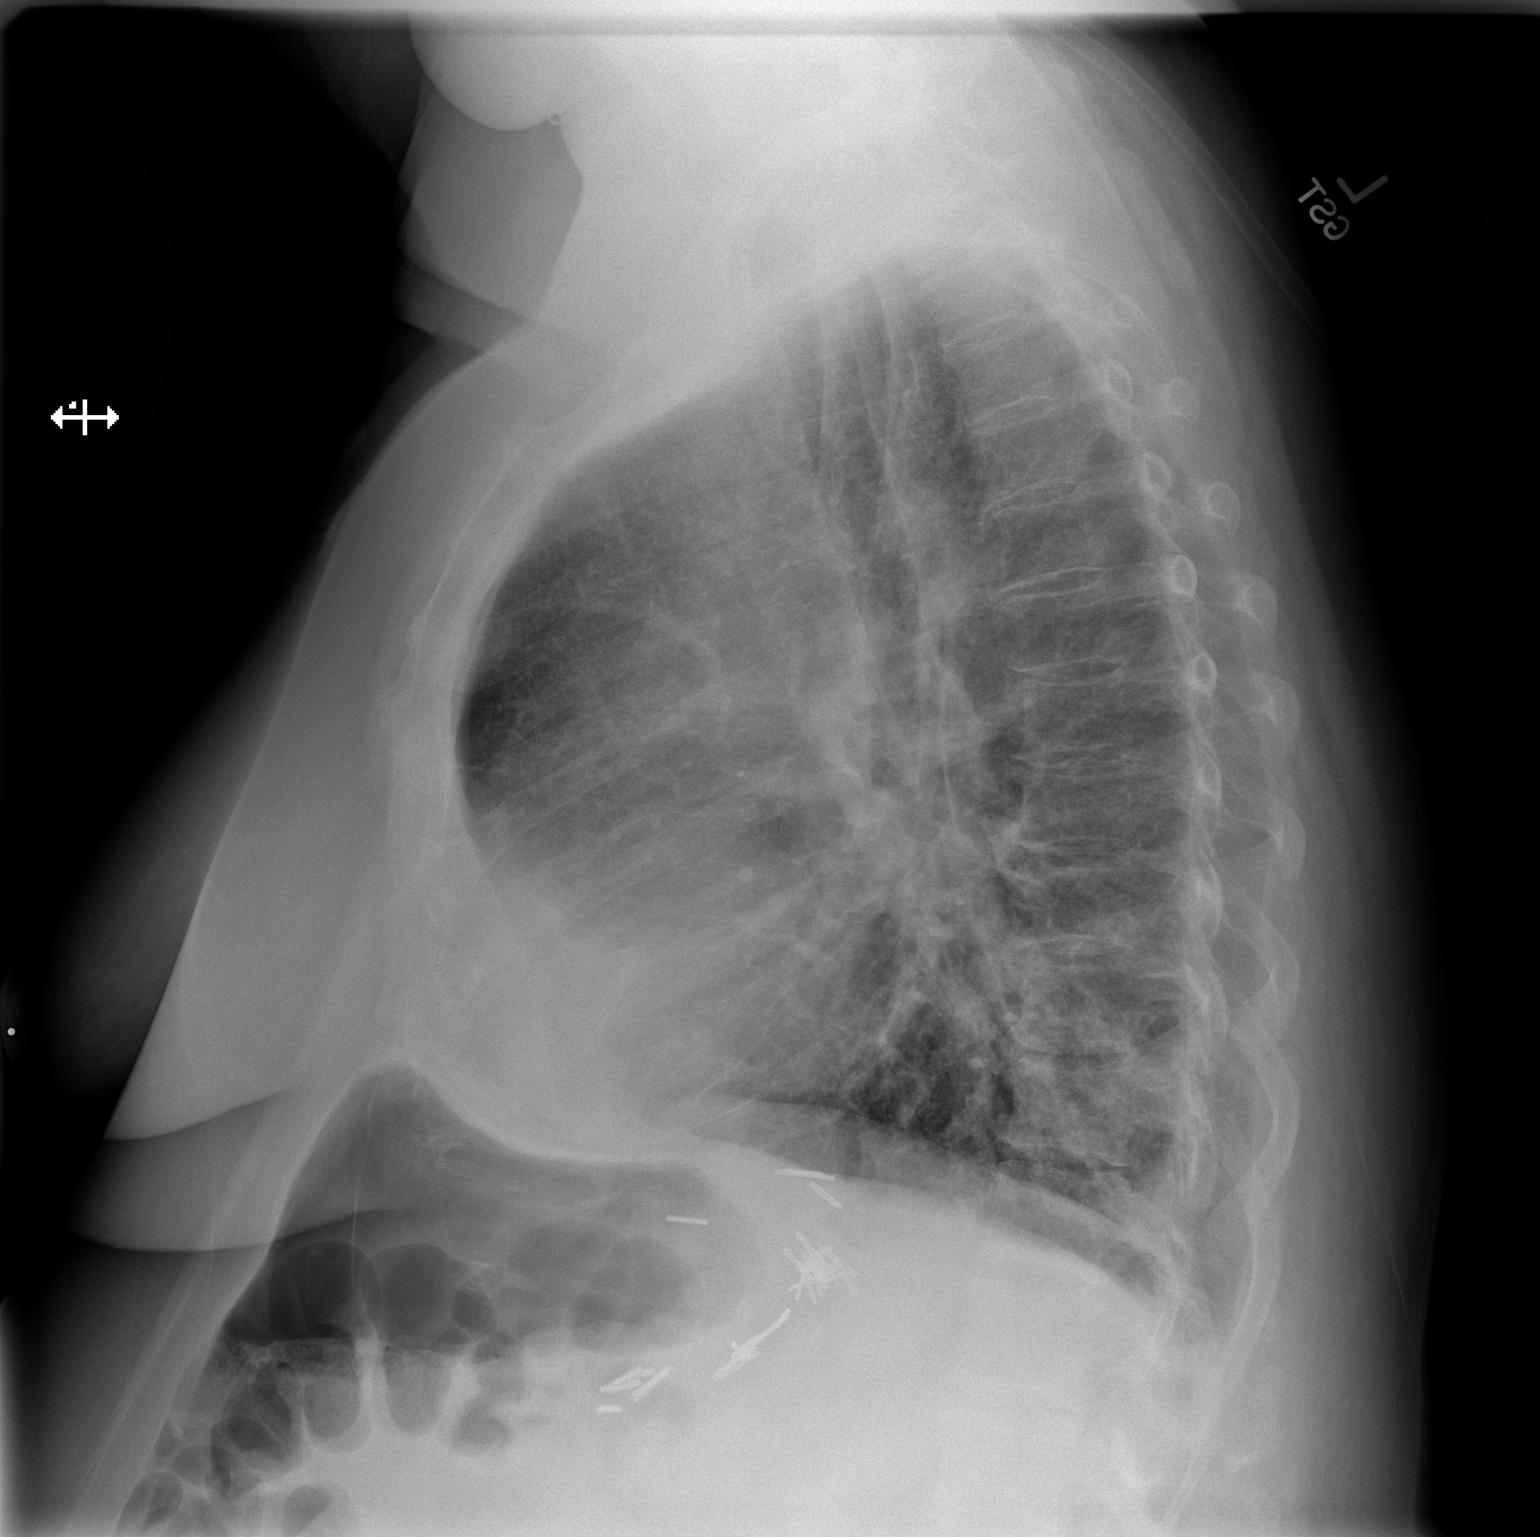

[2 of 2 positions shown; findings below may reference images not displayed]

FINDINGS: There is no interval change compared to the recent prior
examination. Cardiomegaly and bilateral basilar predominant
interstitial and alveolar opacity is present, most compatible with
CHF. Blunting of both costophrenic angles is present compatible with
tiny pleural effusions. Enlargement of the pulmonary arteries
suggesting pulmonary arterial hypertension.

The study has nipple markers. There are no pulmonary nodules
identified.
IMPRESSION: No interval change in the chest. Findings remain compatible with
CHF. No discrete pulmonary nodules identified.

## 2015-12-22 ENCOUNTER — Telehealth: Payer: Self-pay | Admitting: Internal Medicine

## 2015-12-22 NOTE — Telephone Encounter (Signed)
Appointment conf with patient per 12/02/15 los.

## 2015-12-23 ENCOUNTER — Encounter (HOSPITAL_COMMUNITY): Payer: Self-pay | Admitting: Internal Medicine

## 2015-12-23 ENCOUNTER — Ambulatory Visit (HOSPITAL_COMMUNITY)
Admission: RE | Admit: 2015-12-23 | Discharge: 2015-12-23 | Disposition: A | Discharge status: Home / Self Care | Payer: Commercial Managed Care - HMO | Source: Ambulatory Visit | Attending: Internal Medicine | Admitting: Internal Medicine

## 2015-12-23 VITALS — BP 124/80 | HR 86 | Wt 230.5 lb

## 2015-12-23 DIAGNOSIS — Z7984 Long term (current) use of oral hypoglycemic drugs: Secondary | ICD-10-CM | POA: Insufficient documentation

## 2015-12-23 DIAGNOSIS — Z79899 Other long term (current) drug therapy: Secondary | ICD-10-CM | POA: Diagnosis not present

## 2015-12-23 DIAGNOSIS — I2510 Atherosclerotic heart disease of native coronary artery without angina pectoris: Secondary | ICD-10-CM | POA: Diagnosis not present

## 2015-12-23 DIAGNOSIS — E669 Obesity, unspecified: Secondary | ICD-10-CM | POA: Diagnosis not present

## 2015-12-23 DIAGNOSIS — F17210 Nicotine dependence, cigarettes, uncomplicated: Secondary | ICD-10-CM | POA: Insufficient documentation

## 2015-12-23 DIAGNOSIS — J449 Chronic obstructive pulmonary disease, unspecified: Secondary | ICD-10-CM | POA: Insufficient documentation

## 2015-12-23 DIAGNOSIS — F40240 Claustrophobia: Secondary | ICD-10-CM | POA: Diagnosis not present

## 2015-12-23 DIAGNOSIS — I5032 Chronic diastolic (congestive) heart failure: Secondary | ICD-10-CM

## 2015-12-23 DIAGNOSIS — R0683 Snoring: Secondary | ICD-10-CM | POA: Diagnosis not present

## 2015-12-23 DIAGNOSIS — C3492 Malignant neoplasm of unspecified part of left bronchus or lung: Secondary | ICD-10-CM | POA: Insufficient documentation

## 2015-12-23 DIAGNOSIS — Z955 Presence of coronary angioplasty implant and graft: Secondary | ICD-10-CM | POA: Diagnosis not present

## 2015-12-23 DIAGNOSIS — E119 Type 2 diabetes mellitus without complications: Secondary | ICD-10-CM | POA: Insufficient documentation

## 2015-12-23 DIAGNOSIS — Z7982 Long term (current) use of aspirin: Secondary | ICD-10-CM | POA: Insufficient documentation

## 2015-12-23 LAB — BASIC METABOLIC PANEL
ANION GAP: 10 (ref 5–15)
BUN: 12 mg/dL (ref 6–20)
CALCIUM: 9.2 mg/dL (ref 8.9–10.3)
CO2: 31 mmol/L (ref 22–32)
Chloride: 95 mmol/L — ABNORMAL LOW (ref 101–111)
Creatinine, Ser: 1.33 mg/dL — ABNORMAL HIGH (ref 0.44–1.00)
GFR, EST AFRICAN AMERICAN: 49 mL/min — AB (ref >60)
GFR, EST NON AFRICAN AMERICAN: 42 mL/min — AB (ref >60)
Glucose, Bld: 98 mg/dL (ref 65–99)
Potassium: 3.9 mmol/L (ref 3.5–5.1)
SODIUM: 136 mmol/L (ref 135–145)

## 2015-12-23 LAB — BRAIN NATRIURETIC PEPTIDE: B NATRIURETIC PEPTIDE 5: 33.5 pg/mL (ref 0.0–100.0)

## 2015-12-23 NOTE — Addendum Note (Signed)
Encounter addended by: Harvie Junior, CMA on: 12/23/2015 12:05 PM<BR>    Actions taken: Vitals modified

## 2015-12-23 NOTE — Addendum Note (Signed)
Encounter addended by: Harvie Junior, CMA on: 12/23/2015 11:53 AM<BR>    Actions taken: Order Entry activity accessed, Diagnosis association updated, Sign clinical note

## 2015-12-23 NOTE — Patient Instructions (Addendum)
Routine lab work today. Will notify you of abnormal results  Follow up with Dr.Bensimhon in 4 months.

## 2015-12-23 NOTE — Addendum Note (Signed)
Encounter addended by: Harvie Junior, CMA on: 12/23/2015 12:15 PM<BR>    Actions taken: Sign clinical note

## 2015-12-23 NOTE — Progress Notes (Signed)
Patient ID: [name], female   DOB: 1956/01/30, 60 y.o.   MRN: [id number] Patient ID: [name], female   DOB: 1955/06/15, 60 y.o.   MRN: [id number]    Advanced Heart Failure Clinic Note   Referring Physician: Eppie Gibson Primary Care: Dr Oval Linsey Primary Cardiologist: Previously Dr. Percival Spanish  HPI: [name] is a 60 y.o. female with history of CAD s/p PCI 2010, pulmonary HTN, COPD on 4L via [place] chronically, Chronic diastolic CHF ECHO 11/2503 EF 50-55%, grade 1 DD, obesity, and primary adenocarcinoma of left lung who presents to establish in the HF clinic.   Has had 6 hospital admits in last 6 months for HF and related complaints.   Last amitted 05/04/15 - 05/06/15 for peripheral edema though BNP and CXR not suggestive of acute exacerbation. 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 Discharged with [facility] boots and HH.    She returns today for routine f/u. She is no longer being followed by [facility]. Feels stable overall. Weight mostly stable. Goes up and down. Now taking lasix 80 daily. If weight goes up takes lasix int he afternoon as well. If that fails will take metolazone. Has not had to take one since early August. Stopped smoking for 3 weeks with Wellbutrin. Gets HAs. Occasionally dizzy - worse when standing. Dr. Eppie Gibson treating for vertigo.   Labs 04/16/15   K 4.0, Creatinine 0.71 Labs 05/06/15 K 4.2, Creatinine 0.88 Labs 11/25/15 K 4.4 creatinine 1.6  Echo 05/2014 EF 50-55%, grade 1 DD, mild LAE, mild RAE, Normal RV  PFTs 12/02/14 FVC 1.58 (48%) FEV1 1.09 (42%) DLCO 7.91 (32%)    Past Medical History:  Diagnosis Date  . Arthritis    "some scattered" (03/03/2015)  . CHF (congestive heart failure) ([place])   . Chronic bronchitis ([place])    "get it most years" (03/03/2015)  . COPD (chronic obstructive pulmonary disease) ([facility])    Severe. Gold Stage IV.  PFTs (12/2008) - severe obstructive airway disease. Active tobacco use. Requires 4L O2  at home.  . Coronary artery disease    S/P PCI of LAD with DES (12/2008). Total occlusion of RCA noted at that time., medically managed. ACS ruled out 03/2009 with Lexiscan myoview . Followed by [facility].  . Depression   . Diastolic dysfunction    2-D Echo (12/2008) - Normal LV Systolic funciton with EF 60-65%. Grade 1 diastolid dysfunction. No regional wall motion abnormalities. Moderate pulmonary HTN with PA peak pressure 76mHg.  . Full dentures   . GERD (gastroesophageal reflux disease)    S/P Nissen fundoplication.  .Marland KitchenHistory of hiatal hernia   . Hx MRSA infection    Recurrent MRSA thigh abscesses.  . Hyperlipidemia   . Lung cancer ([facility]    "left"  . Obesity   . On home oxygen therapy since 2010   4L all the time  . Pneumonia   . Prediabetes    HgbA1c 6.4 (12/2008)  . [place]monary [place]    2-D Echo (039/7673 - Systolic pressure was moderately increased. PA peak pressure  510mg. 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 Followed by Dr. WrJoya GaskinsLVelora Heckler . Pulmonary nodule, right    Small right middle lobe nodule. Stable as of 12/2008.  . Marland Kitchenhortness of breath dyspnea    with a lot of exertion; if fluid builds up    Current  Outpatient Prescriptions  Medication Sig Dispense Refill  . albuterol (PROVENTIL) (2.5 MG/3ML) 0.083% nebulizer solution Take 3 mLs (2.5 mg total) by nebulization every 6 (six) hours as needed for shortness of breath. 360 mL 3  . albuterol (VENTOLIN HFA) 108 (90 BASE) MCG/ACT inhaler Inhale 2 puffs into the lungs every 6 (six) hours as needed for shortness of breath. 18 g 11  . aspirin 81 MG chewable tablet Chew 1 tablet (81 mg total) by mouth at bedtime. 90 tablet 3  . Aspirin-Salicylamide-Caffeine (BC HEADACHE POWDER PO) Take 1 packet by mouth 3 (three) times daily as needed (pain).    Marland Kitchen buPROPion (WELLBUTRIN SR) 150 MG 12 hr tablet Take 1 tablet  (150 mg total) by mouth 2 (two) times daily. Start with 150 mg once daily for 3 days 180 tablet 0  . Fluticasone-Salmeterol (ADVAIR DISKUS) 250-50 MCG/DOSE AEPB Inhale 1 puff into the lungs 2 (two) times daily. 60 each 11  . furosemide (LASIX) 40 MG tablet Take 80 mg by mouth daily.    Marland Kitchen gabapentin (NEURONTIN) 300 MG capsule Take 300 mg by mouth at bedtime.    Marland Kitchen lisinopril (PRINIVIL,ZESTRIL) 5 MG tablet Take 1 tablet (5 mg total) by mouth at bedtime. 90 tablet 3  . metFORMIN (GLUCOPHAGE) 500 MG tablet Take 1 tablet (500 mg total) by mouth 2 (two) times daily with a meal. 180 tablet 3  . pantoprazole (PROTONIX) 40 MG tablet Take 1 tablet (40 mg total) by mouth daily. 90 tablet 3  . potassium chloride SA (K-DUR,KLOR-CON) 20 MEQ tablet Take 1 tablet (20 mEq total) by mouth daily. 90 tablet 3  . rosuvastatin (CRESTOR) 20 MG tablet Take 1 tablet (20 mg total) by mouth daily. 90 tablet 3  . tiotropium (SPIRIVA HANDIHALER) 18 MCG inhalation capsule Place 1 capsule (18 mcg total) into inhaler and inhale daily. 90 capsule 3  . vitamin B-12 (CYANOCOBALAMIN) 1000 MCG tablet Take 1 tablet (1,000 mcg total) by mouth daily. 90 tablet 3  . metolazone (ZAROXOLYN) 2.5 MG tablet TAKE 1 TABLET BY MOUTH AS NEEDED(FOR 5 LBS WEIGHT GAIN) (Patient not taking: Reported on 12/23/2015) 90 tablet 3  . OXYGEN Inhale 4 L into the lungs continuous.     No current facility-administered medications for this encounter.     Allergies  Allergen Reactions  . Fluconazole Anaphylaxis, Itching and Swelling  . Atorvastatin Other (See Comments)    Dizziness after med started and resolved per pt after med stopped. September 08 2014      Social History   Social History  . Marital status: Divorced    Spouse name: N/A  . Number of children: N/A  . Years of education: N/A   Occupational History  . Not on file.   Social History Main Topics  . Smoking status: Current Every Day Smoker    Packs/day: 1.00    Years: 45.00    Types:  Cigarettes    Start date: 04/04/1969  . Smokeless tobacco: Never Used     Comment: 1 cig per day  . Alcohol use No  . Drug use: No  . Sexual activity: Not Currently    Birth control/ protection: None   Other Topics Concern  . Not on file   Social History Narrative   Formerly worked as a Scientist, water quality, now disabled.   Divorced.   2 grown children.   Lives with her grandson.      Family History  Problem Relation Age of Onset  . Heart disease Mother  25    Deceased from MI at 18yo  . Hypertension Mother   . Heart disease Father 10    Deceased of MI age 44yo  . Hypertension Father   . Hypertension Brother   . Lung cancer      Grandmother    Vitals:   12/23/15 1128  BP: 124/80  Pulse: 86  SpO2: 96%  Weight: 230 lb 8 oz (104.6 kg)   Wt Readings from Last 3 Encounters:  12/23/15 230 lb 8 oz (104.6 kg)  12/02/15 238 lb 9.6 oz (108.2 kg)  11/20/15 241 lb 1.6 oz (109.4 kg)     PHYSICAL EXAM: General: Sitting in chair wearing O2. NAD HEENT: normal Neck: supple. JVP  7-8, Carotids 2+ bilat; no bruits. No thyromegaly or nodule noted. Cor: PMI nondisplaced. RRR No rubs, gallops or murmurs. Lungs: Clear with decreased BS  Abdomen: Obese, soft, NT, ND, no HSM. No bruits or masses. +BS  Extremities: no cyanosis, clubbing, rash. Trace edema Neuro: alert & oriented x 3, cranial nerves grossly intact. moves all 4 extremities w/o difficulty. Affect pleasant.   ASSESSMENT & PLAN:  1. Chronic diastolic HF - ECHO 09/5679 EF 50-55%, Grade 1 DD - Volume status much improved. The question is might she be a little dry. Will check orthostatics and labs. - Will continue current regimen for now. - Will need repeat echo in 3-4 months.  2. CAD s/p PCI 2010 - No active symptoms.  - Continue ASA 81 mg and Lovastatin 40 mg daily.    2. COPD - on home 02 4L via [place] - congratulated on smoking cessation 3. Lung cancer, left - Stage IIA (T2b, N0, M0) non-small cell lung cancer, adenocarcinoma with  negative EGFR mutation and negative ALK gene translocation, diagnosed in August 2016 and presented with left lower lobe lung mass.  - Underwent radio-curative therapy. Poor resection candidate with lung disease.  4. Obesity - encouraged to watch portions and increase activity as able.  5. Diabetes - Hgb A1C 6.5 11/20/15, recently started metformin - Needs to follow up with IM 6. OSA/?OHS - Snores very loudly - She says she is claustrophobic and is very unlikely to wear a CPAP mask, wishes to hold off at this time.    Bensimhon, Daniel,MD 11:41 AM

## 2016-02-02 ENCOUNTER — Other Ambulatory Visit: Payer: Self-pay | Admitting: Internal Medicine

## 2016-02-02 DIAGNOSIS — E538 Deficiency of other specified B group vitamins: Secondary | ICD-10-CM

## 2016-02-02 DIAGNOSIS — J449 Chronic obstructive pulmonary disease, unspecified: Secondary | ICD-10-CM

## 2016-02-02 DIAGNOSIS — G63 Polyneuropathy in diseases classified elsewhere: Secondary | ICD-10-CM

## 2016-02-04 ENCOUNTER — Encounter (HOSPITAL_COMMUNITY): Payer: Self-pay | Admitting: *Deleted

## 2016-02-04 ENCOUNTER — Emergency Department (HOSPITAL_COMMUNITY): Payer: Commercial Managed Care - HMO

## 2016-02-04 ENCOUNTER — Inpatient Hospital Stay (HOSPITAL_COMMUNITY)
Admission: EM | Admit: 2016-02-04 | Discharge: 2016-02-07 | DRG: 191 | Disposition: A | Discharge status: Home / Self Care | Payer: Commercial Managed Care - HMO | Attending: Internal Medicine | Admitting: Internal Medicine

## 2016-02-04 DIAGNOSIS — I5032 Chronic diastolic (congestive) heart failure: Secondary | ICD-10-CM | POA: Diagnosis not present

## 2016-02-04 DIAGNOSIS — Z79899 Other long term (current) drug therapy: Secondary | ICD-10-CM | POA: Diagnosis not present

## 2016-02-04 DIAGNOSIS — Z87891 Personal history of nicotine dependence: Secondary | ICD-10-CM

## 2016-02-04 DIAGNOSIS — Z955 Presence of coronary angioplasty implant and graft: Secondary | ICD-10-CM

## 2016-02-04 DIAGNOSIS — K219 Gastro-esophageal reflux disease without esophagitis: Secondary | ICD-10-CM | POA: Diagnosis present

## 2016-02-04 DIAGNOSIS — E7800 Pure hypercholesterolemia, unspecified: Secondary | ICD-10-CM | POA: Diagnosis present

## 2016-02-04 DIAGNOSIS — E119 Type 2 diabetes mellitus without complications: Secondary | ICD-10-CM | POA: Diagnosis present

## 2016-02-04 DIAGNOSIS — E785 Hyperlipidemia, unspecified: Secondary | ICD-10-CM | POA: Diagnosis present

## 2016-02-04 DIAGNOSIS — N179 Acute kidney failure, unspecified: Secondary | ICD-10-CM | POA: Diagnosis not present

## 2016-02-04 DIAGNOSIS — E1122 Type 2 diabetes mellitus with diabetic chronic kidney disease: Secondary | ICD-10-CM | POA: Diagnosis not present

## 2016-02-04 DIAGNOSIS — Z7984 Long term (current) use of oral hypoglycemic drugs: Secondary | ICD-10-CM | POA: Diagnosis not present

## 2016-02-04 DIAGNOSIS — J90 Pleural effusion, not elsewhere classified: Secondary | ICD-10-CM | POA: Diagnosis not present

## 2016-02-04 DIAGNOSIS — J9610 Chronic respiratory failure, unspecified whether with hypoxia or hypercapnia: Secondary | ICD-10-CM | POA: Diagnosis present

## 2016-02-04 DIAGNOSIS — J441 Chronic obstructive pulmonary disease with (acute) exacerbation: Secondary | ICD-10-CM | POA: Diagnosis not present

## 2016-02-04 DIAGNOSIS — I2510 Atherosclerotic heart disease of native coronary artery without angina pectoris: Secondary | ICD-10-CM | POA: Diagnosis present

## 2016-02-04 DIAGNOSIS — J209 Acute bronchitis, unspecified: Secondary | ICD-10-CM | POA: Diagnosis present

## 2016-02-04 DIAGNOSIS — E559 Vitamin D deficiency, unspecified: Secondary | ICD-10-CM | POA: Diagnosis present

## 2016-02-04 DIAGNOSIS — R069 Unspecified abnormalities of breathing: Secondary | ICD-10-CM | POA: Diagnosis not present

## 2016-02-04 DIAGNOSIS — Z8249 Family history of ischemic heart disease and other diseases of the circulatory system: Secondary | ICD-10-CM | POA: Diagnosis not present

## 2016-02-04 DIAGNOSIS — Z8614 Personal history of Methicillin resistant Staphylococcus aureus infection: Secondary | ICD-10-CM

## 2016-02-04 DIAGNOSIS — N183 Chronic kidney disease, stage 3 unspecified: Secondary | ICD-10-CM

## 2016-02-04 DIAGNOSIS — R05 Cough: Secondary | ICD-10-CM | POA: Diagnosis not present

## 2016-02-04 DIAGNOSIS — R748 Abnormal levels of other serum enzymes: Secondary | ICD-10-CM | POA: Diagnosis present

## 2016-02-04 DIAGNOSIS — N184 Chronic kidney disease, stage 4 (severe): Secondary | ICD-10-CM | POA: Diagnosis present

## 2016-02-04 DIAGNOSIS — E860 Dehydration: Secondary | ICD-10-CM | POA: Diagnosis present

## 2016-02-04 DIAGNOSIS — R0602 Shortness of breath: Secondary | ICD-10-CM | POA: Diagnosis not present

## 2016-02-04 DIAGNOSIS — I5030 Unspecified diastolic (congestive) heart failure: Secondary | ICD-10-CM | POA: Diagnosis present

## 2016-02-04 DIAGNOSIS — I2729 Other secondary pulmonary hypertension: Secondary | ICD-10-CM | POA: Diagnosis not present

## 2016-02-04 DIAGNOSIS — J9611 Chronic respiratory failure with hypoxia: Secondary | ICD-10-CM | POA: Diagnosis not present

## 2016-02-04 DIAGNOSIS — Z7982 Long term (current) use of aspirin: Secondary | ICD-10-CM | POA: Diagnosis not present

## 2016-02-04 DIAGNOSIS — Z9981 Dependence on supplemental oxygen: Secondary | ICD-10-CM

## 2016-02-04 DIAGNOSIS — I130 Hypertensive heart and chronic kidney disease with heart failure and stage 1 through stage 4 chronic kidney disease, or unspecified chronic kidney disease: Secondary | ICD-10-CM | POA: Diagnosis present

## 2016-02-04 DIAGNOSIS — Z794 Long term (current) use of insulin: Secondary | ICD-10-CM

## 2016-02-04 DIAGNOSIS — J449 Chronic obstructive pulmonary disease, unspecified: Secondary | ICD-10-CM | POA: Diagnosis present

## 2016-02-04 DIAGNOSIS — C3492 Malignant neoplasm of unspecified part of left bronchus or lung: Secondary | ICD-10-CM | POA: Diagnosis present

## 2016-02-04 DIAGNOSIS — J948 Other specified pleural conditions: Secondary | ICD-10-CM | POA: Diagnosis not present

## 2016-02-04 DIAGNOSIS — J440 Chronic obstructive pulmonary disease with acute lower respiratory infection: Secondary | ICD-10-CM | POA: Diagnosis present

## 2016-02-04 DIAGNOSIS — Z9889 Other specified postprocedural states: Secondary | ICD-10-CM | POA: Diagnosis not present

## 2016-02-04 LAB — I-STAT TROPONIN, ED: TROPONIN I, POC: 0.01 ng/mL (ref 0.00–0.08)

## 2016-02-04 LAB — CBC WITH DIFFERENTIAL/PLATELET
BASOS PCT: 0 %
Basophils Absolute: 0 10*3/uL (ref 0.0–0.1)
EOS ABS: 0.6 10*3/uL (ref 0.0–0.7)
Eosinophils Relative: 5 %
HEMATOCRIT: 32.1 % — AB (ref 36.0–46.0)
HEMOGLOBIN: 10.3 g/dL — AB (ref 12.0–15.0)
LYMPHS ABS: 1.1 10*3/uL (ref 0.7–4.0)
Lymphocytes Relative: 10 %
MCH: 29.3 pg (ref 26.0–34.0)
MCHC: 32.1 g/dL (ref 30.0–36.0)
MCV: 91.2 fL (ref 78.0–100.0)
MONO ABS: 0.8 10*3/uL (ref 0.1–1.0)
MONOS PCT: 7 %
NEUTROS PCT: 78 %
Neutro Abs: 8.3 10*3/uL — ABNORMAL HIGH (ref 1.7–7.7)
Platelets: 294 10*3/uL (ref 150–400)
RBC: 3.52 MIL/uL — ABNORMAL LOW (ref 3.87–5.11)
RDW: 14.1 % (ref 11.5–15.5)
WBC: 10.7 10*3/uL — ABNORMAL HIGH (ref 4.0–10.5)

## 2016-02-04 LAB — BRAIN NATRIURETIC PEPTIDE: B Natriuretic Peptide: 20.7 pg/mL (ref 0.0–100.0)

## 2016-02-04 LAB — COMPREHENSIVE METABOLIC PANEL
ALBUMIN: 3.3 g/dL — AB (ref 3.5–5.0)
ALK PHOS: 134 U/L — AB (ref 38–126)
ALT: 8 U/L — ABNORMAL LOW (ref 14–54)
ANION GAP: 13 (ref 5–15)
AST: 12 U/L — ABNORMAL LOW (ref 15–41)
BILIRUBIN TOTAL: 0.6 mg/dL (ref 0.3–1.2)
BUN: 29 mg/dL — ABNORMAL HIGH (ref 6–20)
CALCIUM: 8.7 mg/dL — AB (ref 8.9–10.3)
CO2: 27 mmol/L (ref 22–32)
Chloride: 96 mmol/L — ABNORMAL LOW (ref 101–111)
Creatinine, Ser: 3.41 mg/dL — ABNORMAL HIGH (ref 0.44–1.00)
GFR calc non Af Amer: 14 mL/min — ABNORMAL LOW (ref >60)
GFR, EST AFRICAN AMERICAN: 16 mL/min — AB (ref >60)
Glucose, Bld: 108 mg/dL — ABNORMAL HIGH (ref 65–99)
POTASSIUM: 4 mmol/L (ref 3.5–5.1)
SODIUM: 136 mmol/L (ref 135–145)
TOTAL PROTEIN: 7.1 g/dL (ref 6.5–8.1)

## 2016-02-04 IMAGING — CR DG CHEST 2V
2 series · 2 of 2 positions shown · non-contrast
Comparison: 05/14/2013

CLINICAL DATA: Short of breath.

EXAM:
CHEST  2 VIEW

[w chest lat]
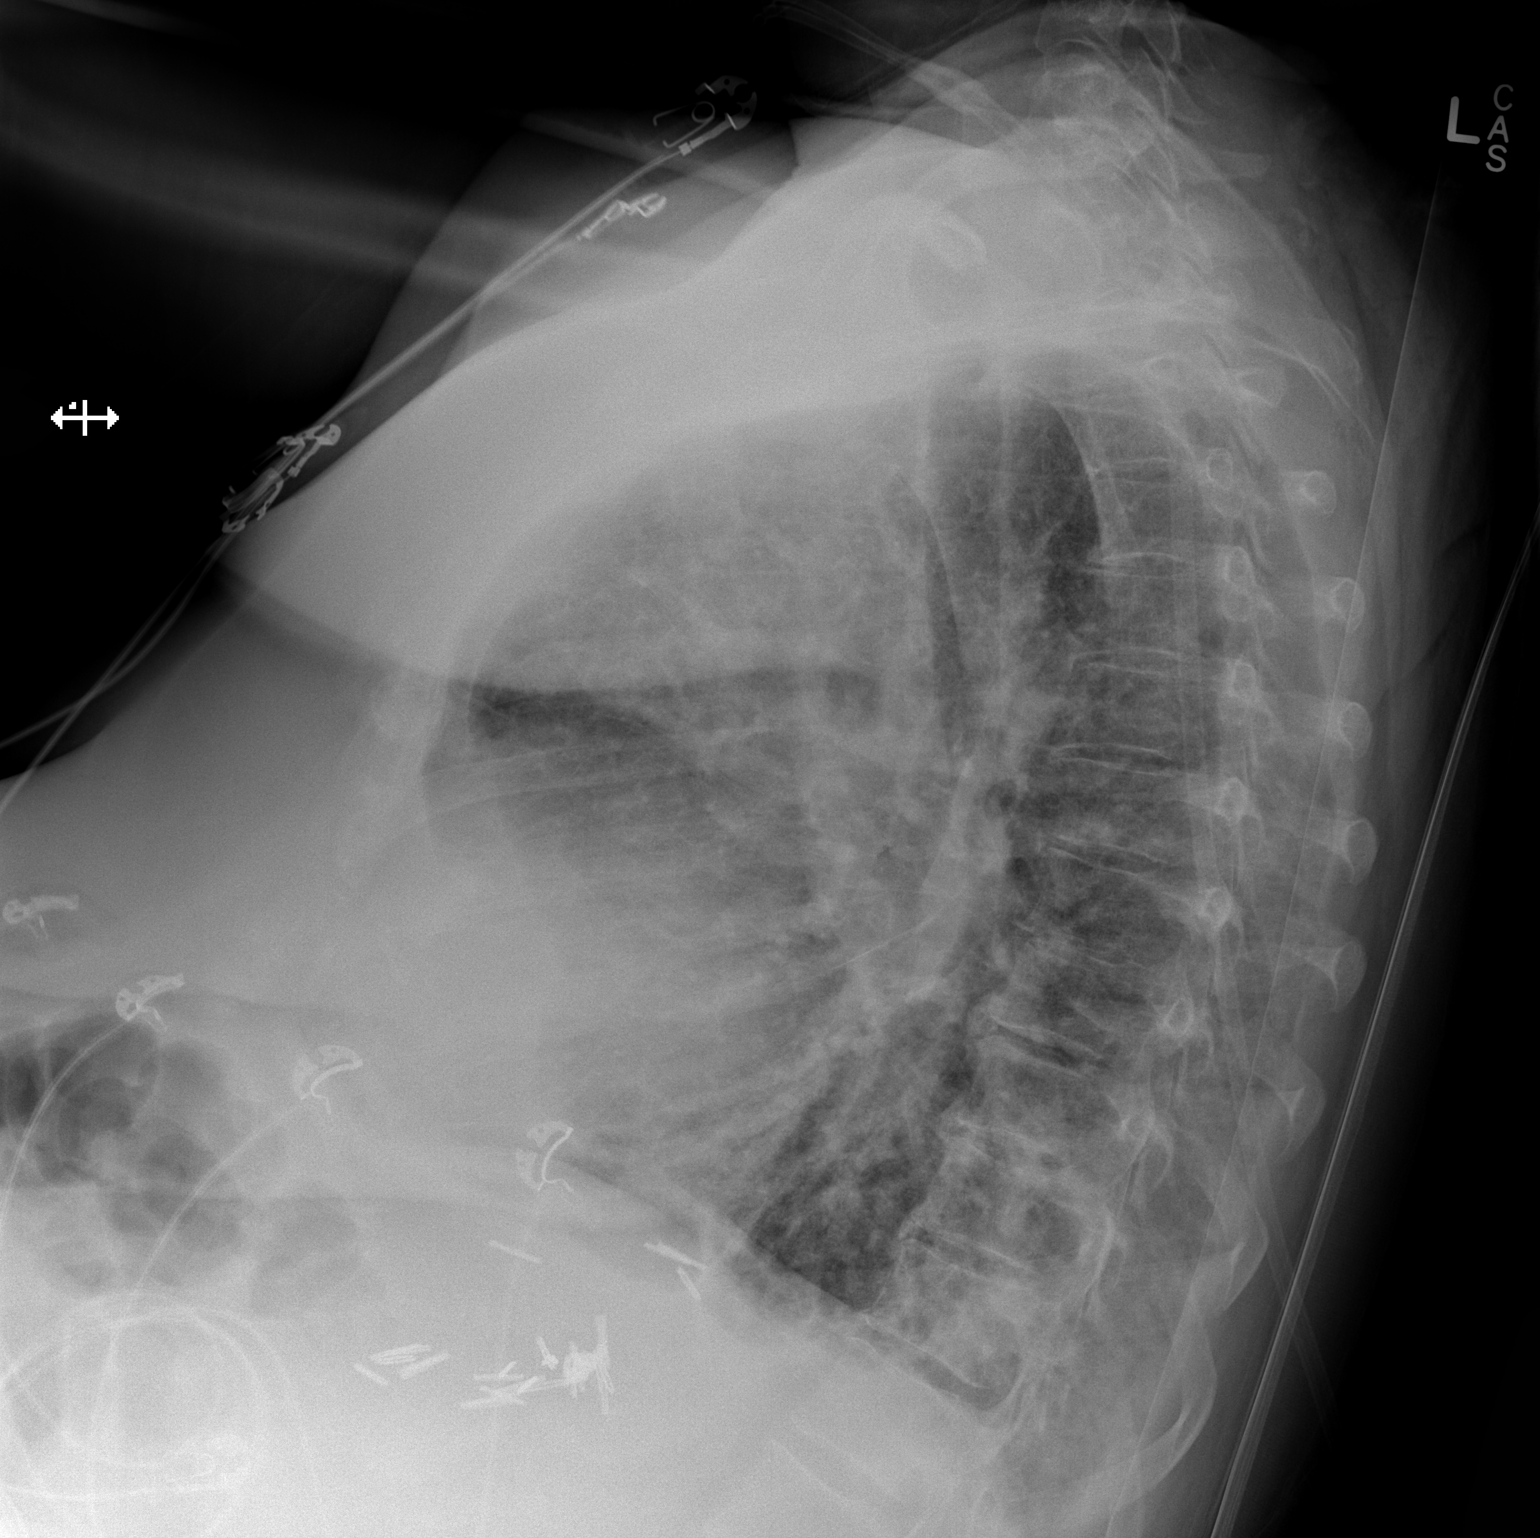

[x chest ap]
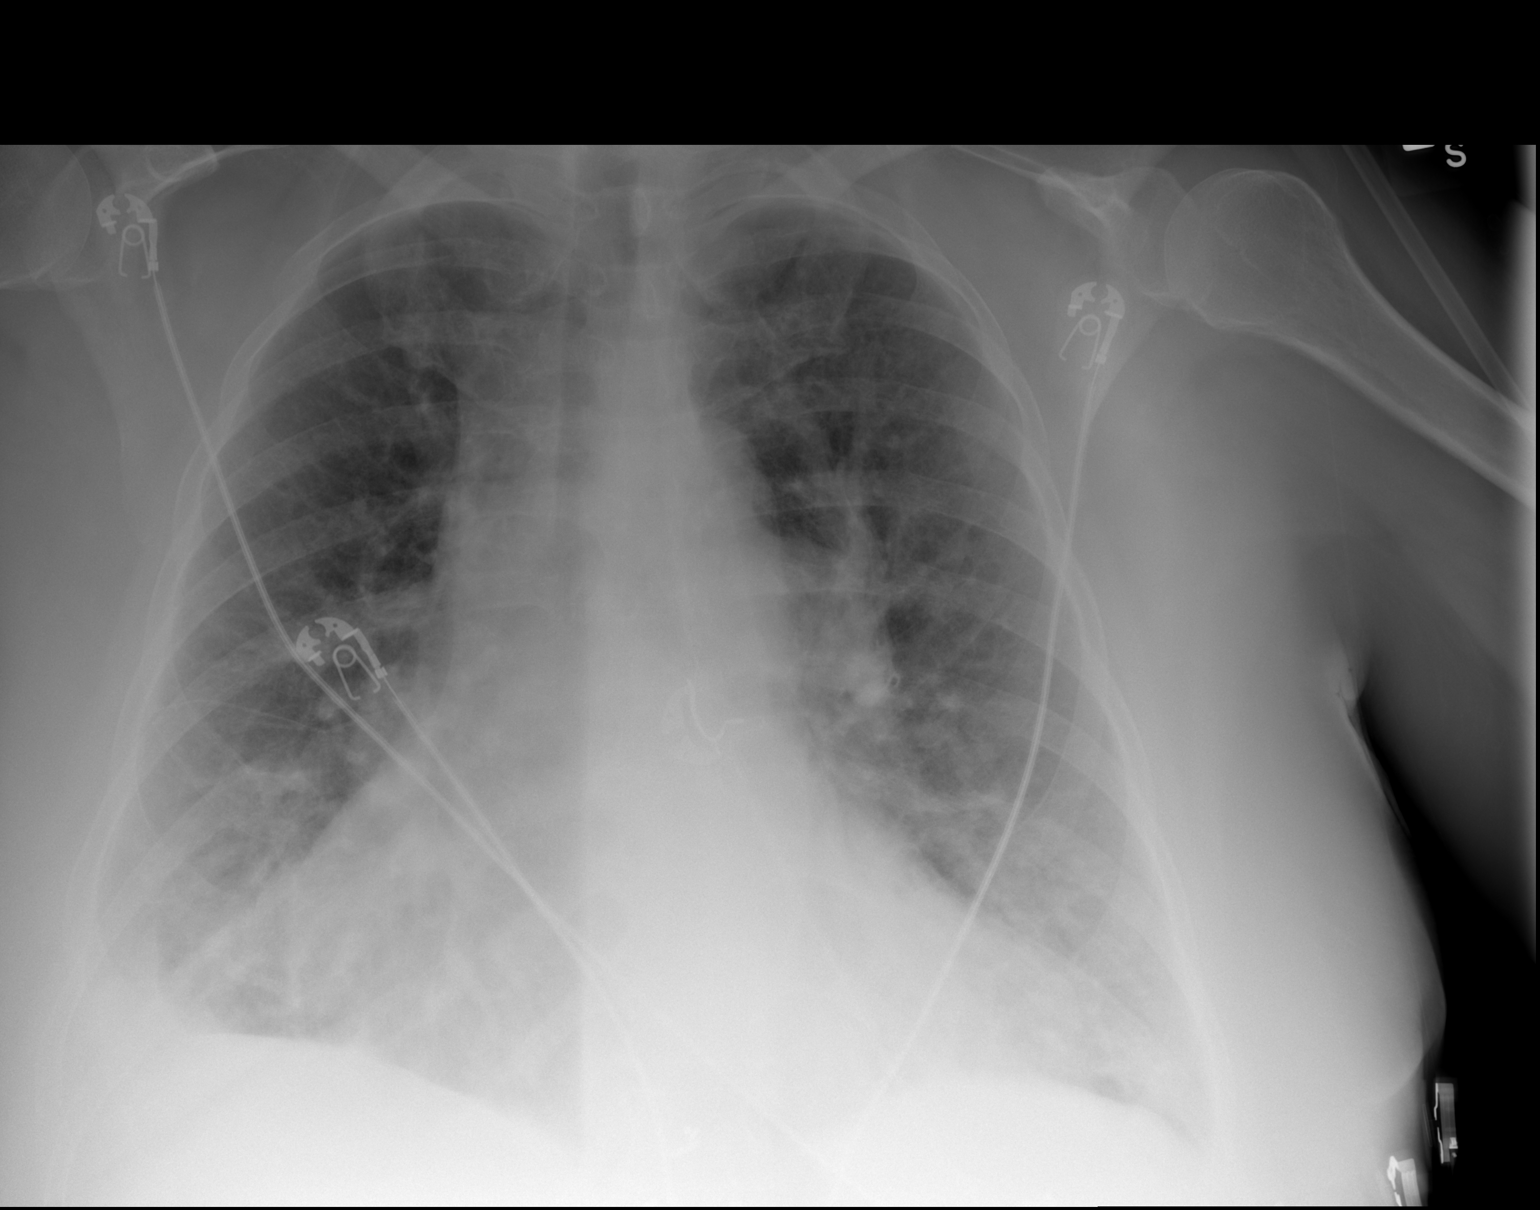

[2 of 2 positions shown; findings below may reference images not displayed]

FINDINGS: Cardiac silhouette is mildly enlarged. Normal mediastinal contours.
Stable prominent pulmonary arteries.

Lungs are hyperexpanded. There is increased opacity at the right
lung base when compared to the prior exam. This silhouettes the
portion of the right lateral posterior hemidiaphragm.

There are prominent bronchovascular markings similar to the prior
exam as well as mild bilateral interstitial thickening.

No pleural effusion or pneumothorax.

The bony thorax is demineralized but intact.
IMPRESSION: 1. New right lower lobe opacity. This suggests pneumonia in the
proper clinical setting. It could be due to atelectasis.
2. Lung hyperexpansion with prominent bronchovascular and mildly
thickened interstitial markings. These findings are stable from the
prior study.
3. Mild stable cardiomegaly.

## 2016-02-04 IMAGING — CR DG KNEE COMPLETE 4+V*R*
4 series · 4 of 4 positions shown · non-contrast
Comparison: Concurrently obtained radiographs of the contralateral
left knee. Prior right knee MRI 03/04/2006

CLINICAL DATA: Fall, bilateral knee pain

EXAM:
RIGHT KNEE - COMPLETE 4+ VIEW

[x knee ap right]
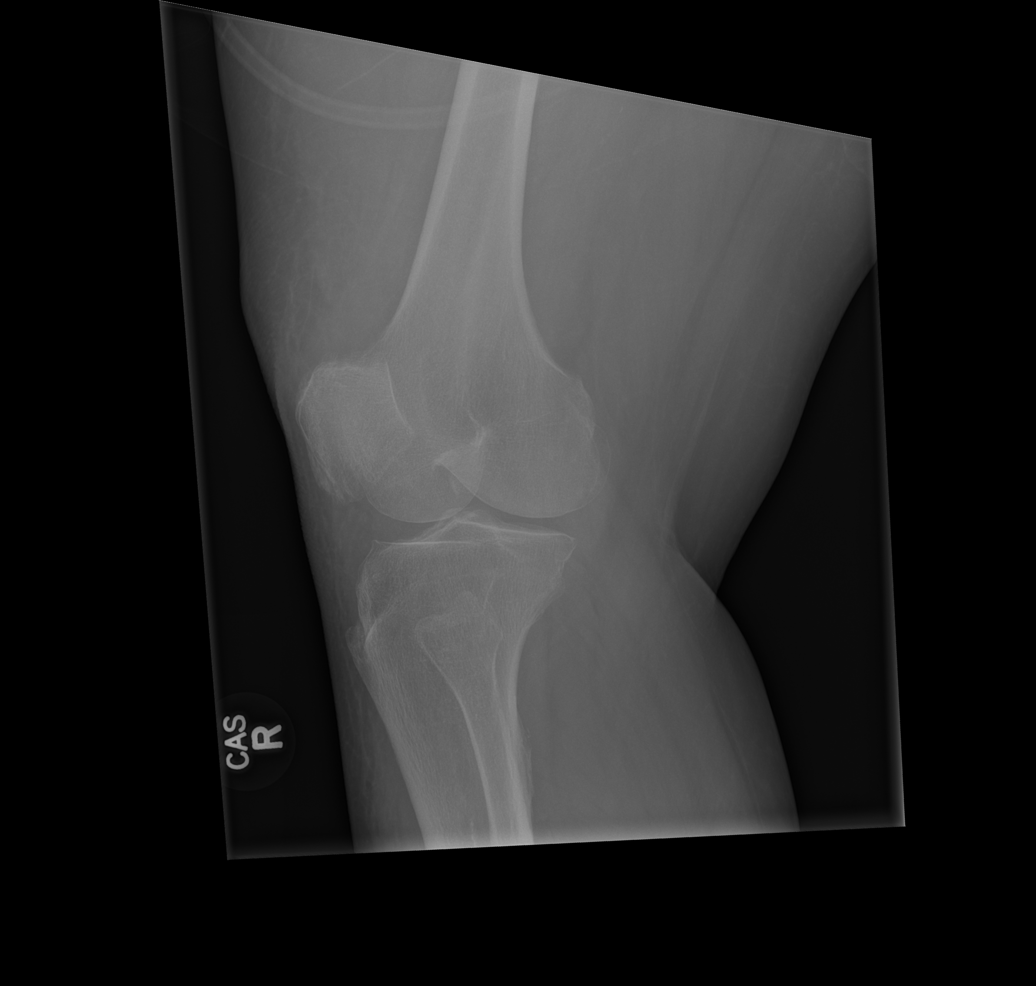

[x knee obl right (1 of 3)]
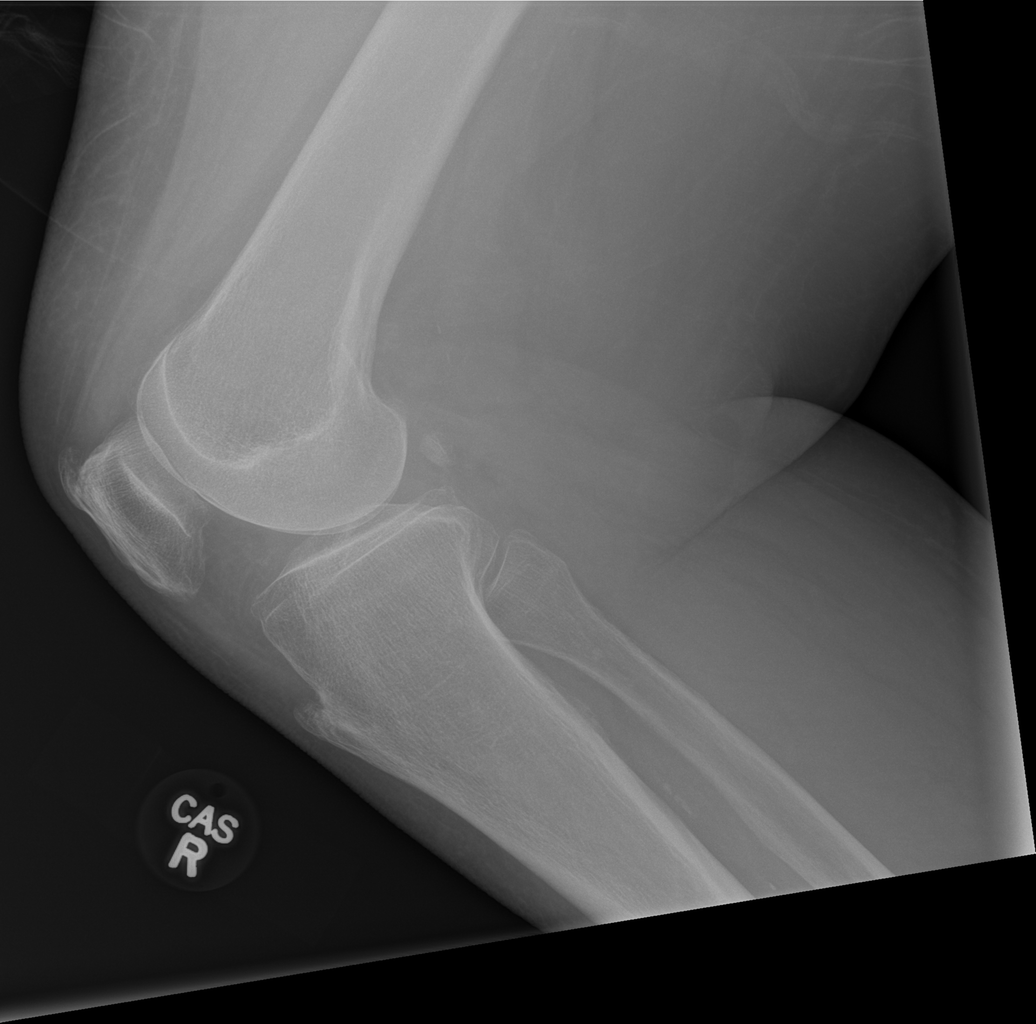

[x knee obl right (2 of 3)]
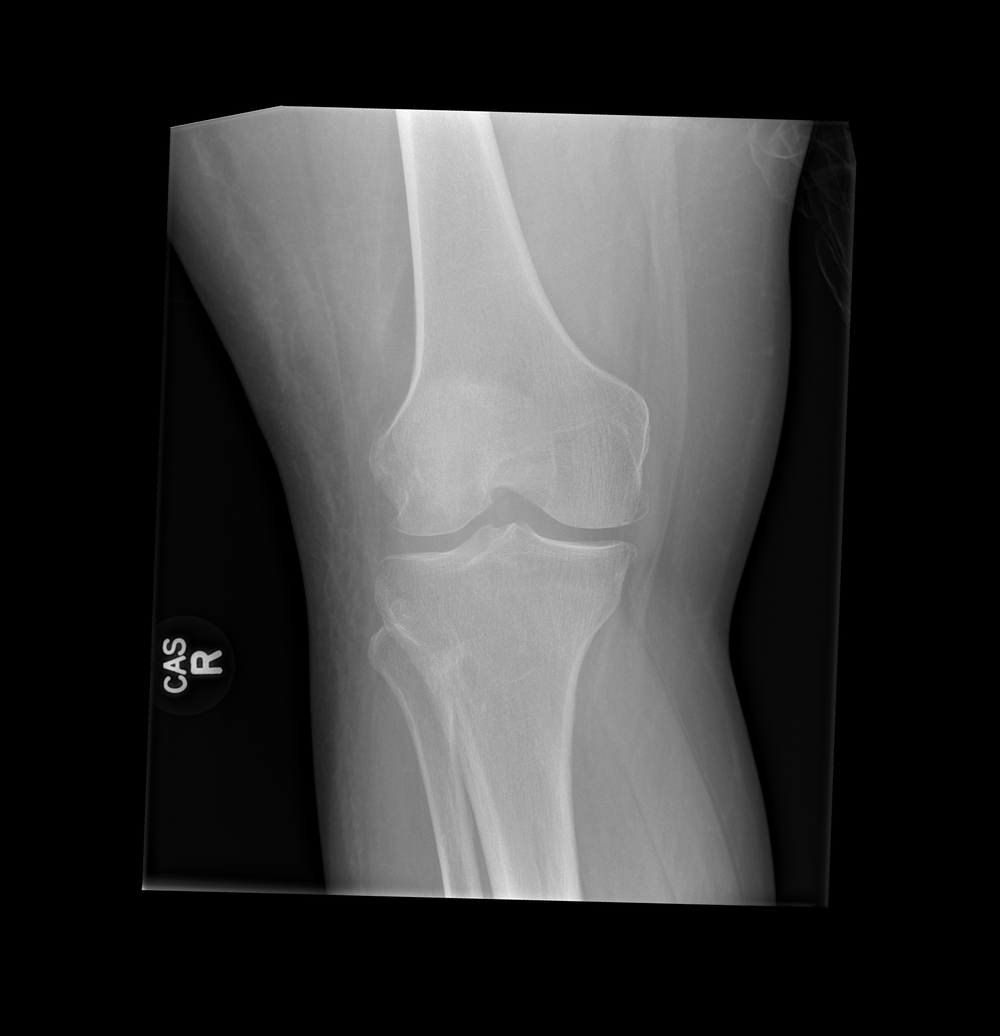

[x knee obl right (3 of 3)]
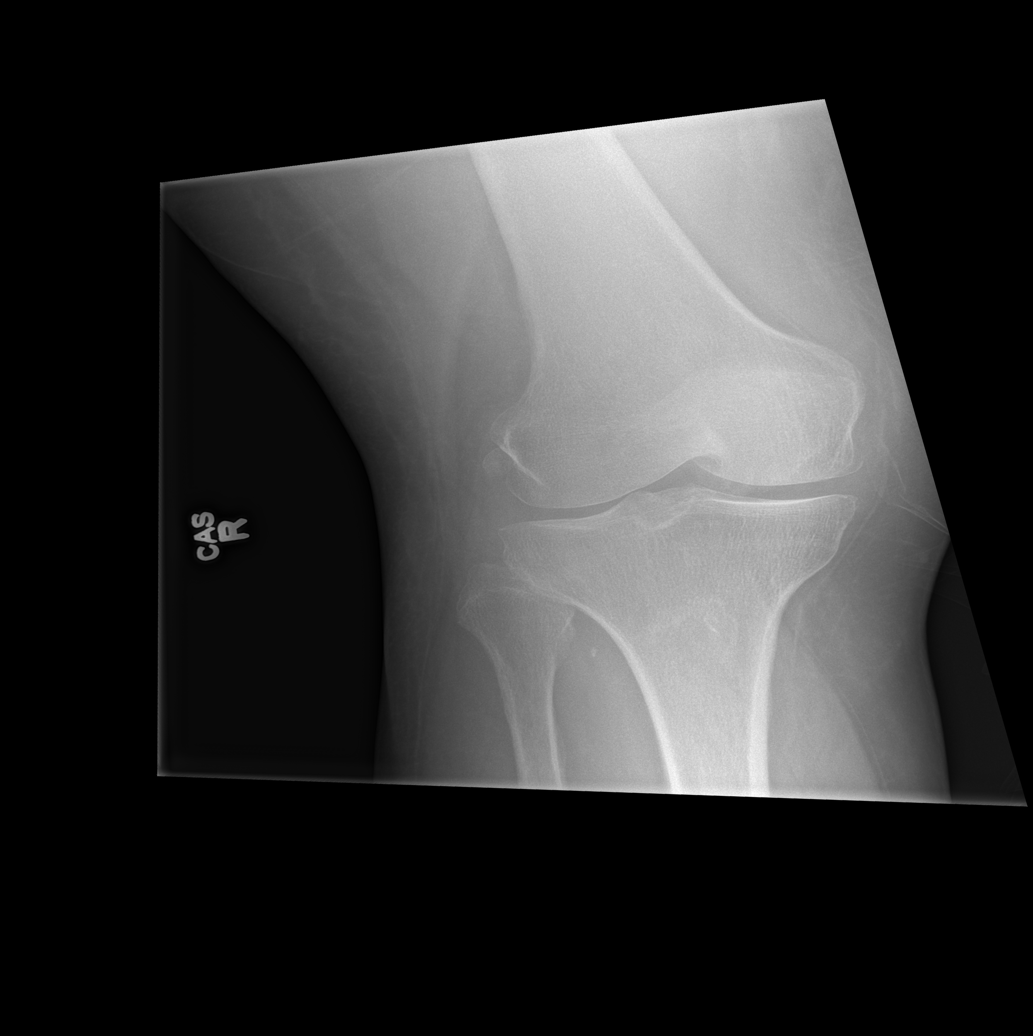

[4 of 4 positions shown; findings below may reference images not displayed]

FINDINGS: There is no evidence of fracture, dislocation, or joint effusion.
There is no evidence of arthropathy or other focal bone abnormality.
Minimal tricompartmental degenerative change. Incidental note is
made of a fabella. Soft tissues are unremarkable.
IMPRESSION: Negative.

Minimal tricompartmental degenerative changes.

## 2016-02-04 IMAGING — CR DG KNEE COMPLETE 4+V*L*
4 series · 4 of 4 positions shown · non-contrast
Comparison: Concurrently obtained radiographs of the contralateral
right knee

CLINICAL DATA: Fall, bilateral knee pain

EXAM:
LEFT KNEE - COMPLETE 4+ VIEW

[x knee ap left]
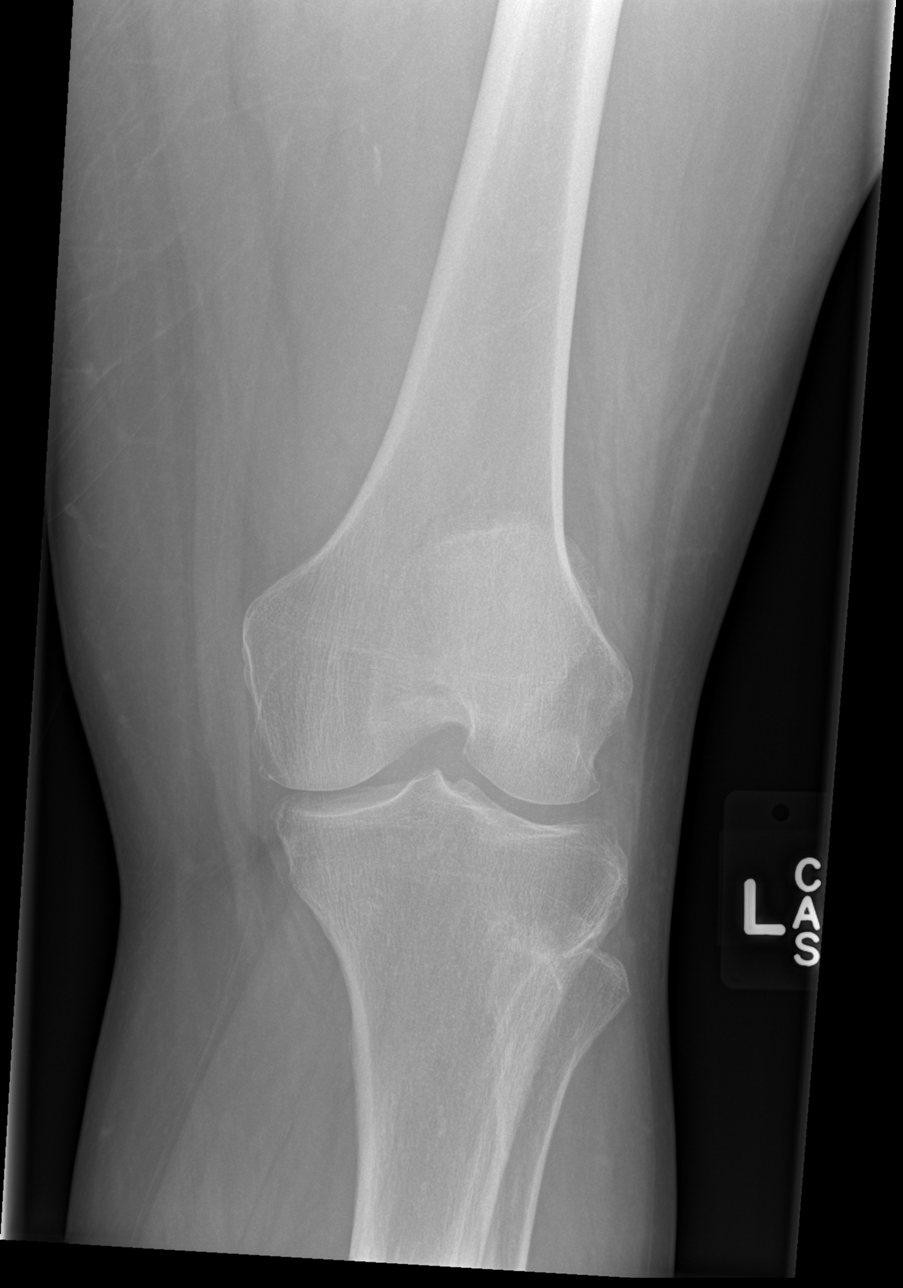

[x knee obl left (1 of 2)]
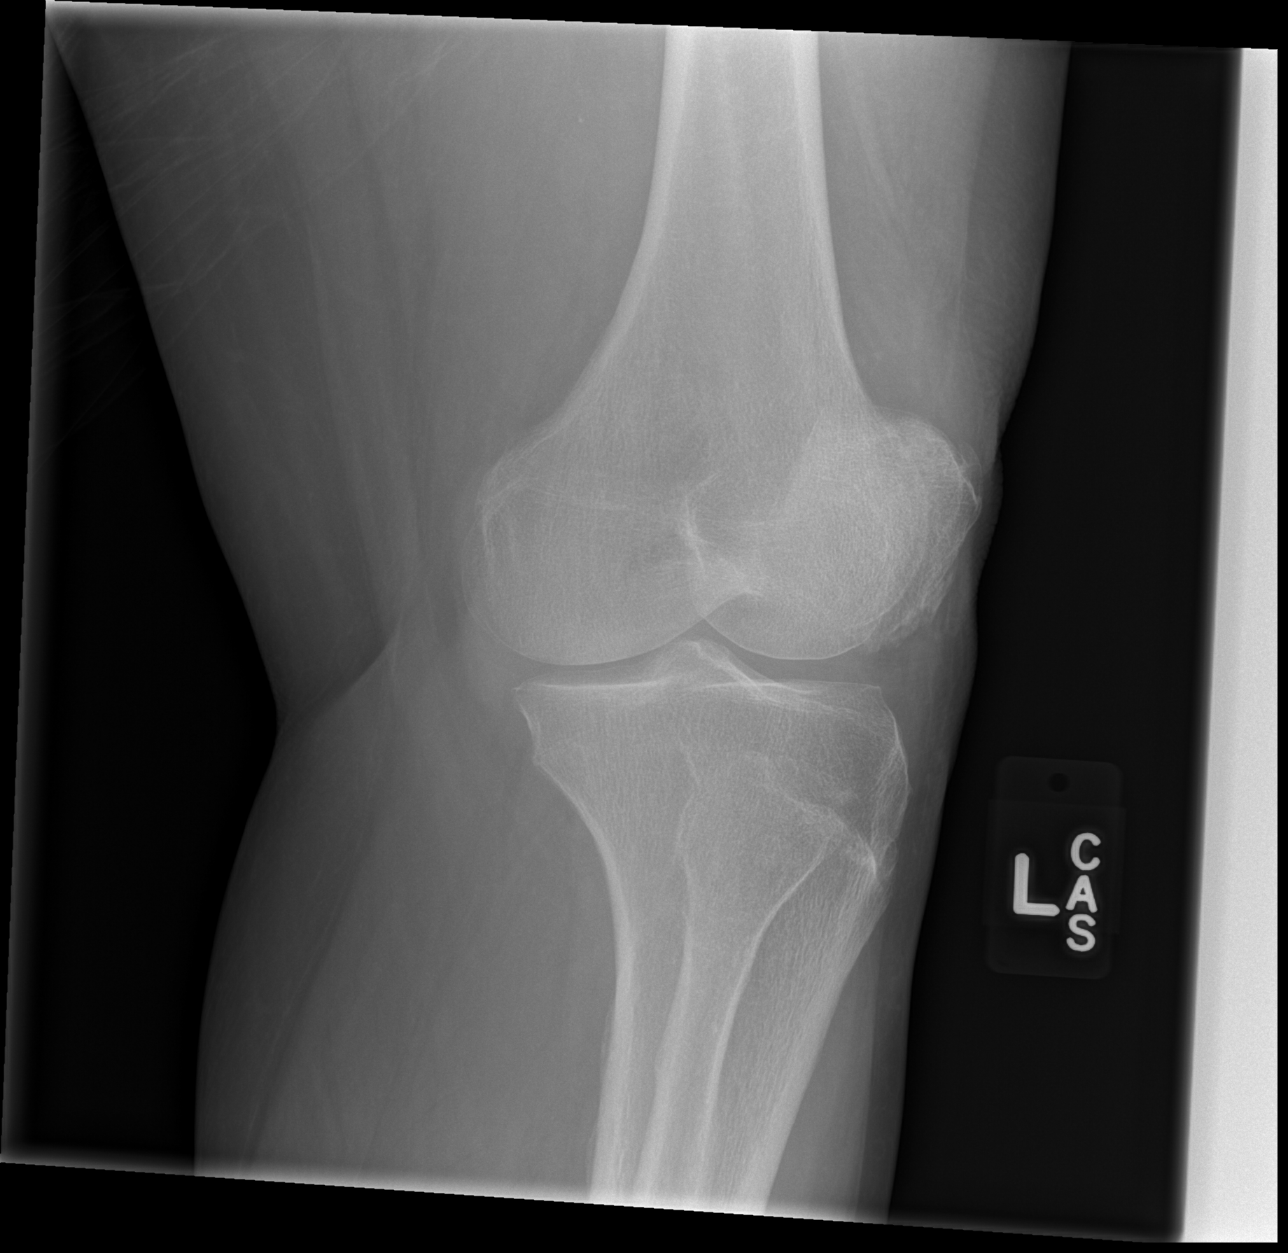

[x knee obl left (2 of 2)]
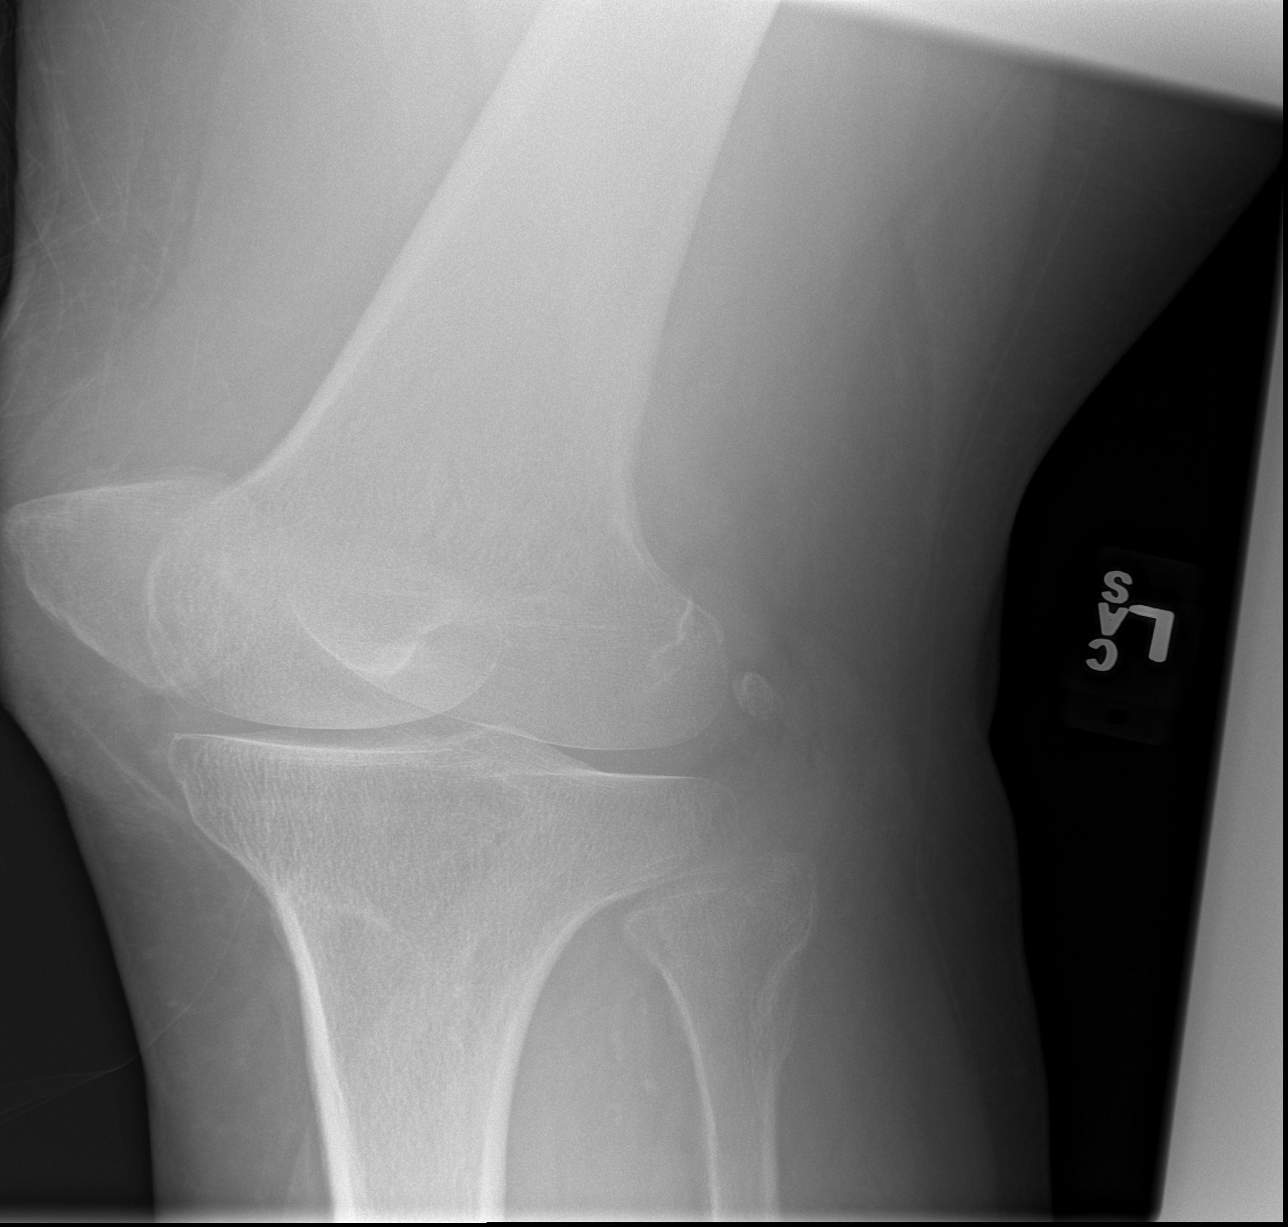

[x knee lat left]
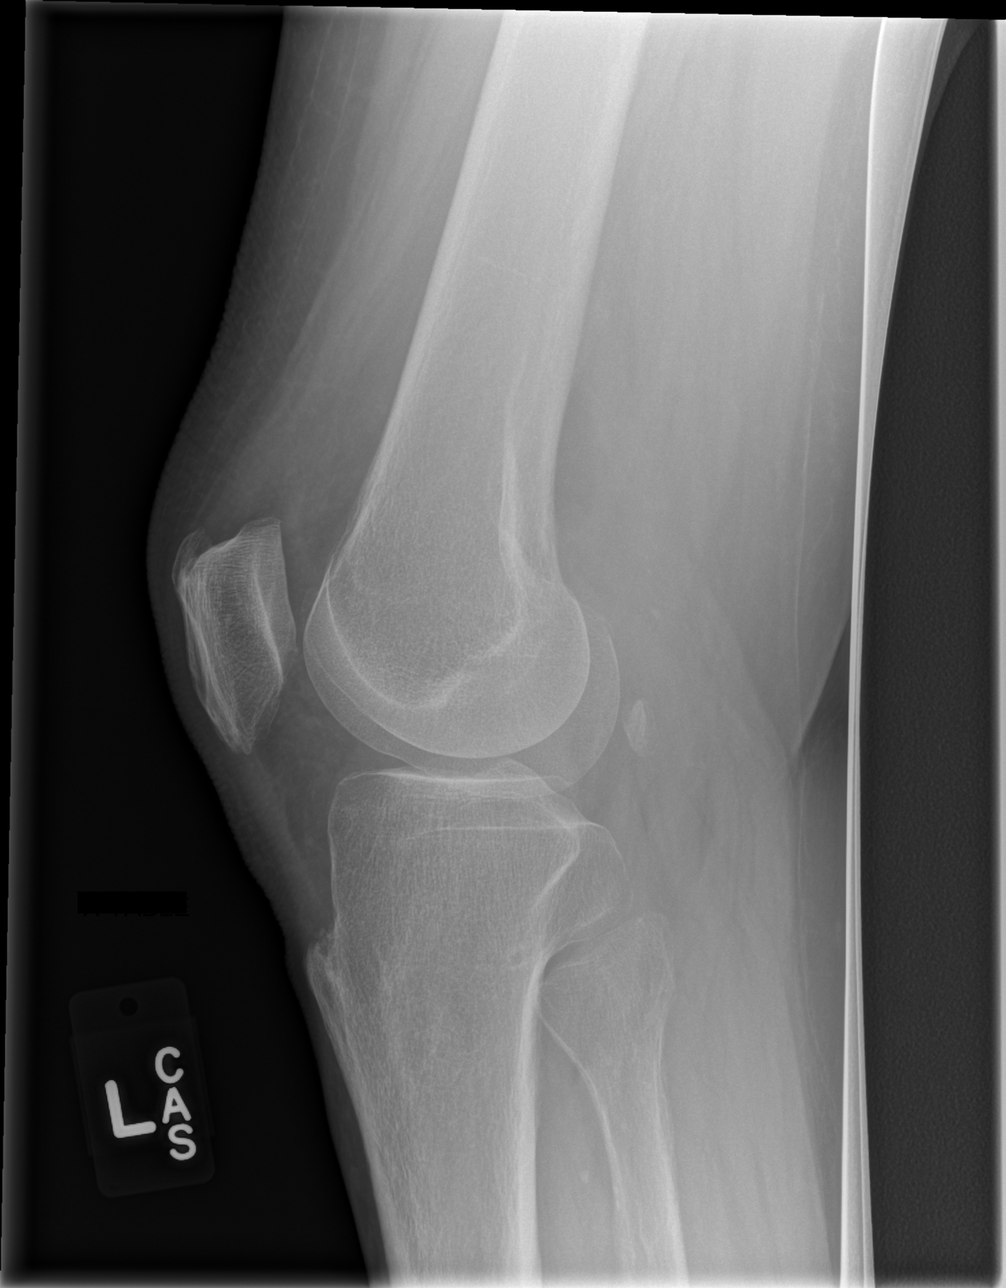

[4 of 4 positions shown; findings below may reference images not displayed]

FINDINGS: There is no evidence of fracture, dislocation, or joint effusion.
There is no evidence of arthropathy or other focal bone abnormality.
Incidental note is made of a fabella. Minimal tricompartmental
degenerative changes. Soft tissues are unremarkable. Trace
atherosclerotic calcification along the course of the superficial
femoral artery.
IMPRESSION: 1. No acute fracture, malalignment or joint effusion.
2. Minimal tricompartmental degenerative changes.
3. Trace atherosclerotic calcification along the course of the
superficial femoral artery.

## 2016-02-04 IMAGING — CT CT HEAD W/O CM
2 series · 16 of 30 positions shown, 20 images · non-contrast
Comparison: None.

CLINICAL DATA: Dizzy.  Near Fall.

EXAM:
CT HEAD WITHOUT CONTRAST
TECHNIQUE: Contiguous axial images were obtained from the base of the skull
through the vertex without intravenous contrast.

[Series 2: head w/o · axial · non-contrast · 0.47mm/px · z∈[-78,+52]mm · 13 of 32 slices shown, 17 images]
[im 3/32  brain]
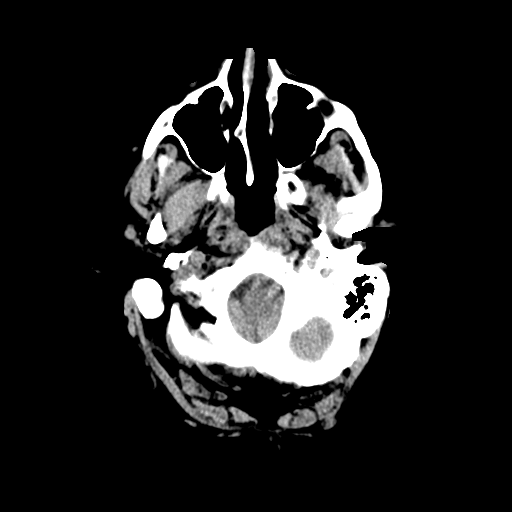
[im 3/32  bone]
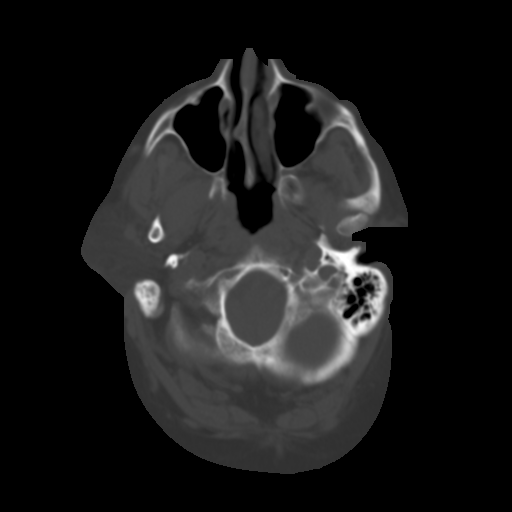
[im 5/32  brain]
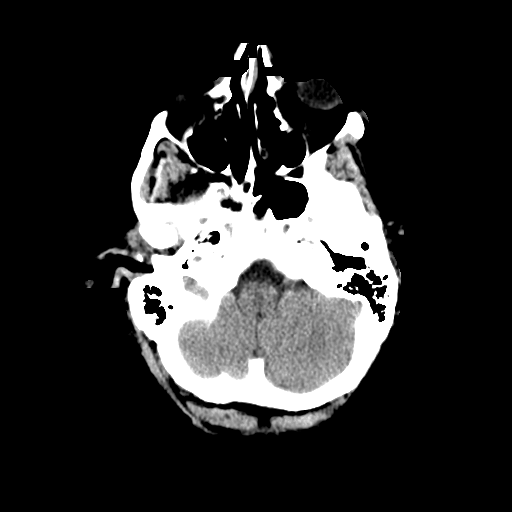
[im 7/32  brain]
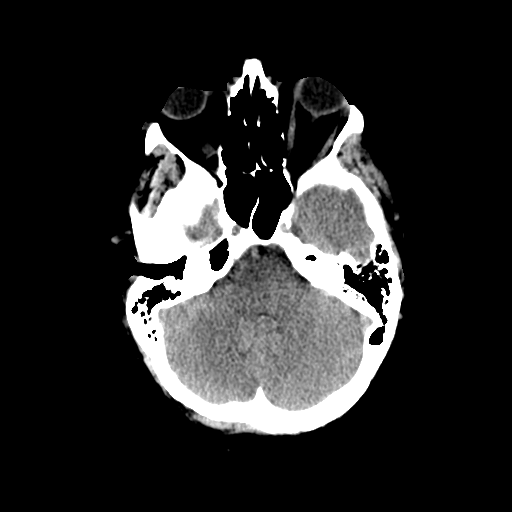
[im 9/32  brain]
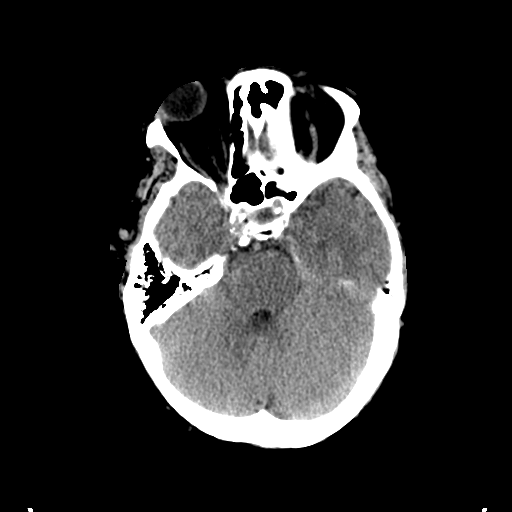
[im 12/32  brain]
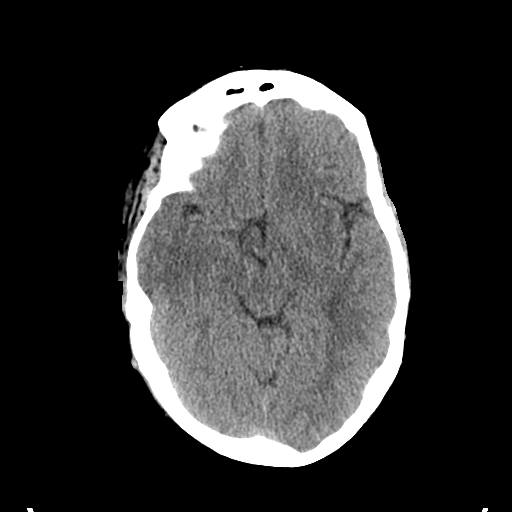
[im 12/32  bone]
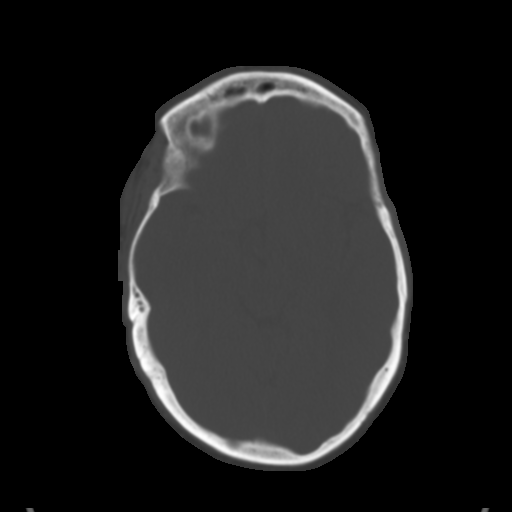
[im 14/32  brain]
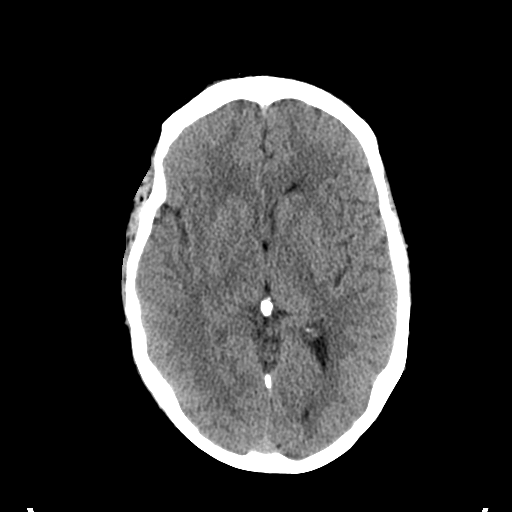
[im 16/32  brain]
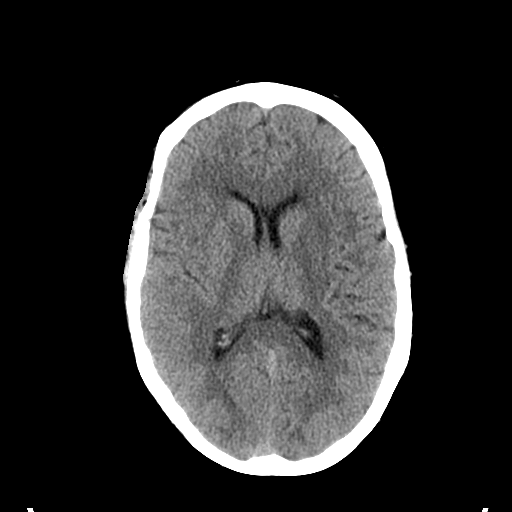
[im 18/32  brain]
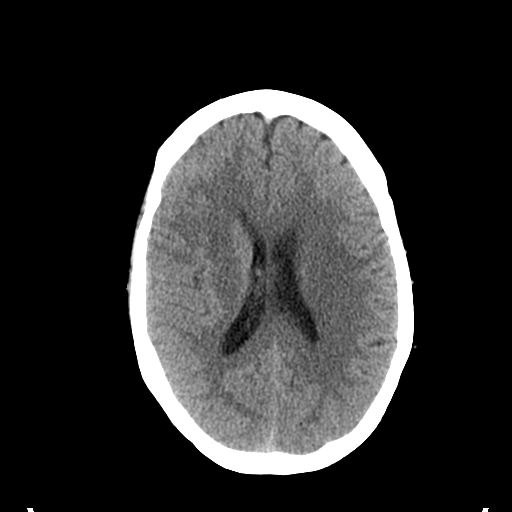
[im 20/32  brain]
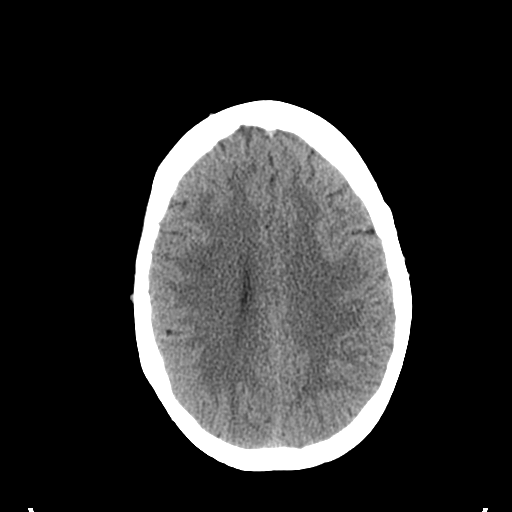
[im 20/32  bone]
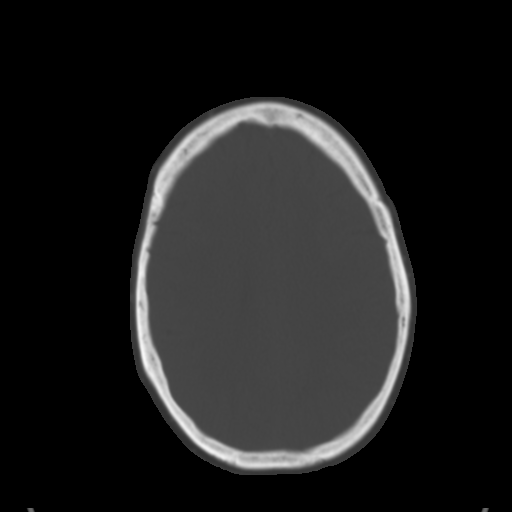
[im 23/32  brain]
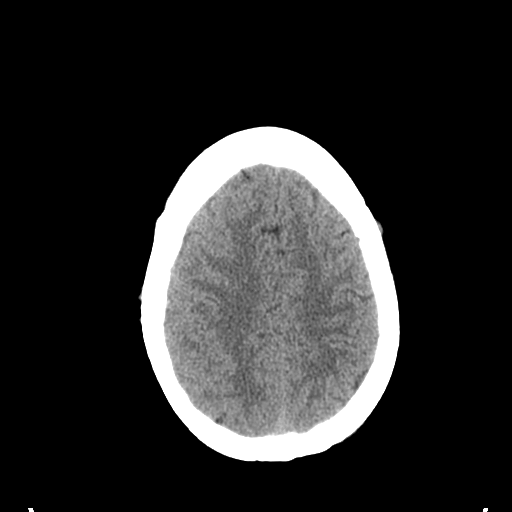
[im 25/32  brain]
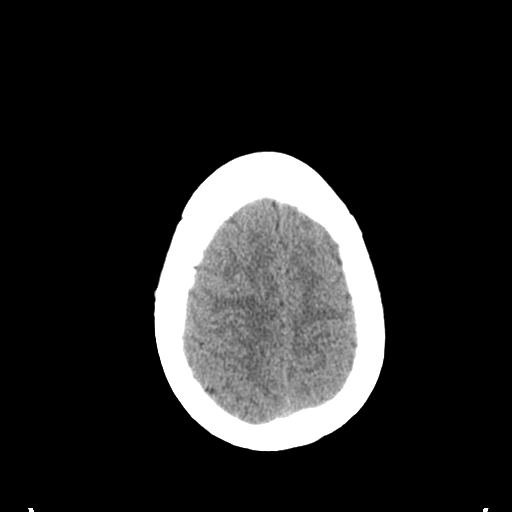
[im 27/32  brain]
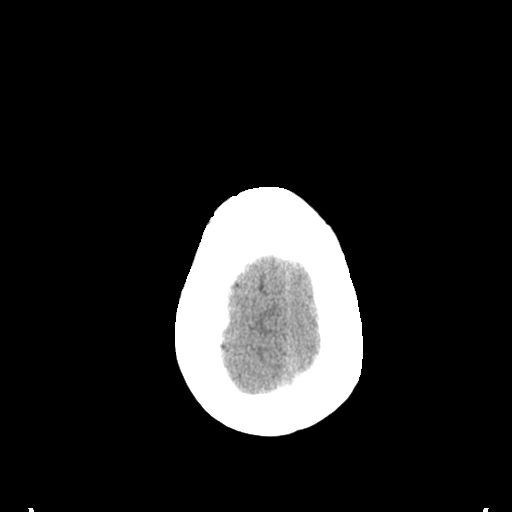
[im 29/32  brain]
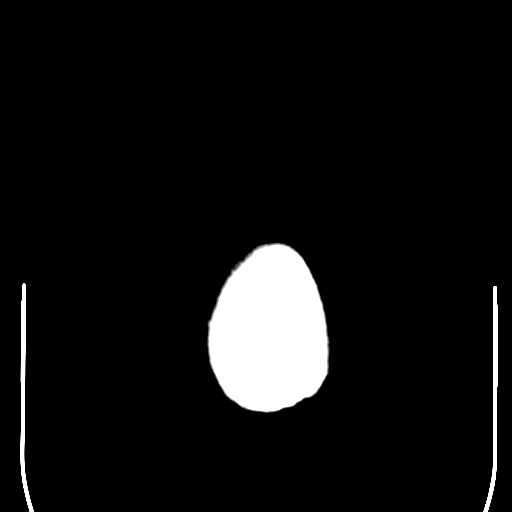
[im 29/32  bone]
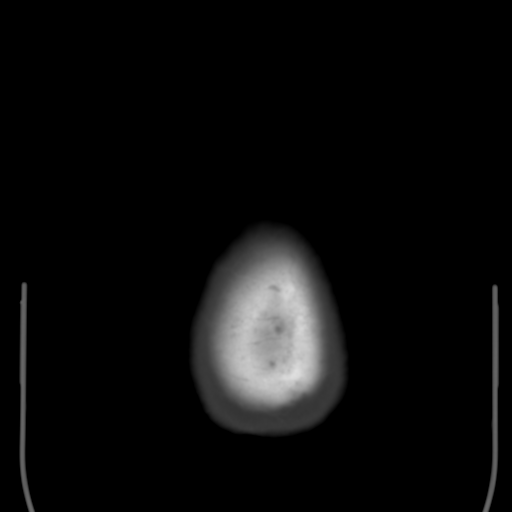

[Series 3: bone windows · axial · 0.47mm/px · z∈[-78,-33]mm · 3 of 32 slices shown]
[im 3/32  bone]
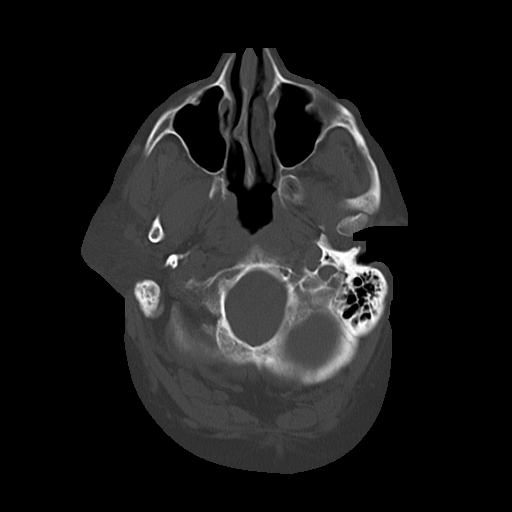
[im 7/32  bone]
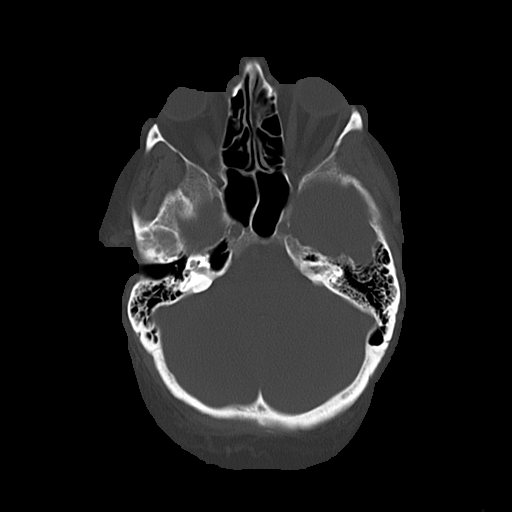
[im 12/32  bone]
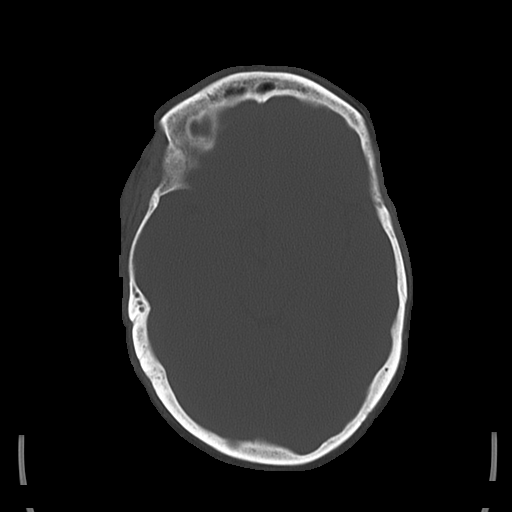

[16 of 30 positions shown; findings below may reference images not displayed]

FINDINGS: Ventricles are normal in size and configuration. There are no
parenchymal masses or mass effect. There is no evidence of an
infarct. Minimal white matter hypoattenuation is noted consistent
with chronic microvascular ischemic change.

No extra-axial masses or abnormal fluid collections.

There is no intracranial hemorrhage.

Visualized sinuses and mastoid air cells are clear.
IMPRESSION: 1. No acute intracranial abnormalities.
2. Minimal chronic microvascular ischemic change.
3. No other abnormalities.

## 2016-02-04 MED ORDER — IPRATROPIUM-ALBUTEROL 0.5-2.5 (3) MG/3ML IN SOLN
3.0000 mL | Freq: Once | RESPIRATORY_TRACT | Status: AC
  Administered 2016-02-04: 3 mL via RESPIRATORY_TRACT
  Filled 2016-02-04: qty 3

## 2016-02-04 MED ORDER — FUROSEMIDE 10 MG/ML IJ SOLN
40.0000 mg | Freq: Once | INTRAMUSCULAR | Status: AC
  Administered 2016-02-04: 40 mg via INTRAVENOUS
  Filled 2016-02-04: qty 4

## 2016-02-04 MED ORDER — ACETAMINOPHEN 500 MG PO TABS
1000.0000 mg | ORAL_TABLET | Freq: Once | ORAL | Status: AC
  Administered 2016-02-04: 1000 mg via ORAL
  Filled 2016-02-04: qty 2

## 2016-02-04 NOTE — H&P (Signed)
Date: 02/04/2016               Patient Name:  [name] MRN: [id number]  DOB: 04/05/1955 Age / Sex: 60 y.o., female   PCP: Oval Linsey, MD         Medical Service: Internal Medicine Teaching Service         Attending Physician: Dr. Lucious Groves, DO    First Contact: Dr. Alphonzo Grieve, MD Pager: [phone]  Second Contact: Dr. Burgess Estelle, MD Pager: [phone]       After Hours (After 5p/  First Contact Pager: [phone]  weekends / holidays): Second Contact Pager: [phone]   Chief Complaint: cough, shortness of breath, fevers x 4 days  History of Present Illness:  This is a 60 y/o F with MHx significant for diastolic CHF (EF 44-73%), Severe COPD on continuous 4L oxygen therapy, pulmonary HTN, CAD and primary lung cancer s/p curative radiation who presents for evaluation of a 4 day history of increased shortness of breath, productive cough and subjective fevers. Patient reports she uses constant oxygen therapy of 4L however has needed to increase this to 4.5L over the past several days secondary to worsening shortness of breath. She also endorses a cough productive of scant yellow sputum and subjective fevers and chills. She reports the last time this happened was at the beginning of the year and was diagnosed with a COPD exacerbation. Reports compliance with prescribed medications and inhalers. In regards to the patients CHF history, she follows with Dr[facility]ine [facility] and weighs herself daily with a self-reported dry weight of 245 lbs. She reports she has weighed 238 for the past several days secondary to decreased PO intake due to feeling ill. Despite her decreased fluid intake, she continued to take Lasix 80 mg daily. She denies any runny nose, nasal congestion, sinus pressure, headache, abdominal pain or fullness, diarrhea, constipation or chest pain.  In route to the ED she received IV steroids and nebulizer treatments with improvement of her symptoms. In the ED,  she was given an additional nebulizer treatment and vital signs showed a temp of 99.9*, pulse of 77, respirations initially 30 however diminished to 20 with resp treatment, saturating 100% on 4.5 L via [place], BP 105/93 and weight of 239. Portable CXR demonstrated a moderate-sized left pleural effusion, left basilar atelectasis, stable cardiomegaly and pulmonary vascular congestion with probable mild interstitial edema. CMP results show AKI with creatinine of 3.4 (baseline 1.3) and GFR of 14 (baseline 42). CBC results show mild leukocytosis at 10.7, and anemia at 10.3. BNP normal at 20.7. She was subsequently admitted to the IMTS for management of her COPD exacerbation and AKI.   Meds:  Current Meds  Medication Sig  . albuterol (PROVENTIL) (2.5 MG/3ML) 0.083% nebulizer solution Take 3 mLs (2.5 mg total) by nebulization every 6 (six) hours as needed for shortness of breath.  Marland Kitchen albuterol (VENTOLIN HFA) 108 (90 Base) MCG/ACT inhaler Inhale 1-2 puffs into the lungs every 6 (six) hours as needed for shortness of breath.  Marland Kitchen aspirin 81 MG chewable tablet Chew 1 tablet (81 mg total) by mouth at bedtime.  Marland Kitchen buPROPion (WELLBUTRIN SR) 150 MG 12 hr tablet Take 1 tablet (150 mg total) by mouth 2 (two) times daily. Start with 150 mg once daily for 3 days  . Fluticasone-Salmeterol (ADVAIR DISKUS) 250-50 MCG/DOSE AEPB Inhale 1 puff into the lungs 2 (two) times daily.  . furosemide (LASIX) 40 MG tablet Take  80 mg by mouth daily.  Marland Kitchen gabapentin (NEURONTIN) 300 MG capsule Take 1 capsule (300 mg total) by mouth 3 (three) times daily. (Patient taking differently: Take 300-600 mg by mouth at bedtime. Take 1-2 caps depending on pain)  . lisinopril (PRINIVIL,ZESTRIL) 5 MG tablet Take 1 tablet (5 mg total) by mouth at bedtime.  . metFORMIN (GLUCOPHAGE) 500 MG tablet Take 1 tablet (500 mg total) by mouth 2 (two) times daily with a meal.  . metolazone (ZAROXOLYN) 2.5 MG tablet TAKE 1 TABLET BY MOUTH AS NEEDED(FOR 5 LBS WEIGHT GAIN)    . OXYGEN Inhale 4.5 L into the lungs continuous.   . pantoprazole (PROTONIX) 40 MG tablet Take 1 tablet (40 mg total) by mouth daily.  . potassium chloride SA (K-DUR,KLOR-CON) 20 MEQ tablet Take 1 tablet (20 mEq total) by mouth daily.  . rosuvastatin (CRESTOR) 20 MG tablet Take 1 tablet (20 mg total) by mouth daily.  Marland Kitchen tiotropium (SPIRIVA HANDIHALER) 18 MCG inhalation capsule Place 1 capsule (18 mcg total) into inhaler and inhale daily.  . vitamin B-12 (CYANOCOBALAMIN) 1000 MCG tablet Take 1 tablet (1,000 mcg total) by mouth daily.   Allergies: Allergies as of 02/04/2016 - Review Complete 02/04/2016  Allergen Reaction Noted  . Fluconazole Anaphylaxis, Itching, and Swelling   . Atorvastatin Other (See Comments) 09/09/2014   Past Medical History:  Diagnosis Date  . Arthritis    "some scattered" (03/03/2015)  . CHF (congestive heart failure) ([place])   . Chronic bronchitis ([place])    "get it most years" (03/03/2015)  . COPD (chronic obstructive pulmonary disease) (HCC)    Severe. Gold Stage IV.  PFTs (12/2008) - severe obstructive airway disease. Active tobacco use. Requires 4L O2 at home.  . Coronary artery disease    S/P PCI of LAD with DES (12/2008). Total occlusion of RCA noted at that time., medically managed. ACS ruled out 03/2009 with Lexiscan myoview . Followed by [facility].  . Depression   . Diastolic dysfunction    2-D Echo (12/2008) - Normal LV Systolic funciton with EF 60-65%. Grade 1 diastolid dysfunction. No regional wall motion abnormalities. Moderate pulmonary HTN with PA peak pressure 46mHg.  . Full dentures   . GERD (gastroesophageal reflux disease)    S/P Nissen fundoplication.  .Marland KitchenHistory of hiatal hernia   . Hx MRSA infection    Recurrent MRSA thigh abscesses.  . Hyperlipidemia   . Lung cancer ([facility]    "left"  . Obesity   . On home oxygen therapy since 2010   4L all the time  . Pneumonia   . Prediabetes    HgbA1c 6.4 (12/2008)  . Pulmonary hypertension    2-D  Echo (058/4465 - Systolic pressure was moderately increased. PA peak pressure  534mg. 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 Followed by Dr. WrJoya GaskinsLVelora Heckler . Pulmonary nodule, right    Small right middle lobe nodule. Stable as of 12/2008.  . Marland Kitchenhortness of breath dyspnea    with a lot of exertion; if fluid builds up   Family History:  Mother: Deceased at age 6445ue to complications of heart failure Father: Deceased at age 2347ue to complications of heart failure  Social History: She recently quit smoking however previously smoked 1 pack/day x45 yrs. She denies any alcohol or drug use.   Review of Systems: A complete ROS was negative except as per HPI.  Physical Exam: Blood pressure 131/71, pulse 86, temperature 99.9 F (37.7 C), temperature source Rectal, resp. rate 20, height _0  (1.651 m), weight 239 lb (108.4 kg), SpO2 100 %. General: Obese caucasian female resting in bed with the head of the bed elevated. In no acute distress. On 4.5 L oxygen via  HENT: PERLL. Oropharynx clear. Mucous membranes dry. No TTP of sinuses. No periorbital edema. Cardiovascular: RRR without murmur, gallop or rub.  Pulmonary: End expiratory wheezes diffusely bilaterally.  Abdomen: Obese abdomen. Non-tender, not distended. No appreciable fluid-wave Extremities: Distal pulses intact throughout. No edema of BL LE.  Skin: Warm, dry. No weeping blisters.   EKG:   EKG Interpretation  Date/Time:  Thursday February 04 2016 18:42:38 EDT Ventricular Rate:  78 PR Interval:    QRS Duration: 101 QT Interval:  372 QTC Calculation: 424 R Axis:   81 Text Interpretation:  Sinus rhythm Low voltage, precordial leads Consider RVH w/ secondary repol abnormality Confirmed by DELO  MD, DOUGLAS ([id number]) on 02/04/2016 7:29:31 PM      Portable CXR: Moderate left pleural effusion, increased since prior  examination. Stable cardiomegaly, pulmonary vascular congestion, chronic interstitial lung disease as well as probable mild interstitial pulmonary edema.   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 This likely represents an acute COPD exacerbation and patient reports this feels similar to prior exacerbations. In addition, her symptoms improved with steroid administration and Duoneb treatments. CXR was without consolidation that would be concerning for pneumonia. Chart review shows no recent antibiotic use, patient will be started on Levaquin. -2 view CXR ordered for better characterization -Sputum culture ordered if patient able to produce sample -CBC in AM -Levaquin daily -Start scheduled Duonebs Q6H. Will continue Advair. Holding Spiriva -Pt received Solumedrol en route to hospital and will continue steroid therapy with Prednisone 40 mg x5 days  Acute Kidney Injury, CKD Stage 3 Creatinine 3.4 with baseline of approximately 1.3. GFR currently 14 with baseline ~40-50. This is most likely secondary to patient taking Lasix 80 mg daily while having little to no PO/fluid intake. She denies any urinary symptoms, flank pain or lower abdominal pain. Patient received 1 dose of IV Lasix 40 mg while in ED. Electrolytes normal. She will require gentle  rehydration therapy.  -Gentle rehydration with 15m ns/hr x 12 hours for a total of 900 mL.  -Will need to monitor fluid status as patient has hx of frequent CHF exacerbations however at most recent HF visit there were concerns of patient being dry -Holding Metformin and Lisinopril  -Repeat CMP in AM  Chronic Diastolic Congestive Heart Failure Most recent ECHO 2/16 showed EF of 50-55% with LVH and grade 1 diastolic dysfunction.The patient follows with Dr. BHaroldine [facility] with most recent visit 9/17. She weighs herself daily with a reported dry weight of 245 lbs. Weight today 238 lbs. She takes Lasix  80 mg daily as well as PRN Metolazone however has not required this as needed medication lately. Patient appears quite dry on examination. She has no bibasilar crackles, no edema of BL LE and skin feels dry.  -Special attention to fluid balance during gentle rehydration -Holding tomorrows Lasix 80 mg, may consider re-adding based on her response to fluid  Primary Lung Cancer, Left Stage IIA (T2b, N0, M0) non-small cell lung cancer, adenocarcinoma diagnosed 8/16. S/p curative radiotherapy currently on observational therapy. Patient follows with Dr. Julien Nordmann of Oncology with most recent visit 8/17.  -Dr. Julien Nordmann recs f/u CT in January, 2018  Type 2 Diabetes Most recent HbA1c 6.5% in 8/17. Patient takes Metformin 1000 mg daily however will need to hold this medication in the setting of AKI -Restart Metformin once kidneys able to tolerate -ISS  Diet: HH IVF: ns @ 75 ml/hr x 12 hrs, caution for volume overload Code Status: full code DVT Prophylaxis: Heparin  Dispo: Admit patient to Inpatient with expected length of stay greater than 2 midnights.  SignedEinar Gip, DO 02/04/2016, 10:10 PM  Pager: [phone]

## 2016-02-04 NOTE — ED Provider Notes (Signed)
[facility] DEPT Provider Note   CSN: [id number] Arrival date & time: 02/04/16  1839     History   Chief Complaint Chief Complaint  Patient presents with  . Shortness of Breath    HPI [name] is a 60 y.o. female.  Patient is 60 year old female with extensive past medical history including CHF, COPD, coronary artery disease with stent, high cholesterol, and lung cancer. She presents for evaluation of shortness of breath. This started earlier today and has rapidly worsened. She is on home oxygen at 4 L and this was not helping. EMS was called and the patient was transported here.   The history is provided by the patient.  Shortness of Breath  This is a new problem. The average episode lasts 1 day. The problem occurs continuously.The problem has been rapidly worsening. Pertinent negatives include no fever, no cough, no sputum production and no chest pain. She has tried nothing for the symptoms. She has had prior hospitalizations. Associated medical issues include COPD, CAD and heart failure.    Past Medical History:  Diagnosis Date  . Arthritis    "some scattered" (03/03/2015)  . CHF (congestive heart failure) ([place])   . Chronic bronchitis ([place])    "get it most years" (03/03/2015)  . COPD (chronic obstructive pulmonary disease) (HCC)    Severe. Gold Stage IV.  PFTs (12/2008) - severe obstructive airway disease. Active tobacco use. Requires 4L O2 at home.  . Coronary artery disease    S/P PCI of LAD with DES (12/2008). Total occlusion of RCA noted at that time., medically managed. ACS ruled out 03/2009 with Lexiscan myoview . Followed by [facility].  . Depression   . Diastolic dysfunction    2-D Echo (12/2008) - Normal LV Systolic funciton with EF 60-65%. Grade 1 diastolid dysfunction. No regional wall motion abnormalities. Moderate pulmonary HTN with PA peak pressure 65mHg.  . Full dentures   . GERD (gastroesophageal reflux disease)    S/P Nissen fundoplication.  .Marland Kitchen History of hiatal hernia   . Hx MRSA infection    Recurrent MRSA thigh abscesses.  . Hyperlipidemia   . Lung cancer ([facility]    "left"  . Obesity   . On home oxygen therapy since 2010   4L all the time  . Pneumonia   . Prediabetes    HgbA1c 6.4 (12/2008)  . Pulmonary hypertension    2-D Echo (044/0347 - Systolic pressure was moderately increased. PA peak pressure  570mg. 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 Followed by Dr. WrJoya GaskinsLVelora Heckler . Pulmonary nodule, right    Small right middle lobe nodule. Stable as of 12/2008.  . Marland Kitchenhortness of breath dyspnea    with a lot of exertion; if fluid builds up    Patient Active Problem List   Diagnosis Date Noted  . Benign paroxysmal positional vertigo 09/10/2015  . Subjective tinnitus 09/10/2015  . Chronic venous insufficiency   . Vitamin B12 deficiency neuropathy ([facility]09/18/2016  . Primary adenocarcinoma of left lung ([facility]09/04/2014  . Healthcare maintenance 02/06/2013  . Chronic diastolic heart failure ([facility]08/07/2012  . Hyperlipidemia   . Financial difficulties 06/15/2010  . Pulmonary hypertension 01/05/2009  . Obesity, Class II, BMI 35-39.9 12/31/2008  . Sleep apnea 12/31/2008  . Tobacco abuse 12/22/2008  . 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 Severe chronic obstructive pulmonary disease (HCDrain09/20/2010  .  Gastroesophageal reflux disease 12/22/2008  . 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 ([place]) 12/22/2008    Past Surgical History:  Procedure Laterality Date  . ABDOMINAL HYSTERECTOMY  1980's   "still have my cervix"  . APPENDECTOMY    . BACK SURGERY    . BILATERAL OOPHORECTOMY  1979  . BREAST LUMPECTOMY Right 1990's    (biopsy negative)  . CORONARY ANGIOPLASTY WITH STENT PLACEMENT  2010   [place]; RCA  . HERNIA REPAIR    . KNEE ARTHROSCOPY Right ~ 2000    . [place] SURGERY  2001   "herniated discs; [facility], Dr. Lorin Mercy"  . NISSEN FUNDOPLICATION    . RIGHT HEART CATHETERIZATION N/A 11/09/2012   Procedure: RIGHT HEART CATH;  Surgeon: Larey Dresser, MD;  Location: [facility] CATH LAB;  Service: Cardiovascular;  Laterality: N/A;  . TONSILLECTOMY    . TUBAL LIGATION  1979  . VIDEO BRONCHOSCOPY WITH ENDOBRONCHIAL ULTRASOUND N/A 12/15/2014   Procedure: VIDEO BRONCHOSCOPY WITH ENDOBRONCHIAL ULTRASOUND;  Surgeon: Ivin Poot, MD;  Location: [facility] OR;  Service: Thoracic;  Laterality: N/A;    OB History    No data available       Home Medications    Prior to Admission medications   Medication Sig Start Date End Date Taking? Authorizing Provider  albuterol (PROVENTIL) (2.5 MG/3ML) 0.083% nebulizer solution Take 3 mLs (2.5 mg total) by nebulization every 6 (six) hours as needed for shortness of breath. 09/05/14   Oval Linsey, MD  albuterol (VENTOLIN HFA) 108 (90 Base) MCG/ACT inhaler Inhale 1-2 puffs into the lungs every 6 (six) hours as needed for shortness of breath. 02/03/16   Oval Linsey, MD  aspirin 81 MG chewable tablet Chew 1 tablet (81 mg total) by mouth at bedtime. 01/16/15   Oval Linsey, MD  Aspirin-Salicylamide-Caffeine (BC HEADACHE POWDER PO) Take 1 packet by mouth 3 (three) times daily as needed (pain).    Historical Provider, MD  buPROPion (WELLBUTRIN SR) 150 MG 12 hr tablet Take 1 tablet (150 mg total) by mouth 2 (two) times daily. Start with 150 mg once daily for 3 days 11/20/15   Oval Linsey, MD  Fluticasone-Salmeterol (ADVAIR DISKUS) 250-50 MCG/DOSE AEPB Inhale 1 puff into the lungs 2 (two) times daily. 06/04/15   Oval Linsey, MD  furosemide (LASIX) 40 MG tablet Take 80 mg by mouth daily.    Historical Provider, MD  gabapentin (NEURONTIN) 300 MG capsule Take 1 capsule (300 mg total) by mouth 3 (three) times daily. 02/03/16   Oval Linsey, MD  lisinopril (PRINIVIL,ZESTRIL) 5 MG tablet Take 1 tablet (5 mg total) by  mouth at bedtime. 11/05/15   Amy D Ninfa Meeker, NP  metFORMIN (GLUCOPHAGE) 500 MG tablet Take 1 tablet (500 mg total) by mouth 2 (two) times daily with a meal. 09/10/15   Oval Linsey, MD  metolazone (ZAROXOLYN) 2.5 MG tablet TAKE 1 TABLET BY MOUTH AS NEEDED(FOR 5 LBS WEIGHT GAIN) Patient not taking: Reported on 12/23/2015 05/13/15   [name] Friar, PA-C  OXYGEN Inhale 4 L into the lungs continuous.      pantoprazole (PROTONIX) 40 MG tablet Take 1 tablet (40 mg total) by mouth daily. 11/05/15   Oval Linsey, MD  potassium chloride SA (K-DUR,KLOR-CON) 20 MEQ tablet Take 1 tablet (20 mEq total) by mouth daily. 11/30/15   Oval Linsey, MD  rosuvastatin (CRESTOR) 20 MG tablet Take 1 tablet (20 mg total) by mouth daily. 09/10/15   Oval Linsey, MD  tiotropium [facility]  HANDIHALER) 18 MCG inhalation capsule Place 1 capsule (18 mcg total) into inhaler and inhale daily. 10/01/15   Oval Linsey, MD  vitamin B-12 (CYANOCOBALAMIN) 1000 MCG tablet Take 1 tablet (1,000 mcg total) by mouth daily. 09/10/15   Oval Linsey, MD    Family History Family History  Problem Relation Age of Onset  . Heart disease Mother 30    Deceased from MI at 80yo  . Hypertension Mother   . Heart disease Father 49    Deceased of MI age 64yo  . Hypertension Father   . Hypertension Brother   . Lung cancer      Grandmother    Social History Social History  Substance Use Topics  . Smoking status: Former Smoker    Packs/day: 1.00    Years: 45.00    Types: Cigarettes    Start date: 04/04/1969    Quit date: 11/04/2015  . Smokeless tobacco: Never Used     Comment: 1 cig per day  . Alcohol use No     Allergies   Fluconazole and Atorvastatin   Review of Systems Review of Systems  Constitutional: Negative for fever.  Respiratory: Positive for shortness of breath. Negative for cough and sputum production.   Cardiovascular: Negative for chest pain.  All other systems reviewed and are negative.    Physical Exam Updated  Vital Signs BP 120/58   Pulse 81   Temp 99.9 F (37.7 C) (Rectal)   Resp 20   Ht _0  (1.651 m)   Wt 239 lb (108.4 kg)   SpO2 100%   BMI 39.77 kg/m   Physical Exam  Constitutional: She is oriented to person, place, and time. She appears well-developed and well-nourished. No distress.  HENT:  Head: Normocephalic and atraumatic.  Neck: Normal range of motion. Neck supple.  Cardiovascular: Normal rate and regular rhythm.  Exam reveals no gallop and no friction rub.   No murmur heard. Pulmonary/Chest: Effort normal. No respiratory distress. She has wheezes.  There are expiratory rhonchi bilaterally. The patient is in moderate respiratory distress.  Abdominal: Soft. Bowel sounds are normal. She exhibits no distension. There is no tenderness.  Musculoskeletal: Normal range of motion.  Neurological: She is alert and oriented to person, place, and time.  Skin: Skin is warm and dry. She is not diaphoretic.  Nursing note and vitals reviewed.    ED Treatments / Results  Labs (all labs ordered are listed, but only abnormal results are displayed) Labs Reviewed  CBC WITH DIFFERENTIAL/PLATELET - Abnormal; Notable for the following:       Result Value   WBC 10.7 (*)    RBC 3.52 (*)    Hemoglobin 10.3 (*)    HCT 32.1 (*)    Neutro Abs 8.3 (*)    All other components within normal limits  COMPREHENSIVE METABOLIC PANEL - Abnormal; Notable for the following:    Chloride 96 (*)    Glucose, Bld 108 (*)    BUN 29 (*)    Creatinine, Ser 3.41 (*)    Calcium 8.7 (*)    Albumin 3.3 (*)    AST 12 (*)    ALT 8 (*)    Alkaline Phosphatase 134 (*)    GFR calc non Af Amer 14 (*)    GFR calc Af Amer 16 (*)    All other components within normal limits  BRAIN NATRIURETIC PEPTIDE  I-STAT TROPOININ, ED    EKG  EKG Interpretation  Date/Time:  Thursday February 04 2016  18:42:38 EDT Ventricular Rate:  78 PR Interval:    QRS Duration: 101 QT Interval:  372 QTC Calculation: 424 R  Axis:   81 Text Interpretation:  Sinus rhythm Low voltage, precordial leads Consider RVH w/ secondary repol abnormality Confirmed by Vane Yapp  MD, Tatjana Turcott ([id number]) on 02/04/2016 7:29:31 PM       Radiology Dg Chest Portable 1 View  Result Date: 02/04/2016 CLINICAL DATA:  Shortness of breath. EXAM: PORTABLE CHEST 1 VIEW COMPARISON:  CT dated 11/25/2015.  Radiographs dated 05/04/2015. FINDINGS: Stable enlarged cardiac silhouette. Increased left pleural fluid and left basilar atelectasis. The pulmonary vasculature and interstitial markings remain prominent. Diffuse osteopenia. IMPRESSION: 1. Moderate-sized left pleural effusion, increased. 2. Mildly increased left basilar atelectasis. 3. Stable cardiomegaly, pulmonary vascular congestion, chronic interstitial lung disease and probable mild interstitial pulmonary edema. Electronically Signed   By: Claudie Revering M.D.   On: 02/04/2016 19:41    Procedures Procedures (including critical care time)  Medications Ordered in ED Medications  acetaminophen (TYLENOL) tablet 1,000 mg (not administered)  ipratropium-albuterol (DUONEB) 0.5-2.5 (3) MG/3ML nebulizer solution 3 mL (3 mLs Nebulization Given 02/04/16 1853)  furosemide (LASIX) injection 40 mg (40 mg Intravenous Given 02/04/16 1854)     Initial Impression / Assessment and Plan / ED Course  I have reviewed the triage vital signs and the nursing notes.  Pertinent labs & imaging results that were available during my care of the patient were reviewed by me and considered in my medical decision making (see chart for details).  Clinical Course    Patient presents with complaints of shortness of breath that appears to be related to a COPD exacerbation. She was given steroids in route along with nebulizer treatments by EMS and here in the ER. Her workup shows a moderate-sized left-sided pleural effusion. She also has a greater oxygen requirement than normal. I feel as though the patient will require admission  for further workup and treatment of her dyspnea. I've spoken with the resident from the teaching service and the patient will be admitted to the internal medicine service under the care of [employer].  Final Clinical Impressions(s) / ED Diagnoses   Final diagnoses:  None    New Prescriptions New Prescriptions   No medications on file     Veryl Speak, MD 02/04/16 2141

## 2016-02-04 NOTE — ED Notes (Addendum)
Attempt to call report x 1. Told nurse will return call shortly.

## 2016-02-05 ENCOUNTER — Inpatient Hospital Stay (HOSPITAL_COMMUNITY): Payer: Commercial Managed Care - HMO

## 2016-02-05 ENCOUNTER — Other Ambulatory Visit: Payer: Self-pay | Admitting: *Deleted

## 2016-02-05 DIAGNOSIS — Z923 Personal history of irradiation: Secondary | ICD-10-CM

## 2016-02-05 DIAGNOSIS — J441 Chronic obstructive pulmonary disease with (acute) exacerbation: Principal | ICD-10-CM

## 2016-02-05 DIAGNOSIS — I2510 Atherosclerotic heart disease of native coronary artery without angina pectoris: Secondary | ICD-10-CM

## 2016-02-05 DIAGNOSIS — E1122 Type 2 diabetes mellitus with diabetic chronic kidney disease: Secondary | ICD-10-CM

## 2016-02-05 DIAGNOSIS — J440 Chronic obstructive pulmonary disease with acute lower respiratory infection: Secondary | ICD-10-CM | POA: Diagnosis present

## 2016-02-05 DIAGNOSIS — K219 Gastro-esophageal reflux disease without esophagitis: Secondary | ICD-10-CM

## 2016-02-05 DIAGNOSIS — C3492 Malignant neoplasm of unspecified part of left bronchus or lung: Secondary | ICD-10-CM

## 2016-02-05 DIAGNOSIS — Z888 Allergy status to other drugs, medicaments and biological substances status: Secondary | ICD-10-CM

## 2016-02-05 DIAGNOSIS — E785 Hyperlipidemia, unspecified: Secondary | ICD-10-CM

## 2016-02-05 DIAGNOSIS — Z87891 Personal history of nicotine dependence: Secondary | ICD-10-CM

## 2016-02-05 DIAGNOSIS — N179 Acute kidney failure, unspecified: Secondary | ICD-10-CM

## 2016-02-05 DIAGNOSIS — Z8249 Family history of ischemic heart disease and other diseases of the circulatory system: Secondary | ICD-10-CM

## 2016-02-05 DIAGNOSIS — J9611 Chronic respiratory failure with hypoxia: Secondary | ICD-10-CM

## 2016-02-05 DIAGNOSIS — J90 Pleural effusion, not elsewhere classified: Secondary | ICD-10-CM | POA: Diagnosis present

## 2016-02-05 DIAGNOSIS — I5032 Chronic diastolic (congestive) heart failure: Secondary | ICD-10-CM

## 2016-02-05 DIAGNOSIS — R748 Abnormal levels of other serum enzymes: Secondary | ICD-10-CM | POA: Diagnosis present

## 2016-02-05 DIAGNOSIS — E1159 Type 2 diabetes mellitus with other circulatory complications: Secondary | ICD-10-CM

## 2016-02-05 DIAGNOSIS — Z9981 Dependence on supplemental oxygen: Secondary | ICD-10-CM

## 2016-02-05 DIAGNOSIS — I700 Atherosclerosis of aorta: Secondary | ICD-10-CM

## 2016-02-05 DIAGNOSIS — N183 Chronic kidney disease, stage 3 (moderate): Secondary | ICD-10-CM

## 2016-02-05 LAB — COMPREHENSIVE METABOLIC PANEL
ALK PHOS: 125 U/L (ref 38–126)
ALT: 9 U/L — AB (ref 14–54)
ANION GAP: 11 (ref 5–15)
AST: 13 U/L — ABNORMAL LOW (ref 15–41)
Albumin: 3.2 g/dL — ABNORMAL LOW (ref 3.5–5.0)
BILIRUBIN TOTAL: 0.5 mg/dL (ref 0.3–1.2)
BUN: 39 mg/dL — ABNORMAL HIGH (ref 6–20)
CALCIUM: 9 mg/dL (ref 8.9–10.3)
CO2: 28 mmol/L (ref 22–32)
CREATININE: 3.15 mg/dL — AB (ref 0.44–1.00)
Chloride: 98 mmol/L — ABNORMAL LOW (ref 101–111)
GFR calc non Af Amer: 15 mL/min — ABNORMAL LOW (ref >60)
GFR, EST AFRICAN AMERICAN: 17 mL/min — AB (ref >60)
Glucose, Bld: 160 mg/dL — ABNORMAL HIGH (ref 65–99)
Potassium: 3.9 mmol/L (ref 3.5–5.1)
Sodium: 137 mmol/L (ref 135–145)
TOTAL PROTEIN: 7.3 g/dL (ref 6.5–8.1)

## 2016-02-05 LAB — CBC
HCT: 30.8 % — ABNORMAL LOW (ref 36.0–46.0)
Hemoglobin: 9.9 g/dL — ABNORMAL LOW (ref 12.0–15.0)
MCH: 29.5 pg (ref 26.0–34.0)
MCHC: 32.1 g/dL (ref 30.0–36.0)
MCV: 91.7 fL (ref 78.0–100.0)
PLATELETS: 294 10*3/uL (ref 150–400)
RBC: 3.36 MIL/uL — AB (ref 3.87–5.11)
RDW: 13.9 % (ref 11.5–15.5)
WBC: 11.9 10*3/uL — ABNORMAL HIGH (ref 4.0–10.5)

## 2016-02-05 LAB — BASIC METABOLIC PANEL
Anion gap: 11 (ref 5–15)
Anion gap: 10 (ref 5–15)
BUN: 31 mg/dL — AB (ref 6–20)
BUN: 34 mg/dL — AB (ref 6–20)
CALCIUM: 8.8 mg/dL — AB (ref 8.9–10.3)
CALCIUM: 8.7 mg/dL — AB (ref 8.9–10.3)
CHLORIDE: 94 mmol/L — AB (ref 101–111)
CO2: 30 mmol/L (ref 22–32)
CO2: 29 mmol/L (ref 22–32)
CREATININE: 3.59 mg/dL — AB (ref 0.44–1.00)
CREATININE: 3.35 mg/dL — AB (ref 0.44–1.00)
Chloride: 97 mmol/L — ABNORMAL LOW (ref 101–111)
GFR calc Af Amer: 16 mL/min — ABNORMAL LOW (ref >60)
GFR calc non Af Amer: 13 mL/min — ABNORMAL LOW (ref >60)
GFR, EST AFRICAN AMERICAN: 15 mL/min — AB (ref >60)
GFR, EST NON AFRICAN AMERICAN: 14 mL/min — AB (ref >60)
GLUCOSE: 272 mg/dL — AB (ref 65–99)
GLUCOSE: 231 mg/dL — AB (ref 65–99)
Potassium: 3.9 mmol/L (ref 3.5–5.1)
Potassium: 4 mmol/L (ref 3.5–5.1)
Sodium: 135 mmol/L (ref 135–145)
Sodium: 136 mmol/L (ref 135–145)

## 2016-02-05 LAB — GLUCOSE, CAPILLARY
GLUCOSE-CAPILLARY: 250 mg/dL — AB (ref 65–99)
GLUCOSE-CAPILLARY: 153 mg/dL — AB (ref 65–99)
GLUCOSE-CAPILLARY: 165 mg/dL — AB (ref 65–99)
Glucose-Capillary: 212 mg/dL — ABNORMAL HIGH (ref 65–99)
Glucose-Capillary: 160 mg/dL — ABNORMAL HIGH (ref 65–99)

## 2016-02-05 LAB — LACTATE DEHYDROGENASE: LDH: 148 U/L (ref 98–192)

## 2016-02-05 IMAGING — CR DG CHEST 2V
1 series · 1 of 1 positions shown · non-contrast
Comparison: 06/30/2013

CLINICAL DATA: COPD, sleep apnea

EXAM:
CHEST  2 VIEW

[view not recorded]
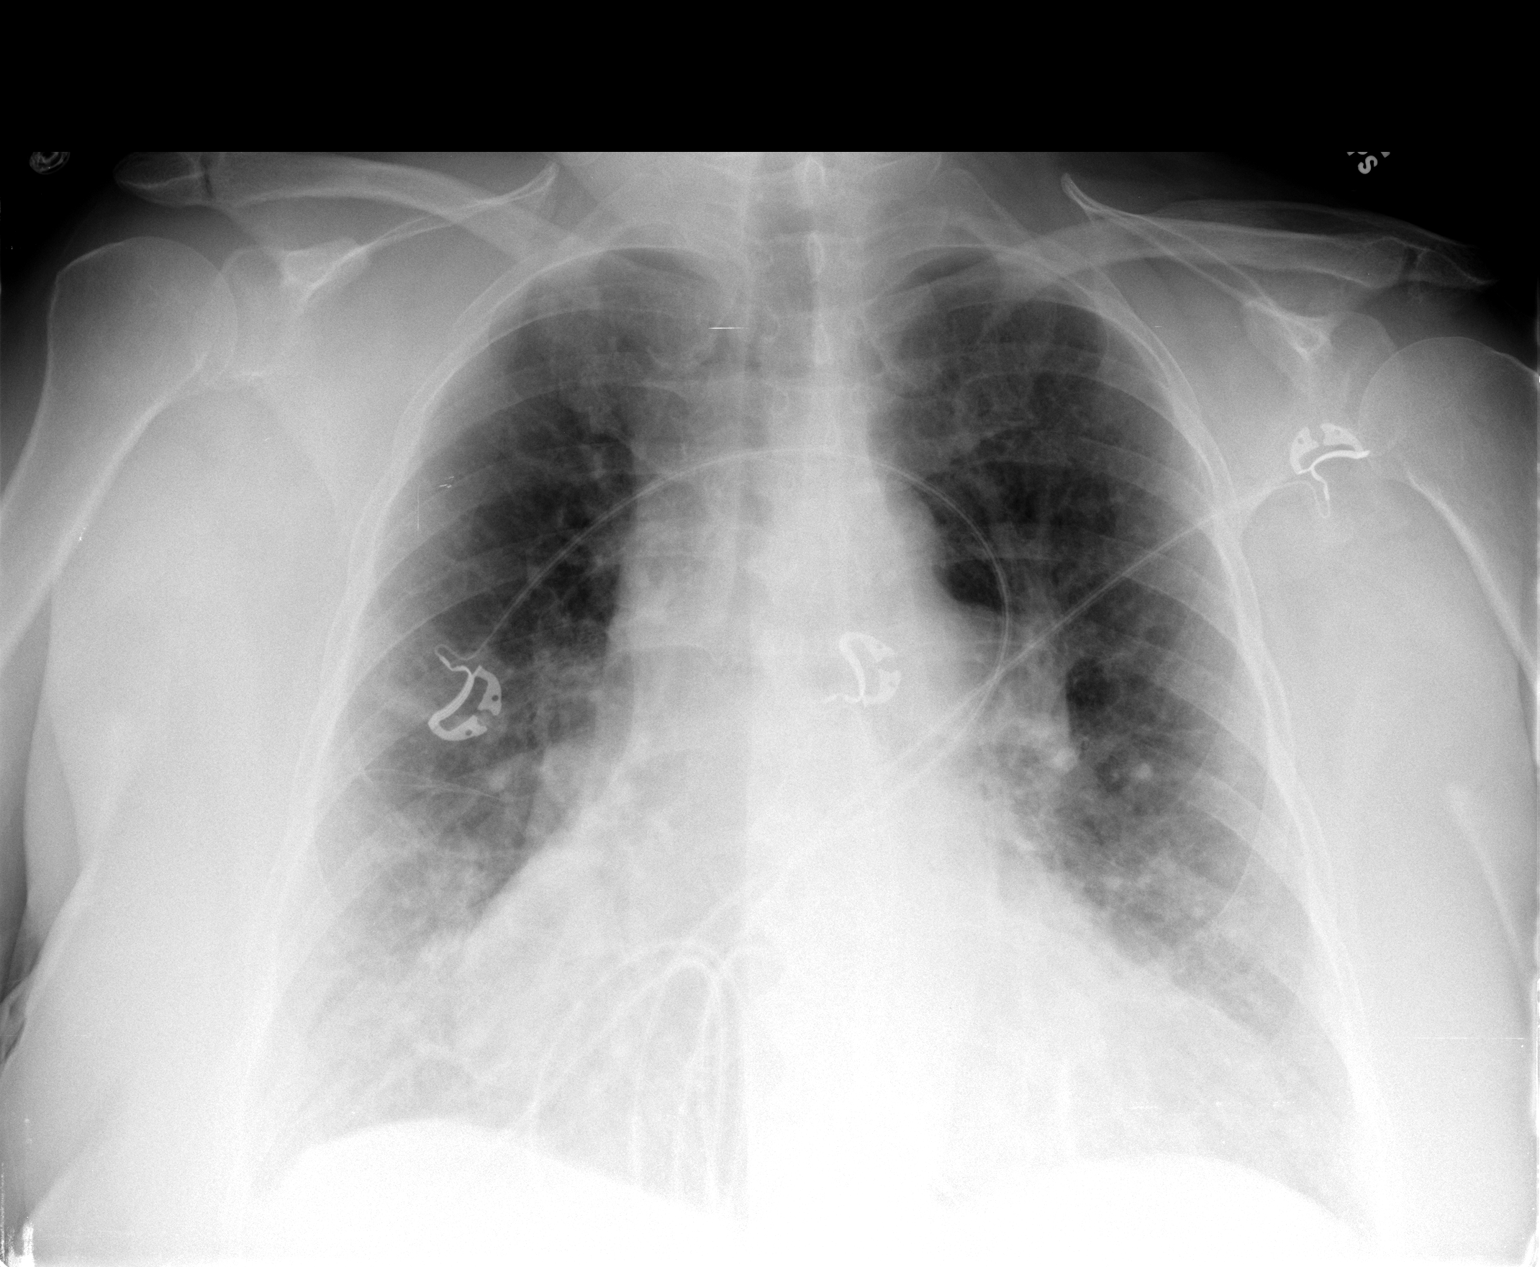

[1 of 1 positions shown; findings below may reference images not displayed]

FINDINGS: Cardiomediastinal silhouette is stable. Persistent patchy right
basilar atelectasis or infiltrate. No pulmonary edema.
IMPRESSION: Cardiomegaly. Persistent patchy right basilar atelectasis or
infiltrate. No pulmonary edema.

## 2016-02-05 MED ORDER — PREDNISONE 20 MG PO TABS
40.0000 mg | ORAL_TABLET | Freq: Every day | ORAL | Status: DC
  Administered 2016-02-05 – 2016-02-07 (×3): 40 mg via ORAL
  Filled 2016-02-05 (×3): qty 2

## 2016-02-05 MED ORDER — INSULIN ASPART 100 UNIT/ML ~~LOC~~ SOLN
0.0000 [IU] | Freq: Every day | SUBCUTANEOUS | Status: DC
  Administered 2016-02-05: 2 [IU] via SUBCUTANEOUS

## 2016-02-05 MED ORDER — LEVOFLOXACIN IN D5W 750 MG/150ML IV SOLN
750.0000 mg | Freq: Once | INTRAVENOUS | Status: AC
  Administered 2016-02-05: 750 mg via INTRAVENOUS
  Filled 2016-02-05: qty 150

## 2016-02-05 MED ORDER — ACETAMINOPHEN 325 MG PO TABS: 650.0000 mg | ORAL_TABLET | Freq: Four times a day (QID) | ORAL | Status: DC | PRN

## 2016-02-05 MED ORDER — IPRATROPIUM-ALBUTEROL 0.5-2.5 (3) MG/3ML IN SOLN
3.0000 mL | Freq: Four times a day (QID) | RESPIRATORY_TRACT | Status: DC
  Administered 2016-02-06 – 2016-02-07 (×6): 3 mL via RESPIRATORY_TRACT
  Filled 2016-02-05 (×6): qty 3

## 2016-02-05 MED ORDER — INSULIN ASPART 100 UNIT/ML ~~LOC~~ SOLN
0.0000 [IU] | Freq: Three times a day (TID) | SUBCUTANEOUS | Status: DC
  Administered 2016-02-05 (×2): 2 [IU] via SUBCUTANEOUS
  Administered 2016-02-05: 3 [IU] via SUBCUTANEOUS
  Administered 2016-02-06: 1 [IU] via SUBCUTANEOUS
  Administered 2016-02-06: 3 [IU] via SUBCUTANEOUS
  Administered 2016-02-07: 1 [IU] via SUBCUTANEOUS

## 2016-02-05 MED ORDER — PANTOPRAZOLE SODIUM 40 MG PO TBEC
40.0000 mg | DELAYED_RELEASE_TABLET | Freq: Every day | ORAL | Status: DC
  Administered 2016-02-05 – 2016-02-07 (×3): 40 mg via ORAL
  Filled 2016-02-05 (×3): qty 1

## 2016-02-05 MED ORDER — ALBUTEROL SULFATE (2.5 MG/3ML) 0.083% IN NEBU: 2.5000 mg | INHALATION_SOLUTION | Freq: Four times a day (QID) | RESPIRATORY_TRACT | Status: DC | PRN

## 2016-02-05 MED ORDER — LEVOFLOXACIN 500 MG PO TABS
500.0000 mg | ORAL_TABLET | ORAL | Status: DC
  Administered 2016-02-07: 500 mg via ORAL
  Filled 2016-02-05: qty 1

## 2016-02-05 MED ORDER — BUPROPION HCL ER (SR) 150 MG PO TB12
150.0000 mg | ORAL_TABLET | Freq: Two times a day (BID) | ORAL | Status: DC
  Administered 2016-02-05 – 2016-02-07 (×6): 150 mg via ORAL
  Filled 2016-02-05 (×6): qty 1

## 2016-02-05 MED ORDER — ROSUVASTATIN CALCIUM 20 MG PO TABS
20.0000 mg | ORAL_TABLET | Freq: Every day | ORAL | Status: DC
  Administered 2016-02-05 – 2016-02-07 (×3): 20 mg via ORAL
  Filled 2016-02-05 (×3): qty 1

## 2016-02-05 MED ORDER — HEPARIN SODIUM (PORCINE) 5000 UNIT/ML IJ SOLN
5000.0000 [IU] | Freq: Three times a day (TID) | INTRAMUSCULAR | Status: DC
  Administered 2016-02-05: 5000 [IU] via SUBCUTANEOUS
  Filled 2016-02-05: qty 1

## 2016-02-05 MED ORDER — SODIUM CHLORIDE 0.9 % IV SOLN
INTRAVENOUS | Status: AC
  Administered 2016-02-05: 01:00:00 via INTRAVENOUS

## 2016-02-05 MED ORDER — ACETAMINOPHEN 650 MG RE SUPP: 650.0000 mg | Freq: Four times a day (QID) | RECTAL | Status: DC | PRN

## 2016-02-05 MED ORDER — LEVOFLOXACIN IN D5W 500 MG/100ML IV SOLN: 500.0000 mg | INTRAVENOUS | Status: DC

## 2016-02-05 MED ORDER — IPRATROPIUM-ALBUTEROL 0.5-2.5 (3) MG/3ML IN SOLN
3.0000 mL | Freq: Four times a day (QID) | RESPIRATORY_TRACT | Status: DC
  Administered 2016-02-05 (×4): 3 mL via RESPIRATORY_TRACT
  Filled 2016-02-05 (×4): qty 3

## 2016-02-05 MED ORDER — ASPIRIN 81 MG PO CHEW
81.0000 mg | CHEWABLE_TABLET | Freq: Every day | ORAL | Status: DC
  Administered 2016-02-05 – 2016-02-06 (×3): 81 mg via ORAL
  Filled 2016-02-05 (×3): qty 1

## 2016-02-05 MED ORDER — MOMETASONE FURO-FORMOTEROL FUM 200-5 MCG/ACT IN AERO
2.0000 | INHALATION_SPRAY | Freq: Two times a day (BID) | RESPIRATORY_TRACT | Status: DC
  Administered 2016-02-05 – 2016-02-07 (×5): 2 via RESPIRATORY_TRACT
  Filled 2016-02-05: qty 8.8

## 2016-02-05 MED ORDER — GUAIFENESIN ER 600 MG PO TB12
600.0000 mg | ORAL_TABLET | Freq: Two times a day (BID) | ORAL | Status: DC
  Administered 2016-02-05 – 2016-02-07 (×5): 600 mg via ORAL
  Filled 2016-02-05 (×5): qty 1

## 2016-02-05 MED ORDER — VITAMIN B-12 1000 MCG PO TABS
1000.0000 ug | ORAL_TABLET | Freq: Every day | ORAL | Status: DC
  Administered 2016-02-05 – 2016-02-07 (×3): 1000 ug via ORAL
  Filled 2016-02-05 (×3): qty 1

## 2016-02-05 NOTE — Discharge Summary (Signed)
Name: [name] MRN: [id number] DOB: 1955-09-13 60 y.o. PCP: Oval Linsey, MD  Date of Admission: 02/04/2016  6:39 PM Date of Discharge: 02/07/2016 Attending Physician: Dr. Heber Sycamore  Discharge Diagnosis: 1. COPD exacerbation Principal Problem:   COPD with exacerbation ([place]) Active Problems:   Coronary artery disease involving native heart without angina pectoris   Severe chronic obstructive pulmonary disease (HCC)   Gastroesophageal reflux disease   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 (HCC)   Hyperlipidemia   Chronic diastolic heart failure (HCC)   Primary adenocarcinoma of left lung (HCC)   Acute kidney injury (HCC)   CKD (chronic kidney disease) stage 3, GFR 30-59 ml/min   Bronchitis, chronic obstructive w acute bronchitis (HCC)   Elevated alkaline phosphatase level   Pleural effusion on left   Discharge Medications:   Medication List    TAKE these medications   albuterol (2.5 MG/3ML) 0.083% nebulizer solution Commonly known as:  PROVENTIL Take 3 mLs (2.5 mg total) by nebulization every 6 (six) hours as needed for shortness of breath.   albuterol 108 (90 Base) MCG/ACT inhaler Commonly known as:  VENTOLIN HFA Inhale 1-2 puffs into the lungs every 6 (six) hours as needed for shortness of breath.   aspirin 81 MG chewable tablet Chew 1 tablet (81 mg total) by mouth at bedtime.   buPROPion 150 MG 12 hr tablet Commonly known as:  WELLBUTRIN SR Take 1 tablet (150 mg total) by mouth 2 (two) times daily. Start with 150 mg once daily for 3 days   Fluticasone-Salmeterol 250-50 MCG/DOSE Aepb Commonly known as:  ADVAIR DISKUS Inhale 1 puff into the lungs 2 (two) times daily.   furosemide 40 MG tablet Commonly known as:  LASIX Take 80 mg by mouth daily.   gabapentin 300 MG capsule Commonly known as:  NEURONTIN Take 1 capsule (300 mg total) by mouth 3 (three) times daily. What changed:  how much to take  when to take this  additional  instructions   levofloxacin 500 MG tablet Commonly known as:  LEVAQUIN Take 1 tablet on November 7, then Nov 9th then Nov 11.   lisinopril 5 MG tablet Commonly known as:  PRINIVIL,ZESTRIL Take 1 tablet (5 mg total) by mouth at bedtime.   metFORMIN 500 MG tablet Commonly known as:  GLUCOPHAGE Take 1 tablet (500 mg total) by mouth 2 (two) times daily with a meal.   metolazone 2.5 MG tablet Commonly known as:  ZAROXOLYN TAKE 1 TABLET BY MOUTH AS NEEDED(FOR 5 LBS WEIGHT GAIN)   OXYGEN Inhale 4.5 L into the lungs continuous.   pantoprazole 40 MG tablet Commonly known as:  PROTONIX Take 1 tablet (40 mg total) by mouth daily.   potassium chloride SA 20 MEQ tablet Commonly known as:  K-DUR,KLOR-CON Take 1 tablet (20 mEq total) by mouth daily.   predniSONE 20 MG tablet Commonly known as:  DELTASONE Take 2 tablets (40 mg total) by mouth daily with breakfast. Until Nov 7   rosuvastatin 20 MG tablet Commonly known as:  CRESTOR Take 1 tablet (20 mg total) by mouth daily.   tiotropium 18 MCG inhalation capsule Commonly known as:  SPIRIVA HANDIHALER Place 1 capsule (18 mcg total) into inhaler and inhale daily.   vitamin B-12 1000 MCG tablet Commonly known as:  CYANOCOBALAMIN Take 1 tablet (1,000 mcg total) by mouth daily.       Disposition and follow-up:   [name] was discharged from [facility] in Good  condition.  At the hospital follow up visit please address:  1.   --Any change in her breathing? Are her symptoms being adequately controlled? --any chest pain at site of thoracentesis?  --did she complete her course of prednisone (end date 11/7) --did she complete her course of levoquin (end date 11/11) --please start vit D supplementation   2.  Labs / imaging needed at time of follow-up: none  3.  Pending labs/ test needing follow-up: CXR if exam concerning for recurrent effusion.  Follow-up Appointments: Follow-up Information    Karren Cobble, MD.   Specialty:  Internal Medicine Contact information: 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 This was similar to her prior exacerbations, per patient. CXR was not concerning for pneumonia. Patient received solumedrol en route to hospital; was placed on levaquin, scheduled duonebs and continued home advair. She was started on prednisone for 5 days. At admission, patient had increased oxygen use to 4.5L from 4L [place] at home. On discharge, patient was back to her baseline. She was discharged with home nebulizer and inhaler regimen, levoquin (end date 11/11) and prednisone 88m (end date 11/7).  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 Patient follows with Dr. MJulien Nordmannof Oncology with most recent visit 8/17. She has presented now with worsening left pleural effusion and thoracentesis was performed for evaluation of fluid for possible recurrence of cancer. Gram stain was negative for WBC.  Cytology showing reactive mesothelial cells. LDH of thoracentesis was 132, cell count was 778 with normal neutrophil percentage. Protein 4.8. About 530 cc of bloody fluid was aspirated. Patient showed symptomatic relief with thoracentesis; post-procedure CXR was negative for pneumothorax. She will follow up outpatient for cytology results discussion. She has a follow up with Dr. MJulien Nordmannin Jan 2018 for CT. If recurrent effusion, may need thoracentesis with repeat cytology to evaluate for recurrence of carcinoma.  AKI on CKD3:  Patient admitted with Cr 3.1 (from 1.3 at baseline). She has had poor PO intake in few days prior to hospitalization due to feeling ill, but has continued to take her lasix - this was seen as probably etiology of her acute decline in renal function. Her metformin and lisinopril were held and she was gently re-hydrated. Her creatinine was improving throughout her admission.  Chronic Diastolic Congestive Heart Failure: Patient with history of diastolic CHF, with most recent echo in 05/2014 showing EF 50-55%, with LVH and grade 1 diastolic dysfunction. Patient is followed by Dr. BHaroldine Lawsof [facility].  Her dry weight is 245lbs and weight at admission was 238 and clinically she was volume depleted. Her lasix were held and she was gently rehydrated; at discharge she was euvolemic on exam.  Type 2 DM:  Patient with recent HbA1c 6.5% on 8/17 managed with metformin 1041m daily. This was held in setting of AKI and restarted at discharge.  GERD: Patient was continued on home dose of protonix.  Elevated alkaline phosphatase:  Patient has had consistently elevated alk phos since sept 2016. Etiology likely vitamin D def vs secondary hyperparathyroidism in setting of CKD 3. Her GGT was normal and vitamin D returned as low to 5.2 after patient was discharged. She will need to be started on Vit D supplementation outpatient.  Discharge Vitals:   BP 90/75 (BP Location: Right Arm)    Pulse 75   Temp 98.2 F (36.8 C) (Oral)   Resp 18   Ht _0  (1.651 m)   Wt 237 lb 9.6 oz (107.8 kg)   SpO2 96%   BMI 39.54 kg/m   Pertinent Labs, Studies, and Procedures:  CBC    Component Value Date/Time   WBC 11.7 (H) 02/06/2016 0503   RBC 3.29 (L) 02/06/2016 0503   HGB 9.6 (L) 02/06/2016 0503   HGB 11.1 (L) 11/25/2015 0937   HCT 30.2 (L) 02/06/2016 0503   HCT 34.7 (L) 11/25/2015 0937   PLT 299 02/06/2016 0503   PLT 171 11/25/2015 0937   MCV 91.8 02/06/2016 0503   MCV 91.1 11/25/2015 0937   MCH 29.2 02/06/2016 0503   MCHC 31.8 02/06/2016 0503   RDW 13.9 02/06/2016 0503   RDW 15.0 (H) 11/25/2015 0937   LYMPHSABS 1.1 02/04/2016 1920   LYMPHSABS 0.8 (L) 11/25/2015 0937   MONOABS 0.8 02/04/2016 1920   MONOABS 0.8 11/25/2015 0937   EOSABS 0.6 02/04/2016 1920   EOSABS 0.6 (H) 11/25/2015 0937   BASOSABS 0.0 02/04/2016 1920   BASOSABS 0.0 11/25/2015 0937   BMP Latest Ref Rng & Units 02/07/2016 02/06/2016 02/05/2016  Glucose 65 - 99 mg/dL 116(H) 106(H) 160(H)  BUN 6 - 20 mg/dL 40(H) 37(H) 39(H)  Creatinine 0.44 - 1.00 mg/dL 2.40(H) 2.86(H) 3.15(H)  BUN/Creat Ratio 12 - 28 - - -  Sodium 135 - 145 mmol/L 139 139 137  Potassium 3.5 - 5.1 mmol/L 3.9 4.2 3.9  Chloride 101 - 111 mmol/L 101 100(L) 98(L)  CO2 22 - 32 mmol/L _1 Calcium 8.9 - 10.3 mg/dL 8.7(L) 8.9 9.0   Pleural fluid Cx: No growth - final 02/06/2016  Discharge Instructions: Discharge Instructions    AMB Referral to [facility]    Complete by:  As directed    Reason for consult:  Ongoing follow up COPD   Diagnoses of:  COPD/ Pneumonia   Expected date of contact:  1-3 days (reserved for hospital discharges)   Please assign to community nurse for transition of care calls and assess for home visits. Questions please call:   VNatividad Brood RN BSN [facility]  [phone] mobile phone Toll free office [phone]  Diet - low sodium heart healthy    Complete  by:  As directed    Increase activity slowly    Complete by:  As directed       Signed: GAlphonzo Grieve MD 02/14/2016, 9:50 PM   Pager [phone]

## 2016-02-05 NOTE — Addendum Note (Signed)
Addended by: Virgel Manifold on: 02/05/2016 11:54 AM   Modules accepted: Miquel Dunn

## 2016-02-05 NOTE — Progress Notes (Deleted)
  This encounter was created in error - please disregard.

## 2016-02-05 NOTE — Addendum Note (Signed)
Addended by: Virgel Manifold on: 02/05/2016 11:57 AM   Modules accepted: Miquel Dunn

## 2016-02-05 NOTE — Progress Notes (Signed)
Subjective:   No acute events overnight. She feels she is improved from yesterday, but still having some SOB. Denies chest pain, headaches, abdominal pain. Denies nausea or vomiting.  She understands the plans about thoracentesis and we discussed the results of her chest xray.   Objective:  Vital signs in last 24 hours: Vitals:   02/05/16 0447 02/05/16 0814 02/05/16 0854 02/05/16 1521  BP: (!) 94/50 121/64    Pulse: 72 83    Resp: 18 18    Temp: 97.9 F (36.6 C) 98.5 F (36.9 C)    TempSrc: Oral Oral    SpO2: 96% 95% 95% 97%  Weight:      Height:       General: Vital signs reviewed. Patient in no acute distress, resting in bed HEENT: EOMI, no scleral icterus, has [place] on 4L oxygen Cardiovascular: regular rate, rhythm, no murmur appreciated  Pulmonary/Chest: No wheezing. Mild crackles in left lower lobe. Dullness to percussion over left base.  Abdominal: Soft, non-tender, non-distended, BS + Extremities: No lower extremity edema bilaterally Skin: Warm, dry and intact. Neuro: alert and oriented. No gross deficits.     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 Similar to prior exacerbations. Symptoms improved with  steroids and duoneb treatments.   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 S/p curative radiotherapy, but was too frail for surgical resection.. Patient follows with Dr. Julien Nordmann of Oncology with most recent visit 8/17.  She has presented now with worsening left pleural effusion.   -consulted IR for thoracentesis -cell count, LDH, cytology, albumin, gram stain and culture -CMP at time of the thora    AKI on CKD3: baseline cr of 1.3, now 3.4. Likely pre-renal due to continuing lasix, and poor PO intake  -gently hydration NS 75 cc/hr for 12 hours  -repeat BMET  -holding metformin and lisinopril   Chronic diastolic CHF: Most recent ECHO 2/16 showed EF of 50-55% with LVH and grade 1 diastolic dysfunction.The patient follows with Dr[facility]ine [facility] with most recent visit 9/17. She weighs herself daily with a reported dry weight of 245 lbs. Weight today 238 lbs. She takes Lasix 80 mg daily as well as PRN Metolazone however has not required this as needed medication lately.   -currently receiving some hydration due to her AKI. -will -reassess whether to start lasix based on the fluid response and volume status  Elevated alkaline phosphatase: Persistently elevated since September 2016. Most likely it is Vitamin D deficiency vs secondary hyperparathyroidism, given her existing CKD3.   -checking GGT and  vitamin D inpatient -will defer rest of the work up including  PTH to outpatient   T2DM: A1c of 6.5% in Aug 2017. Takes metformin 1000 mg daily  -on SSI  GERD: -continue protonox daily   Diet: HH IVF: ns @ 75 ml/hr x 12 hrs, caution for volume overload Code Status: full code DVT Prophylaxis: holding heparin for thoracentesis     Dispo: Anticipated discharge in  approximately 3 day(s).   Burgess Estelle, MD 02/05/2016, 4:29 PM Pager: [phone]

## 2016-02-05 NOTE — Patient Outreach (Signed)
[facility] [facility]) Care Management  02/05/2016  [name] Jul 12, 1955 [id number]  Per chart review patient is inpatient. Will refer back to care management assistant to refer as directed by hospital liaision.  [name], RN BSN [place] Management Coordinator [facility] Care Management  [phone]

## 2016-02-06 ENCOUNTER — Inpatient Hospital Stay (HOSPITAL_COMMUNITY): Payer: Commercial Managed Care - HMO

## 2016-02-06 LAB — GLUCOSE, CAPILLARY
GLUCOSE-CAPILLARY: 104 mg/dL — AB (ref 65–99)
GLUCOSE-CAPILLARY: 138 mg/dL — AB (ref 65–99)
Glucose-Capillary: 221 mg/dL — ABNORMAL HIGH (ref 65–99)
Glucose-Capillary: 170 mg/dL — ABNORMAL HIGH (ref 65–99)

## 2016-02-06 LAB — BODY FLUID CELL COUNT WITH DIFFERENTIAL
Eos, Fluid: 16 %
LYMPHS FL: 39 %
MONOCYTE-MACROPHAGE-SEROUS FLUID: 25 % — AB (ref 50–90)
Neutrophil Count, Fluid: 20 % (ref 0–25)
WBC FLUID: 778 uL (ref 0–1000)

## 2016-02-06 LAB — BASIC METABOLIC PANEL
Anion gap: 9 (ref 5–15)
BUN: 37 mg/dL — AB (ref 6–20)
CALCIUM: 8.9 mg/dL (ref 8.9–10.3)
CO2: 30 mmol/L (ref 22–32)
CREATININE: 2.86 mg/dL — AB (ref 0.44–1.00)
Chloride: 100 mmol/L — ABNORMAL LOW (ref 101–111)
GFR calc Af Amer: 19 mL/min — ABNORMAL LOW (ref >60)
GFR, EST NON AFRICAN AMERICAN: 17 mL/min — AB (ref >60)
GLUCOSE: 106 mg/dL — AB (ref 65–99)
POTASSIUM: 4.2 mmol/L (ref 3.5–5.1)
SODIUM: 139 mmol/L (ref 135–145)

## 2016-02-06 LAB — CBC
HCT: 30.2 % — ABNORMAL LOW (ref 36.0–46.0)
Hemoglobin: 9.6 g/dL — ABNORMAL LOW (ref 12.0–15.0)
MCH: 29.2 pg (ref 26.0–34.0)
MCHC: 31.8 g/dL (ref 30.0–36.0)
MCV: 91.8 fL (ref 78.0–100.0)
PLATELETS: 299 10*3/uL (ref 150–400)
RBC: 3.29 MIL/uL — ABNORMAL LOW (ref 3.87–5.11)
RDW: 13.9 % (ref 11.5–15.5)
WBC: 11.7 10*3/uL — AB (ref 4.0–10.5)

## 2016-02-06 LAB — GRAM STAIN: GRAM STAIN: NONE SEEN

## 2016-02-06 LAB — GLUCOSE, SEROUS FLUID: Glucose, Fluid: 146 mg/dL

## 2016-02-06 LAB — LACTATE DEHYDROGENASE, PLEURAL OR PERITONEAL FLUID: LD, Fluid: 132 U/L — ABNORMAL HIGH (ref 3–23)

## 2016-02-06 LAB — PROTEIN, BODY FLUID: TOTAL PROTEIN, FLUID: 4.8 g/dL

## 2016-02-06 LAB — GAMMA GT: GGT: 32 U/L (ref 7–50)

## 2016-02-06 MED ORDER — LORAZEPAM 1 MG PO TABS
1.0000 mg | ORAL_TABLET | Freq: Once | ORAL | Status: AC | PRN
Start: 2016-02-06 — End: 2016-02-06
  Administered 2016-02-06: 1 mg via ORAL
  Filled 2016-02-06: qty 1

## 2016-02-06 MED ORDER — LIDOCAINE HCL (PF) 1 % IJ SOLN
INTRAMUSCULAR | Status: AC
  Filled 2016-02-06: qty 10

## 2016-02-06 MED ORDER — BENZONATATE 100 MG PO CAPS
200.0000 mg | ORAL_CAPSULE | Freq: Two times a day (BID) | ORAL | Status: DC
  Administered 2016-02-06 – 2016-02-07 (×3): 200 mg via ORAL
  Filled 2016-02-06 (×3): qty 2

## 2016-02-06 NOTE — Procedures (Signed)
Successful US guided left thoracentesis. Yielded 530 ml of blood tinged fluid. Pt tolerated procedure well. No immediate complications.  Specimen was sent for labs. CXR ordered.  Sunny Gains S Hibba Schram PA-C 02/06/2016 10:33 AM

## 2016-02-06 NOTE — Progress Notes (Signed)
Patient refused bed alarm. Will continue to monitor patient. 

## 2016-02-06 NOTE — Progress Notes (Signed)
Medicine attending: Clinical history and updated status reviewed with resident physician Dr. Burgess Estelle and I concur with his evaluation and management plan. Thoracentesis is planned to rule out recurrent non-small cell lung cancer. Dyspnea is improved. Creatinine coming down with mild hydration.

## 2016-02-06 NOTE — Progress Notes (Signed)
Subjective:   No acute events overnight. She feels her dyspnea is improved. Denies chest pain, headaches, abdominal pain. Denies nausea or vomiting.   Objective:  Vital signs in last 24 hours: Vitals:   02/05/16 1913 02/06/16 0010 02/06/16 0403 02/06/16 0848  BP: (!) 101/58 (!) 110/39 102/61   Pulse: 86 76 71   Resp: _0 Temp: 98.2 F (36.8 C) 98.1 F (36.7 C) 97.8 F (36.6 C)   TempSrc: Oral Oral Oral   SpO2: 99% 98% 99% 98%  Weight:   238 lb 14.4 oz (108.4 kg)   Height:       General: Vital signs reviewed. Patient in no acute distress, resting in bed HEENT: EOMI, no scleral icterus, Cardiovascular: regular rate, rhythm, no murmur appreciated  Pulmonary/Chest: No wheezing. Mild crackles in left lower lobe. Dullness to percussion over left base.  Abdominal: Soft, non-tender, non-distended, BS + Extremities: No lower extremity edema bilaterally Skin: Warm, dry and intact. Neuro: alert and oriented. No gross deficits.     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 Similar to prior exacerbations. Symptoms improved with steroids and duoneb treatments.   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 S/p curative radiotherapy, but was too frail for surgical resection.. Patient follows with Dr. Julien Nordmann of Oncology with most recent visit 8/17.  She has presented now with worsening left pleural effusion.   -consulted IR for thoracentesis -cell count, LDH, cytology, albumin, gram stain and culture -CMP at time of the thora    AKI on CKD3: baseline cr of 1.3, now 3.1--> 2.86 . Likely pre-renal due to continuing lasix, and poor PO intake  - earlier received total of 900 cc of NS. Would be careful with hydration given her CHF history. She is taking PO intake  -holding metformin and lisinopril   Chronic diastolic CHF: Most recent ECHO 2/16 showed EF of 50-55% with LVH and grade 1 diastolic dysfunction.The patient follows with Dr. Haroldine [facility] with most recent visit 9/17. She weighs herself daily with a reported dry weight of 245 lbs. Weight today 238 lbs. She takes Lasix 80 mg daily as well as PRN Metolazone however has not required this as needed medication lately.   -will hold lasix today. Re-assess tomorrow   Elevated alkaline phosphatase: Persistently elevated since September 2016. Most likely it is Vitamin D deficiency vs secondary hyperparathyroidism, given her existing CKD3.  Her GGT is normal, and vitamin D is pending   -will defer rest of the work up including  PTH to outpatient  T2DM: A1c of 6.5% in Aug 2017. Takes metformin 1000 mg daily. Received 9 units of SSI yesterday   -on SSI  GERD: -continue protonix daily   Diet: HH IVF: none currently  Code Status: full code DVT Prophylaxis: holding heparin for thoracentesis     Dispo: Anticipated discharge in approximately 2 day(s).   Burgess Estelle, MD 02/06/2016, 9:50 AM Pager: [phone]

## 2016-02-07 DIAGNOSIS — Z9889 Other specified postprocedural states: Secondary | ICD-10-CM

## 2016-02-07 LAB — BASIC METABOLIC PANEL
Anion gap: 7 (ref 5–15)
BUN: 40 mg/dL — AB (ref 6–20)
CALCIUM: 8.7 mg/dL — AB (ref 8.9–10.3)
CO2: 31 mmol/L (ref 22–32)
CREATININE: 2.4 mg/dL — AB (ref 0.44–1.00)
Chloride: 101 mmol/L (ref 101–111)
GFR calc Af Amer: 24 mL/min — ABNORMAL LOW (ref >60)
GFR, EST NON AFRICAN AMERICAN: 21 mL/min — AB (ref >60)
GLUCOSE: 116 mg/dL — AB (ref 65–99)
Potassium: 3.9 mmol/L (ref 3.5–5.1)
Sodium: 139 mmol/L (ref 135–145)

## 2016-02-07 LAB — GLUCOSE, CAPILLARY
Glucose-Capillary: 112 mg/dL — ABNORMAL HIGH (ref 65–99)
Glucose-Capillary: 142 mg/dL — ABNORMAL HIGH (ref 65–99)

## 2016-02-07 LAB — PH, BODY FLUID: pH, Body Fluid: 7.4

## 2016-02-07 MED ORDER — PREDNISONE 20 MG PO TABS: 40.0000 mg | ORAL_TABLET | Freq: Every day | ORAL | 0 refills | Status: DC

## 2016-02-07 MED ORDER — LEVOFLOXACIN 500 MG PO TABS: ORAL_TABLET | ORAL | 0 refills | Status: DC

## 2016-02-07 NOTE — Discharge Instructions (Signed)
It was a pleasure taking care of you.  Please take the antibiotic on Nov 7, Nov 9th and Nov 11 Please take the prednisone as instructed until November 7th  Please follow up in our clinic- someone will call to make an appointment. The results of the fluid that was aspirated will be reviewed with you.

## 2016-02-07 NOTE — Progress Notes (Signed)
Subjective:   No acute events overnight. She feels her dyspnea is improved.Underwent thoracentesis yesterday.   Denies nausea or vomiting.   Objective:  Vital signs in last 24 hours: Vitals:   02/07/16 0455 02/07/16 0724 02/07/16 0725 02/07/16 1124  BP: 104/64     Pulse: 69     Resp: 18     Temp: 97.6 F (36.4 C)     TempSrc: Oral     SpO2: 100% 99% 99% 99%  Weight: 237 lb 9.6 oz (107.8 kg)     Height:       General: Vital signs reviewed. Patient in no acute distress, resting in bed Cardiovascular: regular rate, rhythm, no murmur appreciated  Pulmonary/Chest: No wheezing. Mild crackles in left lower lobe. Dullness to percussion over left base. Thoracentesis site clean dry intact.  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 Similar to prior exacerbations. Symptoms improved with steroids and duoneb treatments.   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 S/p curative radiotherapy, but was too frail for surgical resection.. Patient follows with Dr. Julien Nordmann of Oncology with most recent visit 8/17.  She has presented now with worsening left pleural effusion. She underwent thoracentesis yesterday. Gram stain was negative for WBC. Cytology is pending. LDH of thoracentesis was 132, , cell count was 778 with normal neutrophil percentage. Protein 4.8. About 530 cc of bloody fluid was aspirated   -Results will be followed up in outpatient appointment    AKI on CKD3: baseline cr of 1.3, now 3.1-->2.40.  -holding metformin and lisinopril   Chronic diastolic CHF: Most recent ECHO 2/16 showed EF of 50-55% with LVH and grade 1 diastolic dysfunction.The patient follows with Dr. [facility]ine Laws [facility] with most recent visit 9/17. She weighs herself daily with a reported dry weight of 245 lbs. Weight today 238 lbs. She takes Lasix 80 mg daily as well as PRN Metolazone however has not required this as needed medication lately.   -resume lasix on discharge   Elevated alkaline phosphatase: Persistently elevated since September 2016. Most likely it is Vitamin D deficiency vs secondary hyperparathyroidism, given her existing CKD3.  Her GGT is normal, and vitamin D is pending   -will defer rest of the work up including  PTH to outpatient   T2DM: A1c of 6.5% in Aug 2017. Takes metformin 1000 mg daily. Blood sugars have been WNL -  on SSI  GERD: -continue protonix daily   Diet: HH IVF: none currently  Code Status: full code     Dispo: Anticipated discharge today  Burgess Estelle, MD 02/07/2016, 12:21 PM Pager: [phone]

## 2016-02-07 NOTE — Progress Notes (Signed)
Pt given discharge instructions, verbalized understanding. IV removed. Taken out via wheelchair with home 02 4L.

## 2016-02-08 ENCOUNTER — Telehealth: Payer: Self-pay

## 2016-02-08 ENCOUNTER — Other Ambulatory Visit: Payer: Self-pay | Admitting: Internal Medicine

## 2016-02-08 ENCOUNTER — Other Ambulatory Visit: Payer: Self-pay | Admitting: Pharmacist

## 2016-02-08 ENCOUNTER — Other Ambulatory Visit: Payer: Self-pay

## 2016-02-08 DIAGNOSIS — E1151 Type 2 diabetes mellitus with diabetic peripheral angiopathy without gangrene: Secondary | ICD-10-CM

## 2016-02-08 DIAGNOSIS — I700 Atherosclerosis of aorta: Principal | ICD-10-CM

## 2016-02-08 LAB — MISC LABCORP TEST (SEND OUT): Labcorp test code: 88062

## 2016-02-08 LAB — TRIGLYCERIDES, BODY FLUIDS: Triglycerides, Fluid: 34 mg/dL

## 2016-02-08 LAB — VITAMIN D 25 HYDROXY (VIT D DEFICIENCY, FRACTURES): VIT D 25 HYDROXY: 5.2 ng/mL — AB (ref 30.0–100.0)

## 2016-02-08 MED ORDER — GLUCOSE BLOOD VI STRP: ORAL_STRIP | 12 refills | Status: DC

## 2016-02-08 MED ORDER — ACCU-CHEK AVIVA DEVI: 0 refills | Status: DC

## 2016-02-08 NOTE — Telephone Encounter (Signed)
Steroid-Induced Hyperglycemia Prevention and Management [name] is a 60 y.o. female who meets criteria for [facility] quality improvement program (diabetes patient prescribed short course of steroids).  A/P Current Regimen  Patient prescribed prednisone 20 mg daily x 2 days, currently on day 1 of therapy. Patient taking prednisone in the AM  Prednisone indication: COPD exacerbation  Current DM regimen: Metformin 510m BID   Home BG Monitoring  Patient does have a meter at home and does not check BG at home due to affordability issues with test trips. Consulted with Dr. KMaudie Mercuryand plan is to send prescription for AccuCheck Aviva meter/strips to patient's pharmacy (covered by Medicare).   CBGs at home: n/a   CBGs prior to steroid course 231, 272, 160 on 02/05/16 and 106 on 02/06/16 and 116 on 02/07/16 , A1C prior to steroid course 6.5  S/Sx of hyper- or hypoglycemia: fatigue, dizziness, blurry vision (patient needs eye glasses but cannot afford).   Medication Management  Switch prednisone dose to AM not applicable  Additional treatment for BG control is not indicated at this time.  Medication supply Patient will use own medication  Patient Education  Advised patient to monitor BG while on steroid therapy (at least twice daily prior to first 2 meals of the day).  Patient educated about signs/symptoms and advised to contact clinic if hyper- or hypoglycemic.  Patient did  verbalize understanding of information and regimen by repeating back topics discussed.  Follow-up  02/08/16 with PErenest Rasher RN   FDarcella Cheshire11:32 AM 02/08/2016

## 2016-02-08 NOTE — Patient Outreach (Signed)
    Chart review for disposition. Will contact hospital liaison for disposition information.  Plan to contact patient for discharge planning when patient is discharged

## 2016-02-09 ENCOUNTER — Other Ambulatory Visit: Payer: Self-pay

## 2016-02-10 LAB — CHOLESTEROL, BODY FLUID: Cholesterol, Fluid: 69 mg/dL

## 2016-02-11 LAB — CULTURE, BODY FLUID W GRAM STAIN -BOTTLE

## 2016-02-11 LAB — CULTURE, BODY FLUID-BOTTLE: CULTURE: NO GROWTH

## 2016-02-14 NOTE — Telephone Encounter (Signed)
Patient was contacted with Darcella Cheshire, PharmD candidate. I agree with the assessment and plan of care documented.

## 2016-02-15 ENCOUNTER — Other Ambulatory Visit: Payer: Self-pay | Admitting: Internal Medicine

## 2016-02-15 DIAGNOSIS — E1151 Type 2 diabetes mellitus with diabetic peripheral angiopathy without gangrene: Secondary | ICD-10-CM

## 2016-02-15 DIAGNOSIS — E559 Vitamin D deficiency, unspecified: Secondary | ICD-10-CM

## 2016-02-15 DIAGNOSIS — B370 Candidal stomatitis: Secondary | ICD-10-CM

## 2016-02-15 DIAGNOSIS — Z720 Tobacco use: Secondary | ICD-10-CM

## 2016-02-15 DIAGNOSIS — I700 Atherosclerosis of aorta: Principal | ICD-10-CM

## 2016-02-15 MED ORDER — ACCU-CHEK SOFT TOUCH LANCETS MISC: 11 refills | Status: DC

## 2016-02-15 MED ORDER — BUPROPION HCL ER (SR) 150 MG PO TB12: 150.0000 mg | ORAL_TABLET | Freq: Two times a day (BID) | ORAL | 0 refills | Status: DC

## 2016-02-15 MED ORDER — NYSTATIN 100000 UNIT/ML MT SUSP: 5.0000 mL | Freq: Four times a day (QID) | OROMUCOSAL | 1 refills | Status: AC

## 2016-02-15 MED ORDER — VITAMIN D (ERGOCALCIFEROL) 1.25 MG (50000 UNIT) PO CAPS: 50000.0000 [IU] | ORAL_CAPSULE | ORAL | 0 refills | Status: DC

## 2016-02-15 NOTE — Telephone Encounter (Signed)
I called [name] to discuss the vitamin D replacement for her deficiency discovered while an inpatient.  She will be given 50,000 units of ergocalciferol Q week for 8 weeks and then reassessed.  If replete she will then be converted to 800 units BID for maintenance.  She needed a refill on her lancets and this was completed.  She states she has stopped smoking on the Wellbutrin but is asking for another 3 month course to help her maintain abstinence.  This was provided.  Finally, she developed oral candidiasis after the prednisone she was prescribed as an inpatient.  She has had this before and it responds to nystatin swish and swallow.  She was provided a prescription for 500,000 units every 6 hours for 3 days.

## 2016-02-16 ENCOUNTER — Other Ambulatory Visit: Payer: Self-pay

## 2016-02-16 NOTE — Patient Outreach (Signed)
[place] [facility]) Care Management  02/16/2016  [name] 08-31-1955 [id number]    Telephone call to patient to reschedule home visit scheduled for today. Patient and this RNCM agreed on visit later this month for community care coordination

## 2016-02-19 ENCOUNTER — Other Ambulatory Visit: Payer: Self-pay

## 2016-02-19 NOTE — Patient Outreach (Signed)
[place] [facility]) Care Management   02/19/2016  [name] [birth year] [id number]  [name] is an 60 y.o. female  Subjective:  I have a plan I follow when I start to get in trouble with my breathing. I have been told for years I was boarderline diabetic, my doctor told me in August I was a diabetic.  Objective:   ROS  Very pleasant older lady with nasal cannula in place. Apartment in rent in complex, very neat.  Physical Exam   ROS  Encounter Medications:   Outpatient Encounter Prescriptions as of 02/19/2016  Medication Sig  . albuterol (PROVENTIL) (2.5 MG/3ML) 0.083% nebulizer solution Take 3 mLs (2.5 mg total) by nebulization every 6 (six) hours as needed for shortness of breath.  Marland Kitchen albuterol (VENTOLIN HFA) 108 (90 Base) MCG/ACT inhaler Inhale 1-2 puffs into the lungs every 6 (six) hours as needed for shortness of breath.  Marland Kitchen aspirin 81 MG chewable tablet Chew 1 tablet (81 mg total) by mouth at bedtime.  . Blood Glucose Monitoring Suppl (ACCU-CHEK AVIVA) device Use 2-3 times daily, ICD E11.51, non-insulin requiring  . buPROPion (WELLBUTRIN SR) 150 MG 12 hr tablet Take 1 tablet (150 mg total) by mouth 2 (two) times daily.  . Fluticasone-Salmeterol (ADVAIR DISKUS) 250-50 MCG/DOSE AEPB Inhale 1 puff into the lungs 2 (two) times daily.  . furosemide (LASIX) 40 MG tablet Take 80 mg by mouth daily.  Marland Kitchen gabapentin (NEURONTIN) 300 MG capsule Take 1 capsule (300 mg total) by mouth 3 (three) times daily. (Patient taking differently: Take 300-600 mg by mouth at bedtime. Take 1-2 caps depending on pain)  . glucose blood (ACCU-CHEK AVIVA PLUS) test strip Use 2 to 3 times daily to check blood sugar. diag code E11.51. Non insulin dependent  . Lancets (ACCU-CHEK SOFT TOUCH) lancets Use as instructed  . levofloxacin (LEVAQUIN) 500 MG tablet Take 1 tablet on November 7, then Nov 9th then Nov 11.  . lisinopril (PRINIVIL,ZESTRIL) 5 MG tablet Take 1 tablet (5 mg total) by mouth at  bedtime.  . metFORMIN (GLUCOPHAGE) 500 MG tablet Take 1 tablet (500 mg total) by mouth 2 (two) times daily with a meal.  . metolazone (ZAROXOLYN) 2.5 MG tablet TAKE 1 TABLET BY MOUTH AS NEEDED(FOR 5 LBS WEIGHT GAIN)  . OXYGEN Inhale 4.5 L into the lungs continuous.   . pantoprazole (PROTONIX) 40 MG tablet Take 1 tablet (40 mg total) by mouth daily.  . potassium chloride SA (K-DUR,KLOR-CON) 20 MEQ tablet Take 1 tablet (20 mEq total) by mouth daily.  . predniSONE (DELTASONE) 20 MG tablet Take 2 tablets (40 mg total) by mouth daily with breakfast. Until Nov 7  . rosuvastatin (CRESTOR) 20 MG tablet Take 1 tablet (20 mg total) by mouth daily.  Marland Kitchen tiotropium (SPIRIVA HANDIHALER) 18 MCG inhalation capsule Place 1 capsule (18 mcg total) into inhaler and inhale daily.  . vitamin B-12 (CYANOCOBALAMIN) 1000 MCG tablet Take 1 tablet (1,000 mcg total) by mouth daily.  . Vitamin D, Ergocalciferol, (DRISDOL) 50000 units CAPS capsule Take 1 capsule (50,000 Units total) by mouth every 7 (seven) days.   No facility-administered encounter medications on file as of 02/19/2016.     Functional Status:   In your present state of health, do you have any difficulty performing the following activities: 02/19/2016 02/05/2016  Hearing? N N  Vision? Y N  Difficulty concentrating or making decisions? N N  Walking or climbing stairs? Y Y  Dressing or bathing? N N  Doing  errands, shopping? [name]  Preparing Food and eating ? N -  Using the Toilet? N -  In the past six months, have you accidently leaked urine? Y -  Do you have problems with loss of bowel control? N -  Managing your Medications? N -  Managing your Finances? N -  Housekeeping or managing your Housekeeping? Y -  Some recent data might be hidden    Fall/Depression Screening:    PHQ 2/9 Scores 02/19/2016 11/20/2015 09/10/2015 07/07/2015 05/27/2015 05/04/2015 04/16/2015  PHQ - 2 Score 0 0 0 0 _0 PHQ- 9 Score - - - - - - -   THN CM Care Plan Problem One    Flowsheet Row Most Recent Value  Care Plan Problem One  Patient had a recent hospitalization for COPD exacerbation  Role Documenting the Problem One  Care Management [place] for Problem One  Active  THN Long Term Goal (31-90 days)  Patient will have no hospitalization in the next 31 days  THN Long Term Goal Start Date  02/19/16  Interventions for Problem One Long Term Goal  Initial home visit   THN CM Short Term Goal #1 (0-30 days)  In the next 14 days, patient will meet with [facility] RNCM for COPD education  [facility] CM Short Term Goal #1 Start Date  02/19/16  Interventions for Short Term Goal #1  Educational needs assess during the intial home viist.      . Fall Risk  02/19/2016 11/20/2015 09/10/2015 07/07/2015 05/27/2015  Falls in the past year? Yes No Yes Yes Yes  Number falls in past yr: 2 or more - 1 2 or more 2 or more  Injury with Fall? Yes - No (No Data) Yes  Risk Factor Category  High Fall Risk - - High Fall Risk High Fall Risk  Risk for fall due to : History of fall(s);Impaired balance/gait;Impaired mobility;Medication side effect Other (Comment) Impaired vision;Medication side effect;History of fall(s) History of fall(s);Medication side effect History of fall(s);Medication side effect  Risk for fall due to (comments): - home O2 - - -  Follow up Follow up appointment - Falls prevention discussed Education provided Education provided     [facility] CM Care Plan Problem One   Flowsheet Row Most Recent Value  Care Plan Problem One  Patient had a recent hospitalization for COPD exacerbation  Role Documenting the Problem One  Care Management Coordinator  Care Plan for Problem One  Active  THN Long Term Goal (31-90 days)  Patient will have no hospitalization in the next 31 days  THN Long Term Goal Start Date  02/19/16  Interventions for Problem One Long Term Goal  Initial home visit   THN CM Short Term Goal #1 (0-30 days)  In the next 14 days, patient will meet with [facility] RNCM for COPD  education  [facility] CM Short Term Goal #1 Start Date  02/19/16  Interventions for Short Term Goal #1  Educational needs assess during the intial home viist.      Assessment:   Patient and this RNCM reviewed previously reviewed EMMI videos on COPD, Diabetes and heart failure. Patient states she understands the heart failure and COPD action plan.  Plan:   Telephone contact within next 30 days for community care coordination.

## 2016-02-23 ENCOUNTER — Other Ambulatory Visit: Payer: Self-pay

## 2016-02-23 NOTE — Patient Outreach (Signed)
[place] [facility]) Care Management  02/23/2016   MERRELL RETTINGER 07-27-1955 [id number]  Subjective:  I am doing okay, kind of shaky today but okay.  Objective:  Telephonic encounter  Current Medications:  Current Outpatient Prescriptions  Medication Sig Dispense Refill  . albuterol (PROVENTIL) (2.5 MG/3ML) 0.083% nebulizer solution Take 3 mLs (2.5 mg total) by nebulization every 6 (six) hours as needed for shortness of breath. 360 mL 3  . albuterol (VENTOLIN HFA) 108 (90 Base) MCG/ACT inhaler Inhale 1-2 puffs into the lungs every 6 (six) hours as needed for shortness of breath. 18 g 11  . aspirin 81 MG chewable tablet Chew 1 tablet (81 mg total) by mouth at bedtime. 90 tablet 3  . Blood Glucose Monitoring Suppl (ACCU-CHEK AVIVA) device Use 2-3 times daily, ICD E11.51, non-insulin requiring 1 each 0  . buPROPion (WELLBUTRIN SR) 150 MG 12 hr tablet Take 1 tablet (150 mg total) by mouth 2 (two) times daily. 180 tablet 0  . Fluticasone-Salmeterol (ADVAIR DISKUS) 250-50 MCG/DOSE AEPB Inhale 1 puff into the lungs 2 (two) times daily. 60 each 11  . furosemide (LASIX) 40 MG tablet Take 80 mg by mouth daily.    Marland Kitchen gabapentin (NEURONTIN) 300 MG capsule Take 1 capsule (300 mg total) by mouth 3 (three) times daily. (Patient taking differently: Take 300-600 mg by mouth at bedtime. Take 1-2 caps depending on pain) 270 capsule 3  . glucose blood (ACCU-CHEK AVIVA PLUS) test strip Use 2 to 3 times daily to check blood sugar. diag code E11.51. Non insulin dependent 200 each 3  . Lancets (ACCU-CHEK SOFT TOUCH) lancets Use as instructed 100 each 11  . levofloxacin (LEVAQUIN) 500 MG tablet Take 1 tablet on November 7, then Nov 9th then Nov 11. 3 tablet 0  . lisinopril (PRINIVIL,ZESTRIL) 5 MG tablet Take 1 tablet (5 mg total) by mouth at bedtime. 90 tablet 3  . metFORMIN (GLUCOPHAGE) 500 MG tablet Take 1 tablet (500 mg total) by mouth 2 (two) times daily with a meal. 180 tablet 3  . metolazone  (ZAROXOLYN) 2.5 MG tablet TAKE 1 TABLET BY MOUTH AS NEEDED(FOR 5 LBS WEIGHT GAIN) 90 tablet 3  . OXYGEN Inhale 4.5 L into the lungs continuous.     . pantoprazole (PROTONIX) 40 MG tablet Take 1 tablet (40 mg total) by mouth daily. 90 tablet 3  . potassium chloride SA (K-DUR,KLOR-CON) 20 MEQ tablet Take 1 tablet (20 mEq total) by mouth daily. 90 tablet 3  . predniSONE (DELTASONE) 20 MG tablet Take 2 tablets (40 mg total) by mouth daily with breakfast. Until Nov 7 4 tablet 0  . rosuvastatin (CRESTOR) 20 MG tablet Take 1 tablet (20 mg total) by mouth daily. 90 tablet 3  . tiotropium (SPIRIVA HANDIHALER) 18 MCG inhalation capsule Place 1 capsule (18 mcg total) into inhaler and inhale daily. 90 capsule 3  . vitamin B-12 (CYANOCOBALAMIN) 1000 MCG tablet Take 1 tablet (1,000 mcg total) by mouth daily. 90 tablet 3  . Vitamin D, Ergocalciferol, (DRISDOL) 50000 units CAPS capsule Take 1 capsule (50,000 Units total) by mouth every 7 (seven) days. 8 capsule 0   No current facility-administered medications for this visit.     Functional Status:  In your present state of health, do you have any difficulty performing the following activities: 02/19/2016 02/05/2016  Hearing? N N  Vision? Y N  Difficulty concentrating or making decisions? N N  Walking or climbing stairs? Y Y  Dressing or bathing? N N  Doing errands, shopping? [name]  Preparing Food and eating ? N -  Using the Toilet? N -  In the past six months, have you accidently leaked urine? Y -  Do you have problems with loss of bowel control? N -  Managing your Medications? N -  Managing your Finances? N -  Housekeeping or managing your Housekeeping? Y -  Some recent data might be hidden    Fall/Depression Screening: PHQ 2/9 Scores 02/19/2016 11/20/2015 09/10/2015 07/07/2015 05/27/2015 05/04/2015 04/16/2015  PHQ - 2 Score 0 0 0 0 _0 PHQ- 9 Score - - - - - - -    Assessment:  Patient and RNCM reviewed her case management goals, revised as  indicated. Patient states she looks forward to the holidy, is not experiencing any difficulties at this time. Patient reminded to seek emergency medical assistance if she develops respiratory or need emergency asistance of any kind.   Plan:  Telephone contact within the next 21 days for community care coordination

## 2016-03-01 ENCOUNTER — Ambulatory Visit: Payer: Commercial Managed Care - HMO

## 2016-03-01 ENCOUNTER — Encounter: Payer: Self-pay | Admitting: Internal Medicine

## 2016-03-02 ENCOUNTER — Other Ambulatory Visit: Payer: Self-pay

## 2016-03-03 NOTE — Patient Outreach (Signed)
[name]) Care Management  03/03/2016   [name] [birth year] [id number]  Subjective:  I think this hand shaking is related to anxiety even though I don't feel anxious.   I have not been to the hospital since I was discharged earlier this month.    Objective:  Telephone encounter  Current Medications:  Current Outpatient Prescriptions  Medication Sig Dispense Refill  . albuterol (PROVENTIL) (2.5 MG/3ML) 0.083% nebulizer solution Take 3 mLs (2.5 mg total) by nebulization every 6 (six) hours as needed for shortness of breath. 360 mL 3  . albuterol (VENTOLIN HFA) 108 (90 Base) MCG/ACT inhaler Inhale 1-2 puffs into the lungs every 6 (six) hours as needed for shortness of breath. 18 g 11  . aspirin 81 MG chewable tablet Chew 1 tablet (81 mg total) by mouth at bedtime. 90 tablet 3  . Blood Glucose Monitoring Suppl (ACCU-CHEK AVIVA) device Use 2-3 times daily, ICD E11.51, non-insulin requiring 1 each 0  . buPROPion (WELLBUTRIN SR) 150 MG 12 hr tablet Take 1 tablet (150 mg total) by mouth 2 (two) times daily. 180 tablet 0  . Fluticasone-Salmeterol (ADVAIR DISKUS) 250-50 MCG/DOSE AEPB Inhale 1 puff into the lungs 2 (two) times daily. 60 each 11  . furosemide (LASIX) 40 MG tablet Take 80 mg by mouth daily.    Marland Kitchen gabapentin (NEURONTIN) 300 MG capsule Take 1 capsule (300 mg total) by mouth 3 (three) times daily. (Patient taking differently: Take 300-600 mg by mouth at bedtime. Take 1-2 caps depending on pain) 270 capsule 3  . glucose blood (ACCU-CHEK AVIVA PLUS) test strip Use 2 to 3 times daily to check blood sugar. diag code E11.51. Non insulin dependent 200 each 3  . Lancets (ACCU-CHEK SOFT TOUCH) lancets Use as instructed 100 each 11  . levofloxacin (LEVAQUIN) 500 MG tablet Take 1 tablet on November 7, then Nov 9th then Nov 11. 3 tablet 0  . lisinopril (PRINIVIL,ZESTRIL) 5 MG tablet Take 1 tablet (5 mg total) by mouth at bedtime. 90 tablet 3  . metFORMIN (GLUCOPHAGE) 500 MG  tablet Take 1 tablet (500 mg total) by mouth 2 (two) times daily with a meal. 180 tablet 3  . metolazone (ZAROXOLYN) 2.5 MG tablet TAKE 1 TABLET BY MOUTH AS NEEDED(FOR 5 LBS WEIGHT GAIN) 90 tablet 3  . OXYGEN Inhale 4.5 L into the lungs continuous.     . pantoprazole (PROTONIX) 40 MG tablet Take 1 tablet (40 mg total) by mouth daily. 90 tablet 3  . potassium chloride SA (K-DUR,KLOR-CON) 20 MEQ tablet Take 1 tablet (20 mEq total) by mouth daily. 90 tablet 3  . predniSONE (DELTASONE) 20 MG tablet Take 2 tablets (40 mg total) by mouth daily with breakfast. Until Nov 7 4 tablet 0  . rosuvastatin (CRESTOR) 20 MG tablet Take 1 tablet (20 mg total) by mouth daily. 90 tablet 3  . tiotropium (SPIRIVA HANDIHALER) 18 MCG inhalation capsule Place 1 capsule (18 mcg total) into inhaler and inhale daily. 90 capsule 3  . vitamin B-12 (CYANOCOBALAMIN) 1000 MCG tablet Take 1 tablet (1,000 mcg total) by mouth daily. 90 tablet 3  . Vitamin D, Ergocalciferol, (DRISDOL) 50000 units CAPS capsule Take 1 capsule (50,000 Units total) by mouth every 7 (seven) days. 8 capsule 0   No current facility-administered medications for this visit.     Functional Status:  In your present state of health, do you have any difficulty performing the following activities: 02/19/2016 02/05/2016  Hearing? N N  Vision? [name]  N  Difficulty concentrating or making decisions? N N  Walking or climbing stairs? Y Y  Dressing or bathing? N N  Doing errands, shopping? [name]  Preparing Food and eating ? N -  Using the Toilet? N -  In the past six months, have you accidently leaked urine? Y -  Do you have problems with loss of bowel control? N -  Managing your Medications? N -  Managing your Finances? N -  Housekeeping or managing your Housekeeping? Y -  Some recent data might be hidden    Fall/Depression Screening: PHQ 2/9 Scores 02/19/2016 11/20/2015 09/10/2015 07/07/2015 05/27/2015 05/04/2015 04/16/2015  PHQ - 2 Score 0 0 0 0 _0 PHQ- 9 Score -  - - - - - -     THN CM Care Plan Problem One   Flowsheet Row Most Recent Value  Care Plan Problem One  Patient had a recent hospitalization for COPD exacerbation  Role Documenting the Problem One  Care Management [place] for Problem One  Active  THN Long Term Goal (31-90 days)  Patient will have no hospitalization in the next 31 days  THN Long Term Goal Start Date  02/19/16  Interventions for Problem One Long Term Goal  telephone call to assess patient's progression towards meeting this care management goals.    THN CM Short Term Goal #1 (0-30 days)  In the next 14 days, patient will meet with [name] RNCM for COPD education  [facility] CM Short Term Goal #1 Start Date  02/19/16  [name] CM Short Term Goal #1 Met Date  03/03/16  Interventions for Short Term Goal #1  Educational needs assess during the intial home viist.     [name] CM Care Plan Problem Two   Flowsheet Row Most Recent Value  Care Plan Problem Two  Patient has anxiety  Role Documenting the Problem Two  Care Management Coordinator  THN CM Short Term Goal #1 (0-30 days)  in the next 14 days, patient will make an appointment with her primary caregiver to ddiscuss her anxiety issues,   THN CM Short Term Goal #1 Start Date  03/03/16     Assessment:   Patient has had not acute care admissions since the last assessment.  Patient case management care plan updated to include her making an appointment with primary care physician to discuss her thoughts regarding her shaking hands.  Plan:  Telephone contact in December 2017

## 2016-03-10 ENCOUNTER — Other Ambulatory Visit: Payer: Self-pay

## 2016-03-10 DIAGNOSIS — Z23 Encounter for immunization: Secondary | ICD-10-CM

## 2016-03-10 NOTE — Patient Outreach (Signed)
[place] [facility]) Care Management  03/10/2016   [name] 18-Sep-1955 [id number]  Subjective:  I am feeling pretty good. I have not been back to the hospital since I was released in November.  Objective:  Telephonic encounter  Current Medications:  Current Outpatient Prescriptions  Medication Sig Dispense Refill  . albuterol (PROVENTIL) (2.5 MG/3ML) 0.083% nebulizer solution Take 3 mLs (2.5 mg total) by nebulization every 6 (six) hours as needed for shortness of breath. 360 mL 3  . albuterol (VENTOLIN HFA) 108 (90 Base) MCG/ACT inhaler Inhale 1-2 puffs into the lungs every 6 (six) hours as needed for shortness of breath. 18 g 11  . aspirin 81 MG chewable tablet Chew 1 tablet (81 mg total) by mouth at bedtime. 90 tablet 3  . Blood Glucose Monitoring Suppl (ACCU-CHEK AVIVA) device Use 2-3 times daily, ICD E11.51, non-insulin requiring 1 each 0  . buPROPion (WELLBUTRIN SR) 150 MG 12 hr tablet Take 1 tablet (150 mg total) by mouth 2 (two) times daily. 180 tablet 0  . Fluticasone-Salmeterol (ADVAIR DISKUS) 250-50 MCG/DOSE AEPB Inhale 1 puff into the lungs 2 (two) times daily. 60 each 11  . furosemide (LASIX) 40 MG tablet Take 80 mg by mouth daily.    Marland Kitchen gabapentin (NEURONTIN) 300 MG capsule Take 1 capsule (300 mg total) by mouth 3 (three) times daily. (Patient taking differently: Take 300-600 mg by mouth at bedtime. Take 1-2 caps depending on pain) 270 capsule 3  . glucose blood (ACCU-CHEK AVIVA PLUS) test strip Use 2 to 3 times daily to check blood sugar. diag code E11.51. Non insulin dependent 200 each 3  . Lancets (ACCU-CHEK SOFT TOUCH) lancets Use as instructed 100 each 11  . levofloxacin (LEVAQUIN) 500 MG tablet Take 1 tablet on November 7, then Nov 9th then Nov 11. 3 tablet 0  . lisinopril (PRINIVIL,ZESTRIL) 5 MG tablet Take 1 tablet (5 mg total) by mouth at bedtime. 90 tablet 3  . metFORMIN (GLUCOPHAGE) 500 MG tablet Take 1 tablet (500 mg total) by mouth 2 (two) times  daily with a meal. 180 tablet 3  . metolazone (ZAROXOLYN) 2.5 MG tablet TAKE 1 TABLET BY MOUTH AS NEEDED(FOR 5 LBS WEIGHT GAIN) 90 tablet 3  . OXYGEN Inhale 4.5 L into the lungs continuous.     . pantoprazole (PROTONIX) 40 MG tablet Take 1 tablet (40 mg total) by mouth daily. 90 tablet 3  . potassium chloride SA (K-DUR,KLOR-CON) 20 MEQ tablet Take 1 tablet (20 mEq total) by mouth daily. 90 tablet 3  . predniSONE (DELTASONE) 20 MG tablet Take 2 tablets (40 mg total) by mouth daily with breakfast. Until Nov 7 4 tablet 0  . rosuvastatin (CRESTOR) 20 MG tablet Take 1 tablet (20 mg total) by mouth daily. 90 tablet 3  . tiotropium (SPIRIVA HANDIHALER) 18 MCG inhalation capsule Place 1 capsule (18 mcg total) into inhaler and inhale daily. 90 capsule 3  . vitamin B-12 (CYANOCOBALAMIN) 1000 MCG tablet Take 1 tablet (1,000 mcg total) by mouth daily. 90 tablet 3  . Vitamin D, Ergocalciferol, (DRISDOL) 50000 units CAPS capsule Take 1 capsule (50,000 Units total) by mouth every 7 (seven) days. 8 capsule 0   No current facility-administered medications for this visit.     Functional Status:  In your present state of health, do you have any difficulty performing the following activities: 02/19/2016 02/05/2016  Hearing? N N  Vision? Y N  Difficulty concentrating or making decisions? N N  Walking or climbing stairs?  Y Y  Dressing or bathing? N N  Doing errands, shopping? [name]  Preparing Food and eating ? N -  Using the Toilet? N -  In the past six months, have you accidently leaked urine? Y -  Do you have problems with loss of bowel control? N -  Managing your Medications? N -  Managing your Finances? N -  Housekeeping or managing your Housekeeping? Y -  Some recent data might be hidden    Fall/Depression Screening: PHQ 2/9 Scores 02/19/2016 11/20/2015 09/10/2015 07/07/2015 05/27/2015 05/04/2015 04/16/2015  PHQ - 2 Score 0 0 0 0 _0 PHQ- 9 Score - - - - - - -    Assessment:  Patient is moving towards  reaching her care management goal of no hospitalization from hospitalization in November  Patient states she has been compliant with medications and medical appointment attendance  Plan:   Home visit later thi month for chronic disease education, assessment of community care coordination needs.

## 2016-03-16 ENCOUNTER — Ambulatory Visit: Payer: Self-pay

## 2016-03-22 ENCOUNTER — Other Ambulatory Visit: Payer: Self-pay

## 2016-03-22 NOTE — Patient Outreach (Signed)
[facility]) Care Management  03/22/2016   [name] [birth year] [id number]  Subjective:  I am doing a lot better than when I went to the hospital  Objective:  telephonic encounter  Current Medications:  Current Outpatient Prescriptions  Medication Sig Dispense Refill  . albuterol (PROVENTIL) (2.5 MG/3ML) 0.083% nebulizer solution Take 3 mLs (2.5 mg total) by nebulization every 6 (six) hours as needed for shortness of breath. 360 mL 3  . albuterol (VENTOLIN HFA) 108 (90 Base) MCG/ACT inhaler Inhale 1-2 puffs into the lungs every 6 (six) hours as needed for shortness of breath. 18 g 11  . aspirin 81 MG chewable tablet Chew 1 tablet (81 mg total) by mouth at bedtime. 90 tablet 3  . Blood Glucose Monitoring Suppl (ACCU-CHEK AVIVA) device Use 2-3 times daily, ICD E11.51, non-insulin requiring 1 each 0  . buPROPion (WELLBUTRIN SR) 150 MG 12 hr tablet Take 1 tablet (150 mg total) by mouth 2 (two) times daily. 180 tablet 0  . Fluticasone-Salmeterol (ADVAIR DISKUS) 250-50 MCG/DOSE AEPB Inhale 1 puff into the lungs 2 (two) times daily. 60 each 11  . furosemide (LASIX) 40 MG tablet Take 80 mg by mouth daily.    Marland Kitchen gabapentin (NEURONTIN) 300 MG capsule Take 1 capsule (300 mg total) by mouth 3 (three) times daily. (Patient taking differently: Take 300-600 mg by mouth at bedtime. Take 1-2 caps depending on pain) 270 capsule 3  . glucose blood (ACCU-CHEK AVIVA PLUS) test strip Use 2 to 3 times daily to check blood sugar. diag code E11.51. Non insulin dependent 200 each 3  . Lancets (ACCU-CHEK SOFT TOUCH) lancets Use as instructed 100 each 11  . levofloxacin (LEVAQUIN) 500 MG tablet Take 1 tablet on November 7, then Nov 9th then Nov 11. 3 tablet 0  . lisinopril (PRINIVIL,ZESTRIL) 5 MG tablet Take 1 tablet (5 mg total) by mouth at bedtime. 90 tablet 3  . metFORMIN (GLUCOPHAGE) 500 MG tablet Take 1 tablet (500 mg total) by mouth 2 (two) times daily with a meal. 180 tablet 3  .  metolazone (ZAROXOLYN) 2.5 MG tablet TAKE 1 TABLET BY MOUTH AS NEEDED(FOR 5 LBS WEIGHT GAIN) 90 tablet 3  . OXYGEN Inhale 4.5 L into the lungs continuous.     . pantoprazole (PROTONIX) 40 MG tablet Take 1 tablet (40 mg total) by mouth daily. 90 tablet 3  . potassium chloride SA (K-DUR,KLOR-CON) 20 MEQ tablet Take 1 tablet (20 mEq total) by mouth daily. 90 tablet 3  . predniSONE (DELTASONE) 20 MG tablet Take 2 tablets (40 mg total) by mouth daily with breakfast. Until Nov 7 4 tablet 0  . rosuvastatin (CRESTOR) 20 MG tablet Take 1 tablet (20 mg total) by mouth daily. 90 tablet 3  . tiotropium (SPIRIVA HANDIHALER) 18 MCG inhalation capsule Place 1 capsule (18 mcg total) into inhaler and inhale daily. 90 capsule 3  . vitamin B-12 (CYANOCOBALAMIN) 1000 MCG tablet Take 1 tablet (1,000 mcg total) by mouth daily. 90 tablet 3  . Vitamin D, Ergocalciferol, (DRISDOL) 50000 units CAPS capsule Take 1 capsule (50,000 Units total) by mouth every 7 (seven) days. 8 capsule 0   No current facility-administered medications for this visit.     Functional Status:  In your present state of health, do you have any difficulty performing the following activities: 02/19/2016 02/05/2016  Hearing? N N  Vision? Y N  Difficulty concentrating or making decisions? N N  Walking or climbing stairs? Y Y  Dressing or bathing?  N N  Doing errands, shopping? [name]  Preparing Food and eating ? N -  Using the Toilet? N -  In the past six months, have you accidently leaked urine? Y -  Do you have problems with loss of bowel control? N -  Managing your Medications? N -  Managing your Finances? N -  Housekeeping or managing your Housekeeping? Y -  Some recent data might be hidden    Fall/Depression Screening: PHQ 2/9 Scores 02/19/2016 11/20/2015 09/10/2015 07/07/2015 05/27/2015 05/04/2015 04/16/2015  PHQ - 2 Score 0 0 0 0 _0 PHQ- 9 Score - - - - - - -   [facility] CM Care Plan Problem One   Flowsheet Row Most Recent Value  Care Plan  Problem One  Patient had a recent hospitalization for COPD exacerbation  Role Documenting the Problem One  Care Management [place] for Problem One  Active  [facility] Long Term Goal (31-90 days)  Patient will have no hospitalization in the next 31 days  [facility] Long Term Goal Start Date  02/19/16  [facility] Long Term Goal Met Date  03/22/16  Interventions for Problem One Long Term Goal  telephone assessment to assess patient's progress towards her case management goals.  Patient reports havingno acute care admissions since last hospitalization  [facility] CM Short Term Goal #1 (0-30 days)  In the next 14 days, patient will meet with [place] for COPD education  [facility] CM Short Term Goal #1 Start Date  02/19/16  [facility] CM Short Term Goal #1 Met Date  03/03/16  Interventions for Short Term Goal #1  Educational needs assess during the intial home viist.     [facility] CM Care Plan Problem Two   Flowsheet Row Most Recent Value  Care Plan Problem Two  Patient has anxiety  Role Documenting the Problem Two  Care Management Coordinator  [facility] CM Short Term Goal #1 (0-30 days)  in the next 14 days, patient will make an appointment with her primary caregiver to ddiscuss her anxiety issues,   [facility] CM Short Term Goal #1 Start Date  03/03/16  [facility] CM Short Term Goal #1 Met Date   03/22/16     Assessment:  Patient has met her case management goals, states she cannot think of any others at this time. Patient reports improved endurance, compliance with her medications and medical appointments. Patient states she understands how to contact [facility] if further case management issues arise, states she has [facility]'s contact information.  Plan:  Discharge from caseload as all her case management goals have been met.

## 2016-03-25 IMAGING — CR DG CHEST 2V
2 series · 2 of 2 positions shown · non-contrast
Comparison: July 01, 2013 and May 13, 2013

CLINICAL DATA: Shortness of Breath

EXAM:
CHEST  2 VIEW

[w chest pa]
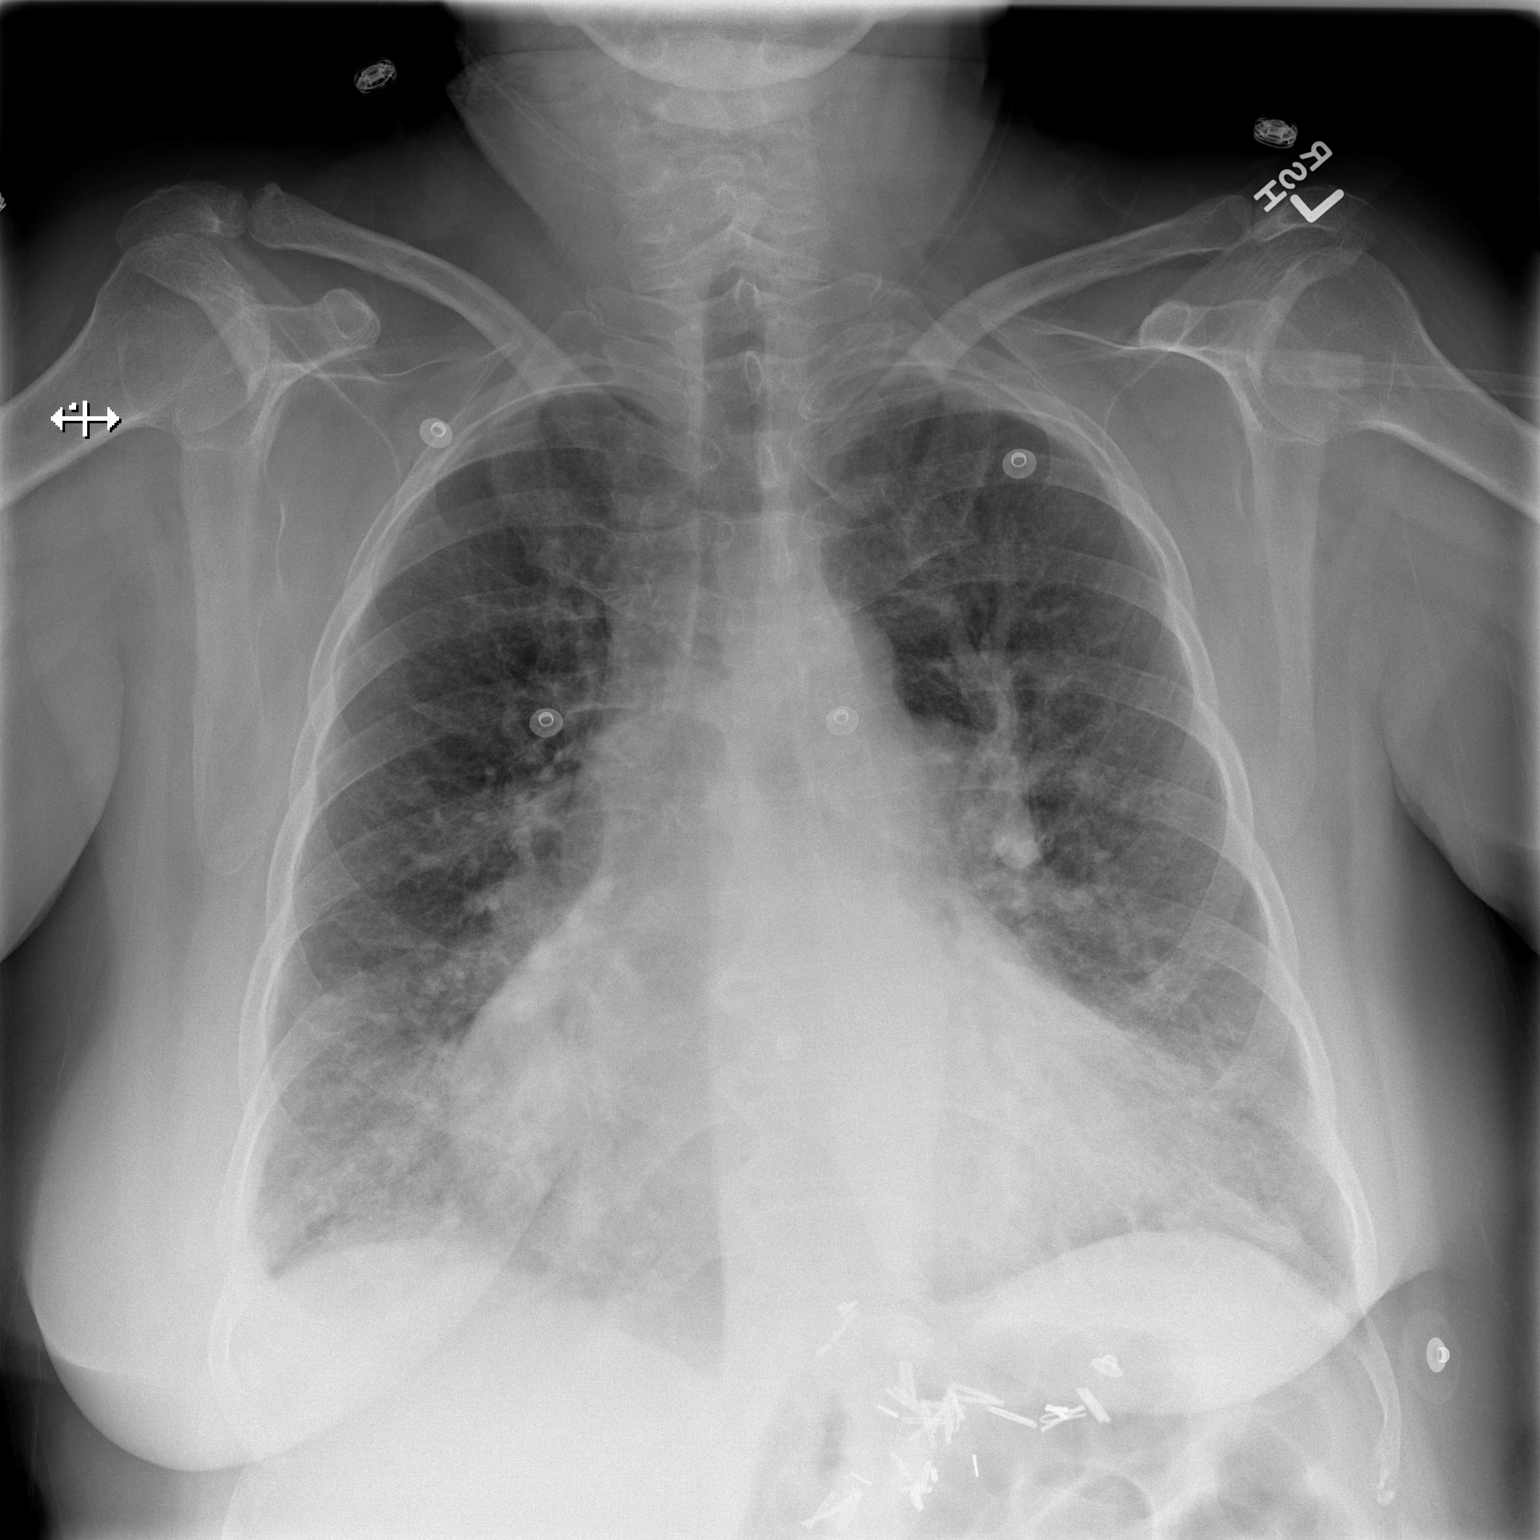

[w chest lat]
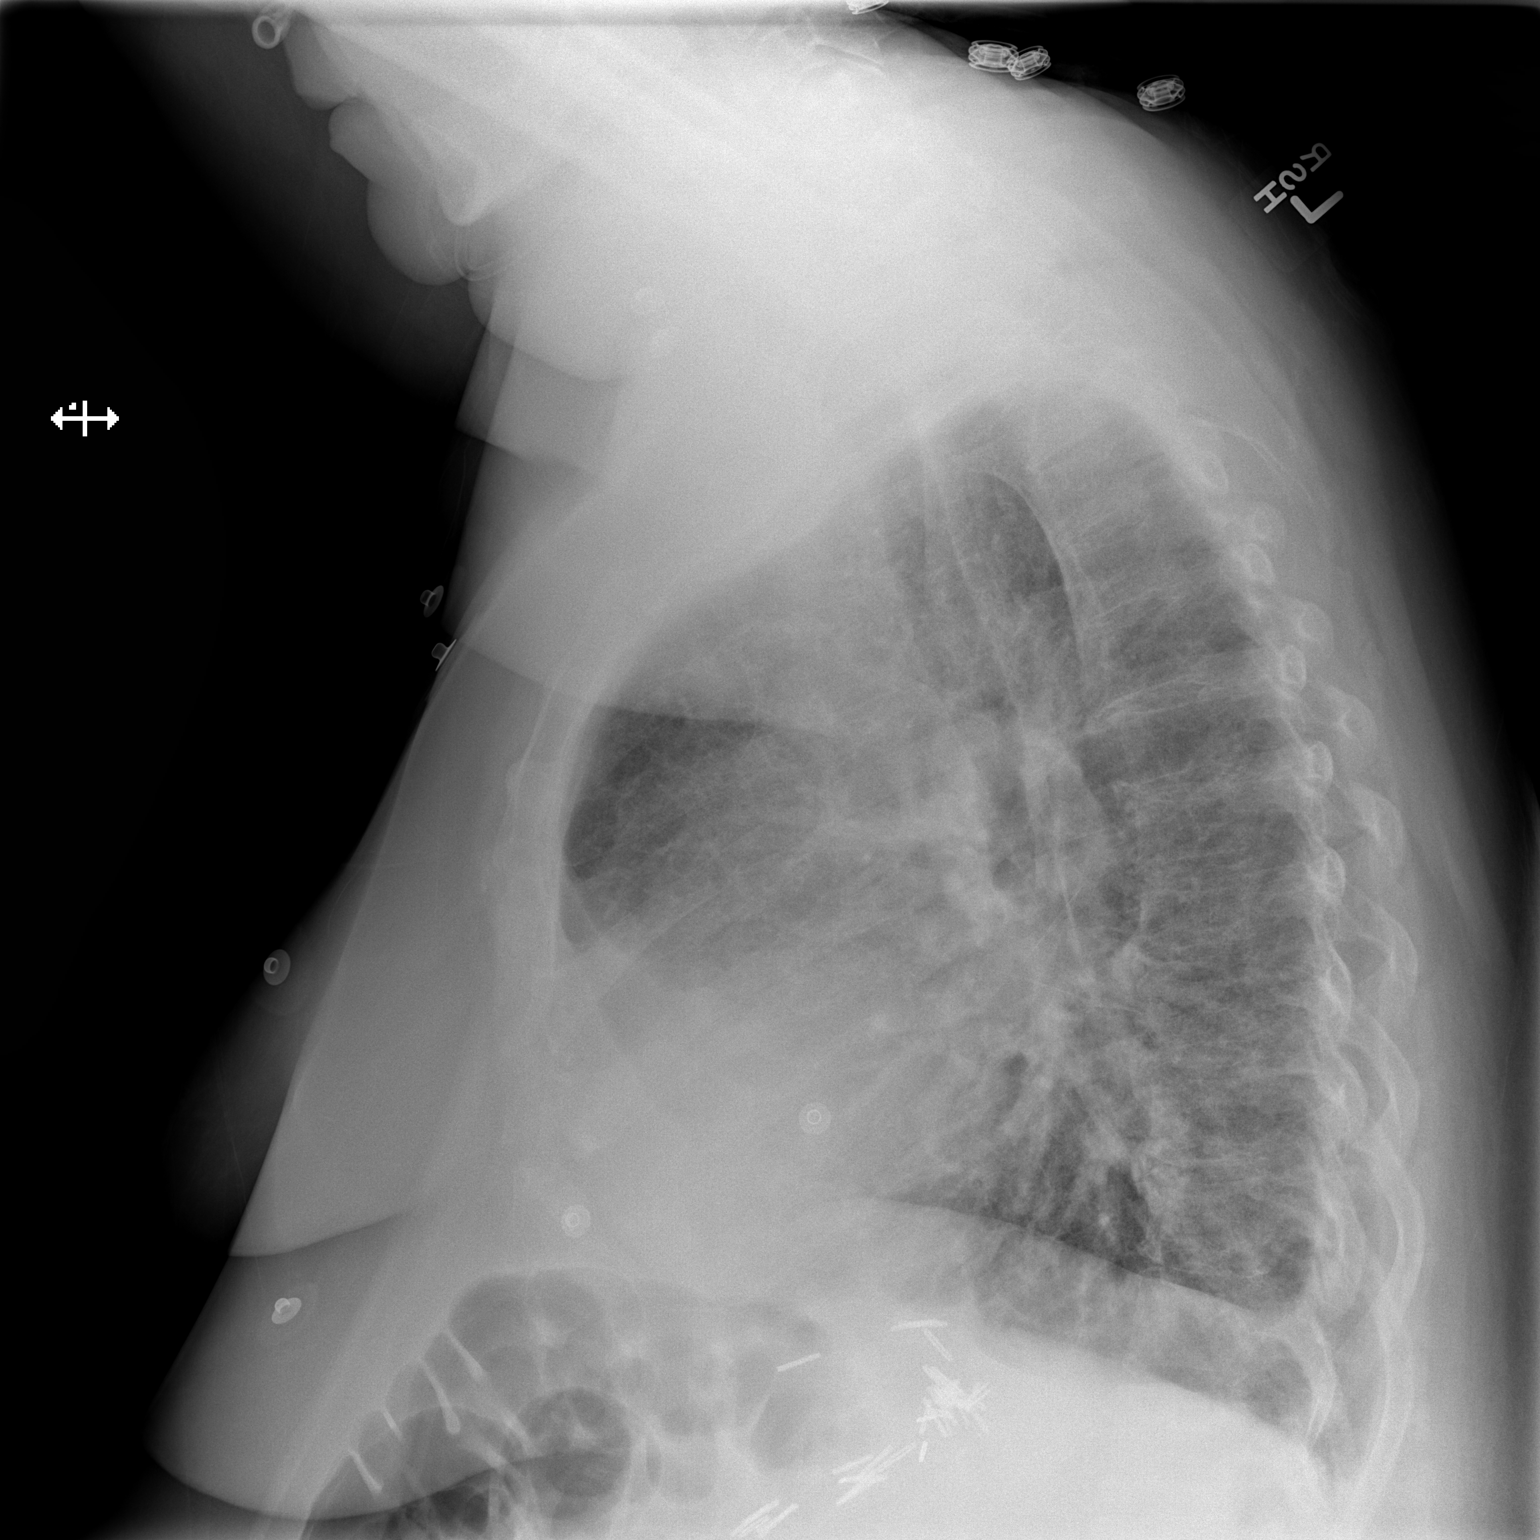

[2 of 2 positions shown; findings below may reference images not displayed]

FINDINGS: Interstitium in the mid and lower lung zones is thickened, a chronic
finding. There is a minimal right effusion. There is cardiomegaly
with pulmonary venous hypertension. There is no airspace
consolidation. There are multiple surgical clips in the left upper
abdomen. There is no appreciable adenopathy. There is degenerative
change in the thoracic spine.
IMPRESSION: Findings indicative a degree of chronic congestive heart failure. No
appreciable airspace consolidation.

## 2016-04-06 DIAGNOSIS — J9600 Acute respiratory failure, unspecified whether with hypoxia or hypercapnia: Secondary | ICD-10-CM | POA: Diagnosis not present

## 2016-04-06 DIAGNOSIS — R0902 Hypoxemia: Secondary | ICD-10-CM | POA: Diagnosis not present

## 2016-04-06 DIAGNOSIS — J449 Chronic obstructive pulmonary disease, unspecified: Secondary | ICD-10-CM | POA: Diagnosis not present

## 2016-04-08 ENCOUNTER — Other Ambulatory Visit: Payer: Self-pay | Admitting: *Deleted

## 2016-04-08 DIAGNOSIS — E1151 Type 2 diabetes mellitus with diabetic peripheral angiopathy without gangrene: Secondary | ICD-10-CM

## 2016-04-08 DIAGNOSIS — I700 Atherosclerosis of aorta: Principal | ICD-10-CM

## 2016-04-08 MED ORDER — ACCU-CHEK SOFT TOUCH LANCETS MISC: 11 refills | Status: DC

## 2016-04-08 MED ORDER — GLUCOSE BLOOD VI STRP: ORAL_STRIP | 3 refills | Status: DC

## 2016-04-11 ENCOUNTER — Other Ambulatory Visit: Payer: Self-pay | Admitting: *Deleted

## 2016-04-12 ENCOUNTER — Ambulatory Visit: Payer: Self-pay

## 2016-04-12 ENCOUNTER — Other Ambulatory Visit: Payer: Self-pay | Admitting: *Deleted

## 2016-04-12 DIAGNOSIS — E1151 Type 2 diabetes mellitus with diabetic peripheral angiopathy without gangrene: Secondary | ICD-10-CM

## 2016-04-12 DIAGNOSIS — I700 Atherosclerosis of aorta: Principal | ICD-10-CM

## 2016-04-13 MED ORDER — ACCU-CHEK SOFT TOUCH LANCETS MISC: 11 refills | Status: DC

## 2016-04-15 ENCOUNTER — Inpatient Hospital Stay (HOSPITAL_COMMUNITY)
Admission: EM | Admit: 2016-04-15 | Discharge: 2016-04-22 | DRG: 190 | Disposition: A | Discharge status: Skilled Nursing Facility | Payer: Medicare HMO | Attending: Internal Medicine | Admitting: Internal Medicine

## 2016-04-15 ENCOUNTER — Encounter (HOSPITAL_COMMUNITY): Payer: Self-pay | Admitting: Emergency Medicine

## 2016-04-15 ENCOUNTER — Inpatient Hospital Stay (HOSPITAL_COMMUNITY): Payer: Medicare HMO

## 2016-04-15 ENCOUNTER — Emergency Department (HOSPITAL_COMMUNITY): Payer: Medicare HMO

## 2016-04-15 DIAGNOSIS — R0602 Shortness of breath: Secondary | ICD-10-CM

## 2016-04-15 DIAGNOSIS — D649 Anemia, unspecified: Secondary | ICD-10-CM | POA: Insufficient documentation

## 2016-04-15 DIAGNOSIS — I270 Primary pulmonary hypertension: Secondary | ICD-10-CM | POA: Diagnosis not present

## 2016-04-15 DIAGNOSIS — Z7982 Long term (current) use of aspirin: Secondary | ICD-10-CM

## 2016-04-15 DIAGNOSIS — Z8614 Personal history of Methicillin resistant Staphylococcus aureus infection: Secondary | ICD-10-CM

## 2016-04-15 DIAGNOSIS — J9622 Acute and chronic respiratory failure with hypercapnia: Secondary | ICD-10-CM | POA: Diagnosis present

## 2016-04-15 DIAGNOSIS — I130 Hypertensive heart and chronic kidney disease with heart failure and stage 1 through stage 4 chronic kidney disease, or unspecified chronic kidney disease: Secondary | ICD-10-CM | POA: Diagnosis not present

## 2016-04-15 DIAGNOSIS — Z87891 Personal history of nicotine dependence: Secondary | ICD-10-CM | POA: Diagnosis not present

## 2016-04-15 DIAGNOSIS — K5901 Slow transit constipation: Secondary | ICD-10-CM | POA: Insufficient documentation

## 2016-04-15 DIAGNOSIS — J9601 Acute respiratory failure with hypoxia: Secondary | ICD-10-CM | POA: Diagnosis not present

## 2016-04-15 DIAGNOSIS — E1151 Type 2 diabetes mellitus with diabetic peripheral angiopathy without gangrene: Secondary | ICD-10-CM | POA: Diagnosis not present

## 2016-04-15 DIAGNOSIS — E119 Type 2 diabetes mellitus without complications: Secondary | ICD-10-CM | POA: Diagnosis present

## 2016-04-15 DIAGNOSIS — I517 Cardiomegaly: Secondary | ICD-10-CM

## 2016-04-15 DIAGNOSIS — Z7984 Long term (current) use of oral hypoglycemic drugs: Secondary | ICD-10-CM

## 2016-04-15 DIAGNOSIS — E875 Hyperkalemia: Secondary | ICD-10-CM | POA: Diagnosis not present

## 2016-04-15 DIAGNOSIS — Z6841 Body Mass Index (BMI) 40.0 and over, adult: Secondary | ICD-10-CM

## 2016-04-15 DIAGNOSIS — R2689 Other abnormalities of gait and mobility: Secondary | ICD-10-CM | POA: Diagnosis not present

## 2016-04-15 DIAGNOSIS — R0600 Dyspnea, unspecified: Secondary | ICD-10-CM

## 2016-04-15 DIAGNOSIS — R0609 Other forms of dyspnea: Secondary | ICD-10-CM

## 2016-04-15 DIAGNOSIS — J441 Chronic obstructive pulmonary disease with (acute) exacerbation: Secondary | ICD-10-CM | POA: Diagnosis not present

## 2016-04-15 DIAGNOSIS — J432 Centrilobular emphysema: Secondary | ICD-10-CM | POA: Diagnosis not present

## 2016-04-15 DIAGNOSIS — I872 Venous insufficiency (chronic) (peripheral): Secondary | ICD-10-CM | POA: Diagnosis not present

## 2016-04-15 DIAGNOSIS — I2723 Pulmonary hypertension due to lung diseases and hypoxia: Secondary | ICD-10-CM | POA: Diagnosis not present

## 2016-04-15 DIAGNOSIS — Z6835 Body mass index (BMI) 35.0-35.9, adult: Secondary | ICD-10-CM | POA: Diagnosis not present

## 2016-04-15 DIAGNOSIS — I129 Hypertensive chronic kidney disease with stage 1 through stage 4 chronic kidney disease, or unspecified chronic kidney disease: Secondary | ICD-10-CM | POA: Diagnosis not present

## 2016-04-15 DIAGNOSIS — E1122 Type 2 diabetes mellitus with diabetic chronic kidney disease: Secondary | ICD-10-CM | POA: Diagnosis not present

## 2016-04-15 DIAGNOSIS — Z794 Long term (current) use of insulin: Secondary | ICD-10-CM

## 2016-04-15 DIAGNOSIS — I5033 Acute on chronic diastolic (congestive) heart failure: Secondary | ICD-10-CM | POA: Diagnosis present

## 2016-04-15 DIAGNOSIS — I2510 Atherosclerotic heart disease of native coronary artery without angina pectoris: Secondary | ICD-10-CM | POA: Diagnosis present

## 2016-04-15 DIAGNOSIS — Z8249 Family history of ischemic heart disease and other diseases of the circulatory system: Secondary | ICD-10-CM

## 2016-04-15 DIAGNOSIS — J9600 Acute respiratory failure, unspecified whether with hypoxia or hypercapnia: Secondary | ICD-10-CM | POA: Diagnosis present

## 2016-04-15 DIAGNOSIS — Z79899 Other long term (current) drug therapy: Secondary | ICD-10-CM

## 2016-04-15 DIAGNOSIS — R0902 Hypoxemia: Secondary | ICD-10-CM | POA: Diagnosis present

## 2016-04-15 DIAGNOSIS — D631 Anemia in chronic kidney disease: Secondary | ICD-10-CM | POA: Diagnosis not present

## 2016-04-15 DIAGNOSIS — J189 Pneumonia, unspecified organism: Secondary | ICD-10-CM | POA: Diagnosis not present

## 2016-04-15 DIAGNOSIS — Z85118 Personal history of other malignant neoplasm of bronchus and lung: Secondary | ICD-10-CM | POA: Insufficient documentation

## 2016-04-15 DIAGNOSIS — I501 Left ventricular failure: Secondary | ICD-10-CM | POA: Diagnosis not present

## 2016-04-15 DIAGNOSIS — K5900 Constipation, unspecified: Secondary | ICD-10-CM

## 2016-04-15 DIAGNOSIS — E785 Hyperlipidemia, unspecified: Secondary | ICD-10-CM | POA: Diagnosis present

## 2016-04-15 DIAGNOSIS — J9621 Acute and chronic respiratory failure with hypoxia: Secondary | ICD-10-CM | POA: Diagnosis not present

## 2016-04-15 DIAGNOSIS — N179 Acute kidney failure, unspecified: Secondary | ICD-10-CM | POA: Diagnosis present

## 2016-04-15 DIAGNOSIS — D72828 Other elevated white blood cell count: Secondary | ICD-10-CM | POA: Diagnosis not present

## 2016-04-15 DIAGNOSIS — N189 Chronic kidney disease, unspecified: Secondary | ICD-10-CM

## 2016-04-15 DIAGNOSIS — I959 Hypotension, unspecified: Secondary | ICD-10-CM | POA: Diagnosis present

## 2016-04-15 DIAGNOSIS — Z90710 Acquired absence of both cervix and uterus: Secondary | ICD-10-CM

## 2016-04-15 DIAGNOSIS — N183 Chronic kidney disease, stage 3 (moderate): Secondary | ICD-10-CM

## 2016-04-15 DIAGNOSIS — K219 Gastro-esophageal reflux disease without esophagitis: Secondary | ICD-10-CM | POA: Diagnosis present

## 2016-04-15 DIAGNOSIS — R278 Other lack of coordination: Secondary | ICD-10-CM | POA: Diagnosis not present

## 2016-04-15 DIAGNOSIS — I5032 Chronic diastolic (congestive) heart failure: Secondary | ICD-10-CM | POA: Diagnosis not present

## 2016-04-15 DIAGNOSIS — J9620 Acute and chronic respiratory failure, unspecified whether with hypoxia or hypercapnia: Secondary | ICD-10-CM | POA: Diagnosis not present

## 2016-04-15 DIAGNOSIS — R079 Chest pain, unspecified: Secondary | ICD-10-CM | POA: Diagnosis not present

## 2016-04-15 DIAGNOSIS — Z95828 Presence of other vascular implants and grafts: Secondary | ICD-10-CM

## 2016-04-15 DIAGNOSIS — Z955 Presence of coronary angioplasty implant and graft: Secondary | ICD-10-CM

## 2016-04-15 DIAGNOSIS — I700 Atherosclerosis of aorta: Secondary | ICD-10-CM | POA: Diagnosis present

## 2016-04-15 DIAGNOSIS — E662 Morbid (severe) obesity with alveolar hypoventilation: Secondary | ICD-10-CM | POA: Diagnosis present

## 2016-04-15 DIAGNOSIS — J449 Chronic obstructive pulmonary disease, unspecified: Secondary | ICD-10-CM | POA: Diagnosis not present

## 2016-04-15 DIAGNOSIS — R846 Abnormal cytological findings in specimens from respiratory organs and thorax: Secondary | ICD-10-CM | POA: Diagnosis not present

## 2016-04-15 DIAGNOSIS — Z9981 Dependence on supplemental oxygen: Secondary | ICD-10-CM | POA: Diagnosis not present

## 2016-04-15 DIAGNOSIS — J90 Pleural effusion, not elsewhere classified: Secondary | ICD-10-CM | POA: Diagnosis not present

## 2016-04-15 DIAGNOSIS — Z923 Personal history of irradiation: Secondary | ICD-10-CM

## 2016-04-15 DIAGNOSIS — Z9889 Other specified postprocedural states: Secondary | ICD-10-CM

## 2016-04-15 DIAGNOSIS — F17201 Nicotine dependence, unspecified, in remission: Secondary | ICD-10-CM

## 2016-04-15 DIAGNOSIS — R531 Weakness: Secondary | ICD-10-CM | POA: Diagnosis not present

## 2016-04-15 DIAGNOSIS — N289 Disorder of kidney and ureter, unspecified: Secondary | ICD-10-CM | POA: Diagnosis not present

## 2016-04-15 LAB — RESPIRATORY PANEL BY PCR
Adenovirus: NOT DETECTED
BORDETELLA PERTUSSIS-RVPCR: NOT DETECTED
Chlamydophila pneumoniae: NOT DETECTED
Coronavirus 229E: NOT DETECTED
Coronavirus HKU1: NOT DETECTED
Coronavirus NL63: NOT DETECTED
Coronavirus OC43: NOT DETECTED
INFLUENZA B-RVPPCR: NOT DETECTED
Influenza A: NOT DETECTED
Metapneumovirus: NOT DETECTED
Mycoplasma pneumoniae: NOT DETECTED
PARAINFLUENZA VIRUS 2-RVPPCR: NOT DETECTED
PARAINFLUENZA VIRUS 3-RVPPCR: NOT DETECTED
PARAINFLUENZA VIRUS 4-RVPPCR: NOT DETECTED
Parainfluenza Virus 1: NOT DETECTED
RESPIRATORY SYNCYTIAL VIRUS-RVPPCR: NOT DETECTED
RHINOVIRUS / ENTEROVIRUS - RVPPCR: NOT DETECTED

## 2016-04-15 LAB — CBC WITH DIFFERENTIAL/PLATELET
Basophils Absolute: 0 10*3/uL (ref 0.0–0.1)
Basophils Relative: 0 %
Eosinophils Absolute: 0.9 10*3/uL — ABNORMAL HIGH (ref 0.0–0.7)
Eosinophils Relative: 8 %
HEMATOCRIT: 23.2 % — AB (ref 36.0–46.0)
Hemoglobin: 7.2 g/dL — ABNORMAL LOW (ref 12.0–15.0)
LYMPHS PCT: 10 %
Lymphs Abs: 1 10*3/uL (ref 0.7–4.0)
MCH: 29.9 pg (ref 26.0–34.0)
MCHC: 31 g/dL (ref 30.0–36.0)
MCV: 96.3 fL (ref 78.0–100.0)
MONO ABS: 1.8 10*3/uL — AB (ref 0.1–1.0)
MONOS PCT: 17 %
NEUTROS ABS: 7 10*3/uL (ref 1.7–7.7)
Neutrophils Relative %: 65 %
Platelets: 255 10*3/uL (ref 150–400)
RBC: 2.41 MIL/uL — ABNORMAL LOW (ref 3.87–5.11)
RDW: 14.6 % (ref 11.5–15.5)
WBC: 10.7 10*3/uL — ABNORMAL HIGH (ref 4.0–10.5)

## 2016-04-15 LAB — VITAMIN B12: VITAMIN B 12: 1155 pg/mL — AB (ref 180–914)

## 2016-04-15 LAB — INFLUENZA PANEL BY PCR (TYPE A & B)
Influenza A By PCR: NEGATIVE
Influenza B By PCR: NEGATIVE

## 2016-04-15 LAB — I-STAT CG4 LACTIC ACID, ED
Lactic Acid, Venous: 1.14 mmol/L (ref 0.5–1.9)
Lactic Acid, Venous: 1.93 mmol/L (ref 0.5–1.9)

## 2016-04-15 LAB — IRON AND TIBC
IRON: 25 ug/dL — AB (ref 28–170)
Saturation Ratios: 11 % (ref 10.4–31.8)
TIBC: 238 ug/dL — AB (ref 250–450)
UIBC: 213 ug/dL

## 2016-04-15 LAB — I-STAT ARTERIAL BLOOD GAS, ED
Acid-Base Excess: 5 mmol/L — ABNORMAL HIGH (ref 0.0–2.0)
Bicarbonate: 32.4 mmol/L — ABNORMAL HIGH (ref 20.0–28.0)
O2 Saturation: 86 %
PCO2 ART: 65.6 mmHg — AB (ref 32.0–48.0)
PH ART: 7.305 — AB (ref 7.350–7.450)
TCO2: 34 mmol/L (ref 0–100)
pO2, Arterial: 61 mmHg — ABNORMAL LOW (ref 83.0–108.0)

## 2016-04-15 LAB — COMPREHENSIVE METABOLIC PANEL
ALK PHOS: 113 U/L (ref 38–126)
ALT: 11 U/L — ABNORMAL LOW (ref 14–54)
ANION GAP: 12 (ref 5–15)
AST: 10 U/L — AB (ref 15–41)
Albumin: 3.1 g/dL — ABNORMAL LOW (ref 3.5–5.0)
BILIRUBIN TOTAL: 0.9 mg/dL (ref 0.3–1.2)
BUN: 34 mg/dL — AB (ref 6–20)
CALCIUM: 8.9 mg/dL (ref 8.9–10.3)
CO2: 28 mmol/L (ref 22–32)
Chloride: 95 mmol/L — ABNORMAL LOW (ref 101–111)
Creatinine, Ser: 2.68 mg/dL — ABNORMAL HIGH (ref 0.44–1.00)
GFR calc Af Amer: 21 mL/min — ABNORMAL LOW (ref >60)
GFR, EST NON AFRICAN AMERICAN: 18 mL/min — AB (ref >60)
Glucose, Bld: 166 mg/dL — ABNORMAL HIGH (ref 65–99)
POTASSIUM: 4.6 mmol/L (ref 3.5–5.1)
Sodium: 135 mmol/L (ref 135–145)
Total Protein: 6.7 g/dL (ref 6.5–8.1)

## 2016-04-15 LAB — FOLATE: FOLATE: 15.4 ng/mL (ref >5.9)

## 2016-04-15 LAB — URINALYSIS, ROUTINE W REFLEX MICROSCOPIC
Bilirubin Urine: NEGATIVE
GLUCOSE, UA: NEGATIVE mg/dL
Ketones, ur: NEGATIVE mg/dL
Nitrite: NEGATIVE
PROTEIN: 100 mg/dL — AB
Specific Gravity, Urine: 1.014 (ref 1.005–1.030)
pH: 5 (ref 5.0–8.0)

## 2016-04-15 LAB — FERRITIN: FERRITIN: 91 ng/mL (ref 11–307)

## 2016-04-15 LAB — TROPONIN I

## 2016-04-15 LAB — SAVE SMEAR

## 2016-04-15 LAB — PREPARE RBC (CROSSMATCH)

## 2016-04-15 LAB — ABO/RH: ABO/RH(D): A POS

## 2016-04-15 LAB — RETICULOCYTES
RBC.: 2.46 MIL/uL — ABNORMAL LOW (ref 3.87–5.11)
RETIC COUNT ABSOLUTE: 81.2 10*3/uL (ref 19.0–186.0)
Retic Ct Pct: 3.3 % — ABNORMAL HIGH (ref 0.4–3.1)

## 2016-04-15 LAB — PROCALCITONIN: Procalcitonin: 0.1 ng/mL

## 2016-04-15 LAB — BRAIN NATRIURETIC PEPTIDE: B Natriuretic Peptide: 50.7 pg/mL (ref 0.0–100.0)

## 2016-04-15 MED ORDER — BUPROPION HCL ER (SR) 150 MG PO TB12
150.0000 mg | ORAL_TABLET | Freq: Two times a day (BID) | ORAL | Status: DC
  Administered 2016-04-15 – 2016-04-22 (×15): 150 mg via ORAL
  Filled 2016-04-15 (×15): qty 1

## 2016-04-15 MED ORDER — GABAPENTIN 300 MG PO CAPS
300.0000 mg | ORAL_CAPSULE | Freq: Three times a day (TID) | ORAL | Status: DC
  Administered 2016-04-15 – 2016-04-18 (×10): 300 mg via ORAL
  Filled 2016-04-15 (×11): qty 1

## 2016-04-15 MED ORDER — SODIUM CHLORIDE 0.9 % IV BOLUS (SEPSIS)
1000.0000 mL | Freq: Once | INTRAVENOUS | Status: AC
  Administered 2016-04-15: 1000 mL via INTRAVENOUS

## 2016-04-15 MED ORDER — ACETAMINOPHEN 650 MG RE SUPP: 650.0000 mg | Freq: Four times a day (QID) | RECTAL | Status: DC | PRN

## 2016-04-15 MED ORDER — SODIUM CHLORIDE 0.9 % IV SOLN: Freq: Once | INTRAVENOUS | Status: DC

## 2016-04-15 MED ORDER — SENNOSIDES-DOCUSATE SODIUM 8.6-50 MG PO TABS: 2.0000 | ORAL_TABLET | Freq: Every evening | ORAL | Status: DC | PRN

## 2016-04-15 MED ORDER — ROSUVASTATIN CALCIUM 10 MG PO TABS
20.0000 mg | ORAL_TABLET | Freq: Every day | ORAL | Status: DC
  Administered 2016-04-15 – 2016-04-22 (×8): 20 mg via ORAL
  Filled 2016-04-15 (×4): qty 2
  Filled 2016-04-15: qty 1
  Filled 2016-04-15 (×3): qty 2

## 2016-04-15 MED ORDER — VITAMIN B-12 1000 MCG PO TABS
1000.0000 ug | ORAL_TABLET | Freq: Every day | ORAL | Status: DC
  Administered 2016-04-15 – 2016-04-22 (×8): 1000 ug via ORAL
  Filled 2016-04-15 (×8): qty 1

## 2016-04-15 MED ORDER — DEXTROSE 5 % IV SOLN
250.0000 mg | INTRAVENOUS | Status: DC
  Administered 2016-04-15 – 2016-04-16 (×2): 250 mg via INTRAVENOUS
  Filled 2016-04-15 (×2): qty 250

## 2016-04-15 MED ORDER — IPRATROPIUM-ALBUTEROL 0.5-2.5 (3) MG/3ML IN SOLN
3.0000 mL | Freq: Four times a day (QID) | RESPIRATORY_TRACT | Status: DC
  Administered 2016-04-15 – 2016-04-17 (×8): 3 mL via RESPIRATORY_TRACT
  Filled 2016-04-15 (×9): qty 3

## 2016-04-15 MED ORDER — PANTOPRAZOLE SODIUM 40 MG PO TBEC
40.0000 mg | DELAYED_RELEASE_TABLET | Freq: Every day | ORAL | Status: DC
  Administered 2016-04-15 – 2016-04-22 (×8): 40 mg via ORAL
  Filled 2016-04-15 (×8): qty 1

## 2016-04-15 MED ORDER — VITAMIN D (ERGOCALCIFEROL) 1.25 MG (50000 UNIT) PO CAPS
50000.0000 [IU] | ORAL_CAPSULE | ORAL | Status: DC
  Administered 2016-04-18: 50000 [IU] via ORAL
  Filled 2016-04-15: qty 1

## 2016-04-15 MED ORDER — SODIUM CHLORIDE 0.9 % IV SOLN
Freq: Once | INTRAVENOUS | Status: AC
  Administered 2016-04-15: 10 mL/h via INTRAVENOUS

## 2016-04-15 MED ORDER — SODIUM CHLORIDE 0.9 % IV SOLN
1250.0000 mg | Freq: Once | INTRAVENOUS | Status: AC
  Administered 2016-04-15: 1250 mg via INTRAVENOUS
  Filled 2016-04-15: qty 1250

## 2016-04-15 MED ORDER — CEFEPIME HCL 2 G IJ SOLR
2.0000 g | Freq: Once | INTRAMUSCULAR | Status: AC
  Administered 2016-04-15: 2 g via INTRAVENOUS
  Filled 2016-04-15: qty 2

## 2016-04-15 MED ORDER — SODIUM CHLORIDE 0.9 % IV SOLN
INTRAVENOUS | Status: DC
  Administered 2016-04-15: 75 mL/h via INTRAVENOUS

## 2016-04-15 MED ORDER — ALBUTEROL SULFATE (2.5 MG/3ML) 0.083% IN NEBU
5.0000 mg | INHALATION_SOLUTION | Freq: Once | RESPIRATORY_TRACT | Status: AC
  Administered 2016-04-15: 5 mg via RESPIRATORY_TRACT
  Filled 2016-04-15: qty 6

## 2016-04-15 MED ORDER — METHYLPREDNISOLONE SODIUM SUCC 125 MG IJ SOLR
125.0000 mg | Freq: Once | INTRAMUSCULAR | Status: AC
  Administered 2016-04-15: 125 mg via INTRAVENOUS
  Filled 2016-04-15: qty 2

## 2016-04-15 MED ORDER — ACETAMINOPHEN 325 MG PO TABS
650.0000 mg | ORAL_TABLET | Freq: Four times a day (QID) | ORAL | Status: DC | PRN
  Administered 2016-04-20 – 2016-04-22 (×3): 650 mg via ORAL
  Filled 2016-04-15 (×3): qty 2

## 2016-04-15 MED ORDER — POLYETHYLENE GLYCOL 3350 17 G PO PACK
17.0000 g | PACK | Freq: Every day | ORAL | Status: DC
  Administered 2016-04-15 – 2016-04-17 (×3): 17 g via ORAL
  Filled 2016-04-15 (×5): qty 1

## 2016-04-15 NOTE — ED Notes (Signed)
Dr. Beryle Beams aware that LDH  had hemoltysized and states we can wait for next lab draw to repeat.

## 2016-04-15 NOTE — ED Notes (Signed)
EDP at bedside, aware of BP dropping & O2 sats upper 80's on 6L [place].

## 2016-04-15 NOTE — ED Notes (Signed)
Pt oob to bedside commode to void.

## 2016-04-15 NOTE — ED Provider Notes (Signed)
[facility] DEPT Provider Note   CSN: [id number] Arrival date & time: 04/15/16  0211   By signing my name below, I, Delton Prairie, attest that this documentation has been prepared under the direction and in the presence of Jola Schmidt, MD  Electronically Signed: Delton Prairie, ED Scribe. 04/15/16. 2:34 AM.   History   Chief Complaint Chief Complaint  Patient presents with  . Respiratory Distress    The history is provided by the patient. No language interpreter was used.   HPI Comments:  [name] is a 61 y.o. female, with a hx of CHF, COPD and CAD, who presents to the Emergency Department complaining of persistent, worsening SOB x several days. Her SOB is worse with exertion. Pt also reports a cough and chills. She states she has been propping herself up more in bed recently. She also notes a 30 pound weight gain since 08/17. Pt states she is usually on 4L of oxygen at home but increased it to 5L yesterday. She has tried 2-3 breathing treatments with no relief. No other associated symptoms noted.   Past Medical History:  Diagnosis Date  . Arthritis    "some scattered" (03/03/2015)  . CHF (congestive heart failure) ([place])   . Chronic bronchitis ([place])    "get it most years" (03/03/2015)  . COPD (chronic obstructive pulmonary disease) (HCC)    Severe. Gold Stage IV.  PFTs (12/2008) - severe obstructive airway disease. Active tobacco use. Requires 4L O2 at home.  . Coronary artery disease    S/P PCI of LAD with DES (12/2008). Total occlusion of RCA noted at that time., medically managed. ACS ruled out 03/2009 with Lexiscan myoview . Followed by [place].  . Depression   . Diastolic dysfunction    2-D Echo (12/2008) - Normal LV Systolic funciton with EF 60-65%. Grade 1 diastolid dysfunction. No regional wall motion abnormalities. Moderate pulmonary HTN with PA peak pressure 71mHg.  . Full dentures   . GERD (gastroesophageal reflux disease)    S/P Nissen fundoplication.  .Marland Kitchen History of hiatal hernia   . Hx MRSA infection    Recurrent MRSA thigh abscesses.  . Hyperlipidemia   . Lung cancer ([place]    "left"  . Obesity   . On home oxygen therapy since 2010   4L all the time  . Pneumonia   . Prediabetes    HgbA1c 6.4 (12/2008)  . Pulmonary hypertension    2-D Echo (064/4034 - Systolic pressure was moderately increased. PA peak pressure  543mg. 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 Followed by Dr. WrJoya GaskinsLVelora Heckler . Pulmonary nodule, right    Small right middle lobe nodule. Stable as of 12/2008.  . Marland Kitchenhortness of breath dyspnea    with a lot of exertion; if fluid builds up    Patient Active Problem List   Diagnosis Date Noted  . Vitamin D deficiency 02/15/2016  . Oral candidiasis 02/15/2016  . Bronchitis, chronic obstructive w acute bronchitis ([facility]11/06/2015  . Elevated alkaline phosphatase level 02/05/2016  . Pleural effusion on left 02/05/2016  . Acute kidney injury ([facility]11/05/2015  . CKD (chronic kidney disease) stage 3, GFR 30-59 ml/min 02/04/2016  . Benign paroxysmal positional vertigo 09/10/2015  . Subjective tinnitus 09/10/2015  . Chronic venous insufficiency   . COPD with exacerbation ([facility]01/09/2015  . Vitamin B12 deficiency neuropathy ([facility]09/18/2016  . Primary adenocarcinoma of left  lung ([place]) 12/04/2014  . Healthcare maintenance 02/06/2013  . Chronic diastolic heart failure ([place]) 11/05/2012  . Hyperlipidemia   . Financial difficulties 06/15/2010  . Pulmonary hypertension 01/05/2009  . Obesity, Class II, BMI 35-39.9 12/31/2008  . Sleep apnea 12/31/2008  . Tobacco abuse 12/22/2008  . 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 Severe chronic obstructive pulmonary disease ([place]) 12/22/2008  . Gastroesophageal reflux disease 12/22/2008  . Type 2 diabetes mellitus with atherosclerosis of  aorta ([place]) 12/22/2008    Past Surgical History:  Procedure Laterality Date  . ABDOMINAL HYSTERECTOMY  1980's   "still have my cervix"  . APPENDECTOMY    . BACK SURGERY    . BILATERAL OOPHORECTOMY  1979  . BREAST LUMPECTOMY Right 1990's    (biopsy negative)  . CORONARY ANGIOPLASTY WITH STENT PLACEMENT  2010   [place]; RCA  . HERNIA REPAIR    . KNEE ARTHROSCOPY Right ~ 2000  . [place] SURGERY  2001   "herniated discs; both by [place], Dr. Lorin Mercy"  . NISSEN FUNDOPLICATION    . RIGHT HEART CATHETERIZATION N/A 11/09/2012   Procedure: RIGHT HEART CATH;  Surgeon: Larey Dresser, MD;  Location[facility] LAB;  Service: Cardiovascular;  Laterality: N/A;  . TONSILLECTOMY    . TUBAL LIGATION  1979  . VIDEO BRONCHOSCOPY WITH ENDOBRONCHIAL ULTRASOUND N/A 12/15/2014   Procedure: VIDEO BRONCHOSCOPY WITH ENDOBRONCHIAL ULTRASOUND;  Surgeon: Ivin Poot, MD;  Location: [facility] OR;  Service: Thoracic;  Laterality: N/A;    OB History    No data available       Home Medications    Prior to Admission medications   Medication Sig Start Date End Date Taking? Authorizing Provider  albuterol (PROVENTIL) (2.5 MG/3ML) 0.083% nebulizer solution Take 3 mLs (2.5 mg total) by nebulization every 6 (six) hours as needed for shortness of breath. 09/05/14   Oval Linsey, MD  albuterol (VENTOLIN HFA) 108 (90 Base) MCG/ACT inhaler Inhale 1-2 puffs into the lungs every 6 (six) hours as needed for shortness of breath. 02/03/16   Oval Linsey, MD  aspirin 81 MG chewable tablet Chew 1 tablet (81 mg total) by mouth at bedtime. 01/16/15   Oval Linsey, MD  Blood Glucose Monitoring Suppl (ACCU-CHEK AVIVA) device Use 2-3 times daily, ICD E11.51, non-insulin requiring 02/08/16 02/07/17  Oval Linsey, MD  buPROPion [facility] SR) 150 MG 12 hr tablet Take 1 tablet (150 mg total) by mouth 2 (two) times daily. 02/15/16   Oval Linsey, MD  Fluticasone-Salmeterol (ADVAIR DISKUS) 250-50 MCG/DOSE AEPB Inhale 1 puff into  the lungs 2 (two) times daily. 06/04/15   Oval Linsey, MD  furosemide (LASIX) 40 MG tablet Take 80 mg by mouth daily.    Historical Provider, MD  gabapentin (NEURONTIN) 300 MG capsule Take 1 capsule (300 mg total) by mouth 3 (three) times daily. Patient taking differently: Take 300-600 mg by mouth at bedtime. Take 1-2 caps depending on pain 02/03/16   Oval Linsey, MD  glucose blood (ACCU-CHEK AVIVA PLUS) test strip Use 2 to 3 times daily to check blood sugar. diag code E11.51. Non insulin dependent 04/08/16   Oval Linsey, MD  Lancets (ACCU-CHEK SOFT [facility]) lancets Use 2 to 3 times daily to check blood sugar. diag code E11.51. Non insulin dependent. 04/13/16   Oval Linsey, MD  levofloxacin [facility]) 500 MG tablet Take 1 tablet on November 7, then Nov 9th then Nov 11. 02/07/16   Burgess Estelle, MD  lisinopril (PRINIVIL,ZESTRIL) 5  MG tablet Take 1 tablet (5 mg total) by mouth at bedtime. 11/05/15   Amy D Ninfa Meeker, NP  metFORMIN (GLUCOPHAGE) 500 MG tablet Take 1 tablet (500 mg total) by mouth 2 (two) times daily with a meal. 09/10/15   Oval Linsey, MD  metolazone (ZAROXOLYN) 2.5 MG tablet TAKE 1 TABLET BY MOUTH AS NEEDED(FOR 5 LBS WEIGHT GAIN) 05/13/15   [name] Friar, PA-C  OXYGEN Inhale 4.5 L into the lungs continuous.       pantoprazole (PROTONIX) 40 MG tablet Take 1 tablet (40 mg total) by mouth daily. 11/05/15   Oval Linsey, MD  potassium chloride SA (K-DUR,KLOR-CON) 20 MEQ tablet Take 1 tablet (20 mEq total) by mouth daily. 11/30/15   Oval Linsey, MD  predniSONE (DELTASONE) 20 MG tablet Take 2 tablets (40 mg total) by mouth daily with breakfast. Until Nov 7 02/08/16   Burgess Estelle, MD  rosuvastatin (CRESTOR) 20 MG tablet Take 1 tablet (20 mg total) by mouth daily. 09/10/15   Oval Linsey, MD  tiotropium (SPIRIVA HANDIHALER) 18 MCG inhalation capsule Place 1 capsule (18 mcg total) into inhaler and inhale daily. 10/01/15   Oval Linsey, MD  vitamin B-12 (CYANOCOBALAMIN) 1000 MCG tablet  Take 1 tablet (1,000 mcg total) by mouth daily. 09/10/15   Oval Linsey, MD  Vitamin D, Ergocalciferol, (DRISDOL) 50000 units CAPS capsule Take 1 capsule (50,000 Units total) by mouth every 7 (seven) days. 02/15/16   Oval Linsey, MD    Family History Family History  Problem Relation Age of Onset  . Heart disease Mother 44    Deceased from MI at 62yo  . Hypertension Mother   . Heart disease Father 53    Deceased of MI age 63yo  . Hypertension Father   . Hypertension Brother   . Lung cancer      Grandmother    Social History Social History  Substance Use Topics  . Smoking status: Former Smoker    Packs/day: 1.00    Years: 45.00    Types: Cigarettes    Start date: 04/04/1969    Quit date: 11/04/2015  . Smokeless tobacco: Never Used     Comment: 1 cig per day  . Alcohol use No     Allergies   Fluconazole and Atorvastatin   Review of Systems Review of Systems 10 systems reviewed and all are negative for acute change except as noted in the HPI.  Physical Exam Updated Vital Signs BP (!) 96/50 (BP Location: Right Arm)   Pulse 98   Temp 99.1 F (37.3 C) (Oral)   Resp 19   SpO2 90%   Physical Exam  Constitutional: She is oriented to person, place, and time. She appears well-developed and well-nourished. No distress.  HENT:  Head: Normocephalic and atraumatic.  Eyes: EOM are normal.  Neck: Normal range of motion.  Cardiovascular: Normal rate, regular rhythm and normal heart sounds.   Pulmonary/Chest: Effort normal. Tachypnea (mild) noted.  Decreased breath sounds in the bases  Abdominal: Soft. She exhibits no distension. There is no tenderness.  Musculoskeletal: Normal range of motion.  Neurological: She is alert and oriented to person, place, and time.  Skin: Skin is warm and dry.  Psychiatric: She has a normal mood and affect. Judgment normal.  Nursing note and vitals reviewed.    ED Treatments / Results  DIAGNOSTIC STUDIES:  Oxygen Saturation is 90% on  [place], low by my interpretation.    COORDINATION OF CARE:  2:31 AM Discussed treatment plan with pt  at bedside and pt agreed to plan.  Labs (all labs ordered are listed, but only abnormal results are displayed) Labs Reviewed  CBC WITH DIFFERENTIAL/PLATELET - Abnormal; Notable for the following:       Result Value   WBC 10.7 (*)    RBC 2.41 (*)    Hemoglobin 7.2 (*)    HCT 23.2 (*)    Monocytes Absolute 1.8 (*)    Eosinophils Absolute 0.9 (*)    All other components within normal limits  COMPREHENSIVE METABOLIC PANEL - Abnormal; Notable for the following:    Chloride 95 (*)    Glucose, Bld 166 (*)    BUN 34 (*)    Creatinine, Ser 2.68 (*)    Albumin 3.1 (*)    AST 10 (*)    ALT 11 (*)    GFR calc non Af Amer 18 (*)    GFR calc Af Amer 21 (*)    All other components within normal limits  CULTURE, BLOOD (ROUTINE X 2)  CULTURE, BLOOD (ROUTINE X 2)  URINE CULTURE  TROPONIN I  BRAIN NATRIURETIC PEPTIDE  URINALYSIS, ROUTINE W REFLEX MICROSCOPIC  VITAMIN B12  FOLATE  IRON AND TIBC  FERRITIN  RETICULOCYTES  I-STAT CG4 LACTIC ACID, ED  I-STAT ARTERIAL BLOOD GAS, ED    EKG  EKG Interpretation  Date/Time:  Friday April 15 2016 02:21:24 EST Ventricular Rate:  98 PR Interval:    QRS Duration: 98 QT Interval:  324 QTC Calculation: 414 R Axis:   86 Text Interpretation:  Sinus rhythm Low voltage, precordial leads Consider right ventricular hypertrophy Borderline T abnormalities, anterior leads No significant change was found Confirmed by Corinthia Helmers  MD, Lennette Bihari ([id number]) on 04/15/2016 5:54:14 AM       Radiology Dg Chest 2 View  Result Date: 04/15/2016 CLINICAL DATA:  Shortness of breath with exertion. Chest pain. Increasing home oxygen over the past few days. Wheezing. EXAM: CHEST  2 VIEW COMPARISON:  02/06/2016 FINDINGS: Cardiac enlargement with pulmonary vascular congestion. Increasing perihilar infiltrates since prior study suggest edema. Left pleural effusion with basilar  atelectasis. No pneumothorax. Degenerative changes in the spine. Surgical clips in the left upper quadrant. IMPRESSION: Cardiac enlargement with pulmonary vascular congestion and increasing perihilar infiltrates, likely edema. Left pleural effusion is increasing. Atelectasis in the left lung base. Electronically Signed   By: Lucienne Capers M.D.   On: 04/15/2016 03:00    Procedures Procedures (including critical care time)  Medications Ordered in ED Medications  vancomycin (VANCOCIN) 1,250 mg in sodium chloride 0.9 % 250 mL IVPB (1,250 mg Intravenous New Bag/Given 04/15/16 0445)  albuterol (PROVENTIL) (2.5 MG/3ML) 0.083% nebulizer solution 5 mg (not administered)  methylPREDNISolone sodium succinate (SOLU-MEDROL) 125 mg/2 mL injection 125 mg (not administered)  ceFEPIme (MAXIPIME) 2 g in dextrose 5 % 50 mL IVPB (2 g Intravenous New Bag/Given 04/15/16 0445)  sodium chloride 0.9 % bolus 1,000 mL (1,000 mLs Intravenous New Bag/Given 04/15/16 0445)     Initial Impression / Assessment and Plan / ED Course  I have reviewed the triage vital signs and the nursing notes.  Pertinent labs & imaging results that were available during my care of the patient were reviewed by me and considered in my medical decision making (see chart for details).  Clinical Course   Patient with chills and worsening shortness of breath at home.  She does have a history of COPD.  I worry that she has infiltrated in her x-ray and this is likely developing pneumonia with  new hypoxia.  Patient is requiring 6 L nasal cannula.  Her baseline is 4 L nasal cannula.  She does have diastolic heart failure as well.  Decreased breath sounds in the bases.  Additional albuterol now.  Solu-Medrol now for pneumonia/COPD exacerbation.  Final Clinical Impressions(s) / ED Diagnoses   Final diagnoses:  Hypoxia  Acute on chronic respiratory failure, unspecified whether with hypoxia or hypercapnia (HCC)    New Prescriptions New  Prescriptions   No medications on file   I personally performed the services described in this documentation, which was scribed in my presence. The recorded information has been reviewed and is accurate.        Jola Schmidt, MD 04/15/16 [phone]

## 2016-04-15 NOTE — Consult Note (Signed)
PULMONARY / CRITICAL CARE MEDICINE   Name: [name] MRN: [id number] DOB: 09-12-1955    ADMISSION DATE:  04/15/2016 CONSULTATION DATE:  1/12  REFERRING MD: IMTS  CHIEF COMPLAINT: SOB  HISTORY OF PRESENT ILLNESS:    MO former smoker, O2 dependent, limited in ADL's due to SOB who has an extensive PMH that is well documented and presents to [facility] ED with 2 days of increasing SOB, chest burning and NP cough. Whiled in ED she was SOB but responded to fluids and was making urine and able to use BSC with out problem. Due to her extensive pmh, concern for pan in a immunocompromised patient she will be admitted to SDU and PCCM will monitor.   PAST MEDICAL HISTORY :  She  has a past medical history of Arthritis; CHF (congestive heart failure) ([place]); Chronic bronchitis ([place]); COPD (chronic obstructive pulmonary disease) ([place]); Coronary artery disease; Depression; Diastolic dysfunction; Full dentures; GERD (gastroesophageal reflux disease); History of hiatal hernia; MRSA infection; Hyperlipidemia; Lung cancer ([place]); Obesity; On home oxygen therapy (since 2010); Pneumonia; Prediabetes; Pulmonary hypertension; Pulmonary nodule, right; and Shortness of breath dyspnea.  PAST SURGICAL HISTORY: She  has a past surgical history that includes Nissen fundoplication; right heart catheterization (N/A, 11/09/2012); Appendectomy; Tonsillectomy; Back surgery; Knee arthroscopy (Right, ~ 2000); Tubal ligation (1979); Video bronchoscopy with endobronchial ultrasound (N/A, 12/15/2014); Coronary angioplasty with stent (2010); Bilateral oophorectomy (1979); Hernia repair; Breast lumpectomy (Right, 1990's); Lumbar disc surgery (2001); and Abdominal hysterectomy (1980's).  Allergies  Allergen Reactions  . Fluconazole Anaphylaxis, Itching and Swelling  . Atorvastatin Other (See Comments)    Dizziness after med started and resolved per pt after med stopped. September 08 2014    No current facility-administered medications on file  prior to encounter.    Current Outpatient Prescriptions on File Prior to Encounter  Medication Sig  . albuterol (PROVENTIL) (2.5 MG/3ML) 0.083% nebulizer solution Take 3 mLs (2.5 mg total) by nebulization every 6 (six) hours as needed for shortness of breath.  Marland Kitchen albuterol (VENTOLIN HFA) 108 (90 Base) MCG/ACT inhaler Inhale 1-2 puffs into the lungs every 6 (six) hours as needed for shortness of breath.  Marland Kitchen aspirin 81 MG chewable tablet Chew 1 tablet (81 mg total) by mouth at bedtime.  Marland Kitchen buPROPion (WELLBUTRIN SR) 150 MG 12 hr tablet Take 1 tablet (150 mg total) by mouth 2 (two) times daily.  . Fluticasone-Salmeterol (ADVAIR DISKUS) 250-50 MCG/DOSE AEPB Inhale 1 puff into the lungs 2 (two) times daily.  . furosemide (LASIX) 40 MG tablet Take 80 mg by mouth daily.  Marland Kitchen gabapentin (NEURONTIN) 300 MG capsule Take 1 capsule (300 mg total) by mouth 3 (three) times daily. (Patient taking differently: Take 300-600 mg by mouth at bedtime. Take 1-2 caps depending on pain)  . lisinopril (PRINIVIL,ZESTRIL) 5 MG tablet Take 1 tablet (5 mg total) by mouth at bedtime.  . metFORMIN (GLUCOPHAGE) 500 MG tablet Take 1 tablet (500 mg total) by mouth 2 (two) times daily with a meal.  . metolazone (ZAROXOLYN) 2.5 MG tablet TAKE 1 TABLET BY MOUTH AS NEEDED(FOR 5 LBS WEIGHT GAIN)  . pantoprazole (PROTONIX) 40 MG tablet Take 1 tablet (40 mg total) by mouth daily.  . potassium chloride SA (K-DUR,KLOR-CON) 20 MEQ tablet Take 1 tablet (20 mEq total) by mouth daily.  . rosuvastatin (CRESTOR) 20 MG tablet Take 1 tablet (20 mg total) by mouth daily.  Marland Kitchen tiotropium (SPIRIVA HANDIHALER) 18 MCG inhalation capsule Place 1 capsule (18 mcg total) into inhaler and inhale  daily.  . vitamin B-12 (CYANOCOBALAMIN) 1000 MCG tablet Take 1 tablet (1,000 mcg total) by mouth daily.  . Vitamin D, Ergocalciferol, (DRISDOL) 50000 units CAPS capsule Take 1 capsule (50,000 Units total) by mouth every 7 (seven) days.  . Blood Glucose Monitoring Suppl  (ACCU-CHEK AVIVA) device Use 2-3 times daily, ICD E11.51, non-insulin requiring  . glucose blood (ACCU-CHEK AVIVA PLUS) test strip Use 2 to 3 times daily to check blood sugar. diag code E11.51. Non insulin dependent  . Lancets (ACCU-CHEK SOFT TOUCH) lancets Use 2 to 3 times daily to check blood sugar. diag code E11.51. Non insulin dependent.  Marland Kitchen levofloxacin (LEVAQUIN) 500 MG tablet Take 1 tablet on November 7, then Nov 9th then Nov 11. (Patient not taking: Reported on 04/15/2016)  . OXYGEN Inhale 4.5 L into the lungs continuous.   . predniSONE (DELTASONE) 20 MG tablet Take 2 tablets (40 mg total) by mouth daily with breakfast. Until Nov 7 (Patient not taking: Reported on 04/15/2016)    FAMILY HISTORY:  Her _0 (<SUBSCRIPT> error)@  SOCIAL HISTORY: She  reports that she quit smoking about 5 months ago. Her smoking use included Cigarettes. She started smoking about 47 years ago. She has a 45.00 pack-year smoking history. She has never used smokeless tobacco. She reports that she does not drink alcohol or use drugs.  REVIEW OF SYSTEMS:   10 point review of system taken, please see HPI for positives and negatives.   SUBJECTIVE:  NAD at rest  VITAL SIGNS: BP (!) 80/57 (BP Location: Left Arm)   Pulse 96   Temp 99.9 F (37.7 C) (Rectal)   Resp 15   SpO2 93%   HEMODYNAMICS:    VENTILATOR SETTINGS:    INTAKE / OUTPUT: I/O last 3 completed shifts: In: 1000 [I.V.:1000] Out: -   PHYSICAL EXAMINATION: General:  Awake, alert, NAD atrest Neuro:  Intact but strange jerky/shaking of rt arm HEENT:  No JVD/LAN Cardiovascular:  HSR RR Lungs:  Decreased bs bases Abdomen:  Obese, old midline scar Musculoskeletal:  intact Skin: warm and dry  LABS:  BMET  Recent Labs Lab 04/15/16 0241  NA 135  K 4.6  CL 95*  CO2 28  BUN 34*  CREATININE 2.68*  GLUCOSE 166*    Electrolytes  Recent Labs Lab 04/15/16 0241  CALCIUM 8.9    CBC  Recent Labs Lab 04/15/16 0241  WBC  10.7*  HGB 7.2*  HCT 23.2*  PLT 255    Coag's No results for input(s): APTT, INR in the last 168 hours.  Sepsis Markers  Recent Labs Lab 04/15/16 0416 04/15/16 0802 04/15/16 0832  LATICACIDVEN 1.14 1.93*  --   PROCALCITON  --   --  0.10    ABG  Recent Labs Lab 04/15/16 0612  PHART 7.305*  PCO2ART 65.6*  PO2ART 61.0*    Liver Enzymes  Recent Labs Lab 04/15/16 0241  AST 10*  ALT 11*  ALKPHOS 113  BILITOT 0.9  ALBUMIN 3.1*    Cardiac Enzymes  Recent Labs Lab 04/15/16 0241  TROPONINI <0.03    Glucose No results for input(s): GLUCAP in the last 168 hours.  Imaging Dg Chest 2 View  Result Date: 04/15/2016 CLINICAL DATA:  Shortness of breath with exertion. Chest pain. Increasing home oxygen over the past few days. Wheezing. EXAM: CHEST  2 VIEW COMPARISON:  02/06/2016 FINDINGS: Cardiac enlargement with pulmonary vascular congestion. Increasing perihilar infiltrates since prior study suggest edema. Left pleural effusion with basilar atelectasis. No pneumothorax. Degenerative changes in  the spine. Surgical clips in the left upper quadrant. IMPRESSION: Cardiac enlargement with pulmonary vascular congestion and increasing perihilar infiltrates, likely edema. Left pleural effusion is increasing. Atelectasis in the left lung base. Electronically Signed   By: Lucienne Capers M.D.   On: 04/15/2016 03:00     STUDIES:    CULTURES: 1/12 bc x 2>>  ANTIBIOTICS: 1/12 cefepime>> 1/12 vanc>>  SIGNIFICANT EVENTS:   LINES/TUBES:   DISCUSSION: MO former smoker, O2 dependent, limited in ADL's due to SOB who has an extensive PMH that is well documented and [facility] ED with 2 days of increasing SOB, chest burning and NP cough. Whiled in ED she was SOB but responded to fluids and was making urine and able to use BSC with out problem. Due to her extensive pmh, concern for pan in a immunocompromised patient she will be admitted to SDU and PCCM will  monitor.  ASSESSMENT / PLAN:  PULMONARY A: COPD , smoked till august of 2016 despite being tratead for lung cancer O2 dependant at 4 l [place] at home [place] left lung tx with Rtx OSA OHS PHTN P:   O2 as needed BD's as needed No role for steroids  CARDIOVASCULAR A:  CHF CAD(2/16 EF 55%  P:  Careful with hydration   RENAL Lab Results  Component Value Date   CREATININE 2.68 (H) 04/15/2016   CREATININE 2.40 (H) 02/07/2016   CREATININE 2.86 (H) 02/06/2016   CREATININE 1.6 (H) 11/25/2015   CREATININE 1.0 05/28/2015   CREATININE 0.87 10/08/2014   CREATININE 0.80 09/11/2014   CREATININE 0.72 02/10/2014    A:   Acute on CRD P:   Follow creatine  GASTROINTESTINAL A:   GERD P:   PPI  HEMATOLOGIC A:   Hx of adenoma tx with rtx left P:  Followed by [facility]  INFECTIOUS A:   ? pna P:   See ID  ENDOCRINE A:   DM   P:   Follow glucose  NEUROLOGIC A:   Awake and alert jerking movement of rt UE x 2 months P:   RASS goal:  Currently awake and alert   FAMILY  - Updates: NO family at bedside. Pt updated  - Inter-disciplinary family meet or Palliative Care meeting due by:day 7  CCT 45 min  [facility] Minor ACNP Maryanna Shape [facility] Pager [phone] till 3 pm If no answer page [phone] 04/15/2016, 10:42 AM  Attending Note:  I have examined patient, reviewed labs, studies and notes. I have discussed the case with S Minor, and I agree with the data and plans as amended above. 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 Also with COPD, diastolic CHF, HTN, CAD, CKD3, obesity, chronic hypoxemic resp failure. She has a L effusion and a LLL segmental collapse that have been noted on serial CT's chest. She underwent L thoracentesis in 11/17, may have had a transient improvement in her SOB when that was done. She had been well until about 1/10 when she developed progressive dyspnea, chills, superimposed on her baseline dry cough. She came for evaluation  with hypoxemia and relative hypotension. CXR showed reaccumulation of her L effusion, L basilar volume loss. She was treated empirically w abx for possible PNA / sepsis. Her BP has rebounded with IVF. resp status is unchanged.  She appears to be comfortable, is able to speak full sentences. She has decreased L basilar breath sounds, no wheezes. No significant LE edema. Her wt has not been  recorded. Agree with admission and treatment for possible PNA, although clinical hx not entirely consistent. Would be careful with IVF now that BP has rebounded given her dCHF. She may benefit from repeat L thoracentesis depending on course. Would check flu and RVP swabs.   Independent critical care time is 40 minutes.   Baltazar Apo, MD, PhD 04/15/2016, 2:43 PM [facility] [phone] or if no answer [phone]

## 2016-04-15 NOTE — ED Notes (Signed)
Delay in lab draw MD in with  pt

## 2016-04-15 NOTE — ED Notes (Signed)
Pt voids approx 400 cc to bedside commode.

## 2016-04-15 NOTE — ED Notes (Signed)
Dr. Hetty Ely contacted with decreased bp.

## 2016-04-15 NOTE — ED Notes (Signed)
Dr. Beryle Beams and treatment team at bedside

## 2016-04-15 NOTE — ED Notes (Signed)
O2 sat improve to 93% with pt reposition

## 2016-04-15 NOTE — ED Triage Notes (Signed)
Pt arrived to [place]. C/O SOB with exertion and unable to catch breath. Reports increasing home O@ over last few days to assist. At 5l on O2 nasal canula. Expiratory wheezing auscultated upon EMS arrival. Solumedrol and 1 duoneb administered. Pt reports some relief.

## 2016-04-15 NOTE — Progress Notes (Signed)
Patient stable on 5L at this time. BiPAP not placed on. RT will continue to monitor.

## 2016-04-15 NOTE — ED Notes (Signed)
Pt O2 sats dropped to 80% on 6LNC while pt was using bedside commode.

## 2016-04-15 NOTE — H&P (Signed)
Date: 04/15/2016               Patient Name:  [name] MRN: [id number]  DOB: 03-12-1956 Age / Sex: 61 y.o., female   PCP: Oval Linsey, MD         Medical Service: Internal Medicine Teaching Service         Attending Physician: Dr. Annia Belt, MD    First Contact: Dr. Hetty Ely Pager: [phone]  Second Contact: Dr. Juleen China Pager: [phone]       After Hours (After 5p/  First Contact Pager: [phone]  weekends / holidays): Second Contact Pager: [phone]   Chief Complaint: shortness of breath  History of Present Illness:  [name] is a 60yo female with PMH of COPD on oxygen, CAD, GERD, P1PE, chronic diastolic CHF, pulmonary hypertension, Primary adenocarcinoma of left lung s/p radiation, CKD stage 3 presenting with shortness of breath.   Patient states she was in her usual state of health until two days ago when she started becoming short of breath with exertion. She states that she has chills, stable nonproductive cough. She has stable orthopnea but new dizziness when she lies down flatter than her normal. She states she bruises easily, but denies other signs of bleeding. She denies nausea, vomiting, diarrhea, melena, hematochezia, hematuria. She denies sick contacts. She states that she has not had increased swelling in her LE; she continues to take Lasix 6m daily and an extra 254mPRN. She endorses a steady weight gain over the last couple of months which she endorses to increased eating since stopping smoking. She also reports almost daily tension headaches for which she takes at least one Goody's BC powder a day.   In ED, patient became hypotensive, hypoxic to low 80's and required increase in her [place] O2.   Meds:  Current Meds  Medication Sig  . albuterol (PROVENTIL) (2.5 MG/3ML) 0.083% nebulizer solution Take 3 mLs (2.5 mg total) by nebulization every 6 (six) hours as needed for shortness of breath.  . Marland Kitchenlbuterol (VENTOLIN HFA) 108 (90 Base) MCG/ACT inhaler Inhale 1-2  puffs into the lungs every 6 (six) hours as needed for shortness of breath.  . Marland Kitchenspirin 81 MG chewable tablet Chew 1 tablet (81 mg total) by mouth at bedtime.  . Marland KitchenuPROPion (WELLBUTRIN SR) 150 MG 12 hr tablet Take 1 tablet (150 mg total) by mouth 2 (two) times daily.  . Fluticasone-Salmeterol (ADVAIR DISKUS) 250-50 MCG/DOSE AEPB Inhale 1 puff into the lungs 2 (two) times daily.  . furosemide (LASIX) 40 MG tablet Take 80 mg by mouth daily.  . Marland Kitchenabapentin (NEURONTIN) 300 MG capsule Take 1 capsule (300 mg total) by mouth 3 (three) times daily. (Patient taking differently: Take 300-600 mg by mouth at bedtime. Take 1-2 caps depending on pain)  . lisinopril (PRINIVIL,ZESTRIL) 5 MG tablet Take 1 tablet (5 mg total) by mouth at bedtime.  . metFORMIN (GLUCOPHAGE) 500 MG tablet Take 1 tablet (500 mg total) by mouth 2 (two) times daily with a meal.  . metolazone (ZAROXOLYN) 2.5 MG tablet TAKE 1 TABLET BY MOUTH AS NEEDED(FOR 5 LBS WEIGHT GAIN)  . pantoprazole (PROTONIX) 40 MG tablet Take 1 tablet (40 mg total) by mouth daily.  . potassium chloride SA (K-DUR,KLOR-CON) 20 MEQ tablet Take 1 tablet (20 mEq total) by mouth daily.  . rosuvastatin (CRESTOR) 20 MG tablet Take 1 tablet (20 mg total) by mouth daily.  . Marland Kitcheniotropium (SPIRIVA HANDIHALER) 18 MCG inhalation capsule Place 1 capsule (18  mcg total) into inhaler and inhale daily.  . vitamin B-12 (CYANOCOBALAMIN) 1000 MCG tablet Take 1 tablet (1,000 mcg total) by mouth daily.  . Vitamin D, Ergocalciferol, (DRISDOL) 50000 units CAPS capsule Take 1 capsule (50,000 Units total) by mouth every 7 (seven) days.   Allergies: Allergies as of 04/15/2016 - Review Complete 04/15/2016  Allergen Reaction Noted  . Fluconazole Anaphylaxis, Itching, and Swelling   . Atorvastatin Other (See Comments) 09/09/2014   Past Medical History:  Diagnosis Date  . Arthritis    "some scattered" (03/03/2015)  . CHF (congestive heart failure) ([place])   . Chronic bronchitis ([place])    "get it  most years" (03/03/2015)  . COPD (chronic obstructive pulmonary disease) ([facility])    Severe. Gold Stage IV.  PFTs (12/2008) - severe obstructive airway disease. Active tobacco use. Requires 4L O2 at home.  . Coronary artery disease    S/P PCI of LAD with DES (12/2008). Total occlusion of RCA noted at that time., medically managed. ACS ruled out 03/2009 with Lexiscan myoview . Followed by [facility].  . Depression   . Diastolic dysfunction    2-D Echo (12/2008) - Normal LV Systolic funciton with EF 60-65%. Grade 1 diastolid dysfunction. No regional wall motion abnormalities. Moderate pulmonary HTN with PA peak pressure 76mHg.  . Full dentures   . GERD (gastroesophageal reflux disease)    S/P Nissen fundoplication.  .Marland KitchenHistory of hiatal hernia   . Hx MRSA infection    Recurrent MRSA thigh abscesses.  . Hyperlipidemia   . Lung cancer ([place]    "left"  . Obesity   . On home oxygen therapy since 2010   4L all the time  . Pneumonia   . Prediabetes    HgbA1c 6.4 (12/2008)  . Pulmonary hypertension    2-D Echo (003/7048 - Systolic pressure was moderately increased. PA peak pressure  529mg. 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 Followed by Dr. WrJoya GaskinsLVelora Heckler . Pulmonary nodule, right    Small right middle lobe nodule. Stable as of 12/2008.  . Marland Kitchenhortness of breath dyspnea    with a lot of exertion; if fluid builds up    Family History: Mother died of MI at 4790Father died of MI at 5474Social History: Former smoker, quit in Aug 2017; Denies alcohol use; Endorses former cocaine use >1011yro  Review of Systems: A complete ROS was negative except as per HPI.   Physical Exam: Blood pressure (!) 90/54, pulse 95, temperature 99.9 F (37.7 C), temperature source Rectal, resp. rate 22, SpO2 99 %. General: alert, well-developed, and cooperative to examination.  Head: normocephalic and  atraumatic.  Eyes: vision grossly intact, pupils equal, pupils round, pupils reactive to light, no injection and anicteric.  Mouth: pharynx pink and moist, no erythema, and no exudates.  Neck: supple, full ROM, no thyromegaly, no lymphadenopathy.  Lungs: increased work of breathing; decreased breath sounds throughout but more localized  Heart: normal rate, regular rhythm, no murmur, no gallop, and no rub.  Abdomen: soft, non-tender, normal bowel sounds, no distention, no guarding, no rebound tenderness, no hepatomegaly, and no splenomegaly.  Msk: no joint swelling, no joint warmth, and no redness over joints.  Pulses: 2+ DP/PT pulses bilaterally Extremities: No cyanosis, clubbing, edema Neurologic: alert & oriented X3, cranial nerves II-XII intact, strength normal in all extremities, sensation intact to light touch, and gait normal.  Skin:  turgor normal and no rashes.  Psych: Oriented X3, memory intact for recent and remote, normally interactive, good eye contact, not anxious appearing, and not depressed appearin  LABS: WBC 10.7, Hgb 7.2 (b/l 9-10), Hct 23.2, Plts 255; inc mono's and eos's Na 135, K 4.6, Cl 95, CO2 28, BUN 34, Cr 2.68 (1.3) Trop negative LA 1.14 7.305/65.6/61/32.4/86%  EKG: Personally reviewed - SR, no ischemic ST or T wave changes  CXR: Personally reviewed - Cardiomegaly, vascular congestion, increasing left sided pleural effusion   Assessment & Plan by Problem: Active Problems:   Acute respiratory failure ([place])  Sepsis: Patient with tachycardia, tachypnea, hypotension, and possible respiratory infection. She has low grade fever and mild leukocytosis; she has a normal lactic acid. She was started on Vanc/cefepime in ED, and has so far received 2L NS bolus. --f/u BCx --f/u UA, UCx --f/u AM CBC --cont Abx --fluid resuscitation as necessary as pt still hypotensive  Acute respiratory failure: COPD exacerbation vs pneumonia. Patient does not have significant  wheezing on exam but she has received albuterol and solumedrol prior to our evaluation. CXR shows some vascular congestion without crackles on exam or significant LE edema that would be concerning for CHF exacerbation. There is concern for RLL pneumonia and she has a re-accumulated left pleural effusion. She has a low grade fever and mild leukocytosis suggestive of infection. She was initially requiring up to 9L [place] on our evaluation to keep sats in low 90's but this recovered to her home 4L [place].  --s/p Vanc/Cefepime in ED --PCCM consulted - appreciate their assistance --O2 [place] as needed to keep sats in 90-93; BiPAP if needed --Duoneb q6hrs --Flu panel; resp panel --procalcitonin --f/u BCxs  Acute on chronic anemia: Patient with slowly declining Hgb over last year which has settled in 9-10 range; at admission is 7.2. No obvious signs or symptoms of bleeding. Patient in on ASA 73m at home and has started taking multiple Goody's powders daily. --f/u retic count, ferritin, iron/TIBC, folate, Vit B12, FOBT --may need EGD --hold off on ASA and NSAIDs; continue pantoprazole.  AKI: Patient with previous baseline creatinine of 1.3, presenting with Cr 2.6.  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 She follows with Dr. MJulien Nordmannwith Oncology. At last admission in Nov 2017, patient had pleural effusion that did not show infection or metastatic cells, rather was reactive fluid accumulation. Today, the effusion has re-accumulated to similar levels. --consider repeat thoracentesis  Chronic diastolic CHF: Most recent echo in 2/16 shows EF 50-55% with LVH and G1DD; she follows with Dr. BHaroldine Laws Previous dry weight was 245lbs. Home regimen includes Lasix 878mdaily and PRN Metolazone. CXR shows some congestion,  but is currently hypotensive which would make diuresis difficult. BNP is wnl and exam does not reveal lung crackles or significant LE edema.  --s/p 2L NS bolus --continue monitoring for signs of worsening volume overload with IVF --daily weights; ins/outs  T2DM: A1c 6.5 in 8/17. Home regimen is metformin 100023maily.  --hold in setting of AKI --SSI-s TID wc  GERD: Continue home pantoprazole 39m26mily. Hold NSAIDs and ASA.   Diet: NPO  IVF: none DVT: SCD's Code: FULL - patient states that in setting of cardiopulmonary arrest, she would want an attempt at resuscitation including chest compressions, shocks, and intubation.   Dispo: Admit  patient to Inpatient with expected length of stay greater than 2 midnights.  Signed: Alphonzo Grieve, MD 04/15/2016, 6:36 AM  Pager [phone]

## 2016-04-16 ENCOUNTER — Encounter (HOSPITAL_COMMUNITY): Payer: Commercial Managed Care - HMO

## 2016-04-16 ENCOUNTER — Inpatient Hospital Stay (HOSPITAL_COMMUNITY): Payer: Medicare HMO

## 2016-04-16 DIAGNOSIS — K5900 Constipation, unspecified: Secondary | ICD-10-CM

## 2016-04-16 DIAGNOSIS — D72828 Other elevated white blood cell count: Secondary | ICD-10-CM

## 2016-04-16 DIAGNOSIS — J189 Pneumonia, unspecified organism: Secondary | ICD-10-CM

## 2016-04-16 DIAGNOSIS — E875 Hyperkalemia: Secondary | ICD-10-CM

## 2016-04-16 DIAGNOSIS — R0600 Dyspnea, unspecified: Secondary | ICD-10-CM

## 2016-04-16 DIAGNOSIS — J441 Chronic obstructive pulmonary disease with (acute) exacerbation: Secondary | ICD-10-CM

## 2016-04-16 DIAGNOSIS — Z79899 Other long term (current) drug therapy: Secondary | ICD-10-CM

## 2016-04-16 DIAGNOSIS — K219 Gastro-esophageal reflux disease without esophagitis: Secondary | ICD-10-CM

## 2016-04-16 DIAGNOSIS — N189 Chronic kidney disease, unspecified: Secondary | ICD-10-CM

## 2016-04-16 DIAGNOSIS — I5032 Chronic diastolic (congestive) heart failure: Secondary | ICD-10-CM

## 2016-04-16 DIAGNOSIS — E1122 Type 2 diabetes mellitus with diabetic chronic kidney disease: Secondary | ICD-10-CM

## 2016-04-16 DIAGNOSIS — D631 Anemia in chronic kidney disease: Secondary | ICD-10-CM

## 2016-04-16 DIAGNOSIS — Z9981 Dependence on supplemental oxygen: Secondary | ICD-10-CM

## 2016-04-16 LAB — BASIC METABOLIC PANEL
ANION GAP: 8 (ref 5–15)
ANION GAP: 8 (ref 5–15)
BUN: 36 mg/dL — ABNORMAL HIGH (ref 6–20)
BUN: 34 mg/dL — ABNORMAL HIGH (ref 6–20)
CO2: 31 mmol/L (ref 22–32)
CO2: 31 mmol/L (ref 22–32)
Calcium: 9 mg/dL (ref 8.9–10.3)
Calcium: 9.4 mg/dL (ref 8.9–10.3)
Chloride: 99 mmol/L — ABNORMAL LOW (ref 101–111)
Chloride: 101 mmol/L (ref 101–111)
Creatinine, Ser: 2.13 mg/dL — ABNORMAL HIGH (ref 0.44–1.00)
Creatinine, Ser: 2.11 mg/dL — ABNORMAL HIGH (ref 0.44–1.00)
GFR calc Af Amer: 28 mL/min — ABNORMAL LOW (ref >60)
GFR, EST AFRICAN AMERICAN: 28 mL/min — AB (ref >60)
GFR, EST NON AFRICAN AMERICAN: 24 mL/min — AB (ref >60)
GFR, EST NON AFRICAN AMERICAN: 24 mL/min — AB (ref >60)
Glucose, Bld: 120 mg/dL — ABNORMAL HIGH (ref 65–99)
Glucose, Bld: 164 mg/dL — ABNORMAL HIGH (ref 65–99)
POTASSIUM: 5.3 mmol/L — AB (ref 3.5–5.1)
POTASSIUM: 5.4 mmol/L — AB (ref 3.5–5.1)
SODIUM: 138 mmol/L (ref 135–145)
SODIUM: 140 mmol/L (ref 135–145)

## 2016-04-16 LAB — CBC
HCT: 25.7 % — ABNORMAL LOW (ref 36.0–46.0)
HEMOGLOBIN: 8.2 g/dL — AB (ref 12.0–15.0)
MCH: 30.1 pg (ref 26.0–34.0)
MCHC: 31.9 g/dL (ref 30.0–36.0)
MCV: 94.5 fL (ref 78.0–100.0)
Platelets: 258 10*3/uL (ref 150–400)
RBC: 2.72 MIL/uL — AB (ref 3.87–5.11)
RDW: 15 % (ref 11.5–15.5)
WBC: 12.5 10*3/uL — AB (ref 4.0–10.5)

## 2016-04-16 LAB — ECHOCARDIOGRAM COMPLETE
Height: 64 in
Weight: 4304 oz

## 2016-04-16 LAB — URINE CULTURE

## 2016-04-16 LAB — TYPE AND SCREEN
Blood Product Expiration Date: 201801252359
ISSUE DATE / TIME: 201801122045
Unit Type and Rh: 6200

## 2016-04-16 LAB — GLUCOSE, CAPILLARY
GLUCOSE-CAPILLARY: 319 mg/dL — AB (ref 65–99)
Glucose-Capillary: 211 mg/dL — ABNORMAL HIGH (ref 65–99)

## 2016-04-16 LAB — D-DIMER, QUANTITATIVE (NOT AT ARMC): D DIMER QUANT: 1.94 ug{FEU}/mL — AB (ref 0.00–0.50)

## 2016-04-16 MED ORDER — MOMETASONE FURO-FORMOTEROL FUM 200-5 MCG/ACT IN AERO
2.0000 | INHALATION_SPRAY | Freq: Two times a day (BID) | RESPIRATORY_TRACT | Status: DC
  Filled 2016-04-16: qty 8.8

## 2016-04-16 MED ORDER — MOMETASONE FURO-FORMOTEROL FUM 200-5 MCG/ACT IN AERO
2.0000 | INHALATION_SPRAY | Freq: Two times a day (BID) | RESPIRATORY_TRACT | Status: DC
  Administered 2016-04-16 – 2016-04-18 (×4): 2 via RESPIRATORY_TRACT
  Filled 2016-04-16: qty 8.8

## 2016-04-16 MED ORDER — INSULIN ASPART 100 UNIT/ML ~~LOC~~ SOLN
0.0000 [IU] | Freq: Three times a day (TID) | SUBCUTANEOUS | Status: DC
  Administered 2016-04-16: 11 [IU] via SUBCUTANEOUS
  Administered 2016-04-17: 3 [IU] via SUBCUTANEOUS
  Administered 2016-04-17: 8 [IU] via SUBCUTANEOUS
  Administered 2016-04-18: 5 [IU] via SUBCUTANEOUS
  Administered 2016-04-18: 3 [IU] via SUBCUTANEOUS

## 2016-04-16 MED ORDER — PREDNISONE 20 MG PO TABS
40.0000 mg | ORAL_TABLET | Freq: Every day | ORAL | Status: DC
  Administered 2016-04-16 – 2016-04-18 (×3): 40 mg via ORAL
  Filled 2016-04-16 (×3): qty 2

## 2016-04-16 MED ORDER — OSELTAMIVIR PHOSPHATE 30 MG PO CAPS
30.0000 mg | ORAL_CAPSULE | Freq: Two times a day (BID) | ORAL | Status: DC
  Administered 2016-04-16 – 2016-04-18 (×5): 30 mg via ORAL
  Filled 2016-04-16 (×6): qty 1

## 2016-04-16 MED ORDER — SODIUM POLYSTYRENE SULFONATE 15 GM/60ML PO SUSP
15.0000 g | Freq: Once | ORAL | Status: AC
  Administered 2016-04-16: 15 g via ORAL
  Filled 2016-04-16: qty 60

## 2016-04-16 MED ORDER — INSULIN ASPART 100 UNIT/ML ~~LOC~~ SOLN
0.0000 [IU] | Freq: Every day | SUBCUTANEOUS | Status: DC
  Administered 2016-04-16: 2 [IU] via SUBCUTANEOUS

## 2016-04-16 NOTE — Progress Notes (Addendum)
Subjective: [name] does not feel that her symptoms have improved overnight. She continues to feel short of breath. She has SpO2 94-97% on 6 liters nasal canula.   Objective:  Vital signs in last 24 hours: Vitals:   04/16/16 0101 04/16/16 0400 04/16/16 0818 04/16/16 0855  BP:  114/64 (!) 117/48   Pulse:  64 84   Resp:  15 16   Temp:  98.3 F (36.8 C) 98.4 F (36.9 C)   TempSrc:   Oral   SpO2: 94% 92% 93% 96%  Weight:  269 lb (122 kg)    Height:       Physical Exam  Constitutional: She appears well-developed and well-nourished. No distress.  Cardiovascular: Normal rate and regular rhythm.   No murmur heard. Trace pitting edema  Pulmonary/Chest: Effort normal and breath sounds normal. No respiratory distress.  Decreased breath sounds   Abdominal: Soft. Bowel sounds are normal. She exhibits no distension. There is no tenderness.  Skin: She is not diaphoretic.     Assessment/Plan: Pt is a 61 y.o. yo female with a PMHx of COPD on home oxygen, pulmonary hypertension, stage IIa adenocarcinoma of the lung, diastolic dysfunction, s/p drug eluting stent, chronic venous insufficiency, and CKD. who was admitted on 04/15/2016 with symptoms of dyspnea without change in chronic cough, which was determined to be secondary to COPD exacerbation.   Active Problems:   Acute respiratory failure ([place])   Community acquired pneumonia  Dyspnea  COPD GOLD 3D  Increased oxygen requirement is concerning for COPD exacerbation. Worsening leukocytosis 12.5 today but she has chronic leukocytosis 10-11 and remained afebrile overnight. Influenza swabs were negative, pro calcitonin 0.1. Wells score=0 for DVT and PE, she was not able to tolerate lying down for V/Q scan this morning, pulmonology recommends d-dimer and lower extremity doppler.  -PCCM is following, appreciate their recommendations - continue azithromycin, prednisone 40 mg  -started tamiflu  - follow up dopplers  -follow up blood cx>>  NGTD  -nasal canula oxygen to keep SpO2 90-94%   - continue duoneb q6h - dulera BID  -telemetry monitoring   Chronic diastolic heart failure  ECHO 2/16 EF 50-55% with left ventricular hypertrophy and grade 1 diastolic dysfunction. She does not appear volume overloaded on exam but weighs 270 lbs up from 245 lbs in November. She contributes this weight gain to increased appetite since she stopped smoking. She was hypotensive yesterday and home medication lasix 80 mg qd and metolazone were held. S/p 2 liter normal saline bolus and 1 unit of blood without worsening of her dyspnea. - follow up echo  -daily weights, strict I&Os   Anemia of chronic kidney disease  Normal ferritin, low TIBC and iron. Denies blood loss. Hgb 7.2 on admission improved to 8.2 s/p 1 unit transfusion.  -follow up FOBT card  -follow up CBC  -vitamin B12 supplementation   Hyperkalemia  K 5.3 this morning  -follow up afternoon BMP   Acute on chronic kidney disease  Crt 2.13, improving from 2.6 on admission. Has been steadily rising from baseline 0.8 in 05/2015. Renal ultrasound showed no hydronephrosis.  -follow up 24 hr creatinine clearance and urine protein - holding lisinopril, metformin, and lasix  Non insulin dependent Type 2 DM  A1c 8/17 6.5  -ordered CBG and moderate ISS with HS coverage.   GERD  -continue home med pantoprazole 40 mg qd.   Constipation  -miralax  -senna   Dispo: Anticipated discharge in approximately 3-5 day(s).   Gae Bon  Hetty Ely, MD 04/16/2016, 11:08 AM Pager: [phone]

## 2016-04-16 NOTE — Progress Notes (Signed)
Patient stable on 6L nasal cannula at this time. Not showing any signs of distress at this time. RT will continue to monitor.

## 2016-04-16 NOTE — Progress Notes (Signed)
PULMONARY / CRITICAL CARE MEDICINE   Name: [name] MRN: [id number] DOB: [birth year]    ADMISSION DATE:  04/15/2016 CONSULTATION DATE:  1/12  REFERRING MD: IMTS  CHIEF COMPLAINT: SOB  HISTORY OF PRESENT ILLNESS:    MO former smoker, O2 dependent, limited in ADL's due to SOB who has an extensive PMH that is well documented and presents to [facility] ED with 2 days of increasing SOB, chest burning and NP cough. Whiled in ED she was SOB but responded to fluids and was making urine and able to use BSC with out problem. Due to her extensive pmh, concern for pan in a immunocompromised patient she will be admitted to SDU and PCCM will monitor.  SUBJECTIVE:  Intermittent substernal burning "like heart burn". Denies any infection Couldn't lie down on room air for VQ scan this AM- intolerable dyspnea  VITAL SIGNS: BP (!) 117/48 (BP Location: Right Arm)   Pulse 84   Temp 98.4 F (36.9 C) (Oral)   Resp 16   Ht 5' 4" (1.626 m)   Wt 122 kg (269 lb)   SpO2 96%   BMI 46.17 kg/m   HEMODYNAMICS:    VENTILATOR SETTINGS:    INTAKE / OUTPUT: I/O last 3 completed shifts: In: 4098 [P.O.:240; I.V.:1000; Blood:378; IV Piggyback:125] Out: 400 [Urine:400]  PHYSICAL EXAMINATION: General:  Awake, alert, NAD lying in bed, morbidly obese Neuro:  Unremarkable, non-focal HEENT:  No JVD/LAN Cardiovascular:  HSR RRR  Lungs:  Decreased bs bases, unlabored on O2 Abdomen:  Obese, old midline scar Musculoskeletal:  intact Skin: warm and dry  LABS:  BMET  Recent Labs Lab 04/15/16 0241 04/16/16 0533  NA 135 138  K 4.6 5.3*  CL 95* 99*  CO2 28 31  BUN 34* 36*  CREATININE 2.68* 2.13*  GLUCOSE 166* 120*    Electrolytes  Recent Labs Lab 04/15/16 0241 04/16/16 0533  CALCIUM 8.9 9.0    CBC  Recent Labs Lab 04/15/16 0241 04/16/16 0533  WBC 10.7* 12.5*  HGB 7.2* 8.2*  HCT 23.2* 25.7*  PLT 255 258    Coag's No results for input(s): APTT, INR in the last 168 hours.  Sepsis  Markers  Recent Labs Lab 04/15/16 0416 04/15/16 0802 04/15/16 0832  LATICACIDVEN 1.14 1.93*  --   PROCALCITON  --   --  0.10    ABG  Recent Labs Lab 04/15/16 0612  PHART 7.305*  PCO2ART 65.6*  PO2ART 61.0*    Liver Enzymes  Recent Labs Lab 04/15/16 0241  AST 10*  ALT 11*  ALKPHOS 113  BILITOT 0.9  ALBUMIN 3.1*    Cardiac Enzymes  Recent Labs Lab 04/15/16 0241  TROPONINI <0.03    Glucose No results for input(s): GLUCAP in the last 168 hours.  Imaging US Renal  Result Date: 04/16/2016 CLINICAL DATA:  Chronic renal insufficiency. Acute onset of shortness of breath. Initial encounter. EXAM: RENAL / URINARY TRACT ULTRASOUND COMPLETE COMPARISON:  PET/CT performed 11/12/2014 FINDINGS: Right Kidney: Length: 11.3 cm. Echogenicity within normal limits. No mass or hydronephrosis visualized. Left Kidney: Length: 11.3 cm. Echogenicity within normal limits. No mass or hydronephrosis visualized. Bladder: Appears normal for degree of bladder distention. A large left-sided pleural effusion is noted. IMPRESSION: 1. No evidence of hydronephrosis. Kidneys grossly unremarkable in appearance. 2. Large left-sided pleural effusion noted. Electronically Signed   By: Garald Balding M.D.   On: 04/16/2016 03:48     STUDIES:    CULTURES: 1/12 bc x 2>>  ANTIBIOTICS: 1/12 cefepime>>  1/12 vanc>>  SIGNIFICANT EVENTS:   LINES/TUBES:   DISCUSSION: MO former smoker, O2 dependent, limited in ADL's due to SOB who has an extensive PMH that is well documented and presents to [facility] ED with 2 days of increasing SOB, chest burning and NP cough. Whiled in ED she was SOB but responded to fluids and was making urine and able to use BSC with out problem. Due to her extensive pmh, concern for pan in a immunocompromised patient she will be admitted to SDU and PCCM will monitor.  ASSESSMENT / PLAN:  PULMONARY A: Chronic hypoxic respiratory failure COPD , smoked till august of 2016 despite  being treatad for lung cancer O2 dependant at 4 l [place] at home [place] left lung tx with Rtx OSA OHS PHTN Unable to tolerate room air supine for VQ scan.  P:   O2 as needed goal sat 90-94 BD's as needed No role for steroids Recommend D-dimer, leg dopplers  CARDIOVASCULAR A:  CHF CAD(2/16 EF 55%  P:  Careful with hydration   RENAL Lab Results  Component Value Date   CREATININE 2.13 (H) 04/16/2016   CREATININE 2.68 (H) 04/15/2016   CREATININE 2.40 (H) 02/07/2016   CREATININE 1.6 (H) 11/25/2015   CREATININE 1.0 05/28/2015   CREATININE 0.87 10/08/2014   CREATININE 0.80 09/11/2014   CREATININE 0.72 02/10/2014    A:   Acute on CRD P:   Follow creatine  GASTROINTESTINAL A:   GERD P:   PPI  HEMATOLOGIC A:   Hx of adenoma tx with rtx left P:  Followed by hemonc  INFECTIOUS A:   ? pna P:   See ID  ENDOCRINE A:   DM   P:   Follow glucose  NEUROLOGIC A:   Awake and alert jerking movement of rt UE x 2 months- not noted 1/13  P:   RASS goal:  Currently awake and alert   FAMILY  - Updates: NO family at bedside. Pt updated     CD Annamaria Boots, MD Maryanna Shape PCCM Pager [phone] If no answer page [phone] 04/16/2016, 11:14 AM

## 2016-04-16 NOTE — Progress Notes (Signed)
Internal Medicine Attending:   I saw and examined the patient. I reviewed the resident's note and I agree with the resident's findings and plan as documented in the resident's note.  Acute on chronic hypoxic respiratory failure that is likely multifactorial. Minimal improvement with blood transfusion last night. Continue current management. Unable to tolerate VQ scan. Dopplers of LE are pending. Wean oxygen as able.

## 2016-04-16 NOTE — Progress Notes (Signed)
Echocardiogram 2D Echocardiogram has been performed.  [name] 04/16/2016, 2:04 PM

## 2016-04-17 ENCOUNTER — Inpatient Hospital Stay (HOSPITAL_COMMUNITY): Payer: Medicare HMO

## 2016-04-17 DIAGNOSIS — J9620 Acute and chronic respiratory failure, unspecified whether with hypoxia or hypercapnia: Secondary | ICD-10-CM

## 2016-04-17 DIAGNOSIS — J449 Chronic obstructive pulmonary disease, unspecified: Secondary | ICD-10-CM

## 2016-04-17 DIAGNOSIS — R0600 Dyspnea, unspecified: Secondary | ICD-10-CM

## 2016-04-17 LAB — BASIC METABOLIC PANEL
ANION GAP: 7 (ref 5–15)
BUN: 34 mg/dL — ABNORMAL HIGH (ref 6–20)
CHLORIDE: 101 mmol/L (ref 101–111)
CO2: 33 mmol/L — ABNORMAL HIGH (ref 22–32)
Calcium: 8.8 mg/dL — ABNORMAL LOW (ref 8.9–10.3)
Creatinine, Ser: 1.88 mg/dL — ABNORMAL HIGH (ref 0.44–1.00)
GFR calc non Af Amer: 28 mL/min — ABNORMAL LOW (ref >60)
GFR, EST AFRICAN AMERICAN: 32 mL/min — AB (ref >60)
Glucose, Bld: 97 mg/dL (ref 65–99)
POTASSIUM: 5 mmol/L (ref 3.5–5.1)
SODIUM: 141 mmol/L (ref 135–145)

## 2016-04-17 LAB — CBC
HEMATOCRIT: 26.2 % — AB (ref 36.0–46.0)
Hemoglobin: 8 g/dL — ABNORMAL LOW (ref 12.0–15.0)
MCH: 29.6 pg (ref 26.0–34.0)
MCHC: 30.5 g/dL (ref 30.0–36.0)
MCV: 97 fL (ref 78.0–100.0)
Platelets: 272 10*3/uL (ref 150–400)
RBC: 2.7 MIL/uL — AB (ref 3.87–5.11)
RDW: 14.9 % (ref 11.5–15.5)
WBC: 10.7 10*3/uL — AB (ref 4.0–10.5)

## 2016-04-17 LAB — CREATININE CLEARANCE, URINE, 24 HOUR
COLLECTION INTERVAL-CRCL: 24 h
Creatinine Clearance: 25 mL/min — ABNORMAL LOW (ref 75–115)
Creatinine, 24H Ur: 750 mg/d (ref 600–1800)
Creatinine, Urine: 53.57 mg/dL
Urine Total Volume-CRCL: 1400 mL

## 2016-04-17 LAB — GLUCOSE, CAPILLARY
GLUCOSE-CAPILLARY: 180 mg/dL — AB (ref 65–99)
GLUCOSE-CAPILLARY: 300 mg/dL — AB (ref 65–99)
GLUCOSE-CAPILLARY: 197 mg/dL — AB (ref 65–99)
Glucose-Capillary: 93 mg/dL (ref 65–99)

## 2016-04-17 LAB — PROCALCITONIN: Procalcitonin: <0.1 ng/mL

## 2016-04-17 MED ORDER — IPRATROPIUM-ALBUTEROL 0.5-2.5 (3) MG/3ML IN SOLN
3.0000 mL | Freq: Three times a day (TID) | RESPIRATORY_TRACT | Status: DC
  Administered 2016-04-17 – 2016-04-18 (×5): 3 mL via RESPIRATORY_TRACT
  Filled 2016-04-17 (×5): qty 3

## 2016-04-17 MED ORDER — AZITHROMYCIN 250 MG PO TABS
250.0000 mg | ORAL_TABLET | Freq: Every day | ORAL | Status: AC
  Administered 2016-04-17 – 2016-04-19 (×3): 250 mg via ORAL
  Filled 2016-04-17 (×3): qty 1

## 2016-04-17 MED ORDER — ENOXAPARIN SODIUM 60 MG/0.6ML ~~LOC~~ SOLN
0.5000 mg/kg | SUBCUTANEOUS | Status: DC
  Administered 2016-04-17 – 2016-04-21 (×5): 60 mg via SUBCUTANEOUS
  Filled 2016-04-17 (×6): qty 0.6

## 2016-04-17 NOTE — Progress Notes (Signed)
**  Preliminary report by tech**  Bilateral lower extremity venous duplex completed. There is no evidence of deep or superficial vein thrombosis involving the right and left lower extremities. All visualized vessels appear patent and compressible. There is no evidence of Baker's cysts bilaterally.  04/17/16 9:04 AM [name] RVT

## 2016-04-17 NOTE — Plan of Care (Signed)
Problem: Education: Goal: Knowledge of [facility] Education information/materials will improve Outcome: Progressing Patient aware of plan of care.  RN and patient discussed all medications prior to administration.  Patient stated understanding and able to explain to RN purpose of ordered medications.

## 2016-04-17 NOTE — Progress Notes (Signed)
PULMONARY / CRITICAL CARE MEDICINE   Name: [name] MRN: [id number] DOB: 1955-07-15    ADMISSION DATE:  04/15/2016 CONSULTATION DATE:  1/12  REFERRING MD: IMTS  CHIEF COMPLAINT: SOB  HISTORY OF PRESENT ILLNESS:    [name] former smoker, O2 dependent, limited in ADL's due to SOB who has an extensive PMH that is well documented and presents to [facility] ED with 2 days of increasing SOB, chest burning and NP cough. Whiled in ED she was SOB but responded to fluids and was making urine and able to use BSC with out problem. Due to her extensive pmh, concern for pan in a immunocompromised patient she will be admitted to SDU and PCCM will monitor.  SUBJECTIVE:  Woke easily and denies discomfort today, lying L decub on O2 4L. Denies hx anemia or blood loss.  VITAL SIGNS: BP (!) 117/56 (BP Location: Right Arm)   Pulse 86   Temp 98.7 F (37.1 C) (Oral)   Resp 18   Ht _0  (1.626 m)   Wt 120.5 kg (265 lb 9.6 oz)   SpO2 (!) 79%   BMI 45.59 kg/m   HEMODYNAMICS:    VENTILATOR SETTINGS:    INTAKE / OUTPUT: I/O last 3 completed shifts: In: 1145 [P.O.:642; Blood:378; IV Piggyback:125] Out: 9563 [Urine:2450]  PHYSICAL EXAMINATION: General:  Awake, alert, NAD lying in bed, morbidly obese, conversational short sentences Neuro:  Unremarkable, non-focal HEENT:  No JVD/LAN Cardiovascular:  HSR RRR  Lungs:  Decreased bs bases, unlabored on O2, 91% sat monitor Abdomen:  Massively obese, old midline scar. Decub position clearly unloads abdomen from diaphragms Musculoskeletal:  intact Skin: warm and dry  LABS:  BMET  Recent Labs Lab 04/16/16 0533 04/16/16 1351 04/17/16 0526  NA 138 140 141  K 5.3* 5.4* 5.0  CL 99* 101 101  CO2 31 31 33*  BUN 36* 34* 34*  CREATININE 2.13* 2.11* 1.88*  GLUCOSE 120* 164* 97    Electrolytes  Recent Labs Lab 04/16/16 0533 04/16/16 1351 04/17/16 0526  CALCIUM 9.0 9.4 8.8*    CBC  Recent Labs Lab 04/15/16 0241 04/16/16 0533 04/17/16 0526   WBC 10.7* 12.5* 10.7*  HGB 7.2* 8.2* 8.0*  HCT 23.2* 25.7* 26.2*  PLT 255 258 272    Coag's No results for input(s): APTT, INR in the last 168 hours.  Sepsis Markers  Recent Labs Lab 04/15/16 0416 04/15/16 0802 04/15/16 0832  LATICACIDVEN 1.14 1.93*  --   PROCALCITON  --   --  0.10    ABG  Recent Labs Lab 04/15/16 0612  PHART 7.305*  PCO2ART 65.6*  PO2ART 61.0*    Liver Enzymes  Recent Labs Lab 04/15/16 0241  AST 10*  ALT 11*  ALKPHOS 113  BILITOT 0.9  ALBUMIN 3.1*    Cardiac Enzymes  Recent Labs Lab 04/15/16 0241  TROPONINI <0.03    Glucose  Recent Labs Lab 04/16/16 1618 04/16/16 2057 04/17/16 0752  GLUCAP 319* 211* 93    Imaging No results found.   STUDIES:    CULTURES: 1/12 bc x 2>>  ANTIBIOTICS: 1/12 cefepime>> 1/12 vanc>>  SIGNIFICANT EVENTS:   LINES/TUBES:   DISCUSSION: [name] former smoker, O2 dependent, limited in ADL's due to SOB who has an extensive PMH that is well documented and presents to [facility] ED with 2 days of increasing SOB, chest burning and NP cough. While in ED she was SOB but responded to fluids and was making urine and able to use BSC with out problem.  Due to her extensive pmh, concern for pan in a immunocompromised patient she will be admitted to SDU and PCCM will monitor.  ASSESSMENT / PLAN:  PULMONARY A: Chronic hypoxic respiratory failure COPD , smoked till august of 2016 despite being treated for lung cancer O2 dependant at 4 l [place] at home [place] left lung tx with Rtx OSA OHS PHTN Unable to tolerate room air supine for VQ scan. Basically won't tolerate supine position due to obesity hypoventilation body habitus P:   O2 as needed goal sat 90-94 BD's as needed No role for steroids   CARDIOVASCULAR A:  CHF CAD(2/16 EF 55%  P:  Careful with hydration   RENAL Lab Results  Component Value Date   CREATININE 1.88 (H) 04/17/2016   CREATININE 2.11 (H) 04/16/2016   CREATININE 2.13 (H)  04/16/2016   CREATININE 1.6 (H) 11/25/2015   CREATININE 1.0 05/28/2015   CREATININE 0.87 10/08/2014   CREATININE 0.80 09/11/2014   CREATININE 0.72 02/10/2014    A:   Acute on CRD P:   Follow creatine  GASTROINTESTINAL A:   GERD P:   PPI  HEMATOLOGIC A:   Hx of adenoma tx with rtx left P:  Followed by hemonc  INFECTIOUS A:   ? pna P:   See ID  ENDOCRINE A:   DM   P:   Follow glucose  NEUROLOGIC A:   Awake and alert jerking movement of rt UE x 2 months- not noted 1/13, 1/14  P:   RASS goal:  Currently awake and alert   FAMILY  - Updates: NO family at bedside. Pt updated     CD Annamaria Boots, MD [facility] Shape PCCM Pager [phone] If no answer page [phone] 04/17/2016, 10:30 AM

## 2016-04-17 NOTE — Progress Notes (Signed)
Subjective: Seen briefly while being taken to ultrasound for LE dopplers.  She was frustrated about not being told about this.  She reports breathing is worse with lying flat and with some anxiety component around having to go for procedure.  She was relieved that her Echo was relatively unchanged.  She feels like her lungs are tight and not moving much air.  Obtains relief when lying on side. Her oxygen saturation has been acceptable 90-94% at around her home oxygen.  At one point last night was measured at 100% on 7 liters.  Objective:  Vital signs in last 24 hours: Vitals:   04/16/16 2000 04/17/16 0014 04/17/16 0500 04/17/16 0754  BP: 109/79 120/66 (!) 100/42 (!) 117/56  Pulse: 82 86 78 86  Resp: _0 Temp: 97.9 F (36.6 C) 98.1 F (36.7 C) 98.2 F (36.8 C) 98.7 F (37.1 C)  TempSrc:    Oral  SpO2: 100% 94% 92% 90%  Weight:   265 lb 9.6 oz (120.5 kg)   Height:       Physical Exam  Constitutional: She is oriented to person, place, and time. No distress.  Obese female, lying on her side, wearing Felt supplemental oxygen, not in distress.  Eyes: Conjunctivae and EOM are normal.  Cardiovascular: Normal rate and regular rhythm.   No murmur heard. Trace pitting edema  Pulmonary/Chest: Effort normal. No respiratory distress.  There is no obvious wheezing on exam but sounds are diminished due to body habitus.  Abdominal: Soft. There is no tenderness.  Protuberant abdomen.  Neurological: She is alert and oriented to person, place, and time.  Skin: She is not diaphoretic.     Assessment/Plan: Pt is a 61 y.o. yo female with a PMHx of COPD on home oxygen, pulmonary hypertension, stage IIa adenocarcinoma of the lung, diastolic dysfunction, s/p drug eluting stent, chronic venous insufficiency, and CKD. who was admitted on 04/15/2016 with symptoms of dyspnea without change in chronic cough, which was determined to be secondary to COPD exacerbation.   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  Breathing appears to be back to near baseline with her oxygen requirement stabilizing around 4-5 liters via nasal canula. She was unable to tolerate lying flat for VQ scan.  D-dimer was elevated.  LE dopplers are negative.  Based on Wells criteria for PE, she is low risk. At this point, we are treating for suspected exacerbation of her chronic obstructive lung disease.  Dyspnea also likely exacerbated due to body habitus. -PCCM is following, appreciate their recommendations -SpO2 goal 90-94%.   -continue azithromycin, prednisone 40 mg  -continue tamiflu  -LE dopplers are negative  -follow up blood cx>> NGTD  -duoneb q6h -dulera BID  -transfer to med-surg  Chronic diastolic heart failure  Repeat Echo with LVEF 40-34%, grade 1 diastolic dysfunction.  Unable to estimate PA pressure but was 64 per Echo in 2014.  Technically difficult study.   She does not appear volume overloaded on exam but weighs 265 lbs up from 245 lbs in November. She contributes this weight gain to increased appetite since she stopped smoking. Her breathing appears stable despite fluids  and holding her Lasix  Anemia of chronic kidney disease  Normal ferritin, low TIBC and iron. Denies blood loss. Hgb 7.2 on admission improved to 8.2 s/p 1 unit transfusion.  -stable hemoglobin this morning  -vitamin B12 supplementation   Hyperkalemia  K 5.3 >> 5.5 >> 5.0 this morning s/p Kayexalte x 1 -daily BMET  Acute on chronic kidney disease  Creatinine 1.8, improved from 2.6 on admission. Renal ultrasound showed no hydronephrosis and grossly unremarkable  kidneys.  -follow up 24 hr creatinine clearance and urine protein -holding lisinopril, metformin -hold lasix for 1 more day.  Plan to resume tomorrow, consider lower dose.  Non insulin dependent Type 2 DM  A1c 8/17 6.5  -ordered CBG and moderate ISS with HS coverage.   Dispo: Anticipated discharge in approximately 1-2 day(s).   Jule Ser, DO 04/17/2016, 9:02 AM Pager: [phone]

## 2016-04-18 DIAGNOSIS — Z6835 Body mass index (BMI) 35.0-35.9, adult: Secondary | ICD-10-CM

## 2016-04-18 DIAGNOSIS — J9622 Acute and chronic respiratory failure with hypercapnia: Secondary | ICD-10-CM

## 2016-04-18 DIAGNOSIS — Z923 Personal history of irradiation: Secondary | ICD-10-CM

## 2016-04-18 DIAGNOSIS — Z85118 Personal history of other malignant neoplasm of bronchus and lung: Secondary | ICD-10-CM

## 2016-04-18 DIAGNOSIS — E662 Morbid (severe) obesity with alveolar hypoventilation: Secondary | ICD-10-CM

## 2016-04-18 DIAGNOSIS — R0609 Other forms of dyspnea: Secondary | ICD-10-CM

## 2016-04-18 DIAGNOSIS — J9621 Acute and chronic respiratory failure with hypoxia: Secondary | ICD-10-CM

## 2016-04-18 DIAGNOSIS — J432 Centrilobular emphysema: Secondary | ICD-10-CM

## 2016-04-18 LAB — BASIC METABOLIC PANEL
Anion gap: 8 (ref 5–15)
BUN: 33 mg/dL — ABNORMAL HIGH (ref 6–20)
CO2: 33 mmol/L — ABNORMAL HIGH (ref 22–32)
Calcium: 9.3 mg/dL (ref 8.9–10.3)
Chloride: 99 mmol/L — ABNORMAL LOW (ref 101–111)
Creatinine, Ser: 1.8 mg/dL — ABNORMAL HIGH (ref 0.44–1.00)
GFR calc Af Amer: 34 mL/min — ABNORMAL LOW (ref >60)
GFR calc non Af Amer: 29 mL/min — ABNORMAL LOW (ref >60)
GLUCOSE: 146 mg/dL — AB (ref 65–99)
Potassium: 4.9 mmol/L (ref 3.5–5.1)
SODIUM: 140 mmol/L (ref 135–145)

## 2016-04-18 LAB — GLUCOSE, CAPILLARY
GLUCOSE-CAPILLARY: 109 mg/dL — AB (ref 65–99)
Glucose-Capillary: 161 mg/dL — ABNORMAL HIGH (ref 65–99)
Glucose-Capillary: 232 mg/dL — ABNORMAL HIGH (ref 65–99)
Glucose-Capillary: 197 mg/dL — ABNORMAL HIGH (ref 65–99)

## 2016-04-18 LAB — CBC
HEMATOCRIT: 26.7 % — AB (ref 36.0–46.0)
HEMOGLOBIN: 8.1 g/dL — AB (ref 12.0–15.0)
MCH: 29.6 pg (ref 26.0–34.0)
MCHC: 30.3 g/dL (ref 30.0–36.0)
MCV: 97.4 fL (ref 78.0–100.0)
Platelets: 240 10*3/uL (ref 150–400)
RBC: 2.74 MIL/uL — AB (ref 3.87–5.11)
RDW: 14.7 % (ref 11.5–15.5)
WBC: 9.5 10*3/uL (ref 4.0–10.5)

## 2016-04-18 MED ORDER — UMECLIDINIUM BROMIDE 62.5 MCG/INH IN AEPB
1.0000 | INHALATION_SPRAY | Freq: Every day | RESPIRATORY_TRACT | Status: DC
  Administered 2016-04-19 – 2016-04-22 (×4): 1 via RESPIRATORY_TRACT
  Filled 2016-04-18 (×2): qty 7

## 2016-04-18 MED ORDER — FUROSEMIDE 40 MG PO TABS
40.0000 mg | ORAL_TABLET | Freq: Every day | ORAL | Status: DC
  Administered 2016-04-18 – 2016-04-20 (×3): 40 mg via ORAL
  Filled 2016-04-18 (×3): qty 1

## 2016-04-18 MED ORDER — ORAL CARE MOUTH RINSE
15.0000 mL | Freq: Two times a day (BID) | OROMUCOSAL | Status: DC
  Administered 2016-04-19 – 2016-04-21 (×3): 15 mL via OROMUCOSAL

## 2016-04-18 MED ORDER — ARFORMOTEROL TARTRATE 15 MCG/2ML IN NEBU
15.0000 ug | INHALATION_SOLUTION | Freq: Two times a day (BID) | RESPIRATORY_TRACT | Status: DC
  Administered 2016-04-18 – 2016-04-22 (×8): 15 ug via RESPIRATORY_TRACT
  Filled 2016-04-18 (×7): qty 2

## 2016-04-18 MED ORDER — ALBUTEROL SULFATE (2.5 MG/3ML) 0.083% IN NEBU
2.5000 mg | INHALATION_SOLUTION | RESPIRATORY_TRACT | Status: DC | PRN
  Administered 2016-04-19: 2.5 mg via RESPIRATORY_TRACT
  Filled 2016-04-18 (×2): qty 3

## 2016-04-18 MED ORDER — BUDESONIDE 0.5 MG/2ML IN SUSP
0.5000 mg | Freq: Two times a day (BID) | RESPIRATORY_TRACT | Status: DC
  Administered 2016-04-18 – 2016-04-22 (×8): 0.5 mg via RESPIRATORY_TRACT
  Filled 2016-04-18 (×7): qty 2

## 2016-04-18 MED ORDER — PREDNISONE 20 MG PO TABS
20.0000 mg | ORAL_TABLET | Freq: Every day | ORAL | Status: AC
  Administered 2016-04-19 – 2016-04-20 (×2): 20 mg via ORAL
  Filled 2016-04-18 (×2): qty 1

## 2016-04-18 NOTE — Progress Notes (Signed)
Subjective: [name] continues to feel short of breath but only while walking to the restroom.  She is at SpO2 91-95% on 4 liters nasal canula.   Objective:  Vital signs in last 24 hours: Vitals:   04/17/16 2344 04/18/16 0500 04/18/16 0754 04/18/16 0908  BP: (!) 132/47 (!) 125/54 110/72   Pulse: 76 78 90   Resp: 18 (!) 21 18   Temp: 98.7 F (37.1 C) 98.3 F (36.8 C) 97.8 F (36.6 C)   TempSrc:   Oral   SpO2: 95% 91% (!) 88% (!) 88%  Weight:  266 lb 9.6 oz (120.9 kg)    Height:       Physical Exam  Constitutional: She appears well-developed and well-nourished. No distress.  Eyes: Conjunctivae are normal. No scleral icterus.  Cardiovascular:  Trace pitting edema  Pulmonary/Chest: Effort normal and breath sounds normal. No respiratory distress.  Decreased breath sounds   Abdominal: Soft. Bowel sounds are normal. She exhibits no distension. There is no tenderness.  Skin: She is not diaphoretic.    Assessment/Plan: Pt is a 61 y.o. yo female with a PMHx of COPD on home oxygen, pulmonary hypertension, stage IIa adenocarcinoma of the lung, diastolic dysfunction, s/p drug eluting stent, chronic venous insufficiency, and CKD. who was admitted on 04/15/2016 with symptoms of dyspnea without change in chronic cough, which was determined to be secondary to COPD exacerbation.   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 Influenza swabs were negative, however we are treating empirically. Pro calcitonin 0.1 >> <0.1. Dopplers negative for DVT, D-dimer elevated but not specific for PE given her wells score for PE =0 and  -PCCM is  following, appreciate their recommendations -continue duoneb q6h, azithromycin, prednisone 40 mg, and tamiflu  -follow up blood cx>> NGTD  -nasal canula oxygen to keep SpO2 90-94%   -dulera BID  -telemetry monitoring   Chronic diastolic heart failure  ECHO 04/2016 EF 50-55% and G1DD. Abdominal distention but appears euvolemic on exam.  Weight stable.  -restarted lasix  40 mg qd (half of home dose 80 mg qd)   - follow up echo  -daily weights, strict I&Os   Anemia of chronic kidney disease  Normal ferritin, low TIBC and iron. Denies blood loss. Hgb 7.2, stable in the 8s s/p transfusion.  -vitamin B12 supplementation   Obstructive sleep apnea  No sleep study results in our system, has refused having one in the past because she is claustrophobic and says that she would not want to use a CPAP mask. She would benefit from treatment for OSA.  -trial of CPAP   Hyperkalemia  K 5.3 this morning  -follow up afternoon BMP   Acute on chronic kidney disease  Crt 1.8, improving from 2.6 on admission. Has been steadily rising from baseline 0.8 in 05/2015. Renal ultrasound showed no hydronephrosis, decreased urine creatinine. There is likely a component of CKD at this point.  - holding lisinopril, metformin, and lasix  Non insulin dependent Type 2 DM  A1c 8/17 6.5. CBG 100-190 on moderate ISS  -continue CBG and moderate ISS with HS coverage.   GERD  -continue home med pantoprazole  40 mg qd.   Constipation  -miralax  -senna   Dispo: Anticipated discharge in approximately 1-2 day(s).   Ledell Noss, MD 04/18/2016, 11:33 AM Pager: [phone]

## 2016-04-18 NOTE — Progress Notes (Signed)
Internal Medicine Attending:   I saw and examined the patient. I reviewed the resident's note and I agree with the resident's findings and plan as documented in the resident's note.  61 year old woman now hospital day # 3 for acute on chronic hypoxic respiratory failure which is multifactorial in nature. Patient has advanced COPD due to tobacco use, obesity hypoventilation syndrome and OSA for which she has been unable to tolerate CPAP initiation, lung cancer that had curative radiotherapy, and a recurrent left-sided pleural effusion since at least November. She was very hypoxic and hypotensive on admission and we have managed her with prednisone and azithromycin for a COPD exacerbation. She's responded well to treatment, oxygen requirement is now 4 L nasal cannula at rest which is her baseline. However with exertion she still becomes very hypoxic down to 80% on 4 L. We attempted to rule out chronic pulmonary embolic disease, however she was unable to tolerate a VQ scan because she cannot lie flat. Lower extremities are clear of clot on Doppler ultrasound. Last thoracentesis was 11/4 with VIR and yielded 500 ml of blood tinged transudate. I am not confident that another thoracentesis would improve her oxygenation, I don't see much associated atelectasis on the chest xray. Plan to continue supportive care, wean oxygen as able, PT and OT, and I talked with her about a SNF stay after this hospitalization because I doubt she will be able to care for herself at home with such limited exertional capacity.

## 2016-04-18 NOTE — Evaluation (Signed)
Physical Therapy Evaluation Patient Details Name: [name] MRN: [id number] DOB: [birth year] Today's Date: 04/18/2016   History of Present Illness  Patient is a 61 y/o female with hx of stage 2 NSCLC, pulmonary HTN, CAD, CHF, COPD, prediabetes, HLD presents with SOB. 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   Clinical Impression  Patient presents with dyspnea on exertion, impaired activity tolerance and decreased cardiovascular endurance s/p above. Tolerated short distance ambulation of 10' prior to needing to sit secondary to dyspnea and drop in Sp02 to 78% on 4L/min 02. Takes 1-2 minutes to recover. At this time, pt is not able to walk the distance to get to her bathroom at home. Education re: how to [employer], rollator safety, energy conservation techniques etc. Pt is not safe to return home at this time. Pt was able to perform ADLs/IADls independently PTA and not does not have the endurance or stamina to walk 10'. Would benefit from SNF to maximize independence and mobility prior to return home. Will follow.   Follow Up Recommendations SNF    Equipment Recommendations  Other (comment) (4 wheeled walker (rollator))    Recommendations for Other Services       Precautions / Restrictions Precautions Precautions: Fall Precaution Comments: watch 02 Restrictions Weight Bearing Restrictions: No      Mobility  Bed Mobility Overal bed mobility: Needs Assistance Bed Mobility: Supine to Sit     Supine to sit: Modified independent (Device/Increase time);HOB elevated     General bed mobility comments: No assist needed. No dizziness.  Transfers Overall transfer level: Needs  assistance Equipment used: 4-wheeled walker Transfers: Sit to/from Stand Sit to Stand: Min guard         General transfer comment: Min guard for safety. Stood from [employer], from rollator seat x1. SPT bed to/from [facility] with min guard assist. Sp02 dropped to high 70s on 4L/min 02.  Ambulation/Gait Ambulation/Gait assistance: Min guard Ambulation Distance (Feet): 10 Feet (x2 bouts) Assistive device: 4-wheeled walker Gait Pattern/deviations: Step-through pattern;Decreased stride length;Trunk flexed Gait velocity: decreased Gait velocity interpretation: Below normal speed for age/gender General Gait Details: Slow, steady gait using rollator for support but needed to sit after 10' due to 3/4 DOE and Sp02 dropped to 78% on 4L/min 02. Took ~1-43mns to recover. Cues for pursed lip breathing.  Stairs            Wheelchair Mobility    Modified Rankin (Stroke Patients Only)       Balance Overall balance assessment: Needs assistance Sitting-balance support: Feet supported;No upper extremity supported Sitting balance-Leahy Scale: Good Sitting balance - Comments: Independent with pericare   Standing balance support: During functional activity Standing balance-Leahy Scale: Fair                               Pertinent Vitals/Pain Pain Assessment: No/denies pain    Home Living Family/patient expects to be discharged to:: Private residence Living Arrangements: Other relatives ((7y/o grandson) Available Help at Discharge: Family;Available 24 hours/day Type of Home: House Home Access: Level entry     Home Layout: One level Home Equipment: Walker - 2 wheels;Shower seat;Hand held shower head  Prior Function Level of Independence: Independent with assistive device(s)         Comments: Independent for household ambulation, uses RW for community ambulation. Able to perform ADls with increased time due to SOB. Sits on shower seat for bathing. DOes not leave house  much due to [place].     Hand Dominance   Dominant Hand: Right    Extremity/Trunk Assessment   Upper Extremity Assessment Upper Extremity Assessment: Defer to OT evaluation    Lower Extremity Assessment Lower Extremity Assessment: Overall WFL for tasks assessed       Communication   Communication: No difficulties  Cognition Arousal/Alertness: Awake/alert Behavior During Therapy: WFL for tasks assessed/performed Overall Cognitive Status: Within Functional Limits for tasks assessed                      General Comments      Exercises     Assessment/Plan    PT Assessment Patient needs continued PT services  PT Problem List Decreased mobility;Obesity;Decreased activity tolerance;Cardiopulmonary status limiting activity;Decreased knowledge of use of DME          PT Treatment Interventions DME instruction;Therapeutic activities;Gait training;Therapeutic exercise;Patient/family education;Balance training;Functional mobility training    PT Goals (Current goals can be found in the Care Plan section)  Acute Rehab PT Goals Patient Stated Goal: to be able to walk to the bathroom  PT Goal Formulation: With patient Time For Goal Achievement: 05/02/16 Potential to Achieve Goals: Good    Frequency Min 3X/week   Barriers to discharge        Co-evaluation               End of Session Equipment Utilized During Treatment: Oxygen;Gait belt Activity Tolerance: Treatment limited secondary to medical complications (Comment) (drop in Sp02 with mobility.) Patient left: in bed;with call bell/phone within reach (sitting EOB.) Nurse Communication: Mobility status         Time: 6415-8309 PT Time Calculation (min) (ACUTE ONLY): 29 min   Charges:   PT Evaluation $PT Eval Moderate Complexity: 1 Procedure PT Treatments $Gait Training: 8-22 mins   PT G Codes:        Nalia Honeycutt A Ting Cage 04/18/2016, 3:42 PM Wray Kearns, [name], DPT [phone]

## 2016-04-18 NOTE — Discharge Summary (Signed)
Name: [name] MRN: [id number] DOB: [birth year] 61 y.o. PCP: Oval Linsey, MD  Date of Admission: 04/15/2016  2:11 AM Date of Discharge: 04/21/2016 Attending Physician: Lucious Groves, DO  Discharge Diagnosis: 1.    Acute respiratory failure (HCC)   Severe chronic obstructive pulmonary disease ([place])  Discharge Medications: Allergies as of 04/21/2016      Reactions   Fluconazole Anaphylaxis, Itching, Swelling   Atorvastatin Other (See Comments)   Dizziness after med started and resolved per pt after med stopped. September 08 2014      Medication List    STOP taking these medications   Fluticasone-Salmeterol 250-50 MCG/DOSE Aepb Commonly known as:  ADVAIR DISKUS   levofloxacin 500 MG tablet Commonly known as:  LEVAQUIN   predniSONE 20 MG tablet Commonly known as:  DELTASONE   tiotropium 18 MCG inhalation capsule Commonly known as:  SPIRIVA HANDIHALER     TAKE these medications   ACCU-CHEK AVIVA device Use 2-3 times daily, ICD E11.51, non-insulin requiring   accu-chek soft touch lancets Use 2 to 3 times daily to check blood sugar. diag code E11.51. Non insulin dependent.   albuterol (2.5 MG/3ML) 0.083% nebulizer solution Commonly known as:  PROVENTIL Take 3 mLs (2.5 mg total) by nebulization every 6 (six) hours as needed for shortness of breath.   albuterol 108 (90 Base) MCG/ACT inhaler Commonly known as:  VENTOLIN HFA Inhale 1-2 puffs into the lungs every 6 (six) hours as needed for shortness of breath.   arformoterol 15 MCG/2ML Nebu Commonly known as:  BROVANA Take 2 mLs (15 mcg total) by nebulization 2 (two) times daily.   aspirin 81 MG chewable tablet Chew 1 tablet (81 mg total) by mouth at bedtime.   budesonide 0.5 MG/2ML nebulizer solution Commonly known as:  PULMICORT Take 2 mLs (0.5 mg total) by nebulization 2 (two) times daily.   buPROPion 150 MG 12 hr tablet Commonly known as:  WELLBUTRIN SR Take 1 tablet (150 mg total) by mouth 2 (two) times  daily.   furosemide 40 MG tablet Commonly known as:  LASIX Take 80 mg by mouth daily.   gabapentin 300 MG capsule Commonly known as:  NEURONTIN Take 1 capsule (300 mg total) by mouth 3 (three) times daily. What changed:  how much to take  when to take this  additional instructions   glucose blood test strip Commonly known as:  ACCU-CHEK AVIVA PLUS Use 2 to 3 times daily to check blood sugar. diag code E11.51. Non insulin dependent   lisinopril 5 MG tablet Commonly known as:  PRINIVIL,ZESTRIL Take 1 tablet (5 mg total) by mouth at bedtime.   metFORMIN 500 MG tablet Commonly known as:  GLUCOPHAGE Take 1 tablet (500 mg total) by mouth 2 (two) times daily with a meal.   metolazone 2.5 MG tablet Commonly known as:  ZAROXOLYN TAKE 1 TABLET BY MOUTH AS NEEDED(FOR 5 LBS WEIGHT GAIN)   OXYGEN Inhale 4.5 L into the lungs continuous.   pantoprazole 40 MG tablet Commonly known as:  PROTONIX Take 1 tablet (40 mg total) by mouth daily.   potassium chloride SA 20 MEQ tablet Commonly known as:  K-DUR,KLOR-CON Take 1 tablet (20 mEq total) by mouth daily.   rosuvastatin 20 MG tablet Commonly known as:  CRESTOR Take 1 tablet (20 mg total) by mouth daily.   umeclidinium bromide 62.5 MCG/INH Aepb Commonly known as:  INCRUSE ELLIPTA Inhale 1 puff into the lungs daily.   vitamin B-12 1000 MCG tablet  Commonly known as:  CYANOCOBALAMIN Take 1 tablet (1,000 mcg total) by mouth daily.   Vitamin D (Ergocalciferol) 50000 units Caps capsule Commonly known as:  DRISDOL Take 1 capsule (50,000 Units total) by mouth every 7 (seven) days.       Disposition and follow-up:   [name] was discharged from [facility] in Stable condition.  At the hospital follow up visit please address:  1. Severe COPD - was she able to pick up and afford the new medications that were started this hospitalization?   Left pleural effusion- PCCM drained the effusion and recommended  repeat chest xrayafter discharge to evaluate for the utility of CVTS follow up for placement of a pleurex cath.   Lisinopril held at d/c for AKI, BP was well controlled without it during admission. How is her crt at follow up and can lisinopril be restarted?   2.  Labs / imaging needed at time of follow-up: BMP (crt 1.6 the day of discharge)   3.  Pending labs/ test needing follow-up: Repeat chest xray 1 week after discharge  Follow-up Appointments: Follow-up Information    [place]. Schedule an appointment as soon as possible for a visit in 1 week(s).   Contact information: 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 At presentation she was found to be afebrile, hypotensive, HR 90s, SpO2 90% on 4 L which is her home dose of oxygen. She had increased work of breathing and decreased breath sounds. Chest xray showed vascular congestion and slightly increased size of a pleural effusion which had been drained during prior hospitalization. She was admitted for suspected COPD exacerbation and pulmonology was consulted. She was started on prednisone and azithromycin and completed a 5 day course of each. We attempted to rule out pulmonary embolic disease but she was unable to tolerate a VQ scan because she couldn't lie flat. Lower extremity dopplers did not reveal lower extremity DVT. Pulmonology started brovana nebs, pulmicort nebs, and incruse inhaler. She  responded well to treatment but continued to feel short of breath and became very hypoxic down to 80% on 4 liters when ambulating. PT recommended SNF stay. We had a conversation about advanced directives and completed a MOST form.     Recurrent left pleural effusion Chest xray showed a persistent left pleural effusion from hospitalization 02/2016 when 500 cc of transudative fluid was taken off.  Pulmonology performed thoracentesis and 600 cc of cloudy yellow transudative fluid was taken off of her lung. Cytology negative for malignant cells. She felt some relief of her difficulty breathing after the procedure. Chest xray after the procedure showed persistence of the effusion and persistent pulmonary edema. Two doses of IV lasix 40 mg led to good urine output and relief of her dyspnea but her Crt increased 1.6 to 2.0 and her home dose of oral lasix was resumed.   Normocytic anemia  Initial hgb 7.2 with baseline 9-10 improved to 9 after 1 unit blood  transfusion. Iron panel showed normal ferritin with low iron and TIBC suggesting anemia of chronic disease likely related to her chronic kidney dysfunction.      Acute kidney injury ([place]) Crt 2.6 at presentation with baseline 1.3, improved and stabilized to 1.6 after IV fluids then increased to 2.0 after she was started on IV lasix.   Grade 1 diastolic dysfunction  On admission she weighed 266 lbs up from 240 lbs lbs on last check 02/2016. She attributed this weight gain to increased eating since she had stopped smoking. Home dose of lasix was initially held in the setting of AKI then tapered from 20 mg to 40 mg. After 2 L bolus and 1 unit of blood in the ED her difficulty breathing did not worsen. She had trace pitting edema but otherwise appeared euvolemic for the duration of this hospitalization.    Obesity, Class II, BMI 35-39.9 Obstructive sleep apnea Obesity hyperventilation syndrome  She has refused sleep study in the past because she felt that  claustrophobia would limit her ability to wear a BIPAP mask. Tried a trial of CPAP this hospitalization but she did not feel any improvement in her sleep, breathing, or energy with using the machine.    Discharge Vitals:   BP 110/61 (BP Location: Right Arm)   Pulse 71   Temp 98.2 F (36.8 C) (Oral)   Resp 19   Ht _0  (1.626 m)   Wt 257 lb 14.4 oz (117 kg)   SpO2 96%   BMI 44.27 kg/m   Pertinent Labs, Studies, and Procedures:  Procedures Performed:  Dg Chest 2 View  Result Date: 04/21/2016 CLINICAL DATA:  Dyspnea, history lung cancer, CHF, coronary artery disease, pulmonary hypertension, COPD EXAM: CHEST  2 VIEW COMPARISON:  04/19/2016 FINDINGS: Enlargement of cardiac silhouette with pulmonary vascular congestion. Enlarged central pulmonary arteries. LEFT pleural effusion and accompanying LEFT basilar atelectasis. Diffuse pulmonary edema question minimally improved. No pneumothorax. Bones diffusely demineralized. Atherosclerotic calcification at aortic arch. IMPRESSION: CHF with persistent pulmonary edema and LEFT pleural effusion. Aortic atherosclerosis. Electronically Signed   By: Lavonia Dana M.D.   On: 04/21/2016 10:07   Dg Chest 2 View  Result Date: 04/19/2016 CLINICAL DATA:  Pleural effusion EXAM: CHEST  2 VIEW COMPARISON:  04/15/2006 FINDINGS: Stable enlarged cardiac silhouette. Moderate LEFT effusion is not changed. Small RIGHT effusion unchanged. Central venous congestion unchanged. Degenerative osteophytosis of the spine. IMPRESSION: No change in moderate LEFT effusion. Electronically Signed   By: Suzy Bouchard M.D.   On: 04/19/2016 08:57   Dg Chest 2 View  Result Date: 04/15/2016 CLINICAL DATA:  Shortness of breath with exertion. Chest pain. Increasing home oxygen over the past few days. Wheezing. EXAM: CHEST  2 VIEW COMPARISON:  02/06/2016 FINDINGS: Cardiac enlargement with pulmonary vascular congestion. Increasing perihilar infiltrates since prior study suggest edema. Left  pleural effusion with basilar atelectasis. No pneumothorax. Degenerative changes in the spine. Surgical clips in the left upper quadrant. IMPRESSION: Cardiac enlargement with pulmonary vascular congestion and increasing perihilar infiltrates, likely edema. Left pleural effusion is increasing. Atelectasis in the left lung base. Electronically Signed   By: Lucienne Capers M.D.   On: 04/15/2016 03:00   US Renal  Result Date: 04/16/2016 CLINICAL DATA:  Chronic renal insufficiency. Acute onset of shortness of breath. Initial encounter. EXAM: RENAL / URINARY TRACT ULTRASOUND COMPLETE COMPARISON:  PET/CT performed 11/12/2014 FINDINGS: Right Kidney: Length: 11.3 cm. Echogenicity within normal limits. No mass or hydronephrosis visualized. Left Kidney: Length: 11.3 cm. Echogenicity  within normal limits. No mass or hydronephrosis visualized. Bladder: Appears normal for degree of bladder distention. A large left-sided pleural effusion is noted. IMPRESSION: 1. No evidence of hydronephrosis. Kidneys grossly unremarkable in appearance. 2. Large left-sided pleural effusion noted. Electronically Signed   By: Garald Balding M.D.   On: 04/16/2016 03:48   Dg Chest Port 1 View  Result Date: 04/19/2016 CLINICAL DATA:  Thoracentesis. EXAM: PORTABLE CHEST 1 VIEW COMPARISON:  04/19/2016.  02/06/2016. FINDINGS: Cardiomegaly with bilateral diffuse pulmonary interstitial prominence and bilateral pleural effusions, left side greater right. Findings consistent with congestive heart failure. Findings have progressed slightly from prior exam. IMPRESSION: Congestive heart failure with bilateral pulmonary edema with bilateral pleural effusions, left side greater right. Findings have progressed slightly from prior exam. Electronically Signed   By: [place]   On: 04/19/2016 12:17   Dg Abd Portable 2v  Result Date: 04/15/2016 CLINICAL DATA:  Constipation, shortness of breath EXAM: PORTABLE ABDOMEN - 2 VIEW COMPARISON:  None.  FINDINGS: There is a large amount of stool in the ascending colon. There is no bowel dilatation to suggest obstruction. There is no evidence of pneumoperitoneum, portal venous gas or pneumatosis. There are no pathologic calcifications along the expected course of the ureters. The osseous structures are unremarkable. IMPRESSION: 1. Large amount of stool in the ascending colon. Electronically Signed   By: Kathreen Devoid   On: 04/15/2016 11:20   2D Echo: EF 44-69%, grade 1 diastolic dysfunction  Consultations: Pulmonary critical care medicine    Discharge Instructions: Discharge Instructions    Call MD for:  difficulty breathing, headache or visual disturbances    Complete by:  As directed    Call MD for:  extreme fatigue    Complete by:  As directed    Call MD for:  temperature >100.4    Complete by:  As directed    Diet - low sodium heart healthy    Complete by:  As directed    Increase activity slowly    Complete by:  As directed       Signed: Ledell Noss, MD 04/21/2016, 12:51 PM   Pager: [phone]

## 2016-04-18 NOTE — Progress Notes (Signed)
Offered Pt a bath, Pt refused

## 2016-04-18 NOTE — Progress Notes (Signed)
PULMONARY / CRITICAL CARE MEDICINE   Name: [name] MRN: [id number] DOB: 03/09/1956    ADMISSION DATE:  04/15/2016 CONSULTATION DATE:  1/12  REFERRING MD: IMTS  CHIEF COMPLAINT: SOB  Brief Hx: 61yo female former smoker, O2 dependent, limited in ADL's due to SOB who has an extensive PMH that is well documented and presented to [facility] ED 1/12 with 2 days of increasing SOB, chest burning and NP cough. Whiled in ED she was SOB but responded to fluids and was making urine and able to use BSC with out problem. Due to her extensive pmh, concern for PNA in a immunocompromised patient she will be admitted to SDU and PCCM will monitor.  SUBJECTIVE:  Feels "miserable".  Denies SOB at rest but c/o significant DOE with any activity.   VITAL SIGNS: BP 110/72   Pulse 90   Temp 97.8 F (36.6 C) (Oral)   Resp 18   Ht _0  (1.626 m)   Wt 120.9 kg (266 lb 9.6 oz)   SpO2 (!) 88%   BMI 45.76 kg/m   INTAKE / OUTPUT: I/O last 3 completed shifts: In: 1464 [P.O.:1464] Out: 2850 [Urine:2850]  PHYSICAL EXAMINATION: General:  Chronically ill appearing female, NAD sitting on side of bed  Neuro:  Unremarkable, non-focal HEENT:  No JVD/LAN Cardiovascular:  HSR RRR  Lungs:  resps even non labored on 4L [place], diminished throughout, no audible wheeze  Abdomen:  Obese, soft Musculoskeletal:  intact Skin: warm and dry  LABS:  BMET  Recent Labs Lab 04/16/16 1351 04/17/16 0526 04/18/16 0258  NA 140 141 140  K 5.4* 5.0 4.9  CL 101 101 99*  CO2 31 33* 33*  BUN 34* 34* 33*  CREATININE 2.11* 1.88* 1.80*  GLUCOSE 164* 97 146*    Electrolytes  Recent Labs Lab 04/16/16 1351 04/17/16 0526 04/18/16 0258  CALCIUM 9.4 8.8* 9.3    CBC  Recent Labs Lab 04/16/16 0533 04/17/16 0526 04/18/16 0258  WBC 12.5* 10.7* 9.5  HGB 8.2* 8.0* 8.1*  HCT 25.7* 26.2* 26.7*  PLT 258 272 240    Coag's No results for input(s): APTT, INR in the last 168 hours.  Sepsis Markers  Recent Labs Lab  04/15/16 0416 04/15/16 0802 04/15/16 0832 04/17/16 0526  LATICACIDVEN 1.14 1.93*  --   --   PROCALCITON  --   --  0.10 <0.10    ABG  Recent Labs Lab 04/15/16 0612  PHART 7.305*  PCO2ART 65.6*  PO2ART 61.0*    Liver Enzymes  Recent Labs Lab 04/15/16 0241  AST 10*  ALT 11*  ALKPHOS 113  BILITOT 0.9  ALBUMIN 3.1*    Cardiac Enzymes  Recent Labs Lab 04/15/16 0241  TROPONINI <0.03    Glucose  Recent Labs Lab 04/16/16 2057 04/17/16 0752 04/17/16 1118 04/17/16 1700 04/17/16 2131 04/18/16 0753  GLUCAP 211* 93 180* 300* 197* 109*    Imaging No results found.   STUDIES:    CULTURES: 1/12 bc x 2>> 1/12 RVP>>> NEG   ANTIBIOTICS: 1/12 cefepime>>1/14 1/12 vanc>>1/14 1/14 azithro>>>  1/14 tamiflu>>>    DISCUSSION: MO former smoker, O2 dependent, limited in ADL's due to SOB who has an extensive PMH that is well documented and presents to [facility] ED with 2 days of increasing SOB, chest burning and NP cough. While in ED she was SOB but responded to fluids and was making urine and able to use BSC with out problem. Due to her extensive pmh, concern for pan in a  immunocompromised patient she will be admitted to SDU and PCCM will monitor.  ASSESSMENT / PLAN:   Chronic hypoxic respiratory failure - O2 dependent at baseline (4L) COPD [place] ca left lung tx with Rtx OSA OHS Pulmonary HTN P:   O2 as needed goal sat 90-94 - tolerating home 4L BD's as needed Pulmonary hygiene  No role for steroids - will wean to 75m/day then d/c   Unable to tolerate supine for VQ scan r/t body habitus Some anxiety component to dyspnea as well  d/c tamiflu - RVP and flu PCR NEG     KNickolas Madrid NP 04/18/2016  11:57 AM Pager: [phone] or [phone]

## 2016-04-18 NOTE — Progress Notes (Signed)
Patient Saturations on 4 Liters of oxygen while Ambulating = 79 % Pt was not able to get out of the room because of severe dyspnea while ambulating to the bathroom.

## 2016-04-19 ENCOUNTER — Inpatient Hospital Stay (HOSPITAL_COMMUNITY): Payer: Medicare HMO

## 2016-04-19 DIAGNOSIS — J90 Pleural effusion, not elsewhere classified: Secondary | ICD-10-CM

## 2016-04-19 DIAGNOSIS — Z9889 Other specified postprocedural states: Secondary | ICD-10-CM

## 2016-04-19 LAB — BASIC METABOLIC PANEL
Anion gap: 7 (ref 5–15)
BUN: 32 mg/dL — AB (ref 6–20)
CO2: 34 mmol/L — AB (ref 22–32)
Calcium: 9.1 mg/dL (ref 8.9–10.3)
Chloride: 97 mmol/L — ABNORMAL LOW (ref 101–111)
Creatinine, Ser: 1.61 mg/dL — ABNORMAL HIGH (ref 0.44–1.00)
GFR calc Af Amer: 39 mL/min — ABNORMAL LOW (ref >60)
GFR calc non Af Amer: 33 mL/min — ABNORMAL LOW (ref >60)
GLUCOSE: 155 mg/dL — AB (ref 65–99)
POTASSIUM: 4.4 mmol/L (ref 3.5–5.1)
Sodium: 138 mmol/L (ref 135–145)

## 2016-04-19 LAB — UIFE/LIGHT CHAINS/TP QN, 24-HR UR
% BETA, URINE: 31.5 %
ALPHA 1 URINE: 2 %
Albumin, U: 9.4 %
Alpha 2, Urine: 31.2 %
FREE KAPPA/LAMBDA RATIO: 3.19 (ref 2.04–10.37)
FREE LAMBDA LT CHAINS, UR: 126 mg/L — AB (ref 0.24–6.66)
FREE LT CHN EXCR RATE: 402 mg/L — AB (ref 1.35–24.19)
GAMMA GLOBULIN URINE: 25.9 %
PDF-U24IEL: 0
TIME-UPE24: 24 h
TOTAL PROTEIN, URINE-UR/DAY: 575 mg/(24.h) — AB (ref 30–150)
Total Protein, Urine: 41.1 mg/dL

## 2016-04-19 LAB — PROTEIN, BODY FLUID: Total protein, fluid: 3.5 g/dL

## 2016-04-19 LAB — GLUCOSE, CAPILLARY
GLUCOSE-CAPILLARY: 145 mg/dL — AB (ref 65–99)
GLUCOSE-CAPILLARY: 163 mg/dL — AB (ref 65–99)
Glucose-Capillary: 88 mg/dL (ref 65–99)
Glucose-Capillary: 234 mg/dL — ABNORMAL HIGH (ref 65–99)

## 2016-04-19 LAB — LACTATE DEHYDROGENASE, PLEURAL OR PERITONEAL FLUID: LD, Fluid: 89 U/L — ABNORMAL HIGH (ref 3–23)

## 2016-04-19 LAB — BODY FLUID CELL COUNT WITH DIFFERENTIAL
Eos, Fluid: NONE SEEN %
LYMPHS FL: 66 %
MONOCYTE-MACROPHAGE-SEROUS FLUID: 27 % — AB (ref 50–90)
NEUTROPHIL FLUID: 7 % (ref 0–25)
Total Nucleated Cell Count, Fluid: 180 cu mm (ref 0–1000)

## 2016-04-19 LAB — PROTEIN, TOTAL: Total Protein: 6.2 g/dL — ABNORMAL LOW (ref 6.5–8.1)

## 2016-04-19 LAB — LACTATE DEHYDROGENASE: LDH: 167 U/L (ref 98–192)

## 2016-04-19 LAB — GLUCOSE, SEROUS FLUID: GLUCOSE FL: 140 mg/dL

## 2016-04-19 LAB — PROCALCITONIN: Procalcitonin: <0.1 ng/mL

## 2016-04-19 MED ORDER — GABAPENTIN 300 MG PO CAPS
300.0000 mg | ORAL_CAPSULE | Freq: Every evening | ORAL | Status: DC
  Administered 2016-04-19 – 2016-04-21 (×3): 300 mg via ORAL
  Filled 2016-04-19 (×3): qty 1

## 2016-04-19 MED ORDER — INSULIN ASPART 100 UNIT/ML ~~LOC~~ SOLN
0.0000 [IU] | Freq: Every day | SUBCUTANEOUS | Status: DC
Start: 2016-04-19 — End: 2016-04-22
  Administered 2016-04-21: 2 [IU] via SUBCUTANEOUS

## 2016-04-19 MED ORDER — LIDOCAINE HCL 1 % IJ SOLN
10.0000 mL | Freq: Once | INTRAMUSCULAR | Status: DC
  Filled 2016-04-19: qty 10

## 2016-04-19 MED ORDER — INSULIN ASPART 100 UNIT/ML ~~LOC~~ SOLN
0.0000 [IU] | Freq: Three times a day (TID) | SUBCUTANEOUS | Status: DC
  Administered 2016-04-19: 3 [IU] via SUBCUTANEOUS
  Administered 2016-04-19: 7 [IU] via SUBCUTANEOUS
  Administered 2016-04-20: 4 [IU] via SUBCUTANEOUS
  Administered 2016-04-20: 7 [IU] via SUBCUTANEOUS
  Administered 2016-04-22 (×2): 4 [IU] via SUBCUTANEOUS

## 2016-04-19 MED ORDER — GABAPENTIN 300 MG PO CAPS: 300.0000 mg | ORAL_CAPSULE | Freq: Every evening | ORAL | Status: DC

## 2016-04-19 NOTE — Progress Notes (Addendum)
Internal Medicine Attending:   I saw and examined the patient. I reviewed the resident's note and I agree with the resident's findings and plan as documented in the resident's note. [name] notes that overall she is doing slightly better than when she arrived but not back to her baseline.  She does note that she did improve after the last thoracentesis and is willing to undergo repeat thoracentesis with Pulmonology today.  At the time I am writiing this note is appears Dr Halford Chessman has done this yielding 600cc of amber fluid that has been sent for diagnostic evaluation.  We will also send for serum protein, LDH. Otherwise we will continue to try to maximize her chronic respiratory failure.  We will prepare for SNF discharge in the next few days, I think she would benefit from some further [place] discussions and we will complete a MOST form prior to her discharge. Also to clarify her admitting diagnosis: initially there was a concern for sepsis as a cause of her acute on chronic respiratory failure however this does not appear to be the case, rather her respiratory failure is multifactorial due to her severe COPD, probable OSA/OHS, and recurrent left pleural effusion.

## 2016-04-19 NOTE — Progress Notes (Signed)
Subjective: [name] continues to feel short of breath today. She feels as though her dyspnea is worsening from the time of admission but that her energy has improved. She walked with physical therapy today and had SpO2 78% on 4 L [place] with walking and had to rest for 1-2 minutes and recover after walking about 10 feet.     Objective:  Vital signs in last 24 hours: Vitals:   04/19/16 0142 04/19/16 0827 04/19/16 0840 04/19/16 0841  BP: 126/65 (!) 134/59    Pulse: 64     Resp: 15     Temp: 97.9 F (36.6 C) 98.6 F (37 C)    TempSrc:  Oral    SpO2: 95% 95% 94% 95%  Weight: 264 lb 1.6 oz (119.8 kg)     Height:       Physical Exam  Constitutional: She appears well-developed and well-nourished. No distress.  Eyes: Conjunctivae are normal. No scleral icterus.  Cardiovascular:  Trace pitting edema  Pulmonary/Chest: Effort normal and breath sounds normal. No respiratory distress.  Decreased breath sounds   Abdominal: Soft. Bowel sounds are normal. She exhibits no distension. There is no tenderness.  Skin: She is not diaphoretic.    Assessment/Plan: Pt is a 61 y.o. yo female with a PMHx of COPD on home oxygen, pulmonary hypertension, stage IIa adenocarcinoma of the lung, diastolic dysfunction, s/p drug eluting stent, chronic venous insufficiency, and CKD. who was admitted on 04/15/2016 with symptoms of dyspnea without change in chronic cough, which was determined to be secondary to COPD exacerbation.   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 Hx of recurrent left pleural effusion, 500 cc were taken off of the effusion at previous admission and alleviated some of her dyspnea. PCCM plans for thoracentesis.  -PCCM is following, appreciate their recommendations -continue duoneb q6h, azithromycin, prednisone 20 mg -started brovana, pulmicort, and incruse  -discontinued dulera  -follow up blood cx>> NGTD  -nasal canula oxygen to keep SpO2 90-94%    -telemetry monitoring   Chronic diastolic heart failure  ECHO 04/2016 EF 50-55% and G1DD. Abdominal distention, some pulmonary crackles and some pitting edema.  Weight stable.  -continue lasix 40 mg qd (half of home dose 80 mg qd)    -daily weights, strict I&Os   Anemia of chronic kidney disease  Normal ferritin, low TIBC and iron. Denies blood loss. Hgb 7.2, stable in the 8s s/p transfusion.  -vitamin B12 supplementation   Obstructive sleep apnea  She tolerated CPAP for part of the night but had some discomfort and stopped using the mask halfway through the night. Reports she feels no better this morning after using CPAP compared to not using it.  -trial of CPAP   Hyperkalemia  Resolved   Acute on chronic kidney disease  Crt 1.6, improving from 2.6 on admission. Has been steadily rising from baseline 0.8 in 05/2015.   - holding lisinopril, metformin  -resuming lasix at half of home dose   Non insulin dependent Type 2  DM  A1c 8/17 6.5. CBG 150-200 on moderate ISS. She is on prednisone for COPD exacerbation.  -continue CBG monitoring  -increased moderate to resistant ISS with HS coverage.   GERD  -continue home med pantoprazole 40 mg qd.   Constipation  -miralax  -senna   Dispo: Anticipated discharge in approximately 1-2 day(s).   Ledell Noss, MD 04/19/2016, 8:46 AM Pager: [phone]

## 2016-04-19 NOTE — Care Management Important Message (Signed)
Important Message  Patient Details  Name: [name] MRN: [id number] Date of Birth: [birth year]   Medicare Important Message Given:  Yes    Nathen May 04/19/2016, 12:37 PM

## 2016-04-19 NOTE — Clinical Social Work Note (Signed)
Clinical Social Work Assessment  Patient Details  Name: [name] MRN: [id number] Date of Birth: 1955/11/17  Date of referral:  04/19/16               Reason for consult:  Facility Placement                Permission sought to share information with:  Facility Art therapist granted to share information::  Yes, Verbal Permission Granted  Name::        Agency::  SNFs  Relationship::     Contact Information:     Housing/Transportation Living arrangements for the past 2 months:  Apartment Source of Information:  Patient Patient Interpreter Needed:  None Criminal Activity/Legal Involvement Pertinent to Current Situation/Hospitalization:  No - Comment as needed Significant Relationships:  Adult Children (grandson) Lives with:  Other (Comment) (grandson) Do you feel safe going back to the place where you live?  No Need for family participation in patient care:     Care giving concerns:  Pt lives at home with grandson- current too weak/sick to return home with available support at home.   Social Worker assessment / plan:  CSW spoke with pt at bedside concerning PT recommendation for SNF.  Patient acknowledges how weak she is and that she would be unsafe at home.  CSW explained SNF referral process and SNF facility set up.  Employment status:  Disabled (Comment on whether or not currently receiving Disability) Insurance information:  Managed Medicare PT Recommendations:  [place] / Referral to community resources:  [place]  Patient/Family's Response to care:  Pt agreeable to SNF placement at this time realistic about her level of needs at this time.  Patient/Family's Understanding of and Emotional Response to Diagnosis, Current Treatment, and Prognosis:  Pt somewhat flat during interview- seems to be overwhelmed by her illness at this time.  Emotional Assessment Appearance:  Appears stated  age Attitude/Demeanor/Rapport:    Affect (typically observed):  Appropriate Orientation:  Oriented to Self, Oriented to Place, Oriented to  Time, Oriented to Situation Alcohol / Substance use:  Not Applicable Psych involvement (Current and /or in the community):  No (Comment)  Discharge Needs  Concerns to be addressed:  Care Coordination Readmission within the last 30 days:  No Current discharge risk:  Physical Impairment Barriers to Discharge:  Continued Medical Work up   Jorge Ny, LCSW 04/19/2016, 1:11 PM

## 2016-04-19 NOTE — NC FL2 (Signed)
[place] LEVEL OF CARE SCREENING TOOL     IDENTIFICATION  Patient Name: [name] Birthdate: Aug 14, 1955 Sex: female Admission Date (Current Location): 04/15/2016  [facility] and [place] Number:  Herbalist and Address:  [facility], [place] [address], [place], [place] [address]      Provider Number: [id number]  Attending Physician Name and Address:  Lucious Groves, DO  Relative Name and Phone Number:       Current Level of Care: Hospital Recommended Level of Care: [place] Prior Approval Number:    Date Approved/Denied:   PASRR Number: [id number]  Discharge Plan: SNF    Current Diagnoses: Patient Active Problem List   Diagnosis Date Noted  . Recurrent left pleural effusion   . Acute on chronic respiratory failure with hypoxia and hypercapnia (HCC)   . Centrilobular emphysema ([place])   . Dyspnea on exertion   . Acute respiratory failure ([place]) 04/15/2016  . Community acquired pneumonia 04/15/2016  . Constipation   . Anemia, normocytic normochromic   . History of adenocarcinoma of lung   . Vitamin D deficiency 02/15/2016  . Oral candidiasis 02/15/2016  . Bronchitis, chronic obstructive w acute bronchitis ([place]) 02/05/2016  . Elevated alkaline phosphatase level 02/05/2016  . Pleural effusion, left 02/05/2016  . Acute kidney injury ([place]) 02/04/2016  . CKD (chronic kidney disease) 02/04/2016  . Benign paroxysmal positional vertigo 09/10/2015  . Subjective tinnitus 09/10/2015  . Chronic venous insufficiency   . COPD with exacerbation ([place]) 04/10/2015  . 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 Primary adenocarcinoma of left lung ([place]) 12/04/2014  . Acute on chronic respiratory failure ([place]) 07/01/2013  . Healthcare maintenance 02/06/2013  . Chronic diastolic heart failure ([place]) 11/05/2012  . Hyperlipidemia   . Financial difficulties 06/15/2010  . Pulmonary hypertension 01/05/2009  . Obesity,  Class II, BMI 35-39.9 12/31/2008  . Sleep apnea 12/31/2008  . Tobacco abuse 12/22/2008  . 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 Severe chronic obstructive pulmonary disease ([place]) 12/22/2008  . Gastroesophageal reflux disease 12/22/2008  . 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 ([place]) 12/22/2008    Orientation RESPIRATION BLADDER Height & Weight     Self, Time, Situation, Place  O2 (4L [place]) Continent Weight: 264 lb 1.6 oz (119.8 kg) Height:  5' 4" (162.6 cm)  BEHAVIORAL SYMPTOMS/MOOD NEUROLOGICAL BOWEL NUTRITION STATUS      Continent Diet (cardiac, carb modified)  AMBULATORY STATUS COMMUNICATION OF NEEDS Skin   Limited Assist Verbally Normal                       Personal Care Assistance Level of Assistance  Bathing, Dressing Bathing Assistance: Limited assistance   Dressing Assistance: Limited assistance     Functional Limitations Info             SPECIAL CARE FACTORS FREQUENCY  PT (By licensed PT), OT (By licensed OT)     PT Frequency: 5/wk OT Frequency: 5/wk            Contractures      Additional Factors Info  Code Status, Allergies, Psychotropic, Insulin Sliding Scale Code Status Info: FULL Allergies Info: Fluconazole, Atorvastatin Psychotropic Info: wellbutrin Insulin Sliding Scale Info: 4/day       Current Medications (04/19/2016):  This is the current hospital active medication list Current Facility-Administered Medications  Medication Dose Route Frequency Provider Last Rate Last Dose  .  acetaminophen (TYLENOL) tablet 650 mg  650 mg Oral Q6H PRN Shela Leff, MD       Or  . acetaminophen (TYLENOL) suppository 650 mg  650 mg Rectal Q6H PRN Shela Leff, MD      . albuterol (PROVENTIL) (2.5 MG/3ML) 0.083% nebulizer solution 2.5 mg  2.5 mg Nebulization Q2H PRN Chesley Mires, MD   2.5 mg at 04/19/16 1301  . arformoterol (BROVANA) nebulizer solution 15 mcg  15 mcg Nebulization BID  Chesley Mires, MD   15 mcg at 04/19/16 0839  . azithromycin (ZITHROMAX) tablet 250 mg  250 mg Oral Daily LAQUASHIA MERGENTHALER, RPH   250 mg at 04/18/16 2109  . budesonide (PULMICORT) nebulizer solution 0.5 mg  0.5 mg Nebulization BID Chesley Mires, MD   0.5 mg at 04/19/16 0839  . buPROPion (WELLBUTRIN SR) 12 hr tablet 150 mg  150 mg Oral BID Shela Leff, MD   150 mg at 04/19/16 1026  . enoxaparin (LOVENOX) injection 60 mg  0.5 mg/kg Subcutaneous Q24H Jule Ser, DO   60 mg at 04/19/16 1027  . furosemide (LASIX) tablet 40 mg  40 mg Oral Daily Jule Ser, DO   40 mg at 04/19/16 1026  . [START ON 04/20/2016] gabapentin (NEURONTIN) capsule 300 mg  300 mg Oral QPM Jule Ser, DO      . insulin aspart (novoLOG) injection 0-20 Units  0-20 Units Subcutaneous TID WC Ledell Noss, MD   3 Units at 04/19/16 1256  . insulin aspart (novoLOG) injection 0-5 Units  0-5 Units Subcutaneous QHS Ledell Noss, MD      . lidocaine (XYLOCAINE) 1 % (with pres) injection 10 mL  10 mL Intradermal Once Alexa Angela Burke, MD      . MEDLINE mouth rinse  15 mL Mouth Rinse BID Annia Belt, MD   15 mL at 04/19/16 1000  . pantoprazole (PROTONIX) EC tablet 40 mg  40 mg Oral Daily Shela Leff, MD   40 mg at 04/19/16 1026  . polyethylene glycol (MIRALAX / GLYCOLAX) packet 17 g  17 g Oral Daily Shela Leff, MD   17 g at 04/17/16 1001  . predniSONE (DELTASONE) tablet 20 mg  20 mg Oral Q breakfast Marijean Heath, NP   20 mg at 04/19/16 1027  . rosuvastatin (CRESTOR) tablet 20 mg  20 mg Oral Daily Shela Leff, MD   20 mg at 04/19/16 1027  . senna-docusate (Senokot-S) tablet 2 tablet  2 tablet Oral QHS PRN Shela Leff, MD      . umeclidinium bromide (INCRUSE ELLIPTA) 62.5 MCG/INH 1 puff  1 puff Inhalation Daily Chesley Mires, MD   1 puff at 04/19/16 0842  . vitamin B-12 (CYANOCOBALAMIN) tablet 1,000 mcg  1,000 mcg Oral Daily Shela Leff, MD   1,000 mcg at 04/19/16 1027  . Vitamin D (Ergocalciferol)  (DRISDOL) capsule 50,000 Units  50,000 Units Oral Q7 days Shela Leff, MD   50,000 Units at 04/18/16 1002     Discharge Medications: Please see discharge summary for a list of discharge medications.  Relevant Imaging Results:  Relevant Lab Results:   Additional Information SS# [id number]  Jorge Ny, LCSW

## 2016-04-19 NOTE — Progress Notes (Signed)
PCCM Progress Note  Subjective: Can't lay flat >> gets short of breath.  She doesn't think she can do V/Q scan.  She denies chest pain, but still has cough.  Vital signs: BP (!) 134/59 (BP Location: Right Arm)   Pulse 64   Temp 98.6 F (37 C) (Oral)   Resp 15   Ht _0  (1.626 m)   Wt 264 lb 1.6 oz (119.8 kg)   SpO2 95%   BMI 45.33 kg/m   Intake/outpt: I/O last 3 completed shifts: In: 1224 [P.O.:1224] Out: 2825 [Urine:2825]  General: alert Neuro: normal strength Eyes: pupils reactive ENT: no stridor Cardiac: regular, no murmur Chest: decreased BS Lt base, no wheeze Abd: soft, non tender Ext: no edema Skin: no rashes  CMP Latest Ref Rng & Units 04/19/2016 04/18/2016 04/17/2016  Glucose 65 - 99 mg/dL 155(H) 146(H) 97  BUN 6 - 20 mg/dL 32(H) 33(H) 34(H)  Creatinine 0.44 - 1.00 mg/dL 1.61(H) 1.80(H) 1.88(H)  Sodium 135 - 145 mmol/L 138 140 141  Potassium 3.5 - 5.1 mmol/L 4.4 4.9 5.0  Chloride 101 - 111 mmol/L 97(L) 99(L) 101  CO2 22 - 32 mmol/L 34(H) 33(H) 33(H)  Calcium 8.9 - 10.3 mg/dL 9.1 9.3 8.8(L)  Total Protein 6.5 - 8.1 g/dL - - -  Total Bilirubin 0.3 - 1.2 mg/dL - - -  Alkaline Phos 38 - 126 U/L - - -  AST 15 - 41 U/L - - -  ALT 14 - 54 U/L - - -    CBC Latest Ref Rng & Units 04/18/2016 04/17/2016 04/16/2016  WBC 4.0 - 10.5 K/uL 9.5 10.7(H) 12.5(H)  Hemoglobin 12.0 - 15.0 g/dL 8.1(L) 8.0(L) 8.2(L)  Hematocrit 36.0 - 46.0 % 26.7(L) 26.2(L) 25.7(L)  Platelets 150 - 400 K/uL 240 272 258    ABG    Component Value Date/Time   PHART 7.305 (L) 04/15/2016 0612   PCO2ART 65.6 (HH) 04/15/2016 0612   PO2ART 61.0 (L) 04/15/2016 0612   HCO3 32.4 (H) 04/15/2016 0612   TCO2 34 04/15/2016 0612   O2SAT 86.0 04/15/2016 0612     Imaging: Dg Chest 2 View  Result Date: 04/19/2016 CLINICAL DATA:  Pleural effusion EXAM: CHEST  2 VIEW COMPARISON:  04/15/2006 FINDINGS: Stable enlarged cardiac silhouette. Moderate LEFT effusion is not changed. Small RIGHT effusion unchanged.  Central venous congestion unchanged. Degenerative osteophytosis of the spine. IMPRESSION: No change in moderate LEFT effusion. Electronically Signed   By: Suzy Bouchard M.D.   On: 04/19/2016 08:57   Assessment/plan:  Acute on chronic hypoxic/hypercapnic respiratory failure. - oxygen to keep SpO2 90 to 95% - might benefit from NIPPV as outpt - d/c V/Q scan for now >> would like to do thoracentesis first and then reassess whether she needs V/Q scan  COPD/emphysema [PFT 12/02/14 >> FEV1 1.09 (42%), FEV1% 69, TLC 4.68 (92%), DLCO 32%]. - continue brovana, pulmicort, incruse, and prn albuterol - wean of prednisone as able - Abx per primary team  Stage IIa NSCLC (adenocarcinoma) dx August 2016 s/p XRT with radiation fibrosis. - f/u with oncology as outpt  Presumed OSA,OHS >> she reports never having sleep study before. - CPAP qhs - will need sleep study as outpt  Chronic diastolic CHF (Echo 8/84/16 >> EF 55 to 60%, grade 1 DD). - negative fluid balance as able  Recurrent Lt leural effusion. - she had Lt thoracentesis 02/06/16, but results non diagnostic - will arrange for thoracentesis  Chesley Mires, MD [place] 04/19/2016, 9:20 AM Pager:  [phone] After 3pm call: [phone]

## 2016-04-19 NOTE — Procedures (Signed)
Thoracentesis Procedure Note  Pre-operative Diagnosis: Recurrent Lt pleural effusion  Post-operative Diagnosis: same  Indications: 61 yo female with dyspnea, hx of NSCLC, and recurrent Lt pleural effusion.  Procedure Details  Consent: Informed consent was obtained. Risks of the procedure were discussed including: infection, bleeding, pain, pneumothorax.  Under sterile conditions the patient was positioned. Betadine solution and sterile drapes were utilized.  1% plain lidocaine was used to anesthetize the Lt 7 rib space. Fluid was obtained without any difficulties and minimal blood loss.  A dressing was applied to the wound and wound care instructions were provided.   Findings 600 ml of cloudy, amber pleural fluid was obtained.  Complications:  Patient complained of chest discomfort and procedure was stopped.  Her symptoms resolved spontaneously after few minutes.          Condition: stable  Plan Post procedure chest xray pending Will send fluid for glucose, protein, LDH, cell count, culture, and cytology  Attending Attestation: I performed the procedure.  Chesley Mires, MD [facility] Pulmonary/Critical Care 04/19/2016, 11:30 AM Pager:  [phone] After 3pm call: [phone]

## 2016-04-20 LAB — CULTURE, BLOOD (ROUTINE X 2)
Culture: NO GROWTH
Culture: NO GROWTH

## 2016-04-20 LAB — BASIC METABOLIC PANEL
Anion gap: 10 (ref 5–15)
BUN: 31 mg/dL — AB (ref 6–20)
CHLORIDE: 96 mmol/L — AB (ref 101–111)
CO2: 34 mmol/L — ABNORMAL HIGH (ref 22–32)
Calcium: 9.2 mg/dL (ref 8.9–10.3)
Creatinine, Ser: 1.67 mg/dL — ABNORMAL HIGH (ref 0.44–1.00)
GFR calc Af Amer: 37 mL/min — ABNORMAL LOW (ref >60)
GFR calc non Af Amer: 32 mL/min — ABNORMAL LOW (ref >60)
Glucose, Bld: 106 mg/dL — ABNORMAL HIGH (ref 65–99)
POTASSIUM: 4.5 mmol/L (ref 3.5–5.1)
SODIUM: 140 mmol/L (ref 135–145)

## 2016-04-20 LAB — GLUCOSE, CAPILLARY
GLUCOSE-CAPILLARY: 120 mg/dL — AB (ref 65–99)
GLUCOSE-CAPILLARY: 231 mg/dL — AB (ref 65–99)
GLUCOSE-CAPILLARY: 172 mg/dL — AB (ref 65–99)
GLUCOSE-CAPILLARY: 171 mg/dL — AB (ref 65–99)

## 2016-04-20 LAB — CBC
HCT: 28.9 % — ABNORMAL LOW (ref 36.0–46.0)
HEMOGLOBIN: 9 g/dL — AB (ref 12.0–15.0)
MCH: 29.8 pg (ref 26.0–34.0)
MCHC: 31.1 g/dL (ref 30.0–36.0)
MCV: 95.7 fL (ref 78.0–100.0)
PLATELETS: 223 10*3/uL (ref 150–400)
RBC: 3.02 MIL/uL — AB (ref 3.87–5.11)
RDW: 14.4 % (ref 11.5–15.5)
WBC: 9.7 10*3/uL (ref 4.0–10.5)

## 2016-04-20 MED ORDER — FUROSEMIDE 80 MG PO TABS
80.0000 mg | ORAL_TABLET | Freq: Every day | ORAL | Status: DC
  Administered 2016-04-21: 80 mg via ORAL
  Filled 2016-04-20: qty 1

## 2016-04-20 NOTE — Progress Notes (Signed)
Pt chooses [facility]- they have started [facility]- will likely bet 24 hours before we hear from insurance  CSW will continue to follow  Jorge Ny, [place] Social Worker [phone]

## 2016-04-20 NOTE — Care Management Note (Addendum)
Case Management Note  Patient Details  Name: [name] MRN: [id number] Date of Birth: 04-17-1955  Subjective/Objective:  Pt presented for Respiratory Distress-COPD exacerbation. S/p thoracentesis. Pt is from home with son and plan will be SNF once stable to [facility]. CM did reach out to MD in regards to disposition plan. Awaiting return call.                   Action/Plan: CSW monitoring for disposition needs to SNF. CM will continue to monitor.   Expected Discharge Date:                  Expected Discharge Plan:  [place]  In-House Referral:  Clinical Social Work  Discharge planning Services  CM Consult  Post Acute Care Choice:  NA Choice offered to:  NA  DME Arranged:  N/A DME Agency:  NA  HH Arranged:  NA HH Agency:  NA  Status of Service:  Completed, signed off  If discussed at [place] of Stay Meetings, dates discussed:  04-21-16  Additional Comments:  Bethena Roys, RN 04/20/2016, 12:07 PM

## 2016-04-20 NOTE — Progress Notes (Signed)
Subjective: [name] feels like her breathing has improved somewhat after thoracentesis procedure yesterday.   Objective:  Vital signs in last 24 hours: Vitals:   04/19/16 2024 04/19/16 2100 04/20/16 0512 04/20/16 0906  BP: (!) 116/59  117/70   Pulse: 83 69 86   Resp: 17 16 (!) 21   Temp: 98.3 F (36.8 C)  98.5 F (36.9 C)   TempSrc: Oral  Oral   SpO2: 94% 94% 91% 94%  Weight:   260 lb 3.2 oz (118 kg)   Height:   _0  (1.626 m)    Physical Exam  Constitutional: She appears well-developed and well-nourished. No distress.  Eyes: Conjunctivae are normal. No scleral icterus.  Cardiovascular: Normal rate and regular rhythm.   No murmur heard. Trace pitting edema  Pulmonary/Chest: Effort normal and breath sounds normal. No respiratory distress. She has no wheezes. She has no rales.  Increased air entry   Abdominal: Soft. Bowel sounds are normal. She exhibits no distension. There is no tenderness.  Skin: She is not diaphoretic.  Psychiatric: She has a normal mood and affect. Her behavior is normal.    Assessment/Plan: Pt is a 61 y.o. yo female with a PMHx of COPD on home oxygen, pulmonary hypertension, stage IIa adenocarcinoma of the lung, diastolic dysfunction, s/p drug eluting stent, chronic venous insufficiency, and CKD. who was admitted on 04/15/2016 with symptoms of dyspnea without change in chronic cough, which was determined to be secondary to COPD exacerbation.   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 Yesterday she had thoracentesis with 600 cc of amber transudative fluid pulled off. She feels that the sensation of difficulty breathing has improved since the thoracentesis procedure. Follow up chest xray after the procedure showed some improvement in the effusion.  -PCCM is following, appreciate their recommendations -continue duoneb q6h, brovana, pulmicort, and incruse  -follow up blood cx>> NGTD  -nasal canula oxygen to keep SpO2 90-94%    -follow up pleural fluid cytology   Chronic diastolic heart failure  ECHO 04/2016 EF 50-55% and G1DD. Abdominal distention, pulmonary crackles have improved, still has trace pitting edema.  Weight stable.  -ordered lasix 80 mg qd    -daily weights, strict I&Os   Anemia of chronic kidney disease  Normal ferritin, low TIBC and iron. Denies blood loss. Hgb 7.2, stable in the 9s s/p transfusion.  -vitamin B12 supplementation   Acute on chronic kidney disease  Crt stable at 1.6, improving from 2.6 on admission. Has been steadily rising from baseline 0.8 in 05/2015.   - holding lisinopril, metformin   Non insulin dependent Type 2 DM  A1c 8/17 6.5. CBG 150-200 on moderate ISS. She is on prednisone for COPD exacerbation.  -continue CBG monitoring  -continue resistant ISS with HS coverage.   GERD  -continue home med pantoprazole 40 mg qd.   Constipation  -miralax  -senna   Dispo: Anticipated discharge awaiting SNF placement  Ledell Noss, MD 04/20/2016, 12:41 PM Pager: [phone]

## 2016-04-20 NOTE — Progress Notes (Signed)
Physical Therapy Treatment Patient Details Name: [name] MRN: [id number] DOB: 05/08/1955 Today's Date: 04/20/2016    History of Present Illness Patient is a 61 y/o female with hx of stage 2 NSCLC, pulmonary HTN, CAD, CHF, COPD, prediabetes, HLD presents with SOB. 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     PT Comments    Pt progressing slowly with mobility during PT sessions. Pt able to ambulate 12 ft X1 and 8 ft X1 with rw and min guard assistance on 4LO2 (SpO2 85-93%. Based upon the patient's current mobility and activity tolerance, recommending SNF for further rehabilitation following acute stay.   Follow Up Recommendations  SNF     Equipment Recommendations   (to be determined at next venue)    Recommendations for Other Services       Precautions / Restrictions Precautions Precautions: Fall Precaution Comments: watch 02 Restrictions Weight Bearing Restrictions: No    Mobility  Bed Mobility Overal bed mobility: Modified Independent Bed Mobility: Sidelying to Sit   Sidelying to sit: Modified independent (Device/Increase time)       General bed mobility comments: HOB elevated, using rail to assist.   Transfers Overall transfer level: Needs assistance Equipment used: Rolling walker (2 wheeled) Transfers: Sit to/from Stand Sit to Stand: Min guard         General transfer comment: Min guard for safety, transfer performed from bed X2.   Ambulation/Gait Ambulation/Gait assistance: Min guard Ambulation Distance (Feet):  (12 ft X1, 8 ft X1) Assistive device: Rolling walker (2 wheeled) Gait Pattern/deviations: Step-through pattern;Trunk flexed Gait velocity: decreased    General Gait Details: slow but steady pattern, using rw, 4L02 with SpO2 85-93%. Seated rest taken sitting EOB between ambulations.    Stairs            Wheelchair Mobility    Modified Rankin (Stroke Patients Only)       Balance Overall balance assessment: Needs assistance Sitting-balance support: Feet supported;No upper extremity supported Sitting balance-Leahy Scale: Good     Standing balance support: During functional activity Standing balance-Leahy Scale: Fair Standing balance comment: able to stand static without device.                    Cognition Arousal/Alertness: Awake/alert Behavior During Therapy: WFL for tasks assessed/performed Overall Cognitive Status: Within Functional Limits for tasks assessed                      Exercises      General Comments        Pertinent Vitals/Pain Pain Assessment: No/denies pain    Home Living                      Prior Function            PT Goals (current goals can now be found in the care plan section) Acute Rehab PT Goals Patient Stated Goal: get better, get out of the hospital PT Goal Formulation: With patient Time For Goal Achievement: 05/02/16 Potential to Achieve Goals: Good Progress towards PT goals: Progressing toward goals    Frequency    Min 3X/week      PT Plan Current plan remains appropriate  Co-evaluation             End of Session Equipment Utilized During Treatment: Oxygen (pt declined use of gait belt) Activity Tolerance: Patient limited by fatigue Patient left: in chair;with call bell/phone within reach;with nursing/sitter in room     Time: 2527-1292 PT Time Calculation (min) (ACUTE ONLY): 19 min  Charges:  $Gait Training: 8-22 mins                    G Codes:      Cassell Clement, PT, CSCS Pager [phone] Office [phone]  04/20/2016, 2:46 PM

## 2016-04-20 NOTE — Progress Notes (Signed)
Internal Medicine Attending:   I saw and examined the patient. I reviewed the resident's note and I agree with the resident's findings and plan as documented in the resident's note. [name] is feeling symptomatically better after her thoracentesis but no completely back to her pre hospital baseline.  She is willing to go to SNF after discharge.  Her thoracentesis yesterday is exudative which is concerning given her history of lung cancer.  Pulmonology is following.  We will plan to repeat a CXR tomorrow to evaluate for re accumulation.  Otherwise we are nearing discharge. I did have the opportunity to discuss with her some of her [place]. She enjoys watching TV at home and taking care of her granddaughter in the afternoon while her daughter works.  She has never been intubated before but has been told that if she is she would likely "not come off the ventilator" which is concerning for her.  She would not want a prolonged ICU stay however at this time she would be willing to be intubated and even have CPR in the event of cardiopulmonary arrest.  However she admits that she would only want interventions that would have a good likely hood of a positive outcome and would not want prolonged intervention.  She has partially completed an advance directive but never had it notarized.  We did discuss some of the limitations and adverse effects of mechanical ventilation and CPR and I encouraged her to have further discussions with her family about her wishes. Marland Kitchen

## 2016-04-20 NOTE — Progress Notes (Signed)
PCCM Progress Note  Subjective:  Pt denies shortness of breath, chest pain.  O2 needs at baseline.      Vital signs: BP 117/70 (BP Location: Right Arm)   Pulse 86   Temp 98.5 F (36.9 C) (Oral)   Resp (!) 21   Ht _0  (1.626 m)   Wt 260 lb 3.2 oz (118 kg)   SpO2 94%   BMI 44.66 kg/m   Intake/outpt: I/O last 3 completed shifts: In: 360 [P.O.:360] Out: 2900 [Urine:2900]  General: adult female in NAD sitting on side of bed Neuro: AAOx4, speech clear ENT: mm pink/moist Cardiac: s1s2 rrr, no m/r/g Chest: even/non-labored, lungs bilaterally clear anterior, diminished posterior Abd: soft, non-tender Ext: no acute deformities  Skin: warm/dry, no edema   CMP Latest Ref Rng & Units 04/20/2016 04/19/2016 04/18/2016  Glucose 65 - 99 mg/dL 106(H) 155(H) 146(H)  BUN 6 - 20 mg/dL 31(H) 32(H) 33(H)  Creatinine 0.44 - 1.00 mg/dL 1.67(H) 1.61(H) 1.80(H)  Sodium 135 - 145 mmol/L 140 138 140  Potassium 3.5 - 5.1 mmol/L 4.5 4.4 4.9  Chloride 101 - 111 mmol/L 96(L) 97(L) 99(L)  CO2 22 - 32 mmol/L 34(H) 34(H) 33(H)  Calcium 8.9 - 10.3 mg/dL 9.2 9.1 9.3  Total Protein 6.5 - 8.1 g/dL - 6.2(L) -  Total Bilirubin 0.3 - 1.2 mg/dL - - -  Alkaline Phos 38 - 126 U/L - - -  AST 15 - 41 U/L - - -  ALT 14 - 54 U/L - - -    CBC Latest Ref Rng & Units 04/20/2016 04/18/2016 04/17/2016  WBC 4.0 - 10.5 K/uL 9.7 9.5 10.7(H)  Hemoglobin 12.0 - 15.0 g/dL 9.0(L) 8.1(L) 8.0(L)  Hematocrit 36.0 - 46.0 % 28.9(L) 26.7(L) 26.2(L)  Platelets 150 - 400 K/uL 223 240 272    ABG    Component Value Date/Time   PHART 7.305 (L) 04/15/2016 0612   PCO2ART 65.6 (HH) 04/15/2016 0612   PO2ART 61.0 (L) 04/15/2016 0612   HCO3 32.4 (H) 04/15/2016 0612   TCO2 34 04/15/2016 0612   O2SAT 86.0 04/15/2016 0612     Imaging: Dg Chest 2 View  Result Date: 04/19/2016 CLINICAL DATA:  Pleural effusion EXAM: CHEST  2 VIEW COMPARISON:  04/15/2006 FINDINGS: Stable enlarged cardiac silhouette. Moderate LEFT effusion is not  changed. Small RIGHT effusion unchanged. Central venous congestion unchanged. Degenerative osteophytosis of the spine. IMPRESSION: No change in moderate LEFT effusion. Electronically Signed   By: Suzy Bouchard M.D.   On: 04/19/2016 08:57   Dg Chest Port 1 View  Result Date: 04/19/2016 CLINICAL DATA:  Thoracentesis. EXAM: PORTABLE CHEST 1 VIEW COMPARISON:  04/19/2016.  02/06/2016. FINDINGS: Cardiomegaly with bilateral diffuse pulmonary interstitial prominence and bilateral pleural effusions, left side greater right. Findings consistent with congestive heart failure. Findings have progressed slightly from prior exam. IMPRESSION: Congestive heart failure with bilateral pulmonary edema with bilateral pleural effusions, left side greater right. Findings have progressed slightly from prior exam. Electronically Signed   By: [place]   On: 04/19/2016 12:17   Assessment/plan:  Acute on chronic hypoxic/hypercapnic respiratory failure. - O2 to keep sats 90-95% - consider NIPPV as outpatient  - hold V/Q for now  COPD/emphysema [PFT 12/02/14 >> FEV1 1.09 (42%), FEV1% 69, TLC 4.68 (92%), DLCO 32%]. 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 - f/u with ONC as outpatient  Presumed OSA,OHS >> she reports never having sleep study before. - CPAP QHS - will need sleep study as outpatient   Chronic diastolic CHF (Echo 6/81/59 >> EF 55 to 60%, grade 1 DD). - negative fluid balance as able - lasix 40 mg BID  Recurrent Lt leural effusion - s/p L thora 11/4 with non-diagnostic results.  Repeat thora 1/16 exudative by LDH (5.6) and protein (0.6) - follow pleural fluid - await cytology - may need pleurx depending on rate of reacumulation or if fluid positive for malignant cells - follow CXR / effusion   Noe Gens, NP-C [facility] Pgr: [phone] or if no answer [phone] 04/20/2016, 11:36 AM

## 2016-04-21 ENCOUNTER — Other Ambulatory Visit: Payer: Self-pay | Admitting: Internal Medicine

## 2016-04-21 ENCOUNTER — Inpatient Hospital Stay (HOSPITAL_COMMUNITY): Payer: Medicare HMO

## 2016-04-21 LAB — BASIC METABOLIC PANEL
ANION GAP: 8 (ref 5–15)
BUN: 32 mg/dL — ABNORMAL HIGH (ref 6–20)
CO2: 36 mmol/L — ABNORMAL HIGH (ref 22–32)
Calcium: 9.1 mg/dL (ref 8.9–10.3)
Chloride: 97 mmol/L — ABNORMAL LOW (ref 101–111)
Creatinine, Ser: 1.66 mg/dL — ABNORMAL HIGH (ref 0.44–1.00)
GFR calc Af Amer: 37 mL/min — ABNORMAL LOW (ref >60)
GFR, EST NON AFRICAN AMERICAN: 32 mL/min — AB (ref >60)
Glucose, Bld: 125 mg/dL — ABNORMAL HIGH (ref 65–99)
POTASSIUM: 4.4 mmol/L (ref 3.5–5.1)
SODIUM: 141 mmol/L (ref 135–145)

## 2016-04-21 LAB — GLUCOSE, CAPILLARY
GLUCOSE-CAPILLARY: 123 mg/dL — AB (ref 65–99)
GLUCOSE-CAPILLARY: 174 mg/dL — AB (ref 65–99)
GLUCOSE-CAPILLARY: 218 mg/dL — AB (ref 65–99)
Glucose-Capillary: 185 mg/dL — ABNORMAL HIGH (ref 65–99)

## 2016-04-21 MED ORDER — BUDESONIDE 0.5 MG/2ML IN SUSP: 0.5000 mg | Freq: Two times a day (BID) | RESPIRATORY_TRACT | 0 refills | Status: DC

## 2016-04-21 MED ORDER — UMECLIDINIUM BROMIDE 62.5 MCG/INH IN AEPB: 1.0000 | INHALATION_SPRAY | Freq: Every day | RESPIRATORY_TRACT | 0 refills | Status: DC

## 2016-04-21 MED ORDER — FUROSEMIDE 10 MG/ML IJ SOLN
40.0000 mg | Freq: Two times a day (BID) | INTRAMUSCULAR | Status: DC
  Administered 2016-04-21 – 2016-04-22 (×2): 40 mg via INTRAVENOUS
  Filled 2016-04-21 (×2): qty 4

## 2016-04-21 MED ORDER — ARFORMOTEROL TARTRATE 15 MCG/2ML IN NEBU: 15.0000 ug | INHALATION_SOLUTION | Freq: Two times a day (BID) | RESPIRATORY_TRACT | 0 refills | Status: DC

## 2016-04-21 NOTE — Progress Notes (Signed)
PCCM Progress Note  Subjective:  Pt reports SOB improved post thoracentesis but remains dyspneic with ambulation (short distance, ~ 15 feet)    Vital signs: BP 110/61 (BP Location: Right Arm)   Pulse 71   Temp 98.2 F (36.8 C) (Oral)   Resp 19   Ht _0  (1.626 m)   Wt 257 lb 14.4 oz (117 kg)   SpO2 96%   BMI 44.27 kg/m   Intake/outpt: I/O last 3 completed shifts: In: 2595 [P.O.:1438] Out: 4700 [Urine:4700]  General: obese female in NAD, sitting at bedside HEENT: mm pink/moist Neuro: AAOx4, speech clear, MAE  CV: s1s2 rrr, no m/r/g PULM: non-labored, diminished left posterior with fine crackles, otherwise clear GI: obese/soft, bsx4 active  Extremities: no acute deformities  Skin: warm/dry, no edema   CMP Latest Ref Rng & Units 04/21/2016 04/20/2016 04/19/2016  Glucose 65 - 99 mg/dL 125(H) 106(H) 155(H)  BUN 6 - 20 mg/dL 32(H) 31(H) 32(H)  Creatinine 0.44 - 1.00 mg/dL 1.66(H) 1.67(H) 1.61(H)  Sodium 135 - 145 mmol/L 141 140 138  Potassium 3.5 - 5.1 mmol/L 4.4 4.5 4.4  Chloride 101 - 111 mmol/L 97(L) 96(L) 97(L)  CO2 22 - 32 mmol/L 36(H) 34(H) 34(H)  Calcium 8.9 - 10.3 mg/dL 9.1 9.2 9.1  Total Protein 6.5 - 8.1 g/dL - - 6.2(L)  Total Bilirubin 0.3 - 1.2 mg/dL - - -  Alkaline Phos 38 - 126 U/L - - -  AST 15 - 41 U/L - - -  ALT 14 - 54 U/L - - -    CBC Latest Ref Rng & Units 04/20/2016 04/18/2016 04/17/2016  WBC 4.0 - 10.5 K/uL 9.7 9.5 10.7(H)  Hemoglobin 12.0 - 15.0 g/dL 9.0(L) 8.1(L) 8.0(L)  Hematocrit 36.0 - 46.0 % 28.9(L) 26.7(L) 26.2(L)  Platelets 150 - 400 K/uL 223 240 272    ABG    Component Value Date/Time   PHART 7.305 (L) 04/15/2016 0612   PCO2ART 65.6 (HH) 04/15/2016 0612   PO2ART 61.0 (L) 04/15/2016 0612   HCO3 32.4 (H) 04/15/2016 0612   TCO2 34 04/15/2016 0612   O2SAT 86.0 04/15/2016 0612     Imaging: Dg Chest 2 View  Result Date: 04/21/2016 CLINICAL DATA:  Dyspnea, history lung cancer, CHF, coronary artery disease, pulmonary hypertension, COPD  EXAM: CHEST  2 VIEW COMPARISON:  04/19/2016 FINDINGS: Enlargement of cardiac silhouette with pulmonary vascular congestion. Enlarged central pulmonary arteries. LEFT pleural effusion and accompanying LEFT basilar atelectasis. Diffuse pulmonary edema question minimally improved. No pneumothorax. Bones diffusely demineralized. Atherosclerotic calcification at aortic arch. IMPRESSION: CHF with persistent pulmonary edema and LEFT pleural effusion. Aortic atherosclerosis. Electronically Signed   By: Lavonia Dana M.D.   On: 04/21/2016 10:07   Dg Chest Port 1 View  Result Date: 04/19/2016 CLINICAL DATA:  Thoracentesis. EXAM: PORTABLE CHEST 1 VIEW COMPARISON:  04/19/2016.  02/06/2016. FINDINGS: Cardiomegaly with bilateral diffuse pulmonary interstitial prominence and bilateral pleural effusions, left side greater right. Findings consistent with congestive heart failure. Findings have progressed slightly from prior exam. IMPRESSION: Congestive heart failure with bilateral pulmonary edema with bilateral pleural effusions, left side greater right. Findings have progressed slightly from prior exam. Electronically Signed   By: [place]   On: 04/19/2016 12:17   Assessment/plan:  Acute on chronic hypoxic/hypercapnic respiratory failure. - O2 at baseline, 4L - saturation goal 90-95% - pulmonary hygiene - hold V/Q scan for now  COPD/emphysema [PFT 12/02/14 >> FEV1 1.09 (42%), FEV1% 69, TLC 4.68 (92%), DLCO 32%]. 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 - Follow up with ONC as outpatient  Presumed OSA,OHS >> she reports never having sleep study before. - CPAP QHS - Will need sleep study as outpatient  Chronic diastolic CHF (Echo 0/31/59 >> EF 55 to 60%, grade 1 DD). - Negative fluid balance as able - Lasix 40 mg twice a day  Recurrent Lt leural  effusion - s/p L thora 11/4 with non-diagnostic results.  Repeat thora 1/16 lymph predominant / exudative by LDH (5.6) and protein (0.6) - Pleural fluid negative for malignancy x2 - Follow chest x-ray/effusion in 4-5 days post discharge. If the fluid is reccumulating, will need follow-up with CVTS for possible pleurX catheter placement.     PCCM will sign off.  Please call back if we can be of further assistance.     Noe Gens, NP-C [facility] Pgr: [phone] or if no answer [phone] 04/21/2016, 11:50 AM

## 2016-04-21 NOTE — Progress Notes (Signed)
RT spoke with patient about wearing CPAP and the patient stated that she would call if she decides to wear the CPAP tonight. RT will continue to monitor.

## 2016-04-21 NOTE — Progress Notes (Signed)
Internal Medicine Attending:   I saw and examined the patient. I reviewed the resident's note and I agree with the resident's findings and plan as documented in the resident's note. [name] reports she is still doing a little better after her thoracentesis however continues to get very SOB ambulating to the bathroom even while using her supplemental O2.  She otherwise has no complaints.  Her weight is down to 257 from 266 on admission and she continues to be net negative on her home lasix dose (55m BID), however looking back I would guess that her "dry" weight may be in the range of 245-250.  Given that she has had two thoracentesis negative for malignancy and her chest Xray today is still showing recurrent left pleural effusion as well as central pulmonary venous congestion she may benefit from further diuresis.  Furthermore a partially treated pleural effusion due to CHF who make sense to cause her exudative result.  Therefore we will convert her back to 47mof IV lasix BID and reevaluate her tomorrow.

## 2016-04-21 NOTE — Progress Notes (Addendum)
Subjective: [name] feels like her breathing has continued to improve today. She feels ready to walk around her room and use the restroom on her own but knows that she is at risk of falling.   Objective:  Vital signs in last 24 hours: Vitals:   04/20/16 2120 04/21/16 0010 04/21/16 0421 04/21/16 0844  BP: 115/62  110/61   Pulse: 76 78 71   Resp: (!) 22 (!) 22 19   Temp: 98.4 F (36.9 C)  98.2 F (36.8 C)   TempSrc: Oral  Oral   SpO2: 100% 100% 97% 96%  Weight:   257 lb 14.4 oz (117 kg)   Height:       Physical Exam  Constitutional: She appears well-developed and well-nourished. No distress.  Eyes: Conjunctivae are normal. No scleral icterus.  Cardiovascular: Normal rate and regular rhythm.   No murmur heard. Trace pitting edema  Pulmonary/Chest: Effort normal. No respiratory distress. She has no wheezes. She has rales.  Decreased air entry   Abdominal: Soft. Bowel sounds are normal. She exhibits no distension. There is no tenderness.  Skin: She is not diaphoretic.  Psychiatric: She has a normal mood and affect. Her behavior is normal.    Assessment/Plan: Pt is a 61 y.o. yo female with a PMHx of COPD on home oxygen, pulmonary hypertension, stage IIa adenocarcinoma of the lung, diastolic dysfunction, s/p drug eluting stent, chronic venous insufficiency, and CKD. who was admitted on 04/15/2016 with symptoms of dyspnea without change in chronic cough, which was determined to be secondary to COPD exacerbation.   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 1/16 she had thoracentesis and felt gradual improvement in her breathing since that procedure. Effusion fluid cytology showed reactive mesothelial cells, no malignant cells. The chest xray this morning showed stable presence of the pleural effusion. PCCM has signed off and feel that she is ready for discharge with close follow up. They have recommended placing a pleurx cath if the effusion becomes symptomatic in the future.  -continue duoneb q6h, brovana, pulmicort, and incruse  -goal SpO2 90-94%    -effusion fluid culture >> NGTD   COPD GOLD 3D I have concern that she would not have good outcome in a cardiopulmonary resuscitation situation, as a team we expressed these concerns to her yesterday and we completed a MOST form together yesterday afternoon, she expressed desire to have full scope initial efforts at resuscitation but that she would not want to be on a ventilator for an extended period of time. The MOST form has been placed in her chart.   Chronic diastolic heart failure  ECHO 04/2016 EF 50-55% and G1DD. Abdominal distention and pitting edema have improved, pulmonary crackles have become more apparent now that she has better air entry. Chest xray continues to show pulmonary edema, trial of IV diuresis today.  Net output 7 liters, weight 117 kg down from 121 at admission.  -IV lasix  80 mg qd    -daily weights, strict I&Os   Anemia of chronic kidney disease  Normal ferritin, low TIBC and iron. Denies blood loss. Hgb 7.2, stable in the 9s s/p transfusion.  -vitamin B12 supplementation   Acute on chronic kidney disease  Crt stable at 1.6, improved from 2.6 on admission. Has been steadily rising from baseline 0.8 in 05/2015.   - holding lisinopril, metformin   Non insulin dependent Type 2 DM  A1c 8/17 6.5. CBG 120-180 on moderate ISS. She is on prednisone for COPD  exacerbation.  -continue CBG monitoring  -continue resistant ISS with HS coverage.   GERD  -continue home med pantoprazole 40 mg qd.   Constipation  -miralax  -senna   Dispo: Anticipated discharge 1-2 days.   Ledell Noss, MD 04/21/2016, 8:56 AM Pager: [phone]

## 2016-04-22 DIAGNOSIS — N189 Chronic kidney disease, unspecified: Secondary | ICD-10-CM | POA: Diagnosis not present

## 2016-04-22 DIAGNOSIS — J9600 Acute respiratory failure, unspecified whether with hypoxia or hypercapnia: Secondary | ICD-10-CM | POA: Diagnosis not present

## 2016-04-22 DIAGNOSIS — E662 Morbid (severe) obesity with alveolar hypoventilation: Secondary | ICD-10-CM | POA: Diagnosis not present

## 2016-04-22 DIAGNOSIS — J449 Chronic obstructive pulmonary disease, unspecified: Secondary | ICD-10-CM | POA: Diagnosis not present

## 2016-04-22 DIAGNOSIS — E1151 Type 2 diabetes mellitus with diabetic peripheral angiopathy without gangrene: Secondary | ICD-10-CM | POA: Diagnosis not present

## 2016-04-22 DIAGNOSIS — J90 Pleural effusion, not elsewhere classified: Secondary | ICD-10-CM | POA: Diagnosis not present

## 2016-04-22 DIAGNOSIS — E1122 Type 2 diabetes mellitus with diabetic chronic kidney disease: Secondary | ICD-10-CM | POA: Diagnosis not present

## 2016-04-22 DIAGNOSIS — R2689 Other abnormalities of gait and mobility: Secondary | ICD-10-CM | POA: Diagnosis not present

## 2016-04-22 DIAGNOSIS — J441 Chronic obstructive pulmonary disease with (acute) exacerbation: Secondary | ICD-10-CM | POA: Diagnosis not present

## 2016-04-22 DIAGNOSIS — N183 Chronic kidney disease, stage 3 (moderate): Secondary | ICD-10-CM | POA: Diagnosis not present

## 2016-04-22 DIAGNOSIS — Z888 Allergy status to other drugs, medicaments and biological substances status: Secondary | ICD-10-CM

## 2016-04-22 DIAGNOSIS — C3490 Malignant neoplasm of unspecified part of unspecified bronchus or lung: Secondary | ICD-10-CM | POA: Diagnosis not present

## 2016-04-22 DIAGNOSIS — J9621 Acute and chronic respiratory failure with hypoxia: Secondary | ICD-10-CM | POA: Diagnosis not present

## 2016-04-22 DIAGNOSIS — R278 Other lack of coordination: Secondary | ICD-10-CM | POA: Diagnosis not present

## 2016-04-22 DIAGNOSIS — K219 Gastro-esophageal reflux disease without esophagitis: Secondary | ICD-10-CM | POA: Diagnosis not present

## 2016-04-22 DIAGNOSIS — I5032 Chronic diastolic (congestive) heart failure: Secondary | ICD-10-CM | POA: Diagnosis not present

## 2016-04-22 DIAGNOSIS — I2510 Atherosclerotic heart disease of native coronary artery without angina pectoris: Secondary | ICD-10-CM | POA: Diagnosis not present

## 2016-04-22 DIAGNOSIS — C3492 Malignant neoplasm of unspecified part of left bronchus or lung: Secondary | ICD-10-CM | POA: Diagnosis not present

## 2016-04-22 DIAGNOSIS — I5033 Acute on chronic diastolic (congestive) heart failure: Secondary | ICD-10-CM | POA: Diagnosis not present

## 2016-04-22 DIAGNOSIS — D6489 Other specified anemias: Secondary | ICD-10-CM | POA: Diagnosis not present

## 2016-04-22 DIAGNOSIS — J189 Pneumonia, unspecified organism: Secondary | ICD-10-CM | POA: Diagnosis not present

## 2016-04-22 DIAGNOSIS — D631 Anemia in chronic kidney disease: Secondary | ICD-10-CM | POA: Diagnosis not present

## 2016-04-22 DIAGNOSIS — N184 Chronic kidney disease, stage 4 (severe): Secondary | ICD-10-CM | POA: Diagnosis not present

## 2016-04-22 DIAGNOSIS — R531 Weakness: Secondary | ICD-10-CM | POA: Diagnosis not present

## 2016-04-22 DIAGNOSIS — I10 Essential (primary) hypertension: Secondary | ICD-10-CM | POA: Diagnosis not present

## 2016-04-22 DIAGNOSIS — J9601 Acute respiratory failure with hypoxia: Secondary | ICD-10-CM | POA: Diagnosis not present

## 2016-04-22 LAB — BASIC METABOLIC PANEL
ANION GAP: 10 (ref 5–15)
BUN: 36 mg/dL — ABNORMAL HIGH (ref 6–20)
CALCIUM: 9 mg/dL (ref 8.9–10.3)
CO2: 37 mmol/L — ABNORMAL HIGH (ref 22–32)
Chloride: 92 mmol/L — ABNORMAL LOW (ref 101–111)
Creatinine, Ser: 2.06 mg/dL — ABNORMAL HIGH (ref 0.44–1.00)
GFR, EST AFRICAN AMERICAN: 29 mL/min — AB (ref >60)
GFR, EST NON AFRICAN AMERICAN: 25 mL/min — AB (ref >60)
Glucose, Bld: 127 mg/dL — ABNORMAL HIGH (ref 65–99)
POTASSIUM: 4 mmol/L (ref 3.5–5.1)
Sodium: 139 mmol/L (ref 135–145)

## 2016-04-22 LAB — GLUCOSE, CAPILLARY
GLUCOSE-CAPILLARY: 91 mg/dL (ref 65–99)
Glucose-Capillary: 163 mg/dL — ABNORMAL HIGH (ref 65–99)
Glucose-Capillary: 190 mg/dL — ABNORMAL HIGH (ref 65–99)

## 2016-04-22 LAB — BODY FLUID CULTURE: CULTURE: NO GROWTH

## 2016-04-22 MED ORDER — FUROSEMIDE 40 MG PO TABS: 40.0000 mg | ORAL_TABLET | Freq: Every day | ORAL | Status: DC

## 2016-04-22 MED ORDER — FUROSEMIDE 80 MG PO TABS: 80.0000 mg | ORAL_TABLET | Freq: Every day | ORAL | Status: DC

## 2016-04-22 NOTE — Progress Notes (Signed)
Subjective: [name] feels like her breathing has improved to better than baseline today. She was up and walking around the halls yesterday with less difficulty breathing than she is used to experiencing when she is outside of the hospital. She did not feel like wearing the CPAP mask last night but has been wearing it for a few hours every night. She feels ready to go to SNF today.   Objective:  Vital signs in last 24 hours: Vitals:   04/22/16 0455 04/22/16 0847 04/22/16 0849 04/22/16 0851  BP: (!) 103/59     Pulse: 80     Resp: (!) 27     Temp: 98.4 F (36.9 C)     TempSrc: Oral     SpO2: 91% 90% 94% 100%  Weight: 254 lb 11.2 oz (115.5 kg)     Height:       Physical Exam  Constitutional: She appears well-developed and well-nourished. No distress.  Eyes: Conjunctivae are normal. No scleral icterus.  Cardiovascular: Normal rate and regular rhythm.   No murmur heard. No lower extremity edema  Pulmonary/Chest: Effort normal. No respiratory distress. She has no wheezes. She has rales.  Course breath sounds over left lower lung field   Abdominal: Soft. Bowel sounds are normal. She exhibits no distension. There is no tenderness.  Skin: She is not diaphoretic.  Psychiatric: She has a normal mood and affect. Her behavior is normal.    Assessment/Plan: Pt is a 61 y.o. yo female with a PMHx of COPD on home oxygen, pulmonary hypertension, stage IIa adenocarcinoma of the lung, diastolic dysfunction, s/p drug eluting stent, chronic venous insufficiency, and CKD. who was admitted on 04/15/2016 with symptoms of dyspnea without change in chronic cough, which was determined to be secondary to COPD exacerbation.   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 Breathing has responded well to IV diuresis. Will need repeat imaging in a week to evaluate for worsening of pleural effusion and may need pleurx cath placed in the future.  -continue duoneb q6h, brovana, pulmicort, and incruse  -goal SpO2 90-94%    -effusion fluid culture >> NGTD   COPD GOLD 3D Completed MOST form has been placed in her chart.   Chronic diastolic heart failure  Trial of IV diuresis has worked to improve her breathing. Crt increased to 2.0 from 1.6 where it had been stable, will D/c the IV lasix and put her back on her home dose po lasix 80 qd.  Net output 6.7 liters, weight 115 kg down from 122 at admission.  -resume home dose po lasix 80 mg qd -daily weights, strict I&Os   Anemia of chronic kidney disease  Normal ferritin, low TIBC and iron. Denies blood loss. Hgb 7.2, stable in the 9s s/p transfusion.  -vitamin B12 supplementation   Acute on chronic kidney disease  Crt increased to 2.0 with IV lasix, still improved from 2.6 on admission. Has been steadily  rising from baseline 0.8 in 05/2015.   - holding lisinopril, metformin   Non insulin dependent Type 2 DM  A1c 8/17 6.5. CBG 120-180 on moderate ISS. She was on prednisone for COPD exacerbation -continue CBG monitoring  -continue resistant ISS with HS coverage.   GERD  -continue home med pantoprazole 40 mg qd.   Constipation  -miralax  -senna   Dispo: Anticipated discharge today, pending SNF placement.  Ledell Noss, MD 04/22/2016, 11:05 AM Pager: [phone]

## 2016-04-22 NOTE — Clinical Social Work Note (Signed)
CSW facilitated patient discharge including contacting patient family and facility to confirm patient discharge plans. Clinical information faxed to facility and family agreeable with plan. CSW arranged ambulance transport via [facility] to [facility]. RN to call report prior to discharge [phone]).  CSW will sign off for now as social work intervention is no longer needed. Please consult [place] again if new needs arise.  [place] Scrape, [place]

## 2016-04-22 NOTE — Care Management Important Message (Signed)
Important Message  Patient Details  Name: [name] MRN: [id number] Date of Birth: 02-Dec-1955   Medicare Important Message Given:  Yes    Nathen May 04/22/2016, 5:13 PM

## 2016-04-22 NOTE — Progress Notes (Signed)
Physical Therapy Treatment Patient Details Name: [name] MRN: [id number] DOB: 02/05/1956 Today's Date: 04/22/2016    History of Present Illness Patient is a 61 y/o female with hx of stage 2 NSCLC, pulmonary HTN, CAD, CHF, COPD, prediabetes, HLD presents with SOB. 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    PT Comments    Patient progressing well towards PT goals but continues to demonstrate impaired cardiovascular endurance and decreased cardiorespiratory status with activity. Sp02 dropped to 76% on 6L/min 02 on first ambulation bout, 80% on second bout and 85% on third bout but takes a few minutes to recover with cues for pursed lip breathing. Will continue to follow.   Follow Up Recommendations  SNF     Equipment Recommendations  None recommended by PT    Recommendations for Other Services       Precautions / Restrictions Precautions Precautions: Fall Precaution Comments: watch 02 Restrictions Weight Bearing Restrictions: No    Mobility  Bed Mobility               General bed mobility comments: Sitting EOB upon PT arrival.  Transfers Overall transfer level: Needs assistance Equipment used: Rolling walker (2 wheeled) Transfers: Sit to/from Stand Sit to Stand: Supervision         General transfer comment: Supervision for safety. Stood from [employer], from chair x3.  Ambulation/Gait Ambulation/Gait assistance: Min guard Ambulation Distance (Feet): 60 Feet (x3 bouts) Assistive device: Rolling walker (2 wheeled) Gait Pattern/deviations: Step-through pattern;Trunk flexed Gait velocity: decreased Gait velocity interpretation: Below normal speed for age/gender General Gait Details:  slow but steady pattern, using rw, 6L02 with SpO2 76-90%. Seated rest taken sitting EOB between ambulation bouts. Took ~3 minutes for 02 to resolve to >88%.   Stairs            Wheelchair Mobility    Modified Rankin (Stroke Patients Only)       Balance Overall balance assessment: Needs assistance Sitting-balance support: Feet supported;No upper extremity supported Sitting balance-Leahy Scale: Good     Standing balance support: During functional activity Standing balance-Leahy Scale: Fair Standing balance comment: able to stand static without device.                    Cognition Arousal/Alertness: Awake/alert Behavior During Therapy: WFL for tasks assessed/performed Overall Cognitive Status: Within Functional Limits for tasks assessed                      Exercises      General Comments        Pertinent Vitals/Pain Pain Assessment: No/denies pain    Home Living                      Prior Function            PT Goals (current goals can now be found in the care plan section) Progress towards PT goals: Progressing toward goals    Frequency    Min 3X/week      PT Plan Current plan remains appropriate    Co-evaluation             End of Session Equipment Utilized During Treatment: Oxygen Activity Tolerance: Patient limited by fatigue  Patient left: in chair;with call bell/phone within reach     Time: 0857-0926 PT Time Calculation (min) (ACUTE ONLY): 29 min  Charges:  $Therapeutic Exercise: 23-37 mins                    G Codes:      Colletta Spillers A Ruthel Martine 04/22/2016, 9:33 AM  Wray Kearns, PT, DPT [phone]

## 2016-04-22 NOTE — Clinical Social Work Note (Addendum)
[facility] says [employer] told them they should have insurance authorization decision today. CSW has left a Chartered certified accountant over CM/CSW departments to discuss possible need for LOG in the event we do not get an answer from [facility] today. CSW has updated patient. She is agreeable to going home with home health if insurance denies the insurance authorization. Patient states that she lives with her 19 year old grandson and her daughter lives very close by. CSW updated RN, MD, and RNCM.  Dayton Scrape, [place] [phone]  2:36 pm 5-day LOG approved by medical director. Waiting on SNF to call CSW back.  Dayton Scrape, [place] [phone]  2:48 pm Received call from [place] at [facility]. She is working on the authorization and only needs to review the H&P but did not have it. CSW faxed H&P to her direct fax number.  Dayton Scrape, [place] [phone]  4:06 pm Insurance authorization obtained. Admissions coordinator will fax admissions paperwork to CSW for patient to complete.  Dayton Scrape, [place]

## 2016-04-22 NOTE — Progress Notes (Signed)
Internal Medicine Attending:   I saw and examined the patient. I reviewed the resident's note and I agree with the resident's findings and plan as documented in the resident's note. Weight is now down to 254, we have likely reached a limit to diuresis as her SCr and BUN has started to rise.  Symptomatically she reports she is doing much better with the extra diuresis. Her recurrent pleural effusions may be secondary to her heart failure.  Otherwise she is agreeable to discharge to SNF.  We will try to arrange this for today.  She will need a follow up CXR in about 1 week to evaluate her pleural effusion.

## 2016-04-22 NOTE — Clinical Social Work Placement (Signed)
   CLINICAL SOCIAL WORK PLACEMENT  NOTE  Date:  04/22/2016  Patient Details  Name: [name] MRN: [id number] Date of Birth: [birth year]  Clinical Social Work is seeking post-discharge placement for this patient at the [place] level of care (*CSW will initial, date and re-position this form in  chart as items are completed):  Yes   Patient/family provided with [place] Work Department's list of facilities offering this level of care within the geographic area requested by the patient (or if unable, by the patient's family).  Yes   Patient/family informed of their freedom to choose among providers that offer the needed level of care, that participate in Medicare, Medicaid or managed care program needed by the patient, have an available bed and are willing to accept the patient.  Yes   Patient/family informed of [facility]'s ownership interest in [facility] and [facility], as well as of the fact that they are under no obligation to receive care at these facilities.  PASRR submitted to EDS on       PASRR number received on       Existing PASRR number confirmed on 04/19/16     FL2 transmitted to all facilities in geographic area requested by pt/family on 04/19/16     FL2 transmitted to all facilities within larger geographic area on       Patient informed that his/her managed care company has contracts with or will negotiate with certain facilities, including the following:        Yes   Patient/family informed of bed offers received.  Patient chooses bed at [facility]     Physician recommends and patient chooses bed at      Patient to be transferred to [facility] on 04/22/16.  Patient to be transferred to facility by [facility]     Patient family notified on 04/22/16 of transfer.  Name of family member notified:        PHYSICIAN Please prepare prescriptions     Additional Comment:     _______________________________________________ Candie Chroman, LCSW 04/22/2016, 4:38 PM

## 2016-04-24 DIAGNOSIS — I5033 Acute on chronic diastolic (congestive) heart failure: Secondary | ICD-10-CM | POA: Diagnosis not present

## 2016-04-24 DIAGNOSIS — I10 Essential (primary) hypertension: Secondary | ICD-10-CM | POA: Diagnosis not present

## 2016-04-24 DIAGNOSIS — J90 Pleural effusion, not elsewhere classified: Secondary | ICD-10-CM | POA: Diagnosis not present

## 2016-04-24 DIAGNOSIS — N184 Chronic kidney disease, stage 4 (severe): Secondary | ICD-10-CM | POA: Diagnosis not present

## 2016-04-24 DIAGNOSIS — D6489 Other specified anemias: Secondary | ICD-10-CM | POA: Diagnosis not present

## 2016-04-24 DIAGNOSIS — I2510 Atherosclerotic heart disease of native coronary artery without angina pectoris: Secondary | ICD-10-CM | POA: Diagnosis not present

## 2016-04-24 DIAGNOSIS — J9600 Acute respiratory failure, unspecified whether with hypoxia or hypercapnia: Secondary | ICD-10-CM | POA: Diagnosis not present

## 2016-04-24 DIAGNOSIS — C3490 Malignant neoplasm of unspecified part of unspecified bronchus or lung: Secondary | ICD-10-CM | POA: Diagnosis not present

## 2016-04-24 DIAGNOSIS — J449 Chronic obstructive pulmonary disease, unspecified: Secondary | ICD-10-CM | POA: Diagnosis not present

## 2016-04-25 DIAGNOSIS — J9601 Acute respiratory failure with hypoxia: Secondary | ICD-10-CM | POA: Diagnosis not present

## 2016-04-25 DIAGNOSIS — C3492 Malignant neoplasm of unspecified part of left bronchus or lung: Secondary | ICD-10-CM | POA: Diagnosis not present

## 2016-04-25 DIAGNOSIS — I2510 Atherosclerotic heart disease of native coronary artery without angina pectoris: Secondary | ICD-10-CM | POA: Diagnosis not present

## 2016-04-25 DIAGNOSIS — J441 Chronic obstructive pulmonary disease with (acute) exacerbation: Secondary | ICD-10-CM | POA: Diagnosis not present

## 2016-05-01 DIAGNOSIS — I5032 Chronic diastolic (congestive) heart failure: Secondary | ICD-10-CM | POA: Diagnosis not present

## 2016-05-01 DIAGNOSIS — J9612 Chronic respiratory failure with hypercapnia: Secondary | ICD-10-CM | POA: Diagnosis not present

## 2016-05-01 DIAGNOSIS — J9611 Chronic respiratory failure with hypoxia: Secondary | ICD-10-CM | POA: Diagnosis not present

## 2016-05-01 DIAGNOSIS — N183 Chronic kidney disease, stage 3 (moderate): Secondary | ICD-10-CM | POA: Diagnosis not present

## 2016-05-01 DIAGNOSIS — D631 Anemia in chronic kidney disease: Secondary | ICD-10-CM | POA: Diagnosis not present

## 2016-05-01 DIAGNOSIS — E1151 Type 2 diabetes mellitus with diabetic peripheral angiopathy without gangrene: Secondary | ICD-10-CM | POA: Diagnosis not present

## 2016-05-01 DIAGNOSIS — C3492 Malignant neoplasm of unspecified part of left bronchus or lung: Secondary | ICD-10-CM | POA: Diagnosis not present

## 2016-05-01 DIAGNOSIS — M545 Low back pain: Secondary | ICD-10-CM | POA: Diagnosis not present

## 2016-05-01 DIAGNOSIS — J432 Centrilobular emphysema: Secondary | ICD-10-CM | POA: Diagnosis not present

## 2016-05-03 ENCOUNTER — Other Ambulatory Visit: Payer: Self-pay | Admitting: *Deleted

## 2016-05-03 NOTE — Patient Outreach (Signed)
Spoke with Lowella Fairy, SW at [facility], She states patient discharged home on 04/29/16.  Call to patient to discuss [facility] care management program services. Patient reports she had [facility] home [facility] in place at this time, they have nurse, PT/OT scheduled.  Patient denies any issues with medications.  Patient does need to make appointment for f/u with PCP, encouraged patient to go ahead and call for appointment and due to being in the hospital and skilled facility she should be able to get appointment quickly, she verbalized understanding.   Verified that patient has [facility] contact as she has been active in the past and that she has 24 hour nurse line number for reference.   Plan to sign off case.  Royetta Crochet. Laymond Purser, RN, BSN, [place] [phone]) Business Cell  [phone]) Toll Free Office

## 2016-05-06 DIAGNOSIS — M545 Low back pain: Secondary | ICD-10-CM | POA: Diagnosis not present

## 2016-05-06 DIAGNOSIS — N183 Chronic kidney disease, stage 3 (moderate): Secondary | ICD-10-CM | POA: Diagnosis not present

## 2016-05-06 DIAGNOSIS — J9611 Chronic respiratory failure with hypoxia: Secondary | ICD-10-CM | POA: Diagnosis not present

## 2016-05-06 DIAGNOSIS — E1151 Type 2 diabetes mellitus with diabetic peripheral angiopathy without gangrene: Secondary | ICD-10-CM | POA: Diagnosis not present

## 2016-05-06 DIAGNOSIS — D631 Anemia in chronic kidney disease: Secondary | ICD-10-CM | POA: Diagnosis not present

## 2016-05-06 DIAGNOSIS — C3492 Malignant neoplasm of unspecified part of left bronchus or lung: Secondary | ICD-10-CM | POA: Diagnosis not present

## 2016-05-06 DIAGNOSIS — J9612 Chronic respiratory failure with hypercapnia: Secondary | ICD-10-CM | POA: Diagnosis not present

## 2016-05-06 DIAGNOSIS — J432 Centrilobular emphysema: Secondary | ICD-10-CM | POA: Diagnosis not present

## 2016-05-06 DIAGNOSIS — I5032 Chronic diastolic (congestive) heart failure: Secondary | ICD-10-CM | POA: Diagnosis not present

## 2016-05-07 DIAGNOSIS — J9600 Acute respiratory failure, unspecified whether with hypoxia or hypercapnia: Secondary | ICD-10-CM | POA: Diagnosis not present

## 2016-05-07 DIAGNOSIS — R0902 Hypoxemia: Secondary | ICD-10-CM | POA: Diagnosis not present

## 2016-05-07 DIAGNOSIS — J449 Chronic obstructive pulmonary disease, unspecified: Secondary | ICD-10-CM | POA: Diagnosis not present

## 2016-05-10 ENCOUNTER — Telehealth: Payer: Self-pay

## 2016-05-10 DIAGNOSIS — N183 Chronic kidney disease, stage 3 (moderate): Secondary | ICD-10-CM | POA: Diagnosis not present

## 2016-05-10 DIAGNOSIS — E1151 Type 2 diabetes mellitus with diabetic peripheral angiopathy without gangrene: Secondary | ICD-10-CM | POA: Diagnosis not present

## 2016-05-10 DIAGNOSIS — J9611 Chronic respiratory failure with hypoxia: Secondary | ICD-10-CM | POA: Diagnosis not present

## 2016-05-10 DIAGNOSIS — D631 Anemia in chronic kidney disease: Secondary | ICD-10-CM | POA: Diagnosis not present

## 2016-05-10 DIAGNOSIS — J9612 Chronic respiratory failure with hypercapnia: Secondary | ICD-10-CM | POA: Diagnosis not present

## 2016-05-10 DIAGNOSIS — M545 Low back pain: Secondary | ICD-10-CM | POA: Diagnosis not present

## 2016-05-10 DIAGNOSIS — J432 Centrilobular emphysema: Secondary | ICD-10-CM | POA: Diagnosis not present

## 2016-05-10 DIAGNOSIS — C3492 Malignant neoplasm of unspecified part of left bronchus or lung: Secondary | ICD-10-CM | POA: Diagnosis not present

## 2016-05-10 DIAGNOSIS — I5032 Chronic diastolic (congestive) heart failure: Secondary | ICD-10-CM | POA: Diagnosis not present

## 2016-05-10 NOTE — Telephone Encounter (Signed)
I agree with the plan Bonnita Nasuti... If she has worsening pain or SOB she needs to be seen at the ED ASAP

## 2016-05-10 NOTE — Telephone Encounter (Signed)
Tor[facility] from [facility] requesting to speak with the nurse. She is in the patient home now. Please call back.

## 2016-05-10 NOTE — Telephone Encounter (Signed)
[facility] calls and states pt's lung fields bil rhonchi throughout, pt has O2 in place pt does not state that she is more short of breath.pt does state for 3 to 4 days she is having stabbing pain in her L side just above her waist, hurts almost constantly, at times she does have a little relief, laying on her R side makes it worse and any activity aggravates it. Denies any changes in urine- denies odor, burning , frequency, color. Denies falls or injuries.  appt [facility] 2/9 at 1015 per pt choice due to transportation scheduling. Ask to go to ED or call 911 if she gets worse or any distress, she is agreeable

## 2016-05-11 DIAGNOSIS — E1151 Type 2 diabetes mellitus with diabetic peripheral angiopathy without gangrene: Secondary | ICD-10-CM | POA: Diagnosis not present

## 2016-05-11 DIAGNOSIS — C3492 Malignant neoplasm of unspecified part of left bronchus or lung: Secondary | ICD-10-CM | POA: Diagnosis not present

## 2016-05-11 DIAGNOSIS — J9612 Chronic respiratory failure with hypercapnia: Secondary | ICD-10-CM | POA: Diagnosis not present

## 2016-05-11 DIAGNOSIS — D631 Anemia in chronic kidney disease: Secondary | ICD-10-CM | POA: Diagnosis not present

## 2016-05-11 DIAGNOSIS — J432 Centrilobular emphysema: Secondary | ICD-10-CM | POA: Diagnosis not present

## 2016-05-11 DIAGNOSIS — J9611 Chronic respiratory failure with hypoxia: Secondary | ICD-10-CM | POA: Diagnosis not present

## 2016-05-11 DIAGNOSIS — I5032 Chronic diastolic (congestive) heart failure: Secondary | ICD-10-CM | POA: Diagnosis not present

## 2016-05-11 DIAGNOSIS — M545 Low back pain: Secondary | ICD-10-CM | POA: Diagnosis not present

## 2016-05-11 DIAGNOSIS — N183 Chronic kidney disease, stage 3 (moderate): Secondary | ICD-10-CM | POA: Diagnosis not present

## 2016-05-12 ENCOUNTER — Telehealth: Payer: Self-pay | Admitting: Internal Medicine

## 2016-05-12 NOTE — Telephone Encounter (Signed)
Calling to confirm appt for 05/13/16 at 10:15

## 2016-05-13 ENCOUNTER — Encounter: Payer: Self-pay | Admitting: Internal Medicine

## 2016-05-13 ENCOUNTER — Ambulatory Visit: Payer: Commercial Managed Care - HMO

## 2016-05-13 DIAGNOSIS — J9611 Chronic respiratory failure with hypoxia: Secondary | ICD-10-CM | POA: Diagnosis not present

## 2016-05-13 DIAGNOSIS — M545 Low back pain: Secondary | ICD-10-CM | POA: Diagnosis not present

## 2016-05-13 DIAGNOSIS — N183 Chronic kidney disease, stage 3 (moderate): Secondary | ICD-10-CM | POA: Diagnosis not present

## 2016-05-13 DIAGNOSIS — C3492 Malignant neoplasm of unspecified part of left bronchus or lung: Secondary | ICD-10-CM | POA: Diagnosis not present

## 2016-05-13 DIAGNOSIS — D631 Anemia in chronic kidney disease: Secondary | ICD-10-CM | POA: Diagnosis not present

## 2016-05-13 DIAGNOSIS — J9612 Chronic respiratory failure with hypercapnia: Secondary | ICD-10-CM | POA: Diagnosis not present

## 2016-05-13 DIAGNOSIS — I5032 Chronic diastolic (congestive) heart failure: Secondary | ICD-10-CM | POA: Diagnosis not present

## 2016-05-13 DIAGNOSIS — E1151 Type 2 diabetes mellitus with diabetic peripheral angiopathy without gangrene: Secondary | ICD-10-CM | POA: Diagnosis not present

## 2016-05-13 DIAGNOSIS — J432 Centrilobular emphysema: Secondary | ICD-10-CM | POA: Diagnosis not present

## 2016-05-18 DIAGNOSIS — E1151 Type 2 diabetes mellitus with diabetic peripheral angiopathy without gangrene: Secondary | ICD-10-CM | POA: Diagnosis not present

## 2016-05-18 DIAGNOSIS — C3492 Malignant neoplasm of unspecified part of left bronchus or lung: Secondary | ICD-10-CM | POA: Diagnosis not present

## 2016-05-18 DIAGNOSIS — J432 Centrilobular emphysema: Secondary | ICD-10-CM | POA: Diagnosis not present

## 2016-05-18 DIAGNOSIS — J9611 Chronic respiratory failure with hypoxia: Secondary | ICD-10-CM | POA: Diagnosis not present

## 2016-05-18 DIAGNOSIS — N183 Chronic kidney disease, stage 3 (moderate): Secondary | ICD-10-CM | POA: Diagnosis not present

## 2016-05-18 DIAGNOSIS — D631 Anemia in chronic kidney disease: Secondary | ICD-10-CM | POA: Diagnosis not present

## 2016-05-18 DIAGNOSIS — I5032 Chronic diastolic (congestive) heart failure: Secondary | ICD-10-CM | POA: Diagnosis not present

## 2016-05-18 DIAGNOSIS — J9612 Chronic respiratory failure with hypercapnia: Secondary | ICD-10-CM | POA: Diagnosis not present

## 2016-05-18 DIAGNOSIS — M545 Low back pain: Secondary | ICD-10-CM | POA: Diagnosis not present

## 2016-05-19 DIAGNOSIS — J432 Centrilobular emphysema: Secondary | ICD-10-CM | POA: Diagnosis not present

## 2016-05-19 DIAGNOSIS — I5032 Chronic diastolic (congestive) heart failure: Secondary | ICD-10-CM | POA: Diagnosis not present

## 2016-05-19 DIAGNOSIS — M545 Low back pain: Secondary | ICD-10-CM | POA: Diagnosis not present

## 2016-05-19 DIAGNOSIS — J9611 Chronic respiratory failure with hypoxia: Secondary | ICD-10-CM | POA: Diagnosis not present

## 2016-05-19 DIAGNOSIS — N183 Chronic kidney disease, stage 3 (moderate): Secondary | ICD-10-CM | POA: Diagnosis not present

## 2016-05-19 DIAGNOSIS — D631 Anemia in chronic kidney disease: Secondary | ICD-10-CM | POA: Diagnosis not present

## 2016-05-19 DIAGNOSIS — E1151 Type 2 diabetes mellitus with diabetic peripheral angiopathy without gangrene: Secondary | ICD-10-CM | POA: Diagnosis not present

## 2016-05-19 DIAGNOSIS — J9612 Chronic respiratory failure with hypercapnia: Secondary | ICD-10-CM | POA: Diagnosis not present

## 2016-05-19 DIAGNOSIS — C3492 Malignant neoplasm of unspecified part of left bronchus or lung: Secondary | ICD-10-CM | POA: Diagnosis not present

## 2016-05-20 DIAGNOSIS — M545 Low back pain: Secondary | ICD-10-CM | POA: Diagnosis not present

## 2016-05-20 DIAGNOSIS — J432 Centrilobular emphysema: Secondary | ICD-10-CM | POA: Diagnosis not present

## 2016-05-20 DIAGNOSIS — N183 Chronic kidney disease, stage 3 (moderate): Secondary | ICD-10-CM | POA: Diagnosis not present

## 2016-05-20 DIAGNOSIS — I5032 Chronic diastolic (congestive) heart failure: Secondary | ICD-10-CM | POA: Diagnosis not present

## 2016-05-20 DIAGNOSIS — J9612 Chronic respiratory failure with hypercapnia: Secondary | ICD-10-CM | POA: Diagnosis not present

## 2016-05-20 DIAGNOSIS — C3492 Malignant neoplasm of unspecified part of left bronchus or lung: Secondary | ICD-10-CM | POA: Diagnosis not present

## 2016-05-20 DIAGNOSIS — J9611 Chronic respiratory failure with hypoxia: Secondary | ICD-10-CM | POA: Diagnosis not present

## 2016-05-20 DIAGNOSIS — D631 Anemia in chronic kidney disease: Secondary | ICD-10-CM | POA: Diagnosis not present

## 2016-05-20 DIAGNOSIS — E1151 Type 2 diabetes mellitus with diabetic peripheral angiopathy without gangrene: Secondary | ICD-10-CM | POA: Diagnosis not present

## 2016-05-24 DIAGNOSIS — C3492 Malignant neoplasm of unspecified part of left bronchus or lung: Secondary | ICD-10-CM | POA: Diagnosis not present

## 2016-05-24 DIAGNOSIS — D631 Anemia in chronic kidney disease: Secondary | ICD-10-CM | POA: Diagnosis not present

## 2016-05-24 DIAGNOSIS — J9611 Chronic respiratory failure with hypoxia: Secondary | ICD-10-CM | POA: Diagnosis not present

## 2016-05-24 DIAGNOSIS — I5032 Chronic diastolic (congestive) heart failure: Secondary | ICD-10-CM | POA: Diagnosis not present

## 2016-05-24 DIAGNOSIS — J432 Centrilobular emphysema: Secondary | ICD-10-CM | POA: Diagnosis not present

## 2016-05-24 DIAGNOSIS — M545 Low back pain: Secondary | ICD-10-CM | POA: Diagnosis not present

## 2016-05-24 DIAGNOSIS — N183 Chronic kidney disease, stage 3 (moderate): Secondary | ICD-10-CM | POA: Diagnosis not present

## 2016-05-24 DIAGNOSIS — J9612 Chronic respiratory failure with hypercapnia: Secondary | ICD-10-CM | POA: Diagnosis not present

## 2016-05-24 DIAGNOSIS — E1151 Type 2 diabetes mellitus with diabetic peripheral angiopathy without gangrene: Secondary | ICD-10-CM | POA: Diagnosis not present

## 2016-05-26 DIAGNOSIS — E1151 Type 2 diabetes mellitus with diabetic peripheral angiopathy without gangrene: Secondary | ICD-10-CM | POA: Diagnosis not present

## 2016-05-26 DIAGNOSIS — D631 Anemia in chronic kidney disease: Secondary | ICD-10-CM | POA: Diagnosis not present

## 2016-05-26 DIAGNOSIS — I5032 Chronic diastolic (congestive) heart failure: Secondary | ICD-10-CM | POA: Diagnosis not present

## 2016-05-26 DIAGNOSIS — C3492 Malignant neoplasm of unspecified part of left bronchus or lung: Secondary | ICD-10-CM | POA: Diagnosis not present

## 2016-05-26 DIAGNOSIS — N183 Chronic kidney disease, stage 3 (moderate): Secondary | ICD-10-CM | POA: Diagnosis not present

## 2016-05-26 DIAGNOSIS — J9612 Chronic respiratory failure with hypercapnia: Secondary | ICD-10-CM | POA: Diagnosis not present

## 2016-05-26 DIAGNOSIS — J9611 Chronic respiratory failure with hypoxia: Secondary | ICD-10-CM | POA: Diagnosis not present

## 2016-05-26 DIAGNOSIS — J432 Centrilobular emphysema: Secondary | ICD-10-CM | POA: Diagnosis not present

## 2016-05-26 DIAGNOSIS — M545 Low back pain: Secondary | ICD-10-CM | POA: Diagnosis not present

## 2016-05-30 ENCOUNTER — Telehealth: Payer: Self-pay | Admitting: Internal Medicine

## 2016-05-30 NOTE — Telephone Encounter (Signed)
Pt called to cxl lab/MD appt due to insurance issues. Pt will call back to r/s appt

## 2016-06-01 ENCOUNTER — Other Ambulatory Visit: Payer: Commercial Managed Care - HMO

## 2016-06-01 ENCOUNTER — Emergency Department (HOSPITAL_COMMUNITY): Payer: Medicare HMO

## 2016-06-01 ENCOUNTER — Encounter (HOSPITAL_COMMUNITY): Payer: Self-pay

## 2016-06-01 ENCOUNTER — Emergency Department (HOSPITAL_COMMUNITY)
Admission: EM | Admit: 2016-06-01 | Discharge: 2016-06-01 | Disposition: A | Discharge status: Home / Self Care | Payer: Medicare HMO | Attending: Emergency Medicine | Admitting: Emergency Medicine

## 2016-06-01 DIAGNOSIS — Y939 Activity, unspecified: Secondary | ICD-10-CM | POA: Insufficient documentation

## 2016-06-01 DIAGNOSIS — E875 Hyperkalemia: Secondary | ICD-10-CM

## 2016-06-01 DIAGNOSIS — R55 Syncope and collapse: Secondary | ICD-10-CM | POA: Diagnosis present

## 2016-06-01 DIAGNOSIS — Z79899 Other long term (current) drug therapy: Secondary | ICD-10-CM | POA: Diagnosis not present

## 2016-06-01 DIAGNOSIS — Z7984 Long term (current) use of oral hypoglycemic drugs: Secondary | ICD-10-CM | POA: Diagnosis not present

## 2016-06-01 DIAGNOSIS — R0781 Pleurodynia: Secondary | ICD-10-CM | POA: Diagnosis not present

## 2016-06-01 DIAGNOSIS — N183 Chronic kidney disease, stage 3 (moderate): Secondary | ICD-10-CM | POA: Insufficient documentation

## 2016-06-01 DIAGNOSIS — I5032 Chronic diastolic (congestive) heart failure: Secondary | ICD-10-CM | POA: Insufficient documentation

## 2016-06-01 DIAGNOSIS — Y929 Unspecified place or not applicable: Secondary | ICD-10-CM | POA: Diagnosis not present

## 2016-06-01 DIAGNOSIS — I2510 Atherosclerotic heart disease of native coronary artery without angina pectoris: Secondary | ICD-10-CM | POA: Insufficient documentation

## 2016-06-01 DIAGNOSIS — E1122 Type 2 diabetes mellitus with diabetic chronic kidney disease: Secondary | ICD-10-CM | POA: Diagnosis not present

## 2016-06-01 DIAGNOSIS — Y999 Unspecified external cause status: Secondary | ICD-10-CM | POA: Diagnosis not present

## 2016-06-01 DIAGNOSIS — R531 Weakness: Secondary | ICD-10-CM | POA: Diagnosis not present

## 2016-06-01 DIAGNOSIS — J449 Chronic obstructive pulmonary disease, unspecified: Secondary | ICD-10-CM | POA: Insufficient documentation

## 2016-06-01 DIAGNOSIS — I130 Hypertensive heart and chronic kidney disease with heart failure and stage 1 through stage 4 chronic kidney disease, or unspecified chronic kidney disease: Secondary | ICD-10-CM | POA: Diagnosis not present

## 2016-06-01 DIAGNOSIS — Z85118 Personal history of other malignant neoplasm of bronchus and lung: Secondary | ICD-10-CM | POA: Insufficient documentation

## 2016-06-01 DIAGNOSIS — Z87891 Personal history of nicotine dependence: Secondary | ICD-10-CM | POA: Diagnosis not present

## 2016-06-01 DIAGNOSIS — R52 Pain, unspecified: Secondary | ICD-10-CM | POA: Diagnosis not present

## 2016-06-01 DIAGNOSIS — Z7982 Long term (current) use of aspirin: Secondary | ICD-10-CM | POA: Diagnosis not present

## 2016-06-01 DIAGNOSIS — W19XXXA Unspecified fall, initial encounter: Secondary | ICD-10-CM

## 2016-06-01 DIAGNOSIS — R0789 Other chest pain: Secondary | ICD-10-CM | POA: Diagnosis not present

## 2016-06-01 DIAGNOSIS — N289 Disorder of kidney and ureter, unspecified: Secondary | ICD-10-CM | POA: Diagnosis not present

## 2016-06-01 DIAGNOSIS — S299XXA Unspecified injury of thorax, initial encounter: Secondary | ICD-10-CM | POA: Diagnosis not present

## 2016-06-01 DIAGNOSIS — W1830XA Fall on same level, unspecified, initial encounter: Secondary | ICD-10-CM | POA: Insufficient documentation

## 2016-06-01 DIAGNOSIS — M549 Dorsalgia, unspecified: Secondary | ICD-10-CM | POA: Diagnosis not present

## 2016-06-01 LAB — CBC
HEMATOCRIT: 29.2 % — AB (ref 36.0–46.0)
Hemoglobin: 9.3 g/dL — ABNORMAL LOW (ref 12.0–15.0)
MCH: 29.5 pg (ref 26.0–34.0)
MCHC: 31.8 g/dL (ref 30.0–36.0)
MCV: 92.7 fL (ref 78.0–100.0)
Platelets: 239 10*3/uL (ref 150–400)
RBC: 3.15 MIL/uL — ABNORMAL LOW (ref 3.87–5.11)
RDW: 14.1 % (ref 11.5–15.5)
WBC: 11.8 10*3/uL — AB (ref 4.0–10.5)

## 2016-06-01 LAB — CBG MONITORING, ED: Glucose-Capillary: 128 mg/dL — ABNORMAL HIGH (ref 65–99)

## 2016-06-01 LAB — BASIC METABOLIC PANEL
Anion gap: 10 (ref 5–15)
BUN: 38 mg/dL — AB (ref 6–20)
CHLORIDE: 96 mmol/L — AB (ref 101–111)
CO2: 29 mmol/L (ref 22–32)
Calcium: 9.5 mg/dL (ref 8.9–10.3)
Creatinine, Ser: 3.39 mg/dL — ABNORMAL HIGH (ref 0.44–1.00)
GFR calc Af Amer: 16 mL/min — ABNORMAL LOW (ref >60)
GFR calc non Af Amer: 14 mL/min — ABNORMAL LOW (ref >60)
GLUCOSE: 123 mg/dL — AB (ref 65–99)
POTASSIUM: 5.4 mmol/L — AB (ref 3.5–5.1)
Sodium: 135 mmol/L (ref 135–145)

## 2016-06-01 MED ORDER — HYDROCODONE-ACETAMINOPHEN 5-325 MG PO TABS
1.0000 | ORAL_TABLET | Freq: Once | ORAL | Status: AC
  Administered 2016-06-01: 1 via ORAL
  Filled 2016-06-01: qty 1

## 2016-06-01 MED ORDER — HYDROCODONE-ACETAMINOPHEN 5-325 MG PO TABS: 1.0000 | ORAL_TABLET | Freq: Four times a day (QID) | ORAL | 0 refills | Status: DC | PRN

## 2016-06-01 NOTE — ED Triage Notes (Signed)
Pt states she fell over a week ago. Pt states she does not know why she fell. " I believe I passed out". Pt woke up on the floor. Home 02 4LNC. Pt did not seek treatment at that time. Pt here for left side pain. Pt is pointing at her left rib section and going around her back

## 2016-06-01 NOTE — ED Notes (Signed)
Pt said she went to the bathroom and did not know a specimen was needed

## 2016-06-01 NOTE — ED Notes (Signed)
Pt admitted to [facility] for pneumonia, transfusion of blood. Sent to nursing home for [place] home 05/23/16. Fell 1 week ago.

## 2016-06-01 NOTE — ED Notes (Signed)
Patient verbalizes understanding of discharge instructions, home care and follow up care. Patient out of department via [facility] at this time.

## 2016-06-01 NOTE — ED Triage Notes (Addendum)
Pt comes from home via EMS with complaints of pain over the left rib cage from a fall that happened approximately one week ago.  A&O x4.  Ambulatory normally but pain is restricting her mobility. Chronic oxygen use of 4L [place].  Hx of COPD.  No complaints regarding that at this time. Vitals WNL. Was in the hospital recently for pneumonia.

## 2016-06-01 NOTE — ED Provider Notes (Addendum)
[name] DEPT Provider Note   CSN: [id number] Arrival date & time: 06/01/16  1727     History   Chief Complaint Chief Complaint  Patient presents with  . Back Pain  . Fall  . Loss of Consciousness    HPI [name] is a 61 y.o. female.  Patient status post a fall a week ago landing on her left side. Patient since that fall has had pain there quickly pain with movement seems to be along the lower left anterior and lateral chest does radiate around to the back. No abdominal pain no nausea vomiting or diarrhea. No other complaints. Patient recently out of rehabilitation following complications of pneumonia. Then the left-sided pleural effusion which was drained. But she never had any pain with that. This pain definitely started right after the fall. In addition patients not sure if she passed out but the fall was a week ago.  Patient normally on oxygen at all 4 L is her base. No need to increase that recently.      Past Medical History:  Diagnosis Date  . Arthritis    "some scattered" (03/03/2015)  . CHF (congestive heart failure) ([place])   . Chronic bronchitis ([place])    "get it most years" (03/03/2015)  . COPD (chronic obstructive pulmonary disease) (HCC)    Severe. Gold Stage IV.  PFTs (12/2008) - severe obstructive airway disease. Active tobacco use. Requires 4L O2 at home.  . Coronary artery disease    S/P PCI of LAD with DES (12/2008). Total occlusion of RCA noted at that time., medically managed. ACS ruled out 03/2009 with Lexiscan myoview . Followed by [facility].  . Depression   . Diastolic dysfunction    2-D Echo (12/2008) - Normal LV Systolic funciton with EF 60-65%. Grade 1 diastolid dysfunction. No regional wall motion abnormalities. Moderate pulmonary HTN with PA peak pressure 45mHg.  . Full dentures   . GERD (gastroesophageal reflux disease)    S/P Nissen fundoplication.  .Marland KitchenHistory of hiatal hernia   . Hx MRSA infection    Recurrent MRSA thigh abscesses.   . Hyperlipidemia   . Lung cancer ([place]    "[place]"  . Obesity   . On home oxygen therapy since 2010   4L all the time  . Pneumonia   . Prediabetes    HgbA1c 6.4 (12/2008)  . Pulmonary hypertension    2-D Echo (076/1950 - Systolic pressure was moderately increased. PA peak pressure  578mg. 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 Followed by Dr. WrJoya GaskinsLVelora Heckler . Pulmonary nodule, right    Small right middle lobe nodule. Stable as of 12/2008.  . Marland Kitchenhortness of breath dyspnea    with a lot of exertion; if fluid builds up    Patient Active Problem List   Diagnosis Date Noted  . Recurrent left pleural effusion   . Acute on chronic respiratory failure with hypoxia and hypercapnia (HCC)   . Centrilobular emphysema ([place]  . Dyspnea on exertion   . Acute respiratory failure (HCPoint Place01/03/2017  . Community acquired pneumonia 04/15/2016  . Constipation   . Anemia, normocytic normochromic   . History of adenocarcinoma of lung   . Vitamin D deficiency 02/15/2016  . Oral candidiasis 02/15/2016  . Bronchitis, chronic obstructive w acute bronchitis (HCLinn11/06/2015  . Elevated alkaline phosphatase level 02/05/2016  . Pleural effusion, left 02/05/2016  . Acute kidney  injury ([place]) 02/04/2016  . CKD (chronic kidney disease) 02/04/2016  . Benign paroxysmal positional vertigo 09/10/2015  . Subjective tinnitus 09/10/2015  . Chronic venous insufficiency   . COPD with exacerbation ([place]) 04/10/2015  . Vitamin B12 deficiency neuropathy ([place]) 12/21/2014  . Primary adenocarcinoma of left lung ([place]) 12/04/2014  . Acute on chronic respiratory failure ([place]) 07/01/2013  . Healthcare maintenance 02/06/2013  . Chronic diastolic heart failure ([place]) 11/05/2012  . Hyperlipidemia   . Financial difficulties 06/15/2010  . Pulmonary hypertension 01/05/2009  . Obesity, Class II, BMI 35-39.9 12/31/2008  .  Sleep apnea 12/31/2008  . Tobacco abuse 12/22/2008  . 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 Severe chronic obstructive pulmonary disease ([place]) 12/22/2008  . Gastroesophageal reflux disease 12/22/2008  . 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 ([place]) 12/22/2008    Past Surgical History:  Procedure Laterality Date  . ABDOMINAL HYSTERECTOMY  1980's   "still have my cervix"  . APPENDECTOMY    . BACK SURGERY    . BILATERAL OOPHORECTOMY  1979  . BREAST LUMPECTOMY Right 1990's    (biopsy negative)  . CORONARY ANGIOPLASTY WITH STENT PLACEMENT  2010   [place]; RCA  . HERNIA REPAIR    . KNEE ARTHROSCOPY Right ~ 2000  . [place] SURGERY  2001   "herniated discs; both by [facility], Dr. Lorin Mercy"  . NISSEN FUNDOPLICATION    . RIGHT HEART CATHETERIZATION N/A 11/09/2012   Procedure: RIGHT HEART CATH;  Surgeon: Larey Dresser, MD;  Location: [facility] CATH LAB;  Service: Cardiovascular;  Laterality: N/A;  . TONSILLECTOMY    . TUBAL LIGATION  1979  . VIDEO BRONCHOSCOPY WITH ENDOBRONCHIAL ULTRASOUND N/A 12/15/2014   Procedure: VIDEO BRONCHOSCOPY WITH ENDOBRONCHIAL ULTRASOUND;  Surgeon: Ivin Poot, MD;  Location: [facility] OR;  Service: Thoracic;  Laterality: N/A;    OB History    No data available       Home Medications    Prior to Admission medications   Medication Sig Start Date End Date Taking? Authorizing Provider  albuterol (VENTOLIN HFA) 108 (90 Base) MCG/ACT inhaler Inhale 1-2 puffs into the lungs every 6 (six) hours as needed for shortness of breath. 02/03/16  Yes Oval Linsey, MD  arformoterol (BROVANA) 15 MCG/2ML NEBU Take 2 mLs (15 mcg total) by nebulization 2 (two) times daily. 04/21/16  Yes Ledell Noss, MD  aspirin 81 MG chewable tablet Chew 1 tablet (81 mg total) by mouth at bedtime. 01/16/15  Yes Oval Linsey, MD  budesonide (PULMICORT) 0.5 MG/2ML nebulizer solution Take 2 mLs (0.5 mg total) by nebulization 2 (two) times  daily. 04/21/16  Yes Ledell Noss, MD  buPROPion [facility] SR) 150 MG 12 hr tablet Take 1 tablet (150 mg total) by mouth 2 (two) times daily. 02/15/16  Yes Oval Linsey, MD  furosemide (LASIX) 40 MG tablet Take 80 mg by mouth daily.   Yes Historical Provider, MD  gabapentin (NEURONTIN) 300 MG capsule Take 1 capsule (300 mg total) by mouth 3 (three) times daily. Patient taking differently: Take 300 mg by mouth at bedtime.  02/03/16  Yes Oval Linsey, MD  lisinopril (PRINIVIL,ZESTRIL) 5 MG tablet Take 5 mg by mouth at bedtime.   Yes Historical Provider, MD  metFORMIN (GLUCOPHAGE) 500 MG tablet Take 1 tablet (500 mg total) by mouth 2 (two) times daily with a meal. 09/10/15  Yes Oval Linsey, MD  metolazone (ZAROXOLYN) 2.5 MG tablet TAKE 1 TABLET BY MOUTH AS NEEDED(FOR 5 LBS  WEIGHT GAIN) 05/13/15  Yes [name] Friar, PA-C  pantoprazole (PROTONIX) 40 MG tablet Take 1 tablet (40 mg total) by mouth daily. 11/05/15  Yes Oval Linsey, MD  potassium chloride SA (K-DUR,KLOR-CON) 20 MEQ tablet Take 1 tablet (20 mEq total) by mouth daily. 11/30/15  Yes Oval Linsey, MD  rosuvastatin (CRESTOR) 20 MG tablet Take 1 tablet (20 mg total) by mouth daily. 09/10/15  Yes Oval Linsey, MD  umeclidinium bromide (INCRUSE ELLIPTA) 62.5 MCG/INH AEPB Inhale 1 puff into the lungs daily. 04/21/16  Yes Ledell Noss, MD  vitamin B-12 (CYANOCOBALAMIN) 1000 MCG tablet Take 1 tablet (1,000 mcg total) by mouth daily. 09/10/15  Yes Oval Linsey, MD  HYDROcodone-acetaminophen (NORCO/VICODIN) 5-325 MG tablet Take 1-2 tablets by mouth every 6 (six) hours as needed for moderate pain. 06/01/16   Fredia Sorrow, MD    Family History Family History  Problem Relation Age of Onset  . Heart disease Mother 59    Deceased from MI at 10yo  . Hypertension Mother   . Heart disease Father 60    Deceased of MI age 4yo  . Hypertension Father   . Hypertension Brother   . Lung cancer      Grandmother    Social History Social History   Substance Use Topics  . Smoking status: Former Smoker    Packs/day: 1.00    Years: 45.00    Types: Cigarettes    Start date: 04/04/1969    Quit date: 11/04/2015  . Smokeless tobacco: Never Used     Comment: 1 cig per day  . Alcohol use No     Allergies   Fluconazole and Atorvastatin   Review of Systems Review of Systems  Constitutional: Negative for fever.  HENT: Negative for congestion.   Eyes: Negative for visual disturbance.  Respiratory: Negative for shortness of breath.   Cardiovascular: Positive for chest pain.  Gastrointestinal: Negative for abdominal pain, nausea and vomiting.  Genitourinary: Negative for dysuria.  Musculoskeletal: Positive for back pain.  Skin: Negative for wound.  Neurological: Positive for syncope. Negative for headaches.  Hematological: Does not bruise/bleed easily.  Psychiatric/Behavioral: Negative for confusion.     Physical Exam Updated Vital Signs BP 107/56 (BP Location: Right Arm)   Pulse 86   Temp 98.4 F (36.9 C) (Oral)   Resp 18   Ht _0  (1.651 m)   Wt 117.9 kg   SpO2 96%   BMI 43.27 kg/m   Physical Exam  Constitutional: She is oriented to person, place, and time. She appears well-developed and well-nourished. No distress.  HENT:  Head: Atraumatic.  Mouth/Throat: Oropharynx is clear and moist.  Eyes: EOM are normal. Pupils are equal, round, and reactive to light.  Neck: Normal range of motion. Neck supple.  Cardiovascular: Normal rate, regular rhythm and normal heart sounds.   Pulmonary/Chest: Effort normal and breath sounds normal. She exhibits tenderness.  Tenderness to palpation left lateral lower chest wall.  Abdominal: Soft. Bowel sounds are normal. There is no tenderness.  No left upper quadrant abdominal tenderness.  Musculoskeletal: Normal range of motion.  Neurological: She is alert and oriented to person, place, and time.  Nursing note and vitals reviewed.    ED Treatments / Results  Labs (all labs  ordered are listed, but only abnormal results are displayed) Labs Reviewed  BASIC METABOLIC PANEL - Abnormal; Notable for the following:       Result Value   Potassium 5.4 (*)    Chloride 96 (*)  Glucose, Bld 123 (*)    BUN 38 (*)    Creatinine, Ser 3.39 (*)    GFR calc non Af Amer 14 (*)    GFR calc Af Amer 16 (*)    All other components within normal limits  CBC - Abnormal; Notable for the following:    WBC 11.8 (*)    RBC 3.15 (*)    Hemoglobin 9.3 (*)    HCT 29.2 (*)    All other components within normal limits  CBG MONITORING, ED - Abnormal; Notable for the following:    Glucose-Capillary 128 (*)    All other components within normal limits  URINALYSIS, ROUTINE W REFLEX MICROSCOPIC    EKG  EKG Interpretation  Date/Time:  Wednesday June 01 2016 18:12:30 EST Ventricular Rate:  86 PR Interval:    QRS Duration: 96 QT Interval:  356 QTC Calculation: 426 R Axis:   90 Text Interpretation:  Sinus rhythm Borderline right axis deviation Low voltage, precordial leads RSR' in V1 or V2, probably normal variant Baseline wander in lead(s) II III aVF No significant change since last tracing Confirmed by Jerrine Urschel  MD, Kashay Cavenaugh [phone]) on 06/01/2016 6:18:24 PM       Radiology Dg Ribs Unilateral W/chest Left  Result Date: 06/01/2016 CLINICAL DATA:  Fall 1 week ago. Left lower axillary and anterior rib pain. History of left-sided lung cancer. EXAM: LEFT RIBS AND CHEST - 3+ VIEW COMPARISON:  Chest x-ray 04/21/2016. FINDINGS: Lungs are hyperexpanded. The cardio pericardial silhouette is enlarged. Interstitial markings are diffusely coarsened with chronic features. Opacity at the medial right base is compatible with fat pad as seen on CT from 11/25/2015. The left base collapse/ consolidation with small to moderate left pleural effusion is stable. No evidence for pneumothorax. Oblique views of the left ribs were obtained. Radio-opaque marker has been placed on the skin at the site of  patient concern. No evidence for left-sided rib fracture. No expansile or aggressive rib lesion identified in this patient with a reported history of left lung cancer. IMPRESSION: 1. Stable left base collapse/consolidation with small to moderate left pleural effusion. 2. Opacity in medial right lung base compatible with fat pad as seen on previous CT. 3. No evidence for left-sided rib fracture or destructive rib lesion. Electronically Signed   By: Misty Stanley M.D.   On: 06/01/2016 19:11    Procedures Procedures (including critical care time)  Medications Ordered in ED Medications  HYDROcodone-acetaminophen (NORCO/VICODIN) 5-325 MG per tablet 1 tablet (not administered)     Initial Impression / Assessment and Plan / ED Course  I have reviewed the triage vital signs and the nursing notes.  Pertinent labs & imaging results that were available during my care of the patient were reviewed by me and considered in my medical decision making (see chart for details).    Workup here without evidence of any rib fracture. But clinically patient definitely has either bruised or cracked ribs on the left side also could be injury to the cartilage in that area. Will treat with pain medicine for this. Chest x-ray shows that the pleural effusion that she's had in the past is stable there. Patient is aware of this that never caused her any pain. Patient started experiencing pain in this after the fall a week ago when she landed on that side suspect this is due to injury. Patient's oxygen saturations are fine.  Patient normally on 4 L of oxygen all the time as stated above oxygen saturations are  fine on that.  The patient's hemoglobin is stable compared to her previous hemoglobins. She does have an anemia.  Labs showed some abnormalities. Potassium was elevated at 5.4. Creatinine is increased to 3.39. Patient known to have renal insufficiency. Patient was on potassium supplements so we will have her stop those.  She will follow-up with her doctor in the next week or 2 for rechecks.  In addition it may have been a single episode when she had the fall a week ago. EKG here today without any acute findings cardiac monitoring without any arrhythmias. Since there hasn't been any recent syncopal episodes of admission not required.   Final Clinical Impressions(s) / ED Diagnoses   Final diagnoses:  Chest wall pain  Fall, initial encounter  Hyperkalemia  Renal insufficiency    New Prescriptions New Prescriptions   HYDROCODONE-ACETAMINOPHEN (NORCO/VICODIN) 5-325 MG TABLET    Take 1-2 tablets by mouth every 6 (six) hours as needed for moderate pain.     Fredia Sorrow, MD 06/01/16 2020    Fredia Sorrow, MD 06/01/16 2023

## 2016-06-01 NOTE — Discharge Instructions (Signed)
Chest x-ray with rib series without obvious rib fractures but clinically still suspicious about the bruised ribs or cracked cartilage in the area. Will treat as if it's a rib fracture. Take the hydrocodone as needed and as directed. Return for any new or worse symptoms. As we discussed her potassium level is high so stopped her oral potassium. Make arrangements to follow-up per week check of the potassium and kidney function sometime in the next 1-2 weeks with your primary care doctor.

## 2016-06-01 NOTE — ED Notes (Signed)
[facility] X-RAY CALLED TO CANCEL CHEST 2 VIEW. EDP WILL PLACE XRAY WITH CONCENTRATION ON LEFT SIDE FOCUS RIB FRACTURE

## 2016-06-01 NOTE — ED Notes (Signed)
ED Provider at bedside. 

## 2016-06-02 ENCOUNTER — Other Ambulatory Visit: Payer: Self-pay | Admitting: Internal Medicine

## 2016-06-02 DIAGNOSIS — Z720 Tobacco use: Secondary | ICD-10-CM

## 2016-06-04 DIAGNOSIS — J449 Chronic obstructive pulmonary disease, unspecified: Secondary | ICD-10-CM | POA: Diagnosis not present

## 2016-06-04 DIAGNOSIS — J9600 Acute respiratory failure, unspecified whether with hypoxia or hypercapnia: Secondary | ICD-10-CM | POA: Diagnosis not present

## 2016-06-04 DIAGNOSIS — R0902 Hypoxemia: Secondary | ICD-10-CM | POA: Diagnosis not present

## 2016-06-08 ENCOUNTER — Ambulatory Visit: Payer: Commercial Managed Care - HMO | Admitting: Internal Medicine

## 2016-06-10 ENCOUNTER — Other Ambulatory Visit: Payer: Self-pay

## 2016-06-10 DIAGNOSIS — J449 Chronic obstructive pulmonary disease, unspecified: Secondary | ICD-10-CM

## 2016-06-10 MED ORDER — BUDESONIDE 0.5 MG/2ML IN SUSP: 0.5000 mg | Freq: Two times a day (BID) | RESPIRATORY_TRACT | 3 refills | Status: DC

## 2016-06-10 MED ORDER — UMECLIDINIUM BROMIDE 62.5 MCG/INH IN AEPB: 1.0000 | INHALATION_SPRAY | Freq: Every day | RESPIRATORY_TRACT | 3 refills | Status: DC

## 2016-06-10 MED ORDER — ARFORMOTEROL TARTRATE 15 MCG/2ML IN NEBU: 15.0000 ug | INHALATION_SOLUTION | Freq: Two times a day (BID) | RESPIRATORY_TRACT | 3 refills | Status: DC

## 2016-06-10 NOTE — Telephone Encounter (Signed)
arformoterol (BROVANA) 15 MCG/2ML NEBU, budesonide (PULMICORT) 0.5 MG/2ML nebulizer solution, umeclidinium bromide (INCRUSE ELLIPTA) 62.5 MCG/INH AEPB, refill request @ [facility] on [address].

## 2016-07-01 IMAGING — CR DG CHEST 2V
2 series · 2 of 2 positions shown · non-contrast
Comparison: 08/19/2013

CLINICAL DATA: Shortness of breath, chest tightness, history COPD,
pulmonary hypertension, coronary artery disease, smoking, CHF

EXAM:
CHEST  2 VIEW

[w chest pa]
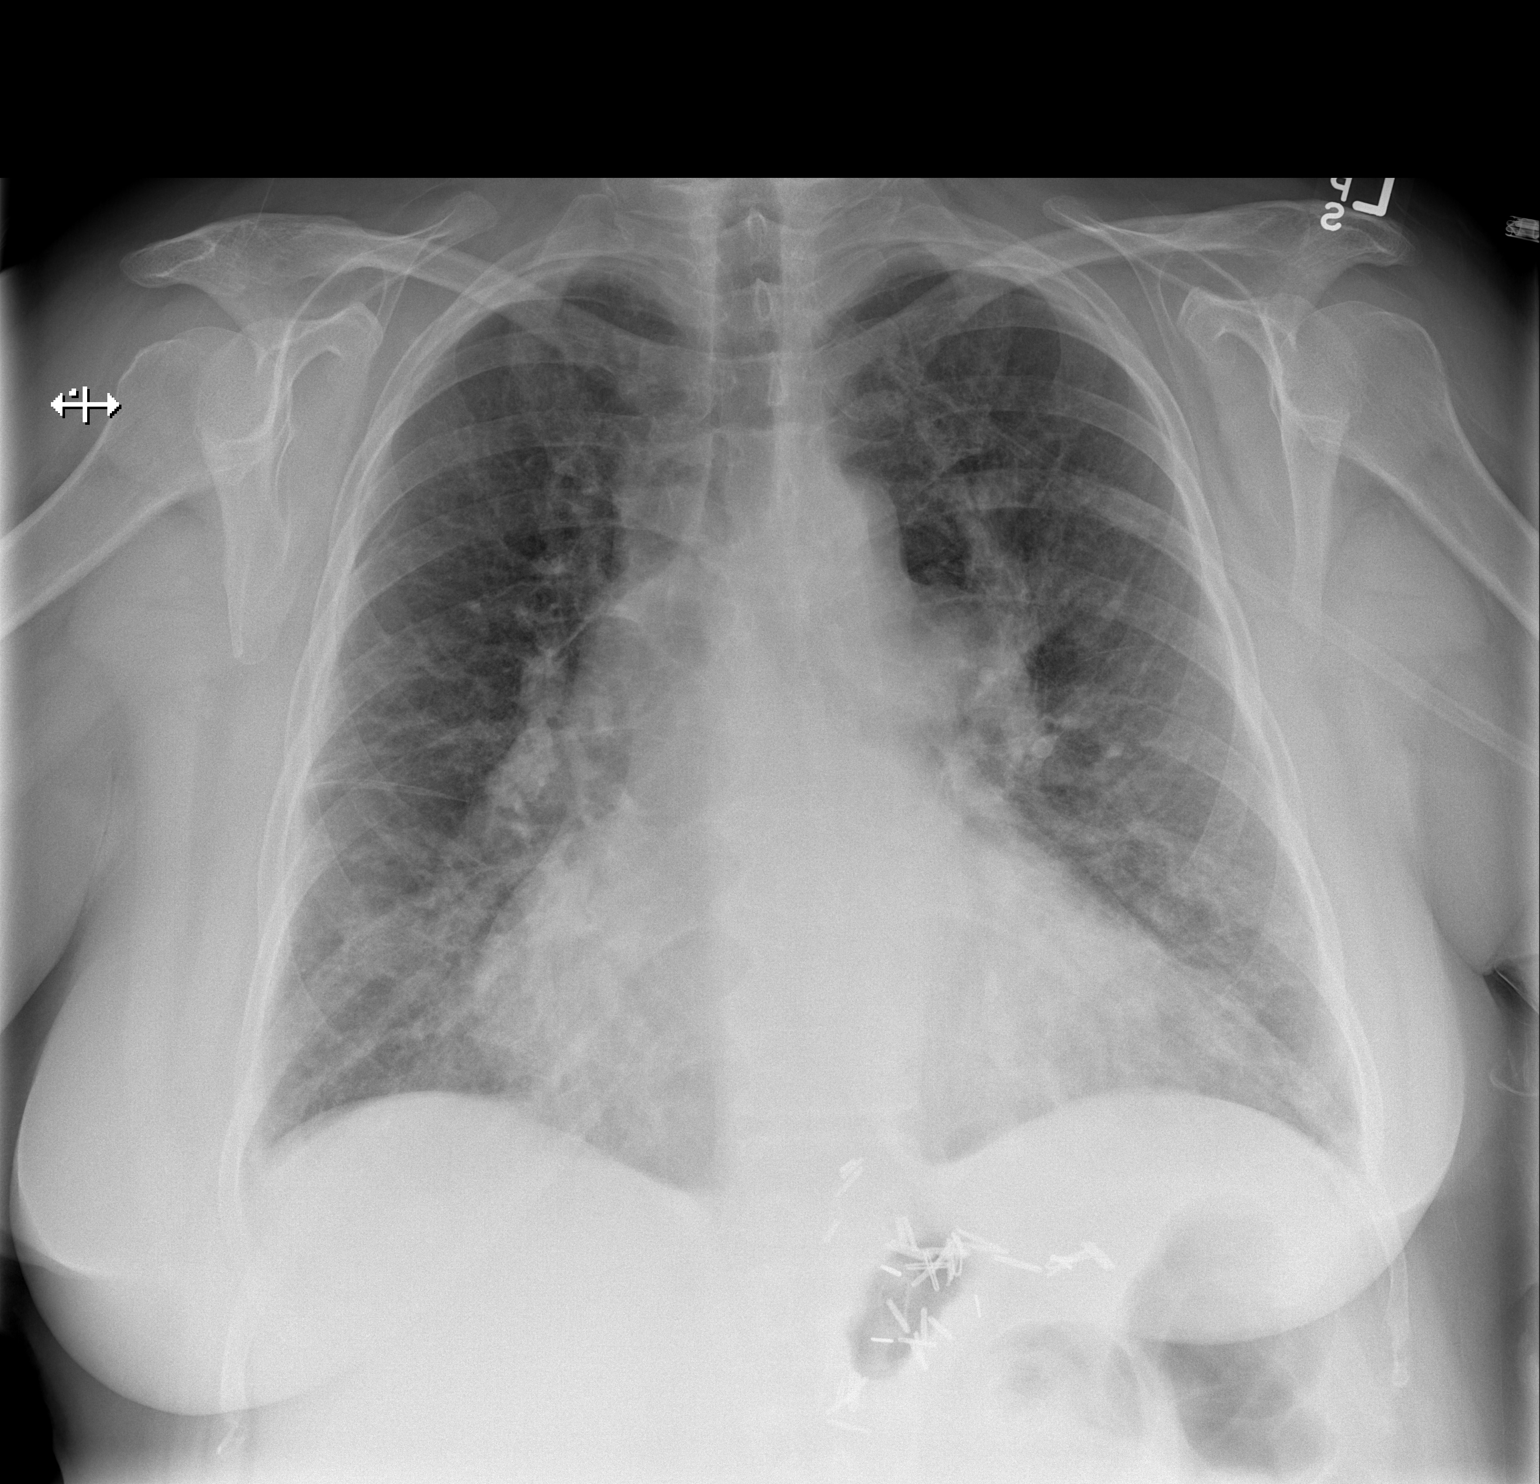

[w chest lat]
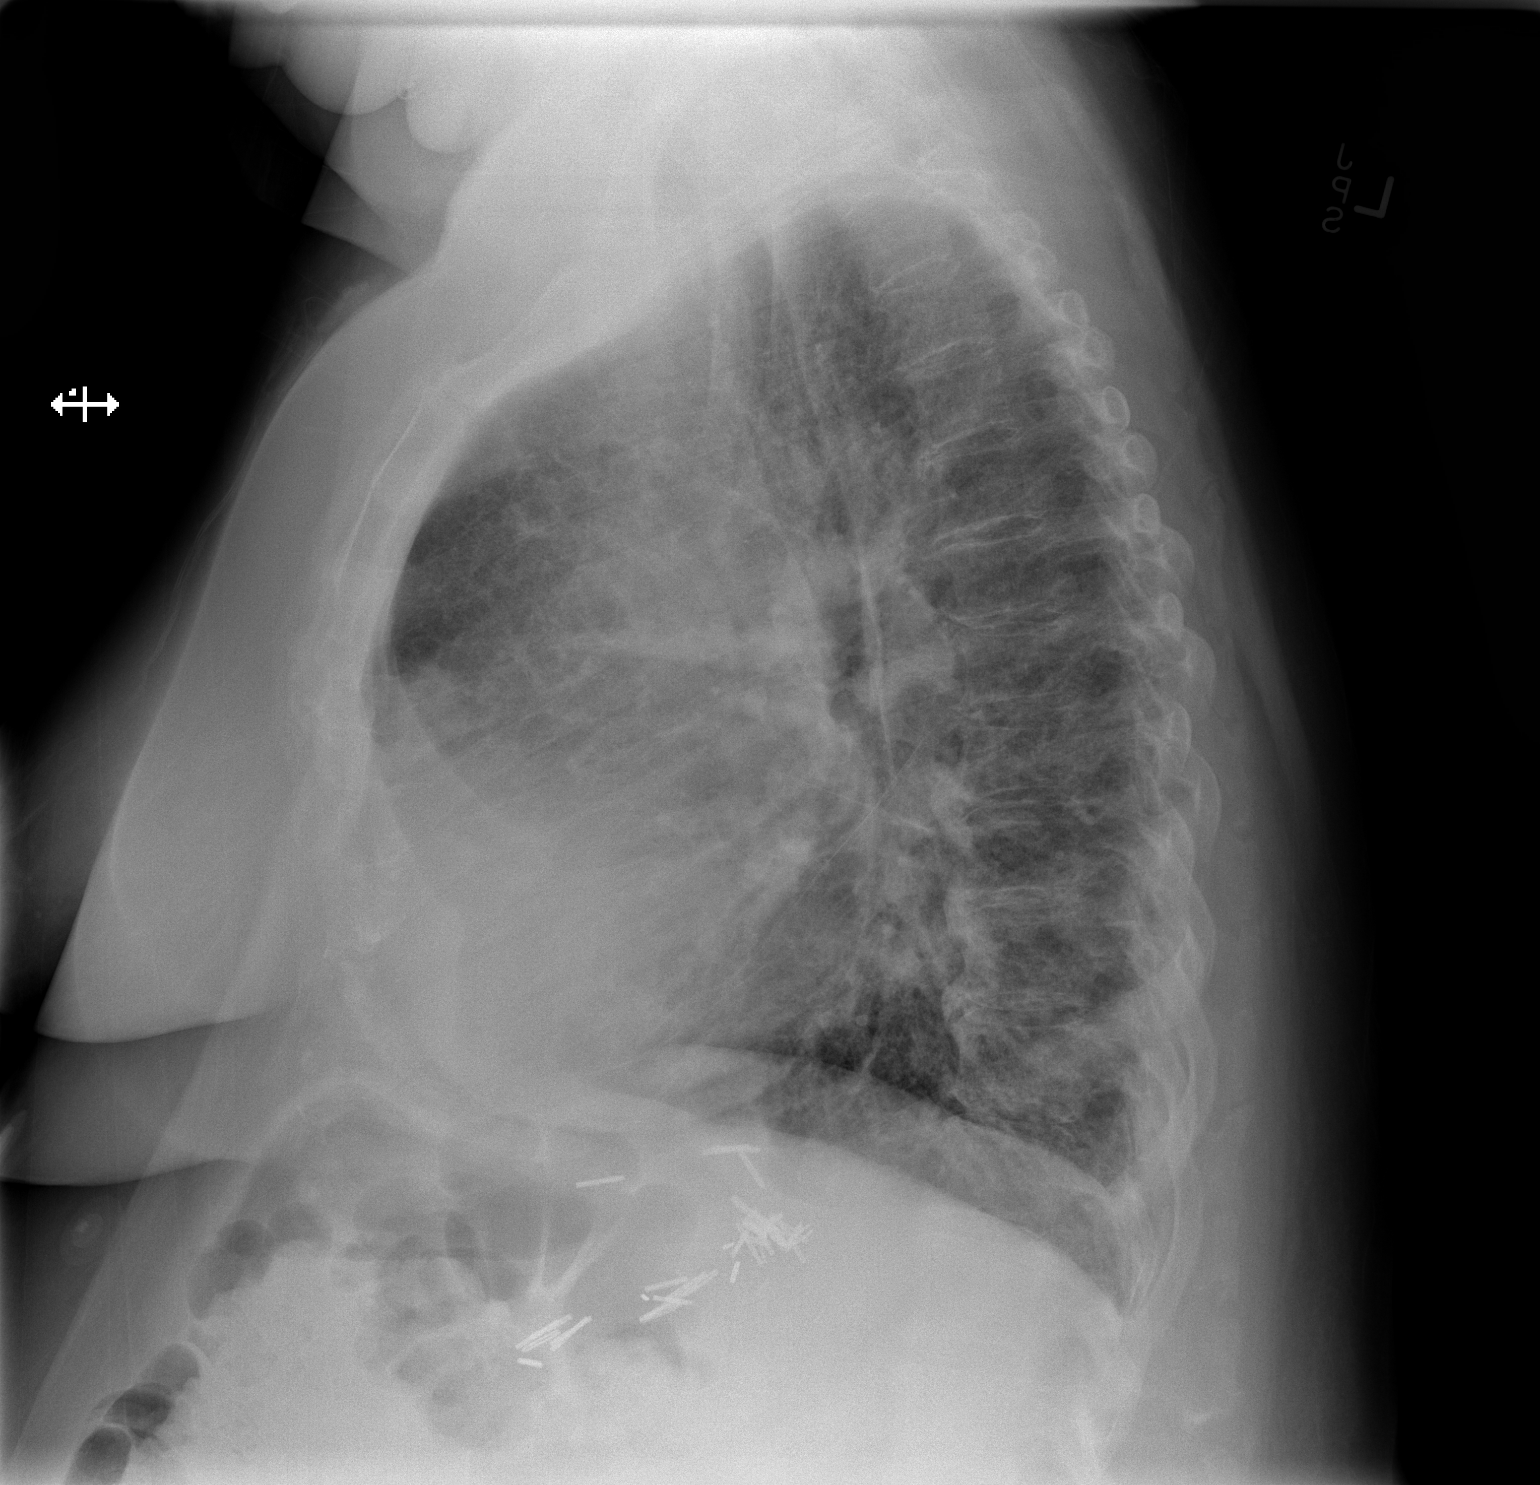

[2 of 2 positions shown; findings below may reference images not displayed]

FINDINGS: Enlargement of cardiac silhouette with pulmonary vascular
congestion.

Mediastinal contour stable.

Slightly decreased interstitial prominence since previous exam.

No definite acute pulmonary edema, segmental consolidation, pleural
effusion, or pneumothorax.

Bones appear demineralized with endplate spur formation lower
thoracic spine.

Surgical clips at perigastric region.
IMPRESSION: Enlargement of cardiac silhouette with chronic pulmonary vascular
congestion.

No acute pulmonary edema or consolidation identified.

## 2016-07-05 DIAGNOSIS — R0902 Hypoxemia: Secondary | ICD-10-CM | POA: Diagnosis not present

## 2016-07-05 DIAGNOSIS — J449 Chronic obstructive pulmonary disease, unspecified: Secondary | ICD-10-CM | POA: Diagnosis not present

## 2016-07-05 DIAGNOSIS — J9600 Acute respiratory failure, unspecified whether with hypoxia or hypercapnia: Secondary | ICD-10-CM | POA: Diagnosis not present

## 2016-07-17 ENCOUNTER — Encounter (HOSPITAL_COMMUNITY): Payer: Self-pay | Admitting: Emergency Medicine

## 2016-07-17 ENCOUNTER — Emergency Department (HOSPITAL_COMMUNITY): Payer: Medicare HMO

## 2016-07-17 ENCOUNTER — Inpatient Hospital Stay (HOSPITAL_COMMUNITY)
Admission: EM | Admit: 2016-07-17 | Discharge: 2016-07-20 | DRG: 189 | Disposition: A | Discharge status: Home / Self Care | Payer: Medicare HMO | Attending: Internal Medicine | Admitting: Internal Medicine

## 2016-07-17 DIAGNOSIS — R0602 Shortness of breath: Secondary | ICD-10-CM | POA: Diagnosis not present

## 2016-07-17 DIAGNOSIS — Z6841 Body Mass Index (BMI) 40.0 and over, adult: Secondary | ICD-10-CM | POA: Diagnosis not present

## 2016-07-17 DIAGNOSIS — R069 Unspecified abnormalities of breathing: Secondary | ICD-10-CM | POA: Diagnosis not present

## 2016-07-17 DIAGNOSIS — M1990 Unspecified osteoarthritis, unspecified site: Secondary | ICD-10-CM | POA: Diagnosis present

## 2016-07-17 DIAGNOSIS — I959 Hypotension, unspecified: Secondary | ICD-10-CM | POA: Diagnosis not present

## 2016-07-17 DIAGNOSIS — Z85118 Personal history of other malignant neoplasm of bronchus and lung: Secondary | ICD-10-CM | POA: Diagnosis not present

## 2016-07-17 DIAGNOSIS — J9811 Atelectasis: Secondary | ICD-10-CM | POA: Diagnosis present

## 2016-07-17 DIAGNOSIS — R918 Other nonspecific abnormal finding of lung field: Secondary | ICD-10-CM | POA: Diagnosis not present

## 2016-07-17 DIAGNOSIS — K219 Gastro-esophageal reflux disease without esophagitis: Secondary | ICD-10-CM | POA: Diagnosis present

## 2016-07-17 DIAGNOSIS — Z8614 Personal history of Methicillin resistant Staphylococcus aureus infection: Secondary | ICD-10-CM

## 2016-07-17 DIAGNOSIS — I2729 Other secondary pulmonary hypertension: Secondary | ICD-10-CM | POA: Diagnosis present

## 2016-07-17 DIAGNOSIS — J9620 Acute and chronic respiratory failure, unspecified whether with hypoxia or hypercapnia: Secondary | ICD-10-CM | POA: Diagnosis present

## 2016-07-17 DIAGNOSIS — E1122 Type 2 diabetes mellitus with diabetic chronic kidney disease: Secondary | ICD-10-CM | POA: Diagnosis present

## 2016-07-17 DIAGNOSIS — I5042 Chronic combined systolic (congestive) and diastolic (congestive) heart failure: Secondary | ICD-10-CM | POA: Diagnosis present

## 2016-07-17 DIAGNOSIS — I130 Hypertensive heart and chronic kidney disease with heart failure and stage 1 through stage 4 chronic kidney disease, or unspecified chronic kidney disease: Secondary | ICD-10-CM | POA: Diagnosis present

## 2016-07-17 DIAGNOSIS — I509 Heart failure, unspecified: Secondary | ICD-10-CM | POA: Diagnosis not present

## 2016-07-17 DIAGNOSIS — Z87891 Personal history of nicotine dependence: Secondary | ICD-10-CM | POA: Diagnosis not present

## 2016-07-17 DIAGNOSIS — J90 Pleural effusion, not elsewhere classified: Secondary | ICD-10-CM

## 2016-07-17 DIAGNOSIS — Z801 Family history of malignant neoplasm of trachea, bronchus and lung: Secondary | ICD-10-CM | POA: Diagnosis not present

## 2016-07-17 DIAGNOSIS — Z888 Allergy status to other drugs, medicaments and biological substances status: Secondary | ICD-10-CM | POA: Diagnosis not present

## 2016-07-17 DIAGNOSIS — E1142 Type 2 diabetes mellitus with diabetic polyneuropathy: Secondary | ICD-10-CM | POA: Diagnosis present

## 2016-07-17 DIAGNOSIS — R911 Solitary pulmonary nodule: Secondary | ICD-10-CM | POA: Diagnosis present

## 2016-07-17 DIAGNOSIS — F329 Major depressive disorder, single episode, unspecified: Secondary | ICD-10-CM | POA: Diagnosis present

## 2016-07-17 DIAGNOSIS — I2510 Atherosclerotic heart disease of native coronary artery without angina pectoris: Secondary | ICD-10-CM | POA: Diagnosis present

## 2016-07-17 DIAGNOSIS — G4730 Sleep apnea, unspecified: Secondary | ICD-10-CM | POA: Diagnosis present

## 2016-07-17 DIAGNOSIS — I5032 Chronic diastolic (congestive) heart failure: Secondary | ICD-10-CM | POA: Diagnosis not present

## 2016-07-17 DIAGNOSIS — J449 Chronic obstructive pulmonary disease, unspecified: Secondary | ICD-10-CM | POA: Diagnosis not present

## 2016-07-17 DIAGNOSIS — R091 Pleurisy: Secondary | ICD-10-CM | POA: Diagnosis not present

## 2016-07-17 DIAGNOSIS — J9621 Acute and chronic respiratory failure with hypoxia: Secondary | ICD-10-CM

## 2016-07-17 DIAGNOSIS — Z90710 Acquired absence of both cervix and uterus: Secondary | ICD-10-CM

## 2016-07-17 DIAGNOSIS — I2781 Cor pulmonale (chronic): Secondary | ICD-10-CM | POA: Diagnosis present

## 2016-07-17 DIAGNOSIS — I255 Ischemic cardiomyopathy: Secondary | ICD-10-CM | POA: Diagnosis not present

## 2016-07-17 DIAGNOSIS — Z79899 Other long term (current) drug therapy: Secondary | ICD-10-CM

## 2016-07-17 DIAGNOSIS — Z955 Presence of coronary angioplasty implant and graft: Secondary | ICD-10-CM

## 2016-07-17 DIAGNOSIS — Z7951 Long term (current) use of inhaled steroids: Secondary | ICD-10-CM

## 2016-07-17 DIAGNOSIS — R0600 Dyspnea, unspecified: Secondary | ICD-10-CM | POA: Diagnosis present

## 2016-07-17 DIAGNOSIS — Z9981 Dependence on supplemental oxygen: Secondary | ICD-10-CM | POA: Diagnosis not present

## 2016-07-17 DIAGNOSIS — I2723 Pulmonary hypertension due to lung diseases and hypoxia: Secondary | ICD-10-CM | POA: Diagnosis not present

## 2016-07-17 DIAGNOSIS — N189 Chronic kidney disease, unspecified: Secondary | ICD-10-CM | POA: Diagnosis not present

## 2016-07-17 DIAGNOSIS — J189 Pneumonia, unspecified organism: Secondary | ICD-10-CM | POA: Diagnosis present

## 2016-07-17 DIAGNOSIS — J440 Chronic obstructive pulmonary disease with acute lower respiratory infection: Secondary | ICD-10-CM | POA: Diagnosis present

## 2016-07-17 DIAGNOSIS — E785 Hyperlipidemia, unspecified: Secondary | ICD-10-CM | POA: Diagnosis present

## 2016-07-17 DIAGNOSIS — E119 Type 2 diabetes mellitus without complications: Secondary | ICD-10-CM | POA: Diagnosis not present

## 2016-07-17 DIAGNOSIS — Z8249 Family history of ischemic heart disease and other diseases of the circulatory system: Secondary | ICD-10-CM

## 2016-07-17 DIAGNOSIS — N184 Chronic kidney disease, stage 4 (severe): Secondary | ICD-10-CM | POA: Diagnosis present

## 2016-07-17 DIAGNOSIS — Z7984 Long term (current) use of oral hypoglycemic drugs: Secondary | ICD-10-CM

## 2016-07-17 DIAGNOSIS — R0902 Hypoxemia: Secondary | ICD-10-CM | POA: Diagnosis not present

## 2016-07-17 DIAGNOSIS — Z9889 Other specified postprocedural states: Secondary | ICD-10-CM | POA: Diagnosis not present

## 2016-07-17 DIAGNOSIS — J948 Other specified pleural conditions: Secondary | ICD-10-CM | POA: Diagnosis not present

## 2016-07-17 DIAGNOSIS — Z923 Personal history of irradiation: Secondary | ICD-10-CM

## 2016-07-17 DIAGNOSIS — R0609 Other forms of dyspnea: Secondary | ICD-10-CM | POA: Diagnosis not present

## 2016-07-17 DIAGNOSIS — Z7982 Long term (current) use of aspirin: Secondary | ICD-10-CM

## 2016-07-17 DIAGNOSIS — Z972 Presence of dental prosthetic device (complete) (partial): Secondary | ICD-10-CM

## 2016-07-17 DIAGNOSIS — Z794 Long term (current) use of insulin: Secondary | ICD-10-CM | POA: Diagnosis not present

## 2016-07-17 DIAGNOSIS — Z9119 Patient's noncompliance with other medical treatment and regimen: Secondary | ICD-10-CM

## 2016-07-17 LAB — BASIC METABOLIC PANEL
Anion gap: 11 (ref 5–15)
BUN: 31 mg/dL — AB (ref 6–20)
CALCIUM: 8.8 mg/dL — AB (ref 8.9–10.3)
CHLORIDE: 98 mmol/L — AB (ref 101–111)
CO2: 31 mmol/L (ref 22–32)
CREATININE: 3.31 mg/dL — AB (ref 0.44–1.00)
GFR calc non Af Amer: 14 mL/min — ABNORMAL LOW (ref >60)
GFR, EST AFRICAN AMERICAN: 16 mL/min — AB (ref >60)
GLUCOSE: 138 mg/dL — AB (ref 65–99)
Potassium: 4.2 mmol/L (ref 3.5–5.1)
Sodium: 140 mmol/L (ref 135–145)

## 2016-07-17 LAB — CBC WITH DIFFERENTIAL/PLATELET
BASOS PCT: 0 %
Basophils Absolute: 0 10*3/uL (ref 0.0–0.1)
EOS ABS: 0.7 10*3/uL (ref 0.0–0.7)
EOS PCT: 6 %
HCT: 30.1 % — ABNORMAL LOW (ref 36.0–46.0)
Hemoglobin: 9.2 g/dL — ABNORMAL LOW (ref 12.0–15.0)
LYMPHS ABS: 0.9 10*3/uL (ref 0.7–4.0)
Lymphocytes Relative: 7 %
MCH: 29.9 pg (ref 26.0–34.0)
MCHC: 30.6 g/dL (ref 30.0–36.0)
MCV: 97.7 fL (ref 78.0–100.0)
MONO ABS: 0.6 10*3/uL (ref 0.1–1.0)
MONOS PCT: 5 %
Neutro Abs: 9.9 10*3/uL — ABNORMAL HIGH (ref 1.7–7.7)
Neutrophils Relative %: 82 %
Platelets: 206 10*3/uL (ref 150–400)
RBC: 3.08 MIL/uL — ABNORMAL LOW (ref 3.87–5.11)
RDW: 13.8 % (ref 11.5–15.5)
WBC: 12.1 10*3/uL — ABNORMAL HIGH (ref 4.0–10.5)

## 2016-07-17 LAB — I-STAT TROPONIN, ED: Troponin i, poc: 0 ng/mL (ref 0.00–0.08)

## 2016-07-17 LAB — BRAIN NATRIURETIC PEPTIDE: B NATRIURETIC PEPTIDE 5: 62.9 pg/mL (ref 0.0–100.0)

## 2016-07-17 MED ORDER — FUROSEMIDE 10 MG/ML IJ SOLN
80.0000 mg | INTRAMUSCULAR | Status: AC
  Administered 2016-07-18: 80 mg via INTRAVENOUS
  Filled 2016-07-17: qty 8

## 2016-07-17 MED ORDER — ALBUTEROL (5 MG/ML) CONTINUOUS INHALATION SOLN
10.0000 mg/h | INHALATION_SOLUTION | RESPIRATORY_TRACT | Status: AC
  Administered 2016-07-17: 10 mg/h via RESPIRATORY_TRACT
  Filled 2016-07-17: qty 20

## 2016-07-17 NOTE — ED Provider Notes (Signed)
[facility] DEPT Provider Note   CSN: [id number] Arrival date & time: 07/17/16  2039     History   Chief Complaint Chief Complaint  Patient presents with  . Shortness of Breath    HPI [name] is a 61 y.o. female.  SOB began 2-3 days ago and has progressively worsened since it began. She tried to do a breathing treatment this afternoon but had lost power at her home. She reports associated chest tightness that is intermittent. No associated fevers, cough, sputum production, vomiting, or diarrhea. She reports mild improvement after receiving albuterol and Solu-Medrol by EMS.  She chronically sleeps on 2-3 pillows at night with no change recently. No increase in LE edema and no recent weight gain.   The history is provided by the patient.  Shortness of Breath     Past Medical History:  Diagnosis Date  . Arthritis    "some scattered" (03/03/2015)  . CHF (congestive heart failure) ([place])   . Chronic bronchitis ([place])    "get it most years" (03/03/2015)  . COPD (chronic obstructive pulmonary disease) (HCC)    Severe. Gold Stage IV.  PFTs (12/2008) - severe obstructive airway disease. Active tobacco use. Requires 4L O2 at home.  . Coronary artery disease    S/P PCI of LAD with DES (12/2008). Total occlusion of RCA noted at that time., medically managed. ACS ruled out 03/2009 with Lexiscan myoview . Followed by [facility].  . Depression   . Diastolic dysfunction    2-D Echo (12/2008) - Normal LV Systolic funciton with EF 60-65%. Grade 1 diastolid dysfunction. No regional wall motion abnormalities. Moderate pulmonary HTN with PA peak pressure 65mHg.  . Full dentures   . GERD (gastroesophageal reflux disease)    S/P Nissen fundoplication.  .Marland KitchenHistory of hiatal hernia   . Hx MRSA infection    Recurrent MRSA thigh abscesses.  . Hyperlipidemia   . Lung cancer ([place]    "left"  . Obesity   . On home oxygen therapy since 2010   4L all the time  . Pneumonia   . Prediabetes      HgbA1c 6.4 (12/2008)  . Pulmonary hypertension ([place]    2-D Echo (037/8588 - Systolic pressure was moderately increased. PA peak pressure  550mg. 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 Followed by Dr. WrJoya GaskinsLVelora Heckler . Pulmonary nodule, right    Small right middle lobe nodule. Stable as of 12/2008.  . Marland Kitchenhortness of breath dyspnea    with a lot of exertion; if fluid builds up    Patient Active Problem List   Diagnosis Date Noted  . Recurrent left pleural effusion   . Acute on chronic respiratory failure with hypoxia and hypercapnia (HCC)   . Centrilobular emphysema ([place]CLittle [place]  . Dyspnea on exertion   . Acute respiratory failure (HCShow Low01/03/2017  . Community acquired pneumonia 04/15/2016  . Constipation   . Anemia, normocytic normochromic   . History of adenocarcinoma of lung   . Vitamin D deficiency 02/15/2016  . Oral candidiasis 02/15/2016  . Bronchitis, chronic obstructive w acute bronchitis (HCHarrison[place]11/06/2015  . Elevated alkaline phosphatase level 02/05/2016  . Pleural effusion, left 02/05/2016  . Acute kidney injury (HCCarrollton11/05/2015  . CKD (chronic kidney disease) 02/04/2016  . Benign paroxysmal positional vertigo 09/10/2015  . Subjective tinnitus 09/10/2015  . Chronic venous insufficiency   . COPD with exacerbation (HCCarp[place]  04/10/2015  . Vitamin B12 deficiency neuropathy ([place]) 12/21/2014  . Primary adenocarcinoma of left lung ([place]) 12/04/2014  . Acute on chronic respiratory failure ([place]) 07/01/2013  . Healthcare maintenance 02/06/2013  . Chronic diastolic heart failure ([place]) 11/05/2012  . Hyperlipidemia   . Financial difficulties 06/15/2010  . Pulmonary hypertension ([place]) 01/05/2009  . Obesity, Class II, BMI 35-39.9 12/31/2008  . Sleep apnea 12/31/2008  . Tobacco abuse 12/22/2008  . 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  Severe chronic obstructive pulmonary disease ([place]) 12/22/2008  . Gastroesophageal reflux disease 12/22/2008  . 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 ([place]) 12/22/2008    Past Surgical History:  Procedure Laterality Date  . ABDOMINAL HYSTERECTOMY  1980's   "still have my cervix"  . APPENDECTOMY    . BACK SURGERY    . BILATERAL OOPHORECTOMY  1979  . BREAST LUMPECTOMY Right 1990's    (biopsy negative)  . CORONARY ANGIOPLASTY WITH STENT PLACEMENT  2010   [place]; RCA  . HERNIA REPAIR    . KNEE ARTHROSCOPY Right ~ 2000  . [place] SURGERY  2001   "herniated discs; both by [facility], Dr. Lorin Mercy"  . NISSEN FUNDOPLICATION    . RIGHT HEART CATHETERIZATION N/A 11/09/2012   Procedure: RIGHT HEART CATH;  Surgeon: Larey Dresser, MD;  Location: [facility] CATH LAB;  Service: Cardiovascular;  Laterality: N/A;  . TONSILLECTOMY    . TUBAL LIGATION  1979  . VIDEO BRONCHOSCOPY WITH ENDOBRONCHIAL ULTRASOUND N/A 12/15/2014   Procedure: VIDEO BRONCHOSCOPY WITH ENDOBRONCHIAL ULTRASOUND;  Surgeon: Ivin Poot, MD;  Location: [facility] OR;  Service: Thoracic;  Laterality: N/A;    OB History    No data available       Home Medications    Prior to Admission medications   Medication Sig Start Date End Date Taking? Authorizing Provider  albuterol (VENTOLIN HFA) 108 (90 Base) MCG/ACT inhaler Inhale 1-2 puffs into the lungs every 6 (six) hours as needed for shortness of breath. 02/03/16  Yes Oval Linsey, MD  arformoterol (BROVANA) 15 MCG/2ML NEBU Take 2 mLs (15 mcg total) by nebulization 2 (two) times daily. 06/10/16  Yes Oval Linsey, MD  aspirin 81 MG chewable tablet Chew 1 tablet (81 mg total) by mouth at bedtime. 01/16/15  Yes Oval Linsey, MD  budesonide (PULMICORT) 0.5 MG/2ML nebulizer solution Take 2 mLs (0.5 mg total) by nebulization 2 (two) times daily. 06/10/16  Yes Oval Linsey, MD  buPROPion [facility] SR) 150 MG 12 hr tablet Take 1 tablet (150 mg total) by mouth 2 (two) times  daily. 06/02/16  Yes Oval Linsey, MD  furosemide (LASIX) 40 MG tablet Take 80 mg by mouth daily.   Yes Historical Provider, MD  gabapentin (NEURONTIN) 300 MG capsule Take 1 capsule (300 mg total) by mouth 3 (three) times daily. Patient taking differently: Take 300 mg by mouth at bedtime.  02/03/16  Yes Oval Linsey, MD  lisinopril (PRINIVIL,ZESTRIL) 5 MG tablet Take 5 mg by mouth at bedtime.   Yes Historical Provider, MD  metFORMIN (GLUCOPHAGE) 500 MG tablet Take 1 tablet (500 mg total) by mouth 2 (two) times daily with a meal. 09/10/15  Yes Oval Linsey, MD  metolazone (ZAROXOLYN) 2.5 MG tablet TAKE 1 TABLET BY MOUTH AS NEEDED(FOR 5 LBS WEIGHT GAIN) 05/13/15  Yes [name] Friar, PA-C  pantoprazole (PROTONIX) 40 MG tablet Take 1 tablet (40 mg total) by mouth daily. 11/05/15  Yes Oval Linsey, MD  rosuvastatin ([place])  20 MG tablet Take 1 tablet (20 mg total) by mouth daily. 09/10/15  Yes Oval Linsey, MD  umeclidinium bromide (INCRUSE ELLIPTA) 62.5 MCG/INH AEPB Inhale 1 puff into the lungs daily. 06/10/16  Yes Oval Linsey, MD  vitamin B-12 (CYANOCOBALAMIN) 1000 MCG tablet Take 1 tablet (1,000 mcg total) by mouth daily. 09/10/15  Yes Oval Linsey, MD  HYDROcodone-acetaminophen (NORCO/VICODIN) 5-325 MG tablet Take 1-2 tablets by mouth every 6 (six) hours as needed for moderate pain. Patient not taking: Reported on 07/17/2016 06/01/16   Fredia Sorrow, MD  potassium chloride SA (K-DUR,KLOR-CON) 20 MEQ tablet Take 1 tablet (20 mEq total) by mouth daily. Patient not taking: Reported on 07/17/2016 11/30/15   Oval Linsey, MD    Family History Family History  Problem Relation Age of Onset  . Heart disease Mother 56    Deceased from MI at 32yo  . Hypertension Mother   . Heart disease Father 66    Deceased of MI age 37yo  . Hypertension Father   . Hypertension Brother   . Lung cancer      Grandmother    Social History Social History  Substance Use Topics  . Smoking status: Former  Smoker    Packs/day: 1.00    Years: 45.00    Types: Cigarettes    Start date: 04/04/1969    Quit date: 11/04/2015  . Smokeless tobacco: Never Used     Comment: 1 cig per day  . Alcohol use No     Allergies   Fluconazole and Atorvastatin   Review of Systems Review of Systems  Respiratory: Positive for shortness of breath.   All other systems reviewed and are negative.    Physical Exam Updated Vital Signs BP (!) 128/56 (BP Location: Right Arm)   Pulse 90   Temp 98.7 F (37.1 C) (Oral)   Resp (!) 21   SpO2 90%   Physical Exam  Constitutional: She is oriented to person, place, and time. She appears well-developed and well-nourished.  Chronically ill-appearing, dyspneic but no acute distress  HENT:  Head: Normocephalic and atraumatic.  Eyes: Conjunctivae are normal. Pupils are equal, round, and reactive to light.  Neck: Neck supple.  Cardiovascular: Normal rate, regular rhythm and normal heart sounds.   No murmur heard. Pulmonary/Chest:  Increased WOB with severely diminished BS b/l, crackles in bases b/l  Abdominal: Soft. Bowel sounds are normal. She exhibits no distension. There is no tenderness.  Musculoskeletal: She exhibits no edema.  Neurological: She is alert and oriented to person, place, and time.  Fluent speech  Skin: Skin is warm and dry.  Psychiatric: She has a normal mood and affect. Judgment normal.  Nursing note and vitals reviewed.    ED Treatments / Results  Labs (all labs ordered are listed, but only abnormal results are displayed) Labs Reviewed  BASIC METABOLIC PANEL - Abnormal; Notable for the following:       Result Value   Chloride 98 (*)    Glucose, Bld 138 (*)    BUN 31 (*)    Creatinine, Ser 3.31 (*)    Calcium 8.8 (*)    GFR calc non Af Amer 14 (*)    GFR calc Af Amer 16 (*)    All other components within normal limits  CBC WITH DIFFERENTIAL/PLATELET - Abnormal; Notable for the following:    WBC 12.1 (*)    RBC 3.08 (*)     Hemoglobin 9.2 (*)    HCT 30.1 (*)    Neutro  Abs 9.9 (*)    All other components within normal limits  BRAIN NATRIURETIC PEPTIDE  I-STAT TROPOININ, ED    EKG  EKG Interpretation  Date/Time:  Sunday July 17 2016 20:52:27 EDT Ventricular Rate:  86 PR Interval:    QRS Duration: 96 QT Interval:  368 QTC Calculation: 441 R Axis:   95 Text Interpretation:  Sinus rhythm Low voltage, precordial leads Consider right ventricular hypertrophy Borderline T abnormalities, anterior leads No significant change since last tracing Confirmed by LITTLE MD, RACHEL [phone]) on 07/17/2016 9:15:58 PM       Radiology Dg Chest 2 View  Result Date: 07/17/2016 CLINICAL DATA:  Worsening shortness of breath for 2 days. History of lung cancer, congestive heart failure, pneumonia, coronary artery disease, hypertension, chronic bronchitis, emphysema, former smoker. EXAM: CHEST  2 VIEW COMPARISON:  06/01/2016 FINDINGS: Moderate left pleural effusion with collapse and consolidation in the left lung base. Appearances similar to previous study. Atelectasis or infiltration in the right lung base is increasing. No pneumothorax. Calcification of the aorta. Heart size is obscured by the parenchymal process but appears mildly enlarged. Surgical clips in the left upper quadrant. IMPRESSION: Left pleural effusion with basilar atelectasis and consolidation is unchanged. Increasing atelectasis or infiltration in the right lung base. Electronically Signed   By: Lucienne Capers M.D.   On: 07/17/2016 22:11    Procedures Procedures (including critical care time)  Medications Ordered in ED Medications  albuterol (PROVENTIL,VENTOLIN) solution continuous neb (0 mg/hr Nebulization Stopped 07/18/16 0005)  cefTRIAXone (ROCEPHIN) 1 g in dextrose 5 % 50 mL IVPB (not administered)  azithromycin (ZITHROMAX) 500 mg in dextrose 5 % 250 mL IVPB (not administered)  furosemide (LASIX) injection 80 mg (80 mg Intravenous Given 07/18/16 0005)      Initial Impression / Assessment and Plan / ED Course  I have reviewed the triage vital signs and the nursing notes.  Pertinent labs & imaging results that were available during my care of the patient were reviewed by me and considered in my medical decision making (see chart for details).    PT w/ h/o chronic respiratory failure, COPD on 4L home O2, CHF p/w a few days of worsening shortness of breath. She was dismissed neck with increased work of breathing but no respiratory distress on exam, mentating appropriately. During my examination she was 90% on 4 L. She had severely diminished breath sounds with crackles in bases. No lower extremity edema. She denies any recent weight gain. Chest x-ray shows large left pleural effusion with increasing infiltration in R lung base. Labwork shows persistent kidney disease with creatinine of 3.3 similar to recent value, hemoglobin 9.2, WBC 12.1. Negative troponin and normal BNP. EKG similar to previous.  She demonstrated no significant improvement after a 1 hour DuoNeb and I feel her symptoms are more likely due to large effusion rather than COPD exacerbation. I reviewed her last hospitalization which involved thoracentesis and diuresis to improve her symptoms. Gave IV Lasix. Because of increasing infiltration in right lung base, I did cover with antibiotics, CTX and azithromycin. The patient remains dyspneic on 5 L but is mentating appropriately and her other vital signs are stable. I discussed admission at [facility] with Anderson Malta on internal medicine teaching service. Pt accepted to IM service and will be transferred for further care.  Final Clinical Impressions(s) / ED Diagnoses   Final diagnoses:  Acute on chronic respiratory failure with hypoxia (HCC)  Recurrent left pleural effusion    New Prescriptions New Prescriptions  No medications on file     Sharlett Iles, MD 07/18/16 [phone]

## 2016-07-17 NOTE — ED Triage Notes (Signed)
Brought in by EMS from home with c/o worsening shortness of breath.  Pt reported that she has been having shortness of breath for the past 2 days but it got worse tonight.  Hx of CHF and COPD.  O2 sat 89% on room air by EMS.  Was given Solu-Medrol 125 mg IV on scene, and neb tx of Albuterol 10 mg and Atrovent 0.5 mg.  Arrived to ED with ongoing neb tx via )2 delivery; O2 sat 100%.

## 2016-07-17 NOTE — ED Notes (Signed)
Bed: YJ85 Expected date:  Expected time:  Means of arrival:  Comments: 61 yo F/ CP-Shortness of breath

## 2016-07-18 ENCOUNTER — Encounter (HOSPITAL_COMMUNITY): Payer: Self-pay | Admitting: Physician Assistant

## 2016-07-18 ENCOUNTER — Observation Stay (HOSPITAL_COMMUNITY): Payer: Medicare HMO

## 2016-07-18 DIAGNOSIS — Z923 Personal history of irradiation: Secondary | ICD-10-CM

## 2016-07-18 DIAGNOSIS — Z87891 Personal history of nicotine dependence: Secondary | ICD-10-CM | POA: Diagnosis not present

## 2016-07-18 DIAGNOSIS — I5032 Chronic diastolic (congestive) heart failure: Secondary | ICD-10-CM | POA: Diagnosis not present

## 2016-07-18 DIAGNOSIS — R0602 Shortness of breath: Secondary | ICD-10-CM

## 2016-07-18 DIAGNOSIS — I2723 Pulmonary hypertension due to lung diseases and hypoxia: Secondary | ICD-10-CM | POA: Diagnosis not present

## 2016-07-18 DIAGNOSIS — Z801 Family history of malignant neoplasm of trachea, bronchus and lung: Secondary | ICD-10-CM

## 2016-07-18 DIAGNOSIS — R091 Pleurisy: Secondary | ICD-10-CM | POA: Diagnosis not present

## 2016-07-18 DIAGNOSIS — E1142 Type 2 diabetes mellitus with diabetic polyneuropathy: Secondary | ICD-10-CM

## 2016-07-18 DIAGNOSIS — Z8249 Family history of ischemic heart disease and other diseases of the circulatory system: Secondary | ICD-10-CM

## 2016-07-18 DIAGNOSIS — R0600 Dyspnea, unspecified: Secondary | ICD-10-CM | POA: Diagnosis present

## 2016-07-18 DIAGNOSIS — I2510 Atherosclerotic heart disease of native coronary artery without angina pectoris: Secondary | ICD-10-CM

## 2016-07-18 DIAGNOSIS — R918 Other nonspecific abnormal finding of lung field: Secondary | ICD-10-CM

## 2016-07-18 DIAGNOSIS — J449 Chronic obstructive pulmonary disease, unspecified: Secondary | ICD-10-CM | POA: Diagnosis not present

## 2016-07-18 DIAGNOSIS — Z888 Allergy status to other drugs, medicaments and biological substances status: Secondary | ICD-10-CM

## 2016-07-18 DIAGNOSIS — J90 Pleural effusion, not elsewhere classified: Secondary | ICD-10-CM | POA: Diagnosis not present

## 2016-07-18 DIAGNOSIS — Z794 Long term (current) use of insulin: Secondary | ICD-10-CM

## 2016-07-18 DIAGNOSIS — Z9981 Dependence on supplemental oxygen: Secondary | ICD-10-CM

## 2016-07-18 DIAGNOSIS — Z85118 Personal history of other malignant neoplasm of bronchus and lung: Secondary | ICD-10-CM | POA: Diagnosis not present

## 2016-07-18 HISTORY — PX: IR THORACENTESIS ASP PLEURAL SPACE W/IMG GUIDE: IMG5380

## 2016-07-18 LAB — CBC
HCT: 30.2 % — ABNORMAL LOW (ref 36.0–46.0)
Hemoglobin: 8.8 g/dL — ABNORMAL LOW (ref 12.0–15.0)
MCH: 28.3 pg (ref 26.0–34.0)
MCHC: 29.1 g/dL — AB (ref 30.0–36.0)
MCV: 97.1 fL (ref 78.0–100.0)
PLATELETS: 220 10*3/uL (ref 150–400)
RBC: 3.11 MIL/uL — ABNORMAL LOW (ref 3.87–5.11)
RDW: 13.8 % (ref 11.5–15.5)
WBC: 11.8 10*3/uL — ABNORMAL HIGH (ref 4.0–10.5)

## 2016-07-18 LAB — BASIC METABOLIC PANEL
Anion gap: 14 (ref 5–15)
BUN: 30 mg/dL — AB (ref 6–20)
CALCIUM: 8.6 mg/dL — AB (ref 8.9–10.3)
CO2: 30 mmol/L (ref 22–32)
CREATININE: 3.54 mg/dL — AB (ref 0.44–1.00)
Chloride: 93 mmol/L — ABNORMAL LOW (ref 101–111)
GFR calc Af Amer: 15 mL/min — ABNORMAL LOW (ref >60)
GFR calc non Af Amer: 13 mL/min — ABNORMAL LOW (ref >60)
GLUCOSE: 310 mg/dL — AB (ref 65–99)
Potassium: 4 mmol/L (ref 3.5–5.1)
Sodium: 137 mmol/L (ref 135–145)

## 2016-07-18 LAB — BODY FLUID CELL COUNT WITH DIFFERENTIAL
Eos, Fluid: 1 %
LYMPHS FL: 35 %
Monocyte-Macrophage-Serous Fluid: 54 % (ref 50–90)
Neutrophil Count, Fluid: 10 % (ref 0–25)
Total Nucleated Cell Count, Fluid: 314 cu mm (ref 0–1000)

## 2016-07-18 LAB — LACTATE DEHYDROGENASE, PLEURAL OR PERITONEAL FLUID: LD FL: 113 U/L — AB (ref 3–23)

## 2016-07-18 LAB — GLUCOSE, PLEURAL OR PERITONEAL FLUID: GLUCOSE FL: 210 mg/dL

## 2016-07-18 LAB — GLUCOSE, CAPILLARY
Glucose-Capillary: 263 mg/dL — ABNORMAL HIGH (ref 65–99)
Glucose-Capillary: 207 mg/dL — ABNORMAL HIGH (ref 65–99)
Glucose-Capillary: 154 mg/dL — ABNORMAL HIGH (ref 65–99)
Glucose-Capillary: 171 mg/dL — ABNORMAL HIGH (ref 65–99)

## 2016-07-18 LAB — RAPID URINE DRUG SCREEN, HOSP PERFORMED
AMPHETAMINES: NOT DETECTED
Barbiturates: NOT DETECTED
Benzodiazepines: NOT DETECTED
Cocaine: NOT DETECTED
Opiates: NOT DETECTED
Tetrahydrocannabinol: NOT DETECTED

## 2016-07-18 LAB — PROTEIN, TOTAL: Total Protein: 6.6 g/dL (ref 6.5–8.1)

## 2016-07-18 LAB — TROPONIN I: Troponin I: <0.03 ng/mL (ref <0.03)

## 2016-07-18 LAB — PROTEIN, PLEURAL OR PERITONEAL FLUID: Total protein, fluid: 4 g/dL

## 2016-07-18 LAB — HIV ANTIBODY (ROUTINE TESTING W REFLEX): HIV SCREEN 4TH GENERATION: NONREACTIVE

## 2016-07-18 LAB — LACTATE DEHYDROGENASE: LDH: 139 U/L (ref 98–192)

## 2016-07-18 LAB — PROCALCITONIN: PROCALCITONIN: 0.1 ng/mL

## 2016-07-18 LAB — ALBUMIN, PLEURAL OR PERITONEAL FLUID: Albumin, Fluid: 2.3 g/dL

## 2016-07-18 LAB — MRSA PCR SCREENING: MRSA by PCR: NEGATIVE

## 2016-07-18 MED ORDER — DEXTROSE 5 % IV SOLN
500.0000 mg | Freq: Once | INTRAVENOUS | Status: AC
  Administered 2016-07-18: 500 mg via INTRAVENOUS
  Filled 2016-07-18: qty 500

## 2016-07-18 MED ORDER — LIDOCAINE HCL 1 % IJ SOLN
INTRAMUSCULAR | Status: DC | PRN
  Administered 2016-07-18: 10 mL

## 2016-07-18 MED ORDER — ARFORMOTEROL TARTRATE 15 MCG/2ML IN NEBU
15.0000 ug | INHALATION_SOLUTION | Freq: Two times a day (BID) | RESPIRATORY_TRACT | Status: DC
  Administered 2016-07-18 – 2016-07-20 (×5): 15 ug via RESPIRATORY_TRACT
  Filled 2016-07-18 (×5): qty 2

## 2016-07-18 MED ORDER — IPRATROPIUM-ALBUTEROL 0.5-2.5 (3) MG/3ML IN SOLN
3.0000 mL | Freq: Four times a day (QID) | RESPIRATORY_TRACT | Status: DC
  Administered 2016-07-18 – 2016-07-19 (×7): 3 mL via RESPIRATORY_TRACT
  Filled 2016-07-18 (×8): qty 3

## 2016-07-18 MED ORDER — ASPIRIN 81 MG PO CHEW
81.0000 mg | CHEWABLE_TABLET | Freq: Every day | ORAL | Status: DC
  Administered 2016-07-18 – 2016-07-19 (×2): 81 mg via ORAL
  Filled 2016-07-18 (×2): qty 1

## 2016-07-18 MED ORDER — SENNOSIDES-DOCUSATE SODIUM 8.6-50 MG PO TABS: 1.0000 | ORAL_TABLET | Freq: Every evening | ORAL | Status: DC | PRN

## 2016-07-18 MED ORDER — BUDESONIDE 0.5 MG/2ML IN SUSP
0.5000 mg | Freq: Two times a day (BID) | RESPIRATORY_TRACT | Status: DC
  Administered 2016-07-18 – 2016-07-20 (×5): 0.5 mg via RESPIRATORY_TRACT
  Filled 2016-07-18 (×5): qty 2

## 2016-07-18 MED ORDER — BUPROPION HCL ER (SR) 150 MG PO TB12
150.0000 mg | ORAL_TABLET | Freq: Two times a day (BID) | ORAL | Status: DC
  Administered 2016-07-18 – 2016-07-20 (×5): 150 mg via ORAL
  Filled 2016-07-18 (×6): qty 1

## 2016-07-18 MED ORDER — INSULIN ASPART 100 UNIT/ML ~~LOC~~ SOLN
0.0000 [IU] | Freq: Three times a day (TID) | SUBCUTANEOUS | Status: DC
  Administered 2016-07-18: 5 [IU] via SUBCUTANEOUS
  Administered 2016-07-18: 2 [IU] via SUBCUTANEOUS
  Administered 2016-07-18: 3 [IU] via SUBCUTANEOUS
  Administered 2016-07-19: 2 [IU] via SUBCUTANEOUS
  Administered 2016-07-19 – 2016-07-20 (×3): 1 [IU] via SUBCUTANEOUS

## 2016-07-18 MED ORDER — ACETAMINOPHEN 325 MG PO TABS
650.0000 mg | ORAL_TABLET | Freq: Four times a day (QID) | ORAL | Status: DC | PRN
  Administered 2016-07-18 – 2016-07-19 (×3): 650 mg via ORAL
  Filled 2016-07-18 (×3): qty 2

## 2016-07-18 MED ORDER — DEXTROSE 5 % IV SOLN
1.0000 g | Freq: Once | INTRAVENOUS | Status: AC
  Administered 2016-07-18: 1 g via INTRAVENOUS
  Filled 2016-07-18: qty 10

## 2016-07-18 MED ORDER — SODIUM CHLORIDE 0.9% FLUSH
3.0000 mL | Freq: Two times a day (BID) | INTRAVENOUS | Status: DC
  Administered 2016-07-18 – 2016-07-20 (×6): 3 mL via INTRAVENOUS

## 2016-07-18 MED ORDER — ENOXAPARIN SODIUM 30 MG/0.3ML ~~LOC~~ SOLN
30.0000 mg | SUBCUTANEOUS | Status: DC
  Administered 2016-07-18 – 2016-07-20 (×3): 30 mg via SUBCUTANEOUS
  Filled 2016-07-18 (×3): qty 0.3

## 2016-07-18 MED ORDER — PANTOPRAZOLE SODIUM 40 MG PO TBEC
40.0000 mg | DELAYED_RELEASE_TABLET | Freq: Every day | ORAL | Status: DC
  Administered 2016-07-18 – 2016-07-20 (×3): 40 mg via ORAL
  Filled 2016-07-18 (×3): qty 1

## 2016-07-18 MED ORDER — ALBUTEROL SULFATE (2.5 MG/3ML) 0.083% IN NEBU: 2.5000 mg | INHALATION_SOLUTION | RESPIRATORY_TRACT | Status: DC | PRN

## 2016-07-18 MED ORDER — LIDOCAINE HCL 1 % IJ SOLN
INTRAMUSCULAR | Status: AC
  Filled 2016-07-18: qty 20

## 2016-07-18 MED ORDER — VITAMIN B-12 1000 MCG PO TABS
1000.0000 ug | ORAL_TABLET | Freq: Every day | ORAL | Status: DC
  Administered 2016-07-18 – 2016-07-20 (×3): 1000 ug via ORAL
  Filled 2016-07-18 (×3): qty 1

## 2016-07-18 MED ORDER — GABAPENTIN 300 MG PO CAPS
300.0000 mg | ORAL_CAPSULE | Freq: Every day | ORAL | Status: DC
  Administered 2016-07-18 – 2016-07-19 (×2): 300 mg via ORAL
  Filled 2016-07-18 (×2): qty 1

## 2016-07-18 MED ORDER — ACETAMINOPHEN 650 MG RE SUPP: 650.0000 mg | Freq: Four times a day (QID) | RECTAL | Status: DC | PRN

## 2016-07-18 MED ORDER — ROSUVASTATIN CALCIUM 20 MG PO TABS
20.0000 mg | ORAL_TABLET | Freq: Every day | ORAL | Status: DC
  Administered 2016-07-18 – 2016-07-19 (×2): 20 mg via ORAL
  Filled 2016-07-18 (×2): qty 1

## 2016-07-18 NOTE — Progress Notes (Signed)
Spoke to Dr. Rex Kras and accepted transfer of Ms. Solarz to [facility]. Awaiting bed availability.

## 2016-07-18 NOTE — H&P (Signed)
Date: 07/18/2016               Patient Name:  [name] MRN: [id number]  DOB: Nov 17, 1955 Age / Sex: 61 y.o., female   PCP: Oval Linsey, MD         Medical Service: Internal Medicine Teaching Service         Attending Physician: Dr. Aldine Contes, MD    First Contact: Dr. Allegra Grana Pager: [phone]  Second Contact: Dr. Zada Finders Pager: [phone]       After Hours (After 5p/  First Contact Pager: [phone]  weekends / holidays): Second Contact Pager: [phone]   Chief Complaint: Shortness of breath  History of Present Illness:  Patient is a 61 y.o. 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   The patient was in her usual state of health until approximately 3 days ago when she began to develop increasing dyspnea on exertion and dyspnea at rest. This progressed over the last 48 hours prompting her to present to the emergency department. She says she gets extremely winded even when walking very short distances which is new for her. She was unable to use her breathing treatments this afternoon because the power at her house when out. She does not have increasing cough or sputum production. Associated with this shortness of breath she does have some chest pressure. She denies chest pain. She denies nausea, vomiting or abdominal pain. She denies polyuria or dysuria. She denies diarrhea or constipation. She denies fevers, chills, night sweats or weight loss.  In the emergency department the patient was normotensive and afebrile. She was tachypnic with a respiratory rate in the low 20s and satting 90% on 4 L. Basic metabolic panel showed creatinine 3.31 which is consistent with her most recent creatinine 1 month ago. CBC with differential showed a leukocytosis with a white blood cell count 12.1 and anemia with a hemoglobin of 9.2.  BNP normal. I-STAT troponin negative. Chest x-ray shows left pleural effusion with basilar atelectasis and consolidation unchanged from prior imaging. Additional area of atelectasis or infiltration found the right lung base. The patient was given albuterol nebulizer solution and treated with IV ceftriaxone and azithromycin. She was then admitted to the internal medicine teaching service for further management. Meds:  Current Meds  Medication Sig  . albuterol (VENTOLIN HFA) 108 (90 Base) MCG/ACT inhaler Inhale 1-2 puffs into the lungs every 6 (six) hours as needed for shortness of breath.  Marland Kitchen arformoterol (BROVANA) 15 MCG/2ML NEBU Take 2 mLs (15 mcg total) by nebulization 2 (two) times daily.  Marland Kitchen aspirin 81 MG chewable tablet Chew 1 tablet (81 mg total) by mouth at bedtime.  . budesonide (PULMICORT) 0.5 MG/2ML nebulizer solution Take 2 mLs (0.5 mg total) by nebulization 2 (two) times daily.  Marland Kitchen buPROPion (WELLBUTRIN SR) 150 MG 12 hr tablet Take 1 tablet (150 mg total) by mouth 2 (two) times daily.  . furosemide (LASIX) 40 MG tablet Take 80 mg by mouth daily.  Marland Kitchen gabapentin (NEURONTIN) 300 MG capsule Take 1 capsule (300 mg total) by mouth 3 (three) times daily. (Patient taking differently: Take 300 mg by mouth at bedtime. )  . lisinopril (PRINIVIL,ZESTRIL) 5 MG tablet Take 5 mg by mouth at bedtime.  . metFORMIN (GLUCOPHAGE) 500 MG tablet Take 1 tablet (500 mg total)  by mouth 2 (two) times daily with a meal.  . metolazone (ZAROXOLYN) 2.5 MG tablet TAKE 1 TABLET BY MOUTH AS NEEDED(FOR 5 LBS WEIGHT GAIN)  . pantoprazole (PROTONIX) 40 MG tablet Take 1 tablet (40 mg total) by mouth daily.  . rosuvastatin (CRESTOR) 20 MG tablet Take 1 tablet (20 mg total) by mouth daily.  Marland Kitchen umeclidinium bromide (INCRUSE ELLIPTA) 62.5 MCG/INH AEPB Inhale 1 puff into the lungs daily.  . vitamin B-12 (CYANOCOBALAMIN) 1000 MCG tablet Take 1 tablet (1,000 mcg total) by mouth daily.     Allergies: Allergies as of 07/17/2016 -  Review Complete 07/17/2016  Allergen Reaction Noted  . Fluconazole Anaphylaxis and Itching   . Atorvastatin Other (See Comments) 09/09/2014   Past Medical History:  Diagnosis Date  . Arthritis    "some scattered" (03/03/2015)  . CHF (congestive heart failure) ([place])   . Chronic bronchitis ([place])    "get it most years" (03/03/2015)  . COPD (chronic obstructive pulmonary disease) (H[facility])    Severe. Gold Stage IV.  PFTs (12/2008) - severe obstructive airway disease. Active tobacco use. Requires 4L O2 at home.  . Coronary artery disease    S/P PCI of LAD with DES (12/2008). Total occlusion of RCA noted at that time., medically managed. ACS ruled out 03/2009 with Lexiscan myoview . Followed by [facility].  . Depression   . Diastolic dysfunction    2-D Echo (12/2008) - Normal LV Systolic funciton with EF 60-65%. Grade 1 diastolid dysfunction. No regional wall motion abnormalities. Moderate pulmonary HTN with PA peak pressure 73mHg.  . Full dentures   . GERD (gastroesophageal reflux disease)    S/P Nissen fundoplication.  .Marland KitchenHistory of hiatal hernia   . Hx MRSA infection    Recurrent MRSA thigh abscesses.  . Hyperlipidemia   . Lung cancer ([place]    "[place]"  . Obesity   . On home oxygen therapy since 2010   4L all the time  . Pneumonia   . Prediabetes    HgbA1c 6.4 (12/2008)  . Pulmonary hypertension ([facility]    2-D Echo (091/4782 - Systolic pressure was moderately increased. PA peak pressure  533mg. 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 Followed by Dr. WrJoya GaskinsLVelora Heckler . Pulmonary nodule, right    Small right middle lobe nodule. Stable as of 12/2008.  . Marland Kitchenhortness of breath dyspnea    with a lot of exertion; if fluid builds up    Family History:  Family History  Problem Relation Age of Onset  . Heart disease Mother 4786  Deceased from MI at 62yo. Hypertension Mother   . Heart  disease Father 5435  Deceased of MI age 62yo. Hypertension Father   . Hypertension Brother   . Lung cancer      Grandmother     Social History:  Social History   Social History  . Marital status: Divorced    Spouse name: N/A  . Number of children: N/A  . Years of education: N/A   Occupational History  . Not on file.   Social History Main Topics  . Smoking status: Former Smoker    Packs/day: 1.00    Years: 45.00    Types: Cigarettes    Start date: 04/04/1969    Quit date: 11/04/2015  . Smokeless tobacco: Never Used     Comment: 1 cig per day  .  Alcohol use No  . Drug use: No  . Sexual activity: Not Currently    Birth control/ protection: None   Other Topics Concern  . Not on file   Social History Narrative   Formerly worked as a Scientist, water quality, now disabled.   Divorced.   2 grown children.   Lives with her grandson.     Review of Systems: A complete ROS was negative except as per HPI.   Physical Exam: Blood pressure 121/71, pulse 98, temperature 98.7 F (37.1 C), temperature source Oral, resp. rate 16, SpO2 90 %. Physical Exam  Constitutional: She is oriented to person, place, and time.  Morbidly obese, appears older than stated age  HENT:  Nasal cannula in place  Cardiovascular: Normal rate.   Heart sounds distant and difficult to auscultate  Respiratory: She has wheezes.  Tachypnea, decreased air movement, mild wheezes appreciated anteriorly  GI: Soft. Bowel sounds are normal. She exhibits no distension. There is no tenderness.  Musculoskeletal: She exhibits edema.  Mild bilateral lower extremity edema  Neurological: She is alert and oriented to person, place, and time.     EKG: Sinus rhythm, low voltage  CXR: Left pleural effusion with basilar atelectasis and consolidation unchanged from prior imaging. Increasing atelectasis or infiltration in the right lung base.  Assessment & Plan by Problem: Active Problems:   Acute on chronic respiratory failure  (HCC)  The patient is a morbidly obese 61 year old female with multiple comorbidities including oxygen dependent COPD who presents with acute on chronic hypoxic respiratory failure   # Acute on chronic hypoxic respiratory failure ## Oxygen dependent COPD ## Pulmonary hypertension Patient presents with a 72 hour history of progressive dyspnea on exertion and dyspnea at rest. She has an increasing oxygen requirement from 4 L at home to 5 L currently. Chest radiograph shows unchanged left sided pleural effusion with potential new right-sided infiltrate versus atelectasis. Volume examination is tenuous but she does not seem volume overloaded. Differential diagnosis is broad but includes COPD exacerbation, community-acquired pneumonia, congestive heart failure exacerbation or other etiologies. Patient seemed to have some confusion regarding which medications she is to take for her COPD. I feel heart failure is unlikely as she appears close to euvolemic on examination. The patient is afebrile gut she does have a leukocytosis and a potential infiltrate which could be evidence of an underlying pneumonia. For now we will continue with antimicrobial therapy and diuresis and monitor the patient overnight. 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 She may benefit from diagnostic/therapeutic thoracentesis or potential consult to CVTS for PleurX  catheter placement.  DVT/PE prophylaxis: Lovenox FEN/GI: Carb modified Code: Full code  Dispo: Admit patient to Observation with expected length of stay less than 2 midnights.  Signed: Ophelia Shoulder, MD 07/18/2016, 2:45 AM  Pager: [phone]

## 2016-07-18 NOTE — Progress Notes (Signed)
   Subjective: Patient feels that her breathing is improved today. She feels that the breathing treatments have helped her the most.   Objective:  Vital signs in last 24 hours: Vitals:   07/18/16 0740 07/18/16 0744 07/18/16 0800 07/18/16 0900  BP:  110/66    Pulse:  95 92 94  Resp:  _0 Temp:  98 F (36.7 C)    TempSrc:  Oral    SpO2: 91% 97% 93% 93%  Weight:      Height:       Physical Exam Constitutional: Obese, NAD, appears comfortable HEENT: Atraumatic, normocephalic. PERRL, anicteric sclera.  Neck: Supple, trachea midline.  Cardiovascular: RRR, no murmurs, rubs, or gallops.  Pulmonary/Chest: CTAB, no wheezes, rales, or rhonchi. Decreased breaths sounds at base of left lower lobe.  Abdominal: Soft, non tender, non distended. +BS.  Extremities: Warm and well perfused. No edema.  Neurological: A&Ox3, CN II - XII grossly intact.  Skin: No rashes or erythema  Psychiatric: Normal mood and affect   Assessment/Plan:  Patient is a 61 yo F with pmhx significant for oxygen dependent COPD (4L) and non-small cell lung cancer, adenocarcinoma s/p curative radiotherapy in August of 2016 admitted for 3 days of worsening shortness of breath.   Shortness of Breath: Likely multifactorial due to COPD vs. CHF vs. Tumor recurrance vs. Inhaler non compliance. Patient presented with 3 days of worsening shortness of breath and progressive dyspnea now at rest. She had an increasing oxygen requirement from 4L at home to 5L here. There was some question on admission regarding medication compliance as she was unable to report what inhalers she used. CXR showed unchanged left sided pleural effusion and potential new right-sided infiltrate vs. atelectasis. CT chest showed increase in size of her left-sided pleural as well as pleural thickening. Findings may represent post therapy changes vs. A superimposed infectious infiltrate. However, patient has no cough, is afebrile, with only a mild leukocytosis  of 12. She received a dose of ceftriaxone and azithromycin in the ED, antibiotics were held on admission. She was also noted to have progressive pleural based nodularity at the left lung apex measuring 9.4 mm concerning for the spread of her tumor. Dr. Posey Pronto has discussed these findings with her oncologist Dr. Julien Nordmann. Will plan for diagnostic and therapeutic thoracentesis and send fluid for cytology analysis.  -- S/p 40 mg IV lasix x 1 in ED -- S/p Ceftriaxone and azithro x 1 in ED  -- Continue Duonebs q6hours -- Continue albuterol nebs q2 prn  -- US guided thorcentesis -- F/u fluid analysis, cultures, and cytology  -- Will plan for outpatient PET scan and follow up with Dr. Julien Nordmann on discharge   HFrEF: Secondary to ischemic cardiomyopathy.  -- Continue Aspirin 81 mg daily  -- Rosuvastatin 20 mg daily  -- S/p IV lasix 80 mg x 1 in ED  DM Type II: -- SSI -- Gabapentin 300 mg once daily   FEN: No fluids, replete lytes prn, carb mod VTE ppx: Lovenox  Code Status: FULL   Dispo: Anticipated discharge in approximately 1-2 day(s).   Velna Ochs, MD 07/18/2016, 11:04 AM Pager: [phone]

## 2016-07-18 NOTE — Progress Notes (Signed)
SATURATION QUALIFICATIONS: (This note is used to comply with regulatory documentation for home oxygen)    Patient Saturations on 6 Liters of oxygen while Ambulating = 83%  Please briefly explain why patient needs home oxygen:  Patient SOB and hypoxic ambulating on [place], [place] 07/18/2016

## 2016-07-18 NOTE — Evaluation (Signed)
Physical Therapy Evaluation Patient Details Name: [name] MRN: [id number] DOB: [birth year] Today's Date: 07/18/2016   History of Present Illness  Patient is a 61 y.o. 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   Clinical Impression  Patient presents with decreased independence with mobility due to weakness from decreased activity as SOB over past few days.  Feel patient will benefit from skilled PT in the acute setting to allow return home with family support and likely not to need follow up skilled PT.      Follow Up Recommendations No PT follow up    Equipment Recommendations  None recommended by PT    Recommendations for Other Services       Precautions / Restrictions Precautions Precautions: Fall;Other (comment) Precaution Comments: watch O2      Mobility  Bed Mobility   Bed Mobility: Supine to Sit     Supine to sit: Modified independent (Device/Increase time)        Transfers Overall transfer level: Needs assistance Equipment used: Rolling walker (2 wheeled) Transfers: Sit to/from Stand Sit to Stand: Supervision         General transfer comment: for safety  Ambulation/Gait Ambulation/Gait assistance: Supervision Ambulation Distance (Feet): 100 Feet Assistive device: Rolling walker (2 wheeled) Gait Pattern/deviations: Step-through pattern;Decreased stride length     General Gait Details: moves walker well, but fatigued and SOB.  Amb on 6L O2 with SpO2 90% after ambulation then dropped to 83% then back up to 91% in 2 minutes  Stairs            Wheelchair Mobility    Modified Rankin (Stroke Patients Only)       Balance Overall balance assessment: Needs assistance   Sitting balance-Leahy Scale: Good       Standing balance-Leahy Scale: Fair Standing balance comment: weak, but can stand  statically without UE support                             Pertinent Vitals/Pain Pain Assessment: No/denies pain    Home Living Family/patient expects to be discharged to:: Private residence Living Arrangements: Children (grandson) Available Help at Discharge: Family;Available PRN/intermittently Type of Home: Apartment Home Access: Level entry     Home Layout: One level Home Equipment: Walker - 2 wheels;Shower seat;Hand held shower head      Prior Function Level of Independence: Independent;Independent with assistive device(s)         Comments: uses walker in the community, no device in the home, one recent fall when getting up from sitting, eventually made her way to the couch     Hand Dominance   Dominant Hand: Right    Extremity/Trunk Assessment   Upper Extremity Assessment Upper Extremity Assessment: Generalized weakness    Lower Extremity Assessment Lower Extremity Assessment: Generalized weakness       Communication   Communication: No difficulties  Cognition Arousal/Alertness: Awake/alert Behavior During Therapy: WFL for tasks assessed/performed Overall Cognitive Status: Within Functional Limits for tasks assessed                                        General Comments      Exercises  Assessment/Plan    PT Assessment Patient needs continued PT services  PT Problem List Decreased strength;Decreased mobility;Cardiopulmonary status limiting activity;Decreased activity tolerance;Decreased balance       PT Treatment Interventions DME instruction;Gait training;Therapeutic exercise;Functional mobility training;Balance training;Therapeutic activities    PT Goals (Current goals can be found in the Care Plan section)  Acute Rehab PT Goals Patient Stated Goal: To return home PT Goal Formulation: With patient Time For Goal Achievement: 07/22/16 Potential to Achieve Goals: Good    Frequency Min 3X/week   Barriers to  discharge        Co-evaluation               End of Session Equipment Utilized During Treatment: Gait belt;Oxygen Activity Tolerance: Patient limited by fatigue;Treatment limited secondary to medical complications (Comment) (hypoxia on 6L O2) Patient left: in bed;with call bell/phone within reach;with nursing/sitter in room   PT Visit Diagnosis: Muscle weakness (generalized) (M62.81);Difficulty in walking, not elsewhere classified (R26.2)    Time: 9038-3338 PT Time Calculation (min) (ACUTE ONLY): 28 min   Charges:   PT Evaluation $PT Eval Moderate Complexity: 1 Procedure PT Treatments $Gait Training: 8-22 mins   PT G Codes:   PT G-Codes **NOT FOR INPATIENT CLASS** Functional Assessment Tool Used: AM-PAC 6 Clicks Basic Mobility Functional Limitation: Mobility: Walking and moving around Mobility: Walking and Moving Around Current Status (V2919): At least 20 percent but less than 40 percent impaired, limited or restricted Mobility: Walking and Moving Around Goal Status [id number]): At least 1 percent but less than 20 percent impaired, limited or restricted    [place], [place] 07/18/2016   [name] 07/18/2016, 1:26 PM

## 2016-07-19 DIAGNOSIS — J449 Chronic obstructive pulmonary disease, unspecified: Secondary | ICD-10-CM | POA: Diagnosis not present

## 2016-07-19 DIAGNOSIS — I2510 Atherosclerotic heart disease of native coronary artery without angina pectoris: Secondary | ICD-10-CM | POA: Diagnosis present

## 2016-07-19 DIAGNOSIS — Z9981 Dependence on supplemental oxygen: Secondary | ICD-10-CM | POA: Diagnosis not present

## 2016-07-19 DIAGNOSIS — I255 Ischemic cardiomyopathy: Secondary | ICD-10-CM | POA: Diagnosis not present

## 2016-07-19 DIAGNOSIS — J189 Pneumonia, unspecified organism: Secondary | ICD-10-CM | POA: Diagnosis present

## 2016-07-19 DIAGNOSIS — Z9889 Other specified postprocedural states: Secondary | ICD-10-CM | POA: Diagnosis not present

## 2016-07-19 DIAGNOSIS — E119 Type 2 diabetes mellitus without complications: Secondary | ICD-10-CM

## 2016-07-19 DIAGNOSIS — Z888 Allergy status to other drugs, medicaments and biological substances status: Secondary | ICD-10-CM | POA: Diagnosis not present

## 2016-07-19 DIAGNOSIS — N189 Chronic kidney disease, unspecified: Secondary | ICD-10-CM | POA: Diagnosis not present

## 2016-07-19 DIAGNOSIS — Z972 Presence of dental prosthetic device (complete) (partial): Secondary | ICD-10-CM | POA: Diagnosis not present

## 2016-07-19 DIAGNOSIS — Z794 Long term (current) use of insulin: Secondary | ICD-10-CM | POA: Diagnosis not present

## 2016-07-19 DIAGNOSIS — Z6841 Body Mass Index (BMI) 40.0 and over, adult: Secondary | ICD-10-CM | POA: Diagnosis not present

## 2016-07-19 DIAGNOSIS — Z923 Personal history of irradiation: Secondary | ICD-10-CM | POA: Diagnosis not present

## 2016-07-19 DIAGNOSIS — E1142 Type 2 diabetes mellitus with diabetic polyneuropathy: Secondary | ICD-10-CM | POA: Diagnosis present

## 2016-07-19 DIAGNOSIS — R918 Other nonspecific abnormal finding of lung field: Secondary | ICD-10-CM | POA: Diagnosis not present

## 2016-07-19 DIAGNOSIS — J948 Other specified pleural conditions: Secondary | ICD-10-CM | POA: Diagnosis not present

## 2016-07-19 DIAGNOSIS — R0600 Dyspnea, unspecified: Secondary | ICD-10-CM | POA: Diagnosis present

## 2016-07-19 DIAGNOSIS — F329 Major depressive disorder, single episode, unspecified: Secondary | ICD-10-CM | POA: Diagnosis present

## 2016-07-19 DIAGNOSIS — R911 Solitary pulmonary nodule: Secondary | ICD-10-CM | POA: Diagnosis present

## 2016-07-19 DIAGNOSIS — Z85118 Personal history of other malignant neoplasm of bronchus and lung: Secondary | ICD-10-CM | POA: Diagnosis not present

## 2016-07-19 DIAGNOSIS — E1122 Type 2 diabetes mellitus with diabetic chronic kidney disease: Secondary | ICD-10-CM | POA: Diagnosis present

## 2016-07-19 DIAGNOSIS — J90 Pleural effusion, not elsewhere classified: Secondary | ICD-10-CM | POA: Diagnosis not present

## 2016-07-19 DIAGNOSIS — N184 Chronic kidney disease, stage 4 (severe): Secondary | ICD-10-CM | POA: Diagnosis present

## 2016-07-19 DIAGNOSIS — J9811 Atelectasis: Secondary | ICD-10-CM | POA: Diagnosis present

## 2016-07-19 DIAGNOSIS — J440 Chronic obstructive pulmonary disease with acute lower respiratory infection: Secondary | ICD-10-CM | POA: Diagnosis present

## 2016-07-19 DIAGNOSIS — I5032 Chronic diastolic (congestive) heart failure: Secondary | ICD-10-CM | POA: Diagnosis not present

## 2016-07-19 DIAGNOSIS — J9621 Acute and chronic respiratory failure with hypoxia: Secondary | ICD-10-CM | POA: Diagnosis present

## 2016-07-19 DIAGNOSIS — I130 Hypertensive heart and chronic kidney disease with heart failure and stage 1 through stage 4 chronic kidney disease, or unspecified chronic kidney disease: Secondary | ICD-10-CM | POA: Diagnosis present

## 2016-07-19 DIAGNOSIS — I959 Hypotension, unspecified: Secondary | ICD-10-CM | POA: Diagnosis not present

## 2016-07-19 DIAGNOSIS — K219 Gastro-esophageal reflux disease without esophagitis: Secondary | ICD-10-CM | POA: Diagnosis present

## 2016-07-19 DIAGNOSIS — Z801 Family history of malignant neoplasm of trachea, bronchus and lung: Secondary | ICD-10-CM | POA: Diagnosis not present

## 2016-07-19 DIAGNOSIS — I2729 Other secondary pulmonary hypertension: Secondary | ICD-10-CM | POA: Diagnosis present

## 2016-07-19 DIAGNOSIS — E785 Hyperlipidemia, unspecified: Secondary | ICD-10-CM | POA: Diagnosis present

## 2016-07-19 DIAGNOSIS — I5042 Chronic combined systolic (congestive) and diastolic (congestive) heart failure: Secondary | ICD-10-CM | POA: Diagnosis present

## 2016-07-19 LAB — CBC
HCT: 30.9 % — ABNORMAL LOW (ref 36.0–46.0)
Hemoglobin: 9.1 g/dL — ABNORMAL LOW (ref 12.0–15.0)
MCH: 28.4 pg (ref 26.0–34.0)
MCHC: 29.4 g/dL — AB (ref 30.0–36.0)
MCV: 96.6 fL (ref 78.0–100.0)
PLATELETS: 235 10*3/uL (ref 150–400)
RBC: 3.2 MIL/uL — AB (ref 3.87–5.11)
RDW: 13.7 % (ref 11.5–15.5)
WBC: 16.2 10*3/uL — AB (ref 4.0–10.5)

## 2016-07-19 LAB — BASIC METABOLIC PANEL
Anion gap: 10 (ref 5–15)
BUN: 38 mg/dL — AB (ref 6–20)
CALCIUM: 8.7 mg/dL — AB (ref 8.9–10.3)
CO2: 33 mmol/L — ABNORMAL HIGH (ref 22–32)
CREATININE: 3.27 mg/dL — AB (ref 0.44–1.00)
Chloride: 96 mmol/L — ABNORMAL LOW (ref 101–111)
GFR, EST AFRICAN AMERICAN: 16 mL/min — AB (ref >60)
GFR, EST NON AFRICAN AMERICAN: 14 mL/min — AB (ref >60)
Glucose, Bld: 122 mg/dL — ABNORMAL HIGH (ref 65–99)
Potassium: 4.6 mmol/L (ref 3.5–5.1)
Sodium: 139 mmol/L (ref 135–145)

## 2016-07-19 LAB — GLUCOSE, CAPILLARY
GLUCOSE-CAPILLARY: 124 mg/dL — AB (ref 65–99)
GLUCOSE-CAPILLARY: 190 mg/dL — AB (ref 65–99)
GLUCOSE-CAPILLARY: 171 mg/dL — AB (ref 65–99)
Glucose-Capillary: 143 mg/dL — ABNORMAL HIGH (ref 65–99)

## 2016-07-19 LAB — PH, BODY FLUID: pH, Body Fluid: 7.8

## 2016-07-19 LAB — AMYLASE, PLEURAL OR PERITONEAL FLUID: Amylase, Fluid: 87 U/L

## 2016-07-19 LAB — HEMOGLOBIN A1C
Hgb A1c MFr Bld: 5.8 % — ABNORMAL HIGH (ref 4.8–5.6)
MEAN PLASMA GLUCOSE: 120 mg/dL

## 2016-07-19 LAB — VITAMIN D 25 HYDROXY (VIT D DEFICIENCY, FRACTURES): VIT D 25 HYDROXY: 23.5 ng/mL — AB (ref 30.0–100.0)

## 2016-07-19 MED ORDER — AZITHROMYCIN 500 MG PO TABS
250.0000 mg | ORAL_TABLET | Freq: Every day | ORAL | Status: DC
  Administered 2016-07-20: 250 mg via ORAL
  Filled 2016-07-19: qty 1

## 2016-07-19 MED ORDER — DEXTROSE 5 % IV SOLN
1.0000 g | INTRAVENOUS | Status: DC
  Administered 2016-07-19 – 2016-07-20 (×2): 1 g via INTRAVENOUS
  Filled 2016-07-19 (×2): qty 10

## 2016-07-19 MED ORDER — AZITHROMYCIN 500 MG PO TABS
500.0000 mg | ORAL_TABLET | Freq: Every day | ORAL | Status: AC
  Administered 2016-07-19: 500 mg via ORAL
  Filled 2016-07-19: qty 1

## 2016-07-19 MED ORDER — IPRATROPIUM-ALBUTEROL 0.5-2.5 (3) MG/3ML IN SOLN
3.0000 mL | Freq: Three times a day (TID) | RESPIRATORY_TRACT | Status: DC
  Administered 2016-07-20 (×2): 3 mL via RESPIRATORY_TRACT
  Filled 2016-07-19 (×2): qty 3

## 2016-07-19 NOTE — Care Management Obs Status (Signed)
[place] NOTIFICATION   Patient Details  Name: VANETTE NOGUCHI MRN: [id number] Date of Birth: 09/05/1955   Medicare Observation Status Notification Given:  Yes    Zenon Mayo, RN 07/19/2016, 6:15 PM

## 2016-07-19 NOTE — Care Management Note (Addendum)
Case Management Note  Patient Details  Name: [name] MRN: [id number] Date of Birth: May 28, 1955  Subjective/Objective:     From home with grandson who is 59,  She has a PCP, and medication  Coverage and she has transportation at [employer].   She has copd, chf,  Present with resp failure, She is on 4 liters oxygen at home with [facility] , but she can not afford to pay them anymore since she got behind.  She would like to get oxygen with a new DME company, she states she would like [employer].  We will need to get oxygen saturation results.  NCM will follow for dc needs.                Action/Plan:   Expected Discharge Date:  07/21/16               Expected Discharge Plan:  Home/Self Care  In-House Referral:     Discharge planning Services  CM Consult  Post Acute Care Choice:    Choice offered to:  Patient  DME Arranged:    DME Agency:     HH Arranged:    [place] Agency:     Status of Service:  In process, will continue to follow  If discussed at Long Length of Stay Meetings, dates discussed:    Additional Comments:  Zenon Mayo, RN 07/19/2016, 5:16 PM
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
## 2016-07-19 NOTE — Progress Notes (Signed)
Internal Medicine Attending:   I saw and examined the patient. I reviewed the resident's note and I agree with the resident's findings and plan as documented in the resident's note.  Patient feels well this morning and states her shortness of breath has improved and that her breathing is now at baseline. She is now status post thoracentesis yesterday with removal of approximately 700 mL of pleural fluid. Pleural fluid was consistent with an exudate based on lites criteria. Patient's oxygen requirements are now improved and she is on her home oxygen dose of 4 L. Of note, today she is noted to have worsening leukocytosis up to 16 and her blood pressures are now borderline low. Given these findings I am inclined to treat the patient with antibiotics for possible underlying pneumonia with IV ceftriaxone and azithromycin. We will follow-up pleural fluid cultures and cytology. Patient also noted to have progressive pleural-based nodularity at the left lung apex which may represent spread of her tumor. These findings were discussed with Dr. Earlie Server who is her oncologist. She will need an outpatient PET scan to further work this up.

## 2016-07-20 DIAGNOSIS — N189 Chronic kidney disease, unspecified: Secondary | ICD-10-CM

## 2016-07-20 DIAGNOSIS — J9621 Acute and chronic respiratory failure with hypoxia: Principal | ICD-10-CM

## 2016-07-20 DIAGNOSIS — I959 Hypotension, unspecified: Secondary | ICD-10-CM

## 2016-07-20 LAB — BASIC METABOLIC PANEL
Anion gap: 6 (ref 5–15)
BUN: 37 mg/dL — ABNORMAL HIGH (ref 6–20)
CALCIUM: 8.9 mg/dL (ref 8.9–10.3)
CHLORIDE: 96 mmol/L — AB (ref 101–111)
CO2: 37 mmol/L — ABNORMAL HIGH (ref 22–32)
CREATININE: 2.93 mg/dL — AB (ref 0.44–1.00)
GFR calc non Af Amer: 16 mL/min — ABNORMAL LOW (ref >60)
GFR, EST AFRICAN AMERICAN: 19 mL/min — AB (ref >60)
Glucose, Bld: 135 mg/dL — ABNORMAL HIGH (ref 65–99)
Potassium: 4.5 mmol/L (ref 3.5–5.1)
SODIUM: 139 mmol/L (ref 135–145)

## 2016-07-20 LAB — GRAM STAIN

## 2016-07-20 LAB — PROCALCITONIN: PROCALCITONIN: 0.13 ng/mL

## 2016-07-20 LAB — CBC
HEMATOCRIT: 29.1 % — AB (ref 36.0–46.0)
HEMOGLOBIN: 8.9 g/dL — AB (ref 12.0–15.0)
MCH: 30 pg (ref 26.0–34.0)
MCHC: 30.6 g/dL (ref 30.0–36.0)
MCV: 98 fL (ref 78.0–100.0)
Platelets: 207 10*3/uL (ref 150–400)
RBC: 2.97 MIL/uL — ABNORMAL LOW (ref 3.87–5.11)
RDW: 14.3 % (ref 11.5–15.5)
WBC: 11.1 10*3/uL — ABNORMAL HIGH (ref 4.0–10.5)

## 2016-07-20 LAB — GLUCOSE, CAPILLARY
GLUCOSE-CAPILLARY: 130 mg/dL — AB (ref 65–99)
Glucose-Capillary: 134 mg/dL — ABNORMAL HIGH (ref 65–99)

## 2016-07-20 MED ORDER — CEFDINIR 300 MG PO CAPS: 300.0000 mg | ORAL_CAPSULE | Freq: Every day | ORAL | 0 refills | Status: DC

## 2016-07-20 MED ORDER — FUROSEMIDE 80 MG PO TABS: 80.0000 mg | ORAL_TABLET | Freq: Every day | ORAL | Status: DC

## 2016-07-20 MED ORDER — AZITHROMYCIN 250 MG PO TABS: 250.0000 mg | ORAL_TABLET | Freq: Every day | ORAL | 0 refills | Status: DC

## 2016-07-20 MED ORDER — DICLOFENAC SODIUM 1 % TD GEL
2.0000 g | Freq: Four times a day (QID) | TRANSDERMAL | Status: DC | PRN
  Filled 2016-07-20: qty 100

## 2016-07-20 NOTE — Clinical Social Work Note (Signed)
CSW set up transportation with [name] through [facility] for her hospital follow up appointment at the hospital in the Internal Medicine Clinic on 07/22/2016 at 11:15 am. They will pick her up between 10:15 am and 10:45 am. [place] # is [phone]. Patient provided information on paper to take home with her as well as number to call when she is ready to return home. MD notified. Will notify RN.  Dayton Scrape, [place]
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
## 2016-07-20 NOTE — Progress Notes (Signed)
Physical Therapy Treatment Patient Details Name: [name] MRN: [id number] DOB: 1955/12/31 Today's Date: 07/20/2016    History of Present Illness Patient is a 61 y.o. 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     PT Comments    Patient seen for mobility progression. Continues to have fatigue, DOE and hypoxia on 6 liters supplemental O2. Patient rebounds with seated rest break to upper 80s. At this time, feel patient will need use of a 4 wheeled rollator walker with seat for mobility and energy conservation as patients LEs fatigue with hypoxia and patient requires seated rest. Will continue to see and progress as tolerated.   Follow Up Recommendations  No PT follow up     Equipment Recommendations   (4 wheeled ROLLATOR walker with seat)    Recommendations for Other Services       Precautions / Restrictions Precautions Precautions: Fall;Other (comment) Precaution Comments: watch O2 Restrictions Weight Bearing Restrictions: No    Mobility  Bed Mobility Overal bed mobility: Modified Independent                Transfers Overall transfer level: Needs assistance Equipment used: Rolling walker (2 wheeled) Transfers: Sit to/from Stand Sit to Stand: Supervision         General transfer comment: increased effort noted  Ambulation/Gait Ambulation/Gait assistance: Supervision Ambulation Distance (Feet): 120 Feet (extended standing rest break at 60 ft) Assistive device: Rolling walker (2 wheeled) Gait Pattern/deviations: Step-through pattern;Decreased stride length;Trunk flexed Gait velocity: decreased Gait velocity interpretation: Below normal speed for age/gender General Gait Details: DOE 3/4 with very minimal activity, desaturation to low 70s on 4 liters, increased to 6liters with rest break remains in 70s with good  pleth wave form. 2 minutes to rebound to upper 80s   Stairs            Wheelchair Mobility    Modified Rankin (Stroke Patients Only)       Balance Overall balance assessment: Needs assistance   Sitting balance-Leahy Scale: Good       Standing balance-Leahy Scale: Fair Standing balance comment: can stand without support when not SOB                            Cognition Arousal/Alertness: Awake/alert Behavior During Therapy: WFL for tasks assessed/performed Overall Cognitive Status: Within Functional Limits for tasks assessed                                        Exercises      General Comments        Pertinent Vitals/Pain Pain Assessment: No/denies pain    Home Living                      Prior Function            PT Goals (current goals can now be found in the care plan section) Acute Rehab PT Goals Patient Stated Goal: To return home PT Goal Formulation: With patient Time For Goal Achievement: 07/22/16 Potential to Achieve Goals: Good Progress towards PT goals: Progressing toward goals    Frequency    Min 3X/week      PT Plan  Current plan remains appropriate    Co-evaluation             End of Session Equipment Utilized During Treatment: Gait belt;Oxygen Activity Tolerance: Patient limited by fatigue;Treatment limited secondary to medical complications (Comment) (continues to have hypoxia on 6L O2) Patient left: in bed;with call bell/phone within reach (sitting EOB) Nurse Communication: Mobility status (hypoxia with mobility; 900 output urine) PT Visit Diagnosis: Muscle weakness (generalized) (M62.81);Difficulty in walking, not elsewhere classified (R26.2)     Time: 5672-0919 PT Time Calculation (min) (ACUTE ONLY): 17 min  Charges:  $Gait Training: 8-22 mins                    G Codes:  Functional Assessment Tool Used: AM-PAC 6 Clicks Basic Mobility Functional Limitation: Mobility: Walking  and moving around Mobility: Walking and Moving Around Current Status (C0221): At least 20 percent but less than 40 percent impaired, limited or restricted Mobility: Walking and Moving Around Goal Status [id number]): At least 1 percent but less than 20 percent impaired, limited or restricted    Alben Deeds, PT DPT  [place] 07/20/2016, 9:11 AM

## 2016-07-20 NOTE — Progress Notes (Signed)
Rollator delivered to pt's room, reviewed discharge instructions with pt including medications, follow up appts and when to call MD. Pt verbalizes understanding. Pt's grandson is picking her up and will be at the main entrance with O2 for pt to go home. Consuelo Pandy RN
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
## 2016-07-20 NOTE — Care Management Note (Addendum)
Case Management Note  Patient Details  Name: [name] MRN: [id number] Date of Birth: September 03, 1955  Subjective/Objective:    From home with grandson who is 54,  She has a PCP, and medication  Coverage and she has transportation at [employer].   She has copd, chf,  Present with resp failure, She is on 4 liters oxygen at home with [facility] , but she can not afford to pay them anymore since she got behind.  She would like to get oxygen with a new DME company, she states she would like [employer].  We will need to get oxygen saturation results.   4/18 [place], BSN - [facility] was unable to take referral for oxygen since patient is with [employer], they are billing her insurance so another oxygen company can not bill the insurance.  Patient will have to continue using [employer] and pay her bill with them. NCM called [employer] ,which is [employer] now, they said patient can call and speak with patient accounting to see if they could make a payment plan for her at [phone].  She states she has oxygen tanks at home and a concentrator.   She would like [facility] also for rollator, they will bring up to patient's room.                Action/Plan:   Expected Discharge Date:  07/20/16               Expected Discharge Plan:  Home/Self Care  In-House Referral:     Discharge planning Services  CM Consult  Post Acute Care Choice:    Choice offered to:  Patient  DME Arranged:  Gilford Rile rolling with seat DME Agency:  [place]:    [facility] Agency:     Status of Service:  Completed, signed off  If discussed at [place] of Stay Meetings, dates discussed:    Additional Comments:  Zenon Mayo, RN 07/20/2016, 12:35 PM
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
## 2016-07-22 ENCOUNTER — Ambulatory Visit: Payer: Commercial Managed Care - HMO | Admitting: Internal Medicine

## 2016-07-23 LAB — CULTURE, BODY FLUID-BOTTLE: CULTURE: NO GROWTH

## 2016-07-23 LAB — CULTURE, BODY FLUID W GRAM STAIN -BOTTLE

## 2016-07-28 IMAGING — CR DG CHEST 2V
2 series · 2 of 2 positions shown · non-contrast
Comparison: PA and lateral chest of November 25, 2013

CLINICAL DATA: Two-day history of shortness of breath; history of
CHF, coronary artery disease with stent placement, COPD, tobacco
use, and secondary pulmonary hypertension.

EXAM:
CHEST  2 VIEW

[w chest pa]
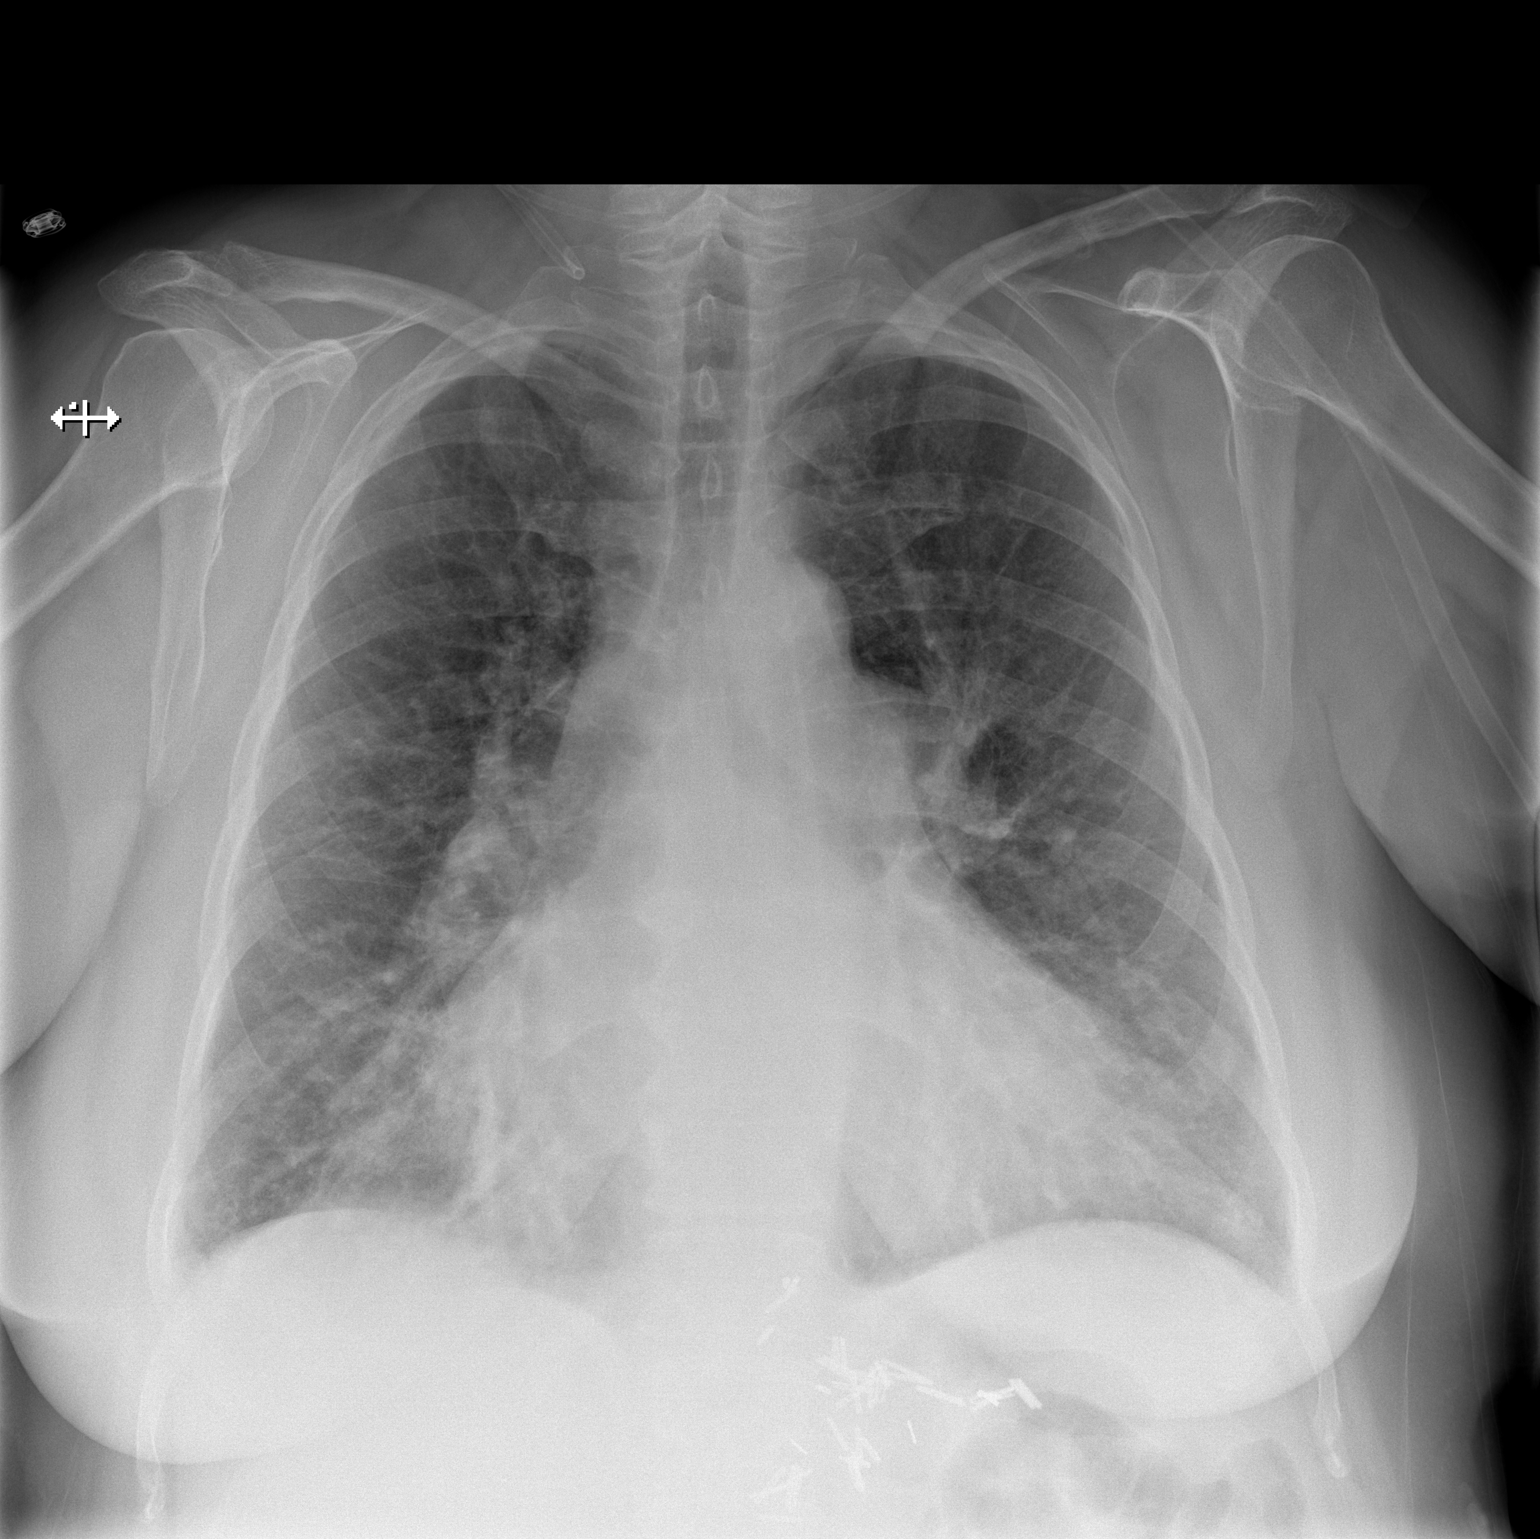

[w chest lat]
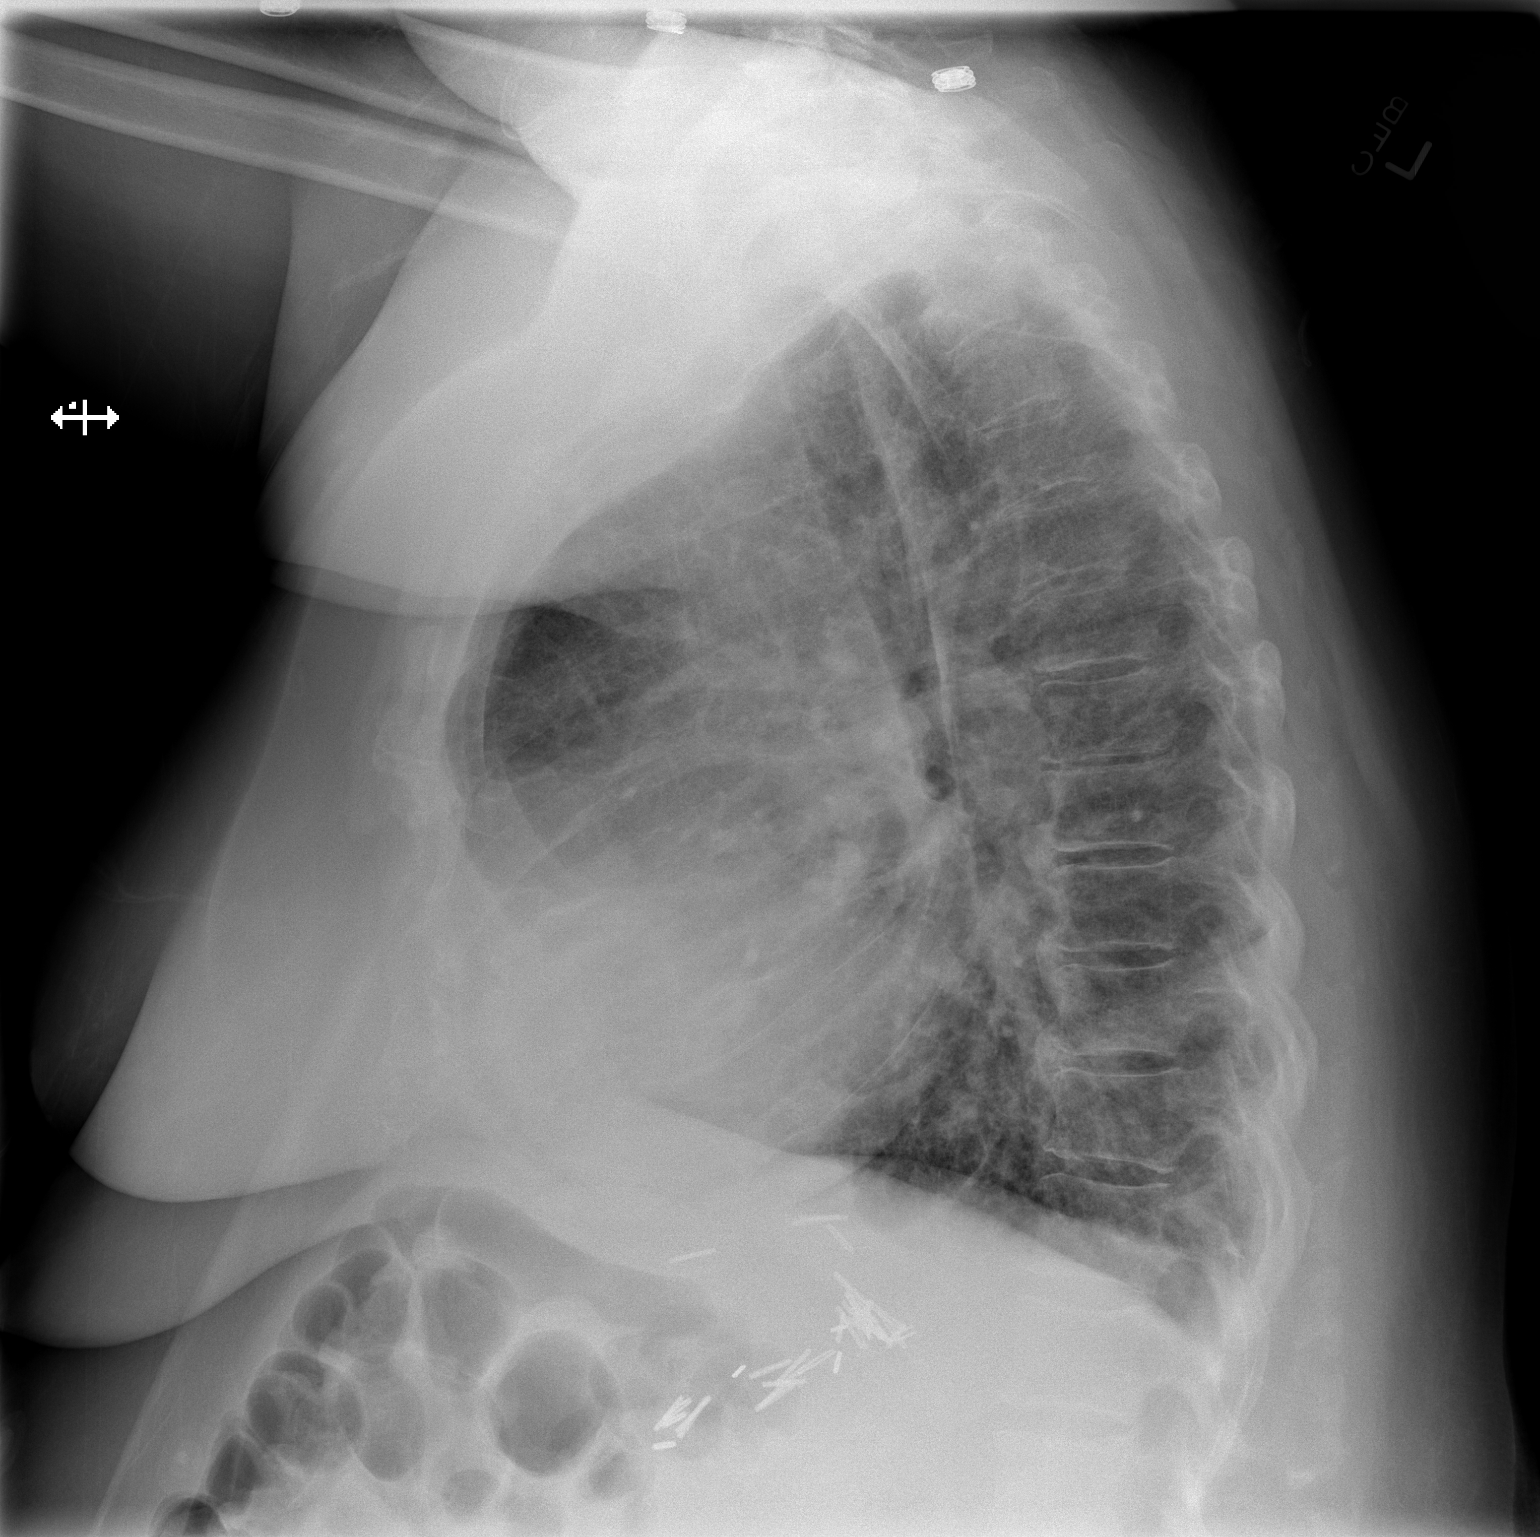

[2 of 2 positions shown; findings below may reference images not displayed]

FINDINGS: The cardiopericardial silhouette remains enlarged. The pulmonary
vascularity remains engorged but is slightly less conspicuous than
on the previous study. There is no pleural effusion or alveolar
infiltrate. The lungs are mildly hyperinflated. There is
hemidiaphragm flattening. The bony thorax is unremarkable.
IMPRESSION: CHF superimposed upon COPD. There is mild pulmonary interstitial
edema. There is no alveolar pneumonia.

## 2016-08-01 ENCOUNTER — Telehealth: Payer: Self-pay | Admitting: Internal Medicine

## 2016-08-01 NOTE — Telephone Encounter (Signed)
Scheduled appt per sch message from Rn Diane 4/25 - patient is aware of appt date and time - reminder letter sent in the mail.

## 2016-08-04 ENCOUNTER — Telehealth: Payer: Self-pay | Admitting: Internal Medicine

## 2016-08-04 DIAGNOSIS — J449 Chronic obstructive pulmonary disease, unspecified: Secondary | ICD-10-CM | POA: Diagnosis not present

## 2016-08-04 DIAGNOSIS — J9600 Acute respiratory failure, unspecified whether with hypoxia or hypercapnia: Secondary | ICD-10-CM | POA: Diagnosis not present

## 2016-08-04 DIAGNOSIS — R0902 Hypoxemia: Secondary | ICD-10-CM | POA: Diagnosis not present

## 2016-08-04 NOTE — Telephone Encounter (Signed)
APT. REMINDER CALL, NO ANSWER NO VOICEMAIL

## 2016-08-05 ENCOUNTER — Encounter: Payer: Self-pay | Admitting: Internal Medicine

## 2016-08-05 ENCOUNTER — Ambulatory Visit (INDEPENDENT_AMBULATORY_CARE_PROVIDER_SITE_OTHER): Payer: Medicare HMO | Admitting: Internal Medicine

## 2016-08-05 VITALS — BP 84/58 | HR 104 | Temp 99.7°F | Wt 262.4 lb

## 2016-08-05 DIAGNOSIS — I700 Atherosclerosis of aorta: Secondary | ICD-10-CM

## 2016-08-05 DIAGNOSIS — E538 Deficiency of other specified B group vitamins: Secondary | ICD-10-CM

## 2016-08-05 DIAGNOSIS — Z85118 Personal history of other malignant neoplasm of bronchus and lung: Secondary | ICD-10-CM

## 2016-08-05 DIAGNOSIS — E1159 Type 2 diabetes mellitus with other circulatory complications: Secondary | ICD-10-CM | POA: Diagnosis not present

## 2016-08-05 DIAGNOSIS — K5901 Slow transit constipation: Secondary | ICD-10-CM | POA: Diagnosis not present

## 2016-08-05 DIAGNOSIS — I2510 Atherosclerotic heart disease of native coronary artery without angina pectoris: Secondary | ICD-10-CM

## 2016-08-05 DIAGNOSIS — E1151 Type 2 diabetes mellitus with diabetic peripheral angiopathy without gangrene: Secondary | ICD-10-CM

## 2016-08-05 DIAGNOSIS — Z23 Encounter for immunization: Secondary | ICD-10-CM | POA: Diagnosis not present

## 2016-08-05 DIAGNOSIS — I872 Venous insufficiency (chronic) (peripheral): Secondary | ICD-10-CM | POA: Diagnosis not present

## 2016-08-05 DIAGNOSIS — K219 Gastro-esophageal reflux disease without esophagitis: Secondary | ICD-10-CM

## 2016-08-05 DIAGNOSIS — J449 Chronic obstructive pulmonary disease, unspecified: Secondary | ICD-10-CM | POA: Diagnosis not present

## 2016-08-05 DIAGNOSIS — Z87891 Personal history of nicotine dependence: Secondary | ICD-10-CM

## 2016-08-05 DIAGNOSIS — E785 Hyperlipidemia, unspecified: Secondary | ICD-10-CM

## 2016-08-05 DIAGNOSIS — Z7984 Long term (current) use of oral hypoglycemic drugs: Secondary | ICD-10-CM

## 2016-08-05 DIAGNOSIS — R6889 Other general symptoms and signs: Secondary | ICD-10-CM | POA: Diagnosis not present

## 2016-08-05 DIAGNOSIS — I5032 Chronic diastolic (congestive) heart failure: Secondary | ICD-10-CM

## 2016-08-05 DIAGNOSIS — Z9981 Dependence on supplemental oxygen: Secondary | ICD-10-CM

## 2016-08-05 DIAGNOSIS — Z0000 Encounter for general adult medical examination without abnormal findings: Secondary | ICD-10-CM

## 2016-08-05 DIAGNOSIS — E559 Vitamin D deficiency, unspecified: Secondary | ICD-10-CM

## 2016-08-05 DIAGNOSIS — I2723 Pulmonary hypertension due to lung diseases and hypoxia: Secondary | ICD-10-CM

## 2016-08-05 DIAGNOSIS — I272 Pulmonary hypertension, unspecified: Secondary | ICD-10-CM

## 2016-08-05 DIAGNOSIS — G63 Polyneuropathy in diseases classified elsewhere: Secondary | ICD-10-CM

## 2016-08-05 MED ORDER — ACETAMINOPHEN-CODEINE #3 300-30 MG PO TABS: 1.0000 | ORAL_TABLET | Freq: Four times a day (QID) | ORAL | 0 refills | Status: DC | PRN

## 2016-08-05 MED ORDER — DOCUSATE SODIUM 50 MG PO CAPS: 50.0000 mg | ORAL_CAPSULE | Freq: Two times a day (BID) | ORAL | 0 refills | Status: DC

## 2016-08-05 MED ORDER — GABAPENTIN 300 MG PO CAPS: 600.0000 mg | ORAL_CAPSULE | Freq: Every day | ORAL | 3 refills | Status: AC

## 2016-08-05 MED ORDER — CYANOCOBALAMIN 1000 MCG/ML IJ SOLN
1000.0000 ug | Freq: Once | INTRAMUSCULAR | Status: AC
  Administered 2016-08-05: 1000 ug via INTRAMUSCULAR

## 2016-08-05 MED ORDER — RANITIDINE HCL 300 MG PO TABS: 300.0000 mg | ORAL_TABLET | Freq: Every day | ORAL | 3 refills | Status: AC

## 2016-08-05 NOTE — Assessment & Plan Note (Signed)
Assessment  She notes continued difficulties with constipation and is currently not on any therapy specifically for this. I suspect it may be related to dehydration and poor mobility leading to slow transit.  Plan  We will start Colace 50 mg by mouth twice daily and reassess the degree of her constipation at the follow-up visit.

## 2016-08-05 NOTE — Assessment & Plan Note (Signed)
With regards to her diabetes an ambulatory referral was made for an eye examination for which she is due. Since her ACE inhibitor was stopped she is due for a urine for microalbumin. Unfortunately, she urinated just prior to this appointment and is unable to provide [place] with the specimen at this time. We will obtain this urine specimen for a urine microalbumin at the follow-up visit. She was given a Pneumovax 23 shot today to complete the pneumococcal series. She will be due for a booster in 5 years at the age of 29. We had a long discussion about screening for both cervical and colon cancer. Given her significant cardiopulmonary disease requiring home oxygen therapy she is felt to be a poor candidate for any surgical intervention. In addition, were she to be found to have cervical cancer, she would not agree to radiation therapy as this had a significant impact on her quality of life at the last time she received this for her lung cancer. Therefore, the decision was made to forego any screening for cervical or colon cancer as the results would not change our intervention.

## 2016-08-05 NOTE — Assessment & Plan Note (Signed)
Assessment  With the aggressive diuresis necessary to treat her chronic diastolic heart failure her chronic venous insufficiency has been well controlled recently. Because she has lightheadedness upon movement I am concerned she is over diuresed.  Plan  We are adjusting the Lasix dose based on her symptomatic dizziness with positional changes and likely overdiuresis. This may impact upon the control of her chronic venous insufficiency. This will be reassessed at the follow-up visit to see if the change in her diuretic dose has had any impact on her chronic venous insufficiency.

## 2016-08-05 NOTE — Patient Instructions (Addendum)
It was great to see you again.  I am sorry that you were not breathing well a few weeks ago and needed to come to the hospital.  1) The pharmacist will look to see if there is anywhere that will take your extra medication.  She will call you if she finds something.  2) Try taking 1 tablet of lasix every morning and weighing yourself.  If your weight goes up more than 3 pounds from your weight today then take 2 tablets of lasix (80 mg) until your weight is back to within 3 pounds of today's weight.  This may help with some of your dizziness.  3) I started colace 50 mg twice daily for your constipation.  4) Keep taking the pantoprazole for your acid reflux.  I have added ranitidine 300 mg each night to see if we can get better symptom control.  5) We gave you a pneumovax-23 (pneumonia shot) today.  6) We gave you a vitamin B12 shot today.  7) I wrote for a new nebulizer machine.  8) Increase your gabapentin to 600 mg (2 tablets) at night for your back pain.  9) I started tylenol #3 1-2 tablets every 6 hours as needed for back pain unresponsive to the gabapentin.  10) We checked a potassium and vitamin D level today.  I will call you next week with the results.  11)  I placed a referral for an eye examination other than with [facility].  12) Keep taking your other medications as prescribed.  I will see you in 3 months, sooner if necessary.

## 2016-08-05 NOTE — Progress Notes (Signed)
Subjective:    Patient ID: [name], female    DOB: 1955-12-24, 61 y.o.   MRN: [id number]  HPI  [name] is here for hospital follow-up after a COPD exacerbation and reassessment of her diabetes, chronic diastolic heart failure, GERD, and vitamin D deficiency. Please see the A&P for the status of the pt's chronic medical problems.  Review of Systems  Constitutional: Negative for activity change, appetite change and unexpected weight change.  Respiratory: Positive for shortness of breath. Negative for cough, chest tightness and wheezing.   Cardiovascular: Negative for chest pain, palpitations and leg swelling.  Gastrointestinal: Positive for constipation. Negative for diarrhea, nausea and vomiting.  Musculoskeletal: Positive for back pain. Negative for joint swelling and myalgias.  Skin: Negative for rash and wound.  Neurological: Positive for light-headedness.      Objective:   Physical Exam  Constitutional: She is oriented to person, place, and time. She appears well-developed and well-nourished. No distress.  HENT:  Head: Normocephalic and atraumatic.  Eyes: Conjunctivae are normal. Right eye exhibits no discharge. Left eye exhibits no discharge. No scleral icterus.  Cardiovascular: Normal rate, regular rhythm and normal heart sounds.  Exam reveals no gallop and no friction rub.   No murmur heard. Pulmonary/Chest: Effort normal. No respiratory distress. She has wheezes. She has rales.  Isolated expiratory wheeze at the right base, bilateral mid to basilar inspiratory crackles with decreased breath sounds at the left base.  Abdominal: Soft. Bowel sounds are normal. She exhibits no distension. There is no tenderness. There is no rebound and no guarding.  Musculoskeletal: Normal range of motion. She exhibits no edema, tenderness or deformity.  Mild tenderness to palpation at left flank just below bra line.  No overlying rash or palpable abnormalities.  Neurological: She is  alert and oriented to person, place, and time. She exhibits normal muscle tone.  Skin: Skin is warm and dry. No rash noted. She is not diaphoretic. No erythema.  Psychiatric: She has a normal mood and affect. Her behavior is normal. Judgment and thought content normal.  Nursing note and vitals reviewed.     Assessment & Plan:   Please see problem oriented charting.

## 2016-08-05 NOTE — Assessment & Plan Note (Signed)
Assessment  She continues to deny any angina associated with her coronary artery disease despite not being on any antianginal medication.  Plan  As she is not having any angina we are not pressed to start beta blockers, calcium channel blockers, or nitrates. This is good given her symptomatic orthostasis with the diuresis needed for her chronic diastolic heart failure. We will continue to aggressively treat her hyperlipidemia with the rosuvastatin and reassess for evidence of angina at the follow-up visit.

## 2016-08-05 NOTE — Assessment & Plan Note (Signed)
Assessment  She is scheduled to follow-up with her oncologist at the cancer center on May 31.  Plan  She was encouraged to make that appointment to assess if there is evidence of recurrence of her disease.

## 2016-08-05 NOTE — Assessment & Plan Note (Signed)
Assessment  Symptomatically she has recovered well from her recent COPD exacerbation. She has been compliant with the inhaler she has available, specifically the anti-cholinergic, steroid via nebulizer, and when necessary albuterol. She states that her insurance company has not covered the [place]. She also states that the anticholinergic results and a metallic taste in her mouth. She has been compliant with her oxygen therapy.  Plan  We will continue the home oxygen therapy which she qualifies because of her underlying pulmonary hypertension as well as the and a cholinergic, steroid, and beta agonist via inhalation at the current doses. She plans on contacting her insurance company to inquire about the [place]. If she is unable to obtain this medication we will try an alternative long-acting beta agonist. We will reassess the efficacy of this regimen and her ability to get the inhaled long-acting beta agonist at the follow-up visit.

## 2016-08-05 NOTE — Assessment & Plan Note (Signed)
Assessment  She is tolerating the rosuvastatin 20 mg by mouth daily well without myalgias.  Plan  We will continue the high intensity statin and reassess for intolerances at the follow-up visit.

## 2016-08-05 NOTE — Assessment & Plan Note (Signed)
Assessment  She has a history of vitamin D deficiency and this has not been reassessed recently.  Plan  A vitamin D level was obtained today and is pending at the time of this dictation. Further intervention is pending this lab being resulted and appropriate changes in her regimen will be made if necessary.

## 2016-08-05 NOTE — Assessment & Plan Note (Signed)
Assessment  Clinically her chronic diastolic heart failure is well compensated at this time. In fact, she may be slightly over diuresed. Her blood pressure is rather low today and she notes chronic dizziness that requires her to slowly change position.  Plan  She will decrease her Lasix to 40 mg every morning and check her weight on a daily basis. If her weight increases 3 pounds above today's weight, which we will use as a baseline, she is to take 80 mg by mouth daily until her weight falls within 3 pounds of her baseline. We will reassess the lightheadedness with a slight backing off of her diuretics at the follow-up visit. Of note, she has not required her metolazone for several months, and if she does not require it before the next visit I will remove it from her list of medications upon her return visit.

## 2016-08-05 NOTE — Assessment & Plan Note (Signed)
Assessment  She is compliant with the pantoprazole 40 mg by mouth daily. Despite this, she has continued to have symptomatic heartburn attributed to her gastroesophageal reflux disease. Of note, she had a Nissen fundoplication in the past as well. She remains morbidly obese and this does not help the situation.  Plan  We will continue the pantoprazole at 40 mg by mouth daily and start ranitidine 300 mg by mouth at night in hopes of getting better symptomatically control of her gastroesophageal reflux disease. We will reassess the efficacy of this addition at the follow-up visit.

## 2016-08-05 NOTE — Assessment & Plan Note (Signed)
Assessment  Her diabetes is very well controlled on the metformin 500 mg by mouth twice daily with a hemoglobin A1c within the last month of 5.8. With regards to her associated aortic atherosclerotic disease she has not had any claudication symptoms or evidence of vascular and there is cement in the lower extremities.  Plan  We will continue the metformin 500 mg by mouth twice daily and repeat the hemoglobin A1c in 3 months. If it remains below 6.0 we may titrate down the metformin to 500 mg daily and then eventually off if tolerated. For her associated aortic atherosclerotic disease we will continue to aggressively manage her diabetes as well as her hyperlipidemia.

## 2016-08-05 NOTE — Assessment & Plan Note (Signed)
Assessment  She continues to take the oral vitamin B12 supplementation. Despite this, she has some left flank pain that radiates anteriorly in a dermatomal pattern. Given the description of this pain, it sounds neuropathic in nature and unrelated to a prior fall. This may be secondary to continue vitamin B12 deficiency. She has changed her gabapentin dosing to 600 mg at night on occasion with some improvement in the pain.   Plan  She was given a vitamin B12 IM shot today 1000 g 1. In addition, her gabapentin was changed to 600 mg by mouth at night for this neuropathic pain and will be chronically dosed at this level. When she has tried to titrate up the gabapentin further she has developed severe fatigue. Finally, we will start acetaminophen-codeine 300 mg-30 mg 1-2 tablets every 6 hours as needed for pain in the left flank area. She was given a prescription for 60 per month with 5 refills. We will reassess the efficacy of these changes in controlling her left flank pain at the follow-up visit. If it persists, we may repeat the chest x-ray with detailed rib films to make sure there is not a fracture whether traumatic or pathologic.

## 2016-08-05 NOTE — Assessment & Plan Note (Signed)
Assessment  Clinically, her pulmonary hypertension is stable and she does not have evidence of decompensated right heart failure at this time on examination. She remains compliant with her oxygen therapy which is an important adjunct to treating her pulmonary hypertension. In addition, her COPD is clinically stable on her inhaler regimen.  Plan  We will continue aggressive management of her chronic obstructive pulmonary disease with her current inhaler regimen and also continue the oxygen therapy in order to avoid chronic hypoxemia and worsening pulmonary hypertension. We will reassess her clinically at the follow-up visit.

## 2016-08-06 LAB — BMP8+ANION GAP
ANION GAP: 21 mmol/L — AB (ref 10.0–18.0)
BUN/Creatinine Ratio: 6 — ABNORMAL LOW (ref 12–28)
BUN: 17 mg/dL (ref 8–27)
CALCIUM: 9.3 mg/dL (ref 8.7–10.3)
CHLORIDE: 92 mmol/L — AB (ref 96–106)
CO2: 29 mmol/L (ref 18–29)
Creatinine, Ser: 2.96 mg/dL — ABNORMAL HIGH (ref 0.57–1.00)
GFR calc Af Amer: 19 mL/min/{1.73_m2} — ABNORMAL LOW (ref >59)
GFR calc non Af Amer: 16 mL/min/{1.73_m2} — ABNORMAL LOW (ref >59)
GLUCOSE: 130 mg/dL — AB (ref 65–99)
POTASSIUM: 3.6 mmol/L (ref 3.5–5.2)
Sodium: 142 mmol/L (ref 134–144)

## 2016-08-06 LAB — VITAMIN D 25 HYDROXY (VIT D DEFICIENCY, FRACTURES): Vit D, 25-Hydroxy: 24.3 ng/mL — ABNORMAL LOW (ref 30.0–100.0)

## 2016-08-08 MED ORDER — VITAMIN D (ERGOCALCIFEROL) 1.25 MG (50000 UNIT) PO CAPS: 50000.0000 [IU] | ORAL_CAPSULE | ORAL | 0 refills | Status: DC

## 2016-08-08 NOTE — Addendum Note (Signed)
Addended by: Oval Linsey D on: 08/08/2016 03:21 PM   Modules accepted: Orders

## 2016-08-08 NOTE — Progress Notes (Signed)
Patient ID: [name], female   DOB: 1955/08/30, 61 y.o.   MRN: [id number]  BMP: Na 142, K 3.6, Cl 92, HCO3 29, BUN 17, Cr 2.96, eGFR 16, AG 21  Renal function remains poor (but is stable) and there is an elevated anion gap.  I am hopeful we will have some improvement in the renal function with a slight decrease in her diuretic dosing and closely following weights.  Because of the very poor renal function, fairly decent diabetic control on low dose metformin, and with the elevated anion gap we will discontinue the metformin and repeat a hemoglobin A1C at follow-up.  Vitamin D 24.3  We will start ergocalciferol 50,000 units weekly for 2 months.  We will repeat the vitamin D level at the return visit to assure she has been adequately repleted.  I called [name] to discuss the above and we came up with a plan to address the issues outlined above together.

## 2016-08-09 ENCOUNTER — Telehealth: Payer: Self-pay | Admitting: *Deleted

## 2016-08-09 DIAGNOSIS — I5032 Chronic diastolic (congestive) heart failure: Secondary | ICD-10-CM | POA: Diagnosis not present

## 2016-08-09 DIAGNOSIS — R0902 Hypoxemia: Secondary | ICD-10-CM | POA: Diagnosis not present

## 2016-08-09 DIAGNOSIS — I509 Heart failure, unspecified: Secondary | ICD-10-CM | POA: Diagnosis not present

## 2016-08-09 DIAGNOSIS — R0609 Other forms of dyspnea: Secondary | ICD-10-CM | POA: Diagnosis not present

## 2016-08-09 DIAGNOSIS — R0602 Shortness of breath: Secondary | ICD-10-CM | POA: Diagnosis not present

## 2016-08-09 DIAGNOSIS — J9620 Acute and chronic respiratory failure, unspecified whether with hypoxia or hypercapnia: Secondary | ICD-10-CM | POA: Diagnosis not present

## 2016-08-09 DIAGNOSIS — J449 Chronic obstructive pulmonary disease, unspecified: Secondary | ICD-10-CM | POA: Diagnosis not present

## 2016-08-09 NOTE — Telephone Encounter (Addendum)
Forms completed by Dr. Eppie Gibson and were faxed to [facility] for PA review on 08/08/2016 .  Sander Nephew , RN 08/09/2016 8:48 AM Fax from [facility] patient was denied coverage under [employer] Part [place] but was approved for coverage of the [place] under Medicare Part B. OMB Approval No. [id number] expires 06/02/2018.  Sander Nephew, RN 08/15/2016 11:15 AM.

## 2016-08-23 ENCOUNTER — Encounter (HOSPITAL_COMMUNITY): Payer: Self-pay | Admitting: Emergency Medicine

## 2016-08-23 ENCOUNTER — Observation Stay (HOSPITAL_COMMUNITY): Payer: Medicare HMO

## 2016-08-23 ENCOUNTER — Emergency Department (HOSPITAL_COMMUNITY): Payer: Medicare HMO

## 2016-08-23 ENCOUNTER — Observation Stay (HOSPITAL_COMMUNITY)
Admission: EM | Admit: 2016-08-23 | Discharge: 2016-08-24 | Disposition: A | Discharge status: Home / Self Care | Payer: Medicare HMO | Attending: Internal Medicine | Admitting: Internal Medicine

## 2016-08-23 ENCOUNTER — Other Ambulatory Visit: Payer: Self-pay | Admitting: Pharmacist

## 2016-08-23 DIAGNOSIS — Z7951 Long term (current) use of inhaled steroids: Secondary | ICD-10-CM | POA: Insufficient documentation

## 2016-08-23 DIAGNOSIS — I2510 Atherosclerotic heart disease of native coronary artery without angina pectoris: Secondary | ICD-10-CM | POA: Insufficient documentation

## 2016-08-23 DIAGNOSIS — J90 Pleural effusion, not elsewhere classified: Secondary | ICD-10-CM | POA: Diagnosis not present

## 2016-08-23 DIAGNOSIS — K5901 Slow transit constipation: Secondary | ICD-10-CM | POA: Diagnosis present

## 2016-08-23 DIAGNOSIS — K219 Gastro-esophageal reflux disease without esophagitis: Secondary | ICD-10-CM | POA: Insufficient documentation

## 2016-08-23 DIAGNOSIS — Z66 Do not resuscitate: Secondary | ICD-10-CM | POA: Diagnosis not present

## 2016-08-23 DIAGNOSIS — F329 Major depressive disorder, single episode, unspecified: Secondary | ICD-10-CM | POA: Diagnosis not present

## 2016-08-23 DIAGNOSIS — E119 Type 2 diabetes mellitus without complications: Secondary | ICD-10-CM | POA: Diagnosis not present

## 2016-08-23 DIAGNOSIS — Z9981 Dependence on supplemental oxygen: Secondary | ICD-10-CM | POA: Insufficient documentation

## 2016-08-23 DIAGNOSIS — Z87891 Personal history of nicotine dependence: Secondary | ICD-10-CM | POA: Insufficient documentation

## 2016-08-23 DIAGNOSIS — Z85118 Personal history of other malignant neoplasm of bronchus and lung: Secondary | ICD-10-CM

## 2016-08-23 DIAGNOSIS — Z79899 Other long term (current) drug therapy: Secondary | ICD-10-CM

## 2016-08-23 DIAGNOSIS — R0602 Shortness of breath: Secondary | ICD-10-CM

## 2016-08-23 DIAGNOSIS — I5032 Chronic diastolic (congestive) heart failure: Secondary | ICD-10-CM | POA: Insufficient documentation

## 2016-08-23 DIAGNOSIS — E538 Deficiency of other specified B group vitamins: Secondary | ICD-10-CM | POA: Insufficient documentation

## 2016-08-23 DIAGNOSIS — E559 Vitamin D deficiency, unspecified: Secondary | ICD-10-CM | POA: Insufficient documentation

## 2016-08-23 DIAGNOSIS — X58XXXA Exposure to other specified factors, initial encounter: Secondary | ICD-10-CM | POA: Insufficient documentation

## 2016-08-23 DIAGNOSIS — E785 Hyperlipidemia, unspecified: Secondary | ICD-10-CM | POA: Diagnosis not present

## 2016-08-23 DIAGNOSIS — K5900 Constipation, unspecified: Secondary | ICD-10-CM | POA: Diagnosis not present

## 2016-08-23 DIAGNOSIS — J9621 Acute and chronic respiratory failure with hypoxia: Secondary | ICD-10-CM | POA: Diagnosis not present

## 2016-08-23 DIAGNOSIS — R109 Unspecified abdominal pain: Secondary | ICD-10-CM | POA: Diagnosis not present

## 2016-08-23 DIAGNOSIS — J441 Chronic obstructive pulmonary disease with (acute) exacerbation: Secondary | ICD-10-CM | POA: Diagnosis not present

## 2016-08-23 DIAGNOSIS — Z8614 Personal history of Methicillin resistant Staphylococcus aureus infection: Secondary | ICD-10-CM | POA: Diagnosis not present

## 2016-08-23 DIAGNOSIS — I272 Pulmonary hypertension, unspecified: Secondary | ICD-10-CM | POA: Diagnosis not present

## 2016-08-23 DIAGNOSIS — I255 Ischemic cardiomyopathy: Secondary | ICD-10-CM | POA: Insufficient documentation

## 2016-08-23 DIAGNOSIS — Z923 Personal history of irradiation: Secondary | ICD-10-CM | POA: Insufficient documentation

## 2016-08-23 DIAGNOSIS — Z7982 Long term (current) use of aspirin: Secondary | ICD-10-CM | POA: Diagnosis not present

## 2016-08-23 DIAGNOSIS — S2232XA Fracture of one rib, left side, initial encounter for closed fracture: Secondary | ICD-10-CM | POA: Diagnosis not present

## 2016-08-23 LAB — BASIC METABOLIC PANEL
Anion gap: 8 (ref 5–15)
BUN: 14 mg/dL (ref 6–20)
CALCIUM: 8.9 mg/dL (ref 8.9–10.3)
CO2: 32 mmol/L (ref 22–32)
CREATININE: 2.47 mg/dL — AB (ref 0.44–1.00)
Chloride: 100 mmol/L — ABNORMAL LOW (ref 101–111)
GFR calc non Af Amer: 20 mL/min — ABNORMAL LOW (ref >60)
GFR, EST AFRICAN AMERICAN: 23 mL/min — AB (ref >60)
GLUCOSE: 190 mg/dL — AB (ref 65–99)
Potassium: 3.6 mmol/L (ref 3.5–5.1)
SODIUM: 140 mmol/L (ref 135–145)

## 2016-08-23 LAB — CBC
HCT: 30.7 % — ABNORMAL LOW (ref 36.0–46.0)
HEMOGLOBIN: 9.2 g/dL — AB (ref 12.0–15.0)
MCH: 28.8 pg (ref 26.0–34.0)
MCHC: 30 g/dL (ref 30.0–36.0)
MCV: 96.2 fL (ref 78.0–100.0)
Platelets: 206 10*3/uL (ref 150–400)
RBC: 3.19 MIL/uL — AB (ref 3.87–5.11)
RDW: 13.6 % (ref 11.5–15.5)
WBC: 10.1 10*3/uL (ref 4.0–10.5)

## 2016-08-23 LAB — GLUCOSE, CAPILLARY
Glucose-Capillary: 349 mg/dL — ABNORMAL HIGH (ref 65–99)
Glucose-Capillary: 283 mg/dL — ABNORMAL HIGH (ref 65–99)
Glucose-Capillary: 270 mg/dL — ABNORMAL HIGH (ref 65–99)
Glucose-Capillary: 229 mg/dL — ABNORMAL HIGH (ref 65–99)

## 2016-08-23 LAB — I-STAT TROPONIN, ED: Troponin i, poc: 0 ng/mL (ref 0.00–0.08)

## 2016-08-23 LAB — BRAIN NATRIURETIC PEPTIDE: B Natriuretic Peptide: 86.8 pg/mL (ref 0.0–100.0)

## 2016-08-23 MED ORDER — DOCUSATE SODIUM 50 MG PO CAPS
50.0000 mg | ORAL_CAPSULE | Freq: Two times a day (BID) | ORAL | Status: DC
  Filled 2016-08-23 (×4): qty 1

## 2016-08-23 MED ORDER — FUROSEMIDE 40 MG PO TABS
40.0000 mg | ORAL_TABLET | Freq: Every day | ORAL | Status: DC
  Administered 2016-08-23 – 2016-08-24 (×2): 40 mg via ORAL
  Filled 2016-08-23 (×2): qty 1

## 2016-08-23 MED ORDER — SENNA 8.6 MG PO TABS
1.0000 | ORAL_TABLET | Freq: Every day | ORAL | Status: DC
  Filled 2016-08-23: qty 1

## 2016-08-23 MED ORDER — INSULIN ASPART 100 UNIT/ML ~~LOC~~ SOLN
0.0000 [IU] | Freq: Three times a day (TID) | SUBCUTANEOUS | Status: DC
  Administered 2016-08-24: 2 [IU] via SUBCUTANEOUS
  Administered 2016-08-24: 3 [IU] via SUBCUTANEOUS

## 2016-08-23 MED ORDER — ACETAMINOPHEN 325 MG PO TABS
650.0000 mg | ORAL_TABLET | Freq: Four times a day (QID) | ORAL | Status: DC | PRN
  Administered 2016-08-23: 650 mg via ORAL
  Filled 2016-08-23: qty 2

## 2016-08-23 MED ORDER — ALBUTEROL SULFATE (2.5 MG/3ML) 0.083% IN NEBU: 2.5000 mg | INHALATION_SOLUTION | RESPIRATORY_TRACT | Status: DC | PRN

## 2016-08-23 MED ORDER — INSULIN ASPART 100 UNIT/ML ~~LOC~~ SOLN
0.0000 [IU] | Freq: Every day | SUBCUTANEOUS | Status: DC
  Administered 2016-08-23: 2 [IU] via SUBCUTANEOUS

## 2016-08-23 MED ORDER — IPRATROPIUM-ALBUTEROL 0.5-2.5 (3) MG/3ML IN SOLN
3.0000 mL | RESPIRATORY_TRACT | Status: DC
  Administered 2016-08-23 (×2): 3 mL via RESPIRATORY_TRACT
  Filled 2016-08-23 (×2): qty 3

## 2016-08-23 MED ORDER — ALBUTEROL SULFATE (2.5 MG/3ML) 0.083% IN NEBU
5.0000 mg | INHALATION_SOLUTION | Freq: Once | RESPIRATORY_TRACT | Status: AC
  Administered 2016-08-23: 5 mg via RESPIRATORY_TRACT
  Filled 2016-08-23: qty 6

## 2016-08-23 MED ORDER — ALBUTEROL SULFATE (2.5 MG/3ML) 0.083% IN NEBU
2.5000 mg | INHALATION_SOLUTION | Freq: Four times a day (QID) | RESPIRATORY_TRACT | Status: DC | PRN
Start: 2016-08-23 — End: 2016-08-23

## 2016-08-23 MED ORDER — INSULIN ASPART 100 UNIT/ML ~~LOC~~ SOLN
0.0000 [IU] | Freq: Three times a day (TID) | SUBCUTANEOUS | Status: DC
Start: 2016-08-23 — End: 2016-08-23
  Administered 2016-08-23 (×2): 5 [IU] via SUBCUTANEOUS
  Administered 2016-08-23: 7 [IU] via SUBCUTANEOUS

## 2016-08-23 MED ORDER — IPRATROPIUM-ALBUTEROL 0.5-2.5 (3) MG/3ML IN SOLN
3.0000 mL | Freq: Four times a day (QID) | RESPIRATORY_TRACT | Status: DC
  Administered 2016-08-23: 3 mL via RESPIRATORY_TRACT
  Filled 2016-08-23 (×2): qty 3

## 2016-08-23 MED ORDER — BUPROPION HCL ER (SR) 150 MG PO TB12
150.0000 mg | ORAL_TABLET | Freq: Two times a day (BID) | ORAL | Status: DC
  Administered 2016-08-23 – 2016-08-24 (×3): 150 mg via ORAL
  Filled 2016-08-23 (×4): qty 1

## 2016-08-23 MED ORDER — ACETAMINOPHEN-CODEINE #3 300-30 MG PO TABS: 1.0000 | ORAL_TABLET | Freq: Four times a day (QID) | ORAL | Status: DC | PRN

## 2016-08-23 MED ORDER — FUROSEMIDE 20 MG PO TABS: 80.0000 mg | ORAL_TABLET | Freq: Every day | ORAL | Status: DC

## 2016-08-23 MED ORDER — BISACODYL 10 MG RE SUPP: 10.0000 mg | Freq: Once | RECTAL | Status: DC

## 2016-08-23 MED ORDER — BUDESONIDE 0.5 MG/2ML IN SUSP
0.5000 mg | Freq: Two times a day (BID) | RESPIRATORY_TRACT | Status: DC
  Administered 2016-08-23 – 2016-08-24 (×2): 0.5 mg via RESPIRATORY_TRACT
  Filled 2016-08-23 (×4): qty 2

## 2016-08-23 MED ORDER — ROSUVASTATIN CALCIUM 20 MG PO TABS
20.0000 mg | ORAL_TABLET | Freq: Every day | ORAL | Status: DC
  Administered 2016-08-23: 20 mg via ORAL
  Filled 2016-08-23: qty 1

## 2016-08-23 MED ORDER — SENNA 8.6 MG PO TABS
2.0000 | ORAL_TABLET | Freq: Two times a day (BID) | ORAL | Status: DC
  Administered 2016-08-23: 17.2 mg via ORAL
  Filled 2016-08-23: qty 2

## 2016-08-23 MED ORDER — PREDNISONE 20 MG PO TABS
40.0000 mg | ORAL_TABLET | Freq: Every day | ORAL | Status: DC
  Administered 2016-08-23 – 2016-08-24 (×2): 40 mg via ORAL
  Filled 2016-08-23 (×2): qty 2

## 2016-08-23 MED ORDER — ASPIRIN 81 MG PO CHEW
81.0000 mg | CHEWABLE_TABLET | Freq: Every day | ORAL | Status: DC
  Administered 2016-08-23: 81 mg via ORAL
  Filled 2016-08-23: qty 1

## 2016-08-23 MED ORDER — POLYETHYLENE GLYCOL 3350 17 G PO PACK: 17.0000 g | PACK | Freq: Two times a day (BID) | ORAL | Status: DC

## 2016-08-23 MED ORDER — PANTOPRAZOLE SODIUM 40 MG PO TBEC
40.0000 mg | DELAYED_RELEASE_TABLET | Freq: Every day | ORAL | Status: DC
  Administered 2016-08-23 – 2016-08-24 (×2): 40 mg via ORAL
  Filled 2016-08-23 (×2): qty 1

## 2016-08-23 MED ORDER — FLUTICASONE-UMECLIDIN-VILANT 100-62.5-25 MCG/INH IN AEPB: 1.0000 | INHALATION_SPRAY | Freq: Every day | RESPIRATORY_TRACT | 0 refills | Status: DC

## 2016-08-23 MED ORDER — SENNA 8.6 MG PO TABS: 1.0000 | ORAL_TABLET | Freq: Every day | ORAL | Status: DC

## 2016-08-23 MED ORDER — IPRATROPIUM-ALBUTEROL 0.5-2.5 (3) MG/3ML IN SOLN
3.0000 mL | Freq: Once | RESPIRATORY_TRACT | Status: AC
  Administered 2016-08-23: 3 mL via RESPIRATORY_TRACT
  Filled 2016-08-23: qty 3

## 2016-08-23 MED ORDER — ACETAMINOPHEN-CODEINE #3 300-30 MG PO TABS
1.0000 | ORAL_TABLET | Freq: Four times a day (QID) | ORAL | Status: DC | PRN
  Filled 2016-08-23: qty 2

## 2016-08-23 MED ORDER — METHYLPREDNISOLONE SODIUM SUCC 125 MG IJ SOLR
80.0000 mg | Freq: Once | INTRAMUSCULAR | Status: AC
Start: 2016-08-23 — End: 2016-08-23
  Administered 2016-08-23: 80 mg via INTRAVENOUS
  Filled 2016-08-23: qty 2

## 2016-08-23 MED ORDER — HEPARIN SODIUM (PORCINE) 5000 UNIT/ML IJ SOLN
5000.0000 [IU] | Freq: Three times a day (TID) | INTRAMUSCULAR | Status: DC
  Administered 2016-08-23 – 2016-08-24 (×4): 5000 [IU] via SUBCUTANEOUS
  Filled 2016-08-23 (×4): qty 1

## 2016-08-23 NOTE — ED Notes (Signed)
Patient transported to CT 

## 2016-08-23 NOTE — Progress Notes (Signed)
   Subjective: Patient feels that her breathing is improved since admission however still with SOB. Nervous about paracentesis.    Objective:  Vital signs in last 24 hours: Vitals:   08/23/16 0700 08/23/16 0715 08/23/16 0809 08/23/16 1034  BP: 117/70 112/63 128/76   Pulse: 91 88 99 98  Resp: (!) 22 (!) 22 (!) 22 20  Temp:   98.8 F (37.1 C)   TempSrc:   Oral   SpO2: 97% 96% 93% 92%  Weight:   262 lb 4.8 oz (119 kg)   Height:   _0  (1.651 m)    Physical Exam Constitutional: Obese, NAD, sitting upright on edge of bed.  HEENT: Atraumatic, normocephalic. No icterus, injection or exudate.  Cardiovascular: RRR, no murmurs, rubs, or gallops.  Pulmonary/Chest: Diminished breath sounds throughout however absent left lower lung. Dullness to percussion left lower lung. Abdominal: Soft, minimally tender, non distended. +BS.  Extremities: Warm and well perfused. No edema.  Psychiatric: Normal mood and affect congruent  Assessment/Plan: Patient is a 61 yo F with pmhx significant for oxygen dependent COPD (4L) and non-small cell lung cancer, adenocarcinoma s/p curative radiotherapy in August of 2016 admitted for 1 days of worsening shortness of breath. Found to have re-accumulation of her left pleural effusion.   Shortness of Breath, COPD, Left pleural effusion, Hx of Left Lung CA: Multifactorial in this patient with GOLD stage-IV COPD and re-accumulation of her left pleural effusion. Fluid analysis from prior thoracenteses with exudative process however cytology without malignant cells. Additionally, she was noted on having a 9.63m pleural-based nodularity in apex of left lung concerning for tumor spread. She follows with Dr. MInda Merlinof oncology. Suspect patients symptoms are more likely due to pleural effusion in this patient with low reserve.  -Therapeutic and diagnostic thoracentesis today at bedside -This will appear to be the patients fifth thoracentesis, 12/2014, 02/2016, 04/2016, 07/2016.    -Continue prednisone 40 mg Q daily, consider d/c -Scheduled duonebs Q6H -Albuterol PRN -No ABX  Constipation: She endorses an 18 day history of constipation. Started colace 50 mg BID after her PCP visit 5/4 however has yet to have BM. Have given Miralax and Senakot without BM today. She is without nausea or vomiting presently.  -Will order Dulcolax suppository however low threshold for enema  HFpEF: Secondary to ischemic cardiomyopathy. Looks euvolemic on examination and she is without bibasilar crackles.  -Continue Aspirin 81 mg daily  -PO Lasix 40 mg daily  DM Type II: On Metformin at home. -SSI  Dispo: Anticipated discharge in approximately 1-2 day(s).   Earlena Werst, DO 08/23/2016, 11:21 AM Pager: [phone]

## 2016-08-23 NOTE — ED Notes (Signed)
Pt refused pain meds at this time.

## 2016-08-23 NOTE — Procedures (Signed)
Thoracentesis Procedure Note  Pre-operative Diagnosis:  Left sided pleural effusion with dyspnea and chest pain  Indications:  Dyspnea, chest pain, evaluation of previous borderline exudative effusion  Procedure Details:  Informed consent was obtained after explanation of the risks and benefits of the procedure, refer to the consent documentation.  Time-out Performed immediately prior to the procedure.  All available chest radiographs were reviewed and the patient was subsequently placed in a sitting/semi-recumbent position.  Using ultrasound guidance a moderate) pleural effusion was noted on the left.  The area was prepped with chlorhexadine and draped in sterile fashion.  Following this 1% lidocaine was injected subcutaneously and deep to provide anesthesia. The pleural space was unable to be accessed with syringe needle for anaesthesia and placement confirmation due to deep target from her thick soft tissue and chest wall. Catheter placement was discontinued to be more safely placed with appropriate equipment by IR consultation.  Findings:   Fluid not collected  Condition: The patient tolerated the procedure well and remains in the same condition as pre-procedure.  Complications: None; patient tolerated the procedure well.  Plan: No CXR ordered due to no entry of pleural space Consultation called to IR for guided thoracentesis for symptom relief and fluid analysis, most likely tomorrow   Dr. Evette Doffing was present in a supervising role throughout the procedure.

## 2016-08-23 NOTE — ED Notes (Signed)
Admitting Provider at bedside.

## 2016-08-23 NOTE — ED Triage Notes (Signed)
Per EMS: pt was waling to the bathroom and starting to get SOB. Pt has Hx of CHF and COPD. Pt states she has taken her water pill today.  Pt gave herself 3 neb treatments at home and EMS gave one duoneb. EMS heard diminished sounds in left lung. Pt A&Ox4. NSR. VSS. Pt is on 4L [place] at home.

## 2016-08-23 NOTE — ED Provider Notes (Signed)
[facility] DEPT Provider Note   CSN: [id number] Arrival date & time: 08/23/16  0105  By signing my name below, I, Reola Mosher, attest that this documentation has been prepared under the direction and in the presence of Horton, Barbette Hair, MD. Electronically Signed: Reola Mosher, ED Scribe. 08/23/16. 1:40 AM.  History   Chief Complaint Chief Complaint  Patient presents with  . Shortness of Breath   The history is provided by the patient. No language interpreter was used.    HPI Comments: [name] is a 61 y.o. female who presents to the Emergency Department complaining of acute on chronic, worsening shortness of breath beginning earlier yesterday. She notes associated chills. Her shortness of breath is worse with mild exertion and movement. No orthopnea worse from her baseline. Pt used 2 DuoNeb treatments yesterday and she was given one en route without relief of her symptoms. Pt in on 4L O2 continually d/t her COPD at home. No recent increase in her home O2. She denies chest pain, fever, cough, leg swelling, or any other associated symptoms.   Past Medical History:  Diagnosis Date  . Arthritis    "some scattered" (03/03/2015)  . CHF (congestive heart failure) ([place])   . Chronic bronchitis ([place])    "get it most years" (03/03/2015)  . COPD (chronic obstructive pulmonary disease) (HCC)    Severe. Gold Stage IV.  PFTs (12/2008) - severe obstructive airway disease. Active tobacco use. Requires 4L O2 at home.  . Coronary artery disease    S/P PCI of LAD with DES (12/2008). Total occlusion of RCA noted at that time., medically managed. ACS ruled out 03/2009 with Lexiscan myoview . Followed by [place].  . Depression   . Diastolic dysfunction    2-D Echo (12/2008) - Normal LV Systolic funciton with EF 60-65%. Grade 1 diastolid dysfunction. No regional wall motion abnormalities. Moderate pulmonary HTN with PA peak pressure 56mHg.  . Full dentures   . GERD  (gastroesophageal reflux disease)    S/P Nissen fundoplication.  .Marland KitchenHistory of hiatal hernia   . Hx MRSA infection    Recurrent MRSA thigh abscesses.  . Hyperlipidemia   . Lung cancer ([place]    "left"  . Obesity   . On home oxygen therapy since 2010   4L all the time  . Pneumonia   . Prediabetes    HgbA1c 6.4 (12/2008)  . Pulmonary hypertension ([place]    2-D Echo (002/[id number] - Systolic pressure was moderately increased. PA peak pressure  539mg. 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 Followed by Dr. WrJoya GaskinsLVelora Heckler . Pulmonary nodule, right    Small right middle lobe nodule. Stable as of 12/2008.  . Marland Kitchenhortness of breath dyspnea    with a lot of exertion; if fluid builds up   Patient Active Problem List   Diagnosis Date Noted  . COPD exacerbation ([facility]/22/2018  . Pleural effusion   . Constipation due to slow transit   . History of adenocarcinoma of lung   . Vitamin D deficiency 02/15/2016  . Benign paroxysmal positional vertigo 09/10/2015  . Subjective tinnitus 09/10/2015  . Chronic venous insufficiency   . Vitamin B12 deficiency neuropathy ([facility]09/18/2016  . Primary adenocarcinoma of left lung ([facility]09/04/2014  . Healthcare maintenance 02/06/2013  . Chronic diastolic heart failure ([facility]08/07/2012  . Hyperlipidemia   . Financial difficulties 06/15/2010  . Pulmonary  hypertension ([place]) 01/05/2009  . Morbid obesity with BMI of 40.0-44.9, adult ([place]) 12/31/2008  . Sleep apnea 12/31/2008  . Tobacco abuse 12/22/2008  . 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 Severe chronic obstructive pulmonary disease ([place]) 12/22/2008  . Gastroesophageal reflux disease 12/22/2008  . 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 ([place]) 12/22/2008   Past Surgical History:  Procedure Laterality Date  . ABDOMINAL HYSTERECTOMY  1980's    "still have my cervix"  . APPENDECTOMY    . BACK SURGERY    . BILATERAL OOPHORECTOMY  1979  . BREAST LUMPECTOMY Right 1990's    (biopsy negative)  . CORONARY ANGIOPLASTY WITH STENT PLACEMENT  2010   [place]; RCA  . HERNIA REPAIR    . IR THORACENTESIS ASP PLEURAL SPACE W/IMG GUIDE  07/18/2016  . KNEE ARTHROSCOPY Right ~ 2000  . [place] SURGERY  2001   "herniated discs; both by [facility], Dr. Lorin Mercy"  . NISSEN FUNDOPLICATION    . RIGHT HEART CATHETERIZATION N/A 11/09/2012   Procedure: RIGHT HEART CATH;  Surgeon: Larey Dresser, MD;  Location[facility] CATH LAB;  Service: Cardiovascular;  Laterality: N/A;  . TONSILLECTOMY    . TUBAL LIGATION  1979  . VIDEO BRONCHOSCOPY WITH ENDOBRONCHIAL ULTRASOUND N/A 12/15/2014   Procedure: VIDEO BRONCHOSCOPY WITH ENDOBRONCHIAL ULTRASOUND;  Surgeon: Ivin Poot, MD;  Location: [facility] OR;  Service: Thoracic;  Laterality: N/A;   OB History    No data available     Home Medications    Prior to Admission medications   Medication Sig Start Date End Date Taking? Authorizing Provider  acetaminophen-codeine (TYLENOL #3) 300-30 MG tablet Take 1-2 tablets by mouth every 6 (six) hours as needed for severe pain. 08/05/16  Yes Oval Linsey, MD  albuterol (VENTOLIN HFA) 108 (90 Base) MCG/ACT inhaler Inhale 1-2 puffs into the lungs every 6 (six) hours as needed for shortness of breath. 02/03/16  Yes Oval Linsey, MD  aspirin 81 MG chewable tablet Chew 1 tablet (81 mg total) by mouth at bedtime. 01/16/15  Yes Oval Linsey, MD  budesonide (PULMICORT) 0.5 MG/2ML nebulizer solution Take 2 mLs (0.5 mg total) by nebulization 2 (two) times daily. 06/10/16  Yes Oval Linsey, MD  buPROPion [facility] SR) 150 MG 12 hr tablet Take 1 tablet (150 mg total) by mouth 2 (two) times daily. 06/02/16  Yes Oval Linsey, MD  docusate sodium (COLACE) 50 MG capsule Take 1 capsule (50 mg total) by mouth 2 (two) times daily. 08/05/16  Yes Oval Linsey, MD  furosemide (LASIX) 40 MG  tablet Take 80 mg by mouth daily.   Yes [provider]  gabapentin (NEURONTIN) 300 MG capsule Take 2 capsules (600 mg total) by mouth at bedtime. 08/05/16  Yes Oval Linsey, MD  metolazone (ZAROXOLYN) 2.5 MG tablet TAKE 1 TABLET BY MOUTH AS NEEDED(FOR 5 LBS WEIGHT GAIN) 05/13/15  Yes [name] Friar, PA-C  pantoprazole (PROTONIX) 40 MG tablet Take 1 tablet (40 mg total) by mouth daily. 11/05/15  Yes Oval Linsey, MD  ranitidine (ZANTAC) 300 MG tablet Take 1 tablet (300 mg total) by mouth at bedtime. 08/05/16  Yes Oval Linsey, MD  rosuvastatin (CRESTOR) 20 MG tablet Take 1 tablet (20 mg total) by mouth daily. 09/10/15  Yes Oval Linsey, MD  umeclidinium bromide (INCRUSE ELLIPTA) 62.5 MCG/INH AEPB Inhale 1 puff into the lungs daily. 06/10/16  Yes Oval Linsey, MD  vitamin B-12 (CYANOCOBALAMIN) 1000 MCG tablet Take 1 tablet (1,000 mcg  total) by mouth daily. 09/10/15  Yes Oval Linsey, MD  Vitamin D, Ergocalciferol, (DRISDOL) 50000 units CAPS capsule Take 1 capsule (50,000 Units total) by mouth every 7 (seven) days. 08/08/16  Yes Oval Linsey, MD  arformoterol (BROVANA) 15 MCG/2ML NEBU Take 2 mLs (15 mcg total) by nebulization 2 (two) times daily. Patient not taking: Reported on 08/05/2016 06/10/16   Oval Linsey, MD  potassium chloride SA (K-DUR,KLOR-CON) 20 MEQ tablet Take 1 tablet (20 mEq total) by mouth daily. Patient not taking: Reported on 07/17/2016 11/30/15   Oval Linsey, MD   Family History Family History  Problem Relation Age of Onset  . Heart disease Mother 74       Deceased from MI at 88yo  . Hypertension Mother   . Heart disease Father 59       Deceased of MI age 21yo  . Hypertension Father   . Hypertension Brother   . Lung cancer Unknown        Grandmother   Social History Social History  Substance Use Topics  . Smoking status: Former Smoker    Packs/day: 1.00    Years: 45.00    Types: Cigarettes    Start date: 04/04/1969    Quit date: 11/04/2015  .  Smokeless tobacco: Never Used     Comment: 1 cig per day  . Alcohol use No   Allergies   Fluconazole and Atorvastatin  Review of Systems Review of Systems  Constitutional: Positive for chills. Negative for fever.  Respiratory: Positive for shortness of breath. Negative for cough.   Cardiovascular: Negative for chest pain and leg swelling.  All other systems reviewed and are negative.  Physical Exam Updated Vital Signs BP 112/63   Pulse 88   Temp 98.5 F (36.9 C) (Oral)   Resp (!) 22   Ht _0  (1.651 m)   Wt 117.9 kg (260 lb)   SpO2 96%   BMI 43.27 kg/m   Physical Exam  Constitutional: She is oriented to person, place, and time. She appears well-developed and well-nourished.  Chronically ill-appearing, morbidly obese  HENT:  Head: Normocephalic and atraumatic.  Neck: Neck supple.  Cardiovascular: Normal rate, regular rhythm and normal heart sounds.   No murmur heard. Pulmonary/Chest: Effort normal. No respiratory distress. She has wheezes.  Diminished breath sounds left lower lobe, fine wheezes, normal respiratory rate, fair movement, 4 L nasal cannula in place  Abdominal: Soft. Bowel sounds are normal. There is no tenderness.  Musculoskeletal: She exhibits edema.  Trace lower extremity edema  Neurological: She is alert and oriented to person, place, and time.  Skin: Skin is warm and dry.  Psychiatric: She has a normal mood and affect.  Nursing note and vitals reviewed.  ED Treatments / Results  DIAGNOSTIC STUDIES: Oxygen Saturation is 96% on 4L via [place], normal by my interpretation.   COORDINATION OF CARE: 1:40 AM-Discussed next steps with pt. Pt verbalized understanding and is agreeable with the plan.   Labs (all labs ordered are listed, but only abnormal results are displayed) Labs Reviewed  CBC - Abnormal; Notable for the following:       Result Value   RBC 3.19 (*)    Hemoglobin 9.2 (*)    HCT 30.7 (*)    All other components within normal limits    BASIC METABOLIC PANEL - Abnormal; Notable for the following:    Chloride 100 (*)    Glucose, Bld 190 (*)    Creatinine, Ser 2.47 (*)  GFR calc non Af Amer 20 (*)    GFR calc Af Amer 23 (*)    All other components within normal limits  BRAIN NATRIURETIC PEPTIDE  I-STAT TROPOININ, ED    EKG  EKG Interpretation  Date/Time:  Tuesday Aug 23 2016 01:16:39 EDT Ventricular Rate:  77 PR Interval:    QRS Duration: 98 QT Interval:  396 QTC Calculation: 449 R Axis:   84 Text Interpretation:  Sinus rhythm Borderline right axis deviation Low voltage, precordial leads Borderline T abnormalities, anterior leads No significant change since last tracing Confirmed by Thayer Jew [phone]) on 08/23/2016 3:22:22 AM      Radiology Dg Chest 2 View  Result Date: 08/23/2016 CLINICAL DATA:  Acute onset of shortness of breath. Initial encounter. EXAM: CHEST  2 VIEW COMPARISON:  Chest radiograph performed 07/18/2016 FINDINGS: A moderate left pleural effusion is noted. Vascular congestion is noted, with bibasilar airspace opacities. This may reflect asymmetric pulmonary edema or pneumonia. No pneumothorax is seen. The heart is enlarged. No acute osseous abnormalities are identified. Postoperative change is noted about the upper abdomen. IMPRESSION: Moderate left pleural effusion. Vascular congestion and cardiomegaly. Bibasilar airspace opacities may reflect asymmetric pulmonary edema or pneumonia. Followup PA and lateral chest X-ray is recommended in 3-4 weeks following treatment to ensure resolution and exclude underlying malignancy. Electronically Signed   By: Garald Balding M.D.   On: 08/23/2016 02:21   Dg Abd 1 View  Result Date: 08/23/2016 CLINICAL DATA:  No bowel movement for 18 days.  Abdominal pain . EXAM: ABDOMEN - 1 VIEW COMPARISON:  CT chest 08/23/2016.  Abdominal series 04/15/2016. FINDINGS: Surgical clips noted in the mid upper abdomen. Large amount of stool is again noted throughout the colon  suggesting constipation. Interposition of colon under the right hemidiaphragm again noted. No bowel distention. No free air. Degenerative changes lumbar spine. Basilar atelectasis and left pleural effusion again noted . IMPRESSION: 1. Large amount of stool noted throughout the colon suggesting constipation. 2. Bibasilar atelectasis and left-sided pleural effusion again noted . Electronically Signed   By: Marcello Moores  Register   On: 08/23/2016 07:15   Ct Chest Wo Contrast  Result Date: 08/23/2016 CLINICAL DATA:  Shortness of breath. Home oxygen. Three breathing treatments without relief. History of CHF and COPD. EXAM: CT CHEST WITHOUT CONTRAST TECHNIQUE: Multidetector CT imaging of the chest was performed following the standard protocol without IV contrast. COMPARISON:  07/18/2016 FINDINGS: Cardiovascular: Normal heart size. No pericardial effusions. Coronary artery and aortic calcifications. No aortic aneurysm. Central pulmonary arteries are prominent suggesting arterial hypertension. Mediastinum/Nodes: Scattered lymph nodes are not pathologically enlarged. Esophagus is decompressed. Postoperative changes at the EG junction. Lungs/Pleura: Moderate size left pleural effusion. Atelectasis or consolidation in the left lung base. 11 mm and 6 mm nodules in the left upper lung without change since prior study. No pneumothorax. Emphysematous changes demonstrated in the lungs. Airways are patent. Upper Abdomen: Surgical clips in the upper abdomen. No acute process identified on limited evaluation. Musculoskeletal: Degenerative changes in the spine. No destructive bone lesions. IMPRESSION: *Moderate left pleural effusion with consolidation and volume loss in the lung base. Left upper lung nodules, largest measuring 11 mm diameter, similar to prior study. Metastasis not excluded. *Vascular calcifications. Central pulmonary arterial prominence consistent with pulmonary arterial hypertension. Electronically Signed   By: Lucienne Capers M.D.   On: 08/23/2016 04:26    Procedures Procedures   Medications Ordered in ED Medications  acetaminophen-codeine (TYLENOL #3) 300-30 MG per tablet 1-2 tablet (  not administered)  albuterol (PROVENTIL) (2.5 MG/3ML) 0.083% nebulizer solution 2.5 mg (not administered)  aspirin chewable tablet 81 mg (not administered)  buPROPion (WELLBUTRIN SR) 12 hr tablet 150 mg (not administered)  docusate sodium (COLACE) capsule 50 mg (not administered)  pantoprazole (PROTONIX) EC tablet 40 mg (not administered)  predniSONE (DELTASONE) tablet 40 mg (not administered)  ipratropium-albuterol (DUONEB) 0.5-2.5 (3) MG/3ML nebulizer solution 3 mL (3 mLs Nebulization Given 08/23/16 0620)  furosemide (LASIX) tablet 40 mg (not administered)  heparin injection 5,000 Units (5,000 Units Subcutaneous Given 08/23/16 0634)  budesonide (PULMICORT) nebulizer solution 0.5 mg (not administered)  senna (SENOKOT) tablet 17.2 mg (not administered)  polyethylene glycol (MIRALAX / GLYCOLAX) packet 17 g (not administered)  albuterol (PROVENTIL) (2.5 MG/3ML) 0.083% nebulizer solution 5 mg (5 mg Nebulization Given 08/23/16 0209)  methylPREDNISolone sodium succinate (SOLU-MEDROL) 125 mg/2 mL injection 80 mg (80 mg Intravenous Given 08/23/16 0211)  ipratropium-albuterol (DUONEB) 0.5-2.5 (3) MG/3ML nebulizer solution 3 mL (3 mLs Nebulization Given 08/23/16 0209)   Initial Impression / Assessment and Plan / ED Course  I have reviewed the triage vital signs and the nursing notes.  Pertinent labs & imaging results that were available during my care of the patient were reviewed by me and considered in my medical decision making (see chart for details).     Patient presents with dyspnea on exertion. Does not feel that she is volume overloaded. History of chronic respiratory failure on 4 L of home O2. She had some improvement with nebulizer by EMS. She is in no respiratory distress. Basic labwork obtained. EKG reassuring. Troponin  negative. Chest x-ray shows reaccumulation of left pleural effusion. She previously had this tapped. She has no infectious symptoms including fever. White count is normal. Doubt infectious process. CT scan shows possible nodularity concerning for possible tumor recurrence. On recheck, patient reports some improvement with DuoNeb and Solu-Medrol. However, she reports continued severe dyspnea on exertion with very minimal ambulation. For this reason, will admit for further workup and management. Feel her acute on chronic respiratory failure is likely multifactorial and she may have an acute COPD exacerbation as well as reaccumulation of this pleural fluid.   Final Clinical Impressions(s) / ED Diagnoses   Final diagnoses:  Acute on chronic respiratory failure with hypoxia (HCC)  Pleural effusion  COPD with exacerbation (HCC)  Constipation   New Prescriptions New Prescriptions   No medications on file   I personally performed the services described in this documentation, which was scribed in my presence. The recorded information has been reviewed and is accurate.     Merryl Hacker, MD 08/23/16 [phone]

## 2016-08-23 NOTE — H&P (Signed)
Date: 08/23/2016               Patient Name:  [name] MRN: [id number]  DOB: 20-Sep-1955 Age / Sex: 61 y.o., female   PCP: Oval Linsey, MD         Medical Service: Internal Medicine Teaching Service         Attending Physician: Dr. Aldine Contes    First Contact: Dr. Einar Gip Pager: [phone]  Second Contact: Dr. Ignacia Marvel Pager: [phone]       After Hours (After 5p/  First Contact Pager: [phone]  weekends / holidays): Second Contact Pager: [phone]   Chief Complaint: Shortness of breath History of Present Illness: [name] is a 61 y.o. 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 Typically she is able to walk between rooms at home without becoming short of breath, but for the last 2 days she has been barely able to walk to the bathroom and has to sit and rest for 5-10 minutes. She reports her wheezing has been more prominent although she has experienced no worsening or productive cough. She reports subjective fevers and chills and persistent aching pain in her left lower thorax. She reports that she has been having difficulty with her insurance covering her nebulizers/inhalers, and is concerned she may not be able to afford some of them. Of note she reports significant constipation, abdominal distention, and has had no bowel movement for 18 days despite taking Colace twice daily. She reports loss of appetite but has been drinking fluids, mainly [facility] She denies chest pain, leg swelling, weight gain, worsening of her baseline orthopnea, sick contacts, or URI symptoms. She could not catch her breath after walking to the bathroom last night and called EMS.  In the ED she was  afebrile, normotensive, but tachypneic with respiratory rate 27 and maintaining SpO2 96% on 4L O2. She received Albuterol, Duoneb, and Solumedrol and per EDP continued to remain hypoxic on ambulation. Labs remarkable for Cr 2.47, otherwise CBC unremarkable, BNP 87, trop 0.0. CXR showed moderate left pleural effusion and vascular congestion and cardiomegaly as well as bibasilar airpace opacities. CT chest redemonstrated moderate left pleural effusion with consolidation and volume loss, left upper lung nodules similar to prior, and changes from [facility]. IMTS contacted for admission.   Meds:  Current Meds  Medication Sig  . acetaminophen-codeine (TYLENOL #3) 300-30 MG tablet Take 1-2 tablets by mouth every 6 (six) hours as needed for severe pain.  Marland Kitchen albuterol (VENTOLIN HFA) 108 (90 Base) MCG/ACT inhaler Inhale 1-2 puffs into the lungs every 6 (six) hours as needed for shortness of breath.  Marland Kitchen aspirin 81 MG chewable tablet Chew 1 tablet (81 mg total) by mouth at bedtime.  . budesonide (PULMICORT) 0.5 MG/2ML nebulizer solution Take 2 mLs (0.5 mg total) by nebulization 2 (two) times daily.  Marland Kitchen buPROPion (WELLBUTRIN SR) 150 MG 12 hr tablet Take 1 tablet (150 mg total) by mouth 2 (two) times daily.  Marland Kitchen docusate sodium (COLACE) 50 MG capsule Take 1 capsule (50 mg total) by mouth 2 (two) times daily.  . furosemide (LASIX) 40 MG tablet Take 80 mg by mouth daily.  Marland Kitchen gabapentin (NEURONTIN) 300 MG capsule Take 2 capsules (  600 mg total) by mouth at bedtime.  . metolazone (ZAROXOLYN) 2.5 MG tablet TAKE 1 TABLET BY MOUTH AS NEEDED(FOR 5 LBS WEIGHT GAIN)  . pantoprazole (PROTONIX) 40 MG tablet Take 1 tablet (40 mg total) by mouth daily.  . ranitidine (ZANTAC) 300 MG tablet Take 1 tablet (300 mg total) by mouth at bedtime.  . rosuvastatin (CRESTOR) 20 MG tablet Take 1 tablet (20 mg total) by mouth daily.  Marland Kitchen umeclidinium bromide (INCRUSE ELLIPTA) 62.5 MCG/INH AEPB Inhale 1 puff into the lungs daily.  . vitamin B-12  (CYANOCOBALAMIN) 1000 MCG tablet Take 1 tablet (1,000 mcg total) by mouth daily.  . Vitamin D, Ergocalciferol, (DRISDOL) 50000 units CAPS capsule Take 1 capsule (50,000 Units total) by mouth every 7 (seven) days.    Allergies: Allergies as of 08/23/2016 - Review Complete 08/23/2016  Allergen Reaction Noted  . Fluconazole Anaphylaxis and Itching   . Atorvastatin Other (See Comments) 09/09/2014   Past Medical History:  Diagnosis Date  . Arthritis    "some scattered" (03/03/2015)  . CHF (congestive heart failure) ([place])   . Chronic bronchitis ([place])    "get it most years" (03/03/2015)  . COPD (chronic obstructive pulmonary disease) (HCC)    Severe. Gold Stage IV.  PFTs (12/2008) - severe obstructive airway disease. Active tobacco use. Requires 4L O2 at home.  . Coronary artery disease    S/P PCI of LAD with DES (12/2008). Total occlusion of RCA noted at that time., medically managed. ACS ruled out 03/2009 with Lexiscan myoview . Followed by [facility].  . Depression   . Diastolic dysfunction    2-D Echo (12/2008) - Normal LV Systolic funciton with EF 60-65%. Grade 1 diastolid dysfunction. No regional wall motion abnormalities. Moderate pulmonary HTN with PA peak pressure 11mHg.  . Full dentures   . GERD (gastroesophageal reflux disease)    S/P Nissen fundoplication.  .Marland KitchenHistory of hiatal hernia   . Hx MRSA infection    Recurrent MRSA thigh abscesses.  . Hyperlipidemia   . [place]    "[place]"  . Obesity   . On home oxygen therapy since 2010   4L all the time  . Pneumonia   . Prediabetes    HgbA1c 6.4 (12/2008)  . Pulmonary hypertension ([place]    2-D Echo (019/4174 - Systolic pressure was moderately increased. PA peak pressure  582mg. 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 Followed by Dr. WrJoya GaskinsLVelora Heckler . Pulmonary nodule, right    Small right middle lobe nodule.  Stable as of 12/2008.  . Marland Kitchenhortness of breath dyspnea    with a lot of exertion; if fluid builds up    Family History:  Family History  Problem Relation Age of Onset  . Heart disease Mother 4732     Deceased from MI at 62yo. Hypertension Mother   . Heart disease Father 5448     Deceased of MI age 62yo. Hypertension Father   . Hypertension Brother   . Lung cancer Unknown        Grandmother    Social History:  Social History   Social History  . Marital status: Divorced    Spouse name: N/A  . Number of children: N/A  . Years of education: N/A   Occupational History  . Not on file.   Social History Main Topics  . Smoking status:  Former Smoker    Packs/day: 1.00    Years: 45.00    Types: Cigarettes    Start date: 04/04/1969    Quit date: 11/04/2015  . Smokeless tobacco: Never Used     Comment: 1 cig per day  . Alcohol use No  . Drug use: No  . Sexual activity: Not Currently    Birth control/ protection: None   Other Topics Concern  . Not on file   Social History Narrative   Formerly worked as a Scientist, water quality, now disabled.   Divorced.   2 grown children.   Lives with her grandson.    Review of Systems: A complete ROS was negative except as per HPI.   Physical Exam: Blood pressure 105/73, pulse 96, temperature 98.5 F (36.9 C), temperature source Oral, resp. rate 17, height _0  (1.651 m), weight 260 lb (117.9 kg), SpO2 97 %.  General appearance: Obese woman resting in bed, in mild distress, speaking in short sentences HENT: Normocephalic, moist mucous membranes, neck supple, oropharynx clear Eyes: PERRL, non-icteric Cardiovascular: Regular rate and rhythm, no murmurs, rubs, gallops Respiratory: Minimal breath sounds in left lower lung fields, otherwise diminished, no audible rhonchi or wheezing, mildly increased work of breathing Abdomen: Obese and moderately distended, BS+, mildly tense and tympanic, mild diffuse tenderness Extremities: Normal bulk and range  of motion, no edema, 2+ peripheral pulses Skin: Warm, dry, intact, decreased skin turgor Neuro: Alert and oriented Psych: Appropriate affect, clear speech, thoughts linear and goal-directed   Assessment & Plan by Problem: Principal Problem:   COPD exacerbation (HCC) Active Problems:   Constipation due to slow transit   Pleural effusion  COPD exacerbation, progressive dyspnea on exertion and wheezing over the last two days with subjective fevers and chills, unresponsive to home nebulizers, remains hypoxic on ambulation in ED. Gold stage IV COPD on 4L baseline. Breathing also likely worsened by reaccumulated L pleural effusion and constipation with pronounced abdominal distension as well. S/p solumedrol in ED. -- Prednisone 40 mg x5 days -- Duonebs Q4H scheduled -- Albuterol Q6H PRN -- home Budesonide neb BID  Recurrent left pleural effusion, last thoracentesis 07/18/16 removed 700 cc fluid, studies consistent with exudative effusion however cytology revealed no malignant cells. Given history of LLL adenocarcinoma, recurrence of malignancy remains a concern. She has not yet followed up with her oncologist Dr. Earlie Server. Pleural fluid appears to have re-accumulated to similar volume as hospitalization in April, likely impairing her respiratory status of which she has little reserve. She is also experiencing back pain and pressure at the location of her pleural effusion. -- IR Thoracentesis with fluid studies while inpatient  Severe constipation, no BM in 18 days despite taking Colace BID. Abdominal distension and loss of appetite. No N/V. -- Obtain KUB -- Add Senna 2 tablets BID -- Add Miralax BID -- continue home Colace BID  HFpEF, no new weight gain, edema, orthopnea despite CXR findings, BNP also normal -- home Lasix 40 mg daily  History of tobacco abuse, quit in 2017 -- Continue home Wellbutrin 164m BID  GERD -- Protonix 40 mg daily  CAD -- Aspirin 81 mg daily  FEN/GI: Regular  diet, replete electrolytes as needed  DVT ppx: Heparin TID  Code status: after discussion with patient she requests DNR/DNI status  Dispo: Admit patient to Observation with expected length of stay less than 2 midnights.  Signed: JAsencion Partridge MD 08/23/2016, 5:31 AM  Pager: [phone]

## 2016-08-23 NOTE — ED Notes (Signed)
Patient transported to X-ray 

## 2016-08-23 NOTE — ED Notes (Signed)
Attempted to call report.

## 2016-08-23 NOTE — Progress Notes (Signed)
RN ad patient has questions about a medication that;s not ordered, diet recommendation, and insulin coverage. MD notified.

## 2016-08-23 NOTE — Progress Notes (Signed)
Price check Trelegy (fluticasone-umeclidium-vilanterol), $8 copay.

## 2016-08-23 NOTE — Plan of Care (Signed)
Problem: Tissue Perfusion: Goal: Risk factors for ineffective tissue perfusion will decrease Outcome: Progressing Patient requires oxygen at baseline, maintaining sats on 4 liters as per home   Problem: Bowel/Gastric: Goal: Will not experience complications related to bowel motility Outcome: Progressing Patient with 2 small BM's this shift, took Senokot, refused suppository

## 2016-08-24 ENCOUNTER — Other Ambulatory Visit: Payer: Self-pay | Admitting: Internal Medicine

## 2016-08-24 ENCOUNTER — Encounter (HOSPITAL_COMMUNITY): Payer: Self-pay | Admitting: General Surgery

## 2016-08-24 ENCOUNTER — Observation Stay (HOSPITAL_COMMUNITY): Payer: Medicare HMO

## 2016-08-24 DIAGNOSIS — J948 Other specified pleural conditions: Secondary | ICD-10-CM | POA: Diagnosis not present

## 2016-08-24 DIAGNOSIS — J441 Chronic obstructive pulmonary disease with (acute) exacerbation: Secondary | ICD-10-CM | POA: Diagnosis not present

## 2016-08-24 DIAGNOSIS — Z79899 Other long term (current) drug therapy: Secondary | ICD-10-CM | POA: Diagnosis not present

## 2016-08-24 DIAGNOSIS — R0602 Shortness of breath: Secondary | ICD-10-CM | POA: Diagnosis not present

## 2016-08-24 DIAGNOSIS — Z85118 Personal history of other malignant neoplasm of bronchus and lung: Secondary | ICD-10-CM | POA: Diagnosis not present

## 2016-08-24 DIAGNOSIS — J90 Pleural effusion, not elsewhere classified: Secondary | ICD-10-CM | POA: Diagnosis not present

## 2016-08-24 DIAGNOSIS — K5900 Constipation, unspecified: Secondary | ICD-10-CM | POA: Diagnosis not present

## 2016-08-24 HISTORY — PX: IR THORACENTESIS ASP PLEURAL SPACE W/IMG GUIDE: IMG5380

## 2016-08-24 LAB — PROTIME-INR
INR: 1.07
Prothrombin Time: 13.9 seconds (ref 11.4–15.2)

## 2016-08-24 LAB — GRAM STAIN

## 2016-08-24 LAB — LACTATE DEHYDROGENASE, PLEURAL OR PERITONEAL FLUID: LD FL: 86 U/L — AB (ref 3–23)

## 2016-08-24 LAB — CBC
HEMATOCRIT: 30.2 % — AB (ref 36.0–46.0)
HEMOGLOBIN: 9 g/dL — AB (ref 12.0–15.0)
MCH: 28.5 pg (ref 26.0–34.0)
MCHC: 29.8 g/dL — ABNORMAL LOW (ref 30.0–36.0)
MCV: 95.6 fL (ref 78.0–100.0)
Platelets: 236 10*3/uL (ref 150–400)
RBC: 3.16 MIL/uL — AB (ref 3.87–5.11)
RDW: 13.6 % (ref 11.5–15.5)
WBC: 16 10*3/uL — ABNORMAL HIGH (ref 4.0–10.5)

## 2016-08-24 LAB — BASIC METABOLIC PANEL
Anion gap: 9 (ref 5–15)
BUN: 23 mg/dL — AB (ref 6–20)
CHLORIDE: 98 mmol/L — AB (ref 101–111)
CO2: 32 mmol/L (ref 22–32)
Calcium: 8.8 mg/dL — ABNORMAL LOW (ref 8.9–10.3)
Creatinine, Ser: 2.49 mg/dL — ABNORMAL HIGH (ref 0.44–1.00)
GFR calc Af Amer: 23 mL/min — ABNORMAL LOW (ref >60)
GFR calc non Af Amer: 20 mL/min — ABNORMAL LOW (ref >60)
GLUCOSE: 187 mg/dL — AB (ref 65–99)
POTASSIUM: 4.4 mmol/L (ref 3.5–5.1)
Sodium: 139 mmol/L (ref 135–145)

## 2016-08-24 LAB — GLUCOSE, CAPILLARY
GLUCOSE-CAPILLARY: 156 mg/dL — AB (ref 65–99)
GLUCOSE-CAPILLARY: 211 mg/dL — AB (ref 65–99)

## 2016-08-24 LAB — PROTEIN, PLEURAL OR PERITONEAL FLUID: TOTAL PROTEIN, FLUID: 3.8 g/dL

## 2016-08-24 LAB — PROTEIN, TOTAL: Total Protein: 5.9 g/dL — ABNORMAL LOW (ref 6.5–8.1)

## 2016-08-24 LAB — LACTATE DEHYDROGENASE: LDH: 126 U/L (ref 98–192)

## 2016-08-24 MED ORDER — PREDNISONE 20 MG PO TABS: 40.0000 mg | ORAL_TABLET | Freq: Every day | ORAL | 0 refills | Status: AC

## 2016-08-24 MED ORDER — LIDOCAINE HCL 1 % IJ SOLN
INTRAMUSCULAR | Status: DC | PRN
  Administered 2016-08-24: 10 mL

## 2016-08-24 MED ORDER — FUROSEMIDE 40 MG PO TABS: 40.0000 mg | ORAL_TABLET | Freq: Every day | ORAL | 0 refills | Status: AC

## 2016-08-24 MED ORDER — SENNA 8.6 MG PO TABS: 2.0000 | ORAL_TABLET | Freq: Every day | ORAL | 0 refills | Status: AC | PRN

## 2016-08-24 MED ORDER — LIDOCAINE HCL 1 % IJ SOLN
INTRAMUSCULAR | Status: AC
  Filled 2016-08-24: qty 20

## 2016-08-24 MED ORDER — POLYETHYLENE GLYCOL 3350 17 G PO PACK: 17.0000 g | PACK | Freq: Every day | ORAL | 0 refills | Status: AC | PRN

## 2016-08-24 MED ORDER — IPRATROPIUM-ALBUTEROL 0.5-2.5 (3) MG/3ML IN SOLN
3.0000 mL | Freq: Four times a day (QID) | RESPIRATORY_TRACT | Status: DC
  Administered 2016-08-24 (×3): 3 mL via RESPIRATORY_TRACT
  Filled 2016-08-24 (×3): qty 3

## 2016-08-24 MED ORDER — FLUTICASONE-UMECLIDIN-VILANT 100-62.5-25 MCG/INH IN AEPB: 1.0000 | INHALATION_SPRAY | Freq: Every day | RESPIRATORY_TRACT | 2 refills | Status: DC

## 2016-08-24 NOTE — Progress Notes (Signed)
Faxed home oxygen order to [facility] [phone] along with face sheet and H&P at patientrequest to assist with transferring oxygen to [place]. Patient would like to wait until the first of next month. Per Ronnell Guadalajara with [facility] they will obtain qualifying saturations when they initiate service as our would expire in 48 hours.

## 2016-08-24 NOTE — Procedures (Signed)
Ultrasound-guided diagnostic and therapeutic left thoracentesis performed yielding 0.65 liters of serosanguineous colored fluid. No immediate complications. Follow-up chest x-ray pending.       Yoshito Gaza E

## 2016-08-24 NOTE — Plan of Care (Signed)
Problem: Activity: Goal: Capacity to carry out activities will improve Outcome: Completed/Met Date Met: 08/24/16 At baseline, dyspnea with any exertion

## 2016-08-24 NOTE — Progress Notes (Signed)
Inpatient Diabetes Program Recommendations  AACE/ADA: New Consensus Statement on Inpatient Glycemic Control (2015)  Target Ranges:  Prepandial:   less than 140 mg/dL      Peak postprandial:   less than 180 mg/dL (1-2 hours)      Critically ill patients:  140 - 180 mg/dL   Results for [name], [name] (MRN [id number]) as of 08/24/2016 09:52  Ref. Range 08/23/2016 08:28 08/23/2016 11:51 08/23/2016 16:34 08/23/2016 21:21 08/24/2016 07:48  Glucose-Capillary Latest Ref Range: 65 - 99 mg/dL 349 (H) 283 (H) 270 (H) 229 (H) 156 (H)    Review of Glycemic Control  Current orders for Inpatient glycemic control: Novolog 0-9 units TID with meals, Novolog 0-5 units QHS  Inpatient Diabetes Program Recommendations: Insulin - Meal Coverage: Post prandial glucose consistently elevated. If steroids are continued, please consider ordering Novolog 4 units TID with meals for meal coverage.  Thanks, Barnie Alderman, RN, MSN, CDE Diabetes Coordinator Inpatient Diabetes Program [phone] (Team Pager from 8am to 5pm)

## 2016-08-24 NOTE — Discharge Summary (Signed)
Name: [name] MRN: [id number] DOB: [birth year] 61 y.o. PCP: Oval Linsey, MD  Date of Admission: 08/23/2016  1:05 AM Date of Discharge: 08/24/2016 Attending Physician: Dr. Orie Fisherman  Discharge Diagnosis: 1. COPD Exacerbation 2. Pleural Effusion 3. Constipation 4. Rib fractures  Principal Problem:   COPD exacerbation (HCC) Active Problems:   Constipation due to slow transit   Pleural effusion   Discharge Medications: Allergies as of 08/24/2016      Reactions   Fluconazole Anaphylaxis, Itching   Atorvastatin Other (See Comments)   Reaction:  Dizziness       Medication List    STOP taking these medications   arformoterol 15 MCG/2ML Nebu Commonly known as:  BROVANA   budesonide 0.5 MG/2ML nebulizer solution Commonly known as:  PULMICORT   docusate sodium 50 MG capsule Commonly known as:  COLACE   umeclidinium bromide 62.5 MCG/INH Aepb Commonly known as:  INCRUSE ELLIPTA     TAKE these medications   acetaminophen-codeine 300-30 MG tablet Commonly known as:  TYLENOL #3 Take 1-2 tablets by mouth every 6 (six) hours as needed for severe pain.   albuterol 108 (90 Base) MCG/ACT inhaler Commonly known as:  VENTOLIN HFA Inhale 1-2 puffs into the lungs every 6 (six) hours as needed for shortness of breath.   aspirin 81 MG chewable tablet Chew 1 tablet (81 mg total) by mouth at bedtime.   buPROPion 150 MG 12 hr tablet Commonly known as:  WELLBUTRIN SR Take 1 tablet (150 mg total) by mouth 2 (two) times daily.   Fluticasone-Umeclidin-Vilant 100-62.5-25 MCG/INH Aepb Commonly known as:  TRELEGY ELLIPTA Inhale 1 puff into the lungs daily.   furosemide 40 MG tablet Commonly known as:  LASIX Take 1 tablet (40 mg total) by mouth daily. Start taking on:  08/25/2016 What changed:  how much to take   gabapentin 300 MG capsule Commonly known as:  NEURONTIN Take 2 capsules (600 mg total) by mouth at bedtime.   metolazone 2.5 MG tablet Commonly known as:   ZAROXOLYN TAKE 1 TABLET BY MOUTH AS NEEDED(FOR 5 LBS WEIGHT GAIN)   pantoprazole 40 MG tablet Commonly known as:  PROTONIX Take 1 tablet (40 mg total) by mouth daily.   polyethylene glycol packet Commonly known as:  MIRALAX / GLYCOLAX Take 17 g by mouth daily as needed for moderate constipation or severe constipation.   potassium chloride SA 20 MEQ tablet Commonly known as:  K-DUR,KLOR-CON Take 1 tablet (20 mEq total) by mouth daily.   predniSONE 20 MG tablet Commonly known as:  DELTASONE Take 2 tablets (40 mg total) by mouth daily with breakfast. Start taking on:  08/25/2016   ranitidine 300 MG tablet Commonly known as:  ZANTAC Take 1 tablet (300 mg total) by mouth at bedtime.   rosuvastatin 20 MG tablet Commonly known as:  CRESTOR Take 1 tablet (20 mg total) by mouth daily.   senna 8.6 MG Tabs tablet Commonly known as:  SENOKOT Take 2 tablets (17.2 mg total) by mouth daily as needed for mild constipation or moderate constipation.   vitamin B-12 1000 MCG tablet Commonly known as:  CYANOCOBALAMIN Take 1 tablet (1,000 mcg total) by mouth daily.   Vitamin D (Ergocalciferol) 50000 units Caps capsule Commonly known as:  DRISDOL Take 1 capsule (50,000 Units total) by mouth every 7 (seven) days.            Durable Medical Equipment        Start     Ordered  08/24/16 1124  For home use only DME oxygen  Once    Question Answer Comment  Mode or (Route) Nasal cannula   Frequency Continuous (stationary and portable oxygen unit needed)   Oxygen conserving device Yes   Oxygen delivery system Gas      08/24/16 1125      Disposition and follow-up:   [name] was discharged from [facility] in stable ondition.  At the hospital follow up visit please address:  1.  COPD: Please ensure patients compliance with her new 3-in-1 inhaler (Trelegy) OR her prior nebulizer regimen. She noted that her insurance would not cover treatments via nebulizer and  pharmacy kindly assisted with working with her insurance company to obtain this inhaler. She was also discharged home with a course of prednisone to complete a 5 day course. Please ensure compliance. Please ensure patient is back to her baseline oxygen requirement of 4 L continuously.  Left pleural effusion: Studies so far show exudative process. Cytology has been sent for analysis, please follow-up.  History of left lung cancer, concern for recurrence. Please ensure patient has followed up with her oncologist at her regularly scheduled appointment  2.  Labs / imaging needed at time of follow-up: None  3.  Pending labs/ test needing follow-up: Please follow up pleural fluid analysis, and cytology.  Follow-up Appointments: Follow-up Information    Oval Linsey, MD. Go on 08/31/2016.   Specialty:  Internal Medicine Why:  Please call and make an appointment for follow-up within 1 week of discharge._0 :45pm  Contact information: [address] [place] [address] [phone]          Hospital Course by problem list: Principal Problem:   COPD exacerbation ([place]) Active Problems:   Constipation due to slow transit   Pleural effusion   1. Shortness of breath, pleural effusion, COPD exacerbation [name] is a 61 year old female with GOLD stage IV COPD, history of left lung cancer status post radiotherapy admitted 08/23/16 with a 2 day history of progressive shortness of breath, wheezing and dyspnea on exertion refractory to her home nebulizer treatments. She improved with steroids and breathing treatments. On presentation she was noted to have a recurrent left-sided pleural effusion on chest x-ray which she has had several thoracentesis in the past, most recently 07/18/16 and urinalysis shows fluid has been historically exudative. There were no malignant cells detected. During admission she had repeat thoracentesis and fluid was sent for analysis and cytology. Her respiratory status had  returned to baseline following this procedure. She was discharged with steroids to complete a 5 day course as well as the TRELEGY inhaler (combined ICS, LABA, LAMA). The patient had been having difficulties obtaining appropriate treatment for COPD as her insurance would not cover nebulizer treatments.  2. Rib fractures Images were reviewed with interventional radiology at which time they discovered her fractures on the left side. These were present after her fall in February 2018 however were not appreciated on initial read  3. Constipation Prior to admission [facility] had been working with her primary care physician on relieving her several week history of constipation. She had been on a trial of oral stool softeners without success. During admission she was given MiraLAX and senna with resolution of her constipation. She had several small bowel movements prior to discharge.  Discharge Vitals:   BP 108/60 (BP Location: Left Arm)   Pulse 92   Temp 98.3 F (36.8 C) (Oral)   Resp 18  Ht _0  (1.651 m)   Wt 262 lb 8 oz (119.1 kg) Comment: scale c  SpO2 94%   BMI 43.68 kg/m   Pertinent Labs, Studies, and Procedures:  CXR: Moderate left sided pleural effusion with some vascular congestion Thoracentesis: Yielded 650 mLs which were sent for cytology and evaluation Follow-up thora CXR: no pneumothorax  Signed: Chudney Scheffler, DO 08/24/2016, 4:52 PM   Pager: [phone]

## 2016-08-24 NOTE — Progress Notes (Signed)
   Subjective: Seen and evaluated at bedside. Breathing improved and nearly back to BL. Nervous about thoracentesis. Feels ready for d/c.  Objective:  Vital signs in last 24 hours: Vitals:   08/24/16 0508 08/24/16 0800 08/24/16 0801 08/24/16 0950  BP: 117/69   113/62  Pulse: 82   93  Resp: 20     Temp: 98 F (36.7 C)     TempSrc: Oral     SpO2: 95% 98% 98% 94%  Weight: 262 lb 8 oz (119.1 kg)     Height:       Physical Exam Constitutional: Obese female, NAD, sitting upright on edge of bed. Pleasant and conversational. HEENT: Atraumatic, normocephalic. No icterus, injection or exudate.  Cardiovascular: RRR, no murmurs, rubs, or gallops were appreciated. Pulmonary/Chest: Diminished BS throughout, particularly in left lower lung field. Dullness to percussion left lower lung. Abdominal: Soft, minimally tender, non distended. +BS.  Extremities: Warm and well perfused. No edema.  Psychiatric: Normal mood and affect congruent  Assessment/Plan: Patient is a 61 yo F with pmhx significant for oxygen dependent COPD (4L) and non-small cell lung cancer, adenocarcinoma s/p curative radiotherapy in August of 2016 admitted for 1 days of worsening shortness of breath. Found to have re-accumulation of her left pleural effusion.   Shortness of Breath, COPD, Left pleural effusion, Hx of Left Lung CA: On home 4L O2. Attempted bedside thora yesterday however unsuccessful. Reviewed CT's with IR who noted rib fractures on past CT's. This is likely from patients fall earlier this year and certainly could explain her chest wall tenderness however believe patients SOB was most likely secondary to re-accumulation of her left pleural effusion. Will plan on thoracentesis today with IR and again will send fluid for cell studies and cytology. This has historically been an exudative effusion, and doubt infection/pneumonia - could be malignancy.  -Therapeutic and diagnostic thoracentesis today -Continue prednisone 40 mg  Q daily, consider d/c -Scheduled duonebs Q6H -Albuterol PRN -Consider d/c today after thora and ambulation with nursing.  Constipation: Had several small BMs overnight! RN reports she intermittently refuses laxatives/stool softeners and suppositories. She notes its because she doesn't want anything to interfere with the procedure today.  -Will continue current therapy  HFpEF, ICM: Looks euvolemic on examination and she is without bibasilar crackles.  -Continue Aspirin 81 mg daily  -PO Lasix 40 mg daily  DM Type II: On Metformin at home. -SSI  Dispo: Anticipated discharge today or tomorrow.   Fredderick Swanger, DO 08/24/2016, 7:10 AM Pager: [phone]

## 2016-08-24 NOTE — Progress Notes (Signed)
SATURATION QUALIFICATIONS: (This note is used to comply with regulatory documentation for home oxygen)  Patient Saturations on 4 liters at rest 95%   Patient Saturations on 4 liters while Ambulating = 87%  Patient Saturations on 4 Liters of oxygen while Ambulating   Please briefly explain why patient needs home oxygen: patient did desat even on her 4 liters and became short of breath, says this is her baseline shortness of breath with exertion.  O2 sats back up to 94% on 4 liters after approximately 1 minute of rest.

## 2016-08-24 NOTE — Discharge Instructions (Signed)
Ms. Dolinski, you were admitted due to shortness of breath likely due to re-accumulation of that fluid around your left lung as well as perhaps a COPD exacerbation. During admission your fluid was drained which seemed to improve your symptoms. The fluid was sent for analysis. You are being discharged home with 3 more days of Prednisone starting 08/25/2016. You are also being discharged with a prescription for a new inhaler. This inhaler is called TRELEGY and it is a combination of the inhalers that you were previously on, only in 1 simple daily dose. Your insurance will cover this inhaler with an $8 copay. Please pick this up from the pharmacy on your way home. If you are unable to obtain this inhaler, please resume using your prior maintenance inhalers until you are able to obtain it.

## 2016-08-24 NOTE — Care Management Note (Signed)
Case Management Note  Patient Details  Name: IMAJEAN MCDERMID MRN: [id number] Date of Birth: 12-29-1955  Subjective/Objective:                 Spoke with patient at the bedside. Patient is in obs for COPD exac. Patient with history of lung CA and several thoracentesis. Failed bedside thoracentesis yesterday, plan for one in IR today. Patient lives at home with 62 year old grandson in first floor apartment, daughter lives in building in same complex. Patient has home oxygen through [facility]. She states she has a $500 outstanding with them and would like to transfer service to [facility] as they are a preferred provider with [employer]. Patient has a rolator for use when needed, does not use at home. Has a nebulizer that was recently replaced. Also has [employer] to assist with premiums and medications. Currently she does not have problems obtaining her medications.    Action/Plan:  Referral placed to [facility]. Spoke with Dr Danford Bad she will place orders for desat qualifications, and home oxygen as required by [place].  Expected Discharge Date:  08/25/16               Expected Discharge Plan:  [place]  In-House Referral:     Discharge planning Services  CM Consult  Post Acute Care Choice:  Durable Medical Equipment Choice offered to:     DME Arranged:  Oxygen DME Agency:  [facility]  HH Arranged:    [place] Agency:     Status of Service:  In process, will continue to follow  If discussed at Long Length of Stay Meetings, dates discussed:    Additional Comments:  Carles Collet, RN 08/24/2016, 11:24 AM

## 2016-08-24 NOTE — Progress Notes (Signed)
Internal Medicine Attending:   I saw and examined the patient. I reviewed the resident's note and I agree with the resident's findings and plan as documented in the resident's note.  Patient feels well today with no new complaints. She states that her shortness of breath is back at her baseline and would like to go back home today. She is now status post thoracentesis and had approximately 600 mL of fluid drained. Fluid is exudative according to Lyte's criteria. We will need to follow-up fluid cytology. We will complete a five-day course of prednisone for possible COPD exacerbation. Patient has mild leukocytosis today likely secondary to initiation of steroids. Patient stable for discharge home today. She'll need close follow-up in [facility]. Repeat chest x-ray done after thoracentesis showed no evidence of pneumothorax.

## 2016-08-24 NOTE — Progress Notes (Signed)
Patient refusing all meds that will help her have a decent bowel movement, says it only makes her stomach cramp.

## 2016-08-24 NOTE — Care Management Obs Status (Signed)
MEDICARE OBSERVATION STATUS NOTIFICATION   Patient Details  Name: [name] MRN: [id number] Date of Birth: June 07, 1955   Medicare Observation Status Notification Given:  Yes    Carles Collet, RN 08/24/2016, 2:16 PM

## 2016-08-29 LAB — CULTURE, BODY FLUID W GRAM STAIN -BOTTLE: Culture: NO GROWTH

## 2016-08-31 ENCOUNTER — Ambulatory Visit: Payer: Medicare HMO

## 2016-09-01 ENCOUNTER — Other Ambulatory Visit: Payer: Medicare HMO

## 2016-09-01 ENCOUNTER — Ambulatory Visit: Payer: Medicare HMO | Admitting: Internal Medicine

## 2016-09-01 IMAGING — CR DG CHEST 2V
2 series · 2 of 2 positions shown · non-contrast
Comparison: Prior radiograph from 12/22/2013

CLINICAL DATA: Initial evaluation for shortness of breath, cough.
History of COPD. Smoker.

EXAM:
CHEST  2 VIEW

[x chest ap]
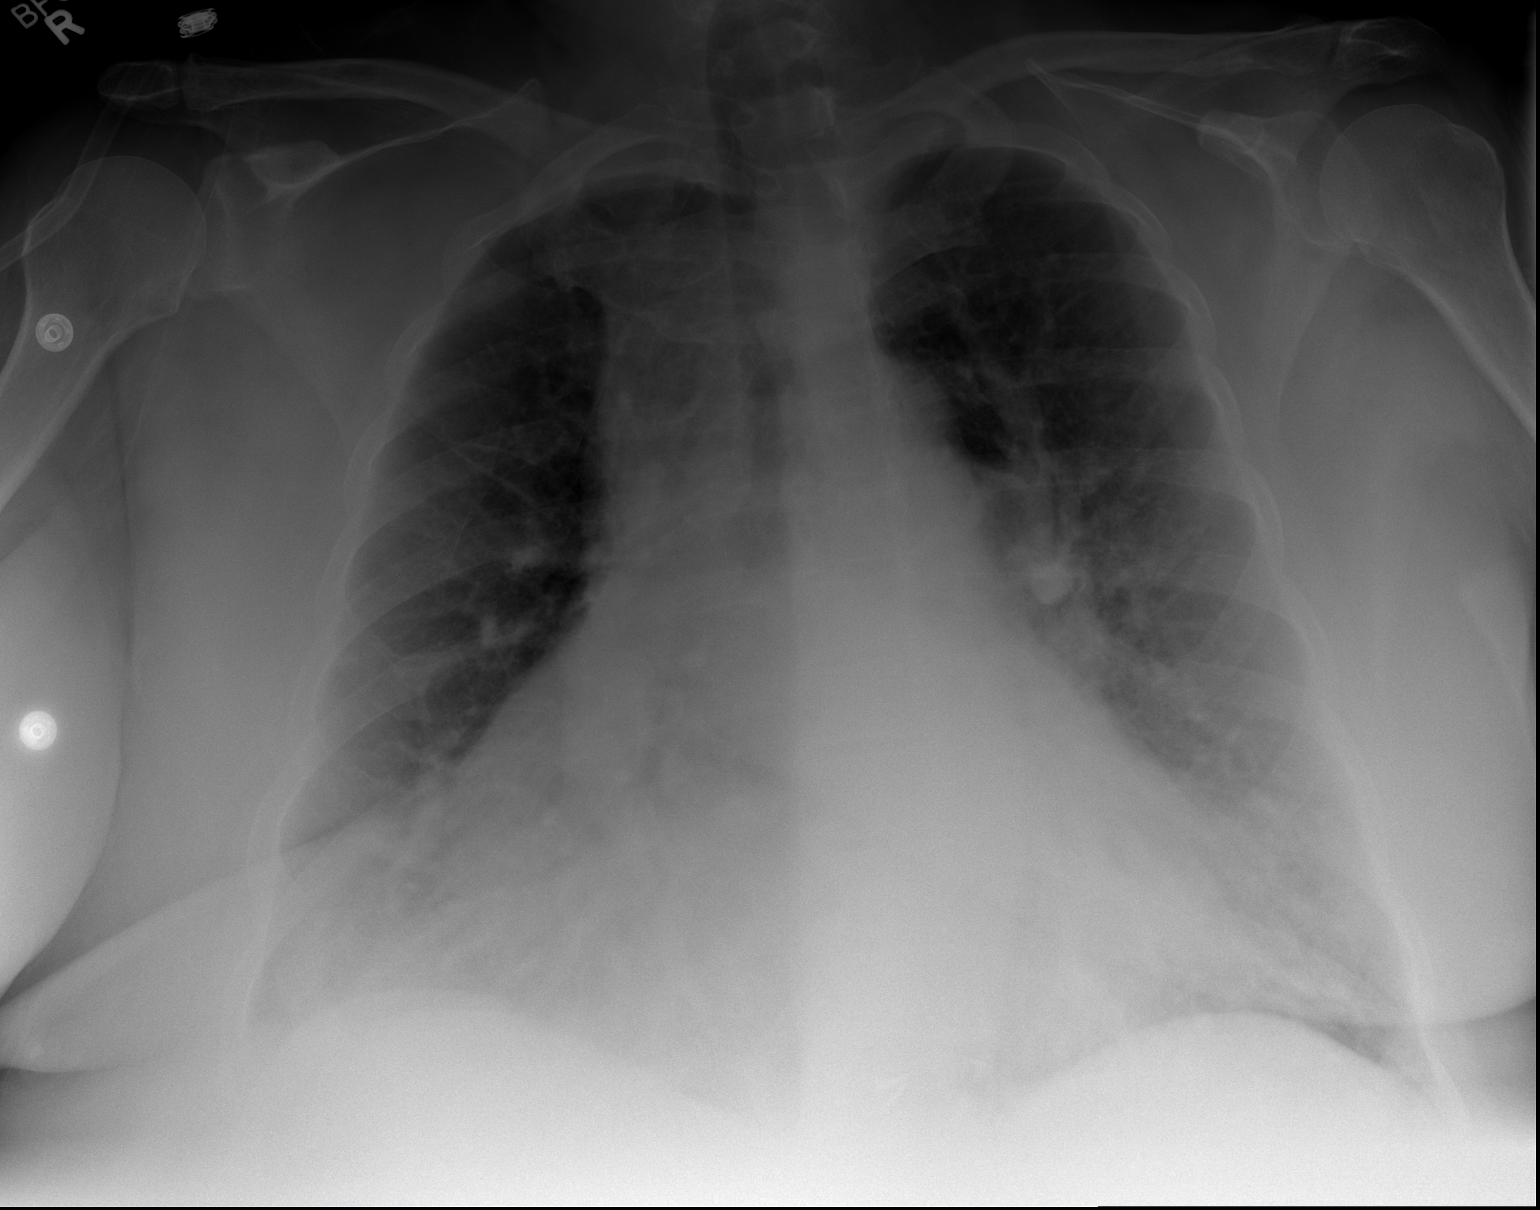

[w chest lat]
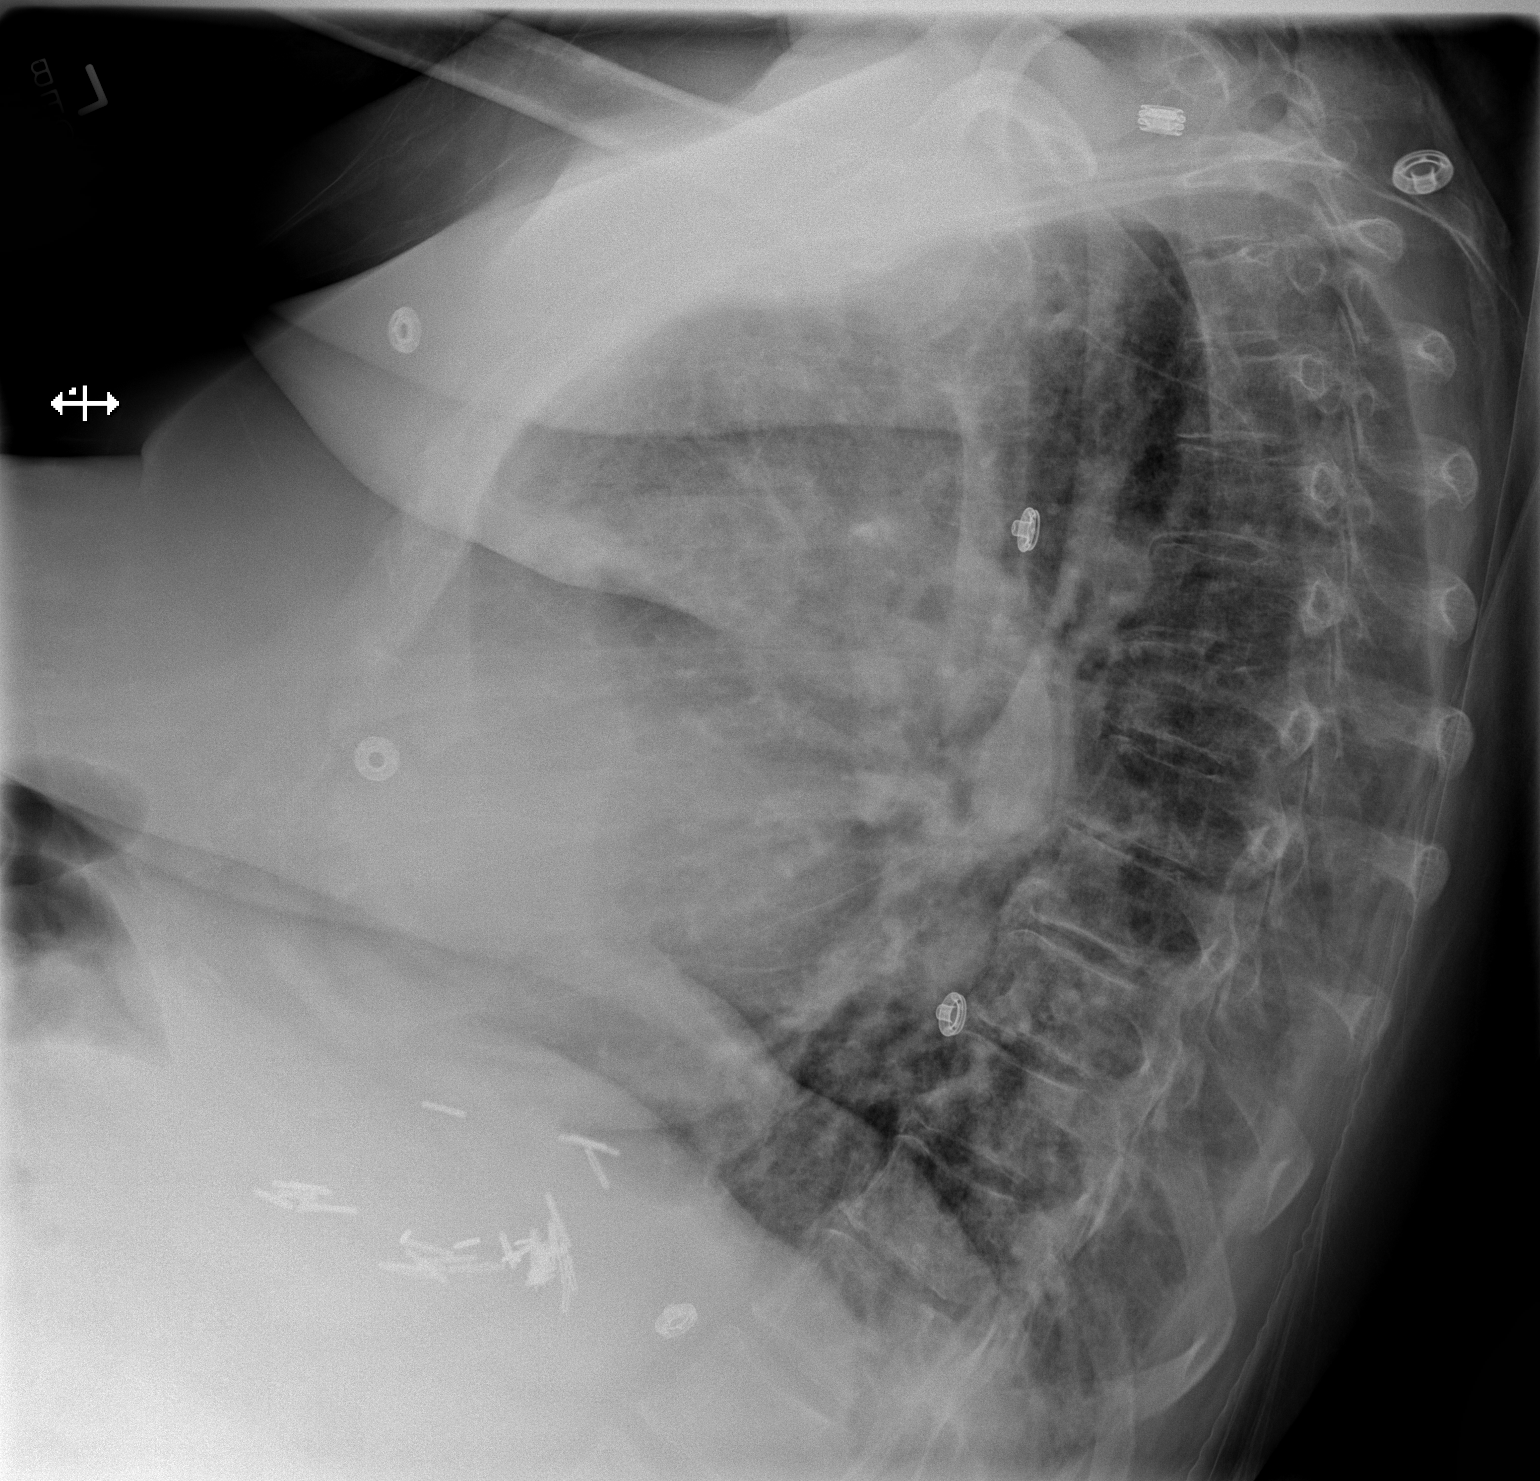

[2 of 2 positions shown; findings below may reference images not displayed]

FINDINGS: Study is limited by technique patient is rotated to the right.
Cardiomegaly is grossly stable from prior exam. Mediastinal
silhouette within normal limits.

Changes suggesting COPD again seen. No focal infiltrate to suggest
acute infectious pneumonitis. There is mild diffuse pulmonary
vascular congestion without frank pulmonary edema. No pleural
effusion. No pneumothorax.

No acute osseus abnormality. Surgical clips overlie the GE junction.
IMPRESSION: 1. Cardiomegaly with mild diffuse pulmonary vascular congestion
without overt pulmonary edema.
2. COPD.

## 2016-09-03 IMAGING — CR DG HAND COMPLETE 3+V*L*
3 series · 3 of 3 positions shown · non-contrast
Comparison: None.

CLINICAL DATA: Bilateral hand pain. No injury. Initial evaluation.

EXAM:
LEFT HAND - COMPLETE 3+ VIEW

[x hand pa left]
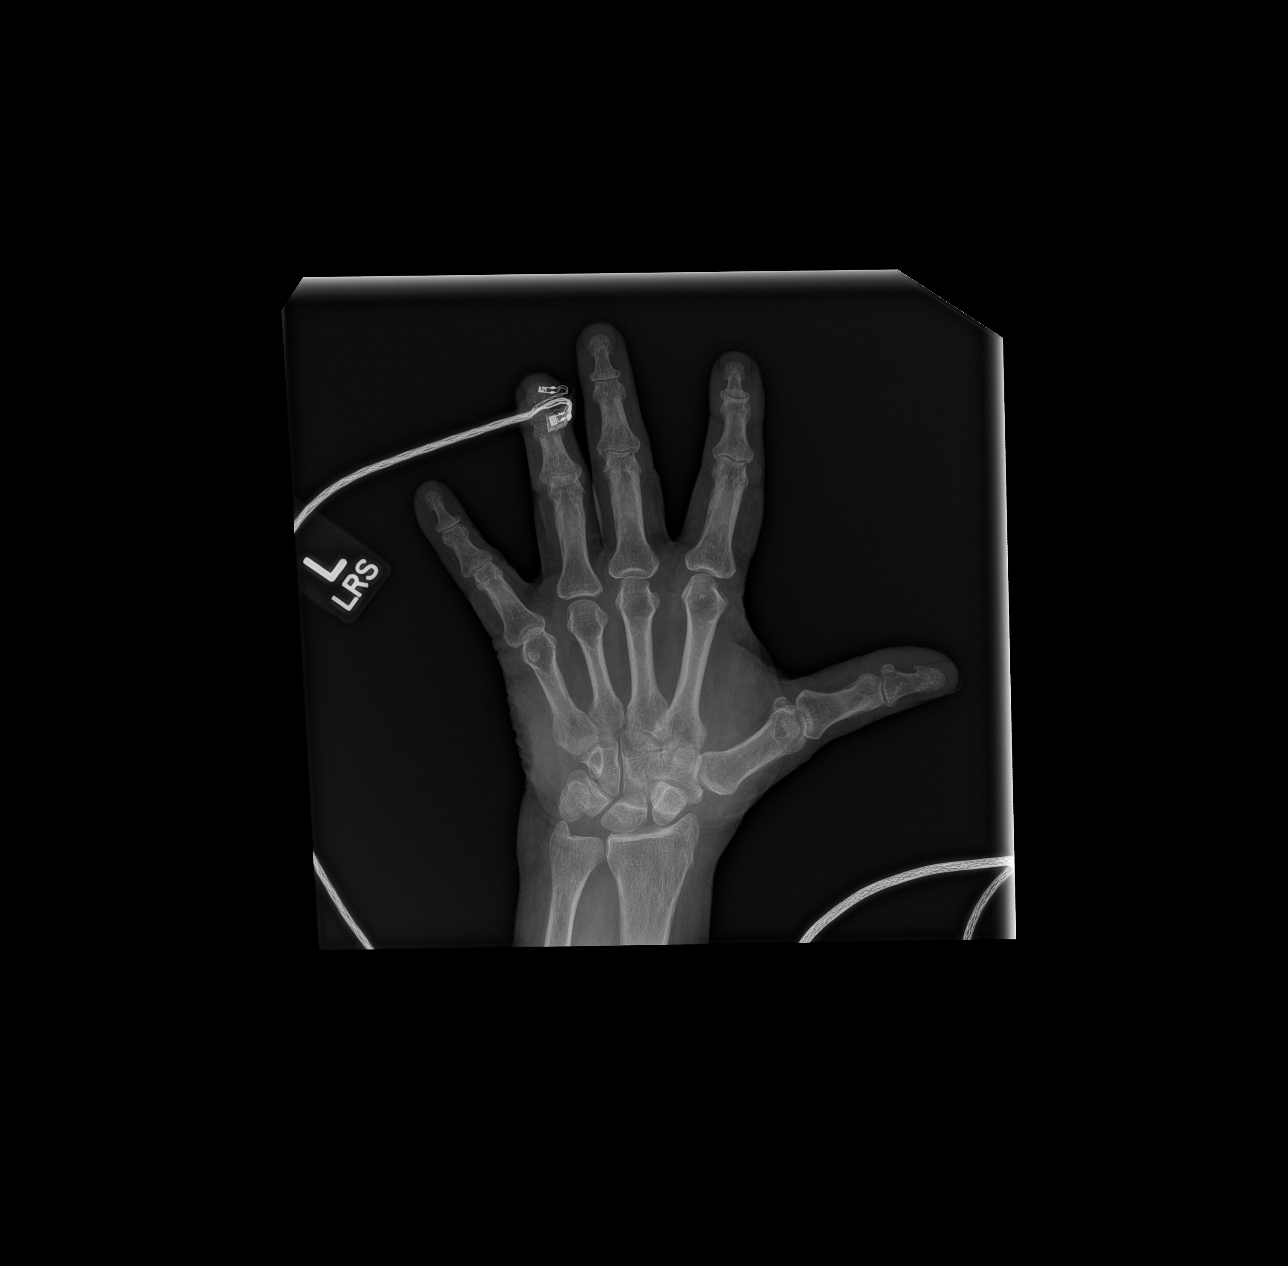

[x hand obl left]
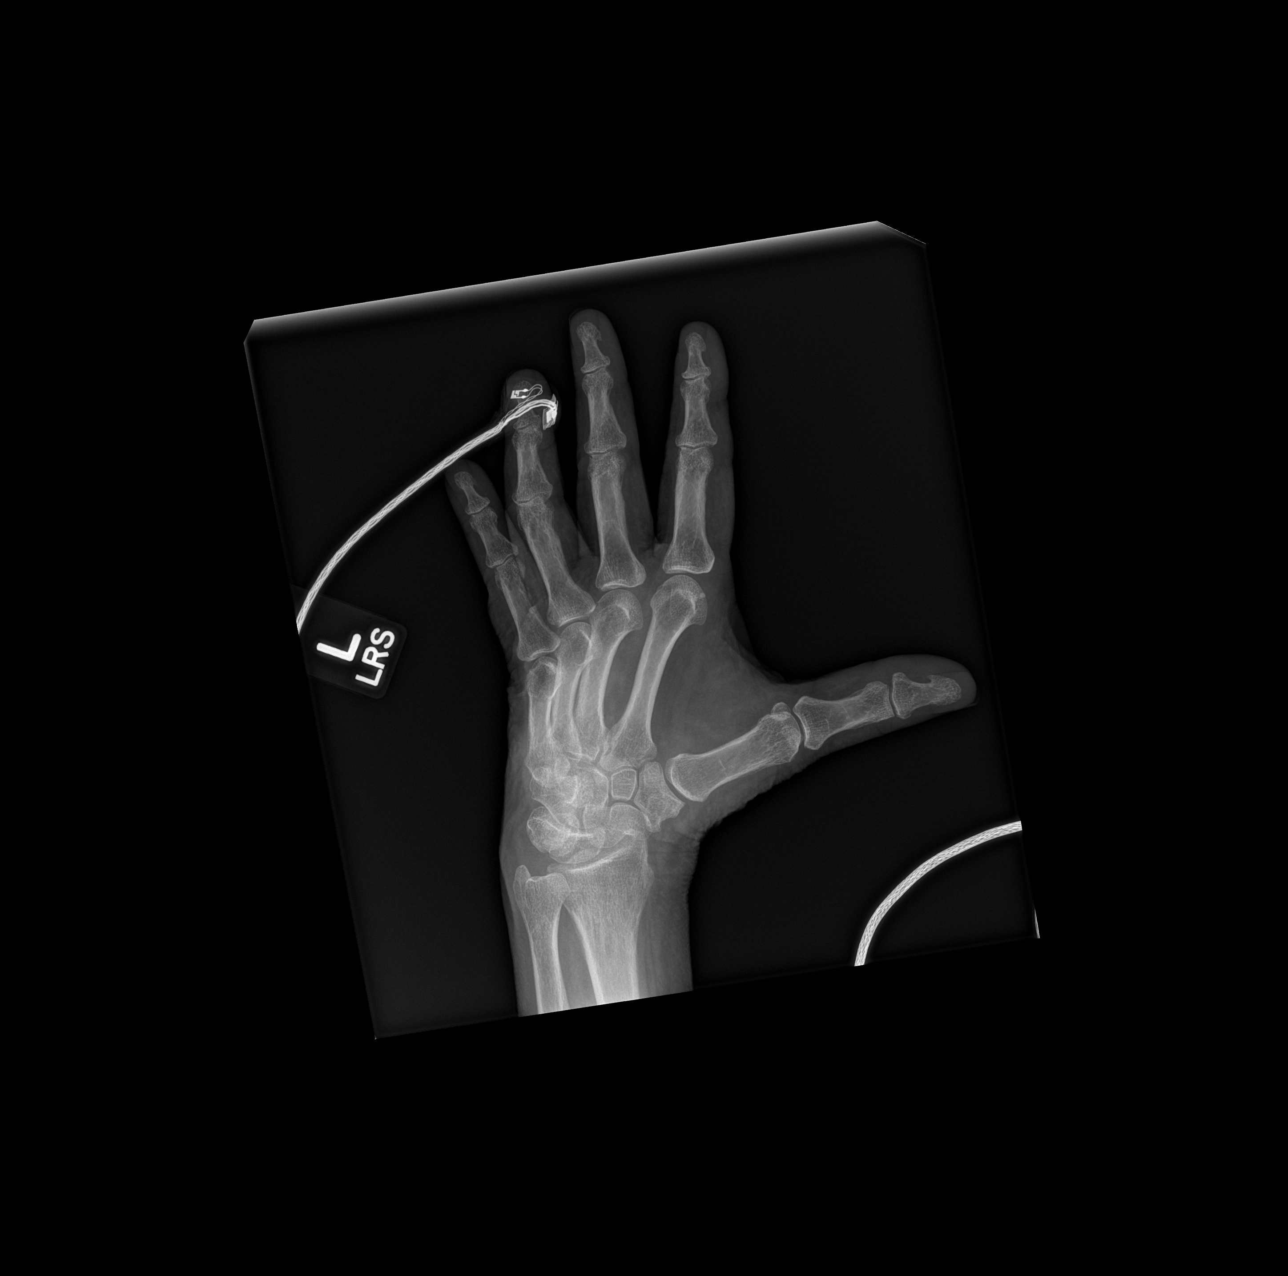

[x hand lat left]
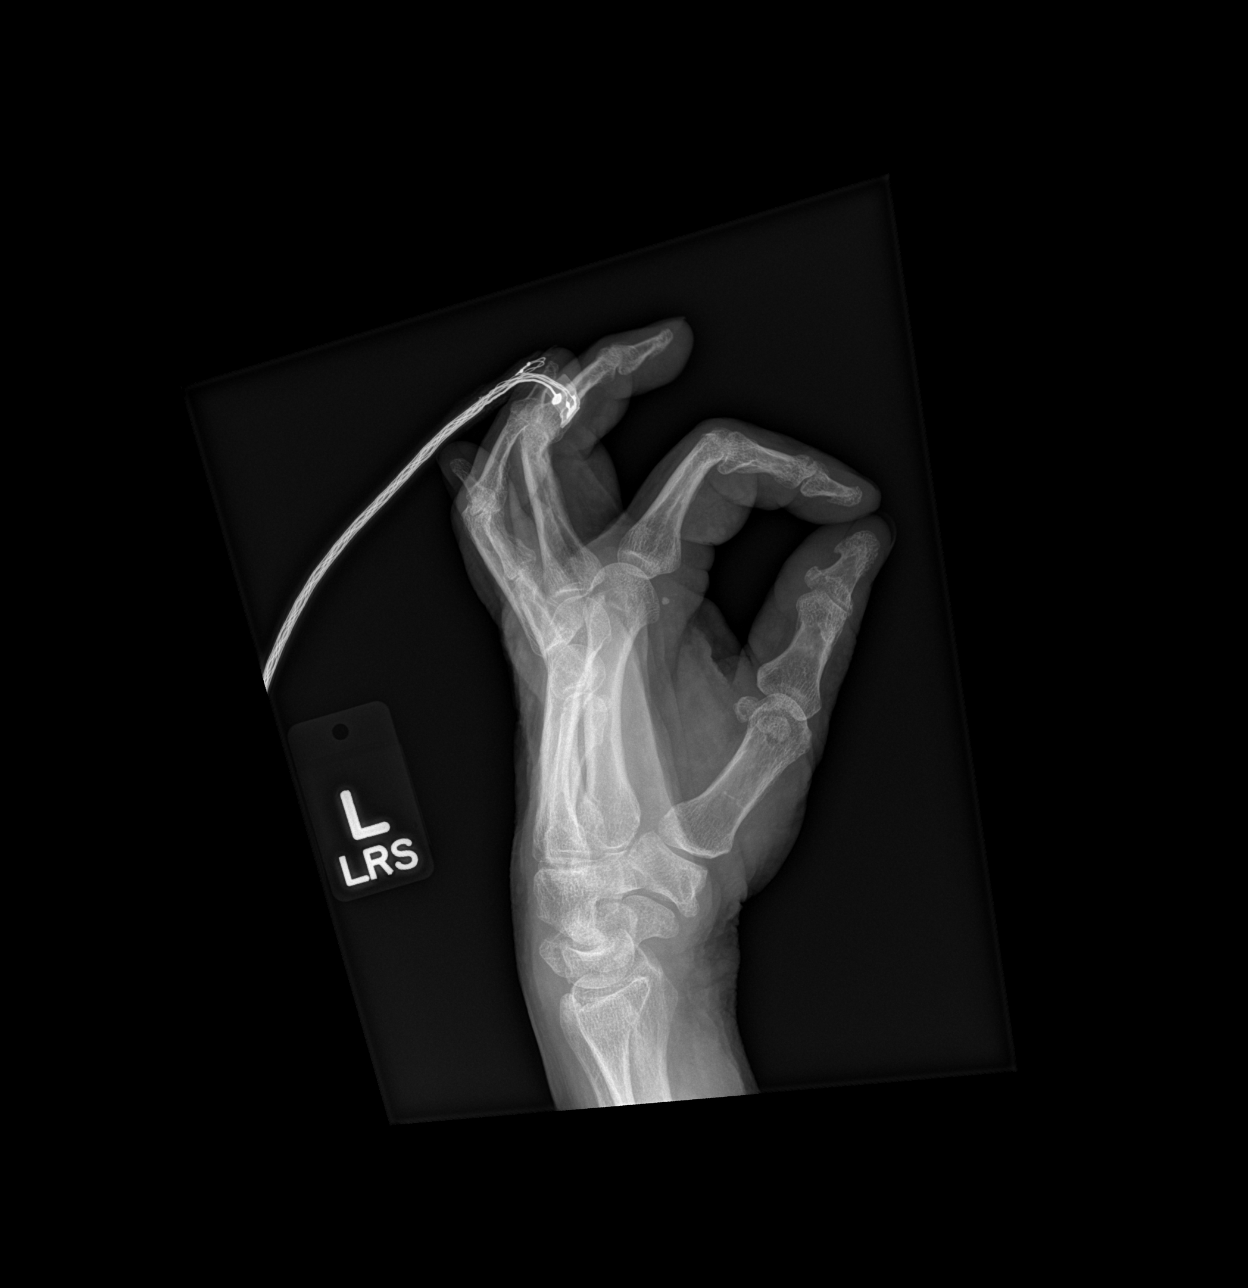

[3 of 3 positions shown; findings below may reference images not displayed]

FINDINGS: Subtle nondisplaced fracture of the distal shaft of the left fifth
metacarpal cannot be excluded. This is identified only on one view.
Lucency may just represent a soft tissue fold. No other acute bony
abnormalities identified. Diffuse degenerative change.
IMPRESSION: 1. Cannot exclude subtle nondisplaced fracture of the distal shaft
of the left fifth metacarpal. This is seen only on one view .
Lucency identified may just represent a soft tissue fold.
2. Diffuse degenerative change.

## 2016-09-03 IMAGING — CR DG HAND COMPLETE 3+V*R*
3 series · 3 of 3 positions shown · non-contrast
Comparison: None.

CLINICAL DATA: Bilateral hand pain. No known injury. Initial
evaluation.

EXAM:
RIGHT HAND - COMPLETE 3+ VIEW

[x hand pa right]
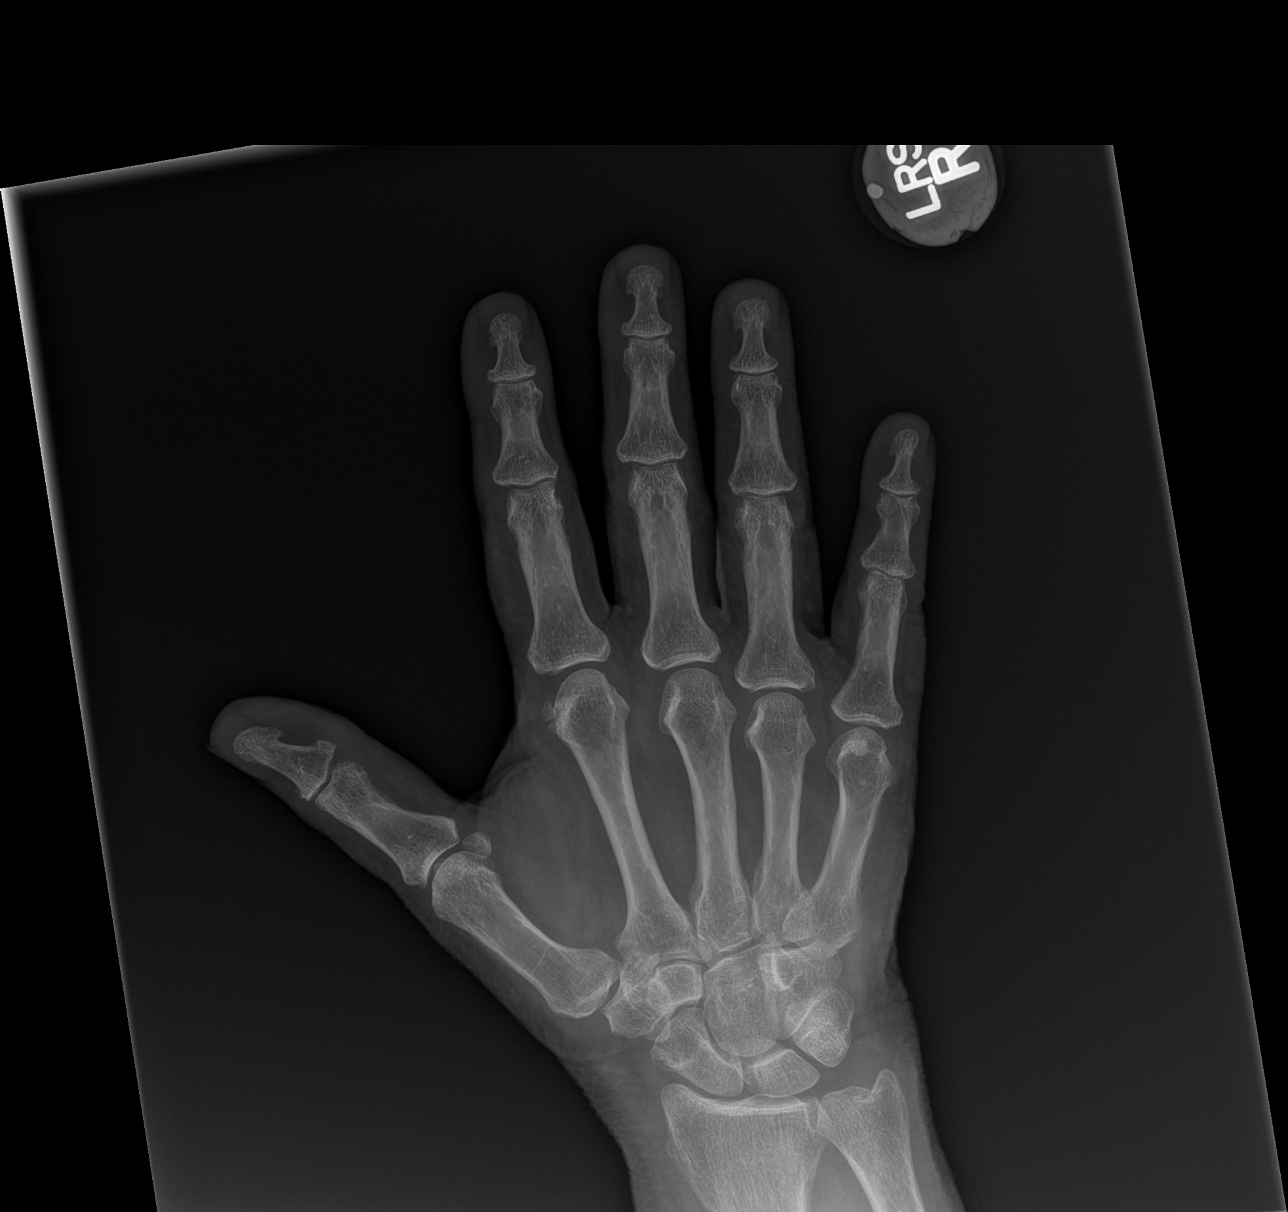

[x hand obl right]
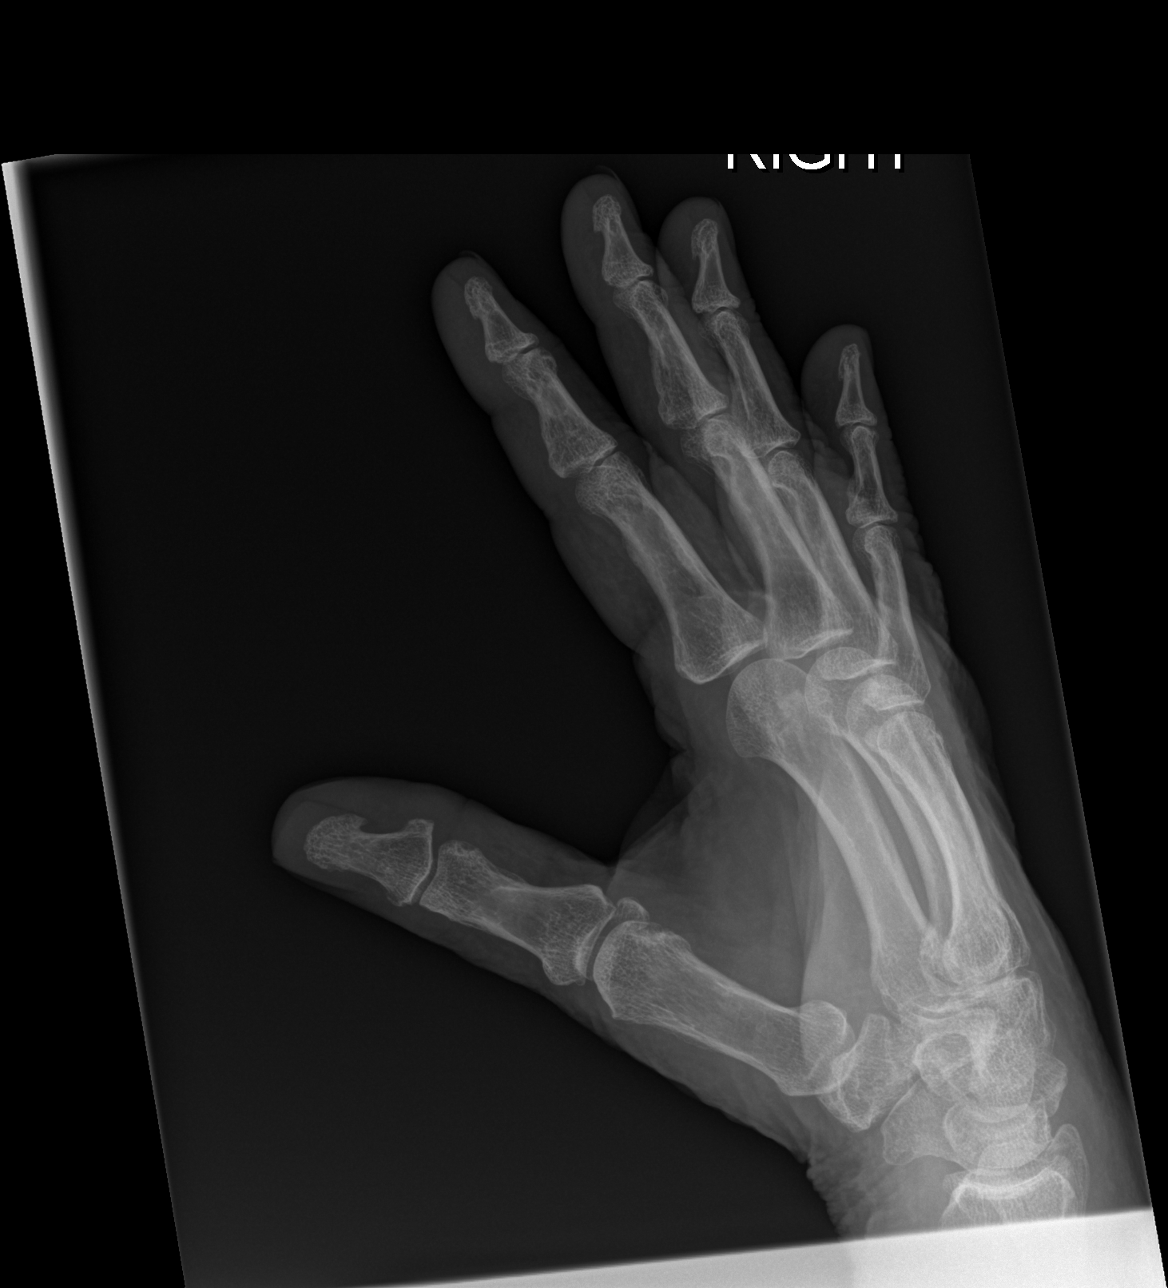

[x hand lat right]
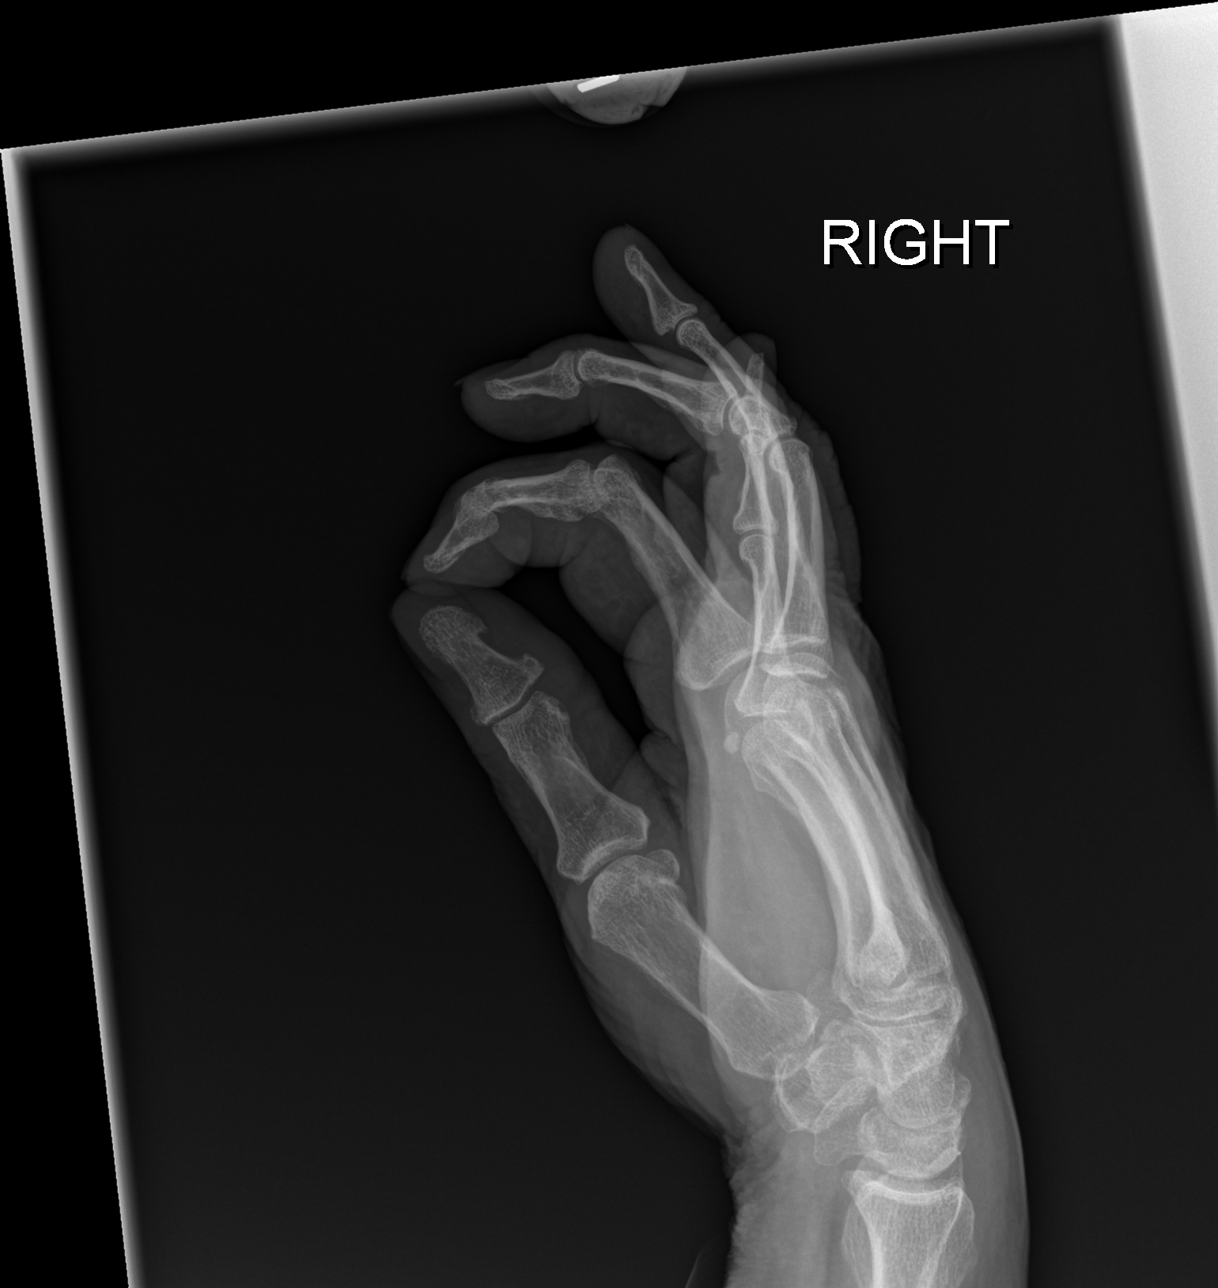

[3 of 3 positions shown; findings below may reference images not displayed]

FINDINGS: No acute soft tissue bony abnormality identified. Diffuse mild
degenerative change.
IMPRESSION: No acute bony abnormality.  Diffuse mild degenerative change.

## 2016-09-04 DIAGNOSIS — J449 Chronic obstructive pulmonary disease, unspecified: Secondary | ICD-10-CM | POA: Diagnosis not present

## 2016-09-04 DIAGNOSIS — J9600 Acute respiratory failure, unspecified whether with hypoxia or hypercapnia: Secondary | ICD-10-CM | POA: Diagnosis not present

## 2016-09-04 DIAGNOSIS — R0902 Hypoxemia: Secondary | ICD-10-CM | POA: Diagnosis not present

## 2016-09-09 DIAGNOSIS — J9620 Acute and chronic respiratory failure, unspecified whether with hypoxia or hypercapnia: Secondary | ICD-10-CM | POA: Diagnosis not present

## 2016-09-09 DIAGNOSIS — R0902 Hypoxemia: Secondary | ICD-10-CM | POA: Diagnosis not present

## 2016-09-09 DIAGNOSIS — J449 Chronic obstructive pulmonary disease, unspecified: Secondary | ICD-10-CM | POA: Diagnosis not present

## 2016-09-09 DIAGNOSIS — R0609 Other forms of dyspnea: Secondary | ICD-10-CM | POA: Diagnosis not present

## 2016-09-09 DIAGNOSIS — I509 Heart failure, unspecified: Secondary | ICD-10-CM | POA: Diagnosis not present

## 2016-09-09 DIAGNOSIS — R0602 Shortness of breath: Secondary | ICD-10-CM | POA: Diagnosis not present

## 2016-09-09 DIAGNOSIS — I5032 Chronic diastolic (congestive) heart failure: Secondary | ICD-10-CM | POA: Diagnosis not present

## 2016-09-20 ENCOUNTER — Inpatient Hospital Stay (HOSPITAL_COMMUNITY)
Admission: EM | Admit: 2016-09-20 | Discharge: 2016-09-24 | DRG: 186 | Disposition: A | Discharge status: Home Health | Payer: Medicare HMO | Attending: Internal Medicine | Admitting: Internal Medicine

## 2016-09-20 ENCOUNTER — Encounter (HOSPITAL_COMMUNITY): Payer: Self-pay | Admitting: Nurse Practitioner

## 2016-09-20 ENCOUNTER — Emergency Department (HOSPITAL_COMMUNITY): Payer: Medicare HMO

## 2016-09-20 ENCOUNTER — Telehealth: Payer: Self-pay | Admitting: Pharmacist

## 2016-09-20 DIAGNOSIS — R0602 Shortness of breath: Secondary | ICD-10-CM

## 2016-09-20 DIAGNOSIS — I2729 Other secondary pulmonary hypertension: Secondary | ICD-10-CM | POA: Diagnosis present

## 2016-09-20 DIAGNOSIS — J449 Chronic obstructive pulmonary disease, unspecified: Secondary | ICD-10-CM | POA: Diagnosis present

## 2016-09-20 DIAGNOSIS — E785 Hyperlipidemia, unspecified: Secondary | ICD-10-CM | POA: Diagnosis present

## 2016-09-20 DIAGNOSIS — Z85118 Personal history of other malignant neoplasm of bronchus and lung: Secondary | ICD-10-CM | POA: Diagnosis not present

## 2016-09-20 DIAGNOSIS — Z972 Presence of dental prosthetic device (complete) (partial): Secondary | ICD-10-CM

## 2016-09-20 DIAGNOSIS — I272 Pulmonary hypertension, unspecified: Secondary | ICD-10-CM | POA: Diagnosis not present

## 2016-09-20 DIAGNOSIS — N189 Chronic kidney disease, unspecified: Secondary | ICD-10-CM | POA: Diagnosis not present

## 2016-09-20 DIAGNOSIS — I2582 Chronic total occlusion of coronary artery: Secondary | ICD-10-CM | POA: Diagnosis not present

## 2016-09-20 DIAGNOSIS — K219 Gastro-esophageal reflux disease without esophagitis: Secondary | ICD-10-CM | POA: Diagnosis present

## 2016-09-20 DIAGNOSIS — G63 Polyneuropathy in diseases classified elsewhere: Secondary | ICD-10-CM

## 2016-09-20 DIAGNOSIS — I2510 Atherosclerotic heart disease of native coronary artery without angina pectoris: Secondary | ICD-10-CM | POA: Diagnosis present

## 2016-09-20 DIAGNOSIS — J9620 Acute and chronic respiratory failure, unspecified whether with hypoxia or hypercapnia: Secondary | ICD-10-CM | POA: Diagnosis not present

## 2016-09-20 DIAGNOSIS — Z9981 Dependence on supplemental oxygen: Secondary | ICD-10-CM | POA: Diagnosis not present

## 2016-09-20 DIAGNOSIS — Z6841 Body Mass Index (BMI) 40.0 and over, adult: Secondary | ICD-10-CM

## 2016-09-20 DIAGNOSIS — Z66 Do not resuscitate: Secondary | ICD-10-CM | POA: Diagnosis not present

## 2016-09-20 DIAGNOSIS — I110 Hypertensive heart disease with heart failure: Secondary | ICD-10-CM | POA: Diagnosis present

## 2016-09-20 DIAGNOSIS — Z955 Presence of coronary angioplasty implant and graft: Secondary | ICD-10-CM

## 2016-09-20 DIAGNOSIS — I700 Atherosclerosis of aorta: Secondary | ICD-10-CM

## 2016-09-20 DIAGNOSIS — R05 Cough: Secondary | ICD-10-CM | POA: Diagnosis not present

## 2016-09-20 DIAGNOSIS — Z8249 Family history of ischemic heart disease and other diseases of the circulatory system: Secondary | ICD-10-CM

## 2016-09-20 DIAGNOSIS — E119 Type 2 diabetes mellitus without complications: Secondary | ICD-10-CM | POA: Diagnosis not present

## 2016-09-20 DIAGNOSIS — R911 Solitary pulmonary nodule: Secondary | ICD-10-CM | POA: Diagnosis present

## 2016-09-20 DIAGNOSIS — I130 Hypertensive heart and chronic kidney disease with heart failure and stage 1 through stage 4 chronic kidney disease, or unspecified chronic kidney disease: Secondary | ICD-10-CM | POA: Diagnosis not present

## 2016-09-20 DIAGNOSIS — J90 Pleural effusion, not elsewhere classified: Secondary | ICD-10-CM

## 2016-09-20 DIAGNOSIS — Z888 Allergy status to other drugs, medicaments and biological substances status: Secondary | ICD-10-CM

## 2016-09-20 DIAGNOSIS — I2781 Cor pulmonale (chronic): Secondary | ICD-10-CM | POA: Diagnosis present

## 2016-09-20 DIAGNOSIS — I5033 Acute on chronic diastolic (congestive) heart failure: Secondary | ICD-10-CM | POA: Diagnosis present

## 2016-09-20 DIAGNOSIS — D649 Anemia, unspecified: Secondary | ICD-10-CM | POA: Diagnosis present

## 2016-09-20 DIAGNOSIS — I509 Heart failure, unspecified: Secondary | ICD-10-CM | POA: Diagnosis not present

## 2016-09-20 DIAGNOSIS — Z79899 Other long term (current) drug therapy: Secondary | ICD-10-CM

## 2016-09-20 DIAGNOSIS — Z7982 Long term (current) use of aspirin: Secondary | ICD-10-CM | POA: Diagnosis not present

## 2016-09-20 DIAGNOSIS — Z01818 Encounter for other preprocedural examination: Secondary | ICD-10-CM

## 2016-09-20 DIAGNOSIS — Z419 Encounter for procedure for purposes other than remedying health state, unspecified: Secondary | ICD-10-CM

## 2016-09-20 DIAGNOSIS — Z87891 Personal history of nicotine dependence: Secondary | ICD-10-CM

## 2016-09-20 DIAGNOSIS — E1151 Type 2 diabetes mellitus with diabetic peripheral angiopathy without gangrene: Secondary | ICD-10-CM

## 2016-09-20 DIAGNOSIS — I5032 Chronic diastolic (congestive) heart failure: Secondary | ICD-10-CM | POA: Diagnosis not present

## 2016-09-20 DIAGNOSIS — F329 Major depressive disorder, single episode, unspecified: Secondary | ICD-10-CM | POA: Diagnosis present

## 2016-09-20 DIAGNOSIS — E538 Deficiency of other specified B group vitamins: Secondary | ICD-10-CM

## 2016-09-20 DIAGNOSIS — Z923 Personal history of irradiation: Secondary | ICD-10-CM

## 2016-09-20 DIAGNOSIS — Z8614 Personal history of Methicillin resistant Staphylococcus aureus infection: Secondary | ICD-10-CM

## 2016-09-20 DIAGNOSIS — Z9221 Personal history of antineoplastic chemotherapy: Secondary | ICD-10-CM

## 2016-09-20 DIAGNOSIS — J9621 Acute and chronic respiratory failure with hypoxia: Secondary | ICD-10-CM

## 2016-09-20 DIAGNOSIS — Z90710 Acquired absence of both cervix and uterus: Secondary | ICD-10-CM

## 2016-09-20 DIAGNOSIS — Z9689 Presence of other specified functional implants: Secondary | ICD-10-CM

## 2016-09-20 DIAGNOSIS — Z90722 Acquired absence of ovaries, bilateral: Secondary | ICD-10-CM

## 2016-09-20 DIAGNOSIS — K5909 Other constipation: Secondary | ICD-10-CM | POA: Diagnosis present

## 2016-09-20 DIAGNOSIS — R918 Other nonspecific abnormal finding of lung field: Secondary | ICD-10-CM | POA: Diagnosis not present

## 2016-09-20 DIAGNOSIS — J441 Chronic obstructive pulmonary disease with (acute) exacerbation: Secondary | ICD-10-CM

## 2016-09-20 HISTORY — DX: Type 2 diabetes mellitus without complications: E11.9

## 2016-09-20 LAB — CBC WITH DIFFERENTIAL/PLATELET
Basophils Absolute: 0 10*3/uL (ref 0.0–0.1)
Basophils Relative: 0 %
EOS ABS: 0.4 10*3/uL (ref 0.0–0.7)
EOS PCT: 4 %
HCT: 32.8 % — ABNORMAL LOW (ref 36.0–46.0)
Hemoglobin: 9.5 g/dL — ABNORMAL LOW (ref 12.0–15.0)
LYMPHS ABS: 0.7 10*3/uL (ref 0.7–4.0)
LYMPHS PCT: 8 %
MCH: 28.1 pg (ref 26.0–34.0)
MCHC: 29 g/dL — ABNORMAL LOW (ref 30.0–36.0)
MCV: 97 fL (ref 78.0–100.0)
MONOS PCT: 13 %
Monocytes Absolute: 1.2 10*3/uL — ABNORMAL HIGH (ref 0.1–1.0)
Neutro Abs: 7.4 10*3/uL (ref 1.7–7.7)
Neutrophils Relative %: 75 %
PLATELETS: 239 10*3/uL (ref 150–400)
RBC: 3.38 MIL/uL — ABNORMAL LOW (ref 3.87–5.11)
RDW: 13.7 % (ref 11.5–15.5)
WBC: 9.8 10*3/uL (ref 4.0–10.5)

## 2016-09-20 LAB — URINALYSIS, ROUTINE W REFLEX MICROSCOPIC
BILIRUBIN URINE: NEGATIVE
Glucose, UA: NEGATIVE mg/dL
HGB URINE DIPSTICK: NEGATIVE
Ketones, ur: NEGATIVE mg/dL
Leukocytes, UA: NEGATIVE
NITRITE: NEGATIVE
PH: 5 (ref 5.0–8.0)
Protein, ur: NEGATIVE mg/dL
SPECIFIC GRAVITY, URINE: 1.009 (ref 1.005–1.030)

## 2016-09-20 LAB — BASIC METABOLIC PANEL
Anion gap: 8 (ref 5–15)
BUN: 12 mg/dL (ref 6–20)
CHLORIDE: 95 mmol/L — AB (ref 101–111)
CO2: 35 mmol/L — AB (ref 22–32)
CREATININE: 2.13 mg/dL — AB (ref 0.44–1.00)
Calcium: 8.6 mg/dL — ABNORMAL LOW (ref 8.9–10.3)
GFR calc Af Amer: 27 mL/min — ABNORMAL LOW (ref >60)
GFR calc non Af Amer: 24 mL/min — ABNORMAL LOW (ref >60)
GLUCOSE: 157 mg/dL — AB (ref 65–99)
Potassium: 4.5 mmol/L (ref 3.5–5.1)
Sodium: 138 mmol/L (ref 135–145)

## 2016-09-20 LAB — BRAIN NATRIURETIC PEPTIDE: B NATRIURETIC PEPTIDE 5: 112 pg/mL — AB (ref 0.0–100.0)

## 2016-09-20 LAB — I-STAT TROPONIN, ED: Troponin i, poc: 0 ng/mL (ref 0.00–0.08)

## 2016-09-20 LAB — GLUCOSE, CAPILLARY: GLUCOSE-CAPILLARY: 145 mg/dL — AB (ref 65–99)

## 2016-09-20 MED ORDER — ACETAMINOPHEN-CODEINE #3 300-30 MG PO TABS
1.0000 | ORAL_TABLET | Freq: Four times a day (QID) | ORAL | Status: DC | PRN
  Administered 2016-09-23 (×2): 2 via ORAL
  Filled 2016-09-20 (×2): qty 2

## 2016-09-20 MED ORDER — ASPIRIN 81 MG PO CHEW
81.0000 mg | CHEWABLE_TABLET | Freq: Every day | ORAL | Status: DC
  Administered 2016-09-21 – 2016-09-23 (×4): 81 mg via ORAL
  Filled 2016-09-20 (×4): qty 1

## 2016-09-20 MED ORDER — POLYETHYLENE GLYCOL 3350 17 G PO PACK: 17.0000 g | PACK | Freq: Every day | ORAL | Status: DC | PRN

## 2016-09-20 MED ORDER — HEPARIN SODIUM (PORCINE) 5000 UNIT/ML IJ SOLN
5000.0000 [IU] | Freq: Three times a day (TID) | INTRAMUSCULAR | Status: DC
  Administered 2016-09-21 – 2016-09-22 (×6): 5000 [IU] via SUBCUTANEOUS
  Filled 2016-09-20 (×4): qty 1

## 2016-09-20 MED ORDER — ACETAMINOPHEN 650 MG RE SUPP: 650.0000 mg | Freq: Four times a day (QID) | RECTAL | Status: DC | PRN

## 2016-09-20 MED ORDER — ACETAMINOPHEN 325 MG PO TABS
650.0000 mg | ORAL_TABLET | Freq: Four times a day (QID) | ORAL | Status: DC | PRN
  Administered 2016-09-22 – 2016-09-24 (×3): 650 mg via ORAL
  Filled 2016-09-20 (×3): qty 2

## 2016-09-20 MED ORDER — BUPROPION HCL ER (SR) 150 MG PO TB12
150.0000 mg | ORAL_TABLET | Freq: Two times a day (BID) | ORAL | Status: DC
  Administered 2016-09-21 – 2016-09-22 (×5): 150 mg via ORAL
  Filled 2016-09-20 (×6): qty 1

## 2016-09-20 MED ORDER — FLUTICASONE-UMECLIDIN-VILANT 100-62.5-25 MCG/INH IN AEPB: 1.0000 | INHALATION_SPRAY | Freq: Every day | RESPIRATORY_TRACT | Status: DC

## 2016-09-20 MED ORDER — FUROSEMIDE 10 MG/ML IJ SOLN
20.0000 mg | INTRAMUSCULAR | Status: AC
  Administered 2016-09-20: 20 mg via INTRAVENOUS
  Filled 2016-09-20: qty 2

## 2016-09-20 MED ORDER — FAMOTIDINE 20 MG PO TABS
10.0000 mg | ORAL_TABLET | Freq: Every day | ORAL | Status: DC
  Administered 2016-09-21 – 2016-09-22 (×2): 10 mg via ORAL
  Filled 2016-09-20 (×2): qty 1

## 2016-09-20 MED ORDER — SENNA 8.6 MG PO TABS
2.0000 | ORAL_TABLET | Freq: Every day | ORAL | Status: DC | PRN
  Administered 2016-09-22 – 2016-09-23 (×2): 17.2 mg via ORAL
  Filled 2016-09-20 (×2): qty 2

## 2016-09-20 MED ORDER — INSULIN ASPART 100 UNIT/ML ~~LOC~~ SOLN
0.0000 [IU] | Freq: Three times a day (TID) | SUBCUTANEOUS | Status: DC
  Administered 2016-09-21: 2 [IU] via SUBCUTANEOUS
  Administered 2016-09-22: 3 [IU] via SUBCUTANEOUS
  Administered 2016-09-22 – 2016-09-23 (×3): 2 [IU] via SUBCUTANEOUS
  Administered 2016-09-23: 9 [IU] via SUBCUTANEOUS
  Administered 2016-09-23: 2 [IU] via SUBCUTANEOUS
  Administered 2016-09-24 (×2): 3 [IU] via SUBCUTANEOUS

## 2016-09-20 MED ORDER — ALBUTEROL SULFATE (2.5 MG/3ML) 0.083% IN NEBU: 2.5000 mg | INHALATION_SOLUTION | Freq: Four times a day (QID) | RESPIRATORY_TRACT | Status: DC

## 2016-09-20 MED ORDER — IPRATROPIUM-ALBUTEROL 0.5-2.5 (3) MG/3ML IN SOLN: 3.0000 mL | Freq: Four times a day (QID) | RESPIRATORY_TRACT | Status: DC

## 2016-09-20 MED ORDER — FUROSEMIDE 10 MG/ML IJ SOLN
60.0000 mg | Freq: Once | INTRAMUSCULAR | Status: DC
  Filled 2016-09-20: qty 6

## 2016-09-20 MED ORDER — PANTOPRAZOLE SODIUM 40 MG PO TBEC
40.0000 mg | DELAYED_RELEASE_TABLET | Freq: Every day | ORAL | Status: DC
  Administered 2016-09-21 – 2016-09-22 (×2): 40 mg via ORAL
  Filled 2016-09-20 (×2): qty 1

## 2016-09-20 MED ORDER — IPRATROPIUM-ALBUTEROL 0.5-2.5 (3) MG/3ML IN SOLN
3.0000 mL | Freq: Once | RESPIRATORY_TRACT | Status: AC
  Administered 2016-09-20: 3 mL via RESPIRATORY_TRACT
  Filled 2016-09-20: qty 3

## 2016-09-20 MED ORDER — ROSUVASTATIN CALCIUM 20 MG PO TABS
20.0000 mg | ORAL_TABLET | Freq: Every day | ORAL | Status: DC
Start: 2016-09-21 — End: 2016-09-23
  Administered 2016-09-21 – 2016-09-22 (×2): 20 mg via ORAL
  Filled 2016-09-20 (×2): qty 1

## 2016-09-20 MED ORDER — GABAPENTIN 300 MG PO CAPS
600.0000 mg | ORAL_CAPSULE | Freq: Every day | ORAL | Status: DC
  Administered 2016-09-21 – 2016-09-23 (×4): 600 mg via ORAL
  Filled 2016-09-20 (×4): qty 2

## 2016-09-20 NOTE — ED Provider Notes (Signed)
[facility] DEPT Provider Note   CSN: [id number] Arrival date & time: 09/20/16  [place]     History   Chief Complaint Chief Complaint  Patient presents with  . Shortness of Breath    HPI [name] is a 61 y.o. female.  [name] is a 61 y.o. Female with a history of COPD, coronary artery disease, CHF, lung cancer, diabetes, diastolic dysfunction, moderate hypertension, and on home oxygen therapy who presents to the emergency department complaining of increasing shortness of breath and chills over the past 2 days. She reports her chronic cough that has not worsened or changed. She is on oxygen chronically at home. She had some mild chest tightness but denies any chest pain. She reports she has felt very short of breath when she ambulates and this morning she almost lost her balance while walking to the bathroom. She denies falling or hitting her head. She reports using her combination inhaler today without relief. She denies increased cough, hemoptysis, leg swelling, abdominal pain, nausea, vomiting, syncope, head injury or rashes.   The history is provided by the patient and medical records. No language interpreter was used.  Shortness of Breath  Associated symptoms include cough. Pertinent negatives include no fever, no headaches, no sore throat, no neck pain, no chest pain, no vomiting, no abdominal pain, no rash and no leg swelling.    Past Medical History:  Diagnosis Date  . Arthritis    "some scattered" (03/03/2015)  . CHF (congestive heart failure) ([place])   . Chronic bronchitis ([place])    "get it most years" (03/03/2015)  . COPD (chronic obstructive pulmonary disease) (HCC)    Severe. Gold Stage IV.  PFTs (12/2008) - severe obstructive airway disease. Active tobacco use. Requires 4L O2 at home.  . Coronary artery disease    S/P PCI of LAD with DES (12/2008). Total occlusion of RCA noted at that time., medically managed. ACS ruled out 03/2009 with Lexiscan myoview .  Followed by [facility].  . Depression   . Diabetes mellitus without complication ([place])   . Diastolic dysfunction    2-D Echo (12/2008) - Normal LV Systolic funciton with EF 60-65%. Grade 1 diastolid dysfunction. No regional wall motion abnormalities. Moderate pulmonary HTN with PA peak pressure 43mHg.  . Full dentures   . GERD (gastroesophageal reflux disease)    S/P Nissen fundoplication.  .Marland KitchenHistory of hiatal hernia   . Hx MRSA infection    Recurrent MRSA thigh abscesses.  . Hyperlipidemia   . Lung cancer ([facility]    "left"  . Obesity   . On home oxygen therapy since 2010   4L all the time  . Pneumonia   . Prediabetes    HgbA1c 6.4 (12/2008)  . Pulmonary hypertension ([place]    2-D Echo (054/6270 - Systolic pressure was moderately increased. PA peak pressure  566mg. 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 Followed by Dr. WrJoya GaskinsLVelora Heckler . Pulmonary nodule, right    Small right middle lobe nodule. Stable as of 12/2008.  . Marland Kitchenhortness of breath dyspnea    with a lot of exertion; if fluid builds up    Patient Active Problem List   Diagnosis Date Noted  . Acute on chronic respiratory failure ([facility]06/19/2018  . COPD exacerbation ([facility]05/22/2018  . Pleural effusion   . Constipation due to slow transit   . History of adenocarcinoma of  lung   . Vitamin D deficiency 02/15/2016  . Benign paroxysmal positional vertigo 09/10/2015  . Subjective tinnitus 09/10/2015  . Chronic venous insufficiency   . 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 Primary adenocarcinoma of left lung ([place]) 12/04/2014  . Healthcare maintenance 02/06/2013  . Chronic diastolic heart failure ([place]) 11/05/2012  . Hyperlipidemia   . Financial difficulties 06/15/2010  . Pulmonary hypertension ([place]) 01/05/2009  . Morbid obesity with BMI of 40.0-44.9, adult ([place]) 12/31/2008  . Sleep apnea 12/31/2008  .  Tobacco abuse 12/22/2008  . 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 Severe chronic obstructive pulmonary disease ([place]) 12/22/2008  . Gastroesophageal reflux disease 12/22/2008  . 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 ABDOMINAL HYSTERECTOMY  1980's   "still have my cervix"  . APPENDECTOMY    . BACK SURGERY    . BILATERAL OOPHORECTOMY  1979  . BREAST LUMPECTOMY Right 1990's    (biopsy negative)  . CORONARY ANGIOPLASTY WITH STENT PLACEMENT  2010   [place]; RCA  . HERNIA REPAIR    . IR THORACENTESIS ASP PLEURAL SPACE W/IMG GUIDE  07/18/2016  . IR THORACENTESIS ASP PLEURAL SPACE W/IMG GUIDE  08/24/2016  . KNEE ARTHROSCOPY Right ~ 2000  . [place] SURGERY  2001   "herniated discs; both by [facility], Dr. Lorin Mercy"  . NISSEN FUNDOPLICATION    . RIGHT HEART CATHETERIZATION N/A 11/09/2012   Procedure: RIGHT HEART CATH;  Surgeon: Larey Dresser, MD;  Location[facility] CATH LAB;  Service: Cardiovascular;  Laterality: N/A;  . TONSILLECTOMY    . TUBAL LIGATION  1979  . VIDEO BRONCHOSCOPY WITH ENDOBRONCHIAL ULTRASOUND N/A 12/15/2014   Procedure: VIDEO BRONCHOSCOPY WITH ENDOBRONCHIAL ULTRASOUND;  Surgeon: Ivin Poot, MD;  Location: [facility] OR;  Service: Thoracic;  Laterality: N/A;    OB History    No data available       Home Medications    Prior to Admission medications   Medication Sig Start Date End Date Taking? Authorizing Provider  acetaminophen-codeine (TYLENOL #3) 300-30 MG tablet Take 1-2 tablets by mouth every 6 (six) hours as needed for severe pain. 08/05/16  Yes Oval Linsey, MD  albuterol (VENTOLIN HFA) 108 (90 Base) MCG/ACT inhaler Inhale 1-2 puffs into the lungs every 6 (six) hours as needed for shortness of breath. 02/03/16  Yes Oval Linsey, MD  aspirin 81 MG chewable tablet Chew 1 tablet (81 mg total) by mouth at bedtime. 01/16/15  Yes Oval Linsey, MD  buPROPion [facility] SR) 150 MG 12 hr tablet Take 1 tablet (150 mg total) by mouth 2 (two) times daily. 06/02/16  Yes Oval Linsey, MD  Fluticasone-Umeclidin-Vilant (TRELEGY ELLIPTA) 100-62.5-25 MCG/INH AEPB Inhale 1 puff into the lungs daily. 08/24/16  Yes Molt, Bethany, DO  furosemide (LASIX) 40 MG tablet Take 1 tablet (40 mg total) by mouth daily. 08/25/16  Yes Molt, Bethany, DO  gabapentin (NEURONTIN) 300 MG capsule Take 2 capsules (600 mg total) by mouth at bedtime. 08/05/16  Yes Oval Linsey, MD  metolazone (ZAROXOLYN) 2.5 MG tablet TAKE 1 TABLET BY MOUTH AS NEEDED(FOR 5 LBS WEIGHT GAIN) 05/13/15  Yes [name] Friar, PA-C  pantoprazole (PROTONIX) 40 MG tablet Take 1 tablet (40 mg total) by mouth daily. 11/05/15  Yes Oval Linsey, MD  polyethylene glycol [facility] / GLYCOLAX) packet Take 17 g by mouth daily as needed for moderate constipation or severe  constipation. 08/24/16  Yes Molt, Bethany, DO  potassium chloride SA (K-DUR,KLOR-CON) 20 MEQ tablet Take 1 tablet (20 mEq total) by mouth daily. Patient taking differently: Take 20 mEq by mouth 2 (two) times daily.  11/30/15  Yes Oval Linsey, MD  ranitidine (ZANTAC) 300 MG tablet Take 1 tablet (300 mg total) by mouth at bedtime. 08/05/16  Yes Oval Linsey, MD  rosuvastatin (CRESTOR) 20 MG tablet Take 1 tablet (20 mg total) by mouth daily. Patient taking differently: Take 20 mg by mouth at bedtime.  09/10/15  Yes Oval Linsey, MD  senna (SENOKOT) 8.6 MG TABS tablet Take 2 tablets (17.2 mg total) by mouth daily as needed for mild constipation or moderate constipation. 08/24/16  Yes Molt, Bethany, DO  Vitamin D, Ergocalciferol, (DRISDOL) 50000 units CAPS capsule Take 1 capsule (50,000 Units total) by mouth every 7 (seven) days. 08/08/16  Yes Oval Linsey, MD  vitamin B-12 (CYANOCOBALAMIN) 1000 MCG tablet Take 1 tablet (1,000 mcg total) by mouth daily. Patient not taking: Reported on 09/20/2016 09/10/15   Oval Linsey, MD     Family History Family History  Problem Relation Age of Onset  . Heart disease Mother 55       Deceased from MI at 56yo  . Hypertension Mother   . Heart disease Father 70       Deceased of MI age 21yo  . Hypertension Father   . Hypertension Brother   . Lung cancer Unknown        Grandmother    Social History Social History  Substance Use Topics  . Smoking status: Former Smoker    Packs/day: 1.00    Years: 45.00    Types: Cigarettes    Start date: 04/04/1969    Quit date: 11/04/2015  . Smokeless tobacco: Never Used     Comment: 1 cig per day  . Alcohol use No     Allergies   Fluconazole and Atorvastatin   Review of Systems Review of Systems  Constitutional: Negative for chills and fever.  HENT: Negative for congestion and sore throat.   Eyes: Negative for visual disturbance.  Respiratory: Positive for cough, chest tightness and shortness of breath.   Cardiovascular: Negative for chest pain, palpitations and leg swelling.  Gastrointestinal: Negative for abdominal pain, diarrhea, nausea and vomiting.  Genitourinary: Positive for frequency. Negative for difficulty urinating and dysuria.  Musculoskeletal: Negative for back pain and neck pain.  Skin: Negative for rash.  Neurological: Positive for dizziness. Negative for syncope, weakness, light-headedness, numbness and headaches.     Physical Exam Updated Vital Signs BP (!) 148/65 (BP Location: Right Arm)   Pulse (!) 106   Temp 98.8 F (37.1 C) (Oral)   Resp 20   Ht _0  (1.651 m)   Wt 117.4 kg (258 lb 13.1 oz)   SpO2 90%   BMI 43.07 kg/m   Physical Exam  Constitutional: She is oriented to person, place, and time. She appears well-developed and well-nourished. No distress.  Nontoxic appearing. Obese female.  HENT:  Head: Normocephalic and atraumatic.  Mouth/Throat: Oropharynx is clear and moist.  Eyes: Conjunctivae are normal. Pupils are equal, round, and reactive to light. Right eye exhibits no  discharge. Left eye exhibits no discharge.  Neck: Neck supple. No JVD present.  Cardiovascular: Normal rate, regular rhythm, normal heart sounds and intact distal pulses.  Exam reveals no gallop and no friction rub.   No murmur heard. Heart rate 98 on exam. Bilateral radial, posterior tibialis and dorsalis pedis  pulses are intact.    Pulmonary/Chest: Effort normal. No stridor. No respiratory distress. She has no wheezes. She has no rales.  Diminished lung sounds to her bilateral bases. Absent lung sounds to her left base. No increased work of breathing. Patient on 4 L via nasal cannula during exam.  Abdominal: Soft. There is no tenderness.  Musculoskeletal: She exhibits no edema or tenderness.  No lower extremity edema or tenderness bilaterally.  Lymphadenopathy:    She has no cervical adenopathy.  Neurological: She is alert and oriented to person, place, and time. No cranial nerve deficit. Coordination normal.  Skin: Skin is warm and dry. Capillary refill takes less than 2 seconds. No rash noted. She is not diaphoretic. No erythema. No pallor.  Psychiatric: She has a normal mood and affect. Her behavior is normal.  Nursing note and vitals reviewed.    ED Treatments / Results  Labs (all labs ordered are listed, but only abnormal results are displayed) Labs Reviewed  BASIC METABOLIC PANEL - Abnormal; Notable for the following:       Result Value   Chloride 95 (*)    CO2 35 (*)    Glucose, Bld 157 (*)    Creatinine, Ser 2.13 (*)    Calcium 8.6 (*)    GFR calc non Af Amer 24 (*)    GFR calc Af Amer 27 (*)    All other components within normal limits  CBC WITH DIFFERENTIAL/PLATELET - Abnormal; Notable for the following:    RBC 3.38 (*)    Hemoglobin 9.5 (*)    HCT 32.8 (*)    MCHC 29.0 (*)    Monocytes Absolute 1.2 (*)    All other components within normal limits  BRAIN NATRIURETIC PEPTIDE - Abnormal; Notable for the following:    B Natriuretic Peptide 112.0 (*)    All other  components within normal limits  GLUCOSE, CAPILLARY - Abnormal; Notable for the following:    Glucose-Capillary 145 (*)    All other components within normal limits  URINALYSIS, ROUTINE W REFLEX MICROSCOPIC  BASIC METABOLIC PANEL  CBC  I-STAT TROPOININ, ED    EKG  EKG Interpretation  Date/Time:  Tuesday September 20 2016 18:50:52 EDT Ventricular Rate:  101 PR Interval:  126 QRS Duration: 84 QT Interval:  352 QTC Calculation: 456 R Axis:   81 Text Interpretation:  Sinus tachycardia with Premature atrial complexes Otherwise normal ECG No STEMI.  Confirmed by Nanda Quinton [id number]) on 09/20/2016 9:53:25 PM       Radiology Dg Chest Port 1 View  Result Date: 09/20/2016 CLINICAL DATA:  Acute onset of shortness of breath and productive cough. Initial encounter. EXAM: PORTABLE CHEST 1 VIEW COMPARISON:  Chest radiograph performed 08/24/2016 FINDINGS: A moderate left-sided pleural effusion is noted, increased from the prior study. Vascular congestion is seen. Increased interstitial markings raise concern for mild interstitial edema. There is no evidence of pneumothorax. The cardiomediastinal silhouette is enlarged. No acute osseous abnormalities are seen. IMPRESSION: Increased moderate left pleural effusion. Vascular congestion and cardiomegaly. Increased interstitial markings raise concern for pulmonary edema. Electronically Signed   By: Garald Balding M.D.   On: 09/20/2016 19:36    Procedures Procedures (including critical care time)  Medications Ordered in ED Medications  polyethylene glycol (MIRALAX / GLYCOLAX) packet 17 g (not administered)  senna (SENOKOT) tablet 17.2 mg (not administered)  gabapentin (NEURONTIN) capsule 600 mg (600 mg Oral Given 09/21/16 0010)  famotidine (PEPCID) tablet 10 mg (not administered)  acetaminophen-codeine (TYLENOL #3)  300-30 MG per tablet 1-2 tablet (not administered)  buPROPion (WELLBUTRIN SR) 12 hr tablet 150 mg (150 mg Oral Given 09/21/16 0011)   pantoprazole (PROTONIX) EC tablet 40 mg (not administered)  rosuvastatin (CRESTOR) tablet 20 mg (not administered)  aspirin chewable tablet 81 mg (81 mg Oral Given 09/21/16 0010)  heparin injection 5,000 Units (5,000 Units Subcutaneous Given 09/21/16 0012)  acetaminophen (TYLENOL) tablet 650 mg (not administered)    Or  acetaminophen (TYLENOL) suppository 650 mg (not administered)  insulin aspart (novoLOG) injection 0-9 Units (not administered)  furosemide (LASIX) injection 60 mg (60 mg Intravenous Not Given 09/21/16 0012)  fluticasone furoate-vilanterol (BREO ELLIPTA) 100-25 MCG/INH 1 puff (not administered)    Or  umeclidinium bromide (INCRUSE ELLIPTA) 62.5 MCG/INH 1 puff (not administered)  MEDLINE mouth rinse (15 mLs Mouth Rinse Not Given 09/21/16 0015)  ipratropium-albuterol (DUONEB) 0.5-2.5 (3) MG/3ML nebulizer solution 3 mL (not administered)  albuterol (PROVENTIL) (2.5 MG/3ML) 0.083% nebulizer solution 2.5 mg (not administered)  ipratropium-albuterol (DUONEB) 0.5-2.5 (3) MG/3ML nebulizer solution 3 mL (3 mLs Nebulization Given 09/20/16 2009)  furosemide (LASIX) injection 20 mg (20 mg Intravenous Given 09/20/16 2201)     Initial Impression / Assessment and Plan / ED Course  I have reviewed the triage vital signs and the nursing notes.  Pertinent labs & imaging results that were available during my care of the patient were reviewed by me and considered in my medical decision making (see chart for details).    This is a 61 y.o. Female with a history of COPD, coronary artery disease, CHF, lung cancer, diabetes, diastolic dysfunction, moderate hypertension, and on home oxygen therapy who presents to the emergency department complaining of increasing shortness of breath and chills over the past 2 days. She reports her chronic cough that has not worsened or changed. She is on oxygen chronically at home. She had some mild chest tightness but denies any chest pain. She reports she has felt very  short of breath when she ambulates and this morning she almost lost her balance while walking to the bathroom. She denies falling or hitting her head. She reports using her combination inhaler today without relief. On exam the patient is afebrile and nontoxic appearing. She has some slight tachypnea with a respiratory rate of around 22. She is speaking in complete sentences. Diminished lung sounds bilaterally which is worse on her left side. She is overweight. Troponin is not elevated. Urinalysis is without sign of infection. BMP shows a kidney function at her baseline. CBC shows no leukocytosis and hemoglobin at her baseline. BNP is mildly elevated at 112. No history of recent elevations of BNP. Chest x-ray shows an increased moderate left pleural effusion. Vascular congestion and cardiomegaly. Increased interstitial markings raise concern for pulmonary edema. Question if the patient's increased trouble breathing is related to CHF exacerbation or this increased moderate left pleural effusion. Will plan for lasix 20 mg iv and admission. Patient agrees with plan for admission. She reports no improvement at reevaluation following duoneb.  I consulted with internal medicine teaching service who accepted the patient for admission.   This patient was discussed with Dr. Laverta Baltimore who agrees with assessment and plan.  Final Clinical Impressions(s) / ED Diagnoses   Final diagnoses:  Pleural effusion  Shortness of breath  Acute on chronic diastolic congestive heart failure [facility])    New Prescriptions Current Discharge Medication List       Waynetta Pean, Hershal Coria 09/21/16 0114    Margette Fast, MD 09/21/16 [phone]

## 2016-09-20 NOTE — H&P (Signed)
Date: 09/20/2016               Patient Name:  [name] MRN: [id number]  DOB: 27-Aug-1955 Age / Sex: 61 y.o., female   PCP: Morgan Linsey, MD         Medical Service: Internal Medicine Teaching Service         Attending Physician: Dr. Annia Belt, MD    First Contact: Dr. Heber Cottonwood Pager: [phone]  Second Contact: Dr. Posey Pronto Pager: [phone]       After Hours (After 5p/  First Contact Pager: [phone]  weekends / holidays): Second Contact Pager: [phone]   Chief Complaint: Shortness of breath  History of Present Illness: [name] is a 61 year old female with a past medical history significant for lung adenocarcinoma (s/p curative RT to LLL in 02/2015) with recurrent left sided pleural effusions, COPD on 4L home oxygen, pulmonary HTN, HFpEF, CAD, diabetes mellitus type 2, HTN presenting to [facility] ED with complaints of  shortness of breath. She reports that over the past 2 days she has had progressive worsening of shortness of breath and dsypnea with exertion. She says she can normally ambulate around her home without difficulties but now has not been able to walk but a few feet without having to stop. She denies any change in her chronic cough, no sputum production, wheezing, fevers. Does note chills at home. She says she has used her albuterol inhaler x 10 times today without any relief. Reports little improvement in the ED with nebulizer. No sinus congestion, sore throat, chest pain, abdominal pain, lower extremity edema. She reports her weight has been stable at home.   In the ED she was afebrile, normotensive with no tachypnea but was tachycardic around 100. Maintaining O2 saturations in the 90s on 5L [place]. However, she did desaturate while moving for examination and had difficulty catching her breath. Labs were notable for BNP of 112 (up from 86 a month ago) but otherwise unremarkable. Her CXR did show an increased moderate left sided pleural effusion with vascular congestion and  cardiomegaly.   Meds:  Current Meds  Medication Sig  . acetaminophen-codeine (TYLENOL #3) 300-30 MG tablet Take 1-2 tablets by mouth every 6 (six) hours as needed for severe pain.  Marland Kitchen albuterol (VENTOLIN HFA) 108 (90 Base) MCG/ACT inhaler Inhale 1-2 puffs into the lungs every 6 (six) hours as needed for shortness of breath.  Marland Kitchen aspirin 81 MG chewable tablet Chew 1 tablet (81 mg total) by mouth at bedtime.  Marland Kitchen buPROPion (WELLBUTRIN SR) 150 MG 12 hr tablet Take 1 tablet (150 mg total) by mouth 2 (two) times daily.  . Fluticasone-Umeclidin-Vilant (TRELEGY ELLIPTA) 100-62.5-25 MCG/INH AEPB Inhale 1 puff into the lungs daily.  . furosemide (LASIX) 40 MG tablet Take 1 tablet (40 mg total) by mouth daily.  Marland Kitchen gabapentin (NEURONTIN) 300 MG capsule Take 2 capsules (600 mg total) by mouth at bedtime.  . metolazone (ZAROXOLYN) 2.5 MG tablet TAKE 1 TABLET BY MOUTH AS NEEDED(FOR 5 LBS WEIGHT GAIN)  . pantoprazole (PROTONIX) 40 MG tablet Take 1 tablet (40 mg total) by mouth daily.  . polyethylene glycol (MIRALAX / GLYCOLAX) packet Take 17 g by mouth daily as needed for moderate constipation or severe constipation.  . potassium chloride SA (K-DUR,KLOR-CON) 20 MEQ tablet Take 1 tablet (20 mEq total) by mouth daily. (Patient taking differently: Take 20 mEq by mouth 2 (two) times daily. )  . ranitidine (ZANTAC) 300 MG tablet Take 1  tablet (300 mg total) by mouth at bedtime.  . rosuvastatin (CRESTOR) 20 MG tablet Take 1 tablet (20 mg total) by mouth daily. (Patient taking differently: Take 20 mg by mouth at bedtime. )  . senna (SENOKOT) 8.6 MG TABS tablet Take 2 tablets (17.2 mg total) by mouth daily as needed for mild constipation or moderate constipation.  . Vitamin D, Ergocalciferol, (DRISDOL) 50000 units CAPS capsule Take 1 capsule (50,000 Units total) by mouth every 7 (seven) days.     Allergies: Allergies as of 09/20/2016 - Review Complete 09/20/2016  Allergen Reaction Noted  . Fluconazole Anaphylaxis and  Itching   . Atorvastatin Other (See Comments) 09/09/2014   Past Medical History:  Diagnosis Date  . Arthritis    "some scattered" (03/03/2015)  . CHF (congestive heart failure) ([place])   . Chronic bronchitis ([place])    "get it most years" (03/03/2015)  . COPD (chronic obstructive pulmonary disease) (HCC)    Severe. Gold Stage IV.  PFTs (12/2008) - severe obstructive airway disease. Active tobacco use. Requires 4L O2 at home.  . Coronary artery disease    S/P PCI of LAD with DES (12/2008). Total occlusion of RCA noted at that time., medically managed. ACS ruled out 03/2009 with Lexiscan myoview . Followed by [facility].  . Depression   . Diabetes mellitus without complication ([place])   . Diastolic dysfunction    2-D Echo (12/2008) - Normal LV Systolic funciton with EF 60-65%. Grade 1 diastolid dysfunction. No regional wall motion abnormalities. Moderate pulmonary HTN with PA peak pressure 24mHg.  . Full dentures   . GERD (gastroesophageal reflux disease)    S/P Nissen fundoplication.  .Marland KitchenHistory of hiatal hernia   . Hx MRSA infection    Recurrent MRSA thigh abscesses.  . Hyperlipidemia   . [place]    "[place]"  . Obesity   . On home oxygen therapy since 2010   4L all the time  . Pneumonia   . Prediabetes    HgbA1c 6.4 (12/2008)  . [place]monary hypertension [place]    2-D Echo (036/6440 - Systolic pressure was moderately increased. PA peak pressure  564mg. 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 Followed by Dr. WrJoya GaskinsLVelora Heckler . Pulmonary nodule, right    Small right middle lobe nodule. Stable as of 12/2008.  . Marland Kitchenhortness of breath dyspnea    with a lot of exertion; if fluid builds up    Family History: Family History  Problem Relation Age of Onset  . Heart disease Mother [id number]     Deceased from MI at 471yo. Hypertension Mother   . Heart disease Father 544     Deceased  of MI age 62yo. Hypertension Father   . Hypertension Brother   . Lung cancer Unknown        Grandmother   Social History:  Social History   Social History  . Marital status: Divorced    Spouse name: N/A  . Number of children: N/A  . Years of education: N/A   Social History Main Topics  . Smoking status: Former Smoker    Packs/day: 1.00    Years: 45.00    Types: Cigarettes    Start date: 04/04/1969    Quit date: 11/04/2015  . Smokeless tobacco: Never Used     Comment: 1 cig per day  . Alcohol use No  .  Drug use: No  . Sexual activity: Not Currently    Birth control/ protection: None   Other Topics Concern  . None   Social History Narrative   Formerly worked as a Scientist, water quality, now disabled.   Divorced.   2 grown children.   Lives with her grandson.    Review of Systems: A complete ROS was negative except as per HPI.  Physical Exam: Blood pressure (!) 148/65, pulse (!) 106, temperature 98.8 F (37.1 C), temperature source Oral, resp. rate 20, height _0  (1.651 m), weight 258 lb 13.1 oz (117.4 kg), SpO2 90 %. Physical Exam  Constitutional: She is oriented to person, place, and time.  Obese female lying in bed in no acute distress, able to speak in full sentences  HENT:  Head: Normocephalic and atraumatic.  Mouth/Throat: Oropharynx is clear and moist. No oropharyngeal exudate.  Eyes: EOM are normal. Pupils are equal, round, and reactive to light.  Neck: No JVD present.  Cardiovascular: Regular rhythm and intact distal pulses.  Tachycardia present.  Exam reveals no gallop and no friction rub.   No murmur heard. Pulmonary/Chest: No accessory muscle usage. No tachypnea. No respiratory distress. She has decreased breath sounds in the left lower field. She has no wheezes. She has no rhonchi. She has no rales.  Abdominal: Soft. Bowel sounds are normal. She exhibits no distension. There is no tenderness.  Musculoskeletal: She exhibits no edema.  Neurological: She is alert and  oriented to person, place, and time.  Skin: Skin is warm and dry.  Psychiatric: Mood and affect normal.     EKG: Sinus tachycardia, PACs  CXR: Increased moderate left sided pleural effusion with vascular congestion and cardiomegaly.   Assessment & Plan by Problem:  1. Shortness of breath secondary to recurrent left-sided pleural effusion: She has a history of left lower lobe adenocarcinoma s/p treatment with radiation therapy. She has had admissions on 07/14/2016, 07/18/2016, and 08/23/2016 with similar presentations with left sided pleural effusions requiring thoracentesis. Cytology at last admission showed no malignant cells.  She again presents with worsening shortness of breath and recurrence and enlargement of her left sided pleural effusion on CXR as well as decreased breath sounds over her left lung base. She is on 4L home oxygen and has been requiring 5L [place] in the ED. She has not seen any improvement with nebulizer treatment and has no wheezing on exam (nor complaints of wheezing), cough, sputum production, fevers to suggest a COPD exacerbation. She does not appear to be grossly volume overloaded on exam with no JVD (though limited by her body habitus), lower extremity edema, or crackles on exam. Her BNP is mildly elevated today at 112 and has been steadily rising over the past several admissions. Her weight today is actually improved from previous admissions, 117 kg today compared to 119 kg at time of discharge from May admission. May be some component of volume overload but appears that her primary issue is from her pleural effusion.  - NPO at [facility], will arrange for thoracentesis in am (reports she needs to have it done by IR as they have been unsuccessful on the floor in the past) -Lasix IV 80 mg x 1 dose tonight -Strict I&O, daily weights -BMET in am -Given her recurrent effusions, may benefit from pleurodesis or Pleurx catheter placement   COPD: She is currently on Trelegy (fluticasone,  umeclidinium, vilanterol) and albuteorl prn at home. Uses 4L home oxygen. No wheezing on exam today. Low suspicion for COPD exacerbation  currently and suspect her worsening SOB is secondary to her pleural effusion. Will not start steroids or antibiotics currently. -Continue home meds -Duoneb q6hr while awake   HFpEF: Weight is improved from prior. No signs of volume overload on exam. BNP is mildly elevated at 112 today. -Lasix 80 mg IV x 1 dose -BMET in am  CAD: -ASA 81 mg daily - Rosuvastatin 20 mg daily  Chronic Constipation: -Senna prn -Miralax prn -Colace  DM type 2: Not on any medications at home. Last A1c 6.5.  -SSI  DVT PPx: Heparin SQ  Code Status: DNR/DNI  Dispo: Admit patient to Inpatient with expected length of stay greater than 2 midnights.  Signed: Maryellen Pile, MD 09/20/2016, 11:50 PM  Pager: [phone]

## 2016-09-20 NOTE — Progress Notes (Signed)
ER RN called and gave report. Room ready.

## 2016-09-20 NOTE — Progress Notes (Addendum)
[name] is a 61 y.o. female who was contacted for follow up on COPD medication management.   COPD medications: trelegy (fluticasone, umeclidinium, vilanterol), albuterol PRN  Patient correctly states how she takes the Trelegy, reports requiring albuterol up to 10 times daily for shortness of breath. She states she does have ipratropium nebulizer solution at home and is wondering if she can take. Will communicate with PCP. Advised patient to be seen in [facility] for symptom evaluation. Patient verbalized understanding, appointment scheduled for 09/26/16. Advised patient to seek medical attention if symptoms persist/worsen.  Flossie Dibble

## 2016-09-20 NOTE — ED Triage Notes (Signed)
Per EMS pt from home called out for shortness of breath. Patient has hx of COPD & CHF but notes increased shob over the past 2 days. Patient took albuterol inhaler 10 puffs throughout the day and was on her 4L of O2 that she wears constantly. EMS states breath sounds clear bilaterally, throughout. Pt pursed lip breathing. Endorses exertional shob and dizziness- sts caused her to fall into wall and hurt left side. Pt has hx of lung cancer on left side- in remission. Patient had fever of 101.0 tympanic. Pt had hospitalization mid-may for pleural effusion on the left.

## 2016-09-21 DIAGNOSIS — I110 Hypertensive heart disease with heart failure: Secondary | ICD-10-CM

## 2016-09-21 DIAGNOSIS — Z8249 Family history of ischemic heart disease and other diseases of the circulatory system: Secondary | ICD-10-CM

## 2016-09-21 DIAGNOSIS — I272 Pulmonary hypertension, unspecified: Secondary | ICD-10-CM

## 2016-09-21 DIAGNOSIS — I2510 Atherosclerotic heart disease of native coronary artery without angina pectoris: Secondary | ICD-10-CM

## 2016-09-21 DIAGNOSIS — Z9981 Dependence on supplemental oxygen: Secondary | ICD-10-CM

## 2016-09-21 DIAGNOSIS — Z87891 Personal history of nicotine dependence: Secondary | ICD-10-CM

## 2016-09-21 DIAGNOSIS — I5032 Chronic diastolic (congestive) heart failure: Secondary | ICD-10-CM

## 2016-09-21 DIAGNOSIS — R918 Other nonspecific abnormal finding of lung field: Secondary | ICD-10-CM

## 2016-09-21 DIAGNOSIS — Z888 Allergy status to other drugs, medicaments and biological substances status: Secondary | ICD-10-CM

## 2016-09-21 DIAGNOSIS — J449 Chronic obstructive pulmonary disease, unspecified: Secondary | ICD-10-CM

## 2016-09-21 DIAGNOSIS — Z66 Do not resuscitate: Secondary | ICD-10-CM

## 2016-09-21 DIAGNOSIS — E119 Type 2 diabetes mellitus without complications: Secondary | ICD-10-CM

## 2016-09-21 DIAGNOSIS — J90 Pleural effusion, not elsewhere classified: Principal | ICD-10-CM

## 2016-09-21 DIAGNOSIS — Z801 Family history of malignant neoplasm of trachea, bronchus and lung: Secondary | ICD-10-CM

## 2016-09-21 DIAGNOSIS — Z7982 Long term (current) use of aspirin: Secondary | ICD-10-CM

## 2016-09-21 DIAGNOSIS — K5909 Other constipation: Secondary | ICD-10-CM

## 2016-09-21 DIAGNOSIS — Z79899 Other long term (current) drug therapy: Secondary | ICD-10-CM

## 2016-09-21 DIAGNOSIS — Z85118 Personal history of other malignant neoplasm of bronchus and lung: Secondary | ICD-10-CM

## 2016-09-21 LAB — CBC
HCT: 32.8 % — ABNORMAL LOW (ref 36.0–46.0)
Hemoglobin: 9.5 g/dL — ABNORMAL LOW (ref 12.0–15.0)
MCH: 28.2 pg (ref 26.0–34.0)
MCHC: 29 g/dL — ABNORMAL LOW (ref 30.0–36.0)
MCV: 97.3 fL (ref 78.0–100.0)
Platelets: 230 10*3/uL (ref 150–400)
RBC: 3.37 MIL/uL — AB (ref 3.87–5.11)
RDW: 13.8 % (ref 11.5–15.5)
WBC: 9.9 10*3/uL (ref 4.0–10.5)

## 2016-09-21 LAB — GLUCOSE, CAPILLARY
GLUCOSE-CAPILLARY: 157 mg/dL — AB (ref 65–99)
GLUCOSE-CAPILLARY: 184 mg/dL — AB (ref 65–99)
Glucose-Capillary: 196 mg/dL — ABNORMAL HIGH (ref 65–99)
Glucose-Capillary: 167 mg/dL — ABNORMAL HIGH (ref 65–99)
Glucose-Capillary: 239 mg/dL — ABNORMAL HIGH (ref 65–99)

## 2016-09-21 LAB — BASIC METABOLIC PANEL
ANION GAP: 10 (ref 5–15)
BUN: 12 mg/dL (ref 6–20)
CO2: 35 mmol/L — ABNORMAL HIGH (ref 22–32)
Calcium: 8.6 mg/dL — ABNORMAL LOW (ref 8.9–10.3)
Chloride: 94 mmol/L — ABNORMAL LOW (ref 101–111)
Creatinine, Ser: 2.2 mg/dL — ABNORMAL HIGH (ref 0.44–1.00)
GFR, EST AFRICAN AMERICAN: 26 mL/min — AB (ref >60)
GFR, EST NON AFRICAN AMERICAN: 23 mL/min — AB (ref >60)
Glucose, Bld: 157 mg/dL — ABNORMAL HIGH (ref 65–99)
POTASSIUM: 4 mmol/L (ref 3.5–5.1)
SODIUM: 139 mmol/L (ref 135–145)

## 2016-09-21 MED ORDER — ALBUTEROL SULFATE (2.5 MG/3ML) 0.083% IN NEBU: 2.5000 mg | INHALATION_SOLUTION | RESPIRATORY_TRACT | Status: DC | PRN

## 2016-09-21 MED ORDER — FLUTICASONE FUROATE-VILANTEROL 100-25 MCG/INH IN AEPB
1.0000 | INHALATION_SPRAY | Freq: Every day | RESPIRATORY_TRACT | Status: DC
  Administered 2016-09-21 – 2016-09-24 (×4): 1 via RESPIRATORY_TRACT
  Filled 2016-09-21: qty 28

## 2016-09-21 MED ORDER — ORAL CARE MOUTH RINSE
15.0000 mL | Freq: Two times a day (BID) | OROMUCOSAL | Status: DC
  Administered 2016-09-21 – 2016-09-22 (×2): 15 mL via OROMUCOSAL

## 2016-09-21 MED ORDER — UMECLIDINIUM BROMIDE 62.5 MCG/INH IN AEPB
1.0000 | INHALATION_SPRAY | Freq: Every day | RESPIRATORY_TRACT | Status: DC
  Filled 2016-09-21: qty 7

## 2016-09-21 MED ORDER — ALBUTEROL SULFATE (2.5 MG/3ML) 0.083% IN NEBU
2.5000 mg | INHALATION_SOLUTION | Freq: Three times a day (TID) | RESPIRATORY_TRACT | Status: DC
Start: 2016-09-21 — End: 2016-09-21

## 2016-09-21 MED ORDER — IPRATROPIUM-ALBUTEROL 0.5-2.5 (3) MG/3ML IN SOLN
3.0000 mL | Freq: Three times a day (TID) | RESPIRATORY_TRACT | Status: DC
  Administered 2016-09-21 – 2016-09-24 (×11): 3 mL via RESPIRATORY_TRACT
  Filled 2016-09-21 (×11): qty 3

## 2016-09-21 NOTE — Evaluation (Signed)
Physical Therapy Evaluation Patient Details Name: [name] MRN: [id number] DOB: 03-21-1956 Today's Date: 09/21/2016   History of Present Illness  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  Clinical Impression  Pt admitted with above diagnosis. Pt currently with functional limitations due to the deficits listed below (see PT Problem List). Presents with Significantly decr funcitonal capacity; Session conducted on 4L O2 via [place] (her baseline); desatted to 82% with minimal activity ; recovered to 94% with 5-7 minutes of seated rest; BP post walk in standing 90/52 with some reports of dizziness; HR max observed 124; Hopefully we will see improvement if thoracentesis is done;  Pt will benefit from skilled PT to increase their independence and safety with mobility to allow discharge to the venue listed below.       Follow Up Recommendations Home health PT;Other (comment) [facility] for chronic disease management)    Equipment Recommendations  None recommended by PT    Recommendations for Other Services OT consult     Precautions / Restrictions Precautions Precautions: Fall;Other (comment) Precaution Comments: Desats significantly with activity       Mobility  Bed Mobility Overal bed mobility: Modified Independent             General bed mobility comments: Slowly, used rails  Transfers Overall transfer level: Needs assistance Equipment used: Rolling walker (2 wheeled) Transfers: Sit to/from Stand Sit to Stand: Min guard         General transfer comment: Cues for hand placementand safety; cues to self-monitor for activiyt tolerance  Ambulation/Gait Ambulation/Gait assistance: Min guard Ambulation  Distance (Feet): 12 Feet Assistive device: Rolling walker (2 wheeled) Gait Pattern/deviations: Step-through pattern;Decreased step length - right;Decreased step length - left     General Gait Details: Cues to self-monitor for activity tolerance  Stairs            Wheelchair Mobility    Modified Rankin (Stroke Patients Only)       Balance                                             Pertinent Vitals/Pain Pain Assessment: No/denies pain    Home Living Family/patient expects to be discharged to:: Private residence Living Arrangements: Other relatives Biomedical scientist ) Available Help at Discharge: Family;Available PRN/intermittently Type of Home: Apartment Home Access: Level entry     Home Layout: One level Home Equipment: Walker - 2 wheels;Shower seat;Hand held shower head      Prior Function Level of Independence: Independent;Independent with assistive device(s)         Comments: uses walker in the community, no device in the home, one recent fall when getting up from sitting, eventually made her way to the couch     Hand Dominance   Dominant Hand: Right    Extremity/Trunk Assessment   Upper Extremity Assessment Upper Extremity Assessment: Overall WFL for tasks assessed    Lower Extremity Assessment Lower Extremity Assessment: Generalized weakness       Communication   Communication: No difficulties  Cognition Arousal/Alertness: Awake/alert Behavior During Therapy: WFL for tasks assessed/performed Overall Cognitive Status:  Within Functional Limits for tasks assessed                                        General Comments General comments (skin integrity, edema, etc.): Significantly decr funcitonal capacity; Session conducted on 4L O2 via [place] (her baseline); desatted to 82% with minimal activity ; recovered to 94% with 5-7 minutes of seated rest; BP post walk in standing 90/52 with some reports of dizziness; HR max  observed 124    Exercises     Assessment/Plan    PT Assessment Patient needs continued PT services  PT Problem List Decreased strength;Decreased range of motion;Decreased activity tolerance;Decreased balance;Decreased mobility;Decreased knowledge of use of DME;Cardiopulmonary status limiting activity       PT Treatment Interventions DME instruction;Gait training;Functional mobility training;Therapeutic activities;Therapeutic exercise;Patient/family education    PT Goals (Current goals can be found in the Care Plan section)  Acute Rehab PT Goals Patient Stated Goal: breathe better PT Goal Formulation: With patient Time For Goal Achievement: 10/05/16 Potential to Achieve Goals: Good    Frequency Min 3X/week   Barriers to discharge        Co-evaluation               AM-PAC PT "6 Clicks" Daily Activity  Outcome Measure Difficulty turning over in bed (including adjusting bedclothes, sheets and blankets)?: Total Difficulty moving from lying on back to sitting on the side of the bed? : A Little Difficulty sitting down on and standing up from a chair with arms (e.g., wheelchair, bedside commode, etc,.)?: A Little Help needed moving to and from a bed to chair (including a wheelchair)?: A Little Help needed walking in hospital room?: A Little Help needed climbing 3-5 steps with a railing? : A Lot 6 Click Score: 15    End of Session Equipment Utilized During Treatment: Gait belt;Oxygen Activity Tolerance: Treatment limited secondary to medical complications (Comment) (significant desturation) Patient left: in chair;with call bell/phone within reach;with nursing/sitter in room Nurse Communication: Mobility status PT Visit Diagnosis: Unsteadiness on feet (R26.81);Dizziness and giddiness (R42);Other (comment) (decr functional capacity)    Time: 1117-3567 PT Time Calculation (min) (ACUTE ONLY): 23 min   Charges:   PT Evaluation $PT Eval Moderate Complexity: 1 Procedure PT  Treatments $Gait Training: 8-22 mins   PT G Codes:        Roney Marion, PT  Acute Rehabilitation Services Pager [phone] Office [phone]   Colletta [place] 09/21/2016, 10:25 AM

## 2016-09-21 NOTE — Progress Notes (Signed)
   Subjective: Patient was evaluated this morning on rounds. She states she is hungry and wants to eat. She states that her breathing is stable. She reports frustration with having to be hospitalized frequently due to her shortness of breath.  She was agreeable to the thought of pleurodesis versus Pleurx catheter placement for her recurrent left-sided pleural effusions.  Objective:  Vital signs in last 24 hours: Vitals:   09/21/16 0600 09/21/16 0940 09/21/16 0945 09/21/16 1300  BP: 100/64  (!) 92/58   Pulse: 86  96   Resp:   20   Temp:   99.4 F (37.4 C)   TempSrc:   Oral   SpO2:  95% 96% 91%  Weight:      Height:       Physical Exam  Constitutional: She is well-developed, well-nourished, and in no distress.  Morbidly obese  Cardiovascular: Normal rate, regular rhythm and normal heart sounds.  Exam reveals no gallop and no friction rub.   No murmur heard. Pulmonary/Chest: Effort normal. No respiratory distress.  Dullness to percussion over the left lung base diminished breath sounds on the left posterior wall.   Abdominal: Soft. She exhibits no distension. There is no tenderness.  Musculoskeletal: She exhibits no edema.     Assessment/Plan:  Active Problems:   Acute on chronic respiratory failure (HCC)  Acute on chronic respiratory failure secondary to recurrent left-sided pleural effusion Patient is satting 91-96% on 4 L nasal cannula. We have consulted CVTS for evaluation for pleurodesis or Pleurx catheter placement.   -appreciate CVTS recommendations - once CVTS evaluates and based on recommendations can restart diet  COPD: She is currently on Trelegy (fluticasone, umeclidinium, vilanterol) and albuteorl prn at home. Uses 4L home oxygen.  No signs of COPD exacerbation.   -Continue home meds - Duoneb 3 times daily  HFpEF: No signs of volume overload on exam.  -monitor  CAD: -ASA 81 mg daily - Rosuvastatin 20 mg daily  Chronic Constipation: -Senna  prn -Miralax prn -Colace  DM type 2: Not on any medications at home. Last A1c 6.5.  -SSI  DVT PPx: Heparin SQ  Code Status: DNR/DNI  Dispo: Anticipated discharge pending procedure.   Kalman Shan [place], DO 09/21/2016, 1:33 PM Pager: [phone]

## 2016-09-21 NOTE — Consult Note (Signed)
Reason for Consult:Recurrent left pleural effusion Referring Physician: Dr. Ellison [name] [name] is an 61 y.o. [name]  HPI: 61 yo woman presents with a cc/o SOB.  [name] [name] is a 61 yo woman with a PMH of lung cancer treated with radiation in 2016, morbid obesity, COPD on 4L [place] home O2, pulmonary hypertension, CAD, CHF, as well as multiple other problems. She has had issues with a left pleural effusion dating back to November 2017. She had a thoracentesis at that time. She did fairly well until April when she required a 2nd thoracentesis. She recurred fairly quickly and had a third on 08/24/2016. She presented to the ED yesterday with a 2 day history of worsening dyspnea. She would have to stop after walking even a few feet. In the ED BNP was mildly elevated and a CXR showed a moderate pleural effusion. She was admitted.    Past Medical History:  Diagnosis Date  . Arthritis    "some scattered" (03/03/2015)  . CHF (congestive heart failure) ([place])   . Chronic bronchitis ([place])    "get it most years" (03/03/2015)  . COPD (chronic obstructive pulmonary disease) (HCC)    Severe. Gold Stage IV.  PFTs (12/2008) - severe obstructive airway disease. Active tobacco use. Requires 4L O2 at home.  . Coronary artery disease    S/P PCI of LAD with DES (12/2008). Total occlusion of RCA noted at that time., medically managed. ACS ruled out 03/2009 with Lexiscan myoview . Followed by [facility].  . Depression   . Diabetes mellitus without complication ([place])   . Diastolic dysfunction    2-D Echo (12/2008) - Normal LV Systolic funciton with EF 60-65%. Grade 1 diastolid dysfunction. No regional wall motion abnormalities. Moderate pulmonary HTN with PA peak pressure 17mHg.  . Full dentures   . GERD (gastroesophageal reflux disease)    S/P Nissen fundoplication.  .Marland KitchenHistory of hiatal hernia   . Hx MRSA infection    Recurrent MRSA thigh abscesses.  . Hyperlipidemia   . Lung cancer ([place]    "left"  . Obesity    . On home oxygen therapy since 2010   4L all the time  . Pneumonia   . Prediabetes    HgbA1c 6.4 (12/2008)  . Pulmonary hypertension ([place]    2-D Echo (039/7673 - Systolic pressure was moderately increased. PA peak pressure  586mg. 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 Followed by Dr. WrJoya GaskinsLVelora Heckler . Pulmonary nodule, right    Small right middle lobe nodule. Stable as of 12/2008.  . Marland Kitchenhortness of breath dyspnea    with a lot of exertion; if fluid builds up    Past Surgical History:  Procedure Laterality Date  . ABDOMINAL HYSTERECTOMY  1980's   "still have my cervix"  . APPENDECTOMY    . BACK SURGERY    . BILATERAL OOPHORECTOMY  1979  . BREAST LUMPECTOMY Right 1990's    (biopsy negative)  . CORONARY ANGIOPLASTY WITH STENT PLACEMENT  2010   [facility]  . HERNIA REPAIR    . IR THORACENTESIS ASP PLEURAL SPACE W/IMG GUIDE  07/18/2016  . IR THORACENTESIS ASP PLEURAL SPACE W/IMG GUIDE  08/24/2016  . KNEE ARTHROSCOPY Right ~ 2000  . LUMonroeURGERY  2001   "herniated discs[facility] . NISSEN FUNDOPLICATION    . RIGHT HEART CATHETERIZATION N/A 11/09/2012  Procedure: RIGHT HEART CATH;  Surgeon: Larey Dresser, MD;  Location: [facility] CATH LAB;  Service: Cardiovascular;  Laterality: N/A;  . TONSILLECTOMY    . TUBAL LIGATION  1979  . VIDEO BRONCHOSCOPY WITH ENDOBRONCHIAL ULTRASOUND N/A 12/15/2014   Procedure: VIDEO BRONCHOSCOPY WITH ENDOBRONCHIAL ULTRASOUND;  Surgeon: Ivin Poot, MD;  Location: [facility] OR;  Service: Thoracic;  Laterality: N/A;    Family History  Problem Relation Age of Onset  . Heart disease Mother 50       Deceased from MI at 51yo  . Hypertension Mother   . Heart disease Father 9       Deceased of MI age 32yo  . Hypertension Father   . Hypertension Brother   . Lung cancer Unknown        Grandmother    Social History:  reports  that she quit smoking about 10 months ago. Her smoking use included Cigarettes. She started smoking about 47 years ago. She has a 45.00 pack-year smoking history. She has never used smokeless tobacco. She reports that she does not drink alcohol or use drugs.  Allergies:  Allergies  Allergen Reactions  . Fluconazole Anaphylaxis and Itching  . Atorvastatin Other (See Comments)    Dizziness     Medications:  Scheduled: . aspirin  81 mg Oral QHS  . buPROPion  150 mg Oral BID  . famotidine  10 mg Oral Daily  . fluticasone furoate-vilanterol  1 puff Inhalation Daily   Or  . umeclidinium bromide  1 puff Inhalation Daily  . furosemide  60 mg Intravenous Once  . gabapentin  600 mg Oral QHS  . heparin  5,000 Units Subcutaneous Q8H  . insulin aspart  0-9 Units Subcutaneous TID WC  . ipratropium-albuterol  3 mL Nebulization TID  . mouth rinse  15 mL Mouth Rinse BID  . pantoprazole  40 mg Oral Daily  . rosuvastatin  20 mg Oral Daily    Results for orders placed or performed during the hospital encounter of 09/20/16 (from the past 48 hour(s))  Basic metabolic panel     Status: Abnormal   Collection Time: 09/20/16  7:28 PM  Result Value Ref Range   Sodium 138 135 - 145 mmol/L   Potassium 4.5 3.5 - 5.1 mmol/L   Chloride 95 (L) 101 - 111 mmol/L   CO2 35 (H) 22 - 32 mmol/L   Glucose, Bld 157 (H) 65 - 99 mg/dL   BUN 12 6 - 20 mg/dL   Creatinine, Ser 2.13 (H) 0.44 - 1.00 mg/dL   Calcium 8.6 (L) 8.9 - 10.3 mg/dL   GFR calc non Af Amer 24 (L) >60 mL/min   GFR calc Af Amer 27 (L) >60 mL/min    Comment: (NOTE) The eGFR has been calculated using the CKD EPI equation. This calculation has not been validated in all clinical situations. eGFR's persistently <60 mL/min signify possible Chronic Kidney Disease.    Anion gap 8 5 - 15  CBC with Differential     Status: Abnormal   Collection Time: 09/20/16  7:28 PM  Result Value Ref Range   WBC 9.8 4.0 - 10.5 K/uL   RBC 3.38 (L) 3.87 - 5.11 MIL/uL    Hemoglobin 9.5 (L) 12.0 - 15.0 g/dL   HCT 32.8 (L) 36.0 - 46.0 %   MCV 97.0 78.0 - 100.0 fL   MCH 28.1 26.0 - 34.0 pg   MCHC 29.0 (L) 30.0 - 36.0 g/dL   RDW 13.7 11.5 -  15.5 %   Platelets 239 150 - 400 K/uL   Neutrophils Relative % 75 %   Neutro Abs 7.4 1.7 - 7.7 K/uL   Lymphocytes Relative 8 %   Lymphs Abs 0.7 0.7 - 4.0 K/uL   Monocytes Relative 13 %   Monocytes Absolute 1.2 (H) 0.1 - 1.0 K/uL   Eosinophils Relative 4 %   Eosinophils Absolute 0.4 0.0 - 0.7 K/uL   Basophils Relative 0 %   Basophils Absolute 0.0 0.0 - 0.1 K/uL  Brain natriuretic peptide     Status: Abnormal   Collection Time: 09/20/16  7:28 PM  Result Value Ref Range   B Natriuretic Peptide 112.0 (H) 0.0 - 100.0 pg/mL  I-stat troponin, ED     Status: None   Collection Time: 09/20/16  7:57 PM  Result Value Ref Range   Troponin i, poc 0.00 0.00 - 0.08 ng/mL   Comment 3            Comment: Due to the release kinetics of cTnI, a negative result within the first hours of the onset of symptoms does not rule out myocardial infarction with certainty. If myocardial infarction is still suspected, repeat the test at appropriate intervals.   Urinalysis, Routine w reflex microscopic     Status: None   Collection Time: 09/20/16  8:08 PM  Result Value Ref Range   Color, Urine YELLOW YELLOW   APPearance CLEAR CLEAR   Specific Gravity, Urine 1.009 1.005 - 1.030   pH 5.0 5.0 - 8.0   Glucose, UA NEGATIVE NEGATIVE mg/dL   Hgb urine dipstick NEGATIVE NEGATIVE   Bilirubin Urine NEGATIVE NEGATIVE   Ketones, ur NEGATIVE NEGATIVE mg/dL   Protein, ur NEGATIVE NEGATIVE mg/dL   Nitrite NEGATIVE NEGATIVE   Leukocytes, UA NEGATIVE NEGATIVE  Glucose, capillary     Status: Abnormal   Collection Time: 09/20/16 11:15 PM  Result Value Ref Range   Glucose-Capillary 145 (H) 65 - 99 mg/dL  Basic metabolic panel     Status: Abnormal   Collection Time: 09/21/16  3:48 AM  Result Value Ref Range   Sodium 139 135 - 145 mmol/L    Potassium 4.0 3.5 - 5.1 mmol/L   Chloride 94 (L) 101 - 111 mmol/L   CO2 35 (H) 22 - 32 mmol/L   Glucose, Bld 157 (H) 65 - 99 mg/dL   BUN 12 6 - 20 mg/dL   Creatinine, Ser 2.20 (H) 0.44 - 1.00 mg/dL   Calcium 8.6 (L) 8.9 - 10.3 mg/dL   GFR calc non Af Amer 23 (L) >60 mL/min   GFR calc Af Amer 26 (L) >60 mL/min    Comment: (NOTE) The eGFR has been calculated using the CKD EPI equation. This calculation has not been validated in all clinical situations. eGFR's persistently <60 mL/min signify possible Chronic Kidney Disease.    Anion gap 10 5 - 15  CBC     Status: Abnormal   Collection Time: 09/21/16  3:48 AM  Result Value Ref Range   WBC 9.9 4.0 - 10.5 K/uL   RBC 3.37 (L) 3.87 - 5.11 MIL/uL   Hemoglobin 9.5 (L) 12.0 - 15.0 g/dL   HCT 32.8 (L) 36.0 - 46.0 %   MCV 97.3 78.0 - 100.0 fL   MCH 28.2 26.0 - 34.0 pg   MCHC 29.0 (L) 30.0 - 36.0 g/dL   RDW 13.8 11.5 - 15.5 %   Platelets 230 150 - 400 K/uL  Glucose, capillary  Status: Abnormal   Collection Time: 09/21/16  8:14 AM  Result Value Ref Range   Glucose-Capillary 196 (H) 65 - 99 mg/dL  Glucose, capillary     Status: Abnormal   Collection Time: 09/21/16 12:26 PM  Result Value Ref Range   Glucose-Capillary 167 (H) 65 - 99 mg/dL   Comment 1 Notify RN    Comment 2 Document in Chart   Glucose, capillary     Status: Abnormal   Collection Time: 09/21/16 12:29 PM  Result Value Ref Range   Glucose-Capillary 157 (H) 65 - 99 mg/dL   Comment 1 Notify RN    Comment 2 Document in Chart   Glucose, capillary     Status: Abnormal   Collection Time: 09/21/16  4:24 PM  Result Value Ref Range   Glucose-Capillary 184 (H) 65 - 99 mg/dL   Comment 1 Notify RN    Comment 2 Document in Chart     Dg Chest Port 1 View  Result Date: 09/20/2016 CLINICAL DATA:  Acute onset of shortness of breath and productive cough. Initial encounter. EXAM: PORTABLE CHEST 1 VIEW COMPARISON:  Chest radiograph performed 08/24/2016 FINDINGS: A moderate left-sided  pleural effusion is noted, increased from the prior study. Vascular congestion is seen. Increased interstitial markings raise concern for mild interstitial edema. There is no evidence of pneumothorax. The cardiomediastinal silhouette is enlarged. No acute osseous abnormalities are seen. IMPRESSION: Increased moderate left pleural effusion. Vascular congestion and cardiomegaly. Increased interstitial markings raise concern for pulmonary edema. Electronically Signed   By: Garald Balding M.D.   On: 09/20/2016 19:36    Review of Systems  Constitutional: Positive for malaise/fatigue. Negative for chills and fever.  Respiratory: Positive for shortness of breath.   Cardiovascular: Positive for orthopnea and leg swelling.  Musculoskeletal: Positive for back pain and joint pain.   Blood pressure 98/62, pulse 90, temperature 98.2 F (36.8 C), temperature source Oral, resp. rate 20, height _0  (1.651 m), weight 258 lb 13.1 oz (117.4 kg), SpO2 95 %. Physical Exam  Vitals reviewed. Constitutional: She is oriented to person, place, and time. No distress.  Morbidly obese. Short of breath with changing position in bed  HENT:  Head: Normocephalic and atraumatic.  Mouth/Throat: No oropharyngeal exudate.  Eyes: Conjunctivae and EOM are normal.  Neck: Neck supple. No thyromegaly present.  Cardiovascular: Normal rate, regular rhythm and normal heart sounds.   Respiratory: She has no wheezes. She has no rales.  Diminished BS bilaterally, absent at left base  GI: Soft. She exhibits no distension. There is no tenderness.  Musculoskeletal: She exhibits edema.  Lymphadenopathy:    She has no cervical adenopathy.  Neurological: She is alert and oriented to person, place, and time. No cranial nerve deficit.  Skin: Skin is warm and dry.    Assessment/Plan: 61 yo woman with a recurrent left pleural effusion. She has required 2 thoracenteses within the past 2 months and now presents with recurrence of the  effusion over a relatively short time frame. Cytology has shown inflammation, but no evidence of malignancy.  Options for management include repeated thoracentesis PRN, a pleural catheter or left VATS. Of those, a pleural catheter is the best option for her. It would provide relief, prevent need for readmission and avoid the high risk of a major operation.  I discussed the general nature of the procedure with her. It would be done in the OR with sedation and local. She understands there would be an indwelling catheter that will require  care. I informed her of the indications, risks, benefits and alternatives. She is aware the risks include but are not limited to bleeding, catheter malposition, infection, catheter occlusion, as well as the possibility of other unforeseeable complications. She would like to proceed.  Dr. Prescott Gum to place catheter on Friday.  [name] [name] 09/21/2016, 5:35 PM

## 2016-09-21 NOTE — Progress Notes (Signed)
New Admission Note:  Arrival Method: Stretcher  Mental Orientation: Alert and oriented x 4 Telemetry: Box 02 NSR Assessment: Completed Skin: Warm and dry  IV: NSL  Pain: Denies  Tubes: N/A Safety Measures: Safety Fall Prevention Plan was given, discussed and signed. Admission: Completed [facility] [place] Orientation: Patient has been orientated to the room, unit and the staff. Family: None   Orders have been reviewed and implemented. Will continue to monitor the patient. Call light has been placed within reach and bed alarm has been activated.   Sima Matas BSN, RN  Phone Number: [phone]

## 2016-09-22 ENCOUNTER — Inpatient Hospital Stay (HOSPITAL_COMMUNITY): Payer: Medicare HMO

## 2016-09-22 DIAGNOSIS — J9620 Acute and chronic respiratory failure, unspecified whether with hypoxia or hypercapnia: Secondary | ICD-10-CM

## 2016-09-22 LAB — GLUCOSE, CAPILLARY
GLUCOSE-CAPILLARY: 156 mg/dL — AB (ref 65–99)
GLUCOSE-CAPILLARY: 211 mg/dL — AB (ref 65–99)
Glucose-Capillary: 182 mg/dL — ABNORMAL HIGH (ref 65–99)
Glucose-Capillary: 182 mg/dL — ABNORMAL HIGH (ref 65–99)

## 2016-09-22 LAB — SURGICAL PCR SCREEN
MRSA, PCR: NEGATIVE
Staphylococcus aureus: NEGATIVE

## 2016-09-22 MED ORDER — CEFAZOLIN SODIUM-DEXTROSE 2-4 GM/100ML-% IV SOLN
2.0000 g | INTRAVENOUS | Status: AC
  Administered 2016-09-23: 2 g via INTRAVENOUS
  Filled 2016-09-22 (×2): qty 100

## 2016-09-22 NOTE — Progress Notes (Signed)
Medicine attending: I examined this patient today and I concur with the evaluation and management plan as recorded by resident physician Dr. Kalman Shan. We greatly appreciate cardiovascular surgery consultation.  Patient understands and agrees to placement of a Pleurx drainage catheter for recurrent, exudative, left pleural effusion.  Catheter placement scheduled for tomorrow. Clinically stable today.  Exam unchanged. No new lab.  On June 20: CKD 4.  Potassium 4.0.  Chronic normochromic anemia hemoglobin 9.5 g.  Platelets 230,000.  Stable to proceed with minor surgical procedure.

## 2016-09-22 NOTE — Progress Notes (Signed)
Inpatient Diabetes Program Recommendations  AACE/ADA: New Consensus Statement on Inpatient Glycemic Control (2015)  Target Ranges:  Prepandial:   less than 140 mg/dL      Peak postprandial:   less than 180 mg/dL (1-2 hours)      Critically ill patients:  140 - 180 mg/dL   Lab Results  Component Value Date   GLUCAP 182 (H) 09/22/2016   HGBA1C 5.8 (H) 07/18/2016    Review of Glycemic Control  Results for JAKEYA, GHERARDI (MRN [id number]) as of 09/22/2016 10:19  Ref. Range 09/21/2016 12:26 09/21/2016 12:29 09/21/2016 16:24 09/21/2016 21:27 09/22/2016 07:49  Glucose-Capillary Latest Ref Range: 65 - 99 mg/dL 167 (H) 157 (H) 184 (H) 239 (H) 182 (H)    Diabetes history: Type 2 Outpatient Diabetes medications: none Current orders for Inpatient glycemic control: Novolog 0-9 units q4h  Inpatient Diabetes Program Recommendations:  When the patient completes her procedure, please change Novolog q4h to tid and add Novolog hs correction 0-5 units qhs  Gentry Fitz, RN, [place], [place], CDE Diabetes Coordinator Inpatient Diabetes Program  [phone] (Team Pager) [phone] ([place]) 09/22/2016 10:23 AM

## 2016-09-22 NOTE — Progress Notes (Signed)
OT Cancellation Note and Discharge  Patient Details Name: [name] MRN: [id number] DOB: October 28, 1955   Cancelled Treatment:    Reason Eval/Treat Not Completed: Other (comment). Pt just getting back from [facility] to sitting EOB and struggling with breathing (but doing purse lipped breathing on her own as I entered) sats 82% but went up to 90% after ~3 minutes of purse lipped breathing--pt on 4 liters of O2 throughout. She is fully aware of energy conservation strategies and has all needed DME at home and is hopeful that drain placed tomorrow will greatly help her breathing. No skilled OT needs identified, we will sign off.  Almon Register [phone] 09/22/2016, 12:51 PM

## 2016-09-22 NOTE — Progress Notes (Signed)
Procedure(s) (LRB): INSERTION PLEURAL DRAINAGE CATHETER (Left) Subjective: Patient examined and most recent chest x-ray images personally reviewed History of left lung cancer status post chemoradiation Recurrent left pleural effusion cytology negative requiring thoracentesis. Agree with plan for Pleurx catheter. I have discussed left Pleurx catheter placement with the patient including the benefits and risks. This will be performed tomorrow. Hopefully she should be able to go home on Saturday after a.m. x-ray.  Objective: Vital signs in last 24 hours: Temp:  [98.2 F (36.8 C)-99.2 F (37.3 C)] 99 F (37.2 C) (06/21 0900) Pulse Rate:  [90-110] 104 (06/21 0900) Cardiac Rhythm: Sinus tachycardia (06/20 2030) Resp:  [18-20] 20 (06/21 0900) BP: (94-107)/(44-62) 94/51 (06/21 0900) SpO2:  [85 %-95 %] 91 % (06/21 0900) Weight:  [251 lb 1.7 oz (113.9 kg)] 251 lb 1.7 oz (113.9 kg) (06/20 2128)  Hemodynamic parameters for last 24 hours:    Intake/Output from previous day: 06/20 0701 - 06/21 0700 In: 960 [P.O.:960] Out: 1250 [Urine:1250] Intake/Output this shift: Total I/O In: 420 [P.O.:420] Out: 0   Obese chronically ill Diminished breath sounds on left side Neuro intact No skin rash or lesions on chest  Lab Results:  Recent Labs  09/20/16 1928 09/21/16 0348  WBC 9.8 9.9  HGB 9.5* 9.5*  HCT 32.8* 32.8*  PLT 239 230   BMET:  Recent Labs  09/20/16 1928 09/21/16 0348  NA 138 139  K 4.5 4.0  CL 95* 94*  CO2 35* 35*  GLUCOSE 157* 157*  BUN 12 12  CREATININE 2.13* 2.20*  CALCIUM 8.6* 8.6*    PT/INR: No results for input(s): LABPROT, INR in the last 72 hours. ABG    Component Value Date/Time   PHART 7.305 (L) 04/15/2016 0612   HCO3 32.4 (H) 04/15/2016 0612   TCO2 34 04/15/2016 0612   O2SAT 86.0 04/15/2016 0612   CBG (last 3)   Recent Labs  09/21/16 1624 09/21/16 2127 09/22/16 0749  GLUCAP 184* 239* 182*    Assessment/Plan: S/P Procedure(s)  (LRB): INSERTION PLEURAL DRAINAGE CATHETER (Left) Orders placed for left Pleurx placement in OR tomorrow for recurrent left pleural effusion   LOS: 2 days    Tharon Aquas Trigt III 09/22/2016

## 2016-09-22 NOTE — Progress Notes (Signed)
Subjective:  Patient was evaluated this morning on rounds. She states that she will have a catheter placed tomorrow. She reports that her respiratory status is stable currently.  Objective:  Vital signs in last 24 hours: Vitals:   09/22/16 0457 09/22/16 0758 09/22/16 0800 09/22/16 0900  BP: (!) 101/44   (!) 94/51  Pulse: (!) 110   (!) 104  Resp: 20   20  Temp: 98.8 F (37.1 C)   99 F (37.2 C)  TempSrc: Oral   Oral  SpO2: (!) 85% 91% 91% 91%  Weight:      Height:       Physical Exam  Constitutional: She is well-developed, well-nourished, and in no distress.  Morbidly obese  Cardiovascular: Normal rate, regular rhythm and normal heart sounds.  Exam reveals no gallop and no friction rub.   No murmur heard. Pulmonary/Chest: Effort normal. No respiratory distress.  Dullness to percussion over the left lung base diminished breath sounds on the left posterior wall.   Abdominal: Soft. She exhibits no distension. There is no tenderness.  Musculoskeletal: She exhibits no edema.     Assessment/Plan:  Active Problems:   Acute on chronic respiratory failure (HCC)   Morbid obesity (HCC)  Acute on chronic respiratory failure secondary to recurrent left-sided pleural effusion Patient is satting 91% on 4 L nasal cannula.  Left Pleurx placement in OR tomorrow for recurrent left pleural effusion -appreciate CVTS recommendations - NPO at midnight  COPD: She is currently on Trelegy (fluticasone, umeclidinium, vilanterol) and albuteorl prn at home. Uses 4L home oxygen.  No signs of COPD exacerbation.   -Continue home meds - Duoneb 3 times daily  HFpEF: No signs of volume overload on exam.  -monitor  CAD: -ASA 81 mg daily - Rosuvastatin 20 mg daily  Chronic Constipation: -Senna prn -Miralax prn -Colace  DM type 2: Not on any medications at home. Last A1c 6.5.  -SSI  DVT PPx: Heparin SQ  Code Status: DNR/DNI  Dispo: Anticipated discharge pending procedure.    Kalman Shan [place], DO 09/22/2016, 10:07 AM Pager: [phone]

## 2016-09-23 ENCOUNTER — Inpatient Hospital Stay (HOSPITAL_COMMUNITY): Payer: Medicare HMO

## 2016-09-23 ENCOUNTER — Encounter: Payer: Self-pay | Admitting: *Deleted

## 2016-09-23 ENCOUNTER — Encounter (HOSPITAL_COMMUNITY): Payer: Self-pay | Admitting: Certified Registered Nurse Anesthetist

## 2016-09-23 ENCOUNTER — Encounter (HOSPITAL_COMMUNITY)
Admission: EM | Disposition: A | Discharge status: Home Health | Payer: Self-pay | Source: Home / Self Care | Attending: Oncology

## 2016-09-23 ENCOUNTER — Inpatient Hospital Stay (HOSPITAL_COMMUNITY): Payer: Medicare HMO | Admitting: Certified Registered Nurse Anesthetist

## 2016-09-23 HISTORY — PX: CHEST TUBE INSERTION: SHX231

## 2016-09-23 LAB — GLUCOSE, CAPILLARY
GLUCOSE-CAPILLARY: 161 mg/dL — AB (ref 65–99)
GLUCOSE-CAPILLARY: 185 mg/dL — AB (ref 65–99)
GLUCOSE-CAPILLARY: 174 mg/dL — AB (ref 65–99)
GLUCOSE-CAPILLARY: 351 mg/dL — AB (ref 65–99)
GLUCOSE-CAPILLARY: 221 mg/dL — AB (ref 65–99)

## 2016-09-23 LAB — VITAMIN D 25 HYDROXY (VIT D DEFICIENCY, FRACTURES): VIT D 25 HYDROXY: 35.6 ng/mL (ref 30.0–100.0)

## 2016-09-23 LAB — MICROALBUMIN, URINE: Microalb, Ur: 30.1 ug/mL — ABNORMAL HIGH

## 2016-09-23 SURGERY — INSERTION, PLEURAL DRAINAGE CATHETER
Anesthesia: Monitor Anesthesia Care | Site: Chest | Laterality: Left

## 2016-09-23 MED ORDER — DEXAMETHASONE SODIUM PHOSPHATE 10 MG/ML IJ SOLN
INTRAMUSCULAR | Status: DC | PRN
  Administered 2016-09-23: 10 mg via INTRAVENOUS

## 2016-09-23 MED ORDER — DEXAMETHASONE SODIUM PHOSPHATE 10 MG/ML IJ SOLN
INTRAMUSCULAR | Status: AC
  Filled 2016-09-23: qty 1

## 2016-09-23 MED ORDER — ONDANSETRON HCL 4 MG/2ML IJ SOLN
INTRAMUSCULAR | Status: DC | PRN
  Administered 2016-09-23: 4 mg via INTRAVENOUS

## 2016-09-23 MED ORDER — HEPARIN SODIUM (PORCINE) 5000 UNIT/ML IJ SOLN
5000.0000 [IU] | Freq: Three times a day (TID) | INTRAMUSCULAR | Status: DC
  Administered 2016-09-24: 5000 [IU] via SUBCUTANEOUS
  Filled 2016-09-23 (×2): qty 1

## 2016-09-23 MED ORDER — ROSUVASTATIN CALCIUM 20 MG PO TABS: 20.0000 mg | ORAL_TABLET | Freq: Every day | ORAL | Status: DC

## 2016-09-23 MED ORDER — PROPOFOL 500 MG/50ML IV EMUL
INTRAVENOUS | Status: DC | PRN
  Administered 2016-09-23: 50 ug/kg/min via INTRAVENOUS

## 2016-09-23 MED ORDER — ONDANSETRON HCL 4 MG/2ML IJ SOLN
INTRAMUSCULAR | Status: AC
  Filled 2016-09-23: qty 2

## 2016-09-23 MED ORDER — LIDOCAINE 2% (20 MG/ML) 5 ML SYRINGE
INTRAMUSCULAR | Status: DC | PRN
Start: 2016-09-23 — End: 2016-09-23
  Administered 2016-09-23: 30 mg via INTRAVENOUS

## 2016-09-23 MED ORDER — FAMOTIDINE 20 MG PO TABS
10.0000 mg | ORAL_TABLET | Freq: Every day | ORAL | Status: DC
  Administered 2016-09-23 – 2016-09-24 (×2): 10 mg via ORAL
  Filled 2016-09-23 (×2): qty 1

## 2016-09-23 MED ORDER — LIDOCAINE 2% (20 MG/ML) 5 ML SYRINGE
INTRAMUSCULAR | Status: AC
  Filled 2016-09-23: qty 5

## 2016-09-23 MED ORDER — LIDOCAINE HCL 1 % IJ SOLN
INTRAMUSCULAR | Status: DC | PRN
  Administered 2016-09-23: 15 mL

## 2016-09-23 MED ORDER — ROSUVASTATIN CALCIUM 20 MG PO TABS
20.0000 mg | ORAL_TABLET | Freq: Every day | ORAL | Status: DC
  Administered 2016-09-23: 20 mg via ORAL
  Filled 2016-09-23: qty 1

## 2016-09-23 MED ORDER — 0.9 % SODIUM CHLORIDE (POUR BTL) OPTIME
TOPICAL | Status: DC | PRN
  Administered 2016-09-23: 1000 mL

## 2016-09-23 MED ORDER — LIDOCAINE HCL (PF) 1 % IJ SOLN
INTRAMUSCULAR | Status: AC
  Filled 2016-09-23: qty 30

## 2016-09-23 MED ORDER — EPINEPHRINE PF 1 MG/10ML IJ SOSY
PREFILLED_SYRINGE | INTRAMUSCULAR | Status: AC
  Filled 2016-09-23: qty 10

## 2016-09-23 MED ORDER — PANTOPRAZOLE SODIUM 40 MG PO TBEC
40.0000 mg | DELAYED_RELEASE_TABLET | Freq: Every day | ORAL | Status: DC
  Administered 2016-09-23 – 2016-09-24 (×2): 40 mg via ORAL
  Filled 2016-09-23 (×2): qty 1

## 2016-09-23 MED ORDER — BUPROPION HCL ER (SR) 150 MG PO TB12
150.0000 mg | ORAL_TABLET | Freq: Two times a day (BID) | ORAL | Status: DC
  Administered 2016-09-23 – 2016-09-24 (×3): 150 mg via ORAL
  Filled 2016-09-23 (×3): qty 1

## 2016-09-23 MED ORDER — LACTATED RINGERS IV SOLN
INTRAVENOUS | Status: DC | PRN
  Administered 2016-09-23: 10:00:00 via INTRAVENOUS

## 2016-09-23 MED ORDER — SODIUM CHLORIDE 0.9 % IV SOLN
INTRAVENOUS | Status: DC
  Administered 2016-09-23: 09:00:00 via INTRAVENOUS

## 2016-09-23 SURGICAL SUPPLY — 30 items
ADH SKN CLS APL DERMABOND .7 (GAUZE/BANDAGES/DRESSINGS) ×1
BRUSH SCRUB EZ PLAIN DRY (MISCELLANEOUS) ×3 IMPLANT
CANISTER SUCT 3000ML PPV (MISCELLANEOUS) ×2 IMPLANT
COVER SURGICAL LIGHT HANDLE (MISCELLANEOUS) ×2 IMPLANT
DERMABOND ADVANCED (GAUZE/BANDAGES/DRESSINGS) ×1
DERMABOND ADVANCED .7 DNX12 (GAUZE/BANDAGES/DRESSINGS) ×1 IMPLANT
DRAPE C-ARM 42X72 X-RAY (DRAPES) ×2 IMPLANT
DRAPE LAPAROSCOPIC ABDOMINAL (DRAPES) ×2 IMPLANT
GLOVE BIO SURGEON STRL SZ7.5 (GLOVE) ×4 IMPLANT
GOWN STRL REUS W/ TWL LRG LVL3 (GOWN DISPOSABLE) ×2 IMPLANT
GOWN STRL REUS W/TWL LRG LVL3 (GOWN DISPOSABLE) ×4
KIT BASIN OR (CUSTOM PROCEDURE TRAY) ×2 IMPLANT
KIT PLEURX DRAIN CATH 1000ML (MISCELLANEOUS) ×2 IMPLANT
KIT PLEURX DRAIN CATH 15.5FR (DRAIN) ×2 IMPLANT
KIT ROOM TURNOVER OR (KITS) ×2 IMPLANT
NDL HYPO 25GX1X1/2 BEV (NEEDLE) ×1 IMPLANT
NEEDLE HYPO 25GX1X1/2 BEV (NEEDLE) ×2 IMPLANT
NS IRRIG 1000ML POUR BTL (IV SOLUTION) ×2 IMPLANT
PACK GENERAL/GYN (CUSTOM PROCEDURE TRAY) ×2 IMPLANT
PAD ARMBOARD 7.5X6 YLW CONV (MISCELLANEOUS) ×4 IMPLANT
SET DRAINAGE LINE (MISCELLANEOUS) IMPLANT
SUT ETHILON 3 0 PS 1 (SUTURE) ×2 IMPLANT
SUT SILK 2 0 SH (SUTURE) ×2 IMPLANT
SUT VIC AB 3-0 SH 8-18 (SUTURE) ×2 IMPLANT
SWAB COLLECTION DEVICE MRSA (MISCELLANEOUS) ×1 IMPLANT
SYR CONTROL 10ML LL (SYRINGE) ×2 IMPLANT
TOWEL OR 17X24 6PK STRL BLUE (TOWEL DISPOSABLE) ×3 IMPLANT
TOWEL OR 17X26 10 PK STRL BLUE (TOWEL DISPOSABLE) ×1 IMPLANT
VALVE REPLACEMENT CAP (MISCELLANEOUS) IMPLANT
WATER STERILE IRR 1000ML POUR (IV SOLUTION) ×1 IMPLANT

## 2016-09-23 NOTE — Progress Notes (Signed)
Report called to [facility] in short stay.  All questions answered.

## 2016-09-23 NOTE — Care Management Note (Signed)
Case Management Note  Patient Details  Name: WILBURTA MILBOURN MRN: [id number] Date of Birth: 12-May-1955  Subjective/Objective:       CM following for progression and d/c planning.             Action/Plan: 09/24/2015 Met with pt re [place] and completed form for PleurX catheter. Application for drain system completed and faxed to [place] on 09/23/16 @ 4:20pm. . [facility] in to see pt and will begin services on 09/26/16. Pt RN, Gardiner Rhyme will order suction drains for pt to take home as ordered drains will not arrive in time for Monday drainage. Pt asking for phone number for [facility] , she is interested in changing her oxygen supplier. Pt was encouraged to complete this process at home.   Expected Discharge Date:  09/23/16               Expected Discharge Plan:  [place]  In-House Referral:  NA  Discharge planning Services  CM Consult  Post Acute Care Choice:  Durable Medical Equipment, Home Health Choice offered to:  Patient, [facility] POA / Guardian  DME Arranged:   (PleurX drain system) DME Agency:  [facility]le[facility]  Specialist.   HH Arranged:  RN [place] Agency:  [place]  Status of Service:  Completed, signed off  If discussed at [place] of Stay Meetings, dates discussed:    Additional Comments:  Adron Bene, RN 09/23/2016, 4:32 PM

## 2016-09-23 NOTE — Care Management Important Message (Signed)
Important Message  Patient Details  Name: [name] MRN: [id number] Date of Birth: 1955/08/31   Medicare Important Message Given:  Yes    Orbie Pyo 09/23/2016, 12:56 PM

## 2016-09-23 NOTE — Op Note (Signed)
[name], [name] NO.:  [id number]  MEDICAL RECORD NO.:  [id number]  LOCATION:  6E02C                        FACILITY:  [facility]  PHYSICIAN:  Ivin Poot, M.D.  DATE OF BIRTH:  1955-05-20  DATE OF PROCEDURE:  09/23/2016 DATE OF DISCHARGE:                              OPERATIVE REPORT   OPERATION:  Placement of left PleurX catheter and drainage of left pleural effusion.  SURGEON:  Ivin Poot, M.D.  ANESTHESIA:  IV conscious sedation monitored by anesthesia and local 1% lidocaine.  PREOPERATIVE DIAGNOSES:  Recurrent left pleural effusion, history of left lung cancer.  POSTOPERATIVE DIAGNOSES:  Recurrent left pleural effusion, history of left lung cancer.  CLINICAL NOTE:  The patient presented for recurrent symptoms of shortness of breath and a recurrent left effusion since thoracentesis last month.  A PleurX catheter was recommended.  I discussed the procedure in detail with the patient including benefits and risks and expected postoperative care.  She demonstrated her understanding and agreed to proceed with surgery.  The catheter was placed under fluoroscopic guidance and drained 750 mL of serosanguineous left pleural fluid.  This fluid previously had been shown to have negative cytology.  Fluid was sent for culture.  DESCRIPTION OF PROCEDURE:  The patient was brought to the operating room and placed supine on the operating room table where she was positioned for left PleurX catheter.  The left chest was prepped and draped as a sterile field.  A proper time-out was performed.  Lidocaine 1% was infiltrated in the anterior axillary line at the fifth interspace.  A second point of infiltration was at the midclavicular line at the costal margin for the exit site of the catheter.  A small incision was made at the upper anesthetized area and further local lidocaine was infiltrated down to the intercostal muscle.  Through the 5th interspace, a  syringe with a sheath was inserted and pleural fluid was drained.  Through the sheath after the needle was removed, a guidewire was passed. This was in the pleural space.  Next, a catheter was tunneled from the lower incision to the upper incision.  Next, over the guidewire dilator, a dilator sheath was inserted in the left pleural space.  Next, the catheter was inserted in the pleural space through the tear- away sheath and they connected to a vacuum bottle and drained 750 mL.  The upper incision was closed with interrupted subcutaneous Vicryl and 3- 0 nylon for the skin.  The lower incision was closed with a single silk suture, which was used to also secure the catheter to the skin.  The catheter was capped with a sterile cap, dressed with a sterile drape, and the patient was rolled back on her back.  Chest x-ray in the operating room showed evacuation of the left pleural effusion without pneumothorax noted.  She returned to recovery room.     Ivin Poot, M.D.     PV/MEDQ  D:  09/23/2016  T:  09/23/2016  Job:  [id number]

## 2016-09-23 NOTE — Progress Notes (Addendum)
Evaluated patient after left pleural drainage catheter placement.  Patient satting 87% on her home 4L O2 via [place].  Lung exam with improvement to LLL, now hearing breath sounds.  Patient states she feels improvement in her respiratory status after the catheter placement.  She denies respiratory distress.  Will continue to monitor for changes.

## 2016-09-23 NOTE — Progress Notes (Signed)
[name]. She claims that her oxygen levels "bounces back and forth", "i just want to get back to my room" 02 sat ranges from 88-90 on 4L [place]. Denies any shortness of breath. Patient also claims that she's back to her norm. Dr. Deatra Canter notified.

## 2016-09-23 NOTE — Progress Notes (Signed)
   Subjective:  Patient was evaluated this morning on rounds.  She reports that her respiratory status is stable currently.  She states she is a little anxious for the procedure today.  Objective:  Vital signs in last 24 hours: Vitals:   09/22/16 2107 09/22/16 2133 09/23/16 0404 09/23/16 0858  BP:  104/65 (!) 115/55 (!) 110/58  Pulse: 96 91 97 94  Resp: _0 Temp: 98.5 F (36.9 C)  97.8 F (36.6 C) 98.7 F (37.1 C)  TempSrc: Oral  Oral Oral  SpO2: 95% 92% 92% 90%  Weight: 263 lb 14.4 oz (119.7 kg)     Height:       Physical Exam  Constitutional: She is well-developed, well-nourished, and in no distress.  Morbidly obese  Cardiovascular: Normal rate, regular rhythm and normal heart sounds.  Exam reveals no gallop and no friction rub.   No murmur heard. Pulmonary/Chest: Effort normal. No respiratory distress.  Dullness to percussion over the left lung base diminished breath sounds on the left posterior wall.   Abdominal: Soft. She exhibits no distension. There is no tenderness.  Musculoskeletal: She exhibits no edema.     Assessment/Plan:  Active Problems:   Acute on chronic respiratory failure (HCC)   Morbid obesity (HCC)  Acute on chronic respiratory failure secondary to recurrent left-sided pleural effusion Patient is satting 90% on 4 L nasal cannula.  Plan is for Left Pleurx placement in OR today for recurrent left pleural effusion -appreciate CVTS recommendations - NPO  - can resume diet after procedure  COPD: She is currently on Trelegy (fluticasone, umeclidinium, vilanterol) and albuteorl prn at home. Uses 4L home oxygen.  No signs of COPD exacerbation.   -Continue home meds - Duoneb 3 times daily  HFpEF: No signs of volume overload on exam.  -monitor  CAD: -ASA 81 mg daily - Rosuvastatin 20 mg daily  Chronic Constipation: -Senna prn -Miralax prn -Colace  DM type 2: Not on any medications at home. Last A1c 6.5.  -SSI  DVT PPx: Heparin  SQ  Code Status: DNR/DNI  Dispo: Anticipated discharge pending procedure.   Kalman Shan [place], DO 09/23/2016, 8:59 AM Pager: [phone]

## 2016-09-23 NOTE — Progress Notes (Signed)
O2 86% on 4 L Dr. Heber [place] notified.

## 2016-09-23 NOTE — Progress Notes (Signed)
The patient was examined and preop studies reviewed. There has been no change from the prior exam and the patient is ready for surgery.  plan LEFT Pleurx catheter on C [place]

## 2016-09-23 NOTE — Anesthesia Preprocedure Evaluation (Signed)
Anesthesia Evaluation  Patient identified by MRN, date of birth, ID band Patient awake    Reviewed: Allergy & Precautions, NPO status , Patient's Chart, lab work & pertinent test results  Airway Mallampati: I  TM Distance: >3 FB Neck ROM: Full    Dental  (+) Dental Advisory Given   Pulmonary shortness of breath, sleep apnea , COPD,  oxygen dependent, Current Smoker, former smoker,    breath sounds clear to auscultation       Cardiovascular hypertension, Pt. on medications + CAD, + Cardiac Stents and +CHF   Rhythm:Regular Rate:Normal     Neuro/Psych Depression negative neurological ROS     GI/Hepatic Neg liver ROS, GERD  ,  Endo/Other  diabetesMorbid obesity  Renal/GU CRFRenal disease     Musculoskeletal  (+) Arthritis ,   Abdominal   Peds  Hematology negative hematology ROS (+)   Anesthesia Other Findings   Reproductive/Obstetrics                             Lab Results  Component Value Date   WBC 9.9 09/21/2016   HGB 9.5 (L) 09/21/2016   HCT 32.8 (L) 09/21/2016   MCV 97.3 09/21/2016   PLT 230 09/21/2016   Lab Results  Component Value Date   CREATININE 2.20 (H) 09/21/2016   BUN 12 09/21/2016   NA 139 09/21/2016   K 4.0 09/21/2016   CL 94 (L) 09/21/2016   CO2 35 (H) 09/21/2016    Anesthesia Physical  Anesthesia Plan  ASA: IV  Anesthesia Plan: MAC   Post-op Pain Management:    Induction: Intravenous  PONV Risk Score and Plan: 2 and Ondansetron and Dexamethasone  Airway Management Planned: Natural Airway and Simple Face Mask  Additional Equipment:   Intra-op Plan:   Post-operative Plan:   Informed Consent: I have reviewed the patients History and Physical, chart, labs and discussed the procedure including the risks, benefits and alternatives for the proposed anesthesia with the patient or authorized representative who has indicated his/her understanding and  acceptance.   Dental advisory given  Plan Discussed with: CRNA  Anesthesia Plan Comments:         Anesthesia Quick Evaluation

## 2016-09-23 NOTE — Anesthesia Postprocedure Evaluation (Signed)
Anesthesia Post Note  Patient: [name]  Procedure(s) Performed: Procedure(s) (LRB): INSERTION LEFT PLEURAL DRAINAGE CATHETER (Left)     Patient location during evaluation: PACU Anesthesia Type: MAC Level of consciousness: awake and alert Pain management: pain level controlled Vital Signs Assessment: post-procedure vital signs reviewed and stable Respiratory status: spontaneous breathing, nonlabored ventilation, respiratory function stable and patient connected to nasal cannula oxygen Cardiovascular status: stable and blood pressure returned to baseline Anesthetic complications: no    Last Vitals:  Vitals:   09/23/16 1209 09/23/16 1244  BP:  (!) 141/77  Pulse: 98 100  Resp: (!) 21 (!) 21  Temp: 37.1 C 36.3 C    Last Pain:  Vitals:   09/23/16 1244  TempSrc: Oral  PainSc:                  [name]

## 2016-09-23 NOTE — Discharge Instructions (Signed)
Home Health to be provided by [facility] stating Monday, September 26, 2016.   Phone number for [facility], [phone]

## 2016-09-23 NOTE — Transfer of Care (Signed)
Immediate Anesthesia Transfer of Care Note  Patient: [name]  Procedure(s) Performed: Procedure(s): INSERTION LEFT PLEURAL DRAINAGE CATHETER (Left)  Patient Location: PACU  Anesthesia Type:MAC  Level of Consciousness: awake, patient cooperative and responds to stimulation  Airway & Oxygen Therapy: Patient Spontanous Breathing and Patient connected to face mask oxygen  Post-op Assessment: Report given to RN and Post -op Vital signs reviewed and stable  Post vital signs: Reviewed and stable  Last Vitals:  Vitals:   09/23/16 0858 09/23/16 1054  BP: (!) 110/58 115/68  Pulse: 94 (!) 102  Resp: 20 (!) 28  Temp: 37.1 C 37.1 C    Last Pain:  Vitals:   09/23/16 1054  TempSrc:   PainSc: (P) 0-No pain      Patients Stated Pain Goal: 0 (03/52/48 1859)  Complications: No apparent anesthesia complications

## 2016-09-23 NOTE — Progress Notes (Signed)
PT Cancellation Note  Patient Details Name: [name] MRN: [id number] DOB: 03/25/1956   Cancelled Treatment:    Reason Eval/Treat Not Completed: Patient at procedure or test/unavailable. Pt currently having catheter placed. Will check back later as time allows.    Scheryl Marten PT, DPT  [phone]  09/23/2016, 10:00 AM

## 2016-09-23 NOTE — Consult Note (Signed)
   [facility] CM Inpatient Consult   09/23/2016  CAYTLIN BETTER 1955/08/25 [id number]    Discussed [facility] Care Management re-engagement. Ms. Kelsay is agreeable and written consent obtained.   Lives with grandson and daughter lives nearby. Uses [employer] transportation and [employer]. Primary Care MD is Dr. Virgina Jock. Confirmed best contact number is [phone].  Made Ms. Tino aware that [place] Management will not interfere or replace home health services. Confirmed with inpatient RNCM that Ms. Loseke will have home health thru [facility]. She also has a pleurex drain.   Provided [facility] Care Management packet and contact information.  Inpatient RNCM aware that [facility] to follow.   Will refer to [place]. Has multiple co-morbidites and multiple hospitalizations.   Marthenia Rolling, MSN-Ed, RN,BSN [facility] Liaison [phone]

## 2016-09-23 NOTE — Progress Notes (Signed)
Medicine attending: I examined this patient today and I concur with the evaluation and management plan as recorded by resident physician Dr. Kalman Shan. The patient is clinically stable.  She will proceed with placement of a Pleurx drainage catheter for control of recurrent left pleural effusion.

## 2016-09-23 NOTE — Brief Op Note (Addendum)
09/20/2016 - 09/23/2016  11:00 AM  PATIENT:  [name]  61 y.o. female  PRE-OPERATIVE DIAGNOSIS:  LEFT PLEURAL EFFUSION  POST-OPERATIVE DIAGNOSIS:  LEFT PLEURAL EFFUSION  PROCEDURE:  Procedure(s): INSERTION LEFT PLEURAL DRAINAGE CATHETER (Left) Drainage 750 ml L pleural effusion  SURGEON:  Surgeon(s) and Role:    Ivin Poot, MD - Primary  PHYSICIAN ASSISTANT:   ASSISTANTS: none   ANESTHESIA:   local, IV sedation and MAC  EBL:  Total I/O In: 200 [I.V.:200] Out: 750 [Other:750]  BLOOD ADMINISTERED:none  DRAINS: pleurx Chest Tube(s) in the Left pleural space   LOCAL MEDICATIONS USED:  LIDOCAINE  and Amount: 10 ml  SPECIMEN:  Aspirate  DISPOSITION OF SPECIMEN:  microbiology for culture  COUNTS:  YES  TOURNIQUET:  * No tourniquets in log *  DICTATION: .Dragon Dictation  PLAN OF CARE: return to room 6 E  PATIENT DISPOSITION:  PACU - hemodynamically stable.   Delay start of Pharmacological VTE agent (>24hrs) due to surgical blood loss or risk of bleeding: yes  Patient can be discharged in am 6-23 after CXR and after [facility] has been setup by case manager

## 2016-09-24 ENCOUNTER — Inpatient Hospital Stay (HOSPITAL_COMMUNITY): Payer: Medicare HMO

## 2016-09-24 ENCOUNTER — Encounter (HOSPITAL_COMMUNITY): Payer: Self-pay | Admitting: Cardiothoracic Surgery

## 2016-09-24 LAB — GLUCOSE, CAPILLARY
GLUCOSE-CAPILLARY: 202 mg/dL — AB (ref 65–99)
Glucose-Capillary: 214 mg/dL — ABNORMAL HIGH (ref 65–99)

## 2016-09-24 LAB — CBC
HCT: 33 % — ABNORMAL LOW (ref 36.0–46.0)
Hemoglobin: 9.6 g/dL — ABNORMAL LOW (ref 12.0–15.0)
MCH: 28.2 pg (ref 26.0–34.0)
MCHC: 29.1 g/dL — ABNORMAL LOW (ref 30.0–36.0)
MCV: 97.1 fL (ref 78.0–100.0)
Platelets: 265 10*3/uL (ref 150–400)
RBC: 3.4 MIL/uL — ABNORMAL LOW (ref 3.87–5.11)
RDW: 13.6 % (ref 11.5–15.5)
WBC: 12.6 10*3/uL — ABNORMAL HIGH (ref 4.0–10.5)

## 2016-09-24 MED ORDER — ACETAMINOPHEN-CODEINE #3 300-30 MG PO TABS: 1.0000 | ORAL_TABLET | Freq: Four times a day (QID) | ORAL | 0 refills | Status: AC | PRN

## 2016-09-24 NOTE — Progress Notes (Signed)
  Date: 09/24/2016  Patient name: [name]  Medical record number: [id number]  Date of birth: [birth year]   This patient's plan of care was discussed with the house staff. Please see their note for complete details. I concur with their findings. Reviewed CVTS note, plan for discharge home today with supplies and follow up with that team.    Sid Falcon, MD 09/24/2016, 1:53 PM

## 2016-09-24 NOTE — Plan of Care (Signed)
Problem: Safety: Goal: Ability to remain free from injury will improve Outcome: Progressing No safety issues noted, patient is aware of safety precautions  Problem: Health Behavior/Discharge Planning: Goal: Ability to manage health-related needs will improve Outcome: Progressing Patient verbalizes understanding of her medical issues and treatment  Problem: Pain Managment: Goal: General experience of comfort will improve Outcome: Progressing Medicated once for pain in the left flank with moderate relief  Problem: Physical Regulation: Goal: Will remain free from infection Outcome: Progressing No S/S of infection noted  Problem: Activity: Goal: Risk for activity intolerance will decrease Outcome: Progressing OOB to [facility] independently and tolerates well  Problem: Bowel/Gastric: Goal: Will not experience complications related to bowel motility Outcome: Progressing Denies gastric and bowel issues, last BM 09/21/2016, refused stool sofner

## 2016-09-24 NOTE — Progress Notes (Signed)
1 Day Post-Op Procedure(s) (LRB): INSERTION LEFT PLEURAL DRAINAGE CATHETER (Left) Subjective: cxr today with min L effusion Patient can be discharged home today if HHN and Pleurx supplies have been arranged by case management  Objective: Vital signs in last 24 hours: Temp:  [97.4 F (36.3 C)-98.8 F (37.1 C)] 97.7 F (36.5 C) (06/23 0844) Pulse Rate:  [90-107] 95 (06/23 0844) Cardiac Rhythm: Normal sinus rhythm (06/23 0701) Resp:  [16-21] 18 (06/23 0844) BP: (96-141)/(58-77) 116/74 (06/23 0844) SpO2:  [83 %-97 %] 94 % (06/23 0844) Weight:  [261 lb 12.8 oz (118.8 kg)-261 lb 14.5 oz (118.8 kg)] 261 lb 14.5 oz (118.8 kg) (06/23 0500)  Hemodynamic parameters for last 24 hours:    Intake/Output from previous day: 06/22 0701 - 06/23 0700 In: 884 [P.O.:684; I.V.:200] Out: 2100 [Urine:1350] Intake/Output this shift: Total I/O In: 240 [P.O.:240] Out: 875 [Urine:875]  Surgical site clean, dry  Lab Results:  Recent Labs  09/24/16 0337  WBC 12.6*  HGB 9.6*  HCT 33.0*  PLT 265   BMET: No results for input(s): NA, K, CL, CO2, GLUCOSE, BUN, CREATININE, CALCIUM in the last 72 hours.  PT/INR: No results for input(s): LABPROT, INR in the last 72 hours. ABG    Component Value Date/Time   PHART 7.305 (L) 04/15/2016 0612   HCO3 32.4 (H) 04/15/2016 0612   TCO2 34 04/15/2016 0612   O2SAT 86.0 04/15/2016 0612   CBG (last 3)   Recent Labs  09/23/16 2054 09/24/16 0750 09/24/16 1126  GLUCAP 221* 214* 202*    Assessment/Plan: S/P Procedure(s) (LRB): INSERTION LEFT PLEURAL DRAINAGE CATHETER (Left) Will follow as outpatient in office for care of pleurx catheter   LOS: 4 days    Tharon Aquas Trigt III 09/24/2016

## 2016-09-24 NOTE — Progress Notes (Signed)
Discharge instructions and medications discussed with patient.  Prescription given to patient.  All questions answered.

## 2016-09-24 NOTE — Progress Notes (Signed)
Physical Therapy Treatment Patient Details Name: [name] MRN: [id number] DOB: 1956/02/08 Today's Date: 09/24/2016    History of Present Illness [name] is a 61 year old female with a past medical history significant for lung adenocarcinoma (s/p curative RT to LLL in 02/2015) with recurrent left sided pleural effusions, COPD on 4L home oxygen, pulmonary HTN, HFpEF, CAD, diabetes mellitus type 2, HTN presenting to [facility] ED with complaints of  shortness of breath. Pt now s/p drain placement on 09/23/16.     PT Comments    Pt demonstrates improved tolerance for gait distance this session. Continues to be significantly deconditioned with mobility with O2 sats dropping with any activity. Pt does, however, now have a drain in place to assist with relieving fluid on her lungs. Pt will benefit from continued therapy at discharge in order to improve her mobility and endurance.     Follow Up Recommendations  Home health PT;Other (comment) [facility])     Equipment Recommendations  None recommended by PT    Recommendations for Other Services       Precautions / Restrictions Precautions Precautions: Fall;Other (comment) Precaution Comments: Desats significantly with activity  Restrictions Weight Bearing Restrictions: No    Mobility  Bed Mobility Overal bed mobility: Modified Independent             General bed mobility comments: Slowly, used rails  Transfers Overall transfer level: Needs assistance Equipment used: Rolling walker (2 wheeled) Transfers: Sit to/from Stand Sit to Stand: Min guard         General transfer comment: Cues for hand placementand safety; cues to self-monitor for activiyt tolerance  Ambulation/Gait Ambulation/Gait assistance: Min guard Ambulation Distance (Feet): 50 Feet Assistive device: Rolling walker (2 wheeled) Gait Pattern/deviations: Step-through pattern;Decreased step length - right;Decreased step length - left Gait velocity: decreased Gait  velocity interpretation: Below normal speed for age/gender General Gait Details: Cues to self-monitor for activity tolerance   Stairs            Wheelchair Mobility    Modified Rankin (Stroke Patients Only)       Balance Overall balance assessment: Needs assistance Sitting-balance support: No upper extremity supported;Feet supported Sitting balance-Leahy Scale: Good     Standing balance support: Bilateral upper extremity supported Standing balance-Leahy Scale: Poor Standing balance comment: reliant on RW for mobility in standing                            Cognition Arousal/Alertness: Awake/alert Behavior During Therapy: WFL for tasks assessed/performed Overall Cognitive Status: Within Functional Limits for tasks assessed                                        Exercises      General Comments        Pertinent Vitals/Pain Pain Assessment: No/denies pain    Home Living                      Prior Function            PT Goals (current goals can now be found in the care plan section) Acute Rehab PT Goals Patient Stated Goal: breathe better Progress towards PT goals: Progressing toward goals    Frequency    Min 3X/week      PT Plan Current plan remains appropriate  Co-evaluation              AM-PAC PT "6 Clicks" Daily Activity  Outcome Measure  Difficulty turning over in bed (including adjusting bedclothes, sheets and blankets)?: None Difficulty moving from lying on back to sitting on the side of the bed? : None Difficulty sitting down on and standing up from a chair with arms (e.g., wheelchair, bedside commode, etc,.)?: A Little Help needed moving to and from a bed to chair (including a wheelchair)?: A Little Help needed walking in hospital room?: A Little Help needed climbing 3-5 steps with a railing? : A Lot 6 Click Score: 19    End of Session Equipment Utilized During Treatment: Gait  belt;Oxygen Activity Tolerance: Treatment limited secondary to medical complications (Comment) (deasturation with mobility) Patient left: in bed;with call bell/phone within reach Nurse Communication: Mobility status PT Visit Diagnosis: Unsteadiness on feet (R26.81);Other (comment) (decreased functional capacity)     Time: 5295-5397 PT Time Calculation (min) (ACUTE ONLY): 20 min  Charges:  $Gait Training: 8-22 mins                    G Codes:       [name] PT, DPT  [phone]    [name] 09/24/2016, 1:16 PM

## 2016-09-24 NOTE — Discharge Summary (Signed)
Name: [name] MRN: [id number] DOB: 13-Sep-1955 61 y.o. PCP: Oval Linsey, MD  Date of Admission: 09/20/2016  6:48 PM Date of Discharge: 09/24/2016 Attending Physician: Annia Belt, MD  Discharge Diagnosis: 1. Acute on chronic respiratory failure secondary to recurrent left pleural effusion  Active Problems:   Acute on chronic respiratory failure (HCC)   Morbid obesity ([place])   Discharge Medications: Allergies as of 09/24/2016      Reactions   Fluconazole Anaphylaxis, Itching   Atorvastatin Other (See Comments)   Dizziness       Medication List    TAKE these medications   acetaminophen-codeine 300-30 MG tablet Commonly known as:  TYLENOL #3 Take 1 tablet by mouth every 6 (six) hours as needed for severe pain. What changed:  how much to take   albuterol 108 (90 Base) MCG/ACT inhaler Commonly known as:  VENTOLIN HFA Inhale 1-2 puffs into the lungs every 6 (six) hours as needed for shortness of breath.   aspirin 81 MG chewable tablet Chew 1 tablet (81 mg total) by mouth at bedtime.   buPROPion 150 MG 12 hr tablet Commonly known as:  WELLBUTRIN SR Take 1 tablet (150 mg total) by mouth 2 (two) times daily.   Fluticasone-Umeclidin-Vilant 100-62.5-25 MCG/INH Aepb Commonly known as:  TRELEGY ELLIPTA Inhale 1 puff into the lungs daily.   furosemide 40 MG tablet Commonly known as:  LASIX Take 1 tablet (40 mg total) by mouth daily.   gabapentin 300 MG capsule Commonly known as:  NEURONTIN Take 2 capsules (600 mg total) by mouth at bedtime.   metolazone 2.5 MG tablet Commonly known as:  ZAROXOLYN TAKE 1 TABLET BY MOUTH AS NEEDED(FOR 5 LBS WEIGHT GAIN)   pantoprazole 40 MG tablet Commonly known as:  PROTONIX Take 1 tablet (40 mg total) by mouth daily.   polyethylene glycol packet Commonly known as:  MIRALAX / GLYCOLAX Take 17 g by mouth daily as needed for moderate constipation or severe constipation.   ranitidine 300 MG tablet Commonly known as:   ZANTAC Take 1 tablet (300 mg total) by mouth at bedtime.   rosuvastatin 20 MG tablet Commonly known as:  CRESTOR Take 1 tablet (20 mg total) by mouth daily. What changed:  when to take this   senna 8.6 MG Tabs tablet Commonly known as:  SENOKOT Take 2 tablets (17.2 mg total) by mouth daily as needed for mild constipation or moderate constipation.   vitamin B-12 1000 MCG tablet Commonly known as:  CYANOCOBALAMIN Take 1 tablet (1,000 mcg total) by mouth daily.       Disposition and follow-up:   Ms.Madalena B Lopata was discharged from [facility] in good condition.  At the hospital follow up visit please address:  1.  Pleural drainage and any issues  2.  Labs / imaging needed at time of follow-up: Repeat fluid culture from left pleural drain  3.  Pending labs/ test needing follow-up: none  Follow-up Appointments: Follow-up Information    Oval Linsey, MD. Call in 1 week(s).   Specialty:  Internal Medicine Contact information: [address] [place] [address] [phone]           Hospital Course by problem list: Active Problems:   Acute on chronic respiratory failure ([place])   Morbid obesity ([place])   1. Acute on chronic respiratory failure secondary to left-sided pleural effusion Patient has a history of stage II non-small cell lung cancer diagnosed in August 2016 status post curative radiotherapy  to the left lower lobe lung mass. Patient has had several admissions in the past 6 months with presentations of left-sided pleural effusions requiring thoracentesis.  04/25/2016 - Thoracentesis results were discordant but would meet criteria for an exudative effusion 07/20/2016 - cytology analysis revealed non specific chronic inflammation and reactive mesothelial cells 08/24/2016 -  Exudative effusion Pleural fluid cytology has been consistently negative for any malignant cells. She again presents with worsening shortness of breath and recurrence of  left-sided pleural effusion that was noted on chest x-ray.  On admission she was requiring slightly more oxygen than her home 4 L.  Due to her multiple recurrent effusions cardiovascular thoracic surgery was consulted and a Pleurx catheter was placed. Cultures were done and results showed rare lactobacillus. Discussed case with ID and recommended repeat cultures. If cultures come back growing lactobacillus would recommend treating.    2. Chronic obstructive pulmonary disease She is on Trilogy and albuterol prn. She is on 4 L home oxygen. No signs of COPD exacerbation during admission. Her medications were continued during admission.  3. Chronic diastolic heart failure No signs of volume overload on exam.  Treated with one dose of IV lasix on admission.  4. Coronary Artery Disease Continued home Aspirin 50m and rosuvastatin 241mdaily.   Discharge Vitals:   BP 116/74 (BP Location: Right Arm)   Pulse 95   Temp 97.7 F (36.5 C) (Oral)   Resp 18   Ht _0  (1.651 m)   Wt 261 lb 14.5 oz (118.8 kg)   SpO2 94%   BMI 43.58 kg/m   Pertinent Labs, Studies, and Procedures:  Left sided pleurx catheter placement  Discharge Instructions: Discharge Instructions    AMB Referral to [facility]    Complete by:  As directed    Please assign to [facility]stWritten consent obtained. Multiple comorbidites and multiple hospitalizations. Likely dc over the weekend. [facility] At[facility]hen[facility] RollingMSVirdenRN[facility]JWTGR-[phone] Reason for consult:  Please assign to [facility] Expected date of contact:  1-3 days (reserved for hospital discharges)   Diet - low sodium heart healthy    Complete by:  As directed    Discharge instructions    Complete by:  As directed    An appointment will be made for you to follow up in the Internal Medicine Clinic.  If you do not here back in the next week, please call to make an appointment  for hospital follow up   Increase activity slowly    Complete by:  As directed       Signed: HoValinda PartyDO 09/27/2016, 3:36 PM   Pager: [phone]

## 2016-09-24 NOTE — Progress Notes (Signed)
Spoke with patient she states that her extra drains are in her room to take home. [facility] will start services Monday, she states her daughter will come with her portable tank to provide transport.

## 2016-09-24 NOTE — Progress Notes (Signed)
   Subjective:  Patient was evaluated this morning .  She is in good spirits.  She reports improvement in her breathing since her pleural drainage catheter was placed.  She reports soreness at the site of insertion but pain is stable. She denies fever/chills, nausea/vomiting, or chest pain.  Objective:  Vital signs in last 24 hours: Vitals:   09/23/16 2059 09/24/16 0456 09/24/16 0500 09/24/16 0750  BP: (!) 96/58 108/61    Pulse: 94 90    Resp: 18 16    Temp: 98.4 F (36.9 C) 97.8 F (36.6 C)    TempSrc: Oral Oral    SpO2: 93% (!) 83%  91%  Weight: 261 lb 12.8 oz (118.8 kg)  261 lb 14.5 oz (118.8 kg)   Height:       Physical Exam  Constitutional: She is well-developed, well-nourished, and in no distress.  Morbidly obese  Cardiovascular: Normal rate, regular rhythm and normal heart sounds.  Exam reveals no gallop and no friction rub.   No murmur heard. Pulmonary/Chest: Effort normal. No respiratory distress. She has no wheezes.  Faint coarse breath sounds at the bases bilaterally Good air movement   Abdominal: Soft. She exhibits no distension. There is no tenderness.  Musculoskeletal: She exhibits no edema.     Assessment/Plan:  Active Problems:   Acute on chronic respiratory failure (HCC)   Morbid obesity (HCC)  Acute on chronic respiratory failure secondary to recurrent left-sided pleural effusion Patient is satting 91% on home 4 L nasal cannula.  Patient had a Left Pleurx placement in OR yesterday for recurrent left pleural effusion.  Will have repeat chest x-ray this morning.  Patient reports improvement in her respiratory status. Patient states her home health services are in place to help with drainage care when she leaves the hospital. -appreciate CVTS recommendations - Chest x-ray  COPD: She is currently on Trelegy (fluticasone, umeclidinium, vilanterol) and albuteorl prn at home. Uses 4L home oxygen.  No signs of COPD exacerbation.   -Continue home meds - Duoneb  3 times daily  HFpEF: No signs of volume overload on exam.  -monitor  CAD: -ASA 81 mg daily - Rosuvastatin 20 mg daily  Chronic Constipation: -Senna prn -Miralax prn -Colace  DM type 2: Not on any medications at home. Last A1c 6.5.  -SSI  DVT PPx: Heparin SQ  Code Status: DNR/DNI  Dispo: Anticipated discharge pending chest x-ray and CVTS recommendations.   Kalman Shan [place], DO 09/24/2016, 8:09 AM Pager: [phone]

## 2016-09-26 ENCOUNTER — Ambulatory Visit: Payer: Medicare HMO

## 2016-09-26 NOTE — Progress Notes (Signed)
  This encounter was created in error - please disregard.

## 2016-09-26 NOTE — Addendum Note (Signed)
Addended byCalla Kicks T on: 09/26/2016 02:28 PM   Modules accepted: Level of Service, SmartSet

## 2016-09-27 ENCOUNTER — Other Ambulatory Visit: Payer: Self-pay | Admitting: Internal Medicine

## 2016-09-27 LAB — BODY FLUID CULTURE

## 2016-09-28 ENCOUNTER — Other Ambulatory Visit: Payer: Self-pay | Admitting: *Deleted

## 2016-09-28 IMAGING — DX DG CHEST 2V
2 series · 2 of 2 positions shown · non-contrast
Comparison: 01/26/2014

CLINICAL DATA: Shortness of breath starting today. Right back pain.
Smoker. History of congestive heart failure, COPD.

EXAM:
CHEST  2 VIEW

[chest pa]
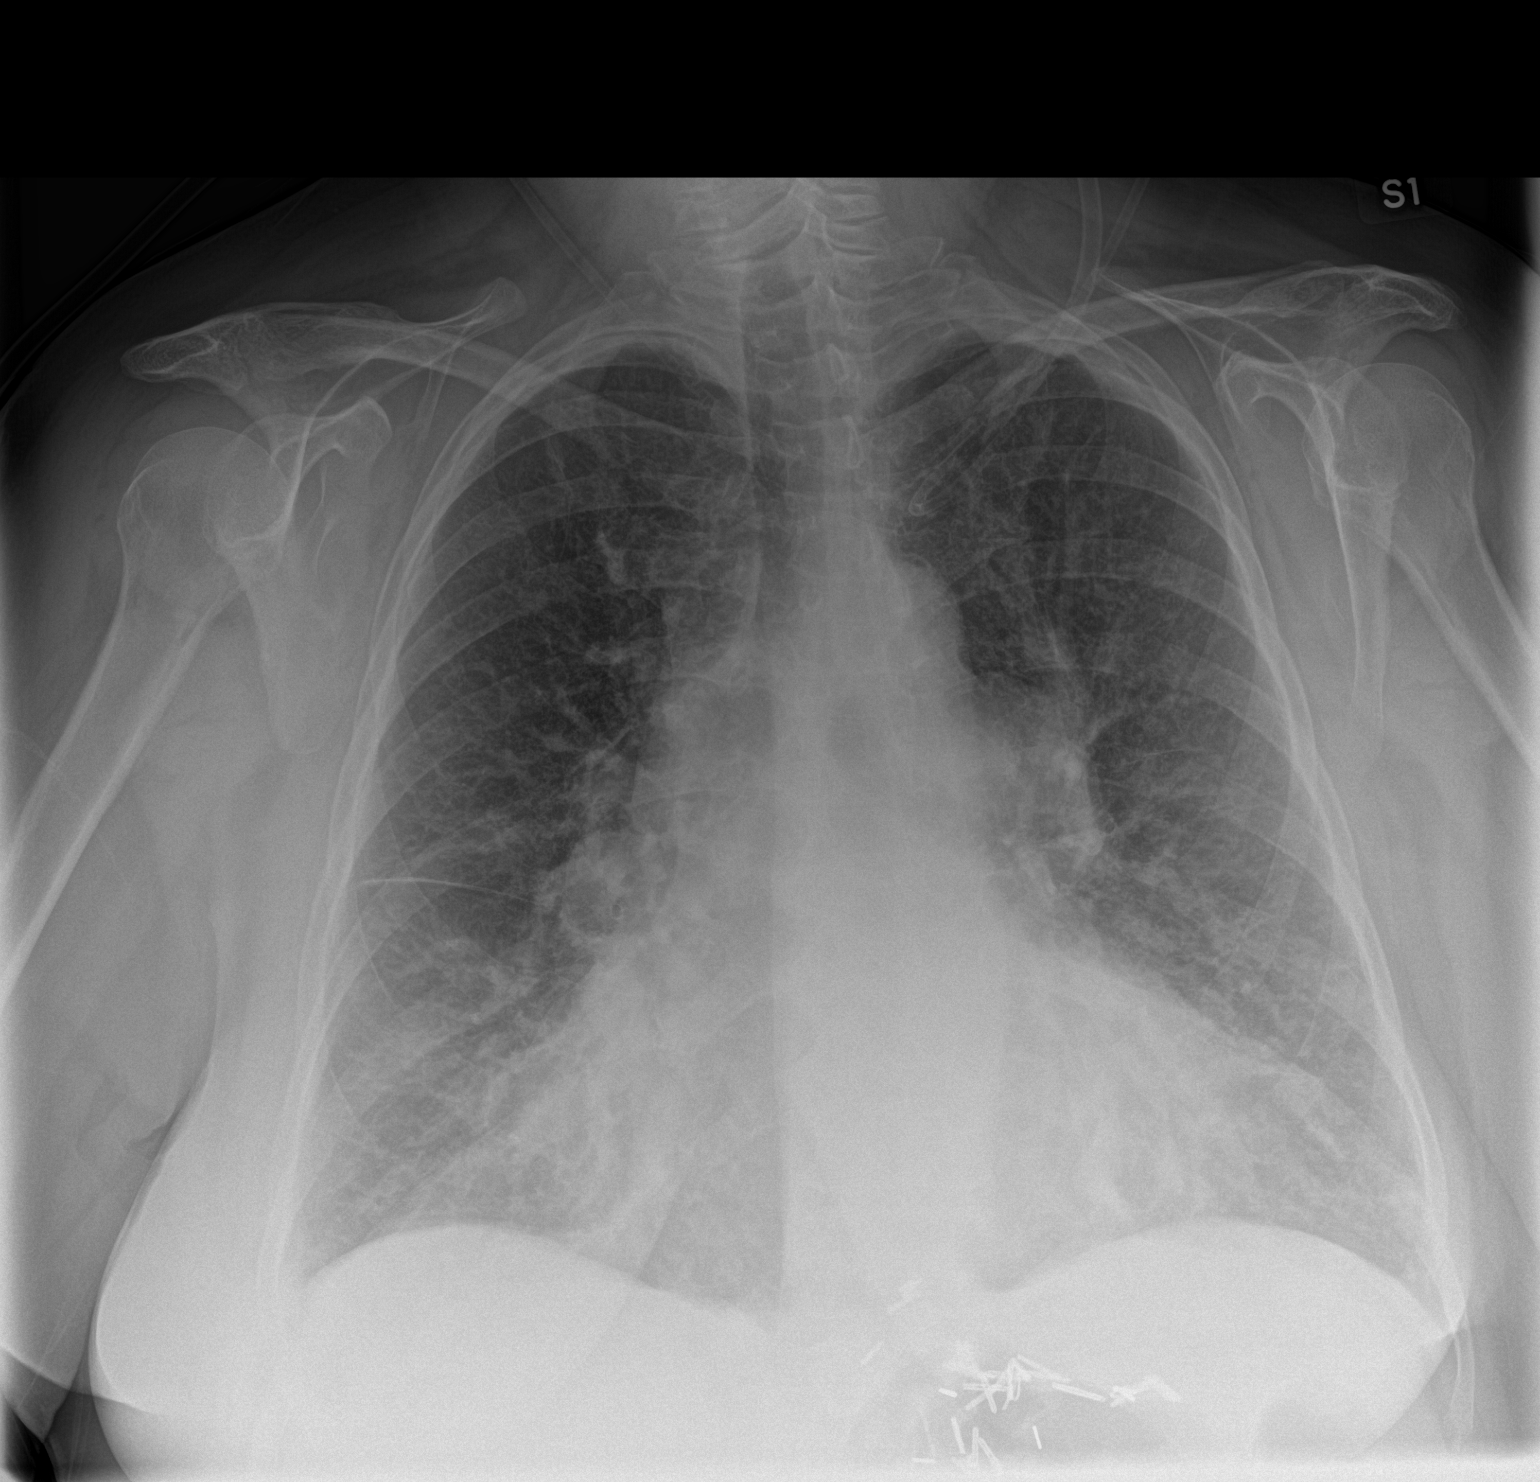

[chest lat]
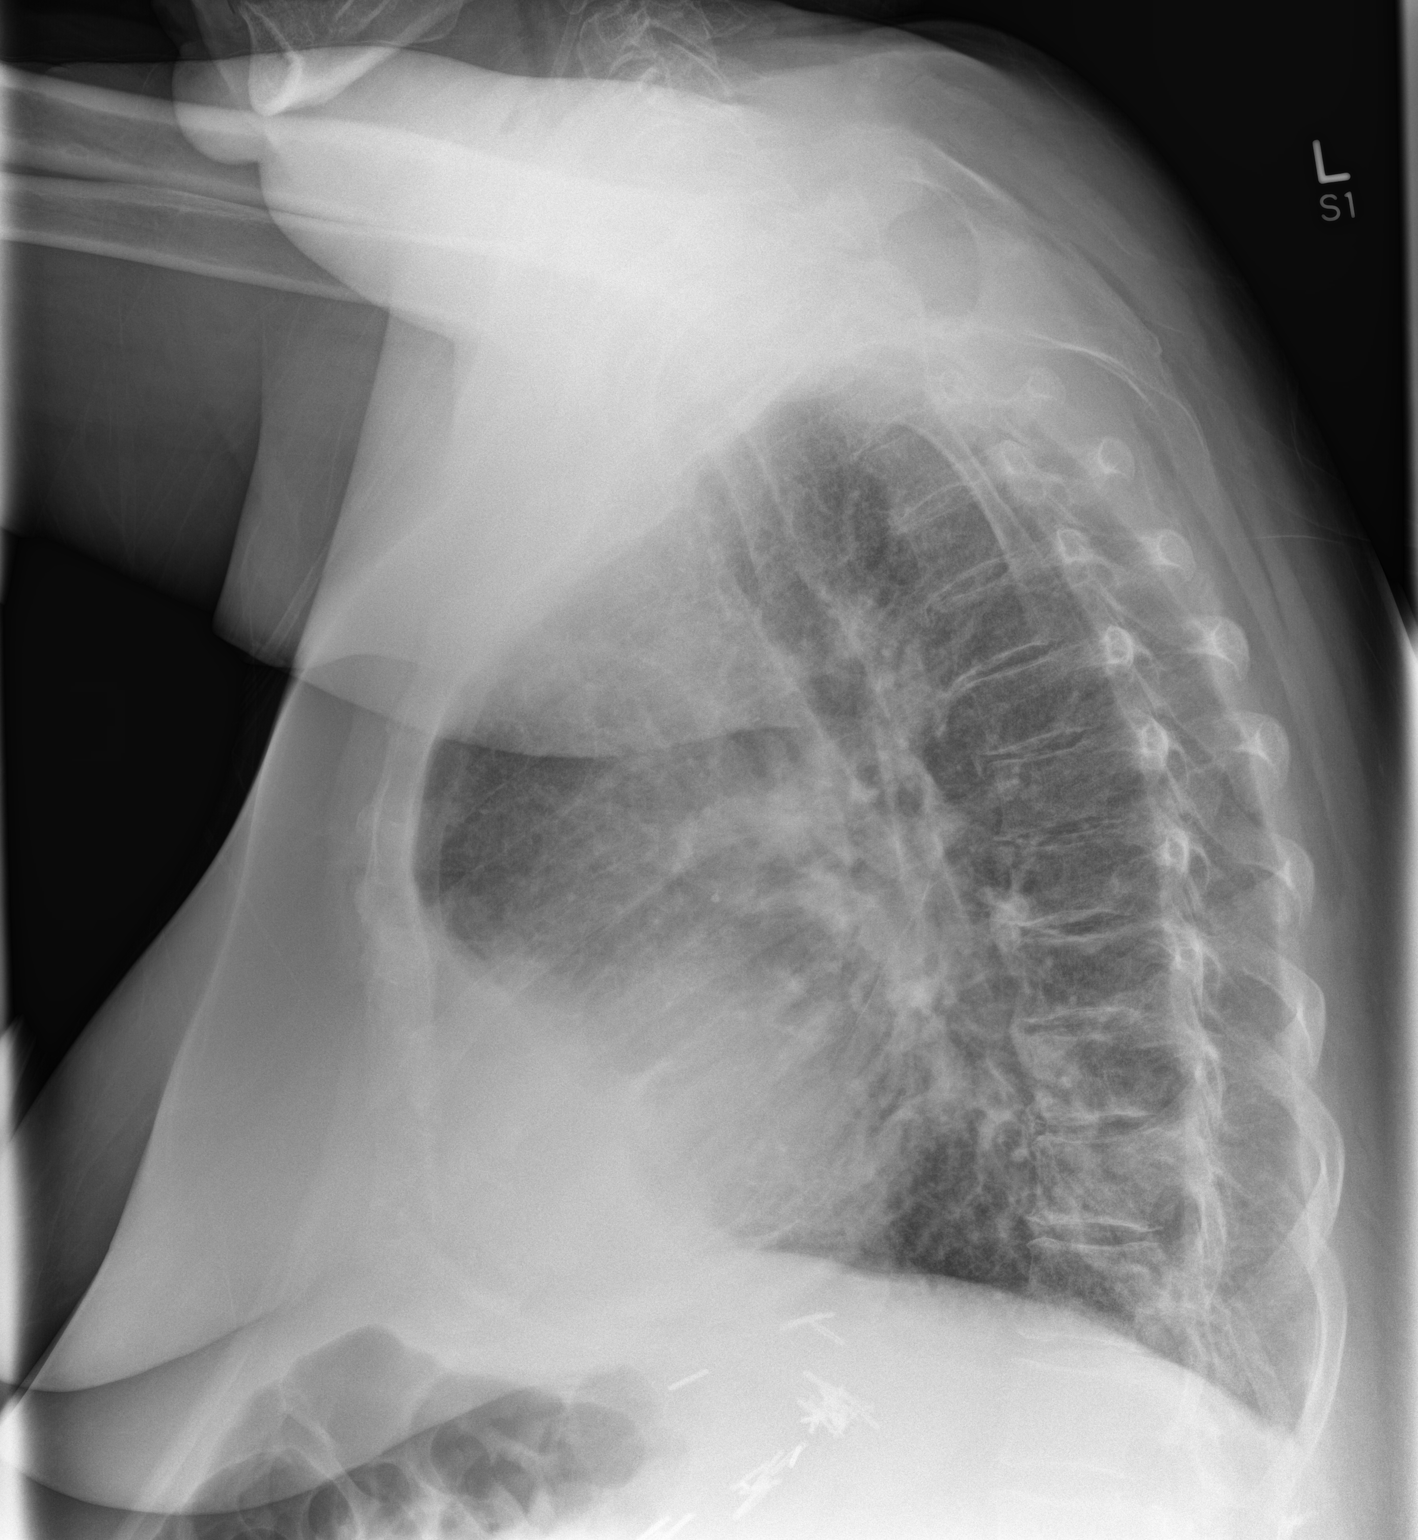

[2 of 2 positions shown; findings below may reference images not displayed]

FINDINGS: There is cardiomegaly. Mild hyperinflation of the lungs. Diffuse
interstitial prominence could reflect interstitial edema. No
confluent opacities or effusions. No acute bony abnormality.
IMPRESSION: Hyperinflation/COPD.

Cardiomegaly. Diffuse interstitial prominence, question early
interstitial edema.

## 2016-09-28 NOTE — Patient Outreach (Signed)
[place] [facility]) Care Management  09/28/2016  [name] June 09, 1955 [id number]   Referral received to engage member in [facility] care management services.  She was recently hospitalized for pleural effusion and had a pleural drain placed.  Per chart, she also has history of heart failure, diabetes, lung cancer, COPD, and pulmonary hypertension.  Call placed to start transition of care program, no answer, phone rang busy.  Unable to leave a message, will follow up tomorrow.    Valente David, [place], MSN [place] [phone]

## 2016-09-29 ENCOUNTER — Other Ambulatory Visit: Payer: Self-pay | Admitting: Licensed Clinical Social Worker

## 2016-09-29 ENCOUNTER — Other Ambulatory Visit: Payer: Self-pay | Admitting: *Deleted

## 2016-09-29 DIAGNOSIS — E119 Type 2 diabetes mellitus without complications: Secondary | ICD-10-CM | POA: Diagnosis not present

## 2016-09-29 DIAGNOSIS — I110 Hypertensive heart disease with heart failure: Secondary | ICD-10-CM | POA: Diagnosis not present

## 2016-09-29 DIAGNOSIS — K219 Gastro-esophageal reflux disease without esophagitis: Secondary | ICD-10-CM | POA: Diagnosis not present

## 2016-09-29 DIAGNOSIS — J90 Pleural effusion, not elsewhere classified: Secondary | ICD-10-CM | POA: Diagnosis not present

## 2016-09-29 DIAGNOSIS — I272 Pulmonary hypertension, unspecified: Secondary | ICD-10-CM | POA: Diagnosis not present

## 2016-09-29 DIAGNOSIS — I2510 Atherosclerotic heart disease of native coronary artery without angina pectoris: Secondary | ICD-10-CM | POA: Diagnosis not present

## 2016-09-29 DIAGNOSIS — F329 Major depressive disorder, single episode, unspecified: Secondary | ICD-10-CM | POA: Diagnosis not present

## 2016-09-29 DIAGNOSIS — I5030 Unspecified diastolic (congestive) heart failure: Secondary | ICD-10-CM | POA: Diagnosis not present

## 2016-09-29 DIAGNOSIS — J449 Chronic obstructive pulmonary disease, unspecified: Secondary | ICD-10-CM | POA: Diagnosis not present

## 2016-09-29 NOTE — Patient Outreach (Signed)
[place] [facility]) Care Management  09/29/2016  [name] 03/09/1956 [id number]  Assessment- CSW completed initial outreach on 09/29/16 after receiving new referral. Patient is in need of food and transportation assistance. Patient answered and provided HIPPA verifications. CSW introduced self, reason for call and of [facility] social work services. Patient is agreeable to assistance. Patient reports that she was approved for [employer] services last year but does not know if she is still active with them. She reports that she usually uses transportation through [facility] but only has 3 round trip rides left until December of 2018. CSW contacted [employer] and confirmed that patient is able to use SCAT services and she doesn't need to be re-evaluated until 03/03/17. CSW provided update to patient. Patient reports that she is having difficulty affording groceries at this time and has been struggling financially. CSW completed FAF and approved patient to gain financial assistance through [place]. CSW will provide patient with a gift card to assist with gaining food. CSW also reviewed other food assistance resources. CSW was able to schedule home visit for 10/04/16. CSW will review how to schedule a [employer] ride, provide transportation resources, provide financial resources, provide food assistance resources, provide gift card in order to gain groceries and an additional social work support needed.  [facility] CM Care Plan Problem One     Most Recent Value  Care Plan Problem One  Lack of community resources and ongoing financial and transportation barriers  Role Documenting the Problem One  Clinical Social Worker  Care Plan for Problem One  Active  [facility] CM Short Term Goal #1   Patient will gain transportation resources and understand how to set up a ride with SCAT services within 30 days  [facility] CM Short Term Goal #1 Start Date  09/29/16  Interventions for Short Term Goal #1  CSW will complete home visit  and provide information on transportation resources. Patient was unsure if she was still active with [employer]AT and CSW completed call and confirmed that she will need to be re-evaluated for SCAT by 03/03/17.  [facility] CM Short Term Goal #2   Patient will gain financial resource education and financial assistance to assist with gaining groceries within 30 days  [facility] CM Short Term Goal #2 Start Date  09/29/16  Interventions for Short Term Goal #2  CSW will complete home visit within 30 days and will provide financial assistance through the [employer] in order to assist her with gaining groceries. CSW will also provide food pantry and financial assistance education and handouts.      Plan-CSW will send involvement letter to PCP. CSW will complete home visit next week.  Eula Fried, BSW, MSW, [email or web address] Phone: [phone] Fax: [phone]

## 2016-09-29 NOTE — Patient Outreach (Signed)
[facility] [facility]) Care Management  09/29/2016  [name] 01-06-1956 [id number]   2nd attempt to contact member for transition of care successful.  Identity verified.  This care manager introduced self and stated purpose of call.  [facility] care management services explained, member familiar as she has been active in the past.  She agrees to be involved again, new consent obtained during hospitalization.    She report continued shortness of breath, state she was supposed to have her dressing changed and drainage released from her pleural catheter 3 times a week, Monday, Wednesday, & Friday.  However, she state she has not been seen by the home health nurse.  She state that she was contacted by the nurse and scheduled an appointment for yesterday, but no one showed up.  She report she has spoken to the nurse and will have a visit today.  Advised to contact this care manager if visit does not occur.  Member state that she was supposed to have follow up appointment with primary MD earlier this week, but did not have transportation secured.  She uses [employer] transportation and did not have the appropriate amount of time to reserve in advance.  She state she only has a couple rides left for the year, unsure if she is still eligible for SCAT services as she was approved over a year ago and has not used services so far.  She is aware that social worker referral has been placed for assistance.  Since she missed appointment, advised to contact office to reschedule.   Patient was recently discharged from hospital and all medications have been reviewed.  She report compliance with all medications, denies the need for assistance.  She already receives assistance through the Extra Help program, all medications cost less than $8 each.  She does express concern regarding affordability of food.  She state her appetite has decreased, but she has a hard time following her low salt diet due to finances.  Advised to  discuss with social worker when she is contacted.  Denies any urgent concerns at this time, contact information provided.  Advised to contact with questions.  Initial home visit scheduled for next week.  Valente David, [place], MSN [place] [phone]   She

## 2016-09-30 ENCOUNTER — Other Ambulatory Visit: Payer: Self-pay | Admitting: Internal Medicine

## 2016-09-30 ENCOUNTER — Ambulatory Visit: Payer: Medicare HMO

## 2016-09-30 DIAGNOSIS — J441 Chronic obstructive pulmonary disease with (acute) exacerbation: Secondary | ICD-10-CM

## 2016-09-30 DIAGNOSIS — E785 Hyperlipidemia, unspecified: Secondary | ICD-10-CM

## 2016-09-30 DIAGNOSIS — J449 Chronic obstructive pulmonary disease, unspecified: Secondary | ICD-10-CM

## 2016-09-30 DIAGNOSIS — Z720 Tobacco use: Secondary | ICD-10-CM

## 2016-10-01 DIAGNOSIS — J90 Pleural effusion, not elsewhere classified: Secondary | ICD-10-CM | POA: Diagnosis not present

## 2016-10-01 DIAGNOSIS — I2510 Atherosclerotic heart disease of native coronary artery without angina pectoris: Secondary | ICD-10-CM | POA: Diagnosis not present

## 2016-10-01 DIAGNOSIS — I110 Hypertensive heart disease with heart failure: Secondary | ICD-10-CM | POA: Diagnosis not present

## 2016-10-01 DIAGNOSIS — J449 Chronic obstructive pulmonary disease, unspecified: Secondary | ICD-10-CM | POA: Diagnosis not present

## 2016-10-01 DIAGNOSIS — K219 Gastro-esophageal reflux disease without esophagitis: Secondary | ICD-10-CM | POA: Diagnosis not present

## 2016-10-01 DIAGNOSIS — I272 Pulmonary hypertension, unspecified: Secondary | ICD-10-CM | POA: Diagnosis not present

## 2016-10-01 DIAGNOSIS — I5030 Unspecified diastolic (congestive) heart failure: Secondary | ICD-10-CM | POA: Diagnosis not present

## 2016-10-01 DIAGNOSIS — F329 Major depressive disorder, single episode, unspecified: Secondary | ICD-10-CM | POA: Diagnosis not present

## 2016-10-01 DIAGNOSIS — E119 Type 2 diabetes mellitus without complications: Secondary | ICD-10-CM | POA: Diagnosis not present

## 2016-10-03 ENCOUNTER — Other Ambulatory Visit: Payer: Self-pay | Admitting: Pharmacist

## 2016-10-03 NOTE — Patient Outreach (Signed)
[place] [facility]) Care Management  [place]   10/03/2016  RAYLI WIEDERHOLD 25-Jan-1956 [id number]  Subjective:  Patient was referred to [place] Pharmacist by Mannie Stabile, PharmD with PCP office.    Patient has past medical history significant for coronary artery disease, type 2 diabetes mellitus, COPD, GERD, chronic kidney disease, hyperlipidemia.    She was admitted 6/19-6/23/18 with acute respiratory failure secondary to recurrent left pleural effusion.    Successful phone outreach to patient, HIPAA details verified, and purpose of call explained to patient.  Offered a pharmacist home visit to patient and patient declined.   Patient reports she has [employer] Extra [employer] for her medications and denies medication affordability concerns.  She reports she does not use a pill box rather she just takes her medications from the bottles.    She denies questions/concerns related to her medications at time of phone call and while she declined a pharmacist home visit, she was willing to review her medications over the phone.     Objective:   Current Medications: Current Outpatient Prescriptions  Medication Sig Dispense Refill  . acetaminophen-codeine (TYLENOL #3) 300-30 MG tablet Take 1 tablet by mouth every 6 (six) hours as needed for severe pain. 20 tablet 0  . albuterol (VENTOLIN HFA) 108 (90 Base) MCG/ACT inhaler Inhale 1-2 puffs into the lungs every 6 (six) hours as needed for shortness of breath. 18 g 11  . aspirin 81 MG chewable tablet Chew 1 tablet (81 mg total) by mouth at bedtime. 90 tablet 3  . buPROPion (WELLBUTRIN SR) 150 MG 12 hr tablet Take 1 tablet (150 mg total) by mouth 2 (two) times daily. 180 tablet 0  . Fluticasone-Umeclidin-Vilant (TRELEGY ELLIPTA) 100-62.5-25 MCG/INH AEPB Inhale 1 puff into the lungs daily. 60 each 5  . furosemide (LASIX) 40 MG tablet Take 1 tablet (40 mg total) by mouth daily. 30 tablet 0  . gabapentin (NEURONTIN) 300 MG capsule Take 2 capsules (600  mg total) by mouth at bedtime. 180 capsule 3  . ipratropium-albuterol (DUONEB) 0.5-2.5 (3) MG/3ML SOLN Take 3 mLs by nebulization every 6 (six) hours as needed.    . metolazone (ZAROXOLYN) 2.5 MG tablet TAKE 1 TABLET BY MOUTH AS NEEDED(FOR 5 LBS WEIGHT GAIN) 90 tablet 3  . pantoprazole (PROTONIX) 40 MG tablet Take 1 tablet (40 mg total) by mouth daily. 90 tablet 3  . polyethylene glycol (MIRALAX / GLYCOLAX) packet Take 17 g by mouth daily as needed for moderate constipation or severe constipation. 14 each 0  . ranitidine (ZANTAC) 300 MG tablet Take 1 tablet (300 mg total) by mouth at bedtime. 90 tablet 3  . rosuvastatin (CRESTOR) 20 MG tablet Take 1 tablet (20 mg total) by mouth daily. 90 tablet 3  . senna (SENOKOT) 8.6 MG TABS tablet Take 2 tablets (17.2 mg total) by mouth daily as needed for mild constipation or moderate constipation. 120 each 0  . vitamin B-12 (CYANOCOBALAMIN) 1000 MCG tablet Take 1 tablet (1,000 mcg total) by mouth daily. 90 tablet 3   No current facility-administered medications for this visit.     Functional Status: In your present state of health, do you have any difficulty performing the following activities: 09/20/2016 09/20/2016  Hearing? - N  Vision? - N  Difficulty concentrating or making decisions? - N  Walking or climbing stairs? - Y  Dressing or bathing? - N  Doing errands, shopping? Y -  Conservation officer, nature and eating ? - -  Using the Toilet? - -  In the past six months, have you accidently leaked urine? - -  Do you have problems with loss of bowel control? - -  Managing your Medications? - -  Managing your Finances? - -  Housekeeping or managing your Housekeeping? - -  Some recent data might be hidden    Fall/Depression Screening: Fall Risk  08/05/2016 02/19/2016 11/20/2015  Falls in the past year? Yes Yes No  Number falls in past yr: 2 or more 2 or more -  Injury with Fall? No Yes -  Risk Factor Category  High Fall Risk High Fall Risk -  Risk for fall due  to : History of fall(s);Medication side effect;Other (Comment) History of fall(s);Impaired balance/gait;Impaired mobility;Medication side effect Other (Comment)  Risk for fall due to (comments): portable O2 - home O2  Follow up Falls prevention discussed Follow up appointment -   PHQ 2/9 Scores 08/05/2016 02/19/2016 11/20/2015 09/10/2015 07/07/2015 05/27/2015 05/04/2015  PHQ - 2 Score 1 0 0 0 0 1 2  PHQ- 9 Score - - - - - - -    Assessment:  Medication review per patient report, discharge summary, and medication list in this chart.  Patient was recently discharged from hospital and all medications have been reviewed.  Drugs sorted by system:  Neurologic/Psychologic: -bupropion  -gabapentin  Cardiovascular: -aspirin  -furosemide -metolazone as needed  -rosuvastatin   Pulmonary/Allergy: -albuterol inhaler as needed -fluticasone/umeclidinium/vilanterol (Trelegy)  -ipratropium/albuterol  Gastrointestinal: -pantoprazole  -Miralax as needed  -ranitinde  -senna as needed   Pain: -acetaminophen with codeine as needed  Vitamins/Minerals: -cyanocobalamin   Other issues noted:  -Patient was counseled to rinse mouth with water after each use of Trelegy to reduce risk of thrush infection.    Plan:  Will close pharmacy episode.   Will route medication review note to PCP.   Will follow-up with Mannie Stabile, PharmD who sent referral as patient declined pharmacist home visit and denies concerns with medications.   Karrie Meres, PharmD, [place] [phone]

## 2016-10-04 ENCOUNTER — Encounter: Payer: Self-pay | Admitting: Internal Medicine

## 2016-10-04 ENCOUNTER — Other Ambulatory Visit: Payer: Self-pay | Admitting: Licensed Clinical Social Worker

## 2016-10-04 ENCOUNTER — Ambulatory Visit (INDEPENDENT_AMBULATORY_CARE_PROVIDER_SITE_OTHER): Payer: Medicare HMO | Admitting: Internal Medicine

## 2016-10-04 VITALS — BP 118/75 | HR 90 | Temp 98.4°F | Ht 65.0 in | Wt 266.0 lb

## 2016-10-04 DIAGNOSIS — J9600 Acute respiratory failure, unspecified whether with hypoxia or hypercapnia: Secondary | ICD-10-CM | POA: Diagnosis not present

## 2016-10-04 DIAGNOSIS — J90 Pleural effusion, not elsewhere classified: Secondary | ICD-10-CM

## 2016-10-04 DIAGNOSIS — R0902 Hypoxemia: Secondary | ICD-10-CM | POA: Diagnosis not present

## 2016-10-04 DIAGNOSIS — E118 Type 2 diabetes mellitus with unspecified complications: Secondary | ICD-10-CM

## 2016-10-04 DIAGNOSIS — J449 Chronic obstructive pulmonary disease, unspecified: Secondary | ICD-10-CM | POA: Diagnosis not present

## 2016-10-04 DIAGNOSIS — Z801 Family history of malignant neoplasm of trachea, bronchus and lung: Secondary | ICD-10-CM | POA: Diagnosis not present

## 2016-10-04 DIAGNOSIS — Z8249 Family history of ischemic heart disease and other diseases of the circulatory system: Secondary | ICD-10-CM

## 2016-10-04 DIAGNOSIS — Z6841 Body Mass Index (BMI) 40.0 and over, adult: Secondary | ICD-10-CM

## 2016-10-04 LAB — GLUCOSE, CAPILLARY: Glucose-Capillary: 245 mg/dL — ABNORMAL HIGH (ref 65–99)

## 2016-10-04 NOTE — Progress Notes (Signed)
CC: Hospital follow up for left-sided pleural effusion status post Pleurx catheter  HPI:  [name] is a 61 y.o. woman with history noted below that presents to the acute care clinic for follow-up on her recent hospitalization for her left pleural effusion s/p Pleurx catheter. Patient states that after discharge a nurse visited to help with catheter drainage. At that time not much drainage was obtained. She is set up for nursing to come back on Saturday for repeat drainage. Patient reports breathing status has been stable since discharge and denies any shortness of breath or orthopnea.  Past Medical History:  Diagnosis Date  . Arthritis    "some scattered" (03/03/2015)  . CHF (congestive heart failure) ([place])   . Chronic bronchitis ([place])    "get it most years" (03/03/2015)  . COPD (chronic obstructive pulmonary disease) (HCC)    Severe. Gold Stage IV.  PFTs (12/2008) - severe obstructive airway disease. Active tobacco use. Requires 4L O2 at home.  . Coronary artery disease    S/P PCI of LAD with DES (12/2008). Total occlusion of RCA noted at that time., medically managed. ACS ruled out 03/2009 with Lexiscan myoview . Followed by [place].  . Depression   . Diabetes mellitus without complication ([place])   . Diastolic dysfunction    2-D Echo (12/2008) - Normal LV Systolic funciton with EF 60-65%. Grade 1 diastolid dysfunction. No regional wall motion abnormalities. Moderate pulmonary HTN with PA peak pressure 104mHg.  . Full dentures   . GERD (gastroesophageal reflux disease)    S/P Nissen fundoplication.  .Marland KitchenHistory of hiatal hernia   . Hx MRSA infection    Recurrent MRSA thigh abscesses.  . Hyperlipidemia   . Lung cancer ([place]    "left"  . Obesity   . On home oxygen therapy since 2010   4L all the time  . Pneumonia   . Prediabetes    HgbA1c 6.4 (12/2008)  . Pulmonary hypertension [place]    2-D Echo (085/6314 - Systolic pressure was moderately increased. PA peak pressure   541mg. 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 Followed by Dr. WrJoya GaskinsLVelora Heckler . Pulmonary nodule, right    Small right middle lobe nodule. Stable as of 12/2008.  . Marland Kitchenhortness of breath dyspnea    with a lot of exertion; if fluid builds up    Review of Systems:  Review of Systems  Constitutional: Negative for chills and fever.  Respiratory: Negative for cough and shortness of breath.   Cardiovascular: Negative for chest pain and palpitations.  Gastrointestinal: Negative for nausea and vomiting.  Musculoskeletal: Negative for myalgias.     Physical Exam:  Vitals:   10/04/16 1424  BP: 118/75  Pulse: 90  Temp: 98.4 F (36.9 C)  TempSrc: Oral  SpO2: 94%  Weight: 266 lb (120.7 kg)  Height: _0  (1.651 m)   Physical Exam  Constitutional:  Morbidly obese in a wheel chair  Cardiovascular: Normal rate, regular rhythm and normal heart sounds.  Exam reveals no gallop and no friction rub.   No murmur heard. Pulmonary/Chest: Effort normal and breath sounds normal. No respiratory distress. She has no wheezes. She has no rales.  Skin: Skin is warm and dry.  Left catheter in place, no signs of infection, with clean dressing in place    Assessment & Plan:   See encounters tab for problem based medical decision making.  Patient seen with Dr. Evette Doffing

## 2016-10-04 NOTE — Patient Outreach (Signed)
[facility]) Care Management  [facility] Social Work  10/04/2016  [name] 09-09-1955 [id number]  Encounter Medications:  Outpatient Encounter Prescriptions as of 10/04/2016  Medication Sig Note  . acetaminophen-codeine (TYLENOL #3) 300-30 MG tablet Take 1 tablet by mouth every 6 (six) hours as needed for severe pain. (Patient not taking: Reported on 10/04/2016)   . albuterol (VENTOLIN HFA) 108 (90 Base) MCG/ACT inhaler Inhale 1-2 puffs into the lungs every 6 (six) hours as needed for shortness of breath.   Marland Kitchen aspirin 81 MG chewable tablet Chew 1 tablet (81 mg total) by mouth at bedtime.   Marland Kitchen buPROPion (WELLBUTRIN SR) 150 MG 12 hr tablet Take 1 tablet (150 mg total) by mouth 2 (two) times daily.   . Fluticasone-Umeclidin-Vilant (TRELEGY ELLIPTA) 100-62.5-25 MCG/INH AEPB Inhale 1 puff into the lungs daily.   . furosemide (LASIX) 40 MG tablet Take 1 tablet (40 mg total) by mouth daily.   Marland Kitchen gabapentin (NEURONTIN) 300 MG capsule Take 2 capsules (600 mg total) by mouth at bedtime.   Marland Kitchen ipratropium-albuterol (DUONEB) 0.5-2.5 (3) MG/3ML SOLN Take 3 mLs by nebulization every 6 (six) hours as needed.   . metolazone (ZAROXOLYN) 2.5 MG tablet TAKE 1 TABLET BY MOUTH AS NEEDED(FOR 5 LBS WEIGHT GAIN) (Patient not taking: Reported on 10/04/2016) 09/20/2016: Not used often  . pantoprazole (PROTONIX) 40 MG tablet Take 1 tablet (40 mg total) by mouth daily.   . polyethylene glycol (MIRALAX / GLYCOLAX) packet Take 17 g by mouth daily as needed for moderate constipation or severe constipation.   . ranitidine (ZANTAC) 300 MG tablet Take 1 tablet (300 mg total) by mouth at bedtime.   . rosuvastatin (CRESTOR) 20 MG tablet Take 1 tablet (20 mg total) by mouth daily.   Marland Kitchen senna (SENOKOT) 8.6 MG TABS tablet Take 2 tablets (17.2 mg total) by mouth daily as needed for mild constipation or moderate constipation.   . vitamin B-12 (CYANOCOBALAMIN) 1000 MCG tablet Take 1 tablet (1,000 mcg total) by mouth daily.    No  facility-administered encounter medications on file as of 10/04/2016.     Functional Status:  In your present state of health, do you have any difficulty performing the following activities: 09/20/2016 09/20/2016  Hearing? - N  Vision? - N  Difficulty concentrating or making decisions? - N  Walking or climbing stairs? - Y  Dressing or bathing? - N  Doing errands, shopping? Y -  Conservation officer, nature and eating ? - -  Using the Toilet? - -  In the past six months, have you accidently leaked urine? - -  Do you have problems with loss of bowel control? - -  Managing your Medications? - -  Managing your Finances? - -  Housekeeping or managing your Housekeeping? - -  Some recent data might be hidden    Fall/Depression Screening:  PHQ 2/9 Scores 10/04/2016 08/05/2016 02/19/2016 11/20/2015 09/10/2015 07/07/2015 05/27/2015  PHQ - 2 Score 0 1 0 0 0 0 1  PHQ- 9 Score - - - - - - -    Assessment: CSW completed initial home visit on 10/04/16. Patient and grandson were present during visit. Patient is need of financial and food assistance. Patient reports that she is feeling very "sluggish" today. She denies experiencing any pain or discomfort at this time but shares that she continues to experience dizziness. Patient admits that she does not use walker or cane while walking around in her house. She reports that she has had several falls  over the past year. CSW encouraged patient to use walker to prevent having another fall. Patient reports that her support system is very close by as her grandson lives with her and her daughter lives in the apartment in front of her own.   CSW provided patient with a packet of financial resources as well as [place] and [place]. CSW spent time reviewing each resource with patient. Patient appreciative of resource information. Patient shares "I always make sure to pay my bills as soon as I get my social security check but it leaves very little for the rest of the month."  CSW provided patient with a $25.00 gift card for her to use to gain groceries. CSW activated gift card for patient. Patient denies ever applying for Food Stamps. Patient applied for Medicaid last year but was denied. Patient agreeable to CSW assisting patient with gaining food stamps. CSW completed entire food stamps application and successfully faxed it to [place] on 10/04/16. Patient very appreciative of social work support. Patient reports that she is in need of eyeglasses but cannot afford her prescription. CSW educated patient on the [employer] and their current wait list but informed patient that she will need to either have food stamps or Medicaid in order to get on the wait list. Patient is agreeable to contacting Carrolyn Leigh with DSS to be put on wait list for [employer] after she receives notification that she was approved for [employer].  THN CM Care Plan Problem One     Most Recent Value  Care Plan Problem One  Lack of community resources and ongoing financial and transportation barriers  Role Documenting the Problem One  Clinical Social Worker  Care Plan for Problem One  Active  THN CM Short Term Goal #1   Patient will gain transportation resources and understand how to set up a ride with [facility]AT services within 30 days  THN CM Short Term Goal #1 Start Date  09/29/16  Interventions for Short Term Goal #1  CSW will complete home visit and provide information on transportation resources. Patient was unsure if she was still active with SCAT and CSW completed call and confirmed that she will need to be re-evaluated for [facility]AT by 03/03/17.  THN CM Short Term Goal #2   Patient will gain financial resource education and financial assistance to assist with gaining groceries within 30 days  THN CM Short Term Goal #2 Start Date  09/29/16  Interventions for Short Term Goal #2  CSW will complete home visit within 30 days and will provide financial assistance through the [employer] in order to  assist her with gaining groceries. CSW will also provide food pantry and financial assistance education and handouts.   THN CM Short Term Goal #3  Patient will hear back from Food Stamps within 30 days  THN CM Short Term Goal #3 Start Date  10/04/16  Interventions for Short Tern Goal #3  CSW completed home visit and completed assessment and found that patient needs food assistance. Gift card was provided for groceries. CSW completed food stamps application and successfully faxed it to DSS on 10/04/16. CSW will follow up as needed.     Plan: CSW will follow up within 30 days. CSW will route encounter to PCP.  Eula Fried, BSW, MSW, [email or web address] Phone: [phone] Fax: [phone]

## 2016-10-04 NOTE — Assessment & Plan Note (Addendum)
Assessment: History of recurrent left sided pleural effusions s/p Pleurx catheter Patient was recently admitted on 6/19 for recurrent left-sided pleural effusion and had a Pleurx catheter was placed. Patient was discharged on 6/23 and has had catheter drained once with minimal output. Patient denies shortness of breath or orthopnea since discharge.  Today she is satting well on her home 4L supplemental oxygen via [place].  During hospitalization patient had pleural fluid culture that showed lactobacillus.  Discussed case with ID and recommended to repeat cultures. Unable to obtain fluid culture during outpatient visit today, as proper equipment is not available to extract fluid. Will have patient do repeat fluid culture from home health visit when catheter is drained.  Patient was offered home health PT to help with gait and strength training as patient is deconditioned from several hospitalizations in the past 6 months.  Patient was agreeable to plan.     Plan: -Home health PT - Home health pleural fluid culture

## 2016-10-04 NOTE — Patient Instructions (Signed)
[name],  Home health referral has been made for physical therapy. During your home health visit please obtain culture of your pleural fluid. Please visit the acute care clinic for any acute medical needs.

## 2016-10-06 ENCOUNTER — Encounter: Payer: Self-pay | Admitting: *Deleted

## 2016-10-06 ENCOUNTER — Other Ambulatory Visit: Payer: Self-pay | Admitting: *Deleted

## 2016-10-06 DIAGNOSIS — F329 Major depressive disorder, single episode, unspecified: Secondary | ICD-10-CM | POA: Diagnosis not present

## 2016-10-06 DIAGNOSIS — K219 Gastro-esophageal reflux disease without esophagitis: Secondary | ICD-10-CM | POA: Diagnosis not present

## 2016-10-06 DIAGNOSIS — J90 Pleural effusion, not elsewhere classified: Secondary | ICD-10-CM | POA: Diagnosis not present

## 2016-10-06 DIAGNOSIS — E119 Type 2 diabetes mellitus without complications: Secondary | ICD-10-CM | POA: Diagnosis not present

## 2016-10-06 DIAGNOSIS — I110 Hypertensive heart disease with heart failure: Secondary | ICD-10-CM | POA: Diagnosis not present

## 2016-10-06 DIAGNOSIS — J449 Chronic obstructive pulmonary disease, unspecified: Secondary | ICD-10-CM | POA: Diagnosis not present

## 2016-10-06 DIAGNOSIS — I272 Pulmonary hypertension, unspecified: Secondary | ICD-10-CM | POA: Diagnosis not present

## 2016-10-06 DIAGNOSIS — I5030 Unspecified diastolic (congestive) heart failure: Secondary | ICD-10-CM | POA: Diagnosis not present

## 2016-10-06 DIAGNOSIS — I2510 Atherosclerotic heart disease of native coronary artery without angina pectoris: Secondary | ICD-10-CM | POA: Diagnosis not present

## 2016-10-06 NOTE — Progress Notes (Signed)
Internal Medicine Clinic Attending  I saw and evaluated the patient.  I personally confirmed the key portions of the history and exam documented by Dr. [place] and I reviewed pertinent patient test results.  The assessment, diagnosis, and plan were formulated together and I agree with the documentation in the resident's note.  Left sided pleurex catheter looks well cared for by home health. Three time weekly drainage was too much, so has been readjusted to just once weekly. We want to repeat pleural fluid culture to see if the lactobacillus is real. Unfortunately we do not have the equipment in the [facility] to draw off the catheter. So we have asked the home health nurse to send a sample for culture the next time they drain it.

## 2016-10-06 NOTE — Patient Outreach (Signed)
[facility]) Care Management   10/06/2016  AVREE SZCZYGIEL 1956/01/03 [id number]  [name] is an 61 y.o. female  Subjective:   Member alert and oriented x3, denies pain, complains of shortness of breath.  She is on 4 liters nasal canula, state she has been unable to walk from one room to the other without having to stop to catch her breath.  She report taking all medications as prescribed with the exception of her nebulizer, which her insurance will not cover.  She state she has had her pleural catheter drained twice since discharge on 6/23 due to limited supplies.  She report she is waiting for supplies to be delivered today.    Objective:   Review of Systems  HENT: Negative.   Eyes: Negative.   Respiratory: Positive for shortness of breath.   Cardiovascular: Negative.   Gastrointestinal: Negative.   Genitourinary: Negative.   Musculoskeletal: Negative.   Skin: Negative.   Neurological: Positive for weakness.  Endo/Heme/Allergies: Negative.   Psychiatric/Behavioral: Negative.     Physical Exam  Constitutional: She is oriented to person, place, and time. She appears well-developed and well-nourished.  Neck: Normal range of motion.  Cardiovascular: Normal rate, regular rhythm and normal heart sounds.   Respiratory: Effort normal and breath sounds normal.  GI: Soft. Bowel sounds are normal.  Musculoskeletal: Normal range of motion.  Neurological: She is alert and oriented to person, place, and time.  Skin: Skin is warm and dry.   BP 122/70 (BP Location: Right Arm, Patient Position: Sitting, Cuff Size: Normal)   Pulse (!) 104   Resp (!) 22   SpO2 98%   Encounter Medications:   Outpatient Encounter Prescriptions as of 10/06/2016  Medication Sig Note  . albuterol (VENTOLIN HFA) 108 (90 Base) MCG/ACT inhaler Inhale 1-2 puffs into the lungs every 6 (six) hours as needed for shortness of breath.   Marland Kitchen aspirin 81 MG chewable tablet Chew 1 tablet (81 mg total) by  mouth at bedtime.   Marland Kitchen buPROPion (WELLBUTRIN SR) 150 MG 12 hr tablet Take 1 tablet (150 mg total) by mouth 2 (two) times daily.   . Fluticasone-Umeclidin-Vilant (TRELEGY ELLIPTA) 100-62.5-25 MCG/INH AEPB Inhale 1 puff into the lungs daily.   . furosemide (LASIX) 40 MG tablet Take 1 tablet (40 mg total) by mouth daily.   Marland Kitchen gabapentin (NEURONTIN) 300 MG capsule Take 2 capsules (600 mg total) by mouth at bedtime.   . pantoprazole (PROTONIX) 40 MG tablet Take 1 tablet (40 mg total) by mouth daily.   . polyethylene glycol (MIRALAX / GLYCOLAX) packet Take 17 g by mouth daily as needed for moderate constipation or severe constipation.   . ranitidine (ZANTAC) 300 MG tablet Take 1 tablet (300 mg total) by mouth at bedtime.   . rosuvastatin (CRESTOR) 20 MG tablet Take 1 tablet (20 mg total) by mouth daily.   Marland Kitchen senna (SENOKOT) 8.6 MG TABS tablet Take 2 tablets (17.2 mg total) by mouth daily as needed for mild constipation or moderate constipation.   . vitamin B-12 (CYANOCOBALAMIN) 1000 MCG tablet Take 1 tablet (1,000 mcg total) by mouth daily.   Marland Kitchen acetaminophen-codeine (TYLENOL #3) 300-30 MG tablet Take 1 tablet by mouth every 6 (six) hours as needed for severe pain. (Patient not taking: Reported on 10/06/2016)   . ipratropium-albuterol (DUONEB) 0.5-2.5 (3) MG/3ML SOLN Take 3 mLs by nebulization every 6 (six) hours as needed.   . metolazone (ZAROXOLYN) 2.5 MG tablet TAKE 1 TABLET BY MOUTH AS  NEEDED(FOR 5 LBS WEIGHT GAIN) (Patient not taking: Reported on 10/04/2016) 09/20/2016: Not used often   No facility-administered encounter medications on file as of 10/06/2016.     Functional Status:   In your present state of health, do you have any difficulty performing the following activities: 10/06/2016 10/04/2016  Hearing? N N  Vision? N N  Difficulty concentrating or making decisions? N N  Walking or climbing stairs? Y N  Dressing or bathing? N Y  Doing errands, shopping? [name]  Preparing Food and eating ? N -  Using  the Toilet? N -  In the past six months, have you accidently leaked urine? Y -  Do you have problems with loss of bowel control? N -  Managing your Medications? N -  Managing your Finances? N -  Housekeeping or managing your Housekeeping? N -  Some recent data might be hidden    Fall/Depression Screening:    Fall Risk  10/04/2016 10/04/2016 08/05/2016  Falls in the past year? Yes Yes Yes  Number falls in past yr: 2 or more 2 or more 2 or more  Injury with Fall? Yes Yes No  Risk Factor Category  High Fall Risk High Fall Risk High Fall Risk  Risk for fall due to : Impaired balance/gait Impaired balance/gait;Impaired mobility History of fall(s);Medication side effect;Other (Comment)  Risk for fall due to (comments): - - portable O2  Follow up - Falls prevention discussed Falls prevention discussed   PHQ 2/9 Scores 10/04/2016 10/04/2016 08/05/2016 02/19/2016 11/20/2015 09/10/2015 07/07/2015  PHQ - 2 Score 0 0 1 0 0 0 0  PHQ- 9 Score - - - - - - -    Assessment:    Met with member at scheduled time.  Current health condition discussed.  She report she has been in the hospital 4 times since January for shortness of breath/pleural effusion, relieved with draining.  She state she anticipated having the catheter drained 2-3 times/week, last drained last Thursday.  She has been waiting on supplies, which were delivered today during home visit.  She is advised to contact [facility] to request visit as soon as possible to drain (later this evening preferred, no later than tomorrow).    She denies he recurrent effusions being a complication of her lung cancer, however she also denies being seen by her oncologist in at least a year.  Per chart, she was scheduled in May for appointment but was a no show, and has had multiple cancellations.  She state she typically get scans every 6 months, but is also overdue for that.  She is advised to contact oncology office as soon as possible to reschedule missed  appointment.  She verbalizes understanding.  Member also has history of diabetes, hypertension, and heart failure.  She report she has "fallen off" self monitoring since being hospitalized so many times in the past several months, advised to restart and record in [facility] calendar tool book.  Appropriate diet discussed according to diabetes and heart failure management, she verbalizes understanding but also state "I love salt and if I want a country ham biscuit I'm gonna eat one."  Advised of complications of increased salt intake, re-educated on proper diet.  She denies any other concerns at this time, advised to contact with questions.  Contact information for this care manager provided.   Plan:   Will follow up next week for weekly transition of care call. Will follow up next month with routine home visit.  THN  CM Care Plan Problem One     Most Recent Value  Care Plan Problem One  Risk for readmission related to pleural effusion as evidenced by recent hospital admission  Role Documenting the Problem One  Care Management [place] for Problem One  Active  [place] Term Goal   Member will not be readmitted to the hospital within 31 days of discharge  [facility] Long Term Goal Start Date  09/29/16  Interventions for Problem One Long Term Goal  Discussed with member the importance of following discharge instructions, including follow up appointments, medications, diet, and possible health involvement, to decrease the risk of readmission  THN CM Short Term Goal #1   Member will keep and attend follow up appointment with primary MD within the next 4 weeks  THN CM Short Term Goal #1 Start Date  09/29/16  [facility] CM Short Term Goal #1 Met Date  10/06/16  Interventions for Short Term Goal #1  Educated member on importance of attending follow up appointment within 2 weeks of discharge in effort to decrease risk of readmisison.  Follow up appointment with surgeon scheduled for end of July  THN CM Short  Term Goal #2   Member will report compliance with medications over the next 4 weeks  THN CM Short Term Goal #2 Start Date  09/29/16  Interventions for Short Term Goal #2  Educated member on importance of taking medications as prescribed.  Medication list reviewed     Valente David, RN, MSN [place] [phone]

## 2016-10-09 DIAGNOSIS — I5032 Chronic diastolic (congestive) heart failure: Secondary | ICD-10-CM | POA: Diagnosis not present

## 2016-10-09 DIAGNOSIS — R0902 Hypoxemia: Secondary | ICD-10-CM | POA: Diagnosis not present

## 2016-10-09 DIAGNOSIS — R0609 Other forms of dyspnea: Secondary | ICD-10-CM | POA: Diagnosis not present

## 2016-10-09 DIAGNOSIS — J9620 Acute and chronic respiratory failure, unspecified whether with hypoxia or hypercapnia: Secondary | ICD-10-CM | POA: Diagnosis not present

## 2016-10-09 DIAGNOSIS — I509 Heart failure, unspecified: Secondary | ICD-10-CM | POA: Diagnosis not present

## 2016-10-09 DIAGNOSIS — R0602 Shortness of breath: Secondary | ICD-10-CM | POA: Diagnosis not present

## 2016-10-09 DIAGNOSIS — J449 Chronic obstructive pulmonary disease, unspecified: Secondary | ICD-10-CM | POA: Diagnosis not present

## 2016-10-10 ENCOUNTER — Encounter: Payer: Self-pay | Admitting: *Deleted

## 2016-10-10 NOTE — Addendum Note (Signed)
Addended by: Hulan Fray on: 10/10/2016 05:10 PM   Modules accepted: Orders

## 2016-10-12 ENCOUNTER — Other Ambulatory Visit: Payer: Self-pay | Admitting: Licensed Clinical Social Worker

## 2016-10-12 DIAGNOSIS — F329 Major depressive disorder, single episode, unspecified: Secondary | ICD-10-CM | POA: Diagnosis not present

## 2016-10-12 DIAGNOSIS — I272 Pulmonary hypertension, unspecified: Secondary | ICD-10-CM | POA: Diagnosis not present

## 2016-10-12 DIAGNOSIS — J449 Chronic obstructive pulmonary disease, unspecified: Secondary | ICD-10-CM | POA: Diagnosis not present

## 2016-10-12 DIAGNOSIS — J90 Pleural effusion, not elsewhere classified: Secondary | ICD-10-CM | POA: Diagnosis not present

## 2016-10-12 DIAGNOSIS — I5030 Unspecified diastolic (congestive) heart failure: Secondary | ICD-10-CM | POA: Diagnosis not present

## 2016-10-12 DIAGNOSIS — I2510 Atherosclerotic heart disease of native coronary artery without angina pectoris: Secondary | ICD-10-CM | POA: Diagnosis not present

## 2016-10-12 DIAGNOSIS — K219 Gastro-esophageal reflux disease without esophagitis: Secondary | ICD-10-CM | POA: Diagnosis not present

## 2016-10-12 DIAGNOSIS — E119 Type 2 diabetes mellitus without complications: Secondary | ICD-10-CM | POA: Diagnosis not present

## 2016-10-12 DIAGNOSIS — I110 Hypertensive heart disease with heart failure: Secondary | ICD-10-CM | POA: Diagnosis not present

## 2016-10-12 NOTE — Patient Outreach (Signed)
[facility]) Care Management  10/12/2016  IKEA DEMICCO [birth year] [id number]  Assessment- CSW completed outreach to patient and she answered. She reports that she is doing "okay." She denies having any recent falls and states that she is trying to remember to use her walker more. CSW questioned if she had heard from food stamps yet. She reports that a caseworker contacted her and informed her that she needed additional information before approving her for food stamps. Patient reports that she gathered the needed documents and that her daughter will be mailing this for her. CSW completed brief review of resources that were discussed during last home visit and reminded her that she can be put on the wait list for [employer] after she is approved for food stamps.  Plan-CSW will follow up within three weeks.  Eula Fried, BSW, MSW, [email or web address] Phone: [phone] Fax: [phone]

## 2016-10-14 ENCOUNTER — Telehealth: Payer: Self-pay | Admitting: *Deleted

## 2016-10-14 ENCOUNTER — Other Ambulatory Visit: Payer: Self-pay | Admitting: *Deleted

## 2016-10-14 ENCOUNTER — Other Ambulatory Visit: Payer: Self-pay | Admitting: Internal Medicine

## 2016-10-14 DIAGNOSIS — J449 Chronic obstructive pulmonary disease, unspecified: Secondary | ICD-10-CM

## 2016-10-14 DIAGNOSIS — K219 Gastro-esophageal reflux disease without esophagitis: Secondary | ICD-10-CM | POA: Diagnosis not present

## 2016-10-14 DIAGNOSIS — I2510 Atherosclerotic heart disease of native coronary artery without angina pectoris: Secondary | ICD-10-CM | POA: Diagnosis not present

## 2016-10-14 DIAGNOSIS — I5030 Unspecified diastolic (congestive) heart failure: Secondary | ICD-10-CM | POA: Diagnosis not present

## 2016-10-14 DIAGNOSIS — I272 Pulmonary hypertension, unspecified: Secondary | ICD-10-CM | POA: Diagnosis not present

## 2016-10-14 DIAGNOSIS — R0602 Shortness of breath: Secondary | ICD-10-CM | POA: Diagnosis not present

## 2016-10-14 DIAGNOSIS — J90 Pleural effusion, not elsewhere classified: Secondary | ICD-10-CM | POA: Diagnosis not present

## 2016-10-14 DIAGNOSIS — I110 Hypertensive heart disease with heart failure: Secondary | ICD-10-CM | POA: Diagnosis not present

## 2016-10-14 DIAGNOSIS — F329 Major depressive disorder, single episode, unspecified: Secondary | ICD-10-CM | POA: Diagnosis not present

## 2016-10-14 DIAGNOSIS — E119 Type 2 diabetes mellitus without complications: Secondary | ICD-10-CM | POA: Diagnosis not present

## 2016-10-14 MED ORDER — IPRATROPIUM-ALBUTEROL 0.5-2.5 (3) MG/3ML IN SOLN: 3.0000 mL | Freq: Four times a day (QID) | RESPIRATORY_TRACT | 3 refills | Status: DC | PRN

## 2016-10-14 NOTE — Patient Outreach (Addendum)
[facility]) Care Management  10/14/2016  [name] 05-22-1955 [id number]   Weekly transition of care call placed to member.  She report she is "doing alright."  She state she is experiencing some shortness of breath today, but report the home health nurse will make visit to drain her catheter today.  She state they have decided that she will need to drain it weekly, but the nurse will teach her how to drain it in the event that she is short of breath and in need of relief prior to the next scheduled visit by the nurse.    She denies making appointment with oncologist yet as advised, stating "I just want to get my breathing right before I go anywhere else."  She is reminded that she has follow up appointment with cardiac surgeon in the next 2 weeks, she state she will attend that appointment.  She is advised again to contact oncologist.    Member denies any further concerns at this time, will follow up next week.  Valente David, [place], MSN [place] [phone]

## 2016-10-14 NOTE — Progress Notes (Signed)
I will be on vacation starting tonight.  Thus, the labs should be routed to the ordering clinician.  It is very inappropriate to route all labs to PCP if they did not order the tests.  Please inform home health to route results to the ordering clinician.  Duonebs have been ordered and sent to the pharmacy.  If she finds herself using the bronchodilators more frequently or becomes more SOB she should seek evaluation in the ED.  Please call patient with this information.  Thank you.

## 2016-10-14 NOTE — Telephone Encounter (Signed)
[name] [id number] States pt had a temp 99.0 Using inhalers 8 to 10 times daily Has nebulizer meds but does have a machine, would you possibly want to order nebs? She was advised that dr Nils Pyle may want to be ask about this since pt has upcoming appt w/ him [facility] is doing a pallative care consult for future knowledge Little productive cough, whitish, resp rate 22, lung fields somewhat decreased before draining tube, after improved. Tube 450cc today. She also drew some labs today, possibly ordered by dr Nils Pyle but she states imc will receive results

## 2016-10-17 DIAGNOSIS — J449 Chronic obstructive pulmonary disease, unspecified: Secondary | ICD-10-CM | POA: Diagnosis not present

## 2016-10-17 DIAGNOSIS — E119 Type 2 diabetes mellitus without complications: Secondary | ICD-10-CM | POA: Diagnosis not present

## 2016-10-17 DIAGNOSIS — K219 Gastro-esophageal reflux disease without esophagitis: Secondary | ICD-10-CM | POA: Diagnosis not present

## 2016-10-17 DIAGNOSIS — I5030 Unspecified diastolic (congestive) heart failure: Secondary | ICD-10-CM | POA: Diagnosis not present

## 2016-10-17 DIAGNOSIS — I2510 Atherosclerotic heart disease of native coronary artery without angina pectoris: Secondary | ICD-10-CM | POA: Diagnosis not present

## 2016-10-17 DIAGNOSIS — I272 Pulmonary hypertension, unspecified: Secondary | ICD-10-CM | POA: Diagnosis not present

## 2016-10-17 DIAGNOSIS — F329 Major depressive disorder, single episode, unspecified: Secondary | ICD-10-CM | POA: Diagnosis not present

## 2016-10-17 DIAGNOSIS — I110 Hypertensive heart disease with heart failure: Secondary | ICD-10-CM | POA: Diagnosis not present

## 2016-10-17 DIAGNOSIS — J90 Pleural effusion, not elsewhere classified: Secondary | ICD-10-CM | POA: Diagnosis not present

## 2016-10-19 ENCOUNTER — Telehealth: Payer: Self-pay | Admitting: *Deleted

## 2016-10-19 ENCOUNTER — Other Ambulatory Visit: Payer: Self-pay | Admitting: Licensed Clinical Social Worker

## 2016-10-19 DIAGNOSIS — I5030 Unspecified diastolic (congestive) heart failure: Secondary | ICD-10-CM | POA: Diagnosis not present

## 2016-10-19 DIAGNOSIS — J449 Chronic obstructive pulmonary disease, unspecified: Secondary | ICD-10-CM | POA: Diagnosis not present

## 2016-10-19 DIAGNOSIS — J90 Pleural effusion, not elsewhere classified: Secondary | ICD-10-CM | POA: Diagnosis not present

## 2016-10-19 DIAGNOSIS — I2510 Atherosclerotic heart disease of native coronary artery without angina pectoris: Secondary | ICD-10-CM | POA: Diagnosis not present

## 2016-10-19 DIAGNOSIS — I272 Pulmonary hypertension, unspecified: Secondary | ICD-10-CM | POA: Diagnosis not present

## 2016-10-19 DIAGNOSIS — K219 Gastro-esophageal reflux disease without esophagitis: Secondary | ICD-10-CM | POA: Diagnosis not present

## 2016-10-19 DIAGNOSIS — I110 Hypertensive heart disease with heart failure: Secondary | ICD-10-CM | POA: Diagnosis not present

## 2016-10-19 DIAGNOSIS — F329 Major depressive disorder, single episode, unspecified: Secondary | ICD-10-CM | POA: Diagnosis not present

## 2016-10-19 DIAGNOSIS — E119 Type 2 diabetes mellitus without complications: Secondary | ICD-10-CM | POA: Diagnosis not present

## 2016-10-19 NOTE — Telephone Encounter (Signed)
[facility] CSW calls and states they have spoken to pt and she has agreed to a pallative care consult.

## 2016-10-19 NOTE — Patient Outreach (Signed)
[facility] [facility]) Care Management  10/19/2016  [name] [birth year] [id number]  Assessment- CSW received incoming call from [place] the social worker at [facility]. She reports that they are following patient's case and that they completed a referral for patient to [place]. However, patient's PCP is out of the office until 10/31/16 and nurse does not feel comfortable completing palliative care order at this time. CSW and Jenny Reichmann are wondering if another physician could review her case and complete order as patient's PCP will not be able to make palliative care order until 11 days. CSW will send in basket message to PCP's nurse to question this.  Plan-CSW will await to hear back from PCP's nurse and will coordinate care with CSW [facility] with [facility] as needed.  Eula Fried, BSW, MSW, [place].[email or web address] Phone: [phone] Fax: [phone]

## 2016-10-19 NOTE — Telephone Encounter (Signed)
Spoke w/ HHN, tracey, she is going to work w/ pt today with the duonebs to evaluate how they are going. She was also ask to speak w/ dr Nils Pyle r/t pallative care issues.

## 2016-10-20 DIAGNOSIS — K219 Gastro-esophageal reflux disease without esophagitis: Secondary | ICD-10-CM | POA: Diagnosis not present

## 2016-10-20 DIAGNOSIS — I2510 Atherosclerotic heart disease of native coronary artery without angina pectoris: Secondary | ICD-10-CM | POA: Diagnosis not present

## 2016-10-20 DIAGNOSIS — F329 Major depressive disorder, single episode, unspecified: Secondary | ICD-10-CM | POA: Diagnosis not present

## 2016-10-20 DIAGNOSIS — I110 Hypertensive heart disease with heart failure: Secondary | ICD-10-CM | POA: Diagnosis not present

## 2016-10-20 DIAGNOSIS — I272 Pulmonary hypertension, unspecified: Secondary | ICD-10-CM | POA: Diagnosis not present

## 2016-10-20 DIAGNOSIS — J90 Pleural effusion, not elsewhere classified: Secondary | ICD-10-CM | POA: Diagnosis not present

## 2016-10-20 DIAGNOSIS — I5030 Unspecified diastolic (congestive) heart failure: Secondary | ICD-10-CM | POA: Diagnosis not present

## 2016-10-20 DIAGNOSIS — J449 Chronic obstructive pulmonary disease, unspecified: Secondary | ICD-10-CM | POA: Diagnosis not present

## 2016-10-20 DIAGNOSIS — E119 Type 2 diabetes mellitus without complications: Secondary | ICD-10-CM | POA: Diagnosis not present

## 2016-10-20 NOTE — Telephone Encounter (Signed)
This has been addressed refer to other notes

## 2016-10-21 ENCOUNTER — Other Ambulatory Visit: Payer: Self-pay | Admitting: Pharmacist

## 2016-10-21 ENCOUNTER — Other Ambulatory Visit: Payer: Self-pay | Admitting: *Deleted

## 2016-10-21 DIAGNOSIS — I110 Hypertensive heart disease with heart failure: Secondary | ICD-10-CM | POA: Diagnosis not present

## 2016-10-21 DIAGNOSIS — I5030 Unspecified diastolic (congestive) heart failure: Secondary | ICD-10-CM | POA: Diagnosis not present

## 2016-10-21 DIAGNOSIS — J90 Pleural effusion, not elsewhere classified: Secondary | ICD-10-CM | POA: Diagnosis not present

## 2016-10-21 DIAGNOSIS — I2510 Atherosclerotic heart disease of native coronary artery without angina pectoris: Secondary | ICD-10-CM | POA: Diagnosis not present

## 2016-10-21 DIAGNOSIS — F329 Major depressive disorder, single episode, unspecified: Secondary | ICD-10-CM | POA: Diagnosis not present

## 2016-10-21 DIAGNOSIS — K219 Gastro-esophageal reflux disease without esophagitis: Secondary | ICD-10-CM | POA: Diagnosis not present

## 2016-10-21 DIAGNOSIS — I272 Pulmonary hypertension, unspecified: Secondary | ICD-10-CM | POA: Diagnosis not present

## 2016-10-21 DIAGNOSIS — J449 Chronic obstructive pulmonary disease, unspecified: Secondary | ICD-10-CM | POA: Diagnosis not present

## 2016-10-21 DIAGNOSIS — E119 Type 2 diabetes mellitus without complications: Secondary | ICD-10-CM | POA: Diagnosis not present

## 2016-10-21 NOTE — Patient Outreach (Signed)
[facility]) Care Management  10/21/2016  [name] 1956/03/29 [id number]  Received in basket message from [place], patient was interested in a pharmacy that offers delivery.   Successful phone outreach to patient, HIPAA details verified.   Patient reports she is looking into changing her prescriptions to [facility] due to location to her and delivery option.  She reports at this time she doesn't have any questions/concerns related to her medications.  [facility] Pharmacist has previously spoken with patient to complete a medication review.   Plan:  Will not open pharmacy episode as patient denies needs.  She states she plans to get medications at a pharmacy that offers delivery and has one in mind.   She is aware she can contact [facility] Pharmacist if new pharmacy needs arise.   Karrie Meres, PharmD, [place] [phone]

## 2016-10-21 NOTE — Patient Outreach (Signed)
[facility]) Care Management  10/21/2016  [name] 08/30/1955 [id number]   Noted in chart that home health nurse, Olivia Mackie, contacted primary MD office regarding palliative care request.  Call placed to [facility] to collaborate on plan of care.  She state that she has had difficulty managing member with pleural catheter as she is not willing to learn how to drain independently and has no desire at this time to manager other chronic conditions (heart failure, COPD, and diabetes).  She state primary MD is out of town at this time, office will follow up with request upon his return.  Call then placed to member for weekly transition of care call.  She state she is doing "alright" but continue to complain of shortness of breath.  She report that Olivia Mackie will be visiting today to drain catheter, draining 300-500 three times/week.  She state she does not have anyone that is can learn how to drain.  She state her daughter works and her grandson will not Psychologist, occupational.  She state "if they (home health) can't come and do it then they might as well take it out."  She is advised of complications of recurrent fluid build up, including death if catheter removed.  She verbalizes understanding, stating "well I don't have any other choice."   Palliative care discussed, she state she is not willing to make a decision until she speak with Dr. Eppie Gibson.  She is advised again to make appointment with oncologist, she state she will after she has seen Dr. Eppie Gibson.  She has appointment with Dr. Nils Pyle next week, will discuss plan with him.  This care manager will follow up with member next week, advised to consider palliative care.  Valente David, [place], MSN [place] [phone]

## 2016-10-22 IMAGING — CR DG CHEST 2V
2 series · 2 of 2 positions shown · non-contrast
Comparison: February 22, 2014.

CLINICAL DATA: Shortness of breath.

EXAM:
CHEST  2 VIEW

[chest pa]
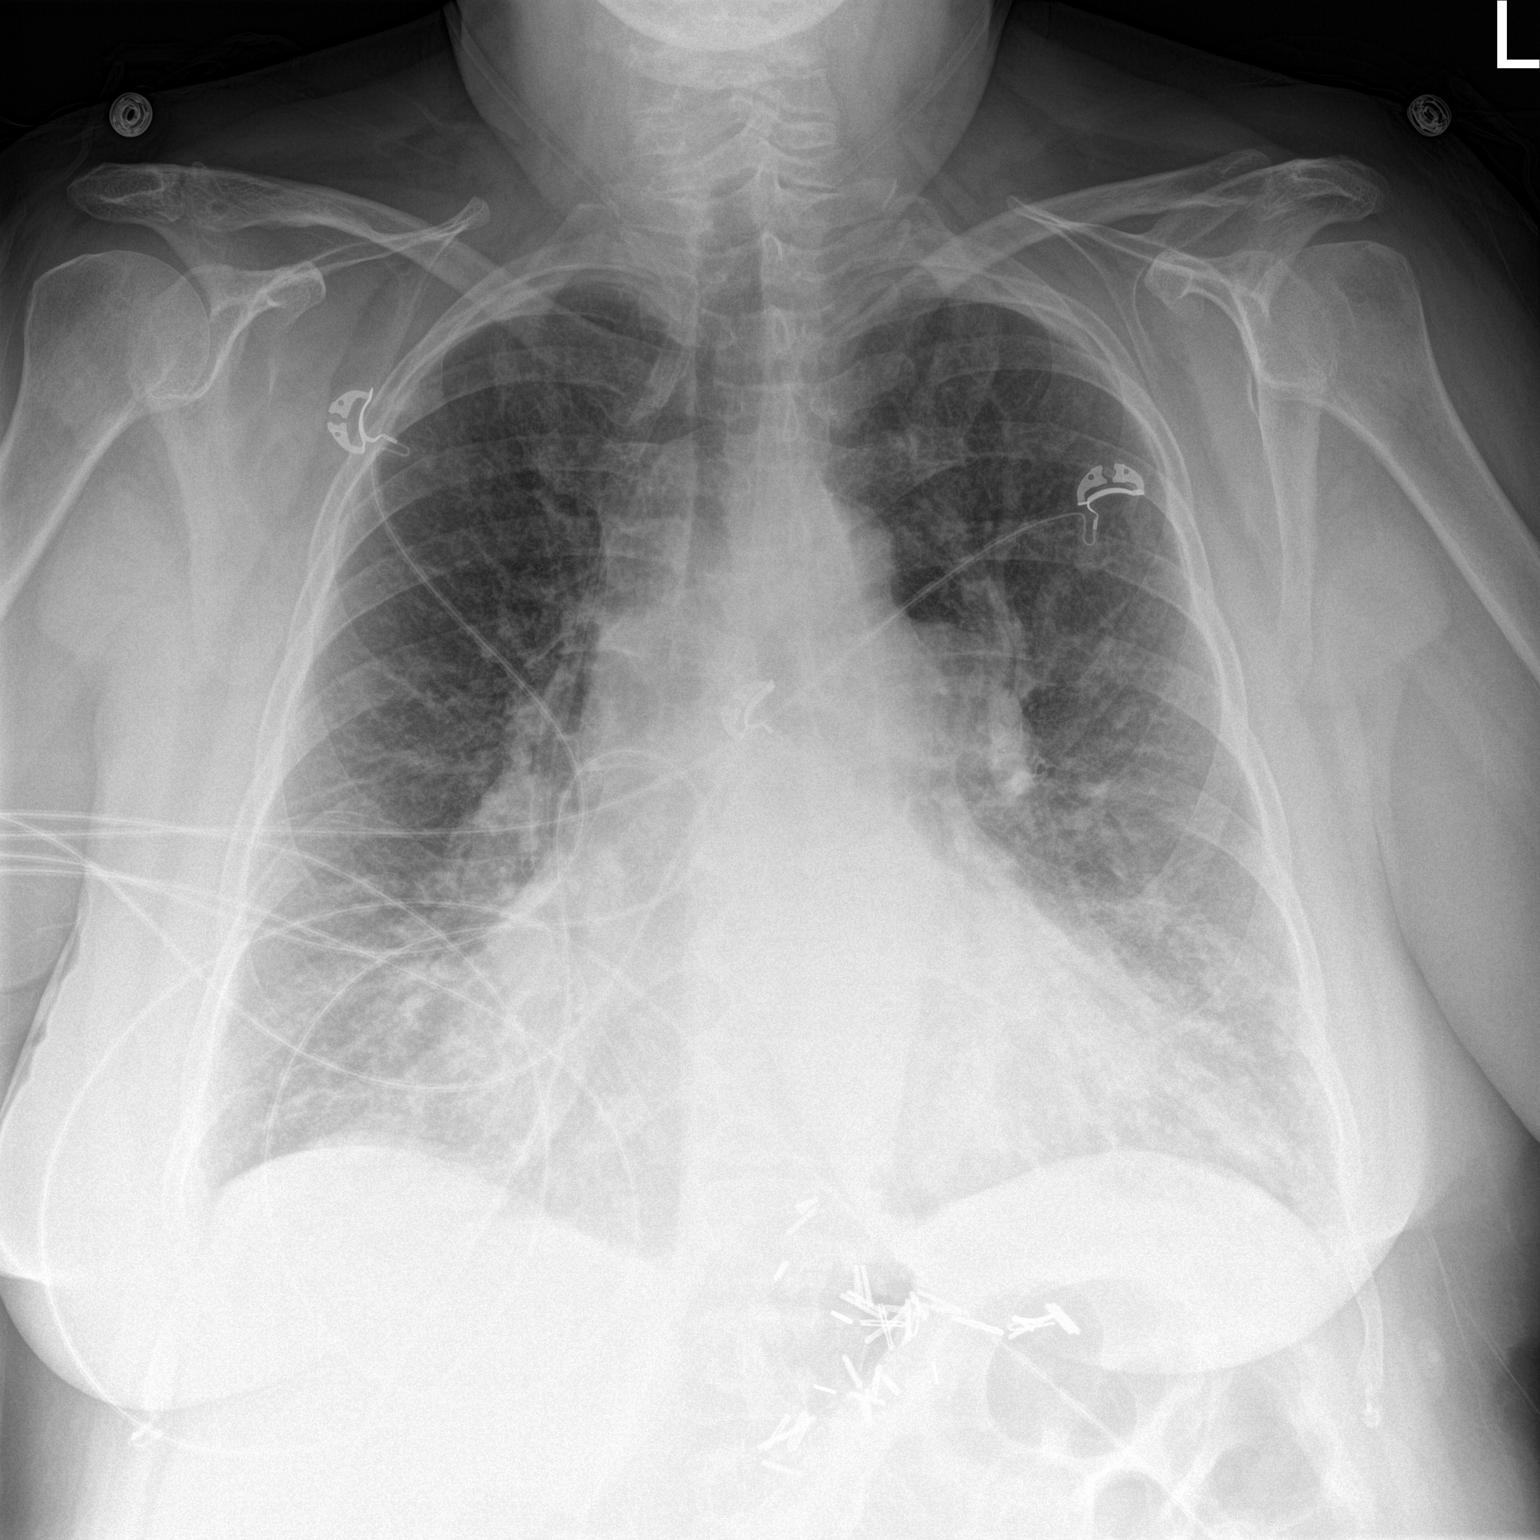

[chest lat]
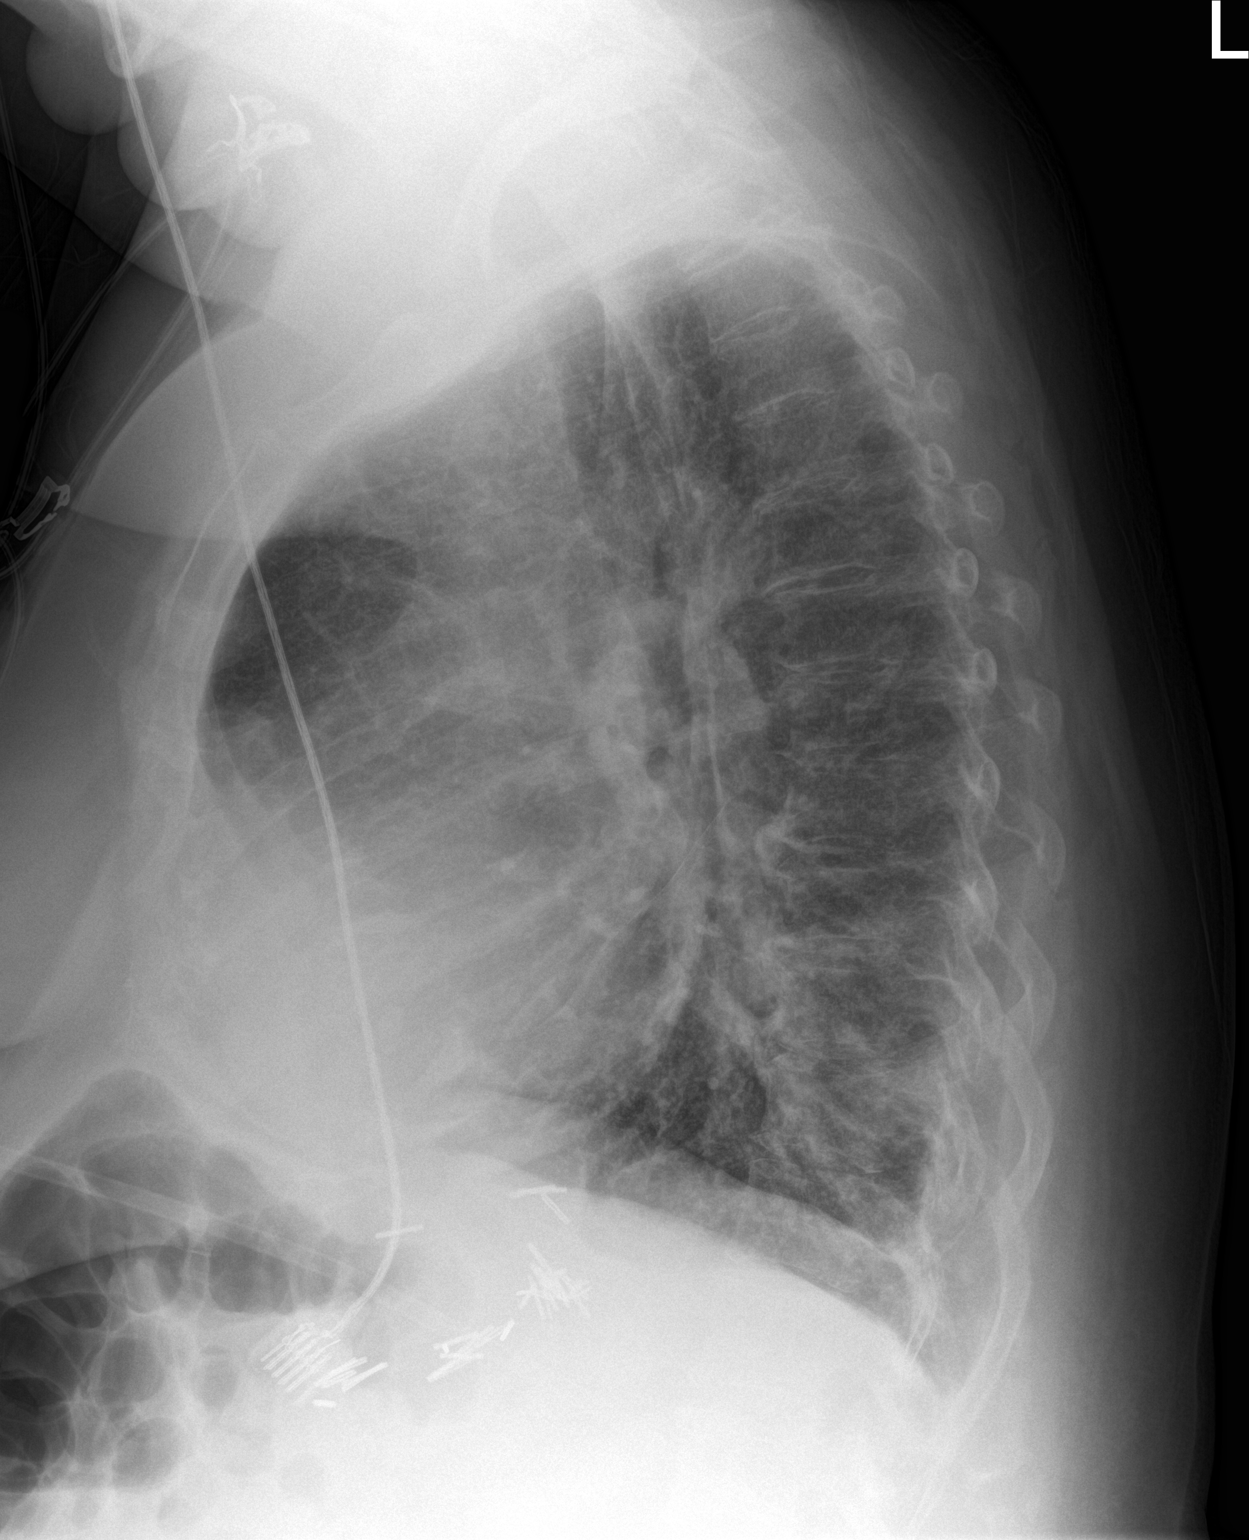

[2 of 2 positions shown; findings below may reference images not displayed]

FINDINGS: Stable cardiomegaly. Stable mild central pulmonary vascular
congestion is noted. No pneumothorax or significant pleural effusion
is noted. Stable bibasilar interstitial densities are noted most
consistent with scarring, but superimposed pulmonary edema cannot be
excluded. Bony thorax appears intact.
IMPRESSION: Stable bibasilar interstitial densities are noted most consistent
with scarring, but superimposed pulmonary edema cannot be excluded.
Stable mild central pulmonary vascular congestion is noted.

## 2016-10-24 ENCOUNTER — Other Ambulatory Visit: Payer: Self-pay | Admitting: Internal Medicine

## 2016-10-24 ENCOUNTER — Telehealth: Payer: Self-pay | Admitting: Medical Oncology

## 2016-10-24 DIAGNOSIS — I272 Pulmonary hypertension, unspecified: Secondary | ICD-10-CM | POA: Diagnosis not present

## 2016-10-24 DIAGNOSIS — I5030 Unspecified diastolic (congestive) heart failure: Secondary | ICD-10-CM | POA: Diagnosis not present

## 2016-10-24 DIAGNOSIS — F329 Major depressive disorder, single episode, unspecified: Secondary | ICD-10-CM | POA: Diagnosis not present

## 2016-10-24 DIAGNOSIS — J449 Chronic obstructive pulmonary disease, unspecified: Secondary | ICD-10-CM | POA: Diagnosis not present

## 2016-10-24 DIAGNOSIS — E119 Type 2 diabetes mellitus without complications: Secondary | ICD-10-CM | POA: Diagnosis not present

## 2016-10-24 DIAGNOSIS — K219 Gastro-esophageal reflux disease without esophagitis: Secondary | ICD-10-CM | POA: Diagnosis not present

## 2016-10-24 DIAGNOSIS — J90 Pleural effusion, not elsewhere classified: Secondary | ICD-10-CM | POA: Diagnosis not present

## 2016-10-24 DIAGNOSIS — I110 Hypertensive heart disease with heart failure: Secondary | ICD-10-CM | POA: Diagnosis not present

## 2016-10-24 DIAGNOSIS — I2510 Atherosclerotic heart disease of native coronary artery without angina pectoris: Secondary | ICD-10-CM | POA: Diagnosis not present

## 2016-10-24 NOTE — Telephone Encounter (Signed)
In 2-3 weeks when I have availabiliy

## 2016-10-24 NOTE — Progress Notes (Signed)
Order placed based on recommendation of CSW and discussion with patient.

## 2016-10-24 NOTE — Telephone Encounter (Signed)
requests to r/s appts -"missed scan and last two appts because I was in hospital".Recurrant pleural effusion -neg malignancy

## 2016-10-24 NOTE — Progress Notes (Signed)
Since her last visit with PCP, [name] has had 2 more hospitalizations and now has a pleur-x catheter in place for treatment of a chronic pleural effusion.  I do not see that she has followed up with Oncology based on Dr. Caroline More last recommendation.    I called and spoke with [name]  She reports that she is interested in this referral and would like to see how her chronic lung disease can be better symptomatically managed with help from the Palliative Care team.  She noted that she has not seen Dr. Earlie Server in Oncology yet because she has to space her appointments out as it costs $45 to see a specialist.  She hopes to be able to follow up with Oncology next month.    Referral to Palliative care of [place] will be placed.  Patient is amenable to this referral.

## 2016-10-25 ENCOUNTER — Encounter: Payer: Self-pay | Admitting: *Deleted

## 2016-10-25 ENCOUNTER — Other Ambulatory Visit: Payer: Self-pay | Admitting: Cardiothoracic Surgery

## 2016-10-25 ENCOUNTER — Other Ambulatory Visit: Payer: Self-pay | Admitting: Licensed Clinical Social Worker

## 2016-10-25 ENCOUNTER — Telehealth: Payer: Self-pay | Admitting: Internal Medicine

## 2016-10-25 DIAGNOSIS — C3490 Malignant neoplasm of unspecified part of unspecified bronchus or lung: Secondary | ICD-10-CM

## 2016-10-25 NOTE — Progress Notes (Signed)
Oncology Nurse Navigator Documentation  Oncology Nurse Navigator Flowsheets 10/25/2016  Navigator Location [facility]  Navigator Encounter Type Other/per Dr. Julien Nordmann, I notified scheduling to call patient and scheduled an appt in 3 weeks for a follow up with him.   Treatment Phase Other  Barriers/Navigation Needs Coordination of Care  Interventions Other  Acuity Level 1  Time Spent with Patient 15

## 2016-10-25 NOTE — Patient Outreach (Signed)
[place] [facility]) Care Management  10/25/2016  [name] 12/31/1955 [id number]   Assessment- CSW received notification that Palliative referral would be placed. CSW completed call to patient and she answered. She reports "palliative care is suppose to be meeting with me tomorrow." CSW questioned if she had heard from food stamps and she declined. She shares that she has not received a call or anything in the mail. CSW scheduled home visit for 10/27/16 in order to follow up on food stamps and make referral to [employer] for her.  Plan-CSW will complete home visit this week.  [name], BSW, MSW, [name] Phone: [phone] Fax: [phone]

## 2016-10-25 NOTE — Telephone Encounter (Signed)
Scheduled appt per 7/24 sch message - patient is aware of appt time and date.

## 2016-10-26 ENCOUNTER — Ambulatory Visit (INDEPENDENT_AMBULATORY_CARE_PROVIDER_SITE_OTHER): Payer: Medicare HMO | Admitting: Cardiothoracic Surgery

## 2016-10-26 ENCOUNTER — Telehealth: Payer: Self-pay | Admitting: *Deleted

## 2016-10-26 ENCOUNTER — Ambulatory Visit
Admission: RE | Admit: 2016-10-26 | Discharge: 2016-10-26 | Disposition: A | Discharge status: Home / Self Care | Payer: Medicare HMO | Source: Ambulatory Visit | Attending: Cardiothoracic Surgery | Admitting: Cardiothoracic Surgery

## 2016-10-26 VITALS — BP 105/63 | HR 95 | Resp 20 | Ht 65.0 in | Wt 265.0 lb

## 2016-10-26 DIAGNOSIS — J90 Pleural effusion, not elsewhere classified: Secondary | ICD-10-CM | POA: Diagnosis not present

## 2016-10-26 DIAGNOSIS — C3490 Malignant neoplasm of unspecified part of unspecified bronchus or lung: Secondary | ICD-10-CM

## 2016-10-26 DIAGNOSIS — C3412 Malignant neoplasm of upper lobe, left bronchus or lung: Secondary | ICD-10-CM | POA: Diagnosis not present

## 2016-10-26 DIAGNOSIS — R531 Weakness: Secondary | ICD-10-CM | POA: Diagnosis not present

## 2016-10-26 NOTE — Progress Notes (Signed)
PCP is Oval Linsey, MD Referring Provider is Oval Linsey, MD  Chief Complaint  Patient presents with  . Pleural Effusion    3 week f/u with CXR, left Pleural effusion, drained 200 cc's from PleurX today, she is scheduled to see [facility] on 11/24/16   Status post left Pleurx catheter June 23 for recurrent post radiation pleural effusion cytology negative HPI: 1 month follow-up after left Pleurx catheter 61 year old obese female on home oxygen with heart failure and radiation therapy for lung cancer 1.5 years ago Pleurx drainage is approximately 300 cc Monday Wednesday Friday schedule Chest x-ray shows some loculated effusion-atelectasis at left base prior to drainage today  Plan to reduce drainage schedule to Mondays and Thursdays We'll follow-up in 6 weeks with chest x-ray Incisions are clean and suture around catheter removed today  Past Medical History:  Diagnosis Date  . Arthritis    "some scattered" (03/03/2015)  . CHF (congestive heart failure) ([place])   . Chronic bronchitis ([place])    "get it most years" (03/03/2015)  . COPD (chronic obstructive pulmonary disease) (H[facility])    Severe. Gold Stage IV.  PFTs (12/2008) - severe obstructive airway disease. Active tobacco use. Requires 4L O2 at home.  . Coronary artery disease    S/P PCI of LAD with DES (12/2008). Total occlusion of RCA noted at that time., medically managed. ACS ruled out 03/2009 with Lexiscan myoview . Followed by [place] [facility].  . Depression   . Diabetes mellitus without complication ([place])   . Diastolic dysfunction    2-D Echo (12/2008) - Normal LV Systolic funciton with EF 60-65%. Grade 1 diastolid dysfunction. No regional wall motion abnormalities. Moderate pulmonary HTN with PA peak pressure 68mHg.  . Full dentures   . GERD (gastroesophageal reflux disease)    S/P Nissen fundoplication.  .Marland KitchenHistory of hiatal hernia   . Hx MRSA infection    Recurrent MRSA thigh abscesses.  . Hyperlipidemia   . Lung cancer ([place]    "left"  . Obesity   . On home oxygen therapy since 2010   4L all the time  . Pneumonia   . Prediabetes    HgbA1c 6.4 (12/2008)  . Pulmonary hypertension ([place]    2-D Echo (030/0923 - Systolic pressure was moderately increased. PA peak pressure  532mg. 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 Followed by Dr. WrJoya GaskinsLVelora Heckler . Pulmonary nodule, right    Small right middle lobe nodule. Stable as of 12/2008.  . Marland Kitchenhortness of breath dyspnea    with a lot of exertion; if fluid builds up    Past Surgical History:  Procedure Laterality Date  . ABDOMINAL HYSTERECTOMY  1980's   "still have my cervix"  . APPENDECTOMY    . BACK SURGERY    . BILATERAL OOPHORECTOMY  1979  . BREAST LUMPECTOMY Right 1990's    (biopsy negative)  . CHEST TUBE INSERTION Left 09/23/2016   Procedure: INSERTION LEFT PLEURAL DRAINAGE CATHETER;  Surgeon: VaIvin PootMD;  [facility] Service: Thoracic;  Laterality: Left;  . CORONARY ANGIOPLASTY WITH STENT PLACEMENT  2010   MCEthelRCA  . HERNIA REPAIR    . IR THORACENTESIS ASP PLEURAL SPACE W/IMG GUIDE  07/18/2016  . IR THORACENTESIS ASP PLEURAL SPACE W/IMG GUIDE  08/24/2016  . KNEE ARTHROSCOPY Right ~ 2000  . LUAjoURGERY  2001   "herniated discs[facility]"  . NISSEN FUNDOPLICATION    . RIGHT HEART CATHETERIZATION N/A 11/09/2012   Procedure: RIGHT HEART CATH;  Surgeon: Larey Dresser, MD;  Location: [facility] CATH LAB;  Service: Cardiovascular;  Laterality: N/A;  . TONSILLECTOMY    . TUBAL LIGATION  1979  . VIDEO BRONCHOSCOPY WITH ENDOBRONCHIAL ULTRASOUND N/A 12/15/2014   Procedure: VIDEO BRONCHOSCOPY WITH ENDOBRONCHIAL ULTRASOUND;  Surgeon: Ivin Poot, MD;  Location: [facility] OR;  Service: Thoracic;  Laterality: N/A;    Family History  Problem Relation Age of Onset  . Heart disease Mother 54       Deceased from MI at 18yo  .  Hypertension Mother   . Heart disease Father 39       Deceased of MI age 68yo  . Hypertension Father   . Hypertension Brother   . Lung cancer Unknown        Grandmother    Social History Social History  Substance Use Topics  . Smoking status: Former Smoker    Packs/day: 1.00    Years: 45.00    Types: Cigarettes    Start date: 04/04/1969    Quit date: 11/04/2015  . Smokeless tobacco: Never Used     Comment: 1 cig per day  . Alcohol use No    Current Outpatient Prescriptions  Medication Sig Dispense Refill  . acetaminophen-codeine (TYLENOL #3) 300-30 MG tablet Take 1 tablet by mouth every 6 (six) hours as needed for severe pain. 20 tablet 0  . albuterol (VENTOLIN HFA) 108 (90 Base) MCG/ACT inhaler Inhale 1-2 puffs into the lungs every 6 (six) hours as needed for shortness of breath. 18 g 11  . aspirin 81 MG chewable tablet Chew 1 tablet (81 mg total) by mouth at bedtime. 90 tablet 3  . buPROPion (WELLBUTRIN SR) 150 MG 12 hr tablet Take 1 tablet (150 mg total) by mouth 2 (two) times daily. 180 tablet 0  . Fluticasone-Umeclidin-Vilant (TRELEGY ELLIPTA) 100-62.5-25 MCG/INH AEPB Inhale 1 puff into the lungs daily. 60 each 5  . furosemide (LASIX) 40 MG tablet Take 1 tablet (40 mg total) by mouth daily. 30 tablet 0  . gabapentin (NEURONTIN) 300 MG capsule Take 2 capsules (600 mg total) by mouth at bedtime. 180 capsule 3  . ipratropium-albuterol (DUONEB) 0.5-2.5 (3) MG/3ML SOLN USE 3 ML VIA NEBULIZER EVERY 6 HOURS AS NEEDED FOR SHORTNESS OF BREATH 3240 mL 3  . metolazone (ZAROXOLYN) 2.5 MG tablet TAKE 1 TABLET BY MOUTH AS NEEDED(FOR 5 LBS WEIGHT GAIN) 90 tablet 3  . pantoprazole (PROTONIX) 40 MG tablet Take 1 tablet (40 mg total) by mouth daily. 90 tablet 3  . polyethylene glycol (MIRALAX / GLYCOLAX) packet Take 17 g by mouth daily as needed for moderate constipation or severe constipation. 14 each 0  . ranitidine (ZANTAC) 300 MG tablet Take 1 tablet (300 mg total) by mouth at bedtime. 90  tablet 3  . rosuvastatin (CRESTOR) 20 MG tablet Take 1 tablet (20 mg total) by mouth daily. 90 tablet 3  . senna (SENOKOT) 8.6 MG TABS tablet Take 2 tablets (17.2 mg total) by mouth daily as needed for mild constipation or moderate constipation. 120 each 0  . vitamin B-12 (CYANOCOBALAMIN) 1000 MCG tablet Take 1 tablet (1,000 mcg total) by mouth daily. 90 tablet 3   No current facility-administered medications for this visit.     Allergies  Allergen Reactions  . Fluconazole Anaphylaxis and Itching  . Atorvastatin Other (See Comments)    Dizziness  Review of Systems   Shortness of breath No fever No drainage from catheter site  BP 105/63   Pulse 95   Resp 20   Ht _0  (1.651 m)   Wt 265 lb (120.2 kg)   SpO2 94% Comment: 4L O2 per [place]  BMI 44.10 kg/m  Physical Exam      Exam    General- alert and comfortable   Lungs- diminished breath sounds left base without rales, wheezes   Cor- regular rate and rhythm, no murmur , gallop   Abdomen- soft, non-tender   Extremities - warm, non-tender, minimal edema   Neuro- oriented, appropriate, no focal weakness   Diagnostic Tests: Chest x-ray personally reviewed showing atelectasis left base with probable small effusion  Impression: Continue left thorax catheter for persistent left pleural effusion secondary to inflammation from radiation therapy  Plan: Return with chest x-ray and review of progress in 6 weeks   Len Childs, MD [facility] [phone]

## 2016-10-26 NOTE — Telephone Encounter (Signed)
pallative care calls and states that NP saw pt today and suggested an SSRI for depression, pallative care rep was informed pt is on wellbutrin sr159m take 1 twice daily, she states they do not have pt's med list, [facility] and call back with any questions, she was agreeable

## 2016-10-27 ENCOUNTER — Other Ambulatory Visit: Payer: Self-pay | Admitting: *Deleted

## 2016-10-27 ENCOUNTER — Other Ambulatory Visit: Payer: Self-pay | Admitting: Licensed Clinical Social Worker

## 2016-10-27 ENCOUNTER — Telehealth: Payer: Self-pay

## 2016-10-27 DIAGNOSIS — I2510 Atherosclerotic heart disease of native coronary artery without angina pectoris: Secondary | ICD-10-CM | POA: Diagnosis not present

## 2016-10-27 DIAGNOSIS — K219 Gastro-esophageal reflux disease without esophagitis: Secondary | ICD-10-CM | POA: Diagnosis not present

## 2016-10-27 DIAGNOSIS — I110 Hypertensive heart disease with heart failure: Secondary | ICD-10-CM | POA: Diagnosis not present

## 2016-10-27 DIAGNOSIS — I5030 Unspecified diastolic (congestive) heart failure: Secondary | ICD-10-CM | POA: Diagnosis not present

## 2016-10-27 DIAGNOSIS — J90 Pleural effusion, not elsewhere classified: Secondary | ICD-10-CM | POA: Diagnosis not present

## 2016-10-27 DIAGNOSIS — J449 Chronic obstructive pulmonary disease, unspecified: Secondary | ICD-10-CM | POA: Diagnosis not present

## 2016-10-27 DIAGNOSIS — I272 Pulmonary hypertension, unspecified: Secondary | ICD-10-CM | POA: Diagnosis not present

## 2016-10-27 DIAGNOSIS — E119 Type 2 diabetes mellitus without complications: Secondary | ICD-10-CM | POA: Diagnosis not present

## 2016-10-27 DIAGNOSIS — F329 Major depressive disorder, single episode, unspecified: Secondary | ICD-10-CM | POA: Diagnosis not present

## 2016-10-27 NOTE — Patient Outreach (Signed)
Request received from Eula Fried, LCSW to mail patient personal care resources.  Information mailed today.

## 2016-10-27 NOTE — Telephone Encounter (Signed)
Drain PleurX on Mondays and Thursdays. Appt scheduled with Dr Earlie Server on 11/24/16 and Dr Prescott Gum on 12/27/16.

## 2016-10-27 NOTE — Patient Outreach (Signed)
[facility]) Care Management  10/27/2016  [name] 04-05-1955 [id number]   Weekly transition of care call placed to member.  She state she is doing "about the same."  She confirms that she did attend visit with cardiothoracic surgeon for assessment of catheter.  Plan is to drain catheter twice a week over the next several weeks, with the intent to decrease frequency if drainage decrease.  She report that she still remains unable to drain independently, relies on home health nurse, next visit today.  She report compliance with medications, but denies checking blood sugar, blood pressure or weights.  She state she will wait to check blood sugar today because she had some cinnamon sugar croissants.  She also verbalize that she is aware that this does not adhere to her diet.  This care manager inquires about contact with palliative care.  She state she did have home visit from them, think she is involved but is not sure.  Made aware that this care manager will contact their office to confirm.  She is aware that once she is involved with them, [facility] will close case dut to duplication of services.  She verbalizes understanding.  Routine home visit scheduled for next week.  If involved with palliative care, will cancel visit and close case.  If not, will proceed with home visit.  Valente David, [place], MSN [place] [phone]

## 2016-10-27 NOTE — Patient Outreach (Signed)
[place] [facility]) Care Management  [facility] Social Work  10/27/2016  [name] 1956/04/03 [id number]  Encounter Medications:  Outpatient Encounter Prescriptions as of 10/27/2016  Medication Sig Note  . acetaminophen-codeine (TYLENOL #3) 300-30 MG tablet Take 1 tablet by mouth every 6 (six) hours as needed for severe pain.   Marland Kitchen albuterol (VENTOLIN HFA) 108 (90 Base) MCG/ACT inhaler Inhale 1-2 puffs into the lungs every 6 (six) hours as needed for shortness of breath.   Marland Kitchen aspirin 81 MG chewable tablet Chew 1 tablet (81 mg total) by mouth at bedtime.   Marland Kitchen buPROPion (WELLBUTRIN SR) 150 MG 12 hr tablet Take 1 tablet (150 mg total) by mouth 2 (two) times daily.   . Fluticasone-Umeclidin-Vilant (TRELEGY ELLIPTA) 100-62.5-25 MCG/INH AEPB Inhale 1 puff into the lungs daily.   . furosemide (LASIX) 40 MG tablet Take 1 tablet (40 mg total) by mouth daily.   Marland Kitchen gabapentin (NEURONTIN) 300 MG capsule Take 2 capsules (600 mg total) by mouth at bedtime.   Marland Kitchen ipratropium-albuterol (DUONEB) 0.5-2.5 (3) MG/3ML SOLN USE 3 ML VIA NEBULIZER EVERY 6 HOURS AS NEEDED FOR SHORTNESS OF BREATH   . metolazone (ZAROXOLYN) 2.5 MG tablet TAKE 1 TABLET BY MOUTH AS NEEDED(FOR 5 LBS WEIGHT GAIN) 09/20/2016: Not used often  . pantoprazole (PROTONIX) 40 MG tablet Take 1 tablet (40 mg total) by mouth daily.   . polyethylene glycol (MIRALAX / GLYCOLAX) packet Take 17 g by mouth daily as needed for moderate constipation or severe constipation.   . ranitidine (ZANTAC) 300 MG tablet Take 1 tablet (300 mg total) by mouth at bedtime.   . rosuvastatin (CRESTOR) 20 MG tablet Take 1 tablet (20 mg total) by mouth daily.   Marland Kitchen senna (SENOKOT) 8.6 MG TABS tablet Take 2 tablets (17.2 mg total) by mouth daily as needed for mild constipation or moderate constipation.   . vitamin B-12 (CYANOCOBALAMIN) 1000 MCG tablet Take 1 tablet (1,000 mcg total) by mouth daily.    No facility-administered encounter medications on file as of 10/27/2016.      Functional Status:  In your present state of health, do you have any difficulty performing the following activities: 10/06/2016 10/04/2016  Hearing? N N  Vision? N N  Difficulty concentrating or making decisions? N N  Walking or climbing stairs? Y N  Dressing or bathing? N Y  Doing errands, shopping? [name]  Preparing Food and eating ? N -  Using the Toilet? N -  In the past six months, have you accidently leaked urine? Y -  Do you have problems with loss of bowel control? N -  Managing your Medications? N -  Managing your Finances? N -  Housekeeping or managing your Housekeeping? N -  Some recent data might be hidden    Fall/Depression Screening:  PHQ 2/9 Scores 10/04/2016 10/04/2016 08/05/2016 02/19/2016 11/20/2015 09/10/2015 07/07/2015  PHQ - 2 Score 0 0 1 0 0 0 0  PHQ- 9 Score - - - - - - -    Assessment: CSW completed routine home visit on 10/27/16. Patient reports that [name] came to her residence yesterday. Patient states "I think I'm going to get it." CSW provided education on palliative care and their program. She reports that "4 people were here yesterday which got overwhelming." CSW informed her that [name] will eventually discharge her once it is confirmed that she is active with palliative care services in order to not duplicate services. Patient expressed understanding. Patient shares that she  is a little confused about who all she has talked to over the past few weeks stating "It get's complicated because I can't keep up with all of the agencies." Patient reports that a social worker came to her home and assisted her with applying for Adult Medicaid. She is unable to state the name of the social worker but believes it is [name] from [facility]. CSW questioned if she had heard from food stamps and she declined. CSW completed call to DSS and spoke to a representative that stated that her application is pending. CSW was informed that there was a request placed for additional  information. Patient states "I'm not sure if my daughter mailed out that information yet. I forgot to ask her." CSW unable to call daughter at this time as she is currently working. However, patient is agreeable to check with daughter and make sure that the requested information was provided to DSS in order to complete food stamps application. Patient is unsure if she wishes for CSW to put her on the wait list for the [employer]. CSW informed her that if she changes her mind then she can call and refer herself at anytime. CSW provided contact information for her to keep. Patient shares that she continues to deal with SOB on a daily basis. She shares that PT will start today at 3:00 pm but she is nervous she will not have the energy to participate like last time. Emotional support provided during session. Patient requested dental resource information for her son who resides with her. CSW reviewed dental resources and informed her that she will mail her out a copy as well.  Plan: CSW will follow up within two weeks and continue to provide social work support and assistance.  Eula Fried, BSW, MSW, [email or web address] Phone: [phone] Fax: [phone]

## 2016-10-28 ENCOUNTER — Telehealth: Payer: Self-pay | Admitting: Internal Medicine

## 2016-10-28 ENCOUNTER — Telehealth: Payer: Self-pay | Admitting: *Deleted

## 2016-10-28 DIAGNOSIS — I5030 Unspecified diastolic (congestive) heart failure: Secondary | ICD-10-CM | POA: Diagnosis not present

## 2016-10-28 DIAGNOSIS — F329 Major depressive disorder, single episode, unspecified: Secondary | ICD-10-CM | POA: Diagnosis not present

## 2016-10-28 DIAGNOSIS — I2510 Atherosclerotic heart disease of native coronary artery without angina pectoris: Secondary | ICD-10-CM | POA: Diagnosis not present

## 2016-10-28 DIAGNOSIS — K219 Gastro-esophageal reflux disease without esophagitis: Secondary | ICD-10-CM | POA: Diagnosis not present

## 2016-10-28 DIAGNOSIS — J449 Chronic obstructive pulmonary disease, unspecified: Secondary | ICD-10-CM | POA: Diagnosis not present

## 2016-10-28 DIAGNOSIS — E119 Type 2 diabetes mellitus without complications: Secondary | ICD-10-CM | POA: Diagnosis not present

## 2016-10-28 DIAGNOSIS — I110 Hypertensive heart disease with heart failure: Secondary | ICD-10-CM | POA: Diagnosis not present

## 2016-10-28 DIAGNOSIS — I272 Pulmonary hypertension, unspecified: Secondary | ICD-10-CM | POA: Diagnosis not present

## 2016-10-28 DIAGNOSIS — J90 Pleural effusion, not elsewhere classified: Secondary | ICD-10-CM | POA: Diagnosis not present

## 2016-10-28 NOTE — Telephone Encounter (Signed)
It is unusual for her blood sugar to be that high. We should offer an appointment with [facility] first thing Monday morning as long as she is eating, drinking, and feeling ok. If her symptoms are severe, like fevers, vomiting, abdominal pain, chest pain, etc, she should be seen at an emergency department.

## 2016-10-28 NOTE — Telephone Encounter (Signed)
She is drinking & eating well & feeling ok. She needs 3 days at least to set up transportation. She will call Monday to set up with [facility]. She said she's feeling ok & denies severe symptoms. Advised to go to ED if starts to feeling worse. Verbalized understanding.

## 2016-10-28 NOTE — Telephone Encounter (Signed)
[facility] NURSE Reporting High Blood Sugar Readings for patient .  Please call back.

## 2016-10-28 NOTE — Telephone Encounter (Signed)
Patient reports blood sugars 384, 387 & has been above 200 today. Feeling tired, shaky. Was on metformin in the past. pls advise!

## 2016-10-31 ENCOUNTER — Telehealth: Payer: Self-pay

## 2016-10-31 DIAGNOSIS — E119 Type 2 diabetes mellitus without complications: Secondary | ICD-10-CM | POA: Diagnosis not present

## 2016-10-31 DIAGNOSIS — I5030 Unspecified diastolic (congestive) heart failure: Secondary | ICD-10-CM | POA: Diagnosis not present

## 2016-10-31 DIAGNOSIS — I272 Pulmonary hypertension, unspecified: Secondary | ICD-10-CM | POA: Diagnosis not present

## 2016-10-31 DIAGNOSIS — K219 Gastro-esophageal reflux disease without esophagitis: Secondary | ICD-10-CM | POA: Diagnosis not present

## 2016-10-31 DIAGNOSIS — I2510 Atherosclerotic heart disease of native coronary artery without angina pectoris: Secondary | ICD-10-CM | POA: Diagnosis not present

## 2016-10-31 DIAGNOSIS — F329 Major depressive disorder, single episode, unspecified: Secondary | ICD-10-CM | POA: Diagnosis not present

## 2016-10-31 DIAGNOSIS — J90 Pleural effusion, not elsewhere classified: Secondary | ICD-10-CM | POA: Diagnosis not present

## 2016-10-31 DIAGNOSIS — J449 Chronic obstructive pulmonary disease, unspecified: Secondary | ICD-10-CM | POA: Diagnosis not present

## 2016-10-31 DIAGNOSIS — I110 Hypertensive heart disease with heart failure: Secondary | ICD-10-CM | POA: Diagnosis not present

## 2016-10-31 NOTE — Telephone Encounter (Signed)
[name] with [facility] is in the patient home, requesting the nurse to call back.

## 2016-11-01 ENCOUNTER — Other Ambulatory Visit: Payer: Self-pay | Admitting: *Deleted

## 2016-11-01 ENCOUNTER — Other Ambulatory Visit: Payer: Self-pay | Admitting: Licensed Clinical Social Worker

## 2016-11-01 NOTE — Telephone Encounter (Signed)
Rtc, lm for rtc 

## 2016-11-01 NOTE — Telephone Encounter (Signed)
Call from [facility] with [facility] returning call-triage nurse on another line at this time, will leave message that she called.Despina Hidden Cassady7/31/201812:07 PM

## 2016-11-01 NOTE — Telephone Encounter (Signed)
Called and lm for rtc

## 2016-11-01 NOTE — Patient Outreach (Signed)
[name]) Care Management  11/01/2016  [name] 07/26/1955 [id number]  Assessment- CSW received incoming call from [name] THN RNCM contacted two different Hospice and Palliative Care agencies to confirm that patient is enrolled in their program them but both agencies declined that patient was active with their services. Patient has difficulty remembering different agencies that are involved in her care but seems certain that 4 different palliative care staff members came to complete their assessment last week. [facility] RNCM is agreeable to follow up with provider who completed order for pallaitive care.  Plan-CSW will continue to provide social work services as it has not been confirmed that patient is active with Palliative care at this time.  [name], BSW, MSW, [name] Phone: [phone] Fax: [phone]

## 2016-11-01 NOTE — Patient Outreach (Signed)
[facility]) Care Management  11/01/2016  [name] Aug 15, 1955 [id number]   Call placed to [place] as member previously stated she had assessment by their nurses for involvement.  They have no record of member being active with their program.  Call then placed to [facility] the [place] to inquire about involvement.  They too report that member is not listed as active.  LCSW contacted, however she is not aware of who referral was placed with.  Call then placed to [facility] with [place] for clarification, but she is on vacation until next week.  This care manager will proceed with home visit later this week, will follow up with primary MD office after visit.  Valente David, MSN, RN [facility] Care Management  [facility] Manager [phone]

## 2016-11-03 ENCOUNTER — Other Ambulatory Visit: Payer: Self-pay | Admitting: *Deleted

## 2016-11-03 DIAGNOSIS — I5030 Unspecified diastolic (congestive) heart failure: Secondary | ICD-10-CM | POA: Diagnosis not present

## 2016-11-03 DIAGNOSIS — I110 Hypertensive heart disease with heart failure: Secondary | ICD-10-CM | POA: Diagnosis not present

## 2016-11-03 DIAGNOSIS — I2510 Atherosclerotic heart disease of native coronary artery without angina pectoris: Secondary | ICD-10-CM | POA: Diagnosis not present

## 2016-11-03 DIAGNOSIS — F329 Major depressive disorder, single episode, unspecified: Secondary | ICD-10-CM | POA: Diagnosis not present

## 2016-11-03 DIAGNOSIS — J449 Chronic obstructive pulmonary disease, unspecified: Secondary | ICD-10-CM | POA: Diagnosis not present

## 2016-11-03 DIAGNOSIS — I272 Pulmonary hypertension, unspecified: Secondary | ICD-10-CM | POA: Diagnosis not present

## 2016-11-03 DIAGNOSIS — J90 Pleural effusion, not elsewhere classified: Secondary | ICD-10-CM | POA: Diagnosis not present

## 2016-11-03 DIAGNOSIS — E119 Type 2 diabetes mellitus without complications: Secondary | ICD-10-CM | POA: Diagnosis not present

## 2016-11-03 DIAGNOSIS — K219 Gastro-esophageal reflux disease without esophagitis: Secondary | ICD-10-CM | POA: Diagnosis not present

## 2016-11-03 NOTE — Patient Outreach (Signed)
[facility]) Care Management   11/03/2016  [name] 1956-02-21 [id number]  [name] is an 61 y.o. female  Subjective:   Member alert & oriented x3, denies pain but does continue to complain of shortness of breath.  She state she has been able to improve on some of her strength through PT, last session earlier this morning.  She report compliance with medications, including nebulizer as needed.  She has not been compliant with diet, nor has she been compliant with monitoring her blood sugar, blood pressure and weights.  Objective:   Review of Systems  HENT: Negative.   Eyes: Negative.   Respiratory: Positive for shortness of breath.   Cardiovascular: Negative.   Gastrointestinal: Negative.   Genitourinary: Negative.   Musculoskeletal: Negative.   Skin: Negative.   Neurological: Positive for weakness.  Endo/Heme/Allergies: Negative.   Psychiatric/Behavioral: Negative.     Physical Exam  Constitutional: She is oriented to person, place, and time. She appears well-developed and well-nourished.  Neck: Normal range of motion.  Cardiovascular: Normal rate, regular rhythm and normal heart sounds.   Respiratory: Effort normal.  Left side diminished  GI: Soft. Bowel sounds are normal.  Musculoskeletal: Normal range of motion.  Neurological: She is alert and oriented to person, place, and time.  Skin: Skin is warm and dry.   BP 132/74 (BP Location: Right Arm, Patient Position: Sitting, Cuff Size: Normal)   Pulse 98   Resp (!) 22   SpO2 93%   Encounter Medications:   Outpatient Encounter Prescriptions as of 11/03/2016  Medication Sig Note  . acetaminophen-codeine (TYLENOL #3) 300-30 MG tablet Take 1 tablet by mouth every 6 (six) hours as needed for severe pain.   Marland Kitchen albuterol (VENTOLIN HFA) 108 (90 Base) MCG/ACT inhaler Inhale 1-2 puffs into the lungs every 6 (six) hours as needed for shortness of breath.   Marland Kitchen aspirin 81 MG chewable tablet Chew 1 tablet (81 mg  total) by mouth at bedtime.   Marland Kitchen buPROPion (WELLBUTRIN SR) 150 MG 12 hr tablet Take 1 tablet (150 mg total) by mouth 2 (two) times daily.   . Fluticasone-Umeclidin-Vilant (TRELEGY ELLIPTA) 100-62.5-25 MCG/INH AEPB Inhale 1 puff into the lungs daily.   Marland Kitchen gabapentin (NEURONTIN) 300 MG capsule Take 2 capsules (600 mg total) by mouth at bedtime.   Marland Kitchen ipratropium-albuterol (DUONEB) 0.5-2.5 (3) MG/3ML SOLN USE 3 ML VIA NEBULIZER EVERY 6 HOURS AS NEEDED FOR SHORTNESS OF BREATH   . metolazone (ZAROXOLYN) 2.5 MG tablet TAKE 1 TABLET BY MOUTH AS NEEDED(FOR 5 LBS WEIGHT GAIN) 09/20/2016: Not used often  . pantoprazole (PROTONIX) 40 MG tablet Take 1 tablet (40 mg total) by mouth daily.   . polyethylene glycol (MIRALAX / GLYCOLAX) packet Take 17 g by mouth daily as needed for moderate constipation or severe constipation.   . ranitidine (ZANTAC) 300 MG tablet Take 1 tablet (300 mg total) by mouth at bedtime.   . rosuvastatin (CRESTOR) 20 MG tablet Take 1 tablet (20 mg total) by mouth daily.   Marland Kitchen senna (SENOKOT) 8.6 MG TABS tablet Take 2 tablets (17.2 mg total) by mouth daily as needed for mild constipation or moderate constipation.   . vitamin B-12 (CYANOCOBALAMIN) 1000 MCG tablet Take 1 tablet (1,000 mcg total) by mouth daily.   . furosemide (LASIX) 40 MG tablet Take 1 tablet (40 mg total) by mouth daily.    No facility-administered encounter medications on file as of 11/03/2016.     Functional Status:   In  your present state of health, do you have any difficulty performing the following activities: 10/06/2016 10/04/2016  Hearing? N N  Vision? N N  Difficulty concentrating or making decisions? N N  Walking or climbing stairs? Y N  Comment shortness of breath -  Dressing or bathing? N Y  Comment - AT [facility]  Doing errands, shopping? [name]  Preparing Food and eating ? N -  Using the Toilet? N -  In the past six months, have you accidently leaked urine? Y -  Do you have problems with loss of bowel  control? N -  Managing your Medications? N -  Managing your Finances? N -  Housekeeping or managing your Housekeeping? N -  Some recent data might be hidden    Fall/Depression Screening:    Fall Risk  10/04/2016 10/04/2016 08/05/2016  Falls in the past year? Yes Yes Yes  Number falls in past yr: 2 or more 2 or more 2 or more  Comment - - -  Injury with Fall? Yes Yes No  Comment - - -  Risk Factor Category  High Fall Risk High Fall Risk High Fall Risk  Risk for fall due to : Impaired balance/gait Impaired balance/gait;Impaired mobility History of fall(s);Medication side effect;Other (Comment)  Risk for fall due to: Comment - - portable O2  Follow up - Falls prevention discussed Falls prevention discussed   PHQ 2/9 Scores 10/04/2016 10/04/2016 08/05/2016 02/19/2016 11/20/2015 09/10/2015 07/07/2015  PHQ - 2 Score 0 0 1 0 0 0 0  PHQ- 9 Score - - - - - - -    Assessment:    Met with member at scheduled time.  Nurse from [facility] [facility] arrives during visit to drain catheter.  She (member) report that catheter was drained on Monday, approximately 200 ml removed.  She state again that the plan is to remove drain once weekly drainage reaches </= 50 ml.  Member remains unreceptive to receive teaching to be able to drain independently.    She remains receptive to palliative care involvement.  She state she had visit in July from palliative care team, has paperwork from [place].  Member aware that she may not be fully active with them due to delay of order as her primary MD has been out of town.  She is advised to contact office to follow up with MD, made aware that this care manager will also contact office to clarify.    She denies questions at this time, advised to contact with concerns.  Plan:   Will follow up with primary MD office regarding palliative care involvement. Will follow up with member after contact with MD office.  If member determined to be active with  palliative care, will close case at that time.  THN CM Care Plan Problem One     Most Recent Value  Care Plan Problem One  Risk for readmission related to pleural effusion as evidenced by recent hospital admission  Role Documenting the Problem One  Care Management [place] for Problem One  Not Active  [facility] Long Term Goal   Member will not be readmitted to the hospital within 31 days of discharge  [facility] Long Term Goal Start Date  09/29/16  [facility] Long Term Goal Met Date  11/03/16  Interventions for Problem One Long Term Goal  Discussed with member the importance of following discharge instructions, including follow up appointments, medications, diet, and possible health involvement, to  decrease the risk of readmission  [facility] CM Short Term Goal #1   Member will keep and attend follow up appointment with primary MD within the next 4 weeks  [facility] CM Short Term Goal #1 Start Date  09/29/16  [facility] CM Short Term Goal #1 Met Date  10/06/16  Interventions for Short Term Goal #1  Educated member on importance of attending follow up appointment within 2 weeks of discharge in effort to decrease risk of readmisison.  Follow up appointment with surgeon scheduled for end of July  [facility]N CM Short Term Goal #2   Member will report compliance with medications over the next 4 weeks  [facility]N CM Short Term Goal #2 Start Date  09/29/16  [facility] CM Short Term Goal #2 Met Date  10/27/16  Interventions for Short Term Goal #2  Educated member on importance of taking medications as prescribed.  Medication list reviewed     Valente David, RN, MSN [place] [phone]

## 2016-11-04 DIAGNOSIS — J9600 Acute respiratory failure, unspecified whether with hypoxia or hypercapnia: Secondary | ICD-10-CM | POA: Diagnosis not present

## 2016-11-04 DIAGNOSIS — J449 Chronic obstructive pulmonary disease, unspecified: Secondary | ICD-10-CM | POA: Diagnosis not present

## 2016-11-04 DIAGNOSIS — R0902 Hypoxemia: Secondary | ICD-10-CM | POA: Diagnosis not present

## 2016-11-07 ENCOUNTER — Other Ambulatory Visit: Payer: Self-pay | Admitting: Licensed Clinical Social Worker

## 2016-11-07 DIAGNOSIS — J449 Chronic obstructive pulmonary disease, unspecified: Secondary | ICD-10-CM | POA: Diagnosis not present

## 2016-11-07 DIAGNOSIS — J90 Pleural effusion, not elsewhere classified: Secondary | ICD-10-CM | POA: Diagnosis not present

## 2016-11-07 DIAGNOSIS — I2510 Atherosclerotic heart disease of native coronary artery without angina pectoris: Secondary | ICD-10-CM | POA: Diagnosis not present

## 2016-11-07 DIAGNOSIS — E119 Type 2 diabetes mellitus without complications: Secondary | ICD-10-CM | POA: Diagnosis not present

## 2016-11-07 DIAGNOSIS — I5030 Unspecified diastolic (congestive) heart failure: Secondary | ICD-10-CM | POA: Diagnosis not present

## 2016-11-07 DIAGNOSIS — I272 Pulmonary hypertension, unspecified: Secondary | ICD-10-CM | POA: Diagnosis not present

## 2016-11-07 DIAGNOSIS — I110 Hypertensive heart disease with heart failure: Secondary | ICD-10-CM | POA: Diagnosis not present

## 2016-11-07 DIAGNOSIS — F329 Major depressive disorder, single episode, unspecified: Secondary | ICD-10-CM | POA: Diagnosis not present

## 2016-11-07 DIAGNOSIS — K219 Gastro-esophageal reflux disease without esophagitis: Secondary | ICD-10-CM | POA: Diagnosis not present

## 2016-11-07 NOTE — Patient Outreach (Signed)
[facility]) Care Management  11/07/2016  [name] 1955-07-05 [id number]  Assessment- CSW completed call to [place] and discussed case. [facility] [place] did visit patient but no physician order has been signed yet. RNCM is agreeable to contacting PCP's nurse. CSW completed call to patient and she answered. CSW encouraged patient to schedule PCP appointment as she has not visited him since going to the hospital. Patient reports that she will schedule PCP appointment. CSW questioned if she had heard back from Food stamps yet. Patient shares that she found out that her daughter did not drop off the requested documents to DSS but instead gave documents to her grandson. However, grandson reports that daughter did not give him requested documents either. CSW questioned if there would be anyway that she could gather documents again and provide to DSS. Patient reports that she would prefer to start a brand new food stamps application. CSW will agreeable to assist with this but informed her that she will need to gather the needed information first. Patient agreeable.  Plan-CSW will follow up within two weeks to schedule home visit.  Eula Fried, BSW, MSW, [email or web address] Phone: [phone] Fax: [phone]

## 2016-11-08 ENCOUNTER — Telehealth: Payer: Self-pay

## 2016-11-08 DIAGNOSIS — I110 Hypertensive heart disease with heart failure: Secondary | ICD-10-CM | POA: Diagnosis not present

## 2016-11-08 DIAGNOSIS — F329 Major depressive disorder, single episode, unspecified: Secondary | ICD-10-CM | POA: Diagnosis not present

## 2016-11-08 DIAGNOSIS — J449 Chronic obstructive pulmonary disease, unspecified: Secondary | ICD-10-CM | POA: Diagnosis not present

## 2016-11-08 DIAGNOSIS — E119 Type 2 diabetes mellitus without complications: Secondary | ICD-10-CM | POA: Diagnosis not present

## 2016-11-08 DIAGNOSIS — I5030 Unspecified diastolic (congestive) heart failure: Secondary | ICD-10-CM | POA: Diagnosis not present

## 2016-11-08 DIAGNOSIS — K219 Gastro-esophageal reflux disease without esophagitis: Secondary | ICD-10-CM | POA: Diagnosis not present

## 2016-11-08 DIAGNOSIS — I272 Pulmonary hypertension, unspecified: Secondary | ICD-10-CM | POA: Diagnosis not present

## 2016-11-08 DIAGNOSIS — J90 Pleural effusion, not elsewhere classified: Secondary | ICD-10-CM | POA: Diagnosis not present

## 2016-11-08 DIAGNOSIS — I2510 Atherosclerotic heart disease of native coronary artery without angina pectoris: Secondary | ICD-10-CM | POA: Diagnosis not present

## 2016-11-08 NOTE — Telephone Encounter (Signed)
[name] with [facility] want to reported pt BS is still high, non compliance with diet. Told to schedule appt with PCP, pt decline to schedule appt.

## 2016-11-08 NOTE — Telephone Encounter (Signed)
Pt is schedule to see Dr Eppie Gibson 12/23/2016.

## 2016-11-09 ENCOUNTER — Other Ambulatory Visit: Payer: Self-pay | Admitting: Licensed Clinical Social Worker

## 2016-11-09 ENCOUNTER — Telehealth: Payer: Self-pay

## 2016-11-09 DIAGNOSIS — I509 Heart failure, unspecified: Secondary | ICD-10-CM | POA: Diagnosis not present

## 2016-11-09 DIAGNOSIS — R0609 Other forms of dyspnea: Secondary | ICD-10-CM | POA: Diagnosis not present

## 2016-11-09 DIAGNOSIS — R0602 Shortness of breath: Secondary | ICD-10-CM | POA: Diagnosis not present

## 2016-11-09 DIAGNOSIS — R0902 Hypoxemia: Secondary | ICD-10-CM | POA: Diagnosis not present

## 2016-11-09 DIAGNOSIS — J9620 Acute and chronic respiratory failure, unspecified whether with hypoxia or hypercapnia: Secondary | ICD-10-CM | POA: Diagnosis not present

## 2016-11-09 DIAGNOSIS — I5032 Chronic diastolic (congestive) heart failure: Secondary | ICD-10-CM | POA: Diagnosis not present

## 2016-11-09 DIAGNOSIS — J449 Chronic obstructive pulmonary disease, unspecified: Secondary | ICD-10-CM | POA: Diagnosis not present

## 2016-11-09 NOTE — Telephone Encounter (Signed)
Informed pt that [place] would pay her copay to [facility]

## 2016-11-09 NOTE — Telephone Encounter (Signed)
Asking to speak with Bonnita Nasuti. Please call pt back.

## 2016-11-09 NOTE — Patient Outreach (Signed)
[facility]) Care Management  11/09/2016  [name] 06/23/1955 [id number]  Assessment- CSW received message from PCP's nurse Freddy Finner stating that she is trying to reach patient as she has $45.00 for patient to pick up for [place] appointment. CSW completed call to patient and she answered. CSW updated her and encouraged her to contact nurse by today. Patient is agreeable to do this. Patient wanted to inform CSW that she ended up being approved for food stamps and received her first EBT card yesterday. She reports that she will be getting $80.00 in food stamps per month and wanted to thank CSW for help.  Plan-CSW send in basket message update to Freddy Finner. CSW will continue to provide social work support and coordinate care as needed.  Eula Fried, BSW, MSW, [email or web address] Phone: [phone] Fax: [phone]

## 2016-11-10 DIAGNOSIS — I5030 Unspecified diastolic (congestive) heart failure: Secondary | ICD-10-CM | POA: Diagnosis not present

## 2016-11-10 DIAGNOSIS — J90 Pleural effusion, not elsewhere classified: Secondary | ICD-10-CM | POA: Diagnosis not present

## 2016-11-10 DIAGNOSIS — K219 Gastro-esophageal reflux disease without esophagitis: Secondary | ICD-10-CM | POA: Diagnosis not present

## 2016-11-10 DIAGNOSIS — F329 Major depressive disorder, single episode, unspecified: Secondary | ICD-10-CM | POA: Diagnosis not present

## 2016-11-10 DIAGNOSIS — E119 Type 2 diabetes mellitus without complications: Secondary | ICD-10-CM | POA: Diagnosis not present

## 2016-11-10 DIAGNOSIS — I110 Hypertensive heart disease with heart failure: Secondary | ICD-10-CM | POA: Diagnosis not present

## 2016-11-10 DIAGNOSIS — I272 Pulmonary hypertension, unspecified: Secondary | ICD-10-CM | POA: Diagnosis not present

## 2016-11-10 DIAGNOSIS — J449 Chronic obstructive pulmonary disease, unspecified: Secondary | ICD-10-CM | POA: Diagnosis not present

## 2016-11-10 DIAGNOSIS — I2510 Atherosclerotic heart disease of native coronary artery without angina pectoris: Secondary | ICD-10-CM | POA: Diagnosis not present

## 2016-11-14 ENCOUNTER — Inpatient Hospital Stay (HOSPITAL_COMMUNITY)
Admission: EM | Admit: 2016-11-14 | Discharge: 2016-11-18 | DRG: 190 | Disposition: A | Discharge status: Home Health | Payer: Medicare HMO | Attending: Student in an Organized Health Care Education/Training Program | Admitting: Student in an Organized Health Care Education/Training Program

## 2016-11-14 ENCOUNTER — Emergency Department (HOSPITAL_COMMUNITY): Payer: Medicare HMO

## 2016-11-14 ENCOUNTER — Encounter (HOSPITAL_COMMUNITY): Payer: Self-pay | Admitting: Emergency Medicine

## 2016-11-14 DIAGNOSIS — R0902 Hypoxemia: Secondary | ICD-10-CM | POA: Diagnosis not present

## 2016-11-14 DIAGNOSIS — J9601 Acute respiratory failure with hypoxia: Secondary | ICD-10-CM | POA: Diagnosis not present

## 2016-11-14 DIAGNOSIS — K219 Gastro-esophageal reflux disease without esophagitis: Secondary | ICD-10-CM | POA: Diagnosis not present

## 2016-11-14 DIAGNOSIS — Z90710 Acquired absence of both cervix and uterus: Secondary | ICD-10-CM

## 2016-11-14 DIAGNOSIS — I129 Hypertensive chronic kidney disease with stage 1 through stage 4 chronic kidney disease, or unspecified chronic kidney disease: Secondary | ICD-10-CM | POA: Diagnosis present

## 2016-11-14 DIAGNOSIS — R918 Other nonspecific abnormal finding of lung field: Secondary | ICD-10-CM | POA: Diagnosis not present

## 2016-11-14 DIAGNOSIS — I2582 Chronic total occlusion of coronary artery: Secondary | ICD-10-CM | POA: Diagnosis present

## 2016-11-14 DIAGNOSIS — I2510 Atherosclerotic heart disease of native coronary artery without angina pectoris: Secondary | ICD-10-CM | POA: Diagnosis not present

## 2016-11-14 DIAGNOSIS — R0602 Shortness of breath: Secondary | ICD-10-CM | POA: Diagnosis not present

## 2016-11-14 DIAGNOSIS — J441 Chronic obstructive pulmonary disease with (acute) exacerbation: Secondary | ICD-10-CM

## 2016-11-14 DIAGNOSIS — Z9981 Dependence on supplemental oxygen: Secondary | ICD-10-CM

## 2016-11-14 DIAGNOSIS — E1122 Type 2 diabetes mellitus with diabetic chronic kidney disease: Secondary | ICD-10-CM | POA: Diagnosis present

## 2016-11-14 DIAGNOSIS — R000 Tachycardia, unspecified: Secondary | ICD-10-CM | POA: Diagnosis present

## 2016-11-14 DIAGNOSIS — J9819 Other pulmonary collapse: Secondary | ICD-10-CM | POA: Diagnosis present

## 2016-11-14 DIAGNOSIS — J9602 Acute respiratory failure with hypercapnia: Secondary | ICD-10-CM

## 2016-11-14 DIAGNOSIS — Z79899 Other long term (current) drug therapy: Secondary | ICD-10-CM

## 2016-11-14 DIAGNOSIS — C3492 Malignant neoplasm of unspecified part of left bronchus or lung: Secondary | ICD-10-CM | POA: Diagnosis not present

## 2016-11-14 DIAGNOSIS — I2781 Cor pulmonale (chronic): Secondary | ICD-10-CM | POA: Diagnosis present

## 2016-11-14 DIAGNOSIS — G4730 Sleep apnea, unspecified: Secondary | ICD-10-CM | POA: Diagnosis present

## 2016-11-14 DIAGNOSIS — J90 Pleural effusion, not elsewhere classified: Secondary | ICD-10-CM | POA: Diagnosis not present

## 2016-11-14 DIAGNOSIS — Z4682 Encounter for fitting and adjustment of non-vascular catheter: Secondary | ICD-10-CM | POA: Diagnosis not present

## 2016-11-14 DIAGNOSIS — N184 Chronic kidney disease, stage 4 (severe): Secondary | ICD-10-CM | POA: Diagnosis not present

## 2016-11-14 DIAGNOSIS — J9622 Acute and chronic respiratory failure with hypercapnia: Secondary | ICD-10-CM | POA: Diagnosis not present

## 2016-11-14 DIAGNOSIS — I2729 Other secondary pulmonary hypertension: Secondary | ICD-10-CM | POA: Diagnosis present

## 2016-11-14 DIAGNOSIS — I110 Hypertensive heart disease with heart failure: Secondary | ICD-10-CM | POA: Diagnosis not present

## 2016-11-14 DIAGNOSIS — D649 Anemia, unspecified: Secondary | ICD-10-CM | POA: Diagnosis present

## 2016-11-14 DIAGNOSIS — Z955 Presence of coronary angioplasty implant and graft: Secondary | ICD-10-CM | POA: Diagnosis not present

## 2016-11-14 DIAGNOSIS — F329 Major depressive disorder, single episode, unspecified: Secondary | ICD-10-CM | POA: Diagnosis present

## 2016-11-14 DIAGNOSIS — J9600 Acute respiratory failure, unspecified whether with hypoxia or hypercapnia: Secondary | ICD-10-CM | POA: Diagnosis present

## 2016-11-14 DIAGNOSIS — Z85118 Personal history of other malignant neoplasm of bronchus and lung: Secondary | ICD-10-CM

## 2016-11-14 DIAGNOSIS — Z87891 Personal history of nicotine dependence: Secondary | ICD-10-CM

## 2016-11-14 DIAGNOSIS — I509 Heart failure, unspecified: Secondary | ICD-10-CM | POA: Diagnosis not present

## 2016-11-14 DIAGNOSIS — Z8249 Family history of ischemic heart disease and other diseases of the circulatory system: Secondary | ICD-10-CM

## 2016-11-14 DIAGNOSIS — Z888 Allergy status to other drugs, medicaments and biological substances status: Secondary | ICD-10-CM

## 2016-11-14 DIAGNOSIS — J9621 Acute and chronic respiratory failure with hypoxia: Secondary | ICD-10-CM | POA: Diagnosis not present

## 2016-11-14 DIAGNOSIS — Z6841 Body Mass Index (BMI) 40.0 and over, adult: Secondary | ICD-10-CM

## 2016-11-14 DIAGNOSIS — E874 Mixed disorder of acid-base balance: Secondary | ICD-10-CM | POA: Diagnosis not present

## 2016-11-14 DIAGNOSIS — Z8701 Personal history of pneumonia (recurrent): Secondary | ICD-10-CM

## 2016-11-14 DIAGNOSIS — I5032 Chronic diastolic (congestive) heart failure: Secondary | ICD-10-CM | POA: Diagnosis not present

## 2016-11-14 DIAGNOSIS — E785 Hyperlipidemia, unspecified: Secondary | ICD-10-CM | POA: Diagnosis present

## 2016-11-14 DIAGNOSIS — I5030 Unspecified diastolic (congestive) heart failure: Secondary | ICD-10-CM | POA: Diagnosis not present

## 2016-11-14 DIAGNOSIS — I272 Pulmonary hypertension, unspecified: Secondary | ICD-10-CM | POA: Diagnosis not present

## 2016-11-14 DIAGNOSIS — D72829 Elevated white blood cell count, unspecified: Secondary | ICD-10-CM | POA: Diagnosis present

## 2016-11-14 DIAGNOSIS — G4733 Obstructive sleep apnea (adult) (pediatric): Secondary | ICD-10-CM | POA: Diagnosis not present

## 2016-11-14 DIAGNOSIS — Z8614 Personal history of Methicillin resistant Staphylococcus aureus infection: Secondary | ICD-10-CM

## 2016-11-14 DIAGNOSIS — E119 Type 2 diabetes mellitus without complications: Secondary | ICD-10-CM | POA: Diagnosis not present

## 2016-11-14 DIAGNOSIS — Z794 Long term (current) use of insulin: Secondary | ICD-10-CM | POA: Diagnosis not present

## 2016-11-14 DIAGNOSIS — Z923 Personal history of irradiation: Secondary | ICD-10-CM

## 2016-11-14 DIAGNOSIS — J449 Chronic obstructive pulmonary disease, unspecified: Secondary | ICD-10-CM | POA: Diagnosis not present

## 2016-11-14 DIAGNOSIS — Z7982 Long term (current) use of aspirin: Secondary | ICD-10-CM

## 2016-11-14 LAB — I-STAT ARTERIAL BLOOD GAS, ED
ACID-BASE EXCESS: 10 mmol/L — AB (ref 0.0–2.0)
BICARBONATE: 38.1 mmol/L — AB (ref 20.0–28.0)
O2 SAT: 82 %
PH ART: 7.305 — AB (ref 7.350–7.450)
PO2 ART: 53 mmHg — AB (ref 83.0–108.0)
Patient temperature: 98.6
TCO2: 40 mmol/L (ref 0–100)
pCO2 arterial: 76.5 mmHg (ref 32.0–48.0)

## 2016-11-14 LAB — CBC WITH DIFFERENTIAL/PLATELET
BASOS ABS: 0 10*3/uL (ref 0.0–0.1)
Basophils Relative: 0 %
Eosinophils Absolute: 0.2 10*3/uL (ref 0.0–0.7)
Eosinophils Relative: 2 %
HEMATOCRIT: 31.3 % — AB (ref 36.0–46.0)
Hemoglobin: 9.2 g/dL — ABNORMAL LOW (ref 12.0–15.0)
LYMPHS PCT: 4 %
Lymphs Abs: 0.6 10*3/uL — ABNORMAL LOW (ref 0.7–4.0)
MCH: 28.6 pg (ref 26.0–34.0)
MCHC: 29.4 g/dL — ABNORMAL LOW (ref 30.0–36.0)
MCV: 97.2 fL (ref 78.0–100.0)
Monocytes Absolute: 0.4 10*3/uL (ref 0.1–1.0)
Monocytes Relative: 3 %
NEUTROS ABS: 12.3 10*3/uL — AB (ref 1.7–7.7)
Neutrophils Relative %: 91 %
PLATELETS: 251 10*3/uL (ref 150–400)
RBC: 3.22 MIL/uL — AB (ref 3.87–5.11)
RDW: 14.2 % (ref 11.5–15.5)
WBC: 13.6 10*3/uL — AB (ref 4.0–10.5)

## 2016-11-14 LAB — I-STAT TROPONIN, ED: Troponin i, poc: 0.03 ng/mL (ref 0.00–0.08)

## 2016-11-14 LAB — COMPREHENSIVE METABOLIC PANEL
ALT: 7 U/L — AB (ref 14–54)
AST: 10 U/L — AB (ref 15–41)
Albumin: 2.5 g/dL — ABNORMAL LOW (ref 3.5–5.0)
Alkaline Phosphatase: 124 U/L (ref 38–126)
Anion gap: 10 (ref 5–15)
BUN: 10 mg/dL (ref 6–20)
CHLORIDE: 98 mmol/L — AB (ref 101–111)
CO2: 34 mmol/L — ABNORMAL HIGH (ref 22–32)
CREATININE: 2.42 mg/dL — AB (ref 0.44–1.00)
Calcium: 8.5 mg/dL — ABNORMAL LOW (ref 8.9–10.3)
GFR calc Af Amer: 24 mL/min — ABNORMAL LOW (ref >60)
GFR, EST NON AFRICAN AMERICAN: 20 mL/min — AB (ref >60)
GLUCOSE: 177 mg/dL — AB (ref 65–99)
Potassium: 3.9 mmol/L (ref 3.5–5.1)
Sodium: 142 mmol/L (ref 135–145)
Total Bilirubin: 0.6 mg/dL (ref 0.3–1.2)
Total Protein: 6.2 g/dL — ABNORMAL LOW (ref 6.5–8.1)

## 2016-11-14 LAB — I-STAT CG4 LACTIC ACID, ED: Lactic Acid, Venous: 2.24 mmol/L (ref 0.5–1.9)

## 2016-11-14 LAB — D-DIMER, QUANTITATIVE: D-Dimer, Quant: 1.79 ug/mL-FEU — ABNORMAL HIGH (ref 0.00–0.50)

## 2016-11-14 LAB — LACTIC ACID, PLASMA
LACTIC ACID, VENOUS: 1.5 mmol/L (ref 0.5–1.9)
Lactic Acid, Venous: 2.1 mmol/L (ref 0.5–1.9)

## 2016-11-14 LAB — BRAIN NATRIURETIC PEPTIDE: B Natriuretic Peptide: 190 pg/mL — ABNORMAL HIGH (ref 0.0–100.0)

## 2016-11-14 LAB — TROPONIN I

## 2016-11-14 LAB — MRSA PCR SCREENING: MRSA BY PCR: NEGATIVE

## 2016-11-14 MED ORDER — ENOXAPARIN SODIUM 40 MG/0.4ML ~~LOC~~ SOLN
40.0000 mg | SUBCUTANEOUS | Status: DC
  Administered 2016-11-14 – 2016-11-16 (×3): 40 mg via SUBCUTANEOUS
  Filled 2016-11-14 (×3): qty 0.4

## 2016-11-14 MED ORDER — METHYLPREDNISOLONE SODIUM SUCC 125 MG IJ SOLR: 125.0000 mg | Freq: Once | INTRAMUSCULAR | Status: DC

## 2016-11-14 MED ORDER — SENNA 8.6 MG PO TABS: 2.0000 | ORAL_TABLET | Freq: Every day | ORAL | Status: DC | PRN

## 2016-11-14 MED ORDER — VITAMIN B-12 1000 MCG PO TABS: 1000.0000 ug | ORAL_TABLET | Freq: Every day | ORAL | Status: DC

## 2016-11-14 MED ORDER — POLYETHYLENE GLYCOL 3350 17 G PO PACK: 17.0000 g | PACK | Freq: Every day | ORAL | Status: DC | PRN

## 2016-11-14 MED ORDER — IPRATROPIUM-ALBUTEROL 0.5-2.5 (3) MG/3ML IN SOLN
3.0000 mL | Freq: Four times a day (QID) | RESPIRATORY_TRACT | Status: DC
  Administered 2016-11-14 – 2016-11-15 (×5): 3 mL via RESPIRATORY_TRACT
  Filled 2016-11-14 (×5): qty 3

## 2016-11-14 MED ORDER — IPRATROPIUM-ALBUTEROL 0.5-2.5 (3) MG/3ML IN SOLN
3.0000 mL | RESPIRATORY_TRACT | Status: DC
  Administered 2016-11-14 (×2): 3 mL via RESPIRATORY_TRACT
  Filled 2016-11-14 (×2): qty 3

## 2016-11-14 MED ORDER — PREDNISONE 20 MG PO TABS
40.0000 mg | ORAL_TABLET | Freq: Every day | ORAL | Status: DC
  Administered 2016-11-15 – 2016-11-18 (×4): 40 mg via ORAL
  Filled 2016-11-14 (×5): qty 2

## 2016-11-14 MED ORDER — METOPROLOL TARTRATE 5 MG/5ML IV SOLN
2.5000 mg | Freq: Once | INTRAVENOUS | Status: AC
  Administered 2016-11-14: 2.5 mg via INTRAVENOUS
  Filled 2016-11-14: qty 5

## 2016-11-14 MED ORDER — METOPROLOL TARTRATE 5 MG/5ML IV SOLN: 2.5000 mg | INTRAVENOUS | Status: DC | PRN

## 2016-11-14 MED ORDER — ORAL CARE MOUTH RINSE
15.0000 mL | Freq: Two times a day (BID) | OROMUCOSAL | Status: DC
  Administered 2016-11-15: 15 mL via OROMUCOSAL

## 2016-11-14 MED ORDER — ROSUVASTATIN CALCIUM 20 MG PO TABS
20.0000 mg | ORAL_TABLET | Freq: Every day | ORAL | Status: DC
  Administered 2016-11-14 – 2016-11-17 (×4): 20 mg via ORAL
  Filled 2016-11-14 (×4): qty 1

## 2016-11-14 MED ORDER — ASPIRIN 81 MG PO CHEW
81.0000 mg | CHEWABLE_TABLET | Freq: Every day | ORAL | Status: DC
  Administered 2016-11-14 – 2016-11-17 (×4): 81 mg via ORAL
  Filled 2016-11-14 (×4): qty 1

## 2016-11-14 MED ORDER — FLUTICASONE-UMECLIDIN-VILANT 100-62.5-25 MCG/INH IN AEPB: 1.0000 | INHALATION_SPRAY | Freq: Every day | RESPIRATORY_TRACT | Status: DC

## 2016-11-14 MED ORDER — ALBUTEROL SULFATE (2.5 MG/3ML) 0.083% IN NEBU
2.5000 mg | INHALATION_SOLUTION | RESPIRATORY_TRACT | Status: DC | PRN
  Administered 2016-11-18: 2.5 mg via RESPIRATORY_TRACT
  Filled 2016-11-14: qty 3

## 2016-11-14 MED ORDER — SODIUM CHLORIDE 0.9% FLUSH
3.0000 mL | Freq: Two times a day (BID) | INTRAVENOUS | Status: DC
  Administered 2016-11-14 – 2016-11-17 (×7): 3 mL via INTRAVENOUS

## 2016-11-14 MED ORDER — PANTOPRAZOLE SODIUM 40 MG PO TBEC
40.0000 mg | DELAYED_RELEASE_TABLET | Freq: Every day | ORAL | Status: DC
Start: 2016-11-15 — End: 2016-11-18
  Administered 2016-11-15 – 2016-11-18 (×4): 40 mg via ORAL
  Filled 2016-11-14 (×4): qty 1

## 2016-11-14 MED ORDER — FUROSEMIDE 10 MG/ML IJ SOLN
40.0000 mg | Freq: Once | INTRAMUSCULAR | Status: DC
  Filled 2016-11-14 (×3): qty 4

## 2016-11-14 MED ORDER — BUPROPION HCL ER (SR) 150 MG PO TB12
150.0000 mg | ORAL_TABLET | Freq: Two times a day (BID) | ORAL | Status: DC
Start: 2016-11-14 — End: 2016-11-18
  Administered 2016-11-14 – 2016-11-18 (×8): 150 mg via ORAL
  Filled 2016-11-14 (×10): qty 1

## 2016-11-14 NOTE — ED Notes (Signed)
Patient placed on nonrebreather 15L at 97%

## 2016-11-14 NOTE — ED Notes (Signed)
Patient 83% on 6L nasal canula

## 2016-11-14 NOTE — H&P (Signed)
Date: 11/14/2016               Patient Name:  [name] MRN: [id number]  DOB: [birth year] Age / Sex: 61 y.o., female   PCP: Oval Linsey, MD         Medical Service: Internal Medicine Teaching Service         Attending Physician: Dr. Annia Belt, MD    First Contact: Dr. Aggie Hacker, Sam Pager: [phone]  Second Contact: Dr. 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 Her dyspnea is associated with generalized weakness and fatigue. Her SOB is occurring with minimal activity. She is typically on 4L of oxygen at home. She states that she has been doing albuterol nebulizer treatments 2-3 times a day for the past several days with minimal improvement in SOB. Home health nursing comes several days a week to drain her reccurent left sided effusion via PleurX catheter, and noted that the patient desatted to about 75-80% on 4L of O2 and convinced her to come to the ED. Approximately  5 ml of blood tinged fluid was drained today. She denies nausea, vomiting, or chest pain. She complains of subjective low grade fevers and chills, and dry cough.   In the ED, she was received 3 Duoneb treatments, metoprolol 2.5 mg for tachycardia into 140s, and  refused 40 mg of IV lasix. Pertinent labs: Mild leukocytosis 13.6, ABG consistent with primary respiratory acidosis, lactic acid 2.24. CXR showed persistent left pleural effusion, cardiomegaly and vascular congestion. Patient was initially on 6L sating 100%-->dropped to 80% on 6L after getting out of bed to go to the  bathroom. She was returned to bed and placed on nonrebreather 10L at 99%. Eventually put back on 6L [place] 95% O2.   Meds:  No outpatient prescriptions have been marked as taking for the 11/14/16 encounter [facility] Encounter).     Allergies: Allergies as of 11/14/2016 - Review Complete 11/14/2016  Allergen Reaction Noted  . Fluconazole Anaphylaxis and Itching   . Atorvastatin Other (See Comments) 09/09/2014   Past Medical History:  Diagnosis Date  . Arthritis    "some scattered" (03/03/2015)  . CHF (congestive heart failure) ([place])   . Chronic bronchitis ([place])    "get it most years" (03/03/2015)  . COPD (chronic obstructive pulmonary disease) (HCC)    Severe. Gold Stage IV.  PFTs (12/2008) - severe obstructive airway disease. Active tobacco use. Requires 4L O2 at home.  . Coronary artery disease    S/P PCI of LAD with DES (12/2008). Total occlusion of RCA noted at that time., medically managed. ACS ruled out 03/2009 with Lexiscan myoview . Followed by .  . Depression   . Diabetes mellitus without complication ([place])   . Diastolic dysfunction    2-D Echo (12/2008) - Normal LV Systolic funciton with EF 60-65%. Grade 1 diastolid dysfunction. No regional wall motion abnormalities. Moderate pulmonary HTN with PA peak pressure 89mHg.  . Full dentures   . GERD (gastroesophageal reflux disease)    S/P Nissen fundoplication.  .Marland KitchenHistory of hiatal hernia   .  Hx MRSA infection    Recurrent MRSA thigh abscesses.  . Hyperlipidemia   . Lung cancer ([place])    "left"  . Obesity   . On home oxygen therapy since 2010   4L all the time  . Pneumonia   . Prediabetes    HgbA1c 6.4 (12/2008)  . Pulmonary hypertension ([place])    2-D Echo (26/3785) - Systolic pressure was moderately increased. PA peak pressure  32mHg. 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 Followed by Dr. WJoya Gaskins(Velora Heckler  .  Pulmonary nodule, right    Small right middle lobe nodule. Stable as of 12/2008.  .Marland KitchenShortness of breath dyspnea    with a lot of exertion; if fluid builds up    Family History:  Family History  Problem Relation Age of Onset  . Heart disease Mother 461      Deceased from MI at 430yo . Hypertension Mother   . Heart disease Father 537      Deceased of MI age 62yo . Hypertension Father   . Hypertension Brother   . Lung cancer Unknown        Grandmother    Social History:  Social History    Social History Main Topics  . Smoking status: Former Smoker    Packs/day: 1.00    Years: 45.00    Types: Cigarettes    Start date: 04/04/1969    Quit date: 11/04/2015  . Smokeless tobacco: Never Used     Comment: 1 cig per day  . Alcohol use No  . Drug use: No  . Sexual activity: Not Currently    Birth control/ protection: None    Review of Systems: A complete ROS was negative except as per HPI.  Physical Exam: Blood pressure 140/77, pulse (!) 111, temperature 98.7 F (37.1 C), temperature source Oral, resp. rate (!) 22, height _0  (1.651 m), weight 265 lb (120.2 kg), SpO2 94 %. Physical Exam  Constitutional: She is oriented to person, place, and time. She appears well-developed and well-nourished.  HENT:  Head: Normocephalic and atraumatic.  Eyes: Pupils are equal, round, and reactive to light. Conjunctivae and EOM are normal.  Neck: Normal range of motion. Neck supple.  Cardiovascular: Regular rhythm.  Tachycardia present.   Pulmonary/Chest: Breath sounds normal. She is in respiratory distress (dyspnic when speaking).  Abdominal: Soft. Bowel sounds are normal.  Musculoskeletal: Normal range of motion. She exhibits edema (bilateral peripheral edema 1+).  Neurological: She is alert and oriented to person, place, and time.  Skin: Skin is warm and dry.  Psychiatric: She has a normal mood and affect. Her behavior is normal.    EKG: personally reviewed my interpretation is sinus  tachycardia, prolonged PR interval.  CXR: personally reviewed my interpretation is left pleural effusion, cardiomegaly and chronic vascular congestion.  Assessment & Plan by Problem: Active Problems:   Acute respiratory failure (HCC) Unclear etiology of acute respiratory failure. Primary respiratory acidosis, with secondary metabolic alkalosis. Most likely secondary to COPD exacerbation vs CHF exacerbation. Previous ECHO 18/8502systolic function was normal with estimated ejection fraction 55% to 677% grade 1 diastolic dysfunction. Refused Lasix in the ED.  -Prednisone 40 mg starting tomorrow  -Duonebs q6 hours, Proventil neb q4 PRN -Trelegy 1 puff once daily -Holding abx treatment for now -Continue oxygenation with [place] keeping O2 saturation between 88-92%. -Follow up BNP -ABG in the AM  HLD, CAD -Continue Crestor 20 mg daily -Aspirin 81 mg daily  GERD -Pantoprazole 40 mg daily  CKD  Creatinine 2.42, patient's baseline ~2.  -Continue to monitor  -Holding fluids for now   Dispo: Admit patient to Inpatient with expected length of stay greater than 2 midnights.  Signed: Melanee Spry, MD 11/14/2016, 6:01 PM  Pager: [phone]

## 2016-11-14 NOTE — ED Triage Notes (Signed)
Per [facility] patient coming from home complaining of SOB. Patient has hx of COPD,  CHF, diabetes, and neuropathy. Patient used home nebs with no relief today. 81% on room air. EMS gave a total of 10 albuterol, 0.5 of atrovent and 125 solumedrol in route. Patient currently 99% on nebulizer 6L. Patient currently has drain to left lung that a home health nurse comes to drain fluid related to CHF. 59m blood tinged fluid drained today. Patient alert and oriented x4.

## 2016-11-14 NOTE — ED Notes (Signed)
Patient to xray.

## 2016-11-14 NOTE — ED Notes (Signed)
Patient refusing lasix at this time

## 2016-11-14 NOTE — ED Provider Notes (Signed)
[facility] DEPT Provider Note   CSN: [id number] Arrival date & time: 11/14/16  1339     History   Chief Complaint Chief Complaint  Patient presents with  . Shortness of Breath    HPI [name] is a 61 y.o. female.  HPI  Dyspnea worsening over the last few days, feels short of breath with minimal activity. Tried breathing treatments at home but no improvement. O2 level was low on home O2 of 4L in 80s so came to ED.  Misses lasix doses sometimes, not sure if has missed recently. Does not check daily weights.  Legs are a little more swollen. Has orthopnea which is otherwise unchanged from baseline. "I don't try to lay flat."  No recent travel, no leg pain or asymmetric swelling.   No chest pain. Has had cough for the last month, has been dry, increasing slightly.  No fevers.   Past Medical History:  Diagnosis Date  . Arthritis    "some scattered" (03/03/2015)  . CHF (congestive heart failure) ([place])   . Chronic bronchitis ([place])    "get it most years" (03/03/2015)  . COPD (chronic obstructive pulmonary disease) (HCC)    Severe. Gold Stage IV.  PFTs (12/2008) - severe obstructive airway disease. Active tobacco use. Requires 4L O2 at home.  . Coronary artery disease    S/P PCI of LAD with DES (12/2008). Total occlusion of RCA noted at that time., medically managed. ACS ruled out 03/2009 with Lexiscan myoview . Followed by [facility].  . Depression   . Diabetes mellitus without complication ([place])   . Diastolic dysfunction    2-D Echo (12/2008) - Normal LV Systolic funciton with EF 60-65%. Grade 1 diastolid dysfunction. No regional wall motion abnormalities. Moderate pulmonary HTN with PA peak pressure 79mHg.  . Full dentures   . GERD (gastroesophageal reflux disease)    S/P Nissen fundoplication.  .Marland KitchenHistory of hiatal hernia   . Hx MRSA infection    Recurrent MRSA thigh abscesses.  . Hyperlipidemia   . Lung cancer (HE[place]condido    "left"  . Obesity   . On home oxygen therapy  since 2010   4L all the time  . Pneumonia   . Prediabetes    HgbA1c 6.4 (12/2008)  . Pulmonary hypertension ([place]    2-D Echo (029/9242 - Systolic pressure was moderately increased. PA peak pressure  591mg. 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 Followed by Dr. WrJoya GaskinsLVelora Heckler . Pulmonary nodule, right    Small right middle lobe nodule. Stable as of 12/2008.  . Marland Kitchenhortness of breath dyspnea    with a lot of exertion; if fluid builds up    Patient Active Problem List   Diagnosis Date Noted  . Acute respiratory failure (HCC[place]08/13/2018  . Morbid obesity (HCGreen Isle  . Acute on chronic respiratory failure (HCTaylor Creek06/19/2018  . COPD exacerbation (HCEl[place]05/22/2018  . Pleural effusion on left   . Constipation due to slow transit   . History of adenocarcinoma of lung   . Chronic kidney disease (CKD), active medical management without dialysis, stage 4 (severe) (HCLemont Furnace11/05/2015  . Benign paroxysmal positional vertigo 09/10/2015  . Subjective tinnitus 09/10/2015  . Chronic venous insufficiency   . Vitamin B12 deficiency neuropathy (HCSouth [place]09/18/2016  . Adenocarcinoma of lung, stage 2, left (HCPaint Rock09/04/2014  . Healthcare maintenance 02/06/2013  . Chronic diastolic heart failure (HCPilot Rock08/07/2012  .  Other emphysema ([place])   . Hyperlipidemia   . Financial difficulties 06/15/2010  . Pulmonary hypertension ([place]) 01/05/2009  . Morbid obesity with BMI of 40.0-44.9, adult ([place]) 12/31/2008  . Sleep apnea 12/31/2008  . Shortness of breath 12/31/2008  . Tobacco abuse 12/22/2008  . 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 Severe chronic obstructive pulmonary disease ([place]) 12/22/2008  . Gastroesophageal reflux disease 12/22/2008  . 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 ABDOMINAL HYSTERECTOMY  1980's   "still have my cervix"  . APPENDECTOMY    . BACK SURGERY    . BILATERAL OOPHORECTOMY  1979  . BREAST LUMPECTOMY Right 1990's    (biopsy negative)  . CHEST TUBE INSERTION Left 09/23/2016   Procedure: INSERTION LEFT PLEURAL DRAINAGE CATHETER;  Surgeon: Ivin Poot, MD;  Location: [facility];  Service: Thoracic;  Laterality: Left;  . CORONARY ANGIOPLASTY WITH STENT PLACEMENT  2010   [facility]; RCA  . HERNIA REPAIR    . IR THORACENTESIS ASP PLEURAL SPACE W/IMG GUIDE  07/18/2016  . IR THORACENTESIS ASP PLEURAL SPACE W/IMG GUIDE  08/24/2016  . KNEE ARTHROSCOPY Right ~ 2000  . [place] SURGERY  2001   "herniated discs; both by [facility], Dr. Lorin Mercy"  . NISSEN FUNDOPLICATION    . RIGHT HEART CATHETERIZATION N/A 11/09/2012   Procedure: RIGHT HEART CATH;  Surgeon: Larey Dresser, MD;  Location: [facility] CATH LAB;  Service: Cardiovascular;  Laterality: N/A;  . TONSILLECTOMY    . TUBAL LIGATION  1979  . VIDEO BRONCHOSCOPY WITH ENDOBRONCHIAL ULTRASOUND N/A 12/15/2014   Procedure: VIDEO BRONCHOSCOPY WITH ENDOBRONCHIAL ULTRASOUND;  Surgeon: Ivin Poot, MD;  [facility];  Service: Thoracic;  Laterality: N/A;    OB History    No data available       Home Medications    Prior to Admission medications   Medication Sig Start Date End Date Taking? Authorizing Provider  acetaminophen-codeine (TYLENOL #3) 300-30 MG tablet Take 1 tablet by mouth every 6 (six) hours as needed for severe pain. 09/24/16  Yes Hoffman, Jessica Ratliff, DO  albuterol (VENTOLIN HFA) 108 (90 Base) MCG/ACT inhaler Inhale 1-2 puffs into the lungs every 6 (six) hours as needed for shortness of breath. 02/03/16  Yes Oval Linsey, MD  aspirin 81 MG chewable tablet Chew 1 tablet (81 mg total) by mouth at bedtime. 01/16/15  Yes Oval Linsey, MD  buPROPion [facility] SR) 150 MG 12 hr tablet Take 1 tablet (150 mg total) by mouth 2 (two) times daily. 10/03/16  Yes Oval Linsey, MD   Fluticasone-Umeclidin-Vilant (TRELEGY ELLIPTA) 100-62.5-25 MCG/INH AEPB Inhale 1 puff into the lungs daily. 10/03/16  Yes Oval Linsey, MD  furosemide (LASIX) 40 MG tablet Take 1 tablet (40 mg total) by mouth daily. 08/25/16  Yes Molt, [place], DO  gabapentin (NEURONTIN) 300 MG capsule Take 2 capsules (600 mg total) by mouth at bedtime. 08/05/16  Yes Oval Linsey, MD  ipratropium-albuterol (DUONEB) 0.5-2.5 (3) MG/3ML SOLN USE 3 ML VIA NEBULIZER EVERY 6 HOURS AS NEEDED FOR SHORTNESS OF BREATH 10/19/16  Yes Annia Belt, MD  metolazone (ZAROXOLYN) 2.5 MG tablet TAKE 1 TABLET BY MOUTH AS NEEDED(FOR 5 LBS WEIGHT GAIN) Patient taking differently: Take 2.5 mg by mouth once a day as needed for a weight gain of 5 pounds 05/13/15  Yes [name] Friar, PA-C  pantoprazole (PROTONIX) 40 MG tablet  Take 1 tablet (40 mg total) by mouth daily. 11/05/15  Yes Oval Linsey, MD  polyethylene glycol [facility] / GLYCOLAX) packet Take 17 g by mouth daily as needed for moderate constipation or severe constipation. 08/24/16  Yes Molt, [place], DO  Pyridoxine HCl (VITAMIN B-6 PO) Take 1 tablet by mouth daily.   Yes [provider]  ranitidine (ZANTAC) 300 MG tablet Take 1 tablet (300 mg total) by mouth at bedtime. 08/05/16  Yes Oval Linsey, MD  rosuvastatin (CRESTOR) 20 MG tablet Take 1 tablet (20 mg total) by mouth daily. 10/03/16  Yes Oval Linsey, MD  senna (SENOKOT) 8.6 MG TABS tablet Take 2 tablets (17.2 mg total) by mouth daily as needed for mild constipation or moderate constipation. 08/24/16  Yes Molt, [place], DO    Family History Family History  Problem Relation Age of Onset  . Heart disease Mother 56       Deceased from MI at 52yo  . Hypertension Mother   . Heart disease Father 94       Deceased of MI age 38yo  . Hypertension Father   . Hypertension Brother   . Lung cancer Unknown        Grandmother    Social History Social History  Substance Use Topics  . Smoking status:  Former Smoker    Packs/day: 1.00    Years: 45.00    Types: Cigarettes    Start date: 04/04/1969    Quit date: 11/04/2015  . Smokeless tobacco: Never Used     Comment: 1 cig per day  . Alcohol use No     Allergies   Fluconazole and Atorvastatin   Review of Systems Review of Systems  Constitutional: Negative for chills and fever.  HENT: Negative for sore throat.   Eyes: Negative for visual disturbance.  Respiratory: Positive for cough and shortness of breath.   Cardiovascular: Positive for leg swelling. Negative for chest pain.  Gastrointestinal: Negative for abdominal pain, anal bleeding, constipation, diarrhea, nausea and vomiting.  Genitourinary: Negative for difficulty urinating and dysuria.  Musculoskeletal: Negative for back pain and neck pain.  Skin: Negative for rash.  Neurological: Negative for syncope and headaches.     Physical Exam Updated Vital Signs BP (!) 113/49 (BP Location: Right Wrist)   Pulse (!) 103   Temp 98.3 F (36.8 C) (Oral)   Resp 19   Ht _0  (1.651 m)   Wt 120.2 kg (265 lb)   SpO2 97%   BMI 44.10 kg/m   Physical Exam  Constitutional: She is oriented to person, place, and time. She appears well-developed and well-nourished. No distress.  HENT:  Head: Normocephalic and atraumatic.  Eyes: Conjunctivae and EOM are normal.  Neck: Normal range of motion. JVD: limited by body habitus.  Cardiovascular: Normal rate, regular rhythm, normal heart sounds and intact distal pulses.  Exam reveals no gallop and no friction rub.   No murmur heard. Pulmonary/Chest: Effort normal. No respiratory distress. She has decreased breath sounds in the left lower field. She has wheezes. She has no rales.  Abdominal: Soft. She exhibits no distension. There is no tenderness. There is no guarding.  Musculoskeletal: She exhibits edema (mild). She exhibits no tenderness.  Neurological: She is alert and oriented to person, place, and time.  Skin: Skin is warm and dry. No  rash noted. She is not diaphoretic. No erythema.  Nursing note and vitals reviewed.    ED Treatments / Results  Labs (all labs ordered are listed, but  only abnormal results are displayed) Labs Reviewed  CBC WITH DIFFERENTIAL/PLATELET - Abnormal; Notable for the following:       Result Value   WBC 13.6 (*)    RBC 3.22 (*)    Hemoglobin 9.2 (*)    HCT 31.3 (*)    MCHC 29.4 (*)    Neutro Abs 12.3 (*)    Lymphs Abs 0.6 (*)    All other components within normal limits  COMPREHENSIVE METABOLIC PANEL - Abnormal; Notable for the following:    Chloride 98 (*)    CO2 34 (*)    Glucose, Bld 177 (*)    Creatinine, Ser 2.42 (*)    Calcium 8.5 (*)    Total Protein 6.2 (*)    Albumin 2.5 (*)    AST 10 (*)    ALT 7 (*)    GFR calc non Af Amer 20 (*)    GFR calc Af Amer 24 (*)    All other components within normal limits  LACTIC ACID, PLASMA - Abnormal; Notable for the following:    Lactic Acid, Venous 2.1 (*)    All other components within normal limits  D-DIMER, QUANTITATIVE (NOT AT [facility]) - Abnormal; Notable for the following:    D-Dimer, Quant 1.79 (*)    All other components within normal limits  I-STAT CG4 LACTIC ACID, ED - Abnormal; Notable for the following:    Lactic Acid, Venous 2.24 (*)    All other components within normal limits  I-STAT ARTERIAL BLOOD GAS, ED - Abnormal; Notable for the following:    pH, Arterial 7.305 (*)    pCO2 arterial 76.5 (*)    pO2, Arterial 53.0 (*)    Bicarbonate 38.1 (*)    Acid-Base Excess 10.0 (*)    All other components within normal limits  MRSA PCR SCREENING  TROPONIN I  BRAIN NATRIURETIC PEPTIDE  BLOOD GAS, ARTERIAL  BASIC METABOLIC PANEL  CBC  LACTIC ACID, PLASMA  I-STAT TROPONIN, ED    EKG  EKG Interpretation  Date/Time:  Monday November 14 2016 16:49:24 EDT Ventricular Rate:  141 PR Interval:    QRS Duration: 109 QT Interval:  309 QTC Calculation: 474 R Axis:   96 Text Interpretation:  Sinus tachycardia Prolonged PR  interval Consider left atrial enlargement Right axis deviation Low voltage, extremity and precordial leads Nonspecific T abnormalities, lateral leads No significant change since last tracing Confirmed by Gareth Morgan [phone]) on 11/14/2016 11:02:32 PM       Radiology Dg Chest 2 View  Result Date: 11/14/2016 CLINICAL DATA:  Shortness of breath. History of COPD, CHF. No relief with home nebulizer treatment. Patient has an indwelling pleural space catheter on the left due to pleural effusion. EXAM: CHEST  2 VIEW COMPARISON:  Chest x-ray of October 26, 2016. FINDINGS: The right lung is well-expanded. The interstitial markings are coarse and more conspicuous on the right today. On the left there is a moderate-sized pleural effusion not greatly changed from the previous study. The interstitial markings in the aerated portion of the left lung are mildly increased. The cardiac silhouette is enlarged. The pulmonary vascularity is indistinct. IMPRESSION: CHF superimposed upon COPD or reactive airway disease. Slightly increased interstitial edema greatest on the right is observed today. Fairly stable moderate size left pleural effusion. Electronically Signed   By: David  Martinique M.D.   On: 11/14/2016 14:58    Procedures Procedures (including critical care time)  Medications Ordered in ED Medications  furosemide (LASIX) injection 40 mg (  not administered)  enoxaparin (LOVENOX) injection 40 mg (40 mg Subcutaneous Given 11/14/16 2106)  sodium chloride flush (NS) 0.9 % injection 3 mL (3 mLs Intravenous Given 11/14/16 2137)  aspirin chewable tablet 81 mg (81 mg Oral Given 11/14/16 2107)  buPROPion [facility] SR) 12 hr tablet 150 mg (150 mg Oral Given 11/14/16 2107)  Fluticasone-Umeclidin-Vilant 100-62.5-25 MCG/INH AEPB 1 puff (not administered)  pantoprazole (PROTONIX) EC tablet 40 mg (not administered)  polyethylene glycol (MIRALAX / GLYCOLAX) packet 17 g (not administered)  rosuvastatin (CRESTOR) tablet 20 mg  (20 mg Oral Given 11/14/16 2123)  senna (SENOKOT) tablet 17.2 mg (not administered)  ipratropium-albuterol (DUONEB) 0.5-2.5 (3) MG/3ML nebulizer solution 3 mL (3 mLs Nebulization Given 11/14/16 2025)  albuterol (PROVENTIL) (2.5 MG/3ML) 0.083% nebulizer solution 2.5 mg (not administered)  predniSONE (DELTASONE) tablet 40 mg (not administered)  metoprolol tartrate (LOPRESSOR) injection 2.5 mg (not administered)  metoprolol tartrate (LOPRESSOR) injection 2.5 mg (2.5 mg Intravenous Given 11/14/16 1651)     Initial Impression / Assessment and Plan / ED Course  I have reviewed the triage vital signs and the nursing notes.  Pertinent labs & imaging results that were available during my care of the patient were reviewed by me and considered in my medical decision making (see chart for details).     61yo female with history of COPD on 4L of home O2, CHF diastolic, CAD, left sided pleural effusion with history of stage II non-small cell lung cancer 2016 post radiotherapy of the left lower lobe lung mass with exudative effusion that has been recurrent with pleurex cathter in place, rare lactobacillus seen before, who presents with concern for increasing dyspnea.  XR concerning for pulmonary edema, persistent pleural effusion.  Patient with some respiratory distress improved with nebs, suspect multifactorial etiology of dyspnea with COPD and CHF.  Patient with poor renal function, unable to obtain CTA, and given likely multifactorial reasons for dyspnea at this time low suspicion for PE unless she has poor clinical improvement with treatment for COPD/CHF at which point would consider further evaluation for PE.  Patient to be admitted to medicine service for further care.      Final Clinical Impressions(s) / ED Diagnoses   Final diagnoses:  COPD exacerbation ([place])  Congestive heart failure, unspecified HF chronicity, unspecified heart failure type [facility])    New Prescriptions Current Discharge Medication  List       Gareth Morgan, MD 11/14/16 2304

## 2016-11-14 NOTE — ED Notes (Signed)
Attempted to help patient to bedside commode. Patient became very short of breath and became unsteady on her feet. Patient o2 saturation dropped to 80% on 6L. Patient returned to bed and placed on nonrebreather 10L at 99 %

## 2016-11-14 NOTE — Progress Notes (Signed)
Spoke with patient about wearing Bipap due to high CO2 levels she said no way she could do it due to being claustrophobic.  I explained normal range for CO2 and that hers was not close to range.  Pt oxygen staying in the mid 80's. After nebulizer I starterd patient on high flow salter nasal canula she was on 5 reg before now on 5 with salter.

## 2016-11-14 NOTE — ED Notes (Signed)
Patient placed back on 6 L nasal canula at 95% oxygen saturation

## 2016-11-14 NOTE — ED Notes (Signed)
Patient advised that it is safest to use a bedpen for elimination at this time

## 2016-11-15 ENCOUNTER — Inpatient Hospital Stay (HOSPITAL_COMMUNITY): Payer: Medicare HMO

## 2016-11-15 ENCOUNTER — Telehealth: Payer: Self-pay

## 2016-11-15 ENCOUNTER — Other Ambulatory Visit: Payer: Self-pay | Admitting: *Deleted

## 2016-11-15 DIAGNOSIS — J441 Chronic obstructive pulmonary disease with (acute) exacerbation: Principal | ICD-10-CM

## 2016-11-15 DIAGNOSIS — J9621 Acute and chronic respiratory failure with hypoxia: Secondary | ICD-10-CM

## 2016-11-15 DIAGNOSIS — Z794 Long term (current) use of insulin: Secondary | ICD-10-CM

## 2016-11-15 DIAGNOSIS — J9622 Acute and chronic respiratory failure with hypercapnia: Secondary | ICD-10-CM

## 2016-11-15 DIAGNOSIS — N184 Chronic kidney disease, stage 4 (severe): Secondary | ICD-10-CM

## 2016-11-15 DIAGNOSIS — J90 Pleural effusion, not elsewhere classified: Secondary | ICD-10-CM

## 2016-11-15 DIAGNOSIS — G4733 Obstructive sleep apnea (adult) (pediatric): Secondary | ICD-10-CM

## 2016-11-15 DIAGNOSIS — E1122 Type 2 diabetes mellitus with diabetic chronic kidney disease: Secondary | ICD-10-CM

## 2016-11-15 DIAGNOSIS — J449 Chronic obstructive pulmonary disease, unspecified: Secondary | ICD-10-CM

## 2016-11-15 LAB — CBC
HCT: 28.7 % — ABNORMAL LOW (ref 36.0–46.0)
Hemoglobin: 8.3 g/dL — ABNORMAL LOW (ref 12.0–15.0)
MCH: 27.9 pg (ref 26.0–34.0)
MCHC: 28.9 g/dL — AB (ref 30.0–36.0)
MCV: 96.6 fL (ref 78.0–100.0)
PLATELETS: 246 10*3/uL (ref 150–400)
RBC: 2.97 MIL/uL — ABNORMAL LOW (ref 3.87–5.11)
RDW: 13.9 % (ref 11.5–15.5)
WBC: 12.6 10*3/uL — ABNORMAL HIGH (ref 4.0–10.5)

## 2016-11-15 LAB — BASIC METABOLIC PANEL
Anion gap: 4 — ABNORMAL LOW (ref 5–15)
BUN: 12 mg/dL (ref 6–20)
CHLORIDE: 96 mmol/L — AB (ref 101–111)
CO2: 40 mmol/L — AB (ref 22–32)
CREATININE: 2.4 mg/dL — AB (ref 0.44–1.00)
Calcium: 8.5 mg/dL — ABNORMAL LOW (ref 8.9–10.3)
GFR calc Af Amer: 24 mL/min — ABNORMAL LOW (ref >60)
GFR calc non Af Amer: 21 mL/min — ABNORMAL LOW (ref >60)
GLUCOSE: 223 mg/dL — AB (ref 65–99)
Potassium: 4.8 mmol/L (ref 3.5–5.1)
Sodium: 140 mmol/L (ref 135–145)

## 2016-11-15 LAB — BLOOD GAS, ARTERIAL
ACID-BASE EXCESS: 10.2 mmol/L — AB (ref 0.0–2.0)
BICARBONATE: 36.7 mmol/L — AB (ref 20.0–28.0)
DRAWN BY: 257081
O2 Content: 6 L/min
O2 Saturation: 96.3 %
PCO2 ART: 76.1 mmHg — AB (ref 32.0–48.0)
PH ART: 7.303 — AB (ref 7.350–7.450)
PO2 ART: 82 mmHg — AB (ref 83.0–108.0)
Patient temperature: 98.1

## 2016-11-15 MED ORDER — FLUTICASONE FUROATE-VILANTEROL 100-25 MCG/INH IN AEPB
1.0000 | INHALATION_SPRAY | Freq: Every day | RESPIRATORY_TRACT | Status: DC
  Administered 2016-11-16 – 2016-11-18 (×3): 1 via RESPIRATORY_TRACT
  Filled 2016-11-15: qty 28

## 2016-11-15 MED ORDER — UMECLIDINIUM BROMIDE 62.5 MCG/INH IN AEPB
1.0000 | INHALATION_SPRAY | Freq: Every day | RESPIRATORY_TRACT | Status: DC
  Administered 2016-11-15 – 2016-11-16 (×2): 1 via RESPIRATORY_TRACT
  Filled 2016-11-15 (×2): qty 7

## 2016-11-15 MED ORDER — IPRATROPIUM-ALBUTEROL 0.5-2.5 (3) MG/3ML IN SOLN
3.0000 mL | Freq: Three times a day (TID) | RESPIRATORY_TRACT | Status: DC
  Administered 2016-11-16: 3 mL via RESPIRATORY_TRACT
  Filled 2016-11-15: qty 3

## 2016-11-15 MED ORDER — ACETAMINOPHEN 500 MG PO TABS
1000.0000 mg | ORAL_TABLET | Freq: Four times a day (QID) | ORAL | Status: DC | PRN
  Administered 2016-11-17: 1000 mg via ORAL
  Filled 2016-11-15: qty 2

## 2016-11-15 NOTE — Patient Outreach (Signed)
[facility]) Care Management  11/15/2016  [name] 06-Mar-1956 [id number]   Noted that member was admitted to hospital yesterday.  Hospital liaisons notified, request made to discuss palliative care as inpatient as the discussion was started in the home.  Will follow up after discharge.  Valente David, [place], MSN [place] [phone]

## 2016-11-15 NOTE — Telephone Encounter (Signed)
[facility] called yesterday afternoon to make  Dr Prescott Gum aware that she had called EMS to transport patient to [facility]. She drained [name]'s PleurX today, only getting about 3 cc's.  Kasen was very shaky and after walking her PO2 stats dropped to 67%. And she just really looked bad. Mrs Beringer is currently admitted to [facility].

## 2016-11-15 NOTE — Consult Note (Signed)
[facility] CM Inpatient Consult   11/15/2016  [name] 1956/02/08 [id number]   Made aware of the patient that is active with [facility] Management. Patient has been active with [facility] Management Coordinator and [facility] Social Worker.  Will follow for progression, disposition, and needs as appropriate.  For questions, please contact:  Natividad Brood, RN BSN [facility] Liaison  [phone] business mobile phone Toll free office [phone]

## 2016-11-15 NOTE — Progress Notes (Signed)
[facility]  Patient Status: Active (receiving services up to time of hospitalization)  [facility] is providing the following services: RN  If patient discharges after hours, please call [phone].   [name] 11/15/2016, 10:05 AM

## 2016-11-15 NOTE — Progress Notes (Signed)
   Subjective: Patient seen and examined. She states her breathing has improved since admission. She states that her PleurX catheter has not been putting out much fluid and yesterday their was a small amount of fluid that was bloody. No acute events overnight  Objective:  Vital signs in last 24 hours: Vitals:   11/15/16 0231 11/15/16 0329 11/15/16 0500 11/15/16 0722  BP:  (!) 106/49  (!) 115/52  Pulse:  88  (!) 118  Resp:  17  13  Temp:  98.2 F (36.8 C)  98.1 F (36.7 C)  TempSrc:  Oral  Oral  SpO2: 97% 92%  94%  Weight:   266 lb 8.6 oz (120.9 kg)   Height:       Physical Exam  Constitutional: She is oriented to person, place, and time. She appears well-developed and well-nourished.  HENT:  Head: Normocephalic and atraumatic.  Eyes: Pupils are equal, round, and reactive to light. Conjunctivae and EOM are normal.  Neck: Normal range of motion. Neck supple.  Cardiovascular: Normal rate, regular rhythm and normal heart sounds.   Pulmonary/Chest: No respiratory distress. She has decreased breath sounds in the left lower field. She has no wheezes.  [place] 6L.  Musculoskeletal: Normal range of motion. She exhibits edema.  Neurological: She is alert and oriented to person, place, and time.  Skin: Skin is warm and dry.  Psychiatric: She has a normal mood and affect. Her behavior is normal.    Assessment/Plan:  Active Problems:   Acute respiratory failure (HCC)   Acute respiratory failure (HCC) Unclear etiology of acute respiratory failure. Primary respiratory acidosis, with secondary metabolic alkalosis. Most likely secondary to COPD exacerbation vs CHF exacerbation. Previous ECHO 04/7791 systolic function was normal with estimated ejectionfraction 55% to 90%, grade 1 diastolic dysfunction. Refused Lasix in the ED.  -Continue Prednisone 40 mg  -Duonebs q6 hours, Proventil neb q4 PRN -Trelegy 1 puff once daily -No abx treatment for now -Continue oxygenation with [place] keeping O2  saturation between 88-92%. -Follow up BNP -follow up repeat ABG -CT Chest scan w/o contrast   HLD, CAD -Continue Crestor 20 mg daily -Aspirin 81 mg daily  GERD -Pantoprazole 40 mg daily  CKD  Creatinine 2.40, patient's baseline ~2, GFr 24 -Continue to monitor  -Holding fluids for now  Dispo: Anticipated discharge in approximately 1-2 days.  Melanee Spry, MD 11/15/2016, 11:29 AM Pager: [phone]

## 2016-11-15 NOTE — Telephone Encounter (Signed)
error 

## 2016-11-16 ENCOUNTER — Other Ambulatory Visit: Payer: Self-pay | Admitting: Licensed Clinical Social Worker

## 2016-11-16 DIAGNOSIS — C3492 Malignant neoplasm of unspecified part of left bronchus or lung: Secondary | ICD-10-CM

## 2016-11-16 DIAGNOSIS — J90 Pleural effusion, not elsewhere classified: Secondary | ICD-10-CM

## 2016-11-16 MED ORDER — LIDOCAINE HCL (PF) 1 % IJ SOLN
30.0000 mL | Freq: Once | INTRAMUSCULAR | Status: DC
  Filled 2016-11-16: qty 30

## 2016-11-16 MED ORDER — MORPHINE SULFATE (PF) 4 MG/ML IV SOLN
2.0000 mg | Freq: Once | INTRAVENOUS | Status: AC
  Administered 2016-11-16: 2 mg via INTRAVENOUS
  Filled 2016-11-16: qty 1

## 2016-11-16 MED ORDER — IPRATROPIUM-ALBUTEROL 0.5-2.5 (3) MG/3ML IN SOLN
3.0000 mL | Freq: Two times a day (BID) | RESPIRATORY_TRACT | Status: DC
  Administered 2016-11-16 – 2016-11-18 (×4): 3 mL via RESPIRATORY_TRACT
  Filled 2016-11-16 (×4): qty 3

## 2016-11-16 NOTE — Progress Notes (Addendum)
[address]       [place] [address]             [phone]        Procedure: Pleurx catheter removal  Pre-procedure diagnosis: Left recurrent pleural effusion  Verbal and Written consent was obtained  Procedure: The patient's skin was prepped with chlora prep sticks and draped with sterile blue towels. The skin was anestizied with 384m of 1% lidocaine solution. The stitch had already been removed. We then made a small incision in line with the pleurx catheter. We dissected around the tube and eventually found the cuff. We used a 15 blade to release the band of tissue around the entire tube. The patient was given another 176mof 1% lidocaine solution. After more dissection with a hemostat the cuff was free and the pleurx tube was pulled out without resistance. [id number] Morphine IV were ordered for pain. Three stitched were placed with sterile gauze dressing and tape.  The patient tolerated the procedure well.   Post-procedure diagnosis: Left recurrent pleural effusion  Chest xray was ordered in the morning. She will need her sutures removed on 11/30/2016 at 10:00am.   TesNicholes RoughA-C

## 2016-11-16 NOTE — Consult Note (Signed)
[facility] CM Inpatient Consult   11/16/2016  [name] 09/25/1955 [id number]   Made aware of hospitalization by [facility] RNCM. Please see chart review then encounters for further patient outreach details.   Confirmed that [name] recently enrolled with [facility] outpatient home based palliative care program. Left voicemail for [name] at [facility] division at [phone] to make aware of hospitalization.  Spoke with [name] at bedside who endorses that she just "received some bad news. My cancer is back".States she has an upcoming appointment with Dr. Earlie Server and plans on discussing further with him. [name] states she is not ready to disclose these recent developments with her family. Support and encouragement given.   [name] endorses that she was recently contacted by [facility] program. States she is agreeable to ongoing follow up with them. Discussed that [facility] will not follow if she is active with outpatient palliative program. She expresses understanding of this.   Spoke with inpatient RNCM to make aware of all of above.   Will update [facility] team.   Marthenia Rolling, [place], RN,BSN [facility] Liaison [phone]

## 2016-11-16 NOTE — Patient Outreach (Addendum)
[facility]) Care Management  11/16/2016  [name] March 09, 1956 [id number]   CSW is aware of patient's current hospital admission. CSW received in basket message from [place] with update in regards to palliative care referral. Patient was enrolled with [facility] outpatient home program. CSW will continue to follow and await for discharge.  Eula Fried, BSW, MSW, [email or web address] Phone: [phone] Fax: [phone]

## 2016-11-16 NOTE — Progress Notes (Signed)
Called to pt room, pt reports chest tightness. VS stable, no complaints of pain. 12 lead EKG obtained and placed in chart. EKG unremarkable.  MD notified ([facility] PM coverage). No new orders at this time. Will continue to monitor.

## 2016-11-16 NOTE — Progress Notes (Signed)
Subjective: Patient seen and examined. She states her shortness of breath is about the same as yesterday. She had an episode of chest tightness overnight. EKG was repeated and showed sinus rhythm no ST elevations or acute ischemia. Night team saw and evaluated the patient, vitals remained stable and the patient was in no acute distress. CP resolved.   Objective:  Vital signs in last 24 hours: Vitals:   11/16/16 0447 11/16/16 0714 11/16/16 0800 11/16/16 0853  BP:   (!) 147/91   Pulse:   98   Resp:   18   Temp:  98.1 F (36.7 C)    TempSrc:  Oral    SpO2:   92% 90%  Weight: 265 lb 10.5 oz (120.5 kg)     Height:       Physical Exam  Constitutional: She is oriented to person, place, and time. She appears well-developed and well-nourished.  HENT:  Head: Normocephalic and atraumatic.  Eyes: Pupils are equal, round, and reactive to light. Conjunctivae and EOM are normal.  Neck: Normal range of motion. Neck supple.  Cardiovascular: Normal rate, regular rhythm and normal heart sounds.   Pulmonary/Chest: No respiratory distress. She has decreased breath sounds in the left lower field. She has no wheezes.  [place] 6L.  Musculoskeletal: Normal range of motion. She exhibits edema.  Neurological: She is alert and oriented to person, place, and time.  Skin: Skin is warm and dry.  Psychiatric: She has a normal mood and affect. Her behavior is normal.    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 Primary respiratory acidosis, with secondary metabolic alkalosis. Most likely secondary to COPD exacerbation vs CHF exacerbation. Previous ECHO 12/6644 systolic function was normal with estimated ejectionfraction 55% to 60%, grade 1 diastolic dysfunction, BNP 190. CT chest showed Left upper lobe pulmonary nodule measured previously at 11  mm now measures 14 mm. 7 mm left apical nodule on the prior study is now 12 mm. Interval progression of volume loss in the left hemithorax left lower lung collapse/consolidation is similar to prior. Left pleural effusion stable to minimally smaller with left pleural drain visualized. We discussed these results with the patient.  -Continue Prednisone 40 mg  -Duonebs q6 hours, Proventil neb q4 PRN  -Trelegy 1 puff once daily -No abx treatment for now -Continue oxygenation with [place] keeping O2 saturation between 88-92%. -Patient has a follow up appointment with Oncologist Dr. Julien Nordmann in one week.   HLD, CAD -Continue Crestor 20 mg daily -Aspirin 81 mg daily  GERD -Pantoprazole 40 mg daily  CKD  Creatinine 2.40, patient's baseline ~2, GFr 24 -Continue to monitor  -Holding fluids for now  Dispo: Anticipated discharge in approximately 1-2 days.  Melanee Spry, MD 11/16/2016, 10:39 AM Pager: [phone]

## 2016-11-16 NOTE — Progress Notes (Signed)
Evaluated ms Moret due to pt having one episode of chest tightness that resolved spontaneously.  Pt was sleeping comfortably when I arrived in room O2 saturation was 95% on 4.5L HFNC, pt no longer complains of chest tightness O2 requirements decreasing.  Repeat ECG shows sinus rhythm with normal rate, no ST changes, normal intervals and axis.  Pts vitals within normal limits.

## 2016-11-16 NOTE — Progress Notes (Signed)
Medicine attending: I examined this patient today together with resident physician Dr. Rochele Pages and I concur with her evaluation and management plan which we discussed together.  Respiratory status has stabilized.  Oxygenation better although follow-up arterial blood gas shows persistent, severe, hypercarbia. CT scan of the chest reviewed.  2 small nodules left upper lobe noted on prior study are definitely enlarging. No significant change in chronic left lower lung collapse and hard to determine whether there is any residual pleural effusion but clinically she is no longer able to easily drain fluid through the pleural catheter. Impression: 1.  Acute on chronic hypoxic and hypercarbic respiratory failure in a lady with obesity sleep apnea syndrome and oxygen dependent obstructive airway disease further complicated by full dose radiation treatment to a adenocarcinoma of the left lung. 2.  Persistent exudative left pleural effusion felt likely malignant but repetitive cytologies negative.  She recently required placement of a left pleural catheter for symptomatic control. 3.  Stage IV renal insufficiency 4.  Adenocarcinoma left lung initial stage II now local metastatic progression within the lung.  We discussed the CT scan findings with the patient especially with respect to the slowly progressing lung nodules.  We went back and looked at her initial molecular profile.  She is EGFR and ALK negative.  Treatment options at this point would include either chemotherapy or an immune checkpoint inhibitor.  Her multiple comorbid medical conditions may prohibit any additional aggressive treatment. She does have follow-up scheduled with her oncologist for next Friday.

## 2016-11-17 ENCOUNTER — Inpatient Hospital Stay (HOSPITAL_COMMUNITY): Payer: Medicare HMO

## 2016-11-17 ENCOUNTER — Encounter (HOSPITAL_COMMUNITY)
Admission: EM | Disposition: A | Discharge status: Home Health | Payer: Self-pay | Source: Home / Self Care | Attending: Oncology

## 2016-11-17 DIAGNOSIS — Z4682 Encounter for fitting and adjustment of non-vascular catheter: Secondary | ICD-10-CM

## 2016-11-17 DIAGNOSIS — I509 Heart failure, unspecified: Secondary | ICD-10-CM

## 2016-11-17 DIAGNOSIS — J9601 Acute respiratory failure with hypoxia: Secondary | ICD-10-CM

## 2016-11-17 LAB — BASIC METABOLIC PANEL
ANION GAP: 6 (ref 5–15)
BUN: 21 mg/dL — ABNORMAL HIGH (ref 6–20)
CALCIUM: 8.4 mg/dL — AB (ref 8.9–10.3)
CO2: 38 mmol/L — ABNORMAL HIGH (ref 22–32)
CREATININE: 2.56 mg/dL — AB (ref 0.44–1.00)
Chloride: 98 mmol/L — ABNORMAL LOW (ref 101–111)
GFR, EST AFRICAN AMERICAN: 22 mL/min — AB (ref >60)
GFR, EST NON AFRICAN AMERICAN: 19 mL/min — AB (ref >60)
Glucose, Bld: 105 mg/dL — ABNORMAL HIGH (ref 65–99)
Potassium: 4.2 mmol/L (ref 3.5–5.1)
Sodium: 142 mmol/L (ref 135–145)

## 2016-11-17 LAB — CBC
HCT: 28.6 % — ABNORMAL LOW (ref 36.0–46.0)
HEMOGLOBIN: 8.4 g/dL — AB (ref 12.0–15.0)
MCH: 28.5 pg (ref 26.0–34.0)
MCHC: 29.4 g/dL — AB (ref 30.0–36.0)
MCV: 96.9 fL (ref 78.0–100.0)
PLATELETS: 274 10*3/uL (ref 150–400)
RBC: 2.95 MIL/uL — AB (ref 3.87–5.11)
RDW: 14.4 % (ref 11.5–15.5)
WBC: 10.2 10*3/uL (ref 4.0–10.5)

## 2016-11-17 SURGERY — REMOVAL, CLOSED DRAINAGE CATHETER SYSTEM, PLEURAL
Anesthesia: Monitor Anesthesia Care | Laterality: Left

## 2016-11-17 MED ORDER — ENOXAPARIN SODIUM 30 MG/0.3ML ~~LOC~~ SOLN
30.0000 mg | SUBCUTANEOUS | Status: DC
  Administered 2016-11-17: 30 mg via SUBCUTANEOUS
  Filled 2016-11-17: qty 0.3

## 2016-11-17 MED ORDER — TRAMADOL HCL 50 MG PO TABS: 50.0000 mg | ORAL_TABLET | Freq: Once | ORAL | Status: DC

## 2016-11-17 NOTE — Progress Notes (Signed)
SATURATION QUALIFICATIONS: (This note is used to comply with regulatory documentation for home oxygen)  Patient Saturations on Room Air at Rest = NT as pt on HFNC 6L at rest  Patient Saturations on Room Air while Ambulating = NT as not appropriate  Patient Saturations on 6 Liters of HFNC oxygen while Transferring = 86-90%  Please briefly explain why patient needs home oxygen:Pt will need home O2.  Thanks.  [place] [phone] (pager)

## 2016-11-17 NOTE — Progress Notes (Addendum)
   Subjective: Patient seen and examined. She states her shortness of breath has been slowly improving since admission. She had no acute events over night. She had her PleurX catheter removed yesterday and tolerated the procedure well. She expressed concern about telling her children that growing nodules were found in her lung. Reassured the patient that we will help with setting up any services she may need at home. She was agreeable to discharge tomorrow night.   Objective:  Vital signs in last 24 hours: Vitals:   11/17/16 0453 11/17/16 0722 11/17/16 0730 11/17/16 1203  BP:    (!) 105/58  Pulse:  94  (!) 103  Resp:  17  19  Temp:    98.3 F (36.8 C)  TempSrc:    Oral  SpO2:  90% 94% 92%  Weight: 265 lb 6.9 oz (120.4 kg)     Height:       Physical Exam  Constitutional: She is oriented to person, place, and time. She appears well-developed and well-nourished.  HENT:  Head: Normocephalic and atraumatic.  Eyes: Pupils are equal, round, and reactive to light. Conjunctivae and EOM are normal.  Neck: Normal range of motion. Neck supple.  Cardiovascular: Normal rate, regular rhythm and normal heart sounds.   Pulmonary/Chest: Effort normal. She has decreased breath sounds in the left lower field.  Musculoskeletal: Normal range of motion.  Neurological: She is alert and oriented to person, place, and time.  Skin: Skin is warm and dry.  Psychiatric: She has a normal mood and affect. Her behavior is normal.    Assessment/Plan:  Active Problems:   Acute respiratory failure (HCC)  Hypercarbic acute on chronic respiratory failure  -Continue Prednisone 40 mg  -Duonebs q6 hours, Proventil neb q4 PRN  -Trelegy 1 puff once daily -Continue oxygenation with [place] keeping O2 saturation between 88-92%. -PT/OT evaluation   HLD, CAD -Continue Crestor 20 mg daily -Aspirin 81 mg daily  CKD  Creatinine 2.56, patient's baseline ~2, GFr 19 -Continue to monitor  -Holding fluids for now  Dispo:  Anticipated discharge in approximately 1 day.  Melanee Spry, MD 11/17/2016, 12:59 PM Pager: [phone]

## 2016-11-17 NOTE — Progress Notes (Signed)
Internal Medicine Attending:   I saw and examined the patient. I reviewed the resident's note and I agree with the resident's findings and plan as documented in the resident's note.  61 year old woman admitted with acute on chronic hypercarbic respiratory failure. Respiratory status is improving nicely with systemic prednisone. Cardiothoracic surgery was able to remove the nonfunctioning left sided Pleurx catheter. This morning the patient's was somewhat depressed after hearing that she likely has a recurrence of her known lung cancer, it seems to have metastasized to the left upper lobes. Patient tells [place] that she will follow up with her oncologist but does not feel strong enough to undergo further lung cancer treatments and is looking forward to involving the home outpatient palliative care team. She is interested in receiving more support at home because currently she lives with two grandchildren. Plan is to work with PT and OT today. We will try to maximize home health and equipment. Likely discharge to home tomorrow. Will finish up a short burst of prednisone

## 2016-11-17 NOTE — Progress Notes (Signed)
Pt c/o Vertigo - Pt says that years ago she had an adjustment of her inner ear crystals that improved her vertigo Pt says its too bad to walk in the hall but can still read the newspaper

## 2016-11-17 NOTE — Evaluation (Signed)
Physical Therapy Evaluation Patient Details Name: TEAL RABEN MRN: [id number] DOB: 19-Nov-1955 Today's Date: 11/17/2016   History of Present Illness  Ms. Toren is a 61 year old female with a past medical history significant for lung adenocarcinoma (s/p curative RT to LLL in 02/2015) with recurrent left sided pleural effusions, COPD on 4L home oxygen, pulmonary HTN, HFpEF, CAD, diabetes mellitus type 2, HTN presenting to [facility] ED with complaints of shortness of breath.  Also c/o vertigo.   Past Medical History:  Diagnosis Date  . Arthritis    "some scattered" (03/03/2015)  . CHF (congestive heart failure) ([place])   . Chronic bronchitis ([place])    "get it most years" (03/03/2015)  . COPD (chronic obstructive pulmonary disease) (HCC)    Severe. Gold Stage IV.  PFTs (12/2008) - severe obstructive airway disease. Active tobacco use. Requires 4L O2 at home.  . Coronary artery disease    S/P PCI of LAD with DES (12/2008). Total occlusion of RCA noted at that time., medically managed. ACS ruled out 03/2009 with Lexiscan myoview . Followed by [facility].  . Depression   . Diabetes mellitus without complication ([place])   . Diastolic dysfunction    2-D Echo (12/2008) - Normal LV Systolic funciton with EF 60-65%. Grade 1 diastolid dysfunction. No regional wall motion abnormalities. Moderate pulmonary HTN with PA peak pressure 24mHg.  . Full dentures   . GERD (gastroesophageal reflux disease)    S/P Nissen fundoplication.  .Marland KitchenHistory of hiatal hernia   . Hx MRSA infection    Recurrent MRSA thigh abscesses.  . Hyperlipidemia   . Lung cancer [place]    "left"  . Obesity   . On home oxygen therapy since 2010   4L all the time  . Pneumonia   . Prediabetes    HgbA1c 6.4 (12/2008)  . Pulmonary hypertension ([place]    2-D Echo (036/6440 - Systolic pressure was moderately increased. PA peak pressure  563mg. secondary pulm htn likely on basis of comb of interstital lung disease, severe copd, small airways disease,  severe sleep apnea and cor pulmonale,. Followed by Dr. WrJoya GaskinsLVelora Heckler . Pulmonary nodule, right    Small right middle lobe nodule. Stable as of 12/2008.  . Marland Kitchenhortness of breath dyspnea    with a lot of exertion; if fluid builds up    Clinical Impression  Pt admitted with above diagnosis. Pt currently with functional limitations due to the deficits listed below (see PT Problem List). Pt was positive for left BPPV.  Treated with canalith repositioning maneuver with pt less dizzy after treatment.  Will check back tomorrow with pt.  Pt desats with bed flat as well as with any movement. Pt is on 6LHFNC.  Will need continued home O2.    Pt will benefit from skilled PT to increase their independence and safety with mobility to allow discharge to the venue listed below.      Follow Up Recommendations Home health PT;Supervision/Assistance - 24 hour (vestibular rehab)    Equipment Recommendations  Hospital bed;Wheelchair (22x18 lightweight(antitippers,footrests, deskarmrests) );Wheelchair cushion (22x18 pressure relieving)    Recommendations for Other Services       Precautions / Restrictions Precautions Precautions: Fall Restrictions Weight Bearing Restrictions: No      Mobility  Bed Mobility Overal bed mobility: Independent                Transfers Overall transfer level: Independent  General transfer comment: Stood at EOB to move toHOB.  Ambulation/Gait             General Gait Details: NT due to vertigo assessment completed.  Stairs            Wheelchair Mobility    Modified Rankin (Stroke Patients Only)       Balance Overall balance assessment: Needs assistance Sitting-balance support: No upper extremity supported;Feet supported Sitting balance-Leahy Scale: Good     Standing balance support: Bilateral upper extremity supported;During functional activity Standing balance-Leahy Scale: Poor Standing balance comment: relies on  holding onto something when standing                             Pertinent Vitals/Pain Pain Assessment: No/denies pain   SATURATION QUALIFICATIONS: (This note is used to comply with regulatory documentation for home oxygen)  Patient Saturations on Room Air at Rest = NT as pt on HFNC 6L at rest  Patient Saturations on Room Air while Ambulating = NT as not appropriate  Patient Saturations on 6 Liters of HFNC oxygen while Transferring = 86-90%  Please briefly explain why patient needs home oxygen:Pt will need home O2.  Home Living Family/patient expects to be discharged to:: Private residence Living Arrangements: Other relatives (33 year old grandson) Available Help at Discharge: Family;Available PRN/intermittently Type of Home: Apartment Home Access: Level entry     Home Layout: One level Home Equipment: Walker - 2 wheels;Shower seat;Hand held Tourist information centre manager - 4 wheels;Bedside commode (home O2)      Prior Function Level of Independence: Independent;Independent with assistive device(s)         Comments: states she uses rollator when she goes out     Hand Dominance   Dominant Hand: Right    Extremity/Trunk Assessment   Upper Extremity Assessment Upper Extremity Assessment: Defer to OT evaluation    Lower Extremity Assessment Lower Extremity Assessment: Generalized weakness    Cervical / Trunk Assessment Cervical / Trunk Assessment: Normal  Communication   Communication: No difficulties  Cognition Arousal/Alertness: Awake/alert Behavior During Therapy: Flat affect Overall Cognitive Status: Within Functional Limits for tasks assessed                                        General Comments General comments (skin integrity, edema, etc.): Pt with positive test for left BPPV - had to modify treatment of BPPV due to pt could not lie flat.  Used bed to get positions the best way possible.  At end of treatment pt noted that dizziness had  lessened from 7/10 initially to 1/10 at end of treatment.     Exercises     Assessment/Plan    PT Assessment Patient needs continued PT services  PT Problem List Decreased activity tolerance;Decreased balance;Decreased mobility;Decreased knowledge of use of DME;Decreased safety awareness;Decreased knowledge of precautions;Other (comment) (dizziness)       PT Treatment Interventions DME instruction;Gait training;Stair training;Functional mobility training;Therapeutic activities;Therapeutic exercise;Balance training;Patient/family education    PT Goals (Current goals can be found in the Care Plan section)  Acute Rehab PT Goals Patient Stated Goal: to get better PT Goal Formulation: With patient Time For Goal Achievement: 12/01/16 Potential to Achieve Goals: Good    Frequency Min 3X/week   Barriers to discharge        Co-evaluation  AM-PAC PT "6 Clicks" Daily Activity  Outcome Measure Difficulty turning over in bed (including adjusting bedclothes, sheets and blankets)?: None Difficulty moving from lying on back to sitting on the side of the bed? : None Difficulty sitting down on and standing up from a chair with arms (e.g., wheelchair, bedside commode, etc,.)?: A Little Help needed moving to and from a bed to chair (including a wheelchair)?: A Little Help needed walking in hospital room?: A Lot Help needed climbing 3-5 steps with a railing? : Total 6 Click Score: 17    End of Session Equipment Utilized During Treatment: Oxygen Activity Tolerance: Patient limited by fatigue Patient left: in bed;with call bell/phone within reach Nurse Communication: Mobility status PT Visit Diagnosis: BPPV;Muscle weakness (generalized) (M62.81) BPPV - Right/Left : Left    Time: 8127-5170 PT Time Calculation (min) (ACUTE ONLY): 24 min   Charges:   PT Evaluation $PT Eval Moderate Complexity: 1 Mod PT Treatments $Canalith Rep Proc: 8-22 mins   PT G Codes:         Jalyric Kaestner,PT Acute Rehabilitation [phone] (pager)   Denice Paradise 11/17/2016, 2:39 PM

## 2016-11-17 NOTE — Discharge Summary (Signed)
Name: [name] MRN: [id number] DOB: 1955/04/14 61 y.o. PCP: Oval Linsey, MD  Date of Admission: 11/14/2016  1:39 PM Date of Discharge: 11/18/2016 Attending Physician: Axel Filler, *  Discharge Diagnosis: 1. Hypercarbic Acute on Chronic Respiratory Failure Active Problems:   Acute respiratory failure [facility])   Discharge Medications: Allergies as of 11/18/2016      Reactions   Fluconazole Anaphylaxis, Itching   Atorvastatin Other (See Comments)   Dizziness       Medication List    TAKE these medications   acetaminophen-codeine 300-30 MG tablet Commonly known as:  TYLENOL #3 Take 1 tablet by mouth every 6 (six) hours as needed for severe pain.   albuterol 108 (90 Base) MCG/ACT inhaler Commonly known as:  VENTOLIN HFA Inhale 1-2 puffs into the lungs every 6 (six) hours as needed for shortness of breath.   aspirin 81 MG chewable tablet Chew 1 tablet (81 mg total) by mouth at bedtime.   buPROPion 150 MG 12 hr tablet Commonly known as:  WELLBUTRIN SR Take 1 tablet (150 mg total) by mouth 2 (two) times daily.   fluticasone furoate-vilanterol 100-25 MCG/INH Aepb Commonly known as:  BREO ELLIPTA Inhale 1 puff into the lungs daily.   Fluticasone-Umeclidin-Vilant 100-62.5-25 MCG/INH Aepb Commonly known as:  TRELEGY ELLIPTA Inhale 1 puff into the lungs daily.   furosemide 40 MG tablet Commonly known as:  LASIX Take 1 tablet (40 mg total) by mouth daily.   gabapentin 300 MG capsule Commonly known as:  NEURONTIN Take 2 capsules (600 mg total) by mouth at bedtime.   ipratropium-albuterol 0.5-2.5 (3) MG/3ML Soln Commonly known as:  DUONEB USE 3 ML VIA NEBULIZER EVERY 6 HOURS AS NEEDED FOR SHORTNESS OF BREATH What changed:  Another medication with the same name was added. Make sure you understand how and when to take each.   ipratropium-albuterol 0.5-2.5 (3) MG/3ML Soln Commonly known as:  DUONEB Take 3 mLs by nebulization 2 (two) times daily. What  changed:  You were already taking a medication with the same name, and this prescription was added. Make sure you understand how and when to take each.   metolazone 2.5 MG tablet Commonly known as:  ZAROXOLYN TAKE 1 TABLET BY MOUTH AS NEEDED(FOR 5 LBS WEIGHT GAIN) What changed:  See the new instructions.   pantoprazole 40 MG tablet Commonly known as:  PROTONIX Take 1 tablet (40 mg total) by mouth daily.   polyethylene glycol packet Commonly known as:  MIRALAX / GLYCOLAX Take 17 g by mouth daily as needed for moderate constipation or severe constipation.   predniSONE 20 MG tablet Commonly known as:  DELTASONE Take 2 tablets (40 mg total) by mouth daily with breakfast.   ranitidine 300 MG tablet Commonly known as:  ZANTAC Take 1 tablet (300 mg total) by mouth at bedtime.   rosuvastatin 20 MG tablet Commonly known as:  CRESTOR Take 1 tablet (20 mg total) by mouth daily.   senna 8.6 MG Tabs tablet Commonly known as:  SENOKOT Take 2 tablets (17.2 mg total) by mouth daily as needed for mild constipation or moderate constipation.   VITAMIN B-6 PO Take 1 tablet by mouth daily.            Durable Medical Equipment        Start     Ordered   11/18/16 1200  For home use only DME lightweight manual wheelchair with seat cushion  (Wheelchairs)  Once    Comments:  Patient  suffers from COPD which impairs their ability to perform daily activities like bathing, dressing, feeding, grooming and toileting in the home.  A walker will not resolve  issue with performing activities of daily living. A wheelchair will allow patient to safely perform daily activities. Patient is not able to propel themselves in the home using a standard weight wheelchair due to endurance. Patient can self propel in the lightweight wheelchair.  Accessories: elevating leg rests (ELRs), wheel locks, extensions and anti-tippers.   11/18/16 1213   11/18/16 0000  DME Hospital bed    Question:  Bed type  Answer:   Semi-electric   11/18/16 1213      Disposition and follow-up:   MorganMorgan [name] was discharged from [facility] in Stable condition.  At the hospital follow up visit please address:  1.  Oncology follow up, home oxygen requirements  2.  Labs / imaging needed at time of follow-up: None  3.  Pending labs/ test needing follow-up: None  Follow-up Appointments: Follow-up Information    Ivin Poot, MD Follow up.   Specialty:  Cardiothoracic Surgery Why:  Your appointment for suture removal is on 8/29/2018at 10:00am. Contact information: [address] [place] [address] [place] Hospital Course by problem list: Active Problems:   Acute respiratory failure ([place])   1. Hypercarbic Acute on Chronic Respiratory Failure [name] was admitted to [facility] and the Internal Medicine teaching service due to acute worsening shortness of breath at rest. The patient is on 4 liters of oxygen at home and had tried albuterol treatments with no improvement in her symptoms. During her hospitalization, she was treated for a COPD exacerbation with prednisone and Duoneb breathing treatments. Appropriate oxygen saturation was maintained on 4-6 Liters of oxygen.    CT scan of the chest was also performed and showed progression of left upper lung pulmonary nodules from a previous scan in May 2018.  The patient was informed of these results and has a follow up appointment scheduled with her Oncologist. [name] also had her PleurX catheter removed by cardiothoracic surgery. She tolerated the procedure well.  She was evaluated by physical therapy who recommended home health physical therapy.   Discharge Vitals:   BP 107/67   Pulse 99   Temp (!) 97.5 F (36.4 C) (Oral)   Resp 19   Ht _0  (1.651 m)   Wt 266 lb 8.6 oz (120.9 kg)   SpO2 92%   BMI 44.35 kg/m   Pertinent Labs, Studies, and Procedures:  Arterial Blood Gas result on  admission:  pO2 53; pCO2 76.5; pH 7.305;  HCO3 38.1, %O2 Sat 82.0.  Arterial Blood Gas result hospital day 1: pO2 82; pCO2 76.1; pH 7.303;  HCO3 36.7, %O2 Sat 96.3.  CT CHEST WO CONTRAST  IMPRESSION: 1. Interval progression of left upper lung pulmonary nodules. 2. Stable volume loss left hemithorax with stable to minimally decreased left pleural fluid collection with pleural drain visualized in situ. 3. Left base collapse/consolidation, similar to prior.  BMP Latest Ref Rng & Units 11/18/2016 11/17/2016 11/15/2016  Glucose 65 - 99 mg/dL 172(H) 105(H) 223(H)  BUN 6 - 20 mg/dL 22(H) 21(H) 12  Creatinine 0.44 - 1.00 mg/dL 2.40(H) 2.56(H) 2.40(H)  BUN/Creat Ratio 12 - 28 - - -  Sodium 135 - 145 mmol/L 142 142 140  Potassium 3.5 - 5.1 mmol/L 4.4 4.2 4.8  Chloride 101 -  111 mmol/L 98(L) 98(L) 96(L)  CO2 22 - 32 mmol/L 38(H) 38(H) 40(H)  Calcium 8.9 - 10.3 mg/dL 8.5(L) 8.4(L) 8.5(L)   Discharge Instructions: Discharge Instructions    Call MD for:  difficulty breathing, headache or visual disturbances    Complete by:  As directed    Call MD for:  extreme fatigue    Complete by:  As directed    Call MD for:  persistant dizziness or light-headedness    Complete by:  As directed    DME Hospital bed    Complete by:  As directed    Bed type:  Semi-electric   Diet - low sodium heart healthy    Complete by:  As directed    Diet - low sodium heart healthy    Complete by:  As directed    Discharge instructions    Complete by:  As directed    [name] were admitted to [facility] for worsening shortness breath. We treated you with oral steroids and Duoneb treatments.   Please take 40 mg of Prednisone tomorrow, 11/19/2016, then stop   Discharge instructions    Complete by:  As directed    [name],   You were admitted for worsening shortness of breath at rest and with activity. During your hospitalization, we treated you with oral steroids and Duoneb breathing treatments.    You have one more day of oral steroids to complete your treatment. Please take two 20 mg tablets once with breakfast tomorrow morning 11/19/2016. This will complete your steroid treatment. Continue your home breathing treatments and inhalers.   Please request a follow up appointment with your primary care physician or the [facility] within one to two weeks of discharge.   Increase activity slowly    Complete by:  As directed    Increase activity slowly    Complete by:  As directed       Signed: Melanee Spry, MD 11/18/2016, 12:31 PM   Pager: [phone]

## 2016-11-18 ENCOUNTER — Other Ambulatory Visit: Payer: Self-pay | Admitting: Licensed Clinical Social Worker

## 2016-11-18 LAB — CBC
HCT: 28.9 % — ABNORMAL LOW (ref 36.0–46.0)
HEMOGLOBIN: 8.4 g/dL — AB (ref 12.0–15.0)
MCH: 27.7 pg (ref 26.0–34.0)
MCHC: 29.1 g/dL — ABNORMAL LOW (ref 30.0–36.0)
MCV: 95.4 fL (ref 78.0–100.0)
Platelets: 272 10*3/uL (ref 150–400)
RBC: 3.03 MIL/uL — AB (ref 3.87–5.11)
RDW: 14.1 % (ref 11.5–15.5)
WBC: 10.4 10*3/uL (ref 4.0–10.5)

## 2016-11-18 LAB — BASIC METABOLIC PANEL
ANION GAP: 6 (ref 5–15)
BUN: 22 mg/dL — ABNORMAL HIGH (ref 6–20)
CALCIUM: 8.5 mg/dL — AB (ref 8.9–10.3)
CO2: 38 mmol/L — AB (ref 22–32)
Chloride: 98 mmol/L — ABNORMAL LOW (ref 101–111)
Creatinine, Ser: 2.4 mg/dL — ABNORMAL HIGH (ref 0.44–1.00)
GFR calc non Af Amer: 21 mL/min — ABNORMAL LOW (ref >60)
GFR, EST AFRICAN AMERICAN: 24 mL/min — AB (ref >60)
Glucose, Bld: 172 mg/dL — ABNORMAL HIGH (ref 65–99)
Potassium: 4.4 mmol/L (ref 3.5–5.1)
Sodium: 142 mmol/L (ref 135–145)

## 2016-11-18 MED ORDER — IPRATROPIUM-ALBUTEROL 0.5-2.5 (3) MG/3ML IN SOLN: 3.0000 mL | Freq: Two times a day (BID) | RESPIRATORY_TRACT | 1 refills | Status: AC

## 2016-11-18 MED ORDER — PREDNISONE 20 MG PO TABS: 40.0000 mg | ORAL_TABLET | Freq: Every day | ORAL | 0 refills | Status: AC

## 2016-11-18 MED ORDER — FLUTICASONE FUROATE-VILANTEROL 100-25 MCG/INH IN AEPB: 1.0000 | INHALATION_SPRAY | Freq: Every day | RESPIRATORY_TRACT | 0 refills | Status: AC

## 2016-11-18 NOTE — Progress Notes (Addendum)
SATURATION QUALIFICATIONS: (This note is used to comply with regulatory documentation for home oxygen)  Patient Saturations on Room Air at Rest = 78  Patient Saturations on Room Air while Ambulating = 74  Patient Saturations on 4 Liters of oxygen while Ambulating = 88  Please briefly explain why patient needs home oxygen: Pt 's requirement with disease process likely recurrence of her lung cancer Hx of COPD & CHF

## 2016-11-18 NOTE — Progress Notes (Signed)
Pt notified me to tell me that now she thinks that she does not have a tank of 02 full enough to get her home. Call made to Sam Case Manager to make aware. She is making calls to Home health agency to see if they can bring a tank to the hospital so that she can discharge tonight

## 2016-11-18 NOTE — Progress Notes (Signed)
Pt discharged per w/c per NT with personal belongings Daughter taking pt home via private car

## 2016-11-18 NOTE — Progress Notes (Signed)
Went over all discharge instructions awaiting delivery of 02 tank

## 2016-11-18 NOTE — Care Management Note (Addendum)
Case Management Note  Patient Details  Name: IZABELA OW MRN: [id number] Date of Birth: 1956/02/27  Subjective/Objective:     Pt admitted with SOB               Action/Plan:   PTA from home independent.  Pt is on home oxygen supplied by [employer] 4 liters.  Pt mentioned to therapy the possibility of changing Oxygen companies.  CM spoke directly with pt; Pt was concerned that current home oxygen tank may not be working efficiently and that she was not able to get portable oxygen tanks from agency without transportation to [employer] .  CM contacted agency and confirmed that pt is active with agency and agency will continue to provide home oxygen.  CM confirmed with Yvetta Coder that portable oxygen tanks are delivered per request every 90 days - the last time Ms Varma requested portable tanks was back in Feb.  Yvetta Coder has arranged for portable tanks to be delivered to pts home later today in addition, a service tech from the agency will meet pt at the home immediately  post discharge between the hours of 6-9pm tonight (pt only needs to call agency once she arrives home).     Pt confirmed that she will transport home via private vehicle (daughter) and family will bring portable tank for discharge between 6 and 8pm.   Pt is in agreement to continue with [employer] as oxygen supplier.  CM spoke with attending service to request clarity with Home oxygen order as pt is now on 6 liters - attending informed CM that pt is chronic resp failure and baseline oxygen sats are in low to mid 80's - per service this is acceptable and pt will discharge back home on 4 liters.  CM faxed new order to [facility] with [employer] at [phone] along with H&P as requested.  Pt offered choice for HH and DME (other than oxygen) and pt chose [facility] - agency contacted and referral accepted.  Pt also informed CM that she also has [facility] oxygen equipment in the home that is functional.  Pt is also active with [facility] outpt  palliative program.  CM placed the number for pt to call for the service on the home equipment on the AVS     Expected Discharge Date:  11/18/16               Expected Discharge Plan:  [place]  In-House Referral:     Discharge planning Services  CM Consult  Post Acute Care Choice:    Choice offered to:  Patient  DME Arranged:  Oxygen, Wheelchair manual, Hospital bed [facility] for Wheelchair and bed only oxygen supplied by [employer]) DME Agency:  [place] Arranged:  RN, PT [facility] Agency:  [place]  Status of Service:  Completed, signed off  If discussed at [place] of Stay Meetings, dates discussed:    Additional Comments: 11/18/2016  1712: [place] with [employer] has agreed to deliver portable tank to pts room before 1900.     1700:  CM informed by bedside nurse that pt now states she doesn't have enough supply in home portable tanks for transport home.  CM contacted [employer] - liaison to contact CM regarding if it is possible to deliver portable tank today.    1510:  CM verified with [employer] that all required documentation was received Petr Bontempo, Abelino Derrick,  RN 11/18/2016, 2:05 PM

## 2016-11-18 NOTE — Consult Note (Addendum)
[facility] CM Inpatient Consult   11/18/2016  JAYLINN HELLENBRAND 24-Nov-1955 [id number]    [facility] Care Management follow up.  Spoke with [name] with [facility] outpatient palliative program division at [phone]. She confirms that Ms. Escamilla is still enrolled in their palliative care program.   Made [name] aware that Ms. Legore will be discharging soon from the hospital and wanted to be sure that Ms. Gwynn will continue to be followed by [facility] palliative program. [name] states she will send notification of pending hospital discharge to their NP for ongoing follow up.   Marthenia Rolling, MSN-Ed, RN,BSN [facility] Liaison [phone]

## 2016-11-18 NOTE — Discharge Instructions (Signed)
Call Norvel Richards at [place] at [phone] right when you get home for oxygen tanks delivery

## 2016-11-18 NOTE — Progress Notes (Signed)
Physical Therapy Treatment Patient Details Name: [name] MRN: [id number] DOB: 12-Jul-1955 Today's Date: 11/18/2016    History of Present Illness [name] is a 61 year old female with a past medical history significant for lung adenocarcinoma (s/p curative RT to LLL in 02/2015) with recurrent left sided pleural effusions, COPD on 4L home oxygen, pulmonary HTN, HFpEF, CAD, diabetes mellitus type 2, HTN presenting to [facility] ED with complaints of shortness of breath.  Also c/o vertigo.     PT Comments    Pt admitted with above diagnosis. Pt currently with functional limitations due to balance and endurance deficits. Pt reports that dizziness 2/10 and she does not want a repeat treatment today as she states she cannot tolerate it again this soon.  Recommend HHPT vestibular rehab and pt agrees.  Called CM but had to leave message as this PT wanted to discuss f/u and equipment with her.  Pt has concerns about her O2 as she states that she cannot get her O2 from [facility] anymore and would like to change to [employer] to get her O2.  CM:  Please assist pt in home needs.  Equipment and f/u recommended below as well. Pt will benefit from skilled PT to increase their independence and safety with mobility to allow discharge to the venue listed below.     Follow Up Recommendations  Home health PT;Supervision/Assistance - 24 hour (vestibular rehab)     Equipment Recommendations  Hospital bed;Wheelchair (measurements PT);Wheelchair cushion (measurements PT) (22x18 lightweight, antitippers,footrests, deskarmrests)    Recommendations for Other Services       Precautions / Restrictions Precautions Precautions: Fall Restrictions Weight Bearing Restrictions: No    Mobility  Bed Mobility Overal bed mobility: Independent                Transfers Overall transfer level: Independent               General transfer comment: Pt can stand and piviot to 3N1 without assist.   Ambulation/Gait              General Gait Details: Pt states she has walked and did not want to walk this am.  She is steady for transfers.   Stairs            Wheelchair Mobility    Modified Rankin (Stroke Patients Only)       Balance Overall balance assessment: Needs assistance Sitting-balance support: No upper extremity supported;Feet supported Sitting balance-Leahy Scale: Good     Standing balance support: During functional activity;No upper extremity supported Standing balance-Leahy Scale: Fair Standing balance comment: can stand statically up to 30 seconds without UE support                            Cognition Arousal/Alertness: Awake/alert Behavior During Therapy: Flat affect Overall Cognitive Status: Within Functional Limits for tasks assessed                                        Exercises      General Comments General comments (skin integrity, edema, etc.): Pt reports dizziness at 2/10.  She states it has resolved quite a bit since yesterdays treatment.  Pt reports she is not willing to repeat the treatment again today as she just can't go through it.  She reports she feels well enough.  Pertinent Vitals/Pain Pain Assessment: No/denies pain    Home Living                      Prior Function            PT Goals (current goals can now be found in the care plan section) Progress towards PT goals: Progressing toward goals    Frequency    Min 3X/week      PT Plan Current plan remains appropriate    Co-evaluation              AM-PAC PT "6 Clicks" Daily Activity  Outcome Measure  Difficulty turning over in bed (including adjusting bedclothes, sheets and blankets)?: None Difficulty moving from lying on back to sitting on the side of the bed? : None Difficulty sitting down on and standing up from a chair with arms (e.g., wheelchair, bedside commode, etc,.)?: A Little Help needed moving to and from a bed to chair  (including a wheelchair)?: A Little Help needed walking in hospital room?: A Lot Help needed climbing 3-5 steps with a railing? : Total 6 Click Score: 17    End of Session Equipment Utilized During Treatment: Oxygen Activity Tolerance: Patient limited by fatigue Patient left: in bed;with call bell/phone within reach (sitting on EOB) Nurse Communication: Mobility status PT Visit Diagnosis: BPPV;Muscle weakness (generalized) (M62.81) BPPV - Right/Left : Left     Time: 7322-5672 PT Time Calculation (min) (ACUTE ONLY): 10 min  Charges:  $Self Care/Home Management: 8-22                    G Codes:       [name],PT Acute Rehabilitation [phone]   Denice Paradise 11/18/2016, 9:41 AM

## 2016-11-18 NOTE — Progress Notes (Signed)
Subjective: Patient seen and examined. She states she is feeling back to her normal state of health. She was given tramadol over night for pain associated with PleurX catheter removal. She states she wants to go home today. No other complaints at this time.    Objective:  Vital signs in last 24 hours: Vitals:   11/17/16 2136 11/17/16 2336 11/17/16 2337 11/18/16 0324  BP: 119/74 120/72    Pulse: 90 99    Resp: 18 16    Temp: 98.8 F (37.1 C)  98.1 F (36.7 C) (!) 97.5 F (36.4 C)  TempSrc: Oral  Oral Oral  SpO2: (!) 88% (!) 88%    Weight:    266 lb 8.6 oz (120.9 kg)  Height:       Physical Exam  Constitutional: She is oriented to person, place, and time. She appears well-developed and well-nourished.  HENT:  Head: Normocephalic and atraumatic.  Eyes: Pupils are equal, round, and reactive to light. Conjunctivae and EOM are normal.  Neck: Normal range of motion. Neck supple.  Cardiovascular: Normal rate, regular rhythm and normal heart sounds.   Pulmonary/Chest: Effort normal. She has rhonchi (expiratory, upper and lower lung fields bilaterally).  Musculoskeletal: Normal range of motion.  Neurological: She is alert and oriented to person, place, and time.  Skin: Skin is warm and dry.  Psychiatric: She has a normal mood and affect. Her behavior is normal.    Assessment/Plan:  Active Problems:   Acute respiratory failure (HCC)  Hypercarbic acute on chronic respiratory failure  Secondary to COPD exacerbation and chronic left lung volume loss s/p radiation therapy. Tolerated decrease to home oxygen rate, 4 Liters, well. Stable for discharge. PT recommends home health PT, with hospital bed and wheelchair.  -Day 4 of Prednisone 40 mg. On discharge will continue Prednisone 40 mg one more day for a total of five days.   -Continue home breathing treatments and medications   Dispo: Anticipated discharge today.  Melanee Spry, MD 11/18/2016, 7:12 AM Pager: [phone]

## 2016-11-18 NOTE — Patient Outreach (Signed)
[place] [facility]) Care Management  11/18/2016  JENEA DAKE 08-Dec-1955 [id number]  Assessment- CSW received in basket staff message from [place]. Orrin Brigham spoke with [name] at [facility] outpatient palliative and confirmed that patient is enrolled in their program. [facility] CSW or RNCM will follow up post discharge to ensure that they are indeed following patient before completing discharge.  Plan-CSW will await for hospital discharge.  Eula Fried, BSW, MSW, [email or web address] Phone: [phone] Fax: [phone]

## 2016-11-18 NOTE — Progress Notes (Addendum)
For Saturation Qualifications Pt was off O2 for the briefiest time and the ambulation was only to [facility] - a short distance in the room not far from the bed

## 2016-11-18 NOTE — Care Management Important Message (Signed)
Important Message  Patient Details  Name: [name] MRN: [id number] Date of Birth: 07/15/1955   Medicare Important Message Given:  Yes    Nathen May 11/18/2016, 10:51 AM

## 2016-11-19 DIAGNOSIS — I272 Pulmonary hypertension, unspecified: Secondary | ICD-10-CM | POA: Diagnosis not present

## 2016-11-19 DIAGNOSIS — I2510 Atherosclerotic heart disease of native coronary artery without angina pectoris: Secondary | ICD-10-CM | POA: Diagnosis not present

## 2016-11-19 DIAGNOSIS — J449 Chronic obstructive pulmonary disease, unspecified: Secondary | ICD-10-CM | POA: Diagnosis not present

## 2016-11-19 DIAGNOSIS — I110 Hypertensive heart disease with heart failure: Secondary | ICD-10-CM | POA: Diagnosis not present

## 2016-11-19 DIAGNOSIS — E119 Type 2 diabetes mellitus without complications: Secondary | ICD-10-CM | POA: Diagnosis not present

## 2016-11-19 DIAGNOSIS — J90 Pleural effusion, not elsewhere classified: Secondary | ICD-10-CM | POA: Diagnosis not present

## 2016-11-19 DIAGNOSIS — K219 Gastro-esophageal reflux disease without esophagitis: Secondary | ICD-10-CM | POA: Diagnosis not present

## 2016-11-19 DIAGNOSIS — I5030 Unspecified diastolic (congestive) heart failure: Secondary | ICD-10-CM | POA: Diagnosis not present

## 2016-11-19 DIAGNOSIS — F329 Major depressive disorder, single episode, unspecified: Secondary | ICD-10-CM | POA: Diagnosis not present

## 2016-11-19 NOTE — Care Management (Signed)
CM contacted pt via phone 8/18 am - pt states the portable tank was delivered to the room last night prior to discharge and pt made it home safely.  Pt also informed CM that [employer] was able to fix her home equipment last night post discharge and [facility] delivered equipment.  Pt states "I have everything I need and they are going to bring me more portable tanks Monday".

## 2016-11-21 ENCOUNTER — Encounter: Payer: Self-pay | Admitting: Internal Medicine

## 2016-11-22 ENCOUNTER — Emergency Department (HOSPITAL_COMMUNITY): Payer: Medicare HMO

## 2016-11-22 ENCOUNTER — Encounter (HOSPITAL_COMMUNITY): Payer: Self-pay

## 2016-11-22 ENCOUNTER — Other Ambulatory Visit: Payer: Self-pay | Admitting: Licensed Clinical Social Worker

## 2016-11-22 DIAGNOSIS — C3492 Malignant neoplasm of unspecified part of left bronchus or lung: Secondary | ICD-10-CM | POA: Diagnosis present

## 2016-11-22 DIAGNOSIS — F329 Major depressive disorder, single episode, unspecified: Secondary | ICD-10-CM | POA: Diagnosis present

## 2016-11-22 DIAGNOSIS — J9601 Acute respiratory failure with hypoxia: Secondary | ICD-10-CM | POA: Diagnosis not present

## 2016-11-22 DIAGNOSIS — Z7951 Long term (current) use of inhaled steroids: Secondary | ICD-10-CM

## 2016-11-22 DIAGNOSIS — N171 Acute kidney failure with acute cortical necrosis: Secondary | ICD-10-CM

## 2016-11-22 DIAGNOSIS — I252 Old myocardial infarction: Secondary | ICD-10-CM | POA: Diagnosis not present

## 2016-11-22 DIAGNOSIS — J90 Pleural effusion, not elsewhere classified: Secondary | ICD-10-CM | POA: Diagnosis not present

## 2016-11-22 DIAGNOSIS — Z8614 Personal history of Methicillin resistant Staphylococcus aureus infection: Secondary | ICD-10-CM

## 2016-11-22 DIAGNOSIS — I4510 Unspecified right bundle-branch block: Secondary | ICD-10-CM | POA: Diagnosis present

## 2016-11-22 DIAGNOSIS — I2510 Atherosclerotic heart disease of native coronary artery without angina pectoris: Secondary | ICD-10-CM | POA: Diagnosis not present

## 2016-11-22 DIAGNOSIS — J9600 Acute respiratory failure, unspecified whether with hypoxia or hypercapnia: Secondary | ICD-10-CM | POA: Diagnosis present

## 2016-11-22 DIAGNOSIS — I5033 Acute on chronic diastolic (congestive) heart failure: Secondary | ICD-10-CM | POA: Diagnosis present

## 2016-11-22 DIAGNOSIS — I5030 Unspecified diastolic (congestive) heart failure: Secondary | ICD-10-CM | POA: Diagnosis not present

## 2016-11-22 DIAGNOSIS — I700 Atherosclerosis of aorta: Secondary | ICD-10-CM | POA: Diagnosis present

## 2016-11-22 DIAGNOSIS — Z955 Presence of coronary angioplasty implant and graft: Secondary | ICD-10-CM

## 2016-11-22 DIAGNOSIS — N185 Chronic kidney disease, stage 5: Secondary | ICD-10-CM | POA: Diagnosis not present

## 2016-11-22 DIAGNOSIS — Z515 Encounter for palliative care: Secondary | ICD-10-CM | POA: Diagnosis not present

## 2016-11-22 DIAGNOSIS — J9622 Acute and chronic respiratory failure with hypercapnia: Secondary | ICD-10-CM | POA: Diagnosis present

## 2016-11-22 DIAGNOSIS — I2729 Other secondary pulmonary hypertension: Secondary | ICD-10-CM | POA: Diagnosis present

## 2016-11-22 DIAGNOSIS — Z923 Personal history of irradiation: Secondary | ICD-10-CM

## 2016-11-22 DIAGNOSIS — N184 Chronic kidney disease, stage 4 (severe): Secondary | ICD-10-CM | POA: Diagnosis present

## 2016-11-22 DIAGNOSIS — E1122 Type 2 diabetes mellitus with diabetic chronic kidney disease: Secondary | ICD-10-CM | POA: Diagnosis present

## 2016-11-22 DIAGNOSIS — J449 Chronic obstructive pulmonary disease, unspecified: Secondary | ICD-10-CM | POA: Diagnosis present

## 2016-11-22 DIAGNOSIS — R0602 Shortness of breath: Secondary | ICD-10-CM | POA: Diagnosis not present

## 2016-11-22 DIAGNOSIS — R748 Abnormal levels of other serum enzymes: Secondary | ICD-10-CM

## 2016-11-22 DIAGNOSIS — I110 Hypertensive heart disease with heart failure: Secondary | ICD-10-CM | POA: Diagnosis not present

## 2016-11-22 DIAGNOSIS — Z9981 Dependence on supplemental oxygen: Secondary | ICD-10-CM

## 2016-11-22 DIAGNOSIS — R000 Tachycardia, unspecified: Secondary | ICD-10-CM | POA: Diagnosis not present

## 2016-11-22 DIAGNOSIS — Z8249 Family history of ischemic heart disease and other diseases of the circulatory system: Secondary | ICD-10-CM

## 2016-11-22 DIAGNOSIS — I214 Non-ST elevation (NSTEMI) myocardial infarction: Secondary | ICD-10-CM | POA: Diagnosis not present

## 2016-11-22 DIAGNOSIS — N170 Acute kidney failure with tubular necrosis: Secondary | ICD-10-CM | POA: Diagnosis present

## 2016-11-22 DIAGNOSIS — J910 Malignant pleural effusion: Secondary | ICD-10-CM | POA: Diagnosis present

## 2016-11-22 DIAGNOSIS — I5032 Chronic diastolic (congestive) heart failure: Secondary | ICD-10-CM | POA: Diagnosis not present

## 2016-11-22 DIAGNOSIS — Z7189 Other specified counseling: Secondary | ICD-10-CM

## 2016-11-22 DIAGNOSIS — J9620 Acute and chronic respiratory failure, unspecified whether with hypoxia or hypercapnia: Secondary | ICD-10-CM | POA: Diagnosis present

## 2016-11-22 DIAGNOSIS — R0902 Hypoxemia: Secondary | ICD-10-CM | POA: Diagnosis present

## 2016-11-22 DIAGNOSIS — Z9111 Patient's noncompliance with dietary regimen: Secondary | ICD-10-CM

## 2016-11-22 DIAGNOSIS — K219 Gastro-esophageal reflux disease without esophagitis: Secondary | ICD-10-CM | POA: Diagnosis not present

## 2016-11-22 DIAGNOSIS — E785 Hyperlipidemia, unspecified: Secondary | ICD-10-CM | POA: Diagnosis present

## 2016-11-22 DIAGNOSIS — Z7982 Long term (current) use of aspirin: Secondary | ICD-10-CM

## 2016-11-22 DIAGNOSIS — J189 Pneumonia, unspecified organism: Secondary | ICD-10-CM | POA: Diagnosis not present

## 2016-11-22 DIAGNOSIS — Z87891 Personal history of nicotine dependence: Secondary | ICD-10-CM

## 2016-11-22 DIAGNOSIS — Z79899 Other long term (current) drug therapy: Secondary | ICD-10-CM

## 2016-11-22 DIAGNOSIS — R402441 Other coma, without documented Glasgow coma scale score, or with partial score reported, in the field [EMT or ambulance]: Secondary | ICD-10-CM | POA: Diagnosis not present

## 2016-11-22 DIAGNOSIS — Z90710 Acquired absence of both cervix and uterus: Secondary | ICD-10-CM

## 2016-11-22 DIAGNOSIS — N179 Acute kidney failure, unspecified: Secondary | ICD-10-CM | POA: Diagnosis not present

## 2016-11-22 DIAGNOSIS — I21A1 Myocardial infarction type 2: Secondary | ICD-10-CM | POA: Diagnosis not present

## 2016-11-22 DIAGNOSIS — Z6841 Body Mass Index (BMI) 40.0 and over, adult: Secondary | ICD-10-CM

## 2016-11-22 DIAGNOSIS — K7200 Acute and subacute hepatic failure without coma: Secondary | ICD-10-CM | POA: Diagnosis present

## 2016-11-22 DIAGNOSIS — Z66 Do not resuscitate: Secondary | ICD-10-CM | POA: Diagnosis present

## 2016-11-22 DIAGNOSIS — R531 Weakness: Secondary | ICD-10-CM | POA: Diagnosis not present

## 2016-11-22 DIAGNOSIS — Z888 Allergy status to other drugs, medicaments and biological substances status: Secondary | ICD-10-CM

## 2016-11-22 DIAGNOSIS — J440 Chronic obstructive pulmonary disease with acute lower respiratory infection: Secondary | ICD-10-CM | POA: Diagnosis not present

## 2016-11-22 DIAGNOSIS — I959 Hypotension, unspecified: Secondary | ICD-10-CM | POA: Diagnosis present

## 2016-11-22 DIAGNOSIS — I272 Pulmonary hypertension, unspecified: Secondary | ICD-10-CM | POA: Diagnosis not present

## 2016-11-22 DIAGNOSIS — J9621 Acute and chronic respiratory failure with hypoxia: Secondary | ICD-10-CM | POA: Diagnosis not present

## 2016-11-22 DIAGNOSIS — I2582 Chronic total occlusion of coronary artery: Secondary | ICD-10-CM | POA: Diagnosis present

## 2016-11-22 DIAGNOSIS — N189 Chronic kidney disease, unspecified: Secondary | ICD-10-CM | POA: Diagnosis not present

## 2016-11-22 DIAGNOSIS — R404 Transient alteration of awareness: Secondary | ICD-10-CM | POA: Diagnosis not present

## 2016-11-22 DIAGNOSIS — G4730 Sleep apnea, unspecified: Secondary | ICD-10-CM | POA: Diagnosis present

## 2016-11-22 DIAGNOSIS — E119 Type 2 diabetes mellitus without complications: Secondary | ICD-10-CM | POA: Diagnosis not present

## 2016-11-22 LAB — I-STAT ARTERIAL BLOOD GAS, ED
Acid-Base Excess: 9 mmol/L — ABNORMAL HIGH (ref 0.0–2.0)
BICARBONATE: 35.7 mmol/L — AB (ref 20.0–28.0)
O2 Saturation: 93 %
PO2 ART: 72 mmHg — AB (ref 83.0–108.0)
Patient temperature: 99
TCO2: 38 mmol/L (ref 0–100)
pCO2 arterial: 64.1 mmHg — ABNORMAL HIGH (ref 32.0–48.0)
pH, Arterial: 7.354 (ref 7.350–7.450)

## 2016-11-22 LAB — CBC WITH DIFFERENTIAL/PLATELET
BASOS PCT: 1 %
Basophils Absolute: 0.1 10*3/uL (ref 0.0–0.1)
EOS ABS: 0 10*3/uL (ref 0.0–0.7)
Eosinophils Relative: 0 %
HEMATOCRIT: 33 % — AB (ref 36.0–46.0)
Hemoglobin: 9.5 g/dL — ABNORMAL LOW (ref 12.0–15.0)
Lymphocytes Relative: 5 %
Lymphs Abs: 0.7 10*3/uL (ref 0.7–4.0)
MCH: 28 pg (ref 26.0–34.0)
MCHC: 28.8 g/dL — ABNORMAL LOW (ref 30.0–36.0)
MCV: 97.3 fL (ref 78.0–100.0)
MONO ABS: 1.3 10*3/uL — AB (ref 0.1–1.0)
Monocytes Relative: 9 %
NEUTROS ABS: 12 10*3/uL — AB (ref 1.7–7.7)
Neutrophils Relative %: 85 %
PLATELETS: 258 10*3/uL (ref 150–400)
RBC: 3.39 MIL/uL — ABNORMAL LOW (ref 3.87–5.11)
RDW: 15 % (ref 11.5–15.5)
WBC: 14.1 10*3/uL — ABNORMAL HIGH (ref 4.0–10.5)

## 2016-11-22 LAB — COMPREHENSIVE METABOLIC PANEL
ALT: 364 U/L — AB (ref 14–54)
AST: 831 U/L — ABNORMAL HIGH (ref 15–41)
Albumin: 2.8 g/dL — ABNORMAL LOW (ref 3.5–5.0)
Alkaline Phosphatase: 87 U/L (ref 38–126)
Anion gap: 9 (ref 5–15)
BILIRUBIN TOTAL: 1.2 mg/dL (ref 0.3–1.2)
BUN: 18 mg/dL (ref 6–20)
CHLORIDE: 100 mmol/L — AB (ref 101–111)
CO2: 33 mmol/L — ABNORMAL HIGH (ref 22–32)
CREATININE: 3.31 mg/dL — AB (ref 0.44–1.00)
Calcium: 8.2 mg/dL — ABNORMAL LOW (ref 8.9–10.3)
GFR, EST AFRICAN AMERICAN: 16 mL/min — AB (ref >60)
GFR, EST NON AFRICAN AMERICAN: 14 mL/min — AB (ref >60)
Glucose, Bld: 188 mg/dL — ABNORMAL HIGH (ref 65–99)
Potassium: 4.7 mmol/L (ref 3.5–5.1)
Sodium: 142 mmol/L (ref 135–145)
TOTAL PROTEIN: 6.4 g/dL — AB (ref 6.5–8.1)

## 2016-11-22 LAB — I-STAT CG4 LACTIC ACID, ED: LACTIC ACID, VENOUS: 1.73 mmol/L (ref 0.5–1.9)

## 2016-11-22 LAB — I-STAT TROPONIN, ED: Troponin i, poc: 1.18 ng/mL (ref 0.00–0.08)

## 2016-11-22 LAB — BRAIN NATRIURETIC PEPTIDE: B NATRIURETIC PEPTIDE 5: 654.8 pg/mL — AB (ref 0.0–100.0)

## 2016-11-22 LAB — TROPONIN I: Troponin I: 2.22 ng/mL (ref <0.03)

## 2016-11-22 MED ORDER — HEPARIN (PORCINE) IN NACL 100-0.45 UNIT/ML-% IJ SOLN
1150.0000 [IU]/h | INTRAMUSCULAR | Status: DC
  Administered 2016-11-22: 1000 [IU]/h via INTRAVENOUS
  Filled 2016-11-22 (×2): qty 250

## 2016-11-22 MED ORDER — FUROSEMIDE 10 MG/ML IJ SOLN
80.0000 mg | Freq: Two times a day (BID) | INTRAMUSCULAR | Status: DC
  Administered 2016-11-22: 80 mg via INTRAVENOUS
  Filled 2016-11-22: qty 8

## 2016-11-22 MED ORDER — ROSUVASTATIN CALCIUM 20 MG PO TABS: 20.0000 mg | ORAL_TABLET | Freq: Every day | ORAL | Status: DC

## 2016-11-22 MED ORDER — ASPIRIN 81 MG PO CHEW
324.0000 mg | CHEWABLE_TABLET | Freq: Once | ORAL | Status: AC
  Administered 2016-11-22: 324 mg via ORAL
  Filled 2016-11-22: qty 4

## 2016-11-22 MED ORDER — SENNA 8.6 MG PO TABS: 2.0000 | ORAL_TABLET | Freq: Every day | ORAL | Status: DC | PRN

## 2016-11-22 MED ORDER — ASPIRIN 81 MG PO CHEW
81.0000 mg | CHEWABLE_TABLET | Freq: Every day | ORAL | Status: DC
  Administered 2016-11-23 – 2016-11-24 (×2): 81 mg via ORAL
  Filled 2016-11-22 (×2): qty 1

## 2016-11-22 MED ORDER — GABAPENTIN 300 MG PO CAPS
600.0000 mg | ORAL_CAPSULE | Freq: Every day | ORAL | Status: DC
  Administered 2016-11-23 – 2016-11-24 (×2): 600 mg via ORAL
  Filled 2016-11-22 (×2): qty 2

## 2016-11-22 MED ORDER — FLUTICASONE FUROATE-VILANTEROL 100-25 MCG/INH IN AEPB
1.0000 | INHALATION_SPRAY | Freq: Every day | RESPIRATORY_TRACT | Status: DC
  Administered 2016-11-23 – 2016-11-25 (×3): 1 via RESPIRATORY_TRACT
  Filled 2016-11-22: qty 28

## 2016-11-22 MED ORDER — POLYETHYLENE GLYCOL 3350 17 G PO PACK: 17.0000 g | PACK | Freq: Every day | ORAL | Status: DC | PRN

## 2016-11-22 MED ORDER — HEPARIN BOLUS VIA INFUSION
4000.0000 [IU] | Freq: Once | INTRAVENOUS | Status: AC
  Administered 2016-11-23: 4000 [IU] via INTRAVENOUS
  Filled 2016-11-22: qty 4000

## 2016-11-22 MED ORDER — ALBUTEROL SULFATE (2.5 MG/3ML) 0.083% IN NEBU: 2.5000 mg | INHALATION_SOLUTION | RESPIRATORY_TRACT | Status: DC | PRN

## 2016-11-22 MED ORDER — ENOXAPARIN SODIUM 30 MG/0.3ML ~~LOC~~ SOLN: 30.0000 mg | SUBCUTANEOUS | Status: DC

## 2016-11-22 MED ORDER — PANTOPRAZOLE SODIUM 40 MG PO TBEC
40.0000 mg | DELAYED_RELEASE_TABLET | Freq: Every day | ORAL | Status: DC
  Administered 2016-11-23 – 2016-11-24 (×2): 40 mg via ORAL
  Filled 2016-11-22: qty 1

## 2016-11-22 MED ORDER — IPRATROPIUM-ALBUTEROL 0.5-2.5 (3) MG/3ML IN SOLN
3.0000 mL | Freq: Four times a day (QID) | RESPIRATORY_TRACT | Status: DC
  Administered 2016-11-23: 3 mL via RESPIRATORY_TRACT
  Filled 2016-11-22: qty 3

## 2016-11-22 MED ORDER — BUPROPION HCL ER (SR) 150 MG PO TB12
150.0000 mg | ORAL_TABLET | Freq: Two times a day (BID) | ORAL | Status: DC
  Administered 2016-11-23 – 2016-11-24 (×4): 150 mg via ORAL
  Filled 2016-11-22 (×6): qty 1

## 2016-11-22 MED ORDER — FLUTICASONE-UMECLIDIN-VILANT 100-62.5-25 MCG/INH IN AEPB: 1.0000 | INHALATION_SPRAY | Freq: Every day | RESPIRATORY_TRACT | Status: DC

## 2016-11-22 NOTE — Patient Outreach (Signed)
[place] [facility]) Care Management  11/18/2016  [name] 1956/01/27 [id number]  Assessment-CSW completed outreach attempt today to follow up on hospitalization. CSW unable to reach patient successfully. CSW left a HIPPA compliant voice message encouraging patient to return call once available. CSW left on voice message that CSW will be out of the office starting 11/24/16 and will return to the office on 11/28/16.  Plan-CSW will await return call or complete an additional outreach within two weeks.  Eula Fried, BSW, MSW, [email or web address] Phone: [phone] Fax: [phone]

## 2016-11-22 NOTE — ED Provider Notes (Signed)
[facility] DEPT Provider Note   CSN: [id number] Arrival date & time: 11/17/2016  1640     History   Chief Complaint Chief Complaint  Patient presents with  . Hypoxia    HPI [name] is a 61 y.o. female.  HPI Patient is on 4-6 L of supplemental oxygen chronically. Recently discharged from the hospital for rotatory failure. Found by the home care nurse line and floor with oxygen side her. EMS was called. O2 sats initially in the 50-60s but improved after being placed back on her oxygen. Per EMS home care nurse states patient has had cough for the past 2 days. Patient also states she's also had some central chest pressure. Has difficulty characterizing length of time that she has had the pressure. Describes it as central chest pressure without radiation. Past Medical History:  Diagnosis Date  . Arthritis    "some scattered" (03/03/2015)  . CHF (congestive heart failure) ([place])   . Chronic bronchitis ([place])    "get it most years" (03/03/2015)  . COPD (chronic obstructive pulmonary disease) (HCC)    Severe. Gold Stage IV.  PFTs (12/2008) - severe obstructive airway disease. Active tobacco use. Requires 4L O2 at home.  . Coronary artery disease    S/P PCI of LAD with DES (12/2008). Total occlusion of RCA noted at that time., medically managed. ACS ruled out 03/2009 with Lexiscan myoview . Followed by [facility].  . Depression   . Diabetes mellitus without complication ([place])   . Diastolic dysfunction    2-D Echo (12/2008) - Normal LV Systolic funciton with EF 60-65%. Grade 1 diastolid dysfunction. No regional wall motion abnormalities. Moderate pulmonary HTN with PA peak pressure 41mHg.  . Full dentures   . GERD (gastroesophageal reflux disease)    S/P Nissen fundoplication.  .Marland KitchenHistory of hiatal hernia   . Hx MRSA infection    Recurrent MRSA thigh abscesses.  . Hyperlipidemia   . Lung cancer ([place]    "left"  . Obesity   . On home oxygen therapy since 2010   4L all the time    . Pneumonia   . Prediabetes    HgbA1c 6.4 (12/2008)  . Pulmonary hypertension (HM[place]    2-D Echo (040/9811 - Systolic pressure was moderately increased. PA peak pressure  565mg. 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 Followed by Dr. WrJoya GaskinsLVelora Heckler . Pulmonary nodule, right    Small right middle lobe nodule. Stable as of 12/2008.  . Marland Kitchenhortness of breath dyspnea    with a lot of exertion; if fluid builds up    Patient Active Problem List   Diagnosis Date Noted  . Goals of care, counseling/discussion   . Palliative care by specialist   . Morbid obesity (HCRichmond  . Acute on chronic respiratory failure (HCMarshfield06/19/2018  . COPD exacerbation (HCJacksonville05/22/2018  . Pleural effusion   . Constipation due to slow transit   . History of adenocarcinoma of lung   . CKD (chronic kidney disease) stage 4, GFR 15-29 ml/min (HCC) 02/04/2016  . Benign paroxysmal positional vertigo 09/10/2015  . Subjective tinnitus 09/10/2015  . Chronic venous insufficiency   . Vitamin B12 deficiency neuropathy (HCPlum[place]09/18/2016  . Adenocarcinoma of left lung, stage 4 (HCCliffdell09/04/2014  . Healthcare maintenance 02/06/2013  . Diastolic CHF with preserved left ventricular function, NYHA class 2 (HCSeward08/07/2012  . Other emphysema (HCRockwell  .  Hyperlipidemia   . Financial difficulties 06/15/2010  . Pulmonary hypertension ([place]) 01/05/2009  . Morbid obesity with BMI of 40.0-44.9, adult ([place]) 12/31/2008  . OSA and COPD overlap syndrome ([place]) 12/31/2008  . Shortness of breath 12/31/2008  . Tobacco abuse 12/22/2008  . 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 Severe chronic obstructive pulmonary disease ([place]) 12/22/2008  . Gastroesophageal reflux disease 12/22/2008  . Insulin dependent type 2 diabetes mellitus, controlled ([place]) 12/22/2008    Past Surgical History:  Procedure  Laterality Date  . ABDOMINAL HYSTERECTOMY  1980's   "still have my cervix"  . APPENDECTOMY    . BACK SURGERY    . BILATERAL OOPHORECTOMY  1979  . BREAST LUMPECTOMY Right 1990's    (biopsy negative)  . CHEST TUBE INSERTION Left 09/23/2016   Procedure: INSERTION LEFT PLEURAL DRAINAGE CATHETER;  Surgeon: Ivin Poot, MD;  Location: [facility];  Service: Thoracic;  Laterality: Left;  . CORONARY ANGIOPLASTY WITH STENT PLACEMENT  2010   [facility]; RCA  . HERNIA REPAIR    . IR THORACENTESIS ASP PLEURAL SPACE W/IMG GUIDE  07/18/2016  . IR THORACENTESIS ASP PLEURAL SPACE W/IMG GUIDE  08/24/2016  . KNEE ARTHROSCOPY Right ~ 2000  . [place] SURGERY  2001   "herniated discs; both by [facility], Dr. Lorin Mercy"  . NISSEN FUNDOPLICATION    . RIGHT HEART CATHETERIZATION N/A 11/09/2012   Procedure: RIGHT HEART CATH;  Surgeon: Larey Dresser, MD;  Location: [facility] CATH LAB;  Service: Cardiovascular;  Laterality: N/A;  . TONSILLECTOMY    . TUBAL LIGATION  1979  . VIDEO BRONCHOSCOPY WITH ENDOBRONCHIAL ULTRASOUND N/A 12/15/2014   Procedure: VIDEO BRONCHOSCOPY WITH ENDOBRONCHIAL ULTRASOUND;  Surgeon: Ivin Poot, MD;  Location: [facility] OR;  Service: Thoracic;  Laterality: N/A;    OB History    No data available       Home Medications    Prior to Admission medications   Medication Sig Start Date End Date Taking? Authorizing Provider  acetaminophen-codeine (TYLENOL #3) 300-30 MG tablet Take 1 tablet by mouth every 6 (six) hours as needed for severe pain. 09/24/16  Yes Hoffman, Jessica Ratliff, DO  albuterol (VENTOLIN HFA) 108 (90 Base) MCG/ACT inhaler Inhale 1-2 puffs into the lungs every 6 (six) hours as needed for shortness of breath. 02/03/16  Yes Oval Linsey, MD  aspirin 81 MG chewable tablet Chew 1 tablet (81 mg total) by mouth at bedtime. 01/16/15  Yes Oval Linsey, MD  buPROPion [facility] SR) 150 MG 12 hr tablet Take 1 tablet (150 mg total) by mouth 2 (two) times daily. 10/03/16  Yes Oval Linsey,  MD  fluticasone furoate-vilanterol (BREO ELLIPTA) 100-25 MCG/INH AEPB Inhale 1 puff into the lungs daily. 11/19/16  Yes Lacroce, Hulen Shouts, MD  Fluticasone-Umeclidin-Vilant (TRELEGY ELLIPTA) 100-62.5-25 MCG/INH AEPB Inhale 1 puff into the lungs daily. 10/03/16  Yes Oval Linsey, MD  furosemide (LASIX) 40 MG tablet Take 1 tablet (40 mg total) by mouth daily. 08/25/16  Yes Molt, Bethany, DO  gabapentin (NEURONTIN) 300 MG capsule Take 2 capsules (600 mg total) by mouth at bedtime. 08/05/16  Yes Oval Linsey, MD  ipratropium-albuterol (DUONEB) 0.5-2.5 (3) MG/3ML SOLN Take 3 mLs by nebulization 2 (two) times daily. 11/18/16  Yes Lacroce, Hulen Shouts, MD  metolazone (ZAROXOLYN) 2.5 MG tablet TAKE 1 TABLET BY MOUTH AS NEEDED(FOR 5 LBS WEIGHT GAIN) Patient taking differently: Take 2.5 mg by mouth once a day as needed for a weight gain of 5 pounds  05/13/15  Yes [name] Friar, PA-C  pantoprazole (PROTONIX) 40 MG tablet Take 1 tablet (40 mg total) by mouth daily. 11/05/15  Yes Oval Linsey, MD  polyethylene glycol [facility] / GLYCOLAX) packet Take 17 g by mouth daily as needed for moderate constipation or severe constipation. 08/24/16  Yes Molt, Bethany, DO  predniSONE (DELTASONE) 20 MG tablet Take 2 tablets (40 mg total) by mouth daily with breakfast. 11/18/16  Yes Lacroce, Hulen Shouts, MD  Pyridoxine HCl (VITAMIN B-6 PO) Take 1 tablet by mouth daily.   Yes [provider]  ranitidine (ZANTAC) 300 MG tablet Take 1 tablet (300 mg total) by mouth at bedtime. 08/05/16  Yes Oval Linsey, MD  rosuvastatin (CRESTOR) 20 MG tablet Take 1 tablet (20 mg total) by mouth daily. Patient taking differently: Take 20 mg by mouth at bedtime.  10/03/16  Yes Oval Linsey, MD  senna (SENOKOT) 8.6 MG TABS tablet Take 2 tablets (17.2 mg total) by mouth daily as needed for mild constipation or moderate constipation. 08/24/16  Yes Molt, Bethany, DO  ipratropium-albuterol (DUONEB) 0.5-2.5 (3) MG/3ML SOLN USE 3 ML VIA  NEBULIZER EVERY 6 HOURS AS NEEDED FOR SHORTNESS OF BREATH Patient not taking: Reported on 11/21/2016 10/19/16   Annia Belt, MD    Family History Family History  Problem Relation Age of Onset  . Heart disease Mother 59       Deceased from MI at 3yo  . Hypertension Mother   . Heart disease Father 37       Deceased of MI age 10yo  . Hypertension Father   . Hypertension Brother   . Lung cancer Unknown        Grandmother    Social History Social History  Substance Use Topics  . Smoking status: Former Smoker    Packs/day: 1.00    Years: 45.00    Types: Cigarettes    Start date: 04/04/1969    Quit date: 11/04/2015  . Smokeless tobacco: Never Used     Comment: 1 cig per day  . Alcohol use No     Allergies   Fluconazole and Atorvastatin   Review of Systems Review of Systems  Constitutional: Positive for fatigue. Negative for chills and fever.  Respiratory: Positive for cough, chest tightness and shortness of breath.   Cardiovascular: Positive for leg swelling. Negative for chest pain.  Gastrointestinal: Negative for abdominal pain, nausea and vomiting.  Musculoskeletal: Negative for back pain.  Skin: Negative for rash and wound.  Neurological: Positive for weakness (generalized). Negative for dizziness and headaches.     Physical Exam Updated Vital Signs BP (!) 99/47   Pulse (!) 105   Temp 99.1 F (37.3 C) (Axillary)   Resp (!) 21   Ht _0  (1.651 m)   Wt 121.4 kg (267 lb 10.2 oz)   SpO2 97%   BMI 44.54 kg/m   Physical Exam   ED Treatments / Results  Labs (all labs ordered are listed, but only abnormal results are displayed) Labs Reviewed  CBC WITH DIFFERENTIAL/PLATELET - Abnormal; Notable for the following:       Result Value   WBC 14.1 (*)    RBC 3.39 (*)    Hemoglobin 9.5 (*)    HCT 33.0 (*)    MCHC 28.8 (*)    Neutro Abs 12.0 (*)    Monocytes Absolute 1.3 (*)    All other components within normal limits  COMPREHENSIVE METABOLIC PANEL -  Abnormal; Notable for the following:  Chloride 100 (*)    CO2 33 (*)    Glucose, Bld 188 (*)    Creatinine, Ser 3.31 (*)    Calcium 8.2 (*)    Total Protein 6.4 (*)    Albumin 2.8 (*)    AST 831 (*)    ALT 364 (*)    GFR calc non Af Amer 14 (*)    GFR calc Af Amer 16 (*)    All other components within normal limits  BRAIN NATRIURETIC PEPTIDE - Abnormal; Notable for the following:    B Natriuretic Peptide 654.8 (*)    All other components within normal limits  URINALYSIS, ROUTINE W REFLEX MICROSCOPIC - Abnormal; Notable for the following:    Color, Urine AMBER (*)    APPearance CLOUDY (*)    Hgb urine dipstick MODERATE (*)    Protein, ur 100 (*)    Bacteria, UA MANY (*)    Squamous Epithelial / LPF 6-30 (*)    Non Squamous Epithelial 0-5 (*)    All other components within normal limits  TROPONIN I - Abnormal; Notable for the following:    Troponin I 2.22 (*)    All other components within normal limits  TROPONIN I - Abnormal; Notable for the following:    Troponin I 2.49 (*)    All other components within normal limits  TROPONIN I - Abnormal; Notable for the following:    Troponin I 2.65 (*)    All other components within normal limits  TROPONIN I - Abnormal; Notable for the following:    Troponin I 2.10 (*)    All other components within normal limits  HEPARIN LEVEL (UNFRACTIONATED) - Abnormal; Notable for the following:    Heparin Unfractionated 0.26 (*)    All other components within normal limits  CBC - Abnormal; Notable for the following:    WBC 16.7 (*)    RBC 3.11 (*)    Hemoglobin 8.8 (*)    HCT 30.6 (*)    MCHC 28.8 (*)    RDW 15.6 (*)    All other components within normal limits  COMPREHENSIVE METABOLIC PANEL - Abnormal; Notable for the following:    Glucose, Bld 178 (*)    BUN 25 (*)    Creatinine, Ser 3.85 (*)    Calcium 7.9 (*)    Total Protein 6.0 (*)    Albumin 2.7 (*)    AST 1,882 (*)    ALT 832 (*)    GFR calc non Af Amer 12 (*)    GFR calc  Af Amer 13 (*)    All other components within normal limits  PROTIME-INR - Abnormal; Notable for the following:    Prothrombin Time 16.5 (*)    All other components within normal limits  APTT - Abnormal; Notable for the following:    aPTT 56 (*)    All other components within normal limits  HEPARIN LEVEL (UNFRACTIONATED) - Abnormal; Notable for the following:    Heparin Unfractionated 0.18 (*)    All other components within normal limits  GLUCOSE, CAPILLARY - Abnormal; Notable for the following:    Glucose-Capillary 197 (*)    All other components within normal limits  GLUCOSE, CAPILLARY - Abnormal; Notable for the following:    Glucose-Capillary 318 (*)    All other components within normal limits  CBC - Abnormal; Notable for the following:    WBC 16.3 (*)    RBC 3.01 (*)    Hemoglobin 8.4 (*)  HCT 29.3 (*)    MCHC 28.7 (*)    All other components within normal limits  COMPREHENSIVE METABOLIC PANEL - Abnormal; Notable for the following:    Chloride 98 (*)    Glucose, Bld 168 (*)    BUN 34 (*)    Creatinine, Ser 4.92 (*)    Calcium 7.4 (*)    Total Protein 5.7 (*)    Albumin 2.6 (*)    AST 605 (*)    ALT 740 (*)    GFR calc non Af Amer 9 (*)    GFR calc Af Amer 10 (*)    All other components within normal limits  CK - Abnormal; Notable for the following:    Total CK 291 (*)    All other components within normal limits  CBC - Abnormal; Notable for the following:    WBC 17.8 (*)    RBC 3.16 (*)    Hemoglobin 8.7 (*)    HCT 30.9 (*)    MCHC 28.2 (*)    All other components within normal limits  COMPREHENSIVE METABOLIC PANEL - Abnormal; Notable for the following:    Chloride 95 (*)    Glucose, Bld 202 (*)    BUN 39 (*)    Creatinine, Ser 5.66 (*)    Calcium 8.4 (*)    Total Protein 6.1 (*)    Albumin 2.6 (*)    AST 157 (*)    ALT 580 (*)    GFR calc non Af Amer 7 (*)    GFR calc Af Amer 8 (*)    All other components within normal limits  I-STAT TROPONIN, ED -  Abnormal; Notable for the following:    Troponin i, poc 1.18 (*)    All other components within normal limits  I-STAT ARTERIAL BLOOD GAS, ED - Abnormal; Notable for the following:    pCO2 arterial 64.1 (*)    pO2, Arterial 72.0 (*)    Bicarbonate 35.7 (*)    Acid-Base Excess 9.0 (*)    All other components within normal limits  CULTURE, BLOOD (ROUTINE X 2)  CULTURE, BLOOD (ROUTINE X 2)  URINALYSIS, ROUTINE W REFLEX MICROSCOPIC  I-STAT CG4 LACTIC ACID, ED    EKG  EKG Interpretation  Date/Time:  Tuesday November 22 2016 16:58:31 EDT Ventricular Rate:  107 PR Interval:    QRS Duration: 99 QT Interval:  347 QTC Calculation: 463 R Axis:   95 Text Interpretation:  Sinus tachycardia Atrial premature complexes Low voltage, precordial leads Consider right ventricular hypertrophy Nonspecific T abnormalities, anterior leads Confirmed by Lita Mains  MD, Jenie Parish ([id number]) on 11/16/2016 6:20:45 PM       Radiology Dg Chest Port 1 View  Result Date: 11/24/2016 CLINICAL DATA:  Pleural effusion.  Hypoxia. EXAM: PORTABLE CHEST 1 VIEW COMPARISON:  11/26/2016 FINDINGS: Bilateral diffuse interstitial thickening. Small bilateral pleural effusions, left greater than right. No pneumothorax. Stable cardiomegaly. IMPRESSION: Findings concerning for CHF. Electronically Signed   By: Kathreen Devoid   On: 11/24/2016 16:30    Procedures Procedures (including critical care time)  Medications Ordered in ED Medications  fluticasone furoate-vilanterol (BREO ELLIPTA) 100-25 MCG/INH 1 puff (1 puff Inhalation Given 11/28/16 1040)  buPROPion (WELLBUTRIN SR) 12 hr tablet 150 mg (150 mg Oral Not Given 11-28-2016 1000)  polyethylene glycol (MIRALAX / GLYCOLAX) packet 17 g (not administered)  senna (SENOKOT) tablet 17.2 mg (not administered)  gabapentin (NEURONTIN) capsule 600 mg (600 mg Oral Given 11/24/16 2220)  pantoprazole (PROTONIX) EC tablet 40  mg (40 mg Oral Not Given 16-Dec-2016 1000)  aspirin chewable tablet 81 mg (81 mg  Oral Given 11/24/16 2221)  albuterol (PROVENTIL) (2.5 MG/3ML) 0.083% nebulizer solution 2.5 mg (not administered)  ipratropium-albuterol (DUONEB) 0.5-2.5 (3) MG/3ML nebulizer solution 3 mL (3 mLs Nebulization Given 2016/12/16 1040)  aspirin chewable tablet 324 mg (324 mg Oral Given 11/12/2016 1842)  heparin bolus via infusion 4,000 Units (4,000 Units Intravenous Bolus from Bag 11/23/16 0003)  ipratropium-albuterol (DUONEB) 0.5-2.5 (3) MG/3ML nebulizer solution (3 mLs  Given 11/23/16 0848)   CRITICAL CARE Performed by: Lita Mains, Tayler Heiden Total critical care time: 40 minutes Critical care time was exclusive of separately billable procedures and treating other patients. Critical care was necessary to treat or prevent imminent or life-threatening deterioration. Critical care was time spent personally by me on the following activities: development of treatment plan with patient and/or surrogate as well as nursing, discussions with consultants, evaluation of patient's response to treatment, examination of patient, obtaining history from patient or surrogate, ordering and performing treatments and interventions, ordering and review of laboratory studies, ordering and review of radiographic studies, pulse oximetry and re-evaluation of patient's condition.  Initial Impression / Assessment and Plan / ED Course  I have reviewed the triage vital signs and the nursing notes.  Pertinent labs & imaging results that were available during my care of the patient were reviewed by me and considered in my medical decision making (see chart for details).     Patient with evidence of pulmonary edema. Placed on BiPAP with improvement of oxygenation and work of breathing. Continues to have borderline low blood pressure.  Troponin is elevated. Discussed with cardiology who will consult on the patient. Internal medicine service will see patient in the emergency department and admit. Final Clinical Impressions(s) / ED Diagnoses    Final diagnoses:  Acute respiratory failure with hypoxia (HCC)  NSTEMI (non-ST elevated myocardial infarction) ([place])  Hypoxia  Pleural effusion, malignant    New Prescriptions Current Discharge Medication List       Julianne Rice, MD December 16, 2016 1321

## 2016-11-22 NOTE — ED Notes (Signed)
Belongings: shirt, dentures in cup

## 2016-11-22 NOTE — H&P (Signed)
Date: 11/21/2016               Patient Name:  [name] MRN: [id number]  DOB: February 28, 1956 Age / Sex: 61 y.o., female   PCP: Oval Linsey, MD         Medical Service: Internal Medicine Teaching Service         Attending Physician: Dr. Evette Doffing, Mallie Mussel, *    First Contact: Dr. Johny Chess Pager: [phone]  Second Contact: Dr. Juleen China Pager: [phone]       After Hours (After 5p/  First Contact Pager: [phone]  weekends / holidays): Second Contact Pager: [phone]   Chief Complaint: Hypoxia  History of Present Illness: [name] is a 61 yo F with a history of Severe COPD; CHFpEF (EF 55-60%); CKD-IV; CAD (s/p DES to LAD in 2010); DM; and GERD who presents following an episode of hypoxia at home. Patient was recently discharged on 8/16 following an admission for Acute on Chronic Respiratory failure, treated for COPD exacerbation with Duonebs and Steroids, and had a PleurX catheter removed. She states that she does not remember very much about her time at home stating she remembers getting home and her grandson helping her around the house, but had reported 2-3 days of malaise and fatigue to EMS. She does not remember the events leading up to her episode today. Her home care provider reported that she has had a nonproductive cough and congestion for 2 days. She was found by her home care nurse and grandson semiconscious with her O2 nasal cannula laying on the floor. She states that she initially did not want to come to the hospital but was convinced by her nurse and grandson. Patient's O2 sat was 50-60% on EMS arrival. Her sats improved to the 90s on 6L. She was sating 99% on BiPAP and 97% on 5L in ED. Her sats dropped to 80% when asked to sit up in ED but rapidly returned when she lay back down. She endorses shortness of breath, subjective chills recently and some neck pain. She states she takes 38m of lasix daily, but was prescribed 442m We had a discussion about code status. Patient  expressed that she would prefer to pass peacefully. In the past she has felt pressure from family to remain full code, but now she does not want any measures that would lead to her having a decreased quality of life.   In the ED, CBC showed WBC 14.2, Hgb 9.5 (chronic); CMP Cr 3.1 (baseline 2.4), AST 831, ALT 364; BNP 654.8; Troponin 1.18; Lactate 1.73. Blood gas showed hypoxemic hypercapnia consistent with her baseline. EKG showed sinus tachycardia with PACs. CXR showed stable chronic L pleural effusion, mild bilateral pulmonary edema. Cardiology was consulted for her elevated troponin. Patient was given 32589mSA and placed on BiPAP. She will be admitted for further workup and treatment.  Meds:  Current Meds  Medication Sig  . acetaminophen-codeine (TYLENOL #3) 300-30 MG tablet Take 1 tablet by mouth every 6 (six) hours as needed for severe pain.  . aMarland Kitchenbuterol (VENTOLIN HFA) 108 (90 Base) MCG/ACT inhaler Inhale 1-2 puffs into the lungs every 6 (six) hours as needed for shortness of breath.  . aMarland Kitchenpirin 81 MG chewable tablet Chew 1 tablet (81 mg total) by mouth at bedtime.  . bMarland KitchenPROPion (WELLBUTRIN SR) 150 MG 12 hr tablet Take 1 tablet (150 mg total) by mouth 2 (two) times daily.  . fluticasone furoate-vilanterol (BREO ELLIPTA) 100-25 MCG/INH AEPB Inhale 1 puff into  the lungs daily.  . Fluticasone-Umeclidin-Vilant (TRELEGY ELLIPTA) 100-62.5-25 MCG/INH AEPB Inhale 1 puff into the lungs daily.  . furosemide (LASIX) 40 MG tablet Take 1 tablet (40 mg total) by mouth daily.  Marland Kitchen gabapentin (NEURONTIN) 300 MG capsule Take 2 capsules (600 mg total) by mouth at bedtime.  Marland Kitchen ipratropium-albuterol (DUONEB) 0.5-2.5 (3) MG/3ML SOLN Take 3 mLs by nebulization 2 (two) times daily.  . metolazone (ZAROXOLYN) 2.5 MG tablet TAKE 1 TABLET BY MOUTH AS NEEDED(FOR 5 LBS WEIGHT GAIN) (Patient taking differently: Take 2.5 mg by mouth once a day as needed for a weight gain of 5 pounds)  . pantoprazole (PROTONIX) 40 MG tablet Take 1  tablet (40 mg total) by mouth daily.  . polyethylene glycol (MIRALAX / GLYCOLAX) packet Take 17 g by mouth daily as needed for moderate constipation or severe constipation.  . predniSONE (DELTASONE) 20 MG tablet Take 2 tablets (40 mg total) by mouth daily with breakfast.  . Pyridoxine HCl (VITAMIN B-6 PO) Take 1 tablet by mouth daily.  . ranitidine (ZANTAC) 300 MG tablet Take 1 tablet (300 mg total) by mouth at bedtime.  . rosuvastatin (CRESTOR) 20 MG tablet Take 1 tablet (20 mg total) by mouth daily. (Patient taking differently: Take 20 mg by mouth at bedtime. )  . senna (SENOKOT) 8.6 MG TABS tablet Take 2 tablets (17.2 mg total) by mouth daily as needed for mild constipation or moderate constipation.     Allergies: Allergies as of 11/15/2016 - Review Complete 11/02/2016  Allergen Reaction Noted  . Fluconazole Anaphylaxis and Itching   . Atorvastatin Other (See Comments) 09/09/2014   Past Medical History:  Diagnosis Date  . Arthritis    "some scattered" (03/03/2015)  . CHF (congestive heart failure) ([place])   . Chronic bronchitis ([place])    "get it most years" (03/03/2015)  . COPD (chronic obstructive pulmonary disease) ([facility])    Severe. Gold Stage IV.  PFTs (12/2008) - severe obstructive airway disease. Active tobacco use. Requires 4L O2 at home.  . Coronary artery disease    S/P PCI of LAD with DES (12/2008). Total occlusion of RCA noted at that time., medically managed. ACS ruled out 03/2009 with Lexiscan myoview . Followed by [facility].  . Depression   . Diabetes mellitus without complication ([place])   . Diastolic dysfunction    2-D Echo (12/2008) - Normal LV Systolic funciton with EF 60-65%. Grade 1 diastolid dysfunction. No regional wall motion abnormalities. Moderate pulmonary HTN with PA peak pressure 67mHg.  . Full dentures   . GERD (gastroesophageal reflux disease)    S/P Nissen fundoplication.  .Marland KitchenHistory of hiatal hernia   . Hx MRSA infection    Recurrent MRSA thigh abscesses.   . Hyperlipidemia   . Lung cancer ([place]    "left"  . Obesity   . On home oxygen therapy since 2010   4L all the time  . Pneumonia   . Prediabetes    HgbA1c 6.4 (12/2008)  . Pulmonary hypertension ([place]    2-D Echo (035/3299 - Systolic pressure was moderately increased. PA peak pressure  534mg. 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 Followed by Dr. WrJoya GaskinsLVelora Heckler . Pulmonary nodule, right    Small right middle lobe nodule. Stable as of 12/2008.  . Marland Kitchenhortness of breath dyspnea    with a lot of exertion; if fluid builds up    Family History:  -  Mother: HTN, CAD (died from MI) - Father: HTN, CAD (dies from MI)  Social History: Social History  Substance Use Topics  . Smoking status: Former Smoker    Packs/day: 1.00    Years: 45.00    Types: Cigarettes    Start date: 04/04/1969    Quit date: 11/04/2015  . Smokeless tobacco: Never Used     Comment: 1 cig per day  . Alcohol use No    Review of Systems: A complete ROS was negative except as per HPI.   Physical Exam: Blood pressure 94/63, pulse 97, temperature 99.3 F (37.4 C), temperature source Oral, resp. rate 19, height _0  (1.651 m), weight 266 lb (120.7 kg), SpO2 99 %. Physical Exam  Constitutional: She is oriented to person, place, and time. She appears well-developed and well-nourished.  Mildly distress obese female  Eyes: EOM are normal. Right eye exhibits no discharge. Left eye exhibits no discharge.  Cardiovascular: Regular rhythm and normal heart sounds.   Tachycardic  Pulmonary/Chest: She has no wheezes.  Mild respiratory distress Bibasilar rales Deminish breath sounds, especially at L base   Abdominal: Soft. Bowel sounds are normal. She exhibits no distension. There is no tenderness.  Musculoskeletal: She exhibits edema. She exhibits no deformity.  2+ Pitting edema LLE 1+ Pitting edema RLE    Neurological: She is alert and oriented to person, place, and time.  Skin: Skin is warm and dry. No rash noted. No erythema.    EKG: personally reviewed my interpretation is sinus tachycardia, RBBB, PACs  CXR: personally reviewed my interpretation is chronic L pleural effusion, Bilateral pulmonary edema  Assessment & Plan by Problem:  Mrs. Detamore is a 61 yo F with a history of Severe COPD; CHFpEF (EF 55-60%); CKD-IV; CAD (s/p DES to LAD in 2010); DM; and GERD who presents following an episode of hypoxia at home.   Acute on chronic respiratory failure: In the setting of CHFpEF, severe COPD, Hx of Lung Cancer with chronic left lung volume loss s/p radiation therapy, and chronic L pleural effusion.  Patient presented following and episode of hypoxia off her home 4L of O2 sating at 50-60%. Oxygenation improved with 6L from EMS. Blood gas was consistent with her chronic hypercarbic hypoxemia. She has evidence of volume over load on CXR and exam and reports taking 55m of lasix daily when prescribed 467m BNP 654.8. Last echo (04/16/16) showed EF 55-60% and grade 1 diastolic dysfunction. - Continue BiPAP - Lasix 8055mV BID - Albuterol Neb q4h PRN - Continue Duoneb BID - Continue Fluticasone-Umeclidin-Vilant  - Continue Fluticasone furoate-vilanterol (BREO ELLIPTA) 1 Puff Daily - Repeat CMP & CBC in AM  Elevated Troponin: Troponin of 1.18 in the setting of CKD-IV and CAD (with DES to LAD and CTO of RCA in 2010). Cardiology consulted in the ED. Nonischemic EKG, no chest pain. ASA 325m3mven in ED. - Appreciate cardiology reccomendations - Trend troponin - EKG in AM - Continue ASA 81mg63mly - Continue rosuvastatin 20mg 72my - Started on Heparin  Acute on chronic renal failure. In the setting of CKD-IV, and signs and symptoms of volume overload. Cr on admission 3.1 (up from baseline of 2.4 on admission.) - Lasix as above. - Repeat CMP in AM  GERD: - Continue Protonix 40mg P81maily   FEN: Heart Diet DVT Prophylaxis: Heparin Code Status: Patial, NIPPV/BiPAP only (discussed on admission)   Dispo: Admit patient to Observation with expected length of stay less than 2 midnights.  Signed: Melvin,Trilby Drummer  Sheppard Coil, MD 11/29/2016, 9:31 PM  Pager: [phone]

## 2016-11-22 NOTE — ED Triage Notes (Signed)
Pt from home with ems with c.o hypoxia. Pt was found lying in floor by [facility] today with o2 lying beside her. Pt normally on 2-3L o2 [place]. Pt was pale and disoriented upon ems arrival with sats 50-60%. Pt placed on 6L [place] at brought up to 96%. Per [facility] pt has had a cough and congestion for the past 2 days but denies any fever or chills. Pt arousable by voice, oriented to person and place, denies any pain. 20G LAC. nad at this time

## 2016-11-22 NOTE — ED Notes (Signed)
Xray at bedside

## 2016-11-22 NOTE — Progress Notes (Signed)
ANTICOAGULATION CONSULT NOTE - Initial Consult  Pharmacy Consult for Heparin Indication: chest pain/ACS  Allergies  Allergen Reactions  . Fluconazole Anaphylaxis and Itching  . Atorvastatin Other (See Comments)    Dizziness     Patient Measurements: Height: 5' 5" (165.1 cm) Weight: 266 lb (120.7 kg) IBW/kg (Calculated) : 57 Heparin Dosing Weight: 86 kg  Vital Signs: Temp: 99.3 F (37.4 C) (08/21 1656) Temp Source: Oral (08/21 1656) BP: 87/69 (08/21 2030) Pulse Rate: 97 (08/21 2036)  Labs:  Recent Labs  11/14/2016 1746  HGB 9.5*  HCT 33.0*  PLT 258  CREATININE 3.31*    Estimated Creatinine Clearance: 23 mL/min (A) (by C-G formula based on SCr of 3.31 mg/dL (H)).   Medical History: Past Medical History:  Diagnosis Date  . Arthritis    "some scattered" (03/03/2015)  . CHF (congestive heart failure) ([place])   . Chronic bronchitis ([place])    "get it most years" (03/03/2015)  . COPD (chronic obstructive pulmonary disease) ([facility])    Severe. Gold Stage IV.  PFTs (12/2008) - severe obstructive airway disease. Active tobacco use. Requires 4L O2 at home.  . Coronary artery disease    S/P PCI of LAD with DES (12/2008). Total occlusion of RCA noted at that time., medically managed. ACS ruled out 03/2009 with Lexiscan myoview . Followed by [facility].  . Depression   . Diabetes mellitus without complication ([place])   . Diastolic dysfunction    2-D Echo (12/2008) - Normal LV Systolic funciton with EF 60-65%. Grade 1 diastolid dysfunction. No regional wall motion abnormalities. Moderate pulmonary HTN with PA peak pressure 38mHg.  . Full dentures   . GERD (gastroesophageal reflux disease)    S/P Nissen fundoplication.  .Marland KitchenHistory of hiatal hernia   . Hx MRSA infection    Recurrent MRSA thigh abscesses.  . Hyperlipidemia   . Lung cancer ([place]    "left"  . Obesity   . On home oxygen therapy since 2010   4L all the time  . Pneumonia   . Prediabetes    HgbA1c 6.4 (12/2008)  .  Pulmonary hypertension ([place]    2-D Echo (020/5050 - Systolic pressure was moderately increased. PA peak pressure  524mg. 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 Followed by Dr. WrJoya GaskinsLVelora Heckler . Pulmonary nodule, right    Small right middle lobe nodule. Stable as of 12/2008.  . Marland Kitchenhortness of breath dyspnea    with a lot of exertion; if fluid builds up    Medications:   (Not in a hospital admission)  Assessment: [id number] who presented to the ED with elevated troponin and CHF exacerbation. Pharmacy consulted to start IV heparin for ACS. H/H 9.5/33. Plt wnl. Patient is not on any anticoagulants prior to admission. Of note, patient's SCr is acutely elevated at 3.31.  Goal of Therapy:  Heparin level 0.3-0.7 units/ml Monitor platelets by anticoagulation protocol: Yes   Plan:  -Heparin 4000 units IV bolus, then IV heparin at 1000 units/hr  -F/u 8 hr HL  -Monitor daily HL, CBC and s/s of bleeding   BeAlbertina ParrPharmD., BCPS Clinical Pharmacist Pager [phone]

## 2016-11-22 NOTE — ED Notes (Signed)
Pt placed on PureWick for urine sample.

## 2016-11-22 NOTE — Consult Note (Signed)
Cardiology Consultation:   Patient ID: [name]; [id number]; 10-Mar-1956   Admit date: 11/18/2016 Date of Consult: 11/10/2016  Primary Care Provider: Oval Linsey, MD Primary Cardiologist: Dr. Sung Amabile Primary Electrophysiologist:  None   Patient Profile:   [name] is a 61 y.o. female with a hx of CAD s/p PCI 2010, pulmonary HTN, COPD on 4L va [place] at home chronically, chronic diastolic HF ECHO 10/02/14 EF 55-60%, G1DD, obesity and primary adenocarcinoma of left lung who is being seen today for the evaluation of elevated troponin and CHF exacerbation at the request of Dr. Lita Mains.  History of Present Illness:   [name] last saw Dr. Haroldine Laws in the clinic on 12/23/2015 for follow-up. At this time she had mostly stable weight at 230 lb and was taking 80 mg lasix daily with PRN lasix for weight fluctuations, then metolazone if still no improvement.   She had an admission on August 13-17 for acute respiratory failure. The chart notes the CT scan of the chest showed progression of her left upper lung pulmonary nodules. 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 It was removed by cardiothoracic surgery that admission.  Today she came to the ER by EMS after being found by the home health nurse laying on the floor with hery oxygen beside her. Her sats were initially in the 50-60s but this improved when her home O2 was replaced (4 L [place]) She also told the ER doctor she has had some central chest pressure. She is currently on BiPap and having difficulty speaking.   Chest xray shows stable cardiomegaly with central pulmonary vascular congestion. Aortic atherosclerosis. Mild bilateral pulmonary edema and mild left pleural effusion. EKG with Sinus tachycardia, rate 107, atrial premature complexes. Troponin poc is elevated to 1.18. Her troponin is usually negative previous admissions.  BNP is elevated to 654.8 which is  high for her, baseline appears to be 60-100. Creatinine is acutely worse than normal at 3.31, approximately 1 week ago her creatinine was 2.40. Potassium is normal. Hemoglobin is baseline at 9.5.  I am unable to interview the patient due to BiPap and being unable to understand her. No one else present in room for assistance, HR 110 and temp 99.3 BP readings initially soft at 94/63. In room they were reading systolic > 010, I have asked nurse to recheck this and document.    Past Medical History:  Diagnosis Date  . Arthritis    "some scattered" (03/03/2015)  . CHF (congestive heart failure) ([place])   . Chronic bronchitis ([place])    "get it most years" (03/03/2015)  . COPD (chronic obstructive pulmonary disease) ([facility])    Severe. Gold Stage IV.  PFTs (12/2008) - severe obstructive airway disease. Active tobacco use. Requires 4L O2 at home.  . Coronary artery disease    S/P PCI of LAD with DES (12/2008). Total occlusion of RCA noted at that time., medically managed. ACS ruled out 03/2009 with Lexiscan myoview . Followed by [facility].  . Depression   . Diabetes mellitus without complication ([place])   . Diastolic dysfunction    2-D Echo (12/2008) - Normal LV Systolic funciton with EF 60-65%. Grade 1 diastolid dysfunction. No regional wall motion abnormalities. Moderate pulmonary HTN with PA peak pressure 16mHg.  . Full dentures   . GERD (gastroesophageal reflux disease)    S/P Nissen fundoplication.  .Marland KitchenHistory of hiatal hernia   . Hx MRSA infection  Recurrent MRSA thigh abscesses.  . Hyperlipidemia   . Lung cancer ([place])    "left"  . Obesity   . On home oxygen therapy since 2010   4L all the time  . Pneumonia   . Prediabetes    HgbA1c 6.4 (12/2008)  . Pulmonary hypertension ([place])    2-D Echo (78/2956) - Systolic pressure was moderately increased. PA peak pressure  54mHg. secondary pulm htn likely on basis of comb of interstital lung disease, severe copd, small airways disease, severe sleep  apnea and cor pulmonale,. Followed by Dr. WJoya Gaskins(Vel[facility] Heckler  . Pulmonary nodule, right    Small right middle lobe nodule. Stable as of 12/2008.  .Marland KitchenShortness of breath dyspnea    with a lot of exertion; if fluid builds up    Past Surgical History:  Procedure Laterality Date  . ABDOMINAL HYSTERECTOMY  1980's   "still have my cervix"  . APPENDECTOMY    . BACK SURGERY    . BILATERAL OOPHORECTOMY  1979  . BREAST LUMPECTOMY Right 1990's    (biopsy negative)  . CHEST TUBE INSERTION Left 09/23/2016   Procedure: INSERTION LEFT PLEURAL DRAINAGE CATHETER;  Surgeon: VIvin Poot MD;  [facility]  Service: Thoracic;  Laterality: Left;  . CORONARY ANGIOPLASTY WITH STENT PLACEMENT  2010   [facility]  . HERNIA REPAIR    . IR THORACENTESIS ASP PLEURAL SPACE W/IMG GUIDE  07/18/2016  . IR THORACENTESIS ASP PLEURAL SPACE W/IMG GUIDE  08/24/2016  . KNEE ARTHROSCOPY Right ~ 2000  . LPampaSURGERY  2001   "herniated discs; [facility] Y[facility]  . NISSEN FUNDOPLICATION    . RIGHT HEART CATHETERIZATION N/A 11/09/2012   Procedure: RIGHT HEART CATH;  Surgeon: DLarey Dresser MD;  [facility];  Service: Cardiovascular;  Laterality: N/A;  . TONSILLECTOMY    . TUBAL LIGATION  1979  . VIDEO BRONCHOSCOPY WITH ENDOBRONCHIAL ULTRASOUND N/A 12/15/2014   Procedure: VIDEO BRONCHOSCOPY WITH ENDOBRONCHIAL ULTRASOUND;  Surgeon: PIvin Poot MD;  [facility];  Service: Thoracic;  Laterality: N/A;     Inpatient Medications: Scheduled Meds:  Continuous Infusions:  PRN Meds:   Allergies:    Allergies  Allergen Reactions  . Fluconazole Anaphylaxis and Itching  . Atorvastatin Other (See Comments)    Dizziness     Social History:   Social History   Social History  . Marital status: Divorced    Spouse name: N/A  . Number of children: N/A  . Years of education: N/A   Occupational History  . Not on file.   Social History Main Topics  . Smoking status: Former Smoker     Packs/day: 1.00    Years: 45.00    Types: Cigarettes    Start date: 04/04/1969    Quit date: 11/04/2015  . Smokeless tobacco: Never Used     Comment: 1 cig per day  . Alcohol use No  . Drug use: No  . Sexual activity: Not Currently    Birth control/ protection: None   Other Topics Concern  . Not on file   Social History Narrative   Formerly worked as a cScientist, water quality now disabled.   Divorced.   2 grown children.   Lives with her grandson.    Family History:   Family History  Problem Relation Age of Onset  . Heart disease Mother 477      Deceased from MI at 488yo . Hypertension Mother   .  Heart disease Father 64       Deceased of MI age 45yo  . Hypertension Father   . Hypertension Brother   . Lung cancer Unknown        Grandmother     ROS:  Please see the history of present illness.  ROS  All other ROS reviewed and negative.     Physical Exam/Data:   Vitals:   11/14/2016 1656 11/20/2016 1738 12/02/2016 1827 11/21/2016 1853  BP: 120/89 98/62 94/63   Pulse: (!) 110  (!) 104 (!) 103  Resp: (!) _0 Temp: 99.3 F (37.4 C)     TempSrc: Oral     SpO2: 94%  92% 99%  Weight:      Height:       No intake or output data in the 24 hours ending 11/10/2016 1950 Filed Weights   11/12/2016 1650  Weight: 266 lb (120.7 kg)   Body mass index is 44.26 kg/m.  General:  Well nourished, well developed + respiratory distress on bipap HEENT: normal Lymph: no adenopathy Neck: difficult to assess JVD as patient unable to hold her head up at this time. Endocrine:  No thryomegaly Vascular: No carotid bruits; FA pulses 2+ bilaterally without bruits  Cardiac:  Tachycardia, no murmurs Lungs:  Crackles to the lung bases, respiratory distress requiring bipap  Abd: soft, nontender, no hepatomegaly  Ext: no edema Musculoskeletal:  No deformities, BUE and BLE strength normal and equal. + lower extremity edema. Skin: warm and dry  Neuro:  CNs 2-12 intact, no focal abnormalities noted Psych:   Normal affect   EKG:  The EKG was personally reviewed and demonstrates:  Sinus tachycardia with premature atrial complexes.   Relevant CV Studies:  Study Conclusions  - Left ventricle: The cavity size was normal. Wall thickness was   normal. Systolic function was normal. The estimated ejection   fraction was in the range of 55% to 60%. Doppler parameters are   consistent with abnormal left ventricular relaxation (grade 1   diastolic dysfunction). - Aortic valve: Mildly calcified annulus. Normal thickness   leaflets. Valve area (VTI): 2.7 cm^2. Valve area (Vmax): 2.74   cm^2. - Mitral valve: Mildly calcified annulus. Normal thickness leaflets   . - Left atrium: The atrium was mildly dilated. - Right ventricle: The cavity size was mildly dilated. - Right atrium: The atrium was mildly dilated. - Technically difficult study.  Laboratory Data:  Chemistry Recent Labs Lab 11/17/16 0236 11/18/16 0247 11/29/2016 1746  NA 142 142 142  K 4.2 4.4 4.7  CL 98* 98* 100*  CO2 38* 38* 33*  GLUCOSE 105* 172* 188*  BUN 21* 22* 18  CREATININE 2.56* 2.40* 3.31*  CALCIUM 8.4* 8.5* 8.2*  GFRNONAA 19* 21* 14*  GFRAA 22* 24* 16*  ANIONGAP _1 Recent Labs Lab 11/20/2016 1746  PROT 6.4*  ALBUMIN 2.8*  AST 831*  ALT 364*  ALKPHOS 87  BILITOT 1.2   Hematology Recent Labs Lab 11/17/16 0236 11/18/16 0247 12/01/2016 1746  WBC 10.2 10.4 14.1*  RBC 2.95* 3.03* 3.39*  HGB 8.4* 8.4* 9.5*  HCT 28.6* 28.9* 33.0*  MCV 96.9 95.4 97.3  MCH 28.5 27.7 28.0  MCHC 29.4* 29.1* 28.8*  RDW 14.4 14.1 15.0  PLT 274 272 258   Cardiac EnzymesNo results for input(s): TROPONINI in the last 168 hours.  Recent Labs Lab 11/08/2016 1759  TROPIPOC 1.18*    BNP Recent Labs Lab 11/29/2016 1746  BNP 654.8*    DDimer No results for input(s): DDIMER in the last 168 hours.  Radiology/Studies:  Dg Chest Port 1 View  Result Date: 11/02/2016 CLINICAL DATA:  Shortness of breath. EXAM: PORTABLE CHEST 1  VIEW COMPARISON:  Radiographs of November 17, 2016. FINDINGS: Stable cardiomegaly with central pulmonary vascular congestion. Atherosclerosis of thoracic aorta is noted. No pneumothorax is noted. Mild bilateral perihilar and basilar pulmonary edema is noted. Mild left pleural effusion is noted with probable associated atelectasis. Bony thorax is unremarkable. IMPRESSION: Stable cardiomegaly with central pulmonary vascular congestion. Aortic atherosclerosis. Mild bilateral pulmonary edema is noted with mild left pleural effusion and associated atelectasis. Electronically Signed   By: Marijo Conception, M.D.   On: 11/20/2016 18:01    Assessment and Plan:   Blood pressure 94/63, pulse (!) 103, temperature 99.3 F (37.4 C), temperature source Oral, resp. rate 17, height _0  (1.651 m), weight 266 lb (120.7 kg), SpO2 99 %.  has a past medical history of Arthritis; CHF (congestive heart failure) ([place]); Chronic bronchitis ([place]); COPD (chronic obstructive pulmonary disease) ([place]); Coronary artery disease; Depression; Diabetes mellitus without complication ([place]); Diastolic dysfunction; Full dentures; GERD (gastroesophageal reflux disease); History of hiatal hernia; MRSA infection; Hyperlipidemia; Lung cancer ([place]); Obesity; On home oxygen therapy (since 2010); Pneumonia; Prediabetes; Pulmonary hypertension ([place]); Pulmonary nodule, right; and Shortness of breath dyspnea.  1. Acute on chronic respiratory failure: found on the ground without home oxygen O2 sats at 50% on room air, improved with oxygen  Now requiring BiPap. She has had hx of malignant exudative effusions and had her PleurX taken out the last admission. She is fluid overloaded with elevated BNP and changes to her chest xray. -- will order Lasix IV 80 mg BID. Her creatinine is elevated > 3, will watch very closely, hopefully it will improve with diuresis. -- She has mild pleural effusions, watch these closely and consider Chest CT/ cardiothoracic consult  if concern for worsening/reoccurance of exudative effusion. -- consider reaching out to the heart failure team in the AM since she is Dr. Valarie Cones patient.  2. Elevated Troponin with hx of CAD: Her troponin is 1.18. This is likely demand ischemia in the setting of acute on chronic congestive heart failure and respiratory failure. Given her worsening lung cancer, I doubt she would be a good candidate for invasive interventions. At this time will continue to monitor. She is no longer having chest pain at this time.  3. Acute on chronic CKD: Creatinine is ~ 2.4 at baseline. Today is is 3.31, watch this closely as we diurese.  4. Lung cancer: Per chart review (09/24/16) she has a hx of stage II adenocarcinoma of left lung treated with primary radiation.  -- CT of chest 11/15/2016 showed interval progression of left upper lung pulmonary nodule. This was done without contrast.  5.  DM: Primary team to manage.  Kristopher Glee, PA-C  11/14/2016 @  The patient was seen, examined and discussed with Delos Haring, PA-C and I agree with the above.   60 y.o. female with a hx of CAD s/p PCI 2010, pulmonary HTN, COPD on 4L va [place] at home chronically, chronic diastolic HF ECHO 08/07/67 EF 55-60%, G1DD, obesity and stage 4 primary adenocarcinoma of left lung who is being seen today for the evaluation of elevated troponin and CHF exacerbation at the request of Dr. Lita Mains. She was just discharged on 11/17/16 foe an acute respiratory failure into palliative care. She had a  PleurX catheter for  persistent exudative left pleural effusions felt to be malignant but cytologies negative. It was removed by cardiothoracic surgery that admission.  She was previously followed by Dr Haroldine Laws, last seen in 12/2015, weight 230-240 at the time. She was taking 80 mg lasix daily with PRN lasix for weight fluctuations, then metolazone if still no improvement.   Today she came to the ER by EMS after being found by the  home health nurse laying on the floor with hery oxygen beside her. Her sats were initially in the 50-60s but this improved when her home O2 was replaced (4 L [place]).Initially on BiPAP, now on 4 L o2 via [place], somnolent, falling asleep during the conversation.  Physical exam reveals elevated JVDs, B/L crackles up to mid lungs, mild B/L LE edema.  Chest xray shows stable cardiomegaly with central pulmonary vascular congestion. Aortic atherosclerosis. Mild bilateral pulmonary edema and mild left pleural effusion.  Troponin poc is elevated to 1.18. BNP is elevated to 654.8,  Creatinine is acutely worse than normal at 3.31, approximately 1 week ago her creatinine was 2.40. Hemoglobin is baseline at 9.5.  A/P:  Acute on chronic respiratory failure with hypoxia Acute on chronic diastolic CHF Troponin elevation  Acute on chronic kidney failure  We will start iv diuresis with Lasix 80 mg iv BID, follow Crea and K very closely. Troponin elevation most probably sec to CHF and acute on chronic kidney failure, with troponin > 1 possibly ischemia. However, in the settings of stage 4 cancer and CHD stage 5 we will proceed with ischemic workup. Heparin infusion should be administered until troponin is downtrending.   Ena Dawley, MD 11/06/2016

## 2016-11-22 NOTE — Progress Notes (Signed)
Patient taken off BiPAP for a break to talk with MD's.

## 2016-11-22 NOTE — ED Notes (Signed)
Pt remains on Bi-Pap and tolerating well.

## 2016-11-23 ENCOUNTER — Encounter (HOSPITAL_COMMUNITY): Payer: Self-pay | Admitting: *Deleted

## 2016-11-23 DIAGNOSIS — J90 Pleural effusion, not elsewhere classified: Secondary | ICD-10-CM

## 2016-11-23 DIAGNOSIS — I5032 Chronic diastolic (congestive) heart failure: Secondary | ICD-10-CM

## 2016-11-23 DIAGNOSIS — N189 Chronic kidney disease, unspecified: Secondary | ICD-10-CM

## 2016-11-23 DIAGNOSIS — I252 Old myocardial infarction: Secondary | ICD-10-CM

## 2016-11-23 DIAGNOSIS — N179 Acute kidney failure, unspecified: Secondary | ICD-10-CM

## 2016-11-23 LAB — URINALYSIS, ROUTINE W REFLEX MICROSCOPIC
Bilirubin Urine: NEGATIVE
GLUCOSE, UA: NEGATIVE mg/dL
Ketones, ur: NEGATIVE mg/dL
Leukocytes, UA: NEGATIVE
NITRITE: NEGATIVE
Protein, ur: 100 mg/dL — AB
SPECIFIC GRAVITY, URINE: 1.012 (ref 1.005–1.030)
pH: 5 (ref 5.0–8.0)

## 2016-11-23 LAB — COMPREHENSIVE METABOLIC PANEL
ALBUMIN: 2.7 g/dL — AB (ref 3.5–5.0)
ALK PHOS: 82 U/L (ref 38–126)
ALT: 832 U/L — AB (ref 14–54)
AST: 1882 U/L — ABNORMAL HIGH (ref 15–41)
Anion gap: 10 (ref 5–15)
BILIRUBIN TOTAL: 0.9 mg/dL (ref 0.3–1.2)
BUN: 25 mg/dL — ABNORMAL HIGH (ref 6–20)
CALCIUM: 7.9 mg/dL — AB (ref 8.9–10.3)
CO2: 31 mmol/L (ref 22–32)
CREATININE: 3.85 mg/dL — AB (ref 0.44–1.00)
Chloride: 101 mmol/L (ref 101–111)
GFR calc Af Amer: 13 mL/min — ABNORMAL LOW (ref >60)
GFR calc non Af Amer: 12 mL/min — ABNORMAL LOW (ref >60)
GLUCOSE: 178 mg/dL — AB (ref 65–99)
Potassium: 4.5 mmol/L (ref 3.5–5.1)
SODIUM: 142 mmol/L (ref 135–145)
Total Protein: 6 g/dL — ABNORMAL LOW (ref 6.5–8.1)

## 2016-11-23 LAB — CBC
HEMATOCRIT: 30.6 % — AB (ref 36.0–46.0)
HEMOGLOBIN: 8.8 g/dL — AB (ref 12.0–15.0)
MCH: 28.3 pg (ref 26.0–34.0)
MCHC: 28.8 g/dL — ABNORMAL LOW (ref 30.0–36.0)
MCV: 98.4 fL (ref 78.0–100.0)
Platelets: 210 10*3/uL (ref 150–400)
RBC: 3.11 MIL/uL — AB (ref 3.87–5.11)
RDW: 15.6 % — ABNORMAL HIGH (ref 11.5–15.5)
WBC: 16.7 10*3/uL — ABNORMAL HIGH (ref 4.0–10.5)

## 2016-11-23 LAB — TROPONIN I
Troponin I: 2.49 ng/mL (ref <0.03)
Troponin I: 2.65 ng/mL (ref <0.03)
Troponin I: 2.1 ng/mL (ref <0.03)

## 2016-11-23 LAB — PROTIME-INR
INR: 1.32
Prothrombin Time: 16.5 seconds — ABNORMAL HIGH (ref 11.4–15.2)

## 2016-11-23 LAB — HEPARIN LEVEL (UNFRACTIONATED)
Heparin Unfractionated: 0.26 IU/mL — ABNORMAL LOW (ref 0.30–0.70)
Heparin Unfractionated: 0.18 IU/mL — ABNORMAL LOW (ref 0.30–0.70)

## 2016-11-23 LAB — GLUCOSE, CAPILLARY
GLUCOSE-CAPILLARY: 318 mg/dL — AB (ref 65–99)
Glucose-Capillary: 197 mg/dL — ABNORMAL HIGH (ref 65–99)

## 2016-11-23 LAB — APTT: aPTT: 56 seconds — ABNORMAL HIGH (ref 24–36)

## 2016-11-23 MED ORDER — IPRATROPIUM-ALBUTEROL 0.5-2.5 (3) MG/3ML IN SOLN
RESPIRATORY_TRACT | Status: AC
  Administered 2016-11-23: 3 mL
  Filled 2016-11-23: qty 3

## 2016-11-23 MED ORDER — DEXTROSE 5 % IV SOLN
2.0000 g | INTRAVENOUS | Status: DC
  Administered 2016-11-23: 2 g via INTRAVENOUS
  Filled 2016-11-23: qty 2

## 2016-11-23 MED ORDER — IPRATROPIUM-ALBUTEROL 0.5-2.5 (3) MG/3ML IN SOLN
3.0000 mL | Freq: Three times a day (TID) | RESPIRATORY_TRACT | Status: DC
  Administered 2016-11-23 – 2016-11-25 (×6): 3 mL via RESPIRATORY_TRACT
  Filled 2016-11-23 (×8): qty 3

## 2016-11-23 MED ORDER — DEXTROSE 5 % IV SOLN
1.0000 g | INTRAVENOUS | Status: DC
  Administered 2016-11-24: 1 g via INTRAVENOUS
  Filled 2016-11-23 (×2): qty 1

## 2016-11-23 NOTE — Progress Notes (Signed)
Subjective: Patient seen and examined. She states she is feeling better this morning and requested being taken off BiPAP mask. Patient states that after being discharged last Thursday she did fairly well. She cannot recall a fall or even that lead to her be on found on the floor without oxygen. She was more alert and responsive than documented in the ED. She has no other complaints at this time.   Objective:  Vital signs in last 24 hours: Vitals:   11/23/16 1130 11/23/16 1148 11/23/16 1200 11/23/16 1300  BP:  (!) 104/59 (!) 104/53 (!) 101/59  Pulse: (!) 101 (!) 104 100 100  Resp: 20 17 (!) 21 (!) 23  Temp:  (!) 97.4 F (36.3 C)    TempSrc:  Oral    SpO2: 100% 99% 99% 98%  Weight:      Height:       General: Ill appearing, Laying in bed, tremulous  HEENT: [place]/AT, EOMI, no scleral icterus Cardiac: RRR Pulm: normal effort, +bibasilar crackles Abd: soft, non tender, non distended, +large bruise on her left flank near previous PleurX catheter site, dressing was clean and dry Ext: extremities well perfused, trace pitting edema Neuro: alert and oriented X3, cranial nerves II-XII grossly intact   Assessment/Plan:  Active Problems:   Hypoxia  Acute on chronic hypercarbic hypoxic respiratory failure: Multifactorial, but acute hypoxemia exacerbated by interruption in home oxygen for unknown length of time. Patient has significant pulmonary disease including COPD on 4 L chronically, chronic left pleural effusion related to lung cancer, and left lung volume los s/p radiation, she also has a history of HFpEF. Febrile this morning with concern for pneumonia. She was taken off BiPAP and returned to [place] 6-8 L.  - Cefepime 2 mg Q24 Hr  - Supplemental oxygen, titrate to maintain saturations between 88-92% - Albuterol Neb q4h PRN - Continue Duoneb BID - Continue Fluticasone furoate-vilanterol (BREO ELLIPTA) 1 Puff Daily - Held morning Lasix dose 18m   NSTEMI, Type II Troponin 1.18 on  admission, continued to trend upward>>2.22, 2.49, 2.65>>2.10. Most likely due to profound hypoxemia, and possibly hypotension. Unlikely related to HF, not volume overloaded on exam. -Continue Heparin infusion for medical management of NSTEMI -Trend Troponin - Continue ASA 817mDaily  - Continue rosuvastatin 2060maily  Acute on chronic renal failure Cr 3.1 on admission, increased to 3.85. Most likely secondary to hypoxemia and hypotension.  - Held morning IV Lasix - Repeat CMP in AM  Transaminitis  AST 1882, ALT 832 consistent with shock liver secondary to hypoxemia and hypotension.  -CMP in AM  Dispo: Anticipated discharge in approximately 2-3 day(s).   LacMelanee SpryD 11/23/2016, 1:32 PM Pager: [phone]

## 2016-11-23 NOTE — Progress Notes (Signed)
[facility]  Patient Status: Active (receiving services up to time of hospitalization)  [facility] is providing the following services: RN and MSW  If patient discharges after hours, please call [phone].   [name] 11/23/2016, 11:57 AM

## 2016-11-23 NOTE — Progress Notes (Addendum)
Patient placed back on Bipap by RN at 1130 to maintain adequate sats. Patients vitals are stable, patient is resting comfortably, RT will continue to monitor.

## 2016-11-23 NOTE — Consult Note (Addendum)
   [facility] CM Inpatient Consult   11/23/2016  [name] [birth year] [id number]   Noted [name] hospitalization. She was recently followed by [facility]. However, she was enrolled in the Palliative program with [facility]. Writer confirmed this during last hospitalization.  Chart reviewed. Noted palliative consult pending.  Left voicemail message for inpatient RNCM regarding above.   Marthenia Rolling, MSN-Ed, RN,BSN [facility] Liaison [phone]

## 2016-11-23 NOTE — Progress Notes (Signed)
CRITICAL VALUE ALERT  Critical Value:  Troponin 2.65  Date & Time Notied:  11/23/16 @ 6433  Provider Notified: Chriss Driver  Orders Received/Actions taken: not at this time

## 2016-11-23 NOTE — Consult Note (Signed)
Advanced Heart Failure Team Consult Note   Primary Physician:  Oval Linsey, MD Primary Cardiologist:  Dr. Haroldine Laws  Reason for Consultation: Acute on chronic diastolic CHF  HPI:    [name] is seen today for evaluation of acute on chronic diastolic CHF at the request of Dr. Meda Coffee   [name] is a 61 year old female with a past medical history of COPD, diastolic CHF, CKD stage IV, CAD s/p DES to LAD in 2010, DM and lung cancer.   [name] has had 5 admissions in the last 6 months primarily for respiratory failure. Most recently admitted 11/14/16-11/18/16 for COPD exacerbation, treated with steroids. CT scan showed progression of left upper lung pulmonary nodules. She also had her PleurX catheter removed that admission. She was discharge home with home health on her usual 4L of oxygen.   She was found lying on the floor in her home on 11/21/2016 by a home health nurse. O2 saturations were in the 50's initially, EMS was called and she was placed on Bipap. Pertinent admission labs - WBC 14.2, troponin 1.18, lactate 1.73, BNP 654. She says that she is SOB most of the time, but symptoms have worsened over the past week. Her lasix was recently reduced reduced by her PCP according to her for worsening CKD. Review of chart shows that home health visits from [facility] note dietary noncompliance and intermittent compliance with medications.   Weight at last clinic visit was 235 pounds (Sept. 2017), so almost a year ago. However, weights over the past 6 months have been 2665-268. She was 268 pounds in April.    Review of Systems: [y] = yes, _0  = no   General: Weight gain Blue.Reese ]; Weight loss _1 ; Anorexia _2 ; Fatigue Blue.Reese ]; Fever _3 ; Chills _4 ; Weakness _5   Cardiac: Chest pain/pressure _6 ; Resting SOB Blue.Reese ]; Exertional SOB [ y]; Orthopnea Blue.Reese ]; Pedal Edema _7 ; Palpitations _8 ; Syncope _9 ; Presyncope _10 ; Paroxysmal nocturnal dyspnea_11   Pulmonary: Cough _12 ; Wheezing_13 ; Hemoptysis_14 ; Sputum [  ]; Snoring _15   GI: Vomiting_16 ; Dysphagia_17 ; Melena_18 ; Hematochezia _19 ; Heartburn_20 ; Abdominal pain _21 ; Constipation _22 ; Diarrhea _23 ; BRBPR _24   GU: Hematuria_25 ; Dysuria _26 ; Nocturia_27   Vascular: Pain in legs with walking _28 ; Pain in feet with lying flat _29 ; Non-healing sores _30 ; Stroke _31 ; TIA _32 ; Slurred speech _33 ;  Neuro: Headaches_34 ; Vertigo_35 ; Seizures_36 ; Paresthesias_37 ;Blurred vision _38 ; Diplopia _39 ; Vision changes _40   Ortho/Skin: Arthritis [ y]; Joint pain _41 ; Muscle pain _42 ; Joint swelling _43 ; Back Pain _44 ; Rash _45   Psych: Depression_46 ; Anxiety_47   Heme: Bleeding problems _48 ; Clotting disorders _49 ; Anemia _50   Endocrine: Diabetes _51 ; Thyroid dysfunction_52   Home Medications Prior to Admission medications   Medication Sig Start Date End Date Taking? Authorizing Provider  acetaminophen-codeine (TYLENOL #3) 300-30 MG tablet Take 1 tablet by mouth every 6 (six) hours as needed for severe pain. 09/24/16  Yes Hoffman, Jessica Ratliff, DO  albuterol (VENTOLIN HFA) 108 (90 Base) MCG/ACT inhaler Inhale 1-2 puffs into the lungs every 6 (six) hours as needed for shortness of breath. 02/03/16  Yes Oval Linsey, MD  aspirin 81 MG chewable tablet Chew 1 tablet (81 mg total) by mouth at bedtime. 01/16/15  Yes  Oval Linsey, MD  buPROPion [facility] SR) 150 MG 12 hr tablet Take 1 tablet (150 mg total) by mouth 2 (two) times daily. 10/03/16  Yes Oval Linsey, MD  fluticasone furoate-vilanterol (BREO ELLIPTA) 100-25 MCG/INH AEPB Inhale 1 puff into the lungs daily. 11/19/16  Yes Lacroce, Hulen Shouts, MD  Fluticasone-Umeclidin-Vilant (TRELEGY ELLIPTA) 100-62.5-25 MCG/INH AEPB Inhale 1 puff into the lungs daily. 10/03/16  Yes Oval Linsey, MD  furosemide (LASIX) 40 MG tablet Take 1 tablet (40 mg total) by mouth daily. 08/25/16  Yes Molt, Bethany, DO  gabapentin (NEURONTIN) 300 MG capsule Take 2 capsules (600 mg total) by mouth at bedtime. 08/05/16  Yes Oval Linsey, MD    ipratropium-albuterol (DUONEB) 0.5-2.5 (3) MG/3ML SOLN Take 3 mLs by nebulization 2 (two) times daily. 11/18/16  Yes Lacroce, Hulen Shouts, MD  metolazone (ZAROXOLYN) 2.5 MG tablet TAKE 1 TABLET BY MOUTH AS NEEDED(FOR 5 LBS WEIGHT GAIN) Patient taking differently: Take 2.5 mg by mouth once a day as needed for a weight gain of 5 pounds 05/13/15  Yes [name] Friar, PA-C  pantoprazole (PROTONIX) 40 MG tablet Take 1 tablet (40 mg total) by mouth daily. 11/05/15  Yes Oval Linsey, MD  polyethylene glycol [facility] / GLYCOLAX) packet Take 17 g by mouth daily as needed for moderate constipation or severe constipation. 08/24/16  Yes Molt, Bethany, DO  predniSONE (DELTASONE) 20 MG tablet Take 2 tablets (40 mg total) by mouth daily with breakfast. 11/18/16  Yes Lacroce, Hulen Shouts, MD  Pyridoxine HCl (VITAMIN B-6 PO) Take 1 tablet by mouth daily.   Yes [provider]  ranitidine (ZANTAC) 300 MG tablet Take 1 tablet (300 mg total) by mouth at bedtime. 08/05/16  Yes Oval Linsey, MD  rosuvastatin (CRESTOR) 20 MG tablet Take 1 tablet (20 mg total) by mouth daily. Patient taking differently: Take 20 mg by mouth at bedtime.  10/03/16  Yes Oval Linsey, MD  senna (SENOKOT) 8.6 MG TABS tablet Take 2 tablets (17.2 mg total) by mouth daily as needed for mild constipation or moderate constipation. 08/24/16  Yes Molt, Bethany, DO  ipratropium-albuterol (DUONEB) 0.5-2.5 (3) MG/3ML SOLN USE 3 ML VIA NEBULIZER EVERY 6 HOURS AS NEEDED FOR SHORTNESS OF BREATH Patient not taking: Reported on 11/21/2016 10/19/16   Annia Belt, MD    Past Medical History: Past Medical History:  Diagnosis Date  . Arthritis    "some scattered" (03/03/2015)  . CHF (congestive heart failure) ([place])   . Chronic bronchitis ([place])    "get it most years" (03/03/2015)  . COPD (chronic obstructive pulmonary disease) (HCC)    Severe. Gold Stage IV.  PFTs (12/2008) - severe obstructive airway disease. Active tobacco use.  Requires 4L O2 at home.  . Coronary artery disease    S/P PCI of LAD with DES (12/2008). Total occlusion of RCA noted at that time., medically managed. ACS ruled out 03/2009 with Lexiscan myoview . Followed by [place].  . Depression   . Diabetes mellitus without complication ([place])   . Diastolic dysfunction    2-D Echo (12/2008) - Normal LV Systolic funciton with EF 60-65%. Grade 1 diastolid dysfunction. No regional wall motion abnormalities. Moderate pulmonary HTN with PA peak pressure 46mHg.  . Full dentures   . GERD (gastroesophageal reflux disease)    S/P Nissen fundoplication.  .Marland KitchenHistory of hiatal hernia   . Hx MRSA infection    Recurrent MRSA thigh abscesses.  . Hyperlipidemia   . Lung cancer ([place]    "left"  .  Obesity   . On home oxygen therapy since 2010   4L all the time  . Pneumonia   . Prediabetes    HgbA1c 6.4 (12/2008)  . Pulmonary hypertension ([place])    2-D Echo (63/8177) - Systolic pressure was moderately increased. PA peak pressure  6mHg. 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 Followed by Dr. WJoya Gaskins(Velora Heckler  . Pulmonary nodule, right    Small right middle lobe nodule. Stable as of 12/2008.  .Marland KitchenShortness of breath dyspnea    with a lot of exertion; if fluid builds up    Past Surgical History: Past Surgical History:  Procedure Laterality Date  . ABDOMINAL HYSTERECTOMY  1980's   "still have my cervix"  . APPENDECTOMY    . BACK SURGERY    . BILATERAL OOPHORECTOMY  1979  . BREAST LUMPECTOMY Right 1990's    (biopsy negative)  . CHEST TUBE INSERTION Left 09/23/2016   Procedure: INSERTION LEFT PLEURAL DRAINAGE CATHETER;  Surgeon: VIvin Poot MD;  [facility]  Service: Thoracic;  Laterality: Left;  . CORONARY ANGIOPLASTY WITH STENT PLACEMENT  2010   [facility]  . HERNIA REPAIR    . IR THORACENTESIS ASP PLEURAL SPACE W/IMG GUIDE  07/18/2016  . IR  THORACENTESIS ASP PLEURAL SPACE W/IMG GUIDE  08/24/2016  . KNEE ARTHROSCOPY Right ~ 2000  . [facility]  2001   "[facility]  . NISSEN FUNDOPLICATION    . RIGHT HEART CATHETERIZATION N/A 11/09/2012   Procedure: RIGHT HEART CATH;  Surgeon: DLarey Dresser MD;  [facility];  Service: Cardiovascular;  Laterality: N/A;  . TONSILLECTOMY    . TUBAL LIGATION  1979  . VIDEO BRONCHOSCOPY WITH ENDOBRONCHIAL ULTRASOUND N/A 12/15/2014   Procedure: VIDEO BRONCHOSCOPY WITH ENDOBRONCHIAL ULTRASOUND;  Surgeon: PIvin Poot MD;  [facility]  Service: Thoracic;  Laterality: N/A;    Family History: Family History  Problem Relation Age of Onset  . Heart disease Mother 425      Deceased from MI at 43yo . Hypertension Mother   . Heart disease Father 591      Deceased of MI age 62yo . Hypertension Father   . Hypertension Brother   . Lung cancer Unknown        Grandmother    Social History: Social History   Social History  . Marital status: Divorced    Spouse name: N/A  . Number of children: N/A  . Years of education: N/A   Social History Main Topics  . Smoking status: Former Smoker    Packs/day: 1.00    Years: 45.00    Types: Cigarettes    Start date: 04/04/1969    Quit date: 11/04/2015  . Smokeless tobacco: Never Used     Comment: 1 cig per day  . Alcohol use No  . Drug use: No  . Sexual activity: Not Currently    Birth control/ protection: None   Other Topics Concern  . None   Social History Narrative   Formerly worked as a cScientist, water quality now disabled.   Divorced.   2 grown children.   Lives with her grandson.    Allergies:  Allergies  Allergen Reactions  . Fluconazole Anaphylaxis and Itching  . Atorvastatin Other (See Comments)    Dizziness     Objective:    Vital  Signs:   Temp:  [98.9 F (37.2 C)-100.2 F (37.9 C)] 98.9 F (37.2 C) (08/22 0401) Pulse Rate:  [49-114] 101 (08/22 0829) Resp:  [16-29] 20  (08/22 0829) BP: (83-122)/(46-89) 92/61 (08/22 0829) SpO2:  [78 %-100 %] 92 % (08/22 0829) FiO2 (%):  [50 %] 50 % (08/22 0259) Weight:  [266 lb (120.7 kg)-266 lb 1.5 oz (120.7 kg)] 266 lb 1.5 oz (120.7 kg) (08/22 0401) Last BM Date: 11/13/2016  Weight change: Filed Weights   11/17/2016 1650 11/24/2016 2334 11/23/16 0401  Weight: 266 lb (120.7 kg) 266 lb 1.5 oz (120.7 kg) 266 lb 1.5 oz (120.7 kg)    Intake/Output:   Intake/Output Summary (Last 24 hours) at 11/23/16 1607 Last data filed at 11/23/16 0700  Gross per 24 hour  Intake           110.17 ml  Output              100 ml  Net            10.17 ml      Physical Exam    General:  Ill appearing female, lying in bed. NAD.  HEENT: normal Neck: supple. JVP hard to assess with body habitus but does not appear elevated. Carotids 2+ bilat; no bruits. No lymphadenopathy or thyromegaly appreciated. Cor: PMI nondisplaced. Regular rate & rhythm. No rubs, gallops or murmurs. Lungs: Diminished in all lobes. Scattered expiratory wheezes.  Abdomen: Obese, soft, nontender, nondistended. No hepatosplenomegaly. No bruits or masses. Good bowel sounds. Extremities: no cyanosis, clubbing, rash, edema Neuro: alert & orientedx3, cranial nerves grossly intact. moves all 4 extremities w/o difficulty. Affect pleasant   Telemetry   NSR - personally reviewed.   EKG    Sinus tach, RBBB - personally reviewed  Labs   Basic Metabolic Panel:  Recent Labs Lab 11/17/16 0236 11/18/16 0247 11/11/2016 1746 11/23/16 0342  NA 142 142 142 142  K 4.2 4.4 4.7 4.5  CL 98* 98* 100* 101  CO2 38* 38* 33* 31  GLUCOSE 105* 172* 188* 178*  BUN 21* 22* 18 25*  CREATININE 2.56* 2.40* 3.31* 3.85*  CALCIUM 8.4* 8.5* 8.2* 7.9*    Liver Function Tests:  Recent Labs Lab 11/05/2016 1746 11/23/16 0342  AST 831* 1,882*  ALT 364* 832*  ALKPHOS 87 82  BILITOT 1.2 0.9  PROT 6.4* 6.0*  ALBUMIN 2.8* 2.7*   No results for input(s): LIPASE, AMYLASE in the last 168  hours. No results for input(s): AMMONIA in the last 168 hours.  CBC:  Recent Labs Lab 11/17/16 0236 11/18/16 0247 11/19/2016 1746 11/23/16 0342  WBC 10.2 10.4 14.1* 16.7*  NEUTROABS  --   --  12.0*  --   HGB 8.4* 8.4* 9.5* 8.8*  HCT 28.6* 28.9* 33.0* 30.6*  MCV 96.9 95.4 97.3 98.4  PLT 274 272 258 210    Cardiac Enzymes:  Recent Labs Lab 11/11/2016 2107 11/28/2016 2320 11/23/16 0342  TROPONINI 2.22* 2.49* 2.65*    BNP: BNP (last 3 results)  Recent Labs  09/20/16 1928 11/14/16 1502 11/13/2016 1746  BNP 112.0* 190.0* 654.8*    ProBNP (last 3 results) No results for input(s): PROBNP in the last 8760 hours.   CBG:  Recent Labs Lab 11/23/16 0744  GLUCAP 197*    Coagulation Studies:  Recent Labs  11/23/16 0342  LABPROT 16.5*  INR 1.32     Imaging   Dg Chest Port 1 View  Result Date: 12/02/2016 CLINICAL DATA:  Shortness of breath.  EXAM: PORTABLE CHEST 1 VIEW COMPARISON:  Radiographs of November 17, 2016. FINDINGS: Stable cardiomegaly with central pulmonary vascular congestion. Atherosclerosis of thoracic aorta is noted. No pneumothorax is noted. Mild bilateral perihilar and basilar pulmonary edema is noted. Mild left pleural effusion is noted with probable associated atelectasis. Bony thorax is unremarkable. IMPRESSION: Stable cardiomegaly with central pulmonary vascular congestion. Aortic atherosclerosis. Mild bilateral pulmonary edema is noted with mild left pleural effusion and associated atelectasis. Electronically Signed   By: Marijo Conception, M.D.   On: 12/02/2016 18:01      Medications:     Current Medications: . aspirin  81 mg Oral QHS  . buPROPion  150 mg Oral BID  . fluticasone furoate-vilanterol  1 puff Inhalation Daily  . furosemide  80 mg Intravenous BID  . gabapentin  600 mg Oral QHS  . ipratropium-albuterol  3 mL Nebulization TID  . pantoprazole  40 mg Oral Daily  . rosuvastatin  20 mg Oral QHS     Infusions: . ceFEPime (MAXIPIME)  IV 2 g (11/23/16 0826)  . heparin 1,150 Units/hr (11/23/16 1324)       Patient Profile   [name] is a 61 year old female with a past medical history of COPD, diastolic CHF, CKD stage IV, CAD s/p DES to LAD in 2010, DM and lung cancer.    Assessment/Plan   1. Acute on chronic respiratory failure - With history of COPD and lung CA (Stage IIA (T2b, N0, M0) non-small cell lung cancer, adenocarcinoma with negative EGFR mutation and negative ALK gene translocation, diagnosed in August 2016 and presented with left lower lobe lung mass).  - Followed by Dr. Julien Nordmann. Recent CT with progression of lung nodules. Needs follow up.  - Continue Bipap prn, high flow o2. Consider oncology consult - Also with recurrent pleural effusion on L on X ray. Dr. Haroldine Laws reviewed, would get chest CT to evaluate. Her PleurX catheter was removed last week.   2. Chronic diastolic CHF - Does not appear volume overloaded on exam. Per Dr. Haroldine Laws, stop IV Lasix.  - Consider palliative care consult, she is followed by Palliative in the community.   3. History of CAD: DES to LAD in 2010.  - EKG non ischemic. Denies chest pain.  - Continue ASA.   4. Pleural effusion on L - Consider CT as discussed above.   5. CKD stage IV - baseline creatinine 2.4-2.6.  - Stop IV diuresis as above.  - Continue daily BMET. Consider renal consultation.     Length of Stay: [place], NP  11/23/2016, 8:37 AM  Advanced Heart Failure Team Pager [phone] (M-F; 7a - 4p)  Please contact [facility] for night-coverage after hours (4p -7a ) and weekends on [email or web address]  Patient seen and examined with Jettie Booze, NP. We discussed all aspects of the encounter. I agree with the assessment and plan as stated above.   She has acute on chronic respiratory failure in setting of severe COPD, lung CA and recently removed pleurex tube. Volume status hard to assess on exam due to body habitus but doesn't look markedly volume  overloaded. Weight stable from previous. Creatinine continues to rise with diuresis.   I do not think this is HF. I reviewed CXR personally and L effusion seems to be enlarging. Would consider f/u CT to re-evaluate. Would hold diuresis for now.   Palliative Care also involved.   We will follow at a distance. Please call with questions.  Glori Bickers, MD  9:20 AM

## 2016-11-23 NOTE — Plan of Care (Signed)
Problem: Bowel/Gastric: Goal: Will not experience complications related to bowel motility Outcome: Progressing Pt remains on 6L [place] or 50% fio2 on bipap.  Pt desats very quickly with any activity.  VSS otherwise.

## 2016-11-23 NOTE — Progress Notes (Addendum)
ANTICOAGULATION CONSULT NOTE - F/u Consult  Pharmacy Consult for Heparin Indication: chest pain/ACS  Allergies  Allergen Reactions  . Fluconazole Anaphylaxis and Itching  . Atorvastatin Other (See Comments)    Dizziness     Patient Measurements: Height: _0  (165.1 cm) Weight: 266 lb 1.5 oz (120.7 kg) IBW/kg (Calculated) : 57 Heparin Dosing Weight: 86 kg  Vital Signs: Temp: 98.9 F (37.2 C) (08/22 0401) Temp Source: Axillary (08/22 0401) BP: 114/86 (08/22 0700) Pulse Rate: 103 (08/22 0700)  Labs:  Recent Labs  11/18/2016 1746 11/21/2016 2107 11/21/2016 2320 11/23/16 0342 11/23/16 0613  HGB 9.5*  --   --  8.8*  --   HCT 33.0*  --   --  30.6*  --   PLT 258  --   --  210  --   APTT  --   --   --  56*  --   LABPROT  --   --   --  16.5*  --   INR  --   --   --  1.32  --   HEPARINUNFRC  --   --   --   --  0.26*  CREATININE 3.31*  --   --  3.85*  --   TROPONINI  --  2.22* 2.49* 2.65*  --     Estimated Creatinine Clearance: 19.7 mL/min (A) (by C-G formula based on SCr of 3.85 mg/dL (H)).   Medical History: Past Medical History:  Diagnosis Date  . Arthritis    "some scattered" (03/03/2015)  . CHF (congestive heart failure) ([place])   . Chronic bronchitis ([place])    "get it most years" (03/03/2015)  . COPD (chronic obstructive pulmonary disease) (H[facility])    Severe. Gold Stage IV.  PFTs (12/2008) - severe obstructive airway disease. Active tobacco use. Requires 4L O2 at home.  . Coronary artery disease    S/P PCI of LAD with DES (12/2008). Total occlusion of RCA noted at that time., medically managed. ACS ruled out 03/2009 with Lexiscan myoview . Followed by [place].  . Depression   . Diabetes mellitus without complication ([place])   . Diastolic dysfunction    2-D Echo (12/2008) - Normal LV Systolic funciton with EF 60-65%. Grade 1 diastolid dysfunction. No regional wall motion abnormalities. Moderate pulmonary HTN with PA peak pressure 53mHg.  . Full dentures   . GERD  (gastroesophageal reflux disease)    S/P Nissen fundoplication.  .Marland KitchenHistory of hiatal hernia   . Hx MRSA infection    Recurrent MRSA thigh abscesses.  . Hyperlipidemia   . Lung cancer ([place]    "left"  . Obesity   . On home oxygen therapy since 2010   4L all the time  . Pneumonia   . Prediabetes    HgbA1c 6.4 (12/2008)  . Pulmonary hypertension ([place]    2-D Echo (053/6468 - Systolic pressure was moderately increased. PA peak pressure  527mg. 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 Followed by Dr. WrJoya GaskinsLVelora Heckler . Pulmonary nodule, right    Small right middle lobe nodule. Stable as of 12/2008.  . Marland Kitchenhortness of breath dyspnea    with a lot of exertion; if fluid builds up    Medications:  Prescriptions Prior to Admission  Medication Sig Dispense Refill Last Dose  . acetaminophen-codeine (TYLENOL #3) 300-30 MG tablet Take 1 tablet by mouth every 6 (six) hours as needed for  severe pain. 20 tablet 0 prn  . albuterol (VENTOLIN HFA) 108 (90 Base) MCG/ACT inhaler Inhale 1-2 puffs into the lungs every 6 (six) hours as needed for shortness of breath. 18 g 11 prn  . aspirin 81 MG chewable tablet Chew 1 tablet (81 mg total) by mouth at bedtime. 90 tablet 3 11/21/2016 at Unknown time  . buPROPion (WELLBUTRIN SR) 150 MG 12 hr tablet Take 1 tablet (150 mg total) by mouth 2 (two) times daily. 180 tablet 0 11/30/2016 at Unknown time  . fluticasone furoate-vilanterol (BREO ELLIPTA) 100-25 MCG/INH AEPB Inhale 1 puff into the lungs daily. 1 each 0 11/29/2016 at Unknown time  . Fluticasone-Umeclidin-Vilant (TRELEGY ELLIPTA) 100-62.5-25 MCG/INH AEPB Inhale 1 puff into the lungs daily. 60 each 5 11/20/2016 at Unknown time  . furosemide (LASIX) 40 MG tablet Take 1 tablet (40 mg total) by mouth daily. 30 tablet 0 11/05/2016 at Unknown time  . gabapentin (NEURONTIN) 300 MG capsule Take 2 capsules (600 mg  total) by mouth at bedtime. 180 capsule 3 11/21/2016 at Unknown time  . ipratropium-albuterol (DUONEB) 0.5-2.5 (3) MG/3ML SOLN Take 3 mLs by nebulization 2 (two) times daily. 360 mL 1 11/13/2016 at Unknown time  . metolazone (ZAROXOLYN) 2.5 MG tablet TAKE 1 TABLET BY MOUTH AS NEEDED(FOR 5 LBS WEIGHT GAIN) (Patient taking differently: Take 2.5 mg by mouth once a day as needed for a weight gain of 5 pounds) 90 tablet 3 prn  . pantoprazole (PROTONIX) 40 MG tablet Take 1 tablet (40 mg total) by mouth daily. 90 tablet 3 11/03/2016 at Unknown time  . polyethylene glycol (MIRALAX / GLYCOLAX) packet Take 17 g by mouth daily as needed for moderate constipation or severe constipation. 14 each 0 prn  . predniSONE (DELTASONE) 20 MG tablet Take 2 tablets (40 mg total) by mouth daily with breakfast. 2 tablet 0 11/29/2016 at Unknown time  . Pyridoxine HCl (VITAMIN B-6 PO) Take 1 tablet by mouth daily.   11/17/2016 at Unknown time  . ranitidine (ZANTAC) 300 MG tablet Take 1 tablet (300 mg total) by mouth at bedtime. 90 tablet 3 11/21/2016 at Unknown time  . rosuvastatin (CRESTOR) 20 MG tablet Take 1 tablet (20 mg total) by mouth daily. (Patient taking differently: Take 20 mg by mouth at bedtime. ) 90 tablet 3 11/21/2016 at Unknown time  . senna (SENOKOT) 8.6 MG TABS tablet Take 2 tablets (17.2 mg total) by mouth daily as needed for mild constipation or moderate constipation. 120 each 0 prn  . ipratropium-albuterol (DUONEB) 0.5-2.5 (3) MG/3ML SOLN USE 3 ML VIA NEBULIZER EVERY 6 HOURS AS NEEDED FOR SHORTNESS OF BREATH (Patient not taking: Reported on 11/19/2016) 3240 mL 3 Not Taking at Unknown time    Assessment: 45 YOF who presented to the ED with elevated troponin and CHF exacerbation. Pharmacy consulted to start IV heparin for ACS.   Heparin level subtherapeutic at 0.26 - no issues with heparin infusion per RN. CBC stable with no s/s bleeding noted.   Noted SCr worsening from 3.31 to 3.85. Will increase heparin gtt per  protocol, however, patient may require more frequent heparin monitoring due to potential risk for accumulation.   Goal of Therapy:  Heparin level 0.3-0.7 units/ml Monitor platelets by anticoagulation protocol: Yes   Plan:  Increase heparin gtt to 1150 units/hr Heparin level in 8 hours Daily heparin level and CBC Monitor for s/s bleeding Follow cardiology plan   Argie Ramming, PharmD Clinical Pharmacist 11/23/16 7:22 AM

## 2016-11-23 NOTE — Consult Note (Signed)
Consultation Note Date: 11/23/2016   Patient Name: [name]  DOB: 09-May-1955  MRN: [id number]  Age / Sex: 61 y.o., female  PCP: Oval Linsey, MD Referring Physician: Axel Filler, *  Reason for Consultation: Disposition, Establishing goals of care and Psychosocial/spiritual support  HPI/Patient Profile: 61 y.o. female  with past medical history of recurrent NSCLC (followed by Dr. Julien Nordmann, s/p radiation. Recent progression seen on 8/14 CT chest), dCHF with preserved EF, pulmonary HTN, CAD, COPD-Gold Stage IV on 4L O2 via [place], and CKD stage IV. She presented to the ED after being found down at home and severely hypoxic. She was admitted on 11/20/2016.  Acute hypoxemia attributed to interruption in her home O2; she required BiPAP on admission but has since been transitioned to [place]. Concern for PNA given fever, with empiric antibiotics started. At this point respiratory failure suspected to be multifactorial: disruption in home O2, COPD, recurrent malignant left pleural effusion (Pleurx recently removed), possible PNA, and progression of lung cancer. HF team does not feel this is heart failure related. Additionally, her kidney function has markedly declined and continues to worsen, likely secondary to ATN from hypotension. Palliative consulted to assist in clarifying goals of care.   Clinical Assessment and Goals of Care: I met with Mrs. Auth at her bedside, her son was present for our conversation. [name] had excellent insight into her health issues. She understood she has chronic issues that will not improve, and the goal is to manage her symptoms in an effort to prolong her life and improve her quality of life. She also understands that her lung cancer has progressed and she believes she will poorly tolerate any potential treatment. As she thinks about what the future may look like, she is very concerned she will have a "rough road" with a recurrent  cycle of home/hospital.   As we talked through her understanding of her health and her perceptions of the future I began to explore how she wants to spend her time. Spending time with family is the priority. She is hesitant to pursue treatments/interventions/procedures that will not markedly improve her symptoms and lend to improved quality of life. I shared the option of utilizing Hospice services to meet her goal. I shared their philosophy of care, services they can provide, and location of these services (either at home, or at the residential hospice house if needed). Importantly, I detailed the difference between a comfort focused approach to care versus an ongoing aggressive path. [name] was thoughtful on these differences and wanted some time to think on it. At this point she would like to continue aggressive care and see how things change. If things do not improve she would be open to considering Hospice support.   Primary Decision Maker PATIENT   SUMMARY OF RECOMMENDATIONS    Continue full scope supportive care. Plan to see how this plays out and have a follow-up discussion in a few days  Code Status/Advance Care Planning:  DNR  Additional Recommendations (Limitations, Scope, Preferences):  Full Scope Treatment  Psycho-social/Spiritual:   Desire for further Chaplaincy support:no  Additional Recommendations: Education on Hospice  Prognosis:   Unable to determine at present. This will likely be clarified in the next 1-2 days as we follow kidney function, respiratory status, and determine how pt would like to proceed in terms of ongoing aggressive care versus comfort care.   Discharge Planning: To Be Determined      Primary Diagnoses: Present on Admission: . Hypoxia  I have reviewed the medical record, interviewed the patient and family, and examined the patient. The following aspects are pertinent.  Past Medical History:  Diagnosis Date  . Arthritis    "some  scattered" (03/03/2015)  . CHF (congestive heart failure) ([place])   . Chronic bronchitis ([place])    "get it most years" (03/03/2015)  . COPD (chronic obstructive pulmonary disease) (HCC)    Severe. Gold Stage IV.  PFTs (12/2008) - severe obstructive airway disease. Active tobacco use. Requires 4L O2 at home.  . Coronary artery disease    S/P PCI of LAD with DES (12/2008). Total occlusion of RCA noted at that time., medically managed. ACS ruled out 03/2009 with Lexiscan myoview . Followed by [place].  . Depression   . Diabetes mellitus without complication ([place])   . Diastolic dysfunction    2-D Echo (12/2008) - Normal LV Systolic funciton with EF 60-65%. Grade 1 diastolid dysfunction. No regional wall motion abnormalities. Moderate pulmonary HTN with PA peak pressure 64mHg.  . Full dentures   . GERD (gastroesophageal reflux disease)    S/P Nissen fundoplication.  .Marland KitchenHistory of hiatal hernia   . Hx MRSA infection    Recurrent MRSA thigh abscesses.  . Hyperlipidemia   . Lung cancer ([place]    "left"  . Obesity   . On home oxygen therapy since 2010   4L all the time  . Pneumonia   . Prediabetes    HgbA1c 6.4 (12/2008)  . [place]monary [place]    2-D Echo (054/0981 - Systolic pressure was moderately increased. PA peak pressure  576mg. 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 Followed by Dr. WrJoya GaskinsLVelora Heckler . Pulmonary nodule, right    Small right middle lobe nodule. Stable as of 12/2008.  . Marland Kitchenhortness of breath dyspnea    with a lot of exertion; if fluid builds up   Social History   Social History  . Marital status: Divorced    Spouse name: N/A  . Number of children: N/A  . Years of education: N/A   Social History Main Topics  . Smoking status: Former Smoker    Packs/day: 1.00    Years: 45.00    Types: Cigarettes    Start date: 04/04/1969    Quit date: 11/04/2015  .  Smokeless tobacco: Never Used     Comment: 1 cig per day  . Alcohol use No  . Drug use: No  . Sexual activity: Not Currently    Birth control/ protection: None   Other Topics Concern  . None   Social History Narrative   Formerly worked as a caScientist, water qualitynow disabled.   Divorced.   2 grown children.   Lives with her grandson.   Family History  Problem Relation Age of Onset  . Heart disease Mother 4769     Deceased from MI at 62yo. Hypertension Mother   . Heart disease Father 5477     Deceased of MI age 686yo. Hypertension Father   . Hypertension Brother   . Lung cancer Unknown        Grandmother   Scheduled Meds: . aspirin  81 mg Oral QHS  . buPROPion  150 mg Oral BID  . fluticasone furoate-vilanterol  1 puff Inhalation Daily  . gabapentin  600 mg Oral QHS  . ipratropium-albuterol  3 mL Nebulization TID  .  pantoprazole  40 mg Oral Daily   Continuous Infusions: . [START ON 11/24/2016] ceFEPime (MAXIPIME) IV     PRN Meds:.albuterol, polyethylene glycol, senna Allergies  Allergen Reactions  . Fluconazole Anaphylaxis and Itching  . Atorvastatin Other (See Comments)    Dizziness    Review of Systems  Constitutional: Positive for activity change, fatigue and fever. Negative for appetite change.  HENT: Negative for congestion, facial swelling, hearing loss, sinus pressure, sore throat and trouble swallowing.   Eyes: Negative for visual disturbance.  Respiratory: Positive for cough and shortness of breath. Negative for chest tightness.   Cardiovascular: Negative for chest pain.  Gastrointestinal: Negative for abdominal distention, abdominal pain, nausea and vomiting.  Genitourinary: Negative for difficulty urinating and flank pain.  Musculoskeletal: Positive for gait problem. Negative for back pain.  Skin: Positive for pallor.  Neurological: Positive for weakness. Negative for dizziness and speech difficulty.  Psychiatric/Behavioral: Negative for confusion, decreased  concentration and sleep disturbance. The patient is not nervous/anxious.    Physical Exam  Constitutional: She is oriented to person, place, and time. She appears well-developed and well-nourished.  HENT:  Head: Normocephalic and atraumatic.  Mouth/Throat: Oropharynx is clear and moist. No oropharyngeal exudate.  Eyes: EOM are normal.  Neck: Normal range of motion. Neck supple.  Cardiovascular: Regular rhythm.  Tachycardia present.   Pulmonary/Chest: Accessory muscle usage present. Tachypnea noted. She has decreased breath sounds in the right lower field.  Bilateral crackles at bases. Able to converse, though with moderate SOB and need for respiratory breaks  Abdominal: Soft. Bowel sounds are normal. She exhibits no distension.  Musculoskeletal: Normal range of motion.  Significant tremor  Neurological: She is alert and oriented to person, place, and time.  Skin: Skin is warm and dry. There is pallor.  Bruising at prior PleurX site  Psychiatric: She has a normal mood and affect. Her behavior is normal. Judgment and thought content normal.   Vital Signs: BP 107/71   Pulse (!) 105   Temp (!) 97.4 F (36.3 C) (Oral)   Resp (!) 23   Ht 5' 5" (1.651 m)   Wt 120.7 kg (266 lb 1.5 oz)   SpO2 (!) 85%   BMI 44.28 kg/m  Pain Assessment: No/denies pain POSS *See Group Information*: S-Acceptable,Sleep, easy to arouse Pain Score: Asleep   SpO2: SpO2: (!) 85 % O2 Device:SpO2: (!) 85 % O2 Flow Rate: .O2 Flow Rate (L/min): 6 L/min  IO: Intake/output summary:  Intake/Output Summary (Last 24 hours) at 11/23/16 1541 Last data filed at 11/23/16 1400  Gross per 24 hour  Intake           479.85 ml  Output              300 ml  Net           179.85 ml    LBM: Last BM Date: 12/01/2016 Baseline Weight: Weight: 120.7 kg (266 lb) Most recent weight: Weight: 120.7 kg (266 lb 1.5 oz)     Palliative Assessment/Data: PPS 50%    Time Total: 70 minutes Greater than 50%  of this time was spent  counseling and coordinating care related to the above assessment and plan.  Signed by: Charlynn Court, NP Palliative Medicine Team Pager # [phone] (M-F 7a-5p) Team Phone # [phone] (Nights/Weekends)

## 2016-11-23 NOTE — Plan of Care (Signed)
Problem: Education: Goal: Knowledge of [facility] Education information/materials will improve Outcome: Progressing Discussed with patient admission questions. Oriented to floor with some teach back.

## 2016-11-24 ENCOUNTER — Ambulatory Visit: Payer: Medicare HMO | Admitting: Internal Medicine

## 2016-11-24 ENCOUNTER — Other Ambulatory Visit: Payer: Medicare HMO

## 2016-11-24 ENCOUNTER — Inpatient Hospital Stay (HOSPITAL_COMMUNITY): Payer: Medicare HMO

## 2016-11-24 DIAGNOSIS — Z7189 Other specified counseling: Secondary | ICD-10-CM

## 2016-11-24 DIAGNOSIS — Z515 Encounter for palliative care: Secondary | ICD-10-CM

## 2016-11-24 DIAGNOSIS — R0902 Hypoxemia: Secondary | ICD-10-CM

## 2016-11-24 DIAGNOSIS — J449 Chronic obstructive pulmonary disease, unspecified: Secondary | ICD-10-CM

## 2016-11-24 DIAGNOSIS — C3492 Malignant neoplasm of unspecified part of left bronchus or lung: Secondary | ICD-10-CM

## 2016-11-24 LAB — COMPREHENSIVE METABOLIC PANEL
ALK PHOS: 85 U/L (ref 38–126)
ALT: 740 U/L — ABNORMAL HIGH (ref 14–54)
ANION GAP: 10 (ref 5–15)
AST: 605 U/L — ABNORMAL HIGH (ref 15–41)
Albumin: 2.6 g/dL — ABNORMAL LOW (ref 3.5–5.0)
BUN: 34 mg/dL — AB (ref 6–20)
CALCIUM: 7.4 mg/dL — AB (ref 8.9–10.3)
CO2: 29 mmol/L (ref 22–32)
CREATININE: 4.92 mg/dL — AB (ref 0.44–1.00)
Chloride: 98 mmol/L — ABNORMAL LOW (ref 101–111)
GFR, EST AFRICAN AMERICAN: 10 mL/min — AB (ref >60)
GFR, EST NON AFRICAN AMERICAN: 9 mL/min — AB (ref >60)
Glucose, Bld: 168 mg/dL — ABNORMAL HIGH (ref 65–99)
Potassium: 4 mmol/L (ref 3.5–5.1)
SODIUM: 137 mmol/L (ref 135–145)
Total Bilirubin: 0.8 mg/dL (ref 0.3–1.2)
Total Protein: 5.7 g/dL — ABNORMAL LOW (ref 6.5–8.1)

## 2016-11-24 LAB — CBC
HCT: 29.3 % — ABNORMAL LOW (ref 36.0–46.0)
HEMOGLOBIN: 8.4 g/dL — AB (ref 12.0–15.0)
MCH: 27.9 pg (ref 26.0–34.0)
MCHC: 28.7 g/dL — AB (ref 30.0–36.0)
MCV: 97.3 fL (ref 78.0–100.0)
PLATELETS: 182 10*3/uL (ref 150–400)
RBC: 3.01 MIL/uL — ABNORMAL LOW (ref 3.87–5.11)
RDW: 15.3 % (ref 11.5–15.5)
WBC: 16.3 10*3/uL — ABNORMAL HIGH (ref 4.0–10.5)

## 2016-11-24 LAB — CK: Total CK: 291 U/L — ABNORMAL HIGH (ref 38–234)

## 2016-11-24 NOTE — Progress Notes (Signed)
MD notified during rounds of pt having asymptomatic, non-sustained runs of SVT per telemetry (HR up to 130's). MD to follow up.

## 2016-11-24 NOTE — Progress Notes (Signed)
Internal Medicine Attending:   I saw and examined the patient. I reviewed the resident's note and I agree with the resident's findings and plan as documented in the resident's note.  61 year old woman on home hospice was admitted for acute on chronic hypoxic respiratory failure due to progressive severe obstructive pulmonary disease and recurrent locally metastatic lung cancer with a malignant recurrent left-sided pleural effusion. She has made some improvement over the last 24 hours. This morning she was on a high flow nasal cannula with supplemental oxygen at 12 L and saturating around 91%, though she did appear weak, was tachypneic, and tachycardic to 100. On exam she continues to have 1+ pitting edema bilaterally, she has decreased breath sounds on the left but fine crackles at the apex and on the right.  Acute on chronic hypoxic respiratory failure: unclear etiology of worsening hypoxia. We are treating empirically for pneumonia with cefepime, but I think we can stop antibiotics today if blood cultures are negative, she has no cough, no sputum production. The left sided pleural effusion may be enlarging and causing worsening symptoms. She has mild pulmonary edema on exam, but diuresis would be high risk give acute renal failure. I would plan to keep her euvolemic, and try to wean oxygen requirements back to around 6 lpm, which we would need if she is to ever return to home hospice.   Acute Renal Failure: Most likely ATN due to hypotension and prolonged hypoxic event. She appears euvolemic today, BP has been ok and I don't think she is hypovolemic today. I agree with holding on IV fluids given HFpEF, pulm HTN, and tenuous respiratory status. Please ensure normal post-void residuals today, ok to use foley if she has PVRs greater than 300 ml. Patient is not a candidate for HD given overall poor prognosis.   Goals of Care: Very limited prognosis given severe pulmonary disease, recurrent lung cancer, and  now with acute renal failure that is progressing. Palliative care working with patient, her goal is to return to home with hospice support. She will have to show improvement in oxygenation for that to be feasible. Otherwise residential hospice would be the alternative option.

## 2016-11-24 NOTE — Progress Notes (Signed)
Pt not currently on bipap, resting comfortably on a HFNC @ 10 L, will continue to monitor and place patient on bipap when needed.

## 2016-11-24 NOTE — Progress Notes (Signed)
MD notified of acute urinary retention; bladder scan 549m. Pt encouraged to attempt to void, however, states she has tried and is unable to. MD to place order for foley catheter. Will continue to monitor.

## 2016-11-24 NOTE — Progress Notes (Addendum)
Inpatient Diabetes Program Recommendations  AACE/ADA: New Consensus Statement on Inpatient Glycemic Control (2015)  Target Ranges:  Prepandial:   less than 140 mg/dL      Peak postprandial:   less than 180 mg/dL (1-2 hours)      Critically ill patients:  140 - 180 mg/dL   Lab Results  Component Value Date   GLUCAP 318 (H) 11/23/2016   HGBA1C 5.8 (H) 07/18/2016    Review of Glycemic Control  Diabetes history: DM 2 Outpatient Diabetes medications: none A1c 5.8% on 07/18/16 Current orders for Inpatient glycemic control: None  Inpatient Diabetes Program Recommendations:    Fingerstick yesterday 318 yesterday fasting glucose lab 168. Consider CBGs and possibly Novolog Sensitive Correction tid while inpatient.  Thanks,  Tama Headings RN, MSN, [facility] Inpatient Diabetes Coordinator Team Pager [phone] (8a-5p)

## 2016-11-24 NOTE — Progress Notes (Signed)
On call provider notified pt's sister at bedside requesting update on POC. MD to review chart and come speak to sister and pt. Will continue to monitor.

## 2016-11-24 NOTE — Progress Notes (Signed)
Subjective: Patient seen and examined. She states she is feeling okay. Her only complaint was that she is weak and unable to get up to use the bathroom on her own. She is frustrated with the amount of assistance she needs to use the bathroom, but understands it is for her safety. She is agreeable to continuing with supportive care measures.   Objective:  Vital signs in last 24 hours: Vitals:   11/24/16 0513 11/24/16 0600 11/24/16 0713 11/24/16 0842  BP: (!) 77/49 99/62  99/66  Pulse: 99 100  100  Resp: (!) _0 Temp:    98.6 F (37 C)  TempSrc:    Oral  SpO2: 92% 93% 90% 96%  Weight:      Height:       General: Laying in bed comfortably, NAD HEENT: [place]/AT, EOMI, no scleral icterus Cardiac: Tachycardic, regular rhythm, No R/M/G appreciated Pulm: +HFNC at 12 L, normal effort, +expiratory wheeze bilaterally  Abd: soft, non tender, non distended, BS normal Ext: extremities well perfused, trace peripheral edema Neuro: alert and oriented X3, cranial nerves II-XII grossly intact   Assessment/Plan:  Principal Problem:   Acute on chronic respiratory failure (HCC) Active Problems:   Coronary artery disease involving native heart without angina pectoris   Severe chronic obstructive pulmonary disease (HCC)   Adenocarcinoma of left lung, stage 4 (HCC)  Acute on chronic hypercarbic hypoxic respiratory failure: Multifactorial, but acute hypoxemia exacerbated by interruption in home oxygen for unknown length of time.  Afebrile, WBC 16.3, not improved from yesterday at 16.2. Receiving BiPAP 50% Fio2 at night and  HFNC  ~12 L during the day.  - Continue Cefepime1 mg Q24 Hr until 48 hour no growth on blood cultures  - Supplemental oxygen, titrate to maintain saturations between 88-92% - Albuterol Neb q4h PRN - Continue Duoneb BID - Continue Fluticasone furoate-vilanterol (BREO ELLIPTA) 1 Puff Daily   Acute on chronic renal failure  Most likely ATN secondary to hypoxemia. Cr 3.1  on admission, increased to 3.85>>4.92.  - Holding fluids for now, will continue to monitor Cr - Continuing to hold diuresis - Repeat CMP in AM  NSTEMI, Type II Troponin 1.18 on admission, continued to trend upward>>2.22, 2.49, 2.65>>2.10. Most likely due to profound hypoxemia, and possibly hypotension. Unlikely related to HF, not volume overloaded on exam. -Stopped Heparin infusion  - Continue ASA 65m Daily  - Holding rosuvastatin due to elevated LFTs  Transaminitis  Consistent with shock liver secondary to hypoxemia and hypotension. LFTs improving. AST 1882, ALT 832 on admission >> 605, 740 today  -CMP in AM -Continue to hold Crestor  Dispo: Anticipated discharge in approximately 2-3 days.    LMelanee Spry MD 11/24/2016, 10:19 AM Pager: [phone]

## 2016-11-25 DIAGNOSIS — J910 Malignant pleural effusion: Secondary | ICD-10-CM

## 2016-11-25 LAB — URINALYSIS, ROUTINE W REFLEX MICROSCOPIC
BILIRUBIN URINE: NEGATIVE
Glucose, UA: NEGATIVE mg/dL
KETONES UR: NEGATIVE mg/dL
NITRITE: NEGATIVE
PROTEIN: 100 mg/dL — AB
Specific Gravity, Urine: 1.018 (ref 1.005–1.030)
pH: 5 (ref 5.0–8.0)

## 2016-11-25 LAB — COMPREHENSIVE METABOLIC PANEL
ALT: 580 U/L — AB (ref 14–54)
AST: 157 U/L — AB (ref 15–41)
Albumin: 2.6 g/dL — ABNORMAL LOW (ref 3.5–5.0)
Alkaline Phosphatase: 83 U/L (ref 38–126)
Anion gap: 12 (ref 5–15)
BUN: 39 mg/dL — ABNORMAL HIGH (ref 6–20)
CHLORIDE: 95 mmol/L — AB (ref 101–111)
CO2: 31 mmol/L (ref 22–32)
CREATININE: 5.66 mg/dL — AB (ref 0.44–1.00)
Calcium: 8.4 mg/dL — ABNORMAL LOW (ref 8.9–10.3)
GFR calc non Af Amer: 7 mL/min — ABNORMAL LOW (ref >60)
GFR, EST AFRICAN AMERICAN: 8 mL/min — AB (ref >60)
Glucose, Bld: 202 mg/dL — ABNORMAL HIGH (ref 65–99)
Potassium: 4.8 mmol/L (ref 3.5–5.1)
SODIUM: 138 mmol/L (ref 135–145)
Total Bilirubin: 0.9 mg/dL (ref 0.3–1.2)
Total Protein: 6.1 g/dL — ABNORMAL LOW (ref 6.5–8.1)

## 2016-11-25 LAB — CBC
HCT: 30.9 % — ABNORMAL LOW (ref 36.0–46.0)
Hemoglobin: 8.7 g/dL — ABNORMAL LOW (ref 12.0–15.0)
MCH: 27.5 pg (ref 26.0–34.0)
MCHC: 28.2 g/dL — ABNORMAL LOW (ref 30.0–36.0)
MCV: 97.8 fL (ref 78.0–100.0)
PLATELETS: 182 10*3/uL (ref 150–400)
RBC: 3.16 MIL/uL — AB (ref 3.87–5.11)
RDW: 15.2 % (ref 11.5–15.5)
WBC: 17.8 10*3/uL — AB (ref 4.0–10.5)

## 2016-11-25 MED ORDER — LORAZEPAM 1 MG PO TABS
1.0000 mg | ORAL_TABLET | ORAL | Status: DC | PRN
Start: 2016-11-25 — End: 2016-11-25
  Administered 2016-11-25: 1 mg via SUBLINGUAL
  Filled 2016-11-25: qty 1

## 2016-11-25 MED ORDER — OXYCODONE HCL 5 MG/5ML PO SOLN
5.0000 mg | ORAL | Status: DC | PRN
  Administered 2016-11-25: 5 mg via ORAL
  Filled 2016-11-25: qty 5

## 2016-11-25 MED ORDER — GLYCOPYRROLATE 0.2 MG/ML IJ SOLN: 0.2000 mg | INTRAMUSCULAR | Status: DC | PRN

## 2016-11-25 MED ORDER — HALOPERIDOL LACTATE 5 MG/ML IJ SOLN: 0.5000 mg | INTRAMUSCULAR | Status: DC | PRN

## 2016-11-25 MED ORDER — LORAZEPAM 2 MG/ML PO CONC: 1.0000 mg | ORAL | Status: DC | PRN

## 2016-11-25 MED ORDER — TRAZODONE HCL 50 MG PO TABS: 25.0000 mg | ORAL_TABLET | Freq: Every evening | ORAL | Status: DC | PRN

## 2016-11-25 MED ORDER — HALOPERIDOL 0.5 MG PO TABS: 0.5000 mg | ORAL_TABLET | ORAL | Status: DC | PRN

## 2016-11-25 MED ORDER — HALOPERIDOL LACTATE 2 MG/ML PO CONC
0.5000 mg | ORAL | Status: DC | PRN
  Filled 2016-11-25: qty 0.3

## 2016-11-25 MED ORDER — ACETAMINOPHEN 650 MG RE SUPP: 650.0000 mg | Freq: Four times a day (QID) | RECTAL | Status: DC | PRN

## 2016-11-25 MED ORDER — LORAZEPAM 2 MG/ML IJ SOLN: 1.0000 mg | INTRAMUSCULAR | Status: DC | PRN

## 2016-11-25 MED ORDER — GLYCOPYRROLATE 1 MG PO TABS: 1.0000 mg | ORAL_TABLET | ORAL | Status: DC | PRN

## 2016-11-25 MED ORDER — ACETAMINOPHEN 325 MG PO TABS: 650.0000 mg | ORAL_TABLET | Freq: Four times a day (QID) | ORAL | Status: DC | PRN

## 2016-11-27 LAB — CULTURE, BLOOD (ROUTINE X 2)
Culture: NO GROWTH
Culture: NO GROWTH
Special Requests: ADEQUATE
Special Requests: ADEQUATE

## 2016-11-28 ENCOUNTER — Other Ambulatory Visit: Payer: Self-pay | Admitting: *Deleted

## 2016-11-28 ENCOUNTER — Other Ambulatory Visit: Payer: Self-pay | Admitting: Licensed Clinical Social Worker

## 2016-11-28 NOTE — Patient Outreach (Signed)
[facility]) Care Management  11/28/2016  [name] 1955/05/22 [id number]   Notified by hospital liaison that member expired prior to being discharged to residential hospice.  Will close case at this time.  Valente David, [place], MSN [place] [phone]

## 2016-11-28 NOTE — Consult Note (Signed)
Chart reviewed. Noted Ms. [place] expired. Will notify [place] team.  Marthenia Rolling, [place], RN,BSN [facility] Liaison [phone]

## 2016-11-28 NOTE — Patient Outreach (Signed)
[facility]) Care Management  11/28/2016  [name] [birth year] [id number]   Assessment- CSW will complete case closure at this time as [facility] Liaison was informed that patient expired while hospitalized.  Plan-CSW will update [facility] PCP and Care Management Assistant.  Eula Fried, BSW, MSW, [name] Phone: [phone] Fax: [phone]

## 2016-11-30 ENCOUNTER — Ambulatory Visit: Payer: Medicare HMO

## 2016-12-03 NOTE — Care Management Note (Addendum)
Case Management Note  Patient Details  Name: BAYLEA MILBURN MRN: [id number] Date of Birth: 07-03-1955  Subjective/Objective:   From home, presents with episode of hypoxia.  She was presently with [facility] for palliative care. She has non small cell lung cancer with malignant pl effusions, with worsening resp status,   Palliative consulted this admit and decision has been made for residential hospice.  CSW referral.                  Action/Plan: NCM will follow for dc needs.   Expected Discharge Date:  11/26/16               Expected Discharge Plan:  [place]  In-House Referral:  Clinical Social Work  Discharge planning Services  CM Consult  Post Acute Care Choice:    Choice offered to:     DME Arranged:    DME Agency:     HH Arranged:    [place] Agency:     Status of Service:  Completed, signed off  If discussed at [employer], dates discussed:    Additional Comments:  Zenon Mayo, RN Dec 10, 2016, 3:26 PM

## 2016-12-03 NOTE — Consult Note (Signed)
   [facility] CM Inpatient Consult   2016-12-08  [name] [birth year] [id number]    Spoke with inpatient RNCM who indicates the discharge plan is for residential hospice.  Will make [place] team aware.   Marthenia Rolling, MSN-Ed, RN,BSN [facility] Liaison [phone]

## 2016-12-03 NOTE — Clinical Social Work Note (Signed)
Clinical Social Worker met with patient sister outside of room to confirm wishes for residential hospice placement.  Patient sister confirmed that patient and family agreeable with residential hospice and after receiving choice, have chosen [facility].  Patient sister states that there are some social dynamics for patient family that will make it difficult for visitation if patient is moved out of [place].  CSW contacted [facility] for referral and hopeful for bed availability tomorrow.  CSW remains available for support and to facilitate patient discharge needs.  Barbette Or, [place]

## 2016-12-03 NOTE — Progress Notes (Signed)
Daily Progress Note   Patient Name: [name]       Date: 12/10/16 DOB: Nov 14, 1955  Age: 61 y.o. MRN#: [id number] Attending Physician: Axel Filler, * Primary Care Physician: Oval Linsey, MD Admit Date: 12/01/2016  Reason for Consultation/Follow-up: Establishing goals of care  Subjective: Met with patient and her sister at bedside. RN dc'd bipap to [place] so that patient could talk. Patient stated she realizes she is "at the end" meaning she is nearing end of life. She desires to be "kept as comfortable as possible without machines". I discussed possibility of residential hospice with her and she and her sister agree this would be best option for her.  Discussed with sister and patient the difference between continued  aggressive medical care and comfort care. Discussed Hospice philosophy and use of medications for comfort rather than continued use of Bipap and titrating O2 once she was discharged from hospital. She and sister verbalized understanding.    ROS  Length of Stay: 3  Current Medications: Scheduled Meds:  . aspirin  81 mg Oral QHS  . buPROPion  150 mg Oral BID  . fluticasone furoate-vilanterol  1 puff Inhalation Daily  . gabapentin  600 mg Oral QHS  . ipratropium-albuterol  3 mL Nebulization TID  . pantoprazole  40 mg Oral Daily    Continuous Infusions:   PRN Meds: albuterol, LORazepam, oxyCODONE, polyethylene glycol, senna  Physical Exam          Vital Signs: BP (!) 99/47   Pulse (!) 105   Temp 99.1 F (37.3 C) (Axillary)   Resp (!) 21   Ht 5' 5" (1.651 m)   Wt 121.4 kg (267 lb 10.2 oz)   SpO2 97%   BMI 44.54 kg/m  SpO2: SpO2: 97 % O2 Device: O2 Device: Nasal Cannula O2 Flow Rate: O2 Flow Rate (L/min): 10 L/min  Intake/output summary:    Intake/Output Summary (Last 24 hours) at Dec 10, 2016 1355 Last data filed at 2016-12-10 0600  Gross per 24 hour  Intake              480 ml  Output             1075 ml  Net             -595 ml   LBM: Last  BM Date: 11/14/2016 Baseline Weight: Weight: 120.7 kg (266 lb) Most recent weight: Weight: 121.4 kg (267 lb 10.2 oz)       Palliative Assessment/Data: PPS: 10%      Patient Active Problem List   Diagnosis Date Noted  . Goals of care, counseling/discussion   . Palliative care by specialist   . Morbid obesity ([place])   . Acute on chronic respiratory failure ([place]) 09/20/2016  . COPD exacerbation ([place]) 08/23/2016  . Pleural effusion   . Constipation due to slow transit   . History of adenocarcinoma of lung   . CKD (chronic kidney disease) stage 4, GFR 15-29 ml/min (HCC) 02/04/2016  . Benign paroxysmal positional vertigo 09/10/2015  . Subjective tinnitus 09/10/2015  . Chronic venous insufficiency   . Vitamin B12 deficiency neuropathy ([place]) 12/21/2014  . Adenocarcinoma of left lung, stage 4 ([place]) 12/04/2014  . Healthcare maintenance 02/06/2013  . Diastolic CHF with preserved left ventricular function, NYHA class 2 ([place]) 11/05/2012  . Other emphysema ([place])   . Hyperlipidemia   . Financial difficulties 06/15/2010  . Pulmonary hypertension ([place]) 01/05/2009  . Morbid obesity with BMI of 40.0-44.9, adult ([place]) 12/31/2008  . OSA and COPD overlap syndrome ([place]) 12/31/2008  . Shortness of breath 12/31/2008  . Tobacco abuse 12/22/2008  . 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 Severe chronic obstructive pulmonary disease ([place]) 12/22/2008  . Gastroesophageal reflux disease 12/22/2008  . Insulin dependent type 2 diabetes mellitus, controlled ([place]) 12/22/2008    Palliative Care Assessment & Plan   Patient Profile: 61 y.o. female  with past medical history of recurrent NSCLC (followed by Dr. Julien Nordmann, s/p radiation. Recent progression seen on  8/14 CT chest), dCHF with preserved EF, pulmonary HTN, CAD, COPD-Gold Stage IV on 4L O2 via [place], and CKD stage IV. She presented to the ED after being found down at home and severely hypoxic. She was admitted on 11/30/2016.  Acute hypoxemia attributed to interruption in her home O2; she required BiPAP on admission but has since been transitioned to [place]. Concern for PNA given fever, with empiric antibiotics started. At this point respiratory failure suspected to be multifactorial: disruption in home O2, COPD, recurrent malignant left pleural effusion (Pleurx recently removed), possible PNA, and progression of lung cancer. HF team does not feel this is heart failure related. Additionally, her kidney function has markedly declined and continues to worsen, likely secondary to ATN from hypotension. Palliative consulted to assist in clarifying goals of care.   Assessment/Recommendations/Plan   Clinical picture has not improved. After discussion with her sister, patient has opted for transition to Hospice services. She and family request residential Hospice.  Lorazepam 1 mg sublingual every 4 hours PRN shortness of breath or anxiety  Oxycodone solution 92m every 1 hour PRN for SOB or anxiety  If patient remains symptomatic despite maximum doses, please call PMT at [phone] between 0700 and 1900. Outside of these hours, please call attending, as PMT does not have night coverage.   Goals of Care and Additional Recommendations:  Limitations on Scope of Treatment: Full Comfort Care and Minimize Medications  Code Status:  DNR  Prognosis:   < 2 weeks AEB recurrent non-small cell lung cancer with malignant pleural effusions, now with worsening respiratory status, patient requesting transition to comfort measures and hospice care  Discharge Planning:  Hospice facility  Care plan was discussed with patient and her sister.  Thank you for allowing the Palliative Medicine Team to assist in the  care of  this patient.   Time In: 1300 Time Out: 1500 Total Time 120 mins Prolonged Time Billed Yes      Greater than 50%  of this time was spent counseling and coordinating care related to the above assessment and plan.  Mariana Kaufman, AGNP-C Palliative Medicine   Please contact Palliative Medicine Team phone at [phone] for questions and concerns.

## 2016-12-03 NOTE — Progress Notes (Signed)
[facility] Liaison:  RN   Received request from [place], [place], for family interest in [facility].  Chart being reviewed.  Spoke with sister, Lars Pinks, to acknowledge referral.  Unfortunately, [facility] is not able to offer a room today.  Family and CSW are aware.  [facility] liaison will follow up with CSW and family tomorrow or sooner if room becomes available.  [place] do not hesitate to call with questions.   Thank you for this referral.  Edyth Gunnels, RN, [facility] Liaison [phone]  All hospital liaisons are now on [place].

## 2016-12-03 NOTE — Progress Notes (Signed)
Internal Medicine Attending:   I saw and examined the patient. I reviewed the resident's note and I agree with the resident's findings and plan as documented in the resident's note.  61 year old woman admitted with severe acute on chronic hypoxic respiratory failure and acute on chronic renal failure. Unfortunately the patient continues to decline, her renal function is worsening despite supportive care. Her respiratory status remains tenuous, she requires BiPAP with 60% FiO2 to maintain saturations at night, and during the day she is requiring 10 lpm of supplemental oxygen via high flow nasal canula. On exam she continues to appear mildly hypervolemic with some crackles but no edema. We had to place a Foley catheter yesterday because of persistent urinary retention, and she is putting out about 50 mL of urine per hour. She is already on home hospice and has a very limited prognosis even before this decompensated illness. However we have not seen much progress this admission so far, and I am not hopeful that we will get her respiratory status good enough for her to be able to return to home; she also have very limited support at home. I have encouraged her family to visit with her over the next few days, as I think we will need to refer her to residential hospice early next week. Plan is to continue with supportive care for now, trend renal function, hold diuresis, watch blood pressure. She is not a dialysis candidate, DNR, and would not escalate therapies beyond current measures (i.e. No pressors for hypotension).

## 2016-12-03 NOTE — Plan of Care (Signed)
Problem: Education: Goal: Knowledge of [facility] Education information/materials will improve Outcome: Not Progressing Palliative and Spiritual care consult completed. Advanced directive completed. Patient to soon transfer to residential hospice.

## 2016-12-03 NOTE — Progress Notes (Signed)
   Family (Sister, daughter, son, grandson) at bedside.   Advanced Directive complete.  Copy added to chart.  Original w/ patient.  Will follow, as needed.

## 2016-12-03 NOTE — Progress Notes (Signed)
RT came by room to check on patient, sats were between 85-92%, placed patient back on bipap and increased FIO2 to 60%.

## 2016-12-03 NOTE — Progress Notes (Signed)
Pt. deceased w/ no Certificate of Death completed by the physician. Teaching services paged many times about completing, however no response. Given permission by pt. Placement to take the body to the morgue around 2100 on 8/24. Upon arrive back to the unit at 2120, teaching services returned my page and said it was not the night coverage's responsibility to fill out and sign the certificate. Was advised by teaching services that the attending will come in the morning and fill out the necessary paperwork.

## 2016-12-03 NOTE — Progress Notes (Signed)
   Subjective: Patient seen and examined. Today the patient is accompanied by her sister. The team updated Ms. Slevin family member about her current medical status and therapy she has received, and is continuing to receive.  Need for inpatient hospice versus residential hospice was discussed with Ms. Pullen sister. No other complaints at this time.   Objective:  Vital signs in last 24 hours: Vitals:   12/12/16 0816 12-12-16 0817 12-12-16 1040 12-12-16 1203  BP: (!) 93/47 (!) 99/47    Pulse: (!) 104   (!) 105  Resp: 19   (!) 21  Temp: 99.1 F (37.3 C)   99.1 F (37.3 C)  TempSrc: Axillary   Axillary  SpO2: 94%  97%   Weight:      Height:       General: Sleeping, NAD, BiPAP mask on HEENT: [place]/AT, EOMI, no scleral icterus Cardiac: Tachycardic, regular rhythm, No R/M/G appreciated Pulm: normal effort, decreased breath sounds L>R.  Abd: soft, non tender, non distended, BS normal Ext: extremities well perfused, trace peripheral edema Neuro: alert and oriented X3, cranial nerves II-XII grossly intact   Assessment/Plan:  Principal Problem:   Acute on chronic respiratory failure (HCC) Active Problems:   Coronary artery disease involving native heart without angina pectoris   Severe chronic obstructive pulmonary disease (HCC)   Adenocarcinoma of left lung, stage 4 (HCC)   Goals of care, counseling/discussion   Palliative care by specialist  Acute on chronic hypercarbic hypoxic respiratory failure: Multifactorial with acute hypoxemia exacerbated by interruption in home oxygen for unknown length of time.  Lack of improvement most likely correlates with worsening renal function. Pt is requiring increased oxygen requirement with BiPAP at 60% FiO2 or HFNC at 10-12 L.  Antibiotics were discontinued as blood cultures were negative for >48 hours and CXR was stable from previous admission. Afebrile, hypotensive 80s/60s, WBC 17.8.  - Supplemental oxygen, titrate to maintain saturations between  88-92%, BiPAP when needed - Albuterol Neb q4h PRN - Continue Duoneb BID - Continue Fluticasone furoate-vilanterol (BREO ELLIPTA) 1 Puff Daily - Respiratory therapy following  Acute on chronic renal failure  Most likely ATN secondary to hypoxemia and hypotension. Cr 3.1 on admission, increased to 3.85>>4.92>>5.66.  - Urine Sediment ordered  -Holding fluids for now, continuing to monitor Cr - Continuing to hold diuresis - Repeat CMP in AM  Transaminitis  Consistent with shock liver secondary to hypoxemia and hypotension. LFTs continuing to improve. AST 1882>>605>>157, ALT 832>>740>>580.  -CMP in AM -Continue to hold Crestor   Dispo: Anticipated discharge in approximately 2-3 days.    Melanee Spry, MD 2016/12/12, 12:16 PM Pager: [phone]

## 2016-12-03 NOTE — Care Management Important Message (Signed)
Important Message  Patient Details  Name: [name] MRN: [id number] Date of Birth: 03/29/1956   Medicare Important Message Given:  Yes    Nathen May December 05, 2016, 10:44 AM

## 2016-12-03 NOTE — Progress Notes (Signed)
RT went into patients room to see if she was ready to be placed back on bipap she is currently resting comfortably on a HFNC @ 10L, sats between 92-95%, bipap not needed at this time, will continue to monitor patient throughout the night.

## 2016-12-03 NOTE — Discharge Summary (Signed)
  Name: [name] MRN: [id number] DOB: Nov 25, 1955 61 y.o.  Date of Admission: 11/15/2016  4:40 PM Date of Discharge: 11/26/2016 Attending Physician: No att. providers found  Discharge Diagnosis:  Respiratory failure Principal Problem:   Acute on chronic respiratory failure ([place]) Active Problems:   Coronary artery disease involving native heart without angina pectoris   Severe chronic obstructive pulmonary disease (HCC)   Adenocarcinoma of left lung, stage 4 (HCC)   Goals of care, counseling/discussion   Palliative care by specialist   Pleural effusion, malignant   Cause of death: Respiratory failure Time of death: June 03, 1738  Disposition and follow-up:   [name] was discharged from [facility] in expired condition.    Hospital Course: [name] was admitted to [facility] and the Internal Medicine teaching service for hypoxic and hypercarbic respiratory failure, NSTEMI, acute renal failure, and transaminitis. In the emergency department, the patient had altered mental status and hypoxia. Her oxygen saturation was 50-60% on arrival, which improved to the 90s on 6 liters per minute. She was placed on BiPAP. Pertinent laboratory tests were ordered and showed showed WBC 14.2, Cr 3.1, AST 831, ALT 364, BNP 654.8, Troponin 1.18, amd Lactate 1.73. Blood gas showed hypoxemic hypercapnia consistent with the patient's baseline. EKG showed sinus tachycardia with PACs. Chest xray showed stable chronic left pleural effusion and mild bilateral pulmonary edema. Cardiology was consulted for her elevated troponin and BNP and she was given 345m of aspirin.   The day following admission the patient was started on empiric antibiotics (Cefepime) due to a consistently elevated WBC and a documented low grade fever. Serial troponin continued to trend upward and plateaued at 2.65. Repeat EKG showed new incomplete right bundle branch block, and the patient was diagnosed with an NSTEMI.  She was medically managed with a heparin drip and was not a candidate for PCI or invasive interventions due to multiple comordities. The patient's creatinine continued to rise despite holding diuretics. Liver enzymes reached a maximum of  AST 1882 and ALT 832 and appropriately trended downward. Empiric antibiotics were stopped after blood cultures were negative for growth after two days.  Ms. PSprowlwas managed with supportive treatments such as supplemental oxygen, BiPAP, Duonebs, and her home inhaler regimen. Palliative care was promptly consulted to discuss the patient's goals of care, which initially were supportive treatments and goals for residential hospice. Unfortunately, the patient continued to decline. Her renal function worsened despite supportive care.  Her respiratory status was tenuous. She initally required BiPAP with 60% FiO2 to maintain saturations at night and 10-12 liters of supplemental oxygen via high flow nasal canula during the day. Subsequently, she began requiring BiPAP with 60% FiO2 to maintain saturations at all times. Palliative care remained highly involved in her care. Following discussion with the patient and her family, it was decided to transition the patient to comfort measures, while initiating placement to inpatient hospice. The patient expired soon after comfort measure orders were placed.    Signed: LMelanee Spry MD 11/26/2016, 11:28 AM

## 2016-12-03 DEATH — deceased

## 2016-12-07 ENCOUNTER — Encounter: Payer: Medicare HMO | Admitting: Cardiothoracic Surgery

## 2016-12-23 ENCOUNTER — Encounter: Payer: Medicare HMO | Admitting: Internal Medicine

## 2016-12-26 IMAGING — CR DG CHEST 1V PORT
1 series · 1 of 1 positions shown · non-contrast
Comparison: PA and lateral chest 04/10/2014 and 03/18/2014.

CLINICAL DATA: Intermittent shortness of breath since 05/18/2014.

EXAM:
PORTABLE CHEST - 1 VIEW

[AP]
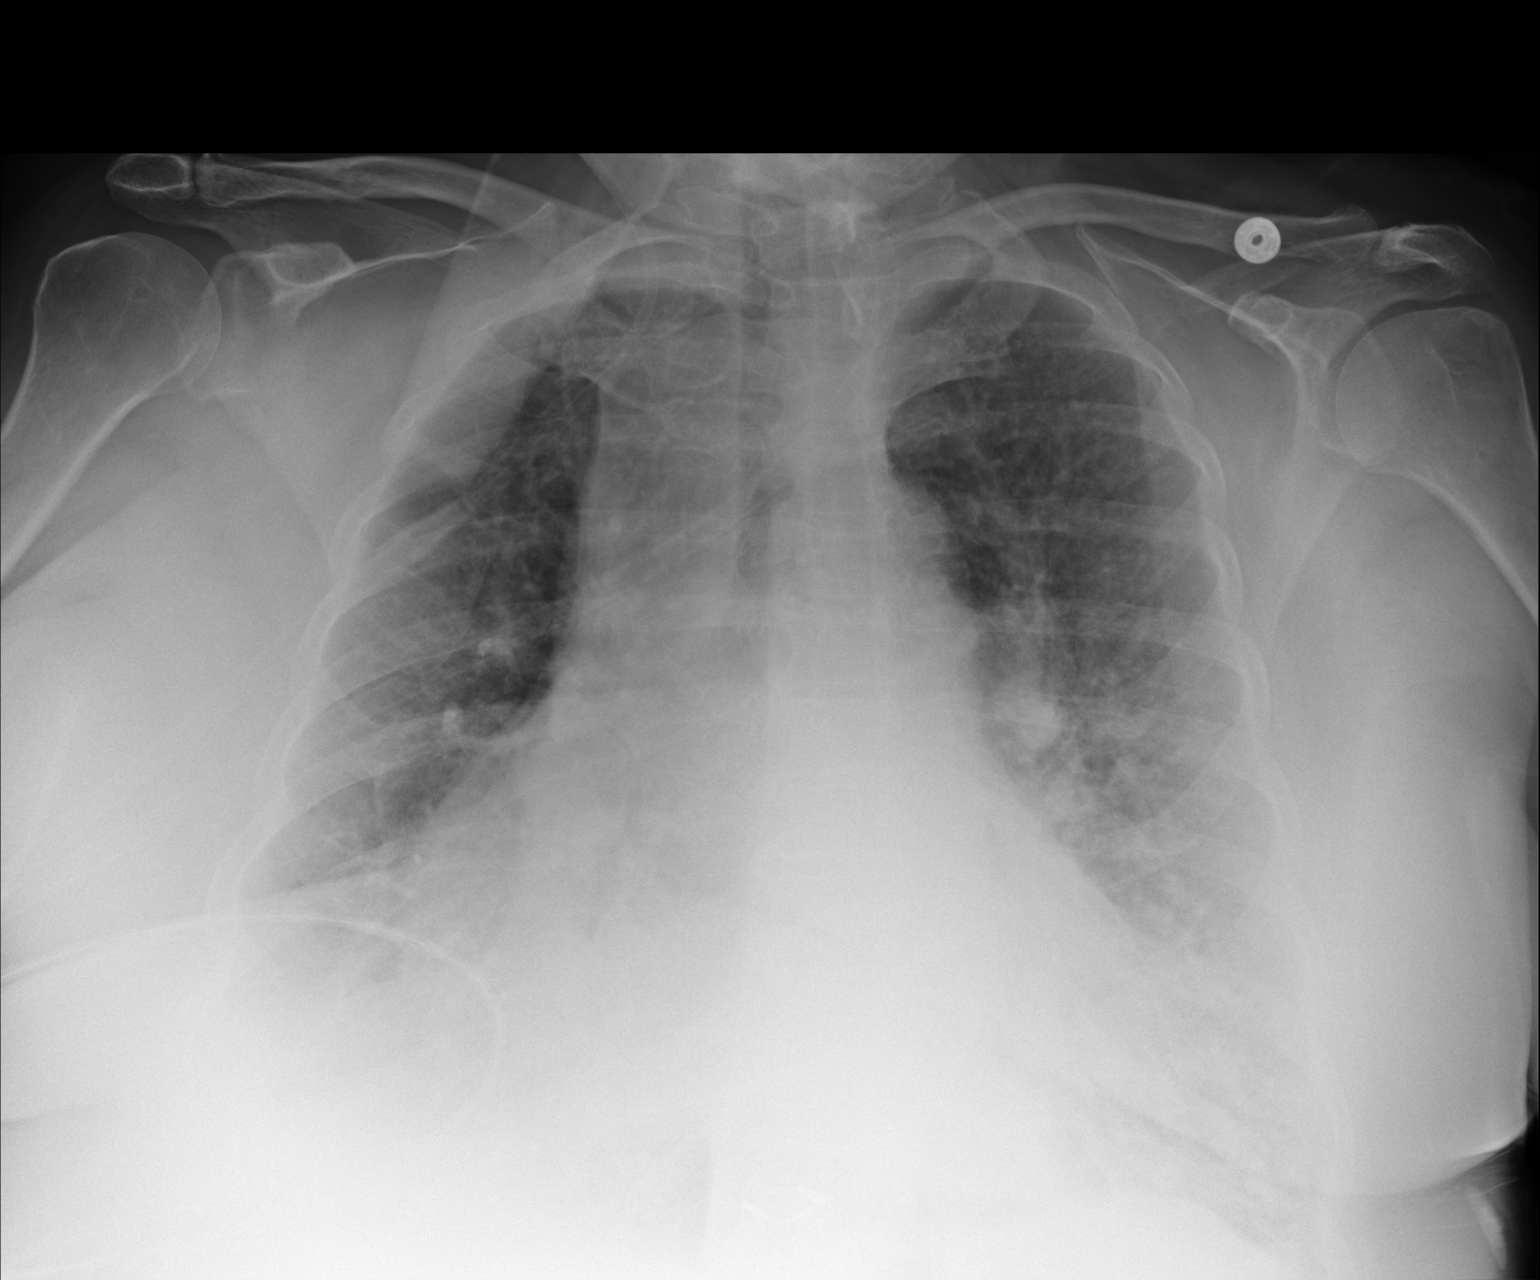

[1 of 1 positions shown; findings below may reference images not displayed]

FINDINGS: Cardiomegaly and vascular congestion appear unchanged. No
consolidative process, pneumothorax or effusion is identified. No
focal bony abnormality is noted.
IMPRESSION: No acute abnormality.  Cardiomegaly and chronic vascular congestion.

## 2017-02-02 IMAGING — CR DG CHEST 2V
2 series · 2 of 2 positions shown · non-contrast
Comparison: Chest x-ray 05/22/2014.

CLINICAL DATA: 59-year-old female with shortness of breath and
midsternal chest pain since yesterday.

EXAM:
CHEST  2 VIEW

[w chest pa]
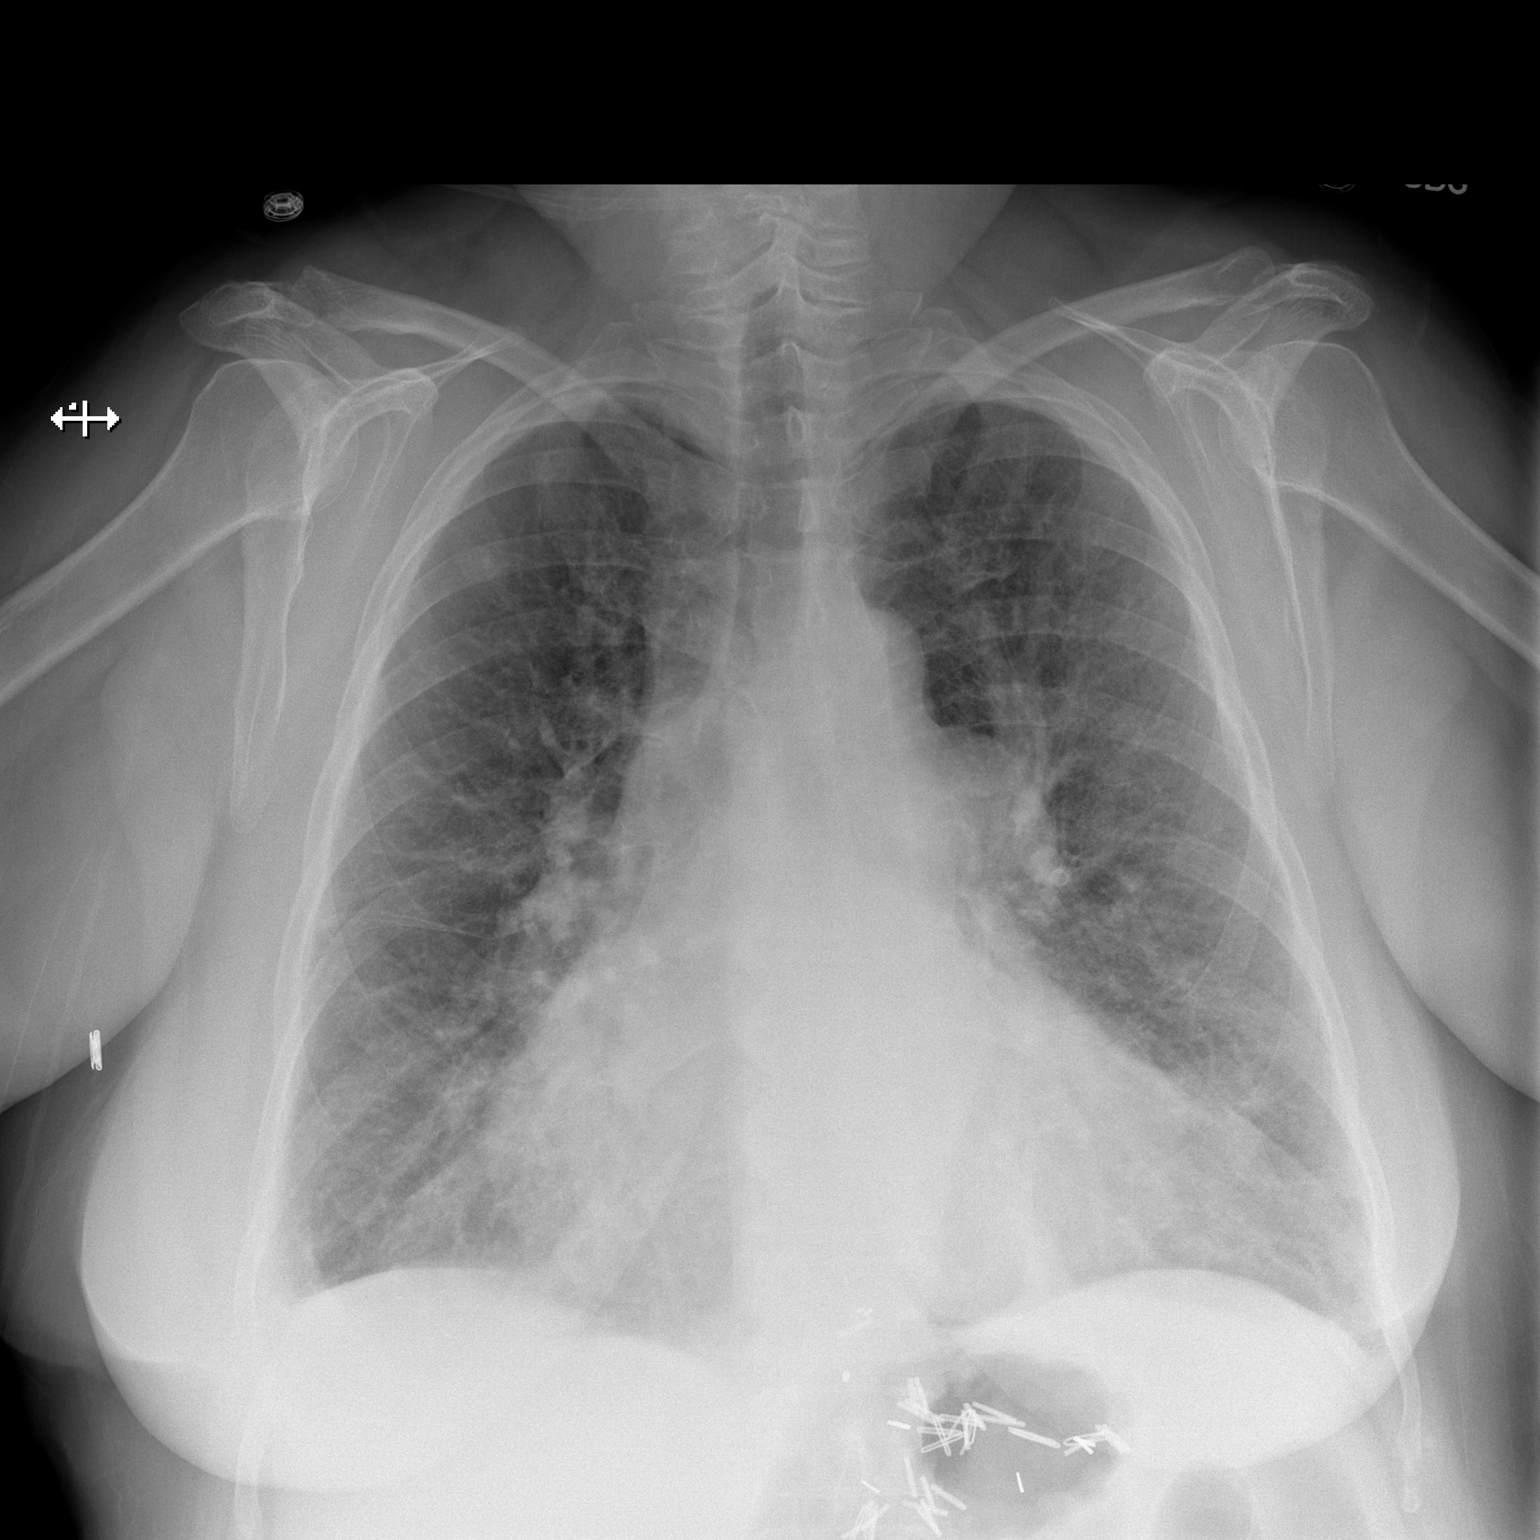

[w chest lat]
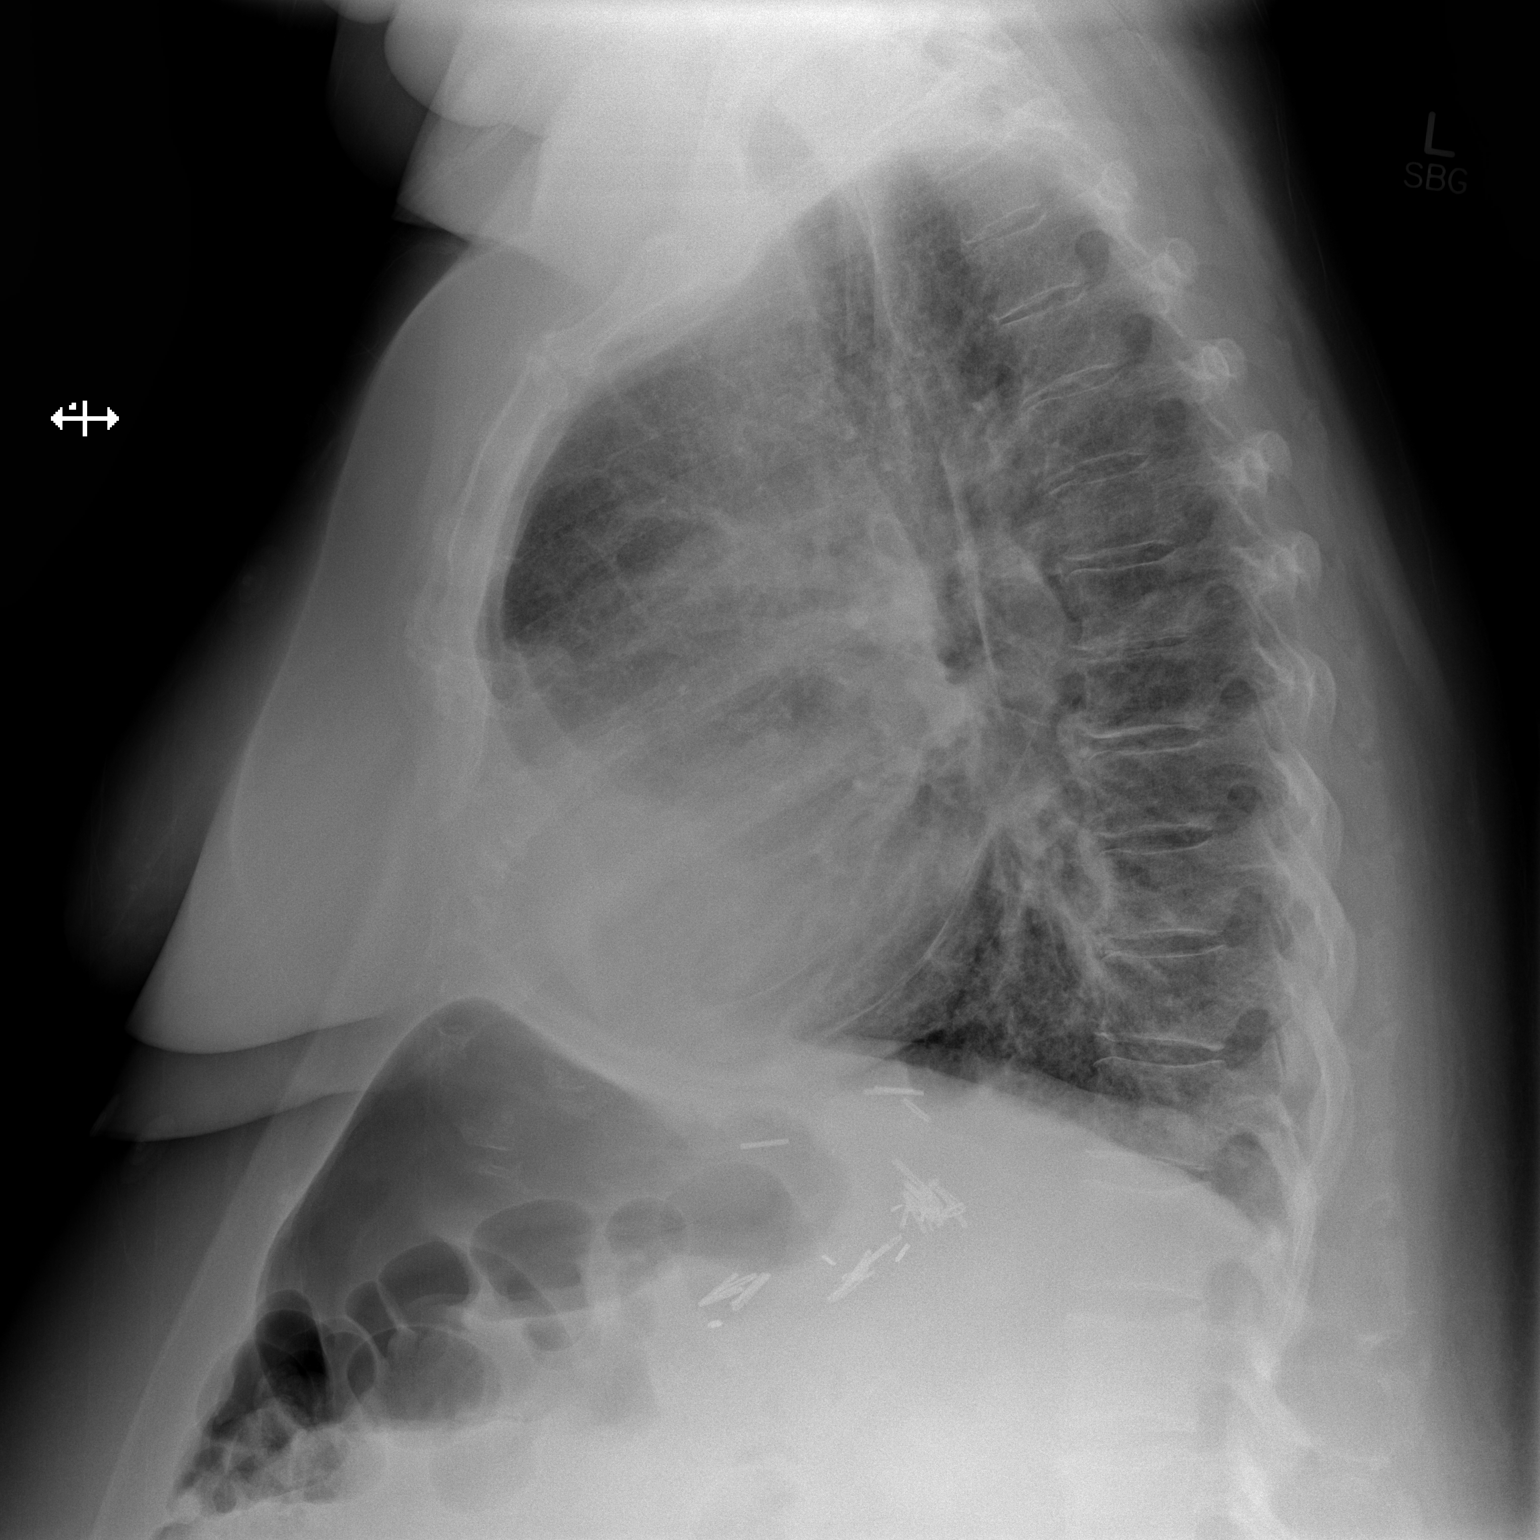

[2 of 2 positions shown; findings below may reference images not displayed]

FINDINGS: There is cephalization of the pulmonary vasculature and slight
indistinctness of the interstitial markings suggestive of mild
pulmonary edema. No pleural effusions. Mild cardiomegaly. Upper
mediastinal contours are within normal limits. Numerous surgical
clips near the gastroesophageal junction.
IMPRESSION: 1. The appearance of the chest suggests mild congestive heart
failure, as above.

## 2017-02-03 IMAGING — DX DG HIP (WITH OR WITHOUT PELVIS) 2-3V*L*
4 series · 4 of 4 positions shown · non-contrast
Comparison: None.

CLINICAL DATA: Patient status post fall 6-9 months ago. Left hip
pain and bruising in the gluteal area. No numbness or tingling.

EXAM:
LEFT HIP (WITH PELVIS) 2-3 VIEWS

[pelvis ap (1 of 2)]
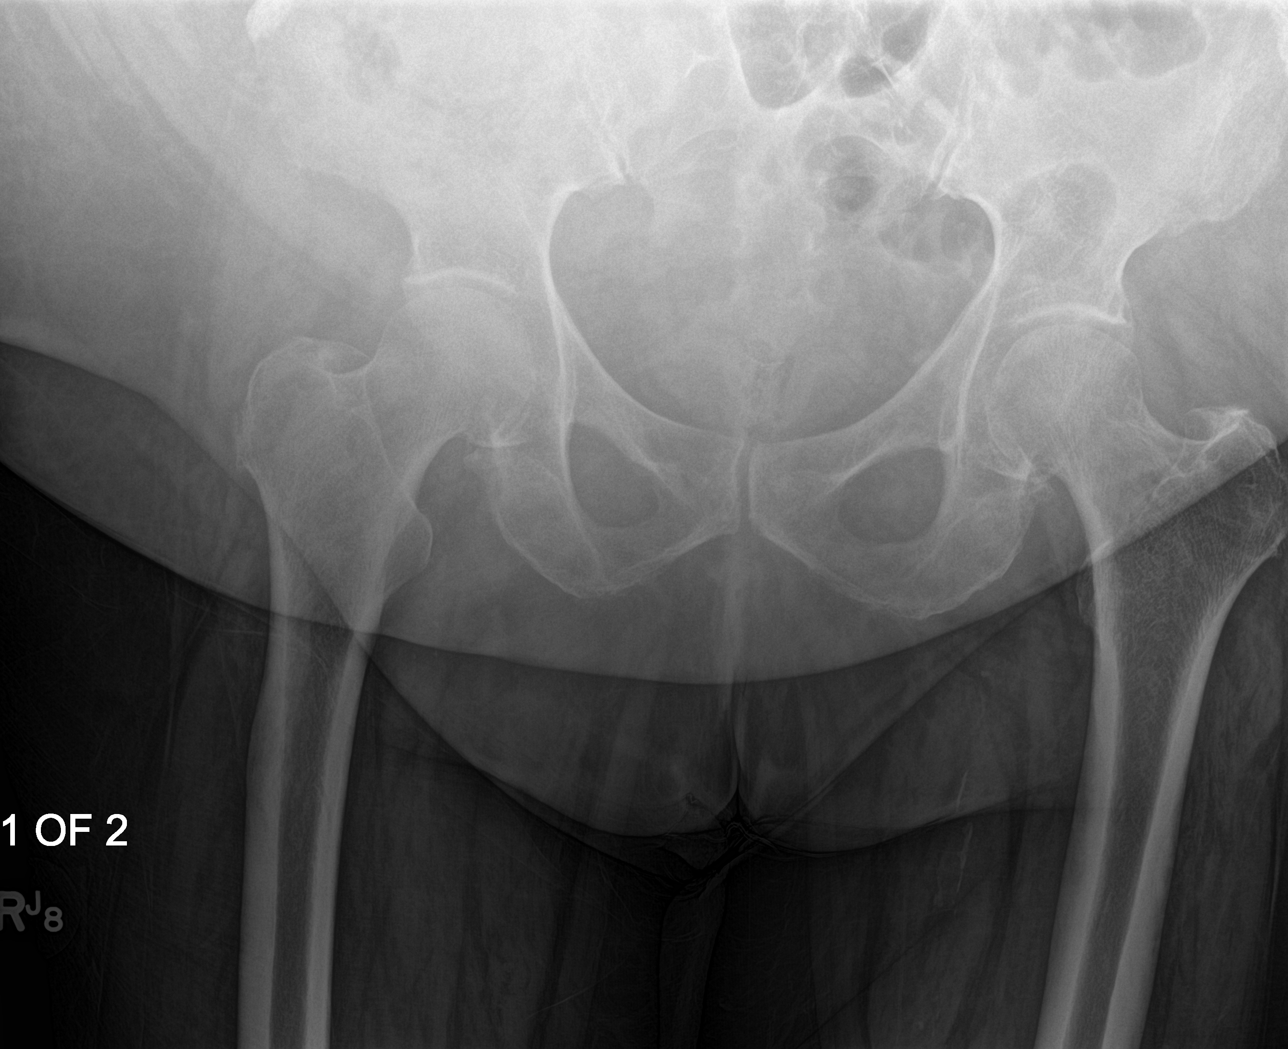

[hip ap]
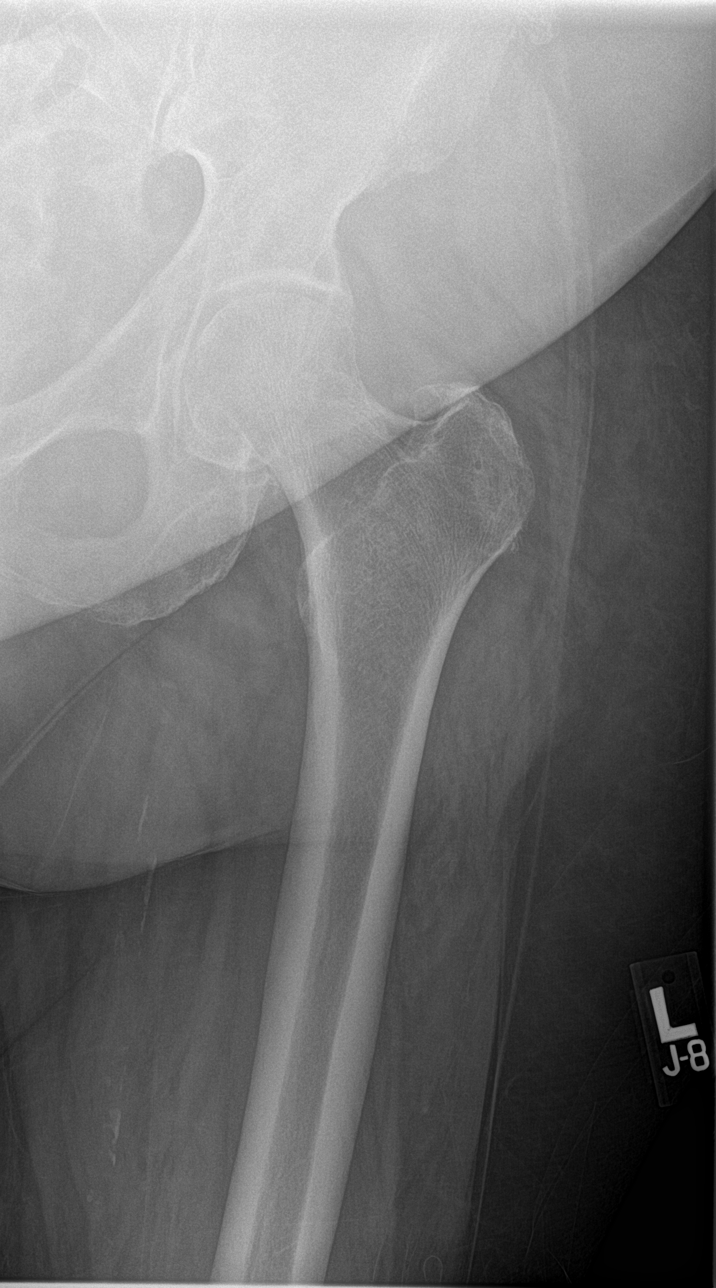

[hip lat]
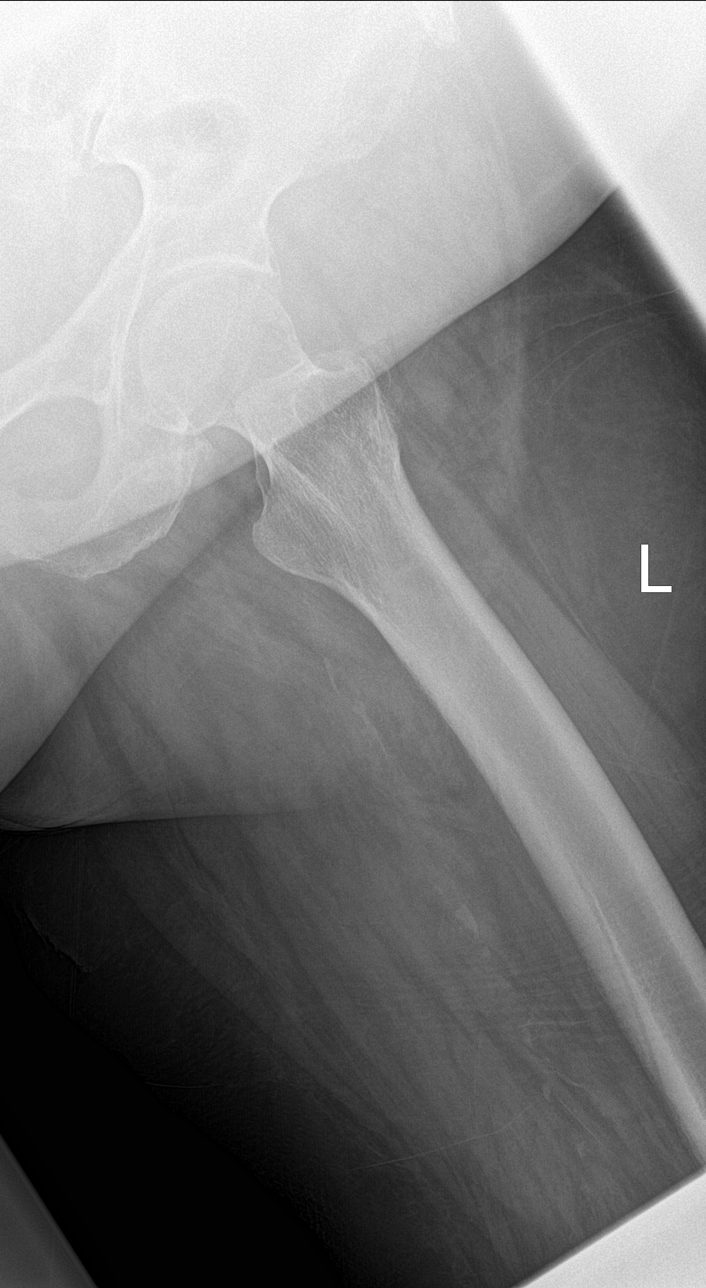

[pelvis ap (2 of 2)]
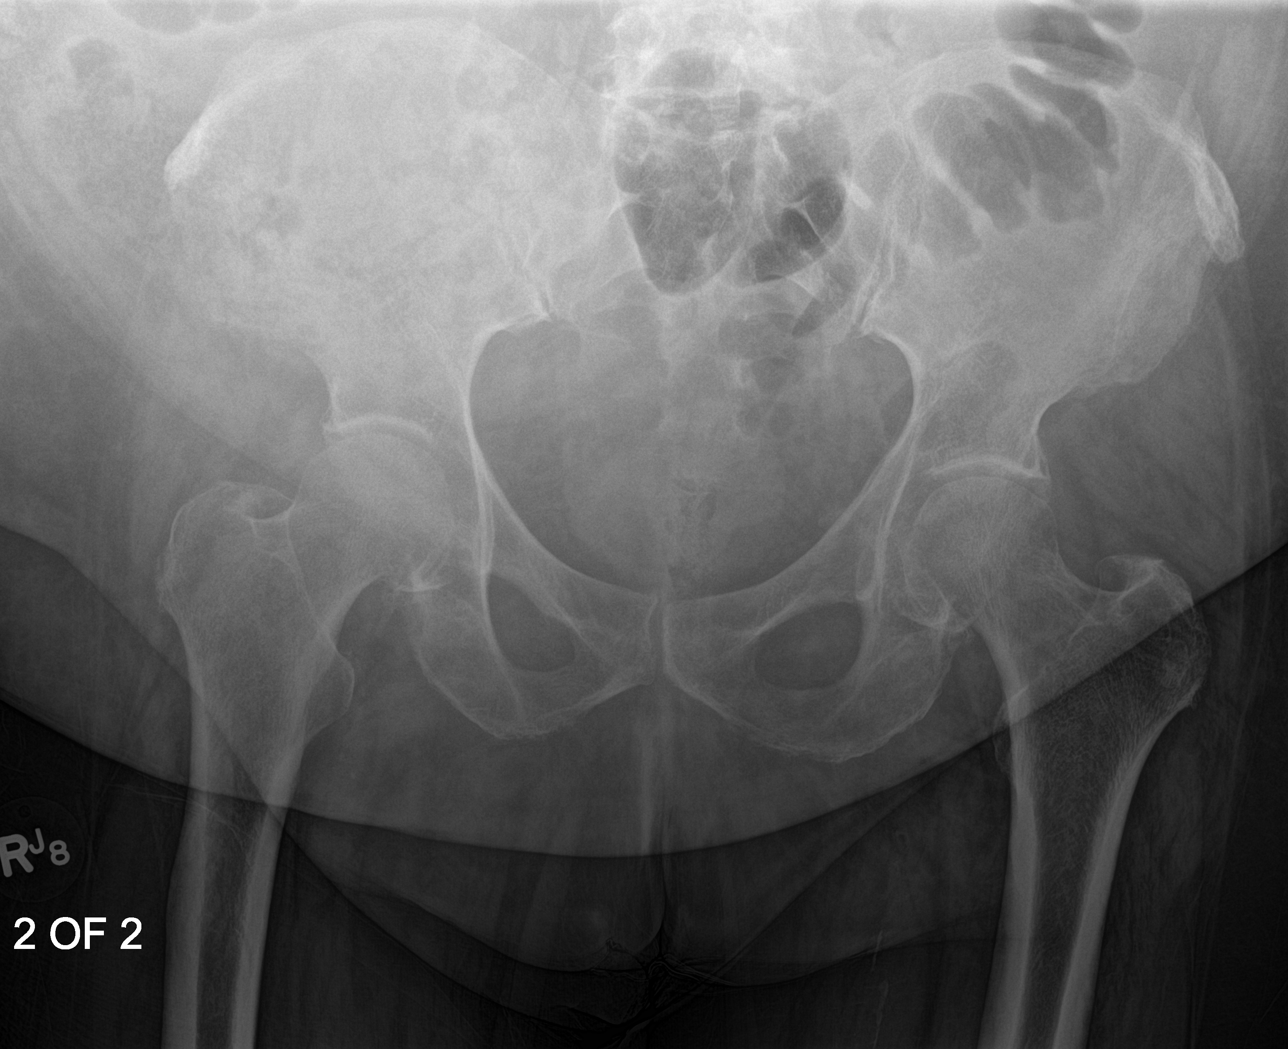

[4 of 4 positions shown; findings below may reference images not displayed]

FINDINGS: Mild left hip joint degenerative changes. Normal anatomic alignment.
No evidence for acute fracture or dislocation. Exam limited due to
body habitus.
IMPRESSION: Exam limited due to body habitus.

No acute osseous abnormality.

## 2017-02-18 IMAGING — CR DG CHEST 2V
2 series · 2 of 2 positions shown · non-contrast
Comparison: June 29, 2014

CLINICAL DATA: Shortness of breath with cough for 3 days

EXAM:
CHEST  2 VIEW

[chest pa]
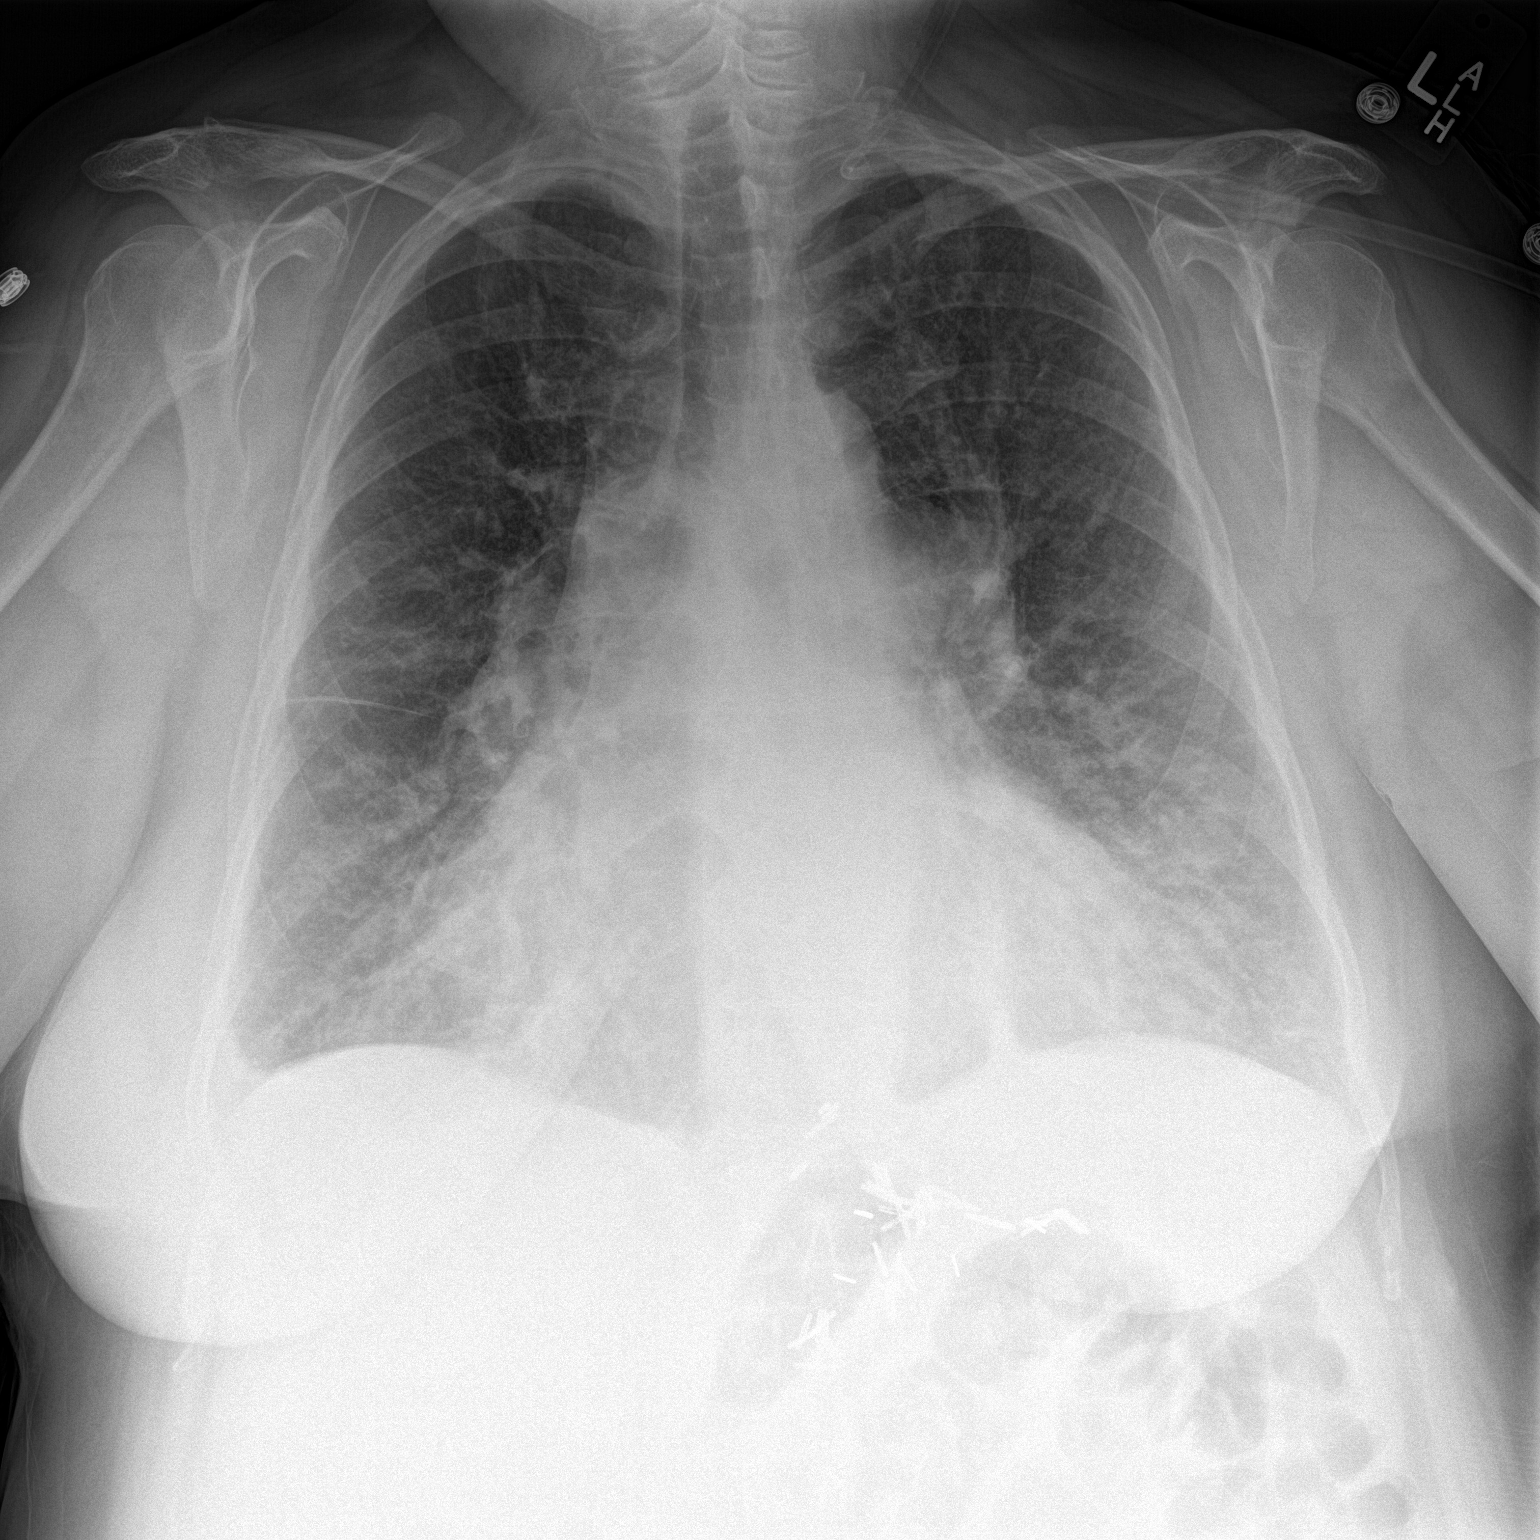

[chest lat]
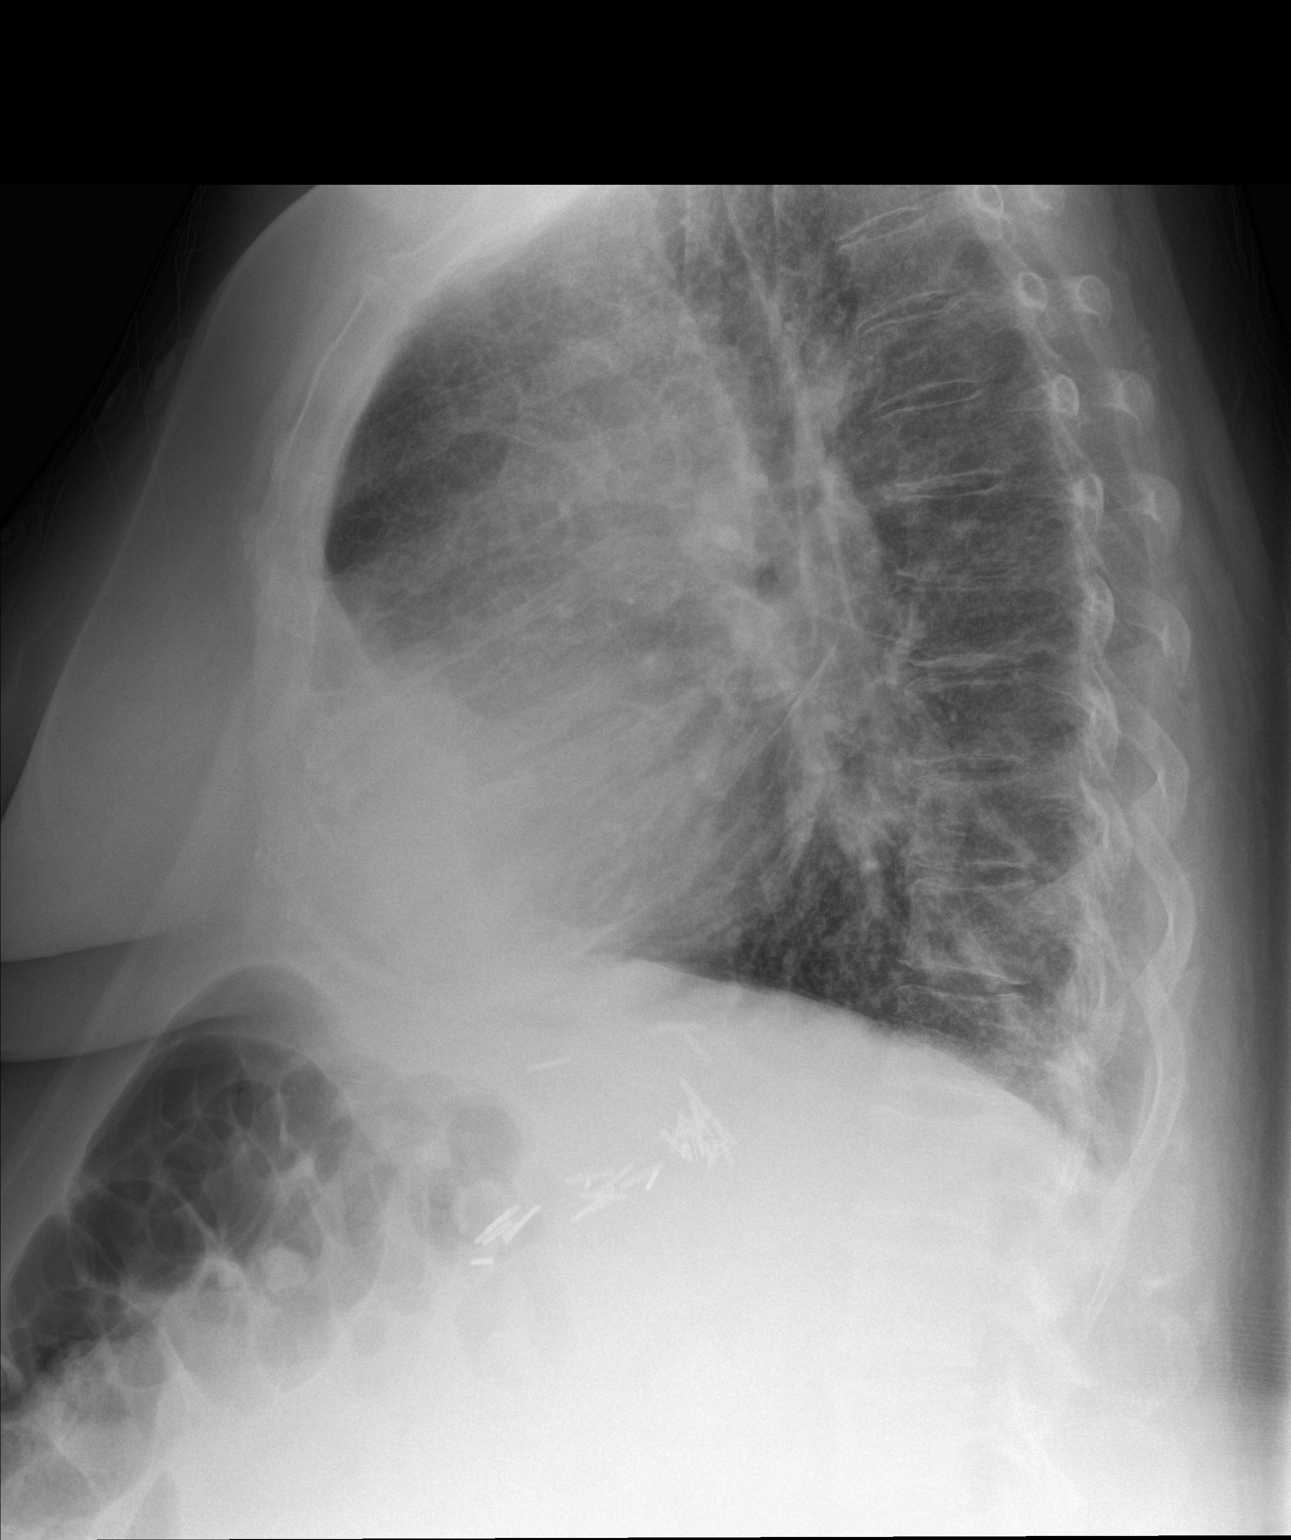

[2 of 2 positions shown; findings below may reference images not displayed]

FINDINGS: There is stable generalized interstitial edema with pulmonary venous
hypertension and cardiomegaly. There is no airspace consolidation.
No adenopathy. There are surgical clips in the upper abdomen. There
is mild degenerative change in the thoracic spine.
IMPRESSION: Stable changes of congestive heart failure.  No new opacity.

## 2017-04-07 IMAGING — CR DG CHEST 1V PORT
1 series · 1 of 1 positions shown · non-contrast
Comparison: Chest radiograph performed 07/15/2014

CLINICAL DATA: Acute onset of shortness of breath and right-sided
chest pain. Initial encounter.

EXAM:
PORTABLE CHEST - 1 VIEW

[AP]
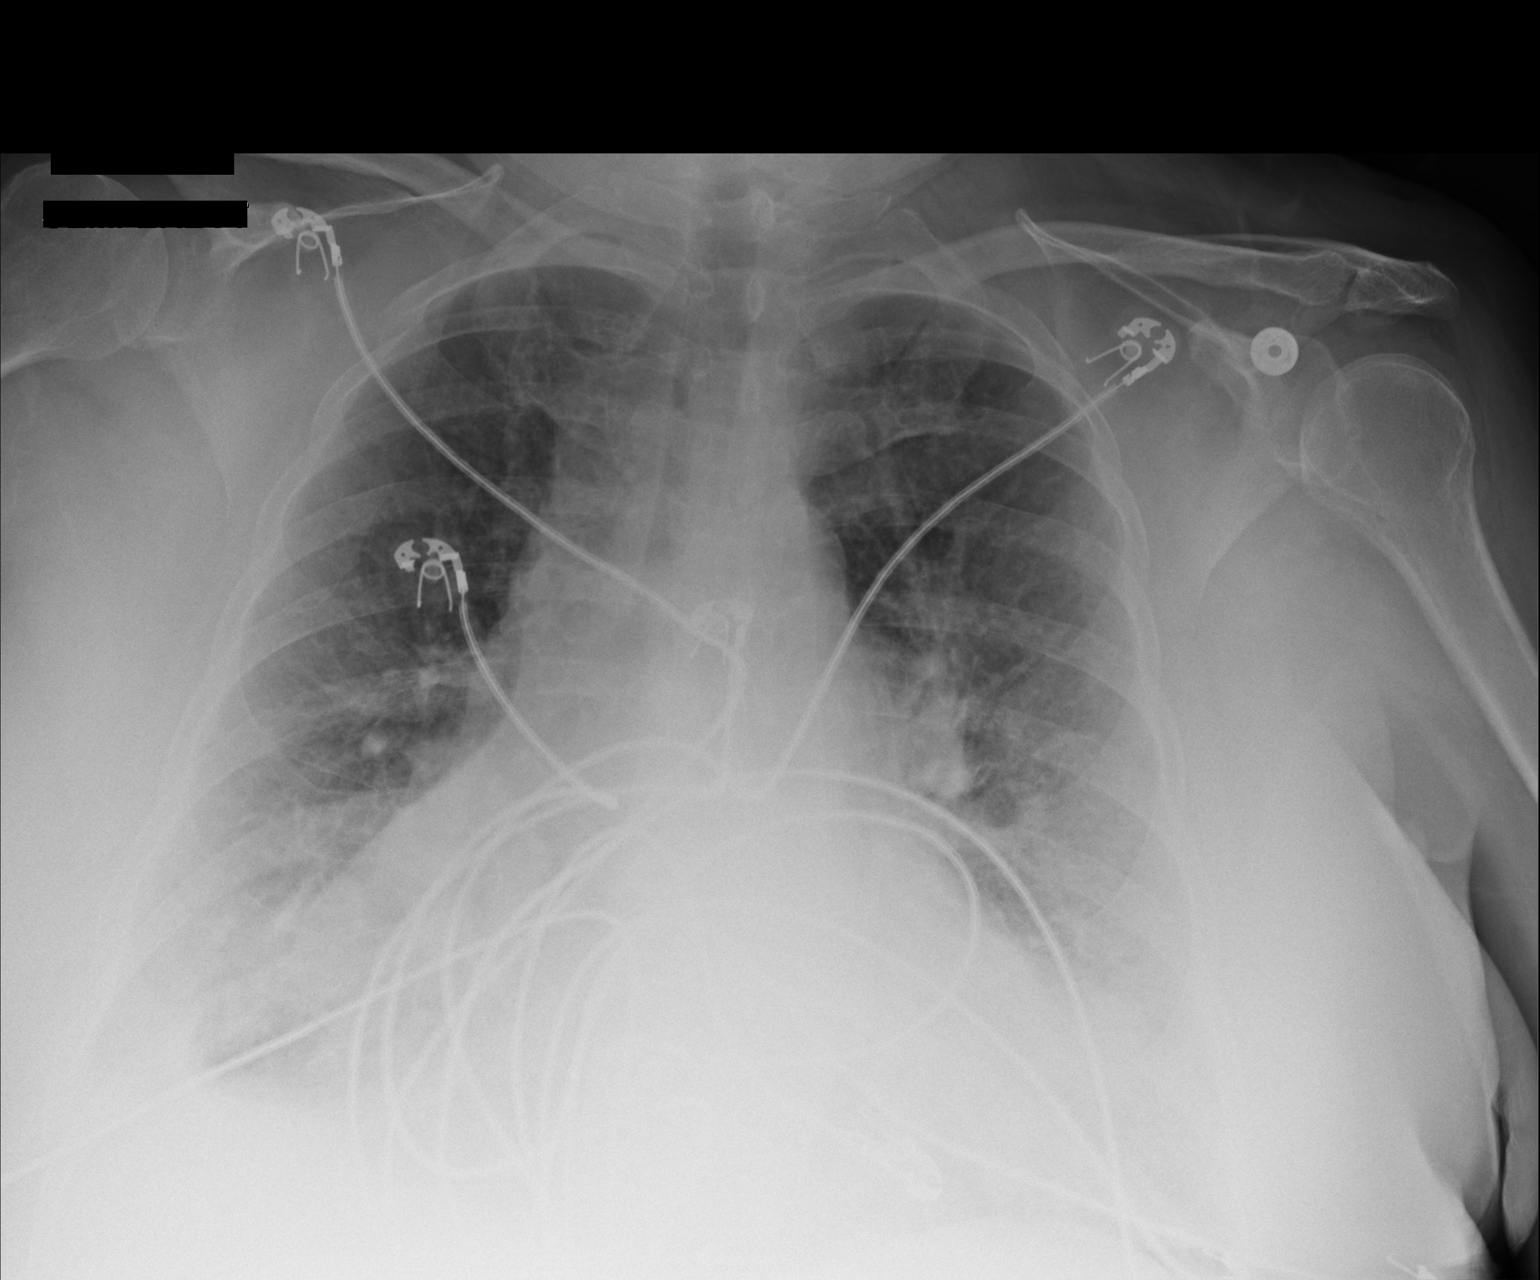

[1 of 1 positions shown; findings below may reference images not displayed]

FINDINGS: The lungs are well-aerated. Vascular congestion is noted. A small
right pleural effusion is noted. Increased interstitial markings
raise concern for pulmonary edema. No pneumothorax is seen.

The cardiomediastinal silhouette is enlarged. No acute osseous
abnormalities are seen.
IMPRESSION: Vascular congestion and cardiomegaly. Small right pleural effusion
noted. Increased interstitial markings raise concern for pulmonary
edema.

## 2017-04-08 IMAGING — CT CT ANGIO CHEST
2 of 8 series · 18 of 46 positions shown · IV contrast (omnipaque)
Comparison: Chest radiograph earlier this day.  Chest CT 09/07/2010

CLINICAL DATA: Right-sided chest pain. Dizziness, lightheaded and
weakness. Shortness of breath. Symptoms for 7-10 days.

EXAM:
CT ANGIOGRAPHY CHEST WITH CONTRAST
TECHNIQUE: Multidetector CT imaging of the chest was performed using the
standard protocol during bolus administration of intravenous
contrast. Multiplanar CT image reconstructions and MIPs were
obtained to evaluate the vascular anatomy.
CONTRAST:  80mL OMNIPAQUE IOHEXOL 350 MG/ML SOLN

[Series 5: thins · axial · 0.72mm/px · z∈[-295,-21]mm · 15 of 302 slices shown]
[im 14/302  lung]
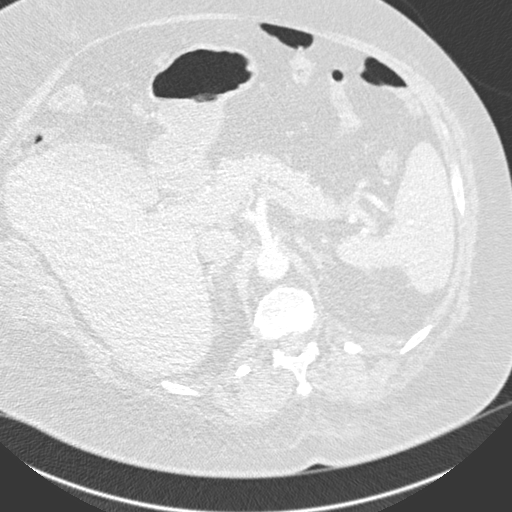
[im 42/302  soft-tissue]
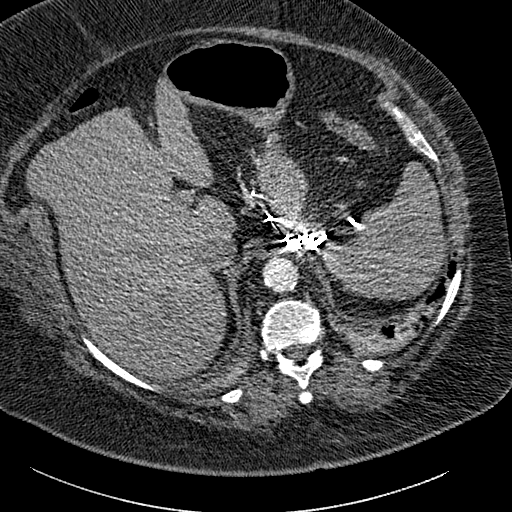
[im 55/302  lung]
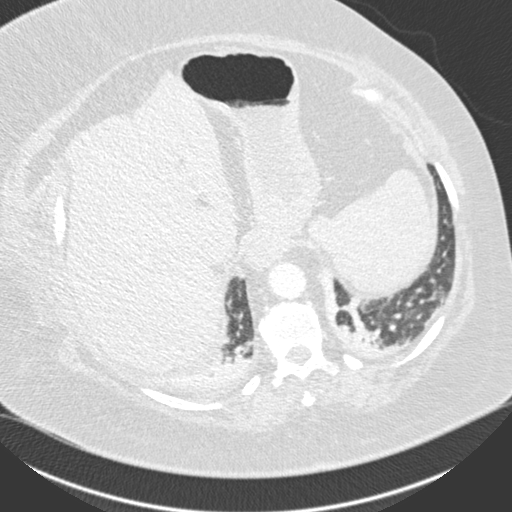
[im 69/302  soft-tissue]
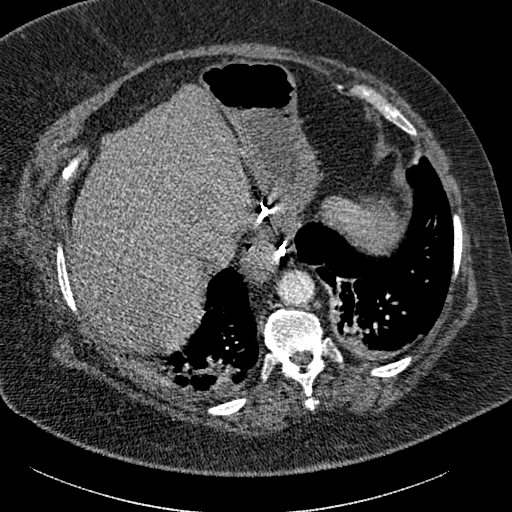
[im 96/302  lung]
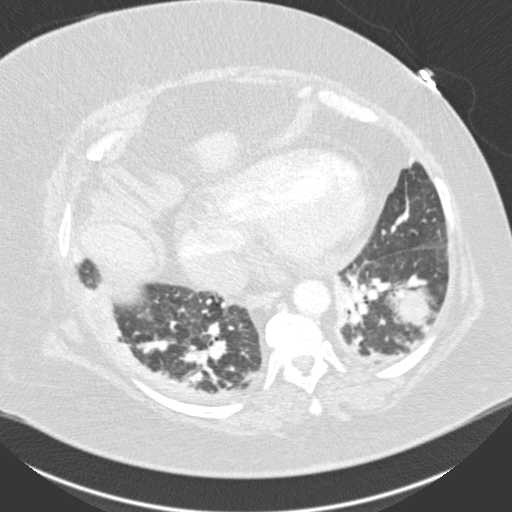
[im 110/302  soft-tissue]
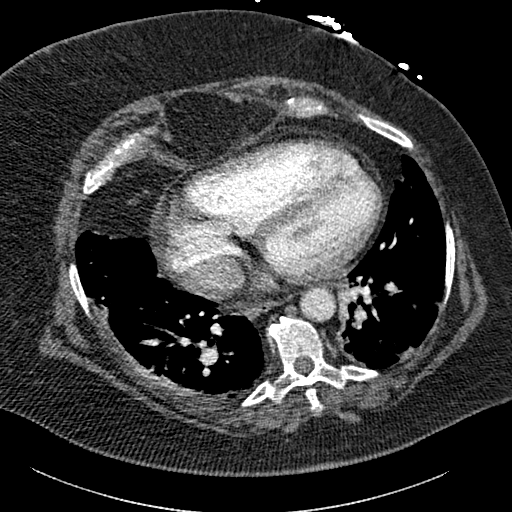
[im 137/302  lung]
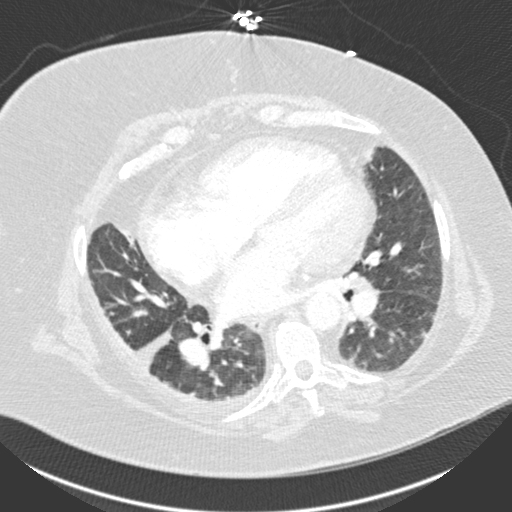
[im 151/302  soft-tissue]
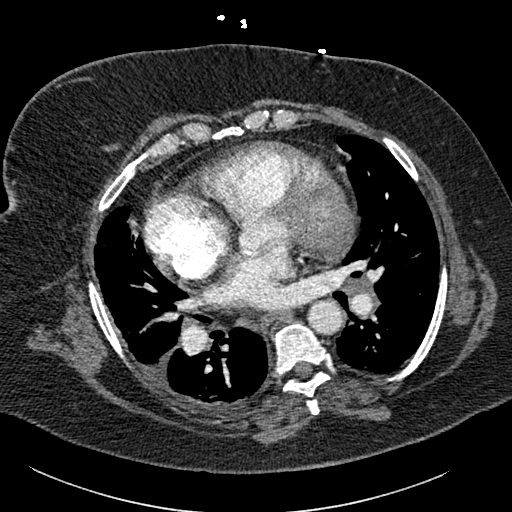
[im 165/302  lung]
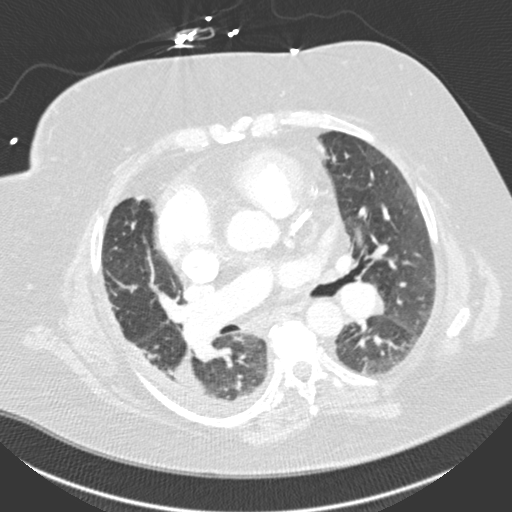
[im 192/302  soft-tissue]
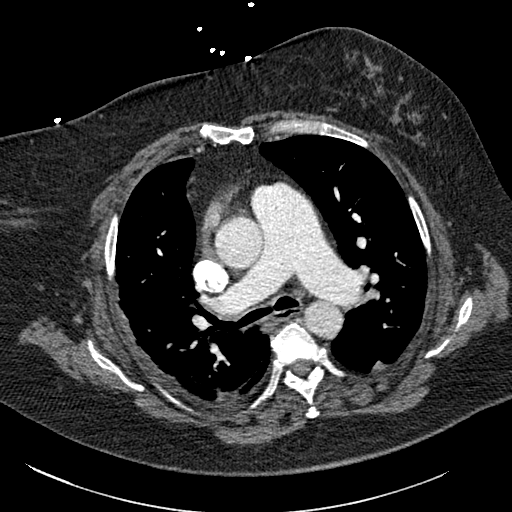
[im 206/302  lung]
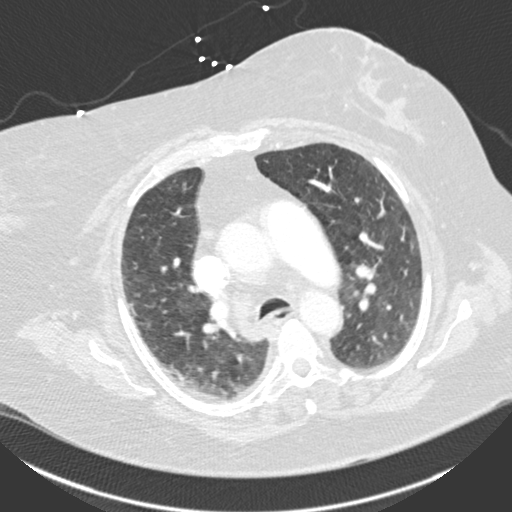
[im 233/302  soft-tissue]
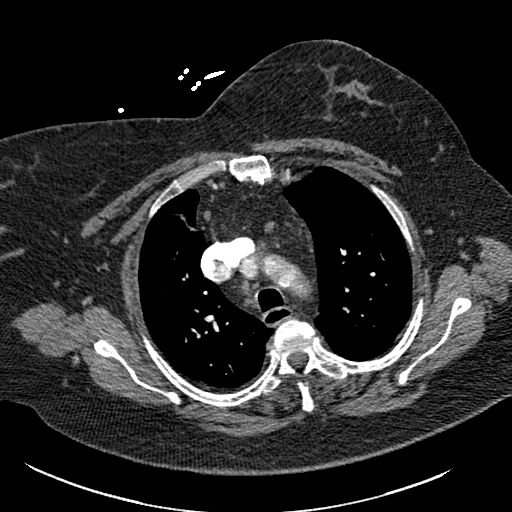
[im 247/302  lung]
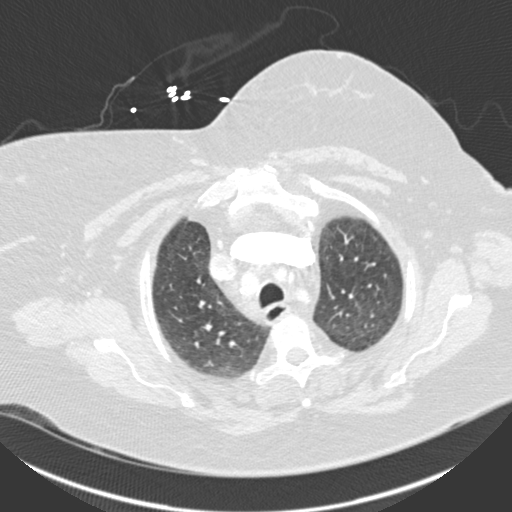
[im 260/302  soft-tissue]
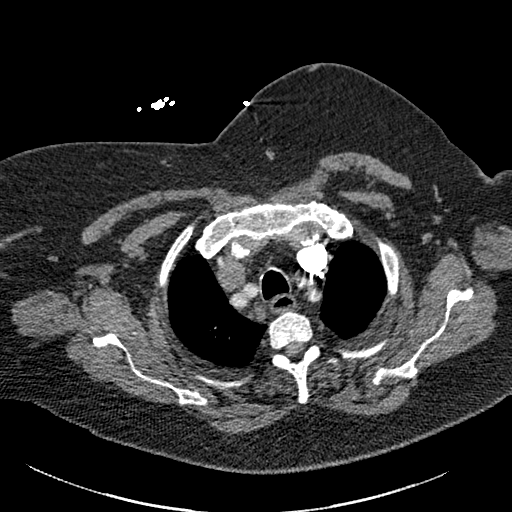
[im 288/302  lung]
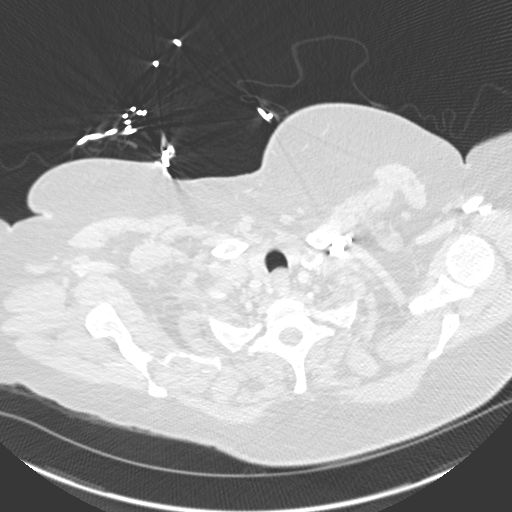

[Series 7: coronal mpr · coronal · 0.59mm/px · 3 of 168 slices shown]
[im 42/168  soft-tissue]
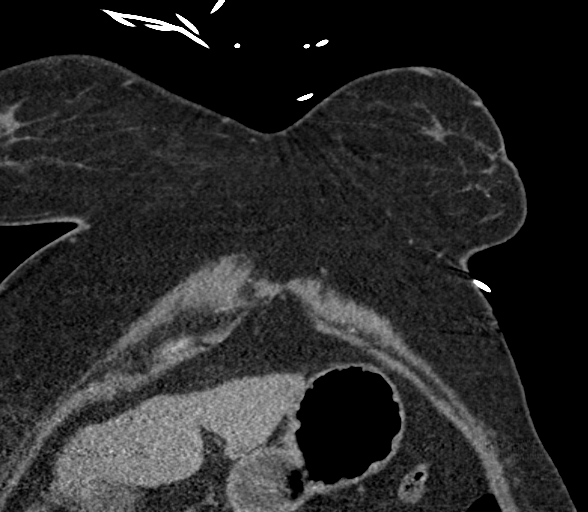
[im 84/168  soft-tissue]
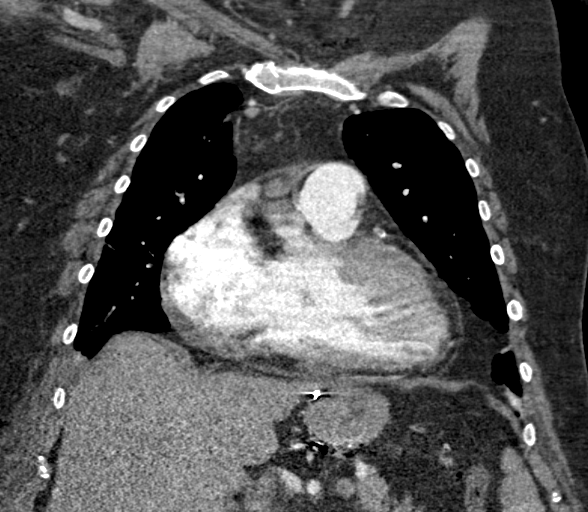
[im 126/168  soft-tissue]
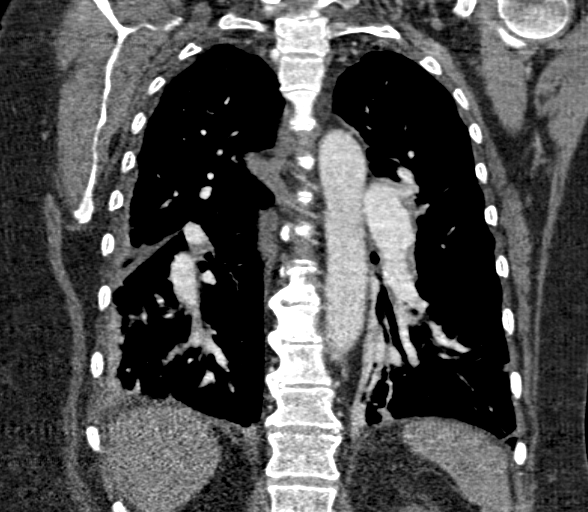

[18 of 46 positions shown; findings below may reference images not displayed]

FINDINGS: There are no filling defects within the pulmonary arteries to
suggest pulmonary embolus.

There is moderate multi chamber cardiomegaly. Enlargement of the
main pulmonary artery measuring 4.1 cm. The thoracic aorta is normal
in caliber with mild atherosclerosis. Coronary artery calcifications
are seen.

Diminutive right pleural effusion. Small amount of fluid in the
right minor fissure. No left pleural effusion. Physiologic
pericardial fluid without effusion.

Prevascular lymph node measures 10 mm in short axis dimension. Right
upper paratracheal lymph node measures 7 mm in short axis dimension.
Right lower paratracheal lymph node measures 11 mm in short axis
dimension. Left hilar lymph node measures 7 mm.

Within the left lower lobe there is a 2.9 x 2.0 x 2.8 cm nodular
opacity with surrounding ill-defined margins and mild surrounding
ground-glass opacity. Mild smooth septal thickening is seen. There
is scattered linear atelectasis without definite additional
pulmonary nodule. Trachea and mainstem bronchi are patent.

Evaluation of the upper abdomen demonstrates postsurgical change at
the gastroesophageal junction with small hiatal hernia. Multiple
surgical clips in the left upper quadrant of the abdomen. No
definite acute abnormality.

Degenerative change in the spine. No lytic or blastic osseous
lesion.

Review of the MIP images confirms the above findings.
IMPRESSION: 1. No pulmonary embolus.
2. Left lower lobe 2.9 cm nodular opacity. Surrounding ill-defined
margins raise concern for malignancy, however infectious or
inflammatory etiologies are also considered. If there signs or
symptoms concerning for infection, follow-up CT after course of
treatment is recommended. Further characterization could be
considered with PET-CT. Shotty mediastinal lymph nodes that are
nonspecific.
3. Multi chamber cardiomegaly with dilatation of the main pulmonary
artery, can be seen with pulmonary arterial hypertension.
4. Small right pleural effusion and fluid in the minor fissure.
Minimal septal thickening. Findings may reflect fluid overload/mild
CHF.

## 2017-05-01 IMAGING — CR DG CHEST 1V PORT
1 series · 1 of 1 positions shown · non-contrast
Comparison: 09/01/2014

CLINICAL DATA: CHF

EXAM:
PORTABLE CHEST - 1 VIEW

[AP]
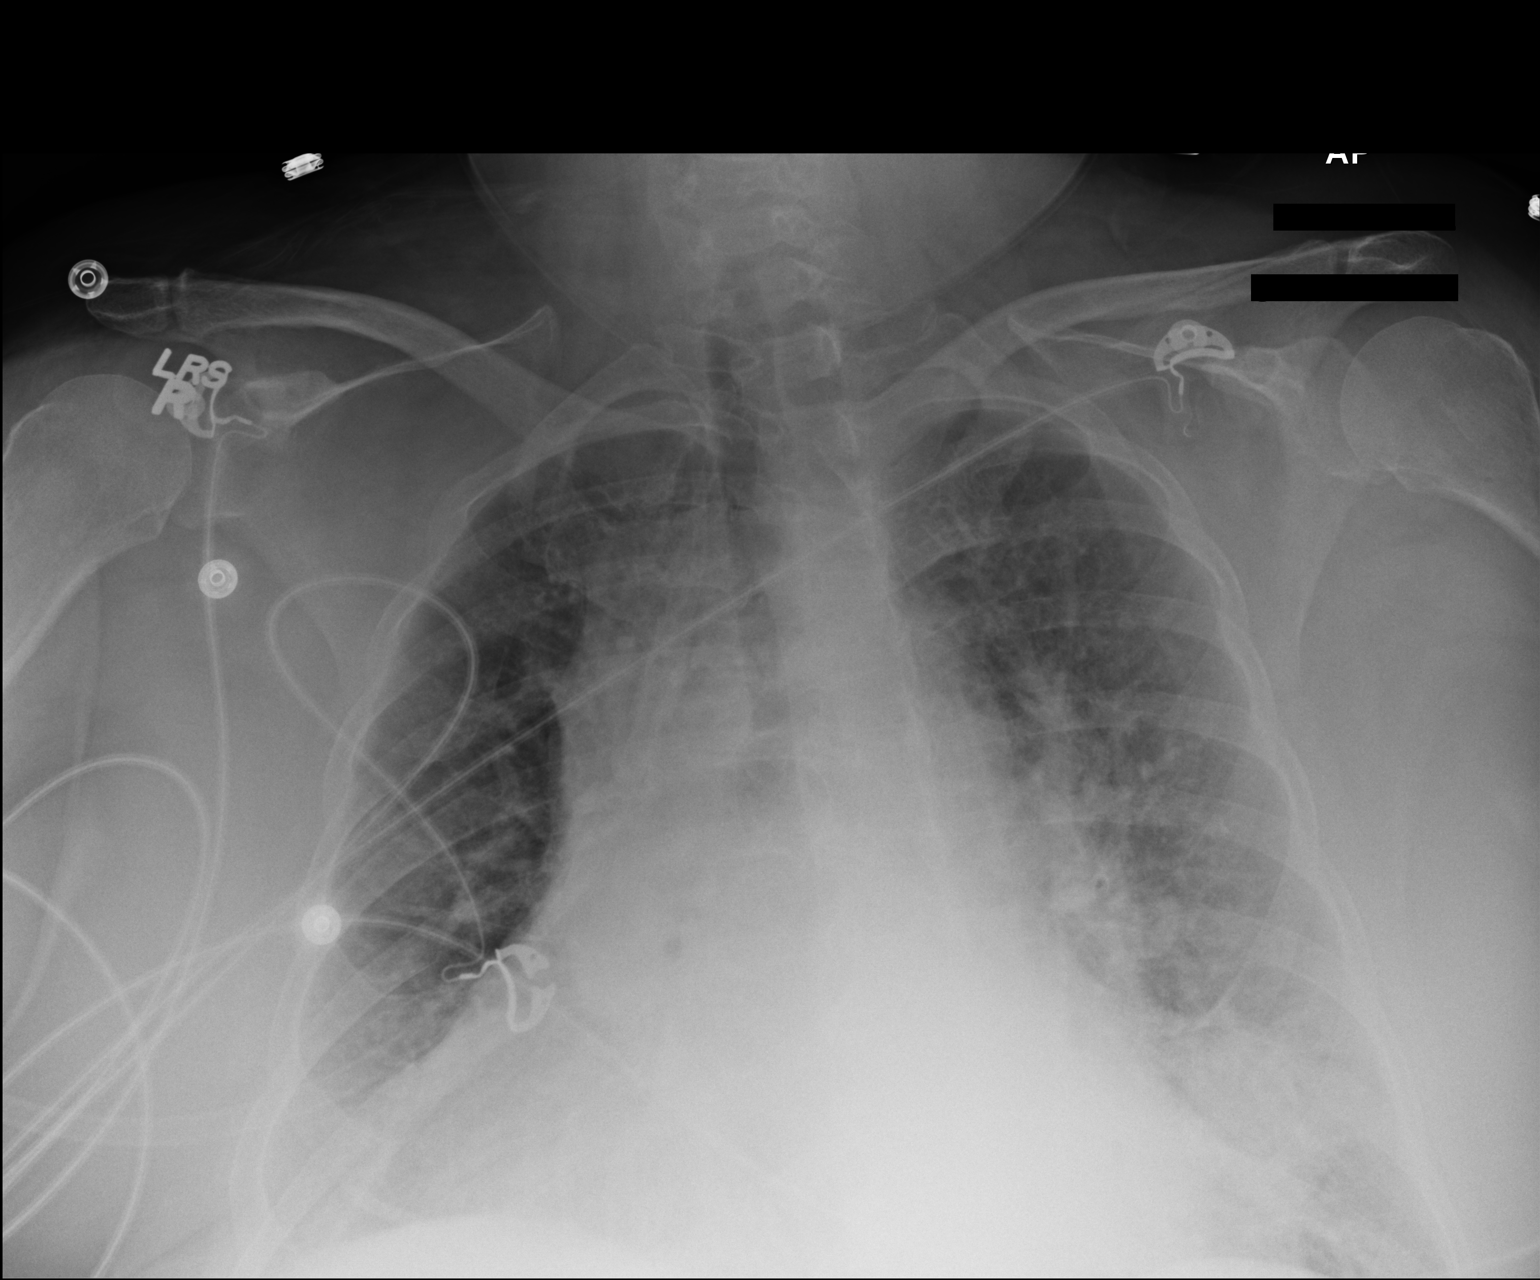

[1 of 1 positions shown; findings below may reference images not displayed]

FINDINGS: Cardiac enlargement. Progression of bilateral airspace disease left
greater than right with associated vascular congestion. Findings are
most consistent with congestive heart failure with progression.
Bibasilar atelectasis without significant effusion.
IMPRESSION: Progression of congestive heart failure with edema.

## 2017-06-04 IMAGING — DX DG CHEST 2V
2 series · 2 of 2 positions shown · non-contrast
Comparison: CT 09/02/2014

CLINICAL DATA: Shortness of breath and chest pain

EXAM:
CHEST  2 VIEW

[chest lat]
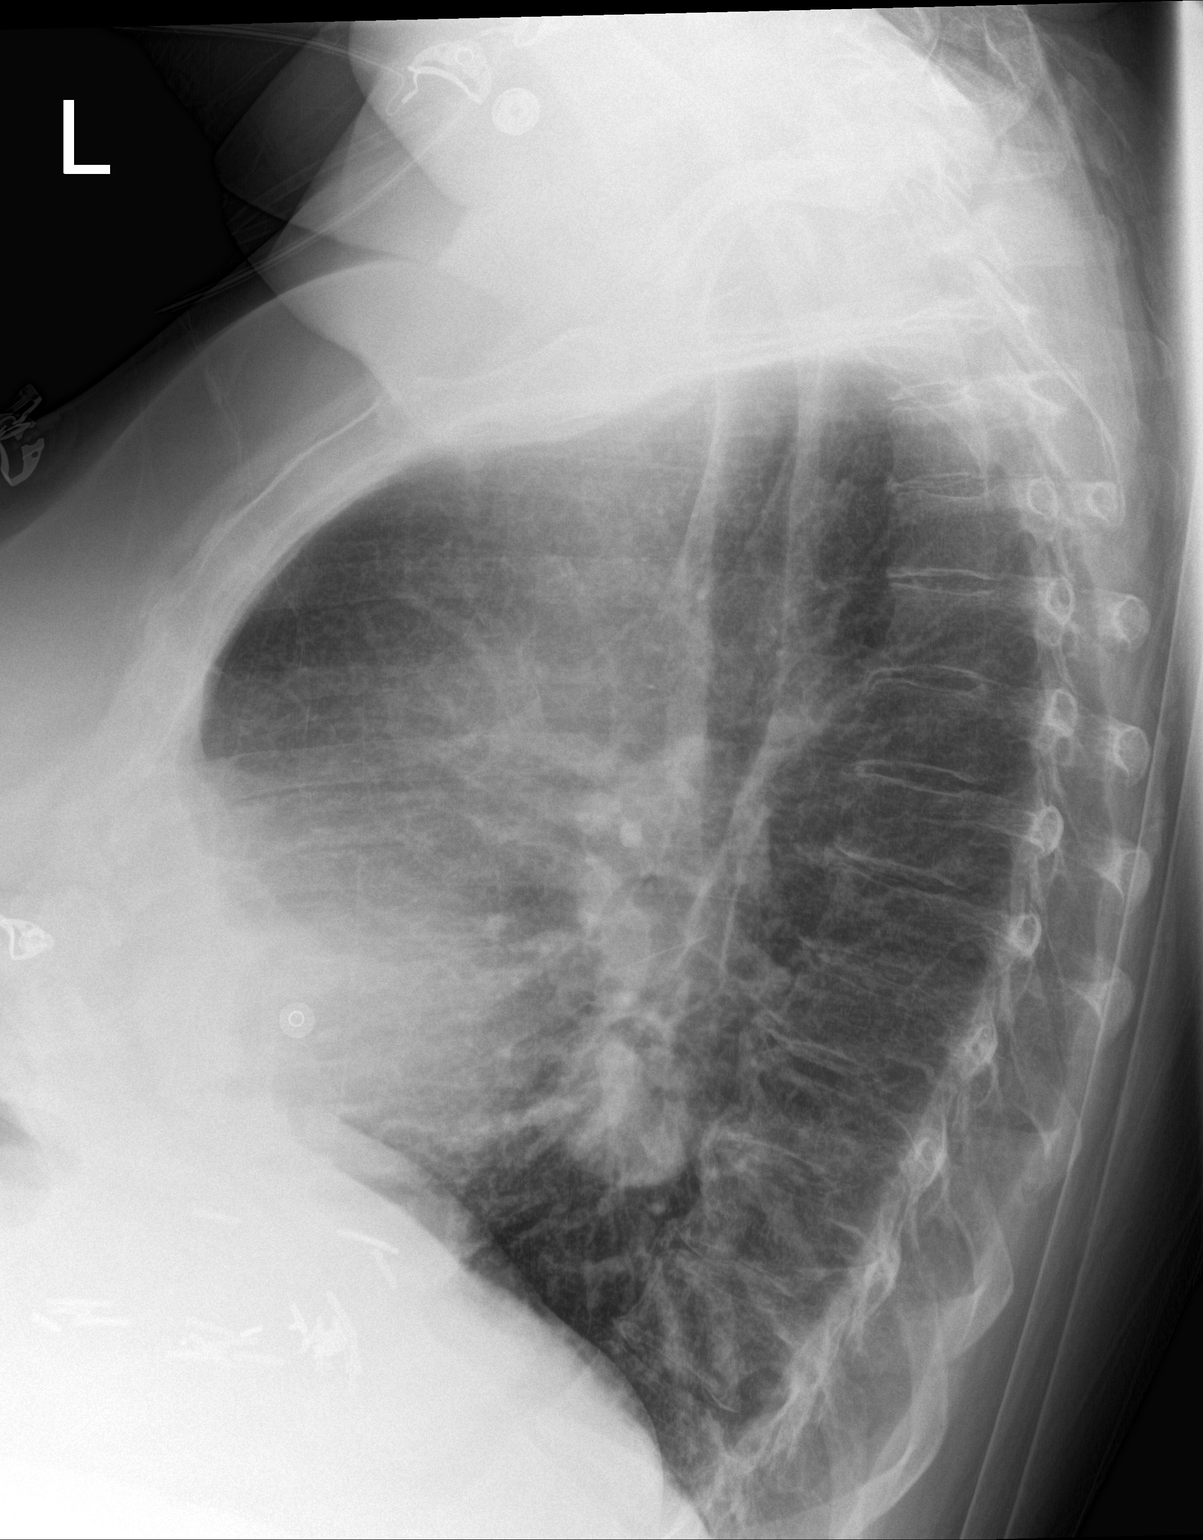

[chest ap]
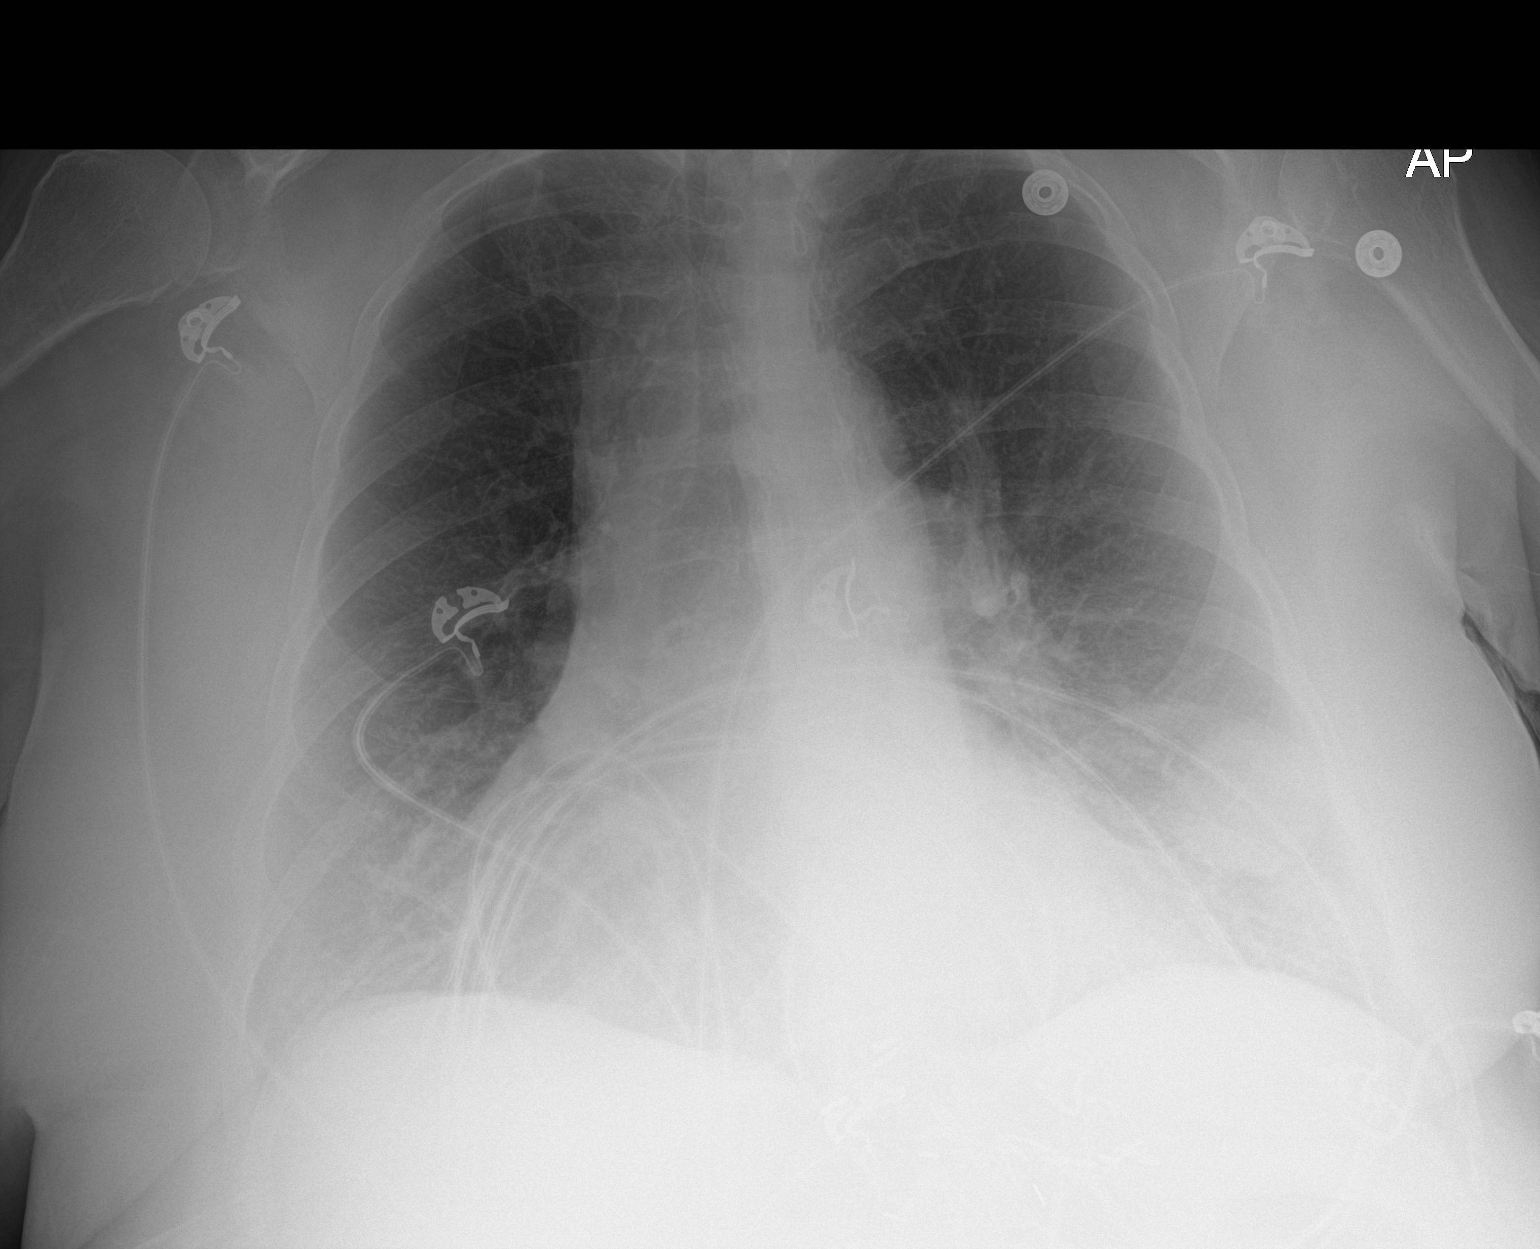

[2 of 2 positions shown; findings below may reference images not displayed]

FINDINGS: Heart size is moderately enlarged. No pleural effusion or edema. No
airspace consolidation. Left lower lobe lung mass is again noted
measuring 3.9 cm and suspicious for malignancy.
IMPRESSION: 1. No acute findings.
2. Persistent left lower lobe lung mass worrisome for malignancy.

## 2017-06-05 IMAGING — CT CT ANGIO CHEST
2 of 9 series · 18 of 46 positions shown · IV contrast (Omni 300)
Comparison: 09/02/2014

CLINICAL DATA: Dyspnea and chest pain, onset today

EXAM:
CT ANGIOGRAPHY CHEST WITH CONTRAST
TECHNIQUE: Multidetector CT imaging of the chest was performed using the
standard protocol during bolus administration of intravenous
contrast. Multiplanar CT image reconstructions and MIPs were
obtained to evaluate the vascular anatomy.
CONTRAST:  100mL OMNIPAQUE IOHEXOL 350 MG/ML SOLN

[Series 5: thins · axial · 0.65mm/px · z∈[-278,-6]mm · 15 of 307 slices shown]
[im 18/307  lung]
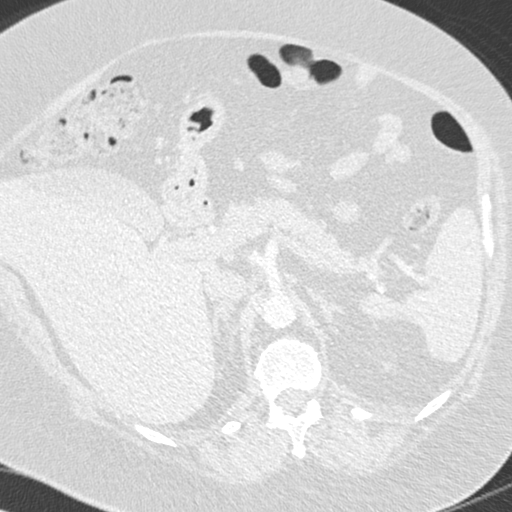
[im 35/307  soft-tissue]
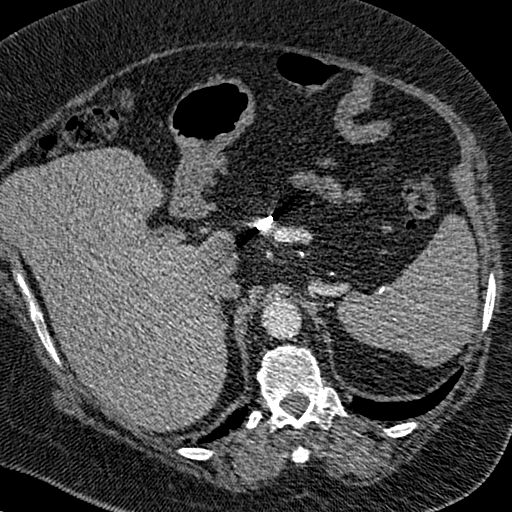
[im 52/307  lung]
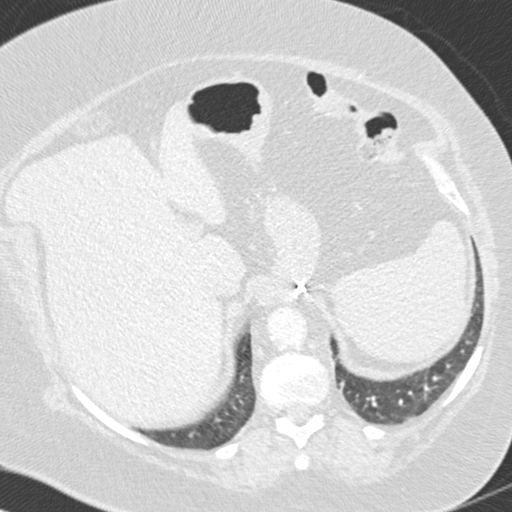
[im 69/307  soft-tissue]
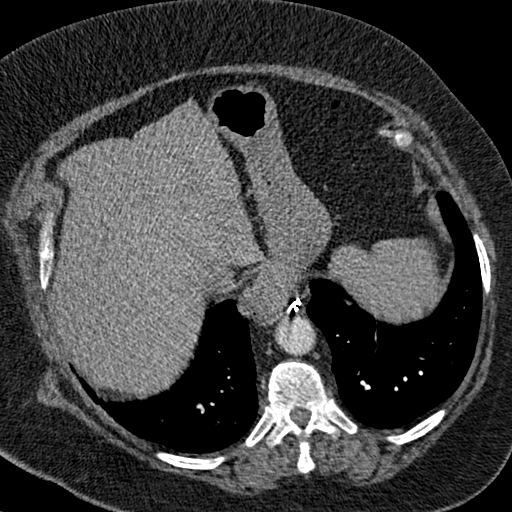
[im 103/307  lung]
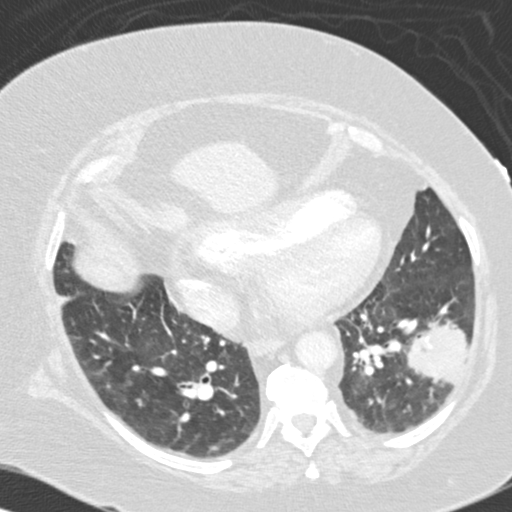
[im 120/307  soft-tissue]
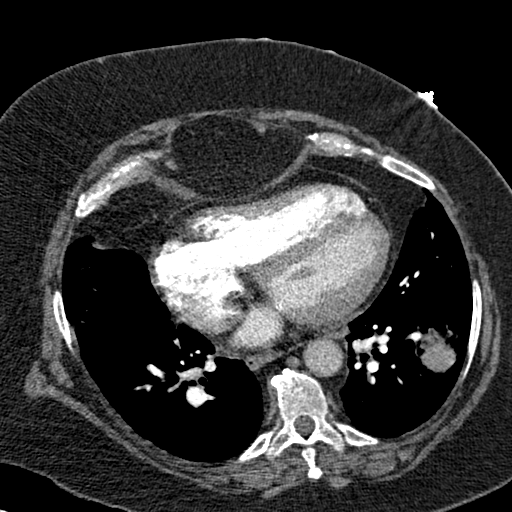
[im 137/307  lung]
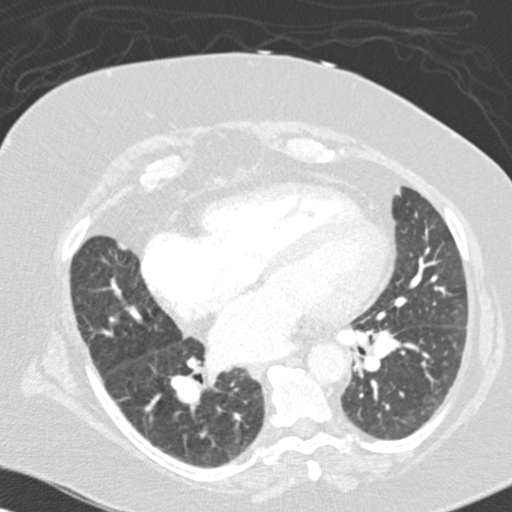
[im 154/307  soft-tissue]
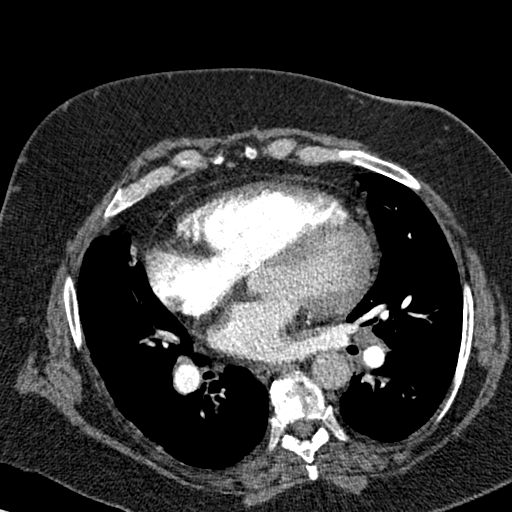
[im 171/307  lung]
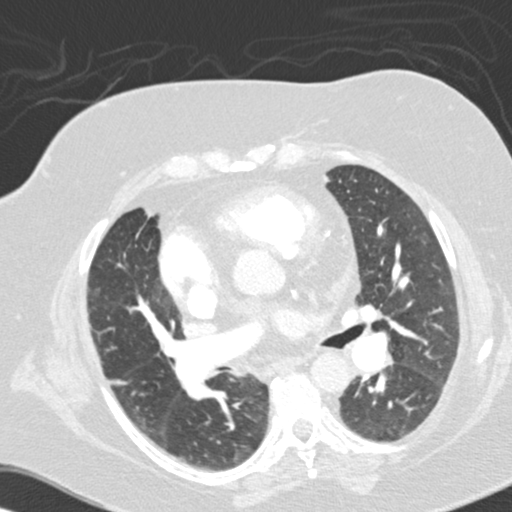
[im 188/307  soft-tissue]
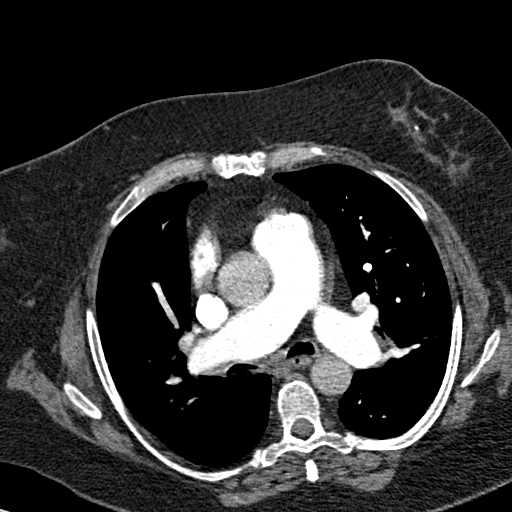
[im 205/307  lung]
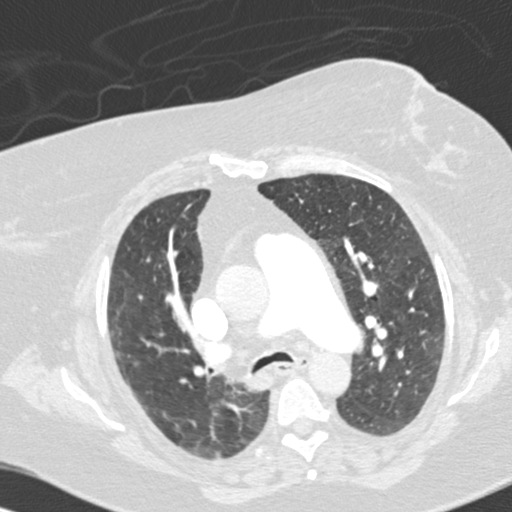
[im 239/307  soft-tissue]
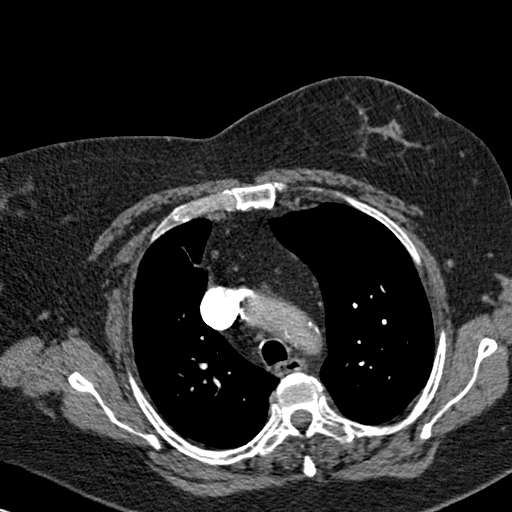
[im 256/307  lung]
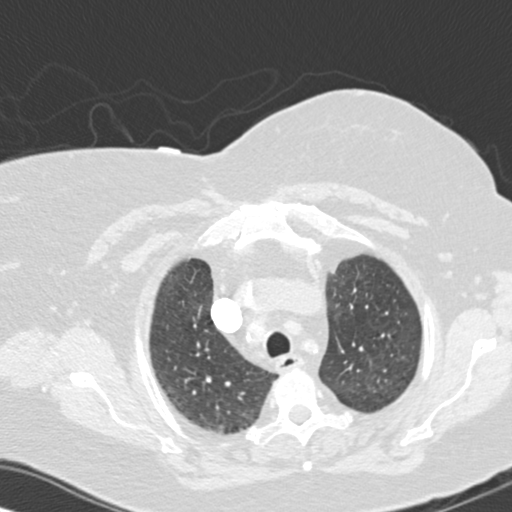
[im 273/307  soft-tissue]
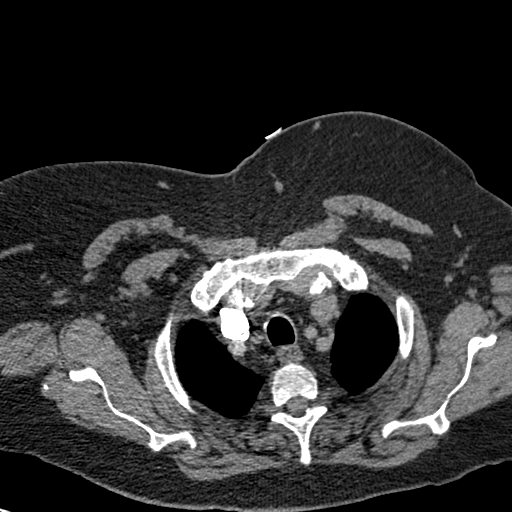
[im 290/307  lung]
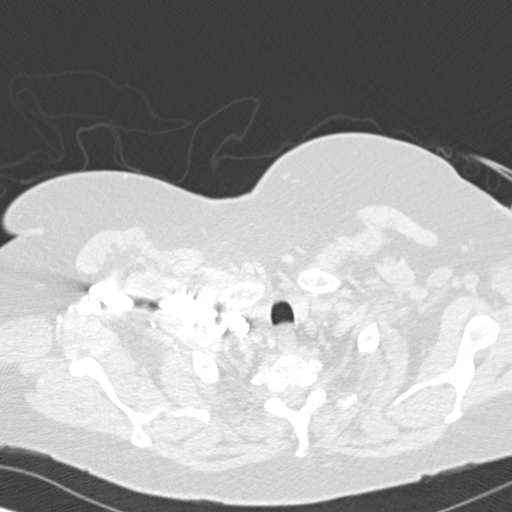

[Series 7: coronal mpr · coronal · 0.61mm/px · 3 of 126 slices shown]
[im 32/126  soft-tissue]
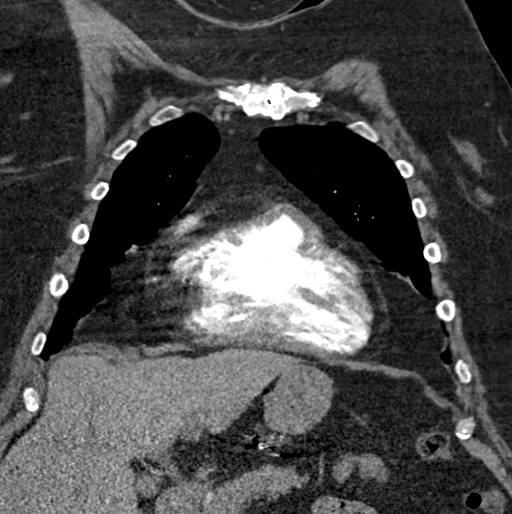
[im 63/126  soft-tissue]
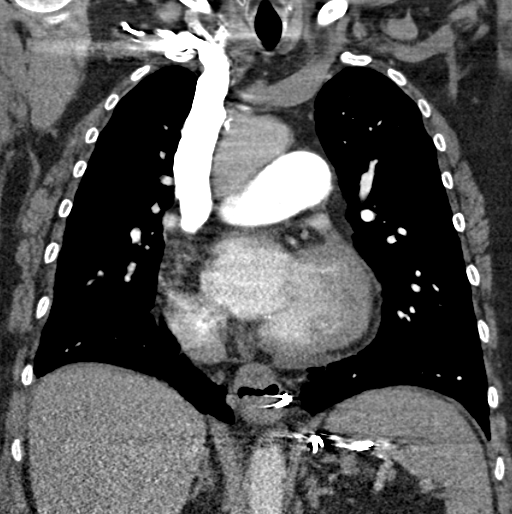
[im 94/126  soft-tissue]
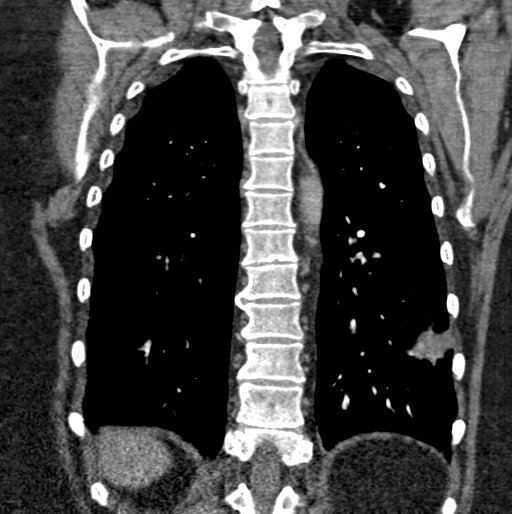

[18 of 46 positions shown; findings below may reference images not displayed]

FINDINGS: Cardiovascular: There is good opacification of the pulmonary
arteries. There is no pulmonary embolism. The thoracic aorta is
normal in caliber and intact. There is mild enlargement of the
central pulmonary arteries, raising the question of pulmonary
arterial hypertension.

Lungs: The left lower lobe mass has enlarged, measuring 3.6 x 4.2 by
3.8 cm. and previously measuring 2.0 x 2.8 x 2.8 cm. Neoplasm cannot
be excluded, although the rate of enlargement would be more
compatible with an inflammatory lesion. No other masses are evident.

Central airways: Patent

Effusions: None

Lymphadenopathy: Moderately prominent left hilar nodes measuring 10
mm and 13 mm in short axis. These could be reactive but are not
conclusively characterized. There are a few physiologic -appearing
mediastinal nodes and axillary nodes.

Esophagus: Unremarkable.  Moderate hiatal hernia.

Upper abdomen: Hiatal hernia. Prior surgery around the GE junction
and proximal stomach.

Musculoskeletal: No significant musculoskeletal lesion.

Review of the MIP images confirms the above findings.
IMPRESSION: 1. Negative for pulmonary embolism.
2. Mild enlargement of the central pulmonary arteries. This can be
seen with pulmonary arterial hypertension.
3. 4 cm left lower lobe lung mass, enlarged from 09/02/2014 when it
measured just under 3 cm. Neoplastic or inflammatory etiologies are
both possible. There also are moderately prominent left hilar nodes.
Consider PET-CT for characterization and staging.

## 2017-06-18 IMAGING — PT NM PET TUM IMG INITIAL (PI) SKULL BASE T - THIGH
1 of 8 series · 1 of 25 positions shown · non-contrast
Comparison: Most recent chest CT from 10/30/2014.

CLINICAL DATA: Initial treatment strategy for left lower lobe lung
mass.

EXAM:
NUCLEAR MEDICINE PET SKULL BASE TO THIGH
TECHNIQUE: 16.6 mCi F-18 FDG was injected intravenously. Full-ring PET imaging
was performed from the skull base to thigh after the radiotracer. CT
data was obtained and used for attenuation correction and anatomic
localization.
FASTING BLOOD GLUCOSE:  Value: 160 mg/dl

[Series 4: ct sk_thigh 5.0 b31f · axial · 5.0mm · 0.98mm/px · 1 of 211 slices shown]
[im 211/211  brain]
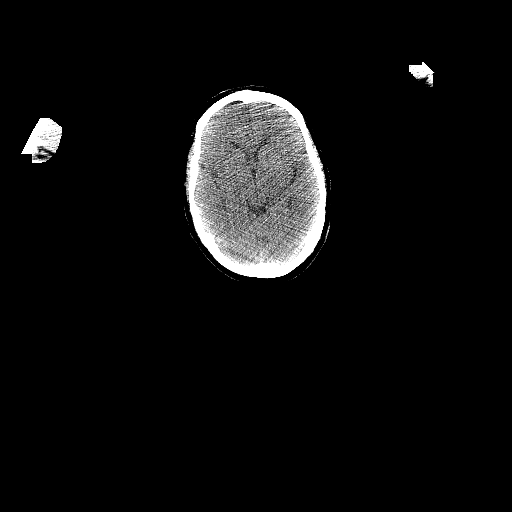

[1 of 25 positions shown; findings below may reference images not displayed]

FINDINGS: NECK

No hypermetabolic lymph nodes in the neck.

CHEST

There is an intensely hypermetabolic 5.3 x 4.6 cm left lower lobe
lung mass (series 6/image 50) with max SUV 25.0, which demonstrates
irregular margins, interval increased size from 4.2 x 4.0 cm on
10/30/2014, in keeping with a primary bronchogenic carcinoma. This
lung mass abuts the visceral pleural surface, with no definite chest
wall invasion.

There are no additional significant pulmonary nodules or lung
masses. No acute consolidative airspace disease. Mild scarring
versus atelectasis in the right middle lobe, lingula and left lower
lobe.

There is a mildly FDG avid mildly enlarged left infrahilar node
measuring 1.0 cm short axis (series 4/image 74) with max SUV 3.2.
There are no additional enlarged or hypermetabolic hilar nodes.

No hypermetabolic or enlarged mediastinal nodes. A 0.8 cm short axis
right paratracheal node (4/52) is unchanged in size since 09/07/2010
chest CT and demonstrates a max SUV of 2.8, equal to the mediastinal
blood pool activity.

There is no pleural effusion or pneumothorax. There is mild
cardiomegaly. There is atherosclerosis of the thoracic aorta, the
great vessels of the mediastinum and the coronary arteries,
including calcified atherosclerotic plaque in the left anterior
descending, left circumflex and right coronary arteries. The main
pulmonary artery is dilated (3.4 cm diameter), unchanged.

ABDOMEN/PELVIS

No abnormal hypermetabolic activity within the liver, pancreas,
adrenal glands, or spleen. No hypermetabolic lymph nodes in the
abdomen or pelvis. Surgical clips are again noted surrounding the
proximal stomach. Infrarenal 3.2 cm abdominal aortic aneurysm.
Status post hysterectomy, with no abnormal findings at the vaginal
cuff. No adnexal abnormality.

SKELETON

No focal hypermetabolic activity to suggest skeletal metastasis.
IMPRESSION: 1. Enlarging 5.3 cm intensely hypermetabolic left lower lobe lung
mass, in keeping with a T2b primary bronchogenic carcinoma.
2. Mildly FDG avid left infrahilar node, possibly metastatic.
Otherwise no evidence of hypermetabolic metastatic disease.
3. Atherosclerosis, including three-vessel coronary artery disease.
Please note that although the presence of coronary artery calcium
documents the presence of coronary artery disease, the severity of
this disease and any potential stenosis cannot be assessed on this
non-gated CT examination. Assessment for potential risk factor
modification, dietary therapy or pharmacologic therapy may be
warranted, if clinically indicated.
4. Infrarenal 3.2 cm abdominal aortic aneurysm. Recommend followup
by ultrasound in 3 years. This recommendation follows ACR consensus
guidelines: White Paper of the ACR Incidental Findings Committee II

## 2017-06-24 IMAGING — CR DG CHEST 1V
1 series · 1 of 1 positions shown · non-contrast
Comparison: 11/18/2014 and 10/29/2014

CLINICAL DATA: Follow-up after left lung lesion biopsy.

EXAM:
CHEST  1 VIEW

[AP]
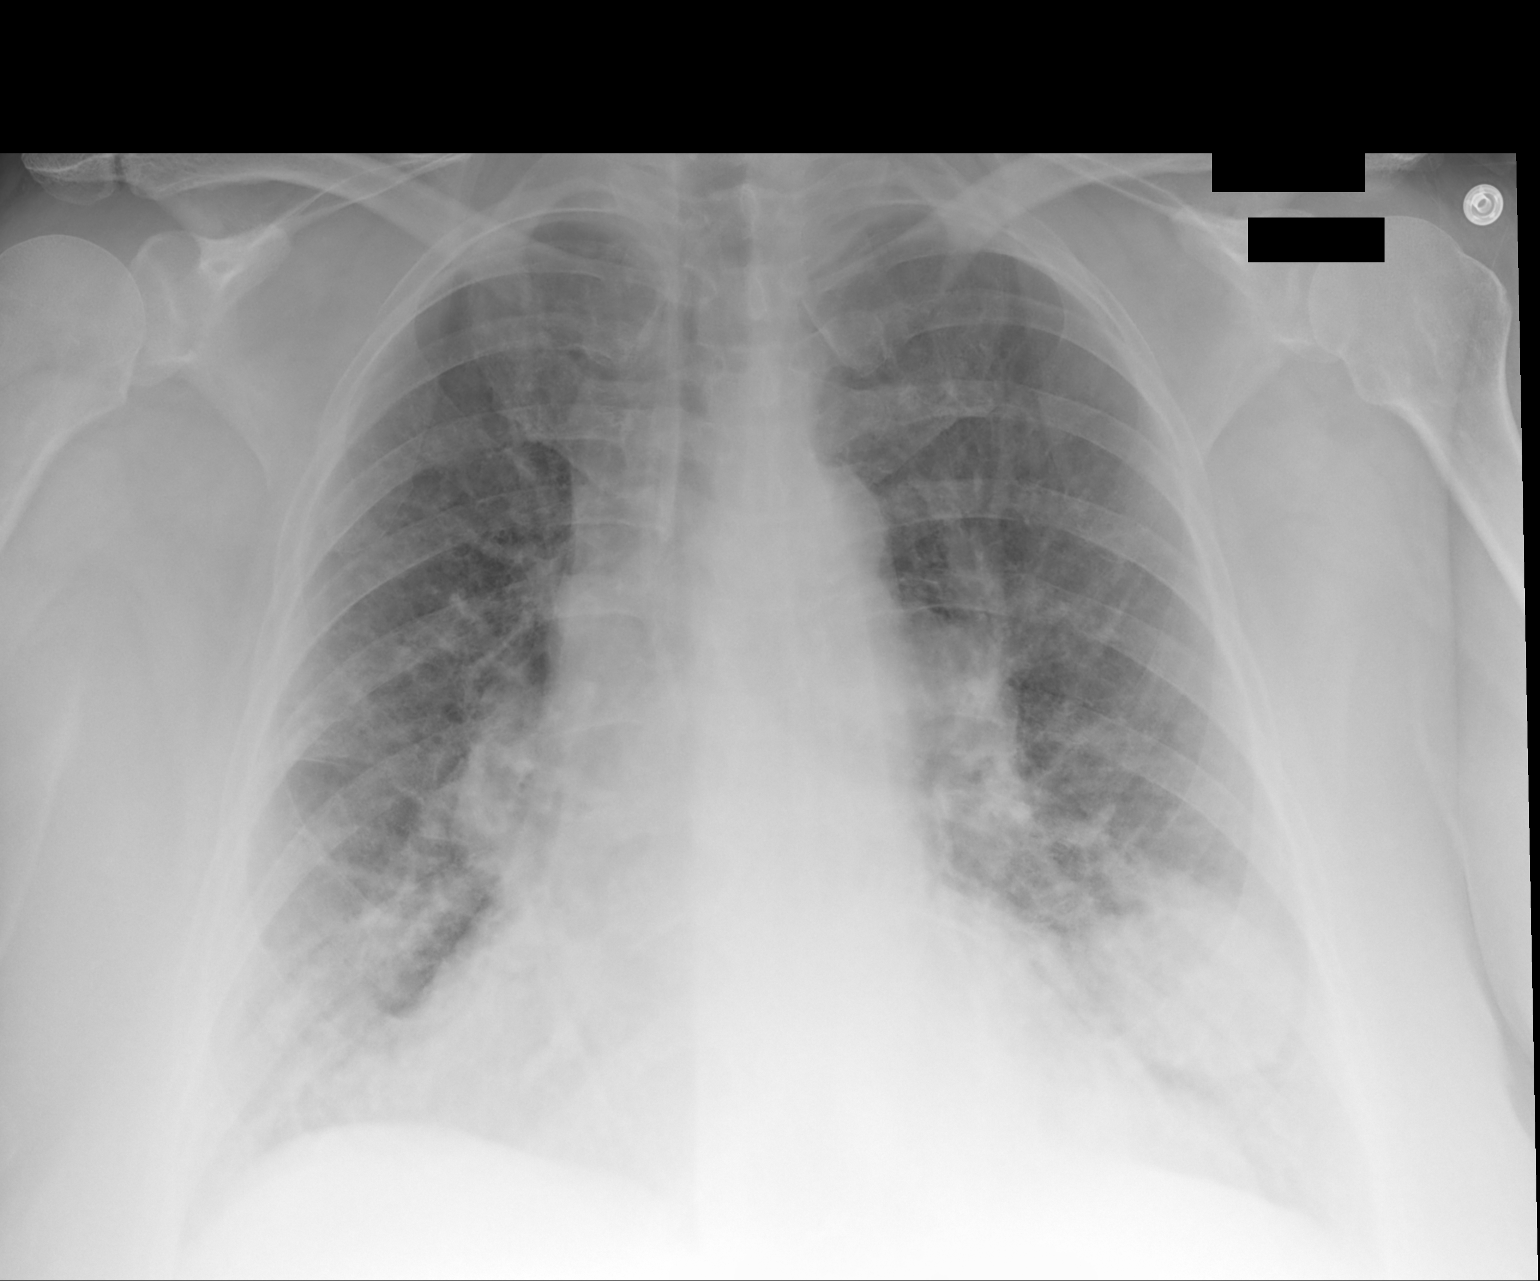

[1 of 1 positions shown; findings below may reference images not displayed]

FINDINGS: Patient has a known round mass in the left lower chest. No evidence
for a pneumothorax. Densities in lower chest probably represent
overlying soft tissues or atelectasis. Heart size is upper limits of
normal but unchanged.
IMPRESSION: Negative for pneumothorax following the left lung lesion biopsy.

## 2017-06-24 IMAGING — CT CT BIOPSY
1 series · 14 of 16 positions shown, 20 images · non-contrast
Comparison: none

CLINICAL DATA: Hypermetabolic left lower lobe lung lesion

[Series 5: (hospital) 6.0 b30f · axial · 1.09mm/px · z∈[+1181,+1186]mm · 14 of 16 slices shown, 20 images]
[im 2/16  soft-tissue]
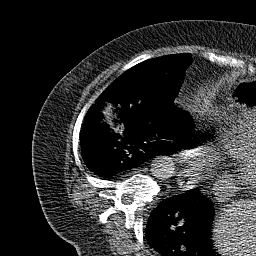
[im 2/16  lung]
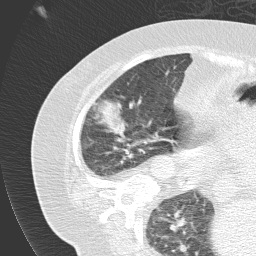
[im 2/16  bone]
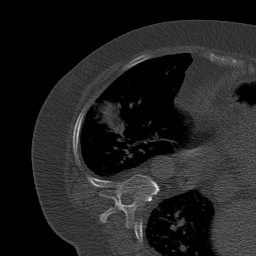
[im 3/16  soft-tissue]
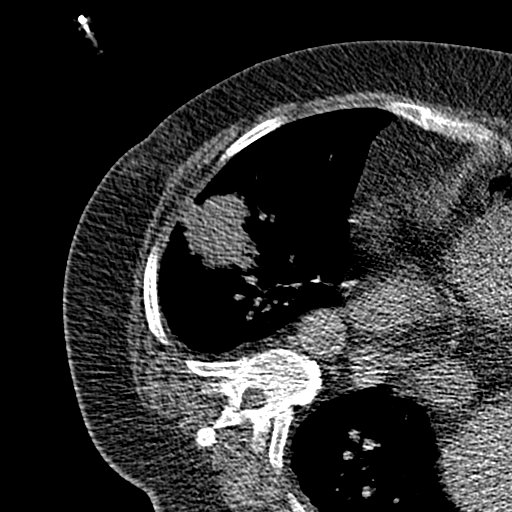
[im 4/16  soft-tissue]
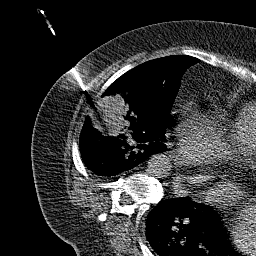
[im 5/16  soft-tissue]
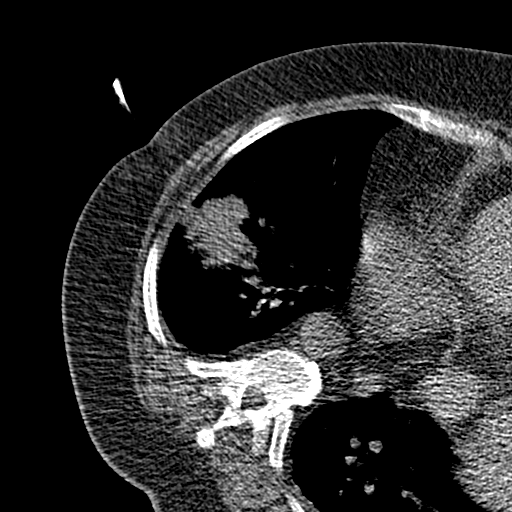
[im 6/16  soft-tissue]
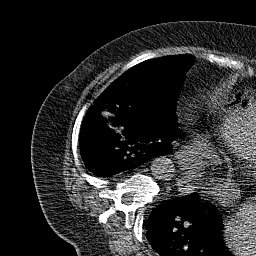
[im 7/16  soft-tissue]
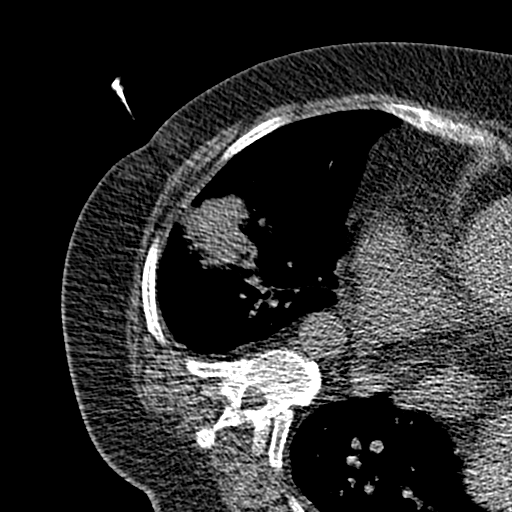
[im 8/16  soft-tissue]
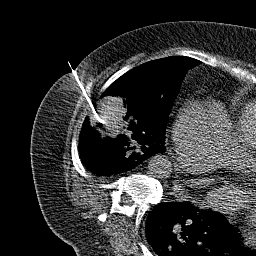
[im 9/16  soft-tissue]
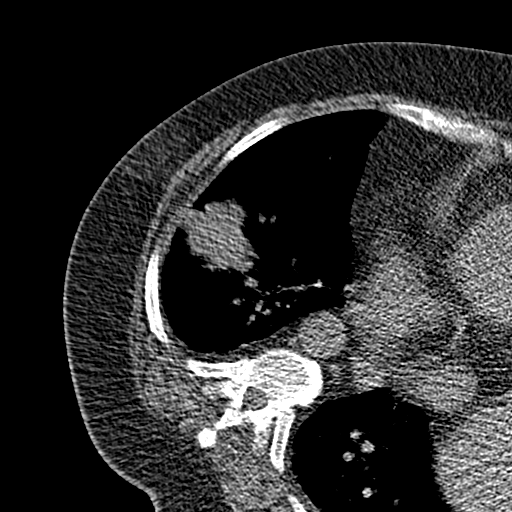
[im 10/16  soft-tissue]
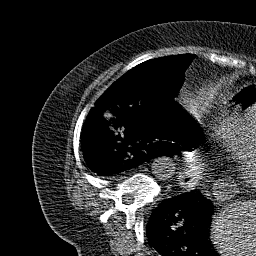
[im 10/16  bone]
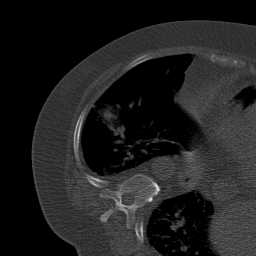
[im 11/16  soft-tissue]
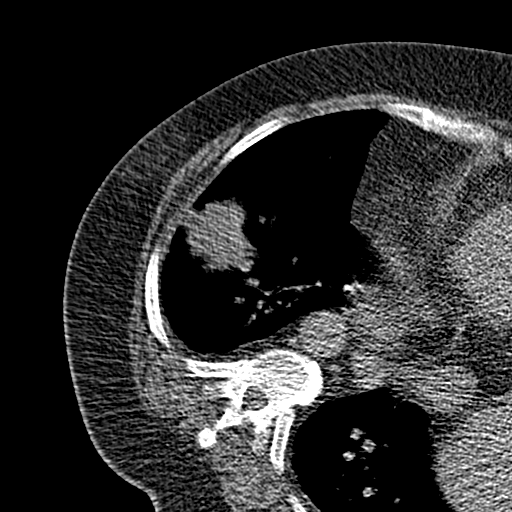
[im 12/16  soft-tissue]
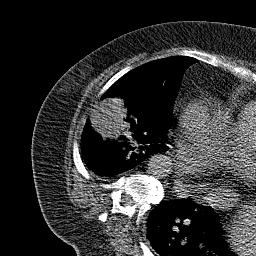
[im 13/16  soft-tissue]
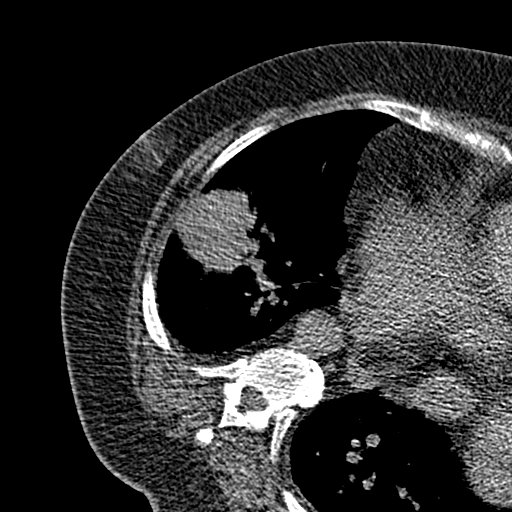
[im 13/16  lung]
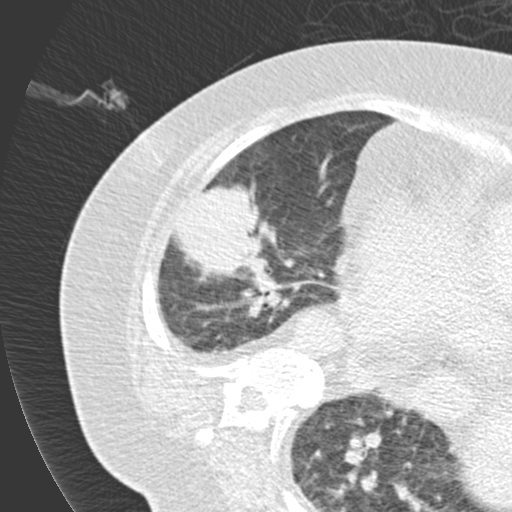
[im 14/16  soft-tissue]
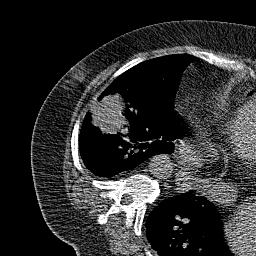
[im 14/16  lung]
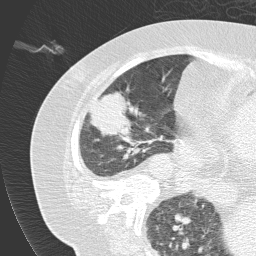
[im 15/16  soft-tissue]
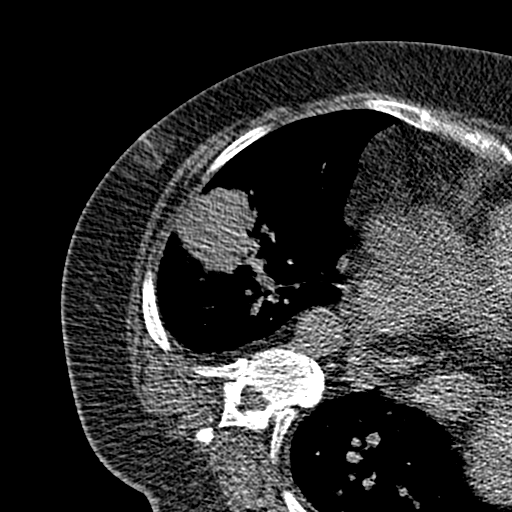
[im 15/16  lung]
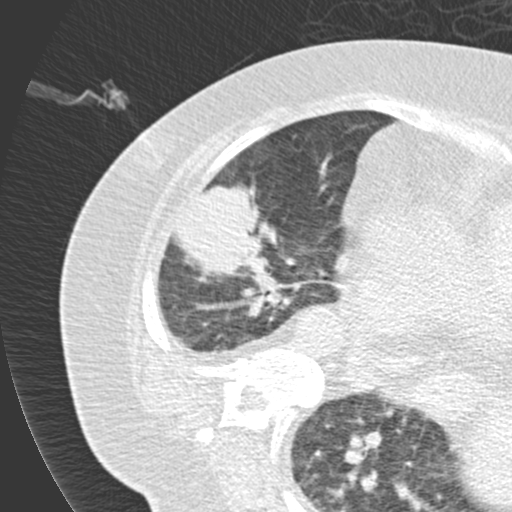

[14 of 16 positions shown; findings below may reference images not displayed]

EXAM:
CT GUIDED CORE BIOPSY OF LEFT LOWER LOBE LUNG LESION

ANESTHESIA/SEDATION:
Intravenous Fentanyl and Versed were administered as conscious
sedation during continuous cardiorespiratory monitoring by the
radiology RN, with a total moderate sedation time of 4 minutes.

PROCEDURE:
The procedure risks, benefits, and alternatives were explained to
the patient. Questions regarding the procedure were encouraged and
answered. The patient understands and consents to the procedure.

Select axial scans through the thorax were obtained. The lesion was
localized and an appropriate skin entry site was determined and
marked.

The operative field was prepped with Betadinein a sterile fashion,
and a sterile drape was applied covering the operative field. A
sterile gown and sterile gloves were used for the procedure. Local
anesthesia was provided with 1% Lidocaine.

Under CT fluoroscopic guidance, a 17 gauge trocar needle was
advanced to the margin of the lesion. Once needle tip position was
confirmed, coaxial 18-gauge core biopsy samples were obtained,
submitted in formalin to surgical pathology. The guide needle was
removed. Postprocedure scans show no pneumothorax or significant
regional alveolar hemorrhage. The patient tolerated the procedure
well.

COMPLICATIONS:
None immediate
FINDINGS: Left lower lobe lung mass was again localized. CT-guided core biopsy
samples were obtained without evident complication.
IMPRESSION: 1. Technically successful CT-guided left lower lobe lung lesion core
biopsy.

## 2017-07-17 IMAGING — CT CT HEAD WO/W CM
1 of 2 series · 13 of 30 positions shown, 17 images · IV contrast (OMNIPAQUE)
Comparison: Noncontrast head CT 06/30/2013

CLINICAL DATA: Newly diagnosed left lower lobe non-small-cell lung
cancer. Chronic headaches. Staging.

EXAM:
CT HEAD WITHOUT AND WITH CONTRAST
TECHNIQUE: Contiguous axial images were obtained from the base of the skull
through the vertex without and with intravenous contrast
CONTRAST:  100mL OMNIPAQUE IOHEXOL 300 MG/ML  SOLN

[Series 2: head w/o 4.8 h45s · axial · non-contrast · 0.43mm/px · z∈[-140,-24]mm · 13 of 30 slices shown, 17 images]
[im 3/30  brain]
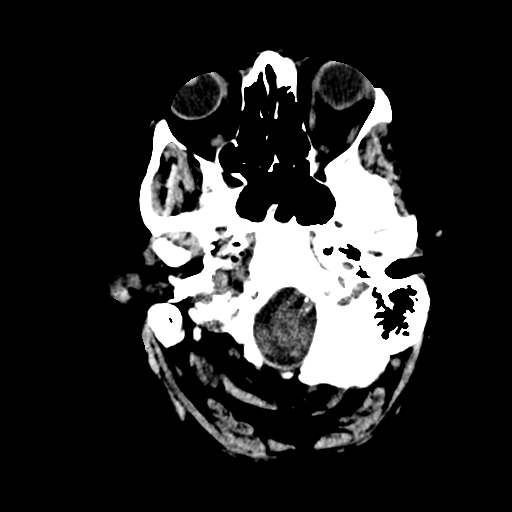
[im 3/30  bone]
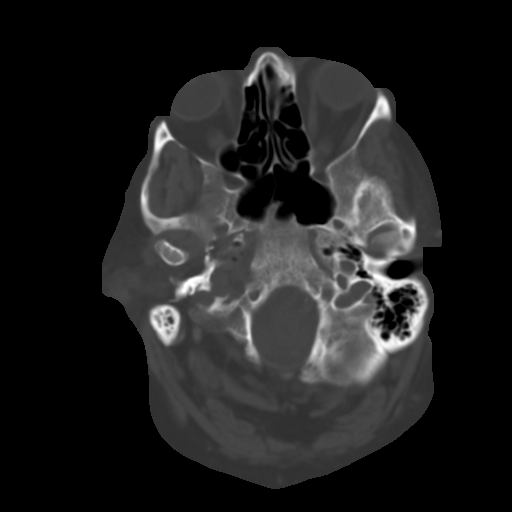
[im 5/30  brain]
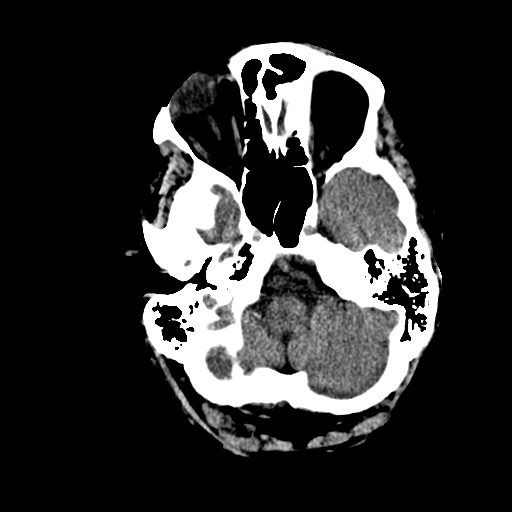
[im 7/30  brain]
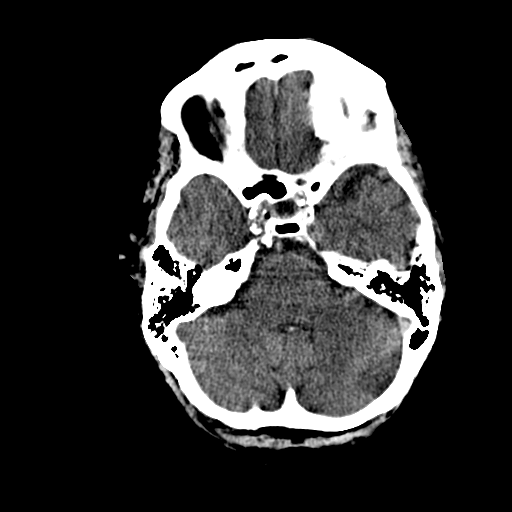
[im 9/30  brain]
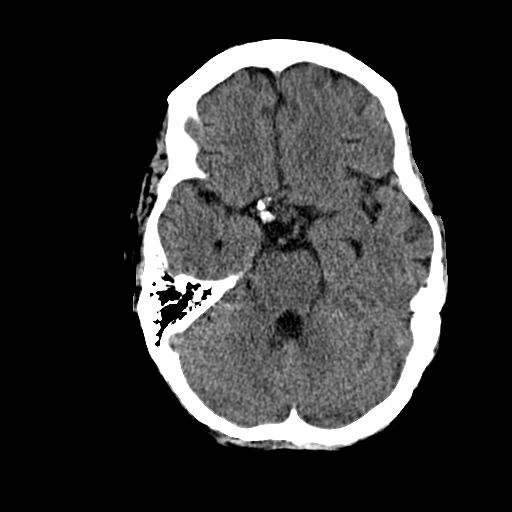
[im 11/30  brain]
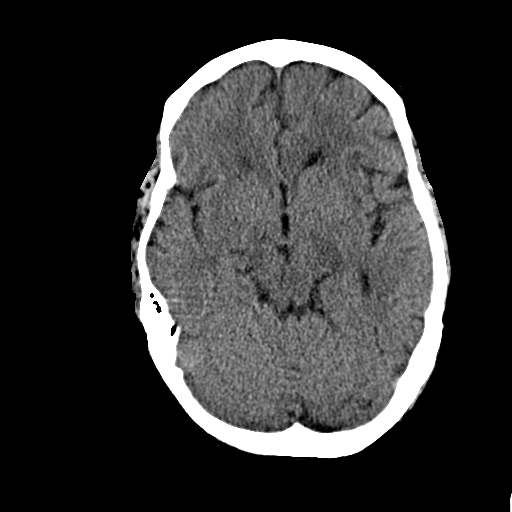
[im 11/30  bone]
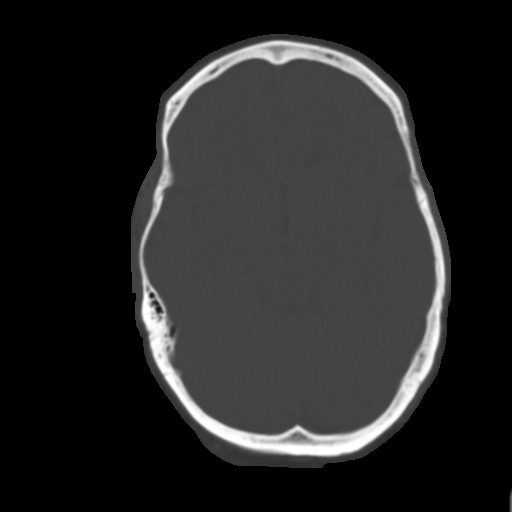
[im 13/30  brain]
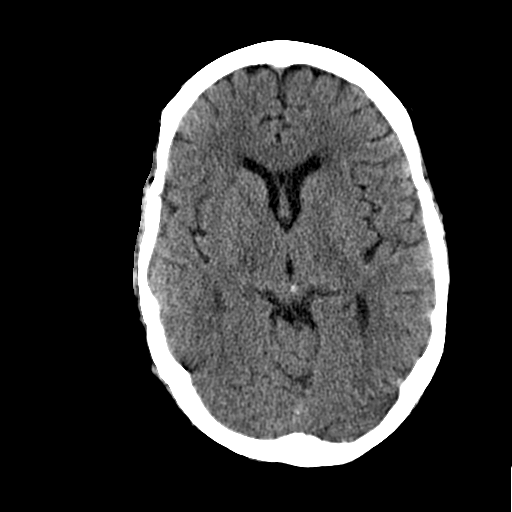
[im 15/30  brain]
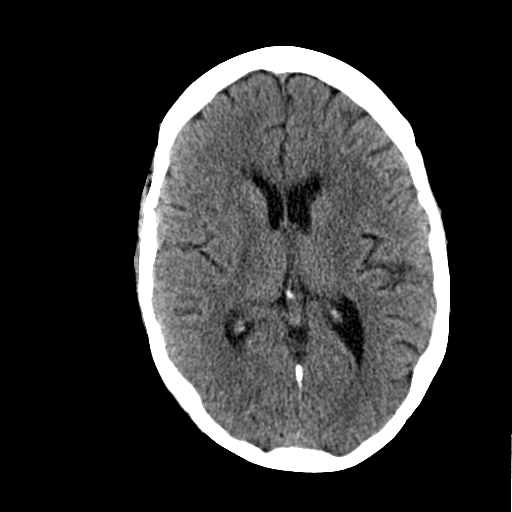
[im 17/30  brain]
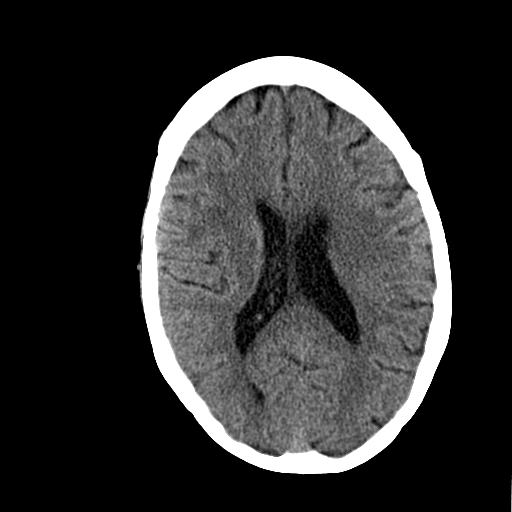
[im 19/30  brain]
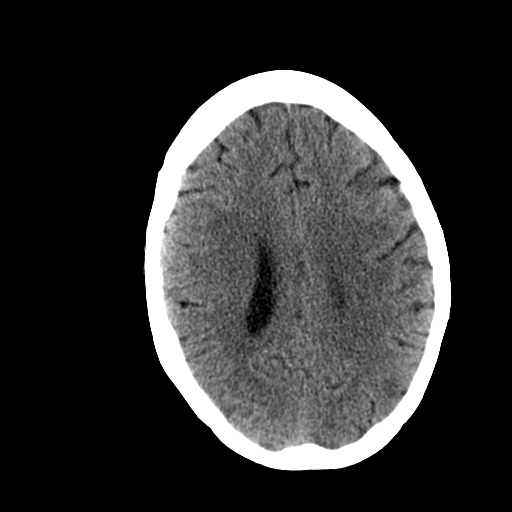
[im 19/30  bone]
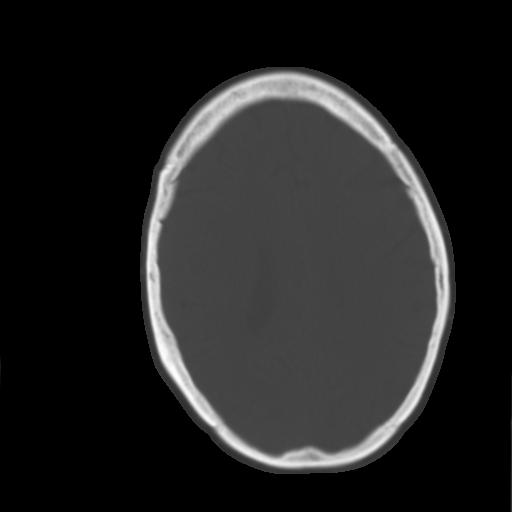
[im 21/30  brain]
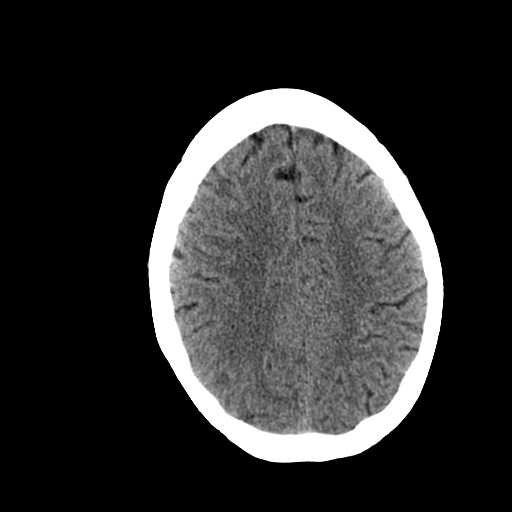
[im 23/30  brain]
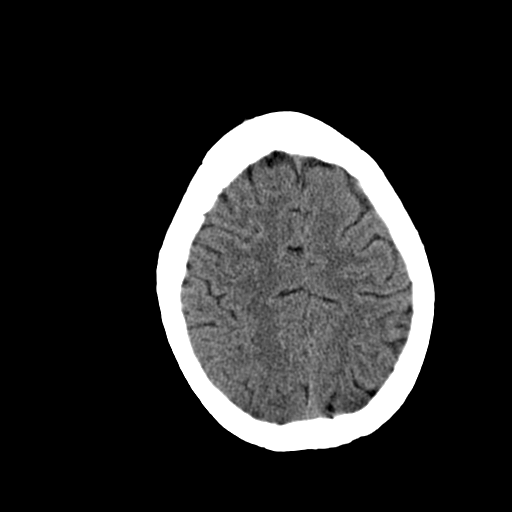
[im 25/30  brain]
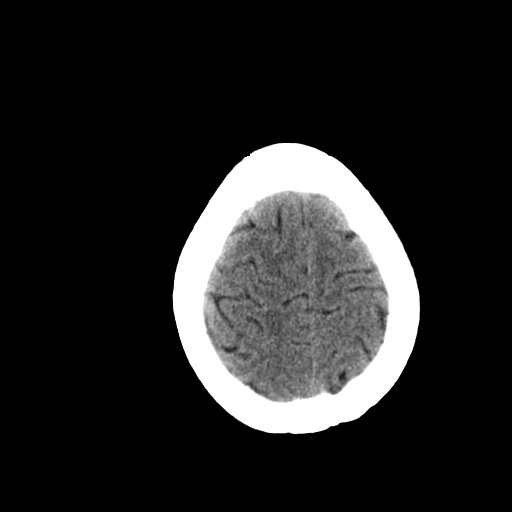
[im 27/30  brain]
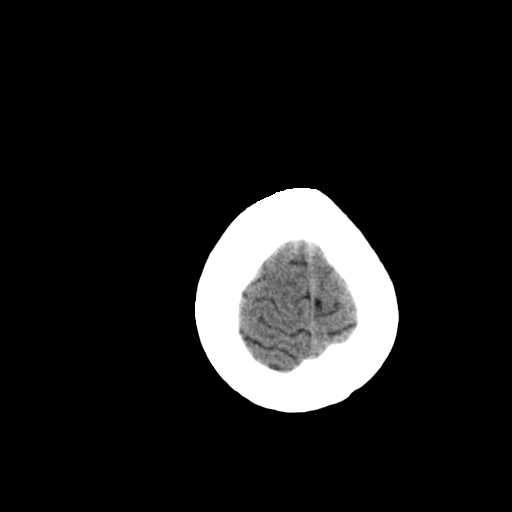
[im 27/30  bone]
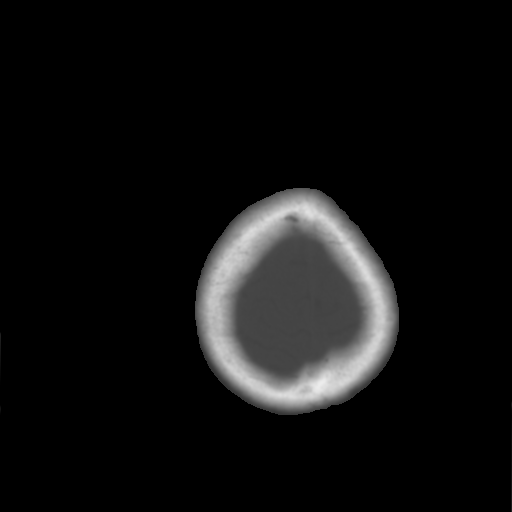

[13 of 30 positions shown; findings below may reference images not displayed]

FINDINGS: There is no evidence of acute cortical infarct, intracranial
hemorrhage, mass, midline shift, or extra-axial fluid collection.
Ventricles and sulci are normal. Scattered areas of hypoattenuation
in the subcortical and deep cerebral white matter bilaterally have
mildly progressed from the prior study and are nonspecific but
compatible with mild chronic small vessel ischemic disease. No
abnormal enhancement is identified.

Visualized orbits are unremarkable. Visualized paranasal sinuses and
mastoid air cells are clear. No destructive skull lesion is seen.
There is mild-to-moderate calcified atherosclerosis at the skull
base.
IMPRESSION: 1. No evidence of intracranial metastases.
2. Mild chronic small vessel ischemic disease.

## 2017-07-21 IMAGING — CR DG CHEST 1V PORT
1 series · 1 of 1 positions shown · non-contrast
Comparison: Chest x-ray 12/12/2014

CLINICAL DATA: Status post bronchoscopy and left lung biopsy.

EXAM:
PORTABLE CHEST - 1 VIEW

[AP]
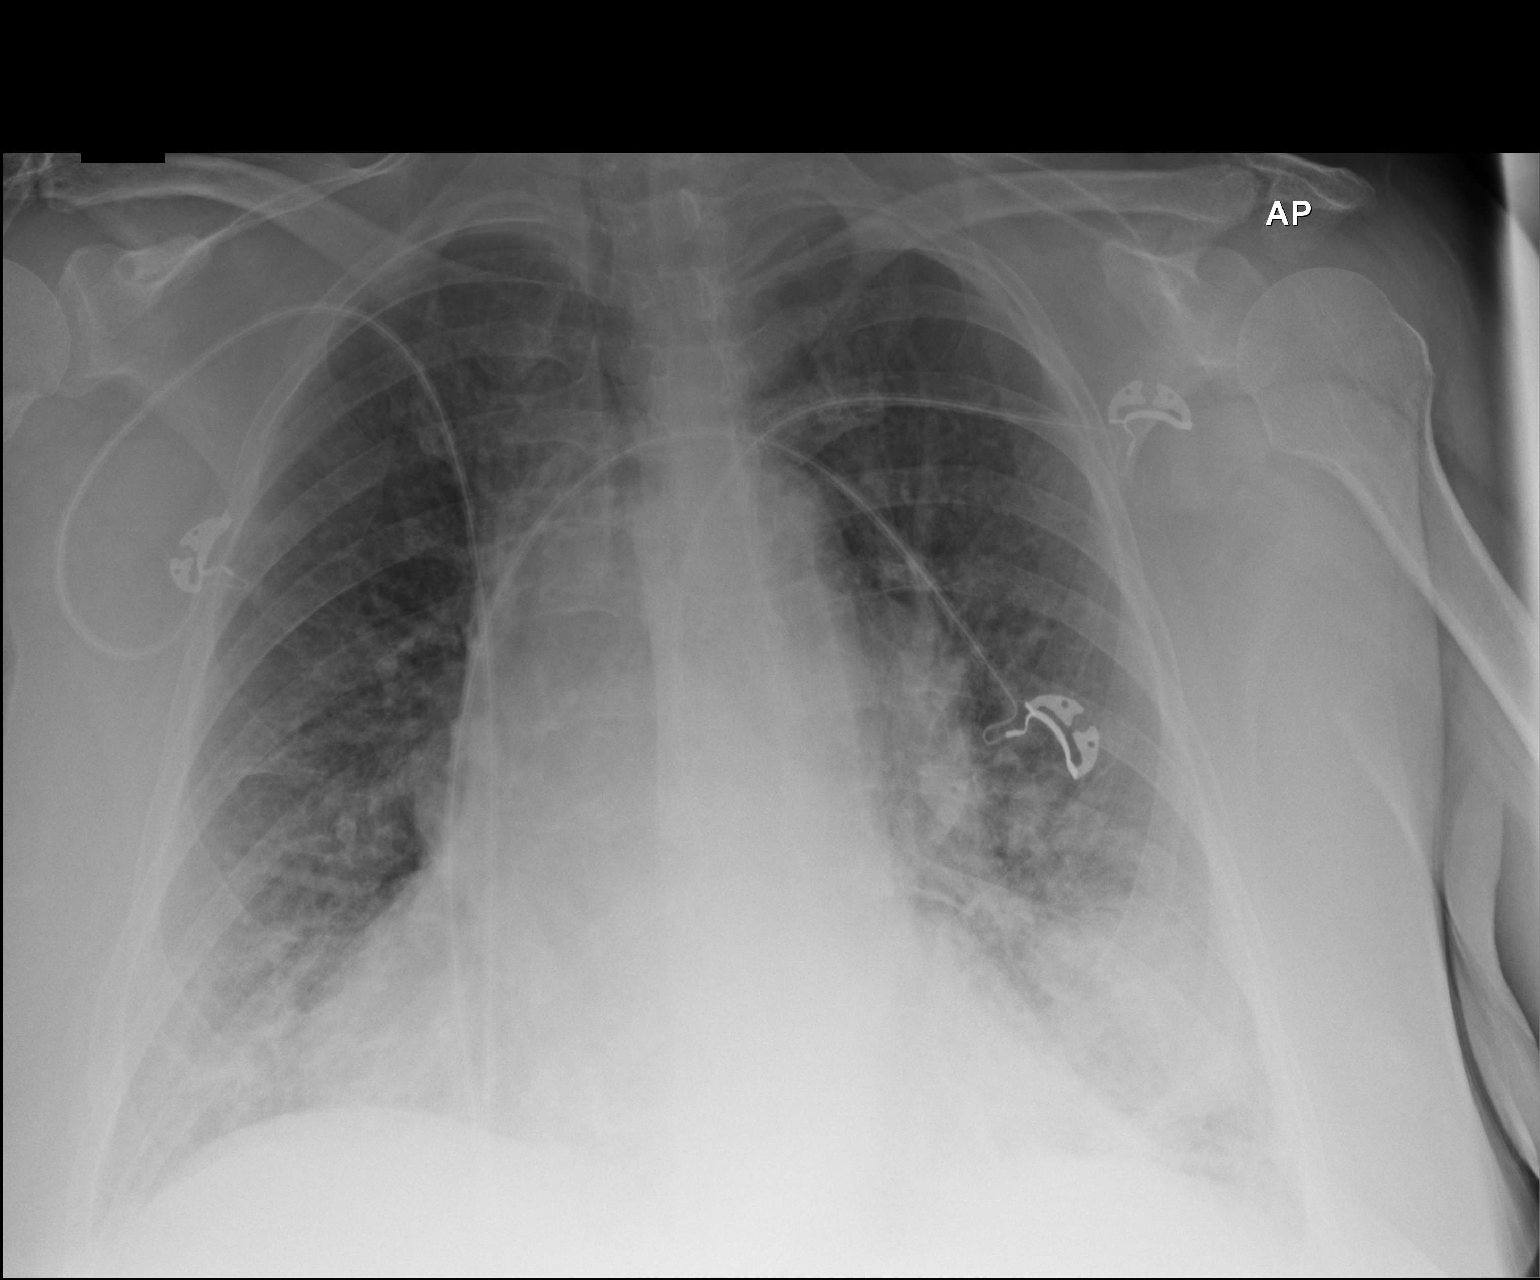

[1 of 1 positions shown; findings below may reference images not displayed]

FINDINGS: The heart is enlarged but stable. Persistent left lower lobe lung
mass. Low lung volumes with vascular crowding and atelectasis. No
postprocedural pneumothorax or pleural effusion.
IMPRESSION: No postprocedural pneumothorax or pleural effusion.

## 2017-07-26 IMAGING — DX DG CHEST 1V PORT
1 series · 1 of 1 positions shown · non-contrast
Comparison: 12/15/2014

CLINICAL DATA: Shortness of breath, fever, cough

EXAM:
PORTABLE CHEST - 1 VIEW

[chest ap]
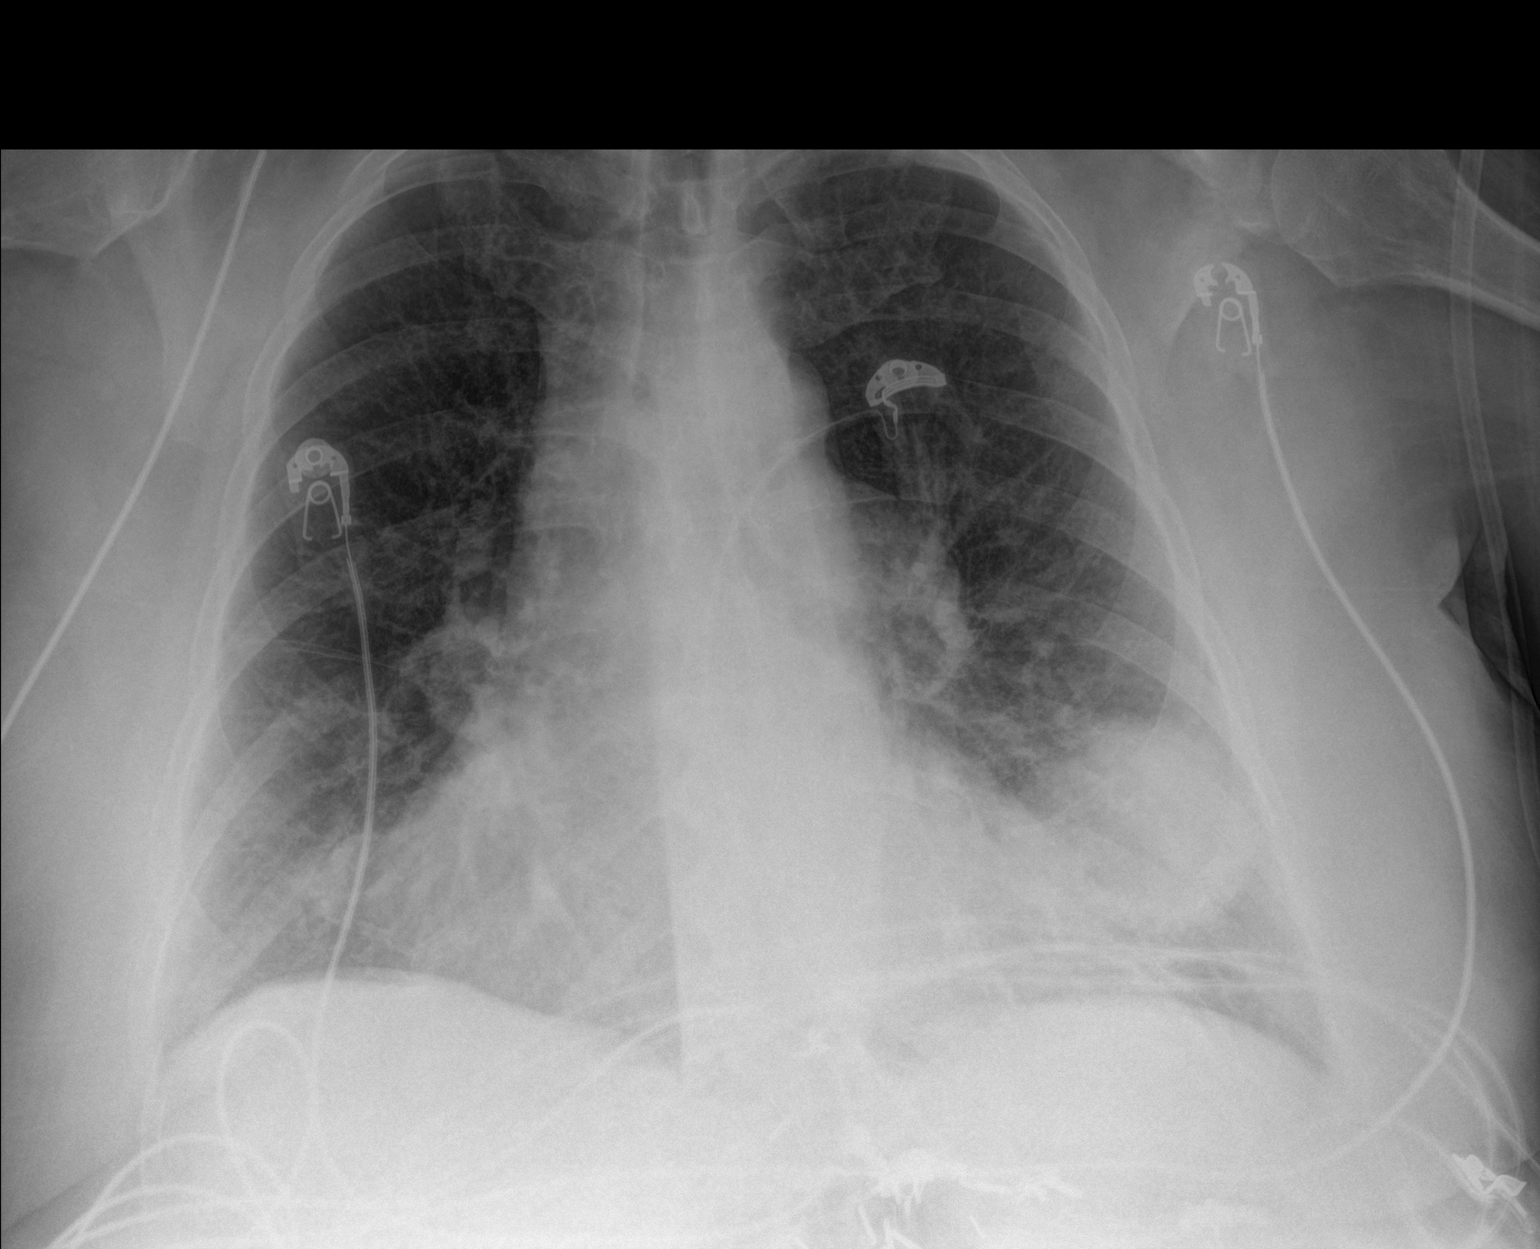

[1 of 1 positions shown; findings below may reference images not displayed]

FINDINGS: Cardiomediastinal silhouette is stable. Nodular mass in left lower
lobe again noted. No acute infiltrate or pulmonary edema. Bony
thorax is stable.
IMPRESSION: No active disease.  Again noted nodular mass in left lower lobe.

## 2017-07-28 IMAGING — DX DG CHEST 2V
2 series · 2 of 2 positions shown · non-contrast
Comparison: 12/20/2014

CLINICAL DATA: Fever. Shortness of breath. Left lower lobe lung
mass.

EXAM:
CHEST  2 VIEW

[w chest pa]
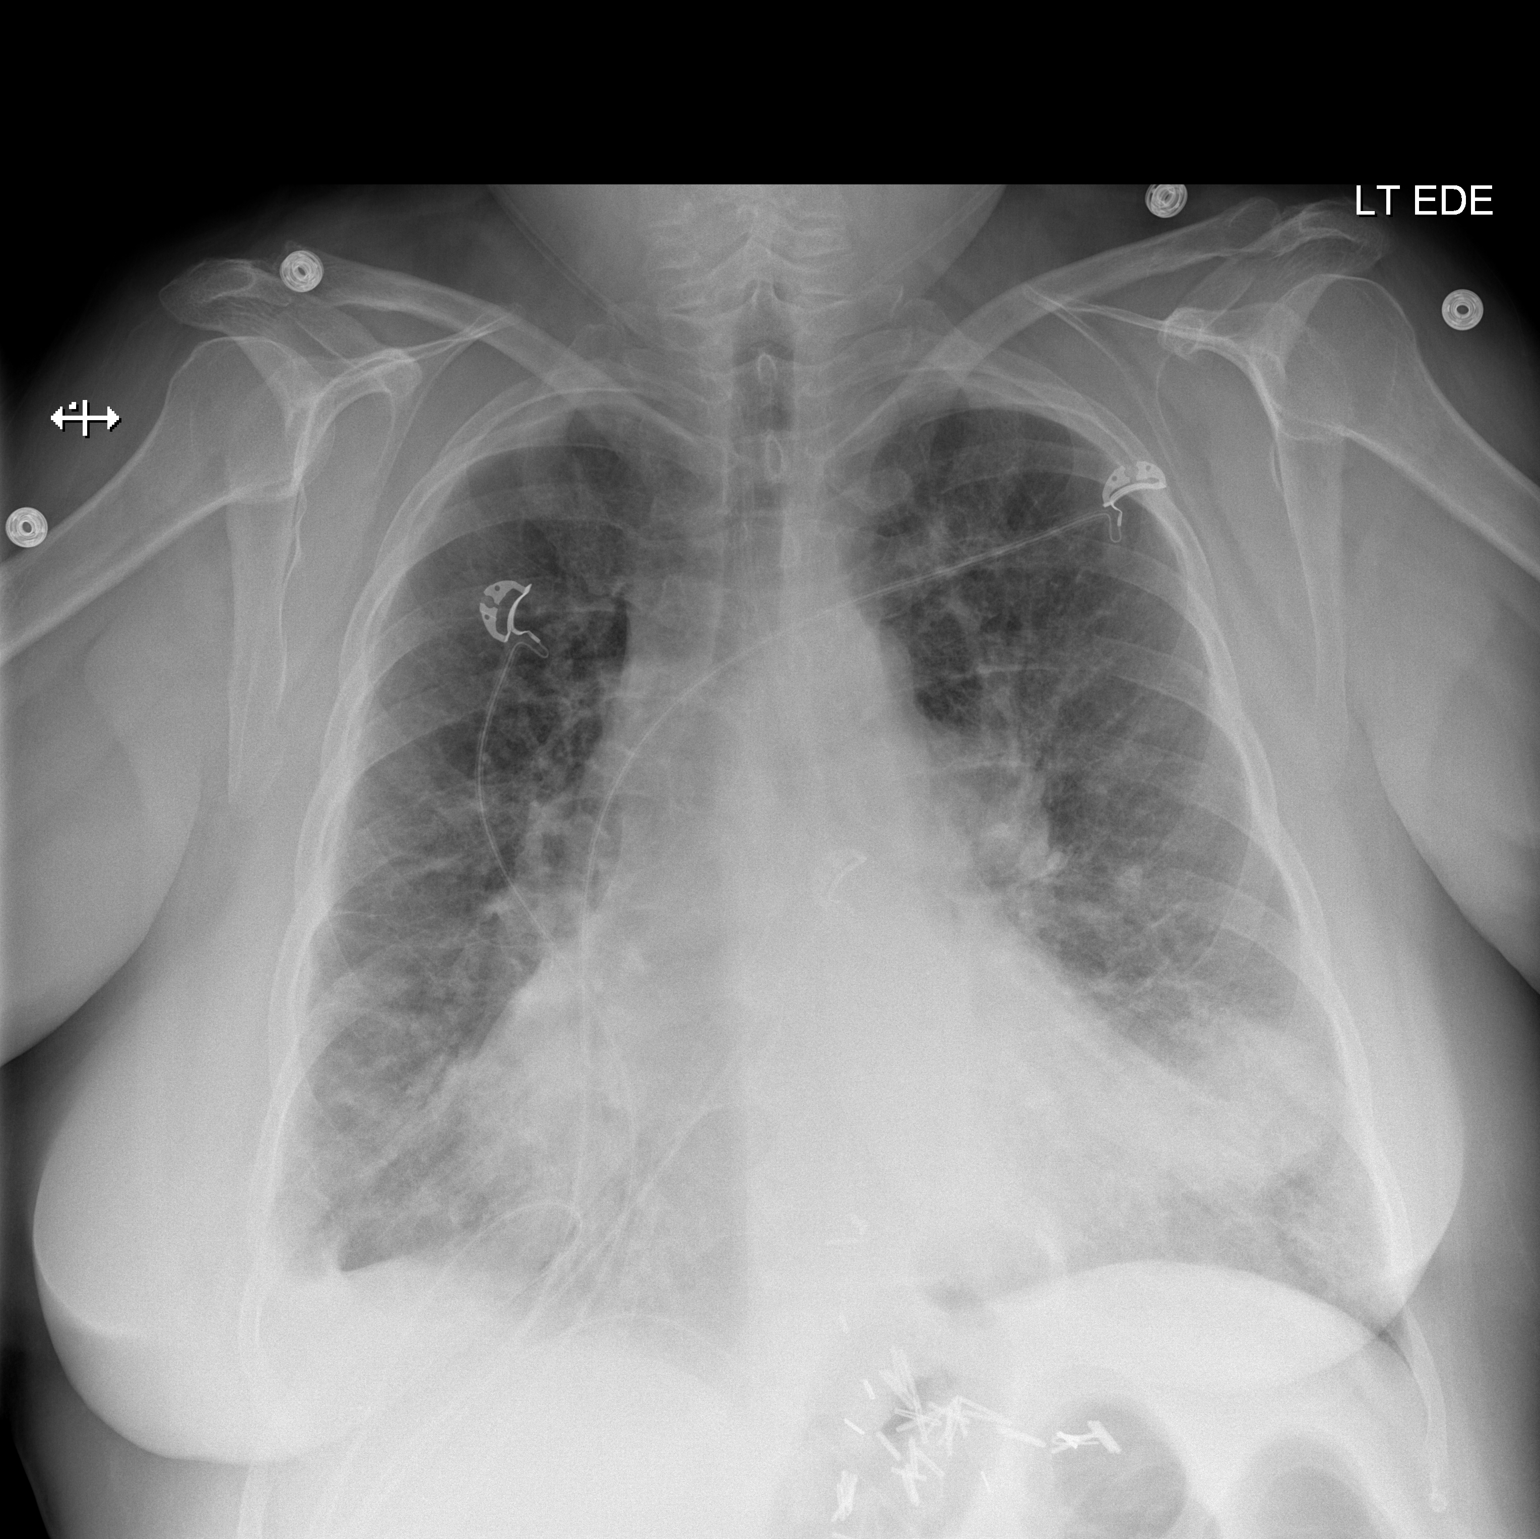

[w chest lat]
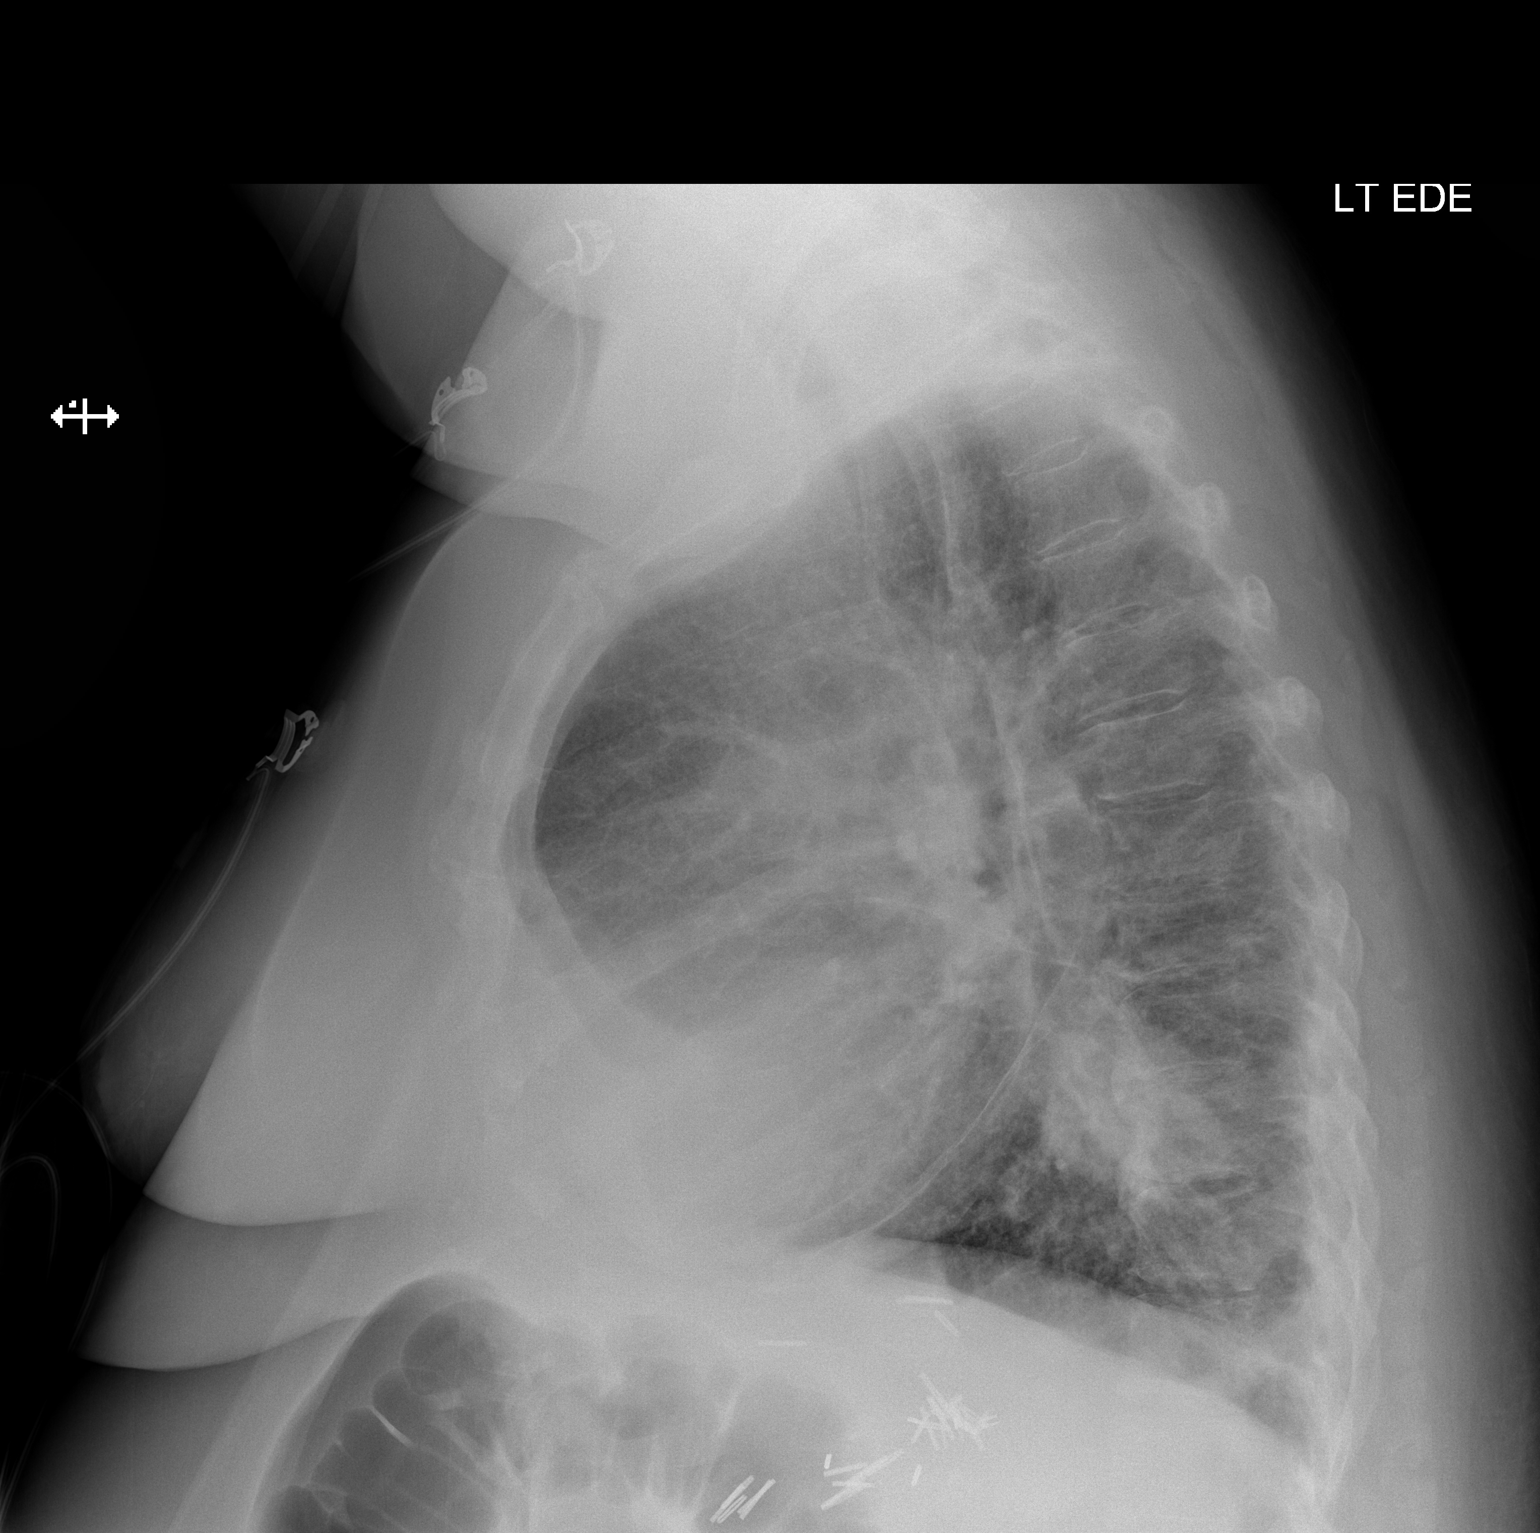

[2 of 2 positions shown; findings below may reference images not displayed]

FINDINGS: Cardiomegaly remains stable. Changes of COPD again noted. 5 cm left
lower lobe mass again demonstrated. No other areas of airspace
consolidation are seen. No evidence of pneumothorax or pleural
effusion. Numerous surgical clips again seen in the epigastric
region.
IMPRESSION: Stable 5 cm left lower lobe mass.

Stable cardiomegaly and COPD.

## 2017-08-05 IMAGING — DX DG CHEST 2V
2 series · 2 of 2 positions shown · non-contrast
Comparison: 12/22/2014

CLINICAL DATA: Chest pain for 2 days.

EXAM:
CHEST  2 VIEW

[chest lat]
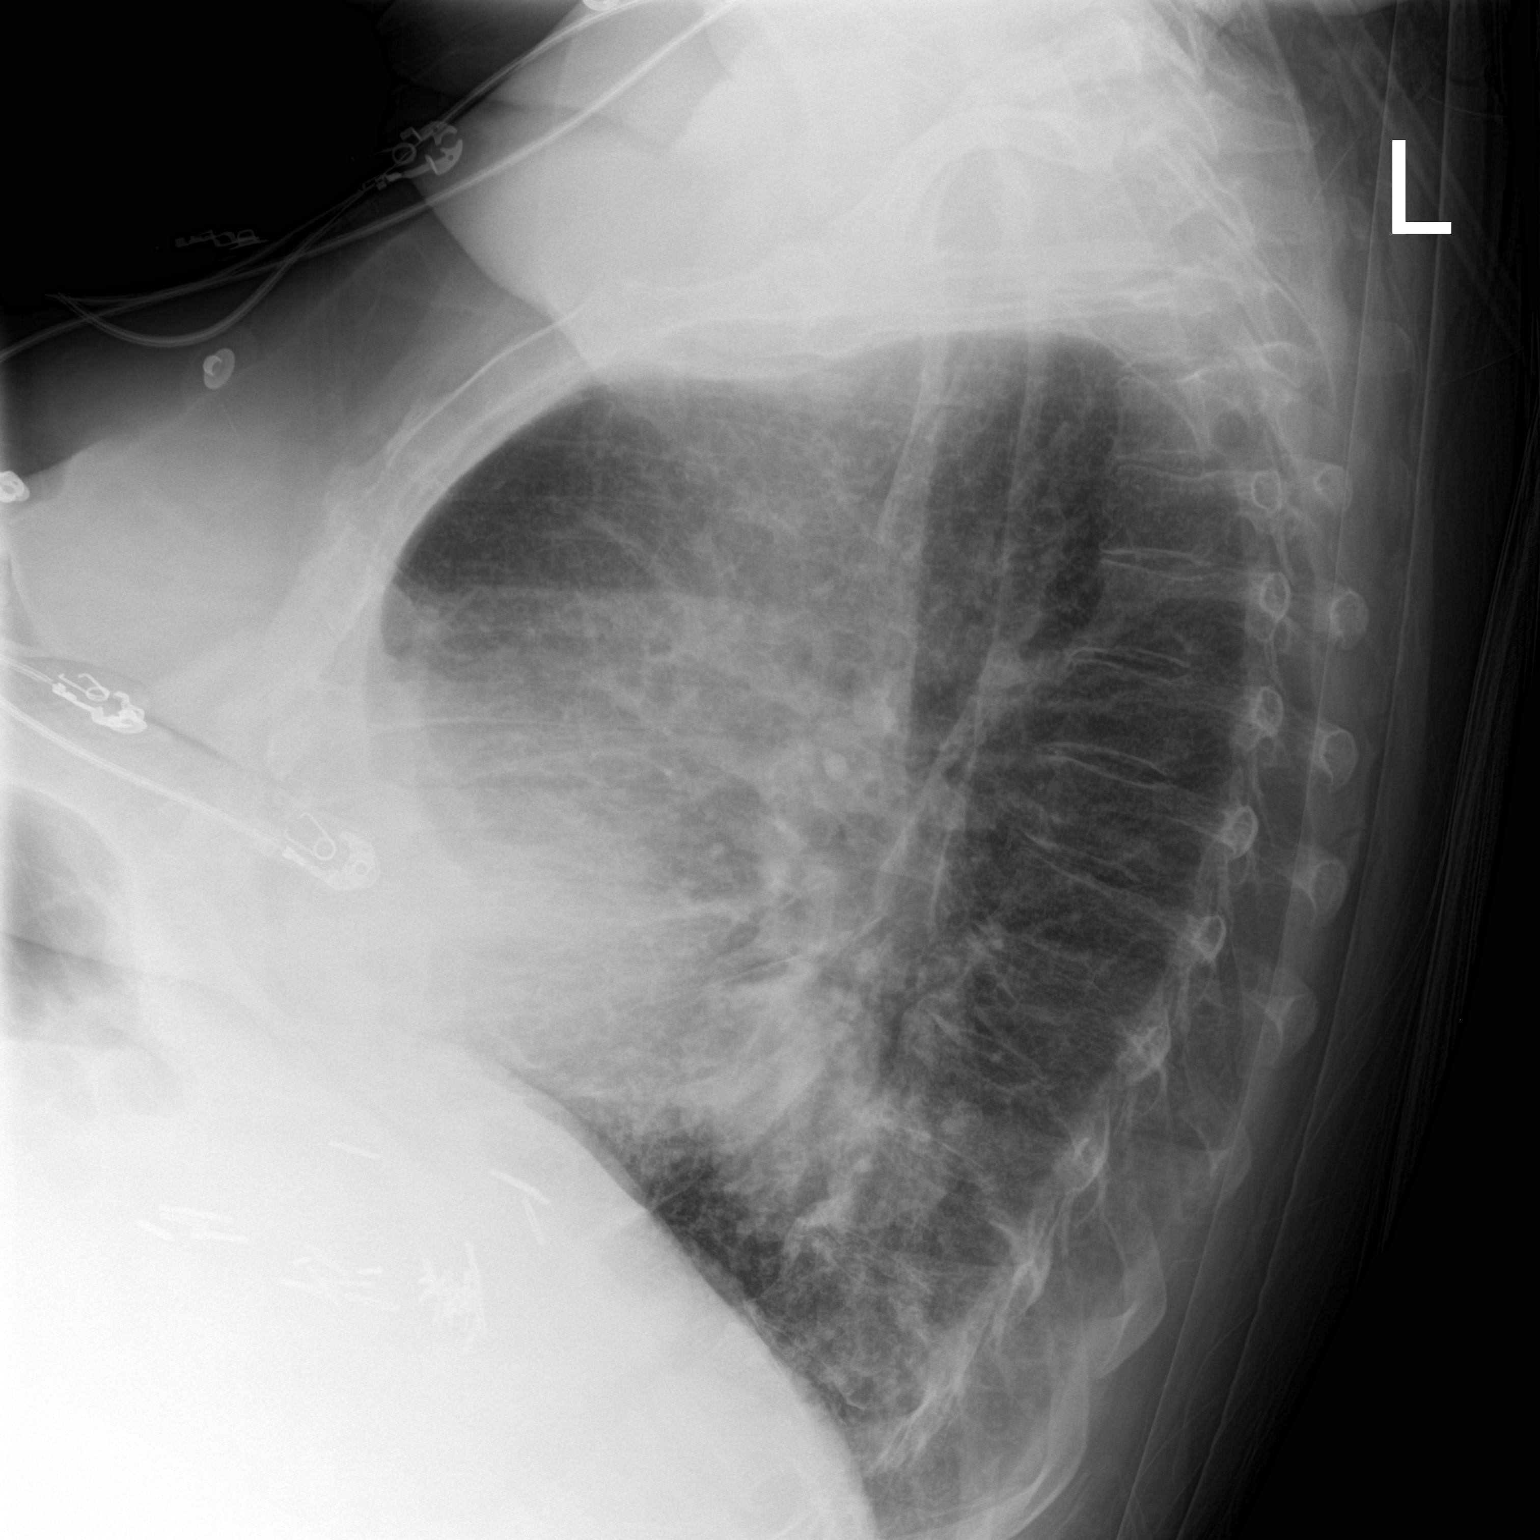

[chest ap]
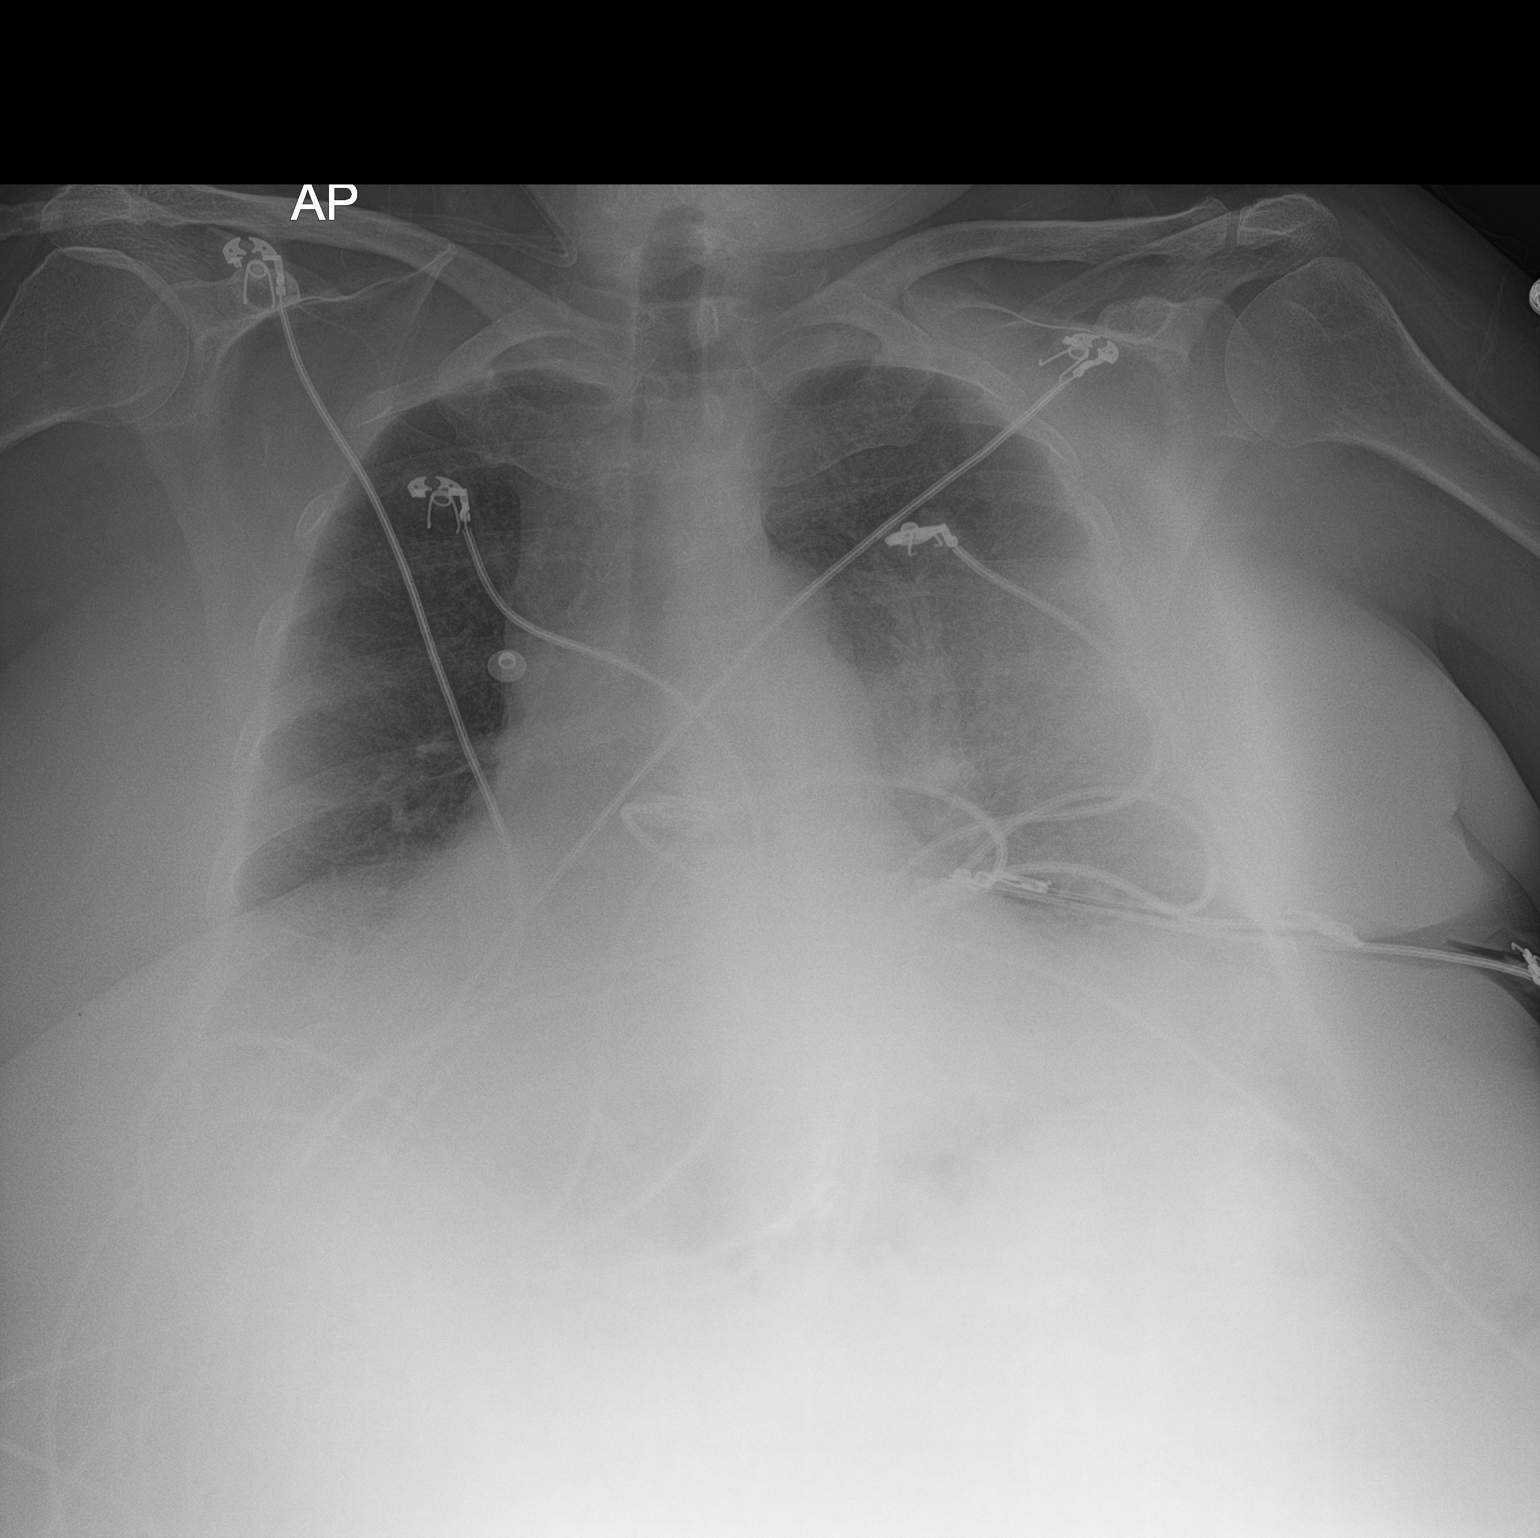

[2 of 2 positions shown; findings below may reference images not displayed]

FINDINGS: Trace bilateral pleural effusions. Bilateral interstitial
thickening. Persistent left lower lobe lung mass. No pneumothorax.
Stable cardiomegaly. No acute osseous abnormality.
IMPRESSION: 1. Cardiomegaly with trace bilateral pleural effusions and bilateral
interstitial prominence concerning for mild CHF.
2. Persistent left lower lobe lung mass.

## 2017-08-05 IMAGING — CT CT ANGIO CHEST
1 of 8 series · 17 of 36 positions shown · IV contrast (omnipaque)
Comparison: Chest radiograph December 30, 2014 ; chest CT October 30, 2014 ; PET-CT November 12, 2014

CLINICAL DATA: Chest pain and shortness of breath

EXAM:
CT ANGIOGRAPHY CHEST WITH CONTRAST
TECHNIQUE: Multidetector CT imaging of the chest was performed using the
standard protocol during bolus administration of intravenous
contrast. Multiplanar CT image reconstructions and MIPs were
obtained to evaluate the vascular anatomy.
CONTRAST:  100mL OMNIPAQUE IOHEXOL 350 MG/ML SOLN

[Series 407: thins pacs · axial · 0.79mm/px · z∈[-39,+237]mm · 17 of 312 slices shown]
[im 18/312  lung]
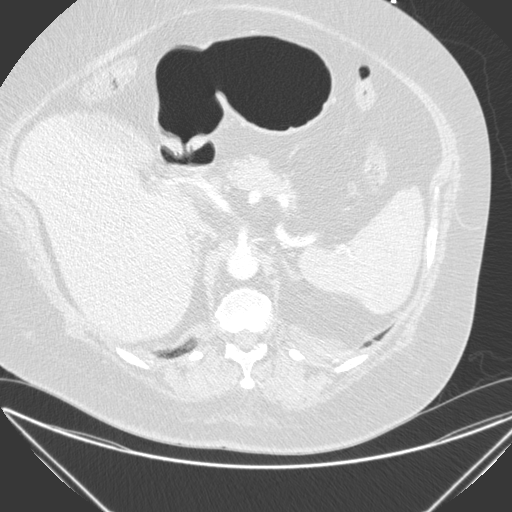
[im 35/312  mediastinal]
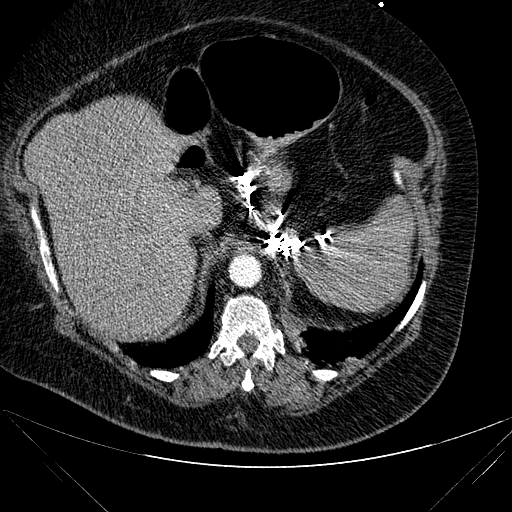
[im 52/312  lung]
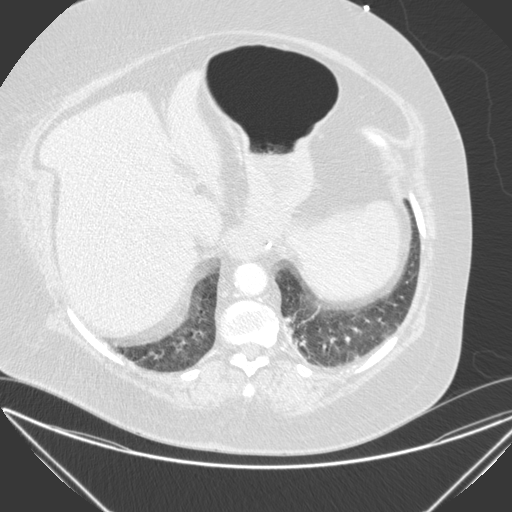
[im 70/312  mediastinal]
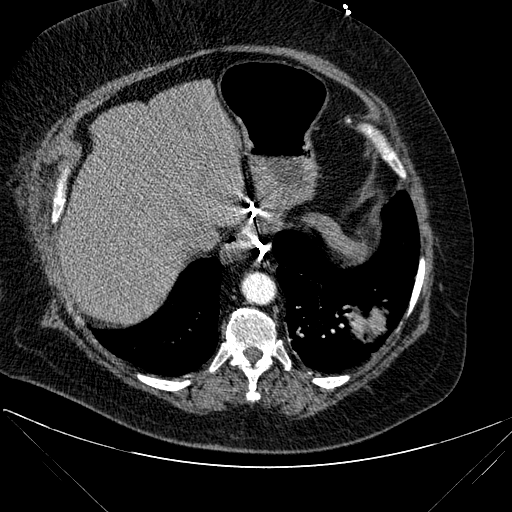
[im 87/312  lung]
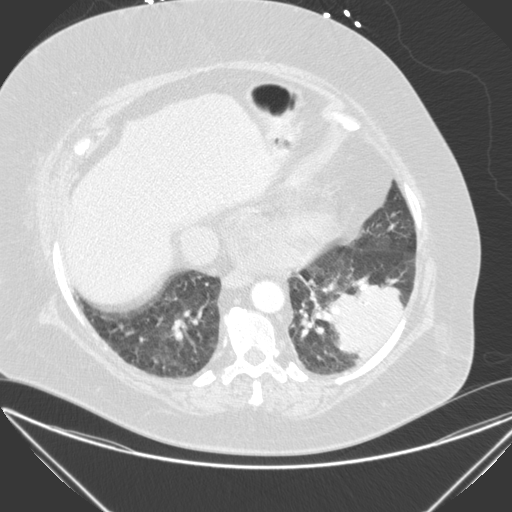
[im 104/312  mediastinal]
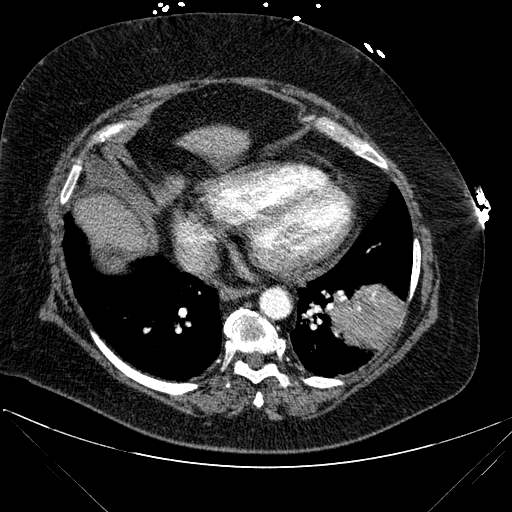
[im 121/312  lung]
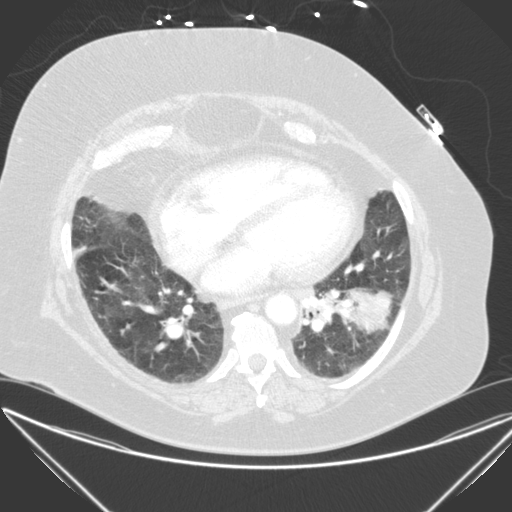
[im 139/312  mediastinal]
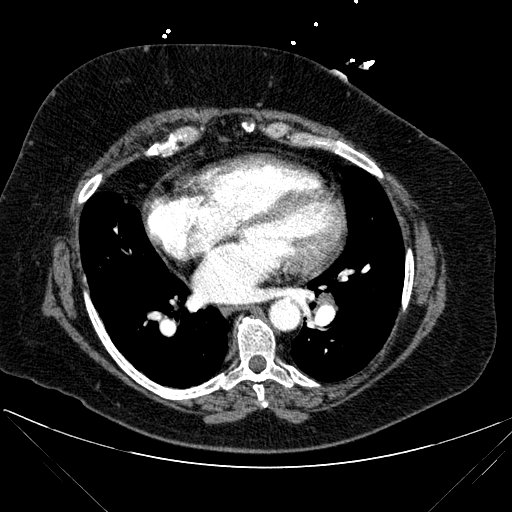
[im 156/312  lung]
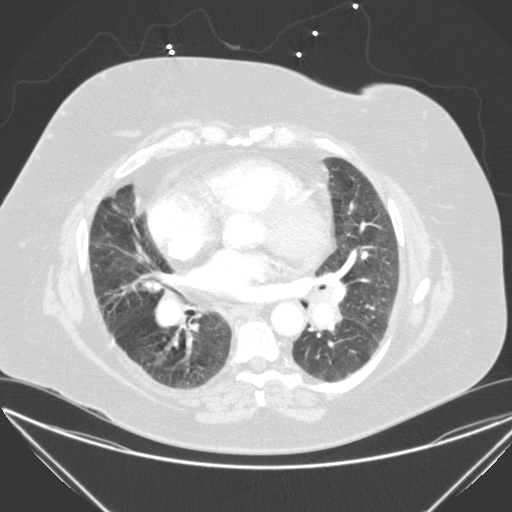
[im 173/312  mediastinal]
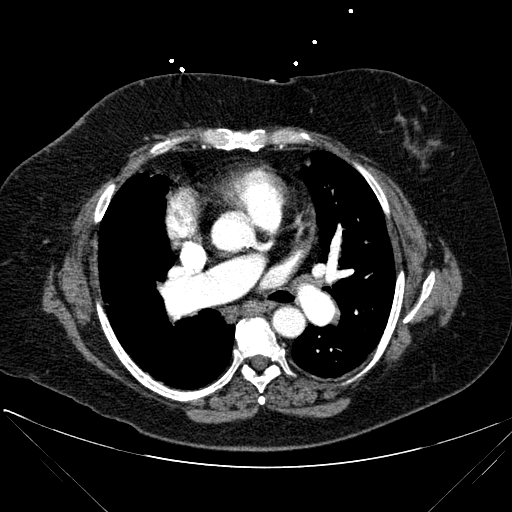
[im 191/312  lung]
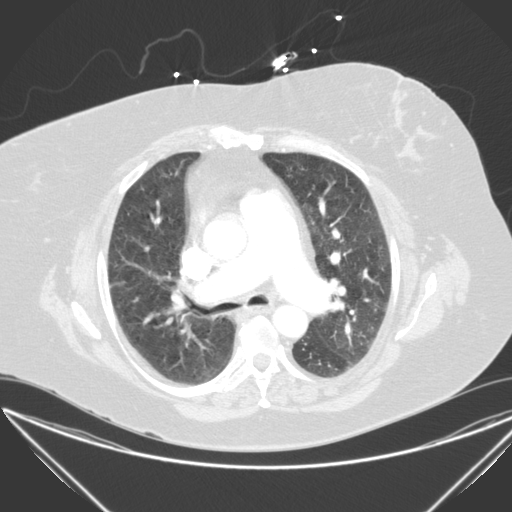
[im 208/312  mediastinal]
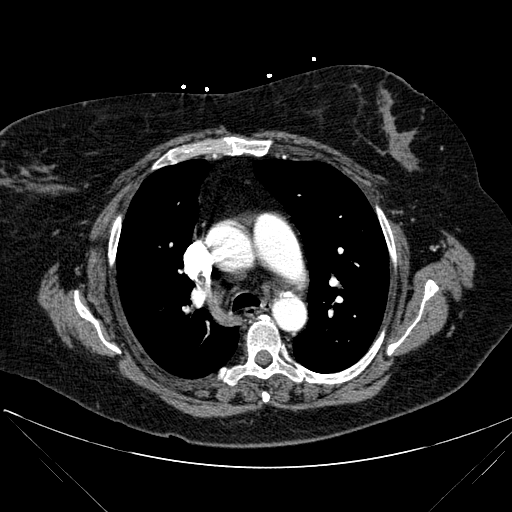
[im 225/312  lung]
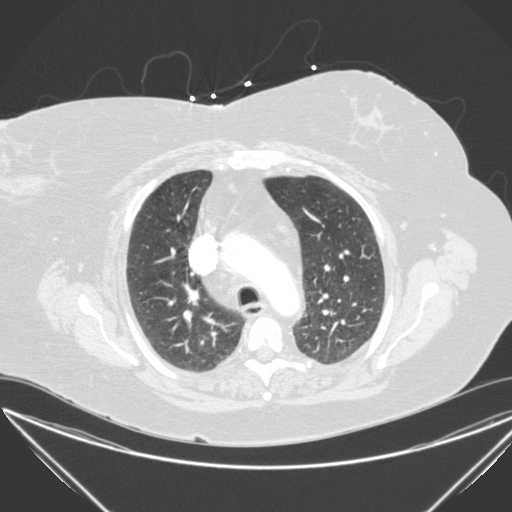
[im 242/312  mediastinal]
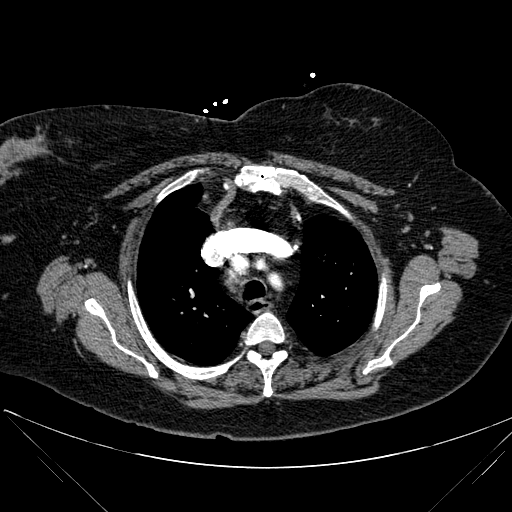
[im 260/312  lung]
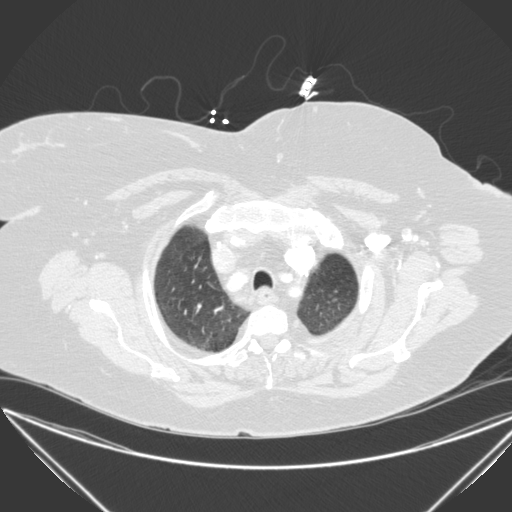
[im 277/312  mediastinal]
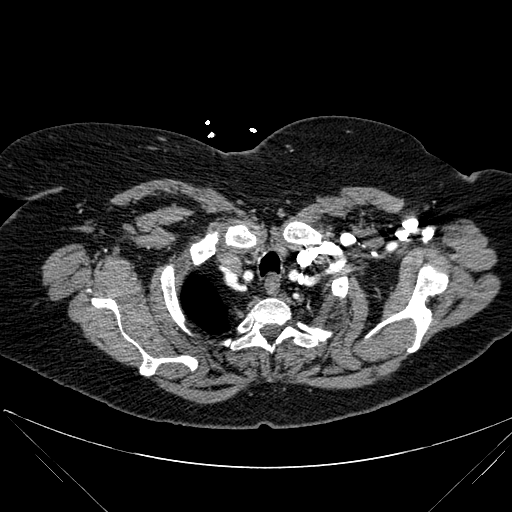
[im 294/312  lung]
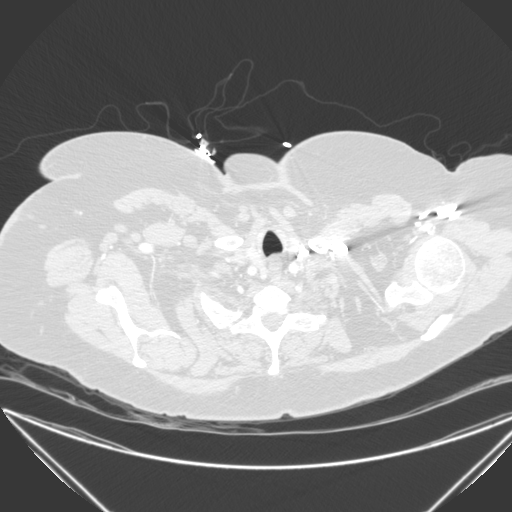

[17 of 36 positions shown; findings below may reference images not displayed]

FINDINGS: There is no demonstrable pulmonary embolus. There is atherosclerotic
change in the aorta. No aneurysm or dissection is seen. There is
mild atherosclerotic change in the visualized great vessels without
hemodynamically significant obstruction.

There is prominence of the central pulmonary arteries with
peripheral tapering suggesting pulmonary arterial hypertension.

The known left lower lobe mass now measures 6.5 x 5.8 cm, larger
than on recent PET-CT examination. This mass surrounds but does not
frankly invade several peripheral pulmonary arterial branches in the
left lower lobe.

There is no frank edema or consolidation. There is inferior
posterior left base atelectasis. There are no blastic or lytic bone
lesions.

Thyroid appears unremarkable. There are small mediastinal lymph
nodes throughout the chest. There is a lymph node in the left hilar
region measuring 1.6 x 1.3 cm.

There is mild pericardial thickening. There is a small hiatal
hernia. There is extensive calcification in the left anterior
descending coronary artery.

In the visualized upper abdomen, there are multiple surgical clips.
There is atherosclerotic change in the abdominal aorta.

There are no blastic or lytic bone lesions.

Review of the MIP images confirms the above findings.
IMPRESSION: No demonstrable pulmonary embolus.

Left lower lobe mass continues to enlarge consistent with neoplasia.
There is an enlarged left hilar lymph node.

Left base atelectasis noted.

Slight pericardial thickening.

Suspect pulmonary arterial hypertension. There is extensive left
anterior descending coronary artery calcification.

## 2017-09-02 IMAGING — CR DG CHEST 2V
3 series · 3 of 3 positions shown · non-contrast
Comparison: 12/30/2014 and previous

CLINICAL DATA: receiving radiation (earlier today) for lung cancer
and started having shortness of breathe associated with weakness,
chili[employer], shaking and coughing up clear mucous. Her O2 was at 66%

EXAM:
CHEST - 2 VIEW

[w chest lat (1 of 2)]
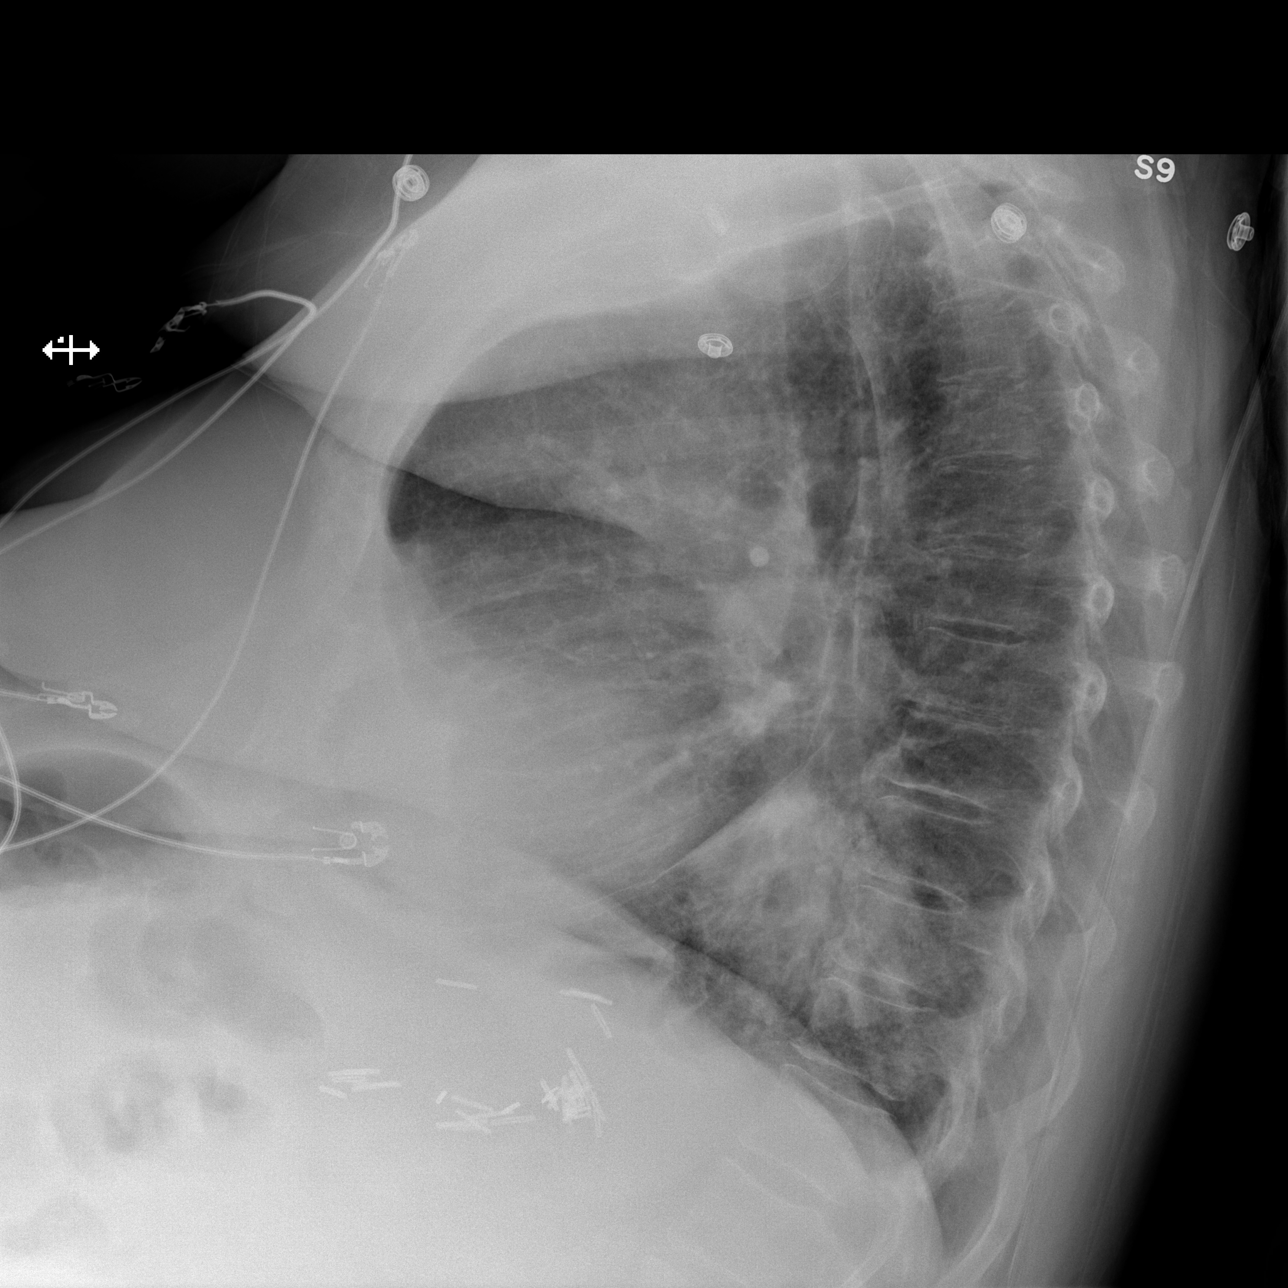

[w chest lat (2 of 2)]
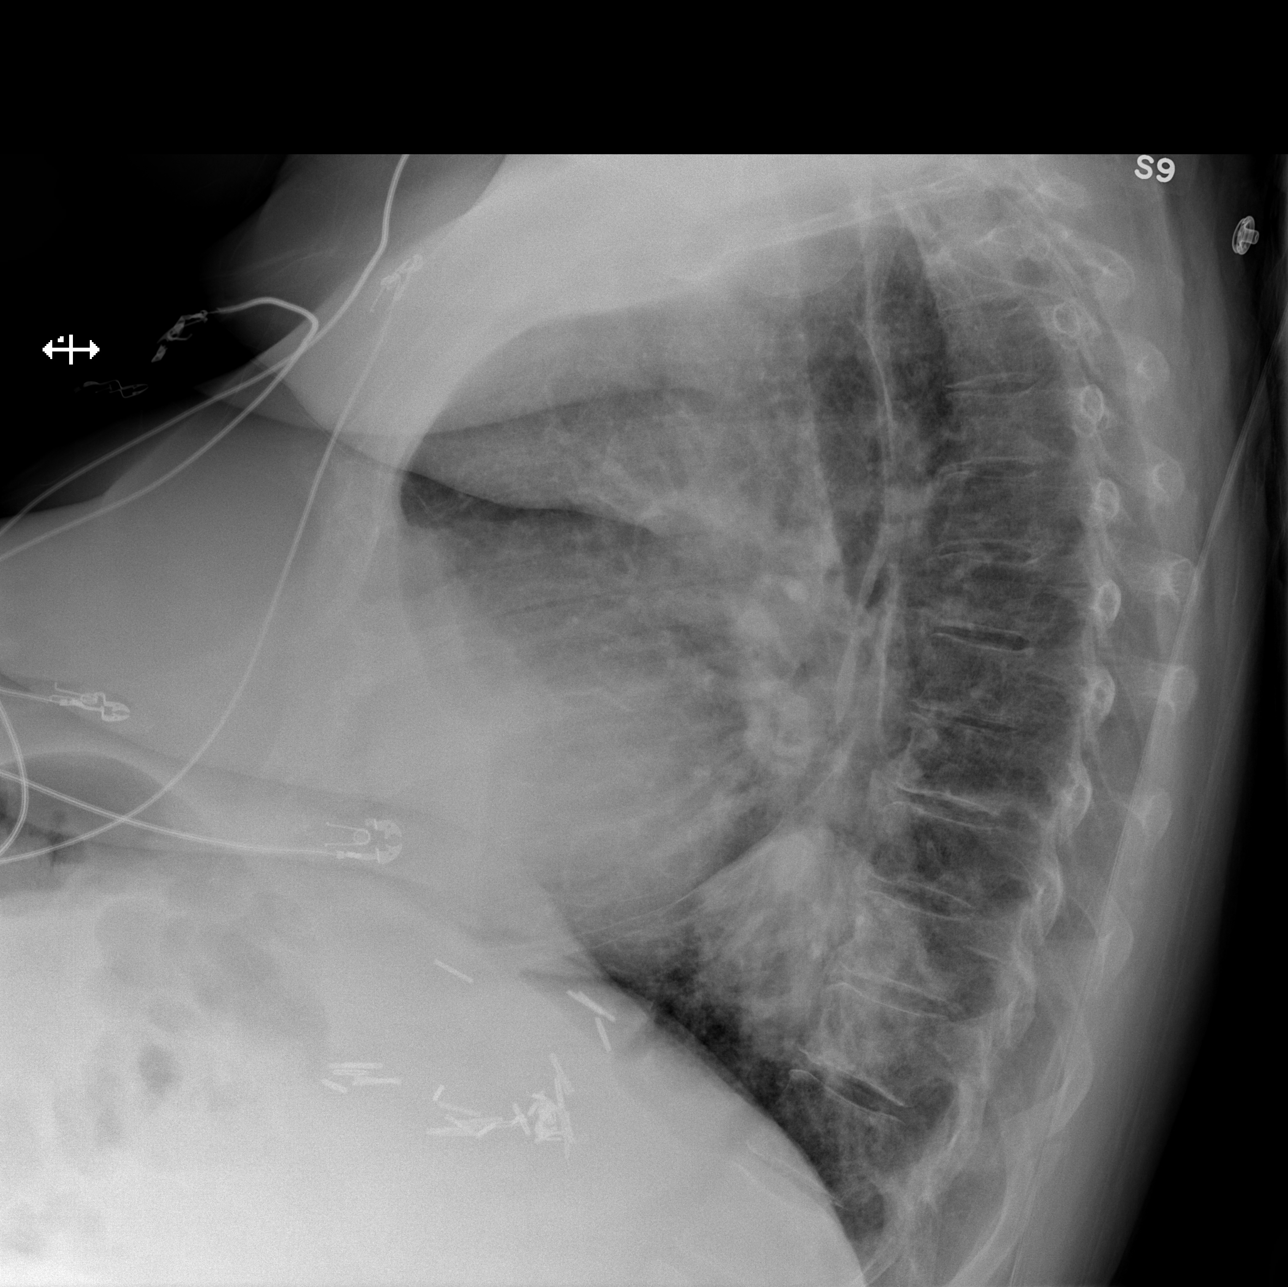

[x chest ap]
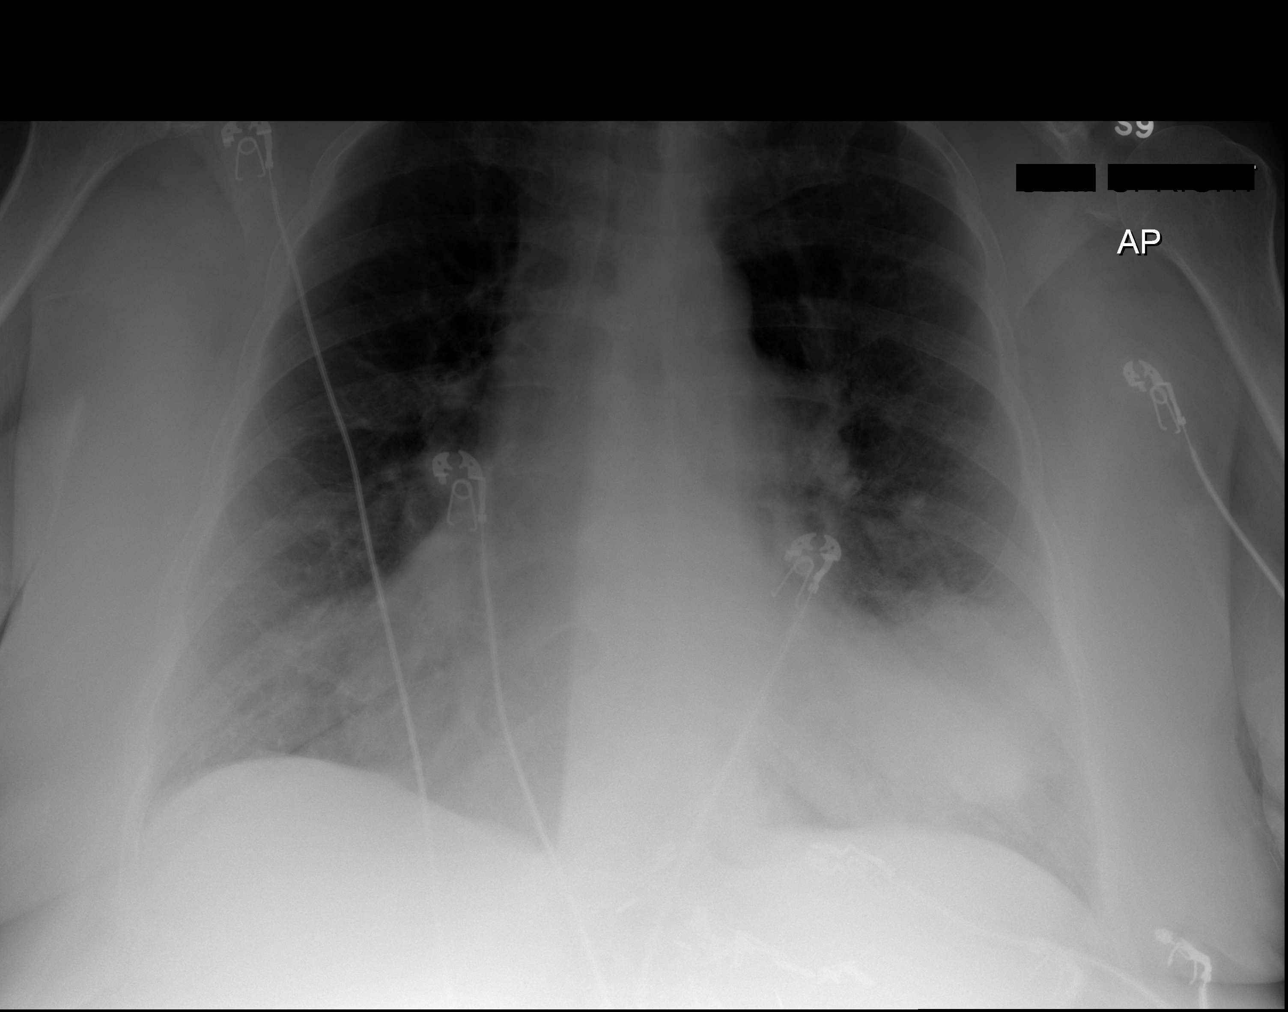

[3 of 3 positions shown; findings below may reference images not displayed]

FINDINGS: Left lower lung mass as before. Perihilar interstitial prominence.
Heart size upper limits normal. No pneumothorax.
No effusion.
Surgical clips in the upper abdomen.
IMPRESSION: 1. Left lower lobe mass as before.  No acute disease.

## 2017-09-21 IMAGING — DX DG TIBIA/FIBULA 2V*L*
4 series · 4 of 4 positions shown · non-contrast
Comparison: None.

CLINICAL DATA: Pt reports 45 minutes PTA she was stepping down step
from bathroom and left foot/leg gave out,laid on floor for 20
minutes before grandson got up to help her off floor. Pain to left
ankle, unable to bear full weight on left side. Pt reports she has
had increase in shortness of breath with rest. Pt usually only short
of breath with exertion

EXAM:
LEFT TIBIA AND FIBULA - 2 VIEW

[tibia ap (1 of 2)]
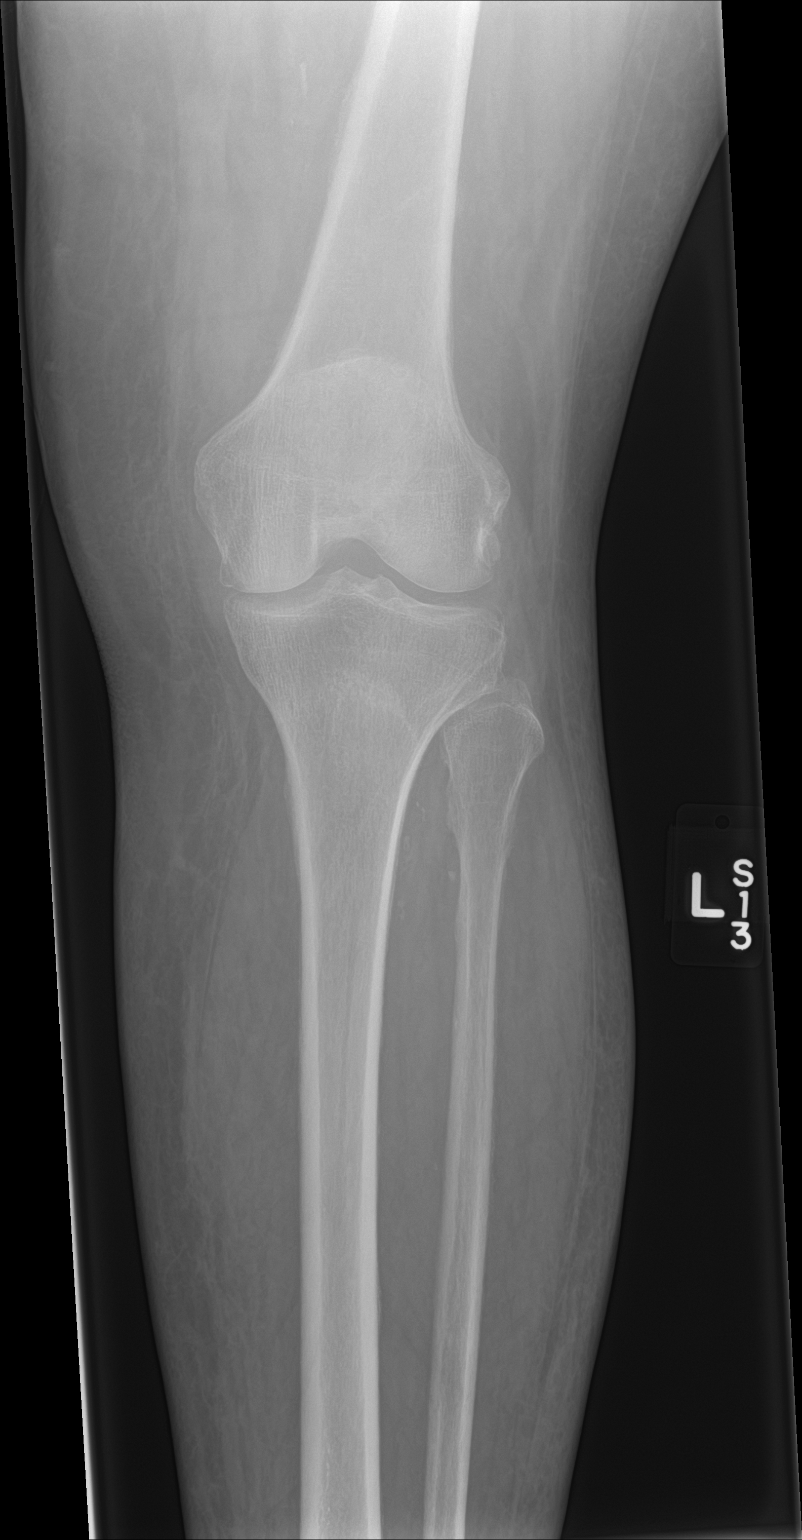

[tibia ap (2 of 2)]
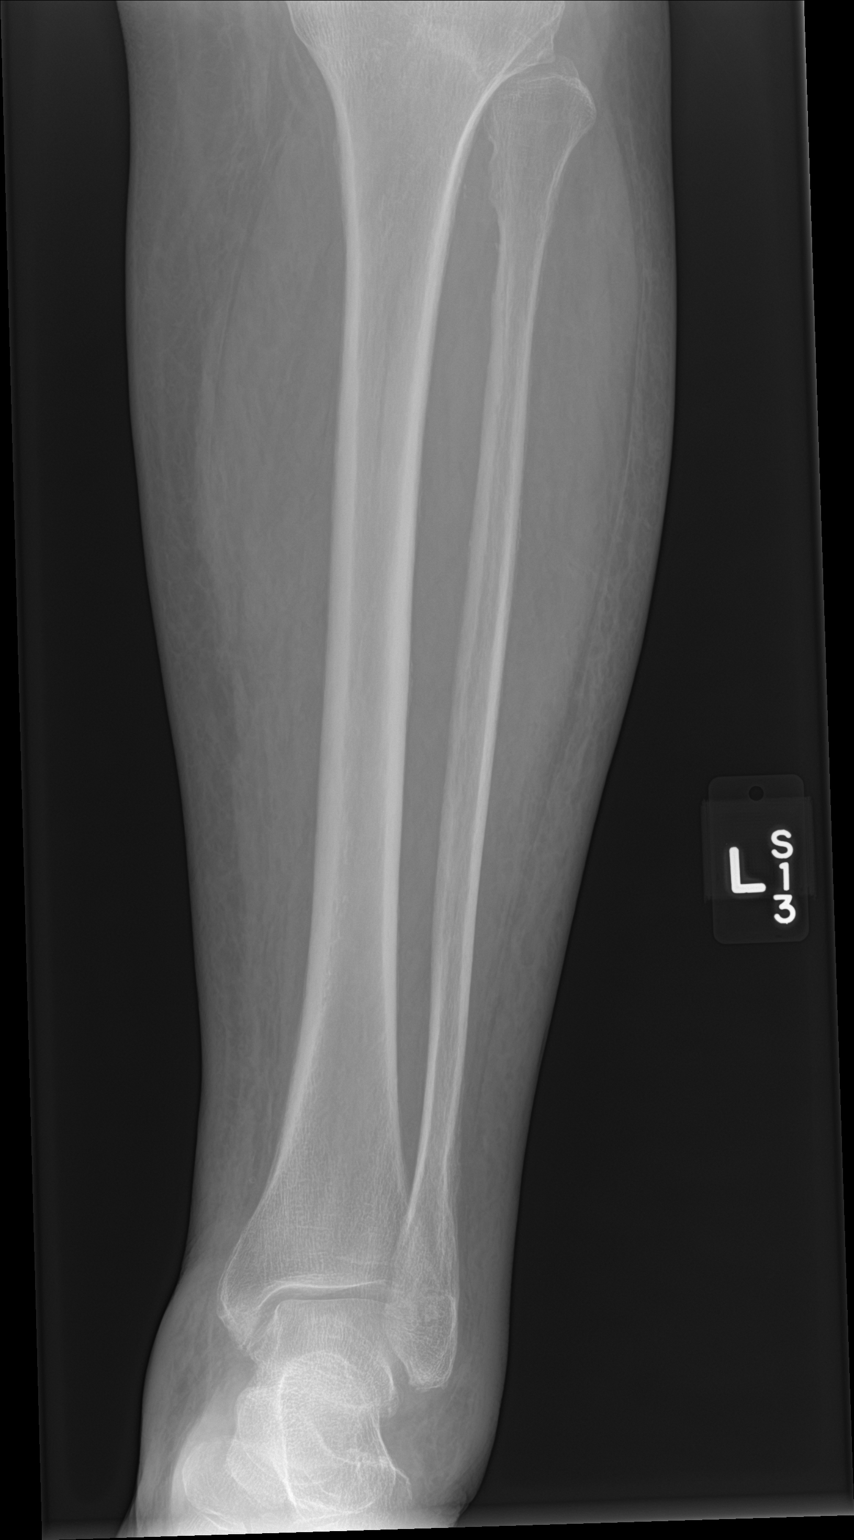

[tibia lat (1 of 2)]
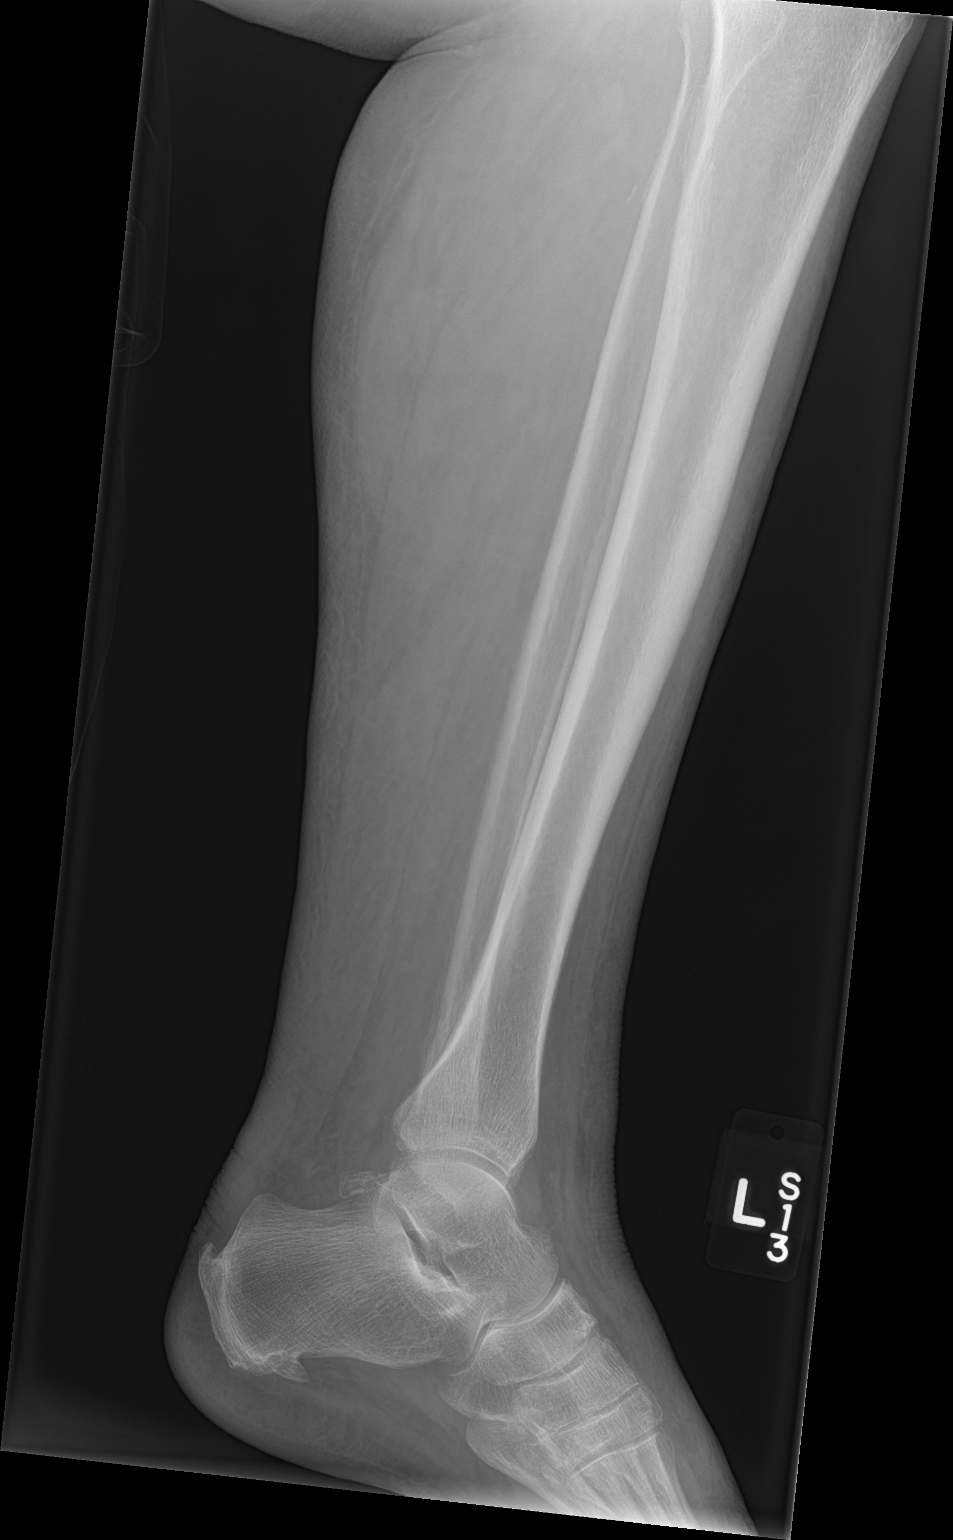

[tibia lat (2 of 2)]
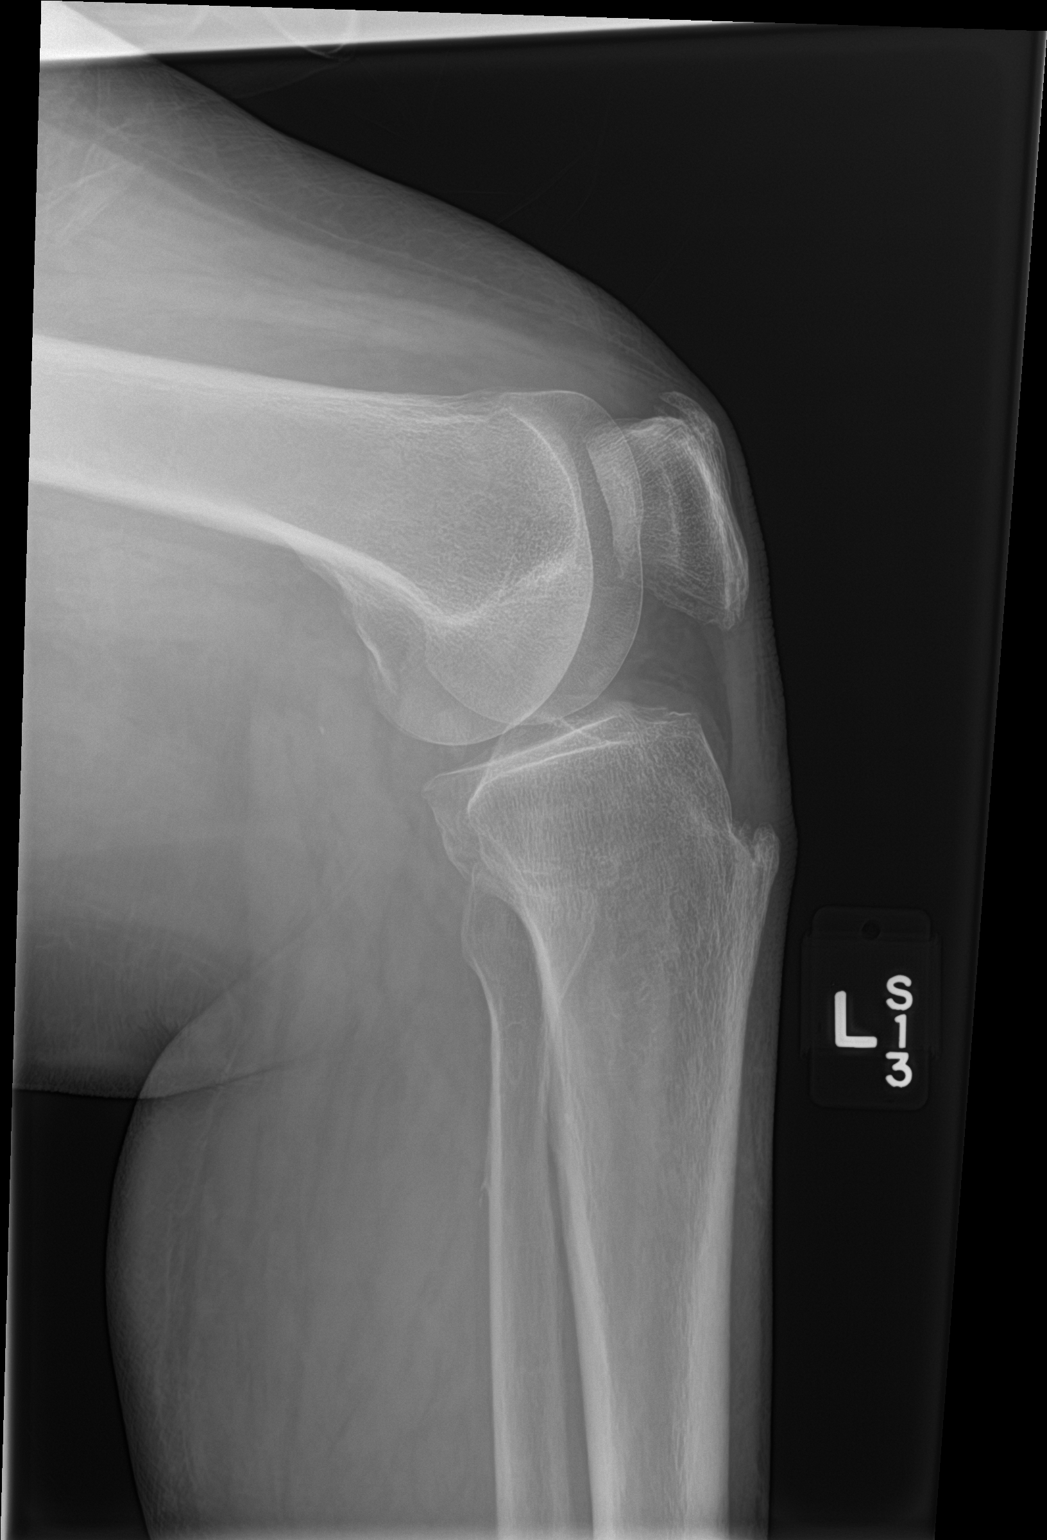

[4 of 4 positions shown; findings below may reference images not displayed]

FINDINGS: No convincing fracture. No bone lesion. Knee and ankle joints are
normally aligned.

There is diffuse subcutaneous soft tissue edema throughout the leg
most prominently involving the distal leg and ankle.
IMPRESSION: No fracture or dislocation.

## 2017-09-21 IMAGING — DX DG ANKLE COMPLETE 3+V*L*
3 series · 3 of 3 positions shown · non-contrast
Comparison: 07/01/2013 and 10/24/2011

CLINICAL DATA: Fall when stepping down from bathroom with left
ankle pain. Unable to bear weight.

EXAM:
LEFT ANKLE COMPLETE - 3+ VIEW

[ankle ap]
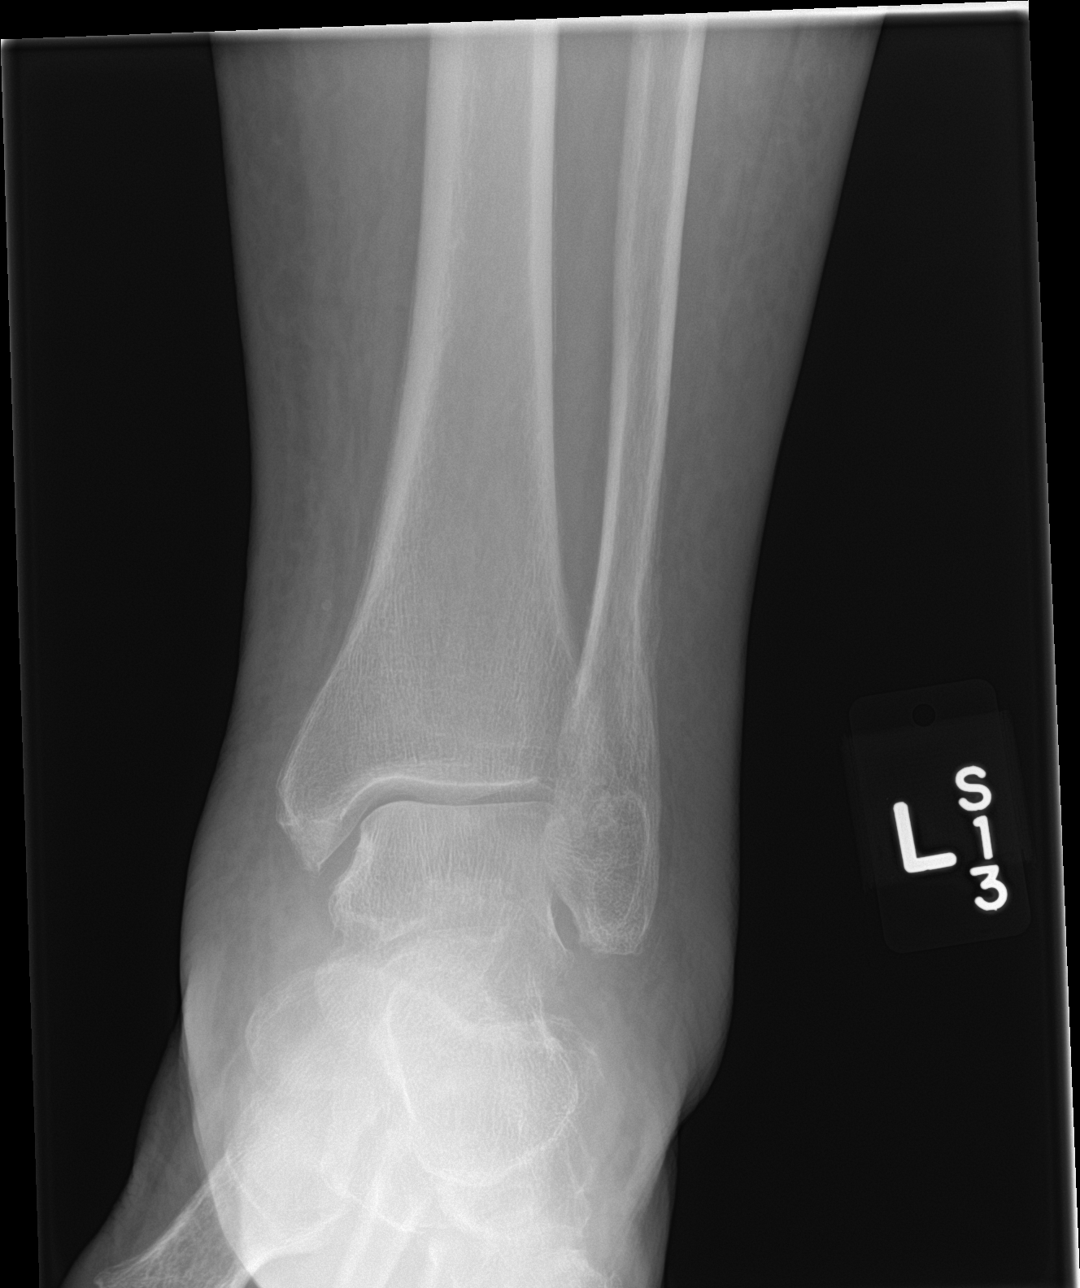

[ankle obl]
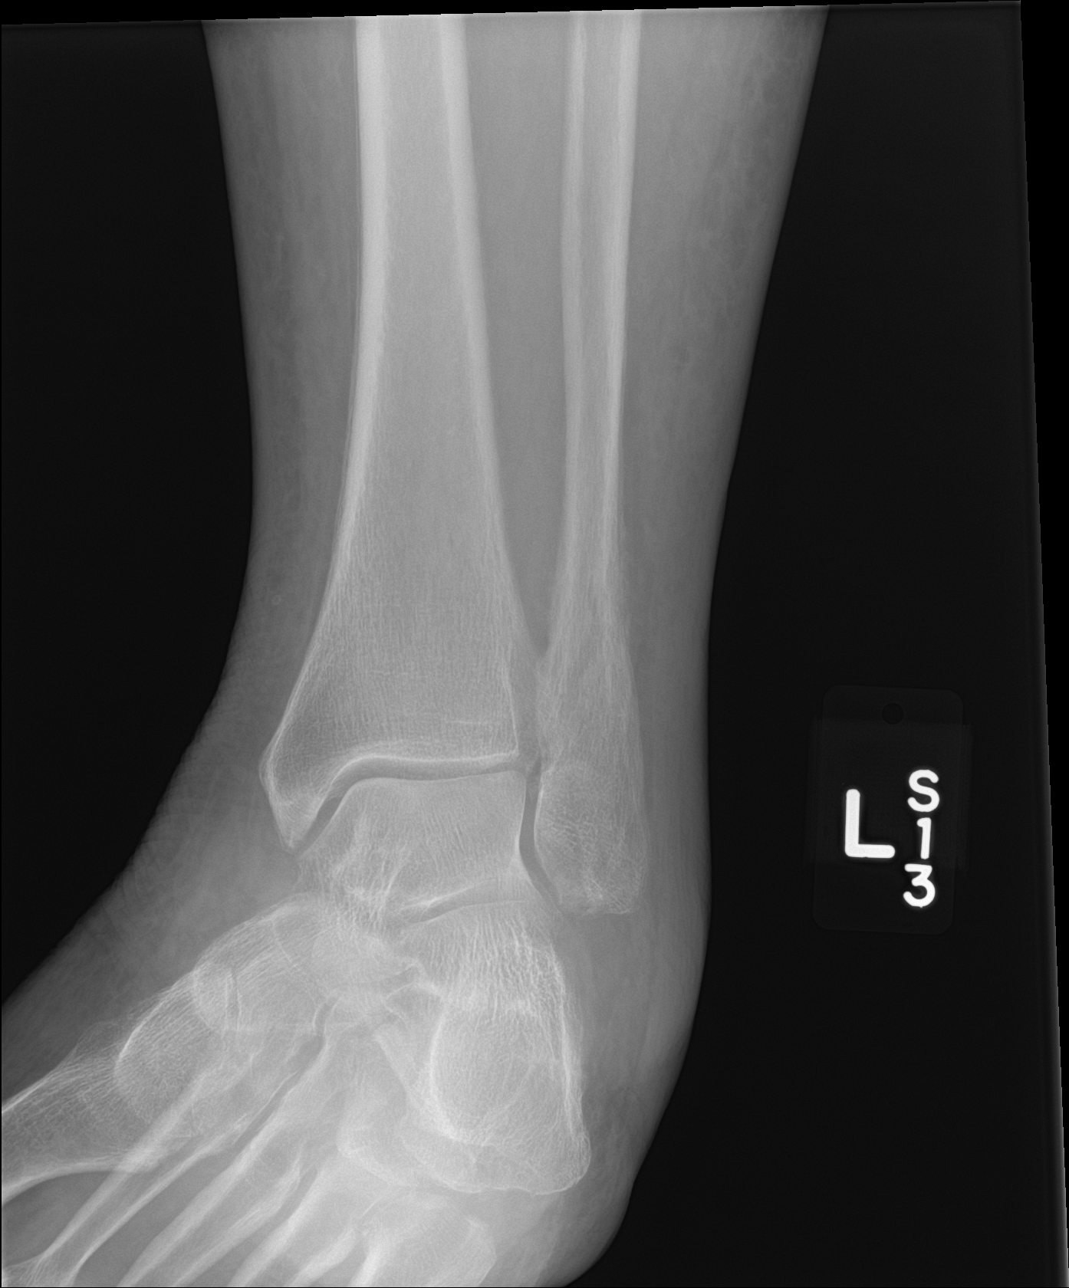

[ankle lat]
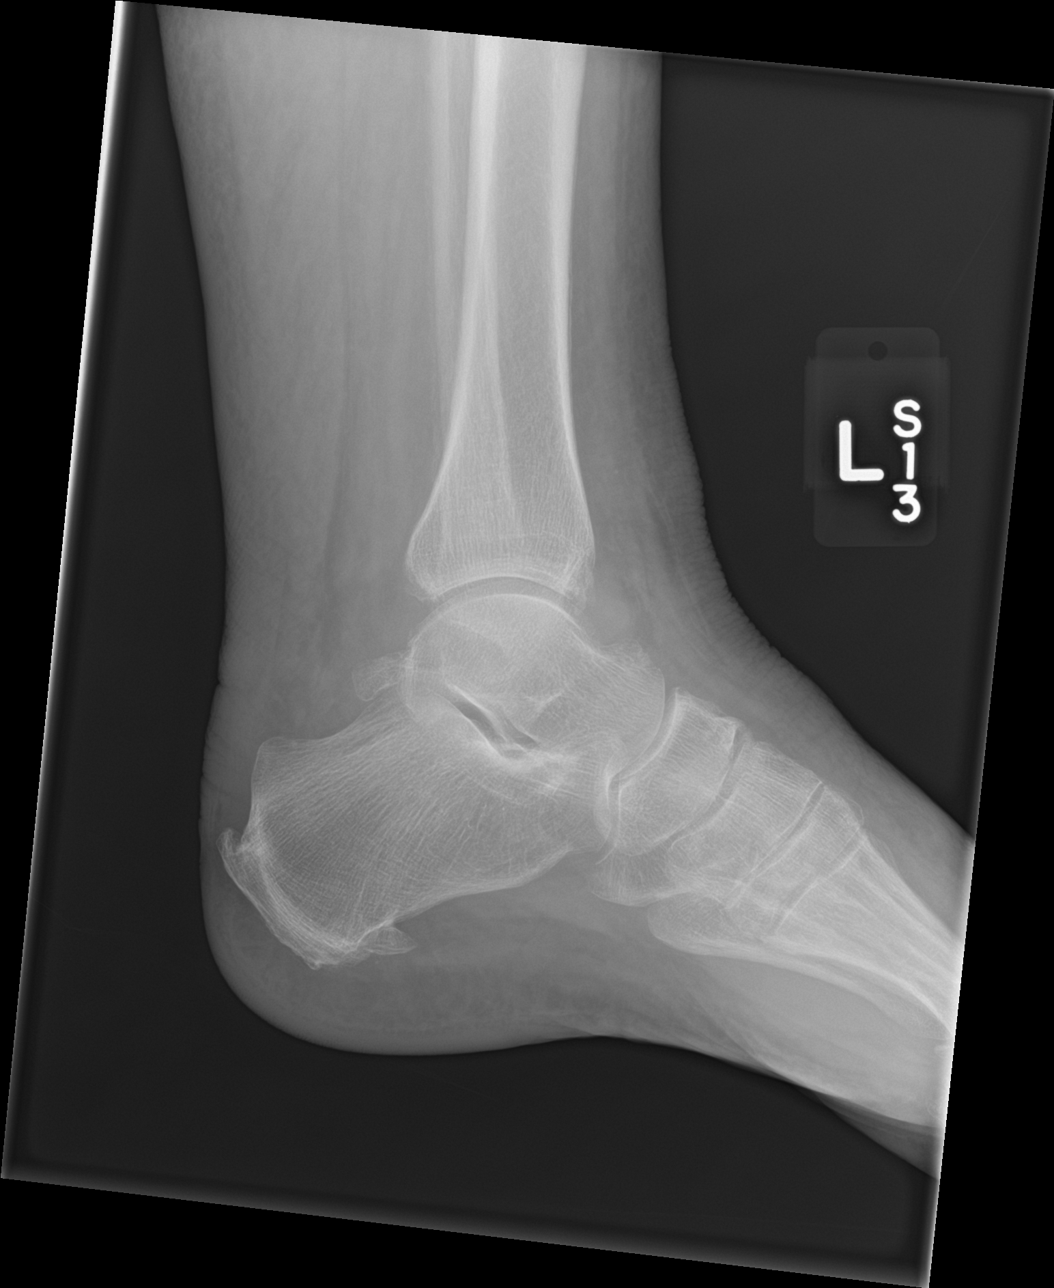

[3 of 3 positions shown; findings below may reference images not displayed]

FINDINGS: Moderate soft tissue swelling over the ankle. Ankle mortise is
within normal. Findings suggesting an old distal fibular fracture.
No acute fracture or dislocation. Minimal degenerative change over
the midfoot/hindfoot. Spurring over the posterior and inferior
calcaneus.
IMPRESSION: No acute fracture.

## 2017-10-07 IMAGING — DX DG CHEST 2V
2 series · 2 of 2 positions shown · non-contrast
Comparison: Chest radiograph February 15, 2015 and chest CT
February 15, 2015

CLINICAL DATA: Shortness of breath for several days. Right-sided
chest pain for 1 day. History of lung carcinoma on the left.

EXAM:
CHEST  2 VIEW

[chest lat]
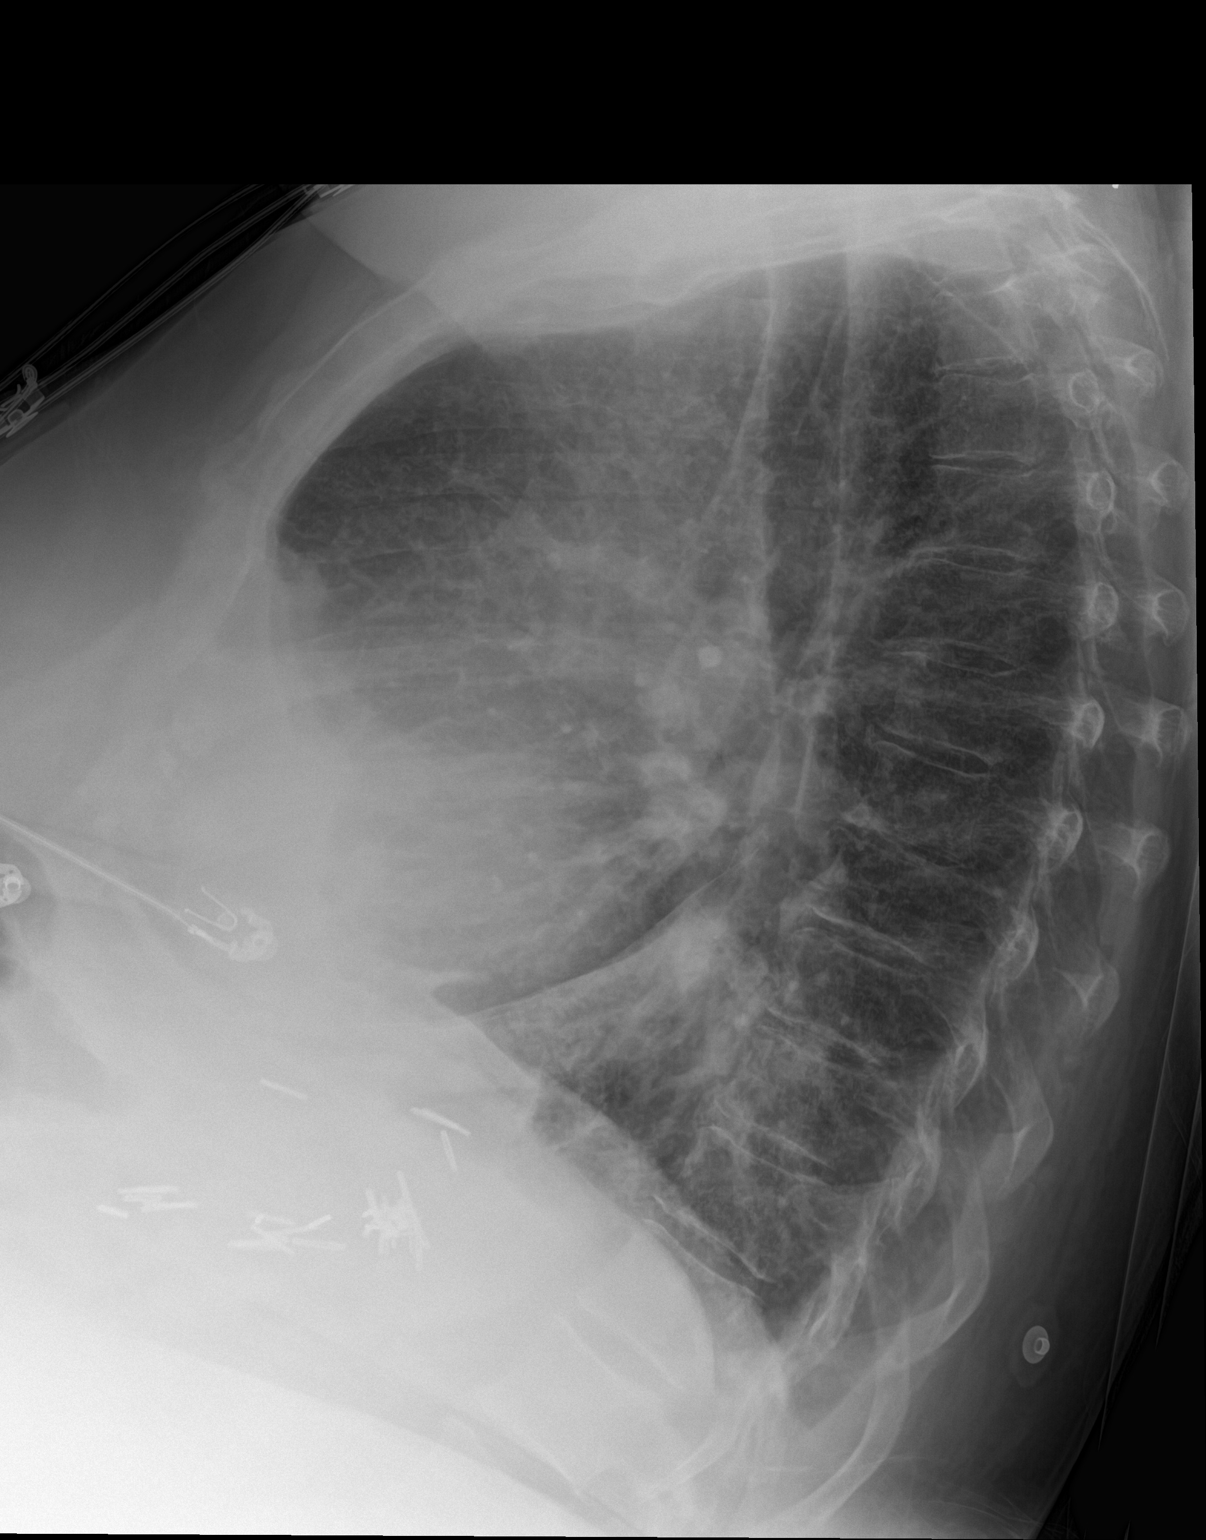

[chest ap]
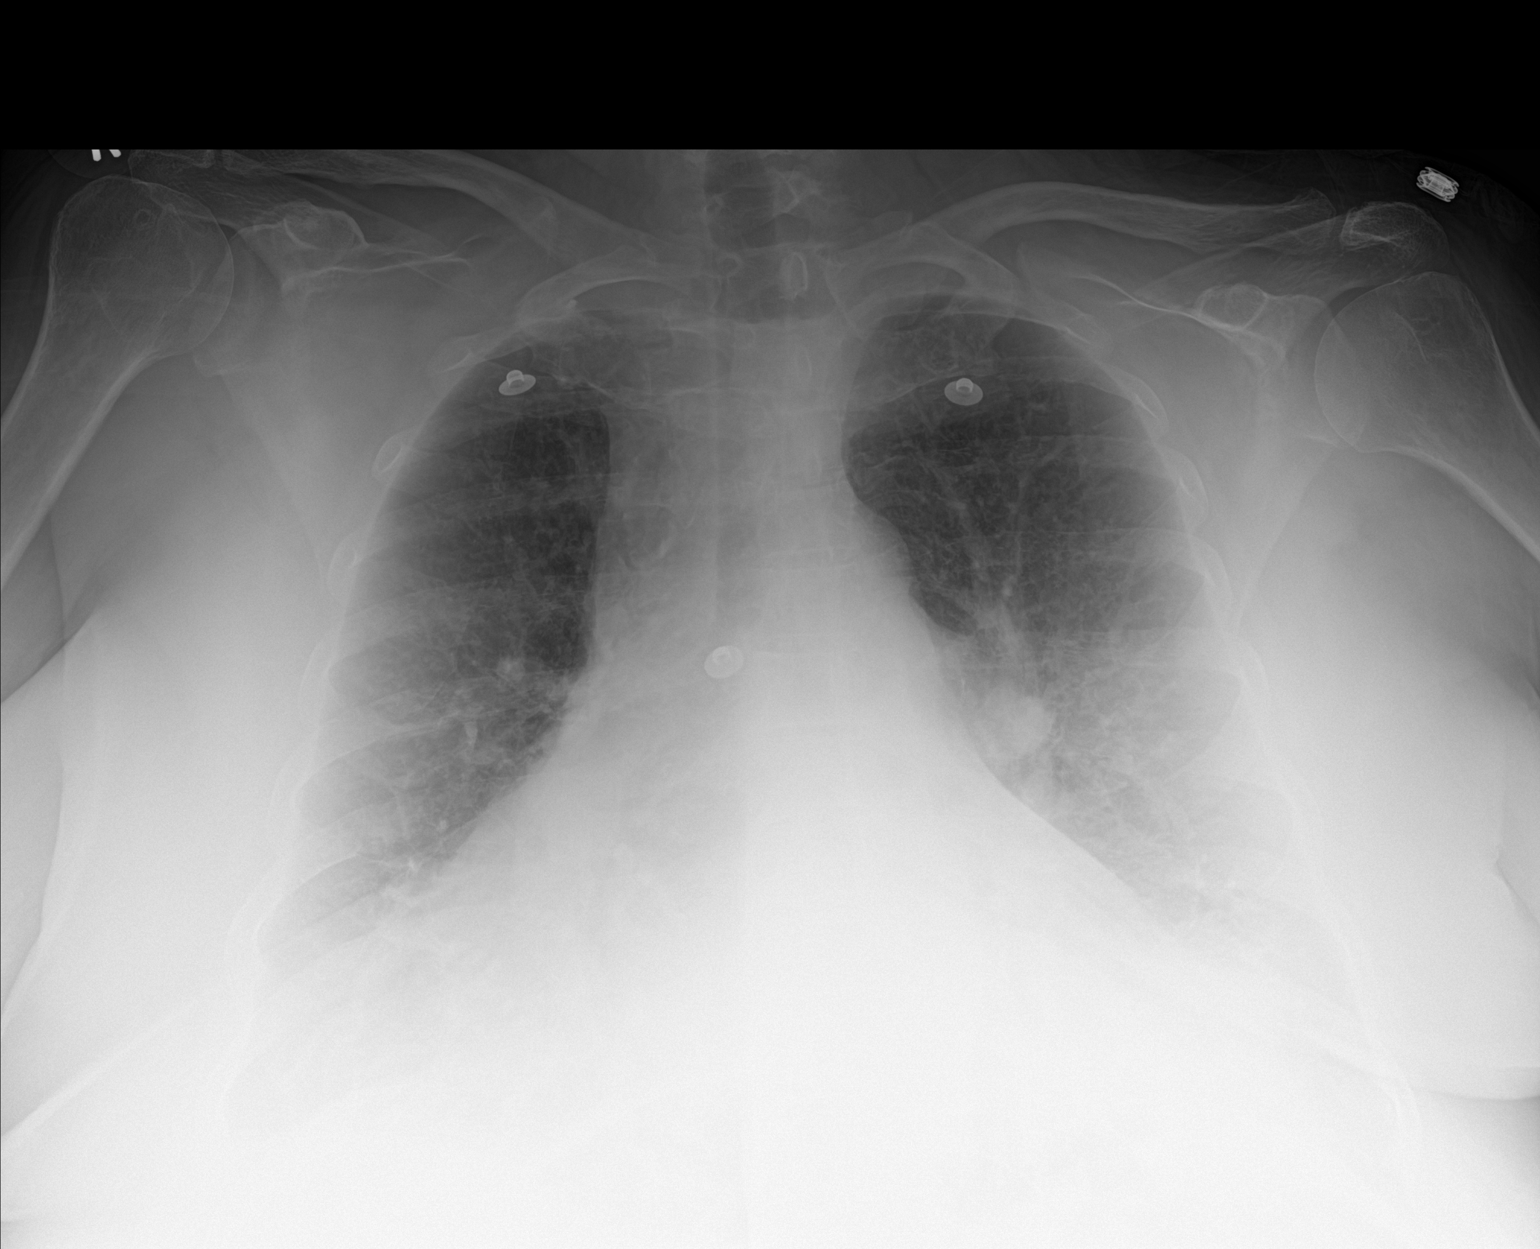

[2 of 2 positions shown; findings below may reference images not displayed]

FINDINGS: There is persistent left lower lobe airspace consolidation. There is
new patchy consolidation in medial right base. Lungs elsewhere
clear. Heart is mildly enlarged but stable. Pulmonary vascularity is
within normal limits. No adenopathy apparent. No bone lesions.
IMPRESSION: Persistent left lower lobe airspace consolidation consistent with
pneumonia. New patchy airspace consolidation medial right base.
Stable cardiac prominence.

## 2017-10-19 IMAGING — DX DG CHEST 2V
2 series · 2 of 2 positions shown · non-contrast
Comparison: 03/03/2015 and earlier

CLINICAL DATA: 59-year-old female with shortness of breath and
cough for 1 day. Hyper metabolic left lower lobe tumor on PET-CT in
[REDACTED]. Subsequent encounter.

EXAM:
CHEST  2 VIEW

[chest lat]
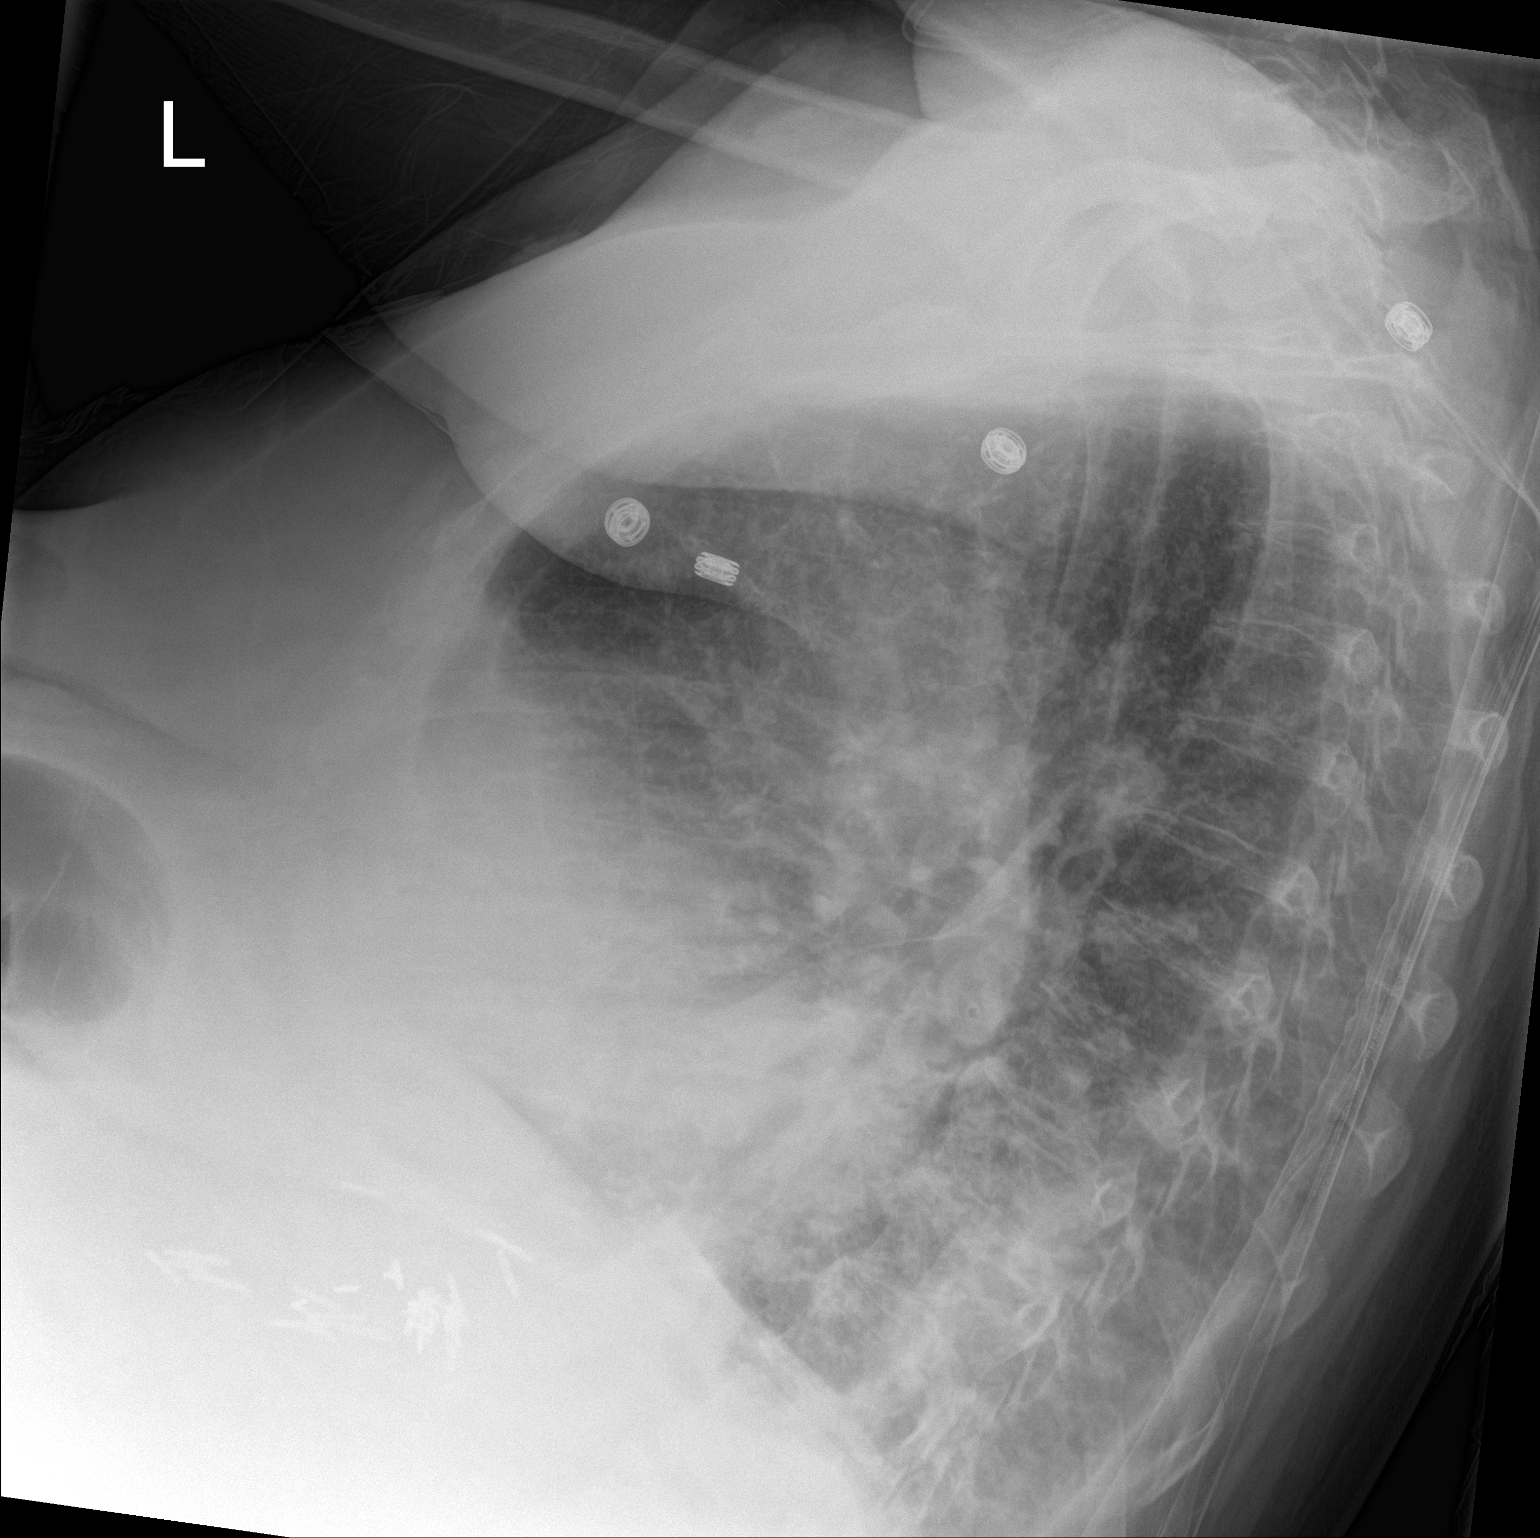

[chest ap]
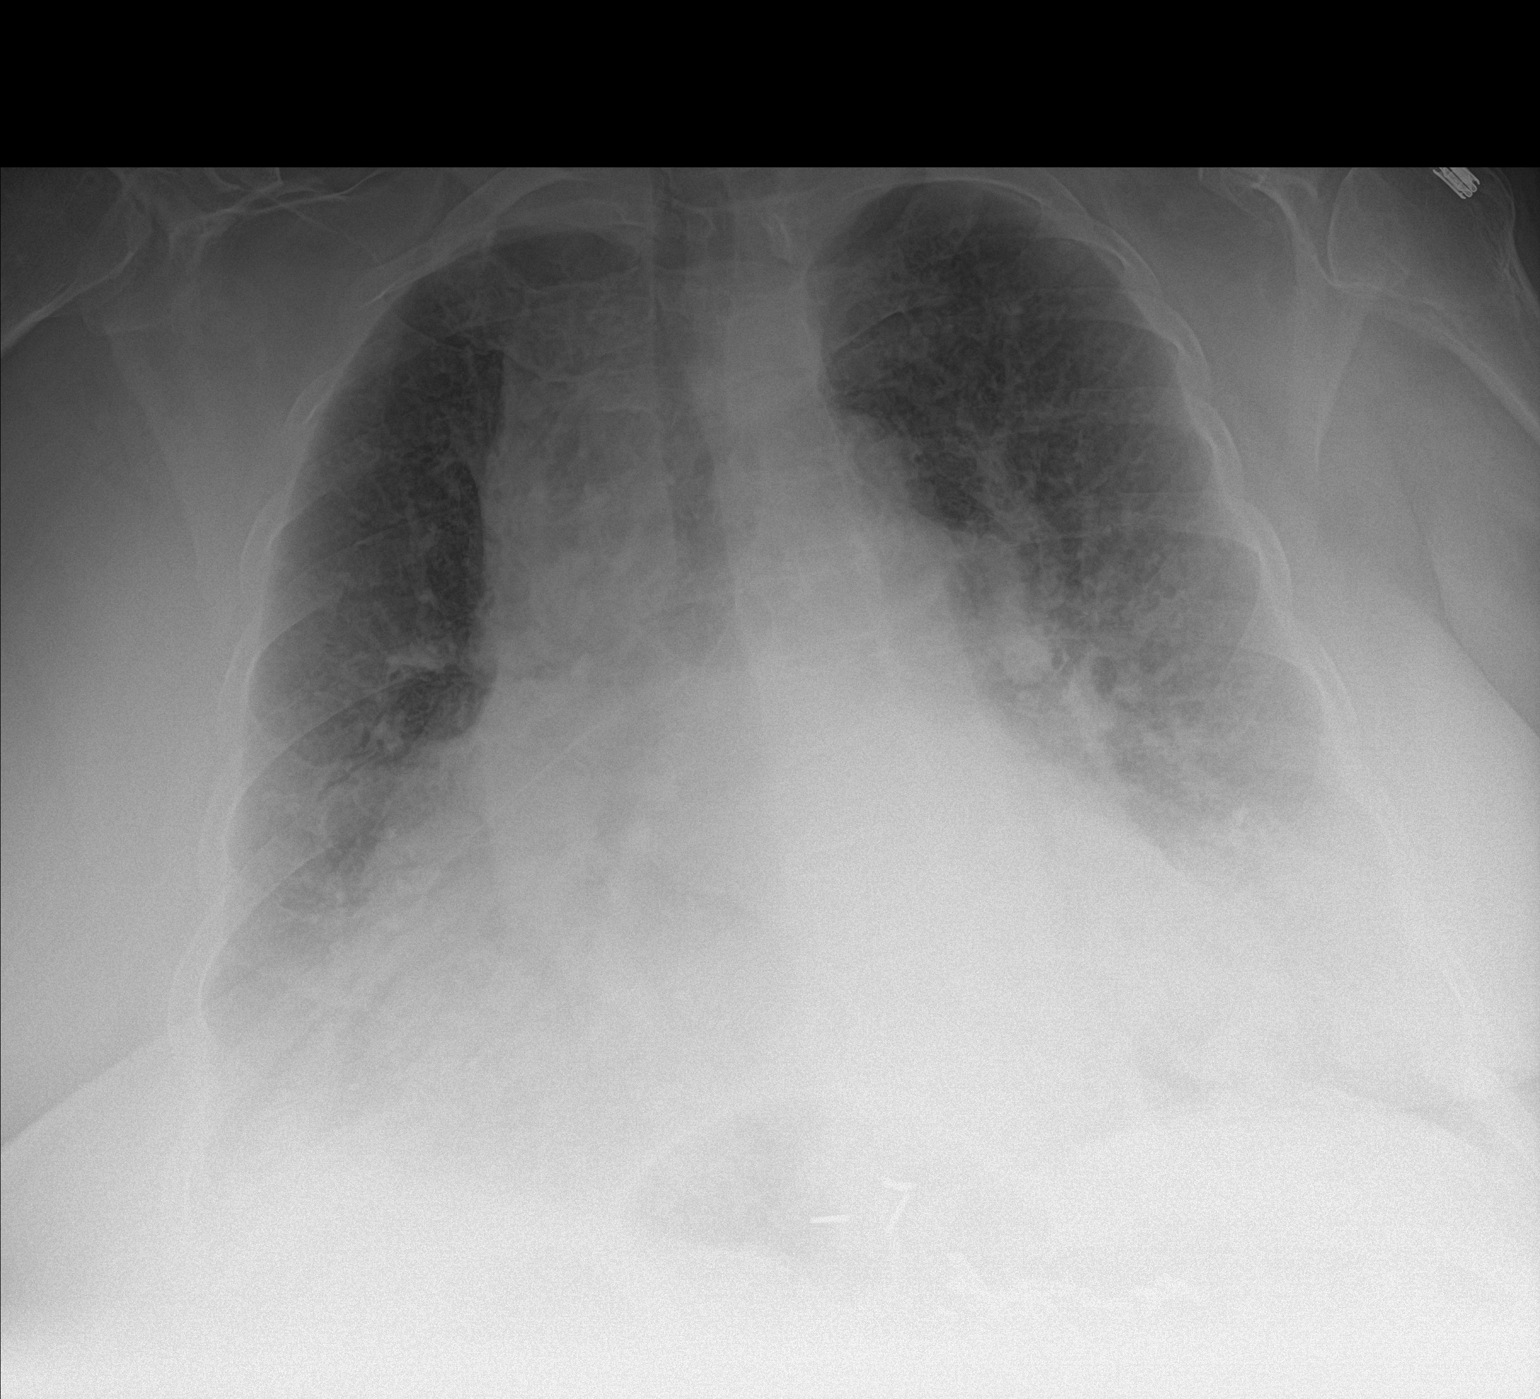

[2 of 2 positions shown; findings below may reference images not displayed]

FINDINGS: Confluent left lung base opacity in the area of the hypermetabolic
tumor, lateral basal segment left lower lobe. On CTA in [REDACTED]
pneumonia surrounded the mass. No pleural effusion. Cardiomegaly and
mediastinal lipomatosis. Right lung base appears stable. No
pneumothorax or pulmonary edema. Osteopenia. No acute osseous
abnormality identified. Extensive surgical clips in the epigastrium
again noted.
IMPRESSION: 1. Continued left lower lobe pneumonia since [REDACTED], probably
postobstructive in relation to known left lower lobe hypermetabolic
tumor. No pleural effusion.
2. No new No acute cardiopulmonary abnormality.

## 2017-11-14 IMAGING — DX DG CHEST 2V
2 series · 2 of 2 positions shown · non-contrast
Comparison: 03/15/2015

CLINICAL DATA: Exacerbation of COPD.

EXAM:
CHEST  2 VIEW

[chest pa]
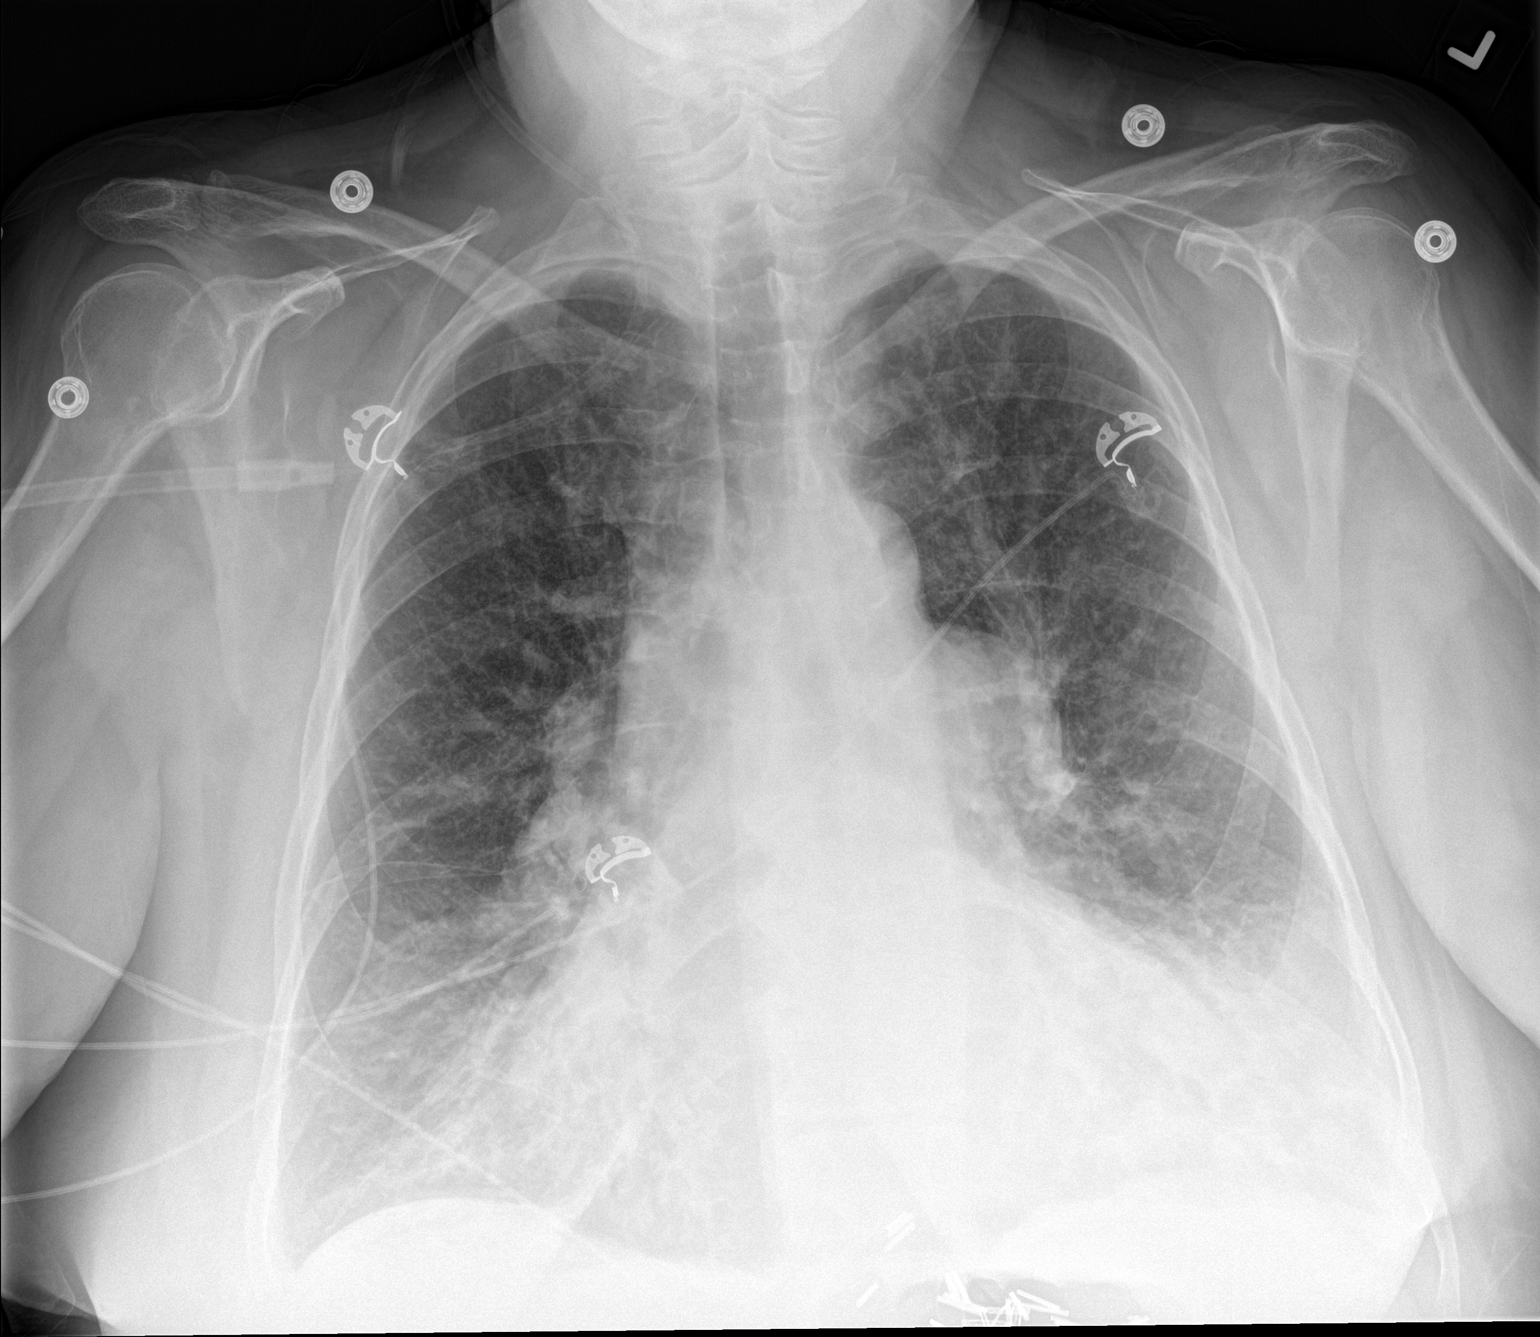

[chest lat]
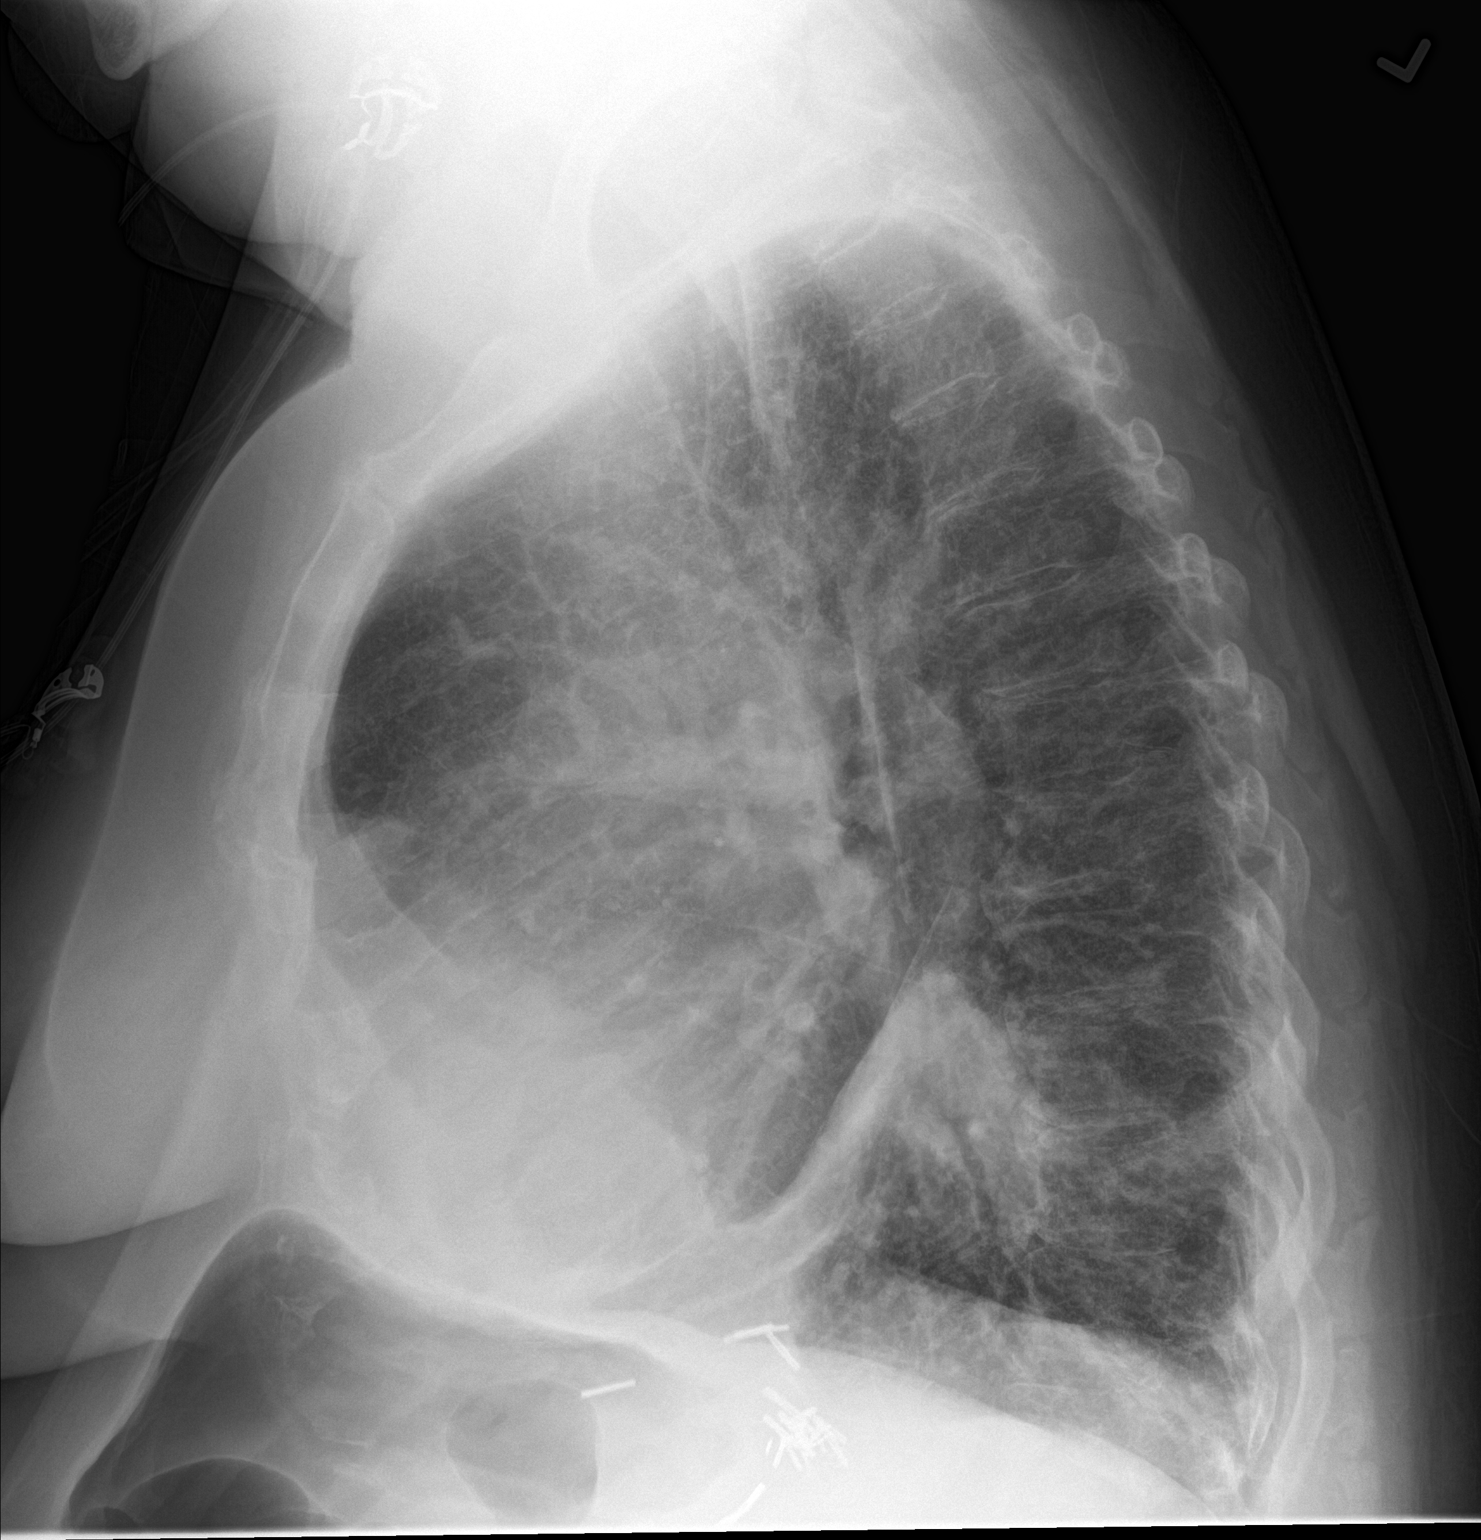

[2 of 2 positions shown; findings below may reference images not displayed]

FINDINGS: Dz since prior exam, the patchy airspace consolidation in the left
lower lobe has become more focal bordering on the oblique fissure.
Interstitial thickening noted more diffusely in the prior study has
improved. Lungs remain hyperexpanded. No pleural effusion or
pneumothorax.

Cardiac silhouette is mildly enlarged. No convincing mediastinal or
hilar masses or adenopathy.

There are multiple surgical vascular clips in the epigastric region.

Bony thorax is grossly intact.
IMPRESSION: 1. Improved overall lung aeration when compared to the prior chest
radiograph with breast interstitial thickening. This suggests
improved interstitial edema.
2. There is persistent left lower lobe opacity has become more
consolidated since the prior CT. Some of this may be due to
atelectasis.
3. There are no new areas of lung opacity.

## 2017-11-16 IMAGING — CR DG CHEST 2V
2 series · 2 of 2 positions shown · non-contrast
Comparison: 04/10/2015

CLINICAL DATA: COPD exacerbation. Shortness of breath, cough, and
chest pain.

EXAM:
CHEST  2 VIEW

[chest pa]
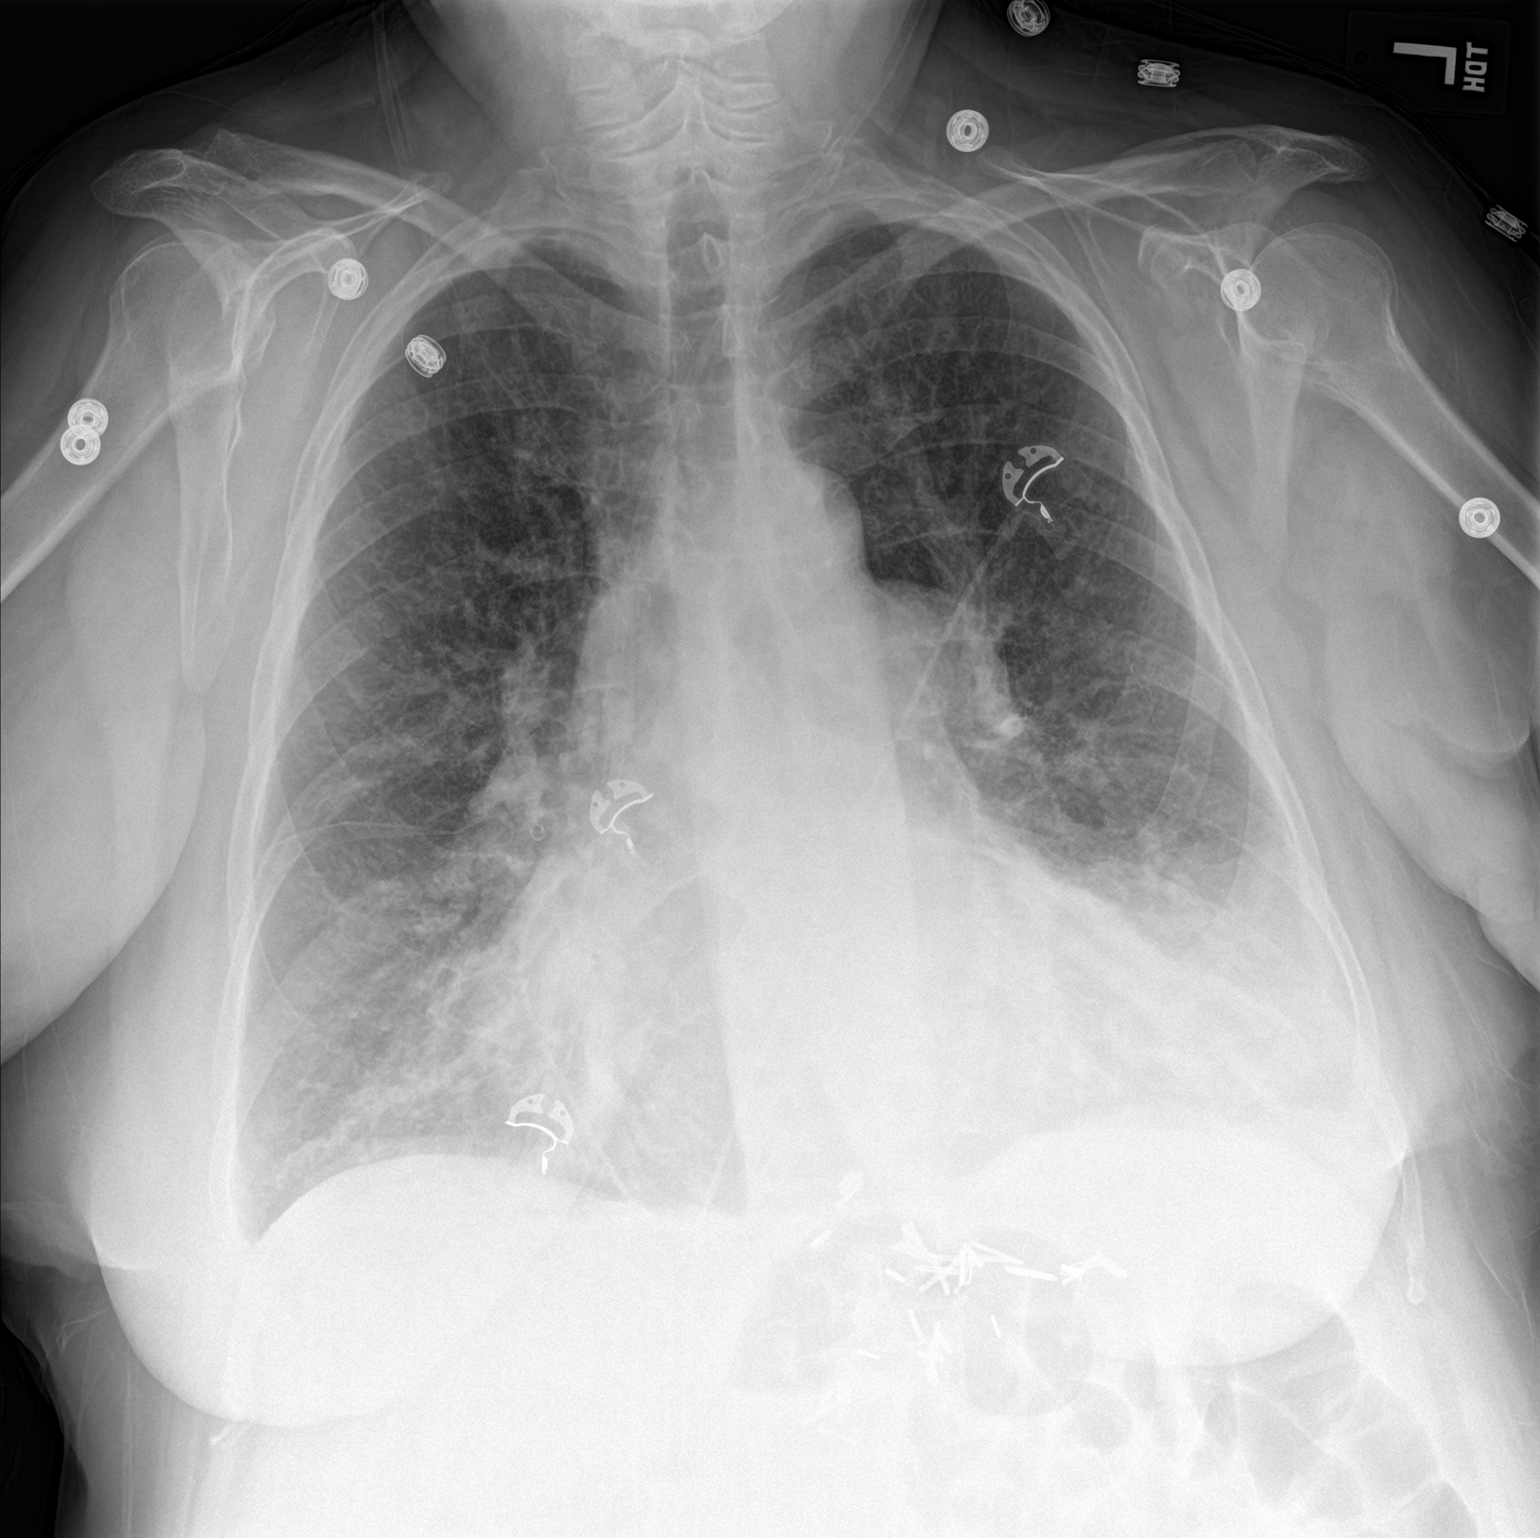

[chest lat]
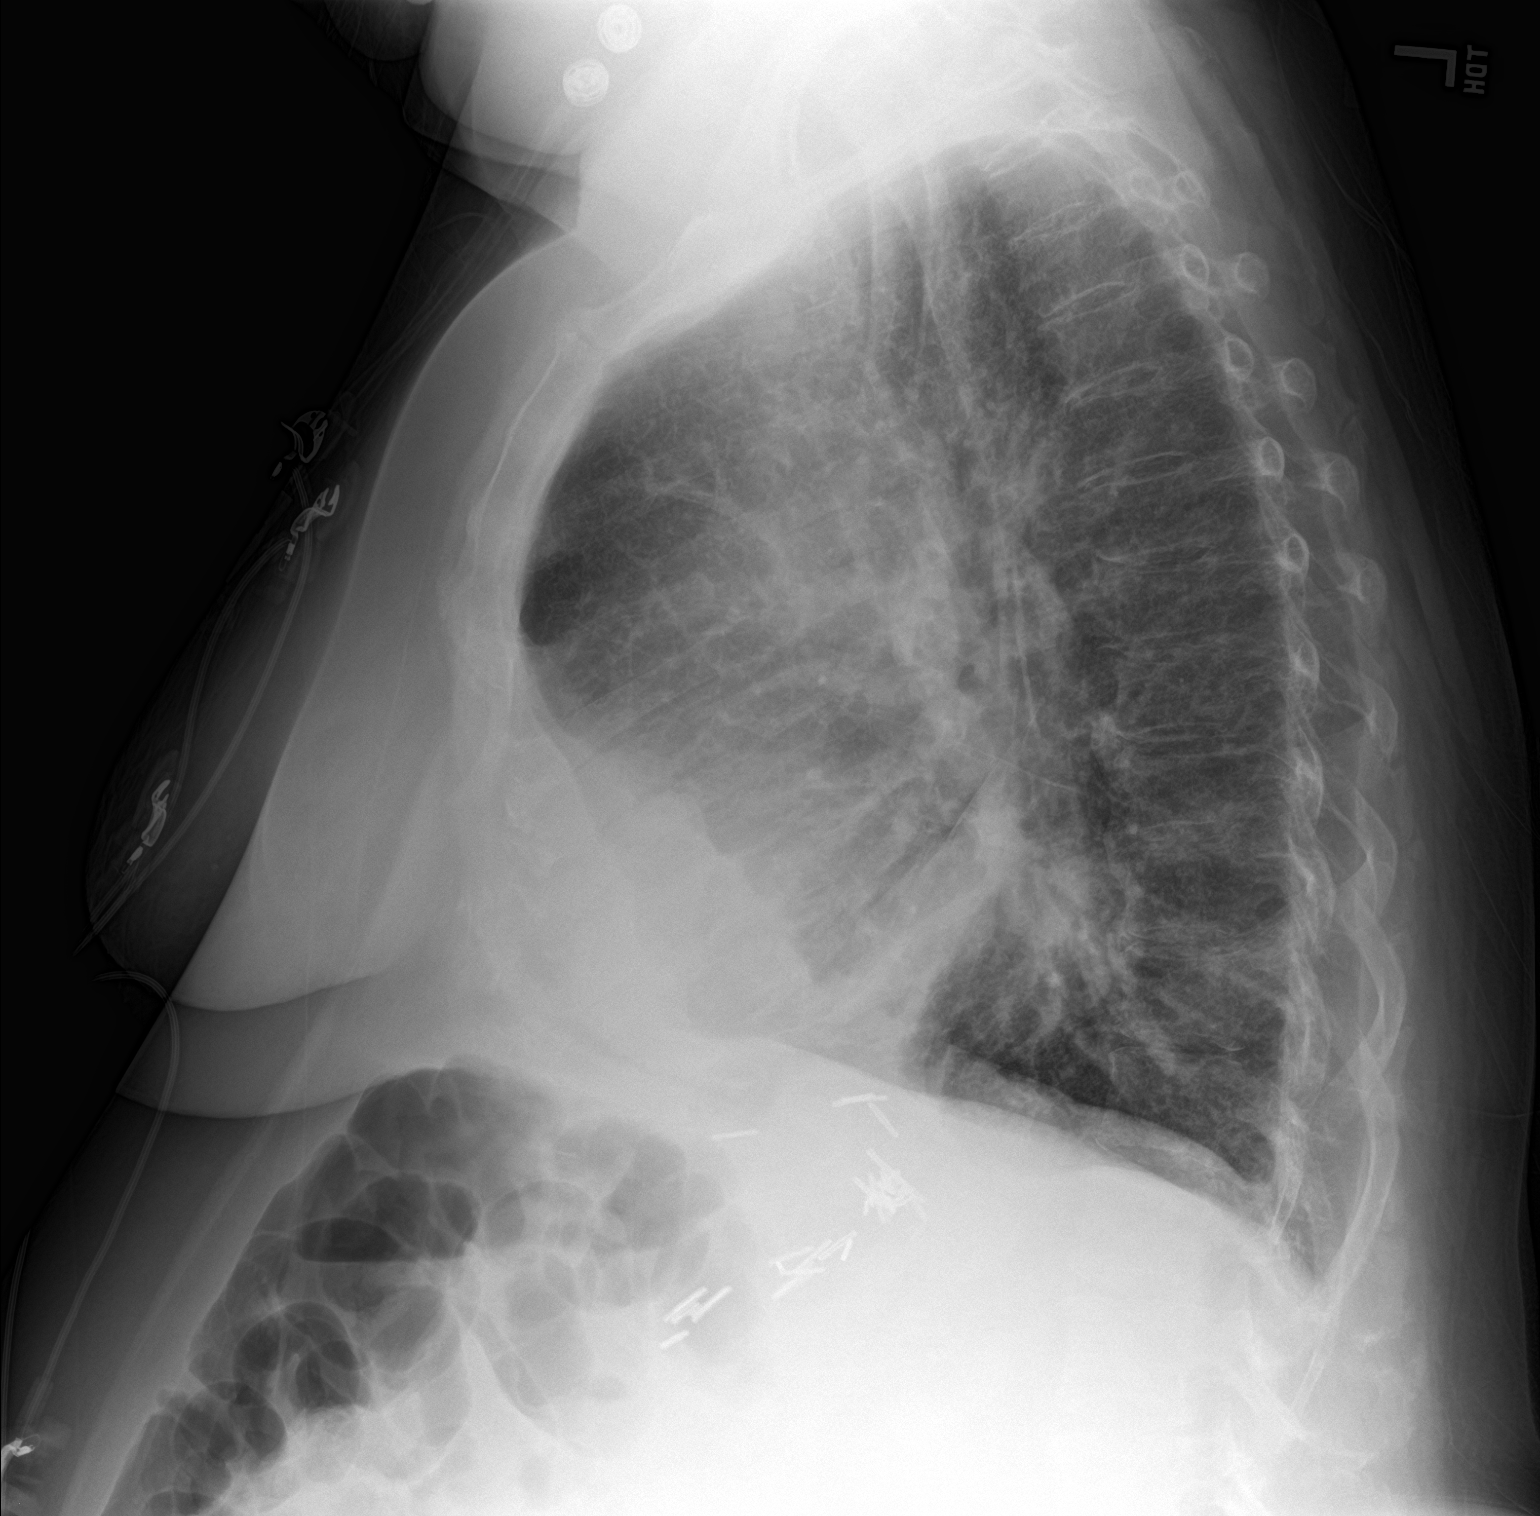

[2 of 2 positions shown; findings below may reference images not displayed]

FINDINGS: Cardiac silhouette remains enlarged. The lungs are hyperinflated.
Mild diffuse interstitial prominence is unchanged. Focal rounded
opacity in the left lower lobe (in the region of known lung mass
demonstrated on prior CT and PET-CT) persists, with additional
patchy opacities in the left lower lobe having slightly improved
from yesterday's study. Streaky right basilar opacity is unchanged.
No acute osseous abnormality is identified. Numerous surgical clips
are noted in the epigastric region.
IMPRESSION: Slightly improved aeration of the left lung base, otherwise
unchanged examination.

## 2017-11-28 IMAGING — CT CT CHEST W/ CM
2 of 4 series · 15 of 36 positions shown, 18 images · IV contrast (omnipaque)
Comparison: Multiple exams, including 02/05/2015

CLINICAL DATA: Left-sided non-small cell lung cancer diagnosed in
[REDACTED] 3128. Prior radiation therapy.

EXAM:
CT CHEST WITH CONTRAST
TECHNIQUE: Multidetector CT imaging of the chest was performed during
intravenous contrast administration.
CONTRAST:  75mL OMNIPAQUE IOHEXOL 300 MG/ML  SOLN

[Series 2: chest with st · axial · 0.79mm/px · z∈[-352,-72]mm · 12 of 66 slices shown, 15 images]
[im 5/66  mediastinal]
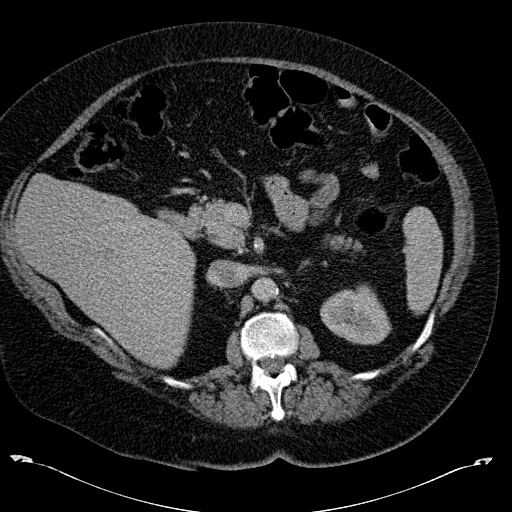
[im 5/66  lung]
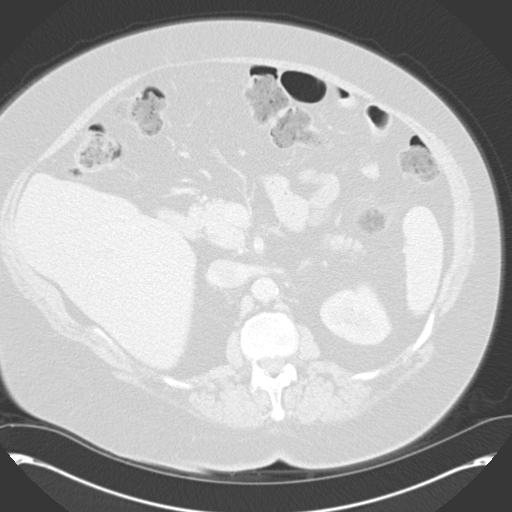
[im 10/66  lung]
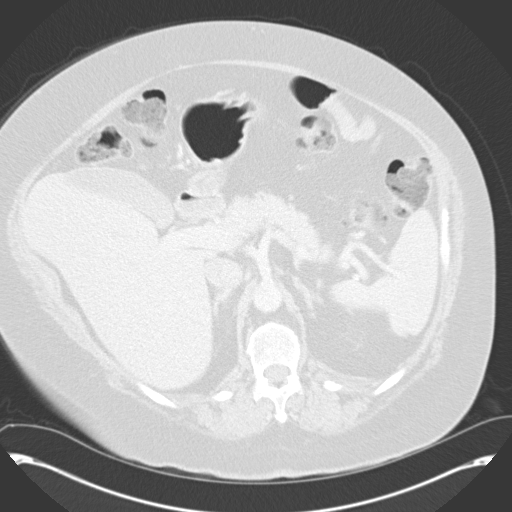
[im 14/66  lung]
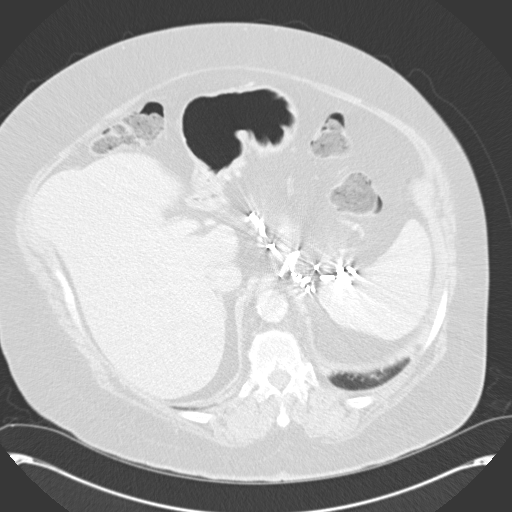
[im 19/66  lung]
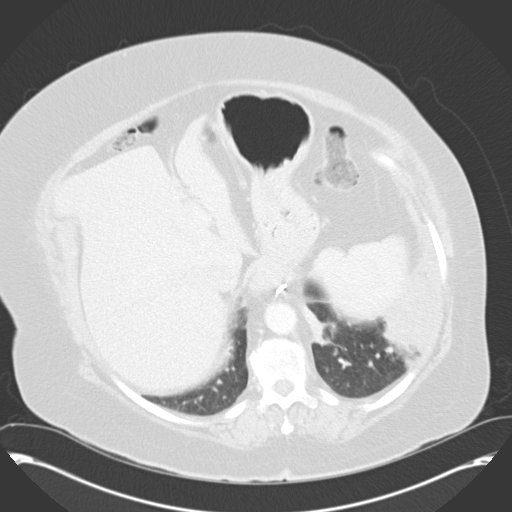
[im 24/66  mediastinal]
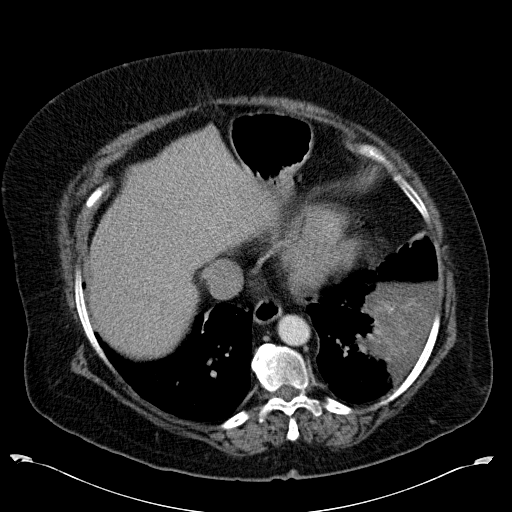
[im 24/66  lung]
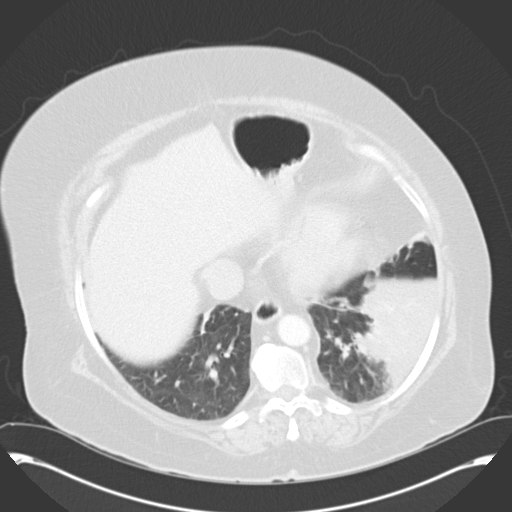
[im 28/66  lung]
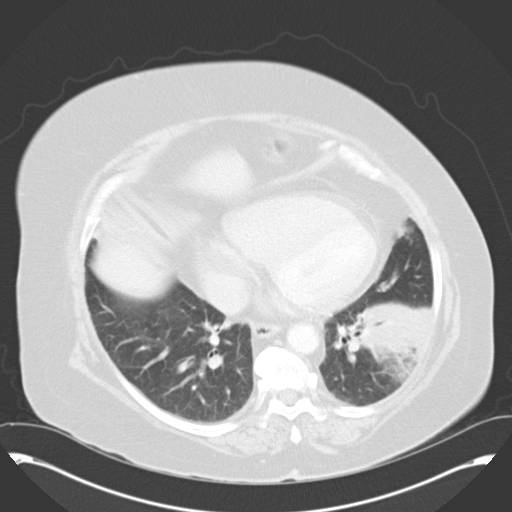
[im 38/66  lung]
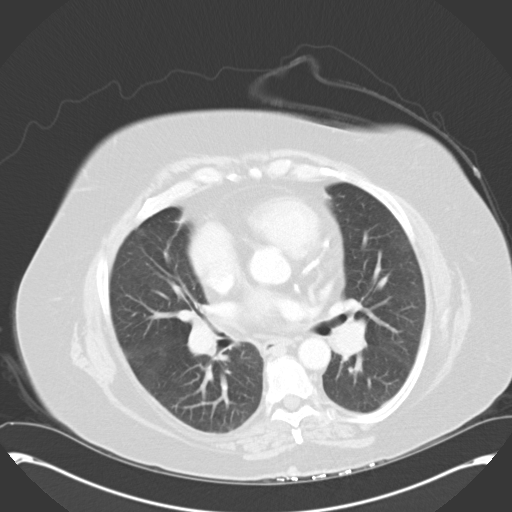
[im 42/66  lung]
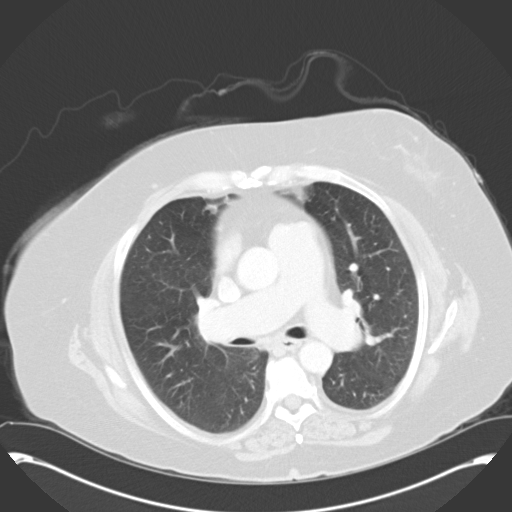
[im 47/66  mediastinal]
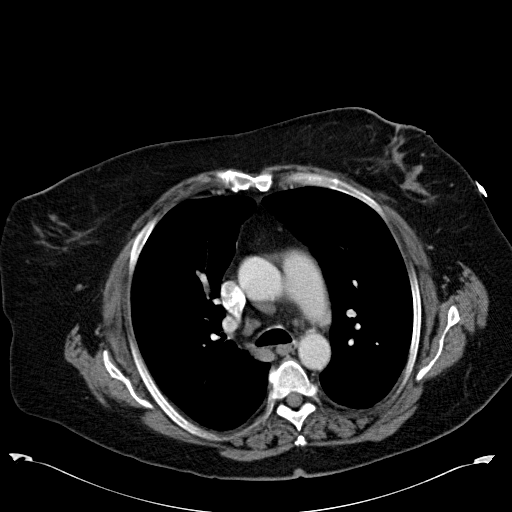
[im 47/66  lung]
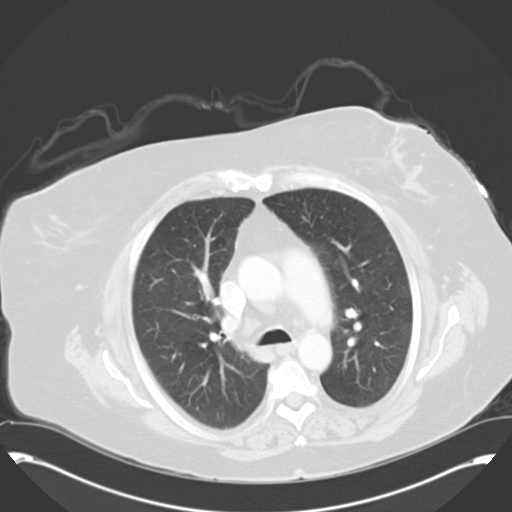
[im 52/66  lung]
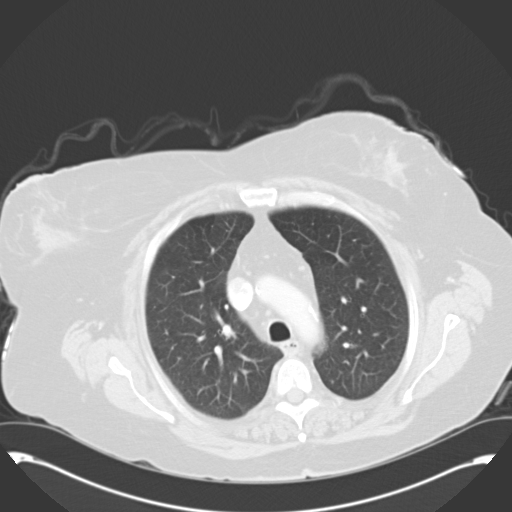
[im 56/66  lung]
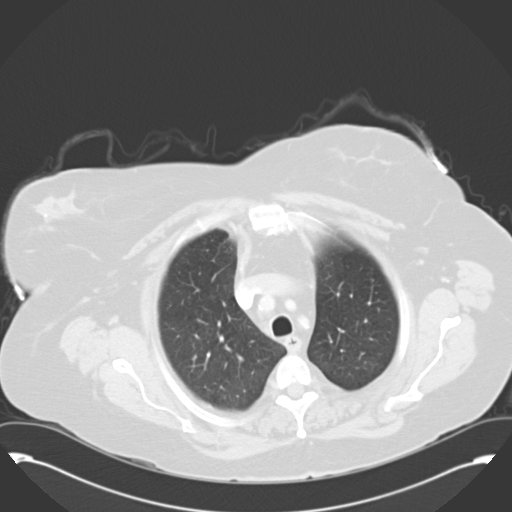
[im 61/66  lung]
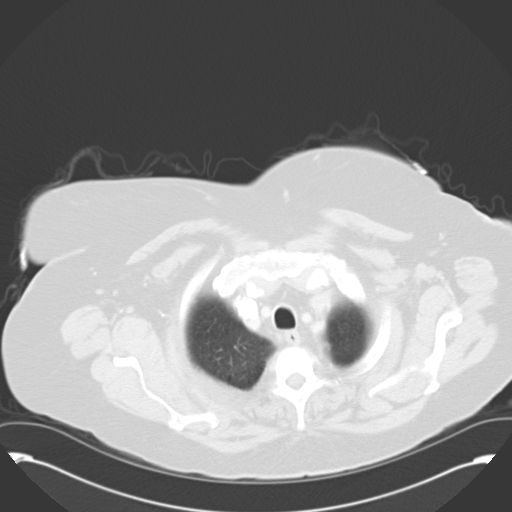

[Series 602: <mpr thick range> · coronal · 0.79mm/px · 3 of 121 slices shown]
[im 25/121  lung]
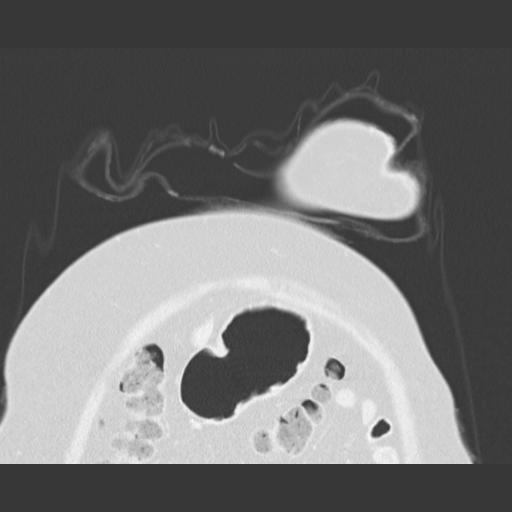
[im 49/121  lung]
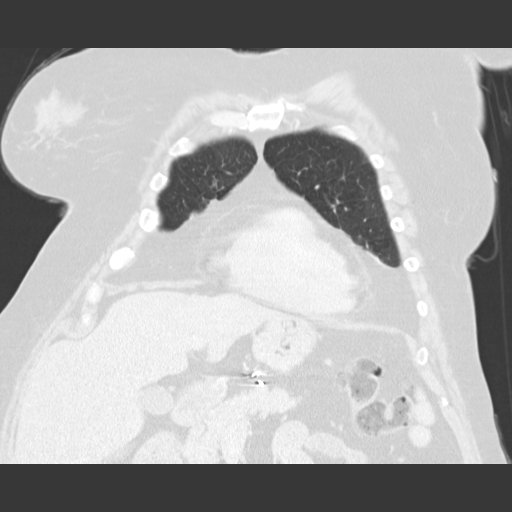
[im 73/121  lung]
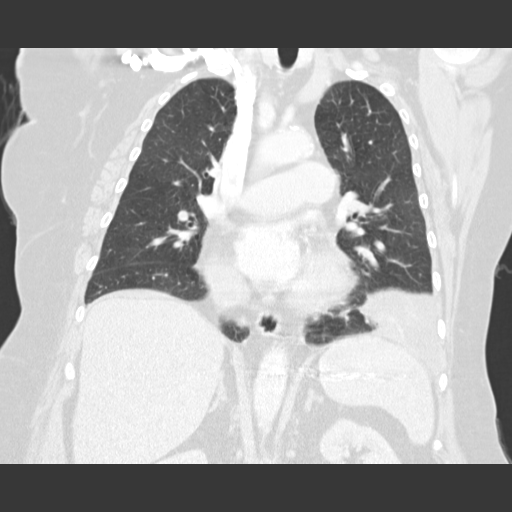

[15 of 36 positions shown; findings below may reference images not displayed]

FINDINGS: Mediastinum/Nodes: Right paratracheal node 0.8 cm in short axis on
image 12 series 2, formerly 0.9 cm by my measurement.

Prevascular node 0.8 cm in short axis on image 17 series 2, formerly
1.0 cm by my measurement.

No significant subcarinal or hilar adenopathy. Mild right heart
prominence. Coronary, aortic arch, and branch vessel atherosclerotic
vascular disease.

Lungs/Pleura: Emphysema.

Consolidation in the left lower lobe anteriorly, with truncation of
air bronchograms, in the vicinity of the prior left lower lobe mass.
Presumably much of this represents radiation pneumonitis although
underlying mass within this consolidation is certainly a
possibility. The region of consolidation measures approximately
by 6.1 by 6.9 cm. This is roughly similar in volume to the prior
exam.

There is some minimal atelectasis anteriorly in the right middle
lobe. No new nodule identified. No pleural effusion.

Upper abdomen: Postoperative findings at the gastroesophageal
junction and upper stomach region.

Musculoskeletal: Thoracic spondylosis.
IMPRESSION: 1. Nine flow 4 by 6.1 by 6.9 cm region of consolidation in the left
lower lobe likely from radiation pneumonitis and obscuring the
regional underlying tumor. Difficult to gauge the size of the
underlying tumor. The area of consolidation is roughly similar in
volume to the prior exam.
2. Emphysema.
3. Upper normal sized mediastinal lymph nodes without overt
adenopathy.
4. Mild right heart prominence.
5. Coronary, aortic arch, and branch vessel atherosclerotic vascular
disease.

## 2018-07-01 IMAGING — CT CT CHEST W/ CM
2 of 4 series · 15 of 36 positions shown, 18 images · IV contrast (iopamidol)
Comparison: 04/24/2015 chest CT.

CLINICAL DATA: Re- stage left lower lobe lung cancer status post
radiation therapy.

EXAM:
CT CHEST WITH CONTRAST
TECHNIQUE: Multidetector CT imaging of the chest was performed during
intravenous contrast administration.
CONTRAST:  60mL [id number] IOPAMIDOL ([id number]) INJECTION 61%

[Series 2: chest with st · axial · 0.98mm/px · z∈[-466,-160]mm · 12 of 177 slices shown, 15 images]
[im 12/177  mediastinal]
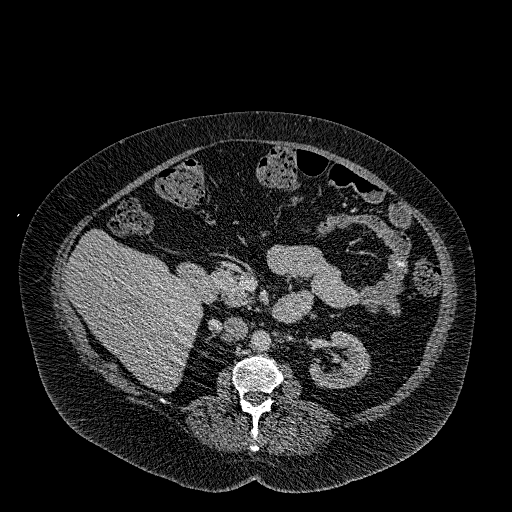
[im 12/177  lung]
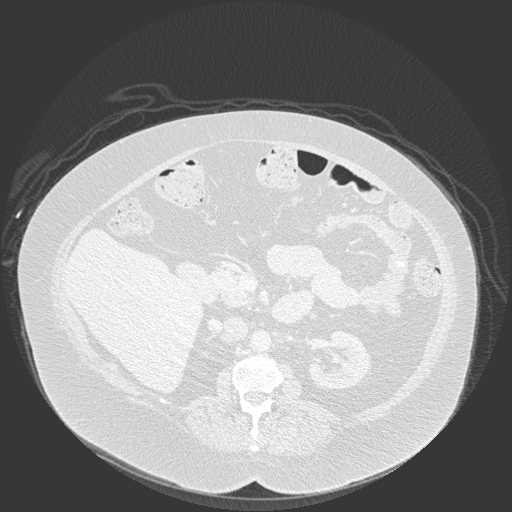
[im 24/177  lung]
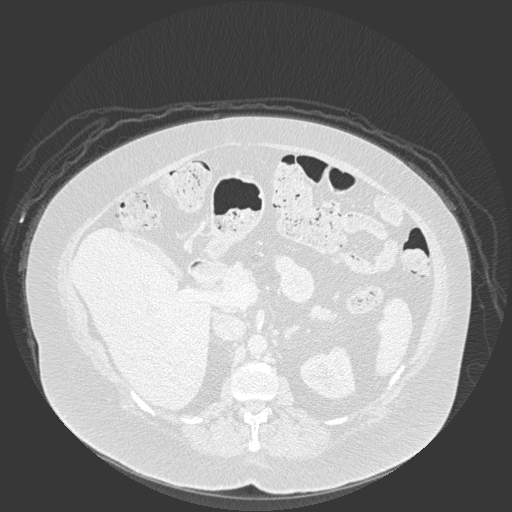
[im 36/177  lung]
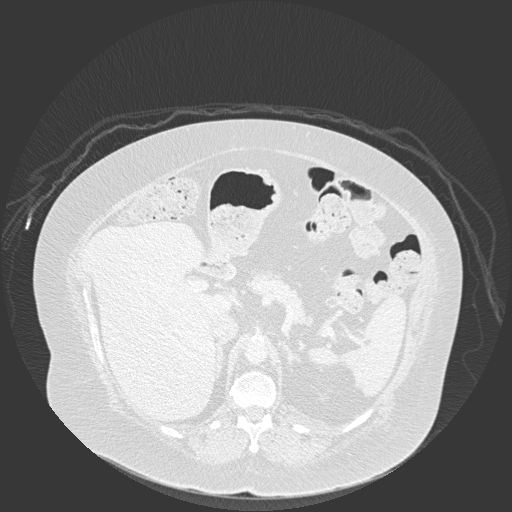
[im 59/177  lung]
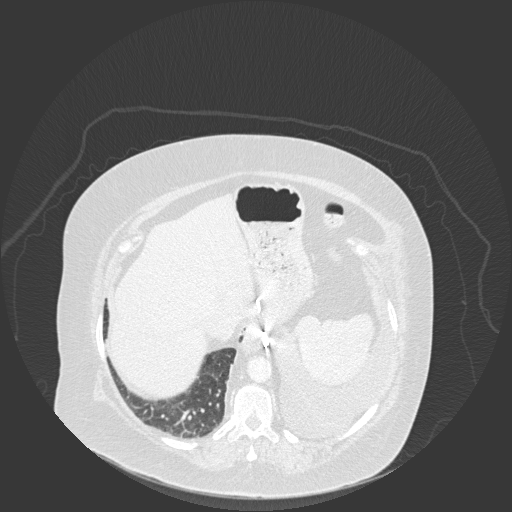
[im 71/177  mediastinal]
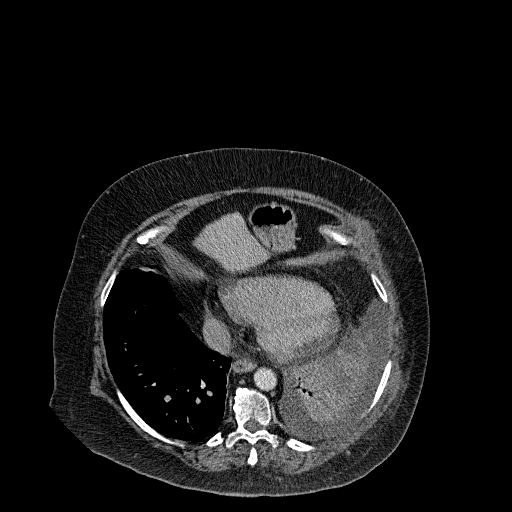
[im 71/177  lung]
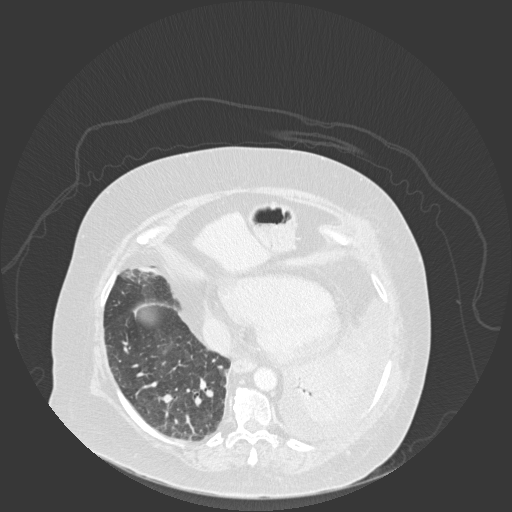
[im 83/177  lung]
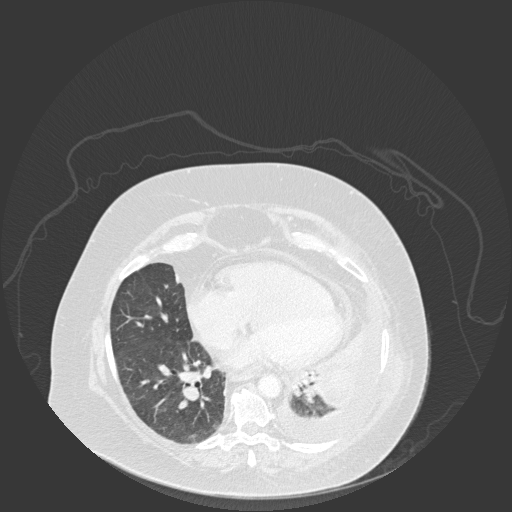
[im 94/177  lung]
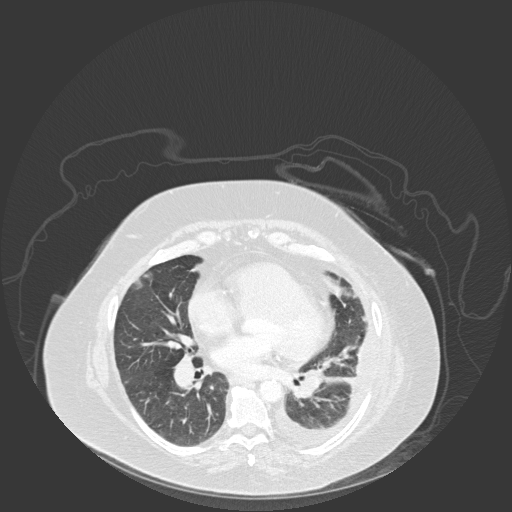
[im 106/177  lung]
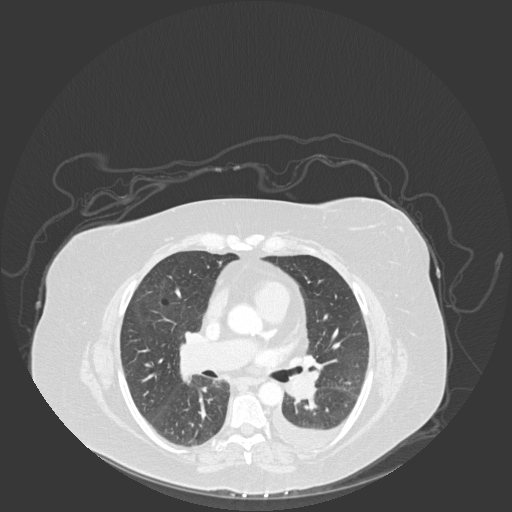
[im 118/177  mediastinal]
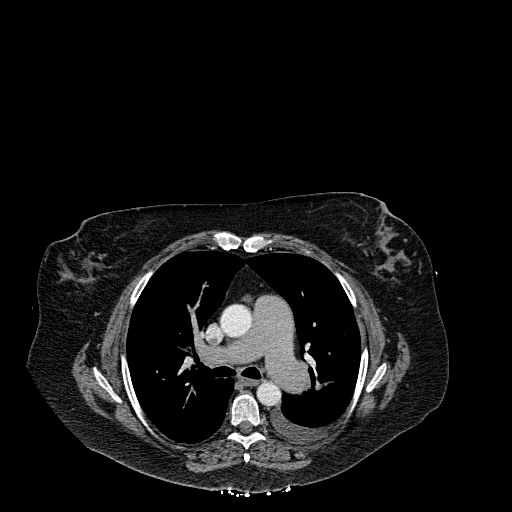
[im 118/177  lung]
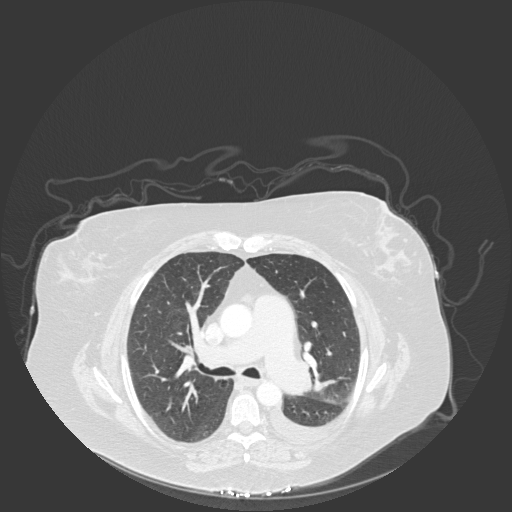
[im 141/177  lung]
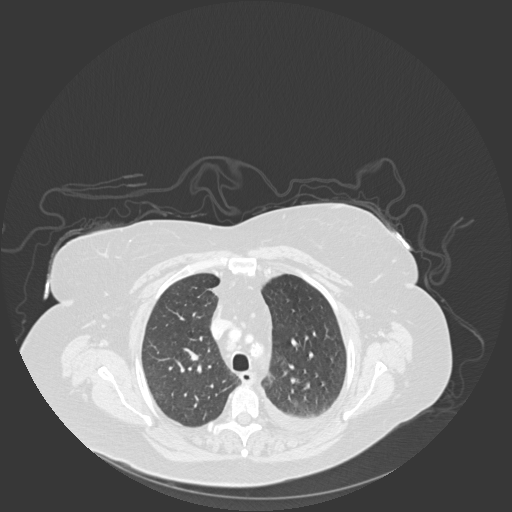
[im 153/177  lung]
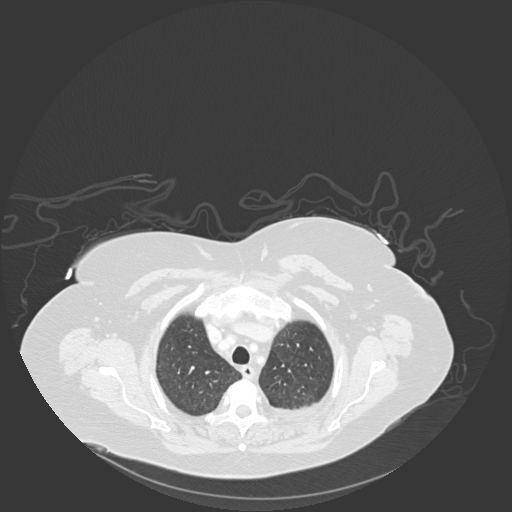
[im 165/177  lung]
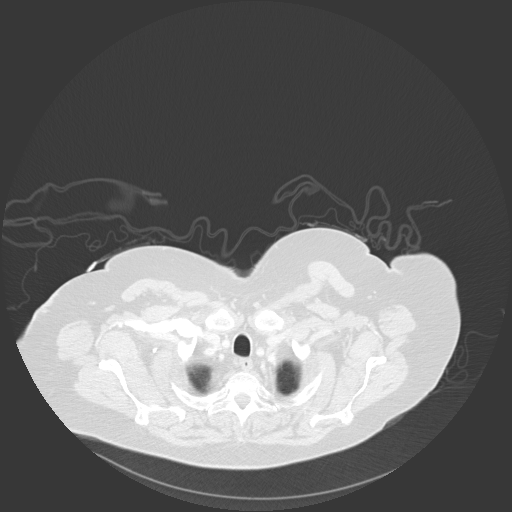

[Series 602: <mpr thick range> · coronal · 0.98mm/px · 3 of 186 slices shown]
[im 38/186  lung]
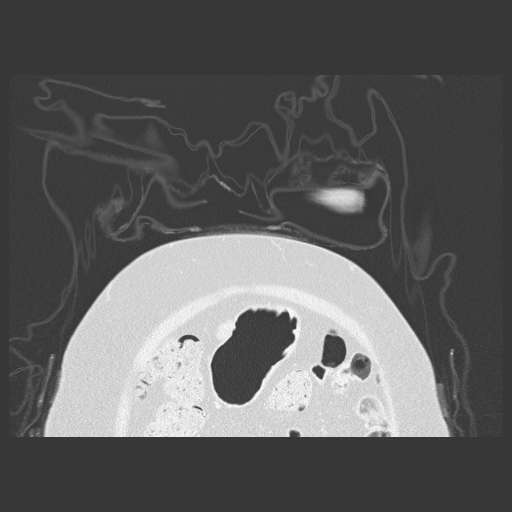
[im 75/186  lung]
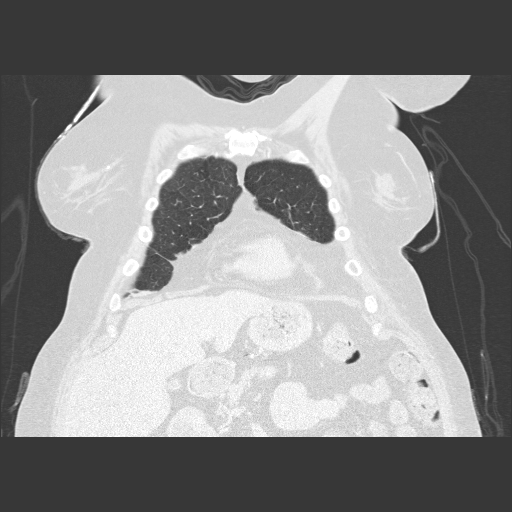
[im 112/186  lung]
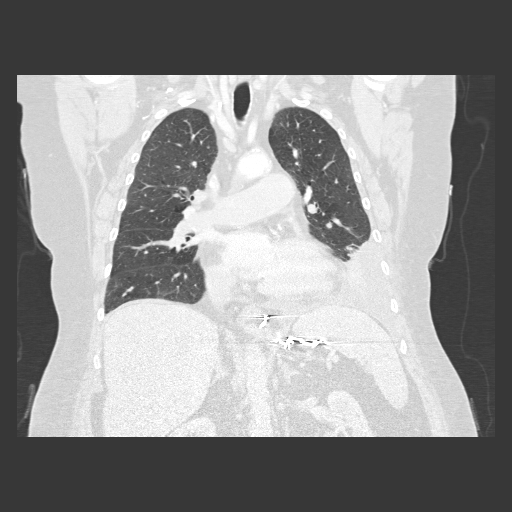

[15 of 36 positions shown; findings below may reference images not displayed]

FINDINGS: Mediastinum/Nodes: Stable mild cardiomegaly. Trace pericardial
effusion/thickening. Left anterior descending, left circumflex and
right coronary atherosclerosis. Atherosclerotic nonaneurysmal
thoracic aorta. Stable dilated main pulmonary artery (3.7 cm
diameter). No central pulmonary emboli. No discrete thyroid nodules.
Unremarkable esophagus. No pathologically enlarged axillary,
mediastinal or hilar lymph nodes. Stable top-normal 0.9 cm right
paratracheal node (series 2/ image 36).

Lungs/Pleura: No pneumothorax. No right pleural effusion. New small
layering left pleural effusion. Mild centrilobular and paraseptal
emphysema. Stable mosaic attenuation throughout both lungs. There is
masslike fibrosis in the peripheral left lower lobe and inferior
segment of the lingula measuring 9.0 x 5.7 cm in maximum axial
dimensions (series 5/ image 106), previously 9.4 x 6.1 cm, slightly
decreased. There is worsened volume loss in the left lower lobe. No
acute consolidative airspace disease or new significant pulmonary
nodules.

Upper abdomen: Postsurgical changes from Nissen fundoplication.

Musculoskeletal: No aggressive appearing focal osseous lesions.
Mild-to-moderate thoracic spondylosis.
IMPRESSION: 1. Evolving masslike radiation fibrosis in the left lower lobe, with
no specific findings to suggest local tumor recurrence.
2. No evidence of metastatic disease in the chest.
3. New small left pleural effusion and trace pericardial
effusion/thickening.
4. Stable dilated main pulmonary artery suggesting chronic pulmonary
hypertension. Stable mosaic attenuation throughout the lungs, which
could be due to mosaic perfusion from pulmonary vascular disease
and/or air trapping.
5. Additional findings include mild cardiomegaly, aortic
atherosclerosis and three-vessel coronary atherosclerosis.

## 2018-09-10 IMAGING — DX DG CHEST 1V PORT
2 series · 2 of 2 positions shown · non-contrast
Comparison: CT dated 11/25/2015.  Radiographs dated 05/04/2015.

CLINICAL DATA: Shortness of breath.

EXAM:
PORTABLE CHEST 1 VIEW

[chest ap (1 of 2)]
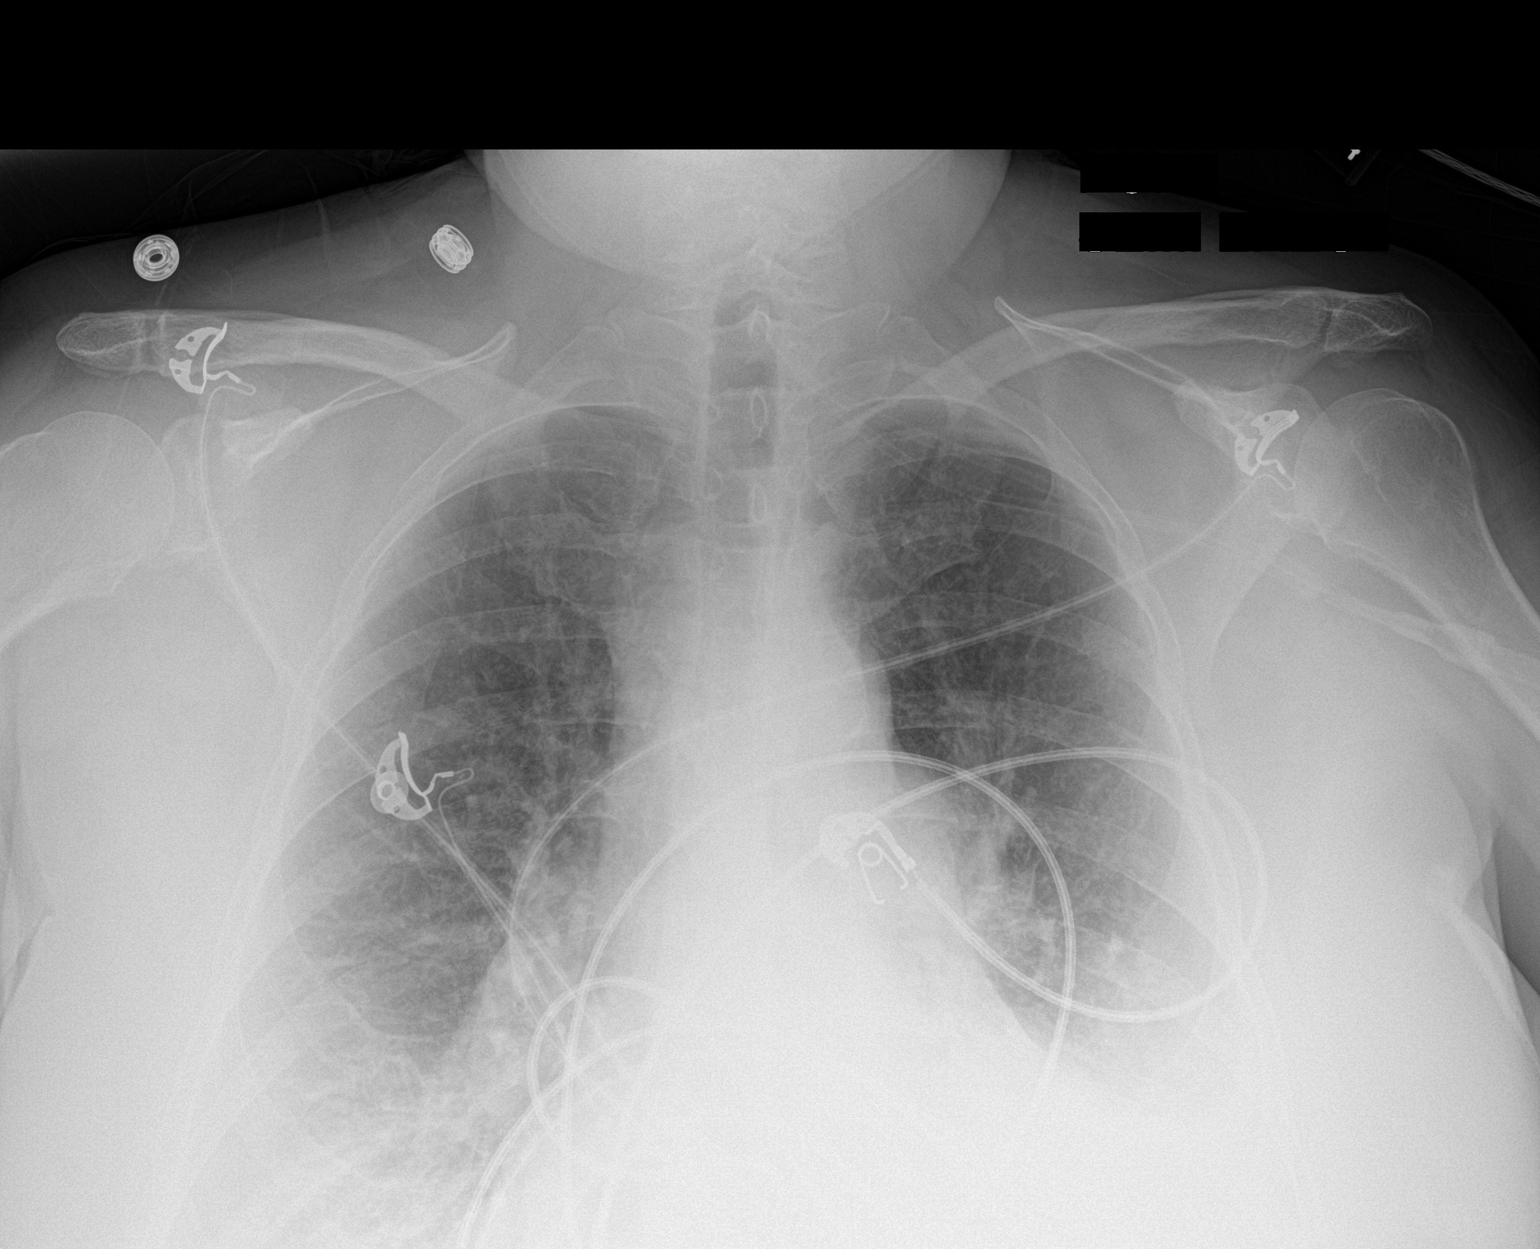

[chest ap (2 of 2)]
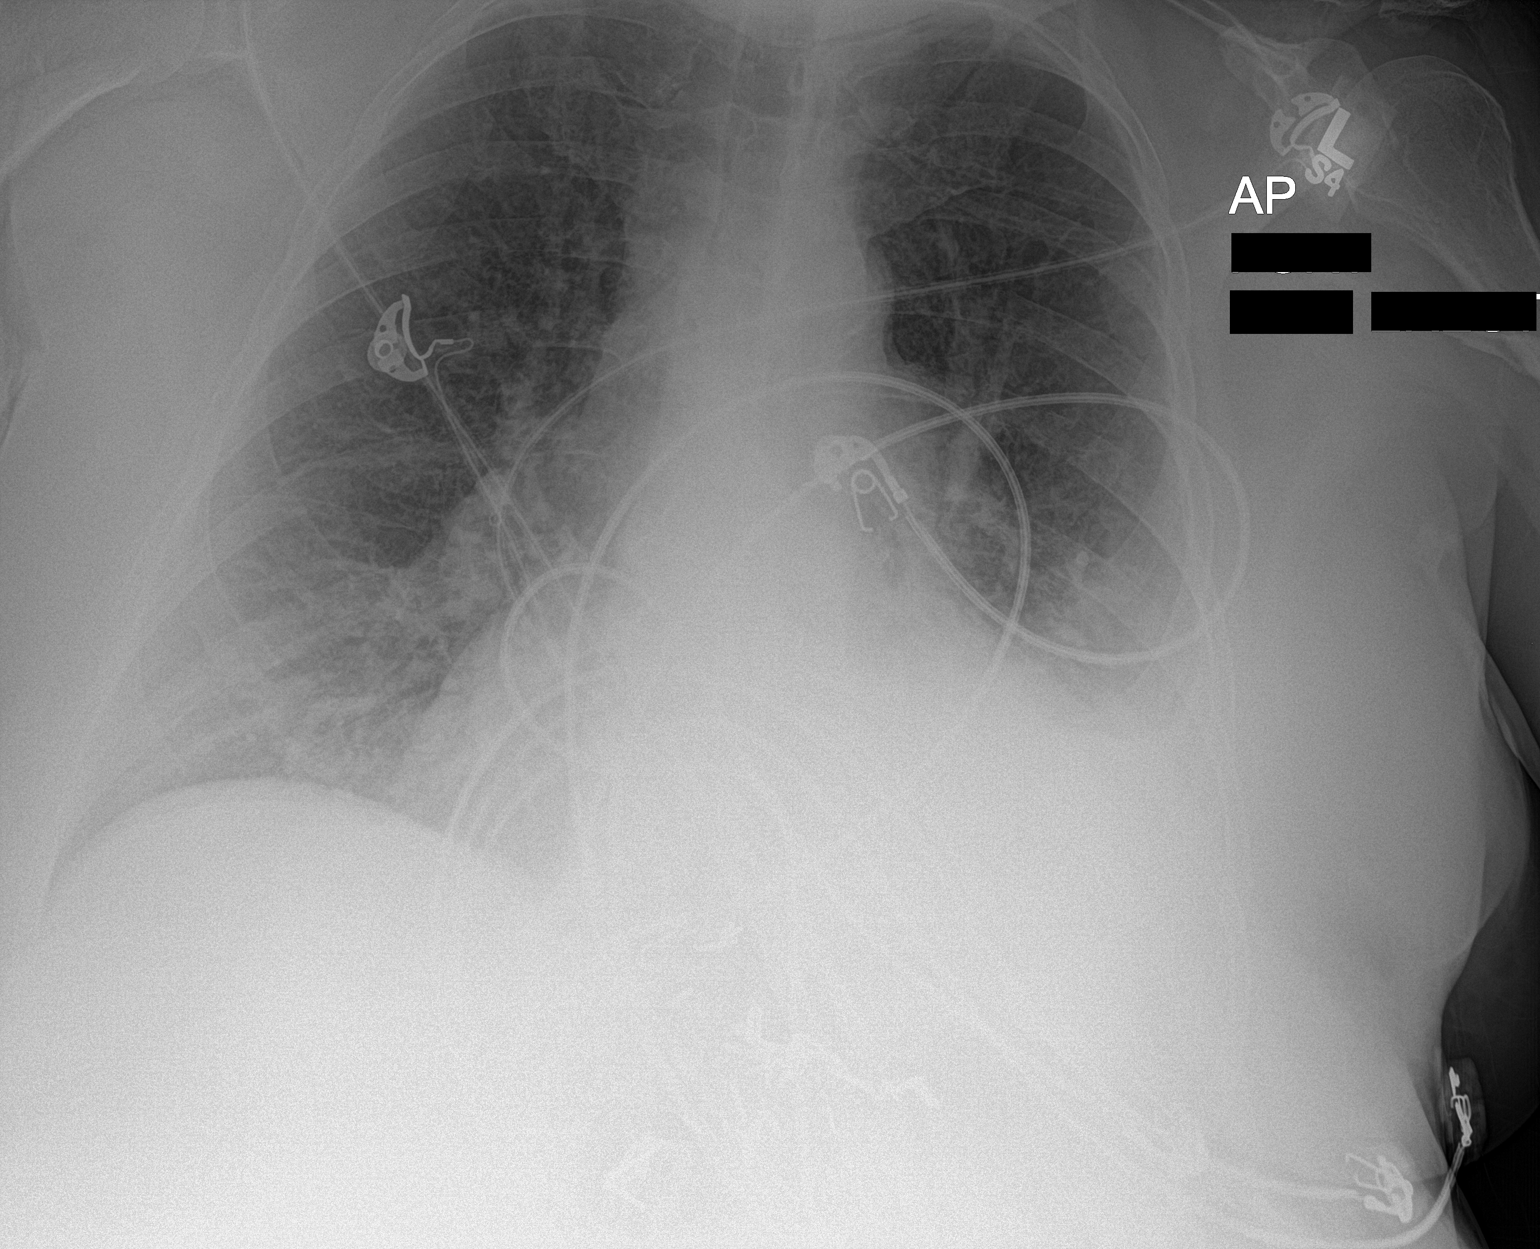

[2 of 2 positions shown; findings below may reference images not displayed]

FINDINGS: Stable enlarged cardiac silhouette. Increased left pleural fluid and
left basilar atelectasis. The pulmonary vasculature and interstitial
markings remain prominent. Diffuse osteopenia.
IMPRESSION: 1. Moderate-sized left pleural effusion, increased.
2. Mildly increased left basilar atelectasis.
3. Stable cardiomegaly, pulmonary vascular congestion, chronic
interstitial lung disease and probable mild interstitial pulmonary
edema.

## 2018-09-11 IMAGING — DX DG CHEST 2V
2 series · 2 of 2 positions shown · non-contrast
Comparison: February 04, 2016

CLINICAL DATA: Shortness of breath with cough for 1 week

EXAM:
CHEST  2 VIEW

[chest pa]
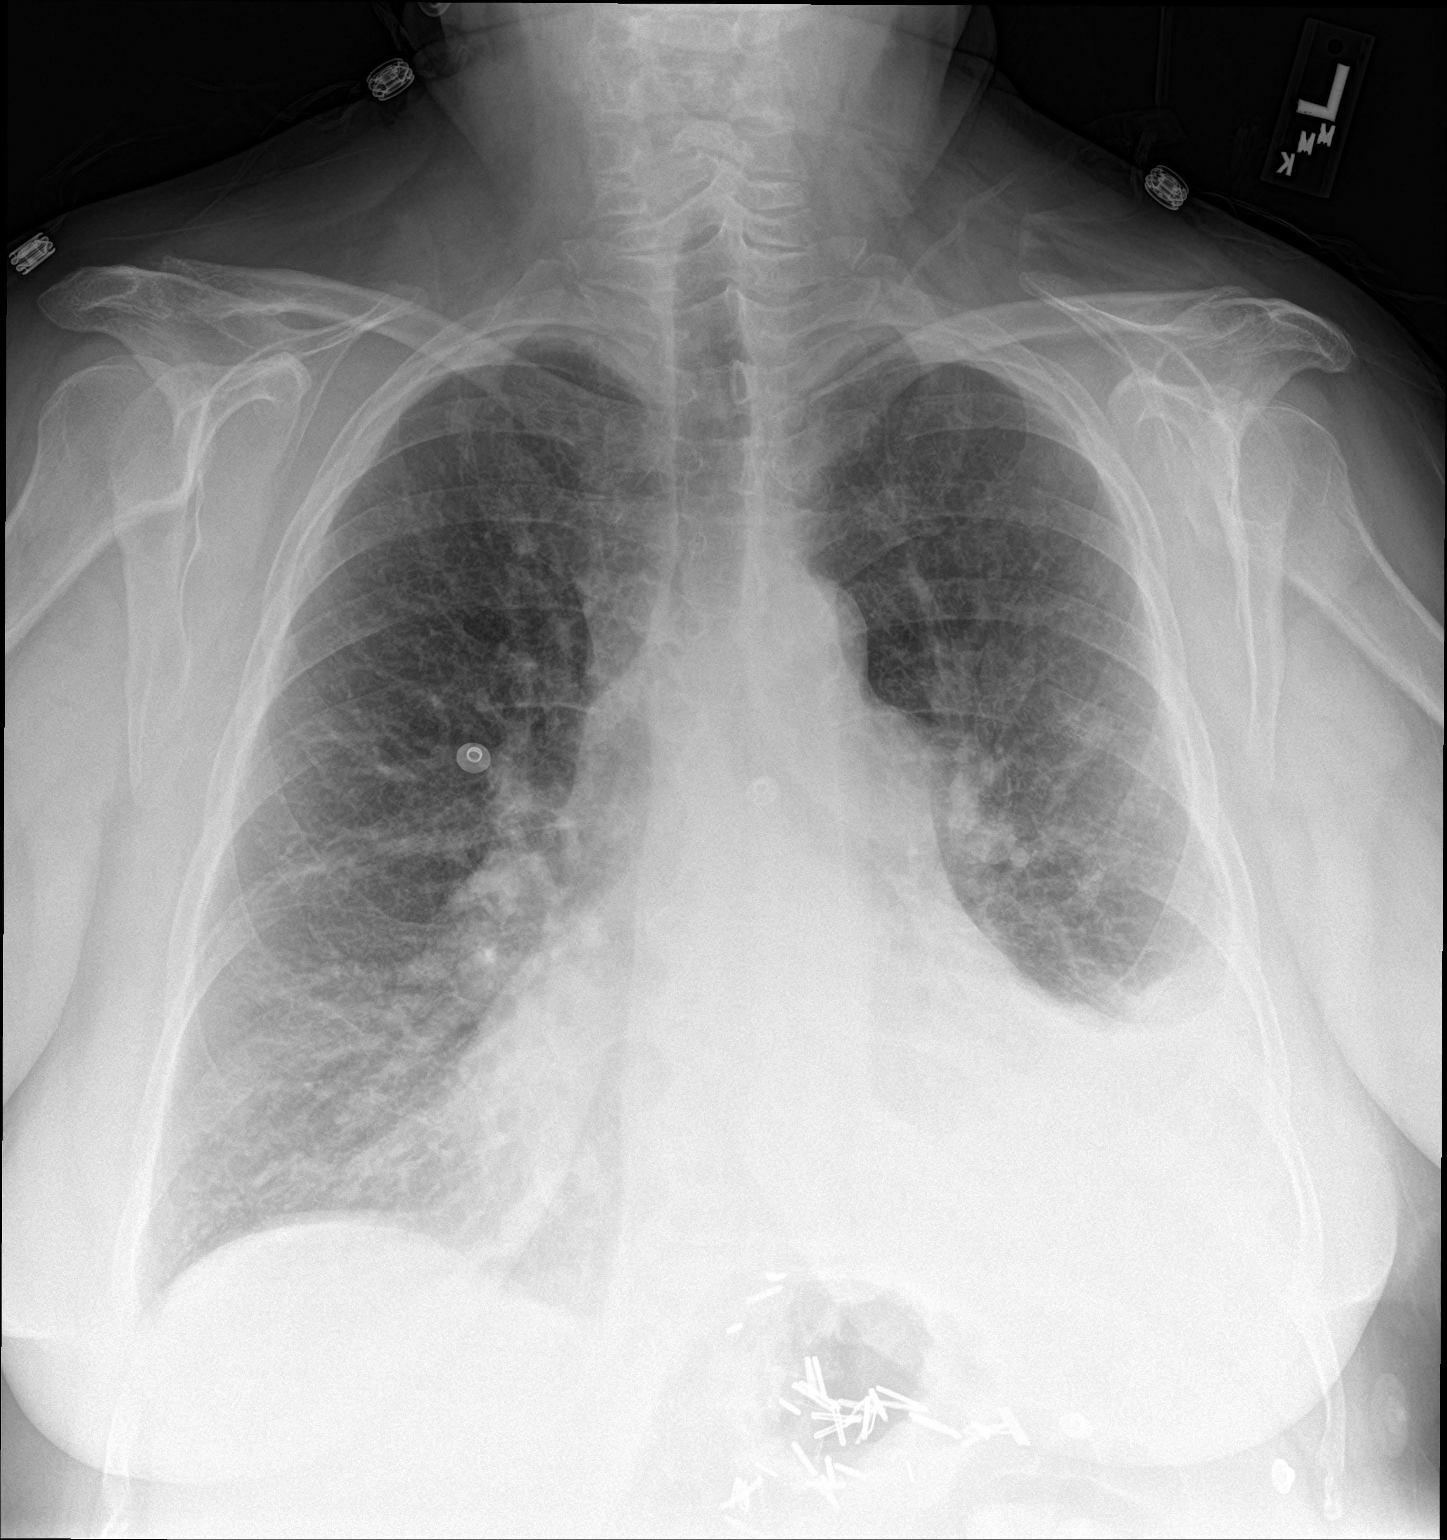

[chest lat]
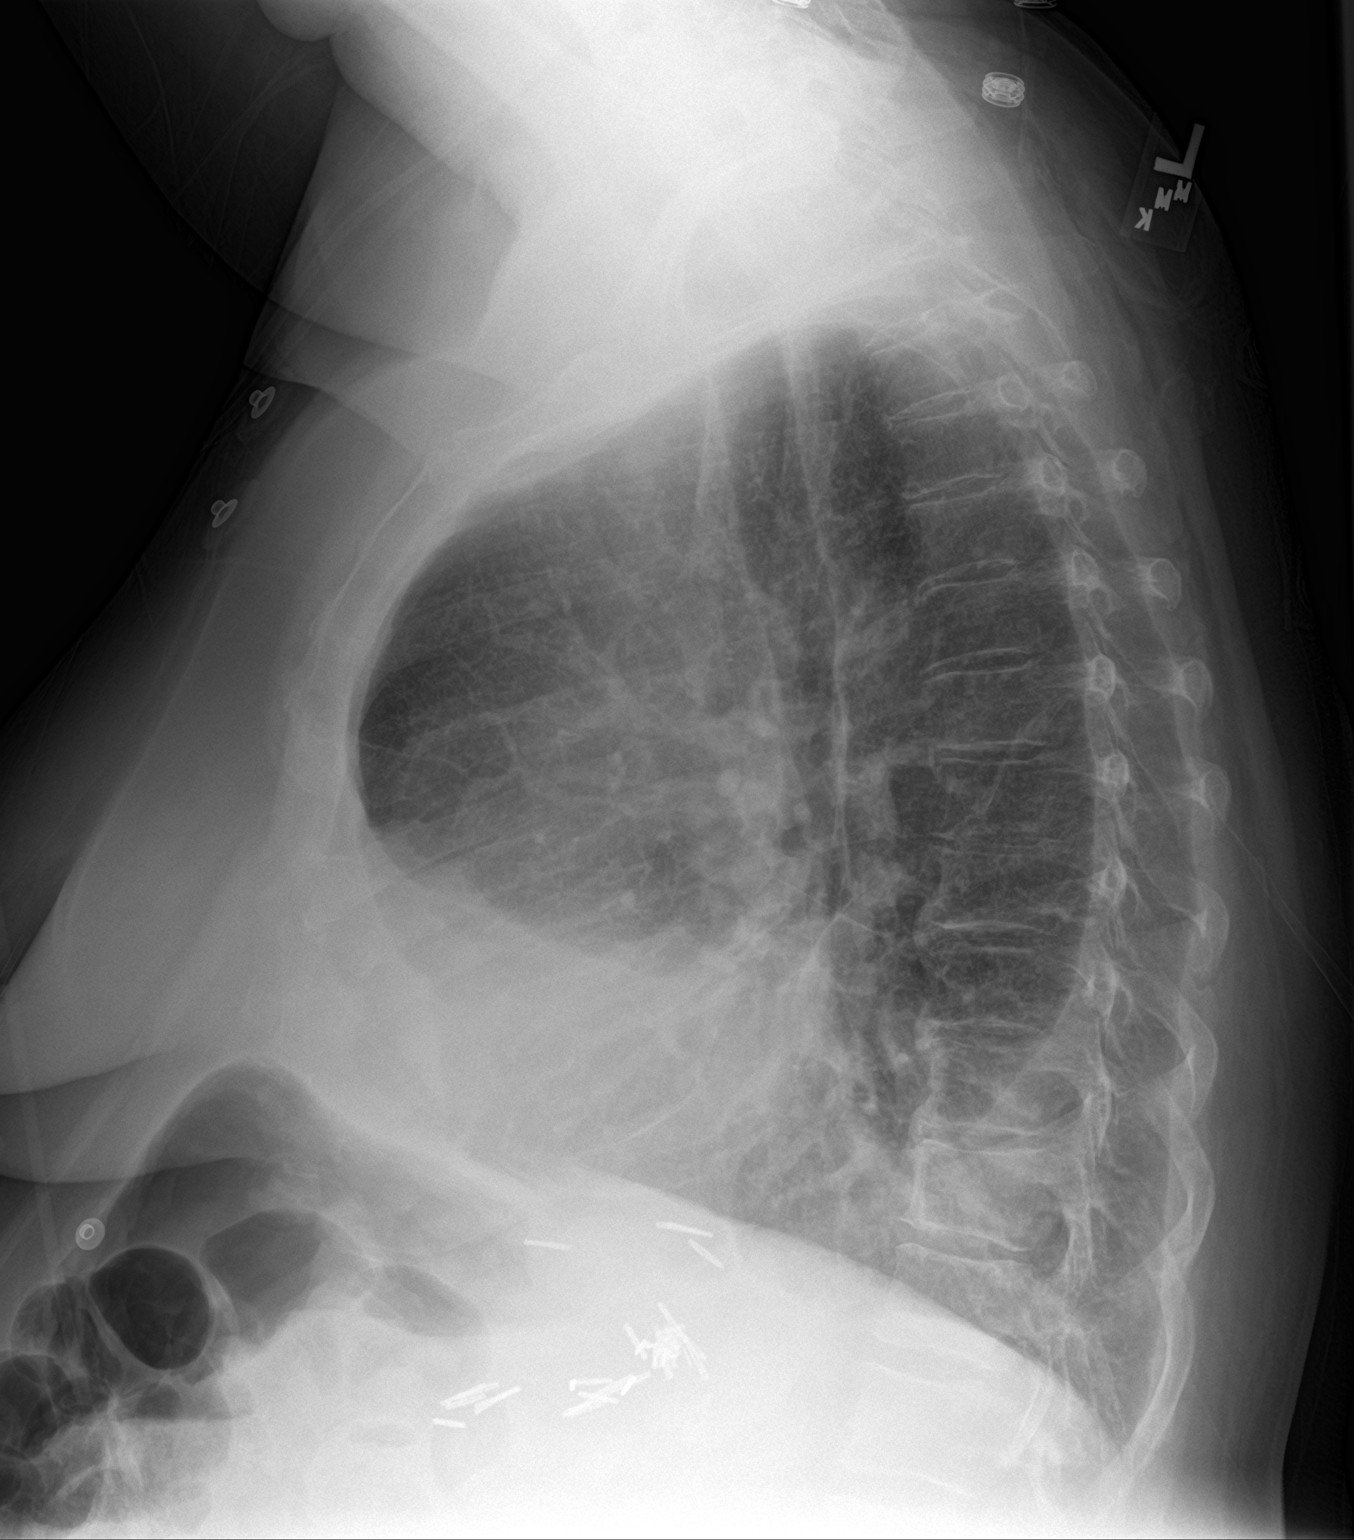

[2 of 2 positions shown; findings below may reference images not displayed]

FINDINGS: There is a persistent left pleural effusion with consolidation in
the inferior lingula and portions of the left lower lobe. There is
mild atelectasis in the medial right base. Lungs elsewhere clear.
Heart is upper normal in size with pulmonary vascularity within
normal limits. No adenopathy. There is atherosclerotic calcification
in the aorta. No adenopathy. No bone lesions.
IMPRESSION: Persistent left pleural effusion with consolidation in portions of
the lingula and left lower lobe. Mild right base atelectasis. Stable
cardiac silhouette. There is aortic atherosclerosis.

## 2018-09-12 IMAGING — DX DG CHEST 1V
1 series · 1 of 1 positions shown · non-contrast
Comparison: 02/05/2016

CLINICAL DATA: Status post left thoracentesis

EXAM:
CHEST 1 VIEW

[chest pa]
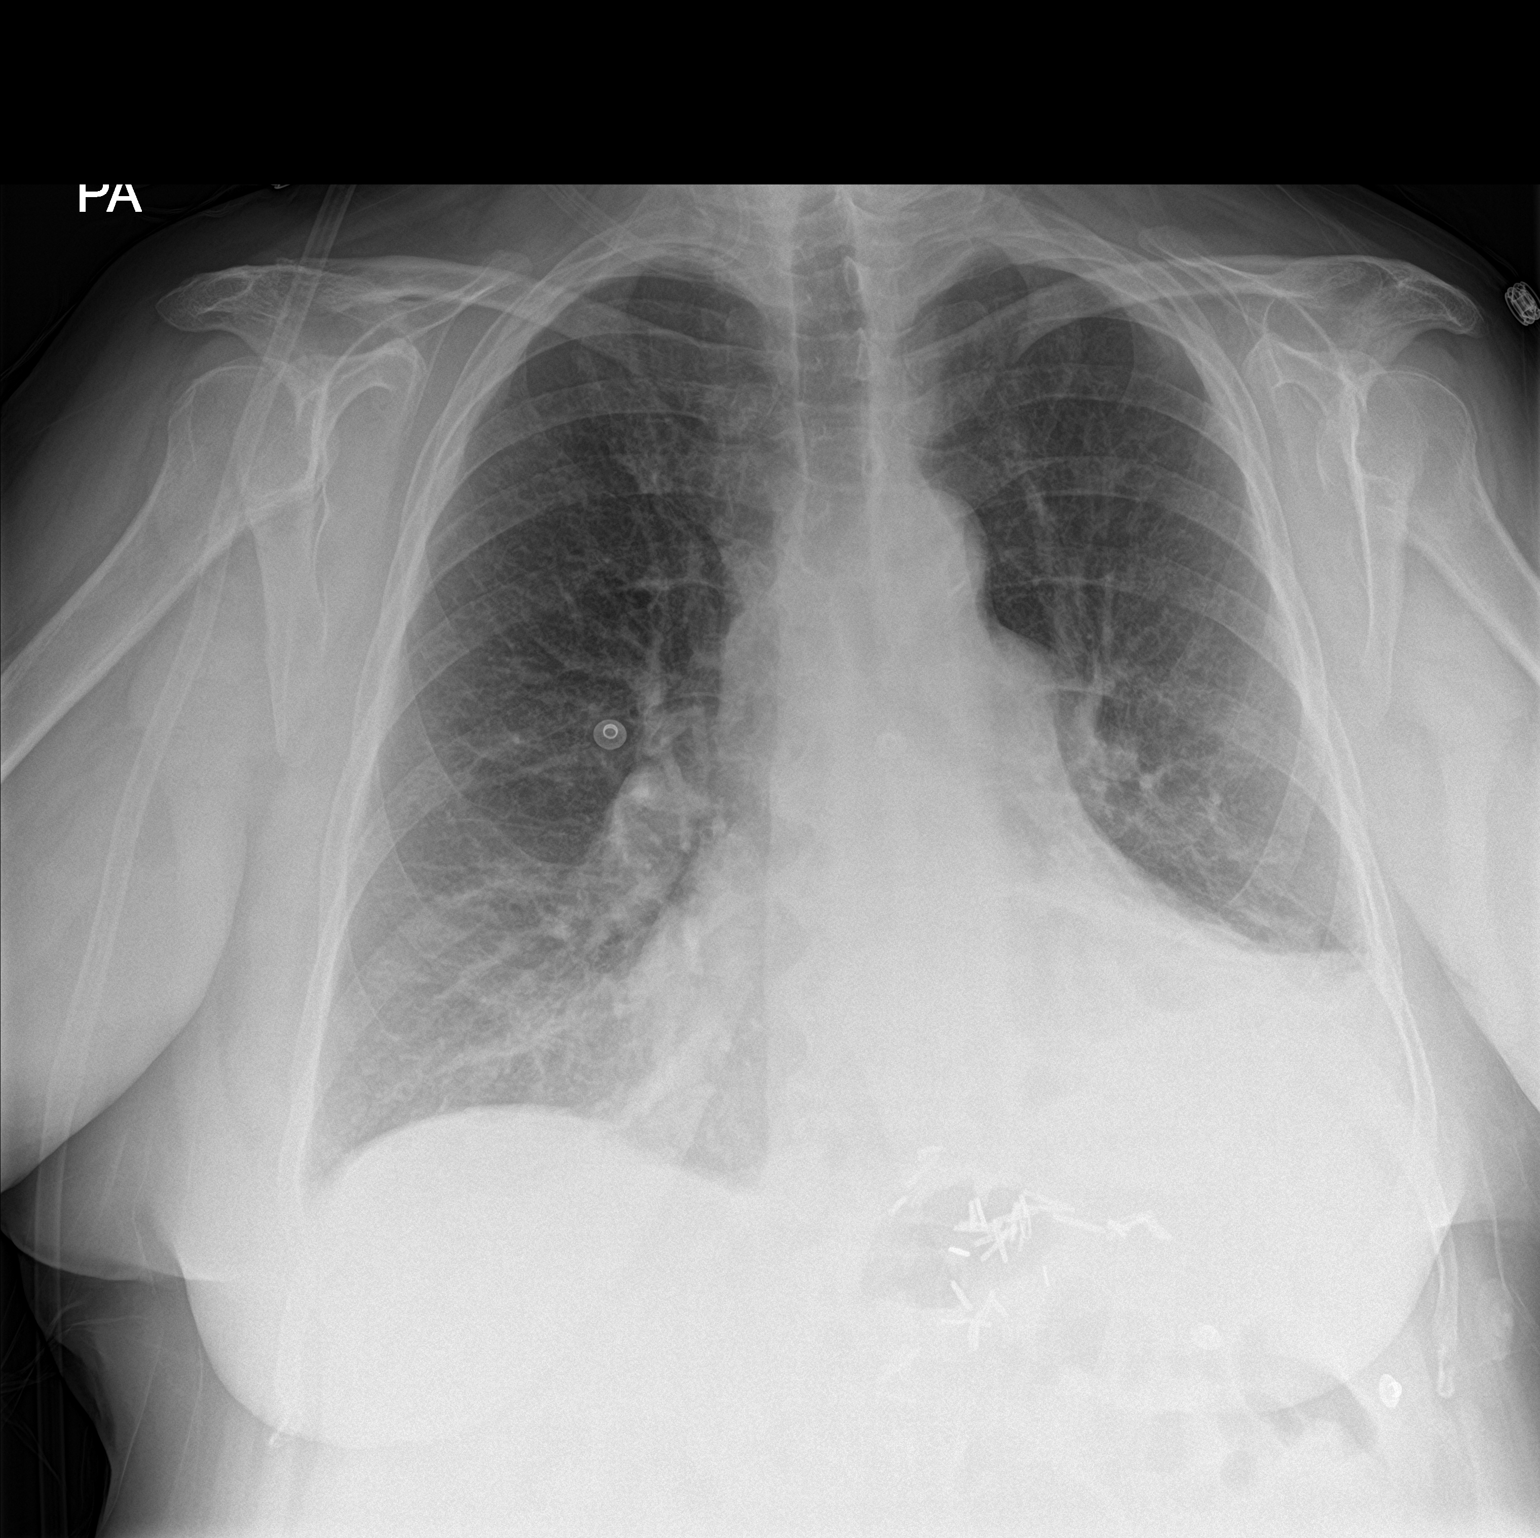

[1 of 1 positions shown; findings below may reference images not displayed]

FINDINGS: Cardiac shadow is enlarged and stable. The right lung remains clear.
There is been some reduction and left pleural effusion. No
pneumothorax is noted. Persistent atelectatic lung is noted in the
left base.
IMPRESSION: No pneumothorax following thoracentesis on the left. Persistent left
basilar atelectasis is noted.

## 2018-11-20 IMAGING — CR DG ABD PORTABLE 2V
3 series · 3 of 3 positions shown · non-contrast
Comparison: None.

CLINICAL DATA: Constipation, shortness of breath

EXAM:
PORTABLE ABDOMEN - 2 VIEW

[AP]
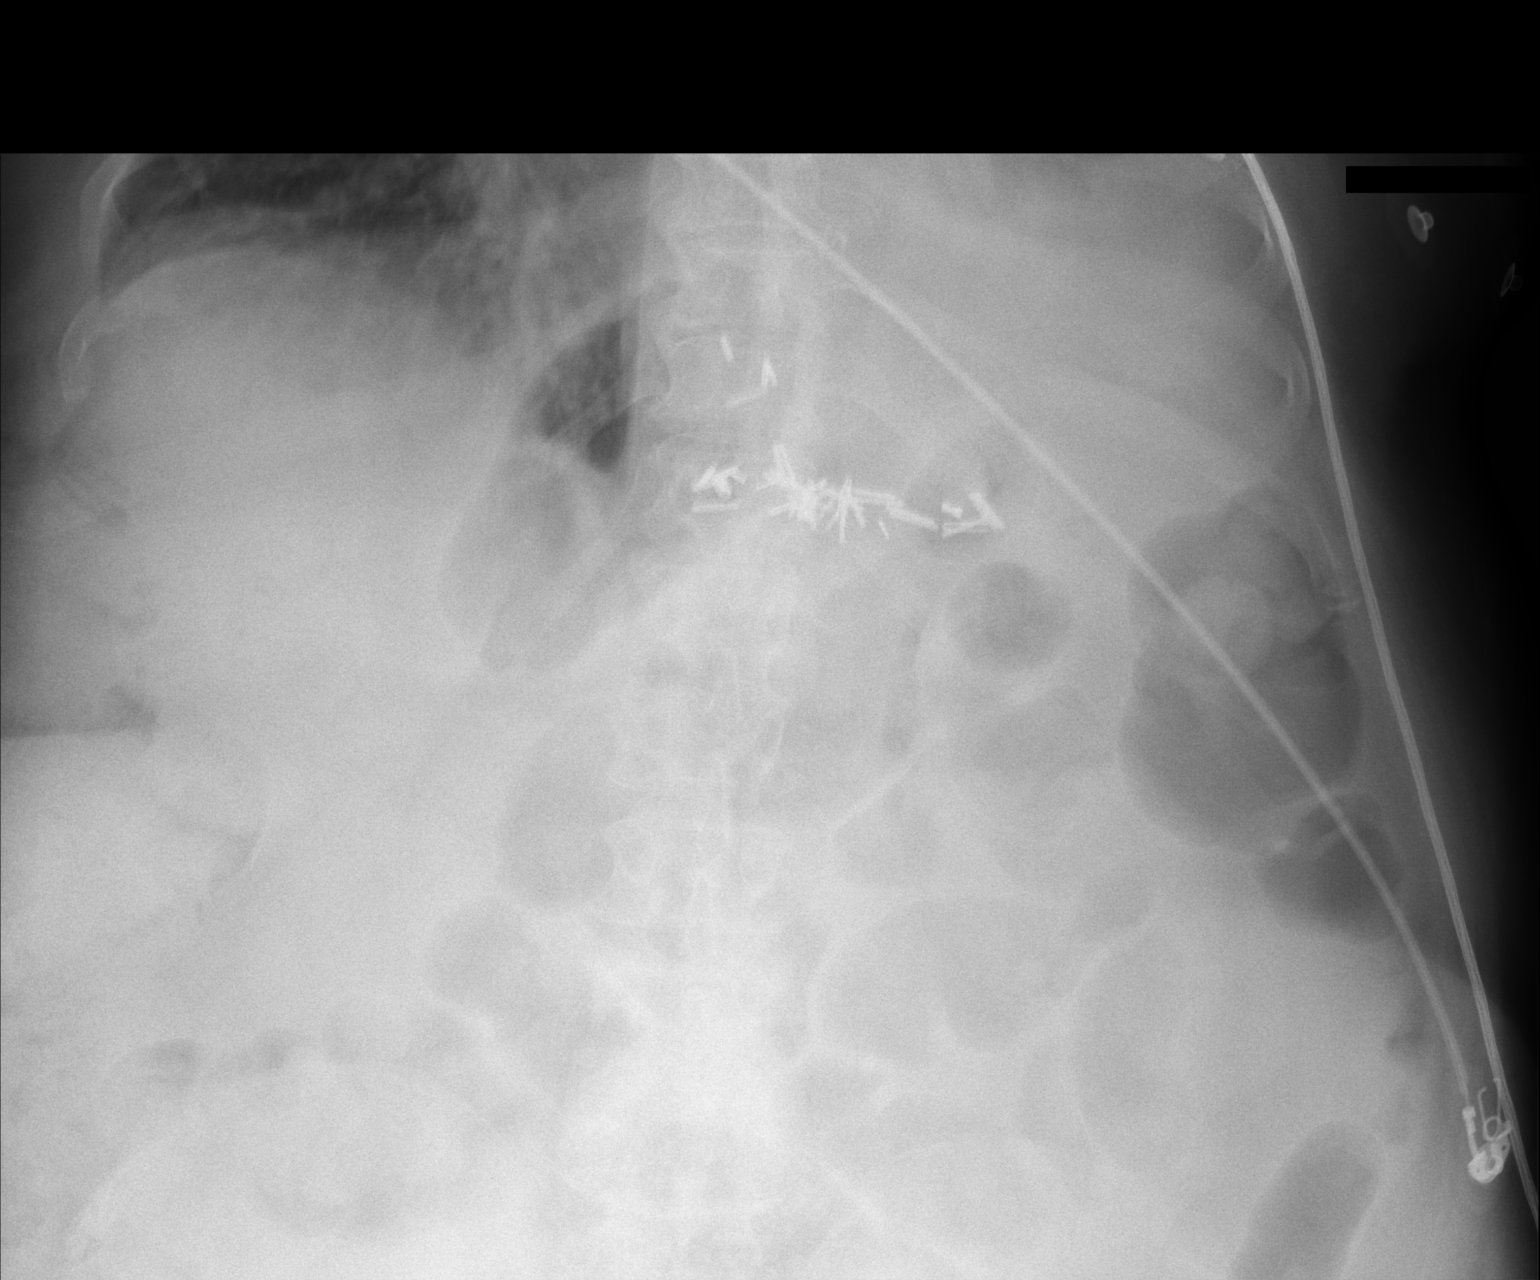

[ap lld]
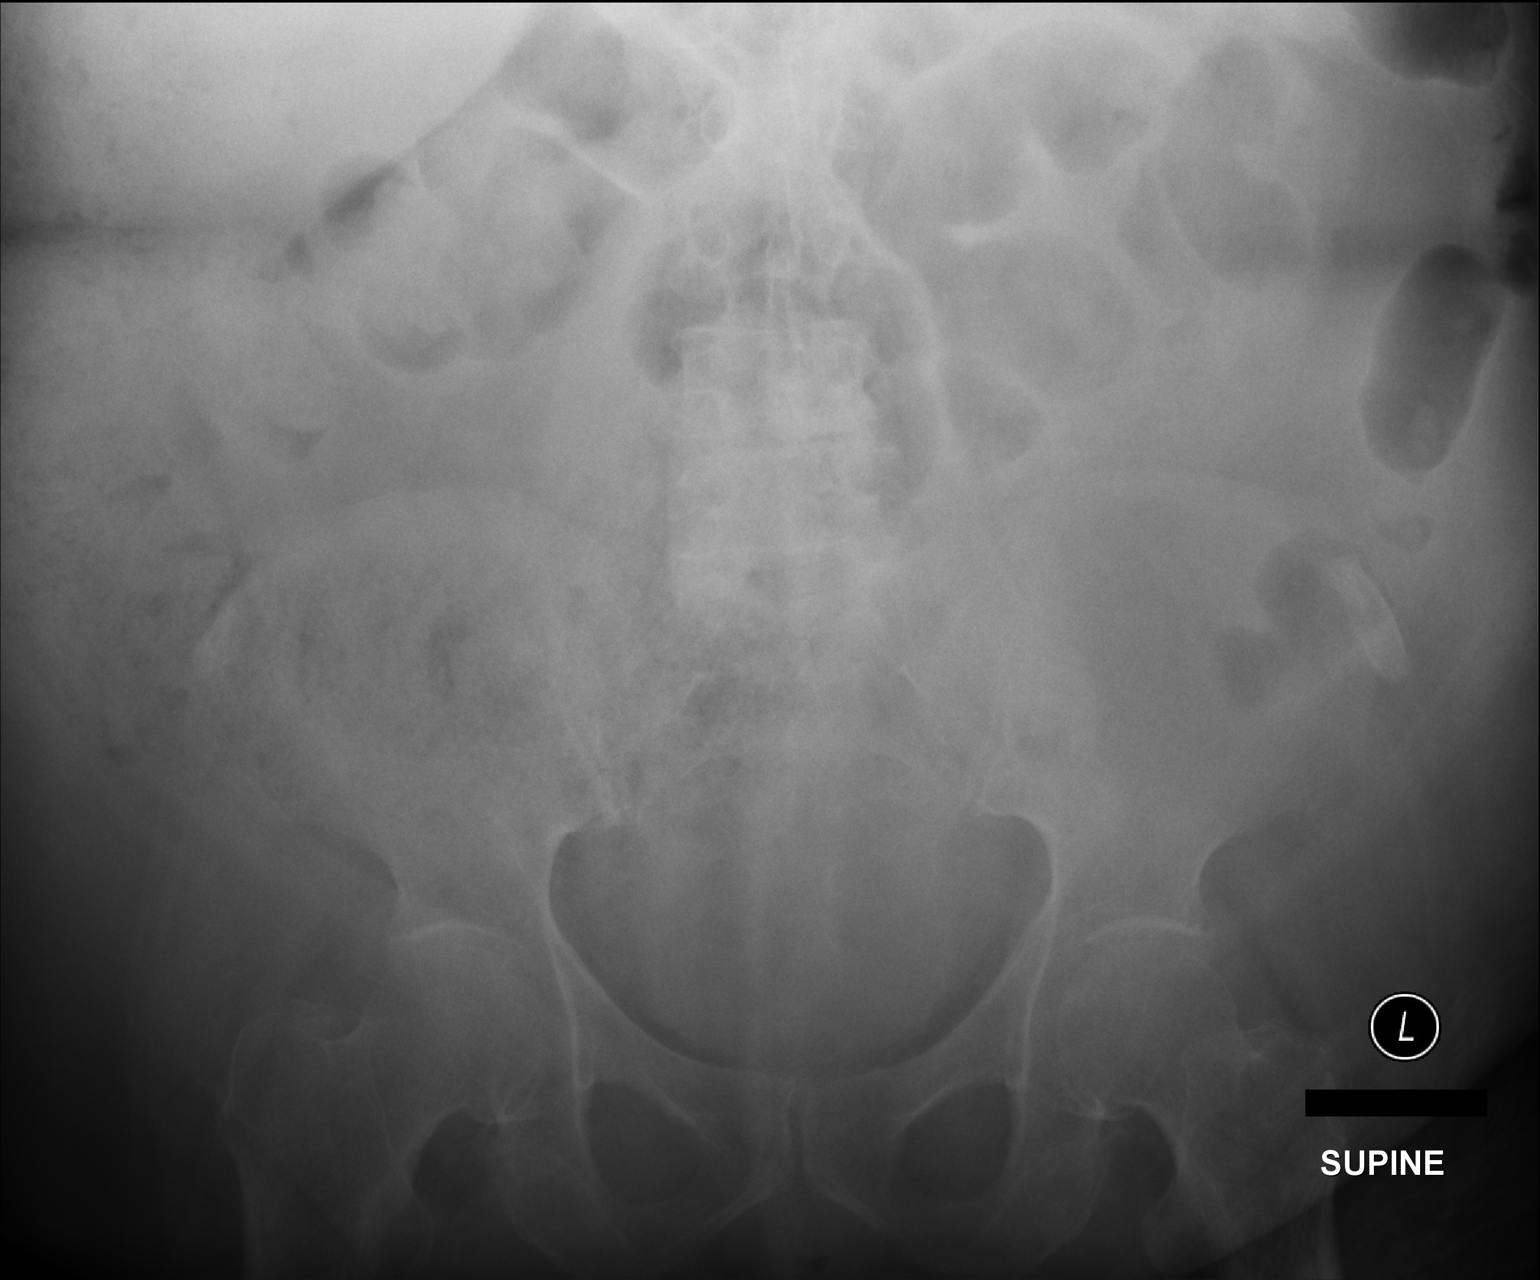

[pa lld]
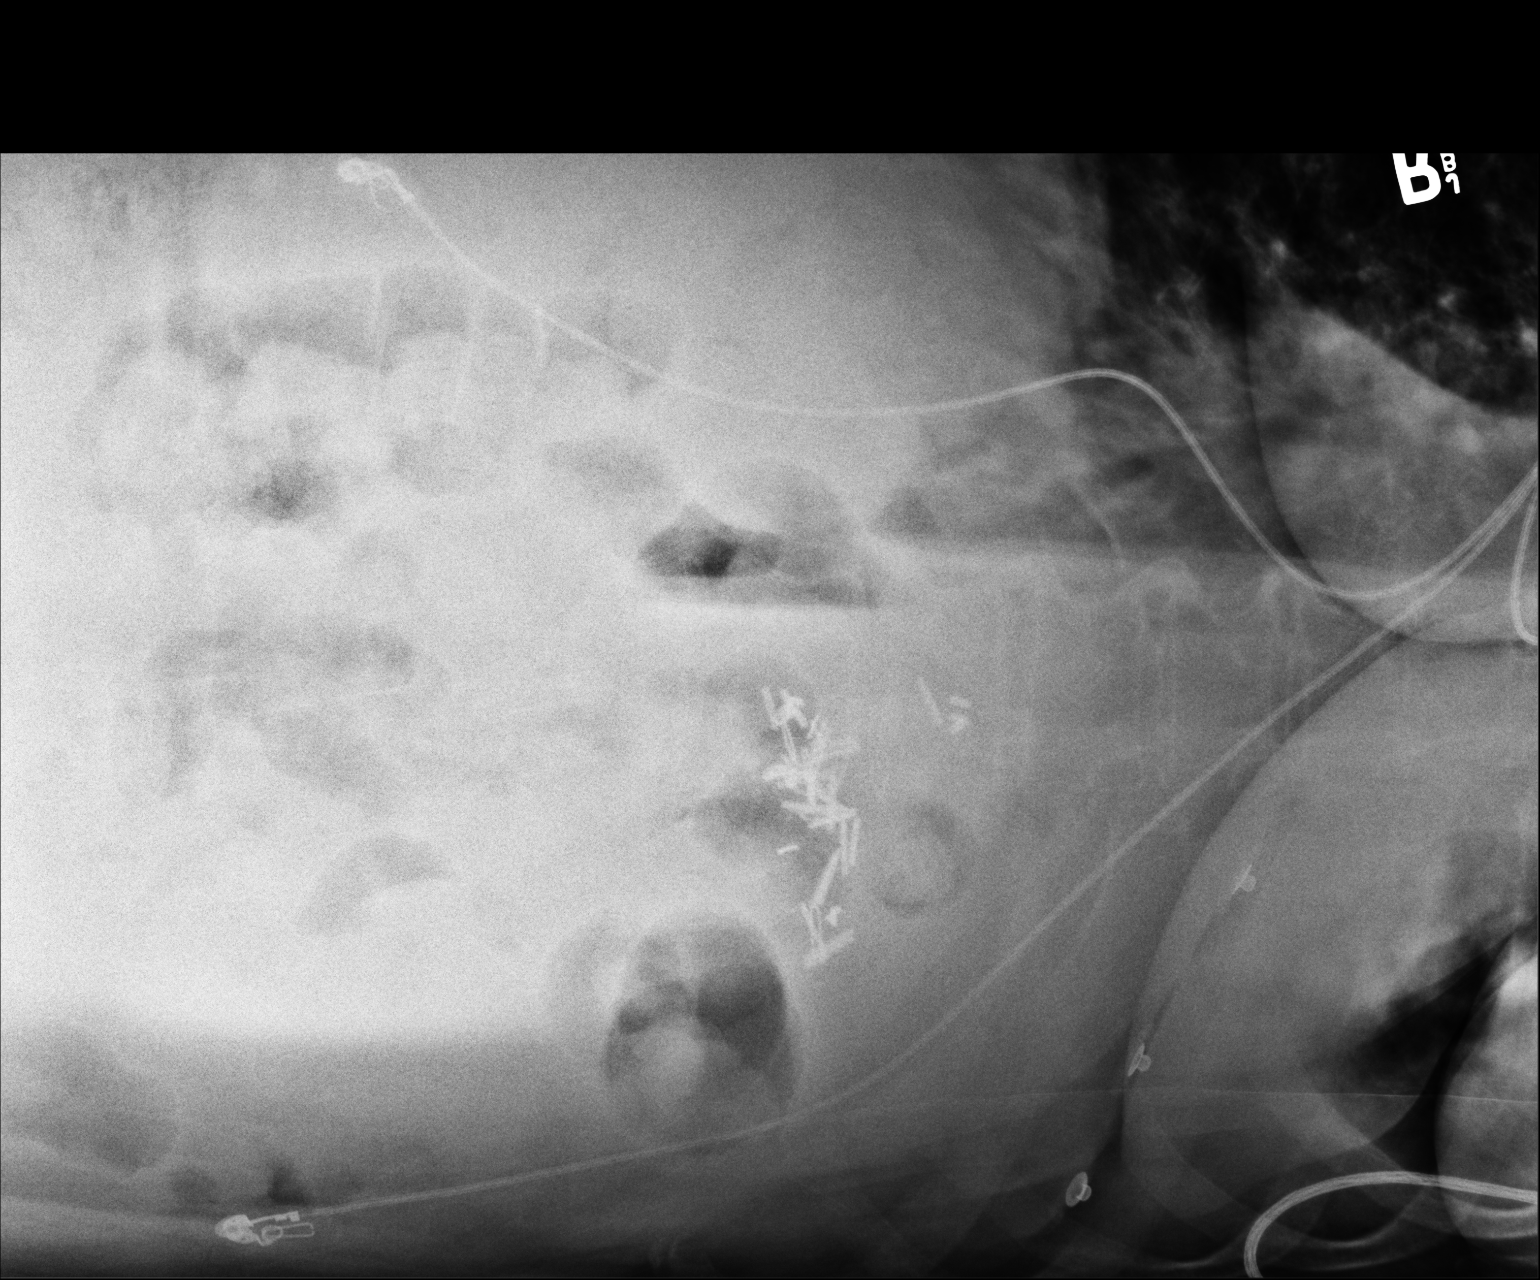

[3 of 3 positions shown; findings below may reference images not displayed]

FINDINGS: There is a large amount of stool in the ascending colon. There is no
bowel dilatation to suggest obstruction. There is no evidence of
pneumoperitoneum, portal venous gas or pneumatosis.

There are no pathologic calcifications along the expected course of
the ureters.

The osseous structures are unremarkable.
IMPRESSION: 1. Large amount of stool in the ascending colon.

## 2018-11-20 IMAGING — DX DG CHEST 2V
2 series · 2 of 2 positions shown · non-contrast
Comparison: 02/06/2016

CLINICAL DATA: Shortness of breath with exertion. Chest pain.
Increasing home oxygen over the past few days. Wheezing.

EXAM:
CHEST  2 VIEW

[chest pa]
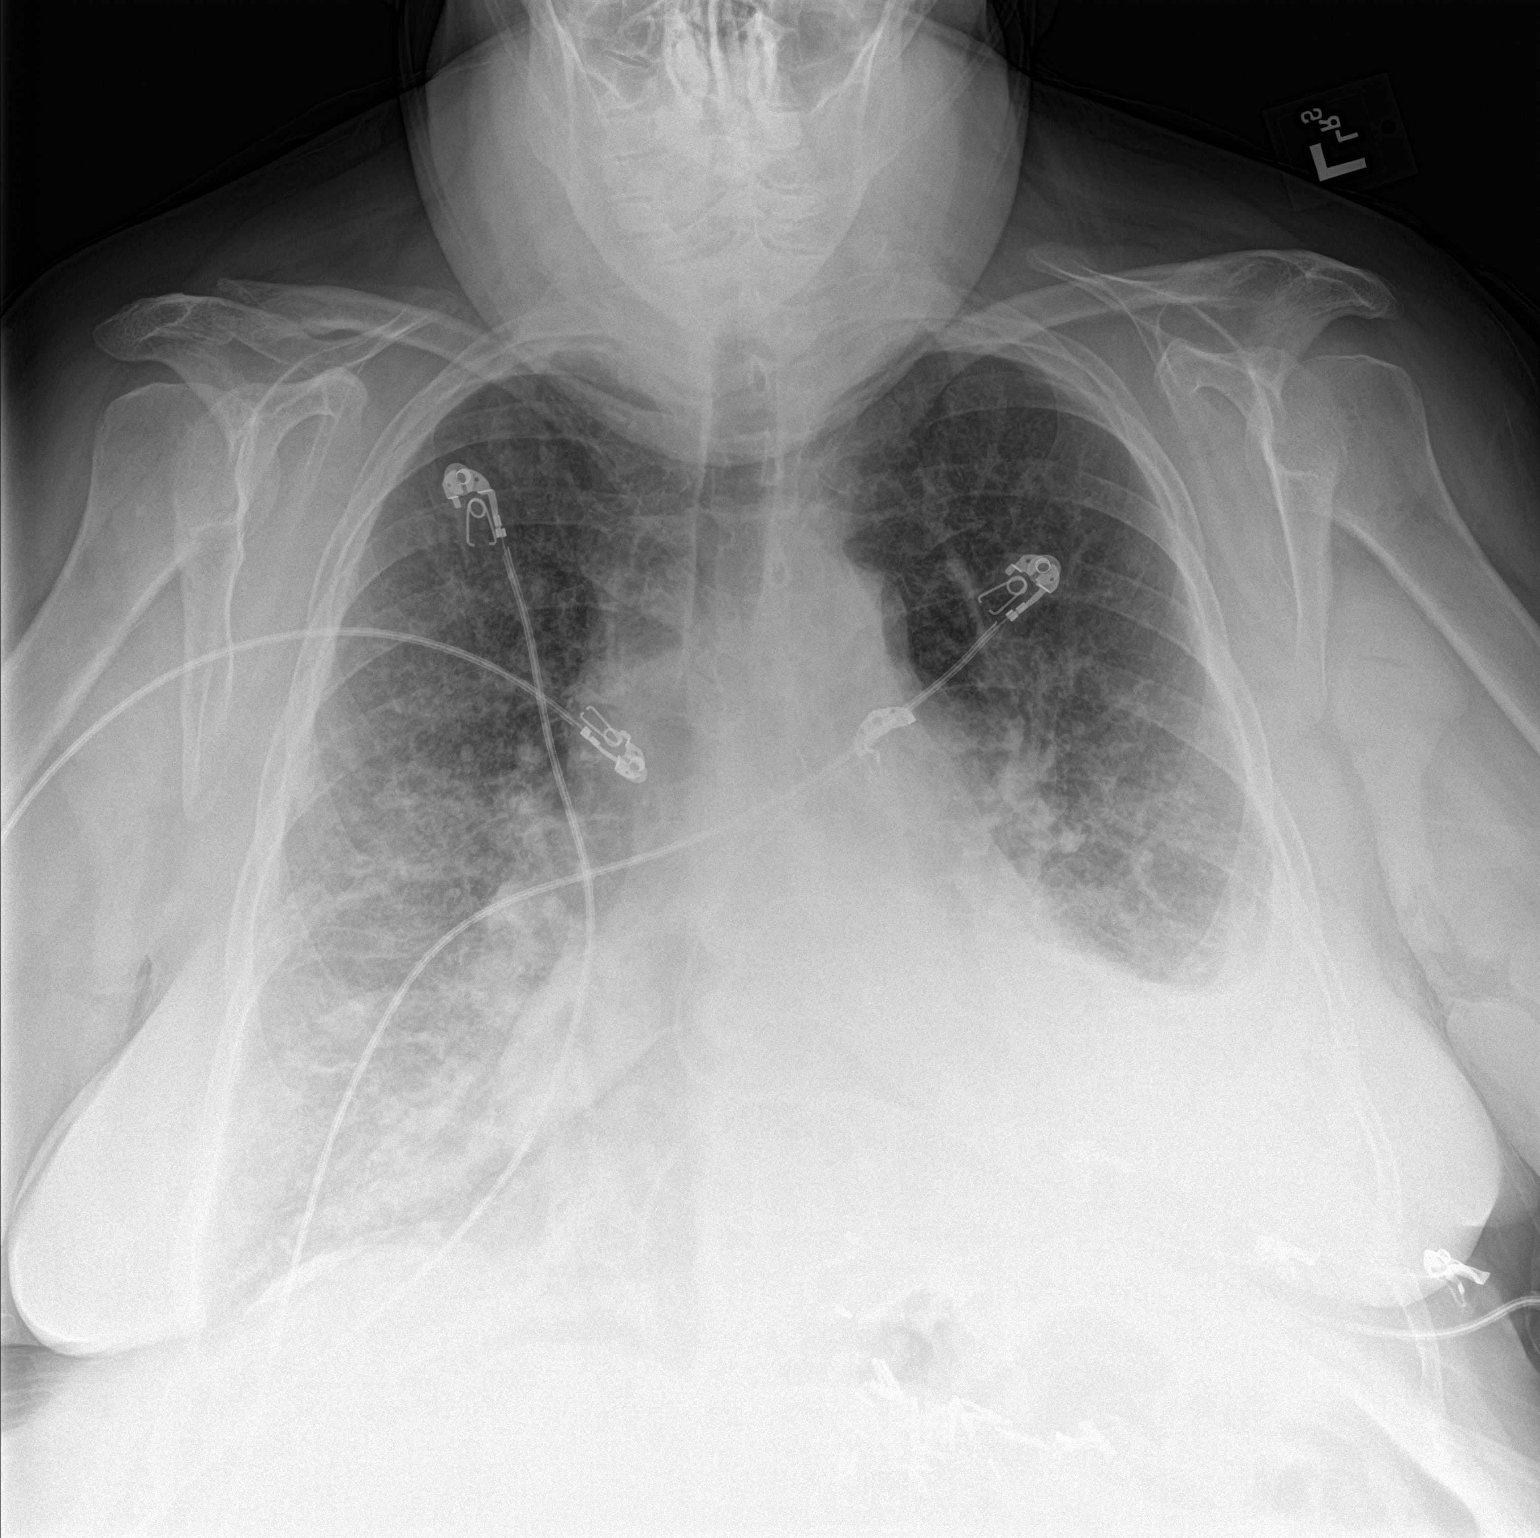

[chest lat]
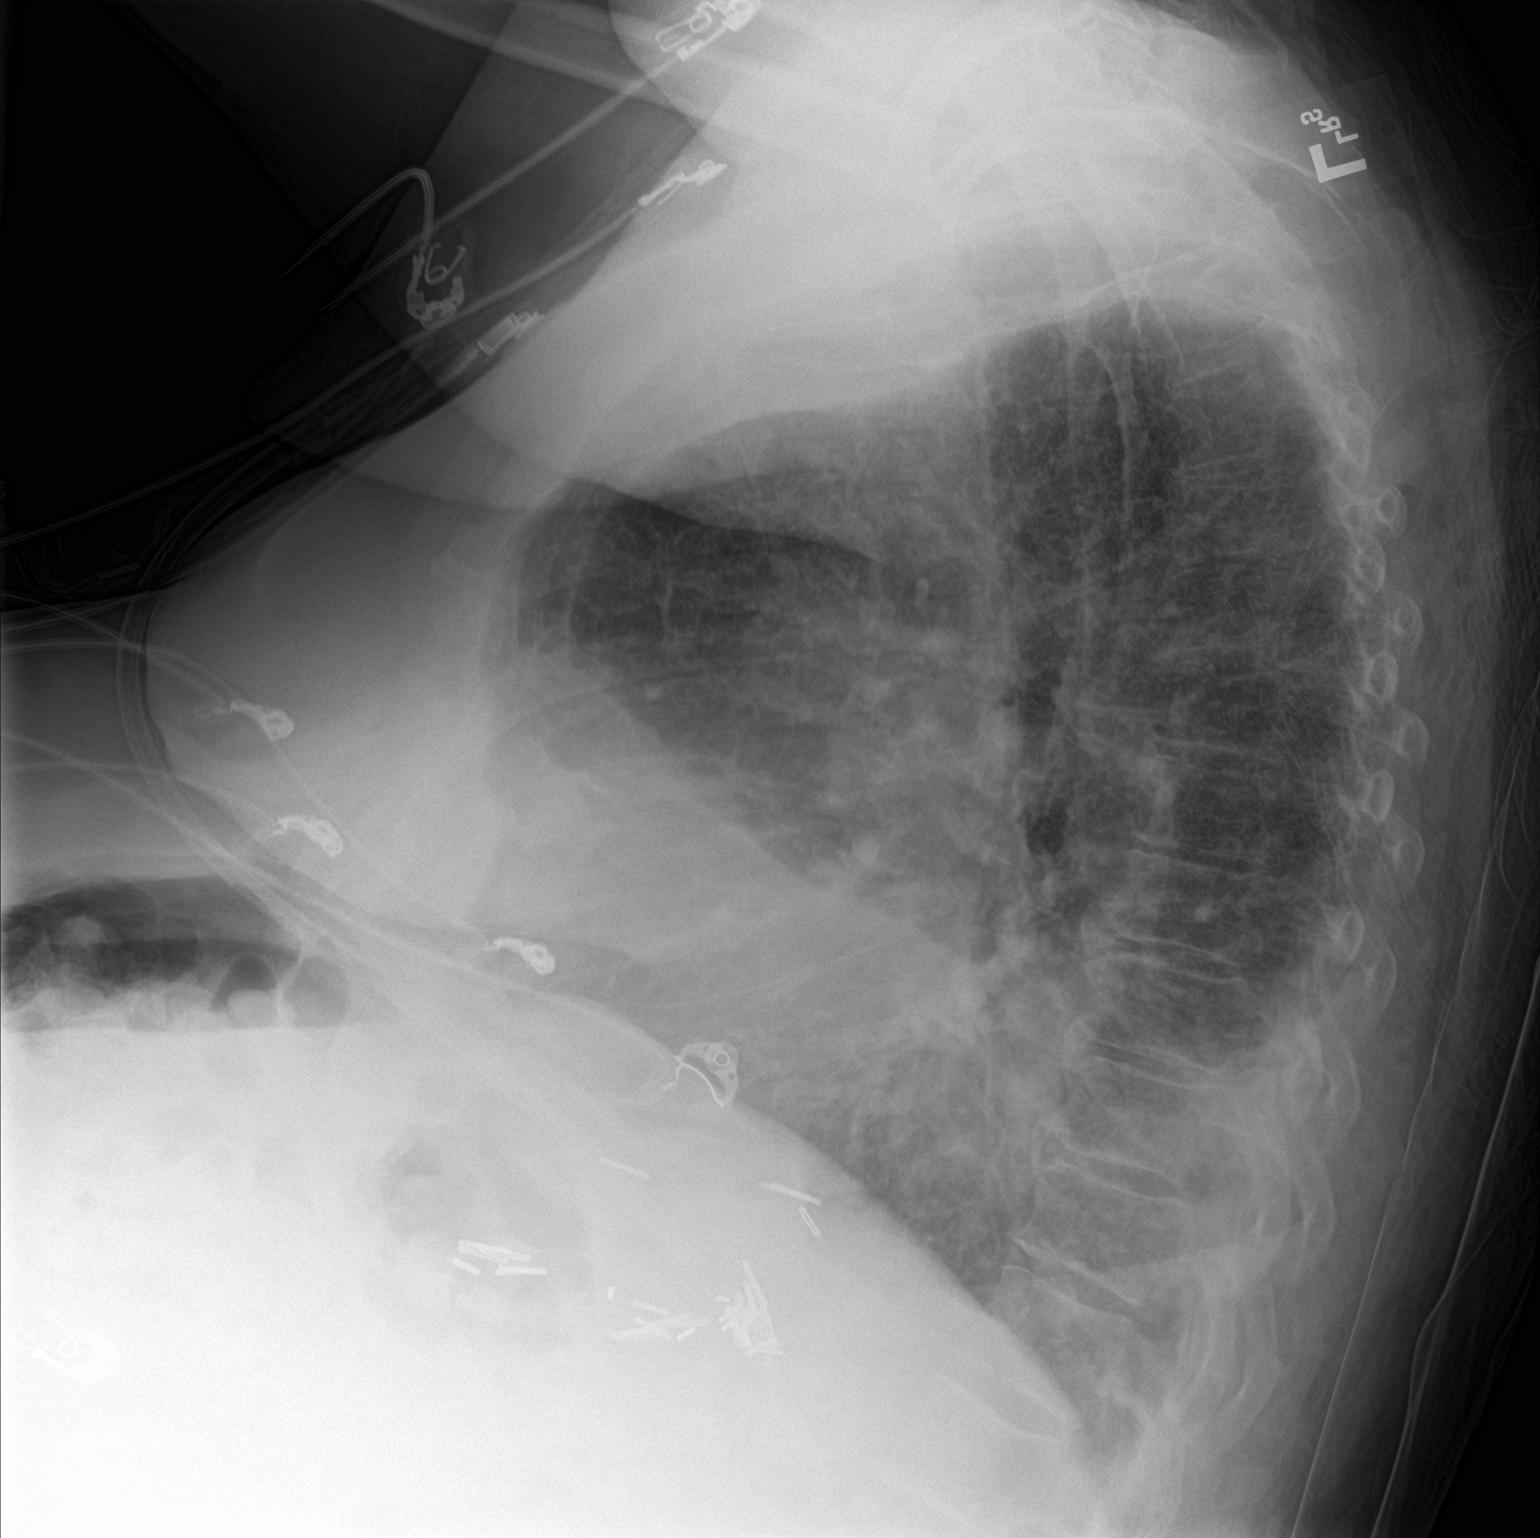

[2 of 2 positions shown; findings below may reference images not displayed]

FINDINGS: Cardiac enlargement with pulmonary vascular congestion. Increasing
perihilar infiltrates since prior study suggest edema. Left pleural
effusion with basilar atelectasis. No pneumothorax. Degenerative
changes in the spine. Surgical clips in the left upper quadrant.
IMPRESSION: Cardiac enlargement with pulmonary vascular congestion and
increasing perihilar infiltrates, likely edema. Left pleural
effusion is increasing. Atelectasis in the left lung base.

## 2018-11-24 IMAGING — CR DG CHEST 2V
2 series · 2 of 2 positions shown · non-contrast
Comparison: 04/15/2006

CLINICAL DATA: Pleural effusion

EXAM:
CHEST  2 VIEW

[chest lat]
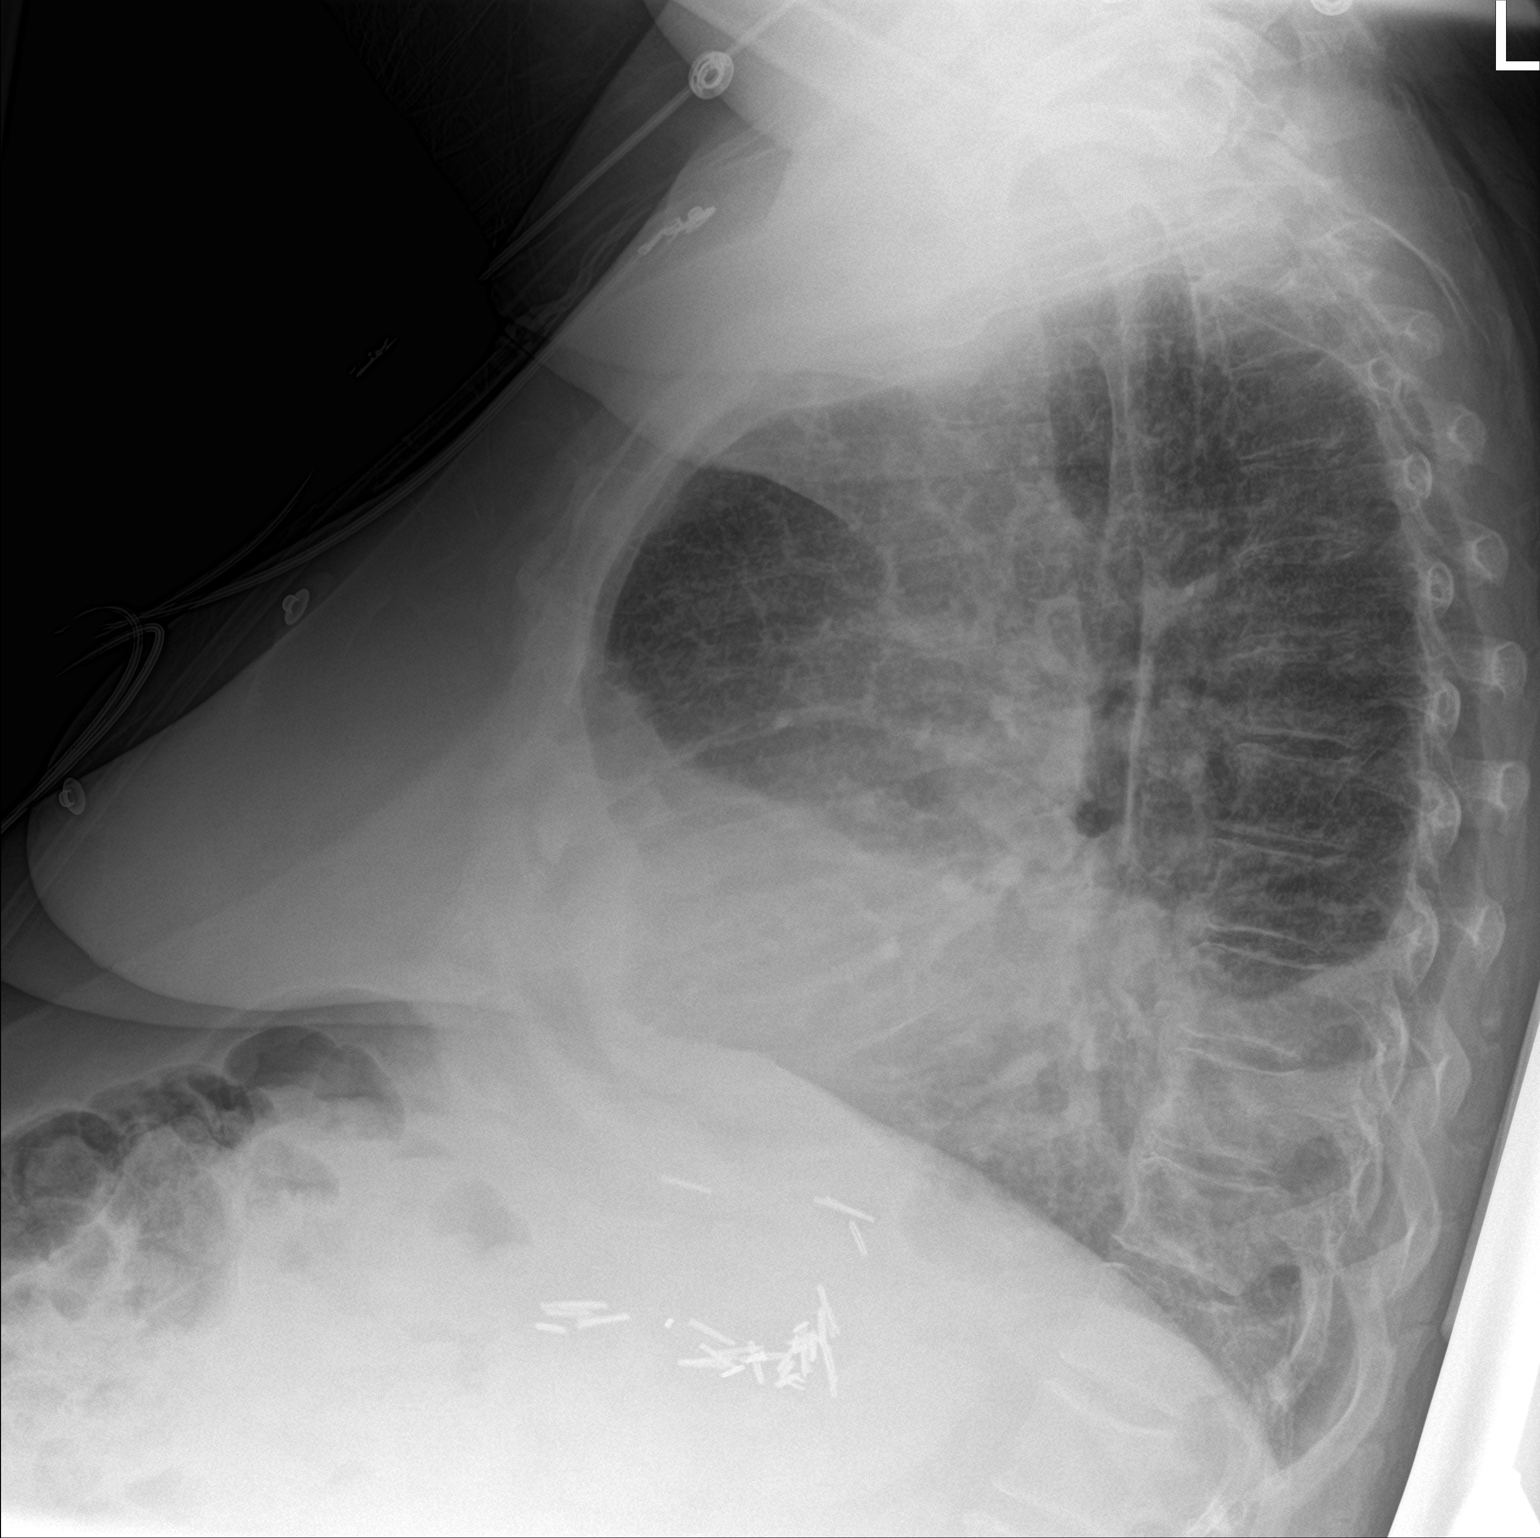

[chest ap]
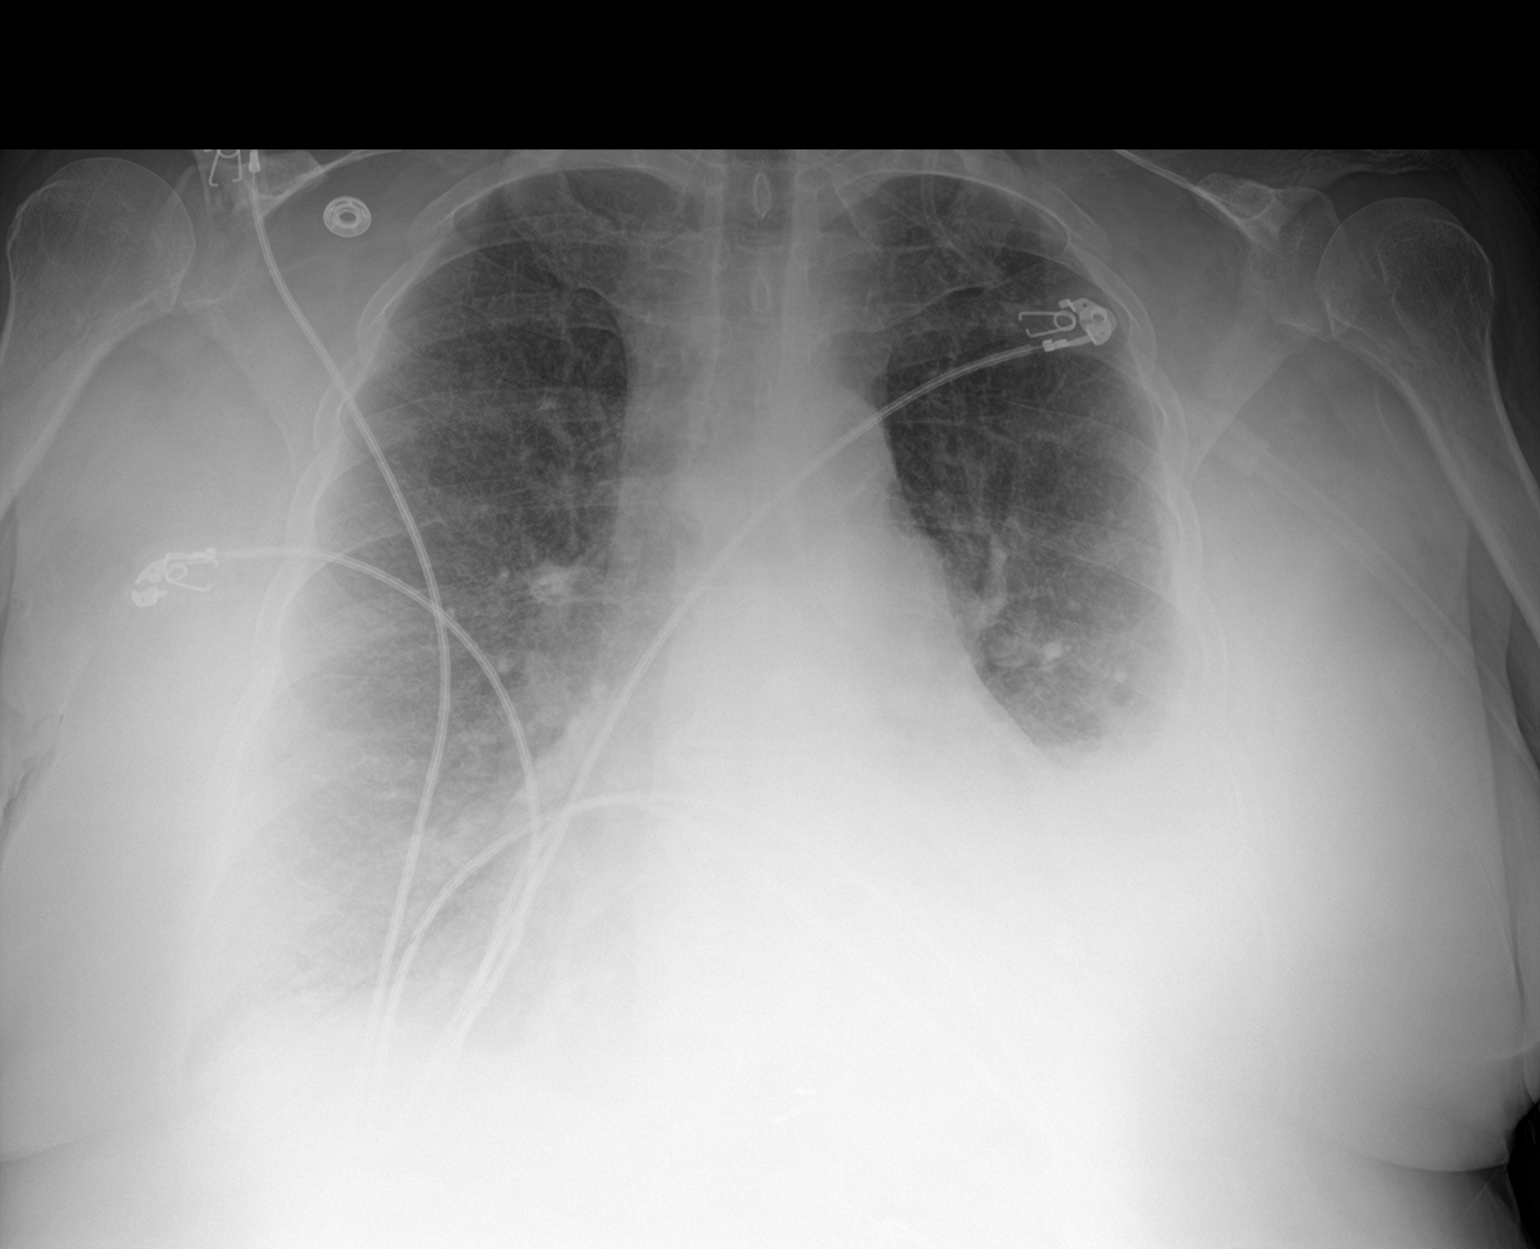

[2 of 2 positions shown; findings below may reference images not displayed]

FINDINGS: Stable enlarged cardiac silhouette. Moderate LEFT effusion is not
changed. Small RIGHT effusion unchanged. Central venous congestion
unchanged. Degenerative osteophytosis of the spine.
IMPRESSION: No change in moderate LEFT effusion.

## 2018-11-24 IMAGING — CR DG CHEST 1V PORT
1 series · 1 of 1 positions shown · non-contrast
Comparison: 04/19/2016.  02/06/2016.

CLINICAL DATA: Thoracentesis.

EXAM:
PORTABLE CHEST 1 VIEW

[portable]
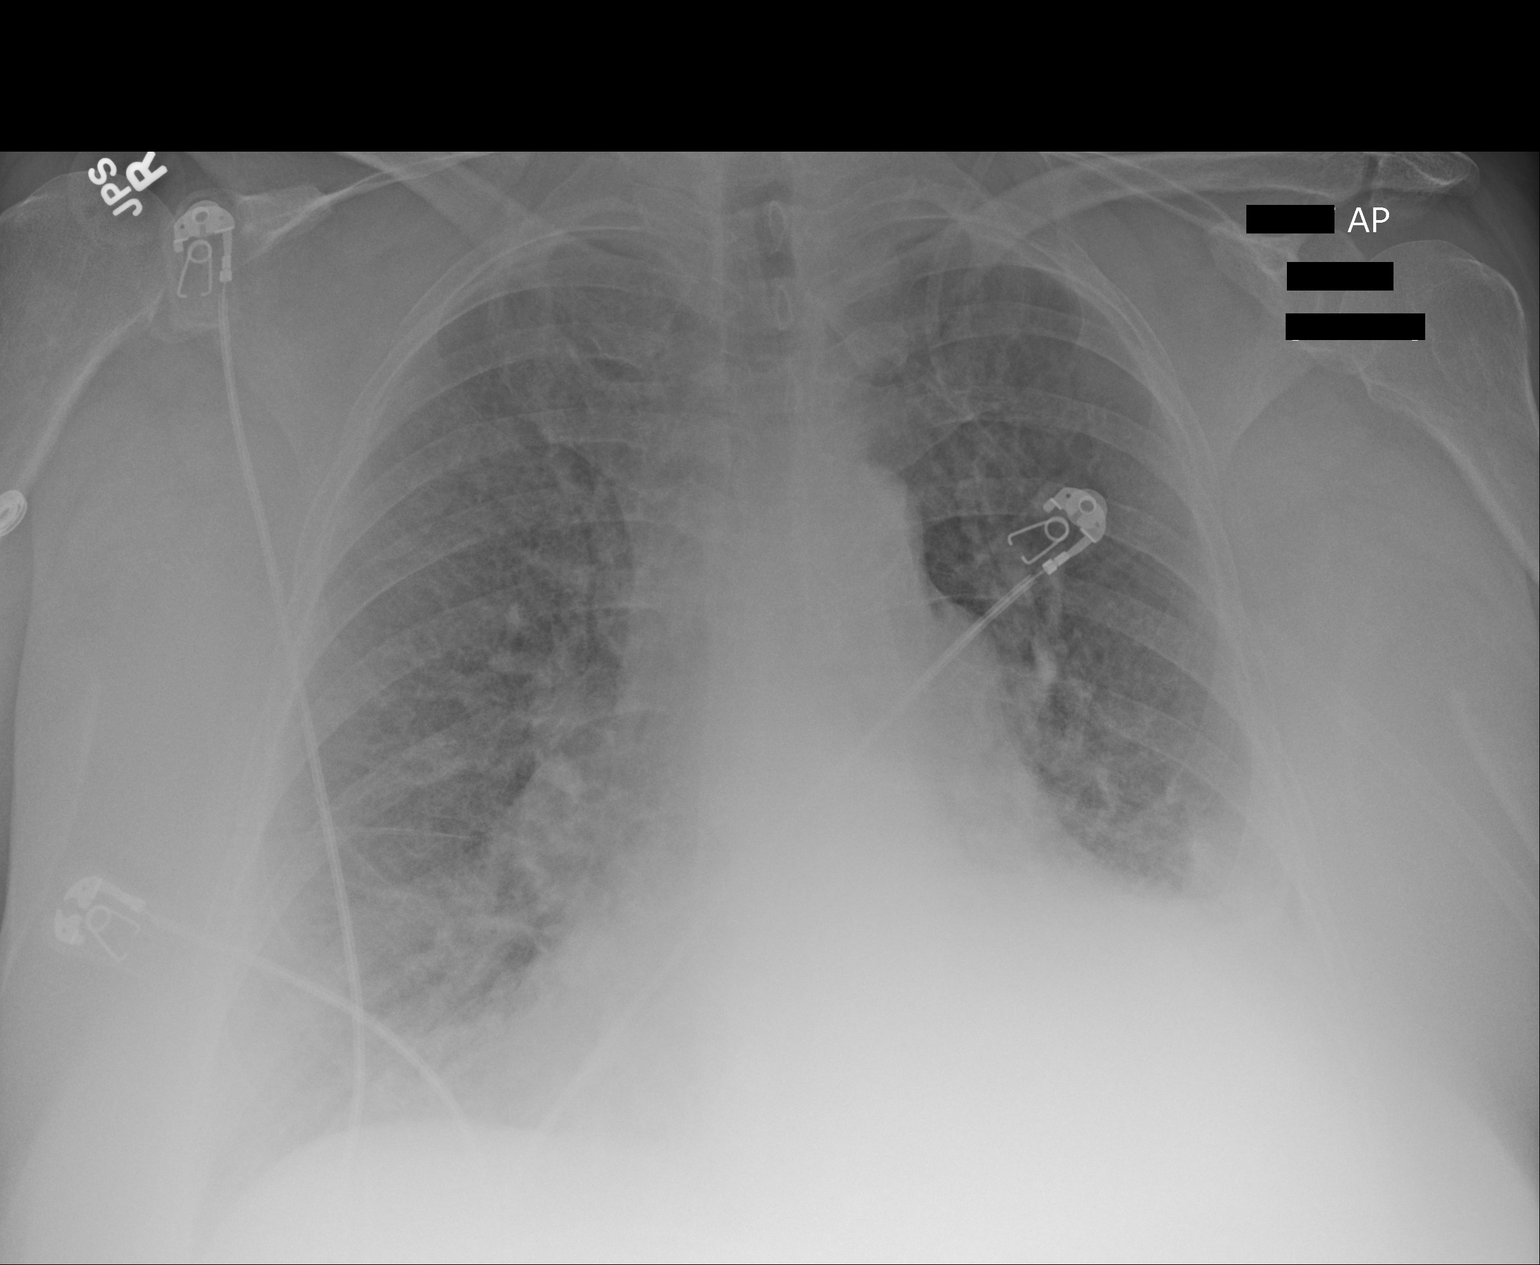

[1 of 1 positions shown; findings below may reference images not displayed]

FINDINGS: Cardiomegaly with bilateral diffuse pulmonary interstitial
prominence and bilateral pleural effusions, left side greater right.
Findings consistent with congestive heart failure. Findings have
progressed slightly from prior exam.
IMPRESSION: Congestive heart failure with bilateral pulmonary edema with
bilateral pleural effusions, left side greater right. Findings have
progressed slightly from prior exam.

## 2018-11-28 IMAGING — US US RENAL
1 series · 14 of 25 positions shown · non-contrast
Comparison: PET/CT performed 11/12/2014

CLINICAL DATA: Chronic renal insufficiency. Acute onset of
shortness of breath. Initial encounter.

EXAM:
RENAL / URINARY TRACT ULTRASOUND COMPLETE

[Series 1: us renal · 0.30mm/px · 14 of 30 slices shown]
[im 1/30]
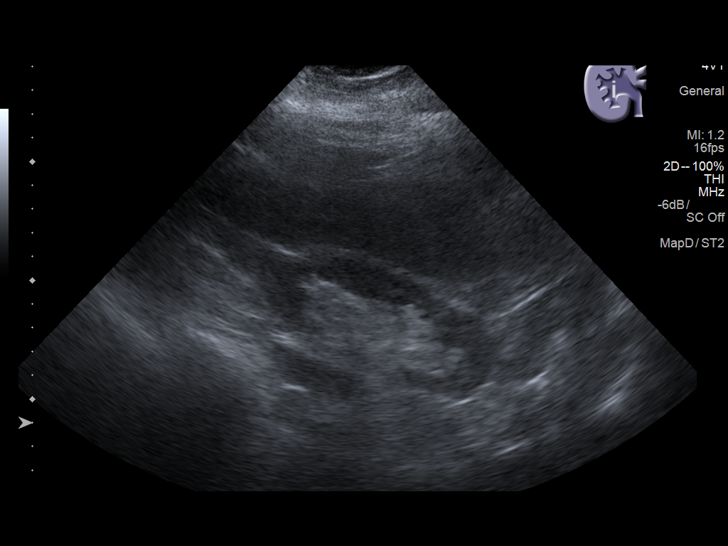
[im 3/30]
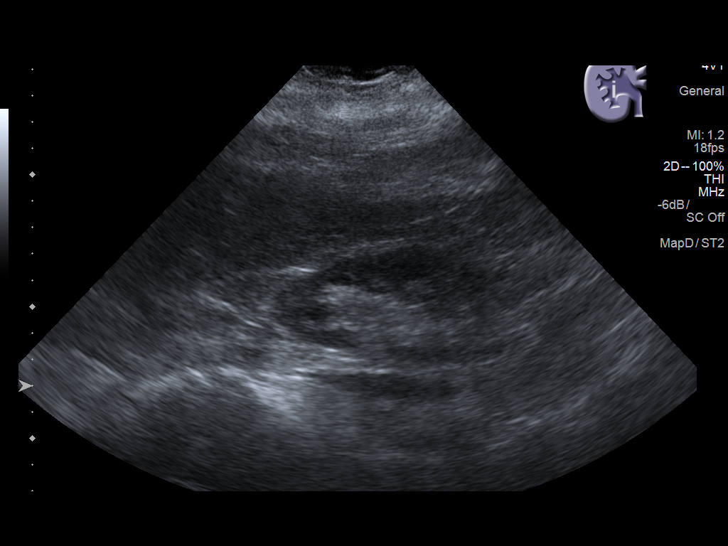
[im 5/30]
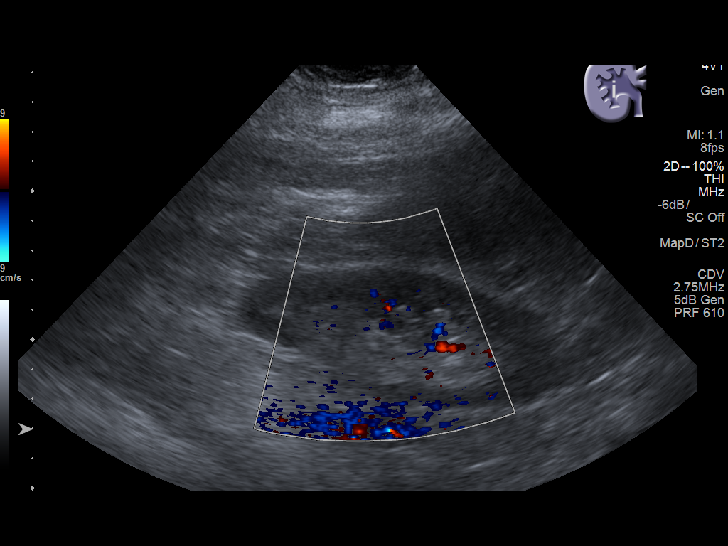
[im 8/30]
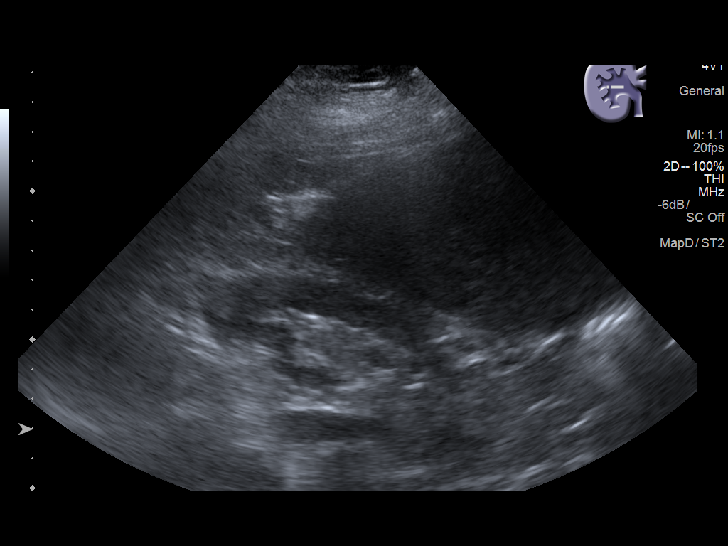
[im 10/30]
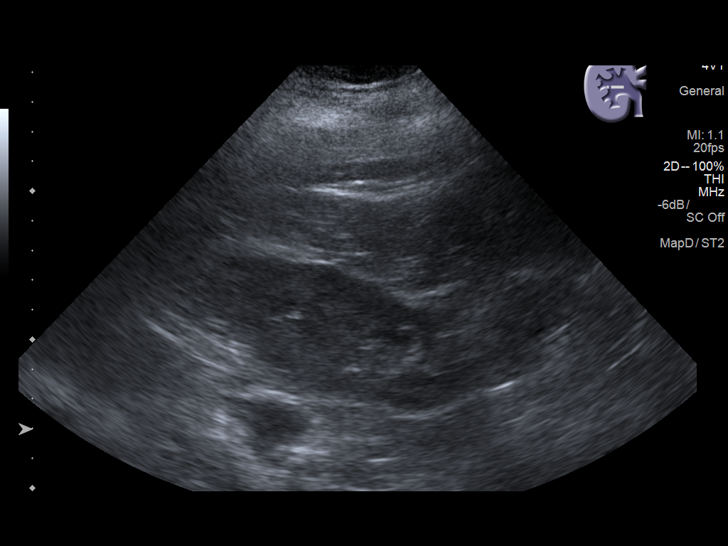
[im 11/30]
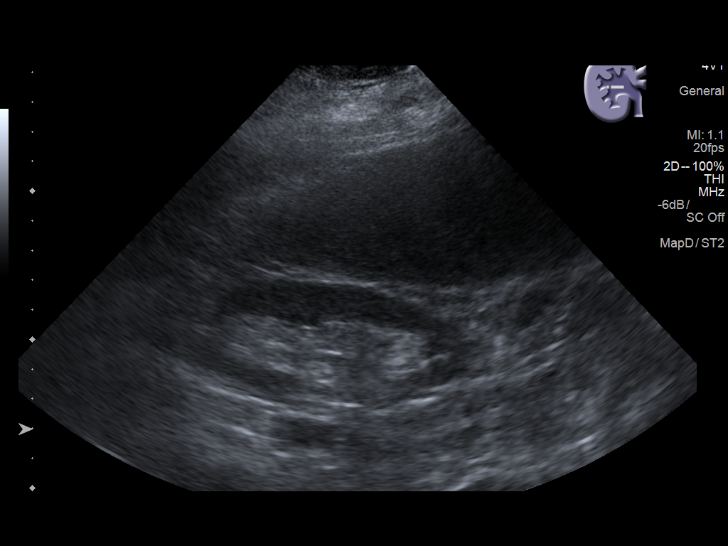
[im 14/30]
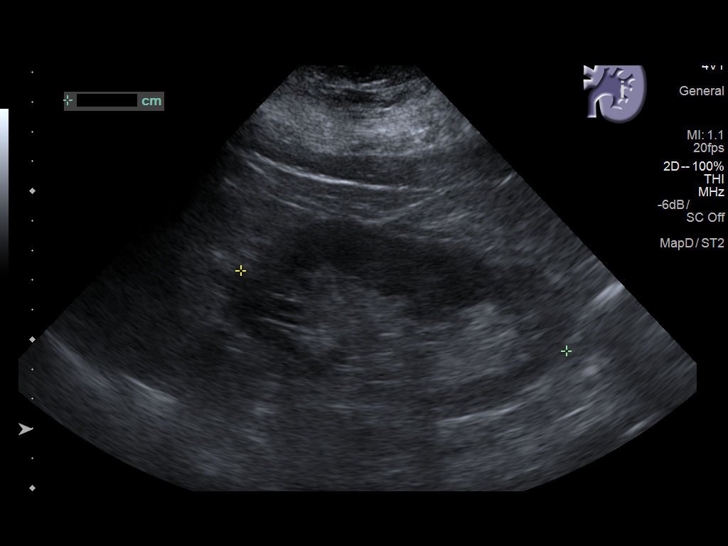
[im 16/30]
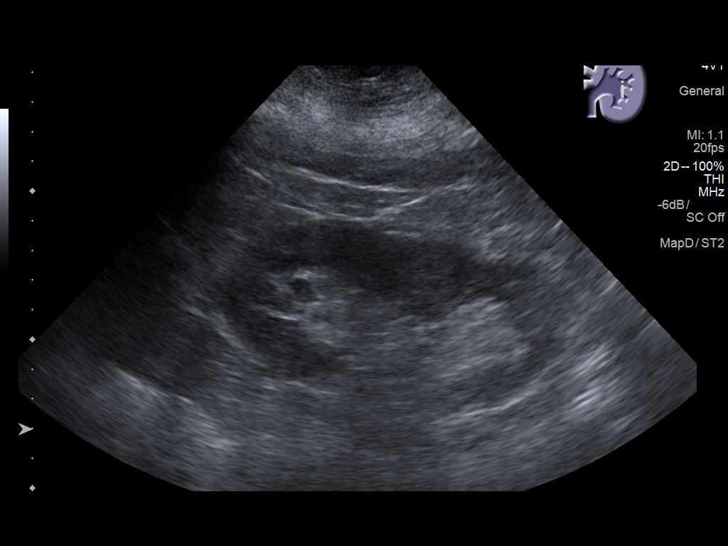
[im 19/30]
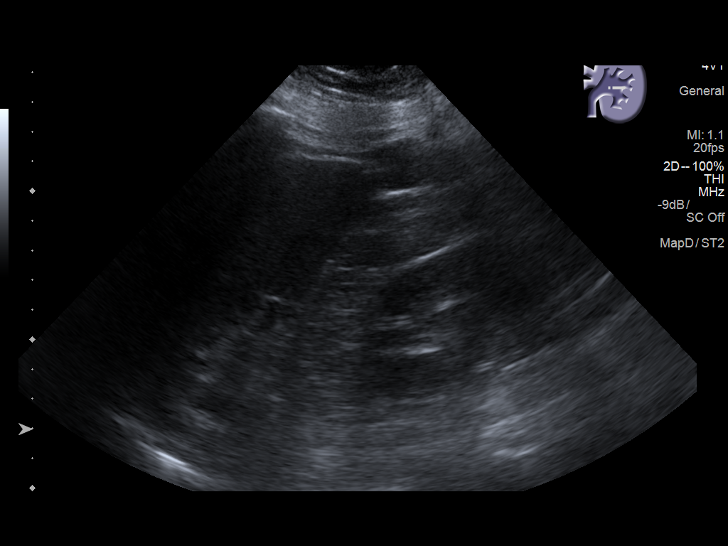
[im 20/30]
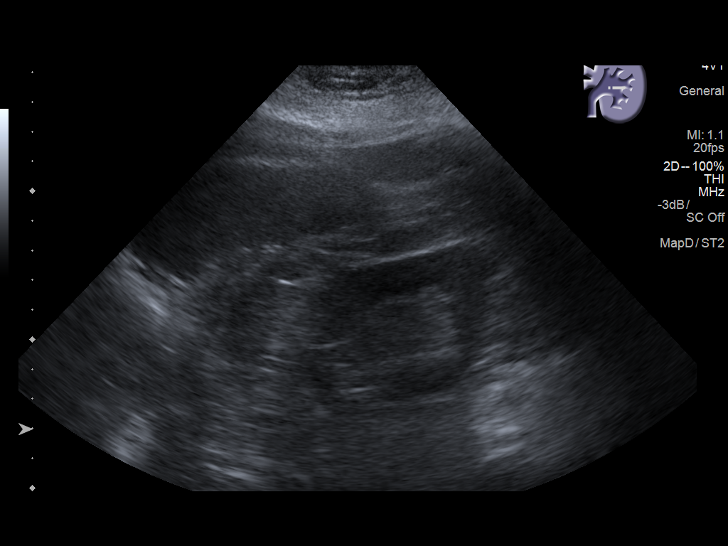
[im 22/30]
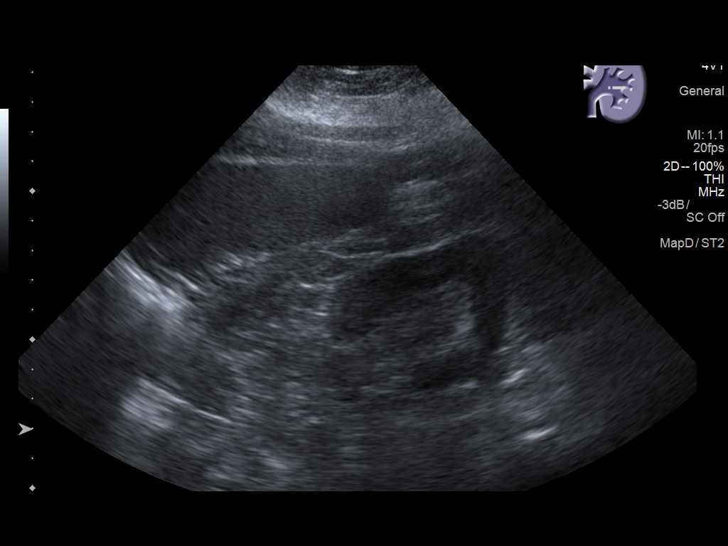
[im 25/30]
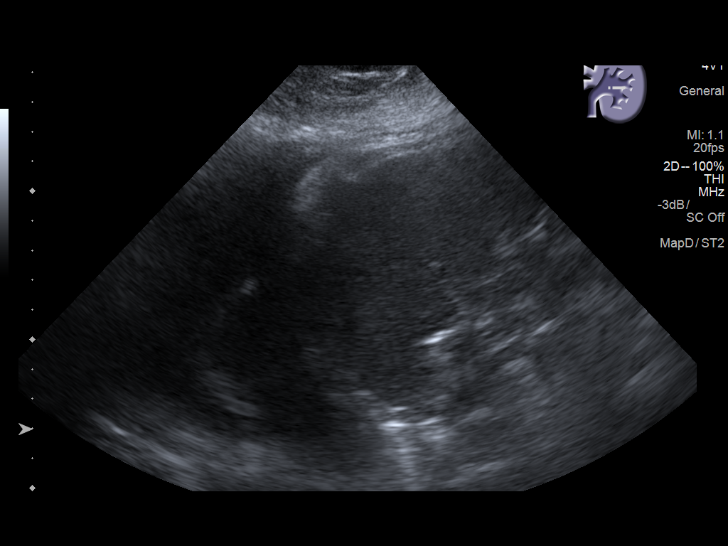
[im 27/30]
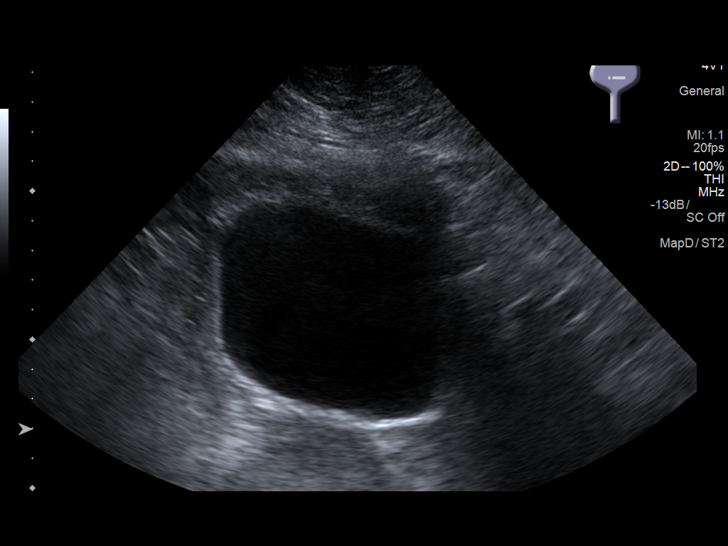
[im 30/30]
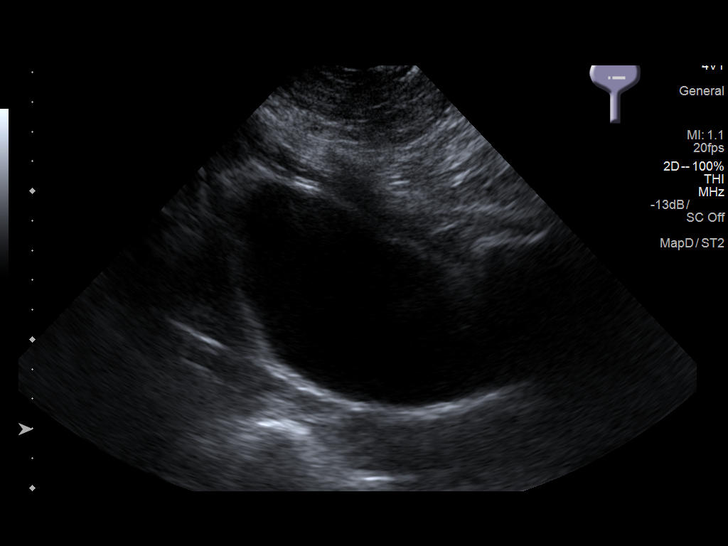

[14 of 25 positions shown; findings below may reference images not displayed]

FINDINGS: Right Kidney:

Length: 11.3 cm. Echogenicity within normal limits. No mass or
hydronephrosis visualized.

Left Kidney:

Length: 11.3 cm. Echogenicity within normal limits. No mass or
hydronephrosis visualized.

Bladder:

Appears normal for degree of bladder distention.

A large left-sided pleural effusion is noted.
IMPRESSION: 1. No evidence of hydronephrosis. Kidneys grossly unremarkable in
appearance.
2. Large left-sided pleural effusion noted.

## 2019-02-21 IMAGING — CR DG CHEST 2V
2 series · 2 of 2 positions shown · non-contrast
Comparison: 06/01/2016

CLINICAL DATA: Worsening shortness of breath for 2 days. History of
lung cancer, congestive heart failure, pneumonia, coronary artery
disease, hypertension, chronic bronchitis, emphysema, former smoker.

EXAM:
CHEST  2 VIEW

[w chest pa]
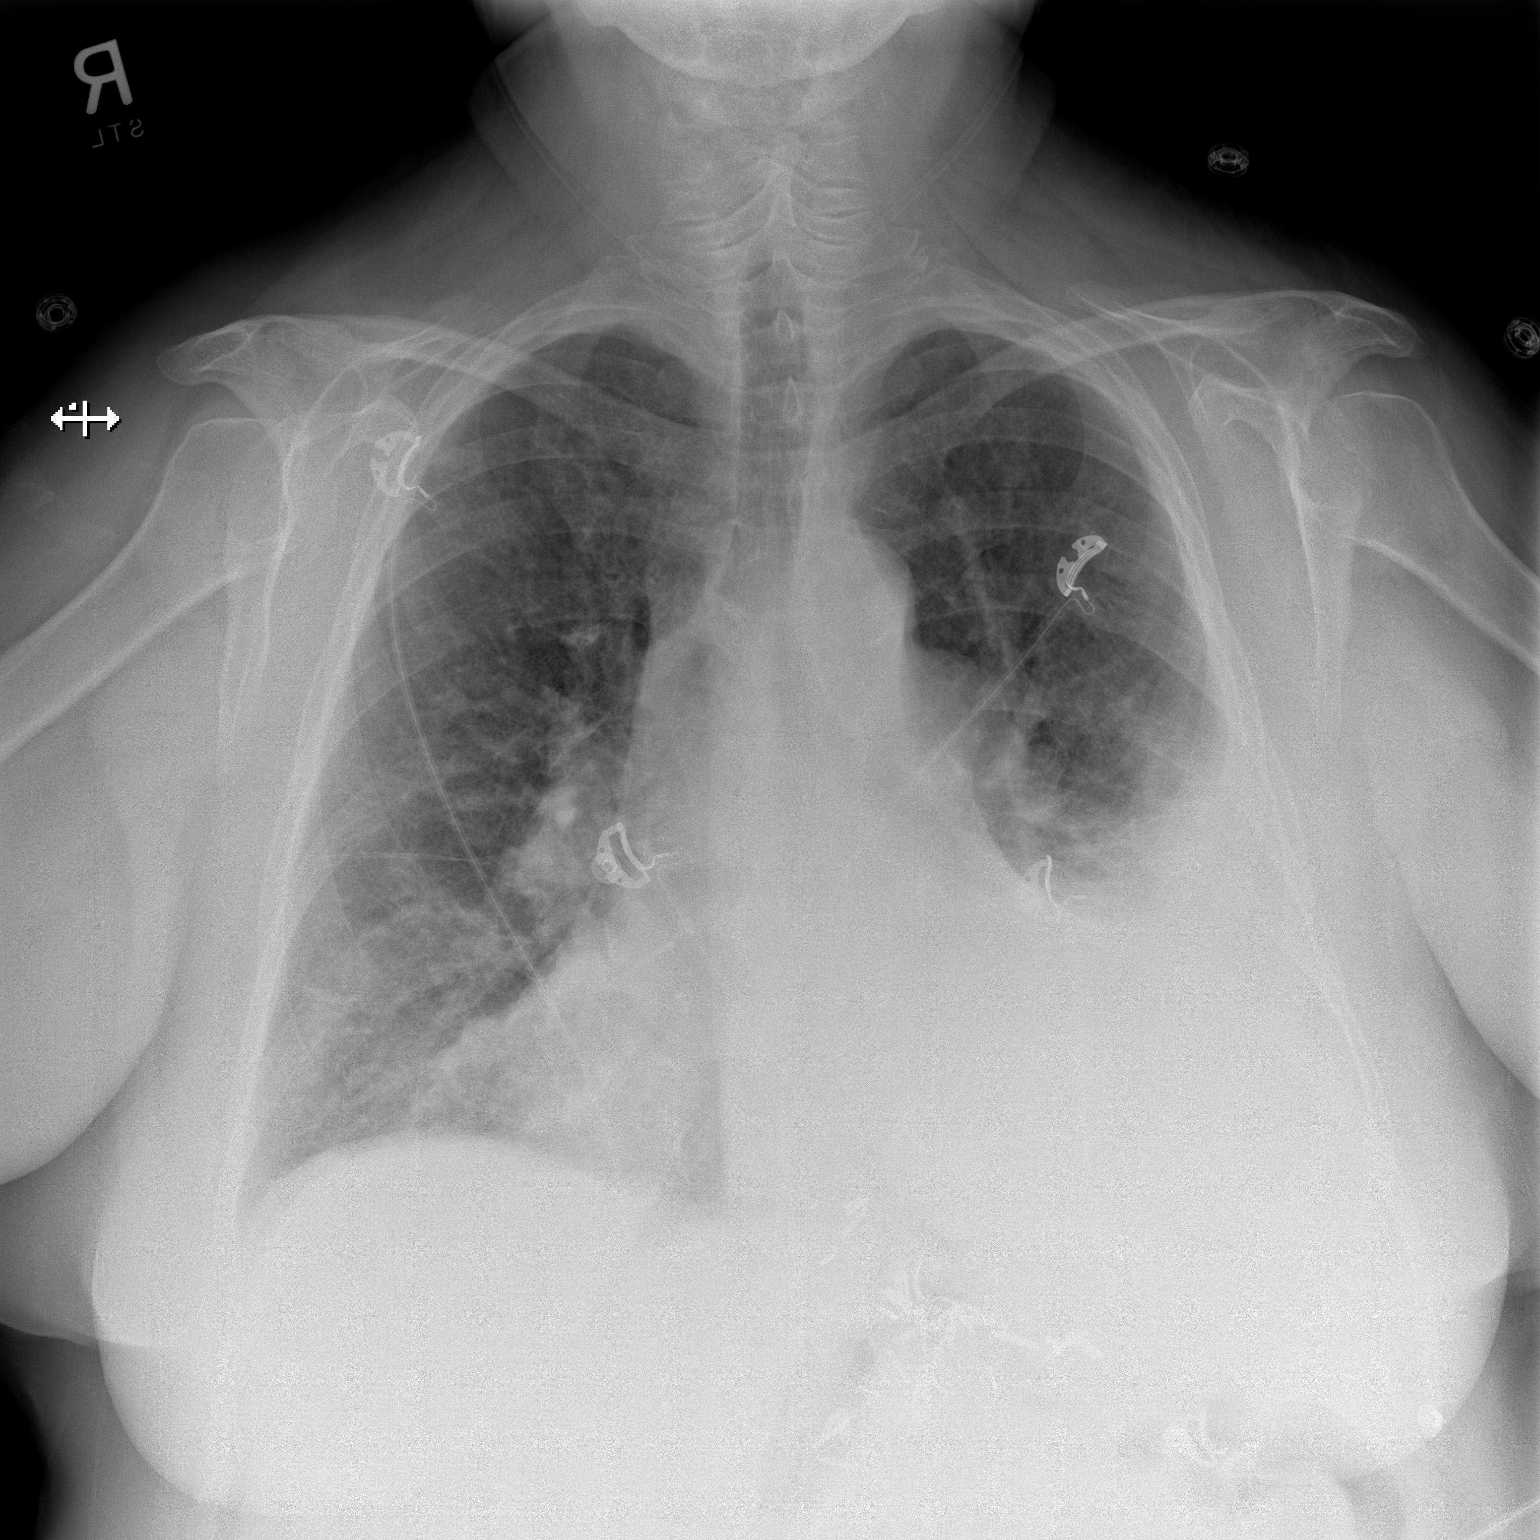

[w chest lat]
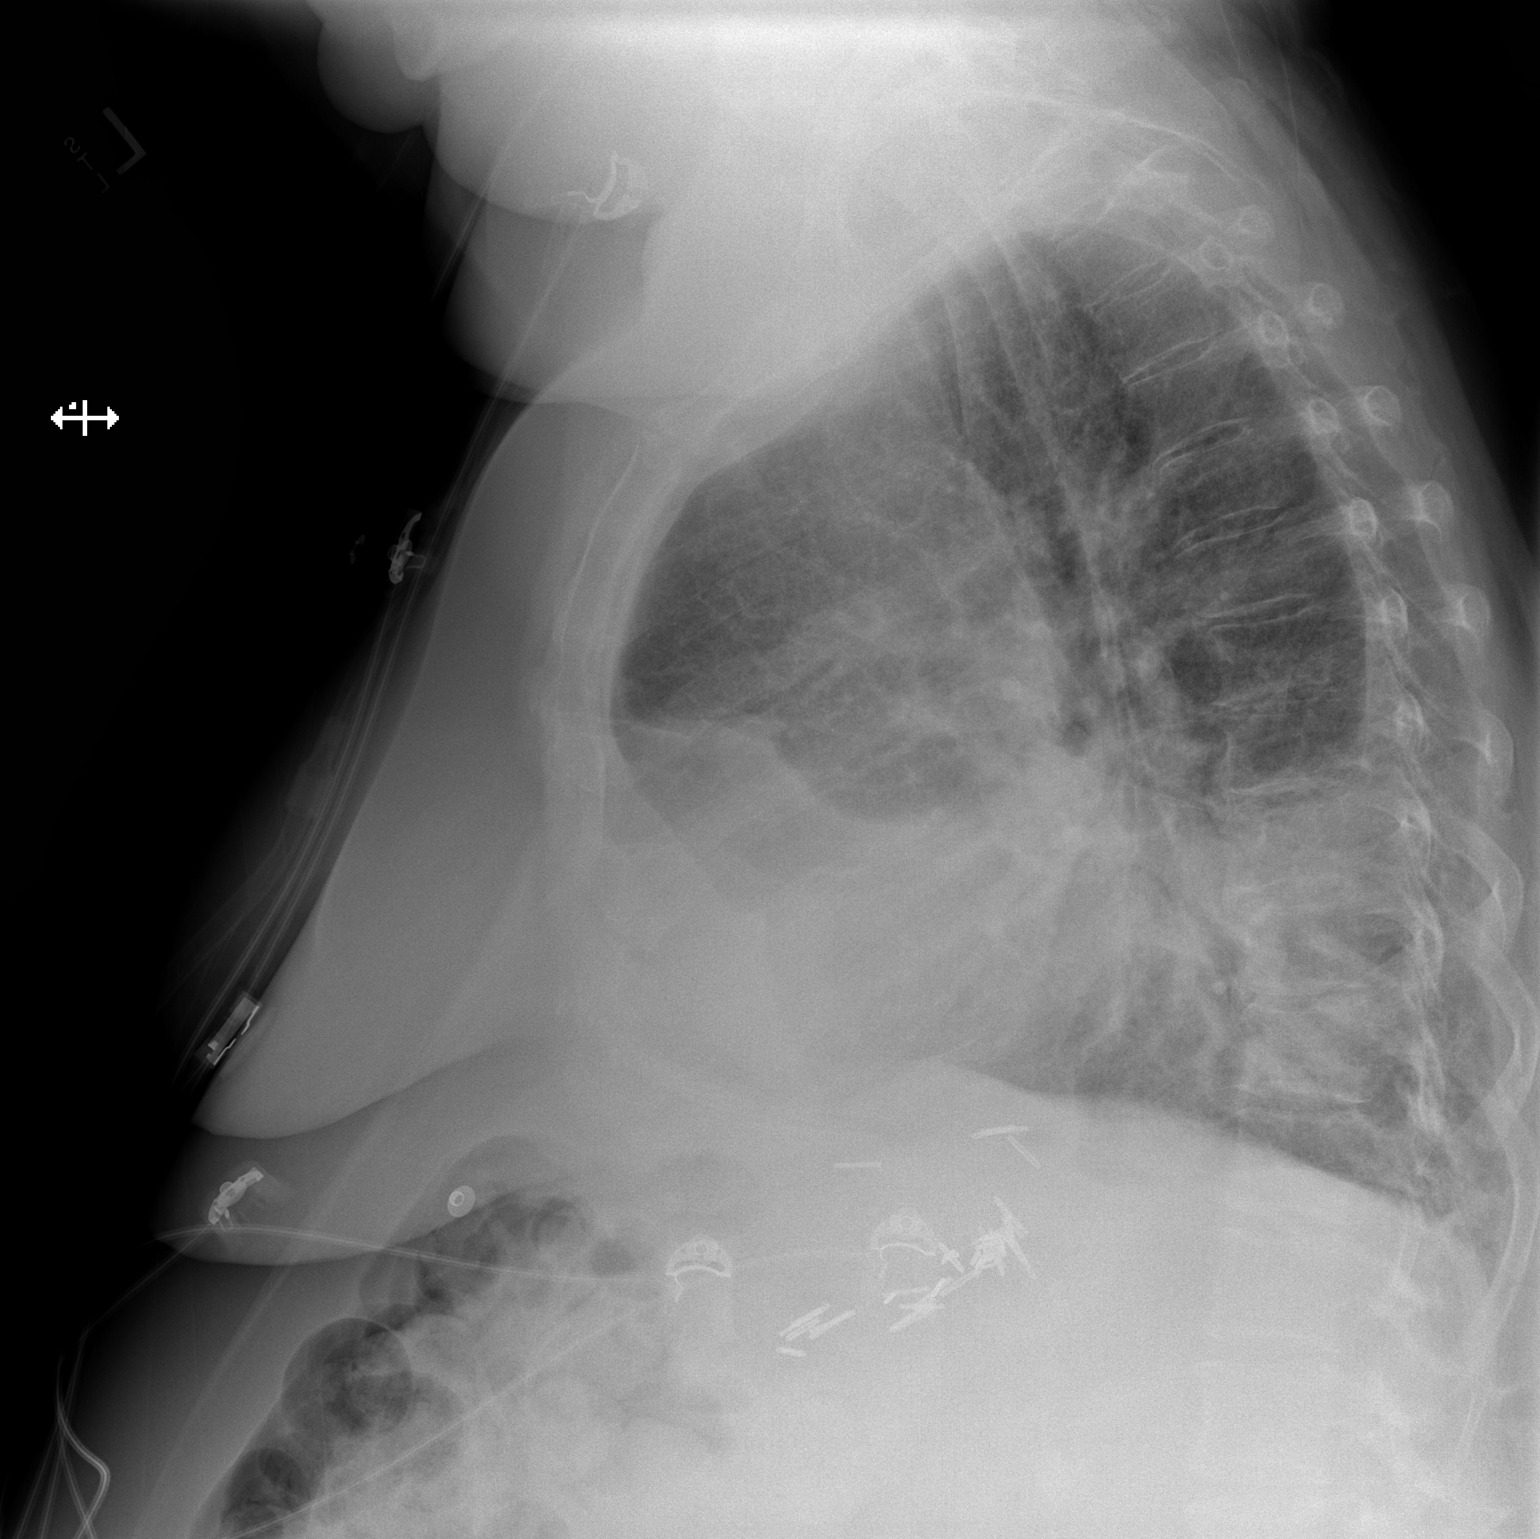

[2 of 2 positions shown; findings below may reference images not displayed]

FINDINGS: Moderate left pleural effusion with collapse and consolidation in
the left lung base. Appearances similar to previous study.
Atelectasis or infiltration in the right lung base is increasing. No
pneumothorax. Calcification of the aorta. Heart size is obscured by
the parenchymal process but appears mildly enlarged. Surgical clips
in the left upper quadrant.
IMPRESSION: Left pleural effusion with basilar atelectasis and consolidation is
unchanged. Increasing atelectasis or infiltration in the right lung
base.

## 2019-02-22 IMAGING — DX DG CHEST 1V
1 series · 1 of 1 positions shown · non-contrast
Comparison: Chest x-ray dated 07/17/2016.

CLINICAL DATA: Status post left thoracentesis.

EXAM:
CHEST 1 VIEW

[x chest ap]
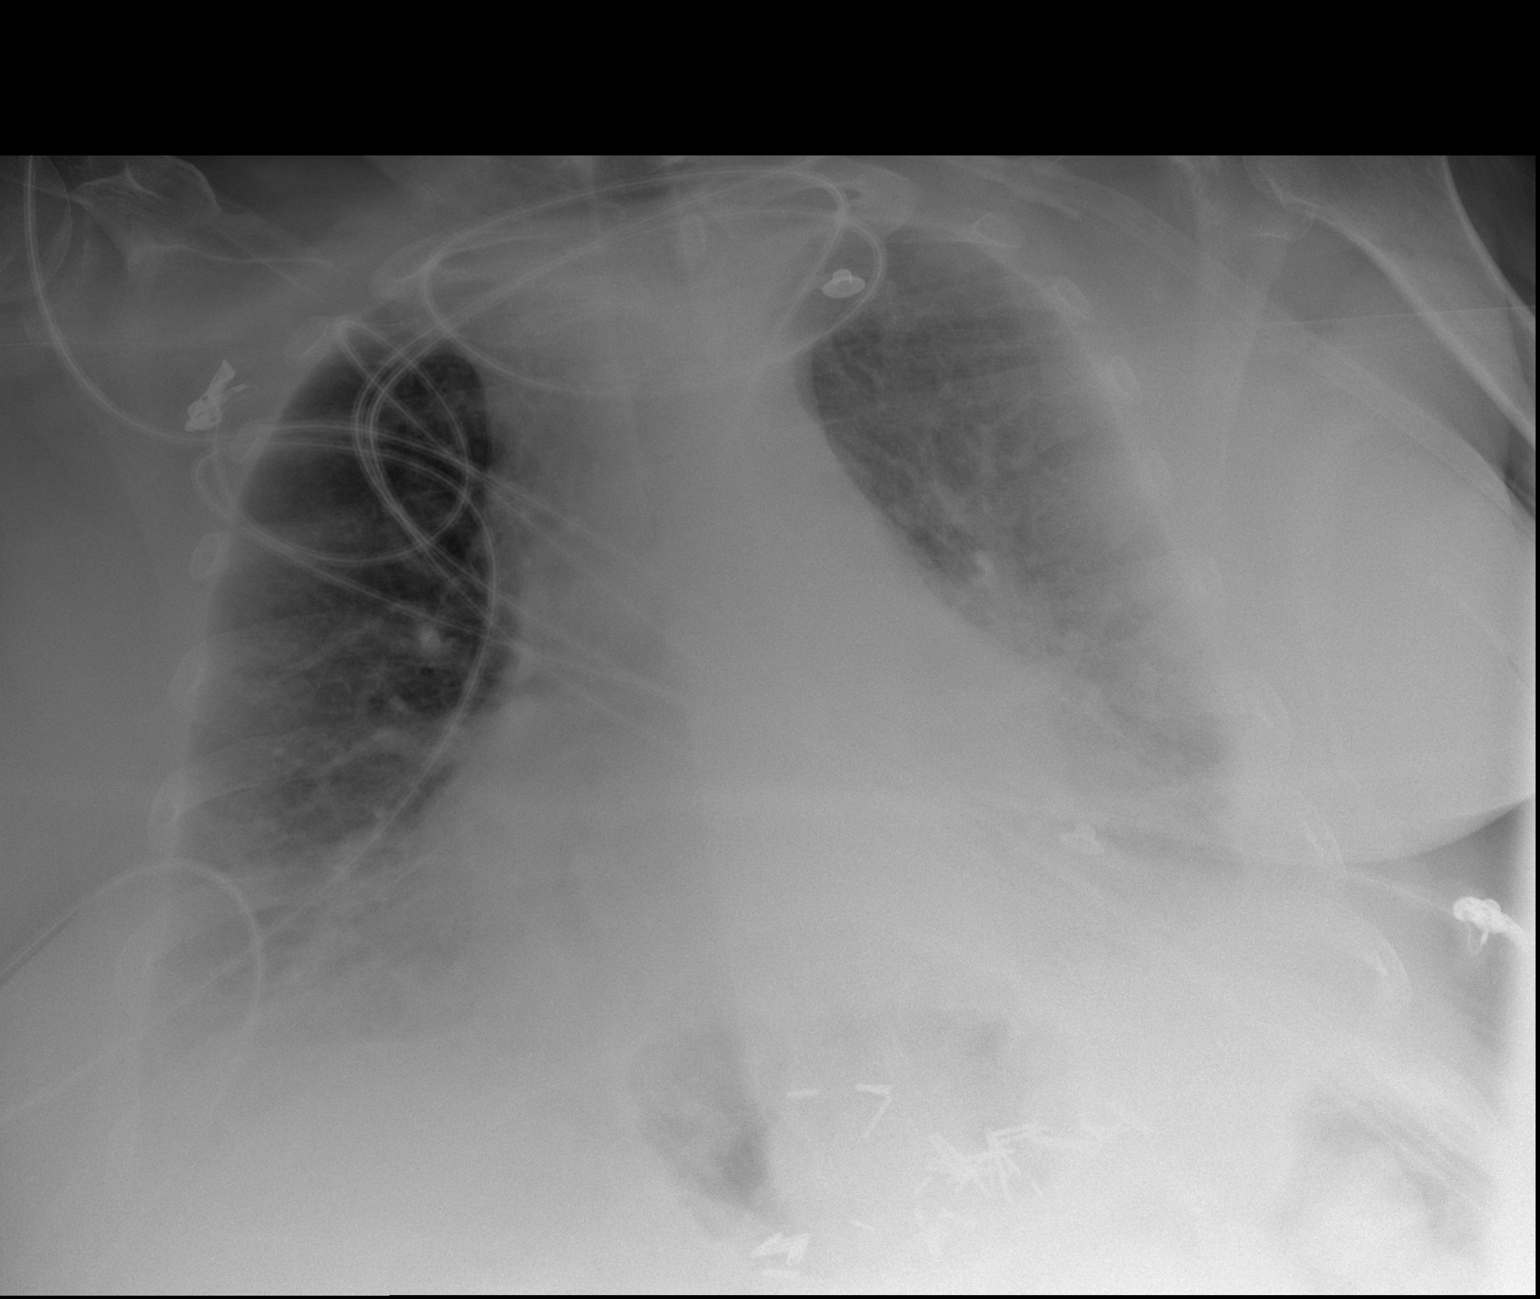

[1 of 1 positions shown; findings below may reference images not displayed]

FINDINGS: Decreased left-sided pleural effusion status post thoracentesis. No
pneumothorax seen. Right lung remains relatively clear. Heart size
and mediastinal contours appear stable.
IMPRESSION: Decreased size of the left pleural effusion status post
thoracentesis. No pneumothorax or other procedural complication
seen.

## 2019-02-22 IMAGING — CT CT CHEST W/O CM
2 of 3 series · 9 of 36 positions shown, 11 images · non-contrast
Comparison: 07/17/2016 and 06/01/2016 chest x-ray. 11/25/2015 and
11/25/2015 chest CT.

CLINICAL DATA: 61-year-old female with chest heaviness and
shortness breath for 2 days. COPD, diabetes, pulmonary hypertension
and history of lung cancer post radiation therapy. Subsequent
encounter.

EXAM:
CT CHEST WITHOUT CONTRAST
TECHNIQUE: Multidetector CT imaging of the chest was performed following the
standard protocol without IV contrast.

[Series 201: chest without, idose (2) · axial · non-contrast · 0.94mm/px · z∈[+56,+348]mm · 6 of 151 slices shown, 8 images]
[im 17/151  mediastinal]
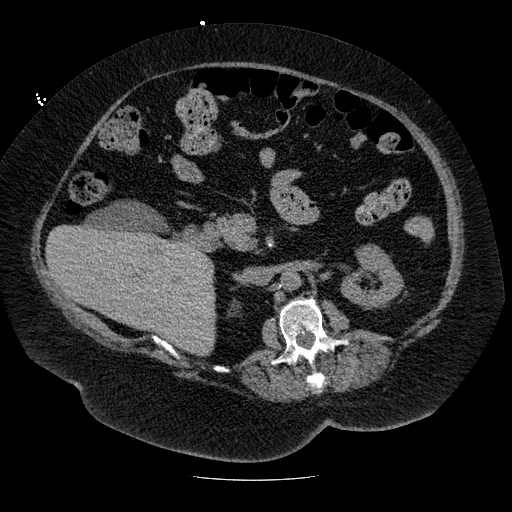
[im 17/151  lung]
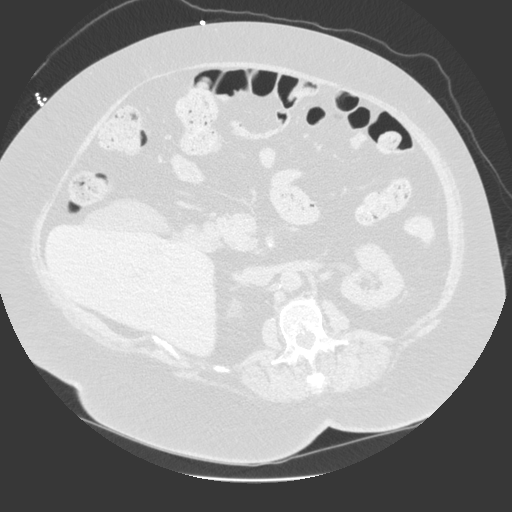
[im 39/151  lung]
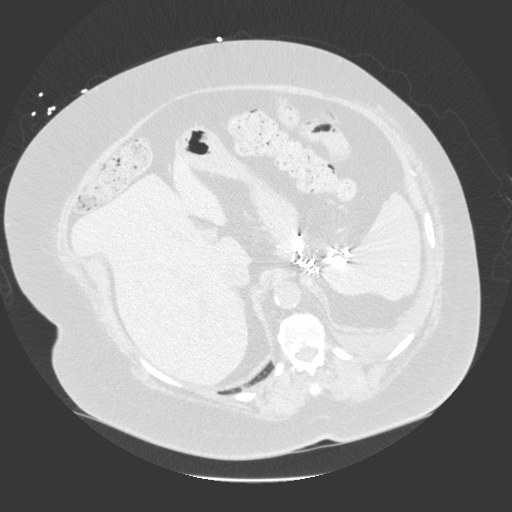
[im 62/151  lung]
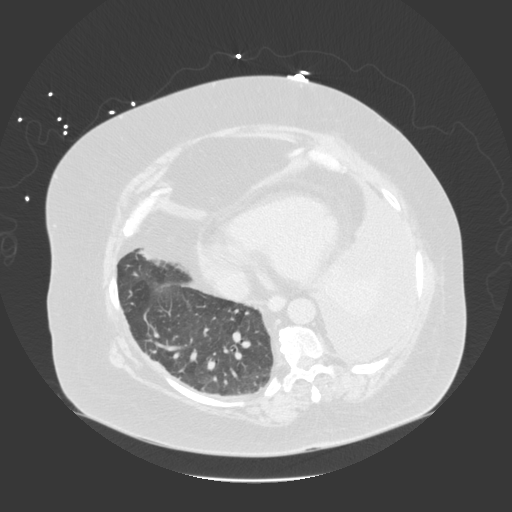
[im 89/151  lung]
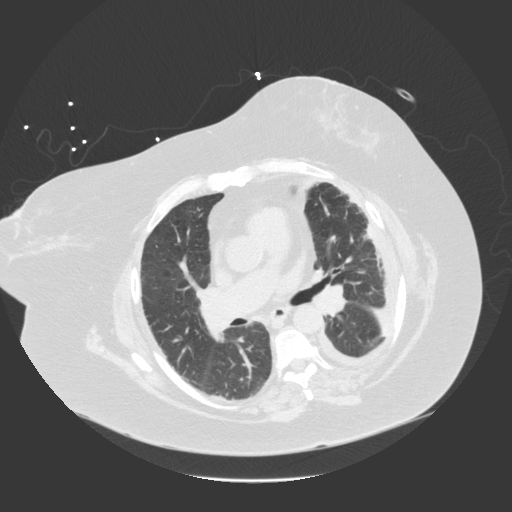
[im 112/151  mediastinal]
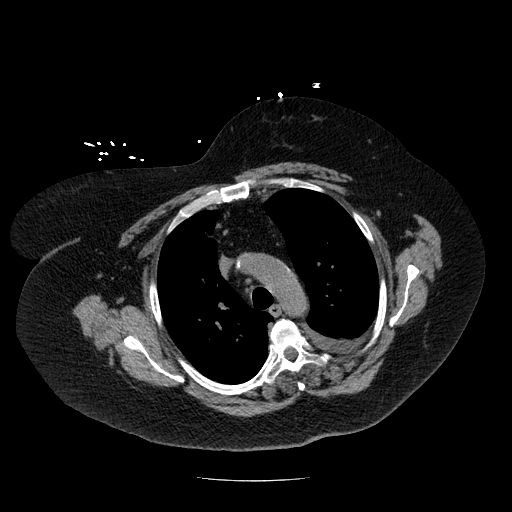
[im 112/151  lung]
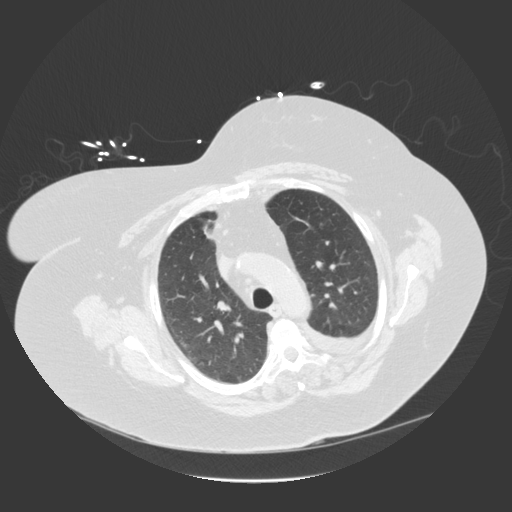
[im 134/151  lung]
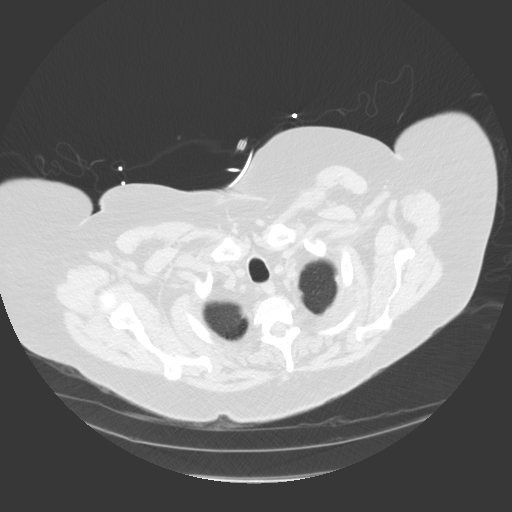

[Series 203: coronal, idose (2) · coronal · 0.45mm/px · 3 of 181 slices shown]
[im 37/181  lung]
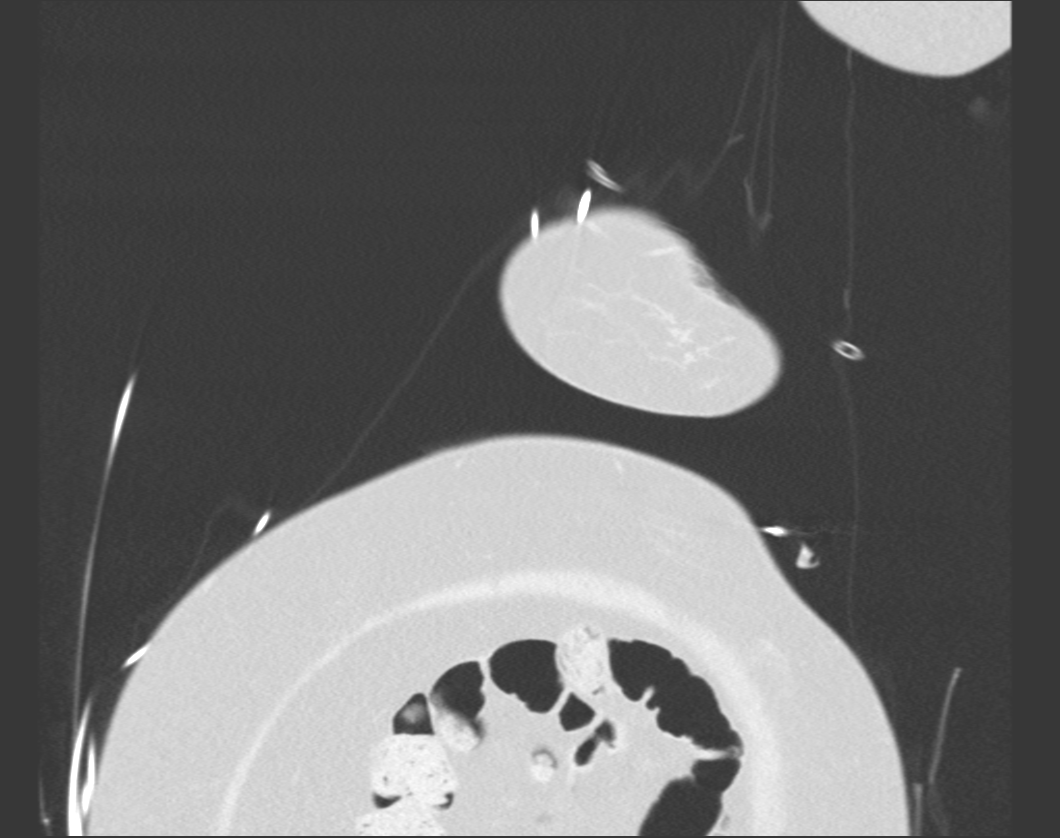
[im 73/181  lung]
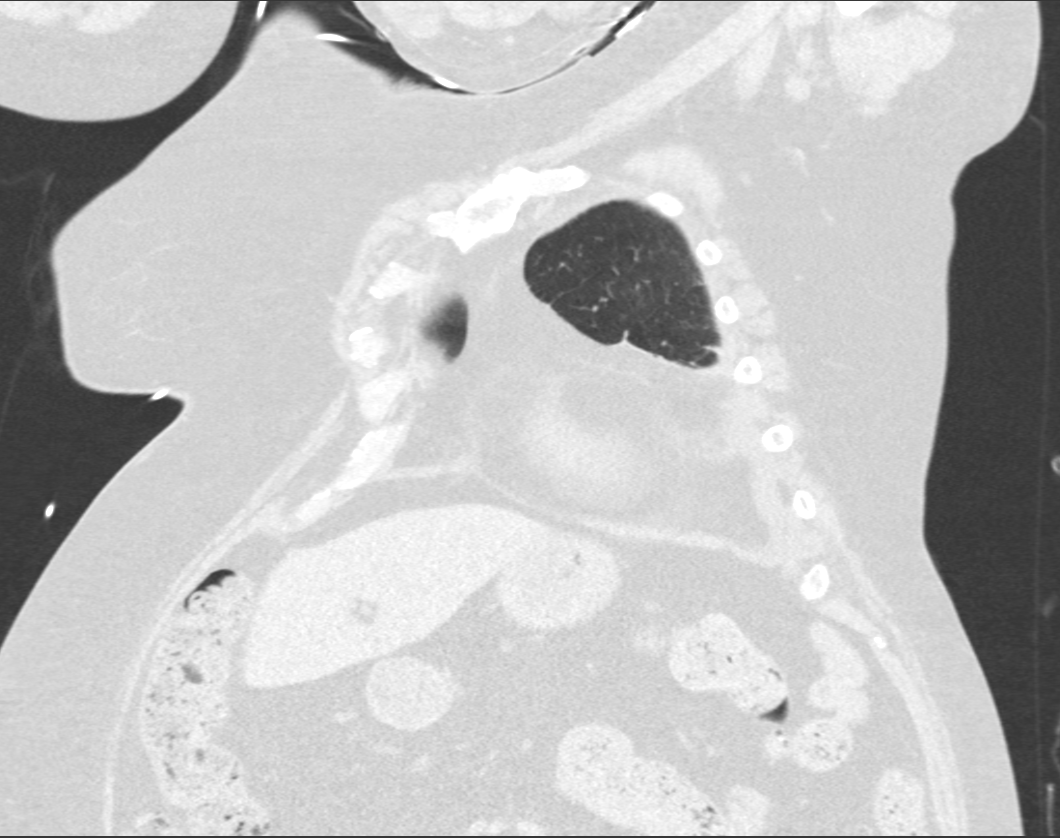
[im 109/181  lung]
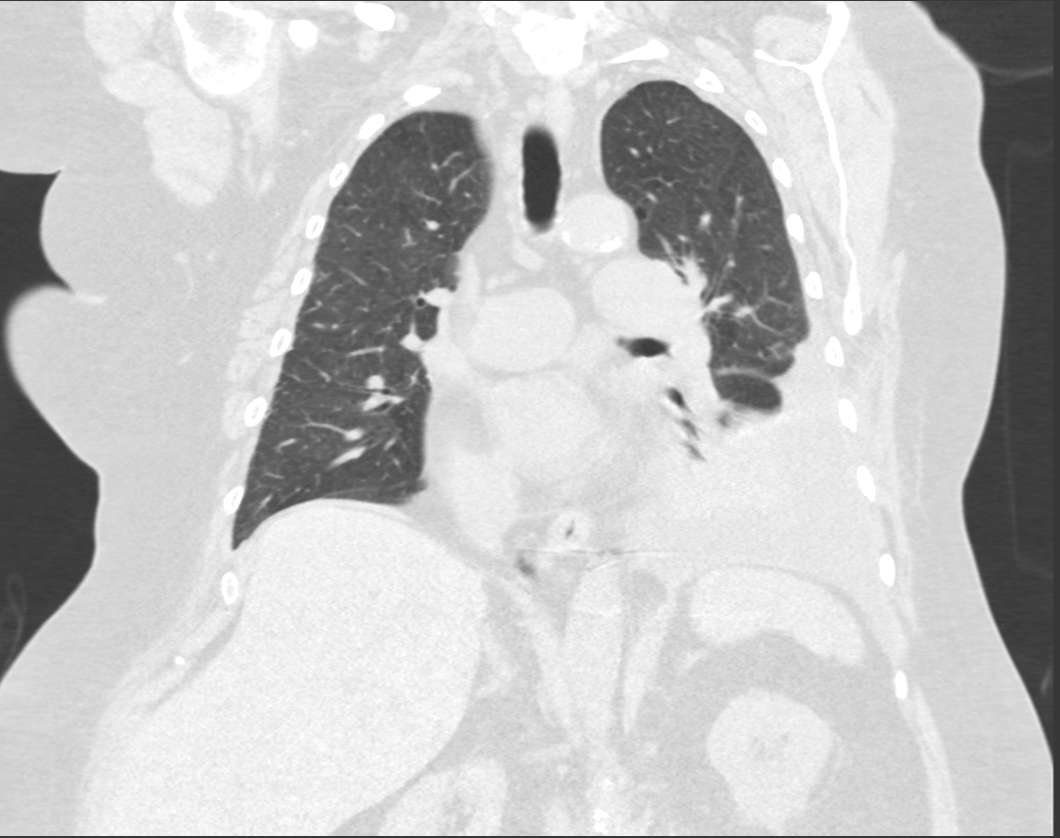

[9 of 36 positions shown; findings below may reference images not displayed]

FINDINGS: Cardiovascular: Mild cardiomegaly. Prominent coronary artery
calcifications. Trace pericardial fluid. Prominent epicardial fat.

Aortic calcifications. Ascending thoracic aorta measures up to
cm. Calcification great vessels.

Prominent main pulmonary artery measuring up to 4 cm consistent with
patient's known pulmonary hypertension.

Mediastinum/Nodes: Mediastinal lymph nodes once again noted majority
of which were noted previously including slightly nodular
configuration of lower right pretracheal lymph node with maximal
short axis dimension of 9.9 mm.

Small hiatal hernia post prior fundoplication.

Lungs/Pleura: Persistent consolidation left lower lobe with air
bronchograms without central obstructing lesion. Increase in size of
left-sided pleural effusion/pleural thickening. This pleural
thickening has masslike appearance peripheral aspect left lung base.
Some of these findings may represent post therapy changes secondary
to treatment of patient's known lung cancer although superimposed
infectious infiltrate would be difficult to exclude a proper
clinical setting.

Interval enlargement/ development of pleural based nodularity left
lung apex measuring up to 9.4 mm and 6.6 mm (series 205, images 19
and 24) raises possibility of spread of tumor.

Interval development of pulmonary parenchymal changes right lung
(most notable right middle lobe and right lower lobe) has an
appearance more suggestive of atelectasis/scarring than tumor.

Upper Abdomen: Postsurgical changes upper stomach/esophagus. Taking
into account limitation by non contrast imaging, limited imaging of
the liver, spleen, pancreas, kidneys and adrenal gland without
evidence of metastatic disease. Gallbladder is full without
calcified gallstone. Atherosclerotic changes of the aorta with
infrarenal aneurysm incompletely assessed measuring up to 3.3 x
cm transverse dimension.

Musculoskeletal: Degenerative changes thoracic spine. No osseous
destructive lesion.

Asymmetric dense breast parenchyma containing calcifications as
noted previously. Correlation with mammography recommended on
elective basis if not already performed.
IMPRESSION: Persistent consolidation left lower lobe. Increase in size of
left-sided pleural effusion/pleural thickening. Findings may
represent post therapy changes secondary to treatment of patient's
known lung cancer although superimposed infectious infiltrate would
be difficult to exclude a proper clinical setting.

Progressive pleural based nodularity left lung apex measuring up to
9.4 mm and 6.6 mm (series 205, images 19 and 24) raises possibility
of spread of tumor.

Interval development of pulmonary parenchymal changes right lung has
an appearance more suggestive of atelectasis/scarring than tumor.

Findings of pulmonary hypertension as previously noted.

Cardiomegaly with coronary artery calcifications.

Atherosclerotic changes thoracic and abdominal aorta with infrarenal
abdominal aortic aneurysm incompletely assessed with maximal
transverse dimension 3.3 x 3.2 cm.

Asymmetric dense breast parenchyma containing calcifications as
noted previously. Correlation with mammography recommended on
elective basis if not already performed.

## 2019-02-22 IMAGING — US IR THORACENTESIS ASP PLEURAL SPACE W/IMG GUIDE
1 series · 2 of 2 positions shown · non-contrast
Comparison: none

INDICATION: History of lung cancer. COPD. Shortness of breath. Left pleural
effusion. Request for diagnostic and therapeutic thoracentesis.

[Series 1: ir (id) (id)/(id)/(id) ir · 2 of 2 slices shown]
[im 1/2]
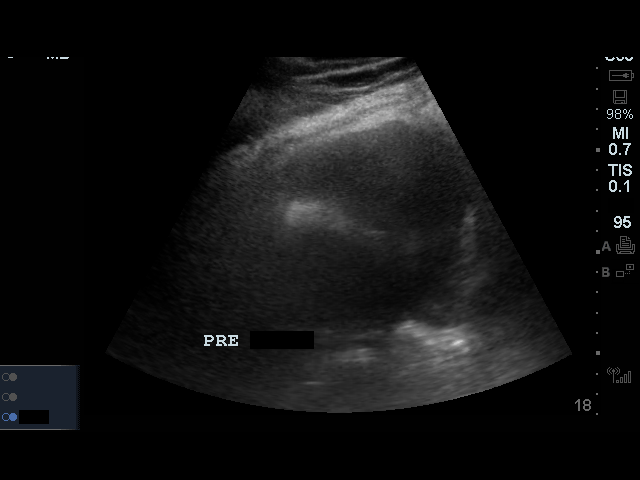
[im 2/2]
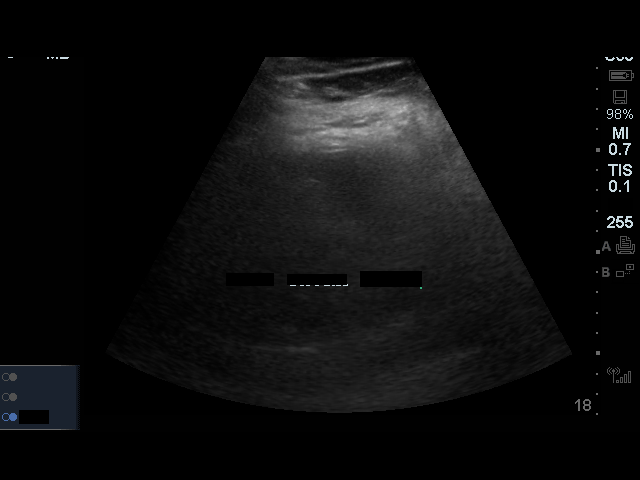

[2 of 2 positions shown; findings below may reference images not displayed]

EXAM:
ULTRASOUND GUIDED LEFT THORACENTESIS

MEDICATIONS:
1% Lidocaine = 700 mL.

COMPLICATIONS:
None immediate.

PROCEDURE:
An ultrasound guided thoracentesis was thoroughly discussed with the
patient and questions answered. The benefits, risks, alternatives
and complications were also discussed. The patient understands and
wishes to proceed with the procedure. Written consent was obtained.

Ultrasound was performed to localize and mark an adequate pocket of
fluid in the left chest. The area was then prepped and draped in the
normal sterile fashion. 1% Lidocaine was used for local anesthesia.
Under ultrasound guidance a 6 Fr Safe-T-Centesis catheter was
introduced. Thoracentesis was performed. The catheter was removed
and a dressing applied.
FINDINGS: A total of approximately 700 mL of amber fluid was removed. Samples
were sent to the laboratory as requested by the clinical team.
IMPRESSION: Successful ultrasound guided left thoracentesis yielding 700 mL of
pleural fluid.

## 2019-03-30 IMAGING — CT CT CHEST W/O CM
2 of 3 series · 15 of 36 positions shown, 18 images · non-contrast
Comparison: 07/18/2016

CLINICAL DATA: Shortness of breath. Home oxygen. Three breathing
treatments without relief. History of CHF and COPD.

EXAM:
CT CHEST WITHOUT CONTRAST
TECHNIQUE: Multidetector CT imaging of the chest was performed following the
standard protocol without IV contrast.

[Series 3: chest w/o 2mm st · axial · non-contrast · 0.75mm/px · z∈[+1096,+1372]mm · 12 of 163 slices shown, 15 images]
[im 13/163  mediastinal]
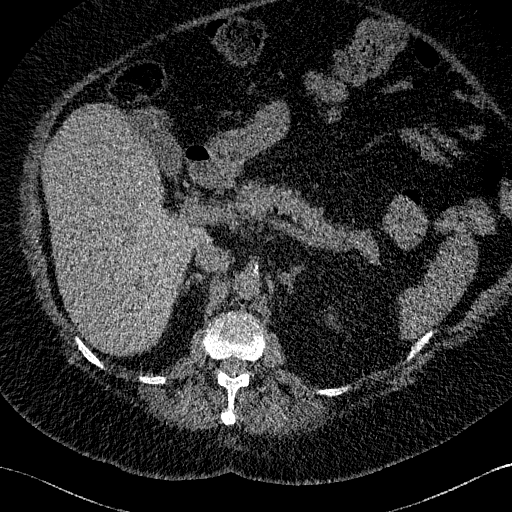
[im 13/163  lung]
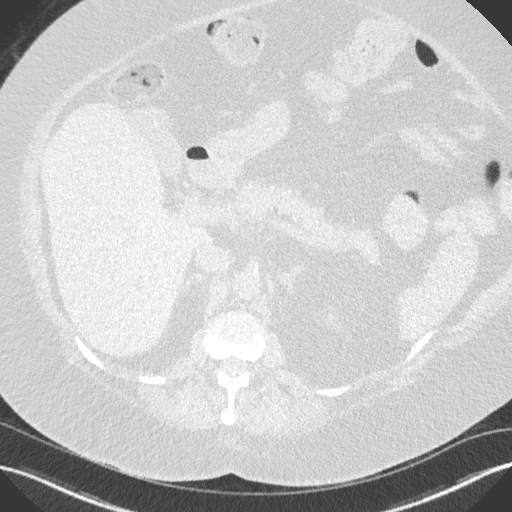
[im 25/163  lung]
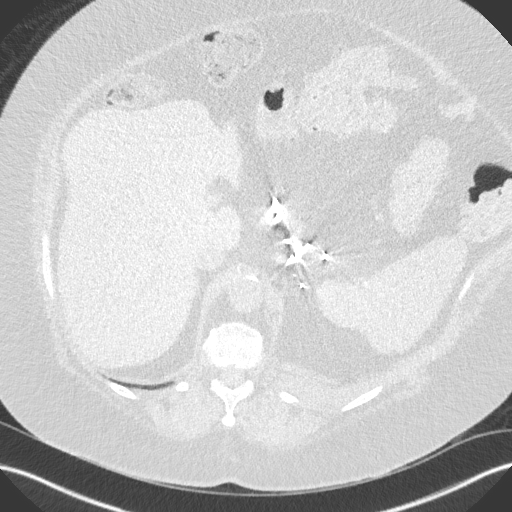
[im 37/163  lung]
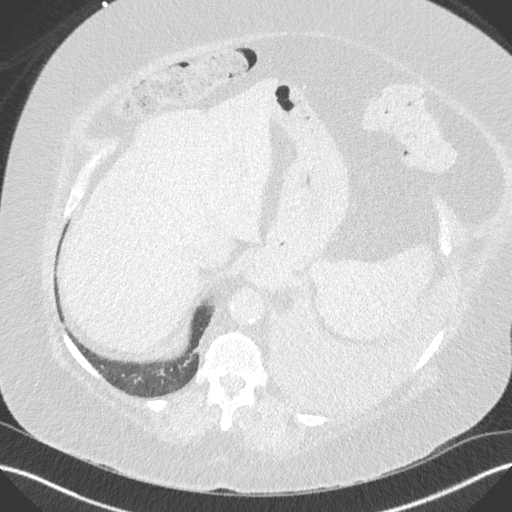
[im 49/163  lung]
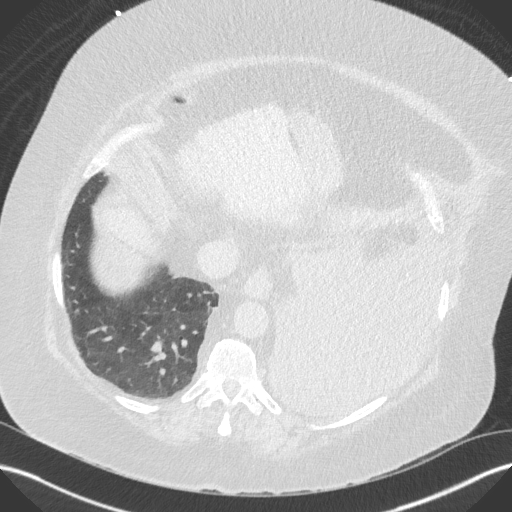
[im 61/163  mediastinal]
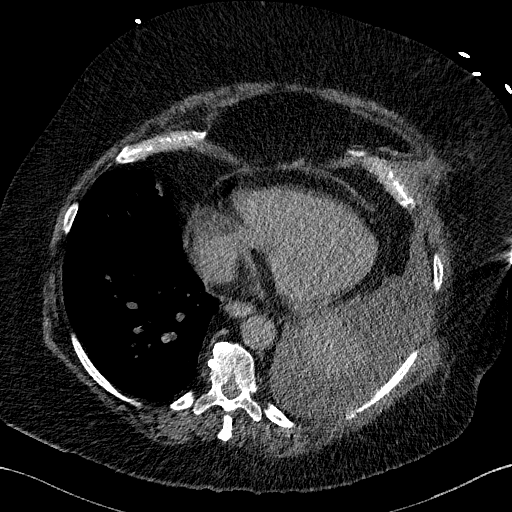
[im 61/163  lung]
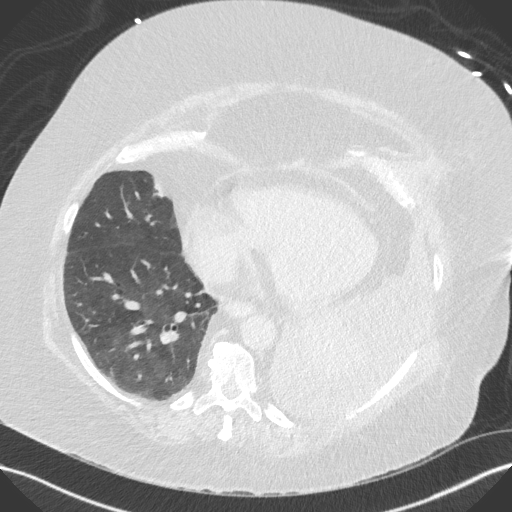
[im 73/163  lung]
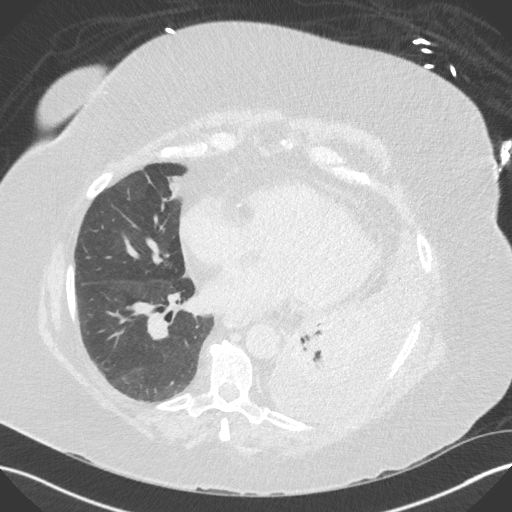
[im 91/163  lung]
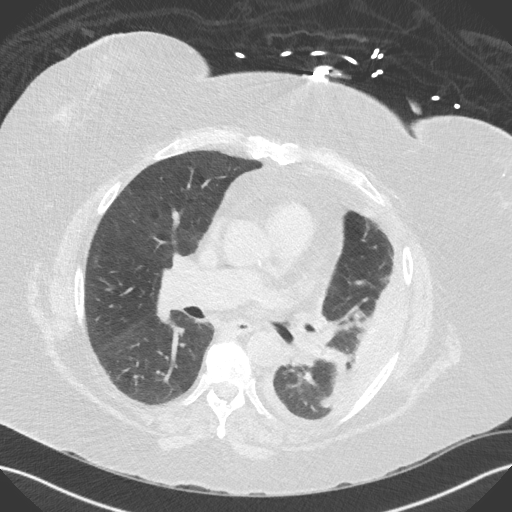
[im 103/163  lung]
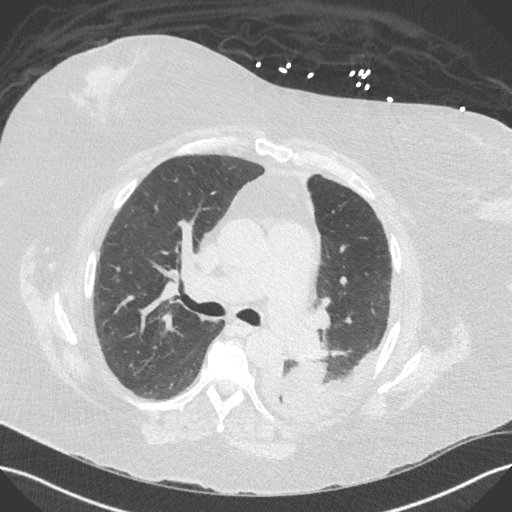
[im 115/163  mediastinal]
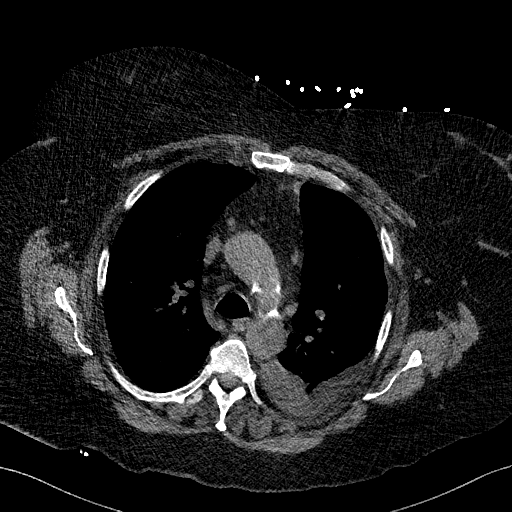
[im 115/163  lung]
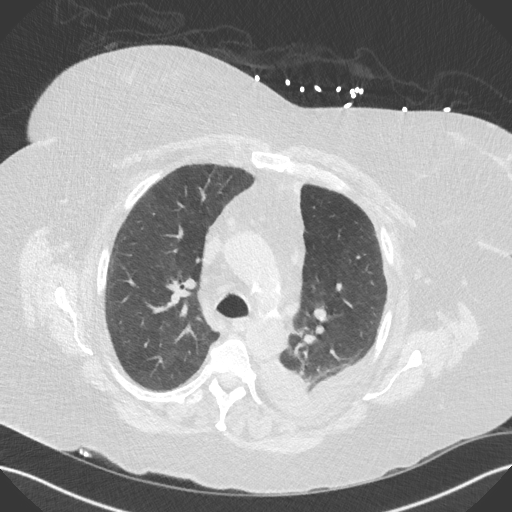
[im 127/163  lung]
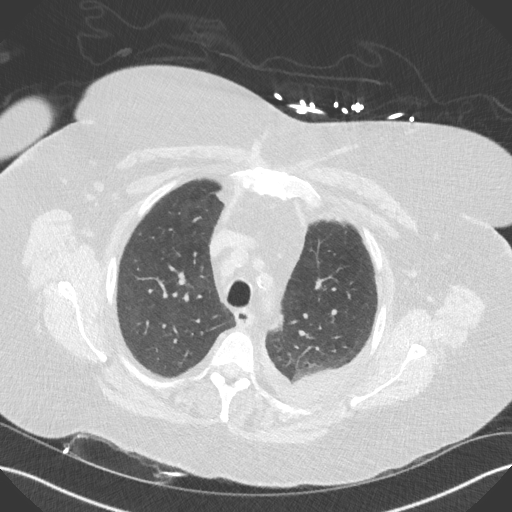
[im 139/163  lung]
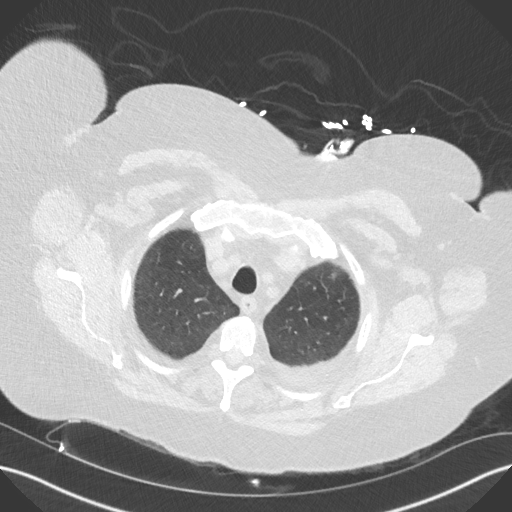
[im 151/163  lung]
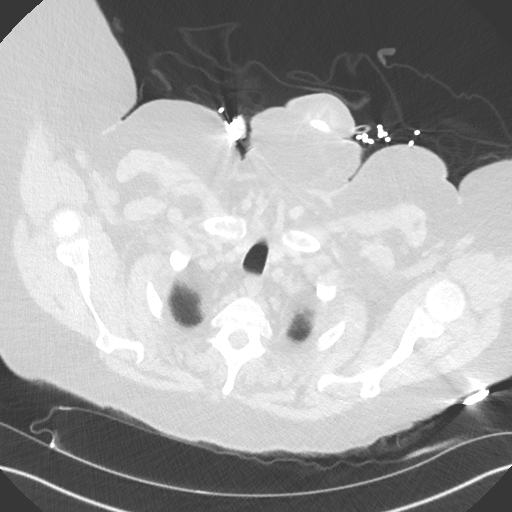

[Series 5: chest w/o 3mm st cor · coronal · non-contrast · 0.63mm/px · 3 of 81 slices shown]
[im 17/81  lung]
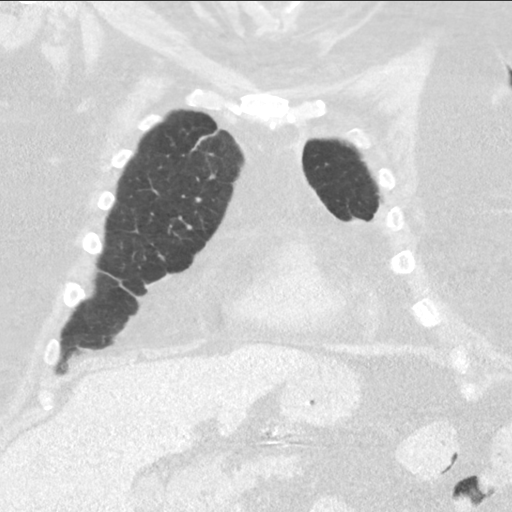
[im 33/81  lung]
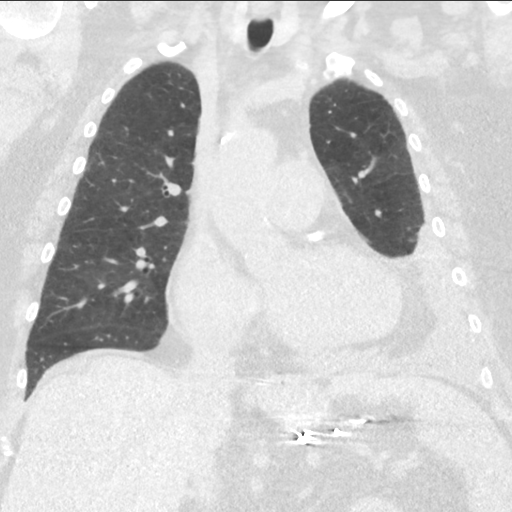
[im 49/81  lung]
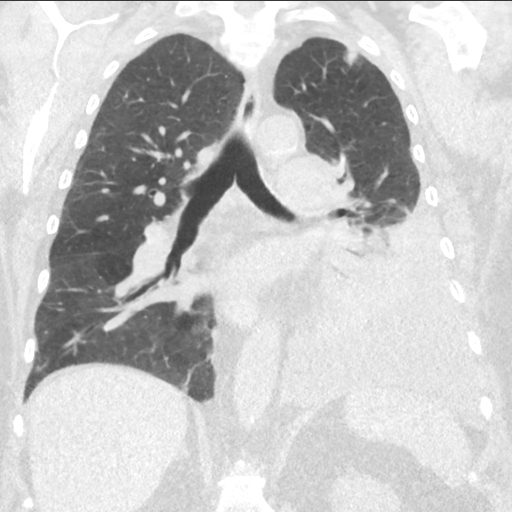

[15 of 36 positions shown; findings below may reference images not displayed]

FINDINGS: Cardiovascular: Normal heart size. No pericardial effusions.
Coronary artery and aortic calcifications. No aortic aneurysm.
Central pulmonary arteries are prominent suggesting arterial
hypertension.

Mediastinum/Nodes: Scattered lymph nodes are not pathologically
enlarged. Esophagus is decompressed. Postoperative changes at the EG
junction.

Lungs/Pleura: Moderate size left pleural effusion. Atelectasis or
consolidation in the left lung base. 11 mm and 6 mm nodules in the
left upper lung without change since prior study. No pneumothorax.
Emphysematous changes demonstrated in the lungs. Airways are patent.

Upper Abdomen: Surgical clips in the upper abdomen. No acute process
identified on limited evaluation.

Musculoskeletal: Degenerative changes in the spine. No destructive
bone lesions.
IMPRESSION: *Moderate left pleural effusion with consolidation and volume loss
in the lung base. Left upper lung nodules, largest measuring 11 mm
diameter, similar to prior study. Metastasis not excluded.
*Vascular calcifications. Central pulmonary arterial prominence
consistent with pulmonary arterial hypertension.

## 2019-03-30 IMAGING — DX DG ABDOMEN 1V
2 series · 2 of 2 positions shown · non-contrast
Comparison: CT chest 08/23/2016.  Abdominal series 04/15/2016.

CLINICAL DATA: No bowel movement for 18 days.  Abdominal pain .

EXAM:
ABDOMEN - 1 VIEW

[abdomen kub (1 of 2)]
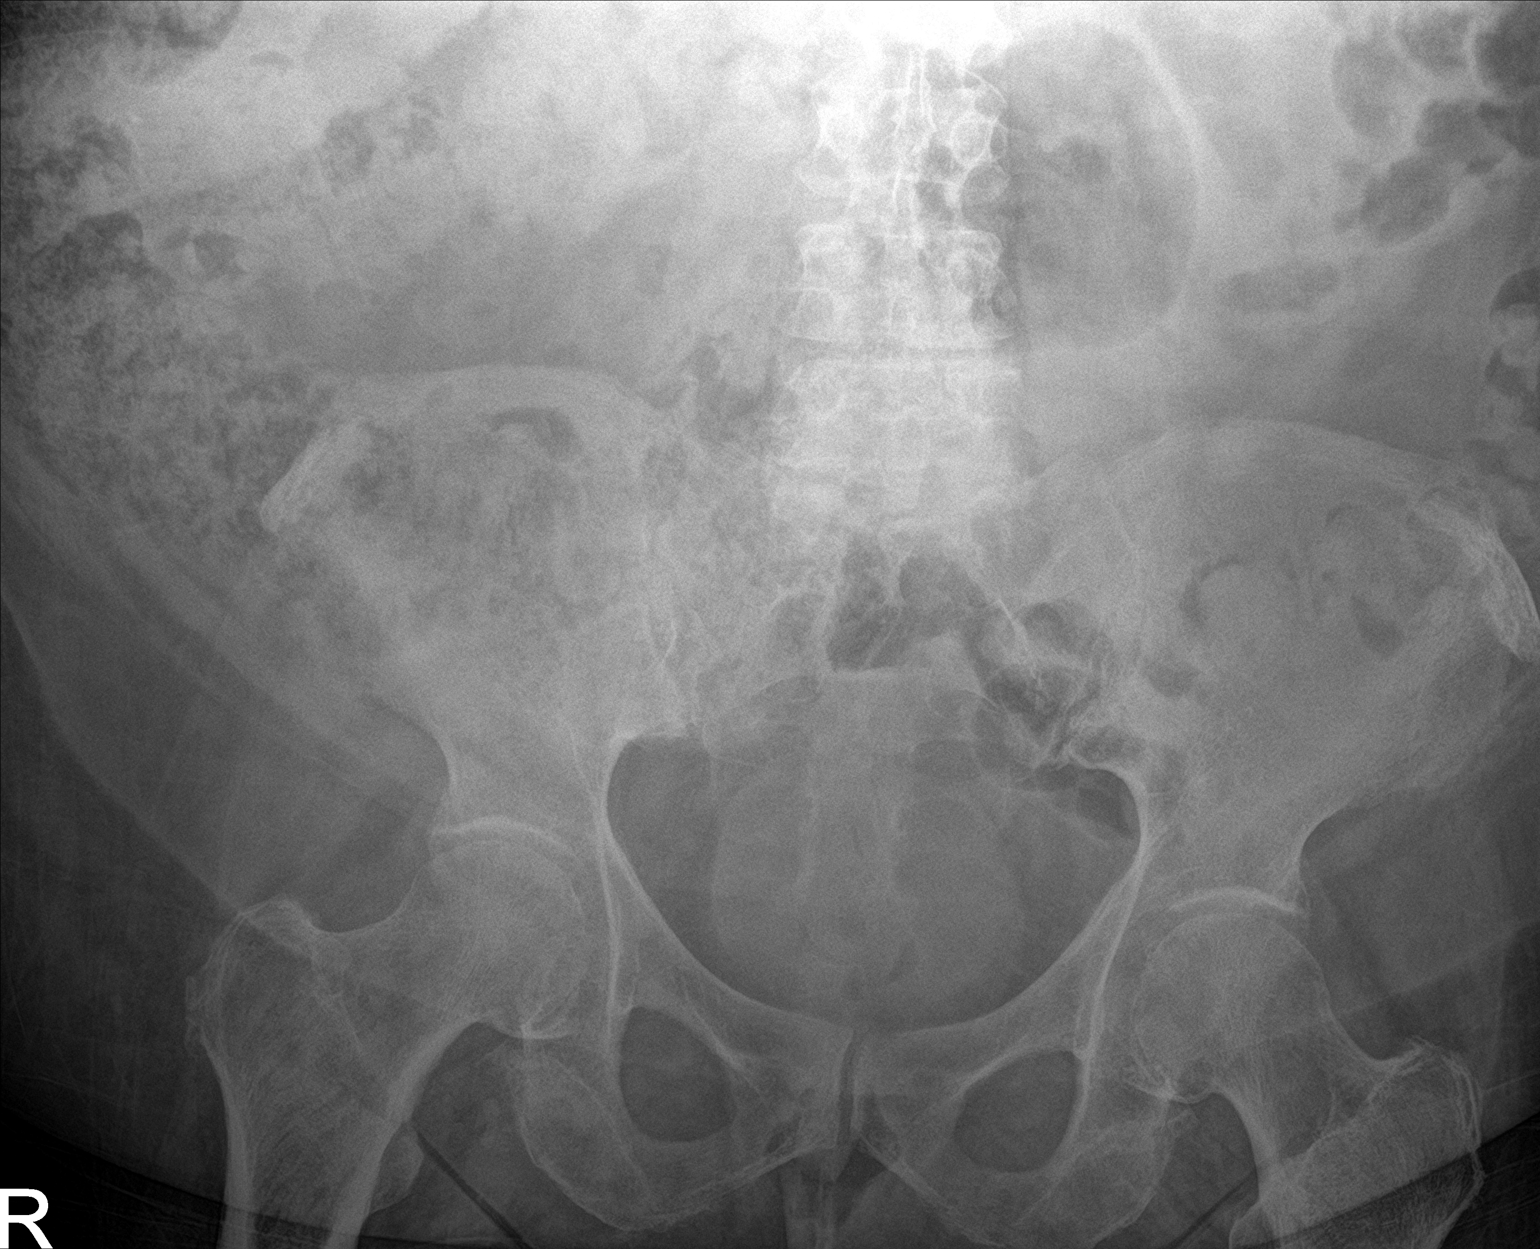

[abdomen kub (2 of 2)]
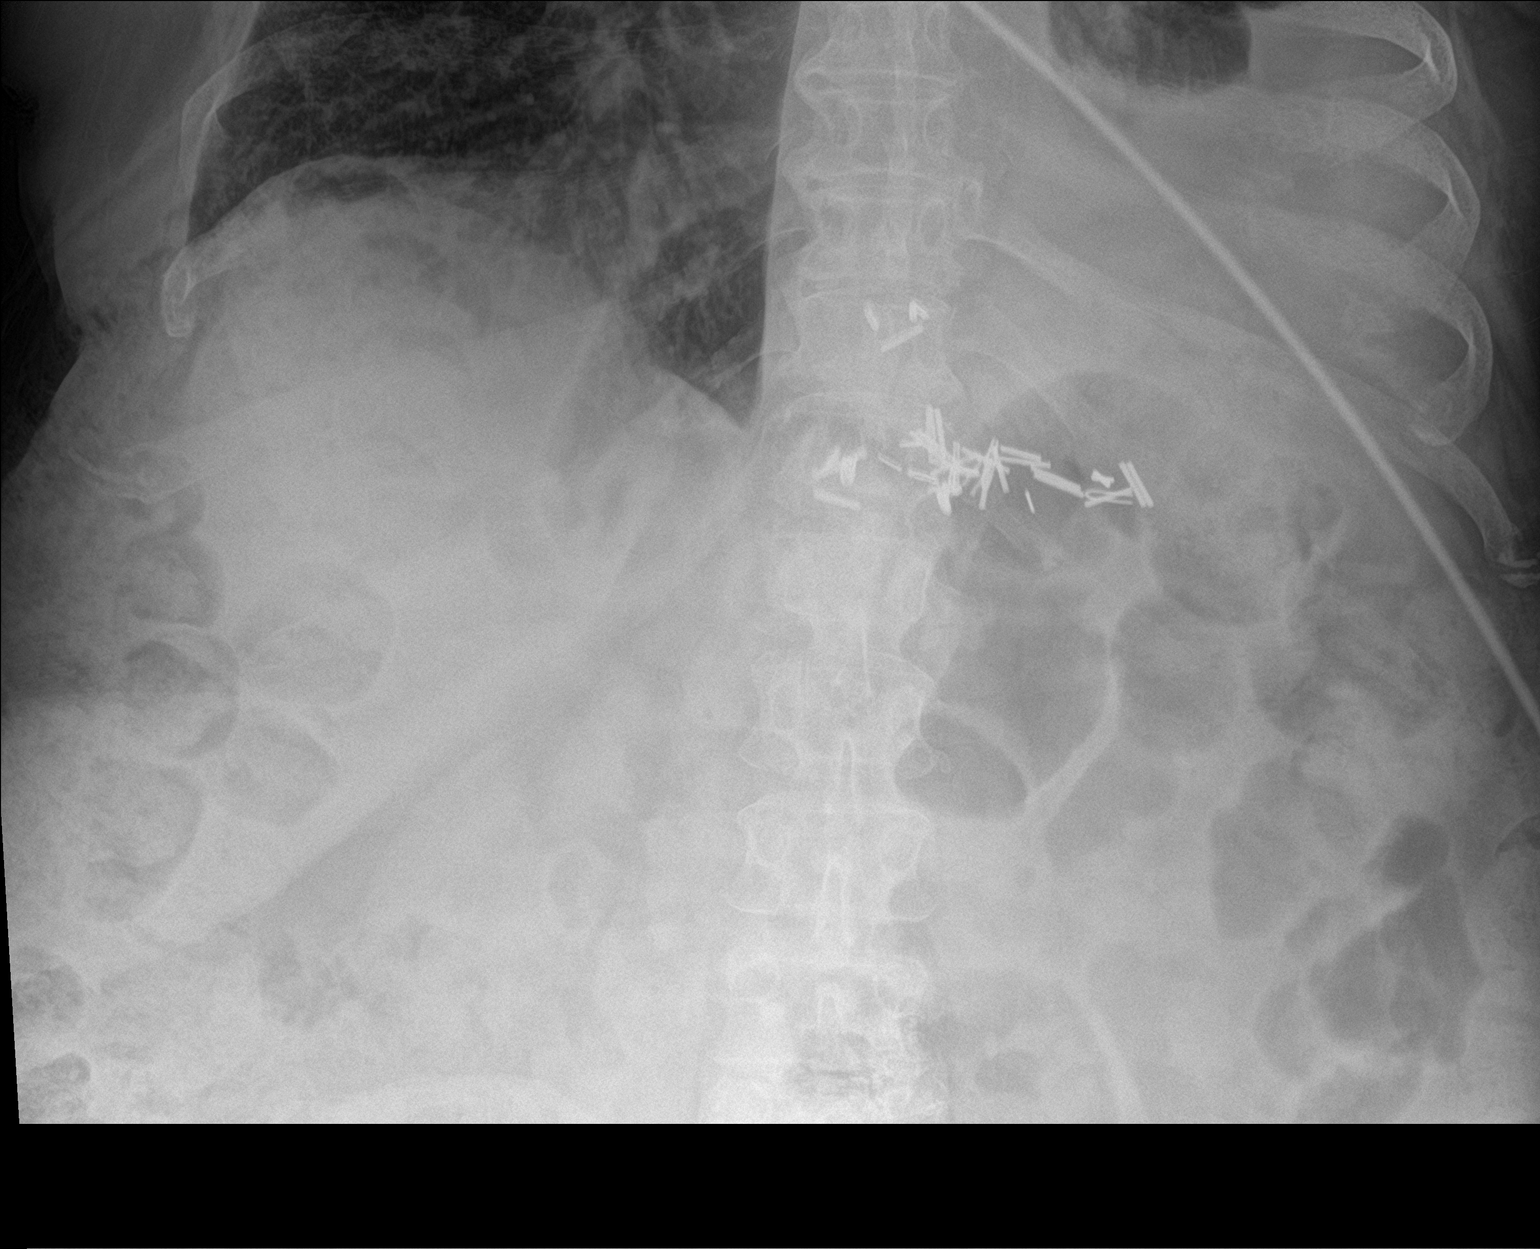

[2 of 2 positions shown; findings below may reference images not displayed]

FINDINGS: Surgical clips noted in the mid upper abdomen. Large amount of stool
is again noted throughout the colon suggesting constipation.
Interposition of colon under the right hemidiaphragm again noted. No
bowel distention. No free air. Degenerative changes lumbar spine.
Basilar atelectasis and left pleural effusion again noted .
IMPRESSION: 1. Large amount of stool noted throughout the colon suggesting
constipation.

2. Bibasilar atelectasis and left-sided pleural effusion again noted
.

## 2019-03-31 IMAGING — DX DG CHEST 1V
1 series · 1 of 1 positions shown · non-contrast
Comparison: CT 08/23/2016.  Chest x-ray 08/23/2016.

CLINICAL DATA: Left-sided thoracentesis.  Shortness of breath.

EXAM:
CHEST 1 VIEW

[chest ap]
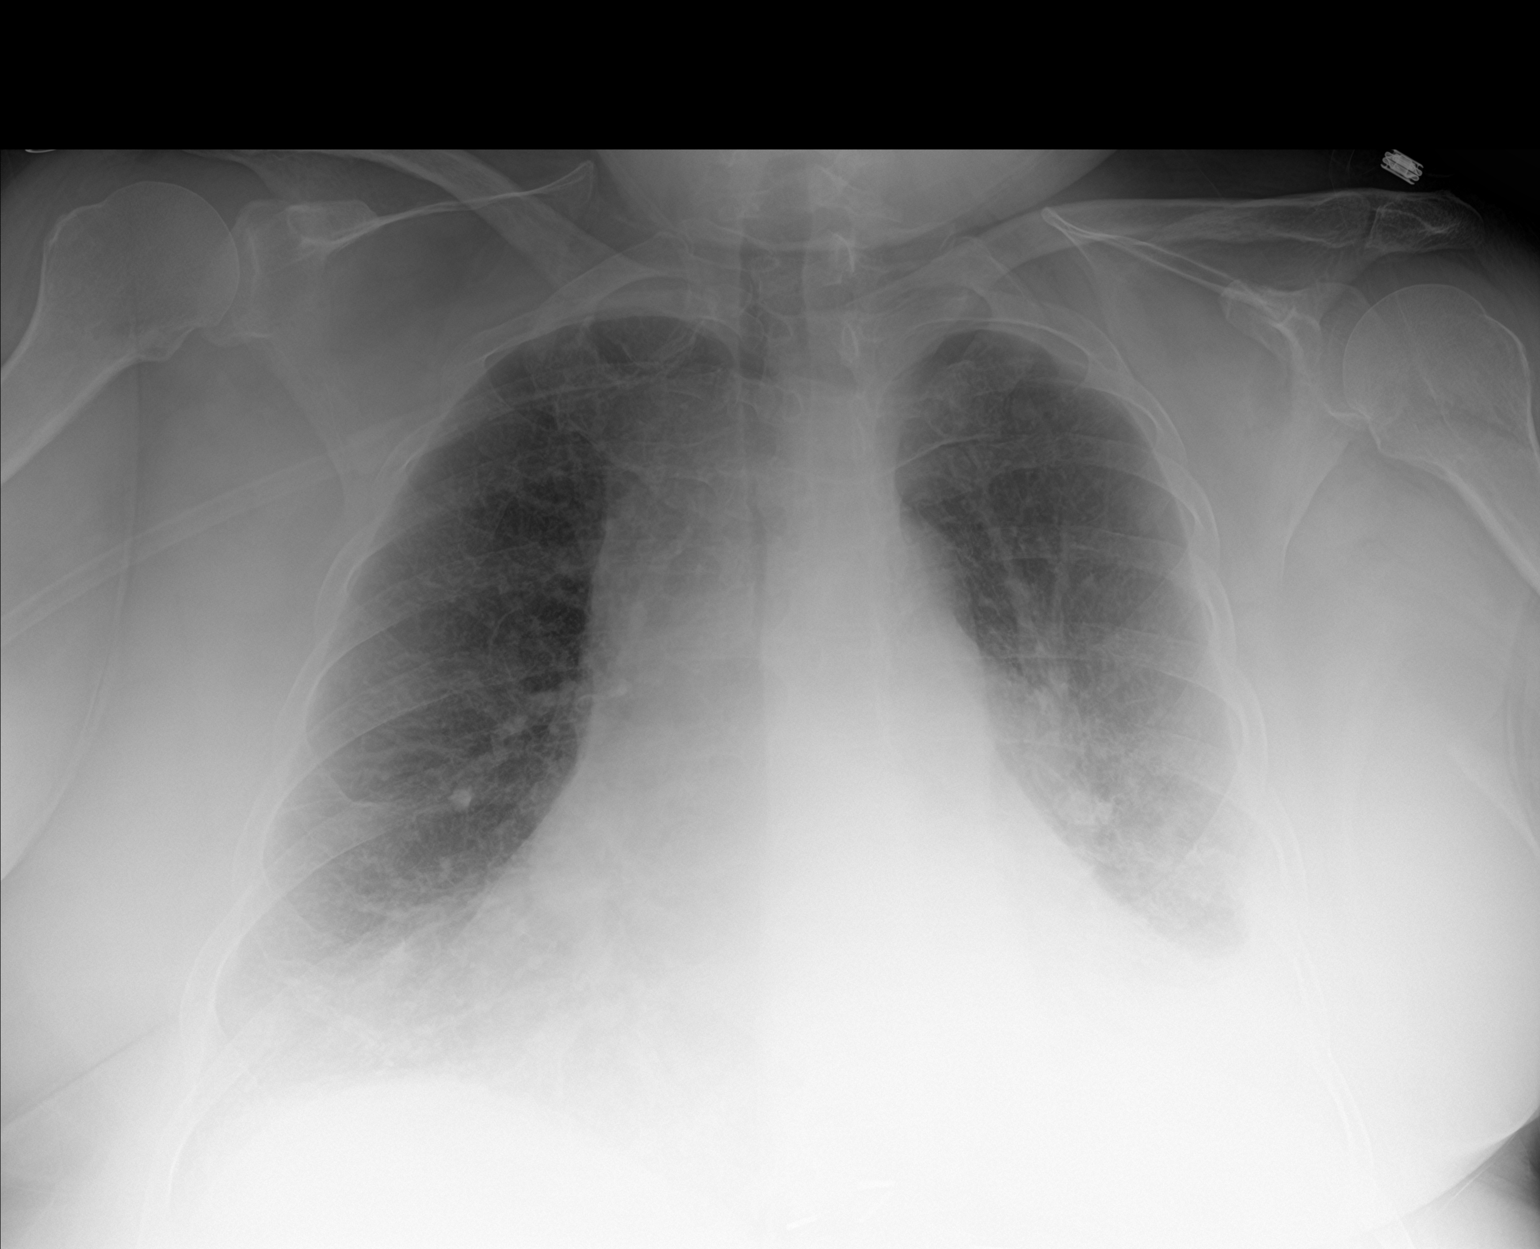

[1 of 1 positions shown; findings below may reference images not displayed]

FINDINGS: Mediastinum and hilar structures normal. Cardiomegaly with normal
pulmonary vascularity. Interim improvement of bilateral pulmonary
interstitial prominence consistent with improving interstitial
edema. Reference is made to recent chest CT report for discussion of
pulmonary nodules present. Interim improvement of left pleural
effusion post thoracentesis. No evidence of pneumothorax post
thoracentesis
IMPRESSION: No evidence of pneumothorax post thoracentesis.

## 2019-03-31 IMAGING — US IR THORACENTESIS ASP PLEURAL SPACE W/IMG GUIDE
1 series · 1 of 1 positions shown · non-contrast
Comparison: none

INDICATION: Left upper lung nodule on recent CT scan with moderate left pleural
effusion. Request is made for therapeutic and diagnostic left-sided
thoracentesis.

[Series 1: ir (id) (id)/(id)/(id) ir · 1 of 1 slices shown]
[im 1/1]
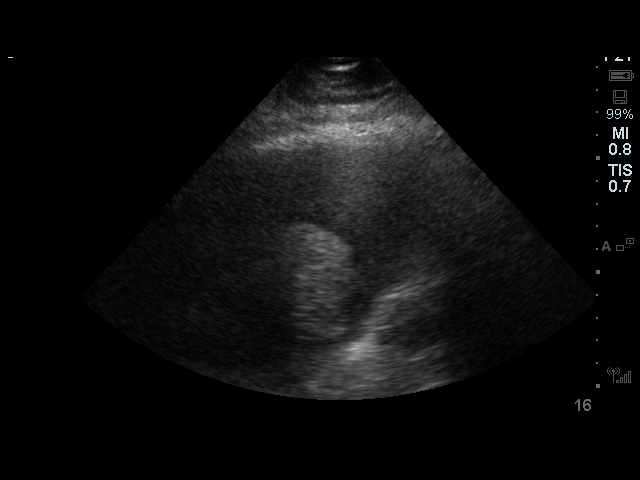

[1 of 1 positions shown; findings below may reference images not displayed]

EXAM:
ULTRASOUND GUIDED DIAGNOSTIC AND THERAPEUTIC THORACENTESIS

MEDICATIONS:
1% lidocaine

COMPLICATIONS:
None immediate.

PROCEDURE:
An ultrasound guided thoracentesis was thoroughly discussed with the
patient and questions answered. The benefits, risks, alternatives
and complications were also discussed. The patient understands and
wishes to proceed with the procedure. Written consent was obtained.

Ultrasound was performed to localize and mark an adequate pocket of
fluid in the left chest. The area was then prepped and draped in the
normal sterile fashion. 1% Lidocaine was used for local anesthesia.
Under ultrasound guidance a Safe-T-Centesis catheter was introduced.
Thoracentesis was performed. The catheter was removed and a dressing
applied.
FINDINGS: A total of approximately 0.65 L of serosanguineous fluid was
removed. Samples were sent to the laboratory as requested by the
clinical team.
IMPRESSION: Successful ultrasound guided left thoracentesis yielding 0.65 L of
pleural fluid.

Follow-up chest radiograph shows no pneumothorax.

## 2019-04-27 IMAGING — DX DG CHEST 1V PORT
1 series · 1 of 1 positions shown · non-contrast
Comparison: Chest radiograph performed 08/24/2016

CLINICAL DATA: Acute onset of shortness of breath and productive
cough. Initial encounter.

EXAM:
PORTABLE CHEST 1 VIEW

[chest ap]
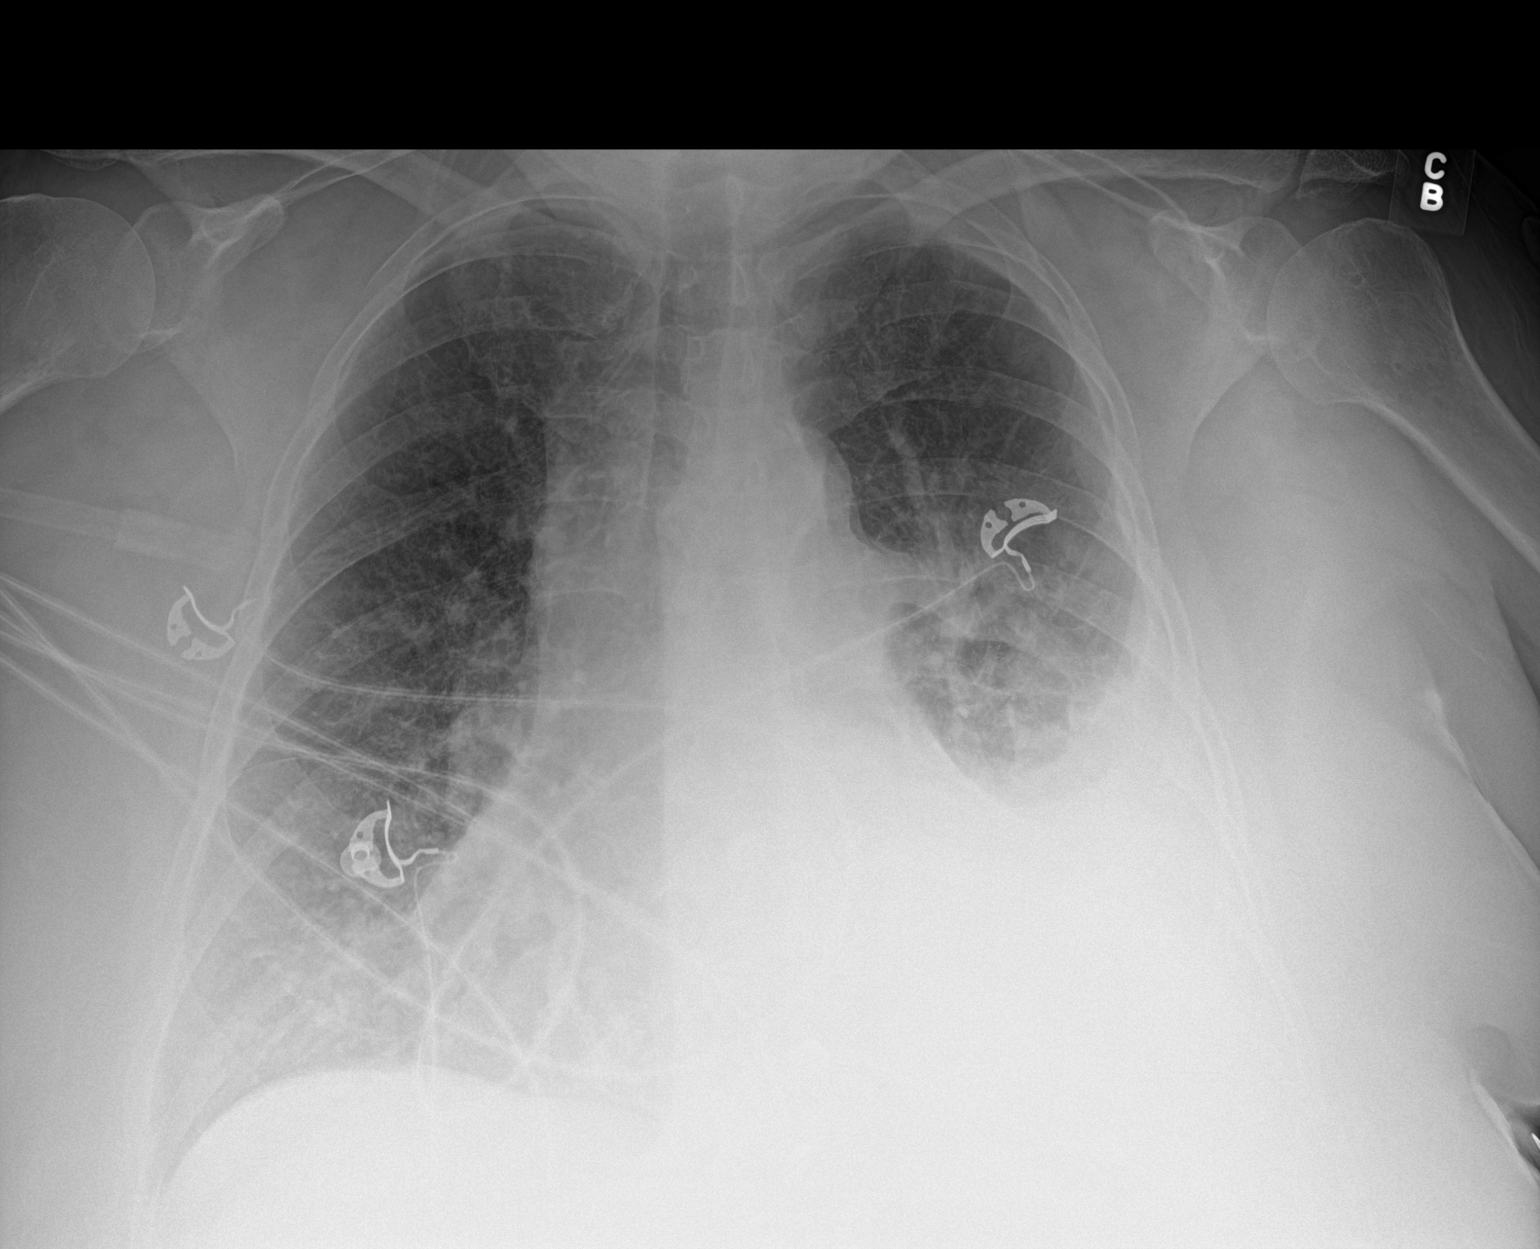

[1 of 1 positions shown; findings below may reference images not displayed]

FINDINGS: A moderate left-sided pleural effusion is noted, increased from the
prior study. Vascular congestion is seen. Increased interstitial
markings raise concern for mild interstitial edema. There is no
evidence of pneumothorax.

The cardiomediastinal silhouette is enlarged. No acute osseous
abnormalities are seen.
IMPRESSION: Increased moderate left pleural effusion. Vascular congestion and
cardiomegaly. Increased interstitial markings raise concern for
pulmonary edema.

## 2019-04-30 IMAGING — CR DG CHEST 1V PORT
1 series · 1 of 1 positions shown · non-contrast
Comparison: 09/22/2016, chest CT 08/23/2016

CLINICAL DATA: 61-year-old female status post left-sided tunnel
pleural drainage catheter

EXAM:
PORTABLE CHEST 1 VIEW

[AP]
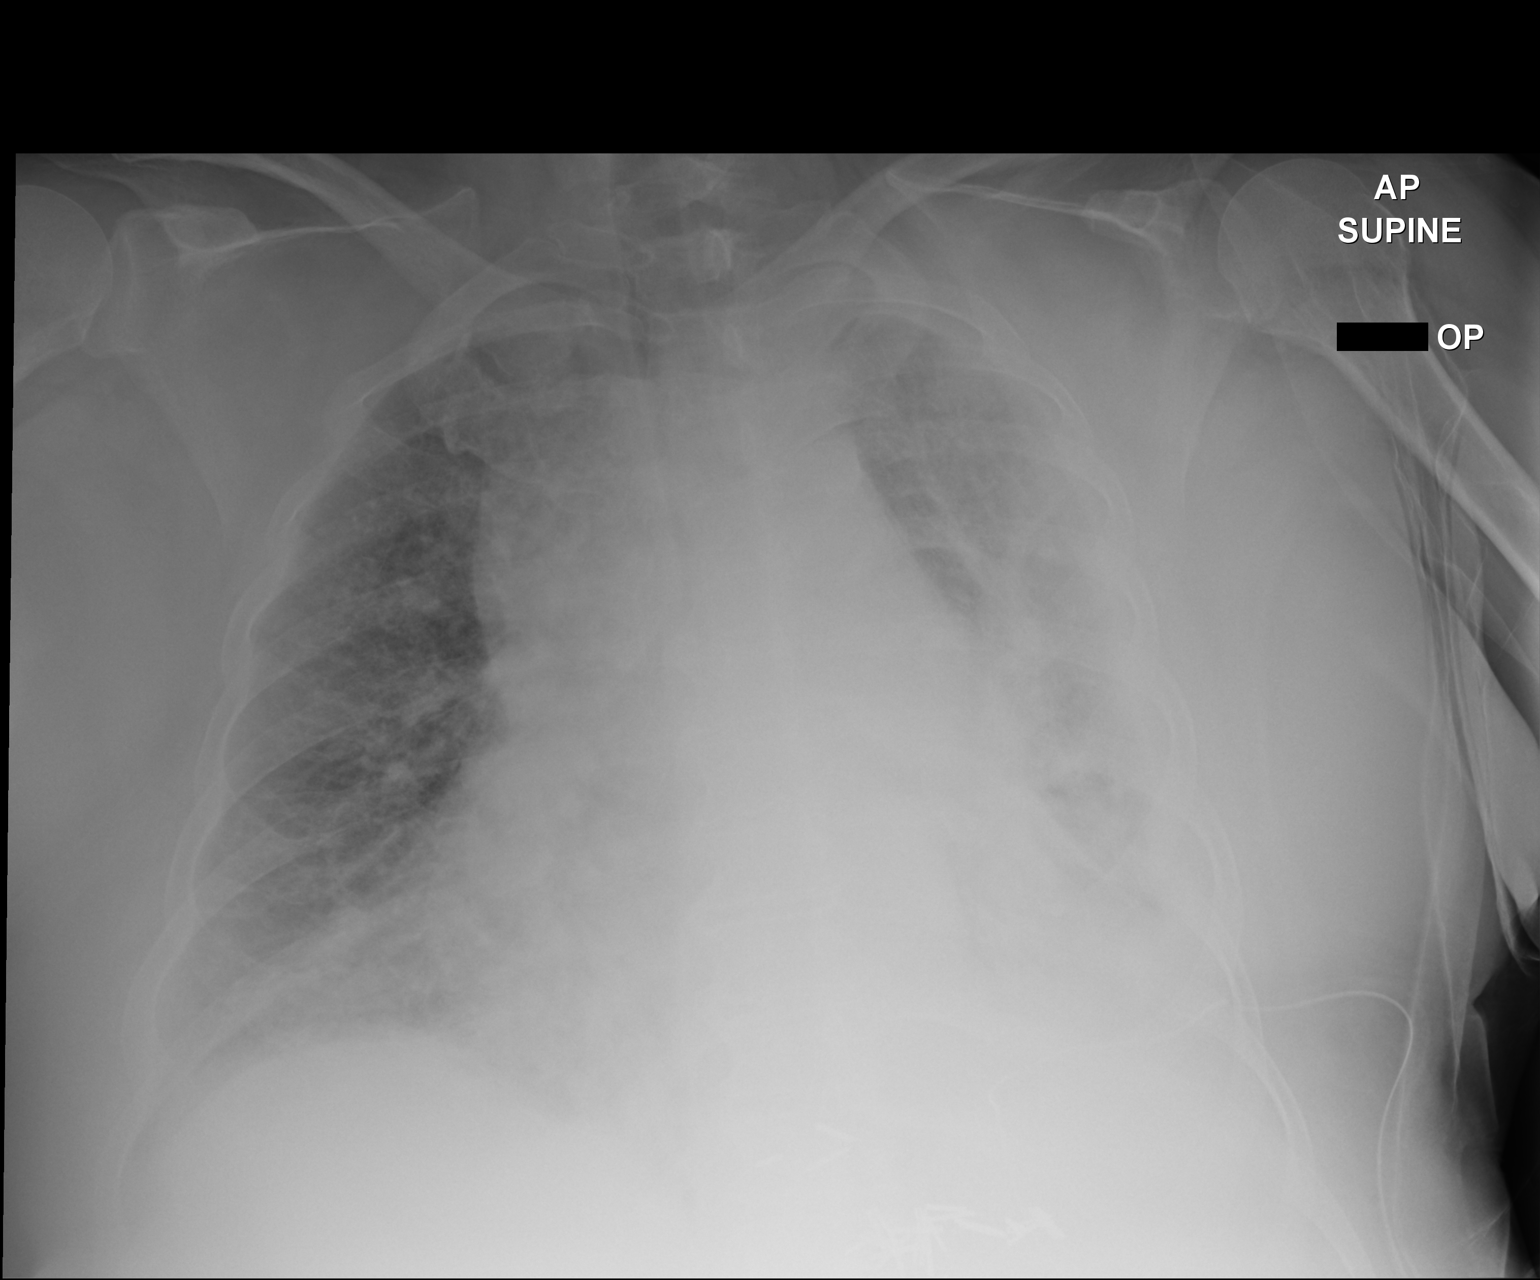

[1 of 1 positions shown; findings below may reference images not displayed]

FINDINGS: Cardiomediastinal silhouette unchanged with the heart borders
partially obscured by overlying lung and pleural disease.

Interval placement of tunneled left-sided pleural drainage catheter,
terminating at the base of the left chest. Compare to the prior
plain film there is improved left-sided pleural effusion, with
persisting fluid and opacity at the left base.

No pneumothorax.

Aeration on the right chest unchanged with mixed interstitial and
airspace opacities. No large right-sided pleural effusion.
IMPRESSION: Interval placement of left-sided tunneled pleural drainage catheter,
terminating at the base of the left chest.

Improved left-sided pleural effusion.

Mixed interstitial and airspace disease on the right with low lung
volumes.

## 2019-05-01 IMAGING — CR DG CHEST 2V
2 series · 2 of 2 positions shown · non-contrast
Comparison: 09/23/2016

CLINICAL DATA: Shortness of breath.  Chest tube.

EXAM:
CHEST  2 VIEW

[chest lat]
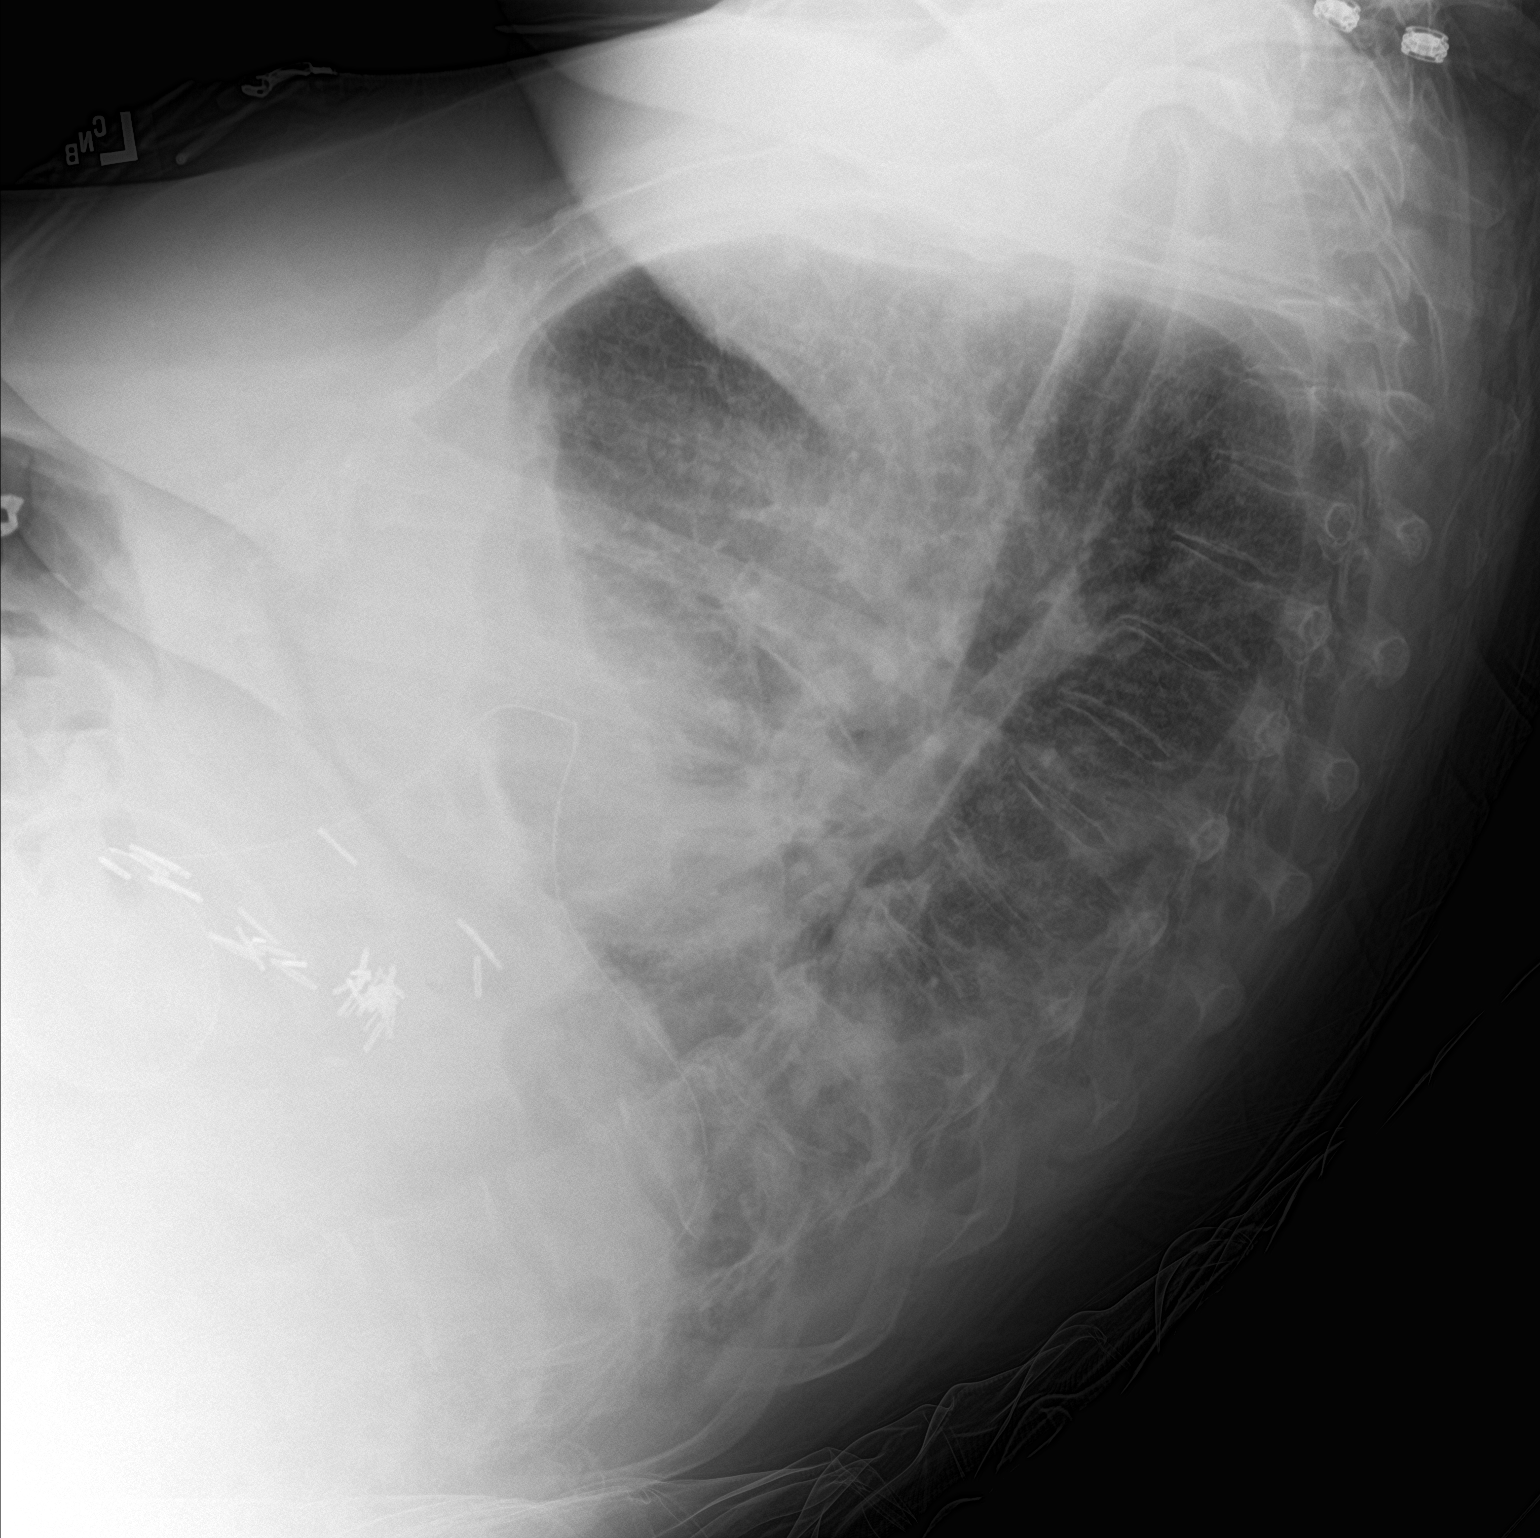

[chest ap]
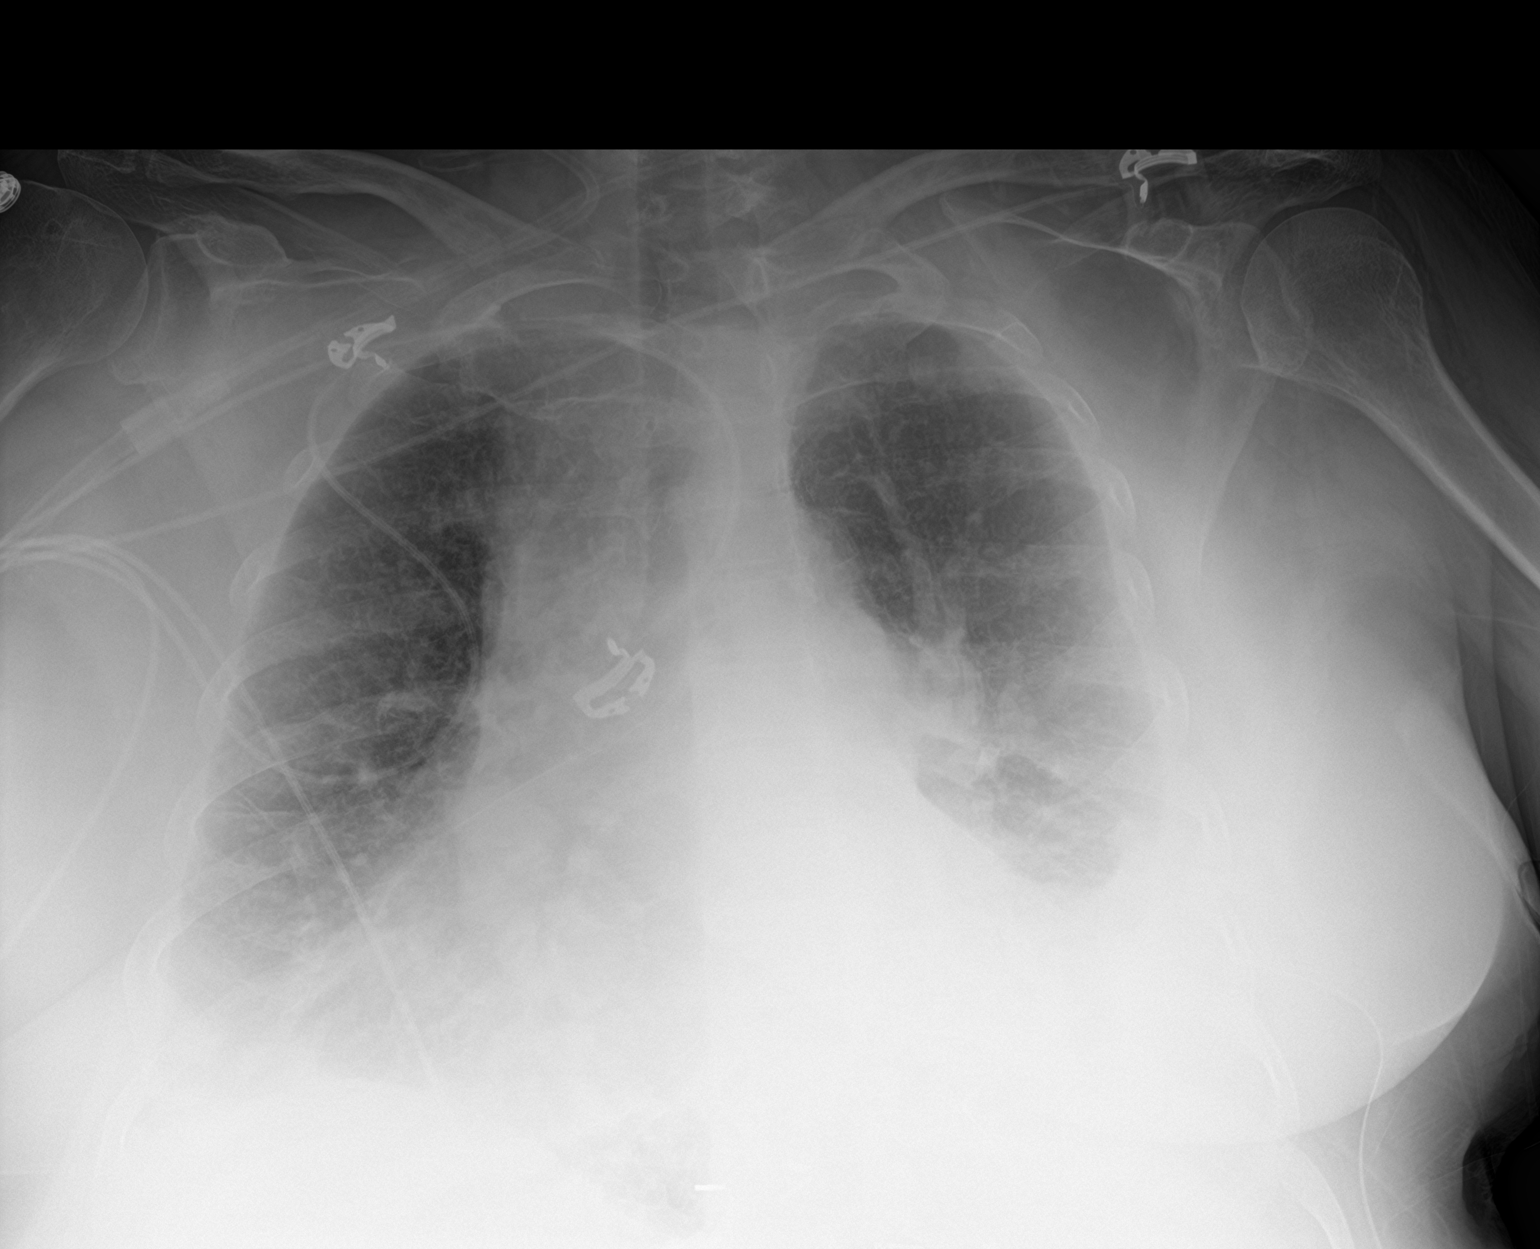

[2 of 2 positions shown; findings below may reference images not displayed]

FINDINGS: The patient is slightly rotated to the right on the AP image. The
cardiomediastinal silhouette is unchanged, with the heart again
appearing enlarged. A left basilar chest tube is again noted. Left
basilar opacity has mildly increased and may reflect a combination
of increasing airspace consolidation and small to moderate-sized
pleural effusion. Left upper lobe aeration has improved. Patchy
right basilar opacities are stable to mildly increased, and there
may be a small right pleural effusion. No pneumothorax is
identified.
IMPRESSION: 1. Worsening left basilar aeration which may reflect a combination
of increasing lung consolidation/atelectasis and pleural effusion.
2. Improved left upper lobe aeration.
3. Stable to slightly worsened right basilar aeration with likely
small pleural effusion.

## 2019-06-02 IMAGING — DX DG CHEST 2V
2 series · 2 of 2 positions shown · non-contrast
Comparison: 09/24/2016 and 09/23/2016.

CLINICAL DATA: Shortness of breath with weakness and dizziness.
History of lung cancer, congestive heart failure diabetes and
hypertension.

EXAM:
CHEST  2 VIEW

[dg chest 2 view (1 of 2)]
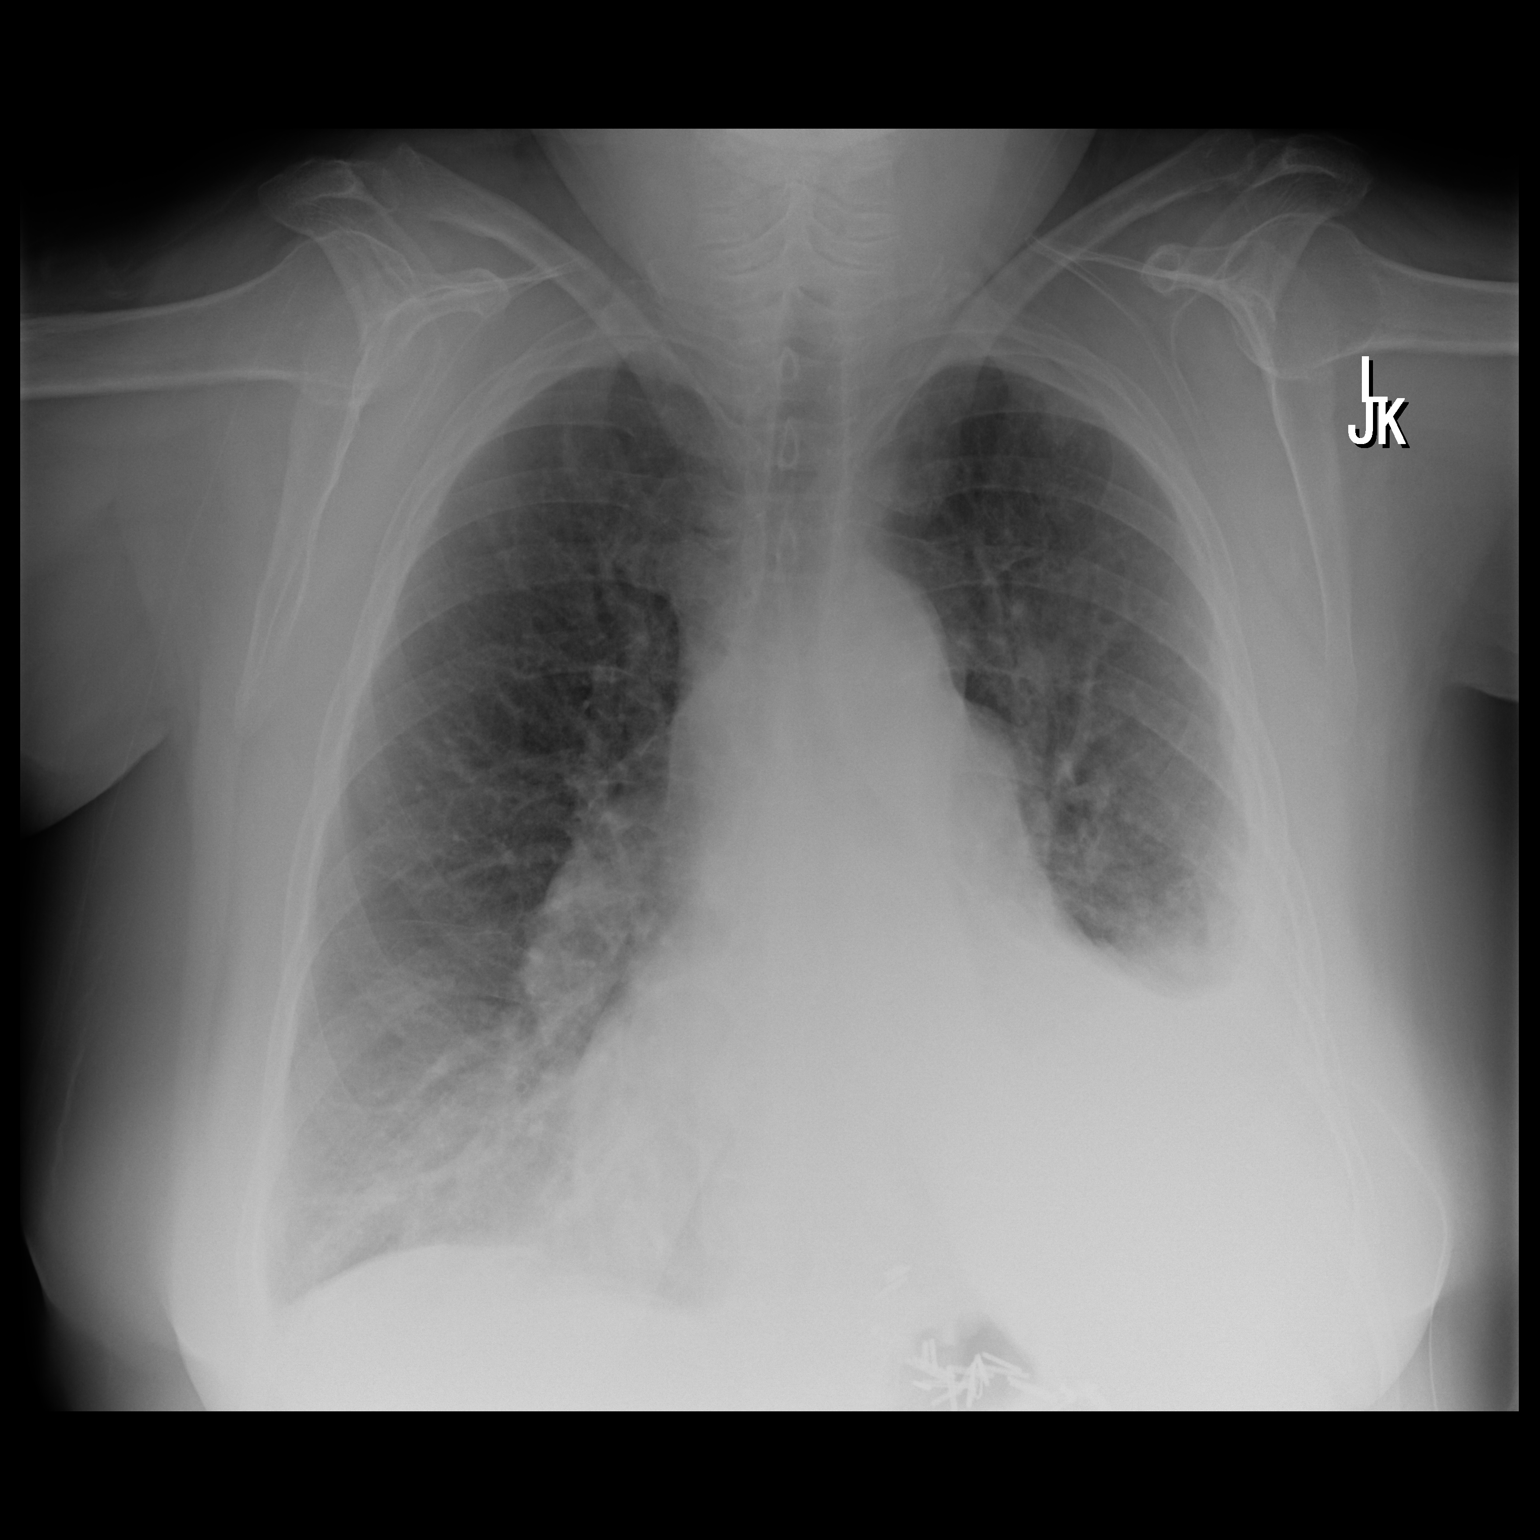

[dg chest 2 view (2 of 2)]
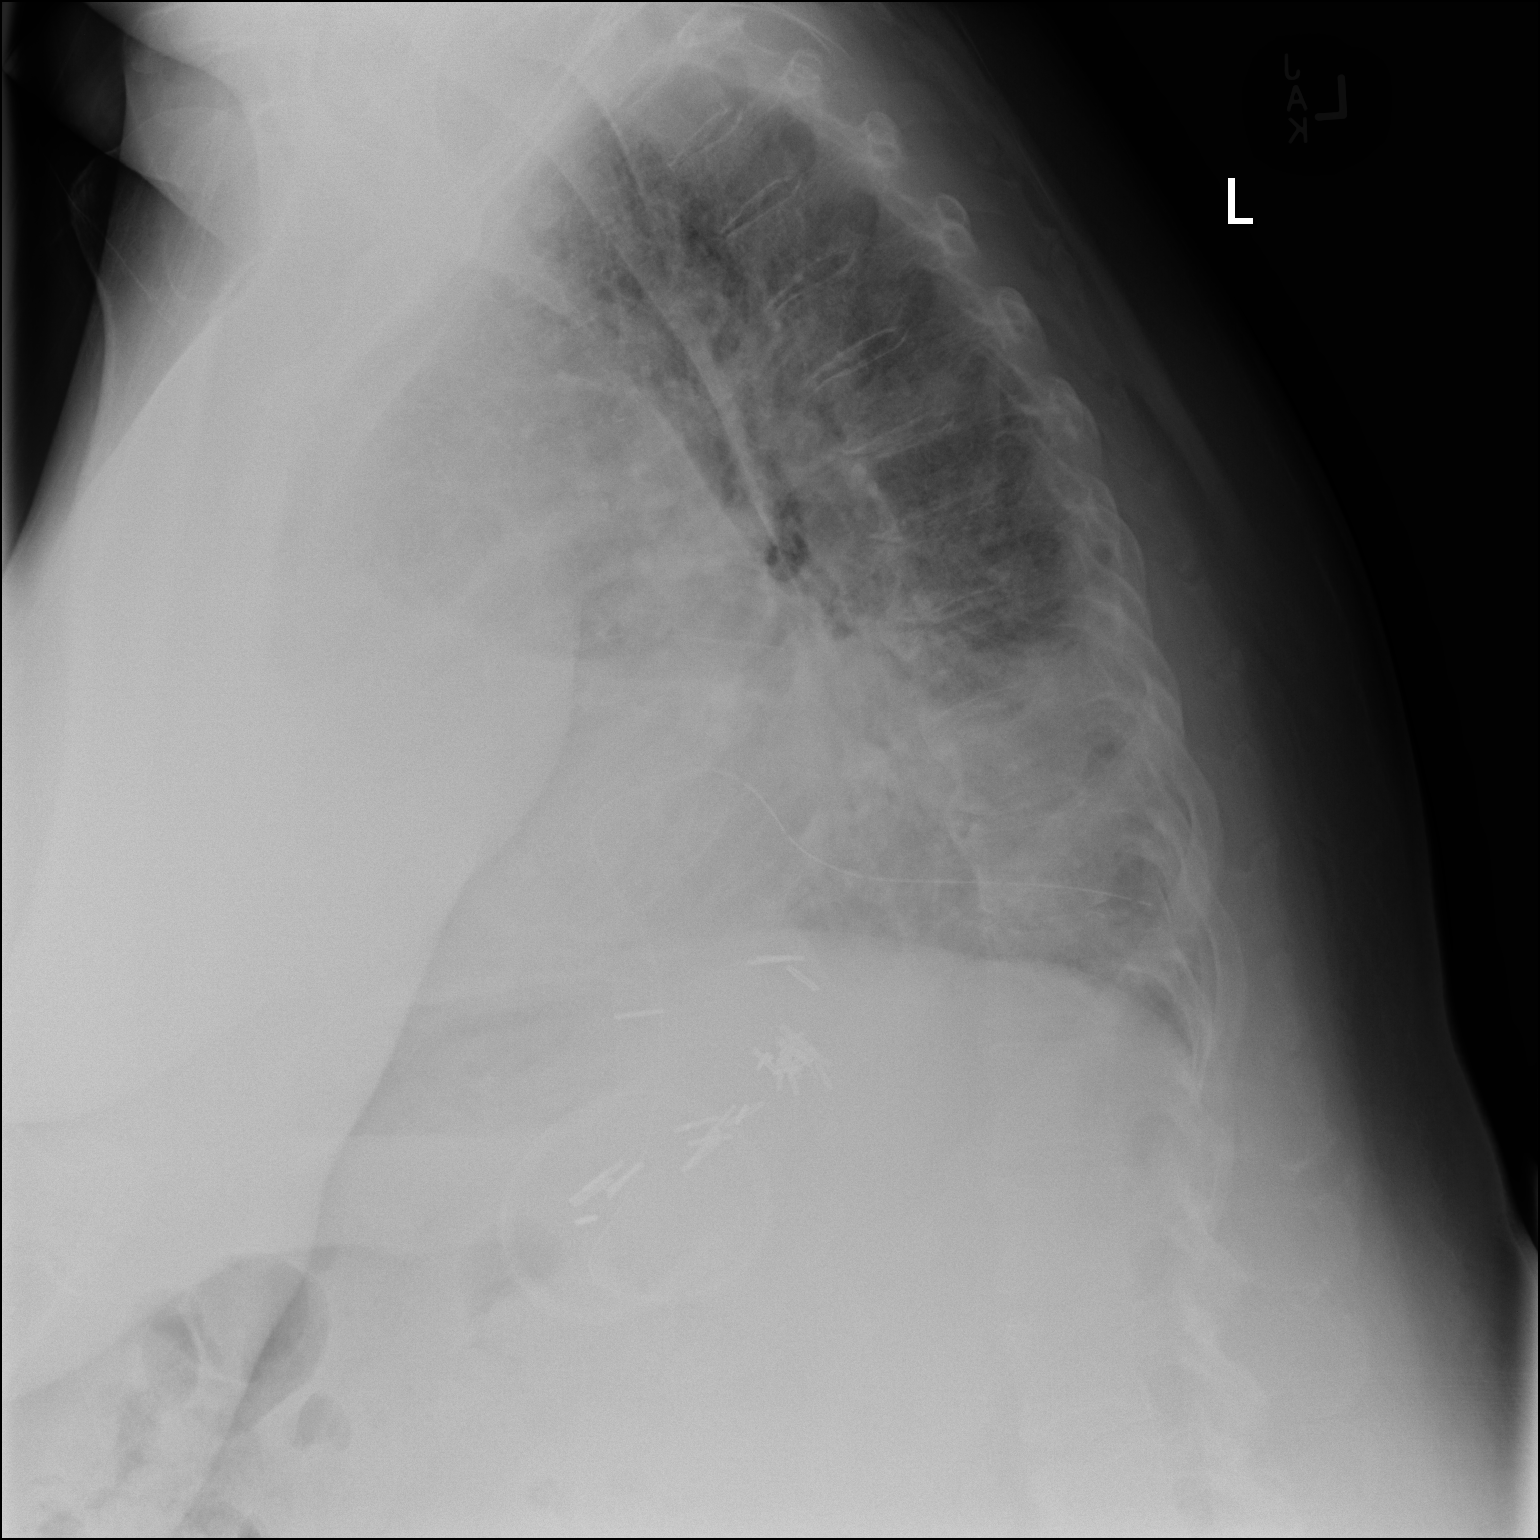

[2 of 2 positions shown; findings below may reference images not displayed]

FINDINGS: There is stable cardiomegaly and chronic vascular congestion.
Dependent left pleural effusion and indwelling left pleural drain
appear unchanged. The overall aeration of the lung bases is
improved. There is no pneumothorax or definite edema. The bones
appear unchanged. Surgical clips are present at the GE junction.
IMPRESSION: Overall improved aeration of the lung bases. Persistent left pleural
effusion, cardiomegaly and vascular congestion.

## 2019-06-21 IMAGING — DX DG CHEST 2V
2 series · 2 of 2 positions shown · non-contrast
Comparison: Chest x-ray of October 26, 2016.

CLINICAL DATA: Shortness of breath. History of COPD, CHF. No relief
with home nebulizer treatment. Patient has an indwelling pleural
space catheter on the left due to pleural effusion.

EXAM:
CHEST  2 VIEW

[chest lat]
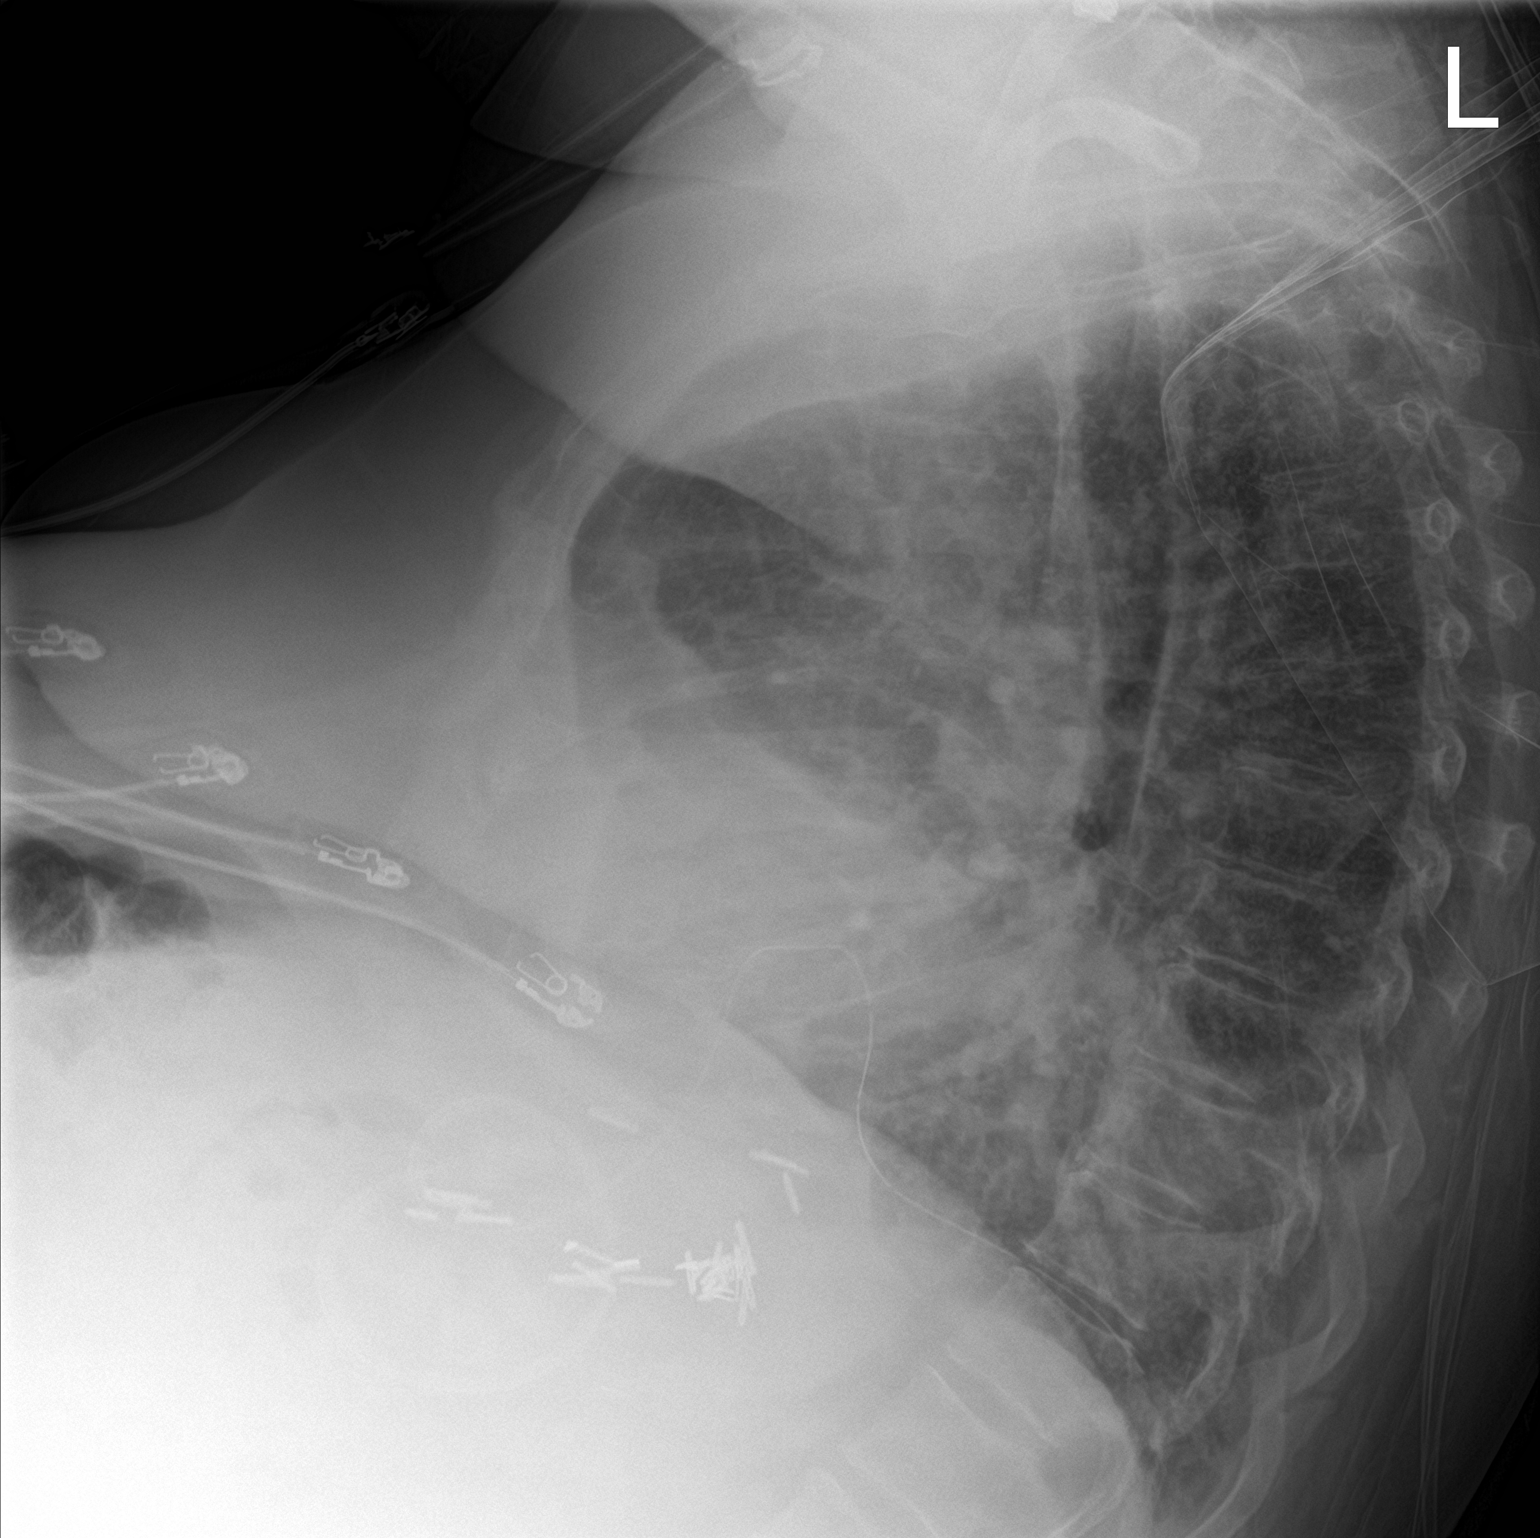

[chest ap]
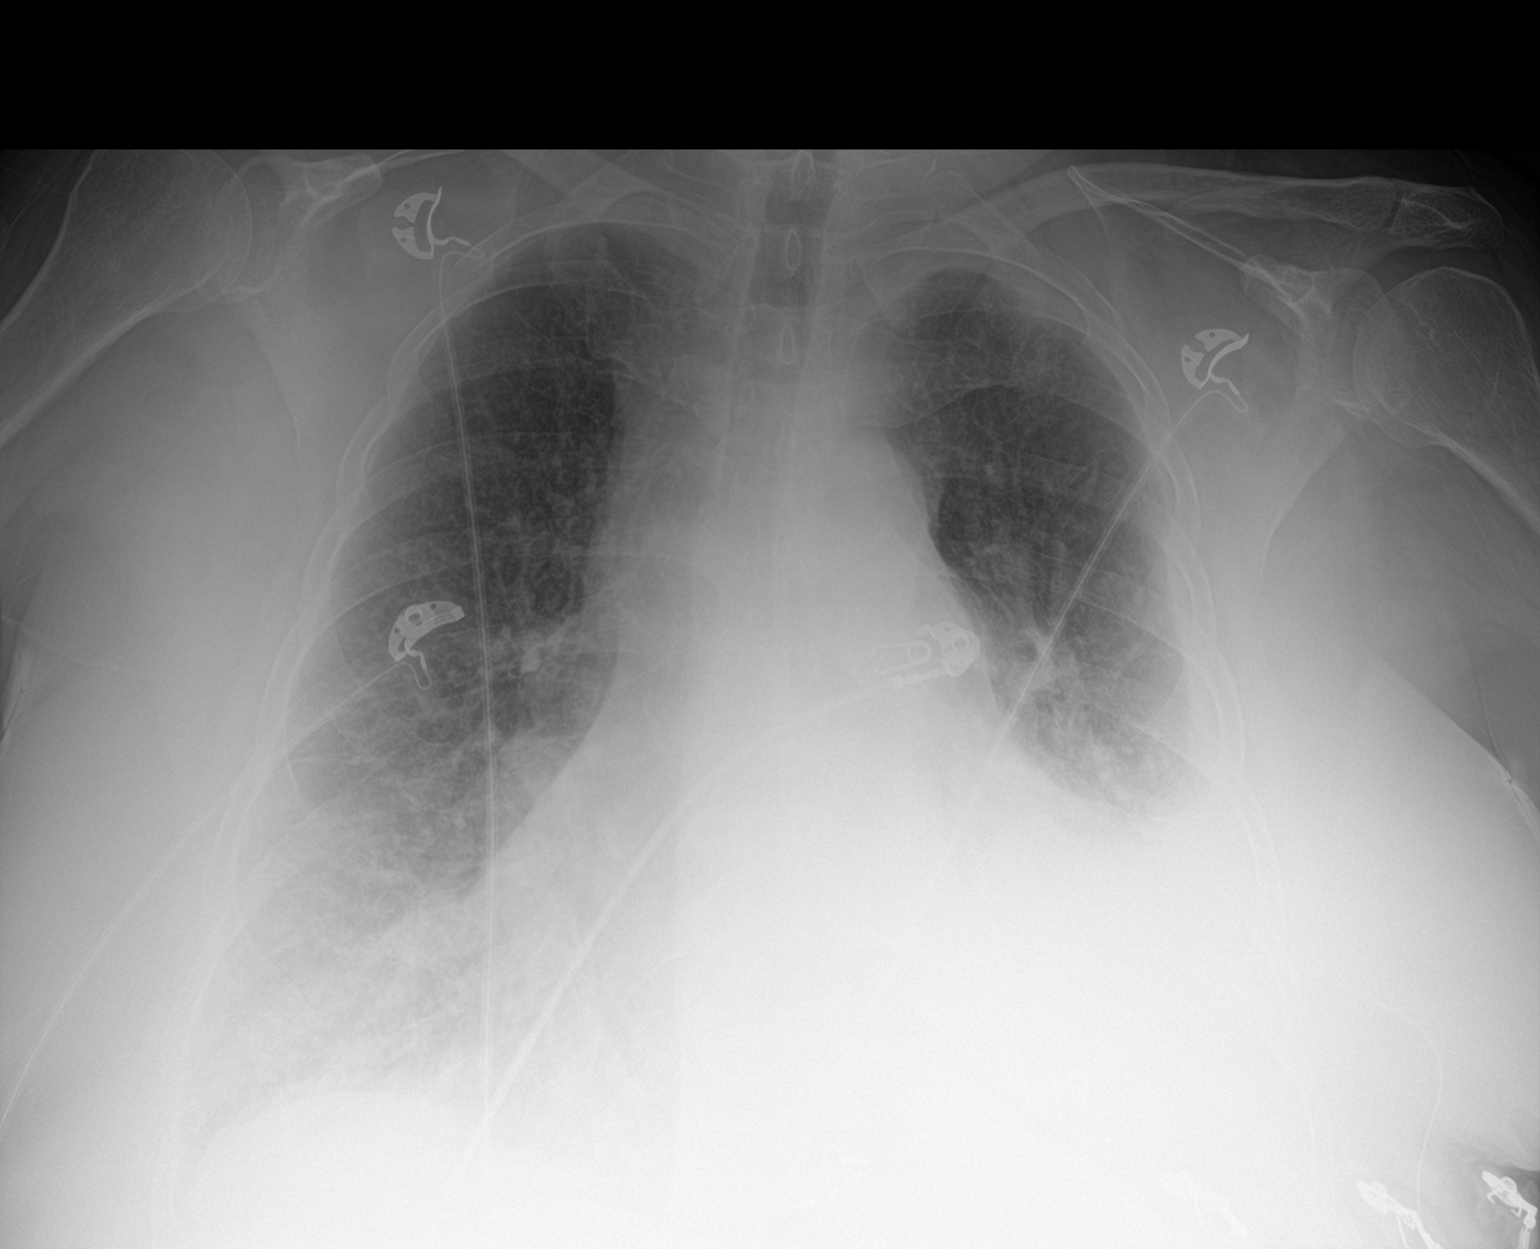

[2 of 2 positions shown; findings below may reference images not displayed]

FINDINGS: The right lung is well-expanded. The interstitial markings are
coarse and more conspicuous on the right today. On the left there is
a moderate-sized pleural effusion not greatly changed from the
previous study. The interstitial markings in the aerated portion of
the left lung are mildly increased. The cardiac silhouette is
enlarged. The pulmonary vascularity is indistinct.
IMPRESSION: CHF superimposed upon COPD or reactive airway disease. Slightly
increased interstitial edema greatest on the right is observed
today. Fairly stable moderate size left pleural effusion.

## 2019-06-22 IMAGING — CT CT CHEST W/O CM
2 of 3 series · 15 of 36 positions shown, 18 images · non-contrast
Comparison: 08/23/2016

CLINICAL DATA: COPD EXACERBATION.

EXAM:
CT CHEST WITHOUT CONTRAST
TECHNIQUE: Multidetector CT imaging of the chest was performed following the
standard protocol without IV contrast.

[Series 3: chest w/o 2mm st · axial · non-contrast · 0.83mm/px · z∈[+1161,+1457]mm · 12 of 174 slices shown, 15 images]
[im 13/174  mediastinal]
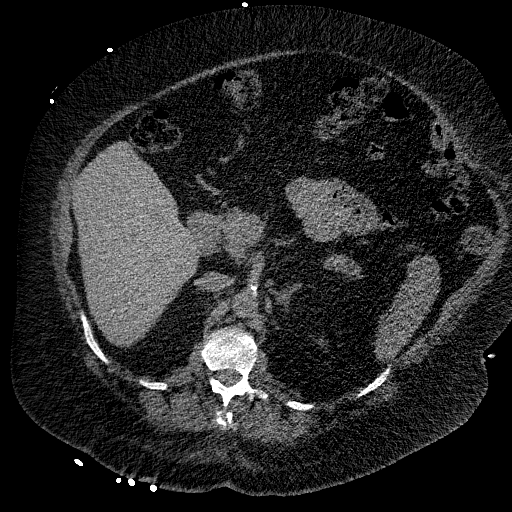
[im 13/174  lung]
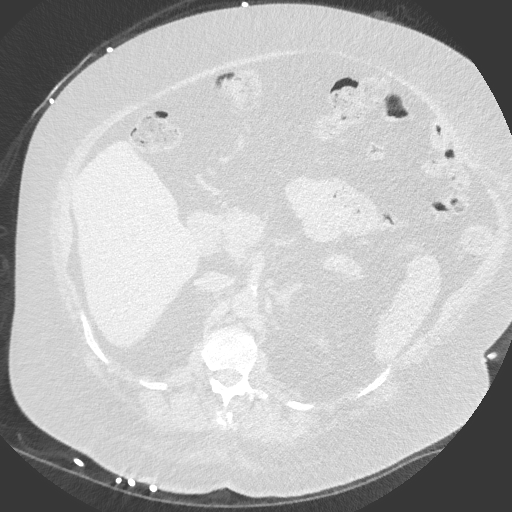
[im 26/174  lung]
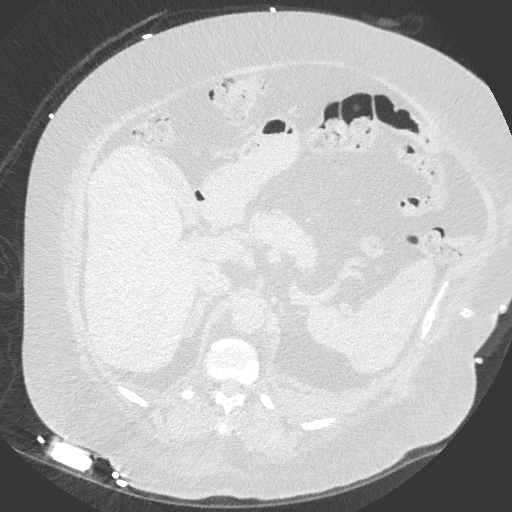
[im 39/174  lung]
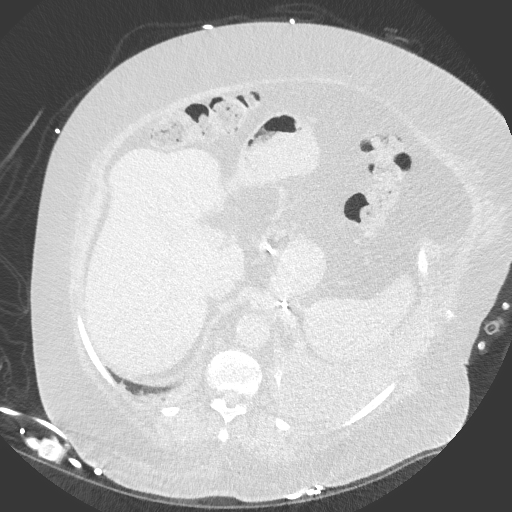
[im 52/174  lung]
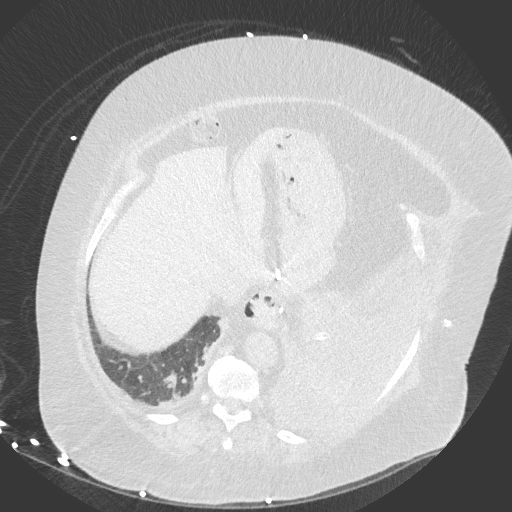
[im 65/174  mediastinal]
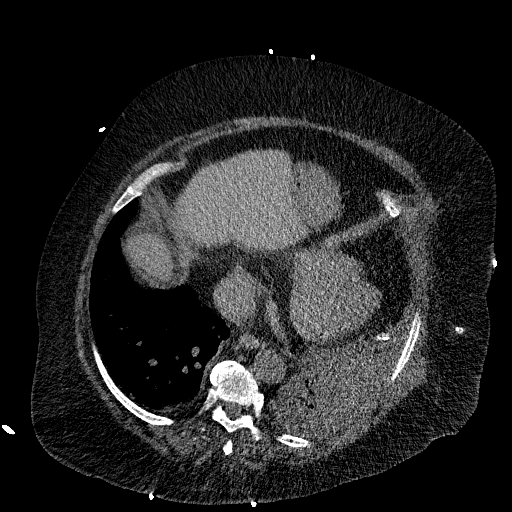
[im 65/174  lung]
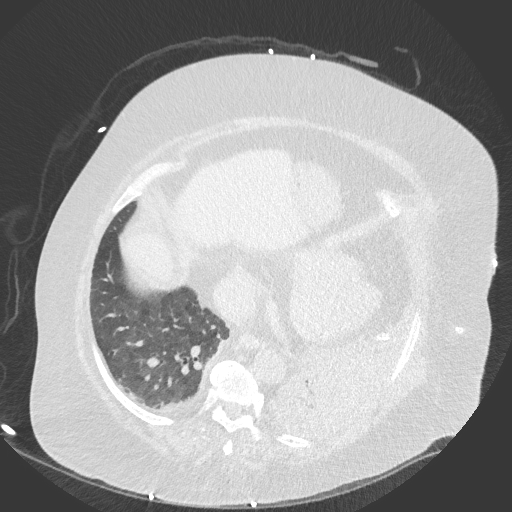
[im 77/174  lung]
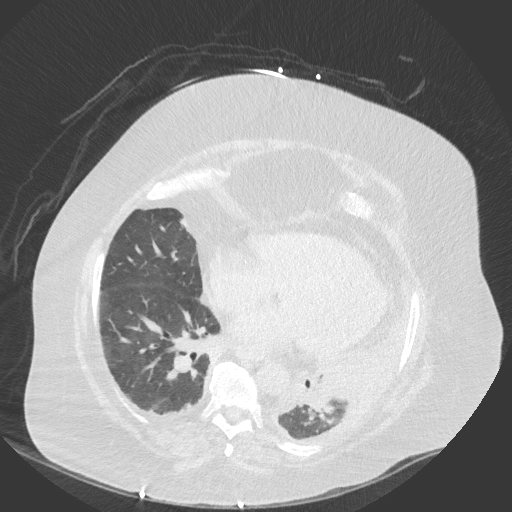
[im 97/174  lung]
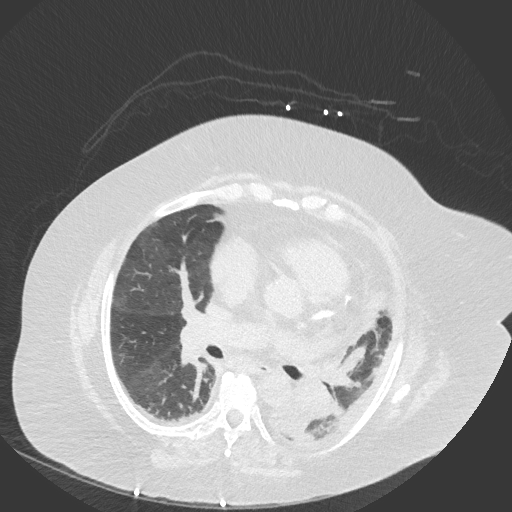
[im 109/174  lung]
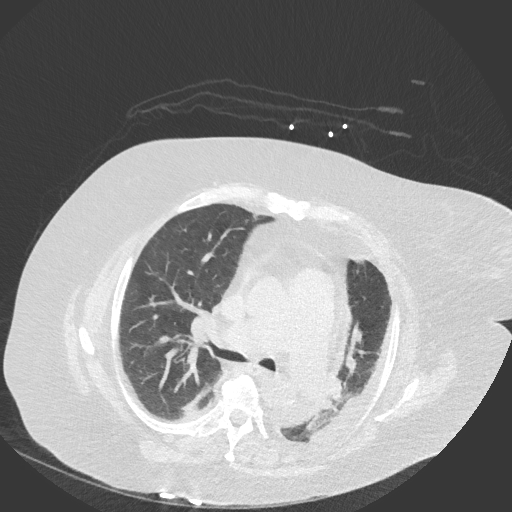
[im 122/174  mediastinal]
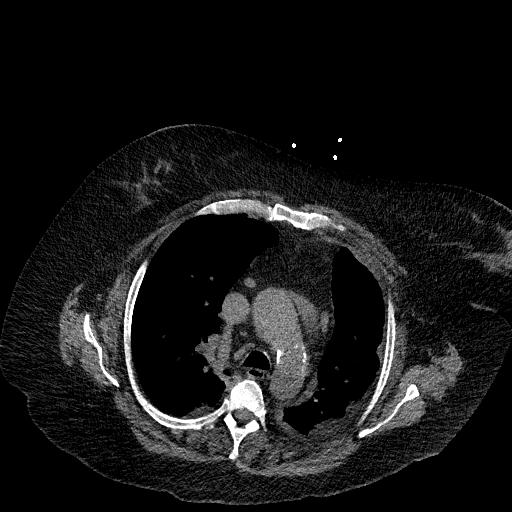
[im 122/174  lung]
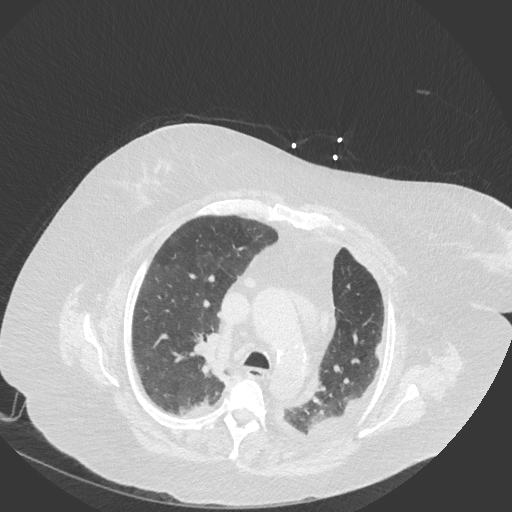
[im 135/174  lung]
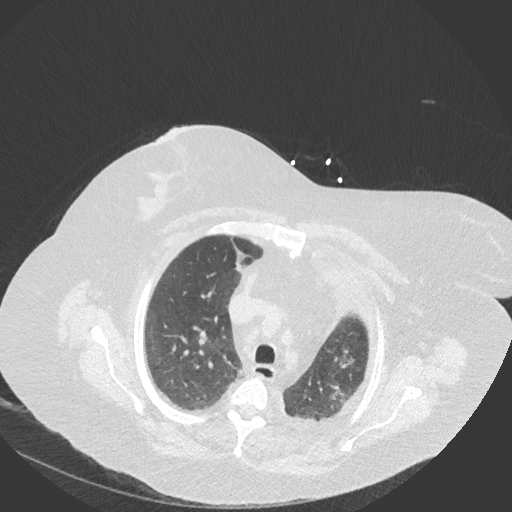
[im 148/174  lung]
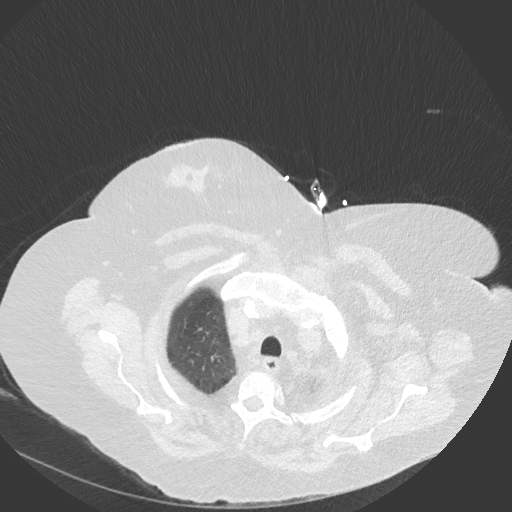
[im 161/174  lung]
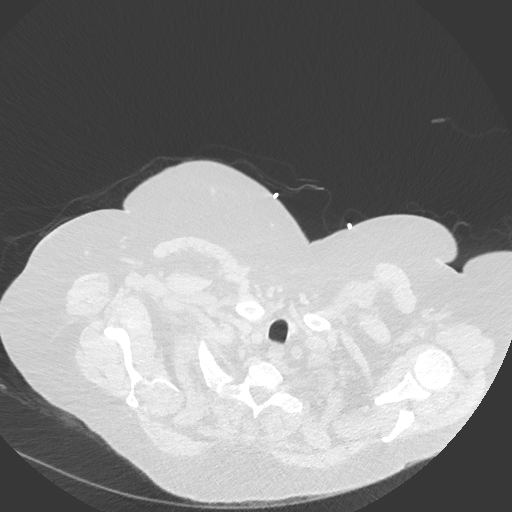

[Series 6: chest w/o 3mm st cor · coronal · non-contrast · 0.69mm/px · 3 of 134 slices shown]
[im 27/134  lung]
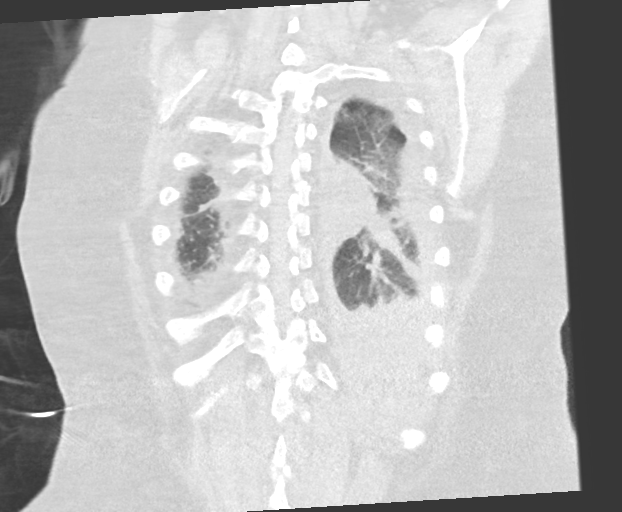
[im 54/134  lung]
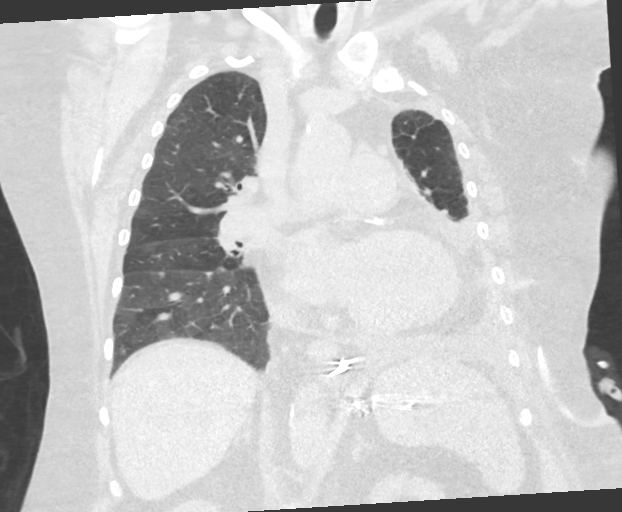
[im 80/134  lung]
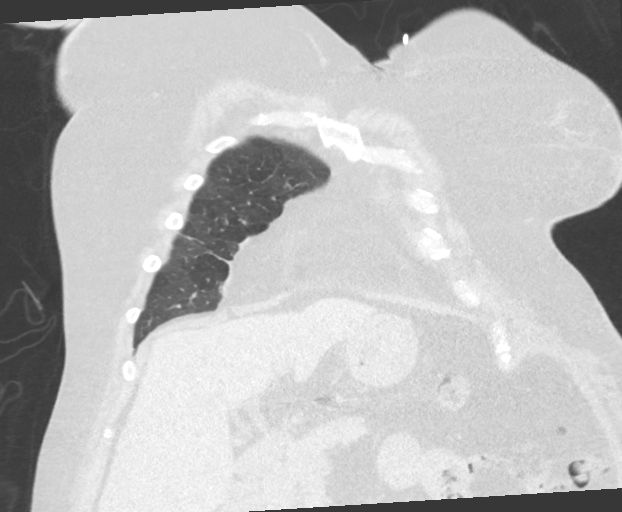

[15 of 36 positions shown; findings below may reference images not displayed]

FINDINGS: Cardiovascular: The heart is enlarged. Trace pericardial fluid or
thickening, similar to prior. Coronary artery calcification is
noted. Atherosclerotic calcification is noted in the wall of the
thoracic aorta.

Mediastinum/Nodes: Small subcarinal and inferior right hilar lymph
nodes are not substantially changed. 9 mm low right paratracheal
lymph node is similar to prior. Hilar regions not well evaluated
given lack of intravenous contrast material. There is no axillary
lymphadenopathy.

Lungs/Pleura: Left upper lobe pulmonary nodule measured previously
at 11 mm now measures 14 mm. 7 mm left apical nodule on the prior
study is now 12 mm. Interval progression of volume loss in the left
hemithorax left lower lung collapse/consolidation is similar to
prior. Left pleural effusion stable to minimally smaller with left
pleural drain visualized.

Upper Abdomen: Surgical clips noted left upper abdomen.

Musculoskeletal: Multiple posterior left rib fractures again noted.
IMPRESSION: 1. Interval progression of left upper lung pulmonary nodules.
2. Stable volume loss left hemithorax with stable to minimally
decreased left pleural fluid collection with pleural drain
visualized in situ.
3. Left base collapse/consolidation, similar to prior.

## 2019-06-24 IMAGING — DX DG CHEST 2V
2 series · 2 of 2 positions shown · non-contrast
Comparison: Chest x-ray of November 14, 2016 and chest CT scan of
November 15, 2016.

CLINICAL DATA: Shortness of breath following chest tube removal.

EXAM:
CHEST  2 VIEW

[x chest ap]
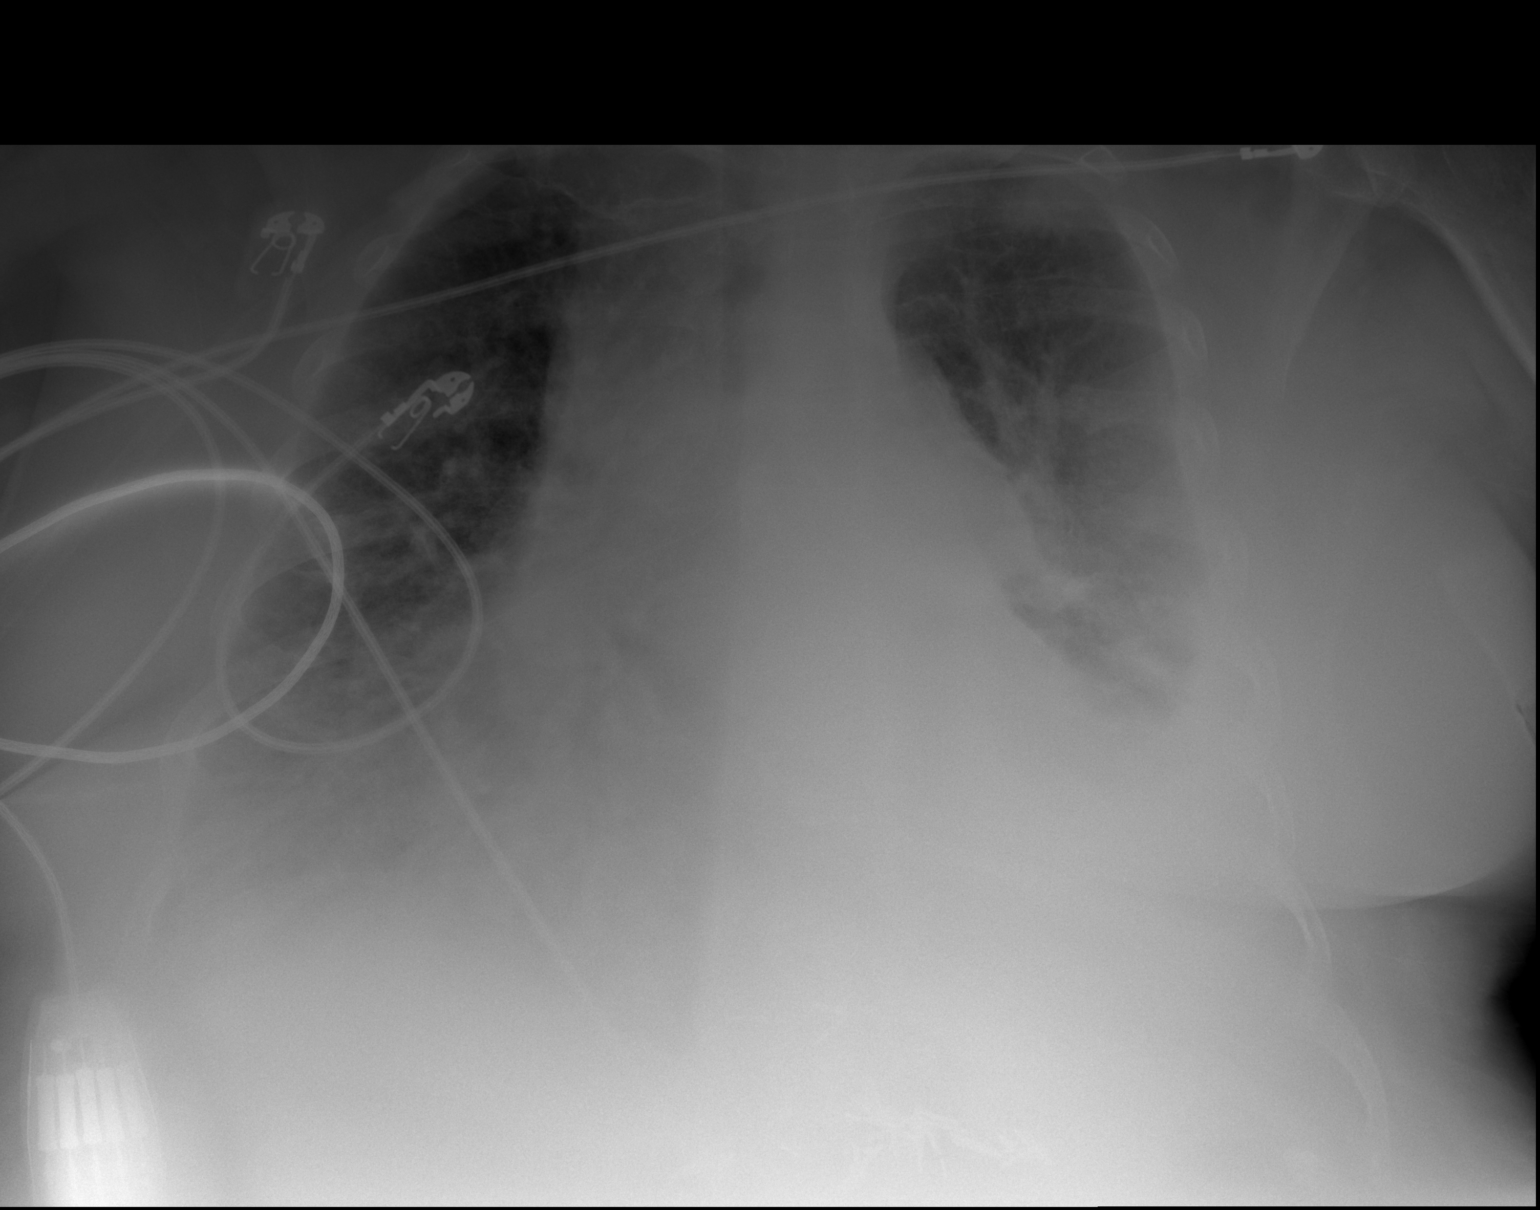

[w chest lat]
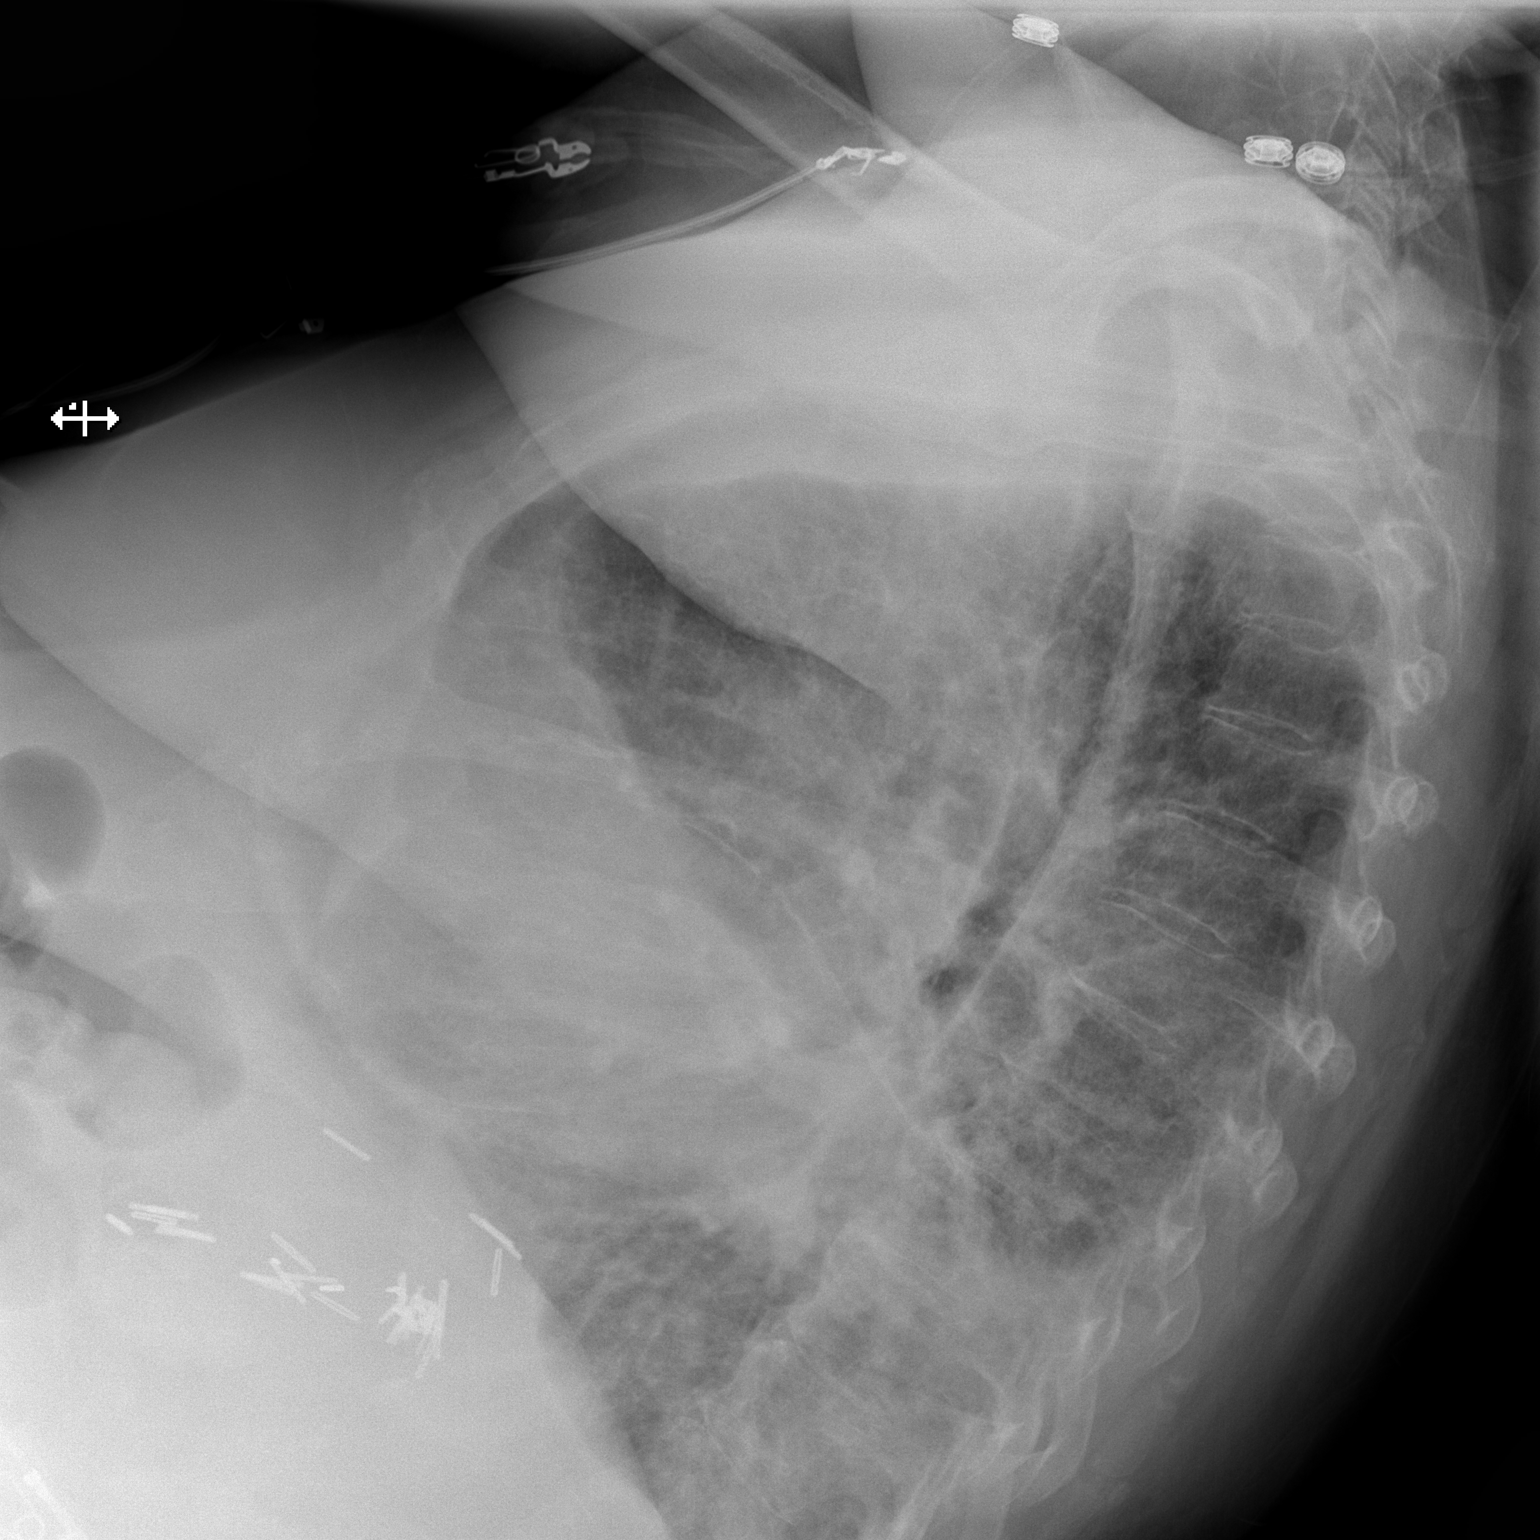

[2 of 2 positions shown; findings below may reference images not displayed]

FINDINGS: Motion artifact limits the frontal chest x-ray. There remains a
small or moderate size left pleural effusion. The hemidiaphragm is
obscured on the left. On the right the hemidiaphragm is faintly
visible. The cardiac silhouette is enlarged. The pulmonary
vascularity is engorged. The interstitial markings remain increased.
The bony thorax exhibits no acute abnormality.
IMPRESSION: CHF with pulmonary interstitial edema and moderate size left pleural
effusion. There has not been significant change in the volume of
pleural fluid on the left since removal of the pleural space
catheter. There is no pneumothorax.

## 2019-06-29 IMAGING — DX DG CHEST 1V PORT
1 series · 1 of 1 positions shown · non-contrast
Comparison: Radiographs November 17, 2016.

CLINICAL DATA: Shortness of breath.

EXAM:
PORTABLE CHEST 1 VIEW

[chest ap]
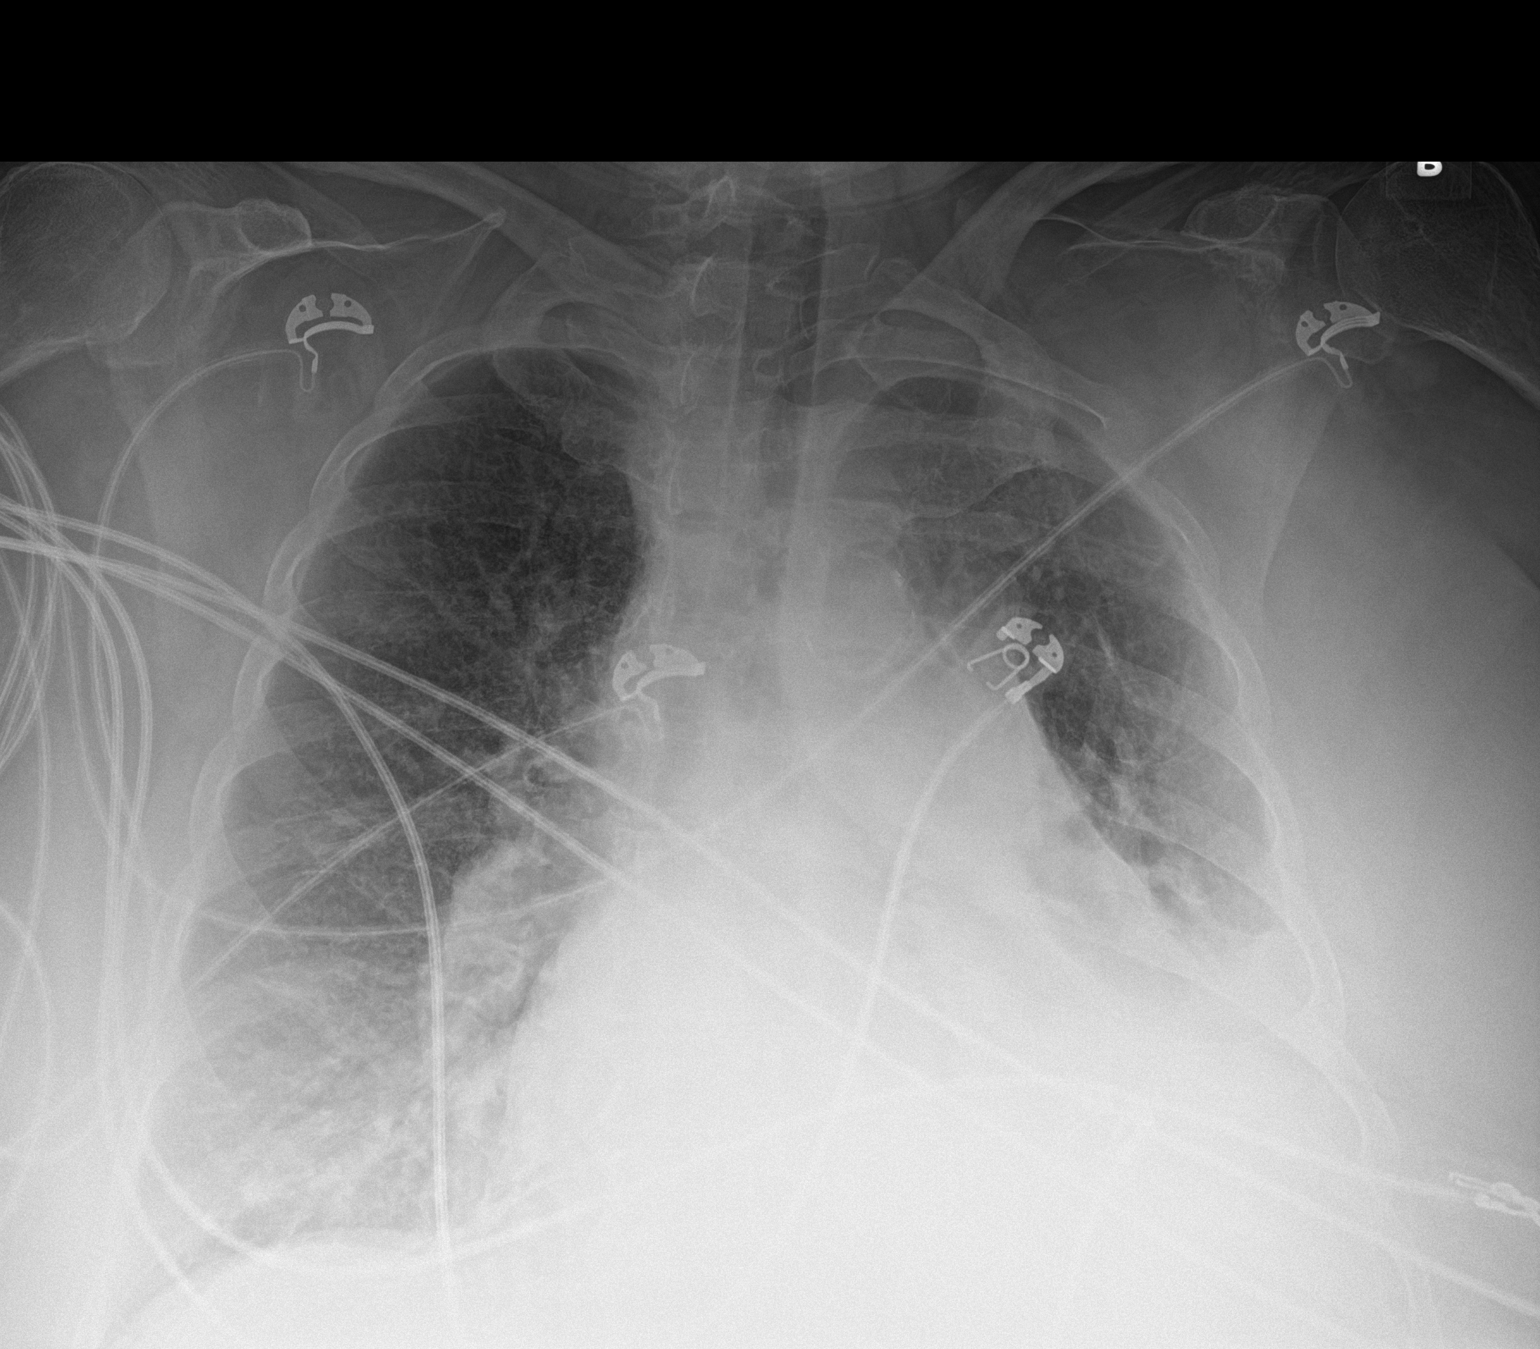

[1 of 1 positions shown; findings below may reference images not displayed]

FINDINGS: Stable cardiomegaly with central pulmonary vascular congestion.
Atherosclerosis of thoracic aorta is noted. No pneumothorax is
noted. Mild bilateral perihilar and basilar pulmonary edema is
noted. Mild left pleural effusion is noted with probable associated
atelectasis. Bony thorax is unremarkable.
IMPRESSION: Stable cardiomegaly with central pulmonary vascular congestion.
Aortic atherosclerosis. Mild bilateral pulmonary edema is noted with
mild left pleural effusion and associated atelectasis.

## 2019-07-01 IMAGING — DX DG CHEST 1V PORT
1 series · 1 of 1 positions shown · non-contrast
Comparison: 11/22/2016

CLINICAL DATA: Pleural effusion.  Hypoxia.

EXAM:
PORTABLE CHEST 1 VIEW

[chest ap]
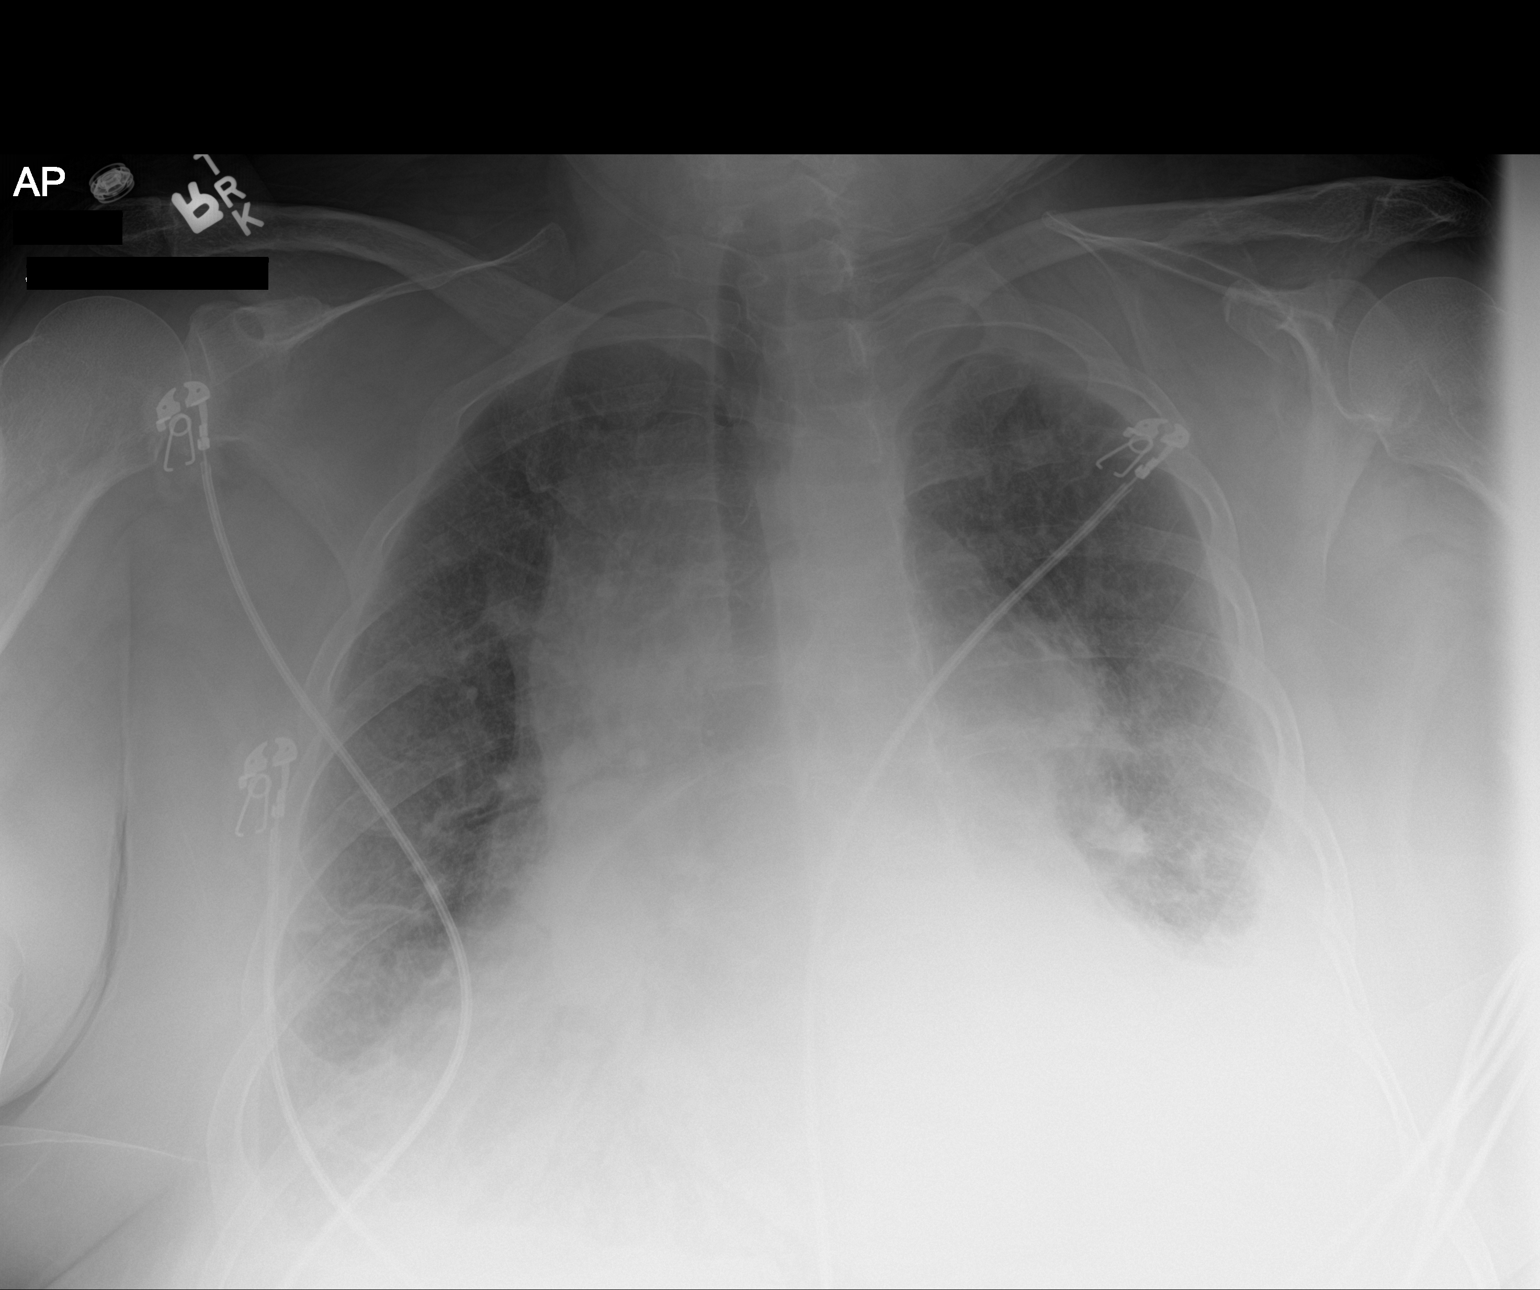

[1 of 1 positions shown; findings below may reference images not displayed]

FINDINGS: Bilateral diffuse interstitial thickening. Small bilateral pleural
effusions, left greater than right. No pneumothorax. Stable
cardiomegaly.
IMPRESSION: Findings concerning for CHF.
